# Patient Record
Sex: Male | Born: 1960
Health system: Southern US, Community
[De-identification: ages and names within clinical notes are randomized; demographics above are authoritative.]

## PROBLEM LIST (undated history)

## (undated) DIAGNOSIS — E119 Type 2 diabetes mellitus without complications: Secondary | ICD-10-CM

## (undated) DIAGNOSIS — N186 End stage renal disease: Secondary | ICD-10-CM

## (undated) DIAGNOSIS — I482 Chronic atrial fibrillation, unspecified: Secondary | ICD-10-CM

## (undated) DIAGNOSIS — N189 Chronic kidney disease, unspecified: Secondary | ICD-10-CM

## (undated) DIAGNOSIS — I509 Heart failure, unspecified: Secondary | ICD-10-CM

## (undated) DIAGNOSIS — E785 Hyperlipidemia, unspecified: Secondary | ICD-10-CM

## (undated) DIAGNOSIS — Z992 Dependence on renal dialysis: Secondary | ICD-10-CM

## (undated) DIAGNOSIS — I10 Essential (primary) hypertension: Secondary | ICD-10-CM

## (undated) DIAGNOSIS — T884XXA Failed or difficult intubation, initial encounter: Secondary | ICD-10-CM

## (undated) DIAGNOSIS — L03116 Cellulitis of left lower limb: Secondary | ICD-10-CM

## (undated) DIAGNOSIS — E118 Type 2 diabetes mellitus with unspecified complications: Secondary | ICD-10-CM

## (undated) DIAGNOSIS — D631 Anemia in chronic kidney disease: Secondary | ICD-10-CM

## (undated) HISTORY — DX: Essential (primary) hypertension: I10

## (undated) HISTORY — DX: Type 2 diabetes mellitus without complications: E11.9

## (undated) HISTORY — PX: WOUND DEBRIDEMENT: SHX247

## (undated) HISTORY — DX: Hyperlipidemia, unspecified: E78.5

## (undated) HISTORY — DX: Chronic kidney disease, unspecified: N18.9

## (undated) HISTORY — DX: Heart failure, unspecified: I50.9

---

## 2014-01-09 IMAGING — CR L-SPINE 2-3 VWS
1 series · 3 of 3 positions shown · non-contrast
Comparison: No prior study available for comparison.

HISTORY: Back pain. Disability evaluation.
TECHNIQUE: Lumbar spine, 3 views

[Series 1: view not recorded · 0.17mm/px · 3 of 3 slices shown]
[im 1/3]
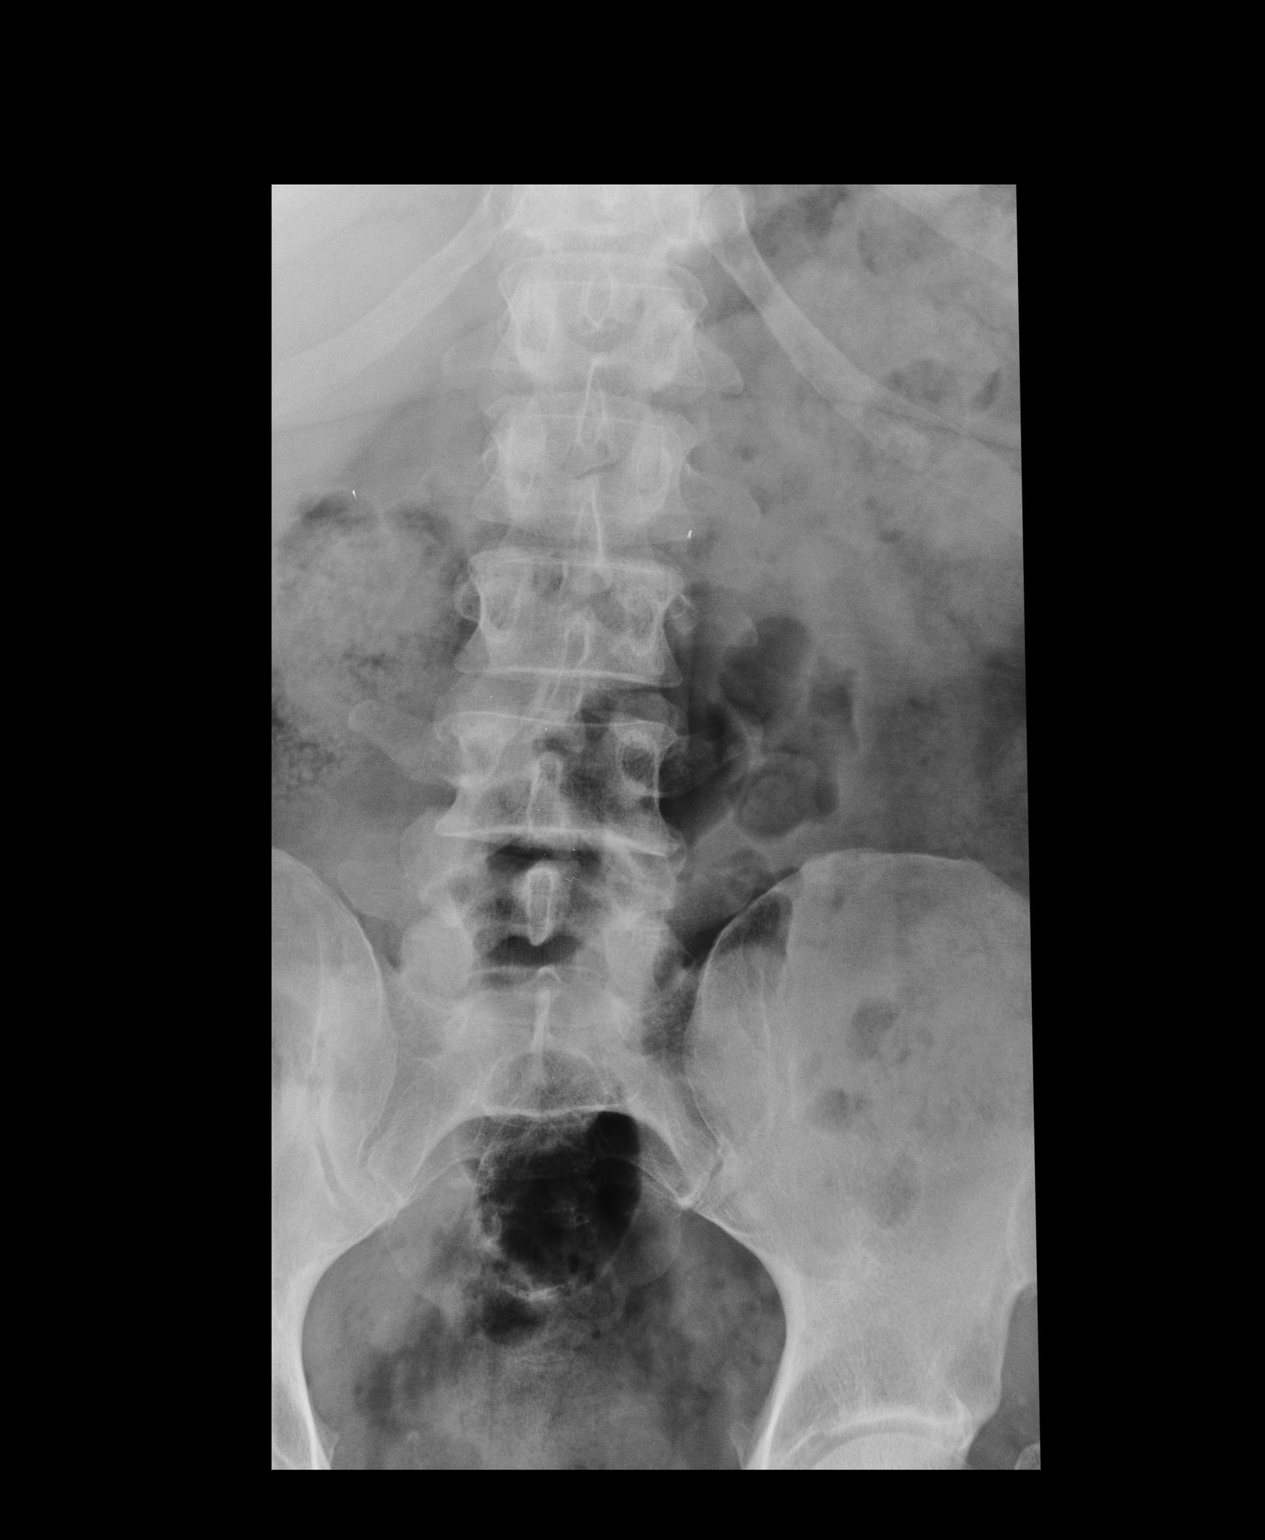
[im 2/3]
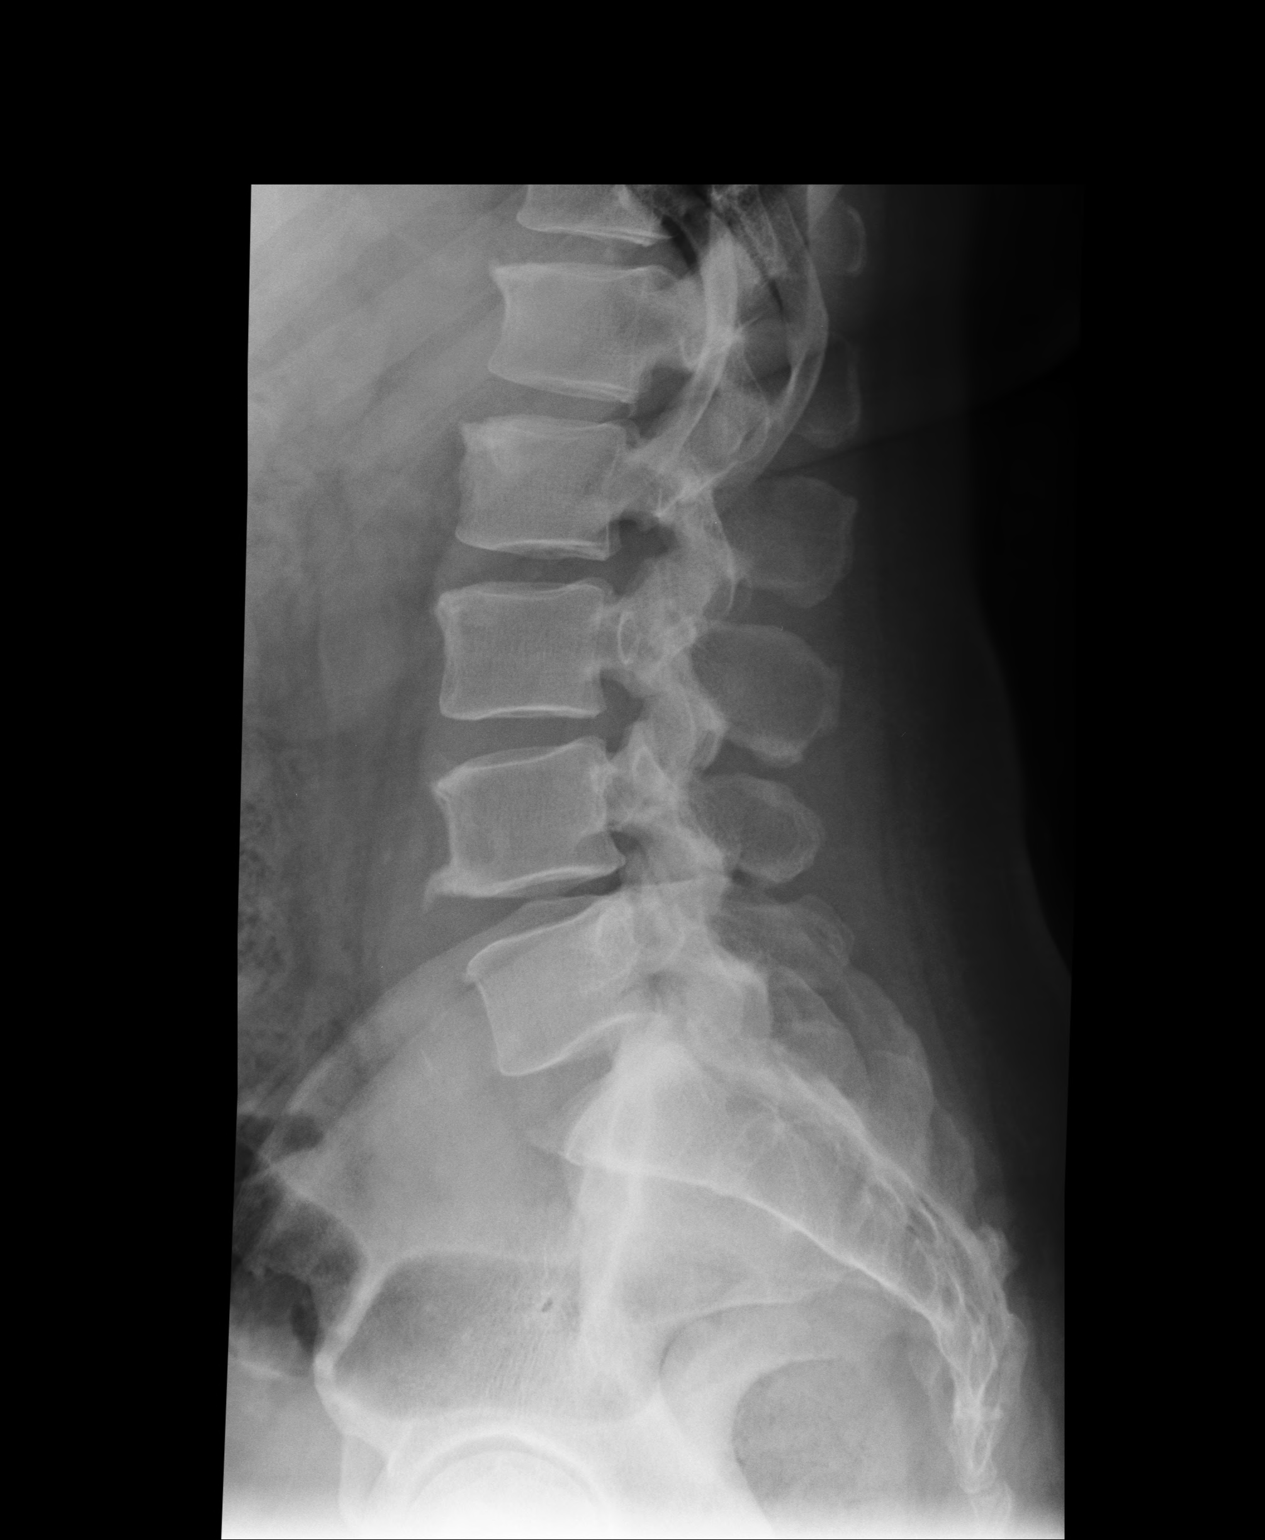
[im 3/3]
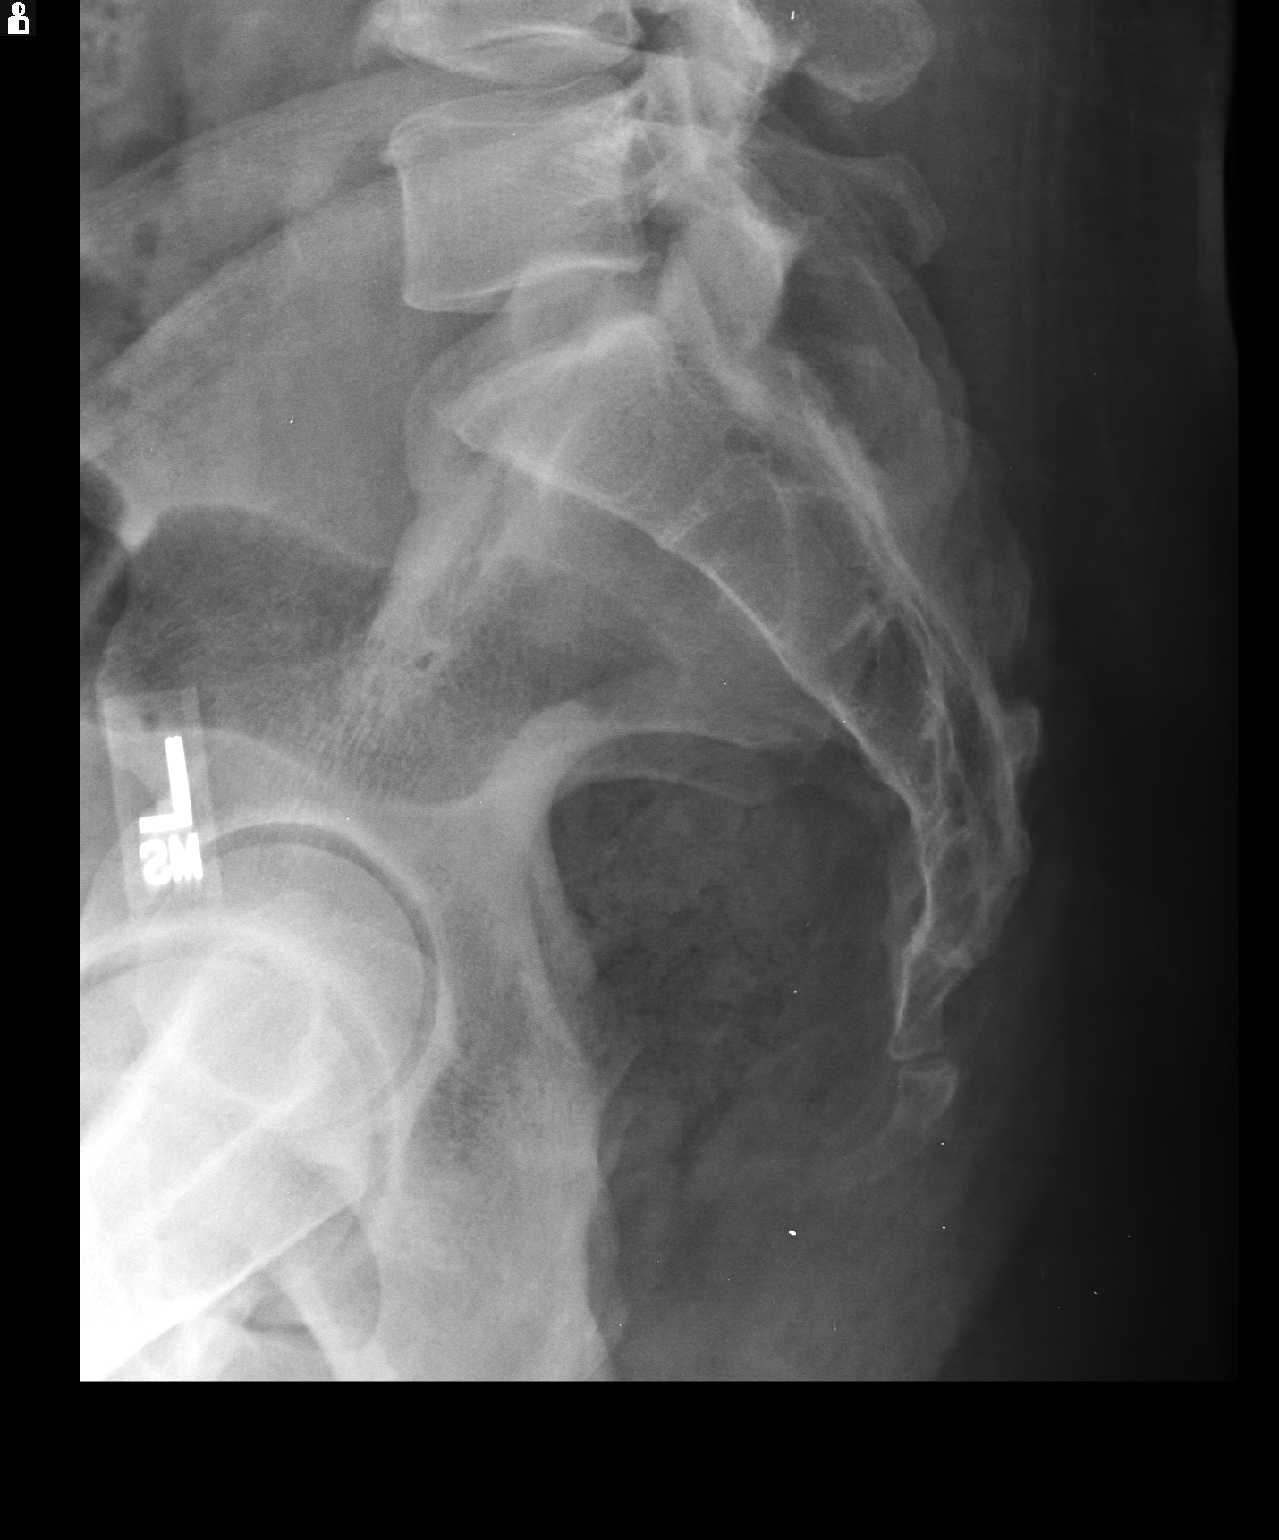

[3 of 3 positions shown; findings below may reference images not displayed]

FINDINGS: [<There are five lumbar segments.  There is no fracture, spondylosis or spondylolisthesis.  The disc spaces notable for anterior endplate spurring but maintenance of disc space at T12-L1, L1-2 and L4-5 consistent spondylosis deformans. Remaining exam appears within normal limits.
IMPRESSION: 1. Spondylosis deformans of the lower thoracic, upper lumbar and lower lumbar spine as described.

2. No convincing fractures or evidence for significant arthritic change otherwise.

## 2019-07-25 DIAGNOSIS — D631 Anemia in chronic kidney disease: Secondary | ICD-10-CM | POA: Insufficient documentation

## 2019-07-25 DIAGNOSIS — I739 Peripheral vascular disease, unspecified: Secondary | ICD-10-CM | POA: Insufficient documentation

## 2019-07-25 DIAGNOSIS — N189 Chronic kidney disease, unspecified: Secondary | ICD-10-CM | POA: Insufficient documentation

## 2019-07-25 DIAGNOSIS — N2581 Secondary hyperparathyroidism of renal origin: Secondary | ICD-10-CM | POA: Insufficient documentation

## 2019-07-25 DIAGNOSIS — N186 End stage renal disease: Secondary | ICD-10-CM | POA: Insufficient documentation

## 2019-09-01 DIAGNOSIS — I132 Hypertensive heart and chronic kidney disease with heart failure and with stage 5 chronic kidney disease, or end stage renal disease: Secondary | ICD-10-CM | POA: Insufficient documentation

## 2019-09-01 DIAGNOSIS — E669 Obesity, unspecified: Secondary | ICD-10-CM | POA: Insufficient documentation

## 2019-09-01 DIAGNOSIS — N185 Chronic kidney disease, stage 5: Secondary | ICD-10-CM | POA: Insufficient documentation

## 2019-09-01 DIAGNOSIS — I2510 Atherosclerotic heart disease of native coronary artery without angina pectoris: Secondary | ICD-10-CM | POA: Insufficient documentation

## 2019-09-14 DIAGNOSIS — Z23 Encounter for immunization: Secondary | ICD-10-CM | POA: Diagnosis not present

## 2019-09-14 DIAGNOSIS — E8770 Fluid overload, unspecified: Secondary | ICD-10-CM | POA: Diagnosis not present

## 2019-09-14 DIAGNOSIS — D689 Coagulation defect, unspecified: Secondary | ICD-10-CM | POA: Diagnosis not present

## 2019-09-14 DIAGNOSIS — N186 End stage renal disease: Secondary | ICD-10-CM | POA: Diagnosis not present

## 2019-09-14 DIAGNOSIS — Z992 Dependence on renal dialysis: Secondary | ICD-10-CM | POA: Diagnosis not present

## 2019-09-14 DIAGNOSIS — N2581 Secondary hyperparathyroidism of renal origin: Secondary | ICD-10-CM | POA: Diagnosis not present

## 2019-09-16 DIAGNOSIS — E8770 Fluid overload, unspecified: Secondary | ICD-10-CM | POA: Diagnosis not present

## 2019-09-16 DIAGNOSIS — Z992 Dependence on renal dialysis: Secondary | ICD-10-CM | POA: Diagnosis not present

## 2019-09-16 DIAGNOSIS — Z23 Encounter for immunization: Secondary | ICD-10-CM | POA: Diagnosis not present

## 2019-09-16 DIAGNOSIS — N186 End stage renal disease: Secondary | ICD-10-CM | POA: Diagnosis not present

## 2019-09-16 DIAGNOSIS — D689 Coagulation defect, unspecified: Secondary | ICD-10-CM | POA: Diagnosis not present

## 2019-09-16 DIAGNOSIS — N2581 Secondary hyperparathyroidism of renal origin: Secondary | ICD-10-CM | POA: Diagnosis not present

## 2019-09-19 DIAGNOSIS — Z23 Encounter for immunization: Secondary | ICD-10-CM | POA: Diagnosis not present

## 2019-09-19 DIAGNOSIS — D689 Coagulation defect, unspecified: Secondary | ICD-10-CM | POA: Diagnosis not present

## 2019-09-19 DIAGNOSIS — Z992 Dependence on renal dialysis: Secondary | ICD-10-CM | POA: Diagnosis not present

## 2019-09-19 DIAGNOSIS — N2581 Secondary hyperparathyroidism of renal origin: Secondary | ICD-10-CM | POA: Diagnosis not present

## 2019-09-19 DIAGNOSIS — N186 End stage renal disease: Secondary | ICD-10-CM | POA: Diagnosis not present

## 2019-09-19 DIAGNOSIS — E8770 Fluid overload, unspecified: Secondary | ICD-10-CM | POA: Diagnosis not present

## 2019-09-20 DIAGNOSIS — N2581 Secondary hyperparathyroidism of renal origin: Secondary | ICD-10-CM | POA: Diagnosis not present

## 2019-09-20 DIAGNOSIS — N186 End stage renal disease: Secondary | ICD-10-CM | POA: Diagnosis not present

## 2019-09-20 DIAGNOSIS — E8770 Fluid overload, unspecified: Secondary | ICD-10-CM | POA: Diagnosis not present

## 2019-09-20 DIAGNOSIS — Z992 Dependence on renal dialysis: Secondary | ICD-10-CM | POA: Diagnosis not present

## 2019-09-20 DIAGNOSIS — E8779 Other fluid overload: Secondary | ICD-10-CM | POA: Diagnosis not present

## 2019-09-23 DIAGNOSIS — D689 Coagulation defect, unspecified: Secondary | ICD-10-CM | POA: Diagnosis not present

## 2019-09-23 DIAGNOSIS — E8770 Fluid overload, unspecified: Secondary | ICD-10-CM | POA: Diagnosis not present

## 2019-09-23 DIAGNOSIS — N2581 Secondary hyperparathyroidism of renal origin: Secondary | ICD-10-CM | POA: Diagnosis not present

## 2019-09-23 DIAGNOSIS — Z23 Encounter for immunization: Secondary | ICD-10-CM | POA: Diagnosis not present

## 2019-09-23 DIAGNOSIS — Z992 Dependence on renal dialysis: Secondary | ICD-10-CM | POA: Diagnosis not present

## 2019-09-23 DIAGNOSIS — N186 End stage renal disease: Secondary | ICD-10-CM | POA: Diagnosis not present

## 2019-09-25 DIAGNOSIS — Z992 Dependence on renal dialysis: Secondary | ICD-10-CM | POA: Diagnosis not present

## 2019-09-25 DIAGNOSIS — N186 End stage renal disease: Secondary | ICD-10-CM | POA: Diagnosis not present

## 2019-09-25 DIAGNOSIS — E8770 Fluid overload, unspecified: Secondary | ICD-10-CM | POA: Diagnosis not present

## 2019-09-25 DIAGNOSIS — E8779 Other fluid overload: Secondary | ICD-10-CM | POA: Diagnosis not present

## 2019-09-25 DIAGNOSIS — N2581 Secondary hyperparathyroidism of renal origin: Secondary | ICD-10-CM | POA: Diagnosis not present

## 2019-09-26 DIAGNOSIS — D689 Coagulation defect, unspecified: Secondary | ICD-10-CM | POA: Diagnosis not present

## 2019-09-26 DIAGNOSIS — Z992 Dependence on renal dialysis: Secondary | ICD-10-CM | POA: Diagnosis not present

## 2019-09-26 DIAGNOSIS — Z23 Encounter for immunization: Secondary | ICD-10-CM | POA: Diagnosis not present

## 2019-09-26 DIAGNOSIS — N2581 Secondary hyperparathyroidism of renal origin: Secondary | ICD-10-CM | POA: Diagnosis not present

## 2019-09-26 DIAGNOSIS — E8770 Fluid overload, unspecified: Secondary | ICD-10-CM | POA: Diagnosis not present

## 2019-09-26 DIAGNOSIS — N186 End stage renal disease: Secondary | ICD-10-CM | POA: Diagnosis not present

## 2019-09-28 DIAGNOSIS — D689 Coagulation defect, unspecified: Secondary | ICD-10-CM | POA: Diagnosis not present

## 2019-09-28 DIAGNOSIS — E8770 Fluid overload, unspecified: Secondary | ICD-10-CM | POA: Diagnosis not present

## 2019-09-28 DIAGNOSIS — Z23 Encounter for immunization: Secondary | ICD-10-CM | POA: Diagnosis not present

## 2019-09-28 DIAGNOSIS — N186 End stage renal disease: Secondary | ICD-10-CM | POA: Diagnosis not present

## 2019-09-28 DIAGNOSIS — Z992 Dependence on renal dialysis: Secondary | ICD-10-CM | POA: Diagnosis not present

## 2019-09-28 DIAGNOSIS — N2581 Secondary hyperparathyroidism of renal origin: Secondary | ICD-10-CM | POA: Diagnosis not present

## 2019-09-29 DIAGNOSIS — E8779 Other fluid overload: Secondary | ICD-10-CM | POA: Diagnosis not present

## 2019-09-29 DIAGNOSIS — N2581 Secondary hyperparathyroidism of renal origin: Secondary | ICD-10-CM | POA: Diagnosis not present

## 2019-09-29 DIAGNOSIS — E8770 Fluid overload, unspecified: Secondary | ICD-10-CM | POA: Diagnosis not present

## 2019-09-29 DIAGNOSIS — Z992 Dependence on renal dialysis: Secondary | ICD-10-CM | POA: Diagnosis not present

## 2019-09-29 DIAGNOSIS — N186 End stage renal disease: Secondary | ICD-10-CM | POA: Diagnosis not present

## 2019-09-30 DIAGNOSIS — D689 Coagulation defect, unspecified: Secondary | ICD-10-CM | POA: Diagnosis not present

## 2019-09-30 DIAGNOSIS — Z992 Dependence on renal dialysis: Secondary | ICD-10-CM | POA: Diagnosis not present

## 2019-09-30 DIAGNOSIS — N186 End stage renal disease: Secondary | ICD-10-CM | POA: Diagnosis not present

## 2019-09-30 DIAGNOSIS — N2581 Secondary hyperparathyroidism of renal origin: Secondary | ICD-10-CM | POA: Diagnosis not present

## 2019-09-30 DIAGNOSIS — Z23 Encounter for immunization: Secondary | ICD-10-CM | POA: Diagnosis not present

## 2019-09-30 DIAGNOSIS — E8770 Fluid overload, unspecified: Secondary | ICD-10-CM | POA: Diagnosis not present

## 2019-10-02 DIAGNOSIS — D689 Coagulation defect, unspecified: Secondary | ICD-10-CM | POA: Diagnosis not present

## 2019-10-02 DIAGNOSIS — Z992 Dependence on renal dialysis: Secondary | ICD-10-CM | POA: Diagnosis not present

## 2019-10-02 DIAGNOSIS — N186 End stage renal disease: Secondary | ICD-10-CM | POA: Diagnosis not present

## 2019-10-02 DIAGNOSIS — N2581 Secondary hyperparathyroidism of renal origin: Secondary | ICD-10-CM | POA: Diagnosis not present

## 2019-10-02 DIAGNOSIS — Z23 Encounter for immunization: Secondary | ICD-10-CM | POA: Diagnosis not present

## 2019-10-02 DIAGNOSIS — E8770 Fluid overload, unspecified: Secondary | ICD-10-CM | POA: Diagnosis not present

## 2019-10-03 DIAGNOSIS — D689 Coagulation defect, unspecified: Secondary | ICD-10-CM | POA: Diagnosis not present

## 2019-10-03 DIAGNOSIS — N186 End stage renal disease: Secondary | ICD-10-CM | POA: Diagnosis not present

## 2019-10-03 DIAGNOSIS — E8770 Fluid overload, unspecified: Secondary | ICD-10-CM | POA: Diagnosis not present

## 2019-10-03 DIAGNOSIS — Z23 Encounter for immunization: Secondary | ICD-10-CM | POA: Diagnosis not present

## 2019-10-03 DIAGNOSIS — Z992 Dependence on renal dialysis: Secondary | ICD-10-CM | POA: Diagnosis not present

## 2019-10-03 DIAGNOSIS — N2581 Secondary hyperparathyroidism of renal origin: Secondary | ICD-10-CM | POA: Diagnosis not present

## 2019-10-04 ENCOUNTER — Other Ambulatory Visit: Payer: Self-pay

## 2019-10-04 ENCOUNTER — Encounter: Payer: Self-pay | Admitting: Nurse Practitioner

## 2019-10-04 ENCOUNTER — Ambulatory Visit (INDEPENDENT_AMBULATORY_CARE_PROVIDER_SITE_OTHER): Payer: Medicare Other | Admitting: Nurse Practitioner

## 2019-10-04 ENCOUNTER — Telehealth: Payer: Self-pay | Admitting: Nurse Practitioner

## 2019-10-04 VITALS — BP 112/76 | HR 102 | Temp 98.9°F | Ht 70.5 in | Wt 281.6 lb

## 2019-10-04 DIAGNOSIS — F5101 Primary insomnia: Secondary | ICD-10-CM

## 2019-10-04 DIAGNOSIS — G894 Chronic pain syndrome: Secondary | ICD-10-CM | POA: Insufficient documentation

## 2019-10-04 DIAGNOSIS — G4700 Insomnia, unspecified: Secondary | ICD-10-CM | POA: Insufficient documentation

## 2019-10-04 DIAGNOSIS — E1122 Type 2 diabetes mellitus with diabetic chronic kidney disease: Secondary | ICD-10-CM | POA: Insufficient documentation

## 2019-10-04 DIAGNOSIS — Z992 Dependence on renal dialysis: Secondary | ICD-10-CM

## 2019-10-04 DIAGNOSIS — I120 Hypertensive chronic kidney disease with stage 5 chronic kidney disease or end stage renal disease: Secondary | ICD-10-CM | POA: Diagnosis not present

## 2019-10-04 DIAGNOSIS — I110 Hypertensive heart disease with heart failure: Secondary | ICD-10-CM | POA: Diagnosis not present

## 2019-10-04 DIAGNOSIS — N186 End stage renal disease: Secondary | ICD-10-CM

## 2019-10-04 DIAGNOSIS — E662 Morbid (severe) obesity with alveolar hypoventilation: Secondary | ICD-10-CM

## 2019-10-04 DIAGNOSIS — Z7689 Persons encountering health services in other specified circumstances: Secondary | ICD-10-CM | POA: Diagnosis not present

## 2019-10-04 DIAGNOSIS — D631 Anemia in chronic kidney disease: Secondary | ICD-10-CM

## 2019-10-04 DIAGNOSIS — N2581 Secondary hyperparathyroidism of renal origin: Secondary | ICD-10-CM

## 2019-10-04 NOTE — Chronic Care Management (AMB) (Signed)
  Chronic Care Management   Outreach Note  10/04/2019 Name: Khalil Szczepanik MRN: [id number] DOB: Mar 03, 1961  Tildon Silveria is a 59 y.o. year old male who is a primary care patient of Cannady, Barbaraann Faster, NP. I reached out to Windle Guard by phone today in response to a referral sent by Mr. Fredrick Argote's PCP, [name] NP     An unsuccessful telephone outreach was attempted today. The patient was referred to the case management team for assistance with care management and care coordination.   Follow Up Plan: A HIPPA compliant phone message was left for the patient providing contact information and requesting a return call.  The care management team will reach out to the patient again over the next 7 days.  If patient returns call to provider office, please advise to call [facility]  at [place], [place], [place], [place] [address] Direct Dial: [phone] [email or web address]: [email or web address]

## 2019-10-04 NOTE — Assessment & Plan Note (Addendum)
Chronic, ongoing.  No past cardiology visits per patient.  At this time will continue current medication regimen and adjust as needed.  BP at goal today.  Recommend he monitor BP three days a week at home and document + focus on DASH diet.  Cardiology referral placed.  Return to office in 4 weeks and will obtain updated labs -- recent labs from dialysis center reviewed -- he brought to visit.  Recommend: - Reminded to call for an overnight weight gain of >2 pounds or a weekly weight weight of >5 pounds - not adding salt to his food and has been reading food labels. Reviewed the importance of keeping daily sodium intake to 2000mg  daily

## 2019-10-04 NOTE — Assessment & Plan Note (Addendum)
Chronic, ongoing with recent A1C with dialysis in June 2021 -- 6.4%.  At goal.  Continue current medication regimen and adjust as needed.  Recommend he monitor BS at least a couple times a day and document for provider + focus on diabetic diet.  CCM referral.  Return to office in 4 weeks and will obtain updated labs -- recent labs from dialysis center reviewed -- he brought to visit.

## 2019-10-04 NOTE — Assessment & Plan Note (Signed)
Chronic, ongoing, followed by nephrology and dialysis teams.  Continue current medication regimen as prescribed by them.  Attempt to obtain recent notes from [place] [facility].  Return to office in 4 weeks and will obtain updated labs -- recent labs from dialysis center reviewed -- he brought to visit.

## 2019-10-04 NOTE — Patient Instructions (Signed)

## 2019-10-04 NOTE — Assessment & Plan Note (Signed)
BMI 39.83 with ESRD and T2DM.  Recommended eating smaller high protein, low fat meals more frequently and exercising 30 mins a day 5 times a week with a goal of 10-15lb weight loss in the next 3 months. Patient voiced their understanding and motivation to adhere to these recommendations.

## 2019-10-04 NOTE — Assessment & Plan Note (Addendum)
Chronic, ongoing, followed by nephrology and dialysis teams.  Unsure about diet compliance as ate [name] today and when asked he reported that is not high sodium.  Continue current medication regimen as prescribed by them.  Attempt to obtain recent notes from [facility].  Return to office in 4 weeks and will obtain updated labs -- recent labs from dialysis center reviewed -- he brought to visit.

## 2019-10-04 NOTE — Assessment & Plan Note (Signed)
Chronic, ongoing with ESRD.  Continue collaboration with dialysis team and nephrology, attempt to obtain recent notes from [facility]. 

## 2019-10-04 NOTE — Assessment & Plan Note (Signed)
Chronic, ongoing, followed by nephrology and dialysis teams.  Continue current medication regimen as prescribed by them.  Attempt to obtain recent notes from [facility].  Return to office in 4 weeks and will obtain updated labs -- recent labs from dialysis center reviewed -- he brought to visit.

## 2019-10-04 NOTE — Progress Notes (Signed)
New Patient Office Visit  Subjective:  [name], male    DOB: 08-04-1960  Age: 59 y.o. MRN: [id number]  CC:  Chief Complaint  Patient presents with  . Establish Care    pt wants to discuss having a prescription for either Cialis or Viagra    HPI Adam Howell presents for new patient visit to establish care.  Introduced to Designer, jewellery role and practice setting.  All questions answered.  Discussed provider/patient relationship and expectations.  Moved here from [place], no current records available. Was followed by PCP there, Dr. Dr. Virgina Howell at [facility].  HYPERTENSION / HYPERLIPIDEMIA/HF Taking Losartan 100 MG daily, Eliquis, Amiodarone, Carvedilol 3.125 MG BID, Procardia 30 MG daily, Lasix 80 MG BID, Renvela, Imdur, and Bumex.  He would like Viagra or Cialis for ED, reporting difficulty attaining an erection.  Discussed at length with him risks of these medications with his underlying health issues, he agrees with cardiology referral as reports never having seen a heart provider. Satisfied with current treatment? yes Duration of hypertension: chronic BP monitoring frequency: not checking BP range:  BP medication side effects: no Duration of hyperlipidemia: chronic Cholesterol medication side effects: no Cholesterol supplements: none Medication compliance: good compliance Aspirin: no Recent stressors: no Recurrent headaches: no Visual changes: no Palpitations: no Dyspnea: no Chest pain: no Lower extremity edema: no Dizzy/lightheaded: no   DIABETES Diagnosed many years ago.  Continues Levemir 3 units daily.  Has labs at dialysis and recent A1C on his records that he brings with him is 6.4% in June 2021.  Range since August 2020 -- 5.6 to 6.8%. Hypoglycemic episodes:yes Polydipsia/polyuria: no Visual disturbance: no Chest pain: no Paresthesias: no Glucose Monitoring: yes  Accucheck frequency: once a day  Fasting glucose: <130  Post  prandial:  Evening:  Before meals: Taking Insulin?: yes  Long acting insulin: Levemir 3 units  Short acting insulin: Blood Pressure Monitoring: not checking Retinal Examination: Not up to Date Foot Exam: Not up to Date Pneumovax: Up to Date was given at dialysis Influenza: Up to Date Aspirin: no   CHRONIC KIDNEY DISEASE End stage renal disease and obtains dialysis.  Goes to dialysis Tuesday, Thursday, and Saturday.  He reports taking Norco, has been on long term, for post dialysis discomfort.   He requests refills today, discussed office policy with him.  Reviewed PDMP last fill Ambien 08/27/19 for 30 tablets and Norco 07/11/19 for 60 tablets by Adam Howell in [place].  For lunch today he ate [name]. CKD status: stable Medications renally dose: yes Previous renal evaluation: yes Pneumovax:  Up to Date Influenza Vaccine:  Up to Date   INSOMNIA Taking Ambien 10 MG nightly.  Last fill on PDMP review 08/27/19.   Duration: chronic Satisfied with sleep quality: yes Difficulty falling asleep: yes Difficulty staying asleep: no Waking a few hours after sleep onset: no Early morning awakenings: no Daytime hypersomnolence: no Wakes feeling refreshed: yes Good sleep hygiene: yes Apnea: no Snoring: no Depressed/anxious mood: no Recent stress: no Restless legs/nocturnal leg cramps: no Chronic pain/arthritis: no History of sleep study: no Treatments attempted: [place]     Past Medical History:  Diagnosis Date  . Chronic kidney disease   . Congestive heart failure ([facility])   . Diabetes mellitus without complication ([facility])   . Hyperlipidemia   . Hypertension     History reviewed. No pertinent surgical history.  Family History  Problem Relation Age of Onset  . Heart disease Mother   .  Heart disease Father   . Hypertension Daughter     Social History   Socioeconomic History  . Marital status: Single    Spouse name: Not on file  . Number of children: Not on file  .  Years of education: Not on file  . Highest education level: Not on file  Occupational History  . Not on file  Tobacco Use  . Smoking status: Never Smoker  . Smokeless tobacco: Never Used  Vaping Use  . Vaping Use: Never used  Substance and Sexual Activity  . Alcohol use: Not Currently  . Drug use: Never  . Sexual activity: Not Currently  Other Topics Concern  . Not on file  Social History Narrative  . Not on file   Social Determinants of Health   Financial Resource Strain: Low Risk   . Difficulty of Paying Living Expenses: Not hard at all  Food Insecurity: No Food Insecurity  . Worried About Charity fundraiser in the Last Year: Never true  . Ran Out of Food in the Last Year: Never true  Transportation Needs: No Transportation Needs  . Lack of Transportation (Medical): No  . Lack of Transportation (Non-Medical): No  Physical Activity: Sufficiently Active  . Days of Exercise per Week: 7 days  . Minutes of Exercise per Session: 30 min  Stress: No Stress Concern Present  . Feeling of Stress : Not at all  Social Connections: Unknown  . Frequency of Communication with Friends and Family: Three times a week  . Frequency of Social Gatherings with Friends and Family: Three times a week  . Attends Religious Services: Never  . Active Member of Clubs or Organizations: No  . Attends Archivist Meetings: Never  . Marital Status: Patient refused  Intimate Partner Violence:   . Fear of Current or Ex-Partner:   . Emotionally Abused:   Marland Kitchen Physically Abused:   . Sexually Abused:     ROS Review of Systems  Constitutional: Negative for activity change, diaphoresis, fatigue and fever.  Respiratory: Negative for cough, chest tightness, shortness of breath and wheezing.   Cardiovascular: Negative for chest pain, palpitations and leg swelling.  Gastrointestinal: Negative.   Endocrine: Negative for polydipsia, polyphagia and polyuria.  Neurological: Negative.     Psychiatric/Behavioral: Negative.     Objective:   Today's Vitals: BP 112/76   Pulse (!) 102   Temp 98.9 F (37.2 C) (Oral)   Ht 5' 10.5" (1.791 m)   Wt 281 lb 9.6 oz (127.7 kg)   SpO2 97%   BMI 39.83 kg/m   Physical Exam Vitals and nursing note reviewed.  Constitutional:      General: He is awake. He is not in acute distress.    Appearance: He is well-developed and well-groomed. He is morbidly obese. He is not ill-appearing.  HENT:     Head: Normocephalic and atraumatic.     Right Ear: Hearing normal. No drainage.     Left Ear: Hearing normal. No drainage.  Eyes:     General: Lids are normal.        Right eye: No discharge.        Left eye: No discharge.     Conjunctiva/sclera: Conjunctivae normal.     Pupils: Pupils are equal, round, and reactive to light.  Neck:     Thyroid: No thyromegaly.     Vascular: No carotid bruit.     Trachea: Trachea normal.  Cardiovascular:     Rate and Rhythm: Normal  rate and regular rhythm.     Heart sounds: Normal heart sounds, S1 normal and S2 normal. No murmur heard.  No gallop.      Arteriovenous access: left arteriovenous access is present. Pulmonary:     Effort: Pulmonary effort is normal. No accessory muscle usage or respiratory distress.     Breath sounds: Normal breath sounds.  Abdominal:     General: Bowel sounds are normal.     Palpations: Abdomen is soft. There is no hepatomegaly or splenomegaly.  Musculoskeletal:        General: Normal range of motion.     Cervical back: Normal range of motion and neck supple.     Right lower leg: No edema.     Left lower leg: No edema.  Skin:    General: Skin is warm and dry.     Capillary Refill: Capillary refill takes less than 2 seconds.     Findings: No rash.  Neurological:     Mental Status: [name] and oriented to person, place, and time.     Deep Tendon Reflexes: Reflexes are normal and symmetric.  Psychiatric:        Attention and Perception: Attention normal.         Mood and Affect: Mood normal.        Speech: Speech normal.        Behavior: Behavior normal. Behavior is cooperative.        Thought Content: Thought content normal.        Judgment: Judgment normal.     Assessment & Plan:   Problem List Items Addressed This Visit      Cardiovascular and Mediastinum   Hypertensive heart disease with heart failure (HCC)    Chronic, ongoing.  No past cardiology visits per patient.  At this time will continue current medication regimen and adjust as needed.  BP at goal today.  Recommend he monitor BP three days a week at home and document + focus on DASH diet.  Cardiology referral placed.  Return to office in 4 weeks and will obtain updated labs -- recent labs from dialysis center reviewed -- he brought to visit.  Recommend: - Reminded to call for an overnight weight gain of >2 pounds or a weekly weight weight of >5 pounds - not adding salt to his food and has been reading food labels. Reviewed the importance of keeping daily sodium intake to 2000mg  daily        Relevant Medications   amiodarone (PACERONE) 200 MG tablet   ELIQUIS 2.5 MG TABS tablet   bumetanide (BUMEX) 1 MG tablet   carvedilol (COREG) 3.125 MG tablet   furosemide (LASIX) 80 MG tablet   isosorbide mononitrate (IMDUR) 30 MG 24 hr tablet   losartan (COZAAR) 100 MG tablet   NIFEdipine (PROCARDIA-XL/NIFEDICAL-XL) 30 MG 24 hr tablet   rosuvastatin (CRESTOR) 40 MG tablet   Other Relevant Orders   Ambulatory referral to Cardiology   Referral to Chronic Care Management Services     Respiratory   Morbid (severe) obesity with alveolar hypoventilation (HCC)    BMI 39.83 with ESRD and T2DM.  Recommended eating smaller high protein, low fat meals more frequently and exercising 30 mins a day 5 times a week with a goal of 10-15lb weight loss in the next 3 months. Patient voiced their understanding and motivation to adhere to these recommendations.       Relevant Medications   LEVEMIR  FLEXTOUCH 100 UNIT/ML FlexPen  Endocrine   Secondary hyperparathyroidism of renal origin ([place])    Chronic, ongoing with ESRD.  Continue collaboration with dialysis team and nephrology, attempt to obtain recent notes from [facility].      Relevant Orders   Referral to Chronic Care Management Services   Type 2 diabetes mellitus with ESRD (end-stage renal disease) ([facility])    Chronic, ongoing with recent A1C with dialysis in June 2021 -- 6.4%.  At goal.  Continue current medication regimen and adjust as needed.  Recommend he monitor BS at least a couple times a day and document for provider + focus on diabetic diet.  CCM referral.  Return to office in 4 weeks and will obtain updated labs -- recent labs from dialysis center reviewed -- he brought to visit.      Relevant Medications   LEVEMIR FLEXTOUCH 100 UNIT/ML FlexPen   losartan (COZAAR) 100 MG tablet   rosuvastatin (CRESTOR) 40 MG tablet   Other Relevant Orders   Ambulatory referral to Ophthalmology   Referral to Chronic Care Management Services     Genitourinary   End stage renal disease ([place])    Chronic, ongoing, followed by nephrology and dialysis teams.  Unsure about diet compliance as ate [name] today and when asked he reported that is not high sodium.  Continue current medication regimen as prescribed by them.  Attempt to obtain recent notes from [facility].  Return to office in 4 weeks and will obtain updated labs -- recent labs from dialysis center reviewed -- he brought to visit.      Relevant Orders   Referral to Chronic Care Management Services   Hypertensive kidney disease with ESRD on dialysis [facility])    Chronic, ongoing, followed by nephrology and dialysis teams.  Continue current medication regimen as prescribed by them.  Attempt to obtain recent notes from [facility].  Return to office in 4 weeks and will obtain updated labs -- recent labs from dialysis center reviewed -- he brought to visit.      Relevant Orders    Referral to Chronic Care Management Services     Other   Anemia in chronic kidney disease    Chronic, ongoing, followed by nephrology and dialysis teams.  Continue current medication regimen as prescribed by them.  Attempt to obtain recent notes from [facility].  Return to office in 4 weeks and will obtain updated labs -- recent labs from dialysis center reviewed -- he brought to visit.      Chronic pain syndrome    Ongoing, reports taking Norco only after dialysis due to discomfort.  Last refill on Norco on PDMP review was 07/11/19 by previous PCP for 60 tablets.  Discussed office policy for no controlled substances on initial visit.  Requested he sign release form for previous records to review, as not available on Epic.  Plan to obtain UDS next visit and controlled substance contract, will consider pain management referral if frequent use of Norco.  Return in 4 weeks.      Relevant Medications   gabapentin (NEURONTIN) 300 MG capsule   HYDROcodone-acetaminophen (NORCO) 10-325 MG tablet   Insomnia    Chronic, ongoing.  Last Ambien fill 08/27/19 by previous PCP.  Educated him on practice policy, no controlled substances on initial patient visit.  Educated on risks of long term Ambien use.  Will plan on controlled substance agreement and UDS next visit, if no other controlled substances noted on UDS (other than Norco which is expected) then will send in refills  and continue ongoing education on risks.  Return in 4 weeks.       Other Visit Diagnoses    Encounter to establish care    -  Primary      Outpatient Encounter Medications as of 10/04/2019  Medication Sig  . amiodarone (PACERONE) 200 MG tablet Take 200 mg by mouth 2 (two) times daily.  . bumetanide (BUMEX) 1 MG tablet Take 1 mg by mouth 2 (two) times daily.  . carvedilol (COREG) 3.125 MG tablet Take 3.125 mg by mouth 2 (two) times daily.  Marland Kitchen ELIQUIS 2.5 MG TABS tablet Take 2.5 mg by mouth in the morning and at bedtime.  . furosemide  (LASIX) 80 MG tablet Take 80 mg by mouth 2 (two) times daily.  Marland Kitchen gabapentin (NEURONTIN) 300 MG capsule Take 300 mg by mouth daily.  Marland Kitchen HYDROcodone-acetaminophen (NORCO) 10-325 MG tablet Take 1 tablet by mouth 2 (two) times daily as needed. Only takes after Dialysis  . isosorbide mononitrate (IMDUR) 30 MG 24 hr tablet Take 30 mg by mouth daily.  Marland Kitchen LEVEMIR FLEXTOUCH 100 UNIT/ML FlexPen Inject 3 Units into the skin daily.  Marland Kitchen losartan (COZAAR) 100 MG tablet Take 100 mg by mouth daily.  Marland Kitchen NIFEdipine (PROCARDIA-XL/NIFEDICAL-XL) 30 MG 24 hr tablet Take 30 mg by mouth daily.  . rosuvastatin (CRESTOR) 40 MG tablet Take 40 mg by mouth daily.  . sevelamer carbonate (RENVELA) 800 MG tablet Take 1,600 mg by mouth 3 (three) times daily.  Marland Kitchen zolpidem (AMBIEN) 10 MG tablet Take 10 mg by mouth at bedtime as needed.   No facility-administered encounter medications on file as of 10/04/2019.    Follow-up: Return in about 4 weeks (around 11/01/2019) for T2DM, HTN/HLD, Chronic Pain, Sleep.   Venita Lick, NP

## 2019-10-04 NOTE — Assessment & Plan Note (Signed)
Ongoing, reports taking Norco only after dialysis due to discomfort.  Last refill on Norco on PDMP review was 07/11/19 by previous PCP for 60 tablets.  Discussed office policy for no controlled substances on initial visit.  Requested he sign release form for previous records to review, as not available on Epic.  Plan to obtain UDS next visit and controlled substance contract, will consider pain management referral if frequent use of Norco.  Return in 4 weeks.

## 2019-10-04 NOTE — Assessment & Plan Note (Signed)
Chronic, ongoing.  Last Ambien fill 08/27/19 by previous PCP.  Educated him on practice policy, no controlled substances on initial patient visit.  Educated on risks of long term Ambien use.  Will plan on controlled substance agreement and UDS next visit, if no other controlled substances noted on UDS (other than Norco which is expected) then will send in refills and continue ongoing education on risks.  Return in 4 weeks.

## 2019-10-05 DIAGNOSIS — Z23 Encounter for immunization: Secondary | ICD-10-CM | POA: Diagnosis not present

## 2019-10-05 DIAGNOSIS — Z992 Dependence on renal dialysis: Secondary | ICD-10-CM | POA: Diagnosis not present

## 2019-10-05 DIAGNOSIS — D689 Coagulation defect, unspecified: Secondary | ICD-10-CM | POA: Diagnosis not present

## 2019-10-05 DIAGNOSIS — N2581 Secondary hyperparathyroidism of renal origin: Secondary | ICD-10-CM | POA: Diagnosis not present

## 2019-10-05 DIAGNOSIS — N186 End stage renal disease: Secondary | ICD-10-CM | POA: Diagnosis not present

## 2019-10-05 DIAGNOSIS — E8770 Fluid overload, unspecified: Secondary | ICD-10-CM | POA: Diagnosis not present

## 2019-10-05 NOTE — Chronic Care Management (AMB) (Signed)
  Chronic Care Management   Outreach Note  10/05/2019 Name: Adam Howell MRN: [id number] DOB: Apr 16, 1960  Adam Howell is a 59 y.o. year old male who is a primary care patient of Cannady, Barbaraann Faster, NP. I reached out to Windle Guard by phone today in response to a referral sent by Mr. Fredrick Sayegh's PCP, [name] NP     A second unsuccessful telephone outreach was attempted today. The patient was referred to the case management team for assistance with care management and care coordination.   Follow Up Plan: A HIPPA compliant phone message was left for the patient providing contact information and requesting a return call.  The care management team will reach out to the patient again over the next 7 days.  If patient returns call to provider office, please advise to call [facility] at [place], [place], [place], [place] [address] Direct Dial: [phone] [email or web address] Website: [email or web address]

## 2019-10-07 DIAGNOSIS — N186 End stage renal disease: Secondary | ICD-10-CM | POA: Diagnosis not present

## 2019-10-07 DIAGNOSIS — Z23 Encounter for immunization: Secondary | ICD-10-CM | POA: Diagnosis not present

## 2019-10-07 DIAGNOSIS — Z992 Dependence on renal dialysis: Secondary | ICD-10-CM | POA: Diagnosis not present

## 2019-10-07 DIAGNOSIS — E8770 Fluid overload, unspecified: Secondary | ICD-10-CM | POA: Diagnosis not present

## 2019-10-07 DIAGNOSIS — N2581 Secondary hyperparathyroidism of renal origin: Secondary | ICD-10-CM | POA: Diagnosis not present

## 2019-10-07 DIAGNOSIS — D689 Coagulation defect, unspecified: Secondary | ICD-10-CM | POA: Diagnosis not present

## 2019-10-09 DIAGNOSIS — D689 Coagulation defect, unspecified: Secondary | ICD-10-CM | POA: Diagnosis not present

## 2019-10-09 DIAGNOSIS — Z23 Encounter for immunization: Secondary | ICD-10-CM | POA: Diagnosis not present

## 2019-10-09 DIAGNOSIS — N186 End stage renal disease: Secondary | ICD-10-CM | POA: Diagnosis not present

## 2019-10-09 DIAGNOSIS — E8779 Other fluid overload: Secondary | ICD-10-CM | POA: Diagnosis not present

## 2019-10-09 DIAGNOSIS — Z992 Dependence on renal dialysis: Secondary | ICD-10-CM | POA: Diagnosis not present

## 2019-10-09 DIAGNOSIS — N2581 Secondary hyperparathyroidism of renal origin: Secondary | ICD-10-CM | POA: Diagnosis not present

## 2019-10-09 DIAGNOSIS — E8770 Fluid overload, unspecified: Secondary | ICD-10-CM | POA: Diagnosis not present

## 2019-10-10 DIAGNOSIS — E8770 Fluid overload, unspecified: Secondary | ICD-10-CM | POA: Diagnosis not present

## 2019-10-10 DIAGNOSIS — D689 Coagulation defect, unspecified: Secondary | ICD-10-CM | POA: Diagnosis not present

## 2019-10-10 DIAGNOSIS — N2581 Secondary hyperparathyroidism of renal origin: Secondary | ICD-10-CM | POA: Diagnosis not present

## 2019-10-10 DIAGNOSIS — N186 End stage renal disease: Secondary | ICD-10-CM | POA: Diagnosis not present

## 2019-10-10 DIAGNOSIS — Z23 Encounter for immunization: Secondary | ICD-10-CM | POA: Diagnosis not present

## 2019-10-10 DIAGNOSIS — Z992 Dependence on renal dialysis: Secondary | ICD-10-CM | POA: Diagnosis not present

## 2019-10-11 NOTE — Chronic Care Management (AMB) (Signed)
  Chronic Care Management   Note  10/11/2019 Name: Adam Howell MRN: [id number] DOB: 02-Oct-1960  Adam Howell is a 59 y.o. year old male who is a primary care patient of Cannady, Barbaraann Faster, NP. I reached out to Windle Guard by phone today in response to a referral sent by Adam Howell.     Adam Howell was given information about Chronic Care Management services today including:  1. CCM service includes personalized support from designated clinical staff supervised by his physician, including individualized Howell of care and coordination with other care providers 2. 24/7 contact phone numbers for assistance for urgent and routine care needs. 3. Service will only be billed when office clinical staff spend 20 minutes or more in a month to coordinate care. 4. Only one practitioner may furnish and bill the service in a calendar month. 5. The patient may stop CCM services at any time (effective at the end of the month) by phone call to the office staff. 6. The patient will be responsible for cost sharing (co-pay) of up to 20% of the service fee (after annual deductible is met).  Patient agreed to services and verbal consent obtained.   Follow up Howell: Telephone appointment with care management team member scheduled for: Pharm D 10/23/2019  RN CM 11/14/2019  Noreene Lar[address], [place], [place], [place] [address] Direct Dial: [phone] [email or web address] Website: [email or web address]

## 2019-10-12 DIAGNOSIS — N2581 Secondary hyperparathyroidism of renal origin: Secondary | ICD-10-CM | POA: Diagnosis not present

## 2019-10-12 DIAGNOSIS — E8770 Fluid overload, unspecified: Secondary | ICD-10-CM | POA: Diagnosis not present

## 2019-10-12 DIAGNOSIS — D689 Coagulation defect, unspecified: Secondary | ICD-10-CM | POA: Diagnosis not present

## 2019-10-12 DIAGNOSIS — Z23 Encounter for immunization: Secondary | ICD-10-CM | POA: Diagnosis not present

## 2019-10-12 DIAGNOSIS — N186 End stage renal disease: Secondary | ICD-10-CM | POA: Diagnosis not present

## 2019-10-12 DIAGNOSIS — Z992 Dependence on renal dialysis: Secondary | ICD-10-CM | POA: Diagnosis not present

## 2019-10-14 DIAGNOSIS — Z992 Dependence on renal dialysis: Secondary | ICD-10-CM | POA: Diagnosis not present

## 2019-10-14 DIAGNOSIS — Z23 Encounter for immunization: Secondary | ICD-10-CM | POA: Diagnosis not present

## 2019-10-14 DIAGNOSIS — I120 Hypertensive chronic kidney disease with stage 5 chronic kidney disease or end stage renal disease: Secondary | ICD-10-CM | POA: Diagnosis not present

## 2019-10-14 DIAGNOSIS — E8770 Fluid overload, unspecified: Secondary | ICD-10-CM | POA: Diagnosis not present

## 2019-10-14 DIAGNOSIS — N2581 Secondary hyperparathyroidism of renal origin: Secondary | ICD-10-CM | POA: Diagnosis not present

## 2019-10-14 DIAGNOSIS — N186 End stage renal disease: Secondary | ICD-10-CM | POA: Diagnosis not present

## 2019-10-14 DIAGNOSIS — D689 Coagulation defect, unspecified: Secondary | ICD-10-CM | POA: Diagnosis not present

## 2019-10-17 DIAGNOSIS — Z23 Encounter for immunization: Secondary | ICD-10-CM | POA: Diagnosis not present

## 2019-10-17 DIAGNOSIS — Z992 Dependence on renal dialysis: Secondary | ICD-10-CM | POA: Diagnosis not present

## 2019-10-17 DIAGNOSIS — D689 Coagulation defect, unspecified: Secondary | ICD-10-CM | POA: Diagnosis not present

## 2019-10-17 DIAGNOSIS — E8770 Fluid overload, unspecified: Secondary | ICD-10-CM | POA: Diagnosis not present

## 2019-10-17 DIAGNOSIS — N2581 Secondary hyperparathyroidism of renal origin: Secondary | ICD-10-CM | POA: Diagnosis not present

## 2019-10-17 DIAGNOSIS — N186 End stage renal disease: Secondary | ICD-10-CM | POA: Diagnosis not present

## 2019-10-18 DIAGNOSIS — N186 End stage renal disease: Secondary | ICD-10-CM | POA: Diagnosis not present

## 2019-10-18 DIAGNOSIS — Z992 Dependence on renal dialysis: Secondary | ICD-10-CM | POA: Diagnosis not present

## 2019-10-18 DIAGNOSIS — Z23 Encounter for immunization: Secondary | ICD-10-CM | POA: Diagnosis not present

## 2019-10-18 DIAGNOSIS — E8770 Fluid overload, unspecified: Secondary | ICD-10-CM | POA: Diagnosis not present

## 2019-10-18 DIAGNOSIS — D689 Coagulation defect, unspecified: Secondary | ICD-10-CM | POA: Diagnosis not present

## 2019-10-18 DIAGNOSIS — N2581 Secondary hyperparathyroidism of renal origin: Secondary | ICD-10-CM | POA: Diagnosis not present

## 2019-10-19 DIAGNOSIS — N186 End stage renal disease: Secondary | ICD-10-CM | POA: Diagnosis not present

## 2019-10-19 DIAGNOSIS — E8770 Fluid overload, unspecified: Secondary | ICD-10-CM | POA: Diagnosis not present

## 2019-10-19 DIAGNOSIS — Z23 Encounter for immunization: Secondary | ICD-10-CM | POA: Diagnosis not present

## 2019-10-19 DIAGNOSIS — D689 Coagulation defect, unspecified: Secondary | ICD-10-CM | POA: Diagnosis not present

## 2019-10-19 DIAGNOSIS — Z992 Dependence on renal dialysis: Secondary | ICD-10-CM | POA: Diagnosis not present

## 2019-10-19 DIAGNOSIS — N2581 Secondary hyperparathyroidism of renal origin: Secondary | ICD-10-CM | POA: Diagnosis not present

## 2019-10-21 DIAGNOSIS — Z23 Encounter for immunization: Secondary | ICD-10-CM | POA: Diagnosis not present

## 2019-10-21 DIAGNOSIS — E8770 Fluid overload, unspecified: Secondary | ICD-10-CM | POA: Diagnosis not present

## 2019-10-21 DIAGNOSIS — D689 Coagulation defect, unspecified: Secondary | ICD-10-CM | POA: Diagnosis not present

## 2019-10-21 DIAGNOSIS — Z992 Dependence on renal dialysis: Secondary | ICD-10-CM | POA: Diagnosis not present

## 2019-10-21 DIAGNOSIS — N2581 Secondary hyperparathyroidism of renal origin: Secondary | ICD-10-CM | POA: Diagnosis not present

## 2019-10-21 DIAGNOSIS — N186 End stage renal disease: Secondary | ICD-10-CM | POA: Diagnosis not present

## 2019-10-23 ENCOUNTER — Telehealth: Payer: Medicare Other

## 2019-10-23 NOTE — Chronic Care Management (AMB) (Deleted)
Chronic Care Management Pharmacy  Name: Adam Howell  MRN: [id number] DOB: 08/22/1960   Chief Complaint/ HPI  Adam Howell,  59 y.o. , male presents for their Initial CCM visit with the clinical pharmacist via telephone due to COVID-19 Pandemic.  PCP : Venita Lick, NP Patient Care Team: Venita Lick, NP as PCP - General (Nurse Practitioner) Vladimir Faster, [facility] (Pharmacist) Vanita Ingles, RN as Registered Nurse (General Practice)  Their chronic conditions include: Hypertension, Diabetes, Coronary Artery Disease, Chronic Kidney Disease and Insomnia   Office Visits: 10/04/19- [name], NP - initial visit establish care, referrals to cards & ophthamology. Relocated from [place].  Consult Visit: None available  Allergies  Allergen Reactions  . Tramadol Rash    Medications: Outpatient Encounter Medications as of 10/23/2019  Medication Sig  . amiodarone (PACERONE) 200 MG tablet Take 200 mg by mouth 2 (two) times daily.  . bumetanide (BUMEX) 1 MG tablet Take 1 mg by mouth 2 (two) times daily.  . carvedilol (COREG) 3.125 MG tablet Take 3.125 mg by mouth 2 (two) times daily.  Marland Kitchen ELIQUIS 2.5 MG TABS tablet Take 2.5 mg by mouth in the morning and at bedtime.  . furosemide (LASIX) 80 MG tablet Take 80 mg by mouth 2 (two) times daily.  Marland Kitchen gabapentin (NEURONTIN) 300 MG capsule Take 300 mg by mouth daily.  Marland Kitchen HYDROcodone-acetaminophen (NORCO) 10-325 MG tablet Take 1 tablet by mouth 2 (two) times daily as needed. Only takes after Dialysis  . isosorbide mononitrate (IMDUR) 30 MG 24 hr tablet Take 30 mg by mouth daily.  Marland Kitchen LEVEMIR FLEXTOUCH 100 UNIT/ML FlexPen Inject 3 Units into the skin daily.  Marland Kitchen losartan (COZAAR) 100 MG tablet Take 100 mg by mouth daily.  Marland Kitchen NIFEdipine (PROCARDIA-XL/NIFEDICAL-XL) 30 MG 24 hr tablet Take 30 mg by mouth daily.  . rosuvastatin (CRESTOR) 40 MG tablet Take 40 mg by mouth daily.  . sevelamer carbonate (RENVELA) 800 MG tablet Take 1,600 mg by  mouth 3 (three) times daily.  Marland Kitchen zolpidem (AMBIEN) 10 MG tablet Take 10 mg by mouth at bedtime as needed.   No facility-administered encounter medications on file as of 10/23/2019.         Goals Addressed   None    Diabetes   A1c goal {A1c goals:23924}  Recent Relevant Labs: Recent A1c 6/21 6.4%  Last diabetic Eye exam: No results found for: HMDIABEYEEXA  Last diabetic Foot exam: No results found for: HMDIABFOOTEX   Checking BG: {CHL HP Blood Glucose Monitoring Frequency:[phone]}  Recent FBG Readings: *** Recent pre-meal BG readings: *** Recent 2hr PP BG readings:  *** Recent HS BG readings: ***  Patient has failed these meds in past: *** Patient is currently {CHL Controlled/Uncontrolled:8476958319} on the following medications: . Lememir 3 units daily  We discussed: {CHL HP Upstream Pharmacy [phone]}  Plan  Continue {CHL HP Upstream Pharmacy Plans:[phone]}  Hypertension with ESRD on dialysis   No flowsheet data found.   BP goal is:  {CHL HP UPSTREAM Pharmacist BP [phone]}  Office blood pressures are  BP Readings from Last 3 Encounters:  10/04/19 112/76   Patient checks BP at home Not checking Patient home BP readings are ranging: ***  Patient has failed these meds in the past: *** Patient is currently {CHL Controlled/Uncontrolled:8476958319} on the following medications:  . Losartan 100mg  qd . Nifedipine er 30 mg qd . Sevelamer 800 mg tid   We discussed {CHL HP Upstream Pharmacy [phone]}  Plan  Continue {CHL  HP Upstream Pharmacy Plans:[phone]}      Heart Failure   Type: {type of heart failure:30421350}  Last ejection fraction: *** NYHA Class: {CHL HP Upstream Pharm NYHA Class:[phone]} AHA HF Stage: {CHL HP Upstream Pharm AHA HF Stage:[phone]}  Patient has failed these meds in past: *** Patient is currently {CHL Controlled/[phone]} on the following medications:   Eliquis  2.5 mg bid  Amiodarone 200 mg qd  Carvedilol 3.125mg  bid  Furosemide 80 mg bid  Bumetaide 1 mg bid?  Isosorbide Mononitrate ER 30 mg qd  We discussed {CHL HP Upstream Pharmacy [phone]}  Plan  Continue {CHL HP Upstream Pharmacy Plans:[phone]}  Atherosclerotic Heart disease, ? Hyperlipidemia   LDL goal < ***  Lipid Panel  No results found for: CHOL, TRIG, HDL, LDLCALC, LDLDIRECT  No flowsheet data found.   The ASCVD Risk score Mikey Bussing DC Jr., et al., 2013) failed to calculate for the following reasons:   Cannot find a previous HDL lab   Cannot find a previous total cholesterol lab   Patient has failed these meds in past: *** Patient is currently {CHL Controlled/[phone]} on the following medications:  . Rosuvastatin 40 mg qd  We discussed:  {CHL HP Upstream Pharmacy [phone]}  Plan  Continue {CHL HP Upstream Pharmacy Plans:[phone]}  Chronic Pain   Patient has failed these meds in past: *** Patient is currently {CHL Controlled/[phone]} on the following medications:  . Hydrocodone/apap 10/325mg  bid . Gabapentin 300 mg bid?  We discussed:  ***  Plan  Continue {CHL HP Upstream Pharmacy Plans:[phone]}  Insomnia   Patient has failed these meds in past: *** Patient is currently {CHL Controlled/Uncontrolled:([phone]} on the following medications:  Marland Kitchen Zolpidem 10 mg qhs  We discussed:  ***  Plan  Continue {CHL HP Upstream Pharmacy GOTLX:[id number]}   Medication Management   Pt uses *** pharmacy for all medications Uses pill box? {Yes or If no, why not?:20788} Pt endorses ***% compliance  We discussed: ***  Plan  {US Pharmacy CBUL:84536}    Follow up: *** month phone visit  ***

## 2019-10-24 ENCOUNTER — Ambulatory Visit: Payer: Medicare Other | Admitting: Pharmacist

## 2019-10-24 DIAGNOSIS — Z992 Dependence on renal dialysis: Secondary | ICD-10-CM | POA: Diagnosis not present

## 2019-10-24 DIAGNOSIS — E8770 Fluid overload, unspecified: Secondary | ICD-10-CM | POA: Diagnosis not present

## 2019-10-24 DIAGNOSIS — N2581 Secondary hyperparathyroidism of renal origin: Secondary | ICD-10-CM | POA: Diagnosis not present

## 2019-10-24 DIAGNOSIS — N186 End stage renal disease: Secondary | ICD-10-CM

## 2019-10-24 DIAGNOSIS — I110 Hypertensive heart disease with heart failure: Secondary | ICD-10-CM

## 2019-10-24 DIAGNOSIS — D689 Coagulation defect, unspecified: Secondary | ICD-10-CM | POA: Diagnosis not present

## 2019-10-24 DIAGNOSIS — Z23 Encounter for immunization: Secondary | ICD-10-CM | POA: Diagnosis not present

## 2019-10-24 NOTE — Patient Instructions (Addendum)
Visit Information  It was a pleasure speaking with you today! Thank you for letting me be a part of your care team. Please call with any questions or concerns. Please obtain Blood Pressure cuff and blood glucose monitors and begin checking at home. We have sent prescriptions to [place].   Check your blood sugars twice daily - fasting, and 2 hours after supper. Record these readings so that Pam and I can review with you at our future calls. Bring these readings with you to appointments. For an A1c of <7%, we want to see fasting sugars <130 and 2 hour after meal sugars <180.   We recommend arm cuffs over wrist cuffs, wrist cuffs can be less accurate. Check your blood pressure ~3-4 times weekly. Make sure you are sitting down for at least 5 minutes before, resting calmly, with your feet flat on the floor. Write down these readings to review at future appointments.   Goals Addressed            This Visit's Progress   . PharmD CCM "My Dad doesn't take care of himself" Pt's daughter       CARE PLAN ENTRY (see longitudinal plan of care for additional care plan information)  Current Barriers:  . Chronic Disease Management support, education, and care coordination needs related to Hypertension, Diabetes, Heart Failure, Coronary Artery Disease, and chronic pain  and ESRD on dialysis.   Hypertension, Heart failure BP Readings from Last 3 Encounters:  10/04/19 112/76   . Pharmacist Clinical Goal(s): o Over the next 30 days, patient will work with PharmD and providers to achieve BP goal <140/90 . Current regimen:  o Amiodarone 200mg  bid o Eliquis 2.5mg  qhs o Bumetanide 1mg  bid o Carvedilol 3.125mg  bid o Furosemide 80 mg bid o Isosorbide Mononitrate 30 mg  o Losartan 100 mg qd o Nifedipine XL 30 mg qd . Interventions: o I originally spoke with Mr. Azzarello on 10/23/19 but he was unable to provide any information regarding his medications. [name] was not at home so we  rescheduled.  . Comprehensive medication review performed, medication list updated in electronic medical record . Inter-disciplinary care team collaboration (see longitudinal plan of care)  . Spoke with patient's daughter, [name]. She confirmed that Mr. Cravens can not read. She expressed frustration regarding Mr.Rail's lifestyle choices. She states he drinks up to 2-3 large regular Gatorades daily , regular soda and beer and mixed drinks when he sees it in the home. She reports getting calls from dialysis that "his levels are up and he's going to end up in the hospital". She states she gives him medicines but does not use a pill box. He is in charge of his own Levemir but "hates needles and doesn't take it".  She expresses concern for his health but also respects his autonomy as an adult and she has her own children to care for.Provided empathetic listening and answered all her questions. She requests "a diet coach" for patient. RNCM is scheduled to see patient later this month.I She states he needs a blood pressure monitor and a blood glucose monitor as he doesn't have either. Will coordinate with provider to send prescriptions for both meters to [facility]. . Patient self care activities - Over the next 60 days, patient will: o Obtain BP monitor Check BP 3-4 times weekly, document, and provide at future appointments o Weigh daily and call office is >2 lb change in a day or >5 lb change in a week o  Ensure daily salt intake < 2000 mg/day o  Check your blood pressure ~3-4 times weekly. Make sure you are sitting down for at least 5 minutes before, resting calmly, with your feet flat on the floor. Write down these readings to review in the future. o Reduce intake of sugary drinks and maintain fluid restrictions per dialysis recommendations. o Follow DASH eating plan o Follow up with cardiology   Hyperlipidemia Lab values not available in chart . Pharmacist Clinical Goal(s): o Over the next  60 days, patient will work with PharmD and providers to achieve LDL goal < 70 . Current regimen:  o Rosuvastatin 40 mg daily . Interventions: . Comprehensive medication review performed, medication list updated in electronic medical record . Inter-disciplinary care team collaboration (see longitudinal plan of care) . Check your blood sugars twice daily - fasting, and 2 hours after supper. Record these readings so that Pam and I can review with you at our future calls. Bring these readings with you to appointments. For an A1c of <7%, we want to see fasting sugars <130 and 2 hour after meal sugars <180.  Marland Kitchen Patient self care activities - Over the next  days, patient will: o Attend follow up appointments o Focus on diet and lifestyle measures  Diabetes Last A1c 6.4% in June  . Pharmacist Clinical Goal(s): o Over the next 60 days, patient will work with PharmD and providers to maintain A1c goal <7% . Current regimen:  o Levemir 20 units daily . Interventions: . Comprehensive medication review performed, medication list updated in electronic medical record . Inter-disciplinary care team collaboration (see longitudinal plan of care) . Patient self care activities - Over the next days, patient will: o  Check your blood sugars twice daily - fasting, and 2 hours after supper. Record these readings so that Pam RN case manager and I can review with you at our future calls. Bring these readings with you to appointments. For an A1c of <7%, we want to see fasting sugars <130 and 2 hour after meal sugars <180. document, and provide at future appointments o Contact provider with any episodes of hypoglycemia    Medication management . Pharmacist Clinical Goal(s): o Over the next 60 days, patient will work with PharmD and providers to achieve optimal medication adherence . Current pharmacy: [facility]  Initial goal documentation        Mr. Kuehl was given information about Chronic Care  Management services today including:  1. CCM service includes personalized support from designated clinical staff supervised by his physician, including individualized plan of care and coordination with other care providers 2. 24/7 contact phone numbers for assistance for urgent and routine care needs. 3. Standard insurance, coinsurance, copays and deductibles apply for chronic care management only during months in which we provide at least 20 minutes of these services. Most insurances cover these services at 100%, however patients may be responsible for any copay, coinsurance and/or deductible if applicable. This service may help you avoid the need for more expensive face-to-face services. 4. Only one practitioner may furnish and bill the service in a calendar month. 5. The patient may stop CCM services at any time (effective at the end of the month) by phone call to the office staff.  Patient agreed to services and verbal consent obtained.   The patient verbalized understanding of instructions provided today and agreed to receive a mailed copy of patient instruction and/or educational materials. Telephone follow up appointment with pharmacy team member scheduled for:  12/18/19 10AM  Junita Push. Kenton Kingfisher PharmD, BCPS Clinical Pharmacist [phone]  Managing Your Hypertension Hypertension is commonly called high blood pressure. This is when the force of your blood pressing against the walls of your arteries is too strong. Arteries are blood vessels that carry blood from your heart throughout your body. Hypertension forces the heart to work harder to pump blood, and may cause the arteries to become narrow or stiff. Having untreated or uncontrolled hypertension can cause heart attack, stroke, kidney disease, and other problems. What are blood pressure readings? A blood pressure reading consists of a higher number over a lower number. Ideally, your blood pressure should be below 120/80. The first ("top")  number is called the systolic pressure. It is a measure of the pressure in your arteries as your heart beats. The second ("bottom") number is called the diastolic pressure. It is a measure of the pressure in your arteries as the heart relaxes. What does my blood pressure reading mean? Blood pressure is classified into four stages. Based on your blood pressure reading, your health care provider may use the following stages to determine what type of treatment you need, if any. Systolic pressure and diastolic pressure are measured in a unit called mm Hg. Normal  Systolic pressure: below 299.  Diastolic pressure: below 80. Elevated  Systolic pressure: 242-683.  Diastolic pressure: below 80. Hypertension stage 1  Systolic pressure: 419-622.  Diastolic pressure: 29-79. Hypertension stage 2  Systolic pressure: 892 or above.  Diastolic pressure: 90 or above. What health risks are associated with hypertension? Managing your hypertension is an important responsibility. Uncontrolled hypertension can lead to:  A heart attack.  A stroke.  A weakened blood vessel (aneurysm).  Heart failure.  Kidney damage.  Eye damage.  Metabolic syndrome.  Memory and concentration problems. What changes can I make to manage my hypertension? Hypertension can be managed by making lifestyle changes and possibly by taking medicines. Your health care provider will help you make a plan to bring your blood pressure within a normal range. Eating and drinking   Eat a diet that is high in fiber and potassium, and low in salt (sodium), added sugar, and fat. An example eating plan is called the DASH (Dietary Approaches to Stop Hypertension) diet. To eat this way: ? Eat plenty of fresh fruits and vegetables. Try to fill half of your plate at each meal with fruits and vegetables. ? Eat whole grains, such as whole wheat pasta, Koerner rice, or whole grain bread. Fill about one quarter of your plate with whole  grains. ? Eat low-fat diary products. ? Avoid fatty cuts of meat, processed or cured meats, and poultry with skin. Fill about one quarter of your plate with lean proteins such as fish, chicken without skin, beans, eggs, and tofu. ? Avoid premade and processed foods. These tend to be higher in sodium, added sugar, and fat.  Reduce your daily sodium intake. Most people with hypertension should eat less than 1,500 mg of sodium a day.  Limit alcohol intake to no more than 1 drink a day for nonpregnant women and 2 drinks a day for men. One drink equals 12 oz of beer, 5 oz of wine, or 1 oz of hard liquor. Lifestyle  Work with your health care provider to maintain a healthy body weight, or to lose weight. Ask what an ideal weight is for you.  Get at least 30 minutes of exercise that causes your heart to beat faster (aerobic exercise) most days  of the week. Activities may include walking, swimming, or biking.  Include exercise to strengthen your muscles (resistance exercise), such as weight lifting, as part of your weekly exercise routine. Try to do these types of exercises for 30 minutes at least 3 days a week.  Do not use any products that contain nicotine or tobacco, such as cigarettes and e-cigarettes. If you need help quitting, ask your health care provider.  Control any long-term (chronic) conditions you have, such as high cholesterol or diabetes. Monitoring  Monitor your blood pressure at home as told by your health care provider. Your personal target blood pressure may vary depending on your medical conditions, your age, and other factors.  Have your blood pressure checked regularly, as often as told by your health care provider. Working with your health care provider  Review all the medicines you take with your health care provider because there may be side effects or interactions.  Talk with your health care provider about your diet, exercise habits, and other lifestyle factors that  may be contributing to hypertension.  Visit your health care provider regularly. Your health care provider can help you create and adjust your plan for managing hypertension. Will I need medicine to control my blood pressure? Your health care provider may prescribe medicine if lifestyle changes are not enough to get your blood pressure under control, and if:  Your systolic blood pressure is 130 or higher.  Your diastolic blood pressure is 80 or higher. Take medicines only as told by your health care provider. Follow the directions carefully. Blood pressure medicines must be taken as prescribed. The medicine does not work as well when you skip doses. Skipping doses also puts you at risk for problems. Contact a health care provider if:  You think you are having a reaction to medicines you have taken.  You have repeated (recurrent) headaches.  You feel dizzy.  You have swelling in your ankles.  You have trouble with your vision. Get help right away if:  You develop a severe headache or confusion.  You have unusual weakness or numbness, or you feel faint.  You have severe pain in your chest or abdomen.  You vomit repeatedly.  You have trouble breathing. Summary  Hypertension is when the force of blood pumping through your arteries is too strong. If this condition is not controlled, it may put you at risk for serious complications.  Your personal target blood pressure may vary depending on your medical conditions, your age, and other factors. For most people, a normal blood pressure is less than 120/80.  Hypertension is managed by lifestyle changes, medicines, or both. Lifestyle changes include weight loss, eating a healthy, low-sodium diet, exercising more, and limiting alcohol. This information is not intended to replace advice given to you by your health care provider. Make sure you discuss any questions you have with your health care provider. Document Revised: 06/24/2018  Document Reviewed: 01/29/2016 Elsevier Patient Education  [place].

## 2019-10-24 NOTE — Chronic Care Management (AMB) (Signed)
Chronic Care Management Pharmacy  Name: Adam Howell  MRN: [id number] DOB: May 19, 1960   Chief Complaint/ HPI  Adam Howell,  59 y.o. , male presents for their Initial CCM visit with the clinical pharmacist via telephone due to COVID-19 Pandemic.Spoke to patient's daughter, [name].  PCP : Venita Lick, NP Patient Care Team: Venita Lick, NP as PCP - General (Nurse Practitioner) Vladimir Faster, [facility] (Pharmacist) Vanita Ingles, RN as Registered Nurse (General Practice)  Their chronic conditions include: Hypertension, Hyperlipidemia, Diabetes, Atrial Fibrillation, Heart Failure, Chronic Kidney Disease and Chronic pain syndrome   Office Visits: 10/04/19-Jolene Ned Card, NP- initial visit establish care. Follow up scheduled 11/01/19  Consult Visit: Unavailable  Allergies  Allergen Reactions  . Tramadol Rash    Medications: Outpatient Encounter Medications as of 10/24/2019  Medication Sig  . amiodarone (PACERONE) 200 MG tablet Take 200 mg by mouth 2 (two) times daily.  . bumetanide (BUMEX) 1 MG tablet Take 1 mg by mouth 2 (two) times daily.  . carvedilol (COREG) 3.125 MG tablet Take 3.125 mg by mouth 2 (two) times daily.  Marland Kitchen ELIQUIS 2.5 MG TABS tablet Take 2.5 mg by mouth. Sig: Daily as presciribed.  . furosemide (LASIX) 80 MG tablet Take 80 mg by mouth 2 (two) times daily.  Marland Kitchen gabapentin (NEURONTIN) 300 MG capsule Take 300 mg by mouth 2 (two) times daily.   Marland Kitchen HYDROcodone-acetaminophen (NORCO) 10-325 MG tablet Take 1 tablet by mouth 2 (two) times daily as needed. Only takes after Dialysis (Patient not taking: Reported on 10/24/2019)  . isosorbide mononitrate (IMDUR) 30 MG 24 hr tablet Take 30 mg by mouth daily.  Marland Kitchen LEVEMIR FLEXTOUCH 100 UNIT/ML FlexPen Inject 20 Units into the skin daily. Patient is prescribed 20 units daily. Does not take with regularity per Daughter  . losartan (COZAAR) 100 MG tablet Take 100 mg by mouth daily.  Marland Kitchen NIFEdipine  (PROCARDIA-XL/NIFEDICAL-XL) 30 MG 24 hr tablet Take 30 mg by mouth daily.  . rosuvastatin (CRESTOR) 40 MG tablet Take 40 mg by mouth daily.  . sevelamer carbonate (RENVELA) 800 MG tablet Take 1,600 mg by mouth 3 (three) times daily.  Marland Kitchen zolpidem (AMBIEN) 10 MG tablet Take 10 mg by mouth at bedtime as needed. (Patient not taking: Reported on 10/24/2019)   No facility-administered encounter medications on file as of 10/24/2019.     Current Diagnosis/Assessment:    Goals Addressed            This Visit's Progress   . PharmD CCM "My Dad doesn't take care of himself" Pt's daughter       CARE PLAN ENTRY (see longitudinal plan of care for additional care plan information)  Current Barriers:  . Chronic Disease Management support, education, and care coordination needs related to Hypertension, Diabetes, Heart Failure, Coronary Artery Disease, and chronic pain  and ESRD on dialysis.   Hypertension, Heart failure BP Readings from Last 3 Encounters:  10/04/19 112/76   . Pharmacist Clinical Goal(s): o Over the next 30 days, patient will work with PharmD and providers to achieve BP goal <140/90 . Current regimen:  o Amiodarone 200mg  bid o Eliquis 2.5mg  qhs o Bumetanide 1mg  bid o Carvedilol 3.125mg  bid o Furosemide 80 mg bid o Isosorbide Mononitrate 30 mg  o Losartan 100 mg qd o Nifedipine XL 30 mg qd . Interventions: o I originally spoke with Mr. Scibilia on 10/23/19 but he was unable to provide any information regarding his medications. [name] was not at home so  we rescheduled.  . Comprehensive medication review performed, medication list updated in electronic medical record . Inter-disciplinary care team collaboration (see longitudinal plan of care)  . Spoke with patient's daughter, [name]. She confirmed that Mr. Knee can not read. She expressed frustration regarding Mr.Buddenhagen's lifestyle choices. She states he drinks up to 2-3 large regular Gatorades daily , regular soda and beer and  mixed drinks when he sees it in the home. She reports getting calls from dialysis that "his levels are up and he's going to end up in the hospital". She states she gives him medicines but does not use a pill box. He is in charge of his own Levemir but "hates needles and doesn't take it".  She expresses concern for his health but also respects his autonomy as an adult and she has her own children to care for.Provided empathetic listening and answered all her questions. She requests "a diet coach" for patient. RNCM is scheduled to see patient later this month.I She states he needs a blood pressure monitor and a blood glucose monitor as he doesn't have either. Will coordinate with provider to send prescriptions for both meters to [facility] . Patient self care activities - Over the next 60 days, patient will: o Obtain BP monitor Check BP 3-4 times weekly, document, and provide at future appointments o Weigh daily and call office is >2 lb change in a day or >5 lb change in a week o Ensure daily salt intake < 2000 mg/day o  Check your blood pressure ~3-4 times weekly. Make sure you are sitting down for at least 5 minutes before, resting calmly, with your feet flat on the floor. Write down these readings to review in the future. o Reduce intake of sugary drinks and maintain fluid restrictions per dialysis recommendations. o Follow DASH eating plan o Follow up with cardiology   Hyperlipidemia Lab values not available in chart . Pharmacist Clinical Goal(s): o Over the next 60 days, patient will work with PharmD and providers to achieve LDL goal < 70 . Current regimen:  o Rosuvastatin 40 mg daily . Interventions: . Comprehensive medication review performed, medication list updated in electronic medical record . Inter-disciplinary care team collaboration (see longitudinal plan of care) . Check your blood sugars twice daily - fasting, and 2 hours after supper. Record these readings so that Pam and  I can review with you at our future calls. Bring these readings with you to appointments. For an A1c of <7%, we want to see fasting sugars <130 and 2 hour after meal sugars <180.  Marland Kitchen Patient self care activities - Over the next  days, patient will: o Attend follow up appointments o Focus on diet and lifestyle measures  Diabetes Last A1c 6.4% in June  . Pharmacist Clinical Goal(s): o Over the next 60 days, patient will work with PharmD and providers to maintain A1c goal <7% . Current regimen:  o Levemir 20 units daily . Interventions: . Comprehensive medication review performed, medication list updated in electronic medical record . Inter-disciplinary care team collaboration (see longitudinal plan of care) . Patient self care activities - Over the next days, patient will: o  Check your blood sugars twice daily - fasting, and 2 hours after supper. Record these readings so that Pam RN case manager and I can review with you at our future calls. Bring these readings with you to appointments. For an A1c of <7%, we want to see fasting sugars <130 and 2 hour after meal sugars <  180. document, and provide at future appointments o Contact provider with any episodes of hypoglycemia    Medication management . Pharmacist Clinical Goal(s): o Over the next 60 days, patient will work with PharmD and providers to achieve optimal medication adherence . Current pharmacy: [facility]  Initial goal documentation        Diabetes   A1c goal <7%  Recent Relevant Labs: No results found for: HGBA1C, GFR, MICROALBUR  Last diabetic Eye exam: No results found for: HMDIABEYEEXA  Last diabetic Foot exam: No results found for: HMDIABFOOTEX   Checking BG: Never   Patient has failed these meds in past: Unknown Patient is currently controlled on the following medications: . Levemir 20 units daily --per daughter does not take regularly  We discussed: diet and exercise extensively, how to recognize  and treat signs of hypoglycemia and weighing daily; if you gain more than 3 pounds in one day or 5 pounds in one week call your doctor Per Adele Barthel, patient hates needles and does not take consistently. Counseled that insulin is likely the safest option for patient given ESRD. Could consider DPP 4.Recommend dietary modifications and checking fasting and post-prandial glucose consistenly to help determine true dosing needs.  Plan  Continue current medications for now Discuss with patient at next visit. Consider DPP-4 is if patient unwilling to do daily injections.  Hypertension/Heart Failure/ ESRD/ Arrythmia?   BP goal is:  <140/90  Office blood pressures are  BP Readings from Last 3 Encounters:  10/04/19 112/76   Patient checks BP at home does not check--Needs BP Howell Patient home BP readings are ranging: N/A  Patient has failed these meds in the past: unavailable in chart Patient is currently controlled on the following medications:  o Amiodarone 200mg  bid o Eliquis 2.5mg  qhs o Bumetanide 1mg  bid o Carvedilol 3.125mg  bid o Furosemide 80 mg bid o Isosorbide Mononitrate 30 mg  o Losartan 100 mg qd o Nifedipine XL 30 mg qd  No mention of arrythmia in chart but patient taking Amiodarone and Eliquis.  We discussed daughter helps patient manage medications. All meds were prescribed in [place]. Patient has been living with daughter approximately 4 months. Patient can not read per daughter. Difficult to determine if multiple agents needed to achieve control or if related to previous non-adherence. We discussed signs and symptoms of low BP and importance of checking at home given multiple medications.   Plan  Continue current medications . Await records from [place]. Will collaborate with provider to have prescriptions sent to [facility] for blood pressure monitor.    Hyperlipidemia   LDL goal < 70  Lipid Panel  No labs available in chart  Patient has failed these meds in past:  Unknoww Patient is currently on the following medications:  . Rosuvastatin 40 mg daily  We discussed:  diet and exercise extensively. Will collaborate with RN CM for help with ongoing dietary counseling  Plan  Continue current medications. Check follow-up lipid panel if records not available.   Chronic Pain   Patient has failed these meds in past: Unknown Patient is currently uncontrolled on the following medications:  . Gabapentin 300mg  bid . Hydrocodone 10/325 mg--has not had since arrival to [facility] per daughter  We discussed:  Daughter [name] very concerned that patient has not had Hydrocodone since moving to [place]. States she got him in with provider as soon as scheduling allowed. She voiced concern that pain impacts patient's daily quality of life.  Plan  Follow up with provider  at 11/01/19 appointment.    Medication Management   Pt uses [facility] pharmacy for all medications Uses pill box? No - Patient will get one through Medicaid. Pt endorses unknown% compliance  We discussed: Use of pill box to help daughter keep track of meds. Options for delivery and packaging in future after regimen established.  Plan  Obtain pill box.    Follow up: 2 month phone visit  Junita Push. Kenton Kingfisher PharmD, [facility] [phone]

## 2019-10-25 ENCOUNTER — Ambulatory Visit: Payer: Medicare Other

## 2019-10-26 ENCOUNTER — Encounter: Payer: Self-pay | Admitting: Nurse Practitioner

## 2019-10-26 DIAGNOSIS — Z992 Dependence on renal dialysis: Secondary | ICD-10-CM | POA: Diagnosis not present

## 2019-10-26 DIAGNOSIS — I5020 Unspecified systolic (congestive) heart failure: Secondary | ICD-10-CM | POA: Insufficient documentation

## 2019-10-26 DIAGNOSIS — I509 Heart failure, unspecified: Secondary | ICD-10-CM | POA: Insufficient documentation

## 2019-10-26 DIAGNOSIS — D689 Coagulation defect, unspecified: Secondary | ICD-10-CM | POA: Diagnosis not present

## 2019-10-26 DIAGNOSIS — E8770 Fluid overload, unspecified: Secondary | ICD-10-CM | POA: Diagnosis not present

## 2019-10-26 DIAGNOSIS — N186 End stage renal disease: Secondary | ICD-10-CM | POA: Diagnosis not present

## 2019-10-26 DIAGNOSIS — Z23 Encounter for immunization: Secondary | ICD-10-CM | POA: Diagnosis not present

## 2019-10-26 DIAGNOSIS — N2581 Secondary hyperparathyroidism of renal origin: Secondary | ICD-10-CM | POA: Diagnosis not present

## 2019-10-28 DIAGNOSIS — Z23 Encounter for immunization: Secondary | ICD-10-CM | POA: Diagnosis not present

## 2019-10-28 DIAGNOSIS — N186 End stage renal disease: Secondary | ICD-10-CM | POA: Diagnosis not present

## 2019-10-28 DIAGNOSIS — Z992 Dependence on renal dialysis: Secondary | ICD-10-CM | POA: Diagnosis not present

## 2019-10-28 DIAGNOSIS — D689 Coagulation defect, unspecified: Secondary | ICD-10-CM | POA: Diagnosis not present

## 2019-10-28 DIAGNOSIS — N2581 Secondary hyperparathyroidism of renal origin: Secondary | ICD-10-CM | POA: Diagnosis not present

## 2019-10-28 DIAGNOSIS — E8770 Fluid overload, unspecified: Secondary | ICD-10-CM | POA: Diagnosis not present

## 2019-11-01 ENCOUNTER — Ambulatory Visit: Payer: Medicare Other | Admitting: Nurse Practitioner

## 2019-11-02 ENCOUNTER — Telehealth: Payer: Self-pay | Admitting: Nurse Practitioner

## 2019-11-02 DIAGNOSIS — D689 Coagulation defect, unspecified: Secondary | ICD-10-CM | POA: Diagnosis not present

## 2019-11-02 DIAGNOSIS — N2581 Secondary hyperparathyroidism of renal origin: Secondary | ICD-10-CM | POA: Diagnosis not present

## 2019-11-02 DIAGNOSIS — Z23 Encounter for immunization: Secondary | ICD-10-CM | POA: Diagnosis not present

## 2019-11-02 DIAGNOSIS — N186 End stage renal disease: Secondary | ICD-10-CM | POA: Diagnosis not present

## 2019-11-02 DIAGNOSIS — E8770 Fluid overload, unspecified: Secondary | ICD-10-CM | POA: Diagnosis not present

## 2019-11-02 DIAGNOSIS — Z992 Dependence on renal dialysis: Secondary | ICD-10-CM | POA: Diagnosis not present

## 2019-11-02 NOTE — Telephone Encounter (Signed)
Copied from [place] [id number]. Topic: Medicare AWV >> Nov 02, 2019 12:22 PM Cher Nakai R wrote: Reason for CRM:  Left message for patient to call back and schedule Medicare Annual Wellness Visit (AWV) to be done virtually.  No hx of AWV eligible as of 09/14/2015  Please schedule at anytime with CFP-Nurse Health Advisor.      74 Minutes appointment   Any questions, please call me at [phone]

## 2019-11-04 DIAGNOSIS — N2581 Secondary hyperparathyroidism of renal origin: Secondary | ICD-10-CM | POA: Diagnosis not present

## 2019-11-04 DIAGNOSIS — D689 Coagulation defect, unspecified: Secondary | ICD-10-CM | POA: Diagnosis not present

## 2019-11-04 DIAGNOSIS — Z992 Dependence on renal dialysis: Secondary | ICD-10-CM | POA: Diagnosis not present

## 2019-11-04 DIAGNOSIS — Z23 Encounter for immunization: Secondary | ICD-10-CM | POA: Diagnosis not present

## 2019-11-04 DIAGNOSIS — E8770 Fluid overload, unspecified: Secondary | ICD-10-CM | POA: Diagnosis not present

## 2019-11-04 DIAGNOSIS — N186 End stage renal disease: Secondary | ICD-10-CM | POA: Diagnosis not present

## 2019-11-06 ENCOUNTER — Ambulatory Visit: Payer: Medicare Other | Admitting: Nurse Practitioner

## 2019-11-07 ENCOUNTER — Encounter: Payer: Self-pay | Admitting: Nurse Practitioner

## 2019-11-07 ENCOUNTER — Telehealth (INDEPENDENT_AMBULATORY_CARE_PROVIDER_SITE_OTHER): Payer: Medicare Other | Admitting: Nurse Practitioner

## 2019-11-07 ENCOUNTER — Telehealth: Payer: Self-pay | Admitting: Nurse Practitioner

## 2019-11-07 DIAGNOSIS — Z23 Encounter for immunization: Secondary | ICD-10-CM | POA: Diagnosis not present

## 2019-11-07 DIAGNOSIS — R05 Cough: Secondary | ICD-10-CM | POA: Diagnosis not present

## 2019-11-07 DIAGNOSIS — N2581 Secondary hyperparathyroidism of renal origin: Secondary | ICD-10-CM | POA: Diagnosis not present

## 2019-11-07 DIAGNOSIS — D689 Coagulation defect, unspecified: Secondary | ICD-10-CM | POA: Diagnosis not present

## 2019-11-07 DIAGNOSIS — R051 Acute cough: Secondary | ICD-10-CM | POA: Insufficient documentation

## 2019-11-07 DIAGNOSIS — N186 End stage renal disease: Secondary | ICD-10-CM | POA: Diagnosis not present

## 2019-11-07 DIAGNOSIS — Z992 Dependence on renal dialysis: Secondary | ICD-10-CM | POA: Diagnosis not present

## 2019-11-07 DIAGNOSIS — R059 Cough, unspecified: Secondary | ICD-10-CM

## 2019-11-07 DIAGNOSIS — E8770 Fluid overload, unspecified: Secondary | ICD-10-CM | POA: Diagnosis not present

## 2019-11-07 NOTE — Patient Instructions (Signed)

## 2019-11-07 NOTE — Telephone Encounter (Signed)
-----   Message from Venita Lick, NP sent at 11/07/2019  1:58 PM EDT ----- Please ensure he has diabetes visit schedule for next 2-3 weeks, he missed 2 recent appointments.

## 2019-11-07 NOTE — Progress Notes (Signed)
There were no vitals taken for this visit.   Subjective:    [name], male    DOB: Feb 21, 1961, 59 y.o.   MRN: [id number]  HPI: Adam Howell is a 59 y.o. male  Chief Complaint  Patient presents with  . Cough    dry cough x about 1-2 weeks    . This visit was completed via telephone due to the restrictions of the COVID-19 pandemic. All issues as above were discussed and addressed but no physical exam was performed. If it was felt that the patient should be evaluated in the office, they were directed there. The patient verbally consented to this visit. Patient was unable to complete an audio/visual visit due to telephone. Due to the catastrophic nature of the COVID-19 pandemic, this visit was done through audio contact only. . Location of the patient: home . Location of the provider: work . Those involved with this call:  . Provider: [name], DNP . CMA: Yvonna Alanis, CMA . Front Desk/Registration: Don Perking  . Time spent on call: 20 minutes with patient face to face via video conference. More than 50% of this time was spent in counseling and coordination of care. 15 minutes total spent in review of patient's record and preparation of their chart.  . I verified patient identity using two factors (patient name and date of birth). Patient consents verbally to being seen via telemedicine visit today.   COUGH Is at dialysis currently, reports having dry cough for one week.  No other symptoms.  He reports he did tell dialysis team he had a cough, but "I have had my Covid vaccines".  Denies any loss of taste or smell.  Has been using Halls which offer benefit.  Just moved here from [place].  Reports cough is not productive and no swelling, CP, or SOB.  Denies reflux symptoms. Fever: no Cough: yes Shortness of breath: no Wheezing: no Chest pain: no Chest tightness: no Chest congestion: no Nasal congestion: no Runny nose: no Post nasal drip:  no Sneezing: no Sore throat: no Swollen glands: no Sinus pressure: no Headache: no Face pain: no Toothache: no Ear pain: none Ear pressure: none Eyes red/itching:no Eye drainage/crusting: no  Vomiting: no Rash: no Fatigue: no Sick contacts: no Strep contacts: no  Context: stable Recurrent sinusitis: no Relief with OTC cold/cough medications: no  Treatments attempted: Halls     Relevant past medical, surgical, family and social history reviewed and updated as indicated. Interim medical history since our last visit reviewed. Allergies and medications reviewed and updated.  Review of Systems  Constitutional: Negative for activity change, diaphoresis, fatigue and fever.  HENT: Negative.   Respiratory: Positive for cough. Negative for chest tightness, shortness of breath and wheezing.   Cardiovascular: Negative for chest pain, palpitations and leg swelling.  Gastrointestinal: Negative.   Neurological: Negative.   Psychiatric/Behavioral: Negative.     Per HPI unless specifically indicated above     Objective:    There were no vitals taken for this visit.  Wt Readings from Last 3 Encounters:  10/04/19 281 lb 9.6 oz (127.7 kg)    Physical Exam   Unable to assess due to telephone only visit  No results found for this or any previous visit.    Assessment & Plan:   Problem List Items Addressed This Visit      Other   Cough    Present x one week with no other symptoms per his report.  Have recommended he obtain Covid testing due to current pandemic and risk factors, he reports understanding.  At this time recommend OTC medications -- may take Claritin every 48 hours (he is a dialysis patient) as needed.  May use OTC Mucinex as needed, recommend minimal use with his dialysis -- only if needed.  Continue use of Halls.  If worsening or ongoing symptoms then return to office or immediately go to ER.  Highly recommended he alert dialysis team of his cough and may need to self  quarantine until he obtains Covid testing results.         I discussed the assessment and treatment plan with the patient. The patient was provided an opportunity to ask questions and all were answered. The patient agreed with the plan and demonstrated an understanding of the instructions.   The patient was advised to call back or seek an in-person evaluation if the symptoms worsen or if the condition fails to improve as anticipated.   I provided 21+ minutes of time during this encounter.  Follow up plan: Return if symptoms worsen or fail to improve.

## 2019-11-07 NOTE — Assessment & Plan Note (Signed)
Present x one week with no other symptoms per his report.  Have recommended he obtain Covid testing due to current pandemic and risk factors, he reports understanding.  At this time recommend OTC medications -- may take Claritin every 48 hours (he is a dialysis patient) as needed.  May use OTC Mucinex as needed, recommend minimal use with his dialysis -- only if needed.  Continue use of Halls.  If worsening or ongoing symptoms then return to office or immediately go to ER.  Highly recommended he alert dialysis team of his cough and may need to self quarantine until he obtains Covid testing results.

## 2019-11-09 DIAGNOSIS — N186 End stage renal disease: Secondary | ICD-10-CM | POA: Diagnosis not present

## 2019-11-09 DIAGNOSIS — Z992 Dependence on renal dialysis: Secondary | ICD-10-CM | POA: Diagnosis not present

## 2019-11-09 DIAGNOSIS — D689 Coagulation defect, unspecified: Secondary | ICD-10-CM | POA: Diagnosis not present

## 2019-11-09 DIAGNOSIS — E8770 Fluid overload, unspecified: Secondary | ICD-10-CM | POA: Diagnosis not present

## 2019-11-09 DIAGNOSIS — Z23 Encounter for immunization: Secondary | ICD-10-CM | POA: Diagnosis not present

## 2019-11-09 DIAGNOSIS — N2581 Secondary hyperparathyroidism of renal origin: Secondary | ICD-10-CM | POA: Diagnosis not present

## 2019-11-10 ENCOUNTER — Ambulatory Visit (INDEPENDENT_AMBULATORY_CARE_PROVIDER_SITE_OTHER): Payer: Medicare Other | Admitting: Internal Medicine

## 2019-11-10 ENCOUNTER — Encounter: Payer: Self-pay | Admitting: Internal Medicine

## 2019-11-10 ENCOUNTER — Other Ambulatory Visit: Payer: Self-pay

## 2019-11-10 VITALS — BP 120/72 | HR 91 | Ht 75.0 in | Wt 286.0 lb

## 2019-11-10 DIAGNOSIS — I10 Essential (primary) hypertension: Secondary | ICD-10-CM | POA: Diagnosis not present

## 2019-11-10 DIAGNOSIS — E1122 Type 2 diabetes mellitus with diabetic chronic kidney disease: Secondary | ICD-10-CM

## 2019-11-10 DIAGNOSIS — E785 Hyperlipidemia, unspecified: Secondary | ICD-10-CM

## 2019-11-10 DIAGNOSIS — I509 Heart failure, unspecified: Secondary | ICD-10-CM | POA: Diagnosis not present

## 2019-11-10 DIAGNOSIS — I4891 Unspecified atrial fibrillation: Secondary | ICD-10-CM

## 2019-11-10 DIAGNOSIS — N186 End stage renal disease: Secondary | ICD-10-CM

## 2019-11-10 DIAGNOSIS — N529 Male erectile dysfunction, unspecified: Secondary | ICD-10-CM

## 2019-11-10 NOTE — Progress Notes (Signed)
New Outpatient Visit Date: 11/10/2019  Referring Provider: Venita Lick, NP [place],  [place] [id number]  Chief Complaint: Heart failure  HPI:  Mr. Spieler is a 59 y.o. male who is being seen today for the evaluation of hypertensive heart disease and erectile dysfunction at the request of Ms. Cannady. He has a history of hypertension, hyperlipidemia, CHF (details uncertain), Kirat Mezquita-stage renal disease, and diabetes mellitus.  He moved from [place] earlier this year and to establish with Ms. Cannady in July.  He complained of erectile dysfunction at that time and requested a PDE5 inhibitor.  However, given his medical history, further cardiology evaluation was recommended before initiating PDE5 therapy.  Mr. Wiseman does not know specifics about his cardiac history other than he was diagnosed with "congestive heart failure" and an irregular heartbeat in the past.  He does not believe that he ever underwent stress testing or cardiac catheterization.  He does not recall being told that he has atrial fibrillation, though he does not have any other explanation as to why he is on chronic anticoagulation.  He reports feeling well, denying chest pain, shortness of breath, palpitations, lightheadedness, and edema.  He is compliant with his dialysis and current medications.  He reports that his diabetes and blood pressure have been well controlled.  --------------------------------------------------------------------------------------------------  Cardiovascular History & Procedures: Cardiovascular Problems:  Heart failure (details uncertain)  Atrial fibrillation  Risk Factors:  Hypertension, hyperlipidemia, diabetes mellitus, male gender, age greater than 62, and obesity  Cath/PCI:  None available  CV Surgery:  None  EP Procedures and Devices:  None available  Non-Invasive Evaluation(s):  None  available  --------------------------------------------------------------------------------------------------  Past Medical History:  Diagnosis Date   Chronic kidney disease    Congestive heart failure ([place])    Diabetes mellitus without complication ([place])    Hyperlipidemia    Hypertension     History reviewed. No pertinent surgical history.  Current Meds  Medication Sig   amiodarone (PACERONE) 200 MG tablet Take 200 mg by mouth 2 (two) times daily.   bumetanide (BUMEX) 1 MG tablet Take 1 mg by mouth 2 (two) times daily.   carvedilol (COREG) 3.125 MG tablet Take 3.125 mg by mouth 2 (two) times daily.   ELIQUIS 2.5 MG TABS tablet Take 2.5 mg by mouth 2 (two) times daily. Sig: Daily as presciribed.   furosemide (LASIX) 80 MG tablet Take 80 mg by mouth 2 (two) times daily.   gabapentin (NEURONTIN) 300 MG capsule Take 300 mg by mouth 2 (two) times daily.    HYDROcodone-acetaminophen (NORCO) 10-325 MG tablet Take 1 tablet by mouth 2 (two) times daily as needed. Only takes after Dialysis   isosorbide mononitrate (IMDUR) 30 MG 24 hr tablet Take 30 mg by mouth daily.   LEVEMIR FLEXTOUCH 100 UNIT/ML FlexPen Inject 20 Units into the skin daily. Patient is prescribed 20 units daily. Does not take with regularity per Daughter   losartan (COZAAR) 100 MG tablet Take 100 mg by mouth daily.   NIFEdipine (PROCARDIA-XL/NIFEDICAL-XL) 30 MG 24 hr tablet Take 30 mg by mouth daily.   rosuvastatin (CRESTOR) 40 MG tablet Take 40 mg by mouth daily.   sevelamer carbonate (RENVELA) 800 MG tablet Take 1,600 mg by mouth 3 (three) times daily.   zolpidem (AMBIEN) 10 MG tablet Take 10 mg by mouth at bedtime as needed.     Allergies: Tramadol  Social History   Tobacco Use   Smoking status: Never Smoker   Smokeless tobacco: Never  Used  Vaping Use   Vaping Use: Never used  Substance Use Topics   Alcohol use: Not Currently   Drug use: Never    Family History  Problem Relation Age  of Onset   Heart failure Mother    Cirrhosis Father    Hypertension Daughter     Review of Systems: A 12-system review of systems was performed and was negative except as noted in the HPI.  --------------------------------------------------------------------------------------------------  Physical Exam: BP 120/72 (BP Location: Left Arm, Patient Position: Sitting, Cuff Size: Normal)    Pulse 91    Ht 6\' 3"  (1.905 m)    Wt 286 lb (129.7 kg)    BMI 35.75 kg/m   General: Obese man, seated comfortably in the exam room. HEENT: No conjunctival pallor or scleral icterus. Facemask in place. Neck: Supple without lymphadenopathy, thyromegaly, JVD, or HJR. No carotid bruit. Lungs: Normal work of breathing. Clear to auscultation bilaterally without wheezes or crackles. Heart: Irregularly irregular rhythm without murmurs, rubs, or gallops.  Unable to assess PMI due to body habitus. Abd: Bowel sounds present. Soft, NT/ND without hepatosplenomegaly Ext: No lower extremity edema.  Left forearm fistula present with thrill noted. Skin: Warm and dry without rash. Neuro: CNIII-XII intact. Strength and fine-touch sensation intact in upper and lower extremities bilaterally. Psych: Normal mood and affect.  EKG: Atrial fibrillation with borderline LVH, IVCD, and lateral T wave inversions.  No prior tracing available for comparison.  --------------------------------------------------------------------------------------------------  ASSESSMENT AND PLAN: Congestive heart failure: Details are uncertain as to whether this represents HFpEF or HFrEF.  We will request records from Mr. Don prior PCP in [place].  He does not recall who his cardiologist was, that he was only seen once by a heart provider.  This seems unusual as he is currently on amiodarone and apixaban.  Mr. Rybacki appears euvolemic on exam today.  I will defer making any medication changes until we have obtained additional records regarding his  prior cardiac history.  I suspect at a minimum he will need an echocardiogram.  I will continue an evidence-based heart failure regimen of carvedilol and losartan for the time being.  I am uncertain if isosorbide mononitrate was previously prescribed for antianginal therapy or part of a heart failure regimen, though it seems unusual that he is not on hydralazine in this setting.  He is also reportedly on bumetanide and furosemide, which seems redundant.  Guidance from his nephrologist would also be helpful in regard to volume management through hemodialysis and multiple loop diuretics.  Atrial fibrillation: Duration of atrial fibrillation is uncertain.  Given that he has been prescribed amiodarone and apixaban, I suspect this is not a new diagnosis.  As above, we will request outside records.  If it appears that he has been in persistent atrial fibrillation, we will need to consider cardioversion to restore sinus rhythm or discontinue amiodarone altogether (unless it is also being used for suppression of ventricular arrhythmia in the setting of a cardiomyopathy).  Current apixaban dosing is also subtherapeutic based on age, weight, and renal function.  However, before I alter this, I will review his outside records and labs in case he has had issues with bleeding at higher doses.  If this were to be the case, I think it would be reasonable to consider transitioning to warfarin to ensure adequate anticoagulation.  Hypertension: Blood pressure well controlled today.  I will defer medication change at this time.  Diabetes mellitus: No labs available, though Mr. Mixson reports that his  blood sugars have been well controlled.  Continue management per Ms. Cannady.  Hyperlipidemia: Request outside records, including most recent labs.  Continue rosuvastatin 40 mg daily for now.  Erectile dysfunction: At this time, I cannot recommend for or against initiation of a PDE5 inhibitor.  Outside records will need to  be reviewed to better understand Mr. Meuth cardiac history.  He is asymptomatic at this time without evidence of angina or decompensated heart failure.  If PDE5 inhibitor were pursued in the future, isosorbide mononitrate would need to be discontinued.  Obesity: BMI greater than 35 with multiple comorbidities (hypertension, hyperlipidemia, heart failure, and diabetes mellitus).  Weight loss encouraged through diet and exercise.  Follow-up: Return to clinic in 1 month.  Nelva Bush, MD 11/11/2019 4:52 PM

## 2019-11-10 NOTE — Patient Instructions (Signed)
Medication Instructions:  Your physician recommends that you continue on your current medications as directed. Please refer to the Current Medication list given to you today.  *If you need a refill on your cardiac medications before your next appointment, please call your pharmacy*   Lab Work: none If you have labs (blood work) drawn today and your tests are completely normal, you will receive your results only by: Marland Kitchen MyChart Message (if you have MyChart) OR . A paper copy in the mail If you have any lab test that is abnormal or we need to change your treatment, we will call you to review the results.   Testing/Procedures: Your physician has requested that you have an echocardiogram. Echocardiography is a painless test that uses sound waves to create images of your heart. It provides your doctor with information about the size and shape of your heart and how well your heart's chambers and valves are working. This procedure takes approximately one hour. There are no restrictions for this procedure. You may get an IV, if needed, to receive an ultrasound enhancing agent through to better visualize your heart.   Follow-Up: At [facility], you and your health needs are our priority.  As part of our continuing mission to provide you with exceptional heart care, we have created designated Provider Care Teams.  These Care Teams include your primary Cardiologist (physician) and Advanced Practice Providers (APPs -  Physician Assistants and Nurse Practitioners) who all work together to provide you with the care you need, when you need it.  We recommend signing up for the patient portal called "MyChart".  Sign up information is provided on this After Visit Summary.  MyChart is used to connect with patients for Virtual Visits (Telemedicine).  Patients are able to view lab/test results, encounter notes, upcoming appointments, etc.  Non-urgent messages can be sent to your provider as well.   To learn more  about what you can do with MyChart, go to [email or web address].    Your next appointment:   1 month(s)  The format for your next appointment:   In Person  Provider:    You may see DR Harrell Gave END or one of the following Advanced Practice Providers on your designated Care Team:    Murray Hodgkins, NP  Christell Faith, PA-C  Marrianne Mood, PA-C    Other Instructions Please sign release to obtain records from your PCP - Dr Virgina Norfolk in [place], [place].  Fax [phone]

## 2019-11-10 NOTE — Telephone Encounter (Signed)
Lvm X2 to make this apt.

## 2019-11-11 ENCOUNTER — Encounter: Payer: Self-pay | Admitting: Internal Medicine

## 2019-11-11 DIAGNOSIS — N2581 Secondary hyperparathyroidism of renal origin: Secondary | ICD-10-CM | POA: Diagnosis not present

## 2019-11-11 DIAGNOSIS — E785 Hyperlipidemia, unspecified: Secondary | ICD-10-CM | POA: Insufficient documentation

## 2019-11-11 DIAGNOSIS — N529 Male erectile dysfunction, unspecified: Secondary | ICD-10-CM | POA: Insufficient documentation

## 2019-11-11 DIAGNOSIS — I10 Essential (primary) hypertension: Secondary | ICD-10-CM | POA: Insufficient documentation

## 2019-11-11 DIAGNOSIS — Z23 Encounter for immunization: Secondary | ICD-10-CM | POA: Diagnosis not present

## 2019-11-11 DIAGNOSIS — Z992 Dependence on renal dialysis: Secondary | ICD-10-CM | POA: Diagnosis not present

## 2019-11-11 DIAGNOSIS — I4891 Unspecified atrial fibrillation: Secondary | ICD-10-CM | POA: Insufficient documentation

## 2019-11-11 DIAGNOSIS — I482 Chronic atrial fibrillation, unspecified: Secondary | ICD-10-CM | POA: Insufficient documentation

## 2019-11-11 DIAGNOSIS — E1169 Type 2 diabetes mellitus with other specified complication: Secondary | ICD-10-CM | POA: Insufficient documentation

## 2019-11-11 DIAGNOSIS — D689 Coagulation defect, unspecified: Secondary | ICD-10-CM | POA: Diagnosis not present

## 2019-11-11 DIAGNOSIS — E8770 Fluid overload, unspecified: Secondary | ICD-10-CM | POA: Diagnosis not present

## 2019-11-11 DIAGNOSIS — N186 End stage renal disease: Secondary | ICD-10-CM | POA: Diagnosis not present

## 2019-11-13 DIAGNOSIS — N2581 Secondary hyperparathyroidism of renal origin: Secondary | ICD-10-CM | POA: Diagnosis not present

## 2019-11-13 DIAGNOSIS — N186 End stage renal disease: Secondary | ICD-10-CM | POA: Diagnosis not present

## 2019-11-13 DIAGNOSIS — E8770 Fluid overload, unspecified: Secondary | ICD-10-CM | POA: Diagnosis not present

## 2019-11-13 DIAGNOSIS — Z992 Dependence on renal dialysis: Secondary | ICD-10-CM | POA: Diagnosis not present

## 2019-11-13 DIAGNOSIS — D689 Coagulation defect, unspecified: Secondary | ICD-10-CM | POA: Diagnosis not present

## 2019-11-13 DIAGNOSIS — Z23 Encounter for immunization: Secondary | ICD-10-CM | POA: Diagnosis not present

## 2019-11-13 NOTE — Telephone Encounter (Signed)
Lvm to make this apt. 

## 2019-11-14 ENCOUNTER — Ambulatory Visit (INDEPENDENT_AMBULATORY_CARE_PROVIDER_SITE_OTHER): Payer: Medicare Other | Admitting: General Practice

## 2019-11-14 ENCOUNTER — Encounter: Payer: Self-pay | Admitting: Nurse Practitioner

## 2019-11-14 ENCOUNTER — Telehealth: Payer: Medicare Other | Admitting: General Practice

## 2019-11-14 ENCOUNTER — Other Ambulatory Visit: Payer: Self-pay | Admitting: Nurse Practitioner

## 2019-11-14 DIAGNOSIS — I110 Hypertensive heart disease with heart failure: Secondary | ICD-10-CM

## 2019-11-14 DIAGNOSIS — N2581 Secondary hyperparathyroidism of renal origin: Secondary | ICD-10-CM | POA: Diagnosis not present

## 2019-11-14 DIAGNOSIS — D689 Coagulation defect, unspecified: Secondary | ICD-10-CM | POA: Diagnosis not present

## 2019-11-14 DIAGNOSIS — E8770 Fluid overload, unspecified: Secondary | ICD-10-CM | POA: Diagnosis not present

## 2019-11-14 DIAGNOSIS — E1122 Type 2 diabetes mellitus with diabetic chronic kidney disease: Secondary | ICD-10-CM

## 2019-11-14 DIAGNOSIS — Z992 Dependence on renal dialysis: Secondary | ICD-10-CM | POA: Diagnosis not present

## 2019-11-14 DIAGNOSIS — Z23 Encounter for immunization: Secondary | ICD-10-CM | POA: Diagnosis not present

## 2019-11-14 DIAGNOSIS — E785 Hyperlipidemia, unspecified: Secondary | ICD-10-CM | POA: Diagnosis not present

## 2019-11-14 DIAGNOSIS — N186 End stage renal disease: Secondary | ICD-10-CM | POA: Diagnosis not present

## 2019-11-14 DIAGNOSIS — I509 Heart failure, unspecified: Secondary | ICD-10-CM

## 2019-11-14 DIAGNOSIS — I4891 Unspecified atrial fibrillation: Secondary | ICD-10-CM

## 2019-11-14 DIAGNOSIS — I120 Hypertensive chronic kidney disease with stage 5 chronic kidney disease or end stage renal disease: Secondary | ICD-10-CM | POA: Diagnosis not present

## 2019-11-14 MED ORDER — BLOOD PRESSURE MONITOR AUTOMAT DEVI: 1 refills | Status: DC

## 2019-11-14 MED ORDER — ONETOUCH ULTRASOFT LANCETS MISC: 12 refills | Status: DC

## 2019-11-14 MED ORDER — ONETOUCH VERIO W/DEVICE KIT: PACK | 1 refills | Status: DC

## 2019-11-14 MED ORDER — ONETOUCH VERIO VI STRP: ORAL_STRIP | 12 refills | Status: DC

## 2019-11-14 NOTE — Patient Instructions (Signed)
Visit Information  Goals Addressed              This Visit's Progress   .  RNCM: Pt- "I had my blood sugar checked this am at dialysis" (pt-stated)        CARE PLAN ENTRY (see longtitudinal plan of care for additional care plan information)  Objective:  . No results found for: HGBA1C . No results found for: CREATININE . No results found for: EGFR  Current Barriers:  Marland Kitchen Knowledge Deficits related to basic Diabetes pathophysiology and self care/management . Knowledge Deficits related to medications used for management of diabetes . Does not have glucometer to monitor blood sugar- only has blood sugars checked at dialysis- says he does not have a meter . Literacy barriers . Knowledge Deficits related to self administration of insulin . Limited Social Support  Case Manager Clinical Goal(s):  Over the next 120 days, patient will demonstrate improved adherence to prescribed treatment plan for diabetes self care/management as evidenced by:  . daily monitoring and recording of CBG  . adherence to ADA/ carb modified diet . exercise 7 days/week . adherence to prescribed medication regimen  Interventions:  . Provided education to patient about basic DM disease process . Reviewed medications with patient and discussed importance of medication adherence . Discussed plans with patient for ongoing care management follow up and provided patient with direct contact information for care management team . Provided patient with written educational materials related to hypo and hyperglycemia and importance of correct treatment . Advised patient, providing education and rationale, to check cbg daily and record, calling pcp for findings outside established parameters.   . Referral made to pharmacy team for assistance with support and education on chronic conditions . Referral made to social work team for assistance with help with noncompliance and social needs . Review of patient status, including  review of consultants reports, relevant laboratory and other test results, and medications completed. . Collaboration with the pcp and pharmacist to get a glucose meter script sent to [place] so the patient can check blood sugars at home. Today at dialysis it was 116.   Patient Self Care Activities:  . UNABLE to independently manage diabetes  . Self administers oral medications as prescribed . Self administers insulin as prescribed . Checks blood sugars as prescribed and utilize hyper and hypoglycemia protocol as needed  Initial goal documentation     .  RNCM: Pt-"I get my blood pressure checked at dialysis" (pt-stated)        CARE PLAN ENTRY (see longtitudinal plan of care for additional care plan information)  Current Barriers:  . Chronic Disease Management support, education, and care coordination needs related to Atrial Fibrillation, CHF, CAD, HTN, HLD, and ESRD  Clinical Goal(s) related to Atrial Fibrillation, CHF, CAD, HTN, HLD, and ESRD:  Over the next 120 days, patient will:  . Work with the care management team to address educational, disease management, and care coordination needs  . Begin or continue self health monitoring activities as directed today Measure and record blood pressure 2/3 times per week, Measure and record weight daily, and adhere to a heart healthy/ADA diet . Call provider office for new or worsened signs and symptoms Blood pressure findings outside established parameters, Weight outside established parameters, Oxygen saturation lower than established parameter, Chest pain, Shortness of breath, and New or worsened symptom related to ESRD and other chronic conditions.  . Call care management team with questions or concerns . Verbalize basic understanding of  patient centered plan of care established today  Interventions related to Atrial Fibrillation, CHF, CAD, HTN, HLD, and ESRD:  . Evaluation of current treatment plans and patient's adherence to plan  as established by provider.  The patient states he has dialysis 4 days a week. Could not give the [facility] specific schedule. States he gets his blood pressure checked there. Says he does not have a way to check it at home. The patient denies headache or issues with following plan of care.  . Assessed patient understanding of disease states. The patient states that he understands his conditions but when asking questions about his conditions he does not know the answers to why he takes a certain medication or what readings he is having for his blood pressure at dialysis. Does live with his daughter and she helps him with medications.  . Assessed patient's education and care coordination needs.  The patient verbalized that he does not have a blood pressure cuff or a glucose meter. Will follow up with the pcp and pharmacist concerning a script for this. Likely he will have to get a blood pressure cuff through the over the counter product book.  . Provided disease specific education to patient.  Education on heart healthy/ADA diet. The patient states he does not eat salt and he watches his sugar intake. Eats baked foods.  Adam Howell with appropriate clinical care team members regarding patient needs.  The patient is currently working with the pharmacist and has an upcoming appointment with the LCSW.   Patient Self Care Activities related to Atrial Fibrillation, CHF, CAD, HTN, HLD, and ESRD:  . Patient is unable to independently self-manage chronic health conditions  Initial goal documentation        Adam Howell was given information about Chronic Care Management services today including:  1. CCM service includes personalized support from designated clinical staff supervised by his physician, including individualized plan of care and coordination with other care providers 2. 24/7 contact phone numbers for assistance for urgent and routine care needs. 3. Service will only be billed when office clinical  staff spend 20 minutes or more in a month to coordinate care. 4. Only one practitioner may furnish and bill the service in a calendar month. 5. The patient may stop CCM services at any time (effective at the end of the month) by phone call to the office staff. 6. The patient will be responsible for cost sharing (co-pay) of up to 20% of the service fee (after annual deductible is met).  Patient agreed to services and verbal consent obtained.   Patient verbalizes understanding of instructions provided today.   Telephone follow up appointment with care management team member scheduled for: 12-29-2019 at [place] am  Noreene Larsson RN, MSN, [facility] Mobile: [phone]

## 2019-11-14 NOTE — Telephone Encounter (Signed)
Lvm to make this apt. Sent letter.

## 2019-11-14 NOTE — Chronic Care Management (AMB) (Signed)
Chronic Care Management   Initial Visit Note  11/14/2019 Name: Adam Howell MRN: [id number] DOB: [birth year]  Referred by: Venita Lick, NP Reason for referral : Chronic Care Management (RNCM Initial outreach for chronic disease management and care coordination needs.)   Adam Howell is a 59 y.o. year old male who is a primary care patient of Cannady, Barbaraann Faster, NP. The CCM team was consulted for assistance with chronic disease management and care coordination needs related to Atrial Fibrillation, CHF, CAD, HTN, HLD, DMII and ESRD  Review of patient status, including review of consultants reports, relevant laboratory and other test results, and collaboration with appropriate care team members and the patient's provider was performed as part of comprehensive patient evaluation and provision of chronic care management services.    SDOH (Social Determinants of Health) assessments performed: Yes See Care Plan activities for detailed interventions related to SDOH     Medications: Outpatient Encounter Medications as of 11/14/2019  Medication Sig   amiodarone (PACERONE) 200 MG tablet Take 200 mg by mouth 2 (two) times daily.   bumetanide (BUMEX) 1 MG tablet Take 1 mg by mouth 2 (two) times daily.   carvedilol (COREG) 3.125 MG tablet Take 3.125 mg by mouth 2 (two) times daily.   ELIQUIS 2.5 MG TABS tablet Take 2.5 mg by mouth 2 (two) times daily. Sig: Daily as presciribed.   furosemide (LASIX) 80 MG tablet Take 80 mg by mouth 2 (two) times daily.   gabapentin (NEURONTIN) 300 MG capsule Take 300 mg by mouth 2 (two) times daily.    HYDROcodone-acetaminophen (NORCO) 10-325 MG tablet Take 1 tablet by mouth 2 (two) times daily as needed. Only takes after Dialysis   isosorbide mononitrate (IMDUR) 30 MG 24 hr tablet Take 30 mg by mouth daily.   LEVEMIR FLEXTOUCH 100 UNIT/ML FlexPen Inject 20 Units into the skin daily. Patient is prescribed 20 units daily. Does not take with  regularity per Daughter   losartan (COZAAR) 100 MG tablet Take 100 mg by mouth daily.   NIFEdipine (PROCARDIA-XL/NIFEDICAL-XL) 30 MG 24 hr tablet Take 30 mg by mouth daily.   rosuvastatin (CRESTOR) 40 MG tablet Take 40 mg by mouth daily.   sevelamer carbonate (RENVELA) 800 MG tablet Take 1,600 mg by mouth 3 (three) times daily.   zolpidem (AMBIEN) 10 MG tablet Take 10 mg by mouth at bedtime as needed.    No facility-administered encounter medications on file as of 11/14/2019.     Objective:  BP Readings from Last 3 Encounters:  11/13/19 (!) 154/90  11/10/19 120/72  10/04/19 112/76    Goals Addressed              This Visit's Progress     RNCM: Pt- "I had my blood sugar checked this am at dialysis" (pt-stated)        CARE PLAN ENTRY (see longtitudinal plan of care for additional care plan information)  Objective:   No results found for: HGBA1C  No results found for: CREATININE  No results found for: EGFR  Current Barriers:   Knowledge Deficits related to basic Diabetes pathophysiology and self care/management  Knowledge Deficits related to medications used for management of diabetes  Does not have glucometer to monitor blood sugar- only has blood sugars checked at dialysis- says he does not have a meter  Literacy barriers  Knowledge Deficits related to self administration of insulin  Limited Social Support  Case Manager Clinical Goal(s):  Over the next 120 days, patient  will demonstrate improved adherence to prescribed treatment plan for diabetes self care/management as evidenced by:   daily monitoring and recording of CBG   adherence to ADA/ carb modified diet  exercise 7 days/week  adherence to prescribed medication regimen  Interventions:   Provided education to patient about basic DM disease process  Reviewed medications with patient and discussed importance of medication adherence  Discussed plans with patient for ongoing care management  follow up and provided patient with direct contact information for care management team  Provided patient with written educational materials related to hypo and hyperglycemia and importance of correct treatment  Advised patient, providing education and rationale, to check cbg daily and record, calling pcp for findings outside established parameters.    Referral made to pharmacy team for assistance with support and education on chronic conditions  Referral made to social work team for assistance with help with noncompliance and social needs  Review of patient status, including review of consultants reports, relevant laboratory and other test results, and medications completed.  Collaboration with the pcp and pharmacist to get a glucose meter script sent to [facility] so the patient can check blood sugars at home. Today at dialysis it was 116.   Patient Self Care Activities:   UNABLE to independently manage diabetes   Self administers oral medications as prescribed  Self administers insulin as prescribed  Checks blood sugars as prescribed and utilize hyper and hypoglycemia protocol as needed  Initial goal documentation       RNCM: Pt-"I get my blood pressure checked at dialysis" (pt-stated)        CARE PLAN ENTRY (see longtitudinal plan of care for additional care plan information)  Current Barriers:   Chronic Disease Management support, education, and care coordination needs related to Atrial Fibrillation, CHF, CAD, HTN, HLD, and ESRD  Clinical Goal(s) related to Atrial Fibrillation, CHF, CAD, HTN, HLD, and ESRD:  Over the next 120 days, patient will:   Work with the care management team to address educational, disease management, and care coordination needs   Begin or continue self health monitoring activities as directed today Measure and record blood pressure 2/3 times per week, Measure and record weight daily, and adhere to a heart healthy/ADA diet  Call provider  office for new or worsened signs and symptoms Blood pressure findings outside established parameters, Weight outside established parameters, Oxygen saturation lower than established parameter, Chest pain, Shortness of breath, and New or worsened symptom related to ESRD and other chronic conditions.   Call care management team with questions or concerns  Verbalize basic understanding of patient centered plan of care established today  Interventions related to Atrial Fibrillation, CHF, CAD, HTN, HLD, and ESRD:   Evaluation of current treatment plans and patient's adherence to plan as established by provider.  The patient states he has dialysis 4 days a week. Could not give the [facility] specific schedule. States he gets his blood pressure checked there. Says he does not have a way to check it at home. The patient denies headache or issues with following plan of care.   Assessed patient understanding of disease states. The patient states that he understands his conditions but when asking questions about his conditions he does not know the answers to why he takes a certain medication or what readings he is having for his blood pressure at dialysis. Does live with his daughter and she helps him with medications.   Assessed patient's education and care coordination needs.  The patient  verbalized that he does not have a blood pressure cuff or a glucose meter. Will follow up with the pcp and pharmacist concerning a script for this. Likely he will have to get a blood pressure cuff through the over the counter product book.   Provided disease specific education to patient.  Education on heart healthy/ADA diet. The patient states he does not eat salt and he watches his sugar intake. Eats baked foods.   Collaborated with appropriate clinical care team members regarding patient needs.  The patient is currently working with the pharmacist and has an upcoming appointment with the LCSW.   Patient Self Care Activities  related to Atrial Fibrillation, CHF, CAD, HTN, HLD, and ESRD:   Patient is unable to independently self-manage chronic health conditions  Initial goal documentation         Adam Howell was given information about Chronic Care Management services today including:  1. CCM service includes personalized support from designated clinical staff supervised by his physician, including individualized plan of care and coordination with other care providers 2. 24/7 contact phone numbers for assistance for urgent and routine care needs. 3. Service will only be billed when office clinical staff spend 20 minutes or more in a month to coordinate care. 4. Only one practitioner may furnish and bill the service in a calendar month. 5. The patient may stop CCM services at any time (effective at the end of the month) by phone call to the office staff. 6. The patient will be responsible for cost sharing (co-pay) of up to 20% of the service fee (after annual deductible is met).  Patient agreed to services and verbal consent obtained.   Plan:   Telephone follow up appointment with care management team member scheduled for: 12-29-2019 at [place] am  Noreene Larsson RN, MSN, [facility] Mobile: [phone]

## 2019-11-16 DIAGNOSIS — N186 End stage renal disease: Secondary | ICD-10-CM | POA: Diagnosis not present

## 2019-11-16 DIAGNOSIS — N2581 Secondary hyperparathyroidism of renal origin: Secondary | ICD-10-CM | POA: Diagnosis not present

## 2019-11-16 DIAGNOSIS — Z992 Dependence on renal dialysis: Secondary | ICD-10-CM | POA: Diagnosis not present

## 2019-11-16 DIAGNOSIS — D689 Coagulation defect, unspecified: Secondary | ICD-10-CM | POA: Diagnosis not present

## 2019-11-16 DIAGNOSIS — E8770 Fluid overload, unspecified: Secondary | ICD-10-CM | POA: Diagnosis not present

## 2019-11-16 DIAGNOSIS — Z23 Encounter for immunization: Secondary | ICD-10-CM | POA: Diagnosis not present

## 2019-11-18 DIAGNOSIS — D689 Coagulation defect, unspecified: Secondary | ICD-10-CM | POA: Diagnosis not present

## 2019-11-18 DIAGNOSIS — E8770 Fluid overload, unspecified: Secondary | ICD-10-CM | POA: Diagnosis not present

## 2019-11-18 DIAGNOSIS — N186 End stage renal disease: Secondary | ICD-10-CM | POA: Diagnosis not present

## 2019-11-18 DIAGNOSIS — N2581 Secondary hyperparathyroidism of renal origin: Secondary | ICD-10-CM | POA: Diagnosis not present

## 2019-11-18 DIAGNOSIS — Z992 Dependence on renal dialysis: Secondary | ICD-10-CM | POA: Diagnosis not present

## 2019-11-18 DIAGNOSIS — Z23 Encounter for immunization: Secondary | ICD-10-CM | POA: Diagnosis not present

## 2019-11-20 DIAGNOSIS — Z23 Encounter for immunization: Secondary | ICD-10-CM | POA: Diagnosis not present

## 2019-11-20 DIAGNOSIS — E8770 Fluid overload, unspecified: Secondary | ICD-10-CM | POA: Diagnosis not present

## 2019-11-20 DIAGNOSIS — N2581 Secondary hyperparathyroidism of renal origin: Secondary | ICD-10-CM | POA: Diagnosis not present

## 2019-11-20 DIAGNOSIS — D689 Coagulation defect, unspecified: Secondary | ICD-10-CM | POA: Diagnosis not present

## 2019-11-20 DIAGNOSIS — Z992 Dependence on renal dialysis: Secondary | ICD-10-CM | POA: Diagnosis not present

## 2019-11-20 DIAGNOSIS — N186 End stage renal disease: Secondary | ICD-10-CM | POA: Diagnosis not present

## 2019-11-21 DIAGNOSIS — N186 End stage renal disease: Secondary | ICD-10-CM | POA: Diagnosis not present

## 2019-11-21 DIAGNOSIS — E8770 Fluid overload, unspecified: Secondary | ICD-10-CM | POA: Diagnosis not present

## 2019-11-21 DIAGNOSIS — Z23 Encounter for immunization: Secondary | ICD-10-CM | POA: Diagnosis not present

## 2019-11-21 DIAGNOSIS — D689 Coagulation defect, unspecified: Secondary | ICD-10-CM | POA: Diagnosis not present

## 2019-11-21 DIAGNOSIS — N2581 Secondary hyperparathyroidism of renal origin: Secondary | ICD-10-CM | POA: Diagnosis not present

## 2019-11-21 DIAGNOSIS — Z992 Dependence on renal dialysis: Secondary | ICD-10-CM | POA: Diagnosis not present

## 2019-11-23 DIAGNOSIS — Z992 Dependence on renal dialysis: Secondary | ICD-10-CM | POA: Diagnosis not present

## 2019-11-23 DIAGNOSIS — D689 Coagulation defect, unspecified: Secondary | ICD-10-CM | POA: Diagnosis not present

## 2019-11-23 DIAGNOSIS — N2581 Secondary hyperparathyroidism of renal origin: Secondary | ICD-10-CM | POA: Diagnosis not present

## 2019-11-23 DIAGNOSIS — N186 End stage renal disease: Secondary | ICD-10-CM | POA: Diagnosis not present

## 2019-11-23 DIAGNOSIS — Z23 Encounter for immunization: Secondary | ICD-10-CM | POA: Diagnosis not present

## 2019-11-23 DIAGNOSIS — E8770 Fluid overload, unspecified: Secondary | ICD-10-CM | POA: Diagnosis not present

## 2019-11-25 DIAGNOSIS — N186 End stage renal disease: Secondary | ICD-10-CM | POA: Diagnosis not present

## 2019-11-25 DIAGNOSIS — N2581 Secondary hyperparathyroidism of renal origin: Secondary | ICD-10-CM | POA: Diagnosis not present

## 2019-11-25 DIAGNOSIS — Z992 Dependence on renal dialysis: Secondary | ICD-10-CM | POA: Diagnosis not present

## 2019-11-25 DIAGNOSIS — D689 Coagulation defect, unspecified: Secondary | ICD-10-CM | POA: Diagnosis not present

## 2019-11-25 DIAGNOSIS — E8770 Fluid overload, unspecified: Secondary | ICD-10-CM | POA: Diagnosis not present

## 2019-11-25 DIAGNOSIS — Z23 Encounter for immunization: Secondary | ICD-10-CM | POA: Diagnosis not present

## 2019-11-27 DIAGNOSIS — E8770 Fluid overload, unspecified: Secondary | ICD-10-CM | POA: Diagnosis not present

## 2019-11-27 DIAGNOSIS — N2581 Secondary hyperparathyroidism of renal origin: Secondary | ICD-10-CM | POA: Diagnosis not present

## 2019-11-27 DIAGNOSIS — Z992 Dependence on renal dialysis: Secondary | ICD-10-CM | POA: Diagnosis not present

## 2019-11-27 DIAGNOSIS — D689 Coagulation defect, unspecified: Secondary | ICD-10-CM | POA: Diagnosis not present

## 2019-11-27 DIAGNOSIS — N186 End stage renal disease: Secondary | ICD-10-CM | POA: Diagnosis not present

## 2019-11-27 DIAGNOSIS — Z23 Encounter for immunization: Secondary | ICD-10-CM | POA: Diagnosis not present

## 2019-11-28 ENCOUNTER — Telehealth: Payer: Self-pay | Admitting: *Deleted

## 2019-11-28 DIAGNOSIS — N2581 Secondary hyperparathyroidism of renal origin: Secondary | ICD-10-CM | POA: Diagnosis not present

## 2019-11-28 DIAGNOSIS — I509 Heart failure, unspecified: Secondary | ICD-10-CM

## 2019-11-28 DIAGNOSIS — Z992 Dependence on renal dialysis: Secondary | ICD-10-CM | POA: Diagnosis not present

## 2019-11-28 DIAGNOSIS — E8770 Fluid overload, unspecified: Secondary | ICD-10-CM | POA: Diagnosis not present

## 2019-11-28 DIAGNOSIS — D689 Coagulation defect, unspecified: Secondary | ICD-10-CM | POA: Diagnosis not present

## 2019-11-28 DIAGNOSIS — N186 End stage renal disease: Secondary | ICD-10-CM | POA: Diagnosis not present

## 2019-11-28 DIAGNOSIS — I4891 Unspecified atrial fibrillation: Secondary | ICD-10-CM

## 2019-11-28 DIAGNOSIS — Z23 Encounter for immunization: Secondary | ICD-10-CM | POA: Diagnosis not present

## 2019-11-28 NOTE — Telephone Encounter (Signed)
Patient notified and agreeable to plan of care. He is already scheduled for an echo in the days prior to follow up appointment.

## 2019-11-28 NOTE — Telephone Encounter (Signed)
-----   Message from Nelva Bush, MD sent at 11/28/2019  6:43 AM EDT ----- Regarding: Outside records and testing Hi Anderson Malta,  Could you let Mr. Rue know that I have reviewed his records from his PCP in [place]?  Unfortunately, details regarding his cardiac history remain somewhat unclear.  If possible, it would be helpful for Mr. Conlee to have an echo done before he is seen for follow-up later this month.  I would greatly appreciate it, if you could help arrange for this.  Let me know if any questions or concerns come up.  Thanks.  Gerald Stabs

## 2019-11-30 DIAGNOSIS — Z23 Encounter for immunization: Secondary | ICD-10-CM | POA: Diagnosis not present

## 2019-11-30 DIAGNOSIS — N2581 Secondary hyperparathyroidism of renal origin: Secondary | ICD-10-CM | POA: Diagnosis not present

## 2019-11-30 DIAGNOSIS — Z992 Dependence on renal dialysis: Secondary | ICD-10-CM | POA: Diagnosis not present

## 2019-11-30 DIAGNOSIS — D689 Coagulation defect, unspecified: Secondary | ICD-10-CM | POA: Diagnosis not present

## 2019-11-30 DIAGNOSIS — N186 End stage renal disease: Secondary | ICD-10-CM | POA: Diagnosis not present

## 2019-11-30 DIAGNOSIS — E8770 Fluid overload, unspecified: Secondary | ICD-10-CM | POA: Diagnosis not present

## 2019-12-01 ENCOUNTER — Ambulatory Visit: Payer: Medicare Other | Admitting: Licensed Clinical Social Worker

## 2019-12-01 NOTE — Chronic Care Management (AMB) (Signed)
  Care Management   Follow Up Note   12/01/2019 Name: Adam Howell MRN: [id number] DOB: 06/28/1960  Referred by: Venita Lick, NP Reason for referral : [name] is a 59 y.o. year old male who is a primary care patient of Cannady, Barbaraann Faster, NP. The care management team was consulted for assistance with care management and care coordination needs.    Review of patient status, including review of consultants reports, relevant laboratory and other test results, and collaboration with appropriate care team members and the patient's provider was performed as part of comprehensive patient evaluation and provision of chronic care management services.    LCSW completed initial CCM Social Work on 12/01/19 and spoke to patient successfully. Patient answered and reports that he is not currently interesting in receiving my services at this time as he feels he is managing his mental and physical health care appropriately. He denied needing community resource education at this time. He denied needing stress management education as well. He is agreeable to contact CCM LCSW in the case that he changes his mind. LCSW will update CCM team and PCP.    SDOH (Social Determinants of Health) assessments performed: Yes See Care Plan activities for detailed interventions related to [facility])    The patient will call CCM LCSW as advised if he wishes to gain [place].   Eula Fried, BSW, MSW, [facility] Care Management [email or web address] Phone: [phone]

## 2019-12-04 DIAGNOSIS — E8770 Fluid overload, unspecified: Secondary | ICD-10-CM | POA: Diagnosis not present

## 2019-12-04 DIAGNOSIS — N186 End stage renal disease: Secondary | ICD-10-CM | POA: Diagnosis not present

## 2019-12-04 DIAGNOSIS — Z992 Dependence on renal dialysis: Secondary | ICD-10-CM | POA: Diagnosis not present

## 2019-12-04 DIAGNOSIS — N2581 Secondary hyperparathyroidism of renal origin: Secondary | ICD-10-CM | POA: Diagnosis not present

## 2019-12-04 NOTE — Progress Notes (Deleted)
Cardiology Office Note    Date:  12/04/2019   ID:  Adam Howell, DOB April 21, 1960, MRN [id number]  PCP:  Adam Lick, NP  Cardiologist:  Adam Bush, MD  Electrophysiologist:  None   Chief Complaint: Follow up  History of Present Illness:   Adam Howell is a 59 y.o. male with history of ***  He recently moved from [place] to [place] earlier in 2021. He was seen as a new patient by Dr. Saunders Howell on 11/10/2019 for evaluation of hypertensive heart disease and ED. At his initial visit with cardiology, ***  Records from his PCP in [place] were sent, though did not provide specifics regarding his cardiac history. In this setting, he underwent an echo on 12/06/2019 that showed ***.   ***   Labs independently reviewed: ***  Past Medical History:  Diagnosis Date  . Chronic kidney disease   . Congestive heart failure ([place])   . Diabetes mellitus without complication ([place])   . Hyperlipidemia   . Hypertension     No past surgical history on file.  Current Medications: No outpatient medications have been marked as taking for the 12/11/19 encounter (Appointment) with Adam Mu, PA-C.    Allergies:   Tramadol   Social History   Socioeconomic History  . Marital status: Single    Spouse name: Not on file  . Number of children: Not on file  . Years of education: Not on file  . Highest education level: Not on file  Occupational History  . Not on file  Tobacco Use  . Smoking status: Never Smoker  . Smokeless tobacco: Never Used  Vaping Use  . Vaping Use: Never used  Substance and Sexual Activity  . Alcohol use: Not Currently  . Drug use: Never  . Sexual activity: Not Currently  Other Topics Concern  . Not on file  Social History Narrative  . Not on file   Social Determinants of Health   Financial Resource Strain: Low Risk   . Difficulty of Paying Living Expenses: Not hard at all  Food Insecurity: No Food Insecurity  . Worried About Sales executive in the Last Year: Never true  . Ran Out of Food in the Last Year: Never true  Transportation Needs: No Transportation Needs  . Lack of Transportation (Medical): No  . Lack of Transportation (Non-Medical): No  Physical Activity: Sufficiently Active  . Days of Exercise per Week: 7 days  . Minutes of Exercise per Session: 30 min  Stress: No Stress Concern Present  . Feeling of Stress : Not at all  Social Connections: Unknown  . Frequency of Communication with Friends and Family: More than three times a week  . Frequency of Social Gatherings with Friends and Family: More than three times a week  . Attends Religious Services: Never  . Active Member of Clubs or Organizations: No  . Attends Archivist Meetings: Never  . Marital Status: Patient refused     Family History:  The patient's family history includes Cirrhosis in his father; Heart failure in his mother; Hypertension in his daughter.  ROS:   ROS   EKGs/Labs/Other Studies Reviewed:    Studies reviewed were summarized above. The additional studies were reviewed today:  2D echo 12/06/2019: ***  EKG:  EKG is ordered today.  The EKG ordered today demonstrates ***  Recent Labs: No results found for requested labs within last 8760 hours.  Recent Lipid Panel No results found for:  CHOL, TRIG, HDL, CHOLHDL, VLDL, LDLCALC, LDLDIRECT  PHYSICAL EXAM:    VS:  There were no vitals taken for this visit.  BMI: There is no height or weight on file to calculate BMI.  Physical Exam  Wt Readings from Last 3 Encounters:  11/13/19 279 lb 5.2 oz (126.7 kg)  11/10/19 286 lb (129.7 kg)  10/04/19 281 lb 9.6 oz (127.7 kg)     ASSESSMENT & PLAN:   1. ***  Disposition: F/u with Dr. Saunders Howell or an APP in ***.   Medication Adjustments/Labs and Tests Ordered: Current medicines are reviewed at length with the patient today.  Concerns regarding medicines are outlined above. Medication changes, Labs and Tests ordered today are  summarized above and listed in the Patient Instructions accessible in Encounters.   Signed, Adam Faith, PA-C 12/04/2019 2:38 PM     [address] [place], [place] [address] [phone]

## 2019-12-05 DIAGNOSIS — E8770 Fluid overload, unspecified: Secondary | ICD-10-CM | POA: Diagnosis not present

## 2019-12-05 DIAGNOSIS — N2581 Secondary hyperparathyroidism of renal origin: Secondary | ICD-10-CM | POA: Diagnosis not present

## 2019-12-05 DIAGNOSIS — D689 Coagulation defect, unspecified: Secondary | ICD-10-CM | POA: Diagnosis not present

## 2019-12-05 DIAGNOSIS — Z23 Encounter for immunization: Secondary | ICD-10-CM | POA: Diagnosis not present

## 2019-12-05 DIAGNOSIS — N186 End stage renal disease: Secondary | ICD-10-CM | POA: Diagnosis not present

## 2019-12-05 DIAGNOSIS — Z992 Dependence on renal dialysis: Secondary | ICD-10-CM | POA: Diagnosis not present

## 2019-12-06 ENCOUNTER — Other Ambulatory Visit: Payer: Medicare Other

## 2019-12-07 DIAGNOSIS — Z23 Encounter for immunization: Secondary | ICD-10-CM | POA: Diagnosis not present

## 2019-12-07 DIAGNOSIS — N186 End stage renal disease: Secondary | ICD-10-CM | POA: Diagnosis not present

## 2019-12-07 DIAGNOSIS — Z992 Dependence on renal dialysis: Secondary | ICD-10-CM | POA: Diagnosis not present

## 2019-12-07 DIAGNOSIS — N2581 Secondary hyperparathyroidism of renal origin: Secondary | ICD-10-CM | POA: Diagnosis not present

## 2019-12-07 DIAGNOSIS — E8770 Fluid overload, unspecified: Secondary | ICD-10-CM | POA: Diagnosis not present

## 2019-12-07 DIAGNOSIS — D689 Coagulation defect, unspecified: Secondary | ICD-10-CM | POA: Diagnosis not present

## 2019-12-08 DIAGNOSIS — I871 Compression of vein: Secondary | ICD-10-CM | POA: Diagnosis not present

## 2019-12-09 DIAGNOSIS — D689 Coagulation defect, unspecified: Secondary | ICD-10-CM | POA: Diagnosis not present

## 2019-12-09 DIAGNOSIS — E8770 Fluid overload, unspecified: Secondary | ICD-10-CM | POA: Diagnosis not present

## 2019-12-09 DIAGNOSIS — N2581 Secondary hyperparathyroidism of renal origin: Secondary | ICD-10-CM | POA: Diagnosis not present

## 2019-12-09 DIAGNOSIS — Z23 Encounter for immunization: Secondary | ICD-10-CM | POA: Diagnosis not present

## 2019-12-09 DIAGNOSIS — N186 End stage renal disease: Secondary | ICD-10-CM | POA: Diagnosis not present

## 2019-12-09 DIAGNOSIS — Z992 Dependence on renal dialysis: Secondary | ICD-10-CM | POA: Diagnosis not present

## 2019-12-11 ENCOUNTER — Ambulatory Visit: Payer: Medicare Other | Admitting: Physician Assistant

## 2019-12-12 DIAGNOSIS — Z23 Encounter for immunization: Secondary | ICD-10-CM | POA: Diagnosis not present

## 2019-12-12 DIAGNOSIS — Z992 Dependence on renal dialysis: Secondary | ICD-10-CM | POA: Diagnosis not present

## 2019-12-12 DIAGNOSIS — N186 End stage renal disease: Secondary | ICD-10-CM | POA: Diagnosis not present

## 2019-12-12 DIAGNOSIS — E8770 Fluid overload, unspecified: Secondary | ICD-10-CM | POA: Diagnosis not present

## 2019-12-12 DIAGNOSIS — N2581 Secondary hyperparathyroidism of renal origin: Secondary | ICD-10-CM | POA: Diagnosis not present

## 2019-12-12 DIAGNOSIS — D689 Coagulation defect, unspecified: Secondary | ICD-10-CM | POA: Diagnosis not present

## 2019-12-13 DIAGNOSIS — E8770 Fluid overload, unspecified: Secondary | ICD-10-CM | POA: Diagnosis not present

## 2019-12-13 DIAGNOSIS — Z992 Dependence on renal dialysis: Secondary | ICD-10-CM | POA: Diagnosis not present

## 2019-12-13 DIAGNOSIS — Z23 Encounter for immunization: Secondary | ICD-10-CM | POA: Diagnosis not present

## 2019-12-13 DIAGNOSIS — N2581 Secondary hyperparathyroidism of renal origin: Secondary | ICD-10-CM | POA: Diagnosis not present

## 2019-12-13 DIAGNOSIS — D689 Coagulation defect, unspecified: Secondary | ICD-10-CM | POA: Diagnosis not present

## 2019-12-13 DIAGNOSIS — N186 End stage renal disease: Secondary | ICD-10-CM | POA: Diagnosis not present

## 2019-12-14 DIAGNOSIS — N186 End stage renal disease: Secondary | ICD-10-CM | POA: Diagnosis not present

## 2019-12-14 DIAGNOSIS — I120 Hypertensive chronic kidney disease with stage 5 chronic kidney disease or end stage renal disease: Secondary | ICD-10-CM | POA: Diagnosis not present

## 2019-12-14 DIAGNOSIS — Z992 Dependence on renal dialysis: Secondary | ICD-10-CM | POA: Diagnosis not present

## 2019-12-14 DIAGNOSIS — Z23 Encounter for immunization: Secondary | ICD-10-CM | POA: Diagnosis not present

## 2019-12-14 DIAGNOSIS — D689 Coagulation defect, unspecified: Secondary | ICD-10-CM | POA: Diagnosis not present

## 2019-12-14 DIAGNOSIS — N2581 Secondary hyperparathyroidism of renal origin: Secondary | ICD-10-CM | POA: Diagnosis not present

## 2019-12-14 DIAGNOSIS — E8770 Fluid overload, unspecified: Secondary | ICD-10-CM | POA: Diagnosis not present

## 2019-12-18 ENCOUNTER — Ambulatory Visit: Payer: Medicare Other

## 2019-12-18 ENCOUNTER — Telehealth: Payer: Self-pay | Admitting: Pharmacist

## 2019-12-18 NOTE — Progress Notes (Signed)
  Chronic Care Management   Outreach Note  12/18/2019 Name: Adam Howell MRN: [id number] DOB: 1961-02-09  Referred by: Venita Lick, NP Reason for referral : No chief complaint on file.   An unsuccessful telephone outreach was attempted today. The patient was referred to the pharmacist for assistance with care management and care coordination.  Left HIPAA compliant message for him to return my call at his convenience.  Will ask careguide to reschedule for continued medication management concerns.    Junita Push. Kenton Kingfisher PharmD, [facility] [phone]

## 2019-12-23 NOTE — Progress Notes (Signed)
BP (!) 153/84 (BP Location: Right Arm, Howell Size: Large)   Pulse 98   Temp 98.5 F (36.9 C) (Oral)   Wt 296 lb 3.2 oz (134.4 kg)   SpO2 100%   BMI 37.02 kg/m    Subjective:    [name], male    DOB: 30-Jan-1961, 59 y.o.   MRN: [id number]  HPI: Adam Howell is a 60 y.o. male presenting for leg pain.  Chief Complaint  Patient presents with  . Leg Pain    L calf pain, states the pain has been there for a while, just gradually getting worse. Currently rating pain at a 10. No known injuries per patient.  . Insomnia    pt states he would like to start back on Ambien, states he was on it in the past but has not had it since living in [place] Duration: months Pain: yes Severity: 10/10  Quality:  Sharp, shooting Location:  lower legs Bilateral:  Yes at times; only in 1 leg at a time Onset: gradual Frequency: every day Time of  day:   at random Sudden unintentional leg jerking:   no Paresthesias:   yes Decreased sensation:  no Weakness:   no Insomnia:   yes Fatigue:   no Alleviating factors:  Aggravating factors: Status: worse Treatments attempted: ibuprofen, aspirin, Tylenol  INSOMNIA Duration: months Satisfied with sleep quality: no Difficulty falling asleep: yes Difficulty staying asleep: yes Waking a few hours after sleep onset: yes Early morning awakenings: yes Daytime hypersomnolence: no Wakes feeling refreshed: yes Good sleep hygiene: yes Apnea: no Snoring: no Depressed/anxious mood: no Recent stress: no Restless legs/nocturnal leg cramps: yes Chronic pain/arthritis: yes History of sleep study: yes; reports he does have sleep apnea Treatments attempted:  Nyquil, melatonin, Ambien     Allergies  Allergen Reactions  . Tramadol Rash   Outpatient Encounter Medications as of 12/25/2019  Medication Sig  . amiodarone (PACERONE) 200 MG tablet Take 200 mg by mouth 2 (two) times daily.  . Blood Glucose Monitoring Suppl  (ONETOUCH VERIO) w/Device KIT Use to check blood sugar 4 times a day.  . Blood Pressure Monitoring (BLOOD PRESSURE MONITOR AUTOMAT) DEVI Use to check blood pressure twice daily.  . bumetanide (BUMEX) 1 MG tablet Take 1 mg by mouth 2 (two) times daily.  . carvedilol (COREG) 3.125 MG tablet Take 3.125 mg by mouth 2 (two) times daily.  Marland Kitchen ELIQUIS 2.5 MG TABS tablet Take 2.5 mg by mouth 2 (two) times daily. Sig: Daily as presciribed.  . furosemide (LASIX) 80 MG tablet Take 80 mg by mouth 2 (two) times daily.  Marland Kitchen gabapentin (NEURONTIN) 300 MG capsule Take 300 mg by mouth 2 (two) times daily.   Marland Kitchen glucose blood (ONETOUCH VERIO) test strip Use to check blood sugar 4 times a day.  . isosorbide mononitrate (IMDUR) 30 MG 24 hr tablet Take 30 mg by mouth daily.  . Lancets (ONETOUCH ULTRASOFT) lancets Use to check blood sugar 4 times a day.  Marland Kitchen LEVEMIR FLEXTOUCH 100 UNIT/ML FlexPen Inject 20 Units into the skin daily. Patient is prescribed 20 units daily. Does not take with regularity per Daughter  . losartan (COZAAR) 100 MG tablet Take 100 mg by mouth daily.  Marland Kitchen NIFEdipine (PROCARDIA-XL/NIFEDICAL-XL) 30 MG 24 hr tablet Take 30 mg by mouth daily.  . rosuvastatin (CRESTOR) 40 MG tablet Take 40 mg by mouth daily.  . sevelamer carbonate (RENVELA) 800 MG tablet Take 1,600 mg by  mouth 3 (three) times daily.  . diclofenac Sodium (VOLTAREN) 1 % GEL Apply 2 g topically 4 (four) times daily as needed (leg pain).  Marland Kitchen HYDROcodone-acetaminophen (NORCO) 10-325 MG tablet Take 1 tablet by mouth 2 (two) times daily as needed. Only takes after Dialysis (Patient not taking: Reported on 12/25/2019)  . traZODone (DESYREL) 50 MG tablet Take 1 tablet (50 mg total) by mouth at bedtime as needed for sleep.  Marland Kitchen zolpidem (AMBIEN) 10 MG tablet Take 10 mg by mouth at bedtime as needed.  (Patient not taking: Reported on 12/25/2019)   No facility-administered encounter medications on file as of 12/25/2019.   Patient Active Problem List    Diagnosis Date Noted  . Pain in both lower extremities 12/25/2019  . Atrial fibrillation ([place]) 11/11/2019  . Essential hypertension 11/11/2019  . Hyperlipidemia 11/11/2019  . Erectile dysfunction 11/11/2019  . Cough 11/07/2019  . Heart failure ([place]) 10/26/2019  . Type 2 diabetes mellitus with ESRD (end-stage renal disease) ([place]) 10/04/2019  . Chronic pain syndrome 10/04/2019  . Insomnia 10/04/2019  . Atherosclerotic heart disease of native coronary artery without angina pectoris 09/01/2019  . Hypertensive heart disease with heart failure ([place]) 09/01/2019  . Morbid obesity ([place]) 09/01/2019  . Anemia in chronic kidney disease 07/25/2019  . End stage renal disease ([place]) 07/25/2019  . Hypertensive kidney disease with ESRD on dialysis ([place]) 07/25/2019  . Secondary hyperparathyroidism of renal origin ([place]) 07/25/2019   Past Medical History:  Diagnosis Date  . Chronic kidney disease   . Congestive heart failure ([place])   . Diabetes mellitus without complication ([place])   . Hyperlipidemia   . Hypertension    Relevant past medical, surgical, family and social history reviewed and updated as indicated. Interim medical history since our last visit reviewed.  Review of Systems  Constitutional: Negative.  Negative for activity change, appetite change, chills, fatigue and fever.  HENT: Negative.   Respiratory: Negative for cough, chest tightness, shortness of breath and wheezing.   Cardiovascular: Negative.  Negative for chest pain, palpitations and leg swelling.  Gastrointestinal: Negative.   Musculoskeletal: Positive for myalgias. Negative for arthralgias, back pain, gait problem, joint swelling and neck stiffness.  Skin: Negative.  Negative for color change, rash and wound.  Neurological: Negative.  Negative for dizziness, light-headedness and headaches.  Psychiatric/Behavioral: Positive for sleep disturbance. Negative for behavioral problems and decreased concentration. The patient is not  nervous/anxious and is not hyperactive.    Per HPI unless specifically indicated above     Objective:    BP (!) 153/84 (BP Location: Right Arm, [place] Size: Large)   Pulse 98   Temp 98.5 F (36.9 C) (Oral)   Wt 296 lb 3.2 oz (134.4 kg)   SpO2 100%   BMI 37.02 kg/m   Wt Readings from Last 3 Encounters:  12/25/19 296 lb 3.2 oz (134.4 kg)  11/13/19 279 lb 5.2 oz (126.7 kg)  11/10/19 286 lb (129.7 kg)    Physical Exam Vitals and nursing note reviewed.  Constitutional:      Appearance: Normal appearance. He is obese.  HENT:     Head: Normocephalic and atraumatic.     Right Ear: External ear normal.     Left Ear: External ear normal.  Eyes:     General: No scleral icterus.    Extraocular Movements: Extraocular movements intact.  Cardiovascular:     Rate and Rhythm: Normal rate and regular rhythm.     Heart sounds: Normal heart sounds. No murmur heard.  Pulmonary:     Effort: Pulmonary effort is normal. No respiratory distress.     Breath sounds: Normal breath sounds. No wheezing or rhonchi.  Musculoskeletal:        General: Tenderness (posterior upper foot/achilles area bilaterally) present. Normal range of motion.     Right lower leg: No edema.     Left lower leg: No edema.     Comments: arteriovenous fistula to left arm +bruit /+thrill  Skin:    General: Skin is warm and dry.     Capillary Refill: Capillary refill takes less than 2 seconds.     Coloration: Skin is not jaundiced or pale.  Neurological:     General: No focal deficit present.     Mental Status: [name] and oriented to person, place, and time.     Motor: No weakness.     Gait: Gait normal.  Psychiatric:        Mood and Affect: Mood normal.        Behavior: Behavior normal.        Thought Content: Thought content normal.        Judgment: Judgment normal.    No results found for this or any previous visit.    Assessment & Plan:   Problem List Items Addressed This Visit      Endocrine   Type  2 diabetes mellitus with ESRD (end-stage renal disease) (HCC)    Chronic, ongoing.  Will place referral to podiatry today for ongoing foot care.  Follow-up in 4 weeks for chronic conditions.      Relevant Orders   Ambulatory referral to Podiatry     Other   Insomnia    Chronic, ongoing.  Previously on Ambien 10 mg nightly with reportedly good response.  Has not tried alternate medications such as trazodone.  Will start trial of trazodone and follow-up in 4 weeks.  May also want to repeat sleep study as patient reports has been diagnosed with sleep apnea in the past.      Pain in both lower extremities - Primary    Acute, ongoing.  Unclear etiology.  Will obtain bilateral ultrasounds to rule out DVT.  High suspicion for intermittent claudication and/or peripheral artery disease.  If ultrasound negative, will refer to vascular.  Encouraged patient to keep legs elevated as much as possible, and to use compression socks.  Can start Voltaren gel to see if this helps with pain.      Relevant Orders   US Venous Img Lower Bilateral       Follow up plan: Return in about 4 weeks (around 01/22/2020) for chronic disease f/u; CKD, DM, HTN.

## 2019-12-25 ENCOUNTER — Other Ambulatory Visit: Payer: Self-pay

## 2019-12-25 ENCOUNTER — Encounter: Payer: Self-pay | Admitting: Nurse Practitioner

## 2019-12-25 ENCOUNTER — Ambulatory Visit (INDEPENDENT_AMBULATORY_CARE_PROVIDER_SITE_OTHER): Payer: Medicare HMO | Admitting: Nurse Practitioner

## 2019-12-25 VITALS — BP 153/84 | HR 98 | Temp 98.5°F | Wt 296.2 lb

## 2019-12-25 DIAGNOSIS — M79605 Pain in left leg: Secondary | ICD-10-CM | POA: Diagnosis not present

## 2019-12-25 DIAGNOSIS — G4700 Insomnia, unspecified: Secondary | ICD-10-CM

## 2019-12-25 DIAGNOSIS — E1122 Type 2 diabetes mellitus with diabetic chronic kidney disease: Secondary | ICD-10-CM

## 2019-12-25 DIAGNOSIS — M79604 Pain in right leg: Secondary | ICD-10-CM | POA: Insufficient documentation

## 2019-12-25 DIAGNOSIS — N186 End stage renal disease: Secondary | ICD-10-CM

## 2019-12-25 MED ORDER — DICLOFENAC SODIUM 1 % EX GEL: 2.0000 g | Freq: Four times a day (QID) | CUTANEOUS | 1 refills | Status: DC | PRN

## 2019-12-25 MED ORDER — TRAZODONE HCL 50 MG PO TABS: 50.0000 mg | ORAL_TABLET | Freq: Every evening | ORAL | 0 refills | Status: DC | PRN

## 2019-12-25 NOTE — Assessment & Plan Note (Addendum)
Acute, ongoing.  Unclear etiology.  Will obtain bilateral ultrasounds to rule out DVT.  High suspicion for intermittent claudication and/or peripheral artery disease.  If ultrasound negative, will refer to vascular.  Encouraged patient to keep legs elevated as much as possible, and to use compression socks.  Can start Voltaren gel to see if this helps with pain.

## 2019-12-25 NOTE — Assessment & Plan Note (Signed)
Chronic, ongoing.  Will place referral to podiatry today for ongoing foot care.  Follow-up in 4 weeks for chronic conditions.

## 2019-12-25 NOTE — Assessment & Plan Note (Signed)
Chronic, ongoing.  Previously on Ambien 10 mg nightly with reportedly good response.  Has not tried alternate medications such as trazodone.  Will start trial of trazodone and follow-up in 4 weeks.  May also want to repeat sleep study as patient reports has been diagnosed with sleep apnea in the past.

## 2019-12-25 NOTE — Patient Instructions (Signed)

## 2019-12-27 ENCOUNTER — Other Ambulatory Visit: Payer: Medicare Other

## 2019-12-29 ENCOUNTER — Other Ambulatory Visit: Payer: Self-pay

## 2019-12-29 ENCOUNTER — Telehealth: Payer: Medicare Other | Admitting: General Practice

## 2019-12-29 ENCOUNTER — Ambulatory Visit (INDEPENDENT_AMBULATORY_CARE_PROVIDER_SITE_OTHER): Payer: Medicare HMO | Admitting: General Practice

## 2019-12-29 ENCOUNTER — Telehealth: Payer: Self-pay | Admitting: Podiatry

## 2019-12-29 ENCOUNTER — Ambulatory Visit (INDEPENDENT_AMBULATORY_CARE_PROVIDER_SITE_OTHER): Payer: Medicare HMO | Admitting: Podiatry

## 2019-12-29 ENCOUNTER — Encounter: Payer: Self-pay | Admitting: Podiatry

## 2019-12-29 DIAGNOSIS — R252 Cramp and spasm: Secondary | ICD-10-CM | POA: Diagnosis not present

## 2019-12-29 DIAGNOSIS — I110 Hypertensive heart disease with heart failure: Secondary | ICD-10-CM

## 2019-12-29 DIAGNOSIS — N186 End stage renal disease: Secondary | ICD-10-CM | POA: Diagnosis not present

## 2019-12-29 DIAGNOSIS — E785 Hyperlipidemia, unspecified: Secondary | ICD-10-CM | POA: Diagnosis not present

## 2019-12-29 DIAGNOSIS — M79604 Pain in right leg: Secondary | ICD-10-CM

## 2019-12-29 DIAGNOSIS — M79674 Pain in right toe(s): Secondary | ICD-10-CM

## 2019-12-29 DIAGNOSIS — E1122 Type 2 diabetes mellitus with diabetic chronic kidney disease: Secondary | ICD-10-CM

## 2019-12-29 DIAGNOSIS — M79675 Pain in left toe(s): Secondary | ICD-10-CM

## 2019-12-29 DIAGNOSIS — I509 Heart failure, unspecified: Secondary | ICD-10-CM

## 2019-12-29 DIAGNOSIS — G4700 Insomnia, unspecified: Secondary | ICD-10-CM

## 2019-12-29 DIAGNOSIS — B351 Tinea unguium: Secondary | ICD-10-CM

## 2019-12-29 DIAGNOSIS — E0843 Diabetes mellitus due to underlying condition with diabetic autonomic (poly)neuropathy: Secondary | ICD-10-CM | POA: Diagnosis not present

## 2019-12-29 MED ORDER — CYCLOBENZAPRINE HCL 10 MG PO TABS: 10.0000 mg | ORAL_TABLET | Freq: Three times a day (TID) | ORAL | 0 refills | Status: DC | PRN

## 2019-12-29 NOTE — Progress Notes (Signed)
   SUBJECTIVE Patient with a history of diabetes mellitus presents to office today complaining of elongated, thickened nails that cause pain while ambulating in shoes.  He is unable to trim his own nails.   Patient also states that he has some pain to the posterior aspect of the Achilles tendon area.  Pain is been present for the last few months now.  Bilateral pain.  Patient denies injury to the area.  Acute onset.  He has not done anything for treatment.  Patient is here for further evaluation and treatment.   Past Medical History:  Diagnosis Date  . Chronic kidney disease   . Congestive heart failure ([place])   . Diabetes mellitus without complication ([place])   . Hyperlipidemia   . Hypertension     OBJECTIVE General Patient is awake, alert, and oriented x 3 and in no acute distress. Derm Skin is dry and supple bilateral. Negative open lesions or macerations. Remaining integument unremarkable. Nails are tender, long, thickened and dystrophic with subungual debris, consistent with onychomycosis, 1-5 bilateral. No signs of infection noted. Vasc  DP and PT pedal pulses palpable bilaterally. Temperature gradient within normal limits.  Neuro Epicritic and protective threshold sensation diminished bilaterally.  Musculoskeletal Exam No symptomatic pedal deformities noted bilateral. Muscular strength within normal limits.  There is some pain on palpation along the Achilles tendon bilateral lower extremities.  ASSESSMENT 1. Diabetes Mellitus w/ peripheral neuropathy 2. Onychomycosis of nail due to dermatophyte bilateral 3. Pain in foot bilateral 4.  Achilles tendinitis  PLAN OF CARE 1. Patient evaluated today. 2. Instructed to maintain good pedal hygiene and foot care. Stressed importance of controlling blood sugar.  3. Mechanical debridement of nails 1-5 bilaterally performed using a nail nipper. Filed with dremel without incident.  4.  Continue gabapentin as per prescribing physician  5.   Prescription for Flexeril 10 mg 3 times daily as needed leg pain and cramping to see if it alleviates the Achilles tendon strain  6.  Return to clinic in 3 mos.     Edrick Kins, DPM [facility]  Dr. Edrick Kins, [place]                                        [place], [place] [address]                Office [phone]  Fax [phone]

## 2019-12-29 NOTE — Telephone Encounter (Signed)
Patient called inquiring about prescription being sent over to pharmacy

## 2019-12-29 NOTE — Chronic Care Management (AMB) (Addendum)
Chronic Care Management   Follow Up Note   12/29/2019 Name: Jovon Winterhalter MRN: [id number] DOB: 03/17/1960  Referred by: Venita Lick, NP Reason for referral : Chronic Care Management (RNCM Follow up call for Chronic Disease Management and Care Coordination Needs)   Skyeler Smola is a 59 y.o. year old male who is a primary care patient of Cannady, Barbaraann Faster, NP. The CCM team was consulted for assistance with chronic disease management and care coordination needs.    Review of patient status, including review of consultants reports, relevant laboratory and other test results, and collaboration with appropriate care team members and the patient's provider was performed as part of comprehensive patient evaluation and provision of chronic care management services.    SDOH (Social Determinants of Health) assessments performed: Yes See Care Plan activities for detailed interventions related to [facility])     Outpatient Encounter Medications as of 12/29/2019  Medication Sig  . amiodarone (PACERONE) 200 MG tablet Take 200 mg by mouth 2 (two) times daily.  . Blood Glucose Monitoring Suppl (ONETOUCH VERIO) w/Device KIT Use to check blood sugar 4 times a day.  . Blood Pressure Monitoring (BLOOD PRESSURE MONITOR AUTOMAT) DEVI Use to check blood pressure twice daily.  . bumetanide (BUMEX) 1 MG tablet Take 1 mg by mouth 2 (two) times daily.  . carvedilol (COREG) 3.125 MG tablet Take 3.125 mg by mouth 2 (two) times daily.  . diclofenac Sodium (VOLTAREN) 1 % GEL Apply 2 g topically 4 (four) times daily as needed (leg pain).  Marland Kitchen ELIQUIS 2.5 MG TABS tablet Take 2.5 mg by mouth 2 (two) times daily. Sig: Daily as presciribed.  . furosemide (LASIX) 80 MG tablet Take 80 mg by mouth 2 (two) times daily.  Marland Kitchen gabapentin (NEURONTIN) 300 MG capsule Take 300 mg by mouth 2 (two) times daily.   Marland Kitchen glucose blood (ONETOUCH VERIO) test strip Use to check blood sugar 4 times a day.  Marland Kitchen HYDROcodone-acetaminophen  (NORCO) 10-325 MG tablet Take 1 tablet by mouth 2 (two) times daily as needed. Only takes after Dialysis (Patient not taking: Reported on 12/25/2019)  . isosorbide mononitrate (IMDUR) 30 MG 24 hr tablet Take 30 mg by mouth daily.  . Lancets (ONETOUCH ULTRASOFT) lancets Use to check blood sugar 4 times a day.  Marland Kitchen LEVEMIR FLEXTOUCH 100 UNIT/ML FlexPen Inject 20 Units into the skin daily. Patient is prescribed 20 units daily. Does not take with regularity per Daughter  . losartan (COZAAR) 100 MG tablet Take 100 mg by mouth daily.  Marland Kitchen NIFEdipine (PROCARDIA-XL/NIFEDICAL-XL) 30 MG 24 hr tablet Take 30 mg by mouth daily.  . rosuvastatin (CRESTOR) 40 MG tablet Take 40 mg by mouth daily.  . sevelamer carbonate (RENVELA) 800 MG tablet Take 1,600 mg by mouth 3 (three) times daily.  . traZODone (DESYREL) 50 MG tablet Take 1 tablet (50 mg total) by mouth at bedtime as needed for sleep.  Marland Kitchen zolpidem (AMBIEN) 10 MG tablet Take 10 mg by mouth at bedtime as needed.  (Patient not taking: Reported on 12/25/2019)   No facility-administered encounter medications on file as of 12/29/2019.     Objective:  BP Readings from Last 3 Encounters:  12/25/19 (!) 153/84  11/13/19 (!) 154/90  11/10/19 120/72    Goals Addressed              This Visit's Progress   .  RNCM: Pt- "I had my blood sugar checked this am at dialysis" (pt-stated)  CARE PLAN ENTRY (see longtitudinal plan of care for additional care plan information)  Objective:  . No results found for: HGBA1C . No results found for: CREATININE . No results found for: EGFR  Current Barriers:  Marland Kitchen Knowledge Deficits related to basic Diabetes pathophysiology and self care/management . Knowledge Deficits related to medications used for management of diabetes . Does not have glucometer to monitor blood sugar- only has blood sugars checked at dialysis- says he does not have a meter . Literacy barriers . Knowledge Deficits related to self administration of  insulin . Limited Social Support  Case Manager Clinical Goal(s):  Over the next 120 days, patient will demonstrate improved adherence to prescribed treatment plan for diabetes self care/management as evidenced by:  . daily monitoring and recording of CBG  . adherence to ADA/ carb modified diet . exercise 7 days/week . adherence to prescribed medication regimen  Interventions:  . Provided education to patient about basic DM disease process . Reviewed medications with patient and discussed importance of medication adherence.  12-29-2019: The patient states that he is compliant with his medications and his daughter helps with medication management.  . Discussed plans with patient for ongoing care management follow up and provided patient with direct contact information for care management team . Provided patient with written educational materials related to hypo and hyperglycemia and importance of correct treatment . Advised patient, providing education and rationale, to check cbg daily and record, calling pcp for findings outside established parameters. 12-29-2019: The patient had not taken his blood sugar this am but states it is good. Could not provide the RNCM with numbers. Ask for patient to take and write numbers down for the [facility] to have on outreach calls.  . Referral made to pharmacy team for assistance with support and education on chronic conditions.  12-29-2019:  Ongoing support and education.  . Referral made to social work team for assistance with help with noncompliance and social needs.  12-29-2019: the patient declined LCSW assistance.  . Review of patient status, including review of consultants reports, relevant laboratory and other test results, and medications completed. . Collaboration with the pcp and pharmacist to get a glucose meter script sent to [facility] so the patient can check blood sugars at home.  12-29-2019: The patient states that he has the means to check blood  sugars at home. Could not provided numbers for the [facility] but states it is "good"  Patient Self Care Activities:  . UNABLE to independently manage diabetes  . Self administers oral medications as prescribed . Self administers insulin as prescribed . Checks blood sugars as prescribed and utilize hyper and hypoglycemia protocol as needed  Please see past updates related to this goal by clicking on the "Past Updates" button in the selected goal      .  RNCM: Pt-"I get my blood pressure checked at dialysis" (pt-stated)        CARE PLAN ENTRY (see longtitudinal plan of care for additional care plan information)  Current Barriers:  . Chronic Disease Management support, education, and care coordination needs related to Atrial Fibrillation, CHF, CAD, HTN, HLD, and ESRD  Clinical Goal(s) related to Atrial Fibrillation, CHF, CAD, HTN, HLD, and ESRD:  Over the next 120 days, patient will:  . Work with the care management team to address educational, disease management, and care coordination needs  . Begin or continue self health monitoring activities as directed today Measure and record blood pressure 2/3 times per week, Measure and  record weight daily, and adhere to a heart healthy/ADA diet . Call provider office for new or worsened signs and symptoms Blood pressure findings outside established parameters, Weight outside established parameters, Oxygen saturation lower than established parameter, Chest pain, Shortness of breath, and New or worsened symptom related to ESRD and other chronic conditions.  . Call care management team with questions or concerns . Verbalize basic understanding of patient centered plan of care established today  Interventions related to Atrial Fibrillation, CHF, CAD, HTN, HLD, and ESRD:  . Evaluation of current treatment plans and patient's adherence to plan as established by provider.  The patient states he has dialysis 4 days a week. Could not give the [facility] specific  schedule. States he gets his blood pressure checked there. Says he does not have a way to check it at home. The patient denies headache or issues with following plan of care. 12-29-2019: The patient is still complaining of leg pain and discomfort. States the gel is not helping. The patient is going to the podiatrist today to see what the podiatrist thinks of pain and to get recommendations. Education on writing down questions to ask the provider at the visit so his concerns could be addressed. The patient has seen improvements in his sleep patterns since he has started taking the Trazadone. Eduction and support given.  . Assessed patient understanding of disease states. The patient states that he understands his conditions but when asking questions about his conditions he does not know the answers to why he takes a certain medication or what readings he is having for his blood pressure at dialysis. Does live with his daughter and she helps him with medications. 12-29-2019: The patient states that he had the COVID vaccine in [place] and will get the record for the [facility] and report on next outreach. States he is doing well except for the pain in the calves of his legs.  Denies any concerns with other chronic conditions.  . Assessed patient's education and care coordination needs.  The patient verbalized that he does not have a blood pressure cuff or a glucose meter. Will follow up with the pcp and pharmacist concerning a script for this. Likely he will have to get a blood pressure cuff through the over the counter product book.  . Provided disease specific education to patient.  Education on heart healthy/ADA diet. The patient states he does not eat salt and he watches his sugar intake. Eats baked foods. 12-29-2019: The patient states he is compliant with heart healthy/ADA diet. Will continue to monitor.  Nash Dimmer with appropriate clinical care team members regarding patient needs.  The patient is currently  working with the pharmacist and has an upcoming appointment with the LCSW. 12-29-2019- declined services of LCSW. Continues to work with [employer]. Will collaborate as needed with the CCM pharmacist.  . Evaluation of upcoming appointments:  Sees podiatrist on 12-29-2019. Will follow up with the pcp on 01-22-2020.    Patient Self Care Activities related to Atrial Fibrillation, CHF, CAD, HTN, HLD, and ESRD:  . Patient is unable to independently self-manage chronic health conditions  Please see past updates related to this goal by clicking on the "Past Updates" button in the selected goal          Plan:   Telephone follow up appointment with care management team member scheduled for:  02-16-2020 at 1200 noon.    Noreene Larsson RN, MSN, [facility] Mobile: [phone]   

## 2019-12-29 NOTE — Patient Instructions (Signed)
Visit Information  Goals Addressed              This Visit's Progress   .  RNCM: Pt- "I had my blood sugar checked this am at dialysis" (pt-stated)        CARE PLAN ENTRY (see longtitudinal plan of care for additional care plan information)  Objective:  . No results found for: HGBA1C . No results found for: CREATININE . No results found for: EGFR  Current Barriers:  Marland Kitchen Knowledge Deficits related to basic Diabetes pathophysiology and self care/management . Knowledge Deficits related to medications used for management of diabetes . Does not have glucometer to monitor blood sugar- only has blood sugars checked at dialysis- says he does not have a meter . Literacy barriers . Knowledge Deficits related to self administration of insulin . Limited Social Support  Case Manager Clinical Goal(s):  Over the next 120 days, patient will demonstrate improved adherence to prescribed treatment plan for diabetes self care/management as evidenced by:  . daily monitoring and recording of CBG  . adherence to ADA/ carb modified diet . exercise 7 days/week . adherence to prescribed medication regimen  Interventions:  . Provided education to patient about basic DM disease process . Reviewed medications with patient and discussed importance of medication adherence.  12-29-2019: The patient states that he is compliant with his medications and his daughter helps with medication management.  . Discussed plans with patient for ongoing care management follow up and provided patient with direct contact information for care management team . Provided patient with written educational materials related to hypo and hyperglycemia and importance of correct treatment . Advised patient, providing education and rationale, to check cbg daily and record, calling pcp for findings outside established parameters. 12-29-2019: The patient had not taken his blood sugar this am but states it is good. Could not provide the RNCM  with numbers. Ask for patient to take and write numbers down for the [facility] to have on outreach calls.  . Referral made to pharmacy team for assistance with support and education on chronic conditions.  12-29-2019:  Ongoing support and education.  . Referral made to social work team for assistance with help with noncompliance and social needs.  12-29-2019: the patient declined LCSW assistance.  . Review of patient status, including review of consultants reports, relevant laboratory and other test results, and medications completed. . Collaboration with the pcp and pharmacist to get a glucose meter script sent to [facility] so the patient can check blood sugars at home.  12-29-2019: The patient states that he has the means to check blood sugars at home. Could not provided numbers for the [facility] but states it is "good"  Patient Self Care Activities:  . UNABLE to independently manage diabetes  . Self administers oral medications as prescribed . Self administers insulin as prescribed . Checks blood sugars as prescribed and utilize hyper and hypoglycemia protocol as needed  Please see past updates related to this goal by clicking on the "Past Updates" button in the selected goal      .  RNCM: Pt-"I get my blood pressure checked at dialysis" (pt-stated)        CARE PLAN ENTRY (see longtitudinal plan of care for additional care plan information)  Current Barriers:  . Chronic Disease Management support, education, and care coordination needs related to Atrial Fibrillation, CHF, CAD, HTN, HLD, and ESRD  Clinical Goal(s) related to Atrial Fibrillation, CHF, CAD, HTN, HLD, and ESRD:  Over the next  120 days, patient will:  . Work with the care management team to address educational, disease management, and care coordination needs  . Begin or continue self health monitoring activities as directed today Measure and record blood pressure 2/3 times per week, Measure and record weight daily, and adhere to a  heart healthy/ADA diet . Call provider office for new or worsened signs and symptoms Blood pressure findings outside established parameters, Weight outside established parameters, Oxygen saturation lower than established parameter, Chest pain, Shortness of breath, and New or worsened symptom related to ESRD and other chronic conditions.  . Call care management team with questions or concerns . Verbalize basic understanding of patient centered plan of care established today  Interventions related to Atrial Fibrillation, CHF, CAD, HTN, HLD, and ESRD:  . Evaluation of current treatment plans and patient's adherence to plan as established by provider.  The patient states he has dialysis 4 days a week. Could not give the [facility] specific schedule. States he gets his blood pressure checked there. Says he does not have a way to check it at home. The patient denies headache or issues with following plan of care. 12-29-2019: The patient is still complaining of leg pain and discomfort. States the gel is not helping. The patient is going to the podiatrist today to see what the podiatrist thinks of pain and to get recommendations. Education on writing down questions to ask the provider at the visit so his concerns could be addressed. The patient has seen improvements in his sleep patterns since he has started taking the Trazadone. Eduction and support given.  . Assessed patient understanding of disease states. The patient states that he understands his conditions but when asking questions about his conditions he does not know the answers to why he takes a certain medication or what readings he is having for his blood pressure at dialysis. Does live with his daughter and she helps him with medications. 12-29-2019: The patient states that he had the COVID vaccine in [place] and will get the record for the [facility] and report on next outreach. States he is doing well except for the pain in the calves of his legs.  Denies any  concerns with other chronic conditions.  . Assessed patient's education and care coordination needs.  The patient verbalized that he does not have a blood pressure cuff or a glucose meter. Will follow up with the pcp and pharmacist concerning a script for this. Likely he will have to get a blood pressure cuff through the over the counter product book.  . Provided disease specific education to patient.  Education on heart healthy/ADA diet. The patient states he does not eat salt and he watches his sugar intake. Eats baked foods. 12-29-2019: The patient states he is compliant with heart healthy/ADA diet. Will continue to monitor.  Nash Dimmer with appropriate clinical care team members regarding patient needs.  The patient is currently working with the pharmacist and has an upcoming appointment with the LCSW. 12-29-2019- declined services of LCSW. Continues to work with [employer]. Will collaborate as needed with the CCM pharmacist.  . Evaluation of upcoming appointments:  Sees podiatrist on 12-29-2019. Will follow up with the pcp on 01-22-2020.    Patient Self Care Activities related to Atrial Fibrillation, CHF, CAD, HTN, HLD, and ESRD:  . Patient is unable to independently self-manage chronic health conditions  Please see past updates related to this goal by clicking on the "Past Updates" button in the selected goal  Patient verbalizes understanding of instructions provided today.   Telephone follow up appointment with care management team member scheduled for: 02-16-2020 at 1200 noon  [place], MSN, [facility] Mobile: [phone]

## 2020-01-01 ENCOUNTER — Telehealth: Payer: Self-pay | Admitting: Podiatry

## 2020-01-01 ENCOUNTER — Other Ambulatory Visit: Payer: Self-pay | Admitting: Podiatry

## 2020-01-01 MED ORDER — CYCLOBENZAPRINE HCL 10 MG PO TABS: 10.0000 mg | ORAL_TABLET | Freq: Three times a day (TID) | ORAL | 0 refills | Status: DC | PRN

## 2020-01-01 NOTE — Telephone Encounter (Signed)
Patient said his pharmacy never received his mediation on Friday. Can you please resend?

## 2020-01-01 NOTE — Telephone Encounter (Signed)
Just resent it. - Dr. Amalia Hailey

## 2020-01-05 ENCOUNTER — Telehealth: Payer: Self-pay

## 2020-01-05 NOTE — Telephone Encounter (Signed)
Pt was last seen 10/11 by Janett Billow  Copied from [place] [id number]. Topic: General - Other >> Jan 05, 2020 12:26 PM Yvette Rack wrote: Reason for CRM: Pt stated the cream that he was given for leg pain is not working. Pt requests call back.

## 2020-01-05 NOTE — Telephone Encounter (Signed)
Routing to provider to advise. Diclofenac is not helping patient's leg pain.

## 2020-01-06 NOTE — Progress Notes (Deleted)
Cardiology Office Note    Date:  01/06/2020   ID:  Adam Howell, DOB 09-06-1960, MRN [id number]  PCP:  Venita Lick, NP  Cardiologist:  Nelva Bush, MD  Electrophysiologist:  None   Chief Complaint: Follow up  History of Present Illness:   Adam Howell is a 59 y.o. male with history of HFrEF with details being uncertain, Afib of uncertain chronicity, ESRD on HD, DM, HTN, and HLD who presents for follow up of ***.  He recently moved from [place] to [place] earlier in 2021. He was seen as a new patient by Dr. Saunders Revel on 11/10/2019 for evaluation of hypertensive heart disease and ED. At his initial visit with cardiology, he did not know specifics of his cardiac history, other than being diagnosed with "congestive heart failure" and having an irregular heart beat. He did not recall undergoing prior stress testing or cardiac cath. He did not recall being told he was diagnosed with Afib, though did not have any other explanation for being on anticoagulation. He did not remember who his cardiologist was and reported seeing that person only once, though was noted to be on Eliquis and amiodarone. Of note, Eliquis dosing was noted to be subtherapeutic, though again details of potential reasons for this were not known. He was noted to be in Afib. Medication changes were deferred given lack of available records. He was advised to follow up in 1 month. Records from his PCP in [place] were sent, though did not provide specifics regarding his cardiac history. Echo appointment has been cancelled twice. He underwent echo on 01/10/2020 that showed an EF of 35-40%, global hypokinesis, severe concentric LVH, low normal RVSF with normal ventricular cavity size, mildly elevated PASP estimated at 38 mmHg, moderately dilated left atrium, mildly dilated right atrium, and no significant valvular abnormalities.   ***   Labs independently reviewed: None available for review  Past Medical History:    Diagnosis Date  . Chronic kidney disease   . Congestive heart failure ([place])   . Diabetes mellitus without complication ([place])   . Hyperlipidemia   . Hypertension     No past surgical history on file.  Current Medications: No outpatient medications have been marked as taking for the 01/15/20 encounter (Appointment) with Rise Mu, PA-C.    Allergies:   Tramadol   Social History   Socioeconomic History  . Marital status: Single    Spouse name: Not on file  . Number of children: Not on file  . Years of education: Not on file  . Highest education level: Not on file  Occupational History  . Not on file  Tobacco Use  . Smoking status: Never Smoker  . Smokeless tobacco: Never Used  Vaping Use  . Vaping Use: Never used  Substance and Sexual Activity  . Alcohol use: Not Currently  . Drug use: Never  . Sexual activity: Not Currently  Other Topics Concern  . Not on file  Social History Narrative  . Not on file   Social Determinants of Health   Financial Resource Strain: Low Risk   . Difficulty of Paying Living Expenses: Not hard at all  Food Insecurity: No Food Insecurity  . Worried About Charity fundraiser in the Last Year: Never true  . Ran Out of Food in the Last Year: Never true  Transportation Needs: No Transportation Needs  . Lack of Transportation (Medical): No  . Lack of Transportation (Non-Medical): No  Physical Activity: Sufficiently  Active  . Days of Exercise per Week: 7 days  . Minutes of Exercise per Session: 30 min  Stress: No Stress Concern Present  . Feeling of Stress : Not at all  Social Connections: Unknown  . Frequency of Communication with Friends and Family: More than three times a week  . Frequency of Social Gatherings with Friends and Family: More than three times a week  . Attends Religious Services: Never  . Active Member of Clubs or Organizations: No  . Attends Archivist Meetings: Never  . Marital Status: Patient refused      Family History:  The patient's family history includes Cirrhosis in his father; Heart failure in his mother; Hypertension in his daughter.  ROS:   ROS   EKGs/Labs/Other Studies Reviewed:    Studies reviewed were summarized above. The additional studies were reviewed today:  2D echo 01/10/2020: ***  EKG:  EKG is ordered today.  The EKG ordered today demonstrates ***  Recent Labs: No results found for requested labs within last 8760 hours.  Recent Lipid Panel No results found for: CHOL, TRIG, HDL, CHOLHDL, VLDL, LDLCALC, LDLDIRECT  PHYSICAL EXAM:    VS:  There were no vitals taken for this visit.  BMI: There is no height or weight on file to calculate BMI.  Physical Exam  Wt Readings from Last 3 Encounters:  12/25/19 296 lb 3.2 oz (134.4 kg)  11/13/19 279 lb 5.2 oz (126.7 kg)  11/10/19 286 lb (129.7 kg)     ASSESSMENT & PLAN:   1. ***  Disposition: F/u with Dr. Saunders Revel in ***.   Medication Adjustments/Labs and Tests Ordered: Current medicines are reviewed at length with the patient today.  Concerns regarding medicines are outlined above. Medication changes, Labs and Tests ordered today are summarized above and listed in the Patient Instructions accessible in Encounters.   Signed, Christell Faith, PA-C 01/06/2020 9:30 AM     Nathalie [address] [place] [address] [place], [place] [address] [phone]

## 2020-01-08 NOTE — Telephone Encounter (Signed)
Called and spoke with patient. He states that he has been doing elevation and the compression socks but has not heard about the ultrasounds.   Adam Howell, can you look into this please? Bilateral ultrasounds ordered on 12/25/19.

## 2020-01-08 NOTE — Telephone Encounter (Signed)
At last OV, I ordered bilateral lower extremity ultrasound - this has not been done yet.  Do we know why?  Has he been trying elevation or compression socks?  I can refer to vascular if he would like now but would prefer for him to have the ultrasound done first.

## 2020-01-09 ENCOUNTER — Telehealth: Payer: Self-pay | Admitting: Pharmacist

## 2020-01-09 NOTE — Chronic Care Management (AMB) (Signed)
Chronic Care Management Pharmacy Assistant   Name: Adam Howell  MRN: [id number] DOB: 07-Oct-1960  Reason for Encounter: Follow up with patient due to no show on 12-18-19.   Patient Questions:  1.  Have you seen any other providers since your last visit? Yes, 12/25/2019 with Adam Chapel, NP, 12/29/2019 with Adam Katayama, MD (podiatry)  2.  Any changes in your medicines or health? Yes, see note below.     PCP : Adam Lick, NP  Allergies:   Allergies  Allergen Reactions  . Tramadol Rash    Medications: Outpatient Encounter Medications as of 01/09/2020  Medication Sig  . amiodarone (PACERONE) 200 MG tablet Take 200 mg by mouth 2 (two) times daily.  . Blood Glucose Monitoring Suppl (ONETOUCH VERIO) w/Device KIT Use to check blood sugar 4 times a day.  . Blood Pressure Monitoring (BLOOD PRESSURE MONITOR AUTOMAT) DEVI Use to check blood pressure twice daily.  . bumetanide (BUMEX) 1 MG tablet Take 1 mg by mouth 2 (two) times daily.  . carvedilol (COREG) 3.125 MG tablet Take 3.125 mg by mouth 2 (two) times daily.  . cyclobenzaprine (FLEXERIL) 10 MG tablet Take 1 tablet (10 mg total) by mouth 3 (three) times daily as needed for muscle spasms.  . diclofenac Sodium (VOLTAREN) 1 % GEL Apply 2 g topically 4 (four) times daily as needed (leg pain).  Marland Kitchen ELIQUIS 2.5 MG TABS tablet Take 2.5 mg by mouth 2 (two) times daily. Sig: Daily as presciribed.  . furosemide (LASIX) 80 MG tablet Take 80 mg by mouth 2 (two) times daily.  Marland Kitchen gabapentin (NEURONTIN) 300 MG capsule Take 300 mg by mouth 2 (two) times daily.   Marland Kitchen glucose blood (ONETOUCH VERIO) test strip Use to check blood sugar 4 times a day.  Marland Kitchen HYDROcodone-acetaminophen (NORCO) 10-325 MG tablet Take 1 tablet by mouth 2 (two) times daily as needed. Only takes after Dialysis (Patient not taking: Reported on 12/25/2019)  . isosorbide mononitrate (IMDUR) 30 MG 24 hr tablet Take 30 mg by mouth daily.  . Lancets (ONETOUCH ULTRASOFT)  lancets Use to check blood sugar 4 times a day.  Marland Kitchen LEVEMIR FLEXTOUCH 100 UNIT/ML FlexPen Inject 20 Units into the skin daily. Patient is prescribed 20 units daily. Does not take with regularity per Daughter  . losartan (COZAAR) 100 MG tablet Take 100 mg by mouth daily.  Marland Kitchen NIFEdipine (PROCARDIA-XL/NIFEDICAL-XL) 30 MG 24 hr tablet Take 30 mg by mouth daily.  . rosuvastatin (CRESTOR) 40 MG tablet Take 40 mg by mouth daily.  . sevelamer carbonate (RENVELA) 800 MG tablet Take 1,600 mg by mouth 3 (three) times daily.  . traZODone (DESYREL) 50 MG tablet Take 1 tablet (50 mg total) by mouth at bedtime as needed for sleep.   No facility-administered encounter medications on file as of 01/09/2020.    Current Diagnosis: Patient Active Problem List   Diagnosis Date Noted  . Pain in both lower extremities 12/25/2019  . Atrial fibrillation ([place]) 11/11/2019  . Essential hypertension 11/11/2019  . Hyperlipidemia 11/11/2019  . Erectile dysfunction 11/11/2019  . Cough 11/07/2019  . Heart failure ([place]) 10/26/2019  . Type 2 diabetes mellitus with ESRD (end-stage renal disease) ([place]) 10/04/2019  . Chronic pain syndrome 10/04/2019  . Insomnia 10/04/2019  . Atherosclerotic heart disease of native coronary artery without angina pectoris 09/01/2019  . Hypertensive heart disease with heart failure ([place]) 09/01/2019  . Morbid obesity ([place]) 09/01/2019  . Anemia in chronic kidney disease 07/25/2019  . End  stage renal disease ([place]) 07/25/2019  . Hypertensive kidney disease with ESRD on dialysis ([place]) 07/25/2019  . Secondary hyperparathyroidism of renal origin ([place]) 07/25/2019    Goals Addressed   None    The patient was seen by Adam Chapel, NP on 12-25-2019 related to pain in both lower extremities and insomnia. Per Epic chart review, the patient's blood pressure was noted at 153/84. NP ordered a bilateral ultrasound to rule out DVT. Also instructed the patient to elevate legs and use compression socks.  Also started Voltaren Gel as needed for pain. Related to insomnia patient was prescribed Trazodone 50 mg as needed.   On 12-29-2019 Patient was seen by Adam Katayama, MD (podiatry). Where mechanical debridement was performed on 1-5 bilateral toenails. Ordered Flexeril 10 mg three times daily as needed for leg pain and cramping.   Spoke with the patient in regards to checking blood pressures and blood sugars. The patient explained he was currently in dialysis and "they" check his blood pressure while he is receiving dialysis. The patient could not provide a blood pressure reading during our phone call. Expressed he does not check his blood pressure at home due to not having a machine. I explained I could look into ordering him a blood pressure machine, but patient expressed "no, not at this time".  Also explained he has not been checking his blood sugars at this time. Reported he has a cardiologist appointment tomorrow (01/10/20). Per Epic chart review patient has a scheduled ultrasound appointment on 01-16-20 at 1:00pm. Attempted to reschedule patient with Adam Howell, [facility] since he missed his scheduled appointment on 12-18-2019. The patient explained he would call the office after dialysis to reschedule his appointment.   Adam Ned, LPN Clinical Pharmacist Assistant  [phone]    Follow-Up:  Provide follow-up call following month (November 2021) to discuss blood pressure and blood sugars.

## 2020-01-10 ENCOUNTER — Other Ambulatory Visit: Payer: Self-pay

## 2020-01-10 ENCOUNTER — Ambulatory Visit (INDEPENDENT_AMBULATORY_CARE_PROVIDER_SITE_OTHER): Payer: Medicaid Other

## 2020-01-10 DIAGNOSIS — I509 Heart failure, unspecified: Secondary | ICD-10-CM

## 2020-01-10 DIAGNOSIS — I4891 Unspecified atrial fibrillation: Secondary | ICD-10-CM | POA: Diagnosis not present

## 2020-01-10 LAB — ECHOCARDIOGRAM COMPLETE
AR max vel: 2.27 cm2
AV Area VTI: 1.94 cm2
AV Area mean vel: 2.05 cm2
AV Mean grad: 3 mmHg
AV Peak grad: 5.3 mmHg
Ao pk vel: 1.15 m/s
S' Lateral: 4.3 cm

## 2020-01-10 MED ORDER — PERFLUTREN LIPID MICROSPHERE
1.0000 mL | INTRAVENOUS | Status: AC | PRN
  Administered 2020-01-10: 2 mL via INTRAVENOUS

## 2020-01-11 ENCOUNTER — Telehealth: Payer: Self-pay | Admitting: *Deleted

## 2020-01-11 NOTE — Telephone Encounter (Signed)
-----   Message from Nelva Bush, MD sent at 01/11/2020  6:29 AM EDT ----- Please let Adam Howell know that his echocardiogram shows that his heart pumping function is moderately reduced with severe left ventricular wall thickening.  I suggest that he continue his current medications and follow-up as planned next week to reassess his symptoms and discuss further medication changes and ischemia testing.

## 2020-01-11 NOTE — Telephone Encounter (Signed)
No answer. Left message to call back.   

## 2020-01-12 NOTE — Telephone Encounter (Signed)
The patient has been notified of the result and verbalized understanding.  All questions (if any) were answered. Eliberto Ivory Sharmain Lastra, RN 01/12/2020 11:08 AM

## 2020-01-15 ENCOUNTER — Ambulatory Visit: Payer: Medicaid Other | Admitting: Physician Assistant

## 2020-01-16 ENCOUNTER — Encounter: Payer: Self-pay | Admitting: Physician Assistant

## 2020-01-16 ENCOUNTER — Ambulatory Visit: Admission: RE | Admit: 2020-01-16 | Payer: Medicare HMO | Source: Ambulatory Visit

## 2020-01-19 ENCOUNTER — Encounter: Payer: Self-pay | Admitting: Nurse Practitioner

## 2020-01-19 DIAGNOSIS — D6869 Other thrombophilia: Secondary | ICD-10-CM | POA: Insufficient documentation

## 2020-01-22 ENCOUNTER — Other Ambulatory Visit: Payer: Self-pay

## 2020-01-22 ENCOUNTER — Ambulatory Visit (INDEPENDENT_AMBULATORY_CARE_PROVIDER_SITE_OTHER): Payer: Medicare HMO | Admitting: Nurse Practitioner

## 2020-01-22 ENCOUNTER — Encounter: Payer: Self-pay | Admitting: Nurse Practitioner

## 2020-01-22 VITALS — BP 150/78 | HR 88 | Temp 98.5°F | Ht 74.0 in | Wt 292.0 lb

## 2020-01-22 DIAGNOSIS — I4891 Unspecified atrial fibrillation: Secondary | ICD-10-CM

## 2020-01-22 DIAGNOSIS — N185 Chronic kidney disease, stage 5: Secondary | ICD-10-CM

## 2020-01-22 DIAGNOSIS — E1122 Type 2 diabetes mellitus with diabetic chronic kidney disease: Secondary | ICD-10-CM

## 2020-01-22 DIAGNOSIS — Z992 Dependence on renal dialysis: Secondary | ICD-10-CM

## 2020-01-22 DIAGNOSIS — I5020 Unspecified systolic (congestive) heart failure: Secondary | ICD-10-CM | POA: Diagnosis not present

## 2020-01-22 DIAGNOSIS — E785 Hyperlipidemia, unspecified: Secondary | ICD-10-CM

## 2020-01-22 DIAGNOSIS — N186 End stage renal disease: Secondary | ICD-10-CM

## 2020-01-22 DIAGNOSIS — I132 Hypertensive heart and chronic kidney disease with heart failure and with stage 5 chronic kidney disease, or end stage renal disease: Secondary | ICD-10-CM | POA: Diagnosis not present

## 2020-01-22 DIAGNOSIS — F5101 Primary insomnia: Secondary | ICD-10-CM

## 2020-01-22 DIAGNOSIS — D6869 Other thrombophilia: Secondary | ICD-10-CM

## 2020-01-22 DIAGNOSIS — E1169 Type 2 diabetes mellitus with other specified complication: Secondary | ICD-10-CM

## 2020-01-22 DIAGNOSIS — N2581 Secondary hyperparathyroidism of renal origin: Secondary | ICD-10-CM

## 2020-01-22 DIAGNOSIS — D631 Anemia in chronic kidney disease: Secondary | ICD-10-CM

## 2020-01-22 DIAGNOSIS — L989 Disorder of the skin and subcutaneous tissue, unspecified: Secondary | ICD-10-CM | POA: Insufficient documentation

## 2020-01-22 LAB — BAYER DCA HB A1C WAIVED: HB A1C (BAYER DCA - WAIVED): 6.7 % (ref <7.0)

## 2020-01-22 MED ORDER — TRIAMCINOLONE ACETONIDE 0.1 % EX CREA: 1.0000 "application " | TOPICAL_CREAM | Freq: Two times a day (BID) | CUTANEOUS | 0 refills | Status: DC

## 2020-01-22 NOTE — Assessment & Plan Note (Signed)
Chronic, ongoing, followed by nephrology and dialysis teams.  Continue current medication regimen as prescribed by them.  Attempt to obtain recent notes from [place].  Obtain CBC today.   Return in 3 months.

## 2020-01-22 NOTE — Assessment & Plan Note (Signed)
Chronic, ongoing.  Continue current medication regimen and adjust as needed. Lipid panel today. 

## 2020-01-22 NOTE — Patient Instructions (Signed)

## 2020-01-22 NOTE — Assessment & Plan Note (Signed)
Chronic, ongoing.  Have not received previous PCP records as of yet.  Continue current medication regimen and collaboration with local cardiology.  Return to office in 3 months.

## 2020-01-22 NOTE — Assessment & Plan Note (Signed)
Chronic, ongoing with A1C 6.7% today.  At goal.  Continue current medication regimen and adjust as needed.  Recommend he monitor BS at least a couple times a day and document for provider + focus on diabetic diet.  CCM referral is in place.  Return to office in 3 months for follow-up.  He was unable to obtain urine sample today for ALB check.

## 2020-01-22 NOTE — Assessment & Plan Note (Signed)
Chronic, ongoing.  Will continue Trazodone, which is offering benefit.  Adjust regimen as needed.  Return in 3 months.

## 2020-01-22 NOTE — Assessment & Plan Note (Signed)
BMI 37.49 with T2DM, ESRD, HF.  Recommended eating smaller high protein, low fat meals more frequently and exercising 30 mins a day 5 times a week with a goal of 10-15lb weight loss in the next 3 months. Patient voiced their understanding and motivation to adhere to these recommendations.

## 2020-01-22 NOTE — Assessment & Plan Note (Signed)
To bilateral lower calves.  ?nummular dermatitis.  Will send in steroid cream script.  Referral to dermatology for further evaluation of areas, patient higher risk would benefit from visit.

## 2020-01-22 NOTE — Progress Notes (Signed)
BP (!) 150/78   Pulse 88   Temp 98.5 F (36.9 C) (Oral)   Ht 6\' 2"  (1.88 m)   Wt 292 lb (132.5 kg)   SpO2 97%   BMI 37.49 kg/m    Subjective:    [name], male    DOB: 28-Jul-1960, 59 y.o.   MRN: [id number]  HPI: Adam Howell is a 59 y.o. male  Chief Complaint  Patient presents with  . Diabetes  . Hypertension  . Chronic Kidney Disease  . area of concern    Left post calf, Right shin has been there for awhile, just started hurting about a week ago   HYPERTENSION / HYPERLIPIDEMIA/HF Taking Losartan 100 MG daily, Eliquis, Amiodarone, Carvedilol 3.125 MG BID, Procardia 30 MG daily, Lasix 80 MG BID, Renvela, Imdur, and Bumex.  Takes Crestor daily for HLD.  Takes Gabapentin 300 MG BID.  He saw Dr. Saunders Revel with cardiology on 11/10/19 -- awaiting records from his provider in [place] to further evaluate his cardiac history before medication changes.  Recent EF on 01/10/20 was 35-40%.   Satisfied with current treatment? yes Duration of hypertension: chronic BP monitoring frequency: not checking BP range:  BP medication side effects: no Duration of hyperlipidemia: chronic Cholesterol medication side effects: no Cholesterol supplements: none Medication compliance: good compliance Aspirin: no Recent stressors: no Recurrent headaches: no Visual changes: no Palpitations: no Dyspnea: no Chest pain: no Lower extremity edema: no Dizzy/lightheaded: no   DIABETES Diagnosed many years ago.  Continues Levemir 20 units daily.  Has labs at dialysis and recent A1C on his records from him is 6.4% in June 2021.  Range since August 2020 -- 5.6 to 6.8%. Hypoglycemic episodes:yes Polydipsia/polyuria: no Visual disturbance: no Chest pain: no Paresthesias: no Glucose Monitoring: yes             Accucheck frequency: once a day             Fasting glucose: <130             Post prandial:             Evening:             Before meals: Taking Insulin?: yes              Long acting insulin: Levemir 3 units             Short acting insulin: Blood Pressure Monitoring: not checking Retinal Examination: Not up to Date Foot Exam: Not up to Date Pneumovax: Up to Date was given at dialysis Influenza: Up to Date Aspirin: no   CHRONIC KIDNEY DISEASE End stage renal disease and obtains dialysis.  Goes to dialysis Tuesday, Wednesday, Thursday, and Saturday.  He reports taking Norco, has been on long term, for post dialysis discomfort.   He requests refills today, discussed office policy with him.  Reviewed PDMP last fill Ambien 08/27/19 for 30 tablets and Norco 07/11/19 for 60 tablets by [name] in [place]. CKD status: stable Medications renally dose: yes Previous renal evaluation: yes Pneumovax:  Up to Date Influenza Vaccine:  Up to Date   INSOMNIA Taking Trazodone as needed for sleep. Duration: chronic Satisfied with sleep quality: yes Difficulty falling asleep: yes Difficulty staying asleep: no Waking a few hours after sleep onset: no Early morning awakenings: no Daytime hypersomnolence: no Wakes feeling refreshed: yes Good sleep hygiene: yes Apnea: no Snoring: no Depressed/anxious mood: no Recent stress: no Restless legs/nocturnal leg cramps:  no Chronic pain/arthritis: no History of sleep study: no Treatments attempted: ambien   SKIN LESION Reports they have been present for a few weeks to bilateral lower legs and they cause discomfort at times. Duration: weeks Location:  Right and left calves Painful: irritation Itching: sometimes Onset: sudden Context: not changing Associated signs and symptoms: none History of skin cancer: no History of precancerous skin lesions: no Family history of skin cancer: no  Relevant past medical, surgical, family and social history reviewed and updated as indicated. Interim medical history since our last visit reviewed. Allergies and medications reviewed and updated.  Review of Systems    Constitutional: Negative for activity change, diaphoresis, fatigue and fever.  Respiratory: Negative for cough, chest tightness, shortness of breath and wheezing.   Cardiovascular: Negative for chest pain, palpitations and leg swelling.  Gastrointestinal: Negative.   Endocrine: Negative for polydipsia, polyphagia and polyuria.  Neurological: Negative.   Psychiatric/Behavioral: Negative.     Per HPI unless specifically indicated above     Objective:    BP (!) 150/78   Pulse 88   Temp 98.5 F (36.9 C) (Oral)   Ht 6\' 2"  (1.88 m)   Wt 292 lb (132.5 kg)   SpO2 97%   BMI 37.49 kg/m   Wt Readings from Last 3 Encounters:  01/22/20 292 lb (132.5 kg)  12/25/19 296 lb 3.2 oz (134.4 kg)  11/13/19 279 lb 5.2 oz (126.7 kg)    Physical Exam Vitals and nursing note reviewed.  Constitutional:      General: He is awake. He is not in acute distress.    Appearance: He is well-developed and well-groomed. He is morbidly obese. He is not ill-appearing.  HENT:     Head: Normocephalic and atraumatic.     Right Ear: Hearing normal. No drainage.     Left Ear: Hearing normal. No drainage.  Eyes:     General: Lids are normal.        Right eye: No discharge.        Left eye: No discharge.     Conjunctiva/sclera: Conjunctivae normal.     Pupils: Pupils are equal, round, and reactive to light.  Neck:     Thyroid: No thyromegaly.     Vascular: No carotid bruit.     Trachea: Trachea normal.  Cardiovascular:     Rate and Rhythm: Normal rate and regular rhythm.     Heart sounds: Normal heart sounds, S1 normal and S2 normal. No murmur heard.  No gallop.      Arteriovenous access: left arteriovenous access is present. Pulmonary:     Effort: Pulmonary effort is normal. No accessory muscle usage or respiratory distress.     Breath sounds: Normal breath sounds.  Abdominal:     General: Bowel sounds are normal.     Palpations: Abdomen is soft. There is no hepatomegaly or splenomegaly.   Musculoskeletal:        General: Normal range of motion.     Cervical back: Normal range of motion and neck supple.     Right lower leg: No edema.     Left lower leg: No edema.  Skin:    General: Skin is warm and dry.     Capillary Refill: Capillary refill takes less than 2 seconds.     Findings: No rash.     Comments: Two round, raised skin lesions noted, x 1 to posterior left calf and x 1 to lateral right calf.  Both approx 1 to 1.5  cm with dried, scaly raised area around exterior and flat interior.  Exterior deep [place]uz and interior paler.  Bilateral lower extremities with xerosis.  Neurological:     Mental Status: [name] and oriented to person, place, and time.     Deep Tendon Reflexes: Reflexes are normal and symmetric.  Psychiatric:        Attention and Perception: Attention normal.        Mood and Affect: Mood normal.        Speech: Speech normal.        Behavior: Behavior normal. Behavior is cooperative.        Thought Content: Thought content normal.        Judgment: Judgment normal.     Results for orders placed or performed in visit on 01/10/20  ECHOCARDIOGRAM COMPLETE  Result Value Ref Range   AR max vel 2.27 cm2   AV Peak grad 5.3 mmHg   Ao pk vel 1.15 m/s   S' Lateral 4.30 cm   AV Area VTI 1.94 cm2   AV Mean grad 3.0 mmHg   AV Area mean vel 2.05 cm2      Assessment & Plan:   Problem List Items Addressed This Visit      Cardiovascular and Mediastinum   Hypertensive heart and kidney disease with heart failure and chronic kidney disease stage V (HCC)    Chronic, ongoing, followed by nephrology and dialysis teams.  BP initial elevated, but reports lows with dialysis -- will monitor closely.  Continue current medication regimen as prescribed by them.  Attempt to obtain recent notes from [place].  Labs today CMP and CBC.  Recommend he monitor BP at home a few days a week and document + focus on DASH diet.  Return to office in 3 months.       Relevant Orders    TSH   Heart failure with reduced ejection fraction (HCC)    Chronic, stable, euvolemic today.  Continue current medication regimen and adjust as needed.  Continue collaboration with cardiology and attempt to obtain previous PCP records. Recommend: - Reminded to call for an overnight weight gain of >2 pounds or a weekly weight weight of >5 pounds - not adding salt to his food and has been reading food labels. Reviewed the importance of keeping daily sodium intake to 2000mg  daily       Atrial fibrillation (HCC)    Chronic, ongoing.  Have not received previous PCP records as of yet.  Continue current medication regimen and collaboration with local cardiology.  Return to office in 3 months.        Endocrine   Secondary hyperparathyroidism of renal origin ([place])    Chronic, ongoing with ESRD.  Continue collaboration with dialysis team and nephrology, attempt to obtain recent notes from [place].      Type 2 diabetes mellitus with ESRD (end-stage renal disease) ([place]) - Primary    Chronic, ongoing with A1C 6.7% today.  At goal.  Continue current medication regimen and adjust as needed.  Recommend he monitor BS at least a couple times a day and document for provider + focus on diabetic diet.  CCM referral is in place.  Return to office in 3 months for follow-up.  He was unable to obtain urine sample today for ALB check.      Relevant Orders   Bayer DCA Hb A1c Waived   Comprehensive metabolic panel   CBC with Differential/Platelet   Hyperlipidemia associated with type 2  diabetes mellitus (HCC)    Chronic, ongoing.  Continue current medication regimen and adjust as needed.  Lipid panel today.         Relevant Orders   Bayer DCA Hb A1c Waived   Lipid Panel w/o Chol/HDL Ratio     Musculoskeletal and Integument   Skin lesions    To bilateral lower calves.  ?nummular dermatitis.  Will send in steroid cream script.  Referral to dermatology for further evaluation of areas, patient higher risk  would benefit from visit.      Relevant Orders   Ambulatory referral to Dermatology     Genitourinary   End stage renal disease ([place])    Chronic, ongoing, followed by nephrology and dialysis teams.  Unsure about diet compliance as tends to eat fast food on occasion.  Continue current medication regimen as prescribed by specialist.  Attempt to obtain recent notes from [place].  Return to office in 3 months for follow-up.  Obtain CBC, CMP, and TSH today, as baseline for current records.        Hematopoietic and Hemostatic   Other thrombophilia ([place])    With atrial fibrillation and on Eliquis.  CBC today.  Continue to collaborate with cardiology and continue current medication regimen.        Other   Anemia in chronic kidney disease    Chronic, ongoing, followed by nephrology and dialysis teams.  Continue current medication regimen as prescribed by them.  Attempt to obtain recent notes from [place].  Obtain CBC today.   Return in 3 months.      Morbid obesity (HCC)    BMI 37.49 with T2DM, ESRD, HF.  Recommended eating smaller high protein, low fat meals more frequently and exercising 30 mins a day 5 times a week with a goal of 10-15lb weight loss in the next 3 months. Patient voiced their understanding and motivation to adhere to these recommendations.       Insomnia    Chronic, ongoing.  Will continue Trazodone, which is offering benefit.  Adjust regimen as needed.  Return in 3 months.          Follow up plan: Return in about 3 months (around 04/23/2020) for T2DM, HTN/HLD, CKD5, INSOMNIA, CHRONIC PAIN.

## 2020-01-22 NOTE — Assessment & Plan Note (Signed)
Chronic, ongoing, followed by nephrology and dialysis teams.  Unsure about diet compliance as tends to eat fast food on occasion.  Continue current medication regimen as prescribed by specialist.  Attempt to obtain recent notes from [place].  Return to office in 3 months for follow-up.  Obtain CBC, CMP, and TSH today, as baseline for current records.

## 2020-01-22 NOTE — Assessment & Plan Note (Signed)
Chronic, ongoing with ESRD.  Continue collaboration with dialysis team and nephrology, attempt to obtain recent notes from [facility]. 

## 2020-01-22 NOTE — Assessment & Plan Note (Signed)
Chronic, ongoing, followed by nephrology and dialysis teams.  BP initial elevated, but reports lows with dialysis -- will monitor closely.  Continue current medication regimen as prescribed by them.  Attempt to obtain recent notes from [place].  Labs today CMP and CBC.  Recommend he monitor BP at home a few days a week and document + focus on DASH diet.  Return to office in 3 months.

## 2020-01-22 NOTE — Assessment & Plan Note (Signed)
With atrial fibrillation and on Eliquis.  CBC today.  Continue to collaborate with cardiology and continue current medication regimen.

## 2020-01-22 NOTE — Assessment & Plan Note (Addendum)
Chronic, stable, euvolemic today.  Continue current medication regimen and adjust as needed.  Continue collaboration with cardiology and attempt to obtain previous PCP records. Recommend: - Reminded to call for an overnight weight gain of >2 pounds or a weekly weight weight of >5 pounds - not adding salt to his food and has been reading food labels. Reviewed the importance of keeping daily sodium intake to 2000mg  daily

## 2020-01-23 LAB — COMPREHENSIVE METABOLIC PANEL

## 2020-01-23 LAB — LIPID PANEL W/O CHOL/HDL RATIO

## 2020-01-25 LAB — CBC WITH DIFFERENTIAL/PLATELET
Basophils Absolute: 0.1 10*3/uL (ref 0.0–0.2)
Basos: 1 %
EOS (ABSOLUTE): 0.3 10*3/uL (ref 0.0–0.4)
Eos: 5 %
Hematocrit: 35.4 % — ABNORMAL LOW (ref 37.5–51.0)
Hemoglobin: 11.2 g/dL — ABNORMAL LOW (ref 13.0–17.7)
Immature Grans (Abs): 0 10*3/uL (ref 0.0–0.1)
Immature Granulocytes: 1 %
Lymphocytes Absolute: 1.5 10*3/uL (ref 0.7–3.1)
Lymphs: 24 %
MCH: 26.9 pg (ref 26.6–33.0)
MCHC: 31.6 g/dL (ref 31.5–35.7)
MCV: 85 fL (ref 79–97)
Monocytes Absolute: 0.7 10*3/uL (ref 0.1–0.9)
Monocytes: 11 %
Neutrophils Absolute: 3.7 10*3/uL (ref 1.4–7.0)
Neutrophils: 58 %
Platelets: 250 10*3/uL (ref 150–450)
RBC: 4.16 x10E6/uL (ref 4.14–5.80)
RDW: 16.8 % — ABNORMAL HIGH (ref 11.6–15.4)
WBC: 6.2 10*3/uL (ref 3.4–10.8)

## 2020-01-25 LAB — TSH

## 2020-01-25 LAB — COMPREHENSIVE METABOLIC PANEL

## 2020-01-25 LAB — LIPID PANEL W/O CHOL/HDL RATIO

## 2020-01-25 NOTE — Progress Notes (Signed)
Please let Gian know labs have returned and only CBC was read as remainder of labs did not have enough specimen to run, so we will recheck next visit.  CBC does show some mild anemia, most likely from his kidney disease, and we will recheck this next visit. Have a great day. Keep being awesome!!  Thank you for allowing me to participate in your care. Kindest regards, Henrine Screws'

## 2020-02-02 ENCOUNTER — Ambulatory Visit (INDEPENDENT_AMBULATORY_CARE_PROVIDER_SITE_OTHER): Payer: Medicare HMO | Admitting: Nurse Practitioner

## 2020-02-02 ENCOUNTER — Other Ambulatory Visit: Payer: Self-pay

## 2020-02-02 ENCOUNTER — Encounter: Payer: Self-pay | Admitting: Nurse Practitioner

## 2020-02-02 VITALS — BP 169/93 | HR 99 | Temp 98.5°F | Wt 293.2 lb

## 2020-02-02 DIAGNOSIS — L97919 Non-pressure chronic ulcer of unspecified part of right lower leg with unspecified severity: Secondary | ICD-10-CM | POA: Insufficient documentation

## 2020-02-02 DIAGNOSIS — L97909 Non-pressure chronic ulcer of unspecified part of unspecified lower leg with unspecified severity: Secondary | ICD-10-CM | POA: Diagnosis not present

## 2020-02-02 DIAGNOSIS — L97911 Non-pressure chronic ulcer of unspecified part of right lower leg limited to breakdown of skin: Secondary | ICD-10-CM | POA: Insufficient documentation

## 2020-02-02 MED ORDER — LIDOCAINE 5 % EX CREA: 1.0000 "application " | TOPICAL_CREAM | Freq: Every day | CUTANEOUS | 0 refills | Status: DC

## 2020-02-02 MED ORDER — HYDROCODONE-ACETAMINOPHEN 10-325 MG PO TABS: 1.0000 | ORAL_TABLET | Freq: Four times a day (QID) | ORAL | 0 refills | Status: DC | PRN

## 2020-02-02 NOTE — Progress Notes (Signed)
BP (!) 169/93    Pulse 99    Temp 98.5 F (36.9 C)    Wt 293 lb 3.2 oz (133 kg)    SpO2 96%    BMI 37.64 kg/m    Subjective:    [name], male    DOB: 29-Jul-1960, 59 y.o.   MRN: [id number]  HPI: Adam Howell is a 59 y.o. male  Chief Complaint  Patient presents with   skin concern    pt states he has two areas on his legs, one on back of left leg, one on right left leg that are very painful for a few weeks    SKIN LESION Reports they have been present for a few weeks to bilateral lower legs and they cause discomfort at times.  One on back of left leg and one right leg, they are painful.  Placed dermatology referral last visit and he reports no one has called.  Was ordered Triamcinolone cream last visit with no benefit.  Areas have changed since last visit per patient report Duration: weeks Location:  Right and left calves Painful: irritation Itching: sometimes Onset: sudden Context: not changing Associated signs and symptoms: none History of skin cancer: no History of precancerous skin lesions: no Family history of skin cancer: no  Relevant past medical, surgical, family and social history reviewed and updated as indicated. Interim medical history since our last visit reviewed. Allergies and medications reviewed and updated.  Review of Systems  Constitutional: Negative for activity change, diaphoresis, fatigue and fever.  Respiratory: Negative for cough, chest tightness, shortness of breath and wheezing.   Cardiovascular: Negative for chest pain, palpitations and leg swelling.  Gastrointestinal: Negative.   Endocrine: Negative for polydipsia, polyphagia and polyuria.  Skin: Positive for wound.  Neurological: Negative.   Psychiatric/Behavioral: Negative.     Per HPI unless specifically indicated above     Objective:    BP (!) 169/93    Pulse 99    Temp 98.5 F (36.9 C)    Wt 293 lb 3.2 oz (133 kg)    SpO2 96%    BMI 37.64 kg/m   Wt  Readings from Last 3 Encounters:  02/02/20 293 lb 3.2 oz (133 kg)  01/22/20 292 lb (132.5 kg)  12/25/19 296 lb 3.2 oz (134.4 kg)    Physical Exam Vitals and nursing note reviewed.  Constitutional:      General: He is awake. He is not in acute distress.    Appearance: He is well-developed and well-groomed. He is morbidly obese. He is not ill-appearing.  HENT:     Head: Normocephalic and atraumatic.     Right Ear: Hearing normal. No drainage.     Left Ear: Hearing normal. No drainage.  Eyes:     General: Lids are normal.        Right eye: No discharge.        Left eye: No discharge.     Conjunctiva/sclera: Conjunctivae normal.     Pupils: Pupils are equal, round, and reactive to light.  Neck:     Thyroid: No thyromegaly.     Vascular: No carotid bruit.     Trachea: Trachea normal.  Cardiovascular:     Rate and Rhythm: Normal rate and regular rhythm.     Pulses:          Dorsalis pedis pulses are 1+ on the right side and 1+ on the left side.       Posterior tibial  pulses are 1+ on the right side and 1+ on the left side.     Heart sounds: Normal heart sounds, S1 normal and S2 normal. No murmur heard.  No gallop.      Arteriovenous access: left arteriovenous access is present. Pulmonary:     Effort: Pulmonary effort is normal. No accessory muscle usage or respiratory distress.     Breath sounds: Normal breath sounds.  Abdominal:     General: Bowel sounds are normal.     Palpations: Abdomen is soft. There is no hepatomegaly or splenomegaly.  Musculoskeletal:        General: Normal range of motion.     Cervical back: Normal range of motion and neck supple.     Right lower leg: No edema.     Left lower leg: No edema.  Feet:     Right foot:     Protective Sensation: 7 sites tested. 10 sites sensed.     Skin integrity: Dry skin present.     Toenail Condition: Right toenails are abnormally thick.     Left foot:     Protective Sensation: 6 sites tested. 10 sites sensed.     Skin  integrity: Dry skin present.     Toenail Condition: Left toenails are abnormally thick.  Skin:    General: Skin is warm and dry.     Capillary Refill: Capillary refill takes less than 2 seconds.     Findings: No rash.     Comments: Two round, raised skin lesions noted, x 1 to posterior left calf and x 1 to lateral right calf.  Both 1/2 cm with dried, raised crusting area to interior.  Exterior deep Tarr, tenderness to touch.  Bilateral lower extremities with xerosis and pulses diminished.  Neurological:     Mental Status: [name] and oriented to person, place, and time.     Deep Tendon Reflexes: Reflexes are normal and symmetric.  Psychiatric:        Attention and Perception: Attention normal.        Mood and Affect: Mood normal.        Speech: Speech normal.        Behavior: Behavior normal. Behavior is cooperative.        Thought Content: Thought content normal.        Judgment: Judgment normal.     Results for orders placed or performed in visit on 01/22/20  Bayer DCA Hb A1c Waived  Result Value Ref Range   HB A1C (BAYER DCA - WAIVED) 6.7 <7.0 %  Lipid Panel w/o Chol/HDL Ratio  Result Value Ref Range   Cholesterol, Total CANCELED mg/dL   Triglycerides CANCELED    HDL CANCELED    VLDL Cholesterol Cal CANCELED mg/dL  TSH  Result Value Ref Range   TSH CANCELED uIU/mL  Comprehensive metabolic panel  Result Value Ref Range   Glucose CANCELED mg/dL   BUN CANCELED    Creatinine, Ser CANCELED    Sodium CANCELED    Potassium CANCELED    Chloride CANCELED    CO2 CANCELED    Calcium CANCELED    Total Protein CANCELED    Albumin CANCELED    Bilirubin Total CANCELED    Alkaline Phosphatase CANCELED    AST CANCELED    ALT CANCELED   CBC with Differential/Platelet  Result Value Ref Range   WBC 6.2 3.4 - 10.8 x10E3/uL   RBC 4.16 4.14 - 5.80 x10E6/uL   Hemoglobin 11.2 (L) 13.0 -  17.7 g/dL   Hematocrit 35.4 (L) 37.5 - 51.0 %   MCV 85 79 - 97 fL   MCH 26.9 26.6 - 33.0 pg    MCHC 31.6 31 - 35 g/dL   RDW 16.8 (H) 11.6 - 15.4 %   Platelets 250 150 - 450 x10E3/uL   Neutrophils 58 Not Estab. %   Lymphs 24 Not Estab. %   Monocytes 11 Not Estab. %   Eos 5 Not Estab. %   Basos 1 Not Estab. %   Neutrophils Absolute 3.7 1.40 - 7.00 x10E3/uL   Lymphocytes Absolute 1.5 0 - 3 x10E3/uL   Monocytes Absolute 0.7 0 - 0 x10E3/uL   EOS (ABSOLUTE) 0.3 0.0 - 0.4 x10E3/uL   Basophils Absolute 0.1 0 - 0 x10E3/uL   Immature Granulocytes 1 Not Estab. %   Immature Grans (Abs) 0.0 0.0 - 0.1 x10E3/uL      Assessment & Plan:   Problem List Items Addressed This Visit      Cardiovascular and Mediastinum   Vasculitic ulcer of lower extremity ([place]) - Primary    To bilateral lower calves, suspect ulceration due to poor circulation with his diabetes.  Urgent referral placed to wound care and vascular.  Will send in short course of Norco for pain as needed, has taken in past without issue, and Lidocaine cream.  At this time advised him not to touch areas, as crusting is present.  May require some debridement to further assess.  Return in 2 weeks for follow-up, sooner if worsening.      Relevant Orders   Ambulatory referral to [facility]   Ambulatory referral to Vascular Surgery       Follow up plan: Return in about 2 weeks (around 02/16/2020) for Ulcer check legs.

## 2020-02-02 NOTE — Assessment & Plan Note (Signed)
To bilateral lower calves, suspect ulceration due to poor circulation with his diabetes.  Urgent referral placed to wound care and vascular.  Will send in short course of Norco for pain as needed, has taken in past without issue, and Lidocaine cream.  At this time advised him not to touch areas, as crusting is present.  May require some debridement to further assess.  Return in 2 weeks for follow-up, sooner if worsening.

## 2020-02-02 NOTE — Patient Instructions (Signed)
Venous Ulcer A venous ulcer is a shallow sore on your lower leg. Venous ulcer is the most common type of lower leg ulcer. You may have venous ulcers on one leg or on both legs. This condition most often develops around your ankles. This type of ulcer may last for a long time (chronic ulcer) or it may return often (recurrent ulcer). What are the causes? This condition is caused by poor blood flow in your legs. The poor flow causes blood to pool in your legs. This can break the skin, causing an ulcer. What increases the risk? You are more likely to develop this condition if:  You are 52 years of age or older.  You are male.  You are overweight.  You are not active.  You have had a leg ulcer in the past.  You have varicose veins.  You have clots in your lower leg veins (deep vein thrombosis).  You have inflammation of your leg veins (phlebitis).  You have recently been pregnant.  You smoke. What are the signs or symptoms? The main symptom of this condition is an open sore near your ankle. Other symptoms may include:  Swelling.  Thick skin.  Fluid coming from the ulcer.  Bleeding.  Itching.  Pain and swelling. This gets worse when you stand up and feels better when you raise your leg.  Blotchy skin.  Dark skin. How is this treated? This condition may be treated by:  Keeping your leg raised (elevated).  Wearing a type of bandage or stocking to keep pressure (compression) on the veins of your leg.  Taking medicines, including antibiotic medicines.  Cleaning your ulcer and removing any dead tissue from the wound.  Using bandages and wraps that have medicines in them to cover your ulcer.  Closing the wound using a piece of skin taken from another area of your body (graft). Follow these instructions at home: Medicines  Take or apply over-the-counter and prescription medicines only as told by your doctor.  If you were prescribed an antibiotic medicine, take it  as told by your doctor. Do not stop using the antibiotic even if you start to feel better.  Ask your doctor if you should take aspirin before long trips. Wound care  Follow instructions from your doctor about how to take care of your wound. Make sure you: ? Wash your hands with soap and water before and after you change your bandage (dressing). If you cannot use soap and water, use hand sanitizer. ? Change your bandage as told by your doctor. ? If you had a skin graft, leave stitches (sutures) in place. These may need to stay in place for 2 weeks or longer. ? Ask when you should remove your bandage. If your bandage is dry and sticks to your leg when you try to remove it, moisten or wet the bandage with saline solution or water to make it easier to remove.  Once your bandage is off, check your wound each day for signs of infection. Have a caregiver do this for you if you are not able to do it yourself. Check for: ? More redness, swelling, or pain. ? More fluid or blood. ? Warmth. ? Pus or a bad smell. Activity  Do not sit for a long time without moving. Get up to take short walks every 1-2 hours. This is important. Ask for help if you feel weak or unsteady.  Ask your doctor what level of activity is safe for you.  Rest with  your legs raised during the day. If you can, keep your legs above the level of your heart for 30 minutes, 3-4 times a day, or as told by your doctor.  Do not sit with your legs crossed. General instructions   Wear elastic stockings, compression stockings, or support hose as told by your doctor.  Raise the foot of your bed as told by your doctor.  Do not use any products that contain nicotine or tobacco, such as cigarettes, e-cigarettes, and chewing tobacco. If you need help quitting, ask your doctor.  Keep all follow-up visits as told by your doctor. This is important. Contact a doctor if:  Your ulcer is getting larger or is not healing.  Your pain gets  worse. Get help right away if:  You have more redness, swelling, or pain around your ulcer.  You have more fluid or blood coming from your ulcer.  Your ulcer feels warm to the touch.  You have pus or a bad smell coming from your ulcer.  You have a fever. Summary  A venous ulcer is a shallow sore on your lower leg.  Follow instructions from your doctor about how to take care of your wound.  Check your wound each day for signs of infection.  Take over-the-counter and prescription medicines only as told by your doctor.  Keep all follow-up visits as told by your doctor. This is important. This information is not intended to replace advice given to you by your health care provider. Make sure you discuss any questions you have with your health care provider. Document Revised: 10/28/2017 Document Reviewed: 10/28/2017 Elsevier Patient Education  [place].

## 2020-02-12 ENCOUNTER — Telehealth: Payer: Self-pay | Admitting: Nurse Practitioner

## 2020-02-12 NOTE — Telephone Encounter (Signed)
Patient is calling to request a referral for transportation from [employer] for his 03/06/20 to the pain management clinic [place]. Please advise with patient [phone]

## 2020-02-14 ENCOUNTER — Telehealth: Payer: Self-pay

## 2020-02-14 NOTE — Telephone Encounter (Signed)
Pam do you know who I would put this referral through to for ride?  Would it be the [employer] [facility]?

## 2020-02-14 NOTE — Telephone Encounter (Signed)
Routing to provider  

## 2020-02-14 NOTE — Telephone Encounter (Signed)
Copied from [place] [id number]. Topic: Referral - Request for Referral >> Feb 14, 2020  9:07 AM Scherrie Gerlach wrote: Pt states he needs referral for his ride to wound clinic on 04/09/2020. Ride place phone no: [phone]

## 2020-02-16 ENCOUNTER — Ambulatory Visit: Payer: Self-pay | Admitting: General Practice

## 2020-02-16 ENCOUNTER — Telehealth: Payer: Medicare Other | Admitting: General Practice

## 2020-02-16 DIAGNOSIS — N186 End stage renal disease: Secondary | ICD-10-CM

## 2020-02-16 DIAGNOSIS — E1169 Type 2 diabetes mellitus with other specified complication: Secondary | ICD-10-CM

## 2020-02-16 DIAGNOSIS — I5020 Unspecified systolic (congestive) heart failure: Secondary | ICD-10-CM

## 2020-02-16 DIAGNOSIS — G894 Chronic pain syndrome: Secondary | ICD-10-CM

## 2020-02-16 DIAGNOSIS — I4891 Unspecified atrial fibrillation: Secondary | ICD-10-CM

## 2020-02-16 DIAGNOSIS — M79605 Pain in left leg: Secondary | ICD-10-CM

## 2020-02-16 NOTE — Patient Instructions (Signed)
Visit Information  Goals Addressed              This Visit's Progress   .  RNCM: Pt- "I had my blood sugar checked this am at dialysis" (pt-stated)        CARE PLAN ENTRY (see longtitudinal plan of care for additional care plan information)  Objective:  . No results found for: HGBA1C . No results found for: CREATININE . No results found for: EGFR  Current Barriers:  Marland Kitchen Knowledge Deficits related to basic Diabetes pathophysiology and self care/management . Knowledge Deficits related to medications used for management of diabetes . Does not have glucometer to monitor blood sugar- only has blood sugars checked at dialysis- says he does not have a meter . Literacy barriers . Knowledge Deficits related to self administration of insulin . Limited Social Support  Case Manager Clinical Goal(s):  Over the next 120 days, patient will demonstrate improved adherence to prescribed treatment plan for diabetes self care/management as evidenced by:  . daily monitoring and recording of CBG  . adherence to ADA/ carb modified diet . exercise 7 days/week . adherence to prescribed medication regimen  Interventions:  . Provided education to patient about basic DM disease process . Reviewed medications with patient and discussed importance of medication adherence.  02-16-2020: The patient states that he is compliant with his medications and his daughter helps with medication management.  . Discussed plans with patient for ongoing care management follow up and provided patient with direct contact information for care management team . Provided patient with written educational materials related to hypo and hyperglycemia and importance of correct treatment.  02-16-2020: The patient states that they check it at dialysis and it is good. Could not give any other readings. Discussed highs and lows. The patient denies any issues.  . Advised patient, providing education and rationale, to check cbg daily and  record, calling pcp for findings outside established parameters. 12-29-2019: The patient had not taken his blood sugar this am but states it is good. Could not provide the RNCM with numbers. Ask for patient to take and write numbers down for the [facility] to have on outreach calls.  . Referral made to pharmacy team for assistance with support and education on chronic conditions.  02-16-2020:  Ongoing support and education.  . Referral made to social work team for assistance with help with noncompliance and social needs.  02-16-2020: the patient declined LCSW assistance.  . Review of patient status, including review of consultants reports, relevant laboratory and other test results, and medications completed. . Collaboration with the pcp and pharmacist to get a glucose meter script sent to [place] so the patient can check blood sugars at home.  02-16-2020: The patient states that he has the means to check blood sugars at home. Could not provided numbers for the [facility] but states it is "good"  Patient Self Care Activities:  . UNABLE to independently manage diabetes  . Self administers oral medications as prescribed . Self administers insulin as prescribed . Checks blood sugars as prescribed and utilize hyper and hypoglycemia protocol as needed  Please see past updates related to this goal by clicking on the "Past Updates" button in the selected goal      .  RNCM: Pt-"I get my blood pressure checked at dialysis" (pt-stated)        CARE PLAN ENTRY (see longtitudinal plan of care for additional care plan information)  Current Barriers:  . Chronic Disease Management support, education,  and care coordination needs related to Atrial Fibrillation, CHF, CAD, HTN, HLD, and ESRD  Clinical Goal(s) related to Atrial Fibrillation, CHF, CAD, HTN, HLD, and ESRD:  Over the next 120 days, patient will:  . Work with the care management team to address educational, disease management, and care coordination needs   . Begin or continue self health monitoring activities as directed today Measure and record blood pressure 2/3 times per week, Measure and record weight daily, and adhere to a heart healthy/ADA diet . Call provider office for new or worsened signs and symptoms Blood pressure findings outside established parameters, Weight outside established parameters, Oxygen saturation lower than established parameter, Chest pain, Shortness of breath, and New or worsened symptom related to ESRD and other chronic conditions.  . Call care management team with questions or concerns . Verbalize basic understanding of patient centered plan of care established today  Interventions related to Atrial Fibrillation, CHF, CAD, HTN, HLD, and ESRD:  . Evaluation of current treatment plans and patient's adherence to plan as established by provider.  The patient states he has dialysis 4 days a week. Could not give the [facility] specific schedule. States he gets his blood pressure checked there. Says he does not have a way to check it at home. The patient denies headache or issues with following plan of care. 02-16-2020: The patient is still complaining of leg pain and discomfort. States the gel is not helping. Education on writing down questions to ask the provider at the visit so his concerns could be addressed. The patient has seen improvements in his sleep patterns since he has started taking the Trazadone. Eduction and support given. The patient sees pcp on 02-26-2020 and pain specialist after Christmas. The patient states he is out of pain medications. Encouraged the patient to discuss with providers options.  . Assessed patient understanding of disease states. The patient states that he understands his conditions but when asking questions about his conditions he does not know the answers to why he takes a certain medication or what readings he is having for his blood pressure at dialysis. Does live with his daughter and she helps him  with medications. 02-16-2020: The patient states that he had the COVID vaccine in [place] and will get the record for the [facility] and report on next outreach. (Still unable to supply at this outreach- reminder given) States he is doing well except for the pain in the calves of his legs.  Denies any concerns with other chronic conditions.  . Assessed patient's education and care coordination needs.  The patient verbalized that he does not have a blood pressure cuff or a glucose meter. Will follow up with the pcp and pharmacist concerning a script for this. Likely he will have to get a blood pressure cuff through the over the counter product book.  . Provided disease specific education to patient.  Education on heart healthy/ADA diet. The patient states he does not eat salt and he watches his sugar intake. Eats baked foods. 02-16-2020: The patient states he is compliant with heart healthy/ADA diet. Will continue to monitor.  Nash Dimmer with appropriate clinical care team members regarding patient needs.  The patient is currently working with the pharmacist and has an upcoming appointment with the LCSW. 02-16-2020- declined services of LCSW. Continues to work with [employer]. Will collaborate as needed with the CCM pharmacist.  . Evaluation of upcoming appointments:  Will follow up with the pcp on 02-26-2020.  Has appointment with the specialist for  pain after Christmas.   Patient Self Care Activities related to Atrial Fibrillation, CHF, CAD, HTN, HLD, and ESRD:  . Patient is unable to independently self-manage chronic health conditions  Please see past updates related to this goal by clicking on the "Past Updates" button in the selected goal         The patient verbalized understanding of instructions, educational materials, and care plan provided today and declined offer to receive copy of patient instructions, educational materials, and care plan.   Telephone follow up appointment with care management team  member scheduled for: 04-12-2020 at 11:45 am  Noreene Larsson RN, MSN, [facility] Mobile: [phone]

## 2020-02-16 NOTE — Chronic Care Management (AMB) (Signed)
Chronic Care Management   Follow Up Note   02/16/2020 Name: Adam Howell MRN: [id number] DOB: [birth year]  Referred by: Venita Lick, NP Reason for referral : Chronic Care Management (RNCM Follow Up: For Chronic Disease and Care Coordination Needs )   Adam Howell is a 59 y.o. year old male who is a primary care patient of Cannady, Barbaraann Faster, NP. The CCM team was consulted for assistance with chronic disease management and care coordination needs.    Review of patient status, including review of consultants reports, relevant laboratory and other test results, and collaboration with appropriate care team members and the patient's provider was performed as part of comprehensive patient evaluation and provision of chronic care management services.    SDOH (Social Determinants of Health) assessments performed: Yes See Care Plan activities for detailed interventions related to [facility])     Outpatient Encounter Medications as of 02/16/2020  Medication Sig  . amiodarone (PACERONE) 200 MG tablet Take 200 mg by mouth 2 (two) times daily.  . Blood Glucose Monitoring Suppl (ONETOUCH VERIO) w/Device KIT Use to check blood sugar 4 times a day.  . Blood Pressure Monitoring (BLOOD PRESSURE MONITOR AUTOMAT) DEVI Use to check blood pressure twice daily.  . bumetanide (BUMEX) 1 MG tablet Take 1 mg by mouth 2 (two) times daily.  . carvedilol (COREG) 3.125 MG tablet Take 3.125 mg by mouth 2 (two) times daily.  . cyclobenzaprine (FLEXERIL) 10 MG tablet Take 1 tablet (10 mg total) by mouth 3 (three) times daily as needed for muscle spasms.  . diclofenac Sodium (VOLTAREN) 1 % GEL Apply 2 g topically 4 (four) times daily as needed (leg pain).  Marland Kitchen ELIQUIS 2.5 MG TABS tablet Take 2.5 mg by mouth 2 (two) times daily. Sig: Daily as presciribed.  . furosemide (LASIX) 80 MG tablet Take 80 mg by mouth 2 (two) times daily.  Marland Kitchen gabapentin (NEURONTIN) 300 MG capsule Take 300 mg by mouth 2 (two) times daily.     Marland Kitchen glucose blood (ONETOUCH VERIO) test strip Use to check blood sugar 4 times a day.  . isosorbide mononitrate (IMDUR) 30 MG 24 hr tablet Take 30 mg by mouth daily.  . Lancets (ONETOUCH ULTRASOFT) lancets Use to check blood sugar 4 times a day.  Marland Kitchen LEVEMIR FLEXTOUCH 100 UNIT/ML FlexPen Inject 20 Units into the skin daily. Patient is prescribed 20 units daily. Does not take with regularity per Daughter  . Lidocaine 5 % CREA Apply 1 application topically daily.  Marland Kitchen losartan (COZAAR) 100 MG tablet Take 100 mg by mouth daily.  Marland Kitchen NIFEdipine (PROCARDIA-XL/NIFEDICAL-XL) 30 MG 24 hr tablet Take 30 mg by mouth daily.  . rosuvastatin (CRESTOR) 40 MG tablet Take 40 mg by mouth daily.  . sevelamer carbonate (RENVELA) 800 MG tablet Take 1,600 mg by mouth 3 (three) times daily.  . traZODone (DESYREL) 50 MG tablet Take 1 tablet (50 mg total) by mouth at bedtime as needed for sleep.  Marland Kitchen triamcinolone cream (KENALOG) 0.1 % Apply 1 application topically 2 (two) times daily.   No facility-administered encounter medications on file as of 02/16/2020.     Objective:  BP Readings from Last 3 Encounters:  02/02/20 (!) 169/93  01/22/20 (!) 150/78  12/25/19 (!) 153/84    Goals Addressed              This Visit's Progress   .  RNCM: Pt- "I had my blood sugar checked this am at dialysis" (pt-stated)  CARE PLAN ENTRY (see longtitudinal plan of care for additional care plan information)  Objective:  . No results found for: HGBA1C . No results found for: CREATININE . No results found for: EGFR  Current Barriers:  Marland Kitchen Knowledge Deficits related to basic Diabetes pathophysiology and self care/management . Knowledge Deficits related to medications used for management of diabetes . Does not have glucometer to monitor blood sugar- only has blood sugars checked at dialysis- says he does not have a meter . Literacy barriers . Knowledge Deficits related to self administration of insulin . Limited Social  Support  Case Manager Clinical Goal(s):  Over the next 120 days, patient will demonstrate improved adherence to prescribed treatment plan for diabetes self care/management as evidenced by:  . daily monitoring and recording of CBG  . adherence to ADA/ carb modified diet . exercise 7 days/week . adherence to prescribed medication regimen  Interventions:  . Provided education to patient about basic DM disease process . Reviewed medications with patient and discussed importance of medication adherence.  02-16-2020: The patient states that he is compliant with his medications and his daughter helps with medication management.  . Discussed plans with patient for ongoing care management follow up and provided patient with direct contact information for care management team . Provided patient with written educational materials related to hypo and hyperglycemia and importance of correct treatment.  02-16-2020: The patient states that they check it at dialysis and it is good. Could not give any other readings. Discussed highs and lows. The patient denies any issues.  . Advised patient, providing education and rationale, to check cbg daily and record, calling pcp for findings outside established parameters. 12-29-2019: The patient had not taken his blood sugar this am but states it is good. Could not provide the RNCM with numbers. Ask for patient to take and write numbers down for the [facility] to have on outreach calls.  . Referral made to pharmacy team for assistance with support and education on chronic conditions.  02-16-2020:  Ongoing support and education.  . Referral made to social work team for assistance with help with noncompliance and social needs.  02-16-2020: the patient declined LCSW assistance.  . Review of patient status, including review of consultants reports, relevant laboratory and other test results, and medications completed. . Collaboration with the pcp and pharmacist to get a glucose meter  script sent to [place] so the patient can check blood sugars at home.  02-16-2020: The patient states that he has the means to check blood sugars at home. Could not provided numbers for the [facility] but states it is "good"  Patient Self Care Activities:  . UNABLE to independently manage diabetes  . Self administers oral medications as prescribed . Self administers insulin as prescribed . Checks blood sugars as prescribed and utilize hyper and hypoglycemia protocol as needed  Please see past updates related to this goal by clicking on the "Past Updates" button in the selected goal      .  RNCM: Pt-"I get my blood pressure checked at dialysis" (pt-stated)        CARE PLAN ENTRY (see longtitudinal plan of care for additional care plan information)  Current Barriers:  . Chronic Disease Management support, education, and care coordination needs related to Atrial Fibrillation, CHF, CAD, HTN, HLD, and ESRD  Clinical Goal(s) related to Atrial Fibrillation, CHF, CAD, HTN, HLD, and ESRD:  Over the next 120 days, patient will:  . Work with the care management team to  address educational, disease management, and care coordination needs  . Begin or continue self health monitoring activities as directed today Measure and record blood pressure 2/3 times per week, Measure and record weight daily, and adhere to a heart healthy/ADA diet . Call provider office for new or worsened signs and symptoms Blood pressure findings outside established parameters, Weight outside established parameters, Oxygen saturation lower than established parameter, Chest pain, Shortness of breath, and New or worsened symptom related to ESRD and other chronic conditions.  . Call care management team with questions or concerns . Verbalize basic understanding of patient centered plan of care established today  Interventions related to Atrial Fibrillation, CHF, CAD, HTN, HLD, and ESRD:  . Evaluation of current treatment plans and  patient's adherence to plan as established by provider.  The patient states he has dialysis 4 days a week. Could not give the [facility] specific schedule. States he gets his blood pressure checked there. Says he does not have a way to check it at home. The patient denies headache or issues with following plan of care. 02-16-2020: The patient is still complaining of leg pain and discomfort. States the gel is not helping. Education on writing down questions to ask the provider at the visit so his concerns could be addressed. The patient has seen improvements in his sleep patterns since he has started taking the Trazadone. Eduction and support given. The patient sees pcp on 02-26-2020 and pain specialist after Christmas. The patient states he is out of pain medications. Encouraged the patient to discuss with providers options.  . Assessed patient understanding of disease states. The patient states that he understands his conditions but when asking questions about his conditions he does not know the answers to why he takes a certain medication or what readings he is having for his blood pressure at dialysis. Does live with his daughter and she helps him with medications. 02-16-2020: The patient states that he had the COVID vaccine in [place] and will get the record for the [facility] and report on next outreach. (Still unable to supply at this outreach- reminder given) States he is doing well except for the pain in the calves of his legs.  Denies any concerns with other chronic conditions.  . Assessed patient's education and care coordination needs.  The patient verbalized that he does not have a blood pressure cuff or a glucose meter. Will follow up with the pcp and pharmacist concerning a script for this. Likely he will have to get a blood pressure cuff through the over the counter product book.  . Provided disease specific education to patient.  Education on heart healthy/ADA diet. The patient states he does not eat salt and  he watches his sugar intake. Eats baked foods. 02-16-2020: The patient states he is compliant with heart healthy/ADA diet. Will continue to monitor.  Nash Dimmer with appropriate clinical care team members regarding patient needs.  The patient is currently working with the pharmacist and has an upcoming appointment with the LCSW. 02-16-2020- declined services of LCSW. Continues to work with [employer]. Will collaborate as needed with the CCM pharmacist.  . Evaluation of upcoming appointments:  Will follow up with the pcp on 02-26-2020.  Has appointment with the specialist for pain after Christmas.   Patient Self Care Activities related to Atrial Fibrillation, CHF, CAD, HTN, HLD, and ESRD:  . Patient is unable to independently self-manage chronic health conditions  Please see past updates related to this goal by clicking on the "Past  Updates" button in the selected goal         There are no care plans to display for this patient.   Plan:   Telephone follow up appointment with care management team member scheduled for: 04-12-2020 at 11:45 am   Noreene Larsson RN, MSN, [facility] Mobile: [phone]

## 2020-02-26 ENCOUNTER — Other Ambulatory Visit: Payer: Self-pay

## 2020-02-26 ENCOUNTER — Encounter: Payer: Self-pay | Admitting: Nurse Practitioner

## 2020-02-26 ENCOUNTER — Ambulatory Visit (INDEPENDENT_AMBULATORY_CARE_PROVIDER_SITE_OTHER): Payer: Medicare HMO | Admitting: Nurse Practitioner

## 2020-02-26 DIAGNOSIS — L97909 Non-pressure chronic ulcer of unspecified part of unspecified lower leg with unspecified severity: Secondary | ICD-10-CM

## 2020-02-26 MED ORDER — HYDROCODONE-ACETAMINOPHEN 5-325 MG PO TABS: 1.0000 | ORAL_TABLET | Freq: Four times a day (QID) | ORAL | 0 refills | Status: AC | PRN

## 2020-02-26 NOTE — Assessment & Plan Note (Signed)
BMI 42.80 with T2DM, ESRD, HF.  Recommended eating smaller high protein, low fat meals more frequently and exercising 30 mins a day 5 times a week with a goal of 10-15lb weight loss in the next 3 months. Patient voiced their understanding and motivation to adhere to these recommendations.

## 2020-02-26 NOTE — Progress Notes (Signed)
BP (!) 147/80   Pulse 87   Temp 98.5 F (36.9 C) (Oral)   Ht 5' 10.67" (1.795 m)   Wt (!) 304 lb (137.9 kg)   SpO2 98%   BMI 42.80 kg/m    Subjective:    [name], male    DOB: 1960/08/15, 59 y.o.   MRN: [id number]  HPI: Adam Howell is a 59 y.o. male  Chief Complaint  Patient presents with  . Ulcers on leg    B/L leg, have been clear up with use of Triamcinolone cream   SKIN LESION Follow-up for wounds on bilateral legs.  Reports they have been present for weeks to bilateral lower legs and they cause discomfort at times.  One on back of left leg and one right leg, they are painful.  At this time reports areas improving with Triamcinolone.  Is scheduled to see wound care at [facility] and vascular 12/22 and 1/31.    He reports taking Norco as needed in past, has been on long term, for post dialysis discomfort.He requests refills today, discussed office policy with him. Reviewed PDMP last fill Ambien 08/27/19 for 30 tablets and Norco 07/11/19 for 60 tablets by Dr. Rhea Bleacher [place].  No recent refills here in [place].  Discussed with him will send in short supply Norco, he needs to use sparingly, but if continued need will place referral to pain management for chronic pain needs. Duration: weeks Location:  Right and left calves Painful: irritation Itching: sometimes Onset: sudden Context: improving at this time Associated signs and symptoms: none History of skin cancer: no History of precancerous skin lesions: no Family history of skin cancer: no  Relevant past medical, surgical, family and social history reviewed and updated as indicated. Interim medical history since our last visit reviewed. Allergies and medications reviewed and updated.  Review of Systems  Constitutional: Negative for activity change, diaphoresis, fatigue and fever.  Respiratory: Negative for cough, chest tightness, shortness of breath and wheezing.   Cardiovascular: Negative  for chest pain, palpitations and leg swelling.  Gastrointestinal: Negative.   Endocrine: Negative for polydipsia, polyphagia and polyuria.  Skin: Positive for wound.  Neurological: Negative.   Psychiatric/Behavioral: Negative.     Per HPI unless specifically indicated above     Objective:    BP (!) 147/80   Pulse 87   Temp 98.5 F (36.9 C) (Oral)   Ht 5' 10.67" (1.795 m)   Wt (!) 304 lb (137.9 kg)   SpO2 98%   BMI 42.80 kg/m   Wt Readings from Last 3 Encounters:  02/26/20 (!) 304 lb (137.9 kg)  02/02/20 293 lb 3.2 oz (133 kg)  01/22/20 292 lb (132.5 kg)    Physical Exam Vitals and nursing note reviewed.  Constitutional:      General: He is awake. He is not in acute distress.    Appearance: He is well-developed and well-groomed. He is morbidly obese. He is not ill-appearing.  HENT:     Head: Normocephalic and atraumatic.     Right Ear: Hearing normal. No drainage.     Left Ear: Hearing normal. No drainage.  Eyes:     General: Lids are normal.        Right eye: No discharge.        Left eye: No discharge.     Conjunctiva/sclera: Conjunctivae normal.     Pupils: Pupils are equal, round, and reactive to light.  Neck:     Thyroid: No  thyromegaly.     Vascular: No carotid bruit.     Trachea: Trachea normal.  Cardiovascular:     Rate and Rhythm: Normal rate and regular rhythm.     Pulses:          Dorsalis pedis pulses are 1+ on the right side and 1+ on the left side.       Posterior tibial pulses are 1+ on the right side and 1+ on the left side.     Heart sounds: Normal heart sounds, S1 normal and S2 normal. No murmur heard. No gallop.      Arteriovenous access: left arteriovenous access is present. Pulmonary:     Effort: Pulmonary effort is normal. No accessory muscle usage or respiratory distress.     Breath sounds: Normal breath sounds.  Abdominal:     General: Bowel sounds are normal.     Palpations: Abdomen is soft. There is no hepatomegaly or splenomegaly.   Musculoskeletal:        General: Normal range of motion.     Cervical back: Normal range of motion and neck supple.     Right lower leg: No edema.     Left lower leg: No edema.  Feet:     Right foot:     Protective Sensation: 7 sites tested. 10 sites sensed.     Skin integrity: Dry skin present.     Toenail Condition: Right toenails are abnormally thick.     Left foot:     Protective Sensation: 6 sites tested. 10 sites sensed.     Skin integrity: Dry skin present.     Toenail Condition: Left toenails are abnormally thick.  Skin:    General: Skin is warm and dry.     Capillary Refill: Capillary refill takes less than 2 seconds.     Findings: No rash.     Comments: Two round, raised skin lesions noted, x 1 to posterior left calf and x 1 to lateral right calf.  Both 1 cm with dried, raised crusting area to interior and scaling around exterior at this time.  Bilateral lower extremities with xerosis and pulses diminished.  Areas have improved some on exam today.  Neurological:     Mental Status: [name] and oriented to person, place, and time.     Deep Tendon Reflexes: Reflexes are normal and symmetric.  Psychiatric:        Attention and Perception: Attention normal.        Mood and Affect: Mood normal.        Speech: Speech normal.        Behavior: Behavior normal. Behavior is cooperative.        Thought Content: Thought content normal.        Judgment: Judgment normal.     Results for orders placed or performed in visit on 01/22/20  Bayer DCA Hb A1c Waived  Result Value Ref Range   HB A1C (BAYER DCA - WAIVED) 6.7 <7.0 %  Lipid Panel w/o Chol/HDL Ratio  Result Value Ref Range   Cholesterol, Total CANCELED mg/dL   Triglycerides CANCELED    HDL CANCELED    VLDL Cholesterol Cal CANCELED mg/dL  TSH  Result Value Ref Range   TSH CANCELED uIU/mL  Comprehensive metabolic panel  Result Value Ref Range   Glucose CANCELED mg/dL   BUN CANCELED    Creatinine, Ser CANCELED     Sodium CANCELED    Potassium CANCELED    Chloride CANCELED  CO2 CANCELED    Calcium CANCELED    Total Protein CANCELED    Albumin CANCELED    Bilirubin Total CANCELED    Alkaline Phosphatase CANCELED    AST CANCELED    ALT CANCELED   CBC with Differential/Platelet  Result Value Ref Range   WBC 6.2 3.4 - 10.8 x10E3/uL   RBC 4.16 4.14 - 5.80 x10E6/uL   Hemoglobin 11.2 (L) 13.0 - 17.7 g/dL   Hematocrit 35.4 (L) 37.5 - 51.0 %   MCV 85 79 - 97 fL   MCH 26.9 26.6 - 33.0 pg   MCHC 31.6 31.5 - 35.7 g/dL   RDW 16.8 (H) 11.6 - 15.4 %   Platelets 250 150 - 450 x10E3/uL   Neutrophils 58 Not Estab. %   Lymphs 24 Not Estab. %   Monocytes 11 Not Estab. %   Eos 5 Not Estab. %   Basos 1 Not Estab. %   Neutrophils Absolute 3.7 1.4 - 7.0 x10E3/uL   Lymphocytes Absolute 1.5 0.7 - 3.1 x10E3/uL   Monocytes Absolute 0.7 0.1 - 0.9 x10E3/uL   EOS (ABSOLUTE) 0.3 0.0 - 0.4 x10E3/uL   Basophils Absolute 0.1 0.0 - 0.2 x10E3/uL   Immature Granulocytes 1 Not Estab. %   Immature Grans (Abs) 0.0 0.0 - 0.1 x10E3/uL      Assessment & Plan:   Problem List Items Addressed This Visit      Cardiovascular and Mediastinum   Vasculitic ulcer of lower extremity (HCC)    To bilateral lower calves, suspect ulceration due to poor circulation with his diabetes.  Some improvement this visit.  Will send in short course of Norco for pain as needed, has taken in past without issue, and Lidocaine cream continue.  At this time advised him not to touch areas, as crusting is present.  May require some debridement to further assess.  Recommend he attend upcoming [facility] wound care visits to further assess + vascular with [facility].  Return in 6 weeks, sooner if worsening.        Other   Morbid obesity ([place])    BMI 42.80 with T2DM, ESRD, HF.  Recommended eating smaller high protein, low fat meals more frequently and exercising 30 mins a day 5 times a week with a goal of 10-15lb weight loss in the next 3 months. Patient voiced their  understanding and motivation to adhere to these recommendations.           Follow up plan: Return in about 6 weeks (around 04/08/2020) for Wound check.

## 2020-02-26 NOTE — Patient Instructions (Signed)
Dr. Haynes Kerns at [facility] on 03/06/2020 -- [phone] (Work)    Adam Howell, Adult Taking care of your wound properly can help to prevent pain, infection, and scarring. It can also help your wound to heal more quickly. How to care for your wound Wound care      Follow instructions from your health care provider about how to take care of your wound. Make sure you: ? Wash your hands with soap and water before you change the bandage (dressing). If soap and water are not available, use hand sanitizer. ? Change your dressing as told by your health care provider. ? Leave stitches (sutures), skin glue, or adhesive strips in place. These skin closures may need to stay in place for 2 weeks or longer. If adhesive strip edges start to loosen and curl up, you may trim the loose edges. Do not remove adhesive strips completely unless your health care provider tells you to do that.  Check your wound area every day for signs of infection. Check for: ? Redness, swelling, or pain. ? Fluid or blood. ? Warmth. ? Pus or a bad smell.  Ask your health care provider if you should clean the wound with mild soap and water. Doing this may include: ? Using a clean towel to pat the wound dry after cleaning it. Do not rub or scrub the wound. ? Applying a cream or ointment. Do this only as told by your health care provider. ? Covering the incision with a clean dressing.  Ask your health care provider when you can leave the wound uncovered.  Keep the dressing dry until your health care provider says it can be removed. Do not take baths, swim, use a hot tub, or do anything that would put the wound underwater until your health care provider approves. Ask your health care provider if you can take showers. You may only be allowed to take sponge baths. Medicines   If you were prescribed an antibiotic medicine, cream, or ointment, take or use the antibiotic as told by your health care provider. Do not stop taking  or using the antibiotic even if your condition improves.  Take over-the-counter and prescription medicines only as told by your health care provider. If you were prescribed pain medicine, take it 30 or more minutes before you do any wound care or as told by your health care provider. General instructions  Return to your normal activities as told by your health care provider. Ask your health care provider what activities are safe.  Do not scratch or pick at the wound.  Do not use any products that contain nicotine or tobacco, such as cigarettes and e-cigarettes. These may delay wound healing. If you need help quitting, ask your health care provider.  Keep all follow-up visits as told by your health care provider. This is important.  Eat a diet that includes protein, vitamin A, vitamin C, and other nutrient-rich foods to help the wound heal. ? Foods rich in protein include meat, dairy, beans, nuts, and other sources. ? Foods rich in vitamin A include carrots and dark green, leafy vegetables. ? Foods rich in vitamin C include citrus, tomatoes, and other fruits and vegetables. ? Nutrient-rich foods have protein, carbohydrates, fat, vitamins, or minerals. Eat a variety of healthy foods including vegetables, fruits, and whole grains. Contact a health care provider if:  You received a tetanus shot and you have swelling, severe pain, redness, or bleeding at the injection site.  Your pain is not  controlled with medicine.  You have redness, swelling, or pain around the wound.  You have fluid or blood coming from the wound.  Your wound feels warm to the touch.  You have pus or a bad smell coming from the wound.  You have a fever or chills.  You are nauseous or you vomit.  You are dizzy. Get help right away if:  You have a red streak going away from your wound.  The edges of the wound open up and separate.  Your wound is bleeding, and the bleeding does not stop with gentle  pressure.  You have a rash.  You faint.  You have trouble breathing. Summary  Always wash your hands with soap and water before changing your bandage (dressing).  To help with healing, eat foods that are rich in protein, vitamin A, vitamin C, and other nutrients.  Check your wound every day for signs of infection. Contact your health care provider if you suspect that your wound is infected. This information is not intended to replace advice given to you by your health care provider. Make sure you discuss any questions you have with your health care provider. Document Revised: 06/20/2018 Document Reviewed: 09/17/2015 Elsevier Patient Education  [place].

## 2020-02-26 NOTE — Assessment & Plan Note (Signed)
To bilateral lower calves, suspect ulceration due to poor circulation with his diabetes.  Some improvement this visit.  Will send in short course of Norco for pain as needed, has taken in past without issue, and Lidocaine cream continue.  At this time advised him not to touch areas, as crusting is present.  May require some debridement to further assess.  Recommend he attend upcoming [facility] wound care visits to further assess + vascular with [facility].  Return in 6 weeks, sooner if worsening.

## 2020-03-18 DIAGNOSIS — N186 End stage renal disease: Secondary | ICD-10-CM | POA: Diagnosis not present

## 2020-03-18 DIAGNOSIS — D689 Coagulation defect, unspecified: Secondary | ICD-10-CM | POA: Diagnosis not present

## 2020-03-18 DIAGNOSIS — N2581 Secondary hyperparathyroidism of renal origin: Secondary | ICD-10-CM | POA: Diagnosis not present

## 2020-03-18 DIAGNOSIS — E8770 Fluid overload, unspecified: Secondary | ICD-10-CM | POA: Diagnosis not present

## 2020-03-18 DIAGNOSIS — R52 Pain, unspecified: Secondary | ICD-10-CM | POA: Diagnosis not present

## 2020-03-18 DIAGNOSIS — D509 Iron deficiency anemia, unspecified: Secondary | ICD-10-CM | POA: Diagnosis not present

## 2020-03-18 DIAGNOSIS — Z992 Dependence on renal dialysis: Secondary | ICD-10-CM | POA: Diagnosis not present

## 2020-03-19 DIAGNOSIS — D509 Iron deficiency anemia, unspecified: Secondary | ICD-10-CM | POA: Diagnosis not present

## 2020-03-19 DIAGNOSIS — Z992 Dependence on renal dialysis: Secondary | ICD-10-CM | POA: Diagnosis not present

## 2020-03-19 DIAGNOSIS — N186 End stage renal disease: Secondary | ICD-10-CM | POA: Diagnosis not present

## 2020-03-19 DIAGNOSIS — R52 Pain, unspecified: Secondary | ICD-10-CM | POA: Diagnosis not present

## 2020-03-19 DIAGNOSIS — D689 Coagulation defect, unspecified: Secondary | ICD-10-CM | POA: Diagnosis not present

## 2020-03-19 DIAGNOSIS — E8770 Fluid overload, unspecified: Secondary | ICD-10-CM | POA: Diagnosis not present

## 2020-03-19 DIAGNOSIS — N2581 Secondary hyperparathyroidism of renal origin: Secondary | ICD-10-CM | POA: Diagnosis not present

## 2020-03-20 DIAGNOSIS — R52 Pain, unspecified: Secondary | ICD-10-CM | POA: Diagnosis not present

## 2020-03-20 DIAGNOSIS — N2581 Secondary hyperparathyroidism of renal origin: Secondary | ICD-10-CM | POA: Diagnosis not present

## 2020-03-20 DIAGNOSIS — E8770 Fluid overload, unspecified: Secondary | ICD-10-CM | POA: Diagnosis not present

## 2020-03-20 DIAGNOSIS — D509 Iron deficiency anemia, unspecified: Secondary | ICD-10-CM | POA: Diagnosis not present

## 2020-03-20 DIAGNOSIS — N186 End stage renal disease: Secondary | ICD-10-CM | POA: Diagnosis not present

## 2020-03-20 DIAGNOSIS — Z992 Dependence on renal dialysis: Secondary | ICD-10-CM | POA: Diagnosis not present

## 2020-03-20 DIAGNOSIS — D689 Coagulation defect, unspecified: Secondary | ICD-10-CM | POA: Diagnosis not present

## 2020-03-21 DIAGNOSIS — D509 Iron deficiency anemia, unspecified: Secondary | ICD-10-CM | POA: Diagnosis not present

## 2020-03-21 DIAGNOSIS — Z992 Dependence on renal dialysis: Secondary | ICD-10-CM | POA: Diagnosis not present

## 2020-03-21 DIAGNOSIS — R52 Pain, unspecified: Secondary | ICD-10-CM | POA: Diagnosis not present

## 2020-03-21 DIAGNOSIS — N2581 Secondary hyperparathyroidism of renal origin: Secondary | ICD-10-CM | POA: Diagnosis not present

## 2020-03-21 DIAGNOSIS — D689 Coagulation defect, unspecified: Secondary | ICD-10-CM | POA: Diagnosis not present

## 2020-03-21 DIAGNOSIS — E8770 Fluid overload, unspecified: Secondary | ICD-10-CM | POA: Diagnosis not present

## 2020-03-21 DIAGNOSIS — N186 End stage renal disease: Secondary | ICD-10-CM | POA: Diagnosis not present

## 2020-03-23 DIAGNOSIS — D689 Coagulation defect, unspecified: Secondary | ICD-10-CM | POA: Diagnosis not present

## 2020-03-23 DIAGNOSIS — D509 Iron deficiency anemia, unspecified: Secondary | ICD-10-CM | POA: Diagnosis not present

## 2020-03-23 DIAGNOSIS — R52 Pain, unspecified: Secondary | ICD-10-CM | POA: Diagnosis not present

## 2020-03-23 DIAGNOSIS — E8770 Fluid overload, unspecified: Secondary | ICD-10-CM | POA: Diagnosis not present

## 2020-03-23 DIAGNOSIS — Z992 Dependence on renal dialysis: Secondary | ICD-10-CM | POA: Diagnosis not present

## 2020-03-23 DIAGNOSIS — N186 End stage renal disease: Secondary | ICD-10-CM | POA: Diagnosis not present

## 2020-03-23 DIAGNOSIS — N2581 Secondary hyperparathyroidism of renal origin: Secondary | ICD-10-CM | POA: Diagnosis not present

## 2020-03-26 DIAGNOSIS — N2581 Secondary hyperparathyroidism of renal origin: Secondary | ICD-10-CM | POA: Diagnosis not present

## 2020-03-26 DIAGNOSIS — D509 Iron deficiency anemia, unspecified: Secondary | ICD-10-CM | POA: Diagnosis not present

## 2020-03-26 DIAGNOSIS — N186 End stage renal disease: Secondary | ICD-10-CM | POA: Diagnosis not present

## 2020-03-26 DIAGNOSIS — D689 Coagulation defect, unspecified: Secondary | ICD-10-CM | POA: Diagnosis not present

## 2020-03-26 DIAGNOSIS — E8770 Fluid overload, unspecified: Secondary | ICD-10-CM | POA: Diagnosis not present

## 2020-03-26 DIAGNOSIS — Z992 Dependence on renal dialysis: Secondary | ICD-10-CM | POA: Diagnosis not present

## 2020-03-26 DIAGNOSIS — R52 Pain, unspecified: Secondary | ICD-10-CM | POA: Diagnosis not present

## 2020-03-27 DIAGNOSIS — Z992 Dependence on renal dialysis: Secondary | ICD-10-CM | POA: Diagnosis not present

## 2020-03-27 DIAGNOSIS — N186 End stage renal disease: Secondary | ICD-10-CM | POA: Diagnosis not present

## 2020-03-27 DIAGNOSIS — N2581 Secondary hyperparathyroidism of renal origin: Secondary | ICD-10-CM | POA: Diagnosis not present

## 2020-03-27 DIAGNOSIS — E8770 Fluid overload, unspecified: Secondary | ICD-10-CM | POA: Diagnosis not present

## 2020-03-28 DIAGNOSIS — D689 Coagulation defect, unspecified: Secondary | ICD-10-CM | POA: Diagnosis not present

## 2020-03-28 DIAGNOSIS — E8770 Fluid overload, unspecified: Secondary | ICD-10-CM | POA: Diagnosis not present

## 2020-03-28 DIAGNOSIS — N186 End stage renal disease: Secondary | ICD-10-CM | POA: Diagnosis not present

## 2020-03-28 DIAGNOSIS — R52 Pain, unspecified: Secondary | ICD-10-CM | POA: Diagnosis not present

## 2020-03-28 DIAGNOSIS — N2581 Secondary hyperparathyroidism of renal origin: Secondary | ICD-10-CM | POA: Diagnosis not present

## 2020-03-28 DIAGNOSIS — D509 Iron deficiency anemia, unspecified: Secondary | ICD-10-CM | POA: Diagnosis not present

## 2020-03-28 DIAGNOSIS — Z992 Dependence on renal dialysis: Secondary | ICD-10-CM | POA: Diagnosis not present

## 2020-03-30 DIAGNOSIS — D509 Iron deficiency anemia, unspecified: Secondary | ICD-10-CM | POA: Diagnosis not present

## 2020-03-30 DIAGNOSIS — N2581 Secondary hyperparathyroidism of renal origin: Secondary | ICD-10-CM | POA: Diagnosis not present

## 2020-03-30 DIAGNOSIS — Z992 Dependence on renal dialysis: Secondary | ICD-10-CM | POA: Diagnosis not present

## 2020-03-30 DIAGNOSIS — D689 Coagulation defect, unspecified: Secondary | ICD-10-CM | POA: Diagnosis not present

## 2020-03-30 DIAGNOSIS — E8770 Fluid overload, unspecified: Secondary | ICD-10-CM | POA: Diagnosis not present

## 2020-03-30 DIAGNOSIS — N186 End stage renal disease: Secondary | ICD-10-CM | POA: Diagnosis not present

## 2020-03-30 DIAGNOSIS — R52 Pain, unspecified: Secondary | ICD-10-CM | POA: Diagnosis not present

## 2020-04-01 ENCOUNTER — Ambulatory Visit: Payer: Medicare Other | Admitting: Podiatry

## 2020-04-02 DIAGNOSIS — D509 Iron deficiency anemia, unspecified: Secondary | ICD-10-CM | POA: Diagnosis not present

## 2020-04-02 DIAGNOSIS — N186 End stage renal disease: Secondary | ICD-10-CM | POA: Diagnosis not present

## 2020-04-02 DIAGNOSIS — Z992 Dependence on renal dialysis: Secondary | ICD-10-CM | POA: Diagnosis not present

## 2020-04-02 DIAGNOSIS — R52 Pain, unspecified: Secondary | ICD-10-CM | POA: Diagnosis not present

## 2020-04-02 DIAGNOSIS — D689 Coagulation defect, unspecified: Secondary | ICD-10-CM | POA: Diagnosis not present

## 2020-04-02 DIAGNOSIS — N2581 Secondary hyperparathyroidism of renal origin: Secondary | ICD-10-CM | POA: Diagnosis not present

## 2020-04-02 DIAGNOSIS — E8770 Fluid overload, unspecified: Secondary | ICD-10-CM | POA: Diagnosis not present

## 2020-04-03 DIAGNOSIS — Z992 Dependence on renal dialysis: Secondary | ICD-10-CM | POA: Diagnosis not present

## 2020-04-03 DIAGNOSIS — N186 End stage renal disease: Secondary | ICD-10-CM | POA: Diagnosis not present

## 2020-04-03 DIAGNOSIS — E8770 Fluid overload, unspecified: Secondary | ICD-10-CM | POA: Diagnosis not present

## 2020-04-03 DIAGNOSIS — N2581 Secondary hyperparathyroidism of renal origin: Secondary | ICD-10-CM | POA: Diagnosis not present

## 2020-04-04 DIAGNOSIS — D509 Iron deficiency anemia, unspecified: Secondary | ICD-10-CM | POA: Diagnosis not present

## 2020-04-04 DIAGNOSIS — R52 Pain, unspecified: Secondary | ICD-10-CM | POA: Diagnosis not present

## 2020-04-04 DIAGNOSIS — N2581 Secondary hyperparathyroidism of renal origin: Secondary | ICD-10-CM | POA: Diagnosis not present

## 2020-04-04 DIAGNOSIS — Z992 Dependence on renal dialysis: Secondary | ICD-10-CM | POA: Diagnosis not present

## 2020-04-04 DIAGNOSIS — N186 End stage renal disease: Secondary | ICD-10-CM | POA: Diagnosis not present

## 2020-04-04 DIAGNOSIS — E8770 Fluid overload, unspecified: Secondary | ICD-10-CM | POA: Diagnosis not present

## 2020-04-04 DIAGNOSIS — D689 Coagulation defect, unspecified: Secondary | ICD-10-CM | POA: Diagnosis not present

## 2020-04-06 DIAGNOSIS — Z992 Dependence on renal dialysis: Secondary | ICD-10-CM | POA: Diagnosis not present

## 2020-04-06 DIAGNOSIS — R52 Pain, unspecified: Secondary | ICD-10-CM | POA: Diagnosis not present

## 2020-04-06 DIAGNOSIS — E8770 Fluid overload, unspecified: Secondary | ICD-10-CM | POA: Diagnosis not present

## 2020-04-06 DIAGNOSIS — N2581 Secondary hyperparathyroidism of renal origin: Secondary | ICD-10-CM | POA: Diagnosis not present

## 2020-04-06 DIAGNOSIS — D689 Coagulation defect, unspecified: Secondary | ICD-10-CM | POA: Diagnosis not present

## 2020-04-06 DIAGNOSIS — D509 Iron deficiency anemia, unspecified: Secondary | ICD-10-CM | POA: Diagnosis not present

## 2020-04-06 DIAGNOSIS — N186 End stage renal disease: Secondary | ICD-10-CM | POA: Diagnosis not present

## 2020-04-08 ENCOUNTER — Ambulatory Visit (INDEPENDENT_AMBULATORY_CARE_PROVIDER_SITE_OTHER): Payer: Medicare Other | Admitting: Nurse Practitioner

## 2020-04-08 ENCOUNTER — Other Ambulatory Visit: Payer: Self-pay

## 2020-04-08 ENCOUNTER — Ambulatory Visit: Payer: Medicare Other | Admitting: Podiatry

## 2020-04-08 ENCOUNTER — Encounter: Payer: Self-pay | Admitting: Nurse Practitioner

## 2020-04-08 VITALS — BP 138/86 | HR 92 | Temp 98.6°F | Ht 70.79 in | Wt 296.8 lb

## 2020-04-08 DIAGNOSIS — L97909 Non-pressure chronic ulcer of unspecified part of unspecified lower leg with unspecified severity: Secondary | ICD-10-CM | POA: Diagnosis not present

## 2020-04-08 MED ORDER — HYDROCODONE-ACETAMINOPHEN 5-325 MG PO TABS: 1.0000 | ORAL_TABLET | Freq: Four times a day (QID) | ORAL | 0 refills | Status: AC | PRN

## 2020-04-08 NOTE — Assessment & Plan Note (Signed)
To bilateral lower calves, suspect ulceration due to poor circulation with his diabetes.  Some improvement this time and remaining stable.  Will send in short course of Norco for pain as needed, has taken in past without issue, and Lidocaine cream continue.  At this time advised him not to touch areas, as crusting is present.  May require some debridement to further assess.  Recommend he attend upcoming [facility] wound care visits to further assess + will check on vascular referral with [facility], as has not heard from them.  Return in 2 months, sooner if worsening.

## 2020-04-08 NOTE — Progress Notes (Signed)
BP 138/86   Pulse 92   Temp 98.6 F (37 C) (Oral)   Ht 5' 10.79" (1.798 m)   Wt 296 lb 12.8 oz (134.6 kg)   SpO2 99%   BMI 41.64 kg/m    Subjective:    [name], male    DOB: 09/24/1960, 60 y.o.   MRN: [id number]  HPI: Adam Howell is a 60 y.o. male  Chief Complaint  Patient presents with  . Vasculitic Ulcer Lower Extremities    B/L Patient states that Ulcers are doing much better    SKIN LESION Follow-up for wounds on bilateral legs.  Reports they have been present for weeks to bilateral lower legs and they cause discomfort at times.  One on back of left leg and one right leg, they are painful at times.  At this time reports areas remain stable with Triamcinolone.  Is scheduled to see wound care at [facility] 04/15/20, had vascular referral but has not heard from them.    He reports taking Norco as needed in past, has been on long term in past for leg pain-- discussed office policy with him for long term use.  Reviewed PDMP last fill Ambien 08/27/19 for 30 tablets and Norco 07/11/19 for 60 tablets by Dr. Rhea Bleacher [place].  No recent refills here in [place].  Discussed with him will send in short supply Norco, he needs to use sparingly, but if continued need will place referral to pain management for chronic pain needs. Duration: weeks Location:  Right and left calves Painful: irritation Itching: sometimes Onset: sudden Context: improving at this time Associated signs and symptoms: none History of skin cancer: no History of precancerous skin lesions: no Family history of skin cancer: no  Relevant past medical, surgical, family and social history reviewed and updated as indicated. Interim medical history since our last visit reviewed. Allergies and medications reviewed and updated.  Review of Systems  Constitutional: Negative for activity change, diaphoresis, fatigue and fever.  Respiratory: Negative for cough, chest tightness, shortness of breath  and wheezing.   Cardiovascular: Negative for chest pain, palpitations and leg swelling.  Gastrointestinal: Negative.   Endocrine: Negative for polydipsia, polyphagia and polyuria.  Skin: Positive for wound.  Neurological: Negative.   Psychiatric/Behavioral: Negative.    Per HPI unless specifically indicated above     Objective:    BP 138/86   Pulse 92   Temp 98.6 F (37 C) (Oral)   Ht 5' 10.79" (1.798 m)   Wt 296 lb 12.8 oz (134.6 kg)   SpO2 99%   BMI 41.64 kg/m   Wt Readings from Last 3 Encounters:  04/08/20 296 lb 12.8 oz (134.6 kg)  02/26/20 (!) 304 lb (137.9 kg)  02/02/20 293 lb 3.2 oz (133 kg)    Physical Exam Vitals and nursing note reviewed.  Constitutional:      General: He is awake. He is not in acute distress.    Appearance: He is well-developed and well-groomed. He is morbidly obese. He is not ill-appearing.  HENT:     Head: Normocephalic and atraumatic.     Right Ear: Hearing normal. No drainage.     Left Ear: Hearing normal. No drainage.  Eyes:     General: Lids are normal.        Right eye: No discharge.        Left eye: No discharge.     Conjunctiva/sclera: Conjunctivae normal.     Pupils: Pupils are equal, round,  and reactive to light.  Neck:     Thyroid: No thyromegaly.     Vascular: No carotid bruit.     Trachea: Trachea normal.  Cardiovascular:     Rate and Rhythm: Normal rate and regular rhythm.     Pulses:          Dorsalis pedis pulses are 1+ on the right side and 1+ on the left side.       Posterior tibial pulses are 1+ on the right side and 1+ on the left side.     Heart sounds: Normal heart sounds, S1 normal and S2 normal. No murmur heard. No gallop.      Arteriovenous access: left arteriovenous access is present. Pulmonary:     Effort: Pulmonary effort is normal. No accessory muscle usage or respiratory distress.     Breath sounds: Normal breath sounds.  Abdominal:     General: Bowel sounds are normal.     Palpations: Abdomen is  soft. There is no hepatomegaly or splenomegaly.  Musculoskeletal:        General: Normal range of motion.     Cervical back: Normal range of motion and neck supple.     Right lower leg: No edema.     Left lower leg: No edema.  Feet:     Right foot:     Protective Sensation: 7 sites tested. 10 sites sensed.     Skin integrity: Dry skin present.     Toenail Condition: Right toenails are abnormally thick.     Left foot:     Protective Sensation: 6 sites tested. 10 sites sensed.     Skin integrity: Dry skin present.     Toenail Condition: Left toenails are abnormally thick.  Skin:    General: Skin is warm and dry.     Capillary Refill: Capillary refill takes less than 2 seconds.     Findings: No rash.     Comments: Two round, raised skin lesions noted, x 1 to posterior left calf and x 1 to lateral right calf.  Both 1 cm with dried, raised crusting area to interior and scaling around exterior at this time.  Bilateral lower extremities with xerosis and pulses diminished.  Areas remain stable on exam today.  Neurological:     Mental Status: [name] and oriented to person, place, and time.     Deep Tendon Reflexes: Reflexes are normal and symmetric.  Psychiatric:        Attention and Perception: Attention normal.        Mood and Affect: Mood normal.        Speech: Speech normal.        Behavior: Behavior normal. Behavior is cooperative.        Thought Content: Thought content normal.        Judgment: Judgment normal.     Results for orders placed or performed in visit on 01/22/20  Bayer DCA Hb A1c Waived  Result Value Ref Range   HB A1C (BAYER DCA - WAIVED) 6.7 <7.0 %  Lipid Panel w/o Chol/HDL Ratio  Result Value Ref Range   Cholesterol, Total CANCELED mg/dL   Triglycerides CANCELED    HDL CANCELED    VLDL Cholesterol Cal CANCELED mg/dL  TSH  Result Value Ref Range   TSH CANCELED uIU/mL  Comprehensive metabolic panel  Result Value Ref Range   Glucose CANCELED mg/dL   BUN  CANCELED    Creatinine, Ser CANCELED    Sodium CANCELED  Potassium CANCELED    Chloride CANCELED    CO2 CANCELED    Calcium CANCELED    Total Protein CANCELED    Albumin CANCELED    Bilirubin Total CANCELED    Alkaline Phosphatase CANCELED    AST CANCELED    ALT CANCELED   CBC with Differential/Platelet  Result Value Ref Range   WBC 6.2 3.4 - 10.8 x10E3/uL   RBC 4.16 4.14 - 5.80 x10E6/uL   Hemoglobin 11.2 (L) 13.0 - 17.7 g/dL   Hematocrit 35.4 (L) 37.5 - 51.0 %   MCV 85 79 - 97 fL   MCH 26.9 26.6 - 33.0 pg   MCHC 31.6 31.5 - 35.7 g/dL   RDW 16.8 (H) 11.6 - 15.4 %   Platelets 250 150 - 450 x10E3/uL   Neutrophils 58 Not Estab. %   Lymphs 24 Not Estab. %   Monocytes 11 Not Estab. %   Eos 5 Not Estab. %   Basos 1 Not Estab. %   Neutrophils Absolute 3.7 1.4 - 7.0 x10E3/uL   Lymphocytes Absolute 1.5 0.7 - 3.1 x10E3/uL   Monocytes Absolute 0.7 0.1 - 0.9 x10E3/uL   EOS (ABSOLUTE) 0.3 0.0 - 0.4 x10E3/uL   Basophils Absolute 0.1 0.0 - 0.2 x10E3/uL   Immature Granulocytes 1 Not Estab. %   Immature Grans (Abs) 0.0 0.0 - 0.1 x10E3/uL      Assessment & Plan:   Problem List Items Addressed This Visit      Cardiovascular and Mediastinum   Vasculitic ulcer of lower extremity ([place]) - Primary    To bilateral lower calves, suspect ulceration due to poor circulation with his diabetes.  Some improvement this time and remaining stable.  Will send in short course of Norco for pain as needed, has taken in past without issue, and Lidocaine cream continue.  At this time advised him not to touch areas, as crusting is present.  May require some debridement to further assess.  Recommend he attend upcoming [facility] wound care visits to further assess + will check on vascular referral with [facility], as has not heard from them.  Return in 2 months, sooner if worsening.          Follow up plan: Return in about 2 months (around 06/06/2020) for T2DM, HTN/HLD, PVD.

## 2020-04-08 NOTE — Patient Instructions (Signed)
Diabetes Mellitus and Nutrition, Adult When you have diabetes, or diabetes mellitus, it is very important to have healthy eating habits because your blood sugar (glucose) levels are greatly affected by what you eat and drink. Eating healthy foods in the right amounts, at about the same times every day, can help you:  Control your blood glucose.  Lower your risk of heart disease.  Improve your blood pressure.  Reach or maintain a healthy weight. What can affect my meal plan? Every person with diabetes is different, and each person has different needs for a meal plan. Your health care provider may recommend that you work with a dietitian to make a meal plan that is best for you. Your meal plan may vary depending on factors such as:  The calories you need.  The medicines you take.  Your weight.  Your blood glucose, blood pressure, and cholesterol levels.  Your activity level.  Other health conditions you have, such as heart or kidney disease. How do carbohydrates affect me? Carbohydrates, also called carbs, affect your blood glucose level more than any other type of food. Eating carbs naturally raises the amount of glucose in your blood. Carb counting is a method for keeping track of how many carbs you eat. Counting carbs is important to keep your blood glucose at a healthy level, especially if you use insulin or take certain oral diabetes medicines. It is important to know how many carbs you can safely have in each meal. This is different for every person. Your dietitian can help you calculate how many carbs you should have at each meal and for each snack. How does alcohol affect me? Alcohol can cause a sudden decrease in blood glucose (hypoglycemia), especially if you use insulin or take certain oral diabetes medicines. Hypoglycemia can be a life-threatening condition. Symptoms of hypoglycemia, such as sleepiness, dizziness, and confusion, are similar to symptoms of having too much  alcohol.  Do not drink alcohol if: ? Your health care provider tells you not to drink. ? You are pregnant, may be pregnant, or are planning to become pregnant.  If you drink alcohol: ? Do not drink on an empty stomach. ? Limit how much you use to:  0-1 drink a day for women.  0-2 drinks a day for men. ? Be aware of how much alcohol is in your drink. In the [place] one drink equals one 12 oz bottle of beer (355 mL), one 5 oz glass of wine (148 mL), or one 1 oz glass of hard liquor (44 mL). ? Keep yourself hydrated with water, diet soda, or unsweetened iced tea.  Keep in mind that regular soda, juice, and other mixers may contain a lot of sugar and must be counted as carbs. What are tips for following this plan? Reading food labels  Start by checking the serving size on the "Nutrition Facts" label of packaged foods and drinks. The amount of calories, carbs, fats, and other nutrients listed on the label is based on one serving of the item. Many items contain more than one serving per package.  Check the total grams (g) of carbs in one serving. You can calculate the number of servings of carbs in one serving by dividing the total carbs by 15. For example, if a food has 30 g of total carbs per serving, it would be equal to 2 servings of carbs.  Check the number of grams (g) of saturated fats and trans fats in one serving. Choose foods that have   a low amount or none of these fats.  Check the number of milligrams (mg) of salt (sodium) in one serving. Most people should limit total sodium intake to less than 2,300 mg per day.  Always check the nutrition information of foods labeled as "low-fat" or "nonfat." These foods may be higher in added sugar or refined carbs and should be avoided.  Talk to your dietitian to identify your daily goals for nutrients listed on the label. Shopping  Avoid buying canned, pre-made, or processed foods. These foods tend to be high in fat, sodium, and added  sugar.  Shop around the outside edge of the grocery store. This is where you will most often find fresh fruits and vegetables, bulk grains, fresh meats, and fresh dairy. Cooking  Use low-heat cooking methods, such as baking, instead of high-heat cooking methods like deep frying.  Cook using healthy oils, such as olive, canola, or sunflower oil.  Avoid cooking with butter, cream, or high-fat meats. Meal planning  Eat meals and snacks regularly, preferably at the same times every day. Avoid going long periods of time without eating.  Eat foods that are high in fiber, such as fresh fruits, vegetables, beans, and whole grains. Talk with your dietitian about how many servings of carbs you can eat at each meal.  Eat 4-6 oz (112-168 g) of lean protein each day, such as lean meat, chicken, fish, eggs, or tofu. One ounce (oz) of lean protein is equal to: ? 1 oz (28 g) of meat, chicken, or fish. ? 1 egg. ?  cup (62 g) of tofu.  Eat some foods each day that contain healthy fats, such as avocado, nuts, seeds, and fish.   What foods should I eat? Fruits Berries. Apples. Oranges. Peaches. Apricots. Plums. Grapes. Mango. Papaya. Pomegranate. Kiwi. Cherries. Vegetables Lettuce. Spinach. Leafy greens, including kale, chard, collard greens, and mustard greens. Beets. Cauliflower. Cabbage. Broccoli. Carrots. Green beans. Tomatoes. Peppers. Onions. Cucumbers. Brussels sprouts. Grains Whole grains, such as whole-wheat or whole-grain bread, crackers, tortillas, cereal, and pasta. Unsweetened oatmeal. Quinoa. Weiler or wild rice. Meats and other proteins Seafood. Poultry without skin. Lean cuts of poultry and beef. Tofu. Nuts. Seeds. Dairy Low-fat or fat-free dairy products such as milk, yogurt, and cheese. The items listed above may not be a complete list of foods and beverages you can eat. Contact a dietitian for more information. What foods should I avoid? Fruits Fruits canned with  syrup. Vegetables Canned vegetables. Frozen vegetables with butter or cream sauce. Grains Refined white flour and flour products such as bread, pasta, snack foods, and cereals. Avoid all processed foods. Meats and other proteins Fatty cuts of meat. Poultry with skin. Breaded or fried meats. Processed meat. Avoid saturated fats. Dairy Full-fat yogurt, cheese, or milk. Beverages Sweetened drinks, such as soda or iced tea. The items listed above may not be a complete list of foods and beverages you should avoid. Contact a dietitian for more information. Questions to ask a health care provider  Do I need to meet with a diabetes educator?  Do I need to meet with a dietitian?  What number can I call if I have questions?  When are the best times to check my blood glucose? Where to find more information:  American Diabetes Association: diabetes.org  Academy of Nutrition and Dietetics: www.eatright.org  National Institute of Diabetes and Digestive and Kidney Diseases: www.niddk.nih.gov  Association of Diabetes Care and Education Specialists: www.diabeteseducator.org Summary  It is important to have healthy eating   habits because your blood sugar (glucose) levels are greatly affected by what you eat and drink.  A healthy meal plan will help you control your blood glucose and maintain a healthy lifestyle.  Your health care provider may recommend that you work with a dietitian to make a meal plan that is best for you.  Keep in mind that carbohydrates (carbs) and alcohol have immediate effects on your blood glucose levels. It is important to count carbs and to use alcohol carefully. This information is not intended to replace advice given to you by your health care provider. Make sure you discuss any questions you have with your health care provider. Document Revised: 02/07/2019 Document Reviewed: 02/07/2019 Elsevier Patient Education  2021 Elsevier Inc.  

## 2020-04-09 DIAGNOSIS — R52 Pain, unspecified: Secondary | ICD-10-CM | POA: Diagnosis not present

## 2020-04-09 DIAGNOSIS — N186 End stage renal disease: Secondary | ICD-10-CM | POA: Diagnosis not present

## 2020-04-09 DIAGNOSIS — D689 Coagulation defect, unspecified: Secondary | ICD-10-CM | POA: Diagnosis not present

## 2020-04-09 DIAGNOSIS — Z992 Dependence on renal dialysis: Secondary | ICD-10-CM | POA: Diagnosis not present

## 2020-04-09 DIAGNOSIS — D509 Iron deficiency anemia, unspecified: Secondary | ICD-10-CM | POA: Diagnosis not present

## 2020-04-09 DIAGNOSIS — N2581 Secondary hyperparathyroidism of renal origin: Secondary | ICD-10-CM | POA: Diagnosis not present

## 2020-04-09 DIAGNOSIS — E8770 Fluid overload, unspecified: Secondary | ICD-10-CM | POA: Diagnosis not present

## 2020-04-10 ENCOUNTER — Telehealth: Payer: Self-pay

## 2020-04-10 DIAGNOSIS — N2581 Secondary hyperparathyroidism of renal origin: Secondary | ICD-10-CM | POA: Diagnosis not present

## 2020-04-10 DIAGNOSIS — N186 End stage renal disease: Secondary | ICD-10-CM | POA: Diagnosis not present

## 2020-04-10 DIAGNOSIS — Z992 Dependence on renal dialysis: Secondary | ICD-10-CM | POA: Diagnosis not present

## 2020-04-10 DIAGNOSIS — E8770 Fluid overload, unspecified: Secondary | ICD-10-CM | POA: Diagnosis not present

## 2020-04-10 NOTE — Telephone Encounter (Signed)
Copied from [place] [id number]. Topic: General - Other >> Apr 10, 2020  1:05 PM Celene Kras wrote: Reason for CRM: Pt called stating that he is supposed to see a specialist on 04/15/20. He states that he is needing to have PCP call transportation so that he can receive a ride to his appt. Please advise.

## 2020-04-11 DIAGNOSIS — D509 Iron deficiency anemia, unspecified: Secondary | ICD-10-CM | POA: Diagnosis not present

## 2020-04-11 DIAGNOSIS — N186 End stage renal disease: Secondary | ICD-10-CM | POA: Diagnosis not present

## 2020-04-11 DIAGNOSIS — E8770 Fluid overload, unspecified: Secondary | ICD-10-CM | POA: Diagnosis not present

## 2020-04-11 DIAGNOSIS — Z992 Dependence on renal dialysis: Secondary | ICD-10-CM | POA: Diagnosis not present

## 2020-04-11 DIAGNOSIS — D689 Coagulation defect, unspecified: Secondary | ICD-10-CM | POA: Diagnosis not present

## 2020-04-11 DIAGNOSIS — N2581 Secondary hyperparathyroidism of renal origin: Secondary | ICD-10-CM | POA: Diagnosis not present

## 2020-04-11 DIAGNOSIS — R52 Pain, unspecified: Secondary | ICD-10-CM | POA: Diagnosis not present

## 2020-04-12 ENCOUNTER — Ambulatory Visit: Payer: Self-pay | Admitting: General Practice

## 2020-04-12 ENCOUNTER — Telehealth: Payer: Self-pay | Admitting: Nurse Practitioner

## 2020-04-12 ENCOUNTER — Telehealth: Payer: Self-pay

## 2020-04-12 ENCOUNTER — Other Ambulatory Visit: Payer: Self-pay | Admitting: Nurse Practitioner

## 2020-04-12 ENCOUNTER — Telehealth: Payer: Self-pay | Admitting: General Practice

## 2020-04-12 DIAGNOSIS — E1122 Type 2 diabetes mellitus with diabetic chronic kidney disease: Secondary | ICD-10-CM

## 2020-04-12 DIAGNOSIS — I5020 Unspecified systolic (congestive) heart failure: Secondary | ICD-10-CM

## 2020-04-12 DIAGNOSIS — G894 Chronic pain syndrome: Secondary | ICD-10-CM

## 2020-04-12 DIAGNOSIS — I110 Hypertensive heart disease with heart failure: Secondary | ICD-10-CM

## 2020-04-12 DIAGNOSIS — N186 End stage renal disease: Secondary | ICD-10-CM

## 2020-04-12 DIAGNOSIS — M79605 Pain in left leg: Secondary | ICD-10-CM

## 2020-04-12 DIAGNOSIS — L97909 Non-pressure chronic ulcer of unspecified part of unspecified lower leg with unspecified severity: Secondary | ICD-10-CM

## 2020-04-12 DIAGNOSIS — M79604 Pain in right leg: Secondary | ICD-10-CM

## 2020-04-12 NOTE — Telephone Encounter (Signed)
   Telephone encounter was:  Successful.  04/12/2020 Name: Torin Diebold MRN: [id number] DOB: 02-Oct-1960  Natanael Berretta is a 60 y.o. year old male who is a primary care patient of Cannady, Barbaraann Faster, NP . The community resource team was consulted for assistance with Transportation Needs   Care guide performed the following interventions: Received message via phone encounter regarding transportation assistance for paitent. No Community Resource Referral created for patient. (one-time approval from Stormy Fabian to assist patient with no referral). Patient had an appointment in [facility]. Mr. Eischeid had already spoken with [employer], but was unable to receive assistance because their organization needs a three day notice. Care Guide spoke with [facility] and spoke with Director of  Care Management Services to get ride approved for Monday, April 15, 2020. Mr. Tlatelpa stated that he rescheduled the ride to August 2022.   .  Follow Up Plan:  No further follow up planned at this time. The patient has been provided with needed resources.  [place], Care Management Phone: [phone] Email: [email or web address]

## 2020-04-12 NOTE — Telephone Encounter (Signed)
I sent a new referral for this, although he is scheduled 04/15/20 with [facility] wound care.  Unsure about this, should have received referral form if scheduled.  Janett Billow making you aware here.

## 2020-04-12 NOTE — Telephone Encounter (Signed)
Called pt advised of Jolene's message and gave phone number to [facility] wound care

## 2020-04-12 NOTE — Telephone Encounter (Signed)
Copied from [place] [phone]. Topic: Referral - Status >> Apr 12, 2020 11:33 AM Pawlus, Brayton Layman A wrote: Reason for CRM: PT called in regarding a wound care referral, states the specialist did not receive any referral. Please re-fax referral and contact PT to confirm status. Follow up with PT when completed.

## 2020-04-12 NOTE — Telephone Encounter (Signed)
Resent referral to [facility] wound just to make sure they have it.

## 2020-04-12 NOTE — Chronic Care Management (AMB) (Signed)
Chronic Care Management   CCM RN Visit Note  04/12/2020 Name: Adam Howell MRN: [id number] DOB: 02/04/1961  Subjective: Adam Howell is a 60 y.o. year old male who is a primary care patient of Cannady, Barbaraann Faster, NP. The care management team was consulted for assistance with disease management and care coordination needs.    Engaged with patient by telephone for follow up visit in response to provider referral for case management and/or care coordination services.   Consent to Services:  The patient was given information about Chronic Care Management services, agreed to services, and gave verbal consent prior to initiation of services.  Please see initial visit note for detailed documentation.   Patient agreed to services and verbal consent obtained.   Assessment: Review of patient past medical history, allergies, medications, health status, including review of consultants reports, laboratory and other test data, was performed as part of comprehensive evaluation and provision of chronic care management services.   SDOH (Social Determinants of Health) assessments and interventions performed:    CCM Care Plan  Allergies  Allergen Reactions   Tramadol Rash    Outpatient Encounter Medications as of 04/12/2020  Medication Sig   amiodarone (PACERONE) 200 MG tablet Take 200 mg by mouth 2 (two) times daily.   Blood Glucose Monitoring Suppl (ONETOUCH VERIO) w/Device KIT Use to check blood sugar 4 times a day.   Blood Pressure Monitoring (BLOOD PRESSURE MONITOR AUTOMAT) DEVI Use to check blood pressure twice daily.   bumetanide (BUMEX) 1 MG tablet Take 1 mg by mouth 2 (two) times daily.   carvedilol (COREG) 3.125 MG tablet Take 3.125 mg by mouth 2 (two) times daily.   cyclobenzaprine (FLEXERIL) 10 MG tablet Take 1 tablet (10 mg total) by mouth 3 (three) times daily as needed for muscle spasms.   diclofenac Sodium (VOLTAREN) 1 % GEL Apply 2 g topically 4 (four) times daily  as needed (leg pain).   ELIQUIS 2.5 MG TABS tablet Take 2.5 mg by mouth 2 (two) times daily. Sig: Daily as presciribed.   furosemide (LASIX) 80 MG tablet Take 80 mg by mouth 2 (two) times daily.   gabapentin (NEURONTIN) 300 MG capsule Take 300 mg by mouth 2 (two) times daily.    glucose blood (ONETOUCH VERIO) test strip Use to check blood sugar 4 times a day.   HYDROcodone-acetaminophen (NORCO) 5-325 MG tablet Take 1 tablet by mouth every 6 (six) hours as needed for up to 5 days for moderate pain.   isosorbide mononitrate (IMDUR) 30 MG 24 hr tablet Take 30 mg by mouth daily.   Lancets (ONETOUCH ULTRASOFT) lancets Use to check blood sugar 4 times a day.   LEVEMIR FLEXTOUCH 100 UNIT/ML FlexPen Inject 20 Units into the skin daily. Patient is prescribed 20 units daily. Does not take with regularity per Daughter   Lidocaine 5 % CREA Apply 1 application topically daily.   losartan (COZAAR) 100 MG tablet Take 100 mg by mouth daily.   Methoxy PEG-Epoetin Beta (MIRCERA IJ) Mircera   NIFEdipine (PROCARDIA-XL/NIFEDICAL-XL) 30 MG 24 hr tablet Take 30 mg by mouth daily.   rosuvastatin (CRESTOR) 40 MG tablet Take 40 mg by mouth daily.   sevelamer carbonate (RENVELA) 800 MG tablet Take 1,600 mg by mouth 3 (three) times daily.   traZODone (DESYREL) 50 MG tablet Take 1 tablet (50 mg total) by mouth at bedtime as needed for sleep.   triamcinolone cream (KENALOG) 0.1 % Apply 1 application topically 2 (two) times daily.  No facility-administered encounter medications on file as of 04/12/2020.    Patient Active Problem List   Diagnosis Date Noted   Vasculitic ulcer of lower extremity ([place]) 02/02/2020   Other thrombophilia ([place]) 01/19/2020   Pain in both lower extremities 12/25/2019   Atrial fibrillation ([place]) 11/11/2019   Hyperlipidemia associated with type 2 diabetes mellitus ([place]) 11/11/2019   Erectile dysfunction 11/11/2019   Heart failure with reduced ejection fraction ([place])  10/26/2019   Type 2 diabetes mellitus with ESRD (end-stage renal disease) ([place]) 10/04/2019   Chronic pain syndrome 10/04/2019   Insomnia 10/04/2019   Atherosclerotic heart disease of native coronary artery without angina pectoris 09/01/2019   Hypertensive heart and kidney disease with heart failure and chronic kidney disease stage V ([place]) 09/01/2019   Morbid obesity ([place]) 09/01/2019   Anemia in chronic kidney disease 07/25/2019   End stage renal disease ([place]) 07/25/2019   Secondary hyperparathyroidism of renal origin ([place]) 07/25/2019    Conditions to be addressed/monitored:CHF, HTN, DMII, ESRD and Chronic bilateral leg pain  Care Plan : RNCM: Chronic Kidney (Adult)  Updates made by Vanita Ingles since 04/12/2020 12:00 AM    Problem: RNCM: Adjustment to Chronic Kidney Disease   Priority: Medium    Long-Range Goal: RNCM: ESRD   Priority: Medium  Note:   Current Barriers:   Knowledge Deficits related to resources and support for effective management of ESRD  Chronic Disease Management support and education needs related to ESRD on HD  Lacks caregiver support.   Unable to independently manage ESRD  Does not attend all scheduled provider appointments  Lacks social connections  Does not maintain contact with provider office  Does not contact provider office for questions/concerns  Nurse Case Manager Clinical Goal(s):   Over the next 120 days, patient will verbalize understanding of plan for effective management of ESRD  Over the next 120 days, patient will work with RNCM, pcp and specialist  to address needs related to effective management of ESRD and HD  Over the next 120 days, patient will demonstrate a decrease in fluid overload  exacerbations as evidenced by euvolemic status and not complications related to dialysis  Over the next 120 days, patient will attend all scheduled medical appointments: 04-26-2020 at 10:40 am  Interventions:   1:1 collaboration with  Venita Lick, NP regarding development and update of comprehensive plan of care as evidenced by provider attestation and co-signature  Inter-disciplinary care team collaboration (see longitudinal plan of care)  Evaluation of current treatment plan related to ESRD on dialysis  and patient's adherence to plan as established by provider.  Advised patient to to call the office for changes in condition or questions  Provided education to patient re: eating healthy, going to dialysis treatments as scheduled, taking medications as ordered, and working with the CCM team to achieve health and wellness goals.   Reviewed medications with patient and discussed compliance   Patient Goals/Self-Care Activities Over the next 120 days, patient will:  - Patient will self administer medications as prescribed Patient will attend all scheduled provider appointments Patient will call pharmacy for medication refills Patient will call provider office for new concerns or questions Patient will work with BSW to address care coordination needs and will continue to work with the clinical team to address health care and disease management related needs.    Follow Up Plan: Telephone follow up appointment with care management team member scheduled for: 06-07-2020 at 0900 am       Care  Plan : RNCM: Chronic Pain (Adult)  Updates made by Vanita Ingles since 04/12/2020 12:00 AM    Problem: RNCM: Pain Management Plan (Chronic Pain)   Priority: High    Goal: RNCM: Pain Management Plan Developed   Priority: High  Note:   Current Barriers:   Knowledge Deficits related to managing acute/chronic pain  Non-adherence to scheduled provider appointments  Non-adherence to prescribed medication regimen  Difficulty obtaining medications  Chronic Disease Management support and education needs related to chronic pain  Unable to independently manage pain and discomfort   Does not attend all scheduled provider  appointments  Lacks social connections  Does not maintain contact with provider office  Does not contact provider office for questions/concerns  Nurse Case Manager Clinical Goal(s):   Over the next 120 days, patient will verbalize understanding of plan for managing pain  Over the next 120 days, patient will attend all scheduled medical appointments: 04-26-2020 at 10:40 am  Over the next 120  days patient will demonstrate use of different relaxation  skills and/or diversional activities to assist with pain reduction (distraction, imagery, relaxation, massage, acupressure, TENS, heat, and cold application  Over the next  120  days patient will report pain at a level less than 3 to 4 on a 10-10 rating scale  Over the next  120  days patient will use pharmacological and nonpharmacological pain relief strategies  Over the next 120  days patient will verbalize acceptable level of pain relief and ability to engage in desired activities  Over the next   120 days patient will engage in desired activities without an increase in pain level  Interventions:   Collaboration with [name] T, NP regarding development and update of comprehensive plan of care as evidenced by provider attestation and co-signature  Inter-disciplinary care team collaboration (see longitudinal plan of care)  - deep breathing, relaxation and mindfulness use promoted  - effectiveness of pharmacologic therapy monitored  - misuse of pain medication assessed  - motivation and barriers to change assessed and addressed  - mutually acceptable comfort goal set  - pain assessed  - pain treatment goals reviewed  Evaluation of current treatment plan related to chronic pain in bilateral legs  and patient's adherence to plan as established by provider.  Advised patient to call the office for changes in condition or worsening sx/sx of pain and discomfort   Provided education to patient re: writing down questions to  ask the specialist at his appointment coming up on 04-15-2020   Reviewed medications with patient and discussed compliance   Discussed plans with patient for ongoing care management follow up and provided patient with direct contact information for care management team  Allow patient to maintain a diary of pain ratings, timing, precipitating events, medications, treatments, and what works best to relieve pain,   Refer to support groups and self-help groups  Educate patient about the use of pharmacological interventions for pain management- antianxiety, antidepressants, NSAIDS, opioid analgesics,   Explain the importance of lifestyle modifications to effective pain management   Patient Goals/Self Care Activities:   Patient verbalizes understanding of plan to management  Self-administers medications as prescribed  Attends all scheduled provider appointments  Calls pharmacy for medication refills  Calls provider office for new concerns or questions  - mutually acceptable comfort goal set  - pain assessed  - pain management plan developed  - pain treatment goals reviewed  - patient response to treatment assessed  - sharing of pain management  plan with teachers and other caregivers encouraged  Follow Up Plan: Telephone follow up appointment with care management team member scheduled for: 06-07-2020 at 0900 am      Task: RNCM: Partner to Develop Chronic Pain Management Plan   Note:   Care Management Activities:    - mutually acceptable comfort goal set - pain assessed - pain management plan developed - pain treatment goals reviewed - patient response to treatment assessed - sharing of pain management plan with teachers and other caregivers encouraged       Care Plan : RNCM: Hypertension (Adult)  Updates made by Vanita Ingles since 04/12/2020 12:00 AM    Problem: RNCM: Hypertension (Hypertension)   Priority: Medium    Goal: RNCM: Hypertension Monitored   Priority:  Medium  Note:   Objective:   Last practice recorded BP readings:  BP Readings from Last 3 Encounters:  04/08/20 138/86  02/26/20 (!) 147/80  02/02/20 (!) 169/93     Most recent eGFR/CrCl: No results found for: EGFR  No components found for: CRCL Current Barriers:   Knowledge Deficits related to basic understanding of hypertension pathophysiology and self care management  Knowledge Deficits related to understanding of medications prescribed for management of hypertension  Non-adherence to scheduled provider appointments  Limited Social Support  Unable to independently manage HTN  Does not attend all scheduled provider appointments  Lacks social connections  Does not contact provider office for questions/concerns Case Manager Clinical Goal(s):   Over the next 120 days, patient will verbalize understanding of plan for hypertension management  Over the next 120 days, patient will attend all scheduled medical appointments: 04-26-2020 at 10:40 am  Over the next 120 days, patient will demonstrate improved adherence to prescribed treatment plan for hypertension as evidenced by taking all medications as prescribed, monitoring and recording blood pressure as directed, adhering to low sodium/DASH diet  Over the next 120 days, patient will demonstrate improved health management independence as evidenced by checking blood pressure as directed and notifying PCP if SBP>160 or DBP > 90, taking all medications as prescribe, and adhering to a low sodium diet as discussed.  Over the next 120 days, patient will verbalize basic understanding of hypertension disease process and self health management plan as evidenced by compliance with heart healthy diet, medications, and working with the CCM team to optimize health and well being  Interventions:   Collaboration with Venita Lick, NP regarding development and update of comprehensive plan of care as evidenced by provider attestation and  co-signature  Inter-disciplinary care team collaboration (see longitudinal plan of care)  Evaluation of current treatment plan related to hypertension self management and patient's adherence to plan as established by provider.  Provided education to patient re: stroke prevention, s/s of heart attack and stroke, DASH diet, complications of uncontrolled blood pressure  Reviewed medications with patient and discussed importance of compliance  Discussed plans with patient for ongoing care management follow up and provided patient with direct contact information for care management team  Advised patient, providing education and rationale, to monitor blood pressure daily and record, calling PCP for findings outside established parameters.   Reviewed scheduled/upcoming provider appointments including: 04-26-2020 Patient Goals/Self-Care Activities  Over the next 120 days, patient will:  - Self administers medications as prescribed Attends all scheduled provider appointments Calls provider office for new concerns, questions, or BP outside discussed parameters Checks BP and records as discussed Follows a low sodium diet/DASH diet - blood pressure trends reviewed - depression  screen reviewed - home or ambulatory blood pressure monitoring encouraged Follow Up Plan: Telephone follow up appointment with care management team member scheduled for: 06-07-2020 at 0900 am   Task: RNCM: Identify and Monitor Blood Pressure Elevation   Note:   Care Management Activities:    - blood pressure trends reviewed - depression screen reviewed - home or ambulatory blood pressure monitoring encouraged    Notes: Usually has blood pressures checked at dialysis. Could not give readings to [place] : RNCM: Heart Failure (Adult)  Updates made by Vanita Ingles since 04/12/2020 12:00 AM    Problem: RNCM: Symptom Exacerbation (Heart Failure)   Priority: Medium    Goal: RNCM: Symptom Exacerbation Prevented  or Minimized   Priority: Medium  Note:   Current Barriers:   Knowledge deficits related to basic heart failure pathophysiology and self care management  Unable to independently manage HF  Lacks social connections  Does not contact provider office for questions/concerns  Lack of scale in home  Financial strain  Dialysis patient- Tuesday, Thursday, Saturday Schedule Nurse Case Manager Clinical Goal(s):   Over the next 120 days, patient will weigh self daily and record  Over the next 120 days, patient will verbalize understanding of Heart Failure Action Plan and when to call doctor  Over the next 120 days, patient will take all Heart Failure mediations as prescribed Interventions:   Collaboration with Venita Lick, NP regarding development and update of comprehensive plan of care as evidenced by provider attestation and co-signature  Inter-disciplinary care team collaboration (see longitudinal plan of care)  Basic overview and discussion of pathophysiology of Heart Failure  Provided written and verbal education on low sodium diet  Reviewed Heart Failure Action Plan in depth and provided written copy  Assessed for scales in home  Discussed importance of daily weight  Reviewed role of diuretics in prevention of fluid overload  Patient Goals/Self-Care Activities  Over the next 120 days, patient will:  - Take Heart Failure Medications as prescribed - Weigh daily and record (notify MD with 3 lb weight gain over night or 5 lb in a week) - Follow CHF Action Plan - Adhere to low sodium diet - barriers to lifestyle changes reviewed and addressed - barriers to treatment reviewed and addressed - cognitive screening completed and reviewed - depression screen reviewed - health literacy screening completed or reviewed - healthy lifestyle promoted - rescue (action) plan developed - rescue (action) plan reviewed - self-awareness of signs/symptoms of worsening disease  encouraged Follow Up Plan: Telephone follow up appointment with care management team member scheduled for: 06-07-2020 at 0900 am   Task: RNCM: Identify and Minimize Risk of Heart Failure Exacerbation   Note:   Care Management Activities:    - barriers to lifestyle changes reviewed and addressed - barriers to treatment reviewed and addressed - cognitive screening completed and reviewed - depression screen reviewed - health literacy screening completed or reviewed - healthy lifestyle promoted - rescue (action) plan developed - rescue (action) plan reviewed - self-awareness of signs/symptoms of worsening disease encouraged         Plan:Telephone follow up appointment with care management team member scheduled for:  06-07-2020 at 0900 am  Noreene Larsson RN, MSN, [facility] Mobile: [phone]

## 2020-04-12 NOTE — Patient Instructions (Signed)
Visit Information  Goals Addressed              This Visit's Progress   .  RNCM: Manage Chronic Pain        Timeframe:  Short-Term Goal Priority:  High Start Date:                             Expected End Date:           07-13-2020            Follow Up Date 06-07-2020   - call for medicine refill 2 or 3 days before it runs out - develop a personal pain management plan - keep track of prescription refills - plan exercise or activity when pain is best controlled - track times pain is worst and when it is best - track what makes the pain worse and what makes it better - work slower and less intense when having pain    Why is this important?    Day-to-day life can be hard when you have chronic pain.   Pain medicine is just one piece of the treatment puzzle.   You can try these action steps to help you manage your pain.    Notes: Sees specialist on 04-15-2020 for evaluation and treatment     .  RNCM: Monitor and Manage My Blood Sugar-Diabetes Type 2        Timeframe:  Long-Range Goal Priority:  High Start Date:                             Expected End Date:                       Follow Up Date 06-07-2020   - check blood sugar at prescribed times - check blood sugar before and after exercise - check blood sugar if I feel it is too high or too low - enter blood sugar readings and medication or insulin into daily log    Why is this important?    Checking your blood sugar at home helps to keep it from getting very high or very low.   Writing the results in a diary or log helps the doctor know how to care for you.   Your blood sugar log should have the time, date and the results.   Also, write down the amount of insulin or other medicine that you take.   Other information, like what you ate, exercise done and how you were feeling, will also be helpful.     Notes: Does not check blood sugars at home but does have blood sugars checked at dialysis    .  COMPLETED: RNCM:  Pt- "I had my blood sugar checked this am at dialysis" (pt-stated)        CARE PLAN ENTRY (see longtitudinal plan of care for additional care plan information)  Objective: closing and opening in new ELS . No results found for: HGBA1C . No results found for: CREATININE . No results found for: EGFR  Current Barriers:  Marland Kitchen Knowledge Deficits related to basic Diabetes pathophysiology and self care/management . Knowledge Deficits related to medications used for management of diabetes . Does not have glucometer to monitor blood sugar- only has blood sugars checked at dialysis- says he does not have a meter . Literacy barriers . Knowledge Deficits related to self administration  of insulin . Limited Social Support  Case Manager Clinical Goal(s):  Over the next 120 days, patient will demonstrate improved adherence to prescribed treatment plan for diabetes self care/management as evidenced by:  . daily monitoring and recording of CBG  . adherence to ADA/ carb modified diet . exercise 7 days/week . adherence to prescribed medication regimen  Interventions:  . Provided education to patient about basic DM disease process . Reviewed medications with patient and discussed importance of medication adherence.  02-16-2020: The patient states that he is compliant with his medications and his daughter helps with medication management.  . Discussed plans with patient for ongoing care management follow up and provided patient with direct contact information for care management team . Provided patient with written educational materials related to hypo and hyperglycemia and importance of correct treatment.  02-16-2020: The patient states that they check it at dialysis and it is good. Could not give any other readings. Discussed highs and lows. The patient denies any issues.  . Advised patient, providing education and rationale, to check cbg daily and record, calling pcp for findings outside established  parameters. 12-29-2019: The patient had not taken his blood sugar this am but states it is good. Could not provide the RNCM with numbers. Ask for patient to take and write numbers down for the [facility] to have on outreach calls.  . Referral made to pharmacy team for assistance with support and education on chronic conditions.  02-16-2020:  Ongoing support and education.  . Referral made to social work team for assistance with help with noncompliance and social needs.  02-16-2020: the patient declined LCSW assistance.  . Review of patient status, including review of consultants reports, relevant laboratory and other test results, and medications completed. . Collaboration with the pcp and pharmacist to get a glucose meter script sent to [place] so the patient can check blood sugars at home.  02-16-2020: The patient states that he has the means to check blood sugars at home. Could not provided numbers for the [facility] but states it is "good"  Patient Self Care Activities:  . UNABLE to independently manage diabetes  . Self administers oral medications as prescribed . Self administers insulin as prescribed . Checks blood sugars as prescribed and utilize hyper and hypoglycemia protocol as needed  Please see past updates related to this goal by clicking on the "Past Updates" button in the selected goal      .  COMPLETED: RNCM: Pt-"I get my blood pressure checked at dialysis" (pt-stated)        CARE PLAN ENTRY (see longtitudinal plan of care for additional care plan information)  Current Barriers: Closing this and opening in new ELS . Chronic Disease Management support, education, and care coordination needs related to Atrial Fibrillation, CHF, CAD, HTN, HLD, and ESRD  Clinical Goal(s) related to Atrial Fibrillation, CHF, CAD, HTN, HLD, and ESRD:  Over the next 120 days, patient will:  . Work with the care management team to address educational, disease management, and care coordination needs   . Begin or continue self health monitoring activities as directed today Measure and record blood pressure 2/3 times per week, Measure and record weight daily, and adhere to a heart healthy/ADA diet . Call provider office for new or worsened signs and symptoms Blood pressure findings outside established parameters, Weight outside established parameters, Oxygen saturation lower than established parameter, Chest pain, Shortness of breath, and New or worsened symptom related to ESRD and other chronic conditions.  Marland Kitchen  Call care management team with questions or concerns . Verbalize basic understanding of patient centered plan of care established today  Interventions related to Atrial Fibrillation, CHF, CAD, HTN, HLD, and ESRD:  . Evaluation of current treatment plans and patient's adherence to plan as established by provider.  The patient states he has dialysis 4 days a week. Could not give the [facility] specific schedule. States he gets his blood pressure checked there. Says he does not have a way to check it at home. The patient denies headache or issues with following plan of care. 02-16-2020: The patient is still complaining of leg pain and discomfort. States the gel is not helping. Education on writing down questions to ask the provider at the visit so his concerns could be addressed. The patient has seen improvements in his sleep patterns since he has started taking the Trazadone. Eduction and support given. The patient sees pcp on 02-26-2020 and pain specialist after Christmas. The patient states he is out of pain medications. Encouraged the patient to discuss with providers options.  . Assessed patient understanding of disease states. The patient states that he understands his conditions but when asking questions about his conditions he does not know the answers to why he takes a certain medication or what readings he is having for his blood pressure at dialysis. Does live with his daughter and she helps him  with medications. 02-16-2020: The patient states that he had the COVID vaccine in [place] and will get the record for the [facility] and report on next outreach. (Still unable to supply at this outreach- reminder given) States he is doing well except for the pain in the calves of his legs.  Denies any concerns with other chronic conditions.  . Assessed patient's education and care coordination needs.  The patient verbalized that he does not have a blood pressure cuff or a glucose meter. Will follow up with the pcp and pharmacist concerning a script for this. Likely he will have to get a blood pressure cuff through the over the counter product book.  . Provided disease specific education to patient.  Education on heart healthy/ADA diet. The patient states he does not eat salt and he watches his sugar intake. Eats baked foods. 02-16-2020: The patient states he is compliant with heart healthy/ADA diet. Will continue to monitor.  Nash Dimmer with appropriate clinical care team members regarding patient needs.  The patient is currently working with the pharmacist and has an upcoming appointment with the LCSW. 02-16-2020- declined services of LCSW. Continues to work with [employer]. Will collaborate as needed with the CCM pharmacist.  . Evaluation of upcoming appointments:  Will follow up with the pcp on 02-26-2020.  Has appointment with the specialist for pain after Christmas.   Patient Self Care Activities related to Atrial Fibrillation, CHF, CAD, HTN, HLD, and ESRD:  . Patient is unable to independently self-manage chronic health conditions  Please see past updates related to this goal by clicking on the "Past Updates" button in the selected goal         The patient verbalized understanding of instructions, educational materials, and care plan provided today and declined offer to receive copy of patient instructions, educational materials, and care plan.   Telephone follow up appointment with care management team  member scheduled for: 06-07-2020 at 0900 am  Noreene Larsson RN, MSN, [facility] Mobile: [phone]

## 2020-04-13 DIAGNOSIS — N186 End stage renal disease: Secondary | ICD-10-CM | POA: Diagnosis not present

## 2020-04-13 DIAGNOSIS — D689 Coagulation defect, unspecified: Secondary | ICD-10-CM | POA: Diagnosis not present

## 2020-04-13 DIAGNOSIS — E8770 Fluid overload, unspecified: Secondary | ICD-10-CM | POA: Diagnosis not present

## 2020-04-13 DIAGNOSIS — N2581 Secondary hyperparathyroidism of renal origin: Secondary | ICD-10-CM | POA: Diagnosis not present

## 2020-04-13 DIAGNOSIS — R52 Pain, unspecified: Secondary | ICD-10-CM | POA: Diagnosis not present

## 2020-04-13 DIAGNOSIS — Z992 Dependence on renal dialysis: Secondary | ICD-10-CM | POA: Diagnosis not present

## 2020-04-13 DIAGNOSIS — D509 Iron deficiency anemia, unspecified: Secondary | ICD-10-CM | POA: Diagnosis not present

## 2020-04-15 DIAGNOSIS — I120 Hypertensive chronic kidney disease with stage 5 chronic kidney disease or end stage renal disease: Secondary | ICD-10-CM | POA: Diagnosis not present

## 2020-04-15 DIAGNOSIS — N186 End stage renal disease: Secondary | ICD-10-CM | POA: Diagnosis not present

## 2020-04-15 DIAGNOSIS — Z992 Dependence on renal dialysis: Secondary | ICD-10-CM | POA: Diagnosis not present

## 2020-04-16 DIAGNOSIS — R52 Pain, unspecified: Secondary | ICD-10-CM | POA: Diagnosis not present

## 2020-04-16 DIAGNOSIS — N186 End stage renal disease: Secondary | ICD-10-CM | POA: Diagnosis not present

## 2020-04-16 DIAGNOSIS — Z23 Encounter for immunization: Secondary | ICD-10-CM | POA: Diagnosis not present

## 2020-04-16 DIAGNOSIS — E8770 Fluid overload, unspecified: Secondary | ICD-10-CM | POA: Diagnosis not present

## 2020-04-16 DIAGNOSIS — D689 Coagulation defect, unspecified: Secondary | ICD-10-CM | POA: Diagnosis not present

## 2020-04-16 DIAGNOSIS — Z992 Dependence on renal dialysis: Secondary | ICD-10-CM | POA: Diagnosis not present

## 2020-04-16 DIAGNOSIS — N2581 Secondary hyperparathyroidism of renal origin: Secondary | ICD-10-CM | POA: Diagnosis not present

## 2020-04-16 DIAGNOSIS — E8779 Other fluid overload: Secondary | ICD-10-CM | POA: Diagnosis not present

## 2020-04-17 DIAGNOSIS — N186 End stage renal disease: Secondary | ICD-10-CM | POA: Diagnosis not present

## 2020-04-17 DIAGNOSIS — R52 Pain, unspecified: Secondary | ICD-10-CM | POA: Diagnosis not present

## 2020-04-17 DIAGNOSIS — N2581 Secondary hyperparathyroidism of renal origin: Secondary | ICD-10-CM | POA: Diagnosis not present

## 2020-04-17 DIAGNOSIS — E8770 Fluid overload, unspecified: Secondary | ICD-10-CM | POA: Diagnosis not present

## 2020-04-17 DIAGNOSIS — Z992 Dependence on renal dialysis: Secondary | ICD-10-CM | POA: Diagnosis not present

## 2020-04-17 DIAGNOSIS — Z23 Encounter for immunization: Secondary | ICD-10-CM | POA: Diagnosis not present

## 2020-04-17 DIAGNOSIS — E8779 Other fluid overload: Secondary | ICD-10-CM | POA: Diagnosis not present

## 2020-04-17 DIAGNOSIS — D689 Coagulation defect, unspecified: Secondary | ICD-10-CM | POA: Diagnosis not present

## 2020-04-18 DIAGNOSIS — Z23 Encounter for immunization: Secondary | ICD-10-CM | POA: Diagnosis not present

## 2020-04-18 DIAGNOSIS — R52 Pain, unspecified: Secondary | ICD-10-CM | POA: Diagnosis not present

## 2020-04-18 DIAGNOSIS — N2581 Secondary hyperparathyroidism of renal origin: Secondary | ICD-10-CM | POA: Diagnosis not present

## 2020-04-18 DIAGNOSIS — Z992 Dependence on renal dialysis: Secondary | ICD-10-CM | POA: Diagnosis not present

## 2020-04-18 DIAGNOSIS — D689 Coagulation defect, unspecified: Secondary | ICD-10-CM | POA: Diagnosis not present

## 2020-04-18 DIAGNOSIS — E8770 Fluid overload, unspecified: Secondary | ICD-10-CM | POA: Diagnosis not present

## 2020-04-18 DIAGNOSIS — E8779 Other fluid overload: Secondary | ICD-10-CM | POA: Diagnosis not present

## 2020-04-18 DIAGNOSIS — N186 End stage renal disease: Secondary | ICD-10-CM | POA: Diagnosis not present

## 2020-04-20 DIAGNOSIS — N2581 Secondary hyperparathyroidism of renal origin: Secondary | ICD-10-CM | POA: Diagnosis not present

## 2020-04-20 DIAGNOSIS — R52 Pain, unspecified: Secondary | ICD-10-CM | POA: Diagnosis not present

## 2020-04-20 DIAGNOSIS — Z23 Encounter for immunization: Secondary | ICD-10-CM | POA: Diagnosis not present

## 2020-04-20 DIAGNOSIS — E8770 Fluid overload, unspecified: Secondary | ICD-10-CM | POA: Diagnosis not present

## 2020-04-20 DIAGNOSIS — Z992 Dependence on renal dialysis: Secondary | ICD-10-CM | POA: Diagnosis not present

## 2020-04-20 DIAGNOSIS — N186 End stage renal disease: Secondary | ICD-10-CM | POA: Diagnosis not present

## 2020-04-20 DIAGNOSIS — D689 Coagulation defect, unspecified: Secondary | ICD-10-CM | POA: Diagnosis not present

## 2020-04-20 DIAGNOSIS — E8779 Other fluid overload: Secondary | ICD-10-CM | POA: Diagnosis not present

## 2020-04-23 DIAGNOSIS — Z992 Dependence on renal dialysis: Secondary | ICD-10-CM | POA: Diagnosis not present

## 2020-04-23 DIAGNOSIS — N186 End stage renal disease: Secondary | ICD-10-CM | POA: Diagnosis not present

## 2020-04-23 DIAGNOSIS — E8770 Fluid overload, unspecified: Secondary | ICD-10-CM | POA: Diagnosis not present

## 2020-04-23 DIAGNOSIS — D689 Coagulation defect, unspecified: Secondary | ICD-10-CM | POA: Diagnosis not present

## 2020-04-23 DIAGNOSIS — Z23 Encounter for immunization: Secondary | ICD-10-CM | POA: Diagnosis not present

## 2020-04-23 DIAGNOSIS — N2581 Secondary hyperparathyroidism of renal origin: Secondary | ICD-10-CM | POA: Diagnosis not present

## 2020-04-23 DIAGNOSIS — E8779 Other fluid overload: Secondary | ICD-10-CM | POA: Diagnosis not present

## 2020-04-23 DIAGNOSIS — R52 Pain, unspecified: Secondary | ICD-10-CM | POA: Diagnosis not present

## 2020-04-24 DIAGNOSIS — N2581 Secondary hyperparathyroidism of renal origin: Secondary | ICD-10-CM | POA: Diagnosis not present

## 2020-04-24 DIAGNOSIS — D689 Coagulation defect, unspecified: Secondary | ICD-10-CM | POA: Diagnosis not present

## 2020-04-24 DIAGNOSIS — N186 End stage renal disease: Secondary | ICD-10-CM | POA: Diagnosis not present

## 2020-04-24 DIAGNOSIS — Z23 Encounter for immunization: Secondary | ICD-10-CM | POA: Diagnosis not present

## 2020-04-24 DIAGNOSIS — E8779 Other fluid overload: Secondary | ICD-10-CM | POA: Diagnosis not present

## 2020-04-24 DIAGNOSIS — R52 Pain, unspecified: Secondary | ICD-10-CM | POA: Diagnosis not present

## 2020-04-24 DIAGNOSIS — E8770 Fluid overload, unspecified: Secondary | ICD-10-CM | POA: Diagnosis not present

## 2020-04-24 DIAGNOSIS — Z992 Dependence on renal dialysis: Secondary | ICD-10-CM | POA: Diagnosis not present

## 2020-04-25 DIAGNOSIS — Z23 Encounter for immunization: Secondary | ICD-10-CM | POA: Diagnosis not present

## 2020-04-25 DIAGNOSIS — N2581 Secondary hyperparathyroidism of renal origin: Secondary | ICD-10-CM | POA: Diagnosis not present

## 2020-04-25 DIAGNOSIS — R52 Pain, unspecified: Secondary | ICD-10-CM | POA: Diagnosis not present

## 2020-04-25 DIAGNOSIS — E8770 Fluid overload, unspecified: Secondary | ICD-10-CM | POA: Diagnosis not present

## 2020-04-25 DIAGNOSIS — Z992 Dependence on renal dialysis: Secondary | ICD-10-CM | POA: Diagnosis not present

## 2020-04-25 DIAGNOSIS — E8779 Other fluid overload: Secondary | ICD-10-CM | POA: Diagnosis not present

## 2020-04-25 DIAGNOSIS — N186 End stage renal disease: Secondary | ICD-10-CM | POA: Diagnosis not present

## 2020-04-25 DIAGNOSIS — D689 Coagulation defect, unspecified: Secondary | ICD-10-CM | POA: Diagnosis not present

## 2020-04-26 ENCOUNTER — Ambulatory Visit: Payer: Medicare Other | Admitting: Nurse Practitioner

## 2020-04-27 DIAGNOSIS — N2581 Secondary hyperparathyroidism of renal origin: Secondary | ICD-10-CM | POA: Diagnosis not present

## 2020-04-27 DIAGNOSIS — Z992 Dependence on renal dialysis: Secondary | ICD-10-CM | POA: Diagnosis not present

## 2020-04-27 DIAGNOSIS — N186 End stage renal disease: Secondary | ICD-10-CM | POA: Diagnosis not present

## 2020-04-27 DIAGNOSIS — Z23 Encounter for immunization: Secondary | ICD-10-CM | POA: Diagnosis not present

## 2020-04-27 DIAGNOSIS — D689 Coagulation defect, unspecified: Secondary | ICD-10-CM | POA: Diagnosis not present

## 2020-04-27 DIAGNOSIS — E8779 Other fluid overload: Secondary | ICD-10-CM | POA: Diagnosis not present

## 2020-04-27 DIAGNOSIS — R52 Pain, unspecified: Secondary | ICD-10-CM | POA: Diagnosis not present

## 2020-04-27 DIAGNOSIS — E8770 Fluid overload, unspecified: Secondary | ICD-10-CM | POA: Diagnosis not present

## 2020-04-30 DIAGNOSIS — E8770 Fluid overload, unspecified: Secondary | ICD-10-CM | POA: Diagnosis not present

## 2020-04-30 DIAGNOSIS — N186 End stage renal disease: Secondary | ICD-10-CM | POA: Diagnosis not present

## 2020-04-30 DIAGNOSIS — R52 Pain, unspecified: Secondary | ICD-10-CM | POA: Diagnosis not present

## 2020-04-30 DIAGNOSIS — Z992 Dependence on renal dialysis: Secondary | ICD-10-CM | POA: Diagnosis not present

## 2020-04-30 DIAGNOSIS — N2581 Secondary hyperparathyroidism of renal origin: Secondary | ICD-10-CM | POA: Diagnosis not present

## 2020-04-30 DIAGNOSIS — D689 Coagulation defect, unspecified: Secondary | ICD-10-CM | POA: Diagnosis not present

## 2020-04-30 DIAGNOSIS — Z23 Encounter for immunization: Secondary | ICD-10-CM | POA: Diagnosis not present

## 2020-04-30 DIAGNOSIS — E8779 Other fluid overload: Secondary | ICD-10-CM | POA: Diagnosis not present

## 2020-05-01 DIAGNOSIS — R52 Pain, unspecified: Secondary | ICD-10-CM | POA: Diagnosis not present

## 2020-05-01 DIAGNOSIS — N186 End stage renal disease: Secondary | ICD-10-CM | POA: Diagnosis not present

## 2020-05-01 DIAGNOSIS — Z992 Dependence on renal dialysis: Secondary | ICD-10-CM | POA: Diagnosis not present

## 2020-05-01 DIAGNOSIS — N2581 Secondary hyperparathyroidism of renal origin: Secondary | ICD-10-CM | POA: Diagnosis not present

## 2020-05-01 DIAGNOSIS — E8779 Other fluid overload: Secondary | ICD-10-CM | POA: Diagnosis not present

## 2020-05-01 DIAGNOSIS — D689 Coagulation defect, unspecified: Secondary | ICD-10-CM | POA: Diagnosis not present

## 2020-05-01 DIAGNOSIS — E8770 Fluid overload, unspecified: Secondary | ICD-10-CM | POA: Diagnosis not present

## 2020-05-01 DIAGNOSIS — Z23 Encounter for immunization: Secondary | ICD-10-CM | POA: Diagnosis not present

## 2020-05-02 DIAGNOSIS — R52 Pain, unspecified: Secondary | ICD-10-CM | POA: Diagnosis not present

## 2020-05-02 DIAGNOSIS — E8779 Other fluid overload: Secondary | ICD-10-CM | POA: Diagnosis not present

## 2020-05-02 DIAGNOSIS — Z23 Encounter for immunization: Secondary | ICD-10-CM | POA: Diagnosis not present

## 2020-05-02 DIAGNOSIS — N186 End stage renal disease: Secondary | ICD-10-CM | POA: Diagnosis not present

## 2020-05-02 DIAGNOSIS — E8770 Fluid overload, unspecified: Secondary | ICD-10-CM | POA: Diagnosis not present

## 2020-05-02 DIAGNOSIS — N2581 Secondary hyperparathyroidism of renal origin: Secondary | ICD-10-CM | POA: Diagnosis not present

## 2020-05-02 DIAGNOSIS — D689 Coagulation defect, unspecified: Secondary | ICD-10-CM | POA: Diagnosis not present

## 2020-05-02 DIAGNOSIS — Z992 Dependence on renal dialysis: Secondary | ICD-10-CM | POA: Diagnosis not present

## 2020-05-04 DIAGNOSIS — N2581 Secondary hyperparathyroidism of renal origin: Secondary | ICD-10-CM | POA: Diagnosis not present

## 2020-05-04 DIAGNOSIS — E8770 Fluid overload, unspecified: Secondary | ICD-10-CM | POA: Diagnosis not present

## 2020-05-04 DIAGNOSIS — Z992 Dependence on renal dialysis: Secondary | ICD-10-CM | POA: Diagnosis not present

## 2020-05-04 DIAGNOSIS — E8779 Other fluid overload: Secondary | ICD-10-CM | POA: Diagnosis not present

## 2020-05-04 DIAGNOSIS — R52 Pain, unspecified: Secondary | ICD-10-CM | POA: Diagnosis not present

## 2020-05-04 DIAGNOSIS — N186 End stage renal disease: Secondary | ICD-10-CM | POA: Diagnosis not present

## 2020-05-04 DIAGNOSIS — D689 Coagulation defect, unspecified: Secondary | ICD-10-CM | POA: Diagnosis not present

## 2020-05-04 DIAGNOSIS — Z23 Encounter for immunization: Secondary | ICD-10-CM | POA: Diagnosis not present

## 2020-05-07 DIAGNOSIS — R52 Pain, unspecified: Secondary | ICD-10-CM | POA: Diagnosis not present

## 2020-05-07 DIAGNOSIS — Z23 Encounter for immunization: Secondary | ICD-10-CM | POA: Diagnosis not present

## 2020-05-07 DIAGNOSIS — Z992 Dependence on renal dialysis: Secondary | ICD-10-CM | POA: Diagnosis not present

## 2020-05-07 DIAGNOSIS — E8779 Other fluid overload: Secondary | ICD-10-CM | POA: Diagnosis not present

## 2020-05-07 DIAGNOSIS — N186 End stage renal disease: Secondary | ICD-10-CM | POA: Diagnosis not present

## 2020-05-07 DIAGNOSIS — N2581 Secondary hyperparathyroidism of renal origin: Secondary | ICD-10-CM | POA: Diagnosis not present

## 2020-05-07 DIAGNOSIS — E8770 Fluid overload, unspecified: Secondary | ICD-10-CM | POA: Diagnosis not present

## 2020-05-07 DIAGNOSIS — D689 Coagulation defect, unspecified: Secondary | ICD-10-CM | POA: Diagnosis not present

## 2020-05-08 DIAGNOSIS — R52 Pain, unspecified: Secondary | ICD-10-CM | POA: Diagnosis not present

## 2020-05-08 DIAGNOSIS — Z992 Dependence on renal dialysis: Secondary | ICD-10-CM | POA: Diagnosis not present

## 2020-05-08 DIAGNOSIS — N186 End stage renal disease: Secondary | ICD-10-CM | POA: Diagnosis not present

## 2020-05-08 DIAGNOSIS — Z23 Encounter for immunization: Secondary | ICD-10-CM | POA: Diagnosis not present

## 2020-05-08 DIAGNOSIS — E8770 Fluid overload, unspecified: Secondary | ICD-10-CM | POA: Diagnosis not present

## 2020-05-08 DIAGNOSIS — E8779 Other fluid overload: Secondary | ICD-10-CM | POA: Diagnosis not present

## 2020-05-08 DIAGNOSIS — N2581 Secondary hyperparathyroidism of renal origin: Secondary | ICD-10-CM | POA: Diagnosis not present

## 2020-05-08 DIAGNOSIS — D689 Coagulation defect, unspecified: Secondary | ICD-10-CM | POA: Diagnosis not present

## 2020-05-09 DIAGNOSIS — R52 Pain, unspecified: Secondary | ICD-10-CM | POA: Diagnosis not present

## 2020-05-09 DIAGNOSIS — E8770 Fluid overload, unspecified: Secondary | ICD-10-CM | POA: Diagnosis not present

## 2020-05-09 DIAGNOSIS — Z23 Encounter for immunization: Secondary | ICD-10-CM | POA: Diagnosis not present

## 2020-05-09 DIAGNOSIS — N186 End stage renal disease: Secondary | ICD-10-CM | POA: Diagnosis not present

## 2020-05-09 DIAGNOSIS — E8779 Other fluid overload: Secondary | ICD-10-CM | POA: Diagnosis not present

## 2020-05-09 DIAGNOSIS — D689 Coagulation defect, unspecified: Secondary | ICD-10-CM | POA: Diagnosis not present

## 2020-05-09 DIAGNOSIS — N2581 Secondary hyperparathyroidism of renal origin: Secondary | ICD-10-CM | POA: Diagnosis not present

## 2020-05-09 DIAGNOSIS — Z992 Dependence on renal dialysis: Secondary | ICD-10-CM | POA: Diagnosis not present

## 2020-05-11 DIAGNOSIS — E8779 Other fluid overload: Secondary | ICD-10-CM | POA: Diagnosis not present

## 2020-05-11 DIAGNOSIS — Z23 Encounter for immunization: Secondary | ICD-10-CM | POA: Diagnosis not present

## 2020-05-11 DIAGNOSIS — D689 Coagulation defect, unspecified: Secondary | ICD-10-CM | POA: Diagnosis not present

## 2020-05-11 DIAGNOSIS — N186 End stage renal disease: Secondary | ICD-10-CM | POA: Diagnosis not present

## 2020-05-11 DIAGNOSIS — E8770 Fluid overload, unspecified: Secondary | ICD-10-CM | POA: Diagnosis not present

## 2020-05-11 DIAGNOSIS — Z992 Dependence on renal dialysis: Secondary | ICD-10-CM | POA: Diagnosis not present

## 2020-05-11 DIAGNOSIS — R52 Pain, unspecified: Secondary | ICD-10-CM | POA: Diagnosis not present

## 2020-05-11 DIAGNOSIS — N2581 Secondary hyperparathyroidism of renal origin: Secondary | ICD-10-CM | POA: Diagnosis not present

## 2020-05-13 DIAGNOSIS — N186 End stage renal disease: Secondary | ICD-10-CM | POA: Diagnosis not present

## 2020-05-13 DIAGNOSIS — Z992 Dependence on renal dialysis: Secondary | ICD-10-CM | POA: Diagnosis not present

## 2020-05-13 DIAGNOSIS — I120 Hypertensive chronic kidney disease with stage 5 chronic kidney disease or end stage renal disease: Secondary | ICD-10-CM | POA: Diagnosis not present

## 2020-05-14 DIAGNOSIS — Z992 Dependence on renal dialysis: Secondary | ICD-10-CM | POA: Diagnosis not present

## 2020-05-14 DIAGNOSIS — D689 Coagulation defect, unspecified: Secondary | ICD-10-CM | POA: Diagnosis not present

## 2020-05-14 DIAGNOSIS — R52 Pain, unspecified: Secondary | ICD-10-CM | POA: Diagnosis not present

## 2020-05-14 DIAGNOSIS — N2581 Secondary hyperparathyroidism of renal origin: Secondary | ICD-10-CM | POA: Diagnosis not present

## 2020-05-14 DIAGNOSIS — E8770 Fluid overload, unspecified: Secondary | ICD-10-CM | POA: Diagnosis not present

## 2020-05-14 DIAGNOSIS — N186 End stage renal disease: Secondary | ICD-10-CM | POA: Diagnosis not present

## 2020-05-15 DIAGNOSIS — N186 End stage renal disease: Secondary | ICD-10-CM | POA: Diagnosis not present

## 2020-05-15 DIAGNOSIS — R52 Pain, unspecified: Secondary | ICD-10-CM | POA: Diagnosis not present

## 2020-05-15 DIAGNOSIS — N2581 Secondary hyperparathyroidism of renal origin: Secondary | ICD-10-CM | POA: Diagnosis not present

## 2020-05-15 DIAGNOSIS — D689 Coagulation defect, unspecified: Secondary | ICD-10-CM | POA: Diagnosis not present

## 2020-05-15 DIAGNOSIS — Z992 Dependence on renal dialysis: Secondary | ICD-10-CM | POA: Diagnosis not present

## 2020-05-15 DIAGNOSIS — E8770 Fluid overload, unspecified: Secondary | ICD-10-CM | POA: Diagnosis not present

## 2020-05-16 DIAGNOSIS — E8770 Fluid overload, unspecified: Secondary | ICD-10-CM | POA: Diagnosis not present

## 2020-05-16 DIAGNOSIS — D689 Coagulation defect, unspecified: Secondary | ICD-10-CM | POA: Diagnosis not present

## 2020-05-16 DIAGNOSIS — R52 Pain, unspecified: Secondary | ICD-10-CM | POA: Diagnosis not present

## 2020-05-16 DIAGNOSIS — Z992 Dependence on renal dialysis: Secondary | ICD-10-CM | POA: Diagnosis not present

## 2020-05-16 DIAGNOSIS — N2581 Secondary hyperparathyroidism of renal origin: Secondary | ICD-10-CM | POA: Diagnosis not present

## 2020-05-16 DIAGNOSIS — N186 End stage renal disease: Secondary | ICD-10-CM | POA: Diagnosis not present

## 2020-05-18 DIAGNOSIS — E8770 Fluid overload, unspecified: Secondary | ICD-10-CM | POA: Diagnosis not present

## 2020-05-18 DIAGNOSIS — Z992 Dependence on renal dialysis: Secondary | ICD-10-CM | POA: Diagnosis not present

## 2020-05-18 DIAGNOSIS — N2581 Secondary hyperparathyroidism of renal origin: Secondary | ICD-10-CM | POA: Diagnosis not present

## 2020-05-18 DIAGNOSIS — N186 End stage renal disease: Secondary | ICD-10-CM | POA: Diagnosis not present

## 2020-05-18 DIAGNOSIS — R52 Pain, unspecified: Secondary | ICD-10-CM | POA: Diagnosis not present

## 2020-05-18 DIAGNOSIS — D689 Coagulation defect, unspecified: Secondary | ICD-10-CM | POA: Diagnosis not present

## 2020-05-21 DIAGNOSIS — R519 Headache, unspecified: Secondary | ICD-10-CM | POA: Diagnosis not present

## 2020-05-21 DIAGNOSIS — E8770 Fluid overload, unspecified: Secondary | ICD-10-CM | POA: Diagnosis not present

## 2020-05-21 DIAGNOSIS — N186 End stage renal disease: Secondary | ICD-10-CM | POA: Diagnosis not present

## 2020-05-21 DIAGNOSIS — N2581 Secondary hyperparathyroidism of renal origin: Secondary | ICD-10-CM | POA: Diagnosis not present

## 2020-05-21 DIAGNOSIS — R52 Pain, unspecified: Secondary | ICD-10-CM | POA: Diagnosis not present

## 2020-05-21 DIAGNOSIS — Z992 Dependence on renal dialysis: Secondary | ICD-10-CM | POA: Diagnosis not present

## 2020-05-21 DIAGNOSIS — D689 Coagulation defect, unspecified: Secondary | ICD-10-CM | POA: Diagnosis not present

## 2020-05-22 DIAGNOSIS — E8770 Fluid overload, unspecified: Secondary | ICD-10-CM | POA: Diagnosis not present

## 2020-05-22 DIAGNOSIS — N2581 Secondary hyperparathyroidism of renal origin: Secondary | ICD-10-CM | POA: Diagnosis not present

## 2020-05-22 DIAGNOSIS — R519 Headache, unspecified: Secondary | ICD-10-CM | POA: Diagnosis not present

## 2020-05-22 DIAGNOSIS — D689 Coagulation defect, unspecified: Secondary | ICD-10-CM | POA: Diagnosis not present

## 2020-05-22 DIAGNOSIS — Z992 Dependence on renal dialysis: Secondary | ICD-10-CM | POA: Diagnosis not present

## 2020-05-22 DIAGNOSIS — N186 End stage renal disease: Secondary | ICD-10-CM | POA: Diagnosis not present

## 2020-05-22 DIAGNOSIS — R52 Pain, unspecified: Secondary | ICD-10-CM | POA: Diagnosis not present

## 2020-05-23 DIAGNOSIS — D689 Coagulation defect, unspecified: Secondary | ICD-10-CM | POA: Diagnosis not present

## 2020-05-23 DIAGNOSIS — Z992 Dependence on renal dialysis: Secondary | ICD-10-CM | POA: Diagnosis not present

## 2020-05-23 DIAGNOSIS — R519 Headache, unspecified: Secondary | ICD-10-CM | POA: Diagnosis not present

## 2020-05-23 DIAGNOSIS — R52 Pain, unspecified: Secondary | ICD-10-CM | POA: Diagnosis not present

## 2020-05-23 DIAGNOSIS — N2581 Secondary hyperparathyroidism of renal origin: Secondary | ICD-10-CM | POA: Diagnosis not present

## 2020-05-23 DIAGNOSIS — N186 End stage renal disease: Secondary | ICD-10-CM | POA: Diagnosis not present

## 2020-05-23 DIAGNOSIS — E8770 Fluid overload, unspecified: Secondary | ICD-10-CM | POA: Diagnosis not present

## 2020-05-25 DIAGNOSIS — Z992 Dependence on renal dialysis: Secondary | ICD-10-CM | POA: Diagnosis not present

## 2020-05-25 DIAGNOSIS — D689 Coagulation defect, unspecified: Secondary | ICD-10-CM | POA: Diagnosis not present

## 2020-05-25 DIAGNOSIS — E8770 Fluid overload, unspecified: Secondary | ICD-10-CM | POA: Diagnosis not present

## 2020-05-25 DIAGNOSIS — N2581 Secondary hyperparathyroidism of renal origin: Secondary | ICD-10-CM | POA: Diagnosis not present

## 2020-05-25 DIAGNOSIS — R52 Pain, unspecified: Secondary | ICD-10-CM | POA: Diagnosis not present

## 2020-05-25 DIAGNOSIS — N186 End stage renal disease: Secondary | ICD-10-CM | POA: Diagnosis not present

## 2020-05-25 DIAGNOSIS — R519 Headache, unspecified: Secondary | ICD-10-CM | POA: Diagnosis not present

## 2020-05-28 DIAGNOSIS — D689 Coagulation defect, unspecified: Secondary | ICD-10-CM | POA: Diagnosis not present

## 2020-05-28 DIAGNOSIS — L299 Pruritus, unspecified: Secondary | ICD-10-CM | POA: Diagnosis not present

## 2020-05-28 DIAGNOSIS — N2581 Secondary hyperparathyroidism of renal origin: Secondary | ICD-10-CM | POA: Diagnosis not present

## 2020-05-28 DIAGNOSIS — Z992 Dependence on renal dialysis: Secondary | ICD-10-CM | POA: Diagnosis not present

## 2020-05-28 DIAGNOSIS — E8770 Fluid overload, unspecified: Secondary | ICD-10-CM | POA: Diagnosis not present

## 2020-05-28 DIAGNOSIS — N186 End stage renal disease: Secondary | ICD-10-CM | POA: Diagnosis not present

## 2020-05-29 DIAGNOSIS — N2581 Secondary hyperparathyroidism of renal origin: Secondary | ICD-10-CM | POA: Diagnosis not present

## 2020-05-29 DIAGNOSIS — N186 End stage renal disease: Secondary | ICD-10-CM | POA: Diagnosis not present

## 2020-05-29 DIAGNOSIS — Z992 Dependence on renal dialysis: Secondary | ICD-10-CM | POA: Diagnosis not present

## 2020-05-29 DIAGNOSIS — D689 Coagulation defect, unspecified: Secondary | ICD-10-CM | POA: Diagnosis not present

## 2020-05-29 DIAGNOSIS — E8770 Fluid overload, unspecified: Secondary | ICD-10-CM | POA: Diagnosis not present

## 2020-05-29 DIAGNOSIS — L299 Pruritus, unspecified: Secondary | ICD-10-CM | POA: Diagnosis not present

## 2020-05-30 DIAGNOSIS — L299 Pruritus, unspecified: Secondary | ICD-10-CM | POA: Diagnosis not present

## 2020-05-30 DIAGNOSIS — D689 Coagulation defect, unspecified: Secondary | ICD-10-CM | POA: Diagnosis not present

## 2020-05-30 DIAGNOSIS — Z992 Dependence on renal dialysis: Secondary | ICD-10-CM | POA: Diagnosis not present

## 2020-05-30 DIAGNOSIS — N2581 Secondary hyperparathyroidism of renal origin: Secondary | ICD-10-CM | POA: Diagnosis not present

## 2020-05-30 DIAGNOSIS — N186 End stage renal disease: Secondary | ICD-10-CM | POA: Diagnosis not present

## 2020-05-30 DIAGNOSIS — E8770 Fluid overload, unspecified: Secondary | ICD-10-CM | POA: Diagnosis not present

## 2020-06-01 DIAGNOSIS — N2581 Secondary hyperparathyroidism of renal origin: Secondary | ICD-10-CM | POA: Diagnosis not present

## 2020-06-01 DIAGNOSIS — N186 End stage renal disease: Secondary | ICD-10-CM | POA: Diagnosis not present

## 2020-06-01 DIAGNOSIS — L299 Pruritus, unspecified: Secondary | ICD-10-CM | POA: Diagnosis not present

## 2020-06-01 DIAGNOSIS — D689 Coagulation defect, unspecified: Secondary | ICD-10-CM | POA: Diagnosis not present

## 2020-06-01 DIAGNOSIS — E8770 Fluid overload, unspecified: Secondary | ICD-10-CM | POA: Diagnosis not present

## 2020-06-01 DIAGNOSIS — Z992 Dependence on renal dialysis: Secondary | ICD-10-CM | POA: Diagnosis not present

## 2020-06-04 DIAGNOSIS — E8770 Fluid overload, unspecified: Secondary | ICD-10-CM | POA: Diagnosis not present

## 2020-06-04 DIAGNOSIS — N2581 Secondary hyperparathyroidism of renal origin: Secondary | ICD-10-CM | POA: Diagnosis not present

## 2020-06-04 DIAGNOSIS — Z992 Dependence on renal dialysis: Secondary | ICD-10-CM | POA: Diagnosis not present

## 2020-06-04 DIAGNOSIS — N186 End stage renal disease: Secondary | ICD-10-CM | POA: Diagnosis not present

## 2020-06-04 DIAGNOSIS — D689 Coagulation defect, unspecified: Secondary | ICD-10-CM | POA: Diagnosis not present

## 2020-06-04 DIAGNOSIS — L299 Pruritus, unspecified: Secondary | ICD-10-CM | POA: Diagnosis not present

## 2020-06-04 DIAGNOSIS — R52 Pain, unspecified: Secondary | ICD-10-CM | POA: Diagnosis not present

## 2020-06-05 DIAGNOSIS — E8770 Fluid overload, unspecified: Secondary | ICD-10-CM | POA: Diagnosis not present

## 2020-06-05 DIAGNOSIS — D689 Coagulation defect, unspecified: Secondary | ICD-10-CM | POA: Diagnosis not present

## 2020-06-05 DIAGNOSIS — R52 Pain, unspecified: Secondary | ICD-10-CM | POA: Diagnosis not present

## 2020-06-05 DIAGNOSIS — L299 Pruritus, unspecified: Secondary | ICD-10-CM | POA: Diagnosis not present

## 2020-06-05 DIAGNOSIS — Z992 Dependence on renal dialysis: Secondary | ICD-10-CM | POA: Diagnosis not present

## 2020-06-05 DIAGNOSIS — N186 End stage renal disease: Secondary | ICD-10-CM | POA: Diagnosis not present

## 2020-06-05 DIAGNOSIS — N2581 Secondary hyperparathyroidism of renal origin: Secondary | ICD-10-CM | POA: Diagnosis not present

## 2020-06-06 DIAGNOSIS — N186 End stage renal disease: Secondary | ICD-10-CM | POA: Diagnosis not present

## 2020-06-06 DIAGNOSIS — E8770 Fluid overload, unspecified: Secondary | ICD-10-CM | POA: Diagnosis not present

## 2020-06-06 DIAGNOSIS — D689 Coagulation defect, unspecified: Secondary | ICD-10-CM | POA: Diagnosis not present

## 2020-06-06 DIAGNOSIS — N2581 Secondary hyperparathyroidism of renal origin: Secondary | ICD-10-CM | POA: Diagnosis not present

## 2020-06-06 DIAGNOSIS — Z992 Dependence on renal dialysis: Secondary | ICD-10-CM | POA: Diagnosis not present

## 2020-06-06 DIAGNOSIS — R52 Pain, unspecified: Secondary | ICD-10-CM | POA: Diagnosis not present

## 2020-06-06 DIAGNOSIS — L299 Pruritus, unspecified: Secondary | ICD-10-CM | POA: Diagnosis not present

## 2020-06-07 ENCOUNTER — Telehealth: Payer: Self-pay

## 2020-06-08 DIAGNOSIS — L299 Pruritus, unspecified: Secondary | ICD-10-CM | POA: Diagnosis not present

## 2020-06-08 DIAGNOSIS — E8770 Fluid overload, unspecified: Secondary | ICD-10-CM | POA: Diagnosis not present

## 2020-06-08 DIAGNOSIS — N2581 Secondary hyperparathyroidism of renal origin: Secondary | ICD-10-CM | POA: Diagnosis not present

## 2020-06-08 DIAGNOSIS — N186 End stage renal disease: Secondary | ICD-10-CM | POA: Diagnosis not present

## 2020-06-08 DIAGNOSIS — R52 Pain, unspecified: Secondary | ICD-10-CM | POA: Diagnosis not present

## 2020-06-08 DIAGNOSIS — D689 Coagulation defect, unspecified: Secondary | ICD-10-CM | POA: Diagnosis not present

## 2020-06-08 DIAGNOSIS — Z992 Dependence on renal dialysis: Secondary | ICD-10-CM | POA: Diagnosis not present

## 2020-06-11 DIAGNOSIS — N2581 Secondary hyperparathyroidism of renal origin: Secondary | ICD-10-CM | POA: Diagnosis not present

## 2020-06-11 DIAGNOSIS — E8779 Other fluid overload: Secondary | ICD-10-CM | POA: Diagnosis not present

## 2020-06-11 DIAGNOSIS — D689 Coagulation defect, unspecified: Secondary | ICD-10-CM | POA: Diagnosis not present

## 2020-06-11 DIAGNOSIS — L299 Pruritus, unspecified: Secondary | ICD-10-CM | POA: Diagnosis not present

## 2020-06-11 DIAGNOSIS — N186 End stage renal disease: Secondary | ICD-10-CM | POA: Diagnosis not present

## 2020-06-11 DIAGNOSIS — Z992 Dependence on renal dialysis: Secondary | ICD-10-CM | POA: Diagnosis not present

## 2020-06-11 DIAGNOSIS — R52 Pain, unspecified: Secondary | ICD-10-CM | POA: Diagnosis not present

## 2020-06-11 DIAGNOSIS — D631 Anemia in chronic kidney disease: Secondary | ICD-10-CM | POA: Diagnosis not present

## 2020-06-12 DIAGNOSIS — D631 Anemia in chronic kidney disease: Secondary | ICD-10-CM | POA: Diagnosis not present

## 2020-06-12 DIAGNOSIS — L299 Pruritus, unspecified: Secondary | ICD-10-CM | POA: Diagnosis not present

## 2020-06-12 DIAGNOSIS — E8779 Other fluid overload: Secondary | ICD-10-CM | POA: Diagnosis not present

## 2020-06-12 DIAGNOSIS — Z992 Dependence on renal dialysis: Secondary | ICD-10-CM | POA: Diagnosis not present

## 2020-06-12 DIAGNOSIS — D689 Coagulation defect, unspecified: Secondary | ICD-10-CM | POA: Diagnosis not present

## 2020-06-12 DIAGNOSIS — N186 End stage renal disease: Secondary | ICD-10-CM | POA: Diagnosis not present

## 2020-06-12 DIAGNOSIS — N2581 Secondary hyperparathyroidism of renal origin: Secondary | ICD-10-CM | POA: Diagnosis not present

## 2020-06-12 DIAGNOSIS — R52 Pain, unspecified: Secondary | ICD-10-CM | POA: Diagnosis not present

## 2020-06-13 DIAGNOSIS — R52 Pain, unspecified: Secondary | ICD-10-CM | POA: Diagnosis not present

## 2020-06-13 DIAGNOSIS — I120 Hypertensive chronic kidney disease with stage 5 chronic kidney disease or end stage renal disease: Secondary | ICD-10-CM | POA: Diagnosis not present

## 2020-06-13 DIAGNOSIS — L299 Pruritus, unspecified: Secondary | ICD-10-CM | POA: Diagnosis not present

## 2020-06-13 DIAGNOSIS — Z992 Dependence on renal dialysis: Secondary | ICD-10-CM | POA: Diagnosis not present

## 2020-06-13 DIAGNOSIS — N186 End stage renal disease: Secondary | ICD-10-CM | POA: Diagnosis not present

## 2020-06-13 DIAGNOSIS — D631 Anemia in chronic kidney disease: Secondary | ICD-10-CM | POA: Diagnosis not present

## 2020-06-13 DIAGNOSIS — N2581 Secondary hyperparathyroidism of renal origin: Secondary | ICD-10-CM | POA: Diagnosis not present

## 2020-06-13 DIAGNOSIS — E8779 Other fluid overload: Secondary | ICD-10-CM | POA: Diagnosis not present

## 2020-06-13 DIAGNOSIS — D689 Coagulation defect, unspecified: Secondary | ICD-10-CM | POA: Diagnosis not present

## 2020-06-15 DIAGNOSIS — Z992 Dependence on renal dialysis: Secondary | ICD-10-CM | POA: Diagnosis not present

## 2020-06-15 DIAGNOSIS — D689 Coagulation defect, unspecified: Secondary | ICD-10-CM | POA: Diagnosis not present

## 2020-06-15 DIAGNOSIS — N186 End stage renal disease: Secondary | ICD-10-CM | POA: Diagnosis not present

## 2020-06-15 DIAGNOSIS — N2581 Secondary hyperparathyroidism of renal origin: Secondary | ICD-10-CM | POA: Diagnosis not present

## 2020-06-18 DIAGNOSIS — E8770 Fluid overload, unspecified: Secondary | ICD-10-CM | POA: Diagnosis not present

## 2020-06-18 DIAGNOSIS — R52 Pain, unspecified: Secondary | ICD-10-CM | POA: Diagnosis not present

## 2020-06-18 DIAGNOSIS — L299 Pruritus, unspecified: Secondary | ICD-10-CM | POA: Diagnosis not present

## 2020-06-18 DIAGNOSIS — Z992 Dependence on renal dialysis: Secondary | ICD-10-CM | POA: Diagnosis not present

## 2020-06-18 DIAGNOSIS — D689 Coagulation defect, unspecified: Secondary | ICD-10-CM | POA: Diagnosis not present

## 2020-06-18 DIAGNOSIS — N2581 Secondary hyperparathyroidism of renal origin: Secondary | ICD-10-CM | POA: Diagnosis not present

## 2020-06-18 DIAGNOSIS — E8779 Other fluid overload: Secondary | ICD-10-CM | POA: Diagnosis not present

## 2020-06-18 DIAGNOSIS — N186 End stage renal disease: Secondary | ICD-10-CM | POA: Diagnosis not present

## 2020-06-19 DIAGNOSIS — Z992 Dependence on renal dialysis: Secondary | ICD-10-CM | POA: Diagnosis not present

## 2020-06-19 DIAGNOSIS — L299 Pruritus, unspecified: Secondary | ICD-10-CM | POA: Diagnosis not present

## 2020-06-19 DIAGNOSIS — E8779 Other fluid overload: Secondary | ICD-10-CM | POA: Diagnosis not present

## 2020-06-19 DIAGNOSIS — R52 Pain, unspecified: Secondary | ICD-10-CM | POA: Diagnosis not present

## 2020-06-19 DIAGNOSIS — E8770 Fluid overload, unspecified: Secondary | ICD-10-CM | POA: Diagnosis not present

## 2020-06-19 DIAGNOSIS — D689 Coagulation defect, unspecified: Secondary | ICD-10-CM | POA: Diagnosis not present

## 2020-06-19 DIAGNOSIS — N2581 Secondary hyperparathyroidism of renal origin: Secondary | ICD-10-CM | POA: Diagnosis not present

## 2020-06-19 DIAGNOSIS — N186 End stage renal disease: Secondary | ICD-10-CM | POA: Diagnosis not present

## 2020-06-20 DIAGNOSIS — L299 Pruritus, unspecified: Secondary | ICD-10-CM | POA: Diagnosis not present

## 2020-06-20 DIAGNOSIS — N186 End stage renal disease: Secondary | ICD-10-CM | POA: Diagnosis not present

## 2020-06-20 DIAGNOSIS — E8779 Other fluid overload: Secondary | ICD-10-CM | POA: Diagnosis not present

## 2020-06-20 DIAGNOSIS — Z992 Dependence on renal dialysis: Secondary | ICD-10-CM | POA: Diagnosis not present

## 2020-06-20 DIAGNOSIS — E8770 Fluid overload, unspecified: Secondary | ICD-10-CM | POA: Diagnosis not present

## 2020-06-20 DIAGNOSIS — R52 Pain, unspecified: Secondary | ICD-10-CM | POA: Diagnosis not present

## 2020-06-20 DIAGNOSIS — D689 Coagulation defect, unspecified: Secondary | ICD-10-CM | POA: Diagnosis not present

## 2020-06-20 DIAGNOSIS — N2581 Secondary hyperparathyroidism of renal origin: Secondary | ICD-10-CM | POA: Diagnosis not present

## 2020-06-22 DIAGNOSIS — N2581 Secondary hyperparathyroidism of renal origin: Secondary | ICD-10-CM | POA: Diagnosis not present

## 2020-06-22 DIAGNOSIS — R52 Pain, unspecified: Secondary | ICD-10-CM | POA: Diagnosis not present

## 2020-06-22 DIAGNOSIS — N186 End stage renal disease: Secondary | ICD-10-CM | POA: Diagnosis not present

## 2020-06-22 DIAGNOSIS — E8779 Other fluid overload: Secondary | ICD-10-CM | POA: Diagnosis not present

## 2020-06-22 DIAGNOSIS — L299 Pruritus, unspecified: Secondary | ICD-10-CM | POA: Diagnosis not present

## 2020-06-22 DIAGNOSIS — D689 Coagulation defect, unspecified: Secondary | ICD-10-CM | POA: Diagnosis not present

## 2020-06-22 DIAGNOSIS — Z992 Dependence on renal dialysis: Secondary | ICD-10-CM | POA: Diagnosis not present

## 2020-06-22 DIAGNOSIS — E8770 Fluid overload, unspecified: Secondary | ICD-10-CM | POA: Diagnosis not present

## 2020-06-25 DIAGNOSIS — Z992 Dependence on renal dialysis: Secondary | ICD-10-CM | POA: Diagnosis not present

## 2020-06-25 DIAGNOSIS — N2581 Secondary hyperparathyroidism of renal origin: Secondary | ICD-10-CM | POA: Diagnosis not present

## 2020-06-25 DIAGNOSIS — D509 Iron deficiency anemia, unspecified: Secondary | ICD-10-CM | POA: Diagnosis not present

## 2020-06-25 DIAGNOSIS — D689 Coagulation defect, unspecified: Secondary | ICD-10-CM | POA: Diagnosis not present

## 2020-06-25 DIAGNOSIS — Z23 Encounter for immunization: Secondary | ICD-10-CM | POA: Diagnosis not present

## 2020-06-25 DIAGNOSIS — N186 End stage renal disease: Secondary | ICD-10-CM | POA: Diagnosis not present

## 2020-06-26 DIAGNOSIS — N186 End stage renal disease: Secondary | ICD-10-CM | POA: Diagnosis not present

## 2020-06-26 DIAGNOSIS — Z992 Dependence on renal dialysis: Secondary | ICD-10-CM | POA: Diagnosis not present

## 2020-06-26 DIAGNOSIS — D509 Iron deficiency anemia, unspecified: Secondary | ICD-10-CM | POA: Diagnosis not present

## 2020-06-26 DIAGNOSIS — D689 Coagulation defect, unspecified: Secondary | ICD-10-CM | POA: Diagnosis not present

## 2020-06-26 DIAGNOSIS — N2581 Secondary hyperparathyroidism of renal origin: Secondary | ICD-10-CM | POA: Diagnosis not present

## 2020-06-26 DIAGNOSIS — Z23 Encounter for immunization: Secondary | ICD-10-CM | POA: Diagnosis not present

## 2020-06-27 DIAGNOSIS — N186 End stage renal disease: Secondary | ICD-10-CM | POA: Diagnosis not present

## 2020-06-27 DIAGNOSIS — Z23 Encounter for immunization: Secondary | ICD-10-CM | POA: Diagnosis not present

## 2020-06-27 DIAGNOSIS — D509 Iron deficiency anemia, unspecified: Secondary | ICD-10-CM | POA: Diagnosis not present

## 2020-06-27 DIAGNOSIS — N2581 Secondary hyperparathyroidism of renal origin: Secondary | ICD-10-CM | POA: Diagnosis not present

## 2020-06-27 DIAGNOSIS — Z992 Dependence on renal dialysis: Secondary | ICD-10-CM | POA: Diagnosis not present

## 2020-06-27 DIAGNOSIS — D689 Coagulation defect, unspecified: Secondary | ICD-10-CM | POA: Diagnosis not present

## 2020-07-01 ENCOUNTER — Telehealth: Payer: Self-pay

## 2020-07-01 NOTE — Progress Notes (Addendum)
Chronic Care Management Pharmacy Assistant   Name: Adam Howell  MRN: [id number] DOB: [birth year]  Reason for Encounter: BP/DM Disease State / CPP Reschedule Call  Recent office visits:  04/08/20- Aura Dials, NP- Vasculitic ulcer of lower extremity, short course hydrocodone- acetaminophen 5-325 mg prn x 5 days, follow up 2 months  02/25/20-Vasculitic ulcer of lower extremity, short course hydrocodone- acetaminophen 5-325 mg prn x 5 days, follow up 6 weeks for wound check  02/02/20- Vasculitic ulcer of lower extremity, started lidocaine 5% cream daily application, short course hydrocodone- acetaminophen 5-325 mg prn x 5 days, referral to wound care and vascular 01/22/20- Aura Dials, NP-chronic conditions addressed, started  triamcinolone acetonide 0.1% twice daily for skin lesions,  referral to dermatology,   Recent consult visits:  12/29/19- Gala Lewandowsky, MD (Podiatry)- Pain due to onychomycosis of toenails of both feet, started cyclobenzaprine 10 mg tid prn , follow up 3 months   Hospital visits:  None in previous 6 months  Medications: Outpatient Encounter Medications as of 07/01/2020  Medication Sig   amiodarone (PACERONE) 200 MG tablet Take 200 mg by mouth 2 (two) times daily.   Blood Glucose Monitoring Suppl (ONETOUCH VERIO) w/Device KIT Use to check blood sugar 4 times a day.   Blood Pressure Monitoring (BLOOD PRESSURE MONITOR AUTOMAT) DEVI Use to check blood pressure twice daily.   bumetanide (BUMEX) 1 MG tablet Take 1 mg by mouth 2 (two) times daily.   carvedilol (COREG) 3.125 MG tablet Take 3.125 mg by mouth 2 (two) times daily.   cyclobenzaprine (FLEXERIL) 10 MG tablet Take 1 tablet (10 mg total) by mouth 3 (three) times daily as needed for muscle spasms.   diclofenac Sodium (VOLTAREN) 1 % GEL Apply 2 g topically 4 (four) times daily as needed (leg pain).   ELIQUIS 2.5 MG TABS tablet Take 2.5 mg by mouth 2 (two) times daily. Sig: Daily as presciribed.    furosemide (LASIX) 80 MG tablet Take 80 mg by mouth 2 (two) times daily.   gabapentin (NEURONTIN) 300 MG capsule Take 300 mg by mouth 2 (two) times daily.    glucose blood (ONETOUCH VERIO) test strip Use to check blood sugar 4 times a day.   isosorbide mononitrate (IMDUR) 30 MG 24 hr tablet Take 30 mg by mouth daily.   Lancets (ONETOUCH ULTRASOFT) lancets Use to check blood sugar 4 times a day.   LEVEMIR FLEXTOUCH 100 UNIT/ML FlexPen Inject 20 Units into the skin daily. Patient is prescribed 20 units daily. Does not take with regularity per Daughter   Lidocaine 5 % CREA Apply 1 application topically daily.   losartan (COZAAR) 100 MG tablet Take 100 mg by mouth daily.   Methoxy PEG-Epoetin Beta (MIRCERA IJ) Mircera   NIFEdipine (PROCARDIA-XL/NIFEDICAL-XL) 30 MG 24 hr tablet Take 30 mg by mouth daily.   rosuvastatin (CRESTOR) 40 MG tablet Take 40 mg by mouth daily.   sevelamer carbonate (RENVELA) 800 MG tablet Take 1,600 mg by mouth 3 (three) times daily.   traZODone (DESYREL) 50 MG tablet Take 1 tablet (50 mg total) by mouth at bedtime as needed for sleep.   triamcinolone cream (KENALOG) 0.1 % Apply 1 application topically 2 (two) times daily.   No facility-administered encounter medications on file as of 07/01/2020.   Reviewed chart prior to disease state call. Spoke with patient regarding BP  Recent Office Vitals: BP Readings from Last 3 Encounters:  04/08/20 138/86  02/26/20 (!) 147/80  02/02/20 (!) 169/93   Pulse  Readings from Last 3 Encounters:  04/08/20 92  02/26/20 87  02/02/20 99    Wt Readings from Last 3 Encounters:  04/08/20 296 lb 12.8 oz (134.6 kg)  02/26/20 (!) 304 lb (137.9 kg)  02/02/20 293 lb 3.2 oz (133 kg)     Kidney Function Lab Results  Component Value Date/Time   CREATININE CANCELED 01/22/2020 09:25 AM    BMP Latest Ref Rng & Units 01/22/2020  Glucose mg/dL CANCELED  BUN - CANCELED  Creatinine - CANCELED  Sodium - CANCELED  Potassium - CANCELED   Chloride - CANCELED  CO2 - CANCELED  Calcium - CANCELED   Unsuccessful attempts to reach patient   Current antihypertensive regimen:  Amiodarone 200mg  bid Eliquis 2.5mg  qhs twice daily  Bumetanide 1mg  bid Carvedilol 3.125mg  bid Furosemide 80 mg bid Isosorbide Mononitrate 30 mg  Losartan 100 mg qd Nifedipine XL 30 mg qd  Current antihyperglycemic regimen:  Levemir 20 units daily  What recent interventions/DTPs have been made to improve glycemic control:      How often are you checking your blood sugar?   What are your blood sugars ranging?  Fasting:  Before meals:  After meals:  Bedtime:   During the week, how often does your blood glucose drop below 70?  Are you checking your feet daily/regularly?   Adherence Review: Is the patient currently on a STATIN medication? Yes Is the patient currently on ACE/ARB medication? Yes  Does the patient have >5 day gap between last estimated fill dates?  Amiodarone 200mg  bid- 90 DS last filled 09/02/19 Eliquis 2.5mg  qhs twice daily- 90 DS last filled  09/12/19 Bumetanide 1mg  bid- 30 DS last filled 06/21 Carvedilol 3.125mg  bid Furosemide 80 mg bid Isosorbide Mononitrate 30 mg  Losartan 100 mg qd Nifedipine XL 30 mg qd Levemir 20 units daily  Star Rating Drugs:  Rosuvastatin 40 mg  Losartan 100 mg   [employer] CPA, CMA  Patient has moved out of state and unenrolled from [employer].

## 2020-07-02 DIAGNOSIS — N186 End stage renal disease: Secondary | ICD-10-CM | POA: Diagnosis not present

## 2020-07-02 DIAGNOSIS — Z23 Encounter for immunization: Secondary | ICD-10-CM | POA: Diagnosis not present

## 2020-07-02 DIAGNOSIS — Z992 Dependence on renal dialysis: Secondary | ICD-10-CM | POA: Diagnosis not present

## 2020-07-02 DIAGNOSIS — D631 Anemia in chronic kidney disease: Secondary | ICD-10-CM | POA: Diagnosis not present

## 2020-07-02 DIAGNOSIS — D689 Coagulation defect, unspecified: Secondary | ICD-10-CM | POA: Diagnosis not present

## 2020-07-02 DIAGNOSIS — N2581 Secondary hyperparathyroidism of renal origin: Secondary | ICD-10-CM | POA: Diagnosis not present

## 2020-07-02 DIAGNOSIS — N250 Renal osteodystrophy: Secondary | ICD-10-CM | POA: Diagnosis not present

## 2020-07-03 ENCOUNTER — Ambulatory Visit: Payer: Medicare Other | Admitting: Dermatology

## 2020-07-04 DIAGNOSIS — N2581 Secondary hyperparathyroidism of renal origin: Secondary | ICD-10-CM | POA: Diagnosis not present

## 2020-07-04 DIAGNOSIS — D631 Anemia in chronic kidney disease: Secondary | ICD-10-CM | POA: Diagnosis not present

## 2020-07-04 DIAGNOSIS — Z23 Encounter for immunization: Secondary | ICD-10-CM | POA: Diagnosis not present

## 2020-07-04 DIAGNOSIS — D689 Coagulation defect, unspecified: Secondary | ICD-10-CM | POA: Diagnosis not present

## 2020-07-04 DIAGNOSIS — Z992 Dependence on renal dialysis: Secondary | ICD-10-CM | POA: Diagnosis not present

## 2020-07-04 DIAGNOSIS — N186 End stage renal disease: Secondary | ICD-10-CM | POA: Diagnosis not present

## 2020-07-04 DIAGNOSIS — N250 Renal osteodystrophy: Secondary | ICD-10-CM | POA: Diagnosis not present

## 2020-07-06 DIAGNOSIS — N186 End stage renal disease: Secondary | ICD-10-CM | POA: Diagnosis not present

## 2020-07-06 DIAGNOSIS — Z23 Encounter for immunization: Secondary | ICD-10-CM | POA: Diagnosis not present

## 2020-07-06 DIAGNOSIS — D631 Anemia in chronic kidney disease: Secondary | ICD-10-CM | POA: Diagnosis not present

## 2020-07-06 DIAGNOSIS — N250 Renal osteodystrophy: Secondary | ICD-10-CM | POA: Diagnosis not present

## 2020-07-06 DIAGNOSIS — N2581 Secondary hyperparathyroidism of renal origin: Secondary | ICD-10-CM | POA: Diagnosis not present

## 2020-07-06 DIAGNOSIS — D689 Coagulation defect, unspecified: Secondary | ICD-10-CM | POA: Diagnosis not present

## 2020-07-06 DIAGNOSIS — Z992 Dependence on renal dialysis: Secondary | ICD-10-CM | POA: Diagnosis not present

## 2020-07-10 ENCOUNTER — Telehealth: Payer: Self-pay | Admitting: General Practice

## 2020-07-10 ENCOUNTER — Telehealth: Payer: Self-pay

## 2020-07-10 NOTE — Telephone Encounter (Signed)
  Chronic Care Management   Outreach Note  07/10/2020 Name: Nadia Pizzoferrato MRN: [id number] DOB: 1961-03-16  Referred by: Venita Lick, NP Reason for referral : Appointment (RNCM: Follow up for Chronic Disease Management and Care Coordination Needs)   An unsuccessful telephone outreach was attempted today. The patient was referred to the case management team for assistance with care management and care coordination.   Follow Up Plan: A HIPAA compliant phone message was left for the patient providing contact information and requesting a return call.   Noreene Larsson RN, MSN, [facility] Mobile: [phone]

## 2020-07-11 DIAGNOSIS — Z23 Encounter for immunization: Secondary | ICD-10-CM | POA: Diagnosis not present

## 2020-07-11 DIAGNOSIS — N2581 Secondary hyperparathyroidism of renal origin: Secondary | ICD-10-CM | POA: Diagnosis not present

## 2020-07-11 DIAGNOSIS — N186 End stage renal disease: Secondary | ICD-10-CM | POA: Diagnosis not present

## 2020-07-11 DIAGNOSIS — D631 Anemia in chronic kidney disease: Secondary | ICD-10-CM | POA: Diagnosis not present

## 2020-07-11 DIAGNOSIS — Z992 Dependence on renal dialysis: Secondary | ICD-10-CM | POA: Diagnosis not present

## 2020-07-11 DIAGNOSIS — D689 Coagulation defect, unspecified: Secondary | ICD-10-CM | POA: Diagnosis not present

## 2020-07-12 ENCOUNTER — Telehealth: Payer: Self-pay

## 2020-07-12 NOTE — Chronic Care Management (AMB) (Signed)
  Care Management   Note  07/12/2020 Name: Adam Howell MRN: [id number] DOB: Feb 21, 1961  Adam Howell is a 60 y.o. year old male who is a primary care patient of Venita Lick, NP and is actively engaged with the care management team. I reached out to [name] by phone today to assist with re-scheduling a follow up visit with the RN Case Manager  Follow up plan: Unsuccessful telephone outreach attempt made. A HIPAA compliant phone message was left for the patient providing contact information and requesting a return call.  The care management team will reach out to the patient again over the next 7 days.  If patient returns call to provider office, please advise to call [facility]  at [place], [place], [place],  [address] Direct Dial: [phone] [email or web address] Website: [email or web address]

## 2020-07-13 DIAGNOSIS — Z23 Encounter for immunization: Secondary | ICD-10-CM | POA: Diagnosis not present

## 2020-07-13 DIAGNOSIS — D631 Anemia in chronic kidney disease: Secondary | ICD-10-CM | POA: Diagnosis not present

## 2020-07-13 DIAGNOSIS — I120 Hypertensive chronic kidney disease with stage 5 chronic kidney disease or end stage renal disease: Secondary | ICD-10-CM | POA: Diagnosis not present

## 2020-07-13 DIAGNOSIS — Z992 Dependence on renal dialysis: Secondary | ICD-10-CM | POA: Diagnosis not present

## 2020-07-13 DIAGNOSIS — N2581 Secondary hyperparathyroidism of renal origin: Secondary | ICD-10-CM | POA: Diagnosis not present

## 2020-07-13 DIAGNOSIS — D689 Coagulation defect, unspecified: Secondary | ICD-10-CM | POA: Diagnosis not present

## 2020-07-13 DIAGNOSIS — N186 End stage renal disease: Secondary | ICD-10-CM | POA: Diagnosis not present

## 2020-07-17 ENCOUNTER — Telehealth: Payer: Self-pay

## 2020-07-17 NOTE — Chronic Care Management (AMB) (Signed)
  Care Management   Note  07/17/2020 Name: Adam Howell MRN: [id number] DOB: 1960/05/15  Adam Howell is a 60 y.o. year old male who is a primary care patient of Venita Lick, NP and is actively engaged with the care management team. I reached out to [name] by phone today to assist with re-scheduling a follow up visit with the RN Case Manager  Follow up plan: Patient declines further follow up and engagement by the care management team. Patient states that he has moved out of state. Appropriate care team members and provider have been notified via electronic communication.   Noreene Larsson, [place], [place], [place] [address] Direct Dial: [phone] [email or web address] Website: [email or web address]

## 2020-07-17 NOTE — Telephone Encounter (Signed)
Patient states that he has moved to [place]

## 2021-03-26 ENCOUNTER — Ambulatory Visit: Payer: Self-pay | Admitting: *Deleted

## 2021-03-26 NOTE — Telephone Encounter (Signed)
°  Chief Complaint: Feet, ankle swelling Symptoms: States "Twice the size as normal, both feet to ankles Frequency: onset yesterday Pertinent Negatives: Patient denies redness, warmth Disposition: [] ED /[x] Urgent Care (no appt availability in office) / [] Appointment(In office/virtual)/ []  [facility]/ [] Home Care/ [] Refused Recommended Disposition /[] [facility]/ []  Follow-up with PCP Additional Notes: 10/10 pain, constant. Has been out of all meds for 1 week "Lost on bus." Heart meds, pain, sleep and insulin, has not taken in 1 week.  After hours call       Reason for Disposition  SEVERE leg swelling (e.g., swelling extends above knee, entire leg is swollen, weeping fluid)    Feet and ankles only but with 10/10 pain  Answer Assessment - Initial Assessment Questions 1. ONSET: "When did the swelling start?" (e.g., minutes, hours, days)     Yesterday 2. LOCATION: "What part of the leg is swollen?"  "Are both legs swollen or just one leg?"     Feet to ankles 3. SEVERITY: "How bad is the swelling?" (e.g., localized; mild, moderate, severe)  - Localized - small area of swelling localized to one leg  - MILD pedal edema - swelling limited to foot and ankle, pitting edema < 1/4 inch (6 mm) deep, rest and elevation eliminate most or all swelling  - MODERATE edema - swelling of lower leg to knee, pitting edema > 1/4 inch (6 mm) deep, rest and elevation only partially reduce swelling  - SEVERE edema - swelling extends above knee, facial or hand swelling present      Twice the size 4. REDNESS: "Does the swelling look red or infected?"     No 5. PAIN: "Is the swelling painful to touch?" If Yes, ask: "How painful is it?"   (Scale 1-10; mild, moderate or severe)    Yes, 10/10 6. FEVER: "Do you have a fever?" If Yes, ask: "What is it, how was it measured, and when did it start?"      no 7. CAUSE: "What do you think is causing the leg swelling?"     Fluid. Out of heart med,  insulin, pain med,sleep med 8. MEDICAL HISTORY: "Do you have a history of heart failure, kidney disease, liver failure, or cancer?"      9. RECURRENT SYMPTOM: "Have you had leg swelling before?" If Yes, ask: "When was the last time?" "What happened that time?"     No 10. OTHER SYMPTOMS: "Do you have any other symptoms?" (e.g., chest pain, difficulty breathing)       no  Protocols used: Leg Swelling and Edema-A-AH

## 2021-03-26 NOTE — Telephone Encounter (Signed)
Pt called reporting that he has swelling in his feet because he has been out of his medication. Please advise, seeking appt   [phone]   Attempted to reach pt, left VM to call back to discuss symptoms.

## 2021-03-27 ENCOUNTER — Telehealth: Payer: Self-pay | Admitting: Nurse Practitioner

## 2021-03-27 MED ORDER — ONETOUCH ULTRASOFT LANCETS MISC: 12 refills | Status: DC

## 2021-03-27 NOTE — Telephone Encounter (Signed)
Pt called to also request zolpidem (AMBIEN) 10 MG tablet  same pharmacy

## 2021-03-27 NOTE — Telephone Encounter (Signed)
Ambien request not on current medication list Last refill: 08/27/19

## 2021-03-27 NOTE — Telephone Encounter (Signed)
Medication: HYDROcodone-acetaminophen (NORCO) 5-325 MG tablet [[id number]]  ENDED,   Has the patient contacted their pharmacy? Yes advised to call the office (Agent: If no, request that the patient contact the pharmacy for the refill. If patient does not wish to contact the pharmacy document the reason why and proceed with request.) (Agent: If yes, when and what did the pharmacy advise?) [facility] [address] ([address]), [place] - [place]) [place] [address] Phone: [phone] Fax: [phone] Hours: Not open 24 hours   Preferred Pharmacy (with phone number or street name): /17/ Has the patient been seen for an appointment in the last year OR does the patient have an upcoming appointment? YES 04/01/21  Agent: Please be advised that RX refills may take up to 3 business days. We ask that you follow-up with your pharmacy.

## 2021-03-27 NOTE — Telephone Encounter (Signed)
Noted  

## 2021-03-27 NOTE — Telephone Encounter (Signed)
Medication Refill - Medication:Lancets (ONETOUCH ULTRASOFT, patient also asking for ambien that's not showing on his med list  Has the patient contacted their pharmacy? yes (Agent: If no, request that the patient contact the pharmacy for the refill. If patient does not wish to contact the pharmacy document the reason why and proceed with request.) (Agent: If yes, when and what did the pharmacy advise?)contact pcp  Preferred Pharmacy (with phone number or street name): Has the patient been seen for an appointment in the last year OR does the patient have an upcoming appointment? yes  Agent: Please be advised that RX refills may take up to 3 business days. We ask that you follow-up with your pharmacy.

## 2021-03-28 ENCOUNTER — Telehealth: Payer: Self-pay | Admitting: Nurse Practitioner

## 2021-03-28 NOTE — Telephone Encounter (Signed)
Copied from [place] [id number]. Topic: Quick Communication - Rx Refill/Question >> Mar 28, 2021  8:43 AM Tessa Lerner A wrote: Medication: HYDROcodone-acetaminophen (NORCO) 5-325 MG tablet [[id number]]   zolpidem (AMBIEN) 10 MG tablet [[id number]]   Has the patient contacted their pharmacy? Yes.  The patient has been directed to contact their PCP (Agent: If no, request that the patient contact the pharmacy for the refill. If patient does not wish to contact the pharmacy document the reason why and proceed with request.) (Agent: If yes, when and what did the pharmacy advise?)  Preferred Pharmacy (with phone number or street name): [facility] (N), [place] [address] [place] ([place]) [place] [address] Phone: [phone] Fax: [phone]   Has the patient been seen for an appointment in the last year OR does the patient have an upcoming appointment? Yes.    Agent: Please be advised that RX refills may take up to 3 business days. We ask that you follow-up with your pharmacy.

## 2021-03-28 NOTE — Telephone Encounter (Signed)
Left a message for patient to give our office a call back to discuss Jolene's recommendations.

## 2021-03-28 NOTE — Telephone Encounter (Signed)
Patient called and advised of the below message from [place], NP noted on 03/27/21 in the encounter, since this is the 2nd request for Hydrocodone. Patient verbalized understanding to discuss at upcoming appointment on 04/01/21 with Dr. Neomia Dear.    Venita Lick, NP to [employer], CMA      4:56 PM Lancets sent in, but Ambien and Norco will not be filled -- not on current medication list and has not been seen in office in one year, will need visit to discuss these and appropriate treatment regimen.  We do not perform chronic pain management in office and can further discuss this at visit.

## 2021-03-28 NOTE — Telephone Encounter (Signed)
Copied from [place] [phone]. Topic: Referral - Request for Referral >> Mar 28, 2021  8:46 AM Tessa Lerner A wrote: Has patient seen PCP for this complaint? No. *If NO, is insurance requiring patient see PCP for this issue before PCP can refer them? Referral for which specialty: Home Health  Preferred provider/office: Patient has no preference  Reason for referral: Wound Care

## 2021-03-28 NOTE — Telephone Encounter (Signed)
Attempted to contact patient with contact information listed in chart and was informed contact information listed was no longer his contact information.

## 2021-04-01 ENCOUNTER — Ambulatory Visit: Payer: Medicare Other | Admitting: Internal Medicine

## 2021-04-03 ENCOUNTER — Ambulatory Visit: Payer: Medicare Other | Admitting: Nurse Practitioner

## 2021-04-03 DIAGNOSIS — D6869 Other thrombophilia: Secondary | ICD-10-CM

## 2021-04-03 DIAGNOSIS — N400 Enlarged prostate without lower urinary tract symptoms: Secondary | ICD-10-CM

## 2021-04-03 DIAGNOSIS — I480 Paroxysmal atrial fibrillation: Secondary | ICD-10-CM

## 2021-04-03 DIAGNOSIS — N185 Chronic kidney disease, stage 5: Secondary | ICD-10-CM

## 2021-04-03 DIAGNOSIS — N186 End stage renal disease: Secondary | ICD-10-CM

## 2021-04-03 DIAGNOSIS — E1122 Type 2 diabetes mellitus with diabetic chronic kidney disease: Secondary | ICD-10-CM

## 2021-04-03 DIAGNOSIS — N2581 Secondary hyperparathyroidism of renal origin: Secondary | ICD-10-CM

## 2021-04-03 DIAGNOSIS — I5020 Unspecified systolic (congestive) heart failure: Secondary | ICD-10-CM

## 2021-04-03 DIAGNOSIS — E1169 Type 2 diabetes mellitus with other specified complication: Secondary | ICD-10-CM

## 2021-04-03 DIAGNOSIS — D631 Anemia in chronic kidney disease: Secondary | ICD-10-CM

## 2021-04-03 DIAGNOSIS — Z1159 Encounter for screening for other viral diseases: Secondary | ICD-10-CM

## 2021-04-03 DIAGNOSIS — Z114 Encounter for screening for human immunodeficiency virus [HIV]: Secondary | ICD-10-CM

## 2021-04-03 DIAGNOSIS — E538 Deficiency of other specified B group vitamins: Secondary | ICD-10-CM

## 2021-04-03 NOTE — Telephone Encounter (Signed)
Noted  

## 2021-04-10 ENCOUNTER — Ambulatory Visit (INDEPENDENT_AMBULATORY_CARE_PROVIDER_SITE_OTHER): Payer: Medicare Other | Admitting: Nurse Practitioner

## 2021-04-10 ENCOUNTER — Other Ambulatory Visit: Payer: Self-pay

## 2021-04-10 ENCOUNTER — Encounter: Payer: Self-pay | Admitting: Nurse Practitioner

## 2021-04-10 VITALS — BP 140/86 | HR 99 | Temp 98.6°F | Ht 75.0 in | Wt 298.4 lb

## 2021-04-10 DIAGNOSIS — E538 Deficiency of other specified B group vitamins: Secondary | ICD-10-CM

## 2021-04-10 DIAGNOSIS — N185 Chronic kidney disease, stage 5: Secondary | ICD-10-CM

## 2021-04-10 DIAGNOSIS — D631 Anemia in chronic kidney disease: Secondary | ICD-10-CM

## 2021-04-10 DIAGNOSIS — Z1159 Encounter for screening for other viral diseases: Secondary | ICD-10-CM

## 2021-04-10 DIAGNOSIS — Z79899 Other long term (current) drug therapy: Secondary | ICD-10-CM

## 2021-04-10 DIAGNOSIS — E1169 Type 2 diabetes mellitus with other specified complication: Secondary | ICD-10-CM | POA: Diagnosis not present

## 2021-04-10 DIAGNOSIS — R051 Acute cough: Secondary | ICD-10-CM

## 2021-04-10 DIAGNOSIS — E1122 Type 2 diabetes mellitus with diabetic chronic kidney disease: Secondary | ICD-10-CM | POA: Diagnosis not present

## 2021-04-10 DIAGNOSIS — L97909 Non-pressure chronic ulcer of unspecified part of unspecified lower leg with unspecified severity: Secondary | ICD-10-CM | POA: Diagnosis not present

## 2021-04-10 DIAGNOSIS — I132 Hypertensive heart and chronic kidney disease with heart failure and with stage 5 chronic kidney disease, or end stage renal disease: Secondary | ICD-10-CM

## 2021-04-10 DIAGNOSIS — I2510 Atherosclerotic heart disease of native coronary artery without angina pectoris: Secondary | ICD-10-CM

## 2021-04-10 DIAGNOSIS — Z992 Dependence on renal dialysis: Secondary | ICD-10-CM

## 2021-04-10 DIAGNOSIS — N2581 Secondary hyperparathyroidism of renal origin: Secondary | ICD-10-CM

## 2021-04-10 DIAGNOSIS — F5101 Primary insomnia: Secondary | ICD-10-CM

## 2021-04-10 DIAGNOSIS — I5020 Unspecified systolic (congestive) heart failure: Secondary | ICD-10-CM

## 2021-04-10 DIAGNOSIS — D6869 Other thrombophilia: Secondary | ICD-10-CM

## 2021-04-10 DIAGNOSIS — E785 Hyperlipidemia, unspecified: Secondary | ICD-10-CM

## 2021-04-10 DIAGNOSIS — Z114 Encounter for screening for human immunodeficiency virus [HIV]: Secondary | ICD-10-CM

## 2021-04-10 DIAGNOSIS — I480 Paroxysmal atrial fibrillation: Secondary | ICD-10-CM

## 2021-04-10 DIAGNOSIS — N400 Enlarged prostate without lower urinary tract symptoms: Secondary | ICD-10-CM

## 2021-04-10 DIAGNOSIS — N186 End stage renal disease: Secondary | ICD-10-CM

## 2021-04-10 DIAGNOSIS — G894 Chronic pain syndrome: Secondary | ICD-10-CM

## 2021-04-10 LAB — BAYER DCA HB A1C WAIVED: HB A1C (BAYER DCA - WAIVED): 5.9 % — ABNORMAL HIGH (ref 4.8–5.6)

## 2021-04-10 MED ORDER — FUROSEMIDE 80 MG PO TABS: 80.0000 mg | ORAL_TABLET | Freq: Two times a day (BID) | ORAL | 4 refills | Status: DC

## 2021-04-10 MED ORDER — ALBUTEROL SULFATE HFA 108 (90 BASE) MCG/ACT IN AERS: 2.0000 | INHALATION_SPRAY | Freq: Four times a day (QID) | RESPIRATORY_TRACT | 3 refills | Status: DC | PRN

## 2021-04-10 MED ORDER — MULTIVITAMINS PO CAPS: 1.0000 | ORAL_CAPSULE | Freq: Every day | ORAL | 3 refills | Status: DC

## 2021-04-10 MED ORDER — ELIQUIS 2.5 MG PO TABS
2.5000 mg | ORAL_TABLET | Freq: Two times a day (BID) | ORAL | 4 refills | Status: DC
Start: 2021-04-10 — End: 2022-04-02

## 2021-04-10 MED ORDER — ROSUVASTATIN CALCIUM 40 MG PO TABS: 40.0000 mg | ORAL_TABLET | Freq: Every day | ORAL | 4 refills | Status: DC

## 2021-04-10 MED ORDER — ONETOUCH VERIO W/DEVICE KIT: PACK | 0 refills | Status: DC

## 2021-04-10 MED ORDER — AMIODARONE HCL 200 MG PO TABS
200.0000 mg | ORAL_TABLET | Freq: Two times a day (BID) | ORAL | 4 refills | Status: DC
Start: 2021-04-10 — End: 2022-04-02

## 2021-04-10 MED ORDER — CARVEDILOL 3.125 MG PO TABS
3.1250 mg | ORAL_TABLET | Freq: Two times a day (BID) | ORAL | 4 refills | Status: DC
Start: 2021-04-10 — End: 2022-03-19

## 2021-04-10 MED ORDER — ONETOUCH ULTRASOFT LANCETS MISC
12 refills | Status: DC
Start: 2021-04-10 — End: 2022-01-13

## 2021-04-10 MED ORDER — GABAPENTIN 300 MG PO CAPS: 300.0000 mg | ORAL_CAPSULE | Freq: Two times a day (BID) | ORAL | 4 refills | Status: DC

## 2021-04-10 MED ORDER — MAGNESIUM CHLORIDE 64 MG PO TBEC: 1.0000 | DELAYED_RELEASE_TABLET | Freq: Every day | ORAL | 2 refills | Status: DC

## 2021-04-10 MED ORDER — NIFEDIPINE ER OSMOTIC RELEASE 30 MG PO TB24: 30.0000 mg | ORAL_TABLET | Freq: Every day | ORAL | 4 refills | Status: DC

## 2021-04-10 MED ORDER — ONETOUCH VERIO VI STRP: ORAL_STRIP | 12 refills | Status: DC

## 2021-04-10 MED ORDER — INSULIN PEN NEEDLE 30G X 8 MM MISC: 5 refills | Status: DC

## 2021-04-10 MED ORDER — BUMETANIDE 1 MG PO TABS
1.0000 mg | ORAL_TABLET | Freq: Two times a day (BID) | ORAL | 4 refills | Status: DC
Start: 2021-04-10 — End: 2022-10-12

## 2021-04-10 MED ORDER — HYDROCODONE-ACETAMINOPHEN 10-325 MG PO TABS: 1.0000 | ORAL_TABLET | Freq: Four times a day (QID) | ORAL | 0 refills | Status: DC | PRN

## 2021-04-10 MED ORDER — BENZONATATE 100 MG PO CAPS: 100.0000 mg | ORAL_CAPSULE | Freq: Three times a day (TID) | ORAL | 0 refills | Status: DC | PRN

## 2021-04-10 MED ORDER — ISOSORBIDE MONONITRATE ER 30 MG PO TB24: 30.0000 mg | ORAL_TABLET | Freq: Every day | ORAL | 4 refills | Status: DC

## 2021-04-10 MED ORDER — CYCLOBENZAPRINE HCL 10 MG PO TABS: 10.0000 mg | ORAL_TABLET | Freq: Three times a day (TID) | ORAL | 0 refills | Status: DC | PRN

## 2021-04-10 MED ORDER — ZOLPIDEM TARTRATE 10 MG PO TABS: 10.0000 mg | ORAL_TABLET | Freq: Every evening | ORAL | 2 refills | Status: DC | PRN

## 2021-04-10 MED ORDER — GUAIFENESIN ER 600 MG PO TB12: 600.0000 mg | ORAL_TABLET | Freq: Two times a day (BID) | ORAL | 1 refills | Status: DC | PRN

## 2021-04-10 MED ORDER — LOSARTAN POTASSIUM 100 MG PO TABS
100.0000 mg | ORAL_TABLET | Freq: Every day | ORAL | 4 refills | Status: DC
Start: 2021-04-10 — End: 2022-01-19

## 2021-04-10 MED ORDER — LEVEMIR FLEXTOUCH 100 UNIT/ML ~~LOC~~ SOPN: 20.0000 [IU] | PEN_INJECTOR | Freq: Every day | SUBCUTANEOUS | 4 refills | Status: DC

## 2021-04-10 MED ORDER — SEVELAMER CARBONATE 800 MG PO TABS: 1600.0000 mg | ORAL_TABLET | Freq: Three times a day (TID) | ORAL | 4 refills | Status: DC

## 2021-04-10 MED ORDER — TRAZODONE HCL 50 MG PO TABS: 50.0000 mg | ORAL_TABLET | Freq: Every evening | ORAL | 4 refills | Status: DC | PRN

## 2021-04-10 NOTE — Assessment & Plan Note (Signed)
Ongoing, reports taking Norco only after dialysis due to discomfort.  Discussed office policy for no controlled substances on initial visit.  Requested he sign release form for previous records to review, as not available on Epic.  Controlled substance contract today, unable to void due to dialysis, will consider pain management referral if frequent use of Norco.  Return in 4 weeks.

## 2021-04-10 NOTE — Assessment & Plan Note (Signed)
Chronic, ongoing with ESRD.  Continue collaboration with dialysis team and nephrology, attempt to obtain recent notes from [facility]. 

## 2021-04-10 NOTE — Assessment & Plan Note (Addendum)
Chronic, ongoing, followed by dialysis teams.  Continue current medication regimen as prescribed by them.  Attempt to obtain recent notes from [place].  Obtain CBC today.   Return in 3 months.

## 2021-04-10 NOTE — Assessment & Plan Note (Signed)
Chronic, stable, euvolemic today.  Continue current medication regimen and adjust as needed.  Continue collaboration with cardiology and attempt to obtain previous PCP records. Have highly recommend he schedule follow-up with cardiology, has missed due to his return to [place] for a period. Recommend: - Reminded to call for an overnight weight gain of >2 pounds or a weekly weight weight of >5 pounds - not adding salt to his food and has been reading food labels. Reviewed the importance of keeping daily sodium intake to 2000mg  daily  - Avoid NSAIDS
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
## 2021-04-10 NOTE — Assessment & Plan Note (Signed)
Signed today and reviewed with patient.  04/10/21.

## 2021-04-10 NOTE — Assessment & Plan Note (Signed)
Chronic, ongoing.  Continue current medication regimen and adjust as needed. Lipid panel today. 

## 2021-04-10 NOTE — Assessment & Plan Note (Signed)
Present x one week with no other symptoms per his report.  Will obtain CXR to ensure not heart related and no PNA.  At this time recommend OTC medications -- may take Claritin every 48 hours (he is a dialysis patient) as needed.  May use OTC Mucinex as needed, recommend minimal use with his dialysis -- only if needed.  Continue use of Halls.  Sent in Albuterol inhaler and Tessalon. If worsening will consider abx, dependent on imaging as well.

## 2021-04-10 NOTE — Assessment & Plan Note (Signed)
Chronic, ongoing with A1C 5.9% today.  At goal.  Continue current medication regimen and adjust as needed.  Recommend he monitor BS at least a couple times a day and document for provider + focus on diabetic diet.  CCM referral is in place.  Return to office in 3 months for follow-up.  He was unable to obtain urine sample today for ALB check.

## 2021-04-10 NOTE — Assessment & Plan Note (Signed)
Chronic, ongoing.  Have not received previous PCP records as of yet.  Continue current medication regimen and collaboration with local cardiology.  Return to office in 3 months.  Have highly recommend he schedule follow-up with cardiology, has missed due to his return to [place] for a period.

## 2021-04-10 NOTE — Assessment & Plan Note (Signed)
Chronic, ongoing issue with no recent CP or NTG use.  Have highly recommend he schedule follow-up with cardiology, has missed due to his return to [place] for a period.

## 2021-04-10 NOTE — Assessment & Plan Note (Signed)
Ongoing issue to left lower leg.  Reports he had debridement in [place] recently and it is improving.  It is dressed today.  Order for home health for further evaluation and monitoring.  May need wound care in future.

## 2021-04-10 NOTE — Patient Instructions (Signed)

## 2021-04-10 NOTE — Assessment & Plan Note (Signed)
Chronic, ongoing.  Will continue Ambien, reviewed PDMP and has been taking this.  Controlled substance contract signed.  Adjust regimen as needed.  Return in 3 months.

## 2021-04-10 NOTE — Assessment & Plan Note (Signed)
BMI 37.30 with T2DM, ESRD.  Recommended eating smaller high protein, low fat meals more frequently and exercising 30 mins a day 5 times a week with a goal of 10-15lb weight loss in the next 3 months. Patient voiced their understanding and motivation to adhere to these recommendations.

## 2021-04-10 NOTE — Assessment & Plan Note (Signed)
Chronic, ongoing, followed by nephrology and dialysis teams.  Unsure about diet compliance as tends to eat fast food on occasion.  Continue current medication regimen as prescribed, refills sent.  Attempt to obtain recent notes from [place].  Return to office in 3 months for follow-up.  Obtain CBC, CMP, and TSH today.

## 2021-04-10 NOTE — Assessment & Plan Note (Signed)
Chronic, ongoing, followed by dialysis team.  BP initial elevated, but reports lows with dialysis -- will monitor closely.  Continue current medication regimen as prescribed by them.  Attempt to obtain recent notes from [place].  Labs today CMP and CBC, lipid.  Recommend he monitor BP at home a few days a week and document + focus on DASH diet.  Referral to local nephrology as reports he sees no one.  Return to office in 3 months.

## 2021-04-10 NOTE — Assessment & Plan Note (Signed)
With atrial fibrillation and on Eliquis.  CBC today.  Continue to collaborate with cardiology and continue current medication regimen.

## 2021-04-11 LAB — CBC WITH DIFFERENTIAL/PLATELET
Basophils Absolute: 0.1 10*3/uL (ref 0.0–0.2)
Basos: 1 %
EOS (ABSOLUTE): 0.4 10*3/uL (ref 0.0–0.4)
Eos: 7 %
Hematocrit: 35.1 % — ABNORMAL LOW (ref 37.5–51.0)
Hemoglobin: 11.6 g/dL — ABNORMAL LOW (ref 13.0–17.7)
Immature Grans (Abs): 0 10*3/uL (ref 0.0–0.1)
Immature Granulocytes: 1 %
Lymphocytes Absolute: 0.8 10*3/uL (ref 0.7–3.1)
Lymphs: 15 %
MCH: 29 pg (ref 26.6–33.0)
MCHC: 33 g/dL (ref 31.5–35.7)
MCV: 88 fL (ref 79–97)
Monocytes Absolute: 0.5 10*3/uL (ref 0.1–0.9)
Monocytes: 9 %
Neutrophils Absolute: 3.8 10*3/uL (ref 1.4–7.0)
Neutrophils: 67 %
Platelets: 130 10*3/uL — ABNORMAL LOW (ref 150–450)
RBC: 4 x10E6/uL — ABNORMAL LOW (ref 4.14–5.80)
RDW: 15.5 % — ABNORMAL HIGH (ref 11.6–15.4)
WBC: 5.6 10*3/uL (ref 3.4–10.8)

## 2021-04-11 LAB — COMPREHENSIVE METABOLIC PANEL
ALT: 27 IU/L (ref 0–44)
AST: 36 IU/L (ref 0–40)
Albumin/Globulin Ratio: 1.2 (ref 1.2–2.2)
Albumin: 4.4 g/dL (ref 3.8–4.9)
Alkaline Phosphatase: 105 IU/L (ref 44–121)
BUN/Creatinine Ratio: 5 — ABNORMAL LOW (ref 9–20)
BUN: 42 mg/dL — ABNORMAL HIGH (ref 6–24)
Bilirubin Total: 0.8 mg/dL (ref 0.0–1.2)
CO2: 26 mmol/L (ref 20–29)
Calcium: 8.7 mg/dL (ref 8.7–10.2)
Chloride: 94 mmol/L — ABNORMAL LOW (ref 96–106)
Creatinine, Ser: 8.02 mg/dL — ABNORMAL HIGH (ref 0.76–1.27)
Globulin, Total: 3.7 g/dL (ref 1.5–4.5)
Glucose: 99 mg/dL (ref 70–99)
Potassium: 4.4 mmol/L (ref 3.5–5.2)
Sodium: 141 mmol/L (ref 134–144)
Total Protein: 8.1 g/dL (ref 6.0–8.5)
eGFR: 7 mL/min/{1.73_m2} — ABNORMAL LOW (ref >59)

## 2021-04-11 LAB — TSH: TSH: 3.38 u[IU]/mL (ref 0.450–4.500)

## 2021-04-11 LAB — LIPID PANEL W/O CHOL/HDL RATIO
Cholesterol, Total: 144 mg/dL (ref 100–199)
HDL: 68 mg/dL (ref >39)
LDL Chol Calc (NIH): 59 mg/dL (ref 0–99)
Triglycerides: 92 mg/dL (ref 0–149)
VLDL Cholesterol Cal: 17 mg/dL (ref 5–40)

## 2021-04-11 LAB — HIV ANTIBODY (ROUTINE TESTING W REFLEX): HIV Screen 4th Generation wRfx: NONREACTIVE

## 2021-04-11 LAB — HEPATITIS C ANTIBODY: Hep C Virus Ab: <0.1 s/co ratio (ref 0.0–0.9)

## 2021-04-11 LAB — VITAMIN B12: Vitamin B-12: 941 pg/mL (ref 232–1245)

## 2021-04-11 LAB — PSA: Prostate Specific Ag, Serum: 1.1 ng/mL (ref 0.0–4.0)

## 2021-04-11 NOTE — Progress Notes (Signed)
Good afternoon, please let Jasiah know his labs have returned and overall remain at baseline for him with end stage kidney disease and some anemia with this.  Please ensure to continue dialysis as scheduled and follow-up with kidney doctor.  Remainder of labs are all within normal ranges, including thyroid and prostate level.  Continue all current medications.  Any questions? Keep being awesome!!  Thank you for allowing me to participate in your care.  I appreciate you. Kindest regards, Venora Kautzman

## 2021-04-13 ENCOUNTER — Emergency Department
Admission: EM | Admit: 2021-04-13 | Discharge: 2021-04-13 | Disposition: A | Discharge status: Home / Self Care | Payer: Medicare Other | Attending: Emergency Medicine | Admitting: Emergency Medicine

## 2021-04-13 ENCOUNTER — Emergency Department: Payer: Medicare Other

## 2021-04-13 ENCOUNTER — Other Ambulatory Visit: Payer: Self-pay

## 2021-04-13 ENCOUNTER — Encounter: Payer: Self-pay | Admitting: Emergency Medicine

## 2021-04-13 DIAGNOSIS — Z79899 Other long term (current) drug therapy: Secondary | ICD-10-CM

## 2021-04-13 DIAGNOSIS — E1122 Type 2 diabetes mellitus with diabetic chronic kidney disease: Secondary | ICD-10-CM | POA: Insufficient documentation

## 2021-04-13 DIAGNOSIS — Z992 Dependence on renal dialysis: Secondary | ICD-10-CM | POA: Insufficient documentation

## 2021-04-13 DIAGNOSIS — I129 Hypertensive chronic kidney disease with stage 1 through stage 4 chronic kidney disease, or unspecified chronic kidney disease: Secondary | ICD-10-CM | POA: Insufficient documentation

## 2021-04-13 DIAGNOSIS — N189 Chronic kidney disease, unspecified: Secondary | ICD-10-CM | POA: Insufficient documentation

## 2021-04-13 DIAGNOSIS — R4182 Altered mental status, unspecified: Secondary | ICD-10-CM | POA: Diagnosis not present

## 2021-04-13 LAB — BLOOD GAS, ARTERIAL
Acid-Base Excess: 6.3 mmol/L — ABNORMAL HIGH (ref 0.0–2.0)
Bicarbonate: 33 mmol/L — ABNORMAL HIGH (ref 20.0–28.0)
O2 Saturation: 95.7 %
Patient temperature: 37
pCO2 arterial: 57 mmHg — ABNORMAL HIGH (ref 32.0–48.0)
pH, Arterial: 7.37 (ref 7.350–7.450)
pO2, Arterial: 82 mmHg — ABNORMAL LOW (ref 83.0–108.0)

## 2021-04-13 LAB — CBC WITH DIFFERENTIAL/PLATELET
Abs Immature Granulocytes: 0.04 10*3/uL (ref 0.00–0.07)
Basophils Absolute: 0.1 10*3/uL (ref 0.0–0.1)
Basophils Relative: 1 %
Eosinophils Absolute: 0.6 10*3/uL — ABNORMAL HIGH (ref 0.0–0.5)
Eosinophils Relative: 9 %
HCT: 34.2 % — ABNORMAL LOW (ref 39.0–52.0)
Hemoglobin: 10.4 g/dL — ABNORMAL LOW (ref 13.0–17.0)
Immature Granulocytes: 1 %
Lymphocytes Relative: 15 %
Lymphs Abs: 0.9 10*3/uL (ref 0.7–4.0)
MCH: 28.7 pg (ref 26.0–34.0)
MCHC: 30.4 g/dL (ref 30.0–36.0)
MCV: 94.2 fL (ref 80.0–100.0)
Monocytes Absolute: 0.6 10*3/uL (ref 0.1–1.0)
Monocytes Relative: 11 %
Neutro Abs: 3.7 10*3/uL (ref 1.7–7.7)
Neutrophils Relative %: 63 %
Platelets: 116 10*3/uL — ABNORMAL LOW (ref 150–400)
RBC: 3.63 MIL/uL — ABNORMAL LOW (ref 4.22–5.81)
RDW: 17 % — ABNORMAL HIGH (ref 11.5–15.5)
Smear Review: NORMAL
WBC: 5.9 10*3/uL (ref 4.0–10.5)
nRBC: 0.5 % — ABNORMAL HIGH (ref 0.0–0.2)

## 2021-04-13 LAB — COMPREHENSIVE METABOLIC PANEL
ALT: 23 U/L (ref 0–44)
AST: 30 U/L (ref 15–41)
Albumin: 3.5 g/dL (ref 3.5–5.0)
Alkaline Phosphatase: 84 U/L (ref 38–126)
Anion gap: 12 (ref 5–15)
BUN: 48 mg/dL — ABNORMAL HIGH (ref 6–20)
CO2: 31 mmol/L (ref 22–32)
Calcium: 8.4 mg/dL — ABNORMAL LOW (ref 8.9–10.3)
Chloride: 97 mmol/L — ABNORMAL LOW (ref 98–111)
Creatinine, Ser: 8.67 mg/dL — ABNORMAL HIGH (ref 0.61–1.24)
GFR, Estimated: 6 mL/min — ABNORMAL LOW (ref >60)
Glucose, Bld: 140 mg/dL — ABNORMAL HIGH (ref 70–99)
Potassium: 4.3 mmol/L (ref 3.5–5.1)
Sodium: 140 mmol/L (ref 135–145)
Total Bilirubin: 1.2 mg/dL (ref 0.3–1.2)
Total Protein: 8.2 g/dL — ABNORMAL HIGH (ref 6.5–8.1)

## 2021-04-13 LAB — TROPONIN I (HIGH SENSITIVITY)
Troponin I (High Sensitivity): 84 ng/L — ABNORMAL HIGH (ref <18)
Troponin I (High Sensitivity): 65 ng/L — ABNORMAL HIGH (ref <18)

## 2021-04-13 MED ORDER — NALOXONE HCL 2 MG/2ML IJ SOSY: 2.0000 mg | PREFILLED_SYRINGE | Freq: Once | INTRAMUSCULAR | Status: AC

## 2021-04-13 MED ORDER — NALOXONE HCL 2 MG/2ML IJ SOSY
PREFILLED_SYRINGE | INTRAMUSCULAR | Status: AC
  Administered 2021-04-13: 1.5 mg via INTRAVENOUS
  Filled 2021-04-13: qty 2

## 2021-04-13 NOTE — ED Triage Notes (Signed)
Pt to ED via [facility] from home for Altered Mental Status. Per EMS pt able to walk out to the truck when they got here. Family called because pt was more sleepy than normal.  Pt is dialysis pt. Pt family think that he may have taken more of his sleeping medication than he is supposed to.  Pt with snoring on arrival, pt is able to aroused with stimulation, pt has pin point pupils.

## 2021-04-13 NOTE — Discharge Instructions (Addendum)
Your confusion today seems likely related to multiple different medications.  I am concerned about his use of cyclobenzaprine, hydrocodone, trazodone, and zolpidem.  You should limit the use of these medications as much as possible and schedule follow-up with your primary care doctor for potential medication adjustments.  Please return to the ER for reevaluation for worsening confusion or any other worsening symptoms.

## 2021-04-13 NOTE — ED Provider Notes (Signed)
[facility] Provider Note    Event Date/Time   First MD Initiated Contact with Patient 04/13/21 1248     (approximate)   History   Altered Mental Status   HPI  Dayln Tugwell is a 61 y.o. male EMS called by family provides history given by family.  Patient apparently became much more sleepy today than usual.  Patient gets dialysis.  Patient has A. fib.  Patient has diabetes.  CBG was 129.  Patient has chronic kidney disease and hypertension as well.  Patient says he has sleep apnea.  He wakes up and cannot answer questions.  He denies taking any medications.  Then he gets very sleepy and goes back to sleep and begins desaturating.      Physical Exam   Triage Vital Signs: ED Triage Vitals  Enc Vitals Group     BP 04/13/21 1247 (!) 149/129     Pulse Rate 04/13/21 1245 82     Resp 04/13/21 1245 20     Temp --      Temp src --      SpO2 04/13/21 1245 99 %     Weight --      Height --      Head Circumference --      Peak Flow --      Pain Score 04/13/21 1246 0     Pain Loc --      Pain Edu? --      Excl. in [place]? --     Most recent vital signs: Vitals:   04/13/21 1330 04/13/21 1345  BP:    Pulse: 82 84  Resp:    SpO2: 98% 98%     General: Patient sleepy but arousable, alert when aroused.  Oriented when aroused. Head normocephalic atraumatic Eyes: Pupils are small and round appear to be reactive extraocular movements intact CV:  Good peripheral perfusion.  Heart regular rate and rhythm no audible murmurs Resp:  Normal effort.  Lung sounds are normal and clear Abd:  No distention.  Abdomen is soft and nontender Neuro: Once patient is fully awake he is able to follow commands well.  Cranial nerves II through XII appear to be intact although visual fields were not checked.  Cerebellar finger-nose initially was somewhat slow but is patient woke up became normal.  Motor strength is good throughout arms and legs.  Patient does not report any  numbness.   ED Results / Procedures / Treatments   Labs (all labs ordered are listed, but only abnormal results are displayed) Labs Reviewed  BLOOD GAS, ARTERIAL - Abnormal; Notable for the following components:      Result Value   pCO2 arterial 57 (*)    pO2, Arterial 82 (*)    Bicarbonate 33.0 (*)    Acid-Base Excess 6.3 (*)    All other components within normal limits  COMPREHENSIVE METABOLIC PANEL - Abnormal; Notable for the following components:   Chloride 97 (*)    Glucose, Bld 140 (*)    BUN 48 (*)    Creatinine, Ser 8.67 (*)    Calcium 8.4 (*)    Total Protein 8.2 (*)    GFR, Estimated 6 (*)    All other components within normal limits  CBC WITH DIFFERENTIAL/PLATELET - Abnormal; Notable for the following components:   RBC 3.63 (*)    Hemoglobin 10.4 (*)    HCT 34.2 (*)    RDW 17.0 (*)    Platelets 116 (*)  nRBC 0.5 (*)    Eosinophils Absolute 0.6 (*)    All other components within normal limits  TROPONIN I (HIGH SENSITIVITY) - Abnormal; Notable for the following components:   Troponin I (High Sensitivity) 84 (*)    All other components within normal limits  URINE DRUG SCREEN, QUALITATIVE ([facility] ONLY)  TROPONIN I (HIGH SENSITIVITY)     EKG  EKG read interpreted by me shows A. fib at 75 rightward axis no obvious acute ST-T changes.   RADIOLOGY  Chest x-ray read by radiology reviewed by me shows a large heart possible increased vascular markings consistent with mild CHF.  PROCEDURES:  Critical Care performed:   Procedures   MEDICATIONS ORDERED IN ED: Medications  naloxone (NARCAN) injection 2 mg (1.5 mg Intravenous Given 04/13/21 1248)     IMPRESSION / MDM / ASSESSMENT AND PLAN / ED COURSE  I reviewed the triage vital signs and the nursing notes. Patient given Narcan because he apparently was more sleepy than he was when EMS got there.  Patient EMS reports he walked out to the stretcher.  Narcan did not appear to do anything for this  gentleman.  Patient's blood gas does not support using BiPAP at this point.  The patient is on the cardiac monitor to evaluate for evidence of arrhythmia and/or significant heart rate changes.  Patient is having A. fib with a controlled rate.  I was trying to contact family but I not been able to contact the daughter at [phone].  The other phone #[phone] is the patient's phone that he has with him.  Patient has a different number for his daughter at [phone] I called this number and daughter answered she is coming in.  She confirms that he is just a lot more sleepy today than usual.  She will bring his medications with her.  ----------------------------------------- 1:59 PM on 04/13/2021 ----------------------------------------- Patient's daughter is here.  She brings his medications.  He has several bottles which are duplicates.  Were not sure if he has been taking 1 pill out of each bottle.  Daughter will manage this problem.  ----------------------------------------- 3:21 PM on 04/13/2021 ----------------------------------------- Patient remains awake alert oriented moving around.  It does seem like he took too much of some medication.  We will get his second troponin back anticipate discharge if this is stable.  I did asked the patient.  He is not homicidal or suicidal and did not intend to take any extra medication. FINAL CLINICAL IMPRESSION(S) / ED DIAGNOSES   Final diagnoses:  Altered mental status, unspecified altered mental status type     Rx / DC Orders   ED Discharge Orders     None        Note:  This document was prepared using Dragon voice recognition software and may include unintentional dictation errors.   Nena Polio, MD 04/13/21 [phone]

## 2021-04-13 NOTE — ED Notes (Signed)
Family at bedside. 

## 2021-04-13 NOTE — ED Provider Notes (Signed)
----------------------------------------- °  4:10 PM on 04/13/2021 -----------------------------------------  Blood pressure (!) 149/129, pulse 84, resp. rate (!) 27, SpO2 98 %.  Assuming care from Dr. Cinda Quest.  In short, Adam Howell is a 61 y.o. male with a chief complaint of Altered Mental Status .  Refer to the original H&P for additional details.  The current plan of care is to follow-up repeat troponin and observe for AMS suspected to be related to polypharmacy.  ----------------------------------------- 4:32 PM on 04/13/2021 ----------------------------------------- Repeat troponin is downtrending and on reassessment, patient is awake and alert with no complaints.  Family states that he is back to his baseline mental status and was requesting that he be discharged home.  They were counseled to closely monitor his medicines and follow-up with PCP, otherwise counseled to return to the ED for new worsening symptoms.  Patient and family agree with plan.    Blake Divine, MD 04/13/21 [phone]

## 2021-04-13 NOTE — Addendum Note (Signed)
Addended by: [name] T on: 04/13/2021 06:21 PM   Modules accepted: Orders

## 2021-04-13 NOTE — ED Notes (Signed)
Narcan 0.5 given- VO per Dr Cinda Quest

## 2021-04-15 ENCOUNTER — Telehealth: Payer: Self-pay

## 2021-04-15 NOTE — Telephone Encounter (Signed)
Virtual consult scheduled for 04/17/21 at 2:00 pm

## 2021-04-17 ENCOUNTER — Other Ambulatory Visit: Payer: Medicare Other | Admitting: Nurse Practitioner

## 2021-04-17 ENCOUNTER — Encounter: Payer: Self-pay | Admitting: Nurse Practitioner

## 2021-04-17 ENCOUNTER — Other Ambulatory Visit: Payer: Self-pay

## 2021-04-17 DIAGNOSIS — R0602 Shortness of breath: Secondary | ICD-10-CM

## 2021-04-17 DIAGNOSIS — N186 End stage renal disease: Secondary | ICD-10-CM

## 2021-04-17 NOTE — Progress Notes (Signed)
Therapist, nutritional Palliative Care Consult Note Telephone: [phone]  Fax: [phone]   Date of encounter: 04/17/21 4:09 PM PATIENT NAME: Adam Howell [address] [place] [address]   [phone] (home)  DOB: 07-06-1960 MRN: [id number] PRIMARY CARE PROVIDER:    Marjie Skiff, NP,  [address] [place] [address] [phone]  REFERRING PROVIDER:   Marjie Skiff, NP [address] [place],  [place] [address] [phone]  RESPONSIBLE PARTY:    Contact Information     Name Relation Home Work Mobile   speel,michelle Daughter [phone]  [phone]       Due to the COVID-19 crisis, this visit was done via telemedicine from my office and it was initiated and consent by this patient and or family.  I connected with  Adam Howell, Adam Howell daughter with Adam Howell OR PROXY on 04/17/21 by a telephone as video enabled telemedicine application and verified that I am speaking with the correct person using two identifiers.   I discussed the limitations of evaluation and management by telemedicine. The patient expressed understanding and agreed to proceed. Palliative Care was asked to follow this patient by consultation request of  Marjie Skiff, NP to address advance care planning and complex medical decision making. This is the initial visit.                            ASSESSMENT AND PLAN / RECOMMENDATIONS:  Symptom Management/Plan: 1. Advance Care Planning; Full code UNDER PAIN CONTRACT WITH [facility] 2. Goals of Care: Goals include to maximize quality of life and symptom management. Our advance care planning conversation included a discussion about:    The value and importance of advance care planning  Exploration of personal, cultural or spiritual beliefs that might influence medical decisions  Exploration of goals of care in the event of a sudden injury or illness  Identification and preparation of a  healthcare agent  Review and updating or creation of an advance directive document.  3. Palliative care encounter; Palliative care encounter; Palliative medicine team will continue to support patient, patient's family, and medical team. Visit consisted of counseling and education dealing with the complex and emotionally intense issues of symptom management and palliative care in the setting of serious and potentially life-threatening illness  4. Shortness of breath secondary to ESRD, continue compliance with treatments, weights; current weight 298 lbs  5. f/u 1 month for ongoing monitoring chronic disease progression, ongoing discussions complex medical decision making  Follow up Palliative Care Visit: Palliative care will continue to follow for complex medical decision making, advance care planning, and clarification of goals. Return 4 weeks or prn.  I spent 62 minutes providing this consultation. More than 50% of the time in this consultation was spent in counseling and care coordination. PPS: 50%  Chief Complaint: Initial consult palliative consult for complex medical decision making  HISTORY OF PRESENT ILLNESS:  Mylik Pro is a 61 y.o. year old male  with multiple medical problems including ESRD on HD days Monday, Wed, Friday, HTN, afib, CHF, vasculitis ulcer lower extremity, DM, HLD, anemia, thrombophillia, chronic pain syndrome (under pain contract at [facility] with Aura Dials NP), insomnia, morbid obesity. I called Adam Howell, Adam Howell daughter for telemedicine telephonic visit as video not available. Adam Howell with Adam Kendra both in agreement. We talked about purpose of PC visit, last time Adam Howell was independent as  he currently resides with Adam Howell. We talked about past medical history, ros, symptoms, his functional abilities, does his own adl's, feeds himself with good appetite. We talked about HD, he takes transportation to HD. We talked about medical goals,  role pc in poc, life review, family dynamics. We talked about daily routine. Adam Howell endorses Adam. Fortson has a wound and was told a home health would be coming to care for it. Discussed will touch base with Aura Dials NP. We talked about f/u pc visit, scheduled. Therapeutic listening, emotional support provided. Questions answered.   History obtained from review of EMR, discussion with with Adam Howell, Adam Howell daughter with Adam. Howell.  I reviewed available labs, medications, imaging, studies and related documents from the EMR.  Records reviewed and summarized above.   ROS 10 point system reviewed with Adam Howell and daughter, Adam Howell negative except HPI  Physical Exam: deferred CURRENT PROBLEM LIST:  Patient Active Problem List   Diagnosis Date Noted   Controlled substance agreement signed 04/10/2021   Vasculitic ulcer of lower extremity (HCC) 02/02/2020   Other thrombophilia (HCC) 01/19/2020   Atrial fibrillation (HCC) 11/11/2019   Hyperlipidemia associated with type 2 diabetes mellitus (HCC) 11/11/2019   Erectile dysfunction 11/11/2019   Acute cough 11/07/2019   Heart failure with reduced ejection fraction (HCC) 10/26/2019   Type 2 diabetes mellitus with ESRD (end-stage renal disease) (HCC) 10/04/2019   Chronic pain syndrome 10/04/2019   Insomnia 10/04/2019   Atherosclerotic heart disease of native coronary artery without angina pectoris 09/01/2019   Hypertensive heart and kidney disease with heart failure and chronic kidney disease stage V (HCC) 09/01/2019   Morbid obesity (HCC) 09/01/2019   Anemia in chronic kidney disease 07/25/2019   End stage renal disease (HCC) 07/25/2019   Secondary hyperparathyroidism of renal origin (HCC) 07/25/2019   PAST MEDICAL HISTORY:  Active Ambulatory Problems    Diagnosis Date Noted   Anemia in chronic kidney disease 07/25/2019   Atherosclerotic heart disease of native coronary artery without angina pectoris 09/01/2019   End stage renal  disease (HCC) 07/25/2019   Hypertensive heart and kidney disease with heart failure and chronic kidney disease stage V (HCC) 09/01/2019   Morbid obesity (HCC) 09/01/2019   Secondary hyperparathyroidism of renal origin (HCC) 07/25/2019   Type 2 diabetes mellitus with ESRD (end-stage renal disease) (HCC) 10/04/2019   Chronic pain syndrome 10/04/2019   Insomnia 10/04/2019   Heart failure with reduced ejection fraction (HCC) 10/26/2019   Acute cough 11/07/2019   Atrial fibrillation (HCC) 11/11/2019   Hyperlipidemia associated with type 2 diabetes mellitus (HCC) 11/11/2019   Erectile dysfunction 11/11/2019   Other thrombophilia (HCC) 01/19/2020   Vasculitic ulcer of lower extremity (HCC) 02/02/2020   Controlled substance agreement signed 04/10/2021   Resolved Ambulatory Problems    Diagnosis Date Noted   Hypertensive kidney disease with ESRD on dialysis (HCC) 07/25/2019   Peripheral vascular disease (HCC) 07/25/2019   Essential hypertension 11/11/2019   Pain in both lower extremities 12/25/2019   Skin lesions 01/22/2020   Past Medical History:  Diagnosis Date   Chronic kidney disease    Congestive heart failure (HCC)    Diabetes mellitus without complication (HCC)    Hyperlipidemia    Hypertension    SOCIAL HX:  Social History   Tobacco Use   Smoking status: Never   Smokeless tobacco: Never  Substance Use Topics   Alcohol use: Not Currently   FAMILY HX:  Family History  Problem Relation Age of Onset  Heart failure Mother    Cirrhosis Father    Hypertension Daughter      ALLERGIES:  Allergies  Allergen Reactions   Tramadol Rash     PERTINENT MEDICATIONS:  Outpatient Encounter Medications as of 04/17/2021  Medication Sig   albuterol (VENTOLIN HFA) 108 (90 Base) MCG/ACT inhaler Inhale 2 puffs into the lungs every 6 (six) hours as needed for wheezing or shortness of breath.   amiodarone (PACERONE) 200 MG tablet Take 1 tablet (200 mg total) by mouth 2 (two) times daily.    benzonatate (TESSALON PERLES) 100 MG capsule Take 1 capsule (100 mg total) by mouth 3 (three) times daily as needed for cough.   Blood Glucose Monitoring Suppl (ONETOUCH VERIO) w/Device KIT Use to check blood sugar 3 times a day and document results, bring to appointments.  Goal is <130 fasting blood sugar and <180 two hours after meals.   Blood Pressure Monitoring (BLOOD PRESSURE MONITOR AUTOMAT) DEVI Use to check blood pressure twice daily.   bumetanide (BUMEX) 1 MG tablet Take 1 tablet (1 mg total) by mouth 2 (two) times daily.   carvedilol (COREG) 3.125 MG tablet Take 1 tablet (3.125 mg total) by mouth 2 (two) times daily.   cyclobenzaprine (FLEXERIL) 10 MG tablet Take 1 tablet (10 mg total) by mouth 3 (three) times daily as needed for muscle spasms.   diclofenac Sodium (VOLTAREN) 1 % GEL Apply 2 g topically 4 (four) times daily as needed (leg pain).   ELIQUIS 2.5 MG TABS tablet Take 1 tablet (2.5 mg total) by mouth 2 (two) times daily. Sig: Daily as presciribed.   furosemide (LASIX) 80 MG tablet Take 1 tablet (80 mg total) by mouth 2 (two) times daily.   gabapentin (NEURONTIN) 300 MG capsule Take 1 capsule (300 mg total) by mouth 2 (two) times daily.   glucose blood (ONETOUCH VERIO) test strip Use to check blood sugar 4 times a day.   guaiFENesin (MUCINEX) 600 MG 12 hr tablet Take 1 tablet (600 mg total) by mouth 2 (two) times daily as needed.   HYDROcodone-acetaminophen (NORCO) 10-325 MG tablet Take 1 tablet by mouth every 6 (six) hours as needed.   Insulin Pen Needle (NOVOFINE) 30G X 8 MM MISC Use to inject insulin daily   isosorbide mononitrate (IMDUR) 30 MG 24 hr tablet Take 1 tablet (30 mg total) by mouth daily.   Lancets (ONETOUCH ULTRASOFT) lancets Use to check blood sugar 4 times a day.   LEVEMIR FLEXTOUCH 100 UNIT/ML FlexTouch Pen Inject 20 Units into the skin daily.   Lidocaine 5 % CREA Apply 1 application topically daily.   losartan (COZAAR) 100 MG tablet Take 1 tablet (100 mg  total) by mouth daily.   NIFEdipine (PROCARDIA-XL/NIFEDICAL-XL) 30 MG 24 hr tablet Take 1 tablet (30 mg total) by mouth daily.   rosuvastatin (CRESTOR) 40 MG tablet Take 1 tablet (40 mg total) by mouth daily.   sevelamer carbonate (RENVELA) 800 MG tablet Take 2 tablets (1,600 mg total) by mouth 3 (three) times daily.   traZODone (DESYREL) 50 MG tablet Take 1 tablet (50 mg total) by mouth at bedtime as needed for sleep.   triamcinolone cream (KENALOG) 0.1 % Apply 1 application topically 2 (two) times daily.   zolpidem (AMBIEN) 10 MG tablet Take 1 tablet (10 mg total) by mouth at bedtime as needed.   No facility-administered encounter medications on file as of 04/17/2021.   Thank you for the opportunity to participate in the care of Adam. Crull.  The palliative care team will continue to follow. Please call our office at [phone] if we can be of additional assistance.   This chart was dictated using voice recognition software.  Despite best efforts to proofread,  errors can occur which can change the documentation meaning.   Questions and concerns were addressed. The patient/family was encouraged to call with questions and/or concerns. My contact information was provided. Provided general support and encouragement, no other unmet needs identified   Lia Vigilante Ihor Gully, NP ,

## 2021-04-18 ENCOUNTER — Ambulatory Visit: Payer: Self-pay

## 2021-04-18 ENCOUNTER — Ambulatory Visit (INDEPENDENT_AMBULATORY_CARE_PROVIDER_SITE_OTHER): Payer: Medicare Other | Admitting: Nurse Practitioner

## 2021-04-18 ENCOUNTER — Other Ambulatory Visit: Payer: Self-pay

## 2021-04-18 ENCOUNTER — Encounter: Payer: Self-pay | Admitting: Nurse Practitioner

## 2021-04-18 VITALS — BP 144/86 | HR 80 | Temp 97.8°F | Resp 20 | Wt 298.0 lb

## 2021-04-18 DIAGNOSIS — L97909 Non-pressure chronic ulcer of unspecified part of unspecified lower leg with unspecified severity: Secondary | ICD-10-CM

## 2021-04-18 MED ORDER — DOXYCYCLINE HYCLATE 100 MG PO TABS: 100.0000 mg | ORAL_TABLET | Freq: Two times a day (BID) | ORAL | 0 refills | Status: DC

## 2021-04-18 MED ORDER — SANTYL 250 UNIT/GM EX OINT: 1.0000 "application " | TOPICAL_OINTMENT | Freq: Two times a day (BID) | CUTANEOUS | 4 refills | Status: DC

## 2021-04-18 NOTE — Patient Instructions (Signed)
Wound Care, Adult ?Taking care of your wound properly can help to prevent pain, infection, and scarring. It can also help your wound heal more quickly. Follow instructions from your health care provider about how to care for your wound. ?Supplies needed: ?Soap and water. ?Wound cleanser, saline, or germ-free (sterile) water. ?Gauze. ?If needed, a clean bandage (dressing) or other type of wound dressing material to cover or place in the wound. Follow your health care provider's instructions about what dressing supplies to use. ?Cream or topical ointment to apply to the wound, if told by your health care provider. ?How to care for your wound ?Cleaning the wound ?Ask your health care provider how to clean the wound. This may include: ?Using mild soap and water, a wound cleanser, saline, or sterile water. ?Using a clean gauze to pat the wound dry after cleaning it. Do not rub or scrub the wound. ?Dressing care ?Wash your hands with soap and water for at least 20 seconds before and after you change the dressing. If soap and water are not available, use hand sanitizer. ?Change your dressing as told by your health care provider. This may include: ?Cleaning or rinsing out (irrigating) the wound. ?Application of cream or topical ointment, if told by your health care provider. ?Placing a dressing over the wound or in the wound (packing). ?Covering the wound with an outer dressing. ?Leave stitches (sutures), staples, skin glue, or adhesive strips in place. These skin closures may need to stay in place for 2 weeks or longer. If adhesive strip edges start to loosen and curl up, you may trim the loose edges. Do not remove adhesive strips completely unless your health care provider tells you to do that. ?Ask your health care provider when you can leave the wound uncovered. ?Checking for infection ?Check your wound area every day for signs of infection. Check for: ?More redness, swelling, or pain. ?Fluid or blood. ?Warmth. ?Pus or  a bad smell. ? ?Follow these instructions at home ?Medicines ?If you were prescribed an antibiotic medicine, cream, or ointment, take or apply it as told by your health care provider. Do not stop using the antibiotic even if your condition improves. ?If you were prescribed pain medicine, take it 30 minutes before you do any wound care or as told by your health care provider. ?Take over-the-counter and prescription medicines only as told by your health care provider. ?Eating and drinking ?Eat a diet that includes protein, vitamin A, vitamin C, and other nutrient-rich foods to help the wound heal. ?Foods rich in protein include meat, fish, eggs, dairy, beans, and nuts. ?Foods rich in vitamin A include carrots and dark green, leafy vegetables. ?Foods rich in vitamin C include citrus fruits, tomatoes, broccoli, and peppers. ?Drink enough fluid to keep your urine pale yellow. ?General instructions ?Do not take baths, swim, or use a hot tub until your health care provider approves. Ask your health care provider if you may take showers. You may only be allowed to take sponge baths. ?Do not scratch or pick at the wound. Keep it covered as told by your health care provider. ?Return to your normal activities as told by your health care provider. Ask your health care provider what activities are safe for you. ?Protect your wound from the sun when you are outside for the first 6 months, or for as long as told by your health care provider. Cover up the scar area or apply sunscreen that has an SPF of at least 30. ?Do not   use any products that contain nicotine or tobacco. These products include cigarettes, chewing tobacco, and vaping devices, such as e-cigarettes. If you need help quitting, ask your health care provider. ?Keep all follow-up visits. This is important. ?Contact a health care provider if: ?You received a tetanus shot and you have swelling, severe pain, redness, or bleeding at the injection site. ?Your pain is not  controlled with medicine. ?You have any of these signs of infection: ?More redness, swelling, or pain around the wound. ?Fluid or blood coming from the wound. ?Warmth coming from the wound. ?A fever or chills. ?You are nauseous or you vomit. ?You are dizzy. ?You have a new rash or hardness around the wound. ?Get help right away if: ?You have a red streak of skin near the area around your wound. ?Pus or a bad smell coming from the wound. ?Your wound has been closed with staples, sutures, skin glue, or adhesive strips and it begins to open up and separate. ?Your wound is bleeding, and the bleeding does not stop with gentle pressure. ?These symptoms may represent a serious problem that is an emergency. Do not wait to see if the symptoms will go away. Get medical help right away. Call your local emergency services (911 in the [place] Do not drive yourself to the hospital. ?Summary ?Always wash your hands with soap and water for at least 20 seconds before and after changing your dressing. ?Change your dressing as told by your health care provider. ?To help with healing, eat foods that are rich in protein, vitamin A, vitamin C, and other nutrients. ?Check your wound every day for signs of infection. Contact your health care provider if you think that your wound is infected. ?This information is not intended to replace advice given to you by your health care provider. Make sure you discuss any questions you have with your health care provider. ?Document Revised: 07/09/2020 Document Reviewed: 07/09/2020 ?Elsevier Patient Education ? 2022 [employer] ? ?

## 2021-04-18 NOTE — Telephone Encounter (Signed)
° ° °  Chief Complaint: Wound left calf Symptoms: Has an odor and is draining yellow. "Size of my hand." Frequency: Started 2 years ago Pertinent Negatives: Patient denies fever Disposition: [] ED /[] Urgent Care (no appt availability in office) / [] Appointment(In office/virtual)/ []  [facility]/ [] Home Care/ [] Refused Recommended Disposition /[] [facility]/ [x]  Follow-up with PCP Additional Notes: Pt. Requesting to have home health nurse come to his home and dress wound. Please advise pt.   Answer Assessment - Initial Assessment Questions 1. APPEARANCE of INJURY: "What does the injury look like?"      Leg lower leg 2. SIZE: "How large is the cut?"      Size of his hand 3. BLEEDING: "Is it bleeding now?" If Yes, ask: "Is it difficult to stop?"      No 4. LOCATION: "Where is the injury located?"      Left leg 5. ONSET: "How long ago did the injury occur?"      2 years ago 6. MECHANISM: "Tell me how it happened."      N/a 7. TETANUS: "When was the last tetanus booster?"     Unsure 8. PREGNANCY: "Is there any chance you are pregnant?" "When was your last menstrual period?"     N/a  Protocols used: Skin Injury-A-AH

## 2021-04-18 NOTE — Assessment & Plan Note (Signed)
Ongoing issue to left lower leg.  Reports he had debridement in [place] recently and it is improving.  Assessed area today and obtained culture.  Will send in Santyl to apply to wound and recommend non adherent dressing over top with loose wrap.  Treat with Doxycycline 100 MG BID x 10 days at this time and change based on culture.  Urgent referral to vascular.  Attempt to get home health for further evaluation and monitoring, but no coverage.  Referral coordinator and PCP working on getting [facility] into [facility] for wound care and further assessment, discussed with patient daughter and patient.

## 2021-04-18 NOTE — Progress Notes (Addendum)
BP (!) 144/86 (BP Location: Left Arm, Patient Position: Sitting, Howell Size: Normal)    Pulse 80 Comment: apical   Temp 97.8 F (36.6 C) (Oral)    Resp 20    Wt 298 lb (135.2 kg)    SpO2 98%    BMI 37.25 kg/m    Subjective:    [name], male    DOB: 05-26-1960, 61 y.o.   MRN: [id number]  HPI: Adam Howell is a 61 y.o. male  Chief Complaint  Patient presents with   Wound Check    Patient is here for a wound check. Patient states that they had just cleaned his dressing and changed it as they informed him that his wound was smelling. Patient denies having any concerns at today's visit.    5x3 wound care and vascular  WOUND CHECK: Here today for wound check.  Wound was changed in dialysis today and started "gushing" blood + had an odor.  Wound with odor which is why they changed dressing per patient.  Ongoing issue with wounds -- was to see [facility] in November 2021 for similar issue -- he reports when he went back to [place] for a year they did operation on it and cleaned it out + had home health coming in for wound care.  Recently ordered wound care for in home, but they have not started yet.  Duration: > 1 year Location:  left lower leg Painful:  irritation Itching:  sometimes Onset: sudden Context: not changing Associated signs and symptoms: none History of skin cancer: no History of precancerous skin lesions: no Family history of skin cancer: no  Relevant past medical, surgical, family and social history reviewed and updated as indicated. Interim medical history since our last visit reviewed. Allergies and medications reviewed and updated.  Review of Systems  Constitutional:  Negative for activity change, diaphoresis, fatigue and fever.  Respiratory:  Negative for cough, chest tightness, shortness of breath and wheezing.   Cardiovascular:  Negative for chest pain, palpitations and leg swelling.  Gastrointestinal: Negative.   Endocrine: Negative for  polydipsia, polyphagia and polyuria.  Skin:  Positive for wound.  Neurological: Negative.   Psychiatric/Behavioral: Negative.     Per HPI unless specifically indicated above     Objective:    BP (!) 144/86 (BP Location: Left Arm, Patient Position: Sitting, Howell Size: Normal)    Pulse 80 Comment: apical   Temp 97.8 F (36.6 C) (Oral)    Resp 20    Wt 298 lb (135.2 kg)    SpO2 98%    BMI 37.25 kg/m   Wt Readings from Last 3 Encounters:  04/18/21 298 lb (135.2 kg)  04/10/21 298 lb 6.4 oz (135.4 kg)  04/08/20 296 lb 12.8 oz (134.6 kg)    Physical Exam Vitals and nursing note reviewed.  Constitutional:      General: He is awake. He is not in acute distress.    Appearance: He is well-developed and well-groomed. He is morbidly obese. He is not ill-appearing or toxic-appearing.  HENT:     Head: Normocephalic and atraumatic.     Right Ear: Hearing normal. No drainage.     Left Ear: Hearing normal. No drainage.  Eyes:     General: Lids are normal.        Right eye: No discharge.        Left eye: No discharge.     Conjunctiva/sclera: Conjunctivae normal.     Pupils: Pupils are equal,  round, and reactive to light.  Neck:     Thyroid: No thyromegaly.     Vascular: No carotid bruit.     Trachea: Trachea normal.  Cardiovascular:     Rate and Rhythm: Normal rate and regular rhythm.     Pulses:          Dorsalis pedis pulses are 1+ on the right side and 1+ on the left side.       Posterior tibial pulses are 1+ on the right side and 1+ on the left side.     Heart sounds: Normal heart sounds, S1 normal and S2 normal. No murmur heard.   No gallop.     Arteriovenous access: Left arteriovenous access is present.    Comments: Diminished pulses bilateral feet. Pulmonary:     Effort: Pulmonary effort is normal. No accessory muscle usage or respiratory distress.     Breath sounds: Normal breath sounds.  Abdominal:     General: Bowel sounds are normal. There is no distension.     Palpations:  Abdomen is soft.     Tenderness: There is no abdominal tenderness.  Musculoskeletal:        General: Normal range of motion.     Cervical back: Normal range of motion and neck supple.     Right lower leg: No edema.     Left lower leg: No edema.  Feet:     Right foot:     Protective Sensation: 10 sites tested.  8 sites sensed.     Skin integrity: Dry skin present.     Toenail Condition: Right toenails are abnormally thick.     Left foot:     Protective Sensation: 10 sites tested.  9 sites sensed.     Skin integrity: Dry skin present.     Toenail Condition: Left toenails are abnormally thick.  Skin:    General: Skin is warm and dry.     Capillary Refill: Capillary refill takes less than 2 seconds.     Findings: No rash.          Comments: Bilateral lower extremities with xerosis.    Neurological:     Mental Status: [name] and oriented to person, place, and time.     Deep Tendon Reflexes: Reflexes are normal and symmetric.  Psychiatric:        Attention and Perception: Attention normal.        Mood and Affect: Mood normal.        Speech: Speech normal.        Behavior: Behavior normal. Behavior is cooperative.        Thought Content: Thought content normal.        Judgment: Judgment normal.    Results for orders placed or performed during the hospital encounter of 04/13/21  Blood gas, arterial  Result Value Ref Range   FIO2 NASAL CANNULA    Delivery systems NASAL CANNULA    pH, Arterial 7.37 7.350 - 7.450   pCO2 arterial 57 (H) 32.0 - 48.0 mmHg   pO2, Arterial 82 (L) 83.0 - 108.0 mmHg   Bicarbonate 33.0 (H) 20.0 - 28.0 mmol/L   Acid-Base Excess 6.3 (H) 0.0 - 2.0 mmol/L   O2 Saturation 95.7 %   Patient temperature 37.0    Collection site RIGHT RADIAL    Sample type ARTERIAL DRAW    Allens test (pass/fail) PASS PASS  Comprehensive metabolic panel  Result Value Ref Range   Sodium 140 135 - 145  mmol/L   Potassium 4.3 3.5 - 5.1 mmol/L   Chloride 97 (L) 98 - 111  mmol/L   CO2 31 22 - 32 mmol/L   Glucose, Bld 140 (H) 70 - 99 mg/dL   BUN 48 (H) 6 - 20 mg/dL   Creatinine, Ser 8.67 (H) 0.61 - 1.24 mg/dL   Calcium 8.4 (L) 8.9 - 10.3 mg/dL   Total Protein 8.2 (H) 6.5 - 8.1 g/dL   Albumin 3.5 3.5 - 5.0 g/dL   AST 30 15 - 41 U/L   ALT 23 0 - 44 U/L   Alkaline Phosphatase 84 38 - 126 U/L   Total Bilirubin 1.2 0.3 - 1.2 mg/dL   GFR, Estimated 6 (L) >60 mL/min   Anion gap 12 5 - 15  CBC with Differential  Result Value Ref Range   WBC 5.9 4.0 - 10.5 K/uL   RBC 3.63 (L) 4.22 - 5.81 MIL/uL   Hemoglobin 10.4 (L) 13.0 - 17.0 g/dL   HCT 34.2 (L) 39.0 - 52.0 %   MCV 94.2 80.0 - 100.0 fL   MCH 28.7 26.0 - 34.0 pg   MCHC 30.4 30.0 - 36.0 g/dL   RDW 17.0 (H) 11.5 - 15.5 %   Platelets 116 (L) 150 - 400 K/uL   nRBC 0.5 (H) 0.0 - 0.2 %   Neutrophils Relative % 63 %   Neutro Abs 3.7 1.7 - 7.7 K/uL   Lymphocytes Relative 15 %   Lymphs Abs 0.9 0.7 - 4.0 K/uL   Monocytes Relative 11 %   Monocytes Absolute 0.6 0.1 - 1.0 K/uL   Eosinophils Relative 9 %   Eosinophils Absolute 0.6 (H) 0.0 - 0.5 K/uL   Basophils Relative 1 %   Basophils Absolute 0.1 0.0 - 0.1 K/uL   WBC Morphology MORPHOLOGY UNREMARKABLE    RBC Morphology MORPHOLOGY UNREMARKABLE    Smear Review Normal platelet morphology    Immature Granulocytes 1 %   Abs Immature Granulocytes 0.04 0.00 - 0.07 K/uL  Troponin I (High Sensitivity)  Result Value Ref Range   Troponin I (High Sensitivity) 84 (H) <18 ng/L  Troponin I (High Sensitivity)  Result Value Ref Range   Troponin I (High Sensitivity) 65 (H) <18 ng/L      Assessment & Plan:   Problem List Items Addressed This Visit       Cardiovascular and Mediastinum   Vasculitic ulcer of lower extremity ([place]) - Primary    Ongoing issue to left lower leg.  Reports he had debridement in [place] recently and it is improving.  Assessed area today and obtained culture.  Will send in Santyl to apply to wound and recommend non adherent dressing over top with  loose wrap.  Treat with Doxycycline 100 MG BID x 10 days at this time and change based on culture.  Urgent referral to vascular.  Attempt to get home health for further evaluation and monitoring, but no coverage.  Referral coordinator and PCP working on getting [facility] into [facility] for [facility] care and further assessment, discussed with patient daughter and patient.      Relevant Orders   Ambulatory referral to Vascular Surgery   Wound culture     Follow up plan: Return in about 1 week (around 04/25/2021) for Wound check.

## 2021-04-18 NOTE — Telephone Encounter (Signed)
Appt scheduled today @ 1 pm

## 2021-04-23 ENCOUNTER — Other Ambulatory Visit: Payer: Self-pay | Admitting: Nurse Practitioner

## 2021-04-23 LAB — WOUND CULTURE: Organism ID, Bacteria: NONE SEEN

## 2021-04-23 MED ORDER — AMOXICILLIN-POT CLAVULANATE 500-125 MG PO TABS: 1.0000 | ORAL_TABLET | Freq: Two times a day (BID) | ORAL | 0 refills | Status: AC

## 2021-04-23 NOTE — Progress Notes (Signed)
Please let Dimetri know his culture on the wound has returned.  I am going to have him stop his Doxycycline and start Augmentin which the bacteria is susceptible to.  Please start this today.  Monitor wound closely and I will see you back as scheduled.  Any questions?

## 2021-05-06 ENCOUNTER — Ambulatory Visit: Payer: Medicare Other | Admitting: Nurse Practitioner

## 2021-05-08 ENCOUNTER — Telehealth: Payer: Self-pay

## 2021-05-08 NOTE — Telephone Encounter (Signed)
Left a message for patient to give our office a call back to discuss Jolene's previous message.   OK for [facility] to give provider's note if patient calls back.

## 2021-05-08 NOTE — Telephone Encounter (Signed)
-----   Message from Venita Lick, NP sent at 05/06/2021  5:24 PM EST ----- Please call and check on patient -- he has missed appointment x 2 and has wound on leg.  Alert him I want to ensure he makes these follow-up visits as we need to make sure the leg continues to heal as he is high risk for infection.  See if home health is coming into home.  Alert him I do want him to reschedule so I can see wound.

## 2021-05-09 ENCOUNTER — Ambulatory Visit (INDEPENDENT_AMBULATORY_CARE_PROVIDER_SITE_OTHER): Payer: Medicare Other | Admitting: Nurse Practitioner

## 2021-05-09 ENCOUNTER — Encounter: Payer: Self-pay | Admitting: Nurse Practitioner

## 2021-05-09 ENCOUNTER — Telehealth: Payer: Self-pay

## 2021-05-09 ENCOUNTER — Other Ambulatory Visit: Payer: Self-pay

## 2021-05-09 VITALS — BP 143/79 | HR 90 | Temp 98.4°F | Ht 75.0 in | Wt 267.0 lb

## 2021-05-09 DIAGNOSIS — L97909 Non-pressure chronic ulcer of unspecified part of unspecified lower leg with unspecified severity: Secondary | ICD-10-CM | POA: Diagnosis not present

## 2021-05-09 MED ORDER — REGENECARE HA 2 % EX GEL: 1.0000 "application " | Freq: Every day | CUTANEOUS | 4 refills | Status: DC | PRN

## 2021-05-09 MED ORDER — SKINTEGRITY WOUND EX LIQD: 1.0000 "application " | Freq: Every day | CUTANEOUS | 3 refills | Status: DC

## 2021-05-09 MED ORDER — SANTYL 250 UNIT/GM EX OINT: 1.0000 "application " | TOPICAL_OINTMENT | Freq: Two times a day (BID) | CUTANEOUS | 4 refills | Status: DC

## 2021-05-09 NOTE — Assessment & Plan Note (Signed)
Ongoing issue to left lower leg.  Reports he had debridement in [place] and it is improving.  Assessed area today and is improving.  Will continue Santyl to apply to wound and recommend non adherent dressing over top with loose wrap.  Urgent referral to vascular placed last visit, will check on this and place wound care referral.  Attempt to get home health for further evaluation and monitoring, but no coverage.  May need assist with transportation.  Return in 2 weeks.

## 2021-05-09 NOTE — Progress Notes (Signed)
BP (!) 143/79    Pulse 90    Temp 98.4 F (36.9 C) (Oral)    Ht 6\' 3"  (1.905 m)    Wt 267 lb (121.1 kg)    SpO2 (!) 85%    BMI 33.37 kg/m    Subjective:    [name], male    DOB: 02/24/1961, 61 y.o.   MRN: [id number]  HPI: Adam Howell is a 62 y.o. male  Chief Complaint  Patient presents with   Wound Check    Patient states his wound is healing good.    Medication Refill    Patient is requesting refills on his wound care supplies and would like to discuss with provider.    WOUND CHECK: Here today for wound check, at last visit on 04/18/21 started Santyl to wound.  Ordered home health on 04/10/21 and vascular referral on 04/18/21.  Did have dialysis today and had fluid removed, is down weight on check.  He did not hear from home health or vascular.  Treated with Augmentin recent visit after culture obtained.   Ongoing issue with wounds -- was to see [facility] in November 2021 for similar issue -- he reports when he went back to [place] for a year they did operation on it and cleaned it out + had home health coming in for wound care.  Duration: > 1 year Location:  left lower leg Painful:  irritation Itching:  sometimes Onset: sudden Context: not changing Associated signs and symptoms: none History of skin cancer: no History of precancerous skin lesions: no Family history of skin cancer: no  Relevant past medical, surgical, family and social history reviewed and updated as indicated. Interim medical history since our last visit reviewed. Allergies and medications reviewed and updated.  Review of Systems  Constitutional:  Negative for activity change, diaphoresis, fatigue and fever.  Respiratory:  Negative for cough, chest tightness, shortness of breath and wheezing.   Cardiovascular:  Negative for chest pain, palpitations and leg swelling.  Gastrointestinal: Negative.   Endocrine: Negative for polydipsia, polyphagia and polyuria.  Skin:  Positive for wound.   Neurological: Negative.   Psychiatric/Behavioral: Negative.     Per HPI unless specifically indicated above     Objective:    BP (!) 143/79    Pulse 90    Temp 98.4 F (36.9 C) (Oral)    Ht 6\' 3"  (1.905 m)    Wt 267 lb (121.1 kg)    SpO2 (!) 85%    BMI 33.37 kg/m   Wt Readings from Last 3 Encounters:  05/09/21 267 lb (121.1 kg)  04/18/21 298 lb (135.2 kg)  04/10/21 298 lb 6.4 oz (135.4 kg)    Physical Exam Vitals and nursing note reviewed.  Constitutional:      General: He is awake. He is not in acute distress.    Appearance: He is well-developed and well-groomed. He is morbidly obese. He is not ill-appearing or toxic-appearing.  HENT:     Head: Normocephalic and atraumatic.     Right Ear: Hearing normal. No drainage.     Left Ear: Hearing normal. No drainage.  Eyes:     General: Lids are normal.        Right eye: No discharge.        Left eye: No discharge.     Conjunctiva/sclera: Conjunctivae normal.     Pupils: Pupils are equal, round, and reactive to light.  Neck:     Thyroid: No thyromegaly.  Vascular: No carotid bruit.     Trachea: Trachea normal.  Cardiovascular:     Rate and Rhythm: Normal rate and regular rhythm.     Pulses:          Dorsalis pedis pulses are 1+ on the right side and 1+ on the left side.       Posterior tibial pulses are 1+ on the right side and 1+ on the left side.     Heart sounds: Normal heart sounds, S1 normal and S2 normal. No murmur heard.   No gallop.     Arteriovenous access: Left arteriovenous access is present.    Comments: Diminished pulses bilateral feet. Pulmonary:     Effort: Pulmonary effort is normal. No accessory muscle usage or respiratory distress.     Breath sounds: Normal breath sounds.  Abdominal:     General: Bowel sounds are normal. There is no distension.     Palpations: Abdomen is soft.     Tenderness: There is no abdominal tenderness.  Musculoskeletal:        General: Normal range of motion.     Cervical  back: Normal range of motion and neck supple.     Right lower leg: No edema.     Left lower leg: No edema.  Feet:     Right foot:     Protective Sensation: 10 sites tested.  8 sites sensed.     Skin integrity: Dry skin present.     Toenail Condition: Right toenails are abnormally thick.     Left foot:     Protective Sensation: 10 sites tested.  9 sites sensed.     Skin integrity: Dry skin present.     Toenail Condition: Left toenails are abnormally thick.  Skin:    General: Skin is warm and dry.     Capillary Refill: Capillary refill takes less than 2 seconds.     Findings: No rash.          Comments: Bilateral lower extremities with xerosis.    Neurological:     Mental Status: [name] and oriented to person, place, and time.     Deep Tendon Reflexes: Reflexes are normal and symmetric.  Psychiatric:        Attention and Perception: Attention normal.        Mood and Affect: Mood normal.        Speech: Speech normal.        Behavior: Behavior normal. Behavior is cooperative.        Thought Content: Thought content normal.        Judgment: Judgment normal.    Results for orders placed or performed in visit on 04/18/21  Wound culture   Specimen: Wound   WO  Result Value Ref Range   Gram Stain Result Final report    Organism ID, Bacteria Comment    Organism ID, Bacteria No organisms seen    Aerobic Bacterial Culture Final report (A)    Organism ID, Bacteria Citrobacter koseri (A)    Antimicrobial Susceptibility Comment       Assessment & Plan:   Problem List Items Addressed This Visit       Cardiovascular and Mediastinum   Vasculitic ulcer of lower extremity ([place]) - Primary    Ongoing issue to left lower leg.  Reports he had debridement in [place] and it is improving.  Assessed area today and is improving.  Will continue Santyl to apply to wound and recommend non adherent dressing  over top with loose wrap.  Urgent referral to vascular placed last visit, will check on  this and place wound care referral.  Attempt to get home health for further evaluation and monitoring, but no coverage.  May need assist with transportation.  Return in 2 weeks.      Relevant Orders   AMB referral to wound care center     Follow up plan: Return in about 2 weeks (around 05/23/2021) for Wound Check.

## 2021-05-09 NOTE — Telephone Encounter (Signed)
-----   Message from Venita Lick, NP sent at 05/06/2021  5:24 PM EST ----- Please call and check on patient -- he has missed appointment x 2 and has wound on leg.  Alert him I want to ensure he makes these follow-up visits as we need to make sure the leg continues to heal as he is high risk for infection.  See if home health is coming into home.  Alert him I do want him to reschedule so I can see wound.

## 2021-05-09 NOTE — Telephone Encounter (Signed)
Patient is scheduled for an appointment today at 05/09/21 at 2:20 PM.

## 2021-05-09 NOTE — Patient Instructions (Signed)
Wound Care, Adult ?Taking care of your wound properly can help to prevent pain, infection, and scarring. It can also help your wound heal more quickly. Follow instructions from your health care provider about how to care for your wound. ?Supplies needed: ?Soap and water. ?Wound cleanser, saline, or germ-free (sterile) water. ?Gauze. ?If needed, a clean bandage (dressing) or other type of wound dressing material to cover or place in the wound. Follow your health care provider's instructions about what dressing supplies to use. ?Cream or topical ointment to apply to the wound, if told by your health care provider. ?How to care for your wound ?Cleaning the wound ?Ask your health care provider how to clean the wound. This may include: ?Using mild soap and water, a wound cleanser, saline, or sterile water. ?Using a clean gauze to pat the wound dry after cleaning it. Do not rub or scrub the wound. ?Dressing care ?Wash your hands with soap and water for at least 20 seconds before and after you change the dressing. If soap and water are not available, use hand sanitizer. ?Change your dressing as told by your health care provider. This may include: ?Cleaning or rinsing out (irrigating) the wound. ?Application of cream or topical ointment, if told by your health care provider. ?Placing a dressing over the wound or in the wound (packing). ?Covering the wound with an outer dressing. ?Leave stitches (sutures), staples, skin glue, or adhesive strips in place. These skin closures may need to stay in place for 2 weeks or longer. If adhesive strip edges start to loosen and curl up, you may trim the loose edges. Do not remove adhesive strips completely unless your health care provider tells you to do that. ?Ask your health care provider when you can leave the wound uncovered. ?Checking for infection ?Check your wound area every day for signs of infection. Check for: ?More redness, swelling, or pain. ?Fluid or blood. ?Warmth. ?Pus or  a bad smell. ? ?Follow these instructions at home ?Medicines ?If you were prescribed an antibiotic medicine, cream, or ointment, take or apply it as told by your health care provider. Do not stop using the antibiotic even if your condition improves. ?If you were prescribed pain medicine, take it 30 minutes before you do any wound care or as told by your health care provider. ?Take over-the-counter and prescription medicines only as told by your health care provider. ?Eating and drinking ?Eat a diet that includes protein, vitamin A, vitamin C, and other nutrient-rich foods to help the wound heal. ?Foods rich in protein include meat, fish, eggs, dairy, beans, and nuts. ?Foods rich in vitamin A include carrots and dark green, leafy vegetables. ?Foods rich in vitamin C include citrus fruits, tomatoes, broccoli, and peppers. ?Drink enough fluid to keep your urine pale yellow. ?General instructions ?Do not take baths, swim, or use a hot tub until your health care provider approves. Ask your health care provider if you may take showers. You may only be allowed to take sponge baths. ?Do not scratch or pick at the wound. Keep it covered as told by your health care provider. ?Return to your normal activities as told by your health care provider. Ask your health care provider what activities are safe for you. ?Protect your wound from the sun when you are outside for the first 6 months, or for as long as told by your health care provider. Cover up the scar area or apply sunscreen that has an SPF of at least 30. ?Do not   use any products that contain nicotine or tobacco. These products include cigarettes, chewing tobacco, and vaping devices, such as e-cigarettes. If you need help quitting, ask your health care provider. ?Keep all follow-up visits. This is important. ?Contact a health care provider if: ?You received a tetanus shot and you have swelling, severe pain, redness, or bleeding at the injection site. ?Your pain is not  controlled with medicine. ?You have any of these signs of infection: ?More redness, swelling, or pain around the wound. ?Fluid or blood coming from the wound. ?Warmth coming from the wound. ?A fever or chills. ?You are nauseous or you vomit. ?You are dizzy. ?You have a new rash or hardness around the wound. ?Get help right away if: ?You have a red streak of skin near the area around your wound. ?Pus or a bad smell coming from the wound. ?Your wound has been closed with staples, sutures, skin glue, or adhesive strips and it begins to open up and separate. ?Your wound is bleeding, and the bleeding does not stop with gentle pressure. ?These symptoms may represent a serious problem that is an emergency. Do not wait to see if the symptoms will go away. Get medical help right away. Call your local emergency services (911 in the [place] Do not drive yourself to the hospital. ?Summary ?Always wash your hands with soap and water for at least 20 seconds before and after changing your dressing. ?Change your dressing as told by your health care provider. ?To help with healing, eat foods that are rich in protein, vitamin A, vitamin C, and other nutrients. ?Check your wound every day for signs of infection. Contact your health care provider if you think that your wound is infected. ?This information is not intended to replace advice given to you by your health care provider. Make sure you discuss any questions you have with your health care provider. ?Document Revised: 07/09/2020 Document Reviewed: 07/09/2020 ?Elsevier Patient Education ? 2022 [employer] ? ?

## 2021-05-27 ENCOUNTER — Encounter: Payer: Medicare Other | Attending: Internal Medicine | Admitting: Internal Medicine

## 2021-05-27 ENCOUNTER — Other Ambulatory Visit: Payer: Self-pay

## 2021-05-27 DIAGNOSIS — Z992 Dependence on renal dialysis: Secondary | ICD-10-CM | POA: Diagnosis not present

## 2021-05-27 DIAGNOSIS — I5020 Unspecified systolic (congestive) heart failure: Secondary | ICD-10-CM | POA: Diagnosis not present

## 2021-05-27 DIAGNOSIS — Z09 Encounter for follow-up examination after completed treatment for conditions other than malignant neoplasm: Secondary | ICD-10-CM | POA: Diagnosis not present

## 2021-05-27 DIAGNOSIS — I5022 Chronic systolic (congestive) heart failure: Secondary | ICD-10-CM | POA: Diagnosis not present

## 2021-05-27 DIAGNOSIS — N186 End stage renal disease: Secondary | ICD-10-CM | POA: Insufficient documentation

## 2021-05-27 DIAGNOSIS — E1122 Type 2 diabetes mellitus with diabetic chronic kidney disease: Secondary | ICD-10-CM | POA: Diagnosis not present

## 2021-05-27 DIAGNOSIS — E11622 Type 2 diabetes mellitus with other skin ulcer: Secondary | ICD-10-CM | POA: Diagnosis present

## 2021-05-27 DIAGNOSIS — L97822 Non-pressure chronic ulcer of other part of left lower leg with fat layer exposed: Secondary | ICD-10-CM | POA: Insufficient documentation

## 2021-05-28 ENCOUNTER — Ambulatory Visit: Payer: Medicare Other | Admitting: Nurse Practitioner

## 2021-05-29 ENCOUNTER — Telehealth: Payer: Self-pay | Admitting: Nurse Practitioner

## 2021-05-29 DIAGNOSIS — E1122 Type 2 diabetes mellitus with diabetic chronic kidney disease: Secondary | ICD-10-CM

## 2021-05-29 DIAGNOSIS — I739 Peripheral vascular disease, unspecified: Secondary | ICD-10-CM

## 2021-05-29 DIAGNOSIS — L97909 Non-pressure chronic ulcer of unspecified part of unspecified lower leg with unspecified severity: Secondary | ICD-10-CM

## 2021-05-29 NOTE — Telephone Encounter (Signed)
Copied from [place] [id number]. Topic: Referral - Status ?>> May 29, 2021  2:56 PM Greggory Keen D wrote: ?Pt called asking about a referral to the podiatrist for clipping his toenails/ ? ?CB# [phone] ?

## 2021-05-30 NOTE — Telephone Encounter (Signed)
Reached out to patient about referral to let him know referral was sent in. Left VM.  ?

## 2021-06-10 ENCOUNTER — Ambulatory Visit (INDEPENDENT_AMBULATORY_CARE_PROVIDER_SITE_OTHER): Payer: Medicare Other | Admitting: Podiatry

## 2021-06-10 ENCOUNTER — Encounter: Payer: Self-pay | Admitting: Podiatry

## 2021-06-10 ENCOUNTER — Other Ambulatory Visit: Payer: Self-pay

## 2021-06-10 DIAGNOSIS — M79675 Pain in left toe(s): Secondary | ICD-10-CM

## 2021-06-10 DIAGNOSIS — M79674 Pain in right toe(s): Secondary | ICD-10-CM | POA: Diagnosis not present

## 2021-06-10 DIAGNOSIS — E119 Type 2 diabetes mellitus without complications: Secondary | ICD-10-CM | POA: Diagnosis not present

## 2021-06-10 DIAGNOSIS — B351 Tinea unguium: Secondary | ICD-10-CM

## 2021-06-10 DIAGNOSIS — E0843 Diabetes mellitus due to underlying condition with diabetic autonomic (poly)neuropathy: Secondary | ICD-10-CM

## 2021-06-10 NOTE — Progress Notes (Signed)
? ?  SUBJECTIVE ?Patient with a history of diabetes mellitus presents to office today complaining of elongated, thickened nails that cause pain while ambulating in shoes.  He is unable to trim his own nails.  ? ?Past Medical History:  ?Diagnosis Date  ? Chronic kidney disease   ? Congestive heart failure ([place])   ? Diabetes mellitus without complication ([place])   ? Hyperlipidemia   ? Hypertension   ? ? ?OBJECTIVE ?General Patient is awake, alert, and oriented x 3 and in no acute distress. ?Derm Skin is dry and supple bilateral. Negative open lesions or macerations. Remaining integument unremarkable. Nails are tender, long, thickened and dystrophic with subungual debris, consistent with onychomycosis, 1-5 bilateral. No signs of infection noted. ?Vasc  DP and PT pedal pulses palpable bilaterally. Temperature gradient within normal limits.  ?Neuro Epicritic and protective threshold sensation diminished bilaterally.  ?Musculoskeletal Exam No symptomatic pedal deformities noted bilateral. Muscular strength within normal limits.  There is some pain on palpation along the Achilles tendon bilateral lower extremities. ? ?ASSESSMENT ?1. Diabetes Mellitus w/ peripheral neuropathy ?2.  Pain due to onychomycosis of toenails both ? ?PLAN OF CARE ?1. Patient evaluated today.  Comprehensive diabetic foot exam performed today ?2. Instructed to maintain good pedal hygiene and foot care. Stressed importance of controlling blood sugar.  ?3. Mechanical debridement of nails 1-5 bilaterally performed using a nail nipper. Filed with dremel without incident.  ?4.  Continue gabapentin as per prescribing physician  ?5.  Return to clinic in 3 mos.  ? ? ? ?Edrick Kins, DPM ?[facility] ? ?Dr. Edrick Kins, DPM  ?  ?[facility]                                        ?[place], [place] [address]                ?Office [phone]  ?Fax [phone] ? ? ? ? ? ?

## 2021-06-11 ENCOUNTER — Encounter (HOSPITAL_BASED_OUTPATIENT_CLINIC_OR_DEPARTMENT_OTHER): Payer: Medicare Other | Admitting: Internal Medicine

## 2021-06-11 DIAGNOSIS — I5020 Unspecified systolic (congestive) heart failure: Secondary | ICD-10-CM | POA: Diagnosis not present

## 2021-06-11 DIAGNOSIS — E11622 Type 2 diabetes mellitus with other skin ulcer: Secondary | ICD-10-CM | POA: Diagnosis not present

## 2021-06-11 DIAGNOSIS — L97822 Non-pressure chronic ulcer of other part of left lower leg with fat layer exposed: Secondary | ICD-10-CM

## 2021-06-11 DIAGNOSIS — N186 End stage renal disease: Secondary | ICD-10-CM

## 2021-06-11 DIAGNOSIS — Z09 Encounter for follow-up examination after completed treatment for conditions other than malignant neoplasm: Secondary | ICD-10-CM | POA: Diagnosis not present

## 2021-06-11 NOTE — Progress Notes (Signed)
ROMAIN, ERION ([id number]) ?Visit Report for 06/11/2021 ?Arrival Information Details ?Patient Name: Adam Howell, Adam Howell. ?Date of Service: 06/11/2021 1:15 PM ?Medical Record Number: [id number] ?Patient Account Number: [id number] ?Date of Birth/Sex: [birth year] (61 y.o. M) ?Treating RN: Carlene Coria ?Primary Care Sanna Porcaro: [name] Other Clinician: ?Referring Sekou Zuckerman: [name] ?Treating Francesca Strome/Extender: Kalman Shan ?Weeks in Treatment: 2 ?Visit Information History Since Last Visit ?All ordered tests and consults were completed: No ?Patient Arrived: Ambulatory ?Added or deleted any medications: No ?Arrival Time: 13:18 ?Any new allergies or adverse reactions: No ?Accompanied By: self ?Had a fall or experienced change in No ?Transfer Assistance: None ?activities of daily living that may affect ?Patient Identification Verified: Yes ?risk of falls: ?Secondary Verification Process Completed: Yes ?Signs or symptoms of abuse/neglect since last visito No ?Patient Requires Transmission-Based No ?Hospitalized since last visit: No ?Precautions: ?Implantable device outside of the clinic excluding No ?Patient Has Alerts: Yes ?cellular tissue based products placed in the center ?Patient Alerts: Patient on Blood ?since last visit: ?Thinner ?Has Dressing in Place as Prescribed: Yes ?DIABETIC ?Pain Present Now: No ?Electronic Signature(s) ?Signed: 06/11/2021 4:09:56 PM By: Carlene Coria RN ?Entered By: Carlene Coria on 06/11/2021 13:22:10 ?THAI, BURGUENO ([id number]) ?-------------------------------------------------------------------------------- ?Clinic Level of Care Assessment Details ?Patient Name: Adam Howell, Adam Howell. ?Date of Service: 06/11/2021 1:15 PM ?Medical Record Number: [id number] ?Patient Account Number: [id number] ?Date of Birth/Sex: March 28, 1960 (61 y.o. M) ?Treating RN: Carlene Coria ?Primary Care Taliah Porche: [name] Other Clinician: ?Referring Jenina Moening: [name] ?Treating Bader Stubblefield/Extender:  Kalman Shan ?Weeks in Treatment: 2 ?Clinic Level of Care Assessment Items ?TOOL 4 Quantity Score ?X - Use when only an EandM is performed on FOLLOW-UP visit 1 0 ?ASSESSMENTS - Nursing Assessment / Reassessment ?X - Reassessment of Co-morbidities (includes updates in patient status) 1 10 ?X- 1 5 ?Reassessment of Adherence to Treatment Plan ?ASSESSMENTS - Wound and Skin Assessment / Reassessment ?X - Simple Wound Assessment / Reassessment - one wound 1 5 ?[]  - 0 ?Complex Wound Assessment / Reassessment - multiple wounds ?[]  - 0 ?Dermatologic / Skin Assessment (not related to wound area) ?ASSESSMENTS - Focused Assessment ?[]  - Circumferential Edema Measurements - multi extremities 0 ?[]  - 0 ?Nutritional Assessment / Counseling / Intervention ?[]  - 0 ?Lower Extremity Assessment (monofilament, tuning fork, pulses) ?[]  - 0 ?Peripheral Arterial Disease Assessment (using hand held doppler) ?ASSESSMENTS - Ostomy and/or Continence Assessment and Care ?[]  - Incontinence Assessment and Management 0 ?[]  - 0 ?Ostomy Care Assessment and Management (repouching, etc.) ?PROCESS - Coordination of Care ?X - Simple Patient / Family Education for ongoing care 1 15 ?[]  - 0 ?Complex (extensive) Patient / Family Education for ongoing care ?[]  - 0 ?Staff obtains Consents, Records, Test Results / Process Orders ?[]  - 0 ?Staff telephones HHA, Nursing Homes / Clarify orders / etc ?[]  - 0 ?Routine Transfer to another Facility (non-emergent condition) ?[]  - 0 ?Routine Hospital Admission (non-emergent condition) ?[]  - 0 ?New Admissions / Biomedical engineer / Ordering NPWT, Apligraf, etc. ?[]  - 0 ?Emergency Hospital Admission (emergent condition) ?X- 1 10 ?Simple Discharge Coordination ?[]  - 0 ?Complex (extensive) Discharge Coordination ?PROCESS - Special Needs ?[]  - Pediatric / Minor Patient Management 0 ?[]  - 0 ?Isolation Patient Management ?[]  - 0 ?Hearing / Language / Visual special needs ?[]  - 0 ?Assessment of Community assistance  (transportation, D/C planning, etc.) ?[]  - 0 ?Additional assistance / Altered mentation ?[]  - 0 ?Support Surface(s) Assessment (bed, cushion, seat, etc.) ?INTERVENTIONS - Wound Cleansing / Measurement ?Adam Howell, Adam Howell ([id number]) ?  X- 1 5 ?Simple Wound Cleansing - one wound ?[]  - 0 ?Complex Wound Cleansing - multiple wounds ?X- 1 5 ?Wound Imaging (photographs - any number of wounds) ?[]  - 0 ?Wound Tracing (instead of photographs) ?X- 1 5 ?Simple Wound Measurement - one wound ?[]  - 0 ?Complex Wound Measurement - multiple wounds ?INTERVENTIONS - Wound Dressings ?[]  - Small Wound Dressing one or multiple wounds 0 ?[]  - 0 ?Medium Wound Dressing one or multiple wounds ?[]  - 0 ?Large Wound Dressing one or multiple wounds ?[]  - 0 ?Application of Medications - topical ?[]  - 0 ?Application of Medications - injection ?INTERVENTIONS - Miscellaneous ?[]  - External ear exam 0 ?[]  - 0 ?Specimen Collection (cultures, biopsies, blood, body fluids, etc.) ?[]  - 0 ?Specimen(s) / Culture(s) sent or taken to Lab for analysis ?[]  - 0 ?Patient Transfer (multiple staff / Civil Service fast streamer / Similar devices) ?[]  - 0 ?Simple Staple / Suture removal (25 or less) ?[]  - 0 ?Complex Staple / Suture removal (26 or more) ?[]  - 0 ?Hypo / Hyperglycemic Management (close monitor of Blood Glucose) ?[]  - 0 ?Ankle / Brachial Index (ABI) - do not check if billed separately ?X- 1 5 ?Vital Signs ?Has the patient been seen at the hospital within the last three years: Yes ?Total Score: 65 ?Level Of Care: New/Established - Level ?2 ?Electronic Signature(s) ?Signed: 06/11/2021 4:09:56 PM By: Carlene Coria RN ?Entered By: Carlene Coria on 06/11/2021 13:45:02 ?Adam Howell, Adam Howell ([id number]) ?-------------------------------------------------------------------------------- ?Encounter Discharge Information Details ?Patient Name: Adam Howell, Adam Howell. ?Date of Service: 06/11/2021 1:15 PM ?Medical Record Number: [id number] ?Patient Account Number: [id number] ?Date of Birth/Sex:  03-12-1961 (61 y.o. M) ?Treating RN: Carlene Coria ?Primary Care Zi Newbury: [name] Other Clinician: ?Referring Ambar Raphael: [name] ?Treating Anette Barra/Extender: Kalman Shan ?Weeks in Treatment: 2 ?Encounter Discharge Information Items ?Discharge Condition: Stable ?Ambulatory Status: Ambulatory ?Discharge Destination: Home ?Transportation: Private Auto ?Accompanied By: self ?Schedule Follow-up Appointment: Yes ?Clinical Summary of Care: Patient Declined ?Electronic Signature(s) ?Signed: 06/11/2021 1:46:19 PM By: Carlene Coria RN ?Entered By: Carlene Coria on 06/11/2021 13:46:19 ?Adam Howell, Adam Howell ([id number]) ?-------------------------------------------------------------------------------- ?Lower Extremity Assessment Details ?Patient Name: Adam Howell, Adam Howell. ?Date of Service: 06/11/2021 1:15 PM ?Medical Record Number: [id number] ?Patient Account Number: [id number] ?Date of Birth/Sex: 01/13/1961 (61 y.o. M) ?Treating RN: Carlene Coria ?Primary Care Kayin Kettering: [name] Other Clinician: ?Referring Harsh Trulock: [name] ?Treating Vora Clover/Extender: Kalman Shan ?Weeks in Treatment: 2 ?Edema Assessment ?Assessed: [Left: No] [Right: No] ?Edema: [Left: N] [Right: o] ?Calf ?Left: Right: ?Point of Measurement: 40 cm From Medial Instep 39 cm ?Ankle ?Left: Right: ?Point of Measurement: 12 cm From Medial Instep 24 cm ?Knee To Floor ?Left: Right: ?From Medial Instep 50 cm ?Vascular Assessment ?Pulses: ?Dorsalis Pedis ?Palpable: [Left:Yes] ?Electronic Signature(s) ?Signed: 06/11/2021 1:28:37 PM By: Carlene Coria RN ?Entered ByCarlene Coria on 06/11/2021 13:28:37 ?Adam Howell, Adam Howell ([id number]) ?-------------------------------------------------------------------------------- ?Multi Wound Chart Details ?Patient Name: Adam Howell, Adam Howell. ?Date of Service: 06/11/2021 1:15 PM ?Medical Record Number: [id number] ?Patient Account Number: [id number] ?Date of Birth/Sex: 1960/12/17 (61 y.o. M) ?Treating RN: Carlene Coria ?Primary Care Kadija Cruzen: [name] Other Clinician: ?Referring Manali Mcelmurry: [name] ?Treating Troi Bechtold/Extender: Kalman Shan ?Weeks in Treatment: 2 ?Vital Signs ?Height(in): 75 ?Pulse(bpm

## 2021-06-11 NOTE — Progress Notes (Signed)
Adam, Howell ([id number]) ?Visit Report for 06/11/2021 ?Chief Complaint Document Details ?Patient Name: Adam Howell, Adam Howell. ?Date of Service: 06/11/2021 1:15 PM ?Medical Record Number: [id number] ?Patient Account Number: [id number] ?Date of Birth/Sex: 1960-06-09 (61 y.o. M) ?Treating RN: Carlene Coria ?Primary Care Provider: [name] Other Clinician: ?Referring Provider: [name] ?Treating Provider/Extender: Kalman Shan ?Weeks in Treatment: 2 ?Information Obtained from: Patient ?Chief Complaint ?Left lower extremity wound ?Electronic Signature(s) ?Signed: 06/11/2021 2:12:19 PM By: Kalman Shan DO ?Entered By: Kalman Shan on 06/11/2021 14:09:24 ?EGBERT, SEIDEL ([id number]) ?-------------------------------------------------------------------------------- ?HPI Details ?Patient Name: Adam, Howell. ?Date of Service: 06/11/2021 1:15 PM ?Medical Record Number: [id number] ?Patient Account Number: [id number] ?Date of Birth/Sex: Jun 01, 1960 (61 y.o. M) ?Treating RN: Carlene Coria ?Primary Care Provider: [name] Other Clinician: ?Referring Provider: [name] ?Treating Provider/Extender: Kalman Shan ?Weeks in Treatment: 2 ?History of Present Illness ?HPI Description: Admission 05/27/2021 ?Mr. Keeyon Privitera is a 61 year old male with a past medical history of end-stage renal disease on hemodialysis Monday Wednesday Friday, type ?2 diabetes, HFrEF that presents to the clinic for a 1 year history of ulcer to his left lower extremity. He states it happened spontaneously. He is ?currently he states for the past year it has been healing. He is currently using Santyl ointment to the area. He currently denies signs of infection. ?3/29; patient presents for follow-up. He has been using Santyl to the wound bed. He has no issues or complaints today. He reports improvement ?in wound healing. ?Electronic Signature(s) ?Signed: 06/11/2021 2:12:19 PM By: Kalman Shan DO ?Entered By:  Kalman Shan on 06/11/2021 14:09:43 ?ALPHUS, ZECK ([id number]) ?-------------------------------------------------------------------------------- ?Physical Exam Details ?Patient Name: Adam, Howell. ?Date of Service: 06/11/2021 1:15 PM ?Medical Record Number: [id number] ?Patient Account Number: [id number] ?Date of Birth/Sex: [birth year] (61 y.o. M) ?Treating RN: Carlene Coria ?Primary Care Provider: [name] Other Clinician: ?Referring Provider: [name] ?Treating Provider/Extender: Kalman Shan ?Weeks in Treatment: 2 ?Constitutional ?. ?Cardiovascular ?Marland Kitchen ?Psychiatric ?Marland Kitchen ?Notes ?Left lower extremity: Epithelization to the previous wound site. No surrounding signs of infection. ?Electronic Signature(s) ?Signed: 06/11/2021 2:12:19 PM By: Kalman Shan DO ?Entered By: Kalman Shan on 06/11/2021 14:10:06 ?KERIC, ZEHREN ([id number]) ?-------------------------------------------------------------------------------- ?Physician Orders Details ?Patient Name: Adam, Howell. ?Date of Service: 06/11/2021 1:15 PM ?Medical Record Number: [id number] ?Patient Account Number: [id number] ?Date of Birth/Sex: 1960-12-20 (61 y.o. M) ?Treating RN: Carlene Coria ?Primary Care Provider: [name] Other Clinician: ?Referring Provider: [name] ?Treating Provider/Extender: Kalman Shan ?Weeks in Treatment: 2 ?Verbal / Phone Orders: No ?Diagnosis Coding ?Discharge From [facility] Services ?o Discharge from [place] Treatment Complete - apply lotion daily ?Electronic Signature(s) ?Signed: 06/11/2021 2:12:19 PM By: Kalman Shan DO ?Previous Signature: 06/11/2021 1:44:38 PM Version By: Carlene Coria RN ?Entered By: Kalman Shan on 06/11/2021 14:11:45 ?ANDREW, BLASIUS ([id number]) ?-------------------------------------------------------------------------------- ?Problem List Details ?Patient Name: Adam, Howell. ?Date of Service: 06/11/2021 1:15 PM ?Medical Record Number:  [id number] ?Patient Account Number: [id number] ?Date of Birth/Sex: Apr 15, 1960 (61 y.o. M) ?Treating RN: Carlene Coria ?Primary Care Provider: [name] Other Clinician: ?Referring Provider: [name] ?Treating Provider/Extender: Kalman Shan ?Weeks in Treatment: 2 ?Active Problems ?ICD-10 ?Encounter ?Code Description Active Date MDM ?Diagnosis ?K48.185 Non-pressure chronic ulcer of other part of left lower leg with fat layer 05/27/2021 No Yes ?exposed ?E11.622 Type 2 diabetes mellitus with other skin ulcer 05/27/2021 No Yes ?N18.6 End stage renal disease 05/27/2021 No Yes ?I50.20 Unspecified systolic (congestive) heart failure 05/27/2021 No Yes ?Inactive Problems ?Resolved Problems ?Electronic Signature(s) ?Signed: 06/11/2021 2:12:19 PM By:  Kalman Shan DO ?Entered By: Kalman Shan on 06/11/2021 14:09:19 ?KAZI, REPPOND ([id number]) ?-------------------------------------------------------------------------------- ?Progress Note Details ?Patient Name: Adam, Howell. ?Date of Service: 06/11/2021 1:15 PM ?Medical Record Number: [id number] ?Patient Account Number: [id number] ?Date of Birth/Sex: 01-23-1961 (61 y.o. M) ?Treating RN: Carlene Coria ?Primary Care Provider: [name] Other Clinician: ?Referring Provider: [name] ?Treating Provider/Extender: Kalman Shan ?Weeks in Treatment: 2 ?Subjective ?Chief Complaint ?Information obtained from Patient ?Left lower extremity wound ?History of Present Illness (HPI) ?Admission 05/27/2021 ?Mr. Lawsen Howell is a 61 year old male with a past medical history of end-stage renal disease on hemodialysis Monday Wednesday Friday, type ?2 diabetes, HFrEF that presents to the clinic for a 1 year history of ulcer to his left lower extremity. He states it happened spontaneously. He is ?currently he states for the past year it has been healing. He is currently using Santyl ointment to the area. He currently denies signs of infection. ?3/29; patient  presents for follow-up. He has been using Santyl to the wound bed. He has no issues or complaints today. He reports improvement ?in wound healing. ?Objective ?Constitutional ?Vitals Time Taken: 1:22 PM, Height: 75 in, Weight: 250 lbs, BMI: 31.2, Temperature: 98.7 ??F, Pulse: 101 bpm, Respiratory Rate: 20 breaths/min, ?Blood Pressure: 128/83 mmHg. ?General Notes: Left lower extremity: Epithelization to the previous wound site. No surrounding signs of infection. ?Integumentary (Hair, Skin) ?Wound #1 status is Open. Original cause of wound was Gradually Appeared. The date acquired was: 03/16/2020. The wound has been in treatment ?2 weeks. The wound is located on the Left,Posterior Lower Leg. The wound measures 0cm length x 0cm width x 0cm depth; 0cm^2 area and ?0cm^3 volume. There is no tunneling noted. There is a none present amount of drainage noted. There is no granulation within the wound bed. ?There is no necrotic tissue within the wound bed. 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 The wound is closed. I recommended Vaseline nightly to the area for the next 1 to 2 weeks. He may follow-up ?as needed. ?Plan ?YURI, FANA ([id number]) ?Discharge From [facility] Services: ?Discharge from [place] Treatment Complete - apply lotion daily ?1. Discharge from clinic due to closed wound ?2. Follow-up as needed ?3. Vaseline nightly to the previous wound site for the next 1 to 2 weeks ?Electronic Signature(s) ?Signed: 06/11/2021 2:12:19 PM By: Kalman Shan DO ?Entered By: Kalman Shan on 06/11/2021 14:11:10 ?QAADIR, KENT ([id number]) ?-------------------------------------------------------------------------------- ?SuperBill Details ?Patient Name: NAZAR, KUAN. ?Date  of Service: 06/11/2021 ?Medical Record Number: [id number] ?Patient Account Number: [id number] ?Date of Birth/Sex: 07/13/1960 (61 y.o. M) ?Treating RN: Carlene Coria ?Primary Care Provider: [name] Other [facility]

## 2021-07-02 ENCOUNTER — Encounter: Payer: Self-pay | Admitting: Nurse Practitioner

## 2021-07-02 ENCOUNTER — Other Ambulatory Visit: Payer: Medicare Other | Admitting: Nurse Practitioner

## 2021-07-02 DIAGNOSIS — N186 End stage renal disease: Secondary | ICD-10-CM

## 2021-07-02 DIAGNOSIS — R0602 Shortness of breath: Secondary | ICD-10-CM

## 2021-07-02 DIAGNOSIS — Z515 Encounter for palliative care: Secondary | ICD-10-CM

## 2021-07-02 NOTE — Progress Notes (Addendum)
? ? ?Manufacturing engineer ?Community Palliative Care Consult Note ?Telephone: [phone]  ?Fax: [phone]  ? ? ?Date of encounter: 07/02/21 ?9:41 PM ?PATIENT NAME: Adam Howell ?[address] ?[place] [address]   ?[phone] (home)  ?DOB: 1961-02-23 ?MRN: [id number] ?PRIMARY CARE PROVIDER:    ?Venita Lick, NP,  ?[address] [place] [address] ?[phone] ?RESPONSIBLE PARTY:    ?Contact Information   ? ? Name Relation Home Work Mobile  ? speel,michelle Daughter [phone]  [phone]  ? ?  ? ?I met face to face with patient and family in home. Palliative Care was asked to follow this patient by consultation request of  Venita Lick, NP to address advance care planning and complex medical decision making. This is a follow up visit.                                  ?ASSESSMENT AND PLAN / RECOMMENDATIONS:  ?Symptom Management/Plan: ?1. Advance Care Planning; Full code ?UNDER PAIN CONTRACT WITH [facility] ?2. Goals of Care: Goals include to maximize quality of life and symptom management. Our advance care planning conversation included a discussion about:    ?The value and importance of advance care planning  ?Exploration of personal, cultural or spiritual beliefs that might influence medical decisions  ?Exploration of goals of care in the event of a sudden injury or illness  ?Identification and preparation of a healthcare agent  ?Review and updating or creation of an advance directive document. ?  ?3. Palliative care encounter; Palliative care encounter; Palliative medicine team will continue to support patient, patient's family, and medical team. Visit consisted of counseling and education dealing with the complex and emotionally intense issues of symptom management and palliative care in the setting of serious and potentially life-threatening illness ?  ?4. Shortness of breath currently stable secondary to ESRD, continue compliance with treatments, weights; We  talked about compliance with HD, nutrition, fluid, quality of life ?  ?5. f/u 2 month for ongoing monitoring chronic disease progression, ongoing discussions complex medical decision making ? ?Follow up Palliative Care Visit: Palliative care will continue to follow for complex medical decision making, advance care planning, and clarification of goals. Return 8 weeks or prn. ? ?I spent 45 minutes providing this consultation. More than 50% of the time in this consultation was spent in counseling and care coordination ?PPS: 50% ?Chief Complaint: Follow up palliative consult for complex medical decision making ? ?HISTORY OF PRESENT ILLNESS:  Adam Howell is a 61 y.o. year old male  with multiple medical problems including ESRD on HD days Monday, Wed, Friday, HTN, afib, CHF, vasculitis ulcer lower extremity, DM, HLD, anemia, thrombophillia, chronic pain syndrome (under pain contract at [facility] with [name] NP), insomnia, morbid obesity. I visited Adam Howell in his home following confirming by phone visit. We talked about purpose of PC visit. We talked about how Mr. Tomb was feeling today. Adam Howell endorses he just returned from HD. We talked about HD at length for which he has been on for about 4 years. We talked about medical goals, quality of life. We talked about symptoms, ros, including pain which he is followed by [name] NP for in pain contract, currently asymptomatic. We talked about appetite, nutrition education done. We talked about fluids. We talked about residing with his family, family dynamics. We talked about ESRD as chronic progression, discussed  expectations with h/o CHF, afib, DM. We talked about role pc in poc. Discussed pc f/u visit, Adam Howell in agreement, scheduled. Questions answered. Therapeutic listening, emotional support provided.  ? ?History obtained from review of EMR, discussion with primary team, and interview with family, facility staff/caregiver  and/or Mr. Foxworth.  ?I reviewed available labs, medications, imaging, studies and related documents from the EMR.  Records reviewed and summarized above.  ? ?ROS ?10 point system reviewed all negative except HPI ? ?Physical Exam: ?Constitutional: NAD ?General:obese, pleasant male ?EYES: anicteric sclera, lids intact, no discharge  ?ENMT: oral mucous membranes moist ?CV: S1S2, RRR ?Pulmonary: LCTA, no increased work of breathing, no cough, room air ?Abdomen: normo-active BS + 4 quadrants, soft and non tender, ?GU: deferred ?MSK: ambulatory ?Skin: warm and dry ?Neuro:  no generalized weakness,  no cognitive impairment ?Psych: non-anxious affect, A and O x 3 ?Thank you for the opportunity to participate in the care of Adam Howell.  The palliative care team will continue to follow. Please call our office at [phone] if we can be of additional assistance.  ? ?Nanna Ertle Z Chioma Mukherjee, NP  ? ?COVID-19 PATIENT SCREENING TOOL ?Asked and negative response unless otherwise noted:  ? ?Have you had symptoms of covid, tested positive or been in contact with someone with symptoms/positive test in the past 5-10 days? NO  ?

## 2021-08-12 ENCOUNTER — Telehealth: Payer: Medicare Other | Admitting: Nurse Practitioner

## 2021-08-12 ENCOUNTER — Encounter: Payer: Self-pay | Admitting: Nurse Practitioner

## 2021-08-12 DIAGNOSIS — R0602 Shortness of breath: Secondary | ICD-10-CM

## 2021-08-12 DIAGNOSIS — N186 End stage renal disease: Secondary | ICD-10-CM

## 2021-08-12 DIAGNOSIS — Z515 Encounter for palliative care: Secondary | ICD-10-CM

## 2021-08-12 NOTE — Progress Notes (Signed)
[facility] Consult Note Telephone: [phone]  Fax: [phone]    Date of encounter: 08/12/21 2:27 PM PATIENT NAME: Adam Howell [address] [place] [address]   [phone] (home)  DOB: Jun 27, 1960 MRN: [id number] PRIMARY CARE PROVIDER:    Venita Lick, NP,  [place] [address] [phone]  RESPONSIBLE PARTY:    Contact Information     Name Relation Home Work Mobile   speel,michelle Daughter [phone]  [phone]      Due to the COVID-19 crisis, this visit was done via telemedicine from my office and it was initiated and consent by this patient and or family.  I connected with  [name] OR PROXY on 08/12/21 by telephone as video not available enabled telemedicine application and verified that I am speaking with the correct person using two identifiers.   I discussed the limitations of evaluation and management by telemedicine. The patient expressed understanding and agreed to proceed.  Palliative Care was asked to follow this patient by consultation request of  Venita Lick, NP to address advance care planning and complex medical decision making. This is a follow up visit.                                  ASSESSMENT AND PLAN / RECOMMENDATIONS:  Symptom Management/Plan: 1. Advance Care Planning; Full code 2. Pain in both feet. Mr. Mcmanamon endorses "the prescription was not at the pharmacy the one you were suppose to send in for pain". Discussed with Mr. Kourtland Coopman is topical over the counter which would be out of pocket and aside from that recommendation would need to contact [facility] as he is [place]. Mr Parco verbalized understanding 3. Palliative care encounter; Palliative care encounter; Palliative medicine team will continue to support patient, patient's family, and medical team. Visit consisted of counseling and education dealing  with the complex and emotionally intense issues of symptom management and palliative care in the setting of serious and potentially life-threatening illness   4. Shortness of breath currently stable secondary to ESRD, continue compliance with treatments, weights; We talked about compliance with HD, nutrition, fluid, quality of life   5. f/u 2 month for ongoing monitoring chronic disease progression, ongoing discussions complex medical decision making  Follow up Palliative Care Visit: Palliative care will continue to follow for complex medical decision making, advance care planning, and clarification of goals. Return 8 weeks or prn.  I spent 31 minutes providing this consultation. More than 50% of the time in this consultation was spent in counseling and care coordination. PPS: 60% Chief Complaint: Follow up palliative consult for complex medical decision making HISTORY OF PRESENT ILLNESS:  Adam Howell is a 61 y.o. year old male  with multiple medical problems including ESRD on HD days Monday, Wed, Friday, HTN, afib, CHF, vasculitis ulcer lower extremity, DM, HLD, anemia, thrombophillia, chronic pain syndrome (under pain contract at [facility] with [name] NP), insomnia, morbid obesity. I called Mr. Koppelman for telemedicine telephonic as video not available f/u PC visit. We talked about purpose of PC visit. We talked about how Mr. Legan was feeling today. Mr Paulino endorses he is doing well. We talked about symptoms, ros, including pain which he is followed by [name] NP for in pain contract. Advised if he continues to have worsening  pain to contact [facility] to schedule f/u visit to re-address. We talked about appetite, nutrition education done, no increase in edema. No recent missed HD treatments, hospitalizations, falls, wounds, infections. We talked about ESRD as chronic progression, discussed expectations with h/o CHF, afib, DM. We talked about role pc in poc.  Discussed pc f/u visit, Mr. Syler in agreement, scheduled. Questions answered. Therapeutic listening, emotional support provided.   History obtained from review of EMR, discussion with Mr. Elena.  I reviewed available labs, medications, imaging, studies and related documents from the EMR.  Records reviewed and summarized above.   ROS 10 point system reviewed all negative except HPI  Physical Exam: deferred Thank you for the opportunity to participate in the care of Mr. Helvey.  The palliative care team will continue to follow. Please call our office at [phone] if we can be of additional assistance.   Rudi Bunyard Ihor Gully, NP

## 2021-08-15 ENCOUNTER — Other Ambulatory Visit: Payer: Self-pay

## 2021-08-15 ENCOUNTER — Ambulatory Visit: Payer: Self-pay

## 2021-08-15 NOTE — Telephone Encounter (Signed)
Requested medication (s) are due for refill today: yes  Requested medication (s) are on the active medication list: yes  Last refill:  04/10/21 #60/0  Future visit scheduled: yes scheduled for Monday 08/18/21  Notes to clinic:  Unable to refill per protocol, cannot delegate. Pt is out of medication and in severe pain with feet.      Requested Prescriptions  Pending Prescriptions Disp Refills   HYDROcodone-acetaminophen (NORCO) 10-325 MG tablet 60 tablet 0    Sig: Take 1 tablet by mouth every 6 (six) hours as needed.     Not Delegated - Analgesics:  Opioid Agonist Combinations Failed - 08/15/2021 12:40 PM      Failed - This refill cannot be delegated      Failed - Urine Drug Screen completed in last 360 days      Failed - Valid encounter within last 3 months    Recent Outpatient Visits           3 months ago Vasculitic ulcer of lower extremity ([place])   [place], Jolene T, NP   3 months ago Vasculitic ulcer of lower extremity ([place])   [place], Jolene T, NP   4 months ago Type 2 diabetes mellitus with ESRD (end-stage renal disease) ([place])   [place], Jolene T, NP   1 year ago Vasculitic ulcer of lower extremity ([place])   [place], Jolene T, NP   1 year ago Vasculitic ulcer of lower extremity ([place])   [place], Barbaraann Faster, NP

## 2021-08-15 NOTE — Telephone Encounter (Signed)
  Chief Complaint: foot pain Symptoms: both feet hurting, 10/10 Frequency: for a while Pertinent Negatives: Patient denies swelling Disposition: [] ED /[] Urgent Care (no appt availability in office) / [x] Appointment(In office/virtual)/ []  [facility]/ [] Home Care/ [] Refused Recommended Disposition /[]  Mobile Bus/ []  Follow-up with PCP Additional Notes: scheduled pt for appt on 08/18/21 at 1300. No appts available today so advised him I would send refill request in for hydrocodone since he is out.   Reason for Disposition  [1] SEVERE pain (e.g., excruciating, unable to do any normal activities) AND [2] not improved after 2 hours of pain medicine  Answer Assessment - Initial Assessment Questions 1. ONSET: "When did the pain start?"      For a while  2. LOCATION: "Where is the pain located?"      Both feet  3. PAIN: "How bad is the pain?"    (Scale 1-10; or mild, moderate, severe)  - MILD (1-3): doesn't interfere with normal activities.   - MODERATE (4-7): interferes with normal activities (e.g., work or school) or awakens from sleep, limping.   - SEVERE (8-10): excruciating pain, unable to do any normal activities, unable to walk.      10 6. OTHER SYMPTOMS: "Do you have any other symptoms?" (e.g., leg pain, rash, fever, numbness)     No  Protocols used: Foot Pain-A-AH

## 2021-08-18 ENCOUNTER — Ambulatory Visit: Payer: Medicare Other | Admitting: Physician Assistant

## 2021-08-21 ENCOUNTER — Encounter: Payer: Self-pay | Admitting: Physician Assistant

## 2021-08-21 ENCOUNTER — Telehealth (INDEPENDENT_AMBULATORY_CARE_PROVIDER_SITE_OTHER): Payer: Medicare Other | Admitting: Physician Assistant

## 2021-08-21 DIAGNOSIS — R52 Pain, unspecified: Secondary | ICD-10-CM | POA: Diagnosis not present

## 2021-08-21 NOTE — Progress Notes (Addendum)
Established Patient Office Visit  Name: Adam Howell   MRN: [id number]    DOB: 1960-11-04   Date:08/21/2021  Today's Provider: Talitha Givens, MHS, PA-C Introduced myself to the patient as a PA-C and provided education on APPs in clinical practice.   I connected with  Adam Howell on 08/21/21 by a video enabled telemedicine application and verified that I am speaking with the correct person using two identifiers.   I discussed the limitations of evaluation and management by telemedicine. The patient expressed understanding and agreed to proceed.  Patient location: Home Provider Location: home, [place]        Subjective  Chief Complaint  Chief Complaint  Patient presents with   Foot Pain    Pt states he has been having bilateral foot pain for a few months now. States that his feet ache all the time.     Foot Pain Pertinent negatives include no chills, diaphoresis, fever, rash or weakness.     States his feet have been hurting for several weeks Pain character: sharp pain, 10/10 Alleviating: resting and getting off feet  Aggravating: nothing Denies changes to color or appearance of feet, denies swelling, States the tops and bottoms of his feet are hurting, denies pain in ankles or shins   Patient Active Problem List   Diagnosis Date Noted   Controlled substance agreement signed 04/10/2021   Vasculitic ulcer of lower extremity ([place]) 02/02/2020   Other thrombophilia ([place]) 01/19/2020   Atrial fibrillation ([place]) 11/11/2019   Hyperlipidemia associated with type 2 diabetes mellitus ([place]) 11/11/2019   Erectile dysfunction 11/11/2019   Heart failure with reduced ejection fraction ([place]) 10/26/2019   Type 2 diabetes mellitus with ESRD (end-stage renal disease) ([place]) 10/04/2019   Chronic pain syndrome 10/04/2019   Insomnia 10/04/2019   Atherosclerotic heart disease of native coronary artery without angina pectoris 09/01/2019   Hypertensive heart and kidney disease  with heart failure and chronic kidney disease stage V ([place]) 09/01/2019   Morbid obesity ([place]) 09/01/2019   Anemia in chronic kidney disease 07/25/2019   End stage renal disease ([place]) 07/25/2019   Secondary hyperparathyroidism of renal origin ([place]) 07/25/2019    Past Surgical History:  Procedure Laterality Date   WOUND DEBRIDEMENT Left     Family History  Problem Relation Age of Onset   Heart failure Mother    Cirrhosis Father    Hypertension Daughter     Social History   Tobacco Use   Smoking status: Never   Smokeless tobacco: Never  Substance Use Topics   Alcohol use: Not Currently     Current Outpatient Medications:    albuterol (VENTOLIN HFA) 108 (90 Base) MCG/ACT inhaler, Inhale 2 puffs into the lungs every 6 (six) hours as needed for wheezing or shortness of breath., Disp: 18 g, Rfl: 3   amiodarone (PACERONE) 200 MG tablet, Take 1 tablet (200 mg total) by mouth 2 (two) times daily., Disp: 180 tablet, Rfl: 4   benzonatate (TESSALON PERLES) 100 MG capsule, Take 1 capsule (100 mg total) by mouth 3 (three) times daily as needed for cough., Disp: 60 capsule, Rfl: 0   Blood Glucose Monitoring Suppl (ONETOUCH VERIO) w/Device KIT, Use to check blood sugar 3 times a day and document results, bring to appointments.  Goal is <130 fasting blood sugar and <180 two hours after meals., Disp: 1 kit, Rfl: 0   Blood Pressure Monitoring (BLOOD PRESSURE MONITOR AUTOMAT) DEVI, Use to check blood  pressure twice daily., Disp: 1 each, Rfl: 1   bumetanide (BUMEX) 1 MG tablet, Take 1 tablet (1 mg total) by mouth 2 (two) times daily., Disp: 90 tablet, Rfl: 4   carvedilol (COREG) 3.125 MG tablet, Take 1 tablet (3.125 mg total) by mouth 2 (two) times daily., Disp: 180 tablet, Rfl: 4   collagenase (SANTYL) ointment, Apply 1 application topically 2 (two) times daily., Disp: 30 g, Rfl: 4   cyclobenzaprine (FLEXERIL) 10 MG tablet, Take 1 tablet (10 mg total) by mouth 3 (three) times daily as needed for  muscle spasms., Disp: 30 tablet, Rfl: 0   diclofenac Sodium (VOLTAREN) 1 % GEL, Apply 2 g topically 4 (four) times daily as needed (leg pain)., Disp: 50 g, Rfl: 1   digoxin (LANOXIN) 0.125 MG tablet, Take by mouth., Disp: , Rfl:    Dulaglutide (TRULICITY) 1.5 XT/0.5WP SOPN, Inject into the skin., Disp: , Rfl:    ELIQUIS 2.5 MG TABS tablet, Take 1 tablet (2.5 mg total) by mouth 2 (two) times daily. Sig: Daily as presciribed., Disp: 180 tablet, Rfl: 4   furosemide (LASIX) 80 MG tablet, Take 1 tablet (80 mg total) by mouth 2 (two) times daily., Disp: 180 tablet, Rfl: 4   gabapentin (NEURONTIN) 300 MG capsule, Take 1 capsule (300 mg total) by mouth 2 (two) times daily., Disp: 180 capsule, Rfl: 4   glucose blood (ONETOUCH VERIO) test strip, Use to check blood sugar 4 times a day., Disp: 100 each, Rfl: 12   guaiFENesin (MUCINEX) 600 MG 12 hr tablet, Take 1 tablet (600 mg total) by mouth 2 (two) times daily as needed., Disp: 60 tablet, Rfl: 1   HYDROcodone-acetaminophen (NORCO) 10-325 MG tablet, Take 1 tablet by mouth every 6 (six) hours as needed., Disp: 60 tablet, Rfl: 0   insulin aspart (NOVOLOG) 100 UNIT/ML FlexPen, Inject into the skin., Disp: , Rfl:    insulin glargine (LANTUS SOLOSTAR) 100 UNIT/ML Solostar Pen, Inject into the skin., Disp: , Rfl:    Insulin Pen Needle (NOVOFINE) 30G X 8 MM MISC, Use to inject insulin daily, Disp: 100 each, Rfl: 5   isosorbide mononitrate (IMDUR) 30 MG 24 hr tablet, Take 1 tablet (30 mg total) by mouth daily., Disp: 90 tablet, Rfl: 4   Lancets (ONETOUCH ULTRASOFT) lancets, Use to check blood sugar 4 times a day., Disp: 100 each, Rfl: 12   LEVEMIR FLEXTOUCH 100 UNIT/ML FlexTouch Pen, Inject 20 Units into the skin daily., Disp: 15 mL, Rfl: 4   lidocaine (REGENECARE HA) 2 % jelly, Apply 1 application topically daily as needed., Disp: 30 mL, Rfl: 4   Lidocaine 5 % CREA, Apply 1 application topically daily., Disp: 30 g, Rfl: 0   losartan (COZAAR) 100 MG tablet, Take 1  tablet (100 mg total) by mouth daily., Disp: 90 tablet, Rfl: 4   NIFEdipine (PROCARDIA-XL/NIFEDICAL-XL) 30 MG 24 hr tablet, Take 1 tablet (30 mg total) by mouth daily., Disp: 90 tablet, Rfl: 4   rosuvastatin (CRESTOR) 40 MG tablet, Take 1 tablet (40 mg total) by mouth daily., Disp: 90 tablet, Rfl: 4   sacubitril-valsartan (ENTRESTO) 49-51 MG, Take by mouth., Disp: , Rfl:    sertraline (ZOLOFT) 50 MG tablet, Take by mouth., Disp: , Rfl:    sevelamer carbonate (RENVELA) 800 MG tablet, Take 2 tablets (1,600 mg total) by mouth 3 (three) times daily., Disp: 360 tablet, Rfl: 4   traZODone (DESYREL) 50 MG tablet, Take 1 tablet (50 mg total) by mouth at bedtime as needed for  sleep., Disp: 90 tablet, Rfl: 4   triamcinolone cream (KENALOG) 0.1 %, Apply 1 application topically 2 (two) times daily., Disp: 30 g, Rfl: 0   Wound Cleansers (SKINTEGRITY WOUND) LIQD, Apply 1 application topically daily., Disp: 236 mL, Rfl: 3   zolpidem (AMBIEN) 10 MG tablet, Take 1 tablet (10 mg total) by mouth at bedtime as needed., Disp: 30 tablet, Rfl: 2  Allergies  Allergen Reactions   Tramadol Rash       Review of Systems  Constitutional:  Negative for chills, diaphoresis and fever.  Cardiovascular:  Negative for leg swelling.  Skin:  Negative for itching and rash.  Neurological:  Positive for tingling. Negative for weakness.      Objective  There were no vitals filed for this visit.  There is no height or weight on file to calculate BMI.  Physical Exam Neurological:     Mental Status: [name] and oriented to person, place, and time.  Psychiatric:        Speech: Speech normal.   Physical exam is limited due to nature of telephone visit. Above are the only physical exam findings gleaned from today's interaction.    No results found for this or any previous visit (from the past 2160 hour(s)).   PHQ2/9:    04/10/2021    9:07 AM 04/08/2020    3:56 PM 10/04/2019    1:18 PM  Depression screen PHQ  2/9  Decreased Interest 0 0 0  Down, Depressed, Hopeless 0 0 0  PHQ - 2 Score 0 0 0  Altered sleeping  0   Tired, decreased energy  0   Change in appetite  0   Feeling bad or failure about yourself   0   Trouble concentrating  0   Moving slowly or fidgety/restless  0   Suicidal thoughts  0   PHQ-9 Score  0       Fall Risk:    04/08/2020    3:56 PM 10/04/2019    1:18 PM  Fall Risk   Falls in the past year? 0 0  Number falls in past yr:  0  Injury with Fall?  0  Follow up  Falls evaluation completed      Functional Status Survey:      Assessment & Plan  Problem List Items Addressed This Visit   None Visit Diagnoses     Tingling pain    -  Primary Acute, new problem Patient reports bilateral foot pain and tingling of dorsum and bottom of feet Denies visual changes, swelling, or injury today Limitations of telephone/virtual visit did not allow for PE -recommend he come to office for in-person exam  Differential includes : vitamin b 12 deficiency, electrolyte abnormality, neuropathy - maybe associated with DM, PVD  Will order B12, CMP and A1c for rule out Follow up in office at earliest convenience.    Relevant Orders   B12   Comp Met (CMET)   HgB A1c        No follow-ups on file.   I, Jacoba Cherney E Marquite Attwood, PA-C, have reviewed all documentation for this visit. The documentation on 08/21/21 for the exam, diagnosis, procedures, and orders are all accurate and complete.   Talitha Givens, MHS, PA-C [facility]

## 2021-08-25 ENCOUNTER — Ambulatory Visit (INDEPENDENT_AMBULATORY_CARE_PROVIDER_SITE_OTHER): Payer: Medicare Other | Admitting: Physician Assistant

## 2021-08-25 VITALS — BP 105/73 | HR 87 | Temp 97.7°F | Ht 75.0 in | Wt 264.6 lb

## 2021-08-25 DIAGNOSIS — R52 Pain, unspecified: Secondary | ICD-10-CM | POA: Diagnosis not present

## 2021-08-25 NOTE — Assessment & Plan Note (Signed)
Acute, new problem States pain and tingling began a few weeks ago Reports it is bilateral in feet along dorsum and soles Reports some relief with rest and elevation- appears aggravated by walking or standing Considering presentation I am concerned for potential neuropathy or PVD  Will order A1c, B12, CMP, TSH to assist with rule out. Results to dictate further management Referral to Neurology to assist with potential neuropathy.  May need to refer to Vascular as well pending results of neurology visit Follow up as needed for persistent or progressing symptoms.

## 2021-08-25 NOTE — Progress Notes (Signed)
Established Patient Office Visit  Name: Adam Howell   MRN: [id number]    DOB: May 25, 1960   Date:08/25/2021  Today's Provider: Talitha Givens, MHS, PA-C Introduced myself to the patient as a PA-C and provided education on APPs in clinical practice.         Subjective  Chief Complaint  Chief Complaint  Patient presents with   Foot Pain    B/L foot pain, some days they are so bad he can't walk on them. Pain score 10/10 today and 10/10 on the days he can hardly walk on them.    HPI  Tingling in feet Per video visit last week Reports pain is 10/10 and sharp, has been ongoing for several weeks Denies changes to appearance, color or integrity of skin  Reports pain is in the dorsum and underside of feet  Today reports the same as above States it started a few weeks ago Denies previous instances of this  Aggravating: walking and standing on them Alleviating: resting and sitting, removing shoes and socks Denies recent injuries    Patient Active Problem List   Diagnosis Date Noted   Tingling pain 08/25/2021   Controlled substance agreement signed 04/10/2021   Vasculitic ulcer of lower extremity ([place]) 02/02/2020   Other thrombophilia ([place]) 01/19/2020   Atrial fibrillation ([place]) 11/11/2019   Hyperlipidemia associated with type 2 diabetes mellitus ([place]) 11/11/2019   Erectile dysfunction 11/11/2019   Heart failure with reduced ejection fraction ([place]) 10/26/2019   Type 2 diabetes mellitus with ESRD (end-stage renal disease) ([place]) 10/04/2019   Chronic pain syndrome 10/04/2019   Insomnia 10/04/2019   Atherosclerotic heart disease of native coronary artery without angina pectoris 09/01/2019   Hypertensive heart and kidney disease with heart failure and chronic kidney disease stage V ([place]) 09/01/2019   Morbid obesity ([place]) 09/01/2019   Anemia in chronic kidney disease 07/25/2019   End stage renal disease ([place]) 07/25/2019   Secondary hyperparathyroidism of renal origin  ([place]) 07/25/2019    Past Surgical History:  Procedure Laterality Date   WOUND DEBRIDEMENT Left     Family History  Problem Relation Age of Onset   Heart failure Mother    Cirrhosis Father    Hypertension Daughter     Social History   Tobacco Use   Smoking status: Never   Smokeless tobacco: Never  Substance Use Topics   Alcohol use: Not Currently     Current Outpatient Medications:    albuterol (VENTOLIN HFA) 108 (90 Base) MCG/ACT inhaler, Inhale 2 puffs into the lungs every 6 (six) hours as needed for wheezing or shortness of breath., Disp: 18 g, Rfl: 3   amiodarone (PACERONE) 200 MG tablet, Take 1 tablet (200 mg total) by mouth 2 (two) times daily., Disp: 180 tablet, Rfl: 4   Blood Glucose Monitoring Suppl (ONETOUCH VERIO) w/Device KIT, Use to check blood sugar 3 times a day and document results, bring to appointments.  Goal is <130 fasting blood sugar and <180 two hours after meals., Disp: 1 kit, Rfl: 0   Blood Pressure Monitoring (BLOOD PRESSURE MONITOR AUTOMAT) DEVI, Use to check blood pressure twice daily., Disp: 1 each, Rfl: 1   bumetanide (BUMEX) 1 MG tablet, Take 1 tablet (1 mg total) by mouth 2 (two) times daily., Disp: 90 tablet, Rfl: 4   carvedilol (COREG) 3.125 MG tablet, Take 1 tablet (3.125 mg total) by mouth 2 (two) times daily., Disp: 180 tablet, Rfl: 4   collagenase (SANTYL)  ointment, Apply 1 application topically 2 (two) times daily., Disp: 30 g, Rfl: 4   cyclobenzaprine (FLEXERIL) 10 MG tablet, Take 1 tablet (10 mg total) by mouth 3 (three) times daily as needed for muscle spasms., Disp: 30 tablet, Rfl: 0   diclofenac Sodium (VOLTAREN) 1 % GEL, Apply 2 g topically 4 (four) times daily as needed (leg pain)., Disp: 50 g, Rfl: 1   digoxin (LANOXIN) 0.125 MG tablet, Take by mouth., Disp: , Rfl:    Dulaglutide (TRULICITY) 1.5 MG/0.5ML SOPN, Inject into the skin., Disp: , Rfl:    ELIQUIS 2.5 MG TABS tablet, Take 1 tablet (2.5 mg total) by mouth 2 (two) times daily.  Sig: Daily as presciribed., Disp: 180 tablet, Rfl: 4   furosemide (LASIX) 80 MG tablet, Take 1 tablet (80 mg total) by mouth 2 (two) times daily., Disp: 180 tablet, Rfl: 4   gabapentin (NEURONTIN) 300 MG capsule, Take 1 capsule (300 mg total) by mouth 2 (two) times daily., Disp: 180 capsule, Rfl: 4   glucose blood (ONETOUCH VERIO) test strip, Use to check blood sugar 4 times a day., Disp: 100 each, Rfl: 12   guaiFENesin (MUCINEX) 600 MG 12 hr tablet, Take 1 tablet (600 mg total) by mouth 2 (two) times daily as needed., Disp: 60 tablet, Rfl: 1   HYDROcodone-acetaminophen (NORCO) 10-325 MG tablet, Take 1 tablet by mouth every 6 (six) hours as needed., Disp: 60 tablet, Rfl: 0   insulin aspart (NOVOLOG) 100 UNIT/ML FlexPen, Inject into the skin., Disp: , Rfl:    insulin glargine (LANTUS SOLOSTAR) 100 UNIT/ML Solostar Pen, Inject into the skin., Disp: , Rfl:    Insulin Pen Needle (NOVOFINE) 30G X 8 MM MISC, Use to inject insulin daily, Disp: 100 each, Rfl: 5   isosorbide mononitrate (IMDUR) 30 MG 24 hr tablet, Take 1 tablet (30 mg total) by mouth daily., Disp: 90 tablet, Rfl: 4   Lancets (ONETOUCH ULTRASOFT) lancets, Use to check blood sugar 4 times a day., Disp: 100 each, Rfl: 12   LEVEMIR FLEXTOUCH 100 UNIT/ML FlexTouch Pen, Inject 20 Units into the skin daily., Disp: 15 mL, Rfl: 4   lidocaine (REGENECARE HA) 2 % jelly, Apply 1 application topically daily as needed., Disp: 30 mL, Rfl: 4   Lidocaine 5 % CREA, Apply 1 application topically daily., Disp: 30 g, Rfl: 0   losartan (COZAAR) 100 MG tablet, Take 1 tablet (100 mg total) by mouth daily., Disp: 90 tablet, Rfl: 4   NIFEdipine (PROCARDIA-XL/NIFEDICAL-XL) 30 MG 24 hr tablet, Take 1 tablet (30 mg total) by mouth daily., Disp: 90 tablet, Rfl: 4   rosuvastatin (CRESTOR) 40 MG tablet, Take 1 tablet (40 mg total) by mouth daily., Disp: 90 tablet, Rfl: 4   sacubitril-valsartan (ENTRESTO) 49-51 MG, Take by mouth., Disp: , Rfl:    sertraline (ZOLOFT) 50 MG  tablet, Take by mouth., Disp: , Rfl:    sevelamer carbonate (RENVELA) 800 MG tablet, Take 2 tablets (1,600 mg total) by mouth 3 (three) times daily., Disp: 360 tablet, Rfl: 4   traZODone (DESYREL) 50 MG tablet, Take 1 tablet (50 mg total) by mouth at bedtime as needed for sleep., Disp: 90 tablet, Rfl: 4   triamcinolone cream (KENALOG) 0.1 %, Apply 1 application topically 2 (two) times daily., Disp: 30 g, Rfl: 0   zolpidem (AMBIEN) 10 MG tablet, Take 1 tablet (10 mg total) by mouth at bedtime as needed., Disp: 30 tablet, Rfl: 2  Allergies  Allergen Reactions   Tramadol Rash  I personally reviewed active problem list, medication list, allergies with the patient/caregiver today.   Review of Systems  Cardiovascular:  Negative for chest pain, claudication and leg swelling.  Musculoskeletal:        Bilateral foot pain   Neurological:  Positive for tingling.      Objective  Vitals:   08/25/21 1328  BP: 105/73  Pulse: 87  Temp: 97.7 F (36.5 C)  TempSrc: Oral  SpO2: 96%  Weight: 264 lb 9.6 oz (120 kg)  Height: $Remove'6\' 3"'AHWiaBm$  (1.905 m)    Body mass index is 33.07 kg/m.  Physical Exam Vitals reviewed.  Constitutional:      General: He is awake.     Appearance: Normal appearance. He is well-developed. He is obese.  HENT:     Head: Normocephalic and atraumatic.  Cardiovascular:     Pulses:          Dorsalis pedis pulses are 2+ on the right side and 1+ on the left side.       Posterior tibial pulses are 2+ on the right side and 1+ on the left side.     Comments: Some isolated scabs and lesions noted along the shins  Musculoskeletal:     Right lower leg: No edema.     Left lower leg: No edema.  Feet:     Right foot:     Skin integrity: Callus present. No ulcer.     Toenail Condition: Right toenails are abnormally thick. Fungal disease present.    Left foot:     Skin integrity: Callus present. No ulcer.     Toenail Condition: Left toenails are abnormally thick. Fungal disease  present. Skin:    General: Skin is warm.     Capillary Refill: Capillary refill takes 2 to 3 seconds.  Neurological:     Mental Status: [name].  Psychiatric:        Attention and Perception: Attention normal.        Mood and Affect: Mood and affect normal.        Speech: Speech normal.        Behavior: Behavior normal. Behavior is cooperative.      No results found for this or any previous visit (from the past 2160 hour(s)).   PHQ2/9:    04/10/2021    9:07 AM 04/08/2020    3:56 PM 10/04/2019    1:18 PM  Depression screen PHQ 2/9  Decreased Interest 0 0 0  Down, Depressed, Hopeless 0 0 0  PHQ - 2 Score 0 0 0  Altered sleeping  0   Tired, decreased energy  0   Change in appetite  0   Feeling bad or failure about yourself   0   Trouble concentrating  0   Moving slowly or fidgety/restless  0   Suicidal thoughts  0   PHQ-9 Score  0       Fall Risk:    04/08/2020    3:56 PM 10/04/2019    1:18 PM  Fall Risk   Falls in the past year? 0 0  Number falls in past yr:  0  Injury with Fall?  0  Follow up  Falls evaluation completed      Functional Status Survey:      Assessment & Plan  Problem List Items Addressed This Visit       Other   Tingling pain - Primary    Acute, new problem States pain and tingling began a  few weeks ago Reports it is bilateral in feet along dorsum and soles Reports some relief with rest and elevation- appears aggravated by walking or standing Considering presentation I am concerned for potential neuropathy or PVD  Will order A1c, B12, CMP, TSH to assist with rule out. Results to dictate further management Referral to Neurology to assist with potential neuropathy.  May need to refer to Vascular as well pending results of neurology visit Follow up as needed for persistent or progressing symptoms.        Relevant Orders   Ambulatory referral to Neurology   Comp Met (CMET)   HgB A1c   B12   TSH     No follow-ups on  file.   I, Serra Younan E Shannah Conteh, PA-C, have reviewed all documentation for this visit. The documentation on 08/25/21 for the exam, diagnosis, procedures, and orders are all accurate and complete.   Talitha Givens, MHS, PA-C [facility]

## 2021-08-26 LAB — COMPREHENSIVE METABOLIC PANEL
ALT: 16 IU/L (ref 0–44)
AST: 30 IU/L (ref 0–40)
Albumin/Globulin Ratio: 1 — ABNORMAL LOW (ref 1.2–2.2)
Albumin: 4.6 g/dL (ref 3.8–4.9)
Alkaline Phosphatase: 78 IU/L (ref 44–121)
BUN/Creatinine Ratio: 3 — ABNORMAL LOW (ref 10–24)
BUN: 22 mg/dL (ref 8–27)
Bilirubin Total: 0.5 mg/dL (ref 0.0–1.2)
CO2: 21 mmol/L (ref 20–29)
Calcium: 9.3 mg/dL (ref 8.6–10.2)
Chloride: 86 mmol/L — ABNORMAL LOW (ref 96–106)
Creatinine, Ser: 8.12 mg/dL — ABNORMAL HIGH (ref 0.76–1.27)
Globulin, Total: 4.7 g/dL — ABNORMAL HIGH (ref 1.5–4.5)
Glucose: 112 mg/dL — ABNORMAL HIGH (ref 70–99)
Potassium: 5.3 mmol/L — ABNORMAL HIGH (ref 3.5–5.2)
Sodium: 136 mmol/L (ref 134–144)
Total Protein: 9.3 g/dL — ABNORMAL HIGH (ref 6.0–8.5)
eGFR: 7 mL/min/{1.73_m2} — ABNORMAL LOW (ref >59)

## 2021-08-26 LAB — TSH: TSH: 1.79 u[IU]/mL (ref 0.450–4.500)

## 2021-08-26 LAB — HEMOGLOBIN A1C
Est. average glucose Bld gHb Est-mCnc: 137 mg/dL
Hgb A1c MFr Bld: 6.4 % — ABNORMAL HIGH (ref 4.8–5.6)

## 2021-08-26 LAB — VITAMIN B12: Vitamin B-12: 830 pg/mL (ref 232–1245)

## 2021-08-28 ENCOUNTER — Other Ambulatory Visit: Payer: Self-pay

## 2021-08-28 MED ORDER — INSULIN PEN NEEDLE 30G X 8 MM MISC: 5 refills | Status: DC

## 2021-08-28 NOTE — Telephone Encounter (Signed)
Called, spoke with pt in regards to his lab results. He verb understanding.   Pt also request medication refill for the following: Requested Prescriptions   Pending Prescriptions Disp Refills   insulin glargine (LANTUS SOLOSTAR) 100 UNIT/ML Solostar Pen 15 mL     Sig: Inject into the skin.   insulin aspart (NOVOLOG) 100 UNIT/ML FlexPen 15 mL     Sig: Inject into the skin.   HYDROcodone-acetaminophen (NORCO) 10-325 MG tablet 60 tablet 0    Sig: Take 1 tablet by mouth every 6 (six) hours as needed.   Insulin Pen Needle (NOVOFINE) 30G X 8 MM MISC 100 each 5    Sig: Use to inject insulin daily   Please sent to [place].

## 2021-08-28 NOTE — Telephone Encounter (Signed)
Requested Prescriptions  Pending Prescriptions Disp Refills  . insulin glargine (LANTUS SOLOSTAR) 100 UNIT/ML Solostar Pen 15 mL     Sig: Inject into the skin.     Endocrinology:  Diabetes - Insulins Passed - 08/28/2021  2:57 PM      Passed - HBA1C is between 0 and 7.9 and within 180 days    HB A1C (BAYER DCA - WAIVED)  Date Value Ref Range Status  04/10/2021 5.9 (H) 4.8 - 5.6 % Final    Comment:             Prediabetes: 5.7 - 6.4          Diabetes: >6.4          Glycemic control for adults with diabetes: <7.0    Hgb A1c MFr Bld  Date Value Ref Range Status  08/25/2021 6.4 (H) 4.8 - 5.6 % Final    Comment:             Prediabetes: 5.7 - 6.4          Diabetes: >6.4          Glycemic control for adults with diabetes: <7.0          Passed - Valid encounter within last 6 months    Recent Outpatient Visits          3 days ago Tingling pain   [facility] Mecum, Erin E, PA-C   1 week ago Tingling pain   [facility] Mecum, Erin E, PA-C   3 months ago Vasculitic ulcer of lower extremity ([place])   [place], Jolene T, NP   4 months ago Vasculitic ulcer of lower extremity ([place])   [place], Jolene T, NP   4 months ago Type 2 diabetes mellitus with ESRD (end-stage renal disease) ([place])   [place], Barbaraann Faster, NP      Future Appointments            In 2 months Cannady, Barbaraann Faster, NP [facility]           . insulin aspart (NOVOLOG) 100 UNIT/ML FlexPen 15 mL     Sig: Inject into the skin.     Endocrinology:  Diabetes - Insulins Passed - 08/28/2021  2:57 PM      Passed - HBA1C is between 0 and 7.9 and within 180 days    HB A1C (BAYER DCA - WAIVED)  Date Value Ref Range Status  04/10/2021 5.9 (H) 4.8 - 5.6 % Final    Comment:             Prediabetes: 5.7 - 6.4          Diabetes: >6.4          Glycemic control for adults with diabetes: <7.0    Hgb A1c MFr Bld  Date  Value Ref Range Status  08/25/2021 6.4 (H) 4.8 - 5.6 % Final    Comment:             Prediabetes: 5.7 - 6.4          Diabetes: >6.4          Glycemic control for adults with diabetes: <7.0          Passed - Valid encounter within last 6 months    Recent Outpatient Visits          3 days ago Tingling pain   [facility] Mecum, Junie Panning  E, PA-C   1 week ago Tingling pain   [facility] Mecum, Erin E, PA-C   3 months ago Vasculitic ulcer of lower extremity ([place])   [place], Jolene T, NP   4 months ago Vasculitic ulcer of lower extremity ([place])   [place], Jolene T, NP   4 months ago Type 2 diabetes mellitus with ESRD (end-stage renal disease) ([place])   [place], Barbaraann Faster, NP      Future Appointments            In 2 months Cannady, Barbaraann Faster, NP [facility]           . HYDROcodone-acetaminophen (NORCO) 10-325 MG tablet 60 tablet 0    Sig: Take 1 tablet by mouth every 6 (six) hours as needed.     Not Delegated - Analgesics:  Opioid Agonist Combinations Failed - 08/28/2021  2:57 PM      Failed - This refill cannot be delegated      Failed - Urine Drug Screen completed in last 360 days      Passed - Valid encounter within last 3 months    Recent Outpatient Visits          3 days ago Tingling pain   [place], Erin E, PA-C   1 week ago Tingling pain   [place], Erin E, PA-C   3 months ago Vasculitic ulcer of lower extremity ([place])   [place], Jolene T, NP   4 months ago Vasculitic ulcer of lower extremity ([place])   [place], Jolene T, NP   4 months ago Type 2 diabetes mellitus with ESRD (end-stage renal disease) ([place])   [place], Barbaraann Faster, NP      Future Appointments            In 2 months Cannady, Barbaraann Faster, NP [facility]            . Insulin Pen Needle (NOVOFINE) 30G X 8 MM MISC 100 each 5    Sig: Use to inject insulin daily     Endocrinology: Diabetes - Testing Supplies Passed - 08/28/2021  2:57 PM      Passed - Valid encounter within last 12 months    Recent Outpatient Visits          3 days ago Tingling pain   [facility] Mecum, Erin E, PA-C   1 week ago Tingling pain   [place], Erin E, PA-C   3 months ago Vasculitic ulcer of lower extremity ([place])   [place], Jolene T, NP   4 months ago Vasculitic ulcer of lower extremity ([place])   [place], Jolene T, NP   4 months ago Type 2 diabetes mellitus with ESRD (end-stage renal disease) ([place])   [place], Barbaraann Faster, NP      Future Appointments            In 2 months Cannady, Barbaraann Faster, NP [facility]

## 2021-08-28 NOTE — Telephone Encounter (Signed)
Requested medication (s) are due for refill today: unclear, no details on historical provider rx  Requested medication (s) are on the active medication list: yes  Last refill:  Norco 04/10/21 #60 with 0 RF  Future visit scheduled: 11/25/21  Notes to clinic:  Norco is not delegated and the other two are from historical providers, please assess.      Requested Prescriptions  Pending Prescriptions Disp Refills   insulin glargine (LANTUS SOLOSTAR) 100 UNIT/ML Solostar Pen 15 mL     Sig: Inject into the skin.     Endocrinology:  Diabetes - Insulins Passed - 08/28/2021  2:57 PM      Passed - HBA1C is between 0 and 7.9 and within 180 days    HB A1C (BAYER DCA - WAIVED)  Date Value Ref Range Status  04/10/2021 5.9 (H) 4.8 - 5.6 % Final    Comment:             Prediabetes: 5.7 - 6.4          Diabetes: >6.4          Glycemic control for adults with diabetes: <7.0    Hgb A1c MFr Bld  Date Value Ref Range Status  08/25/2021 6.4 (H) 4.8 - 5.6 % Final    Comment:             Prediabetes: 5.7 - 6.4          Diabetes: >6.4          Glycemic control for adults with diabetes: <7.0          Passed - Valid encounter within last 6 months    Recent Outpatient Visits           3 days ago Tingling pain   [facility] Mecum, Erin E, PA-C   1 week ago Tingling pain   [facility] Mecum, Erin E, PA-C   3 months ago Vasculitic ulcer of lower extremity ([place])   [place], Jolene T, NP   4 months ago Vasculitic ulcer of lower extremity ([place])   [place], Jolene T, NP   4 months ago Type 2 diabetes mellitus with ESRD (end-stage renal disease) ([place])   [place], Jolene T, NP       Future Appointments             In 2 months Cannady, Jolene T, NP [facility]             insulin aspart (NOVOLOG) 100 UNIT/ML FlexPen 15 mL     Sig: Inject into the skin.     Endocrinology:   Diabetes - Insulins Passed - 08/28/2021  2:57 PM      Passed - HBA1C is between 0 and 7.9 and within 180 days    HB A1C (BAYER DCA - WAIVED)  Date Value Ref Range Status  04/10/2021 5.9 (H) 4.8 - 5.6 % Final    Comment:             Prediabetes: 5.7 - 6.4          Diabetes: >6.4          Glycemic control for adults with diabetes: <7.0    Hgb A1c MFr Bld  Date Value Ref Range Status  08/25/2021 6.4 (H) 4.8 - 5.6 % Final    Comment:             Prediabetes: 5.7 - 6.4  Diabetes: >6.4          Glycemic control for adults with diabetes: <7.0          Passed - Valid encounter within last 6 months    Recent Outpatient Visits           3 days ago Tingling pain   [facility] Mecum, Erin E, PA-C   1 week ago Tingling pain   [place], Erin E, PA-C   3 months ago Vasculitic ulcer of lower extremity ([place])   [place], Jolene T, NP   4 months ago Vasculitic ulcer of lower extremity ([place])   [place], Jolene T, NP   4 months ago Type 2 diabetes mellitus with ESRD (end-stage renal disease) ([place])   [place], Jolene T, NP       Future Appointments             In 2 months Cannady, Barbaraann Faster, NP [facility]             HYDROcodone-acetaminophen (NORCO) 10-325 MG tablet 60 tablet 0    Sig: Take 1 tablet by mouth every 6 (six) hours as needed.     Not Delegated - Analgesics:  Opioid Agonist Combinations Failed - 08/28/2021  2:57 PM      Failed - This refill cannot be delegated      Failed - Urine Drug Screen completed in last 360 days      Passed - Valid encounter within last 3 months    Recent Outpatient Visits           3 days ago Tingling pain   [place], Erin E, PA-C   1 week ago Tingling pain   [place], Erin E, PA-C   3 months ago Vasculitic ulcer of lower extremity ([place])   [place], Jolene T, NP   4 months ago Vasculitic ulcer of lower extremity ([place])   [place], Jolene T, NP   4 months ago Type 2 diabetes mellitus with ESRD (end-stage renal disease) ([place])   [place], Barbaraann Faster, NP       Future Appointments             In 2 months Cannady, Barbaraann Faster, NP [facility]            Signed Prescriptions Disp Refills   Insulin Pen Needle (NOVOFINE) 30G X 8 MM MISC 100 each 5    Sig: Use to inject insulin daily     Endocrinology: Diabetes - Testing Supplies Passed - 08/28/2021  2:57 PM      Passed - Valid encounter within last 12 months    Recent Outpatient Visits           3 days ago Tingling pain   [place], Erin E, PA-C   1 week ago Tingling pain   [place], Erin E, PA-C   3 months ago Vasculitic ulcer of lower extremity ([place])   [place], Jolene T, NP   4 months ago Vasculitic ulcer of lower extremity ([place])   [place], Jolene T, NP   4 months ago Type 2 diabetes mellitus with ESRD (end-stage renal disease) ([place])   [place], Jolene T, NP       Future Appointments  In 2 months Cannady, Barbaraann Faster, NP [facility]

## 2021-08-29 MED ORDER — INSULIN ASPART 100 UNIT/ML FLEXPEN: 3.0000 [IU] | PEN_INJECTOR | Freq: Three times a day (TID) | SUBCUTANEOUS | 2 refills | Status: DC

## 2021-08-29 MED ORDER — LANTUS SOLOSTAR 100 UNIT/ML ~~LOC~~ SOPN
20.0000 [IU] | PEN_INJECTOR | Freq: Every day | SUBCUTANEOUS | 2 refills | Status: DC
Start: 2021-08-29 — End: 2022-01-07

## 2021-08-29 MED ORDER — HYDROCODONE-ACETAMINOPHEN 10-325 MG PO TABS
1.0000 | ORAL_TABLET | Freq: Four times a day (QID) | ORAL | 0 refills | Status: DC | PRN
Start: 2021-08-29 — End: 2021-10-21

## 2021-09-07 ENCOUNTER — Other Ambulatory Visit: Payer: Self-pay | Admitting: Nurse Practitioner

## 2021-09-08 ENCOUNTER — Telehealth: Payer: Self-pay

## 2021-09-08 NOTE — Telephone Encounter (Signed)
Pharmacy notified of Adam Howell's response.

## 2021-10-14 ENCOUNTER — Ambulatory Visit (INDEPENDENT_AMBULATORY_CARE_PROVIDER_SITE_OTHER): Payer: Medicare Other | Admitting: Unknown Physician Specialty

## 2021-10-14 ENCOUNTER — Ambulatory Visit: Payer: Medicare Other | Admitting: Nurse Practitioner

## 2021-10-14 ENCOUNTER — Telehealth: Payer: Self-pay

## 2021-10-14 ENCOUNTER — Encounter: Payer: Self-pay | Admitting: Unknown Physician Specialty

## 2021-10-14 VITALS — BP 114/71 | HR 91 | Temp 98.3°F | Wt 260.4 lb

## 2021-10-14 DIAGNOSIS — L239 Allergic contact dermatitis, unspecified cause: Secondary | ICD-10-CM

## 2021-10-14 MED ORDER — BETAMETHASONE DIPROPIONATE AUG 0.05 % EX CREA: TOPICAL_CREAM | Freq: Two times a day (BID) | CUTANEOUS | 0 refills | Status: DC

## 2021-10-14 MED ORDER — PREDNISONE 20 MG PO TABS: 40.0000 mg | ORAL_TABLET | Freq: Every day | ORAL | 0 refills | Status: DC

## 2021-10-14 NOTE — Telephone Encounter (Signed)
Contacted prescribing provider and clarified RX. 6 tablets were supposed to be sent in and not 3.   Verbal given to pharmacist to change prescription.

## 2021-10-14 NOTE — Progress Notes (Signed)
BP 114/71   Pulse 91   Temp 98.3 F (36.8 C) (Oral)   Wt 260 lb 6.4 oz (118.1 kg)   SpO2 93%   BMI 32.55 kg/m    Subjective:    [name], male    DOB: 1960-11-22, 61 y.o.   MRN: [id number]  HPI: Adam Howell is a 61 y.o. male  Chief Complaint  Patient presents with   Bumps    Pt states he has bumps on his arms and legs, states the ones on his leg itch. States he first noticed these bumps a few weeks ago. States he has not tried to use anything OTC for the bumps.    Pt with lesions on right lower leg, some on left lower leg, and some on right hand.  States they itch badly, mostly at night.  Developed but haven't gotten better or worse, and haven't spread.  No fever, not contact with plants, does have a dog, no new soaps.  He is not doing anything to treat them at this time.    Relevant past medical, surgical, family and social history reviewed and updated as indicated. Interim medical history since our last visit reviewed. Allergies and medications reviewed and updated.  Review of Systems  Per HPI unless specifically indicated above     Objective:    BP 114/71   Pulse 91   Temp 98.3 F (36.8 C) (Oral)   Wt 260 lb 6.4 oz (118.1 kg)   SpO2 93%   BMI 32.55 kg/m   Wt Readings from Last 3 Encounters:  10/14/21 260 lb 6.4 oz (118.1 kg)  08/25/21 264 lb 9.6 oz (120 kg)  05/09/21 267 lb (121.1 kg)    Physical Exam Constitutional:      General: He is not in acute distress.    Appearance: Normal appearance. He is well-developed.  HENT:     Head: Normocephalic and atraumatic.  Eyes:     General: Lids are normal. No scleral icterus.       Right eye: No discharge.        Left eye: No discharge.     Conjunctiva/sclera: Conjunctivae normal.  Cardiovascular:     Rate and Rhythm: Normal rate.  Pulmonary:     Effort: Pulmonary effort is normal.  Abdominal:     Palpations: There is no hepatomegaly or splenomegaly.  Musculoskeletal:         General: Normal range of motion.  Skin:    Coloration: Skin is not pale.     Findings: No rash.     Comments: Multiple pox type lesions on right lower leg, a few on left leg and right hand  Neurological:     Mental Status: [name] and oriented to person, place, and time.  Psychiatric:        Behavior: Behavior normal.        Thought Content: Thought content normal.        Judgment: Judgment normal.     Results for orders placed or performed in visit on 08/25/21  Comp Met (CMET)  Result Value Ref Range   Glucose 112 (H) 70 - 99 mg/dL   BUN 22 8 - 27 mg/dL   Creatinine, Ser 8.12 (H) 0.76 - 1.27 mg/dL   eGFR 7 (L) >59 mL/min/1.73   BUN/Creatinine Ratio 3 (L) 10 - 24   Sodium 136 134 - 144 mmol/L   Potassium 5.3 (H) 3.5 - 5.2 mmol/L   Chloride 86 (  L) 96 - 106 mmol/L   CO2 21 20 - 29 mmol/L   Calcium 9.3 8.6 - 10.2 mg/dL   Total Protein 9.3 (H) 6.0 - 8.5 g/dL   Albumin 4.6 3.8 - 4.9 g/dL   Globulin, Total 4.7 (H) 1.5 - 4.5 g/dL   Albumin/Globulin Ratio 1.0 (L) 1.2 - 2.2   Bilirubin Total 0.5 0.0 - 1.2 mg/dL   Alkaline Phosphatase 78 44 - 121 IU/L   AST 30 0 - 40 IU/L   ALT 16 0 - 44 IU/L  HgB A1c  Result Value Ref Range   Hgb A1c MFr Bld 6.4 (H) 4.8 - 5.6 %   Est. average glucose Bld gHb Est-mCnc 137 mg/dL  B12  Result Value Ref Range   Vitamin B-12 830 232 - 1,245 pg/mL  TSH  Result Value Ref Range   TSH 1.790 0.450 - 4.500 uIU/mL      Assessment & Plan:   Problem List Items Addressed This Visit   None Visit Diagnoses     Allergic contact dermatitis, unspecified trigger    -  Primary   Pt with non-specific pox type rash.  I suspect secondary contact from dog.  Will rx prednisone 40 mg daily for 3 days and diprolene        Follow up plan: Return if symptoms worsen or fail to improve.

## 2021-10-14 NOTE — Telephone Encounter (Signed)
Wells Guiles, [facility] at [facility] calling to get clarification on Prednisone 20 mg sent today to take 40 mg daily #3 pills. I called the office and spoke to [place], [facility] who asked me to conference the pharmacist over to be on hold while she find out, since Malachy Mood is not in the office. Wells Guiles transferred over.

## 2021-10-21 ENCOUNTER — Other Ambulatory Visit: Payer: Self-pay | Admitting: Nurse Practitioner

## 2021-10-21 NOTE — Telephone Encounter (Signed)
Medication Refill - Medication: zolpidem (AMBIEN) 10 MG tablet, HYDROcodone-acetaminophen (NORCO) 10-325 MG tablet  Has the patient contacted their pharmacy? Yes.     Preferred Pharmacy (with phone number or street name):  [facility] [place] - [address] Phone:  [phone]  Fax:  [phone]     Has the patient been seen for an appointment in the last year OR does the patient have an upcoming appointment? Yes.

## 2021-10-21 NOTE — Telephone Encounter (Signed)
Requested medications are due for refill today.  yes  Requested medications are on the active medications list.  yes  Last refill. Zolpidem 04/10/2021 #30 2 refills, Norco 08/29/2021 #60 0 refills  Future visit scheduled.   yes  Notes to clinic.  Refills not delegated.    Requested Prescriptions  Pending Prescriptions Disp Refills   zolpidem (AMBIEN) 10 MG tablet 30 tablet 2    Sig: Take 1 tablet (10 mg total) by mouth at bedtime as needed.     Not Delegated - Psychiatry:  Anxiolytics/Hypnotics Failed - 10/21/2021  1:08 PM      Failed - This refill cannot be delegated      Failed - Urine Drug Screen completed in last 360 days      Passed - Valid encounter within last 6 months    Recent Outpatient Visits           1 week ago Allergic contact dermatitis, unspecified trigger   [facility] Kathrine Haddock, NP   1 month ago Tingling pain   [facility] Mecum, Erin E, PA-C   2 months ago Tingling pain   [facility] Mecum, Erin E, PA-C   5 months ago Vasculitic ulcer of lower extremity ([place])   [place], Jolene T, NP   6 months ago Vasculitic ulcer of lower extremity ([place])   [place], Jolene T, NP       Future Appointments             In 1 month Cannady, [place] T, NP [employer], [facility]             HYDROcodone-acetaminophen (NORCO) 10-325 MG tablet 60 tablet 0    Sig: Take 1 tablet by mouth every 6 (six) hours as needed.     Not Delegated - Analgesics:  Opioid Agonist Combinations Failed - 10/21/2021  1:08 PM      Failed - This refill cannot be delegated      Failed - Urine Drug Screen completed in last 360 days      Passed - Valid encounter within last 3 months    Recent Outpatient Visits           1 week ago Allergic contact dermatitis, unspecified trigger   [place], NP   1 month ago Tingling pain   [facility] Mecum, Dani Gobble,  PA-C   2 months ago Tingling pain   [facility] Mecum, Erin E, PA-C   5 months ago Vasculitic ulcer of lower extremity ([place])   [place], Jolene T, NP   6 months ago Vasculitic ulcer of lower extremity ([place])   [place], Barbaraann Faster, NP       Future Appointments             In 1 month Cannady, Barbaraann Faster, NP [employer], [facility]

## 2021-10-22 MED ORDER — ZOLPIDEM TARTRATE 10 MG PO TABS: 10.0000 mg | ORAL_TABLET | Freq: Every evening | ORAL | 2 refills | Status: DC | PRN

## 2021-10-22 MED ORDER — HYDROCODONE-ACETAMINOPHEN 10-325 MG PO TABS
1.0000 | ORAL_TABLET | Freq: Four times a day (QID) | ORAL | 0 refills | Status: DC | PRN
Start: 2021-10-22 — End: 2021-12-25

## 2021-11-03 ENCOUNTER — Telehealth: Payer: Self-pay | Admitting: Nurse Practitioner

## 2021-11-03 ENCOUNTER — Telehealth: Payer: Self-pay

## 2021-11-03 NOTE — Telephone Encounter (Signed)
Annetta Maw (patient's daughter) called to follow up on some forms that were faxed to the office in regards to home healthcare for the patient.Please contact [name] when forms have been received. Best contact [phone]. She is on patient's DPR.

## 2021-11-03 NOTE — Telephone Encounter (Unsigned)
Copied from [place]. Topic: General - Other >> Nov 03, 2021 10:14 AM Cyndi Bender wrote: Reason for CRM: Annette with [facility] stated she will be faxing over a Medicaid form and she would like to ask that it is completed and faxed back asap.

## 2021-11-03 NOTE — Telephone Encounter (Signed)
Noted  

## 2021-11-04 ENCOUNTER — Other Ambulatory Visit: Payer: Medicare Other | Admitting: Nurse Practitioner

## 2021-11-05 ENCOUNTER — Telehealth: Payer: Self-pay

## 2021-11-05 NOTE — Telephone Encounter (Unsigned)
Copied from [place] [phone]. Topic: General - Other >> Nov 04, 2021  2:49 PM Everette C wrote: Reason for CRM: Anne Ng with [employer] has called to share that they're submitting a new 3051 for the patient via fax   Anne Ng would like to be contacted to confirm that the newly submitted paperwork is viewable and able to be completed  Please contact Anne Ng to confirm receipt

## 2021-11-06 NOTE — Telephone Encounter (Signed)
Annette with [facility] has called to provide the following information  Current Residence:  [place], [place] [address]   Alternate Contact   Gary Love [phone]  Please contact further if needed

## 2021-11-06 NOTE — Telephone Encounter (Signed)
Spoke with Anne Ng with [facility] to inform her that the requested [id number] need patient's current place of residence and needed an alternative contact information in order for the form to be completed. Anne Ng says she would reach out to patient's daughter to gather the requested information and let our office know.   OK for [facility] to gather the requested information if Anne Ng calls back.

## 2021-11-06 NOTE — Telephone Encounter (Signed)
Added requested information that was provided by Anne Ng from [employer] was added to the patient's paperwork. Paperwork was faxed back to [employer] at [phone].

## 2021-11-12 ENCOUNTER — Ambulatory Visit: Payer: Medicare Other | Admitting: Nurse Practitioner

## 2021-11-12 DIAGNOSIS — R0602 Shortness of breath: Secondary | ICD-10-CM

## 2021-11-12 DIAGNOSIS — Z515 Encounter for palliative care: Secondary | ICD-10-CM

## 2021-11-12 DIAGNOSIS — N186 End stage renal disease: Secondary | ICD-10-CM

## 2021-11-13 ENCOUNTER — Encounter: Payer: Self-pay | Admitting: Nurse Practitioner

## 2021-11-13 NOTE — Progress Notes (Signed)
[facility] Consult Note Telephone: [phone]  Fax: [phone]    Date of encounter: 11/13/21 9:15 AM PATIENT NAME: Adam Howell [place] [address]   [phone] (home)  DOB: [birth year] MRN: [id number] PRIMARY CARE PROVIDER:    Venita Lick, NP,  [place] [address] [phone]  RESPONSIBLE PARTY:    Contact Information     Name Relation Home Work Mobile   speel,michelle Daughter [phone]  [phone]        Due to the COVID-19 crisis, this visit was done via telemedicine from my office and it was initiated and consent by this patient and or family.   I connected with  Adam Howell OR PROXY on 08/12/21 by telephone as video not available enabled telemedicine application and verified that I am speaking with the correct person using two identifiers.   I discussed the limitations of evaluation and management by telemedicine. The patient expressed understanding and agreed to proceed.  Palliative Care was asked to follow this patient by consultation request of  Adam Lick, NP to address advance care planning and complex medical decision making. This is a follow up visit.                                  ASSESSMENT AND PLAN / RECOMMENDATIONS:  Symptom Management/Plan: 1. Advance Care Planning; Full code 2. Pain in both feet. Improved, currently denies pain; current regimen reviewed; continue with Adam Howell as he is UNDER PAIN CONTRACT WITH [facility]. Adam Howell verbalized understanding 3. Shortness of breath currently stable secondary to ESRD, continue compliance with treatments, weights; We talked about compliance with HD, nutrition, fluid, quality of life 4. Palliative care encounter; Palliative care encounter; Palliative medicine team will continue to support patient, patient's family, and medical team. Visit consisted of counseling and education dealing  with the complex and emotionally intense issues of symptom management and palliative care in the setting of serious and potentially life-threatening illness. F/u 2 month for ongoing monitoring chronic disease progression, ongoing discussions complex medical decision making   Follow up Palliative Care Visit: Palliative care will continue to follow for complex medical decision making, advance care planning, and clarification of goals. Return 8 weeks or prn.   I spent 32 minutes providing this consultation. More than 50% of the time in this consultation was spent in counseling and care coordination. PPS: 60% Chief Complaint: Follow up palliative consult for complex medical decision making HISTORY OF PRESENT ILLNESS:  Adam Howell is a 61 y.o. year old male  with multiple medical problems including ESRD on HD days Monday, Wed, Friday, HTN, afib, CHF, vasculitis ulcer lower extremity, DM, HLD, anemia, thrombophillia, chronic pain syndrome (under pain contract at [facility] with Adam Guarneri NP), insomnia, morbid obesity. I called Adam. Howell for telemedicine telephonic as video not available f/u PC visit. We talked about purpose of PC visit. We talked about how Adam Howell was feeling today. Adam Howell endorses he is doing well. We talked about Adam Howell pain specifically in both feet which has improved with current regimen. We talked about medications; ros, shortness of breath. We talked about HD which he currently continues to go, no missed treatments; Adam. Howell endorses overall he is doing well. We talked about no recent falls, hospitalizations, infections. He is independent with adl's, feeds himself with good  appetite, last weight 10/14/2021 was 260 lbs when he was seen at [facility] for allergic dermatitis received predisone. We talked about new renal mass which was discovered which he will need to go to further diagnostic testing, questions answered. We talked about ESRD as chronic  progression, discussed expectations with h/o CHF, afib, DM. We talked about role pc in poc. Discussed pc f/u visit, Adam Howell in agreement, scheduled. Questions answered. Therapeutic listening, emotional support provided.    History obtained from review of EMR, discussion with Adam Howell.  I reviewed available labs, medications, imaging, studies and related documents from the EMR.  Records reviewed and summarized above.    ROS 10 point system reviewed all negative except HPI   Physical Exam: deferred Thank you for the opportunity to participate in the care of Adam Howell.  The palliative care team will continue to follow. Please call our office at [phone] if we can be of additional assistance.   Adam [facility] Ihor Gully, NP

## 2021-11-23 NOTE — Patient Instructions (Signed)

## 2021-11-25 ENCOUNTER — Encounter: Payer: Self-pay | Admitting: Nurse Practitioner

## 2021-11-25 ENCOUNTER — Ambulatory Visit (INDEPENDENT_AMBULATORY_CARE_PROVIDER_SITE_OTHER): Payer: Medicare Other

## 2021-11-25 ENCOUNTER — Ambulatory Visit (INDEPENDENT_AMBULATORY_CARE_PROVIDER_SITE_OTHER): Payer: Medicare Other | Admitting: Nurse Practitioner

## 2021-11-25 VITALS — BP 128/79 | HR 80 | Temp 98.0°F | Ht 75.0 in | Wt 262.1 lb

## 2021-11-25 DIAGNOSIS — I5020 Unspecified systolic (congestive) heart failure: Secondary | ICD-10-CM

## 2021-11-25 DIAGNOSIS — E6609 Other obesity due to excess calories: Secondary | ICD-10-CM

## 2021-11-25 DIAGNOSIS — I132 Hypertensive heart and chronic kidney disease with heart failure and with stage 5 chronic kidney disease, or end stage renal disease: Secondary | ICD-10-CM | POA: Diagnosis not present

## 2021-11-25 DIAGNOSIS — D631 Anemia in chronic kidney disease: Secondary | ICD-10-CM

## 2021-11-25 DIAGNOSIS — Z0000 Encounter for general adult medical examination without abnormal findings: Secondary | ICD-10-CM

## 2021-11-25 DIAGNOSIS — F5101 Primary insomnia: Secondary | ICD-10-CM

## 2021-11-25 DIAGNOSIS — E1169 Type 2 diabetes mellitus with other specified complication: Secondary | ICD-10-CM

## 2021-11-25 DIAGNOSIS — N185 Chronic kidney disease, stage 5: Secondary | ICD-10-CM

## 2021-11-25 DIAGNOSIS — E1122 Type 2 diabetes mellitus with diabetic chronic kidney disease: Secondary | ICD-10-CM | POA: Diagnosis not present

## 2021-11-25 DIAGNOSIS — G894 Chronic pain syndrome: Secondary | ICD-10-CM

## 2021-11-25 DIAGNOSIS — N186 End stage renal disease: Secondary | ICD-10-CM

## 2021-11-25 DIAGNOSIS — Z6832 Body mass index (BMI) 32.0-32.9, adult: Secondary | ICD-10-CM

## 2021-11-25 DIAGNOSIS — D6869 Other thrombophilia: Secondary | ICD-10-CM

## 2021-11-25 DIAGNOSIS — N2581 Secondary hyperparathyroidism of renal origin: Secondary | ICD-10-CM

## 2021-11-25 DIAGNOSIS — L97911 Non-pressure chronic ulcer of unspecified part of right lower leg limited to breakdown of skin: Secondary | ICD-10-CM

## 2021-11-25 DIAGNOSIS — I2510 Atherosclerotic heart disease of native coronary artery without angina pectoris: Secondary | ICD-10-CM

## 2021-11-25 DIAGNOSIS — Z992 Dependence on renal dialysis: Secondary | ICD-10-CM

## 2021-11-25 DIAGNOSIS — I4891 Unspecified atrial fibrillation: Secondary | ICD-10-CM

## 2021-11-25 DIAGNOSIS — E785 Hyperlipidemia, unspecified: Secondary | ICD-10-CM

## 2021-11-25 LAB — BAYER DCA HB A1C WAIVED: HB A1C (BAYER DCA - WAIVED): 5.6 % (ref 4.8–5.6)

## 2021-11-25 MED ORDER — SANTYL 250 UNIT/GM EX OINT: 1.0000 | TOPICAL_OINTMENT | Freq: Every day | CUTANEOUS | 0 refills | Status: DC

## 2021-11-25 NOTE — Assessment & Plan Note (Signed)
Chronic, ongoing, followed by dialysis team.  BP at goal in office today.  Continue current medication regimen as prescribed by them.  Attempt to obtain recent notes from [facility].  Labs today: lipid panel.  Recommend he monitor BP at home a few days a week and document + focus on DASH diet.  Recent nephrology notes reviewed.  Return to office in 3 months.

## 2021-11-25 NOTE — Assessment & Plan Note (Signed)
With atrial fibrillation and on Eliquis.  Continue to collaborate with cardiology and continue current medication regimen. 

## 2021-11-25 NOTE — Assessment & Plan Note (Signed)
Chronic, ongoing issue with no recent CP or NTG use.  Have highly recommend he schedule follow-up with cardiology, has not returned to visit since August 2021.

## 2021-11-25 NOTE — Assessment & Plan Note (Addendum)
Chronic, ongoing, followed by nephrology and dialysis teams.  Unsure about diet compliance as tends to eat fast food on occasion.  Continue current medication regimen as prescribed, refills sent.  Attempt to obtain recent notes from [facility]. Continue palliative visits.  Return to office in 3 months for follow-up.

## 2021-11-25 NOTE — Progress Notes (Signed)
BP 128/79   Pulse 80   Temp 98 F (36.7 C) (Oral)   Ht 6\' 3"  (1.905 m)   Wt 262 lb 1.6 oz (118.9 kg)   SpO2 97%   BMI 32.76 kg/m    Subjective:    [name], male    DOB: [birth year], 61 y.o.   MRN: [id number]  HPI: Adam Howell is a 61 y.o. male  Chief Complaint  Patient presents with   Chronic Kidney Disease   Hyperlipidemia   Hypertension   Diabetes   Atrial Fibrillation   Leg Pain    Patient says he has a sore on his R shin that is causing some pain and discomfort. Patient says the sore has been there for about two months. Patient would like to discuss with provider at today's visit.    DIABETES Continues Levemir 20 units daily.  Has labs at dialysis and recent A1c with PCP was 6.4% in June.  Range since August 2020 -- 5.6 to 6.8%.  Continues on Trulicity, Lantus, and Novolog. Palliative NP does visits, last 11/12/21.  Hypoglycemic episodes:yes Polydipsia/polyuria: no Visual disturbance: no Chest pain: no Paresthesias: no Glucose Monitoring: yes             Accucheck frequency: daily             Fasting glucose: <130             Post prandial:             Evening:             Before meals: Taking Insulin?: yes             Long acting insulin: Lantus 20 units             Short acting insulin: Novolog 3 units before meals Blood Pressure Monitoring: not checking Retinal Examination: Not up to Date Foot Exam: Up To Date Pneumovax: Up to Date was given at dialysis Influenza: Up to Date Aspirin: no   HYPERTENSION / HYPERLIPIDEMIA/HF Taking Losartan 100 MG daily, Eliquis, Amiodarone, Carvedilol 3.125 MG BID, Procardia 30 MG daily, Lasix 80 MG BID, Renvela, Imdur, and Bumex.  ? Entresto, he is not sure.  Takes Crestor daily for HLD.  Takes Gabapentin 300 MG BID.   He saw Dr. Saunders Revel with cardiology on 11/10/19 -- has not returned since this time, missed follow-up.  Recent EF on 01/10/20 was 35-40%.   Satisfied with current treatment?  yes Duration of hypertension: chronic BP monitoring frequency: not checking BP range:  BP medication side effects: no Duration of hyperlipidemia: chronic Cholesterol medication side effects: no Cholesterol supplements: none Medication compliance: good compliance Aspirin: no Recent stressors: no Recurrent headaches: no Visual changes: no Palpitations: no Dyspnea: no Chest pain: no Lower extremity edema: at baseline Dizzy/lightheaded: no   CHRONIC KIDNEY DISEASE End stage renal disease and obtains dialysis.  Goes to dialysis Monday, Wednesday, Friday.  Saw nephrology last 10/30/21 and currently they are monitoring as concern for CA on kidney.    He reports taking Norco, has been on long term, for post dialysis discomfort.   Reviewed PDMP last fill Ambien 11/19/21 for 30 tablets and Norco 10-325 11/19/21. CKD status: stable Medications renally dose: yes Previous renal evaluation: yes Pneumovax:  Up to Date Influenza Vaccine:  Up to Date    INSOMNIA Taking Trazodone as needed for sleep + Ambien -- alternates these. Duration: chronic Satisfied with sleep quality: yes Difficulty falling asleep: yes  Difficulty staying asleep: no Waking a few hours after sleep onset: no Early morning awakenings: no Daytime hypersomnolence: no Wakes feeling refreshed: yes Good sleep hygiene: yes Apnea: no Snoring: no Depressed/anxious mood: no Recent stress: no Restless legs/nocturnal leg cramps: no Chronic pain/arthritis: no History of sleep study: no Treatments attempted: ambien   SKIN WOUND Reports there is a wound present to right shin at this time, that has been there for 2 months -- is having discomfort to area.  Has history of wounds ongoing, last to left lower leg in February 2023.  Ongoing issue with wounds -- was to see [facility] in November 2021 for similar issue -- he then went back to [place] for a year they did operation on it and cleaned it out + had home health coming in for wound care.   Duration: 2 months Location:  right shin Painful:  irritation Itching: none Onset: sudden Context: not changing Associated signs and symptoms: none History of skin cancer: no History of precancerous skin lesions: no Family history of skin cancer: no  Relevant past medical, surgical, family and social history reviewed and updated as indicated. Interim medical history since our last visit reviewed. Allergies and medications reviewed and updated.  Review of Systems  Constitutional:  Negative for activity change, diaphoresis, fatigue and fever.  Respiratory:  Negative for cough, chest tightness, shortness of breath and wheezing.   Cardiovascular:  Negative for chest pain, palpitations and leg swelling.  Gastrointestinal: Negative.   Endocrine: Negative for polydipsia, polyphagia and polyuria.  Skin:  Positive for wound.  Neurological: Negative.   Psychiatric/Behavioral: Negative.      Per HPI unless specifically indicated above     Objective:    BP 128/79   Pulse 80   Temp 98 F (36.7 C) (Oral)   Ht 6\' 3"  (1.905 m)   Wt 262 lb 1.6 oz (118.9 kg)   SpO2 97%   BMI 32.76 kg/m   Wt Readings from Last 3 Encounters:  11/25/21 262 lb 1.6 oz (118.9 kg)  11/25/21 262 lb 1.6 oz (118.9 kg)  10/14/21 260 lb 6.4 oz (118.1 kg)    Physical Exam Vitals and nursing note reviewed.  Constitutional:      General: He is awake. He is not in acute distress.    Appearance: He is well-developed and well-groomed. He is morbidly obese. He is not ill-appearing or toxic-appearing.  HENT:     Head: Normocephalic and atraumatic.     Right Ear: Hearing normal. No drainage.     Left Ear: Hearing normal. No drainage.  Eyes:     General: Lids are normal.        Right eye: No discharge.        Left eye: No discharge.     Conjunctiva/sclera: Conjunctivae normal.     Pupils: Pupils are equal, round, and reactive to light.  Neck:     Thyroid: No thyromegaly.     Vascular: No carotid bruit.      Trachea: Trachea normal.  Cardiovascular:     Rate and Rhythm: Normal rate and regular rhythm.     Heart sounds: Normal heart sounds, S1 normal and S2 normal. No murmur heard.    No gallop.     Arteriovenous access: Left arteriovenous access is present. Pulmonary:     Effort: Pulmonary effort is normal. No accessory muscle usage or respiratory distress.     Breath sounds: Normal breath sounds.  Abdominal:     General: Bowel sounds are  normal. There is no distension.     Palpations: Abdomen is soft.     Tenderness: There is no abdominal tenderness.  Musculoskeletal:        General: Normal range of motion.     Cervical back: Normal range of motion and neck supple.     Right lower leg: No edema.     Left lower leg: No edema.  Skin:    General: Skin is warm and dry.     Capillary Refill: Capillary refill takes less than 2 seconds.     Findings: No rash.          Comments: Bilateral lower extremities with xerosis.  Dressing in place to left lower leg -- patient reports wound to leg is healing and smaller approx 5 cm.  Neurological:     Mental Status: [name] and oriented to person, place, and time.     Deep Tendon Reflexes: Reflexes are normal and symmetric.  Psychiatric:        Attention and Perception: Attention normal.        Mood and Affect: Mood normal.        Speech: Speech normal.        Behavior: Behavior normal. Behavior is cooperative.        Thought Content: Thought content normal.        Judgment: Judgment normal.    Diabetic Foot Exam - Simple   No data filed      Results for orders placed or performed in visit on 11/25/21  Bayer DCA Hb A1c Waived  Result Value Ref Range   HB A1C (BAYER DCA - WAIVED) 5.6 4.8 - 5.6 %      Assessment & Plan:   Problem List Items Addressed This Visit       Cardiovascular and Mediastinum   Atherosclerotic heart disease of native coronary artery without angina pectoris    Chronic, ongoing issue with no recent CP or NTG  use.  Have highly recommend he schedule follow-up with cardiology, has not returned to visit since August 2021.      Atrial fibrillation (HCC)    Chronic, ongoing.  Continue current medication regimen and collaboration with local cardiology office.  Return to office in 3 months.  Have highly recommend he schedule follow-up with cardiology, has not returned since August 2021.      Relevant Orders   Lipid Panel w/o Chol/HDL Ratio   Heart failure with reduced ejection fraction (HCC)    Chronic, stable, euvolemic today.  Continue current medication regimen and adjust as needed.  Continue collaboration with cardiology. Have highly recommend he schedule follow-up with cardiology, last visit August 2021. - Reminded to call for an overnight weight gain of >2 pounds or a weekly weight weight of >5 pounds - not adding salt to his food and has been reading food labels. Reviewed the importance of keeping daily sodium intake to 2000mg  daily  - Avoid NSAIDS      Relevant Orders   Lipid Panel w/o Chol/HDL Ratio   Hypertensive heart and kidney disease with heart failure and chronic kidney disease stage V (HCC)    Chronic, ongoing, followed by dialysis team.  BP at goal in office today.  Continue current medication regimen as prescribed by them.  Attempt to obtain recent notes from [facility].  Labs today: lipid panel.  Recommend he monitor BP at home a few days a week and document + focus on DASH diet.  Recent nephrology notes reviewed.  Return to office in 3 months.       Relevant Orders   Bayer DCA Hb A1c Waived (Completed)     Endocrine   Hyperlipidemia associated with type 2 diabetes mellitus (HCC)    Chronic, ongoing.  Continue current medication regimen and adjust as needed.  Lipid panel today.         Relevant Orders   Bayer DCA Hb A1c Waived (Completed)   Lipid Panel w/o Chol/HDL Ratio   Secondary hyperparathyroidism of renal origin ([place])    Chronic, ongoing with ESRD.  Continue  collaboration with dialysis team and nephrology, attempt to obtain recent notes from [place].      Type 2 diabetes mellitus with ESRD (end-stage renal disease) ([place]) - Primary    Chronic, ongoing with A1c 5.6% today.  At goal.  Continue current medication regimen and adjust as needed.  Recommend he monitor BS at least a couple times a day and document for provider + focus on diabetic diet.  CCM referral is in place.  Return to office in 3 months for follow-up.  He was unable to obtain urine sample today for ALB check.      Relevant Orders   Bayer DCA Hb A1c Waived (Completed)   Ambulatory referral to Vascular Surgery     Musculoskeletal and Integument   Skin ulcer of right lower leg, limited to breakdown of skin ([place])    New wound to right shin, concern for eschar to outer aspect -- ?wound depth present under area.  Will place referral urgent to wound care, discussed at length with patient -- important to schedule.  Santyl ordered, which worked well on past wounds.  Referral to vascular, placed in past but did not attend, new one placed today and discussed at length importance of attending to check blood flow in legs.      Relevant Orders   Ambulatory referral to [facility]   Ambulatory referral to Vascular Surgery     Genitourinary   Anemia in chronic kidney disease    Chronic, ongoing, followed by dialysis teams.  Continue current medication regimen as prescribed by them.  Attempt to obtain recent notes from [facility].  Return in 3 months.      End stage renal disease (HCC)    Chronic, ongoing, followed by nephrology and dialysis teams.  Unsure about diet compliance as tends to eat fast food on occasion.  Continue current medication regimen as prescribed, refills sent.  Attempt to obtain recent notes from [facility]. Continue palliative visits.  Return to office in 3 months for follow-up.        Relevant Orders   Ambulatory referral to Vascular Surgery     Hematopoietic and  Hemostatic   Other thrombophilia ([place])    With atrial fibrillation and on Eliquis.  Continue to collaborate with cardiology and continue current medication regimen.        Other   Chronic pain syndrome    Ongoing, reports taking Norco only after dialysis due to discomfort.  Controlled substance contract up to date, unable to void due to dialysis, will consider pain management referral if frequent use of Norco.  Refill as needed and will have more frequent visits if needs increased refills.      Insomnia    Chronic, ongoing.  Will continue Ambien and Trazodone, reviewed PDMP and has been taking this.  Controlled substance contract up to date.  Adjust regimen as needed.  Return in 3 months.      Obesity  BMI 32.76 with T2DM, ESRD.  Recommended eating smaller high protein, low fat meals more frequently and exercising 30 mins a day 5 times a week with a goal of 10-15lb weight loss in the next 3 months. Patient voiced their understanding and motivation to adhere to these recommendations.         Follow up plan: Return in about 3 weeks (around 12/16/2021) for Ulcer right shin .

## 2021-11-25 NOTE — Assessment & Plan Note (Signed)
Chronic, ongoing.  Continue current medication regimen and collaboration with local cardiology office.  Return to office in 3 months.  Have highly recommend he schedule follow-up with cardiology, has not returned since August 2021.

## 2021-11-25 NOTE — Assessment & Plan Note (Signed)
Chronic, ongoing, followed by dialysis teams.  Continue current medication regimen as prescribed by them.  Attempt to obtain recent notes from [facility].  Return in 3 months. 

## 2021-11-25 NOTE — Progress Notes (Signed)
Yes  Subjective:   Adam Howell is a 61 y.o. male who presents for Medicare Annual/Subsequent preventive examination.  Review of Systems    Defer to PCP.        Objective:    Today's Vitals   11/25/21 1407 11/25/21 1409 11/25/21 1422  BP: 128/79    Pulse: 80    Temp: 98 F (36.7 C)    TempSrc: Oral    SpO2: 97%    Weight: 262 lb 1.6 oz (118.9 kg)    Height: _0  (1.905 m)    PainSc: 10-Worst pain ever 10-Worst pain ever 10-Worst pain ever   Body mass index is 32.76 kg/m.     04/13/2021   12:47 PM  Advanced Directives  Does Patient Have a Medical Advance Directive? Unable to assess, patient is non-responsive or altered mental status    Current Medications (verified) Outpatient Encounter Medications as of 11/25/2021  Medication Sig   amiodarone (PACERONE) 200 MG tablet Take 1 tablet (200 mg total) by mouth 2 (two) times daily.   augmented betamethasone dipropionate (DIPROLENE AF) 0.05 % cream Apply topically 2 (two) times daily.   Blood Glucose Monitoring Suppl (ONETOUCH VERIO) w/Device KIT Use to check blood sugar 3 times a day and document results, bring to appointments.  Goal is <130 fasting blood sugar and <180 two hours after meals.   Blood Pressure Monitoring (BLOOD PRESSURE MONITOR AUTOMAT) DEVI Use to check blood pressure twice daily.   bumetanide (BUMEX) 1 MG tablet Take 1 tablet (1 mg total) by mouth 2 (two) times daily.   carvedilol (COREG) 3.125 MG tablet Take 1 tablet (3.125 mg total) by mouth 2 (two) times daily.   cyclobenzaprine (FLEXERIL) 10 MG tablet Take 1 tablet (10 mg total) by mouth 3 (three) times daily as needed for muscle spasms.   diclofenac Sodium (VOLTAREN) 1 % GEL Apply 2 g topically 4 (four) times daily as needed (leg pain).   ELIQUIS 2.5 MG TABS tablet Take 1 tablet (2.5 mg total) by mouth 2 (two) times daily. Sig: Daily as presciribed.   furosemide (LASIX) 80 MG tablet Take 1 tablet (80 mg total) by mouth 2 (two) times daily.    gabapentin (NEURONTIN) 300 MG capsule Take 1 capsule (300 mg total) by mouth 2 (two) times daily.   glucose blood (ONETOUCH VERIO) test strip Use to check blood sugar 4 times a day.   HYDROcodone-acetaminophen (NORCO) 10-325 MG tablet Take 1 tablet by mouth every 6 (six) hours as needed.   insulin aspart (NOVOLOG) 100 UNIT/ML FlexPen Inject 3 Units into the skin 3 (three) times daily with meals. As needed only if blood sugar greater then 130 prior to meal.   insulin glargine (LANTUS SOLOSTAR) 100 UNIT/ML Solostar Pen Inject 20 Units into the skin daily.   Insulin Pen Needle (NOVOFINE) 30G X 8 MM MISC Use to inject insulin daily   isosorbide mononitrate (IMDUR) 30 MG 24 hr tablet Take 1 tablet (30 mg total) by mouth daily.   Lancets (ONETOUCH ULTRASOFT) lancets Use to check blood sugar 4 times a day.   losartan (COZAAR) 100 MG tablet Take 1 tablet (100 mg total) by mouth daily.   NIFEdipine (PROCARDIA-XL/NIFEDICAL-XL) 30 MG 24 hr tablet Take 1 tablet (30 mg total) by mouth daily.   rosuvastatin (CRESTOR) 40 MG tablet Take 1 tablet (40 mg total) by mouth daily.   sertraline (ZOLOFT) 50 MG tablet Take 50 mg by mouth daily.   sevelamer carbonate (RENVELA) 800 MG tablet  Take 2 tablets (1,600 mg total) by mouth 3 (three) times daily.   traZODone (DESYREL) 50 MG tablet Take 1 tablet (50 mg total) by mouth at bedtime as needed for sleep.   triamcinolone cream (KENALOG) 0.1 % Apply 1 application topically 2 (two) times daily.   zolpidem (AMBIEN) 10 MG tablet Take 1 tablet (10 mg total) by mouth at bedtime as needed.   [DISCONTINUED] sacubitril-valsartan (ENTRESTO) 49-51 MG Take by mouth.   No facility-administered encounter medications on file as of 11/25/2021.    Allergies (verified) Tramadol   History: Past Medical History:  Diagnosis Date   Chronic kidney disease    Congestive heart failure (HCC)    Diabetes mellitus without complication ([place])    Hyperlipidemia    Hypertension    Past  Surgical History:  Procedure Laterality Date   WOUND DEBRIDEMENT Left    Family History  Problem Relation Age of Onset   Heart failure Mother    Cirrhosis Father    Hypertension Daughter    Social History   Socioeconomic History   Marital status: Single    Spouse name: Not on file   Number of children: Not on file   Years of education: Not on file   Highest education level: Not on file  Occupational History   Not on file  Tobacco Use   Smoking status: Never   Smokeless tobacco: Never  Vaping Use   Vaping Use: Never used  Substance and Sexual Activity   Alcohol use: Not Currently   Drug use: Never   Sexual activity: Not Currently  Other Topics Concern   Not on file  Social History Narrative   Not on file   Social Determinants of Health   Financial Resource Strain: Low Risk  (11/25/2021)   Overall Financial Resource Strain (CARDIA)    Difficulty of Paying Living Expenses: Not hard at all  Food Insecurity: No Food Insecurity (11/25/2021)   Hunger Vital Sign    Worried About Running Out of Food in the Last Year: Never true    Ran Out of Food in the Last Year: Never true  Transportation Needs: No Transportation Needs (11/25/2021)   PRAPARE - Hydrologist (Medical): No    Lack of Transportation (Non-Medical): No  Physical Activity: Insufficiently Active (11/25/2021)   Exercise Vital Sign    Days of Exercise per Week: 2 days    Minutes of Exercise per Session: 60 min  Stress: No Stress Concern Present (11/25/2021)   [place]    Feeling of Stress : Not at all  Social Connections: Moderately Isolated (11/25/2021)   Social Connection and Isolation Panel [NHANES]    Frequency of Communication with Friends and Family: More than three times a week    Frequency of Social Gatherings with Friends and Family: Three times a week    Attends Religious Services: More than 4 times per year     Active Member of Clubs or Organizations: No    Attends Archivist Meetings: Never    Marital Status: Never married    Tobacco Counseling Counseling given: Not Answered   Clinical Intake:  Pre-visit preparation completed: Yes  Pain : 0-10 Pain Score: 10-Worst pain ever Pain Type: Acute pain     BMI - recorded: 32.76 Nutritional Status: BMI > 30  Obese Nutritional Risks: None Diabetes: Yes CBG done?: Yes CBG resulted in Enter/ Edit results?: No Did pt. bring in CBG  monitor from home?: No  How often do you need to have someone help you when you read instructions, pamphlets, or other written materials from your doctor or pharmacy?: 5 - Always What is the last grade level you completed in school?: 8th grade  Diabetic- Yes  Interpreter Needed?: No      Activities of Daily Living     No data to display          Patient Care Team: Venita Lick, NP as PCP - General (Nurse Practitioner) End, Harrell Gave, MD as PCP - Cardiology (Cardiology)  Indicate any recent Medical Services you may have received from other than [facility] providers in the past year (date may be approximate).     Assessment:   This is a routine wellness examination for Arran.  Hearing/Vision screen No results found.  Dietary issues and exercise activities discussed:     Goals Addressed   None   Depression Screen    11/25/2021    2:34 PM 04/10/2021    9:07 AM 04/08/2020    3:56 PM 10/04/2019    1:18 PM  PHQ 2/9 Scores  PHQ - 2 Score 0 0 0 0  PHQ- 9 Score 0  0     Fall Risk    11/25/2021    2:34 PM 04/08/2020    3:56 PM 10/04/2019    1:18 PM  [place] in the past year? 0 0 0  Number falls in past yr: 0  0  Injury with Fall? 0  0  Risk for fall due to : No Fall Risks    Follow up Falls evaluation completed  Falls evaluation completed    [place]:  Any stairs in or around the home? No If so, are there any without  handrails? No  Home free of loose throw rugs in walkways, pet beds, electrical cords, etc? Yes  Adequate lighting in your home to reduce risk of falls? Yes   ASSISTIVE DEVICES UTILIZED TO PREVENT FALLS:  Life alert? No  Use of a cane, walker or w/c? No  Grab bars in the bathroom? No  Shower chair or bench in shower? No  Elevated toilet seat or a handicapped toilet? No   TIMED UP AND GO:  Was the test performed? No .   Cognitive Function:        11/25/2021    2:26 PM  6CIT Screen  What Year? 0 points  What month? 0 points  What time? 0 points  Count back from 20 4 points  Months in reverse 4 points  Repeat phrase 6 points  Total Score 14 points    Immunizations Immunization History  Administered Date(s) Administered   Hepatitis B, adult 04/27/2017, 05/25/2017, 06/22/2017, 11/23/2017, 04/26/2018   Hepb-cpg 08/17/2019, 09/26/2019, 10/19/2019, 11/21/2019   Influenza,inj,Quad PF,6+ Mos 12/03/2018, 12/19/2019, 07/04/2020, 12/24/2020   Janssen (J&J) SARS-COV-2 Vaccination 05/26/2019   Pfizer Covid-19 Vaccine Bivalent Booster 59yr & up 01/30/2021   Pneumococcal Conjugate-13 07/11/2020   Pneumococcal Polysaccharide-23 08/20/2020   Td 09/28/2019    TDAP status: Up to date  Flu Vaccine status: Due, Education has been provided regarding the importance of this vaccine. Advised may receive this vaccine at local pharmacy or Health Dept. Aware to provide a copy of the vaccination record if obtained from local pharmacy or Health Dept. Verbalized acceptance and understanding.  Pneumococcal vaccine status: Up to date  Covid-19 vaccine status: Information provided on how to obtain vaccines.  Qualifies for Shingles Vaccine? Yes   Zostavax completed No   Shingrix Completed?: No.    Education has been provided regarding the importance of this vaccine. Patient has been advised to call insurance company to determine out of pocket expense if they have not yet received this vaccine.  Advised may also receive vaccine at local pharmacy or Health Dept. Verbalized acceptance and understanding.  Screening Tests Health Maintenance  Topic Date Due   OPHTHALMOLOGY EXAM  Never done   INFLUENZA VACCINE  10/14/2021   COVID-19 Vaccine (3 - Janssen risk series) 12/11/2021 (Originally 03/27/2021)   Zoster Vaccines- Shingrix (1 of 2) 02/24/2022 (Originally 07/25/1980)   COLONOSCOPY (Pts 45-23yr Insurance coverage will need to be confirmed)  04/10/2022 (Originally 07/26/2006)   HEMOGLOBIN A1C  05/26/2022   FOOT EXAM  06/11/2022   TETANUS/TDAP  09/27/2029   Hepatitis C Screening  Completed   HIV Screening  Completed   HPV VACCINES  Aged Out    Health Maintenance  Health Maintenance Due  Topic Date Due   OPHTHALMOLOGY EXAM  Never done   INFLUENZA VACCINE  10/14/2021    Colon Cancer Screening: patient refused  Lung Cancer Screening: (Low Dose CT Chest recommended if Age 60-80years, 30 pack-year currently smoking OR have quit w/in 15years.) does not qualify.   Lung Cancer Screening Referral: N/A  Additional Screening:  Hepatitis C Screening: does qualify; Completed 04/10/21  Vision Screening: Recommended annual ophthalmology exams for early detection of glaucoma and other disorders of the eye. Is the patient up to date with their annual eye exam?  No  Who is the provider or what is the name of the office in which the patient attends annual eye exams? N/A If pt is not established with a provider, would they like to be referred to a provider to establish care? No .   Dental Screening: Recommended annual dental exams for proper oral hygiene  Community Resource Referral / Chronic Care Management: CRR required this visit?  No   CCM required this visit?  No      Plan:     I have personally reviewed and noted the following in the patient's chart:   Medical and social history Use of alcohol, tobacco or illicit drugs  Current medications and supplements including opioid  prescriptions. Patient is not currently taking opioid prescriptions. Functional ability and status Nutritional status Physical activity Advanced directives List of other physicians Hospitalizations, surgeries, and ER visits in previous 12 months Vitals Screenings to include cognitive, depression, and falls Referrals and appointments  In addition, I have reviewed and discussed with patient certain preventive protocols, quality metrics, and best practice recommendations. A written personalized care plan for preventive services as well as general preventive health recommendations were provided to patient.     BGeorgina Peer [place]regon  11/25/2021   Nurse Notes: Face to Face 30 minutes    Mr. BKurka, Thank you for taking time to come for your Medicare Wellness Visit. I appreciate your ongoing commitment to your health goals. Please review the following plan we discussed and let me know if I can assist you in the future.   These are the goals we discussed:  Goals      PharmD CCM "My Dad doesn't take care of himself" Pt's daughter     CARE PLAN ENTRY (see longitudinal plan of care for additional care plan information)  Current Barriers:  Chronic Disease Management support, education, and care coordination needs related to Hypertension, Diabetes, Heart  Failure, Coronary Artery Disease, and chronic pain  and ESRD on dialysis.   Hypertension, Heart failure BP Readings from Last 3 Encounters:  10/04/19 112/76  Pharmacist Clinical Goal(s): Over the next 30 days, patient will work with PharmD and providers to achieve BP goal <140/90 Current regimen:  Amiodarone 2771m bid Eliquis 2.582mqhs Bumetanide 71m57mid Carvedilol 3.125m87md Furosemide 80 mg bid Isosorbide Mononitrate 30 mg  Losartan 100 mg qd Nifedipine XL 30 mg qd Interventions: I originally spoke with Mr. BrowGin8/9/21 but he was unable to provide any information regarding his medications. MichSharyn Lull not at home so we  rescheduled.  Comprehensive medication review performed, medication list updated in electronic medical record Inter-disciplinary care team collaboration (see longitudinal plan of care)  Spoke with patient's daughter, MichSharyn Lulle confirmed that Mr. BrowLottman not read. She expressed frustration regarding Mr.Kervin's lifestyle choices. She states he drinks up to 2-3 large regular Gatorades daily , regular soda and beer and mixed drinks when he sees it in the home. She reports getting calls from dialysis that "his levels are up and he's going to end up in the hospital". She states she gives him medicines but does not use a pill box. He is in charge of his own Levemir but "hates needles and doesn't take it".  She expresses concern for his health but also respects his autonomy as an adult and she has her own children to care for.Provided empathetic listening and answered all her questions. She requests "a diet coach" for patient. RNCM is scheduled to see patient later this month.I She states he needs a blood pressure monitor and a blood glucose monitor as he doesn't have either. Will coordinate with provider [facility] self care activities - Over the next 60 days, patient will: Obtain BP monitor Check BP 3-4 times weekly, document, and provide at future appointments Weigh daily and call office is >2 lb change in a day or >5 lb change in a week Ensure daily salt intake < 2000 mg/day  Check your blood pressure ~3-4 times weekly. Make sure you are sitting down for at least 5 minutes before, resting calmly, with your feet flat on the floor. Write down these readings to review in the future. Reduce intake of sugary drinks and maintain fluid restrictions per dialysis recommendations. Follow DASH eating plan Follow up with cardiology   Hyperlipidemia Lab values not available in chart Pharmacist Clinical Goal(s): Over the next 60 days, patient will work  with PharmD and providers to achieve LDL goal < 70 Current regimen:  Rosuvastatin 40 mg daily Interventions: Comprehensive medication review performed, medication list updated in electronic medical record Inter-disciplinary care team collaboration (see longitudinal plan of care) Check your blood sugars twice daily - fasting, and 2 hours after supper. Record these readings so that Pam and I can review with you at our future calls. Bring these readings with you to appointments. For an A1c of <7%, we want to see fasting sugars <130 and 2 hour after meal sugars <180.  Patient self care activities - Over the next  days, patient will: Attend follow up appointments Focus on diet and lifestyle measures  Diabetes Last A1c 6.4% in June  Pharmacist Clinical Goal(s): Over the next 60 days, patient will work with PharmD and providers to maintain A1c goal <7% Current regimen:  Levemir 20 units daily Interventions: Comprehensive medication review performed, medication list updated in electronic medical record Inter-disciplinary care team  collaboration (see longitudinal plan of care) Patient self care activities - Over the next days, patient will:  Check your blood sugars twice daily - fasting, and 2 hours after supper. Record these readings so that Pam RN case manager and I can review with you at our future calls. Bring these readings with you to appointments. For an A1c of <7%, we want to see fasting sugars <130 and 2 hour after meal sugars <180. document, and provide at future appointments Contact provider with any episodes of hypoglycemia    Medication management Pharmacist Clinical Goal(s): Over the next 60 days, patient will work with PharmD and providers to achieve optimal medication adherence Current pharmacy: [facility]  Initial goal documentation      RNCM: Manage Chronic Pain     Timeframe:  Short-Term Goal Priority:  High Start Date:                             Expected End  Date:           07-13-2020            Follow Up Date 06-07-2020   - call for medicine refill 2 or 3 days before it runs out - develop a personal pain management plan - keep track of prescription refills - plan exercise or activity when pain is best controlled - track times pain is worst and when it is best - track what makes the pain worse and what makes it better - work slower and less intense when having pain    Why is this important?   Day-to-day life can be hard when you have chronic pain.  Pain medicine is just one piece of the treatment puzzle.  You can try these action steps to help you manage your pain.    Notes: Sees specialist on 04-15-2020 for evaluation and treatment      RNCM: Monitor and Manage My Blood Sugar-Diabetes Type 2     Timeframe:  Long-Range Goal Priority:  High Start Date:                             Expected End Date:                       Follow Up Date 06-07-2020   - check blood sugar at prescribed times - check blood sugar before and after exercise - check blood sugar if I feel it is too high or too low - enter blood sugar readings and medication or insulin into daily log    Why is this important?   Checking your blood sugar at home helps to keep it from getting very high or very low.  Writing the results in a diary or log helps the doctor know how to care for you.  Your blood sugar log should have the time, date and the results.  Also, write down the amount of insulin or other medicine that you take.  Other information, like what you ate, exercise done and how you were feeling, will also be helpful.     Notes: Does not check blood sugars at home but does have blood sugars checked at dialysis        This is a list of the screening recommended for you and due dates:  Health Maintenance  Topic Date Due   Eye exam for diabetics  Never done  Flu Shot  10/14/2021   COVID-19 Vaccine (3 - Janssen risk series) 12/11/2021*   Zoster (Shingles) Vaccine  (1 of 2) 02/24/2022*   Colon Cancer Screening  04/10/2022*   Hemoglobin A1C  05/26/2022   Complete foot exam   06/11/2022   Tetanus Vaccine  09/27/2029   Hepatitis C Screening: USPSTF Recommendation to screen - Ages 18-79 yo.  Completed   HIV Screening  Completed   HPV Vaccine  Aged Out  *Topic was postponed. The date shown is not the original due date.

## 2021-11-25 NOTE — Assessment & Plan Note (Signed)
Ongoing, reports taking Norco only after dialysis due to discomfort.  Controlled substance contract up to date, unable to void due to dialysis, will consider pain management referral if frequent use of Norco.  Refill as needed and will have more frequent visits if needs increased refills.

## 2021-11-25 NOTE — Patient Instructions (Signed)
Health Maintenance, Male Adopting a healthy lifestyle and getting preventive care are important in promoting health and wellness. Ask your health care provider about: The right schedule for you to have regular tests and exams. Things you can do on your own to prevent diseases and keep yourself healthy. What should I know about diet, weight, and exercise? Eat a healthy diet  Eat a diet that includes plenty of vegetables, fruits, low-fat dairy products, and lean protein. Do not eat a lot of foods that are high in solid fats, added sugars, or sodium. Maintain a healthy weight Body mass index (BMI) is a measurement that can be used to identify possible weight problems. It estimates body fat based on height and weight. Your health care provider can help determine your BMI and help you achieve or maintain a healthy weight. Get regular exercise Get regular exercise. This is one of the most important things you can do for your health. Most adults should: Exercise for at least 150 minutes each week. The exercise should increase your heart rate and make you sweat (moderate-intensity exercise). Do strengthening exercises at least twice a week. This is in addition to the moderate-intensity exercise. Spend less time sitting. Even light physical activity can be beneficial. Watch cholesterol and blood lipids Have your blood tested for lipids and cholesterol at 60 years of age, then have this test every 5 years. You may need to have your cholesterol levels checked more often if: Your lipid or cholesterol levels are high. You are older than 60 years of age. You are at high risk for heart disease. What should I know about cancer screening? Many types of cancers can be detected early and may often be prevented. Depending on your health history and family history, you may need to have cancer screening at various ages. This may include screening for: Colorectal cancer. Prostate cancer. Skin cancer. Lung  cancer. What should I know about heart disease, diabetes, and high blood pressure? Blood pressure and heart disease High blood pressure causes heart disease and increases the risk of stroke. This is more likely to develop in people who have high blood pressure readings or are overweight. Talk with your health care provider about your target blood pressure readings. Have your blood pressure checked: Every 3-5 years if you are 18-39 years of age. Every year if you are 40 years old or older. If you are between the ages of 65 and 75 and are a current or former smoker, ask your health care provider if you should have a one-time screening for abdominal aortic aneurysm (AAA). Diabetes Have regular diabetes screenings. This checks your fasting blood sugar level. Have the screening done: Once every three years after age 45 if you are at a normal weight and have a low risk for diabetes. More often and at a younger age if you are overweight or have a high risk for diabetes. What should I know about preventing infection? Hepatitis B If you have a higher risk for hepatitis B, you should be screened for this virus. Talk with your health care provider to find out if you are at risk for hepatitis B infection. Hepatitis C Blood testing is recommended for: Everyone born from 1945 through 1965. Anyone with known risk factors for hepatitis C. Sexually transmitted infections (STIs) You should be screened each year for STIs, including gonorrhea and chlamydia, if: You are sexually active and are younger than 60 years of age. You are older than 60 years of age and your   health care provider tells you that you are at risk for this type of infection. Your sexual activity has changed since you were last screened, and you are at increased risk for chlamydia or gonorrhea. Ask your health care provider if you are at risk. Ask your health care provider about whether you are at high risk for HIV. Your health care provider  may recommend a prescription medicine to help prevent HIV infection. If you choose to take medicine to prevent HIV, you should first get tested for HIV. You should then be tested every 3 months for as long as you are taking the medicine. Follow these instructions at home: Alcohol use Do not drink alcohol if your health care provider tells you not to drink. If you drink alcohol: Limit how much you have to 0-2 drinks a day. Know how much alcohol is in your drink. In the [place] one drink equals one 12 oz bottle of beer (355 mL), one 5 oz glass of wine (148 mL), or one 1 oz glass of hard liquor (44 mL). Lifestyle Do not use any products that contain nicotine or tobacco. These products include cigarettes, chewing tobacco, and vaping devices, such as e-cigarettes. If you need help quitting, ask your health care provider. Do not use street drugs. Do not share needles. Ask your health care provider for help if you need support or information about quitting drugs. General instructions Schedule regular health, dental, and eye exams. Stay current with your vaccines. Tell your health care provider if: You often feel depressed. You have ever been abused or do not feel safe at home. Summary Adopting a healthy lifestyle and getting preventive care are important in promoting health and wellness. Follow your health care provider's instructions about healthy diet, exercising, and getting tested or screened for diseases. Follow your health care provider's instructions on monitoring your cholesterol and blood pressure. This information is not intended to replace advice given to you by your health care provider. Make sure you discuss any questions you have with your health care provider. Document Revised: 07/22/2020 Document Reviewed: 07/22/2020 Elsevier Patient Education  2023 Elsevier Inc.  

## 2021-11-25 NOTE — Assessment & Plan Note (Signed)
Chronic, stable, euvolemic today.  Continue current medication regimen and adjust as needed.  Continue collaboration with cardiology. Have highly recommend he schedule follow-up with cardiology, last visit August 2021. - Reminded to call for an overnight weight gain of >2 pounds or a weekly weight weight of >5 pounds - not adding salt to his food and has been reading food labels. Reviewed the importance of keeping daily sodium intake to 2000mg  daily  - Avoid NSAIDS

## 2021-11-25 NOTE — Assessment & Plan Note (Signed)
Chronic, ongoing.  Continue current medication regimen and adjust as needed. Lipid panel today. 

## 2021-11-25 NOTE — Assessment & Plan Note (Signed)
Chronic, ongoing with ESRD.  Continue collaboration with dialysis team and nephrology, attempt to obtain recent notes from [facility]. 

## 2021-11-25 NOTE — Assessment & Plan Note (Signed)
Chronic, ongoing.  Will continue Ambien and Trazodone, reviewed PDMP and has been taking this.  Controlled substance contract up to date.  Adjust regimen as needed.  Return in 3 months.

## 2021-11-25 NOTE — Assessment & Plan Note (Signed)
New wound to right shin, concern for eschar to outer aspect -- ?wound depth present under area.  Will place referral urgent to wound care, discussed at length with patient -- important to schedule.  Santyl ordered, which worked well on past wounds.  Referral to vascular, placed in past but did not attend, new one placed today and discussed at length importance of attending to check blood flow in legs.

## 2021-11-25 NOTE — Assessment & Plan Note (Signed)
Chronic, ongoing with A1c 5.6% today.  At goal.  Continue current medication regimen and adjust as needed.  Recommend he monitor BS at least a couple times a day and document for provider + focus on diabetic diet.  CCM referral is in place.  Return to office in 3 months for follow-up.  He was unable to obtain urine sample today for ALB check.

## 2021-11-25 NOTE — Assessment & Plan Note (Signed)
BMI 32.76 with T2DM, ESRD.  Recommended eating smaller high protein, low fat meals more frequently and exercising 30 mins a day 5 times a week with a goal of 10-15lb weight loss in the next 3 months. Patient voiced their understanding and motivation to adhere to these recommendations.

## 2021-11-26 ENCOUNTER — Other Ambulatory Visit: Payer: Self-pay | Admitting: Nurse Practitioner

## 2021-11-26 ENCOUNTER — Telehealth: Payer: Self-pay | Admitting: Nurse Practitioner

## 2021-11-26 LAB — LIPID PANEL W/O CHOL/HDL RATIO
Cholesterol, Total: 186 mg/dL (ref 100–199)
HDL: 50 mg/dL (ref >39)
LDL Chol Calc (NIH): 114 mg/dL — ABNORMAL HIGH (ref 0–99)
Triglycerides: 121 mg/dL (ref 0–149)
VLDL Cholesterol Cal: 22 mg/dL (ref 5–40)

## 2021-11-26 MED ORDER — SANTYL 250 UNIT/GM EX OINT: 1.0000 | TOPICAL_OINTMENT | Freq: Every day | CUTANEOUS | 0 refills | Status: DC

## 2021-11-26 NOTE — Progress Notes (Signed)
Please let Kaveh know his labs have returned and LDL, bad cholesterol is a little elevated this check. Please ensure you are taking your Rosuvastatin daily for cholesterol lowering.  Thank you!!  If any questions let me know:)  Have a wonderful day!!

## 2021-11-26 NOTE — Telephone Encounter (Signed)
Copied from [place] [id number]. Topic: General - Other >> Nov 25, 2021  4:45 PM Everette C wrote: Reason for CRM: Loma Sousa with the patient's pharmacy has called regarding their recent prescription for collagenase (SANTYL) 250 UNIT/GM ointment [[id number]]   The prescription needs to be for a 30g tube of the medication and the size of the wound must be included   The manufacturer is also requesting to know if the prescription is for a chronic wound or a burn   Please contact Wells Guiles with the patient's pharmacy when possible

## 2021-11-27 MED ORDER — SANTYL 250 UNIT/GM EX OINT: 1.0000 | TOPICAL_OINTMENT | Freq: Every day | CUTANEOUS | 0 refills | Status: DC

## 2021-11-27 NOTE — Addendum Note (Signed)
Addended by: [name] T on: 11/27/2021 07:19 PM   Modules accepted: Orders

## 2021-11-27 NOTE — Telephone Encounter (Signed)
Requested medication (s) are due for refill today: {no  Requested medication (s) are on the active medication list: yes  Last refill:  11/26/21  Future visit scheduled: yes  Notes to clinic:  Unable to refill per protocol, last refill by provider 11/26/21. Pharmacy request: Please clarify the directions  for this prescription. is width 3cm and length 1.5?  also is this a 30 day supply.  Routing for approval.     Requested Prescriptions  Pending Prescriptions Disp Refills   SANTYL 250 UNIT/GM ointment [Pharmacy Med Name: SANTYL 250/GM       OIN] 30 g 0    Sig: APPLY TOPICALLY DAILY FOR ULCERATION TO LOWER RIGHT SHIN     Dermatology:  Other Passed - 11/26/2021  7:22 PM      Passed - Valid encounter within last 12 months    Recent Outpatient Visits           2 days ago Type 2 diabetes mellitus with ESRD (end-stage renal disease) ([place])   [place] Cannady, Jolene T, NP   1 month ago Allergic contact dermatitis, unspecified trigger   [place] Kathrine Haddock, NP   3 months ago Tingling pain   [facility] Mecum, Erin E, PA-C   3 months ago Tingling pain   [facility] Mecum, Erin E, PA-C   6 months ago Vasculitic ulcer of lower extremity ([place])   [place], Barbaraann Faster, NP       Future Appointments             In 2 weeks Cannady, Barbaraann Faster, NP [facility]

## 2021-12-03 ENCOUNTER — Telehealth: Payer: Self-pay | Admitting: Nurse Practitioner

## 2021-12-03 NOTE — Telephone Encounter (Signed)
Noted  

## 2021-12-03 NOTE — Telephone Encounter (Signed)
Copied from [place] [id number]. Topic: General - Inquiry >> Dec 03, 2021  3:45 PM Erskine Squibb wrote: Reason for CRM: Almyra Free the patients Case Manager with [facility] called in to let the provider know her information for any future care coordinator needs. Please assist further

## 2021-12-16 ENCOUNTER — Ambulatory Visit: Payer: Medicare Other | Admitting: Podiatry

## 2021-12-16 ENCOUNTER — Ambulatory Visit: Payer: Medicare Other | Admitting: Nurse Practitioner

## 2021-12-24 ENCOUNTER — Ambulatory Visit (INDEPENDENT_AMBULATORY_CARE_PROVIDER_SITE_OTHER): Payer: Medicare Other | Admitting: Nurse Practitioner

## 2021-12-24 ENCOUNTER — Ambulatory Visit: Payer: Self-pay

## 2021-12-24 ENCOUNTER — Encounter: Payer: Self-pay | Admitting: Nurse Practitioner

## 2021-12-24 VITALS — BP 100/72 | HR 87 | Temp 97.8°F | Wt 252.8 lb

## 2021-12-24 DIAGNOSIS — L97911 Non-pressure chronic ulcer of unspecified part of right lower leg limited to breakdown of skin: Secondary | ICD-10-CM

## 2021-12-24 MED ORDER — SANTYL 250 UNIT/GM EX OINT: 1.0000 | TOPICAL_OINTMENT | Freq: Every day | CUTANEOUS | 0 refills | Status: DC

## 2021-12-24 NOTE — Telephone Encounter (Signed)
  Chief Complaint: severe tight calf and ankle pain Symptoms: pt stated a "hole" developed and is scabbed over now.Pt stated that the surrounding area around scab is red with bloody drainage, mild swelling at ankle Frequency: 1 week ago Pertinent Negatives: Patient denies fever, Disposition: [] ED /[] Urgent Care (no appt availability in office) / [x] Appointment(In office/virtual)/ []  [place] [facility]/ [] Home Care/ [] Refused Recommended Disposition /[] [place] [facility]/ []  Follow-up with PCP Additional Notes: pt given 1320 appt- pt in Hemodialysis until noon given a  1320 appt Reason for Disposition  [1] SEVERE pain (e.g., excruciating, unable to do any normal activities) AND [2] not improved after 2 hours of pain medicine    Pt in dialysis until noon  Answer Assessment - Initial Assessment Questions 1. ONSET: "When did the pain start?"      1 week 2. LOCATION: "Where is the pain located?"     Calf and ankle right leg 3. PAIN: "How bad is the pain?"    (Scale 1-10; or mild, moderate, severe)   -  MILD (1-3): doesn't interfere with normal activities    -  MODERATE (4-7): interferes with normal activities (e.g., work or school) or awakens from sleep, limping    -  SEVERE (8-10): excruciating pain, unable to do any normal activities, unable to walk     severe 4. WORK OR EXERCISE: "Has there been any recent work or exercise that involved this part of the body?"      no 5. CAUSE: "What do you think is causing the leg pain?"     Hole in with scab - came up on its own- red around hole- drainage bloody drainage. Mild swelling- dark like a scab on hole 6. OTHER SYMPTOMS: "Do you have any other symptoms?" (e.g., chest pain, back pain, breathing difficulty, swelling, rash, fever, numbness, weakness)     no 7. PREGNANCY: "Is there any chance you are pregnant?" "When was your last menstrual period?"     na  Protocols used: Leg Pain-A-AH

## 2021-12-24 NOTE — Assessment & Plan Note (Signed)
New venous ulcer noted to anterior RLE. Previous wound/ulcer observed on 9/12 to posterior LLE healed.  Pt strongly encouraged again to make and keep appts to both wound care and vascular.  Care coordinator asked to assist with scheduling for patient due to transportation concerns and respect to dialysis schedule (MWF). Sanyl ointment reordered d/t positive effect on wounds in the past. Pt encouraged to notify and return to clinic if wound continues to worsen, he is febrile, notes purulent drainage, or increases swelling around the wound.

## 2021-12-24 NOTE — Progress Notes (Signed)
BP 100/72   Pulse 87   Temp 97.8 F (36.6 C) (Oral)   Wt 114.7 kg   SpO2 99%   BMI 31.60 kg/m    Subjective:    [name], male    DOB: 26-Sep-1960, 61 y.o.   MRN: [id number]  HPI: Adam Howell is a 61 y.o. male  [place].  ASSESSMENT AND PLAN OF CARE REVIEWED WITH STUDENT, AGREE WITH ABOVE FINDINGS AND PLAN.   Chief Complaint  Patient presents with   Leg Pain    Denies recent fall/injury. Reports open wound x 1 week. Patient would like refill on zoloft and ambien. No OTC meds for wound    LEG PAIN Duration: 1 weeks Pain: yes Severity: 10/10  Quality:  sharp, aching, and throbbing Location:  Right Lower Leg Bilateral:  no Onset: sudden Frequency: constant Time of  day:  All-day pain, worse at night time  Sudden unintentional leg jerking:   no Paresthesias:   yes intermittently Decreased sensation:  yes Weakness:   yes Insomnia:   yes Fatigue:   no Alleviating factors: None;  Aggravating factors: Lying or keeping leg in dependent position Status: worse Treatments attempted: tried the voltaren gel, no relief  Pt recently seen ~ 1 month ago with Jolene relating to LLE wound, where referrals for both wound care and vascular were discussed and encouraged. Today, pt states he has not seen either discipline d/t transportation issues. Wound to posterior LLE healed.   2.5 cm x 3.5 cm   Relevant past medical, surgical, family and social history reviewed and updated as indicated. Interim medical history since our last visit reviewed. Allergies and medications reviewed and updated.  Review of Systems  Constitutional:  Positive for activity change and fatigue. Negative for chills, diaphoresis and fever.  Respiratory:  Negative for cough and shortness of breath.   Cardiovascular:  Positive for leg swelling. Negative for chest pain.  Musculoskeletal:  Positive for gait problem.  Skin:  Positive for color change and wound.   Neurological:  Positive for numbness. Negative for dizziness.  Psychiatric/Behavioral:  Positive for sleep disturbance. Negative for behavioral problems. The patient is not nervous/anxious.     Per HPI unless specifically indicated above     Objective:    BP 100/72   Pulse 87   Temp 97.8 F (36.6 C) (Oral)   Wt 114.7 kg   SpO2 99%   BMI 31.60 kg/m   Wt Readings from Last 3 Encounters:  12/24/21 114.7 kg  11/25/21 118.9 kg  11/25/21 118.9 kg    Physical Exam Cardiovascular:     Rate and Rhythm: Normal rate.     Pulses: Normal pulses.  Pulmonary:     Effort: Pulmonary effort is normal. No respiratory distress.     Breath sounds: Normal breath sounds.  Musculoskeletal:        General: Tenderness present.     Right lower leg: Edema (Non-pitting) present.  Skin:    General: Skin is warm and dry.     Capillary Refill: Capillary refill takes 2 to 3 seconds.     Findings: Wound present.          Comments: Dry, flaky skin.   Neurological:     Mental Status: [name] and oriented to person, place, and time.  Psychiatric:        Attention and Perception: Attention and perception normal.        Mood and Affect: Affect  is flat.        Behavior: Behavior normal. Behavior is cooperative.        Cognition and Memory: Cognition and memory normal.        Judgment: Judgment normal.     Results for orders placed or performed in visit on 11/25/21  Bayer DCA Hb A1c Waived  Result Value Ref Range   HB A1C (BAYER DCA - WAIVED) 5.6 4.8 - 5.6 %  Lipid Panel w/o Chol/HDL Ratio  Result Value Ref Range   Cholesterol, Total 186 100 - 199 mg/dL   Triglycerides 121 0 - 149 mg/dL   HDL 50 >39 mg/dL   VLDL Cholesterol Cal 22 5 - 40 mg/dL   LDL Chol Calc (NIH) 114 (H) 0 - 99 mg/dL      Assessment & Plan:   Problem List Items Addressed This Visit       Musculoskeletal and Integument   Skin ulcer of right lower leg, limited to breakdown of skin (HCC) - Primary    New venous  ulcer noted to anterior RLE. Previous wound/ulcer observed on 9/12 to posterior LLE healed.  Pt strongly encouraged again to make and keep appts to both wound care and vascular.  Care coordinator asked to assist with scheduling for patient due to transportation concerns and respect to dialysis schedule (MWF). Sanyl ointment reordered d/t positive effect on wounds in the past. Pt encouraged to notify and return to clinic if wound continues to worsen, he is febrile, notes purulent drainage, or increases swelling around the wound.         Follow up plan: Return in about 2 weeks (around 01/07/2022) for wound check.

## 2021-12-25 ENCOUNTER — Other Ambulatory Visit: Payer: Self-pay | Admitting: Nurse Practitioner

## 2021-12-25 NOTE — Telephone Encounter (Signed)
Pt following up on Rx for HYDROcodone-acetaminophen (NORCO) 10-325 MG tablet He states his leg is hurting real bad and he needs this medication.

## 2021-12-25 NOTE — Telephone Encounter (Signed)
Requested medication (s) are due for refill today: yes  Requested medication (s) are on the active medication list: yes  Last refill:  10/22/21 #60 with 0 RF  Future visit scheduled: 12/30/21, seen yesterday 12/24/21  Notes to clinic:  This medication can not be delegated, please assess. (Pt has called several times to check on this.)       Requested Prescriptions  Pending Prescriptions Disp Refills   HYDROcodone-acetaminophen (NORCO) 10-325 MG tablet 60 tablet 0    Sig: Take 1 tablet by mouth every 6 (six) hours as needed.     Not Delegated - Analgesics:  Opioid Agonist Combinations Failed - 12/25/2021  3:52 PM      Failed - This refill cannot be delegated      Failed - Urine Drug Screen completed in last 360 days      Passed - Valid encounter within last 3 months    Recent Outpatient Visits           Yesterday Skin ulcer of right lower leg, limited to breakdown of skin [facility])   [facility] Jon Billings, NP   1 month ago Type 2 diabetes mellitus with ESRD (end-stage renal disease) ([place])   [place] Cannady, Jolene T, NP   2 months ago Allergic contact dermatitis, unspecified trigger   [place] Kathrine Haddock, NP   4 months ago Tingling pain   [facility] Mecum, Erin E, PA-C   4 months ago Tingling pain   [place], Dani Gobble, PA-C       Future Appointments             In 5 days Cannady, Barbaraann Faster, NP [facility]

## 2021-12-25 NOTE — Telephone Encounter (Signed)
Pt called back to see if RX was sent to the pharmacy / pt was advised it has not been sent yet/ please advise if RX can be refilled today

## 2021-12-25 NOTE — Telephone Encounter (Signed)
Medication Refill - Medication: HYDROcodone-acetaminophen (NORCO) 10-325 MG tablet / pt was seen yesterday   Has the patient contacted their pharmacy? No. (Agent: If no, request that the patient contact the pharmacy for the refill. If patient does not wish to contact the pharmacy document the reason why and proceed with request.) (Agent: If yes, when and what did the pharmacy advise?)  Preferred Pharmacy (with phone number or street name): [facility] [address]), [place] [address]  [place], [place] ([place]) [place] [address]  Phone:  [phone]  Fax:  [phone]  DEA #:  -- Has the patient been seen for an appointment in the last year OR does the patient have an upcoming appointment? Yes.    Agent: Please be advised that RX refills may take up to 3 business days. We ask that you follow-up with your pharmacy.

## 2021-12-26 MED ORDER — HYDROCODONE-ACETAMINOPHEN 10-325 MG PO TABS: 1.0000 | ORAL_TABLET | Freq: Four times a day (QID) | ORAL | 0 refills | Status: DC | PRN

## 2021-12-26 NOTE — Telephone Encounter (Signed)
Requested medication (s) are due for refill today: Yes  Requested medication (s) are on the active medication list: yes    Last refill: 10/22/21  #60  0 refills  Future visit scheduled yes 12/30/21  Notes to clinic:Not delegated, please review. Thank you.  Requested Prescriptions  Pending Prescriptions Disp Refills   HYDROcodone-acetaminophen (NORCO) 10-325 MG tablet 60 tablet 0    Sig: Take 1 tablet by mouth every 6 (six) hours as needed.     Not Delegated - Analgesics:  Opioid Agonist Combinations Failed - 12/26/2021  7:16 AM      Failed - This refill cannot be delegated      Failed - Urine Drug Screen completed in last 360 days      Passed - Valid encounter within last 3 months    Recent Outpatient Visits           2 days ago Skin ulcer of right lower leg, limited to breakdown of skin ([place])   [facility] Jon Billings, NP   1 month ago Type 2 diabetes mellitus with ESRD (end-stage renal disease) ([place])   [place] Cannady, Jolene T, NP   2 months ago Allergic contact dermatitis, unspecified trigger   [place] Kathrine Haddock, NP   4 months ago Tingling pain   [facility] Mecum, Erin E, PA-C   4 months ago Tingling pain   [place], Dani Gobble, PA-C       Future Appointments             In 4 days Cannady, Barbaraann Faster, NP [facility]

## 2021-12-28 NOTE — Patient Instructions (Signed)
Venous Ulcer A venous ulcer is a shallow sore on your lower leg. Venous ulcer is the most common type of lower leg ulcer. You may have venous ulcers on one leg or on both legs. This condition most often develops around your ankles. This type of ulcer may last for a long time (chronic ulcer) or it may return often (recurrent ulcer). What are the causes? This condition is caused by poor blood flow in your legs. The poor flow causes blood to pool in your legs. This can break the skin, causing an ulcer. What increases the risk? You are more likely to develop this condition if: You are 65 years of age or older. You are male. You are overweight. You are not active. You have had a leg ulcer in the past. You have varicose veins. You have clots in your lower leg veins (deep vein thrombosis). You have inflammation of your leg veins (phlebitis). You have recently been pregnant. You smoke. What are the signs or symptoms? The main symptom of this condition is an open sore near your ankle. Other symptoms may include: Swelling. Thick skin. Fluid coming from the ulcer. Bleeding. Itching. Pain and swelling. This gets worse when you stand up and feels better when you raise your leg. Blotchy skin. Dark skin. How is this treated? This condition may be treated by: Keeping your leg raised (elevated). Wearing a type of bandage or stocking to keep pressure (compression) on the veins of your leg. Taking medicines, including antibiotic medicines. Cleaning your ulcer and removing any dead tissue from the wound. Using bandages and wraps that have medicines in them to cover your ulcer. Closing the wound using a piece of skin taken from another area of your body (graft). Follow these instructions at home: Medicines Take or apply over-the-counter and prescription medicines only as told by your doctor. If you were prescribed an antibiotic medicine, take it as told by your doctor. Do not stop using the  antibiotic even if you start to feel better. Ask your doctor if you should take aspirin before long trips. Wound care Follow instructions from your doctor about how to take care of your wound. Make sure you: Wash your hands with soap and water before and after you change your bandage (dressing). If you cannot use soap and water, use hand sanitizer. Change your bandage as told by your doctor. If you had a skin graft, leave stitches (sutures) in place. These may need to stay in place for 2 weeks or longer. Ask when you should remove your bandage. If your bandage is dry and sticks to your leg when you try to remove it, moisten or wet the bandage with saline solution or water to make it easier to remove. Once your bandage is off, check your wound each day for signs of infection. Have a caregiver do this for you if you are not able to do it yourself. Check for: More redness, swelling, or pain. More fluid or blood. Warmth. Pus or a bad smell. Activity Do not sit for a long time without moving. Get up to take short walks every 1-2 hours. This is important. Ask for help if you feel weak or unsteady. Ask your doctor what level of activity is safe for you. Rest with your legs raised during the day. If you can, keep your legs above the level of your heart for 30 minutes, 3-4 times a day, or as told by your doctor. Do not sit with your legs crossed. General instructions    Wear elastic stockings, compression stockings, or support hose as told by your doctor. Raise the foot of your bed as told by your doctor. Do not use any products that contain nicotine or tobacco, such as cigarettes, e-cigarettes, and chewing tobacco. If you need help quitting, ask your doctor. Keep all follow-up visits as told by your doctor. This is important. Contact a doctor if: Your ulcer is getting larger or is not healing. Your pain gets worse. Get help right away if: You have more redness, swelling, or pain around your  ulcer. You have more fluid or blood coming from your ulcer. Your ulcer feels warm to the touch. You have pus or a bad smell coming from your ulcer. You have a fever. Summary A venous ulcer is a shallow sore on your lower leg. Follow instructions from your doctor about how to take care of your wound. Check your wound each day for signs of infection. Take over-the-counter and prescription medicines only as told by your doctor. Keep all follow-up visits as told by your doctor. This is important. This information is not intended to replace advice given to you by your health care provider. Make sure you discuss any questions you have with your health care provider. Document Revised: 12/26/2020 Document Reviewed: 12/26/2020 Elsevier Patient Education  2023 [employer]  

## 2021-12-30 ENCOUNTER — Encounter: Payer: Self-pay | Admitting: Nurse Practitioner

## 2021-12-30 ENCOUNTER — Ambulatory Visit (INDEPENDENT_AMBULATORY_CARE_PROVIDER_SITE_OTHER): Payer: Medicare Other | Admitting: Nurse Practitioner

## 2021-12-30 VITALS — BP 114/74 | HR 86 | Temp 97.8°F | Ht 75.0 in | Wt 256.0 lb

## 2021-12-30 DIAGNOSIS — L97911 Non-pressure chronic ulcer of unspecified part of right lower leg limited to breakdown of skin: Secondary | ICD-10-CM | POA: Diagnosis not present

## 2021-12-30 MED ORDER — DOXYCYCLINE HYCLATE 100 MG PO TABS: 100.0000 mg | ORAL_TABLET | Freq: Two times a day (BID) | ORAL | 0 refills | Status: DC

## 2021-12-30 NOTE — Assessment & Plan Note (Signed)
New onset 12/24/21 == deeper on exam today.  Have scheduled with vascular 01/13/22 and provided visit time in office today -- discussed with him need to attend due to concerns for loss of limb if worsening.  Sees wound care in November.  Place referral to home health until then to start ASAP -- recommendations will be obtained from them.  Start Doxycycline 100 MG BID for 10 days.  Return in one week, sooner if worsening symptoms.

## 2021-12-30 NOTE — Progress Notes (Signed)
BP 114/74   Pulse 86   Temp 97.8 F (36.6 C) (Oral)   Ht 6\' 3"  (1.905 m)   Wt 256 lb (116.1 kg)   SpO2 99%   BMI 32.00 kg/m    Subjective:    [name], male    DOB: Mar 17, 1960, 61 y.o.   MRN: [id number]  HPI: Adam Howell is a 61 y.o. male  Chief Complaint  Patient presents with   Skin Ulcer    Patient says the wound on his shin is hurting him like crazy. Patient says the wound doesn't look as if it is healing, but more-so looks as if it is opening. Patient says he has been using the prescription cream that he was prescribed. Patient says the medication isn't helping. Patient says he will take his medication and it is still hurting nothing helping.    Referral   SKIN WOUND Reports there is a wound present to right shin at this time, new onset 12/24/21. Previous wound to same shin healed.  Has history of wounds ongoing, last to left lower leg in February 2023.  Scheduled to see wound care 01/23/22.  Ongoing issue with wounds -- was to see [facility] in November 2021 for similar issue -- he then went back to [place] for a year they did operation on it and cleaned it out + had home health coming in for wound care.  Duration: 12/24/21 Location:  right shin Painful:  irritation Itching: none Onset: sudden Context: not changing Associated signs and symptoms: none History of skin cancer: no History of precancerous skin lesions: no Family history of skin cancer: no  Relevant past medical, surgical, family and social history reviewed and updated as indicated. Interim medical history since our last visit reviewed. Allergies and medications reviewed and updated.  Review of Systems  Constitutional:  Negative for activity change, diaphoresis, fatigue and fever.  Respiratory:  Negative for cough, chest tightness, shortness of breath and wheezing.   Cardiovascular:  Negative for chest pain, palpitations and leg swelling.  Gastrointestinal: Negative.   Endocrine:  Negative for polydipsia, polyphagia and polyuria.  Skin:  Positive for wound.  Neurological: Negative.   Psychiatric/Behavioral: Negative.      Per HPI unless specifically indicated above     Objective:    BP 114/74   Pulse 86   Temp 97.8 F (36.6 C) (Oral)   Ht 6\' 3"  (1.905 m)   Wt 256 lb (116.1 kg)   SpO2 99%   BMI 32.00 kg/m   Wt Readings from Last 3 Encounters:  12/30/21 256 lb (116.1 kg)  12/24/21 252 lb 12.8 oz (114.7 kg)  11/25/21 262 lb 1.6 oz (118.9 kg)    Physical Exam Vitals and nursing note reviewed.  Constitutional:      General: He is awake. He is not in acute distress.    Appearance: He is well-developed and well-groomed. He is morbidly obese. He is not ill-appearing or toxic-appearing.  HENT:     Head: Normocephalic and atraumatic.     Right Ear: Hearing normal. No drainage.     Left Ear: Hearing normal. No drainage.  Eyes:     General: Lids are normal.        Right eye: No discharge.        Left eye: No discharge.     Conjunctiva/sclera: Conjunctivae normal.     Pupils: Pupils are equal, round, and reactive to light.  Neck:     Thyroid: No thyromegaly.  Vascular: No carotid bruit.     Trachea: Trachea normal.  Cardiovascular:     Rate and Rhythm: Normal rate and regular rhythm.     Pulses:          Dorsalis pedis pulses are 1+ on the right side and 1+ on the left side.       Posterior tibial pulses are 1+ on the right side and 1+ on the left side.     Heart sounds: Normal heart sounds, S1 normal and S2 normal. No murmur heard.    No gallop.     Arteriovenous access: Left arteriovenous access is present.    Comments: Decreased hair pattern bilateral legs. Pulmonary:     Effort: Pulmonary effort is normal. No accessory muscle usage or respiratory distress.     Breath sounds: Normal breath sounds.  Abdominal:     General: Bowel sounds are normal. There is no distension.     Palpations: Abdomen is soft.     Tenderness: There is no abdominal  tenderness.  Musculoskeletal:        General: Normal range of motion.     Cervical back: Normal range of motion and neck supple.     Right lower leg: No edema.     Left lower leg: No edema.  Skin:    General: Skin is warm and dry.     Capillary Refill: Capillary refill takes less than 2 seconds.     Findings: No rash.          Comments: Bilateral lower extremities with xerosis.    Neurological:     Mental Status: [name] and oriented to person, place, and time.     Deep Tendon Reflexes: Reflexes are normal and symmetric.  Psychiatric:        Attention and Perception: Attention normal.        Mood and Affect: Mood normal.        Speech: Speech normal.        Behavior: Behavior normal. Behavior is cooperative.        Thought Content: Thought content normal.        Judgment: Judgment normal.    Results for orders placed or performed in visit on 11/25/21  Bayer DCA Hb A1c Waived  Result Value Ref Range   HB A1C (BAYER DCA - WAIVED) 5.6 4.8 - 5.6 %  Lipid Panel w/o Chol/HDL Ratio  Result Value Ref Range   Cholesterol, Total 186 100 - 199 mg/dL   Triglycerides 121 0 - 149 mg/dL   HDL 50 >39 mg/dL   VLDL Cholesterol Cal 22 5 - 40 mg/dL   LDL Chol Calc (NIH) 114 (H) 0 - 99 mg/dL      Assessment & Plan:   Problem List Items Addressed This Visit       Musculoskeletal and Integument   Skin ulcer of right lower leg, limited to breakdown of skin ([place]) - Primary    New onset 12/24/21 == deeper on exam today.  Have scheduled with vascular 01/13/22 and provided visit time in office today -- discussed with him need to attend due to concerns for loss of limb if worsening.  Sees wound care in November.  Place referral to home health until then to start ASAP -- recommendations will be obtained from them.  Start Doxycycline 100 MG BID for 10 days.  Return in one week, sooner if worsening symptoms.      Relevant Orders   Ambulatory referral to Home  Health     Follow up plan: Return  in about 1 week (around 01/06/2022) for Wound check.

## 2021-12-31 ENCOUNTER — Ambulatory Visit: Payer: Self-pay

## 2021-12-31 MED ORDER — TIZANIDINE HCL 2 MG PO CAPS: 2.0000 mg | ORAL_CAPSULE | Freq: Every day | ORAL | 0 refills | Status: DC | PRN

## 2021-12-31 NOTE — Telephone Encounter (Signed)
  Chief Complaint: leg pain Symptoms: RLE pain 10/10 r/t skin ulcer  Frequency: 1-2 weeks Pertinent Negatives: NA Disposition: [] ED /[] Urgent Care (no appt availability in office) / [] Appointment(In office/virtual)/ []  [facility]/ [] Home Care/ [] Refused Recommended Disposition /[] [facility]/ [x]  Follow-up with PCP Additional Notes: pt has had OV on 12/24/21 and yesterday. Pt placed on abx for skin ulcer but states pain is not getting better and taking the Norco for pain and not really giving any relief. Advised pt I would send message back and see if there was something else that can be prescribed for pain. Pt verbalized understanding.   Summary: leg pain   Pt states he is having leg pain and requesting a Rx for pain   Please assist further      Reason for Disposition  [1] SEVERE pain (e.g., excruciating, unable to do any normal activities) AND [2] not improved after 2 hours of pain medicine  Answer Assessment - Initial Assessment Questions 1. ONSET: "When did the pain start?"      1-2 weeks  2. LOCATION: "Where is the pain located?"      RLE has ulcer  3. PAIN: "How bad is the pain?"    (Scale 1-10; or mild, moderate, severe)   -  MILD (1-3): doesn't interfere with normal activities    -  MODERATE (4-7): interferes with normal activities (e.g., work or school) or awakens from sleep, limping    -  SEVERE (8-10): excruciating pain, unable to do any normal activities, unable to walk     10 6. OTHER SYMPTOMS: "Do you have any other symptoms?" (e.g., chest pain, back pain, breathing difficulty, swelling, rash, fever, numbness, weakness)  Protocols used: Leg Pain-A-AH

## 2021-12-31 NOTE — Addendum Note (Signed)
Addended by: [name] T on: 12/31/2021 03:28 PM   Modules accepted: Orders

## 2021-12-31 NOTE — Telephone Encounter (Signed)
Left message for patient to give our office a call back to make sure he is aware of Jolene's recommendations.   OK for PEC to give note if patient calls back.

## 2022-01-01 ENCOUNTER — Telehealth: Payer: Self-pay | Admitting: Nurse Practitioner

## 2022-01-01 MED ORDER — TIZANIDINE HCL 2 MG PO TABS: 2.0000 mg | ORAL_TABLET | Freq: Every day | ORAL | 0 refills | Status: DC | PRN

## 2022-01-01 NOTE — Telephone Encounter (Signed)
Left message for patient to inform him of Jolene's recommendations. Advised patient to give our office a call back if he has any questions or concerns.

## 2022-01-01 NOTE — Telephone Encounter (Signed)
Patient callled in to get medicine for muscle relaxer. He doesn't know the name of it and He states the [facility] told him they only have it in pill form and not capsule. Please call back to discuss.

## 2022-01-02 ENCOUNTER — Encounter: Payer: Self-pay | Admitting: Emergency Medicine

## 2022-01-02 ENCOUNTER — Emergency Department: Payer: Medicare Other

## 2022-01-02 ENCOUNTER — Other Ambulatory Visit: Payer: Self-pay

## 2022-01-02 ENCOUNTER — Inpatient Hospital Stay
Admission: EM | Admit: 2022-01-02 | Discharge: 2022-01-07 | DRG: 592 | Disposition: A | Discharge status: Home Health | Payer: Medicare Other | Attending: Internal Medicine | Admitting: Internal Medicine

## 2022-01-02 DIAGNOSIS — L97909 Non-pressure chronic ulcer of unspecified part of unspecified lower leg with unspecified severity: Secondary | ICD-10-CM | POA: Diagnosis not present

## 2022-01-02 DIAGNOSIS — N2581 Secondary hyperparathyroidism of renal origin: Secondary | ICD-10-CM | POA: Diagnosis present

## 2022-01-02 DIAGNOSIS — G4700 Insomnia, unspecified: Secondary | ICD-10-CM | POA: Diagnosis present

## 2022-01-02 DIAGNOSIS — Z6831 Body mass index (BMI) 31.0-31.9, adult: Secondary | ICD-10-CM

## 2022-01-02 DIAGNOSIS — L0889 Other specified local infections of the skin and subcutaneous tissue: Secondary | ICD-10-CM | POA: Diagnosis present

## 2022-01-02 DIAGNOSIS — N186 End stage renal disease: Secondary | ICD-10-CM | POA: Diagnosis present

## 2022-01-02 DIAGNOSIS — L97911 Non-pressure chronic ulcer of unspecified part of right lower leg limited to breakdown of skin: Secondary | ICD-10-CM | POA: Diagnosis present

## 2022-01-02 DIAGNOSIS — Z794 Long term (current) use of insulin: Secondary | ICD-10-CM | POA: Diagnosis not present

## 2022-01-02 DIAGNOSIS — Z992 Dependence on renal dialysis: Secondary | ICD-10-CM

## 2022-01-02 DIAGNOSIS — I5022 Chronic systolic (congestive) heart failure: Secondary | ICD-10-CM | POA: Diagnosis present

## 2022-01-02 DIAGNOSIS — E1169 Type 2 diabetes mellitus with other specified complication: Secondary | ICD-10-CM | POA: Diagnosis present

## 2022-01-02 DIAGNOSIS — E11622 Type 2 diabetes mellitus with other skin ulcer: Secondary | ICD-10-CM | POA: Diagnosis not present

## 2022-01-02 DIAGNOSIS — L97919 Non-pressure chronic ulcer of unspecified part of right lower leg with unspecified severity: Secondary | ICD-10-CM

## 2022-01-02 DIAGNOSIS — E1122 Type 2 diabetes mellitus with diabetic chronic kidney disease: Secondary | ICD-10-CM | POA: Diagnosis present

## 2022-01-02 DIAGNOSIS — G894 Chronic pain syndrome: Secondary | ICD-10-CM | POA: Diagnosis not present

## 2022-01-02 DIAGNOSIS — I132 Hypertensive heart and chronic kidney disease with heart failure and with stage 5 chronic kidney disease, or end stage renal disease: Secondary | ICD-10-CM | POA: Diagnosis present

## 2022-01-02 DIAGNOSIS — D631 Anemia in chronic kidney disease: Secondary | ICD-10-CM | POA: Diagnosis present

## 2022-01-02 DIAGNOSIS — I959 Hypotension, unspecified: Secondary | ICD-10-CM | POA: Diagnosis not present

## 2022-01-02 DIAGNOSIS — Z7901 Long term (current) use of anticoagulants: Secondary | ICD-10-CM

## 2022-01-02 DIAGNOSIS — Z8249 Family history of ischemic heart disease and other diseases of the circulatory system: Secondary | ICD-10-CM | POA: Diagnosis not present

## 2022-01-02 DIAGNOSIS — E669 Obesity, unspecified: Secondary | ICD-10-CM | POA: Diagnosis present

## 2022-01-02 DIAGNOSIS — E785 Hyperlipidemia, unspecified: Secondary | ICD-10-CM | POA: Diagnosis present

## 2022-01-02 DIAGNOSIS — I4821 Permanent atrial fibrillation: Secondary | ICD-10-CM | POA: Diagnosis present

## 2022-01-02 DIAGNOSIS — Z79899 Other long term (current) drug therapy: Secondary | ICD-10-CM

## 2022-01-02 DIAGNOSIS — E1140 Type 2 diabetes mellitus with diabetic neuropathy, unspecified: Secondary | ICD-10-CM | POA: Diagnosis present

## 2022-01-02 DIAGNOSIS — I482 Chronic atrial fibrillation, unspecified: Secondary | ICD-10-CM | POA: Diagnosis present

## 2022-01-02 DIAGNOSIS — I4891 Unspecified atrial fibrillation: Secondary | ICD-10-CM | POA: Diagnosis present

## 2022-01-02 LAB — BASIC METABOLIC PANEL
Anion gap: 22 — ABNORMAL HIGH (ref 5–15)
BUN: 58 mg/dL — ABNORMAL HIGH (ref 6–20)
CO2: 24 mmol/L (ref 22–32)
Calcium: 8.2 mg/dL — ABNORMAL LOW (ref 8.9–10.3)
Chloride: 89 mmol/L — ABNORMAL LOW (ref 98–111)
Creatinine, Ser: 13.92 mg/dL — ABNORMAL HIGH (ref 0.61–1.24)
GFR, Estimated: 4 mL/min — ABNORMAL LOW (ref >60)
Glucose, Bld: 76 mg/dL (ref 70–99)
Potassium: 4.7 mmol/L (ref 3.5–5.1)
Sodium: 135 mmol/L (ref 135–145)

## 2022-01-02 LAB — CBC WITH DIFFERENTIAL/PLATELET
Abs Immature Granulocytes: 0.04 10*3/uL (ref 0.00–0.07)
Basophils Absolute: 0.1 10*3/uL (ref 0.0–0.1)
Basophils Relative: 2 %
Eosinophils Absolute: 0.3 10*3/uL (ref 0.0–0.5)
Eosinophils Relative: 4 %
HCT: 49.7 % (ref 39.0–52.0)
Hemoglobin: 16.3 g/dL (ref 13.0–17.0)
Immature Granulocytes: 1 %
Lymphocytes Relative: 14 %
Lymphs Abs: 0.9 10*3/uL (ref 0.7–4.0)
MCH: 28.7 pg (ref 26.0–34.0)
MCHC: 32.8 g/dL (ref 30.0–36.0)
MCV: 87.7 fL (ref 80.0–100.0)
Monocytes Absolute: 0.5 10*3/uL (ref 0.1–1.0)
Monocytes Relative: 9 %
Neutro Abs: 4.3 10*3/uL (ref 1.7–7.7)
Neutrophils Relative %: 70 %
Platelets: 203 10*3/uL (ref 150–400)
RBC: 5.67 MIL/uL (ref 4.22–5.81)
RDW: 15.9 % — ABNORMAL HIGH (ref 11.5–15.5)
WBC: 6.1 10*3/uL (ref 4.0–10.5)
nRBC: 0 % (ref 0.0–0.2)

## 2022-01-02 LAB — GLUCOSE, CAPILLARY: Glucose-Capillary: 118 mg/dL — ABNORMAL HIGH (ref 70–99)

## 2022-01-02 LAB — LACTIC ACID, PLASMA
Lactic Acid, Venous: 2 mmol/L (ref 0.5–1.9)
Lactic Acid, Venous: 1.3 mmol/L (ref 0.5–1.9)

## 2022-01-02 LAB — HEPATITIS B SURFACE ANTIGEN: Hepatitis B Surface Ag: NONREACTIVE

## 2022-01-02 MED ORDER — ONDANSETRON HCL 4 MG/2ML IJ SOLN: 4.0000 mg | Freq: Four times a day (QID) | INTRAMUSCULAR | Status: AC | PRN

## 2022-01-02 MED ORDER — SODIUM CHLORIDE 0.9 % IV SOLN
2.0000 g | INTRAVENOUS | Status: DC
  Administered 2022-01-03 – 2022-01-04 (×2): 2 g via INTRAVENOUS
  Filled 2022-01-02 (×3): qty 20

## 2022-01-02 MED ORDER — ZOLPIDEM TARTRATE 5 MG PO TABS: 10.0000 mg | ORAL_TABLET | Freq: Every evening | ORAL | Status: DC | PRN

## 2022-01-02 MED ORDER — PENTAFLUOROPROP-TETRAFLUOROETH EX AERO
INHALATION_SPRAY | CUTANEOUS | Status: AC
  Administered 2022-01-02: 1 via TOPICAL
  Filled 2022-01-02: qty 30

## 2022-01-02 MED ORDER — SERTRALINE HCL 50 MG PO TABS
50.0000 mg | ORAL_TABLET | Freq: Every day | ORAL | Status: DC
  Administered 2022-01-02 – 2022-01-07 (×6): 50 mg via ORAL
  Filled 2022-01-02 (×6): qty 1

## 2022-01-02 MED ORDER — CHLORHEXIDINE GLUCONATE CLOTH 2 % EX PADS
6.0000 | MEDICATED_PAD | Freq: Every day | CUTANEOUS | Status: DC
  Administered 2022-01-03 – 2022-01-07 (×2): 6 via TOPICAL

## 2022-01-02 MED ORDER — ANTICOAGULANT SODIUM CITRATE 4% (200MG/5ML) IV SOLN: 5.0000 mL | Status: DC | PRN

## 2022-01-02 MED ORDER — AMIODARONE HCL 200 MG PO TABS
200.0000 mg | ORAL_TABLET | Freq: Two times a day (BID) | ORAL | Status: DC
  Administered 2022-01-02 – 2022-01-07 (×9): 200 mg via ORAL
  Filled 2022-01-02 (×9): qty 1

## 2022-01-02 MED ORDER — TIZANIDINE HCL 2 MG PO TABS
2.0000 mg | ORAL_TABLET | Freq: Every day | ORAL | Status: DC | PRN
  Administered 2022-01-04 – 2022-01-06 (×2): 2 mg via ORAL
  Filled 2022-01-02 (×3): qty 1

## 2022-01-02 MED ORDER — TRAZODONE HCL 50 MG PO TABS
50.0000 mg | ORAL_TABLET | Freq: Every evening | ORAL | Status: DC | PRN
  Administered 2022-01-02: 50 mg via ORAL
  Filled 2022-01-02: qty 1

## 2022-01-02 MED ORDER — DEXTROSE 50 % IV SOLN: 25.0000 mL | INTRAVENOUS | Status: DC | PRN

## 2022-01-02 MED ORDER — VANCOMYCIN HCL IN DEXTROSE 1-5 GM/200ML-% IV SOLN: 1000.0000 mg | INTRAVENOUS | Status: DC

## 2022-01-02 MED ORDER — ALTEPLASE 2 MG IJ SOLR: 2.0000 mg | Freq: Once | INTRAMUSCULAR | Status: DC | PRN

## 2022-01-02 MED ORDER — ACETAMINOPHEN 325 MG PO TABS
650.0000 mg | ORAL_TABLET | Freq: Four times a day (QID) | ORAL | Status: AC | PRN
  Administered 2022-01-04: 650 mg via ORAL
  Filled 2022-01-02: qty 2

## 2022-01-02 MED ORDER — INSULIN ASPART 100 UNIT/ML IJ SOLN: 0.0000 [IU] | Freq: Three times a day (TID) | INTRAMUSCULAR | Status: DC

## 2022-01-02 MED ORDER — NIFEDIPINE ER OSMOTIC RELEASE 30 MG PO TB24
30.0000 mg | ORAL_TABLET | Freq: Every day | ORAL | Status: DC
  Administered 2022-01-03 – 2022-01-06 (×4): 30 mg via ORAL
  Filled 2022-01-02 (×4): qty 1

## 2022-01-02 MED ORDER — BUMETANIDE 1 MG PO TABS
1.0000 mg | ORAL_TABLET | Freq: Two times a day (BID) | ORAL | Status: DC
  Administered 2022-01-02 – 2022-01-06 (×7): 1 mg via ORAL
  Filled 2022-01-02 (×9): qty 1

## 2022-01-02 MED ORDER — FUROSEMIDE 40 MG PO TABS
80.0000 mg | ORAL_TABLET | Freq: Two times a day (BID) | ORAL | Status: DC
  Administered 2022-01-02 – 2022-01-04 (×4): 80 mg via ORAL
  Filled 2022-01-02 (×4): qty 2

## 2022-01-02 MED ORDER — SODIUM CHLORIDE 0.9 % IV SOLN
2.0000 g | Freq: Once | INTRAVENOUS | Status: AC
  Administered 2022-01-02: 2 g via INTRAVENOUS
  Filled 2022-01-02: qty 20

## 2022-01-02 MED ORDER — HEPARIN SODIUM (PORCINE) 5000 UNIT/ML IJ SOLN: 5000.0000 [IU] | Freq: Three times a day (TID) | INTRAMUSCULAR | Status: DC

## 2022-01-02 MED ORDER — GABAPENTIN 300 MG PO CAPS
300.0000 mg | ORAL_CAPSULE | Freq: Two times a day (BID) | ORAL | Status: DC
  Administered 2022-01-02 – 2022-01-07 (×10): 300 mg via ORAL
  Filled 2022-01-02 (×10): qty 1

## 2022-01-02 MED ORDER — SEVELAMER CARBONATE 800 MG PO TABS
1600.0000 mg | ORAL_TABLET | Freq: Three times a day (TID) | ORAL | Status: DC
  Administered 2022-01-03 – 2022-01-07 (×13): 1600 mg via ORAL
  Filled 2022-01-02 (×13): qty 2

## 2022-01-02 MED ORDER — ACETAMINOPHEN 650 MG RE SUPP: 650.0000 mg | Freq: Four times a day (QID) | RECTAL | Status: AC | PRN

## 2022-01-02 MED ORDER — ISOSORBIDE MONONITRATE ER 30 MG PO TB24
30.0000 mg | ORAL_TABLET | Freq: Every day | ORAL | Status: DC
  Administered 2022-01-02 – 2022-01-05 (×4): 30 mg via ORAL
  Filled 2022-01-02 (×4): qty 1

## 2022-01-02 MED ORDER — LIDOCAINE-PRILOCAINE 2.5-2.5 % EX CREA: 1.0000 | TOPICAL_CREAM | CUTANEOUS | Status: DC | PRN

## 2022-01-02 MED ORDER — MORPHINE SULFATE (PF) 4 MG/ML IV SOLN
4.0000 mg | INTRAVENOUS | Status: DC | PRN
  Administered 2022-01-03: 4 mg via INTRAVENOUS
  Filled 2022-01-02: qty 1

## 2022-01-02 MED ORDER — ONDANSETRON HCL 4 MG PO TABS: 4.0000 mg | ORAL_TABLET | Freq: Four times a day (QID) | ORAL | Status: AC | PRN

## 2022-01-02 MED ORDER — SENNOSIDES-DOCUSATE SODIUM 8.6-50 MG PO TABS: 1.0000 | ORAL_TABLET | Freq: Every evening | ORAL | Status: DC | PRN

## 2022-01-02 MED ORDER — CARVEDILOL 6.25 MG PO TABS
3.1250 mg | ORAL_TABLET | Freq: Two times a day (BID) | ORAL | Status: DC
  Administered 2022-01-03 – 2022-01-07 (×7): 3.125 mg via ORAL
  Filled 2022-01-02 (×8): qty 1

## 2022-01-02 MED ORDER — APIXABAN 2.5 MG PO TABS
2.5000 mg | ORAL_TABLET | Freq: Two times a day (BID) | ORAL | Status: DC
  Administered 2022-01-02 – 2022-01-07 (×9): 2.5 mg via ORAL
  Filled 2022-01-02 (×10): qty 1

## 2022-01-02 MED ORDER — LIDOCAINE HCL (PF) 1 % IJ SOLN: 5.0000 mL | INTRAMUSCULAR | Status: DC | PRN

## 2022-01-02 MED ORDER — INSULIN ASPART 100 UNIT/ML IJ SOLN: 0.0000 [IU] | Freq: Every day | INTRAMUSCULAR | Status: DC

## 2022-01-02 MED ORDER — VANCOMYCIN HCL 750 MG/150ML IV SOLN
750.0000 mg | Freq: Once | INTRAVENOUS | Status: AC
  Administered 2022-01-02: 750 mg via INTRAVENOUS
  Filled 2022-01-02 (×2): qty 150

## 2022-01-02 MED ORDER — PENTAFLUOROPROP-TETRAFLUOROETH EX AERO
1.0000 | INHALATION_SPRAY | CUTANEOUS | Status: DC | PRN
  Filled 2022-01-02: qty 30

## 2022-01-02 MED ORDER — VANCOMYCIN HCL 2000 MG/400ML IV SOLN
2000.0000 mg | Freq: Once | INTRAVENOUS | Status: AC
  Administered 2022-01-02: 2000 mg via INTRAVENOUS
  Filled 2022-01-02: qty 400

## 2022-01-02 MED ORDER — ROSUVASTATIN CALCIUM 10 MG PO TABS
40.0000 mg | ORAL_TABLET | Freq: Every day | ORAL | Status: DC
  Administered 2022-01-02 – 2022-01-07 (×6): 40 mg via ORAL
  Filled 2022-01-02 (×2): qty 4
  Filled 2022-01-02: qty 2
  Filled 2022-01-02 (×4): qty 4

## 2022-01-02 MED ORDER — LOSARTAN POTASSIUM 50 MG PO TABS
100.0000 mg | ORAL_TABLET | Freq: Every day | ORAL | Status: DC
  Administered 2022-01-03 – 2022-01-06 (×4): 100 mg via ORAL
  Filled 2022-01-02 (×4): qty 2

## 2022-01-02 MED ORDER — HEPARIN SODIUM (PORCINE) 1000 UNIT/ML DIALYSIS: 1000.0000 [IU] | INTRAMUSCULAR | Status: DC | PRN

## 2022-01-02 MED ORDER — HYDROCODONE-ACETAMINOPHEN 10-325 MG PO TABS
1.0000 | ORAL_TABLET | Freq: Four times a day (QID) | ORAL | Status: DC | PRN
  Administered 2022-01-02 – 2022-01-03 (×2): 1 via ORAL
  Filled 2022-01-02 (×2): qty 1

## 2022-01-02 MED ORDER — HYDROMORPHONE HCL 1 MG/ML IJ SOLN
0.5000 mg | Freq: Once | INTRAMUSCULAR | Status: AC
  Administered 2022-01-02: 0.5 mg via INTRAVENOUS
  Filled 2022-01-02: qty 0.5

## 2022-01-02 NOTE — Progress Notes (Signed)
Pharmacy- Brief Note (Vancomycin)  Plan: Given that the patient will receive HD today (01/02/2022), expect that ~30% of Vanc loading dose will be removed during HD. Based on PK calculations, will give supplemental dose of Vancomycin 750 mg IV x1 tonight to give adequate coverage through the weekend.   Loading dose Vancomycin 2000 mg IV x1 Vd ~0.85 Cmax: 20.4 Post HD level (estimated): 14.7 Post HD Vancomycin dose 750 mg x1 Post-dose level (estimated): 22.3  Thank you for allowing pharmacy to be a part of this patient's care.  Gretel Acre, PharmD PGY1 Pharmacy Resident 01/02/2022 5:01 PM

## 2022-01-02 NOTE — ED Triage Notes (Signed)
Pt to ED via POV for wound check. Pt states that he has had a wound on his right shin for about a week. Pt diabetic.  Pt states that he wound has a foul smell and there is redish drainage coming from the wound. Pt denies fevers are chills. Pt is a dialysis pt. Pt is currently in NAD.

## 2022-01-02 NOTE — Assessment & Plan Note (Signed)
-   Trazodone 50 mg nightly as needed for sleep, zolpidem 10 mg nightly for sleep

## 2022-01-02 NOTE — Progress Notes (Signed)
Pre hd rn assessment 

## 2022-01-02 NOTE — Progress Notes (Signed)
Pt completed 3.25 hour HD treatment w/ no complications. Alert, vss, report to ED RN. Start: 1600 End: 1928 1541ml fluid removed 73.7L BVP No HD meds ordered 114.4kg post standing weight

## 2022-01-02 NOTE — Progress Notes (Signed)
Post hd rn assessment 

## 2022-01-02 NOTE — Assessment & Plan Note (Signed)
-   Carvedilol 3.125 mg p.o. twice daily, furosemide 80 mg p.o. twice daily, isosorbide mononitrate 30 mg daily, losartan 100 mg daily, nifedipine 30 mg daily resumed

## 2022-01-02 NOTE — Consult Note (Signed)
Pharmacy Antibiotic Note  Adam Howell is a 61 y.o. male admitted on 01/02/2022 with  wound infection .  Pharmacy has been consulted for vancomycin dosing.  Assessment: 61 yo M with PMH ESRD-HD (MWF), DM, HTN, CHF presents with right leg ulcer on shin, patient unable to recall any trauma that would have caused it. Patient has been taking doxycycline 100 mg BID for it for 2 days but pain became too great and he came to hospital. Wound described as purulent with a foul odor. Leg CT identifies fairly extensive arterial calcifications in soft tissues. Ceftriaxone 2 g x 1 and Vancomycin 1000 mg x 1 given in the ED.  Plan: Initiate vancomycin 1000 mg IV with every HD session (MWF) Goal trough 15-25 mcg/mL Follow up culture results to assess for opportunities to narrow therapy  Continue ceftriaxone 2 g IV q24H as part of the above plan.  Height: 6\' 3"  (190.5 cm) Weight: 120.2 kg (265 lb) IBW/kg (Calculated) : 84.5  Temp (24hrs), Avg:98 F (36.7 C), Min:97.5 F (36.4 C), Max:98.2 F (36.8 C)  Recent Labs  Lab 01/02/22 1015 01/02/22 1138 01/02/22 1338  WBC 6.1  --   --   CREATININE 13.92*  --   --   LATICACIDVEN  --  2.0* 1.3    Estimated Creatinine Clearance: 7.9 mL/min (A) (by C-G formula based on SCr of 13.92 mg/dL (H)).    Allergies  Allergen Reactions   Tramadol Rash    Antimicrobials this admission: Ceftriaxone 10/20 >>  Vancomycin 10/20 >>   Dose adjustments this admission: N/A  Microbiology results: 10/20 BCx: collected  Thank you for allowing pharmacy to be a part of this patient's care.  Dara Hoyer, PharmD PGY-1 Pharmacy Resident 01/02/2022 4:11 PM

## 2022-01-02 NOTE — H&P (Signed)
History and Physical   Indalecio Lee Howell MRN:[id number] DOB: 08/17/1960 DOA: 01/02/2022  PCP: Cannady, Jolene T, NP  Outpatient Specialists: Dr. Shah, [facility] Patient coming from: Home  I have personally briefly reviewed patient's old medical records in [facility] EMR.  Chief Concern: Right lower extremity wound  HPI: Mr. Byrant Coatney is a 61-year-old male with hypertension, neuropathy, atrial fibrillation on Eliquis and amiodarone, insulin-dependent diabetes mellitus, insomnia, who presents to the emergency department for chief concerns of worsening right lower extremity wound.  The wound has been ongoing for approximately 1 week.  He was seen Adam outpatient PCP twice in the last week and was prescribed antibiotic which he has been compliant with.  The wound has worsened and cause severe pain causing him to miss hemodialysis session on day of admission.  Initial vitals in the emergency department showed temperature of 97.5, respiration rate of 16, heart rate of 81, blood pressure 133/82, SPO2 of 100% on room air.  Serum sodium is 135, potassium 4.7, chloride 89, bicarb 24, BUN of 58, serum creatinine of 13.92, EGFR 4, nonfasting blood glucose 76, WBC 6.1, hemoglobin 16.3, platelets of 203.  ED treatment: Dilaudid 0.5 mg IV one-time dose, ceftriaxone 2 g IV, vancomycin IV.  At bedside he is able to tell me his name, age, current location, current calendar year.  He does not appear to be in acute distress.  He reports the pain in his right lower leg is 10 out of 10.  He reports the wound started about a week ago and he is compliant with the antibiotic prescribed to him Adam his PCP.  He missed dialysis session because the pain was excruciating.  Social history: Lives at home with his daughter.  He denies EtOH, recreational drug use, tobacco.  ROS: Constitutional: no weight change, no fever ENT/Mouth: no sore throat, no rhinorrhea Eyes: no eye pain, no vision  changes Cardiovascular: no chest pain, no dyspnea,  no edema, no palpitations Respiratory: no cough, no sputum, no wheezing Gastrointestinal: no nausea, no vomiting, no diarrhea, no constipation Genitourinary: no urinary incontinence, no dysuria, no hematuria Musculoskeletal: no arthralgias, no myalgias Skin: + skin lesions, no pruritus, Neuro: no weakness, no loss of consciousness, no syncope Psych: no anxiety, no depression, no decrease appetite Heme/Lymph: no bruising, no bleeding  ED Course: Discussed with emergency medicine provider, patient requiring hospitalization for chief concerns of wound failing outpatient therapy.  Assessment/Plan  Principal Problem:   Diabetic leg ulcer (HCC) Active Problems:   End stage renal disease (HCC)   Hypertensive heart and kidney disease with heart failure and chronic kidney disease stage V (HCC)   Obesity   Secondary hyperparathyroidism of renal origin (HCC)   Type 2 diabetes mellitus with ESRD (end-stage renal disease) (HCC)   Chronic pain syndrome   Insomnia   Atrial fibrillation (HCC)   Hyperlipidemia associated with type 2 diabetes mellitus (HCC)   Skin ulcer of right lower leg, limited to breakdown of skin (HCC)   Assessment and Plan:  * Diabetic leg ulcer (HCC) - Ceftriaxone 2 g daily, vancomycin per pharmacy - Blood cultures x2 are in process - ABI was read as normal therefore vascular was not consulted Adam myself - Pain control: Norco 10-325 mg every 6 hours as needed for moderate pain, morphine 4 mg IV every 4 hours as needed for severe pain, 4 doses ordered - AM team to reassess patient at bedside to determine continued opioid pain medication requirements  Hyperlipidemia associated with type   2 diabetes mellitus (HCC) - Rosuvastatin 40 mg daily resumed  Insomnia - Trazodone 50 mg nightly as needed for sleep, zolpidem 10 mg nightly for sleep  Type 2 diabetes mellitus with ESRD (end-stage renal disease) (HCC) - Insulin SSI  with agents coverage, end-stage renal disease dosing  Hypertensive heart and kidney disease with heart failure and chronic kidney disease stage V (HCC) - Carvedilol 3.125 mg p.o. twice daily, furosemide 80 mg p.o. twice daily, isosorbide mononitrate 30 mg daily, losartan 100 mg daily, nifedipine 30 mg daily resumed  End stage renal disease (HCC) -Patient got dialysis on day of admission  Chart reviewed.   DVT prophylaxis: Eliquis Code Status: Full code Diet: Renal/carb modified Family Communication: No Disposition Plan: Pending clinical course Consults called: Nephrology Admission status: Telemetry medical,  Past Medical History:  Diagnosis Date   Chronic kidney disease    Congestive heart failure (HCC)    Diabetes mellitus without complication (HCC)    Hyperlipidemia    Hypertension    Past Surgical History:  Procedure Laterality Date   WOUND DEBRIDEMENT Left    Social History:  reports that he has never smoked. He has never used smokeless tobacco. He reports that he does not currently use alcohol. He reports that he does not use drugs.  Allergies  Allergen Reactions   Tramadol Rash   Family History  Problem Relation Age of Onset   Heart failure Mother    Cirrhosis Father    Hypertension Daughter    Family history: Family history reviewed and not pertinent  Prior to Admission medications   Medication Sig Start Date End Date Taking? Authorizing Provider  amiodarone (PACERONE) 200 MG tablet Take 1 tablet (200 mg total) Adam mouth 2 (two) times daily. 04/10/21  Yes Cannady, Jolene T, NP  bumetanide (BUMEX) 1 MG tablet Take 1 tablet (1 mg total) Adam mouth 2 (two) times daily. 04/10/21  Yes Cannady, Jolene T, NP  carvedilol (COREG) 3.125 MG tablet Take 1 tablet (3.125 mg total) Adam mouth 2 (two) times daily. 04/10/21  Yes Cannady, Jolene T, NP  collagenase (SANTYL) 250 UNIT/GM ointment Apply 1 Application topically daily. For ulceration to lower right shin. 12/24/21 01/23/22 Yes  Holdsworth, Karen, NP  ELIQUIS 2.5 MG TABS tablet Take 1 tablet (2.5 mg total) Adam mouth 2 (two) times daily. Sig: Daily as presciribed. 04/10/21  Yes Cannady, Jolene T, NP  furosemide (LASIX) 80 MG tablet Take 1 tablet (80 mg total) Adam mouth 2 (two) times daily. 04/10/21  Yes Cannady, Jolene T, NP  gabapentin (NEURONTIN) 300 MG capsule Take 1 capsule (300 mg total) Adam mouth 2 (two) times daily. 04/10/21  Yes Cannady, Jolene T, NP  HYDROcodone-acetaminophen (NORCO) 10-325 MG tablet Take 1 tablet Adam mouth every 6 (six) hours as needed. 12/26/21  Yes Cannady, Jolene T, NP  insulin aspart (NOVOLOG) 100 UNIT/ML FlexPen Inject 3 Units into the skin 3 (three) times daily with meals. As needed only if blood sugar greater then 130 prior to meal. 08/29/21  Yes Cannady, Jolene T, NP  isosorbide mononitrate (IMDUR) 30 MG 24 hr tablet Take 1 tablet (30 mg total) Adam mouth daily. 04/10/21  Yes Cannady, Jolene T, NP  losartan (COZAAR) 100 MG tablet Take 1 tablet (100 mg total) Adam mouth daily. 04/10/21  Yes Cannady, Jolene T, NP  NIFEdipine (PROCARDIA-XL/NIFEDICAL-XL) 30 MG 24 hr tablet Take 1 tablet (30 mg total) Adam mouth daily. 04/10/21  Yes Cannady, Jolene T, NP  rosuvastatin (CRESTOR) 40 MG tablet Take 1   tablet (40 mg total) Adam mouth daily. 04/10/21  Yes Cannady, Jolene T, NP  sertraline (ZOLOFT) 50 MG tablet Take 50 mg Adam mouth daily. 10/23/20  Yes [provider]  sevelamer carbonate (RENVELA) 800 MG tablet Take 2 tablets (1,600 mg total) Adam mouth 3 (three) times daily. 04/10/21  Yes Cannady, Jolene T, NP  tiZANidine (ZANAFLEX) 2 MG tablet Take 1 tablet (2 mg total) Adam mouth daily as needed for muscle spasms. 01/01/22  Yes Cannady, Jolene T, NP  traZODone (DESYREL) 50 MG tablet Take 1 tablet (50 mg total) Adam mouth at bedtime as needed for sleep. 04/10/21  Yes Cannady, Jolene T, NP  zolpidem (AMBIEN) 10 MG tablet Take 1 tablet (10 mg total) Adam mouth at bedtime as needed. 10/22/21  Yes Cannady, Jolene T, NP  Blood  Glucose Monitoring Suppl (ONETOUCH VERIO) w/Device KIT Use to check blood sugar 3 times a day and document results, bring to appointments.  Goal is <130 fasting blood sugar and <180 two hours after meals. 04/10/21   Cannady, Jolene T, NP  Blood Pressure Monitoring (BLOOD PRESSURE MONITOR AUTOMAT) DEVI Use to check blood pressure twice daily. 11/14/19   Cannady, Jolene T, NP  doxycycline (VIBRA-TABS) 100 MG tablet Take 1 tablet (100 mg total) Adam mouth 2 (two) times daily for 10 days. 12/30/21 01/09/22  Cannady, Jolene T, NP  glucose blood (ONETOUCH VERIO) test strip Use to check blood sugar 4 times a day. 04/10/21   Cannady, Jolene T, NP  insulin glargine (LANTUS SOLOSTAR) 100 UNIT/ML Solostar Pen Inject 20 Units into the skin daily. Patient not taking: Reported on 01/02/2022 08/29/21   Cannady, Jolene T, NP  Insulin Pen Needle (NOVOFINE) 30G X 8 MM MISC Use to inject insulin daily 08/28/21   Cannady, Jolene T, NP  Lancets (ONETOUCH ULTRASOFT) lancets Use to check blood sugar 4 times a day. 04/10/21   Cannady, Jolene T, NP   Physical Exam: Vitals:   01/02/22 1900 01/02/22 1915 01/02/22 1928 01/02/22 1930  BP: (!) 152/85 135/75 138/72 134/81  Pulse: 77 94 93 94  Resp: 15 18 17 18  Temp:   98 F (36.7 C)   TempSrc:   Oral   SpO2: 100% 99% 99% 96%  Weight:      Height:       Constitutional: appears older than chronological age, frail, chronically ill, NAD, calm, comfortable Eyes: PERRL, lids and conjunctivae normal ENMT: Mucous membranes are moist. Posterior pharynx clear of any exudate or lesions. Age-appropriate dentition. Hearing appropriate Neck: normal, supple, no masses, no thyromegaly Respiratory: clear to auscultation bilaterally, no wheezing, no crackles. Normal respiratory effort. No accessory muscle use.  Cardiovascular: Regular rate and rhythm, no murmurs / rubs / gallops. No extremity edema. 2+ pedal pulses. No carotid bruits.  Abdomen: no tenderness, no masses palpated, no  hepatosplenomegaly. Bowel sounds positive.  Musculoskeletal: no clubbing / cyanosis. No joint deformity upper and lower extremities. Good ROM, no contractures, no atrophy. Normal muscle tone.  Skin: + ulcers. No induration Neurologic: Sensation intact. Strength 5/5 in all 4.  Psychiatric: Normal judgment and insight. Alert and oriented x 3. Normal mood.   EKG: Not indicated on admission  x-ray on Admission: I personally reviewed and I agree with radiologist reading as below.  US ARTERIAL ABI (SCREENING LOWER EXTREMITY)  Result Date: 01/02/2022 CLINICAL DATA:  Limb ischemia, diabetic foot ulcer EXAM: NONINVASIVE PHYSIOLOGIC VASCULAR STUDY OF BILATERAL LOWER EXTREMITIES TECHNIQUE: Evaluation of both lower extremities were performed at rest, including calculation   of ankle-brachial indices with single level Doppler, pressure and pulse volume recording. COMPARISON:  None Available. FINDINGS: Right ABI:  1.02 Left ABI:  0.93 Right Lower Extremity:  Normal arterial waveforms at the ankle. Left Lower Extremity:  Normal arterial waveforms at the ankle. 1.0-1.4 Normal IMPRESSION: Normal resting ABIs Electronically Signed   Adam: M.  Shick M.D.   On: 01/02/2022 15:08   DG Tibia/Fibula Right  Result Date: 01/02/2022 CLINICAL DATA:  Nonhealing ulcer EXAM: RIGHT TIBIA AND FIBULA - 2 VIEW COMPARISON:  None Available. FINDINGS: No fracture or dislocation is seen. There are no focal lytic lesions. There is no effusion in the right knee. Fairly extensive arterial calcifications are seen in soft tissues. There is edema in subcutaneous plane. Plantar spur is seen in calcaneus. IMPRESSION: No fracture or dislocation is seen. There are no focal lytic lesions. Electronically Signed   Adam: Palani  Rathinasamy M.D.   On: 01/02/2022 13:00    Labs on Admission: I have personally reviewed following labs  CBC: Recent Labs  Lab 01/02/22 1015  WBC 6.1  NEUTROABS 4.3  HGB 16.3  HCT 49.7  MCV 87.7  PLT 203   Basic  Metabolic Panel: Recent Labs  Lab 01/02/22 1015  NA 135  K 4.7  CL 89*  CO2 24  GLUCOSE 76  BUN 58*  CREATININE 13.92*  CALCIUM 8.2*   GFR: Estimated Creatinine Clearance: 7.7 mL/min (A) (Adam C-G formula based on SCr of 13.92 mg/dL (H)).  Dr. [facility]  If 7PM-7AM, please contact overnight-coverage provider If 7AM-7PM, please contact day coverage provider [email or web address]  01/02/2022, 7:47 PM   

## 2022-01-02 NOTE — Hospital Course (Signed)
[name] is a 61 year old male with hypertension, neuropathy, atrial fibrillation on Eliquis and amiodarone, insulin-dependent diabetes mellitus, insomnia, who presents to the emergency department for chief concerns of worsening right lower extremity wound.  The wound has been ongoing for approximately 1 week.  He was seen by outpatient PCP twice in the last week and was prescribed antibiotic which he has been compliant with.  The wound has worsened and cause severe pain causing him to miss hemodialysis session on day of admission.  Initial vitals in the emergency department showed temperature of 97.5, respiration rate of 16, heart rate of 81, blood pressure 133/82, SPO2 of 100% on room air.  Serum sodium is 135, potassium 4.7, chloride 89, bicarb 24, BUN of 58, serum creatinine of 13.92, EGFR 4, nonfasting blood glucose 76, WBC 6.1, hemoglobin 16.3, platelets of 203.  ED treatment: Dilaudid 0.5 mg IV one-time dose, ceftriaxone 2 g IV, vancomycin IV.

## 2022-01-02 NOTE — Assessment & Plan Note (Signed)
-   Rosuvastatin 40 mg daily resumed 

## 2022-01-02 NOTE — Consult Note (Signed)
Adam Howell MRN: [id number] DOB/AGE: [birth year] 61 y.o. Primary Care Physician:Cannady, Barbaraann Faster, NP Admit date: 01/02/2022 Chief Complaint:  Chief Complaint  Patient presents with   Wound Check   HPI:  Patient is a 61 year old African-American male with a past medical history of ESRD, diabetes mellitus, hypertension, CHF who came to the ER with chief complaint of leg pain/need to get his wound checked out.   History of present illness date back to a week ago when patient noticed wound on his right leg.  Patient did go to his primary care he was started on antibiotics but patient pain was getting progressively worse so he decided to come to the ER. Patient is on Monday Wednesday Friday schedule for his dialysis but patient was not able to go to his regular chronic dialysis unit treatment because of his unbearable pain. Nephrology was consulted for comanagement of dialysis patient Patient was seen In the ER.  Patient offers no Complaint of shortness of breath No complaint of fever No complaint of chills No complaint of change in speech or change in vision No no complaint of any trauma   Past Medical History:  Diagnosis Date   Chronic kidney disease    Congestive heart failure (HCC)    Diabetes mellitus without complication ([place])    Hyperlipidemia    Hypertension         Family History  Problem Relation Age of Onset   Heart failure Mother    Cirrhosis Father    Hypertension Daughter     Social History:  reports that he has never smoked. He has never used smokeless tobacco. He reports that he does not currently use alcohol. He reports that he does not use drugs.   Allergies:  Allergies  Allergen Reactions   Tramadol Rash    (Not in a hospital admission)      PZW:CHENI from the symptoms mentioned above,there are no other symptoms referable to all systems reviewed.   amiodarone  200 mg Oral BID   [START ON 01/03/2022] Chlorhexidine Gluconate Cloth  6 each  Topical Q0600   heparin  5,000 Units Subcutaneous Q8H   pentafluoroprop-tetrafluoroeth             DPO:EUMPN from the symptoms mentioned above,there are no other symptoms referable to all systems reviewed.  Physical Exam: Vital signs in last 24 hours: Temp:  [97.5 F (36.4 C)-98.2 F (36.8 C)] 98.2 F (36.8 C) (10/20 1541) Pulse Rate:  [79-94] 94 (10/20 1615) Resp:  [15-18] 18 (10/20 1615) BP: (105-146)/(65-90) 105/65 (10/20 1615) SpO2:  [98 %-100 %] 98 % (10/20 1615) Weight:  [115.5 kg-120.2 kg] 115.5 kg (10/20 1603) Weight change:     Intake/Output from previous day: No intake/output data recorded. Total I/O In: 100 [IV Piggyback:100] Out: -    Physical Exam: General- pt is awake,alert, oriented to time place and person  Resp- No acute REsp distress, CTA B/L NO Rhonchi  CVS- S1S2 regular ij rate and rhythm  GIT- BS+, soft, NT, ND  EXT- No LE Edema,  No Cyanosis          3 cm wound on the right tibia in the ventral area, necrotic  CNS- CN 2-12 grossly intact. Moving all 4 extremities  Psych- normal mood and affect  Access- AVF   Lab Results: CBC Recent Labs    01/02/22 1015  WBC 6.1  HGB 16.3  HCT 49.7  PLT 203    BMET Recent Labs    01/02/22 1015  NA 135  K 4.7  CL 89*  CO2 24  GLUCOSE 76  BUN 58*  CREATININE 13.92*  CALCIUM 8.2*    MICRO No results found for this or any previous visit (from the past 240 hour(s)).    Lab Results  Component Value Date   CALCIUM 8.2 (L) 01/02/2022     Impression:     1)Renal     End-stage renal disease Patient is on hemodialysis Patient is on Monday Wednesday Friday schedule Patient undergoes dialysis at [facility] [place] Patient is under the care of [facility]-Dr. Smith Mince We will dialyze patient today   2)HTN   Blood pressure is stable      3)Anemia of chronic disease       Latest Ref Rng & Units 01/02/2022   10:15 AM 04/13/2021   12:50 PM 04/10/2021    8:23 AM  CBC  WBC 4.0 -  10.5 K/uL 6.1  5.9  5.6   Hemoglobin 13.0 - 17.0 g/dL 16.3  10.4  11.6   Hematocrit 39.0 - 52.0 % 49.7  34.2  35.1   Platelets 150 - 400 K/uL 203  116  130          HGb at goal (9--11)     4) Secondary hyperparathyroidism -CKD Mineral-Bone Disorder      Patient has history of secondary hyperparathyroidism As an outpatient patient had intact PTH of 312 on October 2 Patient is on p.o. calcitriol 1.25 mcg and Sensipar of 60 mg during treatment       5)Right leg wound Primary team is following     6) Electrolytes        Latest Ref Rng & Units 01/02/2022   10:15 AM 08/25/2021    1:58 PM 04/13/2021   12:50 PM  BMP  Glucose 70 - 99 mg/dL 76  112  140   BUN 6 - 20 mg/dL 58  22  48   Creatinine 0.61 - 1.24 mg/dL 13.92  8.12  8.67   BUN/Creat Ratio 10 - 24   3     Sodium 135 - 145 mmol/L 135  136  140   Potassium 3.5 - 5.1 mmol/L 4.7  5.3  4.3   Chloride 98 - 111 mmol/L 89  86  97   CO2 22 - 32 mmol/L 24  21  31    Calcium 8.9 - 10.3 mg/dL 8.2  9.3  8.4       Sodium Normonatremic     Potassium Normokalemic       7)Acid base   Co2 at goal     8)Diabetes mellitus type 2 Patient is being followed by primary team   Plan:   We will dialyze patient today       Addendum Patient was seen again on dialysis. Patient tolerating treatment well      Averill Pons s Theador Hawthorne 01/02/2022, 4:21 PM

## 2022-01-02 NOTE — Assessment & Plan Note (Addendum)
--  BG have been within inpatient goal. --d/c BG checks and SSI

## 2022-01-02 NOTE — ED Provider Notes (Signed)
[facility] Provider Note    Event Date/Time   First MD Initiated Contact with Patient 01/02/22 1107     (approximate)   History   Wound Check   HPI  Kairen Hallinan is a 61 y.o. male  with pmh ckd, DM, HTN, CHF who presents with leg ulcer.  Patient noticed a wound on his right shin about 1 week ago denies any preceding trauma.  He has seen his PCP twice was started on antibiotics just 2 days ago he is taken about a day.  Was post to go to dialysis today but the pain was too severe he went to dialysis but was not able to get a treatment.  Denies any fevers or chills.  Did have history of similar wound on the left calf that resolved on its own.  Denies any pain in his legs prior to the ulcer.  Patient's been referred to vascular as an outpatient but does not yet have an appointment.   Past Medical History:  Diagnosis Date   Chronic kidney disease    Congestive heart failure ([place])    Diabetes mellitus without complication ([place])    Hyperlipidemia    Hypertension     Patient Active Problem List   Diagnosis Date Noted   Controlled substance agreement signed 04/10/2021   Skin ulcer of right lower leg, limited to breakdown of skin ([place]) 02/02/2020   Other thrombophilia ([place]) 01/19/2020   Atrial fibrillation ([place]) 11/11/2019   Hyperlipidemia associated with type 2 diabetes mellitus ([place]) 11/11/2019   Erectile dysfunction 11/11/2019   Heart failure with reduced ejection fraction ([place]) 10/26/2019   Type 2 diabetes mellitus with ESRD (end-stage renal disease) ([place]) 10/04/2019   Chronic pain syndrome 10/04/2019   Insomnia 10/04/2019   Atherosclerotic heart disease of native coronary artery without angina pectoris 09/01/2019   Hypertensive heart and kidney disease with heart failure and chronic kidney disease stage V ([place]) 09/01/2019   Obesity 09/01/2019   Anemia in chronic kidney disease 07/25/2019   End stage renal disease ([place]) 07/25/2019   Secondary  hyperparathyroidism of renal origin ([place]) 07/25/2019     Physical Exam  Triage Vital Signs: ED Triage Vitals  Enc Vitals Group     BP 01/02/22 1009 133/82     Pulse Rate 01/02/22 1009 81     Resp 01/02/22 1009 16     Temp 01/02/22 1009 (!) 97.5 F (36.4 C)     Temp Source 01/02/22 1009 Oral     SpO2 01/02/22 1011 100 %     Weight 01/02/22 1009 265 lb (120.2 kg)     Height 01/02/22 1009 6\' 3"  (1.905 m)     Head Circumference --      Peak Flow --      Pain Score 01/02/22 1009 10     Pain Loc --      Pain Edu? --      Excl. in [place]? --     Most recent vital signs: Vitals:   01/02/22 1009 01/02/22 1011  BP: 133/82   Pulse: 81   Resp: 16   Temp: (!) 97.5 F (36.4 C)   SpO2:  100%     General: Awake, no distress.  CV:  Good peripheral perfusion.  Resp:  Normal effort.  Abd:  No distention.  Neuro:             Awake, Alert, Oriented x 3  Other:  Deep ulceration on the right shin with purulent  drainage foul odor mild tenderness to palpation no crepitus compartments soft No palpable DP or PT pulses bilaterally, there is a biphasic dopplerable PT signal on the right and left no dopplerable DP signal bilaterally   ED Results / Procedures / Treatments  Labs (all labs ordered are listed, but only abnormal results are displayed) Labs Reviewed  CBC WITH DIFFERENTIAL/PLATELET - Abnormal; Notable for the following components:      Result Value   RDW 15.9 (*)    All other components within normal limits  BASIC METABOLIC PANEL - Abnormal; Notable for the following components:   Chloride 89 (*)    BUN 58 (*)    Creatinine, Ser 13.92 (*)    Calcium 8.2 (*)    GFR, Estimated 4 (*)    Anion gap 22 (*)    All other components within normal limits  CULTURE, BLOOD (ROUTINE X 2)  CULTURE, BLOOD (ROUTINE X 2)  LACTIC ACID, PLASMA  LACTIC ACID, PLASMA     EKG     RADIOLOGY    PROCEDURES:  Critical Care performed: No  Procedures   MEDICATIONS ORDERED IN  ED: Medications  vancomycin (VANCOREADY) IVPB 2000 mg/400 mL (has no administration in time range)  cefTRIAXone (ROCEPHIN) 2 g in sodium chloride 0.9 % 100 mL IVPB (2 g Intravenous New Bag/Given 01/02/22 1253)     IMPRESSION / MDM / ASSESSMENT AND PLAN / ED COURSE  I reviewed the triage vital signs and the nursing notes.                              Patient's presentation is most consistent with acute presentation with potential threat to life or bodily function.  Differential diagnosis includes, but is not limited to, venous stasis ulcer, chronic limb ischemia, osteomyelitis, cellulitis, abscess  Patient is a 51-year-old male history of diabetes end-stage renal disease presenting with a nonhealing ulceration on the right shin has been present for about 1 week.  Recently started on antibiotics by his PCP has been referred to vascular.  Could not get dialysis today because of increasing pain.  Exam he does have a rather deep appearing purulent ulceration on the right shin that has foul odor.  Signs of chronic limb ischemia including cool extremities bilaterally with minimal hair.  He does have biphasic dopplerable signals in the PT bilaterally but no palpable DP signal and no palpable pulses.  Given the progression of the wound over the last week despite outpatient care I think he will likely need admission.  Was also not able to get dialysis today.  Will defer CTA at this time given his end-stage renal disease will likely benefit from vascular consult.  We will get an x-ray of the tib-fib to assess for signs of osteomyelitis but may need MRI.  Will cover with ceftriaxone vancomycin.  Patient does have an anion gap could be related to his uremia we will check lactate.       FINAL CLINICAL IMPRESSION(S) / ED DIAGNOSES   Final diagnoses:  Ulcer of right lower extremity, unspecified ulcer stage ([place])     Rx / DC Orders   ED Discharge Orders     None        Note:  This document was  prepared using Dragon voice recognition software and may include unintentional dictation errors.   Rada Hay, MD 01/02/22 [phone]

## 2022-01-02 NOTE — Assessment & Plan Note (Addendum)
-  Patient got dialysis on day of admission

## 2022-01-02 NOTE — Assessment & Plan Note (Signed)
--  Full thickness wound with black eschar over the top, painful.  This could be from infection, trauma or calciphylaxis. --started on vanc/ceftriaxone on admission Plan: --wound care RN consult --cont ceftriaxone for now --oral pain meds

## 2022-01-03 DIAGNOSIS — L97911 Non-pressure chronic ulcer of unspecified part of right lower leg limited to breakdown of skin: Secondary | ICD-10-CM

## 2022-01-03 LAB — BASIC METABOLIC PANEL
Anion gap: 16 — ABNORMAL HIGH (ref 5–15)
BUN: 37 mg/dL — ABNORMAL HIGH (ref 6–20)
CO2: 26 mmol/L (ref 22–32)
Calcium: 7.9 mg/dL — ABNORMAL LOW (ref 8.9–10.3)
Chloride: 93 mmol/L — ABNORMAL LOW (ref 98–111)
Creatinine, Ser: 10.2 mg/dL — ABNORMAL HIGH (ref 0.61–1.24)
GFR, Estimated: 5 mL/min — ABNORMAL LOW (ref >60)
Glucose, Bld: 84 mg/dL (ref 70–99)
Potassium: 3.7 mmol/L (ref 3.5–5.1)
Sodium: 135 mmol/L (ref 135–145)

## 2022-01-03 LAB — CBC
HCT: 46 % (ref 39.0–52.0)
Hemoglobin: 15.2 g/dL (ref 13.0–17.0)
MCH: 28.7 pg (ref 26.0–34.0)
MCHC: 33 g/dL (ref 30.0–36.0)
MCV: 87 fL (ref 80.0–100.0)
Platelets: 191 10*3/uL (ref 150–400)
RBC: 5.29 MIL/uL (ref 4.22–5.81)
RDW: 15.4 % (ref 11.5–15.5)
WBC: 6.3 10*3/uL (ref 4.0–10.5)
nRBC: 0 % (ref 0.0–0.2)

## 2022-01-03 LAB — GLUCOSE, CAPILLARY
Glucose-Capillary: 105 mg/dL — ABNORMAL HIGH (ref 70–99)
Glucose-Capillary: 128 mg/dL — ABNORMAL HIGH (ref 70–99)
Glucose-Capillary: 120 mg/dL — ABNORMAL HIGH (ref 70–99)
Glucose-Capillary: 124 mg/dL — ABNORMAL HIGH (ref 70–99)

## 2022-01-03 MED ORDER — HYDROCODONE-ACETAMINOPHEN 10-325 MG PO TABS
1.0000 | ORAL_TABLET | ORAL | Status: DC | PRN
  Administered 2022-01-03 – 2022-01-04 (×2): 2 via ORAL
  Administered 2022-01-04: 1 via ORAL
  Administered 2022-01-04 – 2022-01-05 (×3): 2 via ORAL
  Administered 2022-01-05 – 2022-01-06 (×3): 1 via ORAL
  Filled 2022-01-03 (×2): qty 2
  Filled 2022-01-03 (×2): qty 1
  Filled 2022-01-03 (×3): qty 2
  Filled 2022-01-03: qty 1
  Filled 2022-01-03: qty 2
  Filled 2022-01-03: qty 1

## 2022-01-03 MED ORDER — SILVER SULFADIAZINE 1 % EX CREA
TOPICAL_CREAM | Freq: Every day | CUTANEOUS | Status: DC
  Filled 2022-01-03: qty 85

## 2022-01-03 NOTE — Assessment & Plan Note (Signed)
BMI 31.52

## 2022-01-03 NOTE — Assessment & Plan Note (Signed)
--  cont amiodarone, coreg --cont eliquis

## 2022-01-03 NOTE — Progress Notes (Signed)
[facility]  PROGRESS NOTE   Subjective:   Patient seen at bedside.  Comfortable.  Objective:  Vital signs: Blood pressure 123/68, pulse 87, temperature 97.8 F (36.6 C), temperature source Oral, resp. rate 18, height 6\' 3"  (1.905 m), weight 114.4 kg, SpO2 97 %.  Intake/Output Summary (Last 24 hours) at 01/03/2022 1643 Last data filed at 01/03/2022 0300 Gross per 24 hour  Intake 553.5 ml  Output 1500 ml  Net -946.5 ml   Filed Weights   01/02/22 1009 01/02/22 1603 01/02/22 1951  Weight: 120.2 kg 115.5 kg 114.4 kg     Physical Exam: General:  No acute distress  Head:  Normocephalic, atraumatic. Moist oral mucosal membranes  Eyes:  Anicteric  Neck:  Supple  Lungs:   Clear to auscultation, normal effort  Heart:  S1S2 no rubs  Abdomen:   Soft, nontender, bowel sounds present  Extremities:  peripheral edema.  Neurologic:  Awake, alert, following commands  Skin:  No lesions  Access:     Basic Metabolic Panel: Recent Labs  Lab 01/02/22 1015 01/03/22 0503  NA 135 135  K 4.7 3.7  CL 89* 93*  CO2 24 26  GLUCOSE 76 84  BUN 58* 37*  CREATININE 13.92* 10.20*  CALCIUM 8.2* 7.9*    CBC: Recent Labs  Lab 01/02/22 1015 01/03/22 0503  WBC 6.1 6.3  NEUTROABS 4.3  --   HGB 16.3 15.2  HCT 49.7 46.0  MCV 87.7 87.0  PLT 203 191     Urinalysis: No results for input(s): "COLORURINE", "LABSPEC", "PHURINE", "GLUCOSEU", "HGBUR", "BILIRUBINUR", "KETONESUR", "PROTEINUR", "UROBILINOGEN", "NITRITE", "LEUKOCYTESUR" in the last 72 hours.  Invalid input(s): "APPERANCEUR"    Imaging: US ARTERIAL ABI (SCREENING LOWER EXTREMITY)  Result Date: 01/02/2022 CLINICAL DATA:  Limb ischemia, diabetic foot ulcer EXAM: NONINVASIVE PHYSIOLOGIC VASCULAR STUDY OF BILATERAL LOWER EXTREMITIES TECHNIQUE: Evaluation of both lower extremities were performed at rest, including calculation of ankle-brachial indices with single level Doppler, pressure and pulse volume recording.  COMPARISON:  None Available. FINDINGS: Right ABI:  1.02 Left ABI:  0.93 Right Lower Extremity:  Normal arterial waveforms at the ankle. Left Lower Extremity:  Normal arterial waveforms at the ankle. 1.0-1.4 Normal IMPRESSION: Normal resting ABIs Electronically Signed   By: Jerilynn Mages.  Shick M.D.   On: 01/02/2022 15:08   DG Tibia/Fibula Right  Result Date: 01/02/2022 CLINICAL DATA:  Nonhealing ulcer EXAM: RIGHT TIBIA AND FIBULA - 2 VIEW COMPARISON:  None Available. FINDINGS: No fracture or dislocation is seen. There are no focal lytic lesions. There is no effusion in the right knee. Fairly extensive arterial calcifications are seen in soft tissues. There is edema in subcutaneous plane. Plantar spur is seen in calcaneus. IMPRESSION: No fracture or dislocation is seen. There are no focal lytic lesions. Electronically Signed   By: Elmer Picker M.D.   On: 01/02/2022 13:00     Medications:    anticoagulant sodium citrate     cefTRIAXone (ROCEPHIN)  IV 2 g (01/03/22 0844)   [START ON 01/05/2022] vancomycin      amiodarone  200 mg Oral BID   apixaban  2.5 mg Oral BID   bumetanide  1 mg Oral BID   carvedilol  3.125 mg Oral BID   Chlorhexidine Gluconate Cloth  6 each Topical Q0600   furosemide  80 mg Oral BID   gabapentin  300 mg Oral BID   insulin aspart  0-5 Units Subcutaneous QHS   insulin aspart  0-6 Units Subcutaneous TID WC  isosorbide mononitrate  30 mg Oral Daily   losartan  100 mg Oral Daily   NIFEdipine  30 mg Oral Daily   rosuvastatin  40 mg Oral Daily   sertraline  50 mg Oral Daily   sevelamer carbonate  1,600 mg Oral TID WC   silver sulfADIAZINE   Topical Daily    Assessment/ Plan:     Principal Problem:   Diabetic leg ulcer (HCC) Active Problems:   End stage renal disease (HCC)   Hypertensive heart and kidney disease with heart failure and chronic kidney disease stage V (HCC)   Obesity   Secondary hyperparathyroidism of renal origin ([place])   Type 2 diabetes mellitus with  ESRD (end-stage renal disease) (HCC)   Chronic pain syndrome   Insomnia   Atrial fibrillation (HCC)   Hyperlipidemia associated with type 2 diabetes mellitus (HCC)   Skin ulcer of right lower leg, limited to breakdown of skin ([place])  61 year old male with hypertension, neuropathy, atrial fibrillation on Eliquis and amiodarone, insulin-dependent diabetes mellitus, insomnia, who presents to the emergency department for chief concerns of worsening right lower extremity wound.  #1: ESRD: Patient had a stable dialysis treatment on Friday.  We will continue to maintain on Monday Wednesday Friday schedule.  #2: Second hyperparathyroidism: We will check PTH, calcium and phosphorus levels.  We will continue the sevelamer at the present doses.  #3: Hypertension: Continue current antihypertensive medications with nifedipine, losartan, isosorbide and carvedilol.  We will continue to monitor closely.   LOS: [place], MD [facility] 10/21/20234:43 PM

## 2022-01-03 NOTE — Assessment & Plan Note (Signed)
--  Full thickness wound with black eschar over the top, painful.  This could be from infection, trauma or calciphylaxis. --started on vanc/ceftriaxone on admission Plan: --wound care RN consult --cont ceftriaxone for now --oral pain meds

## 2022-01-03 NOTE — Consult Note (Signed)
WOC Nurse Consult Note: Reason for Consult:Right LE pretibial full thickness wound Wound type: infectious vs trauma vs calciphylaxis Pressure Injury POA: N/A Measurement:3.4cm x 2.8 with depth unable to be determined due to the presence of nonviable tissue Wound bed:See above Drainage (amount, consistency, odor) small light yellow Periwound: intact Dressing procedure/placement/frequency: I have discussed the differential diagnoses of this wound with Dr Billie Ruddy and we have together determined a preliminary POC using silver sulfadiazine cream applied once daily and topped with saline moistened gauze and secured with an ABD pad for comfort and Kerlix roll gauze/paper tape. Pressure injury prevention interventions are provided for Nursing and include but are not limited to turning and repositioning to minimize time in the supine position, a pressure redistribution chair cushion for when he is OOB to the chair, placement of a sacral foam prophylactic dressing and floatation of heels.  Recommend consultation with Nephrology/ID/Dermatology as desired for a definitive diagnosis and POC. If you agree, please order/arrange.  [place] nursing team will not follow, but will remain available to this patient, the nursing and medical teams.  Please re-consult if needed.  Thank you for inviting [place] to participate in this patient's Plan of Care.  Maudie Flakes, MSN, RN, CNS, [place], Serita Grammes, [employer], [employer] phone:  [phone]

## 2022-01-03 NOTE — Progress Notes (Signed)
  PROGRESS NOTE    Adam Howell  FMB:[id number] DOB: 11-19-1960 DOA: 01/02/2022 PCP: Venita Lick, NP  215A/215A-AA  LOS: 1 day   Brief hospital course:   Assessment & Plan: Adam Howell is a 61 year old male with hypertension, atrial fibrillation on Eliquis, diabetes mellitus, ESRD on HD, who presented to the emergency department for chief concerns of worsening right lower extremity wound.   * Skin ulcer of right lower leg, limited to breakdown of skin ([place]) --Full thickness wound with black eschar over the top, painful.  This could be from infection, trauma or calciphylaxis. --started on vanc/ceftriaxone on admission Plan: --wound care RN consult --cont ceftriaxone for now --oral pain meds  Hyperlipidemia associated with type 2 diabetes mellitus (HCC) - Rosuvastatin 40 mg daily resumed  Atrial fibrillation (HCC) --cont amiodarone, coreg --cont eliquis  Insomnia - Trazodone 50 mg nightly as needed for sleep, zolpidem 10 mg nightly for sleep  Type 2 diabetes mellitus with ESRD (end-stage renal disease) (HCC) --BG have been within inpatient goal. --d/c BG checks and SSI  Obesity BMI 31.52  Hypertensive heart and kidney disease with heart failure and chronic kidney disease stage V (HCC) - Carvedilol 3.125 mg p.o. twice daily, furosemide 80 mg p.o. twice daily, isosorbide mononitrate 30 mg daily, losartan 100 mg daily, nifedipine 30 mg daily resumed  End stage renal disease ([place]) --iHD per nephro   DVT prophylaxis: [id number] Code Status: Full code  Family Communication:  Level of care: Telemetry Medical Dispo:   The patient is from: home Anticipated d/c is to: home Anticipated d/c date is: 1-2 days Patient currently is not medically ready to d/c due to: IV abx   Subjective and Interval History:  Pt's only complaint was pain in his RLE wound.     Objective: Vitals:   01/02/22 1951 01/02/22 2232 01/03/22 0500 01/03/22 0751  BP:  (!) 159/83 (!)  144/82 123/68  Pulse:  78 98 87  Resp:  20 20 18   Temp:  98.8 F (37.1 C) 97.8 F (36.6 C) 97.8 F (36.6 C)  TempSrc:   Oral Oral  SpO2:  100% 98% 97%  Weight: 114.4 kg     Height:        Intake/Output Summary (Last 24 hours) at 01/03/2022 1958 Last data filed at 01/03/2022 1922 Gross per 24 hour  Intake 793.5 ml  Output --  Net 793.5 ml   Filed Weights   01/02/22 1009 01/02/22 1603 01/02/22 1951  Weight: 120.2 kg 115.5 kg 114.4 kg    Examination:   Constitutional: NAD, AAOx3 HEENT: conjunctivae and lids normal, EOMI CV: No cyanosis.   RESP: normal respiratory effort, on RA SKIN: warm, dry.  Full thickness wound with black eschar over the top, tender to palpation around the edge of the wound Neuro: II - XII grossly intact.   Psych: Normal mood and affect.  Appropriate judgement and reason      Data Reviewed: I have personally reviewed labs and imaging studies  Time spent: 50 minutes  Enzo Bi, MD [facility] If 7PM-7AM, please contact night-coverage 01/03/2022, 7:58 PM

## 2022-01-04 DIAGNOSIS — L97911 Non-pressure chronic ulcer of unspecified part of right lower leg limited to breakdown of skin: Secondary | ICD-10-CM | POA: Diagnosis not present

## 2022-01-04 LAB — BASIC METABOLIC PANEL
Anion gap: 16 — ABNORMAL HIGH (ref 5–15)
BUN: 48 mg/dL — ABNORMAL HIGH (ref 6–20)
CO2: 25 mmol/L (ref 22–32)
Calcium: 7.9 mg/dL — ABNORMAL LOW (ref 8.9–10.3)
Chloride: 93 mmol/L — ABNORMAL LOW (ref 98–111)
Creatinine, Ser: 12.31 mg/dL — ABNORMAL HIGH (ref 0.61–1.24)
GFR, Estimated: 4 mL/min — ABNORMAL LOW (ref >60)
Glucose, Bld: 93 mg/dL (ref 70–99)
Potassium: 4.1 mmol/L (ref 3.5–5.1)
Sodium: 134 mmol/L — ABNORMAL LOW (ref 135–145)

## 2022-01-04 LAB — GLUCOSE, CAPILLARY
Glucose-Capillary: 102 mg/dL — ABNORMAL HIGH (ref 70–99)
Glucose-Capillary: 111 mg/dL — ABNORMAL HIGH (ref 70–99)
Glucose-Capillary: 111 mg/dL — ABNORMAL HIGH (ref 70–99)

## 2022-01-04 LAB — CBC
HCT: 43.9 % (ref 39.0–52.0)
Hemoglobin: 14.6 g/dL (ref 13.0–17.0)
MCH: 29.1 pg (ref 26.0–34.0)
MCHC: 33.3 g/dL (ref 30.0–36.0)
MCV: 87.5 fL (ref 80.0–100.0)
Platelets: 197 10*3/uL (ref 150–400)
RBC: 5.02 MIL/uL (ref 4.22–5.81)
RDW: 15.5 % (ref 11.5–15.5)
WBC: 6.5 10*3/uL (ref 4.0–10.5)
nRBC: 0 % (ref 0.0–0.2)

## 2022-01-04 LAB — HEPATITIS B SURFACE ANTIBODY, QUANTITATIVE: Hep B S AB Quant (Post): 326.5 m[IU]/mL (ref >9.9)

## 2022-01-04 LAB — MAGNESIUM: Magnesium: 2.3 mg/dL (ref 1.7–2.4)

## 2022-01-04 MED ORDER — INSULIN ASPART 100 UNIT/ML IJ SOLN
0.0000 [IU] | Freq: Three times a day (TID) | INTRAMUSCULAR | Status: DC
  Administered 2022-01-05 – 2022-01-06 (×2): 1 [IU] via SUBCUTANEOUS
  Filled 2022-01-04 (×2): qty 1

## 2022-01-04 MED ORDER — INSULIN ASPART 100 UNIT/ML IJ SOLN: 0.0000 [IU] | Freq: Every day | INTRAMUSCULAR | Status: DC

## 2022-01-04 NOTE — Progress Notes (Signed)
[facility]  PROGRESS NOTE   Subjective:   Feels much better.  Objective:  Vital signs: Blood pressure (!) 109/59, pulse 77, temperature 99.2 F (37.3 C), temperature source Oral, resp. rate 18, height 6\' 3"  (1.905 m), weight 114.4 kg, SpO2 98 %.  Intake/Output Summary (Last 24 hours) at 01/04/2022 1114 Last data filed at 01/04/2022 0600 Gross per 24 hour  Intake 600 ml  Output --  Net 600 ml   Filed Weights   01/02/22 1009 01/02/22 1603 01/02/22 1951  Weight: 120.2 kg 115.5 kg 114.4 kg     Physical Exam: General:  No acute distress  Head:  Normocephalic, atraumatic. Moist oral mucosal membranes  Eyes:  Anicteric  Neck:  Supple  Lungs:   Clear to auscultation, normal effort  Heart:  S1S2 no rubs  Abdomen:   Soft, nontender, bowel sounds present  Extremities:  peripheral edema.  Neurologic:  Awake, alert, following commands  Skin:  No lesions  Access:     Basic Metabolic Panel: Recent Labs  Lab 01/02/22 1015 01/03/22 0503 01/04/22 0447  NA 135 135 134*  K 4.7 3.7 4.1  CL 89* 93* 93*  CO2 24 26 25   GLUCOSE 76 84 93  BUN 58* 37* 48*  CREATININE 13.92* 10.20* 12.31*  CALCIUM 8.2* 7.9* 7.9*  MG  --   --  2.3    CBC: Recent Labs  Lab 01/02/22 1015 01/03/22 0503 01/04/22 0447  WBC 6.1 6.3 6.5  NEUTROABS 4.3  --   --   HGB 16.3 15.2 14.6  HCT 49.7 46.0 43.9  MCV 87.7 87.0 87.5  PLT 203 191 197     Urinalysis: No results for input(s): "COLORURINE", "LABSPEC", "PHURINE", "GLUCOSEU", "HGBUR", "BILIRUBINUR", "KETONESUR", "PROTEINUR", "UROBILINOGEN", "NITRITE", "LEUKOCYTESUR" in the last 72 hours.  Invalid input(s): "APPERANCEUR"    Imaging: US ARTERIAL ABI (SCREENING LOWER EXTREMITY)  Result Date: 01/02/2022 CLINICAL DATA:  Limb ischemia, diabetic foot ulcer EXAM: NONINVASIVE PHYSIOLOGIC VASCULAR STUDY OF BILATERAL LOWER EXTREMITIES TECHNIQUE: Evaluation of both lower extremities were performed at rest, including calculation of  ankle-brachial indices with single level Doppler, pressure and pulse volume recording. COMPARISON:  None Available. FINDINGS: Right ABI:  1.02 Left ABI:  0.93 Right Lower Extremity:  Normal arterial waveforms at the ankle. Left Lower Extremity:  Normal arterial waveforms at the ankle. 1.0-1.4 Normal IMPRESSION: Normal resting ABIs Electronically Signed   By: Jerilynn Mages.  Shick M.D.   On: 01/02/2022 15:08   DG Tibia/Fibula Right  Result Date: 01/02/2022 CLINICAL DATA:  Nonhealing ulcer EXAM: RIGHT TIBIA AND FIBULA - 2 VIEW COMPARISON:  None Available. FINDINGS: No fracture or dislocation is seen. There are no focal lytic lesions. There is no effusion in the right knee. Fairly extensive arterial calcifications are seen in soft tissues. There is edema in subcutaneous plane. Plantar spur is seen in calcaneus. IMPRESSION: No fracture or dislocation is seen. There are no focal lytic lesions. Electronically Signed   By: Elmer Picker M.D.   On: 01/02/2022 13:00     Medications:    anticoagulant sodium citrate     cefTRIAXone (ROCEPHIN)  IV 2 g (01/04/22 0930)    amiodarone  200 mg Oral BID   apixaban  2.5 mg Oral BID   bumetanide  1 mg Oral BID   carvedilol  3.125 mg Oral BID   Chlorhexidine Gluconate Cloth  6 each Topical Q0600   furosemide  80 mg Oral BID   gabapentin  300 mg Oral BID   isosorbide  mononitrate  30 mg Oral Daily   losartan  100 mg Oral Daily   NIFEdipine  30 mg Oral Daily   rosuvastatin  40 mg Oral Daily   sertraline  50 mg Oral Daily   sevelamer carbonate  1,600 mg Oral TID WC   silver sulfADIAZINE   Topical Daily    Assessment/ Plan:     Principal Problem:   Skin ulcer of right lower leg, limited to breakdown of skin ([place]) Active Problems:   End stage renal disease (HCC)   Hypertensive heart and kidney disease with heart failure and chronic kidney disease stage V (HCC)   Obesity   Secondary hyperparathyroidism of renal origin ([place])   Type 2 diabetes mellitus with ESRD  (end-stage renal disease) (HCC)   Chronic pain syndrome   Insomnia   Atrial fibrillation (HCC)   Hyperlipidemia associated with type 2 diabetes mellitus ([place])  61 year old male with hypertension, neuropathy, atrial fibrillation on Eliquis and amiodarone, insulin-dependent diabetes mellitus, insomnia, who presents to the emergency department for chief concerns of worsening right lower extremity wound.   #1: ESRD: Patient had a stable dialysis treatment on Friday.  We will continue to maintain on Monday Wednesday Friday schedule.  Dialysis ordered for tomorrow.   #2: Second hyperparathyroidism: We will check PTH, calcium and phosphorus levels.  We will continue the sevelamer at the present doses.   #3: Hypertension: Continue current antihypertensive medications with nifedipine, losartan, isosorbide and carvedilol.  #4: Congestive heart failure: Patient has been on 2 loop diuretics furosemide and Bumex.  Would like to discontinue the furosemide and continue the Bumex.  Advised the patient on importance of fluid restriction.   We will continue to monitor closely.   LOS: 2 Lyla Son, MD [facility] 10/22/202311:14 AM

## 2022-01-04 NOTE — TOC CM/SW Note (Signed)
  Transition of Care [facility]) Screening Note   Patient Details  Name: Byrl Latin Date of Birth: May 26, 1960   Transition of [facility]) CM/SW Contact:    Rebekah [place], [place] Phone Number: 01/04/2022, 10:28 AM    Transition of Care Department [facility]) has reviewed patient and no TOC needs have been identified at this time. We will continue to monitor patient advancement through interdisciplinary progression rounds. If new patient transition needs arise, please place a TOC consult.   Christa See, LCSW Transitions of Care

## 2022-01-04 NOTE — Progress Notes (Signed)
[facility] NAME: Adam Howell    MR#:  [id number]  DATE OF BIRTH:  Oct 08, 1960  SUBJECTIVE:   Patient complains of pain of his right leg. No fever   VITALS:  Blood pressure (!) 109/59, pulse 77, temperature 99.2 F (37.3 C), temperature source Oral, resp. rate 18, height 6\' 3"  (1.905 m), weight 114.4 kg, SpO2 98 %.  PHYSICAL EXAMINATION:   GENERAL:  61 y.o.-year-old patient lying in the bed with no acute distress.  LUNGS: Normal breath sounds bilaterally, no wheezing CARDIOVASCULAR: S1, S2 normal. No murmurs,   ABDOMEN: Soft, nontender, nondistended. Bowel sounds present.  EXTREMITIES:Right distal tibial shin  NEUROLOGIC: nonfocal  patient is alert and awake SKIN: No obvious rash, lesion, or ulcer.   LABORATORY PANEL:  CBC Recent Labs  Lab 01/04/22 0447  WBC 6.5  HGB 14.6  HCT 43.9  PLT 197    Chemistries  Recent Labs  Lab 01/04/22 0447  NA 134*  K 4.1  CL 93*  CO2 25  GLUCOSE 93  BUN 48*  CREATININE 12.31*  CALCIUM 7.9*  MG 2.3   Cardiac Enzymes No results for input(s): "TROPONINI" in the last 168 hours. RADIOLOGY:  US ARTERIAL ABI (SCREENING LOWER EXTREMITY)  Result Date: 01/02/2022 CLINICAL DATA:  Limb ischemia, diabetic foot ulcer EXAM: NONINVASIVE PHYSIOLOGIC VASCULAR STUDY OF BILATERAL LOWER EXTREMITIES TECHNIQUE: Evaluation of both lower extremities were performed at rest, including calculation of ankle-brachial indices with single level Doppler, pressure and pulse volume recording. COMPARISON:  None Available. FINDINGS: Right ABI:  1.02 Left ABI:  0.93 Right Lower Extremity:  Normal arterial waveforms at the ankle. Left Lower Extremity:  Normal arterial waveforms at the ankle. 1.0-1.4 Normal IMPRESSION: Normal resting ABIs Electronically Signed   By: Jerilynn Mages.  Shick M.D.   On: 01/02/2022 15:08   DG Tibia/Fibula Right  Result Date: 01/02/2022 CLINICAL DATA:  Nonhealing ulcer EXAM: RIGHT TIBIA AND  FIBULA - 2 VIEW COMPARISON:  None Available. FINDINGS: No fracture or dislocation is seen. There are no focal lytic lesions. There is no effusion in the right knee. Fairly extensive arterial calcifications are seen in soft tissues. There is edema in subcutaneous plane. Plantar spur is seen in calcaneus. IMPRESSION: No fracture or dislocation is seen. There are no focal lytic lesions. Electronically Signed   By: Elmer Picker M.D.   On: 01/02/2022 13:00    Assessment and Plan  Adam Howell is a 61 year old male with hypertension, atrial fibrillation on Eliquis, diabetes mellitus, ESRD on HD, who presented to the emergency department for chief concerns of worsening right lower extremity wound.   Skin ulcer of right lower leg, limited to breakdown of skin (HCC) --Full thickness wound with black eschar over the top, painful.  This could be from infection, trauma or calciphylaxis. --started on vanc/ceftriaxone on admission --wound care RN consult --cont ceftriaxone for now --oral pain meds --XR tib/fibula No fracture or dislocation is seen. There are no focal lytic lesions. -- Seen by wound consult RN. Continue aggressive dressing changes.   Hyperlipidemia associated with type 2 diabetes mellitus (HCC) - Rosuvastatin    Atrial fibrillation (HCC) --cont amiodarone, coreg --cont eliquis   Insomnia - prn trazodone   Type 2 diabetes mellitus with ESRD (end-stage renal disease) ([place]) --BG have been within inpatient goal. --d/c BG checks and SSI   Obesity BMI 31.52   Hypertensive heart and kidney disease with heart failure and chronic kidney disease stage V ([place]) -  Carvedilol 3.125 mg p.o. twice daily, furosemide 80 mg p.o. twice daily, isosorbide mononitrate 30 mg daily, losartan 100 mg daily, nifedipine 30 mg daily resumed   End stage renal disease ([place]) --iHD per nephro     DVT prophylaxis: [id number] Code Status: Full code  Family Communication:  Level of care: Telemetry  Medical Dispo:   The patient is from: home Anticipated d/c is to: home Anticipated d/c date is: 1-2 days Patient currently is not medically ready to d/c due to: IV abx         TOTAL TIME TAKING CARE OF THIS PATIENT: 35 minutes.  >50% time spent on counselling and coordination of care  Note: This dictation was prepared with Dragon dictation along with smaller phrase technology. Any transcriptional errors that result from this process are unintentional.  Fritzi Mandes M.D    [facility]   CC: Primary care physician; Venita Lick, NP

## 2022-01-05 DIAGNOSIS — L97911 Non-pressure chronic ulcer of unspecified part of right lower leg limited to breakdown of skin: Secondary | ICD-10-CM | POA: Diagnosis not present

## 2022-01-05 LAB — CBC
HCT: 42.5 % (ref 39.0–52.0)
Hemoglobin: 14 g/dL (ref 13.0–17.0)
MCH: 28.6 pg (ref 26.0–34.0)
MCHC: 32.9 g/dL (ref 30.0–36.0)
MCV: 86.9 fL (ref 80.0–100.0)
Platelets: 188 10*3/uL (ref 150–400)
RBC: 4.89 MIL/uL (ref 4.22–5.81)
RDW: 15.7 % — ABNORMAL HIGH (ref 11.5–15.5)
WBC: 6.5 10*3/uL (ref 4.0–10.5)
nRBC: 0 % (ref 0.0–0.2)

## 2022-01-05 LAB — GLUCOSE, CAPILLARY
Glucose-Capillary: 105 mg/dL — ABNORMAL HIGH (ref 70–99)
Glucose-Capillary: 121 mg/dL — ABNORMAL HIGH (ref 70–99)
Glucose-Capillary: 102 mg/dL — ABNORMAL HIGH (ref 70–99)

## 2022-01-05 LAB — RENAL FUNCTION PANEL
Albumin: 2.9 g/dL — ABNORMAL LOW (ref 3.5–5.0)
Anion gap: 13 (ref 5–15)
BUN: 42 mg/dL — ABNORMAL HIGH (ref 6–20)
CO2: 27 mmol/L (ref 22–32)
Calcium: 7.7 mg/dL — ABNORMAL LOW (ref 8.9–10.3)
Chloride: 92 mmol/L — ABNORMAL LOW (ref 98–111)
Creatinine, Ser: 10.77 mg/dL — ABNORMAL HIGH (ref 0.61–1.24)
GFR, Estimated: 5 mL/min — ABNORMAL LOW (ref >60)
Glucose, Bld: 101 mg/dL — ABNORMAL HIGH (ref 70–99)
Phosphorus: 7 mg/dL — ABNORMAL HIGH (ref 2.5–4.6)
Potassium: 4 mmol/L (ref 3.5–5.1)
Sodium: 132 mmol/L — ABNORMAL LOW (ref 135–145)

## 2022-01-05 MED ORDER — PENTAFLUOROPROP-TETRAFLUOROETH EX AERO: 1.0000 | INHALATION_SPRAY | CUTANEOUS | Status: DC | PRN

## 2022-01-05 MED ORDER — SILVER SULFADIAZINE 1 % EX CREA: TOPICAL_CREAM | Freq: Every day | CUTANEOUS | 0 refills | Status: DC

## 2022-01-05 MED ORDER — ALTEPLASE 2 MG IJ SOLR: 2.0000 mg | Freq: Once | INTRAMUSCULAR | Status: DC | PRN

## 2022-01-05 MED ORDER — LIDOCAINE HCL (PF) 1 % IJ SOLN: 5.0000 mL | INTRAMUSCULAR | Status: DC | PRN

## 2022-01-05 MED ORDER — LIDOCAINE-PRILOCAINE 2.5-2.5 % EX CREA: 1.0000 | TOPICAL_CREAM | CUTANEOUS | Status: DC | PRN

## 2022-01-05 MED ORDER — CEPHALEXIN 250 MG PO CAPS: 250.0000 mg | ORAL_CAPSULE | Freq: Two times a day (BID) | ORAL | 0 refills | Status: AC

## 2022-01-05 MED ORDER — CHLORHEXIDINE GLUCONATE CLOTH 2 % EX PADS
6.0000 | MEDICATED_PAD | Freq: Every day | CUTANEOUS | Status: DC
  Administered 2022-01-06: 6 via TOPICAL

## 2022-01-05 MED ORDER — HYDROCODONE-ACETAMINOPHEN 10-325 MG PO TABS: 1.0000 | ORAL_TABLET | Freq: Three times a day (TID) | ORAL | 0 refills | Status: DC | PRN

## 2022-01-05 MED ORDER — HEPARIN SODIUM (PORCINE) 1000 UNIT/ML DIALYSIS: 1000.0000 [IU] | INTRAMUSCULAR | Status: DC | PRN

## 2022-01-05 MED ORDER — CEPHALEXIN 250 MG PO CAPS
250.0000 mg | ORAL_CAPSULE | Freq: Two times a day (BID) | ORAL | Status: DC
  Administered 2022-01-05 – 2022-01-07 (×4): 250 mg via ORAL
  Filled 2022-01-05 (×7): qty 1

## 2022-01-05 MED ORDER — ANTICOAGULANT SODIUM CITRATE 4% (200MG/5ML) IV SOLN: 5.0000 mL | Status: DC | PRN

## 2022-01-05 NOTE — Progress Notes (Signed)
[facility]  PROGRESS NOTE   Subjective:   Seen during dialysis.  Tolerating fair.  Blood pressure low normal. UF goal reduced. Reports that he continues to have pain in his legs   HEMODIALYSIS FLOWSHEET:  Blood Flow Rate (mL/min): 350 mL/min Arterial Pressure (mmHg): -200 mmHg Venous Pressure (mmHg): 210 mmHg TMP (mmHg): 0 mmHg Ultrafiltration Rate (mL/min): 1319 mL/min Dialysate Flow Rate (mL/min): 300 ml/min   Objective:  Vital signs: Blood pressure 111/70, pulse 77, temperature 98.6 F (37 C), temperature source Oral, resp. rate 20, height 6\' 3"  (1.905 m), weight 122 kg, SpO2 98 %.  Intake/Output Summary (Last 24 hours) at 01/05/2022 1352 Last data filed at 01/04/2022 1905 Gross per 24 hour  Intake 240 ml  Output --  Net 240 ml    Filed Weights   01/02/22 1603 01/02/22 1951 01/05/22 0916  Weight: 115.5 kg 114.4 kg 122 kg     Physical Exam: General:  No acute distress  Head:  Normocephalic, atraumatic. Moist oral mucosal membranes  Eyes:  Anicteric  Neck:  Supple  Lungs:   Clear to auscultation, normal effort  Heart:  S1S2 no rubs  Abdomen:   Soft, nontender, bowel sounds present  Extremities:  peripheral edema.  Neurologic:  Awake, alert, following commands  Skin: Ulcer on the right leg  Access:     Basic Metabolic Panel: Recent Labs  Lab 01/02/22 1015 01/03/22 0503 01/04/22 0447 01/05/22 1430  NA 135 135 134* 132*  K 4.7 3.7 4.1 4.0  CL 89* 93* 93* 92*  CO2 24 26 25 27   GLUCOSE 76 84 93 101*  BUN 58* 37* 48* 42*  CREATININE 13.92* 10.20* 12.31* 10.77*  CALCIUM 8.2* 7.9* 7.9* 7.7*  MG  --   --  2.3  --   PHOS  --   --   --  7.0*     CBC: Recent Labs  Lab 01/02/22 1015 01/03/22 0503 01/04/22 0447 01/05/22 0756  WBC 6.1 6.3 6.5 6.5  NEUTROABS 4.3  --   --   --   HGB 16.3 15.2 14.6 14.0  HCT 49.7 46.0 43.9 42.5  MCV 87.7 87.0 87.5 86.9  PLT 203 191 197 188      Urinalysis: No results for input(s): "COLORURINE",  "LABSPEC", "PHURINE", "GLUCOSEU", "HGBUR", "BILIRUBINUR", "KETONESUR", "PROTEINUR", "UROBILINOGEN", "NITRITE", "LEUKOCYTESUR" in the last 72 hours.  Invalid input(s): "APPERANCEUR"    Imaging: No results found.   Medications:    anticoagulant sodium citrate      amiodarone  200 mg Oral BID   apixaban  2.5 mg Oral BID   bumetanide  1 mg Oral BID   carvedilol  3.125 mg Oral BID   cephALEXin  250 mg Oral Q12H   Chlorhexidine Gluconate Cloth  6 each Topical Q0600   Chlorhexidine Gluconate Cloth  6 each Topical Q0600   gabapentin  300 mg Oral BID   insulin aspart  0-5 Units Subcutaneous QHS   insulin aspart  0-9 Units Subcutaneous TID WC   isosorbide mononitrate  30 mg Oral Daily   losartan  100 mg Oral Daily   NIFEdipine  30 mg Oral Daily   rosuvastatin  40 mg Oral Daily   sertraline  50 mg Oral Daily   sevelamer carbonate  1,600 mg Oral TID WC   silver sulfADIAZINE   Topical Daily    Assessment/ Plan:     Principal Problem:   Skin ulcer of right lower leg, limited to breakdown of skin ([place]) Active  Problems:   End stage renal disease (HCC)   Hypertensive heart and kidney disease with heart failure and chronic kidney disease stage V (HCC)   Obesity   Secondary hyperparathyroidism of renal origin ([place])   Type 2 diabetes mellitus with ESRD (end-stage renal disease) (HCC)   Chronic pain syndrome   Insomnia   Atrial fibrillation (HCC)   Hyperlipidemia associated with type 2 diabetes mellitus ([place])  61 year old male with hypertension, neuropathy, atrial fibrillation on Eliquis and amiodarone, insulin-dependent diabetes mellitus, insomnia, who presents to the emergency department for chief concerns of worsening right lower extremity wound.   #1: ESRD: We will continue to maintain on Monday Wednesday Friday schedule.  Expected to be discharged this afternoon.  Patient will continue his hemodialysis as outpatient.   #2: Second hyperparathyroidism: Monitor calcium and phosphorus  during this admission.   #3: Hypertension: Continue current antihypertensive medications with nifedipine, losartan, isosorbide and carvedilol.  #4: Congestive heart failure:  2D echo 01/10/2020-LVEF 35 to 40%, global hypokinesis, severe concentric LVH Continue Bumex.   LOS: [place], MD [facility] 10/23/20231:52 PM

## 2022-01-05 NOTE — Progress Notes (Signed)
PT did 3.5 hrs of tx, tolerated well with no signs of distress  UF = 1000 ml  Report given to floor RN    01/05/22 1430  Vitals  Temp 98.2 F (36.8 C)  Temp Source Oral  BP 114/68  MAP (mmHg) 81  BP Location Right Arm  BP Method Automatic  Patient Position (if appropriate) Lying  Pulse Rate 81  Pulse Rate Source Monitor  ECG Heart Rate 82  Resp (!) 23  Oxygen Therapy  SpO2 99 %  O2 Device Nasal Cannula  O2 Flow Rate (L/min) 3 L/min  Patient Activity (if Appropriate) In bed  Pulse Oximetry Type Continuous  During Treatment Monitoring  Blood Flow Rate (mL/min) 200 mL/min  HD Safety Checks Performed Yes  Intra-Hemodialysis Comments Tx completed  Dialysis Fluid Bolus Normal Saline  Post Treatment  Dialyzer Clearance Heavily streaked  Duration of HD Treatment -hour(s) 3.5 hour(s)  Hemodialysis Intake (mL) 600 mL  Liters Processed 75.6  Fluid Removed 1000 mL  Tolerated HD Treatment Yes  AVG/AVF Arterial Site Held (minutes) 10 minutes  AVG/AVF Venous Site Held (minutes) 10 minutes  Note  Observations pt alert and oriented  Fistula / Graft Left Forearm Arteriovenous fistula  No placement date or time found.   Placed prior to admission: Yes  Orientation: Left  Access Location: Forearm  Access Type: Arteriovenous fistula  Site Condition No complications  Fistula / Graft Assessment Bruit;Thrill;Present  Status Deaccessed  Needle Size 15  Drainage Description None

## 2022-01-05 NOTE — Progress Notes (Signed)
Mobility Specialist - Progress Note   01/05/22 1600  Mobility  Activity Ambulated with assistance in hallway  Level of Assistance Standby assist, set-up cues, supervision of patient - no hands on  Assistive Device Front wheel walker  Distance Ambulated (ft) 120 ft  $Mobility charge 1 Mobility     Pre-mobility: 82 HR, 95% SpO2 Post-mobility: 86 HR, 95% SpO2   Pt lying in bed upon arrival, utilizing RA. Pt voiced pain in RLE 10/10 at rest. Able to complete bed mobility with minA and extra time. ModA STS from elevated bed height with extensive time needed---vc for weight-bearing onto UE and LLE to offset pain. Once upright, pt able to ambulate 120' with minG. WBAT on RLE. Denied SOB and dizziness. Pt lateral scoots towards HOB before returning supine with supervision. Pillow support for off-loading heels. Pt left in bed with needs in reach. RN notified.    Kathee Delton Mobility Specialist 01/05/22, 4:29 PM

## 2022-01-05 NOTE — Care Management Important Message (Signed)
Important Message  Patient Details  Name: Adam Howell MRN: [id number] Date of Birth: February 24, 1961   Medicare Important Message Given:  Yes     Dannette Barbara 01/05/2022, 3:42 PM

## 2022-01-05 NOTE — Plan of Care (Signed)
Pt alert and oriented x 4. Pt BP was low at hs. SBP in 90's. Morton Amy NP contacted due to amiodarone, bumex and coreg due. Pt BP later improved and NP requested to give amiodarone and bumex. Pt received 1 dose of Norco this shift. Due for hemo this am but currently not on schedule. Others vitals stable.  Problem: Education: Goal: Ability to describe self-care measures that may prevent or decrease complications (Diabetes Survival Skills Education) will improve Outcome: Progressing Goal: Individualized Educational Video(s) Outcome: Progressing   Problem: Coping: Goal: Ability to adjust to condition or change in health will improve Outcome: Progressing   Problem: Fluid Volume: Goal: Ability to maintain a balanced intake and output will improve Outcome: Progressing   Problem: Health Behavior/Discharge Planning: Goal: Ability to identify and utilize available resources and services will improve Outcome: Progressing Goal: Ability to manage health-related needs will improve Outcome: Progressing   Problem: Metabolic: Goal: Ability to maintain appropriate glucose levels will improve Outcome: Progressing   Problem: Nutritional: Goal: Maintenance of adequate nutrition will improve Outcome: Progressing Goal: Progress toward achieving an optimal weight will improve Outcome: Progressing   Problem: Skin Integrity: Goal: Risk for impaired skin integrity will decrease Outcome: Progressing   Problem: Tissue Perfusion: Goal: Adequacy of tissue perfusion will improve Outcome: Progressing   Problem: Education: Goal: Knowledge of General Education information will improve Description: Including pain rating scale, medication(s)/side effects and non-pharmacologic comfort measures Outcome: Progressing   Problem: Health Behavior/Discharge Planning: Goal: Ability to manage health-related needs will improve Outcome: Progressing   Problem: Clinical Measurements: Goal: Ability to maintain clinical  measurements within normal limits will improve Outcome: Progressing Goal: Will remain free from infection Outcome: Progressing Goal: Diagnostic test results will improve Outcome: Progressing Goal: Respiratory complications will improve Outcome: Progressing Goal: Cardiovascular complication will be avoided Outcome: Progressing   Problem: Activity: Goal: Risk for activity intolerance will decrease Outcome: Progressing   Problem: Nutrition: Goal: Adequate nutrition will be maintained Outcome: Progressing   Problem: Coping: Goal: Level of anxiety will decrease Outcome: Progressing   Problem: Elimination: Goal: Will not experience complications related to bowel motility Outcome: Progressing Goal: Will not experience complications related to urinary retention Outcome: Progressing   Problem: Pain Managment: Goal: General experience of comfort will improve Outcome: Progressing   Problem: Safety: Goal: Ability to remain free from injury will improve Outcome: Progressing   Problem: Skin Integrity: Goal: Risk for impaired skin integrity will decrease Outcome: Progressing

## 2022-01-05 NOTE — Progress Notes (Signed)
[facility] [place] NAME: Adam Howell    MR#:  [id number]  DATE OF BIRTH:  Sep 26, 1960  SUBJECTIVE:   Patient complains of pain of his right leg. No fever   VITALS:  Blood pressure 114/68, pulse 81, temperature 98.2 F (36.8 C), temperature source Oral, resp. rate (!) 23, height 6\' 3"  (1.905 m), weight 121 kg, SpO2 99 %.  PHYSICAL EXAMINATION:   GENERAL:  61 y.o.-year-old patient lying in the bed with no acute distress.  LUNGS: Normal breath sounds bilaterally, no wheezing CARDIOVASCULAR: S1, S2 normal. No murmurs,   ABDOMEN: Soft, nontender, nondistended. Bowel sounds present.  EXTREMITIES:Right distal tibial shin  NEUROLOGIC: nonfocal  patient is alert and awake SKIN: No obvious rash, lesion, or ulcer.   LABORATORY PANEL:  CBC Recent Labs  Lab 01/05/22 0756  WBC 6.5  HGB 14.0  HCT 42.5  PLT 188     Chemistries  Recent Labs  Lab 01/04/22 0447 01/05/22 1430  NA 134* 132*  K 4.1 4.0  CL 93* 92*  CO2 25 27  GLUCOSE 93 101*  BUN 48* 42*  CREATININE 12.31* 10.77*  CALCIUM 7.9* 7.7*  MG 2.3  --     Cardiac Enzymes No results for input(s): "TROPONINI" in the last 168 hours. RADIOLOGY:  No results found.  Assessment and Plan  Adam Howell is a 61 year old male with hypertension, atrial fibrillation on Eliquis, diabetes mellitus, ESRD on HD, who presented to the emergency department for chief concerns of worsening right lower extremity wound.   Skin ulcer of right lower leg, limited to breakdown of skin (HCC) --Full thickness wound with black eschar over the top, painful.  suspected Pyoderma --started on vanc/ceftriaxone on admission --wound care RN consult --cont ceftriaxone for now--change to po abxs  --oral pain meds --XR tib/fibula No fracture or dislocation is seen. There are no focal lytic lesions. -- Seen by wound consult RN. Continue aggressive dressing changes. --pt will need to f/u with  PCP--may need derm referral.  --Dr Trinda Pascal aware of it   Hyperlipidemia associated with type 2 diabetes mellitus ([place]) - Rosuvastatin    Atrial fibrillation ([place]) --cont amiodarone, coreg --cont eliquis   Insomnia - prn trazodone   Type 2 diabetes mellitus with ESRD (end-stage renal disease) ([place]) --BG have been within inpatient goal. --d/c BG checks and SSI   Obesity BMI 31.52   Hypertensive heart and kidney disease with heart failure and chronic kidney disease stage V (HCC) - Carvedilol 3.125 mg p.o. twice daily, furosemide 80 mg p.o. twice daily, isosorbide mononitrate 30 mg daily, losartan 100 mg daily, nifedipine 30 mg daily resumed   End stage renal disease ([place]) --iHD per nephro  Pt ambulated 120 ft with MT with walker. Reports does not want to go home today.  Will have PT see tomorrow and d/c home with HHPT     DVT prophylaxis: [id number] Code Status: Full code  Family Communication: dter michelle on the phone Level of care: Telemetry Medical Dispo:   The patient is from: home Anticipated d/c is to: home Anticipated d/c date is:10/24 Patient currently is not medically ready to d/c due to: pt not wanting to go home!         TOTAL TIME TAKING CARE OF THIS PATIENT: 35 minutes.  >50% time spent on counselling and coordination of care  Note: This dictation was prepared with Dragon dictation along with smaller phrase technology. Any transcriptional errors that result from  this process are unintentional.  Fritzi Mandes M.D    [facility]   CC: Primary care physician; Venita Lick, NP

## 2022-01-06 ENCOUNTER — Ambulatory Visit: Payer: Medicare Other | Admitting: Nurse Practitioner

## 2022-01-06 DIAGNOSIS — L97911 Non-pressure chronic ulcer of unspecified part of right lower leg limited to breakdown of skin: Secondary | ICD-10-CM | POA: Diagnosis not present

## 2022-01-06 LAB — GLUCOSE, CAPILLARY
Glucose-Capillary: 117 mg/dL — ABNORMAL HIGH (ref 70–99)
Glucose-Capillary: 103 mg/dL — ABNORMAL HIGH (ref 70–99)
Glucose-Capillary: 133 mg/dL — ABNORMAL HIGH (ref 70–99)
Glucose-Capillary: 112 mg/dL — ABNORMAL HIGH (ref 70–99)

## 2022-01-06 MED ORDER — TRAMADOL HCL 50 MG PO TABS: 50.0000 mg | ORAL_TABLET | Freq: Four times a day (QID) | ORAL | Status: DC | PRN

## 2022-01-06 MED ORDER — HYDROCODONE-ACETAMINOPHEN 10-325 MG PO TABS
1.0000 | ORAL_TABLET | Freq: Four times a day (QID) | ORAL | Status: DC | PRN
  Administered 2022-01-06 – 2022-01-07 (×3): 1 via ORAL
  Filled 2022-01-06 (×3): qty 1

## 2022-01-06 MED ORDER — SODIUM CHLORIDE 0.9 % IV BOLUS
500.0000 mL | Freq: Once | INTRAVENOUS | Status: AC
  Administered 2022-01-06: 500 mL via INTRAVENOUS

## 2022-01-06 MED ORDER — PREGABALIN 50 MG PO CAPS
50.0000 mg | ORAL_CAPSULE | Freq: Every day | ORAL | Status: DC
  Administered 2022-01-06 – 2022-01-07 (×2): 50 mg via ORAL
  Filled 2022-01-06 (×2): qty 1

## 2022-01-06 NOTE — Progress Notes (Signed)
PT Cancellation Note  Patient Details Name: Adam Howell MRN: [id number] DOB: 08-06-1960   Cancelled Treatment:    Reason Eval/Treat Not Completed: Fatigue/lethargy limiting ability to participate. Orders received and chart reviewed. Upon entry to room pt lethargic. Unable to awaken to light, tactile stimulation to arm, gentle sternal rub. Pt awakens intermittently briefly to answer yes/no questions but quickly returns to sleeping and snoring. RN and MD updated. Notable soft BP this morning. Medical Team aware PT to re-attempt later today as medically appropriate.    Salem Caster. Fairly IV, PT, DPT Physical Therapist- [facility]  01/06/2022, 11:18 AM

## 2022-01-06 NOTE — Progress Notes (Signed)
Patient's blood pressure is 92/46. Consulted MD about which medications to give/hold. Verbal orders received to hold coreg and imdur. Verified that other medications are to be given.

## 2022-01-06 NOTE — Progress Notes (Signed)
[facility] [place] NAME: Adam Howell    MR#:  [id number]  DATE OF BIRTH:  Mar 06, 1961  SUBJECTIVE:   Pt has UF of 1000 cc yda--bp low this am. Received IVF 500 cc bolus. Bp in leg 108/56 More sleepy, ate some BF, did respond to most question well with eyes closed   VITALS:  Blood pressure (!) 108/56, pulse 70, temperature 98.9 F (37.2 C), resp. rate 14, height 6\' 3"  (1.905 m), weight 121 kg, SpO2 91 %.  PHYSICAL EXAMINATION:   GENERAL:  61 y.o.-year-old patient lying in the bed with no acute distress.  LUNGS: Normal breath sounds bilaterally, no wheezing CARDIOVASCULAR: S1, S2 normal. No murmurs,   ABDOMEN: Soft, nontender, nondistended. Bowel sounds present.  EXTREMITIES:Right distal tibial shin  NEUROLOGIC: nonfocal  patient is alert and awake SKIN: No obvious rash, lesion, or ulcer.   LABORATORY PANEL:  CBC Recent Labs  Lab 01/05/22 0756  WBC 6.5  HGB 14.0  HCT 42.5  PLT 188     Chemistries  Recent Labs  Lab 01/04/22 0447 01/05/22 1430  NA 134* 132*  K 4.1 4.0  CL 93* 92*  CO2 25 27  GLUCOSE 93 101*  BUN 48* 42*  CREATININE 12.31* 10.77*  CALCIUM 7.9* 7.7*  MG 2.3  --     Cardiac Enzymes No results for input(s): "TROPONINI" in the last 168 hours. RADIOLOGY:  No results found.  Assessment and Plan  Adam Howell is a 61 year old male with hypertension, atrial fibrillation on Eliquis, diabetes mellitus, ESRD on HD, who presented to the emergency department for chief concerns of worsening right lower extremity wound.   Skin ulcer of right lower leg, limited to breakdown of skin (HCC) --Full thickness wound with black eschar over the top, painful.  suspected Pyoderma --started on vanc/ceftriaxone on admission --wound care RN consult --cont ceftriaxone for now--change to po abxs  --oral pain meds --XR tib/fibula No fracture or dislocation is seen. There are no focal lytic lesions. -- Seen by  wound consult RN. Continue aggressive dressing changes. --pt will need to f/u with PCP--may need derm referral.  --Dr Trinda Pascal aware of it   Hyperlipidemia associated with type 2 diabetes mellitus ([place]) - Rosuvastatin    Atrial fibrillation ([place]) --cont amiodarone, coreg --cont eliquis   Insomnia - prn trazodone   Type 2 diabetes mellitus with ESRD (end-stage renal disease) ([place]) --BG have been within inpatient goal. --d/c BG checks and SSI   Obesity BMI 31.52   Hypertensive heart and kidney disease with heart failure and chronic kidney disease stage V (HCC) Hypotension today --hold bp meds for now --got IV ns 500 cc bolus   End stage renal disease ([place]) --iHD per nephro  Pt ambulated 120 ft with MT with walker.  Will have PT see today once more awake       DVT prophylaxis: [id number] Code Status: Full code  Family Communication: dter michelle on the phone 10/23 Level of care: Telemetry Medical Dispo:   The patient is from: home Anticipated d/c is to: home Anticipated d/c date is likley tomorrow Patient currently is not medically ready to d/c due to:low BP and pending full PT eval         TOTAL TIME TAKING CARE OF THIS PATIENT: 35 minutes.  >50% time spent on counselling and coordination of care  Note: This dictation was prepared with Dragon dictation along with smaller phrase technology. Any transcriptional errors that result  from this process are unintentional.  Fritzi Mandes M.D    [facility]   CC: Primary care physician; Venita Lick, NP

## 2022-01-06 NOTE — Progress Notes (Signed)
[facility]  PROGRESS NOTE   Subjective:   Patient seen sitting up in bed, alert and oriented Reports pain in bilateral lower extremities, worse on right. Tolerated dialysis treatment well yesterday. Tolerating meals, remains on room air  Objective:  Vital signs: Blood pressure (!) 92/46, pulse 70, temperature 98.9 F (37.2 C), resp. rate 14, height 6\' 3"  (1.905 m), weight 121 kg, SpO2 91 %.  Intake/Output Summary (Last 24 hours) at 01/06/2022 1111 Last data filed at 01/05/2022 1917 Gross per 24 hour  Intake 240 ml  Output 1000 ml  Net -760 ml    Filed Weights   01/02/22 1951 01/05/22 0916 01/05/22 1503  Weight: 114.4 kg 122 kg 121 kg     Physical Exam: General:  No acute distress  Head:  Normocephalic, atraumatic. Moist oral mucosal membranes  Eyes:  Anicteric  Lungs:   Clear to auscultation, normal effort  Heart:  S1S2 no rubs  Abdomen:   Soft, nontender, bowel sounds present  Extremities:  peripheral edema.  Neurologic:  Awake, alert, following commands  Skin: Ulcer on the right leg  Access: Left aVF    Basic Metabolic Panel: Recent Labs  Lab 01/02/22 1015 01/03/22 0503 01/04/22 0447 01/05/22 1430  NA 135 135 134* 132*  K 4.7 3.7 4.1 4.0  CL 89* 93* 93* 92*  CO2 24 26 25 27   GLUCOSE 76 84 93 101*  BUN 58* 37* 48* 42*  CREATININE 13.92* 10.20* 12.31* 10.77*  CALCIUM 8.2* 7.9* 7.9* 7.7*  MG  --   --  2.3  --   PHOS  --   --   --  7.0*     CBC: Recent Labs  Lab 01/02/22 1015 01/03/22 0503 01/04/22 0447 01/05/22 0756  WBC 6.1 6.3 6.5 6.5  NEUTROABS 4.3  --   --   --   HGB 16.3 15.2 14.6 14.0  HCT 49.7 46.0 43.9 42.5  MCV 87.7 87.0 87.5 86.9  PLT 203 191 197 188      Urinalysis: No results for input(s): "COLORURINE", "LABSPEC", "PHURINE", "GLUCOSEU", "HGBUR", "BILIRUBINUR", "KETONESUR", "PROTEINUR", "UROBILINOGEN", "NITRITE", "LEUKOCYTESUR" in the last 72 hours.  Invalid input(s): "APPERANCEUR"    Imaging: No results  found.   Medications:    anticoagulant sodium citrate      amiodarone  200 mg Oral BID   apixaban  2.5 mg Oral BID   carvedilol  3.125 mg Oral BID   cephALEXin  250 mg Oral Q12H   Chlorhexidine Gluconate Cloth  6 each Topical Q0600   Chlorhexidine Gluconate Cloth  6 each Topical Q0600   gabapentin  300 mg Oral BID   insulin aspart  0-5 Units Subcutaneous QHS   insulin aspart  0-9 Units Subcutaneous TID WC   rosuvastatin  40 mg Oral Daily   sertraline  50 mg Oral Daily   sevelamer carbonate  1,600 mg Oral TID WC   silver sulfADIAZINE   Topical Daily    Assessment/ Plan:     Principal Problem:   Skin ulcer of right lower leg, limited to breakdown of skin ([place]) Active Problems:   End stage renal disease ([place])   Hypertensive heart and kidney disease with heart failure and chronic kidney disease stage V ([place])   Obesity   Secondary hyperparathyroidism of renal origin ([place])   Type 2 diabetes mellitus with ESRD (end-stage renal disease) (H[facility])   Chronic pain syndrome   Insomnia   Atrial fibrillation ([place])   Hyperlipidemia associated with type 2 diabetes  mellitus ([place])  61 year old male with hypertension, neuropathy, atrial fibrillation on Eliquis and amiodarone, insulin-dependent diabetes mellitus, insomnia, who presents to the emergency department for chief concerns of worsening right lower extremity wound.   #1: ESRD: We will continue to maintain on Monday Wednesday Friday schedule.  Completed dialysis treatment yesterday, UF 1 L achieved.  Scheduled to discharge today.   #2: Second hyperparathyroidism: We will continue to monitor bone minerals.   #3: Hypertension with chronic kidney disease: Was receiving nifedipine, losartan, isosorbide and carvedilol.  Blood pressure soft this morning, 92/46.  Blood pressure medicines adjusted, only receiving carvedilol and amiodarone.  #4: Congestive heart failure:  2D echo 01/10/2020-LVEF 35 to 40%, global hypokinesis, severe concentric  LVH Continue Bumex.   LOS: [facility] 10/24/202311:11 AM

## 2022-01-06 NOTE — Evaluation (Signed)
Occupational Therapy Evaluation Patient Details Name: Adam Howell MRN: [id number] DOB: Jul 19, 1960 Today's Date: 01/06/2022   History of Present Illness [name] is a 61 year old male with hypertension, neuropathy, atrial fibrillation on Eliquis and amiodarone, insulin-dependent diabetes mellitus, insomnia, who presents to the emergency department for chief concerns of worsening right lower extremity wound.   Clinical Impression   Patient seen for OT evaluation. Pt presenting with decreased independence in self care, balance, functional mobility/transfers, and endurance. At baseline, pt is independent with ADLs/IADLs and lives with his daughter who is available 24/7. Pt currently functioning at Min-Mod A to stand from recliner using RW, set up-supervision for seated grooming tasks, and Max A for LB dressing. Pt's activity tolerance limited by 10/10 pain in RLE this date, RN aware. Pt will benefit from acute OT to increase overall independence in the areas of ADLs and functional mobility in order to safely discharge home. Pt could benefit from [facility] following D/C to decrease falls risk, improve balance, and maximize independence in self-care within own home environment.      Recommendations for follow up therapy are one component of a multi-disciplinary discharge planning process, led by the attending physician.  Recommendations may be updated based on patient status, additional functional criteria and insurance authorization.   Follow Up Recommendations  Home health OT    Assistance Recommended at Discharge Frequent or constant Supervision/Assistance  Patient can return home with the following A little help with walking and/or transfers;Assistance with cooking/housework;Assist for transportation;Help with stairs or ramp for entrance;A lot of help with bathing/dressing/bathroom    Functional Status Assessment  Patient has had a recent decline in their functional status and  demonstrates the ability to make significant improvements in function in a reasonable and predictable amount of time.  Equipment Recommendations  BSC/3in1    Recommendations for Other Services       Precautions / Restrictions Precautions Precautions: Fall Restrictions Weight Bearing Restrictions: No      Mobility Bed Mobility               General bed mobility comments: NT, received/left in recliner    Transfers Overall transfer level: Needs assistance Equipment used: Rolling walker (2 wheels) Transfers: Sit to/from Stand Sit to Stand: Min assist, Mod assist           General transfer comment: VC for hand placement      Balance Overall balance assessment: Needs assistance Sitting-balance support: Feet supported, No upper extremity supported Sitting balance-Leahy Scale: Fair   Postural control: Posterior lean Standing balance support: Bilateral upper extremity supported, During functional activity, Reliant on assistive device for balance Standing balance-Leahy Scale: Poor Standing balance comment: pt with posterior LOB upon standing requiring assistance to correct                           ADL either performed or assessed with clinical judgement   ADL Overall ADL's : Needs assistance/impaired     Grooming: Set up;Wash/dry face;Sitting               Lower Body Dressing: Maximal assistance;Sitting/lateral leans   Toilet Transfer: Rolling walker (2 wheels);Minimal assistance;Moderate assistance Toilet Transfer Details (indicate cue type and reason): simulated with STS from [place] and Hygiene: Maximal assistance;Sit to/from stand       Functional mobility during ADLs: Min guard;Rolling walker (2 wheels) (to take one step forward/backward at recliner) General ADL Comments: pt deferred completing functional  mobility into bathroom 2/2 RLE pain     Vision Patient Visual Report: No change from baseline        Perception     Praxis      Pertinent Vitals/Pain Pain Assessment Pain Assessment: 0-10 Pain Score: 10-Worst pain ever Pain Location: Anterior R shin at wound Pain Descriptors / Indicators: Grimacing, Discomfort Pain Intervention(s): Limited activity within patient's tolerance, Monitored during session, Repositioned     Hand Dominance     Extremity/Trunk Assessment Upper Extremity Assessment Upper Extremity Assessment: Overall WFL for tasks assessed   Lower Extremity Assessment Lower Extremity Assessment: Generalized weakness       Communication     Cognition Arousal/Alertness: Awake/alert Behavior During Therapy: Flat affect Overall Cognitive Status: Difficult to assess                                 General Comments: Oriented to self, grossly to time, and "hospital" (could not state name of hospital). Pt not very clear with subjective info. Follows simple commands with increased time.     General Comments       Exercises Other Exercises Other Exercises: OT provided education re: role of OT, OT POC, post acute recs, sitting up for all meals, EOB/OOB mobility with assistance, home/fall safety.     Shoulder Instructions      Home Living Family/patient expects to be discharged to:: Private residence Living Arrangements: Children Available Help at Discharge: Family;Available 24 hours/day Type of Home: Apartment Home Access: Level entry     Home Layout: One level     Bathroom Shower/Tub: Teacher, early years/pre: Handicapped height     Home Equipment: None   Additional Comments: Pt reports he lives with daughter who does not work      Prior Functioning/Environment Prior Level of Function : Independent/Modified Independent;Driving               ADLs Comments: Pt endorses being independent for ADLs/IADLs. Shared responsibility for household management tasks with daughter. Not working.        OT Problem List:  Decreased strength;Decreased knowledge of use of DME or AE;Decreased activity tolerance;Impaired balance (sitting and/or standing);Pain;Decreased range of motion      OT Treatment/Interventions: Self-care/ADL training;Patient/family education;Therapeutic exercise;Balance training;Energy conservation;Therapeutic activities;DME and/or AE instruction    OT Goals(Current goals can be found in the care plan section) Acute Rehab OT Goals Patient Stated Goal: go home, reduce pain OT Goal Formulation: With patient Time For Goal Achievement: 01/20/22 Potential to Achieve Goals: Good   OT Frequency: Min 2X/week    Co-evaluation              AM-PAC OT "6 Clicks" Daily Activity     Outcome Measure Help from another person eating meals?: None Help from another person taking care of personal grooming?: A Little Help from another person toileting, which includes using toliet, bedpan, or urinal?: A Lot Help from another person bathing (including washing, rinsing, drying)?: A Lot Help from another person to put on and taking off regular upper body clothing?: None Help from another person to put on and taking off regular lower body clothing?: A Lot 6 Click Score: 17   End of Session Equipment Utilized During Treatment: Gait belt;Rolling walker (2 wheels) Nurse Communication: Mobility status  Activity Tolerance: Patient limited by pain Patient left: in chair;with call bell/phone within reach  OT Visit Diagnosis: Unsteadiness on feet (R26.81);Muscle weakness (  generalized) (M62.81);Pain Pain - Right/Left: Right Pain - part of body: Leg                Time: 0762-2633 OT Time Calculation (min): 12 min Charges:  OT General Charges $OT Visit: 1 Visit OT Evaluation $OT Eval Moderate Complexity: [place] MS, OTR/L ascom [phone]  01/06/22, 6:38 PM

## 2022-01-06 NOTE — Evaluation (Signed)
Physical Therapy Evaluation Patient Details Name: Adam Howell MRN: [id number] DOB: 1961/02/13 Today's Date: 01/06/2022  History of Present Illness  [name] is a 61 year old male with hypertension, neuropathy, atrial fibrillation on Eliquis and amiodarone, insulin-dependent diabetes mellitus, insomnia, who presents to the emergency department for chief concerns of worsening right lower extremity wound.   Clinical Impression  Pt admitted with above diagnosis. Pt received upright in recliner agreeable to PT services. Wife on speaker phone during subjective reports. Reports married but she lives in [place]. Pt states at baseline he lives with daughter who is present 24/7. Variable responses noted during subjective reports on home lay out, DME, etc requiring simplified, broken down repetition of questions for pt to respond appropriately.Typically indep at baseline for ADL's/IADL's.   To date, Pt required modA to stand from recliner. Unable to attain full standing reporting LBP and stiffness. Pt returned to sitting and palced RW in front of pt. PT able to then stand modA to RW and with UE's attains upright posture. Pt ambulates 10' in room requesting seated rest due to back pain and RLE pain. After 2 minute rest, pt standing with very minA ambulating ~140' total with chair follow. Overall slowed gait cadence appreciated but good stability with RW. Does rely heavily on UE support on RW but unclear due to pain in RLE, muscle weakness, or LBP. Pt requesting seat and transported back to room. Encouraged pt on functional mobility with nursing staff to improve mobility, independence, and strength in prep for d/c home. Pt left in recliner with all needs in reach. Understanding to contact RN if pt requesting assistance. Pt currently with functional limitations due to the deficits listed below (see PT Problem List). Pt will benefit from skilled PT to increase their independence and safety with mobility  to allow discharge to the venue listed below.     Recommendations for follow up therapy are one component of a multi-disciplinary discharge planning process, led by the attending physician.  Recommendations may be updated based on patient status, additional functional criteria and insurance authorization.  Follow Up Recommendations Home health PT      Assistance Recommended at Discharge Frequent or constant Supervision/Assistance  Patient can return home with the following  A little help with walking and/or transfers;Assistance with cooking/housework;Assist for transportation;Help with stairs or ramp for entrance    Equipment Recommendations Rolling walker (2 wheels);BSC/3in1  Recommendations for Other Services       Functional Status Assessment Patient has had a recent decline in their functional status and demonstrates the ability to make significant improvements in function in a reasonable and predictable amount of time.     Precautions / Restrictions Precautions Precautions: Fall Restrictions Weight Bearing Restrictions: No      Mobility  Bed Mobility                 Patient Response: Cooperative  Transfers Overall transfer level: Needs assistance Equipment used: Rolling walker (2 wheels) Transfers: Sit to/from Stand Sit to Stand: Mod assist, Min assist           General transfer comment: initially modA for first STS. Then able to perfrom second STS with very minimal assist.    Ambulation/Gait Ambulation/Gait assistance: Min guard Gait Distance (Feet): 145 Feet Assistive device: Rolling walker (2 wheels) Gait Pattern/deviations: Step-through pattern, Decreased step length - right, Decreased step length - left, Decreased stance time - right       General Gait Details: Pt slow but consistent cadence. Mild  antalgic gait noted on RLE. Pt requiring chair follow due to fatigue. x2 seated rest breaks during bout.  Stairs            Wheelchair  Mobility    Modified Rankin (Stroke Patients Only)       Balance Overall balance assessment: Needs assistance Sitting-balance support: Feet supported, No upper extremity supported Sitting balance-Leahy Scale: Fair     Standing balance support: Bilateral upper extremity supported, During functional activity, Reliant on assistive device for balance Standing balance-Leahy Scale: Fair                               Pertinent Vitals/Pain Pain Assessment Pain Assessment: 0-10 Pain Score: 10-Worst pain ever Pain Location: Anterior R shin at wound Pain Descriptors / Indicators: Grimacing Pain Intervention(s): Limited activity within patient's tolerance, Monitored during session, Repositioned    Home Living Family/patient expects to be discharged to:: Private residence Living Arrangements: Children Available Help at Discharge: Family;Available 24 hours/day Type of Home: Apartment Home Access: Level entry       Home Layout: One level Home Equipment: None      Prior Function Prior Level of Function : Independent/Modified Independent                     Hand Dominance        Extremity/Trunk Assessment   Upper Extremity Assessment Upper Extremity Assessment: Overall WFL for tasks assessed    Lower Extremity Assessment Lower Extremity Assessment: Generalized weakness       Communication      Cognition Arousal/Alertness: Awake/alert Behavior During Therapy: Flat affect Overall Cognitive Status: Difficult to assess                                 General Comments: Pt not very clear with subjective info. Follows simple commands with increased time.        General Comments      Exercises Other Exercises Other Exercises: Role of PT in acute setting, d/c recs, safe use of DME   Assessment/Plan    PT Assessment Patient needs continued PT services  PT Problem List Decreased strength;Decreased mobility;Decreased activity  tolerance;Pain;Decreased skin integrity       PT Treatment Interventions DME instruction;Therapeutic exercise;Gait training;Balance training;Neuromuscular re-education;Therapeutic activities;Functional mobility training;Patient/family education    PT Goals (Current goals can be found in the Care Plan section)  Acute Rehab PT Goals Patient Stated Goal: improve RLE pain, return home PT Goal Formulation: With patient Time For Goal Achievement: 01/20/22 Potential to Achieve Goals: Good    Frequency Min 2X/week     Co-evaluation               AM-PAC PT "6 Clicks" Mobility  Outcome Measure Help needed turning from your back to your side while in a flat bed without using bedrails?: A Little Help needed moving from lying on your back to sitting on the side of a flat bed without using bedrails?: A Little Help needed moving to and from a bed to a chair (including a wheelchair)?: A Little Help needed standing up from a chair using your arms (e.g., wheelchair or bedside chair)?: A Lot Help needed to walk in hospital room?: A Little Help needed climbing 3-5 steps with a railing? : A Lot 6 Click Score: 16    End of Session Equipment Utilized During Treatment:  Gait belt Activity Tolerance: Patient tolerated treatment well Patient left: in chair;with call bell/phone within reach Nurse Communication: Mobility status PT Visit Diagnosis: Other abnormalities of gait and mobility (R26.89);Muscle weakness (generalized) (M62.81)    Time: 3462-1947 PT Time Calculation (min) (ACUTE ONLY): 16 min   Charges:   PT Evaluation $PT Eval Moderate Complexity: Ewing M. Fairly IV, PT, DPT Physical Therapist- [facility]  01/06/2022, 3:06 PM

## 2022-01-06 NOTE — TOC Initial Note (Signed)
Transition of Care [facility]) - Initial/Assessment Note    Patient Details  Name: Adam Howell MRN: [id number] Date of Birth: [birth year]  [facility] CM/SW Contact:    Beverly Sessions, RN Phone Number: 01/06/2022, 2:50 PM  Clinical Narrative:                    Admitted YYT:KPTW ulcer of right lower leg, limited to breakdown of skin  Admitted from: home with daughter  SFK:[id number]  PT eval pending.  If home health recommended patient in agreement and states he does not have a preference of home health agency.   Patient would like RW at discharge  Patient states his daughter will transport at discharge Uses [facility] to get to and from HD      Patient Goals and CMS Choice        Expected Discharge Plan and Services                                                Prior Living Arrangements/Services                       Activities of Daily Living Home Assistive Devices/Equipment: None ADL Screening (condition at time of admission) Patient's cognitive ability adequate to safely complete daily activities?: Yes Is the patient deaf or have difficulty hearing?: No Does the patient have difficulty seeing, even when wearing glasses/contacts?: No Does the patient have difficulty concentrating, remembering, or making decisions?: No Patient able to express need for assistance with ADLs?: Yes Does the patient have difficulty dressing or bathing?: No Independently performs ADLs?: Yes (appropriate for developmental age) Does the patient have difficulty walking or climbing stairs?: No Weakness of Legs: None Weakness of Arms/Hands: None  Permission Sought/Granted                  Emotional Assessment              Admission diagnosis:  Diabetic leg ulcer ([place]) [G01.749, L97.909] Ulcer of right lower extremity, unspecified ulcer stage ([place]) [L97.919] Patient Active Problem List   Diagnosis Date Noted   Controlled  substance agreement signed 04/10/2021   Skin ulcer of right lower leg, limited to breakdown of skin ([place]) 02/02/2020   Other thrombophilia ([place]) 01/19/2020   Atrial fibrillation ([place]) 11/11/2019   Hyperlipidemia associated with type 2 diabetes mellitus ([place]) 11/11/2019   Erectile dysfunction 11/11/2019   Heart failure with reduced ejection fraction ([place]) 10/26/2019   Type 2 diabetes mellitus with ESRD (end-stage renal disease) ([place]) 10/04/2019   Chronic pain syndrome 10/04/2019   Insomnia 10/04/2019   Atherosclerotic heart disease of native coronary artery without angina pectoris 09/01/2019   Hypertensive heart and kidney disease with heart failure and chronic kidney disease stage V ([place]) 09/01/2019   Obesity 09/01/2019   Anemia in chronic kidney disease 07/25/2019   End stage renal disease ([place]) 07/25/2019   Secondary hyperparathyroidism of renal origin ([place]) 07/25/2019   PCP:  Venita Lick, NP Pharmacy:   [facility] [address] [place] [address] [place]) [place] [address] Phone: [phone] Fax: [phone]     Social Determinants of Health (SDOH) Interventions    Readmission Risk Interventions     No data to display

## 2022-01-07 DIAGNOSIS — N186 End stage renal disease: Secondary | ICD-10-CM

## 2022-01-07 DIAGNOSIS — G894 Chronic pain syndrome: Secondary | ICD-10-CM

## 2022-01-07 DIAGNOSIS — L97919 Non-pressure chronic ulcer of unspecified part of right lower leg with unspecified severity: Secondary | ICD-10-CM

## 2022-01-07 LAB — CULTURE, BLOOD (ROUTINE X 2)
Culture: NO GROWTH
Culture: NO GROWTH
Special Requests: ADEQUATE
Special Requests: ADEQUATE

## 2022-01-07 LAB — GLUCOSE, CAPILLARY
Glucose-Capillary: 78 mg/dL (ref 70–99)
Glucose-Capillary: 112 mg/dL — ABNORMAL HIGH (ref 70–99)

## 2022-01-07 MED ORDER — BUMETANIDE 1 MG PO TABS
1.0000 mg | ORAL_TABLET | Freq: Two times a day (BID) | ORAL | Status: DC
  Administered 2022-01-07: 1 mg via ORAL
  Filled 2022-01-07: qty 1

## 2022-01-07 NOTE — Progress Notes (Signed)
Pt completed 3.5 hour HD treatment w/ no complications. Alert, no c/o, vss, report to primary RN.  Start: 0836 End: 1213 3069ml fluid removed 64L BVP No HD meds ordered 122.5kg post HD bed weight

## 2022-01-07 NOTE — Progress Notes (Signed)
Post hd rn assessment 

## 2022-01-07 NOTE — TOC Transition Note (Signed)
Transition of Care [facility]) - CM/SW Discharge Note   Patient Details  Name: Adam Howell MRN: [id number] Date of Birth: 1960/05/27  Transition [facility]) CM/SW Contact:  Beverly Sessions, RN Phone Number: 01/07/2022, 2:10 PM   Clinical Narrative:      Patient to discharge today.  Referral made and accepted by Corene Cornea with [facility] with adapt notified of RW to be delivered prior to discharge  Daughter to transport at discharge        Patient Goals and CMS Choice        Discharge Placement                       Discharge Plan and Services                                     Social Determinants of Health (SDOH) Interventions     Readmission Risk Interventions     No data to display

## 2022-01-07 NOTE — Progress Notes (Signed)
[facility]  PROGRESS NOTE   Subjective:   Patient seen and evaluated during dialysis   HEMODIALYSIS FLOWSHEET:  Blood Flow Rate (mL/min): 400 mL/min Arterial Pressure (mmHg): -250 mmHg Venous Pressure (mmHg): 260 mmHg TMP (mmHg): -5 mmHg Ultrafiltration Rate (mL/min): 1052 mL/min Dialysate Flow Rate (mL/min): 300 ml/min Dialysis Fluid Bolus: Normal Saline  No complaints at this time   Objective:  Vital signs: Blood pressure 119/69, pulse 65, temperature 98.3 F (36.8 C), temperature source Oral, resp. rate 18, height 6\' 3"  (1.905 m), weight 121 kg, SpO2 95 %. No intake or output data in the 24 hours ending 01/07/22 1134  Filed Weights   01/02/22 1951 01/05/22 0916 01/05/22 1503  Weight: 114.4 kg 122 kg 121 kg     Physical Exam: General:  No acute distress  Head:  Normocephalic, atraumatic. Moist oral mucosal membranes  Eyes:  Anicteric  Lungs:   Clear to auscultation, normal effort  Heart:  S1S2 no rubs  Abdomen:   Soft, nontender, bowel sounds present  Extremities:  Trace peripheral edema.  Neurologic:  Awake, alert, following commands  Skin: Ulcer on the right leg  Access: Left aVF    Basic Metabolic Panel: Recent Labs  Lab 01/02/22 1015 01/03/22 0503 01/04/22 0447 01/05/22 1430  NA 135 135 134* 132*  K 4.7 3.7 4.1 4.0  CL 89* 93* 93* 92*  CO2 24 26 25 27   GLUCOSE 76 84 93 101*  BUN 58* 37* 48* 42*  CREATININE 13.92* 10.20* 12.31* 10.77*  CALCIUM 8.2* 7.9* 7.9* 7.7*  MG  --   --  2.3  --   PHOS  --   --   --  7.0*     CBC: Recent Labs  Lab 01/02/22 1015 01/03/22 0503 01/04/22 0447 01/05/22 0756  WBC 6.1 6.3 6.5 6.5  NEUTROABS 4.3  --   --   --   HGB 16.3 15.2 14.6 14.0  HCT 49.7 46.0 43.9 42.5  MCV 87.7 87.0 87.5 86.9  PLT 203 191 197 188      Urinalysis: No results for input(s): "COLORURINE", "LABSPEC", "PHURINE", "GLUCOSEU", "HGBUR", "BILIRUBINUR", "KETONESUR", "PROTEINUR", "UROBILINOGEN", "NITRITE", "LEUKOCYTESUR"  in the last 72 hours.  Invalid input(s): "APPERANCEUR"    Imaging: No results found.   Medications:    anticoagulant sodium citrate      amiodarone  200 mg Oral BID   apixaban  2.5 mg Oral BID   carvedilol  3.125 mg Oral BID   cephALEXin  250 mg Oral Q12H   Chlorhexidine Gluconate Cloth  6 each Topical Q0600   Chlorhexidine Gluconate Cloth  6 each Topical Q0600   gabapentin  300 mg Oral BID   insulin aspart  0-5 Units Subcutaneous QHS   insulin aspart  0-9 Units Subcutaneous TID WC   pregabalin  50 mg Oral Daily   rosuvastatin  40 mg Oral Daily   sertraline  50 mg Oral Daily   sevelamer carbonate  1,600 mg Oral TID WC   silver sulfADIAZINE   Topical Daily    Assessment/ Plan:     Principal Problem:   Skin ulcer of right lower leg, limited to breakdown of skin (HCC) Active Problems:   End stage renal disease ([facility])   Hypertensive heart and kidney disease with heart failure and chronic kidney disease stage V ([place])   Obesity   Secondary hyperparathyroidism of renal origin ([place])   Type 2 diabetes mellitus with ESRD (end-stage renal disease) (HCC)   Chronic pain syndrome  Insomnia   Atrial fibrillation (HCC)   Hyperlipidemia associated with type 2 diabetes mellitus ([place])  61 year old male with hypertension, neuropathy, atrial fibrillation on Eliquis and amiodarone, insulin-dependent diabetes mellitus, insomnia, who presents to the emergency department for chief concerns of worsening right lower extremity wound.   #1: ESRD: We will continue to maintain on Monday Wednesday Friday schedule.  Receiving dialysis today, UF goal 1.5-2L as tolerated.    #2: Second hyperparathyroidism: Calcium remains decreased. Will correct slowly in dialysis.   #3: Hypertension with chronic kidney disease: Was receiving nifedipine, losartan, isosorbide and carvedilol.  Blood pressure soft this morning, 92/46.  Blood pressure medicines adjusted, only receiving carvedilol and amiodarone.  Blood  pressure 117/73 during dialysis  #4: Congestive heart failure:  2D echo 01/10/2020-LVEF 35 to 40%, global hypokinesis, severe concentric LVH Continue Bumex.   LOS: [facility] 10/25/202311:34 AM

## 2022-01-07 NOTE — Progress Notes (Signed)
Adam Howell to be D/C'd Home with home health per MD order.  Discussed with the patient and all questions fully answered.  IV catheter discontinued intact. Site without signs and symptoms of complications. Dressing and pressure applied.  An After Visit Summary was printed and given to the patient. Patient prescriptions sent to pharmacy. Rolling walker delivered to patient room.  D/c education completed with patient/family including follow up instructions, medication list, d/c activities limitations if indicated, with other d/c instructions as indicated by MD - patient able to verbalize understanding, all questions fully answered.   Patient instructed to return to ED, call 911, or call MD for any changes in condition.   Patient escorted via [place], and D/C home via private auto.  [name] 01/07/2022 3:03 PM

## 2022-01-07 NOTE — Progress Notes (Signed)
Pre hd rn assessment 

## 2022-01-08 ENCOUNTER — Ambulatory Visit: Payer: Medicare Other | Admitting: Nurse Practitioner

## 2022-01-08 ENCOUNTER — Telehealth: Payer: Self-pay

## 2022-01-08 NOTE — Telephone Encounter (Signed)
Transition Care Management Unsuccessful Follow-up Telephone Call  Date of discharge and from where:  01/07/22, [facility]  Attempts:  1st Attempt  Reason for unsuccessful TCM follow-up call:  Left voice message

## 2022-01-09 NOTE — Telephone Encounter (Signed)
Transition Care Management Follow-up Telephone Call Date of discharge and from where: 01/07/22, [facility] How have you been since you were released from the hospital? Not good. My leg is still hurting.  Any questions or concerns? No  Items Reviewed: Did the pt receive and understand the discharge instructions provided? Yes  Medications obtained and verified? Yes  Other? No  Any new allergies since your discharge? No  Dietary orders reviewed? Yes Do you have support at home? Yes   Home Care and Equipment/Supplies: Were home health services ordered? no If so, what is the name of the agency? N/A  Has the agency set up a time to come to the patient's home? not applicable Were any new equipment or medical supplies ordered?  No What is the name of the medical supply agency? N/A Were you able to get the supplies/equipment? no Do you have any questions related to the use of the equipment or supplies? No  Functional Questionnaire: (I = Independent and D = Dependent) ADLs: I  Bathing/Dressing- I  Meal Prep- I  Eating- I  Maintaining continence- I  Transferring/Ambulation- I  Managing Meds- I  Follow up appointments reviewed:  PCP Hospital f/u appt confirmed? Yes  Scheduled to see [name], DNP on 01/14/22 @ 2:40 pm. [facility] f/u appt confirmed? Yes  Scheduled to see Vascular on 01/13/22 @ 1:00 pm. Are transportation arrangements needed? No  If their condition worsens, is the pt aware to call PCP or go to the Emergency Dept.? Yes Was the patient provided with contact information for the PCP's office or ED? Yes Was to pt encouraged to call back with questions or concerns? Yes

## 2022-01-09 NOTE — Discharge Summary (Signed)
Physician Discharge Summary   Patient: Adam Howell MRN: [id number] DOB: 10-Oct-1960  Admit date:     01/02/2022  Discharge date: 01/07/2022  Discharge Physician: Max Sane   PCP: Venita Lick, NP   Recommendations at discharge:   Follow-up with outpatient providers as requested  Discharge Diagnoses: Principal Problem:   Ulcer of right lower extremity ([place]) Active Problems:   End stage renal disease ([place])   Hypertensive heart and kidney disease with heart failure and chronic kidney disease stage V ([place])   Obesity   Secondary hyperparathyroidism of renal origin ([place])   Type 2 diabetes mellitus with ESRD (end-stage renal disease) ([place])   Chronic pain syndrome   Insomnia   Atrial fibrillation ([place])   Hyperlipidemia associated with type 2 diabetes mellitus [facility])  Hospital Course: [name] is a 61 year old male with hypertension, neuropathy, atrial fibrillation on Eliquis and amiodarone, insulin-dependent diabetes mellitus, insomnia, who presents to the emergency department for chief concerns of worsening right lower extremity wound.  The wound has been ongoing for approximately 1 week.  He was seen by outpatient PCP twice in the last week and was prescribed antibiotic which he has been compliant with.  The wound has worsened and cause severe pain causing him to miss hemodialysis session on day of admission.  Initial vitals in the emergency department showed temperature of 97.5, respiration rate of 16, heart rate of 81, blood pressure 133/82, SPO2 of 100% on room air.  Serum sodium is 135, potassium 4.7, chloride 89, bicarb 24, BUN of 58, serum creatinine of 13.92, EGFR 4, nonfasting blood glucose 76, WBC 6.1, hemoglobin 16.3, platelets of 203.  ED treatment: Dilaudid 0.5 mg IV one-time dose, ceftriaxone 2 g IV, vancomycin IV.  Assessment and Plan: * Ulcer of right lower extremity ([place]) --Full thickness wound with black eschar over the top, painful.  This  could be from infection, trauma or calciphylaxis. --treated with Abx while in the Hospital. He's requested to f/up with Dermatology at [facility] as an outpt  Hyperlipidemia associated with type 2 diabetes mellitus ([place]) Atrial fibrillation ([place]) --cont amiodarone, coreg --cont eliquis  Insomnia Type 2 diabetes mellitus with ESRD (end-stage renal disease) ([place]) Obesity BMI 31.52  Hypertensive heart and kidney disease with heart failure and chronic kidney disease stage V ([place]) End stage renal disease ([place]) on HD        Consultants: Nephrology Procedures performed: Dialysis while in the hospital Disposition: Home Diet recommendation:  Discharge Diet Orders (From admission, onward)     Start     Ordered   01/07/22 0000  Diet - low sodium heart healthy        01/07/22 1359           Carb modified diet DISCHARGE MEDICATION: Allergies as of 01/07/2022       Reactions   Tramadol Rash        Medication List     STOP taking these medications    doxycycline 100 MG tablet Commonly known as: VIBRA-TABS   furosemide 80 MG tablet Commonly known as: LASIX   Lantus SoloStar 100 UNIT/ML Solostar Pen Generic drug: insulin glargine   Santyl 250 UNIT/GM ointment Generic drug: collagenase       TAKE these medications    amiodarone 200 MG tablet Commonly known as: PACERONE Take 1 tablet (200 mg total) by mouth 2 (two) times daily.   Blood Pressure Monitor Automat Devi Use to check blood pressure twice daily.   bumetanide 1 MG tablet  Commonly known as: BUMEX Take 1 tablet (1 mg total) by mouth 2 (two) times daily.   carvedilol 3.125 MG tablet Commonly known as: COREG Take 1 tablet (3.125 mg total) by mouth 2 (two) times daily.   cephALEXin 250 MG capsule Commonly known as: KEFLEX Take 1 capsule (250 mg total) by mouth every 12 (twelve) hours for 7 days.   Eliquis 2.5 MG Tabs tablet Generic drug: apixaban Take 1 tablet (2.5 mg total) by mouth 2 (two) times  daily. Sig: Daily as presciribed.   gabapentin 300 MG capsule Commonly known as: NEURONTIN Take 1 capsule (300 mg total) by mouth 2 (two) times daily.   HYDROcodone-acetaminophen 10-325 MG tablet Commonly known as: NORCO Take 1 tablet by mouth every 8 (eight) hours as needed. What changed: when to take this   insulin aspart 100 UNIT/ML FlexPen Commonly known as: NOVOLOG Inject 3 Units into the skin 3 (three) times daily with meals. As needed only if blood sugar greater then 130 prior to meal.   Insulin Pen Needle 30G X 8 MM Misc Commonly known as: NOVOFINE Use to inject insulin daily   isosorbide mononitrate 30 MG 24 hr tablet Commonly known as: IMDUR Take 1 tablet (30 mg total) by mouth daily.   losartan 100 MG tablet Commonly known as: COZAAR Take 1 tablet (100 mg total) by mouth daily.   NIFEdipine 30 MG 24 hr tablet Commonly known as: PROCARDIA-XL/NIFEDICAL-XL Take 1 tablet (30 mg total) by mouth daily.   onetouch ultrasoft lancets Use to check blood sugar 4 times a day.   OneTouch Verio test strip Generic drug: glucose blood Use to check blood sugar 4 times a day.   OneTouch Verio w/Device Kit Use to check blood sugar 3 times a day and document results, bring to appointments.  Goal is <130 fasting blood sugar and <180 two hours after meals.   rosuvastatin 40 MG tablet Commonly known as: CRESTOR Take 1 tablet (40 mg total) by mouth daily.   sertraline 50 MG tablet Commonly known as: ZOLOFT Take 50 mg by mouth daily.   sevelamer carbonate 800 MG tablet Commonly known as: RENVELA Take 2 tablets (1,600 mg total) by mouth 3 (three) times daily.   silver sulfADIAZINE 1 % cream Commonly known as: SILVADENE Apply topically daily.   tiZANidine 2 MG tablet Commonly known as: ZANAFLEX Take 1 tablet (2 mg total) by mouth daily as needed for muscle spasms.   traZODone 50 MG tablet Commonly known as: DESYREL Take 1 tablet (50 mg total) by mouth at bedtime as  needed for sleep.   zolpidem 10 MG tablet Commonly known as: AMBIEN Take 1 tablet (10 mg total) by mouth at bedtime as needed.               Discharge Care Instructions  (From admission, onward)           Start     Ordered   01/07/22 0000  Discharge wound care:       Comments: As above   01/07/22 1359            Follow-up Information     Venita Lick, NP. Schedule an appointment as soon as possible for a visit in 2 day(s).   Specialty: Nurse Practitioner Why: [facility] Discharge F/UP Contact information: [place] [address] [phone]         Marie. Schedule an appointment as soon as possible for  a visit in 1 week(s).   Specialty: Wound Care Why: [facility] Discharge F/UP Contact information: [address] [phone]               Discharge Exam: Danley Danker Weights   01/05/22 3094 01/05/22 1503 01/07/22 1225  Weight: 122 kg 121 kg 122.5 kg   GENERAL:  61 y.o.-year-old patient lying in the bed with no acute distress.  LUNGS: Normal breath sounds bilaterally, no wheezing CARDIOVASCULAR: S1, S2 normal. No murmurs,   ABDOMEN: Soft, nontender, nondistended. Bowel sounds present.  EXTREMITIES:Right distal tibial shin   NEUROLOGIC: nonfocal  patient is alert and awake SKIN: No obvious rash, lesion, or ulcer.   Condition at discharge: fair  The results of significant diagnostics from this hospitalization (including imaging, microbiology, ancillary and laboratory) are listed below for reference.   Imaging Studies: US ARTERIAL ABI (SCREENING LOWER EXTREMITY)  Result Date: 01/02/2022 CLINICAL DATA:  Limb ischemia, diabetic foot ulcer EXAM: NONINVASIVE PHYSIOLOGIC VASCULAR STUDY OF BILATERAL LOWER EXTREMITIES TECHNIQUE: Evaluation of both lower extremities were performed at rest, including calculation of ankle-brachial indices with single level Doppler,  pressure and pulse volume recording. COMPARISON:  None Available. FINDINGS: Right ABI:  1.02 Left ABI:  0.93 Right Lower Extremity:  Normal arterial waveforms at the ankle. Left Lower Extremity:  Normal arterial waveforms at the ankle. 1.0-1.4 Normal IMPRESSION: Normal resting ABIs Electronically Signed   By: Jerilynn Mages.  Shick M.D.   On: 01/02/2022 15:08   DG Tibia/Fibula Right  Result Date: 01/02/2022 CLINICAL DATA:  Nonhealing ulcer EXAM: RIGHT TIBIA AND FIBULA - 2 VIEW COMPARISON:  None Available. FINDINGS: No fracture or dislocation is seen. There are no focal lytic lesions. There is no effusion in the right knee. Fairly extensive arterial calcifications are seen in soft tissues. There is edema in subcutaneous plane. Plantar spur is seen in calcaneus. IMPRESSION: No fracture or dislocation is seen. There are no focal lytic lesions. Electronically Signed   By: Elmer Picker M.D.   On: 01/02/2022 13:00    Microbiology: Results for orders placed or performed during the hospital encounter of 01/02/22  Blood culture (routine x 2)     Status: None   Collection Time: 01/02/22 11:39 AM   Specimen: BLOOD RIGHT ARM  Result Value Ref Range Status   Specimen Description BLOOD RIGHT ARM  Final   Special Requests   Final    BOTTLES DRAWN AEROBIC AND ANAEROBIC Blood Culture adequate volume   Culture   Final    NO GROWTH 5 DAYS Performed at [facility], [address] [place], [place] [address]    Report Status 01/07/2022 FINAL  Final  Blood culture (routine x 2)     Status: None   Collection Time: 01/02/22  1:09 PM   Specimen: BLOOD RIGHT HAND  Result Value Ref Range Status   Specimen Description BLOOD RIGHT HAND  Final   Special Requests   Final    BOTTLES DRAWN AEROBIC AND ANAEROBIC Blood Culture adequate volume   Culture   Final    NO GROWTH 5 DAYS Performed at [facility], [address] [place], [place] [address]    Report Status 01/07/2022 FINAL  Final  Aerobic  Culture w Gram Stain (superficial specimen)     Status: None   Collection Time: 01/02/22  5:52 PM   Specimen: Leg  Result Value Ref Range Status   Specimen Description   Final    LEG Performed at [facility], [address], [place], [place] [address]    Special Requests   Final    NONE Performed at [facility], [address] [place], [place] [address]    Gram Stain   Final    NO WBC SEEN FEW GRAM NEGATIVE RODS FEW GRAM POSITIVE COCCI IN PAIRS Performed at [facility], [place] [address] [place], [place] [address]    Culture   Final    ABUNDANT PROTEUS MIRABILIS FEW KLEBSIELLA OXYTOCA    Report Status 01/08/2022 FINAL  Final   Organism ID, Bacteria PROTEUS MIRABILIS  Final   Organism ID, Bacteria KLEBSIELLA OXYTOCA  Final      Susceptibility   Proteus mirabilis - MIC*    AMPICILLIN <=2 SENSITIVE Sensitive     CEFAZOLIN 8 SENSITIVE Sensitive     CEFEPIME <=0.12 SENSITIVE Sensitive     CEFTAZIDIME <=1 SENSITIVE Sensitive     CEFTRIAXONE <=0.25 SENSITIVE Sensitive     CIPROFLOXACIN <=0.25 SENSITIVE Sensitive     GENTAMICIN <=1 SENSITIVE Sensitive     IMIPENEM 8 INTERMEDIATE Intermediate     TRIMETH/SULFA <=20 SENSITIVE Sensitive     AMPICILLIN/SULBACTAM <=2 SENSITIVE Sensitive     PIP/TAZO <=4 SENSITIVE Sensitive     * ABUNDANT PROTEUS MIRABILIS    Labs: CBC: Recent Labs  Lab 01/03/22 0503 01/04/22 0447 01/05/22 0756  WBC 6.3 6.5 6.5  HGB 15.2 14.6 14.0  HCT 46.0 43.9 42.5  MCV 87.0 87.5 86.9  PLT 191 197 007   Basic Metabolic Panel: Recent Labs  Lab 01/03/22 0503 01/04/22 0447 01/05/22 1430  NA 135 134* 132*  K 3.7 4.1 4.0  CL 93* 93* 92*  CO2 _0 GLUCOSE 84 93 101*  BUN 37* 48* 42*  CREATININE 10.20* 12.31* 10.77*  CALCIUM 7.9* 7.9* 7.7*  MG  --  2.3  --   PHOS  --   --  7.0*   Liver Function Tests: Recent Labs  Lab 01/05/22 1430  ALBUMIN 2.9*   CBG: Recent Labs  Lab 01/06/22 1132 01/06/22 1712  01/06/22 2231 01/07/22 1258 01/07/22 1705  GLUCAP 103* 133* 112* 78 112*    Discharge time spent: greater than 30 minutes.  Signed: Max Sane, MD [facility] 01/09/2022

## 2022-01-12 ENCOUNTER — Other Ambulatory Visit (INDEPENDENT_AMBULATORY_CARE_PROVIDER_SITE_OTHER): Payer: Self-pay | Admitting: Nurse Practitioner

## 2022-01-12 ENCOUNTER — Other Ambulatory Visit: Payer: Self-pay

## 2022-01-12 DIAGNOSIS — Z794 Long term (current) use of insulin: Secondary | ICD-10-CM

## 2022-01-12 DIAGNOSIS — L97911 Non-pressure chronic ulcer of unspecified part of right lower leg limited to breakdown of skin: Secondary | ICD-10-CM

## 2022-01-12 DIAGNOSIS — I4891 Unspecified atrial fibrillation: Secondary | ICD-10-CM | POA: Diagnosis present

## 2022-01-12 DIAGNOSIS — Z7901 Long term (current) use of anticoagulants: Secondary | ICD-10-CM

## 2022-01-12 DIAGNOSIS — Z79899 Other long term (current) drug therapy: Secondary | ICD-10-CM

## 2022-01-12 DIAGNOSIS — N186 End stage renal disease: Secondary | ICD-10-CM | POA: Diagnosis present

## 2022-01-12 DIAGNOSIS — E11622 Type 2 diabetes mellitus with other skin ulcer: Principal | ICD-10-CM | POA: Diagnosis present

## 2022-01-12 DIAGNOSIS — Z8249 Family history of ischemic heart disease and other diseases of the circulatory system: Secondary | ICD-10-CM

## 2022-01-12 DIAGNOSIS — I120 Hypertensive chronic kidney disease with stage 5 chronic kidney disease or end stage renal disease: Secondary | ICD-10-CM | POA: Diagnosis present

## 2022-01-12 DIAGNOSIS — Z992 Dependence on renal dialysis: Secondary | ICD-10-CM

## 2022-01-12 DIAGNOSIS — L97919 Non-pressure chronic ulcer of unspecified part of right lower leg with unspecified severity: Secondary | ICD-10-CM | POA: Diagnosis present

## 2022-01-12 DIAGNOSIS — T148XXA Other injury of unspecified body region, initial encounter: Secondary | ICD-10-CM | POA: Diagnosis not present

## 2022-01-12 DIAGNOSIS — G894 Chronic pain syndrome: Secondary | ICD-10-CM | POA: Diagnosis present

## 2022-01-12 DIAGNOSIS — Z888 Allergy status to other drugs, medicaments and biological substances status: Secondary | ICD-10-CM

## 2022-01-12 DIAGNOSIS — E785 Hyperlipidemia, unspecified: Secondary | ICD-10-CM | POA: Diagnosis present

## 2022-01-12 DIAGNOSIS — E1122 Type 2 diabetes mellitus with diabetic chronic kidney disease: Secondary | ICD-10-CM | POA: Diagnosis present

## 2022-01-12 DIAGNOSIS — G4700 Insomnia, unspecified: Secondary | ICD-10-CM | POA: Diagnosis present

## 2022-01-12 DIAGNOSIS — L03115 Cellulitis of right lower limb: Secondary | ICD-10-CM | POA: Diagnosis present

## 2022-01-12 DIAGNOSIS — N2581 Secondary hyperparathyroidism of renal origin: Secondary | ICD-10-CM | POA: Diagnosis present

## 2022-01-12 LAB — CBC WITH DIFFERENTIAL/PLATELET
Abs Immature Granulocytes: 0.02 10*3/uL (ref 0.00–0.07)
Basophils Absolute: 0.1 10*3/uL (ref 0.0–0.1)
Basophils Relative: 1 %
Eosinophils Absolute: 0.3 10*3/uL (ref 0.0–0.5)
Eosinophils Relative: 5 %
HCT: 47.4 % (ref 39.0–52.0)
Hemoglobin: 15.8 g/dL (ref 13.0–17.0)
Immature Granulocytes: 0 %
Lymphocytes Relative: 17 %
Lymphs Abs: 1 10*3/uL (ref 0.7–4.0)
MCH: 28.6 pg (ref 26.0–34.0)
MCHC: 33.3 g/dL (ref 30.0–36.0)
MCV: 85.9 fL (ref 80.0–100.0)
Monocytes Absolute: 0.6 10*3/uL (ref 0.1–1.0)
Monocytes Relative: 10 %
Neutro Abs: 3.8 10*3/uL (ref 1.7–7.7)
Neutrophils Relative %: 67 %
Platelets: 235 10*3/uL (ref 150–400)
RBC: 5.52 MIL/uL (ref 4.22–5.81)
RDW: 15.1 % (ref 11.5–15.5)
WBC: 5.7 10*3/uL (ref 4.0–10.5)
nRBC: 0 % (ref 0.0–0.2)

## 2022-01-12 LAB — COMPREHENSIVE METABOLIC PANEL
ALT: 14 U/L (ref 0–44)
AST: 20 U/L (ref 15–41)
Albumin: 3.7 g/dL (ref 3.5–5.0)
Alkaline Phosphatase: 72 U/L (ref 38–126)
Anion gap: 15 (ref 5–15)
BUN: 46 mg/dL — ABNORMAL HIGH (ref 6–20)
CO2: 28 mmol/L (ref 22–32)
Calcium: 9 mg/dL (ref 8.9–10.3)
Chloride: 91 mmol/L — ABNORMAL LOW (ref 98–111)
Creatinine, Ser: 10.5 mg/dL — ABNORMAL HIGH (ref 0.61–1.24)
GFR, Estimated: 5 mL/min — ABNORMAL LOW (ref >60)
Glucose, Bld: 80 mg/dL (ref 70–99)
Potassium: 5.1 mmol/L (ref 3.5–5.1)
Sodium: 134 mmol/L — ABNORMAL LOW (ref 135–145)
Total Bilirubin: 0.9 mg/dL (ref 0.3–1.2)
Total Protein: 8.9 g/dL — ABNORMAL HIGH (ref 6.5–8.1)

## 2022-01-12 LAB — LACTIC ACID, PLASMA: Lactic Acid, Venous: 0.8 mmol/L (ref 0.5–1.9)

## 2022-01-12 NOTE — ED Triage Notes (Addendum)
Pt BIB EMS for leg pain from his diabetic wound. Per pt, he has had wound for about 2 weeks and the pain has gotten worse. Per pt, the wound has drainage and has an odor.   162/98 80 100% CBG 96

## 2022-01-12 NOTE — Patient Instructions (Incomplete)
Pressure Injury  A pressure injury, also called a pressure ulcer or bedsore, is an injury to skin and the tissue under the skin that is caused by pressure. It often affects people who must spend a long time in a bed or chair because of a medical condition. Pressure injuries often occur: Over bony parts of the body, such as the tailbone, shoulders, elbows, hips, heels, spine, ankles, and back of the head. Under medical devices that touch the body. These include stockings, equipment to help with breathing, tubes, and splints. Inside the mouth or nose from dentures or tubes. Pressure injuries start as red areas on the skin and can lead to pain and an open wound. What are the causes? This condition is caused by frequent or constant pressure to an area of the body. Less blood flow to the skin can make the tissue die and break down over time, causing a wound. What increases the risk? You are more likely to develop this condition if: You are in the hospital or an extended care facility. You are bedridden or in a wheelchair. You have an injury or disease that keeps you from moving well and feeling pain or pressure. You have a condition that: Makes you sleepy or less alert. Causes poor blood flow. You need to wear a medical device. You have poor control of your bladder or bowel movements (incontinence). You are not getting enough fluid or nutrients (malnutrition). Your health care provider may recommend certain types of mattresses, mattress covers, pillows, cushions, or boots to help prevent a pressure injury. These may include products filled with air, foam, gel, or sand. What are the signs or symptoms? Symptoms of this condition depend on how severe your injury is. Symptoms may include: Red or dark areas of the skin. Pain or a change in skin texture. Your skin may feel warmer, cooler, softer, or firmer. Blisters. An open wound. How is this diagnosed? This condition is diagnosed based on a  medical history and physical exam. You may also have tests, such as: Blood tests. Imaging tests. Blood flow tests. Your injury will be staged based on how severe it is. Staging is based on: How deep the tissue injury is. This includes whether muscle, bone, tendon, or dead tissue is exposed. The cause of the injury. How is this treated? This condition may be treated by: Reducing pressure on your skin. You may need to: Change your position often. Avoid positions that caused the wound or that may make the wound worse. Use certain mattresses, overlays, chair cushions, or protective boots. Move medical devices from an area of pressure, or place padding between the skin and the device. Use foams, creams, or powders to protect your skin from sweat, urine, and stool and reduce rubbing (friction) on the skin. Keeping your skin clean and dry. This may include using a skin cleanser or barrier as told by your health care provider. Cleaning your injury and getting rid of any dead tissue from the wound (debridement). Placing a protective medicine, such as a cream, or bandage (dressing) over your injury. Using medicines for pain or to prevent or treat infection. Surgery may be needed if other treatments are not working or if your injury is very deep. Follow these instructions at home: Medicines Take over-the-counter and prescription medicines only as told by your health care provider. If you were prescribed antibiotics, take or apply them as told by your health care provider. Do not stop using the antibiotic even if you start to   feel better. Eating and drinking Drink enough fluid to keep your urine pale yellow. Eat a healthy diet with lots of protein, as told by your health care provider. Do not use drugs or drink alcohol. Wound care Follow instructions from your health care provider about how to take care of your wound. Make sure you: Wash your hands with soap and water before and after you change  your dressing or apply medicine to your skin. If soap and water are not available, use hand sanitizer. Change your dressing as told by your health care provider. Check your wound every day for signs of infection. Have a caregiver do this for you if you are not able. Check for: Redness, swelling, or more pain. More fluid or blood. Warmth. Pus or a bad smell. Skin care Keep your skin clean and dry. Gently pat your skin dry. Do not rub or massage your skin. Check your skin every day for any changes in color or any new blisters or sores (ulcers). Reducing pressure Do not lie or sit in one position for a long time. Move or change position every 1-2 hours, or as told by your health care provider. Use pillows or cushions to reduce pressure. Ask your health care provider what cushions or pads you should use. General instructions Do not use any products that contain nicotine or tobacco. These products include cigarettes, chewing tobacco, and vaping devices, such as e-cigarettes. If you need help quitting, ask your health care provider. Try to be active every day. Ask your health care provider what exercises or activities are safe for you. Keep all follow-up visits. Your health care provider will check if your injury is healing. Contact a health care provider if: You have a fever or chills. You have pain that does not get better with medicine. Your skin changes color. You have new blisters or sores. You have signs of infection. Your wound does not get better after 1-2 weeks of treatment. This information is not intended to replace advice given to you by your health care provider. Make sure you discuss any questions you have with your health care provider. Document Revised: 08/26/2021 Document Reviewed: 08/01/2021 Elsevier Patient Education  2023 Elsevier Inc.  

## 2022-01-13 ENCOUNTER — Encounter (INDEPENDENT_AMBULATORY_CARE_PROVIDER_SITE_OTHER): Payer: Medicare Other | Admitting: Nurse Practitioner

## 2022-01-13 ENCOUNTER — Encounter (INDEPENDENT_AMBULATORY_CARE_PROVIDER_SITE_OTHER): Payer: Medicare Other

## 2022-01-13 ENCOUNTER — Telehealth: Payer: Self-pay | Admitting: Nurse Practitioner

## 2022-01-13 ENCOUNTER — Encounter: Payer: Self-pay | Admitting: Internal Medicine

## 2022-01-13 ENCOUNTER — Inpatient Hospital Stay: Payer: Medicare Other

## 2022-01-13 ENCOUNTER — Inpatient Hospital Stay
Admission: EM | Admit: 2022-01-13 | Discharge: 2022-01-19 | DRG: 623 | Disposition: A | Discharge status: Home Health | Payer: Medicare Other | Attending: Osteopathic Medicine | Admitting: Osteopathic Medicine

## 2022-01-13 DIAGNOSIS — L089 Local infection of the skin and subcutaneous tissue, unspecified: Secondary | ICD-10-CM | POA: Insufficient documentation

## 2022-01-13 DIAGNOSIS — E11622 Type 2 diabetes mellitus with other skin ulcer: Secondary | ICD-10-CM | POA: Diagnosis present

## 2022-01-13 DIAGNOSIS — I5020 Unspecified systolic (congestive) heart failure: Secondary | ICD-10-CM | POA: Diagnosis present

## 2022-01-13 DIAGNOSIS — T148XXA Other injury of unspecified body region, initial encounter: Secondary | ICD-10-CM | POA: Diagnosis present

## 2022-01-13 DIAGNOSIS — G894 Chronic pain syndrome: Secondary | ICD-10-CM | POA: Diagnosis present

## 2022-01-13 DIAGNOSIS — Z8249 Family history of ischemic heart disease and other diseases of the circulatory system: Secondary | ICD-10-CM | POA: Diagnosis not present

## 2022-01-13 DIAGNOSIS — I120 Hypertensive chronic kidney disease with stage 5 chronic kidney disease or end stage renal disease: Secondary | ICD-10-CM | POA: Diagnosis present

## 2022-01-13 DIAGNOSIS — N186 End stage renal disease: Secondary | ICD-10-CM | POA: Diagnosis present

## 2022-01-13 DIAGNOSIS — Z888 Allergy status to other drugs, medicaments and biological substances status: Secondary | ICD-10-CM | POA: Diagnosis not present

## 2022-01-13 DIAGNOSIS — E1122 Type 2 diabetes mellitus with diabetic chronic kidney disease: Secondary | ICD-10-CM | POA: Diagnosis present

## 2022-01-13 DIAGNOSIS — I4891 Unspecified atrial fibrillation: Secondary | ICD-10-CM | POA: Diagnosis present

## 2022-01-13 DIAGNOSIS — I96 Gangrene, not elsewhere classified: Secondary | ICD-10-CM | POA: Diagnosis not present

## 2022-01-13 DIAGNOSIS — I4811 Longstanding persistent atrial fibrillation: Secondary | ICD-10-CM

## 2022-01-13 DIAGNOSIS — G4700 Insomnia, unspecified: Secondary | ICD-10-CM | POA: Diagnosis present

## 2022-01-13 DIAGNOSIS — N2581 Secondary hyperparathyroidism of renal origin: Secondary | ICD-10-CM | POA: Diagnosis present

## 2022-01-13 DIAGNOSIS — L97919 Non-pressure chronic ulcer of unspecified part of right lower leg with unspecified severity: Secondary | ICD-10-CM | POA: Diagnosis present

## 2022-01-13 DIAGNOSIS — Z794 Long term (current) use of insulin: Secondary | ICD-10-CM | POA: Diagnosis not present

## 2022-01-13 DIAGNOSIS — Z7901 Long term (current) use of anticoagulants: Secondary | ICD-10-CM | POA: Diagnosis not present

## 2022-01-13 DIAGNOSIS — L97912 Non-pressure chronic ulcer of unspecified part of right lower leg with fat layer exposed: Secondary | ICD-10-CM | POA: Diagnosis not present

## 2022-01-13 DIAGNOSIS — I10 Essential (primary) hypertension: Secondary | ICD-10-CM | POA: Diagnosis not present

## 2022-01-13 DIAGNOSIS — L03115 Cellulitis of right lower limb: Secondary | ICD-10-CM | POA: Diagnosis present

## 2022-01-13 DIAGNOSIS — Z79899 Other long term (current) drug therapy: Secondary | ICD-10-CM | POA: Diagnosis not present

## 2022-01-13 DIAGNOSIS — L97818 Non-pressure chronic ulcer of other part of right lower leg with other specified severity: Secondary | ICD-10-CM | POA: Diagnosis not present

## 2022-01-13 DIAGNOSIS — E785 Hyperlipidemia, unspecified: Secondary | ICD-10-CM | POA: Diagnosis present

## 2022-01-13 DIAGNOSIS — I482 Chronic atrial fibrillation, unspecified: Secondary | ICD-10-CM | POA: Diagnosis present

## 2022-01-13 DIAGNOSIS — Z992 Dependence on renal dialysis: Secondary | ICD-10-CM | POA: Diagnosis not present

## 2022-01-13 LAB — GLUCOSE, CAPILLARY
Glucose-Capillary: 92 mg/dL (ref 70–99)
Glucose-Capillary: 121 mg/dL — ABNORMAL HIGH (ref 70–99)

## 2022-01-13 LAB — CBG MONITORING, ED: Glucose-Capillary: 114 mg/dL — ABNORMAL HIGH (ref 70–99)

## 2022-01-13 MED ORDER — HYDROCODONE-ACETAMINOPHEN 10-325 MG PO TABS
1.0000 | ORAL_TABLET | Freq: Three times a day (TID) | ORAL | Status: DC | PRN
  Administered 2022-01-13 – 2022-01-14 (×3): 1 via ORAL
  Filled 2022-01-13 (×3): qty 1

## 2022-01-13 MED ORDER — NIFEDIPINE ER OSMOTIC RELEASE 30 MG PO TB24
30.0000 mg | ORAL_TABLET | Freq: Every day | ORAL | Status: DC
  Administered 2022-01-13 – 2022-01-18 (×4): 30 mg via ORAL
  Filled 2022-01-13 (×7): qty 1

## 2022-01-13 MED ORDER — SODIUM CHLORIDE 0.9 % IV SOLN
2.0000 g | INTRAVENOUS | Status: DC
  Administered 2022-01-14 – 2022-01-16 (×3): 2 g via INTRAVENOUS
  Filled 2022-01-13 (×2): qty 20
  Filled 2022-01-13: qty 2

## 2022-01-13 MED ORDER — SERTRALINE HCL 50 MG PO TABS
50.0000 mg | ORAL_TABLET | Freq: Every day | ORAL | Status: DC
  Administered 2022-01-13 – 2022-01-18 (×5): 50 mg via ORAL
  Filled 2022-01-13 (×5): qty 1

## 2022-01-13 MED ORDER — ROSUVASTATIN CALCIUM 10 MG PO TABS
40.0000 mg | ORAL_TABLET | Freq: Every day | ORAL | Status: DC
  Administered 2022-01-13 – 2022-01-18 (×5): 40 mg via ORAL
  Filled 2022-01-13: qty 4
  Filled 2022-01-13: qty 2
  Filled 2022-01-13 (×3): qty 4

## 2022-01-13 MED ORDER — VANCOMYCIN HCL 2000 MG/400ML IV SOLN
2000.0000 mg | Freq: Once | INTRAVENOUS | Status: AC
  Administered 2022-01-13: 2000 mg via INTRAVENOUS
  Filled 2022-01-13: qty 400

## 2022-01-13 MED ORDER — PENTAFLUOROPROP-TETRAFLUOROETH EX AERO: 1.0000 | INHALATION_SPRAY | CUTANEOUS | Status: DC | PRN

## 2022-01-13 MED ORDER — BUMETANIDE 1 MG PO TABS
1.0000 mg | ORAL_TABLET | Freq: Two times a day (BID) | ORAL | Status: DC
  Administered 2022-01-13 – 2022-01-18 (×10): 1 mg via ORAL
  Filled 2022-01-13 (×13): qty 1

## 2022-01-13 MED ORDER — SEVELAMER CARBONATE 800 MG PO TABS
1600.0000 mg | ORAL_TABLET | Freq: Three times a day (TID) | ORAL | Status: DC
  Administered 2022-01-13 – 2022-01-18 (×13): 1600 mg via ORAL
  Filled 2022-01-13 (×14): qty 2

## 2022-01-13 MED ORDER — ISOSORBIDE MONONITRATE ER 30 MG PO TB24
30.0000 mg | ORAL_TABLET | Freq: Every day | ORAL | Status: DC
  Administered 2022-01-13 – 2022-01-18 (×4): 30 mg via ORAL
  Filled 2022-01-13 (×5): qty 1

## 2022-01-13 MED ORDER — AMIODARONE HCL 200 MG PO TABS
200.0000 mg | ORAL_TABLET | Freq: Two times a day (BID) | ORAL | Status: DC
  Administered 2022-01-13 – 2022-01-18 (×10): 200 mg via ORAL
  Filled 2022-01-13 (×11): qty 1

## 2022-01-13 MED ORDER — TIZANIDINE HCL 4 MG PO TABS
2.0000 mg | ORAL_TABLET | Freq: Every day | ORAL | Status: DC | PRN
  Administered 2022-01-17 – 2022-01-18 (×2): 2 mg via ORAL
  Filled 2022-01-13 (×2): qty 1

## 2022-01-13 MED ORDER — LIDOCAINE-PRILOCAINE 2.5-2.5 % EX CREA: 1.0000 | TOPICAL_CREAM | CUTANEOUS | Status: DC | PRN

## 2022-01-13 MED ORDER — HYDROCODONE-ACETAMINOPHEN 5-325 MG PO TABS
1.0000 | ORAL_TABLET | Freq: Once | ORAL | Status: AC
  Administered 2022-01-13: 1 via ORAL
  Filled 2022-01-13: qty 1

## 2022-01-13 MED ORDER — ANTICOAGULANT SODIUM CITRATE 4% (200MG/5ML) IV SOLN: 5.0000 mL | Status: DC | PRN

## 2022-01-13 MED ORDER — HEPARIN SODIUM (PORCINE) 1000 UNIT/ML DIALYSIS: 1000.0000 [IU] | INTRAMUSCULAR | Status: DC | PRN

## 2022-01-13 MED ORDER — ONDANSETRON HCL 4 MG/2ML IJ SOLN
4.0000 mg | Freq: Once | INTRAMUSCULAR | Status: AC
  Administered 2022-01-13: 4 mg via INTRAVENOUS
  Filled 2022-01-13: qty 2

## 2022-01-13 MED ORDER — SILVER SULFADIAZINE 1 % EX CREA
1.0000 | TOPICAL_CREAM | Freq: Every day | CUTANEOUS | Status: DC
  Administered 2022-01-13 – 2022-01-15 (×2): 1 via TOPICAL
  Filled 2022-01-13: qty 85

## 2022-01-13 MED ORDER — CHLORHEXIDINE GLUCONATE CLOTH 2 % EX PADS
6.0000 | MEDICATED_PAD | Freq: Every day | CUTANEOUS | Status: DC
  Administered 2022-01-14 – 2022-01-16 (×2): 6 via TOPICAL

## 2022-01-13 MED ORDER — GABAPENTIN 300 MG PO CAPS
300.0000 mg | ORAL_CAPSULE | Freq: Two times a day (BID) | ORAL | Status: DC
  Administered 2022-01-13 – 2022-01-18 (×10): 300 mg via ORAL
  Filled 2022-01-13 (×10): qty 1

## 2022-01-13 MED ORDER — ONDANSETRON HCL 4 MG/2ML IJ SOLN: 4.0000 mg | Freq: Four times a day (QID) | INTRAMUSCULAR | Status: DC | PRN

## 2022-01-13 MED ORDER — ALTEPLASE 2 MG IJ SOLR: 2.0000 mg | Freq: Once | INTRAMUSCULAR | Status: DC | PRN

## 2022-01-13 MED ORDER — LIDOCAINE HCL (PF) 1 % IJ SOLN: 5.0000 mL | INTRAMUSCULAR | Status: DC | PRN

## 2022-01-13 MED ORDER — ZOLPIDEM TARTRATE 5 MG PO TABS: 10.0000 mg | ORAL_TABLET | Freq: Every evening | ORAL | Status: DC | PRN

## 2022-01-13 MED ORDER — ONDANSETRON HCL 4 MG PO TABS: 4.0000 mg | ORAL_TABLET | Freq: Four times a day (QID) | ORAL | Status: DC | PRN

## 2022-01-13 MED ORDER — VANCOMYCIN HCL IN DEXTROSE 1-5 GM/200ML-% IV SOLN
1000.0000 mg | INTRAVENOUS | Status: DC
  Administered 2022-01-14: 1000 mg via INTRAVENOUS
  Filled 2022-01-13 (×3): qty 200

## 2022-01-13 MED ORDER — APIXABAN 2.5 MG PO TABS
2.5000 mg | ORAL_TABLET | Freq: Two times a day (BID) | ORAL | Status: DC
  Administered 2022-01-13 – 2022-01-15 (×6): 2.5 mg via ORAL
  Filled 2022-01-13 (×7): qty 1

## 2022-01-13 MED ORDER — CARVEDILOL 3.125 MG PO TABS
3.1250 mg | ORAL_TABLET | Freq: Two times a day (BID) | ORAL | Status: DC
  Administered 2022-01-13 – 2022-01-18 (×7): 3.125 mg via ORAL
  Filled 2022-01-13 (×11): qty 1

## 2022-01-13 MED ORDER — LOSARTAN POTASSIUM 50 MG PO TABS
100.0000 mg | ORAL_TABLET | Freq: Every day | ORAL | Status: DC
  Administered 2022-01-13 – 2022-01-18 (×4): 100 mg via ORAL
  Filled 2022-01-13 (×5): qty 2

## 2022-01-13 MED ORDER — TRAZODONE HCL 50 MG PO TABS
50.0000 mg | ORAL_TABLET | Freq: Every evening | ORAL | Status: DC | PRN
  Administered 2022-01-15 – 2022-01-19 (×2): 50 mg via ORAL
  Filled 2022-01-13 (×3): qty 1

## 2022-01-13 MED ORDER — FENTANYL CITRATE PF 50 MCG/ML IJ SOSY
50.0000 ug | PREFILLED_SYRINGE | Freq: Once | INTRAMUSCULAR | Status: AC
  Administered 2022-01-13: 50 ug via INTRAVENOUS
  Filled 2022-01-13: qty 1

## 2022-01-13 MED ORDER — SODIUM CHLORIDE 0.9 % IV SOLN
2.0000 g | Freq: Once | INTRAVENOUS | Status: AC
  Administered 2022-01-13: 2 g via INTRAVENOUS
  Filled 2022-01-13: qty 20

## 2022-01-13 NOTE — Telephone Encounter (Signed)
Copied from [place] [id number]. Topic: Quick Communication - Home Health Verbal Orders >> Jan 13, 2022  9:42 AM Everette C wrote: Caller/Agency: Jonelle Sidle / [facility]  Callback Number: [phone] option 2  Requesting OT/PT/Skilled Nursing/Social Work/Speech Therapy: Skilled Nursing  Frequency: 2w8 1w1

## 2022-01-13 NOTE — ED Notes (Signed)
Patient requesting pain meds - text paged MD. Awaiting orders.

## 2022-01-13 NOTE — Telephone Encounter (Signed)
Spoke with Tiffany and provided verbal OK for orders. Tiffany was made aware that patient is possibly hospitalized and tiffany said she will informed their hospital staff and verbalized understanding and has no further questions.

## 2022-01-13 NOTE — Consult Note (Signed)
[place] Nurse Consult Note: Reason for Consult:Right LE pretibial full thickness wound. Last seen by this writer on 01/03/22. See note from that Encounter. Wound type: Infectious vs trauma vs autoimmune (calciphylaxis) Pressure Injury POA: N/A Measurement:Measured last on 01/03/22: 3.4cm x 2.8cm with depth unable to be determined due to the presence of nonviable tissue in wound bed. Wound bed:As noted above and as documented in photo taken in ED by EDP and uploaded to the medical record. Drainage (amount, consistency, odor)  Periwound:Small, yellow Dressing procedure/placement/frequency: I will continue the POC implemented at time of last visit, specifically, to cleanse the wound with NS, and apply silver sulfadiazine cream (Silvadene) in a 1/8 inch layer. This is to be topped with saline moistened gauze, then dry gauze and covered with an ABD pad for comfort/cushion. The dressing is to be secured with a few turns of Kerlix roll gauze/paper tape. Heels ar to be floated and placement of a sacral prophylactic foam is recommended. I provided the patient with a pressure redistribution chair cushion on his past admission, so will not reorder another today.  Recommend consultation with Nephrology/ID/Dermatology as desired for a definitive diagnosis and comprehensive POC. If you agree, please order/arrange.  [facility] nursing team will not follow, but will remain available to this patient, the nursing and medical teams.  Please re-consult if needed.  Thank you for inviting [place] to participate in this patient's Plan of Care.  Maudie Flakes, MSN, RN, CNS, [place], Serita Grammes, [employer], [employer] phone:  [phone]

## 2022-01-13 NOTE — Progress Notes (Signed)
[facility]  PROGRESS NOTE   Subjective:   Adam Howell is a 61 year old male with past medical conditions including diabetes, hyperlipidemia, hypertension, CHF, and end-stage renal disease on hemodialysis.  Patient presents to the emergency department with leg pain and will be admitted for Wound infection [T14.8XXA, L08.9]  Patient is known to our practice from previous admissions and receives outpatient dialysis treatments at [facility] on a MWF schedule, supervised by [facility] physicians.  Last dialysis treatment completed yesterday prior to ED arrival.  Patient was recently admitted for a right lower extremity wound and received IV antibiotics at that time.  Patient reports progressive pain and swelling in right lower extremity since discharge.  States he completed all prescribed antibiotics and feels wound has not improved.  Denies known fever or chills.  Denies chest pain or shortness of breath.  Labs on ED arrival include sodium 134, potassium 5.1, BUN 46, creatinine 10.5 with GFR 5.  Culture obtained from wound, pending.  Right lower extremity negative for DVT.  We have been consulted to manage dialysis needs during this admission.   Objective:  Vital signs: Blood pressure 112/66, pulse 79, temperature 98.5 F (36.9 C), temperature source Oral, resp. rate 20, weight 117.9 kg, SpO2 95 %. No intake or output data in the 24 hours ending 01/13/22 1434  Filed Weights   01/12/22 2144  Weight: 117.9 kg     Physical Exam: General:  No acute distress  Head:  Normocephalic, atraumatic. Moist oral mucosal membranes  Eyes:  Anicteric  Lungs:   Clear to auscultation, normal effort  Heart:  S1S2 no rubs  Abdomen:   Soft, nontender, bowel sounds present  Extremities: 1-2+ peripheral edema.  Neurologic:  Awake, alert, following commands  Skin: Ulcer on the right leg  Access: Left aVF    Basic Metabolic Panel: Recent Labs  Lab 01/12/22 2148  NA 134*  K 5.1   CL 91*  CO2 28  GLUCOSE 80  BUN 46*  CREATININE 10.50*  CALCIUM 9.0     CBC: Recent Labs  Lab 01/12/22 2148  WBC 5.7  NEUTROABS 3.8  HGB 15.8  HCT 47.4  MCV 85.9  PLT 235      Urinalysis: No results for input(s): "COLORURINE", "LABSPEC", "PHURINE", "GLUCOSEU", "HGBUR", "BILIRUBINUR", "KETONESUR", "PROTEINUR", "UROBILINOGEN", "NITRITE", "LEUKOCYTESUR" in the last 72 hours.  Invalid input(s): "APPERANCEUR"    Imaging: US Venous Img Lower Unilateral Right  Result Date: 01/13/2022 CLINICAL DATA:  RIGHT lower extremity pain and swelling, RIGHT leg wound, question deep venous thrombosis EXAM: RIGHT LOWER EXTREMITY VENOUS DOPPLER ULTRASOUND TECHNIQUE: Gray-scale sonography with compression, as well as color and duplex ultrasound, were performed to evaluate the deep venous system(s) from the level of the common femoral vein through the popliteal and proximal calf veins. COMPARISON:  None Available. FINDINGS: VENOUS Normal compressibility of the common femoral, superficial femoral, and popliteal veins, as well as the visualized calf veins. Visualized portions of profunda femoral vein and great saphenous vein unremarkable. No filling defects to suggest DVT on grayscale or color Doppler imaging. Doppler waveforms show normal direction of venous flow, normal respiratory plasticity and response to augmentation. Limited views of the contralateral common femoral vein are unremarkable. OTHER None. Limitations: none IMPRESSION: No evidence of deep venous thrombosis in the RIGHT lower extremity. Electronically Signed   By: Lavonia Dana M.D.   On: 01/13/2022 08:32     Medications:      amiodarone  200 mg Oral BID   apixaban  2.5 mg Oral BID   bumetanide  1 mg Oral BID   carvedilol  3.125 mg Oral BID   gabapentin  300 mg Oral BID   isosorbide mononitrate  30 mg Oral Daily   losartan  100 mg Oral Daily   NIFEdipine  30 mg Oral Daily   rosuvastatin  40 mg Oral Daily   sertraline  50 mg  Oral Daily   sevelamer carbonate  1,600 mg Oral TID WC   silver sulfADIAZINE  1 Application Topical Daily    Assessment/ Plan:     Principal Problem:   Ulcer of right lower extremity (HCC) Active Problems:   Type 2 diabetes mellitus with ESRD (end-stage renal disease) (HCC)   Heart failure with reduced ejection fraction (HCC)   Atrial fibrillation ([place])  61 year old male with hypertension, neuropathy, atrial fibrillation on Eliquis and amiodarone, insulin-dependent diabetes mellitus, insomnia, who presents to the emergency department for chief concerns of worsening right lower extremity wound.  [facility]/MWF/left aVF   #1: ESRD on hemodialysis.  Will maintain outpatient schedule if possible.  Last treatment received yesterday.  Next treatment scheduled for Wednesday.   #2: Second hyperparathyroidism: Calcium within acceptable range.  Patient prescribed sevelamer with meals outpatient.   #3: Hypertension with chronic kidney disease: Was receiving Bumex, nifedipine, losartan, isosorbide and carvedilol.  All currently prescribed.  Blood pressure 101/64.  #4: Congestive heart failure:  2D echo 01/10/2020-LVEF 35 to 40%, global hypokinesis, severe concentric LVH Continue Bumex.   LOS: 0 [facility] 10/31/20232:34 PM

## 2022-01-13 NOTE — H&P (Signed)
History and Physical    Patient: Adam Howell DHR:[id number] DOB: [birth year] DOA: 01/13/2022 DOS: the patient was seen and examined on 01/13/2022 PCP: Venita Lick, NP  Patient coming from: Home  Chief Complaint:  Chief Complaint  Patient presents with   Leg Pain   HPI: Adam Howell is a 61 y.o. male with medical history significant for diabetes mellitus with complications of end-stage renal disease on hemodialysis (M/W/F), history of A-fib on chronic anticoagulation therapy, chronic pain syndrome, secondary hyperparathyroidism who was recently admitted and discharged from the hospital on 01/07/22 for a nonhealing right lower extremity wound.  Wound culture from his right lower extremity yielded Proteus mirabilis and Klebsiella oxytoca and patient was discharged on Keflex. He returns to the emergency room today for increasing pain, swelling and increased purulent discharge from his right leg ulcer since his discharge despite taking his antibiotics as prescribed. He denies having any fever or chills He denies having any chest pain, no shortness of breath, no nausea, no vomiting, no abdominal pain, no dizziness, no lightheadedness, no headache, no blurred vision, no focal deficit. He will be admitted to the hospital for evaluation of worsening, nonhealing right lower extremity ulcer.    Review of Systems: As mentioned in the history of present illness. All other systems reviewed and are negative. Past Medical History:  Diagnosis Date   Chronic kidney disease    Congestive heart failure ([place])    Diabetes mellitus without complication ([place])    Hyperlipidemia    Hypertension    Past Surgical History:  Procedure Laterality Date   WOUND DEBRIDEMENT Left    Social History:  reports that he has never smoked. He has never used smokeless tobacco. He reports that he does not currently use alcohol. He reports that he does not use drugs.  Allergies  Allergen Reactions    Tramadol Rash    Family History  Problem Relation Age of Onset   Heart failure Mother    Cirrhosis Father    Hypertension Daughter     Prior to Admission medications   Medication Sig Start Date End Date Taking? Authorizing Provider  amiodarone (PACERONE) 200 MG tablet Take 1 tablet (200 mg total) by mouth 2 (two) times daily. 04/10/21   Cannady, Henrine Screws T, NP  Blood Glucose Monitoring Suppl (ONETOUCH VERIO) w/Device KIT Use to check blood sugar 3 times a day and document results, bring to appointments.  Goal is <130 fasting blood sugar and <180 two hours after meals. 04/10/21   Cannady, Henrine Screws T, NP  Blood Pressure Monitoring (BLOOD PRESSURE MONITOR AUTOMAT) DEVI Use to check blood pressure twice daily. 11/14/19   Cannady, Henrine Screws T, NP  bumetanide (BUMEX) 1 MG tablet Take 1 tablet (1 mg total) by mouth 2 (two) times daily. 04/10/21   Cannady, Henrine Screws T, NP  carvedilol (COREG) 3.125 MG tablet Take 1 tablet (3.125 mg total) by mouth 2 (two) times daily. 04/10/21   Cannady, Jolene T, NP  ELIQUIS 2.5 MG TABS tablet Take 1 tablet (2.5 mg total) by mouth 2 (two) times daily. Sig: Daily as presciribed. 04/10/21   Cannady, Henrine Screws T, NP  gabapentin (NEURONTIN) 300 MG capsule Take 1 capsule (300 mg total) by mouth 2 (two) times daily. 04/10/21   Cannady, Henrine Screws T, NP  glucose blood (ONETOUCH VERIO) test strip Use to check blood sugar 4 times a day. 04/10/21   Cannady, Henrine Screws T, NP  HYDROcodone-acetaminophen (NORCO) 10-325 MG tablet Take 1 tablet by mouth every 8 (eight) hours  as needed. 01/05/22   Fritzi Mandes, MD  insulin aspart (NOVOLOG) 100 UNIT/ML FlexPen Inject 3 Units into the skin 3 (three) times daily with meals. As needed only if blood sugar greater then 130 prior to meal. 08/29/21   Cannady, Jolene T, NP  Insulin Pen Needle (NOVOFINE) 30G X 8 MM MISC Use to inject insulin daily 08/28/21   Cannady, Henrine Screws T, NP  isosorbide mononitrate (IMDUR) 30 MG 24 hr tablet Take 1 tablet (30 mg total) by mouth daily.  04/10/21   Cannady, Henrine Screws T, NP  Lancets (ONETOUCH ULTRASOFT) lancets Use to check blood sugar 4 times a day. 04/10/21   Cannady, Henrine Screws T, NP  losartan (COZAAR) 100 MG tablet Take 1 tablet (100 mg total) by mouth daily. 04/10/21   Cannady, Henrine Screws T, NP  NIFEdipine (PROCARDIA-XL/NIFEDICAL-XL) 30 MG 24 hr tablet Take 1 tablet (30 mg total) by mouth daily. 04/10/21   Cannady, Henrine Screws T, NP  rosuvastatin (CRESTOR) 40 MG tablet Take 1 tablet (40 mg total) by mouth daily. 04/10/21   Cannady, Henrine Screws T, NP  sertraline (ZOLOFT) 50 MG tablet Take 50 mg by mouth daily. 10/23/20   [provider]  sevelamer carbonate (RENVELA) 800 MG tablet Take 2 tablets (1,600 mg total) by mouth 3 (three) times daily. 04/10/21   Cannady, Henrine Screws T, NP  silver sulfADIAZINE (SILVADENE) 1 % cream Apply topically daily. 01/05/22   Fritzi Mandes, MD  tiZANidine (ZANAFLEX) 2 MG tablet Take 1 tablet (2 mg total) by mouth daily as needed for muscle spasms. 01/01/22   Cannady, Henrine Screws T, NP  traZODone (DESYREL) 50 MG tablet Take 1 tablet (50 mg total) by mouth at bedtime as needed for sleep. 04/10/21   Cannady, Henrine Screws T, NP  zolpidem (AMBIEN) 10 MG tablet Take 1 tablet (10 mg total) by mouth at bedtime as needed. 10/22/21   Venita Lick, NP    Physical Exam: Vitals:   01/12/22 2143 01/12/22 2144 01/13/22 0345 01/13/22 0753  BP: (!) 141/76  126/71 (!) 151/84  Pulse: 87  98 67  Resp: _0 Temp: 98.6 F (37 C)  97.7 F (36.5 C) 98.5 F (36.9 C)  TempSrc: Oral  Oral Oral  SpO2: 95%  95% 94%  Weight:  117.9 kg     Physical Exam Vitals and nursing note reviewed.  Constitutional:      Comments: Chronically ill-appearing  HENT:     Head: Normocephalic and atraumatic.     Nose: Nose normal.     Mouth/Throat:     Mouth: Mucous membranes are moist.  Eyes:     Comments: Pale conjunctiva  Cardiovascular:     Rate and Rhythm: Normal rate and regular rhythm.  Pulmonary:     Effort: Pulmonary effort is normal.      Breath sounds: Normal breath sounds.  Abdominal:     General: Abdomen is flat. Bowel sounds are normal.     Palpations: Abdomen is soft.  Musculoskeletal:     Cervical back: Normal range of motion and neck supple.     Comments: Right lower extremity wound over the anterior tibia with areas of surrounding erythema and purulent drainage  Skin:    General: Skin is warm and dry.  Neurological:     General: No focal deficit present.     Mental Status: [name] and oriented to person, place, and time.  Psychiatric:        Mood and Affect: Mood normal.  Behavior: Behavior normal.     Data Reviewed: Relevant notes from primary care and specialist visits, past discharge summaries as available in EHR, including Care Everywhere. Prior diagnostic testing as pertinent to current admission diagnoses Updated medications and problem lists for reconciliation ED course, including vitals, labs, imaging, treatment and response to treatment Triage notes, nursing and pharmacy notes and ED provider's notes Notable results as noted in HPI Labs reviewed.  Lactic acid 0.8, sodium 134, potassium 5.1, chloride 91, bicarb 28, glucose 80, BUN 46, creatinine 10.50, calcium 9.0, total protein 8.9, albumin 3.7, AST 20, ALT 14, alkaline phosphatase 72, total bilirubin 0.9, white count 5.7, hemoglobin 15.8, hematocrit 47.4, platelet count 235 Right lower extremity ultrasound shows no evidence of deep venous thrombosis in the RIGHT lower extremity.  There are no new results to review at this time.  Assessment and Plan: * Ulcer of right lower extremity ([place]) Patient has a full-thickness wound over the right anterior tibia with black eschar initially thought to be related to calciphylaxis. Wound appears to be infected and has increased surrounding erythema with purulent drainage Prior wound culture yielded Klebsiella oxytoca and Proteus  mirabilis and patient was discharged on Keflex Place patient on IV  Rocephin and IV vancomycin adjusted to renal function We will post wound care consult  Atrial fibrillation (HCC) Continue amiodarone and carvedilol for rate control Continue Eliquis as primary prophylaxis for an acute stroke  Heart failure with reduced ejection fraction (HCC) Stable and not acutely exacerbated Last known LVEF from 2021 was 35 to 40% Continue losartan, Bumex and carvedilol Continue renal replacement therapy to optimize volume status  Type 2 diabetes mellitus with ESRD (end-stage renal disease) ([place]) Patient has type 2 diabetes mellitus with complications of end-stage renal disease Dialysis days are Monday/Wednesday/Friday Maintain consistent carbohydrate diet Patient's  last hemoglobin A1c was 5.6 We will check blood sugars AC meals with no coverage at this time to reduce risk for hypoglycemia Nephrology consult      Advance Care Planning:   Code Status: Full Code   Consults: Wound care, nephrology  Family Communication: Greater than 50% of time was spent discussing plan of care with patient at the bedside.  All questions and concerns have been addressed.  He verbalizes understanding and agrees with the plan  Severity of Illness: The appropriate patient status for this patient is INPATIENT. Inpatient status is judged to be reasonable and necessary in order to provide the required intensity of service to ensure the patient's safety. The patient's presenting symptoms, physical exam findings, and initial radiographic and laboratory data in the context of their chronic comorbidities is felt to place them at high risk for further clinical deterioration. Furthermore, it is not anticipated that the patient will be medically stable for discharge from the hospital within 2 midnights of admission.   * I certify that at the point of admission it is my clinical judgment that the patient will require inpatient hospital care spanning beyond 2 midnights from the point of admission  due to high intensity of service, high risk for further deterioration and high frequency of surveillance required.*  Author: Collier Bullock, MD 01/13/2022 9:31 AM  For on call review [email or web address].

## 2022-01-13 NOTE — Assessment & Plan Note (Signed)
Stable and not acutely exacerbated Last known LVEF from 2021 was 35 to 40% Continue losartan, Bumex and carvedilol Continue renal replacement therapy to optimize volume status

## 2022-01-13 NOTE — ED Notes (Signed)
US at bedside

## 2022-01-13 NOTE — Assessment & Plan Note (Signed)
Patient has a full-thickness wound over the right anterior tibia with black eschar initially thought to be related to calciphylaxis. Wound appears to be infected and has increased surrounding erythema with purulent drainage Prior wound culture yielded Klebsiella oxytoca and Proteus  mirabilis and patient was discharged on Keflex Place patient on IV Rocephin and IV vancomycin adjusted to renal function We will post wound care consult

## 2022-01-13 NOTE — ED Notes (Signed)
Text paged Dr. Francine Graven - pt with hypotension 90s/70s. Patient denies lightheadedness and is fully oriented to baseline and previous assessments.   Pt drowsy and while dozing off, pt slightly hypoxic, placed on 2L [place] with pulse ox levels maintained > 94%  No new orders at this time.

## 2022-01-13 NOTE — Assessment & Plan Note (Signed)
Continue amiodarone and carvedilol for rate control Continue Eliquis as primary prophylaxis for an acute stroke

## 2022-01-13 NOTE — Consult Note (Signed)
PHARMACY -  BRIEF ANTIBIOTIC NOTE   Pharmacy has received consult(s) for vancomycin from an ED provider.  The patient's profile has been reviewed for ht/wt/allergies/indication/available labs.    One time order(s) placed for vancomycin 2 g  Further antibiotics/pharmacy consults should be ordered by admitting physician if indicated.                       Thank you, Darnelle Bos, PharmD 01/13/2022  6:40 AM

## 2022-01-13 NOTE — ED Provider Notes (Signed)
[facility] Provider Note    Event Date/Time   First MD Initiated Contact with Patient 01/13/22 [phone]     (approximate)   History   Leg Pain   HPI  Keyonte Cookston is a 61 y.o. male with history of end-stage renal disease on hemodialysis Monday, Wednesday and Friday, hypertension, diabetes hyperlipidemia, CHF who presents to the emergency department with right leg ulcer.  States that has been there for several weeks but pain, swelling and drainage worsened over the past day.  No fevers.  States his blood sugar has been well controlled and was last checked and was in the 90s.  Denies any known injury.  Has been on antibiotics for several days and feels like symptoms are worsening.  States he is scheduled to see wound care as an outpatient.   History provided by patient.    Past Medical History:  Diagnosis Date   Chronic kidney disease    Congestive heart failure ([place])    Diabetes mellitus without complication ([place])    Hyperlipidemia    Hypertension     Past Surgical History:  Procedure Laterality Date   WOUND DEBRIDEMENT Left     MEDICATIONS:  Prior to Admission medications   Medication Sig Start Date End Date Taking? Authorizing Provider  amiodarone (PACERONE) 200 MG tablet Take 1 tablet (200 mg total) by mouth 2 (two) times daily. 04/10/21   Cannady, Henrine Screws T, NP  Blood Glucose Monitoring Suppl (ONETOUCH VERIO) w/Device KIT Use to check blood sugar 3 times a day and document results, bring to appointments.  Goal is <130 fasting blood sugar and <180 two hours after meals. 04/10/21   Cannady, Henrine Screws T, NP  Blood Pressure Monitoring (BLOOD PRESSURE MONITOR AUTOMAT) DEVI Use to check blood pressure twice daily. 11/14/19   Cannady, Henrine Screws T, NP  bumetanide (BUMEX) 1 MG tablet Take 1 tablet (1 mg total) by mouth 2 (two) times daily. 04/10/21   Cannady, Henrine Screws T, NP  carvedilol (COREG) 3.125 MG tablet Take 1 tablet (3.125 mg total) by mouth 2 (two) times  daily. 04/10/21   Cannady, Jolene T, NP  ELIQUIS 2.5 MG TABS tablet Take 1 tablet (2.5 mg total) by mouth 2 (two) times daily. Sig: Daily as presciribed. 04/10/21   Cannady, Henrine Screws T, NP  gabapentin (NEURONTIN) 300 MG capsule Take 1 capsule (300 mg total) by mouth 2 (two) times daily. 04/10/21   Cannady, Henrine Screws T, NP  glucose blood (ONETOUCH VERIO) test strip Use to check blood sugar 4 times a day. 04/10/21   Cannady, Henrine Screws T, NP  HYDROcodone-acetaminophen (NORCO) 10-325 MG tablet Take 1 tablet by mouth every 8 (eight) hours as needed. 01/05/22   Fritzi Mandes, MD  insulin aspart (NOVOLOG) 100 UNIT/ML FlexPen Inject 3 Units into the skin 3 (three) times daily with meals. As needed only if blood sugar greater then 130 prior to meal. 08/29/21   Cannady, Jolene T, NP  Insulin Pen Needle (NOVOFINE) 30G X 8 MM MISC Use to inject insulin daily 08/28/21   Cannady, Henrine Screws T, NP  isosorbide mononitrate (IMDUR) 30 MG 24 hr tablet Take 1 tablet (30 mg total) by mouth daily. 04/10/21   Cannady, Henrine Screws T, NP  Lancets (ONETOUCH ULTRASOFT) lancets Use to check blood sugar 4 times a day. 04/10/21   Cannady, Henrine Screws T, NP  losartan (COZAAR) 100 MG tablet Take 1 tablet (100 mg total) by mouth daily. 04/10/21   Cannady, Henrine Screws T, NP  NIFEdipine (PROCARDIA-XL/NIFEDICAL-XL) 30 MG 24  hr tablet Take 1 tablet (30 mg total) by mouth daily. 04/10/21   Cannady, Henrine Screws T, NP  rosuvastatin (CRESTOR) 40 MG tablet Take 1 tablet (40 mg total) by mouth daily. 04/10/21   Cannady, Henrine Screws T, NP  sertraline (ZOLOFT) 50 MG tablet Take 50 mg by mouth daily. 10/23/20   [provider]  sevelamer carbonate (RENVELA) 800 MG tablet Take 2 tablets (1,600 mg total) by mouth 3 (three) times daily. 04/10/21   Cannady, Henrine Screws T, NP  silver sulfADIAZINE (SILVADENE) 1 % cream Apply topically daily. 01/05/22   Fritzi Mandes, MD  tiZANidine (ZANAFLEX) 2 MG tablet Take 1 tablet (2 mg total) by mouth daily as needed for muscle spasms. 01/01/22   Cannady, Henrine Screws  T, NP  traZODone (DESYREL) 50 MG tablet Take 1 tablet (50 mg total) by mouth at bedtime as needed for sleep. 04/10/21   Cannady, Henrine Screws T, NP  zolpidem (AMBIEN) 10 MG tablet Take 1 tablet (10 mg total) by mouth at bedtime as needed. 10/22/21   Venita Lick, NP    Physical Exam   Triage Vital Signs: ED Triage Vitals  Enc Vitals Group     BP 01/12/22 2143 (!) 141/76     Pulse Rate 01/12/22 2143 87     Resp 01/12/22 2143 20     Temp 01/12/22 2143 98.6 F (37 C)     Temp Source 01/12/22 2143 Oral     SpO2 01/12/22 2143 95 %     Weight 01/12/22 2144 260 lb (117.9 kg)     Height --      Head Circumference --      Peak Flow --      Pain Score 01/12/22 2144 10     Pain Loc --      Pain Edu? --      Excl. in [place]? --     Most recent vital signs: Vitals:   01/12/22 2143 01/13/22 0345  BP: (!) 141/76 126/71  Pulse: 87 98  Resp: 20 18  Temp: 98.6 F (37 C) 97.7 F (36.5 C)  SpO2: 95% 95%    CONSTITUTIONAL: Alert and oriented and responds appropriately to questions.  Obese, chronically ill-appearing HEAD: Normocephalic, atraumatic EYES: Conjunctivae clear, pupils appear equal, sclera nonicteric ENT: normal nose; moist mucous membranes NECK: Supple, normal ROM CARD: RRR; S1 and S2 appreciated; no murmurs, no clicks, no rubs, no gallops RESP: Normal chest excursion without splinting or tachypnea; breath sounds clear and equal bilaterally; no wheezes, no rhonchi, no rales, no hypoxia or respiratory distress, speaking full sentences ABD/GI: Normal bowel sounds; non-distended; soft, non-tender, no rebound, no guarding, no peritoneal signs BACK: The back appears normal EXT: Patient has a large ulcerated lesion to the distal shin of the right lower extremity with purulent foul-smelling drainage coming from the center.  There is surrounding warmth and induration around this area but no significant redness noted.  His extremity is warm well perfused.  No calf tenderness or calf swelling.   Compartments are soft.  He has difficulty walking on this leg due to pain. SKIN: Normal color for age and race; warm; no rash on exposed skin NEURO: Moves all extremities equally, normal speech PSYCH: The patient's mood and manner are appropriate.     RIGHT leg    Patient gave verbal permission to utilize photo for medical documentation only. The image was not stored on any personal device.    ED Results / Procedures / Treatments   LABS: (all labs ordered  are listed, but only abnormal results are displayed) Labs Reviewed  COMPREHENSIVE METABOLIC PANEL - Abnormal; Notable for the following components:      Result Value   Sodium 134 (*)    Chloride 91 (*)    BUN 46 (*)    Creatinine, Ser 10.50 (*)    Total Protein 8.9 (*)    GFR, Estimated 5 (*)    All other components within normal limits  AEROBIC/ANAEROBIC CULTURE W GRAM STAIN (SURGICAL/DEEP WOUND)  CBC WITH DIFFERENTIAL/PLATELET  LACTIC ACID, PLASMA     EKG:  RADIOLOGY: My personal review and interpretation of imaging:    I have personally reviewed all radiology reports.   No results found.   PROCEDURES:  Critical Care performed: No     Procedures    IMPRESSION / MDM / ASSESSMENT AND PLAN / ED COURSE  I reviewed the triage vital signs and the nursing notes.    Patient here with increasing size, swelling and pain to a right lower extremity ulcer.  Recently admitted to the hospital for the same on 01/02/2022 to 01/07/2022 and cultures grew Klebsiella and Proteus mirabilis.  He was sent home on Keflex.  The patient is on the cardiac monitor to evaluate for evidence of arrhythmia and/or significant heart rate changes.   DIFFERENTIAL DIAGNOSIS (includes but not limited to):   Cellulitis, infected ulcerated diabetic lesion, doubt sepsis   Patient's presentation is most consistent with acute presentation with potential threat to life or bodily function.   PLAN: Work-up initiated from triage.  Labs  show no leukocytosis.  Chronic kidney disease with no electrolyte derangement.  Lactic is normal.  Ulcer seems superficial and I have low suspicion for osteomyelitis based on exam.  He states he has been on Keflex since discharge on the 25th and feels like this area is getting worse.  It appears he is supposed to see dermatology as an outpatient.  We will give broad IV antibiotic coverage and pain medication.  Patient feels like he needs readmission to the hospital.  Will discuss with hospitalist service.   MEDICATIONS GIVEN IN ED: Medications  cefTRIAXone (ROCEPHIN) 2 g in sodium chloride 0.9 % 100 mL IVPB (has no administration in time range)  vancomycin (VANCOREADY) IVPB 2000 mg/400 mL (has no administration in time range)  fentaNYL (SUBLIMAZE) injection 50 mcg (50 mcg Intravenous Given 01/13/22 0655)  ondansetron (ZOFRAN) injection 4 mg (4 mg Intravenous Given 01/13/22 0655)     ED COURSE: When compared to pictures in the chart from 01/04/2022,  wound does look larger and has purulent drainage and surrounding induration and swelling.   CONSULTS:  Consulted and discussed patient's case with hospitalist, Dr. Francine Graven.  I have recommended admission and consulting physician agrees and will place admission orders.  Patient (and family if present) agree with this plan.   I reviewed all nursing notes, vitals, pertinent previous records.  All labs, EKGs, imaging ordered have been independently reviewed and interpreted by myself.    OUTSIDE RECORDS REVIEWED: Reviewed patient's last admission on 01/02/2022 to 01/07/2022.  Patient had normal ABIs at that time and a negative right tibia/fibula x-ray.       FINAL CLINICAL IMPRESSION(S) / ED DIAGNOSES   Final diagnoses:  Diabetic ulcer of right lower leg ([place])     Rx / DC Orders   ED Discharge Orders     None        Note:  This document was prepared using Dragon voice recognition software and may include  unintentional dictation  errors.   Marilou Barnfield, Delice Bison, DO 01/13/22 [phone]

## 2022-01-13 NOTE — Progress Notes (Signed)
Pharmacy Antibiotic Note  Rossi Burdo is a 61 y.o. male w/ PMH of DM, ESRD on HD, atrial fibrillation, chronic pain syndrome, secondary hyperparathyroidism admitted on 01/13/2022 with cellulitis.  Pharmacy has been consulted for vancomycin dosing. He received 2000 mg IV vancomycin in the ED  Plan: start vancomycin 1000 mg IV w/ each HD session --goal vancomycin level 15 - 25 mcg/mL (level drawn prior to 3rd HD session) --follow WCx for potential narrowing of antibiotics   Height: 6\' 3"  (190.5 cm) Weight: 117.9 kg (260 lb) IBW/kg (Calculated) : 84.5  Temp (24hrs), Avg:98.3 F (36.8 C), Min:97.7 F (36.5 C), Max:98.6 F (37 C)  Recent Labs  Lab 01/12/22 2148  WBC 5.7  CREATININE 10.50*  LATICACIDVEN 0.8    Estimated Creatinine Clearance: 10.4 mL/min (A) (by C-G formula based on SCr of 10.5 mg/dL (H)).    Allergies  Allergen Reactions   Tramadol Rash    Antimicrobials this admission: 10/31 vancomycin >>  10/31 ceftriaxone >>   Microbiology results: 10/31 WCx: pending   Thank you for allowing pharmacy to be a part of this patient's care.  Dallie Piles 01/13/2022 8:02 PM

## 2022-01-13 NOTE — Assessment & Plan Note (Signed)
Patient has type 2 diabetes mellitus with complications of end-stage renal disease Dialysis days are Monday/Wednesday/Friday Maintain consistent carbohydrate diet Patient's  last hemoglobin A1c was 5.6 We will check blood sugars AC meals with no coverage at this time to reduce risk for hypoglycemia Nephrology consult

## 2022-01-13 NOTE — Plan of Care (Signed)

## 2022-01-14 ENCOUNTER — Inpatient Hospital Stay: Payer: Medicare Other | Admitting: Nurse Practitioner

## 2022-01-14 DIAGNOSIS — E1122 Type 2 diabetes mellitus with diabetic chronic kidney disease: Secondary | ICD-10-CM

## 2022-01-14 DIAGNOSIS — I5020 Unspecified systolic (congestive) heart failure: Secondary | ICD-10-CM | POA: Diagnosis not present

## 2022-01-14 DIAGNOSIS — I4811 Longstanding persistent atrial fibrillation: Secondary | ICD-10-CM | POA: Diagnosis not present

## 2022-01-14 DIAGNOSIS — N186 End stage renal disease: Secondary | ICD-10-CM

## 2022-01-14 DIAGNOSIS — L97912 Non-pressure chronic ulcer of unspecified part of right lower leg with fat layer exposed: Secondary | ICD-10-CM | POA: Diagnosis not present

## 2022-01-14 LAB — GLUCOSE, CAPILLARY
Glucose-Capillary: 90 mg/dL (ref 70–99)
Glucose-Capillary: 100 mg/dL — ABNORMAL HIGH (ref 70–99)
Glucose-Capillary: 121 mg/dL — ABNORMAL HIGH (ref 70–99)

## 2022-01-14 LAB — CBC WITH DIFFERENTIAL/PLATELET
Abs Immature Granulocytes: 0.03 10*3/uL (ref 0.00–0.07)
Basophils Absolute: 0.1 10*3/uL (ref 0.0–0.1)
Basophils Relative: 1 %
Eosinophils Absolute: 0.4 10*3/uL (ref 0.0–0.5)
Eosinophils Relative: 6 %
HCT: 43.5 % (ref 39.0–52.0)
Hemoglobin: 14.4 g/dL (ref 13.0–17.0)
Immature Granulocytes: 1 %
Lymphocytes Relative: 14 %
Lymphs Abs: 0.9 10*3/uL (ref 0.7–4.0)
MCH: 28.5 pg (ref 26.0–34.0)
MCHC: 33.1 g/dL (ref 30.0–36.0)
MCV: 86 fL (ref 80.0–100.0)
Monocytes Absolute: 0.6 10*3/uL (ref 0.1–1.0)
Monocytes Relative: 10 %
Neutro Abs: 4.6 10*3/uL (ref 1.7–7.7)
Neutrophils Relative %: 68 %
Platelets: 238 10*3/uL (ref 150–400)
RBC: 5.06 MIL/uL (ref 4.22–5.81)
RDW: 14.9 % (ref 11.5–15.5)
WBC: 6.7 10*3/uL (ref 4.0–10.5)
nRBC: 0 % (ref 0.0–0.2)

## 2022-01-14 LAB — RENAL FUNCTION PANEL
Albumin: 3.1 g/dL — ABNORMAL LOW (ref 3.5–5.0)
Anion gap: 16 — ABNORMAL HIGH (ref 5–15)
BUN: 75 mg/dL — ABNORMAL HIGH (ref 6–20)
CO2: 26 mmol/L (ref 22–32)
Calcium: 8.4 mg/dL — ABNORMAL LOW (ref 8.9–10.3)
Chloride: 88 mmol/L — ABNORMAL LOW (ref 98–111)
Creatinine, Ser: 13.07 mg/dL — ABNORMAL HIGH (ref 0.61–1.24)
GFR, Estimated: 4 mL/min — ABNORMAL LOW (ref >60)
Glucose, Bld: 123 mg/dL — ABNORMAL HIGH (ref 70–99)
Phosphorus: 8.7 mg/dL — ABNORMAL HIGH (ref 2.5–4.6)
Potassium: 5.1 mmol/L (ref 3.5–5.1)
Sodium: 130 mmol/L — ABNORMAL LOW (ref 135–145)

## 2022-01-14 LAB — BASIC METABOLIC PANEL
Anion gap: 16 — ABNORMAL HIGH (ref 5–15)
BUN: 74 mg/dL — ABNORMAL HIGH (ref 6–20)
CO2: 25 mmol/L (ref 22–32)
Calcium: 8.4 mg/dL — ABNORMAL LOW (ref 8.9–10.3)
Chloride: 89 mmol/L — ABNORMAL LOW (ref 98–111)
Creatinine, Ser: 12.96 mg/dL — ABNORMAL HIGH (ref 0.61–1.24)
GFR, Estimated: 4 mL/min — ABNORMAL LOW (ref >60)
Glucose, Bld: 119 mg/dL — ABNORMAL HIGH (ref 70–99)
Potassium: 5 mmol/L (ref 3.5–5.1)
Sodium: 130 mmol/L — ABNORMAL LOW (ref 135–145)

## 2022-01-14 LAB — MRSA NEXT GEN BY PCR, NASAL: MRSA by PCR Next Gen: NOT DETECTED

## 2022-01-14 LAB — HEPATITIS B SURFACE ANTIGEN: Hepatitis B Surface Ag: NONREACTIVE

## 2022-01-14 MED ORDER — HYDROCODONE-ACETAMINOPHEN 10-325 MG PO TABS
1.0000 | ORAL_TABLET | ORAL | Status: DC | PRN
  Administered 2022-01-14 – 2022-01-19 (×19): 1 via ORAL
  Filled 2022-01-14 (×20): qty 1

## 2022-01-14 MED ORDER — PENTAFLUOROPROP-TETRAFLUOROETH EX AERO
INHALATION_SPRAY | CUTANEOUS | Status: AC
  Filled 2022-01-14: qty 30

## 2022-01-14 NOTE — Plan of Care (Signed)

## 2022-01-14 NOTE — Progress Notes (Signed)
PROGRESS NOTE    Adam Howell   ZOX:[id number] DOB: 10-23-1960  DOA: 01/13/2022 Date of Service: 01/14/22 PCP: Venita Lick, NP     Brief Narrative / Hospital Course:  Adam Howell is a 61 y.o. male with medical history significant for diabetes mellitus with complications of end-stage renal disease on hemodialysis (M/W/F), history of A-fib on chronic anticoagulation therapy, chronic pain syndrome, secondary hyperparathyroidism  He was recently admitted and discharged from the hospital on 01/07/22 for a nonhealing right lower extremity wound.  Wound culture from his right lower extremity yielded Proteus mirabilis and Klebsiella oxytoca and patient was discharged on Keflex. He returns to the emergency room for increasing pain, swelling and increased purulent discharge from his right leg ulcer since his discharge despite taking his antibiotics as prescribed. Admitted to the hospital for evaluation of worsening, nonhealing right lower extremity ulcer. Cultures repeated, started on rocephin and vancomycin    Consultants:  Nephrology  Procedures: none      ASSESSMENT & PLAN:   Principal Problem:   Ulcer of right lower extremity ([place]) Active Problems:   Type 2 diabetes mellitus with ESRD (end-stage renal disease) ([place])   Heart failure with reduced ejection fraction (HCC)   Atrial fibrillation (HCC)   Ulcer of right lower extremity (HCC) Cellulitis  Wound infection  Possible calciphylaxis  Patient has a full-thickness wound over the right anterior tibia with black eschar initially thought to be related to calciphylaxis. Wound appears to be infected and has increased surrounding erythema with purulent drainage Prior wound culture yielded Klebsiella oxytoca and Proteus mirabilis and patient was discharged on Keflex Place patient on IV Rocephin and IV vancomycin adjusted to renal function wound care consult Cultures pending  Consider ID consult if not improving    Type 2 diabetes mellitus with ESRD (end-stage renal disease) ([place]) Patient has type 2 diabetes mellitus with complications of end-stage renal disease Patient's  last hemoglobin A1c was 5.6 Maintain consistent carbohydrate diet We will check blood sugars AC meals with no coverage at this time to reduce risk for hypoglycemia Nephrology consult Dialysis Monday/Wednesday/Friday  Heart failure with reduced ejection fraction (HCC) Stable and not acutely exacerbated Last known LVEF from 2021 was 35 to 40% Continue losartan, Bumex and carvedilol Continue renal replacement therapy to optimize volume status  Atrial fibrillation (HCC) Continue amiodarone and carvedilol for rate control Continue Eliquis as primary prophylaxis for an acute stroke    DVT prophylaxis: Eliquis Pertinent IV fluids/nutrition: none Central lines / invasive devices: none  Code Status: FULL CODE Family Communication: none at this time   Disposition: inpatient  TOC needs: none at this time, pending clinical improvement  Barriers to discharge / significant pending items: IV abx pending cultures, anticipate will be in hospital another 1-2 nights pending clinical course              Subjective:  Patient reports pain in RLE but otherwise no concerns, he is tired. Examined in dialysis suite.        Objective:  Vitals:   01/14/22 1430 01/14/22 1500 01/14/22 1527 01/14/22 1544  BP: 107/81 111/68 119/71 (!) 107/59  Pulse: 74 74 74 71  Resp: 18 13 (!) 21 12  Temp:   (!) 97.5 F (36.4 C) (!) 97.5 F (36.4 C)  TempSrc:   Oral Oral  SpO2: 97% 99% 100% 99%  Weight:      Height:        Intake/Output Summary (Last 24 hours) at 01/14/2022 1624  Last data filed at 01/14/2022 1527 Gross per 24 hour  Intake 480 ml  Output 0 ml  Net 480 ml   Filed Weights   01/12/22 2144 01/13/22 1842 01/14/22 1105  Weight: 117.9 kg 117.9 kg 114.3 kg    Examination:  Constitutional:  VS as above General  Appearance: alert, well-developed, well-nourished, NAD Respiratory: Normal respiratory effort No rales Cardiovascular: S1/S2 normal No murmur No lower extremity edema Gastrointestinal: No tenderness Musculoskeletal:  No clubbing/cyanosis of digits Symmetrical movement in all extremities Neurological: No cranial nerve deficit on limited exam Alert Psychiatric: Normal judgment/insight Normal mood and affect        Scheduled Medications:   amiodarone  200 mg Oral BID   apixaban  2.5 mg Oral BID   bumetanide  1 mg Oral BID   carvedilol  3.125 mg Oral BID   Chlorhexidine Gluconate Cloth  6 each Topical Q0600   gabapentin  300 mg Oral BID   isosorbide mononitrate  30 mg Oral Daily   losartan  100 mg Oral Daily   NIFEdipine  30 mg Oral Daily   pentafluoroprop-tetrafluoroeth       rosuvastatin  40 mg Oral Daily   sertraline  50 mg Oral Daily   sevelamer carbonate  1,600 mg Oral TID WC   silver sulfADIAZINE  1 Application Topical Daily    Continuous Infusions:  anticoagulant sodium citrate     cefTRIAXone (ROCEPHIN)  IV 2 g (01/14/22 0643)   vancomycin 1,000 mg (01/14/22 1416)    PRN Medications:  alteplase, anticoagulant sodium citrate, heparin, HYDROcodone-acetaminophen, lidocaine (PF), lidocaine-prilocaine, ondansetron **OR** ondansetron (ZOFRAN) IV, pentafluoroprop-tetrafluoroeth, pentafluoroprop-tetrafluoroeth, tiZANidine, traZODone, zolpidem  Antimicrobials:  Anti-infectives (From admission, onward)    Start     Dose/Rate Route Frequency Ordered Stop   01/14/22 1200  vancomycin (VANCOCIN) IVPB 1000 mg/200 mL premix        1,000 mg 200 mL/hr over 60 Minutes Intravenous Every M-W-F (Hemodialysis) 01/13/22 2011     01/14/22 0600  cefTRIAXone (ROCEPHIN) 2 g in sodium chloride 0.9 % 100 mL IVPB        2 g 200 mL/hr over 30 Minutes Intravenous Every 24 hours 01/13/22 2002     01/13/22 0700  vancomycin (VANCOREADY) IVPB 2000 mg/400 mL        2,000 mg 200 mL/hr  over 120 Minutes Intravenous  Once 01/13/22 0651 01/13/22 1128   01/13/22 0645  cefTRIAXone (ROCEPHIN) 2 g in sodium chloride 0.9 % 100 mL IVPB        2 g 200 mL/hr over 30 Minutes Intravenous  Once 01/13/22 0630 01/13/22 4268       Data Reviewed: I have personally reviewed following labs and imaging studies  CBC: Recent Labs  Lab 01/12/22 2148 01/14/22 0902  WBC 5.7 6.7  NEUTROABS 3.8 4.6  HGB 15.8 14.4  HCT 47.4 43.5  MCV 85.9 86.0  PLT 235 341   Basic Metabolic Panel: Recent Labs  Lab 01/12/22 2148 01/14/22 0902  NA 134* 130*  130*  K 5.1 5.1  5.0  CL 91* 88*  89*  CO2 28 26  25   GLUCOSE 80 123*  119*  BUN 46* 75*  74*  CREATININE 10.50* 13.07*  12.96*  CALCIUM 9.0 8.4*  8.4*  PHOS  --  8.7*   GFR: Estimated Creatinine Clearance: 8.2 mL/min (A) (by C-G formula based on SCr of 13.07 mg/dL (H)). Liver Function Tests: Recent Labs  Lab 01/12/22 2148 01/14/22 0902  AST 20  --  ALT 14  --   ALKPHOS 72  --   BILITOT 0.9  --   PROT 8.9*  --   ALBUMIN 3.7 3.1*   No results for input(s): "LIPASE", "AMYLASE" in the last 168 hours. No results for input(s): "AMMONIA" in the last 168 hours. Coagulation Profile: No results for input(s): "INR", "PROTIME" in the last 168 hours. Cardiac Enzymes: No results for input(s): "CKTOTAL", "CKMB", "CKMBINDEX", "TROPONINI" in the last 168 hours. BNP (last 3 results) No results for input(s): "PROBNP" in the last 8760 hours. HbA1C: No results for input(s): "HGBA1C" in the last 72 hours. CBG: Recent Labs  Lab 01/07/22 1705 01/13/22 1157 01/13/22 1713 01/13/22 2105 01/14/22 0821  GLUCAP 112* 114* 92 121* 90   Lipid Profile: No results for input(s): "CHOL", "HDL", "LDLCALC", "TRIG", "CHOLHDL", "LDLDIRECT" in the last 72 hours. Thyroid Function Tests: No results for input(s): "TSH", "T4TOTAL", "FREET4", "T3FREE", "THYROIDAB" in the last 72 hours. Anemia Panel: No results for input(s): "VITAMINB12", "FOLATE",  "FERRITIN", "TIBC", "IRON", "RETICCTPCT" in the last 72 hours. Urine analysis: No results found for: "COLORURINE", "APPEARANCEUR", "LABSPEC", "PHURINE", "GLUCOSEU", "HGBUR", "BILIRUBINUR", "KETONESUR", "PROTEINUR", "UROBILINOGEN", "NITRITE", "LEUKOCYTESUR" Sepsis Labs: @LABRCNTIP (procalcitonin:4,lacticidven:4)  Recent Results (from the past 240 hour(s))  Aerobic/Anaerobic Culture w Gram Stain (surgical/deep wound)     Status: None (Preliminary result)   Collection Time: 01/13/22  6:51 AM   Specimen: Abscess  Result Value Ref Range Status   Specimen Description   Final    ABSCESS Performed at [facility], [address], [place], [place] [address]    Special Requests   Final    NONE Performed at [facility], [address] [place], [place] [address]    Gram Stain   Final    NO WBC SEEN FEW [place]    Culture   Final    CULTURE REINCUBATED FOR BETTER GROWTH Performed at [facility], [place] [address], [place], [place] [address]    Report Status PENDING  Incomplete  MRSA Next Gen by PCR, Nasal     Status: None   Collection Time: 01/14/22  8:47 AM   Specimen: Nasal Mucosa; Nasal Swab  Result Value Ref Range Status   MRSA by PCR Next Gen NOT DETECTED NOT DETECTED Final    Comment: (NOTE) The GeneXpert MRSA Assay (FDA approved for NASAL specimens only), is one component of a comprehensive MRSA colonization surveillance program. It is not intended to diagnose MRSA infection nor to guide or monitor treatment for MRSA infections. Test performance is not FDA approved in patients less than 73 years old. Performed at [facility], [address], [place], [place] [address]          Radiology Studies: US Venous Img Lower Unilateral Right  Result Date: 01/13/2022 CLINICAL DATA:  RIGHT lower extremity pain and swelling, RIGHT leg wound, question deep venous thrombosis EXAM:  RIGHT LOWER EXTREMITY VENOUS DOPPLER ULTRASOUND TECHNIQUE: Gray-scale sonography with compression, as well as color and duplex ultrasound, were performed to evaluate the deep venous system(s) from the level of the common femoral vein through the popliteal and proximal calf veins. COMPARISON:  None Available. FINDINGS: VENOUS Normal compressibility of the common femoral, superficial femoral, and popliteal veins, as well as the visualized calf veins. Visualized portions of profunda femoral vein and great saphenous vein unremarkable. No filling defects to suggest DVT on grayscale or color Doppler imaging. Doppler waveforms show normal direction of venous  flow, normal respiratory plasticity and response to augmentation. Limited views of the contralateral common femoral vein are unremarkable. OTHER None. Limitations: none IMPRESSION: No evidence of deep venous thrombosis in the RIGHT lower extremity. Electronically Signed   By: Lavonia Dana M.D.   On: 01/13/2022 08:32            LOS: 1 day        Emeterio Reeve, DO [facility] 01/14/2022, 4:24 PM   Staff may message me via secure chat in [place]  but this may not receive immediate response,  please page for urgent matters!  If 7PM-7AM, please contact night-coverage [email or web address]  Dictation software was used to generate the above note. Typos may occur and escape review, as with typed/written notes. Please contact Dr Sheppard Coil directly for clarity if needed.

## 2022-01-14 NOTE — Plan of Care (Signed)
  Problem: Education: Goal: Knowledge of General Education information will improve Description Including pain rating scale, medication(s)/side effects and non-pharmacologic comfort measures Outcome: Progressing   Problem: Health Behavior/Discharge Planning: Goal: Ability to manage health-related needs will improve Outcome: Progressing   

## 2022-01-14 NOTE — Progress Notes (Signed)
[facility]  PROGRESS NOTE   Subjective:   Adam Howell is a 61 year old male with past medical conditions including diabetes, hyperlipidemia, hypertension, CHF, and end-stage renal disease on hemodialysis.  Patient presents to the emergency department with leg pain and will be admitted for Wound infection [T14.8XXA, L08.9] Diabetic ulcer of right lower leg ([place]) [B71.696, V89.381]  Patient is known to our practice from previous admissions and receives outpatient dialysis treatments at [facility] on a MWF schedule, supervised by [facility] physicians.    Patient seen resting quietly in bed, Dialysis scheduled for later this morning Continues to complain of pain on right leg   Objective:  Vital signs: Blood pressure (!) 91/58, pulse 79, temperature 98.2 F (36.8 C), temperature source Oral, resp. rate 15, height 6\' 3"  (1.905 m), weight 114.3 kg, SpO2 99 %.  Intake/Output Summary (Last 24 hours) at 01/14/2022 1304 Last data filed at 01/14/2022 0900 Gross per 24 hour  Intake 480 ml  Output 0 ml  Net 480 ml    Filed Weights   01/12/22 2144 01/13/22 1842 01/14/22 1105  Weight: 117.9 kg 117.9 kg 114.3 kg     Physical Exam: General:  No acute distress  Head:  Normocephalic, atraumatic. Moist oral mucosal membranes  Eyes:  Anicteric  Lungs:   Clear to auscultation, normal effort  Heart:  S1S2 no rubs  Abdomen:   Soft, nontender, bowel sounds present  Extremities: 1-2+ peripheral edema.  Neurologic:  Awake, alert, following commands  Skin: Ulcer on the right leg  Access: Left aVF    Basic Metabolic Panel: Recent Labs  Lab 01/12/22 2148 01/14/22 0902  NA 134* 130*  130*  K 5.1 5.1  5.0  CL 91* 88*  89*  CO2 28 26  25   GLUCOSE 80 123*  119*  BUN 46* 75*  74*  CREATININE 10.50* 13.07*  12.96*  CALCIUM 9.0 8.4*  8.4*  PHOS  --  8.7*     CBC: Recent Labs  Lab 01/12/22 2148 01/14/22 0902  WBC 5.7 6.7  NEUTROABS 3.8 4.6  HGB 15.8  14.4  HCT 47.4 43.5  MCV 85.9 86.0  PLT 235 238      Urinalysis: No results for input(s): "COLORURINE", "LABSPEC", "PHURINE", "GLUCOSEU", "HGBUR", "BILIRUBINUR", "KETONESUR", "PROTEINUR", "UROBILINOGEN", "NITRITE", "LEUKOCYTESUR" in the last 72 hours.  Invalid input(s): "APPERANCEUR"    Imaging: US Venous Img Lower Unilateral Right  Result Date: 01/13/2022 CLINICAL DATA:  RIGHT lower extremity pain and swelling, RIGHT leg wound, question deep venous thrombosis EXAM: RIGHT LOWER EXTREMITY VENOUS DOPPLER ULTRASOUND TECHNIQUE: Gray-scale sonography with compression, as well as color and duplex ultrasound, were performed to evaluate the deep venous system(s) from the level of the common femoral vein through the popliteal and proximal calf veins. COMPARISON:  None Available. FINDINGS: VENOUS Normal compressibility of the common femoral, superficial femoral, and popliteal veins, as well as the visualized calf veins. Visualized portions of profunda femoral vein and great saphenous vein unremarkable. No filling defects to suggest DVT on grayscale or color Doppler imaging. Doppler waveforms show normal direction of venous flow, normal respiratory plasticity and response to augmentation. Limited views of the contralateral common femoral vein are unremarkable. OTHER None. Limitations: none IMPRESSION: No evidence of deep venous thrombosis in the RIGHT lower extremity. Electronically Signed   By: Lavonia Dana M.D.   On: 01/13/2022 08:32     Medications:    anticoagulant sodium citrate     cefTRIAXone (ROCEPHIN)  IV 2 g (01/14/22 0175)  vancomycin       amiodarone  200 mg Oral BID   apixaban  2.5 mg Oral BID   bumetanide  1 mg Oral BID   carvedilol  3.125 mg Oral BID   Chlorhexidine Gluconate Cloth  6 each Topical Q0600   gabapentin  300 mg Oral BID   isosorbide mononitrate  30 mg Oral Daily   losartan  100 mg Oral Daily   NIFEdipine  30 mg Oral Daily   pentafluoroprop-tetrafluoroeth        rosuvastatin  40 mg Oral Daily   sertraline  50 mg Oral Daily   sevelamer carbonate  1,600 mg Oral TID WC   silver sulfADIAZINE  1 Application Topical Daily    Assessment/ Plan:     Principal Problem:   Ulcer of right lower extremity (HCC) Active Problems:   Type 2 diabetes mellitus with ESRD (end-stage renal disease) ([place])   Heart failure with reduced ejection fraction (HCC)   Atrial fibrillation ([place])  61 year old male with hypertension, neuropathy, atrial fibrillation on Eliquis and amiodarone, insulin-dependent diabetes mellitus, insomnia, who presents to the emergency department for chief concerns of worsening right lower extremity wound.  [facility]/MWF/left aVF   #1: ESRD on hemodialysis.  Will maintain outpatient schedule if possible.  Scheduled to receive dialysis later today, UF goal 0.  Next treatment scheduled for Friday.   #2: Second hyperparathyroidism: Calcium within desired target however phosphorus elevated, 8.7.  Continue sevelamer with meals.   #3: Hypertension with chronic kidney disease: Receiving Bumex, nifedipine, losartan, isosorbide and carvedilol.  All currently prescribed.  Blood pressure 94/61  #4: Congestive heart failure:  2D echo 01/10/2020-LVEF 35 to 40%, global hypokinesis, severe concentric LVH Receiving Bumex 1 mg twice daily   LOS: 1 [facility] 11/1/20231:04 PM

## 2022-01-14 NOTE — Hospital Course (Addendum)
Adam Howell is a 61 y.o. male with medical history significant for diabetes mellitus with complications of end-stage renal disease on hemodialysis (M/W/F), history of A-fib on chronic anticoagulation therapy, chronic pain syndrome, secondary hyperparathyroidism  He was recently admitted and discharged from the hospital on 01/07/22 for a nonhealing right lower extremity wound.  Wound culture from his right lower extremity yielded Proteus mirabilis and Klebsiella oxytoca and patient was discharged on Keflex. 10/31 He returns to the emergency room  for increasing pain, swelling and increased purulent drainage from his right leg ulcer since his hospitalization/discharge despite taking his antibiotics as prescribed. Admitted to the hospital for evaluation of worsening, nonhealing right lower extremity ulcer. Cultures repeated, started on rocephin and vancomycin  11/01-11/02, cultures pending/reincubated, remains on abx. MRSA screen neg. Consult surgery for possible debridement 11/03: Debridement RLE wound/ulcer in OR w/ Dr Delana Meyer. Initial cultures back - narrowed Abx to Unasyn   11/04: pend PT/OT eval (today is Saturday) expect will need HH/SNF  11/05: transition to po abx, TOC to arrange [facility] and can d/c once this is confirmed --> wound vac will be delivered to pt's home tomorrow, should be ok to discharge after dialysis tm    Consultants:  Nephrology  Procedures: 01/16/22: Debridement RLE wound/ulcer in OR w/ Dr Delana Meyer       ASSESSMENT & PLAN:   Principal Problem:   Ulcer of right lower extremity ([place]) Active Problems:   Type 2 diabetes mellitus with ESRD (end-stage renal disease) ([place])   Heart failure with reduced ejection fraction (HCC)   Atrial fibrillation (HCC)   Ulcer of right lower extremity ([place]) Cellulitis  Wound infection  Possible calciphylaxis  Patient has a full-thickness wound over the right anterior tibia with black eschar initially thought to be related to  calciphylaxis. Wound appears to be infected and has increased surrounding erythema with purulent drainage Place patient on IV Rocephin and IV vancomycin adjusted to renal function and based on last cultures --> adjusted to Unasyn 01/16/22 --> cultures back --> augmentin po Wound examined 01/15/22 - still foul smelling --> Consult surgery for possible debridement  Debridement RLE wound/ulcer in OR w/ Dr Delana Meyer 01/16/22  Wound vac in place Pathology pending  Cultures (+) TOC to arrange HH/SNF   Type 2 diabetes mellitus with ESRD (end-stage renal disease) ([place]) Patient has type 2 diabetes mellitus with complications of end-stage renal disease Patient's  last hemoglobin A1c was 5.6 Maintain consistent carbohydrate diet We will check blood sugars AC meals with no coverage at this time to reduce risk for hypoglycemia Nephrology consulted Dialysis Monday/Wednesday/Friday  Heart failure with reduced ejection fraction (HCC) Stable and not acutely exacerbated Last known LVEF from 2021 was 35 to 40% Continue losartan, Bumex and carvedilol Continue renal replacement therapy to optimize volume status  Atrial fibrillation (HCC) Continue amiodarone and carvedilol for rate control Continue Eliquis     DVT prophylaxis: Eliquis Pertinent IV fluids/nutrition: none Central lines / invasive devices: none. He has wound vac on RLE   Code Status: FULL CODE Family Communication: none at this time - pt declined call to anyone today    Disposition: inpatient  TOC needs: HH Barriers to discharge / significant pending items: confirm HH in place --> can get wound vac tomorrow (which is Monday) anticipate d/c tomorrow after dialysis here

## 2022-01-14 NOTE — Plan of Care (Signed)

## 2022-01-14 NOTE — TOC Progression Note (Signed)
Transition of Care [facility]) - Progression Note    Patient Details  Name: Adam Howell MRN: [id number] Date of Birth: 07/28/1960  Transition of Care [facility]) CM/SW [place], RN Phone Number: 01/14/2022, 10:00 AM  Clinical Narrative:     The patient was discharged from the hospital on 10/25, he was accepted by [facility] for [facility] services He was provided a rolling walker at the last admission by [facility] TOC to follow for additional needs and DC planning  Expected Discharge Plan: [place] Barriers to Discharge: Continued Medical Work up  Expected Discharge Plan and Services Expected Discharge Plan: [place]                                               Social Determinants of Health (SDOH) Interventions    Readmission Risk Interventions     No data to display

## 2022-01-14 NOTE — Progress Notes (Signed)
   01/14/22 1527  Vitals  Temp (!) 97.5 F (36.4 C)  Temp Source Oral  BP 119/71  MAP (mmHg) 84  BP Location Right Arm  BP Method Automatic  Patient Position (if appropriate) Lying  Pulse Rate 74  Pulse Rate Source Monitor  ECG Heart Rate 69  Resp (!) 21  Oxygen Therapy  SpO2 100 %  O2 Device Nasal Cannula  O2 Flow Rate (L/min) 2 L/min  Patient Activity (if Appropriate) In bed  Pulse Oximetry Type Continuous  During Treatment Monitoring  Blood Flow Rate (mL/min) 200 mL/min  HD Safety Checks Performed Yes  Intra-Hemodialysis Comments Tx completed  Post Treatment  Dialyzer Clearance Lightly streaked  Duration of HD Treatment -hour(s) 3.5 hour(s)  Hemodialysis Intake (mL) 0 mL  Liters Processed 74  Fluid Removed 0 mL  Tolerated HD Treatment Yes  Post-Hemodialysis Comments tx completed. no complications.  AVG/AVF Arterial Site Held (minutes) 10 minutes  AVG/AVF Venous Site Held (minutes) 10 minutes  Fistula / Graft Left Forearm Arteriovenous fistula  No placement date or time found.   Placed prior to admission: Yes  Orientation: Left  Access Location: Forearm  Access Type: Arteriovenous fistula  Site Condition No complications  Fistula / Graft Assessment Present;Thrill;Bruit  Status Deaccessed  Needle Size 15  Drainage Description None

## 2022-01-15 ENCOUNTER — Ambulatory Visit: Payer: Medicare Other | Admitting: Physician Assistant

## 2022-01-15 DIAGNOSIS — E1122 Type 2 diabetes mellitus with diabetic chronic kidney disease: Secondary | ICD-10-CM | POA: Diagnosis not present

## 2022-01-15 DIAGNOSIS — I5020 Unspecified systolic (congestive) heart failure: Secondary | ICD-10-CM | POA: Diagnosis not present

## 2022-01-15 DIAGNOSIS — L97919 Non-pressure chronic ulcer of unspecified part of right lower leg with unspecified severity: Secondary | ICD-10-CM | POA: Diagnosis not present

## 2022-01-15 DIAGNOSIS — E11622 Type 2 diabetes mellitus with other skin ulcer: Secondary | ICD-10-CM | POA: Diagnosis not present

## 2022-01-15 DIAGNOSIS — I4811 Longstanding persistent atrial fibrillation: Secondary | ICD-10-CM | POA: Diagnosis not present

## 2022-01-15 DIAGNOSIS — L97912 Non-pressure chronic ulcer of unspecified part of right lower leg with fat layer exposed: Secondary | ICD-10-CM | POA: Diagnosis not present

## 2022-01-15 LAB — RENAL FUNCTION PANEL
Albumin: 2.8 g/dL — ABNORMAL LOW (ref 3.5–5.0)
Anion gap: 13 (ref 5–15)
BUN: 51 mg/dL — ABNORMAL HIGH (ref 6–20)
CO2: 27 mmol/L (ref 22–32)
Calcium: 8 mg/dL — ABNORMAL LOW (ref 8.9–10.3)
Chloride: 91 mmol/L — ABNORMAL LOW (ref 98–111)
Creatinine, Ser: 9.8 mg/dL — ABNORMAL HIGH (ref 0.61–1.24)
GFR, Estimated: 6 mL/min — ABNORMAL LOW (ref >60)
Glucose, Bld: 103 mg/dL — ABNORMAL HIGH (ref 70–99)
Phosphorus: 6.4 mg/dL — ABNORMAL HIGH (ref 2.5–4.6)
Potassium: 4.7 mmol/L (ref 3.5–5.1)
Sodium: 131 mmol/L — ABNORMAL LOW (ref 135–145)

## 2022-01-15 LAB — GLUCOSE, CAPILLARY
Glucose-Capillary: 88 mg/dL (ref 70–99)
Glucose-Capillary: 88 mg/dL (ref 70–99)

## 2022-01-15 LAB — HEPATITIS B SURFACE ANTIBODY, QUANTITATIVE: Hep B S AB Quant (Post): 364.3 m[IU]/mL (ref >9.9)

## 2022-01-15 NOTE — Progress Notes (Signed)
PROGRESS NOTE    Adam Howell   IRJ:[id number] DOB: 08-02-1960  DOA: 01/13/2022 Date of Service: 01/15/22 PCP: Venita Lick, NP     Brief Narrative / Hospital Course:  Adam Howell is a 61 y.o. male with medical history significant for diabetes mellitus with complications of end-stage renal disease on hemodialysis (M/W/F), history of A-fib on chronic anticoagulation therapy, chronic pain syndrome, secondary hyperparathyroidism  He was recently admitted and discharged from the hospital on 01/07/22 for a nonhealing right lower extremity wound.  Wound culture from his right lower extremity yielded Proteus mirabilis and Klebsiella oxytoca and patient was discharged on Keflex. 10/31 He returns to the emergency room  for increasing pain, swelling and increased purulent drainage from his right leg ulcer since his hospitalization/discharge despite taking his antibiotics as prescribed. Admitted to the hospital for evaluation of worsening, nonhealing right lower extremity ulcer. Cultures repeated, started on rocephin and vancomycin  11/01-11/02, cultures pending/reincubated, remains on abx. MRSA screen neg. Consult surgery for possible debridement   Consultants:  Nephrology  Procedures: none      ASSESSMENT & PLAN:   Principal Problem:   Ulcer of right lower extremity ([place]) Active Problems:   Type 2 diabetes mellitus with ESRD (end-stage renal disease) ([place])   Heart failure with reduced ejection fraction (HCC)   Atrial fibrillation (HCC)   Ulcer of right lower extremity (HCC) Cellulitis  Wound infection  Possible calciphylaxis  Patient has a full-thickness wound over the right anterior tibia with black eschar initially thought to be related to calciphylaxis. Wound appears to be infected and has increased surrounding erythema with purulent drainage Prior wound culture yielded Klebsiella oxytoca and Proteus mirabilis and patient was discharged on Keflex Place  patient on IV Rocephin and IV vancomycin adjusted to renal function wound care consult Cultures pending  Wound examined today 01/15/22 - still foul smelling  Consult surgery for possible debridement   Type 2 diabetes mellitus with ESRD (end-stage renal disease) ([place]) Patient has type 2 diabetes mellitus with complications of end-stage renal disease Patient's  last hemoglobin A1c was 5.6 Maintain consistent carbohydrate diet We will check blood sugars AC meals with no coverage at this time to reduce risk for hypoglycemia Nephrology consult Dialysis Monday/Wednesday/Friday  Heart failure with reduced ejection fraction (HCC) Stable and not acutely exacerbated Last known LVEF from 2021 was 35 to 40% Continue losartan, Bumex and carvedilol Continue renal replacement therapy to optimize volume status  Atrial fibrillation (HCC) Continue amiodarone and carvedilol for rate control Continue Eliquis     DVT prophylaxis: Eliquis Pertinent IV fluids/nutrition: none Central lines / invasive devices: none  Code Status: FULL CODE Family Communication: none at this time   Disposition: inpatient  TOC needs: none at this time, pending clinical improvement  Barriers to discharge / significant pending items: IV abx pending cultures, anticipate will be in hospital another 1-2 nights pending clinical course, may need surgical debridement              Subjective:  Patient reports pain in RLE but otherwise no concerns, pain a bit better from yesterday.        Objective:  Vitals:   01/14/22 1627 01/14/22 2153 01/15/22 0529 01/15/22 0738  BP: 116/62 109/63 105/63 114/66  Pulse: 75 62 71 64  Resp: 18 17 17 17   Temp: (!) 97.4 F (36.3 C) 97.6 F (36.4 C) 97.8 F (36.6 C) 98.5 F (36.9 C)  TempSrc:      SpO2: 99% 96% 97%  100%  Weight:      Height:        Intake/Output Summary (Last 24 hours) at 01/15/2022 1411 Last data filed at 01/14/2022 2154 Gross per 24 hour  Intake  60 ml  Output 0 ml  Net 60 ml   Filed Weights   01/12/22 2144 01/13/22 1842 01/14/22 1105  Weight: 117.9 kg 117.9 kg 114.3 kg    Examination:  Constitutional:  VS as above General Appearance: alert, well-developed, well-nourished, NAD Respiratory: Normal respiratory effort No rales Cardiovascular: S1/S2 normal No murmur No lower extremity edema Gastrointestinal: No tenderness Musculoskeletal/Skin:  No clubbing/cyanosis of digits Symmetrical movement in all extremities Neurological: No cranial nerve deficit on limited exam Alert Psychiatric: Normal judgment/insight Normal mood and affect        Scheduled Medications:   amiodarone  200 mg Oral BID   apixaban  2.5 mg Oral BID   bumetanide  1 mg Oral BID   carvedilol  3.125 mg Oral BID   Chlorhexidine Gluconate Cloth  6 each Topical Q0600   gabapentin  300 mg Oral BID   isosorbide mononitrate  30 mg Oral Daily   losartan  100 mg Oral Daily   NIFEdipine  30 mg Oral Daily   rosuvastatin  40 mg Oral Daily   sertraline  50 mg Oral Daily   sevelamer carbonate  1,600 mg Oral TID WC   silver sulfADIAZINE  1 Application Topical Daily    Continuous Infusions:  anticoagulant sodium citrate     cefTRIAXone (ROCEPHIN)  IV 2 g (01/15/22 0537)   vancomycin Stopped (01/14/22 1650)    PRN Medications:  alteplase, anticoagulant sodium citrate, heparin, HYDROcodone-acetaminophen, lidocaine (PF), lidocaine-prilocaine, ondansetron **OR** ondansetron (ZOFRAN) IV, pentafluoroprop-tetrafluoroeth, tiZANidine, traZODone  Antimicrobials:  Anti-infectives (From admission, onward)    Start     Dose/Rate Route Frequency Ordered Stop   01/14/22 1200  vancomycin (VANCOCIN) IVPB 1000 mg/200 mL premix        1,000 mg 200 mL/hr over 60 Minutes Intravenous Every M-W-F (Hemodialysis) 01/13/22 2011     01/14/22 0600  cefTRIAXone (ROCEPHIN) 2 g in sodium chloride 0.9 % 100 mL IVPB        2 g 200 mL/hr over 30 Minutes Intravenous Every  24 hours 01/13/22 2002     01/13/22 0700  vancomycin (VANCOREADY) IVPB 2000 mg/400 mL        2,000 mg 200 mL/hr over 120 Minutes Intravenous  Once 01/13/22 0651 01/13/22 1128   01/13/22 0645  cefTRIAXone (ROCEPHIN) 2 g in sodium chloride 0.9 % 100 mL IVPB        2 g 200 mL/hr over 30 Minutes Intravenous  Once 01/13/22 0630 01/13/22 7616       Data Reviewed: I have personally reviewed following labs and imaging studies  CBC: Recent Labs  Lab 01/12/22 2148 01/14/22 0902  WBC 5.7 6.7  NEUTROABS 3.8 4.6  HGB 15.8 14.4  HCT 47.4 43.5  MCV 85.9 86.0  PLT 235 073   Basic Metabolic Panel: Recent Labs  Lab 01/12/22 2148 01/14/22 0902 01/15/22 0922  NA 134* 130*  130* 131*  K 5.1 5.1  5.0 4.7  CL 91* 88*  89* 91*  CO2 28 26  25 27   GLUCOSE 80 123*  119* 103*  BUN 46* 75*  74* 51*  CREATININE 10.50* 13.07*  12.96* 9.80*  CALCIUM 9.0 8.4*  8.4* 8.0*  PHOS  --  8.7* 6.4*   GFR: Estimated Creatinine Clearance: 10.9 mL/min (A) (by  C-G formula based on SCr of 9.8 mg/dL (H)). Liver Function Tests: Recent Labs  Lab 01/12/22 2148 01/14/22 0902 01/15/22 0922  AST 20  --   --   ALT 14  --   --   ALKPHOS 72  --   --   BILITOT 0.9  --   --   PROT 8.9*  --   --   ALBUMIN 3.7 3.1* 2.8*   No results for input(s): "LIPASE", "AMYLASE" in the last 168 hours. No results for input(s): "AMMONIA" in the last 168 hours. Coagulation Profile: No results for input(s): "INR", "PROTIME" in the last 168 hours. Cardiac Enzymes: No results for input(s): "CKTOTAL", "CKMB", "CKMBINDEX", "TROPONINI" in the last 168 hours. BNP (last 3 results) No results for input(s): "PROBNP" in the last 8760 hours. HbA1C: No results for input(s): "HGBA1C" in the last 72 hours. CBG: Recent Labs  Lab 01/14/22 0821 01/14/22 1629 01/14/22 2209 01/15/22 0742 01/15/22 1146  GLUCAP 90 100* 121* 88 88   Lipid Profile: No results for input(s): "CHOL", "HDL", "LDLCALC", "TRIG", "CHOLHDL", "LDLDIRECT"  in the last 72 hours. Thyroid Function Tests: No results for input(s): "TSH", "T4TOTAL", "FREET4", "T3FREE", "THYROIDAB" in the last 72 hours. Anemia Panel: No results for input(s): "VITAMINB12", "FOLATE", "FERRITIN", "TIBC", "IRON", "RETICCTPCT" in the last 72 hours. Urine analysis: No results found for: "COLORURINE", "APPEARANCEUR", "LABSPEC", "PHURINE", "GLUCOSEU", "HGBUR", "BILIRUBINUR", "KETONESUR", "PROTEINUR", "UROBILINOGEN", "NITRITE", "LEUKOCYTESUR" Sepsis Labs: @LABRCNTIP (procalcitonin:4,lacticidven:4)  Recent Results (from the past 240 hour(s))  Aerobic/Anaerobic Culture w Gram Stain (surgical/deep wound)     Status: None (Preliminary result)   Collection Time: 01/13/22  6:51 AM   Specimen: Abscess  Result Value Ref Range Status   Specimen Description   Final    ABSCESS Performed at [facility], [address] [place], [place] [address]    Special Requests   Final    NONE Performed at [facility], [address] [place], [place] [address]    Gram Stain   Final    NO WBC SEEN FEW GRAM NEGATIVE RODS FEW GRAM POSITIVE COCCI IN CHAINS RARE GRAM POSITIVE RODS    Culture   Final    CULTURE REINCUBATED FOR BETTER GROWTH Performed at [facility], [place] [address] [place], [place] [address]    Report Status PENDING  Incomplete  MRSA Next Gen by PCR, Nasal     Status: None   Collection Time: 01/14/22  8:47 AM   Specimen: Nasal Mucosa; Nasal Swab  Result Value Ref Range Status   MRSA by PCR Next Gen NOT DETECTED NOT DETECTED Final    Comment: (NOTE) The GeneXpert MRSA Assay (FDA approved for NASAL specimens only), is one component of a comprehensive MRSA colonization surveillance program. It is not intended to diagnose MRSA infection nor to guide or monitor treatment for MRSA infections. Test performance is not FDA approved in patients less than 71 years old. Performed at [facility], [address] [place], [place] [address]           Radiology Studies: US Venous Img Lower Unilateral Right  Result Date: 01/13/2022 CLINICAL DATA:  RIGHT lower extremity pain and swelling, RIGHT leg wound, question deep venous thrombosis EXAM: RIGHT LOWER EXTREMITY VENOUS DOPPLER ULTRASOUND TECHNIQUE: Gray-scale sonography with compression, as well as color and duplex ultrasound, were performed to evaluate the deep venous system(s) from the level of the common femoral vein through the popliteal and proximal calf veins. COMPARISON:  None Available. FINDINGS: VENOUS Normal compressibility of  the common femoral, superficial femoral, and popliteal veins, as well as the visualized calf veins. Visualized portions of profunda femoral vein and great saphenous vein unremarkable. No filling defects to suggest DVT on grayscale or color Doppler imaging. Doppler waveforms show normal direction of venous flow, normal respiratory plasticity and response to augmentation. Limited views of the contralateral common femoral vein are unremarkable. OTHER None. Limitations: none IMPRESSION: No evidence of deep venous thrombosis in the RIGHT lower extremity. Electronically Signed   By: Lavonia Dana M.D.   On: 01/13/2022 08:32            LOS: 2 days        Emeterio Reeve, DO [facility] 01/15/2022, 2:11 PM   Staff may message me via secure chat in [place]  but this may not receive immediate response,  please page for urgent matters!  If 7PM-7AM, please contact night-coverage [email or web address]  Dictation software was used to generate the above note. Typos may occur and escape review, as with typed/written notes. Please contact Dr Sheppard Coil directly for clarity if needed.

## 2022-01-15 NOTE — Progress Notes (Signed)
[facility]  PROGRESS NOTE   Subjective:   Adam Howell is a 61 year old male with past medical conditions including diabetes, hyperlipidemia, hypertension, CHF, and end-stage renal disease on hemodialysis.  Patient presents to the emergency department with leg pain and will be admitted for Wound infection [T14.8XXA, L08.9] Diabetic ulcer of right lower leg ([place]) [K53.976, B34.193]  Patient is known to our practice from previous admissions and receives outpatient dialysis treatments at [facility] on a MWF schedule, supervised by [facility] physicians.    Patient seen resting quietly in bed, alert and oriented No family at bedside Dialysis received yesterday, tolerated well Continues to complain of pain from right leg   Objective:  Vital signs: Blood pressure 114/66, pulse 64, temperature 98.5 F (36.9 C), resp. rate 17, height 6\' 3"  (1.905 m), weight 114.3 kg, SpO2 100 %.  Intake/Output Summary (Last 24 hours) at 01/15/2022 1236 Last data filed at 01/14/2022 2154 Gross per 24 hour  Intake 60 ml  Output 0 ml  Net 60 ml    Filed Weights   01/12/22 2144 01/13/22 1842 01/14/22 1105  Weight: 117.9 kg 117.9 kg 114.3 kg     Physical Exam: General:  No acute distress  Head:  Normocephalic, atraumatic. Moist oral mucosal membranes  Eyes:  Anicteric  Lungs:   Clear to auscultation, normal effort  Heart:  S1S2 no rubs  Abdomen:   Soft, nontender, bowel sounds present  Extremities: 1-2+ peripheral edema.  Neurologic:  Awake, alert, following commands  Skin: Ulcer on the right leg  Access: Left aVF    Basic Metabolic Panel: Recent Labs  Lab 01/12/22 2148 01/14/22 0902 01/15/22 0922  NA 134* 130*  130* 131*  K 5.1 5.1  5.0 4.7  CL 91* 88*  89* 91*  CO2 28 26  25 27   GLUCOSE 80 123*  119* 103*  BUN 46* 75*  74* 51*  CREATININE 10.50* 13.07*  12.96* 9.80*  CALCIUM 9.0 8.4*  8.4* 8.0*  PHOS  --  8.7* 6.4*     CBC: Recent Labs  Lab  01/12/22 2148 01/14/22 0902  WBC 5.7 6.7  NEUTROABS 3.8 4.6  HGB 15.8 14.4  HCT 47.4 43.5  MCV 85.9 86.0  PLT 235 238      Urinalysis: No results for input(s): "COLORURINE", "LABSPEC", "PHURINE", "GLUCOSEU", "HGBUR", "BILIRUBINUR", "KETONESUR", "PROTEINUR", "UROBILINOGEN", "NITRITE", "LEUKOCYTESUR" in the last 72 hours.  Invalid input(s): "APPERANCEUR"    Imaging: No results found.   Medications:    anticoagulant sodium citrate     cefTRIAXone (ROCEPHIN)  IV 2 g (01/15/22 0537)   vancomycin Stopped (01/14/22 1650)     amiodarone  200 mg Oral BID   apixaban  2.5 mg Oral BID   bumetanide  1 mg Oral BID   carvedilol  3.125 mg Oral BID   Chlorhexidine Gluconate Cloth  6 each Topical Q0600   gabapentin  300 mg Oral BID   isosorbide mononitrate  30 mg Oral Daily   losartan  100 mg Oral Daily   NIFEdipine  30 mg Oral Daily   rosuvastatin  40 mg Oral Daily   sertraline  50 mg Oral Daily   sevelamer carbonate  1,600 mg Oral TID WC   silver sulfADIAZINE  1 Application Topical Daily    Assessment/ Plan:     Principal Problem:   Ulcer of right lower extremity (HCC) Active Problems:   Type 2 diabetes mellitus with ESRD (end-stage renal disease) ([place])   Heart failure with  reduced ejection fraction [facility])   Atrial fibrillation ([place])  61 year old male with hypertension, neuropathy, atrial fibrillation on Eliquis and amiodarone, insulin-dependent diabetes mellitus, insomnia, who presents to the emergency department for chief concerns of worsening right lower extremity wound.  [facility] /MWF/left aVF   #1: ESRD on hemodialysis.  Will maintain outpatient schedule if possible.  Patient received dialysis yesterday, UF 0.  Next dialysis treatment scheduled for Friday.   #2: Second hyperparathyroidism: Phosphorus remains elevated but slowly improving.  Receiving sevelamer with meals.   #3: Hypertension with chronic kidney disease: Receiving Bumex, nifedipine,  losartan, isosorbide and carvedilol.  All currently prescribed.  Blood pressure 114/66, stable for this patient  #4: Congestive heart failure:  2D echo 01/10/2020-LVEF 35 to 40%, global hypokinesis, severe concentric LVH Receiving Bumex 1 mg twice daily   LOS: 2 [facility] 11/2/202312:36 PM

## 2022-01-15 NOTE — Plan of Care (Signed)

## 2022-01-15 NOTE — Consult Note (Signed)
[facility] Consult Note  MRN : [id number]  Adam Howell is a 61 y.o. ([birth year]) male who presents with chief complaint of  Chief Complaint  Patient presents with   Leg Pain  .   Consulting Physician:Natalie Sheppard Coil, MD Reason for consult: Diabetic ulceration History of Present Illness: Adam Howell is a 61 year old male with a previous medical history significant for diabetes mellitus, end-stage renal disease, who was admitted on 01/13/2022 for a nonhealing right lower extremity wound.  He was previously hospitalized on 01/07/2022 for the same issue.  At this admission he has been having worsening pain and purulence from the right lower extremity ulceration despite taking the prescribed antibiotics.  The patient had ABIs performed at his previous hospitalization which showed no significant arterial disease.  No evidence of DVT noted. Current Facility-Administered Medications  Medication Dose Route Frequency Provider Last Rate Last Admin   alteplase (CATHFLO ACTIVASE) injection 2 mg  2 mg Intracatheter Once PRN Colon Flattery, NP       amiodarone (PACERONE) tablet 200 mg  200 mg Oral BID Agbata, Tochukwu, MD   200 mg at 01/15/22 2057   anticoagulant sodium citrate solution 5 mL  5 mL Intracatheter PRN Colon Flattery, NP       apixaban (ELIQUIS) tablet 2.5 mg  2.5 mg Oral BID Agbata, Tochukwu, MD   2.5 mg at 01/15/22 2100   bumetanide (BUMEX) tablet 1 mg  1 mg Oral BID Agbata, Tochukwu, MD   1 mg at 01/15/22 0815   carvedilol (COREG) tablet 3.125 mg  3.125 mg Oral BID Agbata, Tochukwu, MD   3.125 mg at 01/15/22 2057   cefTRIAXone (ROCEPHIN) 2 g in sodium chloride 0.9 % 100 mL IVPB  2 g Intravenous Q24H Agbata, Tochukwu, MD 200 mL/hr at 01/15/22 0537 2 g at 01/15/22 0537   Chlorhexidine Gluconate Cloth 2 % PADS 6 each  6 each Topical Q0600 Colon Flattery, NP   6 each at 01/14/22 0705   gabapentin (NEURONTIN) capsule 300 mg  300 mg Oral  BID Agbata, Tochukwu, MD   300 mg at 01/15/22 2057   heparin injection 1,000 Units  1,000 Units Intracatheter PRN Colon Flattery, NP       HYDROcodone-acetaminophen (NORCO) 10-325 MG per tablet 1 tablet  1 tablet Oral Q4H PRN Emeterio Reeve, DO   1 tablet at 01/15/22 2058   isosorbide mononitrate (IMDUR) 24 hr tablet 30 mg  30 mg Oral Daily Agbata, Tochukwu, MD   30 mg at 01/15/22 0814   lidocaine (PF) (XYLOCAINE) 1 % injection 5 mL  5 mL Intradermal PRN Colon Flattery, NP       lidocaine-prilocaine (EMLA) cream 1 Application  1 Application Topical PRN Colon Flattery, NP       losartan (COZAAR) tablet 100 mg  100 mg Oral Daily Agbata, Tochukwu, MD   100 mg at 01/15/22 0814   NIFEdipine (PROCARDIA-XL/NIFEDICAL-XL) 24 hr tablet 30 mg  30 mg Oral Daily Agbata, Tochukwu, MD   30 mg at 01/15/22 0815   ondansetron (ZOFRAN) tablet 4 mg  4 mg Oral Q6H PRN Agbata, Tochukwu, MD       Or   ondansetron (ZOFRAN) injection 4 mg  4 mg Intravenous Q6H PRN Agbata, Tochukwu, MD       pentafluoroprop-tetrafluoroeth (GEBAUERS) aerosol 1 Application  1 Application Topical PRN Breeze, Benancio Deeds, NP       rosuvastatin (CRESTOR) tablet 40 mg  40 mg Oral Daily Agbata, Tochukwu, MD   40  mg at 01/15/22 0814   sertraline (ZOLOFT) tablet 50 mg  50 mg Oral Daily Agbata, Tochukwu, MD   50 mg at 01/15/22 4332   sevelamer carbonate (RENVELA) tablet 1,600 mg  1,600 mg Oral TID WC Agbata, Tochukwu, MD   1,600 mg at 01/15/22 1734   silver sulfADIAZINE (SILVADENE) 1 % cream 1 Application  1 Application Topical Daily Agbata, Tochukwu, MD   1 Application at 95/18/84 0813   tiZANidine (ZANAFLEX) tablet 2 mg  2 mg Oral Daily PRN Agbata, Tochukwu, MD       traZODone (DESYREL) tablet 50 mg  50 mg Oral QHS PRN Agbata, Tochukwu, MD   50 mg at 01/15/22 2058   vancomycin (VANCOCIN) IVPB 1000 mg/200 mL premix  1,000 mg Intravenous Q M,W,F-HD Dallie Piles, [place] at 01/14/22 1650    Past Medical History:  Diagnosis Date    Chronic kidney disease    Congestive heart failure ([place])    Diabetes mellitus without complication ([place])    Hyperlipidemia    Hypertension     Past Surgical History:  Procedure Laterality Date   WOUND DEBRIDEMENT Left     Social History Social History   Tobacco Use   Smoking status: Never   Smokeless tobacco: Never  Vaping Use   Vaping Use: Never used  Substance Use Topics   Alcohol use: Not Currently   Drug use: Never    Family History Family History  Problem Relation Age of Onset   Heart failure Mother    Cirrhosis Father    Hypertension Daughter     Allergies  Allergen Reactions   Tramadol Rash     REVIEW OF SYSTEMS (Negative unless checked)  Constitutional: [] Weight loss  [] Fever  [] Chills Cardiac: [] Chest pain   [] Chest pressure   [] Palpitations   [] Shortness of breath when laying flat   [] Shortness of breath at rest   [] Shortness of breath with exertion. Vascular:  [] Pain in legs with walking   [] Pain in legs at rest   [] Pain in legs when laying flat   [] Claudication   [] Pain in feet when walking  [] Pain in feet at rest  [] Pain in feet when laying flat   [] History of DVT   [] Phlebitis   [] Swelling in legs   [] Varicose veins   [] Non-healing ulcers Pulmonary:   [] Uses home oxygen   [] Productive cough   [] Hemoptysis   [] Wheeze  [] COPD   [] Asthma Neurologic:  [] Dizziness  [] Blackouts   [] Seizures   [] History of stroke   [] History of TIA  [] Aphasia   [] Temporary blindness   [] Dysphagia   [] Weakness or numbness in arms   [] Weakness or numbness in legs Musculoskeletal:  [] Arthritis   [] Joint swelling   [] Joint pain   [] Low back pain Hematologic:  [] Easy bruising  [] Easy bleeding   [] Hypercoagulable state   [] Anemic  [] Hepatitis Gastrointestinal:  [] Blood in stool   [] Vomiting blood  [] Gastroesophageal reflux/heartburn   [] Difficulty swallowing. Genitourinary:  [] Chronic kidney disease   [] Difficult urination  [] Frequent urination  [] Burning with urination   [] Blood  in urine Skin:  [] Rashes   [] Ulcers   [x] Wounds Psychological:  [] History of anxiety   []  History of major depression.  Physical Examination  Vitals:   01/14/22 1627 01/14/22 2153 01/15/22 0529 01/15/22 0738  BP: 116/62 109/63 105/63 114/66  Pulse: 75 62 71 64  Resp: 18 17 17 17   Temp: (!) 97.4 F (36.3 C) 97.6 F (36.4 C) 97.8 F (36.6 C)  98.5 F (36.9 C)  TempSrc:      SpO2: 99% 96% 97% 100%  Weight:      Height:       Body mass index is 31.5 kg/m. Gen:  WD/WN, NAD Head: [place]/AT, No temporalis wasting. Prominent temp pulse not noted. Ear/Nose/Throat: Hearing grossly intact, nares w/o erythema or drainage, oropharynx w/o Erythema/Exudate Eyes: Sclera non-icteric, conjunctiva clear Neck: Trachea midline.  No JVD.  Pulmonary:  Good air movement, respirations not labored, equal bilaterally.  Cardiac: RRR, normal S1, S2. Vascular: Bilateral feet warm  Gastrointestinal: soft, non-tender/non-distended. No guarding/reflex.  Musculoskeletal: M/S 5/5 throughout.  Extremities without ischemic changes.  No deformity or atrophy. No edema. Neurologic: Sensation grossly intact in extremities.  Symmetrical.  Speech is fluent. Motor exam as listed above. Psychiatric: Judgment intact, Mood & affect appropriate for pt's clinical situation. Dermatologic: Large purulent foul-smelling ulceration on right shin area Lymph : No Cervical, Axillary, or Inguinal lymphadenopathy.    CBC Lab Results  Component Value Date   WBC 6.7 01/14/2022   HGB 14.4 01/14/2022   HCT 43.5 01/14/2022   MCV 86.0 01/14/2022   PLT 238 01/14/2022    BMET    Component Value Date/Time   NA 131 (L) 01/15/2022 0922   NA 136 08/25/2021 1358   K 4.7 01/15/2022 0922   CL 91 (L) 01/15/2022 0922   CO2 27 01/15/2022 0922   GLUCOSE 103 (H) 01/15/2022 0922   BUN 51 (H) 01/15/2022 0922   BUN 22 08/25/2021 1358   CREATININE 9.80 (H) 01/15/2022 0922   CALCIUM 8.0 (L) 01/15/2022 0922   GFRNONAA 6 (L) 01/15/2022 0922    Estimated Creatinine Clearance: 10.9 mL/min (A) (by C-G formula based on SCr of 9.8 mg/dL (H)).  COAG No results found for: "INR", "PROTIME"  Radiology US Venous Img Lower Unilateral Right  Result Date: 01/13/2022 CLINICAL DATA:  RIGHT lower extremity pain and swelling, RIGHT leg wound, question deep venous thrombosis EXAM: RIGHT LOWER EXTREMITY VENOUS DOPPLER ULTRASOUND TECHNIQUE: Gray-scale sonography with compression, as well as color and duplex ultrasound, were performed to evaluate the deep venous system(s) from the level of the common femoral vein through the popliteal and proximal calf veins. COMPARISON:  None Available. FINDINGS: VENOUS Normal compressibility of the common femoral, superficial femoral, and popliteal veins, as well as the visualized calf veins. Visualized portions of profunda femoral vein and great saphenous vein unremarkable. No filling defects to suggest DVT on grayscale or color Doppler imaging. Doppler waveforms show normal direction of venous flow, normal respiratory plasticity and response to augmentation. Limited views of the contralateral common femoral vein are unremarkable. OTHER None. Limitations: none IMPRESSION: No evidence of deep venous thrombosis in the RIGHT lower extremity. Electronically Signed   By: Lavonia Dana M.D.   On: 01/13/2022 08:32   US ARTERIAL ABI (SCREENING LOWER EXTREMITY)  Result Date: 01/02/2022 CLINICAL DATA:  Limb ischemia, diabetic foot ulcer EXAM: NONINVASIVE PHYSIOLOGIC VASCULAR STUDY OF BILATERAL LOWER EXTREMITIES TECHNIQUE: Evaluation of both lower extremities were performed at rest, including calculation of ankle-brachial indices with single level Doppler, pressure and pulse volume recording. COMPARISON:  None Available. FINDINGS: Right ABI:  1.02 Left ABI:  0.93 Right Lower Extremity:  Normal arterial waveforms at the ankle. Left Lower Extremity:  Normal arterial waveforms at the ankle. 1.0-1.4 Normal IMPRESSION: Normal resting ABIs  Electronically Signed   By: Jerilynn Mages.  Shick M.D.   On: 01/02/2022 15:08   DG Tibia/Fibula Right  Result Date: 01/02/2022 CLINICAL DATA:  Nonhealing ulcer  EXAM: RIGHT TIBIA AND FIBULA - 2 VIEW COMPARISON:  None Available. FINDINGS: No fracture or dislocation is seen. There are no focal lytic lesions. There is no effusion in the right knee. Fairly extensive arterial calcifications are seen in soft tissues. There is edema in subcutaneous plane. Plantar spur is seen in calcaneus. IMPRESSION: No fracture or dislocation is seen. There are no focal lytic lesions. Electronically Signed   By: Elmer Picker M.D.   On: 01/02/2022 13:00      Assessment/Plan 1.  Diabetic ulcer   The wound currently has slough in the wound bed and would benefit from debridement to help with wound healing.  Based upon the size and location I do not believe this is a venous ulceration.  Based on this we recommend continued follow-up care with the wound center on an outpatient basis to ensure the patient has the best resources for wound healing.  We will continue to follow while inpatient.  We will plan for debridement on 01/16/2022   Plan of care discussed with Dr.Schnier and he is in agreement with plan noted above.   Family Communication:  Total Time:75 minutes I spent 75 minutes in this encounter including personally reviewing extensive medical records, personally reviewing imaging studies and compared to prior scans, counseling the patient, placing orders, coordinating care and performing appropriate documentation  Thank you for allowing [place] to participate in the care of this patient.   Kris Hartmann, NP [facility] [phone] (Office Phone) [phone] (Office Fax) [phone] (Pager)  01/15/2022 10:31 PM  Staff may message me via secure chat in [place]  but this may not receive immediate response,  please page for urgent matters!  Dictation software was used to generate the above  note. Typos may occur and escape review, as with typed/written notes. Any error is purely unintentional.  Please contact me directly for clarity if needed.

## 2022-01-15 NOTE — Discharge Planning (Addendum)
ESTABLISHED HEMODIALYSIS PATIENT:   Outpatient facility:  [facility]  [address], [place] [address] [phone]   Treatment days: Monday, Wednesday and Friday   Chair time: 07:00am  Confirmed schedule with Neoma Laming RN, patient rides with [employer]. Met with patient, no dialysis concerns stated.   Elvera Bicker Dialysis Coordinator [phone]

## 2022-01-16 ENCOUNTER — Telehealth: Payer: Self-pay | Admitting: Nurse Practitioner

## 2022-01-16 ENCOUNTER — Other Ambulatory Visit: Payer: Self-pay

## 2022-01-16 ENCOUNTER — Inpatient Hospital Stay: Payer: Medicare Other | Admitting: Anesthesiology

## 2022-01-16 ENCOUNTER — Encounter
Admission: EM | Disposition: A | Discharge status: Home Health | Payer: Self-pay | Source: Home / Self Care | Attending: Osteopathic Medicine

## 2022-01-16 DIAGNOSIS — I96 Gangrene, not elsewhere classified: Secondary | ICD-10-CM

## 2022-01-16 DIAGNOSIS — L97912 Non-pressure chronic ulcer of unspecified part of right lower leg with fat layer exposed: Secondary | ICD-10-CM | POA: Diagnosis not present

## 2022-01-16 DIAGNOSIS — E1122 Type 2 diabetes mellitus with diabetic chronic kidney disease: Secondary | ICD-10-CM | POA: Diagnosis not present

## 2022-01-16 DIAGNOSIS — L97818 Non-pressure chronic ulcer of other part of right lower leg with other specified severity: Secondary | ICD-10-CM

## 2022-01-16 DIAGNOSIS — I5020 Unspecified systolic (congestive) heart failure: Secondary | ICD-10-CM | POA: Diagnosis not present

## 2022-01-16 DIAGNOSIS — I10 Essential (primary) hypertension: Secondary | ICD-10-CM

## 2022-01-16 DIAGNOSIS — I4811 Longstanding persistent atrial fibrillation: Secondary | ICD-10-CM | POA: Diagnosis not present

## 2022-01-16 DIAGNOSIS — L089 Local infection of the skin and subcutaneous tissue, unspecified: Secondary | ICD-10-CM

## 2022-01-16 DIAGNOSIS — I4891 Unspecified atrial fibrillation: Secondary | ICD-10-CM

## 2022-01-16 HISTORY — PX: WOUND DEBRIDEMENT: SHX247

## 2022-01-16 LAB — GLUCOSE, CAPILLARY
Glucose-Capillary: 92 mg/dL (ref 70–99)
Glucose-Capillary: 96 mg/dL (ref 70–99)
Glucose-Capillary: 129 mg/dL — ABNORMAL HIGH (ref 70–99)

## 2022-01-16 LAB — TYPE AND SCREEN
ABO/RH(D): A POS
Antibody Screen: NEGATIVE

## 2022-01-16 SURGERY — DEBRIDEMENT, WOUND
Anesthesia: General | Laterality: Right

## 2022-01-16 MED ORDER — FENTANYL CITRATE (PF) 100 MCG/2ML IJ SOLN: 25.0000 ug | INTRAMUSCULAR | Status: DC | PRN

## 2022-01-16 MED ORDER — ACETAMINOPHEN 10 MG/ML IV SOLN: 1000.0000 mg | Freq: Once | INTRAVENOUS | Status: DC | PRN

## 2022-01-16 MED ORDER — CHLORHEXIDINE GLUCONATE CLOTH 2 % EX PADS: 6.0000 | MEDICATED_PAD | Freq: Once | CUTANEOUS | Status: AC

## 2022-01-16 MED ORDER — APIXABAN 2.5 MG PO TABS
2.5000 mg | ORAL_TABLET | Freq: Two times a day (BID) | ORAL | Status: DC
  Administered 2022-01-16 – 2022-01-18 (×5): 2.5 mg via ORAL
  Filled 2022-01-16 (×5): qty 1

## 2022-01-16 MED ORDER — SODIUM CHLORIDE 0.9 % IV SOLN: INTRAVENOUS | Status: DC | PRN

## 2022-01-16 MED ORDER — SODIUM CHLORIDE 0.9 % IR SOLN
Status: DC | PRN
  Administered 2022-01-16: 500 mL

## 2022-01-16 MED ORDER — HYDROMORPHONE HCL 1 MG/ML IJ SOLN
1.0000 mg | Freq: Once | INTRAMUSCULAR | Status: AC | PRN
  Administered 2022-01-16: 1 mg via INTRAVENOUS
  Filled 2022-01-16: qty 1

## 2022-01-16 MED ORDER — ONDANSETRON HCL 4 MG/2ML IJ SOLN
INTRAMUSCULAR | Status: DC | PRN
  Administered 2022-01-16: 4 mg via INTRAVENOUS

## 2022-01-16 MED ORDER — SODIUM CHLORIDE 0.9 % IV SOLN
3.0000 g | Freq: Two times a day (BID) | INTRAVENOUS | Status: DC
  Administered 2022-01-16 – 2022-01-18 (×4): 3 g via INTRAVENOUS
  Filled 2022-01-16 (×3): qty 8
  Filled 2022-01-16: qty 3
  Filled 2022-01-16 (×2): qty 8

## 2022-01-16 MED ORDER — OXYCODONE HCL 5 MG PO TABS: 5.0000 mg | ORAL_TABLET | Freq: Once | ORAL | Status: DC | PRN

## 2022-01-16 MED ORDER — LIDOCAINE HCL (CARDIAC) PF 100 MG/5ML IV SOSY
PREFILLED_SYRINGE | INTRAVENOUS | Status: DC | PRN
  Administered 2022-01-16: 80 mg via INTRAVENOUS

## 2022-01-16 MED ORDER — EPHEDRINE SULFATE (PRESSORS) 50 MG/ML IJ SOLN
INTRAMUSCULAR | Status: DC | PRN
  Administered 2022-01-16 (×2): 10 mg via INTRAVENOUS
  Administered 2022-01-16: 5 mg via INTRAVENOUS

## 2022-01-16 MED ORDER — PHENYLEPHRINE HCL-NACL 20-0.9 MG/250ML-% IV SOLN
INTRAVENOUS | Status: DC | PRN
  Administered 2022-01-16: 50 ug/min via INTRAVENOUS

## 2022-01-16 MED ORDER — LIDOCAINE-EPINEPHRINE (PF) 1 %-1:200000 IJ SOLN
INTRAMUSCULAR | Status: DC | PRN
  Administered 2022-01-16: 30 mL via INTRAMUSCULAR

## 2022-01-16 MED ORDER — LIDOCAINE-EPINEPHRINE (PF) 1 %-1:200000 IJ SOLN
INTRAMUSCULAR | Status: AC
  Filled 2022-01-16: qty 30

## 2022-01-16 MED ORDER — FENTANYL CITRATE (PF) 100 MCG/2ML IJ SOLN
INTRAMUSCULAR | Status: DC | PRN
  Administered 2022-01-16: 25 ug via INTRAVENOUS

## 2022-01-16 MED ORDER — EPHEDRINE 5 MG/ML INJ
INTRAVENOUS | Status: AC
  Filled 2022-01-16: qty 5

## 2022-01-16 MED ORDER — CEFAZOLIN SODIUM-DEXTROSE 2-4 GM/100ML-% IV SOLN
2.0000 g | INTRAVENOUS | Status: AC
  Administered 2022-01-16: 2 g via INTRAVENOUS

## 2022-01-16 MED ORDER — CEFAZOLIN SODIUM-DEXTROSE 2-4 GM/100ML-% IV SOLN
INTRAVENOUS | Status: AC
  Filled 2022-01-16: qty 100

## 2022-01-16 MED ORDER — ONDANSETRON HCL 4 MG/2ML IJ SOLN: 4.0000 mg | Freq: Once | INTRAMUSCULAR | Status: DC | PRN

## 2022-01-16 MED ORDER — CHLORHEXIDINE GLUCONATE CLOTH 2 % EX PADS
6.0000 | MEDICATED_PAD | Freq: Once | CUTANEOUS | Status: AC
  Administered 2022-01-16: 6 via TOPICAL

## 2022-01-16 MED ORDER — LACTATED RINGERS IV SOLN: INTRAVENOUS | Status: DC

## 2022-01-16 MED ORDER — PROPOFOL 10 MG/ML IV BOLUS
INTRAVENOUS | Status: DC | PRN
  Administered 2022-01-16: 140 mg via INTRAVENOUS
  Administered 2022-01-16: 20 mg via INTRAVENOUS

## 2022-01-16 MED ORDER — FENTANYL CITRATE (PF) 100 MCG/2ML IJ SOLN
INTRAMUSCULAR | Status: AC
  Filled 2022-01-16: qty 2

## 2022-01-16 MED ORDER — PHENYLEPHRINE HCL-NACL 20-0.9 MG/250ML-% IV SOLN
INTRAVENOUS | Status: AC
  Filled 2022-01-16: qty 250

## 2022-01-16 MED ORDER — PHENYLEPHRINE 80 MCG/ML (10ML) SYRINGE FOR IV PUSH (FOR BLOOD PRESSURE SUPPORT)
PREFILLED_SYRINGE | INTRAVENOUS | Status: AC
  Filled 2022-01-16: qty 10

## 2022-01-16 MED ORDER — INSULIN ASPART 100 UNIT/ML IJ SOLN: 0.0000 [IU] | Freq: Three times a day (TID) | INTRAMUSCULAR | Status: DC

## 2022-01-16 MED ORDER — OXYCODONE HCL 5 MG/5ML PO SOLN: 5.0000 mg | Freq: Once | ORAL | Status: DC | PRN

## 2022-01-16 MED ORDER — ONDANSETRON HCL 4 MG/2ML IJ SOLN: 4.0000 mg | Freq: Four times a day (QID) | INTRAMUSCULAR | Status: DC | PRN

## 2022-01-16 MED ORDER — PHENYLEPHRINE HCL (PRESSORS) 10 MG/ML IV SOLN
INTRAVENOUS | Status: DC | PRN
  Administered 2022-01-16: 160 ug via INTRAVENOUS
  Administered 2022-01-16 (×2): 80 ug via INTRAVENOUS
  Administered 2022-01-16: 160 ug via INTRAVENOUS

## 2022-01-16 SURGICAL SUPPLY — 34 items
BNDG COHESIVE 6X5 TAN ST LF (GAUZE/BANDAGES/DRESSINGS) ×1 IMPLANT
CANISTER WOUND CARE 500ML ATS (WOUND CARE) ×1 IMPLANT
CHLORAPREP W/TINT 26 (MISCELLANEOUS) ×1 IMPLANT
DRAPE EXTREMITY 106X87X128.5 (DRAPES) IMPLANT
DRAPE INCISE IOBAN 66X45 STRL (DRAPES) ×1 IMPLANT
DRSG EMULSION OIL 3X3 NADH (GAUZE/BANDAGES/DRESSINGS) ×1 IMPLANT
DRSG VAC ATS LRG SENSATRAC (GAUZE/BANDAGES/DRESSINGS) ×1 IMPLANT
DRSG VAC ATS MED SENSATRAC (GAUZE/BANDAGES/DRESSINGS) ×1 IMPLANT
ELECT REM PT RETURN 9FT ADLT (ELECTROSURGICAL) ×1
ELECTRODE REM PT RTRN 9FT ADLT (ELECTROSURGICAL) ×1 IMPLANT
GAUZE SPONGE 4X4 12PLY STRL (GAUZE/BANDAGES/DRESSINGS) IMPLANT
GAUZE STRETCH 2X75IN STRL (MISCELLANEOUS) IMPLANT
GLOVE BIO SURGEON STRL SZ7 (GLOVE) ×1 IMPLANT
GLOVE SURG SYN 7.0 (GLOVE) ×1 IMPLANT
GLOVE SURG SYN 7.0 PF PI (GLOVE) ×1 IMPLANT
GLOVE SURG SYN 8.0 (GLOVE) ×1 IMPLANT
GLOVE SURG SYN 8.0 PF PI (GLOVE) ×1 IMPLANT
GOWN STRL REUS W/ TWL LRG LVL3 (GOWN DISPOSABLE) ×3 IMPLANT
GOWN STRL REUS W/ TWL XL LVL3 (GOWN DISPOSABLE) ×1 IMPLANT
GOWN STRL REUS W/TWL LRG LVL3 (GOWN DISPOSABLE) ×2
GOWN STRL REUS W/TWL XL LVL3 (GOWN DISPOSABLE) ×1
HANDPIECE VERSAJET DEBRIDEMENT (MISCELLANEOUS) IMPLANT
IV SODIUM CHL 0.9% 500ML (IV SOLUTION) IMPLANT
KIT TURNOVER KIT A (KITS) ×1 IMPLANT
LABEL OR SOLS (LABEL) ×1 IMPLANT
MANIFOLD NEPTUNE II (INSTRUMENTS) ×1 IMPLANT
NS IRRIG 500ML POUR BTL (IV SOLUTION) ×1 IMPLANT
PACK EXTREMITY ARMC (MISCELLANEOUS) ×1 IMPLANT
PAD PREP 24X41 OB/GYN DISP (PERSONAL CARE ITEMS) ×1 IMPLANT
SOL PREP PVP 2OZ (MISCELLANEOUS) ×1
SOLUTION PREP PVP 2OZ (MISCELLANEOUS) ×1 IMPLANT
STOCKINETTE IMPERV 14X48 (MISCELLANEOUS) ×1 IMPLANT
TRAP FLUID SMOKE EVACUATOR (MISCELLANEOUS) ×1 IMPLANT
WATER STERILE IRR 500ML POUR (IV SOLUTION) ×1 IMPLANT

## 2022-01-16 NOTE — Progress Notes (Signed)
Post hd rn assessment 

## 2022-01-16 NOTE — Telephone Encounter (Signed)
Called Cindy from [facility] to provide verbal OK for patient and to notify their company that patient is currently admitted in the hospital. Unable to leave a message for Jenny Reichmann due to voicemail full.   OK for [facility] to give note if [place] calls back.

## 2022-01-16 NOTE — Care Management Important Message (Signed)
Important Message  Patient Details  Name: Darcel Frane MRN: [id number] Date of Birth: [birth year]   Medicare Important Message Given:  Yes     Juliann Pulse A Diamonique Ruedas 01/16/2022, 1:43 PM

## 2022-01-16 NOTE — Interval H&P Note (Signed)
History and Physical Interval Note:  01/16/2022 10:06 AM  Adam Howell  has presented today for surgery, with the diagnosis of Non healing wound.  The various methods of treatment have been discussed with the patient and family. After consideration of risks, benefits and other options for treatment, the patient has consented to  Procedure(s): DEBRIDEMENT WOUND (Right) as a surgical intervention.  The patient's history has been reviewed, patient examined, no change in status, stable for surgery.  I have reviewed the patient's chart and labs.  Questions were answered to the patient's satisfaction.     Hortencia Pilar

## 2022-01-16 NOTE — Consult Note (Signed)
[facility]                                                Consultation   MRN : [id number]  Adam Howell is a 61 y.o. (April 04, 1960) male who presents with chief complaint of check circulation.  History of Present Illness:   I am asked to evaluate the patient by Dr. Sheppard Coil.  Patient is a 61 year old gentleman admitted to [facility] 3 days ago with a chief complaint of right leg pain.  Patient has a past medical history significant for diabetes mellitus with complications of end-stage renal disease on hemodialysis (M/W/F), history of A-fib on chronic anticoagulation therapy, chronic pain syndrome, secondary hyperparathyroidism who was recently admitted and discharged from the hospital on 01/07/22 for a nonhealing right lower extremity wound.  Wound culture from his right lower extremity yielded Proteus mirabilis and Klebsiella oxytoca and patient was discharged on Keflex.  He returns to the emergency room approximately 6 days after being discharged secondary to increasing right leg pain and swelling with increased purulent discharge despite taking his antibiotics as prescribed.  The patient notes the ulcer has been present for multiple weeks and has not been improving.  It is very painful and has had some drainage.  No specific history of trauma noted by the patient.  The patient denies fever or chills.  the patient does have diabetes which has been difficult to control.  The patient denies rest pain or dangling of an extremity off the side of the bed during the night for relief. No prior interventions or surgeries.  No history of back problems or DJD of the lumbar sacral spine.   The patient denies amaurosis fugax or recent TIA symptoms. There are no recent neurological changes noted. The patient denies history of DVT, PE or superficial thrombophlebitis. The patient denies recent episodes of angina or shortness of breath.   ABIs  performed 01/02/2022 right equals 1.02 and left equals 0.93.  There are triphasic waveforms at the ankles with normal toe brachial indices.  Venous duplex right lower extremity obtained 01/13/2022 is normal   Current Meds  Medication Sig   HYDROcodone-acetaminophen (NORCO) 10-325 MG tablet Take 1 tablet by mouth every 8 (eight) hours as needed.    Past Medical History:  Diagnosis Date   Chronic kidney disease    Congestive heart failure ([place])    Diabetes mellitus without complication ([place])    Hyperlipidemia    Hypertension     Past Surgical History:  Procedure Laterality Date   WOUND DEBRIDEMENT Left     Social History Social History   Tobacco Use   Smoking status: Never   Smokeless tobacco: Never  Vaping Use   Vaping Use: Never used  Substance Use Topics   Alcohol use: Not Currently   Drug use: Never    Family History Family History  Problem Relation Age of Onset   Heart failure Mother    Cirrhosis Father    Hypertension Daughter     Allergies  Allergen Reactions   Tramadol Rash     REVIEW OF SYSTEMS (Negative unless checked)  Constitutional: [] Weight loss  [] Fever  [] Chills Cardiac: [] Chest pain   [] Chest pressure   [] Palpitations   [] Shortness of breath when laying flat   [] Shortness of breath with exertion.  Vascular:  [x] Pain in legs with walking   [] Pain in legs at rest  [] History of DVT   [] Phlebitis   [] Swelling in legs   [] Varicose veins   [] Non-healing ulcers Pulmonary:   [] Uses home oxygen   [] Productive cough   [] Hemoptysis   [] Wheeze  [] COPD   [] Asthma Neurologic:  [] Dizziness   [] Seizures   [] History of stroke   [] History of TIA  [] Aphasia   [] Vissual changes   [] Weakness or numbness in arm   [] Weakness or numbness in leg Musculoskeletal:   [] Joint swelling   [] Joint pain   [] Low back pain Hematologic:  [] Easy bruising  [] Easy bleeding   [] Hypercoagulable state   [] Anemic Gastrointestinal:  [] Diarrhea   [] Vomiting  [] Gastroesophageal  reflux/heartburn   [] Difficulty swallowing. Genitourinary:  [] Chronic kidney disease   [] Difficult urination  [] Frequent urination   [] Blood in urine Skin:  [] Rashes   [] Ulcers  Psychological:  [] History of anxiety   []  History of major depression.  Physical Examination  Vitals:   01/15/22 0529 01/15/22 0738 01/16/22 0100 01/16/22 0845  BP: 105/63 114/66 123/62 127/77  Pulse: 71 64 76   Resp: 17 17 17 20   Temp: 97.8 F (36.6 C) 98.5 F (36.9 C) 98.3 F (36.8 C) 98.3 F (36.8 C)  TempSrc:   Oral Oral  SpO2: 97% 100% 100% 92%  Weight:      Height:       Body mass index is 31.5 kg/m. Gen: WD/WN, NAD Head: /AT, No temporalis wasting.  Ear/Nose/Throat: Hearing grossly intact, nares w/o erythema or drainage Eyes: PER, EOMI, sclera nonicteric.  Neck: Supple, no masses.  No bruit or JVD.  Pulmonary:  Good air movement, no audible wheezing, no use of accessory muscles.  Cardiac: RRR, normal S1, S2, no Murmurs. Vascular: Large ulcer right anterior shin just above the ankle which appears infected Vessel Right Left  Radial Palpable Palpable  PT Palpable Palpable  DP Not Palpable Not Palpable  Gastrointestinal: soft, non-distended. No guarding/no peritoneal signs.  Musculoskeletal: M/S 5/5 throughout.  No visible deformity.  Neurologic: CN 2-12 intact. Pain and light touch intact in extremities.  Symmetrical.  Speech is fluent. Motor exam as listed above. Psychiatric: Judgment intact, Mood & affect appropriate for pt's clinical situation. Dermatologic: No rashes or ulcers noted.  No changes consistent with cellulitis.   CBC Lab Results  Component Value Date   WBC 6.7 01/14/2022   HGB 14.4 01/14/2022   HCT 43.5 01/14/2022   MCV 86.0 01/14/2022   PLT 238 01/14/2022    BMET    Component Value Date/Time   NA 131 (L) 01/15/2022 0922   NA 136 08/25/2021 1358   K 4.7 01/15/2022 0922   CL 91 (L) 01/15/2022 0922   CO2 27 01/15/2022 0922   GLUCOSE 103 (H) 01/15/2022 0922    BUN 51 (H) 01/15/2022 0922   BUN 22 08/25/2021 1358   CREATININE 9.80 (H) 01/15/2022 0922   CALCIUM 8.0 (L) 01/15/2022 0922   GFRNONAA 6 (L) 01/15/2022 0922   Estimated Creatinine Clearance: 10.9 mL/min (A) (by C-G formula based on SCr of 9.8 mg/dL (H)).  COAG No results found for: "INR", "PROTIME"  Radiology US Venous Img Lower Unilateral Right  Result Date: 01/13/2022 CLINICAL DATA:  RIGHT lower extremity pain and swelling, RIGHT leg wound, question deep venous thrombosis EXAM: RIGHT LOWER EXTREMITY VENOUS DOPPLER ULTRASOUND TECHNIQUE: Gray-scale sonography with compression, as well as color and duplex ultrasound, were performed to evaluate the deep venous system(s) from  the level of the common femoral vein through the popliteal and proximal calf veins. COMPARISON:  None Available. FINDINGS: VENOUS Normal compressibility of the common femoral, superficial femoral, and popliteal veins, as well as the visualized calf veins. Visualized portions of profunda femoral vein and great saphenous vein unremarkable. No filling defects to suggest DVT on grayscale or color Doppler imaging. Doppler waveforms show normal direction of venous flow, normal respiratory plasticity and response to augmentation. Limited views of the contralateral common femoral vein are unremarkable. OTHER None. Limitations: none IMPRESSION: No evidence of deep venous thrombosis in the RIGHT lower extremity. Electronically Signed   By: Lavonia Dana M.D.   On: 01/13/2022 08:32   US ARTERIAL ABI (SCREENING LOWER EXTREMITY)  Result Date: 01/02/2022 CLINICAL DATA:  Limb ischemia, diabetic foot ulcer EXAM: NONINVASIVE PHYSIOLOGIC VASCULAR STUDY OF BILATERAL LOWER EXTREMITIES TECHNIQUE: Evaluation of both lower extremities were performed at rest, including calculation of ankle-brachial indices with single level Doppler, pressure and pulse volume recording. COMPARISON:  None Available. FINDINGS: Right ABI:  1.02 Left ABI:  0.93 Right Lower  Extremity:  Normal arterial waveforms at the ankle. Left Lower Extremity:  Normal arterial waveforms at the ankle. 1.0-1.4 Normal IMPRESSION: Normal resting ABIs Electronically Signed   By: Jerilynn Mages.  Shick M.D.   On: 01/02/2022 15:08   DG Tibia/Fibula Right  Result Date: 01/02/2022 CLINICAL DATA:  Nonhealing ulcer EXAM: RIGHT TIBIA AND FIBULA - 2 VIEW COMPARISON:  None Available. FINDINGS: No fracture or dislocation is seen. There are no focal lytic lesions. There is no effusion in the right knee. Fairly extensive arterial calcifications are seen in soft tissues. There is edema in subcutaneous plane. Plantar spur is seen in calcaneus. IMPRESSION: No fracture or dislocation is seen. There are no focal lytic lesions. Electronically Signed   By: Elmer Picker M.D.   On: 01/02/2022 13:00     Assessment/Plan Infected right lower extremity ulceration: Recommend  Patient should undergo debridement in the operating room.  Repeat wound cultures can be obtained at that time.  Also tissue specimen was sent for pathology.  Risks and benefits of been reviewed all questions been answered patient agrees to proceed.  Postdebridement either a VAC will be placed or compression wrap.  2.  Atrial fibrillation: Continue antiarrhythmia medications as already ordered, these medications have been reviewed and there are no changes at this time.  Continue anticoagulation patient did take 2.5 of Eliquis last night but has not taken any this morning.  This should be acceptable for debridement today.  We will proceed.  3.  Diabetes mellitus: Continue hypoglycemic medications as already ordered, these medications have been reviewed and there are no changes at this time.  Hgb A1C to be monitored as already arranged by primary service    4.  Hypertension: Continue antihypertensive medications as already ordered, these medications have been reviewed and there are no changes at this time.   Hortencia Pilar,  MD  01/16/2022 9:56 AM

## 2022-01-16 NOTE — Progress Notes (Signed)
Pre hd rn assessment 

## 2022-01-16 NOTE — Progress Notes (Signed)
[facility]  PROGRESS NOTE   Subjective:   Adam Howell is a 61 year old male with past medical conditions including diabetes, hyperlipidemia, hypertension, CHF, and end-stage renal disease on hemodialysis.  Patient presents to the emergency department with leg pain and will be admitted for Wound infection [T14.8XXA, L08.9] Diabetic ulcer of right lower leg ([place]) [V56.433, I95.188]  Patient is known to our practice from previous admissions and receives outpatient dialysis treatments at [facility] on a MWF schedule, supervised by [facility] physicians.    Patient seen and evaluated during dialysis   HEMODIALYSIS FLOWSHEET:  Blood Flow Rate (mL/min): 350 mL/min Arterial Pressure (mmHg): -160 mmHg Venous Pressure (mmHg): 220 mmHg TMP (mmHg): -2 mmHg Ultrafiltration Rate (mL/min): 686 mL/min Dialysate Flow Rate (mL/min): 300 ml/min  Remains drowsy from surgical procedure Denies pain or discomfort   Objective:  Vital signs: Blood pressure 105/67, pulse 64, temperature 97.8 F (36.6 C), temperature source Oral, resp. rate 11, height 6\' 3"  (1.905 m), weight 114.3 kg, SpO2 98 %.  Intake/Output Summary (Last 24 hours) at 01/16/2022 1434 Last data filed at 01/16/2022 1153 Gross per 24 hour  Intake 1618.92 ml  Output 5 ml  Net 1613.92 ml    Filed Weights   01/12/22 2144 01/13/22 1842 01/14/22 1105  Weight: 117.9 kg 117.9 kg 114.3 kg     Physical Exam: General:  No acute distress  Head:  Normocephalic, atraumatic. Moist oral mucosal membranes  Eyes:  Anicteric  Lungs:   Clear to auscultation, normal effort  Heart:  S1S2 no rubs, irregular  Abdomen:   Soft, nontender, bowel sounds present  Extremities: 2+ peripheral edema.  Neurologic:  Awake, alert, following commands  Skin: Ulcer on the right leg, NPWV  Access: Left aVF    Basic Metabolic Panel: Recent Labs  Lab 01/12/22 2148 01/14/22 0902 01/15/22 0922  NA 134* 130*  130* 131*  K 5.1 5.1   5.0 4.7  CL 91* 88*  89* 91*  CO2 28 26  25 27   GLUCOSE 80 123*  119* 103*  BUN 46* 75*  74* 51*  CREATININE 10.50* 13.07*  12.96* 9.80*  CALCIUM 9.0 8.4*  8.4* 8.0*  PHOS  --  8.7* 6.4*     CBC: Recent Labs  Lab 01/12/22 2148 01/14/22 0902  WBC 5.7 6.7  NEUTROABS 3.8 4.6  HGB 15.8 14.4  HCT 47.4 43.5  MCV 85.9 86.0  PLT 235 238      Urinalysis: No results for input(s): "COLORURINE", "LABSPEC", "PHURINE", "GLUCOSEU", "HGBUR", "BILIRUBINUR", "KETONESUR", "PROTEINUR", "UROBILINOGEN", "NITRITE", "LEUKOCYTESUR" in the last 72 hours.  Invalid input(s): "APPERANCEUR"    Imaging: No results found.   Medications:    ampicillin-sulbactam (UNASYN) IV     anticoagulant sodium citrate       amiodarone  200 mg Oral BID   apixaban  2.5 mg Oral BID   bumetanide  1 mg Oral BID   carvedilol  3.125 mg Oral BID   Chlorhexidine Gluconate Cloth  6 each Topical Q0600   gabapentin  300 mg Oral BID   isosorbide mononitrate  30 mg Oral Daily   losartan  100 mg Oral Daily   NIFEdipine  30 mg Oral Daily   rosuvastatin  40 mg Oral Daily   sertraline  50 mg Oral Daily   sevelamer carbonate  1,600 mg Oral TID WC   silver sulfADIAZINE  1 Application Topical Daily    Assessment/ Plan:     Principal Problem:   Ulcer of right  lower extremity (HCC) Active Problems:   Type 2 diabetes mellitus with ESRD (end-stage renal disease) ([place])   Heart failure with reduced ejection fraction (HCC)   Atrial fibrillation (HCC)   Diabetic ulcer of right lower leg ([place])  61 year old male with hypertension, neuropathy, atrial fibrillation on Eliquis and amiodarone, insulin-dependent diabetes mellitus, insomnia, who presents to the emergency department for chief concerns of worsening right lower extremity wound.  [facility]/MWF/left aVF   #1: ESRD on hemodialysis.  Will maintain outpatient schedule if possible.  Receiving scheduled dialysis, UF goal 1 to 1.5 L as tolerated.  Next  treatment scheduled for Monday.   #2: Second hyperparathyroidism:  Receiving sevelamer with meals.  We will check labs in a.m.   #3: Hypertension with chronic kidney disease: Receiving Bumex, nifedipine, losartan, isosorbide and carvedilol.  All currently prescribed.  Blood pressure 110/61 during dialysis.  #4: Congestive heart failure:  2D echo 01/10/2020-LVEF 35 to 40%, global hypokinesis, severe concentric LVH Receiving Bumex 1 mg twice daily   LOS: 3 [facility] 11/3/20232:34 PM

## 2022-01-16 NOTE — Anesthesia Procedure Notes (Signed)
Procedure Name: LMA Insertion Date/Time: 01/16/2022 10:48 AM  Performed by: Hedda Slade, CRNAPre-anesthesia Checklist: Patient identified, Patient being monitored, Timeout performed, Emergency Drugs available and Suction available Patient Re-evaluated:Patient Re-evaluated prior to induction Oxygen Delivery Method: Circle system utilized Preoxygenation: Pre-oxygenation with 100% oxygen Induction Type: IV induction Ventilation: Mask ventilation without difficulty LMA: LMA inserted LMA Size: 5.0 Tube type: Oral Number of attempts: 1 Placement Confirmation: positive ETCO2 and breath sounds checked- equal and bilateral Tube secured with: Tape Dental Injury: Teeth and Oropharynx as per pre-operative assessment  Comments: Multiple loose teeth prior to LMA placement. Teeth remain intact as per preop assessment

## 2022-01-16 NOTE — H&P (View-Only) (Signed)
vein and vascular                                                Consultation   MRN : [id number]  Adam Howell is a 61 y.o. (1960/08/02) male who presents with chief complaint of check circulation.  History of Present Illness:   I am asked to evaluate the patient by Dr. Sheppard Coil.  Patient is a 61 year old gentleman admitted to [facility] 3 days ago with a chief complaint of right leg pain.  Patient has a past medical history significant for diabetes mellitus with complications of end-stage renal disease on hemodialysis (M/W/F), history of A-fib on chronic anticoagulation therapy, chronic pain syndrome, secondary hyperparathyroidism who was recently admitted and discharged from the hospital on 01/07/22 for a nonhealing right lower extremity wound.  Wound culture from his right lower extremity yielded Proteus mirabilis and Klebsiella oxytoca and patient was discharged on Keflex.  He returns to the emergency room approximately 6 days after being discharged secondary to increasing right leg pain and swelling with increased purulent discharge despite taking his antibiotics as prescribed.  The patient notes the ulcer has been present for multiple weeks and has not been improving.  It is very painful and has had some drainage.  No specific history of trauma noted by the patient.  The patient denies fever or chills.  the patient does have diabetes which has been difficult to control.  The patient denies rest pain or dangling of an extremity off the side of the bed during the night for relief. No prior interventions or surgeries.  No history of back problems or DJD of the lumbar sacral spine.   The patient denies amaurosis fugax or recent TIA symptoms. There are no recent neurological changes noted. The patient denies history of DVT, PE or superficial thrombophlebitis. The patient denies recent episodes of angina or shortness of breath.   ABIs  performed 01/02/2022 right equals 1.02 and left equals 0.93.  There are triphasic waveforms at the ankles with normal toe brachial indices.  Venous duplex right lower extremity obtained 01/13/2022 is normal   Current Meds  Medication Sig   HYDROcodone-acetaminophen (NORCO) 10-325 MG tablet Take 1 tablet by mouth every 8 (eight) hours as needed.    Past Medical History:  Diagnosis Date   Chronic kidney disease    Congestive heart failure ([place])    Diabetes mellitus without complication ([place])    Hyperlipidemia    Hypertension     Past Surgical History:  Procedure Laterality Date   WOUND DEBRIDEMENT Left     Social History Social History   Tobacco Use   Smoking status: Never   Smokeless tobacco: Never  Vaping Use   Vaping Use: Never used  Substance Use Topics   Alcohol use: Not Currently   Drug use: Never    Family History Family History  Problem Relation Age of Onset   Heart failure Mother    Cirrhosis Father    Hypertension Daughter     Allergies  Allergen Reactions   Tramadol Rash     REVIEW OF SYSTEMS (Negative unless checked)  Constitutional: [] Weight loss  [] Fever  [] Chills Cardiac: [] Chest pain   [] Chest pressure   [] Palpitations   [] Shortness of breath when laying flat   [] Shortness of breath with exertion.  Vascular:  [x] Pain in legs with walking   [] Pain in legs at rest  [] History of DVT   [] Phlebitis   [] Swelling in legs   [] Varicose veins   [] Non-healing ulcers Pulmonary:   [] Uses home oxygen   [] Productive cough   [] Hemoptysis   [] Wheeze  [] COPD   [] Asthma Neurologic:  [] Dizziness   [] Seizures   [] History of stroke   [] History of TIA  [] Aphasia   [] Vissual changes   [] Weakness or numbness in arm   [] Weakness or numbness in leg Musculoskeletal:   [] Joint swelling   [] Joint pain   [] Low back pain Hematologic:  [] Easy bruising  [] Easy bleeding   [] Hypercoagulable state   [] Anemic Gastrointestinal:  [] Diarrhea   [] Vomiting  [] Gastroesophageal  reflux/heartburn   [] Difficulty swallowing. Genitourinary:  [] Chronic kidney disease   [] Difficult urination  [] Frequent urination   [] Blood in urine Skin:  [] Rashes   [] Ulcers  Psychological:  [] History of anxiety   []  History of major depression.  Physical Examination  Vitals:   01/15/22 0529 01/15/22 0738 01/16/22 0100 01/16/22 0845  BP: 105/63 114/66 123/62 127/77  Pulse: 71 64 76   Resp: 17 17 17 20   Temp: 97.8 F (36.6 C) 98.5 F (36.9 C) 98.3 F (36.8 C) 98.3 F (36.8 C)  TempSrc:   Oral Oral  SpO2: 97% 100% 100% 92%  Weight:      Height:       Body mass index is 31.5 kg/m. Gen: WD/WN, NAD Head: [place]/AT, No temporalis wasting.  Ear/Nose/Throat: Hearing grossly intact, nares w/o erythema or drainage Eyes: PER, EOMI, sclera nonicteric.  Neck: Supple, no masses.  No bruit or JVD.  Pulmonary:  Good air movement, no audible wheezing, no use of accessory muscles.  Cardiac: RRR, normal S1, S2, no Murmurs. Vascular: Large ulcer right anterior shin just above the ankle which appears infected Vessel Right Left  Radial Palpable Palpable  PT Palpable Palpable  DP Not Palpable Not Palpable  Gastrointestinal: soft, non-distended. No guarding/no peritoneal signs.  Musculoskeletal: M/S 5/5 throughout.  No visible deformity.  Neurologic: CN 2-12 intact. Pain and light touch intact in extremities.  Symmetrical.  Speech is fluent. Motor exam as listed above. Psychiatric: Judgment intact, Mood & affect appropriate for pt's clinical situation. Dermatologic: No rashes or ulcers noted.  No changes consistent with cellulitis.   CBC Lab Results  Component Value Date   WBC 6.7 01/14/2022   HGB 14.4 01/14/2022   HCT 43.5 01/14/2022   MCV 86.0 01/14/2022   PLT 238 01/14/2022    BMET    Component Value Date/Time   NA 131 (L) 01/15/2022 0922   NA 136 08/25/2021 1358   K 4.7 01/15/2022 0922   CL 91 (L) 01/15/2022 0922   CO2 27 01/15/2022 0922   GLUCOSE 103 (H) 01/15/2022 0922    BUN 51 (H) 01/15/2022 0922   BUN 22 08/25/2021 1358   CREATININE 9.80 (H) 01/15/2022 0922   CALCIUM 8.0 (L) 01/15/2022 0922   GFRNONAA 6 (L) 01/15/2022 0922   Estimated Creatinine Clearance: 10.9 mL/min (A) (by C-G formula based on SCr of 9.8 mg/dL (H)).  COAG No results found for: "INR", "PROTIME"  Radiology US Venous Img Lower Unilateral Right  Result Date: 01/13/2022 CLINICAL DATA:  RIGHT lower extremity pain and swelling, RIGHT leg wound, question deep venous thrombosis EXAM: RIGHT LOWER EXTREMITY VENOUS DOPPLER ULTRASOUND TECHNIQUE: Gray-scale sonography with compression, as well as color and duplex ultrasound, were performed to evaluate the deep venous system(s) from  the level of the common femoral vein through the popliteal and proximal calf veins. COMPARISON:  None Available. FINDINGS: VENOUS Normal compressibility of the common femoral, superficial femoral, and popliteal veins, as well as the visualized calf veins. Visualized portions of profunda femoral vein and great saphenous vein unremarkable. No filling defects to suggest DVT on grayscale or color Doppler imaging. Doppler waveforms show normal direction of venous flow, normal respiratory plasticity and response to augmentation. Limited views of the contralateral common femoral vein are unremarkable. OTHER None. Limitations: none IMPRESSION: No evidence of deep venous thrombosis in the RIGHT lower extremity. Electronically Signed   By: Lavonia Dana M.D.   On: 01/13/2022 08:32   US ARTERIAL ABI (SCREENING LOWER EXTREMITY)  Result Date: 01/02/2022 CLINICAL DATA:  Limb ischemia, diabetic foot ulcer EXAM: NONINVASIVE PHYSIOLOGIC VASCULAR STUDY OF BILATERAL LOWER EXTREMITIES TECHNIQUE: Evaluation of both lower extremities were performed at rest, including calculation of ankle-brachial indices with single level Doppler, pressure and pulse volume recording. COMPARISON:  None Available. FINDINGS: Right ABI:  1.02 Left ABI:  0.93 Right Lower  Extremity:  Normal arterial waveforms at the ankle. Left Lower Extremity:  Normal arterial waveforms at the ankle. 1.0-1.4 Normal IMPRESSION: Normal resting ABIs Electronically Signed   By: Jerilynn Mages.  Shick M.D.   On: 01/02/2022 15:08   DG Tibia/Fibula Right  Result Date: 01/02/2022 CLINICAL DATA:  Nonhealing ulcer EXAM: RIGHT TIBIA AND FIBULA - 2 VIEW COMPARISON:  None Available. FINDINGS: No fracture or dislocation is seen. There are no focal lytic lesions. There is no effusion in the right knee. Fairly extensive arterial calcifications are seen in soft tissues. There is edema in subcutaneous plane. Plantar spur is seen in calcaneus. IMPRESSION: No fracture or dislocation is seen. There are no focal lytic lesions. Electronically Signed   By: Elmer Picker M.D.   On: 01/02/2022 13:00     Assessment/Plan Infected right lower extremity ulceration: Recommend  Patient should undergo debridement in the operating room.  Repeat wound cultures can be obtained at that time.  Also tissue specimen was sent for pathology.  Risks and benefits of been reviewed all questions been answered patient agrees to proceed.  Postdebridement either a VAC will be placed or compression wrap.  2.  Atrial fibrillation: Continue antiarrhythmia medications as already ordered, these medications have been reviewed and there are no changes at this time.  Continue anticoagulation patient did take 2.5 of Eliquis last night but has not taken any this morning.  This should be acceptable for debridement today.  We will proceed.  3.  Diabetes mellitus: Continue hypoglycemic medications as already ordered, these medications have been reviewed and there are no changes at this time.  Hgb A1C to be monitored as already arranged by primary service    4.  Hypertension: Continue antihypertensive medications as already ordered, these medications have been reviewed and there are no changes at this time.   Hortencia Pilar,  MD  01/16/2022 9:56 AM

## 2022-01-16 NOTE — Telephone Encounter (Signed)
Home Health Verbal Orders - Caller/Agency: [facility]  Callback Number: [phone]  Requesting OT/PT/Skilled Nursing/Social Work/Speech Therapy: PT  Frequency: 1w9

## 2022-01-16 NOTE — Op Note (Signed)
    OPERATIVE NOTE   PROCEDURE: Excisional debridement right shin wound. Application of a wound VAC  PRE-OPERATIVE DIAGNOSIS: Necrotic right shin wound  POST-OPERATIVE DIAGNOSIS: Same  SURGEON: Hortencia Pilar  ASSISTANT(S): Brien pace, NP  ANESTHESIA: MAC  ESTIMATED BLOOD LOSS: 10 cc  FINDING(S): Dry gangrenous tissue covering the wound no frank pus was encountered    SPECIMEN(S):  deep tissue for microbiology.  Also deep tissue sent for pathologic evaluation  INDICATIONS:   Adam Howell is a 61 y.o. male who presents with a necrotic wound of the right shin.  Despite antibiotic therapy and prior cultures he has been worsening and he has subsequently been readmitted after discharge.  He is undergoing excisional debridement for treatment of his wound which will also allow for deep wound culture and pathologic evaluation..  DESCRIPTION: After full informed written consent was obtained from the patient, the patient was brought back to the operating room and placed supine upon the operating table.  Prior to induction, the patient received IV antibiotics.   After obtaining adequate anesthesia, the patient was then prepped and draped in the standard fashion for a debridement of the right ankle/shin wound.  1% lidocaine with epinephrine is then infiltrated into the soft tissues along the edge of the ulcer as well as into the base of the ulcer.  Once lidocaine had been infiltrated circumferentially a 15 blade scalpel was used to excise the majority of the dried eschar and gangrenous tissue.  Next the Versajet on a setting of 5 was used to debride the next layer of devitalized tissue.  At this point in an area that still appeared to require further debridement I excised a portion of tissue with a clean 15 blade and passed it off for microbiology aerobic and anaerobic culture.  I then took a sample from the edge of the wound in several locations to to be evaluated for histology and  permanent section.  I returned to using the Versajet until the entire wound bed and skin edges demonstrated punctate bleeding and viable appearing tissue.  Hemostasis was then obtained with Bovie cautery.  The wound was then measured it is 10 cm in long axis by 9 cm in width for a total of 90 cm  A medium size VAC sponge was trimmed to the appropriate size and then a airtight VAC dressing was applied.  Excellent seal was noted and the VAC was set to 125 mmHg continuous.   The patient tolerated this procedure well.   COMPLICATIONS: None  CONDITION: [name]  Office: [phone]   01/16/2022, 11:41 AM

## 2022-01-16 NOTE — Progress Notes (Signed)
PROGRESS NOTE    Adam Howell   VQM:[id number] DOB: 1960-11-09  DOA: 01/13/2022 Date of Service: 01/16/22 PCP: Adam Lick, NP     Brief Narrative / Hospital Course:  Adam Howell is a 61 y.o. male with medical history significant for diabetes mellitus with complications of end-stage renal disease on hemodialysis (M/W/F), history of A-fib on chronic anticoagulation therapy, chronic pain syndrome, secondary hyperparathyroidism  He was recently admitted and discharged from the hospital on 01/07/22 for a nonhealing right lower extremity wound.  Wound culture from his right lower extremity yielded Proteus mirabilis and Klebsiella oxytoca and patient was discharged on Keflex. 10/31 He returns to the emergency room  for increasing pain, swelling and increased purulent drainage from his right leg ulcer since his hospitalization/discharge despite taking his antibiotics as prescribed. Admitted to the hospital for evaluation of worsening, nonhealing right lower extremity ulcer. Cultures repeated, started on rocephin and vancomycin  11/01-11/02, cultures pending/reincubated, remains on abx. MRSA screen neg. Consult surgery for possible debridement 11/03: Debridement RLE wound/ulcer in OR w/ Dr Delana Meyer. Initial cultures back - narrowed Abx to Unasyn     Consultants:  Nephrology  Procedures: 01/16/22: Debridement RLE wound/ulcer in OR w/ Dr Delana Meyer       ASSESSMENT & PLAN:   Principal Problem:   Ulcer of right lower extremity ([place]) Active Problems:   Type 2 diabetes mellitus with ESRD (end-stage renal disease) ([place])   Heart failure with reduced ejection fraction (HCC)   Atrial fibrillation (HCC)   Ulcer of right lower extremity (HCC) Cellulitis  Wound infection  Possible calciphylaxis  Patient has a full-thickness wound over the right anterior tibia with black eschar initially thought to be related to calciphylaxis. Wound appears to be infected and has increased  surrounding erythema with purulent drainage Place patient on IV Rocephin and IV vancomycin adjusted to renal function and based on last cultures --> adjusted to Unasyn today 01/16/22  Wound examined 01/15/22 - still foul smelling --> Consult surgery for possible debridement  Debridement RLE wound/ulcer in OR w/ Dr Delana Meyer 01/16/22 today   Type 2 diabetes mellitus with ESRD (end-stage renal disease) ([place]) Patient has type 2 diabetes mellitus with complications of end-stage renal disease Patient's  last hemoglobin A1c was 5.6 Maintain consistent carbohydrate diet We will check blood sugars AC meals with no coverage at this time to reduce risk for hypoglycemia Nephrology consulted Dialysis Monday/Wednesday/Friday  Heart failure with reduced ejection fraction ([place]) Stable and not acutely exacerbated Last known LVEF from 2021 was 35 to 40% Continue losartan, Bumex and carvedilol Continue renal replacement therapy to optimize volume status  Atrial fibrillation (HCC) Continue amiodarone and carvedilol for rate control Continue Eliquis     DVT prophylaxis: Eliquis Pertinent IV fluids/nutrition: none Central lines / invasive devices: none  Code Status: FULL CODE Family Communication: none at this time   Disposition: inpatient  TOC needs: none at this time, pending clinical improvement  Barriers to discharge / significant pending items: IV abx, anticipate will be in hospital another 1-2 nights pending clinical course             Subjective:  Patient resting comfortably, no concerns from RN, examined in dialysis suite, responsive but not conversational        Objective:  Vitals:   01/16/22 1420 01/16/22 1450 01/16/22 1520 01/16/22 1550  BP: 105/67 107/68 108/63 109/63  Pulse: 64 69 74 70  Resp: 11 13 13 12   Temp:      TempSrc:  SpO2: 98% 99% 99% 98%  Weight:      Height:        Intake/Output Summary (Last 24 hours) at 01/16/2022 1616 Last data filed at  01/16/2022 1153 Gross per 24 hour  Intake 1618.92 ml  Output 5 ml  Net 1613.92 ml   Filed Weights   01/12/22 2144 01/13/22 1842 01/14/22 1105  Weight: 117.9 kg 117.9 kg 114.3 kg    Examination:  Constitutional:  VS as above General Appearance: alert, well-developed, well-nourished, NAD Respiratory: Normal respiratory effort No rales Cardiovascular: S1/S2 normal No murmur No lower extremity edema Gastrointestinal: No tenderness Musculoskeletal/Skin:  No clubbing/cyanosis of digits Symmetrical movement in all extremities Wound vac in place over RLE wound  Neurological: No cranial nerve deficit on limited exam Alert Psychiatric: Normal judgment/insight Normal mood and affect        Scheduled Medications:   amiodarone  200 mg Oral BID   apixaban  2.5 mg Oral BID   bumetanide  1 mg Oral BID   carvedilol  3.125 mg Oral BID   Chlorhexidine Gluconate Cloth  6 each Topical Q0600   gabapentin  300 mg Oral BID   isosorbide mononitrate  30 mg Oral Daily   losartan  100 mg Oral Daily   NIFEdipine  30 mg Oral Daily   rosuvastatin  40 mg Oral Daily   sertraline  50 mg Oral Daily   sevelamer carbonate  1,600 mg Oral TID WC   silver sulfADIAZINE  1 Application Topical Daily    Continuous Infusions:  ampicillin-sulbactam (UNASYN) IV     anticoagulant sodium citrate      PRN Medications:  alteplase, anticoagulant sodium citrate, heparin, HYDROcodone-acetaminophen, HYDROmorphone (DILAUDID) injection, lidocaine (PF), lidocaine-prilocaine, ondansetron **OR** ondansetron (ZOFRAN) IV, pentafluoroprop-tetrafluoroeth, tiZANidine, traZODone  Antimicrobials:  Anti-infectives (From admission, onward)    Start     Dose/Rate Route Frequency Ordered Stop   01/16/22 1800  Ampicillin-Sulbactam (UNASYN) 3 g in sodium chloride 0.9 % 100 mL IVPB        3 g 200 mL/hr over 30 Minutes Intravenous Every 12 hours 01/16/22 1431     01/16/22 1030  ceFAZolin (ANCEF) 2-4 GM/100ML-% IVPB        Note to Pharmacy: Jordan Hawks H: cabinet override      01/16/22 1030 01/16/22 1051   01/16/22 0600  ceFAZolin (ANCEF) IVPB 2g/100 mL premix        2 g 200 mL/hr over 30 Minutes Intravenous On call to O.R. 01/16/22 0215 01/16/22 1105   01/14/22 1200  vancomycin (VANCOCIN) IVPB 1000 mg/200 mL premix  Status:  Discontinued        1,000 mg 200 mL/hr over 60 Minutes Intravenous Every M-W-F (Hemodialysis) 01/13/22 2011 01/16/22 1431   01/14/22 0600  cefTRIAXone (ROCEPHIN) 2 g in sodium chloride 0.9 % 100 mL IVPB  Status:  Discontinued        2 g 200 mL/hr over 30 Minutes Intravenous Every 24 hours 01/13/22 2002 01/16/22 1431   01/13/22 0700  vancomycin (VANCOREADY) IVPB 2000 mg/400 mL        2,000 mg 200 mL/hr over 120 Minutes Intravenous  Once 01/13/22 0651 01/13/22 1128   01/13/22 0645  cefTRIAXone (ROCEPHIN) 2 g in sodium chloride 0.9 % 100 mL IVPB        2 g 200 mL/hr over 30 Minutes Intravenous  Once 01/13/22 0630 01/13/22 8828       Data Reviewed: I have personally reviewed following labs and imaging studies  CBC:  Recent Labs  Lab 01/12/22 2148 01/14/22 0902  WBC 5.7 6.7  NEUTROABS 3.8 4.6  HGB 15.8 14.4  HCT 47.4 43.5  MCV 85.9 86.0  PLT 235 979   Basic Metabolic Panel: Recent Labs  Lab 01/12/22 2148 01/14/22 0902 01/15/22 0922  NA 134* 130*  130* 131*  K 5.1 5.1  5.0 4.7  CL 91* 88*  89* 91*  CO2 28 26  25 27   GLUCOSE 80 123*  119* 103*  BUN 46* 75*  74* 51*  CREATININE 10.50* 13.07*  12.96* 9.80*  CALCIUM 9.0 8.4*  8.4* 8.0*  PHOS  --  8.7* 6.4*   GFR: Estimated Creatinine Clearance: 10.9 mL/min (A) (by C-G formula based on SCr of 9.8 mg/dL (H)). Liver Function Tests: Recent Labs  Lab 01/12/22 2148 01/14/22 0902 01/15/22 0922  AST 20  --   --   ALT 14  --   --   ALKPHOS 72  --   --   BILITOT 0.9  --   --   PROT 8.9*  --   --   ALBUMIN 3.7 3.1* 2.8*   No results for input(s): "LIPASE", "AMYLASE" in the last 168 hours. No results  for input(s): "AMMONIA" in the last 168 hours. Coagulation Profile: No results for input(s): "INR", "PROTIME" in the last 168 hours. Cardiac Enzymes: No results for input(s): "CKTOTAL", "CKMB", "CKMBINDEX", "TROPONINI" in the last 168 hours. BNP (last 3 results) No results for input(s): "PROBNP" in the last 8760 hours. HbA1C: No results for input(s): "HGBA1C" in the last 72 hours. CBG: Recent Labs  Lab 01/14/22 2209 01/15/22 0742 01/15/22 1146 01/16/22 0836 01/16/22 1154  GLUCAP 121* 88 88 92 96   Lipid Profile: No results for input(s): "CHOL", "HDL", "LDLCALC", "TRIG", "CHOLHDL", "LDLDIRECT" in the last 72 hours. Thyroid Function Tests: No results for input(s): "TSH", "T4TOTAL", "FREET4", "T3FREE", "THYROIDAB" in the last 72 hours. Anemia Panel: No results for input(s): "VITAMINB12", "FOLATE", "FERRITIN", "TIBC", "IRON", "RETICCTPCT" in the last 72 hours. Urine analysis: No results found for: "COLORURINE", "APPEARANCEUR", "LABSPEC", "PHURINE", "GLUCOSEU", "HGBUR", "BILIRUBINUR", "KETONESUR", "PROTEINUR", "UROBILINOGEN", "NITRITE", "LEUKOCYTESUR" Sepsis Labs: @LABRCNTIP (procalcitonin:4,lacticidven:4)  Recent Results (from the past 240 hour(s))  Aerobic/Anaerobic Culture w Gram Stain (surgical/deep wound)     Status: None (Preliminary result)   Collection Time: 01/13/22  6:51 AM   Specimen: Abscess  Result Value Ref Range Status   Specimen Description   Final    ABSCESS Performed at [facility], [address], [place], [place] [address]    Special Requests   Final    NONE Performed at [facility], [address] [place], [place] [address]    Gram Stain   Final    NO WBC SEEN FEW GRAM NEGATIVE RODS FEW GRAM POSITIVE COCCI IN CHAINS RARE GRAM POSITIVE RODS    Culture   Final    MODERATE PROTEUS MIRABILIS MODERATE KLEBSIELLA OXYTOCA CULTURE REINCUBATED FOR BETTER GROWTH MODERATE BACTEROIDES THETAIOTAOMICRON BETA LACTAMASE POSITIVE Performed  at [facility], [place] [address], [place], [place] [address]    Report Status PENDING  Incomplete   Organism ID, Bacteria PROTEUS MIRABILIS  Final   Organism ID, Bacteria KLEBSIELLA OXYTOCA  Final      Susceptibility   Klebsiella oxytoca - MIC*    AMPICILLIN RESISTANT Resistant     CEFAZOLIN <=4 SENSITIVE Sensitive     CEFEPIME <=0.12 SENSITIVE Sensitive     CEFTAZIDIME <=1 SENSITIVE Sensitive     CEFTRIAXONE <=0.25 SENSITIVE Sensitive  CIPROFLOXACIN <=0.25 SENSITIVE Sensitive     GENTAMICIN <=1 SENSITIVE Sensitive     IMIPENEM <=0.25 SENSITIVE Sensitive     TRIMETH/SULFA <=20 SENSITIVE Sensitive     AMPICILLIN/SULBACTAM 4 SENSITIVE Sensitive     PIP/TAZO <=4 SENSITIVE Sensitive     * MODERATE KLEBSIELLA OXYTOCA   Proteus mirabilis - MIC*    AMPICILLIN <=2 SENSITIVE Sensitive     CEFAZOLIN 8 SENSITIVE Sensitive     CEFEPIME <=0.12 SENSITIVE Sensitive     CEFTAZIDIME <=1 SENSITIVE Sensitive     CEFTRIAXONE <=0.25 SENSITIVE Sensitive     CIPROFLOXACIN <=0.25 SENSITIVE Sensitive     GENTAMICIN <=1 SENSITIVE Sensitive     IMIPENEM 2 SENSITIVE Sensitive     TRIMETH/SULFA <=20 SENSITIVE Sensitive     AMPICILLIN/SULBACTAM <=2 SENSITIVE Sensitive     PIP/TAZO <=4 SENSITIVE Sensitive     * MODERATE PROTEUS MIRABILIS  MRSA Next Gen by PCR, Nasal     Status: None   Collection Time: 01/14/22  8:47 AM   Specimen: Nasal Mucosa; Nasal Swab  Result Value Ref Range Status   MRSA by PCR Next Gen NOT DETECTED NOT DETECTED Final    Comment: (NOTE) The GeneXpert MRSA Assay (FDA approved for NASAL specimens only), is one component of a comprehensive MRSA colonization surveillance program. It is not intended to diagnose MRSA infection nor to guide or monitor treatment for MRSA infections. Test performance is not FDA approved in patients less than 63 years old. Performed at [facility], [address] [place],  [address]          Radiology Studies: US Venous  Img Lower Unilateral Right  Result Date: 01/13/2022 CLINICAL DATA:  RIGHT lower extremity pain and swelling, RIGHT leg wound, question deep venous thrombosis EXAM: RIGHT LOWER EXTREMITY VENOUS DOPPLER ULTRASOUND TECHNIQUE: Gray-scale sonography with compression, as well as color and duplex ultrasound, were performed to evaluate the deep venous system(s) from the level of the common femoral vein through the popliteal and proximal calf veins. COMPARISON:  None Available. FINDINGS: VENOUS Normal compressibility of the common femoral, superficial femoral, and popliteal veins, as well as the visualized calf veins. Visualized portions of profunda femoral vein and great saphenous vein unremarkable. No filling defects to suggest DVT on grayscale or color Doppler imaging. Doppler waveforms show normal direction of venous flow, normal respiratory plasticity and response to augmentation. Limited views of the contralateral common femoral vein are unremarkable. OTHER None. Limitations: none IMPRESSION: No evidence of deep venous thrombosis in the RIGHT lower extremity. Electronically Signed   By: Lavonia Dana M.D.   On: 01/13/2022 08:32            LOS: 3 days        Emeterio Reeve, DO [facility] 01/16/2022, 4:16 PM   Staff may message me via secure chat in [place]  but this may not receive immediate response,  please page for urgent matters!  If 7PM-7AM, please contact night-coverage [email or web address]  Dictation software was used to generate the above note. Typos may occur and escape review, as with typed/written notes. Please contact Dr Sheppard Coil directly for clarity if needed.

## 2022-01-16 NOTE — Anesthesia Preprocedure Evaluation (Addendum)
Anesthesia Evaluation  Patient identified by MRN, date of birth, ID band Patient awake    Reviewed: Allergy & Precautions, NPO status , Patient's Chart, lab work & pertinent test results  History of Anesthesia Complications Negative for: history of anesthetic complications  Airway Mallampati: IV   Neck ROM: Full    Dental  (+) Missing   Pulmonary neg pulmonary ROS   Pulmonary exam normal breath sounds clear to auscultation       Cardiovascular hypertension, +CHF (EF 35-40%)  Normal cardiovascular exam+ dysrhythmias (a fib on Eliquis)  Rhythm:Regular Rate:Normal  ECG 04/13/21:  Atrial fibrillation Nonspecific intraventricular conduction delay Probable lateral infarct, age indeterminate   Neuro/Psych Chronic pain    GI/Hepatic negative GI ROS,,,  Endo/Other  diabetes, Type 2, Insulin Dependent  Obesity   Renal/GU ESRF and DialysisRenal disease (last HD 01/14/22)     Musculoskeletal   Abdominal   Peds  Hematology  (+) Blood dyscrasia (polycythemia)   Anesthesia Other Findings   Reproductive/Obstetrics                             Anesthesia Physical Anesthesia Plan  ASA: 3  Anesthesia Plan: General   Post-op Pain Management:    Induction: Intravenous  PONV Risk Score and Plan: 2 and Ondansetron, Dexamethasone and Treatment may vary due to age or medical condition  Airway Management Planned: LMA  Additional Equipment:   Intra-op Plan:   Post-operative Plan: Extubation in OR  Informed Consent: I have reviewed the patients History and Physical, chart, labs and discussed the procedure including the risks, benefits and alternatives for the proposed anesthesia with the patient or authorized representative who has indicated his/her understanding and acceptance.     Dental advisory given  Plan Discussed with: CRNA  Anesthesia Plan Comments: (Patient consented for risks of  anesthesia including but not limited to:  - adverse reactions to medications - damage to eyes, teeth, lips or other oral mucosa - nerve damage due to positioning  - sore throat or hoarseness - damage to heart, brain, nerves, lungs, other parts of body or loss of life  Informed patient about role of CRNA in peri- and intra-operative care.  Patient voiced understanding.)        Anesthesia Quick Evaluation

## 2022-01-16 NOTE — Progress Notes (Signed)
Pt 3.5 hour HD treatment complete w/ no complications. Pt alert, vss, report to primary RN. Start: 1420 End: 1801 159ml fluid removed 68.5L BVP Norco 10/325 given in HD 113.8kg post hd bed weight

## 2022-01-16 NOTE — Progress Notes (Signed)
Pt HD started at 1420. Pt arrived with 02 level of 85%. Placed on 2L . 98% currently. BFR decreased to 350 from 400 d/t repeated blood pump error alarms on machine. HD treatment currently going w/ no complications. Pt responds to voice, resting, vss, rn at bedside, access visible and safety maintained. Will continue to monitor.

## 2022-01-16 NOTE — Transfer of Care (Signed)
Immediate Anesthesia Transfer of Care Note  Patient: Adam Howell  Procedure(s) Performed: DEBRIDEMENT WOUND (Right)  Patient Location: PACU  Anesthesia Type:General  Level of Consciousness: awake, alert , and oriented  Airway & Oxygen Therapy: Patient Spontanous Breathing  Post-op Assessment: Report given to RN and Post -op Vital signs reviewed and stable  Post vital signs: Reviewed and stable  Last Vitals:  Vitals Value Taken Time  BP 96/56 01/16/22 1149  Temp 36.2 C 01/16/22 1149  Pulse 65 01/16/22 1152  Resp 22 01/16/22 1152  SpO2 97 % 01/16/22 1152  Vitals shown include unvalidated device data.  Last Pain:  Vitals:   01/16/22 1149  TempSrc:   PainSc: Asleep      Patients Stated Pain Goal: 0 (12/75/17 0017)  Complications: No notable events documented.

## 2022-01-17 DIAGNOSIS — E1122 Type 2 diabetes mellitus with diabetic chronic kidney disease: Secondary | ICD-10-CM | POA: Diagnosis not present

## 2022-01-17 DIAGNOSIS — I5020 Unspecified systolic (congestive) heart failure: Secondary | ICD-10-CM | POA: Diagnosis not present

## 2022-01-17 DIAGNOSIS — L97912 Non-pressure chronic ulcer of unspecified part of right lower leg with fat layer exposed: Secondary | ICD-10-CM | POA: Diagnosis not present

## 2022-01-17 DIAGNOSIS — I4811 Longstanding persistent atrial fibrillation: Secondary | ICD-10-CM | POA: Diagnosis not present

## 2022-01-17 LAB — GLUCOSE, CAPILLARY
Glucose-Capillary: 112 mg/dL — ABNORMAL HIGH (ref 70–99)
Glucose-Capillary: 75 mg/dL (ref 70–99)
Glucose-Capillary: 88 mg/dL (ref 70–99)
Glucose-Capillary: 88 mg/dL (ref 70–99)
Glucose-Capillary: 106 mg/dL — ABNORMAL HIGH (ref 70–99)

## 2022-01-17 LAB — BASIC METABOLIC PANEL
Anion gap: 9 (ref 5–15)
BUN: 41 mg/dL — ABNORMAL HIGH (ref 6–20)
CO2: 29 mmol/L (ref 22–32)
Calcium: 8.1 mg/dL — ABNORMAL LOW (ref 8.9–10.3)
Chloride: 94 mmol/L — ABNORMAL LOW (ref 98–111)
Creatinine, Ser: 9.1 mg/dL — ABNORMAL HIGH (ref 0.61–1.24)
GFR, Estimated: 6 mL/min — ABNORMAL LOW (ref >60)
Glucose, Bld: 107 mg/dL — ABNORMAL HIGH (ref 70–99)
Potassium: 5 mmol/L (ref 3.5–5.1)
Sodium: 132 mmol/L — ABNORMAL LOW (ref 135–145)

## 2022-01-17 LAB — CBC
HCT: 41.1 % (ref 39.0–52.0)
Hemoglobin: 13.2 g/dL (ref 13.0–17.0)
MCH: 28.1 pg (ref 26.0–34.0)
MCHC: 32.1 g/dL (ref 30.0–36.0)
MCV: 87.6 fL (ref 80.0–100.0)
Platelets: 210 10*3/uL (ref 150–400)
RBC: 4.69 MIL/uL (ref 4.22–5.81)
RDW: 15.2 % (ref 11.5–15.5)
WBC: 5.7 10*3/uL (ref 4.0–10.5)
nRBC: 0 % (ref 0.0–0.2)

## 2022-01-17 NOTE — Progress Notes (Signed)
BG 75. Hypoglycemia protocol followed. Recheck 88. Lunch is arriving.

## 2022-01-17 NOTE — Progress Notes (Signed)
PROGRESS NOTE    Adam Howell   POE:[id number] DOB: Nov 29, 1960  DOA: 01/13/2022 Date of Service: 01/17/22 PCP: Venita Lick, NP     Brief Narrative / Hospital Course:  Adam Howell is a 61 y.o. male with medical history significant for diabetes mellitus with complications of end-stage renal disease on hemodialysis (M/W/F), history of A-fib on chronic anticoagulation therapy, chronic pain syndrome, secondary hyperparathyroidism  He was recently admitted and discharged from the hospital on 01/07/22 for a nonhealing right lower extremity wound.  Wound culture from his right lower extremity yielded Proteus mirabilis and Klebsiella oxytoca and patient was discharged on Keflex. 10/31 He returns to the emergency room  for increasing pain, swelling and increased purulent drainage from his right leg ulcer since his hospitalization/discharge despite taking his antibiotics as prescribed. Admitted to the hospital for evaluation of worsening, nonhealing right lower extremity ulcer. Cultures repeated, started on rocephin and vancomycin  11/01-11/02, cultures pending/reincubated, remains on abx. MRSA screen neg. Consult surgery for possible debridement 11/03: Debridement RLE wound/ulcer in OR w/ Dr Delana Meyer. Initial cultures back - narrowed Abx to Unasyn   11/04: pend PT/OT eval (today is Saturday) expect will need HH/SNF    Consultants:  Nephrology  Procedures: 01/16/22: Debridement RLE wound/ulcer in OR w/ Dr Delana Meyer       ASSESSMENT & PLAN:   Principal Problem:   Ulcer of right lower extremity ([place]) Active Problems:   Type 2 diabetes mellitus with ESRD (end-stage renal disease) ([place])   Heart failure with reduced ejection fraction (HCC)   Atrial fibrillation (HCC)   Ulcer of right lower extremity ([place]) Cellulitis  Wound infection  Possible calciphylaxis  Patient has a full-thickness wound over the right anterior tibia with black eschar initially thought to be  related to calciphylaxis. Wound appears to be infected and has increased surrounding erythema with purulent drainage Place patient on IV Rocephin and IV vancomycin adjusted to renal function and based on last cultures --> adjusted to Unasyn 01/16/22  Wound examined 01/15/22 - still foul smelling --> Consult surgery for possible debridement  Debridement RLE wound/ulcer in OR w/ Dr Delana Meyer 01/16/22  Wound vac in place Pathology pending  Anticipate HH/SNF, PT/OT pending   Type 2 diabetes mellitus with ESRD (end-stage renal disease) ([place]) Patient has type 2 diabetes mellitus with complications of end-stage renal disease Patient's  last hemoglobin A1c was 5.6 Maintain consistent carbohydrate diet We will check blood sugars AC meals with no coverage at this time to reduce risk for hypoglycemia Nephrology consulted Dialysis Monday/Wednesday/Friday  Heart failure with reduced ejection fraction (HCC) Stable and not acutely exacerbated Last known LVEF from 2021 was 35 to 40% Continue losartan, Bumex and carvedilol Continue renal replacement therapy to optimize volume status  Atrial fibrillation (HCC) Continue amiodarone and carvedilol for rate control Continue Eliquis     DVT prophylaxis: Eliquis Pertinent IV fluids/nutrition: none Central lines / invasive devices: none. He has wound vac on RLE   Code Status: FULL CODE Family Communication: none at this time - pt declined call to anyone today    Disposition: inpatient  TOC needs: none at this time, pending PT/OT eval  Barriers to discharge / significant pending items: IV abx, PT/OT to eval s/p surgery and may need SNF/HH              Subjective:  Patient reports pain is controlled, no concerns. Pain in leg is much better than when he first came to the hospital.  Objective:  Vitals:   01/16/22 2202 01/16/22 2325 01/17/22 0757 01/17/22 0758  BP: (!) 120/56 111/64 93/60   Pulse: 78 73 76 73  Resp:  18 18    Temp:  98.2 F (36.8 C) 98.5 F (36.9 C)   TempSrc:      SpO2:  92% 92% 92%  Weight:      Height:        Intake/Output Summary (Last 24 hours) at 01/17/2022 1329 Last data filed at 01/17/2022 1029 Gross per 24 hour  Intake 240 ml  Output 1500 ml  Net -1260 ml   Filed Weights   01/13/22 1842 01/14/22 1105 01/16/22 1814  Weight: 117.9 kg 114.3 kg 113.8 kg    Examination:  Constitutional:  VS as above General Appearance: alert, well-developed, well-nourished, NAD Respiratory: Normal respiratory effort No rales Cardiovascular: S1/S2 normal No murmur No lower extremity edema Gastrointestinal: No tenderness Musculoskeletal/Skin:  No clubbing/cyanosis of digits Symmetrical movement in all extremities Wound vac in place over RLE wound  Neurological: No cranial nerve deficit on limited exam Alert Psychiatric: Normal judgment/insight Normal mood and affect        Scheduled Medications:   amiodarone  200 mg Oral BID   apixaban  2.5 mg Oral BID   bumetanide  1 mg Oral BID   carvedilol  3.125 mg Oral BID   gabapentin  300 mg Oral BID   insulin aspart  0-15 Units Subcutaneous TID WC   isosorbide mononitrate  30 mg Oral Daily   losartan  100 mg Oral Daily   NIFEdipine  30 mg Oral Daily   rosuvastatin  40 mg Oral Daily   sertraline  50 mg Oral Daily   sevelamer carbonate  1,600 mg Oral TID WC   silver sulfADIAZINE  1 Application Topical Daily    Continuous Infusions:  ampicillin-sulbactam (UNASYN) IV 3 g (01/17/22 0657)   anticoagulant sodium citrate      PRN Medications:  alteplase, anticoagulant sodium citrate, heparin, HYDROcodone-acetaminophen, lidocaine (PF), lidocaine-prilocaine, ondansetron **OR** ondansetron (ZOFRAN) IV, pentafluoroprop-tetrafluoroeth, tiZANidine, traZODone  Antimicrobials:  Anti-infectives (From admission, onward)    Start     Dose/Rate Route Frequency Ordered Stop   01/16/22 1800  Ampicillin-Sulbactam (UNASYN) 3 g in sodium  chloride 0.9 % 100 mL IVPB        3 g 200 mL/hr over 30 Minutes Intravenous Every 12 hours 01/16/22 1431     01/16/22 1030  ceFAZolin (ANCEF) 2-4 GM/100ML-% IVPB       Note to Pharmacy: Jordan Hawks H: cabinet override      01/16/22 1030 01/16/22 1051   01/16/22 0600  ceFAZolin (ANCEF) IVPB 2g/100 mL premix        2 g 200 mL/hr over 30 Minutes Intravenous On call to O.R. 01/16/22 0215 01/16/22 1105   01/14/22 1200  vancomycin (VANCOCIN) IVPB 1000 mg/200 mL premix  Status:  Discontinued        1,000 mg 200 mL/hr over 60 Minutes Intravenous Every M-W-F (Hemodialysis) 01/13/22 2011 01/16/22 1431   01/14/22 0600  cefTRIAXone (ROCEPHIN) 2 g in sodium chloride 0.9 % 100 mL IVPB  Status:  Discontinued        2 g 200 mL/hr over 30 Minutes Intravenous Every 24 hours 01/13/22 2002 01/16/22 1431   01/13/22 0700  vancomycin (VANCOREADY) IVPB 2000 mg/400 mL        2,000 mg 200 mL/hr over 120 Minutes Intravenous  Once 01/13/22 0651 01/13/22 1128   01/13/22 0645  cefTRIAXone (ROCEPHIN)  2 g in sodium chloride 0.9 % 100 mL IVPB        2 g 200 mL/hr over 30 Minutes Intravenous  Once 01/13/22 0630 01/13/22 8850       Data Reviewed: I have personally reviewed following labs and imaging studies  CBC: Recent Labs  Lab 01/12/22 2148 01/14/22 0902 01/17/22 0543  WBC 5.7 6.7 5.7  NEUTROABS 3.8 4.6  --   HGB 15.8 14.4 13.2  HCT 47.4 43.5 41.1  MCV 85.9 86.0 87.6  PLT 235 238 277   Basic Metabolic Panel: Recent Labs  Lab 01/12/22 2148 01/14/22 0902 01/15/22 0922 01/17/22 0543  NA 134* 130*  130* 131* 132*  K 5.1 5.1  5.0 4.7 5.0  CL 91* 88*  89* 91* 94*  CO2 28 26  25 27 29   GLUCOSE 80 123*  119* 103* 107*  BUN 46* 75*  74* 51* 41*  CREATININE 10.50* 13.07*  12.96* 9.80* 9.10*  CALCIUM 9.0 8.4*  8.4* 8.0* 8.1*  PHOS  --  8.7* 6.4*  --    GFR: Estimated Creatinine Clearance: 11.7 mL/min (A) (by C-G formula based on SCr of 9.1 mg/dL (H)). Liver Function Tests: Recent Labs   Lab 01/12/22 2148 01/14/22 0902 01/15/22 0922  AST 20  --   --   ALT 14  --   --   ALKPHOS 72  --   --   BILITOT 0.9  --   --   PROT 8.9*  --   --   ALBUMIN 3.7 3.1* 2.8*   No results for input(s): "LIPASE", "AMYLASE" in the last 168 hours. No results for input(s): "AMMONIA" in the last 168 hours. Coagulation Profile: No results for input(s): "INR", "PROTIME" in the last 168 hours. Cardiac Enzymes: No results for input(s): "CKTOTAL", "CKMB", "CKMBINDEX", "TROPONINI" in the last 168 hours. BNP (last 3 results) No results for input(s): "PROBNP" in the last 8760 hours. HbA1C: No results for input(s): "HGBA1C" in the last 72 hours. CBG: Recent Labs  Lab 01/16/22 1154 01/16/22 2124 01/17/22 0804 01/17/22 1145 01/17/22 1212  GLUCAP 96 129* 112* 75 88   Lipid Profile: No results for input(s): "CHOL", "HDL", "LDLCALC", "TRIG", "CHOLHDL", "LDLDIRECT" in the last 72 hours. Thyroid Function Tests: No results for input(s): "TSH", "T4TOTAL", "FREET4", "T3FREE", "THYROIDAB" in the last 72 hours. Anemia Panel: No results for input(s): "VITAMINB12", "FOLATE", "FERRITIN", "TIBC", "IRON", "RETICCTPCT" in the last 72 hours. Urine analysis: No results found for: "COLORURINE", "APPEARANCEUR", "LABSPEC", "PHURINE", "GLUCOSEU", "HGBUR", "BILIRUBINUR", "KETONESUR", "PROTEINUR", "UROBILINOGEN", "NITRITE", "LEUKOCYTESUR" Sepsis Labs: @LABRCNTIP (procalcitonin:4,lacticidven:4)  Recent Results (from the past 240 hour(s))  Aerobic/Anaerobic Culture w Gram Stain (surgical/deep wound)     Status: None (Preliminary result)   Collection Time: 01/13/22  6:51 AM   Specimen: Abscess  Result Value Ref Range Status   Specimen Description   Final    ABSCESS Performed at [facility], [address] [place], [place] [address]    Special Requests   Final    NONE Performed at [facility], [address] [place], [place] [address]    Gram Stain   Final    NO WBC SEEN FEW GRAM  NEGATIVE RODS FEW GRAM POSITIVE COCCI IN CHAINS RARE GRAM POSITIVE RODS    Culture   Final    MODERATE PROTEUS MIRABILIS MODERATE KLEBSIELLA OXYTOCA MODERATE ENTEROCOCCUS CASSELIFLAVUS MODERATE BACTEROIDES THETAIOTAOMICRON BETA LACTAMASE POSITIVE SUSCEPTIBILITIES TO FOLLOW Performed at [facility], [place] [address] [place], [place] [address]  Report Status PENDING  Incomplete   Organism ID, Bacteria PROTEUS MIRABILIS  Final   Organism ID, Bacteria KLEBSIELLA OXYTOCA  Final      Susceptibility   Klebsiella oxytoca - MIC*    AMPICILLIN RESISTANT Resistant     CEFAZOLIN <=4 SENSITIVE Sensitive     CEFEPIME <=0.12 SENSITIVE Sensitive     CEFTAZIDIME <=1 SENSITIVE Sensitive     CEFTRIAXONE <=0.25 SENSITIVE Sensitive     CIPROFLOXACIN <=0.25 SENSITIVE Sensitive     GENTAMICIN <=1 SENSITIVE Sensitive     IMIPENEM <=0.25 SENSITIVE Sensitive     TRIMETH/SULFA <=20 SENSITIVE Sensitive     AMPICILLIN/SULBACTAM 4 SENSITIVE Sensitive     PIP/TAZO <=4 SENSITIVE Sensitive     * MODERATE KLEBSIELLA OXYTOCA   Proteus mirabilis - MIC*    AMPICILLIN <=2 SENSITIVE Sensitive     CEFAZOLIN 8 SENSITIVE Sensitive     CEFEPIME <=0.12 SENSITIVE Sensitive     CEFTAZIDIME <=1 SENSITIVE Sensitive     CEFTRIAXONE <=0.25 SENSITIVE Sensitive     CIPROFLOXACIN <=0.25 SENSITIVE Sensitive     GENTAMICIN <=1 SENSITIVE Sensitive     IMIPENEM 2 SENSITIVE Sensitive     TRIMETH/SULFA <=20 SENSITIVE Sensitive     AMPICILLIN/SULBACTAM <=2 SENSITIVE Sensitive     PIP/TAZO <=4 SENSITIVE Sensitive     * MODERATE PROTEUS MIRABILIS  MRSA Next Gen by PCR, Nasal     Status: None   Collection Time: 01/14/22  8:47 AM   Specimen: Nasal Mucosa; Nasal Swab  Result Value Ref Range Status   MRSA by PCR Next Gen NOT DETECTED NOT DETECTED Final    Comment: (NOTE) The GeneXpert MRSA Assay (FDA approved for NASAL specimens only), is one component of a comprehensive MRSA colonization surveillance program. It is not  intended to diagnose MRSA infection nor to guide or monitor treatment for MRSA infections. Test performance is not FDA approved in patients less than 70 years old. Performed at [facility], [address] [place], [place] [address]   Aerobic/Anaerobic Culture w Gram Stain (surgical/deep wound)     Status: None (Preliminary result)   Collection Time: 01/16/22 11:12 AM   Specimen: Wound; Tissue  Result Value Ref Range Status   Specimen Description   Final    WOUND Performed at [facility], [address] [place], [place] [address]    Special Requests   Final    NONE Performed at [facility], [address] [place], [place] [address]    Gram Stain   Final    NO WBC SEEN FEW GRAM NEGATIVE RODS FEW GRAM POSITIVE COCCI IN CLUSTERS    Culture   Final    CULTURE REINCUBATED FOR BETTER GROWTH Performed at [facility], [place] [address], [place], [place] [address]    Report Status PENDING  Incomplete         Radiology Studies: US Venous Img Lower Unilateral Right  Result Date: 01/13/2022 CLINICAL DATA:  RIGHT lower extremity pain and swelling, RIGHT leg wound, question deep venous thrombosis EXAM: RIGHT LOWER EXTREMITY VENOUS DOPPLER ULTRASOUND TECHNIQUE: Gray-scale sonography with compression, as well as color and duplex ultrasound, were performed to evaluate the deep venous system(s) from the level of the common femoral vein through the popliteal and proximal calf veins. COMPARISON:  None Available. FINDINGS: VENOUS Normal compressibility of the common femoral, superficial femoral, and popliteal veins, as well as the visualized calf veins. Visualized portions of profunda femoral vein and great saphenous vein unremarkable. No  filling defects to suggest DVT on grayscale or color Doppler imaging. Doppler waveforms show normal direction of venous flow, normal respiratory plasticity and response to augmentation. Limited views of the contralateral  common femoral vein are unremarkable. OTHER None. Limitations: none IMPRESSION: No evidence of deep venous thrombosis in the RIGHT lower extremity. Electronically Signed   By: Lavonia Dana M.D.   On: 01/13/2022 08:32            LOS: 4 days        Emeterio Reeve, DO [facility] 01/17/2022, 1:29 PM   Staff may message me via secure chat in [place]  but this may not receive immediate response,  please page for urgent matters!  If 7PM-7AM, please contact night-coverage [email or web address]  Dictation software was used to generate the above note. Typos may occur and escape review, as with typed/written notes. Please contact Dr Sheppard Coil directly for clarity if needed.

## 2022-01-17 NOTE — Progress Notes (Signed)
[facility]  PROGRESS NOTE   Subjective:   Out of bed to chair feels much better.  Objective:  Vital signs: Blood pressure 93/60, pulse 73, temperature 98.5 F (36.9 C), resp. rate 18, height 6\' 3"  (1.905 m), weight 113.8 kg, SpO2 92 %.  Intake/Output Summary (Last 24 hours) at 01/17/2022 1333 Last data filed at 01/17/2022 1029 Gross per 24 hour  Intake 240 ml  Output 1500 ml  Net -1260 ml   Filed Weights   01/13/22 1842 01/14/22 1105 01/16/22 1814  Weight: 117.9 kg 114.3 kg 113.8 kg     Physical Exam: General:  No acute distress  Head:  Normocephalic, atraumatic. Moist oral mucosal membranes  Eyes:  Anicteric  Neck:  Supple  Lungs:   Clear to auscultation, normal effort  Heart:  S1S2 no rubs  Abdomen:   Soft, nontender, bowel sounds present  Extremities:  peripheral edema.  Neurologic:  Awake, alert, following commands  Skin:  No lesions  Access:     Basic Metabolic Panel: Recent Labs  Lab 01/12/22 2148 01/14/22 0902 01/15/22 0922 01/17/22 0543  NA 134* 130*  130* 131* 132*  K 5.1 5.1  5.0 4.7 5.0  CL 91* 88*  89* 91* 94*  CO2 28 26  25 27 29   GLUCOSE 80 123*  119* 103* 107*  BUN 46* 75*  74* 51* 41*  CREATININE 10.50* 13.07*  12.96* 9.80* 9.10*  CALCIUM 9.0 8.4*  8.4* 8.0* 8.1*  PHOS  --  8.7* 6.4*  --     CBC: Recent Labs  Lab 01/12/22 2148 01/14/22 0902 01/17/22 0543  WBC 5.7 6.7 5.7  NEUTROABS 3.8 4.6  --   HGB 15.8 14.4 13.2  HCT 47.4 43.5 41.1  MCV 85.9 86.0 87.6  PLT 235 238 210     Urinalysis: No results for input(s): "COLORURINE", "LABSPEC", "PHURINE", "GLUCOSEU", "HGBUR", "BILIRUBINUR", "KETONESUR", "PROTEINUR", "UROBILINOGEN", "NITRITE", "LEUKOCYTESUR" in the last 72 hours.  Invalid input(s): "APPERANCEUR"    Imaging: No results found.   Medications:    ampicillin-sulbactam (UNASYN) IV 3 g (01/17/22 0657)   anticoagulant sodium citrate      amiodarone  200 mg Oral BID   apixaban  2.5 mg Oral BID    bumetanide  1 mg Oral BID   carvedilol  3.125 mg Oral BID   gabapentin  300 mg Oral BID   insulin aspart  0-15 Units Subcutaneous TID WC   isosorbide mononitrate  30 mg Oral Daily   losartan  100 mg Oral Daily   NIFEdipine  30 mg Oral Daily   rosuvastatin  40 mg Oral Daily   sertraline  50 mg Oral Daily   sevelamer carbonate  1,600 mg Oral TID WC   silver sulfADIAZINE  1 Application Topical Daily    Assessment/ Plan:     Principal Problem:   Ulcer of right lower extremity (HCC) Active Problems:   Type 2 diabetes mellitus with ESRD (end-stage renal disease) ([place])   Heart failure with reduced ejection fraction (HCC)   Atrial fibrillation (HCC)   Diabetic ulcer of right lower leg ([place])  61 year old male with past medical conditions including diabetes, hyperlipidemia, hypertension, CHF, and end-stage renal disease on hemodialysis.  Patient presents to the emergency department with leg pain and will be admitted for Wound infection [T14.8XXA, L08.9] Diabetic ulcer of right lower leg ([place]) [B09.628, Z66.294]  #1: End-stage renal disease: Patient has been on a Monday Wednesday Friday schedule.  He had stable dialysis yesterday.  Next dialysis is scheduled for Monday.  #2: Second hyperparathyroidism: We will continue the sevelamer.  #3: Hypertension/congestive heart failure: We will continue the fluid tonight, carvedilol, losartan and nifedipine.  I advised him on importance of 2 g salt restricted diet with 1 L fluid restriction daily.  #4: Diabetes: Continue insulin as ordered.  #5: Nonhealing leg ulcers: S/p wound debridement.  On Unasyn at this time.  We will continue to monitor closely.   LOS: [place], MD [facility] 11/4/20231:33 PM

## 2022-01-17 NOTE — Plan of Care (Signed)

## 2022-01-18 DIAGNOSIS — I5020 Unspecified systolic (congestive) heart failure: Secondary | ICD-10-CM | POA: Diagnosis not present

## 2022-01-18 DIAGNOSIS — L97912 Non-pressure chronic ulcer of unspecified part of right lower leg with fat layer exposed: Secondary | ICD-10-CM | POA: Diagnosis not present

## 2022-01-18 DIAGNOSIS — E11622 Type 2 diabetes mellitus with other skin ulcer: Secondary | ICD-10-CM | POA: Diagnosis not present

## 2022-01-18 DIAGNOSIS — I4811 Longstanding persistent atrial fibrillation: Secondary | ICD-10-CM | POA: Diagnosis not present

## 2022-01-18 DIAGNOSIS — L97919 Non-pressure chronic ulcer of unspecified part of right lower leg with unspecified severity: Secondary | ICD-10-CM

## 2022-01-18 LAB — GLUCOSE, CAPILLARY
Glucose-Capillary: 90 mg/dL (ref 70–99)
Glucose-Capillary: 145 mg/dL — ABNORMAL HIGH (ref 70–99)
Glucose-Capillary: 110 mg/dL — ABNORMAL HIGH (ref 70–99)

## 2022-01-18 MED ORDER — AMOXICILLIN-POT CLAVULANATE 500-125 MG PO TABS
1.0000 | ORAL_TABLET | Freq: Every day | ORAL | Status: DC
  Administered 2022-01-18: 1 via ORAL
  Filled 2022-01-18 (×2): qty 1

## 2022-01-18 MED ORDER — LOSARTAN POTASSIUM 25 MG PO TABS: 25.0000 mg | ORAL_TABLET | Freq: Every day | ORAL | Status: DC

## 2022-01-18 MED ORDER — SODIUM CHLORIDE 0.9 % IV SOLN: INTRAVENOUS | Status: AC

## 2022-01-18 NOTE — Plan of Care (Signed)

## 2022-01-18 NOTE — Progress Notes (Signed)
PROGRESS NOTE    Adam Howell   MPN:[id number] DOB: [birth year]  DOA: 01/13/2022 Date of Service: 01/18/22 PCP: Venita Lick, NP     Brief Narrative / Hospital Course:  Adam Howell is a 61 y.o. male with medical history significant for diabetes mellitus with complications of end-stage renal disease on hemodialysis (M/W/F), history of A-fib on chronic anticoagulation therapy, chronic pain syndrome, secondary hyperparathyroidism  He was recently admitted and discharged from the hospital on 01/07/22 for a nonhealing right lower extremity wound.  Wound culture from his right lower extremity yielded Proteus mirabilis and Klebsiella oxytoca and patient was discharged on Keflex. 10/31 He returns to the emergency room  for increasing pain, swelling and increased purulent drainage from his right leg ulcer since his hospitalization/discharge despite taking his antibiotics as prescribed. Admitted to the hospital for evaluation of worsening, nonhealing right lower extremity ulcer. Cultures repeated, started on rocephin and vancomycin  11/01-11/02, cultures pending/reincubated, remains on abx. MRSA screen neg. Consult surgery for possible debridement 11/03: Debridement RLE wound/ulcer in OR w/ Dr Delana Meyer. Initial cultures back - narrowed Abx to Unasyn   11/04: pend PT/OT eval (today is Saturday) expect will need HH/SNF  11/05: transition to po abx, TOC to arrange [facility] and can d/c once this is confirmed --> wound vac will be delivered to pt's home tomorrow, should be ok to discharge after dialysis tm    Consultants:  Nephrology  Procedures: 01/16/22: Debridement RLE wound/ulcer in OR w/ Dr Delana Meyer       ASSESSMENT & PLAN:   Principal Problem:   Ulcer of right lower extremity ([place]) Active Problems:   Type 2 diabetes mellitus with ESRD (end-stage renal disease) ([place])   Heart failure with reduced ejection fraction (HCC)   Atrial fibrillation (HCC)   Ulcer of right lower  extremity ([place]) Cellulitis  Wound infection  Possible calciphylaxis  Patient has a full-thickness wound over the right anterior tibia with black eschar initially thought to be related to calciphylaxis. Wound appears to be infected and has increased surrounding erythema with purulent drainage Place patient on IV Rocephin and IV vancomycin adjusted to renal function and based on last cultures --> adjusted to Unasyn 01/16/22 --> cultures back --> augmentin po Wound examined 01/15/22 - still foul smelling --> Consult surgery for possible debridement  Debridement RLE wound/ulcer in OR w/ Dr Delana Meyer 01/16/22  Wound vac in place Pathology pending  Cultures (+) TOC to arrange HH/SNF   Type 2 diabetes mellitus with ESRD (end-stage renal disease) ([place]) Patient has type 2 diabetes mellitus with complications of end-stage renal disease Patient's  last hemoglobin A1c was 5.6 Maintain consistent carbohydrate diet We will check blood sugars AC meals with no coverage at this time to reduce risk for hypoglycemia Nephrology consulted Dialysis Monday/Wednesday/Friday  Heart failure with reduced ejection fraction (HCC) Stable and not acutely exacerbated Last known LVEF from 2021 was 35 to 40% Continue losartan, Bumex and carvedilol Continue renal replacement therapy to optimize volume status  Atrial fibrillation (HCC) Continue amiodarone and carvedilol for rate control Continue Eliquis     DVT prophylaxis: Eliquis Pertinent IV fluids/nutrition: none Central lines / invasive devices: none. He has wound vac on RLE   Code Status: FULL CODE Family Communication: none at this time - pt declined call to anyone today    Disposition: inpatient  TOC needs: HH Barriers to discharge / significant pending items: confirm HH in place --> can get wound vac tomorrow (which is Monday) anticipate d/c tomorrow  after dialysis here              Subjective:  Patient reports pain is controlled, no  concerns.       Objective:  Vitals:   01/17/22 0758 01/17/22 1658 01/17/22 2243 01/18/22 0739  BP:  103/60 132/74 124/74  Pulse: 73 70 75 67  Resp:  18  16  Temp:  98.9 F (37.2 C)  98.5 F (36.9 C)  TempSrc:      SpO2: 92% 94%  100%  Weight:      Height:        Intake/Output Summary (Last 24 hours) at 01/18/2022 1408 Last data filed at 01/18/2022 9735 Gross per 24 hour  Intake 300 ml  Output 50 ml  Net 250 ml    Filed Weights   01/13/22 1842 01/14/22 1105 01/16/22 1814  Weight: 117.9 kg 114.3 kg 113.8 kg    Examination:  Constitutional:  VS as above General Appearance: alert, well-developed, well-nourished, NAD Respiratory: Normal respiratory effort No rales Cardiovascular: S1/S2 normal No murmur No lower extremity edema Gastrointestinal: No tenderness Musculoskeletal/Skin:  No clubbing/cyanosis of digits Symmetrical movement in all extremities Wound vac in place over RLE wound  Neurological: No cranial nerve deficit on limited exam Alert Psychiatric: Normal judgment/insight Normal mood and affect        Scheduled Medications:   amiodarone  200 mg Oral BID   amoxicillin-clavulanate  1 tablet Oral q1800   apixaban  2.5 mg Oral BID   bumetanide  1 mg Oral BID   carvedilol  3.125 mg Oral BID   gabapentin  300 mg Oral BID   insulin aspart  0-15 Units Subcutaneous TID WC   isosorbide mononitrate  30 mg Oral Daily   losartan  100 mg Oral Daily   NIFEdipine  30 mg Oral Daily   rosuvastatin  40 mg Oral Daily   sertraline  50 mg Oral Daily   sevelamer carbonate  1,600 mg Oral TID WC   silver sulfADIAZINE  1 Application Topical Daily    Continuous Infusions:  anticoagulant sodium citrate      PRN Medications:  alteplase, anticoagulant sodium citrate, heparin, HYDROcodone-acetaminophen, lidocaine (PF), lidocaine-prilocaine, ondansetron **OR** ondansetron (ZOFRAN) IV, pentafluoroprop-tetrafluoroeth, tiZANidine, traZODone  Antimicrobials:   Anti-infectives (From admission, onward)    Start     Dose/Rate Route Frequency Ordered Stop   01/18/22 1800  amoxicillin-clavulanate (AUGMENTIN) 500-125 MG per tablet 1 tablet        1 tablet Oral Daily-1800 01/18/22 1316     01/16/22 1800  Ampicillin-Sulbactam (UNASYN) 3 g in sodium chloride 0.9 % 100 mL IVPB  Status:  Discontinued        3 g 200 mL/hr over 30 Minutes Intravenous Every 12 hours 01/16/22 1431 01/18/22 1316   01/16/22 1030  ceFAZolin (ANCEF) 2-4 GM/100ML-% IVPB       Note to Pharmacy: Jordan Hawks H: cabinet override      01/16/22 1030 01/16/22 1051   01/16/22 0600  ceFAZolin (ANCEF) IVPB 2g/100 mL premix        2 g 200 mL/hr over 30 Minutes Intravenous On call to O.R. 01/16/22 0215 01/16/22 1105   01/14/22 1200  vancomycin (VANCOCIN) IVPB 1000 mg/200 mL premix  Status:  Discontinued        1,000 mg 200 mL/hr over 60 Minutes Intravenous Every M-W-F (Hemodialysis) 01/13/22 2011 01/16/22 1431   01/14/22 0600  cefTRIAXone (ROCEPHIN) 2 g in sodium chloride 0.9 % 100 mL IVPB  Status:  Discontinued        2 g 200 mL/hr over 30 Minutes Intravenous Every 24 hours 01/13/22 2002 01/16/22 1431   01/13/22 0700  vancomycin (VANCOREADY) IVPB 2000 mg/400 mL        2,000 mg 200 mL/hr over 120 Minutes Intravenous  Once 01/13/22 0651 01/13/22 1128   01/13/22 0645  cefTRIAXone (ROCEPHIN) 2 g in sodium chloride 0.9 % 100 mL IVPB        2 g 200 mL/hr over 30 Minutes Intravenous  Once 01/13/22 0630 01/13/22 4627       Data Reviewed: I have personally reviewed following labs and imaging studies  CBC: Recent Labs  Lab 01/12/22 2148 01/14/22 0902 01/17/22 0543  WBC 5.7 6.7 5.7  NEUTROABS 3.8 4.6  --   HGB 15.8 14.4 13.2  HCT 47.4 43.5 41.1  MCV 85.9 86.0 87.6  PLT 235 238 035    Basic Metabolic Panel: Recent Labs  Lab 01/12/22 2148 01/14/22 0902 01/15/22 0922 01/17/22 0543  NA 134* 130*  130* 131* 132*  K 5.1 5.1  5.0 4.7 5.0  CL 91* 88*  89* 91* 94*  CO2 28  26  25 27 29   GLUCOSE 80 123*  119* 103* 107*  BUN 46* 75*  74* 51* 41*  CREATININE 10.50* 13.07*  12.96* 9.80* 9.10*  CALCIUM 9.0 8.4*  8.4* 8.0* 8.1*  PHOS  --  8.7* 6.4*  --     GFR: Estimated Creatinine Clearance: 11.7 mL/min (A) (by C-G formula based on SCr of 9.1 mg/dL (H)). Liver Function Tests: Recent Labs  Lab 01/12/22 2148 01/14/22 0902 01/15/22 0922  AST 20  --   --   ALT 14  --   --   ALKPHOS 72  --   --   BILITOT 0.9  --   --   PROT 8.9*  --   --   ALBUMIN 3.7 3.1* 2.8*    No results for input(s): "LIPASE", "AMYLASE" in the last 168 hours. No results for input(s): "AMMONIA" in the last 168 hours. Coagulation Profile: No results for input(s): "INR", "PROTIME" in the last 168 hours. Cardiac Enzymes: No results for input(s): "CKTOTAL", "CKMB", "CKMBINDEX", "TROPONINI" in the last 168 hours. BNP (last 3 results) No results for input(s): "PROBNP" in the last 8760 hours. HbA1C: No results for input(s): "HGBA1C" in the last 72 hours. CBG: Recent Labs  Lab 01/17/22 1212 01/17/22 1656 01/17/22 2121 01/18/22 0740 01/18/22 1119  GLUCAP 88 88 106* 90 145*    Lipid Profile: No results for input(s): "CHOL", "HDL", "LDLCALC", "TRIG", "CHOLHDL", "LDLDIRECT" in the last 72 hours. Thyroid Function Tests: No results for input(s): "TSH", "T4TOTAL", "FREET4", "T3FREE", "THYROIDAB" in the last 72 hours. Anemia Panel: No results for input(s): "VITAMINB12", "FOLATE", "FERRITIN", "TIBC", "IRON", "RETICCTPCT" in the last 72 hours. Urine analysis: No results found for: "COLORURINE", "APPEARANCEUR", "LABSPEC", "PHURINE", "GLUCOSEU", "HGBUR", "BILIRUBINUR", "KETONESUR", "PROTEINUR", "UROBILINOGEN", "NITRITE", "LEUKOCYTESUR" Sepsis Labs: @LABRCNTIP (procalcitonin:4,lacticidven:4)  Recent Results (from the past 240 hour(s))  Aerobic/Anaerobic Culture w Gram Stain (surgical/deep wound)     Status: None (Preliminary result)   Collection Time: 01/13/22  6:51 AM   Specimen:  Abscess  Result Value Ref Range Status   Specimen Description   Final    ABSCESS Performed at [facility], [address], [place], [place] [address]    Special Requests   Final    NONE Performed at [facility], [address] [place], [place] [address]    Gram Stain  Final    NO WBC SEEN FEW GRAM NEGATIVE RODS FEW GRAM POSITIVE COCCI IN CHAINS RARE GRAM POSITIVE RODS    Culture   Final    MODERATE PROTEUS MIRABILIS MODERATE KLEBSIELLA OXYTOCA MODERATE ENTEROCOCCUS CASSELIFLAVUS MODERATE BACTEROIDES THETAIOTAOMICRON BETA LACTAMASE POSITIVE SUSCEPTIBILITIES TO FOLLOW Performed at [facility], [place] [address], [place], [place] [address]    Report Status PENDING  Incomplete   Organism ID, Bacteria PROTEUS MIRABILIS  Final   Organism ID, Bacteria KLEBSIELLA OXYTOCA  Final      Susceptibility   Klebsiella oxytoca - MIC*    AMPICILLIN RESISTANT Resistant     CEFAZOLIN <=4 SENSITIVE Sensitive     CEFEPIME <=0.12 SENSITIVE Sensitive     CEFTAZIDIME <=1 SENSITIVE Sensitive     CEFTRIAXONE <=0.25 SENSITIVE Sensitive     CIPROFLOXACIN <=0.25 SENSITIVE Sensitive     GENTAMICIN <=1 SENSITIVE Sensitive     IMIPENEM <=0.25 SENSITIVE Sensitive     TRIMETH/SULFA <=20 SENSITIVE Sensitive     AMPICILLIN/SULBACTAM 4 SENSITIVE Sensitive     PIP/TAZO <=4 SENSITIVE Sensitive     * MODERATE KLEBSIELLA OXYTOCA   Proteus mirabilis - MIC*    AMPICILLIN <=2 SENSITIVE Sensitive     CEFAZOLIN 8 SENSITIVE Sensitive     CEFEPIME <=0.12 SENSITIVE Sensitive     CEFTAZIDIME <=1 SENSITIVE Sensitive     CEFTRIAXONE <=0.25 SENSITIVE Sensitive     CIPROFLOXACIN <=0.25 SENSITIVE Sensitive     GENTAMICIN <=1 SENSITIVE Sensitive     IMIPENEM 2 SENSITIVE Sensitive     TRIMETH/SULFA <=20 SENSITIVE Sensitive     AMPICILLIN/SULBACTAM <=2 SENSITIVE Sensitive     PIP/TAZO <=4 SENSITIVE Sensitive     * MODERATE PROTEUS MIRABILIS  MRSA Next Gen by PCR, Nasal     Status: None    Collection Time: 01/14/22  8:47 AM   Specimen: Nasal Mucosa; Nasal Swab  Result Value Ref Range Status   MRSA by PCR Next Gen NOT DETECTED NOT DETECTED Final    Comment: (NOTE) The GeneXpert MRSA Assay (FDA approved for NASAL specimens only), is one component of a comprehensive MRSA colonization surveillance program. It is not intended to diagnose MRSA infection nor to guide or monitor treatment for MRSA infections. Test performance is not FDA approved in patients less than 34 years old. Performed at [facility], [address] [place], [place] [address]   Aerobic/Anaerobic Culture w Gram Stain (surgical/deep wound)     Status: None (Preliminary result)   Collection Time: 01/16/22 11:12 AM   Specimen: Wound; Tissue  Result Value Ref Range Status   Specimen Description   Final    WOUND Performed at [facility], [address], [place], [place] [address]    Special Requests   Final    NONE Performed at [facility], [address] [place], [place] [address]    Gram Stain   Final    NO WBC SEEN FEW GRAM NEGATIVE RODS FEW GRAM POSITIVE COCCI IN CLUSTERS Performed at [facility], [place] [address], [place], [place] [address]    Culture   Final    MODERATE PROTEUS MIRABILIS FEW KLEBSIELLA OXYTOCA SUSCEPTIBILITIES PERFORMED ON PREVIOUS CULTURE WITHIN THE LAST 5 DAYS. NO ANAEROBES ISOLATED; CULTURE IN PROGRESS FOR 5 DAYS    Report Status PENDING  Incomplete         Radiology Studies: US Venous Img Lower Unilateral Right  Result Date: 01/13/2022 CLINICAL DATA:  RIGHT lower extremity pain and swelling,  RIGHT leg wound, question deep venous thrombosis EXAM: RIGHT LOWER EXTREMITY VENOUS DOPPLER ULTRASOUND TECHNIQUE: Gray-scale sonography with compression, as well as color and duplex ultrasound, were performed to evaluate the deep venous system(s) from the level of the common femoral vein through the popliteal and proximal calf veins.  COMPARISON:  None Available. FINDINGS: VENOUS Normal compressibility of the common femoral, superficial femoral, and popliteal veins, as well as the visualized calf veins. Visualized portions of profunda femoral vein and great saphenous vein unremarkable. No filling defects to suggest DVT on grayscale or color Doppler imaging. Doppler waveforms show normal direction of venous flow, normal respiratory plasticity and response to augmentation. Limited views of the contralateral common femoral vein are unremarkable. OTHER None. Limitations: none IMPRESSION: No evidence of deep venous thrombosis in the RIGHT lower extremity. Electronically Signed   By: Lavonia Dana M.D.   On: 01/13/2022 08:32            LOS: 5 days        Emeterio Reeve, DO [facility] 01/18/2022, 2:08 PM   Staff may message me via secure chat in [place]  but this may not receive immediate response,  please page for urgent matters!  If 7PM-7AM, please contact night-coverage [email or web address]  Dictation software was used to generate the above note. Typos may occur and escape review, as with typed/written notes. Please contact Dr Sheppard Coil directly for clarity if needed.

## 2022-01-18 NOTE — Progress Notes (Signed)
Patient is not able to walk the distance required to go the bathroom, or he/she is unable to safely negotiate stairs required to access the bathroom.  A 3in1 BSC will alleviate this problem  

## 2022-01-18 NOTE — Evaluation (Signed)
Occupational Therapy Evaluation Patient Details Name: Adam Howell MRN: [id number] DOB: 18-Jun-1960 Today's Date: 01/18/2022   History of Present Illness Pt is a 61 year old male s/p  Debridement RLE wound/ulcer 01/16/22, recently discharged 01/07/22 for a nonhealing right lower extremity wound, returned to ED on 10/31, admitted for further evaluation of wound. PMH significant for diabetes mellitus with complications of end-stage renal disease on hemodialysis (M/W/F), history of A-fib on chronic anticoagulation therapy, chronic pain syndrome, secondary hyperparathyroidism   Clinical Impression   Chart reviewed, pt greeted in chair agreeable to OT evaluation. Pt is alert and orineted x4, increased time required for processing, intermittent vcs for technique/safety throughout evaluation with good carry over. PTA pt has required increased assist for mobility recently due to RLE wound/pain however prior to that was indep ADL/IADL. Pt reports he lives with his adult daughter who can assist as needed. Pt presents with deficits in endurance, activity tolerance, balance all affecting safe and optimal ADL completion. Per chart review, pt has made progress in mobility/ADL efforts since previous admission however continues to perform below PLOF. Recommend discharge home with Dewy Rose. OT will continue to follow acutely.      Recommendations for follow up therapy are one component of a multi-disciplinary discharge planning process, led by the attending physician.  Recommendations may be updated based on patient status, additional functional criteria and insurance authorization.   Follow Up Recommendations  Home health OT    Assistance Recommended at Discharge Frequent or constant Supervision/Assistance  Patient can return home with the following A little help with walking and/or transfers;Assistance with cooking/housework;Assist for transportation;Help with stairs or ramp for entrance;A lot of help with  bathing/dressing/bathroom    Functional Status Assessment  Patient has had a recent decline in their functional status and demonstrates the ability to make significant improvements in function in a reasonable and predictable amount of time.  Equipment Recommendations  BSC/3in1    Recommendations for Other Services       Precautions / Restrictions Precautions Precautions: Fall Precaution Comments: wound vac R shin Restrictions Weight Bearing Restrictions: No      Mobility Bed Mobility               General bed mobility comments: NT in recliner pre/post session    Transfers Overall transfer level: Needs assistance Equipment used: Rolling walker (2 wheels) Transfers: Sit to/from Stand Sit to Stand: Min guard                  Balance Overall balance assessment: Needs assistance Sitting-balance support: Feet supported, No upper extremity supported Sitting balance-Leahy Scale: Good     Standing balance support: Bilateral upper extremity supported, During functional activity, Reliant on assistive device for balance Standing balance-Leahy Scale: Fair                             ADL either performed or assessed with clinical judgement   ADL Overall ADL's : Needs assistance/impaired     Grooming: Wash/dry hands;Standing;Supervision/safety               Lower Body Dressing: Maximal assistance   Toilet Transfer: Minimal assistance;Rolling walker (2 wheels);Regular Toilet   Toileting- Clothing Manipulation and Hygiene: Supervision/safety;Sitting/lateral lean       Functional mobility during ADLs: Min guard;Rolling walker (2 wheels) (household distances with intermittent vcs for technique/safety)       Vision Patient Visual Report: No change from baseline  Perception     Praxis      Pertinent Vitals/Pain Pain Assessment Pain Assessment: 0-10 Pain Score: 10-Worst pain ever Pain Location: RLE- especially with mobility Pain  Descriptors / Indicators: Aching, Grimacing, Sharp Pain Intervention(s): Limited activity within patient's tolerance, Monitored during session, Other (comment) (RN notified)     Hand Dominance Right   Extremity/Trunk Assessment Upper Extremity Assessment Upper Extremity Assessment: Overall WFL for tasks assessed   Lower Extremity Assessment Lower Extremity Assessment: Generalized weakness   Cervical / Trunk Assessment Cervical / Trunk Assessment: Normal   Communication Communication Communication: No difficulties   Cognition Arousal/Alertness: Awake/alert Behavior During Therapy: WFL for tasks assessed/performed Overall Cognitive Status: Within Functional Limits for tasks assessed Area of Impairment: Problem solving                             Problem Solving: Slow processing, Requires verbal cues, Requires tactile cues       General Comments  wound vac intact pre/post session    Exercises Other Exercises Other Exercises: edu re: role of OT, role of rehab, discharge recommendations, home safety, falls prevention, DME use for safe ADL completion   Shoulder Instructions      Home Living Family/patient expects to be discharged to:: Private residence Living Arrangements: Children (adult daughter) Available Help at Discharge: Family;Available 24 hours/day Type of Home: Apartment Home Access: Level entry     Home Layout: One level     Bathroom Shower/Tub: Teacher, early years/pre: Handicapped height     Home Equipment: None   Additional Comments: Pt reports he lives with daughter who does not work      Prior Functioning/Environment Prior Level of Function : Independent/Modified Independent;Driving               ADLs Comments: Pt endorses being independent for ADLs/IADLs. Shared responsibility for household management tasks with daughter. Not working.        OT Problem List: Decreased strength;Decreased knowledge of use of DME or  AE;Decreased activity tolerance;Impaired balance (sitting and/or standing);Pain;Decreased range of motion      OT Treatment/Interventions: Self-care/ADL training;Patient/family education;Therapeutic exercise;Balance training;Energy conservation;Therapeutic activities;DME and/or AE instruction    OT Goals(Current goals can be found in the care plan section) Acute Rehab OT Goals Patient Stated Goal: improve functional status OT Goal Formulation: With patient Time For Goal Achievement: 02/01/22 Potential to Achieve Goals: Good ADL Goals Pt Will Perform Grooming: with modified independence Pt Will Transfer to Toilet: with modified independence;ambulating Pt Will Perform Toileting - Clothing Manipulation and hygiene: with modified independence;sit to/from stand Pt Will Perform Tub/Shower Transfer: with supervision  OT Frequency: Min 2X/week    Co-evaluation              AM-PAC OT "6 Clicks" Daily Activity     Outcome Measure Help from another person eating meals?: None Help from another person taking care of personal grooming?: None Help from another person toileting, which includes using toliet, bedpan, or urinal?: None Help from another person bathing (including washing, rinsing, drying)?: A Little Help from another person to put on and taking off regular upper body clothing?: A Little Help from another person to put on and taking off regular lower body clothing?: A Lot 6 Click Score: 20   End of Session Equipment Utilized During Treatment: Rolling walker (2 wheels) Nurse Communication: Mobility status;Patient requests pain meds  Activity Tolerance: Patient tolerated treatment well Patient left: in chair;with  call bell/phone within reach;with chair alarm set  OT Visit Diagnosis: Unsteadiness on feet (R26.81);Muscle weakness (generalized) (M62.81);Pain                Time: 1740-9927 OT Time Calculation (min): 16 min Charges:  OT General Charges $OT Visit: 1 Visit OT  Evaluation $OT Eval Low Complexity: 1 Low  Shanon Payor, OTD OTR/L  01/18/22, 11:07 AM

## 2022-01-18 NOTE — Evaluation (Signed)
Physical Therapy Evaluation Patient Details Name: Heidi Lemay MRN: [id number] DOB: 08-19-1960 Today's Date: 01/18/2022  History of Present Illness  Deshaun Weisinger is a 61 y.o. male with medical history significant for diabetes mellitus with complications of end-stage renal disease on hemodialysis (M/W/F), history of A-fib on chronic anticoagulation therapy, chronic pain syndrome, secondary hyperparathyroidism. He was recently admitted and discharged from the hospital on 01/07/22 for a nonhealing right lower extremity wound.  Wound culture from his right lower extremity yielded Proteus mirabilis and Klebsiella oxytoca and patient was discharged on Keflex. 10/31 He returns to the emergency room  for increasing pain, swelling and increased purulent drainage from his right leg ulcer since his hospitalization/discharge despite taking his antibiotics as prescribed. Admitted to the hospital for evaluation of worsening, nonhealing right lower extremity ulcer. Cultures repeated, started on rocephin and vancomycin. 11/01-11/02, cultures pending/reincubated, remains on abx. MRSA screen neg. Consult surgery for possible debridement. 11/03: Debridement RLE wound/ulcer in OR w/ Dr Delana Meyer. Initial cultures back - narrowed Abx to Unasyn. 11/04: pend PT/OT eval (today is Saturday) expect will need HH/SNF.   Clinical Impression  Patient received in semi-fowler position in bed upon arrival. He was alert and oriented and able to provide a detailed history. He lives alone in a level entry one story apartment with his daughter who does not work and can be Engineer, mining. Prior to hospitalization he was driving and was I with all aspects of care and mobility and denies falls in the last 6 months. He has no AD. Upon PT evaluation, patient was mod I for bed mobility, needed CGA for sit <> stand from low bed to armed chair where he needed a lot of extra time and effort to go sit to stand, likely due to pain and weakness  in B LE. He ambulated over 300 feet with CGA-SBA with RW with complaint of increased pain at R lower leg. Patient required cuing for safe hand/AD placement during mobility. He would benefit from HHPT and a RW to improve safety and functional mobility at home. He would benefit from a 3in1 BSC to use as a shower seat to improve safety while bathing. Patient would benefit from skilled physical therapy to address impairments and functional limitations (see PT Problem List below) to work towards stated goals and return to PLOF or maximal functional independence.     Recommendations for follow up therapy are one component of a multi-disciplinary discharge planning process, led by the attending physician.  Recommendations may be updated based on patient status, additional functional criteria and insurance authorization.  Follow Up Recommendations Home health PT      Assistance Recommended at Discharge Intermittent Supervision/Assistance  Patient can return home with the following  A little help with walking and/or transfers;Assistance with cooking/housework;Assist for transportation;Help with stairs or ramp for entrance;A little help with bathing/dressing/bathroom    Equipment Recommendations Rolling walker (2 wheels);BSC/3in1  Recommendations for Other Services       Functional Status Assessment Patient has had a recent decline in their functional status and demonstrates the ability to make significant improvements in function in a reasonable and predictable amount of time.     Precautions / Restrictions Precautions Precautions: Fall Restrictions Weight Bearing Restrictions: No      Mobility  Bed Mobility Overal bed mobility: Modified Independent             General bed mobility comments: supine to sit with inceased time/effort holding onto rail, head of bed slightly elevated.  Transfers Overall transfer level: Needs assistance Equipment used: Rolling walker (2  wheels) Transfers: Sit to/from Stand Sit to Stand: Min guard           General transfer comment: VC for hand placement, required increased time and multiple attempts. Groaning when using L UE on knee to go from sit to stand at normal height bed.    Ambulation/Gait Ambulation/Gait assistance: Min guard, Supervision Gait Distance (Feet): 380 Feet Assistive device: Rolling walker (2 wheels) Gait Pattern/deviations: Step-through pattern, Decreased step length - left, Decreased step length - right Gait velocity: slow     General Gait Details: Patient ambulated with RW and CGA-SBA with slow gait with bilateral heel strike.  Stairs            Wheelchair Mobility    Modified Rankin (Stroke Patients Only)       Balance Overall balance assessment: Needs assistance Sitting-balance support: Feet supported, No upper extremity supported Sitting balance-Leahy Scale: Good Sitting balance - Comments: steady sitting edge of bed   Standing balance support: Bilateral upper extremity supported, During functional activity, Reliant on assistive device for balance Standing balance-Leahy Scale: Fair Standing balance comment: patient dependent on RW for ambulation but can take hands off without LOB when standing still                             Pertinent Vitals/Pain Pain Assessment Pain Assessment: 0-10 Pain Score: 8  Pain Location: R anterior shin at wound. Reports intermittant shooting pain 10+/10 at R knee that has been waking him up at night. Pain Descriptors / Indicators: Grimacing, Discomfort Pain Intervention(s): Limited activity within patient's tolerance, Monitored during session, Repositioned, RN gave pain meds during session    [place] expects to be discharged to:: Private residence Living Arrangements: Children Available Help at Discharge: Family;Available 24 hours/day Type of Home: Apartment Home Access: Level entry       Home Layout:  One level Home Equipment: None Additional Comments: Pt reports he lives with daughter who does not work    Prior Function Prior Level of Function : Independent/Modified Independent;Driving               ADLs Comments: Pt endorses being independent for ADLs/IADLs. Shared responsibility for household management tasks with daughter. Not working.     Hand Dominance   Dominant Hand: Right    Extremity/Trunk Assessment   Upper Extremity Assessment Upper Extremity Assessment: Overall WFL for tasks assessed    Lower Extremity Assessment Lower Extremity Assessment: Generalized weakness    Cervical / Trunk Assessment Cervical / Trunk Assessment: Normal  Communication   Communication: No difficulties  Cognition Arousal/Alertness: Awake/alert Behavior During Therapy: WFL for tasks assessed/performed Overall Cognitive Status: Within Functional Limits for tasks assessed                                          General Comments General comments (skin integrity, edema, etc.): wound vac on R anterior lower leg wound    Exercises Other Exercises Other Exercises: educated on role of PT in acute care setting, discharge reccomendations, importance of mobility.   Assessment/Plan    PT Assessment Patient needs continued PT services  PT Problem List Decreased strength;Decreased mobility;Decreased activity tolerance;Pain;Decreased skin integrity;Decreased balance       PT Treatment Interventions DME instruction;Therapeutic exercise;Gait training;Balance training;Neuromuscular re-education;Therapeutic  activities;Functional mobility training;Patient/family education    PT Goals (Current goals can be found in the Care Plan section)  Acute Rehab PT Goals Patient Stated Goal: return home, get right lower leg to heal PT Goal Formulation: With patient Time For Goal Achievement: 02/01/22 Potential to Achieve Goals: Good    Frequency Min 2X/week     Co-evaluation                AM-PAC PT "6 Clicks" Mobility  Outcome Measure Help needed turning from your back to your side while in a flat bed without using bedrails?: A Little Help needed moving from lying on your back to sitting on the side of a flat bed without using bedrails?: A Little Help needed moving to and from a bed to a chair (including a wheelchair)?: A Little Help needed standing up from a chair using your arms (e.g., wheelchair or bedside chair)?: A Little Help needed to walk in hospital room?: A Little Help needed climbing 3-5 steps with a railing? : A Little 6 Click Score: 18    End of Session Equipment Utilized During Treatment: Gait belt Activity Tolerance: Patient tolerated treatment well Patient left: in chair;with call bell/phone within reach;with chair alarm set Nurse Communication: Mobility status PT Visit Diagnosis: Other abnormalities of gait and mobility (R26.89);Muscle weakness (generalized) (M62.81)    Time: 7616-0737 PT Time Calculation (min) (ACUTE ONLY): 35 min   Charges:   PT Evaluation $PT Eval Low Complexity: 1 Low PT Treatments $Therapeutic Activity: 8-22 mins        Everlean Alstrom. Graylon Good, PT, DPT 01/18/22, 9:31 AM  [facility] [address] [place], [place] [address] P: [phone] I F: [phone]

## 2022-01-18 NOTE — TOC Initial Note (Addendum)
Transition of Care [facility]) - Initial/Assessment Note    Patient Details  Name: Adam Howell MRN: [id number] Date of Birth: 1960/11/18  Transition of Care [facility]) CM/SW Contact:    Magnus Ivan, LCSW Phone Number: 01/18/2022, 10:54 AM  Clinical Narrative:                 Spoke with patient regarding DC planning. Patient was recently DC and set up with [facility] and a RW.  Patient confirmed he is active with [facility], notified Corene Cornea of admission.  Patient states he never got the RW. PT is now also recommending a 3in1 which patient is agreeable to being ordered. CSW called Jasmine with [employer] who stated an account was not started for patient and she will get the RW and 3in1 ordered and delivered to bedside.  Patient states he will return home with his daughter when DC and his daughter will provide transportation.  PCP is [employer] and Pharmacy is [facility].   11:05- Jasmine with [employer] called and stated she did find patient's account and he did get a RW on 10/25 which was delivered to bedside and signed for by patient.  Jasmine stated patient has the option to private pay for a RW which is about $45.  Spoke to patient again who states he signed for the RW but did not take it home. CSW notified Jasmine with [employer]. Asked RN if they know about RW being left on the floor.   11:30- RW located on 2C by [facility]. Retrieved from [facility] and delivered to patient's bedside by this CSW. Notified [employer], [employer] will still deliver 3in1 to room.    1:38- Notified by MD patient will need a wound vac for home and HHRN. Called and notified Corene Cornea with [facility] who stated they will need patient to DC home tomorrow for [facility] scheduling. Notified MD.  Arman Filter with [employer] and placed order for wound vac, informed her of plan for DC tomorrow morning.   Expected Discharge Plan: [place] Barriers to Discharge: Continued Medical Work up   Patient Goals and CMS  Choice Patient states their goals for this hospitalization and ongoing recovery are:: home with home health CMS Medicare.gov Compare Post Acute Care list provided to:: Patient Choice offered to / list presented to : Patient  Expected Discharge Plan and Services Expected Discharge Plan: [place]       Living arrangements for the past 2 months: Single Family Home                 DME Arranged: 3-N-1, Walker rolling DME Agency: [employer] Date DME Agency Contacted: 01/18/22   Representative spoke with at DME Agency: [place]: PT [place]: [place] ([place]) Date [place]: 01/18/22   Representative spoke with at [place]: Corene Cornea  Prior Living Arrangements/Services Living arrangements for the past 2 months: [place] Lives with:: Adult Children Patient language and need for interpreter reviewed:: Yes Do you feel safe going back to the place where you live?: Yes      Need for Family Participation in Patient Care: Yes (Comment) Care giver support system in place?: Yes (comment)   Criminal Activity/Legal Involvement Pertinent to Current Situation/Hospitalization: No - Comment as needed  Activities of Daily Living Home Assistive Devices/Equipment: [name] (specify type) ADL Screening (condition at time of admission) Patient's cognitive ability adequate to safely complete daily activities?: Yes Is the patient deaf  or have difficulty hearing?: No Does the patient have difficulty seeing, even when wearing glasses/contacts?: No Does the patient have difficulty concentrating, remembering, or making decisions?: No Patient able to express need for assistance with ADLs?: Yes Does the patient have difficulty dressing or bathing?: No Independently performs ADLs?: Yes (appropriate for developmental age) Does the patient have difficulty walking or climbing stairs?: No Weakness of Legs: None Weakness of Arms/Hands:  None  Permission Sought/Granted Permission sought to share information with : Facility Sport and exercise psychologist, Family Supports Permission granted to share information with : Yes, Verbal Permission Granted     Permission granted to share info w AGENCY: [place], DME agencies        Emotional Assessment       Orientation: : Oriented to Self, Oriented to Place, Oriented to  Time, Oriented to Situation Alcohol / Substance Use: Not Applicable Psych Involvement: No (comment)  Admission diagnosis:  Wound infection [T14.8XXA, L08.9] Diabetic ulcer of right lower leg ([place]) [U31.497, L97.919] Patient Active Problem List   Diagnosis Date Noted   Diabetic ulcer of right lower leg ([place]) 01/15/2022   Wound infection 01/13/2022   Controlled substance agreement signed 04/10/2021   Ulcer of right lower extremity ([place]) 02/02/2020   Other thrombophilia ([place]) 01/19/2020   Atrial fibrillation ([place]) 11/11/2019   Hyperlipidemia associated with type 2 diabetes mellitus ([place]) 11/11/2019   Erectile dysfunction 11/11/2019   Heart failure with reduced ejection fraction ([place]) 10/26/2019   Type 2 diabetes mellitus with ESRD (end-stage renal disease) ([place]) 10/04/2019   Chronic pain syndrome 10/04/2019   Insomnia 10/04/2019   Atherosclerotic heart disease of native coronary artery without angina pectoris 09/01/2019   Hypertensive heart and kidney disease with heart failure and chronic kidney disease stage V ([place]) 09/01/2019   Obesity 09/01/2019   Anemia in chronic kidney disease 07/25/2019   End stage renal disease ([place]) 07/25/2019   Secondary hyperparathyroidism of renal origin ([place]) 07/25/2019   PCP:  Venita Lick, NP Pharmacy:   [facility] [address]), [place] [address] [place]) [place] [address] Phone: [phone] Fax: [phone]     Social Determinants of Health (SDOH) Interventions    Readmission Risk Interventions     No data  to display

## 2022-01-18 NOTE — Progress Notes (Signed)
[facility]  PROGRESS NOTE   Subjective:   Patient is awake and alert.  Objective:  Vital signs: Blood pressure 124/74, pulse 67, temperature 98.5 F (36.9 C), resp. rate 16, height 6\' 3"  (1.905 m), weight 113.8 kg, SpO2 100 %.  Intake/Output Summary (Last 24 hours) at 01/18/2022 1326 Last data filed at 01/18/2022 0509 Gross per 24 hour  Intake 300 ml  Output 50 ml  Net 250 ml   Filed Weights   01/13/22 1842 01/14/22 1105 01/16/22 1814  Weight: 117.9 kg 114.3 kg 113.8 kg     Physical Exam: General:  No acute distress  Head:  Normocephalic, atraumatic. Moist oral mucosal membranes  Eyes:  Anicteric  Neck:  Supple  Lungs:   Clear to auscultation, normal effort  Heart:  S1S2 no rubs  Abdomen:   Soft, nontender, bowel sounds present  Extremities:  peripheral edema.  Neurologic:  Awake, alert, following commands  Skin:  No lesions  Access:     Basic Metabolic Panel: Recent Labs  Lab 01/12/22 2148 01/14/22 0902 01/15/22 0922 01/17/22 0543  NA 134* 130*  130* 131* 132*  K 5.1 5.1  5.0 4.7 5.0  CL 91* 88*  89* 91* 94*  CO2 28 26  25 27 29   GLUCOSE 80 123*  119* 103* 107*  BUN 46* 75*  74* 51* 41*  CREATININE 10.50* 13.07*  12.96* 9.80* 9.10*  CALCIUM 9.0 8.4*  8.4* 8.0* 8.1*  PHOS  --  8.7* 6.4*  --     CBC: Recent Labs  Lab 01/12/22 2148 01/14/22 0902 01/17/22 0543  WBC 5.7 6.7 5.7  NEUTROABS 3.8 4.6  --   HGB 15.8 14.4 13.2  HCT 47.4 43.5 41.1  MCV 85.9 86.0 87.6  PLT 235 238 210     Urinalysis: No results for input(s): "COLORURINE", "LABSPEC", "PHURINE", "GLUCOSEU", "HGBUR", "BILIRUBINUR", "KETONESUR", "PROTEINUR", "UROBILINOGEN", "NITRITE", "LEUKOCYTESUR" in the last 72 hours.  Invalid input(s): "APPERANCEUR"    Imaging: No results found.   Medications:    anticoagulant sodium citrate      amiodarone  200 mg Oral BID   amoxicillin-clavulanate  1 tablet Oral q1800   apixaban  2.5 mg Oral BID   bumetanide  1 mg  Oral BID   carvedilol  3.125 mg Oral BID   gabapentin  300 mg Oral BID   insulin aspart  0-15 Units Subcutaneous TID WC   isosorbide mononitrate  30 mg Oral Daily   losartan  100 mg Oral Daily   NIFEdipine  30 mg Oral Daily   rosuvastatin  40 mg Oral Daily   sertraline  50 mg Oral Daily   sevelamer carbonate  1,600 mg Oral TID WC   silver sulfADIAZINE  1 Application Topical Daily    Assessment/ Plan:     Principal Problem:   Ulcer of right lower extremity (HCC) Active Problems:   Type 2 diabetes mellitus with ESRD (end-stage renal disease) (HCC)   Heart failure with reduced ejection fraction (HCC)   Atrial fibrillation (HCC)   Diabetic ulcer of right lower leg ([place])  61 year old male with past medical conditions including diabetes, hyperlipidemia, hypertension, CHF, and end-stage renal disease on hemodialysis.  Patient presents to the emergency department with leg pain and will be admitted for Wound infection [T14.8XXA, L08.9] Diabetic ulcer of right lower leg ([place]) [D66.440, H47.425]   #1: End-stage renal disease: Patient has been on a Monday Wednesday Friday schedule.  He had stable dialysis yesterday.  Next dialysis is  scheduled for Monday.   #2: Second hyperparathyroidism: We will continue the sevelamer.   #3: Hypertension/congestive heart failure: We will continue the fluid tonight, carvedilol, losartan and nifedipine.  I advised him on importance of 2 g salt restricted diet with 1 L fluid restriction daily.   #4: Diabetes: Continue insulin as ordered.   #5: Nonhealing leg ulcers: S/p wound debridement.  On Unasyn at this time.   We will continue to monitor closely.   LOS: [place] 11/5/20231:26 PM

## 2022-01-19 ENCOUNTER — Encounter: Payer: Self-pay | Admitting: Vascular Surgery

## 2022-01-19 LAB — RENAL FUNCTION PANEL
Albumin: 2.4 g/dL — ABNORMAL LOW (ref 3.5–5.0)
Anion gap: 11 (ref 5–15)
BUN: 61 mg/dL — ABNORMAL HIGH (ref 6–20)
CO2: 26 mmol/L (ref 22–32)
Calcium: 7.5 mg/dL — ABNORMAL LOW (ref 8.9–10.3)
Chloride: 95 mmol/L — ABNORMAL LOW (ref 98–111)
Creatinine, Ser: 12.05 mg/dL — ABNORMAL HIGH (ref 0.61–1.24)
GFR, Estimated: 4 mL/min — ABNORMAL LOW (ref >60)
Glucose, Bld: 125 mg/dL — ABNORMAL HIGH (ref 70–99)
Phosphorus: 5.7 mg/dL — ABNORMAL HIGH (ref 2.5–4.6)
Potassium: 4.8 mmol/L (ref 3.5–5.1)
Sodium: 132 mmol/L — ABNORMAL LOW (ref 135–145)

## 2022-01-19 LAB — CBC
HCT: 34.6 % — ABNORMAL LOW (ref 39.0–52.0)
Hemoglobin: 11.4 g/dL — ABNORMAL LOW (ref 13.0–17.0)
MCH: 28.4 pg (ref 26.0–34.0)
MCHC: 32.9 g/dL (ref 30.0–36.0)
MCV: 86.3 fL (ref 80.0–100.0)
Platelets: 208 10*3/uL (ref 150–400)
RBC: 4.01 MIL/uL — ABNORMAL LOW (ref 4.22–5.81)
RDW: 15.1 % (ref 11.5–15.5)
WBC: 5 10*3/uL (ref 4.0–10.5)
nRBC: 0 % (ref 0.0–0.2)

## 2022-01-19 LAB — AEROBIC/ANAEROBIC CULTURE W GRAM STAIN (SURGICAL/DEEP WOUND): Gram Stain: NONE SEEN

## 2022-01-19 LAB — GLUCOSE, CAPILLARY
Glucose-Capillary: 103 mg/dL — ABNORMAL HIGH (ref 70–99)
Glucose-Capillary: 87 mg/dL (ref 70–99)

## 2022-01-19 LAB — SURGICAL PATHOLOGY

## 2022-01-19 MED ORDER — AMOXICILLIN-POT CLAVULANATE 500-125 MG PO TABS: 1.0000 | ORAL_TABLET | Freq: Every day | ORAL | 0 refills | Status: AC

## 2022-01-19 MED ORDER — ACETAMINOPHEN 325 MG PO TABS
ORAL_TABLET | ORAL | Status: AC
  Filled 2022-01-19: qty 1

## 2022-01-19 MED ORDER — PENTAFLUOROPROP-TETRAFLUOROETH EX AERO
INHALATION_SPRAY | CUTANEOUS | Status: AC
  Filled 2022-01-19: qty 30

## 2022-01-19 MED ORDER — ACETAMINOPHEN 325 MG PO TABS
325.0000 mg | ORAL_TABLET | Freq: Once | ORAL | Status: AC
  Administered 2022-01-19: 325 mg via ORAL

## 2022-01-19 MED ORDER — LOSARTAN POTASSIUM 25 MG PO TABS: 25.0000 mg | ORAL_TABLET | Freq: Every day | ORAL | 0 refills | Status: DC

## 2022-01-19 MED ORDER — HYDROCODONE-ACETAMINOPHEN 10-325 MG PO TABS: 0.5000 | ORAL_TABLET | Freq: Four times a day (QID) | ORAL | 0 refills | Status: DC | PRN

## 2022-01-19 NOTE — Progress Notes (Addendum)
[facility]  PROGRESS NOTE   Subjective:   Patient seen and evaluated during dialysis   HEMODIALYSIS FLOWSHEET:  Blood Flow Rate (mL/min): 400 mL/min Arterial Pressure (mmHg): -200 mmHg Venous Pressure (mmHg): 250 mmHg TMP (mmHg): -5 mmHg Ultrafiltration Rate (mL/min): 686 mL/min Dialysate Flow Rate (mL/min): 300 ml/min Dialysis Fluid Bolus: Normal Saline Bolus Amount (mL): 300 mL  Tolerating treatment well Continues to complain of pain at right leg  Objective:  Vital signs: Blood pressure 108/66, pulse 70, temperature 98.4 F (36.9 C), temperature source Oral, resp. rate (!) 25, height 6\' 3"  (1.905 m), weight 113.8 kg, SpO2 99 %.  Intake/Output Summary (Last 24 hours) at 01/19/2022 1053 Last data filed at 01/19/2022 0557 Gross per 24 hour  Intake 0 ml  Output 50 ml  Net -50 ml    Filed Weights   01/13/22 1842 01/14/22 1105 01/16/22 1814  Weight: 117.9 kg 114.3 kg 113.8 kg     Physical Exam: General:  No acute distress  Head:  Normocephalic, atraumatic. Moist oral mucosal membranes  Eyes:  Anicteric  Lungs:   Clear to auscultation, normal effort  Heart:  S1S2 no rubs  Abdomen:   Soft, nontender, bowel sounds present  Extremities: 1+ peripheral edema.  Neurologic:  Awake, alert, following commands  Skin: Right leg ulcer, NPWV  Access: Left aVF    Basic Metabolic Panel: Recent Labs  Lab 01/12/22 2148 01/14/22 0902 01/15/22 0922 01/17/22 0543  NA 134* 130*  130* 131* 132*  K 5.1 5.1  5.0 4.7 5.0  CL 91* 88*  89* 91* 94*  CO2 28 26  25 27 29   GLUCOSE 80 123*  119* 103* 107*  BUN 46* 75*  74* 51* 41*  CREATININE 10.50* 13.07*  12.96* 9.80* 9.10*  CALCIUM 9.0 8.4*  8.4* 8.0* 8.1*  PHOS  --  8.7* 6.4*  --      CBC: Recent Labs  Lab 01/12/22 2148 01/14/22 0902 01/17/22 0543  WBC 5.7 6.7 5.7  NEUTROABS 3.8 4.6  --   HGB 15.8 14.4 13.2  HCT 47.4 43.5 41.1  MCV 85.9 86.0 87.6  PLT 235 238 210      Urinalysis: No results  for input(s): "COLORURINE", "LABSPEC", "PHURINE", "GLUCOSEU", "HGBUR", "BILIRUBINUR", "KETONESUR", "PROTEINUR", "UROBILINOGEN", "NITRITE", "LEUKOCYTESUR" in the last 72 hours.  Invalid input(s): "APPERANCEUR"    Imaging: No results found.   Medications:    anticoagulant sodium citrate      acetaminophen       amiodarone  200 mg Oral BID   amoxicillin-clavulanate  1 tablet Oral q1800   apixaban  2.5 mg Oral BID   bumetanide  1 mg Oral BID   carvedilol  3.125 mg Oral BID   gabapentin  300 mg Oral BID   insulin aspart  0-15 Units Subcutaneous TID WC   isosorbide mononitrate  30 mg Oral Daily   losartan  25 mg Oral Daily   NIFEdipine  30 mg Oral Daily   pentafluoroprop-tetrafluoroeth       rosuvastatin  40 mg Oral Daily   sertraline  50 mg Oral Daily   sevelamer carbonate  1,600 mg Oral TID WC    Assessment/ Plan:     Principal Problem:   Ulcer of right lower extremity (HCC) Active Problems:   Type 2 diabetes mellitus with ESRD (end-stage renal disease) (HCC)   Heart failure with reduced ejection fraction (HCC)   Atrial fibrillation (HCC)   Diabetic ulcer of right lower leg ([place])  61 year old  male with past medical conditions including diabetes, hyperlipidemia, hypertension, CHF, and end-stage renal disease on hemodialysis.  Patient presents to the emergency department with leg pain and will be admitted for Wound infection [T14.8XXA, L08.9] Diabetic ulcer of right lower leg ([place]) [D92.426, L97.919]  [facility]/MWF/left aVF    #1: End-stage renal disease on hemodialysis.  Patient receiving scheduled dialysis treatment today, UF goal 1-1.5 L as tolerated.  Next treatment scheduled for Wednesday.   #2: Second hyperparathyroidism: Awaiting morning labs   #3: Hypertension/congestive heart failure: We will continue the fluid tonight, carvedilol, losartan and nifedipine.  Blood pressure stable 66.  #4: Diabetes melitis type II with chronic kidney disease: Glucose  well controlled.  Primary team to continue sliding scale insulin.   #5: Nonhealing leg ulcers: S/p wound debridement.  Transition to oral antibiotics, Augmentin daily with wound VAC in place.  Pain management      LOS: [facility] 11/6/202310:53 AM

## 2022-01-19 NOTE — Progress Notes (Signed)
PT did 3.5 hrs of HD, tolerated well with no signs of distress  UF = 1500 ml  Access had no issues, thrill and bruit present   01/19/22 1333  Vitals  Temp 97.8 F (36.6 C)  Temp Source Oral  BP 124/71  MAP (mmHg) 85  BP Location Right Arm  BP Method Automatic  Patient Position (if appropriate) Lying  Pulse Rate 67  Pulse Rate Source Monitor  ECG Heart Rate 61  Resp 13  Oxygen Therapy  SpO2 99 %  O2 Device Room Air  Patient Activity (if Appropriate) In bed  Pulse Oximetry Type Continuous  Post Treatment  Dialyzer Clearance Lightly streaked  Duration of HD Treatment -hour(s) 3.5 hour(s)  Hemodialysis Intake (mL) 0 mL  Liters Processed 84  Fluid Removed (mL) 1500 mL  Tolerated HD Treatment Yes  Post-Hemodialysis Comments tx complete, tolerated well  AVG/AVF Arterial Site Held (minutes) 10 minutes  AVG/AVF Venous Site Held (minutes) 10 minutes  Fistula / Graft Left Forearm Arteriovenous fistula  No placement date or time found.   Placed prior to admission: Yes  Orientation: Left  Access Location: Forearm  Access Type: Arteriovenous fistula  Site Condition No complications  Fistula / Graft Assessment Present;Thrill;Bruit  Status Deaccessed  Drainage Description None

## 2022-01-19 NOTE — Care Management Important Message (Signed)
Important Message  Patient Details  Name: Adam Howell MRN: [id number] Date of Birth: [birth year]   Medicare Important Message Given:  Yes     Juliann Pulse A [name] 01/19/2022, 12:43 PM

## 2022-01-19 NOTE — Progress Notes (Signed)
Patient discharging home, daughter to be coming to get him. IV removed, discharge instructions given to patient, verbalized understanding.

## 2022-01-19 NOTE — Progress Notes (Signed)
Patient back from dialysis. I spoke with daughter on the phone and told her the plan to d/c home. Daughter states she wants his pain controlled and for him to be able to walk around, or she will have to bring him back to the hospital again. She said he has his other pain pills there but they didn't help. I told her I gave him a pain pill, we will make sure he can get up and walk some, and if so I will call her back to come transport him home. She was agreeable. Dr Sheppard Coil aware.

## 2022-01-19 NOTE — Care Management CC44 (Deleted)
Condition Code 44 Documentation Completed  Patient Details  Name: Adam Howell MRN: [id number] Date of Birth: 09/09/1960   Condition Code 44 given:  Yes Patient signature on Condition Code 44 notice:  Yes Documentation of 2 MD's agreement:  Yes Code 44 added to claim:  Yes    Conception Oms, RN 01/19/2022, 4:14 PM

## 2022-01-19 NOTE — Progress Notes (Signed)
OT Cancellation Note  Patient Details Name: Adam Howell MRN: [id number] DOB: [birth year]   Cancelled Treatment:    Reason Eval/Treat Not Completed: Patient at procedure or test/ unavailable. Pt out of the room for dialysis. Will re-attempt OT tx at later time as pt is available.   Ardeth Perfect., MPH, MS, OTR/L ascom [phone] 01/19/22, 10:28 AM

## 2022-01-19 NOTE — Discharge Summary (Signed)
Physician Discharge Summary   Patient: Adam Howell MRN: [id number]  DOB: Jul 13, 1960   Admit:     Date of Admission: 01/13/2022 Admitted from: home   Discharge: Date of discharge: 01/19/22 Disposition: Home health Condition at discharge: good  CODE STATUS: FULL CODE      Discharge Physician: Emeterio Reeve, DO [facility]     PCP: Venita Lick, NP  Recommendations for Outpatient Follow-up:  Follow up with PCP Venita Lick, NP in 1-2 weeks Follow w/ wound care center for wound check in 1 weeks  Please follow up on the following pending results: none PCP AND OTHER OUTPATIENT PROVIDERS: SEE BELOW FOR SPECIFIC DISCHARGE INSTRUCTIONS PRINTED FOR PATIENT IN ADDITION TO GENERIC AVS PATIENT INFO    Discharge Instructions     Call MD for:  difficulty breathing, headache or visual disturbances   Complete by: As directed    Call MD for:  persistant dizziness or light-headedness   Complete by: As directed    Call MD for:  redness, tenderness, or signs of infection (pain, swelling, redness, odor or green/yellow discharge around incision site)   Complete by: As directed    Call MD for:  severe uncontrolled pain   Complete by: As directed    Call MD for:  temperature >100.4   Complete by: As directed    Diet - low sodium heart healthy   Complete by: As directed    Discharge wound care:   Complete by: As directed    PER WOUND VAC HOME RN PROTOCOL / SURGERY INSTRUCTIONS   Increase activity slowly   Complete by: As directed          Discharge Diagnoses: Principal Problem:   Ulcer of right lower extremity ([place]) Active Problems:   Type 2 diabetes mellitus with ESRD (end-stage renal disease) ([place])   Heart failure with reduced ejection fraction ([place])   Atrial fibrillation ([place])   Diabetic ulcer of right lower leg [facility])       Hospital Course: Waleed Dettman is a 61 y.o. male with medical history significant for diabetes mellitus with  complications of end-stage renal disease on hemodialysis (M/W/F), history of A-fib on chronic anticoagulation therapy, chronic pain syndrome, secondary hyperparathyroidism  He was recently admitted and discharged from the hospital on 01/07/22 for a nonhealing right lower extremity wound.  Wound culture from his right lower extremity yielded Proteus mirabilis and Klebsiella oxytoca and patient was discharged on Keflex. 10/31 He returns to the emergency room  for increasing pain, swelling and increased purulent drainage from his right leg ulcer since his hospitalization/discharge despite taking his antibiotics as prescribed. Admitted to the hospital for evaluation of worsening, nonhealing right lower extremity ulcer. Cultures repeated, started on rocephin and vancomycin  11/01-11/02, cultures pending/reincubated, remains on abx. MRSA screen neg. Consult surgery for possible debridement 11/03: Debridement RLE wound/ulcer in OR w/ Dr Delana Meyer. Initial cultures back - narrowed Abx to Unasyn   11/04: pend PT/OT eval (today is Saturday) expect will need HH/SNF  11/05: transition to po abx, TOC to arrange [facility] and can d/c once this is confirmed --> wound vac will be delivered to pt tomorrow, should be ok to discharge after dialysis tm  11/06: remains stable for discharge following dialysis and confirmation of home health equipment / staff    Consultants:  Nephrology  Procedures: 01/16/22: Debridement RLE wound/ulcer in OR w/ Dr Delana Meyer       ASSESSMENT & PLAN:   Principal Problem:  Ulcer of right lower extremity (HCC) Active Problems:   Type 2 diabetes mellitus with ESRD (end-stage renal disease) (HCC)   Heart failure with reduced ejection fraction (HCC)   Atrial fibrillation (HCC)   Ulcer of right lower extremity (HCC) Cellulitis  Wound infection  Possible calciphylaxis  Patient has a full-thickness wound over the right anterior tibia with black eschar initially thought to be related to  calciphylaxis. Wound appears to be infected and has increased surrounding erythema with purulent drainage Debridement RLE wound/ulcer in OR w/ Dr Delana Meyer 01/16/22  Wound vac in place Cultures (+) --> abx adjusted  TOC to arrange HH  Type 2 diabetes mellitus with ESRD (end-stage renal disease) ([place]) Patient has type 2 diabetes mellitus with complications of end-stage renal disease Patient's  last hemoglobin A1c was 5.6 Maintain consistent carbohydrate diet Nephrology consulted Dialysis Monday/Wednesday/Friday  Heart failure with reduced ejection fraction (HCC) Stable and not acutely exacerbated Last known LVEF from 2021 was 35 to 40% Continue losartan, Bumex and beta blocker  Reduced losartan d/t soft BP  Continue renal replacement therapy to optimize volume status  Atrial fibrillation (HCC) Continue amiodarone and carvedilol for rate control Continue Eliquis            Discharge Instructions  Allergies as of 01/19/2022       Reactions   Tramadol Rash        Medication List     STOP taking these medications    cephALEXin 250 MG capsule Commonly known as: KEFLEX   silver sulfADIAZINE 1 % cream Commonly known as: SILVADENE   zolpidem 10 MG tablet Commonly known as: AMBIEN       TAKE these medications    amiodarone 200 MG tablet Commonly known as: PACERONE Take 1 tablet (200 mg total) by mouth 2 (two) times daily.   amoxicillin-clavulanate 500-125 MG tablet Commonly known as: AUGMENTIN Take 1 tablet by mouth daily at 6 PM for 7 days. On dialysis days, give AFTER dialysis   bumetanide 1 MG tablet Commonly known as: BUMEX Take 1 tablet (1 mg total) by mouth 2 (two) times daily.   carvedilol 3.125 MG tablet Commonly known as: COREG Take 1 tablet (3.125 mg total) by mouth 2 (two) times daily.   Eliquis 2.5 MG Tabs tablet Generic drug: apixaban Take 1 tablet (2.5 mg total) by mouth 2 (two) times daily. Sig: Daily as presciribed.   gabapentin 300  MG capsule Commonly known as: NEURONTIN Take 1 capsule (300 mg total) by mouth 2 (two) times daily.   HYDROcodone-acetaminophen 10-325 MG tablet Commonly known as: NORCO Take 1 tablet by mouth every 8 (eight) hours as needed.   insulin aspart 100 UNIT/ML FlexPen Commonly known as: NOVOLOG Inject 3 Units into the skin 3 (three) times daily with meals. As needed only if blood sugar greater then 130 prior to meal.   isosorbide mononitrate 30 MG 24 hr tablet Commonly known as: IMDUR Take 1 tablet (30 mg total) by mouth daily.   losartan 25 MG tablet Commonly known as: COZAAR Take 1 tablet (25 mg total) by mouth daily. Start taking on: January 20, 2022 What changed:  medication strength how much to take   NIFEdipine 30 MG 24 hr tablet Commonly known as: PROCARDIA-XL/NIFEDICAL-XL Take 1 tablet (30 mg total) by mouth daily.   rosuvastatin 40 MG tablet Commonly known as: CRESTOR Take 1 tablet (40 mg total) by mouth daily.   sertraline 50 MG tablet Commonly known as: ZOLOFT Take 50 mg by mouth  daily.   sevelamer carbonate 800 MG tablet Commonly known as: RENVELA Take 2 tablets (1,600 mg total) by mouth 3 (three) times daily.   tiZANidine 2 MG tablet Commonly known as: ZANAFLEX Take 1 tablet (2 mg total) by mouth daily as needed for muscle spasms.   traZODone 50 MG tablet Commonly known as: DESYREL Take 1 tablet (50 mg total) by mouth at bedtime as needed for sleep.               Durable Medical Equipment  (From admission, onward)           Start     Ordered   01/18/22 0939  For home use only DME Bedside commode  Once       Question Answer Comment  Patient needs a bedside commode to treat with the following condition Muscle weakness (generalized)   Patient needs a bedside commode to treat with the following condition Difficulty in walking, not elsewhere classified      01/18/22 0940   01/18/22 0939  For home use only DME 3 n 1  Once       Comments: Patient  is at risk of falling when trying to shower standing up. He is unable to safely navigate bathing while standing the the shower. A 3in1 BSC will alleviate this problem.   01/18/22 0940   01/18/22 0938  For home use only DME Walker rolling  Once       Question Answer Comment  Walker: With [place]   Patient needs a walker to treat with the following condition Muscle weakness (generalized)   Patient needs a walker to treat with the following condition Difficulty in walking, not elsewhere classified      01/18/22 0940              Discharge Care Instructions  (From admission, onward)           Start     Ordered   01/19/22 0000  Discharge wound care:       Comments: PER WOUND VAC HOME RN PROTOCOL / SURGERY INSTRUCTIONS   01/19/22 1351              Allergies  Allergen Reactions   Tramadol Rash     Subjective: pt feeling well today, pain is controlled no other concerns    Discharge Exam: BP 124/71 (BP Location: Right Arm)   Pulse 67   Temp 97.8 F (36.6 C) (Oral)   Resp 13   Ht 6\' 3"  (1.905 m)   Wt 113.8 kg   SpO2 99%   BMI 31.36 kg/m  General: Pt is alert, awake, not in acute distress Cardiovascular: RRR, S1/S2 +, no rubs, no gallops Respiratory: CTA bilaterally, no wheezing, no rhonchi Abdominal: Soft, NT, ND, bowel sounds + Extremities: no edema, no cyanosis. RLE is reduced swelling from previous, wound vac is in place      The results of significant diagnostics from this hospitalization (including imaging, microbiology, ancillary and laboratory) are listed below for reference.     Microbiology: Recent Results (from the past 240 hour(s))  Aerobic/Anaerobic Culture w Gram Stain (surgical/deep wound)     Status: None   Collection Time: 01/13/22  6:51 AM   Specimen: Abscess  Result Value Ref Range Status   Specimen Description   Final    ABSCESS Performed at [facility], [address], [place], [place] [address]    Special  Requests   Final    NONE  Performed at [facility], [address] [place], [place] [address]    Gram Stain   Final    NO WBC SEEN FEW GRAM NEGATIVE RODS FEW GRAM POSITIVE COCCI IN CHAINS RARE GRAM POSITIVE RODS    Culture   Final    MODERATE PROTEUS MIRABILIS MODERATE KLEBSIELLA OXYTOCA MODERATE ENTEROCOCCUS CASSELIFLAVUS MODERATE BACTEROIDES THETAIOTAOMICRON BETA LACTAMASE POSITIVE Performed at [facility], [place] [address] [place], [place] [address]    Report Status 01/19/2022 FINAL  Final   Organism ID, Bacteria PROTEUS MIRABILIS  Final   Organism ID, Bacteria KLEBSIELLA OXYTOCA  Final   Organism ID, Bacteria ENTEROCOCCUS CASSELIFLAVUS  Final      Susceptibility   Enterococcus casseliflavus - MIC*    AMPICILLIN <=2 SENSITIVE Sensitive     VANCOMYCIN RESISTANT Resistant     GENTAMICIN SYNERGY SENSITIVE Sensitive     LINEZOLID 1 SENSITIVE Sensitive     * MODERATE ENTEROCOCCUS CASSELIFLAVUS   Klebsiella oxytoca - MIC*    AMPICILLIN RESISTANT Resistant     CEFAZOLIN <=4 SENSITIVE Sensitive     CEFEPIME <=0.12 SENSITIVE Sensitive     CEFTAZIDIME <=1 SENSITIVE Sensitive     CEFTRIAXONE <=0.25 SENSITIVE Sensitive     CIPROFLOXACIN <=0.25 SENSITIVE Sensitive     GENTAMICIN <=1 SENSITIVE Sensitive     IMIPENEM <=0.25 SENSITIVE Sensitive     TRIMETH/SULFA <=20 SENSITIVE Sensitive     AMPICILLIN/SULBACTAM 4 SENSITIVE Sensitive     PIP/TAZO <=4 SENSITIVE Sensitive     * MODERATE KLEBSIELLA OXYTOCA   Proteus mirabilis - MIC*    AMPICILLIN <=2 SENSITIVE Sensitive     CEFAZOLIN 8 SENSITIVE Sensitive     CEFEPIME <=0.12 SENSITIVE Sensitive     CEFTAZIDIME <=1 SENSITIVE Sensitive     CEFTRIAXONE <=0.25 SENSITIVE Sensitive     CIPROFLOXACIN <=0.25 SENSITIVE Sensitive     GENTAMICIN <=1 SENSITIVE Sensitive     IMIPENEM 2 SENSITIVE Sensitive     TRIMETH/SULFA <=20 SENSITIVE Sensitive     AMPICILLIN/SULBACTAM <=2 SENSITIVE Sensitive     PIP/TAZO <=4 SENSITIVE  Sensitive     * MODERATE PROTEUS MIRABILIS  MRSA Next Gen by PCR, Nasal     Status: None   Collection Time: 01/14/22  8:47 AM   Specimen: Nasal Mucosa; Nasal Swab  Result Value Ref Range Status   MRSA by PCR Next Gen NOT DETECTED NOT DETECTED Final    Comment: (NOTE) The GeneXpert MRSA Assay (FDA approved for NASAL specimens only), is one component of a comprehensive MRSA colonization surveillance program. It is not intended to diagnose MRSA infection nor to guide or monitor treatment for MRSA infections. Test performance is not FDA approved in patients less than 42 years old. Performed at [facility], [address] [place], [place] [address]   Aerobic/Anaerobic Culture w Gram Stain (surgical/deep wound)     Status: None (Preliminary result)   Collection Time: 01/16/22 11:12 AM   Specimen: Wound; Tissue  Result Value Ref Range Status   Specimen Description   Final    WOUND Performed at [facility], [address] [place], [place] [address]    Special Requests   Final    NONE Performed at [facility], [address] [place], [place] [address]    Gram Stain   Final    NO WBC SEEN FEW GRAM NEGATIVE RODS FEW GRAM POSITIVE COCCI IN CLUSTERS Performed at [facility], [place] [address] [place], [place] [address]  Culture   Final    MODERATE PROTEUS MIRABILIS FEW KLEBSIELLA OXYTOCA SUSCEPTIBILITIES PERFORMED ON PREVIOUS CULTURE WITHIN THE LAST 5 DAYS. NO ANAEROBES ISOLATED; CULTURE IN PROGRESS FOR 5 DAYS    Report Status PENDING  Incomplete     Labs: BNP (last 3 results) No results for input(s): "BNP" in the last 8760 hours. Basic Metabolic Panel: Recent Labs  Lab 01/12/22 2148 01/14/22 0902 01/15/22 0922 01/17/22 0543  NA 134* 130*  130* 131* 132*  K 5.1 5.1  5.0 4.7 5.0  CL 91* 88*  89* 91* 94*  CO2 28 26  25 27 29   GLUCOSE 80 123*  119* 103* 107*  BUN 46* 75*  74* 51* 41*  CREATININE 10.50* 13.07*  12.96* 9.80*  9.10*  CALCIUM 9.0 8.4*  8.4* 8.0* 8.1*  PHOS  --  8.7* 6.4*  --    Liver Function Tests: Recent Labs  Lab 01/12/22 2148 01/14/22 0902 01/15/22 0922  AST 20  --   --   ALT 14  --   --   ALKPHOS 72  --   --   BILITOT 0.9  --   --   PROT 8.9*  --   --   ALBUMIN 3.7 3.1* 2.8*   No results for input(s): "LIPASE", "AMYLASE" in the last 168 hours. No results for input(s): "AMMONIA" in the last 168 hours. CBC: Recent Labs  Lab 01/12/22 2148 01/14/22 0902 01/17/22 0543  WBC 5.7 6.7 5.7  NEUTROABS 3.8 4.6  --   HGB 15.8 14.4 13.2  HCT 47.4 43.5 41.1  MCV 85.9 86.0 87.6  PLT 235 238 210   Cardiac Enzymes: No results for input(s): "CKTOTAL", "CKMB", "CKMBINDEX", "TROPONINI" in the last 168 hours. BNP: Invalid input(s): "POCBNP" CBG: Recent Labs  Lab 01/17/22 2121 01/18/22 0740 01/18/22 1119 01/18/22 2104 01/19/22 0724  GLUCAP 106* 90 145* 110* 103*   D-Dimer No results for input(s): "DDIMER" in the last 72 hours. Hgb A1c No results for input(s): "HGBA1C" in the last 72 hours. Lipid Profile No results for input(s): "CHOL", "HDL", "LDLCALC", "TRIG", "CHOLHDL", "LDLDIRECT" in the last 72 hours. Thyroid function studies No results for input(s): "TSH", "T4TOTAL", "T3FREE", "THYROIDAB" in the last 72 hours.  Invalid input(s): "FREET3" Anemia work up No results for input(s): "VITAMINB12", "FOLATE", "FERRITIN", "TIBC", "IRON", "RETICCTPCT" in the last 72 hours. Urinalysis No results found for: "COLORURINE", "APPEARANCEUR", "LABSPEC", "PHURINE", "GLUCOSEU", "HGBUR", "BILIRUBINUR", "KETONESUR", "PROTEINUR", "UROBILINOGEN", "NITRITE", "LEUKOCYTESUR" Sepsis Labs Recent Labs  Lab 01/12/22 2148 01/14/22 0902 01/17/22 0543  WBC 5.7 6.7 5.7   Microbiology Recent Results (from the past 240 hour(s))  Aerobic/Anaerobic Culture w Gram Stain (surgical/deep wound)     Status: None   Collection Time: 01/13/22  6:51 AM   Specimen: Abscess  Result Value Ref Range Status    Specimen Description   Final    ABSCESS Performed at [facility], [address] [place], [place] [address]    Special Requests   Final    NONE Performed at [place] [place], [place] [address]    Gram Stain   Final    NO WBC SEEN FEW GRAM NEGATIVE RODS FEW GRAM POSITIVE COCCI IN CHAINS RARE GRAM POSITIVE RODS    Culture   Final    MODERATE PROTEUS MIRABILIS MODERATE KLEBSIELLA OXYTOCA MODERATE ENTEROCOCCUS CASSELIFLAVUS MODERATE BACTEROIDES THETAIOTAOMICRON BETA LACTAMASE POSITIVE Performed at [facility], [place] [address], [place], [place] [address]    Report Status 01/19/2022 FINAL  Final   Organism ID, Bacteria PROTEUS MIRABILIS  Final   Organism ID, Bacteria KLEBSIELLA OXYTOCA  Final   Organism ID, Bacteria ENTEROCOCCUS CASSELIFLAVUS  Final      Susceptibility   Enterococcus casseliflavus - MIC*    AMPICILLIN <=2 SENSITIVE Sensitive     VANCOMYCIN RESISTANT Resistant     GENTAMICIN SYNERGY SENSITIVE Sensitive     LINEZOLID 1 SENSITIVE Sensitive     * MODERATE ENTEROCOCCUS CASSELIFLAVUS   Klebsiella oxytoca - MIC*    AMPICILLIN RESISTANT Resistant     CEFAZOLIN <=4 SENSITIVE Sensitive     CEFEPIME <=0.12 SENSITIVE Sensitive     CEFTAZIDIME <=1 SENSITIVE Sensitive     CEFTRIAXONE <=0.25 SENSITIVE Sensitive     CIPROFLOXACIN <=0.25 SENSITIVE Sensitive     GENTAMICIN <=1 SENSITIVE Sensitive     IMIPENEM <=0.25 SENSITIVE Sensitive     TRIMETH/SULFA <=20 SENSITIVE Sensitive     AMPICILLIN/SULBACTAM 4 SENSITIVE Sensitive     PIP/TAZO <=4 SENSITIVE Sensitive     * MODERATE KLEBSIELLA OXYTOCA   Proteus mirabilis - MIC*    AMPICILLIN <=2 SENSITIVE Sensitive     CEFAZOLIN 8 SENSITIVE Sensitive     CEFEPIME <=0.12 SENSITIVE Sensitive     CEFTAZIDIME <=1 SENSITIVE Sensitive     CEFTRIAXONE <=0.25 SENSITIVE Sensitive     CIPROFLOXACIN <=0.25 SENSITIVE Sensitive     GENTAMICIN <=1 SENSITIVE Sensitive     IMIPENEM 2 SENSITIVE  Sensitive     TRIMETH/SULFA <=20 SENSITIVE Sensitive     AMPICILLIN/SULBACTAM <=2 SENSITIVE Sensitive     PIP/TAZO <=4 SENSITIVE Sensitive     * MODERATE PROTEUS MIRABILIS  MRSA Next Gen by PCR, Nasal     Status: None   Collection Time: 01/14/22  8:47 AM   Specimen: Nasal Mucosa; Nasal Swab  Result Value Ref Range Status   MRSA by PCR Next Gen NOT DETECTED NOT DETECTED Final    Comment: (NOTE) The GeneXpert MRSA Assay (FDA approved for NASAL specimens only), is one component of a comprehensive MRSA colonization surveillance program. It is not intended to diagnose MRSA infection nor to guide or monitor treatment for MRSA infections. Test performance is not FDA approved in patients less than 31 years old. Performed at [facility], [address] [place], [place] [address]   Aerobic/Anaerobic Culture w Gram Stain (surgical/deep wound)     Status: None (Preliminary result)   Collection Time: 01/16/22 11:12 AM   Specimen: Wound; Tissue  Result Value Ref Range Status   Specimen Description   Final    WOUND Performed at [facility], [address] [place], [place] [address]    Special Requests   Final    NONE Performed at [facility], [address] [place], [place] [address]    Gram Stain   Final    NO WBC SEEN FEW GRAM NEGATIVE RODS FEW GRAM POSITIVE COCCI IN CLUSTERS Performed at [facility], [place] [address], [place], [place] [address]    Culture   Final    MODERATE PROTEUS MIRABILIS FEW KLEBSIELLA OXYTOCA SUSCEPTIBILITIES PERFORMED ON PREVIOUS CULTURE WITHIN THE LAST 5 DAYS. NO ANAEROBES ISOLATED; CULTURE IN PROGRESS FOR 5 DAYS    Report Status PENDING  Incomplete   Imaging US Venous Img Lower Unilateral Right  Result Date: 01/13/2022 CLINICAL DATA:  RIGHT lower extremity pain and swelling, RIGHT leg wound, question deep venous thrombosis EXAM: RIGHT LOWER EXTREMITY VENOUS DOPPLER ULTRASOUND TECHNIQUE: Gray-scale sonography with  compression, as well as  color and duplex ultrasound, were performed to evaluate the deep venous system(s) from the level of the common femoral vein through the popliteal and proximal calf veins. COMPARISON:  None Available. FINDINGS: VENOUS Normal compressibility of the common femoral, superficial femoral, and popliteal veins, as well as the visualized calf veins. Visualized portions of profunda femoral vein and great saphenous vein unremarkable. No filling defects to suggest DVT on grayscale or color Doppler imaging. Doppler waveforms show normal direction of venous flow, normal respiratory plasticity and response to augmentation. Limited views of the contralateral common femoral vein are unremarkable. OTHER None. Limitations: none IMPRESSION: No evidence of deep venous thrombosis in the RIGHT lower extremity. Electronically Signed   By: Lavonia Dana M.D.   On: 01/13/2022 08:32      Time coordinating discharge: over 30 minutes  SIGNED:  Emeterio Reeve DO [facility]

## 2022-01-19 NOTE — Plan of Care (Signed)

## 2022-01-19 NOTE — TOC Progression Note (Signed)
Transition of Care [facility]) - Progression Note    Patient Details  Name: Adam Howell MRN: [id number] Date of Birth: 18-Jan-1961  Transition of Care [facility]) CM/SW [place], RN Phone Number: 01/19/2022, 9:49 AM  Clinical Narrative:   Reached out to [place] at 67 M to gt the wound vac with measurement of the wound 10/9 CM, She will send the escript to Dr Delana Meyer for signing and arrange for delivery, I explained we hope to DC today, He is set up with [facility] for [facility]    Expected Discharge Plan: [place] Barriers to Discharge: Continued Medical Work up  Expected Discharge Plan and Services Expected Discharge Plan: [place] arrangements for the past 2 months: Single Family Home                 DME Arranged: 3-N-1, Walker rolling DME Agency: [employer] Date DME Agency Contacted: 01/18/22   Representative spoke with at DME Agency: [place]: PT [place]: [place] ([place]) Date [place]: 01/18/22   Representative spoke with at [place]: [place] ([place]) Interventions    Readmission Risk Interventions     No data to display

## 2022-01-20 ENCOUNTER — Telehealth: Payer: Self-pay

## 2022-01-20 LAB — AEROBIC/ANAEROBIC CULTURE W GRAM STAIN (SURGICAL/DEEP WOUND): Gram Stain: NONE SEEN

## 2022-01-20 NOTE — Telephone Encounter (Signed)
Patient has been discharged from the hospital on 01/19/22. Patient requested orders have been signed and faxed back over to [facility] today upon provider's return back in office.

## 2022-01-20 NOTE — Anesthesia Postprocedure Evaluation (Signed)
Anesthesia Post Note  Patient: Adam Howell  Procedure(s) Performed: DEBRIDEMENT WOUND (Right)  Patient location during evaluation: PACU Anesthesia Type: General Level of consciousness: awake and alert Pain management: pain level controlled Vital Signs Assessment: post-procedure vital signs reviewed and stable Respiratory status: spontaneous breathing, nonlabored ventilation, respiratory function stable and patient connected to nasal cannula oxygen Cardiovascular status: blood pressure returned to baseline and stable Postop Assessment: no apparent nausea or vomiting Anesthetic complications: no   No notable events documented.   Last Vitals:  Vitals:   01/19/22 1416 01/19/22 1548  BP:  117/87  Pulse:  69  Resp:  18  Temp:  36.7 C  SpO2: 94% 100%    Last Pain:  Vitals:   01/19/22 1501  TempSrc:   PainSc: 9                  Martha Clan

## 2022-01-20 NOTE — Telephone Encounter (Signed)
Transition Care Management Follow-up Telephone Call Date of discharge and from where: 01/19/22, [facility] How have you been since you were released from the hospital? Patient states he is feeling a little bit better. Any questions or concerns? No  Items Reviewed: Did the pt receive and understand the discharge instructions provided? Yes  Medications obtained and verified? Yes  Other? No  Any new allergies since your discharge? No  Dietary orders reviewed? No Do you have support at home? Yes   Home Care and Equipment/Supplies: Were home health services ordered? yes If so, what is the name of the agency? [facility]  Has the agency set up a time to come to the patient's home? yes Were any new equipment or medical supplies ordered?  Yes:   What is the name of the medical supply agency? [facility] Were you able to get the supplies/equipment? yes Do you have any questions related to the use of the equipment or supplies? No  Functional Questionnaire: (I = Independent and D = Dependent) ADLs: I  Bathing/Dressing- I  Meal Prep- I  Eating- I  Maintaining continence- I  Transferring/Ambulation- I  Managing Meds- I  Follow up appointments reviewed:  PCP Hospital f/u appt confirmed? Yes  Scheduled to see Talitha Givens, PA on 01/22/22 at 1:40 pm. [facility] f/u appt confirmed? No . Are transportation arrangements needed? No  If their condition worsens, is the pt aware to call PCP or go to the Emergency Dept.? Yes Was the patient provided with contact information for the PCP's office or ED? Yes Was to pt encouraged to call back with questions or concerns? Yes

## 2022-01-22 ENCOUNTER — Telehealth (INDEPENDENT_AMBULATORY_CARE_PROVIDER_SITE_OTHER): Payer: Self-pay

## 2022-01-22 ENCOUNTER — Ambulatory Visit: Payer: Medicare Other | Admitting: Physician Assistant

## 2022-01-22 NOTE — Telephone Encounter (Signed)
Cheryl with [facility] LVM stating she needed orders for wound vac and his next appt date. I spoke with Eulogio Ditch NP and she states the wound vac should be changed 3 times a week and that he should be following up with wound care.  I let Charyl know this and she will call us back if she needs anything else.

## 2022-01-23 ENCOUNTER — Ambulatory Visit: Payer: Medicare Other | Admitting: Physician Assistant

## 2022-01-23 ENCOUNTER — Encounter: Payer: Self-pay | Admitting: Physician Assistant

## 2022-01-23 ENCOUNTER — Ambulatory Visit (INDEPENDENT_AMBULATORY_CARE_PROVIDER_SITE_OTHER): Payer: Medicare Other | Admitting: Physician Assistant

## 2022-01-23 VITALS — BP 80/48 | HR 86 | Temp 98.5°F | Wt 259.0 lb

## 2022-01-23 DIAGNOSIS — L97919 Non-pressure chronic ulcer of unspecified part of right lower leg with unspecified severity: Secondary | ICD-10-CM | POA: Diagnosis not present

## 2022-01-23 NOTE — Progress Notes (Unsigned)
Established Patient Office Visit  Name: Adam Howell   MRN: [id number]    DOB: 1960-08-17   Date:01/26/2022  Today's Provider: Talitha Givens, MHS, PA-C Introduced myself to the patient as a PA-C and provided education on APPs in clinical practice.         Subjective  Chief Complaint  Chief Complaint  Patient presents with   Hospitalization Follow-up    Patient is here for hospital follow up on diabetic ulcer on right shin. Patient states it is very painful.     HPI   Reports he is feeling okay but is having a lot of pain in his leg States his wound vac was full this AM and he had to change it out  Reports pain is located over wound vac area He states he was waiting for Wound Care to call him today about his apt- they closed at noon today so he was not seen by them today  Reports he had dialysis this AM and he thinks they pulled about 3 kg off  BP is a bit low today but he denies feeling lightheaded, dizzy or SOB  He reports he is taking his Abx as directed and has not had any issues with this so far.    Transition of Care Hospital Follow up.   Hospital/Facility: [facility]  D/C Physician: Emeterio Reeve, DO  D/C Date: 01/19/22  Records Requested:  Records Received:  Records Reviewed: DC summary, imaging and lab results   Diagnoses on Discharge: Ulcer of right lower extremity, T2DM with ESRD, heart failure with reduced EF, A. Fib, diabetic ulcer of right lower leg.   Date of interactive Contact within 48 hours of discharge: 01/20/22 Contact was through: phone  Date of 7 day or 14 day face-to-face visit:    within 7 days  Outpatient Encounter Medications as of 01/23/2022  Medication Sig   amiodarone (PACERONE) 200 MG tablet Take 1 tablet (200 mg total) by mouth 2 (two) times daily.   amoxicillin-clavulanate (AUGMENTIN) 500-125 MG tablet Take 1 tablet by mouth daily at 6 PM for 7 days. On dialysis days, give AFTER dialysis   bumetanide (BUMEX) 1 MG tablet  Take 1 tablet (1 mg total) by mouth 2 (two) times daily.   carvedilol (COREG) 3.125 MG tablet Take 1 tablet (3.125 mg total) by mouth 2 (two) times daily.   ELIQUIS 2.5 MG TABS tablet Take 1 tablet (2.5 mg total) by mouth 2 (two) times daily. Sig: Daily as presciribed.   gabapentin (NEURONTIN) 300 MG capsule Take 1 capsule (300 mg total) by mouth 2 (two) times daily.   HYDROcodone-acetaminophen (NORCO) 10-325 MG tablet Take 0.5-1 tablets by mouth every 6 (six) hours as needed for severe pain.   insulin aspart (NOVOLOG) 100 UNIT/ML FlexPen Inject 3 Units into the skin 3 (three) times daily with meals. As needed only if blood sugar greater then 130 prior to meal.   isosorbide mononitrate (IMDUR) 30 MG 24 hr tablet Take 1 tablet (30 mg total) by mouth daily.   losartan (COZAAR) 25 MG tablet Take 1 tablet (25 mg total) by mouth daily.   NIFEdipine (PROCARDIA-XL/NIFEDICAL-XL) 30 MG 24 hr tablet Take 1 tablet (30 mg total) by mouth daily.   rosuvastatin (CRESTOR) 40 MG tablet Take 1 tablet (40 mg total) by mouth daily.   sertraline (ZOLOFT) 50 MG tablet Take 50 mg by mouth daily.   sevelamer carbonate (RENVELA) 800 MG tablet Take 2 tablets (1,600  mg total) by mouth 3 (three) times daily.   tiZANidine (ZANAFLEX) 2 MG tablet Take 1 tablet (2 mg total) by mouth daily as needed for muscle spasms.   [DISCONTINUED] traZODone (DESYREL) 50 MG tablet Take 1 tablet (50 mg total) by mouth at bedtime as needed for sleep. (Patient not taking: Reported on 01/23/2022)   No facility-administered encounter medications on file as of 01/23/2022.    Diagnostic Tests Reviewed/Disposition: Microbiology culture results, CMP, CBC,   Consults: Nephrology   Discharge Instructions: patient was provided with specific follow up instructions to call MD or PCP? For SOB, headache, hallucinations or vision changes, dizziness, signs of infection in wound, severe pain, fever >100.4  Disease/illness Education:   Home  Health/Community Services Discussions/Referrals: Home health referral in place with [place] or re-establishment of referral orders for community resources:  Discussion with other health care providers: NA  Assessment and Support of treatment regimen adherence: He is taking abx as directed and maintaining wound vac per discharge instructions   Appointments Coordinated with: Wound care,   Education for self-management, independent living, and ADLs: pt was instructed to follow heart healthy diet, resume physical activity slowly as tolerated and keep follow up specialty apts.             Patient Active Problem List   Diagnosis Date Noted   Diabetic ulcer of right lower leg ([place]) 01/15/2022   Wound infection 01/13/2022   Controlled substance agreement signed 04/10/2021   Ulcer of right lower extremity ([place]) 02/02/2020   Other thrombophilia ([place]) 01/19/2020   Atrial fibrillation ([place]) 11/11/2019   Hyperlipidemia associated with type 2 diabetes mellitus ([place]) 11/11/2019   Erectile dysfunction 11/11/2019   Heart failure with reduced ejection fraction ([place]) 10/26/2019   Type 2 diabetes mellitus with ESRD (end-stage renal disease) ([place]) 10/04/2019   Chronic pain syndrome 10/04/2019   Insomnia 10/04/2019   Atherosclerotic heart disease of native coronary artery without angina pectoris 09/01/2019   Hypertensive heart and kidney disease with heart failure and chronic kidney disease stage V ([place]) 09/01/2019   Obesity 09/01/2019   Anemia in chronic kidney disease 07/25/2019   End stage renal disease ([place]) 07/25/2019   Secondary hyperparathyroidism of renal origin ([place]) 07/25/2019    Past Surgical History:  Procedure Laterality Date   WOUND DEBRIDEMENT Left    WOUND DEBRIDEMENT Right 01/16/2022   Procedure: DEBRIDEMENT WOUND;  Surgeon: Katha Cabal, MD;  Location: [facility] ORS;  Service: Vascular;  Laterality: Right;  Wound vac placement    Family History   Problem Relation Age of Onset   Heart failure Mother    Cirrhosis Father    Hypertension Daughter     Social History   Tobacco Use   Smoking status: Never   Smokeless tobacco: Never  Substance Use Topics   Alcohol use: Not Currently     Current Outpatient Medications:    amiodarone (PACERONE) 200 MG tablet, Take 1 tablet (200 mg total) by mouth 2 (two) times daily., Disp: 180 tablet, Rfl: 4   amoxicillin-clavulanate (AUGMENTIN) 500-125 MG tablet, Take 1 tablet by mouth daily at 6 PM for 7 days. On dialysis days, give AFTER dialysis, Disp: 7 tablet, Rfl: 0   bumetanide (BUMEX) 1 MG tablet, Take 1 tablet (1 mg total) by mouth 2 (two) times daily., Disp: 90 tablet, Rfl: 4   carvedilol (COREG) 3.125 MG tablet, Take 1 tablet (3.125 mg total) by mouth 2 (two) times daily., Disp: 180 tablet, Rfl: 4  ELIQUIS 2.5 MG TABS tablet, Take 1 tablet (2.5 mg total) by mouth 2 (two) times daily. Sig: Daily as presciribed., Disp: 180 tablet, Rfl: 4   gabapentin (NEURONTIN) 300 MG capsule, Take 1 capsule (300 mg total) by mouth 2 (two) times daily., Disp: 180 capsule, Rfl: 4   HYDROcodone-acetaminophen (NORCO) 10-325 MG tablet, Take 0.5-1 tablets by mouth every 6 (six) hours as needed for severe pain., Disp: 20 tablet, Rfl: 0   insulin aspart (NOVOLOG) 100 UNIT/ML FlexPen, Inject 3 Units into the skin 3 (three) times daily with meals. As needed only if blood sugar greater then 130 prior to meal., Disp: 15 mL, Rfl: 2   isosorbide mononitrate (IMDUR) 30 MG 24 hr tablet, Take 1 tablet (30 mg total) by mouth daily., Disp: 90 tablet, Rfl: 4   losartan (COZAAR) 25 MG tablet, Take 1 tablet (25 mg total) by mouth daily., Disp: 30 tablet, Rfl: 0   NIFEdipine (PROCARDIA-XL/NIFEDICAL-XL) 30 MG 24 hr tablet, Take 1 tablet (30 mg total) by mouth daily., Disp: 90 tablet, Rfl: 4   rosuvastatin (CRESTOR) 40 MG tablet, Take 1 tablet (40 mg total) by mouth daily., Disp: 90 tablet, Rfl: 4   sertraline (ZOLOFT) 50 MG  tablet, Take 50 mg by mouth daily., Disp: , Rfl:    sevelamer carbonate (RENVELA) 800 MG tablet, Take 2 tablets (1,600 mg total) by mouth 3 (three) times daily., Disp: 360 tablet, Rfl: 4   tiZANidine (ZANAFLEX) 2 MG tablet, Take 1 tablet (2 mg total) by mouth daily as needed for muscle spasms., Disp: 30 tablet, Rfl: 0  Allergies  Allergen Reactions   Tramadol Rash    I personally reviewed active problem list, medication list, allergies, notes from last encounter, notes from last 2 encounters, lab results, imaging with the patient/caregiver today.   Review of Systems  Constitutional:  Negative for chills, fever and malaise/fatigue.  Eyes:  Negative for blurred vision and double vision.  Respiratory:  Negative for shortness of breath and wheezing.   Cardiovascular:  Negative for chest pain, palpitations and leg swelling.  Neurological:  Negative for dizziness, weakness and headaches.      Objective  Vitals:   01/23/22 1334 01/23/22 1348  BP: (!) 77/47 (!) 80/48  Pulse: 86   Temp: 98.5 F (36.9 C)   SpO2: 98%   Weight: 259 lb (117.5 kg)     Body mass index is 32.37 kg/m.  Physical Exam Vitals reviewed.  Constitutional:      General: He is awake.     Appearance: Normal appearance. He is well-developed.  HENT:     Head: Normocephalic and atraumatic.  Cardiovascular:     Rate and Rhythm: Normal rate and regular rhythm.     Heart sounds: Normal heart sounds.     Arteriovenous access: Left arteriovenous access is present. Pulmonary:     Effort: Pulmonary effort is normal.     Breath sounds: Normal breath sounds. No decreased air movement. No decreased breath sounds, wheezing, rhonchi or rales.  Musculoskeletal:     Right lower leg: No edema.     Left lower leg: No edema.  Skin:    Findings: Wound present.       Neurological:     Mental Status: [name].  Psychiatric:        Behavior: Behavior is cooperative.      Recent Results (from the past 2160 hour(s))   Bayer DCA Hb A1c Waived     Status: None   Collection  Time: 11/25/21  1:55 PM  Result Value Ref Range   HB A1C (BAYER DCA - WAIVED) 5.6 4.8 - 5.6 %    Comment:          Prediabetes: 5.7 - 6.4          Diabetes: >6.4          Glycemic control for adults with diabetes: <7.0   Lipid Panel w/o Chol/HDL Ratio     Status: Abnormal   Collection Time: 11/25/21  1:57 PM  Result Value Ref Range   Cholesterol, Total 186 100 - 199 mg/dL   Triglycerides 121 0 - 149 mg/dL   HDL 50 >39 mg/dL   VLDL Cholesterol Cal 22 5 - 40 mg/dL   LDL Chol Calc (NIH) 114 (H) 0 - 99 mg/dL  CBC with Differential     Status: Abnormal   Collection Time: 01/02/22 10:15 AM  Result Value Ref Range   WBC 6.1 4.0 - 10.5 K/uL   RBC 5.67 4.22 - 5.81 MIL/uL   Hemoglobin 16.3 13.0 - 17.0 g/dL   HCT 49.7 39.0 - 52.0 %   MCV 87.7 80.0 - 100.0 fL   MCH 28.7 26.0 - 34.0 pg   MCHC 32.8 30.0 - 36.0 g/dL   RDW 15.9 (H) 11.5 - 15.5 %   Platelets 203 150 - 400 K/uL   nRBC 0.0 0.0 - 0.2 %   Neutrophils Relative % 70 %   Neutro Abs 4.3 1.7 - 7.7 K/uL   Lymphocytes Relative 14 %   Lymphs Abs 0.9 0.7 - 4.0 K/uL   Monocytes Relative 9 %   Monocytes Absolute 0.5 0.1 - 1.0 K/uL   Eosinophils Relative 4 %   Eosinophils Absolute 0.3 0.0 - 0.5 K/uL   Basophils Relative 2 %   Basophils Absolute 0.1 0.0 - 0.1 K/uL   Immature Granulocytes 1 %   Abs Immature Granulocytes 0.04 0.00 - 0.07 K/uL    Comment: Performed at [facility], [address] [place], [place] [address]  Basic metabolic panel     Status: Abnormal   Collection Time: 01/02/22 10:15 AM  Result Value Ref Range   Sodium 135 135 - 145 mmol/L    Comment: ELECTROLYTES REPEATED TO VERIFY DAS   Potassium 4.7 3.5 - 5.1 mmol/L   Chloride 89 (L) 98 - 111 mmol/L   CO2 24 22 - 32 mmol/L   Glucose, Bld 76 70 - 99 mg/dL    Comment: Glucose reference range applies only to samples taken after fasting for at least 8 hours.   BUN 58 (H) 6 - 20 mg/dL   Creatinine, Ser  13.92 (H) 0.61 - 1.24 mg/dL   Calcium 8.2 (L) 8.9 - 10.3 mg/dL   GFR, Estimated 4 (L) >60 mL/min    Comment: (NOTE) Calculated using the CKD-EPI Creatinine Equation (2021)    Anion gap 22 (H) 5 - 15    Comment: Performed at [facility], [address] [place], [place] [address]  Lactic acid, plasma     Status: Abnormal   Collection Time: 01/02/22 11:38 AM  Result Value Ref Range   Lactic Acid, Venous 2.0 (HH) 0.5 - 1.9 mmol/L    Comment: CRITICAL RESULT CALLED TO, READ BACK BY AND VERIFIED WITH BRANDY RN @1310  01/02/22 MJU Performed at [facility], [address] [place], [place] [address]   Blood culture (routine x 2)     Status: None   Collection Time: 01/02/22 11:39 AM  Specimen: BLOOD RIGHT ARM  Result Value Ref Range   Specimen Description BLOOD RIGHT ARM    Special Requests      BOTTLES DRAWN AEROBIC AND ANAEROBIC Blood Culture adequate volume   Culture      NO GROWTH 5 DAYS Performed at [facility], [address] [place], [place] [address]    Report Status 01/07/2022 FINAL   Blood culture (routine x 2)     Status: None   Collection Time: 01/02/22  1:09 PM   Specimen: BLOOD RIGHT HAND  Result Value Ref Range   Specimen Description BLOOD RIGHT HAND    Special Requests      BOTTLES DRAWN AEROBIC AND ANAEROBIC Blood Culture adequate volume   Culture      NO GROWTH 5 DAYS Performed at [facility], [address], [place], [place] [address]    Report Status 01/07/2022 FINAL   Lactic acid, plasma     Status: None   Collection Time: 01/02/22  1:38 PM  Result Value Ref Range   Lactic Acid, Venous 1.3 0.5 - 1.9 mmol/L    Comment: Performed at [facility], [address] [place], [place] [address]  Hepatitis B surface antigen     Status: None   Collection Time: 01/02/22  3:55 PM  Result Value Ref Range   Hepatitis B Surface Ag NON REACTIVE NON REACTIVE    Comment: Performed at [facility], [place]  [address], [place], [place] [address]  Hepatitis B surface antibody,quantitative     Status: None   Collection Time: 01/02/22  3:55 PM  Result Value Ref Range   Hep B S AB Quant (Post) 326.5 Immunity>9.9 mIU/mL    Comment: (NOTE)  Status of Immunity                     Anti-HBs Level  ------------------                     -------------- Inconsistent with Immunity                   0.0 - 9.9 Consistent with Immunity                          >9.9 Performed At: [facility] [address] [place], [place] [id number] Rush Farmer MD OQ:[id number]   Aerobic Culture w Gram Stain (superficial specimen)     Status: None   Collection Time: 01/02/22  5:52 PM   Specimen: Leg  Result Value Ref Range   Specimen Description      LEG Performed at [facility], [address], [place], [place] [address]    Special Requests      NONE Performed at [facility], [place], [place] [address]    Gram Stain      NO WBC SEEN FEW GRAM NEGATIVE RODS FEW GRAM POSITIVE COCCI IN PAIRS Performed at [facility], [place] [address], [place],  [address]    Culture      ABUNDANT PROTEUS MIRABILIS FEW KLEBSIELLA OXYTOCA    Report Status 01/08/2022 FINAL    Organism ID, Bacteria PROTEUS MIRABILIS    Organism ID, Bacteria KLEBSIELLA OXYTOCA       Susceptibility   Proteus mirabilis - MIC*    AMPICILLIN <=2 SENSITIVE Sensitive     CEFAZOLIN 8 SENSITIVE Sensitive     CEFEPIME <=0.12 SENSITIVE  Sensitive     CEFTAZIDIME <=1 SENSITIVE Sensitive     CEFTRIAXONE <=0.25 SENSITIVE Sensitive     CIPROFLOXACIN <=0.25 SENSITIVE Sensitive     GENTAMICIN <=1 SENSITIVE Sensitive     IMIPENEM 8 INTERMEDIATE Intermediate     TRIMETH/SULFA <=20 SENSITIVE Sensitive     AMPICILLIN/SULBACTAM <=2 SENSITIVE Sensitive     PIP/TAZO <=4 SENSITIVE Sensitive     * ABUNDANT PROTEUS MIRABILIS  Glucose, capillary     Status: Abnormal   Collection Time: 01/02/22 10:29 PM  Result  Value Ref Range   Glucose-Capillary 118 (H) 70 - 99 mg/dL    Comment: Glucose reference range applies only to samples taken after fasting for at least 8 hours.  Basic metabolic panel     Status: Abnormal   Collection Time: 01/03/22  5:03 AM  Result Value Ref Range   Sodium 135 135 - 145 mmol/L   Potassium 3.7 3.5 - 5.1 mmol/L   Chloride 93 (L) 98 - 111 mmol/L   CO2 26 22 - 32 mmol/L   Glucose, Bld 84 70 - 99 mg/dL    Comment: Glucose reference range applies only to samples taken after fasting for at least 8 hours.   BUN 37 (H) 6 - 20 mg/dL   Creatinine, Ser 10.20 (H) 0.61 - 1.24 mg/dL   Calcium 7.9 (L) 8.9 - 10.3 mg/dL   GFR, Estimated 5 (L) >60 mL/min    Comment: (NOTE) Calculated using the CKD-EPI Creatinine Equation (2021)    Anion gap 16 (H) 5 - 15    Comment: Performed at [facility], [address] [place], [place] [address]  CBC     Status: None   Collection Time: 01/03/22  5:03 AM  Result Value Ref Range   WBC 6.3 4.0 - 10.5 K/uL   RBC 5.29 4.22 - 5.81 MIL/uL   Hemoglobin 15.2 13.0 - 17.0 g/dL   HCT 46.0 39.0 - 52.0 %   MCV 87.0 80.0 - 100.0 fL   MCH 28.7 26.0 - 34.0 pg   MCHC 33.0 30.0 - 36.0 g/dL   RDW 15.4 11.5 - 15.5 %   Platelets 191 150 - 400 K/uL   nRBC 0.0 0.0 - 0.2 %    Comment: Performed at [facility], [address] [place], [place] [address]  Glucose, capillary     Status: Abnormal   Collection Time: 01/03/22  7:55 AM  Result Value Ref Range   Glucose-Capillary 105 (H) 70 - 99 mg/dL    Comment: Glucose reference range applies only to samples taken after fasting for at least 8 hours.  Glucose, capillary     Status: Abnormal   Collection Time: 01/03/22 11:54 AM  Result Value Ref Range   Glucose-Capillary 128 (H) 70 - 99 mg/dL    Comment: Glucose reference range applies only to samples taken after fasting for at least 8 hours.  Glucose, capillary     Status: Abnormal   Collection Time: 01/03/22  4:57 PM  Result Value Ref Range    Glucose-Capillary 120 (H) 70 - 99 mg/dL    Comment: Glucose reference range applies only to samples taken after fasting for at least 8 hours.  Glucose, capillary     Status: Abnormal   Collection Time: 01/03/22  9:22 PM  Result Value Ref Range   Glucose-Capillary 124 (H) 70 - 99 mg/dL    Comment: Glucose reference range applies only to samples taken after fasting for at least 8 hours.  Basic  metabolic panel     Status: Abnormal   Collection Time: 01/04/22  4:47 AM  Result Value Ref Range   Sodium 134 (L) 135 - 145 mmol/L   Potassium 4.1 3.5 - 5.1 mmol/L   Chloride 93 (L) 98 - 111 mmol/L   CO2 25 22 - 32 mmol/L   Glucose, Bld 93 70 - 99 mg/dL    Comment: Glucose reference range applies only to samples taken after fasting for at least 8 hours.   BUN 48 (H) 6 - 20 mg/dL   Creatinine, Ser 12.31 (H) 0.61 - 1.24 mg/dL   Calcium 7.9 (L) 8.9 - 10.3 mg/dL   GFR, Estimated 4 (L) >60 mL/min    Comment: (NOTE) Calculated using the CKD-EPI Creatinine Equation (2021)    Anion gap 16 (H) 5 - 15    Comment: Performed at [facility], [address] [place], [place] [address]  CBC     Status: None   Collection Time: 01/04/22  4:47 AM  Result Value Ref Range   WBC 6.5 4.0 - 10.5 K/uL   RBC 5.02 4.22 - 5.81 MIL/uL   Hemoglobin 14.6 13.0 - 17.0 g/dL   HCT 43.9 39.0 - 52.0 %   MCV 87.5 80.0 - 100.0 fL   MCH 29.1 26.0 - 34.0 pg   MCHC 33.3 30.0 - 36.0 g/dL   RDW 15.5 11.5 - 15.5 %   Platelets 197 150 - 400 K/uL   nRBC 0.0 0.0 - 0.2 %    Comment: Performed at [facility], [address], [place], [place] [address]  Magnesium     Status: None   Collection Time: 01/04/22  4:47 AM  Result Value Ref Range   Magnesium 2.3 1.7 - 2.4 mg/dL    Comment: Performed at [facility], [address] [place], [place] [address]  Glucose, capillary     Status: Abnormal   Collection Time: 01/04/22 11:45 AM  Result Value Ref Range   Glucose-Capillary 102 (H) 70 - 99 mg/dL     Comment: Glucose reference range applies only to samples taken after fasting for at least 8 hours.  Glucose, capillary     Status: Abnormal   Collection Time: 01/04/22  4:24 PM  Result Value Ref Range   Glucose-Capillary 111 (H) 70 - 99 mg/dL    Comment: Glucose reference range applies only to samples taken after fasting for at least 8 hours.  Glucose, capillary     Status: Abnormal   Collection Time: 01/04/22  9:41 PM  Result Value Ref Range   Glucose-Capillary 111 (H) 70 - 99 mg/dL    Comment: Glucose reference range applies only to samples taken after fasting for at least 8 hours.  Glucose, capillary     Status: Abnormal   Collection Time: 01/05/22  7:45 AM  Result Value Ref Range   Glucose-Capillary 105 (H) 70 - 99 mg/dL    Comment: Glucose reference range applies only to samples taken after fasting for at least 8 hours.  CBC     Status: Abnormal   Collection Time: 01/05/22  7:56 AM  Result Value Ref Range   WBC 6.5 4.0 - 10.5 K/uL   RBC 4.89 4.22 - 5.81 MIL/uL   Hemoglobin 14.0 13.0 - 17.0 g/dL   HCT 42.5 39.0 - 52.0 %   MCV 86.9 80.0 - 100.0 fL   MCH 28.6 26.0 - 34.0 pg   MCHC 32.9 30.0 - 36.0 g/dL   RDW 15.7 (  H) 11.5 - 15.5 %   Platelets 188 150 - 400 K/uL   nRBC 0.0 0.0 - 0.2 %    Comment: Performed at [facility], [address] [place], [place] [address]  Renal function panel     Status: Abnormal   Collection Time: 01/05/22  2:30 PM  Result Value Ref Range   Sodium 132 (L) 135 - 145 mmol/L   Potassium 4.0 3.5 - 5.1 mmol/L   Chloride 92 (L) 98 - 111 mmol/L   CO2 27 22 - 32 mmol/L   Glucose, Bld 101 (H) 70 - 99 mg/dL    Comment: Glucose reference range applies only to samples taken after fasting for at least 8 hours.   BUN 42 (H) 6 - 20 mg/dL   Creatinine, Ser 10.77 (H) 0.61 - 1.24 mg/dL   Calcium 7.7 (L) 8.9 - 10.3 mg/dL   Phosphorus 7.0 (H) 2.5 - 4.6 mg/dL   Albumin 2.9 (L) 3.5 - 5.0 g/dL   GFR, Estimated 5 (L) >60 mL/min    Comment:  (NOTE) Calculated using the CKD-EPI Creatinine Equation (2021)    Anion gap 13 5 - 15    Comment: Performed at [facility], [address] [place],  [address]  Glucose, capillary     Status: Abnormal   Collection Time: 01/05/22  4:54 PM  Result Value Ref Range   Glucose-Capillary 121 (H) 70 - 99 mg/dL    Comment: Glucose reference range applies only to samples taken after fasting for at least 8 hours.  Glucose, capillary     Status: Abnormal   Collection Time: 01/05/22  9:49 PM  Result Value Ref Range   Glucose-Capillary 102 (H) 70 - 99 mg/dL    Comment: Glucose reference range applies only to samples taken after fasting for at least 8 hours.  Glucose, capillary     Status: Abnormal   Collection Time: 01/06/22  7:45 AM  Result Value Ref Range   Glucose-Capillary 117 (H) 70 - 99 mg/dL    Comment: Glucose reference range applies only to samples taken after fasting for at least 8 hours.   Comment 1 Notify RN    Comment 2 Document in Chart   Glucose, capillary     Status: Abnormal   Collection Time: 01/06/22 11:32 AM  Result Value Ref Range   Glucose-Capillary 103 (H) 70 - 99 mg/dL    Comment: Glucose reference range applies only to samples taken after fasting for at least 8 hours.   Comment 1 Notify RN    Comment 2 Document in Chart   Glucose, capillary     Status: Abnormal   Collection Time: 01/06/22  5:12 PM  Result Value Ref Range   Glucose-Capillary 133 (H) 70 - 99 mg/dL    Comment: Glucose reference range applies only to samples taken after fasting for at least 8 hours.  Glucose, capillary     Status: Abnormal   Collection Time: 01/06/22 10:31 PM  Result Value Ref Range   Glucose-Capillary 112 (H) 70 - 99 mg/dL    Comment: Glucose reference range applies only to samples taken after fasting for at least 8 hours.  Glucose, capillary     Status: None   Collection Time: 01/07/22 12:58 PM  Result Value Ref Range   Glucose-Capillary 78 70 - 99 mg/dL     Comment: Glucose reference range applies only to samples taken after fasting for at least 8 hours.  Glucose, capillary     Status:  Abnormal   Collection Time: 01/07/22  5:05 PM  Result Value Ref Range   Glucose-Capillary 112 (H) 70 - 99 mg/dL    Comment: Glucose reference range applies only to samples taken after fasting for at least 8 hours.  CBC with Differential     Status: None   Collection Time: 01/12/22  9:48 PM  Result Value Ref Range   WBC 5.7 4.0 - 10.5 K/uL   RBC 5.52 4.22 - 5.81 MIL/uL   Hemoglobin 15.8 13.0 - 17.0 g/dL   HCT 47.4 39.0 - 52.0 %   MCV 85.9 80.0 - 100.0 fL   MCH 28.6 26.0 - 34.0 pg   MCHC 33.3 30.0 - 36.0 g/dL   RDW 15.1 11.5 - 15.5 %   Platelets 235 150 - 400 K/uL   nRBC 0.0 0.0 - 0.2 %   Neutrophils Relative % 67 %   Neutro Abs 3.8 1.7 - 7.7 K/uL   Lymphocytes Relative 17 %   Lymphs Abs 1.0 0.7 - 4.0 K/uL   Monocytes Relative 10 %   Monocytes Absolute 0.6 0.1 - 1.0 K/uL   Eosinophils Relative 5 %   Eosinophils Absolute 0.3 0.0 - 0.5 K/uL   Basophils Relative 1 %   Basophils Absolute 0.1 0.0 - 0.1 K/uL   Immature Granulocytes 0 %   Abs Immature Granulocytes 0.02 0.00 - 0.07 K/uL    Comment: Performed at [facility], [address] [place], [place] [address]  Comprehensive metabolic panel     Status: Abnormal   Collection Time: 01/12/22  9:48 PM  Result Value Ref Range   Sodium 134 (L) 135 - 145 mmol/L   Potassium 5.1 3.5 - 5.1 mmol/L   Chloride 91 (L) 98 - 111 mmol/L   CO2 28 22 - 32 mmol/L   Glucose, Bld 80 70 - 99 mg/dL    Comment: Glucose reference range applies only to samples taken after fasting for at least 8 hours.   BUN 46 (H) 6 - 20 mg/dL   Creatinine, Ser 10.50 (H) 0.61 - 1.24 mg/dL   Calcium 9.0 8.9 - 10.3 mg/dL   Total Protein 8.9 (H) 6.5 - 8.1 g/dL   Albumin 3.7 3.5 - 5.0 g/dL   AST 20 15 - 41 U/L   ALT 14 0 - 44 U/L   Alkaline Phosphatase 72 38 - 126 U/L   Total Bilirubin 0.9 0.3 - 1.2 mg/dL   GFR, Estimated 5 (L)  >60 mL/min    Comment: (NOTE) Calculated using the CKD-EPI Creatinine Equation (2021)    Anion gap 15 5 - 15    Comment: Performed at [facility], [address] [place], [place] [address]  Lactic acid, plasma     Status: None   Collection Time: 01/12/22  9:48 PM  Result Value Ref Range   Lactic Acid, Venous 0.8 0.5 - 1.9 mmol/L    Comment: Performed at [facility], [address], [place], [place] [address]  Aerobic/Anaerobic Culture w Gram Stain (surgical/deep wound)     Status: None   Collection Time: 01/13/22  6:51 AM   Specimen: Abscess  Result Value Ref Range   Specimen Description      ABSCESS Performed at [facility], [address] [place], [place] [address]    Special Requests      NONE Performed at [facility], [address], [place], [place] [address]    Gram Stain      NO WBC  SEEN FEW GRAM NEGATIVE RODS FEW GRAM POSITIVE COCCI IN CHAINS RARE GRAM POSITIVE RODS    Culture      MODERATE PROTEUS MIRABILIS MODERATE KLEBSIELLA OXYTOCA MODERATE ENTEROCOCCUS CASSELIFLAVUS MODERATE BACTEROIDES THETAIOTAOMICRON BETA LACTAMASE POSITIVE Performed at [facility], [place] [address], [place], [place] [address]    Report Status 01/19/2022 FINAL    Organism ID, Bacteria PROTEUS MIRABILIS    Organism ID, Bacteria KLEBSIELLA OXYTOCA    Organism ID, Bacteria ENTEROCOCCUS CASSELIFLAVUS       Susceptibility   Enterococcus casseliflavus - MIC*    AMPICILLIN <=2 SENSITIVE Sensitive     VANCOMYCIN RESISTANT Resistant     GENTAMICIN SYNERGY SENSITIVE Sensitive     LINEZOLID 1 SENSITIVE Sensitive     * MODERATE ENTEROCOCCUS CASSELIFLAVUS   Klebsiella oxytoca - MIC*    AMPICILLIN RESISTANT Resistant     CEFAZOLIN <=4 SENSITIVE Sensitive     CEFEPIME <=0.12 SENSITIVE Sensitive     CEFTAZIDIME <=1 SENSITIVE Sensitive     CEFTRIAXONE <=0.25 SENSITIVE Sensitive     CIPROFLOXACIN <=0.25 SENSITIVE Sensitive     GENTAMICIN <=1  SENSITIVE Sensitive     IMIPENEM <=0.25 SENSITIVE Sensitive     TRIMETH/SULFA <=20 SENSITIVE Sensitive     AMPICILLIN/SULBACTAM 4 SENSITIVE Sensitive     PIP/TAZO <=4 SENSITIVE Sensitive     * MODERATE KLEBSIELLA OXYTOCA   Proteus mirabilis - MIC*    AMPICILLIN <=2 SENSITIVE Sensitive     CEFAZOLIN 8 SENSITIVE Sensitive     CEFEPIME <=0.12 SENSITIVE Sensitive     CEFTAZIDIME <=1 SENSITIVE Sensitive     CEFTRIAXONE <=0.25 SENSITIVE Sensitive     CIPROFLOXACIN <=0.25 SENSITIVE Sensitive     GENTAMICIN <=1 SENSITIVE Sensitive     IMIPENEM 2 SENSITIVE Sensitive     TRIMETH/SULFA <=20 SENSITIVE Sensitive     AMPICILLIN/SULBACTAM <=2 SENSITIVE Sensitive     PIP/TAZO <=4 SENSITIVE Sensitive     * MODERATE PROTEUS MIRABILIS  CBG monitoring, ED     Status: Abnormal   Collection Time: 01/13/22 11:57 AM  Result Value Ref Range   Glucose-Capillary 114 (H) 70 - 99 mg/dL    Comment: Glucose reference range applies only to samples taken after fasting for at least 8 hours.  Glucose, capillary     Status: None   Collection Time: 01/13/22  5:13 PM  Result Value Ref Range   Glucose-Capillary 92 70 - 99 mg/dL    Comment: Glucose reference range applies only to samples taken after fasting for at least 8 hours.  Glucose, capillary     Status: Abnormal   Collection Time: 01/13/22  9:05 PM  Result Value Ref Range   Glucose-Capillary 121 (H) 70 - 99 mg/dL    Comment: Glucose reference range applies only to samples taken after fasting for at least 8 hours.  Glucose, capillary     Status: None   Collection Time: 01/14/22  8:21 AM  Result Value Ref Range   Glucose-Capillary 90 70 - 99 mg/dL    Comment: Glucose reference range applies only to samples taken after fasting for at least 8 hours.  MRSA Next Gen by PCR, Nasal     Status: None   Collection Time: 01/14/22  8:47 AM   Specimen: Nasal Mucosa; Nasal Swab  Result Value Ref Range   MRSA by PCR Next Gen NOT DETECTED NOT DETECTED    Comment:  (NOTE) The GeneXpert MRSA Assay (FDA approved for NASAL specimens only), is one component of a comprehensive  MRSA colonization surveillance program. It is not intended to diagnose MRSA infection nor to guide or monitor treatment for MRSA infections. Test performance is not FDA approved in patients less than 56 years old. Performed at [facility], [address] [place], [place] [address]   Hepatitis B surface antigen     Status: None   Collection Time: 01/14/22  9:02 AM  Result Value Ref Range   Hepatitis B Surface Ag NON REACTIVE NON REACTIVE    Comment: Performed at [facility] [address], [place], [place] [address]  Hepatitis B surface antibody,quantitative     Status: None   Collection Time: 01/14/22  9:02 AM  Result Value Ref Range   Hep B S AB Quant (Post) 364.3 Immunity>9.9 mIU/mL    Comment: (NOTE)  Status of Immunity                     Anti-HBs Level  ------------------                     -------------- Inconsistent with Immunity                   0.0 - 9.9 Consistent with Immunity                          >9.9 Performed At: [facility] [place], [place] [id number] Rush Farmer MD KW:[id number]   CBC with Differential/Platelet     Status: None   Collection Time: 01/14/22  9:02 AM  Result Value Ref Range   WBC 6.7 4.0 - 10.5 K/uL   RBC 5.06 4.22 - 5.81 MIL/uL   Hemoglobin 14.4 13.0 - 17.0 g/dL   HCT 43.5 39.0 - 52.0 %   MCV 86.0 80.0 - 100.0 fL   MCH 28.5 26.0 - 34.0 pg   MCHC 33.1 30.0 - 36.0 g/dL   RDW 14.9 11.5 - 15.5 %   Platelets 238 150 - 400 K/uL   nRBC 0.0 0.0 - 0.2 %   Neutrophils Relative % 68 %   Neutro Abs 4.6 1.7 - 7.7 K/uL   Lymphocytes Relative 14 %   Lymphs Abs 0.9 0.7 - 4.0 K/uL   Monocytes Relative 10 %   Monocytes Absolute 0.6 0.1 - 1.0 K/uL   Eosinophils Relative 6 %   Eosinophils Absolute 0.4 0.0 - 0.5 K/uL   Basophils Relative 1 %   Basophils Absolute 0.1 0.0 - 0.1 K/uL   Immature  Granulocytes 1 %   Abs Immature Granulocytes 0.03 0.00 - 0.07 K/uL    Comment: Performed at [facility], [address] [place], [place] [address]  Renal function panel     Status: Abnormal   Collection Time: 01/14/22  9:02 AM  Result Value Ref Range   Sodium 130 (L) 135 - 145 mmol/L   Potassium 5.1 3.5 - 5.1 mmol/L   Chloride 88 (L) 98 - 111 mmol/L   CO2 26 22 - 32 mmol/L   Glucose, Bld 123 (H) 70 - 99 mg/dL    Comment: Glucose reference range applies only to samples taken after fasting for at least 8 hours.   BUN 75 (H) 6 - 20 mg/dL   Creatinine, Ser 13.07 (H) 0.61 - 1.24 mg/dL   Calcium 8.4 (L) 8.9 - 10.3 mg/dL   Phosphorus 8.7 (H) 2.5 - 4.6 mg/dL   Albumin 3.1 (L) 3.5 - 5.0 g/dL  GFR, Estimated 4 (L) >60 mL/min    Comment: (NOTE) Calculated using the CKD-EPI Creatinine Equation (2021)    Anion gap 16 (H) 5 - 15    Comment: Performed at [facility], [address] [place], [place] [address]  Basic metabolic panel     Status: Abnormal   Collection Time: 01/14/22  9:02 AM  Result Value Ref Range   Sodium 130 (L) 135 - 145 mmol/L   Potassium 5.0 3.5 - 5.1 mmol/L   Chloride 89 (L) 98 - 111 mmol/L   CO2 25 22 - 32 mmol/L   Glucose, Bld 119 (H) 70 - 99 mg/dL    Comment: Glucose reference range applies only to samples taken after fasting for at least 8 hours.   BUN 74 (H) 6 - 20 mg/dL   Creatinine, Ser 12.96 (H) 0.61 - 1.24 mg/dL   Calcium 8.4 (L) 8.9 - 10.3 mg/dL   GFR, Estimated 4 (L) >60 mL/min    Comment: (NOTE) Calculated using the CKD-EPI Creatinine Equation (2021)    Anion gap 16 (H) 5 - 15    Comment: Performed at [facility], [address] [place], [place] [address]  Glucose, capillary     Status: Abnormal   Collection Time: 01/14/22  4:29 PM  Result Value Ref Range   Glucose-Capillary 100 (H) 70 - 99 mg/dL    Comment: Glucose reference range applies only to samples taken after fasting for at least 8 hours.  Glucose, capillary      Status: Abnormal   Collection Time: 01/14/22 10:09 PM  Result Value Ref Range   Glucose-Capillary 121 (H) 70 - 99 mg/dL    Comment: Glucose reference range applies only to samples taken after fasting for at least 8 hours.   Comment 1 Notify RN   Glucose, capillary     Status: None   Collection Time: 01/15/22  7:42 AM  Result Value Ref Range   Glucose-Capillary 88 70 - 99 mg/dL    Comment: Glucose reference range applies only to samples taken after fasting for at least 8 hours.  Renal function panel     Status: Abnormal   Collection Time: 01/15/22  9:22 AM  Result Value Ref Range   Sodium 131 (L) 135 - 145 mmol/L   Potassium 4.7 3.5 - 5.1 mmol/L   Chloride 91 (L) 98 - 111 mmol/L   CO2 27 22 - 32 mmol/L   Glucose, Bld 103 (H) 70 - 99 mg/dL    Comment: Glucose reference range applies only to samples taken after fasting for at least 8 hours.   BUN 51 (H) 6 - 20 mg/dL   Creatinine, Ser 9.80 (H) 0.61 - 1.24 mg/dL   Calcium 8.0 (L) 8.9 - 10.3 mg/dL   Phosphorus 6.4 (H) 2.5 - 4.6 mg/dL   Albumin 2.8 (L) 3.5 - 5.0 g/dL   GFR, Estimated 6 (L) >60 mL/min    Comment: (NOTE) Calculated using the CKD-EPI Creatinine Equation (2021)    Anion gap 13 5 - 15    Comment: Performed at [facility], [address] [place], [place] [address]  Glucose, capillary     Status: None   Collection Time: 01/15/22 11:46 AM  Result Value Ref Range   Glucose-Capillary 88 70 - 99 mg/dL    Comment: Glucose reference range applies only to samples taken after fasting for at least 8 hours.  Type and screen     Status: None   Collection Time: 01/16/22  2:31 AM  Result Value Ref Range   ABO/RH(D) A POS    Antibody Screen NEG    Sample Expiration      01/19/2022,2359 Performed at [facility], [address] [place], [place] [address]   Glucose, capillary     Status: None   Collection Time: 01/16/22  8:36 AM  Result Value Ref Range   Glucose-Capillary 92 70 - 99 mg/dL    Comment:  Glucose reference range applies only to samples taken after fasting for at least 8 hours.   Comment 1 Notify RN    Comment 2 Document in Chart   Aerobic/Anaerobic Culture w Gram Stain (surgical/deep wound)     Status: None   Collection Time: 01/16/22 11:12 AM   Specimen: Wound; Tissue  Result Value Ref Range   Specimen Description      WOUND Performed at [facility], [address] [place], [place] [address]    Special Requests      NONE Performed at [facility], [place], [place] [address]    Gram Stain      NO WBC SEEN FEW GRAM NEGATIVE RODS FEW GRAM POSITIVE COCCI IN CLUSTERS    Culture      MODERATE PROTEUS MIRABILIS FEW KLEBSIELLA OXYTOCA SUSCEPTIBILITIES PERFORMED ON PREVIOUS CULTURE WITHIN THE LAST 5 DAYS. ABUNDANT BACTEROIDES OVATUS BETA LACTAMASE POSITIVE Performed at [facility], [place] [address] [place], [place] [address]    Report Status 01/20/2022 FINAL   Surgical pathology     Status: None   Collection Time: 01/16/22 11:20 AM  Result Value Ref Range   SURGICAL PATHOLOGY      SURGICAL PATHOLOGY CASE: [id number] PATIENT: Sophronia Simas Surgical Pathology Report     Specimen Submitted: A. Wound tissue, right leg  Clinical History: Non healing wound      DIAGNOSIS: Leg, right, "wound"; debridement: - Skin and subcutaneous tissue with ulceration and necrosis, marked.   GROSS DESCRIPTION: A. Labeled: Tissue from right leg wound Received: Fresh Collection time: 11:20 AM on 01/16/2022 Placed into formalin time: 12:34 PM on 01/16/2022 Tissue fragment(s): 2 Size: Ranges from 1.5-2.0 cm Description: Received are pink to gray, dull soft tissue fragments, 1 of which is surfaced by a 1.9 x 0.5 cm fragment of pink, smooth skin and 1 of which is surfaced by a 1.3 x 1.1 cm ellipse of pink to black skin Representative sections are submitted in 1 cassette.  CM 01/16/2022  Final Diagnosis performed by Tomasa Blase, MD.   Electronically signed 01/19/2022 11:44:12AM The electronic signature indicates that the named Attending Pathologist h as evaluated the specimen Technical component performed at [facility], [address] [place], [place] [address] Lab: [phone] Dir: Rush Farmer, MD, MMM  Professional component performed at [facility], [facility], [place], [place],  [address] Lab: [id number] Dir: Kathi Simpers, MD   Glucose, capillary     Status: None   Collection Time: 01/16/22 11:54 AM  Result Value Ref Range   Glucose-Capillary 96 70 - 99 mg/dL    Comment: Glucose reference range applies only to samples taken after fasting for at least 8 hours.  Glucose, capillary     Status: Abnormal   Collection Time: 01/16/22  9:24 PM  Result Value Ref Range   Glucose-Capillary 129 (H) 70 - 99 mg/dL    Comment: Glucose reference range applies only to samples taken after fasting for at least 8 hours.  CBC  Status: None   Collection Time: 01/17/22  5:43 AM  Result Value Ref Range   WBC 5.7 4.0 - 10.5 K/uL   RBC 4.69 4.22 - 5.81 MIL/uL   Hemoglobin 13.2 13.0 - 17.0 g/dL   HCT 41.1 39.0 - 52.0 %   MCV 87.6 80.0 - 100.0 fL   MCH 28.1 26.0 - 34.0 pg   MCHC 32.1 30.0 - 36.0 g/dL   RDW 15.2 11.5 - 15.5 %   Platelets 210 150 - 400 K/uL   nRBC 0.0 0.0 - 0.2 %    Comment: Performed at [facility], [address], [place], [place] [address]  Basic metabolic panel     Status: Abnormal   Collection Time: 01/17/22  5:43 AM  Result Value Ref Range   Sodium 132 (L) 135 - 145 mmol/L   Potassium 5.0 3.5 - 5.1 mmol/L   Chloride 94 (L) 98 - 111 mmol/L   CO2 29 22 - 32 mmol/L   Glucose, Bld 107 (H) 70 - 99 mg/dL    Comment: Glucose reference range applies only to samples taken after fasting for at least 8 hours.   BUN 41 (H) 6 - 20 mg/dL   Creatinine, Ser 9.10 (H) 0.61 - 1.24 mg/dL   Calcium 8.1 (L) 8.9 - 10.3 mg/dL   GFR, Estimated 6 (L) >60 mL/min     Comment: (NOTE) Calculated using the CKD-EPI Creatinine Equation (2021)    Anion gap 9 5 - 15    Comment: Performed at [facility], [address] [place], [place] [address]  Glucose, capillary     Status: Abnormal   Collection Time: 01/17/22  8:04 AM  Result Value Ref Range   Glucose-Capillary 112 (H) 70 - 99 mg/dL    Comment: Glucose reference range applies only to samples taken after fasting for at least 8 hours.  Glucose, capillary     Status: None   Collection Time: 01/17/22 11:45 AM  Result Value Ref Range   Glucose-Capillary 75 70 - 99 mg/dL    Comment: Glucose reference range applies only to samples taken after fasting for at least 8 hours.  Glucose, capillary     Status: None   Collection Time: 01/17/22 12:12 PM  Result Value Ref Range   Glucose-Capillary 88 70 - 99 mg/dL    Comment: Glucose reference range applies only to samples taken after fasting for at least 8 hours.  Glucose, capillary     Status: None   Collection Time: 01/17/22  4:56 PM  Result Value Ref Range   Glucose-Capillary 88 70 - 99 mg/dL    Comment: Glucose reference range applies only to samples taken after fasting for at least 8 hours.  Glucose, capillary     Status: Abnormal   Collection Time: 01/17/22  9:21 PM  Result Value Ref Range   Glucose-Capillary 106 (H) 70 - 99 mg/dL    Comment: Glucose reference range applies only to samples taken after fasting for at least 8 hours.   Comment 1 Notify RN   Glucose, capillary     Status: None   Collection Time: 01/18/22  7:40 AM  Result Value Ref Range   Glucose-Capillary 90 70 - 99 mg/dL    Comment: Glucose reference range applies only to samples taken after fasting for at least 8 hours.  Glucose, capillary     Status: Abnormal   Collection Time: 01/18/22 11:19 AM  Result Value Ref Range   Glucose-Capillary 145 (H) 70 -  99 mg/dL    Comment: Glucose reference range applies only to samples taken after fasting for at least 8 hours.  Glucose,  capillary     Status: Abnormal   Collection Time: 01/18/22  9:04 PM  Result Value Ref Range   Glucose-Capillary 110 (H) 70 - 99 mg/dL    Comment: Glucose reference range applies only to samples taken after fasting for at least 8 hours.  Glucose, capillary     Status: Abnormal   Collection Time: 01/19/22  7:24 AM  Result Value Ref Range   Glucose-Capillary 103 (H) 70 - 99 mg/dL    Comment: Glucose reference range applies only to samples taken after fasting for at least 8 hours.  Renal function panel     Status: Abnormal   Collection Time: 01/19/22  9:52 AM  Result Value Ref Range   Sodium 132 (L) 135 - 145 mmol/L   Potassium 4.8 3.5 - 5.1 mmol/L   Chloride 95 (L) 98 - 111 mmol/L   CO2 26 22 - 32 mmol/L   Glucose, Bld 125 (H) 70 - 99 mg/dL    Comment: Glucose reference range applies only to samples taken after fasting for at least 8 hours.   BUN 61 (H) 6 - 20 mg/dL   Creatinine, Ser 12.05 (H) 0.61 - 1.24 mg/dL   Calcium 7.5 (L) 8.9 - 10.3 mg/dL   Phosphorus 5.7 (H) 2.5 - 4.6 mg/dL   Albumin 2.4 (L) 3.5 - 5.0 g/dL   GFR, Estimated 4 (L) >60 mL/min    Comment: (NOTE) Calculated using the CKD-EPI Creatinine Equation (2021)    Anion gap 11 5 - 15    Comment: Performed at [facility], [address] [place], [place] [address]  CBC     Status: Abnormal   Collection Time: 01/19/22  9:52 AM  Result Value Ref Range   WBC 5.0 4.0 - 10.5 K/uL   RBC 4.01 (L) 4.22 - 5.81 MIL/uL   Hemoglobin 11.4 (L) 13.0 - 17.0 g/dL   HCT 34.6 (L) 39.0 - 52.0 %   MCV 86.3 80.0 - 100.0 fL   MCH 28.4 26.0 - 34.0 pg   MCHC 32.9 30.0 - 36.0 g/dL   RDW 15.1 11.5 - 15.5 %   Platelets 208 150 - 400 K/uL   nRBC 0.0 0.0 - 0.2 %    Comment: Performed at [facility], [address] [place], [place] [address]  Glucose, capillary     Status: None   Collection Time: 01/19/22  2:08 PM  Result Value Ref Range   Glucose-Capillary 87 70 - 99 mg/dL    Comment: Glucose reference range applies  only to samples taken after fasting for at least 8 hours.     PHQ2/9:    11/25/2021    2:34 PM 04/10/2021    9:07 AM 04/08/2020    3:56 PM 10/04/2019    1:18 PM  Depression screen PHQ 2/9  Decreased Interest 0 0 0 0  Down, Depressed, Hopeless 0 0 0 0  PHQ - 2 Score 0 0 0 0  Altered sleeping 0  0   Tired, decreased energy 0  0   Change in appetite 0  0   Feeling bad or failure about yourself  0  0   Trouble concentrating 0  0   Moving slowly or fidgety/restless 0  0   Suicidal thoughts 0  0   PHQ-9 Score 0  0   Difficult doing work/chores Not  difficult at all         Fall Risk:    12/24/2021    1:21 PM 11/25/2021    2:34 PM 04/08/2020    3:56 PM 10/04/2019    1:18 PM  [place] in the past year? 0 0 0 0  Number falls in past yr: 0 0  0  Injury with Fall? 0 0  0  Risk for fall due to : No Fall Risks No Fall Risks    Follow up Falls evaluation completed Falls evaluation completed  Falls evaluation completed      Functional Status Survey:      Assessment & Plan  Problem List Items Addressed This Visit       Other   Ulcer of right lower extremity ([place]) - Primary    Unsure of chronicity Patient was recently released from hospitalization for management of this  He has wound vac placed over wound with suction/ vacuum intact  He reports he is taking his Augmentin as directed  Reviewed going to wound care and provided address/ phone number for him to call to help coordinate apt Recommend he keep upcoming apts with Wound care and recommend follow up in about 2-3 weeks with PCP for monitoring         Return in about 3 weeks (around 02/13/2022) for DM ulcer follow up .   I, Porchia Sinkler E Teren Franckowiak, PA-C, have reviewed all documentation for this visit. The documentation on 01/26/22 for the exam, diagnosis, procedures, and orders are all accurate and complete.   Talitha Givens, MHS, PA-C [facility]

## 2022-01-23 NOTE — Patient Instructions (Addendum)
I would like you to call the [facility] on Monday to schedule an apt as they need to check your wound vac and make sure your ulcer is healing appropriately  [facility]  [place], [place], [place]  Please follow the instructions that were provided on your hospital discharge sheet

## 2022-01-26 ENCOUNTER — Telehealth: Payer: Self-pay | Admitting: Nurse Practitioner

## 2022-01-26 NOTE — Telephone Encounter (Signed)
Adam Howell w/ wound care center requesting a cb  Please assist further

## 2022-01-26 NOTE — Telephone Encounter (Signed)
Called patient and left a voicemail with Sheila's number so that he can schedule his appointment with wound care

## 2022-01-26 NOTE — Telephone Encounter (Signed)
Called and LVM asking for Jenny Reichmann to please return my call.    Please route call to the office if Mark calls back.

## 2022-01-26 NOTE — Telephone Encounter (Signed)
Caller states she received a message from christy on 11-10 regarding pt being seen today 11-13  Caller states there is no availability for today but does have an opening on 11-17  Caller states they have called pt but was unable to reach him  Please assist further

## 2022-01-26 NOTE — Assessment & Plan Note (Signed)
Unsure of chronicity Patient was recently released from hospitalization for management of this  He has wound vac placed over wound with suction/ vacuum intact  He reports he is taking his Augmentin as directed  Reviewed going to wound care and provided address/ phone number for him to call to help coordinate apt Recommend he keep upcoming apts with Wound care and recommend follow up in about 2-3 weeks with PCP for monitoring

## 2022-01-26 NOTE — Telephone Encounter (Signed)
Copied from [place] [phone]. Topic: Quick Communication - Home Health Verbal Orders >> Jan 26, 2022  8:43 AM Leilani Able wrote: Caller/Agency: [place] Number: [phone] Requesting PT/  Frequency: 1 time a wk for 7 weeks

## 2022-01-27 ENCOUNTER — Telehealth: Payer: Self-pay | Admitting: Nurse Practitioner

## 2022-01-27 MED ORDER — HYDROCODONE-ACETAMINOPHEN 10-325 MG PO TABS: 1.0000 | ORAL_TABLET | Freq: Four times a day (QID) | ORAL | 0 refills | Status: DC | PRN

## 2022-01-27 NOTE — Telephone Encounter (Signed)
Pls fu with pt re med, called back and says disconnected and still did not know about his med, fu at [phone]

## 2022-01-27 NOTE — Telephone Encounter (Signed)
Pt calling asking if he can get a refill on the Norco 10 mg or can he get something stronger.  He said he is taking two of them and its still hurting  CB#  [phone]

## 2022-01-27 NOTE — Telephone Encounter (Signed)
Called and LVM giving verbal orders per Jolene.  ?

## 2022-01-28 ENCOUNTER — Other Ambulatory Visit: Payer: Self-pay | Admitting: Nurse Practitioner

## 2022-01-28 LAB — AEROBIC CULTURE W GRAM STAIN (SUPERFICIAL SPECIMEN): Gram Stain: NONE SEEN

## 2022-01-28 NOTE — Telephone Encounter (Signed)
Patient checking status of refill request  Pt states he is normally prescribed 60 tablets  Advised pt of provider's message, if he is having increased pain other pain control options can be discussed at his 11-21 ov  Pt verbalized understanding

## 2022-01-28 NOTE — Telephone Encounter (Signed)
FYI

## 2022-01-28 NOTE — Telephone Encounter (Signed)
Requested medication (s) are due for refill today: yes  Requested medication (s) are on the active medication list: yes  Last refill:  01/01/22  Future visit scheduled: yes  Notes to clinic:  Unable to refill per protocol, cannot delegate.      Requested Prescriptions  Pending Prescriptions Disp Refills   tiZANidine (ZANAFLEX) 2 MG tablet [Pharmacy Med Name: tiZANidine HCl 2 MG Oral Tablet] 30 tablet 0    Sig: TAKE 1 TABLET BY MOUTH ONCE DAILY AS NEEDED FOR MUSCLE SPASM     Not Delegated - Cardiovascular:  Alpha-2 Agonists - tizanidine Failed - 01/28/2022 12:43 PM      Failed - This refill cannot be delegated      Passed - Valid encounter within last 6 months    Recent Outpatient Visits           5 days ago Ulcer of right lower extremity, unspecified ulcer stage ([place])   [facility] Mecum, Erin E, PA-C   4 weeks ago Skin ulcer of right lower leg, limited to breakdown of skin ([place])   [place], [place] T, NP   1 month ago Skin ulcer of right lower leg, limited to breakdown of skin ([place])   [facility] Jon Billings, NP   2 months ago Type 2 diabetes mellitus with ESRD (end-stage renal disease) ([place])   [place] Cannady, Jolene T, NP   3 months ago Allergic contact dermatitis, unspecified trigger   [place] Kathrine Haddock, NP       Future Appointments             In 6 days Cannady, Barbaraann Faster, NP [facility], [facility]

## 2022-01-29 NOTE — Telephone Encounter (Signed)
Called and left message with Freda Munro about patient

## 2022-02-01 NOTE — Patient Instructions (Signed)
Pressure Injury  A pressure injury, also called a pressure ulcer or bedsore, is an injury to skin and the tissue under the skin that is caused by pressure. It often affects people who must spend a long time in a bed or chair because of a medical condition. Pressure injuries often occur: Over bony parts of the body, such as the tailbone, shoulders, elbows, hips, heels, spine, ankles, and back of the head. Under medical devices that touch the body. These include stockings, equipment to help with breathing, tubes, and splints. Inside the mouth or nose from dentures or tubes. Pressure injuries start as red areas on the skin and can lead to pain and an open wound. What are the causes? This condition is caused by frequent or constant pressure to an area of the body. Less blood flow to the skin can make the tissue die and break down over time, causing a wound. What increases the risk? You are more likely to develop this condition if: You are in the hospital or an extended care facility. You are bedridden or in a wheelchair. You have an injury or disease that keeps you from moving well and feeling pain or pressure. You have a condition that: Makes you sleepy or less alert. Causes poor blood flow. You need to wear a medical device. You have poor control of your bladder or bowel movements (incontinence). You are not getting enough fluid or nutrients (malnutrition). Your health care provider may recommend certain types of mattresses, mattress covers, pillows, cushions, or boots to help prevent a pressure injury. These may include products filled with air, foam, gel, or sand. What are the signs or symptoms? Symptoms of this condition depend on how severe your injury is. Symptoms may include: Red or dark areas of the skin. Pain or a change in skin texture. Your skin may feel warmer, cooler, softer, or firmer. Blisters. An open wound. How is this diagnosed? This condition is diagnosed based on a  medical history and physical exam. You may also have tests, such as: Blood tests. Imaging tests. Blood flow tests. Your injury will be staged based on how severe it is. Staging is based on: How deep the tissue injury is. This includes whether muscle, bone, tendon, or dead tissue is exposed. The cause of the injury. How is this treated? This condition may be treated by: Reducing pressure on your skin. You may need to: Change your position often. Avoid positions that caused the wound or that may make the wound worse. Use certain mattresses, overlays, chair cushions, or protective boots. Move medical devices from an area of pressure, or place padding between the skin and the device. Use foams, creams, or powders to protect your skin from sweat, urine, and stool and reduce rubbing (friction) on the skin. Keeping your skin clean and dry. This may include using a skin cleanser or barrier as told by your health care provider. Cleaning your injury and getting rid of any dead tissue from the wound (debridement). Placing a protective medicine, such as a cream, or bandage (dressing) over your injury. Using medicines for pain or to prevent or treat infection. Surgery may be needed if other treatments are not working or if your injury is very deep. Follow these instructions at home: Medicines Take over-the-counter and prescription medicines only as told by your health care provider. If you were prescribed antibiotics, take or apply them as told by your health care provider. Do not stop using the antibiotic even if you start to   feel better. Eating and drinking Drink enough fluid to keep your urine pale yellow. Eat a healthy diet with lots of protein, as told by your health care provider. Do not use drugs or drink alcohol. Wound care Follow instructions from your health care provider about how to take care of your wound. Make sure you: Wash your hands with soap and water before and after you change  your dressing or apply medicine to your skin. If soap and water are not available, use hand sanitizer. Change your dressing as told by your health care provider. Check your wound every day for signs of infection. Have a caregiver do this for you if you are not able. Check for: Redness, swelling, or more pain. More fluid or blood. Warmth. Pus or a bad smell. Skin care Keep your skin clean and dry. Gently pat your skin dry. Do not rub or massage your skin. Check your skin every day for any changes in color or any new blisters or sores (ulcers). Reducing pressure Do not lie or sit in one position for a long time. Move or change position every 1-2 hours, or as told by your health care provider. Use pillows or cushions to reduce pressure. Ask your health care provider what cushions or pads you should use. General instructions Do not use any products that contain nicotine or tobacco. These products include cigarettes, chewing tobacco, and vaping devices, such as e-cigarettes. If you need help quitting, ask your health care provider. Try to be active every day. Ask your health care provider what exercises or activities are safe for you. Keep all follow-up visits. Your health care provider will check if your injury is healing. Contact a health care provider if: You have a fever or chills. You have pain that does not get better with medicine. Your skin changes color. You have new blisters or sores. You have signs of infection. Your wound does not get better after 1-2 weeks of treatment. This information is not intended to replace advice given to you by your health care provider. Make sure you discuss any questions you have with your health care provider. Document Revised: 08/26/2021 Document Reviewed: 08/01/2021 Elsevier Patient Education  2023 Elsevier Inc.  

## 2022-02-02 ENCOUNTER — Emergency Department
Admission: EM | Admit: 2022-02-02 | Discharge: 2022-02-03 | Disposition: A | Discharge status: Home / Self Care | Payer: Medicare Other | Attending: Emergency Medicine | Admitting: Emergency Medicine

## 2022-02-02 ENCOUNTER — Ambulatory Visit: Payer: Self-pay

## 2022-02-02 ENCOUNTER — Encounter: Payer: Self-pay | Admitting: Emergency Medicine

## 2022-02-02 ENCOUNTER — Other Ambulatory Visit: Payer: Self-pay

## 2022-02-02 DIAGNOSIS — G8929 Other chronic pain: Secondary | ICD-10-CM | POA: Diagnosis not present

## 2022-02-02 DIAGNOSIS — I129 Hypertensive chronic kidney disease with stage 1 through stage 4 chronic kidney disease, or unspecified chronic kidney disease: Secondary | ICD-10-CM | POA: Diagnosis not present

## 2022-02-02 DIAGNOSIS — E1122 Type 2 diabetes mellitus with diabetic chronic kidney disease: Secondary | ICD-10-CM | POA: Insufficient documentation

## 2022-02-02 DIAGNOSIS — Z760 Encounter for issue of repeat prescription: Secondary | ICD-10-CM | POA: Diagnosis not present

## 2022-02-02 DIAGNOSIS — M79661 Pain in right lower leg: Secondary | ICD-10-CM | POA: Insufficient documentation

## 2022-02-02 DIAGNOSIS — N189 Chronic kidney disease, unspecified: Secondary | ICD-10-CM | POA: Insufficient documentation

## 2022-02-02 LAB — CBG MONITORING, ED: Glucose-Capillary: 96 mg/dL (ref 70–99)

## 2022-02-02 MED ORDER — HYDROCODONE-ACETAMINOPHEN 10-325 MG PO TABS: 1.0000 | ORAL_TABLET | Freq: Four times a day (QID) | ORAL | 0 refills | Status: DC | PRN

## 2022-02-02 MED ORDER — HYDROCODONE-ACETAMINOPHEN 5-325 MG PO TABS: 1.0000 | ORAL_TABLET | ORAL | 0 refills | Status: DC | PRN

## 2022-02-02 MED ORDER — HYDROCODONE-ACETAMINOPHEN 5-325 MG PO TABS
2.0000 | ORAL_TABLET | Freq: Once | ORAL | Status: AC
  Administered 2022-02-03: 2 via ORAL
  Filled 2022-02-02: qty 2

## 2022-02-02 NOTE — Telephone Encounter (Signed)
Patient has appointment with wound care tomorrow, and with PCP

## 2022-02-02 NOTE — ED Provider Notes (Signed)
[facility] Provider Note    Event Date/Time   First MD Initiated Contact with Patient 02/02/22 2352     (approximate)  History   Chief Complaint: Leg Pain and Medication Refill  HPI  Tajae Rybicki is a 61 y.o. male with a past medical history of CKD, diabetes, hypertension, hyperlipidemia, chronic ulceration to his right lower extremity with a wound VAC presents to the emergency department for worsening pain.  According to the patient he had a debridement a little over 1 month ago to the right lower extremity chronic ulceration.  He states since that time his pain has increased.  Patient is prescribed Norco, states he ran out today and could not sleep tonight due to the pain so he came to the emergency department.  Patient has an appointment tomorrow with his physician.  Patient denies any fever or other acute complaint besides pain and being out of pain medication.  Physical Exam   Triage Vital Signs: ED Triage Vitals  Enc Vitals Group     BP 02/02/22 2301 (!) 138/97     Pulse Rate 02/02/22 2301 92     Resp 02/02/22 2301 18     Temp 02/02/22 2301 98.8 F (37.1 C)     Temp Source 02/02/22 2301 Oral     SpO2 02/02/22 2301 98 %     Weight 02/02/22 2258 256 lb (116.1 kg)     Height 02/02/22 2258 6\' 3"  (1.905 m)     Head Circumference --      Peak Flow --      Pain Score 02/02/22 2258 8     Pain Loc --      Pain Edu? --      Excl. in [place]? --     Most recent vital signs: Vitals:   02/02/22 2301  BP: (!) 138/97  Pulse: 92  Resp: 18  Temp: 98.8 F (37.1 C)  SpO2: 98%    General: Awake, no distress.  CV:  Good peripheral perfusion.  Regular rate and rhythm  Resp:  Normal effort.  Equal breath sounds bilaterally.  Abd:  No distention.  Soft, nontender.  No rebound or guarding. Other:  Wound vacuum currently applied to right lower extremity wound.  Appears to have good seal.   ED Results / Procedures / Treatments   MEDICATIONS ORDERED IN  ED: Medications - No data to display   IMPRESSION / MDM / [place] / ED COURSE  I reviewed the triage vital signs and the nursing notes.  Patient's presentation is most consistent with exacerbation of chronic illness.  Patient presents emergency department for continued pain to the right lower extremity now out of pain medication.  Patient is on chronic pain medication per PDMP review.  Patient appears to have run out of this medication appropriately (last prescribed 20 tablets of pain medication 5 days ago).  Discussed with the patient that we are unable to treat his chronic pain going forward from the emergency department however I would be willing to provide him a one-time short pain medication prescription until he can follow-up with his doctor to discuss ongoing pain management.  I discussed with the patient that if he has a pain contract he needs to call his physician prior to filling this prescription.  Patient agreeable to plan.  We will dose pain medication in the emergency department discharged with a short prescription.  Patient has no acute complaints such as fever, etc.  CBG in  the emergency department is normal.  Wound VAC appears to have good seal.  FINAL CLINICAL IMPRESSION(S) / ED DIAGNOSES   exacerbation of chronic pain Medication refill   Note:  This document was prepared using Dragon voice recognition software and may include unintentional dictation errors.   Harvest Dark, MD 02/03/22 0003

## 2022-02-02 NOTE — Telephone Encounter (Signed)
Patient has appt with Jolene tomorrow at 1:40 ,can discuss at that time

## 2022-02-02 NOTE — Telephone Encounter (Signed)
Requested medications are due for refill today.  unsure  Requested medications are on the active medications list.  historical  Last refill. 01/27/2022  Future visit scheduled.   yes  Notes to clinic.  Refill not delegated - pt call for refill.     Requested Prescriptions  Pending Prescriptions Disp Refills   HYDROcodone-acetaminophen (NORCO) 10-325 MG tablet 30 tablet     Sig: Take 1 tablet by mouth every 6 (six) hours as needed.     Not Delegated - Analgesics:  Opioid Agonist Combinations Failed - 02/02/2022  2:15 PM      Failed - This refill cannot be delegated      Failed - Urine Drug Screen completed in last 360 days      Passed - Valid encounter within last 3 months    Recent Outpatient Visits           1 week ago Ulcer of right lower extremity, unspecified ulcer stage ([place])   [place] Mecum, Erin E, PA-C   1 month ago Skin ulcer of right lower leg, limited to breakdown of skin ([place])   [place], [place] T, NP   1 month ago Skin ulcer of right lower leg, limited to breakdown of skin ([place])   [facility] Jon Billings, NP   2 months ago Type 2 diabetes mellitus with ESRD (end-stage renal disease) ([place])   [place], Jolene T, NP   3 months ago Allergic contact dermatitis, unspecified trigger   [place] Kathrine Haddock, NP       Future Appointments             Tomorrow Cannady, Barbaraann Faster, NP [facility], [facility]            Signed Prescriptions Disp Refills   HYDROcodone-acetaminophen (NORCO) 10-325 MG tablet      Sig: Take 1 tablet by mouth every 6 (six) hours as needed.     There is no refill protocol information for this order

## 2022-02-02 NOTE — Telephone Encounter (Signed)
  Chief Complaint: Pain Symptoms: Pain from Ulcer Frequency: ongoing Pertinent Negatives: Patient denies  Disposition: [] ED /[] Urgent Care (no appt availability in office) / [] Appointment(In office/virtual)/ []  [place] [facility]/ [] Home Care/ [] Refused Recommended Disposition /[] [place] [facility]/ [x]  Follow-up with PCP Additional Notes: Pt states that his foot is hurting very badly. He wants pain medication - Norco called in. PT states that he has appt at wound care tomorrow and he needs pain medication for that visit. Share with pt that refills can take 24 hours and that he may want to go to ED for pain medication.     Reason for Disposition  [1] Prescription refill request for ESSENTIAL medicine (i.e., likelihood of harm to patient if not taken) AND [2] triager unable to refill per department policy  Answer Assessment - Initial Assessment Questions 1. DRUG NAME: "What medicine do you need to have refilled?"     Norco 2. REFILLS REMAINING: "How many refills are remaining?" (Note: The label on the medicine or pill bottle will show how many refills are remaining. If there are no refills remaining, then a renewal may be needed.)     none 3. EXPIRATION DATE: "What is the expiration date?" (Note: The label states when the prescription will expire, and thus can no longer be refilled.)      4. PRESCRIBING HCP: "Who prescribed it?" Reason: If prescribed by specialist, call should be referred to that group.      5. SYMPTOMS: "Do you have any symptoms?"     Pain 6. PREGNANCY: "Is there any chance that you are pregnant?" "When was your last menstrual period?"  Protocols used: Medication Refill and Renewal Call-A-AH

## 2022-02-02 NOTE — Discharge Instructions (Signed)
As we discussed please follow-up with your pain management physician prior to filling the prescription to ensure that they are aware and okay with this.  Also as we discussed we cannot manage her chronic pain through the emergency department and we would encourage you to follow-up with your doctor for worsening pain.  Please return to the emergency department for any acute exacerbation of pain, fever or any other symptom concerning to yourself.

## 2022-02-02 NOTE — ED Triage Notes (Signed)
Pt presents via EMS with complaints of leg pain - pt has a diabetic wound, wound vac in place and is out of his medications. He states he ran out on Saturday and has to take two at a time to help with pain. Pt requesting med refill; per EMS the bottle stated no refills. Denies fevers, SOB, N/VD, falls.    CBG -96

## 2022-02-03 ENCOUNTER — Ambulatory Visit (INDEPENDENT_AMBULATORY_CARE_PROVIDER_SITE_OTHER): Payer: Medicare Other | Admitting: Nurse Practitioner

## 2022-02-03 ENCOUNTER — Encounter: Payer: Self-pay | Admitting: Nurse Practitioner

## 2022-02-03 ENCOUNTER — Ambulatory Visit: Payer: Medicare Other | Admitting: Nurse Practitioner

## 2022-02-03 ENCOUNTER — Encounter: Payer: Medicare Other | Attending: Physician Assistant | Admitting: Physician Assistant

## 2022-02-03 VITALS — BP 128/86 | HR 85 | Temp 97.7°F | Ht 75.0 in | Wt 258.0 lb

## 2022-02-03 DIAGNOSIS — L97919 Non-pressure chronic ulcer of unspecified part of right lower leg with unspecified severity: Secondary | ICD-10-CM | POA: Diagnosis not present

## 2022-02-03 DIAGNOSIS — E1122 Type 2 diabetes mellitus with diabetic chronic kidney disease: Secondary | ICD-10-CM | POA: Insufficient documentation

## 2022-02-03 DIAGNOSIS — L97812 Non-pressure chronic ulcer of other part of right lower leg with fat layer exposed: Secondary | ICD-10-CM | POA: Diagnosis not present

## 2022-02-03 DIAGNOSIS — E11622 Type 2 diabetes mellitus with other skin ulcer: Secondary | ICD-10-CM | POA: Insufficient documentation

## 2022-02-03 DIAGNOSIS — Z992 Dependence on renal dialysis: Secondary | ICD-10-CM | POA: Diagnosis not present

## 2022-02-03 DIAGNOSIS — E11621 Type 2 diabetes mellitus with foot ulcer: Secondary | ICD-10-CM | POA: Diagnosis not present

## 2022-02-03 DIAGNOSIS — E1140 Type 2 diabetes mellitus with diabetic neuropathy, unspecified: Secondary | ICD-10-CM | POA: Insufficient documentation

## 2022-02-03 DIAGNOSIS — I132 Hypertensive heart and chronic kidney disease with heart failure and with stage 5 chronic kidney disease, or end stage renal disease: Secondary | ICD-10-CM | POA: Insufficient documentation

## 2022-02-03 DIAGNOSIS — Z7901 Long term (current) use of anticoagulants: Secondary | ICD-10-CM | POA: Diagnosis not present

## 2022-02-03 DIAGNOSIS — I5042 Chronic combined systolic (congestive) and diastolic (congestive) heart failure: Secondary | ICD-10-CM | POA: Insufficient documentation

## 2022-02-03 DIAGNOSIS — G894 Chronic pain syndrome: Secondary | ICD-10-CM

## 2022-02-03 DIAGNOSIS — N186 End stage renal disease: Secondary | ICD-10-CM | POA: Insufficient documentation

## 2022-02-03 DIAGNOSIS — I480 Paroxysmal atrial fibrillation: Secondary | ICD-10-CM | POA: Diagnosis not present

## 2022-02-03 DIAGNOSIS — M79661 Pain in right lower leg: Secondary | ICD-10-CM | POA: Diagnosis not present

## 2022-02-03 MED ORDER — HYDROCODONE-ACETAMINOPHEN 10-325 MG PO TABS: 1.0000 | ORAL_TABLET | ORAL | 0 refills | Status: AC | PRN

## 2022-02-03 NOTE — Progress Notes (Signed)
BP 128/86 (BP Location: Right Arm, Patient Position: Sitting, Howell Size: Large)   Pulse 85   Temp 97.7 F (36.5 C) (Oral)   Ht 6\' 3"  (1.905 m)   Wt 258 lb (117 kg)   SpO2 93%   BMI 32.25 kg/m    Subjective:    [name], male    DOB: 1960/11/07, 61 y.o.   MRN: [id number]  HPI: Adam Howell is a 61 y.o. male  Chief Complaint  Patient presents with   Extremity Pain    Right leg pain from wound. Seen wound care today, they took the wound vac off and debrided the wound    SKIN WOUND Ongoing wound to right shin, saw wound care and they took off wound vac as they reported it was doing more damage then good, they have wrapped and dressed leg.  He reports the wound vac was causing pain, feels better with it off.  Has history of wounds ongoing, last to left lower leg in February 2023 -- they did debridement for this one.  Did have full vascular work-up on admission recently to hospital and this was reassuring with good findings.  Currently he reports wound is to the bone, but no infection to bone.  Will be seeing wound care weekly.  Currently taking Norco for pain related to wound.  Had refill sent on 01/28/22 PDMP.  Continues to need and is aware not for long term use, if long term use needed will obtain UDS and pain contract.     Was to see [facility] in November 2021 for similar issue. Duration: 12/24/21 Location:  right shin Painful: yes, improved with wound vac off Itching: none Onset: sudden Context: not changing Associated signs and symptoms: none History of skin cancer: no History of precancerous skin lesions: no Family history of skin cancer: no   Relevant past medical, surgical, family and social history reviewed and updated as indicated. Interim medical history since our last visit reviewed. Allergies and medications reviewed and updated.  Review of Systems  Constitutional:  Negative for activity change, diaphoresis, fatigue and fever.   Respiratory:  Negative for cough, chest tightness, shortness of breath and wheezing.   Cardiovascular:  Negative for chest pain, palpitations and leg swelling.  Gastrointestinal: Negative.   Endocrine: Negative for polydipsia, polyphagia and polyuria.  Skin:  Positive for wound.  Neurological: Negative.   Psychiatric/Behavioral: Negative.      Per HPI unless specifically indicated above     Objective:    BP 128/86 (BP Location: Right Arm, Patient Position: Sitting, [place] Size: Large)   Pulse 85   Temp 97.7 F (36.5 C) (Oral)   Ht 6\' 3"  (1.905 m)   Wt 258 lb (117 kg)   SpO2 93%   BMI 32.25 kg/m   Wt Readings from Last 3 Encounters:  02/03/22 258 lb (117 kg)  02/02/22 256 lb (116.1 kg)  01/23/22 259 lb (117.5 kg)    Physical Exam Vitals and nursing note reviewed.  Constitutional:      General: He is awake. He is not in acute distress.    Appearance: He is well-developed and well-groomed. He is obese. He is not ill-appearing or toxic-appearing.  HENT:     Head: Normocephalic and atraumatic.     Right Ear: Hearing normal. No drainage.     Left Ear: Hearing normal. No drainage.  Eyes:     General: Lids are normal.        Right eye:  No discharge.        Left eye: No discharge.     Conjunctiva/sclera: Conjunctivae normal.     Pupils: Pupils are equal, round, and reactive to light.  Neck:     Thyroid: No thyromegaly.     Vascular: No carotid bruit.     Trachea: Trachea normal.  Cardiovascular:     Rate and Rhythm: Normal rate and regular rhythm.     Pulses:          Dorsalis pedis pulses are 1+ on the right side and 1+ on the left side.       Posterior tibial pulses are 1+ on the right side and 1+ on the left side.     Heart sounds: Normal heart sounds, S1 normal and S2 normal. No murmur heard.    No gallop.     Arteriovenous access: Left arteriovenous access is present.    Comments: Decreased hair pattern bilateral legs. Pulmonary:     Effort: Pulmonary effort is  normal. No accessory muscle usage or respiratory distress.     Breath sounds: Normal breath sounds.  Abdominal:     General: Bowel sounds are normal. There is no distension.     Palpations: Abdomen is soft.     Tenderness: There is no abdominal tenderness.  Musculoskeletal:        General: Normal range of motion.     Cervical back: Normal range of motion and neck supple.     Right lower leg: No edema.     Left lower leg: No edema.  Skin:    General: Skin is warm and dry.     Capillary Refill: Capillary refill takes less than 2 seconds.     Findings: No rash.     Comments: Wound right shin with dressing in place.  Bilateral lower extremities with xerosis.    Neurological:     Mental Status: [name] and oriented to person, place, and time.     Deep Tendon Reflexes: Reflexes are normal and symmetric.  Psychiatric:        Attention and Perception: Attention normal.        Mood and Affect: Mood normal.        Speech: Speech normal.        Behavior: Behavior normal. Behavior is cooperative.        Thought Content: Thought content normal.        Judgment: Judgment normal.    Results for orders placed or performed during the hospital encounter of 02/02/22  POC CBG, ED  Result Value Ref Range   Glucose-Capillary 96 70 - 99 mg/dL      Assessment & Plan:   Problem List Items Addressed This Visit       Endocrine   Diabetic ulcer of right lower leg ([place]) - Primary    Chronic, followed by wound management at this time.  Continue this collaboration and review notes.  Appreciate their input on this more complex case.      Relevant Medications   HYDROcodone-acetaminophen (NORCO) 10-325 MG tablet   Other Relevant Orders   Basic metabolic panel   CBC with Differential/Platelet     Other   Chronic pain syndrome    Ongoing, reports taking Norco only after dialysis due to discomfort + also taking for wound at this time.  Controlled substance contract up to date, unable to void due  to dialysis, will consider pain management referral if frequent use of Norco.  Refill as  needed and will have more frequent visits if needs increased refills.  Will increase dosing to every 4 hours for short period due to ulcer and pain.      Relevant Medications   HYDROcodone-acetaminophen (NORCO) 10-325 MG tablet     Follow up plan: Return in about 6 weeks (around 03/17/2022) for T2DM, HTN/HLD, ESRD, WOUND, INSOMNIA.

## 2022-02-03 NOTE — Assessment & Plan Note (Signed)
Chronic, followed by wound management at this time.  Continue this collaboration and review notes.  Appreciate their input on this more complex case.

## 2022-02-03 NOTE — Assessment & Plan Note (Addendum)
Ongoing, reports taking Norco only after dialysis due to discomfort + also taking for wound at this time.  Controlled substance contract up to date, unable to void due to dialysis, will consider pain management referral if frequent use of Norco.  Refill as needed and will have more frequent visits if needs increased refills.  Will increase dosing to every 4 hours for short period due to ulcer and pain.

## 2022-02-04 LAB — CBC WITH DIFFERENTIAL/PLATELET
Basophils Absolute: 0.1 10*3/uL (ref 0.0–0.2)
Basos: 2 %
EOS (ABSOLUTE): 0.4 10*3/uL (ref 0.0–0.4)
Eos: 10 %
Hematocrit: 44 % (ref 37.5–51.0)
Hemoglobin: 14.4 g/dL (ref 13.0–17.7)
Immature Grans (Abs): 0 10*3/uL (ref 0.0–0.1)
Immature Granulocytes: 0 %
Lymphocytes Absolute: 0.8 10*3/uL (ref 0.7–3.1)
Lymphs: 18 %
MCH: 28.4 pg (ref 26.6–33.0)
MCHC: 32.7 g/dL (ref 31.5–35.7)
MCV: 87 fL (ref 79–97)
Monocytes Absolute: 0.4 10*3/uL (ref 0.1–0.9)
Monocytes: 8 %
Neutrophils Absolute: 2.8 10*3/uL (ref 1.4–7.0)
Neutrophils: 62 %
Platelets: 200 10*3/uL (ref 150–450)
RBC: 5.07 x10E6/uL (ref 4.14–5.80)
RDW: 14.8 % (ref 11.6–15.4)
WBC: 4.4 10*3/uL (ref 3.4–10.8)

## 2022-02-04 LAB — BASIC METABOLIC PANEL
BUN/Creatinine Ratio: 4 — ABNORMAL LOW (ref 10–24)
BUN: 35 mg/dL — ABNORMAL HIGH (ref 8–27)
CO2: 23 mmol/L (ref 20–29)
Calcium: 8.1 mg/dL — ABNORMAL LOW (ref 8.6–10.2)
Chloride: 89 mmol/L — ABNORMAL LOW (ref 96–106)
Creatinine, Ser: 9.51 mg/dL — ABNORMAL HIGH (ref 0.76–1.27)
Glucose: 80 mg/dL (ref 70–99)
Potassium: 5 mmol/L (ref 3.5–5.2)
Sodium: 136 mmol/L (ref 134–144)
eGFR: 6 mL/min/{1.73_m2} — ABNORMAL LOW (ref >59)

## 2022-02-04 NOTE — Progress Notes (Signed)
Good afternoon, please let Adam Howell know his labs have returned.  Kidney function does show some improvement from previous labs which is good news, although still chronic kidney disease.  CBC shows no infection or anemia -- good news.  Continue follow-up with wound care as scheduled to ensure ongoing healing of wound.  Any questions? Keep being awesome!!  Thank you for allowing me to participate in your care.  I appreciate you. Kindest regards, Dorothy Landgrebe

## 2022-02-05 NOTE — Progress Notes (Signed)
GERADO, NABERS ([id number]) [id number] Page 1 of 10 Visit Report for 02/03/2022 Allergy List Details Patient Name: Date of Service: Adam Howell, Adam Howell 02/03/2022 11:15 A M Medical Record Number: [id number] Patient Account Number: [id number] Date of Birth/Sex: Treating RN: 1960-05-23 (61 y.o. [name]: [name] Other Clinician: Referring [name]: Treating [name]/Extender: [name]: 0 Allergies Active Allergies tramadol Allergy Notes Electronic Signature(s) Signed: 02/04/2022 5:38:08 PM By: Gretta Cool, BSN, RN, CWS, Kim RN, BSN Entered By: Gretta Cool, BSN, RN, CWS, Kim on 02/03/2022 11:21:57 -------------------------------------------------------------------------------- Arrival Information Details Patient Name: Date of Service: Adam Howell 02/03/2022 11:15 A M Medical Record Number: [id number] Patient Account Number: [id number] Date of Birth/Sex: Treating RN: 1960-10-21 (61 y.o. [name]: [name] Other Clinician: Referring [name]: Treating [name]/Extender: [name]: 0 Visit Information Patient Arrived: Ambulatory Arrival Time: 11:01 Accompanied By: self Transfer Assistance: None Patient Identification Verified: Yes Secondary Verification Process Completed: Yes Patient Has Alerts: Yes Patient Alerts: NAME ALERT History Since Last Visit Has Dressing in Place as Prescribed: Yes Pain Present Now: No Electronic Signature(s) Signed: 02/04/2022 5:38:08 PM By: Gretta Cool, BSN, RN, CWS, Kim RN, BSN Entered By: Gretta Cool, BSN, RN, CWS, Kim on 02/03/2022 11:09:02 LOPEZ, DENTINGER ([id number]) [id number] Page 2 of 10 -------------------------------------------------------------------------------- Clinic Level of Care Assessment Details Patient Name: Date of Service: Adam Howell, Adam Howell 02/03/2022 11:15 A M Medical Record Number: [id number] Patient Account Number: [id number] Date of Birth/Sex: Treating RN: Oct 03, 1960 (61 y.o. [name]: [name] Other Clinician: Referring [name]: Treating [name]: [name]: 0 Clinic Level of Care Assessment Items TOOL 1 Quantity Score []  - 0 Use when EandM and Procedure is performed on INITIAL visit ASSESSMENTS - Nursing Assessment / Reassessment X- 1 20 General Physical Exam (combine w/ comprehensive assessment (listed just below) when performed on new pt. evals) X- 1 25 Comprehensive Assessment (HX, ROS, Risk Assessments, Wounds Hx, etc.) ASSESSMENTS - Wound and Skin Assessment / Reassessment []  - 0 Dermatologic / Skin Assessment (not related to wound area) ASSESSMENTS - Ostomy and/or Continence Assessment and Care []  - 0 Incontinence Assessment and Management []  - 0 Ostomy Care Assessment and Management (repouching, etc.) PROCESS - Coordination of Care X - Simple Patient / Family Education for ongoing care 1 15 []  - 0 Complex (extensive) Patient / Family Education for ongoing care X- 1 10 Staff obtains Programmer, systems, Records, T Results / Process Orders est []  - 0 Staff telephones HHA, Nursing Homes / Clarify orders / etc []  - 0 Routine Transfer to another Facility (non-emergent condition) []  - 0 Routine Hospital Admission (non-emergent condition) X- 1 15 New Admissions / Biomedical engineer / Ordering NPWT Apligraf, etc. , []  - 0 Emergency Hospital Admission (emergent condition) PROCESS - Special Needs []  - 0 Pediatric / Minor Patient Management []  - 0 Isolation Patient Management []  - 0 Hearing / Language / Visual special needs []  - 0 Assessment of Community assistance (transportation, D/C planning, etc.) []  - 0 Additional assistance / Altered mentation []  - 0 Support Surface(s) Assessment (bed, cushion, seat,  etc.) INTERVENTIONS - Miscellaneous []  - 0 External ear exam []  - 0 Patient Transfer (multiple staff / Civil Service fast streamer / Similar devices) []  - 0 Simple Staple / Suture removal (25 or less) []  - 0 Complex Staple / Suture removal (26 or more) []  - 0 Hypo/Hyperglycemic Management (do not check  if billed separately) TYMERE, DEPUY ([id number]) [id number] Page 3 of 10 []  - 0 Ankle / Brachial Index (ABI) - do not check if billed separately Has the patient been seen at the hospital within the last three years: Yes Total Score: 85 Level Of Care: New/Established - Level 3 Electronic Signature(s) Signed: 02/04/2022 5:38:08 PM By: Gretta Cool, BSN, RN, CWS, Kim RN, BSN Entered By: Gretta Cool, BSN, RN, CWS, Kim on 02/03/2022 13:44:04 -------------------------------------------------------------------------------- Compression Therapy Details Patient Name: Date of Service: Adam Howell 02/03/2022 11:15 A M Medical Record Number: [id number] Patient Account Number: [id number] Date of Birth/Sex: Treating RN: 29-May-1960 (61 y.o. [name] Primary Care Shyane Fossum: [name] Other Clinician: Referring Fed Ceci: Treating Irish Breisch/Extender: [name]: 0 Compression Therapy Performed for Wound Assessment: Wound #2 Right,Midline Lower Leg Performed By: Clinician [name], RN Compression Type: Three Layer Post Procedure Diagnosis Same as Pre-procedure Notes Patient has tolerated wraps in the past. Electronic Signature(s) Signed: 02/03/2022 1:43:38 PM By: Gretta Cool, BSN, RN, CWS, Kim RN, BSN Entered By: Gretta Cool, BSN, RN, CWS, Kim on 02/03/2022 13:43:38 -------------------------------------------------------------------------------- Encounter Discharge Information Details Patient Name: Date of Service: Adam Howell 02/03/2022 11:15 A M Medical Record Number: [id number] Patient Account Number: [id number] Date of Birth/Sex:  Treating RN: 11/25/1960 (61 y.o. [name] Primary Care Kambria Grima: [name] Other Clinician: Referring Gearl Kimbrough: Treating Karl Erway/Extender: [name]: 0 Encounter Discharge Information Items Discharge Condition: Stable Ambulatory Status: Ambulatory Discharge Destination: Home Transportation: Private Auto Schedule Follow-up Appointment: ACIE, CUSTIS ([id number]) [id number] Page 4 of 10 Clinical Summary of Care: Electronic Signature(s) Signed: 02/03/2022 1:45:34 PM By: Gretta Cool, BSN, RN, CWS, Kim RN, BSN Entered By: Gretta Cool, BSN, RN, CWS, Kim on 02/03/2022 13:45:34 -------------------------------------------------------------------------------- Lower Extremity Assessment Details Patient Name: Date of Service: Adam Howell, Adam Howell 02/03/2022 11:15 A M Medical Record Number: [id number] Patient Account Number: [id number] Date of Birth/Sex: Treating RN: 1960-07-15 (61 y.o. [name] Primary Care Angy Swearengin: [name] Other Clinician: Referring Jaeshaun Riva: Treating Cade Dashner/Extender: [name]: 0 Edema Assessment Assessed: [Left: No] [Right: No] [Left: Edema] [Right: :] Calf Left: Right: Point of Measurement: 37 cm From Medial Instep 40 cm Ankle Left: Right: Point of Measurement: 13 cm From Medial Instep 29 cm Vascular Assessment Pulses: Dorsalis Pedis Palpable: [Right:Yes] Doppler Audible: [Right:Yes] Posterior Tibial Palpable: [Right:Yes Yes] Electronic Signature(s) Signed: 02/04/2022 5:38:08 PM By: Gretta Cool, BSN, RN, CWS, Kim RN, BSN Entered By: Gretta Cool, BSN, RN, CWS, Kim on 02/03/2022 11:21:52 -------------------------------------------------------------------------------- Multi Wound Chart Details Patient Name: Date of Service: Adam Howell 02/03/2022 11:15 A Collier Flowers ([id number]) [id number] Page 5 of  10 Medical Record Number: [id number] Patient Account Number: [id number] Date of Birth/Sex: Treating RN: 1960/03/20 (61 y.o. [name] Primary Care Avry Roedl: [name] Other Clinician: Referring Ervin Rothbauer: Treating Jahmad Petrich/Extender: [name]: 0 Vital Signs Height(in): Pulse(bpm): 101 Weight(lbs): Blood Pressure(mmHg): 149/93 Body Mass Index(BMI): Temperature(F): 97.7 Respiratory Rate(breaths/min): 16 [2:Photos:] [N/A:N/A] Right, Midline Lower Leg N/A N/A Wound Location: Gradually Appeared N/A N/A Wounding Event: Diabetic Wound/Ulcer of the Lower N/A N/A Primary Etiology: Extremity Congestive Heart Failure, N/A N/A Comorbid History: Hypertension, Type II Diabetes, End Stage Renal Disease, Neuropathy 11/03/2021 N/A N/A Date Acquired: 0 N/A N/A Weeks of Treatment: Open N/A N/A Wound Status: No N/A N/A Wound Recurrence: 10x8.5x0.2 N/A N/A Measurements L x W x D (cm) 66.759 N/A N/A A (cm) : rea 13.352 N/A  N/A Volume (cm) : Grade 1 N/A N/A Classification: Medium N/A N/A Exudate A mount: Sanguinous N/A N/A Exudate Type: red N/A N/A Exudate Color: Flat and Intact N/A N/A Wound Margin: Small (1-33%) N/A N/A Granulation A mount: Large (67-100%) N/A N/A Necrotic A mount: Fat Layer (Subcutaneous Tissue): Yes N/A N/A Exposed Structures: Fascia: No Tendon: No Muscle: No Joint: No Bone: No Small (1-33%) N/A N/A Epithelialization: Treatment Notes Electronic Signature(s) Signed: 02/04/2022 5:38:08 PM By: Gretta Cool, BSN, RN, CWS, Kim RN, BSN Entered By: Gretta Cool, BSN, RN, CWS, Kim on 02/03/2022 11:50:34 -------------------------------------------------------------------------------- Multi-Disciplinary Care Plan Details Patient Name: Date of Service: Adam Howell. 02/03/2022 11:15 A M Medical Record Number: [id number] Patient Account Number: [id number] Adam Howell, Adam Howell ([id number])  [id number] Page 6 of 10 Date of Birth/Sex: Treating RN: May 02, 1960 (61 y.o. [name] Primary Care Rudolph Daoust: [name] Other Clinician: Referring Bernetta Sutley: Treating Ayodeji Keimig/Extender: [name]: 0 Active Inactive Necrotic Tissue Nursing Diagnoses: Impaired tissue integrity related to necrotic/devitalized tissue Knowledge deficit related to management of necrotic/devitalized tissue Goals: Necrotic/devitalized tissue will be minimized in the wound bed Date Initiated: 02/03/2022 Target Resolution Date: 02/03/2022 Goal Status: Active Patient/caregiver will verbalize understanding of reason and process for debridement of necrotic tissue Date Initiated: 02/03/2022 Target Resolution Date: 02/03/2022 Goal Status: Active Interventions: Assess patient pain level pre-, during and post procedure and prior to discharge Provide education on necrotic tissue and debridement process Treatment Activities: Apply topical anesthetic as ordered : 02/03/2022 Notes: Orientation to the Wound Care Program Nursing Diagnoses: Knowledge deficit related to the wound healing center program Goals: Patient/caregiver will verbalize understanding of the [place] Date Initiated: 02/03/2022 Target Resolution Date: 02/03/2022 Goal Status: Active Interventions: Provide education on orientation to the wound center Notes: Soft Tissue Infection Nursing Diagnoses: Impaired tissue integrity Knowledge deficit related to disease process and management Knowledge deficit related to home infection control: handwashing, handling of soiled dressings, supply storage Potential for infection: soft tissue Goals: Patient will remain free of wound infection Date Initiated: 02/03/2022 Target Resolution Date: 02/03/2022 Goal Status: Active Patient/caregiver will verbalize understanding of or measures to prevent infection and  contamination in the home setting Date Initiated: 02/03/2022 Target Resolution Date: 02/03/2022 Goal Status: Active Signs and symptoms of infection will be recognized early to allow for prompt treatment Date Initiated: 02/03/2022 Target Resolution Date: 02/03/2022 Goal Status: Active Interventions: Assess signs and symptoms of infection every visit Provide education on infection Notes: Venous Leg Ulcer Nursing Diagnoses: Actual venous Insuffiency (use after diagnosis is confirmed) Knowledge deficit related to disease process and management Adam Howell, Adam Howell ([id number]) [id number] Page 7 of 10 Potential for venous Insuffiency (use before diagnosis confirmed) Goals: Patient will maintain optimal edema control Date Initiated: 02/03/2022 Target Resolution Date: 02/03/2022 Goal Status: Active Interventions: Assess peripheral edema status every visit. Compression as ordered Notes: Wound/Skin Impairment Nursing Diagnoses: Impaired tissue integrity Knowledge deficit related to smoking impact on wound healing Knowledge deficit related to ulceration/compromised skin integrity Goals: Patient/caregiver will verbalize understanding of skin care regimen Date Initiated: 02/03/2022 Target Resolution Date: 02/03/2022 Goal Status: Active Ulcer/skin breakdown will have a volume reduction of 30% by week 4 Date Initiated: 02/03/2022 Target Resolution Date: 02/24/2022 Goal Status: Active Interventions: Assess ulceration(s) every visit Treatment Activities: Referred to DME Shereece Wellborn for dressing supplies : 02/03/2022 Skin care regimen initiated : 02/03/2022 Topical wound management initiated : 02/03/2022 Notes: Electronic Signature(s) Signed: 02/04/2022 5:38:08 PM By: Gretta Cool, BSN, RN, CWS, Kim RN, BSN Entered By: Gretta Cool,  BSN, RN, CWS, Kim on 02/03/2022 11:50:23 -------------------------------------------------------------------------------- Pain Assessment  Details Patient Name: Date of Service: Adam Howell, Adam Howell 02/03/2022 11:15 A M Medical Record Number: [id number] Patient Account Number: [id number] Date of Birth/Sex: Treating RN: 1960/07/08 (61 y.o. [name] Primary Care Yousuf Ager: [name] Other Clinician: Referring Mimie Goering: Treating Zubayr Bednarczyk/Extender: [name]: 0 Active Problems Location of Pain Severity and Description of Pain Patient Has Paino Yes Site Locations Pain Location: Adam Howell, Adam Howell ([id number]) [id number] Page 8 of 10 Pain Location: Pain in Ulcers With Dressing Change: Yes Rate the pain. Current Pain Level: 10 Pain Management and Medication Current Pain Management: Electronic Signature(s) Signed: 02/04/2022 5:38:08 PM By: Gretta Cool, BSN, RN, CWS, Kim RN, BSN Entered By: Gretta Cool, BSN, RN, CWS, Kim on 02/03/2022 11:09:22 -------------------------------------------------------------------------------- Patient/Caregiver Education Details Patient Name: Date of Service: Adam Howell 11/21/2023andnbsp11:15 A M Medical Record Number: [id number] Patient Account Number: [id number] Date of Birth/Gender: Treating RN: September 20, 1960 (61 y.o. [name] Primary Care Physician: [name] Other Clinician: Referring Physician: Treating Physician/Extender: [name]: 0 Education Assessment Education Provided To: Patient Education Topics Provided Venous: Handouts: Controlling Swelling with Multilayered Compression Wraps Methods: Demonstration Responses: State content correctly [place]: o Handouts: Welcome T [facility] o Methods: Demonstration, Explain/Verbal Responses: State content correctly Wound Debridement: Electronic Signature(s) Signed: 02/04/2022 5:38:08 PM By: Gretta Cool, BSN, RN, CWS, Kim RN, BSN Entered By: Gretta Cool, BSN, RN, CWS, Kim on 02/03/2022  13:45:02 Adam Howell, Adam Howell ([id number]) [id number] Page 9 of 10 -------------------------------------------------------------------------------- Wound Assessment Details Patient Name: Date of Service: Adam Howell, Adam Howell 02/03/2022 11:15 A M Medical Record Number: [id number] Patient Account Number: [id number] Date of Birth/Sex: Treating RN: 12-20-1960 (61 y.o. [name] Primary Care Amsi Grimley: [name] Other Clinician: Referring Dacoda Spallone: Treating Antwanette Wesche/Extender: [name]: 0 Wound Status Wound Number: 2 Primary Diabetic Wound/Ulcer of the Lower Extremity Etiology: Wound Location: Right, Midline Lower Leg Wound Open Wounding Event: Gradually Appeared Status: Date Acquired: 11/03/2021 Comorbid Congestive Heart Failure, Hypertension, Type II Diabetes, End Weeks Of Treatment: 0 History: Stage Renal Disease, Neuropathy Clustered Wound: No Photos Wound Measurements Length: (cm) 10 Width: (cm) 8.5 Depth: (cm) 0.2 Area: (cm) 66.759 Volume: (cm) 13.352 % Reduction in Area: % Reduction in Volume: Epithelialization: Small (1-33%) Tunneling: No Undermining: No Wound Description Classification: Grade 1 Wound Margin: Flat and Intact Exudate Amount: Medium Exudate Type: Sanguinous Exudate Color: red Foul Odor After Cleansing: No Slough/Fibrino Yes Wound Bed Granulation Amount: Small (1-33%) Exposed Structure Necrotic Amount: Large (67-100%) Fascia Exposed: No Fat Layer (Subcutaneous Tissue) Exposed: Yes Tendon Exposed: No Muscle Exposed: No Joint Exposed: No Bone Exposed: No Treatment Notes Wound #2 (Lower Leg) Wound Laterality: Right, Midline Cleanser Peri-Wound Care AandD Adam Howell, Adam Howell ([id number]) [id number] Page 10 of 10 Discharge Instruction: Apply AandD Ointment as directed Topical Primary Dressing Silvercel 4 1/4x 4 1/4 (in/in) Discharge  Instruction: Apply Silvercel 4 1/4x 4 1/4 (in/in) as instructed Secondary Dressing Zetuvit Plus 4x8 (in/in) Secured With Compression Wrap 3-LAYER WRAP - Profore Lite LF 3 Multilayer Compression Bandaging System Discharge Instruction: Apply 3 multi-layer wrap as prescribed. Compression Stockings Environmental education officer) Signed: 02/04/2022 5:38:08 PM By: Gretta Cool, BSN, RN, CWS, Kim RN, BSN Entered By: Gretta Cool, BSN, RN, CWS, Kim on 02/03/2022 11:26:05 -------------------------------------------------------------------------------- Vitals Details Patient Name: Date of Service: Adam Howell 02/03/2022 11:15 A M Medical Record Number: [id number] Patient Account Number:  [id number] Date of Birth/Sex: Treating RN: [birth year] (61 y.o. [name] Primary Care Kharis Lapenna: [name] Other Clinician: Referring Amellia Panik: Treating Kadeem Hyle/Extender: [name]: 0 Vital Signs Time Taken: 11:09 Temperature (F): 97.7 Pulse (bpm): 101 Respiratory Rate (breaths/min): 16 Blood Pressure (mmHg): 149/93 Reference Range: 80 - 120 mg / dl Electronic Signature(s) Signed: 02/04/2022 5:38:08 PM By: Gretta Cool, BSN, RN, CWS, Kim RN, BSN Entered By: Gretta Cool, BSN, RN, CWS, Kim on 02/03/2022 11:09:48

## 2022-02-05 NOTE — Progress Notes (Signed)
LAUREANO, HETZER ([id number]) [phone] [email or web address] Page 1 of 5 Visit Report for 02/03/2022 Abuse Risk Screen Details Patient Name: Date of Service: Adam Howell, Adam Howell 02/03/2022 11:15 A M Medical Record Number: [id number] Patient Account Number: [id number] Date of Birth/Sex: Treating RN: 01/05/1961 (61 y.o. [name] Primary Care Collan Schoenfeld: [name] Other Clinician: Referring Elbie Statzer: Treating Essance Gatti/Extender: [name]: 0 Abuse Risk Screen Items Answer ABUSE RISK SCREEN: Has anyone close to you tried to hurt or harm you recentlyo No Do you feel uncomfortable with anyone in your familyo No Has anyone forced you do things that you didnt want to doo No Electronic Signature(s) Signed: 02/04/2022 5:38:08 PM By: Gretta Cool, BSN, RN, CWS, Kim RN, BSN Entered By: Gretta Cool, BSN, RN, CWS, Kim on 02/03/2022 11:23:44 -------------------------------------------------------------------------------- Activities of Daily Living Details Patient Name: Date of Service: Adam Howell, Adam Howell 02/03/2022 11:15 A M Medical Record Number: [id number] Patient Account Number: [id number] Date of Birth/Sex: Treating RN: 09/20/1960 (61 y.o. [name] Primary Care Houda Brau: [name] Other Clinician: Referring Verbena Boeding: Treating Walaa Carel/Extender: [name]: 0 Activities of Daily Living Items Answer Activities of Daily Living (Please select one for each item) Drive Automobile Completely Able T Medications ake Completely Able Use T elephone Completely Able Care for Appearance Completely Able Use T oilet Completely Able Bath / Shower Completely Able Dress Self Completely Able Feed Self Completely Able Walk Completely Able Get In / Out Bed Completely Able Housework Completely GHAZI, RUMPF ([id number]) [phone] [email or web address] Page 2 of 5 Prepare Meals  Completely Able Handle Money Completely Able Shop for Self Completely Able Electronic Signature(s) Signed: 02/04/2022 5:38:08 PM By: Gretta Cool, BSN, RN, CWS, Kim RN, BSN Entered By: Gretta Cool, BSN, RN, CWS, Kim on 02/03/2022 11:23:57 -------------------------------------------------------------------------------- Education Screening Details Patient Name: Date of Service: Adam Howell, Adam Howell 02/03/2022 11:15 A M Medical Record Number: [id number] Patient Account Number: [id number] Date of Birth/Sex: Treating RN: 1960/08/06 (61 y.o. [name] Primary Care Keela Rubert: [name] Other Clinician: Referring Laneya Gasaway: Treating Nicie Milan/Extender: [name]: 0 Primary Learner Assessed: Patient Learning Preferences/Education Level/Primary Language Learning Preference: Demonstration Highest Education Level: High School Preferred Language: English Cognitive Barrier Language Barrier: No Translator Needed: No Emotional Barrier: No Cultural/Religious Beliefs Affecting Medical Care: No Knowledge/Comprehension Knowledge Level: High Comprehension Level: High Ability to understand written instructions: High Ability to understand verbal instructions: High Motivation Anxiety Level: Calm Cooperation: Cooperative Education Importance: Acknowledges Need Interest in Health Problems: Asks Questions Perception: Coherent Willingness to Engage in Self-Management High Activities: Readiness to Engage in Self-Management High Activities: Electronic Signature(s) Signed: 02/04/2022 5:38:08 PM By: Gretta Cool, BSN, RN, CWS, Kim RN, BSN Entered By: Gretta Cool, BSN, RN, CWS, Kim on 02/03/2022 11:24:23 Adam Howell, Adam Howell ([id number]) [phone] [id number] Page 3 of 5 -------------------------------------------------------------------------------- Fall Risk Assessment Details Patient Name: Date of Service: Adam Howell, Adam Howell 02/03/2022 11:15 A M Medical  Record Number: [id number] Patient Account Number: [id number] Date of Birth/Sex: Treating RN: 06-02-1960 (61 y.o. [name] Primary Care Roopa Graver: [name] Other Clinician: Referring Beni Turrell: Treating Bisma Klett/Extender: [name]: 0 Fall Risk Assessment Items Have you had 2 or more falls in the last 12 monthso 0 No Have you had any fall that resulted in injury in the last 12 monthso 0 No FALLS RISK SCREEN History of falling - immediate or within 3 months 0 No Secondary diagnosis (Do you have 2 or more medical diagnoseso) 0  No Ambulatory aid None/bed rest/wheelchair/nurse 0 Yes Crutches/cane/walker 0 No Furniture 0 No Intravenous therapy Access/Saline/Heparin Lock 0 No Gait/Transferring Normal/ bed rest/ wheelchair 0 Yes Weak (short steps with or without shuffle, stooped but able to lift head while walking, may seek 0 No support from furniture) Impaired (short steps with shuffle, may have difficulty arising from chair, head down, impaired 0 No balance) Mental Status Oriented to own ability 0 Yes Electronic Signature(s) Signed: 02/04/2022 5:38:08 PM By: Gretta Cool, BSN, RN, CWS, Kim RN, BSN Entered By: Gretta Cool, BSN, RN, CWS, Kim on 02/03/2022 11:24:36 -------------------------------------------------------------------------------- Foot Assessment Details Patient Name: Date of Service: Adam Howell. 02/03/2022 11:15 A M Medical Record Number: [id number] Patient Account Number: [id number] Date of Birth/Sex: Treating RN: 04/27/1960 (61 y.o. [name] Primary Care Lizbet Cirrincione: [name] Other Clinician: Referring Brigetta Beckstrom: Treating Karim Aiello/Extender: [name]: 0 Foot Assessment Items Site Locations Bennett ([id number]) [phone] [id number] Page 4 of 5 + = Sensation present, - = Sensation absent, C = Callus, U = Ulcer R = Redness, W = Warmth, M =  Maceration, PU = Pre-ulcerative lesion F = Fissure, S = Swelling, D = Dryness Assessment Right: Left: Other Deformity: No No Prior Foot Ulcer: No No Prior Amputation: No No Charcot Joint: No No Ambulatory Status: Ambulatory Without Help Gait: Steady Electronic Signature(s) Signed: 02/04/2022 5:38:08 PM By: Gretta Cool, BSN, RN, CWS, Kim RN, BSN Entered By: Gretta Cool, BSN, RN, CWS, Kim on 02/03/2022 11:24:58 -------------------------------------------------------------------------------- Nutrition Risk Screening Details Patient Name: Date of Service: Adam Howell 02/03/2022 11:15 A M Medical Record Number: [id number] Patient Account Number: [id number] Date of Birth/Sex: Treating RN: [birth year] (61 y.o. [name] Primary Care Yadir Zentner: [name] Other Clinician: Referring Jakira Mcfadden: Treating Halil Rentz/Extender: [name]: 0 Height (in): Weight (lbs): Body Mass Index (BMI): Nutrition Risk Screening Items Score Screening NUTRITION RISK SCREEN: I have an illness or condition that made me change the kind and/or amount of food I eat 0 No I eat fewer than two meals per day 0 No I eat few fruits and vegetables, or milk products 0 No I have three or more drinks of beer, liquor or wine almost every day 0 No I have tooth or mouth problems that make it hard for me to eat 0 No I don't always have enough money to buy the food I need 0 No Adam Howell, Adam Howell ([id number]) [phone] [id number] Page 5 of 5 I eat alone most of the time 0 No I take three or more different prescribed or over-the-counter drugs a day 0 No Without wanting to, I have lost or gained 10 pounds in the last six months 0 No I am not always physically able to shop, cook and/or feed myself 0 No Nutrition Protocols Good Risk Protocol 0 No interventions needed Moderate Risk Protocol High Risk Proctocol Risk Level: Good Risk Score: 0 Electronic  Signature(s) Signed: 02/04/2022 5:38:08 PM By: Gretta Cool, BSN, RN, CWS, Kim RN, BSN Entered By: Gretta Cool, BSN, RN, CWS, Kim on 02/03/2022 11:24:49

## 2022-02-05 NOTE — Progress Notes (Signed)
FRED, FRANZEN ([id number]) [id number] Page 1 of 8 Visit Report for 11/Howell/2023 Chief Complaint Document Details Patient Name: Date of Service: Adam Howell, Adam Howell 11/Howell/2023 11:15 A M Medical Record Number: [id number] Patient Account Number: [id number] Date of Birth/Sex: Treating RN: [birth year] (61 y.o. Adam Howell Primary Care Provider: Marnee Howell Other Clinician: Referring Provider: Treating Provider/Extender: Adam Howell Weeks in Treatment: 0 Information Obtained from: Patient Chief Complaint Right LE Ulcer Electronic Signature(s) Signed: 11/Howell/2023 11:40:51 AM By: Adam Keeler PA-C Entered By: Adam Howell on 11/Howell/2023 11:40:50 -------------------------------------------------------------------------------- HPI Details Patient Name: Date of Service: Adam Howell. 11/Howell/2023 11:15 A M Medical Record Number: [id number] Patient Account Number: [id number] Date of Birth/Sex: Treating RN: [birth year]-Adam Howell (61 y.o. Adam Howell Primary Care Provider: Marnee Howell Other Clinician: Referring Provider: Treating Provider/Extender: Adam Howell Weeks in Treatment: 0 History of Present Illness HPI Description: Admission 05/27/2021 Adam Howell is a 61 year old male with a past medical history of end-stage renal disease on hemodialysis Monday Wednesday Friday, type 2 diabetes, HFrEF that presents to the clinic for a 1 year history of ulcer to his left lower extremity. He states it happened spontaneously. He is currently he states for the past year it has been healing. He is currently using Santyl ointment to the area. He currently denies signs of infection. 3/29; patient presents for follow-up. He has been using Santyl to the wound bed. He has no issues or complaints today. He reports improvement in wound healing. Readmission: 11-Howell-2023 upon evaluation today patient presents for initial inspection here  in the clinic concerning issues that he has been having with a wound on the anterior portion of his right lower leg. He tells me that he had an injury or something that popped up. He is not really sure what it could have started out as a blister then turned dark and then started to get bigger. Subsequently he did end up going to the hospital January 16, 2022 where this was debrided and a wound VAC was placed. Following this time he tells me that he has been dealing with quite a bit of pain he tells me the wound VAC suctioning actually seems to be causing a lot of discomfort as well. Fortunately he does not seem to have any signs of obvious infection which is great news but nonetheless he does seem to be not healing as well as we would like to see. Adam Howell, Adam Howell ([id number]) [id number] Page 2 of 8 Patient does have a history of diabetes mellitus type 2, end-stage renal disease for which she is on renal dialysis, hypertension, congestive heart failure, atrial fibrillation and he is on Eliquis for this. Electronic Signature(s) Signed: 11/Howell/2023 5:13:23 PM By: Adam Keeler PA-C Entered By: Adam Howell on 11/Howell/2023 17:13:23 -------------------------------------------------------------------------------- Physical Exam Details Patient Name: Date of Service: Adam Howell, Adam Howell 11/Howell/2023 11:15 A M Medical Record Number: [id number] Patient Account Number: [id number] Date of Birth/Sex: Treating RN: [birth year] (61 y.o. Adam Howell Primary Care Provider: Marnee Howell Other Clinician: Referring Provider: Treating Provider/Extender: Adam Howell Weeks in Treatment: 0 Constitutional Well-nourished and well-hydrated in no acute distress. Respiratory normal breathing without difficulty. Psychiatric this patient is able to make decisions and demonstrates good insight into disease process. Alert and Oriented x 3. pleasant and  cooperative. Notes Upon inspection patient's wound bed actually showed signs of good granulation epithelization at this point. Fortunately there does not appear to be any signs of infection  locally or systemically which is great news and overall I am extremely pleased with where we stand today. Electronic Signature(s) Signed: 11/Howell/2023 5:13:43 PM By: Adam Keeler PA-C Entered By: Adam Howell on 11/Howell/2023 17:13:42 -------------------------------------------------------------------------------- Physician Orders Details Patient Name: Date of Service: Adam Howell 11/Howell/2023 11:15 A M Medical Record Number: [id number] Patient Account Number: [id number] Date of Birth/Sex: Treating RN: Adam Howell, Adam Howell (61 y.o. Adam Howell Primary Care Provider: Marnee Howell Other Clinician: Referring Provider: Treating Provider/Extender: Adam Howell Weeks in Treatment: 0 Verbal / Phone Orders: No Diagnosis Coding ICD-10 Coding Adam Howell, Adam Howell ([id number]) [id number] Page 3 of 8 Code Description E11.622 Type 2 diabetes mellitus with other skin ulcer I87.331 Chronic venous hypertension (idiopathic) with ulcer and inflammation of right lower extremity L97.812 Non-pressure chronic ulcer of other part of right lower leg with fat layer exposed N18.6 End stage renal disease I10 Essential (primary) hypertension I50.42 Chronic combined systolic (congestive) and diastolic (congestive) heart failure Z99.2 Dependence on renal dialysis I48.0 Paroxysmal atrial fibrillation Z79.01 Long term (current) use of anticoagulants Follow-up Appointments Return Appointment in 1 week. Nurse Visit as needed Bathing/ Shower/ Hygiene May shower with wound dressing protected with water repellent cover or cast protector. Additional Orders / Instructions Follow Nutritious Diet and Increase Protein Intake Activity as tolerated Wound Treatment Wound #2 - Lower Leg Wound  Laterality: Right, Midline Peri-Wound Care: AandD Ointment Discharge Instructions: Apply AandD Ointment as directed Prim Dressing: Silvercel 4 1/4x 4 1/4 (in/in) ary Discharge Instructions: Apply Silvercel 4 1/4x 4 1/4 (in/in) as instructed Secondary Dressing: Zetuvit Plus 4x8 (in/in) Compression Wrap: 3-LAYER WRAP - Profore Lite LF 3 Multilayer Compression Bandaging System Discharge Instructions: Apply 3 multi-layer wrap as prescribed. Electronic Signature(s) Signed: 11/Howell/2023 1:42:47 PM By: Gretta Cool, BSN, RN, CWS, Kim RN, BSN Signed: 11/Howell/2023 5:41:33 PM By: Adam Keeler PA-C Entered By: Gretta Cool BSN, RN, CWS, Kim on 11/Howell/2023 13:42:47 -------------------------------------------------------------------------------- Problem List Details Patient Name: Date of Service: Adam Howell 11/Howell/2023 11:15 A M Medical Record Number: [id number] Patient Account Number: [id number] Date of Birth/Sex: Treating RN: 10[birth year]-Adam Howell (61 y.o. Adam Howell Primary Care Provider: Marnee Howell Other Clinician: Referring Provider: Treating Provider/Extender: Adam Howell Weeks in Treatment: 0 Active Problems ICD-10 Encounter Code Description Active Date MDM Diagnosis E11.622 Type 2 diabetes mellitus with other skin ulcer 11/Howell/2023 No Yes Adam Howell, Adam Howell ([id number]) [id number] Page 4 of 8 I87.331 Chronic venous hypertension (idiopathic) with ulcer and inflammation of right 11/Howell/2023 No Yes lower extremity L97.812 Non-pressure chronic ulcer of other part of right lower leg with fat layer 11/Howell/2023 No Yes exposed N18.6 End stage renal disease 11/Howell/2023 No Yes I10 Essential (primary) hypertension 11/Howell/2023 No Yes I50.42 Chronic combined systolic (congestive) and diastolic (congestive) heart failure 11/Howell/2023 No Yes Z99.2 Dependence on renal dialysis 11/Howell/2023 No Yes I48.0 Paroxysmal atrial fibrillation 11/Howell/2023 No Yes Z79.01 Long term  (current) use of anticoagulants 11/Howell/2023 No Yes Inactive Problems Resolved Problems Electronic Signature(s) Signed: 11/Howell/2023 11:40:34 AM By: Adam Keeler PA-C Entered By: Adam Howell on 11/Howell/2023 11:40:33 -------------------------------------------------------------------------------- Progress Note Details Patient Name: Date of Service: Adam Howell. 11/Howell/2023 11:15 A M Medical Record Number: [id number] Patient Account Number: [id number] Date of Birth/Sex: Treating RN: Adam Howell[birth year] (61 y.o. Adam Howell Primary Care Provider: Marnee Howell Other Clinician: Referring Provider: Treating Provider/Extender: Adam Howell Weeks in Treatment: 0 Subjective Chief Complaint Information obtained from Patient Right LE Ulcer History of Present Illness (HPI) Admission 05/27/2021 Adam Howell  is a 61 year old male with a past medical history of end-stage renal disease on hemodialysis Monday Wednesday Friday, type 2 diabetes, JETHRO, RADKE ([id number]) [id number] Page 5 of 8 HFrEF that presents to the clinic for a 1 year history of ulcer to his left lower extremity. He states it happened spontaneously. He is currently he states for the past year it has been healing. He is currently using Santyl ointment to the area. He currently denies signs of infection. 3/29; patient presents for follow-up. He has been using Santyl to the wound bed. He has no issues or complaints today. He reports improvement in wound healing. Readmission: 11-Howell-2023 upon evaluation today patient presents for initial inspection here in the clinic concerning issues that he has been having with a wound on the anterior portion of his right lower leg. He tells me that he had an injury or something that popped up. He is not really sure what it could have started out as a blister then turned dark and then started to get bigger. Subsequently he did end up going to  the hospital January 16, 2022 where this was debrided and a wound VAC was placed. Following this time he tells me that he has been dealing with quite a bit of pain he tells me the wound VAC suctioning actually seems to be causing a lot of discomfort as well. Fortunately he does not seem to have any signs of obvious infection which is great news but nonetheless he does seem to be not healing as well as we would like to see. Patient does have a history of diabetes mellitus type 2, end-stage renal disease for which she is on renal dialysis, hypertension, congestive heart failure, atrial fibrillation and he is on Eliquis for this. Patient History Allergies tramadol Social History Never smoker, Marital Status - Single, Alcohol Use - Never, Drug Use - No History, Caffeine Use - Never. Medical History Cardiovascular Patient has history of Congestive Heart Failure, Hypertension Endocrine Patient has history of Type II Diabetes Genitourinary Patient has history of End Stage Renal Disease - Dialysis M, W, F Neurologic Patient has history of Neuropathy - feet Review of Systems (ROS) Integumentary (Skin) Complains or has symptoms of Wounds - right lower leg, Bleeding or bruising tendency. Objective Constitutional Well-nourished and well-hydrated in no acute distress. Vitals Time Taken: 11:09 AM, Temperature: 97.7 F, Pulse: 101 bpm, Respiratory Rate: 16 breaths/min, Blood Pressure: 149/93 mmHg. Respiratory normal breathing without difficulty. Psychiatric this patient is able to make decisions and demonstrates good insight into disease process. Alert and Oriented x 3. pleasant and cooperative. General Notes: Upon inspection patient's wound bed actually showed signs of good granulation epithelization at this point. Fortunately there does not appear to be any signs of infection locally or systemically which is great news and overall I am extremely pleased with where we stand today. Integumentary  (Hair, Skin) Wound #2 status is Open. Original cause of wound was Gradually Appeared. The date acquired was: 8/Howell/2023. The wound is located on the Right,Midline Lower Leg. The wound measures 10cm length x 8.5cm width x 0.2cm depth; 66.759cm^2 area and 13.352cm^3 volume. There is Fat Layer (Subcutaneous Tissue) exposed. There is no tunneling or undermining noted. There is a medium amount of sanguinous drainage noted. The wound margin is flat and intact. There is small (1-33%) granulation within the wound bed. There is a large (67-100%) amount of necrotic tissue within the wound bed. Assessment Active Problems ICD-10 Type 2 diabetes mellitus with other skin ulcer Chronic  venous hypertension (idiopathic) with ulcer and inflammation of right lower extremity Non-pressure chronic ulcer of other part of right lower leg with fat layer exposed End stage renal disease Essential (primary) hypertension Chronic combined systolic (congestive) and diastolic (congestive) heart failure Dependence on renal dialysis Paroxysmal atrial fibrillation Long term (current) use of anticoagulants Adam Howell, Adam Howell ([id number]) [id number] Page 6 of 8 Procedures Wound #2 Pre-procedure diagnosis of Wound #2 is a Diabetic Wound/Ulcer of the Lower Extremity located on the Right,Midline Lower Leg . There was a Three Layer Compression Therapy Procedure by [name], RN. Post procedure Diagnosis Wound #2: Same as Pre-Procedure Notes: Patient has tolerated wraps in the past.. Plan Follow-up Appointments: Return Appointment in 1 week. Nurse Visit as needed Bathing/ Shower/ Hygiene: May shower with wound dressing protected with water repellent cover or cast protector. Additional Orders / Instructions: Follow Nutritious Diet and Increase Protein Intake Activity as tolerated WOUND #2: - Lower Leg Wound Laterality: Right, Midline Peri-Wound Care: AandD Ointment Discharge Instructions: Apply  AandD Ointment as directed Prim Dressing: Silvercel 4 1/4x 4 1/4 (in/in) ary Discharge Instructions: Apply Silvercel 4 1/4x 4 1/4 (in/in) as instructed Secondary Dressing: Zetuvit Plus 4x8 (in/in) Com pression Wrap: 3-LAYER WRAP - Profore Lite LF 3 Multilayer Compression Bandaging System Discharge Instructions: Apply 3 multi-layer wrap as prescribed. 1. Based on what I see I do feel like that the patient is having a lot of discomfort I did not perform any debridement today I want to use something to try to soften up a little bit of the necrotic debris here and he is in agreement with that plan. Will get a use AandD ointment around the edges of the wound silver cell to the surface of the wound and then a Zetuvit to cover followed by 3 layer compression wrap. 2. I am also going to recommend that he should elevate his leg is much as possible and to discontinue the wound VAC as I feel like this is actually more problematic it seems to be eating away and causing excoriation around the edges of the wound which is not good as that can make it bigger not smaller. Obviously that is the wrong direction they are wanting to take here and he actually was very pleased to get rid of the wound VAC to be on this. He tells me it has been hurting quite significantly. 3. We will monitor for any signs of infection or worsening if anything changes he will let me know otherwise I will see him in 1 week for reevaluation. We will see patient back for reevaluation in 1 week here in the clinic. If anything worsens or changes patient will contact our office for additional recommendations. Electronic Signature(s) Signed: 11/Howell/2023 5:15:04 PM By: Adam Keeler PA-C Entered By: Adam Howell on 11/Howell/2023 17:15:04 -------------------------------------------------------------------------------- ROS/PFSH Details Patient Name: Date of Service: Adam Howell. 11/Howell/2023 11:15 A M Medical Record Number:  [id number] Patient Account Number: [id number] Date of Birth/Sex: Treating RN: Adam Howell[birth year] (How[age] y.o. Adam Howell Primary Care Provider: Marnee Howell Other Clinician: Referring Provider: Treating Provider/Extender: Adam Howell Weeks in Treatment: [address] Adam Howell, Adam Howell ([id number]) [id number] Page 7 of 8 Integumentary (Skin) Complaints and Symptoms: Positive for: Wounds - right lower leg; Bleeding or bruising tendency Cardiovascular Medical History: Positive for: Congestive Heart Failure; Hypertension Endocrine Medical History: Positive for: Type II Diabetes Time with diabetes: 20 years Treated with: Insulin Genitourinary Medical History: Positive for: End Stage Renal Disease - Dialysis M, W, F  Musculoskeletal Neurologic Medical History: Positive for: Neuropathy - feet Immunizations Pneumococcal Vaccine: Received Pneumococcal Vaccination: Yes Received Pneumococcal Vaccination On or After 60th Birthday: No Implantable Devices None Family and Social History Never smoker; Marital Status - Single; Alcohol Use: Never; Drug Use: No History; Caffeine Use: Never Electronic Signature(s) Signed: 11/Howell/2023 5:41:33 PM By: Adam Keeler PA-C Signed: 02/04/2022 5:38:08 PM By: Gretta Cool, BSN, RN, CWS, Kim RN, BSN Entered By: Gretta Cool, BSN, RN, CWS, Kim on 11/Howell/2023 11:23:38 -------------------------------------------------------------------------------- SuperBill Details Patient Name: Date of Service: Adam Howell 11/Howell/2023 Medical Record Number: [id number] Patient Account Number: [id number] Date of Birth/Sex: Treating RN: [birth year] (61 y.o. Adam Howell Primary Care Provider: Marnee Howell Other Clinician: Referring Provider: Treating Provider/Extender: Adam Howell Weeks in Treatment: 0 Diagnosis Coding ICD-10 Codes Code Description E11.622 Type 2 diabetes mellitus with other skin ulcer Adam Howell, HARROWER  ([id number]) [id number] Page 8 of 8 I87.331 Chronic venous hypertension (idiopathic) with ulcer and inflammation of right lower extremity L97.812 Non-pressure chronic ulcer of other part of right lower leg with fat layer exposed N18.6 End stage renal disease I10 Essential (primary) hypertension I50.42 Chronic combined systolic (congestive) and diastolic (congestive) heart failure Z99.2 Dependence on renal dialysis I48.0 Paroxysmal atrial fibrillation Z79.01 Long term (current) use of anticoagulants Facility Procedures : CPT4 Code: 06301601 Description: 99213 - WOUND CARE VISIT-LEV 3 EST PT Modifier: Quantity: 1 : CPT4 Code: 09323557 Description: (Facility Use Only) 32202RK - APPLY MULTLAY COMPRS LWR RT LEG Modifier: Quantity: 1 Physician Procedures : CPT4 Code Description Modifier [id number] WC PHYS LEVEL 3 NEW PT ICD-10 Diagnosis Description E11.622 Type 2 diabetes mellitus with other skin ulcer I87.331 Chronic venous hypertension (idiopathic) with ulcer and inflammation of right lower extremity  L97.812 Non-pressure chronic ulcer of other part of right lower leg with fat layer exposed N18.6 End stage renal disease Quantity: 1 Electronic Signature(s) Signed: 11/Howell/2023 5:15:29 PM By: Adam Keeler PA-C Previous Signature: 11/Howell/2023 1:44:28 PM Version By: Gretta Cool, BSN, RN, CWS, Kim RN, BSN Entered By: Adam Howell on 11/Howell/2023 17:15:29

## 2022-02-10 ENCOUNTER — Telehealth: Payer: Medicare Other | Admitting: Nurse Practitioner

## 2022-02-10 ENCOUNTER — Ambulatory Visit: Payer: Medicare Other | Admitting: Physician Assistant

## 2022-02-16 ENCOUNTER — Ambulatory Visit: Payer: Medicare Other | Admitting: Nurse Practitioner

## 2022-02-17 ENCOUNTER — Encounter: Payer: Medicare Other | Attending: Physician Assistant | Admitting: Physician Assistant

## 2022-02-17 DIAGNOSIS — I5042 Chronic combined systolic (congestive) and diastolic (congestive) heart failure: Secondary | ICD-10-CM | POA: Diagnosis not present

## 2022-02-17 DIAGNOSIS — I132 Hypertensive heart and chronic kidney disease with heart failure and with stage 5 chronic kidney disease, or end stage renal disease: Secondary | ICD-10-CM | POA: Diagnosis not present

## 2022-02-17 DIAGNOSIS — E11621 Type 2 diabetes mellitus with foot ulcer: Secondary | ICD-10-CM | POA: Diagnosis present

## 2022-02-17 DIAGNOSIS — E1122 Type 2 diabetes mellitus with diabetic chronic kidney disease: Secondary | ICD-10-CM | POA: Diagnosis not present

## 2022-02-17 DIAGNOSIS — N186 End stage renal disease: Secondary | ICD-10-CM | POA: Insufficient documentation

## 2022-02-17 DIAGNOSIS — I480 Paroxysmal atrial fibrillation: Secondary | ICD-10-CM | POA: Diagnosis not present

## 2022-02-17 DIAGNOSIS — Z992 Dependence on renal dialysis: Secondary | ICD-10-CM | POA: Diagnosis not present

## 2022-02-17 DIAGNOSIS — L97812 Non-pressure chronic ulcer of other part of right lower leg with fat layer exposed: Secondary | ICD-10-CM | POA: Insufficient documentation

## 2022-02-17 DIAGNOSIS — Z7901 Long term (current) use of anticoagulants: Secondary | ICD-10-CM | POA: Diagnosis not present

## 2022-02-17 DIAGNOSIS — I87331 Chronic venous hypertension (idiopathic) with ulcer and inflammation of right lower extremity: Secondary | ICD-10-CM | POA: Diagnosis not present

## 2022-02-17 DIAGNOSIS — E11622 Type 2 diabetes mellitus with other skin ulcer: Secondary | ICD-10-CM | POA: Insufficient documentation

## 2022-02-17 NOTE — Progress Notes (Addendum)
TYREESE, THAIN ([id number]) [id number] Page 1 of 8 Visit Report for 02/17/2022 Chief Complaint Document Details Patient Name: Date of Service: Adam, Howell 02/17/2022 12:45 PM Medical Record Number: [id number] Patient Account Number: [id number] Date of Birth/Sex: Treating RN: 02/05/1961 (61 y.o. Adam Howell Primary Care Provider: [name] Other Clinician: Massie Kluver Referring Provider: Treating Provider/Extender: [name]: 2 Information Obtained from: Patient Chief Complaint Right LE Ulcer Electronic Signature(s) Signed: 02/17/2022 1:00:58 PM By: Worthy Keeler PA-C Entered By: Worthy Keeler on 02/17/2022 13:00:58 -------------------------------------------------------------------------------- Debridement Details Patient Name: Date of Service: Adam Howell 02/17/2022 12:45 PM Medical Record Number: [id number] Patient Account Number: [id number] Date of Birth/Sex: Treating RN: 02-06-1961 (61 y.o. Adam Howell Primary Care Provider: [name] Other Clinician: Massie Kluver Referring Provider: Treating Provider/Extender: [name]: 2 Debridement Performed for Assessment: Wound #2 Right,Midline Lower Leg Performed By: Physician Adam Sams., PA-C Debridement Type: Debridement Severity of Tissue Pre Debridement: Muscle involvement without necrosis Level of Consciousness (Pre-procedure): Awake and Alert Pre-procedure Verification/Time Out Yes - 13:18 Taken: Start Time: 13:18 T Area Debrided (L x W): otal 5 (cm) x 5 (cm) = 25 (cm) Tissue and other material debrided: Viable, Non-Viable, Eschar, Slough, Subcutaneous, Skin: Dermis , Skin: Epidermis, Slough Level: Skin/Subcutaneous Tissue Debridement Description: Excisional Instrument: Curette Bleeding: Minimum Hemostasis Achieved: Pressure Response to Treatment: Procedure was tolerated  well Level of Consciousness (Post- Awake and Alert procedure): Adam Howell, Adam Howell ([id number]) [id number] Page 2 of 8 Post Debridement Measurements of Total Wound Length: (cm) 14 Width: (cm) 11.8 Depth: (cm) 0.3 Volume: (cm) 38.924 Character of Wound/Ulcer Post Debridement: Stable Severity of Tissue Post Debridement: Muscle involvement without necrosis Post Procedure Diagnosis Same as Pre-procedure Electronic Signature(s) Signed: 02/17/2022 4:28:12 PM By: Worthy Keeler PA-C Signed: 02/17/2022 5:15:20 PM By: Gretta Cool, BSN, RN, CWS, Kim RN, BSN Signed: 02/20/2022 10:21:17 AM By: Massie Kluver Entered By: Massie Kluver on 02/17/2022 13:30:01 -------------------------------------------------------------------------------- HPI Details Patient Name: Date of Service: Adam Howell 02/17/2022 12:45 PM Medical Record Number: [id number] Patient Account Number: [id number] Date of Birth/Sex: Treating RN: 02-20-1961 (61 y.o. Adam Howell Primary Care Provider: [name] Other Clinician: Massie Kluver Referring Provider: Treating Provider/Extender: [name]: 2 History of Present Illness HPI Description: Admission 05/27/2021 [name] is a 61 year old male with a past medical history of end-stage renal disease on hemodialysis Monday Wednesday Friday, type 2 diabetes, HFrEF that presents to the clinic for a 1 year history of ulcer to his left lower extremity. He states it happened spontaneously. He is currently he states for the past year it has been healing. He is currently using Santyl ointment to the area. He currently denies signs of infection. 3/29; patient presents for follow-up. He has been using Santyl to the wound bed. He has no issues or complaints today. He reports improvement in wound healing. Readmission: 02-03-2022 upon evaluation today patient presents for initial inspection here in the clinic  concerning issues that he has been having with a wound on the anterior portion of his right lower leg. He tells me that he had an injury or something that popped up. He is not really sure what it could have started out as a blister then turned dark and then started to get bigger. Subsequently he did end up going to the hospital January 16, 2022 where this was debrided and a wound VAC was placed. Following  this time he tells me that he has been dealing with quite a bit of pain he tells me the wound VAC suctioning actually seems to be causing a lot of discomfort as well. Fortunately he does not seem to have any signs of obvious infection which is great news but nonetheless he does seem to be not healing as well as we would like to see. Patient does have a history of diabetes mellitus type 2, end-stage renal disease for which she is on renal dialysis, hypertension, congestive heart failure, atrial fibrillation and he is on Eliquis for this. 02-17-2022 upon evaluation today patient appears to be doing well currently in regard to some parts of his wound although he still has areas of necrosis around the edges. 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 I think that we need to try to do what we can do to clean up the edges of the wound there central parts are showing signs of good improvement but around the edge he still has a lot of necrotic tissue noted unfortunately. Electronic Signature(s) Signed: 02/17/2022 1:36:07 PM By: Worthy Keeler PA-C Entered By: Worthy Keeler on 02/17/2022 13:36:06 Adam Howell, Adam Howell ([id number]) [id number] Page 3 of 8 -------------------------------------------------------------------------------- Physical Exam Details Patient Name: Date of Service: Adam Howell, Adam Howell 02/17/2022 12:45 PM Medical Record Number: [id number] Patient Account Number: [id number] Date of Birth/Sex: Treating RN: 08-May-1960 (61  y.o. Adam Howell Primary Care Provider: [name] Other Clinician: Massie Kluver Referring Provider: Treating Provider/Extender: [name]: 2 Constitutional Well-nourished and well-hydrated in no acute distress. Respiratory normal breathing without difficulty. Psychiatric this patient is able to make decisions and demonstrates good insight into disease process. Alert and Oriented x 3. pleasant and cooperative. Notes Upon inspection patient's wound bed actually showed signs of necrotic tissue at this time and again I did perform debridement lightly as I could try to trim this away. He tolerated that today without complication and postdebridement things appear to be doing better although I think we need to try something a little different to see if we cannot get this movement on the right direction. Right now I think a Dakin's moistened gauze dressing daily would be the right thing to do. Electronic Signature(s) Signed: 02/17/2022 1:36:51 PM By: Worthy Keeler PA-C Entered By: Worthy Keeler on 02/17/2022 13:36:51 -------------------------------------------------------------------------------- Physician Orders Details Patient Name: Date of Service: Adam Howell, Adam Howell 02/17/2022 12:45 PM Medical Record Number: [id number] Patient Account Number: [id number] Date of Birth/Sex: Treating RN: [birth year] (61 y.o. Adam Howell, Adam Howell Primary Care Provider: [name] Other Clinician: Massie Kluver Referring Provider: Treating Provider/Extender: [name]: 2 Verbal / Phone Orders: No Diagnosis Coding ICD-10 Coding Code Description E11.622 Type 2 diabetes mellitus with other skin ulcer I87.331 Chronic venous hypertension (idiopathic) with ulcer and inflammation of right lower extremity L97.812 Non-pressure chronic ulcer of other part of right lower leg with fat layer exposed N18.6 End stage renal  disease I10 Essential (primary) hypertension I50.42 Chronic combined systolic (congestive) and diastolic (congestive) heart failure DEYLAN, [place] ([id number]) [id number] Page 4 of 8 Z99.2 Dependence on renal dialysis I48.0 Paroxysmal atrial fibrillation Z79.01 Long term (current) use of anticoagulants Follow-up Appointments Return Appointment in 1 week. Nurse Visit as needed [place], [place] [phone] Fax: [phone] Bathing/ Shower/ Hygiene May shower with wound dressing protected with water repellent cover or cast  protector. Anesthetic (Use 'Patient Medications' Section for Anesthetic Order Entry) Lidocaine applied to wound bed Additional Orders / Instructions Follow Nutritious Diet and Increase Protein Intake Activity as tolerated Wound Treatment Wound #2 - Lower Leg Wound Laterality: Right, Midline Peri-Wound Care: AandD Ointment 1 x Per Day/30 Days Discharge Instructions: Apply AandD Ointment to periwound Prim Dressing: Gauze 1 x Per Day/30 Days ary Discharge Instructions: moisten with Dakins Solution and apply directly to wound bed Secondary Dressing: ABD Pad 5x9 (in/in) 1 x Per Day/30 Days Discharge Instructions: Cover with ABD pad Secondary Dressing: Kerlix 4.5 x 4.1 (in/yd) 1 x Per Day/30 Days Discharge Instructions: Apply Kerlix 4.5 x 4.1 (in/yd) as instructed Secured With: ACE WRAP - 35M ACE Elastic Bandage With VELCRO Brand Closure, 4 (in) 1 x Per Day/30 Days Electronic Signature(s) Signed: 02/19/2022 4:35:46 PM By: Worthy Keeler PA-C Signed: 02/20/2022 10:21:17 AM By: Massie Kluver Previous Signature: 02/17/2022 4:28:12 PM Version By: Worthy Keeler PA-C Entered By: Massie Kluver on 02/17/2022 16:57:50 -------------------------------------------------------------------------------- Problem List Details Patient Name: Date of Service: Adam Howell 02/17/2022 12:45 PM Medical Record  Number: [id number] Patient Account Number: [id number] Date of Birth/Sex: Treating RN: 1960-08-28 (61 y.o. Adam Howell Primary Care Provider: [name] Other Clinician: Massie Kluver Referring Provider: Treating Provider/Extender: [name]: 2 Active Problems ICD-10 Encounter Code Description Active Date MDM Diagnosis E11.622 Type 2 diabetes mellitus with other skin ulcer 02/03/2022 No Yes Adam Howell, Adam Howell ([id number]) [id number] Page 5 of 8 I87.331 Chronic venous hypertension (idiopathic) with ulcer and inflammation of right 02/03/2022 No Yes lower extremity L97.812 Non-pressure chronic ulcer of other part of right lower leg with fat layer 02/03/2022 No Yes exposed N18.6 End stage renal disease 02/03/2022 No Yes I10 Essential (primary) hypertension 02/03/2022 No Yes I50.42 Chronic combined systolic (congestive) and diastolic (congestive) heart failure 02/03/2022 No Yes Z99.2 Dependence on renal dialysis 02/03/2022 No Yes I48.0 Paroxysmal atrial fibrillation 02/03/2022 No Yes Z79.01 Long term (current) use of anticoagulants 02/03/2022 No Yes Inactive Problems Resolved Problems Electronic Signature(s) Signed: 02/17/2022 1:00:54 PM By: Worthy Keeler PA-C Entered By: Worthy Keeler on 02/17/2022 13:00:54 -------------------------------------------------------------------------------- Progress Note Details Patient Name: Date of Service: Adam Howell 02/17/2022 12:45 PM Medical Record Number: [id number] Patient Account Number: [id number] Date of Birth/Sex: Treating RN: [birth year] (61 y.o. Adam Howell Primary Care Provider: [name] Other Clinician: Massie Kluver Referring Provider: Treating Provider/Extender: [name]: 2 Subjective Chief Complaint Information obtained from Patient Right LE Ulcer History of Present Illness (HPI) Adam Howell, Adam Howell  ([id number]) [id number] Page 6 of 8 Admission 05/27/2021 [name] is a 61 year old male with a past medical history of end-stage renal disease on hemodialysis Monday Wednesday Friday, type 2 diabetes, HFrEF that presents to the clinic for a 1 year history of ulcer to his left lower extremity. He states it happened spontaneously. He is currently he states for the past year it has been healing. He is currently using Santyl ointment to the area. He currently denies signs of infection. 3/29; patient presents for follow-up. He has been using Santyl to the wound bed. He has no issues or complaints today. He reports improvement in wound healing. Readmission: 02-03-2022 upon evaluation today patient presents for initial inspection here in the clinic concerning issues that he has been having with a wound on the anterior portion of his right lower leg. He tells me that he had an injury or something that popped  up. He is not really sure what it could have started out as a blister then turned dark and then started to get bigger. Subsequently he did end up going to the hospital January 16, 2022 where this was debrided and a wound VAC was placed. Following this time he tells me that he has been dealing with quite a bit of pain he tells me the wound VAC suctioning actually seems to be causing a lot of discomfort as well. Fortunately he does not seem to have any signs of obvious infection which is great news but nonetheless he does seem to be not healing as well as we would like to see. Patient does have a history of diabetes mellitus type 2, end-stage renal disease for which she is on renal dialysis, hypertension, congestive heart failure, atrial fibrillation and he is on Eliquis for this. 02-17-2022 upon evaluation today patient appears to be doing well currently in regard to some parts of his wound although he still has areas of necrosis around the edges. This actually does  appear to be a calciphylaxis type issue based on what I am seeing. I think that we need to try to do what we can do to clean up the edges of the wound there central parts are showing signs of good improvement but around the edge he still has a lot of necrotic tissue noted unfortunately. Objective Constitutional Well-nourished and well-hydrated in no acute distress. Vitals Time Taken: 12:56 PM, Temperature: 98.2 F, Pulse: 89 bpm, Respiratory Rate: 18 breaths/min, Blood Pressure: 179/92 mmHg. Respiratory normal breathing without difficulty. Psychiatric this patient is able to make decisions and demonstrates good insight into disease process. Alert and Oriented x 3. pleasant and cooperative. General Notes: Upon inspection patient's wound bed actually showed signs of necrotic tissue at this time and again I did perform debridement lightly as I could try to trim this away. He tolerated that today without complication and postdebridement things appear to be doing better although I think we need to try something a little different to see if we cannot get this movement on the right direction. Right now I think a Dakin's moistened gauze dressing daily would be the right thing to do. Integumentary (Hair, Skin) Wound #2 status is Open. Original cause of wound was Gradually Appeared. The date acquired was: 11/03/2021. The wound has been in treatment 2 weeks. The wound is located on the Right,Midline Lower Leg. The wound measures 13cm length x 9.7cm width x 0.3cm depth; 99.039cm^2 area and 29.712cm^3 volume. There is Fat Layer (Subcutaneous Tissue) exposed. There is no tunneling or undermining noted. There is a medium amount of sanguinous drainage noted. The wound margin is flat and intact. There is small (1-33%) granulation within the wound bed. There is a large (67-100%) amount of necrotic tissue within the wound bed. Assessment Active Problems ICD-10 Type 2 diabetes mellitus with other skin  ulcer Chronic venous hypertension (idiopathic) with ulcer and inflammation of right lower extremity Non-pressure chronic ulcer of other part of right lower leg with fat layer exposed End stage renal disease Essential (primary) hypertension Chronic combined systolic (congestive) and diastolic (congestive) heart failure Dependence on renal dialysis Paroxysmal atrial fibrillation Long term (current) use of anticoagulants Procedures Wound #2 Pre-procedure diagnosis of Wound #2 is a Diabetic Wound/Ulcer of the Lower Extremity located on the Right,Midline Lower Leg .Severity of Tissue Pre Debridement is: Muscle involvement without necrosis. There was a Excisional Skin/Subcutaneous Tissue Debridement with a total area of 25 sq cm performed by  Jeri Cos E., PA-C. With the following instrument(s): Curette to remove Viable and Non-Viable tissue/material. Material removed includes JULES, BATY ([id number]) [id number] Page 7 of 8 Subcutaneous Tissue, Slough, Skin: Dermis, and Skin: Epidermis. A time out was conducted at 13:18, prior to the start of the procedure. A Minimum amount of bleeding was controlled with Pressure. The procedure was tolerated well. Post Debridement Measurements: 14cm length x 11.8cm width x 0.3cm depth; 38.924cm^3 volume. Character of Wound/Ulcer Post Debridement is stable. Severity of Tissue Post Debridement is: Muscle involvement without necrosis. Post procedure Diagnosis Wound #2: Same as Pre-Procedure Plan Follow-up Appointments: Return Appointment in 1 week. Nurse Visit as needed Bathing/ Shower/ Hygiene: May shower with wound dressing protected with water repellent cover or cast protector. Anesthetic (Use 'Patient Medications' Section for Anesthetic Order Entry): Lidocaine applied to wound bed Additional Orders / Instructions: Follow Nutritious Diet and Increase Protein Intake Activity as tolerated WOUND #2: - Lower Leg Wound  Laterality: Right, Midline Peri-Wound Care: AandD Ointment 1 x Per Day/30 Days Discharge Instructions: Apply AandD Ointment to periwound Prim Dressing: Gauze 1 x Per Day/30 Days ary Discharge Instructions: moisten with Dakins Solution and apply directly to wound bed Secondary Dressing: ABD Pad 5x9 (in/in) 1 x Per Day/30 Days Discharge Instructions: Cover with ABD pad Secondary Dressing: Kerlix 4.5 x 4.1 (in/yd) 1 x Per Day/30 Days Discharge Instructions: Apply Kerlix 4.5 x 4.1 (in/yd) as instructed Secured With: ACE WRAP - 15M ACE Elastic Bandage With VELCRO Brand Closure, 4 (in) 1 x Per Day/30 Days 1. I would recommend that we switch to Dakin's moistened gauze as the dressing to try to clean up the edges of the wound this has done very well with patients in the past that I had with similar issues with calciphylaxis to what we are seeing currently. 2. I am also can recommend the patient should continue to monitor for any signs of infection or worsening. Obviously if anything changes he knows he can contact the office and let me know. With that being said I am hopeful that we will be able to see things improve with the use of the Dakin's. 3. I will consider the possibility of sodium thiosulfate and discussing this with his dialysis center depending on how things progress but I want to see how this does over the next week. We will see patient back for reevaluation in 1 week here in the clinic. If anything worsens or changes patient will contact our office for additional recommendations. Electronic Signature(s) Signed: 02/17/2022 1:37:24 PM By: Worthy Keeler PA-C Entered By: Worthy Keeler on 02/17/2022 13:37:23 -------------------------------------------------------------------------------- SuperBill Details Patient Name: Date of Service: Adam Howell 02/17/2022 Medical Record Number: [id number] Patient Account Number: [id number] Date of Birth/Sex: Treating RN: [birth year] (61 y.o. Adam Howell Primary Care Provider: [name] Other Clinician: Massie Kluver Referring Provider: Treating Provider/Extender: [name]: 2 Diagnosis Coding ICD-10 Codes Code Description [id number] Type 2 diabetes mellitus with other skin ulcer I87.331 Chronic venous hypertension (idiopathic) with ulcer and inflammation of right lower extremity Adam Howell, OHMS ([id number]) [id number] Page 8 of 8 L97.812 Non-pressure chronic ulcer of other part of right lower leg with fat layer exposed N18.6 End stage renal disease I10 Essential (primary) hypertension I50.42 Chronic combined systolic (congestive) and diastolic (congestive) heart failure Z99.2 Dependence on renal dialysis I48.0 Paroxysmal atrial fibrillation Z79.01 Long term (current) use of anticoagulants Facility Procedures : CPT4 Code: [id number] Description: 11042 - DEB SUBQ TISSUE  20 SQ CM/< ICD-10 Diagnosis Description W29.937 Non-pressure chronic ulcer of other part of right lower leg with fat layer exp Modifier: osed Quantity: 1 : CPT4 Code: [id number] Description: 81017 - DEB SUBQ TISS EA ADDL 20CM ICD-10 Diagnosis Description P10.258 Non-pressure chronic ulcer of other part of right lower leg with fat layer exp Modifier: osed Quantity: 1 Physician Procedures : CPT4 Code Description Modifier [id number] 11042 - WC PHYS SUBQ TISS 20 SQ CM ICD-10 Diagnosis Description M35.361 Non-pressure chronic 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

## 2022-02-18 ENCOUNTER — Telehealth: Payer: Self-pay | Admitting: Nurse Practitioner

## 2022-02-18 ENCOUNTER — Ambulatory Visit: Payer: Self-pay | Admitting: *Deleted

## 2022-02-18 NOTE — Telephone Encounter (Signed)
Pt Per agent:  "Ptcalled reporting that his pain medication is not strong enough. He is having to double his dosage for relief. He does not know the name of the medication he takes."   [facility] [address]), [place] [address]  Phone: [phone] Fax: [phone]      Chief Complaint:Pain, Medication Question  Symptoms: Increased pain S/P debridement of yesterday. Frequency:  Pertinent Negatives: Patient denies  Disposition: [] ED /[] Urgent Care (no appt availability in office) / [] Appointment(In office/virtual)/ []  [facility]/ [] Home Care/ [] Refused Recommended Disposition /[] [facility]/ [x]  Follow-up with PCP Additional Notes: Pt is evasive historian. States picked up [place] yesterday "But was only the 5mg , I need the 10mg . HYDROcodone-acetaminophen (NORCO) 5-325 MG tablet . Unsure who prescribed keeps saying [facility]. Determined Dr. Kerman Passey prescribed the 5 mg. Pt requesting to go back to the 10-325mg . ADvised to call prescriber, who also did the debridement ,states "Practice gives me the 10mg ."  Please advise. Reason for Disposition  Medicine patch causing local rash or itching    NOt prescribed by PCP,   NO PATCH  Answer Assessment - Initial Assessment Questions 1. NAME of MEDICINE: "What medicine(s) are you calling about?"    "Norco" 2. QUESTION: "What is your question?" (e.g., double dose of medicine, side effect)     Need to 10mg , not the 5mg  3. PRESCRIBER: "Who prescribed the medicine?" Reason: if prescribed by specialist, call should be referred to that group.     Dr. Kerman Passey 4. SYMPTOMS: "Do you have any symptoms?" If Yes, ask: "What symptoms are you having?"  "How bad are the symptoms (e.g., mild, moderate, severe)     Increased pain  Protocols used: Medication Question Call-A-AH

## 2022-02-18 NOTE — Telephone Encounter (Signed)
Verbal orders were given 

## 2022-02-18 NOTE — Telephone Encounter (Signed)
Pt called reporting that his pain medication is not strong enough. He is having to double his dosage for relief. He does not know the name of the medication he takes.   [facility] [address]), [place] [address]  [place] [address]  Phone: [phone] Fax: [phone]

## 2022-02-18 NOTE — Telephone Encounter (Signed)
Copied from [place] [phone]. Topic: Quick Communication - Home Health Verbal Orders >> Feb 18, 2022 11:48 AM Everette C wrote: Caller/Agency: [facility] / [facility]  Callback Number: [phone] Requesting OT/PT/Skilled Nursing/Social Work/Speech Therapy: skilled nursing, wound care right leg  Frequency: 2w9

## 2022-02-19 ENCOUNTER — Ambulatory Visit (INDEPENDENT_AMBULATORY_CARE_PROVIDER_SITE_OTHER): Payer: Medicare Other | Admitting: Nurse Practitioner

## 2022-02-19 ENCOUNTER — Encounter: Payer: Self-pay | Admitting: Nurse Practitioner

## 2022-02-19 VITALS — BP 147/80 | HR 80 | Temp 98.1°F | Ht 75.0 in | Wt 255.0 lb

## 2022-02-19 DIAGNOSIS — L97919 Non-pressure chronic ulcer of unspecified part of right lower leg with unspecified severity: Secondary | ICD-10-CM | POA: Diagnosis not present

## 2022-02-19 DIAGNOSIS — G894 Chronic pain syndrome: Secondary | ICD-10-CM | POA: Diagnosis not present

## 2022-02-19 DIAGNOSIS — E11622 Type 2 diabetes mellitus with other skin ulcer: Secondary | ICD-10-CM | POA: Diagnosis not present

## 2022-02-19 MED ORDER — HYDROCODONE-ACETAMINOPHEN 10-325 MG PO TABS: 1.0000 | ORAL_TABLET | Freq: Three times a day (TID) | ORAL | 0 refills | Status: DC | PRN

## 2022-02-19 NOTE — Assessment & Plan Note (Signed)
Chronic, followed by wound management at this time.  Continue this collaboration and review notes.  Appreciate their input on this more complex case.  Will refill pain medication during this time for 10-325 Norco due to pain with dressing changes and wound.  He is not to take more then ordered and bring pill bottle to next visit.

## 2022-02-19 NOTE — Patient Instructions (Signed)

## 2022-02-19 NOTE — Progress Notes (Signed)
BP (!) 147/80   Pulse 80   Temp 98.1 F (36.7 C) (Oral)   Ht _0  (1.905 m)   Wt 255 lb (115.7 kg)   SpO2 99%   BMI 31.87 kg/m    Subjective:    [name], male    DOB: [birth year], 61 y.o.   MRN: [id number]  HPI: Adam Howell is a 61 y.o. male  Chief Complaint  Patient presents with   Leg Pain    Right lower leg pain. Patient states taht he was given 31m Norco by you and is taking 2 to help with pain. Was given 5 mg Norco on 12/5 and he is taking 3   SKIN WOUND Ongoing wound to right shin, saw wound care last on 02/17/22 and debridement performed.  Had full vascular work-up on admission recently to hospital and this was reassuring.  Currently taking Norco for pain related to wound -- currently taking 5-325 dosing, but feels this is too little has having to take 3 at a time -- 3 times a day.  Had refill on 02/17/22 PDMP.  Continues to need and is aware not for long term use, if long term use needed will obtain UDS and pain contract.   Duration: 12/24/21 Location:  right shin Painful: ongoing Itching: none Onset: sudden Context: not changing Associated signs and symptoms: none History of skin cancer: no History of precancerous skin lesions: no Family history of skin cancer: no   Relevant past medical, surgical, family and social history reviewed and updated as indicated. Interim medical history since our last visit reviewed. Allergies and medications reviewed and updated.  Review of Systems  Constitutional:  Negative for activity change, diaphoresis, fatigue and fever.  Respiratory:  Negative for cough, chest tightness, shortness of breath and wheezing.   Cardiovascular:  Negative for chest pain, palpitations and leg swelling.  Gastrointestinal: Negative.   Endocrine: Negative for polydipsia, polyphagia and polyuria.  Skin:  Positive for wound.  Neurological: Negative.   Psychiatric/Behavioral: Negative.      Per HPI unless specifically  indicated above     Objective:    BP (!) 147/80   Pulse 80   Temp 98.1 F (36.7 C) (Oral)   Ht _1  (1.905 m)   Wt 255 lb (115.7 kg)   SpO2 99%   BMI 31.87 kg/m   Wt Readings from Last 3 Encounters:  02/19/22 255 lb (115.7 kg)  02/03/22 258 lb (117 kg)  02/02/22 256 lb (116.1 kg)    Physical Exam Vitals and nursing note reviewed.  Constitutional:      General: He is awake. He is not in acute distress.    Appearance: He is well-developed and well-groomed. He is obese. He is not ill-appearing or toxic-appearing.  HENT:     Head: Normocephalic and atraumatic.     Right Ear: Hearing normal. No drainage.     Left Ear: Hearing normal. No drainage.  Eyes:     General: Lids are normal.        Right eye: No discharge.        Left eye: No discharge.     Conjunctiva/sclera: Conjunctivae normal.     Pupils: Pupils are equal, round, and reactive to light.  Neck:     Thyroid: No thyromegaly.     Vascular: No carotid bruit.     Trachea: Trachea normal.  Cardiovascular:     Rate and Rhythm: Normal rate and regular rhythm.  Pulses:          Dorsalis pedis pulses are 1+ on the right side and 1+ on the left side.       Posterior tibial pulses are 1+ on the right side and 1+ on the left side.     Heart sounds: Normal heart sounds, S1 normal and S2 normal. No murmur heard.    No gallop.     Arteriovenous access: Left arteriovenous access is present.    Comments: Decreased hair pattern bilateral legs. Pulmonary:     Effort: Pulmonary effort is normal. No accessory muscle usage or respiratory distress.     Breath sounds: Normal breath sounds.  Abdominal:     General: Bowel sounds are normal. There is no distension.     Palpations: Abdomen is soft.     Tenderness: There is no abdominal tenderness.  Musculoskeletal:        General: Normal range of motion.     Cervical back: Normal range of motion and neck supple.     Right lower leg: No edema.     Left lower leg: No edema.   Skin:    General: Skin is warm and dry.     Capillary Refill: Capillary refill takes less than 2 seconds.     Findings: No rash.     Comments: Wound right shin with dressing in place.  Bilateral lower extremities with xerosis.    Neurological:     Mental Status: [name] and oriented to person, place, and time.     Deep Tendon Reflexes: Reflexes are normal and symmetric.  Psychiatric:        Attention and Perception: Attention normal.        Mood and Affect: Mood normal.        Speech: Speech normal.        Behavior: Behavior normal. Behavior is cooperative.        Thought Content: Thought content normal.        Judgment: Judgment normal.    Results for orders placed or performed in visit on 35/00/93  Basic metabolic panel  Result Value Ref Range   Glucose 80 70 - 99 mg/dL   BUN 35 (H) 8 - 27 mg/dL   Creatinine, Ser 9.51 (H) 0.76 - 1.27 mg/dL   eGFR 6 (L) >59 mL/min/1.73   BUN/Creatinine Ratio 4 (L) 10 - 24   Sodium 136 134 - 144 mmol/L   Potassium 5.0 3.5 - 5.2 mmol/L   Chloride 89 (L) 96 - 106 mmol/L   CO2 23 20 - 29 mmol/L   Calcium 8.1 (L) 8.6 - 10.2 mg/dL  CBC with Differential/Platelet  Result Value Ref Range   WBC 4.4 3.4 - 10.8 x10E3/uL   RBC 5.07 4.14 - 5.80 x10E6/uL   Hemoglobin 14.4 13.0 - 17.7 g/dL   Hematocrit 44.0 37.5 - 51.0 %   MCV 87 79 - 97 fL   MCH 28.4 26.6 - 33.0 pg   MCHC 32.7 31.5 - 35.7 g/dL   RDW 14.8 11.6 - 15.4 %   Platelets 200 150 - 450 x10E3/uL   Neutrophils 62 Not Estab. %   Lymphs 18 Not Estab. %   Monocytes 8 Not Estab. %   Eos 10 Not Estab. %   Basos 2 Not Estab. %   Neutrophils Absolute 2.8 1.4 - 7.0 x10E3/uL   Lymphocytes Absolute 0.8 0.7 - 3.1 x10E3/uL   Monocytes Absolute 0.4 0.1 - 0.9 x10E3/uL   EOS (ABSOLUTE) 0.4  0.0 - 0.4 x10E3/uL   Basophils Absolute 0.1 0.0 - 0.2 x10E3/uL   Immature Granulocytes 0 Not Estab. %   Immature Grans (Abs) 0.0 0.0 - 0.1 x10E3/uL      Assessment & Plan:   Problem List Items Addressed  This Visit       Endocrine   Diabetic ulcer of right lower leg (HCC)    Chronic, followed by wound management at this time.  Continue this collaboration and review notes.  Appreciate their input on this more complex case.  Will refill pain medication during this time for 10-325 Norco due to pain with dressing changes and wound.  He is not to take more then ordered and bring pill bottle to next visit.      Relevant Medications   HYDROcodone-acetaminophen (NORCO) 10-325 MG tablet     Other   Chronic pain syndrome - Primary    Ongoing, reports taking Norco only after dialysis due to discomfort + taking for wound at this time.  Controlled substance contract up to date, unable to void due to dialysis, will consider pain management referral if frequent use of Norco.  Will refill pain medication during this time for 10-325 Norco due to pain with dressing changes and wound.  He is not to take more then ordered and bring pill bottle to next visit.      Relevant Medications   HYDROcodone-acetaminophen (NORCO) 10-325 MG tablet     Follow up plan: Return for as scheduled on January 2nd.

## 2022-02-19 NOTE — Assessment & Plan Note (Signed)
Ongoing, reports taking Norco only after dialysis due to discomfort + taking for wound at this time.  Controlled substance contract up to date, unable to void due to dialysis, will consider pain management referral if frequent use of Norco.  Will refill pain medication during this time for 10-325 Norco due to pain with dressing changes and wound.  He is not to take more then ordered and bring pill bottle to next visit.

## 2022-02-20 NOTE — Progress Notes (Addendum)
Adam Howell ([id number]) [id number] Page 1 of 7 Visit Report for 02/17/2022 Arrival Information Details Patient Name: Date of Service: Adam Howell, Adam Howell 02/17/2022 12:45 PM Medical Record Number: [id number] Patient Account Number: [id number] Date of Birth/Sex: Treating RN: 1960/11/14 (61 y.o. Adam Howell Primary Care Barnett Elzey: [name] Other Clinician: Massie Kluver Referring Elloise Roark: Treating Ayanni Tun/Extender: Adam Howell: 2 Visit Information History Since Last Visit All ordered tests and consults were completed: No Patient Arrived: Adam Howell Added or deleted any medications: No Arrival Time: 12:51 Any new allergies or adverse reactions: No Transfer Assistance: None Had a fall or experienced change in No Patient Identification Verified: Yes activities of daily living that may affect Secondary Verification Process Completed: Yes risk of falls: Patient Has Alerts: Yes Signs or symptoms of abuse/neglect since last visito No Patient Alerts: NAME ALERT Hospitalized since last visit: No Implantable device outside of the clinic excluding No cellular tissue based products placed in the center since last visit: Has Dressing in Place as Prescribed: Yes Has Compression in Place as Prescribed: Yes Pain Present Now: Yes Electronic Signature(s) Signed: 02/20/2022 10:21:17 AM By: Massie Kluver Entered By: Massie Kluver on 02/17/2022 12:54:50 -------------------------------------------------------------------------------- Clinic Level of Care Assessment Details Patient Name: Date of Service: Adam Howell, Adam Howell 02/17/2022 12:45 PM Medical Record Number: [id number] Patient Account Number: [id number] Date of Birth/Sex: Treating RN: January 25, 1961 (61 y.o. Adam Howell Primary Care Karington Zarazua: [name] Other Clinician: Massie Kluver Referring Brandey Vandalen: Treating Caron Ode/Extender: Adam Howell: 2 Clinic Level of Care Assessment Items TOOL 1 Quantity Score []  - 0 Use when EandM and Procedure is performed on INITIAL visit ASSESSMENTS - Nursing Assessment / Reassessment []  - 0 General Physical Exam (combine w/ comprehensive assessment (listed just below) when performed on new pt. [address]GABRIAL, Howell ([id number]) [id number] Page 2 of 7 []  - 0 Comprehensive Assessment (HX, ROS, Risk Assessments, Wounds Hx, etc.) ASSESSMENTS - Wound and Skin Assessment / Reassessment []  - 0 Dermatologic / Skin Assessment (not related to wound area) ASSESSMENTS - Ostomy and/or Continence Assessment and Care []  - 0 Incontinence Assessment and Management []  - 0 Ostomy Care Assessment and Management (repouching, etc.) PROCESS - Coordination of Care []  - 0 Simple Patient / Family Education for ongoing care []  - 0 Complex (extensive) Patient / Family Education for ongoing care []  - 0 Staff obtains Programmer, systems, Records, T Results / Process Orders est []  - 0 Staff telephones HHA, Nursing Homes / Clarify orders / etc []  - 0 Routine Transfer to another Facility (non-emergent condition) []  - 0 Routine Hospital Admission (non-emergent condition) []  - 0 New Admissions / Biomedical engineer / Ordering NPWT Apligraf, etc. , []  - 0 Emergency Hospital Admission (emergent condition) PROCESS - Special Needs []  - 0 Pediatric / Minor Patient Management []  - 0 Isolation Patient Management []  - 0 Hearing / Language / Visual special needs []  - 0 Assessment of Community assistance (transportation, D/C planning, etc.) []  - 0 Additional assistance / Altered mentation []  - 0 Support Surface(s) Assessment (bed, cushion, seat, etc.) INTERVENTIONS - Miscellaneous []  - 0 External ear exam []  - 0 Patient Transfer (multiple staff / Civil Service fast streamer / Similar devices) []  - 0 Simple Staple / Suture removal (25 or less) []  - 0 Complex Staple / Suture  removal (26 or more) []  - 0 Hypo/Hyperglycemic Management (do not check if billed separately) []  - 0 Ankle / Brachial Index (ABI) - do not check if  billed separately Has the patient been seen at the hospital within the last three years: Yes Total Score: 0 Level Of Care: ____ Electronic Signature(s) Signed: 02/20/2022 10:21:17 AM By: Massie Kluver Entered By: Massie Kluver on 02/17/2022 13:35:27 -------------------------------------------------------------------------------- Lower Extremity Assessment Details Patient Name: Date of Service: Adam Howell, Adam Howell 02/17/2022 12:45 PM Medical Record Number: [id number] Patient Account Number: [id number] Date of Birth/Sex: Treating RN: 01-Oct-1960 (61 y.o. Adam Howell Primary Care Denis Carreon: [name] Other Clinician: [name], [name] ([id number]) [id number] Page 3 of 7 Referring Aubreyana Saltz: Treating Mayling Aber/Extender: Adam Howell: 2 Edema Assessment Assessed: [Left: No] [Right: Yes] [Left: Edema] [Right: :] Calf Left: Right: Point of Measurement: 37 cm From Medial Instep 35.8 cm Ankle Left: Right: Point of Measurement: 13 cm From Medial Instep 27 cm Vascular Assessment Pulses: Dorsalis Pedis Palpable: [Right:Yes] Electronic Signature(s) Signed: 02/17/2022 5:15:20 PM By: Gretta Cool, BSN, RN, CWS, Kim RN, BSN Signed: 02/20/2022 10:21:17 AM By: Massie Kluver Entered By: Massie Kluver on 02/17/2022 13:11:28 -------------------------------------------------------------------------------- Multi Wound Chart Details Patient Name: Date of Service: Adam Howell 02/17/2022 12:45 PM Medical Record Number: [id number] Patient Account Number: [id number] Date of Birth/Sex: Treating RN: 1961/01/12 (61 y.o. Adam Howell Primary Care Dodi Leu: [name] Other Clinician: Massie Kluver Referring Miley Blanchett: Treating Harrison Zetina/Extender: Adam Howell: 2 Vital Signs Height(in): Pulse(bpm): 89 Weight(lbs): Blood Pressure(mmHg): 179/92 Body Mass Index(BMI): Temperature(F): 98.2 Respiratory Rate(breaths/min): 18 [2:Photos:] [N/A:N/A] Right, Midline Lower Leg N/A N/A Wound Location: Gradually Appeared N/A N/A Wounding Event: Diabetic Wound/Ulcer of the Lower N/A N/A Primary Etiology: Extremity Congestive Heart Failure, N/A N/A Comorbid HistoryKAIMANA, LURZ ([id number]) [id number] Page 4 of 7 Hypertension, Type II Diabetes, End Stage Renal Disease, Neuropathy 11/03/2021 N/A N/A Date Acquired: 2 N/A N/A Weeks of Howell: Open N/A N/A Wound Status: No N/A N/A Wound Recurrence: 13x9.7x0.3 N/A N/A Measurements L x W x D (cm) 99.039 N/A N/A A (cm) : rea 29.712 N/A N/A Volume (cm) : -48.40% N/A N/A % Reduction in A rea: -122.50% N/A N/A % Reduction in Volume: Grade 1 N/A N/A Classification: Medium N/A N/A Exudate A mount: Sanguinous N/A N/A Exudate Type: red N/A N/A Exudate Color: Flat and Intact N/A N/A Wound Margin: Small (1-33%) N/A N/A Granulation A mount: Large (67-100%) N/A N/A Necrotic A mount: Fat Layer (Subcutaneous Tissue): Yes N/A N/A Exposed Structures: Fascia: No Tendon: No Muscle: No Joint: No Bone: No Small (1-33%) N/A N/A Epithelialization: Howell Notes Electronic Signature(s) Signed: 02/20/2022 10:21:17 AM By: Massie Kluver Entered By: Massie Kluver on 02/17/2022 13:11:39 -------------------------------------------------------------------------------- Multi-Disciplinary Care Plan Details Patient Name: Date of Service: Adam Howell 02/17/2022 12:45 PM Medical Record Number: [id number] Patient Account Number: [id number] Date of Birth/Sex: Treating RN: Oct 24, 1960 (61 y.o. Adam Howell Primary Care Editha Bridgeforth: [name] Other Clinician: Massie Kluver Referring Shyheim Tanney: Treating Alazne Quant/Extender: Adam Howell: 2 Active Inactive Electronic Signature(s) Signed: 03/23/2022 12:46:25 PM By: Gretta Cool, BSN, RN, CWS, Kim RN, BSN Entered By: Gretta Cool, BSN, RN, CWS, Kim on 03/23/2022 12:46:25 Adam Howell, Adam Howell ([id number]) [id number] Page 5 of 7 -------------------------------------------------------------------------------- Pain Assessment Details Patient Name: Date of Service: Adam Howell, Adam Howell 02/17/2022 12:45 PM Medical Record Number: [id number] Patient Account Number: [id number] Date of Birth/Sex: Treating RN: [birth year] (61 y.o. Adam Howell Primary Care Heike Pounds: [name] Other Clinician: Massie Kluver Referring Shamari Adam Howell: Treating Venus Ruhe/Extender: Adam Howell: 2 Active Problems Location of Pain  Severity and Description of Pain Patient Has Paino Yes Site Locations Pain Location: Pain in Ulcers Duration of the Pain. Constant / Intermittento Constant Rate the pain. Current Pain Level: 7 Worst Pain Level: 10 Least Pain Level: 3 Tolerable Pain Level: 3 Character of Pain Describe the Pain: Throbbing Pain Management and Medication Current Pain Management: Medication: Yes Cold Application: No Rest: No Massage: No Activity: No T.E.N.S.: No Heat Application: No Leg drop or elevation: No Is the Current Pain Management Adequate: Inadequate How does your wound impact your activities of daily livingo Sleep: No Bathing: No Appetite: No Relationship With Others: No Bladder Continence: No Emotions: No Bowel Continence: No Work: No Toileting: No Drive: No Dressing: No Hobbies: No Engineer, maintenance) Signed: 02/17/2022 5:15:20 PM By: Gretta Cool, BSN, RN, CWS, Kim RN, BSN Signed: 02/20/2022 10:21:17 AM By: Massie Kluver Entered By: Massie Kluver on 02/17/2022 12:58:39 -------------------------------------------------------------------------------- Patient/Caregiver Education  Details Patient Name: Date of Service: Adam Howell 12/5/2023andnbsp12:45 PM Medical Record Number: [id number] Patient Account Number: [id number] Adam Howell, Adam Howell ([id number]) [id number] Page 6 of 7 Date of Birth/Gender: Treating RN: October 18, 1960 (61 y.o. Adam Howell Primary Care Physician: [name] Other Clinician: Massie Kluver Referring Physician: Treating Physician/Extender: Adam Howell: 2 Education Assessment Education Provided To: Patient Education Topics Provided Wound/Skin Impairment: Handouts: Other: continue wound care as directed Methods: Explain/Verbal Responses: State content correctly Electronic Signature(s) Signed: 02/20/2022 10:21:17 AM By: Massie Kluver Entered By: Massie Kluver on 02/17/2022 14:31:23 -------------------------------------------------------------------------------- Wound Assessment Details Patient Name: Date of Service: Adam Howell 02/17/2022 12:45 PM Medical Record Number: [id number] Patient Account Number: [id number] Date of Birth/Sex: Treating RN: [birth year] (61 y.o. Isac Sarna, Maudie Mercury Primary Care Cleto Claggett: [name] Other Clinician: Massie Kluver Referring Cristofher Livecchi: Treating Cataleyah Colborn/Extender: Adam Howell: 2 Wound Status Wound Number: 2 Primary Diabetic Wound/Ulcer of the Lower Extremity Etiology: Wound Location: Right, Midline Lower Leg Wound Open Wounding Event: Gradually Appeared Status: Date Acquired: 11/03/2021 Comorbid Congestive Heart Failure, Hypertension, Type II Diabetes, End Weeks Of Howell: 2 History: Stage Renal Disease, Neuropathy Clustered Wound: No Photos Wound Measurements Length: (cm) 13 Width: (cm) 9.7 Depth: (cm) 0.3 Area: (cm) 99.039 Volume: (cm) 29.712 Adam Howell, Adam Howell ([id number]) Wound Description Classification: Grade 1 Wound Margin: Flat and Intact Exudate Amount:  Medium Exudate Type: Sanguinous Exudate Color: red Foul Odor After Cleansing: No Slough/Fibrino Yes % Reduction in Area: -48.4% % Reduction in Volume: -122.5% Epithelialization: Small (1-33%) Tunneling: No Undermining: No [id number] Page 7 of 7 Wound Bed Granulation Amount: Small (1-33%) Exposed Structure Necrotic Amount: Large (67-100%) Fascia Exposed: No Fat Layer (Subcutaneous Tissue) Exposed: Yes Tendon Exposed: No Muscle Exposed: No Joint Exposed: No Bone Exposed: No Electronic Signature(s) Signed: 02/17/2022 5:15:20 PM By: Gretta Cool, BSN, RN, CWS, Kim RN, BSN Signed: 02/20/2022 10:21:17 AM By: Massie Kluver Entered By: Massie Kluver on 02/17/2022 13:10:01 -------------------------------------------------------------------------------- Ferdinand Details Patient Name: Date of Service: Adam Howell 02/17/2022 12:45 PM Medical Record Number: [id number] Patient Account Number: [id number] Date of Birth/Sex: Treating RN: 06-27-1960 (61 y.o. Adam Howell Primary Care Yigit Norkus: [name] Other Clinician: Massie Kluver Referring Halei Hanover: Treating Juliya Magill/Extender: Adam Howell: 2 Vital Signs Time Taken: 12:56 Temperature (F): 98.2 Pulse (bpm): 89 Respiratory Rate (breaths/min): 18 Blood Pressure (mmHg): 179/92 Reference Range: 80 - 120 mg / dl Electronic Signature(s) Signed: 02/20/2022 10:21:17 AM By: Massie Kluver Entered By: Massie Kluver on 02/17/2022 12:58:28

## 2022-02-26 ENCOUNTER — Telehealth: Payer: Self-pay

## 2022-02-26 NOTE — Telephone Encounter (Signed)
Paperwork faxed back to [facility]  

## 2022-03-03 ENCOUNTER — Ambulatory Visit: Payer: Medicare Other | Admitting: Physician Assistant

## 2022-03-05 ENCOUNTER — Emergency Department: Payer: Medicare Other

## 2022-03-05 ENCOUNTER — Inpatient Hospital Stay
Admission: EM | Admit: 2022-03-05 | Discharge: 2022-03-19 | DRG: 463 | Disposition: A | Discharge status: Rehabilitation Facility | Payer: Medicare Other | Attending: Obstetrics and Gynecology | Admitting: Obstetrics and Gynecology

## 2022-03-05 ENCOUNTER — Encounter: Payer: Self-pay | Admitting: Emergency Medicine

## 2022-03-05 ENCOUNTER — Other Ambulatory Visit: Payer: Self-pay

## 2022-03-05 DIAGNOSIS — E669 Obesity, unspecified: Secondary | ICD-10-CM | POA: Diagnosis present

## 2022-03-05 DIAGNOSIS — G4733 Obstructive sleep apnea (adult) (pediatric): Secondary | ICD-10-CM | POA: Diagnosis present

## 2022-03-05 DIAGNOSIS — I482 Chronic atrial fibrillation, unspecified: Secondary | ICD-10-CM | POA: Diagnosis present

## 2022-03-05 DIAGNOSIS — Z794 Long term (current) use of insulin: Secondary | ICD-10-CM | POA: Diagnosis not present

## 2022-03-05 DIAGNOSIS — E1140 Type 2 diabetes mellitus with diabetic neuropathy, unspecified: Secondary | ICD-10-CM | POA: Diagnosis present

## 2022-03-05 DIAGNOSIS — E1169 Type 2 diabetes mellitus with other specified complication: Secondary | ICD-10-CM | POA: Diagnosis present

## 2022-03-05 DIAGNOSIS — S78111A Complete traumatic amputation at level between right hip and knee, initial encounter: Secondary | ICD-10-CM | POA: Diagnosis not present

## 2022-03-05 DIAGNOSIS — E876 Hypokalemia: Secondary | ICD-10-CM | POA: Diagnosis present

## 2022-03-05 DIAGNOSIS — Z7901 Long term (current) use of anticoagulants: Secondary | ICD-10-CM

## 2022-03-05 DIAGNOSIS — M726 Necrotizing fasciitis: Secondary | ICD-10-CM | POA: Diagnosis present

## 2022-03-05 DIAGNOSIS — S81801A Unspecified open wound, right lower leg, initial encounter: Secondary | ICD-10-CM | POA: Diagnosis present

## 2022-03-05 DIAGNOSIS — L97818 Non-pressure chronic ulcer of other part of right lower leg with other specified severity: Secondary | ICD-10-CM | POA: Diagnosis present

## 2022-03-05 DIAGNOSIS — D631 Anemia in chronic kidney disease: Secondary | ICD-10-CM | POA: Diagnosis not present

## 2022-03-05 DIAGNOSIS — L97513 Non-pressure chronic ulcer of other part of right foot with necrosis of muscle: Secondary | ICD-10-CM | POA: Diagnosis present

## 2022-03-05 DIAGNOSIS — N186 End stage renal disease: Secondary | ICD-10-CM

## 2022-03-05 DIAGNOSIS — E785 Hyperlipidemia, unspecified: Secondary | ICD-10-CM | POA: Diagnosis present

## 2022-03-05 DIAGNOSIS — I9581 Postprocedural hypotension: Secondary | ICD-10-CM | POA: Diagnosis not present

## 2022-03-05 DIAGNOSIS — E1122 Type 2 diabetes mellitus with diabetic chronic kidney disease: Secondary | ICD-10-CM | POA: Diagnosis present

## 2022-03-05 DIAGNOSIS — I132 Hypertensive heart and chronic kidney disease with heart failure and with stage 5 chronic kidney disease, or end stage renal disease: Secondary | ICD-10-CM | POA: Diagnosis present

## 2022-03-05 DIAGNOSIS — B9689 Other specified bacterial agents as the cause of diseases classified elsewhere: Secondary | ICD-10-CM | POA: Diagnosis present

## 2022-03-05 DIAGNOSIS — R0902 Hypoxemia: Secondary | ICD-10-CM | POA: Diagnosis not present

## 2022-03-05 DIAGNOSIS — I739 Peripheral vascular disease, unspecified: Secondary | ICD-10-CM | POA: Diagnosis not present

## 2022-03-05 DIAGNOSIS — I5022 Chronic systolic (congestive) heart failure: Secondary | ICD-10-CM | POA: Diagnosis present

## 2022-03-05 DIAGNOSIS — E871 Hypo-osmolality and hyponatremia: Secondary | ICD-10-CM | POA: Diagnosis present

## 2022-03-05 DIAGNOSIS — Z9989 Dependence on other enabling machines and devices: Secondary | ICD-10-CM

## 2022-03-05 DIAGNOSIS — E11649 Type 2 diabetes mellitus with hypoglycemia without coma: Secondary | ICD-10-CM | POA: Diagnosis not present

## 2022-03-05 DIAGNOSIS — I5042 Chronic combined systolic (congestive) and diastolic (congestive) heart failure: Secondary | ICD-10-CM | POA: Diagnosis present

## 2022-03-05 DIAGNOSIS — T148XXA Other injury of unspecified body region, initial encounter: Secondary | ICD-10-CM | POA: Diagnosis not present

## 2022-03-05 DIAGNOSIS — R569 Unspecified convulsions: Secondary | ICD-10-CM | POA: Diagnosis not present

## 2022-03-05 DIAGNOSIS — E1165 Type 2 diabetes mellitus with hyperglycemia: Secondary | ICD-10-CM | POA: Diagnosis present

## 2022-03-05 DIAGNOSIS — R34 Anuria and oliguria: Secondary | ICD-10-CM | POA: Diagnosis present

## 2022-03-05 DIAGNOSIS — G9341 Metabolic encephalopathy: Secondary | ICD-10-CM | POA: Diagnosis not present

## 2022-03-05 DIAGNOSIS — D649 Anemia, unspecified: Secondary | ICD-10-CM | POA: Diagnosis not present

## 2022-03-05 DIAGNOSIS — G253 Myoclonus: Secondary | ICD-10-CM | POA: Diagnosis not present

## 2022-03-05 DIAGNOSIS — Z992 Dependence on renal dialysis: Secondary | ICD-10-CM | POA: Diagnosis not present

## 2022-03-05 DIAGNOSIS — L089 Local infection of the skin and subcutaneous tissue, unspecified: Secondary | ICD-10-CM | POA: Diagnosis present

## 2022-03-05 DIAGNOSIS — G928 Other toxic encephalopathy: Secondary | ICD-10-CM | POA: Diagnosis present

## 2022-03-05 DIAGNOSIS — N2581 Secondary hyperparathyroidism of renal origin: Secondary | ICD-10-CM | POA: Diagnosis not present

## 2022-03-05 DIAGNOSIS — Z79899 Other long term (current) drug therapy: Secondary | ICD-10-CM

## 2022-03-05 DIAGNOSIS — M60061 Infective myositis, right lower leg: Secondary | ICD-10-CM | POA: Diagnosis present

## 2022-03-05 DIAGNOSIS — Z885 Allergy status to narcotic agent status: Secondary | ICD-10-CM

## 2022-03-05 DIAGNOSIS — Z6831 Body mass index (BMI) 31.0-31.9, adult: Secondary | ICD-10-CM

## 2022-03-05 DIAGNOSIS — B964 Proteus (mirabilis) (morganii) as the cause of diseases classified elsewhere: Secondary | ICD-10-CM | POA: Diagnosis present

## 2022-03-05 DIAGNOSIS — I959 Hypotension, unspecified: Secondary | ICD-10-CM | POA: Insufficient documentation

## 2022-03-05 DIAGNOSIS — R197 Diarrhea, unspecified: Secondary | ICD-10-CM | POA: Diagnosis not present

## 2022-03-05 DIAGNOSIS — E1151 Type 2 diabetes mellitus with diabetic peripheral angiopathy without gangrene: Secondary | ICD-10-CM | POA: Diagnosis present

## 2022-03-05 DIAGNOSIS — I4891 Unspecified atrial fibrillation: Secondary | ICD-10-CM | POA: Diagnosis present

## 2022-03-05 DIAGNOSIS — R401 Stupor: Secondary | ICD-10-CM | POA: Diagnosis not present

## 2022-03-05 DIAGNOSIS — R4182 Altered mental status, unspecified: Secondary | ICD-10-CM | POA: Diagnosis not present

## 2022-03-05 DIAGNOSIS — R269 Unspecified abnormalities of gait and mobility: Secondary | ICD-10-CM | POA: Diagnosis present

## 2022-03-05 DIAGNOSIS — Z8249 Family history of ischemic heart disease and other diseases of the circulatory system: Secondary | ICD-10-CM

## 2022-03-05 HISTORY — DX: Failed or difficult intubation, initial encounter: T88.4XXA

## 2022-03-05 LAB — CBC WITH DIFFERENTIAL/PLATELET
Abs Immature Granulocytes: 0.33 10*3/uL — ABNORMAL HIGH (ref 0.00–0.07)
Basophils Absolute: 0.1 10*3/uL (ref 0.0–0.1)
Basophils Relative: 1 %
Eosinophils Absolute: 0.2 10*3/uL (ref 0.0–0.5)
Eosinophils Relative: 3 %
HCT: 40.5 % (ref 39.0–52.0)
Hemoglobin: 12.9 g/dL — ABNORMAL LOW (ref 13.0–17.0)
Immature Granulocytes: 4 %
Lymphocytes Relative: 9 %
Lymphs Abs: 0.8 10*3/uL (ref 0.7–4.0)
MCH: 27.9 pg (ref 26.0–34.0)
MCHC: 31.9 g/dL (ref 30.0–36.0)
MCV: 87.5 fL (ref 80.0–100.0)
Monocytes Absolute: 0.7 10*3/uL (ref 0.1–1.0)
Monocytes Relative: 7 %
Neutro Abs: 7 10*3/uL (ref 1.7–7.7)
Neutrophils Relative %: 76 %
Platelets: 350 10*3/uL (ref 150–400)
RBC: 4.63 MIL/uL (ref 4.22–5.81)
RDW: 17.2 % — ABNORMAL HIGH (ref 11.5–15.5)
WBC: 9.1 10*3/uL (ref 4.0–10.5)
nRBC: 0 % (ref 0.0–0.2)

## 2022-03-05 LAB — COMPREHENSIVE METABOLIC PANEL
ALT: 10 U/L (ref 0–44)
AST: 16 U/L (ref 15–41)
Albumin: 3 g/dL — ABNORMAL LOW (ref 3.5–5.0)
Alkaline Phosphatase: 85 U/L (ref 38–126)
Anion gap: 14 (ref 5–15)
BUN: 26 mg/dL — ABNORMAL HIGH (ref 6–20)
CO2: 30 mmol/L (ref 22–32)
Calcium: 8.8 mg/dL — ABNORMAL LOW (ref 8.9–10.3)
Chloride: 89 mmol/L — ABNORMAL LOW (ref 98–111)
Creatinine, Ser: 7.14 mg/dL — ABNORMAL HIGH (ref 0.61–1.24)
GFR, Estimated: 8 mL/min — ABNORMAL LOW (ref >60)
Glucose, Bld: 102 mg/dL — ABNORMAL HIGH (ref 70–99)
Potassium: 3.2 mmol/L — ABNORMAL LOW (ref 3.5–5.1)
Sodium: 133 mmol/L — ABNORMAL LOW (ref 135–145)
Total Bilirubin: 1.6 mg/dL — ABNORMAL HIGH (ref 0.3–1.2)
Total Protein: 8.5 g/dL — ABNORMAL HIGH (ref 6.5–8.1)

## 2022-03-05 LAB — LACTIC ACID, PLASMA
Lactic Acid, Venous: 1.3 mmol/L (ref 0.5–1.9)
Lactic Acid, Venous: 1.3 mmol/L (ref 0.5–1.9)

## 2022-03-05 LAB — PROTIME-INR
INR: 1.2 (ref 0.8–1.2)
Prothrombin Time: 15.1 seconds (ref 11.4–15.2)

## 2022-03-05 MED ORDER — VANCOMYCIN HCL 1500 MG/300ML IV SOLN
1500.0000 mg | Freq: Once | INTRAVENOUS | Status: DC
  Filled 2022-03-05: qty 300

## 2022-03-05 MED ORDER — VANCOMYCIN HCL 1500 MG/300ML IV SOLN: 1500.0000 mg | Freq: Once | INTRAVENOUS | Status: DC

## 2022-03-05 MED ORDER — MORPHINE SULFATE (PF) 4 MG/ML IV SOLN
4.0000 mg | Freq: Once | INTRAVENOUS | Status: AC
  Administered 2022-03-05: 4 mg via INTRAVENOUS
  Filled 2022-03-05: qty 1

## 2022-03-05 MED ORDER — VANCOMYCIN HCL 1500 MG/300ML IV SOLN
1500.0000 mg | Freq: Once | INTRAVENOUS | Status: AC
  Administered 2022-03-05: 1500 mg via INTRAVENOUS
  Filled 2022-03-05: qty 300

## 2022-03-05 MED ORDER — OXYCODONE-ACETAMINOPHEN 5-325 MG PO TABS: 1.0000 | ORAL_TABLET | Freq: Once | ORAL | Status: DC

## 2022-03-05 MED ORDER — VANCOMYCIN HCL IN DEXTROSE 1-5 GM/200ML-% IV SOLN
1000.0000 mg | Freq: Once | INTRAVENOUS | Status: DC
  Filled 2022-03-05: qty 200

## 2022-03-05 MED ORDER — PIPERACILLIN-TAZOBACTAM 3.375 G IVPB 30 MIN
3.3750 g | Freq: Once | INTRAVENOUS | Status: AC
  Administered 2022-03-05: 3.375 g via INTRAVENOUS
  Filled 2022-03-05: qty 50

## 2022-03-05 MED ORDER — ONDANSETRON HCL 4 MG/2ML IJ SOLN
4.0000 mg | Freq: Once | INTRAMUSCULAR | Status: AC
  Administered 2022-03-05: 4 mg via INTRAVENOUS
  Filled 2022-03-05: qty 2

## 2022-03-05 NOTE — ED Triage Notes (Signed)
Patient states his home health nurse was changing his dressing today and states there was exposed bone. Patient states nurse had changed his dressing on Tuesday and it was not like this. Patient is dialysis pt with last treatment yesterday.

## 2022-03-05 NOTE — ED Triage Notes (Signed)
Ems report: pt ems from home for possible osteomyelitis. Pt with hx RLE wound that is being treated by home health wound care. Home health worker was concerned that the bone was exposed. VS WNL.

## 2022-03-05 NOTE — ED Provider Notes (Signed)
-----------------------------------------   11:24 PM on 03/05/2022 -----------------------------------------  Awaiting MRI read.  Plan is for admission.  I have signed out the patient to the oncoming ED physician Dr. Tamala Julian.    Arta Silence, MD 03/05/22 [id number]

## 2022-03-05 NOTE — ED Provider Notes (Signed)
Patient signed out to me pending MRI by Dr. Cherylann Banas in the setting of an obvious large anterior lower extremity wound.  This MRI with evidence of myositis but no fluid collection such as abscess.  He has received broad-spectrum antibiotics.  Will consult medicine for admission.   Vladimir Crofts, MD 03/05/22 [phone]

## 2022-03-05 NOTE — ED Provider Notes (Signed)
[facility] Emergency Department Provider Note     Event Date/Time   First MD Initiated Contact with Patient 03/05/22 1712     (approximate)   History   Wound Check   HPI  Adam Howell is a 61 y.o. male with a history of CKD on dialysis MWF, anemia of chronic disease, type 2 diabetes, CHF, A-fib, and a chronic RLE ulcer, presents to the ED for evaluation.  Patient reports that his home health nurse who comes to the house twice weekly for wound care, was concerned because of the significant progression of the patient's wound since last week.  She last changed her dressing today, and noted since Tuesday, the wound appears to show exposed bone.  Patient denies any fevers, chills, sweats.  Would endorse that the wound has been weeping between dressing changes.  Physical Exam   Triage Vital Signs: ED Triage Vitals  Enc Vitals Group     BP 03/05/22 1311 115/76     Pulse Rate 03/05/22 1311 96     Resp 03/05/22 1311 18     Temp 03/05/22 1311 98.2 F (36.8 C)     Temp Source 03/05/22 1311 Oral     SpO2 03/05/22 1311 99 %     Weight 03/05/22 1312 255 lb (115.7 kg)     Height 03/05/22 1312 6\' 3"  (1.905 m)     Head Circumference --      Peak Flow --      Pain Score 03/05/22 1311 10     Pain Loc --      Pain Edu? --      Excl. in [place]? --     Most recent vital signs: Vitals:   03/05/22 1311 03/05/22 1719  BP: 115/76 118/80  Pulse: 96 88  Resp: 18 18  Temp: 98.2 F (36.8 C) 98 F (36.7 C)  SpO2: 99% 99%    General Awake, no distress. NAD CV:  Good peripheral perfusion. RLE with edema and hyperpigmentation consistent with vascular disease. Large anterior RLE wound with exposed muscle, tendon, and granulation noted. Some eschar noted with dressing removal.  RESP:  Normal effort.  ABD:  No distention.     ED Results / Procedures / Treatments   Labs (all labs ordered are listed, but only abnormal results are displayed) Labs Reviewed   COMPREHENSIVE METABOLIC PANEL - Abnormal; Notable for the following components:      Result Value   Sodium 133 (*)    Potassium 3.2 (*)    Chloride 89 (*)    Glucose, Bld 102 (*)    BUN 26 (*)    Creatinine, Ser 7.14 (*)    Calcium 8.8 (*)    Total Protein 8.5 (*)    Albumin 3.0 (*)    Total Bilirubin 1.6 (*)    GFR, Estimated 8 (*)    All other components within normal limits  CBC WITH DIFFERENTIAL/PLATELET - Abnormal; Notable for the following components:   Hemoglobin 12.9 (*)    RDW 17.2 (*)    Abs Immature Granulocytes 0.33 (*)    All other components within normal limits  CULTURE, BLOOD (ROUTINE X 2)  CULTURE, BLOOD (ROUTINE X 2)  LACTIC ACID, PLASMA  LACTIC ACID, PLASMA  PROTIME-INR     EKG   RADIOLOGY  ED Provider Interpretation:  No results found.   PROCEDURES:  Critical Care performed: No  Procedures   MEDICATIONS ORDERED IN ED: Medications  morphine (PF) 4 MG/ML  injection 4 mg (has no administration in time range)  ondansetron (ZOFRAN) injection 4 mg (has no administration in time range)  piperacillin-tazobactam (ZOSYN) IVPB 3.375 g (has no administration in time range)  vancomycin (VANCOCIN) IVPB 1000 mg/200 mL premix (has no administration in time range)    Followed by  vancomycin (VANCOREADY) IVPB 1500 mg/300 mL (has no administration in time range)     IMPRESSION / MDM / ASSESSMENT AND PLAN / ED COURSE  I reviewed the triage vital signs and the nursing notes.                              Differential diagnosis includes, but is not limited to, cellulitis, necrotizing fasciitis, osteomyelitis, pyoderma gangrenosum pyomyositis, gas gangrene  Patient's presentation is most consistent with acute presentation with potential threat to life or bodily function.  Patient's diagnosis is consistent with necrotizing fasciitis, based on clinical presentation. Patient will be mated to the hospitalist service for IV antibiotics and possible surgical  intervention. Patient care is transferred to my attending, Dr. Cherylann Banas while awaiting MRI results and admission.   Patient is is understanding of the diagnosis, the potential for life and limb-threatening progression.  He is agreeable to the plan for admission at this time.  Questions have been encouraged.  FINAL CLINICAL IMPRESSION(S) / ED DIAGNOSES   Final diagnoses:  Necrotizing fasciitis of lower leg ([place])     Rx / DC Orders   ED Discharge Orders     None        Note:  This document was prepared using Dragon voice recognition software and may include unintentional dictation errors.    Melvenia Needles, PA-C 03/05/22 1856    Arta Silence, MD 03/05/22 [phone]

## 2022-03-05 NOTE — ED Notes (Signed)
Pt to MRI

## 2022-03-05 NOTE — ED Provider Triage Note (Signed)
Emergency Medicine Provider Triage Evaluation Note  Krishay Faro, a 61 y.o. male  was evaluated in triage.  Pt complains of RLE chronic wound.  Patient presents to the ED at the advice of his home health nurse, for concern for progressive chronic anterior shin wound.  The patient normally dialyzes on M WF, and the nurse comes to the for twice weekly wound care.  She advised ED evaluation for concern for possible exposed bone bed in the wound.  Patient denies any fevers, chills, sweats.  Does endorse some increased drainage from the wound.  Review of Systems  Positive: RLE progressive wound Negative: FCS  Physical Exam  BP 115/76   Pulse 96   Temp 98.2 F (36.8 C) (Oral)   Resp 18   Ht 6\' 3"  (1.905 m)   Wt 115.7 kg   SpO2 99%   BMI 31.87 kg/m  Gen:   Awake, no distress  NAD Resp:  Normal effort CTA MSK:   Moves extremities without difficulty Dressing in place to RLE Other:    Medical Decision Making  Medically screening exam initiated at 1:23 PM.  Appropriate orders placed.  Adham Johnson was informed that the remainder of the evaluation will be completed by another provider, this initial triage assessment does not replace that evaluation, and the importance of remaining in the ED until their evaluation is complete.  Patient to the ED for evaluation of worsening of a chronic RLE wound.   Melvenia Needles, PA-C 03/05/22 1325

## 2022-03-05 NOTE — ED Notes (Signed)
IV Nurse in with patient.

## 2022-03-05 NOTE — ED Notes (Signed)
This RN attempted to start IV, was unsuccessful x2. Vaughan Basta RN attempted also, was unsuccessful. Will try to get [place] IV.

## 2022-03-05 NOTE — ED Notes (Signed)
Patient to MRI.

## 2022-03-06 ENCOUNTER — Encounter: Payer: Self-pay | Admitting: Internal Medicine

## 2022-03-06 DIAGNOSIS — L089 Local infection of the skin and subcutaneous tissue, unspecified: Secondary | ICD-10-CM | POA: Diagnosis not present

## 2022-03-06 DIAGNOSIS — N186 End stage renal disease: Secondary | ICD-10-CM

## 2022-03-06 DIAGNOSIS — Z992 Dependence on renal dialysis: Secondary | ICD-10-CM

## 2022-03-06 DIAGNOSIS — T148XXA Other injury of unspecified body region, initial encounter: Secondary | ICD-10-CM

## 2022-03-06 DIAGNOSIS — S81801A Unspecified open wound, right lower leg, initial encounter: Secondary | ICD-10-CM

## 2022-03-06 DIAGNOSIS — I5022 Chronic systolic (congestive) heart failure: Secondary | ICD-10-CM | POA: Diagnosis present

## 2022-03-06 LAB — HEPATITIS B SURFACE ANTIGEN: Hepatitis B Surface Ag: NONREACTIVE

## 2022-03-06 LAB — CBG MONITORING, ED
Glucose-Capillary: 76 mg/dL (ref 70–99)
Glucose-Capillary: 112 mg/dL — ABNORMAL HIGH (ref 70–99)
Glucose-Capillary: 123 mg/dL — ABNORMAL HIGH (ref 70–99)
Glucose-Capillary: 93 mg/dL (ref 70–99)

## 2022-03-06 LAB — C-REACTIVE PROTEIN: CRP: 25.4 mg/dL — ABNORMAL HIGH (ref <1.0)

## 2022-03-06 LAB — HEMOGLOBIN A1C
Hgb A1c MFr Bld: 6.1 % — ABNORMAL HIGH (ref 4.8–5.6)
Mean Plasma Glucose: 128 mg/dL

## 2022-03-06 LAB — SEDIMENTATION RATE: Sed Rate: 68 mm/hr — ABNORMAL HIGH (ref 0–20)

## 2022-03-06 LAB — PREALBUMIN: Prealbumin: 8 mg/dL — ABNORMAL LOW (ref 18–38)

## 2022-03-06 MED ORDER — ALTEPLASE 2 MG IJ SOLR: 2.0000 mg | Freq: Once | INTRAMUSCULAR | Status: DC | PRN

## 2022-03-06 MED ORDER — HYDROMORPHONE HCL 1 MG/ML IJ SOLN
1.0000 mg | INTRAMUSCULAR | Status: DC | PRN
  Administered 2022-03-06 – 2022-03-07 (×3): 1 mg via INTRAVENOUS
  Filled 2022-03-06 (×3): qty 1

## 2022-03-06 MED ORDER — DIPHENHYDRAMINE HCL 50 MG/ML IJ SOLN: 50.0000 mg | Freq: Once | INTRAMUSCULAR | Status: DC | PRN

## 2022-03-06 MED ORDER — FAMOTIDINE 20 MG PO TABS: 40.0000 mg | ORAL_TABLET | Freq: Once | ORAL | Status: DC | PRN

## 2022-03-06 MED ORDER — ROSUVASTATIN CALCIUM 10 MG PO TABS
40.0000 mg | ORAL_TABLET | Freq: Every day | ORAL | Status: DC
  Administered 2022-03-06 – 2022-03-16 (×7): 40 mg via ORAL
  Filled 2022-03-06 (×5): qty 4
  Filled 2022-03-06: qty 2
  Filled 2022-03-06: qty 4

## 2022-03-06 MED ORDER — ISOSORBIDE MONONITRATE ER 30 MG PO TB24
30.0000 mg | ORAL_TABLET | Freq: Every day | ORAL | Status: DC
  Administered 2022-03-06 – 2022-03-09 (×3): 30 mg via ORAL
  Filled 2022-03-06 (×3): qty 1

## 2022-03-06 MED ORDER — HYDROMORPHONE HCL 1 MG/ML IJ SOLN: 1.0000 mg | Freq: Once | INTRAMUSCULAR | Status: DC | PRN

## 2022-03-06 MED ORDER — TIZANIDINE HCL 2 MG PO TABS: 2.0000 mg | ORAL_TABLET | Freq: Every day | ORAL | Status: DC | PRN

## 2022-03-06 MED ORDER — ACETAMINOPHEN 325 MG RE SUPP: 650.0000 mg | Freq: Four times a day (QID) | RECTAL | Status: DC | PRN

## 2022-03-06 MED ORDER — MIDAZOLAM HCL 2 MG/ML PO SYRP
8.0000 mg | ORAL_SOLUTION | Freq: Once | ORAL | Status: DC | PRN
  Filled 2022-03-06: qty 5

## 2022-03-06 MED ORDER — SERTRALINE HCL 50 MG PO TABS
50.0000 mg | ORAL_TABLET | Freq: Every day | ORAL | Status: DC
  Administered 2022-03-06 – 2022-03-09 (×4): 50 mg via ORAL
  Filled 2022-03-06 (×4): qty 1

## 2022-03-06 MED ORDER — INSULIN ASPART 100 UNIT/ML IJ SOLN
0.0000 [IU] | Freq: Three times a day (TID) | INTRAMUSCULAR | Status: DC
  Filled 2022-03-06 (×2): qty 1

## 2022-03-06 MED ORDER — AMIODARONE HCL 200 MG PO TABS
200.0000 mg | ORAL_TABLET | Freq: Two times a day (BID) | ORAL | Status: DC
  Administered 2022-03-06 – 2022-03-19 (×18): 200 mg via ORAL
  Filled 2022-03-06 (×20): qty 1

## 2022-03-06 MED ORDER — MORPHINE SULFATE (PF) 2 MG/ML IV SOLN
2.0000 mg | INTRAVENOUS | Status: DC | PRN
  Administered 2022-03-06 (×2): 2 mg via INTRAVENOUS
  Filled 2022-03-06 (×2): qty 1

## 2022-03-06 MED ORDER — ACETAMINOPHEN 325 MG PO TABS
650.0000 mg | ORAL_TABLET | Freq: Four times a day (QID) | ORAL | Status: DC | PRN
  Administered 2022-03-06 – 2022-03-18 (×2): 650 mg via ORAL

## 2022-03-06 MED ORDER — LOSARTAN POTASSIUM 25 MG PO TABS
25.0000 mg | ORAL_TABLET | Freq: Every day | ORAL | Status: DC
  Administered 2022-03-06 – 2022-03-09 (×3): 25 mg via ORAL
  Filled 2022-03-06 (×3): qty 1

## 2022-03-06 MED ORDER — ACETAMINOPHEN 325 MG PO TABS
ORAL_TABLET | ORAL | Status: AC
  Filled 2022-03-06: qty 2

## 2022-03-06 MED ORDER — SEVELAMER CARBONATE 800 MG PO TABS
1600.0000 mg | ORAL_TABLET | Freq: Three times a day (TID) | ORAL | Status: DC
  Administered 2022-03-06 – 2022-03-09 (×7): 1600 mg via ORAL
  Filled 2022-03-06 (×10): qty 2

## 2022-03-06 MED ORDER — HYDROCODONE-ACETAMINOPHEN 10-325 MG PO TABS
1.0000 | ORAL_TABLET | Freq: Three times a day (TID) | ORAL | Status: DC | PRN
  Administered 2022-03-06: 1 via ORAL
  Filled 2022-03-06 (×2): qty 1

## 2022-03-06 MED ORDER — APIXABAN 2.5 MG PO TABS
2.5000 mg | ORAL_TABLET | Freq: Two times a day (BID) | ORAL | Status: DC
  Administered 2022-03-06 – 2022-03-07 (×3): 2.5 mg via ORAL
  Filled 2022-03-06 (×5): qty 1

## 2022-03-06 MED ORDER — PIPERACILLIN-TAZOBACTAM IN DEX 2-0.25 GM/50ML IV SOLN
2.2500 g | Freq: Three times a day (TID) | INTRAVENOUS | Status: DC
  Filled 2022-03-06 (×2): qty 50

## 2022-03-06 MED ORDER — HEPARIN SODIUM (PORCINE) 1000 UNIT/ML DIALYSIS: 1000.0000 [IU] | INTRAMUSCULAR | Status: DC | PRN

## 2022-03-06 MED ORDER — ANTICOAGULANT SODIUM CITRATE 4% (200MG/5ML) IV SOLN: 5.0000 mL | Status: DC | PRN

## 2022-03-06 MED ORDER — ONDANSETRON HCL 4 MG PO TABS: 4.0000 mg | ORAL_TABLET | Freq: Four times a day (QID) | ORAL | Status: DC | PRN

## 2022-03-06 MED ORDER — PENTAFLUOROPROP-TETRAFLUOROETH EX AERO
1.0000 | INHALATION_SPRAY | CUTANEOUS | Status: DC | PRN
  Filled 2022-03-06 (×2): qty 30

## 2022-03-06 MED ORDER — CHLORHEXIDINE GLUCONATE CLOTH 2 % EX PADS
6.0000 | MEDICATED_PAD | Freq: Every day | CUTANEOUS | Status: DC
  Administered 2022-03-07 – 2022-03-19 (×11): 6 via TOPICAL
  Filled 2022-03-06: qty 6

## 2022-03-06 MED ORDER — PENTAFLUOROPROP-TETRAFLUOROETH EX AERO
INHALATION_SPRAY | CUTANEOUS | Status: AC
  Administered 2022-03-06: 1 via TOPICAL
  Filled 2022-03-06: qty 30

## 2022-03-06 MED ORDER — MORPHINE SULFATE ER 15 MG PO TBCR
30.0000 mg | EXTENDED_RELEASE_TABLET | Freq: Two times a day (BID) | ORAL | Status: DC
  Administered 2022-03-06 – 2022-03-09 (×5): 30 mg via ORAL
  Filled 2022-03-06 (×6): qty 2

## 2022-03-06 MED ORDER — ONDANSETRON HCL 4 MG/2ML IJ SOLN: 4.0000 mg | Freq: Four times a day (QID) | INTRAMUSCULAR | Status: DC | PRN

## 2022-03-06 MED ORDER — LIDOCAINE HCL (PF) 1 % IJ SOLN: 5.0000 mL | INTRAMUSCULAR | Status: DC | PRN

## 2022-03-06 MED ORDER — VANCOMYCIN HCL IN DEXTROSE 1-5 GM/200ML-% IV SOLN
1000.0000 mg | Freq: Once | INTRAVENOUS | Status: AC
  Administered 2022-03-06: 1000 mg via INTRAVENOUS
  Filled 2022-03-06: qty 200

## 2022-03-06 MED ORDER — PIPERACILLIN-TAZOBACTAM IN DEX 2-0.25 GM/50ML IV SOLN
2.2500 g | Freq: Three times a day (TID) | INTRAVENOUS | Status: DC
  Administered 2022-03-06 – 2022-03-12 (×20): 2.25 g via INTRAVENOUS
  Filled 2022-03-06 (×22): qty 50

## 2022-03-06 MED ORDER — LIDOCAINE-PRILOCAINE 2.5-2.5 % EX CREA: 1.0000 | TOPICAL_CREAM | CUTANEOUS | Status: DC | PRN

## 2022-03-06 MED ORDER — VANCOMYCIN HCL IN DEXTROSE 1-5 GM/200ML-% IV SOLN
1000.0000 mg | INTRAVENOUS | Status: DC
  Administered 2022-03-06 – 2022-03-08 (×2): 1000 mg via INTRAVENOUS
  Filled 2022-03-06 (×3): qty 200

## 2022-03-06 MED ORDER — GABAPENTIN 300 MG PO CAPS
300.0000 mg | ORAL_CAPSULE | Freq: Two times a day (BID) | ORAL | Status: DC
  Administered 2022-03-06 – 2022-03-09 (×6): 300 mg via ORAL
  Filled 2022-03-06 (×7): qty 1

## 2022-03-06 MED ORDER — CHLORHEXIDINE GLUCONATE CLOTH 2 % EX PADS: 6.0000 | MEDICATED_PAD | Freq: Once | CUTANEOUS | Status: DC

## 2022-03-06 MED ORDER — CARVEDILOL 6.25 MG PO TABS
3.1250 mg | ORAL_TABLET | Freq: Two times a day (BID) | ORAL | Status: DC
  Administered 2022-03-06 – 2022-03-09 (×3): 3.125 mg via ORAL
  Filled 2022-03-06 (×6): qty 1

## 2022-03-06 MED ORDER — METHYLPREDNISOLONE SODIUM SUCC 125 MG IJ SOLR: 125.0000 mg | Freq: Once | INTRAMUSCULAR | Status: DC | PRN

## 2022-03-06 MED ORDER — INSULIN ASPART 100 UNIT/ML IJ SOLN: 0.0000 [IU] | Freq: Every day | INTRAMUSCULAR | Status: DC

## 2022-03-06 MED ORDER — BUMETANIDE 1 MG PO TABS
1.0000 mg | ORAL_TABLET | Freq: Two times a day (BID) | ORAL | Status: DC
  Administered 2022-03-06 – 2022-03-19 (×16): 1 mg via ORAL
  Filled 2022-03-06 (×27): qty 1

## 2022-03-06 NOTE — Hospital Course (Addendum)
Adam Howell is a 61 y.o. male with medical history significant for diabetes mellitus with complications of end-stage renal disease on hemodialysis (M/W/F), history of A-fib on Eliquis and amiodarone, HFpEF (EF 35 to 40% 2021), secondary hyperparathyroidism with chronic nonhealing right lower extremity wound for which he was hospitalized for debridement and IV antibiotics from 10/31 to 11/6.  He was sent to the ED by his home health nurse due to concerns for increased drainage and foul odor from the wound in spite of ongoing dressings.  Patient also reports increased pain of the lower leg.   MRI right tib-fib suggesting myositis without osteomyelitis. Patient was started on Zosyn and vancomycin.  Patient was taken to the OR for debridement on 12/23.  Wound culture only grew out gram-negative rods, antibiotics switched to Zosyn only.  Patient had a prolonged episode of altered mental status postop, currently improved.  Had another episode of decreased responsiveness on 12/26.  Seen by neurology, most likely metabolic encephalopathy.  MRI of the brain reported as new stroke, neurology has reviewed it, does not believe this is stroke. 12/27.  Patient has dense encephalopathy, after discussion with neurology, ID, determined this is mainly metabolic in origin. 12/29.  Patient had right-sided above-knee amputation today.  Already waking up postop.

## 2022-03-06 NOTE — Assessment & Plan Note (Signed)
Sliding scale insulin coverage 

## 2022-03-06 NOTE — ED Notes (Signed)
RN called lab for assistance with blood collection after 2 failed attempts. Lab states will come to collect shortly

## 2022-03-06 NOTE — Assessment & Plan Note (Signed)
Continue Eliquis and amiodarone 

## 2022-03-06 NOTE — H&P (Signed)
History and Physical    Patient: Adam Howell KZS:[id number] DOB: 12/18/1960 DOA: 03/05/2022 DOS: the patient was seen and examined on 03/06/2022 PCP: Venita Lick, NP  Patient coming from: Home  Chief Complaint:  Chief Complaint  Patient presents with   Wound Check    HPI: Layken Doenges is a 61 y.o. male with medical history significant for diabetes mellitus with complications of end-stage renal disease on hemodialysis (M/W/F), history of A-fib on Eliquis and amiodarone, HFpEF (EF 35 to 40% 2021), secondary hyperparathyroidism with chronic nonhealing right lower extremity wound for which he was hospitalized for debridement and IV antibiotics from 10/31 to 11/6 who was sent to the ED by his home health nurse due to concerns for increased drainage and foul odor from the wound in spite of ongoing dressings.  Patient also reports increased pain of the lower leg.  He denies fever and chills.  Wound culture from back in October 11 Proteus mirabilis and Klebsiella oxytoca treated initially with Rocephin and vancomycin and transition to Keflex at discharge. ED course and data review: Vitals within normal limits though he was briefly tachycardic to 115 in the ED.  CBC unremarkable with normal lactic acid.  Mild hypokalemia on CMP. EKG, personally viewed and interpreted shows A-fib at 79. MRI right tib-fib suggesting myositis without osteomyelitis as follows: IMPRESSION: 1. Deep skin wound about the medial aspect of the tibia without evidence of fluid collection or abscess. 2. Increased signal of the muscles of the anterior compartment with edema along the tendons suggesting myositis. No evidence of fluid collection or abscess. 3. Marrow signal is within normal limits without evidence of osteomyelitis.  Patient was started on Zosyn and vancomycin and hospitalist consulted for admission.   Review of Systems: As mentioned in the history of present illness. All other systems  reviewed and are negative.  Past Medical History:  Diagnosis Date   Chronic kidney disease    Congestive heart failure ([place])    Diabetes mellitus without complication ([place])    Hyperlipidemia    Hypertension    Past Surgical History:  Procedure Laterality Date   WOUND DEBRIDEMENT Left    WOUND DEBRIDEMENT Right 01/16/2022   Procedure: DEBRIDEMENT WOUND;  Surgeon: Katha Cabal, MD;  Location: [facility] ORS;  Service: Vascular;  Laterality: Right;  Wound vac placement   Social History:  reports that he has never smoked. He has never used smokeless tobacco. He reports that he does not currently use alcohol. He reports that he does not use drugs.  Allergies  Allergen Reactions   Tramadol Rash    Family History  Problem Relation Age of Onset   Heart failure Mother    Cirrhosis Father    Hypertension Daughter     Prior to Admission medications   Medication Sig Start Date End Date Taking? Authorizing Provider  amiodarone (PACERONE) 200 MG tablet Take 1 tablet (200 mg total) by mouth 2 (two) times daily. 04/10/21   Cannady, Henrine Screws T, NP  bumetanide (BUMEX) 1 MG tablet Take 1 tablet (1 mg total) by mouth 2 (two) times daily. 04/10/21   Cannady, Henrine Screws T, NP  carvedilol (COREG) 3.125 MG tablet Take 1 tablet (3.125 mg total) by mouth 2 (two) times daily. 04/10/21   Cannady, Jolene T, NP  ELIQUIS 2.5 MG TABS tablet Take 1 tablet (2.5 mg total) by mouth 2 (two) times daily. Sig: Daily as presciribed. 04/10/21   Cannady, Henrine Screws T, NP  gabapentin (NEURONTIN) 300 MG capsule Take 1  capsule (300 mg total) by mouth 2 (two) times daily. 04/10/21   Cannady, Henrine Screws T, NP  HYDROcodone-acetaminophen (NORCO) 10-325 MG tablet Take 1 tablet by mouth every 8 (eight) hours as needed. Do not take more than is ordered and bring pill bottle to next provider visit. 02/19/22 03/21/22  Cannady, Henrine Screws T, NP  insulin aspart (NOVOLOG) 100 UNIT/ML FlexPen Inject 3 Units into the skin 3 (three) times daily with meals. As  needed only if blood sugar greater then 130 prior to meal. 08/29/21   Cannady, Jolene T, NP  isosorbide mononitrate (IMDUR) 30 MG 24 hr tablet Take 1 tablet (30 mg total) by mouth daily. 04/10/21   Cannady, Henrine Screws T, NP  losartan (COZAAR) 25 MG tablet Take 1 tablet (25 mg total) by mouth daily. 01/20/22   Emeterio Reeve, DO  NIFEdipine (PROCARDIA-XL/NIFEDICAL-XL) 30 MG 24 hr tablet Take 1 tablet (30 mg total) by mouth daily. 04/10/21   Cannady, Henrine Screws T, NP  rosuvastatin (CRESTOR) 40 MG tablet Take 1 tablet (40 mg total) by mouth daily. 04/10/21   Cannady, Henrine Screws T, NP  sertraline (ZOLOFT) 50 MG tablet Take 50 mg by mouth daily. 10/23/20   [provider]  sevelamer carbonate (RENVELA) 800 MG tablet Take 2 tablets (1,600 mg total) by mouth 3 (three) times daily. 04/10/21   [name] T, NP  tiZANidine (ZANAFLEX) 2 MG tablet TAKE 1 TABLET BY MOUTH ONCE DAILY AS NEEDED FOR MUSCLE SPASM 01/28/22   [name] T, NP    Physical Exam: Vitals:   03/05/22 1719 03/05/22 2024 03/05/22 2200 03/05/22 2347  BP: 118/80 (!) 141/75 92/66 102/70  Pulse: 88 87 (!) 115 (!) 103  Resp: 18 16  18   Temp: 98 F (36.7 C) 98.6 F (37 C)  98.4 F (36.9 C)  TempSrc: Oral Oral  Oral  SpO2: 99% 97%  98%  Weight:      Height:       Physical Exam Vitals and nursing note reviewed.  Constitutional:      General: He is not in acute distress. HENT:     Head: Normocephalic and atraumatic.  Cardiovascular:     Rate and Rhythm: Normal rate and regular rhythm.     Heart sounds: Normal heart sounds.  Pulmonary:     Effort: Pulmonary effort is normal.     Breath sounds: Normal breath sounds.  Abdominal:     Palpations: Abdomen is soft.     Tenderness: There is no abdominal tenderness.  Musculoskeletal:     Comments: Right lower leg in bandages over shin  Neurological:     Mental Status: Mental status is at baseline.     Labs on Admission: I have personally reviewed following labs and imaging  studies  CBC: Recent Labs  Lab 03/05/22 1323  WBC 9.1  NEUTROABS 7.0  HGB 12.9*  HCT 40.5  MCV 87.5  PLT 119   Basic Metabolic Panel: Recent Labs  Lab 03/05/22 1323  NA 133*  K 3.2*  CL 89*  CO2 30  GLUCOSE 102*  BUN 26*  CREATININE 7.14*  CALCIUM 8.8*   GFR: Estimated Creatinine Clearance: 15.1 mL/min (A) (by C-G formula based on SCr of 7.14 mg/dL (H)). Liver Function Tests: Recent Labs  Lab 03/05/22 1323  AST 16  ALT 10  ALKPHOS 85  BILITOT 1.6*  PROT 8.5*  ALBUMIN 3.0*   No results for input(s): "LIPASE", "AMYLASE" in the last 168 hours. No results for input(s): "AMMONIA" in the last 168  hours. Coagulation Profile: Recent Labs  Lab 03/05/22 1943  INR 1.2   Cardiac Enzymes: No results for input(s): "CKTOTAL", "CKMB", "CKMBINDEX", "TROPONINI" in the last 168 hours. BNP (last 3 results) No results for input(s): "PROBNP" in the last 8760 hours. HbA1C: No results for input(s): "HGBA1C" in the last 72 hours. CBG: No results for input(s): "GLUCAP" in the last 168 hours. Lipid Profile: No results for input(s): "CHOL", "HDL", "LDLCALC", "TRIG", "CHOLHDL", "LDLDIRECT" in the last 72 hours. Thyroid Function Tests: No results for input(s): "TSH", "T4TOTAL", "FREET4", "T3FREE", "THYROIDAB" in the last 72 hours. Anemia Panel: No results for input(s): "VITAMINB12", "FOLATE", "FERRITIN", "TIBC", "IRON", "RETICCTPCT" in the last 72 hours. Urine analysis: No results found for: "COLORURINE", "APPEARANCEUR", "LABSPEC", "PHURINE", "GLUCOSEU", "HGBUR", "BILIRUBINUR", "KETONESUR", "PROTEINUR", "UROBILINOGEN", "NITRITE", "LEUKOCYTESUR"  Radiological Exams on Admission: MR TIBIA FIBULA RIGHT WO CONTRAST  Result Date: 03/05/2022 CLINICAL DATA:  Chronic right lower extremity ulcer. History of CKD on dialysis. Anemia of chronic disease and type 2 diabetes. CHF. EXAM: MRI OF LOWER RIGHT EXTREMITY WITHOUT CONTRAST TECHNIQUE: Multiplanar, multisequence MR imaging of the right  tibia/fibula area of concern was performed. No intravenous contrast was administered. COMPARISON:  Radiograph dated January 02, 2022 FINDINGS: Bones/Joint/Cartilage Marrow signal is within normal limits. No evidence of osteomyelitis. No cortical erosion or periosteal reaction. No evidence of fracture or dislocation. Ligaments Interosseous ligament is intact. Muscles and Tendons Increased signal of the muscles of the anterior compartment with edema along the tendons suggesting myositis. No fluid collection or abscess. Soft tissues There is a deep skin wound about the medial aspect of the tibia without evidence of fluid collection or abscess. Generalized subcutaneous soft tissue edema about the lower extremity. No drainable fluid collection or abscess. IMPRESSION: 1. Deep skin wound about the medial aspect of the tibia without evidence of fluid collection or abscess. 2. Increased signal of the muscles of the anterior compartment with edema along the tendons suggesting myositis. No evidence of fluid collection or abscess. 3. Marrow signal is within normal limits without evidence of osteomyelitis. Electronically Signed   By: Keane Police D.O.   On: 03/05/2022 23:32     Data Reviewed: Relevant notes from primary care and specialist visits, past discharge summaries as available in EHR, including Care Everywhere. Prior diagnostic testing as pertinent to current admission diagnoses Updated medications and problem lists for reconciliation ED course, including vitals, labs, imaging, treatment and response to treatment Triage notes, nursing and pharmacy notes and ED provider's notes Notable results as noted in HPI   Assessment and Plan: Wound infection of chronic right lower extremity ulcer History of inpatient debridement October 2023 with cultures yielding Proteus Mirabella's and Klebsiella oxytoca MRI without evidence of fluid collection or abscess or osteomyelitis Continue Zosyn and vancomycin started in  the ED Wound care consult Surgery versus vascular consult in the a.m. for debridement.  Was last debrided by Dr. Franchot Gallo vascular on 24/2   Chronic systolic CHF (congestive heart failure) (HCC) Clinically euvolemic Continue Bumex, carvedilol, Imdur  Hyperlipidemia associated with type 2 diabetes mellitus (HCC) Continue rosuvastatin  Atrial fibrillation (HCC) Continue Eliquis and amiodarone  Type 2 diabetes mellitus with ESRD (end-stage renal disease) (HCC) Sliding scale insulin coverage  ESRD on dialysis [facility]) Nephrology consult for continuation of dialysis MWF  Anemia in chronic kidney disease Hemoglobin at baseline        DVT prophylaxis: Eliquis  Consults: Dr. Murlean Iba, nephrology, Dr. Franchot Gallo, vascular  Advance Care Planning:   Code Status: Prior   Family  Communication: none  Disposition Plan: Back to previous home environment  Severity of Illness: The appropriate patient status for this patient is INPATIENT. Inpatient status is judged to be reasonable and necessary in order to provide the required intensity of service to ensure the patient's safety. The patient's presenting symptoms, physical exam findings, and initial radiographic and laboratory data in the context of their chronic comorbidities is felt to place them at high risk for further clinical deterioration. Furthermore, it is not anticipated that the patient will be medically stable for discharge from the hospital within 2 midnights of admission.   * I certify that at the point of admission it is my clinical judgment that the patient will require inpatient hospital care spanning beyond 2 midnights from the point of admission due to high intensity of service, high risk for further deterioration and high frequency of surveillance required.*  Author: Athena Masse, MD 03/06/2022 12:36 AM  For on call review [email or web address].

## 2022-03-06 NOTE — Assessment & Plan Note (Signed)
Nephrology consult for continuation of dialysis MWF

## 2022-03-06 NOTE — ED Notes (Signed)
Patient provided with sandwich per request. Patient resting in bed free from sign of distress. Breathing unlabored speaking in full sentences with symmetric chest rise and fall. Bed low and locked with side rails raised x2. Call bell in reach and monitor in place.

## 2022-03-06 NOTE — ED Notes (Signed)
Transported to dialysis.

## 2022-03-06 NOTE — Assessment & Plan Note (Deleted)
Patient has a chronic right lower extremity deep wound s/p debridement October 2023 with cultures yielding Proteus Mirabella's and Klebsiella oxytoca MRI without evidence of fluid collection or abscess or osteomyelitis -Continue Zosyn and vancomycin started in the ED -Wound care placed their recommendations. -Dr. Evelina Bucy from vascular surgery was also reconsulted and will see the patient later today. -Continue with pain management

## 2022-03-06 NOTE — Consult Note (Signed)
[facility] Vascular Consult Note  MRN : [id number]  Adam Howell is a 61 y.o. (1960/04/04) male who presents with chief complaint of  Chief Complaint  Patient presents with   Wound Check  .   Consulting Physician: Reason for consult: History of Present Illness: Adam Howell is a 61 y.o. male with a history of CKD on dialysis MWF, anemia of chronic disease, type 2 diabetes, CHF, A-fib, and a chronic RLE ulcer, presents to the ED for evaluation.  According to the patient he had a debridement a little over 1 month ago to the right lower extremity chronic ulceration. Patient reports that his home health nurse who comes to the house twice weekly for wound care, was concerned because of the significant progression of the patient's wound since last week.  She last changed her dressing today, and noted since Tuesday, the wound appears to show exposed bone.  Patient denies any fevers, chills, sweats.  Would endorse that the wound has been weeping between dressing changes.     Current Facility-Administered Medications  Medication Dose Route Frequency Provider Last Rate Last Admin   acetaminophen (TYLENOL) tablet 650 mg  650 mg Oral Q6H PRN Athena Masse, MD   650 mg at 03/06/22 1604   Or   acetaminophen (TYLENOL) suppository 650 mg  650 mg Rectal Q6H PRN Athena Masse, MD       alteplase (CATHFLO ACTIVASE) injection 2 mg  2 mg Intracatheter Once PRN Colon Flattery, NP       amiodarone (PACERONE) tablet 200 mg  200 mg Oral BID Judd Gaudier V, MD   200 mg at 03/06/22 2683   anticoagulant sodium citrate solution 5 mL  5 mL Intracatheter PRN Colon Flattery, NP       apixaban (ELIQUIS) tablet 2.5 mg  2.5 mg Oral BID Judd Gaudier V, MD   2.5 mg at 03/06/22 0925   bumetanide (BUMEX) tablet 1 mg  1 mg Oral BID Athena Masse, MD   1 mg at 03/06/22 0925   carvedilol (COREG) tablet 3.125 mg  3.125 mg Oral BID WC Judd Gaudier V, MD   3.125 mg at 03/06/22  0747   [START ON 03/07/2022] Chlorhexidine Gluconate Cloth 2 % PADS 6 each  6 each Topical Q0600 Colon Flattery, NP       gabapentin (NEURONTIN) capsule 300 mg  300 mg Oral BID Judd Gaudier V, MD   300 mg at 03/06/22 0925   heparin injection 1,000 Units  1,000 Units Intracatheter PRN Colon Flattery, NP       HYDROcodone-acetaminophen (NORCO) 10-325 MG per tablet 1 tablet  1 tablet Oral Q8H PRN Athena Masse, MD   1 tablet at 03/06/22 0319   HYDROmorphone (DILAUDID) injection 1 mg  1 mg Intravenous Q4H PRN Lorella Nimrod, MD       insulin aspart (novoLOG) injection 0-5 Units  0-5 Units Subcutaneous QHS Judd Gaudier V, MD       insulin aspart (novoLOG) injection 0-6 Units  0-6 Units Subcutaneous TID WC Athena Masse, MD       isosorbide mononitrate (IMDUR) 24 hr tablet 30 mg  30 mg Oral Daily Judd Gaudier V, MD   30 mg at 03/06/22 0925   lidocaine (PF) (XYLOCAINE) 1 % injection 5 mL  5 mL Intradermal PRN Colon Flattery, NP       lidocaine-prilocaine (EMLA) cream 1 Application  1 Application Topical PRN Colon Flattery, NP  losartan (COZAAR) tablet 25 mg  25 mg Oral Daily Judd Gaudier V, MD   25 mg at 03/06/22 0925   morphine (MS CONTIN) 12 hr tablet 30 mg  30 mg Oral Q12H Lorella Nimrod, MD   30 mg at 03/06/22 0953   ondansetron (ZOFRAN) tablet 4 mg  4 mg Oral Q6H PRN Athena Masse, MD       Or   ondansetron [facility]) injection 4 mg  4 mg Intravenous Q6H PRN Athena Masse, MD       pentafluoroprop-tetrafluoroeth (GEBAUERS) aerosol 1 Application  1 Application Topical PRN Colon Flattery, NP   1 Application at 54/27/06 1547   piperacillin-tazobactam (ZOSYN) IVPB 2.25 g  2.25 g Intravenous Q8H Athena Masse, MD   Stopped at 03/06/22 1233   rosuvastatin (CRESTOR) tablet 40 mg  40 mg Oral Daily Athena Masse, MD   40 mg at 03/06/22 2376   sertraline (ZOLOFT) tablet 50 mg  50 mg Oral Daily Athena Masse, MD   50 mg at 03/06/22 2831   sevelamer carbonate (RENVELA) tablet  1,600 mg  1,600 mg Oral TID with meals Judd Gaudier V, MD   1,600 mg at 03/06/22 1220   tiZANidine (ZANAFLEX) tablet 2 mg  2 mg Oral Daily PRN Athena Masse, MD       vancomycin (VANCOCIN) IVPB 1000 mg/200 mL premix  1,000 mg Intravenous Q M,W,F-HD Athena Masse, MD   Stopped at 03/06/22 1350   Current Outpatient Medications  Medication Sig Dispense Refill   HYDROcodone-acetaminophen (NORCO) 10-325 MG tablet Take 1 tablet by mouth every 8 (eight) hours as needed. Do not take more than is ordered and bring pill bottle to next provider visit. 90 tablet 0   insulin aspart (NOVOLOG) 100 UNIT/ML FlexPen Inject 3 Units into the skin 3 (three) times daily with meals. As needed only if blood sugar greater then 130 prior to meal. 15 mL 2   losartan (COZAAR) 25 MG tablet Take 1 tablet (25 mg total) by mouth daily. 30 tablet 0   rosuvastatin (CRESTOR) 40 MG tablet Take 1 tablet (40 mg total) by mouth daily. 90 tablet 4   sevelamer carbonate (RENVELA) 800 MG tablet Take 2 tablets (1,600 mg total) by mouth 3 (three) times daily. 360 tablet 4   tiZANidine (ZANAFLEX) 2 MG tablet TAKE 1 TABLET BY MOUTH ONCE DAILY AS NEEDED FOR MUSCLE SPASM 30 tablet 4   amiodarone (PACERONE) 200 MG tablet Take 1 tablet (200 mg total) by mouth 2 (two) times daily. (Patient not taking: Reported on 03/06/2022) 180 tablet 4   bumetanide (BUMEX) 1 MG tablet Take 1 tablet (1 mg total) by mouth 2 (two) times daily. (Patient not taking: Reported on 03/06/2022) 90 tablet 4   carvedilol (COREG) 3.125 MG tablet Take 1 tablet (3.125 mg total) by mouth 2 (two) times daily. (Patient not taking: Reported on 03/06/2022) 180 tablet 4   ELIQUIS 2.5 MG TABS tablet Take 1 tablet (2.5 mg total) by mouth 2 (two) times daily. Sig: Daily as presciribed. (Patient not taking: Reported on 03/06/2022) 180 tablet 4   gabapentin (NEURONTIN) 300 MG capsule Take 1 capsule (300 mg total) by mouth 2 (two) times daily. (Patient not taking: Reported on  03/06/2022) 180 capsule 4   isosorbide mononitrate (IMDUR) 30 MG 24 hr tablet Take 1 tablet (30 mg total) by mouth daily. (Patient not taking: Reported on 03/06/2022) 90 tablet 4   NIFEdipine (PROCARDIA-XL/NIFEDICAL-XL) 30 MG 24 hr tablet  Take 1 tablet (30 mg total) by mouth daily. (Patient not taking: Reported on 03/06/2022) 90 tablet 4   sertraline (ZOLOFT) 50 MG tablet Take 50 mg by mouth daily. (Patient not taking: Reported on 03/06/2022)      Past Medical History:  Diagnosis Date   Chronic kidney disease    Congestive heart failure ([place])    Diabetes mellitus without complication ([place])    Hyperlipidemia    Hypertension     Past Surgical History:  Procedure Laterality Date   WOUND DEBRIDEMENT Left    WOUND DEBRIDEMENT Right 01/16/2022   Procedure: DEBRIDEMENT WOUND;  Surgeon: Katha Cabal, MD;  Location: [facility] ORS;  Service: Vascular;  Laterality: Right;  Wound vac placement    Social History Social History   Tobacco Use   Smoking status: Never   Smokeless tobacco: Never  Vaping Use   Vaping Use: Never used  Substance Use Topics   Alcohol use: Not Currently   Drug use: Never    Family History Family History  Problem Relation Age of Onset   Heart failure Mother    Cirrhosis Father    Hypertension Daughter     Allergies  Allergen Reactions   Tramadol Rash     REVIEW OF SYSTEMS (Negative unless checked)  Constitutional: [] Weight loss  [] Fever  [] Chills Cardiac: [] Chest pain   [] Chest pressure   [] Palpitations   [] Shortness of breath when laying flat   [] Shortness of breath at rest   [] Shortness of breath with exertion. Vascular:  [] Pain in legs with walking   [] Pain in legs at rest   [] Pain in legs when laying flat   [] Claudication   [] Pain in feet when walking  [x] Pain in feet at rest  [] Pain in feet when laying flat   [] History of DVT   [] Phlebitis   [] Swelling in legs   [] Varicose veins   [] Non-healing ulcers Pulmonary:   [] Uses home oxygen   [] Productive  cough   [] Hemoptysis   [] Wheeze  [] COPD   [] Asthma Neurologic:  [] Dizziness  [] Blackouts   [] Seizures   [] History of stroke   [] History of TIA  [] Aphasia   [] Temporary blindness   [] Dysphagia   [] Weakness or numbness in arms   [] Weakness or numbness in legs Musculoskeletal:  [] Arthritis   [] Joint swelling   [] Joint pain   [] Low back pain Hematologic:  [] Easy bruising  [] Easy bleeding   [] Hypercoagulable state   [] Anemic  [] Hepatitis Gastrointestinal:  [] Blood in stool   [] Vomiting blood  [] Gastroesophageal reflux/heartburn   [] Difficulty swallowing. Genitourinary:  [x] Chronic kidney disease   [] Difficult urination  [] Frequent urination  [] Burning with urination   [] Blood in urine Skin:  [] Rashes   [x] Ulcers   [x] Wounds Psychological:  [] History of anxiety   []  History of major depression.  Physical Examination  Vitals:   03/06/22 1830 03/06/22 1900 03/06/22 1928 03/06/22 2018  BP: 108/80 113/79 122/71 107/60  Pulse: 89 85 84 91  Resp:  17 12 16   Temp:   98.1 F (36.7 C)   TempSrc:   Oral   SpO2: 97% 97% 95% 98%  Weight:      Height:       Body mass index is 31.87 kg/m. Gen:  WD/WN, NAD Head: [place]/AT, No temporalis wasting. Prominent temp pulse not noted. Ear/Nose/Throat: Hearing grossly intact, nares w/o erythema or drainage, oropharynx w/o Erythema/Exudate Eyes: Sclera non-icteric, conjunctiva clear Neck: Trachea midline.  No JVD.  Pulmonary:  Good air movement, respirations not labored, equal bilaterally.  Cardiac: RRR, normal S1, S2. Vascular: Open wound to right lower extremity. Odorous and draining pus. Open visible tendon. No pulses in the right lower extremity are palpable. Foot is cold to the touch.  Vessel Right Left  Radial Palpable Palpable  Ulnar Palpable Palpable  Brachial Palpable Palpable  Carotid Palpable, without bruit Palpable, without bruit  Aorta Not palpable   Femoral N/A Palpable  Popliteal N/A Palpable  PT N/A N/A  DP N/A N/A   Gastrointestinal: soft,  non-tender/non-distended. No guarding/reflex.  Musculoskeletal: M/S 5/5 throughout.  Extremities without ischemic changes.  No deformity or atrophy. No edema. Neurologic: Sensation grossly intact in extremities.  Symmetrical.  Speech is fluent. Motor exam as listed above. Psychiatric: Judgment intact, Mood & affect appropriate for pt's clinical situation. Dermatologic: No rashes or ulcers noted.  No cellulitis or open wounds. Lymph : No Cervical, Axillary, or Inguinal lymphadenopathy.    CBC Lab Results  Component Value Date   WBC 9.1 03/05/2022   HGB 12.9 (L) 03/05/2022   HCT 40.5 03/05/2022   MCV 87.5 03/05/2022   PLT 350 03/05/2022    BMET    Component Value Date/Time   NA 133 (L) 03/05/2022 1323   NA 136 02/03/2022 1441   K 3.2 (L) 03/05/2022 1323   CL 89 (L) 03/05/2022 1323   CO2 30 03/05/2022 1323   GLUCOSE 102 (H) 03/05/2022 1323   BUN 26 (H) 03/05/2022 1323   BUN 35 (H) 02/03/2022 1441   CREATININE 7.14 (H) 03/05/2022 1323   CALCIUM 8.8 (L) 03/05/2022 1323   GFRNONAA 8 (L) 03/05/2022 1323   Estimated Creatinine Clearance: 15.1 mL/min (A) (by C-G formula based on SCr of 7.14 mg/dL (H)).  COAG Lab Results  Component Value Date   INR 1.2 03/05/2022    Radiology MR TIBIA FIBULA RIGHT WO CONTRAST  Result Date: 03/05/2022 CLINICAL DATA:  Chronic right lower extremity ulcer. History of CKD on dialysis. Anemia of chronic disease and type 2 diabetes. CHF. EXAM: MRI OF LOWER RIGHT EXTREMITY WITHOUT CONTRAST TECHNIQUE: Multiplanar, multisequence MR imaging of the right tibia/fibula area of concern was performed. No intravenous contrast was administered. COMPARISON:  Radiograph dated January 02, 2022 FINDINGS: Bones/Joint/Cartilage Marrow signal is within normal limits. No evidence of osteomyelitis. No cortical erosion or periosteal reaction. No evidence of fracture or dislocation. Ligaments Interosseous ligament is intact. Muscles and Tendons Increased signal of the muscles  of the anterior compartment with edema along the tendons suggesting myositis. No fluid collection or abscess. Soft tissues There is a deep skin wound about the medial aspect of the tibia without evidence of fluid collection or abscess. Generalized subcutaneous soft tissue edema about the lower extremity. No drainable fluid collection or abscess. IMPRESSION: 1. Deep skin wound about the medial aspect of the tibia without evidence of fluid collection or abscess. 2. Increased signal of the muscles of the anterior compartment with edema along the tendons suggesting myositis. No evidence of fluid collection or abscess. 3. Marrow signal is within normal limits without evidence of osteomyelitis. Electronically Signed   By: Keane Police D.O.   On: 03/05/2022 23:32      Assessment/Plan 1. Lower extremity Wound:      Patient scheduled to go to the operating room tomorrow 03/07/2022 for wound debridement of Right lower extremity. Dis cussed in detail with the patient at some point in time he may need an above the knee amputation. He verbalized his understanding. He does not wish to proceed with amputation today. I discussed  the procedure, benefits, complications and the risks of debridement tomorrow as well as amputation at a future date.   Plan of care discussed with Dr Ella Jubilee MD and he is in agreement with plan noted above.   Family Communication:  Total Time:75 I spent 75 minutes in this encounter including personally reviewing extensive medical records, personally reviewing imaging studies and compared to prior scans, counseling the patient, placing orders, coordinating care and performing appropriate documentation  Thank you for allowing [place] to participate in the care of this patient.   Drema Pry, NP [facility] [phone] (Office Phone) [phone] (Office Fax) [phone] (Pager)  03/06/2022 8:36 PM  Staff may message me via secure chat in [place]  but this may  not receive immediate response,  please page for urgent matters!  Dictation software was used to generate the above note. Typos may occur and escape review, as with typed/written notes. Any error is purely unintentional.  Please contact me directly for clarity if needed.

## 2022-03-06 NOTE — Consult Note (Signed)
[facility] Nurse Consult Note: Reason for Consult:full thickness wound to right pretibial area. Patient is followed by the outpatient wound care center. Last seen by provider in that setting on 02/03/22. I will continue the POC from that Provider.Photos uploaded to EMR yesterday by EDP. Wound type:full thickness Pressure Injury POA: N/A Measurement:Per [place] on 02/03/22: 10cm x 8.5cm x 0.2cm Wound bed:Red with exposed tendon visible. Drainage (amount, consistency, odor) moderate serous  Periwound: edematous Dressing procedure/placement/frequency: Cleanse with NS, pat dry. Apply silver hydrofiber (Aquacel, Ag+ Advantage, Kellie Simmering [phone]), top with dry gauze, ABD pad and secure with Kerlix roll gauze applied form just below toes to just below knee. Top with 6-inch ACE bandage applied in a similar manner. Change daily. May moisten Aquacel Ag+ Advantage with NS if adherent. Place foot into [employer]. A sacral foam is to be placed for PI prevention.  Patient should return to the outpatient [facility] for continued follow up post discharge.   [facility] nursing team will not follow, but will remain available to this patient, the nursing and medical teams.  Please re-consult if needed.  Thank you for inviting [place] to participate in this patient's Plan of Care.  Maudie Flakes, MSN, RN, CNS, [place], Serita Grammes, [employer], [employer] phone:  [phone]

## 2022-03-06 NOTE — IPAL (Signed)
  Interdisciplinary Goals of Care Family Meeting   Date carried out: 03/06/2022  Location of the meeting: Bedside  Member's involved: Physician  Durable Power of Attorney or acting medical decision maker: patient    Discussion: We discussed goals of care for [employer] .    Code status:   Code Status: Full Code   Disposition: Continue current acute care  Time spent for the meeting: [place], MD  03/06/2022, 12:58 AM

## 2022-03-06 NOTE — Assessment & Plan Note (Signed)
Continue rosuvastatin.  

## 2022-03-06 NOTE — ED Notes (Signed)
Pt reported 10/10 pain and attempted to give pt norco PRN order and pt refused reporting "that doesn't help, I need the IV stuff they were giving."

## 2022-03-06 NOTE — ED Notes (Signed)
Report given to Dialysis.

## 2022-03-06 NOTE — Progress Notes (Signed)
[facility], [place] 03/06/22  Subjective:   LOS: 1  Patient known to our practice from previous admissions.  Patient admitted for worsening right lower extremity ulcer.  Currently getting IV broad-spectrum antibiotics.  Patient reports that he was told that he will need a amputation. Patient to be dialyzed today.  Objective:  Vital signs in last 24 hours:  Temp:  [97.2 F (36.2 C)-98.6 F (37 C)] 97.2 F (36.2 C) (12/22 1532) Pulse Rate:  [78-115] 81 (12/22 1730) Resp:  [16-20] 18 (12/22 1600) BP: (79-141)/(46-86) 95/64 (12/22 1730) SpO2:  [94 %-99 %] 96 % (12/22 1730)  Weight change:  Filed Weights   03/05/22 1312  Weight: 115.7 kg    Intake/Output:    Intake/Output Summary (Last 24 hours) at 03/06/2022 1754 Last data filed at 03/06/2022 1350 Gross per 24 hour  Intake 500 ml  Output --  Net 500 ml     Physical Exam: General: Chronically ill-appearing, laying in the bed  HEENT Moist oral mucous membranes  Pulm/lungs Normal breathing effort on room air  CVS/Heart No rub  Abdomen:  Soft, nontender, nondistended  Extremities: Chronic lower extremity wound, trace edema  Neurologic: Alert, oriented  Skin: No acute rashes  Access: AV fistula       Basic Metabolic Panel:  Recent Labs  Lab 03/05/22 1323  NA 133*  K 3.2*  CL 89*  CO2 30  GLUCOSE 102*  BUN 26*  CREATININE 7.14*  CALCIUM 8.8*     CBC: Recent Labs  Lab 03/05/22 1323  WBC 9.1  NEUTROABS 7.0  HGB 12.9*  HCT 40.5  MCV 87.5  PLT 350      Lab Results  Component Value Date   HEPBSAG NON REACTIVE 01/14/2022      Microbiology:  Recent Results (from the past 240 hour(s))  Culture, blood (routine x 2)     Status: None (Preliminary result)   Collection Time: 03/05/22  7:43 PM   Specimen: BLOOD  Result Value Ref Range Status   Specimen Description BLOOD BLOOD RIGHT ARM  Final   Special Requests   Final    BOTTLES DRAWN AEROBIC AND ANAEROBIC Blood  Culture results may not be optimal due to an inadequate volume of blood received in culture bottles   Culture   Final    NO GROWTH < 12 HOURS Performed at [facility], [address] [place], [place] [address]    Report Status PENDING  Incomplete    Coagulation Studies: Recent Labs    03/05/22 1943  LABPROT 15.1  INR 1.2    Urinalysis: No results for input(s): "COLORURINE", "LABSPEC", "PHURINE", "GLUCOSEU", "HGBUR", "BILIRUBINUR", "KETONESUR", "PROTEINUR", "UROBILINOGEN", "NITRITE", "LEUKOCYTESUR" in the last 72 hours.  Invalid input(s): "APPERANCEUR"    Imaging: MR TIBIA FIBULA RIGHT WO CONTRAST  Result Date: 03/05/2022 CLINICAL DATA:  Chronic right lower extremity ulcer. History of CKD on dialysis. Anemia of chronic disease and type 2 diabetes. CHF. EXAM: MRI OF LOWER RIGHT EXTREMITY WITHOUT CONTRAST TECHNIQUE: Multiplanar, multisequence MR imaging of the right tibia/fibula area of concern was performed. No intravenous contrast was administered. COMPARISON:  Radiograph dated January 02, 2022 FINDINGS: Bones/Joint/Cartilage Marrow signal is within normal limits. No evidence of osteomyelitis. No cortical erosion or periosteal reaction. No evidence of fracture or dislocation. Ligaments Interosseous ligament is intact. Muscles and Tendons Increased signal of the muscles of the anterior compartment with edema along the tendons suggesting myositis. No fluid collection or abscess. Soft tissues There is a deep  skin wound about the medial aspect of the tibia without evidence of fluid collection or abscess. Generalized subcutaneous soft tissue edema about the lower extremity. No drainable fluid collection or abscess. IMPRESSION: 1. Deep skin wound about the medial aspect of the tibia without evidence of fluid collection or abscess. 2. Increased signal of the muscles of the anterior compartment with edema along the tendons suggesting myositis. No evidence of fluid collection or abscess.  3. Marrow signal is within normal limits without evidence of osteomyelitis. Electronically Signed   By: Keane Police D.O.   On: 03/05/2022 23:32     Medications:    anticoagulant sodium citrate     piperacillin-tazobactam (ZOSYN)  IV Stopped (03/06/22 1233)   vancomycin Stopped (03/06/22 1350)    amiodarone  200 mg Oral BID   apixaban  2.5 mg Oral BID   bumetanide  1 mg Oral BID   carvedilol  3.125 mg Oral BID WC   [START ON 03/07/2022] Chlorhexidine Gluconate Cloth  6 each Topical Q0600   gabapentin  300 mg Oral BID   insulin aspart  0-5 Units Subcutaneous QHS   insulin aspart  0-6 Units Subcutaneous TID WC   isosorbide mononitrate  30 mg Oral Daily   losartan  25 mg Oral Daily   morphine  30 mg Oral Q12H   rosuvastatin  40 mg Oral Daily   sertraline  50 mg Oral Daily   sevelamer carbonate  1,600 mg Oral TID with meals   acetaminophen **OR** acetaminophen, alteplase, anticoagulant sodium citrate, heparin, HYDROcodone-acetaminophen, HYDROmorphone (DILAUDID) injection, lidocaine (PF), lidocaine-prilocaine, ondansetron **OR** ondansetron (ZOFRAN) IV, pentafluoroprop-tetrafluoroeth, tiZANidine  Assessment/ Plan:  61 y.o. male with diabetes with complications, ESRD, atrial fibrillation requiring Eliquis, amiodarone, congestive heart failure, secondary hyperparathyroidism, chronic nonhealing right lower extremity wound was admitted on 03/05/2022 for  Principal Problem:   Wound infection of chronic right lower extremity ulcer Active Problems:   Anemia in chronic kidney disease   ESRD on dialysis ([place])   Type 2 diabetes mellitus with ESRD (end-stage renal disease) ([place])   Atrial fibrillation ([place])   Hyperlipidemia associated with type 2 diabetes mellitus ([place])   Chronic systolic CHF (congestive heart failure) ([place])  Unspecified open wound, right lower leg, initial encounter [S81.801A] Necrotizing fasciitis of lower leg ([place]) [M72.6]  #. ESRD [facility]/MWF/left aVF    Plan for hemodialysis today, then on Sunday due to holiday schedule  #. Anemia of CKD  Lab Results  Component Value Date   HGB 12.9 (L) 03/05/2022   Low dose EPO with HD for hemoglobin less than 10  #. Secondary hyperparathyroidism of renal origin N 25.81   No results found for: "PTH" Lab Results  Component Value Date   PHOS 5.7 (H) 01/19/2022   Monitor calcium and phos level during this admission   #.  Nonhealing right lower extremity ulcer with wound infection -Broad-spectrum antibiotics -Surgical evaluation   LOS: [place] 12/22/20235:54 PM  [place], [place]

## 2022-03-06 NOTE — Assessment & Plan Note (Addendum)
History of inpatient debridement October 2023 with cultures yielding Proteus Mirabella's and Klebsiella oxytoca MRI without evidence of fluid collection or abscess or osteomyelitis -Continue Zosyn and vancomycin started in the ED -Wound care placed recommendations in chart -Dr. Evelina Bucy from vascular surgery was consulted-will see the patient later today -Continue with pain management

## 2022-03-06 NOTE — Progress Notes (Signed)
Progress Note   Patient: Adam Howell EAV:[id number] DOB: [birth year] DOA: 03/05/2022     1 DOS: the patient was seen and examined on 03/06/2022   Brief hospital course: Taken from H&P.   Adam Howell is a 61 y.o. male with medical history significant for diabetes mellitus with complications of end-stage renal disease on hemodialysis (M/W/F), history of A-fib on Eliquis and amiodarone, HFpEF (EF 35 to 40% 2021), secondary hyperparathyroidism with chronic nonhealing right lower extremity wound for which he was hospitalized for debridement and IV antibiotics from 10/31 to 11/6 who was sent to the ED by his home health nurse due to concerns for increased drainage and foul odor from the wound in spite of ongoing dressings.  Patient also reports increased pain of the lower leg.  He denies fever and chills.  Wound culture from back in October 11 Proteus mirabilis and Klebsiella oxytoca treated initially with Rocephin and vancomycin and transition to Keflex at discharge. ED course and data review: Vitals within normal limits though he was briefly tachycardic to 115 in the ED.  CBC unremarkable with normal lactic acid.  Mild hypokalemia on CMP. EKG, personally viewed and interpreted shows A-fib at 79. MRI right tib-fib suggesting myositis without osteomyelitis as follows: IMPRESSION: 1. Deep skin wound about the medial aspect of the tibia without evidence of fluid collection or abscess. 2. Increased signal of the muscles of the anterior compartment with edema along the tendons suggesting myositis. No evidence of fluid collection or abscess. 3. Marrow signal is within normal limits without evidence of osteomyelitis.   Patient was started on Zosyn and vancomycin. Prior wound debridement was done by vascular surgeon  Dr. Franchot Gallo on 11/3 , who was consulted.  12/22: Vitals remained stable.  Elevated inflammatory markers with CRP of 25.4, ESR 68 and low P albumin at 8.  Dr. Evelina Bucy from  vascular surgery was also consulted and will see the patient later today, there is some concern of being noncompliant with his follow-up appointments with him.  Nephrology was also consulted for continuation of dialysis.     Assessment and Plan: * Wound infection of chronic right lower extremity ulcer History of inpatient debridement October 2023 with cultures yielding Proteus Mirabella's and Klebsiella oxytoca MRI without evidence of fluid collection or abscess or osteomyelitis -Continue Zosyn and vancomycin started in the ED -Wound care placed recommendations in chart -Dr. Evelina Bucy from vascular surgery was consulted-will see the patient later today -Continue with pain management  Chronic systolic CHF (congestive heart failure) (HCC) Clinically euvolemic Continue Bumex, carvedilol, Imdur  Type 2 diabetes mellitus with ESRD (end-stage renal disease) ([place]) Sliding scale insulin coverage  ESRD on dialysis [facility]) Nephrology consult for continuation of dialysis MWF  Atrial fibrillation (HCC) Continue Eliquis and amiodarone  Anemia in chronic kidney disease Hemoglobin at baseline -Continue to monitor.  Hyperlipidemia associated with type 2 diabetes mellitus ([place]) Continue rosuvastatin   Subjective: Patient was seen and examined in the hallway.  Complaining of 10 out of 10 pain in the right lower extremity.  Physical Exam: Vitals:   03/05/22 2347 03/06/22 0430 03/06/22 0749 03/06/22 1222  BP: 102/70 (!) 104/49 117/86 (!) 109/59  Pulse: (!) 103 100 88 86  Resp: _0 Temp: 98.4 F (36.9 C) 98.3 F (36.8 C) 98.3 F (36.8 C) 98.2 F (36.8 C)  TempSrc: Oral Oral Oral Oral  SpO2: 98% 96% 95% 95%  Weight:      Height:  General.  Well-developed gentleman, in no acute distress. Pulmonary.  Lungs clear bilaterally, normal respiratory effort. CV.  Regular rate and rhythm, no JVD, rub or murmur. Abdomen.  Soft, nontender, nondistended, BS positive. CNS.  Alert and  oriented .  No focal neurologic deficit. Extremities.  Right lower extremity with bandage and some open wound showing close to ankle, with tendon exposure. Psychiatry.  Judgment and insight appears normal.   Data Reviewed: Prior data reviewed  Family Communication: Discussed with daughter on phone. She was upset that why he is still in the hallway, and wanted him to be transferred immediately.  Explained that transferring might not be possible which she can sign him off if she really wants that option otherwise he will be taken care of here.  Reassured her that vascular surgery is going to see her and he will be transferring to the room as soon as 1 became available.  She wants daily updates.  Disposition: Status is: Inpatient Remains inpatient appropriate because: Severity of illness  Planned Discharge Destination: Home with Home Health  DVT prophylaxis.  Eliquis Time spent:  minutes  This record has been created using Dragon voice recognition software. Errors have been sought and corrected,but may not always be located. Such creation errors do not reflect on the standard of care.   Author: Sumayya Amin, MD 03/06/2022 1:06 PM  For on call review [email or web address].  

## 2022-03-06 NOTE — Progress Notes (Signed)
Pharmacy Antibiotic Note  Adam Howell is a 61 y.o. male admitted on 03/05/2022 with  wound infection .  Pharmacy has been consulted for Vanc, Zosyn dosing.  Pt is ESRD, on HD every MWF.   Plan: Zosyn 2.25 gm IV Q8H ordered to start on 12/22 @ 0400  Vancomycin 1500 mg X 1 given in ED on 12/21 @ 0031.  Will order additional Vanc 1 gm to make total loading dose of 2500 mg.  Vancomycin 1 gm IV Q MWF - HD ordered to start on 12/22.   Height: 6\' 3"  (190.5 cm) Weight: 115.7 kg (255 lb) IBW/kg (Calculated) : 84.5  Temp (24hrs), Avg:98.3 F (36.8 C), Min:98 F (36.7 C), Max:98.6 F (37 C)  Recent Labs  Lab 03/05/22 1323 03/05/22 1943  WBC 9.1  --   CREATININE 7.14*  --   LATICACIDVEN 1.3 1.3    Estimated Creatinine Clearance: 15.1 mL/min (A) (by C-G formula based on SCr of 7.14 mg/dL (H)).    Allergies  Allergen Reactions   Tramadol Rash    Antimicrobials this admission:   >>    >>   Dose adjustments this admission:   Microbiology results:  BCx:   UCx:    Sputum:    MRSA PCR:   Thank you for allowing pharmacy to be a part of this patient's care.  Gradie Ohm D 03/06/2022 1:19 AM

## 2022-03-06 NOTE — ED Notes (Signed)
This RN to take over care for pt, 2nd BC has not been collected by primary RN, 2 rounds of ABX have already been infused, will consult Dr. Damita Dunnings to verify if 2nd [facility] is still needed.

## 2022-03-06 NOTE — Assessment & Plan Note (Addendum)
Hemoglobin at baseline.  Continue to monitor. 

## 2022-03-06 NOTE — Progress Notes (Signed)
Patient tolerated 3.5 hours of HD.  UF:1L    03/06/22 1928  Vitals  Temp 98.1 F (36.7 C)  Temp Source Oral  BP 122/71  MAP (mmHg) 84  BP Location Right Wrist  BP Method Automatic  Patient Position (if appropriate) Lying  Pulse Rate 84  Pulse Rate Source Monitor  ECG Heart Rate 83  Resp 12  Oxygen Therapy  SpO2 95 %  O2 Device Room Air  Patient Activity (if Appropriate) In bed  Pulse Oximetry Type Continuous  During Treatment Monitoring  HD Safety Checks Performed Yes  Intra-Hemodialysis Comments Tolerated well;Tx completed  Post Treatment  Dialyzer Clearance Lightly streaked  Duration of HD Treatment -hour(s) 3.5 hour(s)  Liters Processed 84  Fluid Removed (mL) 1000 mL  Tolerated HD Treatment Yes  AVG/AVF Arterial Site Held (minutes) 10 minutes  AVG/AVF Venous Site Held (minutes) 10 minutes  Fistula / Graft Left Forearm Arteriovenous fistula  No placement date or time found.   Placed prior to admission: Yes  Orientation: Left  Access Location: Forearm  Access Type: Arteriovenous fistula  Site Condition No complications  Fistula / Graft Assessment Present;Thrill;Bruit  Status Deaccessed  Drainage Description None

## 2022-03-06 NOTE — Assessment & Plan Note (Signed)
Clinically euvolemic Continue Bumex, carvedilol, Imdur

## 2022-03-06 NOTE — Plan of Care (Signed)

## 2022-03-07 ENCOUNTER — Other Ambulatory Visit: Payer: Self-pay

## 2022-03-07 ENCOUNTER — Inpatient Hospital Stay: Payer: Medicare Other | Admitting: Anesthesiology

## 2022-03-07 ENCOUNTER — Encounter
Admission: EM | Disposition: A | Discharge status: Rehabilitation Facility | Payer: Self-pay | Source: Home / Self Care | Attending: Internal Medicine

## 2022-03-07 DIAGNOSIS — I5022 Chronic systolic (congestive) heart failure: Secondary | ICD-10-CM

## 2022-03-07 DIAGNOSIS — E876 Hypokalemia: Secondary | ICD-10-CM | POA: Insufficient documentation

## 2022-03-07 DIAGNOSIS — Z992 Dependence on renal dialysis: Secondary | ICD-10-CM

## 2022-03-07 DIAGNOSIS — E871 Hypo-osmolality and hyponatremia: Secondary | ICD-10-CM | POA: Insufficient documentation

## 2022-03-07 DIAGNOSIS — I482 Chronic atrial fibrillation, unspecified: Secondary | ICD-10-CM

## 2022-03-07 DIAGNOSIS — T148XXA Other injury of unspecified body region, initial encounter: Secondary | ICD-10-CM | POA: Diagnosis not present

## 2022-03-07 DIAGNOSIS — N186 End stage renal disease: Secondary | ICD-10-CM | POA: Diagnosis not present

## 2022-03-07 HISTORY — PX: WOUND DEBRIDEMENT: SHX247

## 2022-03-07 HISTORY — PX: APPLICATION OF WOUND VAC: SHX5189

## 2022-03-07 LAB — GLUCOSE, CAPILLARY
Glucose-Capillary: 100 mg/dL — ABNORMAL HIGH (ref 70–99)
Glucose-Capillary: 93 mg/dL (ref 70–99)
Glucose-Capillary: 97 mg/dL (ref 70–99)
Glucose-Capillary: 114 mg/dL — ABNORMAL HIGH (ref 70–99)
Glucose-Capillary: 134 mg/dL — ABNORMAL HIGH (ref 70–99)

## 2022-03-07 LAB — CBC
HCT: 35.3 % — ABNORMAL LOW (ref 39.0–52.0)
Hemoglobin: 11.4 g/dL — ABNORMAL LOW (ref 13.0–17.0)
MCH: 28.2 pg (ref 26.0–34.0)
MCHC: 32.3 g/dL (ref 30.0–36.0)
MCV: 87.4 fL (ref 80.0–100.0)
Platelets: 344 10*3/uL (ref 150–400)
RBC: 4.04 MIL/uL — ABNORMAL LOW (ref 4.22–5.81)
RDW: 16.9 % — ABNORMAL HIGH (ref 11.5–15.5)
WBC: 11.5 10*3/uL — ABNORMAL HIGH (ref 4.0–10.5)
nRBC: 0.2 % (ref 0.0–0.2)

## 2022-03-07 LAB — BLOOD GAS, VENOUS
Acid-Base Excess: 6.5 mmol/L — ABNORMAL HIGH (ref 0.0–2.0)
Bicarbonate: 35 mmol/L — ABNORMAL HIGH (ref 20.0–28.0)
O2 Saturation: 54.1 %
Patient temperature: 37
pCO2, Ven: 68 mmHg — ABNORMAL HIGH (ref 44–60)
pH, Ven: 7.32 (ref 7.25–7.43)
pO2, Ven: 38 mmHg (ref 32–45)

## 2022-03-07 LAB — BASIC METABOLIC PANEL
Anion gap: 10 (ref 5–15)
BUN: 24 mg/dL — ABNORMAL HIGH (ref 6–20)
CO2: 30 mmol/L (ref 22–32)
Calcium: 8.2 mg/dL — ABNORMAL LOW (ref 8.9–10.3)
Chloride: 94 mmol/L — ABNORMAL LOW (ref 98–111)
Creatinine, Ser: 6.6 mg/dL — ABNORMAL HIGH (ref 0.61–1.24)
GFR, Estimated: 9 mL/min — ABNORMAL LOW (ref >60)
Glucose, Bld: 108 mg/dL — ABNORMAL HIGH (ref 70–99)
Potassium: 4.4 mmol/L (ref 3.5–5.1)
Sodium: 134 mmol/L — ABNORMAL LOW (ref 135–145)

## 2022-03-07 LAB — SURGICAL PCR SCREEN
MRSA, PCR: NEGATIVE
Staphylococcus aureus: NEGATIVE

## 2022-03-07 LAB — HEPATITIS B SURFACE ANTIBODY, QUANTITATIVE: Hep B S AB Quant (Post): 538.4 m[IU]/mL (ref >9.9)

## 2022-03-07 SURGERY — DEBRIDEMENT, WOUND
Anesthesia: General | Site: Leg Lower | Laterality: Right

## 2022-03-07 MED ORDER — OXYCODONE HCL 5 MG PO TABS
5.0000 mg | ORAL_TABLET | Freq: Once | ORAL | Status: AC | PRN
  Administered 2022-03-07: 5 mg via ORAL

## 2022-03-07 MED ORDER — LIDOCAINE HCL (CARDIAC) PF 100 MG/5ML IV SOSY
PREFILLED_SYRINGE | INTRAVENOUS | Status: DC | PRN
  Administered 2022-03-07: 100 mg via INTRAVENOUS

## 2022-03-07 MED ORDER — FENTANYL CITRATE (PF) 100 MCG/2ML IJ SOLN
INTRAMUSCULAR | Status: DC | PRN
  Administered 2022-03-07 (×4): 25 ug via INTRAVENOUS

## 2022-03-07 MED ORDER — SODIUM CHLORIDE 0.9 % IV BOLUS
250.0000 mL | Freq: Once | INTRAVENOUS | Status: AC
  Administered 2022-03-07: 250 mL via INTRAVENOUS

## 2022-03-07 MED ORDER — 0.9 % SODIUM CHLORIDE (POUR BTL) OPTIME
TOPICAL | Status: DC | PRN
  Administered 2022-03-07: 1000 mL

## 2022-03-07 MED ORDER — FENTANYL CITRATE (PF) 100 MCG/2ML IJ SOLN
INTRAMUSCULAR | Status: AC
  Filled 2022-03-07: qty 2

## 2022-03-07 MED ORDER — JUVEN PO PACK
1.0000 | PACK | Freq: Two times a day (BID) | ORAL | Status: DC
  Administered 2022-03-09: 1 via ORAL

## 2022-03-07 MED ORDER — OXYCODONE HCL 5 MG/5ML PO SOLN: 5.0000 mg | Freq: Once | ORAL | Status: AC | PRN

## 2022-03-07 MED ORDER — SODIUM CHLORIDE 0.9 % IV SOLN: INTRAVENOUS | Status: DC | PRN

## 2022-03-07 MED ORDER — VASOPRESSIN 20 UNIT/ML IV SOLN
INTRAVENOUS | Status: DC | PRN
  Administered 2022-03-07 (×5): 1 [IU] via INTRAVENOUS

## 2022-03-07 MED ORDER — RENA-VITE PO TABS
1.0000 | ORAL_TABLET | Freq: Every day | ORAL | Status: DC
  Administered 2022-03-07 – 2022-03-18 (×8): 1 via ORAL
  Filled 2022-03-07 (×10): qty 1

## 2022-03-07 MED ORDER — FENTANYL CITRATE (PF) 100 MCG/2ML IJ SOLN
25.0000 ug | INTRAMUSCULAR | Status: DC | PRN
  Administered 2022-03-07 (×2): 50 ug via INTRAVENOUS

## 2022-03-07 MED ORDER — PROSOURCE PLUS PO LIQD
30.0000 mL | Freq: Two times a day (BID) | ORAL | Status: DC
  Administered 2022-03-08 – 2022-03-09 (×2): 30 mL via ORAL

## 2022-03-07 MED ORDER — HYDROMORPHONE HCL 1 MG/ML IJ SOLN: 0.5000 mg | INTRAMUSCULAR | Status: DC | PRN

## 2022-03-07 MED ORDER — KETOROLAC TROMETHAMINE 30 MG/ML IJ SOLN
INTRAMUSCULAR | Status: DC | PRN
  Administered 2022-03-07: 30 mg via INTRAVENOUS

## 2022-03-07 MED ORDER — PROPOFOL 10 MG/ML IV BOLUS
INTRAVENOUS | Status: DC | PRN
  Administered 2022-03-07: 160 mg via INTRAVENOUS

## 2022-03-07 MED ORDER — BUPIVACAINE-MELOXICAM ER 200-6 MG/7ML IJ SOLN
INTRAMUSCULAR | Status: AC
  Filled 2022-03-07: qty 1

## 2022-03-07 MED ORDER — PROPOFOL 10 MG/ML IV BOLUS
INTRAVENOUS | Status: AC
  Filled 2022-03-07: qty 40

## 2022-03-07 MED ORDER — PHENYLEPHRINE HCL (PRESSORS) 10 MG/ML IV SOLN
INTRAVENOUS | Status: DC | PRN
  Administered 2022-03-07: 160 ug via INTRAVENOUS
  Administered 2022-03-07 (×2): 80 ug via INTRAVENOUS
  Administered 2022-03-07: 160 ug via INTRAVENOUS

## 2022-03-07 MED ORDER — ONDANSETRON HCL 4 MG/2ML IJ SOLN
INTRAMUSCULAR | Status: DC | PRN
  Administered 2022-03-07: 4 mg via INTRAVENOUS

## 2022-03-07 MED ORDER — FENTANYL CITRATE (PF) 100 MCG/2ML IJ SOLN
INTRAMUSCULAR | Status: AC
  Administered 2022-03-07: 50 ug via INTRAVENOUS
  Filled 2022-03-07: qty 2

## 2022-03-07 MED ORDER — ALBUMIN HUMAN 25 % IV SOLN
25.0000 g | Freq: Once | INTRAVENOUS | Status: AC
  Administered 2022-03-07: 25 g via INTRAVENOUS
  Filled 2022-03-07: qty 100

## 2022-03-07 MED ORDER — OXYCODONE HCL 5 MG PO TABS
ORAL_TABLET | ORAL | Status: AC
  Filled 2022-03-07: qty 1

## 2022-03-07 SURGICAL SUPPLY — 38 items
APL PRP STRL LF DISP 70% ISPRP (MISCELLANEOUS) ×2
BNDG CMPR 5X6 CHSV STRCH STRL (GAUZE/BANDAGES/DRESSINGS) ×2
BNDG CMPR 75X21 PLY HI ABS (MISCELLANEOUS)
BNDG COHESIVE 6X5 TAN ST LF (GAUZE/BANDAGES/DRESSINGS) ×2 IMPLANT
BNDG ELASTIC 4X5.8 VLCR STR LF (GAUZE/BANDAGES/DRESSINGS) IMPLANT
BNDG GAUZE DERMACEA FLUFF 4 (GAUZE/BANDAGES/DRESSINGS) IMPLANT
BNDG GZE DERMACEA 4 6PLY (GAUZE/BANDAGES/DRESSINGS) ×2
CANISTER WOUND CARE 500ML ATS (WOUND CARE) ×2 IMPLANT
CHLORAPREP W/TINT 26 (MISCELLANEOUS) ×2 IMPLANT
DRAPE EXTREMITY 106X87X128.5 (DRAPES) IMPLANT
DRAPE INCISE IOBAN 66X45 STRL (DRAPES) ×2 IMPLANT
DRSG EMULSION OIL 3X3 NADH (GAUZE/BANDAGES/DRESSINGS) ×2 IMPLANT
DRSG VAC ATS LRG SENSATRAC (GAUZE/BANDAGES/DRESSINGS) ×2 IMPLANT
DRSG VAC ATS MED SENSATRAC (GAUZE/BANDAGES/DRESSINGS) ×2 IMPLANT
DRSG VAC GRANUFOAM MED (GAUZE/BANDAGES/DRESSINGS) IMPLANT
DRSG VERSA FOAM LRG 10X15 (GAUZE/BANDAGES/DRESSINGS) IMPLANT
ELECT REM PT RETURN 9FT ADLT (ELECTROSURGICAL) ×2
ELECTRODE REM PT RTRN 9FT ADLT (ELECTROSURGICAL) ×2 IMPLANT
GAUZE SPONGE 4X4 12PLY STRL (GAUZE/BANDAGES/DRESSINGS) IMPLANT
GAUZE STRETCH 2X75IN STRL (MISCELLANEOUS) IMPLANT
GLOVE BIO SURGEON STRL SZ7 (GLOVE) ×2 IMPLANT
GLOVE SURG SYN 8.0 (GLOVE) ×2 IMPLANT
GLOVE SURG SYN 8.0 PF PI (GLOVE) ×2 IMPLANT
GOWN STRL REUS W/ TWL LRG LVL3 (GOWN DISPOSABLE) ×4 IMPLANT
GOWN STRL REUS W/ TWL XL LVL3 (GOWN DISPOSABLE) ×2 IMPLANT
GOWN STRL REUS W/TWL LRG LVL3 (GOWN DISPOSABLE) ×4
GOWN STRL REUS W/TWL XL LVL3 (GOWN DISPOSABLE) ×2
KIT TURNOVER KIT A (KITS) ×2 IMPLANT
LABEL OR SOLS (LABEL) ×2 IMPLANT
MANIFOLD NEPTUNE II (INSTRUMENTS) ×2 IMPLANT
NS IRRIG 500ML POUR BTL (IV SOLUTION) ×2 IMPLANT
PACK EXTREMITY ARMC (MISCELLANEOUS) ×2 IMPLANT
PAD PREP 24X41 OB/GYN DISP (PERSONAL CARE ITEMS) ×2 IMPLANT
SOL PREP PVP 2OZ (MISCELLANEOUS) ×2
SOLUTION PREP PVP 2OZ (MISCELLANEOUS) ×2 IMPLANT
STOCKINETTE IMPERV 14X48 (MISCELLANEOUS) ×2 IMPLANT
TRAP FLUID SMOKE EVACUATOR (MISCELLANEOUS) ×2 IMPLANT
WATER STERILE IRR 500ML POUR (IV SOLUTION) ×2 IMPLANT

## 2022-03-07 NOTE — Anesthesia Postprocedure Evaluation (Signed)
Anesthesia Post Note  Patient: Adam Howell  Procedure(s) Performed: DEBRIDEMENT WOUND RIGHT LOWER EXTREMITY (Right) APPLICATION OF WOUND VAC (Right: Leg Lower)  Patient location during evaluation: PACU Anesthesia Type: General Level of consciousness: awake and alert Pain management: pain level controlled Vital Signs Assessment: post-procedure vital signs reviewed and stable Respiratory status: spontaneous breathing, nonlabored ventilation, respiratory function stable and patient connected to nasal cannula oxygen Cardiovascular status: blood pressure returned to baseline and stable Postop Assessment: no apparent nausea or vomiting Anesthetic complications: no   No notable events documented.   Last Vitals:  Vitals:   03/07/22 1030 03/07/22 1045  BP: 114/86 (!) 117/101  Pulse: 72 86  Resp: 19 (!) 27  Temp:  36.7 C  SpO2: 100% 96%    Last Pain:  Vitals:   03/07/22 1045  TempSrc:   PainSc: 10-Worst pain ever                 Ilene Qua

## 2022-03-07 NOTE — Transfer of Care (Signed)
Immediate Anesthesia Transfer of Care Note  Patient: Adam Howell  Procedure(s) Performed: DEBRIDEMENT WOUND RIGHT LOWER EXTREMITY (Right) APPLICATION OF WOUND VAC (Right: Leg Lower)  Patient Location: PACU  Anesthesia Type:General  Level of Consciousness: awake and alert   Airway & Oxygen Therapy: Patient Spontanous Breathing and Patient connected to face mask oxygen  Post-op Assessment: Report given to RN and Post -op Vital signs reviewed and stable  Post vital signs: Reviewed and stable  Last Vitals:  Vitals Value Taken Time  BP 100/68 03/07/22 1024  Temp 36.3 C 03/07/22 1024  Pulse 72 03/07/22 1028  Resp 16 03/07/22 1029  SpO2 100 % 03/07/22 1028  Vitals shown include unvalidated device data.  Last Pain:  Vitals:   03/07/22 1024  TempSrc:   PainSc: Asleep      Patients Stated Pain Goal: 3 (21/58/72 7618)  Complications: No notable events documented.

## 2022-03-07 NOTE — H&P (Signed)
[facility] History & Physical Update  The patient was interviewed and re-examined.  The patient's previous History and Physical has been reviewed and is unchanged.  There is no change in the plan of care. We plan to proceed with the scheduled procedure. Right lower extremity debridement, possible wound VAC.  Evaristo Bury, MD  03/07/2022, 9:07 AM

## 2022-03-07 NOTE — Anesthesia Procedure Notes (Signed)
Procedure Name: LMA Insertion Date/Time: 03/07/2022 9:38 AM  Performed by: Chanetta Marshall, CRNAPre-anesthesia Checklist: Patient identified, Emergency Drugs available, Suction available and Patient being monitored Patient Re-evaluated:Patient Re-evaluated prior to induction Oxygen Delivery Method: Circle system utilized Preoxygenation: Pre-oxygenation with 100% oxygen Induction Type: IV induction Ventilation: Mask ventilation without difficulty LMA: LMA inserted LMA Size: 4.0 Tube type: Oral Number of attempts: 1 Placement Confirmation: positive ETCO2, breath sounds checked- equal and bilateral and CO2 detector Tube secured with: Tape Dental Injury: Teeth and Oropharynx as per pre-operative assessment

## 2022-03-07 NOTE — Consult Note (Signed)
WOC aware of orders to see patient Tuesday for NPWT dressing change.   [place], [place], [place]

## 2022-03-07 NOTE — Progress Notes (Signed)
       CROSS COVER NOTE  NAME: Layla Gramm MRN: [id number] DOB : 1960-05-14   HPI/Events of Note   Nurse reported patient hypotensive with MAP of 60 and patient lethargic   Assessment and  Interventions   Assessment: Patient lying a 15 degrees, HOD elevated to 45.  Pupils 1 reactive . Lethargic but awakened with tactile stimulation and was able to tell me year month name and day of the weak  Likely patient oversedated post from procedure sedation/ narcotics in setting of ESRD,  stat VBG ordered Plan: Hypotension treated with 25 gm albumin and 250  VBG with normal pH, slightly elevated pCO2 at 68 - place on BIPAP likely for short time/overnight while sedation wears off Per [facility] patient can stay on 2C if BIPAP only overnight, therefore willl not transfer to progressive Pain med hydromorphone dosing decreased from 1mg  every 4 hours to 0.5 mg      Kathlene Cote NP [facility]

## 2022-03-07 NOTE — Anesthesia Preprocedure Evaluation (Addendum)
Anesthesia Evaluation  Patient identified by MRN, date of birth, ID band Patient awake    Reviewed: Allergy & Precautions, NPO status , Patient's Chart, lab work & pertinent test results  History of Anesthesia Complications Negative for: history of anesthetic complications  Airway Mallampati: III  TM Distance: >3 FB Neck ROM: full    Dental  (+) Poor Dentition   Pulmonary neg pulmonary ROS   Pulmonary exam normal        Cardiovascular hypertension, On Medications + CAD and +CHF  + dysrhythmias (on Eliquis) Atrial Fibrillation   ECHO (2021) IMPRESSIONS     1. Left ventricular ejection fraction, by estimation, is 35 to 40%. The  left ventricle has moderately decreased function. The left ventricle  demonstrates global hypokinesis. There is severe concentric left  ventricular hypertrophy. Left ventricular  diastolic function could not be evaluated.   2. Right ventricular systolic function is low normal. The right  ventricular size is normal. There is mildly elevated pulmonary artery  systolic pressure.   3. Left atrial size was moderately dilated.   4. Right atrial size was mildly dilated.   5. The mitral valve is normal in structure. No evidence of mitral valve  regurgitation.   6. The aortic valve is tricuspid. Aortic valve regurgitation is not  visualized.   7. The inferior vena cava is normal in size with greater than 50%  respiratory variability, suggesting right atrial pressure of 3 mmHg.     Neuro/Psych negative neurological ROS  negative psych ROS   GI/Hepatic negative GI ROS, Neg liver ROS,,,  Endo/Other  diabetes, Poorly Controlled, Insulin Dependent    Renal/GU CRF and DialysisRenal disease     Musculoskeletal   Abdominal   Peds  Hematology  (+) Blood dyscrasia, anemia   Anesthesia Other Findings Past Medical History: No date: Chronic kidney disease No date: Congestive heart failure (HCC) No  date: Diabetes mellitus without complication (HCC) No date: Hyperlipidemia No date: Hypertension  Past Surgical History: No date: WOUND DEBRIDEMENT; Left 01/16/2022: WOUND DEBRIDEMENT; Right     Comment:  Procedure: DEBRIDEMENT WOUND;  Surgeon: Katha Cabal, MD;  Location: [facility] ORS;  Service: Vascular;                Laterality: Right;  Wound vac placement  BMI    Body Mass Index: 31.87 kg/m      Reproductive/Obstetrics negative OB ROS                             Anesthesia Physical Anesthesia Plan  ASA: 4  Anesthesia Plan: General LMA   Post-op Pain Management: Ofirmev IV (intra-op)*, Toradol IV (intra-op)* and Dilaudid IV   Induction: Intravenous  PONV Risk Score and Plan: Dexamethasone, Ondansetron, Midazolam and Treatment may vary due to age or medical condition  Airway Management Planned: LMA  Additional Equipment:   Intra-op Plan:   Post-operative Plan: Extubation in OR  Informed Consent: I have reviewed the patients History and Physical, chart, labs and discussed the procedure including the risks, benefits and alternatives for the proposed anesthesia with the patient or authorized representative who has indicated his/her understanding and acceptance.     Dental Advisory Given  Plan Discussed with: Anesthesiologist, CRNA and Surgeon  Anesthesia Plan Comments: (Patient consented for risks of anesthesia including but not limited to:  - adverse reactions to medications - damage to  eyes, teeth, lips or other oral mucosa - nerve damage due to positioning  - sore throat or hoarseness - Damage to heart, brain, nerves, lungs, other parts of body or loss of life  Patient voiced understanding.)        Anesthesia Quick Evaluation

## 2022-03-07 NOTE — Progress Notes (Addendum)
  Progress Note   Patient: Adam Howell SAY:[id number] DOB: 1961/01/12 DOA: 03/05/2022     2 DOS: the patient was seen and examined on 03/07/2022   Brief hospital course:  Adam Howell is a 61 y.o. male with medical history significant for diabetes mellitus with complications of end-stage renal disease on hemodialysis (M/W/F), history of A-fib on Eliquis and amiodarone, HFpEF (EF 35 to 40% 2021), secondary hyperparathyroidism with chronic nonhealing right lower extremity wound for which he was hospitalized for debridement and IV antibiotics from 10/31 to 11/6.  He was sent to the ED by his home health nurse due to concerns for increased drainage and foul odor from the wound in spite of ongoing dressings.  Patient also reports increased pain of the lower leg.  Wound culture from back in October 11 Proteus mirabilis and Klebsiella oxytoca treated initially with Rocephin and vancomycin and transition to Keflex at discharge.  MRI right tib-fib suggesting myositis without osteomyelitis. Patient was started on Zosyn and vancomycin.  Patient was taken to the OR for debridement on 12/23.  Culture sent out.      Assessment and Plan: * Wound infection of chronic right lower extremity ulcer Patient had a debridement performed today.  Culture sent out.  Will continue current antibiotics with Zosyn and vancomycin until culture results available.  Chronic systolic CHF (congestive heart failure) (HCC) Condition stable without volume overload.  Type 2 diabetes mellitus with ESRD (end-stage renal disease) ([place]) Sliding scale insulin coverage  ESRD on dialysis [facility]) Patient does not have volume overload, followed by nephrology for scheduled dialysis.  Chronic Atrial fibrillation (HCC) Continue Eliquis and amiodarone  Anemia in chronic kidney disease Stable.  Hyperlipidemia associated with type 2 diabetes mellitus ([place]) Continue rosuvastatin  Obesity with BMI 31.87     Subjective:   Patient is very sleepy just came back from surgery.  Physical Exam: Vitals:   03/07/22 1045 03/07/22 1100 03/07/22 1106 03/07/22 1119  BP: (!) 117/101 98/88 120/73 114/64  Pulse: 86 85 85 79  Resp: (!) 27 (!) 23 20 20   Temp: 98 F (36.7 C)  97.8 F (36.6 C)   TempSrc:      SpO2: 96% 97% 100% 100%  Weight:      Height:       General exam: Appears calm and comfortable  Respiratory system: Clear to auscultation. Respiratory effort normal. Cardiovascular system: Irregular. No JVD, murmurs, rubs, gallops or clicks. No pedal edema. Gastrointestinal system: Abdomen is nondistended, soft and nontender. No organomegaly or masses felt. Normal bowel sounds heard. Central nervous system: Drowsy and oriented. No focal neurological deficits. Extremities: Symmetric 5 x 5 power. Skin: No rashes, lesions or ulcers   Data Reviewed:  Lab results reviewed.  Family Communication: None  Disposition: Status is: Inpatient Remains inpatient appropriate because: Severity of disease, IV antibiotics.  Planned Discharge Destination: Home with Home Health    Time spent: 35 minutes  Author: Sharen Hones, MD 03/07/2022 2:20 PM  For on call review [email or web address].

## 2022-03-07 NOTE — Progress Notes (Addendum)
Initial Nutrition Assessment  DOCUMENTATION CODES:   Obesity unspecified  INTERVENTION:  - Add Prosource BID.   - Add Juven BID.   - Add Renal MVI  NUTRITION DIAGNOSIS:   Increased nutrient needs related to wound healing as evidenced by estimated needs.  GOAL:   Patient will meet greater than or equal to 90% of their needs  MONITOR:   PO intake, Supplement acceptance  REASON FOR ASSESSMENT:   Consult Wound healing  ASSESSMENT:   61 y.o. male admits related to wound check. PMH includes: CKD, CHF, DM, HLD, HTN. Pt is currently receiving medical management related to wound infection of right lower extremity ulcer.  Meds reviewed:  sliding scale insulin, cozaar. Labs reviewed: Na low.   Pt ate 100% of his lunch today. Wt stable per record. RD will add Prosource BID and Juven BID for added protein and supplementation for wound healing. RD will continue to monitor.   NUTRITION - FOCUSED PHYSICAL EXAM:  Unable to assess, attempt at f/u.   Diet Order:   Diet Order             Diet heart healthy/carb modified Room service appropriate? Yes; Fluid consistency: Thin  Diet effective now                   EDUCATION NEEDS:   Not appropriate for education at this time  Skin:  Skin Assessment: Skin Integrity Issues: Skin Integrity Issues:: Diabetic Ulcer Diabetic Ulcer: right leg  Last BM:  03/05/22  Height:   Ht Readings from Last 1 Encounters:  03/05/22 6\' 3"  (1.905 m)    Weight:   Wt Readings from Last 1 Encounters:  03/05/22 115.7 kg    Ideal Body Weight:     BMI:  Body mass index is 31.87 kg/m.  Estimated Nutritional Needs:   Kcal:  2315-2890 kcals  Protein:  115-145 gm  Fluid:  >/= 2.3 L  Thalia Bloodgood, RD, LDN, CNSC.

## 2022-03-07 NOTE — Op Note (Signed)
    OPERATIVE NOTE   PROCEDURE: Irrigation and debridement of RIGHT lower extremity Placement of wound VAC  PRE-OPERATIVE DIAGNOSIS: Nonviable tissue and infection of  Right lower extremity; tendon exposure  POST-OPERATIVE DIAGNOSIS: Same as above  SURGEON: Jamesetta So, MD  ASSISTANT(S): None  ANESTHESIA: LMA  ESTIMATED BLOOD LOSS: 0 cc  FINDING(S): Purulent necrotic tissue of right lower leg, with dried tendon exposure  SPECIMEN(S):  Right lower extremity culture  INDICATIONS:   Adam Howell is a 61 y.o. male who presents with full thickness chronic wound to right pretibial area .  DESCRIPTION: After obtaining full informed written consent, the patient was brought back to the operating room and placed supine upon the operating table.  The patient received IV antibiotics prior to induction.  After obtaining adequate anesthesia, the patient was prepped and draped in the standard fashion for: right lower extremity wound.  I performed an excisional debridement  using a curette and Metzenbaum scissors to the right lower extremity wound to remove all clearly non-viable tissue.  The tissue was taken back to bleeding tissue that appeared viable. The tendon was dry and was not debrided. The debridement was performed with a curette and Metzenbaum scissors and encompassed an area of approximately 15cm x 10 cm x 0.2cm  .  The wound was irrigated copiously with warm saline.  A wound culture was sent. I used Zynrelef at the base of the wound for local anesthesia. A white sponge was used to cover visible tendon and a silver sponge was used over the remainder of the wound. Good seal and suction were obtained. The patient was then awakened from anesthesia and taken to the recovery room in stable condition having tolerated the procedure well.  COMPLICATIONS: none  CONDITION: stable  [name]  03/07/2022, 10:24 AM   This note was created with Dragon Medical transcription  system. Any errors in dictation are purely unintentional.

## 2022-03-08 ENCOUNTER — Encounter: Payer: Self-pay | Admitting: Vascular Surgery

## 2022-03-08 DIAGNOSIS — L089 Local infection of the skin and subcutaneous tissue, unspecified: Secondary | ICD-10-CM | POA: Diagnosis not present

## 2022-03-08 DIAGNOSIS — T148XXA Other injury of unspecified body region, initial encounter: Secondary | ICD-10-CM | POA: Diagnosis not present

## 2022-03-08 DIAGNOSIS — I959 Hypotension, unspecified: Secondary | ICD-10-CM | POA: Insufficient documentation

## 2022-03-08 DIAGNOSIS — I5022 Chronic systolic (congestive) heart failure: Secondary | ICD-10-CM | POA: Diagnosis not present

## 2022-03-08 DIAGNOSIS — G9341 Metabolic encephalopathy: Secondary | ICD-10-CM | POA: Insufficient documentation

## 2022-03-08 DIAGNOSIS — N186 End stage renal disease: Secondary | ICD-10-CM | POA: Diagnosis not present

## 2022-03-08 LAB — RENAL FUNCTION PANEL
Albumin: 2.6 g/dL — ABNORMAL LOW (ref 3.5–5.0)
Anion gap: 14 (ref 5–15)
BUN: 35 mg/dL — ABNORMAL HIGH (ref 6–20)
CO2: 26 mmol/L (ref 22–32)
Calcium: 8.4 mg/dL — ABNORMAL LOW (ref 8.9–10.3)
Chloride: 95 mmol/L — ABNORMAL LOW (ref 98–111)
Creatinine, Ser: 8.74 mg/dL — ABNORMAL HIGH (ref 0.61–1.24)
GFR, Estimated: 6 mL/min — ABNORMAL LOW (ref >60)
Glucose, Bld: 103 mg/dL — ABNORMAL HIGH (ref 70–99)
Phosphorus: 6.1 mg/dL — ABNORMAL HIGH (ref 2.5–4.6)
Potassium: 4.1 mmol/L (ref 3.5–5.1)
Sodium: 135 mmol/L (ref 135–145)

## 2022-03-08 LAB — CBC
HCT: 33 % — ABNORMAL LOW (ref 39.0–52.0)
Hemoglobin: 10.1 g/dL — ABNORMAL LOW (ref 13.0–17.0)
MCH: 27.2 pg (ref 26.0–34.0)
MCHC: 30.6 g/dL (ref 30.0–36.0)
MCV: 88.9 fL (ref 80.0–100.0)
Platelets: 331 10*3/uL (ref 150–400)
RBC: 3.71 MIL/uL — ABNORMAL LOW (ref 4.22–5.81)
RDW: 17.2 % — ABNORMAL HIGH (ref 11.5–15.5)
WBC: 11.8 10*3/uL — ABNORMAL HIGH (ref 4.0–10.5)
nRBC: 0 % (ref 0.0–0.2)

## 2022-03-08 LAB — GLUCOSE, CAPILLARY
Glucose-Capillary: 83 mg/dL (ref 70–99)
Glucose-Capillary: 91 mg/dL (ref 70–99)
Glucose-Capillary: 96 mg/dL (ref 70–99)
Glucose-Capillary: 100 mg/dL — ABNORMAL HIGH (ref 70–99)
Glucose-Capillary: 91 mg/dL (ref 70–99)

## 2022-03-08 MED ORDER — APIXABAN 5 MG PO TABS: 5.0000 mg | ORAL_TABLET | Freq: Two times a day (BID) | ORAL | Status: DC

## 2022-03-08 MED ORDER — ALBUMIN HUMAN 25 % IV SOLN
25.0000 g | Freq: Once | INTRAVENOUS | Status: AC
  Administered 2022-03-08: 25 g via INTRAVENOUS
  Filled 2022-03-08: qty 100

## 2022-03-08 MED ORDER — PENTAFLUOROPROP-TETRAFLUOROETH EX AERO
INHALATION_SPRAY | CUTANEOUS | Status: AC
  Filled 2022-03-08: qty 30

## 2022-03-08 MED ORDER — APIXABAN 2.5 MG PO TABS
2.5000 mg | ORAL_TABLET | Freq: Two times a day (BID) | ORAL | Status: DC
  Administered 2022-03-08 – 2022-03-19 (×15): 2.5 mg via ORAL
  Filled 2022-03-08 (×16): qty 1

## 2022-03-08 NOTE — Progress Notes (Signed)
Second NS bolus of 267ml given. 447ml/500ml total. Will give last 156ml bolus in 15 minutes as ordered. Pt stable, RN and CCHT at bedside.

## 2022-03-08 NOTE — Progress Notes (Signed)
  Progress Note   Patient: Adam Howell VEH:[id number] DOB: 09/19/1960 DOA: 03/05/2022     3 DOS: the patient was seen and examined on 03/08/2022   Brief hospital course:  Nellie Chevalier is a 61 y.o. male with medical history significant for diabetes mellitus with complications of end-stage renal disease on hemodialysis (M/W/F), history of A-fib on Eliquis and amiodarone, HFpEF (EF 35 to 40% 2021), secondary hyperparathyroidism with chronic nonhealing right lower extremity wound for which he was hospitalized for debridement and IV antibiotics from 10/31 to 11/6.  He was sent to the ED by his home health nurse due to concerns for increased drainage and foul odor from the wound in spite of ongoing dressings.  Patient also reports increased pain of the lower leg.  Wound culture from back in October 11 Proteus mirabilis and Klebsiella oxytoca treated initially with Rocephin and vancomycin and transition to Keflex at discharge.  MRI right tib-fib suggesting myositis without osteomyelitis. Patient was started on Zosyn and vancomycin.  Patient was taken to the OR for debridement on 12/23.  Culture sent out.      Assessment and Plan: Wound infection of chronic right lower extremity ulcer Blood culture negative. Patient had a debridement performed 12/23.  Gram stain shows gram negative rods, culture pending.  Continue current antibiotic with Zosyn and vancomycin until cultures available  Acute metabolic encephalopathy. Hypotension. Patient had a prolonged altered mental status post op yesterday.  Blood pressure was low overnight, received a fluid bolus.  Mental status finally improved today.  This is probably due to end-stage renal disease, with a slow metabolism of anesthesia medicine.  Condition has resolved.   Chronic systolic CHF (congestive heart failure) (HCC) Condition stable without volume overload.   Type 2 diabetes mellitus with ESRD (end-stage renal disease) ([place]) Sliding  scale insulin coverage   ESRD on dialysis [facility]) Patient does not have volume overload, followed by nephrology for scheduled dialysis.   Chronic Atrial fibrillation (HCC) Continue Eliquis and amiodarone   Anemia in chronic kidney disease Stable.   Hyperlipidemia associated with type 2 diabetes mellitus ([place]) Continue rosuvastatin   Obesity with BMI 31.87      Subjective:  Patient no longer has any confusion today, denies any short of breath.  Physical Exam: Vitals:   03/08/22 1204 03/08/22 1207 03/08/22 1214 03/08/22 1247  BP: (!) 75/45 (!) 72/55 (!) 71/49 (!) 77/53  Pulse: 82 78  78  Resp:  13 17 16   Temp: 98 F (36.7 C)     TempSrc: Axillary     SpO2: 100% 100%  100%  Weight:   114 kg   Height:       General exam: Appears calm and comfortable  Respiratory system: Clear to auscultation. Respiratory effort normal. Cardiovascular system: S1 & S2 heard, RRR. No JVD, murmurs, rubs, gallops or clicks. No pedal edema. Gastrointestinal system: Abdomen is nondistended, soft and nontender. No organomegaly or masses felt. Normal bowel sounds heard. Central nervous system: Alert and oriented x3. No focal neurological deficits. Extremities: Symmetric 5 x 5 power. Skin: No rashes, lesions or ulcers Psychiatry: Judgement and insight appear normal. Mood & affect appropriate.   Data Reviewed:  Lab results reviewed.  Family Communication: None  Disposition: Status is: Inpatient Remains inpatient appropriate because: Severity of disease, IV antibiotics.  Planned Discharge Destination: Home with Home Health    Time spent: 35 minutes  Author: Sharen Hones, MD 03/08/2022 2:26 PM  For on call review [email or web address].

## 2022-03-08 NOTE — Progress Notes (Signed)
Candiss Norse, MD performed visual assessment of patient before bringing to [facility] for HD treatment. [place] for patient to come to [facility]. HD orders modified.

## 2022-03-08 NOTE — Progress Notes (Signed)
Pt HD ended after 28 minutes d/t machine (tmp) alarms. After rinseback patient sbp remains <80. Candiss Norse, MD notified. Pt HD treatment not to be restarted today. Pt alert, has no c/o. Report to primary RN. [id number] End: 1143 No fluid removed 758ml NS given 10.9L BVP 114kg bed post hd weight No meds given

## 2022-03-08 NOTE — Progress Notes (Signed)
Pre hd bed weight not accurate d/t soft brace on patient right leg. Pt 02 sat 87% on RA. Placed on [facility]. Currently, 100%. Candiss Norse, MD present at bedside. Safety maintained, continue to monitor.

## 2022-03-08 NOTE — Progress Notes (Signed)
Pt arrival to [facility] with sbp <90 and flat affect. Primary RN Candiss Norse, MD notified. No fluid removal for HD and NS boluses ordered. Pt alert, oriented, otherwise stable. RN at bedside. Safety maintained, will continue to monitor.

## 2022-03-08 NOTE — Progress Notes (Signed)
Transport delay for patient to [facility].

## 2022-03-08 NOTE — Progress Notes (Signed)
[facility], [place] 03/08/22  Subjective:   LOS: 3  Patient known to our practice from previous admissions.  Patient admitted for worsening right lower extremity ulcer.  Currently getting IV broad-spectrum antibiotics.   Patient underwent irrigation and debridement of right lower extremity and placement of wound VAC. Very somnolent this morning but then became arousable and was able to follow commands. Hemodialysis today via hypotensive therefore given IV fluid boluses.   HEMODIALYSIS FLOWSHEET:  Blood Flow Rate (mL/min): 350 mL/min Arterial Pressure (mmHg): -160 mmHg Venous Pressure (mmHg): 40 mmHg TMP (mmHg): -6 mmHg Ultrafiltration Rate (mL/min): 0 mL/min Dialysate Flow Rate (mL/min): 300 ml/min Dialysis Fluid Bolus: Normal Saline Bolus Amount (mL): 200 mL    Objective:  Vital signs in last 24 hours:  Temp:  [97.6 F (36.4 C)-98.5 F (36.9 C)] 98 F (36.7 C) (12/24 1204) Pulse Rate:  [72-91] 78 (12/24 1207) Resp:  [13-20] 17 (12/24 1214) BP: (71-130)/(45-112) 71/49 (12/24 1214) SpO2:  [91 %-100 %] 100 % (12/24 1207) FiO2 (%):  [21 %] 21 % (12/23 2350) Weight:  [245 kg-114 kg] 114 kg (12/24 1214)  Weight change:  Filed Weights   03/05/22 1312 03/08/22 1104 03/08/22 1214  Weight: 115.7 kg 113 kg 114 kg    Intake/Output:    Intake/Output Summary (Last 24 hours) at 03/08/2022 1238 Last data filed at 03/08/2022 1143 Gross per 24 hour  Intake 640.06 ml  Output 0 ml  Net 640.06 ml      Physical Exam: General: Chronically ill-appearing, laying in the bed  HEENT Moist oral mucous membranes  Pulm/lungs Normal breathing effort  CVS/Heart No rub  Abdomen:  Soft, nontender, nondistended  Extremities: Chronic lower extremity wound, trace edema  Neurologic: Alert, able to follow simple commands.  Skin: No acute rashes  Access: AV fistula       Basic Metabolic Panel:  Recent Labs  Lab 03/05/22 1323 03/07/22 0736 03/08/22 0915   NA 133* 134* 135  K 3.2* 4.4 4.1  CL 89* 94* 95*  CO2 30 30 26   GLUCOSE 102* 108* 103*  BUN 26* 24* 35*  CREATININE 7.14* 6.60* 8.74*  CALCIUM 8.8* 8.2* 8.4*  PHOS  --   --  6.1*      CBC: Recent Labs  Lab 03/05/22 1323 03/07/22 0736 03/08/22 0947  WBC 9.1 11.5* 11.8*  NEUTROABS 7.0  --   --   HGB 12.9* 11.4* 10.1*  HCT 40.5 35.3* 33.0*  MCV 87.5 87.4 88.9  PLT 350 344 331       Lab Results  Component Value Date   HEPBSAG NON REACTIVE 03/06/2022      Microbiology:  Recent Results (from the past 240 hour(s))  Culture, blood (routine x 2)     Status: None (Preliminary result)   Collection Time: 03/05/22  7:43 PM   Specimen: BLOOD  Result Value Ref Range Status   Specimen Description BLOOD BLOOD RIGHT ARM  Final   Special Requests   Final    BOTTLES DRAWN AEROBIC AND ANAEROBIC Blood Culture results may not be optimal due to an inadequate volume of blood received in culture bottles   Culture   Final    NO GROWTH 3 DAYS Performed at [facility], [address] [place], [place] [address]    Report Status PENDING  Incomplete  Surgical PCR screen     Status: None   Collection Time: 03/06/22 11:31 PM   Specimen: Nasal Mucosa; Nasal Swab  Result Value  Ref Range Status   MRSA, PCR NEGATIVE NEGATIVE Final   Staphylococcus aureus NEGATIVE NEGATIVE Final    Comment: (NOTE) The Xpert SA Assay (FDA approved for NASAL specimens in patients 28 years of age and older), is one component of a comprehensive surveillance program. It is not intended to diagnose infection nor to guide or monitor treatment. Performed at [facility], [address] [place], [place] [address]   Aerobic/Anaerobic Culture w Gram Stain (surgical/deep wound)     Status: None (Preliminary result)   Collection Time: 03/07/22  9:50 AM   Specimen: Wound  Result Value Ref Range Status   Specimen Description   Final    WOUND Performed at [facility], [address], [place], [place] [address]    Special Requests   Final    right lower extremity wound Performed at [facility], [place], [place] [address]    Gram Stain   Final    FEW GRAM NEGATIVE RODS RARE WBC PRESENT, PREDOMINANTLY PMN    Culture   Final    FEW GRAM NEGATIVE RODS SUSCEPTIBILITIES TO FOLLOW Performed at [facility], [place] [address], [place], [place] [address]    Report Status PENDING  Incomplete    Coagulation Studies: Recent Labs    03/05/22 1943  LABPROT 15.1  INR 1.2     Urinalysis: No results for input(s): "COLORURINE", "LABSPEC", "PHURINE", "GLUCOSEU", "HGBUR", "BILIRUBINUR", "KETONESUR", "PROTEINUR", "UROBILINOGEN", "NITRITE", "LEUKOCYTESUR" in the last 72 hours.  Invalid input(s): "APPERANCEUR"    Imaging: No results found.   Medications:    albumin human     anticoagulant sodium citrate     piperacillin-tazobactam (ZOSYN)  IV Stopped (03/08/22 0458)   vancomycin Stopped (03/06/22 1350)    (feeding supplement) PROSource Plus  30 mL Oral BID BM   amiodarone  200 mg Oral BID   apixaban  2.5 mg Oral BID   bumetanide  1 mg Oral BID   carvedilol  3.125 mg Oral BID WC   Chlorhexidine Gluconate Cloth  6 each Topical Q0600   gabapentin  300 mg Oral BID   insulin aspart  0-5 Units Subcutaneous QHS   insulin aspart  0-6 Units Subcutaneous TID WC   isosorbide mononitrate  30 mg Oral Daily   losartan  25 mg Oral Daily   morphine  30 mg Oral Q12H   multivitamin  1 tablet Oral QHS   nutrition supplement (JUVEN)  1 packet Oral BID BM   rosuvastatin  40 mg Oral Daily   sertraline  50 mg Oral Daily   sevelamer carbonate  1,600 mg Oral TID with meals   acetaminophen **OR** acetaminophen, alteplase, anticoagulant sodium citrate, heparin, HYDROcodone-acetaminophen, HYDROmorphone (DILAUDID) injection, HYDROmorphone (DILAUDID) injection, lidocaine (PF), lidocaine-prilocaine, ondansetron **OR** ondansetron (ZOFRAN) IV,  pentafluoroprop-tetrafluoroeth, tiZANidine  Assessment/ Plan:  61 y.o. male with diabetes with complications, ESRD, atrial fibrillation requiring Eliquis, amiodarone, congestive heart failure, secondary hyperparathyroidism, chronic nonhealing right lower extremity wound was admitted on 03/05/2022 for  Principal Problem:   Wound infection of chronic right lower extremity ulcer Active Problems:   Anemia in chronic kidney disease   ESRD on dialysis ([place])   Obesity (BMI 30-39.9)   Type 2 diabetes mellitus with ESRD (end-stage renal disease) (HCC)   Chronic atrial fibrillation with RVR (HCC)   Hyperlipidemia associated with type 2 diabetes mellitus (HCC)   Chronic systolic CHF (congestive heart failure) (HCC)   Hypokalemia   Hyponatremia  Unspecified open wound, right  lower leg, initial encounter [S81.801A] Necrotizing fasciitis of lower leg ([place]) [M72.6]  #. ESRD [facility]/MWF/left aVF   Plan for hemodialysis today due to holiday schedule, then next treatment on Wednesday  #. Anemia of CKD  Lab Results  Component Value Date   HGB 10.1 (L) 03/08/2022   Low dose EPO with HD for hemoglobin less than 10  #. Secondary hyperparathyroidism of renal origin N 25.81   No results found for: "PTH" Lab Results  Component Value Date   PHOS 6.1 (H) 03/08/2022   Monitor calcium and phos level during this admission   #.  Nonhealing right lower extremity ulcer with wound infection -Broad-spectrum antibiotics -Irrigation and debridement, placement of wound VAC on 03/07/2022.   LOS: [place] 12/24/202312:38 PM  [facility], [place]

## 2022-03-08 NOTE — Progress Notes (Signed)
Pre hd rn assessment 

## 2022-03-08 NOTE — TOC Initial Note (Addendum)
Transition of Care [facility]) - Initial/Assessment Note    Patient Details  Name: Adam Howell MRN: [id number] Date of Birth: 1960-08-18  [facility] CM/SW Contact:    Magnus Ivan, LCSW Phone Number: 03/08/2022, 10:20 AM  Clinical Narrative:                 Attempted call into patient's room for high risk assessment. No answer. Per chart review, patient has HD today.  Patient known to [facility] for recent admission.  Patient lives at home with his daughter who provides transportation. PCP is [facility]. [place] is Paediatric nurse on [employer].  Patient was recently set up with [facility], spoke with Corene Cornea who stated patient's cert period has expired. HH will need to be set up again if needed prior to DC.  Patient has a 3in1, [place], and was recently set up with a wound vac through [facility].  TOC will follow for needs.   11:08- Per Corene Cornea with [facility], they may be able to accept patient back depending on when he DC.    Expected Discharge Plan: [place] Barriers to Discharge: Continued Medical Work up   Patient Goals and CMS Choice            Expected Discharge Plan and Services                                              Prior Living Arrangements/Services   Lives with:: Adult Children Patient language and need for interpreter reviewed:: Yes        Need for Family Participation in Patient Care: Yes (Comment) Care giver support system in place?: Yes (comment) Current home services: DME Criminal Activity/Legal Involvement Pertinent to Current Situation/Hospitalization: No - Comment as needed  Activities of Daily Living Home Assistive Devices/Equipment: Walker (specify type) ADL Screening (condition at time of admission) Patient's cognitive ability adequate to safely complete daily activities?: Yes Is the patient deaf or have difficulty hearing?: No Does the patient have difficulty seeing, even when wearing  glasses/contacts?: No Does the patient have difficulty concentrating, remembering, or making decisions?: No Patient able to express need for assistance with ADLs?: Yes Does the patient have difficulty dressing or bathing?: No Independently performs ADLs?: Yes (appropriate for developmental age) Does the patient have difficulty walking or climbing stairs?: No Weakness of Legs: None Weakness of Arms/Hands: None  Permission Sought/Granted                  Emotional Assessment              Admission diagnosis:  Unspecified open wound, right lower leg, initial encounter [S81.801A] Necrotizing fasciitis of lower leg ([place]) [M72.6] Patient Active Problem List   Diagnosis Date Noted   Hypokalemia 03/07/2022   Hyponatremia 12/45/8099   Chronic systolic CHF (congestive heart failure) ([place]) 03/06/2022   Wound infection of chronic right lower extremity ulcer 01/13/2022   Controlled substance agreement signed 04/10/2021   Ulcer of right lower extremity ([place]) 02/02/2020   Other thrombophilia ([place]) 01/19/2020   Chronic atrial fibrillation with RVR ([place]) 11/11/2019   Hyperlipidemia associated with type 2 diabetes mellitus ([place]) 11/11/2019   Erectile dysfunction 11/11/2019   Heart failure with reduced ejection fraction ([place]) 10/26/2019   Type 2 diabetes mellitus with ESRD (end-stage renal disease) ([place]) 10/04/2019   Chronic pain syndrome 10/04/2019  Insomnia 10/04/2019   Atherosclerotic heart disease of native coronary artery without angina pectoris 09/01/2019   Hypertensive heart and kidney disease with heart failure and chronic kidney disease stage V ([place]) 09/01/2019   Obesity (BMI 30-39.9) 09/01/2019   Anemia in chronic kidney disease 07/25/2019   ESRD on dialysis [facility]) 07/25/2019   Secondary hyperparathyroidism of renal origin ([place]) 07/25/2019   PCP:  Venita Lick, NP Pharmacy:   [facility] [address] ([address]), [place]) [place] [address] Phone: [phone] Fax: [phone]     Social Determinants of Health ([place]) Social History: [place]: Food Insecurity Present (03/06/2022)  Housing: Low Risk  (03/06/2022)  Transportation Needs: No Transportation Needs (03/06/2022)  Utilities: Not At Risk (03/06/2022)  Alcohol Screen: Low Risk  (11/25/2021)  Depression (PHQ2-9): Low Risk  (11/25/2021)  Financial Resource Strain: Low Risk  (11/25/2021)  Physical Activity: Insufficiently Active (11/25/2021)  Social Connections: Moderately Isolated (11/25/2021)  Stress: No Stress Concern Present (11/25/2021)  Tobacco Use: Low Risk  (03/08/2022)   SDOH Interventions:     Readmission Risk Interventions     No data to display

## 2022-03-08 NOTE — Progress Notes (Signed)
Post hd rn assessment 

## 2022-03-08 NOTE — Progress Notes (Signed)
Pt multiple machine alarms including tmp. Machine forced a rinse back. Pt sbp<80. Candiss Norse, MD notified. Pt responds to voice, safety  maintained.

## 2022-03-09 DIAGNOSIS — L089 Local infection of the skin and subcutaneous tissue, unspecified: Secondary | ICD-10-CM | POA: Diagnosis not present

## 2022-03-09 DIAGNOSIS — T148XXA Other injury of unspecified body region, initial encounter: Secondary | ICD-10-CM | POA: Diagnosis not present

## 2022-03-09 DIAGNOSIS — N186 End stage renal disease: Secondary | ICD-10-CM | POA: Diagnosis not present

## 2022-03-09 DIAGNOSIS — I5022 Chronic systolic (congestive) heart failure: Secondary | ICD-10-CM | POA: Diagnosis not present

## 2022-03-09 LAB — GLUCOSE, CAPILLARY
Glucose-Capillary: 76 mg/dL (ref 70–99)
Glucose-Capillary: 85 mg/dL (ref 70–99)
Glucose-Capillary: 83 mg/dL (ref 70–99)
Glucose-Capillary: 79 mg/dL (ref 70–99)

## 2022-03-09 NOTE — Progress Notes (Signed)
Patient taken off of cpap and placed on 2l [place] for the day

## 2022-03-09 NOTE — Progress Notes (Signed)
Adam Howell, [place] 03/09/22  Subjective:   LOS: 4  Patient known to our practice from previous admissions.  Patient admitted for worsening right lower extremity ulcer.  Currently getting IV broad-spectrum antibiotics.   Patient underwent irrigation and debridement of right lower extremity and placement of wound VAC.  Very somnolent this morning but was able to eat breakfast. Wound VAC in place.   Objective:  Vital signs in last 24 hours:  Temp:  [98 F (36.7 C)-98.9 F (37.2 C)] 98.7 F (37.1 C) (12/25 0807) Pulse Rate:  [78-88] 81 (12/25 0807) Resp:  [13-18] 16 (12/25 0807) BP: (71-112)/(45-91) 103/60 (12/25 0807) SpO2:  [94 %-100 %] 100 % (12/25 0807) FiO2 (%):  [21 %] 21 % (12/25 0013) Weight:  [166 kg] 114 kg (12/24 1214)  Weight change:  Filed Weights   03/05/22 1312 03/08/22 1104 03/08/22 1214  Weight: 115.7 kg 113 kg 114 kg    Intake/Output:    Intake/Output Summary (Last 24 hours) at 03/09/2022 1158 Last data filed at 03/09/2022 0549 Gross per 24 hour  Intake 349.94 ml  Output --  Net 349.94 ml      Physical Exam: General: Chronically ill-appearing, laying in the bed  HEENT Moist oral mucous membranes  Pulm/lungs Normal breathing effort  CVS/Heart No rub  Abdomen:  Soft, nontender, nondistended  Extremities: Chronic right lower extremity wound with wound VAC, trace edema  Neurologic: Somnolent this morning  Skin: No acute rashes  Access: AV fistula       Basic Metabolic Panel:  Recent Labs  Lab 03/05/22 1323 03/07/22 0736 03/08/22 0915  NA 133* 134* 135  K 3.2* 4.4 4.1  CL 89* 94* 95*  CO2 30 30 26   GLUCOSE 102* 108* 103*  BUN 26* 24* 35*  CREATININE 7.14* 6.60* 8.74*  CALCIUM 8.8* 8.2* 8.4*  PHOS  --   --  6.1*      CBC: Recent Labs  Lab 03/05/22 1323 03/07/22 0736 03/08/22 0947  WBC 9.1 11.5* 11.8*  NEUTROABS 7.0  --   --   HGB 12.9* 11.4* 10.1*  HCT 40.5 35.3* 33.0*  MCV 87.5 87.4 88.9   PLT 350 344 331       Lab Results  Component Value Date   HEPBSAG NON REACTIVE 03/06/2022      Microbiology:  Recent Results (from the past 240 hour(s))  Culture, blood (routine x 2)     Status: None (Preliminary result)   Collection Time: 03/05/22  7:43 PM   Specimen: BLOOD  Result Value Ref Range Status   Specimen Description BLOOD BLOOD RIGHT ARM  Final   Special Requests   Final    BOTTLES DRAWN AEROBIC AND ANAEROBIC Blood Culture results may not be optimal due to an inadequate volume of blood received in culture bottles   Culture   Final    NO GROWTH 4 DAYS Performed at [facility], [address], [place], [place] [address]    Report Status PENDING  Incomplete  Surgical PCR screen     Status: None   Collection Time: 03/06/22 11:31 PM   Specimen: Nasal Mucosa; Nasal Swab  Result Value Ref Range Status   MRSA, PCR NEGATIVE NEGATIVE Final   Staphylococcus aureus NEGATIVE NEGATIVE Final    Comment: (NOTE) The Xpert SA Assay (FDA approved for NASAL specimens in patients 66 years of age and older), is one component of a comprehensive surveillance program. It is not intended to diagnose infection nor to  guide or monitor treatment. Performed at [facility], [address], [place], [place] [address]   Aerobic/Anaerobic Culture w Gram Stain (surgical/deep wound)     Status: None (Preliminary result)   Collection Time: 03/07/22  9:50 AM   Specimen: Wound  Result Value Ref Range Status   Specimen Description   Final    WOUND Performed at [facility], [address] [place], [place] [address]    Special Requests   Final    right lower extremity wound Performed at [facility], [place], [place] [address]    Gram Stain   Final    FEW GRAM NEGATIVE RODS RARE WBC PRESENT, PREDOMINANTLY PMN    Culture   Final    FEW GRAM NEGATIVE RODS CULTURE REINCUBATED FOR BETTER GROWTH Performed at [facility], [place] [address], [place], [place] [address]    Report Status PENDING  Incomplete    Coagulation Studies: No results for input(s): "LABPROT", "INR" in the last 72 hours.   Urinalysis: No results for input(s): "COLORURINE", "LABSPEC", "PHURINE", "GLUCOSEU", "HGBUR", "BILIRUBINUR", "KETONESUR", "PROTEINUR", "UROBILINOGEN", "NITRITE", "LEUKOCYTESUR" in the last 72 hours.  Invalid input(s): "APPERANCEUR"    Imaging: No results found.   Medications:    anticoagulant sodium citrate     piperacillin-tazobactam (ZOSYN)  IV 2.25 g (03/09/22 0443)    (feeding supplement) PROSource Plus  30 mL Oral BID BM   amiodarone  200 mg Oral BID   apixaban  2.5 mg Oral BID   bumetanide  1 mg Oral BID   carvedilol  3.125 mg Oral BID WC   Chlorhexidine Gluconate Cloth  6 each Topical Q0600   gabapentin  300 mg Oral BID   insulin aspart  0-5 Units Subcutaneous QHS   insulin aspart  0-6 Units Subcutaneous TID WC   isosorbide mononitrate  30 mg Oral Daily   losartan  25 mg Oral Daily   morphine  30 mg Oral Q12H   multivitamin  1 tablet Oral QHS   nutrition supplement (JUVEN)  1 packet Oral BID BM   rosuvastatin  40 mg Oral Daily   sertraline  50 mg Oral Daily   sevelamer carbonate  1,600 mg Oral TID with meals   acetaminophen **OR** acetaminophen, alteplase, anticoagulant sodium citrate, heparin, HYDROcodone-acetaminophen, HYDROmorphone (DILAUDID) injection, HYDROmorphone (DILAUDID) injection, lidocaine (PF), lidocaine-prilocaine, ondansetron **OR** ondansetron (ZOFRAN) IV, pentafluoroprop-tetrafluoroeth, tiZANidine  Assessment/ Plan:  61 y.o. male with diabetes with complications, ESRD, atrial fibrillation requiring Eliquis, amiodarone, congestive heart failure, secondary hyperparathyroidism, chronic nonhealing right lower extremity wound was admitted on 03/05/2022 for  Principal Problem:   Wound infection of chronic right lower extremity ulcer Active Problems:   Anemia in chronic kidney  disease   ESRD on dialysis ([place])   Obesity (BMI 30-39.9)   Type 2 diabetes mellitus with ESRD (end-stage renal disease) (HCC)   Chronic atrial fibrillation with RVR (HCC)   Hyperlipidemia associated with type 2 diabetes mellitus (HCC)   Chronic systolic CHF (congestive heart failure) (HCC)   Hypokalemia   Hyponatremia   Acute metabolic encephalopathy   Hypotension  Unspecified open wound, right lower leg, initial encounter [S81.801A] Necrotizing fasciitis of lower leg ([place]) [M72.6]  #. ESRD [facility]/MWF/left aVF   Plan for next treatment on Wednesday  #. Anemia of CKD  Lab Results  Component Value Date   HGB 10.1 (L) 03/08/2022   Low dose EPO with HD for hemoglobin less than 10  #. Secondary hyperparathyroidism  of renal origin N 25.81   No results found for: "PTH" Lab Results  Component Value Date   PHOS 6.1 (H) 03/08/2022   Monitor calcium and phos level during this admission   #.  Nonhealing right lower extremity ulcer with wound infection -Broad-spectrum antibiotics -Irrigation and debridement, placement of wound VAC on 03/07/2022.   LOS: [place] 12/25/202311:58 AM  [facility] [place], [place]

## 2022-03-09 NOTE — Progress Notes (Signed)
  Progress Note   Patient: Adam Howell QQV:[id number] DOB: 1961-01-17 DOA: 03/05/2022     4 DOS: the patient was seen and examined on 03/09/2022   Brief hospital course:  Rogerio Boutelle is a 61 y.o. male with medical history significant for diabetes mellitus with complications of end-stage renal disease on hemodialysis (M/W/F), history of A-fib on Eliquis and amiodarone, HFpEF (EF 35 to 40% 2021), secondary hyperparathyroidism with chronic nonhealing right lower extremity wound for which he was hospitalized for debridement and IV antibiotics from 10/31 to 11/6.  He was sent to the ED by his home health nurse due to concerns for increased drainage and foul odor from the wound in spite of ongoing dressings.  Patient also reports increased pain of the lower leg.  Wound culture from back in October 11 Proteus mirabilis and Klebsiella oxytoca treated initially with Rocephin and vancomycin and transition to Keflex at discharge.  MRI right tib-fib suggesting myositis without osteomyelitis. Patient was started on Zosyn and vancomycin.  Patient was taken to the OR for debridement on 12/23.  Culture sent out.      Assessment and Plan:  Wound infection of chronic right lower extremity ulcer Blood culture negative. Patient had a debridement performed 12/23.  Gram stain shows gram negative rods, culture only has gram-negative rods.  Discontinue vancomycin, continue Zosyn for now.  Acute metabolic encephalopathy. Hypotension. Patient had a prolonged altered mental status post op due to end-stage renal disease.  Blood pressure was low overnight, received fluid bolus.  Mental status finally improved.     Chronic systolic CHF (congestive heart failure) (HCC) Condition stable without volume overload.   Type 2 diabetes mellitus with ESRD (end-stage renal disease) ([place]) Sliding scale insulin coverage   ESRD on dialysis [facility]) Patient does not have volume overload, followed by nephrology for  scheduled dialysis.   Chronic Atrial fibrillation (HCC) Continue Eliquis and amiodarone   Anemia in chronic kidney disease Stable.   Hyperlipidemia associated with type 2 diabetes mellitus ([place]) Continue rosuvastatin   Obesity with BMI 31.87     Subjective:  Patient doing better today, he was on 2 L oxygen, but no documented hypoxemia.  Physical Exam: Vitals:   03/08/22 1938 03/09/22 0013 03/09/22 0446 03/09/22 0807  BP: (!) 112/91  106/65 103/60  Pulse: 80  88 81  Resp: 18  16 16   Temp: 98.1 F (36.7 C)  98.9 F (37.2 C) 98.7 F (37.1 C)  TempSrc: Oral  Oral   SpO2: 100% 98% 94% 100%  Weight:      Height:       General exam: Appears calm and comfortable  Respiratory system: Clear to auscultation. Respiratory effort normal. Cardiovascular system: S1 & S2 heard, RRR. No JVD, murmurs, rubs, gallops or clicks. No pedal edema. Gastrointestinal system: Abdomen is nondistended, soft and nontender. No organomegaly or masses felt. Normal bowel sounds heard. Central nervous system: Alert and oriented x3. No focal neurological deficits. Extremities: Symmetric 5 x 5 power. Skin: No rashes, lesions or ulcers Psychiatry: Judgement and insight appear normal. Mood & affect appropriate.   Data Reviewed:  Lab results reviewed.  Family Communication: daughter updated  Disposition: Status is: Inpatient Remains inpatient appropriate because: severity of disease, iv treatment  Planned Discharge Destination: Home with Home Health    Time spent: 35 minutes  Author: Sharen Hones, MD 03/09/2022 11:22 AM  For on call review [email or web address].

## 2022-03-10 ENCOUNTER — Ambulatory Visit: Payer: Medicare Other | Admitting: Internal Medicine

## 2022-03-10 ENCOUNTER — Inpatient Hospital Stay: Payer: Medicare Other

## 2022-03-10 ENCOUNTER — Encounter: Payer: Self-pay | Admitting: Internal Medicine

## 2022-03-10 DIAGNOSIS — N186 End stage renal disease: Secondary | ICD-10-CM | POA: Diagnosis not present

## 2022-03-10 DIAGNOSIS — I482 Chronic atrial fibrillation, unspecified: Secondary | ICD-10-CM

## 2022-03-10 DIAGNOSIS — I5022 Chronic systolic (congestive) heart failure: Secondary | ICD-10-CM | POA: Diagnosis not present

## 2022-03-10 DIAGNOSIS — R569 Unspecified convulsions: Secondary | ICD-10-CM

## 2022-03-10 DIAGNOSIS — L089 Local infection of the skin and subcutaneous tissue, unspecified: Secondary | ICD-10-CM | POA: Diagnosis not present

## 2022-03-10 DIAGNOSIS — R4182 Altered mental status, unspecified: Secondary | ICD-10-CM | POA: Diagnosis not present

## 2022-03-10 DIAGNOSIS — G9341 Metabolic encephalopathy: Secondary | ICD-10-CM

## 2022-03-10 DIAGNOSIS — T148XXA Other injury of unspecified body region, initial encounter: Secondary | ICD-10-CM | POA: Diagnosis not present

## 2022-03-10 LAB — HEPATITIS B E ANTIBODY: Hep B E Ab: NEGATIVE

## 2022-03-10 LAB — BASIC METABOLIC PANEL
Anion gap: 15 (ref 5–15)
BUN: 53 mg/dL — ABNORMAL HIGH (ref 6–20)
CO2: 25 mmol/L (ref 22–32)
Calcium: 8.6 mg/dL — ABNORMAL LOW (ref 8.9–10.3)
Chloride: 93 mmol/L — ABNORMAL LOW (ref 98–111)
Creatinine, Ser: 12.03 mg/dL — ABNORMAL HIGH (ref 0.61–1.24)
GFR, Estimated: 4 mL/min — ABNORMAL LOW (ref >60)
Glucose, Bld: 78 mg/dL (ref 70–99)
Potassium: 5.2 mmol/L — ABNORMAL HIGH (ref 3.5–5.1)
Sodium: 133 mmol/L — ABNORMAL LOW (ref 135–145)

## 2022-03-10 LAB — GLUCOSE, CAPILLARY
Glucose-Capillary: 84 mg/dL (ref 70–99)
Glucose-Capillary: 84 mg/dL (ref 70–99)
Glucose-Capillary: 91 mg/dL (ref 70–99)
Glucose-Capillary: 75 mg/dL (ref 70–99)

## 2022-03-10 LAB — CBC
HCT: 31.5 % — ABNORMAL LOW (ref 39.0–52.0)
Hemoglobin: 10 g/dL — ABNORMAL LOW (ref 13.0–17.0)
MCH: 28.1 pg (ref 26.0–34.0)
MCHC: 31.7 g/dL (ref 30.0–36.0)
MCV: 88.5 fL (ref 80.0–100.0)
Platelets: 353 10*3/uL (ref 150–400)
RBC: 3.56 MIL/uL — ABNORMAL LOW (ref 4.22–5.81)
RDW: 17.1 % — ABNORMAL HIGH (ref 11.5–15.5)
WBC: 7.9 10*3/uL (ref 4.0–10.5)
nRBC: 0 % (ref 0.0–0.2)

## 2022-03-10 LAB — CULTURE, BLOOD (ROUTINE X 2): Culture: NO GROWTH

## 2022-03-10 MED ORDER — OXYCODONE-ACETAMINOPHEN 5-325 MG PO TABS
1.0000 | ORAL_TABLET | ORAL | Status: DC | PRN
  Administered 2022-03-13 – 2022-03-15 (×6): 1 via ORAL
  Filled 2022-03-10 (×6): qty 1

## 2022-03-10 MED ORDER — SODIUM CHLORIDE 0.9 % IV BOLUS: 250.0000 mL | Freq: Once | INTRAVENOUS | Status: DC

## 2022-03-10 MED ORDER — LORAZEPAM 2 MG/ML IJ SOLN
2.0000 mg | INTRAMUSCULAR | Status: AC
  Administered 2022-03-10: 2 mg via INTRAVENOUS
  Filled 2022-03-10: qty 1

## 2022-03-10 MED ORDER — MORPHINE SULFATE ER 15 MG PO TBCR: 15.0000 mg | EXTENDED_RELEASE_TABLET | Freq: Two times a day (BID) | ORAL | Status: DC

## 2022-03-10 NOTE — Discharge Planning (Addendum)
ESTABLISHED HEMODIALYSIS PATIENT:    Outpatient facility:  [facility]  [place], [place] [address] [phone]   Treatment days: Monday, Wednesday and Friday   Chair time: 07:25am   Confirmed schedule with Neoma Laming RN, patient rides with [employer].

## 2022-03-10 NOTE — Progress Notes (Signed)
[facility], [place] 03/10/22  Subjective:   LOS: 5  Patient known to our practice from previous admissions.  Patient admitted for worsening right lower extremity ulcer.  Currently getting IV broad-spectrum antibiotics.   Patient underwent irrigation and debridement of right lower extremity and placement of wound VAC.  Patient seen laying in bed Alert, disoriented, restless Unable to follow commands   Objective:  Vital signs in last 24 hours:  Temp:  [98.2 F (36.8 C)-99.3 F (37.4 C)] 98.4 F (36.9 C) (12/26 1241) Pulse Rate:  [66-89] 79 (12/26 1241) Resp:  [18-20] 18 (12/26 1241) BP: (85-109)/(56-72) 92/64 (12/26 1241) SpO2:  [90 %-100 %] 100 % (12/26 1241) FiO2 (%):  [21 %] 21 % (12/26 0025)  Weight change:  Filed Weights   03/05/22 1312 03/08/22 1104 03/08/22 1214  Weight: 115.7 kg 113 kg 114 kg    Intake/Output:    Intake/Output Summary (Last 24 hours) at 03/10/2022 1532 Last data filed at 03/10/2022 1212 Gross per 24 hour  Intake 213.31 ml  Output --  Net 213.31 ml      Physical Exam: General: Chronically ill-appearing, laying in the bed  HEENT Moist oral mucous membranes  Pulm/lungs Normal breathing effort  CVS/Heart No rub  Abdomen:  Soft, nontender, nondistended  Extremities: Chronic right lower extremity wound with wound VAC, trace edema  Neurologic: Alert, restless  Skin: No acute rashes  Access: AV fistula       Basic Metabolic Panel:  Recent Labs  Lab 03/05/22 1323 03/07/22 0736 03/08/22 0915  NA 133* 134* 135  K 3.2* 4.4 4.1  CL 89* 94* 95*  CO2 30 30 26   GLUCOSE 102* 108* 103*  BUN 26* 24* 35*  CREATININE 7.14* 6.60* 8.74*  CALCIUM 8.8* 8.2* 8.4*  PHOS  --   --  6.1*      CBC: Recent Labs  Lab 03/05/22 1323 03/07/22 0736 03/08/22 0947  WBC 9.1 11.5* 11.8*  NEUTROABS 7.0  --   --   HGB 12.9* 11.4* 10.1*  HCT 40.5 35.3* 33.0*  MCV 87.5 87.4 88.9  PLT 350 344 331       Lab Results   Component Value Date   HEPBSAG NON REACTIVE 03/06/2022      Microbiology:  Recent Results (from the past 240 hour(s))  Culture, blood (routine x 2)     Status: None   Collection Time: 03/05/22  7:43 PM   Specimen: BLOOD  Result Value Ref Range Status   Specimen Description BLOOD BLOOD RIGHT ARM  Final   Special Requests   Final    BOTTLES DRAWN AEROBIC AND ANAEROBIC Blood Culture results may not be optimal due to an inadequate volume of blood received in culture bottles   Culture   Final    NO GROWTH 5 DAYS Performed at [facility], [address] [place], [place] [address]    Report Status 03/10/2022 FINAL  Final  Surgical PCR screen     Status: None   Collection Time: 03/06/22 11:31 PM   Specimen: Nasal Mucosa; Nasal Swab  Result Value Ref Range Status   MRSA, PCR NEGATIVE NEGATIVE Final   Staphylococcus aureus NEGATIVE NEGATIVE Final    Comment: (NOTE) The Xpert SA Assay (FDA approved for NASAL specimens in patients 16 years of age and older), is one component of a comprehensive surveillance program. It is not intended to diagnose infection nor to guide or monitor treatment. Performed at [facility], [address] [place], [place] [address]   Aerobic/Anaerobic Culture w Gram Stain (surgical/deep wound)     Status: None (Preliminary result)   Collection Time: 03/07/22  9:50 AM   Specimen: Wound  Result Value Ref Range Status   Specimen Description   Final    WOUND Performed at [facility], [address] [place], [place] [address]    Special Requests   Final    right lower extremity wound Performed at [facility], [place], [place] [address]    Gram Stain   Final    FEW GRAM NEGATIVE RODS RARE WBC PRESENT, PREDOMINANTLY PMN    Culture   Final    FEW CITROBACTER KOSERI FEW ENTEROCOCCUS FAECIUM FEW PROTEUS MIRABILIS CULTURE REINCUBATED FOR BETTER GROWTH NO ANAEROBES ISOLATED Performed at [facility], [place] [address], [place], [place] [address]    Report Status PENDING  Incomplete    Coagulation Studies: No results for input(s): "LABPROT", "INR" in the last 72 hours.   Urinalysis: No results for input(s): "COLORURINE", "LABSPEC", "PHURINE", "GLUCOSEU", "HGBUR", "BILIRUBINUR", "KETONESUR", "PROTEINUR", "UROBILINOGEN", "NITRITE", "LEUKOCYTESUR" in the last 72 hours.  Invalid input(s): "APPERANCEUR"    Imaging: MR BRAIN WO CONTRAST  Result Date: 03/10/2022 CLINICAL DATA:  Neuro deficit with acute stroke suspected EXAM: MRI HEAD WITHOUT CONTRAST TECHNIQUE: Multiplanar, multiecho pulse sequences of the brain and surrounding structures were obtained without intravenous contrast. COMPARISON:  Head CT from earlier today FINDINGS: Brain: Punctate recent infarct in the right corona radiata and anterior frontal lobe, see markings on diffusion images. Small remote right cerebellar infarct. Small remote cortical infarcts scattered along the left frontal and parietal cortex. Rounded area of intense hemosiderin deposition in the right cerebellum measuring 16 mm, primarily low-density on head CT and likely old hemorrhagic lacunar infarct rather than cavernoma. Chronic lacunar infarct at the left external capsule. Generalized cerebral volume loss. No hemorrhage, hydrocephalus, or collection. Vascular: Major flow voids are preserved Skull and upper cervical spine: No focal marrow lesion. Low marrow signal in the covered cervical spine, homogeneous and usually physiologic. Sinuses/Orbits: No acute finding IMPRESSION: 1. Two small acute or subacute infarcts in the right cerebral white matter. 2. Prior lacunar and left frontoparietal cortex infarcts. 3. Remote hemorrhagic insult in the right cerebellum Electronically Signed   By: Jorje Guild M.D.   On: 03/10/2022 15:16   EEG adult  Result Date: 03/10/2022 Lora Havens, MD     03/10/2022 12:25 PM Patient Name: Adam Howell MRN:  [id number] Epilepsy Attending: Lora Havens Referring Physician/Provider: Kerney Elbe, MD Date: 03/10/2022 Duration: 25.57 mins Patient history: 61yo M with second episode of awake unresponsiveness. Subtle BUE intermittent jerking versus asterixis seen on exam.  EEG to evaluate for seizure. Level of alertness: lethargic AEDs during EEG study: None Technical aspects: This EEG study was done with scalp electrodes positioned according to the 10-20 International system of electrode placement. Electrical activity was reviewed with band pass filter of 1-70Hz , sensitivity of 7 uV/mm, display speed of 27mm/sec with a 60Hz  notched filter applied as appropriate. EEG data were recorded continuously and digitally stored.  Video monitoring was available and reviewed as appropriate. Description: EEG showed continuous generalized 3 to 5 Hz theta-delta slowing. Hyperventilation and photic stimulation were not performed.   ABNORMALITY - Continuous slow, generalized IMPRESSION: This study is suggestive of severe diffuse encephalopathy, nonspecific etiology. No seizures or epileptiform discharges were seen throughout the recording. [place]   CT HEAD CODE STROKE WO  CONTRAST`  Result Date: 03/10/2022 CLINICAL DATA:  Code stroke. Neuro deficit, acute, stroke suspected. EXAM: CT HEAD WITHOUT CONTRAST TECHNIQUE: Contiguous axial images were obtained from the base of the skull through the vertex without intravenous contrast. RADIATION DOSE REDUCTION: This exam was performed according to the departmental dose-optimization program which includes automated exposure control, adjustment of the mA and/or kV according to patient size and/or use of iterative reconstruction technique. COMPARISON:  No pertinent prior exams available for comparison. FINDINGS: Brain: Mild generalized cerebral atrophy. Small age-indeterminate cortically-based infarcts within the posterior left frontal lobe and left parietal lobe (series 3, images 26  and 25). Small chronic cortically-based infarct within the left occipital lobe. Age-indeterminate lacunar infarct within the left corona radiata (series 3, image 19). 10 mm age-indeterminate infarct within the right cerebellar hemisphere. There is no acute intracranial hemorrhage. No extra-axial fluid collection. No evidence of an intracranial mass. No midline shift. Vascular: No hyperdense vessel. Atherosclerotic calcifications. Skull: No fracture or aggressive osseous lesion. Sinuses/Orbits: No mass or acute finding within the imaged orbits. Trace mucosal thickening scattered within the bilateral ethmoid air cells. ASPECTS ([place] Stroke Program Early CT Score) - Ganglionic level infarction (caudate, lentiform nuclei, internal capsule, insula, M1-M3 cortex): 7 - Supraganglionic infarction (M4-M6 cortex): 1 Total score (0-10 with 10 being normal): 9 (when discounting an age-indeterminate cortically based infarct within the posterior left frontal lobe). These results were communicated to Dr. Cheral Marker at 8:49 amon 12/26/2023by text page via the [facility] messaging system. IMPRESSION: 1. Age-indeterminate infarcts within the cortical/subcortical left frontal and left parietal lobes, left corona radiata and right cerebellar hemisphere as described. ASPECTS is 8. 2. Small chronic cortically-based infarct within the left occipital lobe. 3. Mild cerebral atrophy. Electronically Signed   By: Kellie Simmering D.O.   On: 03/10/2022 08:50     Medications:    anticoagulant sodium citrate     piperacillin-tazobactam (ZOSYN)  IV 2.25 g (03/10/22 1508)    (feeding supplement) PROSource Plus  30 mL Oral BID BM   amiodarone  200 mg Oral BID   apixaban  2.5 mg Oral BID   bumetanide  1 mg Oral BID   Chlorhexidine Gluconate Cloth  6 each Topical Q0600   insulin aspart  0-5 Units Subcutaneous QHS   insulin aspart  0-6 Units Subcutaneous TID WC   morphine  15 mg Oral Q12H   multivitamin  1 tablet Oral QHS   nutrition supplement  (JUVEN)  1 packet Oral BID BM   rosuvastatin  40 mg Oral Daily   sertraline  50 mg Oral Daily   acetaminophen **OR** acetaminophen, alteplase, anticoagulant sodium citrate, heparin, lidocaine (PF), lidocaine-prilocaine, ondansetron **OR** ondansetron (ZOFRAN) IV, oxyCODONE-acetaminophen, pentafluoroprop-tetrafluoroeth, tiZANidine  Assessment/ Plan:  61 y.o. male with diabetes with complications, ESRD, atrial fibrillation requiring Eliquis, amiodarone, congestive heart failure, secondary hyperparathyroidism, chronic nonhealing right lower extremity wound was admitted on 03/05/2022 for  Principal Problem:   Wound infection of chronic right lower extremity ulcer Active Problems:   Anemia in chronic kidney disease   ESRD on dialysis ([place])   Obesity (BMI 30-39.9)   Type 2 diabetes mellitus with ESRD (end-stage renal disease) (HCC)   Chronic atrial fibrillation with RVR (HCC)   Hyperlipidemia associated with type 2 diabetes mellitus (HCC)   Chronic systolic CHF (congestive heart failure) (HCC)   Hypokalemia   Hyponatremia   Acute metabolic encephalopathy   Hypotension  Unspecified open wound, right lower leg, initial encounter [S81.801A] Necrotizing fasciitis of lower leg ([place]) [M72.6]  #.  ESRD [facility]/MWF/left aVF   Will offer dialysis today, no UF, to see if mentation improves. Next treatment scheduled for Wednesday to maintain outpatient schedule.   #. Anemia of CKD  Lab Results  Component Value Date   HGB 10.1 (L) 03/08/2022  Hemoglobin acceptable.  Low dose EPO with HD for hemoglobin less than 10  #. Secondary hyperparathyroidism of renal origin N 25.81   No results found for: "PTH" Lab Results  Component Value Date   PHOS 6.1 (H) 03/08/2022   Calcium within desired range, phosphorus elevated. Appetite currently poor. Will hold binders for now.    #.  Nonhealing right lower extremity ulcer with wound infection -Broad-spectrum antibiotics -Irrigation  and debridement, placement of wound VAC on 03/07/2022.   LOS: [place] 12/26/20233:32 PM  [place], [place]

## 2022-03-10 NOTE — Progress Notes (Signed)
Pt present in HD unit. Upon assessment, HD cancelled for pt today per Sherlyn Hay, NP.

## 2022-03-10 NOTE — Plan of Care (Signed)
  Problem: Nutrition: Goal: Adequate nutrition will be maintained Outcome: Progressing   Problem: Pain Managment: Goal: General experience of comfort will improve Outcome: Progressing   Problem: Safety: Goal: Ability to remain free from injury will improve Outcome: Progressing   

## 2022-03-10 NOTE — Significant Event (Signed)
Rapid Response Event Note   Reason for Call :  Inpatient code stroke  Initial Focused Assessment:  Rapid response RN arrived in CT with patient already on the table finishing his noncontrast head CT. Patient dayshift RN, Caesar Bookman, and another 2C RN, Chelsea, accompanied patient to CT. Dr. Cheral Marker arrived shortly after this nurse to CT. He performed a neuro exam on the CT table and based on those findings canceled the CT head with contrast. See neurology note for full neuro assessment, but exam noted to have patient only oriented to self, did not answer any other questions. Unable to follow all commands, did stick out his tongue, but extremity movement not consistent to command, particularly for lower extremities (right foot is in pressure reducing boot with wound vac). Sensation appeared intact. Patient with intermittent twitching of upper extremities. And at times during assessment would stare off into space and neither follow commands nor track staff.  Last known well, alert and oriented x4 and clear speech, was 15:00 on 03/09/2022 per documentation in nursing flowsheets.  Per [facility] staff patient has not had full dialysis treatment in several days. He has been having periods of decreased responsiveness and lack of ability to follow commands at night off and on for the past several nights.   Interventions:  Administered 2mg  of IV ativan stat per orders from Dr. Cheral Marker after arriving back on 2C. Vital signs upon arrival to CT were BP 105/68 MAP 80. HR 77-83. Respirations 14 and unlabored. Oxygen saturations 100% on 3L nasal cannula. Post ativan patient was drowsy but could focus on staff, oriented to self, just shrugged when asked location questions, and stated he was fine with other questions. Obtained 12 lead EKG due to uncertainty about rhythm on portable rapid response monitor. 12 lead showed afib, which patient does have a history of and has been taking Eliquis for.   Plan of Care:  Patient to remain  on [place] for now, doctors reviewing chart and will make changes/additions to orders as needed. Aleah RN instructed to recall rapid response nurse if needed.  Event Summary:   MD Notified: Dr. Roosevelt Locks paged out code stroke, Dr. Cheral Marker neurologist Call Time: 08:19 Arrival Time: 08:21 to room 215 where informed patient already in CT, 08:23 when arrived in CT End Time: 08:56  Julian Medina, Jaynie Bream, RN

## 2022-03-10 NOTE — Progress Notes (Signed)
Eeg done 

## 2022-03-10 NOTE — Progress Notes (Signed)
Progress Note   Patient: Adam Howell ZMO:[id number] DOB: November 02, 1960 DOA: 03/05/2022     5 DOS: the patient was seen and examined on 03/10/2022   Brief hospital course:  Donold Marotto is a 61 y.o. male with medical history significant for diabetes mellitus with complications of end-stage renal disease on hemodialysis (M/W/F), history of A-fib on Eliquis and amiodarone, HFpEF (EF 35 to 40% 2021), secondary hyperparathyroidism with chronic nonhealing right lower extremity wound for which he was hospitalized for debridement and IV antibiotics from 10/31 to 11/6.  He was sent to the ED by his home health nurse due to concerns for increased drainage and foul odor from the wound in spite of ongoing dressings.  Patient also reports increased pain of the lower leg.   MRI right tib-fib suggesting myositis without osteomyelitis. Patient was started on Zosyn and vancomycin.  Patient was taken to the OR for debridement on 12/23.  Culture sent out.  Patient had a prolonged episode of altered mental status postop, currently improved.  Had another episode of decreased responsiveness on 12/26.  Seen by neurology, most likely metabolic encephalopathy.      Assessment and Plan:  Acute metabolic encephalopathy. Hypotension. Patient had a prolonged altered mental status post op due to end-stage renal disease.  Blood pressure was low overnight, received fluid bolus.  Mental status finally improved. Patient had another episode of mental status change this morning, he was confused, minimally responsive.  Code stroke was called, seen by Dr. Cheral Marker, does not appear to have stroke, I reviewed the CT images together with exam, he appears to have some old stroke, no new stroke at this time.  A brain MRI is ordered to confirm. Patient condition more consistent with encephalopathy, ordered stat EEG to rule out seizure.  Also received Ativan. I also reduced patient pain medicine dose. Patient currently on  broad-spectrum antibiotics for wound infection, does not appear to have any new infection.   Wound infection of chronic right lower extremity ulcer Blood culture negative. Patient had a debridement performed 12/23.  Gram stain shows gram negative rods, culture only has gram-negative rods.  Discontinue vancomycin, continue Zosyn for now. Wound culture still pending, no change in treatment plan  Chronic systolic CHF (congestive heart failure) (HCC) Condition stable without volume overload.   Type 2 diabetes mellitus with ESRD (end-stage renal disease) ([place]) Sliding scale insulin coverage   ESRD on dialysis [facility]) Patient does not have volume overload, followed by nephrology for scheduled dialysis.   Chronic Atrial fibrillation (HCC) Continue Eliquis and amiodarone   Anemia in chronic kidney disease Stable.   Hyperlipidemia associated with type 2 diabetes mellitus ([place]) Continue rosuvastatin   Obesity with BMI 31.87        Subjective:  Patient had an episode of unresponsiveness today, seen by neurology. Not short of breath or cough.  Physical Exam: Vitals:   03/10/22 0553 03/10/22 0734 03/10/22 0901 03/10/22 0957  BP: 109/70 (!) 93/58 105/68 109/72  Pulse: 66 83 87 78  Resp: 18 18 20 20   Temp: 98.2 F (36.8 C) 98.2 F (36.8 C)    TempSrc: Oral     SpO2: 93% 93% 100% 100%  Weight:      Height:       General exam: Appears calm and comfortable  Respiratory system: Clear to auscultation. Respiratory effort normal. Cardiovascular system: S1 & S2 heard, RRR. No JVD, murmurs, rubs, gallops or clicks. No pedal edema. Gastrointestinal system: Abdomen is nondistended, soft and nontender.  No organomegaly or masses felt. Normal bowel sounds heard. Central nervous system: Alert and oriented x2. No focal neurological deficits. Extremities: Symmetric 5 x 5 power. Skin: No rashes, lesions or ulcers Psychiatry: Flat affect   Data Reviewed:  CT scan results reviewed, lab  results reviewed.  Family Communication:   Disposition: Status is: Inpatient Remains inpatient appropriate because: Severity of disease, IV treatment, altered mental status.  Planned Discharge Destination: Home with Home Health    Time spent: 50 minutes  Author: Sharen Hones, MD 03/10/2022 11:48 AM  For on call review [email or web address].

## 2022-03-10 NOTE — Progress Notes (Signed)
PT Cancellation Note  Patient Details Name: Adam Howell MRN: [id number] DOB: Feb 22, 1961   Cancelled Treatment:    Reason Eval/Treat Not Completed: Patient not medically ready.  Chart reviewed and attempted to see pt.  Pt did have code stroke called this AM, but attempt was made.  Pt nonverbal for much of the session and only able to state his name and partial DOB.  Nursing advised and suggested to hold till tomorrow.  Will re-attempt at later date and time as medically appropriate.   Gwenlyn Saran, PT, DPT Physical Therapist- [facility]  03/10/22, 11:31 AM

## 2022-03-10 NOTE — Consult Note (Addendum)
[facility] Nurse Consult Note: Reason for Consult: Consult requested to change Vac dressing to right leg. Wound type: Full thickness post-op wound to right anterior calf; white exposed tendon separated from the rest of the wound, which is beefy red and has small amt bloody drainage to the wound edges. 18X10X1cm.  Removed 3 pieces white foam from above and below tendon, and one piece black foam.  Replaced with the same amount; 3 white, 1 black to cont suction at 162mm. When the suction was applied, the black foam moved underneath the drape and part of the inner calf wound is not covered by the black sponge, but is protected by the drape and has a good seal. Pt was very obtunded since a Code Stroke was called earlier and he was not medicated for pain. He tolerated without apparent discomfort. Applied ace wrap over the right leg. Small amt pink drainage in the cannister. [facility] team will plan to change dressing again on Thurs. Thank-you,  Julien Girt MSN, [place], [place], [place], [place]

## 2022-03-10 NOTE — Consult Note (Signed)
Code stroke cart alerted at [phone]. Pt getting NCCT at this time. Neuro already spoke with MD per RRT RN report. Dr Cheral Marker in [place] for assessment at [place]. No CTA needed per Dr Cheral Marker and dismissed off camera at [phone].

## 2022-03-10 NOTE — Procedures (Signed)
Patient Name: Adam Howell  MRN: [id number]  Epilepsy Attending: Lora Havens  Referring Physician/Provider: Kerney Elbe, MD  Date: 03/10/2022 Duration: 25.57 mins  Patient history: 61yo M with second episode of awake unresponsiveness. Subtle BUE intermittent jerking versus asterixis seen on exam.  EEG to evaluate for seizure.  Level of alertness: lethargic   AEDs during EEG study: None  Technical aspects: This EEG study was done with scalp electrodes positioned according to the 10-20 International system of electrode placement. Electrical activity was reviewed with band pass filter of 1-70Hz , sensitivity of 7 uV/mm, display speed of 86mm/sec with a 60Hz  notched filter applied as appropriate. EEG data were recorded continuously and digitally stored.  Video monitoring was available and reviewed as appropriate.  Description: EEG showed continuous generalized 3 to 5 Hz theta-delta slowing. Hyperventilation and photic stimulation were not performed.     ABNORMALITY - Continuous slow, generalized  IMPRESSION: This study is suggestive of severe diffuse encephalopathy, nonspecific etiology. No seizures or epileptiform discharges were seen throughout the recording.  [name] [name]

## 2022-03-10 NOTE — Consult Note (Addendum)
NEURO HOSPITALIST CONSULT NOTE   Requestig physician: Dr. Roosevelt Locks  Reason for Consult: Awake unresponsive state    History obtained from:  Hospitalist, RN and Chart     HPI:                                                                                                                                          Adam Howell is an 61 y.o. male with ESRD on HD, CHF, DM, HLD, anemia of chronic disease, atrial fibrillation, chronic RLE ulcer and HTN, who was admitted 12/22 for RLE osteomyelitis, now s/p wound debridement (currently on wound-vac and ABX). Post-operatively on 12/23 he experienced AMS described as an awake but poorly responsive state, which then spontaneously resolved. This morning, after waking up, he was again noted to be poorly responsive. There was question of flaccidity of one of his arms. Code Stroke was called.   LKN was 1500 yesterday, when he was alert and oriented x 4 with clear speech. By 2200 he had declined, only being oriented x 2 at that time. No focal weakness noted in nursing assessments.    Past Medical History:  Diagnosis Date   Chronic kidney disease    Congestive heart failure ([place])    Diabetes mellitus without complication ([place])    Hyperlipidemia    Hypertension     Past Surgical History:  Procedure Laterality Date   APPLICATION OF WOUND VAC Right 03/07/2022   Procedure: APPLICATION OF WOUND VAC;  Surgeon: Evaristo Bury, MD;  Location: [facility] ORS;  Service: Vascular;  Laterality: Right;  [place] [id number]   WOUND DEBRIDEMENT Left    WOUND DEBRIDEMENT Right 01/16/2022   Procedure: DEBRIDEMENT WOUND;  Surgeon: Katha Cabal, MD;  Location: [facility] ORS;  Service: Vascular;  Laterality: Right;  Wound vac placement   WOUND DEBRIDEMENT Right 03/07/2022   Procedure: DEBRIDEMENT WOUND RIGHT LOWER EXTREMITY;  Surgeon: Evaristo Bury, MD;  Location: [facility] ORS;  Service: Vascular;  Laterality: Right;    Family History  Problem Relation Age of  Onset   Heart failure Mother    Cirrhosis Father    Hypertension Daughter               Social History:  reports that he has never smoked. He has never used smokeless tobacco. He reports that he does not currently use alcohol. He reports that he does not use drugs.  Allergies  Allergen Reactions   Tramadol Rash    MEDICATIONS:  Medications Prior to Admission  Medication Sig Dispense Refill Last Dose   HYDROcodone-acetaminophen (NORCO) 10-325 MG tablet Take 1 tablet by mouth every 8 (eight) hours as needed. Do not take more than is ordered and bring pill bottle to next provider visit. 90 tablet 0 prn at prn   insulin aspart (NOVOLOG) 100 UNIT/ML FlexPen Inject 3 Units into the skin 3 (three) times daily with meals. As needed only if blood sugar greater then 130 prior to meal. 15 mL 2 prn at prn   losartan (COZAAR) 25 MG tablet Take 1 tablet (25 mg total) by mouth daily. 30 tablet 0 Past Week at unknown   rosuvastatin (CRESTOR) 40 MG tablet Take 1 tablet (40 mg total) by mouth daily. 90 tablet 4 Past Week at unknown   sevelamer carbonate (RENVELA) 800 MG tablet Take 2 tablets (1,600 mg total) by mouth 3 (three) times daily. 360 tablet 4 Past Week at unknown   tiZANidine (ZANAFLEX) 2 MG tablet TAKE 1 TABLET BY MOUTH ONCE DAILY AS NEEDED FOR MUSCLE SPASM 30 tablet 4 prn at prn   amiodarone (PACERONE) 200 MG tablet Take 1 tablet (200 mg total) by mouth 2 (two) times daily. (Patient not taking: Reported on 03/06/2022) 180 tablet 4 Not Taking   bumetanide (BUMEX) 1 MG tablet Take 1 tablet (1 mg total) by mouth 2 (two) times daily. (Patient not taking: Reported on 03/06/2022) 90 tablet 4 Not Taking   carvedilol (COREG) 3.125 MG tablet Take 1 tablet (3.125 mg total) by mouth 2 (two) times daily. (Patient not taking: Reported on 03/06/2022) 180 tablet 4 Not Taking   ELIQUIS 2.5 MG TABS  tablet Take 1 tablet (2.5 mg total) by mouth 2 (two) times daily. Sig: Daily as presciribed. (Patient not taking: Reported on 03/06/2022) 180 tablet 4 Not Taking   gabapentin (NEURONTIN) 300 MG capsule Take 1 capsule (300 mg total) by mouth 2 (two) times daily. (Patient not taking: Reported on 03/06/2022) 180 capsule 4 Not Taking   isosorbide mononitrate (IMDUR) 30 MG 24 hr tablet Take 1 tablet (30 mg total) by mouth daily. (Patient not taking: Reported on 03/06/2022) 90 tablet 4 Not Taking   NIFEdipine (PROCARDIA-XL/NIFEDICAL-XL) 30 MG 24 hr tablet Take 1 tablet (30 mg total) by mouth daily. (Patient not taking: Reported on 03/06/2022) 90 tablet 4 Not Taking   sertraline (ZOLOFT) 50 MG tablet Take 50 mg by mouth daily. (Patient not taking: Reported on 03/06/2022)   Not Taking   Scheduled:  (feeding supplement) PROSource Plus  30 mL Oral BID BM   amiodarone  200 mg Oral BID   apixaban  2.5 mg Oral BID   bumetanide  1 mg Oral BID   Chlorhexidine Gluconate Cloth  6 each Topical Q0600   gabapentin  300 mg Oral BID   insulin aspart  0-5 Units Subcutaneous QHS   insulin aspart  0-6 Units Subcutaneous TID WC   morphine  30 mg Oral Q12H   multivitamin  1 tablet Oral QHS   nutrition supplement (JUVEN)  1 packet Oral BID BM   rosuvastatin  40 mg Oral Daily   sertraline  50 mg Oral Daily   sevelamer carbonate  1,600 mg Oral TID with meals   Continuous:  anticoagulant sodium citrate     piperacillin-tazobactam (ZOSYN)  IV 2.25 g (03/10/22 0402)   sodium chloride       ROS:  Unable to obtain due to AMS.    Blood pressure (!) 93/58, pulse 83, temperature 98.2 F (36.8 C), resp. rate 18, height 6\' 3"  (1.905 m), weight 114 kg, SpO2 93 %.   General Examination:                                                                                                        Physical Exam  HEENT-  [place]/AT    Lungs- No tachypnea or increased WOB Extremities- Wound vac and dressing to distal RLE  Neurological Examination Mental Status: Initially with eyes half-open and lethargic. With noxious stimuli and continuous vocal stimuli, the patient's eyes open fully and he is able to follow approximately 25% of all simple commands with increased latencies of motor responses. He was able to state his name with dysarthric speech when asked, but cannot answer any other orientation questions. Face is fixed in a slight grimacing expression. Does not make eye contact but did visually track a moving object with difficulty when asked.  Cranial Nerves: II: Blinks to threat in temporal visual fields of both eyes, slightly less briskly on the left. Does not participate in formal confrontation testing. PERRL.   III,IV, VI: Will track to left and right without nystagmus. Slightly more hesitancy when tracking to the left.  V: Blinks to eyelid stimulation bilaterally, slightly less briskly on the left.  VII: Grimace is symmetric VIII: Hearing intact to voice IX,X: Gag reflex deferred XI: Head is midline XII: Protrudes tongue to command about 5 mm without deviation.  Motor: Does not follow motor commands. When BUE are passively elevated and released, they remain in position for > 10 seconds and then gradually drift downwards, without asymmetry. There is increased tone in BUE without asymmetry.  RLE: Wiggles right toe to tickle stimulus. Does not elevate RLE to command or with pinch. LLE: Weak flexion at knee/hip and ADF with noxious plantar stimulation. Does not elevate to command. Does not remain elevated after passively lifted and released by examiner.  Sensory: Grimaces to noxious x 4 with increased latencies of responses.  Deep Tendon Reflexes: 2+ bilateral brachioradialis. 1+ bilateral patellae. Toes equivocal.  Cerebellar: Does not follow commands for testing.  Gait: Unable to  assess   Lab Results: Basic Metabolic Panel: Recent Labs  Lab 03/05/22 1323 03/07/22 0736 03/08/22 0915  NA 133* 134* 135  K 3.2* 4.4 4.1  CL 89* 94* 95*  CO2 30 30 26   GLUCOSE 102* 108* 103*  BUN 26* 24* 35*  CREATININE 7.14* 6.60* 8.74*  CALCIUM 8.8* 8.2* 8.4*  PHOS  --   --  6.1*    CBC: Recent Labs  Lab 03/05/22 1323 03/07/22 0736 03/08/22 0947  WBC 9.1 11.5* 11.8*  NEUTROABS 7.0  --   --   HGB 12.9* 11.4* 10.1*  HCT 40.5 35.3* 33.0*  MCV 87.5 87.4 88.9  PLT 350 344 331    Cardiac Enzymes: No results for input(s): "CKTOTAL", "CKMB", "CKMBINDEX", "TROPONINI" in the last 168 hours.  Lipid Panel: No results for input(s): "CHOL", "TRIG", "HDL", "CHOLHDL", "VLDL", "  [place]" in the last 168 hours.  Imaging: No results found.  Assessment: 61 y.o. male with ESRD on HD, CHF, DM, HLD, anemia of chronic disease, atrial fibrillation, chronic RLE ulcer and HTN, who was admitted 12/22 for RLE osteomyelitis, now s/p wound debridement (currently on wound-vac and ABX). Post-operatively on 12/23 he experienced AMS described as an awake but poorly responsive state, which then spontaneously resolved. This morning, after waking up, he was again noted to be poorly responsive. There was question of flaccidity of one of his arms. LKN was 1500 yesterday, when he was alert and oriented x 4 with clear speech. By 2200 he had declined, only being oriented x 2 at that time. No focal weakness noted in nursing assessments.     - Neurological exam is most consistent with an acute encephalopathy. DDx for etiology includes medication-related, metabolic and infectious. Twitching movement of upper extremities are intermittently seen, appearing most consistent with asterixis or seizure activity. Asterixis would militate more in favor of a metabolic encephalopathy. The waxy rigidity of his limbs on exam may also be due to a catatonic state, which would localize as diffuse severe hypofunction of the anterior  frontal lobes.  - DDx:  - Factors predisposing to an encephalopathy include his known infection as well as ESRD. Morphine is not felt to be likely to be a significant contributor.  - Neurontin could result in erratic asterixis-like limb movements in the setting of poor clearance due to ESRD; however, Neurontin at the current dose would not be expected to produce the pronounced encephalopathy seen on exam.  - Hypotensive at the time of assessment. Severe hypotension can present with AMS.    Recommendations: - Toxic/metabolic work up [employer] Neurontin - STAT EEG has been ordered - MRI brain WITHOUT contrast - Normalize BP to at least 120/80 - Continue tizanidine, as abrupt withdrawal from this muscle relaxer can cause delirium, anxiety, tremor and hypertension  Addendum: - MRI completed. Official read classifies two faint punctate DWI hyperintensities as acute/subacute infarcts. On personal review of the images by Neurology, the punctate signal changes do not fit official criteria for cytotoxic edema due to stroke and are felt most likely to be incidental. Also seen are the following chronic findings: Prior lacunar and left frontoparietal cortex infarcts; remote hemorrhagic insult in the right cerebellum. - EEG:  Continuous generalized slowing. This study is suggestive of severe diffuse encephalopathy, nonspecific to etiology. No seizures or epileptiform discharges were seen throughout the recording.   Electronically signed: Dr. Kerney Elbe 03/10/2022, 8:17 AM

## 2022-03-10 NOTE — Progress Notes (Signed)
Around 8am RN walked into pt room and pt was hard to arouse even with a sternal rub. Pt was just mumbling words. Twitching motions with his arms and systolic BP in 34K. A&O x0. Dr. Roosevelt Locks was in hallway and was called in to bedside.    9:00am pt still lethargic and only responds to pain. A&Ox1 and still not at baseline. Pt not able to follow any directions. Holding all medications which includes eliquis. Dr. Roosevelt Locks notified and aware.

## 2022-03-11 DIAGNOSIS — T148XXA Other injury of unspecified body region, initial encounter: Secondary | ICD-10-CM | POA: Diagnosis not present

## 2022-03-11 DIAGNOSIS — G9341 Metabolic encephalopathy: Secondary | ICD-10-CM | POA: Diagnosis not present

## 2022-03-11 DIAGNOSIS — R401 Stupor: Secondary | ICD-10-CM

## 2022-03-11 DIAGNOSIS — L089 Local infection of the skin and subcutaneous tissue, unspecified: Secondary | ICD-10-CM | POA: Diagnosis not present

## 2022-03-11 DIAGNOSIS — I739 Peripheral vascular disease, unspecified: Secondary | ICD-10-CM | POA: Diagnosis not present

## 2022-03-11 DIAGNOSIS — I482 Chronic atrial fibrillation, unspecified: Secondary | ICD-10-CM

## 2022-03-11 DIAGNOSIS — E669 Obesity, unspecified: Secondary | ICD-10-CM

## 2022-03-11 DIAGNOSIS — N186 End stage renal disease: Secondary | ICD-10-CM | POA: Diagnosis not present

## 2022-03-11 DIAGNOSIS — R4182 Altered mental status, unspecified: Secondary | ICD-10-CM | POA: Diagnosis not present

## 2022-03-11 DIAGNOSIS — Z794 Long term (current) use of insulin: Secondary | ICD-10-CM

## 2022-03-11 DIAGNOSIS — D631 Anemia in chronic kidney disease: Secondary | ICD-10-CM

## 2022-03-11 LAB — GLUCOSE, CAPILLARY
Glucose-Capillary: 107 mg/dL — ABNORMAL HIGH (ref 70–99)
Glucose-Capillary: 61 mg/dL — ABNORMAL LOW (ref 70–99)
Glucose-Capillary: 64 mg/dL — ABNORMAL LOW (ref 70–99)
Glucose-Capillary: 89 mg/dL (ref 70–99)

## 2022-03-11 LAB — BLOOD GAS, ARTERIAL
Acid-Base Excess: 1.9 mmol/L (ref 0.0–2.0)
Bicarbonate: 31 mmol/L — ABNORMAL HIGH (ref 20.0–28.0)
O2 Content: 3 L/min
O2 Saturation: 100 %
Patient temperature: 37
pCO2 arterial: 69 mmHg (ref 32–48)
pH, Arterial: 7.26 — ABNORMAL LOW (ref 7.35–7.45)
pO2, Arterial: 156 mmHg — ABNORMAL HIGH (ref 83–108)

## 2022-03-11 LAB — AMMONIA: Ammonia: <10 umol/L (ref 9–35)

## 2022-03-11 MED ORDER — DEXTROSE 10 % IV SOLN: INTRAVENOUS | Status: DC

## 2022-03-11 MED ORDER — VANCOMYCIN HCL IN DEXTROSE 1-5 GM/200ML-% IV SOLN
1000.0000 mg | Freq: Once | INTRAVENOUS | Status: DC
  Filled 2022-03-11: qty 200

## 2022-03-11 MED ORDER — SODIUM CHLORIDE 0.9 % IV SOLN
2.0000 g | Freq: Two times a day (BID) | INTRAVENOUS | Status: DC
  Filled 2022-03-11 (×3): qty 2000

## 2022-03-11 MED ORDER — DEXTROSE 50 % IV SOLN
12.5000 g | INTRAVENOUS | Status: AC
  Administered 2022-03-11: 12.5 g via INTRAVENOUS

## 2022-03-11 MED ORDER — VANCOMYCIN HCL 1500 MG/300ML IV SOLN
1500.0000 mg | Freq: Once | INTRAVENOUS | Status: DC
  Filled 2022-03-11: qty 300

## 2022-03-11 MED ORDER — DEXTROSE 50 % IV SOLN
INTRAVENOUS | Status: AC
  Filled 2022-03-11: qty 50

## 2022-03-11 MED ORDER — NALOXONE HCL 0.4 MG/ML IJ SOLN
0.4000 mg | Freq: Once | INTRAMUSCULAR | Status: AC
  Administered 2022-03-11: 0.4 mg via INTRAVENOUS
  Filled 2022-03-11: qty 1

## 2022-03-11 MED ORDER — LEVETIRACETAM IN NACL 1000 MG/100ML IV SOLN
1000.0000 mg | Freq: Once | INTRAVENOUS | Status: DC
  Filled 2022-03-11: qty 100

## 2022-03-11 MED ORDER — SODIUM CHLORIDE 0.9 % IV SOLN
2.0000 g | Freq: Two times a day (BID) | INTRAVENOUS | Status: DC
  Filled 2022-03-11 (×3): qty 20

## 2022-03-11 MED ORDER — DEXTROSE 50 % IV SOLN: 12.5000 g | INTRAVENOUS | Status: DC | PRN

## 2022-03-11 MED ORDER — SODIUM CHLORIDE 0.9 % IV SOLN
2000.0000 mg | Freq: Once | INTRAVENOUS | Status: DC
  Filled 2022-03-11: qty 20

## 2022-03-11 MED ORDER — DEXTROSE 5 % IV SOLN
5.0000 mg/kg | INTRAVENOUS | Status: DC
  Filled 2022-03-11 (×2): qty 9.6

## 2022-03-11 MED ORDER — VANCOMYCIN HCL IN DEXTROSE 1-5 GM/200ML-% IV SOLN
1000.0000 mg | INTRAVENOUS | Status: DC
  Filled 2022-03-11: qty 200

## 2022-03-11 MED ORDER — LEVETIRACETAM IN NACL 1000 MG/100ML IV SOLN
1000.0000 mg | Freq: Once | INTRAVENOUS | Status: AC
  Administered 2022-03-11: 1000 mg via INTRAVENOUS
  Filled 2022-03-11: qty 100

## 2022-03-11 MED ORDER — SODIUM CHLORIDE 0.9 % IV SOLN
750.0000 mg | Freq: Two times a day (BID) | INTRAVENOUS | Status: DC
  Administered 2022-03-11: 750 mg via INTRAVENOUS
  Filled 2022-03-11 (×3): qty 7.5

## 2022-03-11 NOTE — Progress Notes (Signed)
SLP Cancellation Note  Patient Details Name: Desten Manor MRN: [id number] DOB: 24-Jun-1960   Cancelled treatment:       Reason Eval/Treat Not Completed: Patient at procedure or test/unavailable (pt is off the floow at HD) Received order; briefly reviewed chart. Pt is at HD off the floor currently. ST services will f/u tomorrow w/ BSE. Recommend general aspiration precautions and Pills in Puree w/ NSG if indicated. NSG updated.      Orinda Kenner, MS, CCC-SLP Speech Language Pathologist Rehab Services; [employer] [phone] (ascom) [place] 03/11/2022, 12:14 PM

## 2022-03-11 NOTE — Progress Notes (Signed)
Eeg done 

## 2022-03-11 NOTE — Progress Notes (Signed)
Progress Note   Patient: Adam Howell EXH:[id number] DOB: Aug 06, 1960 DOA: 03/05/2022     6 DOS: the patient was seen and examined on 03/11/2022   Brief hospital course:  Adam Howell is a 61 y.o. male with medical history significant for diabetes mellitus with complications of end-stage renal disease on hemodialysis (M/W/F), history of A-fib on Eliquis and amiodarone, HFpEF (EF 35 to 40% 2021), secondary hyperparathyroidism with chronic nonhealing right lower extremity wound for which he was hospitalized for debridement and IV antibiotics from 10/31 to 11/6.  He was sent to the ED by his home health nurse due to concerns for increased drainage and foul odor from the wound in spite of ongoing dressings.  Patient also reports increased pain of the lower leg.   MRI right tib-fib suggesting myositis without osteomyelitis. Patient was started on Zosyn and vancomycin.  Patient was taken to the OR for debridement on 12/23.  Wound culture only grew out gram-negative rods, antibiotics switched to Zosyn only.  Patient had a prolonged episode of altered mental status postop, currently improved.  Had another episode of decreased responsiveness on 12/26.  Seen by neurology, most likely metabolic encephalopathy.  MRI of the brain reported as new stroke, neurology has reviewed it, does not believe this is stroke. 12/27.  Patient has dense encephalopathy, started on acyclovir for possible viral encephalitis.  ID consult.       Assessment and Plan: Acute metabolic encephalopathy. Hypotension. Patient had a prolonged altered mental status post op due to end-stage renal disease.  Blood pressure was low overnight, received fluid bolus.  Mental status finally improved. Patient had another episode of mental status change am of 12/26, he was confused, minimally responsive.  Code stroke was called, seen by Dr. Cheral Marker, does not appear to have stroke, I reviewed the CT images together with exam, he appears  to have some old stroke, no new stroke at this time.  MRI was also performed, reportedly a new stroke, but reviewed with Dr. Cheral Marker, does not appear to be stroke.  EEG is consistent with metabolic encephalopathy.  Patient mental status does not appear to be improving today, discussed with Dr. Cheral Marker. Possibility of meningitis with possible seizure.  Repeat EEG again, also started on Keppra and IV acyclovir.  Antibiotics will be changed to vancomycin and Rocephin.  ID consult obtained.  Patient also has some hypoglycemia from poor p.o. intake, I will discontinue sliding scale insulin, added with D10 infusion given additional to D50 as needed.  Patient did not have hypoglycemia yesterday.  Probably not the source of her mental status changes. Will also ask Dr. Lucky Cowboy to look at the patient wound, patient has a wound VAC in place. Patient will be dialyzed again today for nephrology.    Wound infection of chronic right lower extremity ulcer Blood culture negative. Patient had a debridement performed 12/23.  Gram stain shows gram negative rods, culture only has gram-negative rods.  Discontinue vancomycin, continue Zosyn for now. Culture still not finalized, on Zosyn.   Chronic systolic CHF (congestive heart failure) (HCC) Condition stable without volume overload.   Type 2 diabetes mellitus with ESRD (end-stage renal disease) (HCC) Hypoglycemia. Discontinued sliding scale insulin, started D10 drip.   ESRD on dialysis [facility]) Patient does not have volume overload, followed by nephrology for scheduled dialysis.   Chronic Atrial fibrillation (HCC) Continue Eliquis and amiodarone   Anemia in chronic kidney disease Stable.   Hyperlipidemia associated with type 2 diabetes mellitus (HCC) Continue rosuvastatin  Obesity with BMI 31.87      Subjective:  Patient is still very confused, open eyes, but not talking.  Some myoclonic jerking movement. Still on 2 L oxygen, does not have any  respiratory distress.  Physical Exam: Vitals:   03/10/22 2046 03/10/22 2355 03/11/22 0418 03/11/22 0805  BP: 127/68  (!) 107/46 (!) 106/57  Pulse: 71  83 86  Resp: 20  20 18   Temp: 98.2 F (36.8 C)  98.6 F (37 C) 97.6 F (36.4 C)  TempSrc: Oral  Oral   SpO2: 100% 95% 100% 100%  Weight:      Height:       General exam: Ill-appearing, no acute distress. Respiratory system: Clear to auscultation. Respiratory effort normal. Cardiovascular system: S1 & S2 heard, RRR. No JVD, murmurs, rubs, gallops or clicks. No pedal edema. Gastrointestinal system: Abdomen is nondistended, soft and nontender. No organomegaly or masses felt. Normal bowel sounds heard. Central nervous system: Opening eyes, confused.  Some myoclonic jerking movement. Extremities: Symmetric 5 x 5 power. Skin: No rashes, lesions or ulcers    Data Reviewed:  Reviewed MRI results, all lab results.  Family Communication: Daughter updated over the phone.  Disposition: Status is: Inpatient Remains inpatient appropriate because: Severity of disease, altered mental status, multiple IV treatment.  Planned Discharge Destination:  TBD    Time spent: 50 minutes  Author: Sharen Hones, MD 03/11/2022 10:24 AM  For on call review [email or web address].

## 2022-03-11 NOTE — Progress Notes (Signed)
PT Cancellation Note  Patient Details Name: Adam Howell MRN: [id number] DOB: [birth year]   Cancelled Treatment:    Reason Eval/Treat Not Completed: Patient not medically ready. Orders received and chart reviewed. Pt awake but unable to communicate verbally. Per RN at bedside pt remains in same state as described yesterday. MD at bedside stating pt to get HD today. Pt is total A at torso to reposition LE's and torso sitting upright in bed. PT and RN in agreement to hold until after HD due to current state. PT to re-attempt this afternoon after HD if pt is medically appropriate and available.    Salem Caster. Fairly IV, PT, DPT Physical Therapist- [facility]  03/11/2022, 8:53 AM

## 2022-03-11 NOTE — Progress Notes (Signed)
Patient still asleep, left on bipap for now

## 2022-03-11 NOTE — Progress Notes (Signed)
Seen patient again in dialysis, oxygen saturation 100% on 2 L.  Reviewed ABG earlier, patient receiving too much oxygen, turned off oxygen, sats now 96%.  Discussed with staff, also discussed with RT earlier, will keep oxygen saturation between 88 to 90%.

## 2022-03-11 NOTE — Consult Note (Signed)
NAME: Bardia Wangerin  DOB: 11/04/1960  MRN: [id number]  Date/Time: 03/11/2022 10:41 AM  REQUESTING PROVIDER: Dr.Zhang Subjective:  REASON FOR CONSULT: encephalitis VS encephalopathy ?No history from patient- chart reviewed thoroughly Levent Kornegay is a 61 y.o. with a history of ESRD, DM, AFIB, HFrEF, chronic lrt lower extremity wound for which he has had couple of hospitalizations recently, PAD, on Norco came to the ED on 12/21 as the wound showed exposed bone. His wound nurse at home was concerned that the bone was exposed and sent him to the ED  03/05/22  BP 102/70  Temp 98.4 F (36.9 C)  Pulse Rate 103 !  Resp 18  SpO2 98 %    Latest Reference Range & Units 03/05/22  WBC 4.0 - 10.5 K/uL 9.1  Hemoglobin 13.0 - 17.0 g/dL 12.9 (L)  HCT 39.0 - 52.0 % 40.5  Platelets 150 - 400 K/uL 350  Creatinine 0.61 - 1.24 mg/dL 7.14 (H)   MRI of the extremity showed deep skin wound with no evidence of osteo BC neg Wound culture sent and patient started on zosyn He was taken to OR on 12/23 for debridement - had prolonged altered mental status post surgery I am asked to see the patient as his mental status is getting worse Pt last had 3 hours of dialysis on 03/06/22. He got Morphine 30mg  BID on 03/06/22 On 12/23 he had surgery- that day he got 150MCG of fentanyl-, 2mg  of dilaudid, 30mg  of morphine and gabapentin 600mg  On 03/08/22 He received 30mg  of morphine Qd. On 12/24 they tried to do dilaysis for 28 min and stopped because of hypotension  on 12/25 he got 30mg  of morphine On 12/26 he also received 2 mg of ativan He has been having worsening mental status for the past 24 hrs His last dialysis was 5 days ago No fever  Past Medical History:  Diagnosis Date   Chronic kidney disease    Congestive heart failure ([place])    Diabetes mellitus without complication ([place])    Hyperlipidemia    Hypertension     Past Surgical History:  Procedure Laterality Date   APPLICATION OF WOUND VAC  Right 03/07/2022   Procedure: APPLICATION OF WOUND VAC;  Surgeon: Evaristo Bury, MD;  Location: [facility] ORS;  Service: Vascular;  Laterality: Right;  GAAC [id number]   WOUND DEBRIDEMENT Left    WOUND DEBRIDEMENT Right 01/16/2022   Procedure: DEBRIDEMENT WOUND;  Surgeon: Katha Cabal, MD;  Location: [facility] ORS;  Service: Vascular;  Laterality: Right;  Wound vac placement   WOUND DEBRIDEMENT Right 03/07/2022   Procedure: DEBRIDEMENT WOUND RIGHT LOWER EXTREMITY;  Surgeon: Evaristo Bury, MD;  Location: [facility] ORS;  Service: Vascular;  Laterality: Right;    Social History   Socioeconomic History   Marital status: Single    Spouse name: Not on file   Number of children: Not on file   Years of education: Not on file   Highest education level: Not on file  Occupational History   Not on file  Tobacco Use   Smoking status: Never   Smokeless tobacco: Never  Vaping Use   Vaping Use: Never used  Substance and Sexual Activity   Alcohol use: Not Currently   Drug use: Never   Sexual activity: Not Currently  Other Topics Concern   Not on file  Social History Narrative   Not on file   Social Determinants of Health   Financial Resource Strain: Low Risk  (11/25/2021)  Overall Financial Resource Strain (CARDIA)    Difficulty of Paying Living Expenses: Not hard at all  Food Insecurity: Food Insecurity Present (03/06/2022)   Hunger Vital Sign    Worried About Running Out of Food in the Last Year: Never true    Ran Out of Food in the Last Year: Sometimes true  Transportation Needs: No Transportation Needs (03/06/2022)   PRAPARE - Hydrologist (Medical): No    Lack of Transportation (Non-Medical): No  Physical Activity: Insufficiently Active (11/25/2021)   Exercise Vital Sign    Days of Exercise per Week: 2 days    Minutes of Exercise per Session: 60 min  Stress: No Stress Concern Present (11/25/2021)   [place]    Feeling of Stress : Not at all  Social Connections: Moderately Isolated (11/25/2021)   Social Connection and Isolation Panel [NHANES]    Frequency of Communication with Friends and Family: More than three times a week    Frequency of Social Gatherings with Friends and Family: Three times a week    Attends Religious Services: More than 4 times per year    Active Member of Clubs or Organizations: No    Attends Archivist Meetings: Never    Marital Status: Never married  Intimate Partner Violence: Not At Risk (03/06/2022)   Humiliation, Afraid, Rape, and Kick questionnaire    Fear of Current or Ex-Partner: No    Emotionally Abused: No    Physically Abused: No    Sexually Abused: No    Family History  Problem Relation Age of Onset   Heart failure Mother    Cirrhosis Father    Hypertension Daughter    Allergies  Allergen Reactions   Tramadol Rash   I? Current Facility-Administered Medications  Medication Dose Route Frequency Provider Last Rate Last Admin   (feeding supplement) PROSource Plus liquid 30 mL  30 mL Oral BID BM Sharen Hones, MD   30 mL at 03/09/22 1158   acetaminophen (TYLENOL) tablet 650 mg  650 mg Oral Q6H PRN Athena Masse, MD   650 mg at 03/06/22 1604   Or   acetaminophen (TYLENOL) suppository 650 mg  650 mg Rectal Q6H PRN Athena Masse, MD       alteplase (CATHFLO ACTIVASE) injection 2 mg  2 mg Intracatheter Once PRN Colon Flattery, NP       amiodarone (PACERONE) tablet 200 mg  200 mg Oral BID Judd Gaudier V, MD   200 mg at 03/09/22 0901   anticoagulant sodium citrate solution 5 mL  5 mL Intracatheter PRN Colon Flattery, NP       apixaban (ELIQUIS) tablet 2.5 mg  2.5 mg Oral BID Dorothe Pea, RPH   2.5 mg at 03/09/22 0900   bumetanide (BUMEX) tablet 1 mg  1 mg Oral BID Athena Masse, MD   1 mg at 03/09/22 0901   Chlorhexidine Gluconate Cloth 2 % PADS 6 each  6 each Topical Q0600 Colon Flattery, NP   6 each at 03/11/22  0421   dextrose 10 % infusion   Intravenous Continuous Sharen Hones, MD       dextrose 50 % solution 12.5 g  12.5 g Intravenous PRN Sharen Hones, MD       heparin injection 1,000 Units  1,000 Units Intracatheter PRN Colon Flattery, NP       levETIRAcetam (KEPPRA) 750 mg in sodium chloride 0.9 % 100  mL IVPB  750 mg Intravenous Q12H Kerney Elbe, MD       levETIRAcetam (KEPPRA) IVPB 1000 mg/100 mL premix  1,000 mg Intravenous Once Alison Murray, RPH       Followed by   levETIRAcetam (KEPPRA) IVPB 1000 mg/100 mL premix  1,000 mg Intravenous Once Alison Murray, RPH       lidocaine (PF) (XYLOCAINE) 1 % injection 5 mL  5 mL Intradermal PRN Breeze, Benancio Deeds, NP       lidocaine-prilocaine (EMLA) cream 1 Application  1 Application Topical PRN Colon Flattery, NP       morphine (MS CONTIN) 12 hr tablet 15 mg  15 mg Oral Q12H Sharen Hones, MD       multivitamin (RENA-VIT) tablet 1 tablet  1 tablet Oral QHS Sharen Hones, MD   1 tablet at 03/08/22 2151   nutrition supplement (JUVEN) (JUVEN) powder packet 1 packet  1 packet Oral BID BM Sharen Hones, MD   1 packet at 03/09/22 1157   ondansetron (ZOFRAN) tablet 4 mg  4 mg Oral Q6H PRN Athena Masse, MD       Or   ondansetron [facility]) injection 4 mg  4 mg Intravenous Q6H PRN Athena Masse, MD       oxyCODONE-acetaminophen (PERCOCET/ROXICET) 5-325 MG per tablet 1 tablet  1 tablet Oral Q4H PRN Sharen Hones, MD       pentafluoroprop-tetrafluoroeth Landry Dyke) aerosol 1 Application  1 Application Topical PRN Colon Flattery, NP   1 Application at 05/39/76 1547   piperacillin-tazobactam (ZOSYN) IVPB 2.25 g  2.25 g Intravenous Q8H Judd Gaudier V, MD 100 mL/hr at 03/11/22 0420 2.25 g at 03/11/22 0420   rosuvastatin (CRESTOR) tablet 40 mg  40 mg Oral Daily Judd Gaudier V, MD   40 mg at 03/09/22 0900   sertraline (ZOLOFT) tablet 50 mg  50 mg Oral Daily Athena Masse, MD   50 mg at 03/09/22 0901   tiZANidine (ZANAFLEX) tablet 2 mg  2 mg Oral Daily  PRN Athena Masse, MD         Abtx:  Anti-infectives (From admission, onward)    Start     Dose/Rate Route Frequency Ordered Stop   03/06/22 1200  vancomycin (VANCOCIN) IVPB 1000 mg/200 mL premix  Status:  Discontinued        1,000 mg 200 mL/hr over 60 Minutes Intravenous Every M-W-F (Hemodialysis) 03/06/22 0117 03/09/22 1121   03/06/22 0400  piperacillin-tazobactam (ZOSYN) IVPB 2.25 g        2.25 g 100 mL/hr over 30 Minutes Intravenous Every 8 hours 03/06/22 0153     03/06/22 0130  piperacillin-tazobactam (ZOSYN) IVPB 2.25 g  Status:  Discontinued        2.25 g 100 mL/hr over 30 Minutes Intravenous Every 8 hours 03/06/22 0118 03/06/22 0153   03/06/22 0115  vancomycin (VANCOCIN) IVPB 1000 mg/200 mL premix        1,000 mg 200 mL/hr over 60 Minutes Intravenous  Once 03/06/22 0114 03/06/22 0316   03/05/22 2100  vancomycin (VANCOREADY) IVPB 1500 mg/300 mL  Status:  Discontinued       See Hyperspace for full Linked Orders Report.   1,500 mg 150 mL/hr over 120 Minutes Intravenous  Once 03/05/22 1828 03/05/22 1921   03/05/22 1930  vancomycin (VANCOREADY) IVPB 1500 mg/300 mL        1,500 mg 150 mL/hr over 120 Minutes Intravenous  Once 03/05/22 1921 03/05/22 2231   03/05/22 1830  vancomycin (VANCOREADY) IVPB 1500 mg/300 mL  Status:  Discontinued        1,500 mg 150 mL/hr over 120 Minutes Intravenous  Once 03/05/22 1825 03/05/22 1828   03/05/22 1830  piperacillin-tazobactam (ZOSYN) IVPB 3.375 g        3.375 g 100 mL/hr over 30 Minutes Intravenous  Once 03/05/22 1825 03/05/22 2027   03/05/22 1830  vancomycin (VANCOCIN) IVPB 1000 mg/200 mL premix  Status:  Discontinued       See Hyperspace for full Linked Orders Report.   1,000 mg 200 mL/hr over 60 Minutes Intravenous  Once 03/05/22 1828 03/05/22 1921       REVIEW OF SYSTEMS:  Un available due to his mental status Objective:  VITALS:  BP (!) 95/53 (BP Location: Right Arm)   Pulse 90   Temp 97.6 F (36.4 C)   Resp 16   Ht 6\' 3"   (1.905 m)   Wt 114 kg   SpO2 99%   BMI 31.41 kg/m   PHYSICAL EXAM:  General:lethargic somnolent, myoclonus, and twiching Opens eyes to calling his name,but non verbal- withdraws to painful stimulus Head: Normocephalic, without obvious abnormality, atraumatic. Eyes: Conjunctivae clear, anicteric sclerae. Pupils are equal ENT Nares normal. No drainage or sinus tenderness. Cannot examine Neck:, symmetrical, no adenopathy, thyroid: non tender no carotid bruit and no JVD. Back: did not examine Lungs: b/la ir entry Heart: s1s2 Abdomen: Soft, non-tender,not distended. Bowel sounds normal. No masses Extremities: rt leg surgical dressing not removed Wound vac       Skin: No rashes or lesions. Or bruising Lymph: Cervical, supraclavicular normal. Neurologic: as above Pertinent Labs Lab Results CBC    Component Value Date/Time   WBC 7.9 03/10/2022 1706   RBC 3.56 (L) 03/10/2022 1706   HGB 10.0 (L) 03/10/2022 1706   HGB 14.4 02/03/2022 1441   HCT 31.5 (L) 03/10/2022 1706   HCT 44.0 02/03/2022 1441   PLT 353 03/10/2022 1706   PLT 200 02/03/2022 1441   MCV 88.5 03/10/2022 1706   MCV 87 02/03/2022 1441   MCH 28.1 03/10/2022 1706   MCHC 31.7 03/10/2022 1706   RDW 17.1 (H) 03/10/2022 1706   RDW 14.8 02/03/2022 1441   LYMPHSABS 0.8 03/05/2022 1323   LYMPHSABS 0.8 02/03/2022 1441   MONOABS 0.7 03/05/2022 1323   EOSABS 0.2 03/05/2022 1323   EOSABS 0.4 02/03/2022 1441   BASOSABS 0.1 03/05/2022 1323   BASOSABS 0.1 02/03/2022 1441       Latest Ref Rng & Units 03/10/2022    5:06 PM 03/08/2022    9:15 AM 03/07/2022    7:36 AM  CMP  Glucose 70 - 99 mg/dL 78  103  108   BUN 6 - 20 mg/dL 53  35  24   Creatinine 0.61 - 1.24 mg/dL 12.03  8.74  6.60   Sodium 135 - 145 mmol/L 133  135  134   Potassium 3.5 - 5.1 mmol/L 5.2  4.1  4.4   Chloride 98 - 111 mmol/L 93  95  94   CO2 22 - 32 mmol/L 25  26  30    Calcium 8.9 - 10.3 mg/dL 8.6  8.4  8.2       Microbiology: Recent  Results (from the past 240 hour(s))  Culture, blood (routine x 2)     Status: None   Collection Time: 03/05/22  7:43 PM   Specimen: BLOOD  Result Value Ref Range Status   Specimen Description BLOOD BLOOD RIGHT ARM  Final   Special Requests   Final    BOTTLES DRAWN AEROBIC AND ANAEROBIC Blood Culture results may not be optimal due to an inadequate volume of blood received in culture bottles   Culture   Final    NO GROWTH 5 DAYS Performed at [facility], [address] [place], [place] [address]    Report Status 03/10/2022 FINAL  Final  Surgical PCR screen     Status: None   Collection Time: 03/06/22 11:31 PM   Specimen: Nasal Mucosa; Nasal Swab  Result Value Ref Range Status   MRSA, PCR NEGATIVE NEGATIVE Final   Staphylococcus aureus NEGATIVE NEGATIVE Final    Comment: (NOTE) The Xpert SA Assay (FDA approved for NASAL specimens in patients 63 years of age and older), is one component of a comprehensive surveillance program. It is not intended to diagnose infection nor to guide or monitor treatment. Performed at [facility] [address] [place], [place] [address]   Aerobic/Anaerobic Culture w Gram Stain (surgical/deep wound)     Status: None (Preliminary result)   Collection Time: 03/07/22  9:50 AM   Specimen: Wound  Result Value Ref Range Status   Specimen Description   Final    WOUND Performed at [facility], [address] [place], [place] [address]    Special Requests   Final    right lower extremity wound Performed at [facility], [address] [place], [place] [address]    Gram Stain   Final    FEW GRAM NEGATIVE RODS RARE WBC PRESENT, PREDOMINANTLY PMN    Culture   Final    FEW CITROBACTER KOSERI FEW ENTEROCOCCUS FAECIUM FEW PROTEUS MIRABILIS SUSCEPTIBILITIES TO FOLLOW NO ANAEROBES ISOLATED Performed at [facility], [place] [address], [place], [place] [address]    Report Status PENDING  Incomplete    Organism ID, Bacteria PROTEUS MIRABILIS  Final      Susceptibility   Proteus mirabilis - MIC*    AMPICILLIN <=2 SENSITIVE Sensitive     CEFAZOLIN 8 SENSITIVE Sensitive     CEFEPIME 0.25 SENSITIVE Sensitive     CEFTAZIDIME <=1 SENSITIVE Sensitive     CEFTRIAXONE 0.5 SENSITIVE Sensitive     CIPROFLOXACIN <=0.25 SENSITIVE Sensitive     GENTAMICIN <=1 SENSITIVE Sensitive     IMIPENEM 2 SENSITIVE Sensitive     TRIMETH/SULFA <=20 SENSITIVE Sensitive     AMPICILLIN/SULBACTAM <=2 SENSITIVE Sensitive     PIP/TAZO <=4 SENSITIVE Sensitive     * FEW PROTEUS MIRABILIS    IMAGING RESULTS: MRI no evidence of osteo I have personally reviewed the films ? Impression/Recommendation Encephalopathy - combination of uremic encephalopathy + opioids+ co2 narcosis compounded by not getting dialysis in 5 days Last got full dialysis 5 days ago on 03/06/22 and since then has had too much opioids that have accumulated in his system, and also received ativan, gabapentin . Also has Co2 accumulation- Co2 narcosis with underlying sleep apena No evidence clinically or by history to suggest meningitis or encephalitis- For the first 48 hrs since admission he was awake and alert and it will be unlikely he developed meningitis/encephalitis in th hospital. so will not start Acyclovir( which can cause seizures in dialysis patients) vanco, ampicillin or ceftriaxone Pt needs proper full  dialysis and may  have to repeat it tomorrow as well. ? Narcan MRI brain showed 2  small acute or subacute infarcts in the right cerebral white matter. 2. Prior lacunar and left frontoparietal  cortex infarcts. 3. Remote hemorrhagic insult in the right cerebellum EEG no evidence of seizures Neuro following  Sleep apnea - co2 retention    Rt leg wound- chronic- the wound itself looks okay , beefy red with tendon exposed but surrounding discoloration  Arteritic ulcer with likely poor circulation. Wound culture gram neg rods and on  zosyn Also in October had proteus and klebsiella and was treated with Iv followed by PO antibiotic Has a wound vac and hence wound could not be examined but MRI did not show any nec fasc. No fever or leucocytosis Vascular recommending rt AKA  HFrEF Afib- on amiodarone  Anemia  Discussed the management with requesting provider and care team  Note:  This document was prepared using Dragon voice recognition software and may include unintentional dictation errors.

## 2022-03-11 NOTE — Progress Notes (Addendum)
Pharmacy Antibiotic Note  Adam Howell is a 61 y.o. male admitted on 03/05/2022 with  wound infection .  Pharmacy has been consulted for Zosyn dosing.  Pt is ESRD-HD (MWF).  Assessment: 61 yo M with PMH ESRD-HD (MWF), Afib (on Eliquis), HFrEF (LVEF 35%), DM, chronic RLE wound presents with increased drainage, foul odor from wound, and pain. MRI c/f myositis without osteomyelitis. Pt taken to OR for debridement on 12/23; OR cultures growing Citrobacter koseri, E. faecium, P. mirabilis, susceptibilities pending. Pt is afebrile, VSS on CPAP and 2 L [place], without leukocytosis.  Plan: Day # 6 of antibiotics Continue Zosyn 2.25 g IV q8H Follow up culture results to assess for antibiotic optimization  Height: 6\' 3"  (190.5 cm) Weight: 114 kg (251 lb 5.2 oz) IBW/kg (Calculated) : 84.5  Temp (24hrs), Avg:98.1 F (36.7 C), Min:97.6 F (36.4 C), Max:98.6 F (37 C)  Recent Labs  Lab 03/05/22 1323 03/05/22 1943 03/07/22 0736 03/08/22 0915 03/08/22 0947 03/10/22 1706  WBC 9.1  --  11.5*  --  11.8* 7.9  CREATININE 7.14*  --  6.60* 8.74*  --  12.03*  LATICACIDVEN 1.3 1.3  --   --   --   --      Estimated Creatinine Clearance: 8.9 mL/min (A) (by C-G formula based on SCr of 12.03 mg/dL (H)).    Allergies  Allergen Reactions   Tramadol Rash    Antimicrobials this admission: Zosyn 12/21 >>  Vancomycin 12/21 >> 12/24  Dose adjustments this admission: N/A  Microbiology results: 12/21 BCx: NGF 12/22 MRSA PCR: Negative 12/23 Wound cx: Few Citrobacter koseri, E. faecium, P. mirabilis (susc pending)  Thank you for allowing pharmacy to be a part of this patient's care.  Will M. Ouida Sills, PharmD PGY-1 Pharmacy Resident 03/11/2022 8:47 AM

## 2022-03-11 NOTE — Progress Notes (Signed)
Hemodialysis Note  Received patient in bed to unit. Alert and oriented. Informed consent signed and in chart.   Treatment initiated:1045 Treatment completed:1429  Patient tolerated treatment well. Transported back to the room alert, without acute distress. Report given to patient's RN.  Access used:LFA AVF Access issues: None   Total UF removed:84.01 ml Medications given:None  Post HD VS: At baseline  Post HD weight:111.6 kg  Forrest Moron, RN [facility]

## 2022-03-11 NOTE — Progress Notes (Signed)
OT Cancellation Note  Patient Details Name: Adam Howell MRN: [id number] DOB: [birth year]   Cancelled Treatment:    Reason Eval/Treat Not Completed: Patient at procedure or test/ unavailable. OT order received and chart reviewed. Pt currently off the floor for dialysis. OT to re-attempt OT evaluation when pt is next available.   Darleen Crocker, MS, OTR/L , CBIS ascom [phone]  03/11/22, 12:36 PM

## 2022-03-11 NOTE — Progress Notes (Addendum)
Subjective: Appears worsened today. Not following any commands.   Objective: Current vital signs: BP (!) 106/57 (BP Location: Right Arm)   Pulse 86   Temp 97.6 F (36.4 C)   Resp 18   Ht 6\' 3"  (1.905 m)   Wt 114 kg   SpO2 100%   BMI 31.41 kg/m  Vital signs in last 24 hours: Temp:  [97.6 F (36.4 C)-98.6 F (37 C)] 97.6 F (36.4 C) (12/27 0805) Pulse Rate:  [71-86] 86 (12/27 0805) Resp:  [15-20] 18 (12/27 0805) BP: (88-135)/(39-117) 106/57 (12/27 0805) SpO2:  [95 %-100 %] 100 % (12/27 0805) FiO2 (%):  [21 %] 21 % (12/27 0753)  Intake/Output from previous day: 12/26 0701 - 12/27 0700 In: 63.3 [IV Piggyback:63.3] Out: 0  Intake/Output this shift: No intake/output data recorded. Nutritional status:  Diet Order             Diet heart healthy/carb modified Room service appropriate? Yes; Fluid consistency: Thin  Diet effective now                   Physical Exam  HEENT-  [place]/AT. Skin to forehead is warm. Mildly increased tone noted with passive neck flexion, but normal tone in rotation.  Lungs- No tachypnea or increased WOB Extremities- Wound vac and dressing to distal RLE   Neurological Examination Mental Status: Eyes open minimally to sternal rub. He is unable to follow any commands, which is worsened from yesterday when he followed 25% of all simple commands with increased latencies of motor responses. He murmurs unintelligibly, which is worsened from yesterday, when he was able to state his name with dysarthric speech. Face is symmetric. Intermittent myoclonic-like twitching of facial muscles bilaterally is noted, including the forehead. Does not make any eye contact and does not fixate upon or track on other visual stimuli.  Cranial Nerves: II: No blink to threat. PERRL.   III,IV, VI: Eyes are conjugate. Does not track.   V: Reacts to eyelid stimulation bilaterally.  VII: Face is symmetric VIII: Unable to assess IX,X: Gag reflex deferred XI: Head is  midline XII: Does not follow commands for testing.  Motor: Does not follow motor commands. When BUE are passively elevated and released, they fall back to the bed with minimal resistance against gravity (worsened from yesterday).  Minimal movement of BLE to stimulation of feet.   Sensory: No significant change.   Deep Tendon Reflexes: 2+ bilateral brachioradialis. 1+ bilateral patellae. Toes equivocal.  Cerebellar: Does not follow commands for testing.  Gait: Unable to assess  Lab Results: Results for orders placed or performed during the hospital encounter of 03/05/22 (from the past 48 hour(s))  Glucose, capillary     Status: None   Collection Time: 03/09/22 11:19 AM  Result Value Ref Range   Glucose-Capillary 85 70 - 99 mg/dL    Comment: Glucose reference range applies only to samples taken after fasting for at least 8 hours.  Glucose, capillary     Status: None   Collection Time: 03/09/22  4:40 PM  Result Value Ref Range   Glucose-Capillary 83 70 - 99 mg/dL    Comment: Glucose reference range applies only to samples taken after fasting for at least 8 hours.  Glucose, capillary     Status: None   Collection Time: 03/09/22  9:57 PM  Result Value Ref Range   Glucose-Capillary 79 70 - 99 mg/dL    Comment: Glucose reference range applies only to samples taken after fasting for  at least 8 hours.  Glucose, capillary     Status: None   Collection Time: 03/10/22  7:38 AM  Result Value Ref Range   Glucose-Capillary 84 70 - 99 mg/dL    Comment: Glucose reference range applies only to samples taken after fasting for at least 8 hours.   Comment 1 Notify RN    Comment 2 Document in Chart   Glucose, capillary     Status: None   Collection Time: 03/10/22 12:44 PM  Result Value Ref Range   Glucose-Capillary 84 70 - 99 mg/dL    Comment: Glucose reference range applies only to samples taken after fasting for at least 8 hours.   Comment 1 Notify RN    Comment 2 Document in Chart   Glucose,  capillary     Status: None   Collection Time: 03/10/22  5:05 PM  Result Value Ref Range   Glucose-Capillary 91 70 - 99 mg/dL    Comment: Glucose reference range applies only to samples taken after fasting for at least 8 hours.   Comment 1 Notify RN    Comment 2 Document in Chart   CBC     Status: Abnormal   Collection Time: 03/10/22  5:06 PM  Result Value Ref Range   WBC 7.9 4.0 - 10.5 K/uL   RBC 3.56 (L) 4.22 - 5.81 MIL/uL   Hemoglobin 10.0 (L) 13.0 - 17.0 g/dL   HCT 31.5 (L) 39.0 - 52.0 %   MCV 88.5 80.0 - 100.0 fL   MCH 28.1 26.0 - 34.0 pg   MCHC 31.7 30.0 - 36.0 g/dL   RDW 17.1 (H) 11.5 - 15.5 %   Platelets 353 150 - 400 K/uL   nRBC 0.0 0.0 - 0.2 %    Comment: Performed at [facility], [address], [place], [place] [address]  Basic metabolic panel     Status: Abnormal   Collection Time: 03/10/22  5:06 PM  Result Value Ref Range   Sodium 133 (L) 135 - 145 mmol/L   Potassium 5.2 (H) 3.5 - 5.1 mmol/L   Chloride 93 (L) 98 - 111 mmol/L   CO2 25 22 - 32 mmol/L   Glucose, Bld 78 70 - 99 mg/dL    Comment: Glucose reference range applies only to samples taken after fasting for at least 8 hours.   BUN 53 (H) 6 - 20 mg/dL   Creatinine, Ser 12.03 (H) 0.61 - 1.24 mg/dL   Calcium 8.6 (L) 8.9 - 10.3 mg/dL   GFR, Estimated 4 (L) >60 mL/min    Comment: (NOTE) Calculated using the CKD-EPI Creatinine Equation (2021)    Anion gap 15 5 - 15    Comment: Performed at [facility], [address] [place], [place] [address]  Glucose, capillary     Status: None   Collection Time: 03/10/22  8:55 PM  Result Value Ref Range   Glucose-Capillary 75 70 - 99 mg/dL    Comment: Glucose reference range applies only to samples taken after fasting for at least 8 hours.  Glucose, capillary     Status: Abnormal   Collection Time: 03/11/22 12:01 AM  Result Value Ref Range   Glucose-Capillary 61 (L) 70 - 99 mg/dL    Comment: Glucose reference range applies only to samples taken  after fasting for at least 8 hours.  Glucose, capillary     Status: Abnormal   Collection Time: 03/11/22 12:25 AM  Result Value Ref Range   Glucose-Capillary 107 (  H) 70 - 99 mg/dL    Comment: Glucose reference range applies only to samples taken after fasting for at least 8 hours.  Glucose, capillary     Status: Abnormal   Collection Time: 03/11/22  8:06 AM  Result Value Ref Range   Glucose-Capillary 64 (L) 70 - 99 mg/dL    Comment: Glucose reference range applies only to samples taken after fasting for at least 8 hours.    Recent Results (from the past 240 hour(s))  Culture, blood (routine x 2)     Status: None   Collection Time: 03/05/22  7:43 PM   Specimen: BLOOD  Result Value Ref Range Status   Specimen Description BLOOD BLOOD RIGHT ARM  Final   Special Requests   Final    BOTTLES DRAWN AEROBIC AND ANAEROBIC Blood Culture results may not be optimal due to an inadequate volume of blood received in culture bottles   Culture   Final    NO GROWTH 5 DAYS Performed at [facility], [address], [place], [place] [address]    Report Status 03/10/2022 FINAL  Final  Surgical PCR screen     Status: None   Collection Time: 03/06/22 11:31 PM   Specimen: Nasal Mucosa; Nasal Swab  Result Value Ref Range Status   MRSA, PCR NEGATIVE NEGATIVE Final   Staphylococcus aureus NEGATIVE NEGATIVE Final    Comment: (NOTE) The Xpert SA Assay (FDA approved for NASAL specimens in patients 56 years of age and older), is one component of a comprehensive surveillance program. It is not intended to diagnose infection nor to guide or monitor treatment. Performed at [facility], [address] [place], [place] [address]   Aerobic/Anaerobic Culture w Gram Stain (surgical/deep wound)     Status: None (Preliminary result)   Collection Time: 03/07/22  9:50 AM   Specimen: Wound  Result Value Ref Range Status   Specimen Description   Final    WOUND Performed at [facility], [address] [place], [place] [address]    Special Requests   Final    right lower extremity wound Performed at [facility], [address] [place], [place] [address]    Gram Stain   Final    FEW GRAM NEGATIVE RODS RARE WBC PRESENT, PREDOMINANTLY PMN    Culture   Final    FEW CITROBACTER KOSERI FEW ENTEROCOCCUS FAECIUM FEW PROTEUS MIRABILIS CULTURE REINCUBATED FOR BETTER GROWTH NO ANAEROBES ISOLATED Performed at [facility], [place] [address], [place], [place] [address]    Report Status PENDING  Incomplete    Lipid Panel No results for input(s): "CHOL", "TRIG", "HDL", "CHOLHDL", "VLDL", "LDLCALC" in the last 72 hours.  Studies/Results: MR BRAIN WO CONTRAST  Result Date: 03/10/2022 CLINICAL DATA:  Neuro deficit with acute stroke suspected EXAM: MRI HEAD WITHOUT CONTRAST TECHNIQUE: Multiplanar, multiecho pulse sequences of the brain and surrounding structures were obtained without intravenous contrast. COMPARISON:  Head CT from earlier today FINDINGS: Brain: Punctate recent infarct in the right corona radiata and anterior frontal lobe, see markings on diffusion images. Small remote right cerebellar infarct. Small remote cortical infarcts scattered along the left frontal and parietal cortex. Rounded area of intense hemosiderin deposition in the right cerebellum measuring 16 mm, primarily low-density on head CT and likely old hemorrhagic lacunar infarct rather than cavernoma. Chronic lacunar infarct at the left external capsule. Generalized cerebral volume loss. No hemorrhage, hydrocephalus, or collection. Vascular: Major flow voids are preserved Skull and upper  cervical spine: No focal marrow lesion. Low marrow signal in the covered cervical spine, homogeneous and usually physiologic. Sinuses/Orbits: No acute finding IMPRESSION: 1. Two small acute or subacute infarcts in the right cerebral white matter. 2. Prior lacunar and left frontoparietal cortex infarcts. 3.  Remote hemorrhagic insult in the right cerebellum Electronically Signed   By: Jorje Guild M.D.   On: 03/10/2022 15:16   EEG adult  Result Date: 03/10/2022 Lora Havens, MD     03/10/2022 12:25 PM Patient Name: Adam Howell MRN: [id number] Epilepsy Attending: Lora Havens Referring Physician/Provider: Kerney Elbe, MD Date: 03/10/2022 Duration: 25.57 mins Patient history: 61yo M with second episode of awake unresponsiveness. Subtle BUE intermittent jerking versus asterixis seen on exam.  EEG to evaluate for seizure. Level of alertness: lethargic AEDs during EEG study: None Technical aspects: This EEG study was done with scalp electrodes positioned according to the 10-20 International system of electrode placement. Electrical activity was reviewed with band pass filter of 1-70Hz , sensitivity of 7 uV/mm, display speed of 51mm/sec with a 60Hz  notched filter applied as appropriate. EEG data were recorded continuously and digitally stored.  Video monitoring was available and reviewed as appropriate. Description: EEG showed continuous generalized 3 to 5 Hz theta-delta slowing. Hyperventilation and photic stimulation were not performed.   ABNORMALITY - Continuous slow, generalized IMPRESSION: This study is suggestive of severe diffuse encephalopathy, nonspecific etiology. No seizures or epileptiform discharges were seen throughout the recording. Priyanka [name]   CT HEAD CODE STROKE WO CONTRAST`  Result Date: 03/10/2022 CLINICAL DATA:  Code stroke. Neuro deficit, acute, stroke suspected. EXAM: CT HEAD WITHOUT CONTRAST TECHNIQUE: Contiguous axial images were obtained from the base of the skull through the vertex without intravenous contrast. RADIATION DOSE REDUCTION: This exam was performed according to the departmental dose-optimization program which includes automated exposure control, adjustment of the mA and/or kV according to patient size and/or use of iterative reconstruction technique.  COMPARISON:  No pertinent prior exams available for comparison. FINDINGS: Brain: Mild generalized cerebral atrophy. Small age-indeterminate cortically-based infarcts within the posterior left frontal lobe and left parietal lobe (series 3, images 26 and 25). Small chronic cortically-based infarct within the left occipital lobe. Age-indeterminate lacunar infarct within the left corona radiata (series 3, image 19). 10 mm age-indeterminate infarct within the right cerebellar hemisphere. There is no acute intracranial hemorrhage. No extra-axial fluid collection. No evidence of an intracranial mass. No midline shift. Vascular: No hyperdense vessel. Atherosclerotic calcifications. Skull: No fracture or aggressive osseous lesion. Sinuses/Orbits: No mass or acute finding within the imaged orbits. Trace mucosal thickening scattered within the bilateral ethmoid air cells. ASPECTS ([place] Stroke Program Early CT Score) - Ganglionic level infarction (caudate, lentiform nuclei, internal capsule, insula, M1-M3 cortex): 7 - Supraganglionic infarction (M4-M6 cortex): 1 Total score (0-10 with 10 being normal): 9 (when discounting an age-indeterminate cortically based infarct within the posterior left frontal lobe). These results were communicated to Dr. Cheral Marker at 8:49 amon 12/26/2023by text page via the [facility] messaging system. IMPRESSION: 1. Age-indeterminate infarcts within the cortical/subcortical left frontal and left parietal lobes, left corona radiata and right cerebellar hemisphere as described. ASPECTS is 8. 2. Small chronic cortically-based infarct within the left occipital lobe. 3. Mild cerebral atrophy. Electronically Signed   By: Kellie Simmering D.O.   On: 03/10/2022 08:50    Medications: Scheduled:  (feeding supplement) PROSource Plus  30 mL Oral BID BM   amiodarone  200 mg Oral BID   apixaban  2.5 mg Oral  BID   bumetanide  1 mg Oral BID   Chlorhexidine Gluconate Cloth  6 each Topical Q0600   morphine  15 mg Oral  Q12H   multivitamin  1 tablet Oral QHS   nutrition supplement (JUVEN)  1 packet Oral BID BM   rosuvastatin  40 mg Oral Daily   sertraline  50 mg Oral Daily   Continuous:  anticoagulant sodium citrate     dextrose     piperacillin-tazobactam (ZOSYN)  IV 2.25 g (03/11/22 0420)    Assessment: 61 y.o. male with ESRD on HD, CHF, DM, HLD, anemia of chronic disease, atrial fibrillation on Eliquis, chronic RLE ulcer and HTN, who was admitted 12/22 for RLE osteomyelitis, now s/p wound debridement (currently on wound-vac and ABX). Post-operatively on 12/23 he experienced AMS described as an awake but poorly responsive state, which then spontaneously resolved. This morning, after waking up, he was again noted to be poorly responsive. There was question of flaccidity of one of his arms. LKN was 1500 yesterday, when he was alert and oriented x 4 with clear speech. By 2200 he had declined, only being oriented x 2 at that time. No focal weakness noted in nursing assessments.     - Neurological exam today is worsened from yesterday, with findings most consistent with an acute encephalopathy.  - DDx for etiology includes medication-related, metabolic and infectious. Factors predisposing to an encephalopathy include his known infection as well as ESRD.  - Given worsening exam as well as continued intermittent low-amplitude myoclonic-like twitching of his upper extremities and facial muscles, meningitis/encephalitis is now on the DDx. Consider possible dissemination of his recently debrided wound infection/myositis as a potential source.  - Severe/worsening metabolic encephalopathy in the setting of ESRD is also relatively high on the DDx.  - The myoclonic-like twitching is felt more likely to be due to a severe encephalopathy than underlying seizures as EEG yesterday was negative, but will repeat EEG today - Severe recurrent hypoglycemia is also possible. He is being placed on IV D10.  - Although he was hypotensive  at the time of initial assessment yesterday, and severe hypotension can present with AMS, BP today although soft is not felt to be low enough to account for his presentation.    - Neurontin could result in erratic asterixis-like limb movements in the setting of poor clearance due to ESRD; however, Neurontin at the dose he was on at presentation would not be expected to produce the pronounced encephalopathy seen on exam.  - MRI brain: Official read classifies two faint punctate DWI hyperintensities as acute/subacute infarcts. On personal review of the images by Neurology, the punctate signal changes do not fit official criteria for cytotoxic edema due to stroke and are felt most likely to be incidental. Also seen are the following chronic findings: Prior lacunar and left frontoparietal cortex infarcts; remote hemorrhagic insult in the right cerebellum. - EEG (Tuesday):  Continuous generalized slowing. This study is suggestive of severe diffuse encephalopathy, nonspecific to etiology. No seizures or epileptiform discharges were seen throughout the recording.      Recommendations: - Currently on Zosyn for his wound infection/myositis post-debridement - Continue to hold Neurontin - Consulting Pharmacy for assistance with meningitis-dose ABX in this dialysis patient - LP would be useful if possible to obtain today, however, would need to hold Eliquis for 72 hours prior to LP and bridge with heparin given his impaired renal function. Waiting 72 hours while on ABX would significantly lower the sensitivity of the LP  and at that time point, risks of the procedure would therefore most likely outweigh potential benefits. Safest option given the above is to empirically treat with a full course of meningitis-dose ABX.  - ID consult  - Repeat EEG has been ordered - Loading with Keppra 2000 mg IV followed by 750 mg IV BID  - Continue to normalize BP to at least 120/80 - Continue tizanidine, as abrupt withdrawal  from this muscle relaxer can cause delirium, anxiety, tremor and hypertension - NG tube    Addendum: Case further discussed with ID. Per Dr. Delaine Lame, the patient most likely has uremic encephalopathy compounded by opioids, gabapentin and CO2 narcosis, given that his last complete dialysis session was 5 days ago on 12/22. Received 160 mcg of fentanyl, 1 mg Dilaudid and 30mg  morphine after surgery on 12/23. Subsequent attempt at dialysis 12/24 was aborted due to hypotension. He continued to get morphine 30 mg qd on 12/24 and 12/25.No dialysis done on 12/25 or 12/26. Consensus is that his AMS is not due to meningitis or encephalitis. Meningitis-dose antibiotic regimen has been discontinued.   Addendum: Repeat EEG: Continuous slow, generalized. This study is suggestive of moderate to severe diffuse encephalopathy, nonspecific etiology. No seizures or epileptiform discharges were seen throughout the recording.  50 minutes spent in the neurological evaluation and management of this medically complex patient. Greater than 50% of time was spent in coordination of care.    LOS: 6 days   @Electronically  signed: Dr. Kerney Elbe 03/11/2022  9:07 AM

## 2022-03-11 NOTE — Procedures (Signed)
Patient Name: Adam Howell  MRN: [id number]  Epilepsy Attending: Lora Havens  Referring Physician/Provider: Kerney Elbe, MD  Date: 03/10/2022 Duration: 30.50 mins  Patient history: 61 year old male with altered mental status.  EEG to evaluate for seizure.  Level of alertness: lethargic   AEDs during EEG study: LEV  Technical aspects: This EEG study was done with scalp electrodes positioned according to the 10-20 International system of electrode placement. Electrical activity was reviewed with band pass filter of 1-70Hz , sensitivity of 7 uV/mm, display speed of 58mm/sec with a 60Hz  notched filter applied as appropriate. EEG data were recorded continuously and digitally stored.  Video monitoring was available and reviewed as appropriate.  Description: EEG showed continuous generalized 3 to 6 Hz theta-delta slowing. Hyperventilation and photic stimulation were not performed.     ABNORMALITY - Continuous slow, generalized  IMPRESSION: This study is suggestive of moderate to severe diffuse encephalopathy, nonspecific etiology. No seizures or epileptiform discharges were seen throughout the recording.   Frederika Hukill [name]

## 2022-03-11 NOTE — Progress Notes (Signed)
[facility], [place] 03/11/22  Subjective:   LOS: 6  Patient known to our practice from previous admissions.  Patient admitted for worsening right lower extremity ulcer.  Currently getting IV broad-spectrum antibiotics.   Patient underwent irrigation and debridement of right lower extremity and placement of wound VAC.  Patient seen resting in bed, eyes closed Unresponsive to name No lower extremity edema Wound VAC in place Scheduled for dialysis today  Objective:  Vital signs in last 24 hours:  Temp:  [97.6 F (36.4 C)-99 F (37.2 C)] 99 F (37.2 C) (12/27 1035) Pulse Rate:  [71-90] 81 (12/27 1330) Resp:  [11-20] 13 (12/27 1330) BP: (88-135)/(39-117) 103/57 (12/27 1330) SpO2:  [95 %-100 %] 99 % (12/27 1330) FiO2 (%):  [21 %] 21 % (12/27 0753)  Weight change:  Filed Weights   03/05/22 1312 03/08/22 1104 03/08/22 1214  Weight: 115.7 kg 113 kg 114 kg    Intake/Output:    Intake/Output Summary (Last 24 hours) at 03/11/2022 1355 Last data filed at 03/11/2022 0141 Gross per 24 hour  Intake 0 ml  Output 0 ml  Net 0 ml      Physical Exam: General: Chronically ill-appearing, laying in the bed  HEENT Moist oral mucous membranes  Pulm/lungs Normal breathing effort  CVS/Heart No rub  Abdomen:  Soft, nontender, nondistended  Extremities: Chronic right lower extremity wound with wound VAC, trace edema  Neurologic: Somnolent  Skin: No acute rashes  Access: AV fistula       Basic Metabolic Panel:  Recent Labs  Lab 03/05/22 1323 03/07/22 0736 03/08/22 0915 03/10/22 1706  NA 133* 134* 135 133*  K 3.2* 4.4 4.1 5.2*  CL 89* 94* 95* 93*  CO2 30 30 26 25   GLUCOSE 102* 108* 103* 78  BUN 26* 24* 35* 53*  CREATININE 7.14* 6.60* 8.74* 12.03*  CALCIUM 8.8* 8.2* 8.4* 8.6*  PHOS  --   --  6.1*  --       CBC: Recent Labs  Lab 03/05/22 1323 03/07/22 0736 03/08/22 0947 03/10/22 1706  WBC 9.1 11.5* 11.8* 7.9  NEUTROABS 7.0  --   --    --   HGB 12.9* 11.4* 10.1* 10.0*  HCT 40.5 35.3* 33.0* 31.5*  MCV 87.5 87.4 88.9 88.5  PLT 350 344 331 353       Lab Results  Component Value Date   HEPBSAG NON REACTIVE 03/06/2022      Microbiology:  Recent Results (from the past 240 hour(s))  Culture, blood (routine x 2)     Status: None   Collection Time: 03/05/22  7:43 PM   Specimen: BLOOD  Result Value Ref Range Status   Specimen Description BLOOD BLOOD RIGHT ARM  Final   Special Requests   Final    BOTTLES DRAWN AEROBIC AND ANAEROBIC Blood Culture results may not be optimal due to an inadequate volume of blood received in culture bottles   Culture   Final    NO GROWTH 5 DAYS Performed at [facility], [address], [place], [place] [address]    Report Status 03/10/2022 FINAL  Final  Surgical PCR screen     Status: None   Collection Time: 03/06/22 11:31 PM   Specimen: Nasal Mucosa; Nasal Swab  Result Value Ref Range Status   MRSA, PCR NEGATIVE NEGATIVE Final   Staphylococcus aureus NEGATIVE NEGATIVE Final    Comment: (NOTE) The Xpert SA Assay (FDA approved for NASAL specimens in patients 22 years of  age and older), is one component of a comprehensive surveillance program. It is not intended to diagnose infection nor to guide or monitor treatment. Performed at [facility], [address] [place], [place] [address]   Aerobic/Anaerobic Culture w Gram Stain (surgical/deep wound)     Status: None (Preliminary result)   Collection Time: 03/07/22  9:50 AM   Specimen: Wound  Result Value Ref Range Status   Specimen Description   Final    WOUND Performed at [facility], [address], [place], [place] [address]    Special Requests   Final    right lower extremity wound Performed at [facility], [address] [place], [place] [address]    Gram Stain   Final    FEW GRAM NEGATIVE RODS RARE WBC PRESENT, PREDOMINANTLY PMN    Culture   Final    FEW CITROBACTER  KOSERI FEW ENTEROCOCCUS FAECIUM FEW PROTEUS MIRABILIS SUSCEPTIBILITIES TO FOLLOW NO ANAEROBES ISOLATED Performed at [facility], [place] [address] [place], [place] [address]    Report Status PENDING  Incomplete   Organism ID, Bacteria PROTEUS MIRABILIS  Final      Susceptibility   Proteus mirabilis - MIC*    AMPICILLIN <=2 SENSITIVE Sensitive     CEFAZOLIN 8 SENSITIVE Sensitive     CEFEPIME 0.25 SENSITIVE Sensitive     CEFTAZIDIME <=1 SENSITIVE Sensitive     CEFTRIAXONE 0.5 SENSITIVE Sensitive     CIPROFLOXACIN <=0.25 SENSITIVE Sensitive     GENTAMICIN <=1 SENSITIVE Sensitive     IMIPENEM 2 SENSITIVE Sensitive     TRIMETH/SULFA <=20 SENSITIVE Sensitive     AMPICILLIN/SULBACTAM <=2 SENSITIVE Sensitive     PIP/TAZO <=4 SENSITIVE Sensitive     * FEW PROTEUS MIRABILIS    Coagulation Studies: No results for input(s): "LABPROT", "INR" in the last 72 hours.   Urinalysis: No results for input(s): "COLORURINE", "LABSPEC", "PHURINE", "GLUCOSEU", "HGBUR", "BILIRUBINUR", "KETONESUR", "PROTEINUR", "UROBILINOGEN", "NITRITE", "LEUKOCYTESUR" in the last 72 hours.  Invalid input(s): "APPERANCEUR"    Imaging: MR BRAIN WO CONTRAST  Result Date: 03/10/2022 CLINICAL DATA:  Neuro deficit with acute stroke suspected EXAM: MRI HEAD WITHOUT CONTRAST TECHNIQUE: Multiplanar, multiecho pulse sequences of the brain and surrounding structures were obtained without intravenous contrast. COMPARISON:  Head CT from earlier today FINDINGS: Brain: Punctate recent infarct in the right corona radiata and anterior frontal lobe, see markings on diffusion images. Small remote right cerebellar infarct. Small remote cortical infarcts scattered along the left frontal and parietal cortex. Rounded area of intense hemosiderin deposition in the right cerebellum measuring 16 mm, primarily low-density on head CT and likely old hemorrhagic lacunar infarct rather than cavernoma. Chronic lacunar infarct at the left external  capsule. Generalized cerebral volume loss. No hemorrhage, hydrocephalus, or collection. Vascular: Major flow voids are preserved Skull and upper cervical spine: No focal marrow lesion. Low marrow signal in the covered cervical spine, homogeneous and usually physiologic. Sinuses/Orbits: No acute finding IMPRESSION: 1. Two small acute or subacute infarcts in the right cerebral white matter. 2. Prior lacunar and left frontoparietal cortex infarcts. 3. Remote hemorrhagic insult in the right cerebellum Electronically Signed   By: Jorje Guild M.D.   On: 03/10/2022 15:16   EEG adult  Result Date: 03/10/2022 Lora Havens, MD     03/10/2022 12:25 PM Patient Name: Cardin Nitschke MRN: [id number] Epilepsy Attending: Lora Havens Referring Physician/Provider: Kerney Elbe, MD Date: 03/10/2022 Duration: 25.57 mins Patient history: 61yo M with second episode  of awake unresponsiveness. Subtle BUE intermittent jerking versus asterixis seen on exam.  EEG to evaluate for seizure. Level of alertness: lethargic AEDs during EEG study: None Technical aspects: This EEG study was done with scalp electrodes positioned according to the 10-20 International system of electrode placement. Electrical activity was reviewed with band pass filter of 1-70Hz , sensitivity of 7 uV/mm, display speed of 77mm/sec with a 60Hz  notched filter applied as appropriate. EEG data were recorded continuously and digitally stored.  Video monitoring was available and reviewed as appropriate. Description: EEG showed continuous generalized 3 to 5 Hz theta-delta slowing. Hyperventilation and photic stimulation were not performed.   ABNORMALITY - Continuous slow, generalized IMPRESSION: This study is suggestive of severe diffuse encephalopathy, nonspecific etiology. No seizures or epileptiform discharges were seen throughout the recording. Priyanka [name]   CT HEAD CODE STROKE WO CONTRAST`  Result Date: 03/10/2022 CLINICAL DATA:  Code stroke.  Neuro deficit, acute, stroke suspected. EXAM: CT HEAD WITHOUT CONTRAST TECHNIQUE: Contiguous axial images were obtained from the base of the skull through the vertex without intravenous contrast. RADIATION DOSE REDUCTION: This exam was performed according to the departmental dose-optimization program which includes automated exposure control, adjustment of the mA and/or kV according to patient size and/or use of iterative reconstruction technique. COMPARISON:  No pertinent prior exams available for comparison. FINDINGS: Brain: Mild generalized cerebral atrophy. Small age-indeterminate cortically-based infarcts within the posterior left frontal lobe and left parietal lobe (series 3, images 26 and 25). Small chronic cortically-based infarct within the left occipital lobe. Age-indeterminate lacunar infarct within the left corona radiata (series 3, image 19). 10 mm age-indeterminate infarct within the right cerebellar hemisphere. There is no acute intracranial hemorrhage. No extra-axial fluid collection. No evidence of an intracranial mass. No midline shift. Vascular: No hyperdense vessel. Atherosclerotic calcifications. Skull: No fracture or aggressive osseous lesion. Sinuses/Orbits: No mass or acute finding within the imaged orbits. Trace mucosal thickening scattered within the bilateral ethmoid air cells. ASPECTS (Wolfe Stroke Program Early CT Score) - Ganglionic level infarction (caudate, lentiform nuclei, internal capsule, insula, M1-M3 cortex): 7 - Supraganglionic infarction (M4-M6 cortex): 1 Total score (0-10 with 10 being normal): 9 (when discounting an age-indeterminate cortically based infarct within the posterior left frontal lobe). These results were communicated to Dr. Cheral Marker at 8:49 amon 12/26/2023by text page via the [facility] messaging system. IMPRESSION: 1. Age-indeterminate infarcts within the cortical/subcortical left frontal and left parietal lobes, left corona radiata and right cerebellar hemisphere  as described. ASPECTS is 8. 2. Small chronic cortically-based infarct within the left occipital lobe. 3. Mild cerebral atrophy. Electronically Signed   By: Kellie Simmering D.O.   On: 03/10/2022 08:50     Medications:    anticoagulant sodium citrate     dextrose     levETIRAcetam     levETIRAcetam     Followed by   levETIRAcetam     piperacillin-tazobactam (ZOSYN)  IV 2.25 g (03/11/22 0420)    (feeding supplement) PROSource Plus  30 mL Oral BID BM   amiodarone  200 mg Oral BID   apixaban  2.5 mg Oral BID   bumetanide  1 mg Oral BID   Chlorhexidine Gluconate Cloth  6 each Topical Q0600   multivitamin  1 tablet Oral QHS   nutrition supplement (JUVEN)  1 packet Oral BID BM   rosuvastatin  40 mg Oral Daily   sertraline  50 mg Oral Daily   acetaminophen **OR** acetaminophen, alteplase, anticoagulant sodium citrate, dextrose, heparin, lidocaine (PF), lidocaine-prilocaine, ondansetron **OR**  ondansetron (ZOFRAN) IV, oxyCODONE-acetaminophen, pentafluoroprop-tetrafluoroeth, tiZANidine  Assessment/ Plan:  61 y.o. male with diabetes with complications, ESRD, atrial fibrillation requiring Eliquis, amiodarone, congestive heart failure, secondary hyperparathyroidism, chronic nonhealing right lower extremity wound was admitted on 03/05/2022 for  Principal Problem:   Wound infection of chronic right lower extremity ulcer Active Problems:   Anemia in chronic kidney disease   ESRD on dialysis ([place])   Obesity (BMI 30-39.9)   Type 2 diabetes mellitus with ESRD (end-stage renal disease) (HCC)   Chronic atrial fibrillation with RVR (HCC)   Hyperlipidemia associated with type 2 diabetes mellitus (HCC)   Chronic systolic CHF (congestive heart failure) (HCC)   Hypokalemia   Hyponatremia   Acute metabolic encephalopathy   Hypotension   Uncontrolled type 2 diabetes mellitus with hypoglycemia, with long-term current use of insulin ([place])  Unspecified open wound, right lower leg, initial encounter  [S81.801A] Necrotizing fasciitis of lower leg ([place]) [M72.6]  #. ESRD [facility]/MWF/left aVF   Attempted dialysis yesterday however aborted due to hypotension.  Patient receiving scheduled dialysis today, UF 0.  Next treatment scheduled for Friday.  #. Anemia of CKD  Lab Results  Component Value Date   HGB 10.0 (L) 03/10/2022  Hemoglobin at goal Low dose EPO with HD for hemoglobin less than 10  #. Secondary hyperparathyroidism of renal origin N 25.81   No results found for: "PTH" Lab Results  Component Value Date   PHOS 6.1 (H) 03/08/2022   Will monitor bone minerals during this admission.   #.  Nonhealing right lower extremity ulcer with wound infection -Broad-spectrum antibiotics -Irrigation and debridement, placement of wound VAC on 03/07/2022.  # Acute metabolic encephalopathy.  MRI head suggestive of subacute infarct however neurology has disputed this finding.  EEG performed, suggestive of metabolic encephalopathy.  Patient has received IV pain medications and gabapentin this past weekend without receiving a complete dialysis treatment. May require Narcan. Will dialyze today and may consider additional treatment tomorrow.    LOS: [place] 12/27/20231:55 PM  [place], [place]

## 2022-03-12 ENCOUNTER — Encounter: Payer: Self-pay | Admitting: Internal Medicine

## 2022-03-12 DIAGNOSIS — G9341 Metabolic encephalopathy: Secondary | ICD-10-CM | POA: Diagnosis not present

## 2022-03-12 DIAGNOSIS — E11649 Type 2 diabetes mellitus with hypoglycemia without coma: Secondary | ICD-10-CM | POA: Diagnosis not present

## 2022-03-12 DIAGNOSIS — N186 End stage renal disease: Secondary | ICD-10-CM | POA: Diagnosis not present

## 2022-03-12 DIAGNOSIS — Z794 Long term (current) use of insulin: Secondary | ICD-10-CM

## 2022-03-12 DIAGNOSIS — L089 Local infection of the skin and subcutaneous tissue, unspecified: Secondary | ICD-10-CM | POA: Diagnosis not present

## 2022-03-12 DIAGNOSIS — T148XXA Other injury of unspecified body region, initial encounter: Secondary | ICD-10-CM | POA: Diagnosis not present

## 2022-03-12 LAB — GLUCOSE, CAPILLARY
Glucose-Capillary: 88 mg/dL (ref 70–99)
Glucose-Capillary: 79 mg/dL (ref 70–99)
Glucose-Capillary: 94 mg/dL (ref 70–99)
Glucose-Capillary: 91 mg/dL (ref 70–99)

## 2022-03-12 LAB — AEROBIC/ANAEROBIC CULTURE W GRAM STAIN (SURGICAL/DEEP WOUND)

## 2022-03-12 LAB — CBC
HCT: 32.2 % — ABNORMAL LOW (ref 39.0–52.0)
Hemoglobin: 10.3 g/dL — ABNORMAL LOW (ref 13.0–17.0)
MCH: 28.2 pg (ref 26.0–34.0)
MCHC: 32 g/dL (ref 30.0–36.0)
MCV: 88.2 fL (ref 80.0–100.0)
Platelets: 341 10*3/uL (ref 150–400)
RBC: 3.65 MIL/uL — ABNORMAL LOW (ref 4.22–5.81)
RDW: 16.6 % — ABNORMAL HIGH (ref 11.5–15.5)
WBC: 7.9 10*3/uL (ref 4.0–10.5)
nRBC: 0 % (ref 0.0–0.2)

## 2022-03-12 LAB — COMPREHENSIVE METABOLIC PANEL
ALT: 12 U/L (ref 0–44)
AST: 30 U/L (ref 15–41)
Albumin: 2.5 g/dL — ABNORMAL LOW (ref 3.5–5.0)
Alkaline Phosphatase: 71 U/L (ref 38–126)
Anion gap: 13 (ref 5–15)
BUN: 37 mg/dL — ABNORMAL HIGH (ref 6–20)
CO2: 27 mmol/L (ref 22–32)
Calcium: 8.4 mg/dL — ABNORMAL LOW (ref 8.9–10.3)
Chloride: 96 mmol/L — ABNORMAL LOW (ref 98–111)
Creatinine, Ser: 8.59 mg/dL — ABNORMAL HIGH (ref 0.61–1.24)
GFR, Estimated: 7 mL/min — ABNORMAL LOW (ref >60)
Glucose, Bld: 88 mg/dL (ref 70–99)
Potassium: 4.6 mmol/L (ref 3.5–5.1)
Sodium: 136 mmol/L (ref 135–145)
Total Bilirubin: 1.3 mg/dL — ABNORMAL HIGH (ref 0.3–1.2)
Total Protein: 7.1 g/dL (ref 6.5–8.1)

## 2022-03-12 LAB — TYPE AND SCREEN
ABO/RH(D): A POS
Antibody Screen: NEGATIVE

## 2022-03-12 MED ORDER — SODIUM CHLORIDE 0.9 % IV SOLN
500.0000 mg | INTRAVENOUS | Status: DC
  Administered 2022-03-12 – 2022-03-14 (×3): 500 mg via INTRAVENOUS
  Filled 2022-03-12 (×3): qty 10

## 2022-03-12 MED ORDER — HYDROMORPHONE HCL 1 MG/ML IJ SOLN
0.5000 mg | INTRAMUSCULAR | Status: DC | PRN
  Administered 2022-03-12 – 2022-03-15 (×11): 0.5 mg via INTRAVENOUS
  Filled 2022-03-12 (×11): qty 0.5

## 2022-03-12 MED ORDER — SODIUM CHLORIDE 0.9 % IV SOLN: 375.0000 mg | INTRAVENOUS | Status: DC

## 2022-03-12 MED ORDER — LINEZOLID 600 MG/300ML IV SOLN
600.0000 mg | Freq: Two times a day (BID) | INTRAVENOUS | Status: DC
  Administered 2022-03-12 – 2022-03-14 (×4): 600 mg via INTRAVENOUS
  Filled 2022-03-12 (×6): qty 300

## 2022-03-12 MED ORDER — SODIUM CHLORIDE 0.9 % IV SOLN: 750.0000 mg | INTRAVENOUS | Status: DC

## 2022-03-12 NOTE — TOC Progression Note (Signed)
Transition of Care [facility]) - Progression Note    Patient Details  Name: Adam Howell MRN: [id number] Date of Birth: 28-Apr-1960  [facility] CM/SW Contact  Beverly Sessions, RN Phone Number: 03/12/2022, 5:16 PM  Clinical Narrative:    Per MD current recommendation if for amputation however patient is declining.  Daughter to discuss with patient and determine if patient will be agreeable    Expected Discharge Plan: [place] Barriers to Discharge: Continued Medical Work up  Expected Discharge Plan and Services                                               Social Determinants of Health (SDOH) Interventions [place]: Food Insecurity Present (03/06/2022)  Housing: Low Risk  (03/06/2022)  Transportation Needs: No Transportation Needs (03/06/2022)  Utilities: Not At Risk (03/06/2022)  Alcohol Screen: Low Risk  (11/25/2021)  Depression (PHQ2-9): Low Risk  (11/25/2021)  Financial Resource Strain: Low Risk  (11/25/2021)  Physical Activity: Insufficiently Active (11/25/2021)  Social Connections: Moderately Isolated (11/25/2021)  Stress: No Stress Concern Present (11/25/2021)  Tobacco Use: Low Risk  (03/10/2022)    Readmission Risk Interventions     No data to display

## 2022-03-12 NOTE — Progress Notes (Signed)
[facility]  Daily Progress Note   Subjective  - POD # 5 S/P Irrigation and debridement of RIGHT lower extremity Placement of wound VAC  Adam Howell is a 61 y.o. male with a history of CKD on dialysis MWF, anemia of chronic disease, type 2 diabetes, CHF, A-fib, and a chronic RLE ulcer, presents to the ED for evaluation.  According to the patient he had a debridement a little over 1 month ago to the right lower extremity chronic ulceration. Patient reports that his home health nurse who comes to the house twice weekly for wound care, was concerned because of the significant progression of the patient's wound since last week.  She last changed her dressing today, and noted since Tuesday, the wound appears to show exposed bone.  Patient denies any fevers, chills, sweats.  Would endorse that the wound has been weeping between dressing changes.    Adam Howell is a 61 y.o. male with a history of CKD on dialysis MWF, anemia of chronic disease, type 2 diabetes, CHF, A-fib, and a chronic RLE ulcer, presents to the ED for evaluation.  According to the patient he had a debridement a little over 1 month ago to the right lower extremity chronic ulceration. Patient reports that his home health nurse who comes to the house twice weekly for wound care, was concerned because of the significant progression of the patient's wound since last week.  She last changed her dressing at her visit, and noted since Tuesday, the wound appears to show exposed bone.  Patient denies any fevers, chills, sweats.  Would endorse that the wound has been weeping between dressing changes.    On 03/07/22 the patient was taken to the operating room for a wound debridement and wound vac placement. Today the wound vac was removed and wet to dry dressings were placed at the bedside. There did not appear to be any changes in the wound post operatively. Dr Roosevelt Locks was at the bedside immediately after removing the  wound vac. Pictures were taken and loaded into the patients media file. Dr Roosevelt Locks stated the leg needs to be amputated or the patient will become critically ill with the possiblity of loss of life. Vascular surgery at this time agrees with this plan. Patient is on the operating schedule for tomorrow morning (03/13/2022) for an above the knee amputation.   At this time the patient is not competent to understand the severity of his wound infection or that the only way to preserve life is to remove his infected leg. Dr Roosevelt Locks asked the patient  to tell us where he was and the patient was unable to answer. I then called the patients daughter [name]) who has health care power of attorney. I was given verbal consent to amputate the patients right lower extremity above his knee as a life saving measure. The patient daughter said she will be here at [place] tomorrow morning to physically sign the consent for surgery.    Objective Vitals:   03/12/22 1030 03/12/22 1104 03/12/22 1109 03/12/22 1116  BP: 111/65 (!) 109/55  (!) 106/57  Pulse: 81 81    Resp: (!) 8     Temp:  98.7 F (37.1 C)    TempSrc:  Axillary    SpO2: 95% 94%  95%  Weight:   111.9 kg   Height:        Intake/Output Summary (Last 24 hours) at 03/12/2022 1516 Last data filed at 03/12/2022 1104 Gross  per 24 hour  Intake 518.18 ml  Output 150 ml  Net 368.18 ml    PULM  CTAB CV  RRR VASC  Open right lower extremity wound with tendon and tibia bone exposure. No palpable pulses to the right lower extremity.   Laboratory CBC    Component Value Date/Time   WBC 7.9 03/12/2022 0540   HGB 10.3 (L) 03/12/2022 0540   HGB 14.4 02/03/2022 1441   HCT 32.2 (L) 03/12/2022 0540   HCT 44.0 02/03/2022 1441   PLT 341 03/12/2022 0540   PLT 200 02/03/2022 1441    BMET    Component Value Date/Time   NA 136 03/12/2022 0540   NA 136 02/03/2022 1441   K 4.6 03/12/2022 0540   CL 96 (L) 03/12/2022 0540   CO2 27 03/12/2022 0540   GLUCOSE 88  03/12/2022 0540   BUN 37 (H) 03/12/2022 0540   BUN 35 (H) 02/03/2022 1441   CREATININE 8.59 (H) 03/12/2022 0540   CALCIUM 8.4 (L) 03/12/2022 0540   GFRNONAA 7 (L) 03/12/2022 0540    Assessment/Planning: POD #5 s/p Irrigation and debridement of RIGHT lower extremity With Placement of wound VAC  Patient is noted to have nonviable tissue and infection of the right lower extremity with tendon exposure and tibial bone exposure.  She has been placed on the operating schedule for tomorrow morning for an above-the-knee amputation of the right lower extremity. I discussed in detail with the patient's daughter [name] who is healthcare power of attorney, the procedure, benefits, risks, and complications.  Daughter was made aware that without the operation the patient's septic infection would take his life.  He verbalized her understanding over the phone.  At this time I did not have witness to consent for the surgery but daughter [name] states she will be here at 6 AM on 03/13/2022 to sign consent for surgery.  I answered all of her questions.  Verbalizes her understanding of the critical situation.  Drema Pry  03/12/2022, 3:16 PM

## 2022-03-12 NOTE — Evaluation (Addendum)
Clinical/Bedside Swallow Evaluation Patient Details  Name: Adam Howell MRN: [id number] Date of Birth: 1960-08-17  Today's Date: 03/12/2022 Time: SLP Start Time (ACUTE ONLY): 38 SLP Stop Time (ACUTE ONLY): 1310 SLP Time Calculation (min) (ACUTE ONLY): 10 min  Past Medical History:  Past Medical History:  Diagnosis Date   Chronic kidney disease    Congestive heart failure ([place])    Diabetes mellitus without complication ([place])    Hyperlipidemia    Hypertension    Past Surgical History:  Past Surgical History:  Procedure Laterality Date   APPLICATION OF WOUND VAC Right 03/07/2022   Procedure: APPLICATION OF WOUND VAC;  Surgeon: Evaristo Bury, MD;  Location: [facility] ORS;  Service: Vascular;  Laterality: Right;  [place] [id number]   WOUND DEBRIDEMENT Left    WOUND DEBRIDEMENT Right 01/16/2022   Procedure: DEBRIDEMENT WOUND;  Surgeon: Katha Cabal, MD;  Location: [facility] ORS;  Service: Vascular;  Laterality: Right;  Wound vac placement   WOUND DEBRIDEMENT Right 03/07/2022   Procedure: DEBRIDEMENT WOUND RIGHT LOWER EXTREMITY;  Surgeon: Evaristo Bury, MD;  Location: [facility] ORS;  Service: Vascular;  Laterality: Right;   HPI:  Per H&P "Adam Howell is a 61 y.o. male with medical history significant for diabetes mellitus with complications of end-stage renal disease on hemodialysis (M/W/F), history of A-fib on Eliquis and amiodarone, HFpEF (EF 35 to 40% 2021), secondary hyperparathyroidism with chronic nonhealing right lower extremity wound for which he was hospitalized for debridement and IV antibiotics from 10/31 to 11/6 who was sent to the ED by his home health nurse due to concerns for increased drainage and foul odor from the wound in spite of ongoing dressings.  Patient also reports increased pain of the lower leg.  He denies fever and chills.  Wound culture from back in October 11 Proteus mirabilis and Klebsiella oxytoca treated initially with Rocephin and vancomycin and transition  to Keflex at discharge.  ED course and data review: Vitals within normal limits though he was briefly tachycardic to 115 in the ED.  CBC unremarkable with normal lactic acid.  Mild hypokalemia on CMP.  EKG, personally viewed and interpreted shows A-fib at 79.  MRI right tib-fib suggesting myositis without osteomyelitis as follows:  IMPRESSION:  1. Deep skin wound about the medial aspect of the tibia without  evidence of fluid collection or abscess.  2. Increased signal of the muscles of the anterior compartment with  edema along the tendons suggesting myositis. No evidence of fluid  collection or abscess.  3. Marrow signal is within normal limits without evidence of  osteomyelitis."    Assessment / Plan / Recommendation  Clinical Impression  Pt seen for clinical swallowing evaluation. Pt alert. Notably confused. Verbally perseverative. Unable to be redirected. Agitated at times. Cleared with RN. RN reports holding POs this date due to pt's overall mental status.   Per chart review, low grade temp yesterday. WBC WNL. No recent chest imaging.   Pt unable to participate in oral motor examination given inability to follow commands.   Pt would not allow SLP to complete oral care despite education.   Pt presented with ice chips (via tsp), thin liquids (variety of liquids; via tsp, cup, straw), and pureed (via tsp). Across textures, despite encouragement and redirection, pt demonstrated refusal-type behaviors - pulling head away, pursing lips, saying "no." Oropharyngeal swallow function could not be assessed clinically; however, pt is at increased risk for aspiration/aspiration PNA given AMS, clinical presentation, and multiple medical comorbidities. Given  pt's overall functional status, pt may benefit from Palliative Care Consult.  At present, a safe oral diet cannot be recommended. Recommend NPO with consideration for short term alternate means of nutrition/hydration/medication.   SLP to f/u per POC for  clinical swallowing re-evaluation pending improvements in mental status.   SLP Visit Diagnosis: Dysphagia, unspecified (R13.10)    Aspiration Risk  Risk for inadequate nutrition/hydration;Severe aspiration risk    Diet Recommendation NPO;Alternative means - temporary   Medication Administration: Via alternative means    Other  Recommendations Recommended Consults:  (Palliative Care Consult; RD consult) Oral Care Recommendations: Oral care QID;Staff/trained caregiver to provide oral care    Recommendations for follow up therapy are one component of a multi-disciplinary discharge planning process, led by the attending physician.  Recommendations may be updated based on patient status, additional functional criteria and insurance authorization.  Follow up Recommendations  (TBD)         Functional Status Assessment Patient has had a recent decline in their functional status and demonstrates the ability to make significant improvements in function in a reasonable and predictable amount of time.  Frequency and Duration min 2x/week  2 weeks       Prognosis Prognosis for Safe Diet Advancement: Guarded Barriers to Reach Goals: Cognitive deficits;Severity of deficits;Behavior      Swallow Study   General Date of Onset: 03/05/22 (admission date) HPI: Per H&P "Adam Howell is a 61 y.o. male with medical history significant for diabetes mellitus with complications of end-stage renal disease on hemodialysis (M/W/F), history of A-fib on Eliquis and amiodarone, HFpEF (EF 35 to 40% 2021), secondary hyperparathyroidism with chronic nonhealing right lower extremity wound for which he was hospitalized for debridement and IV antibiotics from 10/31 to 11/6 who was sent to the ED by his home health nurse due to concerns for increased drainage and foul odor from the wound in spite of ongoing dressings.  Patient also reports increased pain of the lower leg.  He denies fever and chills.  Wound culture  from back in October 11 Proteus mirabilis and Klebsiella oxytoca treated initially with Rocephin and vancomycin and transition to Keflex at discharge.  ED course and data review: Vitals within normal limits though he was briefly tachycardic to 115 in the ED.  CBC unremarkable with normal lactic acid.  Mild hypokalemia on CMP.  EKG, personally viewed and interpreted shows A-fib at 80.  MRI right tib-fib suggesting myositis without osteomyelitis as follows:  IMPRESSION:  1. Deep skin wound about the medial aspect of the tibia without  evidence of fluid collection or abscess.  2. Increased signal of the muscles of the anterior compartment with  edema along the tendons suggesting myositis. No evidence of fluid  collection or abscess.  3. Marrow signal is within normal limits without evidence of  osteomyelitis." Type of Study: Bedside Swallow Evaluation Previous Swallow Assessment: unknown Diet Prior to this Study: Regular;Thin liquids Temperature Spikes Noted: Yes Respiratory Status: Room air History of Recent Intubation: No Behavior/Cognition: Alert;Confused;Agitated;Uncooperative;Distractible;Doesn't follow directions Oral Cavity Assessment: Within Functional Limits Oral Care Completed by SLP:  (pt demonstrated refusal-type behaviors - pulling head away, pursing lips, saying "no") Oral Cavity - Dentition: Poor condition;Missing dentition Self-Feeding Abilities: Refused PO Patient Positioning: Upright in bed Baseline Vocal Quality: Normal Volitional Cough: Cognitively unable to elicit Volitional Swallow: Unable to elicit    Oral/Motor/Sensory Function Overall Oral Motor/Sensory Function:  (unable to participate in oral motor examination given inability to follow commands)   Ice Chips  Ice chips: Impaired Presentation: Spoon Oral Phase Impairments:  (pt demonstrated refusal-type behaviors - pulling head away, pursing lips, saying "no") Oral Phase Functional Implications:  (UTA) Pharyngeal Phase  Impairments:  (UTA)   Thin Liquid Thin Liquid: Impaired Presentation: Spoon;Cup;Straw Oral Phase Impairments:  (pt demonstrated refusal-type behaviors - pulling head away, pursing lips, saying "no") Oral Phase Functional Implications:  (UTA) Pharyngeal  Phase Impairments:  (UTA)    Nectar Thick Nectar Thick Liquid: Not tested   Honey Thick Honey Thick Liquid: Not tested   Puree Puree: Impaired Presentation: Spoon (pt demonstrated refusal-type behaviors - pulling head away, pursing lips, saying "no") Oral Phase Impairments:  (pt demonstrated refusal-type behaviors - pulling head away, pursing lips, saying "no") Oral Phase Functional Implications:  (UTA) Pharyngeal Phase Impairments:  (UTA)   Solid     Solid: Not tested     Cherrie Gauze, M.S., [facility] [phone] (ASCOM)  Clearnce Sorrel Robertha Staples 03/12/2022,1:57 PM

## 2022-03-12 NOTE — Progress Notes (Signed)
Pt completed and tolerated tx well  pt ran for 3hrs with no fluid removal.  BVP 72.0l. report given to floor nurse.  No distress noted and no c/o by pt.  Pt on 2l Sharon upon derparture

## 2022-03-12 NOTE — Anesthesia Preprocedure Evaluation (Addendum)
Anesthesia Evaluation  Patient identified by MRN, date of birth, ID band Patient awake    Reviewed: Allergy & Precautions, NPO status , Patient's Chart, lab work & pertinent test results  History of Anesthesia Complications Negative for: history of anesthetic complications  Airway Mallampati: IV   Neck ROM: Full    Dental  (+) Poor Dentition   Pulmonary neg pulmonary ROS   Pulmonary exam normal breath sounds clear to auscultation       Cardiovascular hypertension, + CAD and +CHF (EF 35-40%)  Normal cardiovascular exam+ dysrhythmias (a fib on Eliquis)  Rhythm:Regular Rate:Normal  ECG 03/10/22:  Atrial fibrillation Non-specific intra-ventricular conduction block Lateral infarct , age undetermined T wave abnormality, consider inferior ischemia Abnormal ECG When compared with ECG of 13-Apr-2021 12:54, No significant change was found   Neuro/Psych negative neurological ROS     GI/Hepatic negative GI ROS,,,  Endo/Other  diabetes, Type 2, Insulin Dependent  Obesity   Renal/GU ESRF and DialysisRenal disease     Musculoskeletal   Abdominal   Peds  Hematology  (+) Blood dyscrasia, anemia   Anesthesia Other Findings   Reproductive/Obstetrics                             Anesthesia Physical Anesthesia Plan  ASA: 3  Anesthesia Plan: General   Post-op Pain Management:    Induction: Intravenous  PONV Risk Score and Plan: 2 and Ondansetron, Dexamethasone and Treatment may vary due to age or medical condition  Airway Management Planned: LMA  Additional Equipment:   Intra-op Plan:   Post-operative Plan: Extubation in OR  Informed Consent: I have reviewed the patients History and Physical, chart, labs and discussed the procedure including the risks, benefits and alternatives for the proposed anesthesia with the patient or authorized representative who has indicated his/her understanding  and acceptance.     Dental advisory given  Plan Discussed with: CRNA  Anesthesia Plan Comments: (Patient has been confused while inpatient.  Daughter Annetta Maw consented via phone for risks of anesthesia including but not limited to:  - adverse reactions to medications - damage to eyes, teeth, lips or other oral mucosa - nerve damage due to positioning  - sore throat or hoarseness - damage to heart, brain, nerves, lungs, other parts of body or loss of life  Informed patient's daughter about role of CRNA in peri- and intra-operative care; she voiced understanding.)        Anesthesia Quick Evaluation

## 2022-03-12 NOTE — H&P (View-Only) (Signed)
[facility]  Daily Progress Note   Subjective  - POD # 5 S/P Irrigation and debridement of RIGHT lower extremity Placement of wound VAC  Adam Howell is a 61 y.o. male with a history of CKD on dialysis MWF, anemia of chronic disease, type 2 diabetes, CHF, A-fib, and a chronic RLE ulcer, presents to the ED for evaluation.  According to the patient he had a debridement a little over 1 month ago to the right lower extremity chronic ulceration. Patient reports that his home health nurse who comes to the house twice weekly for wound care, was concerned because of the significant progression of the patient's wound since last week.  She last changed her dressing today, and noted since Tuesday, the wound appears to show exposed bone.  Patient denies any fevers, chills, sweats.  Would endorse that the wound has been weeping between dressing changes.    Adam Howell is a 61 y.o. male with a history of CKD on dialysis MWF, anemia of chronic disease, type 2 diabetes, CHF, A-fib, and a chronic RLE ulcer, presents to the ED for evaluation.  According to the patient he had a debridement a little over 1 month ago to the right lower extremity chronic ulceration. Patient reports that his home health nurse who comes to the house twice weekly for wound care, was concerned because of the significant progression of the patient's wound since last week.  She last changed her dressing at her visit, and noted since Tuesday, the wound appears to show exposed bone.  Patient denies any fevers, chills, sweats.  Would endorse that the wound has been weeping between dressing changes.    On 03/07/22 the patient was taken to the operating room for a wound debridement and wound vac placement. Today the wound vac was removed and wet to dry dressings were placed at the bedside. There did not appear to be any changes in the wound post operatively. Dr Roosevelt Locks was at the bedside immediately after removing the  wound vac. Pictures were taken and loaded into the patients media file. Dr Roosevelt Locks stated the leg needs to be amputated or the patient will become critically ill with the possiblity of loss of life. Vascular surgery at this time agrees with this plan. Patient is on the operating schedule for tomorrow morning (03/13/2022) for an above the knee amputation.   At this time the patient is not competent to understand the severity of his wound infection or that the only way to preserve life is to remove his infected leg. Dr Roosevelt Locks asked the patient  to tell us where he was and the patient was unable to answer. I then called the patients daughter [name]) who has health care power of attorney. I was given verbal consent to amputate the patients right lower extremity above his knee as a life saving measure. The patient daughter said she will be here at [place] tomorrow morning to physically sign the consent for surgery.    Objective Vitals:   03/12/22 1030 03/12/22 1104 03/12/22 1109 03/12/22 1116  BP: 111/65 (!) 109/55  (!) 106/57  Pulse: 81 81    Resp: (!) 8     Temp:  98.7 F (37.1 C)    TempSrc:  Axillary    SpO2: 95% 94%  95%  Weight:   111.9 kg   Height:        Intake/Output Summary (Last 24 hours) at 03/12/2022 1516 Last data filed at 03/12/2022 1104 Gross  per 24 hour  Intake 518.18 ml  Output 150 ml  Net 368.18 ml    PULM  CTAB CV  RRR VASC  Open right lower extremity wound with tendon and tibia bone exposure. No palpable pulses to the right lower extremity.   Laboratory CBC    Component Value Date/Time   WBC 7.9 03/12/2022 0540   HGB 10.3 (L) 03/12/2022 0540   HGB 14.4 02/03/2022 1441   HCT 32.2 (L) 03/12/2022 0540   HCT 44.0 02/03/2022 1441   PLT 341 03/12/2022 0540   PLT 200 02/03/2022 1441    BMET    Component Value Date/Time   NA 136 03/12/2022 0540   NA 136 02/03/2022 1441   K 4.6 03/12/2022 0540   CL 96 (L) 03/12/2022 0540   CO2 27 03/12/2022 0540   GLUCOSE 88  03/12/2022 0540   BUN 37 (H) 03/12/2022 0540   BUN 35 (H) 02/03/2022 1441   CREATININE 8.59 (H) 03/12/2022 0540   CALCIUM 8.4 (L) 03/12/2022 0540   GFRNONAA 7 (L) 03/12/2022 0540    Assessment/Planning: POD #5 s/p Irrigation and debridement of RIGHT lower extremity With Placement of wound VAC  Patient is noted to have nonviable tissue and infection of the right lower extremity with tendon exposure and tibial bone exposure.  She has been placed on the operating schedule for tomorrow morning for an above-the-knee amputation of the right lower extremity. I discussed in detail with the patient's daughter [name] who is healthcare power of attorney, the procedure, benefits, risks, and complications.  Daughter was made aware that without the operation the patient's septic infection would take his life.  He verbalized her understanding over the phone.  At this time I did not have witness to consent for the surgery but daughter [name] states she will be here at 6 AM on 03/13/2022 to sign consent for surgery.  I answered all of her questions.  Verbalizes her understanding of the critical situation.  Drema Pry  03/12/2022, 3:16 PM

## 2022-03-12 NOTE — Progress Notes (Addendum)
Nutrition Follow-up  DOCUMENTATION CODES:   Obesity unspecified  INTERVENTION:   If diet initiated:  Nepro Shake po TID, each supplement provides 425 kcal and 19 grams protein  Rena-vit po daily   Vitamin C 567m po BID  Zinc 2214mpo daily x 30 days  Pt at high refeed risk; recommend monitor potassium, magnesium and phosphorus labs daily until stable  Will check vitamins A, D, E, C, zinc and copper labs if plan is for full aggressive care.   Daily weights   If NGT placed, recommend:  Osmolite 1.5_0 /hr- Initiate at 2518mr and increase by 45m86m q 8 hours until goal rate is reached.  ProSource TF 20- Give 60ml70mly via tube, each supplement provides 80kcal and 20g of protein.   Free water flushes 30ml 46mours to maintain tube patency   Regimen provides 2700kcal/day, 133g/day protein and 1551ml/d71mf free water.   NUTRITION DIAGNOSIS:   Increased nutrient needs related to wound healing as evidenced by estimated needs.  GOAL:   Patient will meet greater than or equal to 90% of their needs -not met   MONITOR:   PO intake, Supplement acceptance, Labs, Weight trends, Skin, I & O's  ASSESSMENT:   61 y.o.74ale with h/o CHF, Afib, DM, HLD, HTN and ESRD on HD who is admitted with wound infection of right lower extremity s/p I & D with VAC placement 12/23. Pt developed AMS and confusion in hospital and was found to have encephalopathy and infarcts on MRI.  Visited pt's room today. Pt with confusion at time of RD visit and is unable to provide any nutrition related history. Pt noted to have good appetite and oral intake at the beginning of his admission. Pt has had poor oral intake since developing AMS on 12/26. Pt was evaluated by SLP today and made NPO. Vascular recommending AKA; pt will need to make decision about amputation. Recommend NGT placement and nutrition support if patient is unable to be initiated on a diet within the next 24 hrs. Pt is at high refeed risk.  Will plan to check vitamin labs to ensure levels are sufficient for wound healing if plan is for surgery and full aggressive care. Per chart, pt is down 8lbs(3%) since admission. No BM noted since 12/23.   Medications reviewed and include: rena-vit, linezolid, meropenem   Labs reviewed: K 4.6 wnl, BUN 37(H), creat 8.59(H) Hgb 10.3(L), Hct 32.2(L) Cbgs- 94, 79, 88 x 24 hrs  AIC 6.1(H)- 12/21  NUTRITION - FOCUSED PHYSICAL EXAM:  Flowsheet Row Most Recent Value  Orbital Region No depletion  Upper Arm Region Mild depletion  Thoracic and Lumbar Region No depletion  Buccal Region No depletion  Temple Region No depletion  Clavicle Bone Region Moderate depletion  Clavicle and Acromion Bone Region Moderate depletion  Scapular Bone Region No depletion  Dorsal Hand No depletion  Patellar Region Moderate depletion  Anterior Thigh Region Moderate depletion  Posterior Calf Region Moderate depletion  Edema (RD Assessment) None  Hair Reviewed  Eyes Reviewed  Mouth Reviewed  Skin Reviewed  Nails Reviewed   Diet Order:   Diet Order             Diet NPO time specified  Diet effective now                  EDUCATION NEEDS:   Not appropriate for education at this time  Skin:  Skin Assessment: Reviewed RN Assessment (Full thickness post-op wound to right anterior calf. 18X10X1cm.)  Skin Integrity Issues:: Diabetic Ulcer Diabetic Ulcer: right leg  Last BM:  03/05/22  Height:   Ht Readings from Last 1 Encounters:  03/05/22 _0  (1.905 m)    Weight:   Wt Readings from Last 1 Encounters:  03/12/22 111.9 kg   BMI:  Body mass index is 30.83 kg/m.  Estimated Nutritional Needs:   Kcal:  2700-3000kcal/day  Protein:  >135g/day  Fluid:  2.0L/day  Koleen Distance MS, RD, LDN Please refer to [facility] for RD and/or RD on-call/weekend/after hours pager

## 2022-03-12 NOTE — Consult Note (Signed)
Pharmacy Antibiotic Note  Adam Howell is a 61 y.o. male admitted on 03/05/2022 with  wound infection .  Pharmacy has been consulted for meropenem dosing. Patient has ESRD on HD (M/W/F schedule).  ID is following.  Plan: Start Meropenem 500 mg IV every 24 hours.  Height: 6\' 3"  (190.5 cm) Weight: 111.9 kg (246 lb 11.1 oz) IBW/kg (Calculated) : 84.5  Temp (24hrs), Avg:98.5 F (36.9 C), Min:98.1 F (36.7 C), Max:99.1 F (37.3 C)  Recent Labs  Lab 03/05/22 1943 03/07/22 0736 03/08/22 0915 03/08/22 0947 03/10/22 1706 03/12/22 0540  WBC  --  11.5*  --  11.8* 7.9 7.9  CREATININE  --  6.60* 8.74*  --  12.03* 8.59*  LATICACIDVEN 1.3  --   --   --   --   --     Estimated Creatinine Clearance: 12.4 mL/min (A) (by C-G formula based on SCr of 8.59 mg/dL (H)).    Allergies  Allergen Reactions   Tramadol Rash    Antimicrobials this admission: Vancomycin 12/21 >> 12/28 Zosyn 12/21 >> 12/28 Meropenem 12/28>> Linezolid 12/28>>  Dose adjustments this admission: N/A  Microbiology results: 12/21 BCx: No growth x 5 days 12/23 WCx: Proteus mirabilis (pan sensitive), Citrobacter koseri (I = Pip/tazo), E. Faecium (R = amp and vanco)  12/22 MRSA PCR: negative  Thank you for allowing pharmacy to be a part of this patient's care.  Lorin Picket 03/12/2022 3:37 PM

## 2022-03-12 NOTE — Progress Notes (Signed)
OT Cancellation Note  Patient Details Name: Adam Howell MRN: [id number] DOB: January 22, 1961   Cancelled Treatment:    Reason Eval/Treat Not Completed: Patient not medically ready. Chart reviewed. Pt appears lethargic, not following commands, states name only, appears in pain with all ROM attempts. Noted plan for BKA next date, will sign off and await new orders as pt medically appropriate.   Dessie Coma, M.S. OTR/L  03/12/22, 1:58 PM  ascom [phone]

## 2022-03-12 NOTE — Progress Notes (Signed)
Progress Note   Patient: Adam Howell GMW:[id number] DOB: 09-11-1960 DOA: 03/05/2022     7 DOS: the patient was seen and examined on 03/12/2022   Brief hospital course:  Adam Howell is a 61 y.o. male with medical history significant for diabetes mellitus with complications of end-stage renal disease on hemodialysis (M/W/F), history of A-fib on Eliquis and amiodarone, HFpEF (EF 35 to 40% 2021), secondary hyperparathyroidism with chronic nonhealing right lower extremity wound for which he was hospitalized for debridement and IV antibiotics from 10/31 to 11/6.  He was sent to the ED by his home health nurse due to concerns for increased drainage and foul odor from the wound in spite of ongoing dressings.  Patient also reports increased pain of the lower leg.   MRI right tib-fib suggesting myositis without osteomyelitis. Patient was started on Zosyn and vancomycin.  Patient was taken to the OR for debridement on 12/23.  Wound culture only grew out gram-negative rods, antibiotics switched to Zosyn only.  Patient had a prolonged episode of altered mental status postop, currently improved.  Had another episode of decreased responsiveness on 12/26.  Seen by neurology, most likely metabolic encephalopathy.  MRI of the brain reported as new stroke, neurology has reviewed it, does not believe this is stroke. 12/27.  Patient has dense encephalopathy, after discussion with neurology, ID, determined this is mainly metabolic in origin.     Assessment and Plan: Acute metabolic encephalopathy. Hypotension. Patient had a prolonged altered mental status post op due to end-stage renal disease.  Blood pressure was low overnight, received fluid bolus.  Mental status finally improved. Patient had another episode of mental status change am of 12/26, he was confused, minimally responsive.  Code stroke was called, seen by Dr. Cheral Marker, does not appear to have stroke, I reviewed the CT images together with  exam, he appears to have some old stroke, no new stroke at this time.  MRI was also performed, reportedly a new stroke, but reviewed with Dr. Cheral Marker, does not appear to be stroke.  EEG is consistent with metabolic encephalopathy. Had extensive discussion among ID, neurology and myself, condition mainly from metabolic encephalopathy.  Possibly medication related, partially uremia.  Patient was given a dose of Narcan, did not seem to improve mental status significantly.  He did not receive any additional narcotics.  Patient will be dialyzed again today, hopefully his mental status attending improved when uremia is better. He had ABG yesterday, showed CO2 retention, but oxygen was 156.  Took off oxygen, maintaining oxygen saturation at around 90%.     Wound infection of chronic right lower extremity ulcer Blood culture negative. Patient had a debridement performed 12/23.  Gram stain shows gram negative rods, culture only has gram-negative rods.  Discontinue vancomycin, continue Zosyn for now. Wound culture still not finalized, appears to be largely microbial, but all gram-negative. Patient appears to have nonsalvageable right lower extremity with a severe wound.  Scheduled for below-knee amputation tomorrow.   Chronic systolic CHF (congestive heart failure) (HCC) Condition stable without volume overload.   Type 2 diabetes mellitus with ESRD (end-stage renal disease) (HCC) Hypoglycemia. Continue D10 drip, glucose more stable.   ESRD on dialysis [facility] Patient does not have volume overload, followed by nephrology for scheduled dialysis.   Chronic Atrial fibrillation (HCC) Continue Eliquis and amiodarone   Anemia in chronic kidney disease Stable.   Hyperlipidemia associated with type 2 diabetes mellitus ([place]) Continue rosuvastatin   Obesity with BMI 31.87  Subjective:  Patient still confused, but opening eyes.  He knows that he is in the hospital.  No shortness of  breath.  Physical Exam: Vitals:   03/12/22 1030 03/12/22 1104 03/12/22 1109 03/12/22 1116  BP: 111/65 (!) 109/55  (!) 106/57  Pulse: 81 81    Resp: (!) 8     Temp:  98.7 F (37.1 C)    TempSrc:  Axillary    SpO2: 95% 94%  95%  Weight:   111.9 kg   Height:       General exam: Appears calm and comfortable  Respiratory system: Clear to auscultation. Respiratory effort normal. Cardiovascular system: S1 & S2 heard, RRR. No JVD, murmurs, rubs, gallops or clicks. No pedal edema. Gastrointestinal system: Abdomen is nondistended, soft and nontender. No organomegaly or masses felt. Normal bowel sounds heard. Central nervous system: Drowsy and oriented x2. No focal neurological deficits. Extremities: Symmetric 5 x 5 power. Skin: No rashes, lesions or ulcers    Data Reviewed:  Results reviewed.  Family Communication: daughter updated   Disposition: Status is: Inpatient Remains inpatient appropriate because: severity of disease, iv abx, altered mental status  Planned Discharge Destination:  tbd    Time spent: 50 minutes  Author: Sharen Hones, MD 03/12/2022 11:20 AM  For on call review [email or web address].

## 2022-03-12 NOTE — Plan of Care (Addendum)
Patient is alert and responding with eye opening and making sounds but not verbally responding.Noted he is having some possible seizure activity(twitching) on his head and face for approx 4-64mins and stopped.He got Keppra IV , Vitals stable T 98.86F BP 122/64 HR 88 MAP 82.Provider informed,no new orders. Problem: Education: Goal: Knowledge of General Education information will improve Description: Including pain rating scale, medication(s)/side effects and non-pharmacologic comfort measures Outcome: Progressing   Problem: Health Behavior/Discharge Planning: Goal: Ability to manage health-related needs will improve Outcome: Progressing   Problem: Clinical Measurements: Goal: Ability to maintain clinical measurements within normal limits will improve Outcome: Progressing Goal: Will remain free from infection Outcome: Progressing Goal: Diagnostic test results will improve Outcome: Progressing Goal: Respiratory complications will improve Outcome: Progressing Goal: Cardiovascular complication will be avoided Outcome: Progressing   Problem: Activity: Goal: Risk for activity intolerance will decrease Outcome: Progressing   Problem: Nutrition: Goal: Adequate nutrition will be maintained Outcome: Progressing   Problem: Coping: Goal: Level of anxiety will decrease Outcome: Progressing   Problem: Elimination: Goal: Will not experience complications related to bowel motility Outcome: Progressing Goal: Will not experience complications related to urinary retention Outcome: Progressing   Problem: Pain Managment: Goal: General experience of comfort will improve Outcome: Progressing   Problem: Safety: Goal: Ability to remain free from injury will improve Outcome: Progressing   Problem: Skin Integrity: Goal: Risk for impaired skin integrity will decrease Outcome: Progressing

## 2022-03-12 NOTE — Progress Notes (Signed)
Patient is now awake and oriented x4. DR. Ravishankar and I have discussed with him, he strongly opposed the amputation. When we posed the question: if you have to die without amputation vs live with amputation, what is your choice. His answer was I would rather die. For now, cannot have surgery.  I have talked with her daughter, she is coming to the hospital to talk to her father.  If she is successful, patient will sign consent.  If not, cannot have surgery.  Patient most likely will need hospice versus comfort care.

## 2022-03-12 NOTE — Progress Notes (Signed)
Date of Admission:  03/05/2022      ID: Adam Howell is a 61 y.o. male Principal Problem:   Wound infection of chronic right lower extremity ulcer Active Problems:   Anemia in chronic kidney disease   ESRD on dialysis ([place])   Obesity (BMI 30-39.9)   Type 2 diabetes mellitus with ESRD (end-stage renal disease) ([place])   Chronic atrial fibrillation with RVR ([place])   Hyperlipidemia associated with type 2 diabetes mellitus ([place])   Chronic systolic CHF (congestive heart failure) (HCC)   Hypokalemia   Hyponatremia   Acute metabolic encephalopathy   Hypotension   Uncontrolled type 2 diabetes mellitus with hypoglycemia, with long-term current use of insulin (HCC)    Subjective: Pt had dialysis again today In the morning during dilaysis he was very somnolent But around 3pm awake and alert Oriented X 4 Says he does not want amputation  Medications:   (feeding supplement) PROSource Plus  30 mL Oral BID BM   amiodarone  200 mg Oral BID   apixaban  2.5 mg Oral BID   bumetanide  1 mg Oral BID   Chlorhexidine Gluconate Cloth  6 each Topical Q0600   multivitamin  1 tablet Oral QHS   nutrition supplement (JUVEN)  1 packet Oral BID BM   rosuvastatin  40 mg Oral Daily    Objective: Vital signs in last 24 hours: Temp:  [98.1 F (36.7 C)-99.1 F (37.3 C)] 98.7 F (37.1 C) (12/28 0756) Pulse Rate:  [68-90] 69 (12/28 0830) Resp:  [11-20] 15 (12/28 0830) BP: (95-124)/(45-72) 107/65 (12/28 0830) SpO2:  [88 %-100 %] 93 % (12/28 0830) FiO2 (%):  [21 %] 21 % (12/27 2329) Weight:  [111.6 kg-118.2 kg] 118.2 kg (12/28 0752)   PHYSICAL EXAM:  General: Awake, alert, BIPAP, knows he is hospital, knows the month, able to say the name of his wife and daughter BIPAP Lungs: b/l air entry Heart: irregular. Abdomen: Soft, non-tender,not distended. Bowel sounds normal. No masses Extremities: rt leg Picture reviewed- the wound vac was removed today Tendon exposed Surrounding discoloration  of skin  Edema of the leg Skin: No rashes or lesions. Or bruising Lymph: Cervical, supraclavicular normal. Neurologic: Grossly non-focal  Lab Results Recent Labs    03/10/22 1706 03/12/22 0540  WBC 7.9 7.9  HGB 10.0* 10.3*  HCT 31.5* 32.2*  NA 133* 136  K 5.2* 4.6  CL 93* 96*  CO2 25 27  BUN 53* 37*  CREATININE 12.03* 8.59*   Liver Panel Recent Labs    03/12/22 0540  PROT 7.1  ALBUMIN 2.5*  AST 30  ALT 12  ALKPHOS 71  BILITOT 1.3*   Sedimentation Rate No results for input(s): "ESRSEDRATE" in the last 72 hours. C-Reactive Protein No results for input(s): "CRP" in the last 72 hours.  Microbiology: Wound culture- polymicrobial with proteus, citrobacter and VRE Studies/Results: EEG adult  Result Date: 03/11/2022 Lora Havens, MD     03/11/2022  9:36 PM Patient Name: Adam Howell MRN: [id number] Epilepsy Attending: Lora Havens Referring Physician/Provider: Kerney Elbe, MD Date: 03/10/2022 Duration: 30.50 mins Patient history: 61 year old male with altered mental status.  EEG to evaluate for seizure. Level of alertness: lethargic AEDs during EEG study: LEV Technical aspects: This EEG study was done with scalp electrodes positioned according to the 10-20 International system of electrode placement. Electrical activity was reviewed with band pass filter of 1-70Hz , sensitivity of 7 uV/mm, display speed of 20mm/sec with a 60Hz  notched filter applied as  appropriate. EEG data were recorded continuously and digitally stored.  Video monitoring was available and reviewed as appropriate. Description: EEG showed continuous generalized 3 to 6 Hz theta-delta slowing. Hyperventilation and photic stimulation were not performed.   ABNORMALITY - Continuous slow, generalized IMPRESSION: This study is suggestive of moderate to severe diffuse encephalopathy, nonspecific etiology. No seizures or epileptiform discharges were seen throughout the recording. Lora Havens   MR  BRAIN WO CONTRAST  Result Date: 03/10/2022 CLINICAL DATA:  Neuro deficit with acute stroke suspected EXAM: MRI HEAD WITHOUT CONTRAST TECHNIQUE: Multiplanar, multiecho pulse sequences of the brain and surrounding structures were obtained without intravenous contrast. COMPARISON:  Head CT from earlier today FINDINGS: Brain: Punctate recent infarct in the right corona radiata and anterior frontal lobe, see markings on diffusion images. Small remote right cerebellar infarct. Small remote cortical infarcts scattered along the left frontal and parietal cortex. Rounded area of intense hemosiderin deposition in the right cerebellum measuring 16 mm, primarily low-density on head CT and likely old hemorrhagic lacunar infarct rather than cavernoma. Chronic lacunar infarct at the left external capsule. Generalized cerebral volume loss. No hemorrhage, hydrocephalus, or collection. Vascular: Major flow voids are preserved Skull and upper cervical spine: No focal marrow lesion. Low marrow signal in the covered cervical spine, homogeneous and usually physiologic. Sinuses/Orbits: No acute finding IMPRESSION: 1. Two small acute or subacute infarcts in the right cerebral white matter. 2. Prior lacunar and left frontoparietal cortex infarcts. 3. Remote hemorrhagic insult in the right cerebellum Electronically Signed   By: Jorje Guild M.D.   On: 03/10/2022 15:16   EEG adult  Result Date: 03/10/2022 Lora Havens, MD     03/10/2022 12:25 PM Patient Name: Adam Howell MRN: [id number] Epilepsy Attending: Lora Havens Referring Physician/Provider: Kerney Elbe, MD Date: 03/10/2022 Duration: 25.57 mins Patient history: 61yo M with second episode of awake unresponsiveness. Subtle BUE intermittent jerking versus asterixis seen on exam.  EEG to evaluate for seizure. Level of alertness: lethargic AEDs during EEG study: None Technical aspects: This EEG study was done with scalp electrodes positioned according to the  10-20 International system of electrode placement. Electrical activity was reviewed with band pass filter of 1-70Hz , sensitivity of 7 uV/mm, display speed of 32mm/sec with a 60Hz  notched filter applied as appropriate. EEG data were recorded continuously and digitally stored.  Video monitoring was available and reviewed as appropriate. Description: EEG showed continuous generalized 3 to 5 Hz theta-delta slowing. Hyperventilation and photic stimulation were not performed.   ABNORMALITY - Continuous slow, generalized IMPRESSION: This study is suggestive of severe diffuse encephalopathy, nonspecific etiology. No seizures or epileptiform discharges were seen throughout the recording. Priyanka [name]     Assessment/Plan:  Altered mental status due to Acute metabolic encephalopathy- combination of Uremic encephalopathy, not getting dialysis for 5 days, opioid accumulation in the system along with other CNS depressant drugs ( ativan, gabapentin), Co2 retention due to underlying OSA Pt is much better today after 2 days of back to back dialysis Also BIPAP has helped today No seizures  Non healing  ulcer of the rt shin- with tendon and bone exposed Multiple organisms cultured- could be colonizing the wound proteus VRE and Citrobacter- will change zosyn to meropenem and willa dd linezolid for VRE This is not going to heal with just antibiotics- infact antibiotics may not even penetrate the wound well-  now present for atleast a year - simple debridement has been done twice. Does he have vascular insufficiency? Does he  need angio? Looking at the clinical picture and imaging there is no clear evidence of nec fasc. But still this is a deep non healing wound  and vascular recommending AKA as this limb is not salvageable. Pt does not want to have amputation- HE says he is even prepared to die but with leg intact   ESRD  Afib on amiodarone I saw the patient with Dr.Zhang today Recommend palliative consult

## 2022-03-12 NOTE — Consult Note (Signed)
[place] Nurse wound follow up Patient has returned to room from dialysis.   In room to change NPWT (VAC) dressing.  A[place], bedside RN indicates that vascular team coming by later to remove VAC dressing to right lateral leg and transition to NS Moist gauze.  Supplies returned to supply room.  No care performed.  Will not follow at this time.  Please re-consult if needed.  Estrellita Ludwig MSN, RN, FNP-BC CWON Wound, Ostomy, Continence Nurse [facility] [phone] Pager [phone]

## 2022-03-12 NOTE — Progress Notes (Signed)
[facility], [place] 03/12/22  Subjective:   LOS: 7  Patient known to our practice from previous admissions.  Patient admitted for worsening right lower extremity ulcer.  Currently getting IV broad-spectrum antibiotics.   Patient underwent irrigation and debridement of right lower extremity and placement of wound VAC.  Patient seen and evaluated during dialysis   HEMODIALYSIS FLOWSHEET:  Blood Flow Rate (mL/min): 400 mL/min Arterial Pressure (mmHg): -170 mmHg Venous Pressure (mmHg): 230 mmHg TMP (mmHg): -9 mmHg Ultrafiltration Rate (mL/min): 142 mL/min Dialysate Flow Rate (mL/min): 300 ml/min Dialysis Fluid Bolus: Normal Saline Bolus Amount (mL): 200 mL  Arousable to sternal rub, following intermittent simple commands Remains on oxygen, 1 L  Objective:  Vital signs in last 24 hours:  Temp:  [98.1 F (36.7 C)-99.1 F (37.3 C)] 98.7 F (37.1 C) (12/28 1104) Pulse Rate:  [68-90] 81 (12/28 1104) Resp:  [8-20] 8 (12/28 1030) BP: (96-126)/(45-69) 106/57 (12/28 1116) SpO2:  [88 %-99 %] 95 % (12/28 1116) FiO2 (%):  [21 %] 21 % (12/27 2329) Weight:  [111.6 kg-111.9 kg] 111.9 kg (12/28 1109)  Weight change:  Filed Weights   03/11/22 1422 03/12/22 0752 03/12/22 1109  Weight: 111.6 kg 111.9 kg 111.9 kg    Intake/Output:    Intake/Output Summary (Last 24 hours) at 03/12/2022 1208 Last data filed at 03/12/2022 1104 Gross per 24 hour  Intake 518.18 ml  Output 150 ml  Net 368.18 ml      Physical Exam: General: Chronically ill-appearing, laying in the bed  HEENT Moist oral mucous membranes  Pulm/lungs Normal breathing effort  CVS/Heart No rub  Abdomen:  Soft, nontender, nondistended  Extremities: Chronic right lower extremity wound with wound VAC, trace edema  Neurologic: Response to painful stimuli  Skin: No acute rashes  Access: AV fistula       Basic Metabolic Panel:  Recent Labs  Lab 03/05/22 1323 03/07/22 0736 03/08/22 0915  03/10/22 1706 03/12/22 0540  NA 133* 134* 135 133* 136  K 3.2* 4.4 4.1 5.2* 4.6  CL 89* 94* 95* 93* 96*  CO2 30 30 26 25 27   GLUCOSE 102* 108* 103* 78 88  BUN 26* 24* 35* 53* 37*  CREATININE 7.14* 6.60* 8.74* 12.03* 8.59*  CALCIUM 8.8* 8.2* 8.4* 8.6* 8.4*  PHOS  --   --  6.1*  --   --       CBC: Recent Labs  Lab 03/05/22 1323 03/07/22 0736 03/08/22 0947 03/10/22 1706 03/12/22 0540  WBC 9.1 11.5* 11.8* 7.9 7.9  NEUTROABS 7.0  --   --   --   --   HGB 12.9* 11.4* 10.1* 10.0* 10.3*  HCT 40.5 35.3* 33.0* 31.5* 32.2*  MCV 87.5 87.4 88.9 88.5 88.2  PLT 350 344 331 353 341       Lab Results  Component Value Date   HEPBSAG NON REACTIVE 03/06/2022      Microbiology:  Recent Results (from the past 240 hour(s))  Culture, blood (routine x 2)     Status: None   Collection Time: 03/05/22  7:43 PM   Specimen: BLOOD  Result Value Ref Range Status   Specimen Description BLOOD BLOOD RIGHT ARM  Final   Special Requests   Final    BOTTLES DRAWN AEROBIC AND ANAEROBIC Blood Culture results may not be optimal due to an inadequate volume of blood received in culture bottles   Culture   Final    NO GROWTH 5 DAYS Performed at [facility],  [place], [place] [address]    Report Status 03/10/2022 FINAL  Final  Surgical PCR screen     Status: None   Collection Time: 03/06/22 11:31 PM   Specimen: Nasal Mucosa; Nasal Swab  Result Value Ref Range Status   MRSA, PCR NEGATIVE NEGATIVE Final   Staphylococcus aureus NEGATIVE NEGATIVE Final    Comment: (NOTE) The Xpert SA Assay (FDA approved for NASAL specimens in patients 69 years of age and older), is one component of a comprehensive surveillance program. It is not intended to diagnose infection nor to guide or monitor treatment. Performed at [facility], [address], [place], [place] [address]   Aerobic/Anaerobic Culture w Gram Stain (surgical/deep wound)     Status: None   Collection Time:  03/07/22  9:50 AM   Specimen: Wound  Result Value Ref Range Status   Specimen Description   Final    WOUND Performed at [facility], [address], [place], [place] [address]    Special Requests   Final    right lower extremity wound Performed at [facility], [address] [place], [place] [address]    Gram Stain   Final    FEW GRAM NEGATIVE RODS RARE WBC PRESENT, PREDOMINANTLY PMN    Culture   Final    FEW CITROBACTER KOSERI FEW ENTEROCOCCUS FAECIUM FEW PROTEUS MIRABILIS VANCOMYCIN RESISTANT ENTEROCOCCUS ISOLATED NO ANAEROBES ISOLATED Performed at [facility], [place] [address] [place], [place] [address]    Report Status 03/12/2022 FINAL  Final   Organism ID, Bacteria PROTEUS MIRABILIS  Final   Organism ID, Bacteria CITROBACTER KOSERI  Final   Organism ID, Bacteria ENTEROCOCCUS FAECIUM  Final      Susceptibility   Citrobacter koseri - MIC*    CEFAZOLIN 16 SENSITIVE Sensitive     CEFEPIME 0.5 SENSITIVE Sensitive     CEFTAZIDIME 2 SENSITIVE Sensitive     CEFTRIAXONE 0.5 SENSITIVE Sensitive     CIPROFLOXACIN <=0.25 SENSITIVE Sensitive     GENTAMICIN <=1 SENSITIVE Sensitive     IMIPENEM <=0.25 SENSITIVE Sensitive     TRIMETH/SULFA <=20 SENSITIVE Sensitive     PIP/TAZO 64 INTERMEDIATE Intermediate     * FEW CITROBACTER KOSERI   Enterococcus faecium - MIC*    AMPICILLIN >=32 RESISTANT Resistant     VANCOMYCIN >=32 RESISTANT Resistant     GENTAMICIN SYNERGY SENSITIVE Sensitive     LINEZOLID 2 SENSITIVE Sensitive     * FEW ENTEROCOCCUS FAECIUM   Proteus mirabilis - MIC*    AMPICILLIN <=2 SENSITIVE Sensitive     CEFAZOLIN 8 SENSITIVE Sensitive     CEFEPIME 0.25 SENSITIVE Sensitive     CEFTAZIDIME <=1 SENSITIVE Sensitive     CEFTRIAXONE 0.5 SENSITIVE Sensitive     CIPROFLOXACIN <=0.25 SENSITIVE Sensitive     GENTAMICIN <=1 SENSITIVE Sensitive     IMIPENEM 2 SENSITIVE Sensitive     TRIMETH/SULFA <=20 SENSITIVE Sensitive      AMPICILLIN/SULBACTAM <=2 SENSITIVE Sensitive     PIP/TAZO <=4 SENSITIVE Sensitive     * FEW PROTEUS MIRABILIS    Coagulation Studies: No results for input(s): "LABPROT", "INR" in the last 72 hours.   Urinalysis: No results for input(s): "COLORURINE", "LABSPEC", "PHURINE", "GLUCOSEU", "HGBUR", "BILIRUBINUR", "KETONESUR", "PROTEINUR", "UROBILINOGEN", "NITRITE", "LEUKOCYTESUR" in the last 72 hours.  Invalid input(s): "APPERANCEUR"    Imaging: EEG adult  Result Date: 03/11/2022 Lora Havens, MD     03/11/2022  9:36 PM Patient Name: Albertina Parr  Raybon Conard MRN: [id number] Epilepsy Attending: Lora Havens Referring Physician/Provider: Kerney Elbe, MD Date: 03/10/2022 Duration: 30.50 mins Patient history: 61 year old male with altered mental status.  EEG to evaluate for seizure. Level of alertness: lethargic AEDs during EEG study: LEV Technical aspects: This EEG study was done with scalp electrodes positioned according to the 10-20 International system of electrode placement. Electrical activity was reviewed with band pass filter of 1-70Hz , sensitivity of 7 uV/mm, display speed of 76mm/sec with a 60Hz  notched filter applied as appropriate. EEG data were recorded continuously and digitally stored.  Video monitoring was available and reviewed as appropriate. Description: EEG showed continuous generalized 3 to 6 Hz theta-delta slowing. Hyperventilation and photic stimulation were not performed.   ABNORMALITY - Continuous slow, generalized IMPRESSION: This study is suggestive of moderate to severe diffuse encephalopathy, nonspecific etiology. No seizures or epileptiform discharges were seen throughout the recording. Lora Havens   MR BRAIN WO CONTRAST  Result Date: 03/10/2022 CLINICAL DATA:  Neuro deficit with acute stroke suspected EXAM: MRI HEAD WITHOUT CONTRAST TECHNIQUE: Multiplanar, multiecho pulse sequences of the brain and surrounding structures were obtained without intravenous  contrast. COMPARISON:  Head CT from earlier today FINDINGS: Brain: Punctate recent infarct in the right corona radiata and anterior frontal lobe, see markings on diffusion images. Small remote right cerebellar infarct. Small remote cortical infarcts scattered along the left frontal and parietal cortex. Rounded area of intense hemosiderin deposition in the right cerebellum measuring 16 mm, primarily low-density on head CT and likely old hemorrhagic lacunar infarct rather than cavernoma. Chronic lacunar infarct at the left external capsule. Generalized cerebral volume loss. No hemorrhage, hydrocephalus, or collection. Vascular: Major flow voids are preserved Skull and upper cervical spine: No focal marrow lesion. Low marrow signal in the covered cervical spine, homogeneous and usually physiologic. Sinuses/Orbits: No acute finding IMPRESSION: 1. Two small acute or subacute infarcts in the right cerebral white matter. 2. Prior lacunar and left frontoparietal cortex infarcts. 3. Remote hemorrhagic insult in the right cerebellum Electronically Signed   By: Jorje Guild M.D.   On: 03/10/2022 15:16   EEG adult  Result Date: 03/10/2022 Lora Havens, MD     03/10/2022 12:25 PM Patient Name: Joby Richart MRN: [id number] Epilepsy Attending: Lora Havens Referring Physician/Provider: Kerney Elbe, MD Date: 03/10/2022 Duration: 25.57 mins Patient history: 61yo M with second episode of awake unresponsiveness. Subtle BUE intermittent jerking versus asterixis seen on exam.  EEG to evaluate for seizure. Level of alertness: lethargic AEDs during EEG study: None Technical aspects: This EEG study was done with scalp electrodes positioned according to the 10-20 International system of electrode placement. Electrical activity was reviewed with band pass filter of 1-70Hz , sensitivity of 7 uV/mm, display speed of 59mm/sec with a 60Hz  notched filter applied as appropriate. EEG data were recorded continuously and  digitally stored.  Video monitoring was available and reviewed as appropriate. Description: EEG showed continuous generalized 3 to 5 Hz theta-delta slowing. Hyperventilation and photic stimulation were not performed.   ABNORMALITY - Continuous slow, generalized IMPRESSION: This study is suggestive of severe diffuse encephalopathy, nonspecific etiology. No seizures or epileptiform discharges were seen throughout the recording. Priyanka [name]     Medications:    anticoagulant sodium citrate     dextrose 20 mL/hr at 03/12/22 0641   levETIRAcetam 750 mg (03/11/22 2244)   levETIRAcetam     piperacillin-tazobactam (ZOSYN)  IV 100 mL/hr at 03/12/22 0641    (feeding supplement) PROSource Plus  30  mL Oral BID BM   amiodarone  200 mg Oral BID   apixaban  2.5 mg Oral BID   bumetanide  1 mg Oral BID   Chlorhexidine Gluconate Cloth  6 each Topical Q0600   multivitamin  1 tablet Oral QHS   nutrition supplement (JUVEN)  1 packet Oral BID BM   rosuvastatin  40 mg Oral Daily   acetaminophen **OR** acetaminophen, alteplase, anticoagulant sodium citrate, dextrose, heparin, lidocaine (PF), lidocaine-prilocaine, ondansetron **OR** ondansetron (ZOFRAN) IV, oxyCODONE-acetaminophen, pentafluoroprop-tetrafluoroeth  Assessment/ Plan:  61 y.o. male with diabetes with complications, ESRD, atrial fibrillation requiring Eliquis, amiodarone, congestive heart failure, secondary hyperparathyroidism, chronic nonhealing right lower extremity wound was admitted on 03/05/2022 for  Principal Problem:   Wound infection of chronic right lower extremity ulcer Active Problems:   Anemia in chronic kidney disease   ESRD on dialysis (HCC)   Obesity (BMI 30-39.9)   Type 2 diabetes mellitus with ESRD (end-stage renal disease) (HCC)   Chronic atrial fibrillation with RVR (HCC)   Hyperlipidemia associated with type 2 diabetes mellitus (HCC)   Chronic systolic CHF (congestive heart failure) (HCC)   Hypokalemia   Hyponatremia    Acute metabolic encephalopathy   Hypotension   Uncontrolled type 2 diabetes mellitus with hypoglycemia, with long-term current use of insulin ([place])  Unspecified open wound, right lower leg, initial encounter [S81.801A] Necrotizing fasciitis of lower leg ([place]) [M72.6]  #. ESRD [facility]/MWF/left aVF   Received dialysis yesterday, UF 0.  Received additional treatment today due to continued encephalopathy.  Next treatment scheduled for Friday.  #. Anemia of CKD  Lab Results  Component Value Date   HGB 10.3 (L) 03/12/2022  Hemoglobin remains within desired range. No need for ESA's at this time.  #. Secondary hyperparathyroidism of renal origin N 25.81   No results found for: "PTH" Lab Results  Component Value Date   PHOS 6.1 (H) 03/08/2022   Calcium remains acceptable, 8.4.  Will continue to monitor bone minerals.   #.  Nonhealing right lower extremity ulcer with wound infection -Broad-spectrum antibiotics, Zosyn -Irrigation and debridement, placement of wound VAC on 03/07/2022.  # Acute metabolic encephalopathy.  MRI head suggestive of subacute infarct however neurology has disputed this finding.  EEG performed, suggestive of metabolic encephalopathy.  Patient has received IV pain medications and gabapentin this past weekend without receiving a complete dialysis treatment. Received Narcan yesterday evening, unknown results. Will dialyze today to offer additional cleaning.    LOS: [place] 12/28/202312:08 PM  [facility] [place], [place]

## 2022-03-12 NOTE — Plan of Care (Signed)
Patient significantly improved after dialysis sessions yesterday and today. Now awake and alert. Seizure now essentially off the DDx given that his response to dialysis is most consistent with a toxic/metabolic process having been the etiology for his severe encephalopathy.   Discontinuing Keppra.   Neurohospitalist service will follow PRN. Please call if there are additional questions.   Electronically signed: Dr. Kerney Elbe

## 2022-03-12 NOTE — Progress Notes (Addendum)
PT Cancellation Note  Patient Details Name: Hardie Veltre MRN: [id number] DOB: 1960-12-20   Cancelled Treatment:    Reason Eval/Treat Not Completed: Other (comment). Pt currently off unit for HD. Has been unavailable for 3 attempts for PT eval. Per chart was not verbally responsive over night with possible seizure. Will continue to follow and attempt one last time for evaluation.  1541- addendum- per POC, pt scheduled for R AKA next date. Due to pending surgery, will complete current order. Please re-order once medically stable.   Sharion Grieves 03/12/2022, 10:32 AM Greggory Stallion, PT, DPT, GCS [phone]

## 2022-03-13 ENCOUNTER — Encounter
Admission: EM | Disposition: A | Discharge status: Rehabilitation Facility | Payer: Medicare Other | Source: Home / Self Care | Attending: Internal Medicine

## 2022-03-13 ENCOUNTER — Inpatient Hospital Stay: Payer: Medicare Other | Admitting: Anesthesiology

## 2022-03-13 ENCOUNTER — Other Ambulatory Visit: Payer: Self-pay

## 2022-03-13 DIAGNOSIS — G9341 Metabolic encephalopathy: Secondary | ICD-10-CM | POA: Diagnosis not present

## 2022-03-13 DIAGNOSIS — I5022 Chronic systolic (congestive) heart failure: Secondary | ICD-10-CM | POA: Diagnosis not present

## 2022-03-13 DIAGNOSIS — L089 Local infection of the skin and subcutaneous tissue, unspecified: Secondary | ICD-10-CM | POA: Diagnosis not present

## 2022-03-13 DIAGNOSIS — N186 End stage renal disease: Secondary | ICD-10-CM | POA: Diagnosis not present

## 2022-03-13 DIAGNOSIS — T148XXA Other injury of unspecified body region, initial encounter: Secondary | ICD-10-CM | POA: Diagnosis not present

## 2022-03-13 HISTORY — PX: AMPUTATION: SHX166

## 2022-03-13 LAB — GLUCOSE, CAPILLARY
Glucose-Capillary: 80 mg/dL (ref 70–99)
Glucose-Capillary: 85 mg/dL (ref 70–99)
Glucose-Capillary: 108 mg/dL — ABNORMAL HIGH (ref 70–99)
Glucose-Capillary: 114 mg/dL — ABNORMAL HIGH (ref 70–99)

## 2022-03-13 SURGERY — AMPUTATION, ABOVE KNEE
Anesthesia: General | Site: Knee | Laterality: Right

## 2022-03-13 MED ORDER — ACETAMINOPHEN 10 MG/ML IV SOLN
INTRAVENOUS | Status: AC
  Filled 2022-03-13: qty 100

## 2022-03-13 MED ORDER — PROPOFOL 10 MG/ML IV BOLUS
INTRAVENOUS | Status: AC
  Filled 2022-03-13: qty 20

## 2022-03-13 MED ORDER — CHLORHEXIDINE GLUCONATE 4 % EX LIQD: 60.0000 mL | Freq: Once | CUTANEOUS | Status: DC

## 2022-03-13 MED ORDER — ACETAMINOPHEN 10 MG/ML IV SOLN: 1000.0000 mg | Freq: Once | INTRAVENOUS | Status: DC | PRN

## 2022-03-13 MED ORDER — PROPOFOL 10 MG/ML IV BOLUS
INTRAVENOUS | Status: DC | PRN
  Administered 2022-03-13: 150 mg via INTRAVENOUS

## 2022-03-13 MED ORDER — LACTATED RINGERS IV SOLN: INTRAVENOUS | Status: DC

## 2022-03-13 MED ORDER — FENTANYL CITRATE (PF) 100 MCG/2ML IJ SOLN
25.0000 ug | INTRAMUSCULAR | Status: DC | PRN
  Administered 2022-03-13: 50 ug via INTRAVENOUS
  Administered 2022-03-13 (×3): 25 ug via INTRAVENOUS

## 2022-03-13 MED ORDER — SODIUM CHLORIDE 0.9 % IV SOLN: INTRAVENOUS | Status: DC

## 2022-03-13 MED ORDER — MIDAZOLAM HCL 2 MG/2ML IJ SOLN: 2.0000 mg | Freq: Once | INTRAMUSCULAR | Status: AC

## 2022-03-13 MED ORDER — FENTANYL CITRATE (PF) 100 MCG/2ML IJ SOLN
INTRAMUSCULAR | Status: DC | PRN
  Administered 2022-03-13 (×4): 25 ug via INTRAVENOUS

## 2022-03-13 MED ORDER — CEFAZOLIN SODIUM-DEXTROSE 2-4 GM/100ML-% IV SOLN: 2.0000 g | INTRAVENOUS | Status: DC

## 2022-03-13 MED ORDER — DEXAMETHASONE SODIUM PHOSPHATE 10 MG/ML IJ SOLN
INTRAMUSCULAR | Status: DC | PRN
  Administered 2022-03-13: 10 mg via INTRAVENOUS

## 2022-03-13 MED ORDER — FENTANYL CITRATE (PF) 100 MCG/2ML IJ SOLN
INTRAMUSCULAR | Status: AC
  Filled 2022-03-13: qty 2

## 2022-03-13 MED ORDER — HYDROMORPHONE HCL 1 MG/ML IJ SOLN: 1.0000 mg | Freq: Once | INTRAMUSCULAR | Status: AC | PRN

## 2022-03-13 MED ORDER — LIDOCAINE HCL (CARDIAC) PF 100 MG/5ML IV SOSY
PREFILLED_SYRINGE | INTRAVENOUS | Status: DC | PRN
  Administered 2022-03-13: 80 mg via INTRAVENOUS

## 2022-03-13 MED ORDER — DEXMEDETOMIDINE HCL IN NACL 80 MCG/20ML IV SOLN
INTRAVENOUS | Status: DC | PRN
  Administered 2022-03-13 (×2): 4 ug via BUCCAL

## 2022-03-13 MED ORDER — ACETAMINOPHEN 10 MG/ML IV SOLN
INTRAVENOUS | Status: DC | PRN
  Administered 2022-03-13: 1000 mg via INTRAVENOUS

## 2022-03-13 MED ORDER — METHYLPREDNISOLONE SODIUM SUCC 125 MG IJ SOLR: 125.0000 mg | Freq: Once | INTRAMUSCULAR | Status: DC | PRN

## 2022-03-13 MED ORDER — MIDAZOLAM HCL 2 MG/ML PO SYRP: 8.0000 mg | ORAL_SOLUTION | Freq: Once | ORAL | Status: DC | PRN

## 2022-03-13 MED ORDER — OXYCODONE HCL 5 MG/5ML PO SOLN: 5.0000 mg | Freq: Once | ORAL | Status: DC | PRN

## 2022-03-13 MED ORDER — 0.9 % SODIUM CHLORIDE (POUR BTL) OPTIME
TOPICAL | Status: DC | PRN
  Administered 2022-03-13: 500 mL

## 2022-03-13 MED ORDER — DIPHENHYDRAMINE HCL 50 MG/ML IJ SOLN: 50.0000 mg | Freq: Once | INTRAMUSCULAR | Status: DC | PRN

## 2022-03-13 MED ORDER — FAMOTIDINE 20 MG PO TABS: 40.0000 mg | ORAL_TABLET | Freq: Once | ORAL | Status: DC | PRN

## 2022-03-13 MED ORDER — FENTANYL CITRATE PF 50 MCG/ML IJ SOSY: 12.5000 ug | PREFILLED_SYRINGE | Freq: Once | INTRAMUSCULAR | Status: DC | PRN

## 2022-03-13 MED ORDER — MIDAZOLAM HCL 2 MG/2ML IJ SOLN
INTRAMUSCULAR | Status: AC
  Administered 2022-03-13: 2 mg via INTRAVENOUS
  Filled 2022-03-13: qty 2

## 2022-03-13 MED ORDER — MIDAZOLAM HCL 2 MG/2ML IJ SOLN
INTRAMUSCULAR | Status: AC
  Filled 2022-03-13: qty 2

## 2022-03-13 MED ORDER — HYDROMORPHONE HCL 1 MG/ML IJ SOLN
INTRAMUSCULAR | Status: AC
  Administered 2022-03-13: 1 mg via INTRAVENOUS
  Filled 2022-03-13: qty 1

## 2022-03-13 MED ORDER — OXYCODONE HCL 5 MG PO TABS: 5.0000 mg | ORAL_TABLET | Freq: Once | ORAL | Status: DC | PRN

## 2022-03-13 MED ORDER — ONDANSETRON HCL 4 MG/2ML IJ SOLN
INTRAMUSCULAR | Status: DC | PRN
  Administered 2022-03-13: 4 mg via INTRAVENOUS

## 2022-03-13 MED ORDER — MIDAZOLAM HCL 2 MG/2ML IJ SOLN
INTRAMUSCULAR | Status: DC | PRN
  Administered 2022-03-13: 2 mg via INTRAVENOUS

## 2022-03-13 MED ORDER — ONDANSETRON HCL 4 MG/2ML IJ SOLN: 4.0000 mg | Freq: Four times a day (QID) | INTRAMUSCULAR | Status: DC | PRN

## 2022-03-13 MED ORDER — PHENYLEPHRINE HCL (PRESSORS) 10 MG/ML IV SOLN
INTRAVENOUS | Status: DC | PRN
  Administered 2022-03-13 (×2): 80 ug via INTRAVENOUS
  Administered 2022-03-13: 160 ug via INTRAVENOUS
  Administered 2022-03-13 (×4): 80 ug via INTRAVENOUS

## 2022-03-13 MED ORDER — FENTANYL CITRATE (PF) 100 MCG/2ML IJ SOLN
INTRAMUSCULAR | Status: AC
  Administered 2022-03-13: 25 ug via INTRAVENOUS
  Filled 2022-03-13: qty 2

## 2022-03-13 MED ORDER — ONDANSETRON HCL 4 MG/2ML IJ SOLN: 4.0000 mg | Freq: Once | INTRAMUSCULAR | Status: DC | PRN

## 2022-03-13 SURGICAL SUPPLY — 35 items
BLADE SAGITTAL WIDE XTHICK NO (BLADE) ×1 IMPLANT
BNDG CMPR 5X4 CHSV STRCH STRL (GAUZE/BANDAGES/DRESSINGS) ×1
BNDG COHESIVE 4X5 TAN STRL LF (GAUZE/BANDAGES/DRESSINGS) ×1 IMPLANT
BNDG ELASTIC 6X5.8 VLCR STR LF (GAUZE/BANDAGES/DRESSINGS) IMPLANT
BNDG GAUZE DERMACEA FLUFF 4 (GAUZE/BANDAGES/DRESSINGS) IMPLANT
BNDG GZE DERMACEA 4 6PLY (GAUZE/BANDAGES/DRESSINGS) ×2
DRAPE INCISE IOBAN 66X45 STRL (DRAPES) ×1 IMPLANT
DRAPE INCISE IOBAN 66X60 STRL (DRAPES) ×1 IMPLANT
DRSG XEROFORM 1X8 (GAUZE/BANDAGES/DRESSINGS) IMPLANT
ELECT CAUTERY BLADE 6.4 (BLADE) ×1 IMPLANT
ELECT REM PT RETURN 9FT ADLT (ELECTROSURGICAL) ×1
ELECTRODE REM PT RTRN 9FT ADLT (ELECTROSURGICAL) ×1 IMPLANT
GLOVE BIO SURGEON STRL SZ7 (GLOVE) ×2 IMPLANT
GOWN STRL REUS W/ TWL LRG LVL3 (GOWN DISPOSABLE) ×2 IMPLANT
GOWN STRL REUS W/ TWL XL LVL3 (GOWN DISPOSABLE) ×1 IMPLANT
GOWN STRL REUS W/TWL LRG LVL3 (GOWN DISPOSABLE) ×2
GOWN STRL REUS W/TWL XL LVL3 (GOWN DISPOSABLE) ×1
HANDLE YANKAUER SUCT BULB TIP (MISCELLANEOUS) ×1 IMPLANT
KIT TURNOVER KIT A (KITS) ×1 IMPLANT
LABEL OR SOLS (LABEL) ×1 IMPLANT
MANIFOLD NEPTUNE II (INSTRUMENTS) ×1 IMPLANT
NS IRRIG 1000ML POUR BTL (IV SOLUTION) ×1 IMPLANT
PACK EXTREMITY ARMC (MISCELLANEOUS) ×1 IMPLANT
PAD ABD DERMACEA PRESS 5X9 (GAUZE/BANDAGES/DRESSINGS) IMPLANT
PAD PREP 24X41 OB/GYN DISP (PERSONAL CARE ITEMS) ×1 IMPLANT
SPONGE T-LAP 18X18 ~~LOC~~+RFID (SPONGE) ×2 IMPLANT
STAPLER SKIN PROX 35W (STAPLE) ×1 IMPLANT
STOCKINETTE M/LG 89821 (MISCELLANEOUS) ×1 IMPLANT
SUT SILK 2 0 (SUTURE) ×1
SUT SILK 2 0 SH (SUTURE) ×2 IMPLANT
SUT SILK 2-0 18XBRD TIE 12 (SUTURE) ×1 IMPLANT
SUT VIC AB 0 CT1 36 (SUTURE) ×2 IMPLANT
SUT VIC AB 2-0 CT1 (SUTURE) ×2 IMPLANT
TRAP FLUID SMOKE EVACUATOR (MISCELLANEOUS) ×1 IMPLANT
WATER STERILE IRR 500ML POUR (IV SOLUTION) ×1 IMPLANT

## 2022-03-13 NOTE — Progress Notes (Signed)
SLP Cancellation Note  Patient Details Name: Adam Howell MRN: [id number] DOB: Jan 21, 1961   Cancelled treatment:       Reason Eval/Treat Not Completed: Patient at procedure or test/unavailable  Pt is out of room at HD.   Secure chat sent to attending (Dr Roosevelt Locks) and I spoke with pt's nurse Jerene Pitch). Pt attending's chat, "I believe it is safe to start a diet. His mental status has improved. No confusion after surgery. You may ask RN TO do a bedside swallow and start a diet if ok."  RN is aware of MD instruction.   ST will sigh off at this time.    Theressa Piedra B. Rutherford Nail, M.S., CCC-SLP, Mining engineer Certified Brain Injury [place]  [place] Office [phone] Ascom [phone] Fax [phone]

## 2022-03-13 NOTE — Plan of Care (Signed)

## 2022-03-13 NOTE — Anesthesia Procedure Notes (Signed)
Procedure Name: LMA Insertion Date/Time: 03/13/2022 7:56 AM  Performed by: Biagio Borg, CRNAPre-anesthesia Checklist: Patient identified, Emergency Drugs available, Suction available and Patient being monitored Patient Re-evaluated:Patient Re-evaluated prior to induction Oxygen Delivery Method: Circle system utilized Preoxygenation: Pre-oxygenation with 100% oxygen Induction Type: IV induction Ventilation: Mask ventilation without difficulty LMA: LMA inserted LMA Size: 4.0 Tube type: Oral Number of attempts: 1 Placement Confirmation: positive ETCO2 and breath sounds checked- equal and bilateral Tube secured with: Tape Dental Injury: Teeth and Oropharynx as per pre-operative assessment

## 2022-03-13 NOTE — TOC Progression Note (Signed)
Transition of Care [facility]) - Progression Note    Patient Details  Name: Adam Howell MRN: [id number] Date of Birth: July 10, 1960  Transition of Care [facility]) CM/SW Contact  Beverly Sessions, RN Phone Number: 03/13/2022, 2:47 PM  Clinical Narrative:     POD 0 AKA. Will need PT eval when medically appropriate   Expected Discharge Plan: [place] Barriers to Discharge: Continued Medical Work up  Expected Discharge Plan and Services                                               Social Determinants of Health (SDOH) Interventions [place]: Food Insecurity Present (03/06/2022)  Housing: Low Risk  (03/06/2022)  Transportation Needs: No Transportation Needs (03/06/2022)  Utilities: Not At Risk (03/06/2022)  Alcohol Screen: Low Risk  (11/25/2021)  Depression (PHQ2-9): Low Risk  (11/25/2021)  Financial Resource Strain: Low Risk  (11/25/2021)  Physical Activity: Insufficiently Active (11/25/2021)  Social Connections: Moderately Isolated (11/25/2021)  Stress: No Stress Concern Present (11/25/2021)  Tobacco Use: Low Risk  (03/12/2022)    Readmission Risk Interventions     No data to display

## 2022-03-13 NOTE — Op Note (Signed)
  [facility]   OPERATIVE NOTE   PROCEDURE:  Right above-the-knee amputation  PRE-OPERATIVE DIAGNOSIS: Right leg infected wound and exposed tendons creating non-salvageable leg  POST-OPERATIVE DIAGNOSIS: same as above  SURGEON:  Leotis Pain, MD  ASSISTANT(S): Annalee Genta, NP  ANESTHESIA: general  ESTIMATED BLOOD LOSS: 200 cc  FINDING(S): none  SPECIMEN(S):  Right above-the-knee amputation  INDICATIONS:   Adam Howell is a 61 y.o. male who presents with right leg wound with infection and exposed tendons that is non-salvageable.  The patient is scheduled for a right above-the-knee amputation.  I discussed in depth with the patient the risks, benefits, and alternatives to this procedure.  The patient is aware that the risk of this operation included but are not limited to:  bleeding, infection, myocardial infarction, stroke, death, failure to heal amputation wound, and possible need for more proximal amputation.  The patient is aware of the risks and agrees proceed forward with the procedure. An assistant was present during the procedure to help facilitate the exposure and expedite the procedure.   DESCRIPTION: After full informed written consent was obtained from the patient, the patient was taken to the operating room, and placed supine upon the operating table.  Prior to induction, the patient received IV antibiotics.  The patient was then prepped and draped in the standard fashion for a right above-the-knee amputation. The assistant provided retraction and mobilization to help facilitate exposure and expedite the procedure throughout the entire procedure.  This included following suture, using retractors, and optimizing lighting.  After obtaining adequate anesthesia, the patient was prepped and draped in the standard fashion for a above-the-knee amputation.  I marked out the anterior and posterior flaps for a fish-mouth type of amputation.  I made the  incisions for these flaps, and then dissected through the subcutaneous tissue, fascia, and muscles circumferentially.  I elevated  the periosteal tissue 4-5 cm more proximal than the anterior skin flap.  I then transected the femur with a power saw at this level.  Then I smoothed out the rough edges of the bone.  At this point, the specimen was passed off the field as the above-the-knee amputation.  At this point, I clamped all visibly bleeding arteries and veins using a combination of suture ligation with silk suture and electrocautery.   Bleeding continued to be controlled with electrocautery and suture ligature.  The stump was washed off with sterile normal saline and no further active bleeding was noted.  I reapproximated the anterior and posterior fascia  with interrupted stitches of 0 Vicryl.  This was completed along the entire length of anterior and posterior fascia until there were no more loose space in the fascial line. The subcutaneous tissue was then approximated with 2-0 vicryl sutures. The skin was then  reapproximated with staples.  The stump was washed off and dried.  The incision was dressed with Xeroform and ABD pads, and  then fluffs were applied.  Kerlix was wrapped around the leg and then gently an ACE wrap was applied.  A large Ioban was then placed over the ACE wrap to secure the dressing. The patient was then awakened and take to the recovery room in stable condition.   COMPLICATIONS: none  CONDITION: stable  Leotis Pain  03/13/2022, 9:05 AM   This note was created with Dragon Medical transcription system. Any errors in dictation are purely unintentional.

## 2022-03-13 NOTE — Progress Notes (Signed)
   Date of Admission:  03/05/2022      ID: Adam Howell is a 61 y.o. male Principal Problem:   Wound infection of chronic right lower extremity ulcer Active Problems:   Anemia in chronic kidney disease   ESRD on dialysis ([place])   Obesity (BMI 30-39.9)   Type 2 diabetes mellitus with ESRD (end-stage renal disease) ([place])   Chronic atrial fibrillation with RVR (HCC)   Hyperlipidemia associated with type 2 diabetes mellitus (HCC)   Chronic systolic CHF (congestive heart failure) (HCC)   Hypokalemia   Hyponatremia   Acute metabolic encephalopathy   Hypotension   Uncontrolled type 2 diabetes mellitus with hypoglycemia, with long-term current use of insulin (HCC)    Subjective: Pt in dialysis He had RT aka today after he consented to it   Medications:   amiodarone  200 mg Oral BID   apixaban  2.5 mg Oral BID   bumetanide  1 mg Oral BID   Chlorhexidine Gluconate Cloth  6 each Topical Q0600   fentaNYL       multivitamin  1 tablet Oral QHS   rosuvastatin  40 mg Oral Daily    Objective: Vital signs in last 24 hours: Temp:  [97.1 F (36.2 C)-99.2 F (37.3 C)] 98.6 F (37 C) (12/29 1330) Pulse Rate:  [76-102] 94 (12/29 1500) Resp:  [12-26] 19 (12/29 1500) BP: (111-302)/(72-115) 169/93 (12/29 1500) SpO2:  [95 %-100 %] 100 % (12/29 1500)   PHYSICAL EXAM:  General: Awake, alert, some confusion Lungs: b/l air entry Heart: irregular. Abdomen: Soft, non-tender,not distended. Bowel sounds normal. No masses Extremities: rt leg Aka- surgical site covered with dressing Lymph: Cervical, supraclavicular normal. Neurologic: Grossly non-focal  Lab Results Recent Labs    03/10/22 1706 03/12/22 0540  WBC 7.9 7.9  HGB 10.0* 10.3*  HCT 31.5* 32.2*  NA 133* 136  K 5.2* 4.6  CL 93* 96*  CO2 25 27  BUN 53* 37*  CREATININE 12.03* 8.59*   Liver Panel Recent Labs    03/12/22 0540  PROT 7.1  ALBUMIN 2.5*  AST 30  ALT 12  ALKPHOS 22  BILITOT 1.3*   Microbiology: Wound culture- polymicrobial with proteus, citrobacter and VRE    Assessment/Plan:  Altered mental status due to Acute metabolic encephalopathy- much improved-combination of [place] encephalopathy, not getting dialysis for 5 days, opioid accumulation in the system along with other CNS depressant drugs ( ativan, gabapentin), Co2 retention due to underlying OSA Pt is much better today and able to give consent for Rt AKA - 3 rd day of continuous  dialysis  No seizures  Non healing infected  wound  of the rt shin - with tendon and bone exposed Multiple organisms cultured-  cultured in the wound proteus VRE and Citrobacter-on meropenem and  linezolid Underwent AKA today because of uncontrolled infection and no chance of healing. Can stop meropenem and linezolid in 48 hrs  OSA-need to continue BIPAP  ESRD  Afib on amiodarone  ID will follow him peripherally this weekend- call if needed

## 2022-03-13 NOTE — Progress Notes (Signed)
SLP Cancellation Note  Patient Details Name: Adam Howell MRN: [id number] DOB: 15-Jul-1960   Cancelled treatment:       Reason Eval/Treat Not Completed: Patient at procedure or test/unavailable  Pt currently in OR for below the knee amputation.   Will follow up as appropriate.   Rishit Burkhalter B. Rutherford Nail, M.S., CCC-SLP, [place] Pathologist Certified Brain Injury [place]  [facility] [phone] Ascom [phone] Fax [phone]  Stormy Fabian 03/13/2022, 9:31 AM

## 2022-03-13 NOTE — Progress Notes (Signed)
[facility], [place] 03/13/22  Subjective:   LOS: 8  Patient known to our practice from previous admissions.  Patient admitted for worsening right lower extremity ulcer.  Currently getting IV broad-spectrum antibiotics.   Patient underwent irrigation and debridement of right lower extremity and placement of wound VAC.  Patient seen and evaluated during dialysis   HEMODIALYSIS FLOWSHEET:  Blood Flow Rate (mL/min): 400 mL/min Arterial Pressure (mmHg): -160 mmHg Venous Pressure (mmHg): 280 mmHg TMP (mmHg): -8 mmHg Ultrafiltration Rate (mL/min): 86 mL/min Dialysate Flow Rate (mL/min): 300 ml/min Dialysis Fluid Bolus: Normal Saline Bolus Amount (mL): 200 mL  Somnolent but arouseable.  Complains of right leg pain, given pain medication prior to HD  Objective:  Vital signs in last 24 hours:  Temp:  [97.1 F (36.2 C)-99.2 F (37.3 C)] 98.6 F (37 C) (12/29 1330) Pulse Rate:  [76-102] 98 (12/29 1400) Resp:  [12-26] 12 (12/29 1400) BP: (111-209)/(63-115) 209/96 (12/29 1400) SpO2:  [95 %-100 %] 99 % (12/29 1400)  Weight change: 0.3 kg Filed Weights   03/11/22 1422 03/12/22 0752 03/12/22 1109  Weight: 111.6 kg 111.9 kg 111.9 kg    Intake/Output:    Intake/Output Summary (Last 24 hours) at 03/13/2022 1409 Last data filed at 03/13/2022 4481 Gross per 24 hour  Intake 523.37 ml  Output 200 ml  Net 323.37 ml      Physical Exam: General: Chronically ill-appearing, laying in the bed  HEENT Moist oral mucous membranes  Pulm/lungs Normal breathing effort  CVS/Heart No rub  Abdomen:  Soft, nontender, nondistended  Extremities: Rt AKA, no edema  Neurologic: Somnolent but arouseable.   Skin: No acute rashes  Access: AV fistula       Basic Metabolic Panel:  Recent Labs  Lab 03/07/22 0736 03/08/22 0915 03/10/22 1706 03/12/22 0540  NA 134* 135 133* 136  K 4.4 4.1 5.2* 4.6  CL 94* 95* 93* 96*  CO2 30 26 25 27   GLUCOSE 108* 103* 78 88   BUN 24* 35* 53* 37*  CREATININE 6.60* 8.74* 12.03* 8.59*  CALCIUM 8.2* 8.4* 8.6* 8.4*  PHOS  --  6.1*  --   --       CBC: Recent Labs  Lab 03/07/22 0736 03/08/22 0947 03/10/22 1706 03/12/22 0540  WBC 11.5* 11.8* 7.9 7.9  HGB 11.4* 10.1* 10.0* 10.3*  HCT 35.3* 33.0* 31.5* 32.2*  MCV 87.4 88.9 88.5 88.2  PLT 344 331 353 341       Lab Results  Component Value Date   HEPBSAG NON REACTIVE 03/06/2022      Microbiology:  Recent Results (from the past 240 hour(s))  Culture, blood (routine x 2)     Status: None   Collection Time: 03/05/22  7:43 PM   Specimen: BLOOD  Result Value Ref Range Status   Specimen Description BLOOD BLOOD RIGHT ARM  Final   Special Requests   Final    BOTTLES DRAWN AEROBIC AND ANAEROBIC Blood Culture results may not be optimal due to an inadequate volume of blood received in culture bottles   Culture   Final    NO GROWTH 5 DAYS Performed at [facility], [address], [place], [place] [address]    Report Status 03/10/2022 FINAL  Final  Surgical PCR screen     Status: None   Collection Time: 03/06/22 11:31 PM   Specimen: Nasal Mucosa; Nasal Swab  Result Value Ref Range Status   MRSA, PCR NEGATIVE NEGATIVE Final   Staphylococcus  aureus NEGATIVE NEGATIVE Final    Comment: (NOTE) The Xpert SA Assay (FDA approved for NASAL specimens in patients 73 years of age and older), is one component of a comprehensive surveillance program. It is not intended to diagnose infection nor to guide or monitor treatment. Performed at [facility], [address] [place], [place] [address]   Aerobic/Anaerobic Culture w Gram Stain (surgical/deep wound)     Status: None   Collection Time: 03/07/22  9:50 AM   Specimen: Wound  Result Value Ref Range Status   Specimen Description   Final    WOUND Performed at [facility], [address], [place], [place] [address]    Special Requests   Final    right lower extremity  wound Performed at [facility], [address] [place], [place] [address]    Gram Stain   Final    FEW GRAM NEGATIVE RODS RARE WBC PRESENT, PREDOMINANTLY PMN    Culture   Final    FEW CITROBACTER KOSERI FEW ENTEROCOCCUS FAECIUM FEW PROTEUS MIRABILIS VANCOMYCIN RESISTANT ENTEROCOCCUS ISOLATED NO ANAEROBES ISOLATED Performed at [facility], [place] [address] [place], [place] [address]    Report Status 03/12/2022 FINAL  Final   Organism ID, Bacteria PROTEUS MIRABILIS  Final   Organism ID, Bacteria CITROBACTER KOSERI  Final   Organism ID, Bacteria ENTEROCOCCUS FAECIUM  Final      Susceptibility   Citrobacter koseri - MIC*    CEFAZOLIN 16 SENSITIVE Sensitive     CEFEPIME 0.5 SENSITIVE Sensitive     CEFTAZIDIME 2 SENSITIVE Sensitive     CEFTRIAXONE 0.5 SENSITIVE Sensitive     CIPROFLOXACIN <=0.25 SENSITIVE Sensitive     GENTAMICIN <=1 SENSITIVE Sensitive     IMIPENEM <=0.25 SENSITIVE Sensitive     TRIMETH/SULFA <=20 SENSITIVE Sensitive     PIP/TAZO 64 INTERMEDIATE Intermediate     * FEW CITROBACTER KOSERI   Enterococcus faecium - MIC*    AMPICILLIN >=32 RESISTANT Resistant     VANCOMYCIN >=32 RESISTANT Resistant     GENTAMICIN SYNERGY SENSITIVE Sensitive     LINEZOLID 2 SENSITIVE Sensitive     * FEW ENTEROCOCCUS FAECIUM   Proteus mirabilis - MIC*    AMPICILLIN <=2 SENSITIVE Sensitive     CEFAZOLIN 8 SENSITIVE Sensitive     CEFEPIME 0.25 SENSITIVE Sensitive     CEFTAZIDIME <=1 SENSITIVE Sensitive     CEFTRIAXONE 0.5 SENSITIVE Sensitive     CIPROFLOXACIN <=0.25 SENSITIVE Sensitive     GENTAMICIN <=1 SENSITIVE Sensitive     IMIPENEM 2 SENSITIVE Sensitive     TRIMETH/SULFA <=20 SENSITIVE Sensitive     AMPICILLIN/SULBACTAM <=2 SENSITIVE Sensitive     PIP/TAZO <=4 SENSITIVE Sensitive     * FEW PROTEUS MIRABILIS    Coagulation Studies: No results for input(s): "LABPROT", "INR" in the last 72 hours.   Urinalysis: No results for input(s): "COLORURINE",  "LABSPEC", "PHURINE", "GLUCOSEU", "HGBUR", "BILIRUBINUR", "KETONESUR", "PROTEINUR", "UROBILINOGEN", "NITRITE", "LEUKOCYTESUR" in the last 72 hours.  Invalid input(s): "APPERANCEUR"    Imaging: No results found.   Medications:    anticoagulant sodium citrate     dextrose 20 mL/hr at 03/12/22 0641   levETIRAcetam     linezolid (ZYVOX) IV 600 mg (03/12/22 2209)   meropenem (MERREM) IV 500 mg (03/12/22 1830)    amiodarone  200 mg Oral BID   apixaban  2.5 mg Oral BID   bumetanide  1 mg Oral BID   Chlorhexidine Gluconate Cloth  6  each Topical Q0600   fentaNYL       multivitamin  1 tablet Oral QHS   rosuvastatin  40 mg Oral Daily   acetaminophen **OR** acetaminophen, alteplase, anticoagulant sodium citrate, dextrose, fentaNYL, fentaNYL (SUBLIMAZE) injection, heparin, HYDROmorphone (DILAUDID) injection, lidocaine (PF), lidocaine-prilocaine, ondansetron **OR** ondansetron (ZOFRAN) IV, ondansetron (ZOFRAN) IV, oxyCODONE-acetaminophen, pentafluoroprop-tetrafluoroeth  Assessment/ Plan:  61 y.o. male with diabetes with complications, ESRD, atrial fibrillation requiring Eliquis, amiodarone, congestive heart failure, secondary hyperparathyroidism, chronic nonhealing right lower extremity wound was admitted on 03/05/2022 for  Principal Problem:   Wound infection of chronic right lower extremity ulcer Active Problems:   Anemia in chronic kidney disease   ESRD on dialysis (HCC)   Obesity (BMI 30-39.9)   Type 2 diabetes mellitus with ESRD (end-stage renal disease) (HCC)   Chronic atrial fibrillation with RVR (HCC)   Hyperlipidemia associated with type 2 diabetes mellitus (HCC)   Chronic systolic CHF (congestive heart failure) (HCC)   Hypokalemia   Hyponatremia   Acute metabolic encephalopathy   Hypotension   Uncontrolled type 2 diabetes mellitus with hypoglycemia, with long-term current use of insulin ([place])  Unspecified open wound, right lower leg, initial encounter [S81.801A] Necrotizing  fasciitis of lower leg ([place]) [M72.6]  #. ESRD [facility]/MWF/left aVF   Receiving dialysis today. UF increased to 1-1.5L due to hypertension.  Next treatment scheduled for Sunday, due to holiday schedule.  #. Anemia of CKD  Lab Results  Component Value Date   HGB 10.3 (L) 03/12/2022   No need for ESA's at this time.  #. Secondary hyperparathyroidism of renal origin N 25.81   No results found for: "PTH" Lab Results  Component Value Date   PHOS 6.1 (H) 03/08/2022   Will continue to monitor bone minerals during this admission.    #.  Nonhealing right lower extremity ulcer with wound infection -Broad-spectrum antibiotics, Zosyn -Irrigation and debridement with wound vac placement with undesirable results. Rt AKA completed on 03/13/22. Ensure renal dosing of narcotic and sedative medications.   # Acute metabolic encephalopathy.  MRI head suggestive of subacute infarct however neurology has disputed this finding.  EEG performed, suggestive of metabolic encephalopathy.  Arousable today. Would monitor narcotic regimen with recent procedure.    LOS: [place] 12/29/20232:10 [place], [place]

## 2022-03-13 NOTE — Interval H&P Note (Signed)
History and Physical Interval Note:  03/13/2022 7:31 AM  Adam Howell  has presented today for surgery, with the diagnosis of PVD.  The various methods of treatment have been discussed with the patient and family. After consideration of risks, benefits and other options for treatment, the patient has consented to  Procedure(s): AMPUTATION ABOVE KNEE (Right) as a surgical intervention.  The patient's history has been reviewed, patient examined, no change in status, stable for surgery.  I have reviewed the patient's chart and labs.  Questions were answered to the patient's satisfaction.     Leotis Pain   There has been lots of discussion with the patient who was originally resistant to surgery.  Family has discussed with patient and he is now agreeable to proceed with right AKA.  Understands that the leg is not salvageable and the alternative to AKA would be hospice.  Wants AKA at this point and is alert and oriented this morning.  Will proceed with right AKA.

## 2022-03-13 NOTE — OR Nursing (Signed)
Preop report from [facility], explained that blank consent had been signed by the pt's daughter as pt is confused "not mentally there". Explained consent wouldn't be valid and would need an order from Dr. Lucky Cowboy to obtain order for surgical consent. Floor called again, explained again that consent wouldn't be valid if not filled out. SDS Doroteo Bradford RN notified regarding consent concerns. Provided floor with MD contact pager and telephone number

## 2022-03-13 NOTE — Progress Notes (Signed)
Progress Note   Patient: Adam Howell WFU:[id number] DOB: Nov 01, 1960 DOA: 03/05/2022     8 DOS: the patient was seen and examined on 03/13/2022   Brief hospital course:  Adam Howell is a 61 y.o. male with medical history significant for diabetes mellitus with complications of end-stage renal disease on hemodialysis (M/W/F), history of A-fib on Eliquis and amiodarone, HFpEF (EF 35 to 40% 2021), secondary hyperparathyroidism with chronic nonhealing right lower extremity wound for which he was hospitalized for debridement and IV antibiotics from 10/31 to 11/6.  He was sent to the ED by his home health nurse due to concerns for increased drainage and foul odor from the wound in spite of ongoing dressings.  Patient also reports increased pain of the lower leg.   MRI right tib-fib suggesting myositis without osteomyelitis. Patient was started on Zosyn and vancomycin.  Patient was taken to the OR for debridement on 12/23.  Wound culture only grew out gram-negative rods, antibiotics switched to Zosyn only.  Patient had a prolonged episode of altered mental status postop, currently improved.  Had another episode of decreased responsiveness on 12/26.  Seen by neurology, most likely metabolic encephalopathy.  MRI of the brain reported as new stroke, neurology has reviewed it, does not believe this is stroke. 12/27.  Patient has dense encephalopathy, after discussion with neurology, ID, determined this is mainly metabolic in origin. 12/29.  Patient had right-sided above-knee amputation today.  Already waking up postop.      Assessment and Plan:  Acute metabolic encephalopathy. Hypotension. Patient had a prolonged altered mental status post op due to end-stage renal disease.  Blood pressure was low overnight, received fluid bolus.  Mental status finally improved. Patient had another episode of mental status change am of 12/26, he was confused, minimally responsive.  Code stroke was called,  seen by Dr. Cheral Marker, does not appear to have stroke, I reviewed the CT images together with exam, he appears to have some old stroke, no new stroke at this time.  MRI was also performed, reportedly a new stroke, but reviewed with Dr. Cheral Marker, does not appear to be stroke.  EEG is consistent with metabolic encephalopathy. Patient condition much improved today, postoperatively, patient already woke up.  No confusion.     Wound infection of chronic right lower extremity ulcer Blood culture negative. Patient had a debridement performed 12/23.  Gram stain shows gram negative rods, culture only has gram-negative rods.  Discontinue vancomycin, continue Zosyn for now. Wound culture still not finalized, appears to be largely microbial, but all gram-negative. Patient appears to have nonsalvageable right lower extremity with a severe wound.   Patient finally agreeable for amputation, he had above-knee amputation earlier this morning. Will continue antibiotics for additional 2 days.   Chronic systolic CHF (congestive heart failure) (HCC) Condition stable without volume overload.   Type 2 diabetes mellitus with ESRD (end-stage renal disease) (HCC) Hypoglycemia. Continue D10 drip, glucose more stable.   ESRD on dialysis [facility]) Patient does not have volume overload, followed by nephrology for scheduled dialysis.   Chronic Atrial fibrillation (HCC) Continue Eliquis and amiodarone   Anemia in chronic kidney disease Stable.   Hyperlipidemia associated with type 2 diabetes mellitus ([place]) Continue rosuvastatin   Obesity with BMI 31.87     Subjective:  Patient had a surgery this morning, going down for dialysis.  Mental status improved, no additional confusion.  Physical Exam: Vitals:   03/13/22 1330 03/13/22 1347 03/13/22 1400 03/13/22 1430  BP: (!) 165/115 Marland Kitchen)  194/95 (!) 209/96 (!) 302/97  Pulse: 100 93 98 98  Resp: (!) 23 18 12    Temp: 98.6 F (37 C)     TempSrc: Oral     SpO2: 100% 100%  99% 100%  Weight:      Height:       General exam: Appears calm and comfortable  Respiratory system: Clear to auscultation. Respiratory effort normal. Cardiovascular system: S1 & S2 heard, RRR. No JVD, murmurs, rubs, gallops or clicks. No pedal edema. Gastrointestinal system: Abdomen is nondistended, soft and nontender. No organomegaly or masses felt. Normal bowel sounds heard. Central nervous system: Alert and oriented. No focal neurological deficits. Extremities: Right BKA. Skin: No rashes, lesions or ulcers Psychiatry: Judgement and insight appear normal. Mood & affect appropriate.   Data Reviewed:  Lab results reviewed.  Family Communication: None  Disposition: Status is: Inpatient Remains inpatient appropriate because: Severity of disease, IV treatment.  Planned Discharge Destination:  TBD    Time spent: 35 minutes  Author: Sharen Hones, MD 03/13/2022 2:52 PM  For on call review [email or web address].

## 2022-03-13 NOTE — Progress Notes (Signed)
Patient tolerated 3.5  HD well with 1.5 uf without complication  48/62/82 4175  Vitals  Temp 98 F (36.7 C)  Temp Source Oral  BP (!) 179/92  MAP (mmHg) 112  BP Location Right Arm  BP Method Automatic  Patient Position (if appropriate) Lying  Pulse Rate (!) 105  Pulse Rate Source Monitor  ECG Heart Rate (!) 103  Resp (!) 23  Oxygen Therapy  SpO2 100 %  O2 Device Room Air  Patient Activity (if Appropriate) In bed  Pulse Oximetry Type Continuous  Oximetry Probe Site Changed No  Post Treatment  Dialyzer Clearance Clear  Duration of HD Treatment -hour(s) 3.5 hour(s)  Liters Processed 73.7  Fluid Removed (mL) 1500 mL  Tolerated HD Treatment Yes  Post-Hemodialysis Comments tolerated well  AVG/AVF Arterial Site Held (minutes) 8 minutes  AVG/AVF Venous Site Held (minutes) 8 minutes

## 2022-03-13 NOTE — Transfer of Care (Signed)
Immediate Anesthesia Transfer of Care Note  Patient: Adam Howell  Procedure(s) Performed: AMPUTATION ABOVE KNEE (Right: Knee)  Patient Location: PACU  Anesthesia Type:General  Level of Consciousness: drowsy  Airway & Oxygen Therapy: Patient Spontanous Breathing and Patient connected to face mask oxygen  Post-op Assessment: Report given to RN and Post -op Vital signs reviewed and stable  Post vital signs: Reviewed and stable  Last Vitals:  Vitals Value Taken Time  BP 149/72 03/13/22 0919  Temp    Pulse 79 03/13/22 0921  Resp 22 03/13/22 0921  SpO2 100 % 03/13/22 0921  Vitals shown include unvalidated device data.  Last Pain:  Vitals:   03/13/22 0707  TempSrc: Oral  PainSc: 0-No pain      Patients Stated Pain Goal: 0 (25/36/64 4034)  Complications: No notable events documented.

## 2022-03-13 NOTE — Anesthesia Postprocedure Evaluation (Signed)
Anesthesia Post Note  Patient: Adam Howell  Procedure(s) Performed: AMPUTATION ABOVE KNEE (Right: Knee)  Patient location during evaluation: PACU Anesthesia Type: General Level of consciousness: awake and alert, oriented and patient cooperative Pain management: pain level controlled Vital Signs Assessment: post-procedure vital signs reviewed and stable Respiratory status: spontaneous breathing, nonlabored ventilation and respiratory function stable Cardiovascular status: blood pressure returned to baseline and stable Postop Assessment: adequate PO intake Anesthetic complications: no   No notable events documented.   Last Vitals:  Vitals:   03/13/22 0930 03/13/22 1000  BP: (!) 147/86 (!) 156/85  Pulse:  91  Resp: (!) 26 (!) 25  Temp:    SpO2:  100%    Last Pain:  Vitals:   03/13/22 1000  TempSrc:   PainSc: [place]

## 2022-03-14 ENCOUNTER — Encounter: Payer: Self-pay | Admitting: Vascular Surgery

## 2022-03-14 DIAGNOSIS — G9341 Metabolic encephalopathy: Secondary | ICD-10-CM | POA: Diagnosis not present

## 2022-03-14 DIAGNOSIS — I5022 Chronic systolic (congestive) heart failure: Secondary | ICD-10-CM | POA: Diagnosis not present

## 2022-03-14 DIAGNOSIS — T148XXA Other injury of unspecified body region, initial encounter: Secondary | ICD-10-CM | POA: Diagnosis not present

## 2022-03-14 DIAGNOSIS — N186 End stage renal disease: Secondary | ICD-10-CM | POA: Diagnosis not present

## 2022-03-14 LAB — RENAL FUNCTION PANEL
Albumin: 2.3 g/dL — ABNORMAL LOW (ref 3.5–5.0)
Anion gap: 11 (ref 5–15)
BUN: 25 mg/dL — ABNORMAL HIGH (ref 6–20)
CO2: 27 mmol/L (ref 22–32)
Calcium: 8.3 mg/dL — ABNORMAL LOW (ref 8.9–10.3)
Chloride: 96 mmol/L — ABNORMAL LOW (ref 98–111)
Creatinine, Ser: 5.68 mg/dL — ABNORMAL HIGH (ref 0.61–1.24)
GFR, Estimated: 11 mL/min — ABNORMAL LOW (ref >60)
Glucose, Bld: 143 mg/dL — ABNORMAL HIGH (ref 70–99)
Phosphorus: 4.8 mg/dL — ABNORMAL HIGH (ref 2.5–4.6)
Potassium: 3.6 mmol/L (ref 3.5–5.1)
Sodium: 134 mmol/L — ABNORMAL LOW (ref 135–145)

## 2022-03-14 LAB — GLUCOSE, CAPILLARY
Glucose-Capillary: 157 mg/dL — ABNORMAL HIGH (ref 70–99)
Glucose-Capillary: 140 mg/dL — ABNORMAL HIGH (ref 70–99)
Glucose-Capillary: 155 mg/dL — ABNORMAL HIGH (ref 70–99)
Glucose-Capillary: 130 mg/dL — ABNORMAL HIGH (ref 70–99)

## 2022-03-14 LAB — CBC
HCT: 28.7 % — ABNORMAL LOW (ref 39.0–52.0)
Hemoglobin: 9.6 g/dL — ABNORMAL LOW (ref 13.0–17.0)
MCH: 28 pg (ref 26.0–34.0)
MCHC: 33.4 g/dL (ref 30.0–36.0)
MCV: 83.7 fL (ref 80.0–100.0)
Platelets: 354 10*3/uL (ref 150–400)
RBC: 3.43 MIL/uL — ABNORMAL LOW (ref 4.22–5.81)
RDW: 16.2 % — ABNORMAL HIGH (ref 11.5–15.5)
WBC: 13.3 10*3/uL — ABNORMAL HIGH (ref 4.0–10.5)
nRBC: 0 % (ref 0.0–0.2)

## 2022-03-14 MED ORDER — PENTAFLUOROPROP-TETRAFLUOROETH EX AERO
INHALATION_SPRAY | CUTANEOUS | Status: AC
  Filled 2022-03-14: qty 30

## 2022-03-14 MED ORDER — ENSURE ENLIVE PO LIQD
237.0000 mL | Freq: Two times a day (BID) | ORAL | Status: DC
  Administered 2022-03-14 – 2022-03-16 (×3): 237 mL via ORAL

## 2022-03-14 NOTE — Evaluation (Signed)
Occupational Therapy Evaluation Patient Details Name: Adam Howell MRN: [id number] DOB: 30-Dec-1960 Today's Date: 03/14/2022   History of Present Illness Pt is a 61 year old male presenting ED by his home health nurse due to concerns for increased drainage and foul odor from the wound in spite of ongoing dressings; hospital course complicated by dense encephalopathy; s/p R AKA 12/29; PMH significant for diabetes mellitus with complications of end-stage renal disease on hemodialysis (M/W/F), history of A-fib on Eliquis and amiodarone, HFpEF (EF 35 to 40% 2021), secondary hyperparathyroidism   Clinical Impression   Chart reviewed, RN cleared pt for participation in OT evaluation. Pt is greeted in bed, oriented to self, place, situation; not oriented to date; increased time for processing, one step direction following. PTA pt was generally MOD I for ADL per report, some difficulties amb due to wound, daughter lives with pt and can assist per pt report. Pt presents with deficits in strength, endurance,activity tolerance, balance, cognition affecting safe and optimal ADL completion. Bed mobility completed with MIN A, STS with MOD A with RW with bed raised, pt able to stand for approx 45 seconds before requiring seated rest break. Vascular MD in room therefore pt returned to supine. Pt performs boost in bed with MOD I with use of bed features. SET UP required for grooming tasks, MAX A for LB dressing tasks. Brief education provided re: residual limb care. Recommend discharge to intensive therapy to facilitate return to PLOF, optimal ADL indep. OT will continue to follow acutely.      Recommendations for follow up therapy are one component of a multi-disciplinary discharge planning process, led by the attending physician.  Recommendations may be updated based on patient status, additional functional criteria and insurance authorization.   Follow Up Recommendations  Acute inpatient rehab (3hours/day)      Assistance Recommended at Discharge Intermittent Supervision/Assistance  Patient can return home with the following A lot of help with walking and/or transfers;A lot of help with bathing/dressing/bathroom    Functional Status Assessment  Patient has had a recent decline in their functional status and demonstrates the ability to make significant improvements in function in a reasonable and predictable amount of time.  Equipment Recommendations  Other (comment) (per next venue of care)    Recommendations for Other Services       Precautions / Restrictions Precautions Precautions: Fall Precaution Comments: s/p R BKA Restrictions Weight Bearing Restrictions: Yes      Mobility Bed Mobility Overal bed mobility: Needs Assistance Bed Mobility: Supine to Sit, Sit to Supine     Supine to sit: Min assist, HOB elevated Sit to supine: HOB elevated, Mod assist   General bed mobility comments: management of LLE    Transfers Overall transfer level: Needs assistance Equipment used: Rolling walker (2 wheels) Transfers: Sit to/from Stand Sit to Stand: Mod assist, From elevated surface           General transfer comment: multiple attempts, step by step vcs for technique      Balance Overall balance assessment: Needs assistance Sitting-balance support: Single extremity supported Sitting balance-Leahy Scale: Good Sitting balance - Comments: for seated ADLs   Standing balance support: Bilateral upper extremity supported, During functional activity, Reliant on assistive device for balance Standing balance-Leahy Scale: Fair Standing balance comment: static standing with RW for approx 45 seconds                           ADL either  performed or assessed with clinical judgement   ADL Overall ADL's : Needs assistance/impaired     Grooming: Wash/dry hands;Sitting;Set up           Upper Body Dressing : Moderate assistance;Sitting   Lower Body Dressing: Maximal  assistance       Toileting- Clothing Manipulation and Hygiene: Maximal assistance               Vision Patient Visual Report: No change from baseline       Perception     Praxis      Pertinent Vitals/Pain Pain Assessment Pain Assessment: 0-10 Pain Score: 10-Worst pain ever Pain Descriptors / Indicators: Aching (ace wrap feels "tight") Pain Intervention(s): Limited activity within patient's tolerance, Monitored during session, Premedicated before session     Hand Dominance Right   Extremity/Trunk Assessment Upper Extremity Assessment Upper Extremity Assessment:  (generally 4/5 throughout)   Lower Extremity Assessment Lower Extremity Assessment: RLE deficits/detail RLE Deficits / Details: new R AKA       Communication Communication Communication: No difficulties   Cognition Arousal/Alertness: Awake/alert Behavior During Therapy: WFL for tasks assessed/performed Overall Cognitive Status: No family/caregiver present to determine baseline cognitive functioning Area of Impairment: Orientation, Following commands, Safety/judgement, Awareness, Problem solving                 Orientation Level: Disoriented to, Time     Following Commands: Follows one step commands with increased time Safety/Judgement: Decreased awareness of deficits Awareness: Emergent Problem Solving: Slow processing, Requires verbal cues, Requires tactile cues       General Comments  pt reports dizziness with upright positioning, improves with PLB, no changes with STS; vitals monitored, appear stable throughout    Exercises Other Exercises Other Exercises: edu re: role of OT, role of rehab, discharge recommendations, home safety, falls prevention   Shoulder Instructions      Home Living Family/patient expects to be discharged to:: Private residence Living Arrangements: Other relatives (daughter) Available Help at Discharge: Family;Available 24 hours/day Type of Home:  Apartment Home Access: Level entry     Home Layout: One level     Bathroom Shower/Tub: Teacher, early years/pre: Handicapped height     Home Equipment: Conservation officer, nature (2 wheels)          Prior Functioning/Environment Prior Level of Function : Independent/Modified Independent               ADLs Comments: generally MOD I-I in ADL, some assist for IADLs        OT Problem List: Decreased strength;Decreased activity tolerance;Decreased safety awareness;Decreased knowledge of precautions;Decreased knowledge of use of DME or AE      OT Treatment/Interventions: Self-care/ADL training;DME and/or AE instruction;Therapeutic activities;Balance training;Therapeutic exercise;Energy conservation;Patient/family education    OT Goals(Current goals can be found in the care plan section) Acute Rehab OT Goals Patient Stated Goal: go to rehab, return to PLOF OT Goal Formulation: With patient Time For Goal Achievement: 03/28/22 Potential to Achieve Goals: Good ADL Goals Pt Will Perform Grooming: with modified independence Pt Will Perform Lower Body Dressing: with modified independence Pt Will Transfer to Toilet: with modified independence Pt Will Perform Toileting - Clothing Manipulation and hygiene: with modified independence Pt/caregiver will Perform Home Exercise Program: Increased strength;Right Upper extremity;Left upper extremity;With written HEP provided  OT Frequency: Min 3X/week    Co-evaluation              AM-PAC OT "6 Clicks" Daily Activity     Outcome  Measure Help from another person eating meals?: None Help from another person taking care of personal grooming?: A Little Help from another person toileting, which includes using toliet, bedpan, or urinal?: A Lot Help from another person bathing (including washing, rinsing, drying)?: A Lot Help from another person to put on and taking off regular upper body clothing?: A Little Help from another person to  put on and taking off regular lower body clothing?: A Lot 6 Click Score: 16   End of Session Equipment Utilized During Treatment: Rolling walker (2 wheels) Nurse Communication: Mobility status  Activity Tolerance: Patient tolerated treatment well Patient left: in bed;with call bell/phone within reach;with bed alarm set (vascular MD present, RN present)  OT Visit Diagnosis: Unsteadiness on feet (R26.81);Muscle weakness (generalized) (M62.81)                Time: 0076-2263 OT Time Calculation (min): 21 min Charges:  OT General Charges $OT Visit: 1 Visit OT Evaluation $OT Eval Moderate Complexity: 1 Mod  Shanon Payor, OTD OTR/L  03/14/22, 3:32 PM

## 2022-03-14 NOTE — Progress Notes (Signed)
[facility], [place] 03/14/22  Subjective:   LOS: 9  Patient known to our practice from previous admissions.  Patient admitted for worsening right lower extremity ulcer.  Currently getting IV broad-spectrum antibiotics.   Patient underwent irrigation and debridement of right lower extremity and placement of wound VAC.  Patient seen and evaluated during dialysis   HEMODIALYSIS FLOWSHEET:  Blood Flow Rate (mL/min): 300 mL/min Arterial Pressure (mmHg): -70 mmHg Venous Pressure (mmHg): 200 mmHg TMP (mmHg): 8 mmHg Ultrafiltration Rate (mL/min): 686 mL/min Dialysate Flow Rate (mL/min): 300 ml/min Dialysis Fluid Bolus: Normal Saline Bolus Amount (mL): 200 mL  Tolerated treatment well yesterday. Due to recent surgery and anesthesia, treatment today is appropriate.   Objective:  Vital signs in last 24 hours:  Temp:  [98 F (36.7 C)-99.2 F (37.3 C)] 98.5 F (36.9 C) (12/30 0830) Pulse Rate:  [92-107] 100 (12/30 1030) Resp:  [11-26] 20 (12/30 1030) BP: (139-302)/(75-115) 172/91 (12/30 1030) SpO2:  [94 %-100 %] 99 % (12/30 1030) Weight:  [103.8 kg-112.6 kg] 103.8 kg (12/30 0830)  Weight change: -7.6 kg Filed Weights   03/13/22 1738 03/14/22 0500 03/14/22 0830  Weight: 104.3 kg 112.6 kg 103.8 kg    Intake/Output:    Intake/Output Summary (Last 24 hours) at 03/14/2022 1047 Last data filed at 03/14/2022 0546 Gross per 24 hour  Intake 441.27 ml  Output 1500 ml  Net -1058.73 ml      Physical Exam: General: Chronically ill-appearing, laying in the bed  HEENT Moist oral mucous membranes  Pulm/lungs Normal breathing effort  CVS/Heart No rub  Abdomen:  Soft, nontender, nondistended  Extremities: Rt AKA, no edema  Neurologic: Somnolent but arouseable.   Skin: No acute rashes  Access: AV fistula       Basic Metabolic Panel:  Recent Labs  Lab 03/08/22 0915 03/10/22 1706 03/12/22 0540 03/14/22 0849  NA 135 133* 136 134*  K 4.1 5.2*  4.6 3.6  CL 95* 93* 96* 96*  CO2 26 25 27 27   GLUCOSE 103* 78 88 143*  BUN 35* 53* 37* 25*  CREATININE 8.74* 12.03* 8.59* 5.68*  CALCIUM 8.4* 8.6* 8.4* 8.3*  PHOS 6.1*  --   --  4.8*      CBC: Recent Labs  Lab 03/08/22 0947 03/10/22 1706 03/12/22 0540 03/14/22 0849  WBC 11.8* 7.9 7.9 13.3*  HGB 10.1* 10.0* 10.3* 9.6*  HCT 33.0* 31.5* 32.2* 28.7*  MCV 88.9 88.5 88.2 83.7  PLT 331 353 341 354       Lab Results  Component Value Date   HEPBSAG NON REACTIVE 03/06/2022      Microbiology:  Recent Results (from the past 240 hour(s))  Culture, blood (routine x 2)     Status: None   Collection Time: 03/05/22  7:43 PM   Specimen: BLOOD  Result Value Ref Range Status   Specimen Description BLOOD BLOOD RIGHT ARM  Final   Special Requests   Final    BOTTLES DRAWN AEROBIC AND ANAEROBIC Blood Culture results may not be optimal due to an inadequate volume of blood received in culture bottles   Culture   Final    NO GROWTH 5 DAYS Performed at [facility], [address] [place], [place] [address]    Report Status 03/10/2022 FINAL  Final  Surgical PCR screen     Status: None   Collection Time: 03/06/22 11:31 PM   Specimen: Nasal Mucosa; Nasal Swab  Result Value Ref Range Status   MRSA,  PCR NEGATIVE NEGATIVE Final   Staphylococcus aureus NEGATIVE NEGATIVE Final    Comment: (NOTE) The Xpert SA Assay (FDA approved for NASAL specimens in patients 24 years of age and older), is one component of a comprehensive surveillance program. It is not intended to diagnose infection nor to guide or monitor treatment. Performed at [facility], [address] [place], [place] [address]   Aerobic/Anaerobic Culture w Gram Stain (surgical/deep wound)     Status: None   Collection Time: 03/07/22  9:50 AM   Specimen: Wound  Result Value Ref Range Status   Specimen Description   Final    WOUND Performed at [facility], [address] [place], [place]  [address]    Special Requests   Final    right lower extremity wound Performed at [facility], [address] [place], [place] [address]    Gram Stain   Final    FEW GRAM NEGATIVE RODS RARE WBC PRESENT, PREDOMINANTLY PMN    Culture   Final    FEW CITROBACTER KOSERI FEW ENTEROCOCCUS FAECIUM FEW PROTEUS MIRABILIS VANCOMYCIN RESISTANT ENTEROCOCCUS ISOLATED NO ANAEROBES ISOLATED Performed at [facility], [place] [address] [place], [place] [address]    Report Status 03/12/2022 FINAL  Final   Organism ID, Bacteria PROTEUS MIRABILIS  Final   Organism ID, Bacteria CITROBACTER KOSERI  Final   Organism ID, Bacteria ENTEROCOCCUS FAECIUM  Final      Susceptibility   Citrobacter koseri - MIC*    CEFAZOLIN 16 SENSITIVE Sensitive     CEFEPIME 0.5 SENSITIVE Sensitive     CEFTAZIDIME 2 SENSITIVE Sensitive     CEFTRIAXONE 0.5 SENSITIVE Sensitive     CIPROFLOXACIN <=0.25 SENSITIVE Sensitive     GENTAMICIN <=1 SENSITIVE Sensitive     IMIPENEM <=0.25 SENSITIVE Sensitive     TRIMETH/SULFA <=20 SENSITIVE Sensitive     PIP/TAZO 64 INTERMEDIATE Intermediate     * FEW CITROBACTER KOSERI   Enterococcus faecium - MIC*    AMPICILLIN >=32 RESISTANT Resistant     VANCOMYCIN >=32 RESISTANT Resistant     GENTAMICIN SYNERGY SENSITIVE Sensitive     LINEZOLID 2 SENSITIVE Sensitive     * FEW ENTEROCOCCUS FAECIUM   Proteus mirabilis - MIC*    AMPICILLIN <=2 SENSITIVE Sensitive     CEFAZOLIN 8 SENSITIVE Sensitive     CEFEPIME 0.25 SENSITIVE Sensitive     CEFTAZIDIME <=1 SENSITIVE Sensitive     CEFTRIAXONE 0.5 SENSITIVE Sensitive     CIPROFLOXACIN <=0.25 SENSITIVE Sensitive     GENTAMICIN <=1 SENSITIVE Sensitive     IMIPENEM 2 SENSITIVE Sensitive     TRIMETH/SULFA <=20 SENSITIVE Sensitive     AMPICILLIN/SULBACTAM <=2 SENSITIVE Sensitive     PIP/TAZO <=4 SENSITIVE Sensitive     * FEW PROTEUS MIRABILIS    Coagulation Studies: No results for input(s): "LABPROT", "INR" in the last 72  hours.   Urinalysis: No results for input(s): "COLORURINE", "LABSPEC", "PHURINE", "GLUCOSEU", "HGBUR", "BILIRUBINUR", "KETONESUR", "PROTEINUR", "UROBILINOGEN", "NITRITE", "LEUKOCYTESUR" in the last 72 hours.  Invalid input(s): "APPERANCEUR"    Imaging: No results found.   Medications:    anticoagulant sodium citrate     dextrose 20 mL/hr at 03/14/22 1014   levETIRAcetam     linezolid (ZYVOX) IV Stopped (03/13/22 2247)   meropenem (MERREM) IV 500 mg (03/13/22 1846)    amiodarone  200 mg Oral BID   apixaban  2.5 mg Oral BID   bumetanide  1 mg Oral BID  Chlorhexidine Gluconate Cloth  6 each Topical Q0600   multivitamin  1 tablet Oral QHS   rosuvastatin  40 mg Oral Daily   acetaminophen **OR** acetaminophen, alteplase, anticoagulant sodium citrate, dextrose, fentaNYL (SUBLIMAZE) injection, heparin, HYDROmorphone (DILAUDID) injection, lidocaine (PF), lidocaine-prilocaine, ondansetron **OR** ondansetron (ZOFRAN) IV, ondansetron (ZOFRAN) IV, oxyCODONE-acetaminophen, pentafluoroprop-tetrafluoroeth  Assessment/ Plan:  61 y.o. male with diabetes with complications, ESRD, atrial fibrillation requiring Eliquis, amiodarone, congestive heart failure, secondary hyperparathyroidism, chronic nonhealing right lower extremity wound was admitted on 03/05/2022 for  Principal Problem:   Wound infection of chronic right lower extremity ulcer Active Problems:   Anemia in chronic kidney disease   ESRD on dialysis (HCC)   Obesity (BMI 30-39.9)   Type 2 diabetes mellitus with ESRD (end-stage renal disease) (HCC)   Chronic atrial fibrillation with RVR (HCC)   Hyperlipidemia associated with type 2 diabetes mellitus (HCC)   Chronic systolic CHF (congestive heart failure) (HCC)   Hypokalemia   Hyponatremia   Acute metabolic encephalopathy   Hypotension   Uncontrolled type 2 diabetes mellitus with hypoglycemia, with long-term current use of insulin ([place])  Unspecified open wound, right lower leg,  initial encounter [S81.801A] Necrotizing fasciitis of lower leg ([place]) [M72.6]  #. ESRD [facility]/MWF/left aVF   Dialysis received yesterday, UF 1.5L achieved. Due to anesthesia from surgical procedure yesterday and somnolence earlier during the week. Will dialyze today for additional clearance of sedative medications.   #. Anemia of CKD  Lab Results  Component Value Date   HGB 9.6 (L) 03/14/2022   Hemoglobin acceptable for this patient.   #. Secondary hyperparathyroidism of renal origin N 25.81   No results found for: "PTH" Lab Results  Component Value Date   PHOS 4.8 (H) 03/14/2022    Calcium and phosphorus within desired range. Will continue to monitor.   #.  Nonhealing right lower extremity ulcer with wound infection -Broad-spectrum antibiotics, Zosyn -Irrigation and debridement with wound vac placement with undesirable results. Rt AKA completed on 03/13/22. Vascular continues to follow  # Acute metabolic encephalopathy.  MRI head suggestive of subacute infarct however neurology has disputed this finding.  EEG performed, suggestive of metabolic encephalopathy.  Mentation improving.    LOS: [place] 12/30/202310:47 [place], [place]

## 2022-03-14 NOTE — Progress Notes (Signed)
Pre hd rn assessment 

## 2022-03-14 NOTE — Progress Notes (Signed)
Patient did 3.5 hours of treatment, tolerated well.  Patient is complaining of pain on post operative site. 10/10 post HD. Hand off given to floor RN and notified of patient's pain level.   UF: 1.5L    03/14/22 1246  Neurological  Level of Consciousness Alert  Orientation Level Oriented X4  Respiratory  Respiratory Pattern Regular;Unlabored  Chest Assessment Chest expansion symmetrical  Bilateral Breath Sounds Diminished  Cardiac  Pulse Irregular  Heart Sounds S1, S2  Jugular Venous Distention (JVD) No  ECG Monitor Yes  Cardiac Rhythm Atrial fibrillation  Antiarrhythmic device No  Vascular  R Radial Pulse +2  L Radial Pulse +2  R Dorsalis Pedis Pulse  (right AKA)  L Dorsalis Pedis Pulse +1  Edema Generalized  Generalized Edema  (non pitting)  Integumentary  Integumentary (WDL) X  Skin Color Appropriate for ethnicity  Skin Condition Dry;Flaky  Skin Integrity Intact (podt Right AKA)  Musculoskeletal  Musculoskeletal (WDL) X  Generalized Weakness Yes  Gastrointestinal  Bowel Sounds Assessment Active  Last BM Date  03/14/22  GU Assessment  Genitourinary (WDL) X  Genitourinary Symptoms Oliguria  Psychosocial  Psychosocial (WDL) WDL  Patient Behaviors Appropriate for situation  Needs Expressed Denies  Emotional support given Given to patient

## 2022-03-14 NOTE — Progress Notes (Addendum)
Progress Note   Patient: Adam Howell IEP:[id number] DOB: 01/21/1961 DOA: 03/05/2022     9 DOS: the patient was seen and examined on 03/14/2022   Brief hospital course:  Adam Howell is a 61 y.o. male with medical history significant for diabetes mellitus with complications of end-stage renal disease on hemodialysis (M/W/F), history of A-fib on Eliquis and amiodarone, HFpEF (EF 35 to 40% 2021), secondary hyperparathyroidism with chronic nonhealing right lower extremity wound for which he was hospitalized for debridement and IV antibiotics from 10/31 to 11/6.  He was sent to the ED by his home health nurse due to concerns for increased drainage and foul odor from the wound in spite of ongoing dressings.  Patient also reports increased pain of the lower leg.   MRI right tib-fib suggesting myositis without osteomyelitis. Patient was started on Zosyn and vancomycin.  Patient was taken to the OR for debridement on 12/23.  Wound culture only grew out gram-negative rods, antibiotics switched to Zosyn only.  Patient had a prolonged episode of altered mental status postop, currently improved.  Had another episode of decreased responsiveness on 12/26.  Seen by neurology, most likely metabolic encephalopathy.  MRI of the brain reported as new stroke, neurology has reviewed it, does not believe this is stroke. 12/27.  Patient has dense encephalopathy, after discussion with neurology, ID, determined this is mainly metabolic in origin. 12/29.  Patient had right-sided above-knee amputation today.  Already waking up postop.      Assessment and Plan:  Acute metabolic encephalopathy. Hypotension. Patient had a prolonged altered mental status post op due to end-stage renal disease.  Blood pressure was low overnight, received fluid bolus.  Mental status finally improved. Patient had another episode of mental status change am of 12/26, he was confused, minimally responsive.  Code stroke was called,  seen by Dr. Cheral Marker, does not appear to have stroke, I reviewed the CT images together with exam, he appears to have some old stroke, no new stroke at this time.  MRI was also performed, reportedly a new stroke, but reviewed with Dr. Cheral Marker, does not appear to be stroke.  EEG is consistent with metabolic encephalopathy. Condition improved     Wound infection of chronic right lower extremity ulcer Blood culture negative. Patient had a debridement performed 12/23.  Gram stain shows gram negative rods, culture only has gram-negative rods.  Discontinue vancomycin, continue Zosyn for now. Wound culture still not finalized, appears to be largely microbial, but all gram-negative. Patient appears to have nonsalvageable right lower extremity with a severe wound.   Patient finally agreeable for amputation, he had above-knee amputation.  The wound culture finalized with Proteus mirabilis, Citrobacter.  And Enterococcus faecium. Antibiotics will be continued tomorrow. I have spoke with RN to perform a bedside swallow eval, then we will start renal diet in addition to protein supplements.   Start PT/OT.   Chronic systolic CHF (congestive heart failure) (HCC) Condition stable without volume overload.   Type 2 diabetes mellitus with ESRD (end-stage renal disease) (HCC) Hypoglycemia. Continue D10 drip, glucose more stable.   ESRD on dialysis [facility]) Patient does not have volume overload, followed by nephrology for scheduled dialysis.   Chronic Atrial fibrillation (HCC) Continue Eliquis and amiodarone   Anemia in chronic kidney disease Stable.   Hyperlipidemia associated with type 2 diabetes mellitus ([place]) Continue rosuvastatin   Obesity with BMI 31.87     Subjective:  Patient doing well today, no confusion.  Feels hungry.  Physical Exam:  Vitals:   03/14/22 0951 03/14/22 1000 03/14/22 1030 03/14/22 1100  BP:  (!) 169/86 (!) 172/91 (!) 181/84  Pulse: (!) 106 (!) 103 100 97  Resp: 13 15 20  18   Temp:      TempSrc:      SpO2:  94% 99% 99%  Weight:      Height:       General exam: Appears calm and comfortable  Respiratory system: Clear to auscultation. Respiratory effort normal. Cardiovascular system: irregular. No JVD, murmurs, rubs, gallops or clicks. No pedal edema. Gastrointestinal system: Abdomen is nondistended, soft and nontender. No organomegaly or masses felt. Normal bowel sounds heard. Central nervous system: Alert and oriented. No focal neurological deficits. Extremities: Right AKA. Skin: No rashes, lesions or ulcers Psychiatry: Judgement and insight appear normal. Mood & affect appropriate.   Data Reviewed:  Lab results reviewed.  Family Communication: None  Disposition: Status is: Inpatient Remains inpatient appropriate because: Severity of disease, IV treatment.  Planned Discharge Destination:  TBD    Time spent: 35 minutes  Author: Sharen Hones, MD 03/14/2022 11:33 AM  For on call review [email or web address].

## 2022-03-14 NOTE — Plan of Care (Signed)

## 2022-03-14 NOTE — Progress Notes (Signed)
BFR decreased to 350 due to patient alarms. Pt raising access arm above head to stretch. Reminded patient to keep access arm immobile during HD. Pt verbalized understanding. Resting, vss.

## 2022-03-14 NOTE — Progress Notes (Signed)
RN called about pt stating his Bipap was not blowing correctly. Pt told me that the mask was not on his face right but was awake for today and did not want to put it back on.

## 2022-03-14 NOTE — Progress Notes (Signed)
Pt acid bath changed to 4k+ per Holley Raring, MD.

## 2022-03-14 NOTE — Progress Notes (Signed)
Subjective  - POD #1, s/p right AKA  Complaining of pain at amputation site, feels that the dressing is too tight   Physical Exam:  Dressing was removed.  There was a fair amount of bloody drainage.  The incision edges are intact.  Curlex and Ace wrap were reapplied       Assessment/Plan:  POD #1  Dressing changed today as the patient felt it was too tight.  The wound appears healthy.  It was rewrapped.  Adam Howell 03/14/2022 4:08 PM --  Vitals:   03/14/22 1246 03/14/22 1535  BP: (!) 168/71 (!) 163/78  Pulse: 93 (!) 109  Resp: 17 18  Temp:  98.2 F (36.8 C)  SpO2:      Intake/Output Summary (Last 24 hours) at 03/14/2022 1608 Last data filed at 03/14/2022 1241 Gross per 24 hour  Intake 441.27 ml  Output 3000 ml  Net -2558.73 ml     Laboratory CBC    Component Value Date/Time   WBC 13.3 (H) 03/14/2022 0849   HGB 9.6 (L) 03/14/2022 0849   HGB 14.4 02/03/2022 1441   HCT 28.7 (L) 03/14/2022 0849   HCT 44.0 02/03/2022 1441   PLT 354 03/14/2022 0849   PLT 200 02/03/2022 1441    BMET    Component Value Date/Time   NA 134 (L) 03/14/2022 0849   NA 136 02/03/2022 1441   K 3.6 03/14/2022 0849   CL 96 (L) 03/14/2022 0849   CO2 27 03/14/2022 0849   GLUCOSE 143 (H) 03/14/2022 0849   BUN 25 (H) 03/14/2022 0849   BUN 35 (H) 02/03/2022 1441   CREATININE 5.68 (H) 03/14/2022 0849   CALCIUM 8.3 (L) 03/14/2022 0849   GFRNONAA 11 (L) 03/14/2022 0849    COAG Lab Results  Component Value Date   INR 1.2 03/05/2022   No results found for: "PTT"  Antibiotics Anti-infectives (From admission, onward)    Start     Dose/Rate Route Frequency Ordered Stop   03/13/22 1200  vancomycin (VANCOCIN) IVPB 1000 mg/200 mL premix  Status:  Discontinued        1,000 mg 200 mL/hr over 60 Minutes Intravenous Every M-W-F (Hemodialysis) 03/11/22 1042 03/11/22 1105   03/13/22 0733  ceFAZolin (ANCEF) IVPB 2g/100 mL premix  Status:  Discontinued        2 g 200 mL/hr over  30 Minutes Intravenous 30 min pre-op 03/13/22 0734 03/13/22 1151   03/12/22 2200  linezolid (ZYVOX) IVPB 600 mg        600 mg 300 mL/hr over 60 Minutes Intravenous Every 12 hours 03/12/22 1530     03/12/22 1800  meropenem (MERREM) 500 mg in sodium chloride 0.9 % 100 mL IVPB        500 mg 200 mL/hr over 30 Minutes Intravenous Every 24 hours 03/12/22 1532     03/11/22 1400  acyclovir (ZOVIRAX) 480 mg in dextrose 5 % 250 mL IVPB  Status:  Discontinued        5 mg/kg  96.3 kg (Adjusted) 259.6 mL/hr over 60 Minutes Intravenous Every 24 hours 03/11/22 1042 03/11/22 1057   03/11/22 1400  vancomycin (VANCOCIN) IVPB 1000 mg/200 mL premix  Status:  Discontinued       See Hyperspace for full Linked Orders Report.   1,000 mg 200 mL/hr over 60 Minutes Intravenous  Once 03/11/22 1042 03/11/22 1100   03/11/22 1200  vancomycin (VANCOREADY) IVPB 1500 mg/300 mL  Status:  Discontinued  See Hyperspace for full Linked Orders Report.   1,500 mg 150 mL/hr over 120 Minutes Intravenous  Once 03/11/22 1042 03/11/22 1100   03/11/22 1130  ampicillin (OMNIPEN) 2 g in sodium chloride 0.9 % 100 mL IVPB  Status:  Discontinued        2 g 300 mL/hr over 20 Minutes Intravenous Every 12 hours 03/11/22 1042 03/11/22 1057   03/11/22 1130  cefTRIAXone (ROCEPHIN) 2 g in sodium chloride 0.9 % 100 mL IVPB  Status:  Discontinued        2 g 200 mL/hr over 30 Minutes Intravenous Every 12 hours 03/11/22 1042 03/11/22 1057   03/06/22 1200  vancomycin (VANCOCIN) IVPB 1000 mg/200 mL premix  Status:  Discontinued        1,000 mg 200 mL/hr over 60 Minutes Intravenous Every M-W-F (Hemodialysis) 03/06/22 0117 03/09/22 1121   03/06/22 0400  piperacillin-tazobactam (ZOSYN) IVPB 2.25 g  Status:  Discontinued        2.25 g 100 mL/hr over 30 Minutes Intravenous Every 8 hours 03/06/22 0153 03/12/22 1530   03/06/22 0130  piperacillin-tazobactam (ZOSYN) IVPB 2.25 g  Status:  Discontinued        2.25 g 100 mL/hr over 30 Minutes Intravenous  Every 8 hours 03/06/22 0118 03/06/22 0153   03/06/22 0115  vancomycin (VANCOCIN) IVPB 1000 mg/200 mL premix        1,000 mg 200 mL/hr over 60 Minutes Intravenous  Once 03/06/22 0114 03/06/22 0316   03/05/22 2100  vancomycin (VANCOREADY) IVPB 1500 mg/300 mL  Status:  Discontinued       See Hyperspace for full Linked Orders Report.   1,500 mg 150 mL/hr over 120 Minutes Intravenous  Once 03/05/22 1828 03/05/22 1921   03/05/22 1930  vancomycin (VANCOREADY) IVPB 1500 mg/300 mL        1,500 mg 150 mL/hr over 120 Minutes Intravenous  Once 03/05/22 1921 03/05/22 2231   03/05/22 1830  vancomycin (VANCOREADY) IVPB 1500 mg/300 mL  Status:  Discontinued        1,500 mg 150 mL/hr over 120 Minutes Intravenous  Once 03/05/22 1825 03/05/22 1828   03/05/22 1830  piperacillin-tazobactam (ZOSYN) IVPB 3.375 g        3.375 g 100 mL/hr over 30 Minutes Intravenous  Once 03/05/22 1825 03/05/22 2027   03/05/22 1830  vancomycin (VANCOCIN) IVPB 1000 mg/200 mL premix  Status:  Discontinued       See Hyperspace for full Linked Orders Report.   1,000 mg 200 mL/hr over 60 Minutes Intravenous  Once 03/05/22 1828 03/05/22 1921        V. Leia Alf, M.D., [facility] [place] Office: [phone] Pager:  [phone]

## 2022-03-14 NOTE — Progress Notes (Signed)
Bag of D10 empty. Notified pharmacy for another bag. Continuous infusion.

## 2022-03-15 DIAGNOSIS — L089 Local infection of the skin and subcutaneous tissue, unspecified: Secondary | ICD-10-CM | POA: Diagnosis not present

## 2022-03-15 DIAGNOSIS — T148XXA Other injury of unspecified body region, initial encounter: Secondary | ICD-10-CM | POA: Diagnosis not present

## 2022-03-15 DIAGNOSIS — G9341 Metabolic encephalopathy: Secondary | ICD-10-CM | POA: Diagnosis not present

## 2022-03-15 DIAGNOSIS — I5022 Chronic systolic (congestive) heart failure: Secondary | ICD-10-CM | POA: Diagnosis not present

## 2022-03-15 DIAGNOSIS — Z89611 Acquired absence of right leg above knee: Secondary | ICD-10-CM | POA: Insufficient documentation

## 2022-03-15 LAB — MAGNESIUM: Magnesium: 1.9 mg/dL (ref 1.7–2.4)

## 2022-03-15 LAB — PHOSPHORUS: Phosphorus: 3.9 mg/dL (ref 2.5–4.6)

## 2022-03-15 LAB — CBC
HCT: 30.6 % — ABNORMAL LOW (ref 39.0–52.0)
Hemoglobin: 10 g/dL — ABNORMAL LOW (ref 13.0–17.0)
MCH: 27.9 pg (ref 26.0–34.0)
MCHC: 32.7 g/dL (ref 30.0–36.0)
MCV: 85.5 fL (ref 80.0–100.0)
Platelets: 336 10*3/uL (ref 150–400)
RBC: 3.58 MIL/uL — ABNORMAL LOW (ref 4.22–5.81)
RDW: 15.9 % — ABNORMAL HIGH (ref 11.5–15.5)
WBC: 9.7 10*3/uL (ref 4.0–10.5)
nRBC: 0 % (ref 0.0–0.2)

## 2022-03-15 LAB — BASIC METABOLIC PANEL
Anion gap: 10 (ref 5–15)
BUN: 21 mg/dL — ABNORMAL HIGH (ref 6–20)
CO2: 31 mmol/L (ref 22–32)
Calcium: 8.3 mg/dL — ABNORMAL LOW (ref 8.9–10.3)
Chloride: 93 mmol/L — ABNORMAL LOW (ref 98–111)
Creatinine, Ser: 4.88 mg/dL — ABNORMAL HIGH (ref 0.61–1.24)
GFR, Estimated: 13 mL/min — ABNORMAL LOW (ref >60)
Glucose, Bld: 113 mg/dL — ABNORMAL HIGH (ref 70–99)
Potassium: 3.4 mmol/L — ABNORMAL LOW (ref 3.5–5.1)
Sodium: 134 mmol/L — ABNORMAL LOW (ref 135–145)

## 2022-03-15 LAB — GLUCOSE, CAPILLARY
Glucose-Capillary: 115 mg/dL — ABNORMAL HIGH (ref 70–99)
Glucose-Capillary: 106 mg/dL — ABNORMAL HIGH (ref 70–99)
Glucose-Capillary: 140 mg/dL — ABNORMAL HIGH (ref 70–99)

## 2022-03-15 MED ORDER — OXYCODONE-ACETAMINOPHEN 5-325 MG PO TABS
1.0000 | ORAL_TABLET | ORAL | Status: DC | PRN
  Administered 2022-03-15: 2 via ORAL
  Administered 2022-03-15: 1 via ORAL
  Administered 2022-03-16 – 2022-03-17 (×5): 2 via ORAL
  Administered 2022-03-17: 1 via ORAL
  Administered 2022-03-17 – 2022-03-18 (×7): 2 via ORAL
  Administered 2022-03-19: 1 via ORAL
  Administered 2022-03-19 (×2): 2 via ORAL
  Filled 2022-03-15 (×13): qty 2
  Filled 2022-03-15: qty 1
  Filled 2022-03-15 (×4): qty 2

## 2022-03-15 NOTE — Evaluation (Signed)
Physical Therapy Evaluation Patient Details Name: Adam Howell MRN: [id number] DOB: 02/13/1961 Today's Date: 03/15/2022  History of Present Illness  Pt is a 61 year old male presenting ED due to concerns for increased drainage and foul odor from his wound in spite of ongoing dressings; hospital course complicated by dense encephalopathy, code stroke called 12/26 with MRI impression including "Two small acute or subacute infarcts in the right cerebral white matter"; Pt s/p R AKA 12/29. PMH significant for DM with complications of ESRD on hemodialysis (M/W/F), history of A-fib on Eliquis and amiodarone, HFpEF (EF 35 to 40% 2021), and secondary hyperparathyroidism.   Clinical Impression  Pt was pleasant and motivated to participate during the session and put forth good effort throughout. Pt required +2 physical assistance with bed mobility tasks along with cues for proper sequencing.  Once in sitting pt's RLE ace bandage slid off with min active bleeding noted at the incision site, nursing notified.  Pt able to do some minimal lateral scooting towards the [facility] with min assist. Attempts at standing deferred secondary to uncovered bleeding incision.  Pt will benefit from PT services in an IR setting upon discharge to safely address deficits listed in patient problem list for decreased caregiver assistance and eventual return to PLOF.         Recommendations for follow up therapy are one component of a multi-disciplinary discharge planning process, led by the attending physician.  Recommendations may be updated based on patient status, additional functional criteria and insurance authorization.  Follow Up Recommendations Acute inpatient rehab (3hours/day)      Assistance Recommended at Discharge Frequent or constant Supervision/Assistance  Patient can return home with the following  A lot of help with walking and/or transfers;A lot of help with bathing/dressing/bathroom;Assistance with  cooking/housework;Direct supervision/assist for medications management;Direct supervision/assist for financial management;Assist for transportation    Equipment Recommendations Other (comment) (TBD at next venue of care)  Recommendations for Other Services       Functional Status Assessment Patient has had a recent decline in their functional status and demonstrates the ability to make significant improvements in function in a reasonable and predictable amount of time.     Precautions / Restrictions Precautions Precautions: Fall Precaution Comments: s/p R AKA Restrictions Weight Bearing Restrictions: Yes RLE Weight Bearing: Non weight bearing      Mobility  Bed Mobility Overal bed mobility: Needs Assistance Bed Mobility: Supine to Sit, Sit to Supine     Supine to sit: Mod assist, +2 for physical assistance Sit to supine: Mod assist, +2 for physical assistance   General bed mobility comments: +2 Mod A for BLE and trunk control with cues for sequencing for use of rails    Transfers Overall transfer level: Needs assistance Equipment used: None Transfers: Bed to chair/wheelchair/BSC            Lateral/Scoot Transfers: Min assist General transfer comment: Pt able to laterally scoot around 6 inches with min A; standing deferred secondary to dressing coming off of RLE with signs of min bleeding, nursing notified    Ambulation/Gait                  Stairs            Wheelchair Mobility    Modified Rankin (Stroke Patients Only)       Balance Overall balance assessment: Needs assistance Sitting-balance support: Bilateral upper extremity supported Sitting balance-Leahy Scale: Good  Pertinent Vitals/Pain Pain Assessment Pain Assessment: 0-10 Pain Score: 10-Worst pain ever Pain Location: RLE Pain Descriptors / Indicators: Sore Pain Intervention(s): Repositioned, Premedicated before session,  Monitored during session, Patient requesting pain meds-RN notified    Home Living Family/patient expects to be discharged to:: Private residence Living Arrangements: Other relatives (daughter) Available Help at Discharge: Family;Available 24 hours/day Type of Home: Apartment Home Access: Level entry       Home Layout: One level Home Equipment: Conservation officer, nature (2 wheels) Additional Comments: Pt reports he lives with daughter who does not work    Prior Administrator, Civil Service Comments: Mod Ind amb with a RW community distances, no AD for household ambulation, no fall history ADLs Comments: generally MOD I-I in ADL, some assist for IADLs     Hand Dominance   Dominant Hand: Right    Extremity/Trunk Assessment   Upper Extremity Assessment Upper Extremity Assessment: Defer to OT evaluation    Lower Extremity Assessment Lower Extremity Assessment: Generalized weakness;RLE deficits/detail RLE Deficits / Details: new R AKA       Communication   Communication: No difficulties  Cognition Arousal/Alertness: Awake/alert Behavior During Therapy: WFL for tasks assessed/performed Overall Cognitive Status: No family/caregiver present to determine baseline cognitive functioning                                          General Comments      Exercises Total Joint Exercises Ankle Circles/Pumps: AROM, Strengthening, Left, 5 reps, 10 reps Quad Sets: Strengthening, Left, 5 reps, 10 reps Gluteal Sets: Strengthening, Both, 5 reps, 10 reps Long Arc Quad: AROM, Strengthening, Left, 10 reps, 5 reps Knee Flexion: AROM, Strengthening, Left, 5 reps, 10 reps Other Exercises Other Exercises: Pt education on L sidelying R hip ext AROM to prevent hip flex contracture   Assessment/Plan    PT Assessment Patient needs continued PT services  PT Problem List Decreased strength;Decreased activity tolerance;Decreased balance;Decreased mobility;Decreased knowledge of use  of DME;Pain       PT Treatment Interventions DME instruction;Gait training;Functional mobility training;Therapeutic activities;Therapeutic exercise;Balance training;Patient/family education    PT Goals (Current goals can be found in the Care Plan section)  Acute Rehab PT Goals Patient Stated Goal: To walk again PT Goal Formulation: With patient Time For Goal Achievement: 03/28/22 Potential to Achieve Goals: Good    Frequency 7X/week     Co-evaluation               AM-PAC PT "6 Clicks" Mobility  Outcome Measure Help needed turning from your back to your side while in a flat bed without using bedrails?: A Lot Help needed moving from lying on your back to sitting on the side of a flat bed without using bedrails?: A Lot Help needed moving to and from a bed to a chair (including a wheelchair)?: A Lot Help needed standing up from a chair using your arms (e.g., wheelchair or bedside chair)?: A Lot Help needed to walk in hospital room?: Total Help needed climbing 3-5 steps with a railing? : Total 6 Click Score: 10    End of Session   Activity Tolerance: Other (comment) (Limited by min bleeding from RLE incision site) Patient left: in bed;with call bell/phone within reach;with bed alarm set Nurse Communication: Mobility status;Other (comment) (RLE bleeding and ace bandage coming off) PT Visit  Diagnosis: Muscle weakness (generalized) (M62.81);Difficulty in walking, not elsewhere classified (R26.2);Pain Pain - Right/Left: Right Pain - part of body: Leg    Time: 4461-9012 PT Time Calculation (min) (ACUTE ONLY): 30 min   Charges:   PT Evaluation $PT Eval Moderate Complexity: 1 Mod PT Treatments $Therapeutic Exercise: 8-22 mins       D. Royetta Asal PT, DPT 03/15/22, 3:40 PM

## 2022-03-15 NOTE — Progress Notes (Signed)
[facility], [place] 03/15/22  Subjective:   LOS: 10  Patient known to our practice from previous admissions.  Patient admitted for worsening right lower extremity ulcer.  Currently getting IV broad-spectrum antibiotics.   Patient underwent irrigation and debridement of right lower extremity and placement of wound VAC.  Patient seen and evaluated at bedside Currently alert and oriented Pain well-managed with prescribed medications Dialysis received yesterday, tolerated well  Objective:  Vital signs in last 24 hours:  Temp:  [98.1 F (36.7 C)-98.8 F (37.1 C)] 98.1 F (36.7 C) (12/31 0732) Pulse Rate:  [90-109] 97 (12/31 0732) Resp:  [17-25] 17 (12/31 0732) BP: (142-173)/(71-90) 142/77 (12/31 0732) SpO2:  [97 %-100 %] 100 % (12/31 0732) Weight:  [103 kg-108.6 kg] 108.6 kg (12/31 0500)  Weight change: -0.5 kg Filed Weights   03/14/22 0830 03/14/22 1241 03/15/22 0500  Weight: 103.8 kg 103 kg 108.6 kg    Intake/Output:    Intake/Output Summary (Last 24 hours) at 03/15/2022 1210 Last data filed at 03/14/2022 1241 Gross per 24 hour  Intake --  Output 1500 ml  Net -1500 ml      Physical Exam: General: NAD, laying in the bed  HEENT Moist oral mucous membranes  Pulm/lungs Normal breathing effort  CVS/Heart No rub  Abdomen:  Soft, nontender, nondistended  Extremities: Rt AKA, no edema  Neurologic: Alert and oriented  Skin: No acute rashes  Access: AV fistula       Basic Metabolic Panel:  Recent Labs  Lab 03/10/22 1706 03/12/22 0540 03/14/22 0849 03/15/22 0518  NA 133* 136 134* 134*  K 5.2* 4.6 3.6 3.4*  CL 93* 96* 96* 93*  CO2 25 27 27 31   GLUCOSE 78 88 143* 113*  BUN 53* 37* 25* 21*  CREATININE 12.03* 8.59* 5.68* 4.88*  CALCIUM 8.6* 8.4* 8.3* 8.3*  MG  --   --   --  1.9  PHOS  --   --  4.8* 3.9      CBC: Recent Labs  Lab 03/10/22 1706 03/12/22 0540 03/14/22 0849 03/15/22 0518  WBC 7.9 7.9 13.3* 9.7  HGB 10.0*  10.3* 9.6* 10.0*  HCT 31.5* 32.2* 28.7* 30.6*  MCV 88.5 88.2 83.7 85.5  PLT 353 341 354 336       Lab Results  Component Value Date   HEPBSAG NON REACTIVE 03/06/2022      Microbiology:  Recent Results (from the past 240 hour(s))  Culture, blood (routine x 2)     Status: None   Collection Time: 03/05/22  7:43 PM   Specimen: BLOOD  Result Value Ref Range Status   Specimen Description BLOOD BLOOD RIGHT ARM  Final   Special Requests   Final    BOTTLES DRAWN AEROBIC AND ANAEROBIC Blood Culture results may not be optimal due to an inadequate volume of blood received in culture bottles   Culture   Final    NO GROWTH 5 DAYS Performed at [facility], [address] [place], [place] [address]    Report Status 03/10/2022 FINAL  Final  Surgical PCR screen     Status: None   Collection Time: 03/06/22 11:31 PM   Specimen: Nasal Mucosa; Nasal Swab  Result Value Ref Range Status   MRSA, PCR NEGATIVE NEGATIVE Final   Staphylococcus aureus NEGATIVE NEGATIVE Final    Comment: (NOTE) The Xpert SA Assay (FDA approved for NASAL specimens in patients 25 years of age and older), is one component of a comprehensive  surveillance program. It is not intended to diagnose infection nor to guide or monitor treatment. Performed at [facility], [address] [place], [place] [address]   Aerobic/Anaerobic Culture w Gram Stain (surgical/deep wound)     Status: None   Collection Time: 03/07/22  9:50 AM   Specimen: Wound  Result Value Ref Range Status   Specimen Description   Final    WOUND Performed at [facility], [address], [place], [place] [address]    Special Requests   Final    right lower extremity wound Performed at [facility], [address] [place], [place] [address]    Gram Stain   Final    FEW GRAM NEGATIVE RODS RARE WBC PRESENT, PREDOMINANTLY PMN    Culture   Final    FEW CITROBACTER KOSERI FEW ENTEROCOCCUS FAECIUM FEW  PROTEUS MIRABILIS VANCOMYCIN RESISTANT ENTEROCOCCUS ISOLATED NO ANAEROBES ISOLATED Performed at [facility], [place] [address] [place], [place] [address]    Report Status 03/12/2022 FINAL  Final   Organism ID, Bacteria PROTEUS MIRABILIS  Final   Organism ID, Bacteria CITROBACTER KOSERI  Final   Organism ID, Bacteria ENTEROCOCCUS FAECIUM  Final      Susceptibility   Citrobacter koseri - MIC*    CEFAZOLIN 16 SENSITIVE Sensitive     CEFEPIME 0.5 SENSITIVE Sensitive     CEFTAZIDIME 2 SENSITIVE Sensitive     CEFTRIAXONE 0.5 SENSITIVE Sensitive     CIPROFLOXACIN <=0.25 SENSITIVE Sensitive     GENTAMICIN <=1 SENSITIVE Sensitive     IMIPENEM <=0.25 SENSITIVE Sensitive     TRIMETH/SULFA <=20 SENSITIVE Sensitive     PIP/TAZO 64 INTERMEDIATE Intermediate     * FEW CITROBACTER KOSERI   Enterococcus faecium - MIC*    AMPICILLIN >=32 RESISTANT Resistant     VANCOMYCIN >=32 RESISTANT Resistant     GENTAMICIN SYNERGY SENSITIVE Sensitive     LINEZOLID 2 SENSITIVE Sensitive     * FEW ENTEROCOCCUS FAECIUM   Proteus mirabilis - MIC*    AMPICILLIN <=2 SENSITIVE Sensitive     CEFAZOLIN 8 SENSITIVE Sensitive     CEFEPIME 0.25 SENSITIVE Sensitive     CEFTAZIDIME <=1 SENSITIVE Sensitive     CEFTRIAXONE 0.5 SENSITIVE Sensitive     CIPROFLOXACIN <=0.25 SENSITIVE Sensitive     GENTAMICIN <=1 SENSITIVE Sensitive     IMIPENEM 2 SENSITIVE Sensitive     TRIMETH/SULFA <=20 SENSITIVE Sensitive     AMPICILLIN/SULBACTAM <=2 SENSITIVE Sensitive     PIP/TAZO <=4 SENSITIVE Sensitive     * FEW PROTEUS MIRABILIS    Coagulation Studies: No results for input(s): "LABPROT", "INR" in the last 72 hours.   Urinalysis: No results for input(s): "COLORURINE", "LABSPEC", "PHURINE", "GLUCOSEU", "HGBUR", "BILIRUBINUR", "KETONESUR", "PROTEINUR", "UROBILINOGEN", "NITRITE", "LEUKOCYTESUR" in the last 72 hours.  Invalid input(s): "APPERANCEUR"    Imaging: No results found.   Medications:    anticoagulant  sodium citrate     levETIRAcetam      amiodarone  200 mg Oral BID   apixaban  2.5 mg Oral BID   bumetanide  1 mg Oral BID   Chlorhexidine Gluconate Cloth  6 each Topical Q0600   feeding supplement  237 mL Oral BID BM   multivitamin  1 tablet Oral QHS   rosuvastatin  40 mg Oral Daily   acetaminophen **OR** acetaminophen, alteplase, anticoagulant sodium citrate, dextrose, heparin, lidocaine (PF), lidocaine-prilocaine, ondansetron **OR** ondansetron (ZOFRAN) IV, ondansetron (ZOFRAN) IV, oxyCODONE-acetaminophen, pentafluoroprop-tetrafluoroeth  Assessment/ Plan:  61  y.o. male with diabetes with complications, ESRD, atrial fibrillation requiring Eliquis, amiodarone, congestive heart failure, secondary hyperparathyroidism, chronic nonhealing right lower extremity wound was admitted on 03/05/2022 for  Principal Problem:   Wound infection of chronic right lower extremity ulcer Active Problems:   Anemia in chronic kidney disease   ESRD on dialysis ([place])   Obesity (BMI 30-39.9)   Type 2 diabetes mellitus with ESRD (end-stage renal disease) (HCC)   Chronic atrial fibrillation with RVR (HCC)   Hyperlipidemia associated with type 2 diabetes mellitus (HCC)   Chronic systolic CHF (congestive heart failure) (HCC)   Hypokalemia   Hyponatremia   Acute metabolic encephalopathy   Hypotension   Uncontrolled type 2 diabetes mellitus with hypoglycemia, with long-term current use of insulin ([place])  Unspecified open wound, right lower leg, initial encounter [S81.801A] Necrotizing fasciitis of lower leg ([place]) [M72.6]  #. ESRD [facility]/MWF/left aVF   Patient received dialysis yesterday, UF goal 1.5 L achieved.  Next treatment scheduled for Tuesday.  Will continue to monitor discharge planning as it will rehab placement.  #. Anemia of CKD  Lab Results  Component Value Date   HGB 10.0 (L) 03/15/2022   Hemoglobin above target.  No need for ESA's at this time.  #. Secondary  hyperparathyroidism of renal origin N 25.81   No results found for: "PTH" Lab Results  Component Value Date   PHOS 3.9 03/15/2022    Will continue to monitor bone minerals during this admission.  Calcium and phosphorus currently acceptable.  #.  Nonhealing right lower extremity ulcer with wound infection -Completed antibiotic therapy. -Irrigation and debridement with wound vac placement with undesirable results. Rt AKA completed on 03/13/22. Vascular continues to follow  # Acute metabolic encephalopathy.  MRI head suggestive of subacute infarct however neurology has disputed this finding.  EEG performed, suggestive of metabolic encephalopathy.  Mentation has returned to baseline.   LOS: [place] 12/31/202312:10 [place], [place]

## 2022-03-15 NOTE — Progress Notes (Signed)
Progress Note   Patient: Adam Howell TGG:[id number] DOB: 10-26-1960 DOA: 03/05/2022     10 DOS: the patient was seen and examined on 03/15/2022   Brief hospital course:  Mykell Rawl is a 61 y.o. male with medical history significant for diabetes mellitus with complications of end-stage renal disease on hemodialysis (M/W/F), history of A-fib on Eliquis and amiodarone, HFpEF (EF 35 to 40% 2021), secondary hyperparathyroidism with chronic nonhealing right lower extremity wound for which he was hospitalized for debridement and IV antibiotics from 10/31 to 11/6.  He was sent to the ED by his home health nurse due to concerns for increased drainage and foul odor from the wound in spite of ongoing dressings.  Patient also reports increased pain of the lower leg.   MRI right tib-fib suggesting myositis without osteomyelitis. Patient was started on Zosyn and vancomycin.  Patient was taken to the OR for debridement on 12/23.  Wound culture only grew out gram-negative rods, antibiotics switched to Zosyn only.  Patient had a prolonged episode of altered mental status postop, currently improved.  Had another episode of decreased responsiveness on 12/26.  Seen by neurology, most likely metabolic encephalopathy.  MRI of the brain reported as new stroke, neurology has reviewed it, does not believe this is stroke. 12/27.  Patient has dense encephalopathy, after discussion with neurology, ID, determined this is mainly metabolic in origin. 12/29.  Patient had right-sided above-knee amputation today.  Already waking up postop.  12/31.  Condition much improved, antibiotic completed.  Pending CIR transfer.      Assessment and Plan: Acute metabolic encephalopathy. Hypotension. Patient had a prolonged altered mental status post op due to end-stage renal disease.  Blood pressure was low overnight, received fluid bolus.  Mental status finally improved. Patient had another episode of mental status change  am of 12/26, he was confused, minimally responsive.  Code stroke was called, seen by Dr. Cheral Marker, does not appear to have stroke, I reviewed the CT images together with exam, he appears to have some old stroke, no new stroke at this time.  MRI was also performed, reportedly a new stroke, but reviewed with Dr. Cheral Marker, does not appear to be stroke.  EEG is consistent with metabolic encephalopathy. Mental status has normalized.     Wound infection of chronic right lower extremity ulcer Blood culture negative. Patient had a debridement performed 12/23.  Gram stain shows gram negative rods, culture only has gram-negative rods.  Discontinue vancomycin, continue Zosyn for now. Wound culture still not finalized, appears to be largely microbial, but all gram-negative. Patient appears to have nonsalvageable right lower extremity with a severe wound.   Patient finally agreeable for amputation, he had above-knee amputation.   The wound culture finalized with Proteus mirabilis, Citrobacter, and Enterococcus faecium. Antibiotics completed today.   Chronic systolic CHF (congestive heart failure) (HCC) Condition stable without volume overload.   Type 2 diabetes mellitus with ESRD (end-stage renal disease) (HCC) Hypoglycemia. Continue D10 drip, glucose more stable.   ESRD on dialysis [facility]) Patient does not have volume overload, followed by nephrology for scheduled dialysis.   Chronic Atrial fibrillation (HCC) Continue Eliquis and amiodarone   Anemia in chronic kidney disease Stable.   Hyperlipidemia associated with type 2 diabetes mellitus ([place]) Continue rosuvastatin   Obesity with BMI 31.87        Subjective: Patient has no complaint today, good appetite.  No shortness of breath.  No confusion.  Physical Exam: Vitals:   03/14/22 1928 03/15/22 0445  03/15/22 0500 03/15/22 0732  BP: (!) 156/88 (!) 157/86  (!) 142/77  Pulse: (!) 101 100  97  Resp: 17 17  17   Temp: 98.8 F (37.1 C) 98.7  F (37.1 C)  98.1 F (36.7 C)  TempSrc: Oral Oral  Oral  SpO2: 99% 100%  100%  Weight:   108.6 kg   Height:       General exam: Appears calm and comfortable  Respiratory system: Clear to auscultation. Respiratory effort normal. Cardiovascular system: S1 & S2 heard, RRR. No JVD, murmurs, rubs, gallops or clicks. No pedal edema. Gastrointestinal system: Abdomen is nondistended, soft and nontender. No organomegaly or masses felt. Normal bowel sounds heard. Central nervous system: Alert and oriented. No focal neurological deficits. Extremities: Right AKA. Skin: No rashes, lesions or ulcers Psychiatry: Judgement and insight appear normal. Mood & affect appropriate.   Data Reviewed:  There are no new results to review at this time.  Family Communication: None  Disposition: Status is: Inpatient Remains inpatient appropriate because: Unsafe discharge pending CIR transfer.  Planned Discharge Destination:  CIR    Time spent: 35 minutes  Author: Sharen Hones, MD 03/15/2022 12:22 PM  For on call review [email or web address].

## 2022-03-16 DIAGNOSIS — N186 End stage renal disease: Secondary | ICD-10-CM | POA: Diagnosis not present

## 2022-03-16 DIAGNOSIS — T148XXA Other injury of unspecified body region, initial encounter: Secondary | ICD-10-CM | POA: Diagnosis not present

## 2022-03-16 DIAGNOSIS — L089 Local infection of the skin and subcutaneous tissue, unspecified: Secondary | ICD-10-CM | POA: Diagnosis not present

## 2022-03-16 DIAGNOSIS — G9341 Metabolic encephalopathy: Secondary | ICD-10-CM | POA: Diagnosis not present

## 2022-03-16 DIAGNOSIS — I5022 Chronic systolic (congestive) heart failure: Secondary | ICD-10-CM

## 2022-03-16 LAB — GLUCOSE, CAPILLARY
Glucose-Capillary: 111 mg/dL — ABNORMAL HIGH (ref 70–99)
Glucose-Capillary: 154 mg/dL — ABNORMAL HIGH (ref 70–99)
Glucose-Capillary: 130 mg/dL — ABNORMAL HIGH (ref 70–99)
Glucose-Capillary: 137 mg/dL — ABNORMAL HIGH (ref 70–99)

## 2022-03-16 MED ORDER — ROSUVASTATIN CALCIUM 10 MG PO TABS
10.0000 mg | ORAL_TABLET | Freq: Every day | ORAL | Status: DC
  Administered 2022-03-17 – 2022-03-19 (×3): 10 mg via ORAL
  Filled 2022-03-16 (×3): qty 1

## 2022-03-16 NOTE — Progress Notes (Signed)
Progress Note   Patient: Adam Howell ATF:[id number] DOB: 10/13/1960 DOA: 03/05/2022     11 DOS: the patient was seen and examined on 03/16/2022   Brief hospital course:  Adam Howell is a 61 y.o. male with medical history significant for diabetes mellitus with complications of end-stage renal disease on hemodialysis (M/W/F), history of A-fib on Eliquis and amiodarone, HFpEF (EF 35 to 40% 2021), secondary hyperparathyroidism with chronic nonhealing right lower extremity wound for which he was hospitalized for debridement and IV antibiotics from 10/31 to 11/6.  He was sent to the ED by his home health nurse due to concerns for increased drainage and foul odor from the wound in spite of ongoing dressings.  Patient also reports increased pain of the lower leg.   MRI right tib-fib suggesting myositis without osteomyelitis. Patient was started on Zosyn and vancomycin.  Patient was taken to the OR for debridement on 12/23.  Wound culture only grew out gram-negative rods, antibiotics switched to Zosyn only.  Patient had a prolonged episode of altered mental status postop, currently improved.  Had another episode of decreased responsiveness on 12/26.  Seen by neurology, most likely metabolic encephalopathy.  MRI of the brain reported as new stroke, neurology has reviewed it, does not believe this is stroke. 12/27.  Patient has dense encephalopathy, after discussion with neurology, ID, determined this is mainly metabolic in origin. 12/29.  Patient had right-sided above-knee amputation today.  Already waking up postop.  12/31.  Condition much improved, antibiotic completed.  Pending CIR transfer.      Assessment and Plan: Acute metabolic encephalopathy. Hypotension. Patient had a prolonged altered mental status post op due to end-stage renal disease.  Blood pressure was low overnight, received fluid bolus.  Mental status finally improved. Patient had another episode of mental status change am  of 12/26, he was confused, minimally responsive.  Code stroke was called, seen by Dr. Cheral Marker, does not appear to have stroke, I reviewed the CT images together with exam, he appears to have some old stroke, no new stroke at this time.  MRI was also performed, reportedly a new stroke, but reviewed with Dr. Cheral Marker, does not appear to be stroke.  EEG is consistent with metabolic encephalopathy. Mental status has normalized. Patient also starting to have an good appetite, eating well, will monitor phosphorus level to make sure patient does not develop significant hypophosphatemia.  Also this is less likely as patient has a history of renal disease.   Wound infection of chronic right lower extremity ulcer due to polymicrobial infection Blood culture negative. Patient had a debridement performed 12/23.  Gram stain shows gram negative rods, culture only has gram-negative rods.  Discontinue vancomycin, continue Zosyn for now. Wound culture still not finalized, appears to be largely microbial, but all gram-negative. Patient appears to have nonsalvageable right lower extremity with a severe wound.   Patient finally agreeable for amputation, he had above-knee amputation.   The wound culture finalized with Proteus mirabilis, Citrobacter, and Enterococcus faecium. Antibiotics completed.   Chronic systolic CHF (congestive heart failure) (HCC) Condition stable without volume overload.   Type 2 diabetes mellitus with ESRD (end-stage renal disease) (HCC) Hypoglycemia. Continue D10 drip, glucose more stable.   ESRD on dialysis [facility]) Patient does not have volume overload, followed by nephrology for scheduled dialysis.   Chronic Atrial fibrillation (HCC) Continue Eliquis and amiodarone   Anemia in chronic kidney disease Stable.   Hyperlipidemia associated with type 2 diabetes mellitus (HCC) Continue rosuvastatin  Subjective:  Patient doing well, no confusion, good appetite.  No short of  breath or hypoxia.  Physical Exam: Vitals:   03/15/22 2028 03/16/22 0300 03/16/22 0500 03/16/22 0700  BP: 116/66 (!) 123/55  127/65  Pulse: 91 90  84  Resp: 20 17  18   Temp: 97.8 F (36.6 C) 98 F (36.7 C)  98.3 F (36.8 C)  TempSrc: Oral Oral  Oral  SpO2: 100% 99%  100%  Weight:   108.1 kg   Height:       General exam: Appears calm and comfortable  Respiratory system: Clear to auscultation. Respiratory effort normal. Cardiovascular system: S1 & S2 heard, RRR. No JVD, murmurs, rubs, gallops or clicks. No pedal edema. Gastrointestinal system: Abdomen is nondistended, soft and nontender. No organomegaly or masses felt. Normal bowel sounds heard. Central nervous system: Alert and oriented. No focal neurological deficits. Extremities: Right AKA. Skin: No rashes, lesions or ulcers Psychiatry: Judgement and insight appear normal. Mood & affect appropriate.   Data Reviewed:  There are no new results to review at this time.  Family Communication: None  Disposition: Status is: Inpatient Remains inpatient appropriate because: Unsafe discharge, pending CIR transfer.  Planned Discharge Destination: Rehab    Time spent: 35 minutes  Author: Sharen Hones, MD 03/16/2022 1:10 PM  For on call review [email or web address].

## 2022-03-16 NOTE — TOC Progression Note (Signed)
Transition of Care (TOC) - Progression Note    Patient Details  Name: Adam Howell MRN: [id number] Date of Birth: 05/30/1960  Transition of Care (TOC) CM/SW Contact  Sarah C Boswell, LCSW Phone Number: 03/16/2022, 1:36 PM  Clinical Narrative:  Met with patient. Discussed CIR vs SNF. He is agreeable to both.   Expected Discharge Plan: Home w Home Health Services Barriers to Discharge: Continued Medical Work up  Expected Discharge Plan and Services                                               Social Determinants of Health (SDOH) Interventions SDOH Screenings   Food Insecurity: Food Insecurity Present (03/06/2022)  Housing: Low Risk  (03/06/2022)  Transportation Needs: No Transportation Needs (03/06/2022)  Utilities: Not At Risk (03/06/2022)  Alcohol Screen: Low Risk  (11/25/2021)  Depression (PHQ2-9): Low Risk  (11/25/2021)  Financial Resource Strain: Low Risk  (11/25/2021)  Physical Activity: Insufficiently Active (11/25/2021)  Social Connections: Moderately Isolated (11/25/2021)  Stress: No Stress Concern Present (11/25/2021)  Tobacco Use: Low Risk  (03/14/2022)    Readmission Risk Interventions     No data to display          

## 2022-03-16 NOTE — Progress Notes (Signed)
   Date of Admission:  03/05/2022      ID: Adam Howell is a 62 y.o. male Active Problems:   Anemia in chronic kidney disease   ESRD on dialysis ([place])   Obesity (BMI 30-39.9)   Type 2 diabetes mellitus with ESRD (end-stage renal disease) ([place])   Chronic atrial fibrillation with RVR (HCC)   Hyperlipidemia associated with type 2 diabetes mellitus (HCC)   Chronic systolic CHF (congestive heart failure) (HCC)   Hypokalemia   Hyponatremia   Acute metabolic encephalopathy   Hypotension   Uncontrolled type 2 diabetes mellitus with hypoglycemia, with long-term current use of insulin (HCC)    Subjective: Pt in dialysis He had RT aka today after he consented to it   Medications:   amiodarone  200 mg Oral BID   apixaban  2.5 mg Oral BID   bumetanide  1 mg Oral BID   Chlorhexidine Gluconate Cloth  6 each Topical Q0600   feeding supplement  237 mL Oral BID BM   multivitamin  1 tablet Oral QHS   [START ON 03/17/2022] rosuvastatin  10 mg Oral Daily    Objective: Vital signs in last 24 hours: Temp:  [97.8 F (36.6 C)-98.3 F (36.8 C)] 98.3 F (36.8 C) (01/01 0700) Pulse Rate:  [84-91] 84 (01/01 0700) Resp:  [17-20] 18 (01/01 0700) BP: (116-127)/(55-66) 127/65 (01/01 0700) SpO2:  [99 %-100 %] 100 % (01/01 0700) Weight:  [108.1 kg] 108.1 kg (01/01 0500)   PHYSICAL EXAM:  General: Awake, alert, some confusion Lungs: b/l air entry Heart: irregular. Abdomen: Soft, non-tender,not distended. Bowel sounds normal. No masses Extremities: rt leg Aka- surgical site covered with dressing Lymph: Cervical, supraclavicular normal. Neurologic: Grossly non-focal  Lab Results Recent Labs    03/14/22 0849 03/15/22 0518  WBC 13.3* 9.7  HGB 9.6* 10.0*  HCT 28.7* 30.6*  NA 134* 134*  K 3.6 3.4*  CL 96* 93*  CO2 27 31  BUN 25* 21*  CREATININE 5.68* 4.88*   Liver Panel Recent Labs    03/14/22 0849  ALBUMIN 2.3*  Microbiology: Wound culture- polymicrobial with proteus,  citrobacter and VRE    Assessment/Plan:  Altered mental status due to Acute metabolic encephalopathy- much improved-combination of [place] encephalopathy, not getting dialysis for 5 days, opioid accumulation in the system along with other CNS depressant drugs ( ativan, gabapentin), Co2 retention due to underlying OSA Pt is much better today and able to give consent for Rt AKA - 3 rd day of continuous  dialysis  No seizures  Non healing infected  wound  of the rt shin - with tendon and bone exposed Multiple organisms cultured-  cultured in the wound proteus VRE and Citrobacter-on meropenem and  linezolid Underwent AKA today because of uncontrolled infection and no chance of healing. Can stop meropenem and linezolid in 48 hrs  OSA-need to continue BIPAP  ESRD  Afib on amiodarone  ID will follow him peripherally this weekend- call if needed

## 2022-03-16 NOTE — Progress Notes (Signed)
Physical Therapy Treatment Patient Details Name: Adam Howell MRN: [id number] DOB: [birth year] Today's Date: 03/16/2022   History of Present Illness Pt is a 62 year old male presenting ED due to concerns for increased drainage and foul odor from his wound in spite of ongoing dressings; hospital course complicated by dense encephalopathy, code stroke called 12/26 with MRI impression including "Two small acute or subacute infarcts in the right cerebral white matter"; Pt s/p R AKA 12/29. PMH significant for DM with complications of ESRD on hemodialysis (M/W/F), history of A-fib on Eliquis and amiodarone, HFpEF (EF 35 to 40% 2021), and secondary hyperparathyroidism.    PT Comments    Pt was long sitting in bed upon arriving. [name]. Author questions if he fully understands scope of current situation. He is motivated and cooperative. Tolerated exiting L side of bed via log roll technique + extensive physical assist. Sat EOB for ~ 2 minutes prior to standing 2 x EOB (elevated) with extensive assistance to safely stand EOB prior to advancing to "hopping." [name] 2 x but fatigue quickly resulting in need to pivot on ft versus lifting foot. Chief Strategy Officer issued HEP handout and educated pt on importance of doing to promote strengthening while decreasing likelihood of tightness/contractures. He states understanding. [name]. Acute PT will continue to follow and progress per current POC. Discussed with RN staff and encouraged +2 assistance for additional safety with transfers and OOB activity.     Recommendations for follow up therapy are one component of a multi-disciplinary discharge planning process, led by the attending physician.  Recommendations may be updated based on patient status, additional functional criteria and insurance authorization.  Follow Up Recommendations  Acute inpatient rehab (3hours/day) (great  CIR candidiate)     Assistance Recommended at Discharge Frequent or constant Supervision/Assistance  Patient can return home with the following A lot of help with walking and/or transfers;A lot of help with bathing/dressing/bathroom;Assistance with cooking/housework;Direct supervision/assist for medications management;Direct supervision/assist for financial management;Assist for transportation   Equipment Recommendations  Other (comment) (defer to next level of care)       Precautions / Restrictions Precautions Precautions: Fall Precaution Comments: s/p R AKA Restrictions Weight Bearing Restrictions: Yes RLE Weight Bearing: Non weight bearing     Mobility  Bed Mobility Overal bed mobility: Needs Assistance Bed Mobility: Supine to Sit  Supine to sit: Mod assist    General bed mobility comments: pt rolled L to short sit with mod assist of one. increased time with vcs for step by step sequencing    Transfers Overall transfer level: Needs assistance Equipment used: Rolling walker (2 wheels) Transfers: Sit to/from Stand Sit to Stand: Mod assist, From elevated surface    General transfer comment: Mod assist to stand from elevated bed height. Vcs for technique and sequencing. author protected L knee to prevent buckling however none observed. [name]    Ambulation/Gait Ambulation/Gait assistance: Mod assist Gait Distance (Feet): 3 Feet Assistive device: Rolling walker (2 wheels) Gait Pattern/deviations:  (" hop to") Gait velocity: decreased     General Gait Details: [name] but fatigued quickly resulting in need to pivot the rest of the way to recliner.   Balance Overall balance assessment: Needs assistance Sitting-balance support: Bilateral upper extremity supported Sitting balance-Leahy Scale: Good     Standing balance support: Bilateral  upper extremity supported, During functional activity, Reliant on assistive device  for balance Standing balance-Leahy Scale: Fair Standing balance comment: limited standing endurance however did well with BUE support       Cognition Arousal/Alertness: Awake/alert Behavior During Therapy: Flat affect Overall Cognitive Status: Within Functional Limits for tasks assessed    Orientation Level: Disoriented to, Time     Following Commands: Follows one step commands consistently, Follows one step commands with increased time Safety/Judgement: Decreased awareness of deficits   Problem Solving: Slow processing, Decreased initiation, Requires verbal cues, Requires tactile cues General Comments: [name] or speak voluntarorly. Was cooperative but Pryor Curia questions if he fully understand current scope of situation.               Pertinent Vitals/Pain Pain Assessment Pain Assessment: No/denies pain Pain Score: 0-No pain     PT Goals (current goals can now be found in the care plan section) Acute Rehab PT Goals Patient Stated Goal: none stated Progress towards PT goals: Progressing toward goals    Frequency    7X/week      PT Plan Current plan remains appropriate       AM-PAC PT "6 Clicks" Mobility   Outcome Measure  Help needed turning from your back to your side while in a flat bed without using bedrails?: A Little Help needed moving from lying on your back to sitting on the side of a flat bed without using bedrails?: A Lot Help needed moving to and from a bed to a chair (including a wheelchair)?: A Lot Help needed standing up from a chair using your arms (e.g., wheelchair or bedside chair)?: A Lot Help needed to walk in hospital room?: Total Help needed climbing 3-5 steps with a railing? : Total 6 Click Score: 11    End of Session   Activity Tolerance: Patient tolerated treatment well;Patient limited by fatigue Patient left: in chair;with call bell/phone within reach;with chair alarm  set Nurse Communication: Mobility status PT Visit Diagnosis: Muscle weakness (generalized) (M62.81);Difficulty in walking, not elsewhere classified (R26.2);Pain Pain - Right/Left: Right Pain - part of body: Leg     Time: 1212-1233 PT Time Calculation (min) (ACUTE ONLY): 21 min  Charges:  $Therapeutic Activity: 8-22 mins                     Julaine Fusi PTA 03/16/22, 12:59 PM

## 2022-03-16 NOTE — Progress Notes (Signed)
Subjective  - POD #3  Says his stomach does not hurt as bad.   Physical Exam:  Incision healing appropriately.       Assessment/Plan:  POD #3  Routine postop amputation care with physical therapy and Occupational Therapy.  Will need to consider inpatient rehab  Adam Howell 03/16/2022 6:18 PM --  Vitals:   03/16/22 0700 03/16/22 1615  BP: 127/65 (!) 111/59  Pulse: 84 88  Resp: 18 18  Temp: 98.3 F (36.8 C) 98.1 F (36.7 C)  SpO2: 100% 97%   No intake or output data in the 24 hours ending 03/16/22 1818   Laboratory CBC    Component Value Date/Time   WBC 9.7 03/15/2022 0518   HGB 10.0 (L) 03/15/2022 0518   HGB 14.4 02/03/2022 1441   HCT 30.6 (L) 03/15/2022 0518   HCT 44.0 02/03/2022 1441   PLT 336 03/15/2022 0518   PLT 200 02/03/2022 1441    BMET    Component Value Date/Time   NA 134 (L) 03/15/2022 0518   NA 136 02/03/2022 1441   K 3.4 (L) 03/15/2022 0518   CL 93 (L) 03/15/2022 0518   CO2 31 03/15/2022 0518   GLUCOSE 113 (H) 03/15/2022 0518   BUN 21 (H) 03/15/2022 0518   BUN 35 (H) 02/03/2022 1441   CREATININE 4.88 (H) 03/15/2022 0518   CALCIUM 8.3 (L) 03/15/2022 0518   GFRNONAA 13 (L) 03/15/2022 0518    COAG Lab Results  Component Value Date   INR 1.2 03/05/2022   No results found for: "PTT"  Antibiotics Anti-infectives (From admission, onward)    Start     Dose/Rate Route Frequency Ordered Stop   03/13/22 1200  vancomycin (VANCOCIN) IVPB 1000 mg/200 mL premix  Status:  Discontinued        1,000 mg 200 mL/hr over 60 Minutes Intravenous Every M-W-F (Hemodialysis) 03/11/22 1042 03/11/22 1105   03/13/22 0733  ceFAZolin (ANCEF) IVPB 2g/100 mL premix  Status:  Discontinued        2 g 200 mL/hr over 30 Minutes Intravenous 30 min pre-op 03/13/22 0734 03/13/22 1151   03/12/22 2200  linezolid (ZYVOX) IVPB 600 mg  Status:  Discontinued        600 mg 300 mL/hr over 60 Minutes Intravenous Every 12 hours 03/12/22 1530 03/15/22 0943    03/12/22 1800  meropenem (MERREM) 500 mg in sodium chloride 0.9 % 100 mL IVPB  Status:  Discontinued        500 mg 200 mL/hr over 30 Minutes Intravenous Every 24 hours 03/12/22 1532 03/15/22 0943   03/11/22 1400  acyclovir (ZOVIRAX) 480 mg in dextrose 5 % 250 mL IVPB  Status:  Discontinued        5 mg/kg  96.3 kg (Adjusted) 259.6 mL/hr over 60 Minutes Intravenous Every 24 hours 03/11/22 1042 03/11/22 1057   03/11/22 1400  vancomycin (VANCOCIN) IVPB 1000 mg/200 mL premix  Status:  Discontinued       See Hyperspace for full Linked Orders Report.   1,000 mg 200 mL/hr over 60 Minutes Intravenous  Once 03/11/22 1042 03/11/22 1100   03/11/22 1200  vancomycin (VANCOREADY) IVPB 1500 mg/300 mL  Status:  Discontinued       See Hyperspace for full Linked Orders Report.   1,500 mg 150 mL/hr over 120 Minutes Intravenous  Once 03/11/22 1042 03/11/22 1100   03/11/22 1130  ampicillin (OMNIPEN) 2 g in sodium chloride 0.9 % 100 mL IVPB  Status:  Discontinued  2 g 300 mL/hr over 20 Minutes Intravenous Every 12 hours 03/11/22 1042 03/11/22 1057   03/11/22 1130  cefTRIAXone (ROCEPHIN) 2 g in sodium chloride 0.9 % 100 mL IVPB  Status:  Discontinued        2 g 200 mL/hr over 30 Minutes Intravenous Every 12 hours 03/11/22 1042 03/11/22 1057   03/06/22 1200  vancomycin (VANCOCIN) IVPB 1000 mg/200 mL premix  Status:  Discontinued        1,000 mg 200 mL/hr over 60 Minutes Intravenous Every M-W-F (Hemodialysis) 03/06/22 0117 03/09/22 1121   03/06/22 0400  piperacillin-tazobactam (ZOSYN) IVPB 2.25 g  Status:  Discontinued        2.25 g 100 mL/hr over 30 Minutes Intravenous Every 8 hours 03/06/22 0153 03/12/22 1530   03/06/22 0130  piperacillin-tazobactam (ZOSYN) IVPB 2.25 g  Status:  Discontinued        2.25 g 100 mL/hr over 30 Minutes Intravenous Every 8 hours 03/06/22 0118 03/06/22 0153   03/06/22 0115  vancomycin (VANCOCIN) IVPB 1000 mg/200 mL premix        1,000 mg 200 mL/hr over 60 Minutes Intravenous   Once 03/06/22 0114 03/06/22 0316   03/05/22 2100  vancomycin (VANCOREADY) IVPB 1500 mg/300 mL  Status:  Discontinued       See Hyperspace for full Linked Orders Report.   1,500 mg 150 mL/hr over 120 Minutes Intravenous  Once 03/05/22 1828 03/05/22 1921   03/05/22 1930  vancomycin (VANCOREADY) IVPB 1500 mg/300 mL        1,500 mg 150 mL/hr over 120 Minutes Intravenous  Once 03/05/22 1921 03/05/22 2231   03/05/22 1830  vancomycin (VANCOREADY) IVPB 1500 mg/300 mL  Status:  Discontinued        1,500 mg 150 mL/hr over 120 Minutes Intravenous  Once 03/05/22 1825 03/05/22 1828   03/05/22 1830  piperacillin-tazobactam (ZOSYN) IVPB 3.375 g        3.375 g 100 mL/hr over 30 Minutes Intravenous  Once 03/05/22 1825 03/05/22 2027   03/05/22 1830  vancomycin (VANCOCIN) IVPB 1000 mg/200 mL premix  Status:  Discontinued       See Hyperspace for full Linked Orders Report.   1,000 mg 200 mL/hr over 60 Minutes Intravenous  Once 03/05/22 1828 03/05/22 1921        V. Leia Alf, M.D., [facility] [place] Office: [phone] Pager:  [phone]

## 2022-03-16 NOTE — Progress Notes (Signed)
[facility], [place] 03/16/22  Subjective:   LOS: 11  Patient known to our practice from previous admissions.  Patient admitted for worsening right lower extremity ulcer.  Currently getting IV broad-spectrum antibiotics.   Patient underwent irrigation and debridement of right lower extremity and placement of wound VAC.  Patient seen resting quietly, breakfast tray at bedside Alert and oriented Patient has no complaints to offer  Objective:  Vital signs in last 24 hours:  Temp:  [97.8 F (36.6 C)-98.3 F (36.8 C)] 98.3 F (36.8 C) (01/01 0700) Pulse Rate:  [84-91] 84 (01/01 0700) Resp:  [17-20] 18 (01/01 0700) BP: (116-127)/(55-66) 127/65 (01/01 0700) SpO2:  [99 %-100 %] 100 % (01/01 0700) Weight:  [108.1 kg] 108.1 kg (01/01 0500)  Weight change: 4.3 kg Filed Weights   03/14/22 1241 03/15/22 0500 03/16/22 0500  Weight: 103 kg 108.6 kg 108.1 kg    Intake/Output:   No intake or output data in the 24 hours ending 03/16/22 1011    Physical Exam: General: NAD, laying in the bed  HEENT Moist oral mucous membranes  Pulm/lungs Normal breathing effort  CVS/Heart No rub  Abdomen:  Soft, nontender, nondistended  Extremities: Rt AKA, no edema  Neurologic: Alert and oriented  Skin: No acute rashes  Access: AV fistula       Basic Metabolic Panel:  Recent Labs  Lab 03/10/22 1706 03/12/22 0540 03/14/22 0849 03/15/22 0518  NA 133* 136 134* 134*  K 5.2* 4.6 3.6 3.4*  CL 93* 96* 96* 93*  CO2 25 27 27 31   GLUCOSE 78 88 143* 113*  BUN 53* 37* 25* 21*  CREATININE 12.03* 8.59* 5.68* 4.88*  CALCIUM 8.6* 8.4* 8.3* 8.3*  MG  --   --   --  1.9  PHOS  --   --  4.8* 3.9      CBC: Recent Labs  Lab 03/10/22 1706 03/12/22 0540 03/14/22 0849 03/15/22 0518  WBC 7.9 7.9 13.3* 9.7  HGB 10.0* 10.3* 9.6* 10.0*  HCT 31.5* 32.2* 28.7* 30.6*  MCV 88.5 88.2 83.7 85.5  PLT 353 341 354 336       Lab Results  Component Value Date   HEPBSAG NON  REACTIVE 03/06/2022      Microbiology:  Recent Results (from the past 240 hour(s))  Surgical PCR screen     Status: None   Collection Time: 03/06/22 11:31 PM   Specimen: Nasal Mucosa; Nasal Swab  Result Value Ref Range Status   MRSA, PCR NEGATIVE NEGATIVE Final   Staphylococcus aureus NEGATIVE NEGATIVE Final    Comment: (NOTE) The Xpert SA Assay (FDA approved for NASAL specimens in patients 29 years of age and older), is one component of a comprehensive surveillance program. It is not intended to diagnose infection nor to guide or monitor treatment. Performed at [facility], [address] [place], [place] [address]   Aerobic/Anaerobic Culture w Gram Stain (surgical/deep wound)     Status: None   Collection Time: 03/07/22  9:50 AM   Specimen: Wound  Result Value Ref Range Status   Specimen Description   Final    WOUND Performed at [facility], [address], [place], [place] [address]    Special Requests   Final    right lower extremity wound Performed at [facility], [place],  [address]    Gram Stain   Final    FEW GRAM NEGATIVE RODS RARE WBC PRESENT, PREDOMINANTLY PMN  Culture   Final    FEW CITROBACTER KOSERI FEW ENTEROCOCCUS FAECIUM FEW PROTEUS MIRABILIS VANCOMYCIN RESISTANT ENTEROCOCCUS ISOLATED NO ANAEROBES ISOLATED Performed at [facility], [address] [place], [place] [address]    Report Status 03/12/2022 FINAL  Final   Organism ID, Bacteria PROTEUS MIRABILIS  Final   Organism ID, Bacteria CITROBACTER KOSERI  Final   Organism ID, Bacteria ENTEROCOCCUS FAECIUM  Final      Susceptibility   Citrobacter koseri - MIC*    CEFAZOLIN 16 SENSITIVE Sensitive     CEFEPIME 0.5 SENSITIVE Sensitive     CEFTAZIDIME 2 SENSITIVE Sensitive     CEFTRIAXONE 0.5 SENSITIVE Sensitive     CIPROFLOXACIN <=0.25 SENSITIVE Sensitive     GENTAMICIN <=1 SENSITIVE Sensitive     IMIPENEM <=0.25 SENSITIVE  Sensitive     TRIMETH/SULFA <=20 SENSITIVE Sensitive     PIP/TAZO 64 INTERMEDIATE Intermediate     * FEW CITROBACTER KOSERI   Enterococcus faecium - MIC*    AMPICILLIN >=32 RESISTANT Resistant     VANCOMYCIN >=32 RESISTANT Resistant     GENTAMICIN SYNERGY SENSITIVE Sensitive     LINEZOLID 2 SENSITIVE Sensitive     * FEW ENTEROCOCCUS FAECIUM   Proteus mirabilis - MIC*    AMPICILLIN <=2 SENSITIVE Sensitive     CEFAZOLIN 8 SENSITIVE Sensitive     CEFEPIME 0.25 SENSITIVE Sensitive     CEFTAZIDIME <=1 SENSITIVE Sensitive     CEFTRIAXONE 0.5 SENSITIVE Sensitive     CIPROFLOXACIN <=0.25 SENSITIVE Sensitive     GENTAMICIN <=1 SENSITIVE Sensitive     IMIPENEM 2 SENSITIVE Sensitive     TRIMETH/SULFA <=20 SENSITIVE Sensitive     AMPICILLIN/SULBACTAM <=2 SENSITIVE Sensitive     PIP/TAZO <=4 SENSITIVE Sensitive     * FEW PROTEUS MIRABILIS    Coagulation Studies: No results for input(s): "LABPROT", "INR" in the last 72 hours.   Urinalysis: No results for input(s): "COLORURINE", "LABSPEC", "PHURINE", "GLUCOSEU", "HGBUR", "BILIRUBINUR", "KETONESUR", "PROTEINUR", "UROBILINOGEN", "NITRITE", "LEUKOCYTESUR" in the last 72 hours.  Invalid input(s): "APPERANCEUR"    Imaging: No results found.   Medications:    anticoagulant sodium citrate     levETIRAcetam      amiodarone  200 mg Oral BID   apixaban  2.5 mg Oral BID   bumetanide  1 mg Oral BID   Chlorhexidine Gluconate Cloth  6 each Topical Q0600   feeding supplement  237 mL Oral BID BM   multivitamin  1 tablet Oral QHS   rosuvastatin  40 mg Oral Daily   acetaminophen **OR** acetaminophen, alteplase, anticoagulant sodium citrate, dextrose, heparin, lidocaine (PF), lidocaine-prilocaine, ondansetron **OR** ondansetron (ZOFRAN) IV, ondansetron (ZOFRAN) IV, oxyCODONE-acetaminophen, pentafluoroprop-tetrafluoroeth  Assessment/ Plan:  62 y.o. male with diabetes with complications, ESRD, atrial fibrillation requiring Eliquis, amiodarone,  congestive heart failure, secondary hyperparathyroidism, chronic nonhealing right lower extremity wound was admitted on 03/05/2022 for  Active Problems:   Anemia in chronic kidney disease   ESRD on dialysis ([place])   Obesity (BMI 30-39.9)   Type 2 diabetes mellitus with ESRD (end-stage renal disease) (HCC)   Chronic atrial fibrillation with RVR (HCC)   Hyperlipidemia associated with type 2 diabetes mellitus (HCC)   Chronic systolic CHF (congestive heart failure) (HCC)   Hypokalemia   Hyponatremia   Acute metabolic encephalopathy   Hypotension   Uncontrolled type 2 diabetes mellitus with hypoglycemia, with long-term current use of insulin ([place])  Unspecified open wound, right lower leg, initial encounter [S81.801A] Necrotizing fasciitis of lower leg (  [place]) [M72.6]  #. ESRD [facility]/MWF/left aVF   Next treatment scheduled for Tuesday.  Will monitor discharge plan, which will improved rehab placement.  #. Anemia of CKD  Lab Results  Component Value Date   HGB 10.0 (L) 03/15/2022   Hemoglobin remains acceptable.  No need for ESA's at this time.  #. Secondary hyperparathyroidism of renal origin N 25.81   No results found for: "PTH" Lab Results  Component Value Date   PHOS 3.9 03/15/2022    Bone minerals acceptable at this time.  #.  Nonhealing right lower extremity ulcer with wound infection -Completed antibiotic therapy. -Irrigation and debridement with wound vac placement with undesirable results. Rt AKA completed on 03/13/22. Vascular continues to follow  # Acute metabolic encephalopathy.  MRI head suggestive of subacute infarct however neurology has disputed this finding.  EEG performed, suggestive of metabolic encephalopathy.  Resolved   LOS: [address] 1/1/202410:11 [place], [place]

## 2022-03-16 NOTE — Progress Notes (Signed)
Occupational Therapy Treatment Patient Details Name: Adam Howell MRN: [id number] DOB: 03/16/1961 Today's Date: 03/16/2022   History of present illness Pt is a 62 year old male presenting ED due to concerns for increased drainage and foul odor from his wound in spite of ongoing dressings; hospital course complicated by dense encephalopathy, code stroke called 12/26 with MRI impression including "Two small acute or subacute infarcts in the right cerebral white matter"; Pt s/p R AKA 12/29. PMH significant for DM with complications of ESRD on hemodialysis (M/W/F), history of A-fib on Eliquis and amiodarone, HFpEF (EF 35 to 40% 2021), and secondary hyperparathyroidism.   OT comments  Chart reviewed, pt greeted in chair agreeable to OT tx session. Tx session targeted improving functional activity tolerance to facilitate improved ADL task completion. Pt performed 8 STS, 5 initially with RW with MAX A from chair with pt able to clear buttocks to approx 1/2 full upright positioning with step by step verbal/tactile cues. Improved with pt able to clear buttocks to approx 3/4 full upright positioning with RW with new socks, step by step verbal/tactile cues. Grooming tasks completed with SET UP, UB dressing with MIN A, LB dressing with MOD A. Improvements noted in dressing tasks, tolerance for activity. Lunch tray present, pt requests to eat lunch at this time. OT will continue to follow acutely, discharge recommendation remains appropriate.    Recommendations for follow up therapy are one component of a multi-disciplinary discharge planning process, led by the attending physician.  Recommendations may be updated based on patient status, additional functional criteria and insurance authorization.    Follow Up Recommendations  Acute inpatient rehab (3hours/day)     Assistance Recommended at Discharge Intermittent Supervision/Assistance  Patient can return home with the following  A lot of help with  walking and/or transfers;A lot of help with bathing/dressing/bathroom;Direct supervision/assist for financial management;Assistance with cooking/housework;Direct supervision/assist for medications management   Equipment Recommendations  Other (comment) (per next venue of care)    Recommendations for Other Services      Precautions / Restrictions Precautions Precautions: Fall Precaution Comments: s/p R AKA Restrictions Weight Bearing Restrictions: Yes RLE Weight Bearing: Non weight bearing       Mobility Bed Mobility               General bed mobility comments: NT in recliner pre/post session    Transfers Overall transfer level: Needs assistance Equipment used: Rolling walker (2 wheels) Transfers: Sit to/from Stand Sit to Stand: Max assist (from chair)                 Balance Overall balance assessment: Needs assistance Sitting-balance support: Bilateral upper extremity supported Sitting balance-Leahy Scale: Good     Standing balance support: Bilateral upper extremity supported, During functional activity, Reliant on assistive device for balance Standing balance-Leahy Scale: Fair                             ADL either performed or assessed with clinical judgement   ADL Overall ADL's : Needs assistance/impaired     Grooming: Wash/dry hands;Wash/dry face;Sitting;Set up           Upper Body Dressing : With caregiver independent assisting Upper Body Dressing Details (indicate cue type and reason): gown seated in chair Lower Body Dressing: Moderate assistance Lower Body Dressing Details (indicate cue type and reason): pt doff sock with MIN A, donn with MOD A  General ADL Comments: STS 5x with MAX A to approx 1/2 full upright standing with RW; changed socked, additional 3 attempts with pt able to stand up to 3/4 full upright standing with RW    Extremity/Trunk Assessment              Vision       Perception      Praxis      Cognition Arousal/Alertness: Awake/alert Behavior During Therapy: Flat affect Overall Cognitive Status: No family/caregiver present to determine baseline cognitive functioning Area of Impairment: Orientation, Following commands, Safety/judgement, Awareness, Problem solving                 Orientation Level: Disoriented to, Time     Following Commands: Follows one step commands consistently, Follows one step commands with increased time Safety/Judgement: Decreased awareness of deficits   Problem Solving: Slow processing, Decreased initiation, Requires verbal cues, Requires tactile cues          Exercises      Shoulder Instructions       General Comments      Pertinent Vitals/ Pain       Pain Assessment Pain Assessment: No/denies pain  Home Living                                          Prior Functioning/Environment              Frequency  Min 3X/week        Progress Toward Goals  OT Goals(current goals can now be found in the care plan section)  Progress towards OT goals: Progressing toward goals     Plan Discharge plan remains appropriate    Co-evaluation                 AM-PAC OT "6 Clicks" Daily Activity     Outcome Measure   Help from another person eating meals?: None Help from another person taking care of personal grooming?: None Help from another person toileting, which includes using toliet, bedpan, or urinal?: A Lot Help from another person bathing (including washing, rinsing, drying)?: A Lot Help from another person to put on and taking off regular upper body clothing?: A Little Help from another person to put on and taking off regular lower body clothing?: A Lot 6 Click Score: 17    End of Session Equipment Utilized During Treatment: Rolling walker (2 wheels)  OT Visit Diagnosis: Unsteadiness on feet (R26.81);Muscle weakness (generalized) (M62.81)   Activity Tolerance Patient tolerated  treatment well   Patient Left in chair;with call bell/phone within reach;with chair alarm set   Nurse Communication          Time: 6269-4854 OT Time Calculation (min): 14 min  Charges: OT General Charges $OT Visit: 1 Visit OT Treatments $Therapeutic Activity: 8-22 mins  Shanon Payor, OTD OTR/L  03/16/22, 1:49 PM

## 2022-03-16 NOTE — NC FL2 (Signed)
[place] LEVEL OF CARE FORM     IDENTIFICATION  Patient Name: Adam Howell Birthdate: Feb 14, 1961 Sex: male Admission Date (Current Location): 03/05/2022  [facility] and [place] Number:  Engineering geologist and Address:  [facility], [address], [place], [place] [address]      Provider Number: [id number]  Attending Physician Name and Address:  Sharen Hones, MD  Relative Name and Phone Number:       Current Level of Care: Hospital Recommended Level of Care: [place] Prior Approval Number:    Date Approved/Denied:   PASRR Number: Pending  Discharge Plan: SNF    Current Diagnoses: Patient Active Problem List   Diagnosis Date Noted   S/P AKA (above knee amputation), right ([place]) 03/15/2022   Uncontrolled type 2 diabetes mellitus with hypoglycemia, with long-term current use of insulin ([place]) 46/80/3212   Acute metabolic encephalopathy 24/82/5003   Hypotension 03/08/2022   Hypokalemia 03/07/2022   Hyponatremia 70/48/8891   Chronic systolic CHF (congestive heart failure) ([place]) 03/06/2022   Controlled substance agreement signed 04/10/2021   Ulcer of right lower extremity ([place]) 02/02/2020   Other thrombophilia ([place]) 01/19/2020   Chronic atrial fibrillation with RVR ([place]) 11/11/2019   Hyperlipidemia associated with type 2 diabetes mellitus ([place]) 11/11/2019   Erectile dysfunction 11/11/2019   Heart failure with reduced ejection fraction ([place]) 10/26/2019   Type 2 diabetes mellitus with ESRD (end-stage renal disease) ([place]) 10/04/2019   Chronic pain syndrome 10/04/2019   Insomnia 10/04/2019   Atherosclerotic heart disease of native coronary artery without angina pectoris 09/01/2019   Hypertensive heart and kidney disease with heart failure and chronic kidney disease stage V ([place]) 09/01/2019   Obesity (BMI 30-39.9) 09/01/2019   Anemia in chronic kidney disease 07/25/2019   ESRD on dialysis ([place]) 07/25/2019    Secondary hyperparathyroidism of renal origin ([place]) 07/25/2019    Orientation RESPIRATION BLADDER Height & Weight     Self, Time, Situation, Place  Normal Continent Weight: 238 lb 5.1 oz (108.1 kg) Height:  6\' 3"  (190.5 cm)  BEHAVIORAL SYMPTOMS/MOOD NEUROLOGICAL BOWEL NUTRITION STATUS   (None)  (None) Continent Diet  AMBULATORY STATUS COMMUNICATION OF NEEDS Skin   Limited Assist Verbally Surgical wounds, Other (Comment) (Incision on right leg: compression wrap, gauze, ABD.)                       Personal Care Assistance Level of Assistance  Bathing, Feeding, Dressing Bathing Assistance: Maximum assistance Feeding assistance: Limited assistance Dressing Assistance: Maximum assistance     Functional Limitations Info  Sight, Hearing, Speech Sight Info: Adequate Hearing Info: Adequate Speech Info: Adequate    SPECIAL CARE FACTORS FREQUENCY  PT (By licensed PT), OT (By licensed OT)     PT Frequency: 5 x week OT Frequency: 5 x week            Contractures Contractures Info: Not present    Additional Factors Info  Code Status, Allergies Code Status Info: Full code Allergies Info: Tramadol           Current Medications (03/16/2022):  This is the current hospital active medication list Current Facility-Administered Medications  Medication Dose Route Frequency Provider Last Rate Last Admin   acetaminophen (TYLENOL) tablet 650 mg  650 mg Oral Q6H PRN Algernon Huxley, MD   650 mg at 03/06/22 1604   Or   acetaminophen (TYLENOL) suppository 650 mg  650 mg Rectal Q6H PRN Algernon Huxley, MD  alteplase (CATHFLO ACTIVASE) injection 2 mg  2 mg Intracatheter Once PRN Colon Flattery, NP       amiodarone (PACERONE) tablet 200 mg  200 mg Oral BID Algernon Huxley, MD   200 mg at 03/16/22 0930   anticoagulant sodium citrate solution 5 mL  5 mL Intracatheter PRN Algernon Huxley, MD       apixaban Arne Cleveland) tablet 2.5 mg  2.5 mg Oral BID Algernon Huxley, MD   2.5 mg at 03/16/22 0930    bumetanide (BUMEX) tablet 1 mg  1 mg Oral BID Algernon Huxley, MD   1 mg at 03/16/22 0930   Chlorhexidine Gluconate Cloth 2 % PADS 6 each  6 each Topical Q0600 Algernon Huxley, MD   6 each at 03/15/22 0911   dextrose 50 % solution 12.5 g  12.5 g Intravenous PRN Algernon Huxley, MD       feeding supplement (ENSURE ENLIVE / ENSURE PLUS) liquid 237 mL  237 mL Oral BID BM Sharen Hones, MD   237 mL at 03/15/22 1458   heparin injection 1,000 Units  1,000 Units Intracatheter PRN Breeze, Benancio Deeds, NP       lidocaine (PF) (XYLOCAINE) 1 % injection 5 mL  5 mL Intradermal PRN Breeze, Benancio Deeds, NP       lidocaine-prilocaine (EMLA) cream 1 Application  1 Application Topical PRN Colon Flattery, NP       multivitamin (RENA-VIT) tablet 1 tablet  1 tablet Oral QHS Algernon Huxley, MD   1 tablet at 03/15/22 2202   ondansetron (ZOFRAN) tablet 4 mg  4 mg Oral Q6H PRN Algernon Huxley, MD       Or   ondansetron (ZOFRAN) injection 4 mg  4 mg Intravenous Q6H PRN Algernon Huxley, MD       oxyCODONE-acetaminophen (PERCOCET/ROXICET) 5-325 MG per tablet 1-2 tablet  1-2 tablet Oral Q4H PRN Sharen Hones, MD   2 tablet at 03/16/22 5625   pentafluoroprop-tetrafluoroeth (GEBAUERS) aerosol 1 Application  1 Application Topical PRN Colon Flattery, NP   1 Application at 63/89/37 1547   [START ON 03/17/2022] rosuvastatin (CRESTOR) tablet 10 mg  10 mg Oral Daily Sharen Hones, MD         Discharge Medications: Please see discharge summary for a list of discharge medications.  Relevant Imaging Results:  Relevant Lab Results:   Additional Information SS#: [id number]. [place] MWF 7:00 am.  Candie Chroman, LCSW

## 2022-03-16 NOTE — TOC Progression Note (Signed)
Transition of Care [facility]) - Progression Note    Patient Details  Name: Adam Howell MRN: [id number] Date of Birth: May 11, 1960  Transition of Care [facility]) CM/SW [place], LCSW Phone Number: 03/16/2022, 11:37 AM  Clinical Narrative:   Went by room to discuss CIR vs SNF. Patient asleep and did not wake up to [place] calling his name. Will try again later.  Expected Discharge Plan: [place] Barriers to Discharge: Continued Medical Work up  Expected Discharge Plan and Services                                               Social Determinants of Health (SDOH) Interventions [place]: Food Insecurity Present (03/06/2022)  Housing: Low Risk  (03/06/2022)  Transportation Needs: No Transportation Needs (03/06/2022)  Utilities: Not At Risk (03/06/2022)  Alcohol Screen: Low Risk  (11/25/2021)  Depression (PHQ2-9): Low Risk  (11/25/2021)  Financial Resource Strain: Low Risk  (11/25/2021)  Physical Activity: Insufficiently Active (11/25/2021)  Social Connections: Moderately Isolated (11/25/2021)  Stress: No Stress Concern Present (11/25/2021)  Tobacco Use: Low Risk  (03/14/2022)    Readmission Risk Interventions     No data to display

## 2022-03-17 ENCOUNTER — Ambulatory Visit: Payer: Medicare HMO | Admitting: Nurse Practitioner

## 2022-03-17 DIAGNOSIS — N186 End stage renal disease: Secondary | ICD-10-CM | POA: Diagnosis not present

## 2022-03-17 DIAGNOSIS — L089 Local infection of the skin and subcutaneous tissue, unspecified: Secondary | ICD-10-CM | POA: Diagnosis not present

## 2022-03-17 DIAGNOSIS — T148XXA Other injury of unspecified body region, initial encounter: Secondary | ICD-10-CM | POA: Diagnosis not present

## 2022-03-17 DIAGNOSIS — G9341 Metabolic encephalopathy: Secondary | ICD-10-CM | POA: Diagnosis not present

## 2022-03-17 LAB — GLUCOSE, CAPILLARY
Glucose-Capillary: 138 mg/dL — ABNORMAL HIGH (ref 70–99)
Glucose-Capillary: 142 mg/dL — ABNORMAL HIGH (ref 70–99)

## 2022-03-17 LAB — PHOSPHORUS: Phosphorus: 5 mg/dL — ABNORMAL HIGH (ref 2.5–4.6)

## 2022-03-17 LAB — BASIC METABOLIC PANEL
Anion gap: 11 (ref 5–15)
BUN: 48 mg/dL — ABNORMAL HIGH (ref 6–20)
CO2: 29 mmol/L (ref 22–32)
Calcium: 8.1 mg/dL — ABNORMAL LOW (ref 8.9–10.3)
Chloride: 93 mmol/L — ABNORMAL LOW (ref 98–111)
Creatinine, Ser: 8.72 mg/dL — ABNORMAL HIGH (ref 0.61–1.24)
GFR, Estimated: 6 mL/min — ABNORMAL LOW (ref >60)
Glucose, Bld: 113 mg/dL — ABNORMAL HIGH (ref 70–99)
Potassium: 3.6 mmol/L (ref 3.5–5.1)
Sodium: 133 mmol/L — ABNORMAL LOW (ref 135–145)

## 2022-03-17 LAB — CBC
HCT: 20.8 % — ABNORMAL LOW (ref 39.0–52.0)
Hemoglobin: 6.6 g/dL — ABNORMAL LOW (ref 13.0–17.0)
MCH: 27.7 pg (ref 26.0–34.0)
MCHC: 31.7 g/dL (ref 30.0–36.0)
MCV: 87.4 fL (ref 80.0–100.0)
Platelets: 226 10*3/uL (ref 150–400)
RBC: 2.38 MIL/uL — ABNORMAL LOW (ref 4.22–5.81)
RDW: 16.3 % — ABNORMAL HIGH (ref 11.5–15.5)
WBC: 6.3 10*3/uL (ref 4.0–10.5)
nRBC: 0 % (ref 0.0–0.2)

## 2022-03-17 LAB — HEMOGLOBIN AND HEMATOCRIT, BLOOD
HCT: 27.7 % — ABNORMAL LOW (ref 39.0–52.0)
Hemoglobin: 8.8 g/dL — ABNORMAL LOW (ref 13.0–17.0)

## 2022-03-17 LAB — MAGNESIUM: Magnesium: 2.3 mg/dL (ref 1.7–2.4)

## 2022-03-17 LAB — SURGICAL PATHOLOGY

## 2022-03-17 MED ORDER — ACETAMINOPHEN 325 MG PO TABS
ORAL_TABLET | ORAL | Status: AC
  Administered 2022-03-17: 650 mg via ORAL
  Filled 2022-03-17: qty 2

## 2022-03-17 MED ORDER — PENTAFLUOROPROP-TETRAFLUOROETH EX AERO
INHALATION_SPRAY | CUTANEOUS | Status: AC
  Administered 2022-03-17: 1 via TOPICAL
  Filled 2022-03-17: qty 30

## 2022-03-17 MED ORDER — EPOETIN ALFA 10000 UNIT/ML IJ SOLN: 10000.0000 [IU] | Freq: Once | INTRAMUSCULAR | Status: DC

## 2022-03-17 MED ORDER — LOPERAMIDE HCL 2 MG PO CAPS
2.0000 mg | ORAL_CAPSULE | ORAL | Status: DC | PRN
  Administered 2022-03-17: 2 mg via ORAL
  Filled 2022-03-17: qty 1

## 2022-03-17 NOTE — Progress Notes (Signed)
   Patient had 2 episodes of watery bowel movement during HD. Hemodialysis NP mand floor nurse were notified.   Initial Hgb: 6.6. NP ordered another hgb determination but the result was high according to lab and they requested another sample.   Hgb: 8.8. NP ordered Epogen to be started tomorrow.

## 2022-03-17 NOTE — Progress Notes (Signed)
Progress Note   Patient: Adam Howell BEM:[id number] DOB: October 17, 1960 DOA: 03/05/2022     12 DOS: the patient was seen and examined on 03/17/2022   Brief hospital course:  Adam Howell is a 62 y.o. male with medical history significant for diabetes mellitus with complications of end-stage renal disease on hemodialysis (M/W/F), history of A-fib on Eliquis and amiodarone, HFpEF (EF 35 to 40% 2021), secondary hyperparathyroidism with chronic nonhealing right lower extremity wound for which he was hospitalized for debridement and IV antibiotics from 10/31 to 11/6.  He was sent to the ED by his home health nurse due to concerns for increased drainage and foul odor from the wound in spite of ongoing dressings.  Patient also reports increased pain of the lower leg.   MRI right tib-fib suggesting myositis without osteomyelitis. Patient was started on Zosyn and vancomycin.  Patient was taken to the OR for debridement on 12/23.  Wound culture only grew out gram-negative rods, antibiotics switched to Zosyn only.  Patient had a prolonged episode of altered mental status postop, currently improved.  Had another episode of decreased responsiveness on 12/26.  Seen by neurology, most likely metabolic encephalopathy.  MRI of the brain reported as new stroke, neurology has reviewed it, does not believe this is stroke. 12/27.  Patient has dense encephalopathy, after discussion with neurology, ID, determined this is mainly metabolic in origin. 12/29.  Patient had right-sided above-knee amputation today.  Already waking up postop.  12/31.  Condition much improved, antibiotic completed.  Pending CIR transfer.      Assessment and Plan: Acute metabolic encephalopathy. Hypotension. Patient had a prolonged altered mental status post op due to end-stage renal disease.  Blood pressure was low overnight, received fluid bolus.  Mental status finally improved. Patient had another episode of mental status change am  of 12/26, he was confused, minimally responsive.  Code stroke was called, seen by Dr. Cheral Marker, does not appear to have stroke, I reviewed the CT images together with exam, he appears to have some old stroke, no new stroke at this time.  MRI was also performed, reportedly a new stroke, but reviewed with Dr. Cheral Marker, does not appear to be stroke.  EEG is consistent with metabolic encephalopathy. Mental status has normalized. Condition resolved after AKA.   Wound infection of chronic right lower extremity ulcer due to polymicrobial infection Blood culture negative. Patient had a debridement performed 12/23.  Gram stain shows gram negative rods, culture only has gram-negative rods.  Discontinue vancomycin, continue Zosyn for now. Wound culture still not finalized, appears to be largely microbial, but all gram-negative. Patient appears to have nonsalvageable right lower extremity with a severe wound.   Patient finally agreeable for amputation, he had above-knee amputation.   The wound culture finalized with Proteus mirabilis, Citrobacter, and Enterococcus faecium. Antibiotics completed.   Chronic systolic CHF (congestive heart failure) (HCC) Condition stable without volume overload.   Type 2 diabetes mellitus with ESRD (end-stage renal disease) (HCC) Hypoglycemia. Hypoglycemia resolved after better appetite.   ESRD on dialysis [facility]) Patient does not have volume overload, followed by nephrology for scheduled dialysis.   Chronic Atrial fibrillation (HCC) Continue Eliquis and amiodarone   Anemia in chronic kidney disease Stable.   Hyperlipidemia associated with type 2 diabetes mellitus ([place]) Continue rosuvastatin      Subjective:  Patient doing well today, good appetite without shortness of breath.  No confusion.  Physical Exam: Vitals:   03/17/22 1200 03/17/22 1230 03/17/22 1300 03/17/22 1307  BP: 133/78 (!) 160/78 (!) 145/76 134/75  Pulse: 87 84 80 80  Resp: 15 14 20 12   Temp:     98.6 F (37 C)  TempSrc:    Oral  SpO2: 97% 100% 100% 100%  Weight:      Height:       General exam: Appears calm and comfortable  Respiratory system: Clear to auscultation. Respiratory effort normal. Cardiovascular system: S1 & S2 heard, RRR. No JVD, murmurs, rubs, gallops or clicks. No pedal edema. Gastrointestinal system: Abdomen is nondistended, soft and nontender. No organomegaly or masses felt. Normal bowel sounds heard. Central nervous system: Alert and oriented. No focal neurological deficits. Extremities: Right AKA. Skin: No rashes, lesions or ulcers Psychiatry: Judgement and insight appear normal. Mood & affect appropriate. ' Data Reviewed:   Lab results reviewed.  Hemoglobin 6.8 was a lab error, repeat was 8.8. Family Communication:  Disposition: Status is: Inpatient Remains inpatient appropriate because: Unsafe discharge, pending CIR.  Planned Discharge Destination: Rehab    Time spent: 35 minutes  Author: Sharen Hones, MD 03/17/2022 1:33 PM  For on call review [email or web address].

## 2022-03-17 NOTE — Progress Notes (Signed)
Patient did 3.5 hours of HD, tolerated well.  UF: 1.5 L    03/17/22 1307  Vitals  Temp 98.6 F (37 C)  Temp Source Oral  BP 134/75  MAP (mmHg) 91  BP Location Right Arm  BP Method Automatic  Patient Position (if appropriate) Lying  Pulse Rate 80  Pulse Rate Source Monitor  ECG Heart Rate 93  Resp 12  Oxygen Therapy  SpO2 100 %  O2 Device Room Air  Patient Activity (if Appropriate) In bed  Pulse Oximetry Type Continuous  Post Treatment  Dialyzer Clearance Lightly streaked  Duration of HD Treatment -hour(s) 3.5 hour(s)  Hemodialysis Intake (mL) 0 mL  Liters Processed 83.1  Fluid Removed (mL) 1500 mL  Tolerated HD Treatment Yes  Fistula / Graft Left Forearm Arteriovenous fistula  No placement date or time found.   Placed prior to admission: Yes  Orientation: Left  Access Location: Forearm  Access Type: Arteriovenous fistula  Site Condition No complications  Status Deaccessed  Drainage Description None

## 2022-03-17 NOTE — Progress Notes (Signed)
[facility], [place] 03/17/22  Subjective:   LOS: 12  Patient known to our practice from previous admissions.  Patient admitted for worsening right lower extremity ulcer.  Currently getting IV broad-spectrum antibiotics.   Patient underwent irrigation and debridement of right lower extremity and placement of wound VAC.  Patient seen and evaluated during dialysis   HEMODIALYSIS FLOWSHEET:  Blood Flow Rate (mL/min): 400 mL/min Arterial Pressure (mmHg): -140 mmHg Venous Pressure (mmHg): 230 mmHg TMP (mmHg): 2 mmHg Ultrafiltration Rate (mL/min): 686 mL/min Dialysate Flow Rate (mL/min): 300 ml/min Dialysis Fluid Bolus: Normal Saline Bolus Amount (mL): 200 mL  Tolerating treatment well Alert and oriented  Objective:  Vital signs in last 24 hours:  Temp:  [97.7 F (36.5 C)-98.8 F (37.1 C)] 97.7 F (36.5 C) (01/02 0916) Pulse Rate:  [74-88] 84 (01/02 1100) Resp:  [12-21] 14 (01/02 1100) BP: (98-163)/(59-86) 163/86 (01/02 1100) SpO2:  [97 %-99 %] 97 % (01/02 1100) Weight:  [108.8 kg] 108.8 kg (01/02 0558)  Weight change: 0.7 kg Filed Weights   03/15/22 0500 03/16/22 0500 03/17/22 0558  Weight: 108.6 kg 108.1 kg 108.8 kg    Intake/Output:    Intake/Output Summary (Last 24 hours) at 03/17/2022 1120 Last data filed at 03/17/2022 1100 Gross per 24 hour  Intake --  Output 1 ml  Net -1 ml      Physical Exam: General: NAD, laying in the bed  HEENT Moist oral mucous membranes  Pulm/lungs Normal breathing effort  CVS/Heart No rub  Abdomen:  Soft, nontender, nondistended  Extremities: Rt AKA, no edema  Neurologic: Alert and oriented  Skin: No acute rashes  Access: AV fistula       Basic Metabolic Panel:  Recent Labs  Lab 03/10/22 1706 03/12/22 0540 03/14/22 0849 03/15/22 0518 03/17/22 0542  NA 133* 136 134* 134* 133*  K 5.2* 4.6 3.6 3.4* 3.6  CL 93* 96* 96* 93* 93*  CO2 25 27 27 31 29   GLUCOSE 78 88 143* 113* 113*  BUN 53* 37*  25* 21* 48*  CREATININE 12.03* 8.59* 5.68* 4.88* 8.72*  CALCIUM 8.6* 8.4* 8.3* 8.3* 8.1*  MG  --   --   --  1.9 2.3  PHOS  --   --  4.8* 3.9 5.0*      CBC: Recent Labs  Lab 03/10/22 1706 03/12/22 0540 03/14/22 0849 03/15/22 0518 03/17/22 0929  WBC 7.9 7.9 13.3* 9.7 6.3  HGB 10.0* 10.3* 9.6* 10.0* 6.6*  HCT 31.5* 32.2* 28.7* 30.6* 20.8*  MCV 88.5 88.2 83.7 85.5 87.4  PLT 353 341 354 336 226       Lab Results  Component Value Date   HEPBSAG NON REACTIVE 03/06/2022      Microbiology:  No results found for this or any previous visit (from the past 240 hour(s)).   Coagulation Studies: No results for input(s): "LABPROT", "INR" in the last 72 hours.   Urinalysis: No results for input(s): "COLORURINE", "LABSPEC", "PHURINE", "GLUCOSEU", "HGBUR", "BILIRUBINUR", "KETONESUR", "PROTEINUR", "UROBILINOGEN", "NITRITE", "LEUKOCYTESUR" in the last 72 hours.  Invalid input(s): "APPERANCEUR"    Imaging: No results found.   Medications:    anticoagulant sodium citrate      amiodarone  200 mg Oral BID   apixaban  2.5 mg Oral BID   bumetanide  1 mg Oral BID   Chlorhexidine Gluconate Cloth  6 each Topical Q0600   feeding supplement  237 mL Oral BID BM   multivitamin  1 tablet Oral QHS  rosuvastatin  10 mg Oral Daily   acetaminophen **OR** acetaminophen, alteplase, anticoagulant sodium citrate, dextrose, heparin, lidocaine (PF), lidocaine-prilocaine, ondansetron **OR** ondansetron (ZOFRAN) IV, oxyCODONE-acetaminophen, pentafluoroprop-tetrafluoroeth  Assessment/ Plan:  62 y.o. male with diabetes with complications, ESRD, atrial fibrillation requiring Eliquis, amiodarone, congestive heart failure, secondary hyperparathyroidism, chronic nonhealing right lower extremity wound was admitted on 03/05/2022 for  Active Problems:   Anemia in chronic kidney disease   ESRD on dialysis ([place])   Obesity (BMI 30-39.9)   Type 2 diabetes mellitus with ESRD (end-stage renal disease)  (HCC)   Chronic atrial fibrillation with RVR (HCC)   Hyperlipidemia associated with type 2 diabetes mellitus (HCC)   Chronic systolic CHF (congestive heart failure) (HCC)   Hypokalemia   Hyponatremia   Acute metabolic encephalopathy   Hypotension   Uncontrolled type 2 diabetes mellitus with hypoglycemia, with long-term current use of insulin ([place])  Unspecified open wound, right lower leg, initial encounter [S81.801A] Necrotizing fasciitis of lower leg ([place]) [M72.6]  #. ESRD [facility]/MWF/left aVF   Patient receiving dialysis treatment today, last treatment received on Saturday.  Patient will receive additional treatment tomorrow to maintain outpatient schedule.  Monitoring discharge planning to include rehab placement.  #. Anemia of CKD  Lab Results  Component Value Date   HGB 6.6 (L) 03/17/2022   Hemoglobin decreased to 6.6.  Primary team notified.  Will order EPO with dialysis treatments.  #. Secondary hyperparathyroidism of renal origin N 25.81   No results found for: "PTH" Lab Results  Component Value Date   PHOS 5.0 (H) 03/17/2022   Calcium and phosphorus within desired range.  Will continue to monitor.  #.  Nonhealing right lower extremity ulcer with wound infection -Completed antibiotic therapy. -Irrigation and debridement with wound vac placement with undesirable results. Rt AKA completed on 03/13/22. Vascular continues to follow.  Pain well-managed.  Awaiting placement for short-term rehab.  # Acute metabolic encephalopathy.  MRI head suggestive of subacute infarct however neurology has disputed this finding.  EEG performed, suggestive of metabolic encephalopathy.  Resolved   LOS: [place] 1/2/202411:20 [place], [place]

## 2022-03-17 NOTE — TOC Progression Note (Signed)
Transition of Care [facility]) - Progression Note    Patient Details  Name: Adam Howell MRN: [id number] Date of Birth: 12/08/1960  Transition of Care [facility]) CM/SW Contact  Beverly Sessions, RN Phone Number: 03/17/2022, 2:17 PM  Clinical Narrative:     Secure chat messages sent to [facility] Admissions coordinator at [facility], she is to discuss recommendation with patient today   Expected Discharge Plan: [place] Barriers to Discharge: Continued Medical Work up  Expected Discharge Plan and Services                                               Social Determinants of Health (SDOH) Interventions [place]: Food Insecurity Present (03/06/2022)  Housing: Low Risk  (03/06/2022)  Transportation Needs: No Transportation Needs (03/06/2022)  Utilities: Not At Risk (03/06/2022)  Alcohol Screen: Low Risk  (11/25/2021)  Depression (PHQ2-9): Low Risk  (11/25/2021)  Financial Resource Strain: Low Risk  (11/25/2021)  Physical Activity: Insufficiently Active (11/25/2021)  Social Connections: Moderately Isolated (11/25/2021)  Stress: No Stress Concern Present (11/25/2021)  Tobacco Use: Low Risk  (03/14/2022)    Readmission Risk Interventions     No data to display

## 2022-03-17 NOTE — PMR Pre-admission (Signed)
PMR Admission Coordinator Pre-Admission Assessment  Patient: Adam Howell is an 62 y.o., male MRN: [id number] DOB: 1960-12-16 Height: _0  (190.5 cm) Weight: 108.8 kg  Insurance Information HMO: yes    PPO:      PCP:      IPA:      80/20:      OTHER:  PRIMARY: [employer]      Policy#: [id number]      Subscriber: Pt  CM Name: Luster Landsberg       Phone#: [phone]     Fax#: [phone] Pre-Cert#: [id number]      Employer: Pt. Approved for 7 days on 03/18/21 with updates due on 7th day. Pt. With 5 days from 1/3 to admit .  Benefits:  Phone #:      Name:  Irene Shipper Date: 03/16/2022- still active   Deductible: $240 ($0 met) OOP Max: $8,850 ($0 met)   CIR: $2,080 copay/admission SNF: 100% coverage   Outpatient:  80% coverage; 20% co-insurance   Home Health:  100% coverage   DME: 80% coverage; 20% co-insurance   Providers: in network   SECONDARY:  Medicaid of [place]      Policy#: [id number]      Phone#:   Financial Counselor:       Phone#:   The "Data Collection Information Summary" for patients in Inpatient Rehabilitation Facilities with attached "Privacy Act [place] Records" was provided and verbally reviewed with: Pt  Emergency Contact Information Contact Information     Name Relation Home Work Mobile   speel,michelle Daughter   [phone]       Current Medical History  Patient Admitting Diagnosis:encephalopathy,  R AKA  History of Present Illness: Adam Howell is a 62 y.o. male with medical history significant for diabetes mellitus with complications of end-stage renal disease on hemodialysis (M/W/F), history of A-fib on Eliquis and amiodarone, HFpEF (EF 35 to 40% 2021), secondary hyperparathyroidism with chronic nonhealing right lower extremity wound for which he was hospitalized for debridement and IV antibiotics from 10/31 to 11/6.  He was sent to the [facility] ED 212/22/23 by his home health nurse due to concerns  for increased drainage and foul odor from the wound in spite of ongoing dressings.  MRI right tib-fib suggesting myositis without osteomyelitis. Patient was started on Zosyn and vancomycin.  Patient was taken to the OR for debridement on 03/07/22.  Wound culture only grew out gram-negative rods, antibiotics switched to Zosyn only.  Patient had a prolonged episode of altered mental status postop, currently improved.  Had another episode of decreased responsiveness on 12/26.  Seen by neurology, most likely metabolic encephalopathy.  MRI of the brain reported as new stroke, neurology has reviewed it, does not believe this is stroke.  Patient had right-sided above-knee amputation 03/13/22. Pt. Seen by PT/OT who recommend CIR to assist return to PLOF.Marland Kitchen   Patient's medical record from [facility] has been reviewed by the rehabilitation admission coordinator and physician.  Past Medical History  Past Medical History:  Diagnosis Date   Chronic kidney disease    Congestive heart failure ([place])    Diabetes mellitus without complication ([place])    Difficult intubation    Hyperlipidemia    Hypertension     Has the patient had major surgery during 100 days prior to admission? Yes  Family History   family history includes Cirrhosis in his father; Heart failure in his mother; Hypertension in his daughter.  Current Medications  Current Facility-Administered Medications:    acetaminophen (TYLENOL) tablet 650 mg, 650 mg, Oral, Q6H PRN, 650 mg at 03/17/22 1054 **OR** acetaminophen (TYLENOL) suppository 650 mg, 650 mg, Rectal, Q6H PRN, Dew, Erskine Squibb, MD   alteplase (CATHFLO ACTIVASE) injection 2 mg, 2 mg, Intracatheter, Once PRN, Colon Flattery, NP   amiodarone (PACERONE) tablet 200 mg, 200 mg, Oral, BID, Lucky Cowboy, Erskine Squibb, MD, 200 mg at 03/17/22 6503   anticoagulant sodium citrate solution 5 mL, 5 mL, Intracatheter, PRN, Lucky Cowboy, Erskine Squibb, MD   apixaban Arne Cleveland) tablet 2.5 mg, 2.5 mg, Oral, BID,  Dew, Erskine Squibb, MD, 2.5 mg at 03/17/22 0818   bumetanide (BUMEX) tablet 1 mg, 1 mg, Oral, BID, Dew, Erskine Squibb, MD, 1 mg at 03/17/22 1350   Chlorhexidine Gluconate Cloth 2 % PADS 6 each, 6 each, Topical, Q0600, Algernon Huxley, MD, 6 each at 03/17/22 0526   dextrose 50 % solution 12.5 g, 12.5 g, Intravenous, PRN, Lucky Cowboy, Erskine Squibb, MD   epoetin alfa (EPOGEN) injection 10,000 Units, 10,000 Units, Intravenous, Once, Breeze, Shantelle, NP   heparin injection 1,000 Units, 1,000 Units, Intracatheter, PRN, Breeze, Shantelle, NP   lidocaine (PF) (XYLOCAINE) 1 % injection 5 mL, 5 mL, Intradermal, PRN, Breeze, Shantelle, NP   lidocaine-prilocaine (EMLA) cream 1 Application, 1 Application, Topical, PRN, Breeze, Shantelle, NP   loperamide (IMODIUM) capsule 2 mg, 2 mg, Oral, PRN, Sharen Hones, MD, 2 mg at 03/17/22 1514   multivitamin (RENA-VIT) tablet 1 tablet, 1 tablet, Oral, QHS, Dew, Erskine Squibb, MD, 1 tablet at 03/16/22 2045   ondansetron (ZOFRAN) tablet 4 mg, 4 mg, Oral, Q6H PRN **OR** ondansetron (ZOFRAN) injection 4 mg, 4 mg, Intravenous, Q6H PRN, Dew, Erskine Squibb, MD   oxyCODONE-acetaminophen (PERCOCET/ROXICET) 5-325 MG per tablet 1-2 tablet, 1-2 tablet, Oral, Q4H PRN, Sharen Hones, MD, 2 tablet at 03/17/22 1351   pentafluoroprop-tetrafluoroeth (GEBAUERS) aerosol 1 Application, 1 Application, Topical, PRN, Colon Flattery, NP, 1 Application at 54/65/68 0916   rosuvastatin (CRESTOR) tablet 10 mg, 10 mg, Oral, Daily, Sharen Hones, MD, 10 mg at 03/17/22 1275  Patients Current Diet:  Diet Order             Diet renal with fluid restriction Fluid restriction: 1200 mL Fluid; Room service appropriate? Yes; Fluid consistency: Thin  Diet effective now                   Precautions / Restrictions Precautions Precautions: Fall Precaution Comments: s/p R AKA Restrictions Weight Bearing Restrictions: Yes RLE Weight Bearing: Non weight bearing   Has the patient had 2 or more falls or a fall with injury in the past  year? No  Prior Activity Level Community (5-7x/wk): Pt active in the community PTA  Prior Functional Level Self Care: Did the patient need help bathing, dressing, using the toilet or eating? Independent  Indoor Mobility: Did the patient need assistance with walking from room to room (with or without device)? Independent  Stairs: Did the patient need assistance with internal or external stairs (with or without device)? Independent  Functional Cognition: Did the patient need help planning regular tasks such as shopping or remembering to take medications? Independent  Patient Information Are you of Hispanic, Latino/a,or Spanish origin?: A. No, not of Hispanic, Latino/a, or Spanish origin What is your race?: B. Black or African American Do you need or want an interpreter to communicate with a doctor or health care staff?: 0. No  Patient's Response To:  Health Literacy and Transportation Is the patient  able to respond to health literacy and transportation needs?: Yes Health Literacy - How often do you need to have someone help you when you read instructions, pamphlets, or other written material from your doctor or pharmacy?: Sometimes In the past 12 months, has lack of transportation kept you from medical appointments or from getting medications?: No In the past 12 months, has lack of transportation kept you from meetings, work, or from getting things needed for daily living?: No  Home Assistive Devices / [place] Devices/Equipment: Environmental consultant (specify type) Home Equipment: Rolling Walker (2 wheels)  Prior Device Use: Indicate devices/aids used by the patient prior to current illness, exacerbation or injury? Walker  Current Functional Level Cognition  Overall Cognitive Status: No family/caregiver present to determine baseline cognitive functioning Orientation Level: Oriented X4 Following Commands: Follows one step commands consistently, Follows one step commands with  increased time Safety/Judgement: Decreased awareness of deficits General Comments: [name] or speak voluntarorly. Was cooperative but Pryor Curia questions if he fully understand current scope of situation.    Extremity Assessment (includes Sensation/Coordination)  Upper Extremity Assessment: Defer to OT evaluation  Lower Extremity Assessment: Generalized weakness, RLE deficits/detail RLE Deficits / Details: new R AKA    ADLs  Overall ADL's : Needs assistance/impaired Grooming: Wash/dry hands, Wash/dry face, Sitting, Set up Upper Body Dressing : With caregiver independent assisting Upper Body Dressing Details (indicate cue type and reason): gown seated in chair Lower Body Dressing: Moderate assistance Lower Body Dressing Details (indicate cue type and reason): pt doff sock with MIN A, donn with MOD A Toileting- Clothing Manipulation and Hygiene: Maximal assistance General ADL Comments: STS 5x with MAX A to approx 1/2 full upright standing with RW; changed socked, additional 3 attempts with pt able to stand up to 3/4 full upright standing with RW    Mobility  Overal bed mobility: Needs Assistance Bed Mobility: Supine to Sit Supine to sit: Mod assist Sit to supine: Mod assist, +2 for physical assistance General bed mobility comments: NT in recliner pre/post session    Transfers  Overall transfer level: Needs assistance Equipment used: Rolling walker (2 wheels) Transfers: Sit to/from Stand Sit to Stand: Max assist (from chair) Bed to/from chair/wheelchair/BSC transfer type:: Lateral/scoot transfer  Lateral/Scoot Transfers: Min assist General transfer comment: Mod assist to stand from elevated bed height. Vcs for technique and sequencing. author protected L knee to prevent buckling however none observed. [name]    Ambulation / Gait / Stairs / Wheelchair Mobility  Ambulation/Gait Ambulation/Gait  assistance: Mod assist Gait Distance (Feet): 3 Feet Assistive device: Rolling walker (2 wheels) Gait Pattern/deviations:  (" hop to") General Gait Details: [name] but fatigued quickly resulting in need to pivot the rest of the way to recliner. Gait velocity: decreased    Posture / Balance Dynamic Sitting Balance Sitting balance - Comments: for seated ADLs Balance Overall balance assessment: Needs assistance Sitting-balance support: Bilateral upper extremity supported Sitting balance-Leahy Scale: Good Sitting balance - Comments: for seated ADLs Standing balance support: Bilateral upper extremity supported, During functional activity, Reliant on assistive device for balance Standing balance-Leahy Scale: Fair Standing balance comment: limited standing endurance however did well with BUE support    Special needs/care consideration Skin surgical incision  and Special service needs ***   Previous Home Environment (from acute therapy documentation) Living Arrangements: Other relatives (daughter) Available Help at Discharge:  Family, Available 24 hours/day Type of Home: Apartment Home Layout: One level Home Access: Level entry Bathroom Shower/Tub: Chiropodist: Handicapped height Home Care Services: No Additional Comments: Pt reports he lives with daughter who does not work  Nurse, learning disability for Discharge Living Setting: Patient's home Type of Home at Discharge: Apartment Discharge Home Layout: One level Discharge Home Access: Level entry Entrance Stairs-Rails: None Discharge Bathroom Shower/Tub: Tub/shower unit Discharge Bathroom Toilet: Standard Discharge Bathroom Accessibility: Yes How Accessible: Accessible via walker, Accessible via wheelchair Does the patient have any problems obtaining your medications?: No  Social/Family/Support Systems Patient Roles: Other (Comment) Contact Information:  [phone] Anticipated Caregiver: [name] (daughter) Ability/Limitations of Caregiver: Min A Caregiver Availability: 24/7  Goals Patient/Family Goal for Rehab: PT/OT Min A Expected length of stay: 18-21 days Pt/Family Agrees to Admission and willing to participate: Yes Program Orientation Provided & Reviewed with Pt/Caregiver Including Roles  & Responsibilities: Yes  Decrease burden of Care through IP rehab admission: none   Possible need for SNF placement upon discharge: Not anticipated  Patient Condition: I have reviewed medical records from [facility] , spoken with CM, and patient. I discussed via phone for inpatient rehabilitation assessment.  Patient will benefit from ongoing PT and OT, can actively participate in 3 hours of therapy a day 5 days of the week, and can make measurable gains during the admission.  Patient will also benefit from the coordinated team approach during an Inpatient Acute Rehabilitation admission.  The patient will receive intensive therapy as well as Rehabilitation physician, nursing, social worker, and care management interventions.  Due to safety, skin/wound care, disease management, medication administration, pain management, and patient education the patient requires 24 hour a day rehabilitation nursing.  The patient is currently *** with mobility and basic ADLs.  Discharge setting and therapy post discharge at home with home health is anticipated.  Patient has agreed to participate in the Acute Inpatient Rehabilitation Program and will admit {Time; today/tomorrow:10263}.  Preadmission Screen Completed By:  Genella Mech, 03/17/2022 3:17 PM ______________________________________________________________________   Discussed status with Dr. Marland Kitchen on *** at *** and received approval for admission today.  Admission Coordinator:  Genella Mech, CCC-SLP, time Marland KitchenSudie Grumbling ***   Assessment/Plan: Diagnosis: Does the need for close, 24 hr/day  Medical supervision in concert with the patient's rehab needs make it unreasonable for this patient to be served in a less intensive setting? {yes_no_potentially:3041433} Co-Morbidities requiring supervision/potential complications: *** Due to {due UJ:8119147}, does the patient require 24 hr/day rehab nursing? {yes_no_potentially:3041433} Does the patient require coordinated care of a physician, rehab nurse, PT, OT, and SLP to address physical and functional deficits in the context of the above medical diagnosis(es)? {yes_no_potentially:3041433} Addressing deficits in the following areas: {deficits:3041436} Can the patient actively participate in an intensive therapy program of at least 3 hrs of therapy 5 days a week? {yes_no_potentially:3041433} The potential for patient to make measurable gains while on inpatient rehab is {potential:3041437} Anticipated functional outcomes upon discharge from inpatient rehab: {functional outcomes:304600100} PT, {functional outcomes:304600100} OT, {functional outcomes:304600100} SLP Estimated rehab length of stay to reach the above functional goals is: *** Anticipated discharge destination: {anticipated dc setting:21604} 10. Overall Rehab/Functional Prognosis: {potential:3041437}   MD Signature: ***

## 2022-03-17 NOTE — Progress Notes (Signed)
  Progress Note    03/17/2022 3:14 PM 4 Days Post-Op  Subjective: [name]. Carrier is a 63 year old African-American male who is now status post 4 days from right above-the-knee amputation.  He is currently sitting on the bedside commode this afternoon.  He states the milk products he has been taking have caused diarrhea.  He asked that I discontinue them for him today.  He denies any pain or phantom pains to his right leg.  He states he feels much much better overall.  He is recovering as expected and wishes to go to a rehab facility as soon as possible.   Vitals:   03/17/22 1300 03/17/22 1307  BP: (!) 145/76 134/75  Pulse: 80 80  Resp: 20 12  Temp:  98.6 F (37 C)  SpO2: 100% 100%   Physical Exam: Cardiac:  RRR Lungs:  Clear Throughout all Fields Incisions:  C/D/I Extremities:  Right AKA and palpable pulses in left lower extremity Abdomen:  Soft, non tender and non distended with hyper bowel sounds.  Neurologic: AAOX3 Person, place and time.   CBC    Component Value Date/Time   WBC 6.3 03/17/2022 0929   RBC 2.38 (L) 03/17/2022 0929   HGB 8.8 (L) 03/17/2022 1206   HGB 14.4 02/03/2022 1441   HCT 27.7 (L) 03/17/2022 1206   HCT 44.0 02/03/2022 1441   PLT 226 03/17/2022 0929   PLT 200 02/03/2022 1441   MCV 87.4 03/17/2022 0929   MCV 87 02/03/2022 1441   MCH 27.7 03/17/2022 0929   MCHC 31.7 03/17/2022 0929   RDW 16.3 (H) 03/17/2022 0929   RDW 14.8 02/03/2022 1441   LYMPHSABS 0.8 03/05/2022 1323   LYMPHSABS 0.8 02/03/2022 1441   MONOABS 0.7 03/05/2022 1323   EOSABS 0.2 03/05/2022 1323   EOSABS 0.4 02/03/2022 1441   BASOSABS 0.1 03/05/2022 1323   BASOSABS 0.1 02/03/2022 1441    BMET    Component Value Date/Time   NA 133 (L) 03/17/2022 0542   NA 136 02/03/2022 1441   K 3.6 03/17/2022 0542   CL 93 (L) 03/17/2022 0542   CO2 29 03/17/2022 0542   GLUCOSE 113 (H) 03/17/2022 0542   BUN 48 (H) 03/17/2022 0542   BUN 35 (H) 02/03/2022 1441   CREATININE 8.72 (H)  03/17/2022 0542   CALCIUM 8.1 (L) 03/17/2022 0542   GFRNONAA 6 (L) 03/17/2022 0542    INR    Component Value Date/Time   INR 1.2 03/05/2022 1943     Intake/Output Summary (Last 24 hours) at 03/17/2022 1514 Last data filed at 03/17/2022 1307 Gross per 24 hour  Intake --  Output 1501 ml  Net -1501 ml     Assessment/Plan:  63 y.o. male is s/p Right Above the Knee Amputation  4 Days Post-Op   AKA: Routine postop amputation care with physical therapy and Occupational Therapy. Rehab facilities are being contacted today for placement and acceptance.   Diarrhea: Patient states he is unable to tolerate milk products they give him large amounts of gas and diarrhea. He states he has had multiple bouts of diarrhea today. I discontinued the boost drinks he had ordered he feels is the cause of his diarrhea.    Drema Pry [facility] 03/17/2022 3:14 PM

## 2022-03-17 NOTE — Progress Notes (Signed)
PT Cancellation Note  Patient Details Name: Lissandro Dilorenzo MRN: [id number] DOB: 1960/11/05   Cancelled Treatment:    Reason Eval/Treat Not Completed: Other (comment). Session attempted, pt currently out of room for HD. Will re-attempt, time permitting.   Loralei Radcliffe 03/17/2022, 9:24 AM Greggory Stallion, PT, DPT, GCS [phone]

## 2022-03-17 NOTE — Progress Notes (Signed)
Inpatient Rehab Admissions Coordinator:    I spoke with pt. Over the phone to discuss potential CIR admit. States he is interested, daughter can provide 24/7 support. Daughter's phone is disconnected so I'm not able to confirm this with her. I will open a case with insurance and pursue for admit.   Clemens Catholic, [place], [place] Admissions Coordinator  [phone] ([place]) [phone] (office)

## 2022-03-18 DIAGNOSIS — T148XXA Other injury of unspecified body region, initial encounter: Secondary | ICD-10-CM | POA: Diagnosis not present

## 2022-03-18 DIAGNOSIS — L089 Local infection of the skin and subcutaneous tissue, unspecified: Secondary | ICD-10-CM | POA: Diagnosis not present

## 2022-03-18 LAB — RENAL FUNCTION PANEL
Albumin: 2.3 g/dL — ABNORMAL LOW (ref 3.5–5.0)
Anion gap: 11 (ref 5–15)
BUN: 37 mg/dL — ABNORMAL HIGH (ref 6–20)
CO2: 28 mmol/L (ref 22–32)
Calcium: 8 mg/dL — ABNORMAL LOW (ref 8.9–10.3)
Chloride: 91 mmol/L — ABNORMAL LOW (ref 98–111)
Creatinine, Ser: 7.12 mg/dL — ABNORMAL HIGH (ref 0.61–1.24)
GFR, Estimated: 8 mL/min — ABNORMAL LOW
Glucose, Bld: 133 mg/dL — ABNORMAL HIGH (ref 70–99)
Phosphorus: 4 mg/dL (ref 2.5–4.6)
Potassium: 3.7 mmol/L (ref 3.5–5.1)
Sodium: 130 mmol/L — ABNORMAL LOW (ref 135–145)

## 2022-03-18 LAB — GLUCOSE, CAPILLARY
Glucose-Capillary: 98 mg/dL (ref 70–99)
Glucose-Capillary: 101 mg/dL — ABNORMAL HIGH (ref 70–99)
Glucose-Capillary: 98 mg/dL (ref 70–99)

## 2022-03-18 LAB — CBC
HCT: 25.1 % — ABNORMAL LOW (ref 39.0–52.0)
Hemoglobin: 8.1 g/dL — ABNORMAL LOW (ref 13.0–17.0)
MCH: 28.1 pg (ref 26.0–34.0)
MCHC: 32.3 g/dL (ref 30.0–36.0)
MCV: 87.2 fL (ref 80.0–100.0)
Platelets: 293 10*3/uL (ref 150–400)
RBC: 2.88 MIL/uL — ABNORMAL LOW (ref 4.22–5.81)
RDW: 16.8 % — ABNORMAL HIGH (ref 11.5–15.5)
WBC: 7.9 10*3/uL (ref 4.0–10.5)
nRBC: 0 % (ref 0.0–0.2)

## 2022-03-18 MED ORDER — ACETAMINOPHEN 325 MG PO TABS
ORAL_TABLET | ORAL | Status: AC
  Filled 2022-03-18: qty 1

## 2022-03-18 MED ORDER — MELATONIN 5 MG PO TABS
5.0000 mg | ORAL_TABLET | Freq: Every evening | ORAL | Status: DC | PRN
  Administered 2022-03-18: 5 mg via ORAL
  Filled 2022-03-18: qty 1

## 2022-03-18 MED ORDER — EPOETIN ALFA 10000 UNIT/ML IJ SOLN: 4000.0000 [IU] | INTRAMUSCULAR | Status: DC

## 2022-03-18 MED ORDER — MELATONIN 5 MG PO TABS
5.0000 mg | ORAL_TABLET | Freq: Once | ORAL | Status: AC
  Administered 2022-03-18: 5 mg via ORAL
  Filled 2022-03-18: qty 1

## 2022-03-18 MED ORDER — ACETAMINOPHEN 325 MG PO TABS
ORAL_TABLET | ORAL | Status: AC
  Filled 2022-03-18: qty 2

## 2022-03-18 NOTE — Progress Notes (Signed)
[facility], [place] 03/18/22  Subjective:   LOS: 13  Patient known to our practice from previous admissions.  Patient admitted for worsening right lower extremity ulcer.  Currently getting IV broad-spectrum antibiotics.   Patient underwent irrigation and debridement of right lower extremity and placement of wound VAC.  Patient seen resting in bed, currently awaiting breakfast Experiencing diarrhea yesterday, denies diarrhea today Remains on room air Pain well-controlled with current pain medications.   Objective:  Vital signs in last 24 hours:  Temp:  [97.8 F (36.6 C)-98.6 F (37 C)] 98.2 F (36.8 C) (01/03 0730) Pulse Rate:  [76-87] 79 (01/03 0730) Resp:  [12-20] 18 (01/03 0730) BP: (112-160)/(58-82) 140/73 (01/03 0730) SpO2:  [96 %-100 %] 99 % (01/03 0730) Weight:  [106 kg] 106 kg (01/03 0500)  Weight change: -2.8 kg Filed Weights   03/16/22 0500 03/17/22 0558 03/18/22 0500  Weight: 108.1 kg 108.8 kg 106 kg    Intake/Output:    Intake/Output Summary (Last 24 hours) at 03/18/2022 1131 Last data filed at 03/17/2022 1307 Gross per 24 hour  Intake --  Output 1500 ml  Net -1500 ml      Physical Exam: General: NAD, laying in the bed  HEENT Moist oral mucous membranes  Pulm/lungs Normal breathing effort  CVS/Heart No rub  Abdomen:  Soft, nontender, nondistended  Extremities: Rt AKA, no edema  Neurologic: Alert and oriented  Skin: No acute rashes  Access: AV fistula       Basic Metabolic Panel:  Recent Labs  Lab 03/12/22 0540 03/14/22 0849 03/15/22 0518 03/17/22 0542  NA 136 134* 134* 133*  K 4.6 3.6 3.4* 3.6  CL 96* 96* 93* 93*  CO2 27 27 31 29   GLUCOSE 88 143* 113* 113*  BUN 37* 25* 21* 48*  CREATININE 8.59* 5.68* 4.88* 8.72*  CALCIUM 8.4* 8.3* 8.3* 8.1*  MG  --   --  1.9 2.3  PHOS  --  4.8* 3.9 5.0*      CBC: Recent Labs  Lab 03/12/22 0540 03/14/22 0849 03/15/22 0518 03/17/22 0929 03/17/22 1206  WBC 7.9  13.3* 9.7 6.3  --   HGB 10.3* 9.6* 10.0* 6.6* 8.8*  HCT 32.2* 28.7* 30.6* 20.8* 27.7*  MCV 88.2 83.7 85.5 87.4  --   PLT 341 354 336 226  --        Lab Results  Component Value Date   HEPBSAG NON REACTIVE 03/06/2022      Microbiology:  No results found for this or any previous visit (from the past 240 hour(s)).   Coagulation Studies: No results for input(s): "LABPROT", "INR" in the last 72 hours.   Urinalysis: No results for input(s): "COLORURINE", "LABSPEC", "PHURINE", "GLUCOSEU", "HGBUR", "BILIRUBINUR", "KETONESUR", "PROTEINUR", "UROBILINOGEN", "NITRITE", "LEUKOCYTESUR" in the last 72 hours.  Invalid input(s): "APPERANCEUR"    Imaging: No results found.   Medications:    anticoagulant sodium citrate      amiodarone  200 mg Oral BID   apixaban  2.5 mg Oral BID   bumetanide  1 mg Oral BID   Chlorhexidine Gluconate Cloth  6 each Topical Q0600   epoetin (EPOGEN/PROCRIT) injection  10,000 Units Intravenous Once   multivitamin  1 tablet Oral QHS   rosuvastatin  10 mg Oral Daily   acetaminophen **OR** acetaminophen, alteplase, anticoagulant sodium citrate, dextrose, heparin, lidocaine (PF), lidocaine-prilocaine, loperamide, ondansetron **OR** ondansetron (ZOFRAN) IV, oxyCODONE-acetaminophen, pentafluoroprop-tetrafluoroeth  Assessment/ Plan:  62 y.o. male with diabetes with complications, ESRD, atrial fibrillation requiring Eliquis,  amiodarone, congestive heart failure, secondary hyperparathyroidism, chronic nonhealing right lower extremity wound was admitted on 03/05/2022 for  Active Problems:   Anemia in chronic kidney disease   ESRD on dialysis ([place])   Obesity (BMI 30-39.9)   Type 2 diabetes mellitus with ESRD (end-stage renal disease) (H[facility])   Chronic atrial fibrillation with RVR (H[facility])   Hyperlipidemia associated with type 2 diabetes mellitus ([facility])   Chronic systolic CHF (congestive heart failure) ([facility])   Hypokalemia   Hyponatremia   Acute metabolic  encephalopathy   Hypotension   Uncontrolled type 2 diabetes mellitus with hypoglycemia, with long-term current use of insulin ([place])  Unspecified open wound, right lower leg, initial encounter [S81.801A] Necrotizing fasciitis of lower leg ([place]) [M72.6]  #. ESRD [facility]/MWF/left aVF   Patient scheduled to receive dialysis later today to maintain outpatient schedule.  Next treatment scheduled for Friday.  Monitoring discharge planning to include rehab placement.  #. Anemia of CKD  Lab Results  Component Value Date   HGB 8.8 (L) 03/17/2022   Due to significant decrease, hemoglobin rechecked yesterday found to be 8.8.  Will order patient low-dose levo with dialysis treatments.  #. Secondary hyperparathyroidism of renal origin N 25.81   No results found for: "PTH" Lab Results  Component Value Date   PHOS 5.0 (H) 03/17/2022   Calcium and phosphorus within acceptable range.  #.  Nonhealing right lower extremity ulcer with wound infection -Completed antibiotic therapy. -Irrigation and debridement with wound vac placement with undesirable results. Rt AKA completed on 03/13/22. Vascular continues to follow.  Pain well-managed.  Seeking rehab placement  # Acute metabolic encephalopathy.  MRI head suggestive of subacute infarct however neurology has disputed this finding.  EEG performed, suggestive of metabolic encephalopathy.  Resolved   LOS: [place] 1/3/202411:31 [place], [place]

## 2022-03-18 NOTE — Progress Notes (Signed)
  Progress Note    03/18/2022 11:02 AM 5 Days Post-Op  Subjective:  [name] is a 62 year old African-American male who is now status post 5 days from right above-the-knee amputation. He is currently sitting resting in bed this morning. He states after stopping the milk products yesterday his diarrhea has resolved. He denies any pain or phantom pains to his right leg. He states he feels much much better overall. He is recovering as expected and wishes to go to a rehab facility as soon as possible.    Vitals:   03/18/22 0431 03/18/22 0730  BP: (!) 151/82 (!) 140/73  Pulse: 76 79  Resp: 19 18  Temp: 97.9 F (36.6 C) 98.2 F (36.8 C)  SpO2: 100% 99%   Physical Exam: Cardiac:  RRR Lungs: Clear throughout all fields Incisions: Incisions are clean dry and intact Extremities: Right AKA and palpable pulses in the left lower extremity Abdomen: Soft, nontender and nondistended with normal bowel sounds Neurologic: AAO X3 to person place and time  CBC    Component Value Date/Time   WBC 6.3 03/17/2022 0929   RBC 2.38 (L) 03/17/2022 0929   HGB 8.8 (L) 03/17/2022 1206   HGB 14.4 02/03/2022 1441   HCT 27.7 (L) 03/17/2022 1206   HCT 44.0 02/03/2022 1441   PLT 226 03/17/2022 0929   PLT 200 02/03/2022 1441   MCV 87.4 03/17/2022 0929   MCV 87 02/03/2022 1441   MCH 27.7 03/17/2022 0929   MCHC 31.7 03/17/2022 0929   RDW 16.3 (H) 03/17/2022 0929   RDW 14.8 02/03/2022 1441   LYMPHSABS 0.8 03/05/2022 1323   LYMPHSABS 0.8 02/03/2022 1441   MONOABS 0.7 03/05/2022 1323   EOSABS 0.2 03/05/2022 1323   EOSABS 0.4 02/03/2022 1441   BASOSABS 0.1 03/05/2022 1323   BASOSABS 0.1 02/03/2022 1441    BMET    Component Value Date/Time   NA 133 (L) 03/17/2022 0542   NA 136 02/03/2022 1441   K 3.6 03/17/2022 0542   CL 93 (L) 03/17/2022 0542   CO2 29 03/17/2022 0542   GLUCOSE 113 (H) 03/17/2022 0542   BUN 48 (H) 03/17/2022 0542   BUN 35 (H) 02/03/2022 1441   CREATININE 8.72 (H)  03/17/2022 0542   CALCIUM 8.1 (L) 03/17/2022 0542   GFRNONAA 6 (L) 03/17/2022 0542    INR    Component Value Date/Time   INR 1.2 03/05/2022 1943     Intake/Output Summary (Last 24 hours) at 03/18/2022 1102 Last data filed at 03/17/2022 1307 Gross per 24 hour  Intake --  Output 1500 ml  Net -1500 ml     Assessment/Plan:  62 y.o. male is s/p right above-the-knee amputation 5 Days Post-Op   AKA: Routine postop amputation care with physical therapy and Occupational Therapy.  Rehab facilities are being contacted for placement and acceptance.  Diarrhea: Diarrhea has resolved.   Drema P[facility] 03/18/2022 11:02 AM

## 2022-03-18 NOTE — Progress Notes (Signed)
Inpatient Rehab Admissions Coordinator:   I do not yet have insurance auth for CIR admit. I will follow for potential admit pending insurance auth.   Clemens Catholic, [place], [place] Admissions Coordinator  [phone] ([place]) [phone] (office)

## 2022-03-18 NOTE — Progress Notes (Signed)
       CROSS COVER NOTE  NAME: Adam Howell MRN: [id number] DOB : Nov 19, 1960 ATTENDING PHYSICIAN: Sharen Hones, MD    Date of Service   03/18/2022   HPI/Events of Note   Medication request received for sleep aid.  Interventions   Assessment/Plan: Melatonin      To reach the provider On-Call:   7AM- 7PM see care teams to locate the attending and reach out to them via [email or web address]. 7PM-7AM contact night-coverage If you still have difficulty reaching the appropriate provider, please page the [facility] (Director on Call) for [facility] on amion for assistance  This document was prepared using Set designer software and may include unintentional dictation errors.  Neomia Glass DNP, MBA, FNP-BC Nurse Practitioner [facility] Pager [phone]

## 2022-03-18 NOTE — Plan of Care (Signed)

## 2022-03-18 NOTE — Discharge Instructions (Signed)
Food Resources  Agency Name: [facility] Address: [address], [place], [place] [address] Phone: [phone] Website: www.[email or web address]  Service(s) Offered: Housing services, self-sufficiency, congregate meal  program, weatherization program, heating appliance  repair/replacement program, emergency food assistance,  housing counseling, home ownership program, wheels -towork program. Meals free for 60 and older at various  locations from 9am-1pm, Monday-Friday:  [employer], [address] [place], [phone] [place]   [facility], [address] [place] [phone]   [facility], [address]   [place] [phone] [facility], [address]   [place], [phone]  Agency Name: [facility] Address: [address], [place], [place] [address] Phone: [phone] Website: www.alamancemow.org Service(s) Offered: Home delivered hot, frozen, and emergency  meals. Grocery assistance program which matches  volunteers one-on-one with seniors unable to grocery shop  for themselves. Must be 60 years and older; less than 20  hours of in-home aide service, limited or no driving ability;  live alone or with someone with a disability; live in  [place].  Agency Name: [facility] [employer] Address: [address] [place], [place] [address] Phone: [phone] Service(s) Offered: Food is served to shut-ins, homeless, elderly, and low  income people in the community every Saturday (11:30  am-12:30 pm) and Sunday (12:30 pm-1:30pm). Volunteers  also offer help and encouragement in seeking employment,  and spiritual guidance. July 09, 2016 8  Agency Name: [facility] [employer] [facility] Address: [address] [place], [place], [place] [address] Phone: [phone] Service(s) Offered: Child support services; child welfare services; food stamps;  Medicaid;  work first family assistance; and aid with fuel,  rent, food and medicine.  Agency Name: [facility] Address: [address] [place], [place] Phone: [phone] Website: www.dreamalign.com Services Offered: Monday 10:00am-12:00, 8:00pm-9:00pm, and Friday  10:00am-12:00. Agency Name: [facility] Address: [address], [place], [place] [address] Phone: [phone] Website: www.alliedchurches.org Service(s) Offered: Serves weekday meals, open from 11:30 am- 1:00 pm., and  6:30-7:30pm, Monday-Wednesday-Friday distributes food  3:30-6pm, Monday-Wednesday-Friday.  Agency Name: [facility] Address: [address], [place], [place] Phone: [phone] Website: [email or web address] Services Offered: Distributes food the 4th Saturday of the month, starting at  8:00 am Agency Name: [facility] Address: [address], [place], [place] [address] Phone: [phone] Website: [email or web address]://hbc.[email or web address].[email or web address] Service(s) Offered: [employer], weekly food pantry. Open Wednesdays from  10:00am-noon.  Agency Name: [facility] Address: [address], [place], [place] Phone: [phone] Services Offered: Distributes food 9am-1pm, Monday-Thursday. Call for details.   Agency Name: [facility] Address: [address] [place], [place] [address] Phone: [phone] Website: [email or web address] Service(s) Offered: Emergency Food Pantry. Call for assistance. Agency Name: [facility] Address: [address], [place], [place] [address] Phone: [phone] Service Offered: Emergency Food Pantry. Call for appointment.  Agency Name: [facility] Address: [address] [place], [place] [address] Phone: [phone] Website: [email or web address] Services Offered: Emergency Food Pantry. Call for details Agency Name: [facility] Address: [address] [place], [place] Phone: [phone] Website: [email or web address] Service(s) Offered: Emergency Food Pantry. Call for details.  Agency Name: [facility] Address: [address], [place],  [address] Phone: [phone] or [phone] Website: [email or web address] Service(s) Offered: Distribute food 9am-11:30 am, Tuesday-Friday, and 1- 3:30pm, Monday-Friday. Food pantry Monday-Friday  1pm-3pm, fresh items, Mon.-Wed.-Fri.  Agency Name: [facility] Address: [address] [place], [place] [address] Phone: [phone] Website: www.safealamance.org Services Offered: Distribute food Tues and Sats from 9:00am-noon. Closed  1st Saturday of each month. Call for details    

## 2022-03-18 NOTE — Progress Notes (Signed)
PT Cancellation Note  Patient Details Name: Adam Howell MRN: [id number] DOB: [birth year]   Cancelled Treatment:     PT attempted to see pt 2 x in AM (RN care and pt eating breakfast) and now pt is off floor in HD. Will return in the morning and continue to progress pt to PLOF. CIR still most appropriate DC destination.    Willette Pa 03/18/2022, 3:55 PM

## 2022-03-18 NOTE — Progress Notes (Signed)
Progress Note   Patient: Adam Howell WUX:[id number] DOB: 01-Dec-1960 DOA: 03/05/2022     13 DOS: the patient was seen and examined on 03/18/2022   Brief hospital course:  Adam Howell is a 62 y.o. male with medical history significant for diabetes mellitus with complications of end-stage renal disease on hemodialysis (M/W/F), history of A-fib on Eliquis and amiodarone, HFpEF (EF 35 to 40% 2021), secondary hyperparathyroidism with chronic nonhealing right lower extremity wound for which he was hospitalized for debridement and IV antibiotics from 10/31 to 11/6.  He was sent to the ED by his home health nurse due to concerns for increased drainage and foul odor from the wound in spite of ongoing dressings.  Patient also reports increased pain of the lower leg.   MRI right tib-fib suggesting myositis without osteomyelitis. Patient was started on Zosyn and vancomycin.  Patient was taken to the OR for debridement on 12/23.  Wound culture only grew out gram-negative rods, antibiotics switched to Zosyn only.   Patient had a prolonged episode of altered mental status postop, currently improved.  Had another episode of decreased responsiveness on 12/26.  Seen by neurology, most likely metabolic encephalopathy.  MRI of the brain reported as new stroke, neurology has reviewed it, does not believe this is stroke. 12/27.  Patient has dense encephalopathy, after discussion with neurology, ID, determined this is mainly metabolic in origin. 12/29.  Patient had right-sided above-knee amputation today.  Already waking up postop.  12/31.  Condition much improved, antibiotic completed.  Pending CIR transfer.        Assessment and Plan: Acute metabolic encephalopathy. Hypotension. Patient had a prolonged altered mental status post op due to end-stage renal disease.  Blood pressure was low overnight, received fluid bolus.  Mental status finally improved. Patient had another episode of mental status change  am of 12/26, he was confused, minimally responsive.  Code stroke was called, seen by Dr. Cheral Marker, does not appear to have stroke, I reviewed the CT images together with exam, he appears to have some old stroke, no new stroke at this time.  MRI was also performed, reportedly a new stroke, but reviewed with Dr. Cheral Marker, does not appear to be stroke.  EEG is consistent with metabolic encephalopathy. Mental status has normalized. Condition resolved after AKA.   Wound infection of chronic right lower extremity ulcer due to polymicrobial infection Blood culture negative. Patient had a debridement performed 12/23.  Gram stain shows gram negative rods, culture only has gram-negative rods.  Discontinue vancomycin, continue Zosyn for now. Wound culture still not finalized, appears to be largely microbial, but all gram-negative. Patient appears to have nonsalvageable right lower extremity with a severe wound.   Patient finally agreeable for amputation, he had above-knee amputation. The wound culture finalized with Proteus mirabilis, Citrobacter, and Enterococcus faecium. Antibiotics completed.   Chronic systolic CHF (congestive heart failure) (HCC) Condition stable without volume overload.   Type 2 diabetes mellitus with ESRD (end-stage renal disease) (HCC) Hypoglycemia. Hypoglycemia resolved after better appetite.   ESRD on dialysis [facility]) Patient does not have volume overload, followed by nephrology for scheduled dialysis, schedule is m/w/f   Chronic Atrial fibrillation ([place]) Continue Eliquis and amiodarone   Anemia in chronic kidney disease Stable.   Hyperlipidemia associated with type 2 diabetes mellitus ([place]) Continue rosuvastatin      Subjective:  Patient doing well today, good appetite without shortness of breath.  No confusion. No diarrhea, tolerating diet  Physical Exam: Vitals:   03/17/22  2033 03/18/22 0431 03/18/22 0500 03/18/22 0730  BP: 119/62 (!) 151/82  (!) 140/73  Pulse:  86 76  79  Resp: 19 19  18   Temp: 97.8 F (36.6 C) 97.9 F (36.6 C)  98.2 F (36.8 C)  TempSrc: Oral Oral    SpO2: 100% 100%  99%  Weight:   106 kg   Height:       General exam: Appears calm and comfortable  Respiratory system: Clear to auscultation. Respiratory effort normal. Cardiovascular system: S1 & S2 heard, RRR. No JVD, murmurs, rubs, gallops or clicks. No pedal edema. Gastrointestinal system: Abdomen is nondistended, soft and nontender. No organomegaly or masses felt. Normal bowel sounds heard. Central nervous system: Alert and oriented. No focal neurological deficits. Extremities: Right AKA, dressed Skin: No rashes, lesions or ulcers Psychiatry: Judgement and insight appear normal. Mood & affect appropriate. '    Family Communication:  Disposition: Status is: Inpatient Remains inpatient appropriate because: Unsafe discharge, pending CIR.  Planned Discharge Destination: Rehab    Time spent: 25 minutes  Author: Desma Maxim, MD 03/18/2022 2:46 PM  For on call review [email or web address].

## 2022-03-18 NOTE — Progress Notes (Signed)
Pt did 3 hrs of HD, tolerated well with no issues.  UF = 1000 ml    03/18/22 1900  Vitals  Temp 97.8 F (36.6 C)  Temp Source Oral  BP (!) 137/57  MAP (mmHg) 81  BP Location Right Arm  BP Method Automatic  Patient Position (if appropriate) Lying  Pulse Rate 70  Pulse Rate Source Monitor  ECG Heart Rate 74  Resp 20  Oxygen Therapy  SpO2 (!) 75 %  O2 Device Room Air  Patient Activity (if Appropriate) In bed  Pulse Oximetry Type Continuous  Post Treatment  Dialyzer Clearance Lightly streaked  Duration of HD Treatment -hour(s) 3 hour(s)  Hemodialysis Intake (mL) 0 mL  Liters Processed 72  Fluid Removed (mL) 1000 mL  Tolerated HD Treatment Yes  Post-Hemodialysis Comments no issues  AVG/AVF Arterial Site Held (minutes) 8 minutes  AVG/AVF Venous Site Held (minutes) 8 minutes  Fistula / Graft Left Forearm Arteriovenous fistula  No placement date or time found.   Placed prior to admission: Yes  Orientation: Left  Access Location: Forearm  Access Type: Arteriovenous fistula  Site Condition No complications  Fistula / Graft Assessment Present;Bruit;Thrill  Status Patent  Needle Size 15  Drainage Description None

## 2022-03-18 NOTE — TOC Progression Note (Signed)
Transition of Care [facility]) - Progression Note    Patient Details  Name: Adam Howell MRN: [id number] Date of Birth: Aug 12, 1960  Transition of [facility]) CM/SW Contact  Beverly Sessions, RN Phone Number: 03/18/2022, 10:35 AM  Clinical Narrative:    Clemens Catholic, MS, CCC-SLP Rehab Admissions Coordinator at [facility] working on insurance [place] for CIR   Expected Discharge Plan: [place] Barriers to Discharge: Continued Medical Work up  Expected Discharge Plan and Services                                               Social Determinants of Health (SDOH) Interventions [place]: Food Insecurity Present (03/06/2022)  Housing: Low Risk  (03/06/2022)  Transportation Needs: No Transportation Needs (03/06/2022)  Utilities: Not At Risk (03/06/2022)  Alcohol Screen: Low Risk  (11/25/2021)  Depression (PHQ2-9): Low Risk  (11/25/2021)  Financial Resource Strain: Low Risk  (11/25/2021)  Physical Activity: Insufficiently Active (11/25/2021)  Social Connections: Moderately Isolated (11/25/2021)  Stress: No Stress Concern Present (11/25/2021)  Tobacco Use: Low Risk  (03/14/2022)    Readmission Risk Interventions     No data to display

## 2022-03-18 NOTE — Progress Notes (Signed)
OT Cancellation Note  Patient Details Name: Adam Howell MRN: [id number] DOB: 1960/12/17   Cancelled Treatment:    Reason Eval/Treat Not Completed: Other (comment) (attempted to see pt 2x on this date, first eating lunch, then pt leaving for dialysis when therapist attempted at 15:25. OT will reattempt as able.Shanon Payor, OTD OTR/L  03/18/22, 3:28 PM

## 2022-03-19 ENCOUNTER — Other Ambulatory Visit: Payer: Self-pay

## 2022-03-19 ENCOUNTER — Inpatient Hospital Stay (HOSPITAL_COMMUNITY)
Admission: RE | Admit: 2022-03-19 | Discharge: 2022-04-04 | DRG: 559 | Disposition: A | Discharge status: Home / Self Care | Payer: Medicare HMO | Source: Other Acute Inpatient Hospital | Attending: Physical Medicine & Rehabilitation | Admitting: Physical Medicine & Rehabilitation

## 2022-03-19 ENCOUNTER — Encounter (HOSPITAL_COMMUNITY): Payer: Self-pay | Admitting: Physical Medicine & Rehabilitation

## 2022-03-19 DIAGNOSIS — I5020 Unspecified systolic (congestive) heart failure: Secondary | ICD-10-CM | POA: Diagnosis present

## 2022-03-19 DIAGNOSIS — N186 End stage renal disease: Secondary | ICD-10-CM

## 2022-03-19 DIAGNOSIS — I5022 Chronic systolic (congestive) heart failure: Secondary | ICD-10-CM | POA: Diagnosis present

## 2022-03-19 DIAGNOSIS — L89892 Pressure ulcer of other site, stage 2: Secondary | ICD-10-CM | POA: Diagnosis present

## 2022-03-19 DIAGNOSIS — Z992 Dependence on renal dialysis: Secondary | ICD-10-CM

## 2022-03-19 DIAGNOSIS — E1140 Type 2 diabetes mellitus with diabetic neuropathy, unspecified: Secondary | ICD-10-CM | POA: Diagnosis present

## 2022-03-19 DIAGNOSIS — I739 Peripheral vascular disease, unspecified: Secondary | ICD-10-CM | POA: Diagnosis not present

## 2022-03-19 DIAGNOSIS — L8990 Pressure ulcer of unspecified site, unspecified stage: Secondary | ICD-10-CM | POA: Insufficient documentation

## 2022-03-19 DIAGNOSIS — G4733 Obstructive sleep apnea (adult) (pediatric): Secondary | ICD-10-CM | POA: Diagnosis present

## 2022-03-19 DIAGNOSIS — Z885 Allergy status to narcotic agent status: Secondary | ICD-10-CM | POA: Diagnosis not present

## 2022-03-19 DIAGNOSIS — D631 Anemia in chronic kidney disease: Secondary | ICD-10-CM | POA: Diagnosis present

## 2022-03-19 DIAGNOSIS — G546 Phantom limb syndrome with pain: Secondary | ICD-10-CM | POA: Diagnosis present

## 2022-03-19 DIAGNOSIS — E1122 Type 2 diabetes mellitus with diabetic chronic kidney disease: Secondary | ICD-10-CM | POA: Diagnosis present

## 2022-03-19 DIAGNOSIS — S78111A Complete traumatic amputation at level between right hip and knee, initial encounter: Principal | ICD-10-CM

## 2022-03-19 DIAGNOSIS — Z8379 Family history of other diseases of the digestive system: Secondary | ICD-10-CM | POA: Diagnosis not present

## 2022-03-19 DIAGNOSIS — M898X9 Other specified disorders of bone, unspecified site: Secondary | ICD-10-CM | POA: Diagnosis present

## 2022-03-19 DIAGNOSIS — Z8249 Family history of ischemic heart disease and other diseases of the circulatory system: Secondary | ICD-10-CM | POA: Diagnosis not present

## 2022-03-19 DIAGNOSIS — Z4781 Encounter for orthopedic aftercare following surgical amputation: Principal | ICD-10-CM

## 2022-03-19 DIAGNOSIS — Z79899 Other long term (current) drug therapy: Secondary | ICD-10-CM | POA: Diagnosis not present

## 2022-03-19 DIAGNOSIS — D649 Anemia, unspecified: Secondary | ICD-10-CM | POA: Diagnosis not present

## 2022-03-19 DIAGNOSIS — T148XXA Other injury of unspecified body region, initial encounter: Secondary | ICD-10-CM | POA: Diagnosis not present

## 2022-03-19 DIAGNOSIS — R34 Anuria and oliguria: Secondary | ICD-10-CM | POA: Diagnosis not present

## 2022-03-19 DIAGNOSIS — R6889 Other general symptoms and signs: Secondary | ICD-10-CM | POA: Diagnosis not present

## 2022-03-19 DIAGNOSIS — E669 Obesity, unspecified: Secondary | ICD-10-CM | POA: Diagnosis not present

## 2022-03-19 DIAGNOSIS — Z7982 Long term (current) use of aspirin: Secondary | ICD-10-CM | POA: Diagnosis not present

## 2022-03-19 DIAGNOSIS — L089 Local infection of the skin and subcutaneous tissue, unspecified: Secondary | ICD-10-CM | POA: Diagnosis not present

## 2022-03-19 DIAGNOSIS — I482 Chronic atrial fibrillation, unspecified: Secondary | ICD-10-CM | POA: Diagnosis not present

## 2022-03-19 DIAGNOSIS — Z89611 Acquired absence of right leg above knee: Secondary | ICD-10-CM | POA: Diagnosis not present

## 2022-03-19 DIAGNOSIS — Z7901 Long term (current) use of anticoagulants: Secondary | ICD-10-CM | POA: Diagnosis not present

## 2022-03-19 DIAGNOSIS — E785 Hyperlipidemia, unspecified: Secondary | ICD-10-CM | POA: Diagnosis present

## 2022-03-19 DIAGNOSIS — N2581 Secondary hyperparathyroidism of renal origin: Secondary | ICD-10-CM | POA: Diagnosis present

## 2022-03-19 DIAGNOSIS — I132 Hypertensive heart and chronic kidney disease with heart failure and with stage 5 chronic kidney disease, or end stage renal disease: Secondary | ICD-10-CM | POA: Diagnosis present

## 2022-03-19 DIAGNOSIS — E1169 Type 2 diabetes mellitus with other specified complication: Secondary | ICD-10-CM | POA: Diagnosis not present

## 2022-03-19 LAB — GLUCOSE, CAPILLARY
Glucose-Capillary: 129 mg/dL — ABNORMAL HIGH (ref 70–99)
Glucose-Capillary: 138 mg/dL — ABNORMAL HIGH (ref 70–99)
Glucose-Capillary: 110 mg/dL — ABNORMAL HIGH (ref 70–99)

## 2022-03-19 LAB — HEPATITIS B SURFACE ANTIGEN: Hepatitis B Surface Ag: NONREACTIVE

## 2022-03-19 LAB — CBC
HCT: 24.4 % — ABNORMAL LOW (ref 39.0–52.0)
HCT: 26.8 % — ABNORMAL LOW (ref 39.0–52.0)
Hemoglobin: 7.7 g/dL — ABNORMAL LOW (ref 13.0–17.0)
Hemoglobin: 8.4 g/dL — ABNORMAL LOW (ref 13.0–17.0)
MCH: 27.8 pg (ref 26.0–34.0)
MCH: 27.8 pg (ref 26.0–34.0)
MCHC: 31.6 g/dL (ref 30.0–36.0)
MCHC: 31.3 g/dL (ref 30.0–36.0)
MCV: 88.1 fL (ref 80.0–100.0)
MCV: 88.7 fL (ref 80.0–100.0)
Platelets: 257 10*3/uL (ref 150–400)
Platelets: 265 10*3/uL (ref 150–400)
RBC: 2.77 MIL/uL — ABNORMAL LOW (ref 4.22–5.81)
RBC: 3.02 MIL/uL — ABNORMAL LOW (ref 4.22–5.81)
RDW: 16.6 % — ABNORMAL HIGH (ref 11.5–15.5)
RDW: 16.9 % — ABNORMAL HIGH (ref 11.5–15.5)
WBC: 7.4 10*3/uL (ref 4.0–10.5)
WBC: 7.1 10*3/uL (ref 4.0–10.5)
nRBC: 0 % (ref 0.0–0.2)
nRBC: 0 % (ref 0.0–0.2)

## 2022-03-19 LAB — BASIC METABOLIC PANEL
Anion gap: 9 (ref 5–15)
BUN: 27 mg/dL — ABNORMAL HIGH (ref 6–20)
CO2: 30 mmol/L (ref 22–32)
Calcium: 8 mg/dL — ABNORMAL LOW (ref 8.9–10.3)
Chloride: 95 mmol/L — ABNORMAL LOW (ref 98–111)
Creatinine, Ser: 5.15 mg/dL — ABNORMAL HIGH (ref 0.61–1.24)
GFR, Estimated: 12 mL/min — ABNORMAL LOW (ref >60)
Glucose, Bld: 96 mg/dL (ref 70–99)
Potassium: 3.7 mmol/L (ref 3.5–5.1)
Sodium: 134 mmol/L — ABNORMAL LOW (ref 135–145)

## 2022-03-19 MED ORDER — HYDROCERIN EX CREA
TOPICAL_CREAM | Freq: Two times a day (BID) | CUTANEOUS | Status: DC
  Filled 2022-03-19 (×3): qty 113

## 2022-03-19 MED ORDER — MELATONIN 5 MG PO TABS
5.0000 mg | ORAL_TABLET | Freq: Every evening | ORAL | Status: DC | PRN
  Administered 2022-03-19 – 2022-03-30 (×3): 5 mg via ORAL
  Filled 2022-03-19 (×3): qty 1

## 2022-03-19 MED ORDER — DARBEPOETIN ALFA 100 MCG/0.5ML IJ SOSY
100.0000 ug | PREFILLED_SYRINGE | INTRAMUSCULAR | Status: DC
  Administered 2022-03-20 – 2022-04-03 (×3): 100 ug via SUBCUTANEOUS
  Filled 2022-03-19 (×4): qty 0.5
  Filled 2022-03-19: qty 1.2
  Filled 2022-03-19: qty 0.5

## 2022-03-19 MED ORDER — PROCHLORPERAZINE EDISYLATE 10 MG/2ML IJ SOLN
5.0000 mg | Freq: Four times a day (QID) | INTRAMUSCULAR | Status: DC | PRN
  Filled 2022-03-19: qty 2

## 2022-03-19 MED ORDER — DEXTROSE 50 % IV SOLN: 12.5000 g | INTRAVENOUS | Status: DC | PRN

## 2022-03-19 MED ORDER — OXYCODONE-ACETAMINOPHEN 5-325 MG PO TABS
1.0000 | ORAL_TABLET | ORAL | Status: DC | PRN
  Administered 2022-03-19 – 2022-03-22 (×11): 2 via ORAL
  Administered 2022-03-23: 1 via ORAL
  Administered 2022-03-23 – 2022-03-27 (×15): 2 via ORAL
  Administered 2022-03-27: 1 via ORAL
  Administered 2022-03-27 – 2022-03-28 (×4): 2 via ORAL
  Administered 2022-03-28: 1 via ORAL
  Administered 2022-03-29 – 2022-04-04 (×15): 2 via ORAL
  Filled 2022-03-19 (×48): qty 2

## 2022-03-19 MED ORDER — PENTAFLUOROPROP-TETRAFLUOROETH EX AERO: 1.0000 | INHALATION_SPRAY | CUTANEOUS | Status: DC | PRN

## 2022-03-19 MED ORDER — INSULIN ASPART 100 UNIT/ML IJ SOLN: 0.0000 [IU] | Freq: Three times a day (TID) | INTRAMUSCULAR | Status: DC

## 2022-03-19 MED ORDER — ROSUVASTATIN CALCIUM 5 MG PO TABS
10.0000 mg | ORAL_TABLET | Freq: Every day | ORAL | Status: DC
  Administered 2022-03-20 – 2022-04-04 (×16): 10 mg via ORAL
  Filled 2022-03-19 (×18): qty 2

## 2022-03-19 MED ORDER — MORPHINE SULFATE (PF) 2 MG/ML IV SOLN: 2.0000 mg | INTRAVENOUS | Status: DC | PRN

## 2022-03-19 MED ORDER — RENA-VITE PO TABS
1.0000 | ORAL_TABLET | Freq: Every day | ORAL | Status: DC
  Administered 2022-03-19 – 2022-04-03 (×16): 1 via ORAL
  Filled 2022-03-19 (×17): qty 1

## 2022-03-19 MED ORDER — DIPHENHYDRAMINE HCL 12.5 MG/5ML PO ELIX
12.5000 mg | ORAL_SOLUTION | Freq: Four times a day (QID) | ORAL | Status: DC | PRN
  Administered 2022-03-22 – 2022-04-03 (×6): 25 mg via ORAL
  Filled 2022-03-19 (×6): qty 10

## 2022-03-19 MED ORDER — HEPARIN SODIUM (PORCINE) 1000 UNIT/ML DIALYSIS
1000.0000 [IU] | INTRAMUSCULAR | Status: DC | PRN
  Filled 2022-03-19 (×2): qty 1

## 2022-03-19 MED ORDER — PROCHLORPERAZINE 25 MG RE SUPP: 12.5000 mg | Freq: Four times a day (QID) | RECTAL | Status: DC | PRN

## 2022-03-19 MED ORDER — APIXABAN 2.5 MG PO TABS
2.5000 mg | ORAL_TABLET | Freq: Two times a day (BID) | ORAL | Status: DC
  Administered 2022-03-19 – 2022-04-04 (×32): 2.5 mg via ORAL
  Filled 2022-03-19 (×33): qty 1

## 2022-03-19 MED ORDER — AMIODARONE HCL 200 MG PO TABS
200.0000 mg | ORAL_TABLET | Freq: Two times a day (BID) | ORAL | Status: DC
  Administered 2022-03-19 – 2022-04-04 (×32): 200 mg via ORAL
  Filled 2022-03-19 (×33): qty 1

## 2022-03-19 MED ORDER — BISACODYL 10 MG RE SUPP
10.0000 mg | Freq: Every day | RECTAL | Status: DC | PRN
  Filled 2022-03-19: qty 1

## 2022-03-19 MED ORDER — SIMETHICONE 80 MG PO CHEW: 80.0000 mg | CHEWABLE_TABLET | Freq: Four times a day (QID) | ORAL | Status: DC | PRN

## 2022-03-19 MED ORDER — DARBEPOETIN ALFA 40 MCG/0.4ML IJ SOSY: 40.0000 ug | PREFILLED_SYRINGE | INTRAMUSCULAR | Status: DC

## 2022-03-19 MED ORDER — EPOETIN ALFA 4000 UNIT/ML IJ SOLN: 4000.0000 [IU] | INTRAMUSCULAR | Status: DC

## 2022-03-19 MED ORDER — CHLORHEXIDINE GLUCONATE CLOTH 2 % EX PADS: 6.0000 | MEDICATED_PAD | Freq: Every day | CUTANEOUS | Status: DC

## 2022-03-19 MED ORDER — OXYCODONE-ACETAMINOPHEN 5-325 MG PO TABS: 1.0000 | ORAL_TABLET | ORAL | 0 refills | Status: DC | PRN

## 2022-03-19 MED ORDER — PROCHLORPERAZINE MALEATE 5 MG PO TABS
5.0000 mg | ORAL_TABLET | Freq: Four times a day (QID) | ORAL | Status: DC | PRN
  Administered 2022-04-01 – 2022-04-03 (×2): 10 mg via ORAL
  Filled 2022-03-19 (×2): qty 2

## 2022-03-19 MED ORDER — POLYETHYLENE GLYCOL 3350 17 G PO PACK
17.0000 g | PACK | Freq: Every day | ORAL | Status: DC | PRN
  Administered 2022-03-24: 17 g via ORAL
  Filled 2022-03-19: qty 1

## 2022-03-19 MED ORDER — MILK AND MOLASSES ENEMA: 1.0000 | Freq: Every day | RECTAL | Status: DC | PRN

## 2022-03-19 MED ORDER — TRAZODONE HCL 50 MG PO TABS: 25.0000 mg | ORAL_TABLET | Freq: Every evening | ORAL | Status: DC | PRN

## 2022-03-19 MED ORDER — ACETAMINOPHEN 325 MG PO TABS
325.0000 mg | ORAL_TABLET | ORAL | Status: DC | PRN
  Administered 2022-03-25 – 2022-03-31 (×5): 650 mg via ORAL
  Filled 2022-03-19 (×6): qty 2

## 2022-03-19 MED ORDER — GUAIFENESIN-DM 100-10 MG/5ML PO SYRP: 5.0000 mL | ORAL_SOLUTION | Freq: Four times a day (QID) | ORAL | Status: DC | PRN

## 2022-03-19 MED ORDER — ROSUVASTATIN CALCIUM 10 MG PO TABS: 10.0000 mg | ORAL_TABLET | Freq: Every day | ORAL | Status: DC

## 2022-03-19 MED ORDER — ALUMINUM HYDROXIDE GEL 320 MG/5ML PO SUSP: 10.0000 mL | Freq: Four times a day (QID) | ORAL | Status: DC | PRN

## 2022-03-19 MED ORDER — BUMETANIDE 1 MG PO TABS
1.0000 mg | ORAL_TABLET | Freq: Two times a day (BID) | ORAL | Status: DC
  Administered 2022-03-19 – 2022-04-04 (×32): 1 mg via ORAL
  Filled 2022-03-19 (×33): qty 1

## 2022-03-19 MED ORDER — LIDOCAINE-PRILOCAINE 2.5-2.5 % EX CREA: 1.0000 | TOPICAL_CREAM | CUTANEOUS | Status: DC | PRN

## 2022-03-19 NOTE — Progress Notes (Signed)
  Progress Note    03/19/2022 11:31 AM 6 Days Post-Op  Subjective:  [name] is a 62 year old African-American male who is now status post 5 days from right above-the-knee amputation. He is currently sitting resting in bed this morning. He states after stopping the milk products earlier this week  his diarrhea has resolved. He denies any pain or phantom pains to his right leg. He states he feels much much better overall. He is recovering as expected and states he was told he is being discharged to rehab today.   Vitals:   03/18/22 1900 03/19/22 0755  BP: (!) 137/57 (!) 120/52  Pulse: 70 79  Resp: 20 20  Temp: 97.8 F (36.6 C) 98.2 F (36.8 C)  SpO2: (!) 75% (!) 88%   Physical Exam: Cardiac:  RRR Lungs:  Clear throughout all fields  Incisions:  Incision of stump Clean Dry and intact with staples.  Extremities:  Right Above the Knee Amputation with palpable pulses in left lower extremity Abdomen:  Soft, nontender and nondistended with normal bowel sounds  Neurologic: AAO X3 to person place and time   CBC    Component Value Date/Time   WBC 7.1 03/19/2022 1013   RBC 3.02 (L) 03/19/2022 1013   HGB 8.4 (L) 03/19/2022 1013   HGB 14.4 02/03/2022 1441   HCT 26.8 (L) 03/19/2022 1013   HCT 44.0 02/03/2022 1441   PLT 265 03/19/2022 1013   PLT 200 02/03/2022 1441   MCV 88.7 03/19/2022 1013   MCV 87 02/03/2022 1441   MCH 27.8 03/19/2022 1013   MCHC 31.3 03/19/2022 1013   RDW 16.9 (H) 03/19/2022 1013   RDW 14.8 02/03/2022 1441   LYMPHSABS 0.8 03/05/2022 1323   LYMPHSABS 0.8 02/03/2022 1441   MONOABS 0.7 03/05/2022 1323   EOSABS 0.2 03/05/2022 1323   EOSABS 0.4 02/03/2022 1441   BASOSABS 0.1 03/05/2022 1323   BASOSABS 0.1 02/03/2022 1441    BMET    Component Value Date/Time   NA 134 (L) 03/19/2022 0429   NA 136 02/03/2022 1441   K 3.7 03/19/2022 0429   CL 95 (L) 03/19/2022 0429   CO2 30 03/19/2022 0429   GLUCOSE 96 03/19/2022 0429   BUN 27 (H) 03/19/2022  0429   BUN 35 (H) 02/03/2022 1441   CREATININE 5.15 (H) 03/19/2022 0429   CALCIUM 8.0 (L) 03/19/2022 0429   GFRNONAA 12 (L) 03/19/2022 0429    INR    Component Value Date/Time   INR 1.2 03/05/2022 1943     Intake/Output Summary (Last 24 hours) at 03/19/2022 1131 Last data filed at 03/18/2022 1900 Gross per 24 hour  Intake --  Output 1000 ml  Net -1000 ml     Assessment/Plan:  62 y.o. male is s/p right above-the-knee amputation   6 Days Post-Op   AKA: Routine postop amputation care with physical therapy and Occupational Therapy.  Rehab facilities are being contacted for placement and acceptance.   Diarrhea: Diarrhea has resolved.  Discharge: Patient to be discharged to rehab facility today.    Drema Pry [facility] 03/19/2022 11:31 AM

## 2022-03-19 NOTE — Progress Notes (Signed)
PMR Admission Coordinator Pre-Admission Assessment   Patient: Adam Howell is an 62 y.o., male MRN: [id number] DOB: April 03, 1960 Height: 6' 3" (190.5 cm) Weight: 106.7 kg   Insurance Information HMO: yes    PPO:      PCP:      IPA:      80/20:      OTHER:  PRIMARY: [employer]      Policy#: [id number]      Subscriber: Pt  CM Name: Luster Landsberg       Phone#: [phone]     Fax#: [phone] Pre-Cert#: [id number]      Employer: Pt. Approved for 7 days on 03/18/21 with updates due on 7th day. Pt. With 5 days from 1/3 to admit .  Benefits:  Phone #:      Name:  Irene Shipper Date: 03/16/2022- still active   Deductible: $240 ($0 met) OOP Max: $8,850 ($0 met)   CIR: $2,080 copay/admission SNF: 100% coverage   Outpatient:  80% coverage; 20% co-insurance   Home Health:  100% coverage   DME: 80% coverage; 20% co-insurance   Providers: in network   SECONDARY:  Medicaid [place]      Policy#: [id number]      Phone#:    Financial Counselor:       Phone#:    The "Data Collection Information Summary" for patients in Inpatient Rehabilitation Facilities with attached "Privacy Act [place] Records" was provided and verbally reviewed with: Pt   Emergency Contact Information Contact Information       Name Relation Home Work Mobile    speel,michelle Daughter     [phone]           Current Medical History  Patient Admitting Diagnosis: R AKA, encephalopathy   History of Present Illness: Adam Howell is a 62 y.o. male with medical history significant for diabetes mellitus with complications of end-stage renal disease on hemodialysis (M/W/F), history of A-fib on Eliquis and amiodarone, HFpEF (EF 35 to 40% 2021), secondary hyperparathyroidism with chronic nonhealing right lower extremity wound for which he was hospitalized for debridement and IV antibiotics from 10/31 to 11/6.  He was sent to the [facility] ED 02/2122 by his home health nurse due  to concerns for increased drainage and foul odor from the wound in spite of ongoing dressings.  MRI right tib-fib suggesting myositis without osteomyelitis. Patient was started on Zosyn and vancomycin.  Patient was taken to the OR for debridement on 03/07/22.  Wound culture only grew out gram-negative rods, antibiotics switched to Zosyn only.  Patient had a prolonged episode of altered mental status postop, currently improved.  Had another episode of decreased responsiveness on 12/26.  Seen by neurology, most likely metabolic encephalopathy.  MRI of the brain reported as new stroke, neurology has reviewed it, does not believe this is stroke.  Patient had right-sided above-knee amputation 03/13/22. Pt. Seen by PT/OT who recommend CIR to assist return to PLOF.Marland Kitchen    Patient's medical record from [facility] has been reviewed by the rehabilitation admission coordinator and physician.   Past Medical History      Past Medical History:  Diagnosis Date   Chronic kidney disease     Congestive heart failure ([place])     Diabetes mellitus without complication ([place])     Difficult intubation     Hyperlipidemia     Hypertension        Has the patient had major surgery during 100  days prior to admission? Yes   Family History   family history includes Cirrhosis in his father; Heart failure in his mother; Hypertension in his daughter.   Current Medications   Current Facility-Administered Medications:    acetaminophen (TYLENOL) tablet 650 mg, 650 mg, Oral, Q6H PRN, 650 mg at 03/18/22 1644 **OR** acetaminophen (TYLENOL) suppository 650 mg, 650 mg, Rectal, Q6H PRN, Dew, Erskine Squibb, MD   alteplase (CATHFLO ACTIVASE) injection 2 mg, 2 mg, Intracatheter, Once PRN, Lucky Cowboy, Erskine Squibb, MD   amiodarone (PACERONE) tablet 200 mg, 200 mg, Oral, BID, Lucky Cowboy, Erskine Squibb, MD, 200 mg at 03/19/22 1610   anticoagulant sodium citrate solution 5 mL, 5 mL, Intracatheter, PRN, Lucky Cowboy, Erskine Squibb, MD   apixaban Arne [place]) tablet  2.5 mg, 2.5 mg, Oral, BID, Dew, Erskine Squibb, MD, 2.5 mg at 03/19/22 0921   bumetanide (BUMEX) tablet 1 mg, 1 mg, Oral, BID, Dew, Erskine Squibb, MD, 1 mg at 03/19/22 9604   Chlorhexidine Gluconate Cloth 2 % PADS 6 each, 6 each, Topical, Q0600, Algernon Huxley, MD, 6 each at 03/19/22 0609   dextrose 50 % solution 12.5 g, 12.5 g, Intravenous, PRN, Algernon Huxley, MD   [START ON 03/20/2022] epoetin alfa (EPOGEN) injection 4,000 Units, 4,000 Units, Intravenous, Q M,W,F-HD, Breeze, Shantelle, NP   heparin injection 1,000 Units, 1,000 Units, Intracatheter, PRN, Dew, Erskine Squibb, MD   lidocaine (PF) (XYLOCAINE) 1 % injection 5 mL, 5 mL, Intradermal, PRN, Lucky Cowboy, Erskine Squibb, MD   lidocaine-prilocaine (EMLA) cream 1 Application, 1 Application, Topical, PRN, Lucky Cowboy, Erskine Squibb, MD   loperamide (IMODIUM) capsule 2 mg, 2 mg, Oral, PRN, Sharen Hones, MD, 2 mg at 03/17/22 1514   melatonin tablet 5 mg, 5 mg, Oral, QHS PRN, Sharion Settler, NP, 5 mg at 03/18/22 2146   morphine (PF) 2 MG/ML injection 2 mg, 2 mg, Intravenous, Q3H PRN, Algernon Huxley, MD   multivitamin (RENA-VIT) tablet 1 tablet, 1 tablet, Oral, QHS, Dew, Erskine Squibb, MD, 1 tablet at 03/18/22 2146   ondansetron (ZOFRAN) tablet 4 mg, 4 mg, Oral, Q6H PRN **OR** ondansetron (ZOFRAN) injection 4 mg, 4 mg, Intravenous, Q6H PRN, Dew, Erskine Squibb, MD   oxyCODONE-acetaminophen (PERCOCET/ROXICET) 5-325 MG per tablet 1-2 tablet, 1-2 tablet, Oral, Q4H PRN, Sharen Hones, MD, 1 tablet at 03/19/22 0609   pentafluoroprop-tetrafluoroeth (GEBAUERS) aerosol 1 Application, 1 Application, Topical, PRN, Algernon Huxley, MD, 1 Application at 54/09/81 0916   rosuvastatin (CRESTOR) tablet 10 mg, 10 mg, Oral, Daily, Sharen Hones, MD, 10 mg at 03/19/22 1914   Patients Current Diet:  Diet Order                  Diet renal with fluid restriction Fluid restriction: 1200 mL Fluid; Room service appropriate? Yes; Fluid consistency: Thin  Diet effective now                         Precautions /  Restrictions Precautions Precautions: Fall Precaution Comments: s/p R AKA Restrictions Weight Bearing Restrictions: Yes RLE Weight Bearing: Non weight bearing    Has the patient had 2 or more falls or a fall with injury in the past year? No   Prior Activity Level Community (5-7x/wk): Pt active in the community PTA   Prior Functional Level Self Care: Did the patient need help bathing, dressing, using the toilet or eating? Independent   Indoor Mobility: Did the patient need assistance with walking from room to room (with or without device)? Independent  Stairs: Did the patient need assistance with internal or external stairs (with or without device)? Independent   Functional Cognition: Did the patient need help planning regular tasks such as shopping or remembering to take medications? Independent   Patient Information Are you of Hispanic, Latino/a,or Spanish origin?: A. No, not of Hispanic, Latino/a, or Spanish origin What is your race?: B. Black or African American Do you need or want an interpreter to communicate with a doctor or health care staff?: 0. No   Patient's Response To:  Health Literacy and Transportation Is the patient able to respond to health literacy and transportation needs?: Yes Health Literacy - How often do you need to have someone help you when you read instructions, pamphlets, or other written material from your doctor or pharmacy?: Sometimes In the past 12 months, has lack of transportation kept you from medical appointments or from getting medications?: No In the past 12 months, has lack of transportation kept you from meetings, work, or from getting things needed for daily living?: No   Home Assistive Devices / [place] Devices/Equipment: Environmental consultant (specify type) Home Equipment: Rolling Walker (2 wheels)   Prior Device Use: Indicate devices/aids used by the patient prior to current illness, exacerbation or injury? Walker   Current  Functional Level Cognition   Overall Cognitive Status: No family/caregiver present to determine baseline cognitive functioning Orientation Level: Oriented X4 Following Commands: Follows one step commands consistently, Follows one step commands with increased time Safety/Judgement: Decreased awareness of deficits General Comments: [name] or speak voluntarorly. Was cooperative but Pryor Curia questions if he fully understand current scope of situation.    Extremity Assessment (includes Sensation/Coordination)   Upper Extremity Assessment: Defer to OT evaluation  Lower Extremity Assessment: Generalized weakness, RLE deficits/detail RLE Deficits / Details: new R AKA     ADLs   Overall ADL's : Needs assistance/impaired Grooming: Wash/dry hands, Wash/dry face, Sitting, Set up Upper Body Dressing : With caregiver independent assisting Upper Body Dressing Details (indicate cue type and reason): gown seated in chair Lower Body Dressing: Moderate assistance Lower Body Dressing Details (indicate cue type and reason): pt doff sock with MIN A, donn with MOD A Toileting- Clothing Manipulation and Hygiene: Maximal assistance General ADL Comments: STS 5x with MAX A to approx 1/2 full upright standing with RW; changed socked, additional 3 attempts with pt able to stand up to 3/4 full upright standing with RW     Mobility   Overal bed mobility: Needs Assistance Bed Mobility: Supine to Sit Supine to sit: Mod assist Sit to supine: Mod assist, +2 for physical assistance General bed mobility comments: NT in recliner pre/post session     Transfers   Overall transfer level: Needs assistance Equipment used: Rolling walker (2 wheels) Transfers: Sit to/from Stand Sit to Stand: Max assist (from chair) Bed to/from chair/wheelchair/BSC transfer type:: Lateral/scoot transfer  Lateral/Scoot Transfers: Min assist General transfer comment: Mod  assist to stand from elevated bed height. Vcs for technique and sequencing. author protected L knee to prevent buckling however none observed. [name]     Ambulation / Gait / Stairs / Wheelchair Mobility   Ambulation/Gait Ambulation/Gait assistance: Mod assist Gait Distance (Feet): 3 Feet Assistive device: Rolling walker (2 wheels) Gait Pattern/deviations:  (" hop to") General Gait Details: [name] but fatigued quickly resulting in need to pivot the rest of the  way to recliner. Gait velocity: decreased     Posture / Balance Dynamic Sitting Balance Sitting balance - Comments: for seated ADLs Balance Overall balance assessment: Needs assistance Sitting-balance support: Bilateral upper extremity supported Sitting balance-Leahy Scale: Good Sitting balance - Comments: for seated ADLs Standing balance support: Bilateral upper extremity supported, During functional activity, Reliant on assistive device for balance Standing balance-Leahy Scale: Fair Standing balance comment: limited standing endurance however did well with BUE support     Special needs/care consideration Skin surgical incision  and Special service needs HD on M-W-F    Previous Home Environment (from acute therapy documentation) Living Arrangements: Other relatives (daughter) Available Help at Discharge: Family, Available 24 hours/day Type of Home: Apartment Home Layout: One level Home Access: Level entry Bathroom Shower/Tub: Chiropodist: Handicapped height Home Care Services: No Additional Comments: Pt reports he lives with daughter who does not work   Nurse, learning disability for Discharge Living Setting: Patient's home Type of Home at Discharge: Apartment Discharge Home Layout: One level Discharge Home Access: Level entry Entrance Stairs-Rails: None Discharge Bathroom Shower/Tub: [place] unit Discharge Bathroom Toilet:  Standard Discharge Bathroom Accessibility: Yes How Accessible: Accessible via walker, Accessible via wheelchair Does the patient have any problems obtaining your medications?: No   Social/Family/Support Systems Patient Roles: Other (Comment) Contact Information: [phone] Anticipated Caregiver: [name] (daughter) Ability/Limitations of Caregiver: Min A Caregiver Availability: 24/7   Goals Patient/Family Goal for Rehab: PT/OT Min A Expected length of stay: 18-21 days Pt/Family Agrees to Admission and willing to participate: Yes Program Orientation Provided & Reviewed with Pt/Caregiver Including Roles  & Responsibilities: Yes   Decrease burden of Care through IP rehab admission: none    Possible need for SNF placement upon discharge: Not anticipated   Patient Condition: I have reviewed medical records from [facility] , spoken with CM, and patient. I discussed via phone for inpatient rehabilitation assessment.  Patient will benefit from ongoing PT and OT, can actively participate in 3 hours of therapy a day 5 days of the week, and can make measurable gains during the admission.  Patient will also benefit from the coordinated team approach during an Inpatient Acute Rehabilitation admission.  The patient will receive intensive therapy as well as Rehabilitation physician, nursing, social worker, and care management interventions.  Due to safety, skin/wound care, disease management, medication administration, pain management, and patient education the patient requires 24 hour a day rehabilitation nursing.  The patient is currently mod to max assist with mobility and basic ADLs.  Discharge setting and therapy post discharge at home with home health is anticipated.  Patient has agreed to participate in the Acute Inpatient Rehabilitation Program and will admit today.   Preadmission Screen Completed By:  Retta Diones, 03/19/2022 10:36  AM ______________________________________________________________________   Discussed status with Dr. Naaman Plummer on 03/19/22 at 0945 and received approval for admission today.   Admission Coordinator:  Retta Diones, RN, time 1035/Date 03/19/22    Assessment/Plan: Diagnosis: Does the need for close, 24 hr/day Medical supervision in concert with the patient's rehab needs make it unreasonable for this patient to be served in a less intensive setting? Yes Co-Morbidities requiring supervision/potential complications: DM, ESRD HD M/W/F, sCHF EF 35-40%; R AKA- encephalopathy- improving Due to bladder management, bowel management, safety, skin/wound care, disease management, medication administration, pain management, and patient education, does the patient require 24 hr/day rehab nursing? Yes Does the patient require coordinated care of a physician, rehab nurse, PT, OT,  and SLP to address physical and functional deficits in the context of the above medical diagnosis(es)? Yes Addressing deficits in the following areas: balance, endurance, locomotion, strength, transferring, bowel/bladder control, bathing, dressing, feeding, grooming, toileting, and cognition Can the patient actively participate in an intensive therapy program of at least 3 hrs of therapy 5 days a week? Yes The potential for patient to make measurable gains while on inpatient rehab is good Anticipated functional outcomes upon discharge from inpatient rehab: min assist PT, min assist OT,  not sure if will need  SLP- at w/c level Estimated rehab length of stay to reach the above functional goals is: 18-21 days Anticipated discharge destination: Home 10. Overall Rehab/Functional Prognosis: good     MD Signature:           Revision History

## 2022-03-19 NOTE — H&P (Signed)
Physical Medicine and Rehabilitation Admission H&P        Chief Complaint  Patient presents with   Functional deficits due to AKA      HPI:  Adam Howell is a 62 year old R handed   male with history of HTN, T2DM with neuropathy, ESRD- on HD MWF, RLE wound with progressive decline and ulcer to bone with copious drainage- myositis, not osteomyelitis- non healing wound. He was admitted to [facility] on 03/06/22 and started on IV for due to concerns of osteo. He underwent I and D with attempts at limb salvage. Post procedure, he had issues with hypotension as well as hypoxemia.  He had transient AMS on 12/23 and recurrent episode on 12/26 with flaccid UE and was poorly responsive. Code stroke initiated and MRI brain done showing 2 small infarcts acute or subacute in right cerebral white matter.  Dr. Cheral Marker felt that twitching of UE most consistent with asterixis with exam consistent with acute encephalopathy. Severe hypotension could also present with AMS. Neurontin held, EEG done showing severe diffuse encephalopathy without seizures. Recs to keep SBP at least 120/80 and worsening of  MS with myoclonic twitching felt to be due to Uremic encephalopathy with CO2 narcosis due to multiple opioids rather than meningitis/encephalitis.       ID consulted for input due on encephalitis v/s encephalopathy and did not feel that clinical picture s/o meningitis but likely CO2 narcosis w/underlying sleep apnea compounded by lack of dialysis for 5 days, infected wound as well as gabapentin/ativan. Vascular recommended AKA and as mentation improved, patient declined amputation with comfort care recommended on 12/28.  After family discussion, patient was agreeable and underwent L-AKA on 12/29 by Dr. Lucky Cowboy. Post op mentation improving without focal deficits and HD ongoing.  Meropenum and Linezolid d/c 48  hrs after surgery per ID input.  PT/OT has been working with patient who continues to be limited by impaired  mobility and ADLs.      Pt reports LBM yesterday- had been having some diarrhea, but has resolved- got smeds to make it stop per pt.  Still oliguric, not anuric- forms >100cc/day- 1x/day.  No N/V Pain "OK"- right now 8/10- but has been ~ 9/10- where leg missing more than residual limb pain- feels like leg still there.    On VRE contact precautions.    Review of Systems  Constitutional:  Positive for malaise/fatigue. Negative for chills and weight loss.  HENT: Negative.    Eyes:  Negative for double vision, photophobia and pain.  Respiratory:  Negative for cough, sputum production and shortness of breath.   Cardiovascular:  Negative for chest pain and palpitations.  Gastrointestinal:  Negative for abdominal pain, constipation, nausea and vomiting.  Genitourinary:  Negative for dysuria and frequency.       Oliguric- forms urine ~ 1x/day- <100cc  Musculoskeletal:  Positive for joint pain. Negative for back pain, falls and neck pain.  Skin: Negative.   Neurological:  Positive for sensory change and weakness. Negative for dizziness, speech change and focal weakness.  Endo/Heme/Allergies: Negative.   Psychiatric/Behavioral:  Negative for depression, substance abuse and suicidal ideas. The patient is not nervous/anxious.   All other systems reviewed and are negative.           Past Medical History:  Diagnosis Date   Chronic kidney disease     Congestive heart failure ([place])     Diabetes mellitus without complication ([place])     Difficult intubation  Hyperlipidemia     Hypertension             Past Surgical History:  Procedure Laterality Date   AMPUTATION Right 03/13/2022    Procedure: AMPUTATION ABOVE KNEE;  Surgeon: Algernon Huxley, MD;  Location: [facility] ORS;  Service: General;  Laterality: Right;   APPLICATION OF WOUND VAC Right 03/07/2022    Procedure: APPLICATION OF WOUND VAC;  Surgeon: Evaristo Bury, MD;  Location: [facility] ORS;  Service: Vascular;  Laterality: Right;  [place] [id number]    WOUND DEBRIDEMENT Left     WOUND DEBRIDEMENT Right 01/16/2022    Procedure: DEBRIDEMENT WOUND;  Surgeon: Katha Cabal, MD;  Location: [facility] ORS;  Service: Vascular;  Laterality: Right;  Wound vac placement   WOUND DEBRIDEMENT Right 03/07/2022    Procedure: DEBRIDEMENT WOUND RIGHT LOWER EXTREMITY;  Surgeon: Evaristo Bury, MD;  Location: [facility] ORS;  Service: Vascular;  Laterality: Right;           Family History  Problem Relation Age of Onset   Heart failure Mother     Cirrhosis Father     Hypertension Daughter        Social History:  reports that he has never smoked. He has never used smokeless tobacco. He reports that he does not currently use alcohol. He reports that he does not use drugs.         Allergies  Allergen Reactions   Tramadol Rash            Medications Prior to Admission  Medication Sig Dispense Refill   HYDROcodone-acetaminophen (NORCO) 10-325 MG tablet Take 1 tablet by mouth every 8 (eight) hours as needed. Do not take more than is ordered and bring pill bottle to next provider visit. 90 tablet 0   insulin aspart (NOVOLOG) 100 UNIT/ML FlexPen Inject 3 Units into the skin 3 (three) times daily with meals. As needed only if blood sugar greater then 130 prior to meal. 15 mL 2   losartan (COZAAR) 25 MG tablet Take 1 tablet (25 mg total) by mouth daily. 30 tablet 0   rosuvastatin (CRESTOR) 40 MG tablet Take 1 tablet (40 mg total) by mouth daily. 90 tablet 4   sevelamer carbonate (RENVELA) 800 MG tablet Take 2 tablets (1,600 mg total) by mouth 3 (three) times daily. 360 tablet 4   tiZANidine (ZANAFLEX) 2 MG tablet TAKE 1 TABLET BY MOUTH ONCE DAILY AS NEEDED FOR MUSCLE SPASM 30 tablet 4   amiodarone (PACERONE) 200 MG tablet Take 1 tablet (200 mg total) by mouth 2 (two) times daily. (Patient not taking: Reported on 03/06/2022) 180 tablet 4   bumetanide (BUMEX) 1 MG tablet Take 1 tablet (1 mg total) by mouth 2 (two) times daily. (Patient not taking: Reported on  03/06/2022) 90 tablet 4   carvedilol (COREG) 3.125 MG tablet Take 1 tablet (3.125 mg total) by mouth 2 (two) times daily. (Patient not taking: Reported on 03/06/2022) 180 tablet 4   ELIQUIS 2.5 MG TABS tablet Take 1 tablet (2.5 mg total) by mouth 2 (two) times daily. Sig: Daily as presciribed. (Patient not taking: Reported on 03/06/2022) 180 tablet 4   gabapentin (NEURONTIN) 300 MG capsule Take 1 capsule (300 mg total) by mouth 2 (two) times daily. (Patient not taking: Reported on 03/06/2022) 180 capsule 4   isosorbide mononitrate (IMDUR) 30 MG 24 hr tablet Take 1 tablet (30 mg total) by mouth daily. (Patient not taking: Reported on 03/06/2022) 90 tablet 4   NIFEdipine (PROCARDIA-XL/NIFEDICAL-XL)  30 MG 24 hr tablet Take 1 tablet (30 mg total) by mouth daily. (Patient not taking: Reported on 03/06/2022) 90 tablet 4   sertraline (ZOLOFT) 50 MG tablet Take 50 mg by mouth daily. (Patient not taking: Reported on 03/06/2022)              Home: Home Living Family/patient expects to be discharged to:: Private residence Living Arrangements: Other relatives (daughter) Available Help at Discharge: Family, Available 24 hours/day Type of Home: Apartment Home Access: Level entry Home Layout: One level Bathroom Shower/Tub: Chiropodist: Handicapped height Home Equipment: Conservation officer, nature (2 wheels) Additional Comments: Pt reports he lives with daughter who does not work   Functional History: Prior Function Prior Level of Function : Independent/Modified Independent Mobility Comments: Mod Ind amb with a RW community distances, no AD for household ambulation, no fall history ADLs Comments: generally MOD I-I in ADL, some assist for IADLs   Functional Status:  Mobility: Bed Mobility Overal bed mobility: Needs Assistance Bed Mobility: Supine to Sit Supine to sit: Mod assist Sit to supine: Mod assist, +2 for physical assistance General bed mobility comments: NT in recliner pre/post  session Transfers Overall transfer level: Needs assistance Equipment used: Rolling walker (2 wheels) Transfers: Sit to/from Stand Sit to Stand: Max assist (from chair) Bed to/from chair/wheelchair/BSC transfer type:: Lateral/scoot transfer  Lateral/Scoot Transfers: Min assist General transfer comment: Mod assist to stand from elevated bed height. Vcs for technique and sequencing. author protected L knee to prevent buckling however none observed. [name] Ambulation/Gait Ambulation/Gait assistance: Mod assist Gait Distance (Feet): 3 Feet Assistive device: Rolling walker (2 wheels) Gait Pattern/deviations:  (" hop to") General Gait Details: [name] but fatigued quickly resulting in need to pivot the rest of the way to recliner. Gait velocity: decreased   ADL: ADL Overall ADL's : Needs assistance/impaired Grooming: Wash/dry hands, Wash/dry face, Sitting, Set up Upper Body Dressing : With caregiver independent assisting Upper Body Dressing Details (indicate cue type and reason): gown seated in chair Lower Body Dressing: Moderate assistance Lower Body Dressing Details (indicate cue type and reason): pt doff sock with MIN A, donn with MOD A Toileting- Clothing Manipulation and Hygiene: Maximal assistance General ADL Comments: STS 5x with MAX A to approx 1/2 full upright standing with RW; changed socked, additional 3 attempts with pt able to stand up to 3/4 full upright standing with RW   Cognition: Cognition Overall Cognitive Status: No family/caregiver present to determine baseline cognitive functioning Orientation Level: Oriented X4 Cognition Arousal/Alertness: Awake/alert Behavior During Therapy: Flat affect Overall Cognitive Status: No family/caregiver present to determine baseline cognitive functioning Area of Impairment: Orientation, Following commands, Safety/judgement, Awareness, Problem solving Orientation Level: Disoriented  to, Time Following Commands: Follows one step commands consistently, Follows one step commands with increased time Safety/Judgement: Decreased awareness of deficits Awareness: Emergent Problem Solving: Slow processing, Decreased initiation, Requires verbal cues, Requires tactile cues General Comments: [name] or speak voluntarorly. Was cooperative but Pryor Curia questions if he fully understand current scope of situation.     Blood pressure (!) 120/52, pulse 79, temperature 98.2 F (36.8 C), temperature source Oral, resp. rate 20, height 6\' 3"  (1.905 m), weight 106.7 kg, SpO2 (!) 88 %. Physical Exam Vitals and nursing note reviewed. Exam conducted with a chaperone present.  Constitutional:      Comments: Awake, alert, appropriate, laying supine  in bed; nursing at bedside; appears younger than stated age, on phone intermittently with wife in [place], NAD  HENT:     Head: Normocephalic and atraumatic.     Comments: No facial droop    Right Ear: External ear normal.     Left Ear: External ear normal.     Nose: Nose normal. No congestion.     Mouth/Throat:     Mouth: Mucous membranes are moist.     Pharynx: Oropharynx is clear. No oropharyngeal exudate.  Eyes:     General:        Right eye: No discharge.        Left eye: No discharge.     Extraocular Movements: Extraocular movements intact.  Cardiovascular:     Rate and Rhythm: Normal rate. Rhythm irregular.     Heart sounds: Normal heart sounds. No murmur heard.    No gallop.  Pulmonary:     Effort: Pulmonary effort is normal. No respiratory distress.     Breath sounds: Normal breath sounds. No wheezing, rhonchi or rales.  Abdominal:     General: There is no distension.     Palpations: Abdomen is soft.     Tenderness: There is no abdominal tenderness.     Comments: Slightly hypoactive  Musculoskeletal:        General: Normal range of motion.     Cervical back: Neck  supple. No tenderness.     Comments: UE strength  5/5 B/L RLE HF 3+/5, otherwise has R AKA LLE- 5/5 in HF, KE, DF and PF R AKA  Skin:    General: Skin is warm and dry.     Comments: Incision looks well- no drainage- dog ears intact; staples intact, no warmth of redness Fistula L forearm (+) has thrill Extremely dry skin on LLE/foot-scaly  Neurological:     Mental Status: He is oriented to person, place, and time.     Comments: Intact to light touch per pt in B/L LE's   Psychiatric:        Mood and Affect: Mood normal.        Behavior: Behavior normal.    Took picture, but doesn't show up in chart, of note!-    Lab Results Last 48 Hours        Results for orders placed or performed during the hospital encounter of 03/05/22 (from the past 48 hour(s))  Hemoglobin and hematocrit, blood     Status: Abnormal    Collection Time: 03/17/22 12:06 PM  Result Value Ref Range    Hemoglobin 8.8 (L) 13.0 - 17.0 g/dL      Comment: REPEATED TO VERIFY RESULTS VERIFIED VIA RECOLLECT      HCT 27.7 (L) 39.0 - 52.0 %      Comment: Performed at [facility], [address] [place], [place] [address]  Glucose, capillary     Status: Abnormal    Collection Time: 03/17/22  4:35 PM  Result Value Ref Range    Glucose-Capillary 138 (H) 70 - 99 mg/dL      Comment: Glucose reference range applies only to samples taken after fasting for at least 8 hours.  Glucose, capillary     Status: Abnormal    Collection Time: 03/17/22  9:46 PM  Result Value Ref Range    Glucose-Capillary 142 (H) 70 - 99 mg/dL      Comment: Glucose reference range applies only to samples taken after fasting for at least 8 hours.  Glucose, capillary  Status: None    Collection Time: 03/18/22  7:32 AM  Result Value Ref Range    Glucose-Capillary 98 70 - 99 mg/dL      Comment: Glucose reference range applies only to samples taken after fasting for at least 8 hours.    Comment 1 Notify RN      Comment 2 Document in Chart     Glucose, capillary     Status: Abnormal    Collection Time: 03/18/22 11:28 AM  Result Value Ref Range    Glucose-Capillary 101 (H) 70 - 99 mg/dL      Comment: Glucose reference range applies only to samples taken after fasting for at least 8 hours.    Comment 1 Notify RN      Comment 2 Document in Chart    CBC     Status: Abnormal    Collection Time: 03/18/22  3:52 PM  Result Value Ref Range    WBC 7.9 4.0 - 10.5 K/uL    RBC 2.88 (L) 4.22 - 5.81 MIL/uL    Hemoglobin 8.1 (L) 13.0 - 17.0 g/dL    HCT 25.1 (L) 39.0 - 52.0 %    MCV 87.2 80.0 - 100.0 fL    MCH 28.1 26.0 - 34.0 pg    MCHC 32.3 30.0 - 36.0 g/dL    RDW 16.8 (H) 11.5 - 15.5 %    Platelets 293 150 - 400 K/uL    nRBC 0.0 0.0 - 0.2 %      Comment: Performed at [facility], [address] [place], [place] [address]  Renal function panel     Status: Abnormal    Collection Time: 03/18/22  3:55 PM  Result Value Ref Range    Sodium 130 (L) 135 - 145 mmol/L    Potassium 3.7 3.5 - 5.1 mmol/L    Chloride 91 (L) 98 - 111 mmol/L    CO2 28 22 - 32 mmol/L    Glucose, Bld 133 (H) 70 - 99 mg/dL      Comment: Glucose reference range applies only to samples taken after fasting for at least 8 hours.    BUN 37 (H) 6 - 20 mg/dL    Creatinine, Ser 7.12 (H) 0.61 - 1.24 mg/dL    Calcium 8.0 (L) 8.9 - 10.3 mg/dL    Phosphorus 4.0 2.5 - 4.6 mg/dL    Albumin 2.3 (L) 3.5 - 5.0 g/dL    GFR, Estimated 8 (L) >60 mL/min      Comment: (NOTE) Calculated using the CKD-EPI Creatinine Equation (2021)      Anion gap 11 5 - 15      Comment: Performed at [facility], [address] [place], [place] [address]  Glucose, capillary     Status: None    Collection Time: 03/18/22 10:16 PM  Result Value Ref Range    Glucose-Capillary 98 70 - 99 mg/dL      Comment: Glucose reference range applies only to samples taken after fasting for at least 8 hours.  CBC     Status: Abnormal    Collection Time: 03/19/22  4:29 AM  Result Value Ref  Range    WBC 7.4 4.0 - 10.5 K/uL    RBC 2.77 (L) 4.22 - 5.81 MIL/uL    Hemoglobin 7.7 (L) 13.0 - 17.0 g/dL    HCT 24.4 (L) 39.0 - 52.0 %    MCV 88.1 80.0 - 100.0 fL    MCH 27.8 26.0 - 34.0 pg  MCHC 31.6 30.0 - 36.0 g/dL    RDW 16.6 (H) 11.5 - 15.5 %    Platelets 257 150 - 400 K/uL    nRBC 0.0 0.0 - 0.2 %      Comment: Performed at [facility], [address] [place], [place] [address]  Basic metabolic panel     Status: Abnormal    Collection Time: 03/19/22  4:29 AM  Result Value Ref Range    Sodium 134 (L) 135 - 145 mmol/L    Potassium 3.7 3.5 - 5.1 mmol/L    Chloride 95 (L) 98 - 111 mmol/L    CO2 30 22 - 32 mmol/L    Glucose, Bld 96 70 - 99 mg/dL      Comment: Glucose reference range applies only to samples taken after fasting for at least 8 hours.    BUN 27 (H) 6 - 20 mg/dL    Creatinine, Ser 5.15 (H) 0.61 - 1.24 mg/dL    Calcium 8.0 (L) 8.9 - 10.3 mg/dL    GFR, Estimated 12 (L) >60 mL/min      Comment: (NOTE) Calculated using the CKD-EPI Creatinine Equation (2021)      Anion gap 9 5 - 15      Comment: Performed at [facility], [address] [place], [place] [address]  Glucose, capillary     Status: Abnormal    Collection Time: 03/19/22  7:57 AM  Result Value Ref Range    Glucose-Capillary 129 (H) 70 - 99 mg/dL      Comment: Glucose reference range applies only to samples taken after fasting for at least 8 hours.    Comment 1 Notify RN      Comment 2 Document in Chart    CBC     Status: Abnormal    Collection Time: 03/19/22 10:13 AM  Result Value Ref Range    WBC 7.1 4.0 - 10.5 K/uL    RBC 3.02 (L) 4.22 - 5.81 MIL/uL    Hemoglobin 8.4 (L) 13.0 - 17.0 g/dL    HCT 26.8 (L) 39.0 - 52.0 %    MCV 88.7 80.0 - 100.0 fL    MCH 27.8 26.0 - 34.0 pg    MCHC 31.3 30.0 - 36.0 g/dL    RDW 16.9 (H) 11.5 - 15.5 %    Platelets 265 150 - 400 K/uL    nRBC 0.0 0.0 - 0.2 %      Comment: Performed at [facility], [address], [place], [place]  [address]      Imaging Results (Last 48 hours)  No results found.         Blood pressure (!) 120/52, pulse 79, temperature 98.2 F (36.8 C), temperature source Oral, resp. rate 20, height 6\' 3"  (1.905 m), weight 106.7 kg, SpO2 (!) 88 %.   Medical Problem List and Plan: 1. Functional deficits secondary to R AKA in setting of non healing wound on RLE             -patient may  shower if covers R AKA incision             -ELOS/Goals: 18-21 days min A to supervision at w/c level- pt advised will likely not be walking when leaves, esp due to ESRD and reduced EF 2.  Antithrombotics: -DVT/anticoagulation:  Pharmaceutical: Eliquis             -antiplatelet therapy: N/A 3. Pain Management:  Fentanyl 25 mcg patch w/oxycodone prn.               --  monitor for sedative SE.  4. Mood/Behavior/Sleep: LCSW to follow for evaluation and support.             --melatonin prn for insomnia.              -antipsychotic agents: N/A 5. Neuropsych/cognition: This patient is capable of making decisions on his own behalf. 6. Skin/Wound Care: Monitor wound for healing. R AKA- ordered shrinker- con't staples- healing well- ordered ACE wrap for now- Eucerin ordered for dry/scaly L foot 7. Fluids/Electrolytes/Nutrition: Strict I/O w/1200 cc FR/Day 8. T2DM: Hgb A1C-6.1 and diet controlled             --monitor BS ac/hs and use SSI for elevated BS. - ordered extra sensnitve SSI 9. ESRD: HD MWF at the end of the day to help with tolerance of activity             --daily weights w/FR and renal diet 10. OSA: continue BIPAP 11. Acute on chronic anemia of chronic disease: Continue to monitor H/H.              --H/H improved to 8.4 12. Chronic systolic CHF: Daily wt S/5681 cc FR/day. Continue Bumex and Crestor.  --Fluid status managed with HD 13. Metabolic encephalopathy: Encourage CPAP use             --limit neuro sedating medications.  14. CAF: Monitor HR TID--continue amiodarone.  15. Diarrhea: Related to milk products  and has resolved. 16. VRE contact precautions       I have personally performed a face to face diagnostic evaluation of this patient and formulated the key components of the plan.  Additionally, I have personally reviewed laboratory data, imaging studies, as well as relevant notes and concur with the physician assistant's documentation above.   The patient's status has not changed from the original H&P.  Any changes in documentation from the acute care chart have been noted above.               Bary Leriche, PA-C 03/19/2022

## 2022-03-19 NOTE — Care Management Important Message (Signed)
Important Message  Patient Details  Name: Adam Howell MRN: [id number] Date of Birth: May 22, 1960   Medicare Important Message Given:  Yes  Patient is in an isolation room so I reviewed his Important Message from Medicare with him by phone [phone]) and he is agreement with his discharge. I wished him a speedy recovery and thanked for his time.   Juliann Pulse A Nazair Fortenberry 03/19/2022, 11:39 AM

## 2022-03-19 NOTE — Progress Notes (Signed)
Met with patient. Oriented to rehab. Educated on team conference every Tuesday. Will discuss d/c date, barriers, and goals. Does take insulin at home but reports that doesn't check blood sugar. Asked if has glucometer and he said "it's somewhere". Said that when in dialysis they check for him. Reports that also checks his weight at home sometimes. Informed that added information sheet for CHF to record daily weights and can monitor with green/yellow/red zone for issue. Discussed renal diet but he reports that has been on dialysis for some years now. Patient also has two skin tears to right and left buttocks. Pictures taken. Just asked that they split them up. All needs met, call bell in reach.

## 2022-03-19 NOTE — Plan of Care (Signed)
  Problem: RH BLADDER ELIMINATION Goal: RH STG MANAGE BLADDER WITH ASSISTANCE Description: STG Manage Bladder With min Assistance Outcome: Not Applicable; oliguric   Problem: RH SKIN INTEGRITY Goal: RH STG SKIN FREE OF INFECTION/BREAKDOWN Description: Skin will be free of any infection/breakdown with min assist Outcome: Progressing; right aka clean, dry ,intact   Problem: RH PAIN MANAGEMENT Goal: RH STG PAIN MANAGED AT OR BELOW PT'S PAIN GOAL Description: Pain will be managed at 4 out of 10 on pain scale with PRN medications min assist Outcome: Progressing; prn meds

## 2022-03-19 NOTE — Progress Notes (Signed)
Patient refused the use of BIPAP for the evening. Patient stated that he does not wear one at home.

## 2022-03-19 NOTE — Progress Notes (Addendum)
[facility] IP rehab admissions - We have approval for acute inpatient rehab admission for today.  If MD feels patient is medically ready, then we can admit to CIR today.  Will await update from MD.  Call me for questions.  [phone]  We have attending MD clearance for CIR today.  Will admit to [facility].  Please have nurse call report to [phone].

## 2022-03-19 NOTE — Progress Notes (Signed)
Orthopedic Tech Progress Note Patient Details:  Adam Howell [birth year] [id number]   Order for 2 AK Shrinkers with education for the R side called into [place] clinic.  [name], male   DOB: 09-05-1960, 62 y.o.   MRN: [id number]  Carin Primrose 03/19/2022, 7:43 PM

## 2022-03-19 NOTE — Consult Note (Addendum)
[facility] Renal Consultation Note    Indication for Consultation:  Management of ESRD/hemodialysis; anemia, hypertension/volume and secondary hyperparathyroidism   HPI: Adam Howell is a 62 y.o. male with ESRD on HD MWF, DM, Afib, CHF. He was admitted to [facility] with a non healing RLE wound. He underwent R AKA on 03/13/22. He was discharged from [facility] today to start inpatient rehab at [facility]. He has been receiving dialysis without issue via his AVF. Last dialysis was 03/18/22.   Seen and examined at bedside. He has no concerns today. Denies fevers, chills, chest pain, dyspnea, nausea/vomiting. Has been on dialysis "For a long time". Can't remember the name of his usual kidney doctor.   Past Medical History:  Diagnosis Date   Chronic kidney disease    Congestive heart failure ([place])    Diabetes mellitus without complication ([place])    Difficult intubation    Hyperlipidemia    Hypertension    Past Surgical History:  Procedure Laterality Date   AMPUTATION Right 03/13/2022   Procedure: AMPUTATION ABOVE KNEE;  Surgeon: Algernon Huxley, MD;  Location: [facility] ORS;  Service: General;  Laterality: Right;   APPLICATION OF WOUND VAC Right 03/07/2022   Procedure: APPLICATION OF WOUND VAC;  Surgeon: Evaristo Bury, MD;  Location: [facility] ORS;  Service: Vascular;  Laterality: Right;  [place] [id number]   WOUND DEBRIDEMENT Left    WOUND DEBRIDEMENT Right 01/16/2022   Procedure: DEBRIDEMENT WOUND;  Surgeon: Katha Cabal, MD;  Location: [facility] ORS;  Service: Vascular;  Laterality: Right;  Wound vac placement   WOUND DEBRIDEMENT Right 03/07/2022   Procedure: DEBRIDEMENT WOUND RIGHT LOWER EXTREMITY;  Surgeon: Evaristo Bury, MD;  Location: [facility] ORS;  Service: Vascular;  Laterality: Right;   Family History  Problem Relation Age of Onset   Heart failure Mother    Cirrhosis Father    Hypertension Daughter    Social History:  reports that he has never smoked. He has never  used smokeless tobacco. He reports that he does not currently use alcohol. He reports that he does not use drugs. Allergies  Allergen Reactions   Tramadol Rash   Prior to Admission medications   Medication Sig Start Date End Date Taking? Authorizing Provider  amiodarone (PACERONE) 200 MG tablet Take 1 tablet (200 mg total) by mouth 2 (two) times daily. Patient not taking: Reported on 03/06/2022 04/10/21   [name] T, NP  bumetanide (BUMEX) 1 MG tablet Take 1 tablet (1 mg total) by mouth 2 (two) times daily. Patient not taking: Reported on 03/06/2022 04/10/21   [name] T, NP  ELIQUIS 2.5 MG TABS tablet Take 1 tablet (2.5 mg total) by mouth 2 (two) times daily. Sig: Daily as presciribed. Patient not taking: Reported on 03/06/2022 04/10/21   [name] T, NP  oxyCODONE-acetaminophen (PERCOCET/ROXICET) 5-325 MG tablet Take 1-2 tablets by mouth every 4 (four) hours as needed for moderate pain. 03/19/22   Wouk, Ailene Rud, MD  rosuvastatin (CRESTOR) 10 MG tablet Take 1 tablet (10 mg total) by mouth daily. 03/20/22   Wouk, Ailene Rud, MD  sevelamer carbonate (RENVELA) 800 MG tablet Take 2 tablets (1,600 mg total) by mouth 3 (three) times daily. 04/10/21   [name] T, NP   Current Facility-Administered Medications  Medication Dose Route Frequency Provider Last Rate Last Admin   acetaminophen (TYLENOL) tablet 325-650 mg  325-650 mg Oral Q4H PRN Love, Pamela S, PA-C       aluminum hydroxide (AMPHOJEL/ALTERNAGEL)  suspension 10 mL  10 mL Oral Q6H PRN Love, Pamela S, PA-C       amiodarone (PACERONE) tablet 200 mg  200 mg Oral BID Love, Pamela S, PA-C       apixaban (ELIQUIS) tablet 2.5 mg  2.5 mg Oral BID Love, Pamela S, PA-C       bisacodyl (DULCOLAX) suppository 10 mg  10 mg Rectal Daily PRN Love, Pamela S, PA-C       bumetanide (BUMEX) tablet 1 mg  1 mg Oral BID Love, Pamela S, PA-C       [START ON 03/20/2022] Chlorhexidine Gluconate Cloth 2 % PADS 6 each  6 each Topical Q0600  Bary Leriche, PA-C       [START ON 03/20/2022] Darbepoetin Alfa (ARANESP) injection 40 mcg  40 mcg Subcutaneous Q7 days Meredith Staggers, MD       dextrose 50 % solution 12.5 g  12.5 g Intravenous PRN Love, Pamela S, PA-C       diphenhydrAMINE (BENADRYL) 12.5 MG/5ML elixir 12.5-25 mg  12.5-25 mg Oral Q6H PRN Love, Pamela S, PA-C       guaiFENesin-dextromethorphan (ROBITUSSIN DM) 100-10 MG/5ML syrup 5-10 mL  5-10 mL Oral Q6H PRN Love, Pamela S, PA-C       heparin injection 1,000 Units  1,000 Units Intracatheter PRN Love, Pamela S, PA-C       hydrocerin (EUCERIN) cream   Topical BID Lovorn, Jinny Blossom, MD       insulin aspart (novoLOG) injection 0-6 Units  0-6 Units Subcutaneous TID WC Lovorn, Megan, MD       lidocaine-prilocaine (EMLA) cream 1 Application  1 Application Topical PRN Love, Pamela S, PA-C       melatonin tablet 5 mg  5 mg Oral QHS PRN Love, Pamela S, PA-C       milk and molasses enema  1 enema Rectal Daily PRN Love, Pamela S, PA-C       multivitamin (RENA-VIT) tablet 1 tablet  1 tablet Oral QHS Love, Pamela S, PA-C       oxyCODONE-acetaminophen (PERCOCET/ROXICET) 5-325 MG per tablet 1-2 tablet  1-2 tablet Oral Q4H PRN Bary Leriche, PA-C   2 tablet at 03/19/22 1522   pentafluoroprop-tetrafluoroeth (GEBAUERS) aerosol 1 Application  1 Application Topical PRN Love, Ivan Anchors, PA-C       polyethylene glycol (MIRALAX / GLYCOLAX) packet 17 g  17 g Oral Daily PRN Love, Pamela S, PA-C       prochlorperazine (COMPAZINE) tablet 5-10 mg  5-10 mg Oral Q6H PRN Love, Pamela S, PA-C       Or   prochlorperazine (COMPAZINE) injection 5-10 mg  5-10 mg Intramuscular Q6H PRN Love, Pamela S, PA-C       Or   prochlorperazine (COMPAZINE) suppository 12.5 mg  12.5 mg Rectal Q6H PRN Love, Pamela S, PA-C       [START ON 03/20/2022] rosuvastatin (CRESTOR) tablet 10 mg  10 mg Oral Daily Love, Pamela S, PA-C       simethicone (MYLICON) chewable tablet 80 mg  80 mg Oral QID PRN Love, Pamela S, PA-C         ROS: As  per HPI otherwise negative.  Physical Exam: Vitals:   03/19/22 1419 03/19/22 1419  BP: 130/69 130/69  Pulse: 76 76  Resp: 16 16  Temp: 98 F (36.7 C) 98 F (36.7 C)  TempSrc: Oral Oral  SpO2:  99%  Weight: 101.7 kg   Height: 6\' 3"  (1.905  m)      General: Appears comfortable, in no distress  Head: NCAT sclera not icteric MMM Neck: Supple. No JVD appreciated  Lungs: Clear bilaterally without wheezes, rales, or rhonchi. Normal WOB  Heart: RRR, no murmur, rub, or gallop  Abdomen: soft non-tender, bowel sounds normal, no masses  Lower extremities: trace LE edema.  R AKA  Neuro: A & O X 3. Moves all extremities spontaneously. Psych:  Responds to questions appropriately with a normal affect. Dialysis Access: LUE AVF +bruit   Labs: Basic Metabolic Panel: Recent Labs  Lab 03/15/22 0518 03/17/22 0542 03/18/22 1555 03/19/22 0429  NA 134* 133* 130* 134*  K 3.4* 3.6 3.7 3.7  CL 93* 93* 91* 95*  CO2 31 29 28 30   GLUCOSE 113* 113* 133* 96  BUN 21* 48* 37* 27*  CREATININE 4.88* 8.72* 7.12* 5.15*  CALCIUM 8.3* 8.1* 8.0* 8.0*  PHOS 3.9 5.0* 4.0  --    Liver Function Tests: Recent Labs  Lab 03/14/22 0849 03/18/22 1555  ALBUMIN 2.3* 2.3*   No results for input(s): "LIPASE", "AMYLASE" in the last 168 hours. No results for input(s): "AMMONIA" in the last 168 hours. CBC: Recent Labs  Lab 03/15/22 0518 03/17/22 0929 03/17/22 1206 03/18/22 1552 03/19/22 0429 03/19/22 1013  WBC 9.7 6.3  --  7.9 7.4 7.1  HGB 10.0* 6.6*   < > 8.1* 7.7* 8.4*  HCT 30.6* 20.8*   < > 25.1* 24.4* 26.8*  MCV 85.5 87.4  --  87.2 88.1 88.7  PLT 336 226  --  293 257 265   < > = values in this interval not displayed.   Cardiac Enzymes: No results for input(s): "CKTOTAL", "CKMB", "CKMBINDEX", "TROPONINI" in the last 168 hours. CBG: Recent Labs  Lab 03/17/22 2146 03/18/22 0732 03/18/22 1128 03/18/22 2216 03/19/22 0757  GLUCAP 142* 98 101* 98 129*   Iron Studies: No results for input(s):  "IRON", "TIBC", "TRANSFERRIN", "FERRITIN" in the last 72 hours. Studies/Results: No results found.  Dialysis Orders:  [place]. Followed by [facility]. Dr. Smith Mince. Will get orders in the am.   Assessment/Plan:  S/p R AKA 03/13/22 - Now in [facility] inpatient rehab.  ESRD -  HD MWF. Continue on schedule. Next HD 1/5  Hypertension/volume  - BP controlled. Volume stable. UF as able   Anemia  - Hb 7-8s s/p amputation. Continue ESA. Will check Fe studies.   Metabolic bone disease - Ca/Phos acceptable. No binders currently   Nutrition - Renal diet with fluid restriction   Lynnda Child PA-C [facility] 03/19/2022, 4:54 PM     Seen and examined independently.  Agree with note and exam as documented above by physician extender and as noted here.  Reviewed prior hospital course.  Patient with HTN, MD, ESRD, chronic systolic CHF presented to an outside hospital with myositis and non-healing wound.  Ultimately underwent amputation right AKA on 03/13/22.  Follows with [facility] group at [facility].  Transferred to [facility] for rehab. Spoke with the patient and his wife joined via speakerphone.   General adult male in bed in no acute distress HEENT normocephalic atraumatic extraocular movements intact sclera anicteric Neck supple trachea midline Lungs clear to auscultation bilaterally normal work of breathing at rest  Heart S1S2 no rub Abdomen soft nontender nondistended Extremities no edema  Psych normal mood and affect Neuro - alert and conversant provides history and follows commands Access LUE AVF bruit and thrill; area of skin discoloration - patient states now sticking  away from this   Right AKA - per rehab team   ESRD - on HD per MWF schedule. Getting outpatient orders   HTN - acceptable   Anemia CKD - getting ESA - continue.  Will increase dose to 100 mcg every Friday.   Metabolic bone disease - not on binders - phos acceptable. Getting outpatient orders    Claudia Desanctis, MD 03/19/2022 7:38 PM

## 2022-03-19 NOTE — Progress Notes (Signed)
INPATIENT REHABILITATION ADMISSION NOTE   Arrival Method: carelink      Mental Orientation:alert   Assessment:done   Skin:done with Deidre Ala RN   IV'S:none   Pain:8/10   Tubes and Drains:none   Safety Measures:done   Vital Signs:done   Height and Weight:done   Rehab Orientation:done   Family:none    Notes:Calogero Geisen Tour manager BC,BSN, WTA

## 2022-03-19 NOTE — Discharge Summary (Signed)
Avid Guillette NLZ:[id number] DOB: Aug 01, 1960 DOA: 03/05/2022  PCP: Venita Lick, NP  Admit date: 03/05/2022 Discharge date: 03/19/2022  Time spent: 35 minutes  Recommendations for Outpatient Follow-up:  F/u vascular surgery 3 weeks for staple removal and evaluation  Needs ace bandage reapplied to right AKA stump every day  Cardiology f/u    Discharge Diagnoses:  Active Problems:   Chronic systolic CHF (congestive heart failure) (HCC)   Type 2 diabetes mellitus with ESRD (end-stage renal disease) (HCC)   ESRD on dialysis (HCC)   Chronic atrial fibrillation with RVR (HCC)   Anemia in chronic kidney disease   Hyperlipidemia associated with type 2 diabetes mellitus (HCC)   Obesity (BMI 30-39.9)   Hypokalemia   Hyponatremia   Acute metabolic encephalopathy   Hypotension   Uncontrolled type 2 diabetes mellitus with hypoglycemia, with long-term current use of insulin ([place])   Discharge Condition: improved  Diet recommendation: low sodium heart healthy  Filed Weights   03/17/22 0558 03/18/22 0500 03/19/22 0500  Weight: 108.8 kg 106 kg 106.7 kg    History of present illness:  From admission h and p  Adam Howell is a 62 y.o. male with medical history significant for diabetes mellitus with complications of end-stage renal disease on hemodialysis (M/W/F), history of A-fib on Eliquis and amiodarone, HFpEF (EF 35 to 40% 2021), secondary hyperparathyroidism with chronic nonhealing right lower extremity wound for which he was hospitalized for debridement and IV antibiotics from 10/31 to 11/6 who was sent to the ED by his home health nurse due to concerns for increased drainage and foul odor from the wound in spite of ongoing dressings.  Patient also reports increased pain of the lower leg.  He denies fever and chills.  Wound culture from back in October 11 Proteus mirabilis and Klebsiella oxytoca treated initially with Rocephin and vancomycin and transition to Keflex at  discharge.   Hospital Course:  Patient presented with infection of right lower extremity. MRI showed myositis. I and D performed but infection progressed, patient taken for right above-the-knee amputation on 12/29 by Dr. Lucky Cowboy. Will need f/u with vascular surgery in 3 weeks for staple removal. Patient had acute toxic metabolic encephalopathy secondary to infection that has resolved. Patient was maintained on mwf hemodialysis. For the wound infection he was treated with vancomycin, then zosyn, and completed a course. Patient discharged to inpatient rehab. Other chronic conditions stable. Of note patient with hfref, stable here, had stopped all cardiology meds months prior to admission, will need outpt cardiology f/u.  Procedures: See above   Consultations: Vascular surgery, nephrology  Discharge Exam: Vitals:   03/18/22 1900 03/19/22 0755  BP: (!) 137/57 (!) 120/52  Pulse: 70 79  Resp: 20 20  Temp: 97.8 F (36.6 C) 98.2 F (36.8 C)  SpO2: (!) 75% (!) 88%    General exam: Appears calm and comfortable  Respiratory system: Clear to auscultation. Respiratory effort normal. Cardiovascular system: S1 & S2 heard, RRR. No JVD, murmurs, rubs, gallops or clicks. No pedal edema. Gastrointestinal system: Abdomen is nondistended, soft and nontender. No organomegaly or masses felt. Normal bowel sounds heard. Central nervous system: Alert and oriented. No focal neurological deficits. Extremities: Right AKA, dressed Skin: No rashes, lesions or ulcers Psychiatry: Judgement and insight appear normal. Mood & affect appropriate. '  Discharge Instructions   Discharge Instructions     Diet - low sodium heart healthy   Complete by: As directed    Discharge wound care:   Complete  by: As directed    Right stump: dress and wrap in ACE bandage daily   Increase activity slowly   Complete by: As directed       Allergies as of 03/19/2022       Reactions   Tramadol Rash        Medication List      STOP taking these medications    carvedilol 3.125 MG tablet Commonly known as: COREG   gabapentin 300 MG capsule Commonly known as: NEURONTIN   HYDROcodone-acetaminophen 10-325 MG tablet Commonly known as: NORCO   insulin aspart 100 UNIT/ML FlexPen Commonly known as: NOVOLOG   isosorbide mononitrate 30 MG 24 hr tablet Commonly known as: IMDUR   losartan 25 MG tablet Commonly known as: COZAAR   NIFEdipine 30 MG 24 hr tablet Commonly known as: PROCARDIA-XL/NIFEDICAL-XL   sertraline 50 MG tablet Commonly known as: ZOLOFT   tiZANidine 2 MG tablet Commonly known as: ZANAFLEX       TAKE these medications    amiodarone 200 MG tablet Commonly known as: PACERONE Take 1 tablet (200 mg total) by mouth 2 (two) times daily.   bumetanide 1 MG tablet Commonly known as: BUMEX Take 1 tablet (1 mg total) by mouth 2 (two) times daily.   Eliquis 2.5 MG Tabs tablet Generic drug: apixaban Take 1 tablet (2.5 mg total) by mouth 2 (two) times daily. Sig: Daily as presciribed.   oxyCODONE-acetaminophen 5-325 MG tablet Commonly known as: PERCOCET/ROXICET Take 1-2 tablets by mouth every 4 (four) hours as needed for moderate pain.   rosuvastatin 10 MG tablet Commonly known as: CRESTOR Take 1 tablet (10 mg total) by mouth daily. Start taking on: March 20, 2022 What changed:  medication strength how much to take   sevelamer carbonate 800 MG tablet Commonly known as: RENVELA Take 2 tablets (1,600 mg total) by mouth 3 (three) times daily.               Discharge Care Instructions  (From admission, onward)           Start     Ordered   03/19/22 0000  Discharge wound care:       Comments: Right stump: dress and wrap in ACE bandage daily   03/19/22 1109           Allergies  Allergen Reactions   Tramadol Rash      The results of significant diagnostics from this hospitalization (including imaging, microbiology, ancillary and laboratory) are listed below  for reference.    Significant Diagnostic Studies: EEG adult  Result Date: 04/07/2022 Lora Havens, MD     04/07/2022  9:36 PM Patient Name: Adam Howell MRN: [id number] Epilepsy Attending: Lora Havens Referring Physician/Provider: Kerney Elbe, MD Date: 03/10/2022 Duration: 30.50 mins Patient history: 62 year old male with altered mental status.  EEG to evaluate for seizure. Level of alertness: lethargic AEDs during EEG study: LEV Technical aspects: This EEG study was done with scalp electrodes positioned according to the 10-20 International system of electrode placement. Electrical activity was reviewed with band pass filter of 1-70Hz , sensitivity of 7 uV/mm, display speed of 55mm/sec with a 60Hz  notched filter applied as appropriate. EEG data were recorded continuously and digitally stored.  Video monitoring was available and reviewed as appropriate. Description: EEG showed continuous generalized 3 to 6 Hz theta-delta slowing. Hyperventilation and photic stimulation were not performed.   ABNORMALITY - Continuous slow, generalized IMPRESSION: This study is suggestive of moderate to severe diffuse encephalopathy, nonspecific  etiology. No seizures or epileptiform discharges were seen throughout the recording. Lora Havens   MR BRAIN WO CONTRAST  Result Date: 03/10/2022 CLINICAL DATA:  Neuro deficit with acute stroke suspected EXAM: MRI HEAD WITHOUT CONTRAST TECHNIQUE: Multiplanar, multiecho pulse sequences of the brain and surrounding structures were obtained without intravenous contrast. COMPARISON:  Head CT from earlier today FINDINGS: Brain: Punctate recent infarct in the right corona radiata and anterior frontal lobe, see markings on diffusion images. Small remote right cerebellar infarct. Small remote cortical infarcts scattered along the left frontal and parietal cortex. Rounded area of intense hemosiderin deposition in the right cerebellum measuring 16 mm, primarily low-density  on head CT and likely old hemorrhagic lacunar infarct rather than cavernoma. Chronic lacunar infarct at the left external capsule. Generalized cerebral volume loss. No hemorrhage, hydrocephalus, or collection. Vascular: Major flow voids are preserved Skull and upper cervical spine: No focal marrow lesion. Low marrow signal in the covered cervical spine, homogeneous and usually physiologic. Sinuses/Orbits: No acute finding IMPRESSION: 1. Two small acute or subacute infarcts in the right cerebral white matter. 2. Prior lacunar and left frontoparietal cortex infarcts. 3. Remote hemorrhagic insult in the right cerebellum Electronically Signed   By: Jorje Guild M.D.   On: 03/10/2022 15:16   EEG adult  Result Date: 03/10/2022 Lora Havens, MD     03/10/2022 12:25 PM Patient Name: Adam Howell MRN: [id number] Epilepsy Attending: Lora Havens Referring Physician/Provider: Kerney Elbe, MD Date: 03/10/2022 Duration: 25.57 mins Patient history: 62yo M with second episode of awake unresponsiveness. Subtle BUE intermittent jerking versus asterixis seen on exam.  EEG to evaluate for seizure. Level of alertness: lethargic AEDs during EEG study: None Technical aspects: This EEG study was done with scalp electrodes positioned according to the 10-20 International system of electrode placement. Electrical activity was reviewed with band pass filter of 1-70Hz , sensitivity of 7 uV/mm, display speed of 36mm/sec with a 60Hz  notched filter applied as appropriate. EEG data were recorded continuously and digitally stored.  Video monitoring was available and reviewed as appropriate. Description: EEG showed continuous generalized 3 to 5 Hz theta-delta slowing. Hyperventilation and photic stimulation were not performed.   ABNORMALITY - Continuous slow, generalized IMPRESSION: This study is suggestive of severe diffuse encephalopathy, nonspecific etiology. No seizures or epileptiform discharges were seen throughout the  recording. Priyanka [name]   CT HEAD CODE STROKE WO CONTRAST`  Result Date: 03/10/2022 CLINICAL DATA:  Code stroke. Neuro deficit, acute, stroke suspected. EXAM: CT HEAD WITHOUT CONTRAST TECHNIQUE: Contiguous axial images were obtained from the base of the skull through the vertex without intravenous contrast. RADIATION DOSE REDUCTION: This exam was performed according to the departmental dose-optimization program which includes automated exposure control, adjustment of the mA and/or kV according to patient size and/or use of iterative reconstruction technique. COMPARISON:  No pertinent prior exams available for comparison. FINDINGS: Brain: Mild generalized cerebral atrophy. Small age-indeterminate cortically-based infarcts within the posterior left frontal lobe and left parietal lobe (series 3, images 26 and 25). Small chronic cortically-based infarct within the left occipital lobe. Age-indeterminate lacunar infarct within the left corona radiata (series 3, image 19). 10 mm age-indeterminate infarct within the right cerebellar hemisphere. There is no acute intracranial hemorrhage. No extra-axial fluid collection. No evidence of an intracranial mass. No midline shift. Vascular: No hyperdense vessel. Atherosclerotic calcifications. Skull: No fracture or aggressive osseous lesion. Sinuses/Orbits: No mass or acute finding within the imaged orbits. Trace mucosal thickening scattered within the bilateral ethmoid air  cells. ASPECTS ([place] Stroke Program Early CT Score) - Ganglionic level infarction (caudate, lentiform nuclei, internal capsule, insula, M1-M3 cortex): 7 - Supraganglionic infarction (M4-M6 cortex): 1 Total score (0-10 with 10 being normal): 9 (when discounting an age-indeterminate cortically based infarct within the posterior left frontal lobe). These results were communicated to Dr. Cheral Marker at 8:49 amon 12/26/2023by text page via the [facility] messaging system. IMPRESSION: 1. Age-indeterminate infarcts  within the cortical/subcortical left frontal and left parietal lobes, left corona radiata and right cerebellar hemisphere as described. ASPECTS is 8. 2. Small chronic cortically-based infarct within the left occipital lobe. 3. Mild cerebral atrophy. Electronically Signed   By: Kellie Simmering D.O.   On: 03/10/2022 08:50   MR TIBIA FIBULA RIGHT WO CONTRAST  Result Date: 03/05/2022 CLINICAL DATA:  Chronic right lower extremity ulcer. History of CKD on dialysis. Anemia of chronic disease and type 2 diabetes. CHF. EXAM: MRI OF LOWER RIGHT EXTREMITY WITHOUT CONTRAST TECHNIQUE: Multiplanar, multisequence MR imaging of the right tibia/fibula area of concern was performed. No intravenous contrast was administered. COMPARISON:  Radiograph dated January 02, 2022 FINDINGS: Bones/Joint/Cartilage Marrow signal is within normal limits. No evidence of osteomyelitis. No cortical erosion or periosteal reaction. No evidence of fracture or dislocation. Ligaments Interosseous ligament is intact. Muscles and Tendons Increased signal of the muscles of the anterior compartment with edema along the tendons suggesting myositis. No fluid collection or abscess. Soft tissues There is a deep skin wound about the medial aspect of the tibia without evidence of fluid collection or abscess. Generalized subcutaneous soft tissue edema about the lower extremity. No drainable fluid collection or abscess. IMPRESSION: 1. Deep skin wound about the medial aspect of the tibia without evidence of fluid collection or abscess. 2. Increased signal of the muscles of the anterior compartment with edema along the tendons suggesting myositis. No evidence of fluid collection or abscess. 3. Marrow signal is within normal limits without evidence of osteomyelitis. Electronically Signed   By: Keane Police D.O.   On: 03/05/2022 23:32    Microbiology: No results found for this or any previous visit (from the past 240 hour(s)).   Labs: Basic Metabolic  Panel: Recent Labs  Lab 03/14/22 0849 03/15/22 0518 03/17/22 0542 03/18/22 1555 03/19/22 0429  NA 134* 134* 133* 130* 134*  K 3.6 3.4* 3.6 3.7 3.7  CL 96* 93* 93* 91* 95*  CO2 27 31 29 28 30   GLUCOSE 143* 113* 113* 133* 96  BUN 25* 21* 48* 37* 27*  CREATININE 5.68* 4.88* 8.72* 7.12* 5.15*  CALCIUM 8.3* 8.3* 8.1* 8.0* 8.0*  MG  --  1.9 2.3  --   --   PHOS 4.8* 3.9 5.0* 4.0  --    Liver Function Tests: Recent Labs  Lab 03/14/22 0849 03/18/22 1555  ALBUMIN 2.3* 2.3*   No results for input(s): "LIPASE", "AMYLASE" in the last 168 hours. No results for input(s): "AMMONIA" in the last 168 hours. CBC: Recent Labs  Lab 03/15/22 0518 03/17/22 0929 03/17/22 1206 03/18/22 1552 03/19/22 0429 03/19/22 1013  WBC 9.7 6.3  --  7.9 7.4 7.1  HGB 10.0* 6.6* 8.8* 8.1* 7.7* 8.4*  HCT 30.6* 20.8* 27.7* 25.1* 24.4* 26.8*  MCV 85.5 87.4  --  87.2 88.1 88.7  PLT 336 226  --  293 257 265   Cardiac Enzymes: No results for input(s): "CKTOTAL", "CKMB", "CKMBINDEX", "TROPONINI" in the last 168 hours. BNP: BNP (last 3 results) No results for input(s): "BNP" in the last 8760 hours.  ProBNP (  last 3 results) No results for input(s): "PROBNP" in the last 8760 hours.  CBG: Recent Labs  Lab 03/17/22 2146 03/18/22 0732 03/18/22 1128 03/18/22 2216 03/19/22 0757  GLUCAP 142* 98 101* 98 129*       Signed:  Desma Maxim MD.  [facility] 03/19/2022, 11:09 AM

## 2022-03-19 NOTE — Progress Notes (Signed)
[facility], [place] 03/19/22  Subjective:   LOS: 14  Patient known to our practice from previous admissions.  Patient admitted for worsening right lower extremity ulcer.  Currently getting IV broad-spectrum antibiotics.   Patient underwent irrigation and debridement of right lower extremity and placement of wound VAC.  Patient seen resting in bed, completed breakfast tray at bedside No further dialysis reported Appetite appropriate Pain well-managed   Objective:  Vital signs in last 24 hours:  Temp:  [97.8 F (36.6 C)-99.1 F (37.3 C)] 98.6 F (37 C) (01/04 1202) Pulse Rate:  [61-167] 75 (01/04 1202) Resp:  [10-20] 18 (01/04 1202) BP: (98-139)/(48-120) 139/63 (01/04 1202) SpO2:  [75 %-100 %] 98 % (01/04 1202) Weight:  [106.7 kg] 106.7 kg (01/04 0500)  Weight change: 0.7 kg Filed Weights   03/17/22 0558 03/18/22 0500 03/19/22 0500  Weight: 108.8 kg 106 kg 106.7 kg    Intake/Output:    Intake/Output Summary (Last 24 hours) at 03/19/2022 1205 Last data filed at 03/18/2022 1900 Gross per 24 hour  Intake --  Output 1000 ml  Net -1000 ml      Physical Exam: General: NAD, laying in the bed  HEENT Moist oral mucous membranes  Pulm/lungs Normal breathing effort  CVS/Heart No rub  Abdomen:  Soft, nontender, nondistended  Extremities: Rt AKA, no edema  Neurologic: Alert and oriented  Skin: No acute rashes  Access: AV fistula       Basic Metabolic Panel:  Recent Labs  Lab 03/14/22 0849 03/15/22 0518 03/17/22 0542 03/18/22 1555 03/19/22 0429  NA 134* 134* 133* 130* 134*  K 3.6 3.4* 3.6 3.7 3.7  CL 96* 93* 93* 91* 95*  CO2 27 31 29 28 30   GLUCOSE 143* 113* 113* 133* 96  BUN 25* 21* 48* 37* 27*  CREATININE 5.68* 4.88* 8.72* 7.12* 5.15*  CALCIUM 8.3* 8.3* 8.1* 8.0* 8.0*  MG  --  1.9 2.3  --   --   PHOS 4.8* 3.9 5.0* 4.0  --       CBC: Recent Labs  Lab 03/15/22 0518 03/17/22 0929 03/17/22 1206 03/18/22 1552  03/19/22 0429 03/19/22 1013  WBC 9.7 6.3  --  7.9 7.4 7.1  HGB 10.0* 6.6* 8.8* 8.1* 7.7* 8.4*  HCT 30.6* 20.8* 27.7* 25.1* 24.4* 26.8*  MCV 85.5 87.4  --  87.2 88.1 88.7  PLT 336 226  --  293 257 265       Lab Results  Component Value Date   HEPBSAG NON REACTIVE 03/06/2022      Microbiology:  No results found for this or any previous visit (from the past 240 hour(s)).   Coagulation Studies: No results for input(s): "LABPROT", "INR" in the last 72 hours.   Urinalysis: No results for input(s): "COLORURINE", "LABSPEC", "PHURINE", "GLUCOSEU", "HGBUR", "BILIRUBINUR", "KETONESUR", "PROTEINUR", "UROBILINOGEN", "NITRITE", "LEUKOCYTESUR" in the last 72 hours.  Invalid input(s): "APPERANCEUR"    Imaging: No results found.   Medications:    anticoagulant sodium citrate      amiodarone  200 mg Oral BID   apixaban  2.5 mg Oral BID   bumetanide  1 mg Oral BID   Chlorhexidine Gluconate Cloth  6 each Topical Q0600   [START ON 03/20/2022] epoetin (EPOGEN/PROCRIT) injection  4,000 Units Intravenous Q M,W,F-HD   multivitamin  1 tablet Oral QHS   rosuvastatin  10 mg Oral Daily   acetaminophen **OR** acetaminophen, alteplase, anticoagulant sodium citrate, dextrose, heparin, lidocaine (PF), lidocaine-prilocaine, loperamide, melatonin, morphine injection, ondansetron **  OR** ondansetron (ZOFRAN) IV, oxyCODONE-acetaminophen, pentafluoroprop-tetrafluoroeth  Assessment/ Plan:  62 y.o. male with diabetes with complications, ESRD, atrial fibrillation requiring Eliquis, amiodarone, congestive heart failure, secondary hyperparathyroidism, chronic nonhealing right lower extremity wound was admitted on 03/05/2022 for  Active Problems:   Anemia in chronic kidney disease   ESRD on dialysis ([place])   Obesity (BMI 30-39.9)   Type 2 diabetes mellitus with ESRD (end-stage renal disease) (HCC)   Chronic atrial fibrillation with RVR (HCC)   Hyperlipidemia associated with type 2 diabetes mellitus  (HCC)   Chronic systolic CHF (congestive heart failure) (HCC)   Hypokalemia   Hyponatremia   Acute metabolic encephalopathy   Hypotension   Uncontrolled type 2 diabetes mellitus with hypoglycemia, with long-term current use of insulin ([place])  Unspecified open wound, right lower leg, initial encounter [S81.801A] Necrotizing fasciitis of lower leg ([place]) [M72.6]  #. ESRD [facility]/MWF/left aVF   Dialysis received yesterday, UF 1 L achieved.  Next treatment scheduled for Friday.  Patient has been accepted to CIR in [place], dialysis will continue inpatient at [facility].  #. Anemia of CKD  Lab Results  Component Value Date   HGB 8.4 (L) 03/19/2022   Hemoglobin 8.4.  Low-dose EPO with dialysis treatments.  #. Secondary hyperparathyroidism of renal origin N 25.81   No results found for: "PTH" Lab Results  Component Value Date   PHOS 4.0 03/18/2022   Calcium and phosphorus within acceptable range.  #.  Nonhealing right lower extremity ulcer with wound infection -Completed antibiotic therapy. -Irrigation and debridement with wound vac placement with undesirable results. Rt AKA completed on 03/13/22. Vascular continues to follow.  Pain controlled  # Acute metabolic encephalopathy.  MRI head suggestive of subacute infarct however neurology has disputed this finding.  EEG performed, suggestive of metabolic encephalopathy.  Resolved   LOS: [place] 1/4/202412:05 [place], [place]

## 2022-03-19 NOTE — H&P (Signed)
Physical Medicine and Rehabilitation Admission H&P    Chief Complaint  Patient presents with   Functional deficits due to AKA    HPI:  Adam Howell is a 62 year old R handed   male with history of HTN, T2DM with neuropathy, ESRD- on HD MWF, RLE wound with progressive decline and ulcer to bone with copious drainage- myositis, not osteomyelitis- non healing wound. He was admitted to [facility] on 03/06/22 and started on IV for due to concerns of osteo. He underwent I and D with attempts at limb salvage. Post procedure, he had issues with hypotension as well as hypoxemia.  He had transient AMS on 12/23 and recurrent episode on 12/26 with flaccid UE and was poorly responsive. Code stroke initiated and MRI brain done showing 2 small infarcts acute or subacute in right cerebral white matter.  Dr. Cheral Marker felt that twitching of UE most consistent with asterixis with exam consistent with acute encephalopathy. Severe hypotension could also present with AMS. Neurontin held, EEG done showing severe diffuse encephalopathy without seizures. Recs to keep SBP at least 120/80 and worsening of  MS with myoclonic twitching felt to be due to Uremic encephalopathy with CO2 narcosis due to multiple opioids rather than meningitis/encephalitis.      ID consulted for input due on encephalitis v/s encephalopathy and did not feel that clinical picture s/o meningitis but likely CO2 narcosis w/underlying sleep apnea compounded by lack of dialysis for 5 days, infected wound as well as gabapentin/ativan. Vascular recommended AKA and as mentation improved, patient declined amputation with comfort care recommended on 12/28.  After family discussion, patient was agreeable and underwent L-AKA on 12/29 by Dr. Lucky Cowboy. Post op mentation improving without focal deficits and HD ongoing.  Meropenum and Linezolid d/c 48  hrs after surgery per ID input.  PT/OT has been working with patient who continues to be limited by impaired mobility and  ADLs.    Pt reports LBM yesterday- had been having some diarrhea, but has resolved- got smeds to make it stop per pt.  Still oliguric, not anuric- forms >100cc/day- 1x/day.  No N/V Pain "OK"- right now 8/10- but has been ~ 9/10- where leg missing more than residual limb pain- feels like leg still there.   On VRE contact precautions.   Review of Systems  Constitutional:  Positive for malaise/fatigue. Negative for chills and weight loss.  HENT: Negative.    Eyes:  Negative for double vision, photophobia and pain.  Respiratory:  Negative for cough, sputum production and shortness of breath.   Cardiovascular:  Negative for chest pain and palpitations.  Gastrointestinal:  Negative for abdominal pain, constipation, nausea and vomiting.  Genitourinary:  Negative for dysuria and frequency.       Oliguric- forms urine ~ 1x/day- <100cc  Musculoskeletal:  Positive for joint pain. Negative for back pain, falls and neck pain.  Skin: Negative.   Neurological:  Positive for sensory change and weakness. Negative for dizziness, speech change and focal weakness.  Endo/Heme/Allergies: Negative.   Psychiatric/Behavioral:  Negative for depression, substance abuse and suicidal ideas. The patient is not nervous/anxious.   All other systems reviewed and are negative.    Past Medical History:  Diagnosis Date   Chronic kidney disease    Congestive heart failure ([place])    Diabetes mellitus without complication ([place])    Difficult intubation    Hyperlipidemia    Hypertension     Past Surgical History:  Procedure Laterality Date   AMPUTATION Right  03/13/2022   Procedure: AMPUTATION ABOVE KNEE;  Surgeon: Algernon Huxley, MD;  Location: [facility] ORS;  Service: General;  Laterality: Right;   APPLICATION OF WOUND VAC Right 03/07/2022   Procedure: APPLICATION OF WOUND VAC;  Surgeon: Evaristo Bury, MD;  Location: [facility] ORS;  Service: Vascular;  Laterality: Right;  [place] [id number]   WOUND DEBRIDEMENT Left    WOUND  DEBRIDEMENT Right 01/16/2022   Procedure: DEBRIDEMENT WOUND;  Surgeon: Katha Cabal, MD;  Location: [facility] ORS;  Service: Vascular;  Laterality: Right;  Wound vac placement   WOUND DEBRIDEMENT Right 03/07/2022   Procedure: DEBRIDEMENT WOUND RIGHT LOWER EXTREMITY;  Surgeon: Evaristo Bury, MD;  Location: [facility] ORS;  Service: Vascular;  Laterality: Right;    Family History  Problem Relation Age of Onset   Heart failure Mother    Cirrhosis Father    Hypertension Daughter     Social History:  reports that he has never smoked. He has never used smokeless tobacco. He reports that he does not currently use alcohol. He reports that he does not use drugs.   Allergies  Allergen Reactions   Tramadol Rash    Medications Prior to Admission  Medication Sig Dispense Refill   HYDROcodone-acetaminophen (NORCO) 10-325 MG tablet Take 1 tablet by mouth every 8 (eight) hours as needed. Do not take more than is ordered and bring pill bottle to next provider visit. 90 tablet 0   insulin aspart (NOVOLOG) 100 UNIT/ML FlexPen Inject 3 Units into the skin 3 (three) times daily with meals. As needed only if blood sugar greater then 130 prior to meal. 15 mL 2   losartan (COZAAR) 25 MG tablet Take 1 tablet (25 mg total) by mouth daily. 30 tablet 0   rosuvastatin (CRESTOR) 40 MG tablet Take 1 tablet (40 mg total) by mouth daily. 90 tablet 4   sevelamer carbonate (RENVELA) 800 MG tablet Take 2 tablets (1,600 mg total) by mouth 3 (three) times daily. 360 tablet 4   tiZANidine (ZANAFLEX) 2 MG tablet TAKE 1 TABLET BY MOUTH ONCE DAILY AS NEEDED FOR MUSCLE SPASM 30 tablet 4   amiodarone (PACERONE) 200 MG tablet Take 1 tablet (200 mg total) by mouth 2 (two) times daily. (Patient not taking: Reported on 03/06/2022) 180 tablet 4   bumetanide (BUMEX) 1 MG tablet Take 1 tablet (1 mg total) by mouth 2 (two) times daily. (Patient not taking: Reported on 03/06/2022) 90 tablet 4   carvedilol (COREG) 3.125 MG tablet Take 1  tablet (3.125 mg total) by mouth 2 (two) times daily. (Patient not taking: Reported on 03/06/2022) 180 tablet 4   ELIQUIS 2.5 MG TABS tablet Take 1 tablet (2.5 mg total) by mouth 2 (two) times daily. Sig: Daily as presciribed. (Patient not taking: Reported on 03/06/2022) 180 tablet 4   gabapentin (NEURONTIN) 300 MG capsule Take 1 capsule (300 mg total) by mouth 2 (two) times daily. (Patient not taking: Reported on 03/06/2022) 180 capsule 4   isosorbide mononitrate (IMDUR) 30 MG 24 hr tablet Take 1 tablet (30 mg total) by mouth daily. (Patient not taking: Reported on 03/06/2022) 90 tablet 4   NIFEdipine (PROCARDIA-XL/NIFEDICAL-XL) 30 MG 24 hr tablet Take 1 tablet (30 mg total) by mouth daily. (Patient not taking: Reported on 03/06/2022) 90 tablet 4   sertraline (ZOLOFT) 50 MG tablet Take 50 mg by mouth daily. (Patient not taking: Reported on 03/06/2022)        Home: Home Living Family/patient expects to be discharged to:: Private residence  Living Arrangements: Other relatives (daughter) Available Help at Discharge: Family, Available 24 hours/day Type of Home: Apartment Home Access: Level entry Home Layout: One level Bathroom Shower/Tub: Chiropodist: Handicapped height Home Equipment: Conservation officer, nature (2 wheels) Additional Comments: Pt reports he lives with daughter who does not work   Functional History: Prior Function Prior Level of Function : Independent/Modified Independent Mobility Comments: Mod Ind amb with a RW community distances, no AD for household ambulation, no fall history ADLs Comments: generally MOD I-I in ADL, some assist for IADLs  Functional Status:  Mobility: Bed Mobility Overal bed mobility: Needs Assistance Bed Mobility: Supine to Sit Supine to sit: Mod assist Sit to supine: Mod assist, +2 for physical assistance General bed mobility comments: NT in recliner pre/post session Transfers Overall transfer level: Needs assistance Equipment used:  Rolling walker (2 wheels) Transfers: Sit to/from Stand Sit to Stand: Max assist (from chair) Bed to/from chair/wheelchair/BSC transfer type:: Lateral/scoot transfer  Lateral/Scoot Transfers: Min assist General transfer comment: Mod assist to stand from elevated bed height. Vcs for technique and sequencing. author protected L knee to prevent buckling however none observed. [name] Ambulation/Gait Ambulation/Gait assistance: Mod assist Gait Distance (Feet): 3 Feet Assistive device: Rolling walker (2 wheels) Gait Pattern/deviations:  (" hop to") General Gait Details: [name] but fatigued quickly resulting in need to pivot the rest of the way to recliner. Gait velocity: decreased    ADL: ADL Overall ADL's : Needs assistance/impaired Grooming: Wash/dry hands, Wash/dry face, Sitting, Set up Upper Body Dressing : With caregiver independent assisting Upper Body Dressing Details (indicate cue type and reason): gown seated in chair Lower Body Dressing: Moderate assistance Lower Body Dressing Details (indicate cue type and reason): pt doff sock with MIN A, donn with MOD A Toileting- Clothing Manipulation and Hygiene: Maximal assistance General ADL Comments: STS 5x with MAX A to approx 1/2 full upright standing with RW; changed socked, additional 3 attempts with pt able to stand up to 3/4 full upright standing with RW  Cognition: Cognition Overall Cognitive Status: No family/caregiver present to determine baseline cognitive functioning Orientation Level: Oriented X4 Cognition Arousal/Alertness: Awake/alert Behavior During Therapy: Flat affect Overall Cognitive Status: No family/caregiver present to determine baseline cognitive functioning Area of Impairment: Orientation, Following commands, Safety/judgement, Awareness, Problem solving Orientation Level: Disoriented to, Time Following Commands: Follows one step commands consistently, Follows  one step commands with increased time Safety/Judgement: Decreased awareness of deficits Awareness: Emergent Problem Solving: Slow processing, Decreased initiation, Requires verbal cues, Requires tactile cues General Comments: [name] or speak voluntarorly. Was cooperative but Pryor Curia questions if he fully understand current scope of situation.   Blood pressure (!) 120/52, pulse 79, temperature 98.2 F (36.8 C), temperature source Oral, resp. rate 20, height 6\' 3"  (1.905 m), weight 106.7 kg, SpO2 (!) 88 %. Physical Exam Vitals and nursing note reviewed. Exam conducted with a chaperone present.  Constitutional:      Comments: Awake, alert, appropriate, laying supine in bed; nursing at bedside; appears younger than stated age, on phone intermittently with wife in [place], NAD  HENT:     Head: Normocephalic and atraumatic.     Comments: No facial droop    Right Ear: External ear normal.     Left Ear: External ear normal.     Nose: Nose normal. No congestion.     Mouth/Throat:  Mouth: Mucous membranes are moist.     Pharynx: Oropharynx is clear. No oropharyngeal exudate.  Eyes:     General:        Right eye: No discharge.        Left eye: No discharge.     Extraocular Movements: Extraocular movements intact.  Cardiovascular:     Rate and Rhythm: Normal rate. Rhythm irregular.     Heart sounds: Normal heart sounds. No murmur heard.    No gallop.  Pulmonary:     Effort: Pulmonary effort is normal. No respiratory distress.     Breath sounds: Normal breath sounds. No wheezing, rhonchi or rales.  Abdominal:     General: There is no distension.     Palpations: Abdomen is soft.     Tenderness: There is no abdominal tenderness.     Comments: Slightly hypoactive  Musculoskeletal:        General: Normal range of motion.     Cervical back: Neck supple. No tenderness.     Comments: UE strength  5/5 B/L RLE HF 3+/5,  otherwise has R AKA LLE- 5/5 in HF, KE, DF and PF R AKA  Skin:    General: Skin is warm and dry.     Comments: Incision looks well- no drainage- dog ears intact; staples intact, no warmth of redness Fistula L forearm (+) has thrill Extremely dry skin on LLE/foot-scaly  Neurological:     Mental Status: He is oriented to person, place, and time.     Comments: Intact to light touch per pt in B/L LE's   Psychiatric:        Mood and Affect: Mood normal.        Behavior: Behavior normal.   Took picture, but doesn't show up in chart, of note!-   Results for orders placed or performed during the hospital encounter of 03/05/22 (from the past 48 hour(s))  Hemoglobin and hematocrit, blood     Status: Abnormal   Collection Time: 03/17/22 12:06 PM  Result Value Ref Range   Hemoglobin 8.8 (L) 13.0 - 17.0 g/dL    Comment: REPEATED TO VERIFY RESULTS VERIFIED VIA RECOLLECT    HCT 27.7 (L) 39.0 - 52.0 %    Comment: Performed at [facility], [address] [place], [place] [address]  Glucose, capillary     Status: Abnormal   Collection Time: 03/17/22  4:35 PM  Result Value Ref Range   Glucose-Capillary 138 (H) 70 - 99 mg/dL    Comment: Glucose reference range applies only to samples taken after fasting for at least 8 hours.  Glucose, capillary     Status: Abnormal   Collection Time: 03/17/22  9:46 PM  Result Value Ref Range   Glucose-Capillary 142 (H) 70 - 99 mg/dL    Comment: Glucose reference range applies only to samples taken after fasting for at least 8 hours.  Glucose, capillary     Status: None   Collection Time: 03/18/22  7:32 AM  Result Value Ref Range   Glucose-Capillary 98 70 - 99 mg/dL    Comment: Glucose reference range applies only to samples taken after fasting for at least 8 hours.   Comment 1 Notify RN    Comment 2 Document in Chart   Glucose, capillary     Status: Abnormal   Collection Time: 03/18/22 11:28 AM  Result Value Ref Range   Glucose-Capillary 101  (H) 70 - 99 mg/dL    Comment: Glucose reference range applies only  to samples taken after fasting for at least 8 hours.   Comment 1 Notify RN    Comment 2 Document in Chart   CBC     Status: Abnormal   Collection Time: 03/18/22  3:52 PM  Result Value Ref Range   WBC 7.9 4.0 - 10.5 K/uL   RBC 2.88 (L) 4.22 - 5.81 MIL/uL   Hemoglobin 8.1 (L) 13.0 - 17.0 g/dL   HCT 25.1 (L) 39.0 - 52.0 %   MCV 87.2 80.0 - 100.0 fL   MCH 28.1 26.0 - 34.0 pg   MCHC 32.3 30.0 - 36.0 g/dL   RDW 16.8 (H) 11.5 - 15.5 %   Platelets 293 150 - 400 K/uL   nRBC 0.0 0.0 - 0.2 %    Comment: Performed at [facility], [address] [place], [place] [address]  Renal function panel     Status: Abnormal   Collection Time: 03/18/22  3:55 PM  Result Value Ref Range   Sodium 130 (L) 135 - 145 mmol/L   Potassium 3.7 3.5 - 5.1 mmol/L   Chloride 91 (L) 98 - 111 mmol/L   CO2 28 22 - 32 mmol/L   Glucose, Bld 133 (H) 70 - 99 mg/dL    Comment: Glucose reference range applies only to samples taken after fasting for at least 8 hours.   BUN 37 (H) 6 - 20 mg/dL   Creatinine, Ser 7.12 (H) 0.61 - 1.24 mg/dL   Calcium 8.0 (L) 8.9 - 10.3 mg/dL   Phosphorus 4.0 2.5 - 4.6 mg/dL   Albumin 2.3 (L) 3.5 - 5.0 g/dL   GFR, Estimated 8 (L) >60 mL/min    Comment: (NOTE) Calculated using the CKD-EPI Creatinine Equation (2021)    Anion gap 11 5 - 15    Comment: Performed at [facility], [address] [place], [place] [address]  Glucose, capillary     Status: None   Collection Time: 03/18/22 10:16 PM  Result Value Ref Range   Glucose-Capillary 98 70 - 99 mg/dL    Comment: Glucose reference range applies only to samples taken after fasting for at least 8 hours.  CBC     Status: Abnormal   Collection Time: 03/19/22  4:29 AM  Result Value Ref Range   WBC 7.4 4.0 - 10.5 K/uL   RBC 2.77 (L) 4.22 - 5.81 MIL/uL   Hemoglobin 7.7 (L) 13.0 - 17.0 g/dL   HCT 24.4 (L) 39.0 - 52.0 %   MCV 88.1 80.0 - 100.0 fL   MCH 27.8  26.0 - 34.0 pg   MCHC 31.6 30.0 - 36.0 g/dL   RDW 16.6 (H) 11.5 - 15.5 %   Platelets 257 150 - 400 K/uL   nRBC 0.0 0.0 - 0.2 %    Comment: Performed at [facility], [address] [place], [place] [address]  Basic metabolic panel     Status: Abnormal   Collection Time: 03/19/22  4:29 AM  Result Value Ref Range   Sodium 134 (L) 135 - 145 mmol/L   Potassium 3.7 3.5 - 5.1 mmol/L   Chloride 95 (L) 98 - 111 mmol/L   CO2 30 22 - 32 mmol/L   Glucose, Bld 96 70 - 99 mg/dL    Comment: Glucose reference range applies only to samples taken after fasting for at least 8 hours.   BUN 27 (H) 6 - 20 mg/dL   Creatinine, Ser 5.15 (H) 0.61 - 1.24 mg/dL   Calcium 8.0 (  L) 8.9 - 10.3 mg/dL   GFR, Estimated 12 (L) >60 mL/min    Comment: (NOTE) Calculated using the CKD-EPI Creatinine Equation (2021)    Anion gap 9 5 - 15    Comment: Performed at [facility], [address] [place], [place] [address]  Glucose, capillary     Status: Abnormal   Collection Time: 03/19/22  7:57 AM  Result Value Ref Range   Glucose-Capillary 129 (H) 70 - 99 mg/dL    Comment: Glucose reference range applies only to samples taken after fasting for at least 8 hours.   Comment 1 Notify RN    Comment 2 Document in Chart   CBC     Status: Abnormal   Collection Time: 03/19/22 10:13 AM  Result Value Ref Range   WBC 7.1 4.0 - 10.5 K/uL   RBC 3.02 (L) 4.22 - 5.81 MIL/uL   Hemoglobin 8.4 (L) 13.0 - 17.0 g/dL   HCT 26.8 (L) 39.0 - 52.0 %   MCV 88.7 80.0 - 100.0 fL   MCH 27.8 26.0 - 34.0 pg   MCHC 31.3 30.0 - 36.0 g/dL   RDW 16.9 (H) 11.5 - 15.5 %   Platelets 265 150 - 400 K/uL   nRBC 0.0 0.0 - 0.2 %    Comment: Performed at [facility], [address], [place], [place] [address]   No results found.    Blood pressure (!) 120/52, pulse 79, temperature 98.2 F (36.8 C), temperature source Oral, resp. rate 20, height 6\' 3"  (1.905 m), weight 106.7 kg, SpO2 (!) 88 %.  Medical Problem List and  Plan: 1. Functional deficits secondary to R AKA in setting of non healing wound on RLE  -patient may  shower if covers R AKA incision  -ELOS/Goals: 18-21 days min A to supervision at w/c level- pt advised will likely not be walking when leaves, esp due to ESRD and reduced EF 2.  Antithrombotics: -DVT/anticoagulation:  Pharmaceutical: Eliquis  -antiplatelet therapy: N/A 3. Pain Management:  Fentanyl 25 mcg patch w/oxycodone prn.    --monitor for sedative SE.  4. Mood/Behavior/Sleep: LCSW to follow for evaluation and support.  --melatonin prn for insomnia.   -antipsychotic agents: N/A 5. Neuropsych/cognition: This patient is capable of making decisions on his own behalf. 6. Skin/Wound Care: Monitor wound for healing. R AKA- ordered shrinker- con't staples- healing well- ordered ACE wrap for now- Eucerin ordered for dry/scaly L foot 7. Fluids/Electrolytes/Nutrition: Strict I/O w/1200 cc FR/Day 8. T2DM: Hgb A1C-6.1 and diet controlled  --monitor BS ac/hs and use SSI for elevated BS. - ordered extra sensnitve SSI 9. ESRD: HD MWF at the end of the day to help with tolerance of activity  --daily weights w/FR and renal diet 10. OSA: continue BIPAP 11. Acute on chronic anemia of chronic disease: Continue to monitor H/H.   --H/H improved to 8.4 12. Chronic systolic CHF: Daily wt Y/7062 cc FR/day. Continue Bumex and Crestor.  --Fluid status managed with HD 13. Metabolic encephalopathy: Encourage CPAP use  --limit neuro sedating medications.  14. CAF: Monitor HR TID--continue amiodarone.  15. Diarrhea: Related to milk products and has resolved. 16. VRE contact precautions    I have personally performed a face to face diagnostic evaluation of this patient and formulated the key components of the plan.  Additionally, I have personally reviewed laboratory data, imaging studies, as well as relevant notes and concur with the physician assistant's documentation above.   The patient's status has not  changed from  the original H&P.  Any changes in documentation from the acute care chart have been noted above.         Bary Leriche, PA-C 03/19/2022

## 2022-03-19 NOTE — Progress Notes (Signed)
Called CIR and gave report to [facility] LPN. Who is awaiting his arrival. Both IV's removed and dressings applied. New dressing applied to amputee limb. All patient personal belongings packed and with patient. [facility] has arrived.

## 2022-03-19 NOTE — TOC Transition Note (Signed)
Transition of Care [facility]) - CM/SW Discharge Note   Patient Details  Name: Adam Howell MRN: [id number] Date of Birth: 18-Oct-1960  [facility] CM/SW Contact:  Beverly Sessions, RN Phone Number: 03/19/2022, 11:31 AM   Clinical Narrative:     Jodell Cipro  has arrange admit for CIR today [facility] forms printed to unit for bedside RN  Elvera Bicker dialysis liaison notified of discharge     Barriers to Discharge: Continued Medical Work up   Patient Goals and CMS Choice      Discharge Placement                         Discharge Plan and Services Additional resources added to the After Visit Summary for                                       Social Determinants of Health (SDOH) Interventions SDOH Screenings   Food Insecurity: Food Insecurity Present (03/06/2022)  Housing: Low Risk  (03/06/2022)  Transportation Needs: No Transportation Needs (03/06/2022)  Utilities: Not At Risk (03/06/2022)  Alcohol Screen: Low Risk  (11/25/2021)  Depression (PHQ2-9): Low Risk  (11/25/2021)  Financial Resource Strain: Low Risk  (11/25/2021)  Physical Activity: Insufficiently Active (11/25/2021)  Social Connections: Moderately Isolated (11/25/2021)  Stress: No Stress Concern Present (11/25/2021)  Tobacco Use: Low Risk  (03/14/2022)     Readmission Risk Interventions     No data to display

## 2022-03-19 NOTE — Progress Notes (Signed)
Inpatient Rehabilitation Care Coordinator Assessment and Plan Patient Details  Name: Parthiv Mucci MRN: [id number] Date of Birth: 1960/05/04  Today's Date: 03/19/2022  Hospital Problems: Principal Problem:   Above knee amputation of right lower extremity [facility])  Past Medical History:  Past Medical History:  Diagnosis Date   Chronic kidney disease    Congestive heart failure ([place])    Diabetes mellitus without complication ([place])    Difficult intubation    Hyperlipidemia    Hypertension    Past Surgical History:  Past Surgical History:  Procedure Laterality Date   AMPUTATION Right 03/13/2022   Procedure: AMPUTATION ABOVE KNEE;  Surgeon: Algernon Huxley, MD;  Location: [facility] ORS;  Service: General;  Laterality: Right;   APPLICATION OF WOUND VAC Right 03/07/2022   Procedure: APPLICATION OF WOUND VAC;  Surgeon: Evaristo Bury, MD;  Location: [facility] ORS;  Service: Vascular;  Laterality: Right;  GAAC [id number]   WOUND DEBRIDEMENT Left    WOUND DEBRIDEMENT Right 01/16/2022   Procedure: DEBRIDEMENT WOUND;  Surgeon: Katha Cabal, MD;  Location: [facility] ORS;  Service: Vascular;  Laterality: Right;  Wound vac placement   WOUND DEBRIDEMENT Right 03/07/2022   Procedure: DEBRIDEMENT WOUND RIGHT LOWER EXTREMITY;  Surgeon: Evaristo Bury, MD;  Location: [facility] ORS;  Service: Vascular;  Laterality: Right;   Social History:  reports that he has never smoked. He has never used smokeless tobacco. He reports that he does not currently use alcohol. He reports that he does not use drugs.  Family / Support Systems Marital Status: Married How Long?: 30 years Patient Roles: Spouse, Parent Spouse/Significant Other: Shahzain Kiester (wife; lives in [place]) Children: 4 children- Oley Balm (son), Jeneen Rinks (son), [name] (dtr- lives with dtr), and Runner, broadcasting/film/video (dtr). Other Supports: None reported Anticipated Caregiver: dtr [name] Ability/Limitations of Caregiver: Dtr reports pt will d/c to home with her, and she will  continue to provide assistance. Caregiver Availability: 24/7 Family Dynamics: Pt lives with his dtr and her family.  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 Health Literacy - How often do you need to have someone help you when you read instructions, pamphlets, or other written material from your doctor or pharmacy?: Never Writes: Yes Employment Status: Retired Public relations account executive Issues: Denies Guardian/Conservator: N/A   Abuse/Neglect Abuse/Neglect Assessment Can Be Completed: Yes Physical Abuse: Denies Verbal Abuse: Denies Sexual Abuse: Denies Exploitation of patient/patient's resources: Denies Self-Neglect: Denies  Patient response to: Social Isolation - How often do you feel lonely or isolated from those around you?: Never  Emotional Status Pt's affect, behavior and adjustment status: Pt in good spirits at time of visit Recent Psychosocial Issues: Denies Psychiatric History: Denies Substance Abuse History: Pt admits he is a former smoker and qquit 5+yrs ago. Denies any rec drug use or etoh use.  Patient / Family Perceptions, Expectations & Goals Pt/Family understanding of illness & functional limitations: Pt and family have a general understanding of pt care needs Premorbid pt/family roles/activities: Independent Anticipated changes in roles/activities/participation: Assistance with ADLs/IADLs Pt/family expectations/goals: pt goal is to walk. Admitting physician explained pt will d/c at w/c level and discussed once he has prosthesis to focus on walking.  Community Resources Express Scripts: None Premorbid Home Care/DME Agencies: None Transportation available at discharge: Yes- dtr [name] Is the patient able to respond to transportation needs?: Yes In the past 12 months, has lack of transportation kept you from medical appointments or from  getting  medications?: No In the past 12 months, has lack of transportation kept you from meetings, work, or from getting things needed for daily living?: No Resource referrals recommended: Neuropsychology  Discharge Planning Living Arrangements: Children Support Systems: Children, Spouse/significant other Type of Residence: Private residence Insurance Resources: Multimedia programmer (specify) ([employer] Medicare) Financial Resources: SSD Financial Screen Referred: No Living Expenses: Lives with family Money Management: Family Does the patient have any problems obtaining your medications?: No Home Management: Pt helps manage home care needs as well. Patient/Family Preliminary Plans: TBD Care Coordinator Barriers to Discharge: Decreased caregiver support, Lack of/limited family support, Insurance for SNF coverage, Hemodialysis Care Coordinator Anticipated Follow Up Needs: HH/OP  Clinical Impression SW met with pt dtr before she left. SW introduced self, explained role, and discussed discharge process. Confirms the plan remains for her father to return home with her. Pt is not a English as a second language teacher. [place] (wife/#[phone]). No DME. SW will confirm there are no barriers to discharge.   ESRD- MWF- [employer] .  Ceniyah Thorp A Kanetra Ho 03/19/2022, 2:10 PM

## 2022-03-20 DIAGNOSIS — E1169 Type 2 diabetes mellitus with other specified complication: Secondary | ICD-10-CM

## 2022-03-20 DIAGNOSIS — N186 End stage renal disease: Secondary | ICD-10-CM

## 2022-03-20 DIAGNOSIS — I739 Peripheral vascular disease, unspecified: Secondary | ICD-10-CM

## 2022-03-20 DIAGNOSIS — S78111A Complete traumatic amputation at level between right hip and knee, initial encounter: Secondary | ICD-10-CM | POA: Diagnosis not present

## 2022-03-20 DIAGNOSIS — Z992 Dependence on renal dialysis: Secondary | ICD-10-CM

## 2022-03-20 DIAGNOSIS — D649 Anemia, unspecified: Secondary | ICD-10-CM

## 2022-03-20 DIAGNOSIS — E669 Obesity, unspecified: Secondary | ICD-10-CM

## 2022-03-20 LAB — CBC
HCT: 23.6 % — ABNORMAL LOW (ref 39.0–52.0)
Hemoglobin: 7.8 g/dL — ABNORMAL LOW (ref 13.0–17.0)
MCH: 28.9 pg (ref 26.0–34.0)
MCHC: 33.1 g/dL (ref 30.0–36.0)
MCV: 87.4 fL (ref 80.0–100.0)
Platelets: 268 10*3/uL (ref 150–400)
RBC: 2.7 MIL/uL — ABNORMAL LOW (ref 4.22–5.81)
RDW: 17.1 % — ABNORMAL HIGH (ref 11.5–15.5)
WBC: 9.6 10*3/uL (ref 4.0–10.5)
nRBC: 0 % (ref 0.0–0.2)

## 2022-03-20 LAB — IRON AND TIBC
Iron: 34 ug/dL — ABNORMAL LOW (ref 45–182)
Saturation Ratios: 18 % (ref 17.9–39.5)
TIBC: 189 ug/dL — ABNORMAL LOW (ref 250–450)
UIBC: 155 ug/dL

## 2022-03-20 LAB — GLUCOSE, CAPILLARY
Glucose-Capillary: 92 mg/dL (ref 70–99)
Glucose-Capillary: 82 mg/dL (ref 70–99)
Glucose-Capillary: 89 mg/dL (ref 70–99)
Glucose-Capillary: 140 mg/dL — ABNORMAL HIGH (ref 70–99)

## 2022-03-20 LAB — RENAL FUNCTION PANEL
Albumin: 2.3 g/dL — ABNORMAL LOW (ref 3.5–5.0)
Anion gap: 11 (ref 5–15)
BUN: 37 mg/dL — ABNORMAL HIGH (ref 6–20)
CO2: 27 mmol/L (ref 22–32)
Calcium: 8.2 mg/dL — ABNORMAL LOW (ref 8.9–10.3)
Chloride: 94 mmol/L — ABNORMAL LOW (ref 98–111)
Creatinine, Ser: 7.85 mg/dL — ABNORMAL HIGH (ref 0.61–1.24)
GFR, Estimated: 7 mL/min — ABNORMAL LOW (ref >60)
Glucose, Bld: 139 mg/dL — ABNORMAL HIGH (ref 70–99)
Phosphorus: 4 mg/dL (ref 2.5–4.6)
Potassium: 4.1 mmol/L (ref 3.5–5.1)
Sodium: 132 mmol/L — ABNORMAL LOW (ref 135–145)

## 2022-03-20 LAB — HEPATITIS B SURFACE ANTIBODY, QUANTITATIVE: Hep B S AB Quant (Post): 387.4 m[IU]/mL (ref >9.9)

## 2022-03-20 LAB — FERRITIN: Ferritin: 198 ng/mL (ref 24–336)

## 2022-03-20 MED ORDER — HEPARIN SODIUM (PORCINE) 1000 UNIT/ML DIALYSIS: 20.0000 [IU]/kg | INTRAMUSCULAR | Status: DC | PRN

## 2022-03-20 MED ORDER — CINACALCET HCL 30 MG PO TABS
30.0000 mg | ORAL_TABLET | Freq: Every day | ORAL | Status: DC
  Administered 2022-03-21 – 2022-04-04 (×15): 30 mg via ORAL
  Filled 2022-03-20 (×15): qty 1

## 2022-03-20 MED ORDER — CALCITRIOL 0.5 MCG PO CAPS
1.5000 ug | ORAL_CAPSULE | ORAL | Status: DC
  Administered 2022-03-20 – 2022-04-03 (×5): 1.5 ug via ORAL
  Filled 2022-03-20 (×2): qty 6
  Filled 2022-03-20: qty 3
  Filled 2022-03-20: qty 6
  Filled 2022-03-20: qty 3
  Filled 2022-03-20: qty 6
  Filled 2022-03-20: qty 3

## 2022-03-20 MED ORDER — ANTICOAGULANT SODIUM CITRATE 4% (200MG/5ML) IV SOLN: 5.0000 mL | Status: DC | PRN

## 2022-03-20 MED ORDER — LIDOCAINE HCL (PF) 1 % IJ SOLN: 5.0000 mL | INTRAMUSCULAR | Status: DC | PRN

## 2022-03-20 MED ORDER — ALTEPLASE 2 MG IJ SOLR: 2.0000 mg | Freq: Once | INTRAMUSCULAR | Status: DC | PRN

## 2022-03-20 MED ORDER — ACETAMINOPHEN 325 MG PO TABS: 325.0000 mg | ORAL_TABLET | ORAL | Status: DC | PRN

## 2022-03-20 MED ORDER — SEVELAMER CARBONATE 800 MG PO TABS
1600.0000 mg | ORAL_TABLET | Freq: Three times a day (TID) | ORAL | Status: DC
  Administered 2022-03-20 – 2022-04-04 (×39): 1600 mg via ORAL
  Filled 2022-03-20 (×40): qty 2

## 2022-03-20 NOTE — Discharge Instructions (Addendum)
Inpatient Rehab Discharge Instructions  Adam Howell Discharge date and time:  04/03/2022  Activities/Precautions/ Functional Status: Activity: no lifting, driving, or strenuous exercise  till cleared by MD Diet: renal diet Wound Care: Keep amputation site clean and dry.  Buttock wound: Cleanse with soap and water, pat dry, apply layer of manuka honey and cover with dry dressing. Change daily.    Functional status:  ___ No restrictions     ___ Walk up steps independently __X_ 24/7 supervision/assistance   ___ Walk up steps with assistance ___ Intermittent supervision/assistance  ___ Bathe/dress independently ___ Walk with walker     _X__ Bathe/dress with assistance ___ Walk Independently    ___ Shower independently ___ Walk with assistance    ___ Shower with assistance _X__ No alcohol     ___ Return to work/school ________   Special Instructions: Need to increase protein intake --at least 2 supplements between meals. Also recommend taking Vitamin C, Zinc and multivitamin daily to help promote wound healing.    COMMUNITY REFERRALS UPON DISCHARGE:    All therapies are deferred until you get your prosthesis. Be sure to follow-up with your surgeon or the outpatient rehab clinic to discuss scheduling outpatient therapies once you have your prosthesis.   Medical Equipment/Items Ordered:hospital bed, wheelchair, drop arm bedside commode, and tub transfer bench                                                 Agency/Supplier:[facility] [phone]  GENERAL COMMUNITY RESOURCES FOR PATIENT/FAMILY: 1) Through your [facility] insurance you have transportation benefits. If transportation services are needed, be sure to contact [place] [phone] within 3-5 business days to scheduled transportation for medical appointments.   2) Remember you also have transportation assistance through [facility]. Please be sure to use this service if needed for transportation needs  as well.    My questions have been answered and I understand these instructions. I will adhere to these goals and the provided educational materials after my discharge from the hospital.  Patient/Caregiver Signature _______________________________ Date __________  Clinician Signature _______________________________________ Date __________  Please bring this form and your medication list with you to all your follow-up doctor's appointments.

## 2022-03-20 NOTE — Progress Notes (Signed)
Inpatient Rehabilitation  Patient information reviewed and entered into eRehab system by Liam Cammarata Antinio Sanderfer, OTR/L, Rehab Quality Coordinator.   Information including medical coding, functional ability and quality indicators will be reviewed and updated through discharge.   

## 2022-03-20 NOTE — Evaluation (Addendum)
Physical Therapy Assessment and Plan  Patient Details  Name: Adam Howell MRN: [id number] Date of Birth: 25-Jun-1960  PT Diagnosis: Abnormal posture, Difficulty walking, and Muscle weakness Rehab Potential: Good ELOS: 12-14 days.   Today's Date: 03/20/2022 PT Individual Time: 1045-1200 PT Individual Time Calculation (min): 75 min    Hospital Problem: Principal Problem:   Above knee amputation of right lower extremity ([place]) Active Problems:   ESRD on dialysis ([place])   Heart failure with reduced ejection fraction ([place])   Past Medical History:  Past Medical History:  Diagnosis Date   Chronic kidney disease    Congestive heart failure ([place])    Diabetes mellitus without complication ([place])    Difficult intubation    Hyperlipidemia    Hypertension    Past Surgical History:  Past Surgical History:  Procedure Laterality Date   AMPUTATION Right 03/13/2022   Procedure: AMPUTATION ABOVE KNEE;  Surgeon: Algernon Huxley, MD;  Location: [facility] ORS;  Service: General;  Laterality: Right;   APPLICATION OF WOUND VAC Right 03/07/2022   Procedure: APPLICATION OF WOUND VAC;  Surgeon: Evaristo Bury, MD;  Location: [facility] ORS;  Service: Vascular;  Laterality: Right;  GAAC [id number]   WOUND DEBRIDEMENT Left    WOUND DEBRIDEMENT Right 01/16/2022   Procedure: DEBRIDEMENT WOUND;  Surgeon: Katha Cabal, MD;  Location: [facility] ORS;  Service: Vascular;  Laterality: Right;  Wound vac placement   WOUND DEBRIDEMENT Right 03/07/2022   Procedure: DEBRIDEMENT WOUND RIGHT LOWER EXTREMITY;  Surgeon: Evaristo Bury, MD;  Location: [facility] ORS;  Service: Vascular;  Laterality: Right;    Assessment & Plan Clinical Impression: Adam Howell is a 62 year old R handed   male with history of HTN, T2DM with neuropathy, ESRD- on HD MWF, RLE wound with progressive decline and ulcer to bone with copious drainage- myositis, not osteomyelitis- non healing wound. He was admitted to [facility] on 03/06/22 and started on IV for due  to concerns of osteo. He underwent I and D with attempts at limb salvage. Post procedure, he had issues with hypotension as well as hypoxemia.  He had transient AMS on 12/23 and recurrent episode on 12/26 with flaccid UE and was poorly responsive. Code stroke initiated and MRI brain done showing 2 small infarcts acute or subacute in right cerebral white matter.  Dr. Cheral Marker felt that twitching of UE most consistent with asterixis with exam consistent with acute encephalopathy. Severe hypotension could also present with AMS. Neurontin held, EEG done showing severe diffuse encephalopathy without seizures. Recs to keep SBP at least 120/80 and worsening of  MS with myoclonic twitching felt to be due to Uremic encephalopathy with CO2 narcosis due to multiple opioids rather than meningitis/encephalitis.       ID consulted for input due on encephalitis v/s encephalopathy and did not feel that clinical picture s/o meningitis but likely CO2 narcosis w/underlying sleep apnea compounded by lack of dialysis for 5 days, infected wound as well as gabapentin/ativan. Vascular recommended AKA and as mentation improved, patient declined amputation with comfort care recommended on 12/28.  After family discussion, patient was agreeable and underwent L-AKA on 12/29 by Dr. Lucky Cowboy. Post op mentation improving without focal deficits and HD ongoing.  Meropenum and Linezolid d/c 48  hrs after surgery per ID input.  PT/OT has been working with patient who continues to be limited by impaired mobility and ADLs.  Patient currently requires max with mobility secondary to muscle weakness.  Prior to hospitalization, patient was modified  independent  with mobility and lived with Daughter in a [place] home.  Home access is  Level entry (pt describes 2-3" stoop.).  Patient will benefit from skilled PT intervention to maximize safe functional mobility, minimize fall risk, and decrease caregiver burden for planned discharge home with 24 hour  supervision.  Anticipate patient will benefit from follow up [facility] at discharge.  PT - End of Session Activity Tolerance: Tolerates 10 - 20 min activity with multiple rests Endurance Deficit: Yes PT Assessment Rehab Potential (ACUTE/IP ONLY): Good PT Barriers to Discharge: Home environment access/layout;Hemodialysis PT Patient demonstrates impairments in the following area(s): Balance;Safety;Endurance;Pain PT Transfers Functional Problem(s): Bed Mobility;Bed to Chair;Car;Furniture PT Locomotion Functional Problem(s): Ambulation;Wheelchair Mobility PT Plan PT Intensity: Minimum of 1-2 x/day ,45 to 90 minutes PT Frequency: 5 out of 7 days PT Duration Estimated Length of Stay: 12-14 days. PT Treatment/Interventions: Ambulation/gait training;Community reintegration;Stair training;UE/LE Strength taining/ROM;Wheelchair propulsion/positioning;Discharge planning;Therapeutic Activities;Functional mobility training;Patient/family education;Therapeutic Exercise PT Transfers Anticipated Outcome(s): supervision PT Locomotion Anticipated Outcome(s): w/c 150+ Mod I, amb w/ RW x 20' and CGA. PT Recommendation Follow Up Recommendations: Home health PT Patient destination: Home Equipment Recommended: To be determined;Wheelchair cushion (measurements);Wheelchair (measurements)   PT Evaluation Precautions/Restrictions Precautions Precautions: Fall Precaution Comments: s/p R AKA Restrictions Weight Bearing Restrictions: Yes RLE Weight Bearing: Non weight bearing General Chart Reviewed: Yes Family/Caregiver Present: No Vital Signs  Pain Pain Assessment Pain Scale: 0-10 (Simultaneous filing. User may not have seen previous data.) Pain Score: 8  (Simultaneous filing. User may not have seen previous data.) Pain Type: Surgical pain (Simultaneous filing. User may not have seen previous data.) Pain Location: Leg (Simultaneous filing. User may not have seen previous data.) Pain Orientation: Right  (Simultaneous filing. User may not have seen previous data.) Pain Descriptors / Indicators: Aching (Simultaneous filing. User may not have seen previous data.) Pain Frequency: Constant Pain Onset: On-going (Simultaneous filing. User may not have seen previous data.) Pain Intervention(s): Medication (See eMAR) Pain Interference Pain Interference Pain Effect on Sleep: 3. Frequently Pain Interference with Therapy Activities: 3. Frequently;2. Occasionally Pain Interference with Day-to-Day Activities: 3. Frequently;2. Occasionally Home Living/Prior Functioning Home Living Available Help at Discharge: Family;Available 24 hours/day Type of Home: Apartment Home Access: Level entry (pt describes 2-3" stoop.) Home Layout: One level  Lives With: Daughter Prior Function Level of Independence: Independent with transfers;Requires assistive device for independence (pt furniture crawls in home.)  Able to Take Stairs?: Yes Driving: Yes Vocation: On disability Vision/Perception     Cognition Overall Cognitive Status: No family/caregiver present to determine baseline cognitive functioning Arousal/Alertness: Awake/alert Orientation Level: Oriented X4 Safety/Judgment: Appears intact Sensation Sensation Light Touch: Appears Intact Coordination Gross Motor Movements are Fluid and Coordinated: Yes Fine Motor Movements are Fluid and Coordinated: Yes Heel Shin Test: N/A Motor  Motor Motor: Within Functional Limits   Trunk/Postural Assessment  Cervical Assessment Cervical Assessment:  (forward head) Thoracic Assessment Thoracic Assessment:  (rounded shoulders) Postural Control Righting Reactions: decreased.  Balance Balance Balance Assessed: Yes Static Standing Balance Static Standing - Balance Support: Bilateral upper extremity supported Static Standing - Level of Assistance: 4: Min assist Extremity Assessment      RLE Assessment General Strength Comments: R hip grossly 3+/5 LLE  Assessment LLE Assessment: Within Functional Limits General Strength Comments: grossly at least 4+/5  Care Tool Care Tool Bed Mobility Roll left and right activity   Roll left and right assist level: Contact Guard/Touching assist    Sit to lying activity   Sit to lying assist level:  Contact Guard/Touching assist    Lying to sitting on side of bed activity   Lying to sitting on side of bed assist level: the ability to move from lying on the back to sitting on the side of the bed with no back support.: Contact Guard/Touching assist     Care Tool Transfers Sit to stand transfer   Sit to stand assist level: Moderate Assistance - Patient 50 - 74%    Chair/bed transfer   Chair/bed transfer assist level: Maximal Assistance - Patient 25 - 49%     Physiological scientist transfer assist level: Moderate Assistance - Patient 50 - 74%      Care Tool Locomotion Ambulation   Assist level: Moderate Assistance - Patient 50 - 74% Assistive device: Walker-rolling Max distance: 8  Walk 10 feet activity Walk 10 feet activity did not occur: Safety/medical concerns       Walk 50 feet with 2 turns activity Walk 50 feet with 2 turns activity did not occur: Safety/medical concerns      Walk 150 feet activity Walk 150 feet activity did not occur: Safety/medical concerns      Walk 10 feet on uneven surfaces activity Walk 10 feet on uneven surfaces activity did not occur: Safety/medical concerns      Stairs Stair activity did not occur: Safety/medical concerns        Walk up/down 1 step activity Walk up/down 1 step or curb (drop down) activity did not occur: Safety/medical concerns      Walk up/down 4 steps activity Walk up/down 4 steps activity did not occur: Safety/medical concerns      Walk up/down 12 steps activity Walk up/down 12 steps activity did not occur: Safety/medical concerns      Pick up small objects from floor Pick up small object from the floor (from  standing position) activity did not occur: Safety/medical concerns      Wheelchair Is the patient using a wheelchair?: Yes Type of Wheelchair: Manual   Wheelchair assist level: Minimal Assistance - Patient > 75% Max wheelchair distance: 130  Wheel 50 feet with 2 turns activity   Assist Level: Minimal Assistance - Patient > 75%  Wheel 150 feet activity   Assist Level: Minimal Assistance - Patient > 75%    Refer to Care Plan for Long Term Goals  SHORT TERM GOAL WEEK 1 PT Short Term Goal 1 (Week 1): Pt will transfer squat pivot w/ mod to min A consistently PT Short Term Goal 2 (Week 1): Pt will transfer sit to stand w/ min A PT Short Term Goal 3 (Week 1): Pt will amb x 10' w/ min A and RW. PT Short Term Goal 4 (Week 1): Pt will negotiate w/c x 100' w/ supervision  Recommendations for other services: None   Skilled Therapeutic Intervention Evaluation completed (see details above and below) with education on PT POC and goals and individual treatment initiated with focus on  strengthening, endurance, transfers, pre-prosthetic training, w/c mobility, progress gait.  Pt presents sitting in w/c and agreeable to therapy.  Pt states eventually moving back to [facility], but aware of need to remain for follow-up and prosthesis.  Pt wheeled x 130' down hallway w/ supervision mostly but does require min A and cueing w/ list to left.  Educated on brake application, removal/placement of leg rest and arm rest and for improved propulsion.  Pt required mod A for sit to stand transfers  from w/c w/ cueing for sequencing, especially forward lean.  Mod A to simulated care at 23" height (for safety w/ 1st try) w/ RW.  Pt performed squat pivot w/ mod A w/c <> Nu-step.  Pt performed x 10' at Level 5., stating RPE only of 6  Pt returned to room and states increased pain to R limb and fatigue.  Pt performed squat pivot w/ max A and verbal cues for sequencing of R hand to side rail on 3rd attempt.  Pt transfers sit  <> supine w/ CGA.  Bed alarm on and all needs in reach.  Pt called nursing for pain meds, stating 9/10.   Mobility Bed Mobility Bed Mobility: Supine to Sit;Sit to Supine Supine to Sit: Contact Guard/Touching assist Sit to Supine: Contact Guard/Touching assist Transfers Transfers: Sit to Stand;Stand to Sit;Stand Pivot Transfers;Squat Pivot Transfers Sit to Stand: Moderate Assistance - Patient 50-74% Stand to Sit: Moderate Assistance - Patient 50-74% Stand Pivot Transfers: Moderate Assistance - Patient 50 - 74% Stand Pivot Transfer Details: Verbal cues for precautions/safety;Verbal cues for safe use of DME/AE;Verbal cues for sequencing Squat Pivot Transfers: Maximal Assistance - Patient 25-49% (increased fatigue at end of session, pt had performed squat pivot transfer w/ mod A w/c <> Nu-step earlier in session.) Locomotion  Gait Ambulation: Yes Gait Assistance: Moderate Assistance - Patient 50-74% Gait Distance (Feet): 8 Feet Assistive device: Rolling walker Gait Assistance Details: Verbal cues for technique;Verbal cues for precautions/safety Gait Assistance Details: verbal cues for posture. Gait Gait velocity: decreased Stairs / Additional Locomotion Stairs: No Architect: Yes Wheelchair Assistance: Minimal assistance - Patient >75% Environmental health practitioner: Both upper extremities Wheelchair Parts Management: Needs assistance Distance: 130   Discharge Criteria: Patient will be discharged from PT if patient refuses treatment 3 consecutive times without medical reason, if treatment goals not met, if there is a change in medical status, if patient makes no progress towards goals or if patient is discharged from hospital.  The above assessment, treatment plan, treatment alternatives and goals were discussed and mutually agreed upon: by patient  Ladoris Gene 03/20/2022, 12:36 PM

## 2022-03-20 NOTE — Progress Notes (Signed)
[facility] Progress Note  Name: Adam Howell MRN: [id number] DOB: 12-15-1960  Chief Complaint:  Transferred to CIR after right AKA (which took place 03/13/22)  Subjective:  First HD treatment here since transfer to CIR.  Seen and examined on dialysis.  Blood pressure 101/62 and HR 74 on HD. Procedure supervised.  Tolerating goal.  LUE AVF in use.    Review of systems:  Denies shortness of breath or chest pain  Denies n/v    Intake/Output Summary (Last 24 hours) at 03/20/2022 1354 Last data filed at 03/20/2022 0700 Gross per 24 hour  Intake 447 ml  Output --  Net 447 ml    Vitals:  Vitals:   03/20/22 0316 03/20/22 0500 03/20/22 0734 03/20/22 1323  BP: (!) 142/75  134/76 (!) 112/56  Pulse: 72  80 81  Resp: 18   20  Temp: 98.8 F (37.1 C)   98.5 F (36.9 C)  TempSrc: Oral   Oral  SpO2: 100%  100% 99%  Weight:  101.9 kg    Height:         Physical Exam:  General adult male in bed in no acute distress HEENT normocephalic atraumatic extraocular movements intact sclera anicteric Neck supple trachea midline Lungs clear to auscultation bilaterally normal work of breathing at rest  Heart S1S2 no rub Abdomen soft nontender nondistended Extremities no edema  Psych normal mood and affect Neuro - alert and conversant provides history and follows commands Access LUE AVF bruit and thrill; area of skin discoloration - patient states sticking away from this  Medications reviewed   Labs:     Latest Ref Rng & Units 03/19/2022    4:29 AM 03/18/2022    3:55 PM 03/17/2022    5:42 AM  BMP  Glucose 70 - 99 mg/dL 96  133  113   BUN 6 - 20 mg/dL 27  37  48   Creatinine 0.61 - 1.24 mg/dL 5.15  7.12  8.72   Sodium 135 - 145 mmol/L 134  130  133   Potassium 3.5 - 5.1 mmol/L 3.7  3.7  3.6   Chloride 98 - 111 mmol/L 95  91  93   CO2 22 - 32 mmol/L 30  28  29    Calcium 8.9 - 10.3 mg/dL 8.0  8.0  8.1    [place]. Followed by [facility]. Dr. Smith Mince.   Assessment/Plan:    Right AKA - per rehab team    ESRD - on HD per MWF schedule.  Getting outpatient HD orders.  See separate plan of care note to be entered today.  Appreciate PA.  Note that he will have a lower EDW s/p right AKA   HTN - acceptable    Anemia CKD - Increased aranesp dose to 100 mcg every Friday.    Metabolic bone disease - not on binders - phos acceptable. Getting outpatient orders as above  Disposition - in CIR for rehab    Claudia Desanctis, MD 03/20/2022 2:09 PM

## 2022-03-20 NOTE — Progress Notes (Signed)
Received patient in bed to unit.  Alert and oriented.  Informed consent signed and in chart.   Treatment initiated: 1330 Treatment completed: 1713  Patient tolerated well.  Transported back to the room  Alert, without acute distress.  Hand-off given to patient's nurse.   Access used: AVF   Access issues: none  Total UF removed: 2L Medication(s) given: none Post HD VS: 121/66,73,99%,18,98.1 Post HD weight: 103.5kg   [facility]

## 2022-03-20 NOTE — Progress Notes (Signed)
Occupational Therapy Assessment and Plan  Patient Details  Name: Adam Howell MRN: [id number] Date of Birth: 1960-05-26  OT Diagnosis: abnormal posture, acute pain, cognitive deficits, and muscle weakness (generalized) Rehab Potential: Rehab Potential (ACUTE ONLY): Good ELOS: 12-16   Today's Date: 03/20/2022 OT Individual Time: 6754-4920 OT Individual Time Calculation (min): 75 min     Hospital Problem: Principal Problem:   Above knee amputation of right lower extremity ([place]) Active Problems:   ESRD on dialysis ([place])   Heart failure with reduced ejection fraction ([place])   Past Medical History:  Past Medical History:  Diagnosis Date   Chronic kidney disease    Congestive heart failure ([place])    Diabetes mellitus without complication ([place])    Difficult intubation    Hyperlipidemia    Hypertension    Past Surgical History:  Past Surgical History:  Procedure Laterality Date   AMPUTATION Right 03/13/2022   Procedure: AMPUTATION ABOVE KNEE;  Surgeon: Algernon Huxley, MD;  Location: [facility] ORS;  Service: General;  Laterality: Right;   APPLICATION OF WOUND VAC Right 03/07/2022   Procedure: APPLICATION OF WOUND VAC;  Surgeon: Evaristo Bury, MD;  Location: [facility] ORS;  Service: Vascular;  Laterality: Right;  GAAC [id number]   WOUND DEBRIDEMENT Left    WOUND DEBRIDEMENT Right 01/16/2022   Procedure: DEBRIDEMENT WOUND;  Surgeon: Katha Cabal, MD;  Location: [facility] ORS;  Service: Vascular;  Laterality: Right;  Wound vac placement   WOUND DEBRIDEMENT Right 03/07/2022   Procedure: DEBRIDEMENT WOUND RIGHT LOWER EXTREMITY;  Surgeon: Evaristo Bury, MD;  Location: [facility] ORS;  Service: Vascular;  Laterality: Right;    Assessment & Plan Clinical Impression:  Pt is a 62 year old male presenting ED due to concerns for increased drainage and foul odor from his wound in spite of ongoing dressings; hospital course complicated by dense encephalopathy, code stroke called 12/26 with MRI impression  including "Two small acute or subacute infarcts in the right cerebral white matter"; Pt s/p R AKA 12/29. PMH significant for DM with complications of ESRD on hemodialysis (M/W/F), history of A-fib on Eliquis and amiodarone, HFpEF (EF 35 to 40% 2021), and secondary hyperparathyroidism   Patient currently requires mod with basic self-care skills secondary to muscle weakness, decreased cardiorespiratoy endurance, decreased safety awareness and decreased memory, and decreased sitting balance, decreased standing balance, decreased postural control, and decreased balance strategies.  Prior to hospitalization, patient could complete BADL/IADL with supervision.  Patient will benefit from skilled intervention to decrease level of assist with basic self-care skills and increase independence with basic self-care skills prior to discharge home with care partner.  Anticipate patient will require 24 hour supervision and follow up home health.  OT - End of Session Activity Tolerance: Tolerates 30+ min activity with multiple rests Endurance Deficit: Yes OT Assessment Rehab Potential (ACUTE ONLY): Good OT Barriers to Discharge: Hemodialysis OT Patient demonstrates impairments in the following area(s): Balance;Cognition;Endurance;Pain;Skin Integrity OT Basic ADL's Functional Problem(s): Grooming;Bathing;Dressing;Toileting OT Advanced ADL's Functional Problem(s): Simple Meal Preparation OT Transfers Functional Problem(s): Toilet;Tub/Shower OT Additional Impairment(s): Fuctional Use of Upper Extremity OT Plan OT Intensity: Minimum of 1-2 x/day, 45 to 90 minutes OT Frequency: 5 out of 7 days OT Duration/Estimated Length of Stay: 12-16 OT Treatment/Interventions: Balance/vestibular training;Discharge planning;Pain management;Self Care/advanced ADL retraining;Therapeutic Activities;UE/LE Coordination activities;Visual/perceptual remediation/compensation;Therapeutic Exercise;Skin care/wound managment;Patient/family  education;Functional mobility training;Disease mangement/prevention;Cognitive remediation/compensation;DME/adaptive equipment instruction;Community reintegration;Neuromuscular re-education;Psychosocial support;UE/LE Strength taining/ROM;Splinting/orthotics;Wheelchair propulsion/positioning OT Self Feeding Anticipated Outcome(s): S OT Basic Self-Care Anticipated Outcome(s): S OT Toileting Anticipated  Outcome(s): S OT Bathroom Transfers Anticipated Outcome(s): S OT Recommendation Recommendations for Other Services: Therapeutic Recreation consult Therapeutic Recreation Interventions: Stress management;Pet therapy Patient destination: Home Follow Up Recommendations: Home health OT Equipment Recommended: 3 in 1 bedside comode;Tub/shower bench;Other (comment) Equipment Details: [facility]   OT Evaluation Precautions/Restrictions  Precautions Precautions: Fall Precaution Comments: s/p R AKA Restrictions Weight Bearing Restrictions: Yes RLE Weight Bearing: Non weight bearing General Chart Reviewed: Yes Family/Caregiver Present: No Vital Signs Therapy Vitals Pulse Rate: 80 BP: 134/76 Patient Position (if appropriate): Lying Oxygen Therapy SpO2: 100 % O2 Device: Room Air Pain Pain Assessment Pain Scale: 0-10 Pain Score: 9  Pain Type: Surgical pain Pain Location: Leg Pain Orientation: Right Pain Descriptors / Indicators: Aching Pain Frequency: Constant Pain Onset: On-going Pain Intervention(s): Medication (See eMAR) Home Living/Prior Functioning Home Living Family/patient expects to be discharged to:: Private residence Living Arrangements: Children Available Help at Discharge: Family, Available 24 hours/day Type of Home: Apartment Home Access: Level entry Home Layout: One level Bathroom Shower/Tub: Tub/shower unit, Architectural technologist: Standard Additional Comments: Pt reports he lives with daughter who does not work IADL History Armed forces logistics/support/administrative officer: Yes Meal Prep  Responsibility: Building surveyor Responsibility: No Cleaning Responsibility: No Financial controller Responsibility: Primary Shopping Responsibility: Primary Current License: Yes Mode of Transportation: Car Prior Function Level of Independence: Independent with basic ADLs, Independent with homemaking with ambulation  Able to Take Stairs?: Yes Driving: Yes Vocation Requirements: fishing Vision Baseline Vision/History: 0 No visual deficits Ability to See in Adequate Light: 0 Adequate Patient Visual Report: No change from baseline Vision Assessment?: No apparent visual deficits Perception  Perception: Within Functional Limits Praxis Praxis: Intact Cognition Cognition Arousal/Alertness: Awake/alert Orientation Level: Person;Situation;Place Person: Oriented Place: Oriented Situation: Oriented Brief Interview for Mental Status (BIMS) Repetition of Three Words (First Attempt): 3 Temporal Orientation: Year: Missed by more than 5 years Temporal Orientation: Month: Accurate within 5 days Temporal Orientation: Day: Incorrect Recall: "Sock": Yes, after cueing ("something to wear") Recall: "Blue": Yes, no cue required Recall: "Bed": Yes, no cue required BIMS Summary Score: 10 Sensation Sensation Light Touch: Appears Intact Coordination Gross Motor Movements are Fluid and Coordinated: Yes Fine Motor Movements are Fluid and Coordinated: Yes Motor  Motor Motor: Abnormal postural alignment and control Motor - Skilled Clinical Observations: pain  guarding  Trunk/Postural Assessment  Cervical Assessment Cervical Assessment:  (head forwrad) Thoracic Assessment Thoracic Assessment:  (rounded shoulders) Lumbar Assessment Lumbar Assessment:  (post pelvic preference) Postural Control Postural Control: Deficits on evaluation Trunk Control: decreased- weakness and pain guarding Righting Reactions: insufficient standing  Balance   Extremity/Trunk Assessment RUE Assessment RUE  Assessment: Within Functional Limits LUE Assessment LUE Assessment: Within Functional Limits  Care Tool Care Tool Self Care Eating   Eating Assist Level: Set up assist    Oral Care    Oral Care Assist Level: Supervision/Verbal cueing    Bathing   Body parts bathed by patient: Right arm;Left arm;Chest;Abdomen;Right upper leg;Left upper leg;Left lower leg;Face;Front perineal area Body parts bathed by helper: Buttocks Body parts n/a: Right lower leg Assist Level: Minimal Assistance - Patient > 75%    Upper Body Dressing(including orthotics)   What is the patient wearing?: Pull over shirt   Assist Level: Set up assist    Lower Body Dressing (excluding footwear)   What is the patient wearing?: Pants Assist for lower body dressing: Moderate Assistance - Patient 50 - 74%    Putting on/Taking off footwear   What is the patient wearing?: Non-skid slipper socks Assist  for footwear: Total Assistance - Patient < 25%       Care Tool Toileting Toileting activity   Assist for toileting: Maximal Assistance - Patient 25 - 49%     Care Tool Bed Mobility Roll left and right activity        Sit to lying activity        Lying to sitting on side of bed activity         Care Tool Transfers Sit to stand transfer        Chair/bed transfer   Chair/bed transfer assist level: Moderate Assistance - Patient 50 - 74%     Toilet transfer   Assist Level: Moderate Assistance - Patient 50 - 74%     Care Tool Cognition  Expression of Ideas and Wants    Understanding Verbal and Non-Verbal Content     Memory/Recall Ability     Refer to Care Plan for Long Term Goals  SHORT TERM GOAL WEEK 1    Recommendations for other services: Therapeutic Recreation  Pet therapy and Stress management   Skilled Therapeutic Intervention ADL ADL Eating: Set up Where Assessed-Eating: Bed level Grooming: Supervision/safety Where Assessed-Grooming: Sitting at sink Upper Body Bathing: Setup Where  Assessed-Upper Body Bathing: Edge of bed Lower Body Bathing: Moderate assistance Where Assessed-Lower Body Bathing: Edge of bed Upper Body Dressing: Supervision/safety Where Assessed-Upper Body Dressing: Edge of bed Lower Body Dressing: Moderate assistance Where Assessed-Lower Body Dressing: Edge of bed Toileting: Maximal assistance Where Assessed-Toileting: Bedside Commode Toilet Transfer: Moderate assistance Toilet Transfer Method: Squat pivot Toilet Transfer Equipment: Drop arm bedside commode Tub/Shower Transfer: Unable to assess Mobility    MOD A squat pivot MOD-MAX A sit to stand in stedy   1:1. Pt educated on OT role/purpose, CIR, ELOS, and limb loss recovery. Pt finishing breakfast upon entering room. OT gathers w/c, DAC, and TTB for in bathroom. Pt agreeable to BADL at EOB. Overall pt slow moving & needs increased time to complete tasks. Pt completes BADL at EOB as stated above with edu re lateral leans/adaptive strategies. Pt unable to reach L foot to wash/footwear. Pt completes squat pivot transfer to/from EOB with MOD A overall. Exited session with pt seated in w/c, exit alarm on and call light in reach    Discharge Criteria: Patient will be discharged from OT if patient refuses treatment 3 consecutive times without medical reason, if treatment goals not met, if there is a change in medical status, if patient makes no progress towards goals or if patient is discharged from hospital.  The above assessment, treatment plan, treatment alternatives and goals were discussed and mutually agreed upon: by patient  [name] 03/20/2022, 8:18 AM

## 2022-03-20 NOTE — Plan of Care (Signed)
Outpatient Dialysis Orders  [place] - Followed by [facility] Dr. Smith Mince MWF 4.5h EDW 113.6kg 450/800 2K.2.5Ca  AVF 14 g x2 Mircera  100 q 4 weeks Sensipar 60 TIW Calcitriol 1.5 TIW  -Have resumed bone mineral meds  -ESA dosing increase  -Now well below outpatient dry weight  Lynnda Child PA-C [facility] 03/20/2022,2:13 PM

## 2022-03-20 NOTE — Progress Notes (Signed)
PROGRESS NOTE   Subjective/Complaints: Pain fairly controlled. ACE wrap easily comes loose from leg. Pain control ok  ROS: Patient denies fever, rash, sore throat, blurred vision, dizziness, nausea, vomiting, diarrhea, cough, shortness of breath or chest pain,   headache, or mood change.    Objective:   No results found. Recent Labs    03/19/22 0429 03/19/22 1013  WBC 7.4 7.1  HGB 7.7* 8.4*  HCT 24.4* 26.8*  PLT 257 265   Recent Labs    03/18/22 1555 03/19/22 0429  NA 130* 134*  K 3.7 3.7  CL 91* 95*  CO2 28 30  GLUCOSE 133* 96  BUN 37* 27*  CREATININE 7.12* 5.15*  CALCIUM 8.0* 8.0*    Intake/Output Summary (Last 24 hours) at 03/20/2022 0909 Last data filed at 03/20/2022 6256 Gross per 24 hour  Intake 120 ml  Output --  Net 120 ml        Physical Exam: Vital Signs Blood pressure 134/76, pulse 80, temperature 98.8 F (37.1 C), temperature source Oral, resp. rate 18, height 6\' 3"  (1.905 m), weight 101.9 kg, SpO2 100 %.  General: Alert and oriented x 3, No apparent distress HEENT: Head is normocephalic, atraumatic, PERRLA, EOMI, sclera anicteric, oral mucosa pink and moist, dentition intact, ext ear canals clear,  Neck: Supple without JVD or lymphadenopathy Heart: Reg rate and rhythm. No murmurs rubs or gallops Chest: CTA bilaterally without wheezes, rales, or rhonchi; no distress Abdomen: Soft, non-tender, non-distended, bowel sounds positive. Extremities: No clubbing, cyanosis. Pulses are 2+ Psych: Pt's affect is appropriate. Pt is cooperative Skin: AVF LUE, right aka wound intact with min drainage Neuro:  Alert and oriented x 3. Normal insight and awareness. Intact Memory. Normal language and speech. Cranial nerve exam unremarkable  Musculoskeletal: right residual limb remains swollen and tender    Assessment/Plan: 1. Functional deficits which require 3+ hours per day of interdisciplinary therapy in a  comprehensive inpatient rehab setting. Physiatrist is providing close team supervision and 24 hour management of active medical problems listed below. Physiatrist and rehab team continue to assess barriers to discharge/monitor patient progress toward functional and medical goals  Care Tool:  Bathing    Body parts bathed by patient: Right arm, Left arm, Chest, Abdomen, Right upper leg, Left upper leg, Left lower leg, Face, Front perineal area   Body parts bathed by helper: Buttocks Body parts n/a: Right lower leg   Bathing assist Assist Level: Minimal Assistance - Patient > 75%     Upper Body Dressing/Undressing Upper body dressing   What is the patient wearing?: Pull over shirt    Upper body assist Assist Level: Set up assist    Lower Body Dressing/Undressing Lower body dressing      What is the patient wearing?: Pants     Lower body assist Assist for lower body dressing: Moderate Assistance - Patient 50 - 74%     Toileting Toileting    Toileting assist Assist for toileting: Maximal Assistance - Patient 25 - 49%     Transfers Chair/bed transfer  Transfers assist     Chair/bed transfer assist level: Moderate Assistance - Patient 50 - 74%  Locomotion Ambulation   Ambulation assist              Walk 10 feet activity   Assist           Walk 50 feet activity   Assist           Walk 150 feet activity   Assist           Walk 10 feet on uneven surface  activity   Assist           Wheelchair     Assist               Wheelchair 50 feet with 2 turns activity    Assist            Wheelchair 150 feet activity     Assist          Blood pressure 134/76, pulse 80, temperature 98.8 F (37.1 C), temperature source Oral, resp. rate 18, height 6\' 3"  (1.905 m), weight 101.9 kg, SpO2 100 %.  Medical Problem List and Plan: 1. Functional deficits secondary to R AKA in setting of non healing wound on  RLE             -patient may  shower if covers R AKA incision             -ELOS/Goals: 18-21 days min A to supervision at w/c level  -Patient is beginning CIR therapies today including PT and OT  2.  Antithrombotics: -DVT/anticoagulation:  Pharmaceutical: Eliquis             -antiplatelet therapy: N/A 3. Pain Management:  Fentanyl 25 mcg patch w/oxycodone prn.               --monitor for sedative SE.  4. Mood/Behavior/Sleep: LCSW to follow for evaluation and support.             --melatonin prn for insomnia.              -antipsychotic agents: N/A 5. Neuropsych/cognition: This patient is capable of making decisions on his own behalf. 6. Skin/Wound Care: Monitor wound for healing. R AKA- ordered shrinker- con't staples- healing well- ordered ACE wrap for now- Eucerin ordered for dry/scaly L foot 7. Fluids/Electrolytes/Nutrition: Strict I/O w/1200 cc FR/Day  -labs with HD 8. T2DM: Hgb A1C-6.1 and diet controlled             --monitor BS ac/hs and use SSI for elevated BS. - extra sensitve SSI  CBG (last 3)  Recent Labs    03/19/22 1704 03/19/22 2102 03/20/22 0603  GLUCAP 138* 110* 92    9. ESRD: HD MWF at the end of the day to help with tolerance of activity             --daily weights w/FR and renal diet   Filed Weights   03/19/22 1419 03/20/22 0500  Weight: 101.7 kg 101.9 kg    10. OSA: continue BIPAP---pt refused. Says he doesn't use at home 11. Acute on anemia of chronic disease: Continue to monitor H/H.              --H/H improved to 8.4 12. Chronic systolic CHF: Daily wt J/0932 cc FR/day. Continue Bumex and Crestor.  --Fluid status managed with HD 13. Metabolic encephalopathy: Encourage CPAP use             --limit neuro sedating medications.  14. CAF: Monitor HR TID--continue amiodarone.  15. Diarrhea: Related to milk products  and has resolved. 16. VRE contact precautions    LOS: 1 days A FACE TO FACE EVALUATION WAS PERFORMED  Meredith Staggers 03/20/2022, 9:09 AM    ,

## 2022-03-20 NOTE — IPOC Note (Signed)
Overall Plan of Care [facility]) Patient Details Name: Adam Howell MRN: [id number] DOB: Oct 30, 1960  Admitting Diagnosis: PAD, Above knee amputation of right lower extremity [facility])  Hospital Problems: Principal Problem:   Above knee amputation of right lower extremity ([place]) Active Problems:   ESRD on dialysis ([place])   Heart failure with reduced ejection fraction ([place])     Functional Problem List: Nursing Behavior, Bladder, Bowel, Pain, Perception, Safety, Endurance, Sensory, Medication Management, Skin Integrity, Motor  PT Balance, Safety, Endurance, Pain  OT Balance, Cognition, Endurance, Pain, Skin Integrity  SLP    TR         Basic ADL's: OT Grooming, Bathing, Dressing, Toileting     Advanced  ADL's: OT Simple Meal Preparation     Transfers: PT Bed Mobility, Bed to Chair, Car, Manufacturing systems engineer, Metallurgist: PT Ambulation, Emergency planning/management officer     Additional Impairments: OT Fuctional Use of Upper Extremity  SLP        TR      Anticipated Outcomes Item Anticipated Outcome  Self Feeding S  Swallowing      Basic self-care  S  Toileting  S   Bathroom Transfers S  Bowel/Bladder  continent B/B  Transfers  supervision  Locomotion  w/c 150+ Mod I, amb w/ RW x 20' and CGA.  Communication     Cognition     Pain  less than 4  Safety/Judgment  remain fall free while in rehab   Therapy Plan: PT Intensity: Minimum of 1-2 x/day ,45 to 90 minutes PT Frequency: 5 out of 7 days PT Duration Estimated Length of Stay: 12-14 days. OT Intensity: Minimum of 1-2 x/day, 45 to 90 minutes OT Frequency: 5 out of 7 days OT Duration/Estimated Length of Stay: 12-16     Team Interventions: Nursing Interventions Patient/Family Education, Disease Management/Prevention, Skin Care/Wound Management, Discharge Planning, Bladder Management, Pain Management, Cognitive Remediation/Compensation, Psychosocial Support, Bowel Management, Medication Management  PT  interventions Ambulation/gait training, Community reintegration, Stair training, UE/LE Strength taining/ROM, Wheelchair propulsion/positioning, Discharge planning, Therapeutic Activities, Functional mobility training, Patient/family education, Therapeutic Exercise  OT Interventions Balance/vestibular training, Discharge planning, Pain management, Self Care/advanced ADL retraining, Therapeutic Activities, UE/LE Coordination activities, Visual/perceptual remediation/compensation, Therapeutic Exercise, Skin care/wound managment, Patient/family education, Functional mobility training, Disease mangement/prevention, Cognitive remediation/compensation, DME/adaptive equipment instruction, Community reintegration, Neuromuscular re-education, Psychosocial support, UE/LE Strength taining/ROM, Splinting/orthotics, Wheelchair propulsion/positioning  SLP Interventions    TR Interventions    SW/CM Interventions Discharge Planning, Psychosocial Support, Patient/Family Education   Barriers to Discharge MD  Medical stability  Nursing Decreased caregiver support, Incontinence, Wound Care, Hemodialysis, Weight bearing restrictions, Behavior Home with assist from daughter to 1 level home with level entry  PT Home environment access/layout, Hemodialysis    OT Hemodialysis    SLP      SW Decreased caregiver support, Lack of/limited family support, Insurance underwriter for SNF coverage, Hemodialysis     Team Discharge Planning: Destination: PT-Home ,OT- Home , SLP-  Projected Follow-up: PT-Home health PT, OT-  Home health OT, SLP-  Projected Equipment Needs: PT-To be determined, Wheelchair cushion (measurements), Wheelchair (measurements), OT- 3 in 1 bedside comode, Tub/shower bench, Other (comment), SLP-  Equipment Details: PT- , OT-DAC Patient/family involved in discharge planning: PT- Patient,  OT-Patient, SLP-   MD ELOS: 10-12 days Medical Rehab Prognosis:  Excellent Assessment: The patient has been admitted for CIR  therapies with the diagnosis of right AKA related to PAD. The team will be addressing functional mobility, strength,  stamina, balance, safety, adaptive techniques and equipment, self-care, bowel and bladder mgt, patient and caregiver education, pre-prosthetic ed, coping skills, pain mgt. Goals have been set at supervision for  self-care at w/c level and mod I for w/c mobility, supervision for transfers. Anticipated discharge destination is home with family.        See Team Conference Notes for weekly updates to the plan of care

## 2022-03-20 NOTE — Progress Notes (Signed)
Occupational Therapy Session Note  Patient Details  Name: Adam Howell MRN: [id number] Date of Birth: 1961-01-11  Today's Date: 03/20/2022 OT Individual Time: 0930-1030 OT Individual Time Calculation (min): 60 min    Short Term Goals: Week 1:  OT Short Term Goal 1 (Week 1): Pt will complete STS with MOD A in prep for clothing managment OT Short Term Goal 2 (Week 1): Pt will complete 2/3 steps of toileting OT Short Term Goal 3 (Week 1): Pt will transfer to toilet with MIN A and LRAD OT Short Term Goal 4 (Week 1): Pt will initiate shower level bathing training with no more than min A  Skilled Therapeutic Interventions/Progress Updates:  Pt received in bed with "no pain" the "medicine helped a lot." MOD A squat pivot transfer in 2 parts. Pt demo weakness needing more A this time for transfer to lift up onto mat.  [employer] rep present for shrinker. OT assisted with donning the shrinker onto R residual limb with pain during donning but felt better once on. Pt requesting rest break after donning shrinker. Provided 7 min rest while OT gathers limb loss education resources on mirror therapy, desensitization, and skin ckecks. Returned to room and pt retunred to chair with MOD A via sqaut pivot, then MIN A to mat. Pt needs increased time for power up and muscle recruitment. STS in stedy 3x from EOM and w/c. MOD A from EOM elevated. MAX A from w/c with cuing fo weight shift ofrward and scooting to EOM for better body mechanics. Exited session with pt seated in w/c, exit alarm on and call light in reach  Therapy Documentation Precautions:  Precautions Precautions: Fall Precaution Comments: s/p R AKA Restrictions Weight Bearing Restrictions: Yes RLE Weight Bearing: Non weight bearing General: General Chart Reviewed: Yes Family/Caregiver Present: No Vital Signs: Therapy Vitals Pulse Rate: 80 BP: 134/76 Patient Position (if appropriate): Lying Oxygen Therapy SpO2: 100 % O2 Device: Room  Air   Therapy/Group: Individual Therapy  [name] 03/20/2022, 9:53 AM

## 2022-03-20 NOTE — Progress Notes (Signed)
Inpatient Rehabilitation Yes Medication Review by a Pharmacist  A complete drug regimen review was completed for this patient to identify any potential clinically significant medication issues.  High Risk Drug Classes Is patient taking? Indication by Medication  Antipsychotic Yes Compazine-prn nausea  Anticoagulant Yes Apixaban- CAF  Antibiotic No   Opioid Yes Oxycodone-APAP: pain control  Antiplatelet No   Hypoglycemics/insulin Yes Insulin aspart SSI  Vasoactive Medication Yes Amiodarone, bumex-CAF, CHF  Chemotherapy No   Other Yes Aranesp- anemia of ESRD Crestor-HLD     Type of Medication Issue Identified Description of Issue Recommendation(s)  Drug Interaction(s) (clinically significant)     Duplicate Therapy     Allergy     No Medication Administration End Date     Incorrect Dose     Additional Drug Therapy Needed     Significant med changes from prior encounter (inform family/care partners about these prior to discharge). Coreg, losartan reported taking PTA Sevelamer Discontinued by MD on [facility] acute care admission.  Sevelamer- Restart PTA meds when and if necessary during CIR admission or at time of discharge, if warranted. Nephrology following.   Other       Clinically significant medication issues were identified that warrant physician communication and completion of prescribed/recommended actions by midnight of the next day:  No  Name of provider notified for urgent issues identified:   Provider Method of Notification:     Pharmacist comments:   Time spent performing this drug regimen review (minutes):  20   Thank you for allowing pharmacy to be part of this patients care team.  Nicole Cella, RPh Clinical Pharmacist  03/20/2022 8:02 AM

## 2022-03-21 DIAGNOSIS — S78111A Complete traumatic amputation at level between right hip and knee, initial encounter: Secondary | ICD-10-CM | POA: Diagnosis not present

## 2022-03-21 LAB — GLUCOSE, CAPILLARY
Glucose-Capillary: 104 mg/dL — ABNORMAL HIGH (ref 70–99)
Glucose-Capillary: 95 mg/dL (ref 70–99)
Glucose-Capillary: 115 mg/dL — ABNORMAL HIGH (ref 70–99)
Glucose-Capillary: 111 mg/dL — ABNORMAL HIGH (ref 70–99)

## 2022-03-21 MED ORDER — SODIUM CHLORIDE 0.9 % IV SOLN
250.0000 mg | INTRAVENOUS | Status: DC
  Administered 2022-03-23 – 2022-03-25 (×2): 250 mg via INTRAVENOUS
  Filled 2022-03-21 (×5): qty 20

## 2022-03-21 NOTE — Progress Notes (Signed)
Occupational Therapy Session Note  Patient Details  Name: Adam Howell MRN: [id number] Date of Birth: 08-26-1960  Today's Date: 03/21/2022 OT Individual Time: 9449-6759 OT Individual Time Calculation (min): 72 min   Today's Date: 03/21/2022 OT Individual Time: 1638-4665 OT Individual Time Calculation (min): 55 min  Short Term Goals: Week 1:  OT Short Term Goal 1 (Week 1): Pt will complete STS with MOD A in prep for clothing managment OT Short Term Goal 2 (Week 1): Pt will complete 2/3 steps of toileting OT Short Term Goal 3 (Week 1): Pt will transfer to toilet with MIN A and LRAD OT Short Term Goal 4 (Week 1): Pt will initiate shower level bathing training with no more than min A  Skilled Therapeutic Interventions/Progress Updates:    Session 1: Pt received in bed with 9 out of 10 pain in R residual limb. Alerted RN for pain medicaiton, Rest and repositiong provided for pain relief. Pt slow to get out of bed finsihing morning coffee. Reporting burning pain. Educated on types of pain and different interventions able to help with different types of pain: musuloskeletal, nerve, phantom etc.  ADL: Pt completes ADL at overall MOD A sit to stand at sink Level. Pt declined shower level this date Skilled interventions include: MOD A for squat piot transfer with question cuing to improve recall of wc parts management, cuing for body mechanics for sit to stand at the sink with MOD A and slow muscle recruitment needed to make transitional movements happen, MOD cuing for LB dressing technique at sit to stand level, and increased time for pt to assist OT with management of RLE shrinker. Pt grooms at sink seated with set up. Readjusted shrinker with small amount of drainage. Alerted RN to inspect incision. Pt left at end of session in bed with exit alarm on, call light in reach and all needs met   Session 2: Pt received in bed with 8 out of 10 pain in R residual limb. Rest/repositioning provided for  pain relief Squat pivot transfers with MIN A and increased time for pt to imitate movement and power up.   Therapeutic exercise W/c propulsion to/from tx destinations with A for 50% of trip for BUE warm up/cool down   Pt completes seated core and UB therex in prep for transfers/BADLs to improve BUE strength  2x8 sit ups from foam wedge with A to keep bottom half in place. Pt needing CGA for last 3 reps of each exercise 2x8 lateral crunches to 4 inch wedge  Ball toss to rebounder 3x30 (15 chest pass, 15 overhead pass) seated with supervision with no LE or UB support to challenge trunk control. 1 seated rest break on last round d/t poor activity tolerance  3x5 w/c push ups  seated on elevated mat decending each set with S-MIN A to clear buttocks 1x5 fro lowest mat height--increased A last set d/t weak in deeper ROM.   Pt left at end of session in w/c with exit alarm on, call light in reach and all needs met   Therapy Documentation Precautions:  Precautions Precautions: Fall Precaution Comments: s/p R AKA Restrictions Weight Bearing Restrictions: Yes RLE Weight Bearing: Non weight bearing  Therapy/Group: Individual Therapy  [name] 03/21/2022, 6:46 AM

## 2022-03-21 NOTE — Progress Notes (Signed)
PROGRESS NOTE   Subjective/Complaints: Pain well controlled today, occasional phantom pain but not bad and not bothersome currently; had dialysis yesterday and did well, LBM yesterday and formed. Denies other complaints today.   ROS: Patient denies fever, rash, sore throat, blurred vision, dizziness, abd pain, nausea, vomiting, diarrhea, constipation, cough, shortness of breath or chest pain, headache, or mood change.    Objective:   No results found. Recent Labs    03/19/22 1013 03/20/22 1331  WBC 7.1 9.6  HGB 8.4* 7.8*  HCT 26.8* 23.6*  PLT 265 268    Recent Labs    03/19/22 0429 03/20/22 1331  NA 134* 132*  K 3.7 4.1  CL 95* 94*  CO2 30 27  GLUCOSE 96 139*  BUN 27* 37*  CREATININE 5.15* 7.85*  CALCIUM 8.0* 8.2*     Intake/Output Summary (Last 24 hours) at 03/21/2022 0739 Last data filed at 03/20/2022 1713 Gross per 24 hour  Intake --  Output 2000 ml  Net -2000 ml         Physical Exam: Vital Signs Blood pressure 134/67, pulse 73, temperature 98.1 F (36.7 C), temperature source Oral, resp. rate 16, height 6\' 3"  (1.905 m), weight 101.9 kg, SpO2 100 %.  General: Alert and oriented x 3, No apparent distress HEENT: Head is normocephalic, atraumatic, PERRLA, EOMI, sclera anicteric, oral mucosa pink and moist, dentition intact, ext ear canals clear,  Neck: Supple without JVD or lymphadenopathy Heart: Reg rate and rhythm. No murmurs rubs or gallops Chest: CTA bilaterally without wheezes, rales, or rhonchi; no distress Abdomen: Soft, non-tender, non-distended, bowel sounds positive. Extremities: No clubbing, cyanosis. Pulses are 2+ Psych: Pt's affect is appropriate. Pt is cooperative Skin: AVF LUE, right AKA with shrinker on, no drainage through dressings/shrinker Neuro:  Alert and oriented x 3. Normal insight and awareness. Intact Memory. Normal language and speech. Cranial nerve exam unremarkable   Musculoskeletal: right residual limb remains swollen and tender    Assessment/Plan: 1. Functional deficits which require 3+ hours per day of interdisciplinary therapy in a comprehensive inpatient rehab setting. Physiatrist is providing close team supervision and 24 hour management of active medical problems listed below. Physiatrist and rehab team continue to assess barriers to discharge/monitor patient progress toward functional and medical goals  Care Tool:  Bathing    Body parts bathed by patient: Right arm, Left arm, Chest, Abdomen, Right upper leg, Left upper leg, Left lower leg, Face, Front perineal area   Body parts bathed by helper: Buttocks Body parts n/a: Right lower leg   Bathing assist Assist Level: Minimal Assistance - Patient > 75%     Upper Body Dressing/Undressing Upper body dressing   What is the patient wearing?: Pull over shirt    Upper body assist Assist Level: Set up assist    Lower Body Dressing/Undressing Lower body dressing      What is the patient wearing?: Pants     Lower body assist Assist for lower body dressing: Moderate Assistance - Patient 50 - 74%     Toileting Toileting    Toileting assist Assist for toileting: Maximal Assistance - Patient 25 - 49%     Transfers Chair/bed transfer  Transfers assist     Chair/bed transfer assist level: Maximal Assistance - Patient 25 - 49%     Locomotion Ambulation   Ambulation assist      Assist level: Moderate Assistance - Patient 50 - 74% Assistive device: Walker-rolling Max distance: 8   Walk 10 feet activity   Assist  Walk 10 feet activity did not occur: Safety/medical concerns        Walk 50 feet activity   Assist Walk 50 feet with 2 turns activity did not occur: Safety/medical concerns         Walk 150 feet activity   Assist Walk 150 feet activity did not occur: Safety/medical concerns         Walk 10 feet on uneven surface  activity   Assist Walk 10  feet on uneven surfaces activity did not occur: Safety/medical concerns         Wheelchair     Assist Is the patient using a wheelchair?: Yes Type of Wheelchair: Manual    Wheelchair assist level: Minimal Assistance - Patient > 75% Max wheelchair distance: 130    Wheelchair 50 feet with 2 turns activity    Assist        Assist Level: Minimal Assistance - Patient > 75%   Wheelchair 150 feet activity     Assist      Assist Level: Minimal Assistance - Patient > 75%   Blood pressure 134/67, pulse 73, temperature 98.1 F (36.7 C), temperature source Oral, resp. rate 16, height 6\' 3"  (1.905 m), weight 101.9 kg, SpO2 100 %.  Medical Problem List and Plan: 1. Functional deficits secondary to R AKA in setting of non healing wound on RLE             -patient may  shower if covers R AKA incision             -ELOS/Goals: 18-21 days min A to supervision at w/c level  -Continue CIR therapies including PT and OT  2.  Antithrombotics: -DVT/anticoagulation:  Pharmaceutical: Eliquis 2.5mg  BID             -antiplatelet therapy: N/A 3. Pain Management:  Tylenol PRN, Oxycodone PRN.               -monitor for sedative SE. 4. Mood/Behavior/Sleep: LCSW to follow for evaluation and support.             -melatonin 5mg  prn for insomnia.              -antipsychotic agents: N/A 5. Neuropsych/cognition: This patient is capable of making decisions on his own behalf. 6. Skin/Wound Care: Monitor wound for healing. R AKA- ordered shrinker- con't staples- healing well- ordered ACE wrap for now- Eucerin ordered for dry/scaly L foot 7. Fluids/Electrolytes/Nutrition: Strict I/O w/1200 cc FR/Day  -labs with HD  -03/21/22 labs yesterday fairly stable, continue to monitor 8. T2DM: Hgb A1C-6.1 and diet controlled             -monitor BS ac/hs and use SSI for elevated BS. - extra sensitve SSI  -03/21/22 CBGs well controlled, continue monitoring   CBG (last 3)  Recent Labs    03/20/22 1850  03/20/22 2106 03/21/22 0621  GLUCAP 89 140* 104*     9. ESRD: HD MWF at the end of the day to help with tolerance of activity             -daily weights w/FR and renal diet  -03/21/22 weight stable, dialysis  03/20/22 with 2L removed   Filed Weights   03/19/22 1419 03/20/22 0500  Weight: 101.7 kg 101.9 kg    10. OSA: continue BIPAP---pt refused. Says he doesn't use at home 11. Acute on anemia of chronic disease: Continue to monitor H/H.              -H/H improved to 8.4  -03/21/22 Hgb 7.8 yesterday, monitor with dialysis labs 12. Chronic systolic CHF: Daily wt M/5465 cc FR/day. Continue Bumex 1mg  BID and Crestor 10mg  QD.  -Fluid status managed with HD 13. Metabolic encephalopathy: Encourage CPAP use             -limit neuro sedating medications.  14. CAF: Monitor HR TID--continue Amiodarone 200mg  BID.  15. Diarrhea: Related to milk products and has resolved. 16. VRE contact precautions    LOS: 2 days A FACE TO [place] 03/21/2022, 7:39 AM   ,

## 2022-03-21 NOTE — Progress Notes (Signed)
Physical Therapy Session Note  Patient Details  Name: Okechukwu Regnier MRN: [id number] Date of Birth: Nov 05, 1960  Today's Date: 03/21/2022 PT Individual Time: 0865-7846 PT Individual Time Calculation (min): 58 min   Short Term Goals: Week 1:  PT Short Term Goal 1 (Week 1): Pt will transfer squat pivot w/ mod to min A consistently PT Short Term Goal 2 (Week 1): Pt will transfer sit to stand w/ min A PT Short Term Goal 3 (Week 1): Pt will amb x 10' w/ min A and RW. PT Short Term Goal 4 (Week 1): Pt will negotiate w/c x 100' w/ supervision  Skilled Therapeutic Interventions/Progress Updates:    Chart reviewed and pt agreeable to therapy. Pt received semi-reclined in bed with no c/o pain. Session focused on functional transfer practice and strength training to promote independence. Pt initiated session with transfer to EOB with CGA. Pt then talked through safe set up on [facility] for transfer and completed slide transfer to [facility] with MinA. In [facility], pt attempted 2 sit to stands with MaxA + bedrails but was unable to come to full stand. Pt then transferred to therapy gym for time conservation. In gym, pt completed 8 mins in [place] with increasing workload. Pt then completed 20ft WC mobility with LLE pulling. Pt returned to room and talked through safety of return to bed transfer and completed slide transfer with CGA.At end of session, pt was left in bed with alarm engaged, nurse call bell and all needs in reach.     Therapy Documentation Precautions:  Precautions Precautions: Fall Precaution Comments: s/p R AKA Restrictions Weight Bearing Restrictions: Yes RLE Weight Bearing: Non weight bearing    Therapy/Group: Individual Therapy  Marquette Old 03/21/2022, 12:41 PM

## 2022-03-21 NOTE — Progress Notes (Addendum)
[facility] Progress Note  Name: Adam Howell MRN: [id number] DOB: 02/04/1961  Chief Complaint:  Transferred to CIR after right AKA (which took place 03/13/22)  Subjective:  Completed dialysis yesterday. Net UF 2L. No concerns this am.   Review of systems:  Denies shortness of breath or chest pain  Denies n/v    Intake/Output Summary (Last 24 hours) at 03/21/2022 0837 Last data filed at 03/21/2022 0811 Gross per 24 hour  Intake 480 ml  Output 2000 ml  Net -1520 ml     Vitals:  Vitals:   03/20/22 1700 03/20/22 1713 03/20/22 2025 03/21/22 0320  BP: (!) 122/53 121/66 109/79 134/67  Pulse:  80 90 73  Resp:   18 16  Temp:   99.5 F (37.5 C) 98.1 F (36.7 C)  TempSrc:   Oral Oral  SpO2:   100% 100%  Weight:      Height:         Physical Exam:  General adult male in bed in no acute distress Lungs clear to auscultation bilaterally normal work of breathing at rest  Heart S1S2 no rub Abdomen soft nontender nondistended Extremities no edema, Rt AKA Access LUE AVF bruit and thrill; area of skin discoloration - patient states sticking away from this  Medications reviewed   Labs:     Latest Ref Rng & Units 03/20/2022    1:31 PM 03/19/2022    4:29 AM 03/18/2022    3:55 PM  BMP  Glucose 70 - 99 mg/dL 139  96  133   BUN 6 - 20 mg/dL 37  27  37   Creatinine 0.61 - 1.24 mg/dL 7.85  5.15  7.12   Sodium 135 - 145 mmol/L 132  134  130   Potassium 3.5 - 5.1 mmol/L 4.1  3.7  3.7   Chloride 98 - 111 mmol/L 94  95  91   CO2 22 - 32 mmol/L 27  30  28    Calcium 8.9 - 10.3 mg/dL 8.2  8.0  8.0    [place]. Followed by [facility]. Dr. Smith Mince.  MWF 4.5h EDW 113.6kg 450/800 2K.2.5Ca  AVF 14 g x2 Mircera  100 q 4 weeks Sensipar 60 TIW Calcitriol 1.5 TIW  Assessment/Plan:   Right AKA - per rehab team    ESRD - on HD per MWF schedule. Next HD 1/8.  Note that he will have a lower EDW s/p right AKA.   HTN - acceptable    Anemia CKD - Hb trend down. Increased  aranesp dose to 100 mcg every Friday. Tsat 18% Ferritin 198 - will give IV Fe    Metabolic bone disease - phos acceptable. Continue home meds   Disposition - in CIR for rehab    Lynnda Child, PA-C 03/21/2022 8:37 AM  I have seen and examined this patient and agree with the plan of care.  Not stable enough to weigh yet on floor scale. We can try at dialyze with and without wheelchair with hoyer lift in near future. - No absolute indication for RRT and the patient appears to be  comfortable. Next HD on Monday   Dwana Melena, MD 03/21/2022, 10:18 AM

## 2022-03-22 DIAGNOSIS — S78111A Complete traumatic amputation at level between right hip and knee, initial encounter: Secondary | ICD-10-CM | POA: Diagnosis not present

## 2022-03-22 LAB — GLUCOSE, CAPILLARY
Glucose-Capillary: 96 mg/dL (ref 70–99)
Glucose-Capillary: 107 mg/dL — ABNORMAL HIGH (ref 70–99)
Glucose-Capillary: 98 mg/dL (ref 70–99)
Glucose-Capillary: 108 mg/dL — ABNORMAL HIGH (ref 70–99)

## 2022-03-22 MED ORDER — SENNA 8.6 MG PO TABS
2.0000 | ORAL_TABLET | Freq: Every day | ORAL | Status: DC
  Administered 2022-03-22 – 2022-04-03 (×13): 17.2 mg via ORAL
  Filled 2022-03-22 (×13): qty 2

## 2022-03-22 NOTE — Progress Notes (Addendum)
[facility] Progress Note  Name: Adam Howell MRN: [id number] DOB: 01-Aug-1960   Chief Complaint:  Transferred to CIR after right AKA (which took place 03/13/22)  [place]. Followed by [facility]. Dr. Smith Mince.  MWF 4.5h EDW 113.6kg 450/800 2K.2.5Ca  AVF 14 g x2 Mircera  100 q 4 weeks Sensipar 60 TIW Calcitriol 1.5 TIW  Assessment/Plan:   Right AKA - per rehab team    ESRD - on HD per MWF schedule. Next HD 1/8.  Note that he will have a lower EDW s/p right AKA.   - Will try to get a  hoyer to assist with obtaining a true weight.  - Not stable enough to weigh yet on floor scale.  - No absolute indication for RRT and the patient appears to be  comfortable. Next HD on 1/8.  HTN - acceptable    Anemia CKD - Hb trend down. Increased aranesp dose to 100 mcg every Friday. Tsat 18% Ferritin 198 - will give IV Fe (written for but I don't see any given on dialysis). Wrote order to please give tomorrow.   Metabolic bone disease - phos acceptable. Continue home meds   Disposition - in CIR for rehab    Subjective:  No complaints this am.   Review of systems:  Denies shortness of breath or chest pain  Denies n/v    Intake/Output Summary (Last 24 hours) at 03/22/2022 0821 Last data filed at 03/21/2022 2200 Gross per 24 hour  Intake 714 ml  Output --  Net 714 ml    Vitals:  Vitals:   03/21/22 0320 03/21/22 1336 03/21/22 2001 03/22/22 0433  BP: 134/67 (!) 104/49 110/64 139/66  Pulse: 73 73 71 76  Resp: 16 17 20 16   Temp: 98.1 F (36.7 C) 99.5 F (37.5 C) 99.6 F (37.6 C) 98.5 F (36.9 C)  TempSrc: Oral Oral Oral Oral  SpO2: 100% 100% 99% 100%  Weight:    104.8 kg  Height:         Physical Exam:  General adult male in bed in no acute distress Lungs clear to auscultation bilaterally normal work of breathing at rest  Heart S1S2 no rub Abdomen soft nontender nondistended Extremities no edema, Rt AKA Access LUE AVF bruit and thrill; area of skin  discoloration - patient states sticking away from this  Medications reviewed   Labs:     Latest Ref Rng & Units 03/20/2022    1:31 PM 03/19/2022    4:29 AM 03/18/2022    3:55 PM  BMP  Glucose 70 - 99 mg/dL 139  96  133   BUN 6 - 20 mg/dL 37  27  37   Creatinine 0.61 - 1.24 mg/dL 7.85  5.15  7.12   Sodium 135 - 145 mmol/L 132  134  130   Potassium 3.5 - 5.1 mmol/L 4.1  3.7  3.7   Chloride 98 - 111 mmol/L 94  95  91   CO2 22 - 32 mmol/L 27  30  28    Calcium 8.9 - 10.3 mg/dL 8.2  8.0  8.0       Arihana Ambrocio, Hunt Oris, MD 03/22/2022, 8:21 AM

## 2022-03-22 NOTE — Progress Notes (Signed)
PROGRESS NOTE   Subjective/Complaints: Pt feeling pretty good today, no complaints, up in bed eating breakfast. Having good BMs and slept pretty well. Denies any concerns at this time.   ROS: Patient denies fever, rash, sore throat, blurred vision, dizziness, abd pain, nausea, vomiting, diarrhea, constipation, cough, shortness of breath or chest pain, headache, or mood change. Denies feeling fluid overloaded.    Objective:   No results found. Recent Labs    03/19/22 1013 03/20/22 1331  WBC 7.1 9.6  HGB 8.4* 7.8*  HCT 26.8* 23.6*  PLT 265 268    Recent Labs    03/20/22 1331  NA 132*  K 4.1  CL 94*  CO2 27  GLUCOSE 139*  BUN 37*  CREATININE 7.85*  CALCIUM 8.2*     Intake/Output Summary (Last 24 hours) at 03/22/2022 0723 Last data filed at 03/21/2022 2200 Gross per 24 hour  Intake 1194 ml  Output --  Net 1194 ml         Physical Exam: Vital Signs Blood pressure 139/66, pulse 76, temperature 98.5 F (36.9 C), temperature source Oral, resp. rate 16, height 6\' 3"  (1.905 m), weight 104.8 kg, SpO2 100 %.  General: Alert and oriented x 3, No apparent distress HEENT: Head is normocephalic, atraumatic, PERRLA, EOMI, sclera anicteric, oral mucosa pink and moist, dentition intact, ext ear canals clear,  Neck: Supple without JVD or lymphadenopathy Heart: Reg rate and rhythm. No murmurs rubs or gallops Chest: CTA bilaterally without wheezes, rales, or rhonchi; no distress Abdomen: Soft, non-tender, non-distended, bowel sounds positive. Extremities: No clubbing, cyanosis. Pulses are 2+, no pitting edema in LLE Psych: Pt's affect is appropriate. Pt is cooperative Skin: AVF LUE, right AKA with shrinker on, no drainage through dressings/shrinker Neuro:  Alert and oriented x 3. Normal insight and awareness. Intact Memory. Normal language and speech. Cranial nerve exam unremarkable  Musculoskeletal: right residual limb  remains swollen and tender    Assessment/Plan: 1. Functional deficits which require 3+ hours per day of interdisciplinary therapy in a comprehensive inpatient rehab setting. Physiatrist is providing close team supervision and 24 hour management of active medical problems listed below. Physiatrist and rehab team continue to assess barriers to discharge/monitor patient progress toward functional and medical goals  Care Tool:  Bathing    Body parts bathed by patient: Right arm, Left arm, Chest, Abdomen, Right upper leg, Left upper leg, Left lower leg, Face, Front perineal area   Body parts bathed by helper: Buttocks Body parts n/a: Right lower leg   Bathing assist Assist Level: Minimal Assistance - Patient > 75%     Upper Body Dressing/Undressing Upper body dressing   What is the patient wearing?: Pull over shirt    Upper body assist Assist Level: Set up assist    Lower Body Dressing/Undressing Lower body dressing      What is the patient wearing?: Pants     Lower body assist Assist for lower body dressing: Moderate Assistance - Patient 50 - 74%     Toileting Toileting    Toileting assist Assist for toileting: Maximal Assistance - Patient 25 - 49%     Transfers Chair/bed transfer  Transfers assist  Chair/bed transfer assist level: Maximal Assistance - Patient 25 - 49%     Locomotion Ambulation   Ambulation assist      Assist level: Moderate Assistance - Patient 50 - 74% Assistive device: Walker-rolling Max distance: 8   Walk 10 feet activity   Assist  Walk 10 feet activity did not occur: Safety/medical concerns        Walk 50 feet activity   Assist Walk 50 feet with 2 turns activity did not occur: Safety/medical concerns         Walk 150 feet activity   Assist Walk 150 feet activity did not occur: Safety/medical concerns         Walk 10 feet on uneven surface  activity   Assist Walk 10 feet on uneven surfaces activity did  not occur: Safety/medical concerns         Wheelchair     Assist Is the patient using a wheelchair?: Yes Type of Wheelchair: Manual    Wheelchair assist level: Minimal Assistance - Patient > 75% Max wheelchair distance: 130    Wheelchair 50 feet with 2 turns activity    Assist        Assist Level: Minimal Assistance - Patient > 75%   Wheelchair 150 feet activity     Assist      Assist Level: Minimal Assistance - Patient > 75%   Blood pressure 139/66, pulse 76, temperature 98.5 F (36.9 C), temperature source Oral, resp. rate 16, height 6\' 3"  (1.905 m), weight 104.8 kg, SpO2 100 %.  Medical Problem List and Plan: 1. Functional deficits secondary to R AKA in setting of non healing wound on RLE             -patient may  shower if covers R AKA incision             -ELOS/Goals: 18-21 days min A to supervision at w/c level  -Continue CIR therapies including PT and OT  2.  Antithrombotics: -DVT/anticoagulation:  Pharmaceutical: Eliquis 2.5mg  BID             -antiplatelet therapy: N/A 3. Pain Management:  Tylenol PRN, Oxycodone PRN.               -monitor for sedative SE. 4. Mood/Behavior/Sleep: LCSW to follow for evaluation and support.             -melatonin 5mg  prn for insomnia.              -antipsychotic agents: N/A 5. Neuropsych/cognition: This patient is capable of making decisions on his own behalf. 6. Skin/Wound Care: Monitor wound for healing. R AKA- ordered shrinker- con't staples- healing well- ordered ACE wrap for now- Eucerin ordered for dry/scaly L foot 7. Fluids/Electrolytes/Nutrition: Strict I/O w/1200 cc FR/Day  -labs with HD  -03/21/22 labs yesterday fairly stable, continue to monitor 8. T2DM: Hgb A1C-6.1 and diet controlled             -monitor BS ac/hs and use SSI for elevated BS. - extra sensitve SSI  -03/22/22 CBGs well controlled, continue monitoring   CBG (last 3)  Recent Labs    03/21/22 1651 03/21/22 2116 03/22/22 0621  GLUCAP  115* 111* 96     9. ESRD: HD MWF at the end of the day to help with tolerance of activity             -daily weights w/FR and renal diet -03/22/22 weight up, but doesn't appear fluid overloaded, suspect scale difference,  continue to monitor   [facility] Weights   03/19/22 1419 03/20/22 0500 03/22/22 0433  Weight: 101.7 kg 101.9 kg 104.8 kg    10. OSA: continue BIPAP---pt refused. Says he doesn't use at home 11. Acute on anemia of chronic disease: Continue to monitor H/H.              -H/H improved to 8.4  -03/21/22 Hgb 7.8 yesterday, monitor with dialysis labs 12. Chronic systolic CHF: Daily wt X/3244 cc FR/day. Continue Bumex 1mg  BID and Crestor 10mg  QD.  -Fluid status managed with HD, no evidence of fluid overload 03/22/22 13. Metabolic encephalopathy: Encourage CPAP use             -limit neuro sedating medications.  14. CAF: Monitor HR TID--continue Amiodarone 200mg  BID.  15. Diarrhea: Related to milk products and has resolved. 16. VRE contact precautions -03/22/22 spoke with Colette Ribas with infection prevention, reports he can come off of contact precautions given wound removal/AKA.     LOS: 3 days A FACE TO [place] 03/22/2022, 7:23 AM   ,

## 2022-03-22 NOTE — Progress Notes (Signed)
Pt refuses bipap 

## 2022-03-23 DIAGNOSIS — I739 Peripheral vascular disease, unspecified: Secondary | ICD-10-CM | POA: Diagnosis not present

## 2022-03-23 DIAGNOSIS — N186 End stage renal disease: Secondary | ICD-10-CM | POA: Diagnosis not present

## 2022-03-23 DIAGNOSIS — S78111A Complete traumatic amputation at level between right hip and knee, initial encounter: Secondary | ICD-10-CM | POA: Diagnosis not present

## 2022-03-23 DIAGNOSIS — E1169 Type 2 diabetes mellitus with other specified complication: Secondary | ICD-10-CM | POA: Diagnosis not present

## 2022-03-23 LAB — RENAL FUNCTION PANEL
Albumin: 2.3 g/dL — ABNORMAL LOW (ref 3.5–5.0)
Anion gap: 13 (ref 5–15)
BUN: 42 mg/dL — ABNORMAL HIGH (ref 6–20)
CO2: 27 mmol/L (ref 22–32)
Calcium: 8.1 mg/dL — ABNORMAL LOW (ref 8.9–10.3)
Chloride: 93 mmol/L — ABNORMAL LOW (ref 98–111)
Creatinine, Ser: 10.02 mg/dL — ABNORMAL HIGH (ref 0.61–1.24)
GFR, Estimated: 5 mL/min — ABNORMAL LOW (ref >60)
Glucose, Bld: 97 mg/dL (ref 70–99)
Phosphorus: 4.1 mg/dL (ref 2.5–4.6)
Potassium: 4.7 mmol/L (ref 3.5–5.1)
Sodium: 133 mmol/L — ABNORMAL LOW (ref 135–145)

## 2022-03-23 LAB — GLUCOSE, CAPILLARY
Glucose-Capillary: 86 mg/dL (ref 70–99)
Glucose-Capillary: 94 mg/dL (ref 70–99)
Glucose-Capillary: 107 mg/dL — ABNORMAL HIGH (ref 70–99)
Glucose-Capillary: 109 mg/dL — ABNORMAL HIGH (ref 70–99)

## 2022-03-23 LAB — CBC
HCT: 22.2 % — ABNORMAL LOW (ref 39.0–52.0)
Hemoglobin: 7.3 g/dL — ABNORMAL LOW (ref 13.0–17.0)
MCH: 29 pg (ref 26.0–34.0)
MCHC: 32.9 g/dL (ref 30.0–36.0)
MCV: 88.1 fL (ref 80.0–100.0)
Platelets: 295 10*3/uL (ref 150–400)
RBC: 2.52 MIL/uL — ABNORMAL LOW (ref 4.22–5.81)
RDW: 17.8 % — ABNORMAL HIGH (ref 11.5–15.5)
WBC: 9 10*3/uL (ref 4.0–10.5)
nRBC: 0 % (ref 0.0–0.2)

## 2022-03-23 IMAGING — CR CHEST 2 VWS PA LAT
1 series · 3 of 3 positions shown · non-contrast
Comparison: None

HISTORY/INDICATIONS: Chest pain
TECHNIQUE: Chest 3 views.

[Series 1: pa · 0.17mm/px · 3 of 3 slices shown]
[im 1/3]
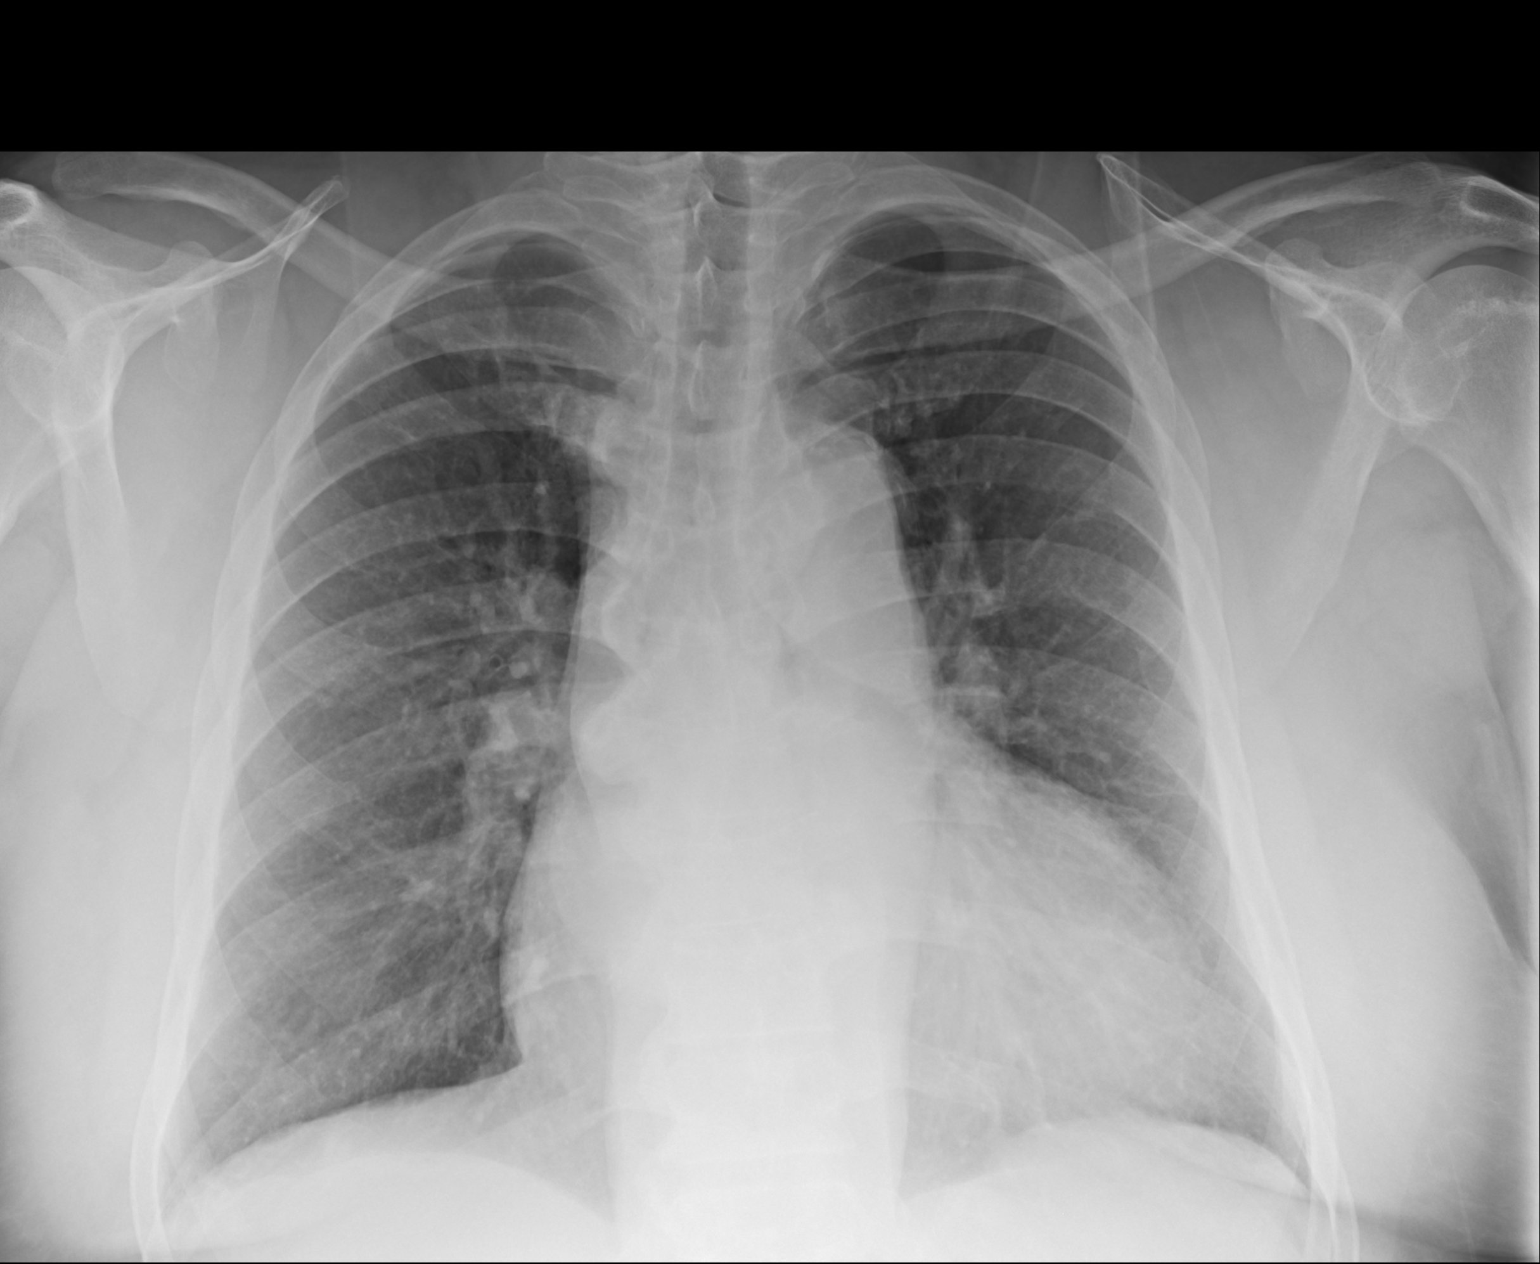
[im 2/3]
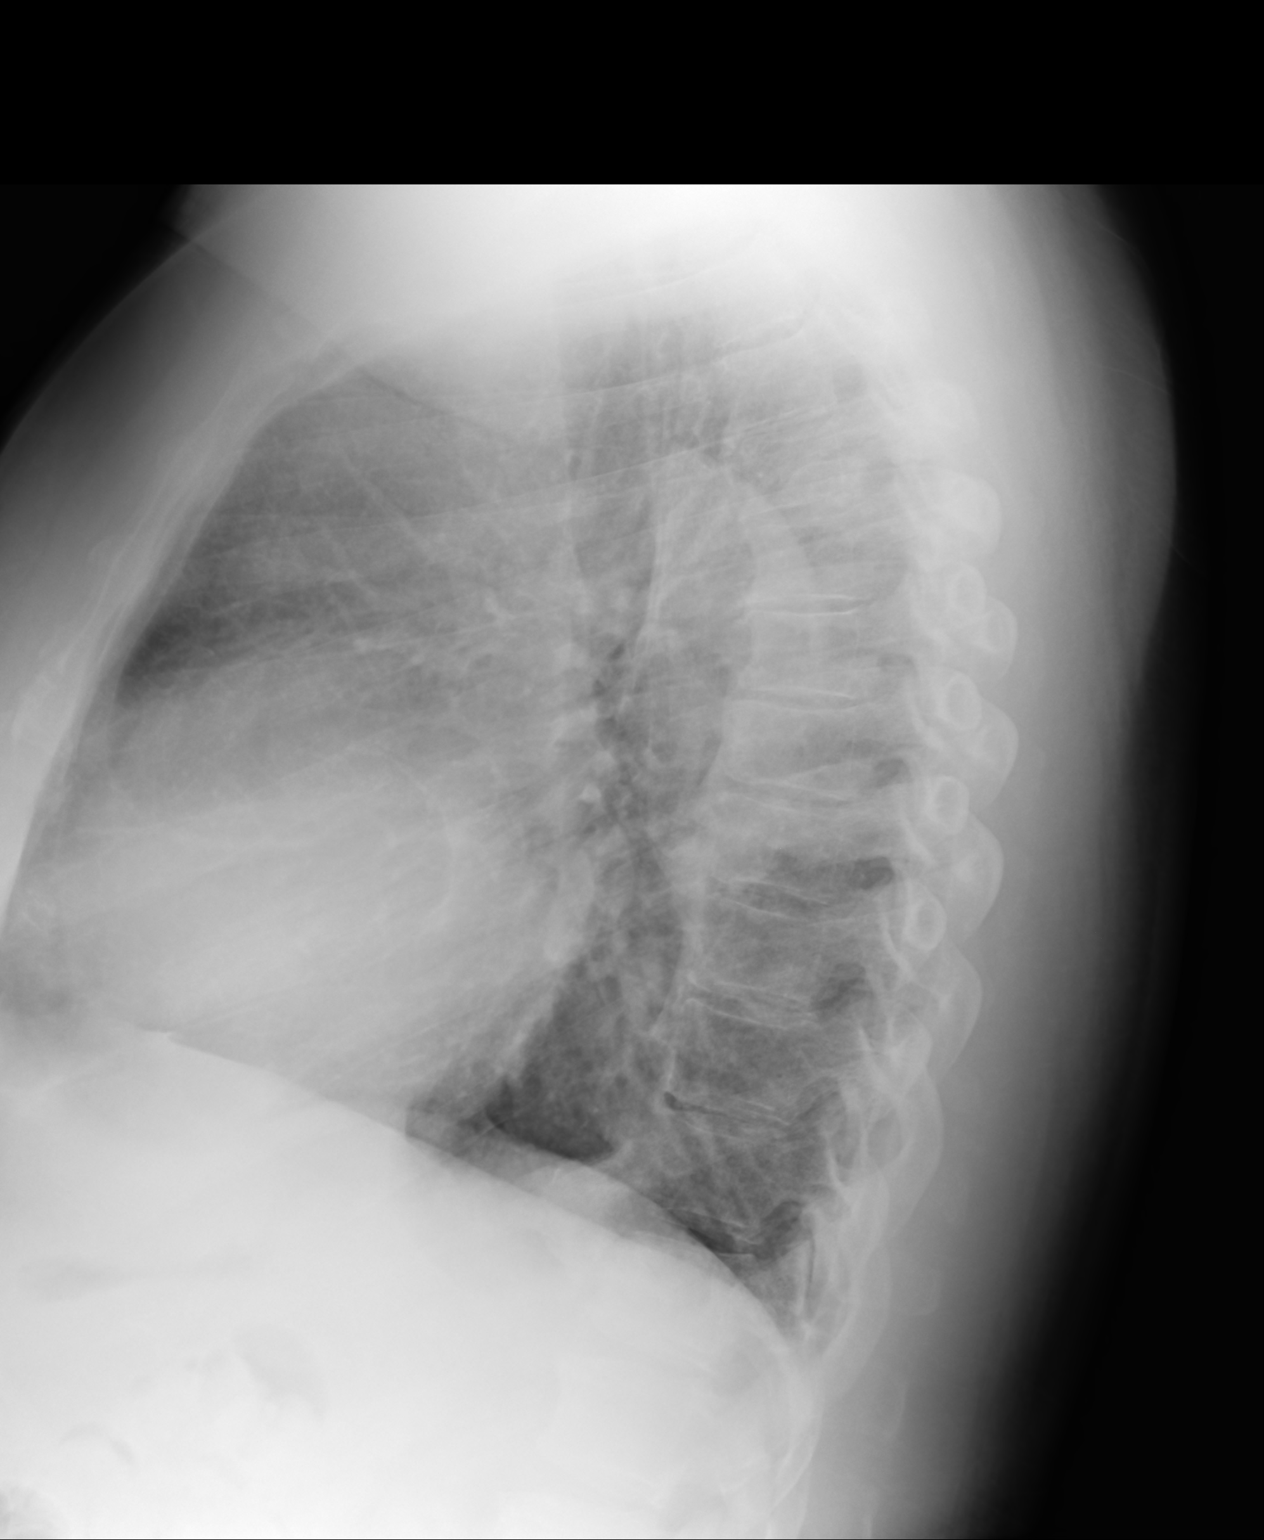
[im 3/3]
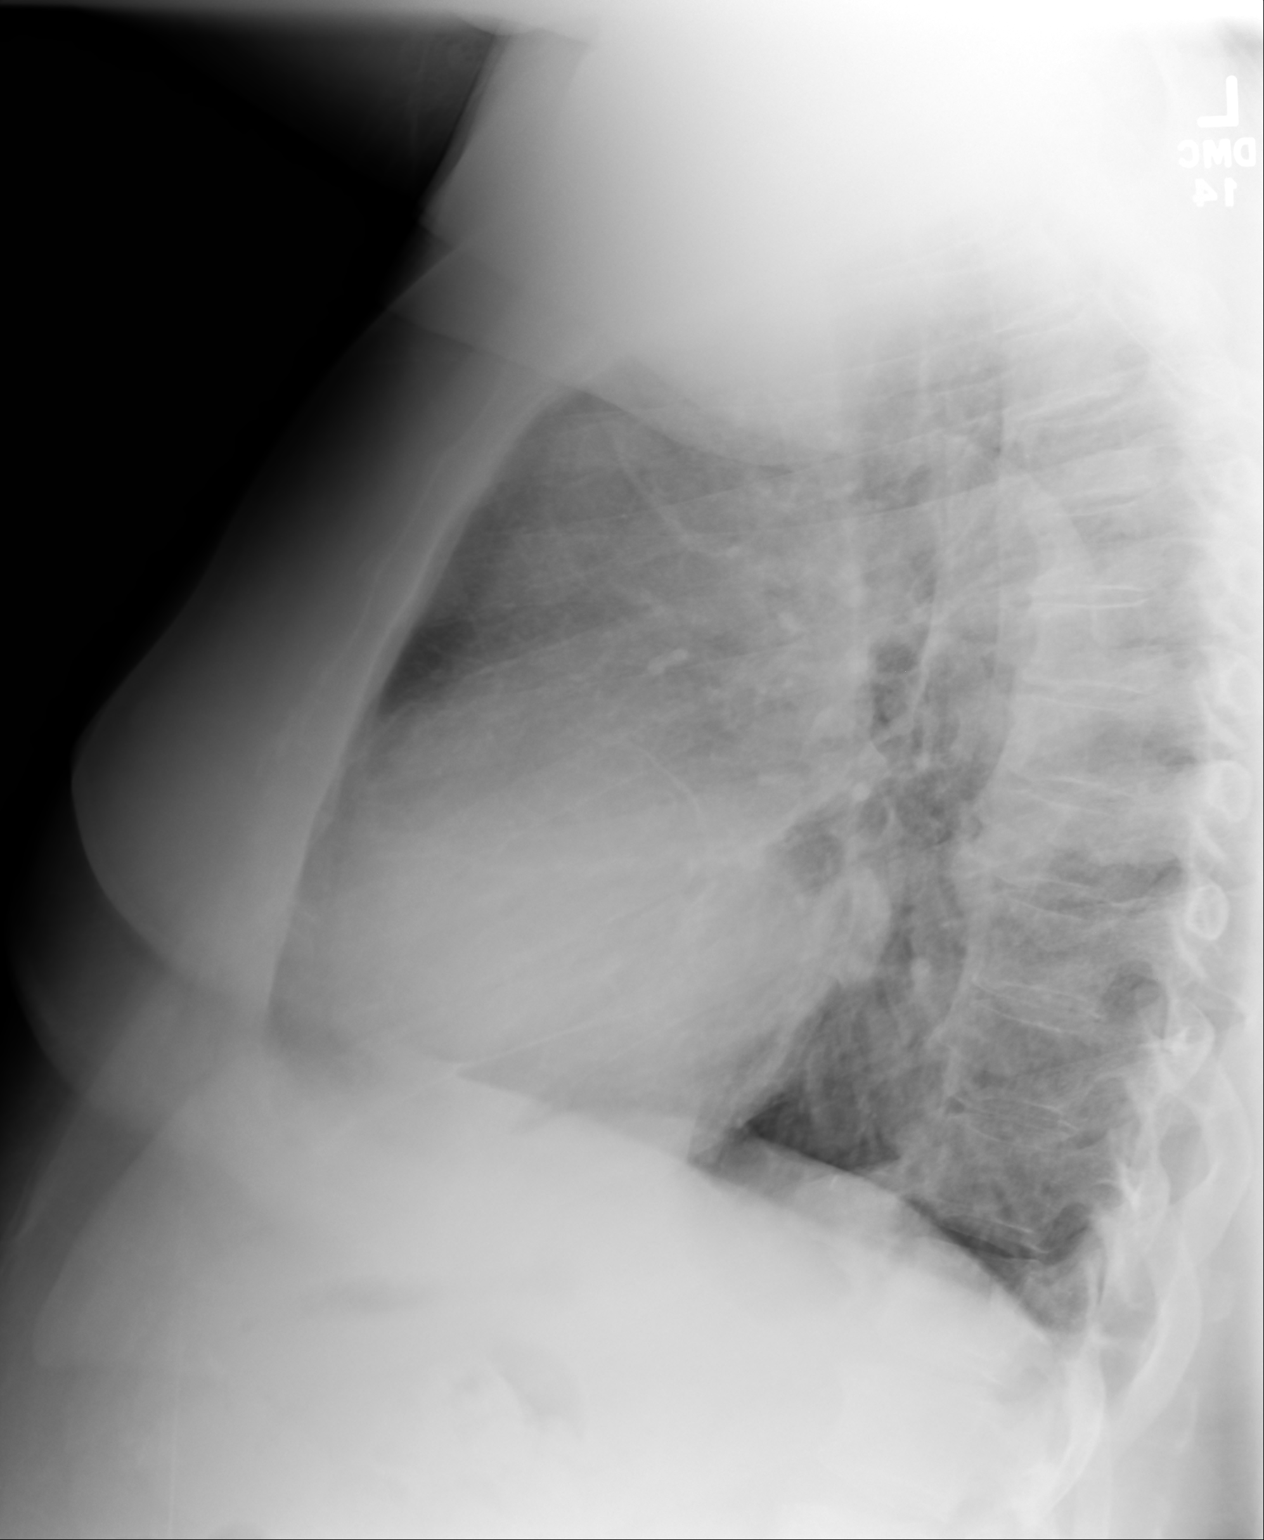

[3 of 3 positions shown; findings below may reference images not displayed]

FINDINGS: 3 views of the chest demonstrate cardiomegaly and mild vascular cephalization. No focal infiltrates. No pleural fluid. No pneumothorax. Osseous structures unremarkable.
IMPRESSION: Cardiomegaly and vascular cephalization. No pleural fluid. No focal infiltrate. Correlate for CHF/fluid overload.

IMPORTANT FINDINGS!

## 2022-03-23 NOTE — Progress Notes (Signed)
[facility] Progress Note  Subjective: seen in gym on CIR  Vitals:   03/22/22 1922 03/23/22 0140 03/23/22 0340 03/23/22 1416  BP: (!) 146/76  136/87 (!) 110/51  Pulse: 72  78 73  Resp: 16  16 15   Temp: 99.3 F (37.4 C)  98.9 F (37.2 C) 98.1 F (36.7 C)  TempSrc: Oral  Oral Oral  SpO2: 98% 96% 95% 96%  Weight:   104.5 kg   Height:        Exam: General adult male in bed in no acute distress Lungs clear to auscultation bilaterally normal WOB Heart S1S2 no rub Abdomen soft nontender nondistended Extremities no edema, Rt AKA Access LUE AVF bruit and thrill; area of skin discoloration, per pt we are sticking away from this    OP HD: MWF [facility]  4.5h  113.6kg   450/800   2/2.5 bath   AVF 14ga x2 - mircera 100 q4 - sensipar 60 tiw - calcitriol 1.5 mcg po tiw   Assessment/ Plan: Debility - on CIR SP R AKA  ESRD - on HD MWF. Plan HD today.  HTN - stable Vol - have lowered edw w/ AKA and vol removal, down 9kg.  Anemia esrd - Hb 7-8 range, getting esa here w/ darbe 100 ug w Friday. Tsat was 18% and IV Fe is ordered.  MBD ckd - CCa in range, phos is up a bit. Cont daily sensipar, tiw po vdra and renvela 3 ac tid as bidner.      Rob Magdaline Zollars 03/23/2022, 2:34 PM   Recent Labs  Lab 03/18/22 1555 03/19/22 0429 03/19/22 1013 03/20/22 1331  HGB  --  7.7* 8.4* 7.8*  ALBUMIN 2.3*  --   --  2.3*  CALCIUM 8.0* 8.0*  --  8.2*  PHOS 4.0  --   --  4.0  CREATININE 7.12* 5.15*  --  7.85*  K 3.7 3.7  --  4.1   Recent Labs  Lab 03/20/22 0650  IRON 34*  TIBC 189*  FERRITIN 198   Inpatient medications:  amiodarone  200 mg Oral BID   apixaban  2.5 mg Oral BID   bumetanide  1 mg Oral BID   calcitRIOL  1.5 mcg Oral Q M,W,F-1800   cinacalcet  30 mg Oral Q breakfast   Darbepoetin Alfa  100 mcg Subcutaneous Q7 days   hydrocerin   Topical BID   insulin aspart  0-6 Units Subcutaneous TID WC   multivitamin  1 tablet Oral QHS   rosuvastatin   10 mg Oral Daily   senna  2 tablet Oral QHS   sevelamer carbonate  1,600 mg Oral TID WC    ferric gluconate (FERRLECIT) IVPB     acetaminophen, bisacodyl, dextrose, diphenhydrAMINE, guaiFENesin-dextromethorphan, heparin, lidocaine-prilocaine, melatonin, milk and molasses, oxyCODONE-acetaminophen, pentafluoroprop-tetrafluoroeth, polyethylene glycol, prochlorperazine **OR** prochlorperazine **OR** prochlorperazine, simethicone

## 2022-03-23 NOTE — Progress Notes (Signed)
Pt on room air and tolerating with no distress noted. Pt refuses BIPAP QHS at this time.Will continue to monitor

## 2022-03-23 NOTE — Care Management (Signed)
[facility] Individual Statement of Services  Patient Name:  Adam Howell  Date:  03/23/2022  Welcome to the [facility].  Our goal is to provide you with an individualized program based on your diagnosis and situation, designed to meet your specific needs.  With this comprehensive rehabilitation program, you will be expected to participate in at least 3 hours of rehabilitation therapies Monday-Friday, with modified therapy programming on the weekends.  Your rehabilitation program will include the following services:  Physical Therapy (PT), Occupational Therapy (OT), 24 hour per day rehabilitation nursing, Therapeutic Recreaction (TR), Psychology, Neuropsychology, Care Coordinator, Rehabilitation Medicine, [place], and Other  Weekly team conferences will be held on Tuesdays to discuss your progress.  Your Inpatient Rehabilitation Care Coordinator will talk with you frequently to get your input and to update you on team discussions.  Team conferences with you and your family in attendance may also be held.  Expected length of stay: 12-16 days    Overall anticipated outcome: Supervision  Depending on your progress and recovery, your program may change. Your Inpatient Rehabilitation Care Coordinator will coordinate services and will keep you informed of any changes. Your Inpatient Rehabilitation Care Coordinator's name and contact numbers are listed  below.  The following services may also be recommended but are not provided by the [facility] will be made to provide these services after discharge if needed.  Arrangements include referral to agencies that provide these services.  Your insurance has been verified to be:  [employer]  Your primary doctor is:   Warden/ranger  Pertinent information will be shared with your doctor and your insurance company.  Inpatient Rehabilitation Care Coordinator:  Cathleen Corti [phone] or [phone]  Information discussed with and copy given to patient by: Rana Snare, 03/23/2022, 10:39 AM

## 2022-03-23 NOTE — Progress Notes (Signed)
Pt receives out-pt HD at [facility] on MWF. Will assist as needed.   Derika Eckles Renal Navigator [phone] 

## 2022-03-23 NOTE — Progress Notes (Signed)
Occupational Therapy Session Note  Patient Details  Name: Adam Howell MRN: [id number] Date of Birth: March 20, 1960  Today's Date: 03/23/2022 OT Individual Time: 6734-1937 OT Individual Time Calculation (min): 58 min    Short Term Goals: Week 1:  OT Short Term Goal 1 (Week 1): Pt will complete STS with MOD A in prep for clothing managment OT Short Term Goal 2 (Week 1): Pt will complete 2/3 steps of toileting OT Short Term Goal 3 (Week 1): Pt will transfer to toilet with MIN A and LRAD OT Short Term Goal 4 (Week 1): Pt will initiate shower level bathing training with no more than min A  Skilled Therapeutic Interventions/Progress Updates: Patient seen for ADL training as listed below. Received resting in bed. Needed Mod assist to get to EOB and transfer to w/c, squat pivot. Patient sat for sponge bath at the sink. Required assist with LLE/foot. Educated patient on skin care and the importance of skin checks for the L foot. Patient required mod assist to stand and wash back of upper legs and buttocks. Patient sat for grooming tasks, Independent/set up. LE dressing- patient needed assist to stand and total assist to manage shorts while in standing. Following dressing and bathing tasks patient educated on skin care post amputation and the importance of RLE skin checks throughout the healing process and once he is fitted for a prosthetic. Patient educated on scar management and desensitization techniques in preparation for a prosthetic but will need follow up. Continue with skilled OT POC.     Therapy Documentation Precautions:  Precautions Precautions: Fall Precaution Comments: s/p R AKA Restrictions Weight Bearing Restrictions: Yes RLE Weight Bearing: Non weight bearing General:   Vital Signs:   Pain: Pain Assessment Pain Scale: 0-10 Pain Score: 6  Pain Type: Acute pain;Surgical pain Pain Location: Leg Pain Orientation: Right Pain Intervention(s): Medication (See  eMAR) ADL: ADL Eating: Set up Where Assessed-Eating: Bed level Grooming: Supervision/safety Where Assessed-Grooming: Sitting at sink Upper Body Bathing: Setup Where Assessed-Upper Body Bathing: sink Lower Body Bathing: Moderate assistance Where Assessed-Lower Body Bathing: sink Upper Body Dressing: set-up Where Assessed-Upper Body Dressing: w/c Lower Body Dressing: Max assist Where Assessed-Lower Body Dressing: w/c Balance- Requrired BUE support to maintain standing balance for bathing and dressing tasks.    Therapy/Group: Individual Therapy  Hermina Barters 03/23/2022, 12:18 PM

## 2022-03-23 NOTE — Progress Notes (Signed)
Physical Therapy Session Note  Patient Details  Name: Adam Howell MRN: [id number] Date of Birth: Aug 01, 1960  Today's Date: 03/23/2022 PT Individual Time: 9528-4132 and 1110-1210 PT Individual Time Calculation (min): 72 min and 60 min  Short Term Goals: Week 1:  PT Short Term Goal 1 (Week 1): Pt will transfer squat pivot w/ mod to min A consistently PT Short Term Goal 2 (Week 1): Pt will transfer sit to stand w/ min A PT Short Term Goal 3 (Week 1): Pt will amb x 10' w/ min A and RW. PT Short Term Goal 4 (Week 1): Pt will negotiate w/c x 100' w/ supervision  Skilled Therapeutic Interventions/Progress Updates:     Session 1: Patient in bed upon PT arrival. Patient alert and agreeable to PT session. Patient reported 9/10 R residual limb pain during session, RN made aware and provided pain medication during session. PT provided repositioning, rest breaks, and distraction as pain interventions throughout session. Reduced to 7/10 throughout session.   Patient with shrinker donned with banding with second layer, noted visible soiling to shrinker. Doffed soiled shrinker, reviewed residual limb and well limb care and washed and applied lotion to residual limb above incision site, well limb done by RN this morning. Educated on shrinker purpose, wear schedule, hygiene, and donning/doffing technique. PT donned new shrinker demonstrating technique with increased time effort due to lower extremity edema and body habitus. Took breaks to patient's tolerance due to elevated pain level. Washed soiled shrinker sock during rest breaks to hang dry for later use. MD rounded on patient rest break, alerted to tight fit of shrinker sock, encouraged continue wear of this size to promote reduced edema at this time.   Discussed POC, home set-up, and PT goals during session.  Therapeutic Activity: Bed Mobility: Patient performed supine to/from L side-lying and partial prone to progress to full prone for hip  extension with min A with use of bed rails in a flat bed, tolerated well. Provided verbal cues for sequencing and shifting of positioning hospital bed.  Therapeutic Exercise: Patient performed the following exercises with verbal and tactile cues for proper technique. -isometric R hip extension in supine 2x10 with 5 sec hold -R hip abd/add in supine 2x10 -R hip flexion in supine 2x10 -B glute sets with 5 sec hold -side-lying hip abduction 2x10  Patient in bed at end of session with breaks locked, bed alarm set, and all needs within reach.   Session 2: Patient in w/c in the room upon PT arrival. Patient alert and agreeable to PT session. Patient reported 8-9/10 R residual limb pain during session, RN made aware. PT provided repositioning, rest breaks, and distraction as pain interventions throughout session.   Therapeutic Activity: Transfers: Patient performed lateral scoot transfers w/c<>mat table x2 and w/c>bed with min A-CGA. Provided cues for hand placement and head-hips relationship for proper technique and decreased assist with transfers. Patient performed sit to/from stand x3 in the // bars with mod A progressing to CGA with repetition. Provided demonstration and verbal cues for hand placement, scooting forward, foot placement, forward and L weight shift, and controlled descent.  Gait Training:  Patient ambulated 3 feet and 5 feet forward and backward in the // bars with CGA. Ambulated with hop-to gait pattern on L with decreased foot clearance and forefoot contact on initial contact. Provided demonstration and verbal cues for increased use of upper extremities with shoulder depression and elbow extension to lift his foot for increased foot clearance and promotion of  heel contact at initial contact to improved step length.  Wheelchair Mobility:  Patient propelled wheelchair >50 feet x2 with supervision. Provided verbal cues for propulsion and turning technique. Noted w/c with increased  drag and veering L, changed out w/c to 18"x18" with foam cushion for improved leverage with upper extremities and improved propulsion in alternative w/c. Improved speed and efficiency with second w/c.   Patient requested his sheets be changed and he return to bed for dialysis. Discussed safety and fall risk with AKA and safe habits and behavior modifications to reduce fall risk while linens were changed.   Patient sitting EOB handed off to NT for finger stick at end of session.    Therapy Documentation Precautions:  Precautions Precautions: Fall Precaution Comments: s/p R AKA Restrictions Weight Bearing Restrictions: Yes RLE Weight Bearing: Non weight bearing    Therapy/Group: Individual Therapy  Adam Howell PT, DPT, NCS, CBIS  03/23/2022, 9:59 AM

## 2022-03-23 NOTE — Progress Notes (Signed)
PROGRESS NOTE   Subjective/Complaints: No new issues. PT a little concerned that shrinker might be too tight to pull/keep up thigh. Pain controlled. No complaints today  ROS: Patient denies fever, rash, sore throat, blurred vision, dizziness, nausea, vomiting, diarrhea, cough, shortness of breath or chest pain,   headache, or mood change.    Objective:   No results found. Recent Labs    03/19/22 1013 03/20/22 1331  WBC 7.1 9.6  HGB 8.4* 7.8*  HCT 26.8* 23.6*  PLT 265 268    Recent Labs    03/20/22 1331  NA 132*  K 4.1  CL 94*  CO2 27  GLUCOSE 139*  BUN 37*  CREATININE 7.85*  CALCIUM 8.2*     Intake/Output Summary (Last 24 hours) at 03/22/2022 0723 Last data filed at 03/21/2022 2200 Gross per 24 hour  Intake 1194 ml  Output --  Net 1194 ml         Physical Exam: Vital Signs Blood pressure 139/66, pulse 76, temperature 98.5 F (36.9 C), temperature source Oral, resp. rate 16, height 6\' 3"  (1.905 m), weight 104.8 kg, SpO2 100 %.  Constitutional: No distress . Vital signs reviewed. HEENT: NCAT, EOMI, oral membranes moist Neck: supple Cardiovascular: RRR without murmur. No JVD    Respiratory/Chest: CTA Bilaterally without wheezes or rales. Normal effort    GI/Abdomen: BS +, non-tender, non-distended Ext: no clubbing, cyanosis, or edema Psych: pleasant and cooperative  Skin: AVF LUE, right AKA with shrinker on, minimal to no drainage evident Neuro:  Alert and oriented x 3. Normal insight and awareness. Intact Memory. Normal language and speech. Cranial nerve exam unremarkable  Musculoskeletal: right residual limb with swelling, remains tender to touch    Assessment/Plan: 1. Functional deficits which require 3+ hours per day of interdisciplinary therapy in a comprehensive inpatient rehab setting. Physiatrist is providing close team supervision and 24 hour management of active medical problems listed  below. Physiatrist and rehab team continue to assess barriers to discharge/monitor patient progress toward functional and medical goals  Care Tool:  Bathing    Body parts bathed by patient: Right arm, Left arm, Chest, Abdomen, Right upper leg, Left upper leg, Left lower leg, Face, Front perineal area   Body parts bathed by helper: Buttocks Body parts n/a: Right lower leg   Bathing assist Assist Level: Minimal Assistance - Patient > 75%     Upper Body Dressing/Undressing Upper body dressing   What is the patient wearing?: Pull over shirt    Upper body assist Assist Level: Set up assist    Lower Body Dressing/Undressing Lower body dressing      What is the patient wearing?: Pants     Lower body assist Assist for lower body dressing: Moderate Assistance - Patient 50 - 74%     Toileting Toileting    Toileting assist Assist for toileting: Maximal Assistance - Patient 25 - 49%     Transfers Chair/bed transfer  Transfers assist     Chair/bed transfer assist level: Maximal Assistance - Patient 25 - 49%     Locomotion Ambulation   Ambulation assist      Assist level: Moderate Assistance - Patient 50 -  74% Assistive device: Walker-rolling Max distance: 8   Walk 10 feet activity   Assist  Walk 10 feet activity did not occur: Safety/medical concerns        Walk 50 feet activity   Assist Walk 50 feet with 2 turns activity did not occur: Safety/medical concerns         Walk 150 feet activity   Assist Walk 150 feet activity did not occur: Safety/medical concerns         Walk 10 feet on uneven surface  activity   Assist Walk 10 feet on uneven surfaces activity did not occur: Safety/medical concerns         Wheelchair     Assist Is the patient using a wheelchair?: Yes Type of Wheelchair: Manual    Wheelchair assist level: Minimal Assistance - Patient > 75% Max wheelchair distance: 130    Wheelchair 50 feet with 2 turns  activity    Assist        Assist Level: Minimal Assistance - Patient > 75%   Wheelchair 150 feet activity     Assist      Assist Level: Minimal Assistance - Patient > 75%   Blood pressure 139/66, pulse 76, temperature 98.5 F (36.9 C), temperature source Oral, resp. rate 16, height 6\' 3"  (1.905 m), weight 104.8 kg, SpO2 100 %.  Medical Problem List and Plan: 1. Functional deficits secondary to R AKA in setting of non healing wound on RLE             -patient may  shower if covers R AKA incision             -ELOS/Goals: 18-21 days min A to supervision at w/c level  -Continue CIR therapies including PT, OT   2.  Antithrombotics: -DVT/anticoagulation:  Pharmaceutical: Eliquis 2.5mg  BID             -antiplatelet therapy: N/A 3. Pain Management:  Tylenol PRN, Oxycodone PRN.               -monitor for sedative SE. 4. Mood/Behavior/Sleep: LCSW to follow for evaluation and support.             -melatonin 5mg  prn for insomnia.              -antipsychotic agents: N/A 5. Neuropsych/cognition: This patient is capable of making decisions on his own behalf. 6. Skin/Wound Care: Monitor wound for healing. R AKA- ordered shrinker- con't staples- healing well- ordered ACE wrap for now- Eucerin ordered for dry/scaly L foot 7. Fluids/Electrolytes/Nutrition: Strict I/O w/1200 cc FR/Day  -labs with HD 8. T2DM: Hgb A1C-6.1 and diet controlled             -monitor BS ac/hs and use SSI for elevated BS. - extra sensitve SSI  -03/23/22 CBGs well controlled, continue monitoring   CBG (last 3)  Recent Labs    03/21/22 1651 03/21/22 2116 03/22/22 0621  GLUCAP 115* 111* 96     9. ESRD: HD MWF at the end of the day to help with tolerance of activity             -daily weights w/FR and renal diet -1/8 weight up yesterday. Due HD today   Filed Weights   03/19/22 1419 03/20/22 0500 03/22/22 0433  Weight: 101.7 kg 101.9 kg 104.8 kg    10. OSA: continue BIPAP---pt refused. Says he doesn't  use at home 11. Acute on anemia of chronic disease: Continue to monitor H/H.              -  H/H improved to 8.4  -03/21/22 Hgb 7.8 yesterday, monitor with dialysis labs 12. Chronic systolic CHF: Daily wt O/1308 cc FR/day. Continue Bumex 1mg  BID and Crestor 10mg  QD.  -Fluid status managed with HD, no evidence of fluid overload 03/22/22 13. Metabolic encephalopathy: Encourage CPAP use             -limit neuro sedating medications.  14. CAF: Monitor HR TID--continue Amiodarone 200mg  BID.  15. Diarrhea: Related to milk products and has resolved. 16. VRE contact precautions - off of contact precautions given wound removal/AKA.     LOS: 3 days A FACE TO [place] 03/22/2022, 7:23 AM   ,

## 2022-03-23 NOTE — Procedures (Signed)
HD Note:  Some information was entered later than the data was gathered due to patient care needs. The stated time with the data is accurate.  Received patient in bed to unit.  Alert and oriented.  Informed consent signed and in chart.   Patient tolerated well.  Transported back to the room  Alert, without acute distress.  Hand-off given to patient's nurse.   Access used: Left arm AVF Access issues: None  Total UF removed: 2500 ml   [facility]

## 2022-03-23 NOTE — Progress Notes (Signed)
[name], male   DOB: [birth year], 61 y.o.   MRN: [id number]  [id number] left message for pt dtr [name] informing on ELOS. SW shared will follow-up with updates after team conference.   Loralee Pacas, MSW, [place] Office: [phone] Cell: [phone] Fax: [phone]

## 2022-03-24 DIAGNOSIS — I739 Peripheral vascular disease, unspecified: Secondary | ICD-10-CM | POA: Diagnosis not present

## 2022-03-24 DIAGNOSIS — S78111A Complete traumatic amputation at level between right hip and knee, initial encounter: Secondary | ICD-10-CM | POA: Diagnosis not present

## 2022-03-24 DIAGNOSIS — E1169 Type 2 diabetes mellitus with other specified complication: Secondary | ICD-10-CM | POA: Diagnosis not present

## 2022-03-24 DIAGNOSIS — N186 End stage renal disease: Secondary | ICD-10-CM | POA: Diagnosis not present

## 2022-03-24 LAB — GLUCOSE, CAPILLARY
Glucose-Capillary: 73 mg/dL (ref 70–99)
Glucose-Capillary: 93 mg/dL (ref 70–99)
Glucose-Capillary: 94 mg/dL (ref 70–99)
Glucose-Capillary: 122 mg/dL — ABNORMAL HIGH (ref 70–99)

## 2022-03-24 MED ORDER — CHLORHEXIDINE GLUCONATE CLOTH 2 % EX PADS: 6.0000 | MEDICATED_PAD | Freq: Every day | CUTANEOUS | Status: DC

## 2022-03-24 NOTE — Progress Notes (Signed)
Occupational Therapy Session Note  Patient Details  Name: Adam Howell MRN: [id number] Date of Birth: 05-10-1960  Today's Date: 03/24/2022 OT Individual Time: 9470-9628 OT Individual Time Calculation (min): 43 min   Today's Date: 03/24/2022 OT Individual Time: 1300-1355 OT Individual Time Calculation (min): 55 min   Short Term Goals: Week 1:  OT Short Term Goal 1 (Week 1): Pt will complete STS with MOD A in prep for clothing managment OT Short Term Goal 2 (Week 1): Pt will complete 2/3 steps of toileting OT Short Term Goal 3 (Week 1): Pt will transfer to toilet with MIN A and LRAD OT Short Term Goal 4 (Week 1): Pt will initiate shower level bathing training with no more than min A  Skilled Therapeutic Interventions/Progress Updates:     Pt received in bed with 8 out of 10 pain in R residual limb. Rest and repositiong provided for pain relief  Therapeutic activity Focus of sesison on w/c parts management, functional transfers, transitional movements and 1UE support while walking. Cuing for body mechanics during sit to stand and hand placement during transfers. Pt does best during sit to stand with RUE up on surface and LUE pushing from w/c arm rest. Pt able ot offweight  1 UE in stance to pull 15 magnets off of board.   Pt left at end of session in w/c with exit alarm on, call light in reach and all needs met  Session 2:  Pt received in w/c with unrated residual limb pain. Pt already received medication. Rest and repositiong provided for pain relief  Therapeutic exercise Pt completes w/c propulsion to/from all tx destinations for BUE strengthening  Pt completes 3x1 min beach ball volley in unsupported sitting position with 5 # dowel rod for dynamic balance, postural control, BUE strengthening and endurance required for BADLs and functional transfers.    Pt completes 2x10-15 dowel rod therex for BUE shoulder strengthening required for BADLs/functional transfers as follows with  demo cuing and 7 # dowel rod  Shoulder flex/ext, shoulder press, horizonal ab/adduct Circles in B directions Elbow flex/ext, chest press Wrist flex/ext    Therapeutic activity 5x sit to stand with RW from elevated mat with RW. Pt initially resistant to 1UE down on mat. Pt trails BUE pushing up from walker with increased wobbling on walker with OT educating on poor mechnaics and increased stress on shoulder using this method. Pt agreealbe ot using handle to mimic arm rest of chair.   Pt left at end of session in w/c with exit alarm on, call light in reach and all needs met   Therapy Documentation Precautions:  Precautions Precautions: Fall Precaution Comments: s/p R AKA Restrictions Weight Bearing Restrictions: Yes RLE Weight Bearing: Non weight bearing  Therapy/Group: Individual Therapy  [name] 03/24/2022, 6:52 AM

## 2022-03-24 NOTE — Patient Care Conference (Signed)
Inpatient RehabilitationTeam Conference and Plan of Care Update Date: 03/24/2022   Time: 10:18 AM    Patient Name: Adam Howell      Medical Record Number: [id number]  Date of Birth: Nov 14, 1960 Sex: Male         Room/Bed: 4M11C/4M11C-01 Payor Info: Payor: [employer] MEDICARE / Plan: [place] HMO / Product Type: *No Product type* /    Admit Date/Time:  03/19/2022  1:08 PM  Primary Diagnosis:  Above knee amputation of right lower extremity [facility])  Hospital Problems: Principal Problem:   Above knee amputation of right lower extremity ([place]) Active Problems:   ESRD on dialysis ([place])   Heart failure with reduced ejection fraction [facility])    Expected Discharge Date: Expected Discharge Date: 04/04/22  Team Members Present: Physician leading conference: Dr. Alger Simons Social Worker Present: Loralee Pacas, [place] Nurse Present: Tacy Learn, RN PT Present: Apolinar Junes, PT OT Present: Mariane Masters, OT PPS Coordinator present : Ileana Ladd, PT     Current Status/Progress Goal Weekly Team Focus  Bowel/Bladder   Oliguria, Intermittent incontinence of bowel.   Regain full continence   Assist with toileting as needed    Swallow/Nutrition/ Hydration               ADL's   min-mod A for BADLs/sit to stand, MIN A squat pivot transfers, MOD A   supervision   balance, endurance, functional transfers, sit to stand, FOF, strengthening    Mobility   min A overall, sit to stand and gait in // bars up to 5 feet limited by upper extremity strength and endurance, w/c supervision >50 ft   Supervision-CGA with mobility up to 30 ft ambulation, mod I w/c mobility  Activity tolerance, balance, functional mobility, strength/ROM, amputee education, gait training, w/c mobility, patient/caregiver education    Communication                Safety/Cognition/ Behavioral Observations               Pain   C/O pain to RLE. PRN medication available and being  requested Q4 hours while awake per pt request.   Pain <3/10   Assess Qshift and prn    Skin   Rt AKA, skin tear to buttocks, remainder of skin intact.   Promote healing and prevention of infection  Assess Qshift and prn      Discharge Planning:  D/c to home with her daughter   Team Discussion: Right AKA. Oliguric. Incontinence of stool. 9/10 pain to op site controlled with PRN medications. Incision to AKA closed with staples, no drainage noted. Shrinker in place. Skin tear to R/L buttocks healing. HD M/W/F. Renal diet with 1200 FR. Daily weight. Blood sugars controlled. Min A for ADLs. Min A overall for PT. Does better in the afternoon. Will try to adjust therapy schedule. Slow and fatigues quickly.  Patient on target to meet rehab goals: yes, w/c supervision greater than 50 ft  *See Care Plan and progress notes for long and short-term goals.   Revisions to Treatment Plan:  Monitor labs, monitor weight.   Teaching Needs: Medications, safety, gait/transfer training, skin/wound care, etc.   Current Barriers to Discharge: Decreased caregiver support, Home enviroment access/layout, Incontinence, Wound care, Weight, Hemodialysis, and Weight bearing restrictions  Possible Resolutions to Barriers: Family education, nursing education, order recommended DME     Medical Summary Current Status: right aka d/t non-healing wound. incision cdi with shrinker. esrd on HD. pain controlled as is DM  Barriers to Discharge: Medical stability   Possible Resolutions to [employer] Focus: daily assessment of labs and pt data, transfusion in HD?, pain mgt   Continued Need for Acute Rehabilitation Level of Care: The patient requires daily medical management by a physician with specialized training in physical medicine and rehabilitation for the following reasons: Direction of a multidisciplinary physical rehabilitation program to maximize functional independence : Yes Medical management of patient  stability for increased activity during participation in an intensive rehabilitation regime.: Yes Analysis of laboratory values and/or radiology reports with any subsequent need for medication adjustment and/or medical intervention. : Yes   I attest that I was present, lead the team conference, and concur with the assessment and plan of the team.   Ernest Pine 03/24/2022, 1:52 PM

## 2022-03-24 NOTE — Progress Notes (Addendum)
Physical Therapy Session Note  Patient Details  Name: Adam Howell MRN: [id number] Date of Birth: 05/20/1960  Today's Date: 03/24/2022 PT Individual Time: 0830-0900 PT Individual Time Calculation (min): 30 min   Short Term Goals: Week 1:  PT Short Term Goal 1 (Week 1): Pt will transfer squat pivot w/ mod to min A consistently PT Short Term Goal 2 (Week 1): Pt will transfer sit to stand w/ min A PT Short Term Goal 3 (Week 1): Pt will amb x 10' w/ min A and RW. PT Short Term Goal 4 (Week 1): Pt will negotiate w/c x 100' w/ supervision  Skilled Therapeutic Interventions/Progress Updates:     Session 1: Patient in bed starting to eat breakfast at 0800 upon PT arrival. Patient requested time to eat prior to starting therapy session. Patient completed breakfast upon PT return 30 min later. Patient alert and agreeable to PT session. Patient reported 7/10 R residual limb pain during session, RN made aware and provided pain medication during session. PT provided repositioning, rest breaks, and distraction as pain interventions throughout session.   Patient with shrinker sock donned and maintained good positioning throughout the night. Patient reports pulling it up as instructed to maintain proper positioning. MD arrived during session. Assisted with doffing/donning shrinker for MD assessment of patient's residual limb. Patient still not able to tolerate doubling over the shrinker at this time, will continue trials as edema reduces.   Therapeutic Exercise: Patient performed the following exercises with verbal and tactile cues for proper technique. -supine R hip flexor stretch with residual limb off the bed 2x1 min with gentle manual over pressure  -R SLR 2x12 -R hip abd/add 2x10 -L bridging with R SLR 2x5  Patient in bed at end of session, declined getting OOB until OT session this morning due to fatigue, with breaks locked, bed alarm set, and all needs within reach.   Session 2: Patient  in w/c in the room upon PT arrival. Patient alert and agreeable to PT session. Patient reported 2-5/10 R residual limb pain during session, RN made aware. PT provided repositioning, rest breaks, and distraction as pain interventions throughout session.   Therapeutic Activity: Bed Mobility: Patient performed sit to supine with supervision. Provided verbal cues for reduced use of hospital bed features to simulate home set-up. Transfers: Patient performed lateral scoot w/c>bed with CGA-min A to the R. Patient able to teach-back >50% of w/c set-up and transfer technique, required mod cues for w/c positioning, hand placement, and head-hips relationship.  Educated on positioning in bed, encouraged lying flat at least 10 min 2x per day to extend his R hip to reduce flexor contracture. Reviewed self massage for tactile feedback on spasm management.   Patient in bed lying flat at end of session with breaks locked, bed alarm set, and all needs within reach.   Therapy Documentation Precautions:  Precautions Precautions: Fall Precaution Comments: s/p R AKA Restrictions Weight Bearing Restrictions: Yes RLE Weight Bearing: Non weight bearing General: PT Amount of Missed Time (min): 30 Minutes PT Missed Treatment Reason: Other (Comment) (patient eating breakfast)    Therapy/Group: Individual Therapy  Nichelle Renwick L Marceline Napierala PT, DPT, NCS, CBIS  03/24/2022, 6:31 PM

## 2022-03-24 NOTE — Progress Notes (Signed)
PROGRESS NOTE   Subjective/Complaints: Continues to do well. PT about to start with him when I entered this am. Pain controlled. Shrinker seems to be fitting better  ROS: Patient denies fever, rash, sore throat, blurred vision, dizziness, nausea, vomiting, diarrhea, cough, shortness of breath or chest pain,   headache, or mood change.    Objective:   No results found. Recent Labs    03/23/22 1530  WBC 9.0  HGB 7.3*  HCT 22.2*  PLT 295   Recent Labs    03/23/22 1530  NA 133*  K 4.7  CL 93*  CO2 27  GLUCOSE 97  BUN 42*  CREATININE 10.02*  CALCIUM 8.1*    Intake/Output Summary (Last 24 hours) at 03/24/2022 0950 Last data filed at 03/23/2022 2222 Gross per 24 hour  Intake 160 ml  Output 2500 ml  Net -2340 ml        Physical Exam: Vital Signs Blood pressure 118/68, pulse 75, temperature 98.6 F (37 C), temperature source Oral, resp. rate 17, height 6\' 3"  (1.905 m), weight 102.8 kg, SpO2 95 %.  Constitutional: No distress . Vital signs reviewed. HEENT: NCAT, EOMI, oral membranes moist Neck: supple Cardiovascular: RRR without murmur. No JVD    Respiratory/Chest: CTA Bilaterally without wheezes or rales. Normal effort    GI/Abdomen: BS +, non-tender, non-distended Ext: no clubbing, cyanosis, or edema Psych: pleasant and cooperative  Skin: AVF LUE, right AKA incision CDI with staples.  Neuro:  Alert and oriented x 3. Normal insight and awareness. Intact Memory. Normal language and speech. Cranial nerve exam unremarkable  Musculoskeletal: right residual limb with swelling but improving. Shrinker fitting appropriately, remains tender to touch    Assessment/Plan: 1. Functional deficits which require 3+ hours per day of interdisciplinary therapy in a comprehensive inpatient rehab setting. Physiatrist is providing close team supervision and 24 hour management of active medical problems listed below. Physiatrist and  rehab team continue to assess barriers to discharge/monitor patient progress toward functional and medical goals  Care Tool:  Bathing    Body parts bathed by patient: Right arm, Left arm, Chest, Abdomen, Front perineal area, Left upper leg, Face   Body parts bathed by helper: Left lower leg Body parts n/a: Right lower leg   Bathing assist Assist Level: Minimal Assistance - Patient > 75%     Upper Body Dressing/Undressing Upper body dressing   What is the patient wearing?: Pull over shirt    Upper body assist Assist Level: Set up assist    Lower Body Dressing/Undressing Lower body dressing      What is the patient wearing?: Pants     Lower body assist Assist for lower body dressing: Moderate Assistance - Patient 50 - 74%     Toileting Toileting    Toileting assist Assist for toileting: Maximal Assistance - Patient 25 - 49%     Transfers Chair/bed transfer  Transfers assist     Chair/bed transfer assist level: Moderate Assistance - Patient 50 - 74%     Locomotion Ambulation   Ambulation assist      Assist level: Moderate Assistance - Patient 50 - 74% Assistive device: Walker-rolling Max distance: 8  Walk 10 feet activity   Assist  Walk 10 feet activity did not occur: Safety/medical concerns        Walk 50 feet activity   Assist Walk 50 feet with 2 turns activity did not occur: Safety/medical concerns         Walk 150 feet activity   Assist Walk 150 feet activity did not occur: Safety/medical concerns         Walk 10 feet on uneven surface  activity   Assist Walk 10 feet on uneven surfaces activity did not occur: Safety/medical concerns         Wheelchair     Assist Is the patient using a wheelchair?: Yes Type of Wheelchair: Manual    Wheelchair assist level: Minimal Assistance - Patient > 75% Max wheelchair distance: 130    Wheelchair 50 feet with 2 turns activity    Assist        Assist Level: Minimal  Assistance - Patient > 75%   Wheelchair 150 feet activity     Assist      Assist Level: Minimal Assistance - Patient > 75%   Blood pressure 118/68, pulse 75, temperature 98.6 F (37 C), temperature source Oral, resp. rate 17, height 6\' 3"  (1.905 m), weight 102.8 kg, SpO2 95 %.  Medical Problem List and Plan: 1. Functional deficits secondary to R AKA in setting of non healing wound on RLE             -patient may  shower if covers R AKA incision             -ELOS/Goals: 18-21 days min A to supervision at w/c level  -Continue CIR therapies including PT and OT. Interdisciplinary team conference today to discuss goals, barriers to discharge, and dc planning.   2.  Antithrombotics: -DVT/anticoagulation:  Pharmaceutical: Eliquis 2.5mg  BID             -antiplatelet therapy: N/A 3. Pain Management:  Tylenol PRN, Oxycodone PRN.               -monitor for sedative SE. 4. Mood/Behavior/Sleep: LCSW to follow for evaluation and support.             -melatonin 5mg  prn for insomnia.              -antipsychotic agents: N/A 5. Neuropsych/cognition: This patient is capable of making decisions on his own behalf. 6. Skin/Wound Care: Monitor wound for healing. R AKA- ordered shrinker- con't staples- healing well- ordered ACE wrap for now- Eucerin ordered for dry/scaly L foot 7. Fluids/Electrolytes/Nutrition: Strict I/O w/1200 cc FR/Day  -labs with HD 8. T2DM: Hgb A1C-6.1 and diet controlled             -monitor BS ac/hs and use SSI for elevated BS. - extra sensitve SSI  -03/23/22 CBGs well controlled, continue monitoring   CBG (last 3)  Recent Labs    03/23/22 2000 03/23/22 2109 03/24/22 0640  GLUCAP 107* 109* 73    9. ESRD: HD MWF at the end of the day to help with tolerance of activity             -daily weights w/FR and renal diet -1/9 weight down post HD   Filed Weights   03/23/22 0340 03/23/22 1520 03/24/22 0419  Weight: 104.5 kg 105 kg 102.8 kg    10. OSA: continue BIPAP---pt  refused. Says he doesn't use at home 11. Acute on anemia of chronic disease:              -  1/9 hgb 7.3 yesterday  -aranesp and Fe infusions per nephrology 12. Chronic systolic CHF: Daily wt R/0475 cc FR/day. Continue Bumex 1mg  BID and Crestor 10mg  QD.  -Fluid status managed with HD as above 13. Metabolic encephalopathy: Encourage CPAP use             -limit neuro sedating medications.  14. CAF: Monitor HR TID--continue Amiodarone 200mg  BID.  15. Diarrhea: Related to milk products and has resolved. 16. VRE contact precautions - off of contact precautions given wound removal/AKA.     LOS: 5 days A FACE TO FACE EVALUATION WAS PERFORMED  Meredith Staggers 03/24/2022, 9:50 AM   ,

## 2022-03-24 NOTE — Progress Notes (Signed)
Advised by CSW of pt's d/c date of 1/20. Contacted [facility] to make clinic aware of pt's d/c date and pt should resume on 1/22.  Melven Sartorius Renal Navigator [phone]

## 2022-03-24 NOTE — Progress Notes (Signed)
[name], male   DOB: June 26, 1960, 62 y.o.   MRN: [id number]  SW met with pt in room to provide updates from team conference and d/c date 1/20. He was aware that SW will follow-up with his dtr.   SW informed Olivia Mackie Mounce/Dialysis SW on pt d/c date.   1533-SW spoke with pt dtr [name] to inform on above. She asked about ALF/short term rehab. SW explained the difference of facilities/. She stated that pt will need to consider placement as she currently resides in housing and moving in [address] housing and he is not on her lease. SW discussed if she can speak with her father about placement as he has not mentioned this to SW. She states she has been calling pt and he has not called her back. SW encouraged her to come visit him when able too given today's current weather to speak with him. SW share dif he is not in agreement, SW unable to pursue placement. SW explained SNF placement process and insurance process.  SW also asked her if has considered speaking with housing to see if they would allow a temporary situation for her father. She will follow-up with [place], MSW, [place] Office: [phone] Cell: [phone] Fax: [phone]

## 2022-03-25 DIAGNOSIS — E1169 Type 2 diabetes mellitus with other specified complication: Secondary | ICD-10-CM | POA: Diagnosis not present

## 2022-03-25 DIAGNOSIS — N186 End stage renal disease: Secondary | ICD-10-CM | POA: Diagnosis not present

## 2022-03-25 DIAGNOSIS — S78111A Complete traumatic amputation at level between right hip and knee, initial encounter: Secondary | ICD-10-CM | POA: Diagnosis not present

## 2022-03-25 DIAGNOSIS — I739 Peripheral vascular disease, unspecified: Secondary | ICD-10-CM | POA: Diagnosis not present

## 2022-03-25 LAB — RENAL FUNCTION PANEL
Albumin: 2.3 g/dL — ABNORMAL LOW (ref 3.5–5.0)
Anion gap: 10 (ref 5–15)
BUN: 17 mg/dL (ref 6–20)
CO2: 29 mmol/L (ref 22–32)
Calcium: 7.9 mg/dL — ABNORMAL LOW (ref 8.9–10.3)
Chloride: 95 mmol/L — ABNORMAL LOW (ref 98–111)
Creatinine, Ser: 5.12 mg/dL — ABNORMAL HIGH (ref 0.61–1.24)
GFR, Estimated: 12 mL/min — ABNORMAL LOW (ref >60)
Glucose, Bld: 89 mg/dL (ref 70–99)
Phosphorus: 2.4 mg/dL — ABNORMAL LOW (ref 2.5–4.6)
Potassium: 3.6 mmol/L (ref 3.5–5.1)
Sodium: 134 mmol/L — ABNORMAL LOW (ref 135–145)

## 2022-03-25 LAB — CBC
HCT: 23 % — ABNORMAL LOW (ref 39.0–52.0)
Hemoglobin: 7.4 g/dL — ABNORMAL LOW (ref 13.0–17.0)
MCH: 28.7 pg (ref 26.0–34.0)
MCHC: 32.2 g/dL (ref 30.0–36.0)
MCV: 89.1 fL (ref 80.0–100.0)
Platelets: 300 10*3/uL (ref 150–400)
RBC: 2.58 MIL/uL — ABNORMAL LOW (ref 4.22–5.81)
RDW: 18.2 % — ABNORMAL HIGH (ref 11.5–15.5)
WBC: 6.5 10*3/uL (ref 4.0–10.5)
nRBC: 0 % (ref 0.0–0.2)

## 2022-03-25 LAB — GLUCOSE, CAPILLARY
Glucose-Capillary: 85 mg/dL (ref 70–99)
Glucose-Capillary: 66 mg/dL — ABNORMAL LOW (ref 70–99)
Glucose-Capillary: 89 mg/dL (ref 70–99)
Glucose-Capillary: 112 mg/dL — ABNORMAL HIGH (ref 70–99)

## 2022-03-25 MED ORDER — SORBITOL 70 % SOLN
30.0000 mL | Freq: Every day | Status: DC | PRN
  Administered 2022-03-25: 30 mL via ORAL
  Filled 2022-03-25: qty 30

## 2022-03-25 MED ORDER — HEPARIN SODIUM (PORCINE) 1000 UNIT/ML DIALYSIS
1500.0000 [IU] | INTRAMUSCULAR | Status: DC | PRN
  Administered 2022-03-25: 1500 [IU] via INTRAVENOUS_CENTRAL
  Filled 2022-03-25: qty 2

## 2022-03-25 NOTE — Progress Notes (Signed)
Physical Therapy Session Note  Patient Details  Name: Adam Howell MRN: [id number] Date of Birth: 15-Jun-1960  Today's Date: 03/25/2022 PT Individual Time: 3428-7681 [phone] PT Individual Time Calculation (min): 10 min  and 20 min   Short Term Goals: Week 1:  PT Short Term Goal 1 (Week 1): Pt will transfer squat pivot w/ mod to min A consistently PT Short Term Goal 2 (Week 1): Pt will transfer sit to stand w/ min A PT Short Term Goal 3 (Week 1): Pt will amb x 10' w/ min A and RW. PT Short Term Goal 4 (Week 1): Pt will negotiate w/c x 100' w/ supervision  Skilled Therapeutic Interventions/Progress Updates:   Pt received supine in bed. Attempted bed mobility but pt reporting sever pain in the RLE 9/10 limiting willingness to participate. RN notified and provided pain medicaiton. Pt reports willingness to participate once pain meds become effective. PT returned in 30 min and pt agreeable to bed level therapy.   Supine therex:   LAQ x 10 BLE, hip abduction x 12 BLE, LLE ankle PF x 15 with manual resistance, quad set x 12 LLE, glute set x 10 RLE. Heel slide x 12 LLE. Cues for full ROM and hold at end range  Pt  left supine in bed with call bell in reach and all needs met.        Therapy Documentation Precautions:  Precautions Precautions: Fall Precaution Comments: s/p R AKA Restrictions Weight Bearing Restrictions: Yes RLE Weight Bearing: Non weight bearing General: PT Amount of Missed Time (min): 30 Minutes PT Missed Treatment Reason: Pain    Pain: Pain Assessment Pain Scale: 0-10 Pain Score: 9  Pain Type: Surgical pain Pain Location: Leg Pain Orientation: Right Pain Descriptors / Indicators: Aching Pain Onset: On-going Pain Intervention(s): RN made aware    Therapy/Group: Individual Therapy  Lorie Phenix 03/25/2022, 12:56 PM

## 2022-03-25 NOTE — Progress Notes (Signed)
PROGRESS NOTE   Subjective/Complaints: Says pain is controlled. Denies any dizziness or light-headedness  ROS: Patient denies fever, rash, sore throat, blurred vision, dizziness, nausea, vomiting, diarrhea, cough, shortness of breath or chest pain, joint or back/neck pain, headache, or mood change.    Objective:   No results found. Recent Labs    03/23/22 1530  WBC 9.0  HGB 7.3*  HCT 22.2*  PLT 295   Recent Labs    03/23/22 1530  NA 133*  K 4.7  CL 93*  CO2 27  GLUCOSE 97  BUN 42*  CREATININE 10.02*  CALCIUM 8.1*    Intake/Output Summary (Last 24 hours) at 03/25/2022 0832 Last data filed at 03/25/2022 0755 Gross per 24 hour  Intake 717 ml  Output --  Net 717 ml        Physical Exam: Vital Signs Blood pressure 103/60, pulse 70, temperature 97.8 F (36.6 C), temperature source Oral, resp. rate 17, height 6\' 3"  (1.905 m), weight 102.3 kg, SpO2 98 %.  Constitutional: No distress . Vital signs reviewed. HEENT: NCAT, EOMI, oral membranes moist Neck: supple Cardiovascular: RRR without murmur. No JVD    Respiratory/Chest: CTA Bilaterally without wheezes or rales. Normal effort    GI/Abdomen: BS +, non-tender, non-distended Ext: no clubbing, cyanosis, or edema Psych: pleasant and cooperative  Skin: AVF LUE, right AKA incision CDI with staples.  Neuro:  Alert and oriented x 3. Normal insight and awareness. Intact Memory. Normal language and speech. Cranial nerve exam unremarkable  Musculoskeletal: right residual limb with swelling but improving. Shrinker fitting appropriately, remains tender to touch    Assessment/Plan: 1. Functional deficits which require 3+ hours per day of interdisciplinary therapy in a comprehensive inpatient rehab setting. Physiatrist is providing close team supervision and 24 hour management of active medical problems listed below. Physiatrist and rehab team continue to assess barriers  to discharge/monitor patient progress toward functional and medical goals  Care Tool:  Bathing    Body parts bathed by patient: Right arm, Left arm, Chest, Abdomen, Front perineal area, Left upper leg, Face   Body parts bathed by helper: Left lower leg Body parts n/a: Right lower leg   Bathing assist Assist Level: Minimal Assistance - Patient > 75%     Upper Body Dressing/Undressing Upper body dressing   What is the patient wearing?: Pull over shirt    Upper body assist Assist Level: Set up assist    Lower Body Dressing/Undressing Lower body dressing      What is the patient wearing?: Pants     Lower body assist Assist for lower body dressing: Moderate Assistance - Patient 50 - 74%     Toileting Toileting    Toileting assist Assist for toileting: Maximal Assistance - Patient 25 - 49%     Transfers Chair/bed transfer  Transfers assist     Chair/bed transfer assist level: Moderate Assistance - Patient 50 - 74%     Locomotion Ambulation   Ambulation assist      Assist level: Moderate Assistance - Patient 50 - 74% Assistive device: Walker-rolling Max distance: 8   Walk 10 feet activity   Assist  Walk 10 feet activity did  not occur: Safety/medical concerns        Walk 50 feet activity   Assist Walk 50 feet with 2 turns activity did not occur: Safety/medical concerns         Walk 150 feet activity   Assist Walk 150 feet activity did not occur: Safety/medical concerns         Walk 10 feet on uneven surface  activity   Assist Walk 10 feet on uneven surfaces activity did not occur: Safety/medical concerns         Wheelchair     Assist Is the patient using a wheelchair?: Yes Type of Wheelchair: Manual    Wheelchair assist level: Minimal Assistance - Patient > 75% Max wheelchair distance: 130    Wheelchair 50 feet with 2 turns activity    Assist        Assist Level: Minimal Assistance - Patient > 75%    Wheelchair 150 feet activity     Assist      Assist Level: Minimal Assistance - Patient > 75%   Blood pressure 103/60, pulse 70, temperature 97.8 F (36.6 C), temperature source Oral, resp. rate 17, height 6\' 3"  (1.905 m), weight 102.3 kg, SpO2 98 %.  Medical Problem List and Plan: 1. Functional deficits secondary to R AKA in setting of non healing wound on RLE             -patient may  shower if covers R AKA incision             -ELOS/Goals: 18-21 days min A to supervision at w/c level  -Continue CIR therapies including PT, OT   2.  Antithrombotics: -DVT/anticoagulation:  Pharmaceutical: Eliquis 2.5mg  BID             -antiplatelet therapy: N/A 3. Pain Management:  Tylenol PRN, Oxycodone PRN.               -monitor for sedative SE. 4. Mood/Behavior/Sleep: LCSW to follow for evaluation and support.             -melatonin 5mg  prn for insomnia.              -antipsychotic agents: N/A 5. Neuropsych/cognition: This patient is capable of making decisions on his own behalf. 6. Skin/Wound Care: Monitor wound for healing. R AKA- ordered shrinker- con't staples- healing well- ordered ACE wrap for now- Eucerin ordered for dry/scaly L foot 7. Fluids/Electrolytes/Nutrition: Strict I/O w/1200 cc FR/Day  -labs with HD 8. T2DM: Hgb A1C-6.1 and diet controlled             -monitor BS ac/hs and use SSI for elevated BS. - extra sensitve SSI  -03/25/22 CBGs well controlled, change to qd only   CBG (last 3)  Recent Labs    03/24/22 1637 03/24/22 2054 03/25/22 0615  GLUCAP 94 122* 85    9. ESRD: HD MWF at the end of the day to help with tolerance of activity             -daily weights w/FR and renal diet -1/10 weight stable today   Filed Weights   03/23/22 1520 03/24/22 0419 03/25/22 0416  Weight: 105 kg 102.8 kg 102.3 kg    10. OSA: continue BIPAP---pt refused. Says he doesn't use at home 11. Acute on anemia of chronic disease:              -1/9 hgb 7.3    -aranesp and Fe  infusions per nephrology  -asymptomatic 12. Chronic systolic  CHF: Daily wt w/1200 cc FR/day. Continue Bumex 1mg  BID and Crestor 10mg  QD.  -Fluid status managed with HD as above 13. Metabolic encephalopathy: Encourage CPAP use             -limit neuro sedating medications.  14. CAF: Monitor HR TID--continue Amiodarone 200mg  BID.  15. Diarrhea: Related to milk products and has resolved. 16. VRE contact precautions - off of contact precautions given wound removal/AKA.     LOS: 6 days A FACE TO FACE EVALUATION WAS PERFORMED  Meredith Staggers 03/25/2022, 8:32 AM   ,

## 2022-03-25 NOTE — Progress Notes (Signed)
[facility] Progress Note  Subjective: seen in HD unit  Vitals:   03/24/22 0419 03/24/22 1450 03/24/22 1927 03/25/22 0416  BP:  (!) 102/54 (!) 116/59 103/60  Pulse:  73 70 70  Resp:  17 17 17   Temp:  98 F (36.7 C) 98.7 F (37.1 C) 97.8 F (36.6 C)  TempSrc:  Oral Oral Oral  SpO2:  96% 98% 98%  Weight: 102.8 kg   102.3 kg  Height:        Exam: General adult male in bed in no acute distress Lungs clear to auscultation bilat Heart S1S2 no rub Abdomen soft nontender Extremities no edema, Rt AKA Access LUE AVF +bruit; area of skin discoloration, per pt we are sticking away from this    OP HD: MWF [facility]  4.5h  113.6kg   450/800   2/2.5 bath   AVF 14ga x2 - mircera 100 q4 - sensipar 60 tiw - calcitriol 1.5 mcg po tiw   Assessment/ Plan: Debility - on CIR SP R AKA  ESRD - on HD MWF. Plan HD today.  HTN - stable Vol - have lowered edw w/ AKA and vol removal, down 10-11kg.  Anemia esrd - Hb 7-8 range, getting esa here w/ darbe 100 ug w Friday. Tsat was 18% and IV Fe is ordered.  MBD ckd - CCa in range, phos is up a bit. Cont daily sensipar, tiw po vdra and renvela 3 ac tid as bidner.      Rob Doctor, hospital 03/25/2022, 1:51 PM   Recent Labs  Lab 03/20/22 1331 03/23/22 1530  HGB 7.8* 7.3*  ALBUMIN 2.3* 2.3*  CALCIUM 8.2* 8.1*  PHOS 4.0 4.1  CREATININE 7.85* 10.02*  K 4.1 4.7    Recent Labs  Lab 03/20/22 0650  IRON 34*  TIBC 189*  FERRITIN 198    Inpatient medications:  amiodarone  200 mg Oral BID   apixaban  2.5 mg Oral BID   bumetanide  1 mg Oral BID   calcitRIOL  1.5 mcg Oral Q M,W,F-1800   Chlorhexidine Gluconate Cloth  6 each Topical Q0600   cinacalcet  30 mg Oral Q breakfast   Darbepoetin Alfa  100 mcg Subcutaneous Q7 days   hydrocerin   Topical BID   multivitamin  1 tablet Oral QHS   rosuvastatin  10 mg Oral Daily   senna  2 tablet Oral QHS   sevelamer carbonate  1,600 mg Oral TID WC    ferric gluconate  (FERRLECIT) IVPB 250 mg (03/23/22 1611)   acetaminophen, bisacodyl, dextrose, diphenhydrAMINE, guaiFENesin-dextromethorphan, heparin, [START ON 03/26/2022] heparin, lidocaine-prilocaine, melatonin, milk and molasses, oxyCODONE-acetaminophen, pentafluoroprop-tetrafluoroeth, polyethylene glycol, prochlorperazine **OR** prochlorperazine **OR** prochlorperazine, simethicone, sorbitol

## 2022-03-25 NOTE — Progress Notes (Addendum)
   03/25/22 1740  Vitals  Temp 98.5 F (36.9 C)  Temp Source Oral  BP (!) 118/54  MAP (mmHg) 71  BP Location Right Arm  BP Method Automatic  Patient Position (if appropriate) Lying  Pulse Rate 69  Pulse Rate Source Monitor  ECG Heart Rate 69  Resp 19  Oxygen Therapy  SpO2 99 %  O2 Device Room Air  Pulse Oximetry Type Continuous   Received patient in bed to unit.  Alert and oriented.  Informed consent signed and in chart.   Lab lost the pre tx RFP sample that we sent  Treatment initiated: 1344 Treatment completed: 0786  Patient tolerated well.  Transported back to the room  Alert, without acute distress.  Hand-off given to patient's nurse.   Access used: AVF Access issues: NA  Total UF removed: 1030 Medication(s) given:Heparin 1500 unjits bolus,  Ferric Gluconate 250mg  IVPB  Post HD VS: see above Post HD weight: 102.9kg   [facility]

## 2022-03-25 NOTE — Progress Notes (Signed)
Pt refused CPAP

## 2022-03-25 NOTE — Significant Event (Signed)
Hypoglycemic Event  CBG: 66  Treatment: 8 oz juice/soda  Symptoms: None  Follow-up CBG: [id number] CBG Result:89  Possible Reasons for Event: Unknown    Adam Howell

## 2022-03-25 NOTE — Progress Notes (Signed)
Physical Therapy Session Note  Patient Details  Name: Adam Howell MRN: [id number] Date of Birth: December 21, 1960  Today's Date: 03/25/2022 PT Individual Time: 1100-1208 PT Individual Time Calculation (min): 68 min   Short Term Goals: Week 1:  PT Short Term Goal 1 (Week 1): Pt will transfer squat pivot w/ mod to min A consistently PT Short Term Goal 2 (Week 1): Pt will transfer sit to stand w/ min A PT Short Term Goal 3 (Week 1): Pt will amb x 10' w/ min A and RW. PT Short Term Goal 4 (Week 1): Pt will negotiate w/c x 100' w/ supervision  Skilled Therapeutic Interventions/Progress Updates: Pt presented in  bed sleeping but easily aroused and agreeable to therapy. Pt c/o pain 8/10, premedicated with rest breaks provided as needed during session. Pt performed bed mobility with CGA and increased time with use of bed features. Performed lateral scoot transfer to w/c with CGA and increased time. Pt propelled >159ft with supervision to ortho gym and performed lateral scoot transfer in same manner to high/low mat. Pt was able to lift arm rest and remove leg rest with supervision. Pt performed Sit to stand x 5 from slightly elevated mat (to allow hips at 50/50) for LE strengthening and standing balance. Pt also participated in standing tolerance activity reaching and placing squigz off/on mirror incorporating standing tolerance, and moderate reaching challenges fro dynamic balance. Due to increased pain in residual limb participated in sitting balance/UE activity doing ball taps 2 x 25 with 5lb weighted dowel. Performed lateral scoot transfer to w/c with CGA. Pt transported to main therapy gym and performed Sit to stand in parallel bars with CGA. In standing pt performed therx with residula limb hip abd/add x20 then hip extension x20 with PTA providing tactile cues for improved erect posture and greater efficacy of glute activation. Pt then participated in ambulation in parallel bars 6 ft forward then ~73ft  backwards before requiring seated rest. Pt noted to have limited triceps activation when extending elbows to provide adequate L foot clearance despite bars being raised. After extended seated rest PTA elevated bars more and pt was able to hop forward x 38ft with mildly improved clearance. Pt transported back to room at end of session and was able to set up w/c with supervision for preparation for transfer to bed. Performed lateral scoot with verbal cues for improved head hips relationship with pt able to demosntrate fair hip clearance on this attempt. Pt then performed lateral scoots to [facility] then transferred to supine with use of bed rail. Pt repositioned to comfort and left with bed alarm on, call bell within reach and needs met.      Therapy Documentation Precautions:  Precautions Precautions: Fall Precaution Comments: s/p R AKA Restrictions Weight Bearing Restrictions: Yes RLE Weight Bearing: Non weight bearing General:   Vital Signs:   Pain: Pain Assessment Pain Scale: 0-10 Pain Score: 9  Pain Type: Surgical pain Pain Location: Leg Pain Orientation: Right Pain Descriptors / Indicators: Aching Pain Onset: On-going Pain Intervention(s): RN made aware Mobility:   Locomotion :    Trunk/Postural Assessment :    Balance:   Exercises:   Other Treatments:      Therapy/Group: Individual Therapy  Adam Howell 03/25/2022, 12:26 PM

## 2022-03-25 NOTE — Progress Notes (Signed)
Occupational Therapy Session Note  Patient Details  Name: Adam Howell MRN: [id number] Date of Birth: Oct 19, 1960  Today's Date: 03/25/2022 OT Individual Time: 4944-9675 OT Individual Time Calculation (min): 58 min    Short Term Goals: Week 1:  OT Short Term Goal 1 (Week 1): Pt will complete STS with MOD A in prep for clothing managment OT Short Term Goal 2 (Week 1): Pt will complete 2/3 steps of toileting OT Short Term Goal 3 (Week 1): Pt will transfer to toilet with MIN A and LRAD OT Short Term Goal 4 (Week 1): Pt will initiate shower level bathing training with no more than min A  Skilled Therapeutic Interventions/Progress Updates:     Pt received in bed with 8 out of 10 pain in R residual limb. RN alerted to need for meds and rest/repositioning provided for pain relief  ADL: Used stedy for effiiency of shower transfer after OT self feeds sitting unsupported at EOB without LE support with set up while OT gathers occlusives to cover R residual limb. Pt showers seated using lateral leans to wash buttocks. LHSS used for washing back and L foot. Replaced protective pad on buttocks. Dressing UB with set up and LB with supervision using lateral leans to advance pants pasthips at EOB. Pt unable to don shrinker by himself but did assist OT with donning. Pt declines pulling second layer over. Discussed completing after next pain medication administration.   Pt left at end of session in bed with exit alarm on, call light in reach and all needs met   Therapy Documentation Precautions:  Precautions Precautions: Fall Precaution Comments: s/p R AKA Restrictions Weight Bearing Restrictions: Yes RLE Weight Bearing: Non weight bearing General:    Therapy/Group: Individual Therapy  [name] 03/25/2022, 6:50 AM

## 2022-03-26 DIAGNOSIS — E1169 Type 2 diabetes mellitus with other specified complication: Secondary | ICD-10-CM | POA: Diagnosis not present

## 2022-03-26 DIAGNOSIS — S78111A Complete traumatic amputation at level between right hip and knee, initial encounter: Secondary | ICD-10-CM | POA: Diagnosis not present

## 2022-03-26 DIAGNOSIS — I739 Peripheral vascular disease, unspecified: Secondary | ICD-10-CM | POA: Diagnosis not present

## 2022-03-26 DIAGNOSIS — N186 End stage renal disease: Secondary | ICD-10-CM | POA: Diagnosis not present

## 2022-03-26 LAB — GLUCOSE, CAPILLARY
Glucose-Capillary: 86 mg/dL (ref 70–99)
Glucose-Capillary: 91 mg/dL (ref 70–99)
Glucose-Capillary: 97 mg/dL (ref 70–99)
Glucose-Capillary: 123 mg/dL — ABNORMAL HIGH (ref 70–99)

## 2022-03-26 MED ORDER — CHLORHEXIDINE GLUCONATE CLOTH 2 % EX PADS
6.0000 | MEDICATED_PAD | Freq: Every day | CUTANEOUS | Status: DC
  Administered 2022-03-27 – 2022-03-28 (×2): 6 via TOPICAL

## 2022-03-26 NOTE — Progress Notes (Signed)
Physical Therapy Weekly Progress Note  Patient Details  Name: Adam Howell MRN: [id number] Date of Birth: 1960/05/20  Beginning of progress report period: March 20, 2022 End of progress report period: March 26, 2022  Today's Date: 03/26/2022 PT Individual Time: 1030-1100 PT Individual Time Calculation (min): 30 min   Patient has met 3 of 4 short term goals.  Patient with steady progress this week, limited by pain and generalized deconditioning. Patient currently performs bed mobility with supervision, lateral scoot transfers with CGA, sit to stand with min A in // bars and gait up to 6 feet in the // bars (limited by fatigue and reduced foot clearance). Patient's family will participated in scheduled hands on training closer to d/c.   Patient continues to demonstrate the following deficits muscle weakness and muscle joint tightness, decreased cardiorespiratoy endurance, and decreased sitting balance, decreased standing balance, decreased postural control, and decreased balance strategies and therefore will continue to benefit from skilled PT intervention to increase functional independence with mobility.  Patient progressing toward long term goals..  Continue plan of care.  PT Short Term Goals Week 1:  PT Short Term Goal 1 (Week 1): Pt will transfer squat pivot w/ mod to min A consistently PT Short Term Goal 1 - Progress (Week 1): Met PT Short Term Goal 2 (Week 1): Pt will transfer sit to stand w/ min A PT Short Term Goal 2 - Progress (Week 1): Met PT Short Term Goal 3 (Week 1): Pt will amb x 10' w/ min A and RW. PT Short Term Goal 3 - Progress (Week 1): Progressing toward goal PT Short Term Goal 4 (Week 1): Pt will negotiate w/c x 100' w/ supervision PT Short Term Goal 4 - Progress (Week 1): Met Week 2:  PT Short Term Goal 1 (Week 2): STG=LTG due to ELOS.  Skilled Therapeutic Interventions/Progress Updates:     Patient in bed upon PT arrival. Patient alert and agreeable to PT  session. Patient reported 9/10 R residual limb pain during session, RN made aware, reports patient was pre-medicated prior to session. PT provided repositioning, rest breaks, and distraction as pain interventions throughout session. Patient reports his pain has remained elevated between an 8-910 and interrupts his sleep at night, RN made aware.   Patient requested bed level exercises due to elevated pain levels following earlier therapy sessions.   Therapeutic Exercise: Patient performed the following exercises with verbal and tactile cues for proper technique. -isometric R hip extension in supine 2x10 with 5 sec hold -R hip abd/add in supine 2x10 -R hip flexion in supine 2x10 -B glute sets with 5 sec hold -side-lying hip abduction 2x10 -bridging on L 2x5  Patient in bed at end of session with breaks locked, bed alarm set, and all needs within reach.   Therapy Documentation Precautions:  Precautions Precautions: Fall Precaution Comments: s/p R AKA Restrictions Weight Bearing Restrictions: Yes RLE Weight Bearing: Non weight bearing   Therapy/Group: Individual Therapy  Mima Cranmore L Lynasia Meloche PT, DPT, NCS, CBIS  03/26/2022, 12:49 PM

## 2022-03-26 NOTE — Progress Notes (Signed)
[name], male   DOB: 1961/01/26, 62 y.o.   MRN: [id number]  SW left message for [place] [phone]) to discuss signing up pt for transportation services. SW waiting on follow-up.   Loralee Pacas, MSW, [place] Office: [phone] Cell: [phone] Fax: [phone]

## 2022-03-26 NOTE — Progress Notes (Addendum)
Physical Therapy Session Note  Patient Details  Name: Adam Howell MRN: [id number] Date of Birth: 19-Feb-1961  Today's Date: 03/26/2022 PT Individual Time: 1027-253 and 6644-0347 PT Individual Time Calculation (min): 57 min and 73 min  Short Term Goals: Week 1:  PT Short Term Goal 1 (Week 1): Pt will transfer squat pivot w/ mod to min A consistently PT Short Term Goal 2 (Week 1): Pt will transfer sit to stand w/ min A PT Short Term Goal 3 (Week 1): Pt will amb x 10' w/ min A and RW. PT Short Term Goal 4 (Week 1): Pt will negotiate w/c x 100' w/ supervision  Skilled Therapeutic Interventions/Progress Updates:     1st Session: Pt received seated on edge of bed with RN present. Reports 8/10 pain in R residual limb. PT provides rest breaks as needed to manage pain. PT notes some sanguinous drainage on bed linens. RN notified. Pt performs squat pivot from bed to WC on L side with minA to facilitate anterior weight shift, and cues for positioning and sequencing. Pt self propels WC x200' to gym with bilateral upper extremities, with cues for increased shoulder extension to improve power on rim pushes. Pt then performs standing activities in parallel bars for mobility training and strengthening. Sit to stand multiple times with cues for hand placement and body mechanics. In standing, pt performs 3x20 consecutive heel raises with cues for optimal mechanics, with seated rest breaks in between bouts. In standing, pt then performs alternating arm raises to challenge balance with single upper extremity support. Pt completes x30 total with close supervision and no noted LOBs. Pt self propels WC back to room for endurance and mobility training. Left seated in WC with all needs within reach.  2nd Session: Pt received seated in WC asleep. Easily awakens to verbal stimuli and agrees to therapy. Reports pain in R residual limb. Number not provided. PT provides rest breaks and repositioning to manage pain. WC  transport to gym for time management. Pt performs squat pivot transfer from WC to Nustep on L with minA and cues for hand placement, body mechanics, and sequencing. Pt completes Nustep for endurance training. Pt completes x12:00 at workload of 5 with average steps per minute >45. PT provides cues for foot and hand placement and completing full available ROM.   PT asks pt if he is willing to attempt ambulation at this time and pt declines, saying he is too tired today. Pt is agreeable to performing supine therex. MinA for squat pivot to mat table and sit to supine with cues for positioning. Pt completes following: 1x15 glute sets, hip ab/adduction, SLRs, L lower extremity heel slides, and 3x10 single leg bridges with L lower extremity. Supine to sit with minA and cues for sequencing and body mechanics. Squat pivot back to [facility] with cues for hand placement and anterior weight shift. Pt left seated with all needs within reach.   Therapy Documentation Precautions:  Precautions Precautions: Fall Precaution Comments: s/p R AKA Restrictions Weight Bearing Restrictions: Yes RLE Weight Bearing: Non weight bearing   Therapy/Group: Individual Therapy  [name] Coons, PT, DPT 03/26/2022, 5:27 PM

## 2022-03-26 NOTE — Progress Notes (Signed)
PROGRESS NOTE   Subjective/Complaints: Up at eob. No new issues. Pain controlled  ROS: Patient denies fever, rash, sore throat, blurred vision, dizziness, nausea, vomiting, diarrhea, cough, shortness of breath or chest pain, joint or back/neck pain, headache, or mood change.     Objective:   No results found. Recent Labs    03/23/22 1530 03/25/22 1416  WBC 9.0 6.5  HGB 7.3* 7.4*  HCT 22.2* 23.0*  PLT 295 300   Recent Labs    03/23/22 1530 03/25/22 1932  NA 133* 134*  K 4.7 3.6  CL 93* 95*  CO2 27 29  GLUCOSE 97 89  BUN 42* 17  CREATININE 10.02* 5.12*  CALCIUM 8.1* 7.9*    Intake/Output Summary (Last 24 hours) at 03/26/2022 1002 Last data filed at 03/26/2022 0852 Gross per 24 hour  Intake 697 ml  Output 1030 ml  Net -333 ml        Physical Exam: Vital Signs Blood pressure 119/63, pulse 67, temperature 98.7 F (37.1 C), temperature source Oral, resp. rate 18, height 6\' 3"  (1.905 m), weight 105 kg, SpO2 98 %.  Constitutional: No distress . Vital signs reviewed. HEENT: NCAT, EOMI, oral membranes moist Neck: supple Cardiovascular: RRR without murmur. No JVD    Respiratory/Chest: CTA Bilaterally without wheezes or rales. Normal effort    GI/Abdomen: BS +, non-tender, non-distended Ext: no clubbing, cyanosis,   Psych: pleasant and cooperative  Skin: AVF LUE, right AKA incision CDI with staples.  Neuro:  Alert and oriented x 3. Normal insight and awareness. Intact Memory. Normal language and speech. Cranial nerve exam unremarkable  Musculoskeletal: right residual limb with decreased swelling. Shrinker fitting appropriately, remains tender to touch    Assessment/Plan: 1. Functional deficits which require 3+ hours per day of interdisciplinary therapy in a comprehensive inpatient rehab setting. Physiatrist is providing close team supervision and 24 hour management of active medical problems listed  below. Physiatrist and rehab team continue to assess barriers to discharge/monitor patient progress toward functional and medical goals  Care Tool:  Bathing    Body parts bathed by patient: Right arm, Left arm, Chest, Abdomen, Front perineal area, Left upper leg, Face   Body parts bathed by helper: Left lower leg Body parts n/a: Right lower leg   Bathing assist Assist Level: Minimal Assistance - Patient > 75%     Upper Body Dressing/Undressing Upper body dressing   What is the patient wearing?: Pull over shirt    Upper body assist Assist Level: Set up assist    Lower Body Dressing/Undressing Lower body dressing      What is the patient wearing?: Pants     Lower body assist Assist for lower body dressing: Moderate Assistance - Patient 50 - 74%     Toileting Toileting    Toileting assist Assist for toileting: Maximal Assistance - Patient 25 - 49%     Transfers Chair/bed transfer  Transfers assist     Chair/bed transfer assist level: Moderate Assistance - Patient 50 - 74%     Locomotion Ambulation   Ambulation assist      Assist level: Moderate Assistance - Patient 50 - 74% Assistive device: Walker-rolling Max distance:  8   Walk 10 feet activity   Assist  Walk 10 feet activity did not occur: Safety/medical concerns        Walk 50 feet activity   Assist Walk 50 feet with 2 turns activity did not occur: Safety/medical concerns         Walk 150 feet activity   Assist Walk 150 feet activity did not occur: Safety/medical concerns         Walk 10 feet on uneven surface  activity   Assist Walk 10 feet on uneven surfaces activity did not occur: Safety/medical concerns         Wheelchair     Assist Is the patient using a wheelchair?: Yes Type of Wheelchair: Manual    Wheelchair assist level: Minimal Assistance - Patient > 75% Max wheelchair distance: 130    Wheelchair 50 feet with 2 turns activity    Assist         Assist Level: Minimal Assistance - Patient > 75%   Wheelchair 150 feet activity     Assist      Assist Level: Minimal Assistance - Patient > 75%   Blood pressure 119/63, pulse 67, temperature 98.7 F (37.1 C), temperature source Oral, resp. rate 18, height 6\' 3"  (1.905 m), weight 105 kg, SpO2 98 %.  Medical Problem List and Plan: 1. Functional deficits secondary to R AKA in setting of non healing wound on RLE             -patient may  shower if covers R AKA incision             -ELOS/Goals: 18-21 days min A to supervision at w/c level  -Continue CIR therapies including PT, OT    2.  Antithrombotics: -DVT/anticoagulation:  Pharmaceutical: Eliquis 2.5mg  BID             -antiplatelet therapy: N/A 3. Pain Management:  Tylenol PRN, Oxycodone PRN.               -monitor for sedative SE. 4. Mood/Behavior/Sleep: LCSW to follow for evaluation and support.             -melatonin 5mg  prn for insomnia.              -antipsychotic agents: N/A 5. Neuropsych/cognition: This patient is capable of making decisions on his own behalf. 6. Skin/Wound Care: Monitor wound for healing. R AKA- ordered shrinker- con't staples- healing well- ordered ACE wrap for now- Eucerin ordered for dry/scaly L foot 7. Fluids/Electrolytes/Nutrition: Strict I/O w/1200 cc FR/Day  -labs with HD 8. T2DM: Hgb A1C-6.1 and diet controlled             -monitor BS ac/hs and use SSI for elevated BS. - extra sensitve SSI  -03/26/22 CBGs well controlled, changed to qd only   CBG (last 3)  Recent Labs    03/25/22 1246 03/25/22 2103 03/26/22 0609  GLUCAP 89 112* 86    9. ESRD: HD MWF at the end of the day to help with tolerance of activity             -daily weights w/FR and renal diet    Filed Weights   03/25/22 1331 03/25/22 1740 03/26/22 0500  Weight: 103.2 kg 102.9 kg 105 kg    10. OSA: continue BIPAP---pt refused. Says he doesn't use at home 11. Acute on anemia of chronic disease:              -1/9 hgb 7.3     -  aranesp and Fe infusions per nephrology  -asymptomatic 12. Chronic systolic CHF: Daily wt S/2876 cc FR/day. Continue Bumex 1mg  BID and Crestor 10mg  QD.  -Fluid status/weights managed with HD as above 13. Metabolic encephalopathy: Encourage CPAP use             -limit neuro sedating medications.  14. CAF: Monitor HR TID--continue Amiodarone 200mg  BID.  15. Diarrhea: Related to milk products and has resolved. 16. VRE contact precautions - off of contact precautions given wound removal/AKA.     LOS: 7 days A FACE TO FACE EVALUATION WAS PERFORMED  Meredith Staggers 03/26/2022, 10:02 AM   ,

## 2022-03-26 NOTE — Consult Note (Signed)
[facility] Nurse Consult Note: Reason for Consult: MASD buttocks  Wound type: MASD buttocks  Measurement: L buttock 4 cms x 3 cms, R buttock 4 cms x 3 cms; gluteal crease 4 cms x 1 cm  Wound bed: red, moist  Drainage (amount, consistency, odor) scant serosanguineous  Periwound: intact  Dressing procedure/placement/frequency: Cleanse with soap and water, pat dry, place Xeroform gauze Kellie Simmering [phone]) to open areas.  Cover with foam dressing.    Discussed POC with patient and bedside nurse.  WOC will not follow at this time.  Re-consult if needed.   Thank you,   Skylee Baird MSN, RN-BC, [employer]

## 2022-03-26 NOTE — Progress Notes (Signed)
Occupational Therapy Session Note  Patient Details  Name: Adam Howell MRN: [id number] Date of Birth: 12/27/1960  Today's Date: 03/26/2022 OT Individual Time: 1300-1345 OT Individual Time Calculation (min): 45 min    Short Term Goals: Week 1:  OT Short Term Goal 1 (Week 1): Pt will complete STS with MOD A in prep for clothing managment OT Short Term Goal 2 (Week 1): Pt will complete 2/3 steps of toileting OT Short Term Goal 3 (Week 1): Pt will transfer to toilet with MIN A and LRAD OT Short Term Goal 4 (Week 1): Pt will initiate shower level bathing training with no more than min A  Skilled Therapeutic Interventions/Progress Updates:     Pt received in bed with 8 out of 10 pain in R residual limb.  ADL: Pt completes TTB transfer via squat pivot with CGA. Edu re TTB sequence, curtain management, and sticker strips for safety in tub and outside of shower.   Pt completes kitchen search into various height cabinets/appliaces in prep for IADL retraining from w/c level with mod cuing for safety/positioning throughout environment. Pt educated to improve reach/safety and transportation of items with education on energy conservation techniques throughout activity   Therapeutic activity Pt completes bean bag toss in standing position with RW for balance and CGA-MOD A for 2 LOB when reaching with RUE. Activity performed to improve dynamic balance and functional reach in min-mod ranges outside BOS in prep for BADLs/IADLs.   Pt left at end of session in bed with exit alarm on, call light in reach and all needs met   Therapy Documentation Precautions:  Precautions Precautions: Fall Precaution Comments: s/p R AKA Restrictions Weight Bearing Restrictions: Yes RLE Weight Bearing: Non weight bearing General:    Therapy/Group: Individual Therapy  [name] 03/26/2022, 6:53 AM

## 2022-03-27 LAB — GLUCOSE, CAPILLARY
Glucose-Capillary: 76 mg/dL (ref 70–99)
Glucose-Capillary: 105 mg/dL — ABNORMAL HIGH (ref 70–99)
Glucose-Capillary: 84 mg/dL (ref 70–99)
Glucose-Capillary: 109 mg/dL — ABNORMAL HIGH (ref 70–99)

## 2022-03-27 MED ORDER — MEDIHONEY WOUND/BURN DRESSING EX PSTE
1.0000 | PASTE | Freq: Every day | CUTANEOUS | Status: DC
  Administered 2022-03-27 – 2022-04-02 (×6): 1 via TOPICAL
  Filled 2022-03-27: qty 44

## 2022-03-27 MED ORDER — HEPARIN SODIUM (PORCINE) 1000 UNIT/ML IJ SOLN
INTRAMUSCULAR | Status: AC
  Administered 2022-03-27: 1000 [IU]
  Filled 2022-03-27: qty 5

## 2022-03-27 NOTE — Plan of Care (Signed)
Wound Plan   Braden Score: 16  Sensory: 3  Moisture: 4  Activity: 2  Mobility: 3  Nutrition: 3  Friction: 1   Wounds present:   Pressure Injury 03/19/22 Right Ischial tuberosity Stage II  (POA) Pressure Injury 03/19/22 Left Ischial tuberosity Stage II  (POA) MASD     03/26/22   Interventions:   Air Mattress Eucerin Cream to Left foot dry skin   BID Medihoney to pressure injuries  Daily Foam Dressing to cover pressure injury Multivitamin     Daily Roho cushion WOC consult  Attendees/ Contributors:  Tacy Learn, RN Drucilla Schmidt, RN Estill Bamberg, RN Jeanie Cooks, RN Apolinar Junes, PT Eunice Blase, OT

## 2022-03-27 NOTE — Progress Notes (Signed)
PROGRESS NOTE   Subjective/Complaints: Pt without complaints. Denies pain. Nurse discovered changes to sacral wounds. Pt denies any irritation or pain in area. He does have intact sensation there.  ROS: Patient denies fever, rash, sore throat, blurred vision, dizziness, nausea, vomiting, diarrhea, cough, shortness of breath or chest pain,   headache, or mood change.    Objective:   No results found. Recent Labs    03/25/22 1416  WBC 6.5  HGB 7.4*  HCT 23.0*  PLT 300   Recent Labs    03/25/22 1932  NA 134*  K 3.6  CL 95*  CO2 29  GLUCOSE 89  BUN 17  CREATININE 5.12*  CALCIUM 7.9*    Intake/Output Summary (Last 24 hours) at 03/27/2022 1231 Last data filed at 03/27/2022 0723 Gross per 24 hour  Intake 580.36 ml  Output --  Net 580.36 ml        Physical Exam: Vital Signs Blood pressure 131/68, pulse 66, temperature 98.4 F (36.9 C), temperature source Oral, resp. rate 16, height 6\' 3"  (1.905 m), weight 105.7 kg, SpO2 98 %.  Constitutional: No distress . Vital signs reviewed. HEENT: NCAT, EOMI, oral membranes moist Neck: supple Cardiovascular: RRR without murmur. No JVD    Respiratory/Chest: CTA Bilaterally without wheezes or rales. Normal effort    GI/Abdomen: BS +, non-tender, non-distended Ext: no clubbing, cyanosis,   Psych: pleasant and cooperative  Skin: AVF LUE, right AKA incision CDI with staples.   Sacral wounds below: 03/19/22   03/27/22       Neuro:  Alert and oriented x 3. Normal insight and awareness. Intact Memory. Normal language and speech. Cranial nerve exam unremarkable  Musculoskeletal: right residual limb with decreasing swelling. Shrinker fitting appropriately, much less tender with touch   Assessment/Plan: 1. Functional deficits which require 3+ hours per day of interdisciplinary therapy in a comprehensive inpatient rehab setting. Physiatrist is providing close team  supervision and 24 hour management of active medical problems listed below. Physiatrist and rehab team continue to assess barriers to discharge/monitor patient progress toward functional and medical goals  Care Tool:  Bathing    Body parts bathed by patient: Right arm, Left arm, Chest, Abdomen, Front perineal area, Left upper leg, Face   Body parts bathed by helper: Left lower leg Body parts n/a: Right lower leg   Bathing assist Assist Level: Minimal Assistance - Patient > 75%     Upper Body Dressing/Undressing Upper body dressing   What is the patient wearing?: Pull over shirt    Upper body assist Assist Level: Set up assist    Lower Body Dressing/Undressing Lower body dressing      What is the patient wearing?: Pants     Lower body assist Assist for lower body dressing: Moderate Assistance - Patient 50 - 74%     Toileting Toileting    Toileting assist Assist for toileting: Maximal Assistance - Patient 25 - 49%     Transfers Chair/bed transfer  Transfers assist     Chair/bed transfer assist level: Moderate Assistance - Patient 50 - 74%     Locomotion Ambulation   Ambulation assist      Assist level: Moderate  Assistance - Patient 50 - 74% Assistive device: Walker-rolling Max distance: 8   Walk 10 feet activity   Assist  Walk 10 feet activity did not occur: Safety/medical concerns        Walk 50 feet activity   Assist Walk 50 feet with 2 turns activity did not occur: Safety/medical concerns         Walk 150 feet activity   Assist Walk 150 feet activity did not occur: Safety/medical concerns         Walk 10 feet on uneven surface  activity   Assist Walk 10 feet on uneven surfaces activity did not occur: Safety/medical concerns         Wheelchair     Assist Is the patient using a wheelchair?: Yes Type of Wheelchair: Manual    Wheelchair assist level: Supervision/Verbal cueing Max wheelchair distance: 150     Wheelchair 50 feet with 2 turns activity    Assist        Assist Level: Supervision/Verbal cueing   Wheelchair 150 feet activity     Assist      Assist Level: Supervision/Verbal cueing   Blood pressure 131/68, pulse 66, temperature 98.4 F (36.9 C), temperature source Oral, resp. rate 16, height 6\' 3"  (1.905 m), weight 105.7 kg, SpO2 98 %.  Medical Problem List and Plan: 1. Functional deficits secondary to R AKA in setting of non healing wound on RLE             -patient may  shower if covers R AKA incision             -ELOS/Goals: 18-21 days min A to supervision at w/c level  -Continue CIR therapies including PT, OT   2.  Antithrombotics: -DVT/anticoagulation:  Pharmaceutical: Eliquis 2.5mg  BID             -antiplatelet therapy: N/A 3. Pain Management:  Tylenol PRN, Oxycodone PRN.               -monitor for sedative SE. 4. Mood/Behavior/Sleep: LCSW to follow for evaluation and support.             -melatonin 5mg  prn for insomnia.              -antipsychotic agents: N/A 5. Neuropsych/cognition: This patient is capable of making decisions on his own behalf. 6. Skin/Wound Care: Right AKA healing nicely  -sacral wounds--appears to be evolution of areas initially thought to be skin tears. In hindsight, you can see non-viable superficial layer on initial photos which has since come off.    -medihoney small fibronecrotic areas, foam dressing daily   -pressure relief, nutrition 7. Fluids/Electrolytes/Nutrition: Strict I/O w/1200 cc FR/Day  -labs with HD 8. T2DM: Hgb A1C-6.1 and diet controlled             -monitor BS ac/hs and use SSI for elevated BS. - extra sensitve SSI  -03/27/22 CBGs well controlled-- qd only   CBG (last 3)  Recent Labs    03/26/22 2144 03/27/22 0610 03/27/22 1157  GLUCAP 123* 76 105*    9. ESRD: HD MWF at the end of the day to help with tolerance of activity             -daily weights w/FR and renal diet    Filed Weights   03/25/22 1740  03/26/22 0500 03/27/22 0354  Weight: 102.9 kg 105 kg 105.7 kg    10. OSA: continue BIPAP---pt refused. Says he doesn't use at home 11.  Acute on anemia of chronic disease:              -1/9 hgb 7.3    -aranesp and Fe infusions per nephrology  -asymptomatic 12. Chronic systolic CHF: Daily wt K/8138 cc FR/day. Continue Bumex 1mg  BID and Crestor 10mg  QD.  -Fluid status/weights managed with HD as above 13. Metabolic encephalopathy: Encourage CPAP use             -limit neuro sedating medications.  14. CAF: Monitor HR TID--continue Amiodarone 200mg  BID.  15. Diarrhea: Related to milk products and has resolved. 16. VRE contact precautions - off of contact precautions given wound removal/AKA.     LOS: 8 days A FACE TO FACE EVALUATION WAS PERFORMED  Meredith Staggers 03/27/2022, 12:31 PM   ,

## 2022-03-27 NOTE — Progress Notes (Signed)
Received patient in bed to unit.  Alert and oriented.  Informed consent signed and in chart.   Treatment initiated: 1412 Treatment completed: 1733  Patient tolerated well.  Transported back to the room  Alert, without acute distress.  Hand-off given to patient's nurse.   Access used: AVF Access issues: none  Total UF removed: 1.0L Medication(s) given: none Post HD VS: 130/70,61,100%,25,97.9 Post HD weight: 103.9kg   [facility]

## 2022-03-27 NOTE — Procedures (Signed)
   I was present at this dialysis session, have reviewed the session itself and made  appropriate changes Kelly Splinter MD [place] pager [phone]   03/27/2022, 4:50 PM

## 2022-03-27 NOTE — Progress Notes (Signed)
Physical Therapy Session Note  Patient Details  Name: Adam Howell MRN: [id number] Date of Birth: [birth year]  Today's Date: 03/27/2022 PT Individual Time: 1047-1200 PT Individual Time Calculation (min): 73 min   Short Term Goals: Week 1:  PT Short Term Goal 1 (Week 1): Pt will transfer squat pivot w/ mod to min A consistently PT Short Term Goal 1 - Progress (Week 1): Met PT Short Term Goal 2 (Week 1): Pt will transfer sit to stand w/ min A PT Short Term Goal 2 - Progress (Week 1): Met PT Short Term Goal 3 (Week 1): Pt will amb x 10' w/ min A and RW. PT Short Term Goal 3 - Progress (Week 1): Progressing toward goal PT Short Term Goal 4 (Week 1): Pt will negotiate w/c x 100' w/ supervision PT Short Term Goal 4 - Progress (Week 1): Met  Skilled Therapeutic Interventions/Progress Updates: Pt presents propped up on R elbow in bed and agreeable to therapy.  Pt w/ flat affect and stating pain of 8/10.  Pt completes sup. Sit transfer w/ supervision although does require CGA from flat mat table later in session.  Pt performed squat pivot transfer from slightly elevated bed > w/c w/ supervision only, although cues for L LE placement and advancement.  Pt negotiated w/c at least 150' w/ supervision and occasional cues for increased propulsion arm.  Pt performed multiple squat pivot transfers w/c <>Nu-step and mat table and mod A.  Pt performed Nu-step x 12' at level 5 w/o c/o.  Pt performed sit to stand in // bars w/ mod A.  Pt performed 3 x 10 abd, hip flexion, and extension in 3 separate standing trials.  Pt transferred to mat table and lying supine for stretch of R hip flexors.  Pt wheeled toward room w/ supervision x 75' before fatigues.  Pt required mod A for squat pivot transfer to bed .  Pt remains sitting at EOB w/ all needs in reach, bed alarm on.     Therapy Documentation Precautions:  Precautions Precautions: Fall Precaution Comments: s/p R AKA Restrictions Weight Bearing  Restrictions: Yes RLE Weight Bearing: Non weight bearing General:   Vital Signs:   Pain:8/10 Pain Assessment Pain Score: 3       Therapy/Group: Individual Therapy  Ladoris Gene 03/27/2022, 11:59 AM

## 2022-03-27 NOTE — Progress Notes (Signed)
Occupational Therapy Weekly Progress Note  Patient Details  Name: Adam Howell MRN: [id number] Date of Birth: January 29, 1961  Beginning of progress report period: March 20, 2022 End of progress report period: March 27, 2022  Today's Date: 03/27/2022 OT Individual Time: 4034-7425 OT Individual Time Calculation (min): 75 min   Today's Date: 03/27/2022 OT Individual Time: 9563-8756 OT Individual Time Calculation (min): 60 min   Patient has met 4 of 4 short term goals.  Pt has made excellent progress towards OT goals improving to overall MIN A for BADL at EOB/shower level. Pt continues to demo decreased strength, decreased activity tolerance and decreased balance impacting functional perforamnce and safety at home. Pt currently with barrier to DC as pt daughter has not secured housing yet and unsure of DC proposal.   Patient continues to demonstrate the following deficits: muscle weakness, decreased cardiorespiratoy endurance, and decreased sitting balance, decreased standing balance, decreased postural control, and decreased balance strategies and therefore will continue to benefit from skilled OT intervention to enhance overall performance with BADL, iADL, and Reduce care partner burden.  Patient progressing toward long term goals..  Continue plan of care.  OT Short Term Goals Week 1:  OT Short Term Goal 1 (Week 1): Pt will complete STS with MOD A in prep for clothing managment OT Short Term Goal 1 - Progress (Week 1): Met OT Short Term Goal 2 (Week 1): Pt will complete 2/3 steps of toileting OT Short Term Goal 2 - Progress (Week 1): Met OT Short Term Goal 3 (Week 1): Pt will transfer to toilet with MIN A and LRAD OT Short Term Goal 3 - Progress (Week 1): Met OT Short Term Goal 4 (Week 1): Pt will initiate shower level bathing training with no more than min A OT Short Term Goal 4 - Progress (Week 1): Met Week 2:  OT Short Term Goal 1 (Week 2): STG=LTG d/t ELOS  Skilled Therapeutic  Interventions/Progress Updates:    Session 1: Pt received in bed with 8 out of 10 pain in R residual limb. RN alerted to provide medication and shower provided for pain relief  ADL: Pt completes ADL at overall min-CGA Level. Skilled interventions include:  Covering incision with oclusives to keep dry in shower Eduation on doffing/donning techniques for shrinker Increased time and question cuing for w/c parts management during CGA-MIN squat pivot transfers Set up of LHSS to wash feet seated Pt ale to recall lateral leans to wash buttocks (replaced protetive pad for skin tear after shower) Dressing EOB for lateral leans/donning pants  Pt needs cuing for pacing d/t slow nature Discussed with RN skin breakdown on buttocks as well as drainaige from residual limb.  Pt left at end of session in bed with exit alarm on, call light in reach and all needs met   Session 2:  Pt received in bed with 8 out of 10 pain in R residual limb. Rest and repositiong provided for pain relief  ADL: Pt completes simulated toileting with CGA for stand pivot transfers with RW and min cuing for hand placement. Pt able to manage pants in standing with CGA using 1UE on walker and LUE cor the clothing management. Pt will practice 1x moe with therapy  before changing safety plan  Laundry in standing for moving clothing from wahsing machine to dryer with CGA and pt placing forearm on dryer using RUE to tranfer to top of dryer  Therapeutic exercise Pt completes medicine ball circuit with 2Kg medball:  PNF diagonals  in B directions Overhead press Overhead tricep extension  2x20 on kinetron at 40 cm/sec withLLE only for glute nad LE strengthening.   Pt left at end of session in bed with exit alarm on, call light in reach and all needs met   Therapy Documentation Precautions:  Precautions Precautions: Fall Precaution Comments: s/p R AKA Restrictions Weight Bearing Restrictions: Yes RLE Weight Bearing: Non  weight bearing General:    Therapy/Group: Individual Therapy  [name] 03/27/2022, 6:53 AM

## 2022-03-27 NOTE — Progress Notes (Signed)
Patient returned from dialysis at [facility]. Patient up in [facility] eating dinner. BS and VS obtained and charted.

## 2022-03-28 LAB — GLUCOSE, CAPILLARY
Glucose-Capillary: 81 mg/dL (ref 70–99)
Glucose-Capillary: 87 mg/dL (ref 70–99)
Glucose-Capillary: 113 mg/dL — ABNORMAL HIGH (ref 70–99)

## 2022-03-28 NOTE — Progress Notes (Signed)
[facility] Progress Note  Subjective: seen in room, doing well, no c/o. For dc in about 7 days  Vitals:   03/28/22 0505 03/28/22 1257 03/28/22 1334 03/28/22 1947  BP: (!) 152/87 (!) 117/58 (!) 111/54 135/65  Pulse: 73 68 64 (!) 58  Resp: 16 18 19 18   Temp: 99 F (37.2 C) 99 F (37.2 C) 98.7 F (37.1 C) 98.2 F (36.8 C)  TempSrc: Oral Oral Oral Oral  SpO2: 100% 96% 96% 100%  Weight: 104.7 kg     Height:        Exam: General adult male in bed in no acute distress Lungs clear to auscultation bilat Heart S1S2 no rub Abdomen soft nontender Extremities no edema, Rt AKA Access LUE AVF +bruit; area of skin discoloration, per pt we are sticking away from this    OP HD: MWF [facility]  4.5h  113.6kg   450/800   2/2.5 bath   AVF 14ga x2  Hep 7500+ 3051midrun - mircera 100 q4 - sensipar 60 tiw - calcitriol 1.5 mcg po tiw   Assessment/ Plan: Debility - on CIR SP R AKA  ESRD - on HD MWF. Next HD Monday.  HTN - stable, normal to slightly high Vol - have lowered edw w/ AKA and vol removal is 8kg under now. Euvolemic, low UF goal w/ next HD.  Anemia esrd - Hb 7-8 range, getting esa here w/ darbe 100 ug w Friday. Tsat was 18% and IV Fe is ordered.  MBD ckd - CCa in range, phos is up a bit. Cont daily sensipar, tiw po vdra and renvela 3 ac tid as bidner.      Rob Presly Steinruck 03/28/2022, 8:08 PM   Recent Labs  Lab 03/23/22 1530 03/25/22 1416 03/25/22 1932  HGB 7.3* 7.4*  --   ALBUMIN 2.3*  --  2.3*  CALCIUM 8.1*  --  7.9*  PHOS 4.1  --  2.4*  CREATININE 10.02*  --  5.12*  K 4.7  --  3.6    No results for input(s): "IRON", "TIBC", "FERRITIN" in the last 168 hours.  Inpatient medications:  amiodarone  200 mg Oral BID   apixaban  2.5 mg Oral BID   bumetanide  1 mg Oral BID   calcitRIOL  1.5 mcg Oral Q M,W,F-1800   cinacalcet  30 mg Oral Q breakfast   Darbepoetin Alfa  100 mcg Subcutaneous Q7 days   hydrocerin   Topical BID    leptospermum manuka honey  1 Application Topical Daily   multivitamin  1 tablet Oral QHS   rosuvastatin  10 mg Oral Daily   senna  2 tablet Oral QHS   sevelamer carbonate  1,600 mg Oral TID WC    ferric gluconate (FERRLECIT) IVPB 50 mL/hr at 03/26/22 1150   acetaminophen, bisacodyl, dextrose, diphenhydrAMINE, guaiFENesin-dextromethorphan, heparin, lidocaine-prilocaine, melatonin, milk and molasses, oxyCODONE-acetaminophen, pentafluoroprop-tetrafluoroeth, polyethylene glycol, prochlorperazine **OR** prochlorperazine **OR** prochlorperazine, simethicone, sorbitol

## 2022-03-28 NOTE — Progress Notes (Signed)
PROGRESS NOTE   Subjective/Complaints: Pt without complaints this morning, states pain is ok today. Not having much phantom pain. Slept ok. Had dialysis yesterday, did ok with that. LBM yesterday.   ROS: Patient denies fever, blurred vision, dizziness, abd pain, nausea, vomiting, diarrhea, cough, shortness of breath or chest pain, headache, or mood change.    Objective:   No results found. Recent Labs    03/25/22 1416  WBC 6.5  HGB 7.4*  HCT 23.0*  PLT 300    Recent Labs    03/25/22 1932  NA 134*  K 3.6  CL 95*  CO2 29  GLUCOSE 89  BUN 17  CREATININE 5.12*  CALCIUM 7.9*     Intake/Output Summary (Last 24 hours) at 03/28/2022 6803 Last data filed at 03/27/2022 1733 Gross per 24 hour  Intake 397 ml  Output 1000 ml  Net -603 ml      Pressure Injury 03/19/22 Ischial tuberosity Right Stage 2 -  Partial thickness loss of dermis presenting as a shallow open injury with a red, pink wound bed without slough. (Active)  03/19/22 1400  Location: Ischial tuberosity  Location Orientation: Right  Staging: Stage 2 -  Partial thickness loss of dermis presenting as a shallow open injury with a red, pink wound bed without slough.  Wound Description (Comments):   Present on Admission: Yes     Pressure Injury 03/19/22 Ischial tuberosity Left Stage 2 -  Partial thickness loss of dermis presenting as a shallow open injury with a red, pink wound bed without slough. (Active)  03/19/22 1400  Location: Ischial tuberosity  Location Orientation: Left  Staging: Stage 2 -  Partial thickness loss of dermis presenting as a shallow open injury with a red, pink wound bed without slough.  Wound Description (Comments):   Present on Admission: Yes    Physical Exam: Vital Signs Blood pressure (!) 152/87, pulse 73, temperature 99 F (37.2 C), temperature source Oral, resp. rate 16, height 6\' 3"  (1.905 m), weight 104.7 kg, SpO2 100  %.  Constitutional: No distress . Vital signs reviewed. HEENT: NCAT, EOMI, oral membranes moist Neck: supple Cardiovascular: RRR without murmur. No JVD    Respiratory/Chest: CTA Bilaterally without wheezes or rales. Normal effort    GI/Abdomen: BS +, non-tender, non-distended Ext: no clubbing, cyanosis,   Psych: pleasant and cooperative  Skin: AVF LUE, right AKA incision CDI with staples-- covered this morning with pant leg, no drainage noted.   Sacral wounds below: 03/19/22   03/27/22       Neuro:  Alert and oriented x 3. Normal insight and awareness. Intact Memory. Normal language and speech. Cranial nerve exam unremarkable  Musculoskeletal: right residual limb with decreasing swelling. Shrinker fitting appropriately, much less tender with touch   Assessment/Plan: 1. Functional deficits which require 3+ hours per day of interdisciplinary therapy in a comprehensive inpatient rehab setting. Physiatrist is providing close team supervision and 24 hour management of active medical problems listed below. Physiatrist and rehab team continue to assess barriers to discharge/monitor patient progress toward functional and medical goals  Care Tool:  Bathing    Body parts bathed by patient: Right arm, Left arm, Chest, Abdomen,  Front perineal area, Left upper leg, Face   Body parts bathed by helper: Left lower leg Body parts n/a: Right lower leg   Bathing assist Assist Level: Minimal Assistance - Patient > 75%     Upper Body Dressing/Undressing Upper body dressing   What is the patient wearing?: Pull over shirt    Upper body assist Assist Level: Set up assist    Lower Body Dressing/Undressing Lower body dressing      What is the patient wearing?: Pants     Lower body assist Assist for lower body dressing: Moderate Assistance - Patient 50 - 74%     Toileting Toileting    Toileting assist Assist for toileting: Maximal Assistance - Patient 25 - 49%      Transfers Chair/bed transfer  Transfers assist     Chair/bed transfer assist level: Moderate Assistance - Patient 50 - 74%     Locomotion Ambulation   Ambulation assist      Assist level: Moderate Assistance - Patient 50 - 74% Assistive device: Walker-rolling Max distance: 8   Walk 10 feet activity   Assist  Walk 10 feet activity did not occur: Safety/medical concerns        Walk 50 feet activity   Assist Walk 50 feet with 2 turns activity did not occur: Safety/medical concerns         Walk 150 feet activity   Assist Walk 150 feet activity did not occur: Safety/medical concerns         Walk 10 feet on uneven surface  activity   Assist Walk 10 feet on uneven surfaces activity did not occur: Safety/medical concerns         Wheelchair     Assist Is the patient using a wheelchair?: Yes Type of Wheelchair: Manual    Wheelchair assist level: Supervision/Verbal cueing Max wheelchair distance: 150    Wheelchair 50 feet with 2 turns activity    Assist        Assist Level: Supervision/Verbal cueing   Wheelchair 150 feet activity     Assist      Assist Level: Supervision/Verbal cueing   Blood pressure (!) 152/87, pulse 73, temperature 99 F (37.2 C), temperature source Oral, resp. rate 16, height 6\' 3"  (1.905 m), weight 104.7 kg, SpO2 100 %.  Medical Problem List and Plan: 1. Functional deficits secondary to R AKA in setting of non healing wound on RLE             -patient may  shower if covers R AKA incision             -ELOS/Goals: 18-21 days min A to supervision at w/c level  -Continue CIR therapies including PT, OT   2.  Antithrombotics: -DVT/anticoagulation:  Pharmaceutical: Eliquis 2.5mg  BID             -antiplatelet therapy: N/A 3. Pain Management:  Tylenol PRN, Oxycodone PRN.               -monitor for sedative SE. 4. Mood/Behavior/Sleep: LCSW to follow for evaluation and support.             -melatonin 5mg  prn  for insomnia.              -antipsychotic agents: N/A 5. Neuropsych/cognition: This patient is capable of making decisions on his own behalf. 6. Skin/Wound Care: Right AKA healing nicely -03/27/22 sacral wounds--appears to be evolution of areas initially thought to be skin tears. In hindsight, you can see non-viable  superficial layer on initial photos which has since come off.    -medihoney small fibronecrotic areas, foam dressing daily   -pressure relief, nutrition 7. Fluids/Electrolytes/Nutrition: Strict I/O w/1200 cc FR/Day  -labs with HD 8. T2DM: Hgb A1C-6.1 and diet controlled             -monitor BS ac/hs and use SSI for elevated BS. - extra sensitve SSI  -03/27/22 CBGs well controlled-- qd only  -03/28/22 CBGs well controlled, continue to monitor   CBG (last 3)  Recent Labs    03/27/22 1849 03/27/22 2117 03/28/22 0617  GLUCAP 84 109* 81     9. ESRD: HD MWF at the end of the day to help with tolerance of activity             -daily weights w/FR and renal diet  -03/28/22 stable, monitor Filed Weights   03/27/22 0354 03/27/22 1412 03/28/22 0505  Weight: 105.7 kg 104.7 kg 104.7 kg     10. OSA: continue BIPAP---pt refused. Says he doesn't use at home 11. Acute on anemia of chronic disease:              -03/28/22 hgb 7.4, stable  -aranesp and Fe infusions per nephrology  -asymptomatic, monitor 12. Chronic systolic CHF: Daily wt Z/3086 cc FR/day. Continue Bumex 1mg  BID and Crestor 10mg  QD.  -Fluid status/weights managed with HD as above 13. Metabolic encephalopathy: Encourage CPAP use             -limit neuro sedating medications.  14. CAF: Monitor HR TID--continue Amiodarone 200mg  BID.   -03/28/22 HR well controlled, continue to monitor 15. Diarrhea: Related to milk products and has resolved. 16. VRE contact precautions - off of contact precautions given wound removal/AKA.     LOS: 9 days A FACE TO [place] 03/28/2022, 7:05 AM    ,

## 2022-03-28 NOTE — Progress Notes (Signed)
Occupational Therapy Session Note  Patient Details  Name: Adam Howell MRN: [id number] Date of Birth: 09-16-1960  Today's Date: 03/28/2022 OT Individual Time: 1696-7893 OT Individual Time Calculation (min): 54 min    Short Term Goals: Week 1:  OT Short Term Goal 1 (Week 1): Pt will complete STS with MOD A in prep for clothing managment OT Short Term Goal 1 - Progress (Week 1): Met OT Short Term Goal 2 (Week 1): Pt will complete 2/3 steps of toileting OT Short Term Goal 2 - Progress (Week 1): Met OT Short Term Goal 3 (Week 1): Pt will transfer to toilet with MIN A and LRAD OT Short Term Goal 3 - Progress (Week 1): Met OT Short Term Goal 4 (Week 1): Pt will initiate shower level bathing training with no more than min A OT Short Term Goal 4 - Progress (Week 1): Met  Skilled Therapeutic Interventions/Progress Updates:     Pt received in bed with 7 out of 10 pain in R residual limb. Rest and repositiong provided for pain relief.    Therapeutic exercise Supine: 2x10 hip flex/ext Sidelying: 2x10 hip ab/adduciton Prone: 1x5 min stretch   2x10 hip extension  2x5 dolphin-scap press  Unable to do dolphin.baby cobra   Therapeutic activity Functional transfer trainign via SPT with RW with CGA and cuing for hand placement/reach back EOB<>w/c<>EOM  Pt left at end of session in bed with exit alarm on, call light in reach and all needs met   Therapy Documentation Precautions:  Precautions Precautions: Fall Precaution Comments: s/p R AKA Restrictions Weight Bearing Restrictions: Yes RLE Weight Bearing: Non weight bearing General:   Vital Signs:  Pain: Pain Assessment Pain Score: 3  ADL: ADL Eating: Set up Where Assessed-Eating: Bed level Grooming: Supervision/safety Where Assessed-Grooming: Sitting at sink Upper Body Bathing: Setup Where Assessed-Upper Body Bathing: Edge of bed Lower Body Bathing: Moderate assistance Where Assessed-Lower Body Bathing: Edge of  bed Upper Body Dressing: Supervision/safety Where Assessed-Upper Body Dressing: Edge of bed Lower Body Dressing: Moderate assistance Where Assessed-Lower Body Dressing: Edge of bed Toileting: Maximal assistance Where Assessed-Toileting: Bedside Commode Toilet Transfer: Moderate assistance Toilet Transfer Method: Squat pivot Toilet Transfer Equipment: Drop arm bedside commode Tub/Shower Transfer: Unable to assess Vision   Perception    Praxis   Balance   Exercises:   Other Treatments:     Therapy/Group: Individual Therapy  [name] 03/28/2022, 10:53 AM

## 2022-03-29 LAB — GLUCOSE, CAPILLARY: Glucose-Capillary: 94 mg/dL (ref 70–99)

## 2022-03-29 MED ORDER — CHLORHEXIDINE GLUCONATE CLOTH 2 % EX PADS
6.0000 | MEDICATED_PAD | Freq: Every day | CUTANEOUS | Status: DC
  Administered 2022-03-31 – 2022-04-04 (×2): 6 via TOPICAL

## 2022-03-29 NOTE — Progress Notes (Signed)
[facility] Progress Note   Subjective:    Seen and examined patient at bedside. No acute complaints. Next HD 03/30/22.  Objective Vitals:   03/28/22 1334 03/28/22 1947 03/29/22 0344 03/29/22 0500  BP: (!) 111/54 135/65 120/65   Pulse: 64 (!) 58 62   Resp: 19 18 18    Temp: 98.7 F (37.1 C) 98.2 F (36.8 C) 98.1 F (36.7 C)   TempSrc: Oral Oral Oral   SpO2: 96% 100% 99%   Weight:    104.7 kg  Height:       Physical Exam General adult male in bed in no acute distress Lungs clear to auscultation bilat Heart S1S2 no rub Abdomen soft nontender Extremities no edema, Rt AKA Access LUE AVF +bruit; area of skin discoloration, per pt we are sticking away from this  [facility] Weights   03/27/22 1412 03/28/22 0505 03/29/22 0500  Weight: 104.7 kg 104.7 kg 104.7 kg    Intake/Output Summary (Last 24 hours) at 03/29/2022 1203 Last data filed at 03/28/2022 1842 Gross per 24 hour  Intake 478 ml  Output --  Net 478 ml    Additional Objective Labs: Basic Metabolic Panel: Recent Labs  Lab 03/23/22 1530 03/25/22 1932  NA 133* 134*  K 4.7 3.6  CL 93* 95*  CO2 27 29  GLUCOSE 97 89  BUN 42* 17  CREATININE 10.02* 5.12*  CALCIUM 8.1* 7.9*  PHOS 4.1 2.4*   Liver Function Tests: Recent Labs  Lab 03/23/22 1530 03/25/22 1932  ALBUMIN 2.3* 2.3*   No results for input(s): "LIPASE", "AMYLASE" in the last 168 hours. CBC: Recent Labs  Lab 03/23/22 1530 03/25/22 1416  WBC 9.0 6.5  HGB 7.3* 7.4*  HCT 22.2* 23.0*  MCV 88.1 89.1  PLT 295 300   Blood Culture    Component Value Date/Time   SDES  03/07/2022 0950    WOUND Performed at [facility], [address] [place]el[place]hat [place], [place] [address]    [facility]  03/07/2022 0950    right lower extremity wound Performed at [facility], [address] [place], [place] [address]    CULT  03/07/2022 0950    FEW CITROBACTER KOSERI FEW ENTEROCOCCUS FAECIUM FEW PROTEUS MIRABILIS VANCOMYCIN RESISTANT  ENTEROCOCCUS ISOLATED NO ANAEROBES ISOLATED Performed at [facility], [place] [address] [place], [place] [address]    REPTSTATUS 03/12/2022 FINAL 03/07/2022 0950    Cardiac Enzymes: No results for input(s): "CKTOTAL", "CKMB", "CKMBINDEX", "TROPONINI" in the last 168 hours. CBG: Recent Labs  Lab 03/27/22 2117 03/28/22 0617 03/28/22 1141 03/28/22 2112 03/29/22 0457  GLUCAP 109* 81 87 113* 94   Iron Studies: No results for input(s): "IRON", "TIBC", "TRANSFERRIN", "FERRITIN" in the last 72 hours. Lab Results  Component Value Date   INR 1.2 03/05/2022   Studies/Results: No results found.  Medications:  ferric gluconate (FERRLECIT) IVPB 50 mL/hr at 03/26/22 1150    amiodarone  200 mg Oral BID   apixaban  2.5 mg Oral BID   bumetanide  1 mg Oral BID   calcitRIOL  1.5 mcg Oral Q M,W,F-1800   cinacalcet  30 mg Oral Q breakfast   Darbepoetin Alfa  100 mcg Subcutaneous Q7 days   hydrocerin   Topical BID   leptospermum manuka honey  1 Application Topical Daily   multivitamin  1 tablet Oral QHS   rosuvastatin  10 mg Oral Daily   senna  2 tablet Oral QHS   sevelamer carbonate  1,600 mg Oral TID  WC    Dialysis Orders: MWF [facility]  4.5h  113.6kg   450/800   2/2.5 bath   AVF 14ga x2  Hep 7500+ 3072midrun - mircera 100 q4 - sensipar 60 tiw - calcitriol 1.5 mcg po tiw  Assessment/Plan: Debility - on CIR SP R AKA  ESRD - on HD MWF. Next HD 03/30/22.  HTN - stable, normal to slightly high Vol - have lowered edw w/ AKA and vol removal is 8kg under now. Euvolemic, low UF goal w/ next HD. Lower EDW at discharge. Anemia esrd - Hb 7-8 range, getting esa here w/ darbe 100 ug w Friday. Tsat was 18% and IV Fe is ordered.  MBD ckd - CCa in range, phos is up a bit. Cont daily sensipar, tiw po vdra and renvela 3 ac tid as bidner.   Tobie Poet, NP [facility] 03/29/2022,12:03 PM  LOS: 10 days

## 2022-03-29 NOTE — Progress Notes (Signed)
PROGRESS NOTE   Subjective/Complaints: Pt without complaints this morning, Had a BM yesterday and feeling ok today. Has a day off today. No other complaints or concerns. Slept well.   ROS: Patient denies fever, blurred vision, dizziness, abd pain, nausea, vomiting, diarrhea, cough, shortness of breath or chest pain, headache, or mood change.    Objective:   No results found. No results for input(s): "WBC", "HGB", "HCT", "PLT" in the last 72 hours.  No results for input(s): "NA", "K", "CL", "CO2", "GLUCOSE", "BUN", "CREATININE", "CALCIUM" in the last 72 hours.   Intake/Output Summary (Last 24 hours) at 03/29/2022 0654 Last data filed at 03/28/2022 1842 Gross per 24 hour  Intake 708 ml  Output --  Net 708 ml      Pressure Injury 03/19/22 Ischial tuberosity Right Stage 2 -  Partial thickness loss of dermis presenting as a shallow open injury with a red, pink wound bed without slough. (Active)  03/19/22 1400  Location: Ischial tuberosity  Location Orientation: Right  Staging: Stage 2 -  Partial thickness loss of dermis presenting as a shallow open injury with a red, pink wound bed without slough.  Wound Description (Comments):   Present on Admission: Yes     Pressure Injury 03/19/22 Ischial tuberosity Left Stage 2 -  Partial thickness loss of dermis presenting as a shallow open injury with a red, pink wound bed without slough. (Active)  03/19/22 1400  Location: Ischial tuberosity  Location Orientation: Left  Staging: Stage 2 -  Partial thickness loss of dermis presenting as a shallow open injury with a red, pink wound bed without slough.  Wound Description (Comments):   Present on Admission: Yes    Physical Exam: Vital Signs Blood pressure 120/65, pulse 62, temperature 98.1 F (36.7 C), temperature source Oral, resp. rate 18, height 6\' 3"  (1.905 m), weight 104.7 kg, SpO2 99 %.  Constitutional: No distress . Vital signs  reviewed. HEENT: NCAT, EOMI, oral membranes moist Neck: supple Cardiovascular: RRR without murmur. No JVD    Respiratory/Chest: CTA Bilaterally without wheezes or rales. Normal effort    GI/Abdomen: BS +, non-tender, non-distended Ext: no clubbing, cyanosis, edema Psych: pleasant and cooperative  Skin: AVF LUE, right AKA incision CDI with staples-- covered with pant leg, no drainage noted.   Sacral wounds below: 03/19/22   03/27/22       Neuro:  Alert and oriented x 3. Normal insight and awareness. Intact Memory. Normal language and speech. Cranial nerve exam unremarkable  Musculoskeletal: right residual limb with decreasing swelling. Shrinker fitting appropriately, much less tender with touch   Assessment/Plan: 1. Functional deficits which require 3+ hours per day of interdisciplinary therapy in a comprehensive inpatient rehab setting. Physiatrist is providing close team supervision and 24 hour management of active medical problems listed below. Physiatrist and rehab team continue to assess barriers to discharge/monitor patient progress toward functional and medical goals  Care Tool:  Bathing    Body parts bathed by patient: Right arm, Left arm, Chest, Abdomen, Front perineal area, Left upper leg, Face   Body parts bathed by helper: Left lower leg Body parts n/a: Right lower leg   Bathing assist Assist Level: Minimal  Assistance - Patient > 75%     Upper Body Dressing/Undressing Upper body dressing   What is the patient wearing?: Pull over shirt    Upper body assist Assist Level: Set up assist    Lower Body Dressing/Undressing Lower body dressing      What is the patient wearing?: Pants     Lower body assist Assist for lower body dressing: Moderate Assistance - Patient 50 - 74%     Toileting Toileting    Toileting assist Assist for toileting: Maximal Assistance - Patient 25 - 49%     Transfers Chair/bed transfer  Transfers assist     Chair/bed  transfer assist level: Moderate Assistance - Patient 50 - 74%     Locomotion Ambulation   Ambulation assist      Assist level: Moderate Assistance - Patient 50 - 74% Assistive device: Walker-rolling Max distance: 8   Walk 10 feet activity   Assist  Walk 10 feet activity did not occur: Safety/medical concerns        Walk 50 feet activity   Assist Walk 50 feet with 2 turns activity did not occur: Safety/medical concerns         Walk 150 feet activity   Assist Walk 150 feet activity did not occur: Safety/medical concerns         Walk 10 feet on uneven surface  activity   Assist Walk 10 feet on uneven surfaces activity did not occur: Safety/medical concerns         Wheelchair     Assist Is the patient using a wheelchair?: Yes Type of Wheelchair: Manual    Wheelchair assist level: Supervision/Verbal cueing Max wheelchair distance: 150    Wheelchair 50 feet with 2 turns activity    Assist        Assist Level: Supervision/Verbal cueing   Wheelchair 150 feet activity     Assist      Assist Level: Supervision/Verbal cueing   Blood pressure 120/65, pulse 62, temperature 98.1 F (36.7 C), temperature source Oral, resp. rate 18, height 6\' 3"  (1.905 m), weight 104.7 kg, SpO2 99 %.  Medical Problem List and Plan: 1. Functional deficits secondary to R AKA in setting of non healing wound on RLE             -patient may  shower if covers R AKA incision             -ELOS/Goals: 18-21 days min A to supervision at w/c level  -Continue CIR therapies including PT, OT   2.  Antithrombotics: -DVT/anticoagulation:  Pharmaceutical: Eliquis 2.5mg  BID             -antiplatelet therapy: N/A 3. Pain Management:  Tylenol PRN, Oxycodone PRN.               -monitor for sedative SE. 4. Mood/Behavior/Sleep: LCSW to follow for evaluation and support.             -melatonin 5mg  prn for insomnia.              -antipsychotic agents: N/A 5.  Neuropsych/cognition: This patient is capable of making decisions on his own behalf. 6. Skin/Wound Care: Right AKA healing nicely -03/27/22 sacral wounds--appears to be evolution of areas initially thought to be skin tears. In hindsight, you can see non-viable superficial layer on initial photos which has since come off.    -medihoney small fibronecrotic areas, foam dressing daily   -pressure relief, nutrition 7. Fluids/Electrolytes/Nutrition: Strict I/O w/1200 cc  FR/Day  -labs with HD 8. T2DM: Hgb A1C-6.1 and diet controlled             -monitor BS ac/hs and use SSI for elevated BS. - extra sensitve SSI  -03/27/22 CBGs well controlled-- qd only  -03/29/22 CBGs well controlled, continue to monitor   CBG (last 3)  Recent Labs    03/28/22 1141 03/28/22 2112 03/29/22 0457  GLUCAP 87 113* 94     9. ESRD: HD MWF at the end of the day to help with tolerance of activity             -daily weights w/FR and renal diet  -03/29/22 stable, monitor Filed Weights   03/27/22 1412 03/28/22 0505 03/29/22 0500  Weight: 104.7 kg 104.7 kg 104.7 kg     10. OSA: continue BIPAP---pt refused. Says he doesn't use at home 11. Acute on anemia of chronic disease:              -03/28/22 hgb 7.4, stable  -aranesp and Fe infusions per nephrology  -asymptomatic, monitor 12. Chronic systolic CHF: Daily wt W/1027 cc FR/day. Continue Bumex 1mg  BID and Crestor 10mg  QD.  -Fluid status/weights managed with HD as above 13. Metabolic encephalopathy: Encourage CPAP use             -limit neuro sedating medications.  14. CAF: Monitor HR TID--continue Amiodarone 200mg  BID.   -03/29/22 HR well controlled, continue to monitor 15. Diarrhea: Related to milk products and has resolved. 16. VRE contact precautions - off of contact precautions given wound removal/AKA.     LOS: 10 days A FACE TO [place] 03/29/2022, 6:54 AM   ,

## 2022-03-29 NOTE — Plan of Care (Signed)
  Problem: RH Ambulation Goal: LTG Patient will ambulate in home environment (PT) Description: LTG: Patient will ambulate in home environment, # of feet with assistance (PT). Outcome: Not Progressing Flowsheets (Taken 03/29/2022 0556) LTG: Pt will ambulate in home environ  assist needed:: (D/c goal due to lack of progress secondary to decreased activity tolerance and generalized deconditioning.) --   Problem: RH Balance Goal: LTG Patient will maintain dynamic standing balance (PT) Description: LTG:  Patient will maintain dynamic standing balance with assistance during mobility activities (PT) Flowsheets (Taken 03/29/2022 0556) LTG: Pt will maintain dynamic standing balance during mobility activities with:: (Downgraded goal due to slow progress secondary to decreased activity tolerance and generalized deconditioning.) Minimal Assistance - Patient > 75% Note: Downgraded goal due to slow progress secondary to decreased activity tolerance and generalized deconditioning.    Problem: Sit to Stand Goal: LTG:  Patient will perform sit to stand with assistance level (PT) Description: LTG:  Patient will perform sit to stand with assistance level (PT) Flowsheets (Taken 03/29/2022 0556) LTG: PT will perform sit to stand in preparation for functional mobility with assistance level: (Downgraded goal due to slow progress secondary to decreased activity tolerance and generalized deconditioning.) Minimal Assistance - Patient > 75% Note: Downgraded goal due to slow progress secondary to decreased activity tolerance and generalized deconditioning.    Problem: RH Ambulation Goal: LTG Patient will ambulate in controlled environment (PT) Description: LTG: Patient will ambulate in a controlled environment, # of feet with assistance (PT). Flowsheets (Taken 03/29/2022 0556) LTG: Pt will ambulate in controlled environ  assist needed:: (Downgraded goal due to slow progress secondary to decreased activity tolerance and  generalized deconditioning.) Minimal Assistance - Patient > 75% LTG: Ambulation distance in controlled environment: 15 ft using LRAD   Problem: RH Stairs Goal: LTG Patient will ambulate up and down stairs w/assist (PT) Description: LTG: Patient will ambulate up and down # of stairs with assistance (PT) Flowsheets (Taken 03/29/2022 0556) LTG: Pt will ambulate up/down stairs assist needed:: (Downgraded goal due to slow progress secondary to decreased activity tolerance and generalized deconditioning.) Dependent - Patient equals 0% LTG: Pt will  ambulate up and down number of stairs: Patient's family will demonstrate bumping patient up small single step in the w/c simulating home entry. Note: Downgraded goal due to slow progress secondary to decreased activity tolerance and generalized deconditioning.

## 2022-03-30 LAB — CBC
HCT: 24.3 % — ABNORMAL LOW (ref 39.0–52.0)
Hemoglobin: 7.6 g/dL — ABNORMAL LOW (ref 13.0–17.0)
MCH: 28.6 pg (ref 26.0–34.0)
MCHC: 31.3 g/dL (ref 30.0–36.0)
MCV: 91.4 fL (ref 80.0–100.0)
Platelets: 184 10*3/uL (ref 150–400)
RBC: 2.66 MIL/uL — ABNORMAL LOW (ref 4.22–5.81)
RDW: 18.6 % — ABNORMAL HIGH (ref 11.5–15.5)
WBC: 4.4 10*3/uL (ref 4.0–10.5)
nRBC: 0 % (ref 0.0–0.2)

## 2022-03-30 LAB — RENAL FUNCTION PANEL
Albumin: 2.6 g/dL — ABNORMAL LOW (ref 3.5–5.0)
Anion gap: 15 (ref 5–15)
BUN: 42 mg/dL — ABNORMAL HIGH (ref 6–20)
CO2: 24 mmol/L (ref 22–32)
Calcium: 7.7 mg/dL — ABNORMAL LOW (ref 8.9–10.3)
Chloride: 93 mmol/L — ABNORMAL LOW (ref 98–111)
Creatinine, Ser: 10.67 mg/dL — ABNORMAL HIGH (ref 0.61–1.24)
GFR, Estimated: 5 mL/min — ABNORMAL LOW (ref >60)
Glucose, Bld: 88 mg/dL (ref 70–99)
Phosphorus: 3.9 mg/dL (ref 2.5–4.6)
Potassium: 4 mmol/L (ref 3.5–5.1)
Sodium: 132 mmol/L — ABNORMAL LOW (ref 135–145)

## 2022-03-30 LAB — GLUCOSE, CAPILLARY: Glucose-Capillary: 84 mg/dL (ref 70–99)

## 2022-03-30 MED ORDER — LIDOCAINE HCL (PF) 1 % IJ SOLN: 5.0000 mL | INTRAMUSCULAR | Status: DC | PRN

## 2022-03-30 MED ORDER — HEPARIN SODIUM (PORCINE) 1000 UNIT/ML IJ SOLN
3000.0000 [IU] | Freq: Once | INTRAMUSCULAR | Status: AC
  Administered 2022-03-30: 3000 [IU] via INTRAVENOUS
  Filled 2022-03-30: qty 3

## 2022-03-30 MED ORDER — HEPARIN SODIUM (PORCINE) 1000 UNIT/ML IJ SOLN: 3000.0000 [IU] | Freq: Once | INTRAMUSCULAR | Status: AC

## 2022-03-30 MED ORDER — HEPARIN SODIUM (PORCINE) 1000 UNIT/ML IJ SOLN
INTRAMUSCULAR | Status: AC
  Administered 2022-03-30: 3000 [IU] via INTRAVENOUS
  Filled 2022-03-30: qty 3

## 2022-03-30 MED ORDER — SODIUM CHLORIDE 0.9 % IV SOLN
250.0000 mg | INTRAVENOUS | Status: AC
  Administered 2022-03-30 – 2022-04-01 (×2): 250 mg via INTRAVENOUS
  Filled 2022-03-30 (×2): qty 20

## 2022-03-30 NOTE — Procedures (Signed)
HD Note:  Some information was entered later than the data was gathered due to patient care needs. The stated time with the data is accurate.  Received patient in bed to unit.  Alert and oriented.  Informed consent signed and in chart.   Patient tolerated well.  Transported back to the room  Alert, without acute distress.  Hand-off given to patient's nurse.   Access used: Left arm AVF Access issues: None  Total UF removed: 1000 ml   [facility]

## 2022-03-30 NOTE — Progress Notes (Signed)
[name], male   DOB: 1960/10/08, 62 y.o.   MRN: [id number]  1051-SW spoke with pt dtr [name] to discuss if she has made contact with pt . She states she has tried but has been unsuccessful and she also spoke to housing and they declined for him to stay. She is unable to come into the hospital today due to family issues. SW shared will need to discuss with patient. Plans for conference  call at 11:30am today.   *Pt still in therapy.   1132-SW spoke with pt dtr [name] to inform on above. She  reported that she spoke to someone, and pt can discharge back to her home. States pt will need a hospital bed as no bed available. She requests transportation home. SW informed will explore primary insurance for d/c options as Medicaid not an option for hospital discharge. SW discussed transportation to/from dialysis. States he is already set up with Medicaid transportation and she will call to resume for him.  *SW spoke to pt dtr to share medical team in agreement with hospital bed; and w/c will be ordered through [facility]. SW confirms current address for hospital bed delivery.   SW placed DME orders with [employer] via [facility].   SW called pt insurance-[employer] [phone]) and closed due to [facility] holiday.   Loralee Pacas, MSW, [place] Office: [phone] Cell: [phone] Fax: [phone]

## 2022-03-30 NOTE — Progress Notes (Signed)
Physical Therapy Session Note  Patient Details  Name: Adam Howell MRN: [id number] Date of Birth: 1960/05/02  Today's Date: 03/30/2022 PT Individual Time: 7322-0254 PT Individual Time Calculation (min): 70 min   Short Term Goals: Week 2:  PT Short Term Goal 1 (Week 2): STG=LTG due to ELOS.  Skilled Therapeutic Interventions/Progress Updates:     Pt received seated in [facility] and agrees to therapy. No complaint of pain. Pt self propels WC x150' to gym with bilateral upper extremities and cues to increase shoulder extension for more powerful pushes. Pt performs standing activities in parallel bars to work on [employer]. Pt does not require physical assistance to stand, but does benefit from cueing on hand placement. Pt alternates single upper extremity support to challenge balance. Pt attempts standing without upper extremity support but quickly has to grab bars for stability. Seated rest break. Pt then stands and pushes large exercise ball back and forth to PT. Performed to provide single upper extremity activity and challenge anticipatory postural adjustments and weight shifting. Pt completes 2x10 with each arm. Following rest break, pt performs same activity alternating upper extremity with each push, completing x40 prior to rest break. Pt asked if L lower extremity "feels tired" after activity and pt says "a little bit."  Pt asked if he is willing to try ambulating this AM. Pt declines saying he does not feel like he is ready. PT suggests attempting tin parallel bars and pt is agreeable. Pt able to hop forward x5' and backward x5' with cues for body mechanics and sequencing.   PT demonstrates performing full range heel raise with use of 4" step to allow for maximal dorsiflexion and soft tissue stretch of gastroc-soleus complex. Pt able to hop up onto step with minA and cues for sequencing, then complete x5-6 heel raises prior to requiring rest break. For 2nd bout, pt  stands with foot already positioned on step, requiring minA/modA to stand to facilitate anterior weight shift and powering up. Pt completes x10 but noted to have minimal active movement for last several reps.   Pt self propels WC x125' to ortho gym with bilateral upper extremities. Performed for mobility and endurance training. Pt then completes x10:00 on upper extremity ergometer for endurance training. Pt completes x5:00 forward at resistance of 5.0, then x5:00 backward at same resistance.   WC transport back to room. Left seated with all needs within reach.  Therapy Documentation Precautions:  Precautions Precautions: Fall Precaution Comments: s/p R AKA Restrictions Weight Bearing Restrictions: Yes RLE Weight Bearing: Non weight bearing   Therapy/Group: Individual Therapy  [name] Coons, PT, DPT 03/30/2022, 4:50 PM

## 2022-03-30 NOTE — Progress Notes (Signed)
[facility] Progress Note   Subjective:    Seen and examined patient at bedside. Just worked with OT.  Sitting up in chair, NAD  Objective Vitals:   03/29/22 1330 03/29/22 1924 03/30/22 0243 03/30/22 0444  BP: 135/72 136/74 128/73   Pulse: 69 70 67   Resp: 16 18 18    Temp: 98 F (36.7 C) 98.2 F (36.8 C) 98.6 F (37 C)   TempSrc: Oral Oral Oral   SpO2: 99% 99% 97%   Weight:    104 kg  Height:       Physical Exam General adult male in bed in no acute distress Lungs clear Heart S1S2 no rub Abdomen soft nontender Extremities no edema, Rt AKA Access LUE AVF + T/B, arterial site has thin/ pink skin, per pt improving--> may need OP attention re: revision  Filed Weights   03/28/22 0505 03/29/22 0500 03/30/22 0444  Weight: 104.7 kg 104.7 kg 104 kg    Intake/Output Summary (Last 24 hours) at 03/30/2022 1037 Last data filed at 03/29/2022 2000 Gross per 24 hour  Intake 480 ml  Output --  Net 480 ml    Additional Objective Labs: Basic Metabolic Panel: Recent Labs  Lab 03/23/22 1530 03/25/22 1932  NA 133* 134*  K 4.7 3.6  CL 93* 95*  CO2 27 29  GLUCOSE 97 89  BUN 42* 17  CREATININE 10.02* 5.12*  CALCIUM 8.1* 7.9*  PHOS 4.1 2.4*   Liver Function Tests: Recent Labs  Lab 03/23/22 1530 03/25/22 1932  ALBUMIN 2.3* 2.3*   No results for input(s): "LIPASE", "AMYLASE" in the last 168 hours. CBC: Recent Labs  Lab 03/23/22 1530 03/25/22 1416  WBC 9.0 6.5  HGB 7.3* 7.4*  HCT 22.2* 23.0*  MCV 88.1 89.1  PLT 295 300   Blood Culture    Component Value Date/Time   SDES  03/07/2022 0950    WOUND Performed at [facility], [address] [place], [place] [address]    [facility]  03/07/2022 0950    right lower extremity wound Performed at [facility], [address] [place], [place] [address]    CULT  03/07/2022 0950    FEW CITROBACTER KOSERI FEW ENTEROCOCCUS FAECIUM FEW PROTEUS MIRABILIS VANCOMYCIN RESISTANT  ENTEROCOCCUS ISOLATED NO ANAEROBES ISOLATED Performed at [facility], [place] [address], [place],  [address]    REPTSTATUS 03/12/2022 FINAL 03/07/2022 0950    Cardiac Enzymes: No results for input(s): "CKTOTAL", "CKMB", "CKMBINDEX", "TROPONINI" in the last 168 hours. CBG: Recent Labs  Lab 03/28/22 0617 03/28/22 1141 03/28/22 2112 03/29/22 0457 03/30/22 0456  GLUCAP 81 87 113* 94 84   Iron Studies: No results for input(s): "IRON", "TIBC", "TRANSFERRIN", "FERRITIN" in the last 72 hours. Lab Results  Component Value Date   INR 1.2 03/05/2022   Studies/Results: No results found.  Medications:  ferric gluconate (FERRLECIT) IVPB 50 mL/hr at 03/26/22 1150    amiodarone  200 mg Oral BID   apixaban  2.5 mg Oral BID   bumetanide  1 mg Oral BID   calcitRIOL  1.5 mcg Oral Q M,W,F-1800   Chlorhexidine Gluconate Cloth  6 each Topical Q0600   cinacalcet  30 mg Oral Q breakfast   Darbepoetin Alfa  100 mcg Subcutaneous Q7 days   hydrocerin   Topical BID   leptospermum manuka honey  1 Application Topical Daily   multivitamin  1 tablet Oral QHS   rosuvastatin  10 mg Oral Daily   senna  2 tablet Oral QHS   sevelamer carbonate  1,600 mg Oral TID WC    Dialysis Orders: MWF [facility]  4.5h  113.6kg   450/800   2/2.5 bath   AVF 14ga x2  Hep 7500+ 3044midrun - mircera 100 q4 - sensipar 60 tiw - calcitriol 1.5 mcg po tiw  Assessment/Plan: Debility - on CIR SP R AKA  ESRD - on HD MWF. Next HD 03/30/22.  HTN - stable, normal to slightly high Vol - have lowered edw w/ AKA and vol removal is 8kg under now. Euvolemic, low UF goal w/ next HD. Lower EDW at discharge. Anemia esrd - Hb 7-8 range, getting esa here w/ darbe 100 ug w Friday. Tsat was 18% and IV Fe is ordered.  MBD ckd - CCa in range, phos is up a bit. Cont daily sensipar, tiw po vdra and renvela 3 ac tid as bidner.   Madelon Lips MD [place] [facility] [phone] 03/30/2022,10:37  AM  LOS: 11 days

## 2022-03-30 NOTE — Progress Notes (Signed)
PROGRESS NOTE   Subjective/Complaints: No new issues. Up early with OT today  ROS: Patient denies fever, rash, sore throat, blurred vision, dizziness, nausea, vomiting, diarrhea, cough, shortness of breath or chest pain, joint or back/neck pain, headache, or mood change.    Objective:   No results found. No results for input(s): "WBC", "HGB", "HCT", "PLT" in the last 72 hours.  No results for input(s): "NA", "K", "CL", "CO2", "GLUCOSE", "BUN", "CREATININE", "CALCIUM" in the last 72 hours.   Intake/Output Summary (Last 24 hours) at 03/30/2022 0924 Last data filed at 03/29/2022 2000 Gross per 24 hour  Intake 480 ml  Output --  Net 480 ml     Pressure Injury 03/19/22 Ischial tuberosity Right Stage 2 -  Partial thickness loss of dermis presenting as a shallow open injury with a red, pink wound bed without slough. (Active)  03/19/22 1400  Location: Ischial tuberosity  Location Orientation: Right  Staging: Stage 2 -  Partial thickness loss of dermis presenting as a shallow open injury with a red, pink wound bed without slough.  Wound Description (Comments):   Present on Admission: Yes     Pressure Injury 03/19/22 Ischial tuberosity Left Stage 2 -  Partial thickness loss of dermis presenting as a shallow open injury with a red, pink wound bed without slough. (Active)  03/19/22 1400  Location: Ischial tuberosity  Location Orientation: Left  Staging: Stage 2 -  Partial thickness loss of dermis presenting as a shallow open injury with a red, pink wound bed without slough.  Wound Description (Comments):   Present on Admission: Yes    Physical Exam: Vital Signs Blood pressure 128/73, pulse 67, temperature 98.6 F (37 C), temperature source Oral, resp. rate 18, height 6\' 3"  (1.905 m), weight 104 kg, SpO2 97 %.  Constitutional: No distress . Vital signs reviewed. HEENT: NCAT, EOMI, oral membranes moist Neck:  supple Cardiovascular: RRR without murmur. No JVD    Respiratory/Chest: CTA Bilaterally without wheezes or rales. Normal effort    GI/Abdomen: BS +, non-tender, non-distended Ext: no clubbing, cyanosis, or edema Psych: pleasant and cooperative  Skin: AVF LUE, right AKA incision CDI with staples-- covered with pant leg, no drainage noted.   Sacral wounds per below: 03/19/22   03/27/22       Neuro:  Alert and oriented x 3. Normal insight and awareness. Intact Memory. Normal language and speech. Cranial nerve exam unremarkable  Musculoskeletal: right residual limb with improving swelling. Shrinker fitting appropriately, much less tender with touch   Assessment/Plan: 1. Functional deficits which require 3+ hours per day of interdisciplinary therapy in a comprehensive inpatient rehab setting. Physiatrist is providing close team supervision and 24 hour management of active medical problems listed below. Physiatrist and rehab team continue to assess barriers to discharge/monitor patient progress toward functional and medical goals  Care Tool:  Bathing    Body parts bathed by patient: Right arm, Left arm, Chest, Abdomen, Front perineal area, Left upper leg, Face   Body parts bathed by helper: Left lower leg Body parts n/a: Right lower leg   Bathing assist Assist Level: Minimal Assistance - Patient > 75%     Upper Body Dressing/Undressing  Upper body dressing   What is the patient wearing?: Pull over shirt    Upper body assist Assist Level: Set up assist    Lower Body Dressing/Undressing Lower body dressing      What is the patient wearing?: Pants     Lower body assist Assist for lower body dressing: Moderate Assistance - Patient 50 - 74%     Toileting Toileting    Toileting assist Assist for toileting: Maximal Assistance - Patient 25 - 49%     Transfers Chair/bed transfer  Transfers assist     Chair/bed transfer assist level: Moderate Assistance - Patient 50 -  74%     Locomotion Ambulation   Ambulation assist      Assist level: Moderate Assistance - Patient 50 - 74% Assistive device: Walker-rolling Max distance: 8   Walk 10 feet activity   Assist  Walk 10 feet activity did not occur: Safety/medical concerns        Walk 50 feet activity   Assist Walk 50 feet with 2 turns activity did not occur: Safety/medical concerns         Walk 150 feet activity   Assist Walk 150 feet activity did not occur: Safety/medical concerns         Walk 10 feet on uneven surface  activity   Assist Walk 10 feet on uneven surfaces activity did not occur: Safety/medical concerns         Wheelchair     Assist Is the patient using a wheelchair?: Yes Type of Wheelchair: Manual    Wheelchair assist level: Supervision/Verbal cueing Max wheelchair distance: 150    Wheelchair 50 feet with 2 turns activity    Assist        Assist Level: Supervision/Verbal cueing   Wheelchair 150 feet activity     Assist      Assist Level: Supervision/Verbal cueing   Blood pressure 128/73, pulse 67, temperature 98.6 F (37 C), temperature source Oral, resp. rate 18, height 6\' 3"  (1.905 m), weight 104 kg, SpO2 97 %.  Medical Problem List and Plan: 1. Functional deficits secondary to R AKA in setting of non healing wound on RLE             -patient may  shower if covers R AKA incision             -ELOS/Goals: 18-21 days min A to supervision at w/c level  -Continue CIR therapies including PT, OT, and SLP   2.  Antithrombotics: -DVT/anticoagulation:  Pharmaceutical: Eliquis 2.5mg  BID             -antiplatelet therapy: N/A 3. Pain Management:  Tylenol PRN, Oxycodone PRN.               -monitor for sedative SE. 4. Mood/Behavior/Sleep: LCSW to follow for evaluation and support.             -melatonin 5mg  prn for insomnia.              -antipsychotic agents: N/A 5. Neuropsych/cognition: This patient is capable of making decisions  on his own behalf. 6. Skin/Wound Care: Right AKA healing nicely -03/27/22 sacral wounds--appears to be evolution of areas initially thought to be skin tears. In hindsight, you can see non-viable superficial layer on initial photos which has since come off.   -03/30/22 -medihoney to small fibronecrotic areas, foam dressing daily   -continue pressure relief, nutrition 7. Fluids/Electrolytes/Nutrition: Strict I/O w/1200 cc FR/Day  -labs with HD 8. T2DM: Hgb  A1C-6.1 and diet controlled             -monitor BS ac/hs and use SSI for elevated BS. - extra sensitve SSI   -03/30/22 CBGs well controlled, continue to monitor   CBG (last 3)  Recent Labs    03/28/22 2112 03/29/22 0457 03/30/22 0456  GLUCAP 113* 94 84    9. ESRD: HD MWF at the end of the day to help with tolerance of activity             -daily weights w/FR and renal diet  -03/30/22 stable, monitor Filed Weights   03/28/22 0505 03/29/22 0500 03/30/22 0444  Weight: 104.7 kg 104.7 kg 104 kg     10. OSA: continue BIPAP---pt refused. Says he doesn't use at home 11. Acute on anemia of chronic disease:              -03/28/22 hgb 7.4, stable  -aranesp and Fe infusions per nephrology  -asymptomatic, monitor 12. Chronic systolic CHF: Daily wt M/6381 cc FR/day. Continue Bumex 1mg  BID and Crestor 10mg  QD.  -Fluid status/weights managed with HD as above 13. Metabolic encephalopathy: Encourage CPAP use             -limit neuro sedating medications.  14. CAF: Monitor HR TID--continue Amiodarone 200mg  BID.   -03/30/22 HR well controlled, continue to monitor 15. Diarrhea: Related to milk products and has resolved. 16. VRE contact precautions - off of contact precautions given wound removal/AKA.     LOS: 11 days A FACE TO FACE EVALUATION WAS PERFORMED  Meredith Staggers 03/30/2022, 9:24 AM   ,

## 2022-03-30 NOTE — Progress Notes (Signed)
Occupational Therapy Session Note  Patient Details  Name: Adam Howell MRN: [id number] Date of Birth: October 30, 1960  Today's Date: 03/30/2022 OT Individual Time: 0100-7121 OT Individual Time Calculation (min): 115 min    Short Term Goals: Week 2:  OT Short Term Goal 1 (Week 2): STG=LTG d/t ELOS  Skilled Therapeutic Interventions/Progress Updates:    Session 1 Pt greeted semi-reclined in bed and agreeable to OT treatment session. Pt wanted to wash off at the sink. Pt completed bed mobility with min A. He then doffed clothing seated EOB using lateral leans.Pt then completed stand-pivot w/ RW and CGA. Bathing completed at the sink sit<>stand with CGA for balance when washing buttocks. Pt with bleeding from wound on buttocks and nursing came in and assisted with dressing change in standing. Pt tolerated standing for 2 more minutes while nursing changed bandage. Pt then transferred  back to bed with CGA stand-pivot w/ RW to don R AKA shrinker. Nurse assisted with 4 hands approach to get shrinker on the right way. LB dressing at bed level with education provided for tecnque. Rolling and bridging to pull up pants. Pt then pivoted to wc in similar fashion as above. Pt propelled wc to therapy gym with increaesed time and supervision.  Pt completed stand-pivot with RW and CGA. UB there-ex seated on therapy mat using 5 lb dowel rod and 3 lb weighted ball. 3 sets of 10 chest press, bicep curl, seated row, straight arm raise, overhead press, and oblique twist. Pt then transferred back to wc in similar fashion. Continued treatment session with focus on community reintegration from wc level. OT educated on community access from wc and getting in and out of elevators. Pt able to back into elevator with supervision. Pt propelled wc to gift shop and was able to maneuver around gift shop safely. OT then educated on wc positioning for transfer in and out of a booth and practiced transfers at booth in the [facility]. He  needed min A from lower and softer surface to power up for transfer. Pt propelled wc back  with increased time and intermittent rest breaks. Pt returned to room and left seated in wc with alarm belt on, call bell in reach, and needs met.   Therapy Documentation Precautions:  Precautions Precautions: Fall Precaution Comments: s/p R AKA Restrictions Weight Bearing Restrictions: Yes RLE Weight Bearing: Non weight bearing Pain: Pain Assessment Pain Scale: 0-10 Pain Score: 9  repositioned  Therapy/Group: Individual Therapy  Valma Cava 03/30/2022, 8:06 AM

## 2022-03-31 DIAGNOSIS — L8990 Pressure ulcer of unspecified site, unspecified stage: Secondary | ICD-10-CM | POA: Insufficient documentation

## 2022-03-31 LAB — GLUCOSE, CAPILLARY: Glucose-Capillary: 78 mg/dL (ref 70–99)

## 2022-03-31 MED ORDER — MELATONIN 5 MG PO TABS
10.0000 mg | ORAL_TABLET | Freq: Every evening | ORAL | Status: DC | PRN
  Administered 2022-04-03: 10 mg via ORAL
  Filled 2022-03-31: qty 2

## 2022-03-31 NOTE — Progress Notes (Signed)
[name], male   DOB: 21-Mar-1960, 62 y.o.   MRN: [id number]  SW ordered TTB and DABSC with [employer] via parachute.   843am-SW called pt insurance [employer] [phone]) to discuss if pt offered transportation benefits. SW confirms pt eligible for transportation benefits. Will need to call [place] #[phone] to schedule.   SW met with pt in room to provide updates from team conference, and confirm d/c date remains 1/20. SW shared on DME ordered, and items that will be delivered to the home. SW informed on scheduling transportation through his insurance on date of discharge since dtr is not able to pick him up.   Loralee Pacas, MSW, [place] Office: [phone] Cell: [phone] Fax: [phone]

## 2022-03-31 NOTE — Progress Notes (Signed)
Physical Therapy Session Note  Patient Details  Name: Adam Howell MRN: [id number] Date of Birth: 06-13-1960  Today's Date: 03/31/2022 PT Individual Time: 1829-9371 PT Individual Time Calculation (min): 28 min  and Today's Date: 03/31/2022 PT Missed Time: 72 Minutes Missed Time Reason: Pain;Patient unwilling to participate  Short Term Goals: Week 2:  PT Short Term Goal 1 (Week 2): STG=LTG due to ELOS.  Skilled Therapeutic Interventions/Progress Updates:     Pt received seated in [facility] and agrees to therapy. No complaint of pain. Pt self propels WC x150' to gym with bilateral upper extremities to work on endurance training as well as mobility training. Lateral squat pivot transfer to mat with minA to facilitate optimal mechanics and anterior trunk lean, with cues for sequencing. In short sitting on edge of mat, pt completes 3x10 LAQs with 7lb ankle weight for resistance. PT provides cues to completes full ROM and use slow eccentric control of knee flexion for increase strengthening. Following, pt completes 3x6 seated triceps press ups with handled platforms. Cues provided for trunk position as well as scapular depression and downward rotation for improved mechanics. Squat pivot back to WC with minA. WC transport back to room. Left seated with all needs within reach.  2nd Session: Pt received supine in bed and says he cannot participate due to whole body feeling "stiff". PT encourages participation but pt continues to refuse. PT will follow up as able. Pt misses 45 minutes of skilled PT.   Therapy Documentation Precautions:  Precautions Precautions: Fall Precaution Comments: s/p R AKA Restrictions Weight Bearing Restrictions: Yes RLE Weight Bearing: Non weight bearing   Therapy/Group: Individual Therapy  [name] Coons, PT, DPT 03/31/2022, 5:05 PM

## 2022-03-31 NOTE — Progress Notes (Signed)
[facility] Progress Note   Subjective:    Seen and examined patient at bedside. Sleepy -didn't sleep well last night.  Completed dialysis yesterday net UF 1L   Objective Vitals:   03/30/22 1633 03/30/22 1729 03/30/22 1959 03/31/22 0252  BP:  (!) 140/79 133/64 131/79  Pulse:  71 67 67  Resp:  16 18 18   Temp:  98.1 F (36.7 C) 98.4 F (36.9 C) 97.8 F (36.6 C)  TempSrc:   Oral Oral  SpO2:  99% 100% 100%  Weight: 108.9 kg     Height:       Physical Exam General adult male in bed in no acute distress Lungs clear Heart S1S2 no rub Abdomen soft nontender Extremities no edema, Rt AKA Access LUE AVF + T/B, arterial site has thin/ pink skin, per pt improving--> may need OP attention re: revision  Filed Weights   03/30/22 0444 03/30/22 1218 03/30/22 1633  Weight: 104 kg 109.3 kg 108.9 kg    Intake/Output Summary (Last 24 hours) at 03/31/2022 1116 Last data filed at 03/31/2022 0724 Gross per 24 hour  Intake 296.29 ml  Output 1000 ml  Net -703.71 ml     Additional Objective Labs: Basic Metabolic Panel: Recent Labs  Lab 03/25/22 1932 03/30/22 1228  NA 134* 132*  K 3.6 4.0  CL 95* 93*  CO2 29 24  GLUCOSE 89 88  BUN 17 42*  CREATININE 5.12* 10.67*  CALCIUM 7.9* 7.7*  PHOS 2.4* 3.9    Liver Function Tests: Recent Labs  Lab 03/25/22 1932 03/30/22 1228  ALBUMIN 2.3* 2.6*    No results for input(s): "LIPASE", "AMYLASE" in the last 168 hours. CBC: Recent Labs  Lab 03/25/22 1416 03/30/22 1228  WBC 6.5 4.4  HGB 7.4* 7.6*  HCT 23.0* 24.3*  MCV 89.1 91.4  PLT 300 184    Blood Culture    Component Value Date/Time   SDES  03/07/2022 0950    WOUND Performed at [facility], [address] [place]elaine Bhat [place], [place] [address]    [facility]  03/07/2022 0950    right lower extremity wound Performed at [facility], [address] [place], [place] [address]    CULT  03/07/2022 0950    FEW CITROBACTER KOSERI FEW ENTEROCOCCUS  FAECIUM FEW PROTEUS MIRABILIS VANCOMYCIN RESISTANT ENTEROCOCCUS ISOLATED NO ANAEROBES ISOLATED Performed at [facility], [place] [address] [place], [place] [address]    REPTSTATUS 03/12/2022 FINAL 03/07/2022 0950    Cardiac Enzymes: No results for input(s): "CKTOTAL", "CKMB", "CKMBINDEX", "TROPONINI" in the last 168 hours. CBG: Recent Labs  Lab 03/28/22 0617 03/28/22 1141 03/28/22 2112 03/29/22 0457 03/30/22 0456  GLUCAP 81 87 113* 94 84    Iron Studies: No results for input(s): "IRON", "TIBC", "TRANSFERRIN", "FERRITIN" in the last 72 hours. Lab Results  Component Value Date   INR 1.2 03/05/2022   Studies/Results: No results found.  Medications:  ferric gluconate (FERRLECIT) IVPB 250 mg (03/30/22 1518)    amiodarone  200 mg Oral BID   apixaban  2.5 mg Oral BID   bumetanide  1 mg Oral BID   calcitRIOL  1.5 mcg Oral Q M,W,F-1800   Chlorhexidine Gluconate Cloth  6 each Topical Q0600   cinacalcet  30 mg Oral Q breakfast   Darbepoetin Alfa  100 mcg Subcutaneous Q7 days   hydrocerin   Topical BID   leptospermum manuka honey  1 Application Topical Daily   multivitamin  1 tablet Oral QHS  rosuvastatin  10 mg Oral Daily   senna  2 tablet Oral QHS   sevelamer carbonate  1,600 mg Oral TID WC    Dialysis Orders: MWF [facility]  4.5h  113.6kg   450/800   2/2.5 bath   AVF 14ga x2  Hep 7500+ 3033midrun - mircera 100 q4 - sensipar 60 tiw - calcitriol 1.5 mcg po tiw  Assessment/Plan: Debility - on CIR SP R AKA  ESRD - on HD MWF. Next HD 04/01/22.  HTN - stable, normal to slightly high Vol - have lowered edw w/ AKA and vol removal is 8kg under now. Euvolemic, low UF goal w/ next HD. Lower EDW at discharge. Anemia esrd - Hb 7-8 range, getting esa here w/ darbe 100 ug w Friday. Tsat was 18% and IV Fe is ordered.  MBD ckd - CCa in range, Phos at goal Cont daily sensipar, tiw po vdra and renvela 3 ac tid as bidner.   Lynnda Child PA-C [facility] 03/31/2022,11:16 AM

## 2022-03-31 NOTE — Progress Notes (Signed)
PROGRESS NOTE   Subjective/Complaints: Didn't sleep all that well. Just was restless. Pain not an issue  ROS: Patient denies fever, rash, sore throat, blurred vision, dizziness, nausea, vomiting, diarrhea, cough, shortness of breath or chest pain,   headache, or mood change.    Objective:   No results found. Recent Labs    03/30/22 1228  WBC 4.4  HGB 7.6*  HCT 24.3*  PLT 184    Recent Labs    03/30/22 1228  NA 132*  K 4.0  CL 93*  CO2 24  GLUCOSE 88  BUN 42*  CREATININE 10.67*  CALCIUM 7.7*     Intake/Output Summary (Last 24 hours) at 03/31/2022 2080 Last data filed at 03/31/2022 2233 Gross per 24 hour  Intake 296.29 ml  Output 1000 ml  Net -703.71 ml     Pressure Injury 03/19/22 Ischial tuberosity Right Stage 2 -  Partial thickness loss of dermis presenting as a shallow open injury with a red, pink wound bed without slough. (Active)  03/19/22 1400  Location: Ischial tuberosity  Location Orientation: Right  Staging: Stage 2 -  Partial thickness loss of dermis presenting as a shallow open injury with a red, pink wound bed without slough.  Wound Description (Comments):   Present on Admission: Yes     Pressure Injury 03/19/22 Ischial tuberosity Left Stage 2 -  Partial thickness loss of dermis presenting as a shallow open injury with a red, pink wound bed without slough. (Active)  03/19/22 1400  Location: Ischial tuberosity  Location Orientation: Left  Staging: Stage 2 -  Partial thickness loss of dermis presenting as a shallow open injury with a red, pink wound bed without slough.  Wound Description (Comments):   Present on Admission: Yes    Physical Exam: Vital Signs Blood pressure 131/79, pulse 67, temperature 97.8 F (36.6 C), temperature source Oral, resp. rate 18, height 6\' 3"  (1.905 m), weight 108.9 kg, SpO2 100 %.  Constitutional: No distress . Vital signs reviewed. HEENT: NCAT, EOMI, oral  membranes moist Neck: supple Cardiovascular: RRR without murmur. No JVD    Respiratory/Chest: CTA Bilaterally without wheezes or rales. Normal effort    GI/Abdomen: BS +, non-tender, non-distended Ext: no clubbing, cyanosis, or edema Psych: pleasant and cooperative  Skin: AVF LUE, right AKA incision CDI with staples-- covered with shrinker, no drainage noted thru fabric.   Sacral wounds per below: 03/19/22   03/27/22       Neuro:  Alert and oriented x 3. Normal insight and awareness. Intact Memory. Normal language and speech. Cranial nerve exam unremarkable  Musculoskeletal: right residual limb with less edema, good shape. Shrinker fitting appropriately, much less tender with touch   Assessment/Plan: 1. Functional deficits which require 3+ hours per day of interdisciplinary therapy in a comprehensive inpatient rehab setting. Physiatrist is providing close team supervision and 24 hour management of active medical problems listed below. Physiatrist and rehab team continue to assess barriers to discharge/monitor patient progress toward functional and medical goals  Care Tool:  Bathing    Body parts bathed by patient: Right arm, Left arm, Chest, Abdomen, Front perineal area, Left upper leg, Face   Body parts bathed  by helper: Left lower leg Body parts n/a: Right lower leg   Bathing assist Assist Level: Minimal Assistance - Patient > 75%     Upper Body Dressing/Undressing Upper body dressing   What is the patient wearing?: Pull over shirt    Upper body assist Assist Level: Set up assist    Lower Body Dressing/Undressing Lower body dressing      What is the patient wearing?: Pants     Lower body assist Assist for lower body dressing: Moderate Assistance - Patient 50 - 74%     Toileting Toileting    Toileting assist Assist for toileting: Maximal Assistance - Patient 25 - 49%     Transfers Chair/bed transfer  Transfers assist     Chair/bed transfer assist  level: Moderate Assistance - Patient 50 - 74%     Locomotion Ambulation   Ambulation assist      Assist level: Moderate Assistance - Patient 50 - 74% Assistive device: Walker-rolling Max distance: 8   Walk 10 feet activity   Assist  Walk 10 feet activity did not occur: Safety/medical concerns        Walk 50 feet activity   Assist Walk 50 feet with 2 turns activity did not occur: Safety/medical concerns         Walk 150 feet activity   Assist Walk 150 feet activity did not occur: Safety/medical concerns         Walk 10 feet on uneven surface  activity   Assist Walk 10 feet on uneven surfaces activity did not occur: Safety/medical concerns         Wheelchair     Assist Is the patient using a wheelchair?: Yes Type of Wheelchair: Manual    Wheelchair assist level: Supervision/Verbal cueing Max wheelchair distance: 150    Wheelchair 50 feet with 2 turns activity    Assist        Assist Level: Supervision/Verbal cueing   Wheelchair 150 feet activity     Assist      Assist Level: Supervision/Verbal cueing   Blood pressure 131/79, pulse 67, temperature 97.8 F (36.6 C), temperature source Oral, resp. rate 18, height 6\' 3"  (1.905 m), weight 108.9 kg, SpO2 100 %.  Medical Problem List and Plan: 1. Functional deficits secondary to R AKA in setting of non healing wound on RLE             -patient may  shower if covers R AKA incision             -ELOS/Goals: 18-21 days min A to supervision at w/c level  -Continue CIR therapies including PT and OT. Interdisciplinary team conference today to discuss goals, barriers to discharge, and dc planning.    2.  Antithrombotics: -DVT/anticoagulation:  Pharmaceutical: Eliquis 2.5mg  BID             -antiplatelet therapy: N/A 3. Pain Management:  Tylenol PRN, Oxycodone PRN.               -monitor for sedative SE. 4. Mood/Behavior/Sleep: LCSW to follow for evaluation and support.              -melatonin 5mg  prn for insomnia. Will increase to 10mg              -antipsychotic agents: N/A 5. Neuropsych/cognition: This patient is capable of making decisions on his own behalf. 6. Skin/Wound Care: Right AKA healing nicely -03/27/22 sacral wounds--appears to be evolution of areas initially thought to be skin tears. In  hindsight, you can see non-viable superficial layer on initial photos which has since come off.   -1/15-16 -medihoney to small fibronecrotic areas, foam dressing daily   -continue pressure relief, nutrition 7. Fluids/Electrolytes/Nutrition: Strict I/O w/1200 cc FR/Day  -labs with HD 8. T2DM: Hgb A1C-6.1 and diet controlled             -monitor BS ac/hs and use SSI for elevated BS. - extra sensitve SSI   -03/30/22 CBGs well controlled, continue to monitor   CBG (last 3)  Recent Labs    03/28/22 2112 03/29/22 0457 03/30/22 0456  GLUCAP 113* 94 84    9. ESRD: HD MWF at the end of the day to help with tolerance of activity             -daily weights w/FR and renal diet  -03/31/22 stable, monitor Filed Weights   03/30/22 0444 03/30/22 1218 03/30/22 1633  Weight: 104 kg 109.3 kg 108.9 kg     10. OSA: continue BIPAP---pt refused. Says he doesn't use at home 11. Acute on anemia of chronic disease:              -03/28/22 hgb 7.4, stable  -aranesp and Fe infusions per nephrology  -asymptomatic, monitor 12. Chronic systolic CHF: Daily wt B/5670 cc FR/day. Continue Bumex 1mg  BID and Crestor 10mg  QD.  -Fluid status/weights managed with HD as above 13. Metabolic encephalopathy: Encourage CPAP use             -limit neuro sedating medications.  14. CAF: Monitor HR TID--continue Amiodarone 200mg  BID.   -03/31/22 HR well controlled, continue to monitor 15. Diarrhea: Related to milk products and has resolved. 16. VRE contact precautions - off of contact precautions given wound removal/AKA.     LOS: 12 days A FACE TO [place] 03/31/2022, 9:22 AM   ,

## 2022-03-31 NOTE — Progress Notes (Signed)
Occupational Therapy Session Note  Patient Details  Name: Adam Howell MRN: [id number] Date of Birth: October 01, 1960  Today's Date: 03/31/2022 OT Individual Time: 5284-1324 OT Individual Time Calculation (min): 53 min    Short Term Goals: Week 1:  OT Short Term Goal 1 (Week 1): Pt will complete STS with MOD A in prep for clothing managment OT Short Term Goal 1 - Progress (Week 1): Met OT Short Term Goal 2 (Week 1): Pt will complete 2/3 steps of toileting OT Short Term Goal 2 - Progress (Week 1): Met OT Short Term Goal 3 (Week 1): Pt will transfer to toilet with MIN A and LRAD OT Short Term Goal 3 - Progress (Week 1): Met OT Short Term Goal 4 (Week 1): Pt will initiate shower level bathing training with no more than min A OT Short Term Goal 4 - Progress (Week 1): Met  Skilled Therapeutic Interventions/Progress Updates:    Pt received in w/c with 7 out of 10 pain in R residual limb. Rest and repositiong provided for pain relief. Pt reporting bad night with no sleep "not even wanting to do anything" but eventually agreeable to short exercise and OT providing written instructions for daughter.    Therapeutic exercise 1x4 min forward and 1x  4 min backwards with 1 min rest in between for BUE strength/endurance required for BADLs with Pt fav music on. Pt cued to keep RPM >25 to push endurance  W/c mobility to/from room with supervision and increased time   Therapeutic activity OT spent the remainder of time writing up a handout for pt daughter as it is unclear if she will be able to come in and practice family education despite the recomendation General mobility- they should always use their walker (RW) They may benefit from a walker tray to transport items from room to room if walking with a walker is recommended by physical therapy. The walker should be kept within reach so they can pull it close to get up and keep with them to back up to any surface they want to sit on. When getting  up, they should push up from the surface they are getting up from and reach back when sitting to a new surface, no plopping.  To get up have the Right hand on the walker and the Left hand pushing up from whatever he is getting up from (commode, bed, w/c) You are their "shadow." Especially in the beginning. You, as the helper should be in reach of the patient when mobilizing if not have a hand on the gait belt. You should be either beside or behind them so if they lose their balance you can assist by helping correct at the hips. This is likely closer than you are used to being- be in their personal space.  If you are attempting to get up/transfer and it is not going well, reset. Have them sit back down. Make sure they are close to the edge of the seat, L foot is underneath them, and they are leaning forward to stand up. Always make sure the wheelchair leg rests are out of the way to keep you & them from tripping on them Bathing- they should sit to bathe on a tub transfer bench use walker to get to the edge of the tub bench, back up to the edge, reach back prior to sitting down. Turn to swing legs into tub and scoot across. Reverse to exit the tub. Make sure both legs are out of the tub  prior to standing to exit the shower Until the surgeon says otherwise, you need to cover the R leg to keep dry. You can use a garbage bag and surgical tape to keep the incision clean and dry. This is easiest to put on from laying down Dressing- all should be done from a SEATED level, the edge of the bed is easiest so he can lean or roll in both directions to pull pants past his hips.  The shrinker sock should we worn at all times except for showering. This helps shape the leg in preparation for a prosthesis It is easiest to put on with 2 people stretching it side to side and up and down. Eventually Adam Howell should be able ot put it on, on his own. Fasten the shrinker around his waist using the Velcro strap, place the ring on  the end of the gray material sliding up to the end of his leg, and fold the excess back over the R leg for extra compression Toileting- seated toileting is more appropriate currently. The commode should go over the toilet to raise it higher or be set up in a room if he cannot get the wheelchair into the bathroom. The commode arm rest can either drop down and out of the way for him to scoot across to the seat, or it can remain up for him to stand and use his walker to turn over to the commode. He does well to stand and pull his pants down/up with his Left hand and  Right hand on walker If he is tired then he can stay seated and lean side to side to pull pants up and down while sitting.  Energy conservation principles- Prioritize what needs to be done and what can be moved to another day Plan out their days, weeks, months to spread out taxing (physical or cognitively tiring) activities to not put too much at one time Pace activities- rest before feeling tired and have designated places to rest if they feel tired and need to take a brake Position for success: sit when able to conserve 25% more energy than standing  Pt left at end of session in bed with exit alarm on, call light in reach and all needs met   Therapy Documentation Precautions:  Precautions Precautions: Fall Precaution Comments: s/p R AKA Restrictions Weight Bearing Restrictions: Yes RLE Weight Bearing: Non weight bearing General:   Therapy/Group: Individual Therapy  [name] 03/31/2022, 6:56 AM

## 2022-03-31 NOTE — Discharge Summary (Signed)
Physician Discharge Summary  [name] MRN: [id number] DOB/AGE: Sep 29, 1960 62 y.o.  Admit date: 03/19/2022 Discharge date: 04/04/2022  Discharge Diagnoses:  Principal Problem:   Above knee amputation of right lower extremity ([place]) Active Problems:   Anemia in chronic kidney disease   ESRD on dialysis [facility])   Hypertensive heart and kidney disease with heart failure and chronic kidney disease stage V (HCC)   Type 2 diabetes mellitus with ESRD (end-stage renal disease) ([place])   Heart failure with reduced ejection fraction (HCC)   Chronic atrial fibrillation with RVR ([place])   Pressure injury of skin   Discharged Condition: stable  Significant Diagnostic Studies: N/A   Labs:  Basic Metabolic Panel:    Latest Ref Rng & Units 04/01/2022    2:07 PM 03/30/2022   12:28 PM 03/25/2022    7:32 PM  BMP  Glucose 70 - 99 mg/dL 98  88  89   BUN 6 - 20 mg/dL 38  42  17   Creatinine 0.61 - 1.24 mg/dL 9.42  10.67  5.12   Sodium 135 - 145 mmol/L 130  132  134   Potassium 3.5 - 5.1 mmol/L 4.4  4.0  3.6   Chloride 98 - 111 mmol/L 92  93  95   CO2 22 - 32 mmol/L 25  24  29    Calcium 8.9 - 10.3 mg/dL 7.5  7.7  7.9      CBC:    Latest Ref Rng & Units 04/01/2022    2:07 PM 03/30/2022   12:28 PM 03/25/2022    2:16 PM  CBC  WBC 4.0 - 10.5 K/uL 4.6  4.4  6.5   Hemoglobin 13.0 - 17.0 g/dL 8.2  7.6  7.4   Hematocrit 39.0 - 52.0 % 25.0  24.3  23.0   Platelets 150 - 400 K/uL 156  184  300      CBG: Recent Labs  Lab 03/30/22 0456 03/31/22 0602 04/01/22 0559 04/02/22 0624 04/03/22 0459  GLUCAP 84 78 71 85 85    Brief HPI:   Adam Howell is a 62 y.o. male with history of HTN, T2DM with neuropathy, ESRD, RLE wound with progressive decline and ulcer to the bone with copious drainage and myositis who was admitted to [facility] on 03/06/2022 and started on IV antibiotics due to concerns of osteo.  He he underwent I&D with attempts at limb salvage.  Hospital course was  significant for transient AMS with episodes of flaccid LUE and decrease in responsiveness.  He was found to have 2 small infarcts acute or subacute in right cerebral white matter.  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  Gabapentin was DC'd and EEG done was negative for seizures.  He was also felt to have CO2 narcosis due to underlying sleep apnea with lack of dialysis x 5 days, infection as well as polypharmacy.  Vascular service recommended AKA and patient initially declined this however was agreeable to undergo said procedure on 12/29 by Dr. Lucky Cowboy.  Postop PT and OT have been working with patient who continues to be limited by deficits in mobility as well as ADLs.  CIR was recommended due to functional decline.Marland Kitchen   Hospital Course: Adam Howell was admitted to rehab 03/19/2022 for inpatient therapies to consist of PT and OT at least three hours five days a week. Past admission physiatrist,  therapy team and rehab RN have worked together to provide customized collaborative inpatient rehab.  His blood pressures were monitored on TID basis and have been reasonably controlled.  Nephrology has been following for management of hemodialysis MWF at the end of the day.  BiPAP was ordered however patient has refused this as he does not utilize this at home.  Heart rate has been controlled on amiodarone twice daily.  His blood sugars were monitored with ac/hs CBG checks and SSI was use prn for tighter BS control.  Blood sugars are controlled on diet alone.    Follow up CBC showed acute on chronic anemia that has been managed with ESA 100 mcg weekly as weill as IV iron supplement. Pain has been controlled with prn use of oxycodone and he was advised to wean down to home hydrocodone after discharge.  He was found to have sacral wounds that have a wall that is sloughing off of the top layer.  This  has been treated with Medihoney covered with dry dressing and daily wound care as well as pressure-relief measures.   His AKA incision is healing well without any signs or symptoms of infection.  He has been making gains during his rehab stay however he started showing decreased participation in therapy few days prior to discharge.  He currently requires supervision at wheelchair level and has been educated on home exercise plan.     Rehab course: During patient's stay in rehab weekly team conferences were held to monitor patient's progress, set goals and discuss barriers to discharge. At admission, patient required mod assist with ADL tasks and max assist with mobility. He  has had improvement in activity tolerance, balance, postural control as well as ability to compensate for deficits.  He is able to complete ADL tasks with supervision.  He requires contact-guard assist with cues for stand pivot transfers and min assist to ambulate 10 feet with standard walker. Family was unable to attend education and written resources were provided for them to refer to after discharge.   Wound care: Sacral MASD: Apply to fibro-necrotic areas of sacro-coccygeal wounds. Cover with foam dressing.  Change daily      Disposition: Home  Diet: Renal diet  Special Instructions: Continue pressure-relief measures when in chair.     Increase protein intake. Take Vitamin C and ZInc daily to promote wound healing 3.  Resume home dose hydrocodone after oxycodone has been used up.     Discharge Instructions     Ambulatory referral to Physical Medicine Rehab   Complete by: As directed    Hospital follow up      Allergies as of 04/03/2022       Reactions   Tramadol Rash        Medication List     STOP taking these medications    oxyCODONE-acetaminophen 5-325 MG tablet Commonly known as: PERCOCET/ROXICET Replaced by: oxyCODONE-acetaminophen 10-325 MG tablet       TAKE these medications     acetaminophen 325 MG tablet Commonly known as: TYLENOL Take 1-2 tablets (325-650 mg total) by mouth every 4 (four) hours as needed for mild pain.   amiodarone 200 MG tablet Commonly known as: PACERONE Take 1 tablet (200 mg total) by mouth 2 (two) times daily.   bumetanide 1 MG tablet Commonly known as: BUMEX Take 1 tablet (1 mg total) by mouth 2 (two) times daily.   calcitRIOL 0.5 MCG capsule Commonly known as: ROCALTROL Take 3 capsules (1.5 mcg total) by mouth  every Monday, Wednesday, and Friday at 6 PM.   cinacalcet 30 MG tablet Commonly known as: SENSIPAR Take 1 tablet (30 mg total) by mouth daily with breakfast.   Eliquis 2.5 MG Tabs tablet Generic drug: apixaban Take 1 tablet (2.5 mg total) by mouth 2 (two) times daily. What changed: additional instructions   hydrocerin Crea Apply 1 Application topically 2 (two) times daily. To foot   leptospermum manuka honey Pste paste Apply 1 Application topically daily. To areas on buttocks and cover with dry dressing. Change daily.   melatonin 5 MG Tabs Take 2 tablets (10 mg total) by mouth at bedtime as needed.   multivitamin Tabs tablet Take 1 tablet by mouth at bedtime.   oxyCODONE-acetaminophen 10-325 MG tablet--Rx# 21 pills Commonly known as: Percocet Take 1 tablet by mouth every 8 (eight) hours as needed for pain. Replaces: oxyCODONE-acetaminophen 5-325 MG tablet Notes to patient: Limit to three pills per day as needed--note that this is 10  mg pill. When this runs out you can resume your hydrocodone.   rosuvastatin 10 MG tablet Commonly known as: CRESTOR Take 1 tablet (10 mg total) by mouth daily.   senna 8.6 MG Tabs tablet Commonly known as: SENOKOT Take 2 tablets (17.2 mg total) by mouth at bedtime.   sevelamer carbonate 800 MG tablet Commonly known as: RENVELA Take 2 tablets (1,600 mg total) by mouth 3 (three) times daily.        Follow-up Information     Venita Lick, NP Follow up.    Specialty: Nurse Practitioner Why: Call in 1-2 days for post hospital follow up Contact information: [place] [address] [phone]         Algernon Huxley, MD Follow up.   Specialties: Vascular Surgery, Radiology, Interventional Cardiology Contact information: [place] [address] [phone]         Meredith Staggers, MD Follow up.   Specialty: Physical Medicine and Rehabilitation Why: office will call you with follow up appointment Contact information: [address] [place] [address] [phone]                 Signed: Bary Leriche 04/03/2022, 6:02 PM

## 2022-03-31 NOTE — Patient Care Conference (Signed)
Inpatient RehabilitationTeam Conference and Plan of Care Update Date: 03/31/2022   Time: 10:15 AM    Patient Name: Adam Howell      Medical Record Number: [id number]  Date of Birth: 05-Sep-1960 Sex: Male         Room/Bed: 4M11C/4M11C-01 Payor Info: Payor: [employer] MEDICARE / Plan: [place] HMO / Product Type: *No Product type* /    Admit Date/Time:  03/19/2022  1:08 PM  Primary Diagnosis:  Above knee amputation of right lower extremity [facility])  Hospital Problems: Principal Problem:   Above knee amputation of right lower extremity (HCC) Active Problems:   Anemia in chronic kidney disease   ESRD on dialysis ([place])   Hypertensive heart and kidney disease with heart failure and chronic kidney disease stage V ([place])   Type 2 diabetes mellitus with ESRD (end-stage renal disease) ([place])   Heart failure with reduced ejection fraction ([place])   Chronic atrial fibrillation with RVR ([place])   Pressure injury of skin    Expected Discharge Date: Expected Discharge Date: 04/04/22  Team Members Present: Physician leading conference: Dr. Alger Simons Social Worker Present: Loralee Pacas, [place] Nurse Present: Tacy Learn, RN PT Present: Tereasa Coop, PT OT Present: Mariane Masters, OT PPS Coordinator present : Gunnar Fusi, SLP     Current Status/Progress Goal Weekly Team Focus  Bowel/Bladder   oliguria-HD 3 times/week, Continent of BM. taking 2 senna daily   Continent of bowels.   Assist with toileting PRN, adjust bowel meds as needed.    Swallow/Nutrition/ Hydration               ADL's   CGA for BADL at sit to stand level, min-CGA squat pivot transfers, CGA for SPT with RW   supervision   endurance, UB strengthening, sit to stand, BADL strategies, lateral leans    Mobility   CGA to minA overall, gait in // bars x5' with CGA, supervision WC x150'   Supervision to [facility] for short distance ambulation, mod(I) WC mobility  global strengthening, endurance, increased  standing tolerance, gait training    Communication                Safety/Cognition/ Behavioral Observations               Pain   mostly complains of pain to right AKA, taking 2 PRN percocet Q 4hours. Rating pain between a 4 & 9.   Pain < 3 on pain scale   Assess qshift and medicate prior to therapy.    Skin   Right AKA with staples & stump shrinker, MASD with foam dressing to sacrum   Healing of AKA & MASD without infection. no breakdown to skin  Assess Qshift & PRN      Discharge Planning:  pt will d/c to home with dtr. DME ordered: hospital bed and w/c with [place]. Will confirm if therapies can be deferred until prosthesis. Pt dtr states pt is already set up with transportation for diaylsis and she will call to resume. Family education is pending daughter's follow-up about date/time. Pt dtr states pt will need transportation home. SW will explore primary insurance for discharge transportation options. SW will confirm there are no barriers to discharge.   Team Discussion: Right AKA. Dialysis M/W/F. Renal diet with 1200 FR. Daily weight. Continent B/B.  Pain to incision site controlled with PRN medication. Incision with staples, scant amount of SS drainage. Wears shrinker. MASD to buttocks. Stage II to right/left buttocks that was POA  Improvement with dressing changes.  Air mattress. Daily CBG okay.  Insomnia. Patient apprehensive about gait.  Therapy will continue to encourage strengthening  and endurance as will need if gets prosthesis.  Patient on target to meet rehab goals: yes, CGA bathing/dressing. CGA in paral bars.MinA-CGA squat pivot transfers. CGA for stand pivot with RW.  *See Care Plan and progress notes for long and short-term goals.   Revisions to Treatment Plan:  Monitor labs, monitor daily weight, monitor skin  Teaching Needs: Medications, skin/wound care, transfer training, safety, etc  Current Barriers to Discharge: Decreased caregiver support, Home  enviroment access/layout, Wound care, Hemodialysis, Weight bearing restrictions, and Behavior  Possible Resolutions to Barriers: Family education, nursing education, skin/wound care, order recommended DME     Medical Summary Current Status: pain controlled. sacral wound with evolution (not worsened). on hd for esrd. CBG's controlled  Barriers to Discharge: Medical stability   Possible Resolutions to [employer] Focus: daily assessment of labs, wounds, pt data   Continued Need for Acute Rehabilitation Level of Care: The patient requires daily medical management by a physician with specialized training in physical medicine and rehabilitation for the following reasons: Direction of a multidisciplinary physical rehabilitation program to maximize functional independence : Yes Medical management of patient stability for increased activity during participation in an intensive rehabilitation regime.: Yes Analysis of laboratory values and/or radiology reports with any subsequent need for medication adjustment and/or medical intervention. : Yes   I attest that I was present, lead the team conference, and concur with the assessment and plan of the team.   Ernest Pine 03/31/2022, 1:30 PM

## 2022-03-31 NOTE — Progress Notes (Signed)
Occupational Therapy Session Note  Patient Details  Name: Adam Howell MRN: [id number] Date of Birth: 10-27-1960  Today's Date: 03/31/2022 OT Individual Time: 0802-0900 OT Individual Time Calculation (min): 58 min    Short Term Goals:  Week 2:  OT Short Term Goal 1 (Week 2): STG=LTG d/t ELOS  Skilled Therapeutic Interventions/Progress Updates:   Pt seen for am self care session with report of not sleeping well this am thus getting up OOB and in w/c as well as sink side ADL's with NT earlier. No pain reported. Sock aide and reacher training to doff L sock. Completed skin inspection and skin protection with lotion application using cloth on reacher to L toes and foot. Trained to don L sock and slip on Croc with sock aide and reacher with min a fading to close s. OT managed w/c transport to and from apt for energy conservation but then pt self propelled in kitchen with S. Training for brief stand tolerance in kitchen at counter with CGA and then w/c level use of reacher training to obtain light food items from pantry closet shelf and open/close cabinets and drawers with close S. Back in room, OT trained and issued Red (medium) tband for B UE scap, sh, elbow 10 reps each plane with brief rest in between. Left pt w/c level with chair alarm set, nurse call button in reach and all needs on tray table.   Therapy Documentation Precautions:  Precautions Precautions: Fall Precaution Comments: s/p R AKA Restrictions Weight Bearing Restrictions: Yes RLE Weight Bearing: Non weight bearing    Therapy/Group: Individual Therapy  Barnabas Lister 03/31/2022, 7:46 AM

## 2022-04-01 DIAGNOSIS — S78111A Complete traumatic amputation at level between right hip and knee, initial encounter: Secondary | ICD-10-CM | POA: Diagnosis not present

## 2022-04-01 LAB — RENAL FUNCTION PANEL
Albumin: 2.6 g/dL — ABNORMAL LOW (ref 3.5–5.0)
Anion gap: 13 (ref 5–15)
BUN: 38 mg/dL — ABNORMAL HIGH (ref 6–20)
CO2: 25 mmol/L (ref 22–32)
Calcium: 7.5 mg/dL — ABNORMAL LOW (ref 8.9–10.3)
Chloride: 92 mmol/L — ABNORMAL LOW (ref 98–111)
Creatinine, Ser: 9.42 mg/dL — ABNORMAL HIGH (ref 0.61–1.24)
GFR, Estimated: 6 mL/min — ABNORMAL LOW (ref >60)
Glucose, Bld: 98 mg/dL (ref 70–99)
Phosphorus: 3.7 mg/dL (ref 2.5–4.6)
Potassium: 4.4 mmol/L (ref 3.5–5.1)
Sodium: 130 mmol/L — ABNORMAL LOW (ref 135–145)

## 2022-04-01 LAB — CBC
HCT: 25 % — ABNORMAL LOW (ref 39.0–52.0)
Hemoglobin: 8.2 g/dL — ABNORMAL LOW (ref 13.0–17.0)
MCH: 28.9 pg (ref 26.0–34.0)
MCHC: 32.8 g/dL (ref 30.0–36.0)
MCV: 88 fL (ref 80.0–100.0)
Platelets: 156 10*3/uL (ref 150–400)
RBC: 2.84 MIL/uL — ABNORMAL LOW (ref 4.22–5.81)
RDW: 18.5 % — ABNORMAL HIGH (ref 11.5–15.5)
WBC: 4.6 10*3/uL (ref 4.0–10.5)
nRBC: 0 % (ref 0.0–0.2)

## 2022-04-01 LAB — GLUCOSE, CAPILLARY: Glucose-Capillary: 71 mg/dL (ref 70–99)

## 2022-04-01 NOTE — Progress Notes (Signed)
PROGRESS NOTE   Subjective/Complaints: Woke up feeling nauseas this morning. Denied taking a medicine or eating anything which may have triggered.  ROS: Patient denies fever, rash, sore throat, blurred vision, dizziness,  vomiting, diarrhea, cough, shortness of breath or chest pain, joint or back/neck pain, headache, or mood change.    Objective:   No results found. Recent Labs    03/30/22 1228  WBC 4.4  HGB 7.6*  HCT 24.3*  PLT 184    Recent Labs    03/30/22 1228  NA 132*  K 4.0  CL 93*  CO2 24  GLUCOSE 88  BUN 42*  CREATININE 10.67*  CALCIUM 7.7*     Intake/Output Summary (Last 24 hours) at 04/01/2022 0835 Last data filed at 03/31/2022 1307 Gross per 24 hour  Intake 0 ml  Output --  Net 0 ml     Pressure Injury 03/19/22 Ischial tuberosity Right Stage 2 -  Partial thickness loss of dermis presenting as a shallow open injury with a red, pink wound bed without slough. (Active)  03/19/22 1400  Location: Ischial tuberosity  Location Orientation: Right  Staging: Stage 2 -  Partial thickness loss of dermis presenting as a shallow open injury with a red, pink wound bed without slough.  Wound Description (Comments):   Present on Admission: Yes     Pressure Injury 03/19/22 Ischial tuberosity Left Stage 2 -  Partial thickness loss of dermis presenting as a shallow open injury with a red, pink wound bed without slough. (Active)  03/19/22 1400  Location: Ischial tuberosity  Location Orientation: Left  Staging: Stage 2 -  Partial thickness loss of dermis presenting as a shallow open injury with a red, pink wound bed without slough.  Wound Description (Comments):   Present on Admission: Yes    Physical Exam: Vital Signs Blood pressure (!) 150/87, pulse 70, temperature 98 F (36.7 C), temperature source Oral, resp. rate 16, height 6\' 3"  (1.905 m), weight 108.9 kg, SpO2 96 %.  Constitutional: No distress . Vital  signs reviewed. HEENT: NCAT, EOMI, oral membranes moist Neck: supple Cardiovascular: RRR without murmur. No JVD    Respiratory/Chest: CTA Bilaterally without wheezes or rales. Normal effort    GI/Abdomen: BS +, non-tender, non-distended Ext: no clubbing, cyanosis, or edema Psych: generally pleasant and cooperative  Skin: AVF LUE, right AKA incision CDI with staples-- covered with shrinker, no drainage noted thru fabric.   Sacral wounds per below: 03/19/22   03/27/22  I examined these wounds today. There is reduction in fibronecrotic tissue by about 40-50%.      Neuro:  Alert and oriented x 3. Normal insight and awareness. Intact Memory. Normal language and speech. Cranial nerve exam unremarkable  Musculoskeletal: right residual limb with decreased swelling. Shrinker fitting appropriately, much less tender with touch   Assessment/Plan: 1. Functional deficits which require 3+ hours per day of interdisciplinary therapy in a comprehensive inpatient rehab setting. Physiatrist is providing close team supervision and 24 hour management of active medical problems listed below. Physiatrist and rehab team continue to assess barriers to discharge/monitor patient progress toward functional and medical goals  Care Tool:  Bathing    Body parts bathed  by patient: Right arm, Left arm, Chest, Abdomen, Front perineal area, Left upper leg, Face   Body parts bathed by helper: Left lower leg Body parts n/a: Right lower leg   Bathing assist Assist Level: Minimal Assistance - Patient > 75%     Upper Body Dressing/Undressing Upper body dressing   What is the patient wearing?: Pull over shirt    Upper body assist Assist Level: Set up assist    Lower Body Dressing/Undressing Lower body dressing      What is the patient wearing?: Pants     Lower body assist Assist for lower body dressing: Minimal Assistance - Patient > 75%     Toileting Toileting    Toileting assist Assist for  toileting: Maximal Assistance - Patient 25 - 49%     Transfers Chair/bed transfer  Transfers assist     Chair/bed transfer assist level: Moderate Assistance - Patient 50 - 74%     Locomotion Ambulation   Ambulation assist      Assist level: Moderate Assistance - Patient 50 - 74% Assistive device: Walker-rolling Max distance: 8   Walk 10 feet activity   Assist  Walk 10 feet activity did not occur: Safety/medical concerns        Walk 50 feet activity   Assist Walk 50 feet with 2 turns activity did not occur: Safety/medical concerns         Walk 150 feet activity   Assist Walk 150 feet activity did not occur: Safety/medical concerns         Walk 10 feet on uneven surface  activity   Assist Walk 10 feet on uneven surfaces activity did not occur: Safety/medical concerns         Wheelchair     Assist Is the patient using a wheelchair?: Yes Type of Wheelchair: Manual    Wheelchair assist level: Supervision/Verbal cueing Max wheelchair distance: 150    Wheelchair 50 feet with 2 turns activity    Assist        Assist Level: Supervision/Verbal cueing   Wheelchair 150 feet activity     Assist      Assist Level: Supervision/Verbal cueing   Blood pressure (!) 150/87, pulse 70, temperature 98 F (36.7 C), temperature source Oral, resp. rate 16, height 6\' 3"  (1.905 m), weight 108.9 kg, SpO2 96 %.  Medical Problem List and Plan: 1. Functional deficits secondary to R AKA in setting of non healing wound on RLE             -patient may  shower if covers R AKA incision             -ELOS/Goals: 18-21 days min A to supervision at w/c level  -Continue CIR therapies including PT, OT   2.  Antithrombotics: -DVT/anticoagulation:  Pharmaceutical: Eliquis 2.5mg  BID             -antiplatelet therapy: N/A 3. Pain Management:  Tylenol PRN, Oxycodone PRN.               -monitor for sedative SE. 4. Mood/Behavior/Sleep: LCSW to follow for  evaluation and support.             -melatonin 5mg  prn for insomnia. Will increase to 10mg              -antipsychotic agents: N/A 5. Neuropsych/cognition: This patient is capable of making decisions on his own behalf. 6. Skin/Wound Care: Right AKA healing nicely -03/27/22 sacral wounds--appears to be evolution of areas initially thought  to be skin tears. In hindsight, you can see non-viable superficial layer on initial photos which has since come off.   -1/17 sacral wounds with some improvement   -continue medihoney   -continue pressure relief, nutrition 7. Fluids/Electrolytes/Nutrition: Strict I/O w/1200 cc FR/Day  -labs with HD 8. T2DM: Hgb A1C-6.1 and diet controlled             -monitor BS ac/hs and use SSI for elevated BS. - extra sensitve SSI   -03/30/22 CBGs well controlled, continue to monitor   CBG (last 3)  Recent Labs    03/30/22 0456 03/31/22 0602 04/01/22 0559  GLUCAP 84 78 71    9. ESRD: HD MWF at the end of the day to help with tolerance of activity             -daily weights w/FR and renal diet  -04/01/22 stable, monitor Filed Weights   03/30/22 0444 03/30/22 1218 03/30/22 1633  Weight: 104 kg 109.3 kg 108.9 kg     10. OSA: continue BIPAP---pt refused. Says he doesn't use at home 11. Acute on anemia of chronic disease:              -03/28/22 hgb 7.4, stable  -aranesp and Fe infusions per nephrology  -asymptomatic, monitor 12. Chronic systolic CHF: Daily wt M/6004 cc FR/day. Continue Bumex 1mg  BID and Crestor 10mg  QD.  -Fluid status/weights managed with HD as above 13. Metabolic encephalopathy: improved, flat personality             -limit neuro sedating medications.  14. CAF: Monitor HR TID--continue Amiodarone 200mg  BID.   -03/31/22 HR well controlled, continue to monitor 15. Diarrhea: Related to milk products and has resolved. 16. VRE contact precautions - off of contact precautions given wound removal/AKA.     LOS: 13 days A FACE TO FACE EVALUATION WAS  PERFORMED  Meredith Staggers 04/01/2022, 8:35 AM   ,

## 2022-04-01 NOTE — Progress Notes (Signed)
Occupational Therapy Session Note  Patient Details  Name: Adam Howell MRN: [id number] Date of Birth: 1960/11/09  Today's Date: 04/01/2022 OT Individual Time: 0720-0730 OT Individual Time Calculation (min): 10 min  and Today's Date: 04/01/2022 OT Missed Time: 65 Minutes Missed Time Reason: Patient ill (comment) (n/v)  Today's Date: 04/01/2022 OT Individual Time: 5997-7414 OT Individual Time Calculation (min): 55 min   Short Term Goals: Week 2:  OT Short Term Goal 1 (Week 2): STG=LTG d/t ELOS  Skilled Therapeutic Interventions/Progress Updates:    Session 1: Pt received in bed with unrated pain. Pt reporting nausea. Discussed with RN who will bring medication while pt eating breakfast. Pt refuses to get into w/c or EOB for eating breakfast therefore misses 20 min skilled OT d/t meal at beginning of session. Rest, shower and repositioning provided for pain relief.  Upon return pt leaning over EOB vomiting into trashcan. Pt stating he was not able ot eat breakfast and only vomiting up saliva/stomach acid. Pt provided with sprite, repositioned in bed with MIN A and vitals assessed. See flowsheets. Pt reporting feeling too bad to complete tx. Pt missed 65 min skilled OT d/t being ill. Will attempt to make up later.   Pt left at end of session in bed with exit alarm on, call light in reach and all needs met  Session 2:  Pt received in w/c with 8 out of 10 pain in R residual limb. Rest and repositiong  provided for pain relief  Therapeutic exercise W/c propulsion to/from dayroom for BUE endurance/strengthening.   Pt completes 2x10-15 with 7# tidal tank therex for BUE shoulder strengthening required for BADLs/functional transfers as follows with demo cuing and assistance with recall of exercise order despite encouragement to remember himself. Therex completed in unsupported sitting with no foot on the floor to activate core musculature/challenge sitting balance.   Shoulder flex/ext,  chest press, curl to press, wood chops   Therapeutic activity Pt completes card sort/match on vertical board at sit to stand level with RW AD to maintain balance. Activity completed to improve standing tolerance, balance, address vision and cogntion all needed for BADL performance. Pt required CGA and min cuing to complete tasks with 2 mistakes. Pt needed 1 seated rest breaks throughout activity.    Pt left at end of session in bed with exit alarm on, call light in reach and all needs met   Attempted to make up missed time this afternoon, however pt in dialysis off unit.    Therapy Documentation Precautions:  Precautions Precautions: Fall Precaution Comments: s/p R AKA Restrictions Weight Bearing Restrictions: Yes RLE Weight Bearing: Non weight bearing General:   Therapy/Group: Individual Therapy  [name] 04/01/2022, 6:47 AM

## 2022-04-01 NOTE — Progress Notes (Signed)
Patient ID: [name], male   DOB: 1960-04-12, 62 y.o.   MRN: [id number]  SW received updates from [facility] reporting pt unable to get a BSC due to receiving one a few months ago. SW asked if this item can be provided since there is a change in his medical condition. Reports will give item and bill to insurance.   SW called pt dtr to provide updates from team team conference, inform on DME ordered, 24/7 care recommended, and SW will submit PCS referral . She confirms that all DME has been delivered except commode. SW informed on above about DABSC and item states delivered. SW informed there is a probability they could get a bill. She is aware that all [facility] therapies have been deferred until he gets prosthesis and pt is being sent home with HEP. SW informed will submit PCS referral. She states he already has PCS with [facility] in [place]. She is aware SW will follow-up with them. She is aware therapist will call her to give updates. She is aware SW will arrange transportation home through insurance on date of discharge per insurance and there is a 3hr p/u window.   1229-SW left message with [facility] [phone]) to inquire if pt is eligible for potentially more hours. SW waiting on follow-up.   Loralee Pacas, MSW, [place] Office: [phone] Cell: [phone] Fax: [phone]

## 2022-04-01 NOTE — Progress Notes (Signed)
Pt received in bed no c/os no distress stable for HD via LUA AVF UFG 2L

## 2022-04-01 NOTE — Progress Notes (Signed)
   04/01/22 1823  Vitals  Temp 98.3 F (36.8 C)  Pulse Rate 69  Resp (!) 22  BP (!) 159/89  SpO2 92 %  O2 Device Room Air  Weight 106 kg  Type of Weight Post-Dialysis  Post Treatment  Dialyzer Clearance Lightly streaked  Duration of HD Treatment -hour(s) 4 hour(s)  Hemodialysis Intake (mL) 0 mL  Liters Processed 96  Fluid Removed (mL) 2000 mL  Tolerated HD Treatment Yes  Post-Hemodialysis Comments Pt ran 4 hours, Uf goal met, Pt fistula ran with no complications  AVG/AVF Arterial Site Held (minutes) 5 minutes  AVG/AVF Venous Site Held (minutes) 5 minutes   Received patient in bed to unit.  Alert and oriented.  Informed consent signed and in chart.   Treatment initiated:  Treatment completed:   Patient tolerated well.  Transported back to the room  Alert, without acute distress.  Hand-off given to patient's nurse.   Access used: LAVF Access issues: none  Total UF removed: 2L Medication(s) given: ferrilicit 250mg     [facility]

## 2022-04-01 NOTE — Progress Notes (Signed)
[facility] Progress Note   Subjective:    Seen in room. Doing ok with therapy. No new concerns. For dialysis today   Objective Vitals:   03/31/22 0252 03/31/22 1430 03/31/22 1800 04/01/22 0335  BP: 131/79 (!) 144/80 (!) 153/83 (!) 150/87  Pulse: 67 64 66 70  Resp: 18 17 15 16   Temp: 97.8 F (36.6 C) (!) 97.3 F (36.3 C) 98.3 F (36.8 C) 98 F (36.7 C)  TempSrc: Oral   Oral  SpO2: 100% 100% 97% 96%  Weight:      Height:       Physical Exam General adult male in bed in no acute distress Lungs clear Heart S1S2 no rub Abdomen soft nontender Extremities no edema, Rt AKA Access LUE AVF + T/B, arterial site has thin/ pink skin, per pt improving--> may need OP attention re: revision  Filed Weights   03/30/22 0444 03/30/22 1218 03/30/22 1633  Weight: 104 kg 109.3 kg 108.9 kg    Intake/Output Summary (Last 24 hours) at 04/01/2022 1303 Last data filed at 03/31/2022 1307 Gross per 24 hour  Intake 0 ml  Output --  Net 0 ml     Additional Objective Labs: Basic Metabolic Panel: Recent Labs  Lab 03/25/22 1932 03/30/22 1228  NA 134* 132*  K 3.6 4.0  CL 95* 93*  CO2 29 24  GLUCOSE 89 88  BUN 17 42*  CREATININE 5.12* 10.67*  CALCIUM 7.9* 7.7*  PHOS 2.4* 3.9    Liver Function Tests: Recent Labs  Lab 03/25/22 1932 03/30/22 1228  ALBUMIN 2.3* 2.6*    No results for input(s): "LIPASE", "AMYLASE" in the last 168 hours. CBC: Recent Labs  Lab 03/25/22 1416 03/30/22 1228  WBC 6.5 4.4  HGB 7.4* 7.6*  HCT 23.0* 24.3*  MCV 89.1 91.4  PLT 300 184    Blood Culture    Component Value Date/Time   SDES  03/07/2022 0950    WOUND Performed at [facility], [address] [place], [place] [address]    [facility]  03/07/2022 0950    right lower extremity wound Performed at [facility], [address] [place], [place] [address]    CULT  03/07/2022 0950    FEW CITROBACTER KOSERI FEW ENTEROCOCCUS FAECIUM FEW PROTEUS  MIRABILIS VANCOMYCIN RESISTANT ENTEROCOCCUS ISOLATED NO ANAEROBES ISOLATED Performed at [facility], [place] [address], [place], [place] [address]    REPTSTATUS 03/12/2022 FINAL 03/07/2022 0950    Cardiac Enzymes: No results for input(s): "CKTOTAL", "CKMB", "CKMBINDEX", "TROPONINI" in the last 168 hours. CBG: Recent Labs  Lab 03/28/22 2112 03/29/22 0457 03/30/22 0456 03/31/22 0602 04/01/22 0559  GLUCAP 113* 94 84 78 71    Iron Studies: No results for input(s): "IRON", "TIBC", "TRANSFERRIN", "FERRITIN" in the last 72 hours. Lab Results  Component Value Date   INR 1.2 03/05/2022   Studies/Results: No results found.  Medications:  ferric gluconate (FERRLECIT) IVPB 250 mg (03/30/22 1518)    amiodarone  200 mg Oral BID   apixaban  2.5 mg Oral BID   bumetanide  1 mg Oral BID   calcitRIOL  1.5 mcg Oral Q M,W,F-1800   Chlorhexidine Gluconate Cloth  6 each Topical Q0600   cinacalcet  30 mg Oral Q breakfast   Darbepoetin Alfa  100 mcg Subcutaneous Q7 days   hydrocerin   Topical BID   leptospermum manuka honey  1 Application Topical Daily   multivitamin  1 tablet Oral QHS   rosuvastatin  10 mg Oral Daily   senna  2 tablet Oral QHS   sevelamer carbonate  1,600 mg Oral TID WC    Dialysis Orders: MWF [facility]  4.5h  113.6kg   450/800   2/2.5 bath   AVF 14ga x2  Hep 7500+ 3074midrun - mircera 100 q4 - sensipar 60 tiw - calcitriol 1.5 mcg po tiw  Assessment/Plan: Debility - on CIR SP R AKA  ESRD - on HD MWF. Next HD 04/01/22.  HTN - stable, normal to slightly high Vol - have lowered edw w/ AKA and vol removal is 8kg under now. Euvolemic, low UF goal w/ next HD. Lower EDW at discharge. Anemia esrd - Hb 7-8 range, getting esa here w/ darbe 100 ug w Friday. Tsat was 18% and IV Fe is ordered.  MBD ckd - CCa in range, Phos at goal Cont daily sensipar, tiw po vdra and renvela 3 ac tid as bidner.   Lynnda Child PA-C [facility] 04/01/2022,1:03 PM

## 2022-04-01 NOTE — Plan of Care (Signed)
Goals downgraded d/t progress with standing balance. SMS OT Problem: RH Toileting Goal: LTG Patient will perform toileting task (3/3 steps) with assistance level (OT) Description: LTG: Patient will perform toileting task (3/3 steps) with assistance level (OT)  Flowsheets (Taken 04/01/2022 1201) LTG: Pt will perform toileting task (3/3 steps) with assistance level: Contact Guard/Touching assist   Problem: RH Toilet Transfers Goal: LTG Patient will perform toilet transfers w/assist (OT) Description: LTG: Patient will perform toilet transfers with assist, with/without cues using equipment (OT) Flowsheets (Taken 04/01/2022 1201) LTG: Pt will perform toilet transfers with assistance level of: Contact Guard/Touching assist

## 2022-04-01 NOTE — Progress Notes (Signed)
Physical Therapy Session Note  Patient Details  Name: Adam Howell MRN: [id number] Date of Birth: [birth year]  Today's Date: 04/01/2022 PT Individual Time: 6767-2094 PT Individual Time Calculation (min): 53 min   Short Term Goals: Week 2:  PT Short Term Goal 1 (Week 2): STG=LTG due to ELOS.  Skilled Therapeutic Interventions/Progress Updates:     Pt received semi reclined in bed and agrees to therapy. No complaint of pain. Per OT, pt had been very nauseous and vomiting earlier this AM. Pt reports nausea has improved with medication. Supine to sit with bed features. Pt perform squat pivot to WC on the L with minA and cues for anterior trunk lean and positioning. Pt self propels WC x 115' with bilateral upper extremities to work on strengthening and mobility training. Pt performs squat pivot to Nustep with minA and cues for body mechanics and sequencing. Pt completes Nustep for endurance training. Pt completes x12:00 at workload of 6 with average steps per minute~45. PT provides cues for hand and foot placement and completing full available ROM. Pt performs squat pivot to WC with minA and cues for body mechanics and hand placement. Pt self propels WC x300' back to room. Left seated with all needs within reach.  Therapy Documentation Precautions:  Precautions Precautions: Fall Precaution Comments: s/p R AKA Restrictions Weight Bearing Restrictions: Yes RLE Weight Bearing: Non weight bearing    Therapy/Group: Individual Therapy  [name] Coons, PT, DPT 04/01/2022, 4:48 PM

## 2022-04-02 LAB — GLUCOSE, CAPILLARY: Glucose-Capillary: 85 mg/dL (ref 70–99)

## 2022-04-02 NOTE — Progress Notes (Signed)
PROGRESS NOTE   Subjective/Complaints: Nausea resolved on its own yesterday. Feels well this morning. Eating an omelet which he likes!  ROS: Patient denies fever, rash, sore throat, blurred vision, dizziness, nausea, vomiting, diarrhea, cough, shortness of breath or chest pain, joint or back/neck pain, headache, or mood change.    Objective:   No results found. Recent Labs    03/30/22 1228 04/01/22 1407  WBC 4.4 4.6  HGB 7.6* 8.2*  HCT 24.3* 25.0*  PLT 184 156    Recent Labs    03/30/22 1228 04/01/22 1407  NA 132* 130*  K 4.0 4.4  CL 93* 92*  CO2 24 25  GLUCOSE 88 98  BUN 42* 38*  CREATININE 10.67* 9.42*  CALCIUM 7.7* 7.5*     Intake/Output Summary (Last 24 hours) at 04/02/2022 0848 Last data filed at 04/01/2022 1823 Gross per 24 hour  Intake 240 ml  Output 2250 ml  Net -2010 ml     Pressure Injury 03/19/22 Ischial tuberosity Right Stage 2 -  Partial thickness loss of dermis presenting as a shallow open injury with a red, pink wound bed without slough. (Active)  03/19/22 1400  Location: Ischial tuberosity  Location Orientation: Right  Staging: Stage 2 -  Partial thickness loss of dermis presenting as a shallow open injury with a red, pink wound bed without slough.  Wound Description (Comments):   Present on Admission: Yes     Pressure Injury 03/19/22 Ischial tuberosity Left Stage 2 -  Partial thickness loss of dermis presenting as a shallow open injury with a red, pink wound bed without slough. (Active)  03/19/22 1400  Location: Ischial tuberosity  Location Orientation: Left  Staging: Stage 2 -  Partial thickness loss of dermis presenting as a shallow open injury with a red, pink wound bed without slough.  Wound Description (Comments):   Present on Admission: Yes    Physical Exam: Vital Signs Blood pressure (!) 153/79, pulse 71, temperature 99.3 F (37.4 C), temperature source Oral, resp. rate 16,  height 6\' 3"  (1.905 m), weight 106 kg, SpO2 99 %.  Constitutional: No distress . Vital signs reviewed. HEENT: NCAT, EOMI, oral membranes moist Neck: supple Cardiovascular: RRR without murmur. No JVD    Respiratory/Chest: CTA Bilaterally without wheezes or rales. Normal effort    GI/Abdomen: BS +, non-tender, non-distended Ext: no clubbing, cyanosis, or edema Psych: generally pleasant and cooperative  Skin: AVF LUE, right AKA incision CDI with staples-   Sacral wounds per below: 03/19/22   03/27/22  04/02/22 There is reduction in fibronecrotic tissue by about 40-50%.      Neuro:  Alert and oriented x 3. Normal insight and awareness. Intact Memory. Normal language and speech. Cranial nerve exam unremarkable. Moves all 4's.  Musculoskeletal: right residual limb with decreased swelling. Shrinker fitting appropriately, much less tender with touch   Assessment/Plan: 1. Functional deficits which require 3+ hours per day of interdisciplinary therapy in a comprehensive inpatient rehab setting. Physiatrist is providing close team supervision and 24 hour management of active medical problems listed below. Physiatrist and rehab team continue to assess barriers to discharge/monitor patient progress toward functional and medical goals  Care Tool:  Bathing    Body parts bathed by patient: Right arm, Left arm, Chest, Abdomen, Front perineal area, Left upper leg, Face   Body parts bathed by helper: Left lower leg Body parts n/a: Right lower leg   Bathing assist Assist Level: Minimal Assistance - Patient > 75%     Upper Body Dressing/Undressing Upper body dressing   What is the patient wearing?: Pull over shirt    Upper body assist Assist Level: Set up assist    Lower Body Dressing/Undressing Lower body dressing      What is the patient wearing?: Pants     Lower body assist Assist for lower body dressing: Minimal Assistance - Patient > 75%     Toileting Toileting    Toileting  assist Assist for toileting: Maximal Assistance - Patient 25 - 49%     Transfers Chair/bed transfer  Transfers assist     Chair/bed transfer assist level: Moderate Assistance - Patient 50 - 74%     Locomotion Ambulation   Ambulation assist      Assist level: Moderate Assistance - Patient 50 - 74% Assistive device: Walker-rolling Max distance: 8   Walk 10 feet activity   Assist  Walk 10 feet activity did not occur: Safety/medical concerns        Walk 50 feet activity   Assist Walk 50 feet with 2 turns activity did not occur: Safety/medical concerns         Walk 150 feet activity   Assist Walk 150 feet activity did not occur: Safety/medical concerns         Walk 10 feet on uneven surface  activity   Assist Walk 10 feet on uneven surfaces activity did not occur: Safety/medical concerns         Wheelchair     Assist Is the patient using a wheelchair?: Yes Type of Wheelchair: Manual    Wheelchair assist level: Supervision/Verbal cueing Max wheelchair distance: 150    Wheelchair 50 feet with 2 turns activity    Assist        Assist Level: Supervision/Verbal cueing   Wheelchair 150 feet activity     Assist      Assist Level: Supervision/Verbal cueing   Blood pressure (!) 153/79, pulse 71, temperature 99.3 F (37.4 C), temperature source Oral, resp. rate 16, height 6\' 3"  (1.905 m), weight 106 kg, SpO2 99 %.  Medical Problem List and Plan: 1. Functional deficits secondary to R AKA in setting of non healing wound on RLE             -patient may  shower if covers R AKA incision             -ELOS/Goals: 04/03/22, min A to supervision at w/c level  -Continue CIR therapies including PT, OT, and SLP   2.  Antithrombotics: -DVT/anticoagulation:  Pharmaceutical: Eliquis 2.5mg  BID             -antiplatelet therapy: N/A 3. Pain Management:  Tylenol PRN, Oxycodone PRN.               -monitor for sedative SE. 4.  Mood/Behavior/Sleep: LCSW to follow for evaluation and support.             -melatonin 5mg  prn for insomnia. Will increase to 10mg              -antipsychotic agents: N/A 5. Neuropsych/cognition: This patient is capable of making decisions on his own behalf. 6. Skin/Wound Care: Right AKA healing nicely -03/27/22 sacral  wounds--appears to be evolution of areas initially thought to be skin tears. In hindsight, you can see non-viable superficial layer on initial photos which has since come off.   -1/17-18 sacral wounds with some improvement   -continue medihoney   -continue pressure relief, nutrition 7. Fluids/Electrolytes/Nutrition: Strict I/O w/1200 cc FR/Day  -labs with HD 8. T2DM: Hgb A1C-6.1 and diet controlled             -monitor BS ac/hs and use SSI for elevated BS. - extra sensitve SSI   -03/30/22 CBGs well controlled, continue to monitor   CBG (last 3)  Recent Labs    03/31/22 0602 04/01/22 0559 04/02/22 0624  GLUCAP 78 71 85    9. ESRD: HD MWF at the end of the day to help with tolerance of activity             -daily weights w/FR and renal diet  -04/02/22 stable, monitor Filed Weights   03/30/22 1633 04/01/22 1408 04/01/22 1823  Weight: 108.9 kg 108 kg 106 kg     10. OSA: continue BIPAP---pt refused. Says he doesn't use at home 11. Acute on anemia of chronic disease:              -03/28/22 hgb 7.4, stable  -aranesp and Fe infusions per nephrology  -asymptomatic, monitor 12. Chronic systolic CHF: Daily wt J/1791 cc FR/day. Continue Bumex 1mg  BID and Crestor 10mg  QD.  -Fluid status/weights managed with HD as above 13. Metabolic encephalopathy: improved, flat personality             -limit neuro sedating medications.  14. CAF: Monitor HR TID--continue Amiodarone 200mg  BID.   -04/02/22 HR well controlled, continue to monitor 15. Diarrhea: Related to milk products and has resolved. 16. VRE contact precautions - off of contact precautions given wound removal/AKA.      LOS: 14 days A FACE TO FACE EVALUATION WAS PERFORMED  Meredith Staggers 04/02/2022, 8:48 AM   ,

## 2022-04-02 NOTE — Progress Notes (Signed)
Physical Therapy Session Note  Patient Details  Name: Adam Howell MRN: [id number] Date of Birth: 1960/07/18  Today's Date: 04/02/2022 PT Individual Time: 1116-1200 PT Individual Time Calculation (min): 44 min   Short Term Goals: Week 1:  PT Short Term Goal 1 (Week 1): Pt will transfer squat pivot w/ mod to min A consistently PT Short Term Goal 1 - Progress (Week 1): Met PT Short Term Goal 2 (Week 1): Pt will transfer sit to stand w/ min A PT Short Term Goal 2 - Progress (Week 1): Met PT Short Term Goal 3 (Week 1): Pt will amb x 10' w/ min A and RW. PT Short Term Goal 3 - Progress (Week 1): Progressing toward goal PT Short Term Goal 4 (Week 1): Pt will negotiate w/c x 100' w/ supervision PT Short Term Goal 4 - Progress (Week 1): Met  Skilled Therapeutic Interventions/Progress Updates: Pt presents supine in bed and reluctantly agreeable to therapy, stating 9/10 pain to R residual limb.  Pt transfers R sidelying w/ CGA, but reaching for assist 2/2 pain.  Pt performed squat pivot transfer w/ supervision.  Pt wheeled to small gym w/ supervision.  Pt transferred w/c > mat table w/ supervision after  removing arm rest.  Pt performed seated throwing/catching/bouncing ball w/ and w/o LE support, overhead, chest pass and underhand.  Pt performed w/ 2# weights to B wrists.  Pt performed overhead press w/ 4# weight 3 x 10 and chest press w/ resisted lat row w/o LE support.  Pt returned to w/c and wheeled to room w/ supervision.  Pt performed squat pivot transfer w/c> bed.  Pt remained in supine w/ all needs in reach.     Therapy Documentation Precautions:  Precautions Precautions: Fall Precaution Comments: s/p R AKA Restrictions Weight Bearing Restrictions: Yes RLE Weight Bearing: Non weight bearing General:   Vital Signs:  Pain:9/10   Mobility: Bed Mobility Supine to Sit: Independent with assistive device Sit to Supine: Independent with assistive device Transfers Transfers: Sit  to Stand;Stand to Sit;Stand Pivot Transfers;Squat Pivot Transfers Sit to Stand: Supervision/Verbal cueing Stand to Sit: Supervision/Verbal cueing Stand Pivot Transfers: Contact Guard/Touching assist Stand Pivot Transfer Details: Verbal cues for safe use of DME/AE;Manual facilitation for weight shifting;Verbal cues for precautions/safety Squat Pivot Transfers: Supervision/Verbal cueing Locomotion :    Trunk/Postural Assessment : Cervical Assessment Cervical Assessment: Within Functional Limits Thoracic Assessment Thoracic Assessment: Within Functional Limits Lumbar Assessment Lumbar Assessment: Within Functional Limits Postural Control Postural Control: Deficits on evaluation Trunk Control: decreased, improved since eval  Balance: Dynamic Sitting Balance Sitting balance - Comments: for seated ADLs, MOD I Static Standing Balance Static Standing - Level of Assistance: 5: Stand by assistance Exercises:   Other Treatments:      Therapy/Group: Individual Therapy  Adam Howell 04/02/2022, 12:48 PM

## 2022-04-02 NOTE — Progress Notes (Signed)
[facility] Progress Note   Subjective:    Seen in room. Had dialysis yesterday - no issues. Reports to discharge Saturday   Objective Vitals:   04/01/22 1800 04/01/22 1823 04/01/22 1913 04/02/22 0422  BP: (!) 166/84 (!) 159/89 (!) 168/83 (!) 153/79  Pulse: 69 69 67 71  Resp: 16 (!) 22 15 16   Temp:  98.3 F (36.8 C) 97.9 F (36.6 C) 99.3 F (37.4 C)  TempSrc:  Oral  Oral  SpO2: 97% 92% 97% 99%  Weight:  106 kg    Height:       Physical Exam General adult male in bed in no acute distress Lungs clear Heart S1S2 no rub Abdomen soft nontender Extremities no edema, Rt AKA Access LUE AVF + T/B, arterial site has thin/ pink skin, per pt improving--> may need OP attention re: revision  Filed Weights   03/30/22 1633 04/01/22 1408 04/01/22 1823  Weight: 108.9 kg 108 kg 106 kg    Intake/Output Summary (Last 24 hours) at 04/02/2022 1215 Last data filed at 04/02/2022 8676 Gross per 24 hour  Intake 476 ml  Output 2250 ml  Net -1774 ml     Additional Objective Labs: Basic Metabolic Panel: Recent Labs  Lab 03/30/22 1228 04/01/22 1407  NA 132* 130*  K 4.0 4.4  CL 93* 92*  CO2 24 25  GLUCOSE 88 98  BUN 42* 38*  CREATININE 10.67* 9.42*  CALCIUM 7.7* 7.5*  PHOS 3.9 3.7    Liver Function Tests: Recent Labs  Lab 03/30/22 1228 04/01/22 1407  ALBUMIN 2.6* 2.6*    No results for input(s): "LIPASE", "AMYLASE" in the last 168 hours. CBC: Recent Labs  Lab 03/30/22 1228 04/01/22 1407  WBC 4.4 4.6  HGB 7.6* 8.2*  HCT 24.3* 25.0*  MCV 91.4 88.0  PLT 184 156    Blood Culture    Component Value Date/Time   SDES  03/07/2022 0950    WOUND Performed at [facility], [address] Madelaine Bhat [place], [place] [address]    [facility]  03/07/2022 0950    right lower extremity wound Performed at [facility], [address] [place], [place] [address]    CULT  03/07/2022 0950    FEW CITROBACTER KOSERI FEW ENTEROCOCCUS FAECIUM FEW  PROTEUS MIRABILIS VANCOMYCIN RESISTANT ENTEROCOCCUS ISOLATED NO ANAEROBES ISOLATED Performed at [facility], [place] [address], [place], [place] [address]    REPTSTATUS 03/12/2022 FINAL 03/07/2022 0950    Cardiac Enzymes: No results for input(s): "CKTOTAL", "CKMB", "CKMBINDEX", "TROPONINI" in the last 168 hours. CBG: Recent Labs  Lab 03/29/22 0457 03/30/22 0456 03/31/22 0602 04/01/22 0559 04/02/22 0624  GLUCAP 94 84 78 71 85    Iron Studies: No results for input(s): "IRON", "TIBC", "TRANSFERRIN", "FERRITIN" in the last 72 hours. Lab Results  Component Value Date   INR 1.2 03/05/2022   Studies/Results: No results found.  Medications:    amiodarone  200 mg Oral BID   apixaban  2.5 mg Oral BID   bumetanide  1 mg Oral BID   calcitRIOL  1.5 mcg Oral Q M,W,F-1800   Chlorhexidine Gluconate Cloth  6 each Topical Q0600   cinacalcet  30 mg Oral Q breakfast   Darbepoetin Alfa  100 mcg Subcutaneous Q7 days   hydrocerin   Topical BID   leptospermum manuka honey  1 Application Topical Daily   multivitamin  1 tablet Oral QHS   rosuvastatin  10 mg Oral Daily   senna  2  tablet Oral QHS   sevelamer carbonate  1,600 mg Oral TID WC    Dialysis Orders: MWF [facility] Mince  4.5h  113.6kg   450/800   2/2.5 bath   AVF 14ga x2  Hep 7500+ 306midrun - mircera 100 q4 - sensipar 60 tiw - calcitriol 1.5 mcg po tiw  Assessment/Plan: Debility - on CIR SP R AKA  ESRD - on HD MWF. Next HD 1/19 HTN - stable, normal to slightly high Vol - have lowered edw w/ AKA and vol removal is 8kg under now. Euvolemic, low UF goal w/ next HD. Lower EDW at discharge. Anemia esrd - Hb 7-8 range, getting esa here w/ darbe 100 ug w Friday. Tsat was 18% and IV Fe is ordered.  MBD ckd - CCa in range, Phos at goal Cont daily sensipar, tiw po vdra and renvela 3 ac tid as bidner.   Lynnda Child PA-C [facility] 04/02/2022,12:15 PM

## 2022-04-02 NOTE — Progress Notes (Signed)
Contacted [facility] to remind staff that pt will d/c on Saturday and resume care on Monday. Clinic aware and agreeable.   Melven Sartorius Renal Navigator [phone]

## 2022-04-02 NOTE — Progress Notes (Signed)
Occupational Therapy Session Note  Patient Details  Name: Adam Howell MRN: [id number] Date of Birth: [birth year]  Today's Date: 04/02/2022 OT Individual Time: 0900-1000 OT Individual Time Calculation (min): 60 min   Today's Date: 04/02/2022 OT Individual Time: 1300-1340 OT Individual Time Calculation (min): 40 min   Short Term Goals: Week 2:  OT Short Term Goal 1 (Week 2): STG=LTG d/t ELOS  Skilled Therapeutic Interventions/Progress Updates:    Session 1: Pt received in bed with 8 out of 10 pain in R residual. Rest and repositioning provided for pain relief  ADL: Pt completes ADL at overall supervision Level. Skilled interventions include: increased time and cuing to allow pt to attempt to problem solve transfers but then providing safe suggestions for EOB<>w/c<>TTB in shower, bathing completed with set up at a seated level, only A for dressing with donning shrinker and cuing for use of sock aide technique. Increased time for lateral leans to advance pants pasthips. Instruciton on plastic bag taping method and cleaning shrinker in sink with non scented antibacterial soap. Encouragement needed to add second layer to shrinker to shape leg, but pt eventually agreeable.   Pt left at end of session in bed with exit alarm on, call light in reach and all needs met  Session 2: Pt received in bed with 8 out of 10 pain in R residual limb. repositioning provided for pain relief  Therapeutic Activity Provided pt with written exercise program as follows: reviewed with pt providing level 1 theraband. Pt homework to complete tonight and prepare any questions for this clinician in prep for DC home.  Access Code: [email or web address] URL: [email or web address] Date: 04/02/2022 Prepared by: Mariane Masters  Exercises - Standing Shoulder Horizontal Abduction with Resistance  - 1 x daily - 7 x weekly - 3 sets - 10 reps - 5 hold - Shoulder External Rotation and Scapular Retraction with  Resistance  - 1 x daily - 7 x weekly - 3 sets - 10 reps - Dynamic Hug with Resistance  - 1 x daily - 7 x weekly - 3 sets - 10 reps - 5 hold - Shoulder PNF D2 with Resistance  - 1 x daily - 7 x weekly - 3 sets - 10 reps - 5 hold - Seated Shoulder Flexion with Self-Anchored Resistance  - 1 x daily - 7 x weekly - 3 sets - 10 reps - Seated Shoulder Row with Anchored Resistance  - 1 x daily - 7 x weekly - 3 sets - 10 reps - Seated Shoulder Row with Resistance Anchored at Feet  - 1 x daily - 7 x weekly - 3 sets - 10 reps  Patient Education - Ice - [employer] time on phone with daughter Adam Howell. She is unable to come in for family education. Discussed how this would be the best mothod to get used to him moving/understanding his care needs but she has to work. Discussed DME, adaptive equipment, BADL strategies as well as mobility considerations. Oriented her to resource notebook with information on BADL/mobility, skin inspection, mirror therapy and desensitizaton techniques. Adam Howell has no further questions and ended call.   Pt left at end of session in bed with exit alarm on, call light in reach and all needs met    Therapy Documentation Precautions:  Precautions Precautions: Fall Precaution Comments: s/p R AKA Restrictions Weight Bearing Restrictions: Yes RLE Weight Bearing: Non weight bearing General:    Therapy/Group: Individual Therapy  Adam Howell 04/02/2022, 7:00 AM

## 2022-04-02 NOTE — Progress Notes (Addendum)
[name], male   DOB: 19-Jul-1960, 62 y.o.   MRN: [id number]  SW called [place] transportation confirming SW can schedule transportation for Saturday. SW can call and schedule tomorrow.   SW reminded pt dtr pt will d/c to home on Saturday and transportation will be scheduled tomorrow. SW also reminded her to set up transportation for dialysis on Monday.   SW received call from Ms. McDowell with [facility] confirming pt receives [facility] services and he gets 18.5 hrs per week. States pt is at max amount. States she spoke with his dtr about applying for CAP/DA. SW shared can submit application.   Loralee Pacas, MSW, [place] Office: [phone] Cell: [phone] Fax: [phone]

## 2022-04-02 NOTE — Progress Notes (Signed)
Physical Therapy Session Note  Patient Details  Name: Adam Howell MRN: [id number] Date of Birth: 1961-02-28  Today's Date: 04/02/2022 PT Individual Time:  -      Short Term Goals: Week 2:  PT Short Term Goal 1 (Week 2): STG=LTG due to ELOS.  Skilled Therapeutic Interventions/Progress Updates:     Pt received supine in bed with lights off. Pt shakes head "no" to therapy, saying he does not want to do anything but lay in bed. Pt does not give clear reason on why he does not want to participate in therapy, but when PT asks if he is in pain, pt says "yes". PT will follow up as able.   Therapy Documentation Precautions:  Precautions Precautions: Fall Precaution Comments: s/p R AKA Restrictions Weight Bearing Restrictions: Yes RLE Weight Bearing: Non weight bearing    Therapy/Group: Individual Therapy  [name] Coons, PT, DPT 04/02/2022, 4:13 PM

## 2022-04-03 ENCOUNTER — Other Ambulatory Visit (HOSPITAL_COMMUNITY): Payer: Self-pay

## 2022-04-03 LAB — GLUCOSE, CAPILLARY: Glucose-Capillary: 85 mg/dL (ref 70–99)

## 2022-04-03 MED ORDER — ROSUVASTATIN CALCIUM 10 MG PO TABS
10.0000 mg | ORAL_TABLET | Freq: Every day | ORAL | 0 refills | Status: DC
  Filled 2022-04-03: qty 30, 30d supply, fill #0

## 2022-04-03 MED ORDER — LIDOCAINE HCL (PF) 1 % IJ SOLN: 5.0000 mL | INTRAMUSCULAR | Status: DC | PRN

## 2022-04-03 MED ORDER — ELIQUIS 2.5 MG PO TABS: 2.5000 mg | ORAL_TABLET | Freq: Two times a day (BID) | ORAL | 0 refills | Status: DC

## 2022-04-03 MED ORDER — MELATONIN 5 MG PO TABS
10.0000 mg | ORAL_TABLET | Freq: Every evening | ORAL | 0 refills | Status: DC | PRN
  Filled 2022-04-03: qty 60, 30d supply, fill #0

## 2022-04-03 MED ORDER — MEDIHONEY WOUND/BURN DRESSING EX PSTE
1.0000 | PASTE | Freq: Every day | CUTANEOUS | 0 refills | Status: DC
  Filled 2022-04-03: qty 44, 44d supply, fill #0

## 2022-04-03 MED ORDER — HYDROCERIN EX CREA
1.0000 | TOPICAL_CREAM | Freq: Two times a day (BID) | CUTANEOUS | 0 refills | Status: DC
  Filled 2022-04-03: qty 454, 57d supply, fill #0

## 2022-04-03 MED ORDER — ELIQUIS 2.5 MG PO TABS
2.5000 mg | ORAL_TABLET | Freq: Two times a day (BID) | ORAL | 0 refills | Status: DC
  Filled 2022-04-03: qty 60, 30d supply, fill #0

## 2022-04-03 MED ORDER — HEPARIN SODIUM (PORCINE) 1000 UNIT/ML DIALYSIS
7000.0000 [IU] | INTRAMUSCULAR | Status: DC | PRN
  Administered 2022-04-03: 7000 [IU] via INTRAVENOUS_CENTRAL

## 2022-04-03 MED ORDER — SENNA 8.6 MG PO TABS
2.0000 | ORAL_TABLET | Freq: Every day | ORAL | 0 refills | Status: DC
  Filled 2022-04-03: qty 60, 30d supply, fill #0

## 2022-04-03 MED ORDER — RENA-VITE PO TABS
1.0000 | ORAL_TABLET | Freq: Every day | ORAL | 0 refills | Status: DC
  Filled 2022-04-03: qty 30, 30d supply, fill #0

## 2022-04-03 MED ORDER — ALTEPLASE 2 MG IJ SOLR: 2.0000 mg | Freq: Once | INTRAMUSCULAR | Status: DC | PRN

## 2022-04-03 MED ORDER — ANTICOAGULANT SODIUM CITRATE 4% (200MG/5ML) IV SOLN: 5.0000 mL | Status: DC | PRN

## 2022-04-03 MED ORDER — SEVELAMER CARBONATE 800 MG PO TABS
1600.0000 mg | ORAL_TABLET | Freq: Three times a day (TID) | ORAL | 0 refills | Status: DC
  Filled 2022-04-03: qty 360, 60d supply, fill #0

## 2022-04-03 MED ORDER — AMIODARONE HCL 200 MG PO TABS
200.0000 mg | ORAL_TABLET | Freq: Two times a day (BID) | ORAL | 0 refills | Status: DC
  Filled 2022-04-03: qty 60, 30d supply, fill #0

## 2022-04-03 MED ORDER — CALCITRIOL 0.5 MCG PO CAPS: 1.5000 ug | ORAL_CAPSULE | ORAL | Status: DC

## 2022-04-03 MED ORDER — HEPARIN SODIUM (PORCINE) 1000 UNIT/ML IJ SOLN
INTRAMUSCULAR | Status: AC
  Filled 2022-04-03: qty 7

## 2022-04-03 MED ORDER — CINACALCET HCL 30 MG PO TABS
30.0000 mg | ORAL_TABLET | Freq: Every day | ORAL | 0 refills | Status: DC
  Filled 2022-04-03: qty 30, 30d supply, fill #0

## 2022-04-03 MED ORDER — OXYCODONE-ACETAMINOPHEN 10-325 MG PO TABS
1.0000 | ORAL_TABLET | Freq: Three times a day (TID) | ORAL | 0 refills | Status: DC | PRN
  Filled 2022-04-03: qty 21, 7d supply, fill #0

## 2022-04-03 NOTE — Progress Notes (Signed)
PROGRESS NOTE   Subjective/Complaints: Pt states that left leg is itching a bit. Pain controlled. Appetite is excellent.   ROS: Patient denies fever, rash, sore throat, blurred vision, dizziness, nausea, vomiting, diarrhea, cough, shortness of breath or chest pain,  headache, or mood change.    Objective:   No results found. Recent Labs    04/01/22 1407  WBC 4.6  HGB 8.2*  HCT 25.0*  PLT 156    Recent Labs    04/01/22 1407  NA 130*  K 4.4  CL 92*  CO2 25  GLUCOSE 98  BUN 38*  CREATININE 9.42*  CALCIUM 7.5*     Intake/Output Summary (Last 24 hours) at 04/03/2022 1023 Last data filed at 04/02/2022 1901 Gross per 24 hour  Intake 177 ml  Output --  Net 177 ml     Pressure Injury 03/19/22 Ischial tuberosity Right Stage 2 -  Partial thickness loss of dermis presenting as a shallow open injury with a red, pink wound bed without slough. (Active)  03/19/22 1400  Location: Ischial tuberosity  Location Orientation: Right  Staging: Stage 2 -  Partial thickness loss of dermis presenting as a shallow open injury with a red, pink wound bed without slough.  Wound Description (Comments):   Present on Admission: Yes     Pressure Injury 03/19/22 Ischial tuberosity Left Stage 2 -  Partial thickness loss of dermis presenting as a shallow open injury with a red, pink wound bed without slough. (Active)  03/19/22 1400  Location: Ischial tuberosity  Location Orientation: Left  Staging: Stage 2 -  Partial thickness loss of dermis presenting as a shallow open injury with a red, pink wound bed without slough.  Wound Description (Comments):   Present on Admission: Yes    Physical Exam: Vital Signs Blood pressure 134/74, pulse 71, temperature 98.8 F (37.1 C), temperature source Oral, resp. rate 16, height 6\' 3"  (1.905 m), weight 105 kg, SpO2 97 %.  Constitutional: No distress . Vital signs reviewed. HEENT: NCAT, EOMI, oral  membranes moist Neck: supple Cardiovascular: RRR without murmur. No JVD    Respiratory/Chest: CTA Bilaterally without wheezes or rales. Normal effort    GI/Abdomen: BS +, non-tender, non-distended Ext: no clubbing, cyanosis, or edema Psych: pleasant and cooperative  Skin: AVF LUE, right AKA incision CDI with staples-   Sacral wounds per below: 03/19/22   03/27/22  04/03/22 There is reduction in fibronecrotic tissue by about 40-50% compared to picture above.      Neuro:  Alert and oriented x 3. Normal insight and awareness. Intact Memory. Normal language and speech. Cranial nerve exam unremarkable. Moves all 4's.  Musculoskeletal: right residual limb well shaped with decreased swelling. Shrinker fitting appropriately, much less tender with touch   Assessment/Plan: 1. Functional deficits which require 3+ hours per day of interdisciplinary therapy in a comprehensive inpatient rehab setting. Physiatrist is providing close team supervision and 24 hour management of active medical problems listed below. Physiatrist and rehab team continue to assess barriers to discharge/monitor patient progress toward functional and medical goals  Care Tool:  Bathing    Body parts bathed by patient: Right arm, Left arm, Chest, Abdomen, Front perineal  area, Left upper leg, Face, Left lower leg, Buttocks, Right upper leg   Body parts bathed by helper: Left lower leg Body parts n/a: Right lower leg   Bathing assist Assist Level: Set up assist     Upper Body Dressing/Undressing Upper body dressing   What is the patient wearing?: Pull over shirt    Upper body assist Assist Level: Set up assist    Lower Body Dressing/Undressing Lower body dressing      What is the patient wearing?: Pants     Lower body assist Assist for lower body dressing: Supervision/Verbal cueing     Toileting Toileting    Toileting assist Assist for toileting: Contact Guard/Touching assist     Transfers Chair/bed  transfer  Transfers assist     Chair/bed transfer assist level: Supervision/Verbal cueing     Locomotion Ambulation   Ambulation assist      Assist level: Moderate Assistance - Patient 50 - 74% Assistive device: Walker-rolling Max distance: 8   Walk 10 feet activity   Assist  Walk 10 feet activity did not occur: Safety/medical concerns        Walk 50 feet activity   Assist Walk 50 feet with 2 turns activity did not occur: Safety/medical concerns         Walk 150 feet activity   Assist Walk 150 feet activity did not occur: Safety/medical concerns         Walk 10 feet on uneven surface  activity   Assist Walk 10 feet on uneven surfaces activity did not occur: Safety/medical concerns         Wheelchair     Assist Is the patient using a wheelchair?: Yes Type of Wheelchair: Manual    Wheelchair assist level: Supervision/Verbal cueing Max wheelchair distance: 150    Wheelchair 50 feet with 2 turns activity    Assist        Assist Level: Supervision/Verbal cueing   Wheelchair 150 feet activity     Assist      Assist Level: Supervision/Verbal cueing   Blood pressure 134/74, pulse 71, temperature 98.8 F (37.1 C), temperature source Oral, resp. rate 16, height 6\' 3"  (1.905 m), weight 105 kg, SpO2 97 %.  Medical Problem List and Plan: 1. Functional deficits secondary to R AKA in setting of non healing wound on RLE             -patient may  shower if covers R AKA incision             -ELOS/Goals: 04/04/22, min A to supervision at w/c level  -Continue CIR therapies including PT, OT  2.  Antithrombotics: -DVT/anticoagulation:  Pharmaceutical: Eliquis 2.5mg  BID             -antiplatelet therapy: N/A 3. Pain Management:  Tylenol PRN, Oxycodone PRN.               -monitor for sedative SE. 4. Mood/Behavior/Sleep: LCSW to follow for evaluation and support.             -melatonin  10mg              -antipsychotic agents: N/A 5.  Neuropsych/cognition: This patient is capable of making decisions on his own behalf. 6. Skin/Wound Care: Right AKA healing nicely -03/27/22 sacral wounds--appears to be evolution of areas initially thought to be skin tears. In hindsight, you can see non-viable superficial layer on initial photos which has since come off.   -1/17-19 sacral wounds with some improvement   -  continue medihoney   -continue pressure relief, nutrition 7. Fluids/Electrolytes/Nutrition: Strict I/O w/1200 cc FR/Day  -labs with HD 8. T2DM: Hgb A1C-6.1               -- extra sensitve SSI   -04/03/22 CBGs well controlled with diet, continue to monitor   CBG (last 3)  Recent Labs    04/01/22 0559 04/02/22 0624 04/03/22 0459  GLUCAP 71 85 85    9. ESRD: HD MWF at the end of the day to help with tolerance of activity             -daily weights w/FR and renal diet  -04/03/22 stable, monitor Filed Weights   04/01/22 1408 04/01/22 1823 04/03/22 0500  Weight: 108 kg 106 kg 105 kg     10. OSA: continue BIPAP---pt refused. Says he doesn't use at home 11. Acute on anemia of chronic disease:              -04/01/22 hgb 8.2, stable/increased  -aranesp and Fe infusions per nephrology  -asymptomatic, monitor 12. Chronic systolic CHF: Daily wt M/8413 cc FR/day. Continue Bumex 1mg  BID and Crestor 10mg  QD.  -Fluid status/weights managed with HD as above 13. Metabolic encephalopathy: improved, flat personality             -limit neuro sedating medications.  14. CAF: Monitor HR TID--continue Amiodarone 200mg  BID.   -04/03/22 HR well controlled, continue to monitor 15. Diarrhea: Related to milk products and has resolved. 16. VRE contact precautions - off of contact precautions given wound removal/AKA.     LOS: 15 days A FACE TO FACE EVALUATION WAS PERFORMED  Meredith Staggers 04/03/2022, 10:23 AM   ,

## 2022-04-03 NOTE — Progress Notes (Signed)
RN received Pt back to 4MW alert and oriented. Vitals checked and updated in flowsheet. Pt provided supper tray and is currently eating. No acute distress noted.

## 2022-04-03 NOTE — Progress Notes (Addendum)
[name], male   DOB: 05-Aug-1960, 62 y.o.   MRN: [id number]  SW received updates from [facility] that hospital bed was not delivered as logistics called and there was no answer.   802- SW left message for [name] informing on above and encouraged her to follow-up with [facility] to discuss delivery.  [place] spoke with [name] to see if there were any updates about hospital bed. Reports they are delivering today. SW reiterated arranging transportation to home tomorrow for 10am pick up.   1506-SW spoke with Adam Howell/ [facility] [phone] to arranged transportation pick up for tomorrow at 10am to home.   SW informed pt assigned RN and charge nurse on above with regard to transportation. SW went by pt room and pt sleeping.   SW confirms w/c delivered to room.   *SW received message from [facility] stating pre-arranged transportation was cancelled and will need to call back tomorrow. SW informed assigned Therapist, sports and Agricultural consultant. SW will call tomorrow morning to schedule.   Loralee Pacas, MSW, [place] Office: [phone] Cell: [phone] Fax: [phone]

## 2022-04-03 NOTE — Progress Notes (Signed)
Occupational Therapy Discharge Summary  Patient Details  Name: Adam Howell MRN: [id number] Date of Birth: Apr 06, 1960  Date of Discharge from [facility] service:April 03, 2022   Patient has met 8 of 8 long term goals due to improved activity tolerance, improved balance, postural control, and ability to compensate for deficits.  Patient to discharge at overall Supervision level.  Patient's care partner was not physically present for family education despite encouargement to be present to learn how to assist especially for standing activities. Pt still can occasionally have small LOB in standing and with pivot transfers and daughter aware she should have hands on the patient whenever he is getting up to stand with the walker. Written resources provided for daughter to refer to. The patient has been increasingly disengaged with treatment towards the end of his stay but overall has improved to a supervision-CGA level for w/c level/EOB BADLs  Reasons goals not met: n/a  Recommendation:  Patient will benefit from ongoing skilled OT services in home health setting to continue to advance functional skills in the area of BADL, iADL, and Reduce care partner burden.  Equipment: DAC/TTB  Reasons for discharge: refusal of 3 consecutive treatment sessions without medical reason and discharge from hospital  Patient/family agrees with progress made and goals achieved: Yes  OT Discharge Precautions/Restrictions  Precautions Precautions: Fall Precaution Comments: s/p R AKA Restrictions Weight Bearing Restrictions: Yes RLE Weight Bearing: Non weight bearing General   Vital Signs Therapy Vitals Temp: 98.5 F (36.9 C) Temp Source: Oral Pulse Rate: 72 Resp: 16 BP: (!) 152/76 Patient Position (if appropriate): Lying Oxygen Therapy SpO2: 100 % O2 Device: Room Air Pain   ADL ADL Eating: Modified independent Where Assessed-Eating: Bed level Grooming: Modified independent Where  Assessed-Grooming: Sitting at sink Upper Body Bathing: Setup Where Assessed-Upper Body Bathing: Shower Lower Body Bathing: Setup Where Assessed-Lower Body Bathing: Shower Upper Body Dressing: Setup Where Assessed-Upper Body Dressing: Edge of bed Lower Body Dressing: Supervision/safety Where Assessed-Lower Body Dressing: Edge of bed Toileting: Contact guard Where Assessed-Toileting: Bedside Commode Toilet Transfer: Therapist, music Method: Stand pivot Science writer: Radiographer, therapeutic: Metallurgist Method: Stand pivot Tub/Shower Equipment: Radio broadcast assistant Vision Baseline Vision/History: 0 No visual deficits Patient Visual Report: No change from baseline Vision Assessment?: No apparent visual deficits Perception  Perception: Within Functional Limits Praxis Praxis: Intact Cognition Cognition Overall Cognitive Status: No family/caregiver present to determine baseline cognitive functioning Arousal/Alertness: Awake/alert Orientation Level: Person;Situation;Place Person: Oriented Place: Oriented Situation: Oriented Safety/Judgment: Appears intact Brief Interview for Mental Status (BIMS) Repetition of Three Words (First Attempt): 3 Temporal Orientation: Year: Correct Temporal Orientation: Month: Accurate within 5 days Recall: "Sock": Yes, no cue required Recall: "Blue": Yes, no cue required Recall: "Bed": Yes, no cue required BIMS Summary Score: 99 Sensation Sensation Light Touch: Appears Intact Coordination Gross Motor Movements are Fluid and Coordinated: Yes Fine Motor Movements are Fluid and Coordinated: Yes Motor  Motor Motor: Within Functional Limits Mobility  Bed Mobility Supine to Sit: Independent with assistive device Sit to Supine: Independent with assistive device Transfers Sit to Stand: Supervision/Verbal cueing Stand to Sit: Supervision/Verbal cueing  Trunk/Postural Assessment  Cervical  Assessment Cervical Assessment: Within Functional Limits Thoracic Assessment Thoracic Assessment: Within Functional Limits Lumbar Assessment Lumbar Assessment: Within Functional Limits Postural Control Postural Control: Deficits on evaluation Trunk Control: decreased, improved since eval  Balance Balance Balance Assessed: Yes Static Sitting Balance Static Sitting - Level of Assistance: 7: Independent Dynamic Sitting Balance Dynamic Sitting -  Level of Assistance: 7: Independent Static Standing Balance Static Standing - Balance Support: Bilateral upper extremity supported Static Standing - Level of Assistance: 5: Stand by assistance Dynamic Standing Balance Dynamic Standing - Balance Support: Bilateral upper extremity supported;During functional activity Dynamic Standing - Level of Assistance: 5: Stand by assistance;4: Min assist Extremity/Trunk Assessment RUE Assessment RUE Assessment: Within Functional Limits LUE Assessment LUE Assessment: Within Functional Limits   Adam Howell 04/03/2022, 3:26 PM

## 2022-04-03 NOTE — Progress Notes (Incomplete)
Inpatient Rehabilitation Discharge Medication Review by a Pharmacist  A complete drug regimen review was completed for this patient to identify any potential clinically significant medication issues.  High Risk Drug Classes Is patient taking? Indication by Medication  Antipsychotic {Receiving?:26196}   Anticoagulant {Receiving?:26196} Apixaban - afib/stroke ppx  Antibiotic {Receiving?:26196}   Opioid {Receiving?:26196}   Antiplatelet {Receiving?:26196}   Hypoglycemics/insulin {Yes or No?:26198}   Vasoactive Medication {Receiving?:26196} Amiodarone - Afib Bumetaninde - CHF  Chemotherapy {Receiving Chemo?:26197}   Other {Yes or No?:26198} Calcitriol, cinacalcet - ESRD hyperparathyroid tx Darbepoetin - ESRD anemia Medihoney - wound care MVI - supplement Rosuvastatin - HLD Senokot - constipation Renvela - phos binder      Type of Medication Issue Identified Description of Issue Recommendation(s)  Drug Interaction(s) (clinically significant)     Duplicate Therapy     Allergy     No Medication Administration End Date     Incorrect Dose     Additional Drug Therapy Needed     Significant med changes from prior encounter (inform family/care partners about these prior to discharge). Carvedilol, gabapentin, Norco, Imdur, nifedipine, sertraline, tizanidine stopped at inpatient discharge *** Communicate medication changes with patient/family at discharge  Other       Clinically significant medication issues were identified that warrant physician communication and completion of prescribed/recommended actions by midnight of the next day:  {Yes or No?:26198}  Name of provider notified for urgent issues identified: ***   Provider Method of Notification: ***    Pharmacist comments: ***   Time spent performing this drug regimen review (minutes): 20   Thank you for allowing pharmacy to be a part of this patient's care.  ***

## 2022-04-03 NOTE — Progress Notes (Signed)
Physical Therapy Discharge Summary  Patient Details  Name: Adam Howell MRN: [id number] Date of Birth: [birth year]  Date of Discharge from PT service:April 03, 2022  Today's Date: 04/03/2022 PT Individual Time: 0902-0959 PT Individual Time Calculation (min): 57 min    Patient has met 6 of 11 long term goals due to improved activity tolerance, improved balance, improved postural control, and increased strength.  Patient to discharge at a wheelchair level Supervision.   Patient's care partner was unable to attend formal family education but did discuss safe mobility recommendations with therapy staff and is  able  to provide the necessary physical assistance at discharge.  Reasons goals not met: Pt unwilling to attempt ambulation with RW for most of rehab stay, so was unable to meet ambulation goals. Pt also requiring verbal cues for use of WC, but is mobilizing at level that is adequate for safe discharge.   Recommendation:  Patient will benefit from ongoing skilled PT services in home health setting to continue to advance safe functional mobility, address ongoing impairments in strength, balance, transfers, ambulation, and minimize fall risk.  Equipment: 18" x 18" Manual WC  Reasons for discharge: treatment goals met and discharge from hospital  Patient/family agrees with progress made and goals achieved: Yes  Skilled Therapeutic Interventions: Pt received seated in [facility] and agrees to therapy. Reports pain is "not bad". PT provides education on residual limb desensitization, as well as rest breaks to manage pain. Pt self propels WC x 150' with bilateral upper extremities and cues for increased shoulder extension to increase power of propulsions. Pt performs car transfer with cues for sequencing and hand placement. For initial transfer pt requires minA to facilitate stability and anterior weight shifting. PT demonstrates foot placement and sequencing for improved ease of transfer and  pt performs additional car transfer with CGA and improved safety and independence. Following extended seated rest break, pt is agreeable to attempting ambulation with RW. Pt stands to RW with CGA and cues for hand placement and sequencing. Pt ambulates x10' with RW and minA, with cues for posture, RW management, and positioning for safe transfer to mat. PT has discussion with pt regarding mobility following discharge and importance of continuing to mobilize to prevent deconditioning. Pt provided with HEP and demonstrations of exercises. Pt then stands and ambulates x10' back to RW with same cueing and assistance. Pt self propels WC back to room. Left seated with all needs within reach.  PT Discharge Precautions/Restrictions Precautions Precautions: Fall Precaution Comments: s/p R AKA Restrictions Weight Bearing Restrictions: Yes RLE Weight Bearing: Non weight bearing Pain Pain Assessment Pain Score: 3  Pain Interference Pain Interference Pain Effect on Sleep: 4. Almost constantly Pain Interference with Therapy Activities: 0. Does not apply - I have not received rehabilitationtherapy in the past 5 days Pain Interference with Day-to-Day Activities: 4. Almost constantly Vision/Perception  Vision - History Ability to See in Adequate Light: 0 Adequate Perception Perception: Within Functional Limits Praxis Praxis: Intact  Cognition Overall Cognitive Status: No family/caregiver present to determine baseline cognitive functioning Arousal/Alertness: Awake/alert Orientation Level: Oriented X4 Safety/Judgment: Appears intact Sensation Sensation Light Touch: Appears Intact Coordination Gross Motor Movements are Fluid and Coordinated: Yes Fine Motor Movements are Fluid and Coordinated: Yes Motor  Motor Motor: Within Functional Limits  Mobility Bed Mobility Bed Mobility: Supine to Sit;Sit to Supine Supine to Sit: Independent with assistive device Sit to Supine: Independent with assistive  device Transfers Transfers: Sit to Stand;Stand to Sit;Stand Pivot Transfers;Squat Pivot Transfers Sit  to Stand: Supervision/Verbal cueing Stand to Sit: Supervision/Verbal cueing Stand Pivot Transfers: Contact Guard/Touching assist Stand Pivot Transfer Details: Verbal cues for safe use of DME/AE;Manual facilitation for weight shifting;Verbal cues for precautions/safety Squat Pivot Transfers: Supervision/Verbal cueing Locomotion  Gait Ambulation: Yes Gait Assistance: Minimal Assistance - Patient > 75% Gait Distance (Feet): 10 Feet Assistive device: Standard walker Gait Assistance Details: Verbal cues for technique;Verbal cues for precautions/safety;Tactile cues for posture Gait Gait: Yes Gait Pattern: Impaired Gait Pattern: Step-to pattern Gait velocity: decreased Stairs / Additional Locomotion Stairs: No Wheelchair Mobility Wheelchair Mobility: Yes Wheelchair Assistance: Chartered loss adjuster: Both upper extremities Wheelchair Parts Management: Needs assistance Distance: 200'  Trunk/Postural Assessment  Cervical Assessment Cervical Assessment: Within Functional Limits Thoracic Assessment Thoracic Assessment: Within Functional Limits Lumbar Assessment Lumbar Assessment: Within Functional Limits Postural Control Postural Control: Deficits on evaluation Trunk Control: decreased, improved since eval  Balance Balance Balance Assessed: Yes Static Sitting Balance Static Sitting - Level of Assistance: 7: Independent Dynamic Sitting Balance Dynamic Sitting - Level of Assistance: 7: Independent Static Standing Balance Static Standing - Balance Support: Bilateral upper extremity supported Static Standing - Level of Assistance: 5: Stand by assistance Dynamic Standing Balance Dynamic Standing - Balance Support: Bilateral upper extremity supported;During functional activity Dynamic Standing - Level of Assistance: 5: Stand by assistance Extremity  Assessment  RLE Assessment RLE Assessment: Exceptions to [facility] General Strength Comments: R hip grossly 3+/5 LLE Assessment LLE Assessment: Within Functional Limits General Strength Comments: grossly at least 4+/5   [name] Coons, PT, DPT 04/03/2022, 4:55 PM

## 2022-04-03 NOTE — TOC Transition Note (Signed)
Discharge medications (8) are being stored in the main pharmacy on the ground floor until patient is ready for discharge.   

## 2022-04-03 NOTE — Progress Notes (Signed)
Occupational Therapy Session Note  Patient Details  Name: Adam Howell MRN: [id number] Date of Birth: 09/13/1960  Today's Date: 04/03/2022 OT Individual Time: 1100-1155 OT Individual Time Calculation (min): 55 min    Short Term Goals: Week 2:  OT Short Term Goal 1 (Week 2): STG=LTG d/t ELOS  Skilled Therapeutic Interventions/Progress Updates: Educated regarding functional transfers and safety with squat pivot transfer. Patient with good understanding and states he feels ready to discharge home. Patient able to perform transfer with only supervision and a therapist's foot to prevent the w/c from scooting when returning to the wheel chair. Discussed floor surfaces and the importance of stopping the transfer if the chair scoot. Patient with good understanding. Continued treatment session with education on skin checks and skin care before and after being fit for a prosthetic. Worked on core strength and stability to improve transfer safety and prepare patient for prosthetic mobility. Patient able to return demo core and trunk stability exercises and reports feeling comfortable with current HEP. Continue with skilled OT services in the home upon discharge to better assess patient's ability to perform shower transfer once cleared by surgeon.      Therapy Documentation Precautions:  Precautions Precautions: Fall Precaution Comments: s/p R AKA Restrictions Weight Bearing Restrictions: Yes RLE Weight Bearing: Non weight bearing    Pain: Pain Assessment Pain Score: 3  ADL: ADL Eating: Modified independent Where Assessed-Eating: Bed level Grooming: Modified independent Where Assessed-Grooming: Sitting at sink Upper Body Bathing: Setup Where Assessed-Upper Body Bathing: Shower Lower Body Bathing: Setup Where Assessed-Lower Body Bathing: Shower Upper Body Dressing: Setup Where Assessed-Upper Body Dressing: Edge of bed Lower Body Dressing: Supervision/safety Where Assessed-Lower  Body Dressing: Edge of bed Toileting: Contact guard Where Assessed-Toileting: Bedside Commode Toilet Transfer: Therapist, music Method: Stand pivot Science writer: Bedside commode Tub/Shower Transfer: Metallurgist Method: Stand pivot Tub/Shower Equipment: Radio broadcast assistant Vision Baseline Vision/History: 0 No visual deficits Patient Visual Report: No change from baseline Perception  Perception: Within Functional Limits Praxis Praxis: Intact Balance Balance Balance Assessed: Yes Static Sitting Balance Static Sitting - Level of Assistance: 7: Independent Dynamic Sitting Balance Dynamic Sitting - Level of Assistance: 7: Independent Static Standing Balance Static Standing - Balance Support: Bilateral upper extremity supported Static Standing - Level of Assistance: 5: Stand by assistance Dynamic Standing Balance Dynamic Standing - Balance Support: Bilateral upper extremity supported;During functional activity Dynamic Standing - Level of Assistance: 5: Stand by assistance Exercises: Core and trunk stability exercises performed. 2 x 10 tolerated well at hi-low mat.    Therapy/Group: Individual Therapy  Hermina Barters 04/03/2022, 12:04 PM

## 2022-04-03 NOTE — Progress Notes (Signed)
Occupational Therapy Session Note  Patient Details  Name: Adam Howell MRN: [id number] Date of Birth: 05-21-1960  Today's Date: 04/03/2022 OT Individual Time: 8182-9937 OT Individual Time Calculation (min): 55 min    Short Term Goals: Week 2:  OT Short Term Goal 1 (Week 2): STG=LTG d/t ELOS  Skilled Therapeutic Interventions/Progress Updates:    Session 1: Pt missed 20 min skilled OT d/t eating breakfast as pt was asleep upon first entry and declined getting into chair or EOB for eating. Pt was assisted to scoot up to the [facility] and tray table set up. Upon return Pt received in bed with 8 out of 10 pain in R residual limb. Rest, repositioning and morning meds provided for pain relief. Pt declines BADL d/t having showered yesterday.  ADL: Tub transfer bench transfer via stand pivot with RW and CGA and cuing for hand placement to stand from TTB with RW as pt often attempting to stand with BUE on walker tipping walker back towards pt.   Therapeutic activity Education on energy conservation, mirror therapy, skin inspection, fall recovery/education and pain types. Pt actually engaged in education asking the occasional question and making eye contact. Pt verbalized understanding. Provided 2 types of mirror to desmonstrate techniques for skin inspection as well as mirror therapy.  Pt left at end of session in bed with exit alarm on, call light in reach and all needs met   Therapy Documentation Precautions:  Precautions Precautions: Fall Precaution Comments: s/p R AKA Restrictions Weight Bearing Restrictions: Yes RLE Weight Bearing: Non weight bearing General:   Therapy/Group: Individual Therapy  [name] 04/03/2022, 6:52 AM

## 2022-04-03 NOTE — Progress Notes (Signed)
[facility] Progress Note   Subjective:   Patient seen and examined in room.  No specific complaints.  Plans to discharge home tomorrow.  Admits to nausea in the AM but typically improves throughout the day.  Denies CP, SOB, abdominal pain and v/d.   Objective Vitals:   04/02/22 1922 04/03/22 0359 04/03/22 0500 04/03/22 1400  BP: 134/74 134/74  (!) 152/76  Pulse: 63 71  72  Resp: 16 16  16   Temp: 98.6 F (37 C) 98.8 F (37.1 C)  98.5 F (36.9 C)  TempSrc: Oral Oral  Oral  SpO2: 100% 97%  100%  Weight:   105 kg   Height:       Physical Exam General:well appearing male in NAD Heart:RRR, no mrg Lungs:CTAB, nml WOB on RA Abdomen:soft, NTND Extremities:no LE edema, R AKA Dialysis Access: LU AVF +b/t, arterial site has thinner pink skin  Filed Weights   04/01/22 1408 04/01/22 1823 04/03/22 0500  Weight: 108 kg 106 kg 105 kg    Intake/Output Summary (Last 24 hours) at 04/03/2022 1424 Last data filed at 04/03/2022 1325 Gross per 24 hour  Intake 475 ml  Output --  Net 475 ml    Additional Objective Labs: Basic Metabolic Panel: Recent Labs  Lab 03/30/22 1228 04/01/22 1407  NA 132* 130*  K 4.0 4.4  CL 93* 92*  CO2 24 25  GLUCOSE 88 98  BUN 42* 38*  CREATININE 10.67* 9.42*  CALCIUM 7.7* 7.5*  PHOS 3.9 3.7   Liver Function Tests: Recent Labs  Lab 03/30/22 1228 04/01/22 1407  ALBUMIN 2.6* 2.6*   CBC: Recent Labs  Lab 03/30/22 1228 04/01/22 1407  WBC 4.4 4.6  HGB 7.6* 8.2*  HCT 24.3* 25.0*  MCV 91.4 88.0  PLT 184 156   CBG: Recent Labs  Lab 03/30/22 0456 03/31/22 0602 04/01/22 0559 04/02/22 0624 04/03/22 0459  GLUCAP 84 78 71 85 85   Medications:   amiodarone  200 mg Oral BID   apixaban  2.5 mg Oral BID   bumetanide  1 mg Oral BID   calcitRIOL  1.5 mcg Oral Q M,W,F-1800   Chlorhexidine Gluconate Cloth  6 each Topical Q0600   cinacalcet  30 mg Oral Q breakfast   Darbepoetin Alfa  100 mcg Subcutaneous Q7 days   hydrocerin    Topical BID   leptospermum manuka honey  1 Application Topical Daily   multivitamin  1 tablet Oral QHS   rosuvastatin  10 mg Oral Daily   senna  2 tablet Oral QHS   sevelamer carbonate  1,600 mg Oral TID WC    Dialysis Orders: MWF [facility] [place] [facility]  4.5h  113.6kg   450/800   2/2.5 bath   AVF 14ga x2  Hep 7500+ 3073midrun - mircera 100 q4 - sensipar 60 tiw - calcitriol 1.5 mcg po tiw   Assessment/Plan: Debility - on CIR SP R AKA  ESRD - on HD MWF. Next HD 1/19 HTN - stable, normal to slightly high.   Vol - have lowered edw w/ AKA and vol removal is 8kg under now. Euvolemic, low UF goal w/ next HD. Lower EDW at discharge. Anemia esrd - Hb 7-8 range, getting esa here w/ darbe 100 mcg q Friday. Tsat was 18% and IV Fe is ordered.  MBD ckd - CCa in range, Phos at goal Cont daily sensipar, tiw po vdra and renvela 3 ac tid as bidner. Nutrition - Renal diet w/fluid restrictions 9. Dispo -  to d/c tomorrow.  Plans to resume regular scheduled OP HD next week.   Jen Mow, PA-C [facility] 04/03/2022,2:24 PM  LOS: 15 days

## 2022-04-04 LAB — CBC
HCT: 27.5 % — ABNORMAL LOW (ref 39.0–52.0)
Hemoglobin: 9 g/dL — ABNORMAL LOW (ref 13.0–17.0)
MCH: 29.1 pg (ref 26.0–34.0)
MCHC: 32.7 g/dL (ref 30.0–36.0)
MCV: 89 fL (ref 80.0–100.0)
Platelets: 182 10*3/uL (ref 150–400)
RBC: 3.09 MIL/uL — ABNORMAL LOW (ref 4.22–5.81)
RDW: 18.3 % — ABNORMAL HIGH (ref 11.5–15.5)
WBC: 4.5 10*3/uL (ref 4.0–10.5)
nRBC: 0 % (ref 0.0–0.2)

## 2022-04-04 LAB — RENAL FUNCTION PANEL
Albumin: 2.6 g/dL — ABNORMAL LOW (ref 3.5–5.0)
Anion gap: 10 (ref 5–15)
BUN: 17 mg/dL (ref 6–20)
CO2: 28 mmol/L (ref 22–32)
Calcium: 8 mg/dL — ABNORMAL LOW (ref 8.9–10.3)
Chloride: 94 mmol/L — ABNORMAL LOW (ref 98–111)
Creatinine, Ser: 5.65 mg/dL — ABNORMAL HIGH (ref 0.61–1.24)
GFR, Estimated: 11 mL/min — ABNORMAL LOW (ref >60)
Glucose, Bld: 133 mg/dL — ABNORMAL HIGH (ref 70–99)
Phosphorus: 2.2 mg/dL — ABNORMAL LOW (ref 2.5–4.6)
Potassium: 3.6 mmol/L (ref 3.5–5.1)
Sodium: 132 mmol/L — ABNORMAL LOW (ref 135–145)

## 2022-04-04 NOTE — Progress Notes (Signed)
Patient discharged by ambulance with discharge paperwork and TOC medicines. No questions or concerns at this time

## 2022-04-04 NOTE — Progress Notes (Signed)
[facility] Progress Note   Subjective:   Patient seen and examined at bedside.  Plan to d/c home today.  Denies CP, SOB, abdominal pain, stump pain and n/v/d.    Objective Vitals:   04/03/22 2041 04/03/22 2118 04/04/22 0425 04/04/22 0504  BP: (!) (P) 163/86 (!) 158/89 (!) 153/91   Pulse: 69 69 64   Resp: 20 18 18    Temp: 98.6 F (37 C) 98.2 F (36.8 C) 97.8 F (36.6 C)   TempSrc: Oral  Oral   SpO2: 100% 99% 100%   Weight:    105 kg  Height:       Physical Exam General:well appearing male in NAD Heart:RRR, no mrg Lungs:CTAB, nml WOB on RA Abdomen:soft, NTND Extremities:no LE edema, R BKA Dialysis Access: LU AVF+b/t   Filed Weights   04/03/22 0500 04/03/22 1543 04/04/22 0504  Weight: 105 kg 104 kg 105 kg    Intake/Output Summary (Last 24 hours) at 04/04/2022 1136 Last data filed at 04/03/2022 2230 Gross per 24 hour  Intake 420 ml  Output 2500 ml  Net -2080 ml    Additional Objective Labs: Basic Metabolic Panel: Recent Labs  Lab 03/30/22 1228 04/01/22 1407 04/03/22 2341  NA 132* 130* 132*  K 4.0 4.4 3.6  CL 93* 92* 94*  CO2 24 25 28   GLUCOSE 88 98 133*  BUN 42* 38* 17  CREATININE 10.67* 9.42* 5.65*  CALCIUM 7.7* 7.5* 8.0*  PHOS 3.9 3.7 2.2*   Liver Function Tests: Recent Labs  Lab 03/30/22 1228 04/01/22 1407 04/03/22 2341  ALBUMIN 2.6* 2.6* 2.6*   CBC: Recent Labs  Lab 03/30/22 1228 04/01/22 1407 04/03/22 2341  WBC 4.4 4.6 4.5  HGB 7.6* 8.2* 9.0*  HCT 24.3* 25.0* 27.5*  MCV 91.4 88.0 89.0  PLT 184 156 182   Blood Culture    Component Value Date/Time   SDES  03/07/2022 0950    WOUND Performed at [facility], [address] [place], [place] [address]    [facility]  03/07/2022 0950    right lower extremity wound Performed at [facility], [address] [place], [place] [address]    CULT  03/07/2022 0950    FEW CITROBACTER KOSERI FEW ENTEROCOCCUS FAECIUM FEW PROTEUS MIRABILIS VANCOMYCIN  RESISTANT ENTEROCOCCUS ISOLATED NO ANAEROBES ISOLATED Performed at [facility], [place] [address], [place], [place] [address]    REPTSTATUS 03/12/2022 FINAL 03/07/2022 0950    CBG: Recent Labs  Lab 03/30/22 0456 03/31/22 0602 04/01/22 0559 04/02/22 0624 04/03/22 0459  GLUCAP 84 78 71 85 85   Medications:   amiodarone  200 mg Oral BID   apixaban  2.5 mg Oral BID   bumetanide  1 mg Oral BID   calcitRIOL  1.5 mcg Oral Q M,W,F-1800   Chlorhexidine Gluconate Cloth  6 each Topical Q0600   cinacalcet  30 mg Oral Q breakfast   Darbepoetin Alfa  100 mcg Subcutaneous Q7 days   hydrocerin   Topical BID   leptospermum manuka honey  1 Application Topical Daily   multivitamin  1 tablet Oral QHS   rosuvastatin  10 mg Oral Daily   senna  2 tablet Oral QHS   sevelamer carbonate  1,600 mg Oral TID WC    Dialysis Orders: MWF [facility]  4.5h  113.6kg   450/800   2/2.5 bath   AVF 14ga x2  Hep 7500+ 3053midrun - mircera 100 q4 - sensipar 60 tiw - calcitriol  1.5 mcg po tiw   Assessment/Plan: Debility - on CIR SP R AKA  ESRD - on HD MWF. Next HD 1/22.  Thinning skin over LU AVF, would benefit from outpatient vascular evaluation.  Does not appear to have impending rupture.  HTN - stable, normal to slightly high.   Vol - have lowered edw w/ AKA and vol removal is 8kg under now. Euvolemic, low UF goal w/ next HD. Lower EDW at discharge. Anemia esrd - Hb ^9, getting esa here w/ darbe 100 mcg q Friday. Tsat was 18% and IV Fe is ordered.  MBD ckd - CCa in range, Phos at goal Cont daily sensipar, tiw po vdra and renvela 3 ac tid as bidner. Nutrition - Renal diet w/fluid restrictions 9. Dispo - d/c planned for today.  Will resume regular scheduled OP HD next week.  Jen Mow, PA-C [facility] 04/04/2022,11:36 AM  LOS: 16 days

## 2022-04-04 NOTE — Progress Notes (Signed)
PROGRESS NOTE   Subjective/Complaints: Pt doing well overnight and ready to go home today. Has discharge packet and understands everything that was gone over yesterday. Pain controlled, feeling well, having good BMs, and sleeping well.   ROS: Patient denies fever, CP, SOB, abd pain, nausea, vomiting, diarrhea, constipation, or other complaints.    Objective:   No results found. Recent Labs    04/01/22 1407 04/03/22 2341  WBC 4.6 4.5  HGB 8.2* 9.0*  HCT 25.0* 27.5*  PLT 156 182     Recent Labs    04/01/22 1407 04/03/22 2341  NA 130* 132*  K 4.4 3.6  CL 92* 94*  CO2 25 28  GLUCOSE 98 133*  BUN 38* 17  CREATININE 9.42* 5.65*  CALCIUM 7.5* 8.0*      Intake/Output Summary (Last 24 hours) at 04/04/2022 0701 Last data filed at 04/03/2022 2230 Gross per 24 hour  Intake 538 ml  Output 2500 ml  Net -1962 ml      Pressure Injury 03/19/22 Ischial tuberosity Right Stage 2 -  Partial thickness loss of dermis presenting as a shallow open injury with a red, pink wound bed without slough. (Active)  03/19/22 1400  Location: Ischial tuberosity  Location Orientation: Right  Staging: Stage 2 -  Partial thickness loss of dermis presenting as a shallow open injury with a red, pink wound bed without slough.  Wound Description (Comments):   Present on Admission: Yes     Pressure Injury 03/19/22 Ischial tuberosity Left Stage 2 -  Partial thickness loss of dermis presenting as a shallow open injury with a red, pink wound bed without slough. (Active)  03/19/22 1400  Location: Ischial tuberosity  Location Orientation: Left  Staging: Stage 2 -  Partial thickness loss of dermis presenting as a shallow open injury with a red, pink wound bed without slough.  Wound Description (Comments):   Present on Admission: Yes    Physical Exam: Vital Signs Blood pressure (!) 153/91, pulse 64, temperature 97.8 F (36.6 C), temperature  source Oral, resp. rate 18, height 6\' 3"  (1.905 m), weight 105 kg, SpO2 100 %.  Constitutional: No distress . Vital signs reviewed. HEENT: NCAT, EOMI, oral membranes moist Neck: supple Cardiovascular: RRR without murmur. No JVD    Respiratory/Chest: CTA Bilaterally without wheezes or rales. Normal effort    GI/Abdomen: BS +, non-tender, non-distended Ext: no clubbing, cyanosis, or edema Psych: pleasant and cooperative  Skin: AVF LUE, right AKA incision CDI with staples- covered with pant leg, no drainage through clothing.   Sacral wounds per below: 03/19/22   03/27/22  04/03/22 There is reduction in fibronecrotic tissue by about 40-50% compared to picture above.      Neuro:  Alert and oriented x 3. Normal insight and awareness. Intact Memory. Normal language and speech. Cranial nerve exam unremarkable. Moves all 4's.  Musculoskeletal: right residual limb well shaped with decreased swelling. Shrinker fitting appropriately, much less tender with touch   Assessment/Plan: 1. Functional deficits which require 3+ hours per day of interdisciplinary therapy in a comprehensive inpatient rehab setting. Physiatrist is providing close team supervision and 24 hour management of active medical problems listed below. Physiatrist and  rehab team continue to assess barriers to discharge/monitor patient progress toward functional and medical goals  Care Tool:  Bathing    Body parts bathed by patient: Right arm, Left arm, Chest, Abdomen, Front perineal area, Left upper leg, Face, Left lower leg, Buttocks, Right upper leg   Body parts bathed by helper: Left lower leg Body parts n/a: Right lower leg   Bathing assist Assist Level: Set up assist     Upper Body Dressing/Undressing Upper body dressing   What is the patient wearing?: Pull over shirt    Upper body assist Assist Level: Set up assist    Lower Body Dressing/Undressing Lower body dressing      What is the patient wearing?:  Pants     Lower body assist Assist for lower body dressing: Supervision/Verbal cueing     Toileting Toileting    Toileting assist Assist for toileting: Contact Guard/Touching assist     Transfers Chair/bed transfer  Transfers assist     Chair/bed transfer assist level: Supervision/Verbal cueing     Locomotion Ambulation   Ambulation assist      Assist level: Minimal Assistance - Patient > 75% Assistive device: Walker-rolling Max distance: 10'   Walk 10 feet activity   Assist  Walk 10 feet activity did not occur: Safety/medical concerns  Assist level: Minimal Assistance - Patient > 75% Assistive device: Walker-rolling   Walk 50 feet activity   Assist Walk 50 feet with 2 turns activity did not occur: Safety/medical concerns         Walk 150 feet activity   Assist Walk 150 feet activity did not occur: Safety/medical concerns         Walk 10 feet on uneven surface  activity   Assist Walk 10 feet on uneven surfaces activity did not occur: Safety/medical concerns         Wheelchair     Assist Is the patient using a wheelchair?: Yes Type of Wheelchair: Manual    Wheelchair assist level: Supervision/Verbal cueing Max wheelchair distance: 150'    Wheelchair 50 feet with 2 turns activity    Assist        Assist Level: Supervision/Verbal cueing   Wheelchair 150 feet activity     Assist      Assist Level: Supervision/Verbal cueing   Blood pressure (!) 153/91, pulse 64, temperature 97.8 F (36.6 C), temperature source Oral, resp. rate 18, height 6\' 3"  (1.905 m), weight 105 kg, SpO2 100 %.  Medical Problem List and Plan: 1. Functional deficits secondary to R AKA in setting of non healing wound on RLE             -patient may  shower if covers R AKA incision             -ELOS/Goals: 04/04/22, min A to supervision at w/c level  -Continue CIR therapies including PT, OT   -D/C today 04/04/22 2.   Antithrombotics: -DVT/anticoagulation:  Pharmaceutical: Eliquis 2.5mg  BID             -antiplatelet therapy: N/A 3. Pain Management:  Tylenol PRN, Oxycodone PRN.               -monitor for sedative SE. 4. Mood/Behavior/Sleep: LCSW to follow for evaluation and support.             -melatonin  10mg              -antipsychotic agents: N/A 5. Neuropsych/cognition: This patient is capable of making decisions on his  own behalf. 6. Skin/Wound Care: Right AKA healing nicely -03/27/22 sacral wounds--appears to be evolution of areas initially thought to be skin tears. In hindsight, you can see non-viable superficial layer on initial photos which has since come off.   -1/17-19 sacral wounds with some improvement   -continue medihoney   -continue pressure relief, nutrition 7. Fluids/Electrolytes/Nutrition: Strict I/O w/1200 cc FR/Day  -labs with HD 8. T2DM: Hgb A1C-6.1               -- extra sensitve SSI   -04/04/22 CBGs well controlled with diet, continue to monitor at home   CBG (last 3)  Recent Labs    04/02/22 0624 04/03/22 0459  GLUCAP 85 85     9. ESRD: HD MWF at the end of the day to help with tolerance of activity             -daily weights w/FR and renal diet  -04/04/22 stable, monitor at home [facility] Weights   04/03/22 0500 04/03/22 1543 04/04/22 0504  Weight: 105 kg 104 kg 105 kg     10. OSA: continue BIPAP---pt refused. Says he doesn't use at home 11. Acute on anemia of chronic disease:              -04/03/22 hgb 9.0, stable/increased  -aranesp and Fe infusions per nephrology  -asymptomatic, monitor outpatient 12. Chronic systolic CHF: Daily wt E/5277 cc FR/day. Continue Bumex 1mg  BID and Crestor 10mg  QD.  -Fluid status/weights managed with HD as above 13. Metabolic encephalopathy: improved, flat personality             -limit neuro sedating medications.  14. CAF: Monitor HR TID--continue Amiodarone 200mg  BID.   -04/04/22 HR well controlled, continue to monitor at home 15.  Diarrhea: Related to milk products and has resolved. 16. VRE contact precautions - off of contact precautions given wound removal/AKA.     LOS: 16 days A FACE TO [place] 04/04/2022, 7:01 AM   ,

## 2022-04-05 NOTE — Progress Notes (Signed)
Nursing care plan and education completed for discharge to home via transport

## 2022-04-06 ENCOUNTER — Telehealth: Payer: Self-pay

## 2022-04-06 DIAGNOSIS — Z992 Dependence on renal dialysis: Secondary | ICD-10-CM | POA: Diagnosis not present

## 2022-04-06 DIAGNOSIS — N2581 Secondary hyperparathyroidism of renal origin: Secondary | ICD-10-CM | POA: Diagnosis not present

## 2022-04-06 DIAGNOSIS — N186 End stage renal disease: Secondary | ICD-10-CM | POA: Diagnosis not present

## 2022-04-06 NOTE — Progress Notes (Signed)
Inpatient Rehabilitation Care Coordinator Discharge Note   Patient Details  Name: Adam Howell MRN: [id number] Date of Birth: [birth year]   Discharge location: D/c to home with dtr  Length of Stay: 15 days  Discharge activity level: Supervision  Home/community participation: Limited  Patient response IX:VEZBMZ Literacy - How often do you need to have someone help you when you read instructions, pamphlets, or other written material from your doctor or pharmacy?: Never  Patient response TA:EWYBRK Isolation - How often do you feel lonely or isolated from those around you?: Never  Services provided included: MD, RD, PT, OT, SLP, RN, TR, Pharmacy, Neuropsych, SW, CM  Financial Services:  Charity fundraiser Utilized: [place] [employer]  Choices offered to/list presented to: N/A- discussed therapies deferred  Follow-up services arranged:  Home Health, DME, Patient/Family has no preference for HH/DME agencies [place]: Therapies deferred until getting prosthesis    DME : [place] for hospital bed, wheelchair, drop arm bedside commode, and tub transfer bench    Patient response to transportation need: Is the patient able to respond to transportation needs?: Yes In the past 12 months, has lack of transportation kept you from medical appointments or from getting medications?: No In the past 12 months, has lack of transportation kept you from meetings, work, or from getting things needed for daily living?: No   Comments (or additional information):  Patient/Family verbalized understanding of follow-up arrangements:  Yes  Individual responsible for coordination of the follow-up plan: contact pt [phone] or [name] [phone]  Confirmed correct DME delivered: Adam Howell 04/06/2022    Adam Howell

## 2022-04-06 NOTE — Progress Notes (Signed)
Inpatient Rehabilitation Discharge Medication Review by a Pharmacist   A complete drug regimen review was completed for this patient to identify any potential clinically significant medication issues.   High Risk Drug Classes Is patient taking? Indication by Medication  Antipsychotic No    Anticoagulant Yes Apixaban - afib/stroke ppx  Antibiotic No    Opioid Yes  Percocet - pain  Antiplatelet No    Hypoglycemics/insulin No    Vasoactive Medication Yes Amiodarone - Afib Bumetaninde - CHF  Chemotherapy No    Other Yes Calcitriol, cinacalcet - ESRD hyperparathyroid tx Darbepoetin - ESRD anemia Medihoney - wound care MVI - supplement Rosuvastatin - HLD Senokot - constipation Renvela - phos binder  Tylenol - pain Melatonin - sleep        Type of Medication Issue Identified Description of Issue Recommendation(s)  Drug Interaction(s) (clinically significant)        Duplicate Therapy        Allergy        No Medication Administration End Date        Incorrect Dose        Additional Drug Therapy Needed        Significant med changes from prior encounter (inform family/care partners about these prior to discharge). Carvedilol, gabapentin, Norco, Imdur, nifedipine, sertraline, tizanidine stopped at inpatient discharge Consider resuming on discharge if warranted  Other            Clinically significant medication issues were identified that warrant physician communication and completion of prescribed/recommended actions by midnight of the next day:  No   Name of provider notified for urgent issues identified:    Provider Method of Notification:      Pharmacist comments:      Time spent performing this drug regimen review (minutes): [place], PharmD, BCPS Clinical Pharmacist Clinical phone for 04/06/2022 is [phone] 04/06/2022 11:58 AM

## 2022-04-06 NOTE — Progress Notes (Signed)
Late Entry Note:  Last renal note faxed to clinic this morning for continuation of care. D/C summary not available at this time to fax to clinic.   Melven Sartorius Renal Navigator [phone]

## 2022-04-06 NOTE — Telephone Encounter (Signed)
Transition Care Management Unsuccessful Follow-up Telephone Call  Date of discharge and from where:  [facility] 04/04/2022  Attempts:  1st Attempt  Reason for unsuccessful TCM follow-up call:  No answer/busy Juanda Crumble, [place] Direct Dial [phone]

## 2022-04-07 NOTE — Telephone Encounter (Signed)
Transition Care Management Unsuccessful Follow-up Telephone Call  Date of discharge and from where:  [facility] 04/04/2022  Attempts:  2nd Attempt  Reason for unsuccessful TCM follow-up call:  Left voice message Juanda Crumble, [place] Direct Dial [phone]

## 2022-04-08 ENCOUNTER — Inpatient Hospital Stay: Payer: Medicare HMO | Admitting: Nurse Practitioner

## 2022-04-08 DIAGNOSIS — Z992 Dependence on renal dialysis: Secondary | ICD-10-CM | POA: Diagnosis not present

## 2022-04-08 DIAGNOSIS — N186 End stage renal disease: Secondary | ICD-10-CM | POA: Diagnosis not present

## 2022-04-08 DIAGNOSIS — N2581 Secondary hyperparathyroidism of renal origin: Secondary | ICD-10-CM | POA: Diagnosis not present

## 2022-04-09 NOTE — Telephone Encounter (Signed)
Transition Care Management Unsuccessful Follow-up Telephone Call  Date of discharge and from where:  [facility] 04/04/2022  Attempts:  3rd Attempt  Reason for unsuccessful TCM follow-up call:  No answer/busy Juanda Crumble, [place] Direct Dial [phone]

## 2022-04-10 DIAGNOSIS — N186 End stage renal disease: Secondary | ICD-10-CM | POA: Diagnosis not present

## 2022-04-10 DIAGNOSIS — N2581 Secondary hyperparathyroidism of renal origin: Secondary | ICD-10-CM | POA: Diagnosis not present

## 2022-04-10 DIAGNOSIS — Z992 Dependence on renal dialysis: Secondary | ICD-10-CM | POA: Diagnosis not present

## 2022-04-13 ENCOUNTER — Ambulatory Visit (INDEPENDENT_AMBULATORY_CARE_PROVIDER_SITE_OTHER): Payer: Medicare HMO | Admitting: Family Medicine

## 2022-04-13 ENCOUNTER — Encounter: Payer: Self-pay | Admitting: Family Medicine

## 2022-04-13 VITALS — BP 166/103 | HR 80 | Temp 98.1°F

## 2022-04-13 DIAGNOSIS — E1122 Type 2 diabetes mellitus with diabetic chronic kidney disease: Secondary | ICD-10-CM

## 2022-04-13 DIAGNOSIS — L89159 Pressure ulcer of sacral region, unspecified stage: Secondary | ICD-10-CM

## 2022-04-13 DIAGNOSIS — D6869 Other thrombophilia: Secondary | ICD-10-CM

## 2022-04-13 DIAGNOSIS — Z89611 Acquired absence of right leg above knee: Secondary | ICD-10-CM

## 2022-04-13 DIAGNOSIS — G894 Chronic pain syndrome: Secondary | ICD-10-CM | POA: Diagnosis not present

## 2022-04-13 DIAGNOSIS — Z992 Dependence on renal dialysis: Secondary | ICD-10-CM

## 2022-04-13 DIAGNOSIS — I132 Hypertensive heart and chronic kidney disease with heart failure and with stage 5 chronic kidney disease, or end stage renal disease: Secondary | ICD-10-CM

## 2022-04-13 DIAGNOSIS — N185 Chronic kidney disease, stage 5: Secondary | ICD-10-CM | POA: Diagnosis not present

## 2022-04-13 DIAGNOSIS — I482 Chronic atrial fibrillation, unspecified: Secondary | ICD-10-CM

## 2022-04-13 DIAGNOSIS — M726 Necrotizing fasciitis: Secondary | ICD-10-CM | POA: Diagnosis not present

## 2022-04-13 DIAGNOSIS — I5020 Unspecified systolic (congestive) heart failure: Secondary | ICD-10-CM

## 2022-04-13 DIAGNOSIS — N2581 Secondary hyperparathyroidism of renal origin: Secondary | ICD-10-CM | POA: Diagnosis not present

## 2022-04-13 DIAGNOSIS — N186 End stage renal disease: Secondary | ICD-10-CM

## 2022-04-13 DIAGNOSIS — D631 Anemia in chronic kidney disease: Secondary | ICD-10-CM

## 2022-04-13 LAB — BAYER DCA HB A1C WAIVED: HB A1C (BAYER DCA - WAIVED): 4.7 % — ABNORMAL LOW (ref 4.8–5.6)

## 2022-04-13 MED ORDER — HYDROCODONE-ACETAMINOPHEN 10-325 MG PO TABS: 1.0000 | ORAL_TABLET | Freq: Three times a day (TID) | ORAL | 0 refills | Status: DC | PRN

## 2022-04-13 MED ORDER — ROSUVASTATIN CALCIUM 10 MG PO TABS: 10.0000 mg | ORAL_TABLET | Freq: Every day | ORAL | 1 refills | Status: DC

## 2022-04-13 MED ORDER — ELIQUIS 2.5 MG PO TABS: 2.5000 mg | ORAL_TABLET | Freq: Two times a day (BID) | ORAL | 0 refills | Status: DC

## 2022-04-13 NOTE — Assessment & Plan Note (Signed)
Will get him refill on his medicine for 1 month. Follow up with PCP. Referral to pain management placed today.

## 2022-04-13 NOTE — Assessment & Plan Note (Signed)
Continue to follow with nephrology. Continue dialysis. Cal with any concerns.

## 2022-04-13 NOTE — Assessment & Plan Note (Signed)
Continue to follow with nephrology. Continue to monitor. Call with any concerns.

## 2022-04-13 NOTE — Assessment & Plan Note (Addendum)
To continue eliquis and amiodarone. Has not seen cardiology in a long time- will get him back in to see them. Referral generated today.

## 2022-04-13 NOTE — Progress Notes (Signed)
BP (!) 166/103   Pulse 80   Temp 98.1 F (36.7 C) (Oral)    Subjective:    [name], male    DOB: [birth year], 61 y.o.   MRN: [id number]  HPI: Adam Howell is a 62 y.o. male  Chief Complaint  Patient presents with   Hosptial Follow up   Transition of [facility] Follow up.   Hospital/Facility: [facility] then [facility] D/C Physician: Reesa Chew, PA-C D/C Date:  04/04/22  Records Requested:  04/13/22 Records Received: 04/13/22 Records Reviewed: 04/13/22  Diagnoses on Discharge:   Above knee amputation of right lower extremity ([place])   Anemia in chronic kidney disease   ESRD on dialysis [facility])   Hypertensive heart and kidney disease with heart failure and chronic kidney disease stage V ([place])   Type 2 diabetes mellitus with ESRD (end-stage renal disease) ([place])   Heart failure with reduced ejection fraction ([facility])   Chronic atrial fibrillation with RVR ([place])   Pressure injury of skin  Date of interactive Contact within 48 hours of discharge: 04/06/22 Contact was through: phone  Date of 7 day or 14 day face-to-face visit:  04/13/22  within 14 days  Outpatient Encounter Medications as of 04/13/2022  Medication Sig Note   acetaminophen (TYLENOL) 325 MG tablet Take 1-2 tablets (325-650 mg total) by mouth every 4 (four) hours as needed for mild pain.    amiodarone (PACERONE) 200 MG tablet Take 1 tablet (200 mg total) by mouth 2 (two) times daily.    bumetanide (BUMEX) 1 MG tablet Take 1 tablet (1 mg total) by mouth 2 (two) times daily. 03/06/2022: Last fill 04-12-21 x 90 days supply.   calcitRIOL (ROCALTROL) 0.5 MCG capsule Take 3 capsules (1.5 mcg total) by mouth every Monday, Wednesday, and Friday at 6 PM.    cinacalcet (SENSIPAR) 30 MG tablet Take 1 tablet (30 mg total) by mouth daily with breakfast.    hydrocerin (EUCERIN) CREA Apply 1 Application topically 2 (two) times daily. To foot    HYDROcodone-acetaminophen (NORCO) 10-325 MG tablet Take 1  tablet by mouth every 8 (eight) hours as needed.    leptospermum manuka honey (MEDIHONEY) PSTE paste Apply 1 Application topically daily. To areas on buttocks and cover with dry dressing. Change daily.    melatonin 5 MG TABS Take 2 tablets (10 mg total) by mouth at bedtime as needed.    multivitamin (RENA-VIT) TABS tablet Take 1 tablet by mouth at bedtime.    senna (SENOKOT) 8.6 MG TABS tablet Take 2 tablets (17.2 mg total) by mouth at bedtime.    sevelamer carbonate (RENVELA) 800 MG tablet Take 2 tablets (1,600 mg total) by mouth 3 (three) times daily.    [DISCONTINUED] ELIQUIS 2.5 MG TABS tablet Take 1 tablet (2.5 mg total) by mouth 2 (two) times daily.    [DISCONTINUED] oxyCODONE-acetaminophen (PERCOCET) 10-325 MG tablet Take 1 tablet by mouth every 8 (eight) hours as needed for pain.    [DISCONTINUED] rosuvastatin (CRESTOR) 10 MG tablet Take 1 tablet (10 mg total) by mouth daily.    ELIQUIS 2.5 MG TABS tablet Take 1 tablet (2.5 mg total) by mouth 2 (two) times daily.    rosuvastatin (CRESTOR) 10 MG tablet Take 1 tablet (10 mg total) by mouth daily.    No facility-administered encounter medications on file as of 04/13/2022.  Per Hospitalist: "Brief HPI:   Adam Howell is a 62 y.o. male with history of HTN, T2DM with neuropathy, ESRD,  RLE wound with progressive decline and ulcer to the bone with copious drainage and myositis who was admitted to [facility] on 03/06/2022 and started on IV antibiotics due to concerns of osteo.  He he underwent I&D with attempts at limb salvage.  Hospital course was significant for transient AMS with episodes of flaccid LUE and decrease in responsiveness.  He was found to have 2 small infarcts acute or subacute in right cerebral white matter.  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  Gabapentin was DC'd and EEG done was negative for seizures.  He was  also felt to have CO2 narcosis due to underlying sleep apnea with lack of dialysis x 5 days, infection as well as polypharmacy.  Vascular service recommended AKA and patient initially declined this however was agreeable to undergo said procedure on 12/29 by Dr. Lucky Cowboy.  Postop PT and OT have been working with patient who continues to be limited by deficits in mobility as well as ADLs.  CIR was recommended due to functional decline.Marland Kitchen   Hospital Course: Adam Howell was admitted to rehab 03/19/2022 for inpatient therapies to consist of PT and OT at least three hours five days a week. Past admission physiatrist, therapy team and rehab RN have worked together to provide customized collaborative inpatient rehab.  His blood pressures were monitored on TID basis and have been reasonably controlled.  Nephrology has been following for management of hemodialysis MWF at the end of the day.  BiPAP was ordered however patient has refused this as he does not utilize this at home.  Heart rate has been controlled on amiodarone twice daily.  His blood sugars were monitored with ac/hs CBG checks and SSI was use prn for tighter BS control.  Blood sugars are controlled on diet alone.     Follow up CBC showed acute on chronic anemia that has been managed with ESA 100 mcg weekly as weill as IV iron supplement. Pain has been controlled with prn use of oxycodone and he was advised to wean down to home hydrocodone after discharge.  He was found to have sacral wounds that have a wall that is sloughing off of the top layer.  This has been treated with Medihoney covered with dry dressing and daily wound care as well as pressure-relief measures.   His AKA incision is healing well without any signs or symptoms of infection.  He has been making gains during his rehab stay however he started showing decreased participation in therapy few days prior to discharge.  He currently requires supervision at wheelchair level and has been educated on  home exercise plan.      Rehab course: During patient's stay in rehab weekly team conferences were held to monitor patient's progress, set goals and discuss barriers to discharge. At admission, patient required mod assist with ADL tasks and max assist with mobility. He  has had improvement in activity tolerance, balance, postural control as well as ability to compensate for deficits.  He is able to complete ADL tasks with supervision.  He requires contact-guard assist with cues for stand pivot transfers and min assist to ambulate 10 feet with standard walker. Family was unable to attend education and written resources were provided for them to refer to after discharge.   Wound care: Sacral MASD: Apply to fibro-necrotic areas of sacro-coccygeal wounds. Cover with foam dressing.  Change daily"  Diagnostic Tests  Reviewed: CLINICAL DATA:  Chronic right lower extremity ulcer. History of CKD on dialysis. Anemia of chronic disease and type 2 diabetes. CHF.   EXAM: MRI OF LOWER RIGHT EXTREMITY WITHOUT CONTRAST   TECHNIQUE: Multiplanar, multisequence MR imaging of the right tibia/fibula area of concern was performed. No intravenous contrast was administered.   COMPARISON:  Radiograph dated January 02, 2022   FINDINGS: Bones/Joint/Cartilage   Marrow signal is within normal limits. No evidence of osteomyelitis. No cortical erosion or periosteal reaction. No evidence of fracture or dislocation.   Ligaments   Interosseous ligament is intact.   Muscles and Tendons   Increased signal of the muscles of the anterior compartment with edema along the tendons suggesting myositis. No fluid collection or abscess.   Soft tissues   There is a deep skin wound about the medial aspect of the tibia without evidence of fluid collection or abscess. Generalized subcutaneous soft tissue edema about the lower extremity. No drainable fluid collection or abscess.   IMPRESSION: 1. Deep skin wound about the  medial aspect of the tibia without evidence of fluid collection or abscess. 2. Increased signal of the muscles of the anterior compartment with edema along the tendons suggesting myositis. No evidence of fluid collection or abscess. 3. Marrow signal is within normal limits without evidence of osteomyelitis.  CLINICAL DATA:  Code stroke. Neuro deficit, acute, stroke suspected.   EXAM: CT HEAD WITHOUT CONTRAST   TECHNIQUE: Contiguous axial images were obtained from the base of the skull through the vertex without intravenous contrast.   RADIATION DOSE REDUCTION: This exam was performed according to the departmental dose-optimization program which includes automated exposure control, adjustment of the mA and/or kV according to patient size and/or use of iterative reconstruction technique.   COMPARISON:  No pertinent prior exams available for comparison.   FINDINGS: Brain:   Mild generalized cerebral atrophy.   Small age-indeterminate cortically-based infarcts within the posterior left frontal lobe and left parietal lobe (series 3, images 26 and 25).   Small chronic cortically-based infarct within the left occipital lobe.   Age-indeterminate lacunar infarct within the left corona radiata (series 3, image 19).   10 mm age-indeterminate infarct within the right cerebellar hemisphere.   There is no acute intracranial hemorrhage.   No extra-axial fluid collection.   No evidence of an intracranial mass.   No midline shift.   Vascular: No hyperdense vessel. Atherosclerotic calcifications.   Skull: No fracture or aggressive osseous lesion.   Sinuses/Orbits: No mass or acute finding within the imaged orbits. Trace mucosal thickening scattered within the bilateral ethmoid air cells.   ASPECTS ([place] Stroke Program Early CT Score)   - Ganglionic level infarction (caudate, lentiform nuclei, internal capsule, insula, M1-M3 cortex): 7   - Supraganglionic infarction  (M4-M6 cortex): 1   Total score (0-10 with 10 being normal): 9 (when discounting an age-indeterminate cortically based infarct within the posterior left frontal lobe).   These results were communicated to Dr. Cheral Marker at 8:49 amon 12/26/2023by text page via the [facility] messaging system.   IMPRESSION: 1. Age-indeterminate infarcts within the cortical/subcortical left frontal and left parietal lobes, left corona radiata and right cerebellar hemisphere as described. ASPECTS is 8. 2. Small chronic cortically-based infarct within the left occipital lobe. 3. Mild cerebral atrophy.  CLINICAL DATA:  Neuro deficit with acute stroke suspected   EXAM: MRI HEAD WITHOUT CONTRAST   TECHNIQUE: Multiplanar, multiecho pulse sequences of the brain and surrounding structures were obtained without intravenous contrast.   COMPARISON:  Head CT from earlier today   FINDINGS: Brain: Punctate recent infarct in the right corona radiata and anterior frontal lobe, see markings on diffusion images. Small remote right cerebellar infarct. Small remote cortical infarcts scattered along the left frontal and parietal cortex. Rounded area of intense hemosiderin deposition in the right cerebellum measuring 16 mm, primarily low-density on head CT and likely old hemorrhagic lacunar infarct rather than cavernoma. Chronic lacunar infarct at the left external capsule. Generalized cerebral volume loss. No hemorrhage, hydrocephalus, or collection.   Vascular: Major flow voids are preserved   Skull and upper cervical spine: No focal marrow lesion. Low marrow signal in the covered cervical spine, homogeneous and usually physiologic.   Sinuses/Orbits: No acute finding   IMPRESSION: 1. Two small acute or subacute infarcts in the right cerebral white matter. 2. Prior lacunar and left frontoparietal cortex infarcts. 3. Remote hemorrhagic insult in the right cerebellum  Disposition: Home  Consults: PM&R, vascular  surgery, nephrology  Discharge Instructions:  Continue pressure-relief measures when in chair.     Increase protein intake. Take Vitamin C and ZInc daily to promote wound healing 3.  Resume home dose hydrocodone after oxycodone has been used up.    Disease/illness Education: Discussed today  Home Health/Community Services Discussions/Referrals:  N/A  Establishment or re-establishment of referral orders for community resources:  N/A  Discussion with other health care providers: N/A  Assessment and Support of treatment regimen adherence: Good  Appointments Coordinated with: Patient  Education for self-management, independent living, and ADLs: Discussed today  Since getting out of the hospital. Adam Howell has been feeling pretty good. Having some pain at night. No draining. He has been having the dressing changed on his leg and feels like he is ambulating well. He is waiting for his prosthesis. He has been putting cream on his tailbone and feels like the wound is almost healed. He denies any chills or sweats. He has had no fevers. He notes that dialysis has been going very well. No other concerns or complaints at this time.   Relevant past medical, surgical, family and social history reviewed and updated as indicated. Interim medical history since our last visit reviewed. Allergies and medications reviewed and updated.  Review of Systems  Constitutional: Negative.   HENT: Negative.    Respiratory: Negative.    Cardiovascular: Negative.   Gastrointestinal: Negative.   Musculoskeletal:  Positive for gait problem. Negative for arthralgias, back pain, joint swelling, myalgias, neck pain and neck stiffness.  Skin: Negative.   Psychiatric/Behavioral: Negative.      Per HPI unless specifically indicated above     Objective:    BP (!) 166/103   Pulse 80   Temp 98.1 F (36.7 C) (Oral)   Wt Readings from Last 3 Encounters:  04/04/22 231 lb 7.7 oz (105 kg)  03/19/22 235 lb 3.7 oz  (106.7 kg)  02/19/22 255 lb (115.7 kg)    Physical Exam Vitals and nursing note reviewed.  Constitutional:      General: He is not in acute distress.    Appearance: Normal appearance. He is normal weight. He is not ill-appearing, toxic-appearing or diaphoretic.  HENT:     Head: Normocephalic and atraumatic.     Right Ear: External ear normal.     Left Ear: External ear normal.     Nose: Nose normal.     Mouth/Throat:     Mouth: Mucous membranes are moist.     Pharynx: Oropharynx is clear.  Eyes:  General: No scleral icterus.       Right eye: No discharge.        Left eye: No discharge.     Extraocular Movements: Extraocular movements intact.     Conjunctiva/sclera: Conjunctivae normal.     Pupils: Pupils are equal, round, and reactive to light.  Cardiovascular:     Rate and Rhythm: Normal rate and regular rhythm.     Pulses: Normal pulses.     Heart sounds: Normal heart sounds. No murmur heard.    No friction rub. No gallop.  Pulmonary:     Effort: Pulmonary effort is normal. No respiratory distress.     Breath sounds: Normal breath sounds. No stridor. No wheezing, rhonchi or rales.  Chest:     Chest wall: No tenderness.  Musculoskeletal:        General: Normal range of motion.     Cervical back: Normal range of motion and neck supple.     Comments: R Above the knee amputation, wheelchair bound  Skin:    General: Skin is warm and dry.     Capillary Refill: Capillary refill takes less than 2 seconds.     Coloration: Skin is not jaundiced or pale.     Findings: No bruising, erythema, lesion or rash.  Neurological:     General: No focal deficit present.     Mental Status: [name] and oriented to person, place, and time. Mental status is at baseline.  Psychiatric:        Mood and Affect: Mood normal.        Behavior: Behavior normal.        Thought Content: Thought content normal.        Judgment: Judgment normal.     Results for orders placed or performed  during the hospital encounter of 03/19/22  Hepatitis B surface antigen  Result Value Ref Range   Hepatitis B Surface Ag NON REACTIVE NON REACTIVE  Hepatitis B surface antibody,quantitative  Result Value Ref Range   Hep B S AB Quant (Post) 387.4 Immunity>9.9 mIU/mL  Glucose, capillary  Result Value Ref Range   Glucose-Capillary 138 (H) 70 - 99 mg/dL  Ferritin  Result Value Ref Range   Ferritin 198 24 - 336 ng/mL  Iron and TIBC  Result Value Ref Range   Iron 34 (L) 45 - 182 ug/dL   TIBC 189 (L) 250 - 450 ug/dL   Saturation Ratios 18 17.9 - 39.5 %   UIBC 155 ug/dL  Glucose, capillary  Result Value Ref Range   Glucose-Capillary 110 (H) 70 - 99 mg/dL  Glucose, capillary  Result Value Ref Range   Glucose-Capillary 92 70 - 99 mg/dL  Glucose, capillary  Result Value Ref Range   Glucose-Capillary 82 70 - 99 mg/dL  CBC  Result Value Ref Range   WBC 9.6 4.0 - 10.5 K/uL   RBC 2.70 (L) 4.22 - 5.81 MIL/uL   Hemoglobin 7.8 (L) 13.0 - 17.0 g/dL   HCT 23.6 (L) 39.0 - 52.0 %   MCV 87.4 80.0 - 100.0 fL   MCH 28.9 26.0 - 34.0 pg   MCHC 33.1 30.0 - 36.0 g/dL   RDW 17.1 (H) 11.5 - 15.5 %   Platelets 268 150 - 400 K/uL   nRBC 0.0 0.0 - 0.2 %  Renal function panel  Result Value Ref Range   Sodium 132 (L) 135 - 145 mmol/L   Potassium 4.1 3.5 - 5.1 mmol/L   Chloride 94 (L)  98 - 111 mmol/L   CO2 27 22 - 32 mmol/L   Glucose, Bld 139 (H) 70 - 99 mg/dL   BUN 37 (H) 6 - 20 mg/dL   Creatinine, Ser 7.85 (H) 0.61 - 1.24 mg/dL   Calcium 8.2 (L) 8.9 - 10.3 mg/dL   Phosphorus 4.0 2.5 - 4.6 mg/dL   Albumin 2.3 (L) 3.5 - 5.0 g/dL   GFR, Estimated 7 (L) >60 mL/min   Anion gap 11 5 - 15  Glucose, capillary  Result Value Ref Range   Glucose-Capillary 89 70 - 99 mg/dL  Glucose, capillary  Result Value Ref Range   Glucose-Capillary 140 (H) 70 - 99 mg/dL  Glucose, capillary  Result Value Ref Range   Glucose-Capillary 104 (H) 70 - 99 mg/dL  Glucose, capillary  Result Value Ref Range    Glucose-Capillary 95 70 - 99 mg/dL  Glucose, capillary  Result Value Ref Range   Glucose-Capillary 115 (H) 70 - 99 mg/dL  Glucose, capillary  Result Value Ref Range   Glucose-Capillary 111 (H) 70 - 99 mg/dL  Glucose, capillary  Result Value Ref Range   Glucose-Capillary 96 70 - 99 mg/dL  Glucose, capillary  Result Value Ref Range   Glucose-Capillary 107 (H) 70 - 99 mg/dL  Glucose, capillary  Result Value Ref Range   Glucose-Capillary 98 70 - 99 mg/dL  Glucose, capillary  Result Value Ref Range   Glucose-Capillary 108 (H) 70 - 99 mg/dL  Glucose, capillary  Result Value Ref Range   Glucose-Capillary 86 70 - 99 mg/dL  CBC  Result Value Ref Range   WBC 9.0 4.0 - 10.5 K/uL   RBC 2.52 (L) 4.22 - 5.81 MIL/uL   Hemoglobin 7.3 (L) 13.0 - 17.0 g/dL   HCT 22.2 (L) 39.0 - 52.0 %   MCV 88.1 80.0 - 100.0 fL   MCH 29.0 26.0 - 34.0 pg   MCHC 32.9 30.0 - 36.0 g/dL   RDW 17.8 (H) 11.5 - 15.5 %   Platelets 295 150 - 400 K/uL   nRBC 0.0 0.0 - 0.2 %  Renal function panel  Result Value Ref Range   Sodium 133 (L) 135 - 145 mmol/L   Potassium 4.7 3.5 - 5.1 mmol/L   Chloride 93 (L) 98 - 111 mmol/L   CO2 27 22 - 32 mmol/L   Glucose, Bld 97 70 - 99 mg/dL   BUN 42 (H) 6 - 20 mg/dL   Creatinine, Ser 10.02 (H) 0.61 - 1.24 mg/dL   Calcium 8.1 (L) 8.9 - 10.3 mg/dL   Phosphorus 4.1 2.5 - 4.6 mg/dL   Albumin 2.3 (L) 3.5 - 5.0 g/dL   GFR, Estimated 5 (L) >60 mL/min   Anion gap 13 5 - 15  Glucose, capillary  Result Value Ref Range   Glucose-Capillary 94 70 - 99 mg/dL  Glucose, capillary  Result Value Ref Range   Glucose-Capillary 107 (H) 70 - 99 mg/dL  Glucose, capillary  Result Value Ref Range   Glucose-Capillary 109 (H) 70 - 99 mg/dL   Comment 1 Notify RN   Glucose, capillary  Result Value Ref Range   Glucose-Capillary 73 70 - 99 mg/dL  Glucose, capillary  Result Value Ref Range   Glucose-Capillary 93 70 - 99 mg/dL  Glucose, capillary  Result Value Ref Range   Glucose-Capillary 94 70  - 99 mg/dL  Glucose, capillary  Result Value Ref Range   Glucose-Capillary 122 (H) 70 - 99 mg/dL  Glucose, capillary  Result Value Ref  Range   Glucose-Capillary 85 70 - 99 mg/dL  Glucose, capillary  Result Value Ref Range   Glucose-Capillary 66 (L) 70 - 99 mg/dL  Glucose, capillary  Result Value Ref Range   Glucose-Capillary 89 70 - 99 mg/dL  CBC  Result Value Ref Range   WBC 6.5 4.0 - 10.5 K/uL   RBC 2.58 (L) 4.22 - 5.81 MIL/uL   Hemoglobin 7.4 (L) 13.0 - 17.0 g/dL   HCT 23.0 (L) 39.0 - 52.0 %   MCV 89.1 80.0 - 100.0 fL   MCH 28.7 26.0 - 34.0 pg   MCHC 32.2 30.0 - 36.0 g/dL   RDW 18.2 (H) 11.5 - 15.5 %   Platelets 300 150 - 400 K/uL   nRBC 0.0 0.0 - 0.2 %  Renal function panel  Result Value Ref Range   Sodium 134 (L) 135 - 145 mmol/L   Potassium 3.6 3.5 - 5.1 mmol/L   Chloride 95 (L) 98 - 111 mmol/L   CO2 29 22 - 32 mmol/L   Glucose, Bld 89 70 - 99 mg/dL   BUN 17 6 - 20 mg/dL   Creatinine, Ser 5.12 (H) 0.61 - 1.24 mg/dL   Calcium 7.9 (L) 8.9 - 10.3 mg/dL   Phosphorus 2.4 (L) 2.5 - 4.6 mg/dL   Albumin 2.3 (L) 3.5 - 5.0 g/dL   GFR, Estimated 12 (L) >60 mL/min   Anion gap 10 5 - 15  Glucose, capillary  Result Value Ref Range   Glucose-Capillary 112 (H) 70 - 99 mg/dL   Comment 1 Notify RN    Comment 2 Document in Chart   Glucose, capillary  Result Value Ref Range   Glucose-Capillary 86 70 - 99 mg/dL  Glucose, capillary  Result Value Ref Range   Glucose-Capillary 91 70 - 99 mg/dL  Glucose, capillary  Result Value Ref Range   Glucose-Capillary 97 70 - 99 mg/dL  Glucose, capillary  Result Value Ref Range   Glucose-Capillary 123 (H) 70 - 99 mg/dL  Glucose, capillary  Result Value Ref Range   Glucose-Capillary 76 70 - 99 mg/dL  Glucose, capillary  Result Value Ref Range   Glucose-Capillary 105 (H) 70 - 99 mg/dL  Glucose, capillary  Result Value Ref Range   Glucose-Capillary 84 70 - 99 mg/dL  Glucose, capillary  Result Value Ref Range   Glucose-Capillary 109  (H) 70 - 99 mg/dL  Glucose, capillary  Result Value Ref Range   Glucose-Capillary 81 70 - 99 mg/dL  Glucose, capillary  Result Value Ref Range   Glucose-Capillary 87 70 - 99 mg/dL  Glucose, capillary  Result Value Ref Range   Glucose-Capillary 113 (H) 70 - 99 mg/dL   Comment 1 Notify RN   Glucose, capillary  Result Value Ref Range   Glucose-Capillary 94 70 - 99 mg/dL  Glucose, capillary  Result Value Ref Range   Glucose-Capillary 84 70 - 99 mg/dL   Comment 1 Notify RN   Renal function panel  Result Value Ref Range   Sodium 132 (L) 135 - 145 mmol/L   Potassium 4.0 3.5 - 5.1 mmol/L   Chloride 93 (L) 98 - 111 mmol/L   CO2 24 22 - 32 mmol/L   Glucose, Bld 88 70 - 99 mg/dL   BUN 42 (H) 6 - 20 mg/dL   Creatinine, Ser 10.67 (H) 0.61 - 1.24 mg/dL   Calcium 7.7 (L) 8.9 - 10.3 mg/dL   Phosphorus 3.9 2.5 - 4.6 mg/dL   Albumin 2.6 (L) 3.5 -  5.0 g/dL   GFR, Estimated 5 (L) >60 mL/min   Anion gap 15 5 - 15  CBC  Result Value Ref Range   WBC 4.4 4.0 - 10.5 K/uL   RBC 2.66 (L) 4.22 - 5.81 MIL/uL   Hemoglobin 7.6 (L) 13.0 - 17.0 g/dL   HCT 24.3 (L) 39.0 - 52.0 %   MCV 91.4 80.0 - 100.0 fL   MCH 28.6 26.0 - 34.0 pg   MCHC 31.3 30.0 - 36.0 g/dL   RDW 18.6 (H) 11.5 - 15.5 %   Platelets 184 150 - 400 K/uL   nRBC 0.0 0.0 - 0.2 %  Glucose, capillary  Result Value Ref Range   Glucose-Capillary 78 70 - 99 mg/dL  Glucose, capillary  Result Value Ref Range   Glucose-Capillary 71 70 - 99 mg/dL  CBC  Result Value Ref Range   WBC 4.6 4.0 - 10.5 K/uL   RBC 2.84 (L) 4.22 - 5.81 MIL/uL   Hemoglobin 8.2 (L) 13.0 - 17.0 g/dL   HCT 25.0 (L) 39.0 - 52.0 %   MCV 88.0 80.0 - 100.0 fL   MCH 28.9 26.0 - 34.0 pg   MCHC 32.8 30.0 - 36.0 g/dL   RDW 18.5 (H) 11.5 - 15.5 %   Platelets 156 150 - 400 K/uL   nRBC 0.0 0.0 - 0.2 %  Renal function panel  Result Value Ref Range   Sodium 130 (L) 135 - 145 mmol/L   Potassium 4.4 3.5 - 5.1 mmol/L   Chloride 92 (L) 98 - 111 mmol/L   CO2 25 22 - 32 mmol/L    Glucose, Bld 98 70 - 99 mg/dL   BUN 38 (H) 6 - 20 mg/dL   Creatinine, Ser 9.42 (H) 0.61 - 1.24 mg/dL   Calcium 7.5 (L) 8.9 - 10.3 mg/dL   Phosphorus 3.7 2.5 - 4.6 mg/dL   Albumin 2.6 (L) 3.5 - 5.0 g/dL   GFR, Estimated 6 (L) >60 mL/min   Anion gap 13 5 - 15  Glucose, capillary  Result Value Ref Range   Glucose-Capillary 85 70 - 99 mg/dL  Glucose, capillary  Result Value Ref Range   Glucose-Capillary 85 70 - 99 mg/dL  Renal function panel  Result Value Ref Range   Sodium 132 (L) 135 - 145 mmol/L   Potassium 3.6 3.5 - 5.1 mmol/L   Chloride 94 (L) 98 - 111 mmol/L   CO2 28 22 - 32 mmol/L   Glucose, Bld 133 (H) 70 - 99 mg/dL   BUN 17 6 - 20 mg/dL   Creatinine, Ser 5.65 (H) 0.61 - 1.24 mg/dL   Calcium 8.0 (L) 8.9 - 10.3 mg/dL   Phosphorus 2.2 (L) 2.5 - 4.6 mg/dL   Albumin 2.6 (L) 3.5 - 5.0 g/dL   GFR, Estimated 11 (L) >60 mL/min   Anion gap 10 5 - 15  CBC  Result Value Ref Range   WBC 4.5 4.0 - 10.5 K/uL   RBC 3.09 (L) 4.22 - 5.81 MIL/uL   Hemoglobin 9.0 (L) 13.0 - 17.0 g/dL   HCT 27.5 (L) 39.0 - 52.0 %   MCV 89.0 80.0 - 100.0 fL   MCH 29.1 26.0 - 34.0 pg   MCHC 32.7 30.0 - 36.0 g/dL   RDW 18.3 (H) 11.5 - 15.5 %   Platelets 182 150 - 400 K/uL   nRBC 0.0 0.0 - 0.2 %      Assessment & Plan:   Problem List Items Addressed This Visit  Cardiovascular and Mediastinum   Hypertensive heart and kidney disease with heart failure and chronic kidney disease stage V (HCC)    BP running a little high. Had been running low. Will treat his pain and get him back into see his PCP in 1 month. Call with any concerns.       Relevant Medications   ELIQUIS 2.5 MG TABS tablet   rosuvastatin (CRESTOR) 10 MG tablet   Other Relevant Orders   Ambulatory referral to Cardiology   Heart failure with reduced ejection fraction ([place])    Euvolemic today. Will get him back into see cardiology. Continue to monitor. Call with any concerns.       Relevant Medications   ELIQUIS 2.5 MG TABS  tablet   rosuvastatin (CRESTOR) 10 MG tablet   Other Relevant Orders   Ambulatory referral to Cardiology   Chronic atrial fibrillation with RVR ([place])    To continue eliquis and amiodarone. Has not seen cardiology in a long time- will get him back in to see them. Referral generated today.      Relevant Medications   ELIQUIS 2.5 MG TABS tablet   rosuvastatin (CRESTOR) 10 MG tablet   Other Relevant Orders   Ambulatory referral to Cardiology     Endocrine   Secondary hyperparathyroidism of renal origin [facility])    Continue to follow with nephrology. Continue to monitor. Call with any concerns.       Type 2 diabetes mellitus with ESRD (end-stage renal disease) (HCC)    Last A1c was under good control at 5.6- insulin has been discontinued by the hospital. A1c today 4.7- will keep him off his insulin. Continue to monitor.       Relevant Medications   rosuvastatin (CRESTOR) 10 MG tablet   Other Relevant Orders   Bayer DCA Hb A1c Waived     Genitourinary   Anemia in chronic kidney disease   ESRD on dialysis [facility])    Continue to follow with nephrology. Continue dialysis. Cal with any concerns.         Hematopoietic and Hemostatic   Other thrombophilia ([place])    Continue eliquis. Call with any concerns.         Other   Chronic pain syndrome    Will get him refill on his medicine for 1 month. Follow up with PCP. Referral to pain management placed today.      Relevant Medications   HYDROcodone-acetaminophen (NORCO) 10-325 MG tablet   Other Relevant Orders   Ambulatory referral to Pain Clinic   S/P AKA (above knee amputation), right ([place]) - Primary    Doing well. Ambulating well. No concerns.       Pressure injury of skin    Continue dressing changes. Call with any concerns.       Necrotizing fasciitis of lower leg (HCC)    Resolved with amputation.         Follow up plan: Return in about 4 weeks (around 05/11/2022) for with PCP.  >30 minutes spent with patient  today

## 2022-04-13 NOTE — Patient Instructions (Signed)
  STOP taking these medications     carvedilol 3.125 MG tablet Commonly known as: COREG    gabapentin 300 MG capsule Commonly known as: NEURONTIN    HYDROcodone-acetaminophen 10-325 MG tablet Commonly known as: NORCO    insulin aspart 100 UNIT/ML FlexPen Commonly known as: NOVOLOG    isosorbide mononitrate 30 MG 24 hr tablet Commonly known as: IMDUR    losartan 25 MG tablet Commonly known as: COZAAR    NIFEdipine 30 MG 24 hr tablet Commonly known as: PROCARDIA-XL/NIFEDICAL-XL    sertraline 50 MG tablet Commonly known as: ZOLOFT    tiZANidine 2 MG tablet Commonly known as: ZANAFLEX

## 2022-04-13 NOTE — Assessment & Plan Note (Addendum)
Last A1c was under good control at 5.6- insulin has been discontinued by the hospital. A1c today 4.7- will keep him off his insulin. Continue to monitor.

## 2022-04-13 NOTE — Assessment & Plan Note (Signed)
Continue eliquis. Call with any concerns.

## 2022-04-13 NOTE — Assessment & Plan Note (Signed)
Doing well. Ambulating well. No concerns.

## 2022-04-13 NOTE — Assessment & Plan Note (Signed)
Continue dressing changes. Call with any concerns.

## 2022-04-13 NOTE — Assessment & Plan Note (Signed)
Resolved with amputation.

## 2022-04-13 NOTE — Assessment & Plan Note (Signed)
BP running a little high. Had been running low. Will treat his pain and get him back into see his PCP in 1 month. Call with any concerns.

## 2022-04-13 NOTE — Assessment & Plan Note (Signed)
Euvolemic today. Will get him back into see cardiology. Continue to monitor. Call with any concerns.

## 2022-04-15 ENCOUNTER — Encounter: Payer: Medicare HMO | Attending: Physical Medicine & Rehabilitation | Admitting: Physical Medicine & Rehabilitation

## 2022-04-15 DIAGNOSIS — E441 Mild protein-calorie malnutrition: Secondary | ICD-10-CM | POA: Insufficient documentation

## 2022-04-15 DIAGNOSIS — N2581 Secondary hyperparathyroidism of renal origin: Secondary | ICD-10-CM | POA: Diagnosis not present

## 2022-04-15 DIAGNOSIS — I120 Hypertensive chronic kidney disease with stage 5 chronic kidney disease or end stage renal disease: Secondary | ICD-10-CM | POA: Diagnosis not present

## 2022-04-15 DIAGNOSIS — Z992 Dependence on renal dialysis: Secondary | ICD-10-CM | POA: Diagnosis not present

## 2022-04-15 DIAGNOSIS — N186 End stage renal disease: Secondary | ICD-10-CM | POA: Diagnosis not present

## 2022-04-17 ENCOUNTER — Emergency Department
Admission: EM | Admit: 2022-04-17 | Discharge: 2022-04-17 | Disposition: A | Discharge status: Home / Self Care | Payer: Medicare HMO | Attending: Emergency Medicine | Admitting: Emergency Medicine

## 2022-04-17 ENCOUNTER — Other Ambulatory Visit: Payer: Self-pay

## 2022-04-17 DIAGNOSIS — R319 Hematuria, unspecified: Secondary | ICD-10-CM | POA: Diagnosis not present

## 2022-04-17 DIAGNOSIS — R6889 Other general symptoms and signs: Secondary | ICD-10-CM | POA: Diagnosis not present

## 2022-04-17 DIAGNOSIS — I509 Heart failure, unspecified: Secondary | ICD-10-CM | POA: Insufficient documentation

## 2022-04-17 DIAGNOSIS — Z992 Dependence on renal dialysis: Secondary | ICD-10-CM | POA: Diagnosis not present

## 2022-04-17 DIAGNOSIS — Z7901 Long term (current) use of anticoagulants: Secondary | ICD-10-CM | POA: Insufficient documentation

## 2022-04-17 DIAGNOSIS — N186 End stage renal disease: Secondary | ICD-10-CM | POA: Diagnosis not present

## 2022-04-17 DIAGNOSIS — E1122 Type 2 diabetes mellitus with diabetic chronic kidney disease: Secondary | ICD-10-CM | POA: Diagnosis not present

## 2022-04-17 DIAGNOSIS — I132 Hypertensive heart and chronic kidney disease with heart failure and with stage 5 chronic kidney disease, or end stage renal disease: Secondary | ICD-10-CM | POA: Insufficient documentation

## 2022-04-17 LAB — CBC WITH DIFFERENTIAL/PLATELET
Abs Immature Granulocytes: 0.02 10*3/uL (ref 0.00–0.07)
Basophils Absolute: 0.1 10*3/uL (ref 0.0–0.1)
Basophils Relative: 2 %
Eosinophils Absolute: 0.5 10*3/uL (ref 0.0–0.5)
Eosinophils Relative: 9 %
HCT: 32.1 % — ABNORMAL LOW (ref 39.0–52.0)
Hemoglobin: 9.9 g/dL — ABNORMAL LOW (ref 13.0–17.0)
Immature Granulocytes: 0 %
Lymphocytes Relative: 18 %
Lymphs Abs: 1 10*3/uL (ref 0.7–4.0)
MCH: 29.1 pg (ref 26.0–34.0)
MCHC: 30.8 g/dL (ref 30.0–36.0)
MCV: 94.4 fL (ref 80.0–100.0)
Monocytes Absolute: 0.5 10*3/uL (ref 0.1–1.0)
Monocytes Relative: 9 %
Neutro Abs: 3.4 10*3/uL (ref 1.7–7.7)
Neutrophils Relative %: 62 %
Platelets: 192 10*3/uL (ref 150–400)
RBC: 3.4 MIL/uL — ABNORMAL LOW (ref 4.22–5.81)
RDW: 18 % — ABNORMAL HIGH (ref 11.5–15.5)
WBC: 5.6 10*3/uL (ref 4.0–10.5)
nRBC: 0 % (ref 0.0–0.2)

## 2022-04-17 LAB — BASIC METABOLIC PANEL
Anion gap: 12 (ref 5–15)
BUN: 33 mg/dL — ABNORMAL HIGH (ref 6–20)
CO2: 28 mmol/L (ref 22–32)
Calcium: 7.5 mg/dL — ABNORMAL LOW (ref 8.9–10.3)
Chloride: 98 mmol/L (ref 98–111)
Creatinine, Ser: 7.18 mg/dL — ABNORMAL HIGH (ref 0.61–1.24)
GFR, Estimated: 8 mL/min — ABNORMAL LOW (ref >60)
Glucose, Bld: 92 mg/dL (ref 70–99)
Potassium: 3.5 mmol/L (ref 3.5–5.1)
Sodium: 138 mmol/L (ref 135–145)

## 2022-04-17 MED ORDER — CEFTRIAXONE SODIUM 1 G IJ SOLR
1.0000 g | Freq: Once | INTRAMUSCULAR | Status: AC
  Administered 2022-04-17: 1 g via INTRAMUSCULAR
  Filled 2022-04-17 (×2): qty 10

## 2022-04-17 MED ORDER — CEPHALEXIN 500 MG PO CAPS: ORAL_CAPSULE | ORAL | 0 refills | Status: DC

## 2022-04-17 MED ORDER — SODIUM CHLORIDE 0.9 % IV SOLN: 1.0000 g | Freq: Once | INTRAVENOUS | Status: DC

## 2022-04-17 NOTE — ED Notes (Signed)
Pt gave urine specimen. The amount was not enough according to lab. Urine was pink/blood tinged in color and cloudy. Provider aware. Pt given 2nd cup of water to try to produce more urine.

## 2022-04-17 NOTE — ED Notes (Signed)
This tech just took pt off bedpan, cleaned up, and repositioned in bed. No other needs expressed by the pt at this time.

## 2022-04-17 NOTE — ED Triage Notes (Signed)
Pt states he noticed a "little bit" of blood in his urine this am. Pt denies pain. Pt denies taking a blood thinner. Pt is A&Ox4.

## 2022-04-17 NOTE — ED Notes (Signed)
Pt refuses catheter for urine sample. Pt states he would like to start antibiotics instead. Md notified.

## 2022-04-17 NOTE — Discharge Instructions (Addendum)
Take the antibiotic Keflex 500 mg after dialysis.  Take for 2 weeks, for total of 6 doses.  This antibiotic would help clear the urine infection, which we are starting in case you have a urine infection with your painful urination and blood in the urine.  We were unable to obtain a urine sample to test for infection so are treating as if you have a urine infection due to your symptoms.  If you continue to see blood in your urine after this treatment, please call Dr. Hollice Espy of urology to schedule a follow-up for continued blood in the urine to check for other causes.  More fiber like fruits and vegetables to help soften your stools to help with constipation.  You had some blood that you noticed 2 days ago your bowel movement but there is none today, if you start to notice more bleeding from the rectum call your doctor right away  Thank you for choosing [place] for your health care today!  Please see your primary doctor this week for a follow up appointment.   Sometimes, in the early stages of certain disease courses it is difficult to detect in the emergency department evaluation -- so, it is important that you continue to monitor your symptoms and call your doctor right away or return to the emergency department if you develop any new or worsening symptoms.  Please go to the following website to schedule new (and existing) patient appointments:   [email or web address]  If you do not have a primary doctor try calling the following clinics to establish care:  If you have insurance:  [facility] [phone] [place] [address]   [facility]  [phone] [place] [place] [address]   If you do not have insurance:  [facility]  [phone] [address], [place] [address]   The following is another list of primary care offices in the area who are accepting new patients at this  time.  Please reach out to one of them directly and let them know you would like to schedule an appointment to follow up on an Emergency Department visit, and/or to establish a new primary care provider (PCP).  There are likely other primary care clinics in the are who are accepting new patients, but this is an excellent place to start:  [place] physician: Dr Lavon Paganini [address] [place], [place] [address] [phone]  [facility] Lead Physician: Dr Steele Sizer [address], [place], [place] [address] [phone]  [place] Physician: Dr Park Liter [address] [place], [place] [address] [phone]  [facility] Lead Physician: Dr Dewaine Oats [place], [place], [place] [address] [phone]  [place] [facility] [place] Physician: Dr Halina Maidens [address] [place], [place] [address] [phone]   It was my pleasure to care for you today.   Hoover Brunette Jacelyn Grip, MD

## 2022-04-17 NOTE — ED Provider Notes (Signed)
[facility] Provider Note    Event Date/Time   First MD Initiated Contact with Patient 04/17/22 [phone]     (approximate)   History   Hematuria   HPI  Adam Howell is a 62 y.o. male   Past medical history of multiple comorbidities including atrial fibrillation on Eliquis, end-stage renal disease on dialysis Monday Wednesday Friday, diabetes, leg amputation, CHF, hypertension hyperlipidemia presents to the emergency department with hematuria.  Makes a little bit of urine each morning and found some blood mixed in with his urine this morning.  No systemic symptoms like fevers chills nausea or vomiting.  No pain in the abdomen.  Otherwise has been in his regular state of health and his last dialysis session was Wednesday, was due this morning for his next dialysis session but delayed it due to his visit in the emergency department.  He has been compliant with all medications.  Will urinate morning, minimal output.  Not sexually active, no penile drainage.  No testicular pain.  External Medical Documents Reviewed: Discharge summary from April 03, 2022 hospitalization at that time for osteomyelitis, stroke, above-knee amputation discharged to rehab and subsequently discharged home.      Physical Exam   Triage Vital Signs: ED Triage Vitals  Enc Vitals Group     BP 04/17/22 0716 (!) 141/63     Pulse Rate 04/17/22 0714 69     Resp 04/17/22 0714 16     Temp 04/17/22 0714 98.4 F (36.9 C)     Temp Source 04/17/22 0714 Oral     SpO2 04/17/22 0714 100 %     Weight --      Height --      Head Circumference --      Peak Flow --      Pain Score 04/17/22 0713 0     Pain Loc --      Pain Edu? --      Excl. in [place]? --     Most recent vital signs: Vitals:   04/17/22 0714 04/17/22 0716  BP:  (!) 141/63  Pulse: 69   Resp: 16   Temp: 98.4 F (36.9 C)   SpO2: 100%     General: Awake, no distress.  CV:  Good peripheral perfusion.  Resp:  Normal  effort.  Abd:  No distention.  Other:  Awake alert oriented normal vital signs no fever, penis appears normal with no active drainage, testicles appear normal no tenderness, abdomen soft and nontender, overall nontoxic appearance.   ED Results / Procedures / Treatments   Labs (all labs ordered are listed, but only abnormal results are displayed) Labs Reviewed  BASIC METABOLIC PANEL - Abnormal; Notable for the following components:      Result Value   BUN 33 (*)    Creatinine, Ser 7.18 (*)    Calcium 7.5 (*)    GFR, Estimated 8 (*)    All other components within normal limits  CBC WITH DIFFERENTIAL/PLATELET - Abnormal; Notable for the following components:   RBC 3.40 (*)    Hemoglobin 9.9 (*)    HCT 32.1 (*)    RDW 18.0 (*)    All other components within normal limits     I ordered and reviewed the above labs they are notable for creatinine of 7.18 and a potassium 3.5    PROCEDURES:  Critical Care performed: No  Procedures   MEDICATIONS ORDERED IN ED: Medications  cefTRIAXone (ROCEPHIN) injection 1 g (has  no administration in time range)     IMPRESSION / MDM / ASSESSMENT AND PLAN / ED COURSE  I reviewed the triage vital signs and the nursing notes.                                Patient's presentation is most consistent with acute presentation with potential threat to life or bodily function.  Differential diagnosis includes, but is not limited to, urinary tract infection, hematuria, malignancy, considered but less likely sepsis, significant blood loss, sti   The patient is on the cardiac monitor to evaluate for evidence of arrhythmia and/or significant heart rate changes.  MDM: This is a patient on dialysis with minimal hematuria but no other symptoms no pain who appears comfortable with normal vital signs due for dialysis later today but fortunately his labs do not appear that he needs emergent dialysis.  Will check basic labs for kidney function, and attempt  to get a urinalysis to check for urinary tract infection.  Patient unable to provide adequate urine sample and declines catheterization to obtain sample.  I will empirically treat him due to discomfort with urination and hematuria with a course of antibiotics, and if hematuria progresses I gave him contact information for urologist for follow-up for workup of further causes of hematuria.  He is stable for discharge.  Agrees with plan.  I called his legal guardian daughter about the plan and she relates an episode of rectal bleeding.  I asked the patient about this episode he states he was constipated and pushing on the commode 2 days ago and there was some blood mixed in with his bowel movements.  Had a normal bowel movement this morning without any bleeding.  He had another bowel movement in the emergency department and there was no signs of blood or melena.  I advised him to follow-up with his primary doctor if he sees more episodes of rectal bleeding for further workup and increase fiber in his diet.  No signs of significant blood loss today with normal H&H as well as vital signs.   I considered hospitalization for admission or observation but given asymptomatic and well-appearing today with normal workup as above, outpatient monitoring and follow-up is most appropriate at this time and patient and daughter are both in agreement.        FINAL CLINICAL IMPRESSION(S) / ED DIAGNOSES   Final diagnoses:  Hematuria, unspecified type     Rx / DC Orders   ED Discharge Orders          Ordered    cephALEXin (KEFLEX) 500 MG capsule        04/17/22 0955             Note:  This document was prepared using Dragon voice recognition software and may include unintentional dictation errors.    Lucillie Garfinkel, MD 04/17/22 1046

## 2022-04-20 DIAGNOSIS — N186 End stage renal disease: Secondary | ICD-10-CM | POA: Diagnosis not present

## 2022-04-20 DIAGNOSIS — Z992 Dependence on renal dialysis: Secondary | ICD-10-CM | POA: Diagnosis not present

## 2022-04-20 DIAGNOSIS — N2581 Secondary hyperparathyroidism of renal origin: Secondary | ICD-10-CM | POA: Diagnosis not present

## 2022-04-22 DIAGNOSIS — N2581 Secondary hyperparathyroidism of renal origin: Secondary | ICD-10-CM | POA: Diagnosis not present

## 2022-04-22 DIAGNOSIS — N186 End stage renal disease: Secondary | ICD-10-CM | POA: Diagnosis not present

## 2022-04-22 DIAGNOSIS — Z992 Dependence on renal dialysis: Secondary | ICD-10-CM | POA: Diagnosis not present

## 2022-04-24 DIAGNOSIS — N2581 Secondary hyperparathyroidism of renal origin: Secondary | ICD-10-CM | POA: Diagnosis not present

## 2022-04-24 DIAGNOSIS — N186 End stage renal disease: Secondary | ICD-10-CM | POA: Diagnosis not present

## 2022-04-24 DIAGNOSIS — Z992 Dependence on renal dialysis: Secondary | ICD-10-CM | POA: Diagnosis not present

## 2022-04-27 ENCOUNTER — Telehealth: Payer: Self-pay | Admitting: Nurse Practitioner

## 2022-04-27 DIAGNOSIS — N2581 Secondary hyperparathyroidism of renal origin: Secondary | ICD-10-CM | POA: Diagnosis not present

## 2022-04-27 DIAGNOSIS — Z992 Dependence on renal dialysis: Secondary | ICD-10-CM | POA: Diagnosis not present

## 2022-04-27 DIAGNOSIS — N186 End stage renal disease: Secondary | ICD-10-CM | POA: Diagnosis not present

## 2022-04-27 NOTE — Telephone Encounter (Signed)
Copied from [place]. Topic: General - Other >> Apr 27, 2022  2:59 PM Chapman Fitch wrote: Reason for CRM: Pt called transportation and pt needs a referral for them to take the pt to [facility] med pain clinic since pt has never been there before / please advise

## 2022-04-27 NOTE — Telephone Encounter (Signed)
Pt is calling back to report that he does need a referral to the transportation team. [phone]

## 2022-04-28 NOTE — Telephone Encounter (Signed)
Noted  

## 2022-04-29 DIAGNOSIS — N2581 Secondary hyperparathyroidism of renal origin: Secondary | ICD-10-CM | POA: Diagnosis not present

## 2022-04-29 DIAGNOSIS — N186 End stage renal disease: Secondary | ICD-10-CM | POA: Diagnosis not present

## 2022-04-29 DIAGNOSIS — Z992 Dependence on renal dialysis: Secondary | ICD-10-CM | POA: Diagnosis not present

## 2022-05-01 DIAGNOSIS — N186 End stage renal disease: Secondary | ICD-10-CM | POA: Diagnosis not present

## 2022-05-01 DIAGNOSIS — Z992 Dependence on renal dialysis: Secondary | ICD-10-CM | POA: Diagnosis not present

## 2022-05-01 DIAGNOSIS — N2581 Secondary hyperparathyroidism of renal origin: Secondary | ICD-10-CM | POA: Diagnosis not present

## 2022-05-04 DIAGNOSIS — Z992 Dependence on renal dialysis: Secondary | ICD-10-CM | POA: Diagnosis not present

## 2022-05-04 DIAGNOSIS — N2581 Secondary hyperparathyroidism of renal origin: Secondary | ICD-10-CM | POA: Diagnosis not present

## 2022-05-04 DIAGNOSIS — N186 End stage renal disease: Secondary | ICD-10-CM | POA: Diagnosis not present

## 2022-05-06 ENCOUNTER — Telehealth: Payer: Self-pay | Admitting: Nurse Practitioner

## 2022-05-06 DIAGNOSIS — N186 End stage renal disease: Secondary | ICD-10-CM | POA: Diagnosis not present

## 2022-05-06 DIAGNOSIS — N2581 Secondary hyperparathyroidism of renal origin: Secondary | ICD-10-CM | POA: Diagnosis not present

## 2022-05-06 DIAGNOSIS — Z992 Dependence on renal dialysis: Secondary | ICD-10-CM | POA: Diagnosis not present

## 2022-05-08 DIAGNOSIS — N186 End stage renal disease: Secondary | ICD-10-CM | POA: Diagnosis not present

## 2022-05-08 DIAGNOSIS — N2581 Secondary hyperparathyroidism of renal origin: Secondary | ICD-10-CM | POA: Diagnosis not present

## 2022-05-08 DIAGNOSIS — Z992 Dependence on renal dialysis: Secondary | ICD-10-CM | POA: Diagnosis not present

## 2022-05-09 NOTE — Patient Instructions (Signed)
Diabetes Mellitus Basics  Diabetes mellitus, or diabetes, is a long-term (chronic) disease. It occurs when the body does not properly use sugar (glucose) that is released from food after you eat. Diabetes mellitus may be caused by one or both of these problems: Your pancreas does not make enough of a hormone called insulin. Your body does not react in a normal way to the insulin that it makes. Insulin lets glucose enter cells in your body. This gives you energy. If you have diabetes, glucose cannot get into cells. This causes high blood glucose (hyperglycemia). How to treat and manage diabetes You may need to take insulin or other diabetes medicines daily to keep your glucose in balance. If you are prescribed insulin, you will learn how to give yourself insulin by injection. You may need to adjust the amount of insulin you take based on the foods that you eat. You will need to check your blood glucose levels using a glucose monitor as told by your health care provider. The readings can help determine if you have low or high blood glucose. Generally, you should have these blood glucose levels: Before meals (preprandial): 80-130 mg/dL (4.4-7.2 mmol/L). After meals (postprandial): below 180 mg/dL (10 mmol/L). Hemoglobin A1c (HbA1c) level: less than 7%. Your health care provider will set treatment goals for you. Keep all follow-up visits. This is important. Follow these instructions at home: Diabetes medicines Take your diabetes medicines every day as told by your health care provider. List your diabetes medicines here: Name of medicine: ______________________________ Amount (dose): _______________ Time (a.m./p.m.): _______________ Notes: ___________________________________ Name of medicine: ______________________________ Amount (dose): _______________ Time (a.m./p.m.): _______________ Notes: ___________________________________ Name of medicine: ______________________________ Amount (dose):  _______________ Time (a.m./p.m.): _______________ Notes: ___________________________________ Insulin If you use insulin, list the types of insulin you use here: Insulin type: ______________________________ Amount (dose): _______________ Time (a.m./p.m.): _______________Notes: ___________________________________ Insulin type: ______________________________ Amount (dose): _______________ Time (a.m./p.m.): _______________ Notes: ___________________________________ Insulin type: ______________________________ Amount (dose): _______________ Time (a.m./p.m.): _______________ Notes: ___________________________________ Insulin type: ______________________________ Amount (dose): _______________ Time (a.m./p.m.): _______________ Notes: ___________________________________ Insulin type: ______________________________ Amount (dose): _______________ Time (a.m./p.m.): _______________ Notes: ___________________________________ Managing blood glucose  Check your blood glucose levels using a glucose monitor as told by your health care provider. Write down the times that you check your glucose levels here: Time: _______________ Notes: ___________________________________ Time: _______________ Notes: ___________________________________ Time: _______________ Notes: ___________________________________ Time: _______________ Notes: ___________________________________ Time: _______________ Notes: ___________________________________ Time: _______________ Notes: ___________________________________  Low blood glucose Low blood glucose (hypoglycemia) is when glucose is at or below 70 mg/dL (3.9 mmol/L). Symptoms may include: Feeling: Hungry. Sweaty and clammy. Irritable or easily upset. Dizzy. Sleepy. Having: A fast heartbeat. A headache. A change in your vision. Numbness around the mouth, lips, or tongue. Having trouble with: Moving (coordination). Sleeping. Treating low blood glucose To treat low blood  glucose, eat or drink something containing sugar right away. If you can think clearly and swallow safely, follow the 15:15 rule: Take 15 grams of a fast-acting carb (carbohydrate), as told by your health care provider. Some fast-acting carbs are: Glucose tablets: take 3-4 tablets. Hard candy: eat 3-5 pieces. Fruit juice: drink 4 oz (120 mL). Regular (not diet) soda: drink 4-6 oz (120-180 mL). Honey or sugar: eat 1 Tbsp (15 mL). Check your blood glucose levels 15 minutes after you take the carb. If your glucose is still at or below 70 mg/dL (3.9 mmol/L), take 15 grams of a carb again. If your glucose does not go above 70 mg/dL (3.9 mmol/L) after   3 tries, get help right away. After your glucose goes back to normal, eat a meal or a snack within 1 hour. Treating very low blood glucose If your glucose is at or below 54 mg/dL (3 mmol/L), you have very low blood glucose (severe hypoglycemia). This is an emergency. Do not wait to see if the symptoms will go away. Get medical help right away. Call your local emergency services (911 in the [place] Do not drive yourself to the hospital. Questions to ask your health care provider Should I talk with a diabetes educator? What equipment will I need to care for myself at home? What diabetes medicines do I need? When should I take them? How often do I need to check my blood glucose levels? What number can I call if I have questions? When is my follow-up visit? Where can I find a support group for people with diabetes? Where to find more information American Diabetes Association: www.diabetes.org Association of Diabetes Care and Education Specialists: www.diabeteseducator.org Contact a health care provider if: Your blood glucose is at or above 240 mg/dL (13.3 mmol/L) for 2 days in a row. You have been sick or have had a fever for 2 days or more, and you are not getting better. You have any of these problems for more than 6 hours: You cannot eat or  drink. You feel nauseous. You vomit. You have diarrhea. Get help right away if: Your blood glucose is lower than 54 mg/dL (3 mmol/L). You get confused. You have trouble thinking clearly. You have trouble breathing. These symptoms may represent a serious problem that is an emergency. Do not wait to see if the symptoms will go away. Get medical help right away. Call your local emergency services (911 in the [place] Do not drive yourself to the hospital. Summary Diabetes mellitus is a chronic disease that occurs when the body does not properly use sugar (glucose) that is released from food after you eat. Take insulin and diabetes medicines as told. Check your blood glucose every day, as often as told. Keep all follow-up visits. This is important. This information is not intended to replace advice given to you by your health care provider. Make sure you discuss any questions you have with your health care provider. Document Revised: 07/04/2019 Document Reviewed: 07/04/2019 Elsevier Patient Education  2023 Elsevier Inc.  

## 2022-05-11 DIAGNOSIS — N2581 Secondary hyperparathyroidism of renal origin: Secondary | ICD-10-CM | POA: Diagnosis not present

## 2022-05-11 DIAGNOSIS — Z992 Dependence on renal dialysis: Secondary | ICD-10-CM | POA: Diagnosis not present

## 2022-05-11 DIAGNOSIS — N186 End stage renal disease: Secondary | ICD-10-CM | POA: Diagnosis not present

## 2022-05-12 ENCOUNTER — Ambulatory Visit (INDEPENDENT_AMBULATORY_CARE_PROVIDER_SITE_OTHER): Payer: Medicare HMO | Admitting: Nurse Practitioner

## 2022-05-12 ENCOUNTER — Encounter: Payer: Self-pay | Admitting: Nurse Practitioner

## 2022-05-12 VITALS — BP 132/71 | HR 76 | Temp 97.7°F | Ht 75.0 in | Wt 232.0 lb

## 2022-05-12 DIAGNOSIS — E441 Mild protein-calorie malnutrition: Secondary | ICD-10-CM

## 2022-05-12 DIAGNOSIS — E785 Hyperlipidemia, unspecified: Secondary | ICD-10-CM

## 2022-05-12 DIAGNOSIS — N400 Enlarged prostate without lower urinary tract symptoms: Secondary | ICD-10-CM | POA: Diagnosis not present

## 2022-05-12 DIAGNOSIS — N2581 Secondary hyperparathyroidism of renal origin: Secondary | ICD-10-CM | POA: Diagnosis not present

## 2022-05-12 DIAGNOSIS — G894 Chronic pain syndrome: Secondary | ICD-10-CM

## 2022-05-12 DIAGNOSIS — E1169 Type 2 diabetes mellitus with other specified complication: Secondary | ICD-10-CM | POA: Diagnosis not present

## 2022-05-12 DIAGNOSIS — D631 Anemia in chronic kidney disease: Secondary | ICD-10-CM

## 2022-05-12 DIAGNOSIS — Z992 Dependence on renal dialysis: Secondary | ICD-10-CM | POA: Diagnosis not present

## 2022-05-12 DIAGNOSIS — Z89611 Acquired absence of right leg above knee: Secondary | ICD-10-CM

## 2022-05-12 DIAGNOSIS — I132 Hypertensive heart and chronic kidney disease with heart failure and with stage 5 chronic kidney disease, or end stage renal disease: Secondary | ICD-10-CM

## 2022-05-12 DIAGNOSIS — N185 Chronic kidney disease, stage 5: Secondary | ICD-10-CM | POA: Diagnosis not present

## 2022-05-12 DIAGNOSIS — I2510 Atherosclerotic heart disease of native coronary artery without angina pectoris: Secondary | ICD-10-CM

## 2022-05-12 DIAGNOSIS — I482 Chronic atrial fibrillation, unspecified: Secondary | ICD-10-CM | POA: Diagnosis not present

## 2022-05-12 DIAGNOSIS — E1122 Type 2 diabetes mellitus with diabetic chronic kidney disease: Secondary | ICD-10-CM

## 2022-05-12 DIAGNOSIS — I5020 Unspecified systolic (congestive) heart failure: Secondary | ICD-10-CM

## 2022-05-12 DIAGNOSIS — N186 End stage renal disease: Secondary | ICD-10-CM | POA: Diagnosis not present

## 2022-05-12 DIAGNOSIS — F5101 Primary insomnia: Secondary | ICD-10-CM

## 2022-05-12 DIAGNOSIS — E669 Obesity, unspecified: Secondary | ICD-10-CM

## 2022-05-12 DIAGNOSIS — D6869 Other thrombophilia: Secondary | ICD-10-CM | POA: Diagnosis not present

## 2022-05-12 MED ORDER — TRAZODONE HCL 50 MG PO TABS: 25.0000 mg | ORAL_TABLET | Freq: Every evening | ORAL | 6 refills | Status: DC | PRN

## 2022-05-12 NOTE — Assessment & Plan Note (Signed)
Chronic, ongoing.  Continue current medication regimen and collaboration with local cardiology office.  Return to office in 3 months.  Have highly recommend he schedule follow-up with cardiology, has not returned since August 2021 -- new referral placed to return.

## 2022-05-12 NOTE — Assessment & Plan Note (Signed)
Chronic, ongoing. Continue current medication regimen and adjust as needed.  Lipid panel today. 

## 2022-05-12 NOTE — Progress Notes (Signed)
BP 132/71   Pulse 76   Temp 97.7 F (36.5 C) (Oral)   Ht '6\' 3"'$  (1.905 m)   Wt 232 lb (105.2 kg)   SpO2 97%   BMI 29.00 kg/m    Subjective:    [name], male    DOB: 1960/11/14, 62 y.o.   MRN: [id number]  HPI: Adam Howell is a 62 y.o. male  Chief Complaint  Patient presents with   Diabetes   S/P Right AKA    Patient still has staples in incision    DIABETES Continues Levemir 20 units daily.  A1c in January 4.7%.  Range since August 2020 -- 5.6 to 6.8%.  Currently not taking any diabetes medicine, previously took Trulicity and insulin.  Had above knee amputation to right leg on 03/13/22 -- staples removed by CMA today.  Did do physical therapy time, none since leaving hospital.  Follows up with surgeon in March 11th, he is interested in prosthetic leg.   Hypoglycemic episodes:yes Polydipsia/polyuria: no Visual disturbance: no Chest pain: no Paresthesias: no Glucose Monitoring: yes             Accucheck frequency: not recently             Fasting glucose:              Post prandial:             Evening:             Before meals: Taking Insulin?: yes             Long acting insulin:              Short acting insulin:  Blood Pressure Monitoring: not checking Retinal Examination: Not up to Date Foot Exam: Up To Date Pneumovax: Up to Date  Influenza: Up to Date Aspirin: no    HYPERTENSION / HYPERLIPIDEMIA/HF Taking Eliquis, Amiodarone, Renvela, and Bumex. Takes Crestor daily for HLD.     He saw Dr. Saunders Revel with cardiology on 11/10/19 -- has not returned since this time, missed follow-up.  Recent EF on 01/10/20 was 35-40%.   Satisfied with current treatment? yes Duration of hypertension: chronic BP monitoring frequency: not checking BP range:  BP medication side effects: no Duration of hyperlipidemia: chronic Cholesterol medication side effects: no Cholesterol supplements: none Medication compliance: good compliance Aspirin: no Recent  stressors: no Recurrent headaches: no Visual changes: no Palpitations: no Dyspnea: no Chest pain: no Lower extremity edema: at baseline Dizzy/lightheaded: no    CHRONIC KIDNEY DISEASE End stage renal disease and obtains dialysis.  Goes to dialysis Monday, Wednesday, Friday.  Sees nephrology in clinic.  He reports taking Norco, has been on long term, for post dialysis discomfort.   Reviewed PDMP last fill Ambien 11/19/21 for 30 tablets and Norco 10-325 04/13/22. CKD status: stable Medications renally dose: yes Previous renal evaluation: yes Pneumovax:  Up to Date Influenza Vaccine:  Up to Date    INSOMNIA Used to take Trazodone and Ambien.  These were stopped in hospital.  Currently has trouble falling asleep. Duration: chronic Satisfied with sleep quality: yes Difficulty falling asleep: yes Difficulty staying asleep: no Waking a few hours after sleep onset: no Early morning awakenings: no Daytime hypersomnolence: no Wakes feeling refreshed: yes Good sleep hygiene: yes Apnea: no Snoring: no Depressed/anxious mood: no Recent stress: no Restless legs/nocturnal leg cramps: no Chronic pain/arthritis: no History of sleep study: no Treatments attempted: [place]   Relevant past medical,  surgical, family and social history reviewed and updated as indicated. Interim medical history since our last visit reviewed. Allergies and medications reviewed and updated.  Review of Systems  Constitutional:  Negative for activity change, diaphoresis, fatigue and fever.  Respiratory:  Negative for cough, chest tightness, shortness of breath and wheezing.   Cardiovascular:  Negative for chest pain, palpitations and leg swelling.  Gastrointestinal: Negative.   Endocrine: Negative for polydipsia, polyphagia and polyuria.  Neurological: Negative.   Psychiatric/Behavioral: Negative.      Per HPI unless specifically indicated above     Objective:    BP 132/71   Pulse 76   Temp 97.7 F (36.5  C) (Oral)   Ht '6\' 3"'$  (1.905 m)   Wt 232 lb (105.2 kg)   SpO2 97%   BMI 29.00 kg/m   Wt Readings from Last 3 Encounters:  05/12/22 232 lb (105.2 kg)  04/04/22 231 lb 7.7 oz (105 kg)  03/19/22 235 lb 3.7 oz (106.7 kg)    Physical Exam Vitals and nursing note reviewed.  Constitutional:      General: He is awake. He is not in acute distress.    Appearance: He is well-developed and well-groomed. He is obese. He is not ill-appearing or toxic-appearing.  HENT:     Head: Normocephalic and atraumatic.     Right Ear: Hearing normal. No drainage.     Left Ear: Hearing normal. No drainage.  Eyes:     General: Lids are normal.        Right eye: No discharge.        Left eye: No discharge.     Conjunctiva/sclera: Conjunctivae normal.     Pupils: Pupils are equal, round, and reactive to light.  Neck:     Thyroid: No thyromegaly.     Vascular: No carotid bruit.     Trachea: Trachea normal.  Cardiovascular:     Rate and Rhythm: Normal rate and regular rhythm.     Pulses:          Dorsalis pedis pulses are 1+ on the right side and 1+ on the left side.       Posterior tibial pulses are 1+ on the right side and 1+ on the left side.     Heart sounds: Normal heart sounds, S1 normal and S2 normal. No murmur heard.    No gallop.     Arteriovenous access: Left arteriovenous access is present.    Comments: Decreased hair pattern bilateral legs. Pulmonary:     Effort: Pulmonary effort is normal. No accessory muscle usage or respiratory distress.     Breath sounds: Normal breath sounds.  Abdominal:     General: Bowel sounds are normal. There is no distension.     Palpations: Abdomen is soft.     Tenderness: There is no abdominal tenderness.  Musculoskeletal:        General: Normal range of motion.     Cervical back: Normal range of motion and neck supple.     Left lower leg: No edema.     Right Lower Extremity: Right leg is amputated above knee.  Skin:    General: Skin is warm and dry.      Capillary Refill: Capillary refill takes less than 2 seconds.     Findings: No rash.  Neurological:     Mental Status: [name] and oriented to person, place, and time.     Deep Tendon Reflexes: Reflexes are normal and symmetric.  Psychiatric:  Attention and Perception: Attention normal.        Mood and Affect: Mood normal.        Speech: Speech normal.        Behavior: Behavior normal. Behavior is cooperative.        Thought Content: Thought content normal.        Judgment: Judgment normal.     Results for orders placed or performed during the hospital encounter of [id number]  Basic metabolic panel  Result Value Ref Range   Sodium 138 135 - 145 mmol/L   Potassium 3.5 3.5 - 5.1 mmol/L   Chloride 98 98 - 111 mmol/L   CO2 28 22 - 32 mmol/L   Glucose, Bld 92 70 - 99 mg/dL   BUN 33 (H) 6 - 20 mg/dL   Creatinine, Ser 7.18 (H) 0.61 - 1.24 mg/dL   Calcium 7.5 (L) 8.9 - 10.3 mg/dL   GFR, Estimated 8 (L) >60 mL/min   Anion gap 12 5 - 15  CBC with Differential  Result Value Ref Range   WBC 5.6 4.0 - 10.5 K/uL   RBC 3.40 (L) 4.22 - 5.81 MIL/uL   Hemoglobin 9.9 (L) 13.0 - 17.0 g/dL   HCT 32.1 (L) 39.0 - 52.0 %   MCV 94.4 80.0 - 100.0 fL   MCH 29.1 26.0 - 34.0 pg   MCHC 30.8 30.0 - 36.0 g/dL   RDW 18.0 (H) 11.5 - 15.5 %   Platelets 192 150 - 400 K/uL   nRBC 0.0 0.0 - 0.2 %   Neutrophils Relative % 62 %   Neutro Abs 3.4 1.7 - 7.7 K/uL   Lymphocytes Relative 18 %   Lymphs Abs 1.0 0.7 - 4.0 K/uL   Monocytes Relative 9 %   Monocytes Absolute 0.5 0.1 - 1.0 K/uL   Eosinophils Relative 9 %   Eosinophils Absolute 0.5 0.0 - 0.5 K/uL   Basophils Relative 2 %   Basophils Absolute 0.1 0.0 - 0.1 K/uL   Immature Granulocytes 0 %   Abs Immature Granulocytes 0.02 0.00 - 0.07 K/uL      Assessment & Plan:   Problem List Items Addressed This Visit       Cardiovascular and Mediastinum   Atherosclerotic heart disease of native coronary artery without angina pectoris    Chronic,  ongoing issue with no recent CP or NTG use.  Have highly recommend he schedule follow-up with cardiology, has not returned to visit since August 2021 -- new referral placed to return.      Chronic atrial fibrillation with RVR (HCC)    Chronic, ongoing.  Continue current medication regimen and collaboration with local cardiology office.  Return to office in 3 months.  Have highly recommend he schedule follow-up with cardiology, has not returned since August 2021 -- new referral placed to return.      Heart failure with reduced ejection fraction (HCC)    Chronic, stable, euvolemic today.  Continue current medication regimen and adjust as needed.  Continue collaboration with cardiology. Have highly recommend he schedule follow-up with cardiology, last visit August 2021 -- will place new referral. - Reminded to call for an overnight weight gain of >2 pounds or a weekly weight weight of >5 pounds - not adding salt to his food and has been reading food labels. Reviewed the importance of keeping daily sodium intake to '2000mg'$  daily  - Avoid NSAIDS      Relevant Orders   Ambulatory referral to Cardiology   Hypertensive  heart and kidney disease with heart failure and chronic kidney disease stage V (HCC)    Chronic, ongoing, followed by dialysis team.  BP at goal in office today.  Continue current medication regimen as prescribed by them.  Attempt to obtain recent notes from [place].  Labs today: CBC, CMP and Lipid.  Recommend he monitor BP at home a few days a week and document + focus on DASH diet.  Recent nephrology notes reviewed.  Return to office in 3 months.       Relevant Orders   TSH   Ambulatory referral to Cardiology     Endocrine   Hyperlipidemia associated with type 2 diabetes mellitus (HCC)    Chronic, ongoing.  Continue current medication regimen and adjust as needed.  Lipid panel today.         Relevant Orders   Lipid Panel w/o Chol/HDL Ratio   Secondary hyperparathyroidism of  renal origin ([place])    Chronic, ongoing with ESRD.  Continue collaboration with dialysis team and nephrology, attempt to obtain recent notes from [place].      Type 2 diabetes mellitus with ESRD (end-stage renal disease) ([place]) - Primary    Chronic, ongoing with A1c 4.7 in January.  At goal.  Continue off medications at this time.  Recommend he monitor BS at least a couple times a day and document for provider + focus on diabetic diet.  Return to office in 3 months for follow-up.  He was unable to obtain urine sample today for ALB check.        Genitourinary   Anemia in chronic kidney disease    Chronic, ongoing, followed by dialysis teams.  Continue current medication regimen as prescribed by them.  Attempt to obtain recent notes from [place].  Return in 3 months.      ESRD on dialysis [facility])    Chronic, ongoing, followed by nephrology and dialysis teams.  Unsure about diet compliance as tends to eat fast food on occasion.  Continue current medication regimen as prescribed, refills sent.  Attempt to obtain recent notes from [place].  Return to office in 3 months for follow-up.        Relevant Orders   Comprehensive metabolic panel     Hematopoietic and Hemostatic   Other thrombophilia ([place])    With atrial fibrillation and on Eliquis.  Continue to collaborate with cardiology and continue current medication regimen.      Relevant Orders   CBC with Differential/Platelet     Other   Insomnia    Chronic, ongoing.  Will restart Trazodone at low dose.  Try to avoid return to Ambien unless needed, has controlled subs agreement on chart.  Adjust regimen as needed.  Return in 3 months.      Obesity (BMI 30-39.9)    BMI 29.00, some loss since surgery, monitor.  Recommended eating smaller high protein, low fat meals more frequently and exercising 30 mins a day 5 times a week with a goal of 10-15lb weight loss in the next 3 months. Patient voiced their understanding and motivation to adhere to  these recommendations.       S/P AKA (above knee amputation), right ([place])    Performed 03/13/22.  Scheduled to follow-up with vascular upcoming and he will discuss with them prosthetic.       Other Visit Diagnoses     Benign prostatic hyperplasia without lower urinary tract symptoms       Check PSA on labs today.   Relevant  Orders   PSA        Follow up plan: Return in about 3 months (around 08/10/2022) for T2DM, HTN/HLD, INSOMNIA.

## 2022-05-12 NOTE — Assessment & Plan Note (Signed)
Chronic, ongoing.  Will restart Trazodone at low dose.  Try to avoid return to Ambien unless needed, has controlled subs agreement on chart.  Adjust regimen as needed.  Return in 3 months.

## 2022-05-12 NOTE — Assessment & Plan Note (Signed)
Chronic, ongoing, followed by nephrology and dialysis teams.  Unsure about diet compliance as tends to eat fast food on occasion.  Continue current medication regimen as prescribed, refills sent.  Attempt to obtain recent notes from [place].  Return to office in 3 months for follow-up.

## 2022-05-12 NOTE — Assessment & Plan Note (Signed)
Chronic, ongoing with A1c 4.7 in January.  At goal.  Continue off medications at this time.  Recommend he monitor BS at least a couple times a day and document for provider + focus on diabetic diet.  Return to office in 3 months for follow-up.  He was unable to obtain urine sample today for ALB check.

## 2022-05-12 NOTE — Assessment & Plan Note (Signed)
Chronic, ongoing issue with no recent CP or NTG use.  Have highly recommend he schedule follow-up with cardiology, has not returned to visit since August 2021 -- new referral placed to return.

## 2022-05-12 NOTE — Assessment & Plan Note (Signed)
Chronic, ongoing, followed by dialysis team.  BP at goal in office today.  Continue current medication regimen as prescribed by them.  Attempt to obtain recent notes from [place].  Labs today: CBC, CMP and Lipid.  Recommend he monitor BP at home a few days a week and document + focus on DASH diet.  Recent nephrology notes reviewed.  Return to office in 3 months.

## 2022-05-12 NOTE — Assessment & Plan Note (Signed)
Chronic, ongoing, followed by dialysis teams.  Continue current medication regimen as prescribed by them.  Attempt to obtain recent notes from [place].  Return in 3 months.

## 2022-05-12 NOTE — Assessment & Plan Note (Signed)
With atrial fibrillation and on Eliquis.  Continue to collaborate with cardiology and continue current medication regimen.

## 2022-05-12 NOTE — Assessment & Plan Note (Signed)
Chronic, stable, euvolemic today.  Continue current medication regimen and adjust as needed.  Continue collaboration with cardiology. Have highly recommend he schedule follow-up with cardiology, last visit August 2021 -- will place new referral. - Reminded to call for an overnight weight gain of >2 pounds or a weekly weight weight of >5 pounds - not adding salt to his food and has been reading food labels. Reviewed the importance of keeping daily sodium intake to '2000mg'$  daily  - Avoid NSAIDS

## 2022-05-12 NOTE — Assessment & Plan Note (Signed)
Chronic, ongoing with ESRD.  Continue collaboration with dialysis team and nephrology, attempt to obtain recent notes from [place].

## 2022-05-12 NOTE — Assessment & Plan Note (Signed)
BMI 29.00, some loss since surgery, monitor.  Recommended eating smaller high protein, low fat meals more frequently and exercising 30 mins a day 5 times a week with a goal of 10-15lb weight loss in the next 3 months. Patient voiced their understanding and motivation to adhere to these recommendations.

## 2022-05-12 NOTE — Assessment & Plan Note (Signed)
Performed 03/13/22.  Scheduled to follow-up with vascular upcoming and he will discuss with them prosthetic.

## 2022-05-13 DIAGNOSIS — N186 End stage renal disease: Secondary | ICD-10-CM | POA: Diagnosis not present

## 2022-05-13 DIAGNOSIS — N2581 Secondary hyperparathyroidism of renal origin: Secondary | ICD-10-CM | POA: Diagnosis not present

## 2022-05-13 DIAGNOSIS — Z992 Dependence on renal dialysis: Secondary | ICD-10-CM | POA: Diagnosis not present

## 2022-05-13 LAB — COMPREHENSIVE METABOLIC PANEL
ALT: 23 IU/L (ref 0–44)
AST: 30 IU/L (ref 0–40)
Albumin/Globulin Ratio: 1.2 (ref 1.2–2.2)
Albumin: 4.3 g/dL (ref 3.8–4.9)
Alkaline Phosphatase: 94 IU/L (ref 44–121)
BUN/Creatinine Ratio: 6 — ABNORMAL LOW (ref 10–24)
BUN: 53 mg/dL — ABNORMAL HIGH (ref 8–27)
Bilirubin Total: 0.5 mg/dL (ref 0.0–1.2)
CO2: 24 mmol/L (ref 20–29)
Calcium: 8 mg/dL — ABNORMAL LOW (ref 8.6–10.2)
Chloride: 92 mmol/L — ABNORMAL LOW (ref 96–106)
Creatinine, Ser: 9.54 mg/dL — ABNORMAL HIGH (ref 0.76–1.27)
Globulin, Total: 3.5 g/dL (ref 1.5–4.5)
Glucose: 106 mg/dL — ABNORMAL HIGH (ref 70–99)
Potassium: 5.6 mmol/L — ABNORMAL HIGH (ref 3.5–5.2)
Sodium: 138 mmol/L (ref 134–144)
Total Protein: 7.8 g/dL (ref 6.0–8.5)
eGFR: 6 mL/min/{1.73_m2} — ABNORMAL LOW (ref >59)

## 2022-05-13 LAB — LIPID PANEL W/O CHOL/HDL RATIO
Cholesterol, Total: 174 mg/dL (ref 100–199)
HDL: 72 mg/dL (ref >39)
LDL Chol Calc (NIH): 87 mg/dL (ref 0–99)
Triglycerides: 82 mg/dL (ref 0–149)
VLDL Cholesterol Cal: 15 mg/dL (ref 5–40)

## 2022-05-13 LAB — CBC WITH DIFFERENTIAL/PLATELET
Basophils Absolute: 0.1 10*3/uL (ref 0.0–0.2)
Basos: 2 %
EOS (ABSOLUTE): 0.8 10*3/uL — ABNORMAL HIGH (ref 0.0–0.4)
Eos: 14 %
Hematocrit: 37.1 % — ABNORMAL LOW (ref 37.5–51.0)
Hemoglobin: 12 g/dL — ABNORMAL LOW (ref 13.0–17.7)
Immature Grans (Abs): 0 10*3/uL (ref 0.0–0.1)
Immature Granulocytes: 0 %
Lymphocytes Absolute: 1.3 10*3/uL (ref 0.7–3.1)
Lymphs: 25 %
MCH: 28.7 pg (ref 26.6–33.0)
MCHC: 32.3 g/dL (ref 31.5–35.7)
MCV: 89 fL (ref 79–97)
Monocytes Absolute: 0.4 10*3/uL (ref 0.1–0.9)
Monocytes: 7 %
Neutrophils Absolute: 2.7 10*3/uL (ref 1.4–7.0)
Neutrophils: 52 %
Platelets: 175 10*3/uL (ref 150–450)
RBC: 4.18 x10E6/uL (ref 4.14–5.80)
RDW: 15.3 % (ref 11.6–15.4)
WBC: 5.3 10*3/uL (ref 3.4–10.8)

## 2022-05-13 LAB — TSH: TSH: 4.09 u[IU]/mL (ref 0.450–4.500)

## 2022-05-13 LAB — PSA: Prostate Specific Ag, Serum: 0.6 ng/mL (ref 0.0–4.0)

## 2022-05-13 NOTE — Progress Notes (Signed)
Good morning, please let Orie know his labs have returned: - CBC shows ongoing mild anemia with his kidney disease which they will continue to monitor closely at dialysis.   - Kidney function remains at baseline for him and potassium mildly elevated, which they will monitor at dialysis and adjust levels as needed. Calcium low, continue your Calcitriol. - Remainder of his labs are all stable, including prostate screening lab.  No changes needed.  Any questions?  Please ensure that you schedule with cardiology when they call. Keep being awesome!!  Thank you for allowing me to participate in your care.  I appreciate you. Kindest regards, Renny Remer

## 2022-05-14 DIAGNOSIS — I120 Hypertensive chronic kidney disease with stage 5 chronic kidney disease or end stage renal disease: Secondary | ICD-10-CM | POA: Diagnosis not present

## 2022-05-14 DIAGNOSIS — N186 End stage renal disease: Secondary | ICD-10-CM | POA: Diagnosis not present

## 2022-05-14 DIAGNOSIS — Z992 Dependence on renal dialysis: Secondary | ICD-10-CM | POA: Diagnosis not present

## 2022-05-15 ENCOUNTER — Telehealth: Payer: Self-pay | Admitting: *Deleted

## 2022-05-15 DIAGNOSIS — Z992 Dependence on renal dialysis: Secondary | ICD-10-CM | POA: Diagnosis not present

## 2022-05-15 DIAGNOSIS — N2581 Secondary hyperparathyroidism of renal origin: Secondary | ICD-10-CM | POA: Diagnosis not present

## 2022-05-15 DIAGNOSIS — N186 End stage renal disease: Secondary | ICD-10-CM | POA: Diagnosis not present

## 2022-05-15 NOTE — Telephone Encounter (Signed)
Patient returned call and notified :  Good morning, please let Michio know his labs have returned: - CBC shows ongoing mild anemia with his kidney disease which they will continue to monitor closely at dialysis.   - Kidney function remains at baseline for him and potassium mildly elevated, which they will monitor at dialysis and adjust levels as needed. Calcium low, continue your Calcitriol. - Remainder of his labs are all stable, including prostate screening lab.  No changes needed.  Any questions?  Please ensure that you schedule with cardiology when they call. Keep being awesome!!  Thank you for allowing me to participate in your care.  I appreciate you. Kindest regards, Jolene

## 2022-05-18 DIAGNOSIS — N186 End stage renal disease: Secondary | ICD-10-CM | POA: Diagnosis not present

## 2022-05-18 DIAGNOSIS — N2581 Secondary hyperparathyroidism of renal origin: Secondary | ICD-10-CM | POA: Diagnosis not present

## 2022-05-18 DIAGNOSIS — Z992 Dependence on renal dialysis: Secondary | ICD-10-CM | POA: Diagnosis not present

## 2022-05-20 DIAGNOSIS — Z992 Dependence on renal dialysis: Secondary | ICD-10-CM | POA: Diagnosis not present

## 2022-05-20 DIAGNOSIS — N186 End stage renal disease: Secondary | ICD-10-CM | POA: Diagnosis not present

## 2022-05-20 DIAGNOSIS — N2581 Secondary hyperparathyroidism of renal origin: Secondary | ICD-10-CM | POA: Diagnosis not present

## 2022-05-21 ENCOUNTER — Other Ambulatory Visit (INDEPENDENT_AMBULATORY_CARE_PROVIDER_SITE_OTHER): Payer: Self-pay | Admitting: Nurse Practitioner

## 2022-05-21 ENCOUNTER — Other Ambulatory Visit: Payer: Self-pay | Admitting: Nurse Practitioner

## 2022-05-21 DIAGNOSIS — N186 End stage renal disease: Secondary | ICD-10-CM

## 2022-05-21 DIAGNOSIS — T829XXS Unspecified complication of cardiac and vascular prosthetic device, implant and graft, sequela: Secondary | ICD-10-CM

## 2022-05-21 MED ORDER — HYDROCODONE-ACETAMINOPHEN 10-325 MG PO TABS: 1.0000 | ORAL_TABLET | Freq: Three times a day (TID) | ORAL | 0 refills | Status: AC | PRN

## 2022-05-21 NOTE — Telephone Encounter (Signed)
Requested medication (s) are due for refill today: expired medication  Requested medication (s) are on the active medication list: no   Last refill:  na  Future visit scheduled: yes in 2 months   Notes to clinic:  expired medication. 05/13/22. Do you want to order Rx? Not delegated per protocol     Requested Prescriptions  Pending Prescriptions Disp Refills   HYDROcodone-acetaminophen (NORCO) 10-325 MG tablet 90 tablet 0    Sig: Take 1 tablet by mouth every 8 (eight) hours as needed.     Not Delegated - Analgesics:  Opioid Agonist Combinations Failed - 05/21/2022  3:28 PM      Failed - This refill cannot be delegated      Failed - Urine Drug Screen completed in last 360 days      Passed - Valid encounter within last 3 months    Recent Outpatient Visits           1 week ago Type 2 diabetes mellitus with ESRD (end-stage renal disease) ([place])   [place], [place] T, NP   1 month ago S/P AKA (above knee amputation), right ([place])   [place], Megan P, DO   3 months ago Chronic pain syndrome   [place], Geronimo T, NP   3 months ago Diabetic ulcer of right lower leg ([place])   [place], [place] T, NP   3 months ago Ulcer of right lower extremity, unspecified ulcer stage ([place])   [place], PA-C       Future Appointments             In 1 month [place], Harrell Gave, MD [place] [facility] [place]   In 2 months [place], Barbaraann Faster, NP [place], [place]

## 2022-05-21 NOTE — Telephone Encounter (Signed)
Medication Refill - Medication: HYDROcodone-acetaminophen (NORCO) 10-325 MG tablet   Has the patient contacted their pharmacy? yes (Agent: If no, request that the patient contact the pharmacy for the refill. If patient does not wish to contact the pharmacy document the reason why and proceed with request.) (Agent: If yes, when and what did the pharmacy advise?)contact pcp  Preferred Pharmacy (with phone number or street name):  [facility] [place] - [address] Phone: [phone]  Fax: [phone]     Has the patient been seen for an appointment in the last year OR does the patient have an upcoming appointment? yes  Agent: Please be advised that RX refills may take up to 3 business days. We ask that you follow-up with your pharmacy.

## 2022-05-22 ENCOUNTER — Telehealth (INDEPENDENT_AMBULATORY_CARE_PROVIDER_SITE_OTHER): Payer: Self-pay

## 2022-05-22 DIAGNOSIS — Z992 Dependence on renal dialysis: Secondary | ICD-10-CM | POA: Diagnosis not present

## 2022-05-22 DIAGNOSIS — N2581 Secondary hyperparathyroidism of renal origin: Secondary | ICD-10-CM | POA: Diagnosis not present

## 2022-05-22 DIAGNOSIS — N186 End stage renal disease: Secondary | ICD-10-CM | POA: Diagnosis not present

## 2022-05-22 NOTE — Telephone Encounter (Signed)
LVM for pt TCB to office in regards to his VM left last night. I did advise on VM that he has an appt with [place] on Monday 3.11.24 at 1:00 for [place] and f/u with provider after.

## 2022-05-23 NOTE — Progress Notes (Deleted)
MRN : [id number]  Adam Howell is a 62 y.o. (1960-09-27) male who presents with chief complaint of check access.  History of Present Illness:  The patient returns to the office for follow up regarding a problem with their dialysis access.   The patient notes a significant increase in bleeding time after decannulation.  The patient has also been informed that there is increased recirculation.    The patient denies hand pain or other symptoms consistent with steal phenomena.  No significant arm swelling.  The patient denies redness or swelling at the access site. The patient denies fever or chills at home or while on dialysis.  No recent shortening of the patient's walking distance or new symptoms consistent with claudication.  No history of rest pain symptoms. No new ulcers or wounds of the lower extremities have occurred.  The patient denies amaurosis fugax or recent TIA symptoms. There are no recent neurological changes noted. There is no history of DVT, PE or superficial thrombophlebitis. No recent episodes of angina or shortness of breath documented.   Duplex ultrasound of the AV access shows a patent access.  The previously noted stenosis is significantly increased compared to last study.  Flow volume today is *** cc/min (previous flow volume was *** cc/min)   No outpatient medications have been marked as taking for the 05/25/22 encounter (Appointment) with Delana Meyer, Dolores Lory, MD.    Past Medical History:  Diagnosis Date   Chronic kidney disease    Congestive heart failure ([place])    Diabetes mellitus without complication ([place])    Difficult intubation    Hyperlipidemia    Hypertension     Past Surgical History:  Procedure Laterality Date   AMPUTATION Right 03/13/2022   Procedure: AMPUTATION ABOVE KNEE;  Surgeon: Algernon Huxley, MD;  Location: [facility];  Service: General;  Laterality: Right;   APPLICATION OF WOUND VAC Right 03/07/2022    Procedure: APPLICATION OF WOUND VAC;  Surgeon: Evaristo Bury, MD;  Location: [facility];  Service: Vascular;  Laterality: Right;  GAAC [id number]   WOUND DEBRIDEMENT Left    WOUND DEBRIDEMENT Right 01/16/2022   Procedure: DEBRIDEMENT WOUND;  Surgeon: Katha Cabal, MD;  Location: [facility];  Service: Vascular;  Laterality: Right;  Wound vac placement   WOUND DEBRIDEMENT Right 03/07/2022   Procedure: DEBRIDEMENT WOUND RIGHT LOWER EXTREMITY;  Surgeon: Evaristo Bury, MD;  Location: [facility];  Service: Vascular;  Laterality: Right;    Social History Social History   Tobacco Use   Smoking status: Never   Smokeless tobacco: Never  Vaping Use   Vaping Use: Never used  Substance Use Topics   Alcohol use: Not Currently   Drug use: Never    Family History Family History  Problem Relation Age of Onset   Heart failure Mother    Cirrhosis Father    Hypertension Daughter     Allergies  Allergen Reactions   Tramadol Rash     REVIEW OF SYSTEMS (Negative unless checked)  Constitutional: '[]'$ Weight loss  '[]'$ Fever  '[]'$ Chills Cardiac: '[]'$ Chest pain   '[]'$ Chest pressure   '[]'$ Palpitations   '[]'$ Shortness of breath when laying flat   '[]'$ Shortness of breath with exertion. Vascular:  '[]'$ Pain in legs with walking   '[]'$ Pain in legs at rest  '[]'$ History of DVT   '[]'$ Phlebitis   '[]'$ Swelling in legs   '[]'$ Varicose  veins   '[]'$ Non-healing ulcers Pulmonary:   '[]'$ Uses home oxygen   '[]'$ Productive cough   '[]'$ Hemoptysis   '[]'$ Wheeze  '[]'$ COPD   '[]'$ Asthma Neurologic:  '[]'$ Dizziness   '[]'$ Seizures   '[]'$ History of stroke   '[]'$ History of TIA  '[]'$ Aphasia   '[]'$ Vissual changes   '[]'$ Weakness or numbness in arm   '[]'$ Weakness or numbness in leg Musculoskeletal:   '[]'$ Joint swelling   '[]'$ Joint pain   '[]'$ Low back pain Hematologic:  '[]'$ Easy bruising  '[]'$ Easy bleeding   '[]'$ Hypercoagulable state   '[]'$ Anemic Gastrointestinal:  '[]'$ Diarrhea   '[]'$ Vomiting  '[]'$ Gastroesophageal reflux/heartburn   '[]'$ Difficulty swallowing. Genitourinary:  '[x]'$ Chronic kidney disease   '[]'$ Difficult  urination  '[]'$ Frequent urination   '[]'$ Blood in urine Skin:  '[]'$ Rashes   '[]'$ Ulcers  Psychological:  '[]'$ History of anxiety   '[]'$  History of major depression.  Physical Examination  There were no vitals filed for this visit. There is no height or weight on file to calculate BMI. Gen: WD/WN, NAD Head: [place]/AT, No temporalis wasting.  Ear/Nose/Throat: Hearing grossly intact, nares w/o erythema or drainage Eyes: PER, EOMI, sclera nonicteric.  Neck: Supple, no gross masses or lesions.  No JVD.  Pulmonary:  Good air movement, no audible wheezing, no use of accessory muscles.  Cardiac: RRR, precordium non-hyperdynamic. Vascular:   *** Vessel Right Left  Radial Palpable Palpable  Brachial Palpable Palpable  Gastrointestinal: soft, non-distended. No guarding/no peritoneal signs.  Musculoskeletal: M/S 5/5 throughout.  No deformity.  Neurologic: CN 2-12 intact. Pain and light touch intact in extremities.  Symmetrical.  Speech is fluent. Motor exam as listed above. Psychiatric: Judgment intact, Mood & affect appropriate for pt's clinical situation. Dermatologic: No rashes or ulcers noted.  No changes consistent with cellulitis.   CBC Lab Results  Component Value Date   WBC 5.3 05/12/2022   HGB 12.0 (L) 05/12/2022   HCT 37.1 (L) 05/12/2022   MCV 89 05/12/2022   PLT 175 05/12/2022    BMET    Component Value Date/Time   NA 138 05/12/2022 1405   K 5.6 (H) 05/12/2022 1405   CL 92 (L) 05/12/2022 1405   CO2 24 05/12/2022 1405   GLUCOSE 106 (H) 05/12/2022 1405   GLUCOSE 92 04/17/2022 0727   BUN 53 (H) 05/12/2022 1405   CREATININE 9.54 (H) 05/12/2022 1405   CALCIUM 8.0 (L) 05/12/2022 1405   GFRNONAA 8 (L) 04/17/2022 0727   Estimated Creatinine Clearance: 10.8 mL/min (A) (by C-G formula based on SCr of 9.54 mg/dL (H)).  COAG Lab Results  Component Value Date   INR 1.2 03/05/2022    Radiology No results found.   Assessment/Plan There are no diagnoses linked to this  encounter.   Hortencia Pilar, MD  05/23/2022 2:53 PM

## 2022-05-25 ENCOUNTER — Encounter (INDEPENDENT_AMBULATORY_CARE_PROVIDER_SITE_OTHER): Payer: Medicare Other

## 2022-05-25 ENCOUNTER — Encounter (INDEPENDENT_AMBULATORY_CARE_PROVIDER_SITE_OTHER): Payer: Medicare Other | Admitting: Vascular Surgery

## 2022-05-25 DIAGNOSIS — Z992 Dependence on renal dialysis: Secondary | ICD-10-CM | POA: Diagnosis not present

## 2022-05-25 DIAGNOSIS — N186 End stage renal disease: Secondary | ICD-10-CM

## 2022-05-25 DIAGNOSIS — N2581 Secondary hyperparathyroidism of renal origin: Secondary | ICD-10-CM | POA: Diagnosis not present

## 2022-05-25 DIAGNOSIS — I2510 Atherosclerotic heart disease of native coronary artery without angina pectoris: Secondary | ICD-10-CM

## 2022-05-25 DIAGNOSIS — E1169 Type 2 diabetes mellitus with other specified complication: Secondary | ICD-10-CM

## 2022-05-25 DIAGNOSIS — E1122 Type 2 diabetes mellitus with diabetic chronic kidney disease: Secondary | ICD-10-CM

## 2022-05-27 DIAGNOSIS — N2581 Secondary hyperparathyroidism of renal origin: Secondary | ICD-10-CM | POA: Diagnosis not present

## 2022-05-27 DIAGNOSIS — N186 End stage renal disease: Secondary | ICD-10-CM | POA: Diagnosis not present

## 2022-05-27 DIAGNOSIS — Z992 Dependence on renal dialysis: Secondary | ICD-10-CM | POA: Diagnosis not present

## 2022-05-29 DIAGNOSIS — N186 End stage renal disease: Secondary | ICD-10-CM | POA: Diagnosis not present

## 2022-05-29 DIAGNOSIS — Z992 Dependence on renal dialysis: Secondary | ICD-10-CM | POA: Diagnosis not present

## 2022-05-29 DIAGNOSIS — N2581 Secondary hyperparathyroidism of renal origin: Secondary | ICD-10-CM | POA: Diagnosis not present

## 2022-06-01 DIAGNOSIS — N186 End stage renal disease: Secondary | ICD-10-CM | POA: Diagnosis not present

## 2022-06-01 DIAGNOSIS — N2581 Secondary hyperparathyroidism of renal origin: Secondary | ICD-10-CM | POA: Diagnosis not present

## 2022-06-01 DIAGNOSIS — Z992 Dependence on renal dialysis: Secondary | ICD-10-CM | POA: Diagnosis not present

## 2022-06-02 ENCOUNTER — Telehealth (INDEPENDENT_AMBULATORY_CARE_PROVIDER_SITE_OTHER): Payer: Self-pay

## 2022-06-02 ENCOUNTER — Telehealth: Payer: Self-pay | Admitting: Nurse Practitioner

## 2022-06-02 NOTE — Telephone Encounter (Signed)
Returned patients call and informed him that we have no record of who he was to see or when his appt was for his prosthetic. Instructed patient to call his surgeon for that information. Patient verbalized understanding.

## 2022-06-02 NOTE — Telephone Encounter (Signed)
Copied from [place] [id number]. Topic: General - Other >> Jun 02, 2022  1:42 PM Dominique A wrote: Reason for CRM: Pt is calling to see when his appt is for him to get fitted for his prosthetic leg. Pt does not remember the doctors name nor the appointment day and time. Please assist.

## 2022-06-02 NOTE — Telephone Encounter (Signed)
Patient keeps calling about a prosthetic leg, and he has an appt in our office for a HDA and be seen. This information was given to the patient.

## 2022-06-03 DIAGNOSIS — N186 End stage renal disease: Secondary | ICD-10-CM | POA: Diagnosis not present

## 2022-06-03 DIAGNOSIS — Z992 Dependence on renal dialysis: Secondary | ICD-10-CM | POA: Diagnosis not present

## 2022-06-03 DIAGNOSIS — N2581 Secondary hyperparathyroidism of renal origin: Secondary | ICD-10-CM | POA: Diagnosis not present

## 2022-06-04 ENCOUNTER — Telehealth: Payer: Self-pay

## 2022-06-04 NOTE — Telephone Encounter (Signed)
Copied from [place] [id number]. Topic: General - Inquiry >> Jun 04, 2022  2:28 PM Teressa P wrote: Pt called saying the trazodone he is taking is not helping and wants to know id he can take the Ambien.  [place]  CB@  [phone]

## 2022-06-05 DIAGNOSIS — N186 End stage renal disease: Secondary | ICD-10-CM | POA: Diagnosis not present

## 2022-06-05 DIAGNOSIS — N2581 Secondary hyperparathyroidism of renal origin: Secondary | ICD-10-CM | POA: Diagnosis not present

## 2022-06-05 DIAGNOSIS — Z992 Dependence on renal dialysis: Secondary | ICD-10-CM | POA: Diagnosis not present

## 2022-06-05 NOTE — Telephone Encounter (Signed)
Patient made aware of Provider's recommendations and verbalized understanding.   

## 2022-06-08 DIAGNOSIS — N2581 Secondary hyperparathyroidism of renal origin: Secondary | ICD-10-CM | POA: Diagnosis not present

## 2022-06-08 DIAGNOSIS — N186 End stage renal disease: Secondary | ICD-10-CM | POA: Diagnosis not present

## 2022-06-08 DIAGNOSIS — Z992 Dependence on renal dialysis: Secondary | ICD-10-CM | POA: Diagnosis not present

## 2022-06-10 DIAGNOSIS — Z992 Dependence on renal dialysis: Secondary | ICD-10-CM | POA: Diagnosis not present

## 2022-06-10 DIAGNOSIS — N186 End stage renal disease: Secondary | ICD-10-CM | POA: Diagnosis not present

## 2022-06-10 DIAGNOSIS — N2581 Secondary hyperparathyroidism of renal origin: Secondary | ICD-10-CM | POA: Diagnosis not present

## 2022-06-12 DIAGNOSIS — N186 End stage renal disease: Secondary | ICD-10-CM | POA: Diagnosis not present

## 2022-06-12 DIAGNOSIS — N2581 Secondary hyperparathyroidism of renal origin: Secondary | ICD-10-CM | POA: Diagnosis not present

## 2022-06-12 DIAGNOSIS — Z992 Dependence on renal dialysis: Secondary | ICD-10-CM | POA: Diagnosis not present

## 2022-06-14 DIAGNOSIS — I120 Hypertensive chronic kidney disease with stage 5 chronic kidney disease or end stage renal disease: Secondary | ICD-10-CM | POA: Diagnosis not present

## 2022-06-14 DIAGNOSIS — Z992 Dependence on renal dialysis: Secondary | ICD-10-CM | POA: Diagnosis not present

## 2022-06-14 DIAGNOSIS — N186 End stage renal disease: Secondary | ICD-10-CM | POA: Diagnosis not present

## 2022-06-15 DIAGNOSIS — N2581 Secondary hyperparathyroidism of renal origin: Secondary | ICD-10-CM | POA: Diagnosis not present

## 2022-06-15 DIAGNOSIS — Z992 Dependence on renal dialysis: Secondary | ICD-10-CM | POA: Diagnosis not present

## 2022-06-15 DIAGNOSIS — N186 End stage renal disease: Secondary | ICD-10-CM | POA: Diagnosis not present

## 2022-06-17 DIAGNOSIS — N186 End stage renal disease: Secondary | ICD-10-CM | POA: Diagnosis not present

## 2022-06-17 DIAGNOSIS — Z992 Dependence on renal dialysis: Secondary | ICD-10-CM | POA: Diagnosis not present

## 2022-06-17 DIAGNOSIS — N2581 Secondary hyperparathyroidism of renal origin: Secondary | ICD-10-CM | POA: Diagnosis not present

## 2022-06-19 DIAGNOSIS — N2581 Secondary hyperparathyroidism of renal origin: Secondary | ICD-10-CM | POA: Diagnosis not present

## 2022-06-19 DIAGNOSIS — Z992 Dependence on renal dialysis: Secondary | ICD-10-CM | POA: Diagnosis not present

## 2022-06-19 DIAGNOSIS — N186 End stage renal disease: Secondary | ICD-10-CM | POA: Diagnosis not present

## 2022-06-22 ENCOUNTER — Telehealth: Payer: Self-pay | Admitting: Nurse Practitioner

## 2022-06-22 DIAGNOSIS — N186 End stage renal disease: Secondary | ICD-10-CM | POA: Diagnosis not present

## 2022-06-22 DIAGNOSIS — N2581 Secondary hyperparathyroidism of renal origin: Secondary | ICD-10-CM | POA: Diagnosis not present

## 2022-06-22 DIAGNOSIS — Z992 Dependence on renal dialysis: Secondary | ICD-10-CM | POA: Diagnosis not present

## 2022-06-22 MED ORDER — HYDROCODONE-ACETAMINOPHEN 10-325 MG PO TABS: 1.0000 | ORAL_TABLET | Freq: Three times a day (TID) | ORAL | 0 refills | Status: DC | PRN

## 2022-06-22 NOTE — Telephone Encounter (Signed)
Medication Refill - Medication: HYDROcodone-acetaminophen (NORCO) 10-325 MG per tablet 1 tablet   [[id number]]   Has the patient contacted their pharmacy? No.   Preferred Pharmacy (with phone number or street name):  [facility] [address] [place]), [place] - [address] Phone: [phone]  Fax: [phone]     Has the patient been seen for an appointment in the last year OR does the patient have an upcoming appointment? Yes.    The patient called in stating he last received this medication last month and states he stays in pain due to losing his leg. The prescription on file states it has expired. Please assist patient further

## 2022-06-23 NOTE — Telephone Encounter (Signed)
Patient made aware of Provider's recommendations and verbalized understanding.   

## 2022-06-24 DIAGNOSIS — N2581 Secondary hyperparathyroidism of renal origin: Secondary | ICD-10-CM | POA: Diagnosis not present

## 2022-06-24 DIAGNOSIS — Z992 Dependence on renal dialysis: Secondary | ICD-10-CM | POA: Diagnosis not present

## 2022-06-24 DIAGNOSIS — N186 End stage renal disease: Secondary | ICD-10-CM | POA: Diagnosis not present

## 2022-06-25 ENCOUNTER — Encounter (INDEPENDENT_AMBULATORY_CARE_PROVIDER_SITE_OTHER): Payer: Medicare Other

## 2022-06-25 ENCOUNTER — Ambulatory Visit (INDEPENDENT_AMBULATORY_CARE_PROVIDER_SITE_OTHER): Payer: Medicare Other | Admitting: Nurse Practitioner

## 2022-06-26 DIAGNOSIS — N2581 Secondary hyperparathyroidism of renal origin: Secondary | ICD-10-CM | POA: Diagnosis not present

## 2022-06-26 DIAGNOSIS — N186 End stage renal disease: Secondary | ICD-10-CM | POA: Diagnosis not present

## 2022-06-26 DIAGNOSIS — Z992 Dependence on renal dialysis: Secondary | ICD-10-CM | POA: Diagnosis not present

## 2022-06-29 DIAGNOSIS — N186 End stage renal disease: Secondary | ICD-10-CM | POA: Diagnosis not present

## 2022-06-29 DIAGNOSIS — N2581 Secondary hyperparathyroidism of renal origin: Secondary | ICD-10-CM | POA: Diagnosis not present

## 2022-06-29 DIAGNOSIS — Z992 Dependence on renal dialysis: Secondary | ICD-10-CM | POA: Diagnosis not present

## 2022-07-01 ENCOUNTER — Ambulatory Visit: Payer: Medicare HMO | Admitting: Internal Medicine

## 2022-07-01 DIAGNOSIS — Z992 Dependence on renal dialysis: Secondary | ICD-10-CM | POA: Diagnosis not present

## 2022-07-01 DIAGNOSIS — N2581 Secondary hyperparathyroidism of renal origin: Secondary | ICD-10-CM | POA: Diagnosis not present

## 2022-07-01 DIAGNOSIS — N186 End stage renal disease: Secondary | ICD-10-CM | POA: Diagnosis not present

## 2022-07-03 DIAGNOSIS — N2581 Secondary hyperparathyroidism of renal origin: Secondary | ICD-10-CM | POA: Diagnosis not present

## 2022-07-03 DIAGNOSIS — Z992 Dependence on renal dialysis: Secondary | ICD-10-CM | POA: Diagnosis not present

## 2022-07-03 DIAGNOSIS — N186 End stage renal disease: Secondary | ICD-10-CM | POA: Diagnosis not present

## 2022-07-06 DIAGNOSIS — N186 End stage renal disease: Secondary | ICD-10-CM | POA: Diagnosis not present

## 2022-07-06 DIAGNOSIS — Z992 Dependence on renal dialysis: Secondary | ICD-10-CM | POA: Diagnosis not present

## 2022-07-06 DIAGNOSIS — N2581 Secondary hyperparathyroidism of renal origin: Secondary | ICD-10-CM | POA: Diagnosis not present

## 2022-07-08 DIAGNOSIS — N186 End stage renal disease: Secondary | ICD-10-CM | POA: Diagnosis not present

## 2022-07-08 DIAGNOSIS — Z992 Dependence on renal dialysis: Secondary | ICD-10-CM | POA: Diagnosis not present

## 2022-07-08 DIAGNOSIS — N2581 Secondary hyperparathyroidism of renal origin: Secondary | ICD-10-CM | POA: Diagnosis not present

## 2022-07-10 DIAGNOSIS — N186 End stage renal disease: Secondary | ICD-10-CM | POA: Diagnosis not present

## 2022-07-10 DIAGNOSIS — Z992 Dependence on renal dialysis: Secondary | ICD-10-CM | POA: Diagnosis not present

## 2022-07-10 DIAGNOSIS — N2581 Secondary hyperparathyroidism of renal origin: Secondary | ICD-10-CM | POA: Diagnosis not present

## 2022-07-13 DIAGNOSIS — N2581 Secondary hyperparathyroidism of renal origin: Secondary | ICD-10-CM | POA: Diagnosis not present

## 2022-07-13 DIAGNOSIS — N186 End stage renal disease: Secondary | ICD-10-CM | POA: Diagnosis not present

## 2022-07-13 DIAGNOSIS — Z992 Dependence on renal dialysis: Secondary | ICD-10-CM | POA: Diagnosis not present

## 2022-07-14 DIAGNOSIS — Z992 Dependence on renal dialysis: Secondary | ICD-10-CM | POA: Diagnosis not present

## 2022-07-14 DIAGNOSIS — I120 Hypertensive chronic kidney disease with stage 5 chronic kidney disease or end stage renal disease: Secondary | ICD-10-CM | POA: Diagnosis not present

## 2022-07-14 DIAGNOSIS — N186 End stage renal disease: Secondary | ICD-10-CM | POA: Diagnosis not present

## 2022-07-15 DIAGNOSIS — N186 End stage renal disease: Secondary | ICD-10-CM | POA: Diagnosis not present

## 2022-07-15 DIAGNOSIS — Z992 Dependence on renal dialysis: Secondary | ICD-10-CM | POA: Diagnosis not present

## 2022-07-15 DIAGNOSIS — N2581 Secondary hyperparathyroidism of renal origin: Secondary | ICD-10-CM | POA: Diagnosis not present

## 2022-07-17 DIAGNOSIS — N186 End stage renal disease: Secondary | ICD-10-CM | POA: Diagnosis not present

## 2022-07-17 DIAGNOSIS — Z992 Dependence on renal dialysis: Secondary | ICD-10-CM | POA: Diagnosis not present

## 2022-07-17 DIAGNOSIS — N2581 Secondary hyperparathyroidism of renal origin: Secondary | ICD-10-CM | POA: Diagnosis not present

## 2022-07-20 DIAGNOSIS — N2581 Secondary hyperparathyroidism of renal origin: Secondary | ICD-10-CM | POA: Diagnosis not present

## 2022-07-20 DIAGNOSIS — Z992 Dependence on renal dialysis: Secondary | ICD-10-CM | POA: Diagnosis not present

## 2022-07-20 DIAGNOSIS — N186 End stage renal disease: Secondary | ICD-10-CM | POA: Diagnosis not present

## 2022-07-21 ENCOUNTER — Encounter (INDEPENDENT_AMBULATORY_CARE_PROVIDER_SITE_OTHER): Payer: Self-pay | Admitting: Nurse Practitioner

## 2022-07-21 ENCOUNTER — Ambulatory Visit (INDEPENDENT_AMBULATORY_CARE_PROVIDER_SITE_OTHER): Payer: Medicare HMO | Admitting: Nurse Practitioner

## 2022-07-21 ENCOUNTER — Ambulatory Visit (INDEPENDENT_AMBULATORY_CARE_PROVIDER_SITE_OTHER): Payer: Medicare HMO

## 2022-07-21 VITALS — BP 135/88 | HR 82 | Resp 16 | Ht 75.0 in

## 2022-07-21 DIAGNOSIS — N186 End stage renal disease: Secondary | ICD-10-CM

## 2022-07-21 DIAGNOSIS — E1122 Type 2 diabetes mellitus with diabetic chronic kidney disease: Secondary | ICD-10-CM

## 2022-07-21 DIAGNOSIS — T829XXS Unspecified complication of cardiac and vascular prosthetic device, implant and graft, sequela: Secondary | ICD-10-CM | POA: Diagnosis not present

## 2022-07-21 DIAGNOSIS — Z89611 Acquired absence of right leg above knee: Secondary | ICD-10-CM | POA: Diagnosis not present

## 2022-07-21 DIAGNOSIS — E1169 Type 2 diabetes mellitus with other specified complication: Secondary | ICD-10-CM

## 2022-07-21 DIAGNOSIS — E785 Hyperlipidemia, unspecified: Secondary | ICD-10-CM

## 2022-07-21 NOTE — Progress Notes (Signed)
Subjective:    [name], male    DOB: [birth year], 61 y.o.   MRN: [id number] Chief Complaint  Patient presents with   Establish Care    Adam Howell is a 62 year old male who presents today for follow-up evaluation of a right above-knee amputation done on 03/13/2022.  The wound is largely healed just a small scabbed area.  He is currently very motivated to move forward with a prosthetic.  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  He notes that he has had it for several years.  He notes that he has had this area of scabbing on his fistula for several months.  He notes that there is no significant bleeding in there.  Today he has a flow volume of 922.  The aneurysmal segment in the distal portion measures 0.8 cm x 1.02, the mid measures 1.4 x 1.98 cm at the proximal measures 1.60 x 2.05 cm    Review of Systems  Musculoskeletal:  Positive for gait problem.  Skin:  Positive for wound.  All other systems reviewed and are negative.      Objective:   Physical Exam Vitals reviewed.  HENT:     Head: Normocephalic.  Cardiovascular:     Rate and Rhythm: Normal rate.     Pulses:          Radial pulses are 2+ on the left side.     Arteriovenous access: Left arteriovenous access is present.    Comments: Some evidence of skin threatening and aneurysmal fistula Pulmonary:     Effort: Pulmonary effort is normal.  Musculoskeletal:     Right Lower Extremity: Right leg is amputated above knee.  Skin:    General: Skin is warm and dry.  Neurological:     Mental Status: [name] and oriented to person, place, and time.  Psychiatric:        Mood and Affect: Mood normal.        Behavior: Behavior normal.        Thought Content: Thought content normal.        Judgment: Judgment normal.     BP 135/88 (BP Location: Right Arm)   Pulse 82   Resp 16   Ht 6\' 3"   (1.905 m)   BMI 29.00 kg/m   Past Medical History:  Diagnosis Date   Chronic kidney disease    Congestive heart failure (HCC)    Diabetes mellitus without complication (HCC)    Difficult intubation    Hyperlipidemia    Hypertension     Social History   Socioeconomic History   Marital status: Married    Spouse name: Mays Busscher   Number of children: 4   Years of education: Not on file   Highest education level: Not on file  Occupational History   Not on file  Tobacco Use   Smoking status: Never   Smokeless tobacco: Never  Vaping Use   Vaping Use: Never used  Substance and Sexual Activity   Alcohol use: Not Currently   Drug use: Never   Sexual activity: Not Currently  Other Topics Concern   Not on file  Social History Narrative   Not on file   Social Determinants of Health   Financial Resource Strain: Low Risk  (11/25/2021)   Overall Financial Resource Strain (CARDIA)    Difficulty of Paying Living Expenses: Not  hard at all  Food Insecurity: Food Insecurity Present (03/06/2022)   Hunger Vital Sign    Worried About Running Out of Food in the Last Year: Never true    Ran Out of Food in the Last Year: Sometimes true  Transportation Needs: No Transportation Needs (03/06/2022)   PRAPARE - Administrator, Civil Service (Medical): No    Lack of Transportation (Non-Medical): No  Physical Activity: Insufficiently Active (11/25/2021)   Exercise Vital Sign    Days of Exercise per Week: 2 days    Minutes of Exercise per Session: 60 min  Stress: No Stress Concern Present (11/25/2021)   [employer] of Occupational Health - Occupational Stress Questionnaire    Feeling of Stress : Not at all  Social Connections: Moderately Isolated (11/25/2021)   Social Connection and Isolation Panel [NHANES]    Frequency of Communication with Friends and Family: More than three times a week    Frequency of Social Gatherings with Friends and Family: Three times a week     Attends Religious Services: More than 4 times per year    Active Member of Clubs or Organizations: No    Attends Banker Meetings: Never    Marital Status: Never married  Intimate Partner Violence: Not At Risk (03/06/2022)   Humiliation, Afraid, Rape, and Kick questionnaire    Fear of Current or Ex-Partner: No    Emotionally Abused: No    Physically Abused: No    Sexually Abused: No    Past Surgical History:  Procedure Laterality Date   AMPUTATION Right 03/13/2022   Procedure: AMPUTATION ABOVE KNEE;  Surgeon: Annice Needy, MD;  Location: [facility];  Service: General;  Laterality: Right;   APPLICATION OF WOUND VAC Right 03/07/2022   Procedure: APPLICATION OF WOUND VAC;  Surgeon: Bertram Denver, MD;  Location: [facility];  Service: Vascular;  Laterality: Right;  GAAC [id number]   WOUND DEBRIDEMENT Left    WOUND DEBRIDEMENT Right 01/16/2022   Procedure: DEBRIDEMENT WOUND;  Surgeon: Renford Dills, MD;  Location: [facility];  Service: Vascular;  Laterality: Right;  Wound vac placement   WOUND DEBRIDEMENT Right 03/07/2022   Procedure: DEBRIDEMENT WOUND RIGHT LOWER EXTREMITY;  Surgeon: Bertram Denver, MD;  Location: [facility];  Service: Vascular;  Laterality: Right;    Family History  Problem Relation Age of Onset   Heart failure Mother    Cirrhosis Father    Hypertension Daughter     Allergies  Allergen Reactions   Tramadol Rash       Latest Ref Rng & Units 05/12/2022    2:05 PM 04/17/2022    7:27 AM 04/03/2022   11:41 PM  CBC  WBC 3.4 - 10.8 x10E3/uL 5.3  5.6  4.5   Hemoglobin 13.0 - 17.7 g/dL 60.4  9.9  9.0   Hematocrit 37.5 - 51.0 % 37.1  32.1  27.5   Platelets 150 - 450 x10E3/uL 175  192  182       CMP     Component Value Date/Time   NA 138 05/12/2022 1405   K 5.6 (H) 05/12/2022 1405   CL 92 (L) 05/12/2022 1405   CO2 24 05/12/2022 1405   GLUCOSE 106 (H) 05/12/2022 1405   GLUCOSE 92 04/17/2022 0727   BUN 53 (H) 05/12/2022 1405   CREATININE 9.54 (H)  05/12/2022 1405   CALCIUM 8.0 (L) 05/12/2022 1405   PROT 7.8 05/12/2022 1405   ALBUMIN 4.3 05/12/2022 1405   AST  30 05/12/2022 1405   ALT 23 05/12/2022 1405   ALKPHOS 94 05/12/2022 1405   BILITOT 0.5 05/12/2022 1405   GFRNONAA 8 (L) 04/17/2022 0727     No results found.     Assessment & Plan:   1. ESRD (end stage renal disease) (HCC) The patient does have some early evidence of skin threatening.  He has also had some scabbing has been there for over a month which is somewhat concerning this could become an ulceration.  Will have the patient undergo a fistulogram in order to try to evaluate for possible revision of the access versus planning for new access.  2. Hyperlipidemia associated with type 2 diabetes mellitus (HCC) Continue statin as ordered and reviewed, no changes at this time  3. Type 2 diabetes mellitus with ESRD (end-stage renal disease) (HCC) Continue hypoglycemic medications as already ordered, these medications have been reviewed and there are no changes at this time.  Hgb A1C to be monitored as already arranged by primary service  4. S/P AKA (above knee amputation), right [facility]) Mr. Tiedt was seen for further evaluation for right above knee prosthesis.He Is a 62 year old male who had a right transfemoral amputation on 03/13/2022.  At the time he is well healed and ready for fitting of new above knee prosthesis.  He is a highly motivated individual and should do well once fitted with prosthesis.  He has no problem returning to a K2 ambulator, which will allow him to walk inside and outside of his home and overload level barriers.   Current Outpatient Medications on File Prior to Visit  Medication Sig Dispense Refill   amiodarone (PACERONE) 200 MG tablet Take 1 tablet (200 mg total) by mouth 2 (two) times daily. 60 tablet 0   bumetanide (BUMEX) 1 MG tablet Take 1 tablet (1 mg total) by mouth 2 (two) times daily. 90 tablet 4   calcitRIOL (ROCALTROL) 0.5 MCG capsule Take  3 capsules (1.5 mcg total) by mouth every Monday, Wednesday, and Friday at 6 PM.     cinacalcet (SENSIPAR) 30 MG tablet Take 1 tablet (30 mg total) by mouth daily with breakfast. 30 tablet 0   ELIQUIS 2.5 MG TABS tablet Take 1 tablet (2.5 mg total) by mouth 2 (two) times daily. 180 tablet 0   hydrocerin (EUCERIN) CREA Apply 1 Application topically 2 (two) times daily. To foot 454 g 0   HYDROcodone-acetaminophen (NORCO) 10-325 MG tablet Take 1 tablet by mouth every 8 (eight) hours as needed. 90 tablet 0   melatonin 5 MG TABS Take 2 tablets (10 mg total) by mouth at bedtime as needed. 60 tablet 0   multivitamin (RENA-VIT) TABS tablet Take 1 tablet by mouth at bedtime. 30 tablet 0   rosuvastatin (CRESTOR) 10 MG tablet Take 1 tablet (10 mg total) by mouth daily. 90 tablet 1   senna (SENOKOT) 8.6 MG TABS tablet Take 2 tablets (17.2 mg total) by mouth at bedtime. 60 tablet 0   No current facility-administered medications on file prior to visit.    There are no Patient Instructions on file for this visit. No follow-ups on file.   Georgiana Spinner, NP

## 2022-07-22 ENCOUNTER — Other Ambulatory Visit: Payer: Self-pay | Admitting: Nurse Practitioner

## 2022-07-22 ENCOUNTER — Telehealth: Payer: Self-pay | Admitting: Nurse Practitioner

## 2022-07-22 DIAGNOSIS — N186 End stage renal disease: Secondary | ICD-10-CM | POA: Diagnosis not present

## 2022-07-22 DIAGNOSIS — N2581 Secondary hyperparathyroidism of renal origin: Secondary | ICD-10-CM | POA: Diagnosis not present

## 2022-07-22 DIAGNOSIS — Z992 Dependence on renal dialysis: Secondary | ICD-10-CM | POA: Diagnosis not present

## 2022-07-22 MED ORDER — HYDROCODONE-ACETAMINOPHEN 10-325 MG PO TABS: 1.0000 | ORAL_TABLET | Freq: Three times a day (TID) | ORAL | 0 refills | Status: AC | PRN

## 2022-07-22 NOTE — Telephone Encounter (Unsigned)
Medication Refill - Medication: HYDROcodone-acetaminophen (NORCO) 10-325 MG per tablet 1 tablet   Has the patient contacted their pharmacy? No. (Agent: If no, request that the patient contact the pharmacy for the refill. If patient does not wish to contact the pharmacy document the reason why and proceed with request.) (Agent: If yes, when and what did the pharmacy advise?)  Preferred Pharmacy (with phone number or street name): [id number] - [place] ([place]), [place] - [address]  Has the patient been seen for an appointment in the last year OR does the patient have an upcoming appointment? {yes [id number]}  Agent: Please be advised that RX refills may take up to 3 business days. We ask that you follow-up with your pharmacy.

## 2022-07-22 NOTE — Telephone Encounter (Signed)
Requested medication (s) are due for refill today: yes  Requested medication (s) are on the active medication list: yes  Last refill:  06/22/22  Future visit scheduled: yes  Notes to clinic:  Unable to refill per protocol, cannot delegate.      Requested Prescriptions  Pending Prescriptions Disp Refills   HYDROcodone-acetaminophen (NORCO) 10-325 MG tablet 90 tablet 0    Sig: Take 1 tablet by mouth every 8 (eight) hours as needed.     Not Delegated - Analgesics:  Opioid Agonist Combinations Failed - 07/22/2022  1:40 PM      Failed - This refill cannot be delegated      Failed - Urine Drug Screen completed in last 360 days      Passed - Valid encounter within last 3 months    Recent Outpatient Visits           2 months ago Type 2 diabetes mellitus with ESRD (end-stage renal disease) (HCC)   [place] [facility] [place], [place] T, NP   3 months ago S/P AKA (above knee amputation), right (HCC)   [place] [facility] [place], Megan P, DO   5 months ago Chronic pain syndrome   [place] [facility] [place], [place] T, NP   5 months ago Diabetic ulcer of right lower leg (HCC)   [place] [facility] [place], Corrie Dandy T, NP   6 months ago Ulcer of right lower extremity, unspecified ulcer stage (HCC)   [place] [facility] [place], Oswaldo [place], PA-C       Future Appointments             In 2 weeks Cannady, Dorie Rank, NP [place] [facility]   In 1 month End, Cristal Deer, MD [facility]

## 2022-07-22 NOTE — Telephone Encounter (Signed)
Medication Refill - Medication: HYDROcodone-acetaminophen (NORCO) 10-325 MG tablet   Has the patient contacted their pharmacy? No.   Preferred Pharmacy (with phone number or street name):  [facility] [address] [place]), [place] - [address] Phone: [phone]  Fax: [phone]     Has the patient been seen for an appointment in the last year OR does the patient have an upcoming appointment? Yes.    Agent: Please be advised that RX refills may take up to 3 business days. We ask that you follow-up with your pharmacy.

## 2022-07-23 NOTE — Telephone Encounter (Signed)
Refill of this medication has been sent in

## 2022-07-24 DIAGNOSIS — N2581 Secondary hyperparathyroidism of renal origin: Secondary | ICD-10-CM | POA: Diagnosis not present

## 2022-07-24 DIAGNOSIS — N186 End stage renal disease: Secondary | ICD-10-CM | POA: Diagnosis not present

## 2022-07-24 DIAGNOSIS — Z992 Dependence on renal dialysis: Secondary | ICD-10-CM | POA: Diagnosis not present

## 2022-07-27 DIAGNOSIS — N2581 Secondary hyperparathyroidism of renal origin: Secondary | ICD-10-CM | POA: Diagnosis not present

## 2022-07-27 DIAGNOSIS — Z992 Dependence on renal dialysis: Secondary | ICD-10-CM | POA: Diagnosis not present

## 2022-07-27 DIAGNOSIS — N186 End stage renal disease: Secondary | ICD-10-CM | POA: Diagnosis not present

## 2022-07-29 DIAGNOSIS — N2581 Secondary hyperparathyroidism of renal origin: Secondary | ICD-10-CM | POA: Diagnosis not present

## 2022-07-29 DIAGNOSIS — Z992 Dependence on renal dialysis: Secondary | ICD-10-CM | POA: Diagnosis not present

## 2022-07-29 DIAGNOSIS — N186 End stage renal disease: Secondary | ICD-10-CM | POA: Diagnosis not present

## 2022-07-31 DIAGNOSIS — Z992 Dependence on renal dialysis: Secondary | ICD-10-CM | POA: Diagnosis not present

## 2022-07-31 DIAGNOSIS — N2581 Secondary hyperparathyroidism of renal origin: Secondary | ICD-10-CM | POA: Diagnosis not present

## 2022-07-31 DIAGNOSIS — N186 End stage renal disease: Secondary | ICD-10-CM | POA: Diagnosis not present

## 2022-08-03 DIAGNOSIS — Z992 Dependence on renal dialysis: Secondary | ICD-10-CM | POA: Diagnosis not present

## 2022-08-03 DIAGNOSIS — N2581 Secondary hyperparathyroidism of renal origin: Secondary | ICD-10-CM | POA: Diagnosis not present

## 2022-08-03 DIAGNOSIS — N186 End stage renal disease: Secondary | ICD-10-CM | POA: Diagnosis not present

## 2022-08-05 DIAGNOSIS — Z992 Dependence on renal dialysis: Secondary | ICD-10-CM | POA: Diagnosis not present

## 2022-08-05 DIAGNOSIS — N2581 Secondary hyperparathyroidism of renal origin: Secondary | ICD-10-CM | POA: Diagnosis not present

## 2022-08-05 DIAGNOSIS — N186 End stage renal disease: Secondary | ICD-10-CM | POA: Diagnosis not present

## 2022-08-06 ENCOUNTER — Telehealth (INDEPENDENT_AMBULATORY_CARE_PROVIDER_SITE_OTHER): Payer: Self-pay

## 2022-08-06 NOTE — Telephone Encounter (Signed)
I attempted to contact the patient to schedule a left arm fistulagram with Dr. Dew. A message was left for a return call. 

## 2022-08-07 DIAGNOSIS — Z992 Dependence on renal dialysis: Secondary | ICD-10-CM | POA: Diagnosis not present

## 2022-08-07 DIAGNOSIS — N2581 Secondary hyperparathyroidism of renal origin: Secondary | ICD-10-CM | POA: Diagnosis not present

## 2022-08-07 DIAGNOSIS — N186 End stage renal disease: Secondary | ICD-10-CM | POA: Diagnosis not present

## 2022-08-09 NOTE — Patient Instructions (Signed)
Diabetes Mellitus Basics  Diabetes mellitus, or diabetes, is a long-term (chronic) disease. It occurs when the body does not properly use sugar (glucose) that is released from food after you eat. Diabetes mellitus may be caused by one or both of these problems: Your pancreas does not make enough of a hormone called insulin. Your body does not react in a normal way to the insulin that it makes. Insulin lets glucose enter cells in your body. This gives you energy. If you have diabetes, glucose cannot get into cells. This causes high blood glucose (hyperglycemia). How to treat and manage diabetes You may need to take insulin or other diabetes medicines daily to keep your glucose in balance. If you are prescribed insulin, you will learn how to give yourself insulin by injection. You may need to adjust the amount of insulin you take based on the foods that you eat. You will need to check your blood glucose levels using a glucose monitor as told by your health care provider. The readings can help determine if you have low or high blood glucose. Generally, you should have these blood glucose levels: Before meals (preprandial): 80-130 mg/dL (4.4-7.2 mmol/L). After meals (postprandial): below 180 mg/dL (10 mmol/L). Hemoglobin A1c (HbA1c) level: less than 7%. Your health care provider will set treatment goals for you. Keep all follow-up visits. This is important. Follow these instructions at home: Diabetes medicines Take your diabetes medicines every day as told by your health care provider. List your diabetes medicines here: Name of medicine: ______________________________ Amount (dose): _______________ Time (a.m./p.m.): _______________ Notes: ___________________________________ Name of medicine: ______________________________ Amount (dose): _______________ Time (a.m./p.m.): _______________ Notes: ___________________________________ Name of medicine: ______________________________ Amount (dose):  _______________ Time (a.m./p.m.): _______________ Notes: ___________________________________ Insulin If you use insulin, list the types of insulin you use here: Insulin type: ______________________________ Amount (dose): _______________ Time (a.m./p.m.): _______________Notes: ___________________________________ Insulin type: ______________________________ Amount (dose): _______________ Time (a.m./p.m.): _______________ Notes: ___________________________________ Insulin type: ______________________________ Amount (dose): _______________ Time (a.m./p.m.): _______________ Notes: ___________________________________ Insulin type: ______________________________ Amount (dose): _______________ Time (a.m./p.m.): _______________ Notes: ___________________________________ Insulin type: ______________________________ Amount (dose): _______________ Time (a.m./p.m.): _______________ Notes: ___________________________________ Managing blood glucose  Check your blood glucose levels using a glucose monitor as told by your health care provider. Write down the times that you check your glucose levels here: Time: _______________ Notes: ___________________________________ Time: _______________ Notes: ___________________________________ Time: _______________ Notes: ___________________________________ Time: _______________ Notes: ___________________________________ Time: _______________ Notes: ___________________________________ Time: _______________ Notes: ___________________________________  Low blood glucose Low blood glucose (hypoglycemia) is when glucose is at or below 70 mg/dL (3.9 mmol/L). Symptoms may include: Feeling: Hungry. Sweaty and clammy. Irritable or easily upset. Dizzy. Sleepy. Having: A fast heartbeat. A headache. A change in your vision. Numbness around the mouth, lips, or tongue. Having trouble with: Moving (coordination). Sleeping. Treating low blood glucose To treat low blood  glucose, eat or drink something containing sugar right away. If you can think clearly and swallow safely, follow the 15:15 rule: Take 15 grams of a fast-acting carb (carbohydrate), as told by your health care provider. Some fast-acting carbs are: Glucose tablets: take 3-4 tablets. Hard candy: eat 3-5 pieces. Fruit juice: drink 4 oz (120 mL). Regular (not diet) soda: drink 4-6 oz (120-180 mL). Honey or sugar: eat 1 Tbsp (15 mL). Check your blood glucose levels 15 minutes after you take the carb. If your glucose is still at or below 70 mg/dL (3.9 mmol/L), take 15 grams of a carb again. If your glucose does not go above 70 mg/dL (3.9 mmol/L) after   3 tries, get help right away. After your glucose goes back to normal, eat a meal or a snack within 1 hour. Treating very low blood glucose If your glucose is at or below 54 mg/dL (3 mmol/L), you have very low blood glucose (severe hypoglycemia). This is an emergency. Do not wait to see if the symptoms will go away. Get medical help right away. Call your local emergency services (911 in the [place] Do not drive yourself to the hospital. Questions to ask your health care provider Should I talk with a diabetes educator? What equipment will I need to care for myself at home? What diabetes medicines do I need? When should I take them? How often do I need to check my blood glucose levels? What number can I call if I have questions? When is my follow-up visit? Where can I find a support group for people with diabetes? Where to find more information American Diabetes Association: www.diabetes.org Association of Diabetes Care and Education Specialists: www.diabeteseducator.org Contact a health care provider if: Your blood glucose is at or above 240 mg/dL (13.3 mmol/L) for 2 days in a row. You have been sick or have had a fever for 2 days or more, and you are not getting better. You have any of these problems for more than 6 hours: You cannot eat or  drink. You feel nauseous. You vomit. You have diarrhea. Get help right away if: Your blood glucose is lower than 54 mg/dL (3 mmol/L). You get confused. You have trouble thinking clearly. You have trouble breathing. These symptoms may represent a serious problem that is an emergency. Do not wait to see if the symptoms will go away. Get medical help right away. Call your local emergency services (911 in the [place] Do not drive yourself to the hospital. Summary Diabetes mellitus is a chronic disease that occurs when the body does not properly use sugar (glucose) that is released from food after you eat. Take insulin and diabetes medicines as told. Check your blood glucose every day, as often as told. Keep all follow-up visits. This is important. This information is not intended to replace advice given to you by your health care provider. Make sure you discuss any questions you have with your health care provider. Document Revised: 07/04/2019 Document Reviewed: 07/04/2019 Elsevier Patient Education  2024 [employer]  

## 2022-08-11 ENCOUNTER — Ambulatory Visit (INDEPENDENT_AMBULATORY_CARE_PROVIDER_SITE_OTHER): Payer: Medicare HMO | Admitting: Nurse Practitioner

## 2022-08-11 ENCOUNTER — Encounter: Payer: Self-pay | Admitting: Nurse Practitioner

## 2022-08-11 VITALS — BP 138/88 | HR 65 | Temp 98.2°F | Ht 75.0 in

## 2022-08-11 DIAGNOSIS — N2581 Secondary hyperparathyroidism of renal origin: Secondary | ICD-10-CM

## 2022-08-11 DIAGNOSIS — E1122 Type 2 diabetes mellitus with diabetic chronic kidney disease: Secondary | ICD-10-CM

## 2022-08-11 DIAGNOSIS — N185 Chronic kidney disease, stage 5: Secondary | ICD-10-CM

## 2022-08-11 DIAGNOSIS — I132 Hypertensive heart and chronic kidney disease with heart failure and with stage 5 chronic kidney disease, or end stage renal disease: Secondary | ICD-10-CM

## 2022-08-11 DIAGNOSIS — D6869 Other thrombophilia: Secondary | ICD-10-CM

## 2022-08-11 DIAGNOSIS — Z992 Dependence on renal dialysis: Secondary | ICD-10-CM

## 2022-08-11 DIAGNOSIS — N186 End stage renal disease: Secondary | ICD-10-CM | POA: Diagnosis not present

## 2022-08-11 DIAGNOSIS — F5101 Primary insomnia: Secondary | ICD-10-CM

## 2022-08-11 DIAGNOSIS — E1169 Type 2 diabetes mellitus with other specified complication: Secondary | ICD-10-CM | POA: Diagnosis not present

## 2022-08-11 DIAGNOSIS — I2510 Atherosclerotic heart disease of native coronary artery without angina pectoris: Secondary | ICD-10-CM

## 2022-08-11 DIAGNOSIS — G894 Chronic pain syndrome: Secondary | ICD-10-CM

## 2022-08-11 DIAGNOSIS — D631 Anemia in chronic kidney disease: Secondary | ICD-10-CM

## 2022-08-11 DIAGNOSIS — Z79899 Other long term (current) drug therapy: Secondary | ICD-10-CM

## 2022-08-11 DIAGNOSIS — I482 Chronic atrial fibrillation, unspecified: Secondary | ICD-10-CM | POA: Diagnosis not present

## 2022-08-11 DIAGNOSIS — I5020 Unspecified systolic (congestive) heart failure: Secondary | ICD-10-CM

## 2022-08-11 DIAGNOSIS — E785 Hyperlipidemia, unspecified: Secondary | ICD-10-CM | POA: Diagnosis not present

## 2022-08-11 DIAGNOSIS — Z89611 Acquired absence of right leg above knee: Secondary | ICD-10-CM | POA: Diagnosis not present

## 2022-08-11 DIAGNOSIS — E669 Obesity, unspecified: Secondary | ICD-10-CM

## 2022-08-11 LAB — BAYER DCA HB A1C WAIVED: HB A1C (BAYER DCA - WAIVED): 5.9 % — ABNORMAL HIGH (ref 4.8–5.6)

## 2022-08-11 MED ORDER — TRAZODONE HCL 50 MG PO TABS: 50.0000 mg | ORAL_TABLET | Freq: Every day | ORAL | 4 refills | Status: DC

## 2022-08-11 NOTE — Assessment & Plan Note (Signed)
Chronic, stable, euvolemic today.  Continue current medication regimen and adjust as needed.  Continue collaboration with cardiology. Have highly recommend he schedule follow-up with cardiology, last visit August 2021 -- new referral placed last visit, will check on this. - Reminded to call for an overnight weight gain of >2 pounds or a weekly weight weight of >5 pounds - not adding salt to his food and has been reading food labels. Reviewed the importance of keeping daily sodium intake to 2000mg  daily  - Avoid NSAIDS

## 2022-08-11 NOTE — Assessment & Plan Note (Signed)
Chronic, ongoing with A1c 5.9% today, remaining stable.  Unable to obtain urine for ALB check.  At goal.  Continue off medications at this time.  Recommend he monitor BS at least a couple times a day and document for provider + focus on diabetic diet.  Return to office in 3 months for follow-up.   - Needs to schedule eye exam, recommend he do so. - Foot exam today - Vaccination, refuses some. - Statin on board.  ESRD, no ACE or ARB at this time.

## 2022-08-11 NOTE — Assessment & Plan Note (Signed)
Chronic, ongoing issue with no recent CP or NTG use.  Have highly recommend he schedule follow-up with cardiology, has not returned to visit since August 2021 -- new referral placed to return last visit, will check on this.

## 2022-08-11 NOTE — Assessment & Plan Note (Signed)
BMI 29.00, some loss since surgery, monitor.  Recommended eating smaller high protein, low fat meals more frequently and exercising 30 mins a day 5 times a week with a goal of 10-15lb weight loss in the next 3 months. Patient voiced their understanding and motivation to adhere to these recommendations.  

## 2022-08-11 NOTE — Assessment & Plan Note (Signed)
Chronic, ongoing, followed by dialysis teams.  Continue current medication regimen as prescribed by them.  Attempt to obtain recent notes from [facility].  Return in 3 months. 

## 2022-08-11 NOTE — Assessment & Plan Note (Signed)
Chronic, ongoing.  Continue current medication regimen and adjust as needed. Lipid panel today. 

## 2022-08-11 NOTE — Assessment & Plan Note (Signed)
Chronic, ongoing with ESRD.  Continue collaboration with dialysis team and nephrology, attempt to obtain recent notes from [facility]. 

## 2022-08-11 NOTE — Assessment & Plan Note (Signed)
Ongoing, reports taking Norco only after dialysis and due to recent AKA right side.  Controlled substance contract up to date, unable to void due to dialysis, will consider pain management referral if frequent use of Norco.  Obtain UDS via blood check today, since ESRD and can not void.  Will refill pain medication during this time for 10-325 Norco due to pain with dressing changes and wound.  He is not to take more then ordered and bring pill bottle to next visit.

## 2022-08-11 NOTE — Assessment & Plan Note (Signed)
Chronic, ongoing, followed by dialysis team.  BP at goal for ESRD in office today.  Continue current medication regimen as prescribed by them.  Attempt to obtain recent notes from [facility].  Labs today: CBC, CMP and Lipid.  Recommend he monitor BP at home a few days a week and document + focus on DASH diet.  Recent nephrology notes reviewed.  Return to office in 3 months.

## 2022-08-11 NOTE — Progress Notes (Addendum)
BP 138/88 (BP Location: Right Arm, Patient Position: Sitting, Cuff Size: Normal)   Pulse 65   Temp 98.2 F (36.8 C)   Ht 6\' 3"  (1.905 m)   BMI 29.00 kg/m    Subjective:    [name], male    DOB: Dec 01, 1960, 62 y.o.   MRN: [id number]  HPI: Adam Howell is a 62 y.o. male  Chief Complaint  Patient presents with   Diabetes   Hypertension   Hyperlipidemia   Insomnia   DIABETES A1c in January 4.7%.  Range since August 2020 -- 4.7 to 6.8%.  Currently not taking any diabetes medicine, previously took Trulicity and insulin.    Had above knee amputation to right leg on 03/13/22, overall healing well and awaiting prosthetic. Saw vascular last on 07/21/2022 who is assisting with this.  Reports left foot pain -- not burning, more pain related.  Has been unable to ambulate well since the amputation.  Patient can transfer independently.  He would benefit from a manual w/c and walker assistance, as he enjoys being active to his highest level and this would increase his current independence.  He is dependent on manual w/c and walker to assist with ADLs.  He has control of upper body and is able to maneuver a manual w/c/walker + cognitively understands how to safely use these devices.  Without a manual w/c/walker his MRADLs would be significantly affected, as he would be unable to transfer to areas in her home to cook and bath + would not be able to attend provider appointments to assess his overall health.   He is also in need of hospital bed as is currently sleeping on floor at niece's house. Hypoglycemic episodes:yes Polydipsia/polyuria: no Visual disturbance: no Chest pain: no Paresthesias: no Glucose Monitoring: yes             Accucheck frequency: daily             Fasting glucose: <130 on average             Post prandial:             Evening:             Before meals: Taking Insulin?: yes             Long acting insulin:              Short acting insulin:   Blood Pressure Monitoring: not checking Retinal Examination: Not up to Date Foot Exam: Up To Date Pneumovax: Up to Date  Influenza: Up to Date Aspirin: no    HYPERTENSION / HYPERLIPIDEMIA/HF Taking Eliquis, Amiodarone, Renvela, and Bumex. Takes Crestor daily for HLD.     He saw Dr. Okey Dupre with cardiology on 11/10/19 -- has not returned since this time, missed follow-up -- placed new referral for him on 05/12/22 and reports not hearing from them.  Recent EF on 01/10/20 was 35-40%.   Satisfied with current treatment? yes Duration of hypertension: chronic BP monitoring frequency: not checking BP range:  BP medication side effects: no Duration of hyperlipidemia: chronic Cholesterol medication side effects: no Cholesterol supplements: none Medication compliance: good compliance Aspirin: no Recent stressors: no Recurrent headaches: no Visual changes: no Palpitations: no Dyspnea: no Chest pain: no Lower extremity edema: at baseline, improved recently Dizzy/lightheaded: no    CHRONIC KIDNEY DISEASE End stage renal disease and obtains dialysis.  Goes to dialysis Monday, Wednesday, Friday.  Sees nephrology in clinic, 06/12/22 last.  He reports taking Norco, has been on long term, for post dialysis discomfort and leg pain.   Reviewed PDMP last fill Ambien 07/22/22 for 90 tablets and Norco 10-325. CKD status: stable Medications renally dose: yes Previous renal evaluation: yes Pneumovax:  Up to Date Influenza Vaccine:  Up to Date    INSOMNIA Used to take Trazodone and Ambien.  Reports he is having difficulty falling asleep.   Duration: chronic Satisfied with sleep quality: yes Difficulty falling asleep: yes Difficulty staying asleep: no Waking a few hours after sleep onset: no Early morning awakenings: no Daytime hypersomnolence: no Wakes feeling refreshed: yes Good sleep hygiene: yes Apnea: no Snoring: no Depressed/anxious mood: no Recent stress: no Restless legs/nocturnal leg  cramps: no Chronic pain/arthritis: no History of sleep study: yes, they reported he needed machine but could not afford Treatments attempted: Ambien   Relevant past medical, surgical, family and social history reviewed and updated as indicated. Interim medical history since our last visit reviewed. Allergies and medications reviewed and updated.  Review of Systems  Constitutional:  Negative for activity change, diaphoresis, fatigue and fever.  Respiratory:  Negative for cough, chest tightness, shortness of breath and wheezing.   Cardiovascular:  Negative for chest pain, palpitations and leg swelling.  Gastrointestinal: Negative.   Endocrine: Negative for polydipsia, polyphagia and polyuria.  Neurological: Negative.   Psychiatric/Behavioral: Negative.      Per HPI unless specifically indicated above     Objective:    BP 138/88 (BP Location: Right Arm, Patient Position: Sitting, Cuff Size: Normal)   Pulse 65   Temp 98.2 F (36.8 C)   Ht 6\' 3"  (1.905 m)   BMI 29.00 kg/m   Wt Readings from Last 3 Encounters:  05/12/22 232 lb (105.2 kg)  04/04/22 231 lb 7.7 oz (105 kg)  03/19/22 235 lb 3.7 oz (106.7 kg)    Physical Exam Vitals and nursing note reviewed.  Constitutional:      General: He is awake. He is not in acute distress.    Appearance: He is well-developed and well-groomed. He is obese. He is not ill-appearing or toxic-appearing.  HENT:     Head: Normocephalic and atraumatic.     Right Ear: Hearing normal. No drainage.     Left Ear: Hearing normal. No drainage.  Eyes:     General: Lids are normal.        Right eye: No discharge.        Left eye: No discharge.     Conjunctiva/sclera: Conjunctivae normal.     Pupils: Pupils are equal, round, and reactive to light.  Neck:     Thyroid: No thyromegaly.     Vascular: No carotid bruit.     Trachea: Trachea normal.  Cardiovascular:     Rate and Rhythm: Normal rate and regular rhythm.     Pulses:          Dorsalis pedis  pulses are 1+ on the left side.       Posterior tibial pulses are 1+ on the left side.     Heart sounds: Normal heart sounds, S1 normal and S2 normal. No murmur heard.    No gallop.     Arteriovenous access: Left arteriovenous access is present.    Comments: Decreased hair pattern bilateral legs. Pulmonary:     Effort: Pulmonary effort is normal. No accessory muscle usage or respiratory distress.     Breath sounds: Normal breath sounds.  Abdominal:     General: Bowel sounds are  normal. There is no distension.     Palpations: Abdomen is soft.     Tenderness: There is no abdominal tenderness.  Musculoskeletal:        General: Normal range of motion.     Cervical back: Normal range of motion and neck supple.     Left lower leg: No edema.     Right Lower Extremity: Right leg is amputated above knee.  Skin:    General: Skin is warm and dry.     Capillary Refill: Capillary refill takes less than 2 seconds.     Findings: No rash.  Neurological:     Mental Status: [name] and oriented to person, place, and time.     Deep Tendon Reflexes: Reflexes are normal and symmetric.  Psychiatric:        Attention and Perception: Attention normal.        Mood and Affect: Mood normal.        Speech: Speech normal.        Behavior: Behavior normal. Behavior is cooperative.        Thought Content: Thought content normal.        Judgment: Judgment normal.     Results for orders placed or performed in visit on 05/12/22  CBC with Differential/Platelet  Result Value Ref Range   WBC 5.3 3.4 - 10.8 x10E3/uL   RBC 4.18 4.14 - 5.80 x10E6/uL   Hemoglobin 12.0 (L) 13.0 - 17.7 g/dL   Hematocrit 65.7 (L) 84.6 - 51.0 %   MCV 89 79 - 97 fL   MCH 28.7 26.6 - 33.0 pg   MCHC 32.3 31.5 - 35.7 g/dL   RDW 96.2 95.2 - 84.1 %   Platelets 175 150 - 450 x10E3/uL   Neutrophils 52 Not Estab. %   Lymphs 25 Not Estab. %   Monocytes 7 Not Estab. %   Eos 14 Not Estab. %   Basos 2 Not Estab. %   Neutrophils  Absolute 2.7 1.4 - 7.0 x10E3/uL   Lymphocytes Absolute 1.3 0.7 - 3.1 x10E3/uL   Monocytes Absolute 0.4 0.1 - 0.9 x10E3/uL   EOS (ABSOLUTE) 0.8 (H) 0.0 - 0.4 x10E3/uL   Basophils Absolute 0.1 0.0 - 0.2 x10E3/uL   Immature Granulocytes 0 Not Estab. %   Immature Grans (Abs) 0.0 0.0 - 0.1 x10E3/uL  Lipid Panel w/o Chol/HDL Ratio  Result Value Ref Range   Cholesterol, Total 174 100 - 199 mg/dL   Triglycerides 82 0 - 149 mg/dL   HDL 72 >32 mg/dL   VLDL Cholesterol Cal 15 5 - 40 mg/dL   LDL Chol Calc (NIH) 87 0 - 99 mg/dL  TSH  Result Value Ref Range   TSH 4.090 0.450 - 4.500 uIU/mL  PSA  Result Value Ref Range   Prostate Specific Ag, Serum 0.6 0.0 - 4.0 ng/mL  Comprehensive metabolic panel  Result Value Ref Range   Glucose 106 (H) 70 - 99 mg/dL   BUN 53 (H) 8 - 27 mg/dL   Creatinine, Ser 4.40 (H) 0.76 - 1.27 mg/dL   eGFR 6 (L) >10 UV/OZD/6.64   BUN/Creatinine Ratio 6 (L) 10 - 24   Sodium 138 134 - 144 mmol/L   Potassium 5.6 (H) 3.5 - 5.2 mmol/L   Chloride 92 (L) 96 - 106 mmol/L   CO2 24 20 - 29 mmol/L   Calcium 8.0 (L) 8.6 - 10.2 mg/dL   Total Protein 7.8 6.0 - 8.5 g/dL   Albumin 4.3 3.8 -  4.9 g/dL   Globulin, Total 3.5 1.5 - 4.5 g/dL   Albumin/Globulin Ratio 1.2 1.2 - 2.2   Bilirubin Total 0.5 0.0 - 1.2 mg/dL   Alkaline Phosphatase 94 44 - 121 IU/L   AST 30 0 - 40 IU/L   ALT 23 0 - 44 IU/L      Assessment & Plan:   Problem List Items Addressed This Visit       Cardiovascular and Mediastinum   Atherosclerotic heart disease of native coronary artery without angina pectoris    Chronic, ongoing issue with no recent CP or NTG use.  Have highly recommend he schedule follow-up with cardiology, has not returned to visit since August 2021 -- new referral placed to return last visit, will check on this.      Chronic atrial fibrillation with RVR (HCC)    Chronic, ongoing.  Rate controlled.  Continue current medication regimen and collaboration with local cardiology office.   Return to office in 3 months.  Have highly recommend he schedule follow-up with cardiology, has not returned since August 2021 -- new referral placed to return last visit, will check on this.      Relevant Orders   Comprehensive metabolic panel   Heart failure with reduced ejection fraction (HCC)    Chronic, stable, euvolemic today.  Continue current medication regimen and adjust as needed.  Continue collaboration with cardiology. Have highly recommend he schedule follow-up with cardiology, last visit August 2021 -- new referral placed last visit, will check on this. - Reminded to call for an overnight weight gain of >2 pounds or a weekly weight weight of >5 pounds - not adding salt to his food and has been reading food labels. Reviewed the importance of keeping daily sodium intake to 2000mg  daily  - Avoid NSAIDS      Relevant Orders   Comprehensive metabolic panel   Hypertensive heart and kidney disease with heart failure and chronic kidney disease stage V (HCC)    Chronic, ongoing, followed by dialysis team.  BP at goal for ESRD in office today.  Continue current medication regimen as prescribed by them.  Attempt to obtain recent notes from [facility].  Labs today: CBC, CMP and Lipid.  Recommend he monitor BP at home a few days a week and document + focus on DASH diet.  Recent nephrology notes reviewed.  Return to office in 3 months.       Relevant Orders   Comprehensive metabolic panel   CBC with Differential/Platelet     Endocrine   Hyperlipidemia associated with type 2 diabetes mellitus (HCC)    Chronic, ongoing.  Continue current medication regimen and adjust as needed.  Lipid panel today.         Relevant Orders   Bayer DCA Hb A1c Waived   Comprehensive metabolic panel   Lipid Panel w/o Chol/HDL Ratio   Secondary hyperparathyroidism of renal origin (HCC)    Chronic, ongoing with ESRD.  Continue collaboration with dialysis team and nephrology, attempt to obtain recent notes from  [facility].      Relevant Orders   Comprehensive metabolic panel   Type 2 diabetes mellitus with ESRD (end-stage renal disease) (HCC) - Primary    Chronic, ongoing with A1c 5.9% today, remaining stable.  Unable to obtain urine for ALB check.  At goal.  Continue off medications at this time.  Recommend he monitor BS at least a couple times a day and document for provider + focus on diabetic diet.  Return  to office in 3 months for follow-up.   - Needs to schedule eye exam, recommend he do so. - Foot exam today - Vaccination, refuses some. - Statin on board.  ESRD, no ACE or ARB at this time.      Relevant Orders   Bayer DCA Hb A1c Waived     Genitourinary   Anemia in chronic kidney disease    Chronic, ongoing, followed by dialysis teams.  Continue current medication regimen as prescribed by them.  Attempt to obtain recent notes from [facility].  Return in 3 months.      Relevant Orders   CBC with Differential/Platelet   ESRD on dialysis (HCC)    Chronic, ongoing, followed by nephrology and dialysis teams.  Unsure about diet compliance as tends to eat fast food on occasion.  Continue current medication regimen as prescribed, refills sent.  Attempt to obtain recent notes from [facility].  Return to office in 3 months for follow-up.        Relevant Orders   Comprehensive metabolic panel     Hematopoietic and Hemostatic   Other thrombophilia (HCC)    With atrial fibrillation and on Eliquis.  Continue to collaborate with cardiology and continue current medication regimen.        Other   Chronic pain syndrome    Ongoing, reports taking Norco only after dialysis and due to recent AKA right side.  Controlled substance contract up to date, unable to void due to dialysis, will consider pain management referral if frequent use of Norco.  Obtain UDS via blood check today, since ESRD and can not void.  Will refill pain medication during this time for 10-325 Norco due to pain with dressing changes  and wound.  He is not to take more then ordered and bring pill bottle to next visit.      Relevant Medications   traZODone (DESYREL) 50 MG tablet   Insomnia    Chronic, ongoing.  Will restart Trazodone at low dose.  Try to avoid return to Ambien unless needed, has controlled subs agreement on chart.  Adjust regimen as needed.  Return in 3 months.      Obesity (BMI 30-39.9)    BMI 29.00, some loss since surgery, monitor.  Recommended eating smaller high protein, low fat meals more frequently and exercising 30 mins a day 5 times a week with a goal of 10-15lb weight loss in the next 3 months. Patient voiced their understanding and motivation to adhere to these recommendations.       S/P AKA (above knee amputation), right (HCC)    Performed 03/13/22.  Continue collaboration with vascular and monitor skin status closely.      Other Visit Diagnoses     High risk medication use       Unable to obtain UDS due to ESRD, will obtain via blood work for annual check due to Opioid and Ambien use.   Relevant Orders   Drug Screen 11 w/Conf, Ser        Follow up plan: Return in about 3 months (around 11/11/2022) for T2DM, ESRD, HTN/HLD, CHRONIC PAIN, MOOD.

## 2022-08-11 NOTE — Assessment & Plan Note (Signed)
With atrial fibrillation and on Eliquis.  Continue to collaborate with cardiology and continue current medication regimen. 

## 2022-08-11 NOTE — Assessment & Plan Note (Signed)
Chronic, ongoing.  Will restart Trazodone at low dose.  Try to avoid return to Ambien unless needed, has controlled subs agreement on chart.  Adjust regimen as needed.  Return in 3 months. 

## 2022-08-11 NOTE — Assessment & Plan Note (Addendum)
Chronic, ongoing.  Rate controlled.  Continue current medication regimen and collaboration with local cardiology office.  Return to office in 3 months.  Have highly recommend he schedule follow-up with cardiology, has not returned since August 2021 -- new referral placed to return last visit, will check on this.

## 2022-08-11 NOTE — Assessment & Plan Note (Signed)
Chronic, ongoing, followed by nephrology and dialysis teams.  Unsure about diet compliance as tends to eat fast food on occasion.  Continue current medication regimen as prescribed, refills sent.  Attempt to obtain recent notes from [facility].  Return to office in 3 months for follow-up.   

## 2022-08-11 NOTE — Assessment & Plan Note (Signed)
Performed 03/13/22.  Continue collaboration with vascular and monitor skin status closely.

## 2022-08-12 ENCOUNTER — Other Ambulatory Visit: Payer: Self-pay | Admitting: Nurse Practitioner

## 2022-08-12 DIAGNOSIS — Z992 Dependence on renal dialysis: Secondary | ICD-10-CM | POA: Diagnosis not present

## 2022-08-12 DIAGNOSIS — N2581 Secondary hyperparathyroidism of renal origin: Secondary | ICD-10-CM | POA: Diagnosis not present

## 2022-08-12 DIAGNOSIS — N186 End stage renal disease: Secondary | ICD-10-CM | POA: Diagnosis not present

## 2022-08-12 MED ORDER — ROSUVASTATIN CALCIUM 40 MG PO TABS: 40.0000 mg | ORAL_TABLET | Freq: Every day | ORAL | 4 refills | Status: DC

## 2022-08-12 NOTE — Progress Notes (Signed)
Good afternoon, please let Deyvion know labs have returned: - Kidney function showing some decline, but this could be related to no dialysis treatment yesterday.  Continue to monitor with nephrology and at dialysis center. - CBC shows stable levels with no anemia.  Platelet count a little low which we will monitor closely, these help clot the blood. - Cholesterol labs remain above goal.  I am going to increase your Rosuvastatin to 40 MG dosing and stop 10 MG dosing.  This will allow tighter control and help prevention of stroke.  Any questions?  We will recheck levels next visit. Keep being amazing!!  Thank you for allowing me to participate in your care.  I appreciate you. Kindest regards, Nathaneil Feagans

## 2022-08-12 NOTE — H&P (View-Only) (Signed)
Good afternoon, please let Bert know labs have returned: - Kidney function showing some decline, but this could be related to no dialysis treatment yesterday.  Continue to monitor with nephrology and at dialysis center. - CBC shows stable levels with no anemia.  Platelet count a little low which we will monitor closely, these help clot the blood. - Cholesterol labs remain above goal.  I am going to increase your Rosuvastatin to 40 MG dosing and stop 10 MG dosing.  This will allow tighter control and help prevention of stroke.  Any questions?  We will recheck levels next visit. Keep being amazing!!  Thank you for allowing me to participate in your care.  I appreciate you. Kindest regards, Lelania Bia 

## 2022-08-14 DIAGNOSIS — I120 Hypertensive chronic kidney disease with stage 5 chronic kidney disease or end stage renal disease: Secondary | ICD-10-CM | POA: Diagnosis not present

## 2022-08-14 DIAGNOSIS — Z992 Dependence on renal dialysis: Secondary | ICD-10-CM | POA: Diagnosis not present

## 2022-08-14 DIAGNOSIS — N186 End stage renal disease: Secondary | ICD-10-CM | POA: Diagnosis not present

## 2022-08-14 DIAGNOSIS — N2581 Secondary hyperparathyroidism of renal origin: Secondary | ICD-10-CM | POA: Diagnosis not present

## 2022-08-17 DIAGNOSIS — N2581 Secondary hyperparathyroidism of renal origin: Secondary | ICD-10-CM | POA: Diagnosis not present

## 2022-08-17 DIAGNOSIS — N186 End stage renal disease: Secondary | ICD-10-CM | POA: Diagnosis not present

## 2022-08-17 DIAGNOSIS — Z992 Dependence on renal dialysis: Secondary | ICD-10-CM | POA: Diagnosis not present

## 2022-08-18 LAB — OPIATES,MS,WB/SP RFX
6-Acetylmorphine: NEGATIVE
Codeine: NEGATIVE ng/mL
Dihydrocodeine: NEGATIVE ng/mL
Hydrocodone: NEGATIVE ng/mL
Hydromorphone: NEGATIVE ng/mL
Morphine: NEGATIVE ng/mL
Opiate Confirmation: NEGATIVE

## 2022-08-18 LAB — CBC WITH DIFFERENTIAL/PLATELET
Basophils Absolute: 0.1 10*3/uL (ref 0.0–0.2)
Basos: 1 %
EOS (ABSOLUTE): 0.3 10*3/uL (ref 0.0–0.4)
Eos: 6 %
Hematocrit: 46.2 % (ref 37.5–51.0)
Hemoglobin: 15.1 g/dL (ref 13.0–17.7)
Immature Grans (Abs): 0 10*3/uL (ref 0.0–0.1)
Immature Granulocytes: 0 %
Lymphocytes Absolute: 1 10*3/uL (ref 0.7–3.1)
Lymphs: 17 %
MCH: 28.9 pg (ref 26.6–33.0)
MCHC: 32.7 g/dL (ref 31.5–35.7)
MCV: 88 fL (ref 79–97)
Monocytes Absolute: 0.3 10*3/uL (ref 0.1–0.9)
Monocytes: 5 %
Neutrophils Absolute: 3.9 10*3/uL (ref 1.4–7.0)
Neutrophils: 71 %
Platelets: 131 10*3/uL — ABNORMAL LOW (ref 150–450)
RBC: 5.23 x10E6/uL (ref 4.14–5.80)
RDW: 15.4 % (ref 11.6–15.4)
WBC: 5.5 10*3/uL (ref 3.4–10.8)

## 2022-08-18 LAB — COMPREHENSIVE METABOLIC PANEL
ALT: 13 IU/L (ref 0–44)
AST: 19 IU/L (ref 0–40)
Albumin/Globulin Ratio: 1.2 (ref 1.2–2.2)
Albumin: 4.3 g/dL (ref 3.9–4.9)
Alkaline Phosphatase: 56 IU/L (ref 44–121)
BUN/Creatinine Ratio: 7 — ABNORMAL LOW (ref 10–24)
BUN: 96 mg/dL (ref 8–27)
Bilirubin Total: 0.8 mg/dL (ref 0.0–1.2)
CO2: 17 mmol/L — ABNORMAL LOW (ref 20–29)
Calcium: 9.2 mg/dL (ref 8.6–10.2)
Chloride: 89 mmol/L — ABNORMAL LOW (ref 96–106)
Creatinine, Ser: 13.58 mg/dL — ABNORMAL HIGH (ref 0.76–1.27)
Globulin, Total: 3.5 g/dL (ref 1.5–4.5)
Glucose: 77 mg/dL (ref 70–99)
Potassium: 6.2 mmol/L — ABNORMAL HIGH (ref 3.5–5.2)
Sodium: 135 mmol/L (ref 134–144)
Total Protein: 7.8 g/dL (ref 6.0–8.5)
eGFR: 4 mL/min/{1.73_m2} — ABNORMAL LOW (ref >59)

## 2022-08-18 LAB — DRUG SCREEN 11 W/CONF, SE
Amphetamines, IA: NEGATIVE ng/mL
Barbiturates, IA: NEGATIVE ug/mL
Benzodiazepines, IA: NEGATIVE ng/mL
Cocaine & Metabolite, IA: NEGATIVE ng/mL
Ethyl Alcohol, Enz: NEGATIVE gm/dL
Methadone, IA: NEGATIVE ng/mL
Opiates, IA: NEGATIVE ng/mL
Oxycodones, IA: NEGATIVE ng/mL
Phencyclidine, IA: NEGATIVE ng/mL
Propoxyphene, IA: NEGATIVE ng/mL
THC(Marijuana) Metabolite, IA: NEGATIVE ng/mL

## 2022-08-18 LAB — LIPID PANEL W/O CHOL/HDL RATIO
Cholesterol, Total: 156 mg/dL (ref 100–199)
HDL: 61 mg/dL (ref >39)
LDL Chol Calc (NIH): 83 mg/dL (ref 0–99)
Triglycerides: 57 mg/dL (ref 0–149)
VLDL Cholesterol Cal: 12 mg/dL (ref 5–40)

## 2022-08-18 LAB — OXYCODONES,MS,WB/SP RFX
Oxycocone: NEGATIVE ng/mL
Oxycodones Confirmation: NEGATIVE
Oxymorphone: NEGATIVE ng/mL

## 2022-08-19 ENCOUNTER — Telehealth (INDEPENDENT_AMBULATORY_CARE_PROVIDER_SITE_OTHER): Payer: Self-pay

## 2022-08-19 ENCOUNTER — Telehealth (INDEPENDENT_AMBULATORY_CARE_PROVIDER_SITE_OTHER): Payer: Self-pay | Admitting: Nurse Practitioner

## 2022-08-19 DIAGNOSIS — N186 End stage renal disease: Secondary | ICD-10-CM | POA: Diagnosis not present

## 2022-08-19 DIAGNOSIS — Z992 Dependence on renal dialysis: Secondary | ICD-10-CM | POA: Diagnosis not present

## 2022-08-19 DIAGNOSIS — N2581 Secondary hyperparathyroidism of renal origin: Secondary | ICD-10-CM | POA: Diagnosis not present

## 2022-08-19 NOTE — Telephone Encounter (Signed)
Spoke with the patient's daughter and the patient is scheduled with Dr. Wyn Quaker for a left arm fistulagram on 08/27/22, with a 12:00 pm arrival time to the [facility] Pre-procedure instructions were discussed and will be mailed. I did inquire about the Eliquis on the patient's chart and was told he no longer takes this medication.

## 2022-08-19 NOTE — Telephone Encounter (Signed)
Patient called asking for a Rx his prosthetic .    Please call and advise

## 2022-08-21 DIAGNOSIS — N186 End stage renal disease: Secondary | ICD-10-CM | POA: Diagnosis not present

## 2022-08-21 DIAGNOSIS — N2581 Secondary hyperparathyroidism of renal origin: Secondary | ICD-10-CM | POA: Diagnosis not present

## 2022-08-21 DIAGNOSIS — Z992 Dependence on renal dialysis: Secondary | ICD-10-CM | POA: Diagnosis not present

## 2022-08-21 NOTE — Telephone Encounter (Signed)
On your desk

## 2022-08-24 ENCOUNTER — Telehealth: Payer: Self-pay

## 2022-08-24 ENCOUNTER — Telehealth: Payer: Self-pay | Admitting: Nurse Practitioner

## 2022-08-24 DIAGNOSIS — N2581 Secondary hyperparathyroidism of renal origin: Secondary | ICD-10-CM | POA: Diagnosis not present

## 2022-08-24 DIAGNOSIS — N186 End stage renal disease: Secondary | ICD-10-CM | POA: Diagnosis not present

## 2022-08-24 DIAGNOSIS — Z992 Dependence on renal dialysis: Secondary | ICD-10-CM | POA: Diagnosis not present

## 2022-08-24 MED ORDER — HYDROCODONE-ACETAMINOPHEN 5-325 MG PO TABS: 1.0000 | ORAL_TABLET | Freq: Three times a day (TID) | ORAL | 0 refills | Status: DC | PRN

## 2022-08-24 NOTE — Telephone Encounter (Signed)
Patients daughter came into the office and states that the patient need a refill on his Hydrocodone 10-325 patient was last seen by provider on 07/26/22 Please advise.

## 2022-08-24 NOTE — Telephone Encounter (Signed)
Rx has been faxed.

## 2022-08-24 NOTE — Addendum Note (Signed)
Addended by: Aura Dials T on: 08/24/2022 05:21 PM   Modules accepted: Orders

## 2022-08-24 NOTE — Telephone Encounter (Signed)
Patient is asking for refill of his Hydrocodone. Please advise not on active med list

## 2022-08-24 NOTE — Telephone Encounter (Signed)
Has been routed to provider

## 2022-08-24 NOTE — Telephone Encounter (Signed)
Pt's daughter [name] is calling in requesting the status of HYDROcodone-acetaminophen (NORCO) 10-325 MG tablet being sent to the pharmacy. [name] says Jolene usually has to send a new prescription for refills, and says pt is currently completely out of medication. Please advise.

## 2022-08-25 NOTE — Telephone Encounter (Signed)
Patient's daughter was made aware that medication was sent in last night for him

## 2022-08-26 ENCOUNTER — Telehealth: Payer: Self-pay | Admitting: *Deleted

## 2022-08-26 DIAGNOSIS — N2581 Secondary hyperparathyroidism of renal origin: Secondary | ICD-10-CM | POA: Diagnosis not present

## 2022-08-26 DIAGNOSIS — N186 End stage renal disease: Secondary | ICD-10-CM | POA: Diagnosis not present

## 2022-08-26 DIAGNOSIS — Z992 Dependence on renal dialysis: Secondary | ICD-10-CM | POA: Diagnosis not present

## 2022-08-26 NOTE — Telephone Encounter (Signed)
Patient called for lab results and notified:  - Kidney function showing some decline, but this could be related to no dialysis treatment yesterday.  Continue to monitor with nephrology and at dialysis center. - CBC shows stable levels with no anemia.  Platelet count a little low which we will monitor closely, these help clot the blood. - Cholesterol labs remain above goal.  I am going to increase your Rosuvastatin to 40 MG dosing and stop 10 MG dosing.  This will allow tighter control and help prevention of stroke.  Any questions?  We will recheck levels next visit.  Patient does have a visit scheduled.

## 2022-08-27 ENCOUNTER — Encounter: Payer: Self-pay | Admitting: Vascular Surgery

## 2022-08-27 ENCOUNTER — Telehealth: Payer: Self-pay | Admitting: Nurse Practitioner

## 2022-08-27 ENCOUNTER — Ambulatory Visit: Payer: Self-pay

## 2022-08-27 ENCOUNTER — Encounter
Admission: RE | Disposition: A | Discharge status: Home / Self Care | Payer: Self-pay | Source: Home / Self Care | Attending: Vascular Surgery

## 2022-08-27 ENCOUNTER — Other Ambulatory Visit: Payer: Self-pay

## 2022-08-27 ENCOUNTER — Ambulatory Visit
Admission: RE | Admit: 2022-08-27 | Discharge: 2022-08-27 | Disposition: A | Discharge status: Home / Self Care | Payer: Medicare HMO | Attending: Vascular Surgery | Admitting: Vascular Surgery

## 2022-08-27 DIAGNOSIS — T82858A Stenosis of vascular prosthetic devices, implants and grafts, initial encounter: Secondary | ICD-10-CM | POA: Insufficient documentation

## 2022-08-27 DIAGNOSIS — Z992 Dependence on renal dialysis: Secondary | ICD-10-CM | POA: Diagnosis not present

## 2022-08-27 DIAGNOSIS — Y841 Kidney dialysis as the cause of abnormal reaction of the patient, or of later complication, without mention of misadventure at the time of the procedure: Secondary | ICD-10-CM | POA: Diagnosis not present

## 2022-08-27 DIAGNOSIS — Z79899 Other long term (current) drug therapy: Secondary | ICD-10-CM | POA: Insufficient documentation

## 2022-08-27 DIAGNOSIS — T82898A Other specified complication of vascular prosthetic devices, implants and grafts, initial encounter: Secondary | ICD-10-CM | POA: Diagnosis not present

## 2022-08-27 DIAGNOSIS — N186 End stage renal disease: Secondary | ICD-10-CM | POA: Diagnosis not present

## 2022-08-27 HISTORY — PX: A/V FISTULAGRAM: CATH118298

## 2022-08-27 LAB — POTASSIUM (ARMC VASCULAR LAB ONLY): Potassium (ARMC vascular lab): 5.2 mmol/L — ABNORMAL HIGH (ref 3.5–5.1)

## 2022-08-27 LAB — GLUCOSE, CAPILLARY
Glucose-Capillary: 98 mg/dL (ref 70–99)
Glucose-Capillary: 69 mg/dL — ABNORMAL LOW (ref 70–99)
Glucose-Capillary: 76 mg/dL (ref 70–99)

## 2022-08-27 SURGERY — A/V FISTULAGRAM
Anesthesia: Moderate Sedation | Laterality: Left

## 2022-08-27 MED ORDER — SODIUM CHLORIDE 0.9 % IV SOLN: INTRAVENOUS | Status: DC

## 2022-08-27 MED ORDER — FENTANYL CITRATE PF 50 MCG/ML IJ SOSY
PREFILLED_SYRINGE | INTRAMUSCULAR | Status: AC
  Filled 2022-08-27: qty 1

## 2022-08-27 MED ORDER — CEFAZOLIN SODIUM-DEXTROSE 1-4 GM/50ML-% IV SOLN
1.0000 g | INTRAVENOUS | Status: AC
  Administered 2022-08-27: 1 g via INTRAVENOUS

## 2022-08-27 MED ORDER — FENTANYL CITRATE (PF) 100 MCG/2ML IJ SOLN
INTRAMUSCULAR | Status: DC | PRN
  Administered 2022-08-27 (×2): 50 ug via INTRAVENOUS

## 2022-08-27 MED ORDER — IODIXANOL 320 MG/ML IV SOLN
INTRAVENOUS | Status: DC | PRN
  Administered 2022-08-27: 35 mL via INTRAVENOUS

## 2022-08-27 MED ORDER — MIDAZOLAM HCL 2 MG/2ML IJ SOLN
INTRAMUSCULAR | Status: AC
  Filled 2022-08-27: qty 2

## 2022-08-27 MED ORDER — MIDAZOLAM HCL 2 MG/ML PO SYRP: 8.0000 mg | ORAL_SOLUTION | Freq: Once | ORAL | Status: DC | PRN

## 2022-08-27 MED ORDER — CEFAZOLIN SODIUM-DEXTROSE 1-4 GM/50ML-% IV SOLN
INTRAVENOUS | Status: AC
  Filled 2022-08-27: qty 50

## 2022-08-27 MED ORDER — HYDROMORPHONE HCL 1 MG/ML IJ SOLN: 1.0000 mg | Freq: Once | INTRAMUSCULAR | Status: DC | PRN

## 2022-08-27 MED ORDER — MIDAZOLAM HCL 2 MG/2ML IJ SOLN
INTRAMUSCULAR | Status: DC | PRN
  Administered 2022-08-27: 2 mg via INTRAVENOUS
  Administered 2022-08-27: 1 mg via INTRAVENOUS

## 2022-08-27 MED ORDER — HEPARIN SODIUM (PORCINE) 1000 UNIT/ML IJ SOLN
INTRAMUSCULAR | Status: AC
  Filled 2022-08-27: qty 10

## 2022-08-27 MED ORDER — METHYLPREDNISOLONE SODIUM SUCC 125 MG IJ SOLR: 125.0000 mg | Freq: Once | INTRAMUSCULAR | Status: DC | PRN

## 2022-08-27 MED ORDER — ONDANSETRON HCL 4 MG/2ML IJ SOLN: 4.0000 mg | Freq: Four times a day (QID) | INTRAMUSCULAR | Status: DC | PRN

## 2022-08-27 MED ORDER — HEPARIN (PORCINE) IN NACL 1000-0.9 UT/500ML-% IV SOLN
INTRAVENOUS | Status: DC | PRN
  Administered 2022-08-27: 1000 mL

## 2022-08-27 MED ORDER — FAMOTIDINE 20 MG PO TABS: 40.0000 mg | ORAL_TABLET | Freq: Once | ORAL | Status: DC | PRN

## 2022-08-27 MED ORDER — DIPHENHYDRAMINE HCL 50 MG/ML IJ SOLN: 50.0000 mg | Freq: Once | INTRAMUSCULAR | Status: DC | PRN

## 2022-08-27 MED ORDER — HEPARIN SODIUM (PORCINE) 1000 UNIT/ML IJ SOLN
INTRAMUSCULAR | Status: DC | PRN
  Administered 2022-08-27: 4000 [IU] via INTRAVENOUS

## 2022-08-27 SURGICAL SUPPLY — 13 items
ADH SKN CLS APL DERMABOND .7 (GAUZE/BANDAGES/DRESSINGS) ×1
BALLN LUTONIX AV 8X60X75 (BALLOONS) ×2
BALLN LUTONIX AV 9X60X75 (BALLOONS) ×1
BALLOON LUTONIX AV 8X60X75 (BALLOONS) IMPLANT
BALLOON LUTONIX AV 9X60X75 (BALLOONS) IMPLANT
DERMABOND ADVANCED .7 DNX12 (GAUZE/BANDAGES/DRESSINGS) IMPLANT
GLIDEWIRE ADV .035X180CM (WIRE) IMPLANT
KIT ENCORE 26 ADVANTAGE (KITS) IMPLANT
KIT MICROPUNCTURE NIT STIFF (SHEATH) IMPLANT
PACK ANGIOGRAPHY (CUSTOM PROCEDURE TRAY) ×1 IMPLANT
SHEATH BRITE TIP 6FRX5.5 (SHEATH) IMPLANT
SUT MNCRL AB 4-0 PS2 18 (SUTURE) IMPLANT
SUT PROLENE 0 CT 1 30 (SUTURE) IMPLANT

## 2022-08-27 NOTE — Telephone Encounter (Signed)
Copied from CRM [id number]. Topic: General - Other >> Aug 27, 2022 12:31 PM Dondra Prader A wrote: Reason for CRM: Pt daughter [name] is wanting to see if the pt can get his colonoscopy done at his next appt on 11/10/22 with his PCP. Please advise.

## 2022-08-27 NOTE — Progress Notes (Signed)
Dr. Wyn Quaker at bedside, speaking to pt. Re: procedural results. Pt. Verbalized understanding of conversation.

## 2022-08-27 NOTE — Telephone Encounter (Signed)
Pt daughter Adam Howell is calling to see why pt medication was changed: HYDROcodone-acetaminophen (NORCO) 5-325 MG tablet. Pt states that this dosage does not do anything for him and he is in pain. Pt daughter Adam Howell states that she does not like to see him take more than one pill but is having to take more due to the pain and his amputation. Pt is at an appt right now but his daughter would like a call back from the nurse.   Chief Complaint: Pt.'d daughter, Adam Howell, concerned because pt.'s pain medication dosage has been decreased and is not helping his pain. Pt. Currently having a surgical procedure today. Symptoms: Pain Frequency: This week Pertinent Negatives: Patient denies  Disposition: [] ED /[] Urgent Care (no appt availability in office) / [] Appointment(In office/virtual)/ []  [facility]/ [] Home Care/ [] Refused Recommended Disposition /[] [facility]/ [x]  Follow-up with PCP Additional Notes: Please advise daughter. Asking for his original dose of Norco 10 mg.  Reason for Disposition  [1] Caller has URGENT medicine question about med that PCP or specialist prescribed AND [2] triager unable to answer question  Answer Assessment - Initial Assessment Questions 1. NAME of MEDICINE: "What medicine(s) are you calling about?"     Norco 2. QUESTION: "What is your question?" (e.g., double dose of medicine, side effect)     Dosage  3. PRESCRIBER: "Who prescribed the medicine?" Reason: if prescribed by specialist, call should be referred to that group.     [place] 4. SYMPTOMS: "Do you have any symptoms?" If Yes, ask: "What symptoms are you having?"  "How bad are the symptoms (e.g., mild, moderate, severe)     The decreased dose is not helping his pain 5. PREGNANCY:  "Is there any chance that you are pregnant?" "When was your last menstrual period?"     N/a  Protocols used: Medication Question Call-A-AH

## 2022-08-27 NOTE — Interval H&P Note (Signed)
History and Physical Interval Note:  08/27/2022 11:52 AM  Adam Howell  has presented today for surgery, with the diagnosis of L arm fistula   End Stage Renal.  The various methods of treatment have been discussed with the patient and family. After consideration of risks, benefits and other options for treatment, the patient has consented to  Procedure(s): A/V Fistulagram (Left) as a surgical intervention.  The patient's history has been reviewed, patient examined, no change in status, stable for surgery.  I have reviewed the patient's chart and labs.  Questions were answered to the patient's satisfaction.   We were informed his fistula was not functioning well.  We were asked to perform a fistulagram.  Plan for this today.  Festus Barren

## 2022-08-27 NOTE — Op Note (Signed)
[facility]    OPERATIVE NOTE   PROCEDURE: 1.   Left radiocephalic arteriovenous fistula cannulation under ultrasound guidance 2.   Left arm fistulagram including central venogram 3.   Percutaneous transluminal angioplasty of the forearm cephalic vein with 8 mm diameter Lutonix drug-coated angioplasty balloon in 2 locations 4.   Percutaneous transluminal angioplasty of the mid to distal upper arm basilic vein in 2 locations with 9 mm diameter 8 mm diameter Lutonix drug-coated angioplasty balloons  PRE-OPERATIVE DIAGNOSIS: 1. ESRD 2. Poorly functional left radiocephalic AVF  POST-OPERATIVE DIAGNOSIS: same as above   SURGEON: Festus Barren, MD  ANESTHESIA: local with MCS  ESTIMATED BLOOD LOSS: 5 cc  FINDING(S): Left radiocephalic AV fistula with 2 stents in the forearm cephalic vein.  There is an aneurysmal dilatation of the 2 access sites with stenosis of about 70% between the stent and the aneurysmal access site as well as a stenosis just proximal to the stent in the proximal forearm cephalic vein in the 80% range.  The upper arm drainage was entirely through the basilic vein and there were 2 areas of stenosis in the mid to distal and distal upper arm basilic vein of greater than 78%  SPECIMEN(S):  None  CONTRAST: 35 cc  FLUORO TIME: 3.1 minutes  MODERATE CONSCIOUS SEDATION TIME: Approximately 30 minutes with 3 mg of Versed and 100 mcg of Fentanyl   INDICATIONS: Adam Howell is a 62 y.o. male who presents with malfunctioning left radiocephalic arteriovenous fistula.  The patient is scheduled for left arm fistulagram.  The patient is aware the risks include but are not limited to: bleeding, infection, thrombosis of the cannulated access, and possible anaphylactic reaction to the contrast.  The patient is aware of the risks of the procedure and elects to proceed forward.  DESCRIPTION: After full informed written consent was obtained, the patient was  brought back to the angiography suite and placed supine upon the angiography table.  The patient was connected to monitoring equipment. Moderate conscious sedation was administered with a face to face encounter with the patient throughout the procedure with my supervision of the RN administering medicines and monitoring the patient's vital signs and mental status throughout from the start of the procedure until the patient was taken to the recovery room. The left arm was prepped and draped in the standard fashion for a percutaneous access intervention.  Under ultrasound guidance, the left radiocephalic arteriovenous fistula was cannulated with a micropuncture needle under direct ultrasound guidance where it was patent and a permanent image was performed.  The microwire was advanced into the fistula and the needle was exchanged for the a microsheath.  I then upsized to a 6 Fr Sheath and imaging was performed.  Hand injections were completed to image the access including the central venous system. This demonstrated Left radiocephalic AV fistula with 2 stents in the forearm cephalic vein.  There is an aneurysmal dilatation of the 2 access sites with stenosis of about 70% between the stent and the aneurysmal access site as well as a stenosis just proximal to the stent in the proximal forearm cephalic vein in the 80% range.  The upper arm drainage was entirely through the basilic vein and there were 2 areas of stenosis in the mid to distal and distal upper arm basilic vein of greater than 29%.  Based on the images, this patient will need intervention to these areas of stenosis to salvage the fistula. I then gave the patient 4000  units of intravenous heparin.  I then crossed the stenoses with an Advantage wire.  Based on the imaging, a 8 mm x 6 cm Lutonix drug-coated angioplasty balloon was selected.  The balloon was centered around the most distal forearm cephalic vein stenosis and inflated to 10 ATM for 1 minute(s).   It was then advanced to the more proximal forearm cephalic vein stenosis and again inflated to 10 atm for 1 minute on completion imaging, a less than 25% residual stenosis was present in both locations.  I then turned my attention to the upper arm basilic vein stenosis.  The 1 more distal near the elbow was treated with a 9 mm diameter by 6 cm length Lutonix drug-coated angioplasty balloon inflated to 6 atm for 1 minute.  The 1 in the mid to distal upper arm basilic vein was treated with an 8 mm diameter by 6 cm length Lutonix drug-coated angioplasty balloon inflated to 10 atm for 1 minute.  Completion imaging following this showed less than 20% residual stenosis in both areas after angioplasty.     Based on the completion imaging, no further intervention is necessary.  The wire and balloon were removed from the sheath.  A 4-0 Monocryl purse-string suture was sewn around the sheath.  The sheath was removed while tying down the suture.  A sterile bandage was applied to the puncture site.  COMPLICATIONS: None  CONDITION: Stable   Festus Barren  08/27/2022 2:29 PM   This note was created with Dragon Medical transcription system. Any errors in dictation are purely unintentional.

## 2022-08-28 ENCOUNTER — Ambulatory Visit: Payer: Self-pay | Admitting: *Deleted

## 2022-08-28 ENCOUNTER — Encounter: Payer: Self-pay | Admitting: Vascular Surgery

## 2022-08-28 DIAGNOSIS — N2581 Secondary hyperparathyroidism of renal origin: Secondary | ICD-10-CM | POA: Diagnosis not present

## 2022-08-28 DIAGNOSIS — Z992 Dependence on renal dialysis: Secondary | ICD-10-CM | POA: Diagnosis not present

## 2022-08-28 DIAGNOSIS — N186 End stage renal disease: Secondary | ICD-10-CM | POA: Diagnosis not present

## 2022-08-28 NOTE — Telephone Encounter (Signed)
Please see other message concerning this matter

## 2022-08-28 NOTE — Telephone Encounter (Signed)
Reason for Disposition  [1] Caller has URGENT medicine question about med that PCP or specialist prescribed AND [2] triager unable to answer question  Answer Assessment - Initial Assessment Questions 1. NAME of MEDICINE: "What medicine(s) are you calling about?"     Daughter wants to know why the dosage change on the hydrocodone.  Jolene usually gives him 10 mg but this rx was for 5 mg.   No one has called me back.  I called yesterday. 2. QUESTION: "What is your question?" (e.g., double dose of medicine, side effect)     Hydrocodone dose changed to 5 mg.  Wanting to know why because he usually gets 10 mg.   He had a procedure done yesterday and he is in pain.  3. PRESCRIBER: "Who prescribed the medicine?" Reason: if prescribed by specialist, call should be referred to that group.     Aura Dials, NP 4. SYMPTOMS: "Do you have any symptoms?" If Yes, ask: "What symptoms are you having?"  "How bad are the symptoms (e.g., mild, moderate, severe)     Pt in pain 5. PREGNANCY:  "Is there any chance that you are pregnant?" "When was your last menstrual period?"     N/A  Protocols used: Medication Question Call-A-AH

## 2022-08-28 NOTE — Telephone Encounter (Signed)
Patient made aware of Provider's recommendations and verbalized understanding.   

## 2022-08-28 NOTE — Telephone Encounter (Signed)
  Chief Complaint: Rx for Hydrocodone is for 5 mg and he usually gets 10 mg.   Why the change?   Can 10 mg be called in? Symptoms: Per daughter he is in pain from a procedure he had done yesterday. Frequency: N/A Pertinent Negatives: Patient denies N/A Disposition: [] ED /[] Urgent Care (no appt availability in office) / [] Appointment(In office/virtual)/ []  [facility]/ [] Home Care/ [] Refused Recommended Disposition /[] [facility]/ [x]  Follow-up with PCP Additional Notes: Message sent to Aura Dials, NP with daughter's request for 10 mg Hydrocodone.

## 2022-08-31 DIAGNOSIS — N186 End stage renal disease: Secondary | ICD-10-CM | POA: Diagnosis not present

## 2022-08-31 DIAGNOSIS — Z992 Dependence on renal dialysis: Secondary | ICD-10-CM | POA: Diagnosis not present

## 2022-08-31 DIAGNOSIS — N2581 Secondary hyperparathyroidism of renal origin: Secondary | ICD-10-CM | POA: Diagnosis not present

## 2022-09-02 DIAGNOSIS — N186 End stage renal disease: Secondary | ICD-10-CM | POA: Diagnosis not present

## 2022-09-02 DIAGNOSIS — N2581 Secondary hyperparathyroidism of renal origin: Secondary | ICD-10-CM | POA: Diagnosis not present

## 2022-09-02 DIAGNOSIS — Z992 Dependence on renal dialysis: Secondary | ICD-10-CM | POA: Diagnosis not present

## 2022-09-02 NOTE — H&P (Signed)
[facility] Admission History & Physical  MRN : [id number]  Adam Howell is a 62 y.o. (06/01/1960) male who presents with chief complaint of No chief complaint on file. Marland Kitchen  History of Present Illness: I am asked to evaluate the patient by the dialysis center. The patient was sent here because they were unable to achieve adequate dialysis this morning. Furthermore the Center states there is very poor thrill and bruit. The patient states there there have been increasing problems with the access, such as "pulling clots" during dialysis and prolonged bleeding after decannulation. The patient estimates these problems have been going on for several weeks. The patient is unaware of any other change.   Patient denies pain or tenderness overlying the access.  There is no pain with dialysis.  The patient denies hand pain or finger pain consistent with steal syndrome.    There have been past interventions or declots of this access.  The patient is not chronically hypotensive on dialysis.  No current facility-administered medications for this encounter.   Current Outpatient Medications  Medication Sig Dispense Refill   amiodarone (PACERONE) 200 MG tablet Take 1 tablet (200 mg total) by mouth 2 (two) times daily. 60 tablet 0   calcitRIOL (ROCALTROL) 0.5 MCG capsule Take 3 capsules (1.5 mcg total) by mouth every Monday, Wednesday, and Friday at 6 PM.     cinacalcet (SENSIPAR) 30 MG tablet Take 1 tablet (30 mg total) by mouth daily with breakfast. 30 tablet 0   ELIQUIS 2.5 MG TABS tablet Take 1 tablet (2.5 mg total) by mouth 2 (two) times daily. 180 tablet 0   HYDROcodone-acetaminophen (NORCO) 5-325 MG tablet Take 1 tablet by mouth every 8 (eight) hours as needed for moderate pain. 90 tablet 0   melatonin 5 MG TABS Take 2 tablets (10 mg total) by mouth at bedtime as needed. 60 tablet 0   multivitamin (RENA-VIT) TABS tablet Take 1 tablet by mouth at bedtime. 30 tablet 0    rosuvastatin (CRESTOR) 40 MG tablet Take 1 tablet (40 mg total) by mouth daily. 90 tablet 4   senna (SENOKOT) 8.6 MG TABS tablet Take 2 tablets (17.2 mg total) by mouth at bedtime. 60 tablet 0   traZODone (DESYREL) 50 MG tablet Take 1 tablet (50 mg total) by mouth at bedtime. 90 tablet 4   bumetanide (BUMEX) 1 MG tablet Take 1 tablet (1 mg total) by mouth 2 (two) times daily. (Patient not taking: Reported on 08/27/2022) 90 tablet 4   hydrocerin (EUCERIN) CREA Apply 1 Application topically 2 (two) times daily. To foot (Patient not taking: Reported on 08/27/2022) 454 g 0    Past Medical History:  Diagnosis Date   Chronic kidney disease    Congestive heart failure (HCC)    Diabetes mellitus without complication (HCC)    Difficult intubation    Hyperlipidemia    Hypertension     Past Surgical History:  Procedure Laterality Date   A/V FISTULAGRAM Left 08/27/2022   Procedure: A/V Fistulagram;  Surgeon: Annice Needy, MD;  Location: [facility] INVASIVE CV LAB;  Service: Cardiovascular;  Laterality: Left;   AMPUTATION Right 03/13/2022   Procedure: AMPUTATION ABOVE KNEE;  Surgeon: Annice Needy, MD;  Location: [facility] ORS;  Service: General;  Laterality: Right;   APPLICATION OF WOUND VAC Right 03/07/2022   Procedure: APPLICATION OF WOUND VAC;  Surgeon: Bertram Denver, MD;  Location: [facility] ORS;  Service: Vascular;  Laterality: Right;  GAAC [id number]   WOUND  DEBRIDEMENT Left    WOUND DEBRIDEMENT Right 01/16/2022   Procedure: DEBRIDEMENT WOUND;  Surgeon: Renford Dills, MD;  Location: [facility] ORS;  Service: Vascular;  Laterality: Right;  Wound vac placement   WOUND DEBRIDEMENT Right 03/07/2022   Procedure: DEBRIDEMENT WOUND RIGHT LOWER EXTREMITY;  Surgeon: Bertram Denver, MD;  Location: [facility] ORS;  Service: Vascular;  Laterality: Right;     Social History   Tobacco Use   Smoking status: Never   Smokeless tobacco: Never  Vaping Use   Vaping Use: Never used  Substance Use Topics   Alcohol use: Not Currently    Drug use: Never    Family History  Problem Relation Age of Onset   Heart failure Mother    Cirrhosis Father    Hypertension Daughter     No family history of bleeding or clotting disorders, autoimmune disease or porphyria  Allergies  Allergen Reactions   Tramadol Rash     REVIEW OF SYSTEMS (Negative unless checked)  Constitutional: [] Weight loss  [] Fever  [] Chills Cardiac: [] Chest pain   [] Chest pressure   [] Palpitations   [] Shortness of breath when laying flat   [] Shortness of breath at rest   [x] Shortness of breath with exertion. Vascular:  [] Pain in legs with walking   [] Pain in legs at rest   [] Pain in legs when laying flat   [] Claudication   [] Pain in feet when walking  [] Pain in feet at rest  [] Pain in feet when laying flat   [] History of DVT   [] Phlebitis   [] Swelling in legs   [] Varicose veins   [] Non-healing ulcers Pulmonary:   [] Uses home oxygen   [] Productive cough   [] Hemoptysis   [] Wheeze  [] COPD   [] Asthma Neurologic:  [] Dizziness  [] Blackouts   [] Seizures   [] History of stroke   [] History of TIA  [] Aphasia   [] Temporary blindness   [] Dysphagia   [] Weakness or numbness in arms   [] Weakness or numbness in legs Musculoskeletal:  [x] Arthritis   [] Joint swelling   [] Joint pain   [] Low back pain Hematologic:  [] Easy bruising  [] Easy bleeding   [] Hypercoagulable state   [x] Anemic  [] Hepatitis Gastrointestinal:  [] Blood in stool   [] Vomiting blood  [] Gastroesophageal reflux/heartburn   [] Difficulty swallowing. Genitourinary:  [x] Chronic kidney disease   [] Difficult urination  [] Frequent urination  [] Burning with urination   [] Blood in urine Skin:  [] Rashes   [] Ulcers   [] Wounds Psychological:  [] History of anxiety   []  History of major depression.  Physical Examination  Vitals:   08/27/22 1500 08/27/22 1515 08/27/22 1530 08/27/22 1545  BP: 139/87  (!) 137/99   Pulse: 66 (!) 113 77 75  Resp: 19 (!) 23 13   Temp:      TempSrc:      SpO2: 98% (!) 82% 95% 96%  Weight:       Height:       Body mass index is 31.25 kg/m. Gen: WD/WN, NAD Head: [place]/AT, No temporalis wasting.  Ear/Nose/Throat: Hearing grossly intact, nares w/o erythema or drainage, oropharynx w/o Erythema/Exudate,  Eyes: Conjunctiva clear, sclera non-icteric Neck: Trachea midline.  No JVD.  Pulmonary:  Good air movement, respirations not labored, no use of accessory muscles.  Cardiac: RRR, normal S1, S2. Vascular: Left forearm cephalic vein somewhat pulsatile Vessel Right Left  Radial Palpable Palpable   Musculoskeletal: M/S 5/5 throughout.  Extremities without ischemic changes.  No deformity or atrophy.  Neurologic: Sensation grossly intact in extremities.  Symmetrical.  Speech is fluent.  Motor exam as listed above. Psychiatric: Judgment intact, Mood & affect appropriate for pt's clinical situation. Dermatologic: No rashes or ulcers noted.  No cellulitis or open wounds.    CBC Lab Results  Component Value Date   WBC 5.5 08/11/2022   HGB 15.1 08/11/2022   HCT 46.2 08/11/2022   MCV 88 08/11/2022   PLT 131 (L) 08/11/2022    BMET    Component Value Date/Time   NA 135 08/11/2022 1336   K 6.2 (H) 08/11/2022 1336   CL 89 (L) 08/11/2022 1336   CO2 17 (L) 08/11/2022 1336   GLUCOSE 77 08/11/2022 1336   GLUCOSE 92 04/17/2022 0727   BUN 96 (HH) 08/11/2022 1336   CREATININE 13.58 (H) 08/11/2022 1336   CALCIUM 9.2 08/11/2022 1336   GFRNONAA 8 (L) 04/17/2022 0727   CrCl cannot be calculated (Patient's most recent lab result is older than the maximum 21 days allowed.).  COAG Lab Results  Component Value Date   INR 1.2 03/05/2022    Radiology PERIPHERAL VASCULAR CATHETERIZATION  Result Date: 08/27/2022 See surgical note for result.   Assessment/Plan 1.  Complication dialysis device with dysfunction of AV access:  Patient's dialysis access is malfunctioning. The patient will undergo angiography and correction of any problems using interventional techniques with the hope of  restoring function to the access.  The risks and benefits were described to the patient.  All questions were answered.  The patient agrees to proceed with angiography and intervention. Potassium will be drawn to ensure that it is an appropriate level prior to performing intervention. 2.  End-stage renal disease requiring hemodialysis:  Patient will continue dialysis therapy without further interruption if a successful intervention is not achieved then a tunneled catheter will be placed. Dialysis has already been arranged. 3.  Hypertension:  Patient will continue medical management; nephrology is following no changes in oral medications. 4. Diabetes mellitus:  Glucose will be monitored and oral medications been held this morning once the patient has undergone the patient's procedure po intake will be reinitiated and again Accu-Cheks will be used to assess the blood glucose level and treat as needed. The patient will be restarted on the patient's usual hypoglycemic regime     Festus Barren, MD  09/02/2022 12:59 PM

## 2022-09-04 DIAGNOSIS — Z992 Dependence on renal dialysis: Secondary | ICD-10-CM | POA: Diagnosis not present

## 2022-09-04 DIAGNOSIS — N186 End stage renal disease: Secondary | ICD-10-CM | POA: Diagnosis not present

## 2022-09-04 DIAGNOSIS — N2581 Secondary hyperparathyroidism of renal origin: Secondary | ICD-10-CM | POA: Diagnosis not present

## 2022-09-07 DIAGNOSIS — N186 End stage renal disease: Secondary | ICD-10-CM | POA: Diagnosis not present

## 2022-09-07 DIAGNOSIS — N2581 Secondary hyperparathyroidism of renal origin: Secondary | ICD-10-CM | POA: Diagnosis not present

## 2022-09-07 DIAGNOSIS — Z992 Dependence on renal dialysis: Secondary | ICD-10-CM | POA: Diagnosis not present

## 2022-09-09 DIAGNOSIS — N186 End stage renal disease: Secondary | ICD-10-CM | POA: Diagnosis not present

## 2022-09-09 DIAGNOSIS — Z992 Dependence on renal dialysis: Secondary | ICD-10-CM | POA: Diagnosis not present

## 2022-09-09 DIAGNOSIS — N2581 Secondary hyperparathyroidism of renal origin: Secondary | ICD-10-CM | POA: Diagnosis not present

## 2022-09-10 ENCOUNTER — Telehealth: Payer: Self-pay | Admitting: Nurse Practitioner

## 2022-09-10 ENCOUNTER — Encounter: Payer: Self-pay | Admitting: Internal Medicine

## 2022-09-10 ENCOUNTER — Ambulatory Visit: Payer: Medicare HMO | Attending: Internal Medicine | Admitting: Internal Medicine

## 2022-09-10 DIAGNOSIS — N186 End stage renal disease: Secondary | ICD-10-CM

## 2022-09-10 NOTE — Progress Notes (Signed)
No show

## 2022-09-10 NOTE — Telephone Encounter (Signed)
Copied from CRM [id number]. Topic: General - Other >> Sep 10, 2022 12:37 PM Carrielelia G wrote: Ms. Alinda Money runs an Geophysicist/field seismologist living.  Mr Broce is moving in this saturday and needs a hospital bed, toilet and a rolator/Wheelchair  and a urine cup. (His right leg has been amputated) Ms. Alinda Money does not want her address given to patients daughter. Please call and advise. Thank you >> Sep 10, 2022  1:16 PM Dondra Prader E wrote: Requesting a call back to find the manufacturer of the last hospital bed that the patient used.  Best contact: [phone] Langley Gauss)

## 2022-09-11 DIAGNOSIS — Z992 Dependence on renal dialysis: Secondary | ICD-10-CM | POA: Diagnosis not present

## 2022-09-11 DIAGNOSIS — N186 End stage renal disease: Secondary | ICD-10-CM | POA: Diagnosis not present

## 2022-09-11 DIAGNOSIS — N2581 Secondary hyperparathyroidism of renal origin: Secondary | ICD-10-CM | POA: Diagnosis not present

## 2022-09-11 NOTE — Telephone Encounter (Signed)
The niece called back in once again to provide the correct address for all the supplies to be sent to. That address is  [address] [place], [place] [address]

## 2022-09-11 NOTE — Telephone Encounter (Signed)
Pt is calling in checking on the status of his bed and other items. Pt is moving this Saturday and per pt's niece Angelique Blonder, pt has been sleeping on an air mattress which isn't good for his leg that was amputated. Please advise.

## 2022-09-11 NOTE — Telephone Encounter (Signed)
Do orders need to be sent in for the patient?

## 2022-09-13 DIAGNOSIS — N186 End stage renal disease: Secondary | ICD-10-CM | POA: Diagnosis not present

## 2022-09-13 DIAGNOSIS — Z992 Dependence on renal dialysis: Secondary | ICD-10-CM | POA: Diagnosis not present

## 2022-09-13 DIAGNOSIS — I120 Hypertensive chronic kidney disease with stage 5 chronic kidney disease or end stage renal disease: Secondary | ICD-10-CM | POA: Diagnosis not present

## 2022-09-14 DIAGNOSIS — N2581 Secondary hyperparathyroidism of renal origin: Secondary | ICD-10-CM | POA: Diagnosis not present

## 2022-09-14 DIAGNOSIS — N186 End stage renal disease: Secondary | ICD-10-CM | POA: Diagnosis not present

## 2022-09-14 DIAGNOSIS — Z992 Dependence on renal dialysis: Secondary | ICD-10-CM | POA: Diagnosis not present

## 2022-09-14 MED ORDER — NITRILE EXAM GLOVES LARGE MISC: 12 refills | Status: DC

## 2022-09-14 NOTE — Addendum Note (Signed)
Addended by: Aura Dials T on: 09/14/2022 05:06 PM   Modules accepted: Orders

## 2022-09-14 NOTE — Telephone Encounter (Signed)
Called and spoke to patient's niece. I advised her that the orders for a plastic urinal, rollator walker, hospital bed, large gloves, and wheelchair have been ordered by Jolene. Patient's niece states that the patient does not need a wheelchair, but does need a bed side commode. Patient's niece states that she does not know where she would like orders sent. I advised her that I would send orders to a company called [employer] once all orders were completed and chart was updated. Patient's niece also asked if all of the patient's medications could be sent in to [facility] on [address] so the patient's daughter will not try to pick up the patient's medications. Patient also asked to have the daughter removed from his chart as he does not want her to have any of his information. I advised the patient that he would have to come in and complete a new designated party release to remove his daughter and add his niece. Patient's nice also wanted to schedule a follow up appointment with Jolene, which we did.   Admin staff- can we please update the patient's address to [address], [place], [place] [address]  Corrie Dandy- can we do an order for the bedside commode and send prescriptions to [facility] as requested? I have updated the pharmacy in the chart.   Once updates to the patient's information and orders have been written, I will fax them in for the patient.

## 2022-09-14 NOTE — Addendum Note (Signed)
Addended by: Aura Dials T on: 09/14/2022 01:22 PM   Modules accepted: Orders

## 2022-09-14 NOTE — Telephone Encounter (Signed)
Patient's address has been updated.

## 2022-09-14 NOTE — Telephone Encounter (Signed)
patients niece is calling to check on the status of orders. Patient is sleeping on the floor with a low air mattress which is not good for the leg that was amputated. Also requesting Large gloves to be used with bathing the patient. Please advise [phone]

## 2022-09-15 ENCOUNTER — Other Ambulatory Visit: Payer: Self-pay | Admitting: Nurse Practitioner

## 2022-09-15 NOTE — Telephone Encounter (Signed)
Medication Refill - Medication: HYDROcodone-acetaminophen (NORCO) 5-325 MG tablet Patient has 6 left*  Patient has been taking 2 of these pills twice daily & his daughter, Adam Howell, states that he is going out of town tomorrow for a month & a half and needs a refill on these.  Has the patient contacted their pharmacy? Yes. Patient was to contact PCP  Preferred Pharmacy (with phone number or street name): [id number] - [place] ([place]), [place] - [address] [[id number]]   Has the patient been seen for an appointment in the last year OR does the patient have an upcoming appointment? Yes.    Patients daughter Adam Howell #; [id number]

## 2022-09-15 NOTE — Telephone Encounter (Signed)
All orders, demographics, and last office visit note has been faxed in to [facility] in [place]. They are an [employer].

## 2022-09-15 NOTE — Addendum Note (Signed)
Addended by: Aura Dials T on: 09/15/2022 12:10 PM   Modules accepted: Orders

## 2022-09-16 ENCOUNTER — Telehealth: Payer: Self-pay | Admitting: Nurse Practitioner

## 2022-09-16 ENCOUNTER — Encounter: Payer: Self-pay | Admitting: *Deleted

## 2022-09-16 DIAGNOSIS — N186 End stage renal disease: Secondary | ICD-10-CM | POA: Diagnosis not present

## 2022-09-16 DIAGNOSIS — N2581 Secondary hyperparathyroidism of renal origin: Secondary | ICD-10-CM | POA: Diagnosis not present

## 2022-09-16 DIAGNOSIS — Z992 Dependence on renal dialysis: Secondary | ICD-10-CM | POA: Diagnosis not present

## 2022-09-16 NOTE — Telephone Encounter (Signed)
Adam Howell with [facility] is calling in because she says they received a prescription for Nitros Disposable Exam Gloves and says they don't carry that type of glove and the prescription would need to be sent elsewhere.

## 2022-09-16 NOTE — Telephone Encounter (Signed)
Patient daughter called back and NT reviewed message from J. Cannady, NP from 09/16/22. Patient's sister [name] on [place] reports patient is used to taking hydrocodone - acetominophen 10- 325 mg every 8 hours and now ordered 5- 325 mg and has been taking 2 tablets prior to HD and then 2 tablets after HD for pain. Patient has not been to pain clinic at this time and is to go after coming back from vacation. Patient's sister requesting refill early due to going on vacation later today. Please advise can call #[phone].

## 2022-09-16 NOTE — Telephone Encounter (Signed)
Requested medication (s) are due for refill today - 1 week  Requested medication (s) are on the active medication list -yes  Future visit scheduled -yes  Last refill: 08/24/22 #90  Notes to clinic: non delegated Rx  Requested Prescriptions  Pending Prescriptions Disp Refills   HYDROcodone-acetaminophen (NORCO) 5-325 MG tablet 90 tablet 0    Sig: Take 1 tablet by mouth every 8 (eight) hours as needed for moderate pain.     Not Delegated - Analgesics:  Opioid Agonist Combinations Failed - 09/15/2022  2:10 PM      Failed - This refill cannot be delegated      Failed - Urine Drug Screen completed in last 360 days      Passed - Valid encounter within last 3 months    Recent Outpatient Visits           1 month ago Type 2 diabetes mellitus with ESRD (end-stage renal disease) (HCC)   [place] [facility] [place], Jolene T, NP   4 months ago Type 2 diabetes mellitus with ESRD (end-stage renal disease) (HCC)   [place] [facility] [place], [place] T, NP   5 months ago S/P AKA (above knee amputation), right (HCC)   [place] [facility] [place], Megan P, DO   6 months ago Chronic pain syndrome   [place] [facility] [place], [place] T, NP   7 months ago Diabetic ulcer of right lower leg (HCC)   [place] [facility] [place], Dorie Rank, NP       Future Appointments             In 2 weeks Cannady, Dorie Rank, NP [place] [facility]   In 1 month [place], [place] T, NP [place] [facility]   In 2 months End, Cristal Deer, MD [facility]               Requested Prescriptions  Pending Prescriptions Disp Refills   HYDROcodone-acetaminophen (NORCO) 5-325 MG tablet 90 tablet 0    Sig: Take 1 tablet by mouth every 8 (eight) hours as needed for moderate pain.     Not Delegated - Analgesics:  Opioid Agonist Combinations Failed - 09/15/2022  2:10 PM       Failed - This refill cannot be delegated      Failed - Urine Drug Screen completed in last 360 days      Passed - Valid encounter within last 3 months    Recent Outpatient Visits           1 month ago Type 2 diabetes mellitus with ESRD (end-stage renal disease) (HCC)   [place] [facility] [place], Jolene T, NP   4 months ago Type 2 diabetes mellitus with ESRD (end-stage renal disease) (HCC)   [place] [facility] [place], Jolene T, NP   5 months ago S/P AKA (above knee amputation), right (HCC)   [place] [facility] [place], Megan P, DO   6 months ago Chronic pain syndrome   [place] [facility] [place], [place] T, NP   7 months ago Diabetic ulcer of right lower leg (HCC)   [place] [facility] [place], Dorie Rank, NP       Future Appointments             In 2 weeks Cannady, Dorie Rank, NP [place] [facility]   In  1 month Cannady, Dorie Rank, NP [place] [facility]   In 2 months End, Cristal Deer, MD [facility]

## 2022-09-16 NOTE — Telephone Encounter (Signed)
This encounter was created in error - please disregard.

## 2022-09-18 DIAGNOSIS — N186 End stage renal disease: Secondary | ICD-10-CM | POA: Diagnosis not present

## 2022-09-18 DIAGNOSIS — N2581 Secondary hyperparathyroidism of renal origin: Secondary | ICD-10-CM | POA: Diagnosis not present

## 2022-09-18 DIAGNOSIS — Z992 Dependence on renal dialysis: Secondary | ICD-10-CM | POA: Diagnosis not present

## 2022-09-21 DIAGNOSIS — Z992 Dependence on renal dialysis: Secondary | ICD-10-CM | POA: Diagnosis not present

## 2022-09-21 DIAGNOSIS — N186 End stage renal disease: Secondary | ICD-10-CM | POA: Diagnosis not present

## 2022-09-21 DIAGNOSIS — N2581 Secondary hyperparathyroidism of renal origin: Secondary | ICD-10-CM | POA: Diagnosis not present

## 2022-09-21 NOTE — Telephone Encounter (Signed)
Pt niece Langley Gauss is calling to f/u on orders advised per notes below. Orders have been faxed on 07/02 to [facility] in [place].

## 2022-09-21 NOTE — Telephone Encounter (Signed)
Unable to return call, caller did not leave ext. In which to talk to them.

## 2022-09-22 ENCOUNTER — Telehealth: Payer: Self-pay | Admitting: Nurse Practitioner

## 2022-09-22 NOTE — Telephone Encounter (Signed)
Patient daughter Scharlene Gloss came into office requesting a refill for hydrocodone to be sent to [facility] on [address]. Informed her I would send a message back to provider Aura Dials, NP and to allow 48-72 hours for provider to respond. Daughter wants a call when completed at [phone]

## 2022-09-23 DIAGNOSIS — Z992 Dependence on renal dialysis: Secondary | ICD-10-CM | POA: Diagnosis not present

## 2022-09-23 DIAGNOSIS — N2581 Secondary hyperparathyroidism of renal origin: Secondary | ICD-10-CM | POA: Diagnosis not present

## 2022-09-23 DIAGNOSIS — N186 End stage renal disease: Secondary | ICD-10-CM | POA: Diagnosis not present

## 2022-09-23 NOTE — Telephone Encounter (Signed)
Called to to inform patient that he has an appointment scheduled for a medication refill. Daughter [name] states that she requested medication refill before he left the state and was not sure what he was going to do because he was not here. Confirmed how to contact patient to get a personal request for refill per provider number listed is not a working number.

## 2022-09-25 DIAGNOSIS — N186 End stage renal disease: Secondary | ICD-10-CM | POA: Diagnosis not present

## 2022-09-25 DIAGNOSIS — Z992 Dependence on renal dialysis: Secondary | ICD-10-CM | POA: Diagnosis not present

## 2022-09-25 DIAGNOSIS — N2581 Secondary hyperparathyroidism of renal origin: Secondary | ICD-10-CM | POA: Diagnosis not present

## 2022-09-28 DIAGNOSIS — N186 End stage renal disease: Secondary | ICD-10-CM | POA: Diagnosis not present

## 2022-09-28 DIAGNOSIS — Z992 Dependence on renal dialysis: Secondary | ICD-10-CM | POA: Diagnosis not present

## 2022-09-28 DIAGNOSIS — N2581 Secondary hyperparathyroidism of renal origin: Secondary | ICD-10-CM | POA: Diagnosis not present

## 2022-09-30 ENCOUNTER — Ambulatory Visit: Payer: Medicare HMO | Admitting: Nurse Practitioner

## 2022-09-30 DIAGNOSIS — N186 End stage renal disease: Secondary | ICD-10-CM | POA: Diagnosis not present

## 2022-09-30 DIAGNOSIS — Z992 Dependence on renal dialysis: Secondary | ICD-10-CM | POA: Diagnosis not present

## 2022-09-30 DIAGNOSIS — N2581 Secondary hyperparathyroidism of renal origin: Secondary | ICD-10-CM | POA: Diagnosis not present

## 2022-10-02 DIAGNOSIS — N186 End stage renal disease: Secondary | ICD-10-CM | POA: Diagnosis not present

## 2022-10-02 DIAGNOSIS — Z992 Dependence on renal dialysis: Secondary | ICD-10-CM | POA: Diagnosis not present

## 2022-10-02 DIAGNOSIS — N2581 Secondary hyperparathyroidism of renal origin: Secondary | ICD-10-CM | POA: Diagnosis not present

## 2022-10-05 DIAGNOSIS — N2581 Secondary hyperparathyroidism of renal origin: Secondary | ICD-10-CM | POA: Diagnosis not present

## 2022-10-05 DIAGNOSIS — Z992 Dependence on renal dialysis: Secondary | ICD-10-CM | POA: Diagnosis not present

## 2022-10-05 DIAGNOSIS — N186 End stage renal disease: Secondary | ICD-10-CM | POA: Diagnosis not present

## 2022-10-07 DIAGNOSIS — N2581 Secondary hyperparathyroidism of renal origin: Secondary | ICD-10-CM | POA: Diagnosis not present

## 2022-10-07 DIAGNOSIS — Z992 Dependence on renal dialysis: Secondary | ICD-10-CM | POA: Diagnosis not present

## 2022-10-07 DIAGNOSIS — N186 End stage renal disease: Secondary | ICD-10-CM | POA: Diagnosis not present

## 2022-10-09 DIAGNOSIS — Z992 Dependence on renal dialysis: Secondary | ICD-10-CM | POA: Diagnosis not present

## 2022-10-09 DIAGNOSIS — N186 End stage renal disease: Secondary | ICD-10-CM | POA: Diagnosis not present

## 2022-10-09 DIAGNOSIS — N2581 Secondary hyperparathyroidism of renal origin: Secondary | ICD-10-CM | POA: Diagnosis not present

## 2022-10-11 NOTE — Patient Instructions (Signed)
Heart Failure Eating Plan Heart failure, also called congestive heart failure, occurs when your heart does not pump blood well enough to meet your body's needs for oxygen-rich blood. Heart failure is a long-term (chronic) condition. Living with heart failure can be challenging. Following your health care provider's instructions about a healthy lifestyle and working with a dietitian to choose the right foods may help to improve your symptoms. An eating plan for someone with heart failure will include changes that limit the intake of salt (sodium) and unhealthy fat. What are tips for following this plan? Reading food labels Check food labels for the amount of sodium per serving. Choose foods that have less than 140 mg (milligrams) of sodium in each serving. Check food labels for the number of calories per serving. This is important if you need to limit your daily calorie intake to lose weight. Check food labels for the serving size. If you eat more than one serving, you will be eating more sodium and calories than what is listed on the label. Look for foods that are labeled as "sodium-free," "very low sodium," or "low sodium." Foods labeled as "reduced sodium" or "lightly salted" may still have more sodium than what is recommended for you. Cooking Avoid adding salt when cooking. Ask your health care provider or dietitian before using salt substitutes. Season food with salt-free seasonings, spices, or herbs. Check the label of seasoning mixes to make sure they do not contain salt. Cook with heart-healthy oils, such as olive, canola, soybean, or sunflower oil. Do not fry foods. Cook foods using low-fat methods, such as baking, boiling, grilling, and broiling. Limit unhealthy fats when cooking by: Removing the skin from poultry, such as chicken. Removing all visible fats from meats. Skimming the fat off from stews, soups, and gravies before serving them. Meal planning  Limit your intake  of: Processed, canned, or prepackaged foods. Foods that are high in trans fat, such as fried foods. Sweets, desserts, sugary drinks, and other foods with added sugar. Full-fat dairy products, such as whole milk. Eat a balanced diet. This may include: 4-5 servings of fruit each day and 4-5 servings of vegetables each day. At each meal, try to fill one-half of your plate with fruits and vegetables. Up to 6-8 servings of whole grains each day. Up to 2 servings of lean meat, poultry, or fish each day. One serving of meat is equal to 3 oz (85 g). This is about the same size as a deck of cards. 2 servings of low-fat dairy each day. Heart-healthy fats. Healthy fats called omega-3 fatty acids are found in foods such as flaxseed and cold-water fish like sardines, salmon, and mackerel. Aim to eat 25-35 g (grams) of fiber a day. Foods that are high in fiber include apples, broccoli, carrots, beans, peas, and whole grains. Do not add salt or condiments that contain salt (such as soy sauce) to foods before eating. When eating at a restaurant, ask that your food be prepared with less salt or no salt, if possible. Try to eat 2 or more vegetarian meals each week. Eat more home-cooked food and eat less restaurant, buffet, and fast food. General information Do not eat more than 2,300 mg of sodium a day. The amount of sodium that is recommended for you may be lower, depending on your condition. Maintain a healthy body weight as directed. Ask your health care provider what a healthy weight is for you. Check your weight every day. Work with your health care provider   and dietitian to make a plan that is right for you to lose weight or maintain your current weight. Limit how much fluid you drink. Ask your health care provider or dietitian how much fluid you can have each day. Limit or avoid alcohol as told by your health care provider or dietitian. Recommended foods Fruits All fresh, frozen, and canned fruits.  Dried fruits, such as raisins, prunes, and cranberries. Vegetables All fresh vegetables. Vegetables that are frozen without sauce or added salt. Low-sodium or sodium-free canned vegetables. Grains Bread with less than 80 mg of sodium per slice. Whole-wheat pasta, quinoa, and brown rice. Oats and oatmeal. Barley. Millet. Grits and cream of wheat. Whole-grain and whole-wheat cold cereal. Meats and other protein foods Lean cuts of meat. Skinless chicken and turkey. Fish with high omega-3 fatty acids, such as salmon, sardines, and other cold-water fishes. Eggs. Dried beans, peas, and edamame. Unsalted nuts and nut butters. Dairy Low-fat or nonfat (skim) milk and dried milk. Rice milk, soy milk, and almond milk. Low-fat or nonfat yogurt. Small amounts of reduced-sodium block cheese. Low-sodium cottage cheese. Fats and oils Olive, canola, soybean, flaxseed, avocado, or sunflower oil. Sweets and desserts Applesauce. Granola bars. Sugar-free pudding and gelatin. Frozen fruit bars. Seasoning and other foods Fresh and dried herbs. Lemon or lime juice. Vinegar. Low-sodium ketchup. Salt-free marinades, salad dressings, sauces, and seasonings. The items listed above may not be a complete list of foods and beverages you can eat. Contact a dietitian for more information. Foods to avoid Fruits Fruits that are dried with sodium-containing preservatives. Vegetables Canned vegetables. Frozen vegetables with sauce or seasonings. Creamed vegetables. French fries. Onion rings. Pickled vegetables and sauerkraut. Grains Bread with more than 80 mg of sodium per slice. Hot or cold cereal with more than 140 mg sodium per serving. Salted pretzels and crackers. Prepackaged breadcrumbs. Bagels, croissants, and biscuits. Meats and other protein foods Ribs and chicken wings. Bacon, ham, pepperoni, bologna, salami, and packaged luncheon meats. Hot dogs, bratwurst, and sausage. Canned meat. Smoked meat and fish. Salted nuts  and seeds. Dairy Whole milk, half-and-half, and cream. Buttermilk. Processed cheese, cheese spreads, and cheese curds. Regular cottage cheese. Feta cheese. Shredded cheese. String cheese. Fats and oils Butter, lard, shortening, ghee, and bacon fat. Canned and packaged gravies. Seasoning and other foods Onion salt, garlic salt, table salt, and sea salt. Marinades. Regular salad dressings. Relishes, pickles, and olives. Meat flavorings and tenderizers, and bouillon cubes. Horseradish, ketchup, and mustard. Worcestershire sauce. Teriyaki sauce, soy sauce (including reduced sodium). Hot sauce and Tabasco sauce. Steak sauce, fish sauce, oyster sauce, and cocktail sauce. Taco seasonings. Barbecue sauce. Tartar sauce. The items listed above may not be a complete list of foods and beverages you should avoid. Contact a dietitian for more information. Summary A heart failure eating plan includes changes that limit your intake of sodium and unhealthy fat, and it may help you lose weight or maintain a healthy weight. Your health care provider may also recommend limiting how much fluid you drink. Most people with heart failure should eat no more than 2,300 mg of salt (sodium) a day. The amount of sodium that is recommended for you may be lower, depending on your condition. Contact your health care provider or dietitian before making any major changes to your diet. This information is not intended to replace advice given to you by your health care provider. Make sure you discuss any questions you have with your health care provider. Document Revised: 06/10/2021 Document Reviewed: 10/16/2019   Elsevier Patient Education  2024 Elsevier Inc.  

## 2022-10-12 ENCOUNTER — Encounter: Payer: Self-pay | Admitting: Nurse Practitioner

## 2022-10-12 ENCOUNTER — Ambulatory Visit (INDEPENDENT_AMBULATORY_CARE_PROVIDER_SITE_OTHER): Payer: Medicare HMO | Admitting: Nurse Practitioner

## 2022-10-12 VITALS — BP 108/69 | HR 80 | Temp 98.3°F

## 2022-10-12 DIAGNOSIS — I482 Chronic atrial fibrillation, unspecified: Secondary | ICD-10-CM | POA: Diagnosis not present

## 2022-10-12 DIAGNOSIS — G894 Chronic pain syndrome: Secondary | ICD-10-CM | POA: Diagnosis not present

## 2022-10-12 DIAGNOSIS — D631 Anemia in chronic kidney disease: Secondary | ICD-10-CM

## 2022-10-12 DIAGNOSIS — N2581 Secondary hyperparathyroidism of renal origin: Secondary | ICD-10-CM | POA: Diagnosis not present

## 2022-10-12 DIAGNOSIS — Z89611 Acquired absence of right leg above knee: Secondary | ICD-10-CM | POA: Diagnosis not present

## 2022-10-12 DIAGNOSIS — E1169 Type 2 diabetes mellitus with other specified complication: Secondary | ICD-10-CM | POA: Diagnosis not present

## 2022-10-12 DIAGNOSIS — I132 Hypertensive heart and chronic kidney disease with heart failure and with stage 5 chronic kidney disease, or end stage renal disease: Secondary | ICD-10-CM

## 2022-10-12 DIAGNOSIS — E669 Obesity, unspecified: Secondary | ICD-10-CM

## 2022-10-12 DIAGNOSIS — F5101 Primary insomnia: Secondary | ICD-10-CM | POA: Diagnosis not present

## 2022-10-12 DIAGNOSIS — Z992 Dependence on renal dialysis: Secondary | ICD-10-CM

## 2022-10-12 DIAGNOSIS — I5020 Unspecified systolic (congestive) heart failure: Secondary | ICD-10-CM

## 2022-10-12 DIAGNOSIS — E1122 Type 2 diabetes mellitus with diabetic chronic kidney disease: Secondary | ICD-10-CM

## 2022-10-12 DIAGNOSIS — E785 Hyperlipidemia, unspecified: Secondary | ICD-10-CM

## 2022-10-12 DIAGNOSIS — N186 End stage renal disease: Secondary | ICD-10-CM

## 2022-10-12 LAB — BAYER DCA HB A1C WAIVED: HB A1C (BAYER DCA - WAIVED): 5.9 % — ABNORMAL HIGH (ref 4.8–5.6)

## 2022-10-12 MED ORDER — PREGABALIN 25 MG PO CAPS: 25.0000 mg | ORAL_CAPSULE | Freq: Every day | ORAL | 2 refills | Status: DC

## 2022-10-12 MED ORDER — ELIQUIS 2.5 MG PO TABS: 2.5000 mg | ORAL_TABLET | Freq: Two times a day (BID) | ORAL | 4 refills | Status: DC

## 2022-10-12 MED ORDER — ROSUVASTATIN CALCIUM 40 MG PO TABS: 40.0000 mg | ORAL_TABLET | Freq: Every day | ORAL | 4 refills | Status: DC

## 2022-10-12 MED ORDER — AMIODARONE HCL 200 MG PO TABS: 200.0000 mg | ORAL_TABLET | Freq: Two times a day (BID) | ORAL | 4 refills | Status: DC

## 2022-10-12 MED ORDER — TRAZODONE HCL 50 MG PO TABS: 50.0000 mg | ORAL_TABLET | Freq: Every day | ORAL | 4 refills | Status: DC

## 2022-10-12 MED ORDER — HYDROCODONE-ACETAMINOPHEN 5-325 MG PO TABS: 1.0000 | ORAL_TABLET | Freq: Two times a day (BID) | ORAL | 0 refills | Status: DC | PRN

## 2022-10-12 NOTE — Assessment & Plan Note (Signed)
Performed 03/13/22.  Continue collaboration with vascular and monitor skin status closely.

## 2022-10-12 NOTE — Progress Notes (Signed)
BP 108/69   Pulse 80   Temp 98.3 F (36.8 C) (Oral)   SpO2 99%    Subjective:    [name], male    DOB: 09/21/1960, 62 y.o.   MRN: [id number]  HPI: Adam Howell is a 62 y.o. male  Chief Complaint  Patient presents with   Foot Pain    Left foot pain for past week or so   FL2 phone #[phone], Joni Reining  DIABETES A1c 5.9% May.  Range since August 2020 -- 4.7 to 6.8%.  Not taking any diabetes medicine, previously took Trulicity and insulin.    Had above knee amputation to right leg on 03/13/22, overall healing well and awaiting prosthetic. Saw vascular last 07/21/2022 who is assisting with this.  Reports left foot pain -- burning discomfort or aching -- chronic, all the time.  Had foot exam at dialysis today with no wounds.  Has been unable to ambulate well since the amputation.  We ordered wheel chair and hospital bed last visit.  He did not receive yet.  He is getting prosthetic soon and will do PT with this.  Norco last fill 08/24/22 for 30 day supply.  He is agreeable to slow dose reduction on this -- to return to only before dialysis as was previously.  He is now living with his niece, which he reports as better environment.  Prior was living with daughter and she was taking his pain medication he reports.  Has been unable to ambulate well since the amputation.  Patient can transfer independently.  He would benefit from a manual w/c and walker assistance, as he enjoys being active to his highest level and this would increase his current independence.  He is dependent on manual w/c and walker to assist with ADLs.  He has control of upper body and is able to maneuver a manual w/c/walker + cognitively understands how to safely use these devices.  Without a manual w/c/walker his MRADLs would be significantly affected, as he would be unable to transfer to areas in her home to cook and bath + would not be able to attend provider appointments to assess his overall  health.   He is also in need of hospital bed as is currently sleeping on floor at niece's house.  Hypoglycemic episodes:yes Polydipsia/polyuria: no Visual disturbance: no Chest pain: no Paresthesias: no Glucose Monitoring: yes             Accucheck frequency: not checking             Fasting glucose:              Post prandial:             Evening:             Before meals: Taking Insulin?: yes             Long acting insulin:              Short acting insulin:  Blood Pressure Monitoring: not checking Retinal Examination: Not up to Date Foot Exam: Up To Date Pneumovax: Up to Date  Influenza: Up to Date Aspirin: no    HYPERTENSION / HYPERLIPIDEMIA/HF Taking Eliquis, Amiodarone, Renvela. Takes Crestor daily for HLD.     Saw Dr. Okey Dupre with cardiology on 11/10/19 -- missed follow-up recently  -- Recent EF on 01/10/20 was 35-40%.   Satisfied with current treatment? yes Duration of hypertension: chronic BP monitoring frequency: not checking BP range:  BP medication side effects: no Duration of hyperlipidemia: chronic Cholesterol medication side effects: no Cholesterol supplements: none Medication compliance: good compliance Aspirin: no Recent stressors: no Recurrent headaches: no Visual changes: no Palpitations: no Dyspnea: no Chest pain: no Lower extremity edema: no Dizzy/lightheaded: no    CHRONIC KIDNEY DISEASE End stage renal disease and obtains dialysis.  Goes to dialysis Monday, Wednesday, Friday.  Sees nephrology in clinic, saw last Friday in clinic. CKD status: stable Medications renally dose: yes Previous renal evaluation: yes Pneumovax:  Up to Date Influenza Vaccine:  Up to Date    INSOMNIA Used to take Trazodone.   Duration: chronic Satisfied with sleep quality: yes Difficulty falling asleep: yes Difficulty staying asleep: no Waking a few hours after sleep onset: no Early morning awakenings: no Daytime hypersomnolence: no Wakes feeling refreshed:  yes Good sleep hygiene: yes Apnea: no Snoring: no Depressed/anxious mood: no Recent stress: no Restless legs/nocturnal leg cramps: no Chronic pain/arthritis: no History of sleep study: yes, they reported he needed machine but could not afford Treatments attempted: Ambien   Relevant past medical, surgical, family and social history reviewed and updated as indicated. Interim medical history since our last visit reviewed. Allergies and medications reviewed and updated.  Review of Systems  Constitutional:  Negative for activity change, diaphoresis, fatigue and fever.  Respiratory:  Negative for cough, chest tightness, shortness of breath and wheezing.   Cardiovascular:  Negative for chest pain, palpitations and leg swelling.  Gastrointestinal: Negative.   Endocrine: Negative for polydipsia, polyphagia and polyuria.  Neurological: Negative.   Psychiatric/Behavioral: Negative.      Per HPI unless specifically indicated above     Objective:    BP 108/69   Pulse 80   Temp 98.3 F (36.8 C) (Oral)   SpO2 99%   Wt Readings from Last 3 Encounters:  08/27/22 250 lb (113.4 kg)  05/12/22 232 lb (105.2 kg)  04/04/22 231 lb 7.7 oz (105 kg)    Physical Exam Vitals and nursing note reviewed.  Constitutional:      General: He is awake. He is not in acute distress.    Appearance: He is well-developed and well-groomed. He is obese. He is not ill-appearing or toxic-appearing.  HENT:     Head: Normocephalic and atraumatic.     Right Ear: Hearing normal. No drainage.     Left Ear: Hearing normal. No drainage.  Eyes:     General: Lids are normal.        Right eye: No discharge.        Left eye: No discharge.     Conjunctiva/sclera: Conjunctivae normal.     Pupils: Pupils are equal, round, and reactive to light.  Neck:     Thyroid: No thyromegaly.     Vascular: No carotid bruit.     Trachea: Trachea normal.  Cardiovascular:     Rate and Rhythm: Normal rate and regular rhythm.      Pulses:          Dorsalis pedis pulses are 1+ on the left side.       Posterior tibial pulses are 1+ on the left side.     Heart sounds: Normal heart sounds, S1 normal and S2 normal. No murmur heard.    No gallop.     Arteriovenous access: Left arteriovenous access is present.    Comments: Decreased hair pattern left leg. Pulmonary:     Effort: Pulmonary effort is normal. No accessory muscle usage or respiratory distress.  Breath sounds: Normal breath sounds.  Abdominal:     General: Bowel sounds are normal. There is no distension.     Palpations: Abdomen is soft.     Tenderness: There is no abdominal tenderness.  Musculoskeletal:        General: Normal range of motion.     Cervical back: Normal range of motion and neck supple.     Left lower leg: No edema.     Right Lower Extremity: Right leg is amputated above knee.  Skin:    General: Skin is warm and dry.     Capillary Refill: Capillary refill takes less than 2 seconds.     Findings: No rash.  Neurological:     Mental Status: [name] and oriented to person, place, and time.     Deep Tendon Reflexes: Reflexes are normal and symmetric.  Psychiatric:        Attention and Perception: Attention normal.        Mood and Affect: Mood normal.        Speech: Speech normal.        Behavior: Behavior normal. Behavior is cooperative.        Thought Content: Thought content normal.        Judgment: Judgment normal.     Results for orders placed or performed in visit on 10/12/22  Bayer DCA Hb A1c Waived  Result Value Ref Range   HB A1C (BAYER DCA - WAIVED) 5.9 (H) 4.8 - 5.6 %      Assessment & Plan:   Problem List Items Addressed This Visit       Cardiovascular and Mediastinum   Chronic atrial fibrillation with RVR (HCC)    Chronic, ongoing.  Rate controlled.  Continue current medication regimen.  Continue collaboration with cardiology, recommend he reschedule with them. Last visit August 2021 and missed recent visit.   Discussed at length. Return to office in 3 months.        Relevant Medications   amiodarone (PACERONE) 200 MG tablet   ELIQUIS 2.5 MG TABS tablet   rosuvastatin (CRESTOR) 40 MG tablet   Heart failure with reduced ejection fraction (HCC)    Chronic, stable, euvolemic today.  Continue current medication regimen and adjust as needed.  Continue collaboration with cardiology, recommend he reschedule with them. Last visit August 2021 and missed recent visit.  Discussed at length. - Reminded to call for an overnight weight gain of >2 pounds or a weekly weight weight of >5 pounds - not adding salt to his food and has been reading food labels. Reviewed the importance of keeping daily sodium intake to 2000mg  daily  - Avoid NSAIDS      Relevant Medications   amiodarone (PACERONE) 200 MG tablet   ELIQUIS 2.5 MG TABS tablet   rosuvastatin (CRESTOR) 40 MG tablet   Hypertensive heart and kidney disease with heart failure and chronic kidney disease stage V (HCC)    Chronic, ongoing, followed by dialysis team.  BP well below goal for ESRD in office today, monitor closely.  Continue current medication regimen as prescribed by nephrology.  Attempt to obtain recent notes from [facility].  Labs today: A1c today.  Recommend he monitor BP at home a few days a week and document + focus on DASH diet.  Recent nephrology notes reviewed.  Return to office in 3 months.       Relevant Medications   amiodarone (PACERONE) 200 MG tablet   ELIQUIS 2.5 MG TABS tablet  rosuvastatin (CRESTOR) 40 MG tablet     Endocrine   Hyperlipidemia associated with type 2 diabetes mellitus (HCC)    Chronic, ongoing.  Continue current medication regimen and adjust as needed.  Lipid panel today.         Relevant Medications   amiodarone (PACERONE) 200 MG tablet   ELIQUIS 2.5 MG TABS tablet   rosuvastatin (CRESTOR) 40 MG tablet   Other Relevant Orders   Bayer DCA Hb A1c Waived (Completed)   Secondary hyperparathyroidism of renal  origin (HCC)    Chronic, ongoing with ESRD.  Continue collaboration with dialysis team and nephrology, attempt to obtain recent notes from [facility].      Type 2 diabetes mellitus with ESRD (end-stage renal disease) (HCC) - Primary    Chronic, ongoing with A1c 5.9% today, remaining stable.  Unable to obtain urine for ALB check.  At goal.  Continue off medications at this time.  Recommend he monitor BS at least a couple times a day and document for provider + focus on diabetic diet.  Return to office in 3 months for follow-up.   - Needs to schedule eye exam, recommend he do so. - Foot exam today - Vaccination, refuses some. - Statin on board.  ESRD, no ACE or ARB at this time.      Relevant Medications   rosuvastatin (CRESTOR) 40 MG tablet   Other Relevant Orders   Bayer DCA Hb A1c Waived (Completed)     Genitourinary   Anemia in chronic kidney disease    Chronic, ongoing, followed by dialysis teams.  Continue current medication regimen as prescribed by them.  Attempt to obtain recent notes from [facility].  Return in 3 months.      ESRD on dialysis [facility])    Chronic, ongoing, followed by nephrology and dialysis teams.  Unsure about diet compliance as tends to eat fast food on occasion.  Continue current medication regimen as prescribed, refills sent.  Attempt to obtain recent notes from [facility].  Return to office in 3 months for follow-up.          Other   Chronic pain syndrome    Ongoing, took increased Norco due to recent AKA right side.  Controlled substance contract up to date, he is now living with niece -- prior was living with daughter who was taking his pain medication.  Will refill pain medication during this time for 5-325 Norco due to pain with dressing changes and wound.  We are slowly reducing dose.  Reduce to every 12 hours, goal to return to as needed before dialysis only.  Start Lyrica 25 MG, renal dosed based on CrCl, daily for neuropathy type discomfort -- educated him on  this and he is agreeable to trial and to reduction on Norco.  UDS up to date.      Relevant Medications   traZODone (DESYREL) 50 MG tablet   HYDROcodone-acetaminophen (NORCO) 5-325 MG tablet   pregabalin (LYRICA) 25 MG capsule   Insomnia    Chronic, ongoing.  Continue Trazodone.  Try to avoid return to Ambien unless needed, has controlled subs agreement on chart.  Adjust regimen as needed.  Return in 3 months.      S/P AKA (above knee amputation), right (HCC)    Performed 03/13/22.  Continue collaboration with vascular and monitor skin status closely.        Follow up plan: Return in about 3 months (around 01/12/2023) for T2DM, HTN/HLD, ERSD, PAIN.

## 2022-10-12 NOTE — Assessment & Plan Note (Signed)
Chronic, ongoing, followed by dialysis teams.  Continue current medication regimen as prescribed by them.  Attempt to obtain recent notes from [facility].  Return in 3 months. 

## 2022-10-12 NOTE — Assessment & Plan Note (Signed)
Chronic, ongoing.  Continue current medication regimen and adjust as needed. Lipid panel today. 

## 2022-10-12 NOTE — Assessment & Plan Note (Addendum)
Chronic, ongoing.  Rate controlled.  Continue current medication regimen.  Continue collaboration with cardiology, recommend he reschedule with them. Last visit August 2021 and missed recent visit.  Discussed at length. Return to office in 3 months.

## 2022-10-12 NOTE — Assessment & Plan Note (Signed)
Chronic, ongoing with A1c 5.9% today, remaining stable.  Unable to obtain urine for ALB check.  At goal.  Continue off medications at this time.  Recommend he monitor BS at least a couple times a day and document for provider + focus on diabetic diet.  Return to office in 3 months for follow-up.   - Needs to schedule eye exam, recommend he do so. - Foot exam today - Vaccination, refuses some. - Statin on board.  ESRD, no ACE or ARB at this time.

## 2022-10-12 NOTE — Assessment & Plan Note (Signed)
Chronic, ongoing with ESRD.  Continue collaboration with dialysis team and nephrology, attempt to obtain recent notes from [facility]. 

## 2022-10-12 NOTE — Assessment & Plan Note (Signed)
Chronic, ongoing, followed by dialysis team.  BP well below goal for ESRD in office today, monitor closely.  Continue current medication regimen as prescribed by nephrology.  Attempt to obtain recent notes from [facility].  Labs today: A1c today.  Recommend he monitor BP at home a few days a week and document + focus on DASH diet.  Recent nephrology notes reviewed.  Return to office in 3 months.

## 2022-10-12 NOTE — Assessment & Plan Note (Signed)
Chronic, ongoing.  Continue Trazodone.  Try to avoid return to Ambien unless needed, has controlled subs agreement on chart.  Adjust regimen as needed.  Return in 3 months.

## 2022-10-12 NOTE — Assessment & Plan Note (Signed)
Chronic, stable, euvolemic today.  Continue current medication regimen and adjust as needed.  Continue collaboration with cardiology, recommend he reschedule with them. Last visit August 2021 and missed recent visit.  Discussed at length. - Reminded to call for an overnight weight gain of >2 pounds or a weekly weight weight of >5 pounds - not adding salt to his food and has been reading food labels. Reviewed the importance of keeping daily sodium intake to 2000mg  daily  - Avoid NSAIDS

## 2022-10-12 NOTE — Assessment & Plan Note (Signed)
Chronic, ongoing, followed by nephrology and dialysis teams.  Unsure about diet compliance as tends to eat fast food on occasion.  Continue current medication regimen as prescribed, refills sent.  Attempt to obtain recent notes from [facility].  Return to office in 3 months for follow-up.   

## 2022-10-12 NOTE — Assessment & Plan Note (Addendum)
Ongoing, took increased Norco due to recent AKA right side.  Controlled substance contract up to date, he is now living with niece -- prior was living with daughter who was taking his pain medication.  Will refill pain medication during this time for 5-325 Norco due to pain with dressing changes and wound.  We are slowly reducing dose.  Reduce to every 12 hours, goal to return to as needed before dialysis only.  Start Lyrica 25 MG, renal dosed based on CrCl, daily for neuropathy type discomfort -- educated him on this and he is agreeable to trial and to reduction on Norco.  UDS up to date.

## 2022-10-13 ENCOUNTER — Telehealth: Payer: Self-pay

## 2022-10-13 NOTE — Telephone Encounter (Signed)
-----   Message from Marjie Skiff sent at 10/12/2022  2:53 PM EDT ----- She needs different company for wheelchair and commode + bed orders.  Niece reports the company sent to can no transport it to home.

## 2022-10-13 NOTE — Telephone Encounter (Signed)
Attempted to call patient about paperwork he left yesterday at appt, no answer, left HIPAA compliant vm.

## 2022-10-14 DIAGNOSIS — N186 End stage renal disease: Secondary | ICD-10-CM | POA: Diagnosis not present

## 2022-10-14 DIAGNOSIS — N2581 Secondary hyperparathyroidism of renal origin: Secondary | ICD-10-CM | POA: Diagnosis not present

## 2022-10-14 DIAGNOSIS — I120 Hypertensive chronic kidney disease with stage 5 chronic kidney disease or end stage renal disease: Secondary | ICD-10-CM | POA: Diagnosis not present

## 2022-10-14 DIAGNOSIS — Z992 Dependence on renal dialysis: Secondary | ICD-10-CM | POA: Diagnosis not present

## 2022-10-15 ENCOUNTER — Encounter (INDEPENDENT_AMBULATORY_CARE_PROVIDER_SITE_OTHER): Payer: Medicare HMO

## 2022-10-15 ENCOUNTER — Other Ambulatory Visit (INDEPENDENT_AMBULATORY_CARE_PROVIDER_SITE_OTHER): Payer: Self-pay | Admitting: Vascular Surgery

## 2022-10-15 ENCOUNTER — Ambulatory Visit (INDEPENDENT_AMBULATORY_CARE_PROVIDER_SITE_OTHER): Payer: Medicare HMO | Admitting: Nurse Practitioner

## 2022-10-15 DIAGNOSIS — N186 End stage renal disease: Secondary | ICD-10-CM

## 2022-10-16 DIAGNOSIS — N2581 Secondary hyperparathyroidism of renal origin: Secondary | ICD-10-CM | POA: Diagnosis not present

## 2022-10-16 DIAGNOSIS — Z992 Dependence on renal dialysis: Secondary | ICD-10-CM | POA: Diagnosis not present

## 2022-10-16 DIAGNOSIS — N186 End stage renal disease: Secondary | ICD-10-CM | POA: Diagnosis not present

## 2022-10-19 DIAGNOSIS — N2581 Secondary hyperparathyroidism of renal origin: Secondary | ICD-10-CM | POA: Diagnosis not present

## 2022-10-19 DIAGNOSIS — Z992 Dependence on renal dialysis: Secondary | ICD-10-CM | POA: Diagnosis not present

## 2022-10-19 DIAGNOSIS — N186 End stage renal disease: Secondary | ICD-10-CM | POA: Diagnosis not present

## 2022-10-20 ENCOUNTER — Telehealth: Payer: Self-pay | Admitting: Nurse Practitioner

## 2022-10-20 DIAGNOSIS — N186 End stage renal disease: Secondary | ICD-10-CM

## 2022-10-20 NOTE — Telephone Encounter (Unsigned)
Copied from CRM [id number]. Topic: General - Other >> Oct 20, 2022  1:08 PM Everette C wrote: Reason for CRM: The patient's niece Langley Gauss has called to follow up on a previous rx order request for a hospital bed for a patient   Please contact further at [phone]

## 2022-10-21 NOTE — Telephone Encounter (Unsigned)
Copied from CRM [id number]. Topic: General - Other >> Oct 21, 2022 10:40 AM Phill Myron wrote: Angelique Blonder caregiver of Mr. Vanrossum would like a call back. She is calling because Mr Dimitroff is in need of a hospital bed, toilet and a rolatorand a urine cup.Marland KitchenMarland KitchenPlease advise

## 2022-10-22 ENCOUNTER — Other Ambulatory Visit: Payer: Self-pay | Admitting: Nurse Practitioner

## 2022-10-22 DIAGNOSIS — E1122 Type 2 diabetes mellitus with diabetic chronic kidney disease: Secondary | ICD-10-CM

## 2022-10-22 DIAGNOSIS — Z89611 Acquired absence of right leg above knee: Secondary | ICD-10-CM

## 2022-10-22 NOTE — Telephone Encounter (Signed)
Angelique Blonder called to provide [place] with information on where to send an order for the bed and supplies. [facility]: Phone: [phone]. She stated it's not the same place he got his last bed from and that he just wants a regular mattress to lay down; he does not want a gel mattress. He needs a commode and a walker to get out of the bathroom, gloves size large. Asked if possible for him to get a chair as well.  Requested an update on his FL2 form he brought at his last appointment. Can be mailed to [address] [place], [place] [address]  Please advise.

## 2022-10-22 NOTE — Telephone Encounter (Signed)
Adam Howell is calling back stating that the pt is needing a whole new bed and mattress (regular mattress not a gel mattress)  at his new location of [address] [place], [place] [address] due to the old bed having  bed bugs. Adam Howell is also needing the other supplies for the pt: walker, gloves (size large), chair, and commode.   Adam Howell is wanting the supplies to come from a different place and not [facility].  Adam Howell does not want the pt daughter to know where he is living at.   Adam Howell is also requesting an update on the [facility] form for the pt.    Please call Adam Howell at: [phone]

## 2022-10-22 NOTE — Telephone Encounter (Signed)
Angelique Blonder is calling back wanting to know the original company where the pt received his first bed at. Angelique Blonder states that she is frustrated due to her been trying to get a regular mattress for the pt. Angelique Blonder states pt is still needing a walker, gloves(size large), chair, commode. Please advise.

## 2022-10-22 NOTE — Telephone Encounter (Signed)
Orders were sent to [facility] at the beginning of July for these supplies that are being requested.   Reached out to [facility], [employer], and spoke with [place]. Per Britta Mccreedy, the orders that we sent in July were voided because according to their system, the patient currently has these supplies. Britta Mccreedy went back in the patient's record and the patient was delivered a bed to [address] in [place]. Bed was then moved to [address] in [place] and the bed is still their according to their records. Britta Mccreedy states that the bed was not picked up nor moved by them due to a report of bed bugs at the first location. Patient was living with his daughter at the time, at the [address] and [employer] locations. Britta Mccreedy states that they will not pick up the bed until is has been treated to rid the bed bugs.   Called and spoke with the patient's niece. She states that she is aware of all of the above and does not want the current bed that was provided by [facility]. Patient is currently under the niece's care and patient states that he does not want his daughter to know where he is living. Niece states that they are wanting a bed and supplies from a totally different company than [employer].   Routing to provider to see about getting social work involved with this issue and supply needs for the patient.

## 2022-10-23 ENCOUNTER — Telehealth: Payer: Self-pay | Admitting: *Deleted

## 2022-10-23 DIAGNOSIS — N186 End stage renal disease: Secondary | ICD-10-CM | POA: Diagnosis not present

## 2022-10-23 DIAGNOSIS — Z992 Dependence on renal dialysis: Secondary | ICD-10-CM | POA: Diagnosis not present

## 2022-10-23 DIAGNOSIS — N2581 Secondary hyperparathyroidism of renal origin: Secondary | ICD-10-CM | POA: Diagnosis not present

## 2022-10-23 NOTE — Progress Notes (Signed)
  Care Coordination   Note   10/23/2022 Name: Adam Howell MRN: [id number] DOB: 03/27/1960  Adam Howell is a 62 y.o. year old male who sees Aura Dials T, NP for primary care. I reached out to Isac Sarna by phone today to offer care coordination services.  [name] was given information about Care Coordination services today including:   The Care Coordination services include support from the care team which includes your Nurse Coordinator, Clinical Social Worker, or Pharmacist.  The Care Coordination team is here to help remove barriers to the health concerns and goals most important to you. Care Coordination services are voluntary, and the patient may decline or stop services at any time by request to their care team member.   Care Coordination Consent Status: Patient agreed to services and verbal consent obtained.   Follow up plan:  Telephone appointment with care coordination team member scheduled for:  10/26/2022  Encounter Outcome:  Pt. Scheduled from referral   Burman Nieves, [facility] Care Coordination Care Guide Direct Dial: [phone]

## 2022-10-23 NOTE — Telephone Encounter (Addendum)
Angelique Blonder, patient's niece has called back in regards to stating that they have everything situated with patient's bed now but they still need a prescription for walker, gloves, chair & commode. Per patients niece, stated Alphonzo Lemmings, from [employer], said for PCP to go ahead and send prescriptions over.   Also requesting update on FL2 form. Please update patient's niece and document status of forms.    Patients niece callback #: : [phone]

## 2022-10-26 ENCOUNTER — Ambulatory Visit: Payer: Self-pay

## 2022-10-26 DIAGNOSIS — Z992 Dependence on renal dialysis: Secondary | ICD-10-CM | POA: Diagnosis not present

## 2022-10-26 DIAGNOSIS — N186 End stage renal disease: Secondary | ICD-10-CM | POA: Diagnosis not present

## 2022-10-26 DIAGNOSIS — N2581 Secondary hyperparathyroidism of renal origin: Secondary | ICD-10-CM | POA: Diagnosis not present

## 2022-10-26 NOTE — Patient Instructions (Signed)
Visit Information  Thank you for taking time to visit with me today. Please don't hesitate to contact me if I can be of assistance to you.   Following are the goals we discussed today:   Goals Addressed             This Visit's Progress    Care Coordination Activities       Care Coordination Interventions: CAP Services-Patients niece Angelique Blonder wants additional assistance in the home to care for patient.  Patient receives Medicaid and has an aide but he needs extra help.  SW conference called CAP to initiate a referral.  Information is provided and an application will be mailed to patient to complete and provide a portion of the paperwork to the doctor to complete.  Patient is informed of the 15 days deadline to return the paperwork.  Patient will select a Case Management provider from the list provided with the paperwork and will contact them for extra support or [place] [facility] [phone] for any additional questions.         Our next appointment is by telephone on 11/13/22 at 10am  Please call the care guide team at [phone] if you need to cancel or reschedule your appointment.   If you are experiencing a Mental Health or Behavioral Health Crisis or need someone to talk to, please call 911  Patient verbalizes understanding of instructions and care plan provided today and agrees to view in MyChart. Active MyChart status and patient understanding of how to access instructions and care plan via MyChart confirmed with patient.     Telephone follow up appointment with care management team member scheduled for:11/13/22 at 10amm.  Lysle Morales, BSW Social Worker [facility] Care Management  [phone]

## 2022-10-26 NOTE — Patient Outreach (Signed)
  Care Coordination   Initial Visit Note   10/26/2022 Name: Robb Bobek MRN: [id number] DOB: 1960/08/09  Damarea Bruce is a 62 y.o. year old male who sees Aura Dials T, NP for primary care. I spoke with  [facility] niece Langley Gauss by phone today.  What matters to the patients health and wellness today?  Patient needs additional personal care support.  Patient is on dialysis, has diabetes and is an amputee.  Patient's niece wants to apply for CAP-DA program.    Goals Addressed             This Visit's Progress    Care Coordination Activities       Care Coordination Interventions: CAP Services-Patients niece Angelique Blonder wants additional assistance in the home to care for patient.  Patient receives Medicaid and has an aide but he needs extra help.  SW conference called CAP to initiate a referral.  Information is provided and an application will be mailed to patient to complete and provide a portion of the paperwork to the doctor to complete.  Patient is informed of the 15 days deadline to return the paperwork.  Patient will select a Case Management provider from the list provided with the paperwork and will contact them for extra support or [place] [facility] [phone] for any additional questions.         SDOH assessments and interventions completed:  Yes  SDOH Interventions Today    Flowsheet Row Most Recent Value  SDOH Interventions   Food Insecurity Interventions Intervention Not Indicated  Housing Interventions Other (Comment)  [Relocated with family and is now stable]  Transportation Interventions Other (Comment)  [Medicaid transportation]  Utilities Interventions Intervention Not Indicated        Care Coordination Interventions:  Yes, provided  Interventions Today    Flowsheet Row Most Recent Value  Chronic Disease   Chronic disease during today's visit Other, Chronic Kidney Disease/End Stage Renal Disease (ESRD), Diabetes  [Recent amputee]   General Interventions   General Interventions Discussed/Reviewed General Interventions Discussed, Communication with  Communication with --  [[place] [facility]-CAP DA Program]        Follow up plan: Follow up call scheduled for 11/13/22 at 10am    Encounter Outcome:  Pt. Visit Completed

## 2022-10-28 DIAGNOSIS — Z992 Dependence on renal dialysis: Secondary | ICD-10-CM | POA: Diagnosis not present

## 2022-10-28 DIAGNOSIS — N2581 Secondary hyperparathyroidism of renal origin: Secondary | ICD-10-CM | POA: Diagnosis not present

## 2022-10-28 DIAGNOSIS — N186 End stage renal disease: Secondary | ICD-10-CM | POA: Diagnosis not present

## 2022-10-28 NOTE — Addendum Note (Signed)
Addended by: Aura Dials T on: 10/28/2022 04:02 PM   Modules accepted: Orders

## 2022-10-28 NOTE — Telephone Encounter (Signed)
Routing to provider. Can we do orders for the requested supplies? Will work on [employer] for the patient.

## 2022-10-30 ENCOUNTER — Telehealth: Payer: Self-pay

## 2022-10-30 DIAGNOSIS — Z992 Dependence on renal dialysis: Secondary | ICD-10-CM | POA: Diagnosis not present

## 2022-10-30 DIAGNOSIS — N186 End stage renal disease: Secondary | ICD-10-CM | POA: Diagnosis not present

## 2022-10-30 DIAGNOSIS — N2581 Secondary hyperparathyroidism of renal origin: Secondary | ICD-10-CM | POA: Diagnosis not present

## 2022-10-30 NOTE — Telephone Encounter (Signed)
Called and spoke to [place]. She states that she needs large gloves and the chair being requested is a lift chair.   Denise requests that the [facility] form be mailed to her.

## 2022-10-30 NOTE — Addendum Note (Signed)
Addended by: Aura Dials T on: 10/30/2022 02:51 PM   Modules accepted: Orders

## 2022-10-30 NOTE — Telephone Encounter (Signed)
Error

## 2022-10-30 NOTE — Telephone Encounter (Signed)
All orders faxed to [facility]. Called to [place] and let her know that all orders have been faxed.

## 2022-10-30 NOTE — Telephone Encounter (Signed)
Copy of FL2 placed in scan bin, original mailed to the patient.

## 2022-11-02 DIAGNOSIS — N186 End stage renal disease: Secondary | ICD-10-CM | POA: Diagnosis not present

## 2022-11-02 DIAGNOSIS — Z992 Dependence on renal dialysis: Secondary | ICD-10-CM | POA: Diagnosis not present

## 2022-11-02 DIAGNOSIS — N2581 Secondary hyperparathyroidism of renal origin: Secondary | ICD-10-CM | POA: Diagnosis not present

## 2022-11-04 DIAGNOSIS — N2581 Secondary hyperparathyroidism of renal origin: Secondary | ICD-10-CM | POA: Diagnosis not present

## 2022-11-04 DIAGNOSIS — Z992 Dependence on renal dialysis: Secondary | ICD-10-CM | POA: Diagnosis not present

## 2022-11-04 DIAGNOSIS — N186 End stage renal disease: Secondary | ICD-10-CM | POA: Diagnosis not present

## 2022-11-06 DIAGNOSIS — N2581 Secondary hyperparathyroidism of renal origin: Secondary | ICD-10-CM | POA: Diagnosis not present

## 2022-11-06 DIAGNOSIS — N186 End stage renal disease: Secondary | ICD-10-CM | POA: Diagnosis not present

## 2022-11-06 DIAGNOSIS — Z992 Dependence on renal dialysis: Secondary | ICD-10-CM | POA: Diagnosis not present

## 2022-11-09 DIAGNOSIS — N2581 Secondary hyperparathyroidism of renal origin: Secondary | ICD-10-CM | POA: Diagnosis not present

## 2022-11-09 DIAGNOSIS — N186 End stage renal disease: Secondary | ICD-10-CM | POA: Diagnosis not present

## 2022-11-09 DIAGNOSIS — Z992 Dependence on renal dialysis: Secondary | ICD-10-CM | POA: Diagnosis not present

## 2022-11-10 ENCOUNTER — Ambulatory Visit: Payer: Medicare HMO | Admitting: Nurse Practitioner

## 2022-11-10 DIAGNOSIS — I482 Chronic atrial fibrillation, unspecified: Secondary | ICD-10-CM

## 2022-11-10 DIAGNOSIS — E1122 Type 2 diabetes mellitus with diabetic chronic kidney disease: Secondary | ICD-10-CM

## 2022-11-10 DIAGNOSIS — N185 Chronic kidney disease, stage 5: Secondary | ICD-10-CM

## 2022-11-10 DIAGNOSIS — E785 Hyperlipidemia, unspecified: Secondary | ICD-10-CM

## 2022-11-10 DIAGNOSIS — D631 Anemia in chronic kidney disease: Secondary | ICD-10-CM

## 2022-11-10 DIAGNOSIS — G894 Chronic pain syndrome: Secondary | ICD-10-CM

## 2022-11-10 DIAGNOSIS — N2581 Secondary hyperparathyroidism of renal origin: Secondary | ICD-10-CM

## 2022-11-10 DIAGNOSIS — I5020 Unspecified systolic (congestive) heart failure: Secondary | ICD-10-CM

## 2022-11-10 DIAGNOSIS — Z89611 Acquired absence of right leg above knee: Secondary | ICD-10-CM

## 2022-11-10 DIAGNOSIS — E669 Obesity, unspecified: Secondary | ICD-10-CM

## 2022-11-10 DIAGNOSIS — D6869 Other thrombophilia: Secondary | ICD-10-CM

## 2022-11-10 DIAGNOSIS — N186 End stage renal disease: Secondary | ICD-10-CM

## 2022-11-11 DIAGNOSIS — N2581 Secondary hyperparathyroidism of renal origin: Secondary | ICD-10-CM | POA: Diagnosis not present

## 2022-11-11 DIAGNOSIS — Z992 Dependence on renal dialysis: Secondary | ICD-10-CM | POA: Diagnosis not present

## 2022-11-11 DIAGNOSIS — N186 End stage renal disease: Secondary | ICD-10-CM | POA: Diagnosis not present

## 2022-11-12 ENCOUNTER — Telehealth: Payer: Self-pay | Admitting: Nurse Practitioner

## 2022-11-12 DIAGNOSIS — Z89611 Acquired absence of right leg above knee: Secondary | ICD-10-CM | POA: Diagnosis not present

## 2022-11-12 NOTE — Telephone Encounter (Signed)
Referral Request - Has patient seen PCP for this complaint? Yes.   *If NO, is insurance requiring patient see PCP for this issue before PCP can refer them? Referral for which specialty: PT Preferred provider/office: ANY Reason for referral: pt got his leg today, and needs to start PT.

## 2022-11-13 ENCOUNTER — Ambulatory Visit: Payer: Self-pay

## 2022-11-13 DIAGNOSIS — N186 End stage renal disease: Secondary | ICD-10-CM | POA: Diagnosis not present

## 2022-11-13 DIAGNOSIS — Z992 Dependence on renal dialysis: Secondary | ICD-10-CM | POA: Diagnosis not present

## 2022-11-13 DIAGNOSIS — N2581 Secondary hyperparathyroidism of renal origin: Secondary | ICD-10-CM | POA: Diagnosis not present

## 2022-11-13 NOTE — Patient Instructions (Signed)
Visit Information  Thank you for taking time to visit with me today. Please don't hesitate to contact me if I can be of assistance to you.   Following are the goals we discussed today:  Patients niece will call back with housing information.   If you are experiencing a Mental Health or Behavioral Health Crisis or need someone to talk to, please call 911  Patient verbalizes understanding of instructions and care plan provided today and agrees to view in MyChart. Active MyChart status and patient understanding of how to access instructions and care plan via MyChart confirmed with patient.     No further follow up required: SW will schedule after speaking to niece.  Lysle Morales, BSW Social Worker [phone]

## 2022-11-14 DIAGNOSIS — Z992 Dependence on renal dialysis: Secondary | ICD-10-CM | POA: Diagnosis not present

## 2022-11-14 DIAGNOSIS — N186 End stage renal disease: Secondary | ICD-10-CM | POA: Diagnosis not present

## 2022-11-14 DIAGNOSIS — I120 Hypertensive chronic kidney disease with stage 5 chronic kidney disease or end stage renal disease: Secondary | ICD-10-CM | POA: Diagnosis not present

## 2022-11-16 DIAGNOSIS — N186 End stage renal disease: Secondary | ICD-10-CM | POA: Diagnosis not present

## 2022-11-16 DIAGNOSIS — Z992 Dependence on renal dialysis: Secondary | ICD-10-CM | POA: Diagnosis not present

## 2022-11-16 DIAGNOSIS — N2581 Secondary hyperparathyroidism of renal origin: Secondary | ICD-10-CM | POA: Diagnosis not present

## 2022-11-17 ENCOUNTER — Telehealth: Payer: Self-pay | Admitting: Nurse Practitioner

## 2022-11-17 DIAGNOSIS — Z89611 Acquired absence of right leg above knee: Secondary | ICD-10-CM

## 2022-11-17 NOTE — Telephone Encounter (Signed)
Pt called asking for hydrocodone 5 mg refill.  I did not see that in his medications.  He is also asking for it to be 10 mg because he is having to take 2 at time.  [facility]  He does not know his new telephone number.

## 2022-11-18 ENCOUNTER — Ambulatory Visit: Payer: Medicare HMO

## 2022-11-18 DIAGNOSIS — Z992 Dependence on renal dialysis: Secondary | ICD-10-CM | POA: Diagnosis not present

## 2022-11-18 DIAGNOSIS — N2581 Secondary hyperparathyroidism of renal origin: Secondary | ICD-10-CM | POA: Diagnosis not present

## 2022-11-18 DIAGNOSIS — N186 End stage renal disease: Secondary | ICD-10-CM | POA: Diagnosis not present

## 2022-11-18 MED ORDER — HYDROCODONE-ACETAMINOPHEN 5-325 MG PO TABS: 1.0000 | ORAL_TABLET | Freq: Two times a day (BID) | ORAL | 0 refills | Status: DC | PRN

## 2022-11-18 NOTE — Telephone Encounter (Signed)
Pt returned call reporting that he wants to increase his dose so he will need to be referred to pain management.

## 2022-11-18 NOTE — Addendum Note (Signed)
Addended by: Aura Dials T on: 11/18/2022 07:02 PM   Modules accepted: Orders

## 2022-11-18 NOTE — Telephone Encounter (Signed)
Copied from CRM [id number]. Topic: General - Other >> Nov 18, 2022  1:29 PM Franchot Heidelberg wrote: Reason for CRM: Pt wants to know if he can have in home ortho care instead of travelling because he lacks transportation.

## 2022-11-18 NOTE — Telephone Encounter (Signed)
Routing to provider to advise.  

## 2022-11-18 NOTE — Addendum Note (Signed)
Addended by: Aura Dials T on: 11/18/2022 07:00 PM   Modules accepted: Orders

## 2022-11-19 NOTE — Telephone Encounter (Signed)
Tried calling to notify patient that referral has been placed and medication has been sent in. No answer and VM box was full so VM could not be left.

## 2022-11-20 DIAGNOSIS — N2581 Secondary hyperparathyroidism of renal origin: Secondary | ICD-10-CM | POA: Diagnosis not present

## 2022-11-20 DIAGNOSIS — N186 End stage renal disease: Secondary | ICD-10-CM | POA: Diagnosis not present

## 2022-11-20 DIAGNOSIS — Z992 Dependence on renal dialysis: Secondary | ICD-10-CM | POA: Diagnosis not present

## 2022-11-22 DIAGNOSIS — E7849 Other hyperlipidemia: Secondary | ICD-10-CM | POA: Diagnosis not present

## 2022-11-22 DIAGNOSIS — E1122 Type 2 diabetes mellitus with diabetic chronic kidney disease: Secondary | ICD-10-CM | POA: Diagnosis not present

## 2022-11-22 DIAGNOSIS — E1169 Type 2 diabetes mellitus with other specified complication: Secondary | ICD-10-CM | POA: Diagnosis not present

## 2022-11-22 DIAGNOSIS — I482 Chronic atrial fibrillation, unspecified: Secondary | ICD-10-CM | POA: Diagnosis not present

## 2022-11-22 DIAGNOSIS — I5022 Chronic systolic (congestive) heart failure: Secondary | ICD-10-CM | POA: Diagnosis not present

## 2022-11-22 DIAGNOSIS — N186 End stage renal disease: Secondary | ICD-10-CM | POA: Diagnosis not present

## 2022-11-22 DIAGNOSIS — D631 Anemia in chronic kidney disease: Secondary | ICD-10-CM | POA: Diagnosis not present

## 2022-11-22 DIAGNOSIS — I132 Hypertensive heart and chronic kidney disease with heart failure and with stage 5 chronic kidney disease, or end stage renal disease: Secondary | ICD-10-CM | POA: Diagnosis not present

## 2022-11-22 DIAGNOSIS — G894 Chronic pain syndrome: Secondary | ICD-10-CM | POA: Diagnosis not present

## 2022-11-23 DIAGNOSIS — Z992 Dependence on renal dialysis: Secondary | ICD-10-CM | POA: Diagnosis not present

## 2022-11-23 DIAGNOSIS — N186 End stage renal disease: Secondary | ICD-10-CM | POA: Diagnosis not present

## 2022-11-23 DIAGNOSIS — N2581 Secondary hyperparathyroidism of renal origin: Secondary | ICD-10-CM | POA: Diagnosis not present

## 2022-11-24 ENCOUNTER — Ambulatory Visit (INDEPENDENT_AMBULATORY_CARE_PROVIDER_SITE_OTHER): Payer: Medicare HMO | Admitting: Podiatry

## 2022-11-24 DIAGNOSIS — M79675 Pain in left toe(s): Secondary | ICD-10-CM | POA: Diagnosis not present

## 2022-11-24 DIAGNOSIS — B351 Tinea unguium: Secondary | ICD-10-CM | POA: Diagnosis not present

## 2022-11-24 NOTE — Progress Notes (Signed)
   No chief complaint on file.   SUBJECTIVE Patient with a history of diabetes mellitus presents to office today complaining of elongated, thickened nails that cause pain while ambulating in shoes.  Patient is unable to trim their own nails. Patient is here for further evaluation and treatment.  Past Medical History:  Diagnosis Date   Chronic kidney disease    Congestive heart failure (HCC)    Diabetes mellitus without complication (HCC)    Difficult intubation    Hyperlipidemia    Hypertension     Allergies  Allergen Reactions   Tramadol Rash     OBJECTIVE General Patient is awake, alert, and oriented x 3 and in no acute distress. Derm Skin is dry and supple bilateral. Negative open lesions or macerations. Remaining integument unremarkable. Nails are tender, long, thickened and dystrophic with subungual debris, consistent with onychomycosis. No signs of infection noted. Vasc US ARTERIAL ABI 01/02/2022 FINDINGS: Right ABI:  1.02 Left ABI:  0.93 Right Lower Extremity:  Normal arterial waveforms at the ankle. Left Lower Extremity:  Normal arterial waveforms at the ankle. 1.0-1.4 Normal IMPRESSION: Normal resting ABIs Neuro diminished via light touch Musculoskeletal Exam history of AKA RLE  ASSESSMENT 1. Diabetes Mellitus w/ peripheral neuropathy 2.  Pain due to onychomycosis of toenails left lower extremity 3.  History of AKA RLE  PLAN OF CARE -Patient evaluated today. -Mechanical debridement of nails 1-5 left performed using a nail nipper without incident or bleeding -Continue management with PCP and vascular -Return to clinic as needed   Felecia Shelling, DPM [facility]  Dr. Felecia Shelling, DPM    [address] [place], [place] [address]                Office [phone]  Fax [phone]

## 2022-11-25 ENCOUNTER — Encounter: Payer: Medicare HMO | Admitting: Physical Therapy

## 2022-11-25 DIAGNOSIS — N186 End stage renal disease: Secondary | ICD-10-CM | POA: Diagnosis not present

## 2022-11-25 DIAGNOSIS — E1169 Type 2 diabetes mellitus with other specified complication: Secondary | ICD-10-CM | POA: Diagnosis not present

## 2022-11-25 DIAGNOSIS — E1122 Type 2 diabetes mellitus with diabetic chronic kidney disease: Secondary | ICD-10-CM | POA: Diagnosis not present

## 2022-11-25 DIAGNOSIS — I132 Hypertensive heart and chronic kidney disease with heart failure and with stage 5 chronic kidney disease, or end stage renal disease: Secondary | ICD-10-CM | POA: Diagnosis not present

## 2022-11-25 DIAGNOSIS — E7849 Other hyperlipidemia: Secondary | ICD-10-CM | POA: Diagnosis not present

## 2022-11-25 DIAGNOSIS — Z992 Dependence on renal dialysis: Secondary | ICD-10-CM | POA: Diagnosis not present

## 2022-11-25 DIAGNOSIS — G894 Chronic pain syndrome: Secondary | ICD-10-CM | POA: Diagnosis not present

## 2022-11-25 DIAGNOSIS — I482 Chronic atrial fibrillation, unspecified: Secondary | ICD-10-CM | POA: Diagnosis not present

## 2022-11-25 DIAGNOSIS — D631 Anemia in chronic kidney disease: Secondary | ICD-10-CM | POA: Diagnosis not present

## 2022-11-25 DIAGNOSIS — N2581 Secondary hyperparathyroidism of renal origin: Secondary | ICD-10-CM | POA: Diagnosis not present

## 2022-11-25 DIAGNOSIS — I5022 Chronic systolic (congestive) heart failure: Secondary | ICD-10-CM | POA: Diagnosis not present

## 2022-11-27 DIAGNOSIS — N2581 Secondary hyperparathyroidism of renal origin: Secondary | ICD-10-CM | POA: Diagnosis not present

## 2022-11-27 DIAGNOSIS — Z992 Dependence on renal dialysis: Secondary | ICD-10-CM | POA: Diagnosis not present

## 2022-11-27 DIAGNOSIS — N186 End stage renal disease: Secondary | ICD-10-CM | POA: Diagnosis not present

## 2022-11-30 DIAGNOSIS — N2581 Secondary hyperparathyroidism of renal origin: Secondary | ICD-10-CM | POA: Diagnosis not present

## 2022-11-30 DIAGNOSIS — Z992 Dependence on renal dialysis: Secondary | ICD-10-CM | POA: Diagnosis not present

## 2022-11-30 DIAGNOSIS — N186 End stage renal disease: Secondary | ICD-10-CM | POA: Diagnosis not present

## 2022-12-01 ENCOUNTER — Ambulatory Visit: Payer: Medicare HMO | Attending: Nurse Practitioner

## 2022-12-01 DIAGNOSIS — E7849 Other hyperlipidemia: Secondary | ICD-10-CM | POA: Diagnosis not present

## 2022-12-01 DIAGNOSIS — N186 End stage renal disease: Secondary | ICD-10-CM | POA: Diagnosis not present

## 2022-12-01 DIAGNOSIS — I5022 Chronic systolic (congestive) heart failure: Secondary | ICD-10-CM | POA: Diagnosis not present

## 2022-12-01 DIAGNOSIS — I132 Hypertensive heart and chronic kidney disease with heart failure and with stage 5 chronic kidney disease, or end stage renal disease: Secondary | ICD-10-CM | POA: Diagnosis not present

## 2022-12-01 DIAGNOSIS — E1122 Type 2 diabetes mellitus with diabetic chronic kidney disease: Secondary | ICD-10-CM | POA: Diagnosis not present

## 2022-12-01 DIAGNOSIS — D631 Anemia in chronic kidney disease: Secondary | ICD-10-CM | POA: Diagnosis not present

## 2022-12-01 DIAGNOSIS — I482 Chronic atrial fibrillation, unspecified: Secondary | ICD-10-CM | POA: Diagnosis not present

## 2022-12-01 DIAGNOSIS — G894 Chronic pain syndrome: Secondary | ICD-10-CM | POA: Diagnosis not present

## 2022-12-01 DIAGNOSIS — E1169 Type 2 diabetes mellitus with other specified complication: Secondary | ICD-10-CM | POA: Diagnosis not present

## 2022-12-02 DIAGNOSIS — N186 End stage renal disease: Secondary | ICD-10-CM | POA: Diagnosis not present

## 2022-12-02 DIAGNOSIS — Z992 Dependence on renal dialysis: Secondary | ICD-10-CM | POA: Diagnosis not present

## 2022-12-02 DIAGNOSIS — N2581 Secondary hyperparathyroidism of renal origin: Secondary | ICD-10-CM | POA: Diagnosis not present

## 2022-12-03 ENCOUNTER — Ambulatory Visit: Payer: Medicare HMO | Admitting: Physical Therapy

## 2022-12-03 ENCOUNTER — Ambulatory Visit: Payer: Medicare HMO | Attending: Internal Medicine | Admitting: Internal Medicine

## 2022-12-04 DIAGNOSIS — Z992 Dependence on renal dialysis: Secondary | ICD-10-CM | POA: Diagnosis not present

## 2022-12-04 DIAGNOSIS — N186 End stage renal disease: Secondary | ICD-10-CM | POA: Diagnosis not present

## 2022-12-04 DIAGNOSIS — N2581 Secondary hyperparathyroidism of renal origin: Secondary | ICD-10-CM | POA: Diagnosis not present

## 2022-12-07 DIAGNOSIS — N186 End stage renal disease: Secondary | ICD-10-CM | POA: Diagnosis not present

## 2022-12-07 DIAGNOSIS — Z992 Dependence on renal dialysis: Secondary | ICD-10-CM | POA: Diagnosis not present

## 2022-12-07 DIAGNOSIS — N2581 Secondary hyperparathyroidism of renal origin: Secondary | ICD-10-CM | POA: Diagnosis not present

## 2022-12-08 ENCOUNTER — Telehealth: Payer: Self-pay | Admitting: Nurse Practitioner

## 2022-12-08 ENCOUNTER — Ambulatory Visit: Payer: Medicare HMO | Admitting: Physical Therapy

## 2022-12-08 DIAGNOSIS — I482 Chronic atrial fibrillation, unspecified: Secondary | ICD-10-CM | POA: Diagnosis not present

## 2022-12-08 DIAGNOSIS — N186 End stage renal disease: Secondary | ICD-10-CM | POA: Diagnosis not present

## 2022-12-08 DIAGNOSIS — E1122 Type 2 diabetes mellitus with diabetic chronic kidney disease: Secondary | ICD-10-CM | POA: Diagnosis not present

## 2022-12-08 DIAGNOSIS — G894 Chronic pain syndrome: Secondary | ICD-10-CM | POA: Diagnosis not present

## 2022-12-08 DIAGNOSIS — E1169 Type 2 diabetes mellitus with other specified complication: Secondary | ICD-10-CM | POA: Diagnosis not present

## 2022-12-08 DIAGNOSIS — D631 Anemia in chronic kidney disease: Secondary | ICD-10-CM | POA: Diagnosis not present

## 2022-12-08 DIAGNOSIS — E7849 Other hyperlipidemia: Secondary | ICD-10-CM | POA: Diagnosis not present

## 2022-12-08 DIAGNOSIS — I132 Hypertensive heart and chronic kidney disease with heart failure and with stage 5 chronic kidney disease, or end stage renal disease: Secondary | ICD-10-CM | POA: Diagnosis not present

## 2022-12-08 DIAGNOSIS — I5022 Chronic systolic (congestive) heart failure: Secondary | ICD-10-CM | POA: Diagnosis not present

## 2022-12-08 NOTE — Telephone Encounter (Signed)
Adam Howell with [facility] is calling in because states he is in pain and wants to know if there is something that Jolene can send in that will last him until his appointment on Thursday. Adam Howell said it was difficult to get through Phys. Therapy today due to pt being in pain.

## 2022-12-09 DIAGNOSIS — Z992 Dependence on renal dialysis: Secondary | ICD-10-CM | POA: Diagnosis not present

## 2022-12-09 DIAGNOSIS — N2581 Secondary hyperparathyroidism of renal origin: Secondary | ICD-10-CM | POA: Diagnosis not present

## 2022-12-09 DIAGNOSIS — N186 End stage renal disease: Secondary | ICD-10-CM | POA: Diagnosis not present

## 2022-12-10 ENCOUNTER — Other Ambulatory Visit: Payer: Self-pay | Admitting: Nurse Practitioner

## 2022-12-10 ENCOUNTER — Encounter: Payer: Self-pay | Admitting: Nurse Practitioner

## 2022-12-10 ENCOUNTER — Ambulatory Visit: Payer: Medicare HMO | Admitting: Nurse Practitioner

## 2022-12-10 ENCOUNTER — Ambulatory Visit: Payer: Medicare HMO

## 2022-12-10 VITALS — BP 108/64 | HR 68 | Temp 98.1°F | Resp 18 | Ht 75.0 in

## 2022-12-10 DIAGNOSIS — G894 Chronic pain syndrome: Secondary | ICD-10-CM | POA: Diagnosis not present

## 2022-12-10 DIAGNOSIS — L97921 Non-pressure chronic ulcer of unspecified part of left lower leg limited to breakdown of skin: Secondary | ICD-10-CM | POA: Insufficient documentation

## 2022-12-10 DIAGNOSIS — M79605 Pain in left leg: Secondary | ICD-10-CM | POA: Diagnosis not present

## 2022-12-10 MED ORDER — HYDROCODONE-ACETAMINOPHEN 10-325 MG PO TABS: 1.0000 | ORAL_TABLET | Freq: Four times a day (QID) | ORAL | 0 refills | Status: AC | PRN

## 2022-12-10 MED ORDER — DOXYCYCLINE HYCLATE 100 MG PO TABS: 100.0000 mg | ORAL_TABLET | Freq: Two times a day (BID) | ORAL | 0 refills | Status: AC

## 2022-12-10 MED ORDER — COLLAGENASE 250 UNIT/GM EX OINT: 1.0000 | TOPICAL_OINTMENT | Freq: Every day | CUTANEOUS | 0 refills | Status: DC

## 2022-12-10 NOTE — Progress Notes (Signed)
BP 108/64 (BP Location: Right Arm, Patient Position: Sitting, Cuff Size: Normal)   Pulse 68   Temp 98.1 F (36.7 C) (Oral)   Resp 18   Ht 6\' 3"  (1.905 m)   SpO2 97%   BMI 31.25 kg/m    Subjective:    [name], male    DOB: 1961/01/01, 62 y.o.   MRN: [id number]  HPI: Adam Howell is a 62 y.o. male  Chief Complaint  Patient presents with   Leg Pain    Patient states he has been have 10/10 L leg pain for the past few days. Describes the pain as a constant aching pain. States the pain has been keeping him up at night.    CHRONIC PAIN  Currently taking Norco 5-325 MG for chronic pain -- filled last 11/19/22.  He originally took this only on dialysis days, but since his right AKA this year as needed it daily.  Is scheduled to see pain clinic on 12/14/22.  Presents today for increased left leg pain for past few days that keeps him up at night.  Has wound presenting to left lower leg, started about 2 months ago per patient and he has been putting lotion on them.  These have not opened. Pain started when wounds presented. Resting leg it hurts more, putting pressure on it is not as bad.  History of amputation to right leg this year.   Present dose: 10 MG Morphine equivalents 10 MG Pain control status: uncontrolled Duration: months Location: left lower leg Quality: aching and throbbing Current Pain Level: 10/10 Previous Pain Level: 2/10 Breakthrough pain: no Benefit from narcotic medications: no What Activities task can be accomplished with current medication? none Interested in weaning off narcotics:no   Stool softners/OTC fiber: no  Previous pain specialty evaluation: no Non-narcotic analgesic meds: yes Narcotic contract:  is going to pain management    Relevant past medical, surgical, family and social history reviewed and updated as indicated. Interim medical history since our last visit reviewed. Allergies and medications reviewed and updated.  Review  of Systems  Per HPI unless specifically indicated above     Objective:    BP 108/64 (BP Location: Right Arm, Patient Position: Sitting, Cuff Size: Normal)   Pulse 68   Temp 98.1 F (36.7 C) (Oral)   Resp 18   Ht 6\' 3"  (1.905 m)   SpO2 97%   BMI 31.25 kg/m   Wt Readings from Last 3 Encounters:  08/27/22 250 lb (113.4 kg)  05/12/22 232 lb (105.2 kg)  04/04/22 231 lb 7.7 oz (105 kg)    Physical Exam Vitals and nursing note reviewed.  Constitutional:      General: He is awake. He is not in acute distress.    Appearance: He is well-developed and well-groomed. He is not ill-appearing or toxic-appearing.  HENT:     Head: Normocephalic.     Right Ear: Hearing and external ear normal.     Left Ear: Hearing and external ear normal.  Eyes:     General: Lids are normal.     Extraocular Movements: Extraocular movements intact.     Conjunctiva/sclera: Conjunctivae normal.  Neck:     Thyroid: No thyromegaly.     Vascular: No carotid bruit.  Cardiovascular:     Rate and Rhythm: Normal rate and regular rhythm.     Pulses:          Dorsalis pedis pulses are 1+ on the left side.  Posterior tibial pulses are 1+ on the left side.     Heart sounds: Normal heart sounds. No murmur heard.    No gallop.     Comments: Diminished pulses to left foot noted.  Very dry skin around ankle and to foot left side. Pulmonary:     Effort: No accessory muscle usage or respiratory distress.     Breath sounds: Normal breath sounds.  Abdominal:     General: Bowel sounds are normal. There is no distension.     Palpations: Abdomen is soft.     Tenderness: There is no abdominal tenderness.  Musculoskeletal:     Cervical back: Full passive range of motion without pain.     Right lower leg: No edema.     Left lower leg: No edema.     Left foot: Normal range of motion.     Right Lower Extremity: Right leg is amputated above knee.  Feet:     Left foot:     Protective Sensation: 10 sites tested.  6  sites sensed.     Skin integrity: Ulcer (to lower left leg) and dry skin present.     Toenail Condition: Left toenails are abnormally thick.  Lymphadenopathy:     Cervical: No cervical adenopathy.  Skin:    General: Skin is warm.     Capillary Refill: Capillary refill takes less than 2 seconds.     Findings: Wound present.       Neurological:     Mental Status: [name] and oriented to person, place, and time.     Deep Tendon Reflexes: Reflexes are normal and symmetric.     Reflex Scores:      Brachioradialis reflexes are 2+ on the right side and 2+ on the left side.      Patellar reflexes are 2+ on the right side and 2+ on the left side. Psychiatric:        Attention and Perception: Attention normal.        Mood and Affect: Mood normal.        Speech: Speech normal.        Behavior: Behavior normal. Behavior is cooperative.        Thought Content: Thought content normal.     Results for orders placed or performed in visit on 10/12/22  Bayer DCA Hb A1c Waived  Result Value Ref Range   HB A1C (BAYER DCA - WAIVED) 5.9 (H) 4.8 - 5.6 %      Assessment & Plan:   Problem List Items Addressed This Visit       Musculoskeletal and Integument   Ulcer of left lower extremity, limited to breakdown of skin (HCC) - Primary    Reports these started 2 months ago.  Concern as history of ulcerations to right leg leading to AKA.  Current wounds are covered in thick crusting, concern for underneath aspect of this.  Can see pink tissue under one of the wounds, small open area.  He is also having significant pain with these and to left leg.  Will place urgent referral to vascular and wound clinic - scheduled while he was in office today and provided dates/time to him as he needs to book transportation 3 days ahead.  Sent in Doxycyline due to concern for infection and Santyl to place on wounds.      Relevant Orders   Ambulatory referral to Vascular Surgery   Ambulatory referral to Wound  Clinic   Ambulatory referral to Wound  Clinic     Other   Chronic pain syndrome    Ongoing, taking increased Norco due to recent AKA right side and current left leg pain.  Controlled substance contract up to date, he is now living with niece -- prior was living with daughter who was taking his pain medication.  Scheduled to see pain clinic on 30th.  For now will increase Norco to 10-325 MG every 6 hours -- 5 days supply provided to help get to pain clinic visit.      Relevant Medications   HYDROcodone-acetaminophen (NORCO) 10-325 MG tablet   Left leg pain    Acute starting about two months ago per patient, along with wounds.  Concern for circulation, diminished pulses noted.  Will increase his Norco to 10-325 MG every 6 hours, provided 5 days as he is scheduled to see pain clinic on 30th.  Urgent referral to vascular and scheduled for him today, provided date and time to him in office so he can schedule transportation.        Relevant Orders   Ambulatory referral to Vascular Surgery   Ambulatory referral to Wound Clinic   Ambulatory referral to Wound Clinic    Time: 25 minutes, >50% spent counseling/or care coordination   Follow up plan: Return in about 2 weeks (around 12/24/2022) for Wound check.

## 2022-12-10 NOTE — Patient Instructions (Addendum)
Ultrasound for Vascular is on 12/17/22 at 11 am ([facility]) Vascular visit is 12/25/22 arrive by 1045 ( [facility]) [facility]) 12/17/22 at 8 am  Wound Care, Adult Taking care of your wound properly can help to prevent pain, infection, and scarring. It can also help your wound heal more quickly. Follow instructions from your health care provider about how to care for your wound. Supplies needed: Soap and water. Wound cleanser, saline, or germ-free (sterile) water. Gauze. If needed, a clean bandage (dressing) or other type of wound dressing material to cover or place in the wound. Follow your health care provider's instructions about what dressing supplies to use. Cream or topical ointment to apply to the wound, if told by your health care provider. How to care for your wound Cleaning the wound Ask your health care provider how to clean the wound. This may include: Using mild soap and water, a wound cleanser, saline, or sterile water. Using a clean gauze to pat the wound dry after cleaning it. Do not rub or scrub the wound. Dressing care Wash your hands with soap and water for at least 20 seconds before and after you change the dressing. If soap and water are not available, use hand sanitizer. Change your dressing as told by your health care provider. This may include: Cleaning or rinsing out (irrigating) the wound. Application of cream or topical ointment, if told by your health care provider. Placing a dressing over the wound or in the wound (packing). Covering the wound with an outer dressing. Leave stitches (sutures), staples, skin glue, or adhesive strips in place. These skin closures may need to stay in place for 2 weeks or longer. If adhesive strip edges start to loosen and curl up, you may trim the loose edges. Do not remove adhesive strips completely unless your health care provider tells you to do that. Ask your health care provider  when you can leave the wound uncovered. Checking for infection Check your wound area every day for signs of infection. Check for: More redness, swelling, or pain. Fluid or blood. Warmth. Pus or a bad smell.  Follow these instructions at home Medicines If you were prescribed an antibiotic medicine, cream, or ointment, take or apply it as told by your health care provider. Do not stop using the antibiotic even if your condition improves. If you were prescribed pain medicine, take it 30 minutes before you do any wound care or as told by your health care provider. Take over-the-counter and prescription medicines only as told by your health care provider. Eating and drinking Eat a diet that includes protein, vitamin A, vitamin C, and other nutrient-rich foods to help the wound heal. Foods rich in protein include meat, fish, eggs, dairy, beans, and nuts. Foods rich in vitamin A include carrots and dark green, leafy vegetables. Foods rich in vitamin C include citrus fruits, tomatoes, broccoli, and peppers. Drink enough fluid to keep your urine pale yellow. General instructions Do not take baths, swim, or use a hot tub until your health care provider approves. Ask your health care provider if you may take showers. You may only be allowed to take sponge baths. Do not scratch or pick at the wound. Keep it covered as told by your health care provider. Return to your normal activities as told by your health care provider. Ask your health care provider what activities are safe for you. Protect your wound from the sun when you are outside for  the first 6 months, or for as long as told by your health care provider. Cover up the scar area or apply sunscreen that has an SPF of at least 30. Do not use any products that contain nicotine or tobacco. These products include cigarettes, chewing tobacco, and vaping devices, such as e-cigarettes. If you need help quitting, ask your health care provider. Keep all  follow-up visits. This is important. Contact a health care provider if: You received a tetanus shot and you have swelling, severe pain, redness, or bleeding at the injection site. Your pain is not controlled with medicine. You have any of these signs of infection: More redness, swelling, or pain around the wound. Fluid or blood coming from the wound. Warmth coming from the wound. A fever or chills. You are nauseous or you vomit. You are dizzy. You have a new rash or hardness around the wound. Get help right away if: You have a red streak of skin near the area around your wound. Pus or a bad smell coming from the wound. Your wound has been closed with staples, sutures, skin glue, or adhesive strips and it begins to open up and separate. Your wound is bleeding, and the bleeding does not stop with gentle pressure. These symptoms may represent a serious problem that is an emergency. Do not wait to see if the symptoms will go away. Get medical help right away. Call your local emergency services (911 in the [place] Do not drive yourself to the hospital. Summary Always wash your hands with soap and water for at least 20 seconds before and after changing your dressing. Change your dressing as told by your health care provider. To help with healing, eat foods that are rich in protein, vitamin A, vitamin C, and other nutrients. Check your wound every day for signs of infection. Contact your health care provider if you think that your wound is infected. This information is not intended to replace advice given to you by your health care provider. Make sure you discuss any questions you have with your health care provider. Document Revised: 07/09/2020 Document Reviewed: 07/09/2020 Elsevier Patient Education  2024 [employer].

## 2022-12-10 NOTE — Assessment & Plan Note (Signed)
Acute starting about two months ago per patient, along with wounds.  Concern for circulation, diminished pulses noted.  Will increase his Norco to 10-325 MG every 6 hours, provided 5 days as he is scheduled to see pain clinic on 30th.  Urgent referral to vascular and scheduled for him today, provided date and time to him in office so he can schedule transportation.

## 2022-12-10 NOTE — Assessment & Plan Note (Signed)
Reports these started 2 months ago.  Concern as history of ulcerations to right leg leading to AKA.  Current wounds are covered in thick crusting, concern for underneath aspect of this.  Can see pink tissue under one of the wounds, small open area.  He is also having significant pain with these and to left leg.  Will place urgent referral to vascular and wound clinic - scheduled while he was in office today and provided dates/time to him as he needs to book transportation 3 days ahead.  Sent in Doxycyline due to concern for infection and Santyl to place on wounds.

## 2022-12-10 NOTE — Telephone Encounter (Signed)
Medication Refill - Medication: Pt is calling to report that his medication has not been received by the pharmacy  doxycycline (VIBRA-TABS) 100 MG tablet [[id number]]   collagenase (SANTYL) 250 UNIT/GM ointment [[id number]]   HYDROcodone-acetaminophen (NORCO) 10-325 MG tablet [[id number]]   Has the patient contacted their pharmacy? Yes.   (Agent: If no, request that the patient contact the pharmacy for the refill. If patient does not wish to contact the pharmacy document the reason why and proceed with request.) (Agent: If yes, when and what did the pharmacy advise?)  Preferred Pharmacy (with phone number or street name):  [facility] [place], [place] - [address] Phone: [phone]  Fax: [phone]     Has the patient been seen for an appointment in the last year OR does the patient have an upcoming appointment? Yes.    Agent: Please be advised that RX refills may take up to 3 business days. We ask that you follow-up with your pharmacy.

## 2022-12-10 NOTE — Assessment & Plan Note (Signed)
Ongoing, taking increased Norco due to recent AKA right side and current left leg pain.  Controlled substance contract up to date, he is now living with niece -- prior was living with daughter who was taking his pain medication.  Scheduled to see pain clinic on 30th.  For now will increase Norco to 10-325 MG every 6 hours -- 5 days supply provided to help get to pain clinic visit.

## 2022-12-11 ENCOUNTER — Telehealth: Payer: Self-pay

## 2022-12-11 ENCOUNTER — Other Ambulatory Visit: Payer: Self-pay | Admitting: Nurse Practitioner

## 2022-12-11 DIAGNOSIS — N186 End stage renal disease: Secondary | ICD-10-CM | POA: Diagnosis not present

## 2022-12-11 DIAGNOSIS — Z992 Dependence on renal dialysis: Secondary | ICD-10-CM | POA: Diagnosis not present

## 2022-12-11 DIAGNOSIS — N2581 Secondary hyperparathyroidism of renal origin: Secondary | ICD-10-CM | POA: Diagnosis not present

## 2022-12-11 MED ORDER — SANTYL 250 UNIT/GM EX OINT: TOPICAL_OINTMENT | Freq: Every day | CUTANEOUS | 0 refills | Status: DC

## 2022-12-11 NOTE — Telephone Encounter (Signed)
Judeth Cornfield from [place] requesting change Santyl SIG: to the 1.5 cm wounds apply 1.2 centimeters To the 0.5 cm by 0.5 cm: apply 0.1 centimeter. # 30 grams per pharmacy

## 2022-12-11 NOTE — Telephone Encounter (Signed)
Called [facility]. The prescription that was sent in this morning with the new wound dimensions and information failed to be transmitted to the pharmacy. Can we resend the prescription please? Per [employer], insurance requires the information to be on the RX and would not take a verbal.

## 2022-12-11 NOTE — Telephone Encounter (Signed)
Requested medications are due for refill today.  no  Requested medications are on the active medications list.  yes  Last refill. 12/10/2022 90g 0 rf  Future visit scheduled.   yes  Notes to clinic.  Pharmacy comment: Insurance requires type of wound (burn or chronic?), wound width and length, and duration of therapy. Please resend with all of this info.    Requested Prescriptions  Pending Prescriptions Disp Refills   SANTYL 250 UNIT/GM ointment [Pharmacy Med Name: SANTYL 250/GM       OIN] 90 g 0    Sig: APPLY TOPICALLY DAILY     Dermatology:  Other Passed - 12/10/2022  1:30 PM      Passed - Valid encounter within last 12 months    Recent Outpatient Visits           Yesterday Ulcer of left lower extremity, limited to breakdown of skin (HCC)   [place] [facility] [place], Alden T, NP   2 months ago Type 2 diabetes mellitus with ESRD (end-stage renal disease) (HCC)   [place] [facility] [place], Jolene T, NP   4 months ago Type 2 diabetes mellitus with ESRD (end-stage renal disease) (HCC)   [place] [facility] [place], Jolene T, NP   7 months ago Type 2 diabetes mellitus with ESRD (end-stage renal disease) (HCC)   [place] [facility] [place], Jolene T, NP   8 months ago S/P AKA (above knee amputation), right (HCC)   [place] [facility] [place], Megan P, DO       Future Appointments             In 1 week Cannady, Dorie Rank, NP  [facility]   In 1 month Bradley, [place] T, NP  [facility]   In 2 months End, Cristal Deer, MD [facility]

## 2022-12-11 NOTE — Telephone Encounter (Signed)
Patient states that the cream that was prescribed for sores on his legs was not covered with insurance and would like a generic RX sent to pharmacy.Please advise

## 2022-12-11 NOTE — Telephone Encounter (Signed)
Requested medications are due for refill today.  no  Requested medications are on the active medications list.  yes  Last refill. 12/10/2022   Future visit scheduled.   yes  Notes to clinic.  Refill /refusal not delegated.    Requested Prescriptions  Pending Prescriptions Disp Refills   HYDROcodone-acetaminophen (NORCO) 10-325 MG tablet 20 tablet 0    Sig: Take 1 tablet by mouth every 6 (six) hours as needed for up to 5 days for severe pain or moderate pain.     Not Delegated - Analgesics:  Opioid Agonist Combinations Failed - 12/11/2022  8:35 AM      Failed - This refill cannot be delegated      Failed - Urine Drug Screen completed in last 360 days      Passed - Valid encounter within last 3 months    Recent Outpatient Visits           Yesterday Ulcer of left lower extremity, limited to breakdown of skin (HCC)   [place] [facility] [place], [place] T, NP   2 months ago Type 2 diabetes mellitus with ESRD (end-stage renal disease) (HCC)   [place] [facility] [place], Jolene T, NP   4 months ago Type 2 diabetes mellitus with ESRD (end-stage renal disease) (HCC)   [place] [facility] [place], Jolene T, NP   7 months ago Type 2 diabetes mellitus with ESRD (end-stage renal disease) (HCC)   [place] [facility] [place], Jolene T, NP   8 months ago S/P AKA (above knee amputation), right (HCC)   [place] [facility] [place], Megan P, DO       Future Appointments             In 1 week Cannady, Dorie Rank, NP [place] [facility]   In 1 month [place], [place] T, NP [place] [facility]   In 2 months End, Cristal Deer, MD [place] [facility] Prescriptions Disp Refills   doxycycline (VIBRA-TABS) 100 MG tablet 14 tablet 0    Sig: Take 1 tablet (100 mg total) by mouth 2 (two) times daily for 7 days.     Off-Protocol Failed -  12/11/2022  8:35 AM      Failed - Medication not assigned to a protocol, review manually.      Passed - Valid encounter within last 12 months    Recent Outpatient Visits           Yesterday Ulcer of left lower extremity, limited to breakdown of skin (HCC)   [place] [facility] [place], [place] T, NP   2 months ago Type 2 diabetes mellitus with ESRD (end-stage renal disease) (HCC)   [place] [facility] [place], [place] T, NP   4 months ago Type 2 diabetes mellitus with ESRD (end-stage renal disease) (HCC)   [place] [facility] [place], Jolene T, NP   7 months ago Type 2 diabetes mellitus with ESRD (end-stage renal disease) (HCC)   [place] [facility] [place], Corrie Dandy T, NP   8 months ago S/P AKA (above knee amputation), right (HCC)   [place] [facility] [place], Oralia Rud, DO       Future Appointments             In 1 week Marjie Skiff, NP [facility]   In 1 month Cannady, Dorie Rank, NP [place] [facility]   In 2 months End, Cristal Deer, MD [place] [facility]             collagenase [facility]) 250 UNIT/GM ointment 90 g 0    Sig: Apply 1 Application topically daily.     Dermatology:  Other Passed - 12/11/2022  8:35 AM      Passed - Valid encounter within last 12 months    Recent Outpatient Visits           Yesterday Ulcer of left lower extremity, limited to breakdown of skin (HCC)   [place] [facility] [place], [place] T, NP   2 months ago Type 2 diabetes mellitus with ESRD (end-stage renal disease) (HCC)   [place] [facility] [place], [place] T, NP   4 months ago Type 2 diabetes mellitus with ESRD (end-stage renal disease) (HCC)   [place] [facility] [place], Jolene T, NP   7 months ago Type 2 diabetes mellitus with ESRD (end-stage renal disease) (HCC)   [place] [facility] [place], Corrie Dandy T, NP   8 months ago S/P AKA (above knee amputation), right (HCC)   [place] [facility] [place], Megan P, DO       Future Appointments             In 1 week Cannady, Dorie Rank, NP [place] [facility]   In 1 month [place], Dorie Rank, NP [place] [facility]   In 2 months End, Cristal Deer, MD [facility]

## 2022-12-11 NOTE — Telephone Encounter (Signed)
Patient states medication was not received by pharmacy, requesting to resend to [facility] pharmacy.

## 2022-12-11 NOTE — Telephone Encounter (Signed)
Requested Prescriptions  Pending Prescriptions Disp Refills   HYDROcodone-acetaminophen (NORCO) 10-325 MG tablet 20 tablet 0    Sig: Take 1 tablet by mouth every 6 (six) hours as needed for up to 5 days for severe pain or moderate pain.     Not Delegated - Analgesics:  Opioid Agonist Combinations Failed - 12/11/2022  8:35 AM      Failed - This refill cannot be delegated      Failed - Urine Drug Screen completed in last 360 days      Passed - Valid encounter within last 3 months    Recent Outpatient Visits           Yesterday Ulcer of left lower extremity, limited to breakdown of skin (HCC)   [place] [facility] [place], Dunellen T, NP   2 months ago Type 2 diabetes mellitus with ESRD (end-stage renal disease) (HCC)   [place] [facility] [place], Jolene T, NP   4 months ago Type 2 diabetes mellitus with ESRD (end-stage renal disease) (HCC)   [place] [facility] [place], Jolene T, NP   7 months ago Type 2 diabetes mellitus with ESRD (end-stage renal disease) (HCC)   [place] [facility] [employer], Jolene T, NP   8 months ago S/P AKA (above knee amputation), right (HCC)   [facility] [place], Megan P, DO       Future Appointments             In 1 week Cannady, Dorie Rank, NP [place] [facility]   In 1 month Callaway, Mariposa T, NP [place] [facility]   In 2 months End, Cristal Deer, MD [facility] Prescriptions Disp Refills   doxycycline (VIBRA-TABS) 100 MG tablet 14 tablet 0    Sig: Take 1 tablet (100 mg total) by mouth 2 (two) times daily for 7 days.     Off-Protocol Failed - 12/11/2022  8:35 AM      Failed - Medication not assigned to a protocol, review manually.      Passed - Valid encounter within last 12 months    Recent Outpatient Visits           Yesterday Ulcer of left lower extremity, limited  to breakdown of skin (HCC)   [place] [facility] [place], [place] T, NP   2 months ago Type 2 diabetes mellitus with ESRD (end-stage renal disease) (HCC)   [place] [facility] [place], [place] Creek T, NP   4 months ago Type 2 diabetes mellitus with ESRD (end-stage renal disease) (HCC)   [place] [facility] [place], Jolene T, NP   7 months ago Type 2 diabetes mellitus with ESRD (end-stage renal disease) (HCC)   [place] [facility] [place], Corrie Dandy T, NP   8 months ago S/P AKA (above knee amputation), right (HCC)   [place] [facility] [place], Megan P, DO       Future Appointments             In 1 week Cannady, Dorie Rank, NP [place] [facility]   In 1 month [place], Dorie Rank, NP [place] [facility]   In 2 months End, Cristal Deer, MD [place] [facility]             collagenase (  SANTYL) 250 UNIT/GM ointment 90 g 0    Sig: Apply 1 Application topically daily.     Dermatology:  Other Passed - 12/11/2022  8:35 AM      Passed - Valid encounter within last 12 months    Recent Outpatient Visits           Yesterday Ulcer of left lower extremity, limited to breakdown of skin (HCC)   [place] [facility] [place], Dennison T, NP   2 months ago Type 2 diabetes mellitus with ESRD (end-stage renal disease) (HCC)   [place] [facility] [place], [place] T, NP   4 months ago Type 2 diabetes mellitus with ESRD (end-stage renal disease) (HCC)   [place] [facility] [place], Jolene T, NP   7 months ago Type 2 diabetes mellitus with ESRD (end-stage renal disease) (HCC)   [place] [facility] [place], Corrie Dandy T, NP   8 months ago S/P AKA (above knee amputation), right (HCC)   [place] [facility] [place], Megan P, DO       Future Appointments             In 1 week Cannady, Dorie Rank,  NP [place] [facility]   In 1 month [place], Dorie Rank, NP [place] [facility]   In 2 months End, Cristal Deer, MD [facility]

## 2022-12-11 NOTE — Telephone Encounter (Incomplete)
Requested medication (s) are due for refill today: {Yes/No}  Requested medication (s) are on the active medication list: {Yes/No}  Last refill:  ***  Future visit scheduled: {Yes/No}  Notes to clinic:  ***     Requested Prescriptions  Pending Prescriptions Disp Refills   doxycycline (VIBRA-TABS) 100 MG tablet 14 tablet 0    Sig: Take 1 tablet (100 mg total) by mouth 2 (two) times daily for 7 days.     Off-Protocol Failed - 12/11/2022  8:35 AM      Failed - Medication not assigned to a protocol, review manually.      Passed - Valid encounter within last 12 months    Recent Outpatient Visits           Yesterday Ulcer of left lower extremity, limited to breakdown of skin (HCC)   [facility] [place], Freer T, NP   2 months ago Type 2 diabetes mellitus with ESRD (end-stage renal disease) (HCC)   [place] [facility] [place], Car[place] T, NP   4 months ago Type 2 diabetes mellitus with ESRD (end-stage renal disease) (HCC)   [place] [facility] [place], Jolene T, NP   7 months ago Type 2 diabetes mellitus with ESRD (end-stage renal disease) (HCC)   [place] [facility] [place], Corrie Dandy T, NP   8 months ago S/P AKA (above knee amputation), right (HCC)   [place] [facility] [place], Megan P, DO       Future Appointments             In 1 week Cannady, Dorie Rank, NP [place] [facility]   In 1 month [place], Dorie Rank, NP [place] [facility]   In 2 months End, Cristal Deer, MD [place] [facility]             collagenase [facility]) 250 UNIT/GM ointment 90 g 0    Sig: Apply 1 Application topically daily.     Dermatology:  Other Passed - 12/11/2022  8:35 AM      Passed - Valid encounter within last 12 months    Recent Outpatient Visits           Yesterday Ulcer of left lower extremity, limited to breakdown of skin (HCC)   [place]  [facility] [place], [place] T, NP   2 months ago Type 2 diabetes mellitus with ESRD (end-stage renal disease) (HCC)   [place] [facility] [place], [place] T, NP   4 months ago Type 2 diabetes mellitus with ESRD (end-stage renal disease) (HCC)   [place] [facility] [place], Jolene T, NP   7 months ago Type 2 diabetes mellitus with ESRD (end-stage renal disease) (HCC)   [place] [facility] [place], Corrie Dandy T, NP   8 months ago S/P AKA (above knee amputation), right (HCC)   [place] [facility] [place], Oralia Rud, DO       Future Appointments             In 1 week Cannady, Dorie Rank, NP [place] [facility]   In 1 month [place], Dorie Rank, NP [place] [facility]   In 2 months End, Cristal Deer, MD  [facility] at [facility]             HYDROcodone-acetaminophen [facility]) 10-325 MG tablet 20 tablet 0    Sig: Take 1 tablet  by mouth every 6 (six) hours as needed for up to 5 days for severe pain or moderate pain.     Not Delegated - Analgesics:  Opioid Agonist Combinations Failed - 12/11/2022  8:35 AM      Failed - This refill cannot be delegated      Failed - Urine Drug Screen completed in last 360 days      Passed - Valid encounter within last 3 months    Recent Outpatient Visits           Yesterday Ulcer of left lower extremity, limited to breakdown of skin (HCC)   [place] [facility] [place], Harmon T, NP   2 months ago Type 2 diabetes mellitus with ESRD (end-stage renal disease) (HCC)   [place] [facility] [place], Jolene T, NP   4 months ago Type 2 diabetes mellitus with ESRD (end-stage renal disease) (HCC)   [place] [facility] [place], Jolene T, NP   7 months ago Type 2 diabetes mellitus with ESRD (end-stage renal disease) (HCC)   [place] [facility] [place], Jolene T, NP   8 months  ago S/P AKA (above knee amputation), right (HCC)   [place] [facility] [place], Megan P, DO       Future Appointments             In 1 week Cannady, Dorie Rank, NP [place] [facility]   In 1 month [place], Metamora T, NP [place] [facility]   In 2 months End, Cristal Deer, MD [facility]

## 2022-12-13 MED ORDER — SANTYL 250 UNIT/GM EX OINT: TOPICAL_OINTMENT | CUTANEOUS | 0 refills | Status: DC

## 2022-12-13 NOTE — Addendum Note (Signed)
Addended by: Aura Dials T on: 12/13/2022 09:14 AM   Modules accepted: Orders

## 2022-12-14 ENCOUNTER — Telehealth: Payer: Self-pay | Admitting: Nurse Practitioner

## 2022-12-14 ENCOUNTER — Ambulatory Visit: Payer: Medicare HMO

## 2022-12-14 DIAGNOSIS — Z992 Dependence on renal dialysis: Secondary | ICD-10-CM | POA: Diagnosis not present

## 2022-12-14 DIAGNOSIS — N186 End stage renal disease: Secondary | ICD-10-CM | POA: Diagnosis not present

## 2022-12-14 DIAGNOSIS — N2581 Secondary hyperparathyroidism of renal origin: Secondary | ICD-10-CM | POA: Diagnosis not present

## 2022-12-14 DIAGNOSIS — I120 Hypertensive chronic kidney disease with stage 5 chronic kidney disease or end stage renal disease: Secondary | ICD-10-CM | POA: Diagnosis not present

## 2022-12-14 NOTE — Telephone Encounter (Signed)
Copied from CRM [id number]. Topic: General - Other >> Dec 11, 2022  5:53 PM Everette C wrote: Reason for CRM: The patient has been directed by their pharmacy to contact their PCP for prior authorization of their collagenase (SANTYL) 250 UNIT/GM ointment [[id number]]  prescription   Please contact the patient further when possible

## 2022-12-14 NOTE — Telephone Encounter (Signed)
Already filled and he sees pain management this week

## 2022-12-16 ENCOUNTER — Ambulatory Visit: Payer: Medicare HMO

## 2022-12-16 DIAGNOSIS — Z992 Dependence on renal dialysis: Secondary | ICD-10-CM | POA: Diagnosis not present

## 2022-12-16 DIAGNOSIS — N2581 Secondary hyperparathyroidism of renal origin: Secondary | ICD-10-CM | POA: Diagnosis not present

## 2022-12-16 DIAGNOSIS — N186 End stage renal disease: Secondary | ICD-10-CM | POA: Diagnosis not present

## 2022-12-17 ENCOUNTER — Telehealth: Payer: Self-pay | Admitting: Nurse Practitioner

## 2022-12-17 ENCOUNTER — Ambulatory Visit: Payer: Medicare HMO | Admitting: Physician Assistant

## 2022-12-17 ENCOUNTER — Encounter (INDEPENDENT_AMBULATORY_CARE_PROVIDER_SITE_OTHER): Payer: Medicare HMO

## 2022-12-17 ENCOUNTER — Ambulatory Visit (INDEPENDENT_AMBULATORY_CARE_PROVIDER_SITE_OTHER): Payer: Medicare HMO

## 2022-12-17 ENCOUNTER — Other Ambulatory Visit: Payer: Self-pay | Admitting: Nurse Practitioner

## 2022-12-17 DIAGNOSIS — L97911 Non-pressure chronic ulcer of unspecified part of right lower leg limited to breakdown of skin: Secondary | ICD-10-CM | POA: Diagnosis not present

## 2022-12-17 NOTE — Telephone Encounter (Addendum)
Velna Hatchet with [facility] has called back and stated she reached out to the patient and patient stated he went to the wrong office, he went to [facility] instead of the [facility] Office and Velna Hatchet states they had to reschedule patient's appointment to 01/05/2023 due to it being past patient's appt time.

## 2022-12-17 NOTE — Telephone Encounter (Signed)
Medication Refill - Medication: HYDROcodone-acetaminophen (NORCO) 10-325 MG tablet   Has the patient contacted their pharmacy? No.   Preferred Pharmacy (with phone number or street name):  [facility] [address] [place]), [place] - [address] Phone: [phone]  Fax: [phone]     Has the patient been seen for an appointment in the last year OR does the patient have an upcoming appointment? Yes.    Agent: Please be advised that RX refills may take up to 3 business days. We ask that you follow-up with your pharmacy.

## 2022-12-17 NOTE — Telephone Encounter (Signed)
Copied from CRM [id number]. Topic: General - Inquiry >> Dec 17, 2022  8:36 AM Marlow Baars wrote: Reason for CRM: Velna Hatchet from [facility] called to let the office know the patient did not show up for his appt. She would like to talk with someone when they get the chance. Please assist further.

## 2022-12-17 NOTE — Telephone Encounter (Signed)
Requested medications are due for refill today.  unsure  Requested medications are on the active medications list.  no  Last refill. 02/02/2020  Future visit scheduled.   yes  Notes to clinic.  Refill not delegated.    Requested Prescriptions  Pending Prescriptions Disp Refills   HYDROcodone-acetaminophen (NORCO) 10-325 MG tablet 20 tablet 0    Sig: Take 1 tablet by mouth every 6 (six) hours as needed for up to 5 days. Only takes after Dialysis     Not Delegated - Analgesics:  Opioid Agonist Combinations Failed - 12/17/2022 10:23 AM      Failed - This refill cannot be delegated      Failed - Urine Drug Screen completed in last 360 days      Passed - Valid encounter within last 3 months    Recent Outpatient Visits           1 week ago Ulcer of left lower extremity, limited to breakdown of skin (HCC)   [place] [facility] [place], [place] T, NP   2 months ago Type 2 diabetes mellitus with ESRD (end-stage renal disease) (HCC)   [place] [facility] [place], Jolene T, NP   4 months ago Type 2 diabetes mellitus with ESRD (end-stage renal disease) (HCC)   [place] [facility] [place], Jolene T, NP   7 months ago Type 2 diabetes mellitus with ESRD (end-stage renal disease) (HCC)   [place] [facility] [place], Jolene T, NP   8 months ago S/P AKA (above knee amputation), right (HCC)   [place] [facility] [place], Megan P, DO       Future Appointments             In 1 week Cannady, Dorie Rank, NP [place] [facility]   In 3 weeks [place], Dorie Rank, NP [place] [facility]   In 2 months End, Cristal Deer, MD [facility]

## 2022-12-18 DIAGNOSIS — Z992 Dependence on renal dialysis: Secondary | ICD-10-CM | POA: Diagnosis not present

## 2022-12-18 DIAGNOSIS — N186 End stage renal disease: Secondary | ICD-10-CM | POA: Diagnosis not present

## 2022-12-18 DIAGNOSIS — N2581 Secondary hyperparathyroidism of renal origin: Secondary | ICD-10-CM | POA: Diagnosis not present

## 2022-12-18 MED ORDER — HYDROCODONE-ACETAMINOPHEN 10-325 MG PO TABS: 1.0000 | ORAL_TABLET | Freq: Four times a day (QID) | ORAL | 0 refills | Status: AC | PRN

## 2022-12-18 NOTE — Telephone Encounter (Signed)
Last OV-12/10/22 Last refill-12/10/22 Please advise

## 2022-12-18 NOTE — Telephone Encounter (Signed)
Spoke to pt stated--rescheduled and was seen pain clinic 12/17/22 at the hospital and had an  ultrasound done on the legs. Pt stated did not received any pain meds from pain clinic.

## 2022-12-20 NOTE — Patient Instructions (Incomplete)

## 2022-12-21 ENCOUNTER — Telehealth: Payer: Self-pay | Admitting: Nurse Practitioner

## 2022-12-21 ENCOUNTER — Ambulatory Visit: Payer: Self-pay

## 2022-12-21 ENCOUNTER — Other Ambulatory Visit: Payer: Self-pay | Admitting: Nurse Practitioner

## 2022-12-21 DIAGNOSIS — Z992 Dependence on renal dialysis: Secondary | ICD-10-CM | POA: Diagnosis not present

## 2022-12-21 DIAGNOSIS — N186 End stage renal disease: Secondary | ICD-10-CM | POA: Diagnosis not present

## 2022-12-21 DIAGNOSIS — N2581 Secondary hyperparathyroidism of renal origin: Secondary | ICD-10-CM | POA: Diagnosis not present

## 2022-12-21 MED ORDER — SILVER HYDROGEL EX GEL: CUTANEOUS | 1 refills | Status: DC

## 2022-12-21 NOTE — Telephone Encounter (Signed)
Copied from CRM [id number]. Topic: General - Other >> Dec 21, 2022  7:50 AM Everette C wrote: Reason for CRM: The patient has called to request an alternative/generic prescription for collagenase (SANTYL) 250 UNIT/GM ointment [[id number]]  The patient has been told that the medication will be more than $1000.00 out of pocket for them   Please contact further when possible

## 2022-12-21 NOTE — Telephone Encounter (Signed)
Patient returned call and is requesting a call back.

## 2022-12-21 NOTE — Telephone Encounter (Signed)
Called and LVM asking for patient to please return my call.  

## 2022-12-21 NOTE — Telephone Encounter (Signed)
Patient called, left VM to return the call to the office to speak to the NT.    Summary: medication request   Patient called stated [address] [facility] does not have the collagenase (SANTYL) 250 UNIT/GM ointment in stock. Please f/u with patient to let him know where he can go to pick up his script.

## 2022-12-21 NOTE — Telephone Encounter (Signed)
Patient called back again to f/u on the script for Silver Hydrogel GEL. Patient stated the [address] [facility] has the script but the medication is not in stock. Patient is requesting to send the script to another pharmacy that has the medication in stock. Please f/u with patient

## 2022-12-21 NOTE — Telephone Encounter (Signed)
Reason for Disposition  [1] Prescription not at pharmacy AND [2] was prescribed by PCP recently (Exception: [facility] has access to EMR and prescription is recorded there. Go to Home Care and confirm for pharmacy.)  Answer Assessment - Initial Assessment Questions 1. NAME of MEDICINE: "What medicine(s) are you calling about?"      One cream she ordered was too expensive and not covered.   She said she was going to order something else.  2. QUESTION: "What is your question?" (e.g., double dose of medicine, side effect)     It's not at the pharmacy.   [facility] [address] tell me they don't have an rx for another cream/ointment from Aura Dials, NP.  3. PRESCRIBER: "Who prescribed the medicine?" Reason: if prescribed by specialist, call should be referred to that group.     Aura Dials NP 4. SYMPTOMS: "Do you have any symptoms?" If Yes, ask: "What symptoms are you having?"  "How bad are the symptoms (e.g., mild, moderate, severe)     N/A 5. PREGNANCY:  "Is there any chance that you are pregnant?" "When was your last menstrual period?"     N/A  Protocols used: Medication Question Call-A-AH  Chief Complaint: Cream/ointment Jolene ordered not covered by my insurance.   She was supposed to call in something else but the [facility] [address] does not have an rx from [place].   Can she send in something in place of the expensive cream not covered by my insurance? Symptoms: N/A Frequency: N/A Pertinent Negatives: Patient denies N/A Disposition: [] ED /[] Urgent Care (no appt availability in office) / [] Appointment(In office/virtual)/ []  [facility]/ [] Home Care/ [] Refused Recommended Disposition /[] [facility]/ [x]  Follow-up with PCP Additional Notes: Message sent to [facility].   Send to [facility] [address]

## 2022-12-22 ENCOUNTER — Telehealth: Payer: Self-pay | Admitting: Nurse Practitioner

## 2022-12-22 DIAGNOSIS — E7849 Other hyperlipidemia: Secondary | ICD-10-CM | POA: Diagnosis not present

## 2022-12-22 DIAGNOSIS — G894 Chronic pain syndrome: Secondary | ICD-10-CM | POA: Diagnosis not present

## 2022-12-22 DIAGNOSIS — I482 Chronic atrial fibrillation, unspecified: Secondary | ICD-10-CM | POA: Diagnosis not present

## 2022-12-22 DIAGNOSIS — I132 Hypertensive heart and chronic kidney disease with heart failure and with stage 5 chronic kidney disease, or end stage renal disease: Secondary | ICD-10-CM | POA: Diagnosis not present

## 2022-12-22 DIAGNOSIS — I5022 Chronic systolic (congestive) heart failure: Secondary | ICD-10-CM | POA: Diagnosis not present

## 2022-12-22 DIAGNOSIS — E1122 Type 2 diabetes mellitus with diabetic chronic kidney disease: Secondary | ICD-10-CM | POA: Diagnosis not present

## 2022-12-22 DIAGNOSIS — E1169 Type 2 diabetes mellitus with other specified complication: Secondary | ICD-10-CM | POA: Diagnosis not present

## 2022-12-22 DIAGNOSIS — N186 End stage renal disease: Secondary | ICD-10-CM | POA: Diagnosis not present

## 2022-12-22 DIAGNOSIS — D631 Anemia in chronic kidney disease: Secondary | ICD-10-CM | POA: Diagnosis not present

## 2022-12-22 MED ORDER — MEDIHONEY WOUND/BURN DRESSING EX PSTE: 1.0000 | PASTE | Freq: Every day | CUTANEOUS | 3 refills | Status: DC

## 2022-12-22 NOTE — Telephone Encounter (Signed)
Contacted [facility] and [facility] [address]. Neither pharmacy has the silver hydrogel in stock. [facility] states that they have something similar called Medi Honey Wound & Burn Gel. Would this be appropriate to send in for the patient?

## 2022-12-22 NOTE — Telephone Encounter (Signed)
Soni with [facility] is calling in because pt has a wound on his lower left leg. Soni says it's not deep but he wanted to know if Jolene wanted him to come in sooner than his scheduled appointment. Soni says pt isn't willing to do PT because of the pain in that leg and wants to know if Jolene wanted to pause the PT.

## 2022-12-23 ENCOUNTER — Telehealth: Payer: Self-pay | Admitting: Nurse Practitioner

## 2022-12-23 ENCOUNTER — Ambulatory Visit: Payer: Self-pay

## 2022-12-23 DIAGNOSIS — Z992 Dependence on renal dialysis: Secondary | ICD-10-CM | POA: Diagnosis not present

## 2022-12-23 DIAGNOSIS — N186 End stage renal disease: Secondary | ICD-10-CM | POA: Diagnosis not present

## 2022-12-23 DIAGNOSIS — N2581 Secondary hyperparathyroidism of renal origin: Secondary | ICD-10-CM | POA: Diagnosis not present

## 2022-12-23 NOTE — Patient Outreach (Signed)
Care Coordination   Follow Up Visit Note   12/23/2022 Name: Olson Fackrell MRN: [id number] DOB: [birth year]  Bradin Hardin is a 62 y.o. year old male who sees Aura Dials T, NP for primary care. I spoke with  Isac Sarna by phone today.  What matters to the patients health and wellness today?  Patients niece is requesting contact information to follow up with the CAP program.    Goals Addressed             This Visit's Progress    Care Coordination Activities       Interventions Today    Flowsheet Row Most Recent Value  General Interventions   General Interventions Discussed/Reviewed General Interventions Discussed, General Interventions Reviewed, Community Resources  [place] Langley Gauss reports no update on CAP application. SW informed there is a waiting period and provided the contact number to follow up with CAP directly.Pt has 10 hr wk PT and family helps.]               SDOH assessments and interventions completed:  No     Care Coordination Interventions:  Yes, provided   Follow up plan: No further intervention required.   Encounter Outcome:  Patient Visit Completed

## 2022-12-23 NOTE — Telephone Encounter (Signed)
Adam Howell from [facility] called to see if the gel form for leptospermum manuka honey (MEDIHONEY) PSTE paste would be ok as the paste in on backorder. Please f/u with pharmacy.

## 2022-12-23 NOTE — Telephone Encounter (Signed)
Patient states he was able to pick up the medication.

## 2022-12-23 NOTE — Telephone Encounter (Signed)
Patient notified

## 2022-12-23 NOTE — Telephone Encounter (Signed)
Called and notified Soni of Adam Howell's message. Soni asked for [place] to call and let him know how long to hold off on the PT after we see the patient in the morning. I advised him that we would call back after his appointment.

## 2022-12-23 NOTE — Telephone Encounter (Signed)
Pharmacy notified of Jolene's response. Will call to notify patient.

## 2022-12-24 ENCOUNTER — Encounter: Payer: Self-pay | Admitting: Nurse Practitioner

## 2022-12-24 ENCOUNTER — Telehealth: Payer: Self-pay | Admitting: Nurse Practitioner

## 2022-12-24 ENCOUNTER — Other Ambulatory Visit: Payer: Self-pay

## 2022-12-24 ENCOUNTER — Emergency Department
Admission: EM | Admit: 2022-12-24 | Discharge: 2022-12-24 | Disposition: A | Discharge status: Home / Self Care | Payer: Medicare HMO | Attending: Emergency Medicine | Admitting: Emergency Medicine

## 2022-12-24 ENCOUNTER — Ambulatory Visit (INDEPENDENT_AMBULATORY_CARE_PROVIDER_SITE_OTHER): Payer: Medicare HMO | Admitting: Nurse Practitioner

## 2022-12-24 VITALS — BP 115/67 | HR 75 | Temp 97.6°F

## 2022-12-24 DIAGNOSIS — Z992 Dependence on renal dialysis: Secondary | ICD-10-CM | POA: Diagnosis not present

## 2022-12-24 DIAGNOSIS — L97921 Non-pressure chronic ulcer of unspecified part of left lower leg limited to breakdown of skin: Secondary | ICD-10-CM

## 2022-12-24 DIAGNOSIS — L03116 Cellulitis of left lower limb: Secondary | ICD-10-CM | POA: Diagnosis not present

## 2022-12-24 DIAGNOSIS — R0902 Hypoxemia: Secondary | ICD-10-CM | POA: Diagnosis not present

## 2022-12-24 DIAGNOSIS — R9431 Abnormal electrocardiogram [ECG] [EKG]: Secondary | ICD-10-CM | POA: Diagnosis not present

## 2022-12-24 DIAGNOSIS — L089 Local infection of the skin and subcutaneous tissue, unspecified: Secondary | ICD-10-CM | POA: Diagnosis present

## 2022-12-24 DIAGNOSIS — N186 End stage renal disease: Secondary | ICD-10-CM | POA: Diagnosis not present

## 2022-12-24 LAB — CBC WITH DIFFERENTIAL/PLATELET
Abs Immature Granulocytes: 0.01 10*3/uL (ref 0.00–0.07)
Basophils Absolute: 0.1 10*3/uL (ref 0.0–0.1)
Basophils Relative: 1 %
Eosinophils Absolute: 0.3 10*3/uL (ref 0.0–0.5)
Eosinophils Relative: 5 %
HCT: 52.4 % — ABNORMAL HIGH (ref 39.0–52.0)
Hemoglobin: 17.6 g/dL — ABNORMAL HIGH (ref 13.0–17.0)
Immature Granulocytes: 0 %
Lymphocytes Relative: 25 %
Lymphs Abs: 1.3 10*3/uL (ref 0.7–4.0)
MCH: 29.6 pg (ref 26.0–34.0)
MCHC: 33.6 g/dL (ref 30.0–36.0)
MCV: 88.2 fL (ref 80.0–100.0)
Monocytes Absolute: 0.5 10*3/uL (ref 0.1–1.0)
Monocytes Relative: 9 %
Neutro Abs: 3.1 10*3/uL (ref 1.7–7.7)
Neutrophils Relative %: 60 %
Platelets: 176 10*3/uL (ref 150–400)
RBC: 5.94 MIL/uL — ABNORMAL HIGH (ref 4.22–5.81)
RDW: 15.4 % (ref 11.5–15.5)
WBC: 5.2 10*3/uL (ref 4.0–10.5)
nRBC: 0 % (ref 0.0–0.2)

## 2022-12-24 LAB — COMPREHENSIVE METABOLIC PANEL
ALT: 11 U/L (ref 0–44)
AST: 15 U/L (ref 15–41)
Albumin: 3.7 g/dL (ref 3.5–5.0)
Alkaline Phosphatase: 49 U/L (ref 38–126)
Anion gap: 16 — ABNORMAL HIGH (ref 5–15)
BUN: 27 mg/dL — ABNORMAL HIGH (ref 8–23)
CO2: 29 mmol/L (ref 22–32)
Calcium: 8.3 mg/dL — ABNORMAL LOW (ref 8.9–10.3)
Chloride: 89 mmol/L — ABNORMAL LOW (ref 98–111)
Creatinine, Ser: 7.91 mg/dL — ABNORMAL HIGH (ref 0.61–1.24)
GFR, Estimated: 7 mL/min — ABNORMAL LOW (ref >60)
Glucose, Bld: 89 mg/dL (ref 70–99)
Potassium: 5 mmol/L (ref 3.5–5.1)
Sodium: 134 mmol/L — ABNORMAL LOW (ref 135–145)
Total Bilirubin: 0.8 mg/dL (ref 0.3–1.2)
Total Protein: 8.4 g/dL — ABNORMAL HIGH (ref 6.5–8.1)

## 2022-12-24 LAB — LACTIC ACID, PLASMA: Lactic Acid, Venous: 1.6 mmol/L (ref 0.5–1.9)

## 2022-12-24 MED ORDER — DOXYCYCLINE HYCLATE 50 MG PO CAPS: 100.0000 mg | ORAL_CAPSULE | Freq: Two times a day (BID) | ORAL | 0 refills | Status: AC

## 2022-12-24 MED ORDER — DOXYCYCLINE HYCLATE 100 MG PO TABS
100.0000 mg | ORAL_TABLET | Freq: Once | ORAL | Status: AC
  Administered 2022-12-24: 100 mg via ORAL
  Filled 2022-12-24: qty 1

## 2022-12-24 MED ORDER — HYDROCODONE-ACETAMINOPHEN 5-325 MG PO TABS
1.0000 | ORAL_TABLET | Freq: Once | ORAL | Status: AC
  Administered 2022-12-24: 1 via ORAL
  Filled 2022-12-24: qty 1

## 2022-12-24 NOTE — Telephone Encounter (Signed)
Contacted patient's niece Langley Gauss and informed her that the patient was sent to the ER but was unable to take his wheelchair. Ms. Alinda Money agreed to come obtain the patient's wheelchair after picking the patient up from the ER. Ms. Alinda Money also requested assistance with getting the patient more than 10 hours a week for in home health and a chair/stool that can be used in the shower for bathing. Ms. Alinda Money states that she lives on the third floor and that it is difficult to take care of Mr. Samson without having the help that she needs to assist with bathing, cooking, etc. Ms. Alinda Money would like to be contacted as soon as possible to discuss. She can be reached at [phone].

## 2022-12-24 NOTE — Assessment & Plan Note (Signed)
Ongoing for 2 months per patient and is worsening today with odor present.  Concern as history of ulcerations to right leg leading to AKA. Manuka Honey assisted in some debridement, but wound has become larger.  Unable to see wound clinic until the 29th and vascular until the 4th, both visits were rescheduled due to provider out of office.  Did complete Doxycycline.  At this time concerned with worsening wound and presence of odor.  Have recommended he go to ER today for further evaluation due to his past history of would and AKA right side, now similar to left.  He is agreeable to this plan and will have driver take him there.  Follow-up after ER.

## 2022-12-24 NOTE — Addendum Note (Signed)
Addended by: Aura Dials T on: 12/24/2022 04:58 PM   Modules accepted: Orders

## 2022-12-24 NOTE — ED Notes (Signed)
Pt is difficult stick. Pt stuck in triage unsuccessful. Lab called.

## 2022-12-24 NOTE — ED Triage Notes (Addendum)
Pt to ed from MD office via [facility] for a wound check. Py was at his PCP today to see them for same and they sent him here for further evaluation.  EMS Vitals 153/92 CBG 126 97.0 98% 68 HR Pt is caox4, in no acute distress. Pt has large wound on left anterior shin no bleeding noted, no swelling. Pt noticed it a couple of weeks ago. Pt is a diabetic, dialysis pt and BTK amputee of right leg.

## 2022-12-24 NOTE — ED Provider Notes (Signed)
[facility] Provider Note   Event Date/Time   First MD Initiated Contact with Patient 12/24/22 1217     (approximate) History  Wound Infection  HPI Adam Howell is a 62 y.o. male with a past medical history of end-stage renal disease on dialysis and right BKA who presents complaining of a wound to the anterior left lower extremity overlying the shin.  Patient states that this abnormality first started approximately 4 days prior to arrival and has been worsening since onset.  Patient states he has been using a topical cream that has seemed to worsen the wound including allowing it to be bigger and is now draining purulent drainage. ROS: Patient currently denies any vision changes, tinnitus, difficulty speaking, facial droop, sore throat, chest pain, shortness of breath, abdominal pain, nausea/vomiting/diarrhea, dysuria, or weakness/numbness/paresthesias in any extremity   Physical Exam  Triage Vital Signs: ED Triage Vitals [12/24/22 1052]  Encounter Vitals Group     BP (!) 140/83     Systolic BP Percentile      Diastolic BP Percentile      Pulse Rate 67     Resp 16     Temp 97.7 F (36.5 C)     Temp Source Oral     SpO2 100 %     Weight 265 lb (120.2 kg)     Height 6\' 3"  (1.905 m)     Head Circumference      Peak Flow      Pain Score 0     Pain Loc      Pain Education      Exclude from Growth Chart    Most recent vital signs: Vitals:   12/24/22 1052  BP: (!) 140/83  Pulse: 67  Resp: 16  Temp: 97.7 F (36.5 C)  SpO2: 100%   General: Awake, oriented x4. CV:  Good peripheral perfusion.  Resp:  Normal effort.  Abd:  No distention.  Other:  Middle-aged obese African-American male resting comfortably in no acute distress.  Right BKA.  There is a 4 cm x 5 cm diameter area of ulceration with exposed subcutaneous tissue and scattered areas of fluctuance including 1 actively draining portion ED Results / Procedures / Treatments  Labs (all labs  ordered are listed, but only abnormal results are displayed) Labs Reviewed  COMPREHENSIVE METABOLIC PANEL - Abnormal; Notable for the following components:      Result Value   Sodium 134 (*)    Chloride 89 (*)    BUN 27 (*)    Creatinine, Ser 7.91 (*)    Calcium 8.3 (*)    Total Protein 8.4 (*)    GFR, Estimated 7 (*)    Anion gap 16 (*)    All other components within normal limits  CBC WITH DIFFERENTIAL/PLATELET - Abnormal; Notable for the following components:   RBC 5.94 (*)    Hemoglobin 17.6 (*)    HCT 52.4 (*)    All other components within normal limits  BODY FLUID CULTURE W GRAM STAIN  LACTIC ACID, PLASMA  URINALYSIS, W/ REFLEX TO CULTURE (INFECTION SUSPECTED)  PROCEDURES: Critical Care performed: No .1-3 Lead EKG Interpretation  Performed by: Merwyn Katos, MD Authorized by: Merwyn Katos, MD     Interpretation: normal     ECG rate:  71   ECG rate assessment: normal     Rhythm: sinus rhythm     Ectopy: none     Conduction: normal    MEDICATIONS  ORDERED IN ED: Medications  HYDROcodone-acetaminophen (NORCO/VICODIN) 5-325 MG per tablet 1 tablet (has no administration in time range)  doxycycline (VIBRA-TABS) tablet 100 mg (has no administration in time range)   IMPRESSION / MDM / ASSESSMENT AND PLAN / ED COURSE  I reviewed the triage vital signs and the nursing notes.                             The patient is on the cardiac monitor to evaluate for evidence of arrhythmia and/or significant heart rate changes. Patient's presentation is most consistent with acute presentation with potential threat to life or bodily function. Presentation most consistent with simple cellulitis overlying what looks to be a developing diabetic ulceration due to poor peripheral blood flow. Given History, Exam, and Workup I have low suspicion for Necrotizing Fasciitis, Abscess, Osteomyelitis, DVT or other emergent problem as a cause for this presentation.  Rx: Doxycycline 100 mg twice  daily x5 days  Disposition: Discharge. No evidence of serious bacterial illness. Nontoxic appearing, VSS. Low risk for treatment failure based on history. Strict return precautions discussed with patient with full understanding. Advised patient to follow up promptly with primary care provider within next 48 hours and follow-up with wound care as soon as possible.   FINAL CLINICAL IMPRESSION(S) / ED DIAGNOSES   Final diagnoses:  Cellulitis of left lower extremity   Rx / DC Orders   ED Discharge Orders          Ordered    doxycycline (VIBRAMYCIN) 50 MG capsule  2 times daily        12/24/22 1311           Note:  This document was prepared using Dragon voice recognition software and may include unintentional dictation errors.   Merwyn Katos, MD 12/24/22 [phone]

## 2022-12-24 NOTE — Telephone Encounter (Signed)
Pt states he is in the ED.  They are giving him abx for his leg infection. Their plan is to send him home.  However, he wants to know if Adam Howell can call to the ED and have them keep him for a few days so he can have in patient therapy for a few days.

## 2022-12-24 NOTE — Progress Notes (Signed)
BP 115/67   Pulse 75   Temp 97.6 F (36.4 C) (Oral)   SpO2 100%    Subjective:    [name], male    DOB: 07/05/1960, 62 y.o.   MRN: [id number]  HPI: Saied Boldt is a 62 y.o. male  Chief Complaint  Patient presents with   Wound Check   WOUND LEFT LEG Had imaging with vascular on 12/17/22 and has visit on 01/18/23, his initial 12/25/22 visit had to be rescheduled. Is scheduled to see wound clinic on October 22nd -- missed initial visit with them.  Is seeing pain clinic on 01/01/23. Was to see pain clinic 12/13/32 for chronic leg pain, but did not attend appointment.  We have discussed at visits that chronic pain management is not done in this office and it is important to attend pain clinic visits.  We could not get Aquacel, not available.  Was able to get Manuka Honey, which he applied and reports this did assist in taking some of the top layer off. Duration: weeks Location: to left anterior shin History of trauma in area: no Pain: yes Quality: yes Severity: 6/10 Redness: yes Swelling: no Oozing: no Pus: no Fevers: no Nausea/vomiting: no Status: worse Treatments attempted:abx and  started manuka honey which has helped get the top layer of darker skin off  Relevant past medical, surgical, family and social history reviewed and updated as indicated. Interim medical history since our last visit reviewed. Allergies and medications reviewed and updated.  Review of Systems  Per HPI unless specifically indicated above     Objective:    BP 115/67   Pulse 75   Temp 97.6 F (36.4 C) (Oral)   SpO2 100%   Wt Readings from Last 3 Encounters:  08/27/22 250 lb (113.4 kg)  05/12/22 232 lb (105.2 kg)  04/04/22 231 lb 7.7 oz (105 kg)    Physical Exam Vitals and nursing note reviewed.  Constitutional:      General: He is awake. He is not in acute distress.    Appearance: He is well-developed and well-groomed. He is not ill-appearing or  toxic-appearing.  HENT:     Head: Normocephalic.     Right Ear: Hearing and external ear normal.     Left Ear: Hearing and external ear normal.  Eyes:     General: Lids are normal.     Extraocular Movements: Extraocular movements intact.     Conjunctiva/sclera: Conjunctivae normal.  Neck:     Thyroid: No thyromegaly.     Vascular: No carotid bruit.  Cardiovascular:     Rate and Rhythm: Normal rate and regular rhythm.     Pulses:          Dorsalis pedis pulses are 1+ on the left side.       Posterior tibial pulses are 1+ on the left side.     Heart sounds: Normal heart sounds. No murmur heard.    No gallop.     Comments: Diminished pulses to left foot noted.  Very dry skin around ankle and to foot left side. Pulmonary:     Effort: No accessory muscle usage or respiratory distress.     Breath sounds: Normal breath sounds.  Abdominal:     General: Bowel sounds are normal. There is no distension.     Palpations: Abdomen is soft.     Tenderness: There is no abdominal tenderness.  Musculoskeletal:     Cervical back: Full passive range of motion  without pain.     Right lower leg: No edema.     Left lower leg: No edema.     Left foot: Normal range of motion.     Right Lower Extremity: Right leg is amputated above knee.  Feet:     Left foot:     Protective Sensation: 10 sites tested.  6 sites sensed.     Skin integrity: Ulcer (to lower left leg) and dry skin present.     Toenail Condition: Left toenails are abnormally thick.  Lymphadenopathy:     Cervical: No cervical adenopathy.  Skin:    General: Skin is warm.     Capillary Refill: Capillary refill takes less than 2 seconds.     Findings: Wound present.     Comments: Wound pictures as below.  This is worse then previous check. Odor present to area.  Neurological:     Mental Status: [name] and oriented to person, place, and time.     Deep Tendon Reflexes: Reflexes are normal and symmetric.     Reflex Scores:       Brachioradialis reflexes are 2+ on the right side and 2+ on the left side.      Patellar reflexes are 2+ on the right side and 2+ on the left side. Psychiatric:        Attention and Perception: Attention normal.        Mood and Affect: Mood normal.        Speech: Speech normal.        Behavior: Behavior normal. Behavior is cooperative.        Thought Content: Thought content normal.        Results for orders placed or performed in visit on 10/12/22  Bayer DCA Hb A1c Waived  Result Value Ref Range   HB A1C (BAYER DCA - WAIVED) 5.9 (H) 4.8 - 5.6 %      Assessment & Plan:   Problem List Items Addressed This Visit       Musculoskeletal and Integument   Ulcer of left lower extremity, limited to breakdown of skin (HCC) - Primary    Ongoing for 2 months per patient and is worsening today with odor present.  Concern as history of ulcerations to right leg leading to AKA. Manuka Honey assisted in some debridement, but wound has become larger.  Unable to see wound clinic until the 29th and vascular until the 4th, both visits were rescheduled due to provider out of office.  Did complete Doxycycline.  At this time concerned with worsening wound and presence of odor.  Have recommended he go to ER today for further evaluation due to his past history of would and AKA right side, now similar to left.  He is agreeable to this plan and will have driver take him there.  Follow-up after ER.       Time: 30 minutes, >50% spent counseling/or care coordination   Follow up plan: Return for follow-up after ER.

## 2022-12-25 ENCOUNTER — Ambulatory Visit (INDEPENDENT_AMBULATORY_CARE_PROVIDER_SITE_OTHER): Payer: Medicare HMO | Admitting: Nurse Practitioner

## 2022-12-25 DIAGNOSIS — N2581 Secondary hyperparathyroidism of renal origin: Secondary | ICD-10-CM | POA: Diagnosis not present

## 2022-12-25 DIAGNOSIS — Z992 Dependence on renal dialysis: Secondary | ICD-10-CM | POA: Diagnosis not present

## 2022-12-25 DIAGNOSIS — N186 End stage renal disease: Secondary | ICD-10-CM | POA: Diagnosis not present

## 2022-12-27 DIAGNOSIS — Z992 Dependence on renal dialysis: Secondary | ICD-10-CM | POA: Diagnosis not present

## 2022-12-27 DIAGNOSIS — N186 End stage renal disease: Secondary | ICD-10-CM | POA: Diagnosis not present

## 2022-12-27 DIAGNOSIS — L03116 Cellulitis of left lower limb: Secondary | ICD-10-CM | POA: Diagnosis not present

## 2022-12-27 DIAGNOSIS — I120 Hypertensive chronic kidney disease with stage 5 chronic kidney disease or end stage renal disease: Secondary | ICD-10-CM | POA: Diagnosis not present

## 2022-12-27 DIAGNOSIS — G8929 Other chronic pain: Secondary | ICD-10-CM | POA: Diagnosis not present

## 2022-12-27 DIAGNOSIS — Z89611 Acquired absence of right leg above knee: Secondary | ICD-10-CM | POA: Diagnosis not present

## 2022-12-27 DIAGNOSIS — E1122 Type 2 diabetes mellitus with diabetic chronic kidney disease: Secondary | ICD-10-CM | POA: Diagnosis not present

## 2022-12-27 DIAGNOSIS — E1151 Type 2 diabetes mellitus with diabetic peripheral angiopathy without gangrene: Secondary | ICD-10-CM | POA: Diagnosis not present

## 2022-12-27 DIAGNOSIS — L97821 Non-pressure chronic ulcer of other part of left lower leg limited to breakdown of skin: Secondary | ICD-10-CM | POA: Diagnosis not present

## 2022-12-27 NOTE — Patient Instructions (Signed)

## 2022-12-28 ENCOUNTER — Telehealth: Payer: Self-pay | Admitting: *Deleted

## 2022-12-28 ENCOUNTER — Telehealth: Payer: Self-pay | Admitting: Nurse Practitioner

## 2022-12-28 NOTE — Telephone Encounter (Signed)
Called Rinaldo Cloud and gave verbal orders per provider

## 2022-12-28 NOTE — Progress Notes (Signed)
Care Coordination   Note   12/28/2022 Name: Adam Howell MRN: [id number] DOB: Jan 02, 1961  Adam Howell is a 62 y.o. year old male who sees Aura Dials T, NP for primary care. I reached out to Adam Howell by phone today to offer care coordination services.  Adam Howell was given information about Care Coordination services today including:   The Care Coordination services include support from the care team which includes your Nurse Coordinator, Clinical Social Worker, or Pharmacist.  The Care Coordination team is here to help remove barriers to the health concerns and goals most important to you. Care Coordination services are voluntary, and the patient may decline or stop services at any time by request to their care team member.   Care Coordination Consent Status: Patient agreed to services and verbal consent obtained.   Follow up plan:  Telephone appointment with care coordination team member scheduled for:  12/29/2022  Encounter Outcome:  Patient Scheduled from referral   Adam Howell, [facility] Care Coordination Care Guide Direct Dial: [phone]

## 2022-12-28 NOTE — Telephone Encounter (Unsigned)
Copied from CRM [id number]. Topic: Referral - Status >> Dec 28, 2022  3:23 PM Everette C wrote: Reason for CRM: The patient has called to follow up on their previously discussed referral for pain management   The patient shares that a referral for ongoing discomfort was discussed at their last appointment   Please contact further when possible

## 2022-12-28 NOTE — Telephone Encounter (Signed)
Home Health Verbal Orders - Caller/Agency: Rinaldo Cloud, RN, with [facility]   Callback Number: [phone] (SECURED VM)  Requesting Skilled Nursing   Frequency:  2 week 2 1 week 7 2 PRN VISITS

## 2022-12-29 ENCOUNTER — Ambulatory Visit (INDEPENDENT_AMBULATORY_CARE_PROVIDER_SITE_OTHER): Payer: Medicare HMO

## 2022-12-29 ENCOUNTER — Encounter: Payer: Self-pay | Admitting: *Deleted

## 2022-12-29 ENCOUNTER — Ambulatory Visit: Payer: Self-pay | Admitting: *Deleted

## 2022-12-29 LAB — BODY FLUID CULTURE W GRAM STAIN: Gram Stain: NONE SEEN

## 2022-12-29 NOTE — Telephone Encounter (Signed)
Left message for patient to give our office a call back to discuss Adam Howell's recommendations.   OK for PEC to give note if patient calls back.

## 2022-12-29 NOTE — Telephone Encounter (Signed)
This encounter was created in error - please disregard.

## 2022-12-29 NOTE — Patient Instructions (Addendum)
Visit Information  Thank you for taking time to visit with me today. Please don't hesitate to contact me if I can be of assistance to you.   Following are the goals we discussed today:   Goals Addressed             This Visit's Progress    referral to the [facility]  as well as short term skilled care       Activities and task to complete in order to accomplish goals.   REFERRAL PLACED BY SOCIAL WORK  I have complete the referral for the [facility] CAP  to complete referral and establish care.  Look for a packet in the mail from [place] to sign and return to your providers office Please contact [facility] to discuss options for short term rehab (if recommended)         Our next appointment is by telephone on 01/06/23 at 1pm  Please call the care guide team at [phone] if you need to cancel or reschedule your appointment.   If you are experiencing a Mental Health or Behavioral Health Crisis or need someone to talk to, please call 911   Patient verbalizes understanding of instructions and care plan provided today and agrees to view in MyChart. Active MyChart status and patient understanding of how to access instructions and care plan via MyChart confirmed with patient.     Telephone follow up appointment with care management team member scheduled for: 12/07/22   Verna Czech, LCSW   [facility], [facility] Licensed Clinical Social Worker Care Coordinator  Direct Dial: [phone]

## 2022-12-29 NOTE — Patient Outreach (Addendum)
Care Coordination   Initial Visit Note   12/29/2022 Name: Adam Howell MRN: [id number] DOB: 1960/03/29  Adam Howell is a 62 y.o. year old male who sees Aura Dials T, NP for primary care. I spoke with  Adam Howell and his niece Adam Howell by phone today.  What matters to the patients health and wellness today?  Patient and niece interested in referral to the [facility] for ongoing in home care. Patient also agreeable to short term rehab-patient's niece agrees to discuss possible recommendation with [facility].   Goals Addressed             This Visit's Progress    referral to the [facility]  as well as short term skilled care       Activities and task to complete in order to accomplish goals.   REFERRAL PLACED BY SOCIAL WORK  I have complete the referral for the [facility] CAP  to complete referral and establish care.  Look for a packet in the mail from [place] to sign and return to your providers office Please contact [facility] to discuss options for short term rehab (if recommended)         SDOH assessments and interventions completed:  Yes  SDOH Interventions Today    Flowsheet Row Most Recent Value  SDOH Interventions   Food Insecurity Interventions Intervention Not Indicated  Housing Interventions Intervention Not Indicated  Transportation Interventions Other (Comment)  [medicaid transportation]  Social Connections Interventions Intervention Not Indicated        Care Coordination Interventions:  Yes, provided  Interventions Today    Flowsheet Row Most Recent Value  Chronic Disease   Chronic disease during today's visit Hypertension (HTN), Diabetes  General Interventions   General Interventions Discussed/Reviewed General Interventions Discussed, Community Resources, Level of Care  [confirmed that patient resides with niece who assists with his ADL's in a 3rd floor  apt, current plan is to move to a 1 level in approx 3 months-pt currenlty receives 10 hours per week in personal cafre services]  Level of Care Personal Care Services, Skilled Nursing Facility  [confirmed that pt  is receives 10 hours per week of personal care services, pt and family confirm need for additional hours-CAP services discussed, referral completed -niece also interested in short term rehab-auth process discussed]  Exercise Interventions   Exercise Discussed/Reviewed Exercise Discussed  [confirmed that patient is  active with [facility] for PT]  Education Interventions   Education Provided Provided Education  Provided Verbal Education On [employer], Other  [community alterntive program referral process as well as placement process in short term rehab]       Follow up plan: Follow up call scheduled for 01/06/23    Encounter Outcome:  Patient Visit Completed

## 2022-12-30 DIAGNOSIS — Z992 Dependence on renal dialysis: Secondary | ICD-10-CM | POA: Diagnosis not present

## 2022-12-30 DIAGNOSIS — N186 End stage renal disease: Secondary | ICD-10-CM | POA: Diagnosis not present

## 2022-12-30 DIAGNOSIS — N2581 Secondary hyperparathyroidism of renal origin: Secondary | ICD-10-CM | POA: Diagnosis not present

## 2022-12-30 NOTE — Progress Notes (Signed)
ED Antimicrobial Stewardship Positive Culture Follow Up   Jaggar Benko is an 62 y.o. male who presented to [facility] on 12/24/2022 with a chief complaint of wound check. Patient was sent from their MD office to ED due to worsening of wound and increased odor. Cultures were taken of the wound and the patient was discharged on 5 days of Doxycycline.   Chief Complaint  Patient presents with   Wound Infection    Recent Results (from the past 720 hour(s))  Body fluid culture w Gram Stain     Status: None   Collection Time: 12/24/22  1:18 PM   Specimen: Wound; Body Fluid  Result Value Ref Range Status   Specimen Description   Final    WOUND Performed at [facility], [address] [place], [place] [address]    Special Requests RECEIVED SWAB  Final   Gram Stain   Final    NO WBC SEEN NO ORGANISMS SEEN Performed at [facility], [address], [place], [place] [address]    Culture   Final    MODERATE CORYNEBACTERIUM AMYCOLATUM Standardized susceptibility testing for this organism is not available. RARE ENTEROBACTER CLOACAE RARE ENTEROCOCCUS FAECALIS FEW STAPHYLOCOCCUS EPIDERMIDIS    Report Status 12/29/2022 FINAL  Final   Organism ID, Bacteria STAPHYLOCOCCUS EPIDERMIDIS  Final   Organism ID, Bacteria ENTEROBACTER CLOACAE  Final   Organism ID, Bacteria ENTEROCOCCUS FAECALIS  Final      Susceptibility   Enterobacter cloacae - MIC*    CEFEPIME <=0.12 SENSITIVE Sensitive     CEFTAZIDIME <=1 SENSITIVE Sensitive     CIPROFLOXACIN <=0.25 SENSITIVE Sensitive     GENTAMICIN <=1 SENSITIVE Sensitive     IMIPENEM <=0.25 SENSITIVE Sensitive     TRIMETH/SULFA <=20 SENSITIVE Sensitive     PIP/TAZO <=4 SENSITIVE Sensitive ug/mL    * RARE ENTEROBACTER CLOACAE   Enterococcus faecalis - MIC*    AMPICILLIN <=2 SENSITIVE Sensitive     VANCOMYCIN 1 SENSITIVE Sensitive     GENTAMICIN SYNERGY RESISTANT Resistant     * RARE ENTEROCOCCUS FAECALIS   Staphylococcus  epidermidis - MIC*    CIPROFLOXACIN <=0.5 SENSITIVE Sensitive     ERYTHROMYCIN <=0.25 SENSITIVE Sensitive     GENTAMICIN <=0.5 SENSITIVE Sensitive     OXACILLIN <=0.25 SENSITIVE Sensitive     TETRACYCLINE <=1 SENSITIVE Sensitive     VANCOMYCIN 1 SENSITIVE Sensitive     TRIMETH/SULFA <=10 SENSITIVE Sensitive     CLINDAMYCIN <=0.25 SENSITIVE Sensitive     RIFAMPIN <=0.5 SENSITIVE Sensitive     Inducible Clindamycin NEGATIVE Sensitive     * FEW STAPHYLOCOCCUS EPIDERMIDIS    [x]  Treated with Doxycycline, organism resistant to prescribed antimicrobial []  Patient discharged originally without antimicrobial agent and treatment is now indicated  New antibiotic prescription recommended:  Bactrim SS Q12H  Amoxicillin 500 mg Q12H   Provider: Aura Dials, NP  Effie Shy, PharmD Pharmacy Resident  12/30/2022 9:58 AM

## 2022-12-31 ENCOUNTER — Ambulatory Visit: Payer: Medicare HMO | Admitting: Nurse Practitioner

## 2022-12-31 ENCOUNTER — Encounter: Payer: Self-pay | Admitting: Nurse Practitioner

## 2022-12-31 ENCOUNTER — Telehealth: Payer: Self-pay | Admitting: *Deleted

## 2022-12-31 VITALS — BP 122/80 | HR 75 | Temp 97.7°F

## 2022-12-31 DIAGNOSIS — G894 Chronic pain syndrome: Secondary | ICD-10-CM | POA: Diagnosis not present

## 2022-12-31 DIAGNOSIS — L97921 Non-pressure chronic ulcer of unspecified part of left lower leg limited to breakdown of skin: Secondary | ICD-10-CM

## 2022-12-31 LAB — VAS US ABI WITH/WO TBI: Left ABI: 0.99

## 2022-12-31 MED ORDER — AMOXICILLIN 500 MG PO CAPS: 500.0000 mg | ORAL_CAPSULE | Freq: Two times a day (BID) | ORAL | 0 refills | Status: AC

## 2022-12-31 MED ORDER — HYDROCODONE-ACETAMINOPHEN 10-325 MG PO TABS
1.0000 | ORAL_TABLET | Freq: Three times a day (TID) | ORAL | 0 refills | Status: DC | PRN
Start: 2022-12-31 — End: 2023-01-14

## 2022-12-31 MED ORDER — SULFAMETHOXAZOLE-TRIMETHOPRIM 400-80 MG PO TABS: 1.0000 | ORAL_TABLET | Freq: Two times a day (BID) | ORAL | 0 refills | Status: AC

## 2022-12-31 MED ORDER — AMOXICILLIN 500 MG PO CAPS: 500.0000 mg | ORAL_CAPSULE | Freq: Two times a day (BID) | ORAL | 0 refills | Status: DC

## 2022-12-31 NOTE — Assessment & Plan Note (Signed)
Ongoing for 2 months per patient.  Slightly smaller today, but dark eschar present over wound bed  Concern as history of ulcerations to right leg leading to AKA. Continue wound care cream at home.  Will look into home health order for nursing, as he reports they have not come yet.  Is scheduled to see pain clinic, wound clinic, and vascular upcoming -- he is aware of importance of attending visits and ensuring a ride is scheduled 3 days before.  Alerted [facility] SW to check in on him and ensure he is scheduling ride.  Due to growth on wound sent in Bactrim SS and Amoxicillin as recommended by hospital [facility] (refer to HPI).

## 2022-12-31 NOTE — Patient Instructions (Signed)
Visit Information  Thank you for taking time to visit with me today. Please don't hesitate to contact me if I can be of assistance to you.   Following are the goals we discussed today:   Goals Addressed             This Visit's Progress    referral to the [facility]  as well as short term skilled care       Activities and task to complete in order to accomplish goals.   REFERRAL PLACED BY SOCIAL WORK  I have complete the referral for the [facility] CAP  to complete referral and establish care.  Look for a packet in the mail from [place] to sign and return to your providers office Please follow up with [facility] options for short term rehab (if recommended)         Our next appointment is by telephone on 01/06/23 at 1pm  Please call the care guide team at [phone] if you need to cancel or reschedule your appointment.   If you are experiencing a Mental Health or Behavioral Health Crisis or need someone to talk to, please call 911   Patient verbalizes understanding of instructions and care plan provided today and agrees to view in MyChart. Active MyChart status and patient understanding of how to access instructions and care plan via MyChart confirmed with patient.     Telephone follow up appointment with care management team member scheduled for: 01/06/23  Verna Czech, LCSW [place]  [facility], [facility] Licensed Clinical Social Worker Care Coordinator  Direct Dial: [phone]

## 2022-12-31 NOTE — Patient Outreach (Signed)
Care Coordination   Follow Up Visit Note   12/31/2022 Name: Adam Howell MRN: [id number] DOB: 21-Dec-1960  Adam Howell is a 62 y.o. year old male who sees Aura Dials T, NP for primary care. I spoke with  Adam Howell by phone today.  What matters to the patients health and wellness today?  Extended hours for in home care, transportation to medical appointment's and short term rehab placement if indicated    Goals Addressed             This Visit's Progress    referral to the [facility]  as well as short term skilled care       Activities and task to complete in order to accomplish goals.   REFERRAL PLACED BY SOCIAL WORK  I have complete the referral for the [facility]  to complete referral and establish care.  Look for a packet in the mail from [place] to sign and return to your providers office Please follow up with [facility] options for short term rehab (if recommended)         SDOH assessments and interventions completed:  No     Care Coordination Interventions:  Yes, provided  Interventions Today    Flowsheet Row Most Recent Value  Chronic Disease   Chronic disease during today's visit Hypertension (HTN), Diabetes  General Interventions   General Interventions Discussed/Reviewed Communication with, Doctor Visits, [facility], General Interventions Reviewed, Level of Care  Doctor Visits Discussed/Reviewed Doctor Visits Reviewed, PCP  [10/22 [facility] wound care, PCP 10/29, [facility]-pain clinic 10/30, Vascular 11/4]  PCP/Specialist Visits Compliance with follow-up visit  Communication with PCP/Specialists  [Received request from patient's provider to assist with transportation to medical appts]  Level of Care Personal Care Services  [Confirmed CAP referral complete, patient to expect consent forms in the mail to sign and provide to PCP to complete]  Education Interventions    Education Provided Provided Education  Provided Verbal Education On [facility]  [confirmed plan to request Medicaid transportation to medical appts-CAP referral process discussed]       Follow up plan: Follow up call scheduled for 01/06/23    Encounter Outcome:  Patient Visit Completed

## 2022-12-31 NOTE — Progress Notes (Signed)
BP 122/80   Pulse 75   Temp 97.7 F (36.5 C) (Oral)    Subjective:    [name], male    DOB: 1960/07/27, 62 y.o.   MRN: [id number]  HPI: Adam Howell is a 62 y.o. male  Chief Complaint  Patient presents with   Wound Check   SKIN INFECTION Follow-up today for wound check.  Seen last 12/24/22 and sent to ER due to concerns about worsening wound and unable to attend wound clinic until end of October and vascular beginning of November.  He was sent home from ER and continued on Doxycyline.  A wound culture was obtained and Gara Kroner [facility] reached out to PCP to alert them that Doxycycline only covers one thing of growth, he also had E faecalis and E cloacae on swab -- she recommended start Bactrim DS Q12H and Amoxicillin 500 MG Q12H -- this is renal dosing.  Is to attend wound clinic on 01/05/23 and vascular on 01/18/23.  Placed home health order on 12/24/22 and he has not had them come in home yet.  He states currently putting wound cream on it he was given.  Was to see pain clinic on 01/01/23 and 12/13/32 for chronic leg pain, but did not attend appointment - they report he has missed 4 new patient appointment. We have discussed at visits that chronic pain management is not done at PCP office and it is important to attend pain clinic visits. Placed new referral recently. Duration: months Location: to left lower leg History of trauma in area: no Pain: yes Quality: yes Severity: 8/10 Redness: no Swelling: no Oozing: no Pus: no Fevers: no Nausea/vomiting: no Status: fluctuating Treatments attempted: abx therapy   Relevant past medical, surgical, family and social history reviewed and updated as indicated. Interim medical history since our last visit reviewed. Allergies and medications reviewed and updated.  Review of Systems  Constitutional:  Negative for activity change, diaphoresis, fatigue and fever.  Respiratory:  Negative for cough, chest  tightness, shortness of breath and wheezing.   Cardiovascular:  Negative for chest pain, palpitations and leg swelling.  Skin:  Positive for wound.  Neurological: Negative.   Psychiatric/Behavioral: Negative.      Per HPI unless specifically indicated above     Objective:    BP 122/80   Pulse 75   Temp 97.7 F (36.5 C) (Oral)   Wt Readings from Last 3 Encounters:  12/24/22 265 lb (120.2 kg)  08/27/22 250 lb (113.4 kg)  05/12/22 232 lb (105.2 kg)    Physical Exam Vitals and nursing note reviewed.  Constitutional:      General: He is awake. He is not in acute distress.    Appearance: He is well-developed and well-groomed. He is obese. He is not ill-appearing or toxic-appearing.  HENT:     Head: Normocephalic.     Right Ear: Hearing and external ear normal.     Left Ear: Hearing and external ear normal.  Eyes:     General: Lids are normal.     Extraocular Movements: Extraocular movements intact.     Conjunctiva/sclera: Conjunctivae normal.  Neck:     Thyroid: No thyromegaly.     Vascular: No carotid bruit.  Cardiovascular:     Rate and Rhythm: Normal rate and regular rhythm.     Heart sounds: Normal heart sounds. No murmur heard.    No gallop.     Comments: Diminished pulses to left DP and PT.  Pulmonary:     Effort: No accessory muscle usage or respiratory distress.     Breath sounds: Normal breath sounds.  Abdominal:     General: Bowel sounds are normal. There is no distension.     Palpations: Abdomen is soft.     Tenderness: There is no abdominal tenderness.  Musculoskeletal:     Cervical back: Full passive range of motion without pain.     Left lower leg: No edema.     Right Lower Extremity: Right leg is amputated above knee.  Lymphadenopathy:     Cervical: No cervical adenopathy.  Skin:    General: Skin is warm.     Capillary Refill: Capillary refill takes less than 2 seconds.     Findings: Wound present.     Comments: Wound pictures as below.  Is  improving on check today with some reduction in size, however a dark eschar is present over area. No redness or swelling. Skin very dry.  Neurological:     Mental Status: [name] and oriented to person, place, and time.     Deep Tendon Reflexes: Reflexes are normal and symmetric.     Reflex Scores:      Brachioradialis reflexes are 2+ on the right side and 2+ on the left side.      Patellar reflexes are 2+ on the right side and 2+ on the left side. Psychiatric:        Attention and Perception: Attention normal.        Mood and Affect: Mood normal.        Speech: Speech normal.        Behavior: Behavior normal. Behavior is cooperative.        Thought Content: Thought content normal.           Results for orders placed or performed during the hospital encounter of 12/24/22  Body fluid culture w Gram Stain   Specimen: Wound; Body Fluid  Result Value Ref Range   Specimen Description      WOUND Performed at [facility], [address] [place], [place] [address]    Special Requests RECEIVED SWAB    Gram Stain      NO WBC SEEN NO ORGANISMS SEEN Performed at [facility], [address] [place], [place] [address]    Culture      MODERATE CORYNEBACTERIUM AMYCOLATUM Standardized susceptibility testing for this organism is not available. RARE ENTEROBACTER CLOACAE RARE ENTEROCOCCUS FAECALIS FEW STAPHYLOCOCCUS EPIDERMIDIS    Report Status 12/29/2022 FINAL    Organism ID, Bacteria STAPHYLOCOCCUS EPIDERMIDIS    Organism ID, Bacteria ENTEROBACTER CLOACAE    Organism ID, Bacteria ENTEROCOCCUS FAECALIS       Susceptibility   Enterobacter cloacae - MIC*    CEFEPIME <=0.12 SENSITIVE Sensitive     CEFTAZIDIME <=1 SENSITIVE Sensitive     CIPROFLOXACIN <=0.25 SENSITIVE Sensitive     GENTAMICIN <=1 SENSITIVE Sensitive     IMIPENEM <=0.25 SENSITIVE Sensitive     TRIMETH/SULFA <=20 SENSITIVE Sensitive     PIP/TAZO <=4 SENSITIVE Sensitive ug/mL    * RARE ENTEROBACTER  CLOACAE   Enterococcus faecalis - MIC*    AMPICILLIN <=2 SENSITIVE Sensitive     VANCOMYCIN 1 SENSITIVE Sensitive     GENTAMICIN SYNERGY RESISTANT Resistant     * RARE ENTEROCOCCUS FAECALIS   Staphylococcus epidermidis - MIC*    CIPROFLOXACIN <=0.5 SENSITIVE Sensitive     ERYTHROMYCIN <=0.25 SENSITIVE Sensitive     GENTAMICIN <=  0.5 SENSITIVE Sensitive     OXACILLIN <=0.25 SENSITIVE Sensitive     TETRACYCLINE <=1 SENSITIVE Sensitive     VANCOMYCIN 1 SENSITIVE Sensitive     TRIMETH/SULFA <=10 SENSITIVE Sensitive     CLINDAMYCIN <=0.25 SENSITIVE Sensitive     RIFAMPIN <=0.5 SENSITIVE Sensitive     Inducible Clindamycin NEGATIVE Sensitive     * FEW STAPHYLOCOCCUS EPIDERMIDIS  Lactic acid, plasma  Result Value Ref Range   Lactic Acid, Venous 1.6 0.5 - 1.9 mmol/L  Comprehensive metabolic panel  Result Value Ref Range   Sodium 134 (L) 135 - 145 mmol/L   Potassium 5.0 3.5 - 5.1 mmol/L   Chloride 89 (L) 98 - 111 mmol/L   CO2 29 22 - 32 mmol/L   Glucose, Bld 89 70 - 99 mg/dL   BUN 27 (H) 8 - 23 mg/dL   Creatinine, Ser 1.61 (H) 0.61 - 1.24 mg/dL   Calcium 8.3 (L) 8.9 - 10.3 mg/dL   Total Protein 8.4 (H) 6.5 - 8.1 g/dL   Albumin 3.7 3.5 - 5.0 g/dL   AST 15 15 - 41 U/L   ALT 11 0 - 44 U/L   Alkaline Phosphatase 49 38 - 126 U/L   Total Bilirubin 0.8 0.3 - 1.2 mg/dL   GFR, Estimated 7 (L) >60 mL/min   Anion gap 16 (H) 5 - 15  CBC with Differential  Result Value Ref Range   WBC 5.2 4.0 - 10.5 K/uL   RBC 5.94 (H) 4.22 - 5.81 MIL/uL   Hemoglobin 17.6 (H) 13.0 - 17.0 g/dL   HCT 09.6 (H) 04.5 - 40.9 %   MCV 88.2 80.0 - 100.0 fL   MCH 29.6 26.0 - 34.0 pg   MCHC 33.6 30.0 - 36.0 g/dL   RDW 81.1 91.4 - 78.2 %   Platelets 176 150 - 400 K/uL   nRBC 0.0 0.0 - 0.2 %   Neutrophils Relative % 60 %   Neutro Abs 3.1 1.7 - 7.7 K/uL   Lymphocytes Relative 25 %   Lymphs Abs 1.3 0.7 - 4.0 K/uL   Monocytes Relative 9 %   Monocytes Absolute 0.5 0.1 - 1.0 K/uL   Eosinophils Relative 5 %    Eosinophils Absolute 0.3 0.0 - 0.5 K/uL   Basophils Relative 1 %   Basophils Absolute 0.1 0.0 - 0.1 K/uL   Immature Granulocytes 0 %   Abs Immature Granulocytes 0.01 0.00 - 0.07 K/uL      Assessment & Plan:   Problem List Items Addressed This Visit       Musculoskeletal and Integument   Ulcer of left lower extremity, limited to breakdown of skin (HCC) - Primary    Ongoing for 2 months per patient.  Slightly smaller today, but dark eschar present over wound bed  Concern as history of ulcerations to right leg leading to AKA. Continue wound care cream at home.  Will look into home health order for nursing, as he reports they have not come yet.  Is scheduled to see pain clinic, wound clinic, and vascular upcoming -- he is aware of importance of attending visits and ensuring a ride is scheduled 3 days before.  Alerted [facility] SW to check in on him and ensure he is scheduling ride.  Due to growth on wound sent in Bactrim SS and Amoxicillin as recommended by hospital [facility] (refer to HPI).        Other   Chronic pain syndrome    Ongoing, taking increased Norco  due to recent AKA right side and current left leg pain with wound.  Controlled substance contract up to date, he is now living with niece -- prior was living with daughter who was taking his pain medication.  Scheduled to see pain clinic on 30th at [facility] in [place], he is aware he can not miss this appointment as they may not reschedule since he has missed 4 new patient visits.  We have discussed that PCP does not perform chronic pain management in office.  Will send in short supply of his Norco for now to make it to 30th pain clinic visit.      Relevant Medications   HYDROcodone-acetaminophen (NORCO) 10-325 MG tablet     Follow up plan: Return in about 1 week (around 01/07/2023) for Wound Check.

## 2022-12-31 NOTE — Assessment & Plan Note (Signed)
Ongoing, taking increased Norco due to recent AKA right side and current left leg pain with wound.  Controlled substance contract up to date, he is now living with niece -- prior was living with daughter who was taking his pain medication.  Scheduled to see pain clinic on 30th at [facility] in [place], he is aware he can not miss this appointment as they may not reschedule since he has missed 4 new patient visits.  We have discussed that PCP does not perform chronic pain management in office.  Will send in short supply of his Norco for now to make it to 30th pain clinic visit.

## 2023-01-01 DIAGNOSIS — N2581 Secondary hyperparathyroidism of renal origin: Secondary | ICD-10-CM | POA: Diagnosis not present

## 2023-01-01 DIAGNOSIS — L03116 Cellulitis of left lower limb: Secondary | ICD-10-CM | POA: Diagnosis not present

## 2023-01-01 DIAGNOSIS — Z89611 Acquired absence of right leg above knee: Secondary | ICD-10-CM | POA: Diagnosis not present

## 2023-01-01 DIAGNOSIS — I120 Hypertensive chronic kidney disease with stage 5 chronic kidney disease or end stage renal disease: Secondary | ICD-10-CM | POA: Diagnosis not present

## 2023-01-01 DIAGNOSIS — N186 End stage renal disease: Secondary | ICD-10-CM | POA: Diagnosis not present

## 2023-01-01 DIAGNOSIS — Z992 Dependence on renal dialysis: Secondary | ICD-10-CM | POA: Diagnosis not present

## 2023-01-01 DIAGNOSIS — E1151 Type 2 diabetes mellitus with diabetic peripheral angiopathy without gangrene: Secondary | ICD-10-CM | POA: Diagnosis not present

## 2023-01-01 DIAGNOSIS — L97821 Non-pressure chronic ulcer of other part of left lower leg limited to breakdown of skin: Secondary | ICD-10-CM | POA: Diagnosis not present

## 2023-01-01 DIAGNOSIS — E1122 Type 2 diabetes mellitus with diabetic chronic kidney disease: Secondary | ICD-10-CM | POA: Diagnosis not present

## 2023-01-01 DIAGNOSIS — G8929 Other chronic pain: Secondary | ICD-10-CM | POA: Diagnosis not present

## 2023-01-04 DIAGNOSIS — Z992 Dependence on renal dialysis: Secondary | ICD-10-CM | POA: Diagnosis not present

## 2023-01-04 DIAGNOSIS — N186 End stage renal disease: Secondary | ICD-10-CM | POA: Diagnosis not present

## 2023-01-04 DIAGNOSIS — N2581 Secondary hyperparathyroidism of renal origin: Secondary | ICD-10-CM | POA: Diagnosis not present

## 2023-01-05 ENCOUNTER — Ambulatory Visit: Payer: Medicare HMO | Admitting: Physician Assistant

## 2023-01-05 ENCOUNTER — Encounter: Payer: Medicare HMO | Admitting: Physical Therapy

## 2023-01-05 DIAGNOSIS — N186 End stage renal disease: Secondary | ICD-10-CM | POA: Diagnosis not present

## 2023-01-05 DIAGNOSIS — Z89611 Acquired absence of right leg above knee: Secondary | ICD-10-CM | POA: Diagnosis not present

## 2023-01-05 DIAGNOSIS — E1151 Type 2 diabetes mellitus with diabetic peripheral angiopathy without gangrene: Secondary | ICD-10-CM | POA: Diagnosis not present

## 2023-01-05 DIAGNOSIS — I120 Hypertensive chronic kidney disease with stage 5 chronic kidney disease or end stage renal disease: Secondary | ICD-10-CM | POA: Diagnosis not present

## 2023-01-05 DIAGNOSIS — G8929 Other chronic pain: Secondary | ICD-10-CM | POA: Diagnosis not present

## 2023-01-05 DIAGNOSIS — L97821 Non-pressure chronic ulcer of other part of left lower leg limited to breakdown of skin: Secondary | ICD-10-CM | POA: Diagnosis not present

## 2023-01-05 DIAGNOSIS — Z992 Dependence on renal dialysis: Secondary | ICD-10-CM | POA: Diagnosis not present

## 2023-01-05 DIAGNOSIS — E1122 Type 2 diabetes mellitus with diabetic chronic kidney disease: Secondary | ICD-10-CM | POA: Diagnosis not present

## 2023-01-05 DIAGNOSIS — L03116 Cellulitis of left lower limb: Secondary | ICD-10-CM | POA: Diagnosis not present

## 2023-01-05 NOTE — Telephone Encounter (Signed)
Called and gave verbal orders per Jolene.  

## 2023-01-05 NOTE — Telephone Encounter (Signed)
Copied from CRM [id number]. Topic: General - Other >> Jan 05, 2023  9:34 AM Phill Myron wrote: Home Health Verbal Orders - Caller/Agency:   Trey Paula  with [facility]  Callback Number:  [phone]...  Requesting:      Physical therapy  to evaluate and change the wound care to use santyl ointment... 2xs a week

## 2023-01-06 ENCOUNTER — Ambulatory Visit: Payer: Self-pay | Admitting: *Deleted

## 2023-01-06 DIAGNOSIS — N186 End stage renal disease: Secondary | ICD-10-CM | POA: Diagnosis not present

## 2023-01-06 DIAGNOSIS — Z992 Dependence on renal dialysis: Secondary | ICD-10-CM | POA: Diagnosis not present

## 2023-01-06 DIAGNOSIS — N2581 Secondary hyperparathyroidism of renal origin: Secondary | ICD-10-CM | POA: Diagnosis not present

## 2023-01-06 NOTE — Patient Outreach (Signed)
Care Coordination   Follow Up Visit Note   01/06/2023 Name: Adam Howell MRN: [id number] DOB: June 09, 1960  Adam Howell is a 62 y.o. year old male who sees Aura Dials T, NP for primary care. I spoke with  Adam Howell's niece by phone today.  What matters to the patients health and wellness today? Paper work received for CAP services, niece requesting assistance with completion. CSW will meet patient at next PCP appt to assist. Medical appointments reviewed to confirm transportation arrangements have been made.    Goals Addressed             This Visit's Progress    referral to the [facility]  as well as short term skilled care       Activities and task to complete in order to accomplish goals.   REFERRAL PLACED BY SOCIAL WORK  I have complete the referral for the [facility]  to  establish care.  Please bring packet to your next PCP appt for assistance with completion           SDOH assessments and interventions completed:  No     Care Coordination Interventions:  Yes, provided  Interventions Today    Flowsheet Row Most Recent Value  Chronic Disease   Chronic disease during today's visit Hypertension (HTN)  General Interventions   General Interventions Discussed/Reviewed General Interventions Reviewed, [facility], Level of Care  Doctor Visits Discussed/Reviewed Doctor Visits Reviewed  [10/22 [facility] wound care, PCP 10/29, [facility] 10/30, Vascular 11/4]  PCP/Specialist Visits Compliance with follow-up visit  [confirmed that medicaid transportation has been arranged to medical appointments]  Level of Care Personal Care Services, Skilled Nursing Facility  [patient's niece confimed that she has received the paperwork for CAP services, request assistance in completing papework completed-niece confirms plan to delay short term rehab at this time-pt continues to receive [facility]]       Follow up plan: Follow up call scheduled for 01/12/23    Encounter Outcome:  Patient Visit Completed

## 2023-01-06 NOTE — Patient Instructions (Signed)
Visit Information  Thank you for taking time to visit with me today. Please don't hesitate to contact me if I can be of assistance to you.   Following are the goals we discussed today:   Goals Addressed             This Visit's Progress    referral to the [facility]  as well as short term skilled care       Activities and task to complete in order to accomplish goals.   REFERRAL PLACED BY SOCIAL WORK  I have complete the referral for the [facility]  to  establish care.  Please bring packet to your next PCP appt for assistance with completion           Our next appointment is by telephone on 01/12/23 at 2pm  Please call the care guide team at [phone] if you need to cancel or reschedule your appointment.   If you are experiencing a Mental Health or Behavioral Health Crisis or need someone to talk to, please call [phone]   Patient verbalizes understanding of instructions and care plan provided today and agrees to view in MyChart. Active MyChart status and patient understanding of how to access instructions and care plan via MyChart confirmed with patient.     Telephone follow up appointment with care management team member scheduled for: 01/12/23  Verna Czech, LCSW [facility], [facility] Licensed Clinical Social Worker Care Coordinator  Direct Dial: [phone]

## 2023-01-08 DIAGNOSIS — E1151 Type 2 diabetes mellitus with diabetic peripheral angiopathy without gangrene: Secondary | ICD-10-CM | POA: Diagnosis not present

## 2023-01-08 DIAGNOSIS — Z992 Dependence on renal dialysis: Secondary | ICD-10-CM | POA: Diagnosis not present

## 2023-01-08 DIAGNOSIS — L97821 Non-pressure chronic ulcer of other part of left lower leg limited to breakdown of skin: Secondary | ICD-10-CM | POA: Diagnosis not present

## 2023-01-08 DIAGNOSIS — Z89611 Acquired absence of right leg above knee: Secondary | ICD-10-CM | POA: Diagnosis not present

## 2023-01-08 DIAGNOSIS — L03116 Cellulitis of left lower limb: Secondary | ICD-10-CM | POA: Diagnosis not present

## 2023-01-08 DIAGNOSIS — E1122 Type 2 diabetes mellitus with diabetic chronic kidney disease: Secondary | ICD-10-CM | POA: Diagnosis not present

## 2023-01-08 DIAGNOSIS — N186 End stage renal disease: Secondary | ICD-10-CM | POA: Diagnosis not present

## 2023-01-08 DIAGNOSIS — G8929 Other chronic pain: Secondary | ICD-10-CM | POA: Diagnosis not present

## 2023-01-08 DIAGNOSIS — I120 Hypertensive chronic kidney disease with stage 5 chronic kidney disease or end stage renal disease: Secondary | ICD-10-CM | POA: Diagnosis not present

## 2023-01-10 NOTE — Patient Instructions (Signed)
Diabetes Mellitus and Skin Care Diabetes, also called diabetes mellitus, can lead to skin problems. If blood sugar (glucose) is not well controlled, it can cause problems over time. These problems include: Damage to nerves. This can affect your ability to feel wounds. This means you may not notice small skin injuries that could lead to bigger problems. This can also decrease the amount that you sweat, causing dry skin. Damage to blood vessels. The lack of blood flow can cause skin to break down. It can also slow healing time, which can lead to infections. Areas of skin that become thick or discolored. Common skin conditions There are certain skin conditions that often affect people with diabetes. These include: Dry skin. Thin skin. The skin on the feet may get thinner, break more easily, and heal more slowly than normal. Skin infections from bacteria. These include: Styes. These are infections near the eyelid. Boils. These are bumps filled with pus. Infected hair follicles. Infections of the skin around the nails. Fungal skin infections. These are most common in areas where skin rubs together, such as in the armpits or under the breasts. Common skin changes Diabetes can also cause the skin to change. You may develop: Dark, velvety markings on your skin. These may appear on your face, neck, armpits, inner thighs, and groin. Red, raised, scar-like tissue that may itch, feel painful, or become a wound. Blisters on your feet, toes, hands, or fingers. Thick, wax-like areas of skin. In most cases, these occur on the hands, forehead, or toes. Brown or red, ring-shaped or half-ring-shaped patches of skin on the ears or fingers. Pea-shaped, yellow bumps that may be itchy and have a red ring around them. This may affect your arms, feet, buttocks, and the top of your hands. Round, discolored patches of tan skin that do not hurt or itch. These may look like age spots. Supplies needed: Mild soap or  gentle skin cleanser. Lotion. How to care for dry, itchy skin Frequent high glucose levels can cause skin to become itchy. Poor blood circulation and skin infections can make dry skin worse. If you have dry, itchy skin: Avoid very hot showers and baths. Use mild soap and gentle skin cleansers. Do not use soap that is perfumed, harsh, or that dries your skin. Moisturizing soaps may help. Put on moisturizing lotion as soon as you finish bathing. Do not scratch dry skin. Scratching can expose skin to infection. If you have a rash or if your skin is very itchy, contact your health care provider. Skin that is red or covered in a rash may be a sign of an allergic reaction. Very itchy skin may mean that you need help to manage your diabetes better. You may also need treatment for an infection. General tips Most skin problems can be prevented or treated easily if caught early. Talk with your health care provider if you have any concerns. General tips include: Check your skin every day for cuts, bruises, redness, blisters, or sores, especially on your feet. If you cannot see the bottom of your feet, use a mirror or ask someone for help. Tell your health care provider if you have any of these injuries and if they are healing slowly. Keep your skin clean and dry. Do not use hot water. Moisturize your skin to prevent chapping. Keep your blood glucose levels within target range. Follow these instructions at home:  Take over-the-counter and prescription medicines only as told by your health care provider. This includes all diabetes medicines   you are taking. Schedule a foot exam with your health care provider once a year. During the exam, the structure and skin of your feet will be checked for problems. Make sure that your health care provider does a visual foot exam at every visit. If you get a skin injury, such as a cut, blister, or sore, check the area every day for signs of infection. Check for: Redness,  swelling, or pain. Fluid or blood. Warmth. Pus or a bad smell. Do not use any products that contain nicotine or tobacco. These products include cigarettes, chewing tobacco, and vaping devices, such as e-cigarettes. If you need help quitting, ask your health care provider. Where to find more information American Diabetes Association: diabetes.org Association of Diabetes Care & Education Specialists: diabeteseducator.org Contact a health care provider if: You get a cut or sore, especially on your feet. You have signs of infection after a skin injury. You have itchy skin that turns red or develops a rash. You have discolored areas of skin. You have places on your skin that change. They may thicken or appear shiny. This information is not intended to replace advice given to you by your health care provider. Make sure you discuss any questions you have with your health care provider. Document Revised: 09/03/2021 Document Reviewed: 09/03/2021 Elsevier Patient Education  2024 Elsevier Inc.  

## 2023-01-11 DIAGNOSIS — N2581 Secondary hyperparathyroidism of renal origin: Secondary | ICD-10-CM | POA: Diagnosis not present

## 2023-01-11 DIAGNOSIS — Z992 Dependence on renal dialysis: Secondary | ICD-10-CM | POA: Diagnosis not present

## 2023-01-11 DIAGNOSIS — N186 End stage renal disease: Secondary | ICD-10-CM | POA: Diagnosis not present

## 2023-01-12 ENCOUNTER — Ambulatory Visit: Payer: Medicare HMO | Admitting: Nurse Practitioner

## 2023-01-12 ENCOUNTER — Encounter: Payer: Self-pay | Admitting: Nurse Practitioner

## 2023-01-12 ENCOUNTER — Encounter: Payer: Medicare HMO | Admitting: Physical Therapy

## 2023-01-12 ENCOUNTER — Ambulatory Visit: Payer: Self-pay | Admitting: *Deleted

## 2023-01-12 VITALS — BP 99/61 | HR 103 | Temp 98.5°F

## 2023-01-12 DIAGNOSIS — N185 Chronic kidney disease, stage 5: Secondary | ICD-10-CM

## 2023-01-12 DIAGNOSIS — I482 Chronic atrial fibrillation, unspecified: Secondary | ICD-10-CM | POA: Diagnosis not present

## 2023-01-12 DIAGNOSIS — E1169 Type 2 diabetes mellitus with other specified complication: Secondary | ICD-10-CM

## 2023-01-12 DIAGNOSIS — I5020 Unspecified systolic (congestive) heart failure: Secondary | ICD-10-CM

## 2023-01-12 DIAGNOSIS — N2581 Secondary hyperparathyroidism of renal origin: Secondary | ICD-10-CM

## 2023-01-12 DIAGNOSIS — G894 Chronic pain syndrome: Secondary | ICD-10-CM

## 2023-01-12 DIAGNOSIS — E1151 Type 2 diabetes mellitus with diabetic peripheral angiopathy without gangrene: Secondary | ICD-10-CM | POA: Diagnosis not present

## 2023-01-12 DIAGNOSIS — G8929 Other chronic pain: Secondary | ICD-10-CM | POA: Diagnosis not present

## 2023-01-12 DIAGNOSIS — L97921 Non-pressure chronic ulcer of unspecified part of left lower leg limited to breakdown of skin: Secondary | ICD-10-CM

## 2023-01-12 DIAGNOSIS — E785 Hyperlipidemia, unspecified: Secondary | ICD-10-CM | POA: Diagnosis not present

## 2023-01-12 DIAGNOSIS — Z89611 Acquired absence of right leg above knee: Secondary | ICD-10-CM

## 2023-01-12 DIAGNOSIS — E669 Obesity, unspecified: Secondary | ICD-10-CM

## 2023-01-12 DIAGNOSIS — I132 Hypertensive heart and chronic kidney disease with heart failure and with stage 5 chronic kidney disease, or end stage renal disease: Secondary | ICD-10-CM

## 2023-01-12 DIAGNOSIS — N186 End stage renal disease: Secondary | ICD-10-CM | POA: Diagnosis not present

## 2023-01-12 DIAGNOSIS — M79605 Pain in left leg: Secondary | ICD-10-CM

## 2023-01-12 DIAGNOSIS — D6869 Other thrombophilia: Secondary | ICD-10-CM | POA: Diagnosis not present

## 2023-01-12 DIAGNOSIS — E1122 Type 2 diabetes mellitus with diabetic chronic kidney disease: Secondary | ICD-10-CM

## 2023-01-12 DIAGNOSIS — L03116 Cellulitis of left lower limb: Secondary | ICD-10-CM | POA: Diagnosis not present

## 2023-01-12 DIAGNOSIS — Z992 Dependence on renal dialysis: Secondary | ICD-10-CM | POA: Diagnosis not present

## 2023-01-12 DIAGNOSIS — I120 Hypertensive chronic kidney disease with stage 5 chronic kidney disease or end stage renal disease: Secondary | ICD-10-CM | POA: Diagnosis not present

## 2023-01-12 DIAGNOSIS — Z794 Long term (current) use of insulin: Secondary | ICD-10-CM

## 2023-01-12 DIAGNOSIS — L97821 Non-pressure chronic ulcer of other part of left lower leg limited to breakdown of skin: Secondary | ICD-10-CM | POA: Diagnosis not present

## 2023-01-12 LAB — BAYER DCA HB A1C WAIVED: HB A1C (BAYER DCA - WAIVED): 5.9 % — ABNORMAL HIGH (ref 4.8–5.6)

## 2023-01-12 MED ORDER — TRAZODONE HCL 100 MG PO TABS: 100.0000 mg | ORAL_TABLET | Freq: Every day | ORAL | 4 refills | Status: DC

## 2023-01-12 NOTE — Assessment & Plan Note (Signed)
Chronic, stable, euvolemic today.  Continue current medication regimen and adjust as needed.  Continue collaboration with cardiology, is scheduled to see in December. Last visit August 2021 and missed recent visit.  Discussed at length. - Reminded to call for an overnight weight gain of >2 pounds or a weekly weight weight of >5 pounds - not adding salt to his food and has been reading food labels. Reviewed the importance of keeping daily sodium intake to 2000mg  daily  - Avoid NSAIDS

## 2023-01-12 NOTE — Assessment & Plan Note (Signed)
Ongoing, started just prior to wound.  Concern for circulation, diminished pulses noted.  Will continue his Norco 10-325 MG every 6 hours, but he is scheduled to see pain clinic tomorrow. Discussed with niece and him the importance of attending visit with them and not missing, as Norco not strong enough for pain at this time.  Discussed that chronic pain management is not performed at office.  Scheduled to see vascular in November.

## 2023-01-12 NOTE — Assessment & Plan Note (Signed)
Chronic, ongoing, followed by nephrology and dialysis teams.  Unsure about diet compliance as tends to eat fast food on occasion.  Continue current medication regimen as prescribed, refills sent.  Attempt to obtain recent notes from [facility].

## 2023-01-12 NOTE — Assessment & Plan Note (Signed)
Chronic, ongoing.  Continue current medication regimen and adjust as needed. Lipid panel today. 

## 2023-01-12 NOTE — Patient Instructions (Signed)
Visit Information  Thank you for taking time to visit with me today. Please don't hesitate to contact me if I can be of assistance to you.   Following are the goals we discussed today:   Goals Addressed             This Visit's Progress    referral to the [facility]  as well as short term skilled care       Activities and task to complete in order to accomplish goals.   REFERRAL PLACED BY SOCIAL WORK  I have provided the required forms for the [facility] CAP to your provider's office  to  complete to establish care.  Once reviewed, the Department of Health and Human Services will contact you to schedule a in hme assessment.          Our next appointment is by telephone on 01/26/23 at 2pm  Please call the care guide team at [phone] if you need to cancel or reschedule your appointment.   If you are experiencing a Mental Health or Behavioral Health Crisis or need someone to talk to, please call [phone]   Patient verbalizes understanding of instructions and care plan provided today and agrees to view in MyChart. Active MyChart status and patient understanding of how to access instructions and care plan via MyChart confirmed with patient.     Telephone follow up appointment with care management team member scheduled for: 01/26/23  Verna Czech, LCSW   [facility] Licensed Clinical Social Worker Care Coordinator  Direct Dial: [phone]

## 2023-01-12 NOTE — Progress Notes (Signed)
BP 99/61   Pulse (!) 103   Temp 98.5 F (36.9 C) (Oral)   SpO2 97%    Subjective:    [name], male    DOB: [birth year], 62 y.o.   MRN: [id number]  HPI: Adam Howell is a 62 y.o. male  Chief Complaint  Patient presents with   Diabetes   Hyperlipidemia   Hypertension   Gastroesophageal Reflux   Pain   His niece Angelique Blonder is with him today: Needs shower handle, shower seat with back, Rollator, walker, elevated toilet seat, and urinal male -- call Angelique Blonder, niece, [phone]   DIABETES A1c July 5.9%.  Range since August 2020 -- 4.7 to 6.8%.  Currently not taking any diabetes medicine.  Took Trulicity and insulin in past.    Had above knee amputation to right leg on 03/13/22, overall healing well and has prosthetic, but has not learned how to use. Saw vascular last on 07/21/2022 who is assisting with this and is scheduled to return 01/18/23 due to new wound on lower left leg.  Wound care is scheduled on 01/21/23 for left leg pressure wound which initially presented with 12/10/22.  Currently wound care nurse is coming twice a week. They just dressed this morning.  Is attending pain clinic tomorrow as is still having pain and Norco is not helping anymore.  Would like PT for mobility.   Hypoglycemic episodes:yes Polydipsia/polyuria: no Visual disturbance: no Chest pain: no Paresthesias: no Glucose Monitoring: yes             Accucheck frequency: daily             Fasting glucose: <130 on average             Post prandial:             Evening:             Before meals: Taking Insulin?: yes             Long acting insulin:              Short acting insulin:  Blood Pressure Monitoring: not checking Retinal Examination: Not up to Date Foot Exam: Up To Date Pneumovax: Up to Date  Influenza: Up to Date Aspirin: no    HYPERTENSION / HYPERLIPIDEMIA/HF Taking Eliquis, Amiodarone, Renvela, and Bumex. Takes Crestor daily for HLD.     Saw Dr. Okey Dupre with cardiology  on 11/10/19 -- has not returned since this time, missed follow-up -- scheduled to see cardiology in December.  Recent EF on 01/10/20 was 35-40%.   Satisfied with current treatment? yes Duration of hypertension: chronic BP monitoring frequency: not checking BP range:  BP medication side effects: no Duration of hyperlipidemia: chronic Cholesterol medication side effects: no Cholesterol supplements: none Medication compliance: good compliance Aspirin: no Recent stressors: no Recurrent headaches: no Visual changes: no Palpitations: no Dyspnea: no Chest pain: no Lower extremity edema: no Dizzy/lightheaded: no    CHRONIC KIDNEY DISEASE End stage renal disease and gets dialysis.  Goes to dialysis Monday, Wednesday, Friday.  Sees nephrology in clinic with dialysis. CKD status: stable Medications renally dose: yes Previous renal evaluation: yes Pneumovax:  Up to Date Influenza Vaccine:  Up to Date    INSOMNIA Currently taking Trazodone 50 MG at bedtime.  Not offering much benefit at this time.  Took Ambien in past. Duration: chronic Satisfied with sleep quality: yes Difficulty falling asleep: yes Difficulty staying asleep: no Waking a few hours after  sleep onset: no Early morning awakenings: no Daytime hypersomnolence: no Wakes feeling refreshed: yes Good sleep hygiene: yes Apnea: no Snoring: no Depressed/anxious mood: no Recent stress: no Restless legs/nocturnal leg cramps: no Chronic pain/arthritis: no History of sleep study: yes, no CPAP Treatments attempted: Ambien   Relevant past medical, surgical, family and social history reviewed and updated as indicated. Interim medical history since our last visit reviewed. Allergies and medications reviewed and updated.  Review of Systems  Constitutional:  Negative for activity change, diaphoresis, fatigue and fever.  Respiratory:  Negative for cough, chest tightness, shortness of breath and wheezing.   Cardiovascular:  Negative  for chest pain, palpitations and leg swelling.  Gastrointestinal: Negative.   Endocrine: Negative for polydipsia, polyphagia and polyuria.  Neurological: Negative.   Psychiatric/Behavioral: Negative.      Per HPI unless specifically indicated above     Objective:    BP 99/61   Pulse (!) 103   Temp 98.5 F (36.9 C) (Oral)   SpO2 97%   Wt Readings from Last 3 Encounters:  12/24/22 265 lb (120.2 kg)  08/27/22 250 lb (113.4 kg)  05/12/22 232 lb (105.2 kg)    Physical Exam Vitals and nursing note reviewed.  Constitutional:      General: He is awake. He is not in acute distress.    Appearance: He is well-developed and well-groomed. He is obese. He is not ill-appearing or toxic-appearing.  HENT:     Head: Normocephalic and atraumatic.     Right Ear: Hearing normal. No drainage.     Left Ear: Hearing normal. No drainage.  Eyes:     General: Lids are normal.        Right eye: No discharge.        Left eye: No discharge.     Conjunctiva/sclera: Conjunctivae normal.     Pupils: Pupils are equal, round, and reactive to light.  Neck:     Thyroid: No thyromegaly.     Vascular: No carotid bruit.     Trachea: Trachea normal.  Cardiovascular:     Rate and Rhythm: Normal rate and regular rhythm.     Pulses:          Dorsalis pedis pulses are 1+ on the left side.       Posterior tibial pulses are 1+ on the left side.     Heart sounds: Normal heart sounds, S1 normal and S2 normal. No murmur heard.    No gallop.     Arteriovenous access: Left arteriovenous access is present.    Comments: Decreased hair pattern bilateral legs. Pulmonary:     Effort: Pulmonary effort is normal. No accessory muscle usage or respiratory distress.     Breath sounds: Normal breath sounds.  Abdominal:     General: Bowel sounds are normal. There is no distension.     Palpations: Abdomen is soft.     Tenderness: There is no abdominal tenderness.  Musculoskeletal:        General: Normal range of motion.      Cervical back: Normal range of motion and neck supple.     Left lower leg: No edema.     Right Lower Extremity: Right leg is amputated above knee.  Skin:    General: Skin is warm and dry.     Capillary Refill: Capillary refill takes less than 2 seconds.     Findings: No rash.     Comments: Wound care done this morning by nurse and area wrapped.  Did not assess wound today in office.  Neurological:     Mental Status: [name] and oriented to person, place, and time.     Deep Tendon Reflexes: Reflexes are normal and symmetric.  Psychiatric:        Attention and Perception: Attention normal.        Mood and Affect: Mood normal.        Speech: Speech normal.        Behavior: Behavior normal. Behavior is cooperative.        Thought Content: Thought content normal.        Judgment: Judgment normal.     Results for orders placed or performed in visit on 01/12/23  Bayer DCA Hb A1c Waived  Result Value Ref Range   HB A1C (BAYER DCA - WAIVED) 5.9 (H) 4.8 - 5.6 %      Assessment & Plan:   Problem List Items Addressed This Visit       Cardiovascular and Mediastinum   Chronic atrial fibrillation with RVR (HCC)    Chronic, ongoing.  Rate controlled.  Continue current medication regimen.  Continue collaboration with cardiology, is scheduled in December with them. Last visit August 2021 and missed recent visit.  Discussed at length.       Heart failure with reduced ejection fraction (HCC)    Chronic, stable, euvolemic today.  Continue current medication regimen and adjust as needed.  Continue collaboration with cardiology, is scheduled to see in December. Last visit August 2021 and missed recent visit.  Discussed at length. - Reminded to call for an overnight weight gain of >2 pounds or a weekly weight weight of >5 pounds - not adding salt to his food and has been reading food labels. Reviewed the importance of keeping daily sodium intake to 2000mg  daily  - Avoid NSAIDS       Hypertensive heart and kidney disease with heart failure and chronic kidney disease stage V (HCC)    Chronic, ongoing, followed by dialysis team.  BP well below goal for ESRD in office today, monitor closely.  Continue current medication regimen as prescribed by nephrology.  Attempt to obtain recent notes from [facility].  Labs today: A1c and CBC today.  Recommend he monitor BP at home a few days a week and document + focus on DASH diet.  Recent nephrology notes reviewed.          Endocrine   Hyperlipidemia associated with type 2 diabetes mellitus (HCC)    Chronic, ongoing.  Continue current medication regimen and adjust as needed.  Lipid panel today.         Relevant Orders   Lipid Panel w/o Chol/HDL Ratio   Bayer DCA Hb A1c Waived (Completed)   Secondary hyperparathyroidism of renal origin (HCC)    Chronic, ongoing with ESRD.  Continue collaboration with dialysis team and nephrology, attempt to obtain recent notes from [facility].      Type 2 diabetes mellitus with ESRD (end-stage renal disease) (HCC) - Primary    Chronic, ongoing with A1c 5.9% today, remaining stable.  Unable to obtain urine for ALB check.  At goal.  Continue off medications at this time.  Recommend he monitor BS at least a couple times a day and document for provider + focus on diabetic diet.  Return to office in 3 months for follow-up.   - Needs to schedule eye exam, recommend he do so. - Foot exam today - Vaccination, refuses some. - Statin on board.  ESRD, no ACE or ARB at this time.      Relevant Orders   Bayer DCA Hb A1c Waived (Completed)     Musculoskeletal and Integument   Ulcer of left lower extremity, limited to breakdown of skin (HCC)    Ongoing since prior to 12/10/22.  Concern as history of ulcerations to right leg leading to AKA. Scheduled to see vascular and wound care upcoming, appreciate their input.  Currently has wound care in home and he reports area is getting smaller.        Genitourinary    ESRD on dialysis [facility])    Chronic, ongoing, followed by nephrology and dialysis teams.  Unsure about diet compliance as tends to eat fast food on occasion.  Continue current medication regimen as prescribed, refills sent.  Attempt to obtain recent notes from [facility].          Hematopoietic and Hemostatic   Other thrombophilia (HCC)    With atrial fibrillation and on Eliquis.  Continue to collaborate with cardiology and continue current medication regimen.      Relevant Orders   CBC with Differential/Platelet     Other   Left leg pain    Ongoing, started just prior to wound.  Concern for circulation, diminished pulses noted.  Will continue his Norco 10-325 MG every 6 hours, but he is scheduled to see pain clinic tomorrow. Discussed with niece and him the importance of attending visit with them and not missing, as Norco not strong enough for pain at this time.  Discussed that chronic pain management is not performed at office.  Scheduled to see vascular in November.      Obesity (BMI 30-39.9)   S/P AKA (above knee amputation), right (HCC)    Performed 03/13/22.  Continue collaboration with vascular and monitor skin status closely.  Order needs for home and send to home supply.      Relevant Orders   Ambulatory referral to Physical Therapy   For home use only DME Other see comment   For home use only DME 4 wheeled rolling walker with seat ([id number])   For home use only DME Walker     Follow up plan: Return in about 20 days (around 02/01/2023) for WOUND CHECK.

## 2023-01-12 NOTE — Assessment & Plan Note (Signed)
Chronic, ongoing with A1c 5.9% today, remaining stable.  Unable to obtain urine for ALB check.  At goal.  Continue off medications at this time.  Recommend he monitor BS at least a couple times a day and document for provider + focus on diabetic diet.  Return to office in 3 months for follow-up.   - Needs to schedule eye exam, recommend he do so. - Foot exam today - Vaccination, refuses some. - Statin on board.  ESRD, no ACE or ARB at this time.

## 2023-01-12 NOTE — Assessment & Plan Note (Signed)
Chronic, ongoing with ESRD.  Continue collaboration with dialysis team and nephrology, attempt to obtain recent notes from [facility]. 

## 2023-01-12 NOTE — Assessment & Plan Note (Signed)
Performed 03/13/22.  Continue collaboration with vascular and monitor skin status closely.  Order needs for home and send to home supply.

## 2023-01-12 NOTE — Assessment & Plan Note (Signed)
Ongoing since prior to 12/10/22.  Concern as history of ulcerations to right leg leading to AKA. Scheduled to see vascular and wound care upcoming, appreciate their input.  Currently has wound care in home and he reports area is getting smaller.

## 2023-01-12 NOTE — Assessment & Plan Note (Signed)
Chronic, ongoing.  Rate controlled.  Continue current medication regimen.  Continue collaboration with cardiology, is scheduled in December with them. Last visit August 2021 and missed recent visit.  Discussed at length.

## 2023-01-12 NOTE — Assessment & Plan Note (Addendum)
Chronic, ongoing, followed by dialysis team.  BP well below goal for ESRD in office today, monitor closely.  Continue current medication regimen as prescribed by nephrology.  Attempt to obtain recent notes from [facility].  Labs today: A1c and CBC today.  Recommend he monitor BP at home a few days a week and document + focus on DASH diet.  Recent nephrology notes reviewed.

## 2023-01-12 NOTE — Patient Outreach (Addendum)
Care Coordination   Follow Up Visit Note   01/12/2023 Name: Darby Kaup MRN: [id number] DOB: 08-20-1960  Norton Clemmens is a 62 y.o. year old male who sees Aura Dials T, NP for primary care. I engaged with Isac Sarna  and his niece in the providers office today.  What matters to the patients health and wellness today?  Additional options for in home care-referral for CAP services    Goals Addressed             This Visit's Progress    referral to the [facility]  as well as short term skilled care       Activities and task to complete in order to accomplish goals.   REFERRAL PLACED BY SOCIAL WORK  I have provided the required forms for the [facility] CAP to your provider's office  to  complete to establish care.  Once reviewed, the Department of Health and Human Services will contact you to schedule a in hme assessment.          SDOH assessments and interventions completed:  No     Care Coordination Interventions:  Yes, provided  Interventions Today    Flowsheet Row Most Recent Value  Chronic Disease   Chronic disease during today's visit Hypertension (HTN)  General Interventions   General Interventions Discussed/Reviewed General Interventions Reviewed, Level of Care, Communication with  Doctor Visits Discussed/Reviewed Doctor Visits Reviewed  [facility] 01/12/23]  Communication with PCP/Specialists  [discussed plan to refer pt for CAP services-practice provided with the needed paper work to complete and return to the Dept of Health and Human Services]  Level of Care Personal Care Services  Education Interventions   Education Provided Provided Education  Provided Verbal Education On Community Resources  [[facility] Program(CAP) eligibiltiy process discussed-MD to complete paper work and return to the Dept of Health and Health and safety inspector for review-pt signed consent]       Follow up plan: Follow up  call scheduled for 01/26/23    Encounter Outcome:  Patient Visit Completed

## 2023-01-12 NOTE — Assessment & Plan Note (Signed)
With atrial fibrillation and on Eliquis.  Continue to collaborate with cardiology and continue current medication regimen.

## 2023-01-13 ENCOUNTER — Ambulatory Visit: Payer: Self-pay

## 2023-01-13 DIAGNOSIS — N186 End stage renal disease: Secondary | ICD-10-CM | POA: Diagnosis not present

## 2023-01-13 DIAGNOSIS — Z992 Dependence on renal dialysis: Secondary | ICD-10-CM | POA: Diagnosis not present

## 2023-01-13 DIAGNOSIS — N2581 Secondary hyperparathyroidism of renal origin: Secondary | ICD-10-CM | POA: Diagnosis not present

## 2023-01-13 LAB — CBC WITH DIFFERENTIAL/PLATELET
Basophils Absolute: 0.1 x10E3/uL (ref 0.0–0.2)
Basos: 1 %
EOS (ABSOLUTE): 0.2 x10E3/uL (ref 0.0–0.4)
Eos: 3 %
Hematocrit: 51 % (ref 37.5–51.0)
Hemoglobin: 16.8 g/dL (ref 13.0–17.7)
Immature Grans (Abs): 0 x10E3/uL (ref 0.0–0.1)
Immature Granulocytes: 0 %
Lymphocytes Absolute: 1 x10E3/uL (ref 0.7–3.1)
Lymphs: 19 %
MCH: 28.8 pg (ref 26.6–33.0)
MCHC: 32.9 g/dL (ref 31.5–35.7)
MCV: 88 fL (ref 79–97)
Monocytes Absolute: 0.5 x10E3/uL (ref 0.1–0.9)
Monocytes: 10 %
Neutrophils Absolute: 3.7 x10E3/uL (ref 1.4–7.0)
Neutrophils: 67 %
Platelets: 257 x10E3/uL (ref 150–450)
RBC: 5.83 x10E6/uL — ABNORMAL HIGH (ref 4.14–5.80)
RDW: 14.8 % (ref 11.6–15.4)
WBC: 5.5 x10E3/uL (ref 3.4–10.8)

## 2023-01-13 LAB — LIPID PANEL W/O CHOL/HDL RATIO
Cholesterol, Total: 179 mg/dL (ref 100–199)
HDL: 43 mg/dL (ref >39)
LDL Chol Calc (NIH): 117 mg/dL — ABNORMAL HIGH (ref 0–99)
Triglycerides: 106 mg/dL (ref 0–149)
VLDL Cholesterol Cal: 19 mg/dL (ref 5–40)

## 2023-01-13 NOTE — Telephone Encounter (Signed)
  Chief Complaint: leg pain Symptoms: LLE pain, 10/10, bandaged up d/t ulcer Frequency: ongoing a while Pertinent Negatives: NA Disposition: [] ED /[] Urgent Care (no appt availability in office) / [] Appointment(In office/virtual)/ []  [facility]/ [] Home Care/ [] Refused Recommended Disposition /[]  Mobile Bus/ [x]  Follow-up with PCP Additional Notes: Pt is asking for refill on Hydrocodone. Had appt today with pain clinic but was given wrong address so appt is rescheduled for 01/26/23. Pt is out of Hydrocodone and has 10/10 pain. Advised I would send message back to PCP since pt had OV yesterday.   Reason for Disposition  [1] SEVERE pain (e.g., excruciating, unable to do any normal activities) AND [2] not improved after 2 hours of pain medicine  Answer Assessment - Initial Assessment Questions 1. ONSET: "When did the pain start?"      Ongoing a while  2. LOCATION: "Where is the pain located?"      L lower leg  3. PAIN: "How bad is the pain?"    (Scale 1-10; or mild, moderate, severe)   -  MILD (1-3): doesn't interfere with normal activities    -  MODERATE (4-7): interferes with normal activities (e.g., work or school) or awakens from sleep, limping    -  SEVERE (8-10): excruciating pain, unable to do any normal activities, unable to walk     10/10 5. CAUSE: "What do you think is causing the leg pain?"     Ulcer  6. OTHER SYMPTOMS: "Do you have any other symptoms?" (e.g., chest pain, back pain, breathing difficulty, swelling, rash, fever, numbness, weakness)     Bandage on LLE  Protocols used: Leg Pain-A-AH

## 2023-01-13 NOTE — Progress Notes (Signed)
Please let patient know his labs have returned: - Blood counts are normal with no anemia or infection noted - Lipid panel shows trend up in LDL, I do recommend to ensure you are taking your Rosuvastatin daily.  Any questions? We will recheck LDL next visit.   We ordered all supplies requested and will fax, if you do not hear anything from the medical supply over next week let us know.   Keep being stellar!!  Thank you for allowing me to participate in your care.  I appreciate you. Kindest regards, Lassie Demorest

## 2023-01-14 ENCOUNTER — Encounter: Payer: Medicare HMO | Admitting: Physical Therapy

## 2023-01-14 ENCOUNTER — Telehealth: Payer: Self-pay | Admitting: Nurse Practitioner

## 2023-01-14 DIAGNOSIS — I120 Hypertensive chronic kidney disease with stage 5 chronic kidney disease or end stage renal disease: Secondary | ICD-10-CM | POA: Diagnosis not present

## 2023-01-14 DIAGNOSIS — Z992 Dependence on renal dialysis: Secondary | ICD-10-CM | POA: Diagnosis not present

## 2023-01-14 DIAGNOSIS — N186 End stage renal disease: Secondary | ICD-10-CM | POA: Diagnosis not present

## 2023-01-14 MED ORDER — HYDROCODONE-ACETAMINOPHEN 10-325 MG PO TABS: 1.0000 | ORAL_TABLET | Freq: Three times a day (TID) | ORAL | 0 refills | Status: AC | PRN

## 2023-01-14 NOTE — Telephone Encounter (Signed)
Patient is calling back to see if provider will call in a pain medication for his leg. Please f/u with patient

## 2023-01-14 NOTE — Telephone Encounter (Signed)
See other phone encounter.  

## 2023-01-14 NOTE — Telephone Encounter (Signed)
Called and notified patient of providers message. Patient verbalized understanding.  

## 2023-01-14 NOTE — Addendum Note (Signed)
Addended by: Aura Dials T on: 01/14/2023 11:35 AM   Modules accepted: Orders

## 2023-01-15 DIAGNOSIS — E1122 Type 2 diabetes mellitus with diabetic chronic kidney disease: Secondary | ICD-10-CM | POA: Diagnosis not present

## 2023-01-15 DIAGNOSIS — L03116 Cellulitis of left lower limb: Secondary | ICD-10-CM | POA: Diagnosis not present

## 2023-01-15 DIAGNOSIS — N186 End stage renal disease: Secondary | ICD-10-CM | POA: Diagnosis not present

## 2023-01-15 DIAGNOSIS — Z89611 Acquired absence of right leg above knee: Secondary | ICD-10-CM | POA: Diagnosis not present

## 2023-01-15 DIAGNOSIS — G8929 Other chronic pain: Secondary | ICD-10-CM | POA: Diagnosis not present

## 2023-01-15 DIAGNOSIS — L97821 Non-pressure chronic ulcer of other part of left lower leg limited to breakdown of skin: Secondary | ICD-10-CM | POA: Diagnosis not present

## 2023-01-15 DIAGNOSIS — E1151 Type 2 diabetes mellitus with diabetic peripheral angiopathy without gangrene: Secondary | ICD-10-CM | POA: Diagnosis not present

## 2023-01-15 DIAGNOSIS — I120 Hypertensive chronic kidney disease with stage 5 chronic kidney disease or end stage renal disease: Secondary | ICD-10-CM | POA: Diagnosis not present

## 2023-01-15 DIAGNOSIS — N2581 Secondary hyperparathyroidism of renal origin: Secondary | ICD-10-CM | POA: Diagnosis not present

## 2023-01-15 DIAGNOSIS — Z992 Dependence on renal dialysis: Secondary | ICD-10-CM | POA: Diagnosis not present

## 2023-01-16 DIAGNOSIS — L97821 Non-pressure chronic ulcer of other part of left lower leg limited to breakdown of skin: Secondary | ICD-10-CM | POA: Diagnosis not present

## 2023-01-16 DIAGNOSIS — E1151 Type 2 diabetes mellitus with diabetic peripheral angiopathy without gangrene: Secondary | ICD-10-CM | POA: Diagnosis not present

## 2023-01-16 DIAGNOSIS — G8929 Other chronic pain: Secondary | ICD-10-CM | POA: Diagnosis not present

## 2023-01-16 DIAGNOSIS — L03116 Cellulitis of left lower limb: Secondary | ICD-10-CM | POA: Diagnosis not present

## 2023-01-16 DIAGNOSIS — E1122 Type 2 diabetes mellitus with diabetic chronic kidney disease: Secondary | ICD-10-CM | POA: Diagnosis not present

## 2023-01-16 DIAGNOSIS — Z89611 Acquired absence of right leg above knee: Secondary | ICD-10-CM | POA: Diagnosis not present

## 2023-01-16 DIAGNOSIS — N186 End stage renal disease: Secondary | ICD-10-CM | POA: Diagnosis not present

## 2023-01-16 DIAGNOSIS — I120 Hypertensive chronic kidney disease with stage 5 chronic kidney disease or end stage renal disease: Secondary | ICD-10-CM | POA: Diagnosis not present

## 2023-01-16 DIAGNOSIS — Z992 Dependence on renal dialysis: Secondary | ICD-10-CM | POA: Diagnosis not present

## 2023-01-18 ENCOUNTER — Encounter (INDEPENDENT_AMBULATORY_CARE_PROVIDER_SITE_OTHER): Payer: Self-pay | Admitting: Nurse Practitioner

## 2023-01-18 ENCOUNTER — Ambulatory Visit (INDEPENDENT_AMBULATORY_CARE_PROVIDER_SITE_OTHER): Payer: Medicare HMO

## 2023-01-18 ENCOUNTER — Telehealth: Payer: Self-pay | Admitting: Nurse Practitioner

## 2023-01-18 ENCOUNTER — Ambulatory Visit (INDEPENDENT_AMBULATORY_CARE_PROVIDER_SITE_OTHER): Payer: Medicare HMO | Admitting: Nurse Practitioner

## 2023-01-18 VITALS — BP 119/79 | HR 78 | Resp 18 | Ht 75.0 in | Wt 265.0 lb

## 2023-01-18 DIAGNOSIS — N186 End stage renal disease: Secondary | ICD-10-CM

## 2023-01-18 DIAGNOSIS — E785 Hyperlipidemia, unspecified: Secondary | ICD-10-CM | POA: Diagnosis not present

## 2023-01-18 DIAGNOSIS — E1169 Type 2 diabetes mellitus with other specified complication: Secondary | ICD-10-CM

## 2023-01-18 DIAGNOSIS — E1122 Type 2 diabetes mellitus with diabetic chronic kidney disease: Secondary | ICD-10-CM | POA: Diagnosis not present

## 2023-01-18 DIAGNOSIS — L97911 Non-pressure chronic ulcer of unspecified part of right lower leg limited to breakdown of skin: Secondary | ICD-10-CM | POA: Diagnosis not present

## 2023-01-18 NOTE — Telephone Encounter (Signed)
Called and provided verbal orders per Jolene.  ?

## 2023-01-18 NOTE — Telephone Encounter (Signed)
Home Health Verbal Orders - Caller/Agency: [facility]  Callback Number: [phone] Requesting OT/PT/Skilled Nursing/Social Work/Speech Therapy: PT  Frequency:   1w7 Gait training with prosthetic leg, safety, fall prevention strategy

## 2023-01-18 NOTE — Progress Notes (Unsigned)
Subjective:    [name], male    DOB: 1961-03-06, 62 y.o.   MRN: [id number] Chief Complaint  Patient presents with  . Follow-up    Ulcer of left lower extremity, limited to breakdown of skin ABI. f/u hda **pt had USs on 10/2 & 10/15**    HPI  Review of Systems     Objective:   Physical Exam  BP 119/79 (BP Location: Right Arm)   Pulse 78   Resp 18   Ht 6\' 3"  (1.905 m)   Wt 265 lb (120.2 kg)   BMI 33.12 kg/m   Past Medical History:  Diagnosis Date  . Chronic kidney disease   . Congestive heart failure (HCC)   . Diabetes mellitus without complication (HCC)   . Difficult intubation   . Hyperlipidemia   . Hypertension     Social History   Socioeconomic History  . Marital status: Divorced    Spouse name: Javien Tesch  . Number of children: 4  . Years of education: Not on file  . Highest education level: Not on file  Occupational History  . Not on file  Tobacco Use  . Smoking status: Never  . Smokeless tobacco: Never  Vaping Use  . Vaping status: Never Used  Substance and Sexual Activity  . Alcohol use: Not Currently  . Drug use: Never  . Sexual activity: Not Currently  Other Topics Concern  . Not on file  Social History Narrative   Lives with daughter [name].     Social Determinants of Health   Financial Resource Strain: Low Risk  (11/25/2021)   Overall Financial Resource Strain (CARDIA)   . Difficulty of Paying Living Expenses: Not hard at all  Food Insecurity: No Food Insecurity (12/29/2022)   Hunger Vital Sign   . Worried About Programme researcher, broadcasting/film/video in the Last Year: Never true   . Ran Out of Food in the Last Year: Never true  Transportation Needs: Unmet Transportation Needs (12/29/2022)   PRAPARE - Transportation   . Lack of Transportation (Medical): Yes   . Lack of Transportation (Non-Medical): Yes  Physical Activity: Insufficiently Active (11/25/2021)   Exercise Vital Sign   . Days of Exercise per Week: 2 days   .  Minutes of Exercise per Session: 60 min  Stress: No Stress Concern Present (11/25/2021)   [employer] of Occupational Health - Occupational Stress Questionnaire   . Feeling of Stress : Not at all  Social Connections: Moderately Isolated (12/29/2022)   Social Connection and Isolation Panel [NHANES]   . Frequency of Communication with Friends and Family: More than three times a week   . Frequency of Social Gatherings with Friends and Family: Three times a week   . Attends Religious Services: 1 to 4 times per year   . Active Member of Clubs or Organizations: No   . Attends Banker Meetings: Never   . Marital Status: Never married  Intimate Partner Violence: Not At Risk (03/06/2022)   Humiliation, Afraid, Rape, and Kick questionnaire   . Fear of Current or Ex-Partner: No   . Emotionally Abused: No   . Physically Abused: No   . Sexually Abused: No    Past Surgical History:  Procedure Laterality Date  . A/V FISTULAGRAM Left 08/27/2022   Procedure: A/V Fistulagram;  Surgeon: Annice Needy, MD;  Location: [facility];  Service: Cardiovascular;  Laterality: Left;  . AMPUTATION Right 03/13/2022   Procedure: AMPUTATION ABOVE  KNEE;  Surgeon: Annice Needy, MD;  Location: [facility] OR[facility];  Service: General;  Laterality: Right;  . APPLICATION OF WOUND VAC Right 03/07/2022   Procedure: APPLICATION OF WOUND VAC;  Surgeon: Bertram Denver, MD;  Location: [facility] OR[facility];  Service: Vascular;  Laterality: Right;  GAAC [address]  . WOUND DEBRIDEMENT Left   . WOUND DEBRIDEMENT Right 01/16/2022   Procedure: DEBRIDEMENT WOUND;  Surgeon: Renford Dills, MD;  Location: [facility] ORS;  Service: Vascular;  Laterality: Right;  Wound vac placement  . WOUND DEBRIDEMENT Right 03/07/2022   Procedure: DEBRIDEMENT WOUND RIGHT LOWER EXTREMITY;  Surgeon: Bertram Denver, MD;  Location: [facility] OR[facility];  Service: Vascular;  Laterality: Right;    Family History  Problem Relation Age of Onset  . Heart failure Mother   .  Cirrhosis Father   . Hypertension Daughter     Allergies  Allergen Reactions  . Tramadol Rash       Latest Ref Rng & Units 01/12/2023    2:14 PM 12/24/2022   11:07 AM 08/11/2022    1:36 PM  CBC  WBC 3.4 - 10.8 x10E3/uL 5.5  5.2  5.5   Hemoglobin 13.0 - 17.7 g/dL 60.4  54.0  98.1   Hematocrit 37.5 - 51.0 % 51.0  52.4  46.2   Platelets 150 - 450 x10E3/uL 257  176  131       CMP     Component Value Date/Time   NA 134 (L) 12/24/2022 1107   NA 135 08/11/2022 1336   K 5.0 12/24/2022 1107   CL 89 (L) 12/24/2022 1107   CO2 29 12/24/2022 1107   GLUCOSE 89 12/24/2022 1107   BUN 27 (H) 12/24/2022 1107   BUN 96 (HH) 08/11/2022 1336   CREATININE 7.91 (H) 12/24/2022 1107   CALCIUM 8.3 (L) 12/24/2022 1107   PROT 8.4 (H) 12/24/2022 1107   PROT 7.8 08/11/2022 1336   ALBUMIN 3.7 12/24/2022 1107   ALBUMIN 4.3 08/11/2022 1336   AST 15 12/24/2022 1107   ALT 11 12/24/2022 1107   ALKPHOS 49 12/24/2022 1107   BILITOT 0.8 12/24/2022 1107   BILITOT 0.8 08/11/2022 1336   EGFR 4 (L) 08/11/2022 1336   GFRNONAA 7 (L) 12/24/2022 1107     VAS [place] ABI WITH/WO TBI  Result Date: 12/31/2022  LOWER EXTREMITY DOPPLER STUDY Patient Name:  Adam Howell  Date of Exam:   12/17/2022 Medical Rec #: [id number]            Accession #:    [id number] Date of Birth: 1960/12/30            Patient Gender: M Patient Age:   62 years Exam Location:  [facility] Procedure:      VAS [place] ABI WITH/WO TBI Referring Phys: Levora Dredge --------------------------------------------------------------------------------  Indications: Ulceration, and peripheral artery disease.  Vascular Interventions: 03/13/2022: RT AKA. Performing Technologist: Debbe Bales RVS  Examination Guidelines: A complete evaluation includes at minimum, Doppler waveform signals and systolic blood pressure reading at the level of bilateral brachial, anterior tibial, and posterior tibial arteries, when vessel segments are accessible.  Bilateral testing is considered an integral part of a complete examination. Photoelectric Plethysmograph (PPG) waveforms and toe systolic pressure readings are included as required and additional duplex testing as needed. Limited examinations for reoccurring indications may be performed as noted.  ABI Findings: +--------+------------------+-----+--------+--------+ Right   Rt Pressure (mmHg)IndexWaveformComment  +--------+------------------+-----+--------+--------+ [id number]                                     +--------+------------------+-----+--------+--------+ +---------+------------------+-----+--------+-------+  Left     Lt Pressure (mmHg)IndexWaveformComment +---------+------------------+-----+--------+-------+ Brachial                                HDA     +---------+------------------+-----+--------+-------+ PTA      125               0.99 biphasic        +---------+------------------+-----+--------+-------+ PERO     109               0.87 biphasic        +---------+------------------+-----+--------+-------+ Great Toe84                0.67 Normal          +---------+------------------+-----+--------+-------+ +-------+-----------+-----------+------------+------------+ ABI/TBIToday's ABIToday's TBIPrevious ABIPrevious TBI +-------+-----------+-----------+------------+------------+ Right  Rt AKA                                         +-------+-----------+-----------+------------+------------+ Left   .99        .67                                 +-------+-----------+-----------+------------+------------+  Summary: Right: RT AKA. Left: Resting left ankle-brachial index is within normal range. The left toe-brachial index is normal. *See table(s) above for measurements and observations.  Electronically signed by Levora Dredge MD on 12/31/2022 at 5:52:21 PM.    Final        Assessment & Plan:   1. ESRD (end stage renal disease) (HCC) ***  2.  Skin ulcer of right lower leg, limited to breakdown of skin (HCC) ***  3. Hyperlipidemia associated with type 2 diabetes mellitus (HCC) Continue statin as ordered and reviewed, no changes at this time  4. Type 2 diabetes mellitus with ESRD (end-stage renal disease) (HCC) Continue hypoglycemic medications as already ordered, these medications have been reviewed and there are no changes at this time.  Hgb A1C to be monitored as already arranged by primary service   Current Outpatient Medications on File Prior to Visit  Medication Sig Dispense Refill  . amiodarone (PACERONE) 200 MG tablet Take 1 tablet (200 mg total) by mouth 2 (two) times daily. 180 tablet 4  . calcitRIOL (ROCALTROL) 0.5 MCG capsule Take 3 capsules (1.5 mcg total) by mouth every Monday, Wednesday, and Friday at 6 PM.    . cinacalcet (SENSIPAR) 30 MG tablet Take 1 tablet (30 mg total) by mouth daily with breakfast. 30 tablet 0  . Disposable Gloves (NITRILE EXAM GLOVES LARGE) MISC To use as needed for bathing patient. 100 each 12  . ELIQUIS 2.5 MG TABS tablet Take 1 tablet (2.5 mg total) by mouth 2 (two) times daily. 180 tablet 4  . hydrocerin (EUCERIN) CREA Apply 1 Application topically 2 (two) times daily. To foot 454 g 0  . HYDROcodone-acetaminophen (NORCO) 10-325 MG tablet Take 1 tablet by mouth every 8 (eight) hours as needed for up to 13 days. 39 tablet 0  . leptospermum manuka honey (MEDIHONEY) PSTE paste Apply 1 Application topically daily. To wound on lower left leg. 15 mL 3  . melatonin 5 MG TABS Take 2 tablets (10 mg total) by mouth at bedtime as needed. 60 tablet 0  . multivitamin (RENA-VIT) TABS tablet Take 1 tablet by mouth at  bedtime. 30 tablet 0  . pregabalin (LYRICA) 25 MG capsule Take 1 capsule (25 mg total) by mouth daily. 30 capsule 2  . rosuvastatin (CRESTOR) 40 MG tablet Take 1 tablet (40 mg total) by mouth daily. 90 tablet 4  . senna (SENOKOT) 8.6 MG TABS tablet Take 2 tablets (17.2 mg total) by mouth at  bedtime. 60 tablet 0  . sevelamer carbonate (RENVELA) 800 MG tablet Take 2,400 mg by mouth 3 (three) times daily.    . traZODone (DESYREL) 100 MG tablet Take 1 tablet (100 mg total) by mouth at bedtime. 90 tablet 4   No current facility-administered medications on file prior to visit.    There are no Patient Instructions on file for this visit. No follow-ups on file.   Georgiana Spinner, NP

## 2023-01-20 DIAGNOSIS — Z992 Dependence on renal dialysis: Secondary | ICD-10-CM | POA: Diagnosis not present

## 2023-01-20 DIAGNOSIS — N186 End stage renal disease: Secondary | ICD-10-CM | POA: Diagnosis not present

## 2023-01-20 DIAGNOSIS — N2581 Secondary hyperparathyroidism of renal origin: Secondary | ICD-10-CM | POA: Diagnosis not present

## 2023-01-21 ENCOUNTER — Ambulatory Visit: Payer: Medicare HMO | Admitting: Physician Assistant

## 2023-01-21 DIAGNOSIS — L03116 Cellulitis of left lower limb: Secondary | ICD-10-CM | POA: Diagnosis not present

## 2023-01-21 DIAGNOSIS — Z89611 Acquired absence of right leg above knee: Secondary | ICD-10-CM | POA: Diagnosis not present

## 2023-01-21 DIAGNOSIS — I120 Hypertensive chronic kidney disease with stage 5 chronic kidney disease or end stage renal disease: Secondary | ICD-10-CM | POA: Diagnosis not present

## 2023-01-21 DIAGNOSIS — E1122 Type 2 diabetes mellitus with diabetic chronic kidney disease: Secondary | ICD-10-CM | POA: Diagnosis not present

## 2023-01-21 DIAGNOSIS — N186 End stage renal disease: Secondary | ICD-10-CM | POA: Diagnosis not present

## 2023-01-21 DIAGNOSIS — E1151 Type 2 diabetes mellitus with diabetic peripheral angiopathy without gangrene: Secondary | ICD-10-CM | POA: Diagnosis not present

## 2023-01-21 DIAGNOSIS — L97821 Non-pressure chronic ulcer of other part of left lower leg limited to breakdown of skin: Secondary | ICD-10-CM | POA: Diagnosis not present

## 2023-01-21 DIAGNOSIS — G8929 Other chronic pain: Secondary | ICD-10-CM | POA: Diagnosis not present

## 2023-01-21 DIAGNOSIS — Z992 Dependence on renal dialysis: Secondary | ICD-10-CM | POA: Diagnosis not present

## 2023-01-22 ENCOUNTER — Encounter: Payer: Medicare HMO | Admitting: Physical Therapy

## 2023-01-22 DIAGNOSIS — Z992 Dependence on renal dialysis: Secondary | ICD-10-CM | POA: Diagnosis not present

## 2023-01-22 DIAGNOSIS — I120 Hypertensive chronic kidney disease with stage 5 chronic kidney disease or end stage renal disease: Secondary | ICD-10-CM | POA: Diagnosis not present

## 2023-01-22 DIAGNOSIS — N186 End stage renal disease: Secondary | ICD-10-CM | POA: Diagnosis not present

## 2023-01-22 DIAGNOSIS — N2581 Secondary hyperparathyroidism of renal origin: Secondary | ICD-10-CM | POA: Diagnosis not present

## 2023-01-22 DIAGNOSIS — L03116 Cellulitis of left lower limb: Secondary | ICD-10-CM | POA: Diagnosis not present

## 2023-01-22 DIAGNOSIS — E1122 Type 2 diabetes mellitus with diabetic chronic kidney disease: Secondary | ICD-10-CM | POA: Diagnosis not present

## 2023-01-22 DIAGNOSIS — E1151 Type 2 diabetes mellitus with diabetic peripheral angiopathy without gangrene: Secondary | ICD-10-CM | POA: Diagnosis not present

## 2023-01-22 DIAGNOSIS — G8929 Other chronic pain: Secondary | ICD-10-CM | POA: Diagnosis not present

## 2023-01-22 DIAGNOSIS — L97821 Non-pressure chronic ulcer of other part of left lower leg limited to breakdown of skin: Secondary | ICD-10-CM | POA: Diagnosis not present

## 2023-01-22 DIAGNOSIS — Z89611 Acquired absence of right leg above knee: Secondary | ICD-10-CM | POA: Diagnosis not present

## 2023-01-25 ENCOUNTER — Telehealth: Payer: Self-pay | Admitting: Nurse Practitioner

## 2023-01-25 ENCOUNTER — Telehealth: Payer: Self-pay

## 2023-01-25 DIAGNOSIS — N2581 Secondary hyperparathyroidism of renal origin: Secondary | ICD-10-CM | POA: Diagnosis not present

## 2023-01-25 DIAGNOSIS — N186 End stage renal disease: Secondary | ICD-10-CM | POA: Diagnosis not present

## 2023-01-25 DIAGNOSIS — Z992 Dependence on renal dialysis: Secondary | ICD-10-CM | POA: Diagnosis not present

## 2023-01-25 NOTE — Telephone Encounter (Signed)
Home Health Verbal Orders - Caller/Agency: Trey Paula with [facility]  Callback Number: [phone]  Service Requested:  social worker community resources

## 2023-01-25 NOTE — Telephone Encounter (Signed)
Patient's niece Angelique Blonder called to advise that she needs assistance to have the patient's seat and bath chair, picked up. She stated that she was planning to pick it up, but her vehicle is having trouble. Also, she would like the patient to have a chair with wheels for both walking and standing. Caller requests that someone call her at [phone] to advise if assistance can be provided.

## 2023-01-26 ENCOUNTER — Ambulatory Visit: Payer: Self-pay | Admitting: *Deleted

## 2023-01-26 DIAGNOSIS — Z89611 Acquired absence of right leg above knee: Secondary | ICD-10-CM | POA: Diagnosis not present

## 2023-01-26 DIAGNOSIS — I482 Chronic atrial fibrillation, unspecified: Secondary | ICD-10-CM | POA: Diagnosis not present

## 2023-01-26 DIAGNOSIS — I120 Hypertensive chronic kidney disease with stage 5 chronic kidney disease or end stage renal disease: Secondary | ICD-10-CM | POA: Diagnosis not present

## 2023-01-26 DIAGNOSIS — G894 Chronic pain syndrome: Secondary | ICD-10-CM | POA: Diagnosis not present

## 2023-01-26 DIAGNOSIS — E1151 Type 2 diabetes mellitus with diabetic peripheral angiopathy without gangrene: Secondary | ICD-10-CM | POA: Diagnosis not present

## 2023-01-26 DIAGNOSIS — Z79899 Other long term (current) drug therapy: Secondary | ICD-10-CM | POA: Diagnosis not present

## 2023-01-26 DIAGNOSIS — Z992 Dependence on renal dialysis: Secondary | ICD-10-CM | POA: Diagnosis not present

## 2023-01-26 DIAGNOSIS — N186 End stage renal disease: Secondary | ICD-10-CM | POA: Diagnosis not present

## 2023-01-26 DIAGNOSIS — L97821 Non-pressure chronic ulcer of other part of left lower leg limited to breakdown of skin: Secondary | ICD-10-CM | POA: Diagnosis not present

## 2023-01-26 DIAGNOSIS — L03116 Cellulitis of left lower limb: Secondary | ICD-10-CM | POA: Diagnosis not present

## 2023-01-26 DIAGNOSIS — G8929 Other chronic pain: Secondary | ICD-10-CM | POA: Diagnosis not present

## 2023-01-26 DIAGNOSIS — I1310 Hypertensive heart and chronic kidney disease without heart failure, with stage 1 through stage 4 chronic kidney disease, or unspecified chronic kidney disease: Secondary | ICD-10-CM | POA: Diagnosis not present

## 2023-01-26 DIAGNOSIS — E1122 Type 2 diabetes mellitus with diabetic chronic kidney disease: Secondary | ICD-10-CM | POA: Diagnosis not present

## 2023-01-26 NOTE — Patient Instructions (Signed)
Visit Information  Thank you for taking time to visit with me today. Please don't hesitate to contact me if I can be of assistance to you.   Following are the goals we discussed today:   Goals Addressed             This Visit's Progress    referral to the Community Alternatives Program       Activities and task to complete in order to accomplish goals.   REFERRAL PLACED BY SOCIAL WORK  I have confirmed that [facility] has received the referral forms for CAPS services. It is currently in review.  Once reviewed, the Department of Health and Human Services will contact you to schedule a in hme assessment.          Our next appointment is by telephone on 02/12/23 at 11am  Please call the care guide team at [phone] if you need to cancel or reschedule your appointment.   If you are experiencing a Mental Health or Behavioral Health Crisis or need someone to talk to, please call [phone]   Patient verbalizes understanding of instructions and care plan provided today and agrees to view in MyChart. Active MyChart status and patient understanding of how to access instructions and care plan via MyChart confirmed with patient.     Telephone follow up appointment with care management team member scheduled for: 02/12/23  Verna Czech, LCSW [place]  [facility] Licensed Clinical Social Worker Care Coordinator  Direct Dial: [phone]

## 2023-01-26 NOTE — Telephone Encounter (Signed)
Called and gave verbal order per Jolene.  °

## 2023-01-26 NOTE — Patient Outreach (Addendum)
  Care Coordination   Follow Up Visit Note   01/26/2023 Name: Donovon Middlemiss MRN: [id number] DOB: 06-16-1960  Eliyas Noor is a 62 y.o. year old male who sees Aura Dials T, NP for primary care. I spoke with  Hurman Horn Bally's niece by phone today.  What matters to the patients health and wellness today?  Patient's niece would like assistance with CAP services referral for extended in home care.    Goals Addressed             This Visit's Progress    referral to the Community Alternatives Program       Activities and task to complete in order to accomplish goals.   REFERRAL PLACED BY SOCIAL WORK  I have confirmed that [facility] has received the referral forms for CAPS services. It is currently in review.  Once reviewed, the Department of Health and Human Services will contact you to schedule a in hme assessment.          SDOH assessments and interventions completed:  No     Care Coordination Interventions:  Yes, provided  Interventions Today    Flowsheet Row Most Recent Value  Chronic Disease   Chronic disease during today's visit Hypertension (HTN)  General Interventions   General Interventions Discussed/Reviewed General Interventions Reviewed, Level of Care, Communication with, Durable Medical Equipment (DME)  [needs assessment completed-patient continues to receive [facility] services as well as in home aid services-pt's caregiver/ niece requesting assistance with getting additional hours for care]  Doctor Visits Discussed/Reviewed Doctor Visits Discussed, Specialist  [[facility]ney [facility], PCP 11/18, 11/25  pain mgmt, 11/26 wound care , 12/18 Vascular niece reminded to schedule transportation]  Durable Medical Equipment (DME) Other  [confirmed that  seat and bath chair have been ordered and will be delivered to patient's home next week]  PCP/Specialist Visits Compliance with follow-up visit  Communication with --  [[facility]-community alternatives  program-confirmed that they have patient's referral for CAP services-currently in review]  Level of Care Personal Care Services  [continues to receive PCS services-2 hours per day]       Follow up plan: Follow up call scheduled for 02/12/23    Encounter Outcome:  Patient Visit Completed

## 2023-01-27 ENCOUNTER — Telehealth: Payer: Self-pay | Admitting: Nurse Practitioner

## 2023-01-27 DIAGNOSIS — N2581 Secondary hyperparathyroidism of renal origin: Secondary | ICD-10-CM | POA: Diagnosis not present

## 2023-01-27 DIAGNOSIS — Z992 Dependence on renal dialysis: Secondary | ICD-10-CM | POA: Diagnosis not present

## 2023-01-27 DIAGNOSIS — N186 End stage renal disease: Secondary | ICD-10-CM | POA: Diagnosis not present

## 2023-01-27 NOTE — Telephone Encounter (Signed)
Pt is calling in because he would like for Adam Howell to give him a referral for a facility to help with his leg.

## 2023-01-28 NOTE — Telephone Encounter (Signed)
Spoke with patient to clarify previous telephone encounter. Patient says he would like to go somewhere he can stay and do his rehab for about two weeks. Please advise?

## 2023-01-29 DIAGNOSIS — L97821 Non-pressure chronic ulcer of other part of left lower leg limited to breakdown of skin: Secondary | ICD-10-CM | POA: Diagnosis not present

## 2023-01-29 DIAGNOSIS — N2581 Secondary hyperparathyroidism of renal origin: Secondary | ICD-10-CM | POA: Diagnosis not present

## 2023-01-29 DIAGNOSIS — Z89611 Acquired absence of right leg above knee: Secondary | ICD-10-CM | POA: Diagnosis not present

## 2023-01-29 DIAGNOSIS — E1151 Type 2 diabetes mellitus with diabetic peripheral angiopathy without gangrene: Secondary | ICD-10-CM | POA: Diagnosis not present

## 2023-01-29 DIAGNOSIS — L03116 Cellulitis of left lower limb: Secondary | ICD-10-CM | POA: Diagnosis not present

## 2023-01-29 DIAGNOSIS — E1122 Type 2 diabetes mellitus with diabetic chronic kidney disease: Secondary | ICD-10-CM | POA: Diagnosis not present

## 2023-01-29 DIAGNOSIS — Z992 Dependence on renal dialysis: Secondary | ICD-10-CM | POA: Diagnosis not present

## 2023-01-29 DIAGNOSIS — I120 Hypertensive chronic kidney disease with stage 5 chronic kidney disease or end stage renal disease: Secondary | ICD-10-CM | POA: Diagnosis not present

## 2023-01-29 DIAGNOSIS — G8929 Other chronic pain: Secondary | ICD-10-CM | POA: Diagnosis not present

## 2023-01-29 DIAGNOSIS — N186 End stage renal disease: Secondary | ICD-10-CM | POA: Diagnosis not present

## 2023-01-31 NOTE — Patient Instructions (Signed)

## 2023-02-01 ENCOUNTER — Ambulatory Visit: Payer: Medicare HMO | Admitting: Nurse Practitioner

## 2023-02-01 DIAGNOSIS — Z992 Dependence on renal dialysis: Secondary | ICD-10-CM | POA: Diagnosis not present

## 2023-02-01 DIAGNOSIS — N2581 Secondary hyperparathyroidism of renal origin: Secondary | ICD-10-CM | POA: Diagnosis not present

## 2023-02-01 DIAGNOSIS — N186 End stage renal disease: Secondary | ICD-10-CM | POA: Diagnosis not present

## 2023-02-02 ENCOUNTER — Ambulatory Visit (INDEPENDENT_AMBULATORY_CARE_PROVIDER_SITE_OTHER): Payer: Medicare HMO | Admitting: Nurse Practitioner

## 2023-02-02 ENCOUNTER — Encounter: Payer: Self-pay | Admitting: Nurse Practitioner

## 2023-02-02 ENCOUNTER — Ambulatory Visit: Payer: Self-pay | Admitting: *Deleted

## 2023-02-02 VITALS — BP 93/55 | HR 90 | Temp 98.1°F | Wt 265.0 lb

## 2023-02-02 DIAGNOSIS — L03116 Cellulitis of left lower limb: Secondary | ICD-10-CM | POA: Diagnosis not present

## 2023-02-02 DIAGNOSIS — E1151 Type 2 diabetes mellitus with diabetic peripheral angiopathy without gangrene: Secondary | ICD-10-CM | POA: Diagnosis not present

## 2023-02-02 DIAGNOSIS — L97921 Non-pressure chronic ulcer of unspecified part of left lower leg limited to breakdown of skin: Secondary | ICD-10-CM | POA: Diagnosis not present

## 2023-02-02 DIAGNOSIS — E1122 Type 2 diabetes mellitus with diabetic chronic kidney disease: Secondary | ICD-10-CM | POA: Diagnosis not present

## 2023-02-02 DIAGNOSIS — Z992 Dependence on renal dialysis: Secondary | ICD-10-CM | POA: Diagnosis not present

## 2023-02-02 DIAGNOSIS — I120 Hypertensive chronic kidney disease with stage 5 chronic kidney disease or end stage renal disease: Secondary | ICD-10-CM | POA: Diagnosis not present

## 2023-02-02 DIAGNOSIS — N186 End stage renal disease: Secondary | ICD-10-CM | POA: Diagnosis not present

## 2023-02-02 DIAGNOSIS — L97821 Non-pressure chronic ulcer of other part of left lower leg limited to breakdown of skin: Secondary | ICD-10-CM | POA: Diagnosis not present

## 2023-02-02 DIAGNOSIS — Z89611 Acquired absence of right leg above knee: Secondary | ICD-10-CM | POA: Diagnosis not present

## 2023-02-02 DIAGNOSIS — G8929 Other chronic pain: Secondary | ICD-10-CM | POA: Diagnosis not present

## 2023-02-02 MED ORDER — HYDROCODONE-ACETAMINOPHEN 10-325 MG PO TABS
1.0000 | ORAL_TABLET | Freq: Three times a day (TID) | ORAL | 0 refills | Status: DC | PRN
Start: 2023-02-02 — End: 2023-03-04

## 2023-02-02 NOTE — Progress Notes (Signed)
BP (!) 93/55   Pulse 90   Temp 98.1 F (36.7 C) (Oral)   Wt 265 lb (120.2 kg) Comment: patient reported  BMI 33.12 kg/m    Subjective:    [name], male    DOB: [birth year], 62 y.o.   MRN: [id number]  HPI: Adam Howell is a 61 y.o. male  Chief Complaint  Patient presents with   Wound Check   WOUND CHECK Follow-up for wound check to left lower leg which he was first seen for on 12/10/22.  Wound care coming to home, they called today to discuss concern for infection as there is odor.  Sees wound clinic next week.  Saw vascular on 01/18/23, was noted to have adequate perfusion but are discussing angiogram to improve perfusion.  To return to see vascular in 6 weeks.  Last Norco fill was from [facility] on 01/26/23 for 5 days supply, they have referred him to an alternate pain clinic as he needs stronger pain meds which they do not do.  Goes to new pain clinic on 26th.  Continues to have pain to leg and is out of pain medication at present.  Had above knee amputation to right leg on 03/13/22, overall healing well and has prosthetic, but has not learned how to use.   Duration: months Location: left lower leg History of trauma in area: no Pain: yes Quality: yes Severity: 7/10 Redness: yes Swelling: no Oozing: no Pus: no Fevers: no Nausea/vomiting: no Status: fluctuating Treatments attempted:current wound care present in home  Tetanus: UTD   Relevant past medical, surgical, family and social history reviewed and updated as indicated. Interim medical history since our last visit reviewed. Allergies and medications reviewed and updated.  Review of Systems  Constitutional:  Negative for activity change, diaphoresis, fatigue and fever.  Respiratory:  Negative for cough, chest tightness, shortness of breath and wheezing.   Cardiovascular:  Negative for chest pain, palpitations and leg swelling.  Skin:  Positive for wound.  Neurological:  Negative.   Psychiatric/Behavioral: Negative.      Per HPI unless specifically indicated above     Objective:    BP (!) 93/55   Pulse 90   Temp 98.1 F (36.7 C) (Oral)   Wt 265 lb (120.2 kg) Comment: patient reported  BMI 33.12 kg/m   Wt Readings from Last 3 Encounters:  02/02/23 265 lb (120.2 kg)  01/18/23 265 lb (120.2 kg)  12/24/22 265 lb (120.2 kg)    Physical Exam Vitals and nursing note reviewed.  Constitutional:      General: He is awake. He is not in acute distress.    Appearance: He is well-developed and well-groomed. He is obese. He is not ill-appearing or toxic-appearing.  HENT:     Head: Normocephalic and atraumatic.     Right Ear: Hearing normal. No drainage.     Left Ear: Hearing normal. No drainage.  Eyes:     General: Lids are normal.        Right eye: No discharge.        Left eye: No discharge.     Conjunctiva/sclera: Conjunctivae normal.     Pupils: Pupils are equal, round, and reactive to light.  Neck:     Thyroid: No thyromegaly.     Vascular: No carotid bruit.     Trachea: Trachea normal.  Cardiovascular:     Rate and Rhythm: Normal rate and regular rhythm.     Pulses:  Dorsalis pedis pulses are 1+ on the left side.       Posterior tibial pulses are 1+ on the left side.     Heart sounds: Normal heart sounds, S1 normal and S2 normal. No murmur heard.    No gallop.     Arteriovenous access: Left arteriovenous access is present.    Comments: Decreased hair pattern bilateral legs. Pulmonary:     Effort: Pulmonary effort is normal. No accessory muscle usage or respiratory distress.     Breath sounds: Normal breath sounds.  Abdominal:     General: Bowel sounds are normal. There is no distension.     Palpations: Abdomen is soft.     Tenderness: There is no abdominal tenderness.  Musculoskeletal:        General: Normal range of motion.     Cervical back: Normal range of motion and neck supple.     Left lower leg: No edema.     Right  Lower Extremity: Right leg is amputated above knee.  Skin:    General: Skin is warm and dry.     Capillary Refill: Capillary refill takes less than 2 seconds.     Findings: No rash.     Comments: New dressing in place from this morning by home health.  Pulled back to look at wound.  Odor is present to area and there is a thick yellow slough present over wound bed and one darker area to lower midline aspect.  No redness to wound or warmth.    Neurological:     Mental Status: [name] and oriented to person, place, and time.     Deep Tendon Reflexes: Reflexes are normal and symmetric.  Psychiatric:        Attention and Perception: Attention normal.        Mood and Affect: Mood normal.        Speech: Speech normal.        Behavior: Behavior normal. Behavior is cooperative.        Thought Content: Thought content normal.        Judgment: Judgment normal.     Results for orders placed or performed in visit on 01/12/23  CBC with Differential/Platelet  Result Value Ref Range   WBC 5.5 3.4 - 10.8 x10E3/uL   RBC 5.83 (H) 4.14 - 5.80 x10E6/uL   Hemoglobin 16.8 13.0 - 17.7 g/dL   Hematocrit 16.1 09.6 - 51.0 %   MCV 88 79 - 97 fL   MCH 28.8 26.6 - 33.0 pg   MCHC 32.9 31.5 - 35.7 g/dL   RDW 04.5 40.9 - 81.1 %   Platelets 257 150 - 450 x10E3/uL   Neutrophils 67 Not Estab. %   Lymphs 19 Not Estab. %   Monocytes 10 Not Estab. %   Eos 3 Not Estab. %   Basos 1 Not Estab. %   Neutrophils Absolute 3.7 1.4 - 7.0 x10E3/uL   Lymphocytes Absolute 1.0 0.7 - 3.1 x10E3/uL   Monocytes Absolute 0.5 0.1 - 0.9 x10E3/uL   EOS (ABSOLUTE) 0.2 0.0 - 0.4 x10E3/uL   Basophils Absolute 0.1 0.0 - 0.2 x10E3/uL   Immature Granulocytes 0 Not Estab. %   Immature Grans (Abs) 0.0 0.0 - 0.1 x10E3/uL  Lipid Panel w/o Chol/HDL Ratio  Result Value Ref Range   Cholesterol, Total 179 100 - 199 mg/dL   Triglycerides 914 0 - 149 mg/dL   HDL 43 >78 mg/dL   VLDL Cholesterol Cal 19 5 - 40  mg/dL   LDL Chol Calc (NIH) 161  (H) 0 - 99 mg/dL  Bayer DCA Hb W9U Waived  Result Value Ref Range   HB A1C (BAYER DCA - WAIVED) 5.9 (H) 4.8 - 5.6 %      Assessment & Plan:   Problem List Items Addressed This Visit       Musculoskeletal and Integument   Ulcer of left lower extremity, limited to breakdown of skin (HCC) - Primary    Ongoing since prior to 12/10/22.  Concern as history of ulcerations to right leg leading to AKA. He has seen vascular who are considering angiogram in future, recent note reviewed.  Seeing wound clinic next week and going to new pain clinic who prescribes stronger pain medication then currently taking, as continues to have pain to lower left leg.  Wound has odor today and is not improving.  Will start Doxycycline BID for 7 days, recommend if worsening or fever presents go to ER immediately as may need IV abx.  Will refill Norco for enough supply to make it to 26th when sees new pain provider.  Continue wound nurse visits at home.          Follow up plan: Return in about 2 weeks (around 02/16/2023) for WOUND CHECK.

## 2023-02-02 NOTE — Telephone Encounter (Signed)
Reason for Disposition  Wound infection suspected by triager (e.g., other signs of wound infection)  Answer Assessment - Initial Assessment Questions 1. LOCATION: "Where is the wound located?"      L leg- above ankle 2. WOUND APPEARANCE: "What does the wound look like?"      Wrapped/dressed- Friday 3. SIZE: If redness is present, ask: "What is the size of the red area?" (Inches, centimeters, or compare to size of a coin)      Weeping on dressing  7. PAIN: Do you have any pain?"  If Yes, ask: "How bad is the pain?"  (e.g., Scale 1-10; mild, moderate, or severe)    - MILD (1-3): Doesn't interfere with normal activities.     - MODERATE (4-7): Interferes with normal activities or awakens from sleep.    - SEVERE (8-10): Excruciating pain, unable to do any normal activities.       no 8. FEVER: "Do you have a fever?" If Yes, ask: "What is your temperature, how was it measured, and when did it start?"     No fever 9. OTHER SYMPTOMS: "Do you have any other symptoms?" (e.g., shaking chills, weakness, rash elsewhere on body)    Strong Odor from wound,  pain last week- but no pain complaint yesterday  Protocols used: Wound Infection Suspected-A-AH

## 2023-02-02 NOTE — Telephone Encounter (Signed)
Adam Howell nurse Chief Complaint:reporting strong odor from wound dressing Symptoms: odor, weeping from wound dressing Frequency: patient was in office yesterday Pertinent Negatives: Patient denies pain/fever Disposition: [] ED /[] Urgent Care (no appt availability in office) / [] Appointment(In office/virtual)/ []  [place] [facility]/ [] Home Care/ [] Refused Recommended Disposition /[] [place] [facility]/ [x]  Follow-up with PCP Additional Notes: Stanton Kidney tried to call  wound care yesterday- but was not sure she was given correct number- she saw where patient had appointment with PCP today- she wanted to make sure patient followed up due to concern about wound infection.

## 2023-02-02 NOTE — Telephone Encounter (Signed)
FYI to provider for today's appointment.

## 2023-02-02 NOTE — Telephone Encounter (Signed)
Noted and abx sent in.

## 2023-02-02 NOTE — Assessment & Plan Note (Addendum)
Ongoing since prior to 12/10/22.  Concern as history of ulcerations to right leg leading to AKA. He has seen vascular who are considering angiogram in future, recent note reviewed.  Seeing wound clinic next week and going to new pain clinic who prescribes stronger pain medication then currently taking, as continues to have pain to lower left leg.  Wound has odor today and is not improving.  Will start Doxycycline BID for 7 days, recommend if worsening or fever presents go to ER immediately as may need IV abx.  Will refill Norco for enough supply to make it to 26th when sees new pain provider.  Continue wound nurse visits at home.

## 2023-02-03 DIAGNOSIS — E1151 Type 2 diabetes mellitus with diabetic peripheral angiopathy without gangrene: Secondary | ICD-10-CM | POA: Diagnosis not present

## 2023-02-03 DIAGNOSIS — N2581 Secondary hyperparathyroidism of renal origin: Secondary | ICD-10-CM | POA: Diagnosis not present

## 2023-02-03 DIAGNOSIS — E1122 Type 2 diabetes mellitus with diabetic chronic kidney disease: Secondary | ICD-10-CM | POA: Diagnosis not present

## 2023-02-03 DIAGNOSIS — L03116 Cellulitis of left lower limb: Secondary | ICD-10-CM | POA: Diagnosis not present

## 2023-02-03 DIAGNOSIS — L97821 Non-pressure chronic ulcer of other part of left lower leg limited to breakdown of skin: Secondary | ICD-10-CM | POA: Diagnosis not present

## 2023-02-03 DIAGNOSIS — N186 End stage renal disease: Secondary | ICD-10-CM | POA: Diagnosis not present

## 2023-02-03 DIAGNOSIS — I120 Hypertensive chronic kidney disease with stage 5 chronic kidney disease or end stage renal disease: Secondary | ICD-10-CM | POA: Diagnosis not present

## 2023-02-03 DIAGNOSIS — Z992 Dependence on renal dialysis: Secondary | ICD-10-CM | POA: Diagnosis not present

## 2023-02-03 DIAGNOSIS — G8929 Other chronic pain: Secondary | ICD-10-CM | POA: Diagnosis not present

## 2023-02-03 DIAGNOSIS — Z89611 Acquired absence of right leg above knee: Secondary | ICD-10-CM | POA: Diagnosis not present

## 2023-02-05 DIAGNOSIS — E1122 Type 2 diabetes mellitus with diabetic chronic kidney disease: Secondary | ICD-10-CM | POA: Diagnosis not present

## 2023-02-05 DIAGNOSIS — Z992 Dependence on renal dialysis: Secondary | ICD-10-CM | POA: Diagnosis not present

## 2023-02-05 DIAGNOSIS — Z89611 Acquired absence of right leg above knee: Secondary | ICD-10-CM | POA: Diagnosis not present

## 2023-02-05 DIAGNOSIS — N186 End stage renal disease: Secondary | ICD-10-CM | POA: Diagnosis not present

## 2023-02-05 DIAGNOSIS — E1151 Type 2 diabetes mellitus with diabetic peripheral angiopathy without gangrene: Secondary | ICD-10-CM | POA: Diagnosis not present

## 2023-02-05 DIAGNOSIS — I120 Hypertensive chronic kidney disease with stage 5 chronic kidney disease or end stage renal disease: Secondary | ICD-10-CM | POA: Diagnosis not present

## 2023-02-05 DIAGNOSIS — G8929 Other chronic pain: Secondary | ICD-10-CM | POA: Diagnosis not present

## 2023-02-05 DIAGNOSIS — L03116 Cellulitis of left lower limb: Secondary | ICD-10-CM | POA: Diagnosis not present

## 2023-02-05 DIAGNOSIS — N2581 Secondary hyperparathyroidism of renal origin: Secondary | ICD-10-CM | POA: Diagnosis not present

## 2023-02-05 DIAGNOSIS — L97821 Non-pressure chronic ulcer of other part of left lower leg limited to breakdown of skin: Secondary | ICD-10-CM | POA: Diagnosis not present

## 2023-02-07 DIAGNOSIS — N2581 Secondary hyperparathyroidism of renal origin: Secondary | ICD-10-CM | POA: Diagnosis not present

## 2023-02-07 DIAGNOSIS — N186 End stage renal disease: Secondary | ICD-10-CM | POA: Diagnosis not present

## 2023-02-07 DIAGNOSIS — Z992 Dependence on renal dialysis: Secondary | ICD-10-CM | POA: Diagnosis not present

## 2023-02-08 DIAGNOSIS — Z992 Dependence on renal dialysis: Secondary | ICD-10-CM | POA: Diagnosis not present

## 2023-02-08 DIAGNOSIS — N186 End stage renal disease: Secondary | ICD-10-CM | POA: Diagnosis not present

## 2023-02-08 DIAGNOSIS — M726 Necrotizing fasciitis: Secondary | ICD-10-CM | POA: Diagnosis not present

## 2023-02-08 DIAGNOSIS — M129 Arthropathy, unspecified: Secondary | ICD-10-CM | POA: Diagnosis not present

## 2023-02-08 DIAGNOSIS — Z79899 Other long term (current) drug therapy: Secondary | ICD-10-CM | POA: Diagnosis not present

## 2023-02-08 DIAGNOSIS — E559 Vitamin D deficiency, unspecified: Secondary | ICD-10-CM | POA: Diagnosis not present

## 2023-02-08 DIAGNOSIS — G894 Chronic pain syndrome: Secondary | ICD-10-CM | POA: Diagnosis not present

## 2023-02-08 DIAGNOSIS — E1122 Type 2 diabetes mellitus with diabetic chronic kidney disease: Secondary | ICD-10-CM | POA: Diagnosis not present

## 2023-02-08 DIAGNOSIS — Z1159 Encounter for screening for other viral diseases: Secondary | ICD-10-CM | POA: Diagnosis not present

## 2023-02-08 DIAGNOSIS — Z9181 History of falling: Secondary | ICD-10-CM | POA: Diagnosis not present

## 2023-02-09 ENCOUNTER — Other Ambulatory Visit: Payer: Self-pay

## 2023-02-09 ENCOUNTER — Inpatient Hospital Stay: Payer: Medicare HMO

## 2023-02-09 ENCOUNTER — Ambulatory Visit: Payer: Medicare HMO | Admitting: Physician Assistant

## 2023-02-09 ENCOUNTER — Inpatient Hospital Stay
Admission: EM | Admit: 2023-02-09 | Discharge: 2023-03-04 | DRG: 252 | Disposition: A | Discharge status: Skilled Nursing Facility | Payer: Medicare HMO | Attending: Student | Admitting: Student

## 2023-02-09 ENCOUNTER — Emergency Department: Payer: Medicare HMO

## 2023-02-09 DIAGNOSIS — L97529 Non-pressure chronic ulcer of other part of left foot with unspecified severity: Secondary | ICD-10-CM | POA: Diagnosis present

## 2023-02-09 DIAGNOSIS — I132 Hypertensive heart and chronic kidney disease with heart failure and with stage 5 chronic kidney disease, or end stage renal disease: Secondary | ICD-10-CM | POA: Diagnosis present

## 2023-02-09 DIAGNOSIS — Z992 Dependence on renal dialysis: Secondary | ICD-10-CM

## 2023-02-09 DIAGNOSIS — Z8249 Family history of ischemic heart disease and other diseases of the circulatory system: Secondary | ICD-10-CM

## 2023-02-09 DIAGNOSIS — R41841 Cognitive communication deficit: Secondary | ICD-10-CM | POA: Diagnosis not present

## 2023-02-09 DIAGNOSIS — L89312 Pressure ulcer of right buttock, stage 2: Secondary | ICD-10-CM | POA: Diagnosis present

## 2023-02-09 DIAGNOSIS — L98492 Non-pressure chronic ulcer of skin of other sites with fat layer exposed: Secondary | ICD-10-CM | POA: Diagnosis not present

## 2023-02-09 DIAGNOSIS — M7989 Other specified soft tissue disorders: Secondary | ICD-10-CM | POA: Diagnosis not present

## 2023-02-09 DIAGNOSIS — Z7901 Long term (current) use of anticoagulants: Secondary | ICD-10-CM

## 2023-02-09 DIAGNOSIS — M79605 Pain in left leg: Secondary | ICD-10-CM | POA: Diagnosis not present

## 2023-02-09 DIAGNOSIS — Z872 Personal history of diseases of the skin and subcutaneous tissue: Secondary | ICD-10-CM | POA: Diagnosis not present

## 2023-02-09 DIAGNOSIS — R69 Illness, unspecified: Secondary | ICD-10-CM | POA: Diagnosis not present

## 2023-02-09 DIAGNOSIS — E11621 Type 2 diabetes mellitus with foot ulcer: Secondary | ICD-10-CM | POA: Diagnosis present

## 2023-02-09 DIAGNOSIS — L089 Local infection of the skin and subcutaneous tissue, unspecified: Secondary | ICD-10-CM | POA: Insufficient documentation

## 2023-02-09 DIAGNOSIS — L03116 Cellulitis of left lower limb: Secondary | ICD-10-CM | POA: Diagnosis not present

## 2023-02-09 DIAGNOSIS — Z5982 Transportation insecurity: Secondary | ICD-10-CM

## 2023-02-09 DIAGNOSIS — L89159 Pressure ulcer of sacral region, unspecified stage: Secondary | ICD-10-CM | POA: Diagnosis not present

## 2023-02-09 DIAGNOSIS — E1169 Type 2 diabetes mellitus with other specified complication: Secondary | ICD-10-CM | POA: Diagnosis not present

## 2023-02-09 DIAGNOSIS — I120 Hypertensive chronic kidney disease with stage 5 chronic kidney disease or end stage renal disease: Secondary | ICD-10-CM | POA: Diagnosis not present

## 2023-02-09 DIAGNOSIS — Z885 Allergy status to narcotic agent status: Secondary | ICD-10-CM

## 2023-02-09 DIAGNOSIS — Z743 Need for continuous supervision: Secondary | ICD-10-CM | POA: Diagnosis not present

## 2023-02-09 DIAGNOSIS — N186 End stage renal disease: Secondary | ICD-10-CM

## 2023-02-09 DIAGNOSIS — D631 Anemia in chronic kidney disease: Secondary | ICD-10-CM | POA: Diagnosis not present

## 2023-02-09 DIAGNOSIS — E611 Iron deficiency: Secondary | ICD-10-CM | POA: Diagnosis present

## 2023-02-09 DIAGNOSIS — E875 Hyperkalemia: Secondary | ICD-10-CM | POA: Diagnosis present

## 2023-02-09 DIAGNOSIS — E1122 Type 2 diabetes mellitus with diabetic chronic kidney disease: Secondary | ICD-10-CM | POA: Diagnosis not present

## 2023-02-09 DIAGNOSIS — E1151 Type 2 diabetes mellitus with diabetic peripheral angiopathy without gangrene: Principal | ICD-10-CM | POA: Diagnosis present

## 2023-02-09 DIAGNOSIS — Z89611 Acquired absence of right leg above knee: Secondary | ICD-10-CM

## 2023-02-09 DIAGNOSIS — I5032 Chronic diastolic (congestive) heart failure: Secondary | ICD-10-CM | POA: Diagnosis present

## 2023-02-09 DIAGNOSIS — N2581 Secondary hyperparathyroidism of renal origin: Secondary | ICD-10-CM | POA: Diagnosis not present

## 2023-02-09 DIAGNOSIS — E785 Hyperlipidemia, unspecified: Secondary | ICD-10-CM | POA: Diagnosis present

## 2023-02-09 DIAGNOSIS — Z79891 Long term (current) use of opiate analgesic: Secondary | ICD-10-CM

## 2023-02-09 DIAGNOSIS — L97921 Non-pressure chronic ulcer of unspecified part of left lower leg limited to breakdown of skin: Secondary | ICD-10-CM | POA: Diagnosis not present

## 2023-02-09 DIAGNOSIS — L89322 Pressure ulcer of left buttock, stage 2: Secondary | ICD-10-CM | POA: Diagnosis not present

## 2023-02-09 DIAGNOSIS — Z751 Person awaiting admission to adequate facility elsewhere: Secondary | ICD-10-CM

## 2023-02-09 DIAGNOSIS — M6281 Muscle weakness (generalized): Secondary | ICD-10-CM | POA: Diagnosis not present

## 2023-02-09 DIAGNOSIS — E669 Obesity, unspecified: Secondary | ICD-10-CM | POA: Diagnosis present

## 2023-02-09 DIAGNOSIS — I482 Chronic atrial fibrillation, unspecified: Secondary | ICD-10-CM | POA: Diagnosis not present

## 2023-02-09 DIAGNOSIS — G894 Chronic pain syndrome: Secondary | ICD-10-CM | POA: Diagnosis present

## 2023-02-09 DIAGNOSIS — R2681 Unsteadiness on feet: Secondary | ICD-10-CM | POA: Diagnosis not present

## 2023-02-09 DIAGNOSIS — E1165 Type 2 diabetes mellitus with hyperglycemia: Secondary | ICD-10-CM | POA: Diagnosis present

## 2023-02-09 DIAGNOSIS — R1310 Dysphagia, unspecified: Secondary | ICD-10-CM | POA: Diagnosis not present

## 2023-02-09 DIAGNOSIS — R488 Other symbolic dysfunctions: Secondary | ICD-10-CM | POA: Diagnosis not present

## 2023-02-09 DIAGNOSIS — E559 Vitamin D deficiency, unspecified: Secondary | ICD-10-CM | POA: Diagnosis present

## 2023-02-09 DIAGNOSIS — R197 Diarrhea, unspecified: Secondary | ICD-10-CM | POA: Diagnosis not present

## 2023-02-09 DIAGNOSIS — T40695A Adverse effect of other narcotics, initial encounter: Secondary | ICD-10-CM | POA: Diagnosis present

## 2023-02-09 DIAGNOSIS — I2510 Atherosclerotic heart disease of native coronary artery without angina pectoris: Secondary | ICD-10-CM | POA: Diagnosis present

## 2023-02-09 DIAGNOSIS — Z79899 Other long term (current) drug therapy: Secondary | ICD-10-CM

## 2023-02-09 DIAGNOSIS — Z6832 Body mass index (BMI) 32.0-32.9, adult: Secondary | ICD-10-CM

## 2023-02-09 DIAGNOSIS — T148XXA Other injury of unspecified body region, initial encounter: Secondary | ICD-10-CM | POA: Diagnosis not present

## 2023-02-09 DIAGNOSIS — S78111A Complete traumatic amputation at level between right hip and knee, initial encounter: Secondary | ICD-10-CM | POA: Diagnosis not present

## 2023-02-09 DIAGNOSIS — K5903 Drug induced constipation: Secondary | ICD-10-CM | POA: Diagnosis present

## 2023-02-09 DIAGNOSIS — N189 Chronic kidney disease, unspecified: Secondary | ICD-10-CM | POA: Diagnosis present

## 2023-02-09 DIAGNOSIS — R600 Localized edema: Secondary | ICD-10-CM | POA: Diagnosis not present

## 2023-02-09 LAB — BASIC METABOLIC PANEL
Anion gap: 13 (ref 5–15)
BUN: 47 mg/dL — ABNORMAL HIGH (ref 8–23)
CO2: 29 mmol/L (ref 22–32)
Calcium: 8.6 mg/dL — ABNORMAL LOW (ref 8.9–10.3)
Chloride: 92 mmol/L — ABNORMAL LOW (ref 98–111)
Creatinine, Ser: 10.09 mg/dL — ABNORMAL HIGH (ref 0.61–1.24)
GFR, Estimated: 5 mL/min — ABNORMAL LOW (ref >60)
Glucose, Bld: 127 mg/dL — ABNORMAL HIGH (ref 70–99)
Potassium: 3.9 mmol/L (ref 3.5–5.1)
Sodium: 134 mmol/L — ABNORMAL LOW (ref 135–145)

## 2023-02-09 LAB — HEPATIC FUNCTION PANEL
ALT: 8 U/L (ref 0–44)
AST: 13 U/L — ABNORMAL LOW (ref 15–41)
Albumin: 3.3 g/dL — ABNORMAL LOW (ref 3.5–5.0)
Alkaline Phosphatase: 67 U/L (ref 38–126)
Bilirubin, Direct: 0.1 mg/dL (ref 0.0–0.2)
Indirect Bilirubin: 0.3 mg/dL (ref 0.3–0.9)
Total Bilirubin: 0.4 mg/dL (ref <1.2)
Total Protein: 7.8 g/dL (ref 6.5–8.1)

## 2023-02-09 LAB — CBC
HCT: 44.1 % (ref 39.0–52.0)
Hemoglobin: 14.8 g/dL (ref 13.0–17.0)
MCH: 28.5 pg (ref 26.0–34.0)
MCHC: 33.6 g/dL (ref 30.0–36.0)
MCV: 84.8 fL (ref 80.0–100.0)
Platelets: 250 10*3/uL (ref 150–400)
RBC: 5.2 MIL/uL (ref 4.22–5.81)
RDW: 15.5 % (ref 11.5–15.5)
WBC: 7.1 10*3/uL (ref 4.0–10.5)
nRBC: 0 % (ref 0.0–0.2)

## 2023-02-09 LAB — LACTIC ACID, PLASMA: Lactic Acid, Venous: 1.4 mmol/L (ref 0.5–1.9)

## 2023-02-09 MED ORDER — CHLORHEXIDINE GLUCONATE CLOTH 2 % EX PADS
6.0000 | MEDICATED_PAD | Freq: Every day | CUTANEOUS | Status: DC
Start: 2023-02-10 — End: 2023-02-24
  Administered 2023-02-10 – 2023-02-24 (×15): 6 via TOPICAL

## 2023-02-09 MED ORDER — HYDRALAZINE HCL 20 MG/ML IJ SOLN: 5.0000 mg | INTRAMUSCULAR | Status: DC | PRN

## 2023-02-09 MED ORDER — VANCOMYCIN HCL IN DEXTROSE 1-5 GM/200ML-% IV SOLN
1000.0000 mg | INTRAVENOUS | Status: DC
  Administered 2023-02-10: 1000 mg via INTRAVENOUS
  Filled 2023-02-09 (×2): qty 200

## 2023-02-09 MED ORDER — AMIODARONE HCL 200 MG PO TABS
200.0000 mg | ORAL_TABLET | Freq: Two times a day (BID) | ORAL | Status: DC
  Administered 2023-02-09 – 2023-03-04 (×43): 200 mg via ORAL
  Filled 2023-02-09 (×45): qty 1

## 2023-02-09 MED ORDER — CINACALCET HCL 30 MG PO TABS
30.0000 mg | ORAL_TABLET | Freq: Every day | ORAL | Status: DC
  Administered 2023-02-10 – 2023-03-04 (×19): 30 mg via ORAL
  Filled 2023-02-09 (×24): qty 1

## 2023-02-09 MED ORDER — CALCITRIOL 0.25 MCG PO CAPS
1.5000 ug | ORAL_CAPSULE | ORAL | Status: DC
Start: 2023-02-10 — End: 2023-03-04
  Administered 2023-02-12 – 2023-03-03 (×8): 1.5 ug via ORAL
  Filled 2023-02-09 (×9): qty 6

## 2023-02-09 MED ORDER — MEDIHONEY WOUND/BURN DRESSING EX PSTE
1.0000 | PASTE | Freq: Every day | CUTANEOUS | Status: DC
  Administered 2023-02-09 – 2023-03-04 (×22): 1 via TOPICAL
  Filled 2023-02-09 (×4): qty 44

## 2023-02-09 MED ORDER — MAGNESIUM HYDROXIDE 400 MG/5ML PO SUSP: 30.0000 mL | Freq: Every day | ORAL | Status: DC | PRN

## 2023-02-09 MED ORDER — SENNA 8.6 MG PO TABS
2.0000 | ORAL_TABLET | Freq: Every day | ORAL | Status: DC
  Administered 2023-02-09 – 2023-02-15 (×5): 17.2 mg via ORAL
  Filled 2023-02-09 (×7): qty 2

## 2023-02-09 MED ORDER — APIXABAN 2.5 MG PO TABS
2.5000 mg | ORAL_TABLET | Freq: Two times a day (BID) | ORAL | Status: DC
  Administered 2023-02-09 – 2023-03-04 (×43): 2.5 mg via ORAL
  Filled 2023-02-09 (×44): qty 1

## 2023-02-09 MED ORDER — RENA-VITE PO TABS
1.0000 | ORAL_TABLET | Freq: Every day | ORAL | Status: DC
  Administered 2023-02-09 – 2023-03-03 (×23): 1 via ORAL
  Filled 2023-02-09 (×24): qty 1

## 2023-02-09 MED ORDER — VANCOMYCIN HCL IN DEXTROSE 1-5 GM/200ML-% IV SOLN
1000.0000 mg | Freq: Once | INTRAVENOUS | Status: AC
  Administered 2023-02-09: 1000 mg via INTRAVENOUS
  Filled 2023-02-09: qty 200

## 2023-02-09 MED ORDER — HYDROMORPHONE HCL 1 MG/ML IJ SOLN
0.5000 mg | Freq: Once | INTRAMUSCULAR | Status: AC
  Administered 2023-02-09: 0.5 mg via INTRAVENOUS
  Filled 2023-02-09: qty 0.5

## 2023-02-09 MED ORDER — LORAZEPAM 2 MG/ML IJ SOLN: 1.0000 mg | Freq: Once | INTRAMUSCULAR | Status: DC | PRN

## 2023-02-09 MED ORDER — ONDANSETRON HCL 4 MG PO TABS: 4.0000 mg | ORAL_TABLET | Freq: Four times a day (QID) | ORAL | Status: DC | PRN

## 2023-02-09 MED ORDER — ACETAMINOPHEN 325 MG PO TABS
650.0000 mg | ORAL_TABLET | Freq: Four times a day (QID) | ORAL | Status: DC | PRN
  Administered 2023-02-16 – 2023-03-02 (×2): 650 mg via ORAL
  Filled 2023-02-09 (×2): qty 2

## 2023-02-09 MED ORDER — PREGABALIN 25 MG PO CAPS
25.0000 mg | ORAL_CAPSULE | Freq: Every day | ORAL | Status: DC
  Administered 2023-02-09 – 2023-03-04 (×24): 25 mg via ORAL
  Filled 2023-02-09 (×24): qty 1

## 2023-02-09 MED ORDER — VANCOMYCIN VARIABLE DOSE PER UNSTABLE RENAL FUNCTION (PHARMACIST DOSING): Status: DC

## 2023-02-09 MED ORDER — VANCOMYCIN HCL 1500 MG/300ML IV SOLN
1500.0000 mg | Freq: Once | INTRAVENOUS | Status: AC
  Administered 2023-02-09: 1500 mg via INTRAVENOUS
  Filled 2023-02-09: qty 300

## 2023-02-09 MED ORDER — ONDANSETRON HCL 4 MG/2ML IJ SOLN
4.0000 mg | Freq: Four times a day (QID) | INTRAMUSCULAR | Status: DC | PRN
  Administered 2023-02-13 – 2023-02-17 (×4): 4 mg via INTRAVENOUS
  Filled 2023-02-09 (×3): qty 2

## 2023-02-09 MED ORDER — ROSUVASTATIN CALCIUM 10 MG PO TABS
40.0000 mg | ORAL_TABLET | Freq: Every day | ORAL | Status: DC
  Administered 2023-02-09 – 2023-03-04 (×24): 40 mg via ORAL
  Filled 2023-02-09: qty 4
  Filled 2023-02-09: qty 2
  Filled 2023-02-09 (×8): qty 4
  Filled 2023-02-09 (×3): qty 2
  Filled 2023-02-09: qty 4
  Filled 2023-02-09 (×2): qty 2
  Filled 2023-02-09 (×5): qty 4
  Filled 2023-02-09: qty 2
  Filled 2023-02-09 (×2): qty 4

## 2023-02-09 MED ORDER — SEVELAMER CARBONATE 800 MG PO TABS
2400.0000 mg | ORAL_TABLET | Freq: Three times a day (TID) | ORAL | Status: DC
  Administered 2023-02-09 – 2023-03-04 (×58): 2400 mg via ORAL
  Filled 2023-02-09 (×58): qty 3

## 2023-02-09 MED ORDER — VANCOMYCIN HCL IN DEXTROSE 1-5 GM/200ML-% IV SOLN
1000.0000 mg | Freq: Once | INTRAVENOUS | Status: DC
Start: 2023-02-09 — End: 2023-02-09

## 2023-02-09 MED ORDER — MORPHINE SULFATE (PF) 4 MG/ML IV SOLN
4.0000 mg | Freq: Once | INTRAVENOUS | Status: AC
  Administered 2023-02-09: 4 mg via INTRAVENOUS
  Filled 2023-02-09: qty 1

## 2023-02-09 MED ORDER — HEPARIN SODIUM (PORCINE) 5000 UNIT/ML IJ SOLN: 5000.0000 [IU] | Freq: Three times a day (TID) | INTRAMUSCULAR | Status: DC

## 2023-02-09 MED ORDER — PIPERACILLIN-TAZOBACTAM IN DEX 2-0.25 GM/50ML IV SOLN
2.2500 g | Freq: Once | INTRAVENOUS | Status: AC
  Administered 2023-02-09: 2.25 g via INTRAVENOUS
  Filled 2023-02-09: qty 50

## 2023-02-09 MED ORDER — ACETAMINOPHEN 650 MG RE SUPP: 650.0000 mg | Freq: Four times a day (QID) | RECTAL | Status: DC | PRN

## 2023-02-09 MED ORDER — HYDROCODONE-ACETAMINOPHEN 5-325 MG PO TABS
2.0000 | ORAL_TABLET | ORAL | Status: DC | PRN
  Administered 2023-02-09 – 2023-03-04 (×80): 2 via ORAL
  Filled 2023-02-09 (×85): qty 2

## 2023-02-09 MED ORDER — GADOBUTROL 1 MMOL/ML IV SOLN
10.0000 mL | Freq: Once | INTRAVENOUS | Status: AC | PRN
  Administered 2023-02-09: 10 mL via INTRAVENOUS

## 2023-02-09 MED ORDER — PIPERACILLIN-TAZOBACTAM IN DEX 2-0.25 GM/50ML IV SOLN
2.2500 g | Freq: Three times a day (TID) | INTRAVENOUS | Status: DC
  Administered 2023-02-09 – 2023-02-11 (×5): 2.25 g via INTRAVENOUS
  Filled 2023-02-09 (×7): qty 50

## 2023-02-09 MED ORDER — HYDROMORPHONE HCL 1 MG/ML IJ SOLN
0.5000 mg | INTRAMUSCULAR | Status: DC | PRN
Start: 2023-02-09 — End: 2023-02-11
  Administered 2023-02-09 – 2023-02-11 (×10): 1 mg via INTRAVENOUS
  Filled 2023-02-09 (×8): qty 1

## 2023-02-09 MED ORDER — DOCUSATE SODIUM 100 MG PO CAPS
100.0000 mg | ORAL_CAPSULE | Freq: Two times a day (BID) | ORAL | Status: DC
  Administered 2023-02-09 – 2023-02-17 (×13): 100 mg via ORAL
  Filled 2023-02-09 (×15): qty 1

## 2023-02-09 NOTE — Consult Note (Addendum)
Pharmacy Antibiotic Note  Adam Howell is a 62 y.o. male admitted on 02/09/2023 with left leg cellulitis. PMH is significant for ESRD on HD (MWF), HTN, HLD, and T2DM. Per patient, he has been dealing with a chronic wound on his left shin for a while, but the wound has recently gotten larger with significant drainage. He notes that it has become tender to touch over the last few days and started leaking pus. Pharmacy has been consulted for vancomycin and Zosyn dosing.  Today, 02/09/2023 Day 1 of Vancomycin/Zosyn  WBC 7.1 Afebrile  Scr 10.09 with estimated CrCl 10.6 mL/min  Received vancomycin 1000 mg IV x 1 and Zosyn 2.25 mg IV x 1 in ED Blood cultures sent   Plan: Give an additional vancomycin 1500 mg IV dose x 1 to complete a full loading dose of 2500 mg  Start vancomycin 1000 mg IV with each dialysis session  Per nephrology patient will be dialyzed tomorrow 11/27   Start Zosyn 2.25 gm IV Q8H  Pharmacy will continue to monitor dialysis schedule to ensure patient is being dosed appropriately   Height: 6\' 3"  (190.5 cm) Weight: 117.9 kg (260 lb) IBW/kg (Calculated) : 84.5  Temp (24hrs), Avg:98.6 F (37 C), Min:98.6 F (37 C), Max:98.6 F (37 C)  Recent Labs  Lab 02/09/23 0944  WBC 7.1  CREATININE 10.09*  LATICACIDVEN 1.4    Estimated Creatinine Clearance: 10.6 mL/min (A) (by C-G formula based on SCr of 10.09 mg/dL (H)).    Allergies  Allergen Reactions   Tramadol Rash   Antimicrobials this admission: Zosyn 11/26 >>   Vancomycin 11/26 >>   Dose adjustments this admission:  Microbiology results: 11/26 BCx: sent  Thank you for allowing pharmacy to be a part of this patient's care.  Littie Deeds, PharmD Pharmacy Resident  02/09/2023 12:24 PM

## 2023-02-09 NOTE — Progress Notes (Signed)
Consulted for dialysis as patient needs a MRI of foot. Instructed primary team to get MRI today and will dialyze patient tomorrow. Will provide formal consult tomorrow.

## 2023-02-09 NOTE — ED Notes (Signed)
About 5 inch wound to L shin

## 2023-02-09 NOTE — ED Notes (Addendum)
Called lab at this time to obtain blood culture and lactic acid. Dr. Larinda Buttery, EDP made aware of delay in abx start

## 2023-02-09 NOTE — ED Notes (Signed)
One set of blood cultures obtained. Phlebotomist was unable to get the second set of cultures.

## 2023-02-09 NOTE — ED Provider Notes (Signed)
[facility] Provider Note    Event Date/Time   First MD Initiated Contact with Patient 02/09/23 [phone]     (approximate)   History   Chief Complaint Leg Pain   HPI  Adam Howell is a 62 y.o. male with past medical history of hypertension, hyperlipidemia, diabetes, and ESRD on HD (MWF) who presents to the ED complaining of leg pain.  Patient reports that he has been dealing with a chronic wound on his left shin for a while, but the wound has recently gotten larger with significant drainage.  He states it has been more tender to touch over the past few days with leaking pus.  He denies any fevers, nausea, vomiting, chest pain, or abdominal pain.  He missed his dialysis appointment yesterday due to transportation issues, last received dialysis 4 days ago.     Physical Exam   Triage Vital Signs: ED Triage Vitals  Encounter Vitals Group     BP 02/09/23 0917 134/80     Systolic BP Percentile --      Diastolic BP Percentile --      Pulse Rate 02/09/23 0917 83     Resp 02/09/23 0917 19     Temp 02/09/23 0917 98.6 F (37 C)     Temp src --      SpO2 02/09/23 0917 100 %     Weight 02/09/23 0916 260 lb (117.9 kg)     Height 02/09/23 0916 6\' 3"  (1.905 m)     Head Circumference --      Peak Flow --      Pain Score 02/09/23 0915 10     Pain Loc --      Pain Education --      Exclude from Growth Chart --     Most recent vital signs: Vitals:   02/09/23 0917  BP: 134/80  Pulse: 83  Resp: 19  Temp: 98.6 F (37 C)  SpO2: 100%    Constitutional: Alert and oriented. Eyes: Conjunctivae are normal. Head: Atraumatic. Nose: No congestion/rhinnorhea. Mouth/Throat: Mucous membranes are moist.  Cardiovascular: Normal rate, regular rhythm. Grossly normal heart sounds.  2+ radial pulses bilaterally, 1+ DP pulse on left.  Left forearm AV fistula with palpable thrill. Respiratory: Normal respiratory effort.  No retractions. Lungs CTAB. Gastrointestinal:  Soft and nontender. No distention. Musculoskeletal: Large wound to left anterior lower leg with purulent drainage and surrounding warmth with tenderness.  Status post right AKA. Neurologic:  Normal speech and language. No gross focal neurologic deficits are appreciated.    ED Results / Procedures / Treatments   Labs (all labs ordered are listed, but only abnormal results are displayed) Labs Reviewed  BASIC METABOLIC PANEL - Abnormal; Notable for the following components:      Result Value   Sodium 134 (*)    Chloride 92 (*)    Glucose, Bld 127 (*)    BUN 47 (*)    Creatinine, Ser 10.09 (*)    Calcium 8.6 (*)    GFR, Estimated 5 (*)    All other components within normal limits  HEPATIC FUNCTION PANEL - Abnormal; Notable for the following components:   Albumin 3.3 (*)    AST 13 (*)    All other components within normal limits  CULTURE, BLOOD (ROUTINE X 2)  CULTURE, BLOOD (ROUTINE X 2)  CBC  LACTIC ACID, PLASMA   RADIOLOGY Left tib-fib x-ray reviewed and interpreted by me with no signs of osteomyelitis or fracture.  PROCEDURES:  Critical Care performed: No  Procedures   MEDICATIONS ORDERED IN ED: Medications  piperacillin-tazobactam (ZOSYN) IVPB 2.25 g (2.25 g Intravenous New Bag/Given 02/09/23 1138)  vancomycin (VANCOCIN) IVPB 1000 mg/200 mL premix (has no administration in time range)  HYDROmorphone (DILAUDID) injection 0.5 mg (has no administration in time range)  morphine (PF) 4 MG/ML injection 4 mg (4 mg Intravenous Given 02/09/23 1048)     IMPRESSION / MDM / ASSESSMENT AND PLAN / ED COURSE  I reviewed the triage vital signs and the nursing notes.                              62 y.o. male with past medical history of hypertension, hyperlipidemia, diabetes, and ESRD on HD (MWF) who presents to the ED complaining of large wound to left anterior lower leg with purulent drainage.  Patient's presentation is most consistent with acute presentation with potential  threat to life or bodily function.  Differential diagnosis includes, but is not limited to, sepsis, cellulitis, osteomyelitis, electrolyte abnormality, anemia.  Patient chronically ill but nontoxic-appearing and in no acute distress, vital signs are unremarkable.  He has a large wound to his left lower leg that appears infected and I am concerned for osteomyelitis as well.  We will further assess with x-ray, treat with IV Zosyn and vancomycin for now.  Labs are reassuring with no significant anemia, leukocytosis, or electrolyte abnormality.  Lactic acid also within normal limits and low suspicion for sepsis at this time.  We will treat pain with IV morphine and reassess.  No signs of osteomyelitis noted on x-ray, patient continues to have significant pain and we will treat with IV Dilaudid.  Case discussed with hospitalist for admission.      FINAL CLINICAL IMPRESSION(S) / ED DIAGNOSES   Final diagnoses:  Left leg cellulitis  Wound infection     Rx / DC Orders   ED Discharge Orders     None        Note:  This document was prepared using Dragon voice recognition software and may include unintentional dictation errors.   Chesley Noon, MD 02/09/23 [phone]

## 2023-02-09 NOTE — ED Notes (Signed)
Patient transported to MRI

## 2023-02-09 NOTE — H&P (Signed)
HISTORY AND PHYSICAL    Adam Howell   ZOX:[id number] DOB: 08-13-1960   Date of Service: 02/09/23 Requesting physician/APP from ED: Treatment Team:  Attending Provider: Chesley Noon, MD  PCP: Marjie Skiff, NP   HPI: Adam Howell is a 62 y.o. male w/ PMH ESRD on HD MWF, anemia chronic kidney disease, type 2 diabetes, chronic pain syndrome, chronic HFrEF, a/p R AKA, HLD, HTN, CAD.  Presents to the emergency department from home with C/L left leg pain and swelling x 3 to 4 days, foul odor to wound and leaking fluid for past few days.  Has been seeing wound care for this, recently treated outpatient by his PCP with doxycycline but wound has gotten worse.  See photographs below.  Patient denies fever, chills, nausea, vomiting    Consultants:  Nephrology   Procedures: none      ASSESSMENT & PLAN:   Infection skin ulcer anterior LLE XR no concerns Given high risk will get MRI to evaluate for osteomyelitis, discussed w/ nephrology team and was advised that MRI can be done today and dialysis tomorrow  Continue vancomycin and zosyn  Pain control  Possible surgical consult pending MRI results   ESRD on HD MWF Nephrology to follow and maintain HD   Atrial fibrillation, chronic  Continue home Eliquis, amiodarone  CAD HLD Chronic HFrEF not in acute exacerbation  Not on ASA d/t on Eliquis Continue home rosuvastatin Not on beta blocker, ACE/ARB w/ low BP  Chronic pain syndrome On pregabalin 25 mg daily in the mornings, continue here  On hydrocodone-APAP at home 10-325, continue here Add hydromorphone IV as needed, wean as able   Diet controlled DM2 Recent A1C 01/12/23 was 5.9 Monitor Glc w/ AM labs  SSI if needed   Chronic constipation likely d/t chronic opiate use  Continue home docusate bid Add senna at bedtime while on higher dose opiate pain medications  Milk of magnesia prn       DVT prophylaxis: continue home Eliquis (Afib) Pertinent  IV fluids/nutrition: no continuous IV fluids, regular diet  Central lines / invasive devices: none  Code Status: FULL CODE Family Communication: pt declines call to family at this time  Disposition: inpatient TOC needs: TBD Barriers to discharge / significant pending items: MRI, IV abx                   Review of Systems:  Review of Systems  Constitutional:  Negative for chills, fever, malaise/fatigue and weight loss.  Respiratory:  Negative for cough, shortness of breath and wheezing.   Cardiovascular:  Negative for chest pain, palpitations, orthopnea, claudication and leg swelling.  Gastrointestinal:  Negative for abdominal pain, diarrhea, heartburn, nausea and vomiting.  Skin:  Positive for rash (wound- see below).  Neurological:  Negative for dizziness, focal weakness and loss of consciousness.  Psychiatric/Behavioral:  Negative for depression.            has a past medical history of Chronic kidney disease, Congestive heart failure (HCC), Diabetes mellitus without complication (HCC), Difficult intubation, Hyperlipidemia, and Hypertension..  No current facility-administered medications on file prior to encounter.   Current Outpatient Medications on File Prior to Encounter  Medication Sig Dispense Refill   amiodarone (PACERONE) 200 MG tablet Take 1 tablet (200 mg total) by mouth 2 (two) times daily. 180 tablet 4   calcitRIOL (ROCALTROL) 0.5 MCG capsule Take 3 capsules (1.5 mcg total) by mouth every Monday, Wednesday, and Friday at 6 PM.  cinacalcet (SENSIPAR) 30 MG tablet Take 1 tablet (30 mg total) by mouth daily with breakfast. 30 tablet 0   Disposable Gloves (NITRILE EXAM GLOVES LARGE) MISC To use as needed for bathing patient. 100 each 12   ELIQUIS 2.5 MG TABS tablet Take 1 tablet (2.5 mg total) by mouth 2 (two) times daily. 180 tablet 4   hydrocerin (EUCERIN) CREA Apply 1 Application topically 2 (two) times daily. To foot 454 g 0    HYDROcodone-acetaminophen (NORCO) 10-325 MG tablet Take 1 tablet by mouth every 8 (eight) hours as needed for up to 7 days. 21 tablet 0   leptospermum manuka honey (MEDIHONEY) PSTE paste Apply 1 Application topically daily. To wound on lower left leg. 15 mL 3   melatonin 5 MG TABS Take 2 tablets (10 mg total) by mouth at bedtime as needed. 60 tablet 0   multivitamin (RENA-VIT) TABS tablet Take 1 tablet by mouth at bedtime. 30 tablet 0   pregabalin (LYRICA) 25 MG capsule Take 1 capsule (25 mg total) by mouth daily. 30 capsule 2   rosuvastatin (CRESTOR) 40 MG tablet Take 1 tablet (40 mg total) by mouth daily. 90 tablet 4   senna (SENOKOT) 8.6 MG TABS tablet Take 2 tablets (17.2 mg total) by mouth at bedtime. 60 tablet 0   sevelamer carbonate (RENVELA) 800 MG tablet Take 2,400 mg by mouth 3 (three) times daily.     traZODone (DESYREL) 100 MG tablet Take 1 tablet (100 mg total) by mouth at bedtime. 90 tablet 4     Allergies  Allergen Reactions   Tramadol Rash   family history includes Cirrhosis in his father; Heart failure in his mother; Hypertension in his daughter.  Past Surgical History:  Procedure Laterality Date   A/V FISTULAGRAM Left 08/27/2022   Procedure: A/V Fistulagram;  Surgeon: Annice Needy, MD;  Location: [facility];  Service: Cardiovascular;  Laterality: Left;   AMPUTATION Right 03/13/2022   Procedure: AMPUTATION ABOVE KNEE;  Surgeon: Annice Needy, MD;  Location: [facility] ORS;  Service: General;  Laterality: Right;   APPLICATION OF WOUND VAC Right 03/07/2022   Procedure: APPLICATION OF WOUND VAC;  Surgeon: Bertram Denver, MD;  Location: [facility] ORS;  Service: Vascular;  Laterality: Right;  GAAC [id number]   WOUND DEBRIDEMENT Left    WOUND DEBRIDEMENT Right 01/16/2022   Procedure: DEBRIDEMENT WOUND;  Surgeon: Renford Dills, MD;  Location: [facility] ORS;  Service: Vascular;  Laterality: Right;  Wound vac placement   WOUND DEBRIDEMENT Right 03/07/2022   Procedure: DEBRIDEMENT WOUND  RIGHT LOWER EXTREMITY;  Surgeon: Bertram Denver, MD;  Location: [facility] OR[facility];  Service: Vascular;  Laterality: Right;          Objective Findings:  Vitals:   02/09/23 0916 02/09/23 0917  BP:  134/80  Pulse:  83  Resp:  19  Temp:  98.6 F (37 C)  SpO2:  100%  Weight: 117.9 kg   Height: 6\' 3"  (1.905 m)    No intake or output data in the 24 hours ending 02/09/23 1203 Filed Weights   02/09/23 0916  Weight: 117.9 kg    Examination:  Physical Exam Constitutional:      General: He is not in acute distress. Cardiovascular:     Rate and Rhythm: Normal rate and regular rhythm.  Pulmonary:     Effort: Pulmonary effort is normal.     Breath sounds: Normal breath sounds.  Abdominal:     Palpations: Abdomen is soft.  Musculoskeletal:     Left lower leg: Edema (trace) present.     Comments: R AKA  Skin:    General: Skin is warm.     Findings: Lesion (see photos) present.  Neurological:     General: No focal deficit present.     Mental Status: [name] and oriented to person, place, and time.  Psychiatric:        Mood and Affect: Mood normal.        Behavior: Behavior normal.   12/31/22   Today 02/09/23         Scheduled Medications:    HYDROmorphone (DILAUDID) injection  0.5 mg Intravenous Once    Continuous Infusions:  piperacillin-tazobactam 2.25 g (02/09/23 1138)   vancomycin      PRN Medications:    Antimicrobials:  Anti-infectives (From admission, onward)    Start     Dose/Rate Route Frequency Ordered Stop   02/09/23 1045  piperacillin-tazobactam (ZOSYN) IVPB 2.25 g        2.25 g 100 mL/hr over 30 Minutes Intravenous  Once 02/09/23 1019     02/09/23 1030  vancomycin (VANCOCIN) IVPB 1000 mg/200 mL premix        1,000 mg 200 mL/hr over 60 Minutes Intravenous  Once 02/09/23 1019             Data Reviewed: I have personally reviewed following labs and imaging studies  CBC: Recent Labs  Lab 02/09/23 0944  WBC 7.1  HGB 14.8  HCT  44.1  MCV 84.8  PLT 250   Basic Metabolic Panel: Recent Labs  Lab 02/09/23 0944  NA 134*  K 3.9  CL 92*  CO2 29  GLUCOSE 127*  BUN 47*  CREATININE 10.09*  CALCIUM 8.6*   GFR: Estimated Creatinine Clearance: 10.6 mL/min (A) (by C-G formula based on SCr of 10.09 mg/dL (H)). Liver Function Tests: Recent Labs  Lab 02/09/23 0918  AST 13*  ALT 8  ALKPHOS 67  BILITOT 0.4  PROT 7.8  ALBUMIN 3.3*   No results for input(s): "LIPASE", "AMYLASE" in the last 168 hours. No results for input(s): "AMMONIA" in the last 168 hours. Coagulation Profile: No results for input(s): "INR", "PROTIME" in the last 168 hours. Cardiac Enzymes: No results for input(s): "CKTOTAL", "CKMB", "CKMBINDEX", "TROPONINI" in the last 168 hours. BNP (last 3 results) No results for input(s): "PROBNP" in the last 8760 hours. HbA1C: No results for input(s): "HGBA1C" in the last 72 hours. CBG: No results for input(s): "GLUCAP" in the last 168 hours. Lipid Profile: No results for input(s): "CHOL", "HDL", "LDLCALC", "TRIG", "CHOLHDL", "LDLDIRECT" in the last 72 hours. Thyroid Function Tests: No results for input(s): "TSH", "T4TOTAL", "FREET4", "T3FREE", "THYROIDAB" in the last 72 hours. Anemia Panel: No results for input(s): "VITAMINB12", "FOLATE", "FERRITIN", "TIBC", "IRON", "RETICCTPCT" in the last 72 hours. Most Recent Urinalysis On File:  No results found for: "COLORURINE", "APPEARANCEUR", "LABSPEC", "PHURINE", "GLUCOSEU", "HGBUR", "BILIRUBINUR", "KETONESUR", "PROTEINUR", "UROBILINOGEN", "NITRITE", "LEUKOCYTESUR" Sepsis Labs: @LABRCNTIP (procalcitonin:4,lacticidven:4)  No results found for this or any previous visit (from the past 240 hour(s)).       Radiology Studies: DG Tibia/Fibula Left  Result Date: 02/09/2023 CLINICAL DATA:  Left leg pain and swelling for several days. Diabetic wound is noted. EXAM: LEFT TIBIA AND FIBULA - 2 VIEW COMPARISON:  None Available. FINDINGS: There is no evidence of  fracture or other focal bone lesions. Large soft tissue ulceration is seen anterior to the distal tibia. No underlying lytic destruction is noted. IMPRESSION: No  definite evidence of fracture or osteomyelitis. Electronically Signed   By: Lupita Raider M.D.   On: 02/09/2023 11:31             LOS: 0 days      Sunnie Nielsen, DO [facility] 02/09/2023, 12:03 PM    Dictation software may have been used to generate the above note. Typos may occur and escape review in typed/dictated notes. Please contact Dr Lyn Hollingshead directly for clarity if needed.  Staff may message me via secure chat in Epic  but this may not receive an immediate response,  please page me for urgent matters!  If 7PM-7AM, please contact night coverage [email or web address]

## 2023-02-09 NOTE — ED Triage Notes (Signed)
Pt comes with c/o left leg pain and swelling for about 3-4 days. Pt states he has wound that has foul odor and it is leaking fluid. Pt is diabetic. Pt has bandage in place covering area.

## 2023-02-10 DIAGNOSIS — E1122 Type 2 diabetes mellitus with diabetic chronic kidney disease: Secondary | ICD-10-CM

## 2023-02-10 DIAGNOSIS — T148XXA Other injury of unspecified body region, initial encounter: Secondary | ICD-10-CM | POA: Diagnosis not present

## 2023-02-10 DIAGNOSIS — E1169 Type 2 diabetes mellitus with other specified complication: Secondary | ICD-10-CM

## 2023-02-10 DIAGNOSIS — N186 End stage renal disease: Secondary | ICD-10-CM

## 2023-02-10 DIAGNOSIS — G894 Chronic pain syndrome: Secondary | ICD-10-CM

## 2023-02-10 DIAGNOSIS — I482 Chronic atrial fibrillation, unspecified: Secondary | ICD-10-CM

## 2023-02-10 DIAGNOSIS — Z89611 Acquired absence of right leg above knee: Secondary | ICD-10-CM

## 2023-02-10 DIAGNOSIS — L97921 Non-pressure chronic ulcer of unspecified part of left lower leg limited to breakdown of skin: Secondary | ICD-10-CM | POA: Diagnosis not present

## 2023-02-10 DIAGNOSIS — L089 Local infection of the skin and subcutaneous tissue, unspecified: Secondary | ICD-10-CM | POA: Diagnosis not present

## 2023-02-10 DIAGNOSIS — E785 Hyperlipidemia, unspecified: Secondary | ICD-10-CM

## 2023-02-10 DIAGNOSIS — I132 Hypertensive heart and chronic kidney disease with heart failure and with stage 5 chronic kidney disease, or end stage renal disease: Secondary | ICD-10-CM

## 2023-02-10 DIAGNOSIS — Z992 Dependence on renal dialysis: Secondary | ICD-10-CM

## 2023-02-10 DIAGNOSIS — D631 Anemia in chronic kidney disease: Secondary | ICD-10-CM

## 2023-02-10 LAB — CBC
HCT: 38.8 % — ABNORMAL LOW (ref 39.0–52.0)
Hemoglobin: 13 g/dL (ref 13.0–17.0)
MCH: 28.9 pg (ref 26.0–34.0)
MCHC: 33.5 g/dL (ref 30.0–36.0)
MCV: 86.2 fL (ref 80.0–100.0)
Platelets: 222 10*3/uL (ref 150–400)
RBC: 4.5 MIL/uL (ref 4.22–5.81)
RDW: 15.3 % (ref 11.5–15.5)
WBC: 6.3 10*3/uL (ref 4.0–10.5)
nRBC: 0 % (ref 0.0–0.2)

## 2023-02-10 LAB — BASIC METABOLIC PANEL
Anion gap: 15 (ref 5–15)
BUN: 56 mg/dL — ABNORMAL HIGH (ref 8–23)
CO2: 27 mmol/L (ref 22–32)
Calcium: 8.3 mg/dL — ABNORMAL LOW (ref 8.9–10.3)
Chloride: 90 mmol/L — ABNORMAL LOW (ref 98–111)
Creatinine, Ser: 11.64 mg/dL — ABNORMAL HIGH (ref 0.61–1.24)
GFR, Estimated: 5 mL/min — ABNORMAL LOW (ref >60)
Glucose, Bld: 84 mg/dL (ref 70–99)
Potassium: 4.2 mmol/L (ref 3.5–5.1)
Sodium: 132 mmol/L — ABNORMAL LOW (ref 135–145)

## 2023-02-10 LAB — PHOSPHORUS: Phosphorus: 6.1 mg/dL — ABNORMAL HIGH (ref 2.5–4.6)

## 2023-02-10 MED ORDER — HEPARIN SODIUM (PORCINE) 1000 UNIT/ML DIALYSIS: 1000.0000 [IU] | INTRAMUSCULAR | Status: DC | PRN

## 2023-02-10 MED ORDER — LIDOCAINE-PRILOCAINE 2.5-2.5 % EX CREA: 1.0000 | TOPICAL_CREAM | CUTANEOUS | Status: DC | PRN

## 2023-02-10 MED ORDER — PENTAFLUOROPROP-TETRAFLUOROETH EX AERO: 1.0000 | INHALATION_SPRAY | CUTANEOUS | Status: DC | PRN

## 2023-02-10 MED ORDER — HYDROMORPHONE HCL 1 MG/ML IJ SOLN
INTRAMUSCULAR | Status: AC
  Filled 2023-02-10: qty 1

## 2023-02-10 NOTE — Discharge Planning (Signed)
ESTABLISHED HEMODIALYSIS:  Outpatient facility:  [facility]  [address] [place], [place] [address] [phone]   Schedule: MWF 6:50am arrival for 7:10am chair time  Confirmed patient is active, patient next treatment is Friday 11/29.  Dimas Chyle Dialysis Coordinator II  [facility] Cell: [phone] eFax: [phone] [email or web address]

## 2023-02-10 NOTE — Progress Notes (Signed)
Received patient in bed to unit.    Informed consent signed and in chart.    TX duration: 2.5 hrs     Transported back to floor  Hand-off given to patient's nurse.   Access used:  lt avf Access issues: venous pressures were too high and tx ended pt declined to be recanulated  Total UF removed: 1500 mls Medication(s) given: Dilauid 1mg  iv x 2       Maple Hudson, RN Dialysis Unit

## 2023-02-10 NOTE — Progress Notes (Signed)
Progress Note   Patient: Adam Howell:[id number] DOB: 12-14-1960 DOA: 02/09/2023     1 DOS: the patient was seen and examined on 02/10/2023   Brief hospital course: 62 y.o. male male w/ PMH ESRD on HD MWF, anemia chronic kidney disease, type 2 diabetes, chronic pain syndrome, chronic HFrEF, a/p R AKA, HLD, HTN, CAD.  Presents to the emergency department from home with C/L left leg pain and swelling x 3 to 4 days, foul odor to wound and leaking fluid for past few days.  Has been seeing wound care for this, recently treated outpatient by his PCP with doxycycline but wound has gotten worse.   Assessment and Plan: Ulcer left lower extremity with infection. Patient does have draining, worsening pain. MRI left lower extremity negative for osteomyelitis. Continue vancomycin and Zosyn. Continue pain control. Vascular surgery team consulted for further management.  ESRD on hemodialysis Schedule for Monday, Wednesday and Friday. Nephrology on board for dialysis needs.  Chronic atrial fibrillation: Continue amiodarone, Eliquis therapy.  Chronic diastolic CHF No exacerbation. GDMT limited due to low blood pressures.  Chronic pain syndrome: Continue home dose opiates, Lyrica therapy. IV Dilaudid as needed will be weaned slowly.  Type 2 diabetes mellitus: A1c 5.9. Continue sliding scale insulin for hyperglycemia.  Chronic constipation: Due to chronic opiate use. Will give constipation regimen.  Obesity with BMI 32.5: Diet, exercise and weight reduction advised.        Out of bed to chair. Incentive spirometry. Nursing supportive care. Fall, aspiration precautions. DVT prophylaxis   Code Status: Full Code  Subjective: Patient is seen and examined today morning. He is lying in bed. Denies any complaints. Discussed about vascular team follow up and need for wound care compliance.  Physical Exam: Vitals:   02/10/23 1508 02/10/23 1530 02/10/23 1600 02/10/23 1630   BP: 127/81 121/86 106/68 114/71  Pulse: (!) 54 73 70 71  Resp: (!) 23 19 12 20   Temp:      TempSrc:      SpO2: 92%  92%   Weight:      Height:        General - Elderly obese African American male, no apparent distress HEENT - PERRLA, EOMI, atraumatic head, non tender sinuses. Lung - Clear, diffuse rales, rhonchi, no wheezes. Heart - S1, S2 heard, no murmurs, rubs, 1+ pedal edema. Abdomen - Soft, non tender obese, bowel sounds good Neuro - Alert, awake and oriented x 3, non focal exam. Skin - Warm and dry. Left arm fistula, Left leg dressing noted.  Data Reviewed:      Latest Ref Rng & Units 02/10/2023    5:18 AM 02/09/2023    9:44 AM 01/12/2023    2:14 PM  CBC  WBC 4.0 - 10.5 K/uL 6.3  7.1  5.5   Hemoglobin 13.0 - 17.0 g/dL 44.0  10.2  72.5   Hematocrit 39.0 - 52.0 % 38.8  44.1  51.0   Platelets 150 - 400 K/uL 222  250  257       Latest Ref Rng & Units 02/10/2023    5:18 AM 02/09/2023    9:44 AM 12/24/2022   11:07 AM  BMP  Glucose 70 - 99 mg/dL 84  366  89   BUN 8 - 23 mg/dL 56  47  27   Creatinine 0.61 - 1.24 mg/dL 44.03  47.42  5.95   Sodium 135 - 145 mmol/L 132  134  134   Potassium 3.5 - 5.1 mmol/L  4.2  3.9  5.0   Chloride 98 - 111 mmol/L 90  92  89   CO2 22 - 32 mmol/L 27  29  29    Calcium 8.9 - 10.3 mg/dL 8.3  8.6  8.3    MR TIBIA FIBULA LEFT W WO CONTRAST  Result Date: 02/09/2023 CLINICAL DATA:  Soft tissue infection suspected, lower leg. Concern for osteomyelitis. Chronic wound on left shin, recently larger with significant drainage. EXAM: MRI OF LOWER LEFT EXTREMITY WITHOUT AND WITH CONTRAST TECHNIQUE: Multiplanar, multisequence MR imaging of the right tibia and fibula was performed both before and after administration of intravenous contrast. CONTRAST:  10mL GADAVIST GADOBUTROL 1 MMOL/ML IV SOLN COMPARISON:  None available FINDINGS: Bones/Joint/Cartilage The distal anterior shin wound described below contacts the anteromedial cortex of the distal third of  the tibial diaphysis (axial series 13, images 49-54). No marrow edema is seen in this region, however may be minimal periosteal thickening/irregularity of the peripheral aspect of this anterior tibia, corresponding to minimal lucency within the superficial cortex at the superior aspect of the wound on today's 02/09/2023 radiographs. Ligaments No ligament tear is seen. Muscles and Tendons There is moderate edema within the edema within the medial head of the gastrocnemius muscle there is edema within the peripheral predominantly anterior aspect of the distal tibialis anterior tendon at the musculotendinous junction adjacent to the distal anterior shin wound (axial series 13, images 39 through 58). Soft tissues There is again a deep wound within the distal anterior shin along an approximate 7 cm craniocaudal length. This contacts the anteromedial aspect of the tibial cortex (axial series 13 images 49 through 54). No rim enhancing abscess is seen. IMPRESSION: 1. Chronic deep wound within the distal anterior shin along an approximate 7 cm craniocaudal length. This contacts the anteromedial cortex of the distal third of the tibial diaphysis. No marrow edema is seen in this region, however there may be minimal periosteal thickening/irregularity of the peripheral aspect of this anterior tibia, corresponding to minimal lucency within the superficial cortex at the superior aspect of the wound on today's 02/09/2023 radiographs. This may represent periosteal reaction from age indeterminate minimal infection within the peripheral periosteum but not within the tibial marrow. 2. There is edema within the medial head of the gastrocnemius muscle and within the peripheral predominantly anterior aspect of the distal tibialis anterior tendon at the musculotendinous junction adjacent to the distal anterior shin wound. 3. No rim enhancing abscess is seen. Electronically Signed   By: Neita Garnet M.D.   On: 02/09/2023 19:40   DG  Tibia/Fibula Left  Result Date: 02/09/2023 CLINICAL DATA:  Left leg pain and swelling for several days. Diabetic wound is noted. EXAM: LEFT TIBIA AND FIBULA - 2 VIEW COMPARISON:  None Available. FINDINGS: There is no evidence of fracture or other focal bone lesions. Large soft tissue ulceration is seen anterior to the distal tibia. No underlying lytic destruction is noted. IMPRESSION: No definite evidence of fracture or osteomyelitis. Electronically Signed   By: Lupita Raider M.D.   On: 02/09/2023 11:31     Family Communication: Discussed with patient, he understand and agree. All questions answereed.  Disposition: Status is: Inpatient Remains inpatient appropriate because: wound infection, IV antibiotics. Vascular team follow up.  Planned Discharge Destination: Home with Home Health     Time spent: 38 minutes  Author: [name], MD 02/10/2023 5:32 PM Secure chat 7am to 7pm For on call review [email or web address].

## 2023-02-10 NOTE — Consult Note (Signed)
Hospital Consult    Reason for Consult:  Left Leg Pain with non healing wound Requesting Physician:  Dr Sunnie Nielsen MD  MRN #:  [id number]  History of Present Illness: This is a 62 y.o. male male w/ PMH ESRD on HD MWF, anemia chronic kidney disease, type 2 diabetes, chronic pain syndrome, chronic HFrEF, a/p R AKA, HLD, HTN, CAD.  Presents to the emergency department from home with C/L left leg pain and swelling x 3 to 4 days, foul odor to wound and leaking fluid for past few days.  Has been seeing wound care for this, recently treated outpatient by his PCP with doxycycline but wound has gotten worse.   On exam this morning the patient resting comfortably in bed.  Patient noted to have anterior tibial ulcer/wound that is open.  Positive serosanguineous drainage.  See pictures in the media file.  Patient currently is receiving antibiotics.  Patient currently endorses that he wishes to save his left leg at all cost and prefers to stay in the hospital until taking care of even if it means through the holiday.  No other complaints.  Vitals are remained stable.  Vascular surgery consulted to evaluate.  Past Medical History:  Diagnosis Date   Chronic kidney disease    Congestive heart failure (HCC)    Diabetes mellitus without complication (HCC)    Difficult intubation    Hyperlipidemia    Hypertension     Past Surgical History:  Procedure Laterality Date   A/V FISTULAGRAM Left 08/27/2022   Procedure: A/V Fistulagram;  Surgeon: Annice Needy, MD;  Location: [facility];  Service: Cardiovascular;  Laterality: Left;   AMPUTATION Right 03/13/2022   Procedure: AMPUTATION ABOVE KNEE;  Surgeon: Annice Needy, MD;  Location: [facility] OR[facility];  Service: General;  Laterality: Right;   APPLICATION OF WOUND VAC Right 03/07/2022   Procedure: APPLICATION OF WOUND VAC;  Surgeon: Bertram Denver, MD;  Location: [facility] OR[facility];  Service: Vascular;  Laterality: Right;  GAAC [id number]   WOUND DEBRIDEMENT Left     WOUND DEBRIDEMENT Right 01/16/2022   Procedure: DEBRIDEMENT WOUND;  Surgeon: Renford Dills, MD;  Location: [facility] OR[facility];  Service: Vascular;  Laterality: Right;  Wound vac placement   WOUND DEBRIDEMENT Right 03/07/2022   Procedure: DEBRIDEMENT WOUND RIGHT LOWER EXTREMITY;  Surgeon: Bertram Denver, MD;  Location: [facility] OR[facility];  Service: Vascular;  Laterality: Right;    Allergies  Allergen Reactions   Tramadol Rash    Prior to Admission medications   Medication Sig Start Date End Date Taking? Authorizing Provider  amiodarone (PACERONE) 200 MG tablet Take 1 tablet (200 mg total) by mouth 2 (two) times daily. 10/12/22   Aura Dials T, NP  calcitRIOL (ROCALTROL) 0.5 MCG capsule Take 3 capsules (1.5 mcg total) by mouth every Monday, Wednesday, and Friday at 6 PM. 04/03/22   Love, Evlyn Kanner, PA-C  cinacalcet (SENSIPAR) 30 MG tablet Take 1 tablet (30 mg total) by mouth daily with breakfast. 04/03/22   Love, Evlyn Kanner, PA-C  Disposable Gloves (NITRILE EXAM GLOVES LARGE) MISC To use as needed for bathing patient. 09/14/22   Cannady, Jolene T, NP  ELIQUIS 2.5 MG TABS tablet Take 1 tablet (2.5 mg total) by mouth 2 (two) times daily. 10/12/22   Cannady, Corrie Dandy T, NP  hydrocerin (EUCERIN) CREA Apply 1 Application topically 2 (two) times daily. To foot 04/03/22   Love, Evlyn Kanner, PA-C  leptospermum manuka honey (MEDIHONEY) PSTE paste Apply 1 Application topically daily.  To wound on lower left leg. 12/22/22   Cannady, Corrie Dandy T, NP  multivitamin (RENA-VIT) TABS tablet Take 1 tablet by mouth at bedtime. 04/03/22   Love, Evlyn Kanner, PA-C  pregabalin (LYRICA) 25 MG capsule Take 1 capsule (25 mg total) by mouth daily. 10/12/22   Cannady, Corrie Dandy T, NP  rosuvastatin (CRESTOR) 40 MG tablet Take 1 tablet (40 mg total) by mouth daily. 10/12/22   Cannady, Corrie Dandy T, NP  senna (SENOKOT) 8.6 MG TABS tablet Take 2 tablets (17.2 mg total) by mouth at bedtime. 04/03/22   Love, Evlyn Kanner, PA-C  sevelamer carbonate (RENVELA) 800 MG tablet Take  2,400 mg by mouth 3 (three) times daily. 09/21/22   [provider]  traZODone (DESYREL) 100 MG tablet Take 1 tablet (100 mg total) by mouth at bedtime. 01/12/23   Marjie Skiff, NP    Social History   Socioeconomic History   Marital status: Divorced    Spouse name: [name]   Number of children: 4   Years of education: Not on file   Highest education level: Not on file  Occupational History   Not on file  Tobacco Use   Smoking status: Never   Smokeless tobacco: Never  Vaping Use   Vaping status: Never Used  Substance and Sexual Activity   Alcohol use: Not Currently   Drug use: Never   Sexual activity: Not Currently  Other Topics Concern   Not on file  Social History Narrative   Lives with daughter [name].     Social Determinants of Health   Financial Resource Strain: Low Risk  (11/25/2021)   Overall Financial Resource Strain (CARDIA)    Difficulty of Paying Living Expenses: Not hard at all  Food Insecurity: No Food Insecurity (02/09/2023)   Hunger Vital Sign    Worried About Running Out of Food in the Last Year: Never true    Ran Out of Food in the Last Year: Never true  Transportation Needs: No Transportation Needs (02/09/2023)   PRAPARE - Administrator, Civil Service (Medical): No    Lack of Transportation (Non-Medical): No  Recent Concern: Transportation Needs - Unmet Transportation Needs (12/29/2022)   PRAPARE - Transportation    Lack of Transportation (Medical): Yes    Lack of Transportation (Non-Medical): Yes  Physical Activity: Insufficiently Active (11/25/2021)   Exercise Vital Sign    Days of Exercise per Week: 2 days    Minutes of Exercise per Session: 60 min  Stress: No Stress Concern Present (11/25/2021)   [employer] of Occupational Health - Occupational Stress Questionnaire    Feeling of Stress : Not at all  Social Connections: Moderately Isolated (12/29/2022)   Social Connection and Isolation Panel [NHANES]     Frequency of Communication with Friends and Family: More than three times a week    Frequency of Social Gatherings with Friends and Family: Three times a week    Attends Religious Services: 1 to 4 times per year    Active Member of Clubs or Organizations: No    Attends Banker Meetings: Never    Marital Status: Never married  Intimate Partner Violence: Not At Risk (02/09/2023)   Humiliation, Afraid, Rape, and Kick questionnaire    Fear of Current or Ex-Partner: No    Emotionally Abused: No    Physically Abused: No    Sexually Abused: No     Family History  Problem Relation Age of Onset   Heart failure Mother  Cirrhosis Father    Hypertension Daughter     ROS: Otherwise negative unless mentioned in HPI  Physical Examination  Vitals:   02/10/23 0407 02/10/23 0900  BP: 109/67 112/61  Pulse: 83 83  Resp: 17 16  Temp: 98.1 F (36.7 C) 98.4 F (36.9 C)  SpO2: 100% 95%   Body mass index is 32.5 kg/m.  General:  WDWN in NAD Gait: Not observed HENT: WNL, normocephalic Pulmonary: normal non-labored breathing, without Rales, rhonchi,  wheezing Cardiac: regular, without  Murmurs, rubs or gallops; without carotid bruits Abdomen: Positive bowel sounds throughout, soft, NT/ND, no masses Skin: Without rashes Vascular Exam/Pulses: Right prior AKA.  Left lower extremity warm to touch throughout.  Palpable DP pulse noted.  No edema.  Anterior tibial open ulcer/wound. Extremities: Without ischemic changes, without gangrene , without cellulitis; with open wounds;  Musculoskeletal: no muscle wasting or atrophy  Neurologic: A&O X 3;  No focal weakness or paresthesias are detected; speech is fluent/normal Psychiatric:  The pt has Normal affect. Lymph:  Unremarkable  CBC    Component Value Date/Time   WBC 6.3 02/10/2023 0518   RBC 4.50 02/10/2023 0518   HGB 13.0 02/10/2023 0518   HGB 16.8 01/12/2023 1414   HCT 38.8 (L) 02/10/2023 0518   HCT 51.0 01/12/2023 1414    PLT 222 02/10/2023 0518   PLT 257 01/12/2023 1414   MCV 86.2 02/10/2023 0518   MCV 88 01/12/2023 1414   MCH 28.9 02/10/2023 0518   MCHC 33.5 02/10/2023 0518   RDW 15.3 02/10/2023 0518   RDW 14.8 01/12/2023 1414   LYMPHSABS 1.0 01/12/2023 1414   MONOABS 0.5 12/24/2022 1107   EOSABS 0.2 01/12/2023 1414   BASOSABS 0.1 01/12/2023 1414    BMET    Component Value Date/Time   NA 132 (L) 02/10/2023 0518   NA 135 08/11/2022 1336   K 4.2 02/10/2023 0518   CL 90 (L) 02/10/2023 0518   CO2 27 02/10/2023 0518   GLUCOSE 84 02/10/2023 0518   BUN 56 (H) 02/10/2023 0518   BUN 96 (HH) 08/11/2022 1336   CREATININE 11.64 (H) 02/10/2023 0518   CALCIUM 8.3 (L) 02/10/2023 0518   GFRNONAA 5 (L) 02/10/2023 0518    COAGS: Lab Results  Component Value Date   INR 1.2 03/05/2022     Non-Invasive Vascular Imaging:   LOWER EXTREMITY DOPPLER STUDY   Patient Name:  Adam Howell  Date of Exam:   12/17/2022  Medical Rec #: [id number]            Accession #:    [id number]  Date of Birth: Oct 09, 1960            Patient Gender: M  Patient Age:   71 years  Exam Location:  [facility]  Procedure:      VAS [place] ABI WITH/WO TBI  Referring Phys: Levora Dredge    ---------------------------------------------------------------------------  -----    Indications: Ulceration, and peripheral artery disease.     Vascular Interventions: 03/13/2022: RT AKA.   Performing Technologist: Debbe Bales RVS     Examination Guidelines: A complete evaluation includes at minimum, Doppler  waveform signals and systolic blood pressure reading at the level of  bilateral  brachial, anterior tibial, and posterior tibial arteries, when vessel  segments  are accessible. Bilateral testing is considered an integral part of a  complete  examination. Photoelectric Plethysmograph (PPG) waveforms and toe systolic  pressure readings are included as required and additional duplex testing  as  needed.  Limited examinations for reoccurring indications may be performed  as  noted.     ABI Findings:  +--------+------------------+-----+--------+--------+  Right  Rt Pressure (mmHg)IndexWaveformComment   +--------+------------------+-----+--------+--------+  [id number]                                     +--------+------------------+-----+--------+--------+   +---------+------------------+-----+--------+-------+  Left    Lt Pressure (mmHg)IndexWaveformComment  +---------+------------------+-----+--------+-------+  Brachial                                HDA      +---------+------------------+-----+--------+-------+  PTA     125               0.99 biphasic         +---------+------------------+-----+--------+-------+  PERO    109               0.87 biphasic         +---------+------------------+-----+--------+-------+  Great Toe84                0.67 Normal           +---------+------------------+-----+--------+-------+   +-------+-----------+-----------+------------+------------+  ABI/TBIToday's ABIToday's TBIPrevious ABIPrevious TBI  +-------+-----------+-----------+------------+------------+  Right Rt AKA                                          +-------+-----------+-----------+------------+------------+  Left  .99        .67                                  +-------+-----------+-----------+------------+------------+         Summary:  Right:  RT AKA.    Left: Resting left ankle-brachial index is within normal range. The left  toe-brachial index is normal.   EXAM:02/09/2023 LEFT TIBIA AND FIBULA - 2 VIEW   COMPARISON:  None Available.   FINDINGS: There is no evidence of fracture or other focal bone lesions. Large soft tissue ulceration is seen anterior to the distal tibia. No underlying lytic destruction is noted.   IMPRESSION: No definite evidence of fracture or osteomyelitis.  Statin:  Yes.   Beta  Blocker:  No. Aspirin:  No. ACEI:  No. ARB:  No. CCB use:  No Other antiplatelets/anticoagulants:  Yes.   Eliquis 2.5 mg Twice Daily   ASSESSMENT/PLAN: This is a 62 y.o. male who presents to [facility] emergency department from home with complaint of left leg pain and swelling x 3 to 4 days, with foul odor to an open wound with leaking fluid.  Patient has been followed by wound care.  Patient's last hemoglobin A1c was 5.9.  Patient is currently hemodialysis patient.  Patient did see his PCP and received doxycycline but the wound has not gotten better since.  PLAN: Vascular surgery recommends left lower extremity ultrasounds with ABIs to compare to prior ABIs from 01/13/2023.  Patient's last ABI in his left lower extremity was 0.99 which is very good.  If they are the same patient will only need antibiotics and wound care at this time without intervention.  If patient's left lower extremity ultrasound with ABIs have worsened vascular surgery recommends left lower extremity angiogram with possible intervention.  Patient  is agreeable to this plan and is willing to stay in the hospital until angiogram completed if needed.  Therefore I will order the ultrasounds with ABIs and tentatively schedule the patient to be taken to the vascular lab on Monday, 02/15/2023 for left lower extremity angiogram with possible intervention.  I discussed in detail with the patient today the procedure, benefits, risk, and complications.  He verbalizes understanding.  He he would like to proceed while in hospital.  I answered all the patient's questions today.  Patient will be n.p.o. prior to procedure if needed pending results of left lower extremity ultrasound with ABIs.   -I discussed the plan in detail with Dr. Festus Barren MD and he agrees with the plan.   Marcie [facility] 02/10/2023 11:50 AM

## 2023-02-10 NOTE — Plan of Care (Signed)
Problem: Education: Goal: Knowledge of General Education information will improve Description: Including pain rating scale, medication(s)/side effects and non-pharmacologic comfort measures Outcome: Progressing   Problem: Health Behavior/Discharge Planning: Goal: Ability to manage health-related needs will improve Outcome: Progressing   Problem: Clinical Measurements: Goal: Ability to maintain clinical measurements within normal limits will improve Outcome: Progressing Goal: Will remain free from infection Outcome: Progressing Goal: Diagnostic test results will improve Outcome: Progressing Goal: Respiratory complications will improve Outcome: Progressing Goal: Cardiovascular complication will be avoided Outcome: Progressing   Problem: Nutrition: Goal: Adequate nutrition will be maintained Outcome: Progressing   Problem: Elimination: Goal: Will not experience complications related to bowel motility Outcome: Progressing Goal: Will not experience complications related to urinary retention Outcome: Progressing   Problem: Safety: Goal: Ability to remain free from injury will improve Outcome: Progressing   Problem: Skin Integrity: Goal: Risk for impaired skin integrity will decrease Outcome: Progressing

## 2023-02-10 NOTE — Progress Notes (Signed)
[facility]  ROUNDING NOTE   Subjective:   Adam Howell is a 62 y.o male with past medical history of diabetes, hypertension, CAD, Rt AKA, anemia, CHF, and ESRD on dialysis. Patient presents to ED with left leg pain and swelling. He has been admitted for Wound infection [T14.8XXA, L08.9] Left leg cellulitis [L03.116] Infected ulcer of skin (HCC) [L98.499, L08.9]  Patient known to our practice and receives outpatient dialysis treatments at [facility] on a MWF schedule, supervised by [facility] physicians. His last treatment was on Monday. He states he has been having leg pain for about a week. States he smells an odor and has developed drainage. This is a chronic wound and he follows with the wound clinic.   Tib/fib xray negative for osteomyelitis. MRI shows a chronic deep wound with edema.   We have been consulted to manage dialysis needs.    Objective:  Vital signs in last 24 hours:  Temp:  [97.4 F (36.3 C)-98.4 F (36.9 C)] 98.4 F (36.9 C) (11/27 0900) Pulse Rate:  [68-83] 83 (11/27 0900) Resp:  [16-19] 16 (11/27 0900) BP: (109-143)/(61-92) 112/61 (11/27 0900) SpO2:  [95 %-100 %] 95 % (11/27 0900)  Weight change:  Filed Weights   02/09/23 0916  Weight: 117.9 kg    Intake/Output: I/O last 3 completed shifts: In: 897.9 [P.O.:360; IV Piggyback:537.9] Out: -    Intake/Output this shift:  Total I/O In: 480 [P.O.:480] Out: -   Physical Exam: General: NAD  Head: Normocephalic, atraumatic. Moist oral mucosal membranes  Eyes: Anicteric  Lungs:  Clear to auscultation, normal effort  Heart: Regular rate and rhythm  Abdomen:  Soft, nontender  Extremities:  No peripheral edema.  Neurologic: Nonfocal, moving all four extremities  Skin: LLE wound, ace bandage  Access: Lt AVF    Basic Metabolic Panel: Recent Labs  Lab 02/09/23 0944 02/10/23 0518  NA 134* 132*  K 3.9 4.2  CL 92* 90*  CO2 29 27  GLUCOSE 127* 84  BUN 47* 56*  CREATININE 10.09*  11.64*  CALCIUM 8.6* 8.3*    Liver Function Tests: Recent Labs  Lab 02/09/23 0918  AST 13*  ALT 8  ALKPHOS 67  BILITOT 0.4  PROT 7.8  ALBUMIN 3.3*   No results for input(s): "LIPASE", "AMYLASE" in the last 168 hours. No results for input(s): "AMMONIA" in the last 168 hours.  CBC: Recent Labs  Lab 02/09/23 0944 02/10/23 0518  WBC 7.1 6.3  HGB 14.8 13.0  HCT 44.1 38.8*  MCV 84.8 86.2  PLT 250 222    Cardiac Enzymes: No results for input(s): "CKTOTAL", "CKMB", "CKMBINDEX", "TROPONINI" in the last 168 hours.  BNP: Invalid input(s): "POCBNP"  CBG: No results for input(s): "GLUCAP" in the last 168 hours.  Microbiology: Results for orders placed or performed during the hospital encounter of 02/09/23  Culture, blood (routine x 2)     Status: None (Preliminary result)   Collection Time: 02/09/23 10:30 AM   Specimen: BLOOD  Result Value Ref Range Status   Specimen Description BLOOD RIGHT [facility]  Final   Special Requests BOTTLES DRAWN AEROBIC AND ANAEROBIC  Final   Culture   Final    NO GROWTH < 24 HOURS Performed at [facility], [address] [place], [place] [address]    Report Status PENDING  Incomplete  Culture, blood (routine x 2)     Status: None (Preliminary result)   Collection Time: 02/09/23  9:46 PM   Specimen: BLOOD  Result  Value Ref Range Status   Specimen Description BLOOD RIGHT HAND  Final   Special Requests   Final    BOTTLES DRAWN AEROBIC AND ANAEROBIC Blood Culture adequate volume   Culture   Final    NO GROWTH < 12 HOURS Performed at [facility], [address], [place], [place] [address]    Report Status PENDING  Incomplete    Coagulation Studies: No results for input(s): "LABPROT", "INR" in the last 72 hours.  Urinalysis: No results for input(s): "COLORURINE", "LABSPEC", "PHURINE", "GLUCOSEU", "HGBUR", "BILIRUBINUR", "KETONESUR", "PROTEINUR", "UROBILINOGEN", "NITRITE", "LEUKOCYTESUR" in the last 72 hours.  Invalid  input(s): "APPERANCEUR"    Imaging: MR TIBIA FIBULA LEFT W WO CONTRAST  Result Date: 02/09/2023 CLINICAL DATA:  Soft tissue infection suspected, lower leg. Concern for osteomyelitis. Chronic wound on left shin, recently larger with significant drainage. EXAM: MRI OF LOWER LEFT EXTREMITY WITHOUT AND WITH CONTRAST TECHNIQUE: Multiplanar, multisequence MR imaging of the right tibia and fibula was performed both before and after administration of intravenous contrast. CONTRAST:  10mL GADAVIST GADOBUTROL 1 MMOL/ML IV SOLN COMPARISON:  None available FINDINGS: Bones/Joint/Cartilage The distal anterior shin wound described below contacts the anteromedial cortex of the distal third of the tibial diaphysis (axial series 13, images 49-54). No marrow edema is seen in this region, however may be minimal periosteal thickening/irregularity of the peripheral aspect of this anterior tibia, corresponding to minimal lucency within the superficial cortex at the superior aspect of the wound on today's 02/09/2023 radiographs. Ligaments No ligament tear is seen. Muscles and Tendons There is moderate edema within the edema within the medial head of the gastrocnemius muscle there is edema within the peripheral predominantly anterior aspect of the distal tibialis anterior tendon at the musculotendinous junction adjacent to the distal anterior shin wound (axial series 13, images 39 through 58). Soft tissues There is again a deep wound within the distal anterior shin along an approximate 7 cm craniocaudal length. This contacts the anteromedial aspect of the tibial cortex (axial series 13 images 49 through 54). No rim enhancing abscess is seen. IMPRESSION: 1. Chronic deep wound within the distal anterior shin along an approximate 7 cm craniocaudal length. This contacts the anteromedial cortex of the distal third of the tibial diaphysis. No marrow edema is seen in this region, however there may be minimal periosteal  thickening/irregularity of the peripheral aspect of this anterior tibia, corresponding to minimal lucency within the superficial cortex at the superior aspect of the wound on today's 02/09/2023 radiographs. This may represent periosteal reaction from age indeterminate minimal infection within the peripheral periosteum but not within the tibial marrow. 2. There is edema within the medial head of the gastrocnemius muscle and within the peripheral predominantly anterior aspect of the distal tibialis anterior tendon at the musculotendinous junction adjacent to the distal anterior shin wound. 3. No rim enhancing abscess is seen. Electronically Signed   By: Neita Garnet M.D.   On: 02/09/2023 19:40   DG Tibia/Fibula Left  Result Date: 02/09/2023 CLINICAL DATA:  Left leg pain and swelling for several days. Diabetic wound is noted. EXAM: LEFT TIBIA AND FIBULA - 2 VIEW COMPARISON:  None Available. FINDINGS: There is no evidence of fracture or other focal bone lesions. Large soft tissue ulceration is seen anterior to the distal tibia. No underlying lytic destruction is noted. IMPRESSION: No definite evidence of fracture or osteomyelitis. Electronically Signed   By: Lupita Raider M.D.   On: 02/09/2023 11:31     Medications:  piperacillin-tazobactam (ZOSYN)  IV 2.25 g (02/10/23 1300)   vancomycin      amiodarone  200 mg Oral BID   apixaban  2.5 mg Oral BID   calcitRIOL  1.5 mcg Oral Q M,W,F-1800   Chlorhexidine Gluconate Cloth  6 each Topical Q0600   cinacalcet  30 mg Oral Q breakfast   docusate sodium  100 mg Oral BID   leptospermum manuka honey  1 Application Topical Daily   multivitamin  1 tablet Oral QHS   pregabalin  25 mg Oral Daily   rosuvastatin  40 mg Oral Daily   senna  2 tablet Oral QHS   sevelamer carbonate  2,400 mg Oral TID WC   acetaminophen **OR** acetaminophen, heparin, hydrALAZINE, HYDROcodone-acetaminophen, HYDROmorphone (DILAUDID) injection, lidocaine-prilocaine, LORazepam,  ondansetron **OR** ondansetron (ZOFRAN) IV, pentafluoroprop-tetrafluoroeth  Assessment/ Plan:  [name] is a 62 y.o.  male with past medical history of diabetes, hypertension, CAD, Rt AKA, anemia, CHF, and ESRD on dialysis. Patient presents to ED with left leg pain and swelling. He has been admitted for Wound infection [T14.8XXA, L08.9] Left leg cellulitis [L03.116] Infected ulcer of skin (HCC) [L98.499, L08.9]   End stage renal disease on hemodialysis. Last treatment completed on Monday. Will plan for treatment today. Next treatment scheduled for Friday.   2. Anemia of chronic kidney disease Lab Results  Component Value Date   HGB 13.0 02/10/2023    Hgb within desired goal.   3. Wound infection, Left leg. Imaging negative for osteomyelitis. Receiving antibiotics, Zosyn and Vancomycin.   4. Secondary Hyperparathyroidism: with outpatient labs: PTH 558, phosphorus 7.0, calcium 8.2 on 01/20/23.   Lab Results  Component Value Date   CALCIUM 8.3 (L) 02/10/2023   PHOS 2.2 (L) 04/03/2022    Prescribed calcitriol, cinacalcet and sevelamer outpatient. Will continue to monitor bone minerals during this admission.    LOS: 1 Adam Howell 11/27/20242:36 PM

## 2023-02-11 DIAGNOSIS — L97921 Non-pressure chronic ulcer of unspecified part of left lower leg limited to breakdown of skin: Secondary | ICD-10-CM | POA: Diagnosis not present

## 2023-02-11 DIAGNOSIS — N186 End stage renal disease: Secondary | ICD-10-CM | POA: Diagnosis not present

## 2023-02-11 DIAGNOSIS — E1122 Type 2 diabetes mellitus with diabetic chronic kidney disease: Secondary | ICD-10-CM | POA: Diagnosis not present

## 2023-02-11 DIAGNOSIS — T148XXA Other injury of unspecified body region, initial encounter: Secondary | ICD-10-CM | POA: Diagnosis not present

## 2023-02-11 LAB — RENAL FUNCTION PANEL
Albumin: 2.7 g/dL — ABNORMAL LOW (ref 3.5–5.0)
Anion gap: 13 (ref 5–15)
BUN: 48 mg/dL — ABNORMAL HIGH (ref 8–23)
CO2: 26 mmol/L (ref 22–32)
Calcium: 7.9 mg/dL — ABNORMAL LOW (ref 8.9–10.3)
Chloride: 95 mmol/L — ABNORMAL LOW (ref 98–111)
Creatinine, Ser: 10.32 mg/dL — ABNORMAL HIGH (ref 0.61–1.24)
GFR, Estimated: 5 mL/min — ABNORMAL LOW (ref >60)
Glucose, Bld: 83 mg/dL (ref 70–99)
Phosphorus: 4.8 mg/dL — ABNORMAL HIGH (ref 2.5–4.6)
Potassium: 4.5 mmol/L (ref 3.5–5.1)
Sodium: 134 mmol/L — ABNORMAL LOW (ref 135–145)

## 2023-02-11 MED ORDER — SODIUM CHLORIDE 0.9 % IV SOLN
1.0000 g | INTRAVENOUS | Status: DC
  Administered 2023-02-12 – 2023-02-16 (×4): 1 g via INTRAVENOUS
  Filled 2023-02-11 (×5): qty 10

## 2023-02-11 MED ORDER — METRONIDAZOLE 500 MG PO TABS
500.0000 mg | ORAL_TABLET | Freq: Two times a day (BID) | ORAL | Status: DC
  Administered 2023-02-11 – 2023-02-17 (×13): 500 mg via ORAL
  Filled 2023-02-11 (×13): qty 1

## 2023-02-11 MED ORDER — HYDROMORPHONE HCL 1 MG/ML IJ SOLN
1.0000 mg | INTRAMUSCULAR | Status: DC | PRN
  Administered 2023-02-11 – 2023-02-14 (×10): 1 mg via INTRAVENOUS
  Filled 2023-02-11 (×9): qty 1

## 2023-02-11 MED ORDER — LINEZOLID 600 MG PO TABS
600.0000 mg | ORAL_TABLET | Freq: Two times a day (BID) | ORAL | Status: DC
  Administered 2023-02-11 – 2023-02-17 (×13): 600 mg via ORAL
  Filled 2023-02-11 (×14): qty 1

## 2023-02-11 MED ORDER — SODIUM CHLORIDE 0.9 % IV SOLN
1.0000 g | Freq: Once | INTRAVENOUS | Status: AC
  Administered 2023-02-11: 1 g via INTRAVENOUS
  Filled 2023-02-11: qty 10

## 2023-02-11 NOTE — Progress Notes (Signed)
Progress Note   Patient: Adam Howell MWU:[id number] DOB: [birth year] DOA: 02/09/2023     2 DOS: the patient was seen and examined on 02/11/2023   Brief hospital course: 62 y.o. male male w/ PMH ESRD on HD MWF, anemia chronic kidney disease, type 2 diabetes, chronic pain syndrome, chronic HFrEF, a/p R AKA, HLD, HTN, CAD.  Presents to the emergency department from home with C/L left leg pain and swelling x 3 to 4 days, foul odor to wound and leaking fluid for past few days.  Has been seeing wound care for this, recently treated outpatient by his PCP with doxycycline but wound has gotten worse.   Patient is seen by vascular team, advised ABI and if needed angiogram on Monday. He is on broad spectrum IV antibiotic therapy.  Assessment and Plan: Ulcer left lower extremity with infection. Patient does have draining, worsening pain. MRI left lower extremity negative for osteomyelitis. Vancomycin and Zosyn changed to Linezolid, Cefepime and Flagyl therapy. Continue pain control. Vascular surgery team recommended left lower extremity ultrasound with ABI study. Possible left lower extremity angio on Monday pending ABI.  ESRD on hemodialysis Scheduled for Monday, Wednesday and Friday. Nephrology on board for dialysis needs.  Chronic atrial fibrillation: Continue amiodarone, Eliquis therapy.  Chronic diastolic CHF No exacerbation. GDMT limited due to low blood pressures.  Chronic pain syndrome: Continue home dose opiates, Lyrica therapy. IV Dilaudid as needed will be weaned slowly.  Type 2 diabetes mellitus: A1c 5.9. Continue sliding scale insulin for hyperglycemia.  Chronic constipation: Due to chronic opiate use. Continue constipation regimen.  Obesity with BMI 32.5: Diet, exercise and weight reduction advised.        Out of bed to chair. Incentive spirometry. Nursing supportive care. Fall, aspiration precautions. DVT prophylaxis   Code Status: Full  Code  Subjective: Patient is seen and examined today morning. He is lying in bed. Has pain left lower extremity. Able to get out of bed. Eating fair.  Physical Exam: Vitals:   02/10/23 1824 02/10/23 2012 02/11/23 0450 02/11/23 0757  BP: 112/81 103/60 95/68 111/66  Pulse: 75 82 83 71  Resp: 18 18  17   Temp: 98.4 F (36.9 C) 99.6 F (37.6 C) 98.5 F (36.9 C) 98.1 F (36.7 C)  TempSrc:      SpO2: 98% 100% 99% 99%  Weight:      Height:        General - Elderly obese African American male, no apparent distress HEENT - PERRLA, EOMI, atraumatic head, non tender sinuses. Lung - Clear, diffuse rales, rhonchi, no wheezes. Heart - S1, S2 heard, no murmurs, rubs, 1+ pedal edema. Abdomen - Soft, non tender obese, bowel sounds good Neuro - Alert, awake and oriented x 3, non focal exam. Skin - Warm and dry. Left arm fistula, Left leg dressing noted.  Data Reviewed:      Latest Ref Rng & Units 02/10/2023    5:18 AM 02/09/2023    9:44 AM 01/12/2023    2:14 PM  CBC  WBC 4.0 - 10.5 K/uL 6.3  7.1  5.5   Hemoglobin 13.0 - 17.0 g/dL 72.5  36.6  44.0   Hematocrit 39.0 - 52.0 % 38.8  44.1  51.0   Platelets 150 - 400 K/uL 222  250  257       Latest Ref Rng & Units 02/11/2023    5:20 AM 02/10/2023    5:18 AM 02/09/2023    9:44 AM  BMP  Glucose 70 -  99 mg/dL 83  84  829   BUN 8 - 23 mg/dL 48  56  47   Creatinine 0.61 - 1.24 mg/dL 56.21  30.86  57.84   Sodium 135 - 145 mmol/L 134  132  134   Potassium 3.5 - 5.1 mmol/L 4.5  4.2  3.9   Chloride 98 - 111 mmol/L 95  90  92   CO2 22 - 32 mmol/L 26  27  29    Calcium 8.9 - 10.3 mg/dL 7.9  8.3  8.6    MR TIBIA FIBULA LEFT W WO CONTRAST  Result Date: 02/09/2023 CLINICAL DATA:  Soft tissue infection suspected, lower leg. Concern for osteomyelitis. Chronic wound on left shin, recently larger with significant drainage. EXAM: MRI OF LOWER LEFT EXTREMITY WITHOUT AND WITH CONTRAST TECHNIQUE: Multiplanar, multisequence MR imaging of the right tibia  and fibula was performed both before and after administration of intravenous contrast. CONTRAST:  10mL GADAVIST GADOBUTROL 1 MMOL/ML IV SOLN COMPARISON:  None available FINDINGS: Bones/Joint/Cartilage The distal anterior shin wound described below contacts the anteromedial cortex of the distal third of the tibial diaphysis (axial series 13, images 49-54). No marrow edema is seen in this region, however may be minimal periosteal thickening/irregularity of the peripheral aspect of this anterior tibia, corresponding to minimal lucency within the superficial cortex at the superior aspect of the wound on today's 02/09/2023 radiographs. Ligaments No ligament tear is seen. Muscles and Tendons There is moderate edema within the edema within the medial head of the gastrocnemius muscle there is edema within the peripheral predominantly anterior aspect of the distal tibialis anterior tendon at the musculotendinous junction adjacent to the distal anterior shin wound (axial series 13, images 39 through 58). Soft tissues There is again a deep wound within the distal anterior shin along an approximate 7 cm craniocaudal length. This contacts the anteromedial aspect of the tibial cortex (axial series 13 images 49 through 54). No rim enhancing abscess is seen. IMPRESSION: 1. Chronic deep wound within the distal anterior shin along an approximate 7 cm craniocaudal length. This contacts the anteromedial cortex of the distal third of the tibial diaphysis. No marrow edema is seen in this region, however there may be minimal periosteal thickening/irregularity of the peripheral aspect of this anterior tibia, corresponding to minimal lucency within the superficial cortex at the superior aspect of the wound on today's 02/09/2023 radiographs. This may represent periosteal reaction from age indeterminate minimal infection within the peripheral periosteum but not within the tibial marrow. 2. There is edema within the medial head of the  gastrocnemius muscle and within the peripheral predominantly anterior aspect of the distal tibialis anterior tendon at the musculotendinous junction adjacent to the distal anterior shin wound. 3. No rim enhancing abscess is seen. Electronically Signed   By: Neita Garnet M.D.   On: 02/09/2023 19:40     Family Communication: Discussed with patient, he understand and agree. All questions answereed.  Disposition: Status is: Inpatient Remains inpatient appropriate because: wound infection, IV antibiotics. Vascular team follow up.  Planned Discharge Destination: Home with Home Health     Time spent: 36 minutes  Author: [name], MD 02/11/2023 1:08 PM Secure chat 7am to 7pm For on call review [email or web address].

## 2023-02-11 NOTE — Plan of Care (Signed)
  Problem: Education: Goal: Knowledge of General Education information will improve Description: Including pain rating scale, medication(s)/side effects and non-pharmacologic comfort measures 02/11/2023 1709 by Danella Sensing, RN Outcome: Progressing 02/11/2023 1709 by Danella Sensing, RN Outcome: Progressing   Problem: Nutrition: Goal: Adequate nutrition will be maintained Outcome: Progressing   Problem: Coping: Goal: Level of anxiety will decrease Outcome: Progressing   Problem: Safety: Goal: Ability to remain free from injury will improve Outcome: Progressing   Problem: Skin Integrity: Goal: Risk for impaired skin integrity will decrease Outcome: Progressing

## 2023-02-11 NOTE — Consult Note (Addendum)
WOC Nurse Consult Note: Reason for Consult: LLE wound Necrotic history of  RBKA LLE with adequate ABIs previously, seen by vascular 11/27, planning for vascular intervention if possible.  Wound type: arterial ulceration that is necrotic  Pressure Injury POA:NA Measurement: see nursing flow sheet Wound bed:95% necrotic/ 5% pink along wound perimeter  Drainage (amount, consistency, odor) purulent drainage, moderate per nursing notes Periwound: intact  Dressing procedure/placement/frequency: Medihoney ordered at time admission per another provider, top with ABD pad, secure with kerlix. Change daily    Re consult if needed, will not follow at this time. Thanks  Beniah Magnan [employer], RN,CWOCN, CNS, CWON-AP [phone])

## 2023-02-12 ENCOUNTER — Ambulatory Visit: Payer: Self-pay | Admitting: *Deleted

## 2023-02-12 ENCOUNTER — Encounter: Payer: Self-pay | Admitting: *Deleted

## 2023-02-12 ENCOUNTER — Telehealth: Payer: Self-pay | Admitting: *Deleted

## 2023-02-12 ENCOUNTER — Inpatient Hospital Stay: Payer: Medicare HMO

## 2023-02-12 DIAGNOSIS — N186 End stage renal disease: Secondary | ICD-10-CM | POA: Diagnosis not present

## 2023-02-12 DIAGNOSIS — E1122 Type 2 diabetes mellitus with diabetic chronic kidney disease: Secondary | ICD-10-CM | POA: Diagnosis not present

## 2023-02-12 DIAGNOSIS — T148XXA Other injury of unspecified body region, initial encounter: Secondary | ICD-10-CM | POA: Diagnosis not present

## 2023-02-12 DIAGNOSIS — L97921 Non-pressure chronic ulcer of unspecified part of left lower leg limited to breakdown of skin: Secondary | ICD-10-CM | POA: Diagnosis not present

## 2023-02-12 LAB — CBC
HCT: 34.8 % — ABNORMAL LOW (ref 39.0–52.0)
Hemoglobin: 11.7 g/dL — ABNORMAL LOW (ref 13.0–17.0)
MCH: 29.2 pg (ref 26.0–34.0)
MCHC: 33.6 g/dL (ref 30.0–36.0)
MCV: 86.8 fL (ref 80.0–100.0)
Platelets: 237 10*3/uL (ref 150–400)
RBC: 4.01 MIL/uL — ABNORMAL LOW (ref 4.22–5.81)
RDW: 15.2 % (ref 11.5–15.5)
WBC: 6.5 10*3/uL (ref 4.0–10.5)
nRBC: 0 % (ref 0.0–0.2)

## 2023-02-12 LAB — RENAL FUNCTION PANEL
Albumin: 2.6 g/dL — ABNORMAL LOW (ref 3.5–5.0)
Anion gap: 16 — ABNORMAL HIGH (ref 5–15)
BUN: 63 mg/dL — ABNORMAL HIGH (ref 8–23)
CO2: 24 mmol/L (ref 22–32)
Calcium: 7.7 mg/dL — ABNORMAL LOW (ref 8.9–10.3)
Chloride: 91 mmol/L — ABNORMAL LOW (ref 98–111)
Creatinine, Ser: 12.36 mg/dL — ABNORMAL HIGH (ref 0.61–1.24)
GFR, Estimated: 4 mL/min — ABNORMAL LOW (ref >60)
Glucose, Bld: 102 mg/dL — ABNORMAL HIGH (ref 70–99)
Phosphorus: 5.6 mg/dL — ABNORMAL HIGH (ref 2.5–4.6)
Potassium: 4.7 mmol/L (ref 3.5–5.1)
Sodium: 131 mmol/L — ABNORMAL LOW (ref 135–145)

## 2023-02-12 MED ORDER — PENTAFLUOROPROP-TETRAFLUOROETH EX AERO
INHALATION_SPRAY | CUTANEOUS | Status: AC
  Filled 2023-02-12: qty 30

## 2023-02-12 MED ORDER — HYDROMORPHONE HCL 1 MG/ML IJ SOLN
INTRAMUSCULAR | Status: AC
  Filled 2023-02-12: qty 1

## 2023-02-12 NOTE — Progress Notes (Signed)
Progress Note   Patient: Adam Howell GUY:[id number] DOB: October 19, 1960 DOA: 02/09/2023     3 DOS: the patient was seen and examined on 02/12/2023   Brief hospital course: 62 y.o. male male w/ PMH ESRD on HD MWF, anemia chronic kidney disease, type 2 diabetes, chronic pain syndrome, chronic HFrEF, a/p R AKA, HLD, HTN, CAD.  Presents to the emergency department from home with C/L left leg pain and swelling x 3 to 4 days, foul odor to wound and leaking fluid for past few days.  Has been seeing wound care for this, recently treated outpatient by his PCP with doxycycline but wound has gotten worse.   Patient is seen by vascular team, advised ABI and if needed angiogram on Monday. He is on broad spectrum IV antibiotic therapy.  Assessment and Plan: Ulcer left lower extremity with infection. Patient does have draining, worsening pain. MRI left lower extremity negative for osteomyelitis. Continue  Linezolid, Cefepime and Flagyl therapy. Continue pain control. Vascular surgery team recommended left lower extremity ultrasound with ABI study which is ordered. Possible left lower extremity angio on Monday pending ABI.  ESRD on hemodialysis Scheduled for Monday, Wednesday and Friday. Nephrology on board for dialysis needs.  Chronic atrial fibrillation: Continue amiodarone, Eliquis therapy.  Chronic diastolic CHF No exacerbation. GDMT limited due to low blood pressures.  Chronic pain syndrome: Continue home dose opiates, Lyrica therapy. IV Dilaudid as needed will be weaned slowly.  Type 2 diabetes mellitus: A1c 5.9. Continue sliding scale insulin for hyperglycemia.  Chronic constipation: Due to chronic opiate use. Continue constipation regimen.  Obesity with BMI 32.5: Diet, exercise and weight reduction advised.  Active Pressure Injury/Wound(s)     Pressure Ulcer  Duration          Pressure Injury 03/19/22 Ischial tuberosity Left Stage 2 -  Partial thickness loss of dermis  presenting as a shallow open injury with a red, pink wound bed without slough. 329 days   Pressure Injury 03/19/22 Ischial tuberosity Right Stage 2 -  Partial thickness loss of dermis presenting as a shallow open injury with a red, pink wound bed without slough. 329 days           Out of bed to chair. Incentive spirometry. Nursing supportive care. Fall, aspiration precautions. DVT prophylaxis   Code Status: Full Code  Subjective: Patient is seen and examined today morning during HD. He was sleeping, but upon waking complains of pain left lower extremity. Asks for pain meds. Eating fair, has constipation.  Physical Exam: Vitals:   02/12/23 1130 02/12/23 1200 02/12/23 1230 02/12/23 1252  BP: 109/71 128/67 118/64 105/66  Pulse: (!) 56 60 75 60  Resp: 15 (!) 24 19 17   Temp:    97.7 F (36.5 C)  TempSrc:    Oral  SpO2: 100% 97% 96% 99%  Weight:      Height:        General - Elderly obese African American male, no apparent distress HEENT - PERRLA, EOMI, atraumatic head, non tender sinuses. Lung - Clear, diffuse rales, rhonchi, no wheezes. Heart - S1, S2 heard, no murmurs, rubs, 1+ pedal edema. Abdomen - Soft, non tender obese, bowel sounds good Neuro - sleeping, arousable, non focal exam. Skin - Warm and dry. Left arm fistula, Left leg dressing noted.  Data Reviewed:      Latest Ref Rng & Units 02/12/2023    8:23 AM 02/10/2023    5:18 AM 02/09/2023    9:44 AM  CBC  WBC 4.0 - 10.5 K/uL 6.5  6.3  7.1   Hemoglobin 13.0 - 17.0 g/dL 16.1  09.6  04.5   Hematocrit 39.0 - 52.0 % 34.8  38.8  44.1   Platelets 150 - 400 K/uL 237  222  250       Latest Ref Rng & Units 02/12/2023    8:23 AM 02/11/2023    5:20 AM 02/10/2023    5:18 AM  BMP  Glucose 70 - 99 mg/dL 409  83  84   BUN 8 - 23 mg/dL 63  48  56   Creatinine 0.61 - 1.24 mg/dL 81.19  14.78  29.56   Sodium 135 - 145 mmol/L 131  134  132   Potassium 3.5 - 5.1 mmol/L 4.7  4.5  4.2   Chloride 98 - 111 mmol/L 91  95  90    CO2 22 - 32 mmol/L 24  26  27    Calcium 8.9 - 10.3 mg/dL 7.7  7.9  8.3    No results found.   Family Communication: Discussed with patient, he understand and agree. All questions answereed.  Disposition: Status is: Inpatient Remains inpatient appropriate because: wound infection, IV antibiotics. Vascular follow up and work up.  Planned Discharge Destination: Home with Home Health     Time spent: 37 minutes  Author: [name], MD 02/12/2023 1:04 PM Secure chat 7am to 7pm For on call review [email or web address].

## 2023-02-12 NOTE — Progress Notes (Signed)
[facility]  ROUNDING NOTE   Subjective:   Adam Howell is a 62 y.o male with past medical history of diabetes, hypertension, CAD, Rt AKA, anemia, CHF, and ESRD on dialysis. Patient presents to ED with left leg pain and swelling. He has been admitted for Wound infection [T14.8XXA, L08.9] Left leg cellulitis [L03.116] Infected ulcer of skin (HCC) [L98.499, L08.9]  Patient known to our practice and receives outpatient dialysis treatments at [facility] on a MWF schedule, supervised by [facility] physicians.   Patient seen and evaluated during dialysis   HEMODIALYSIS FLOWSHEET:  Blood Flow Rate (mL/min): 0 mL/min Arterial Pressure (mmHg): -122.21 mmHg Venous Pressure (mmHg): 234.73 mmHg TMP (mmHg): 10.3 mmHg Ultrafiltration Rate (mL/min): 1110 mL/min Dialysate Flow Rate (mL/min): 300 ml/min  Tolerating treatment well Complains of mild left leg pain.    Objective:  Vital signs in last 24 hours:  Temp:  [97.6 F (36.4 C)-98.6 F (37 C)] 97.7 F (36.5 C) (11/29 1252) Pulse Rate:  [56-75] 60 (11/29 1252) Resp:  [10-24] 17 (11/29 1252) BP: (87-143)/(53-94) 105/66 (11/29 1252) SpO2:  [94 %-100 %] 99 % (11/29 1252) Weight:  [101.8 kg-102 kg] 101.8 kg (11/29 1252)  Weight change:  Filed Weights   02/09/23 0916 02/12/23 0759 02/12/23 1252  Weight: 117.9 kg 102 kg 101.8 kg    Intake/Output: I/O last 3 completed shifts: In: 550 [P.O.:200; IV Piggyback:350] Out: -    Intake/Output this shift:  Total I/O In: -  Out: 2000 [Other:2000]  Physical Exam: General: NAD  Head: Normocephalic, atraumatic. Moist oral mucosal membranes  Eyes: Anicteric  Lungs:  Clear to auscultation, normal effort  Heart: Regular rate and rhythm  Abdomen:  Soft, nontender  Extremities:  No peripheral edema.  Neurologic: Nonfocal, moving all four extremities  Skin: LLE wound, ace bandage  Access: Lt AVF    Basic Metabolic Panel: Recent Labs  Lab 02/09/23 0944 02/10/23 0518  02/11/23 0520 02/12/23 0823  NA 134* 132* 134* 131*  K 3.9 4.2 4.5 4.7  CL 92* 90* 95* 91*  CO2 29 27 26 24   GLUCOSE 127* 84 83 102*  BUN 47* 56* 48* 63*  CREATININE 10.09* 11.64* 10.32* 12.36*  CALCIUM 8.6* 8.3* 7.9* 7.7*  PHOS  --  6.1* 4.8* 5.6*    Liver Function Tests: Recent Labs  Lab 02/09/23 0918 02/11/23 0520 02/12/23 0823  AST 13*  --   --   ALT 8  --   --   ALKPHOS 67  --   --   BILITOT 0.4  --   --   PROT 7.8  --   --   ALBUMIN 3.3* 2.7* 2.6*   No results for input(s): "LIPASE", "AMYLASE" in the last 168 hours. No results for input(s): "AMMONIA" in the last 168 hours.  CBC: Recent Labs  Lab 02/09/23 0944 02/10/23 0518 02/12/23 0823  WBC 7.1 6.3 6.5  HGB 14.8 13.0 11.7*  HCT 44.1 38.8* 34.8*  MCV 84.8 86.2 86.8  PLT 250 222 237    Cardiac Enzymes: No results for input(s): "CKTOTAL", "CKMB", "CKMBINDEX", "TROPONINI" in the last 168 hours.  BNP: Invalid input(s): "POCBNP"  CBG: No results for input(s): "GLUCAP" in the last 168 hours.  Microbiology: Results for orders placed or performed during the hospital encounter of 02/09/23  Culture, blood (routine x 2)     Status: None (Preliminary result)   Collection Time: 02/09/23 10:30 AM   Specimen: BLOOD  Result Value Ref Range Status   Specimen Description  BLOOD RIGHT AC  Final   Special Requests BOTTLES DRAWN AEROBIC AND ANAEROBIC  Final   Culture   Final    NO GROWTH 3 DAYS Performed at [facility], [address] [place], [place] [address]    Report Status PENDING  Incomplete  Culture, blood (routine x 2)     Status: None (Preliminary result)   Collection Time: 02/09/23  9:46 PM   Specimen: BLOOD  Result Value Ref Range Status   Specimen Description BLOOD RIGHT HAND  Final   Special Requests   Final    BOTTLES DRAWN AEROBIC AND ANAEROBIC Blood Culture adequate volume   Culture   Final    NO GROWTH 3 DAYS Performed at [facility], [address], [place],  [place] [address]    Report Status PENDING  Incomplete    Coagulation Studies: No results for input(s): "LABPROT", "INR" in the last 72 hours.  Urinalysis: No results for input(s): "COLORURINE", "LABSPEC", "PHURINE", "GLUCOSEU", "HGBUR", "BILIRUBINUR", "KETONESUR", "PROTEINUR", "UROBILINOGEN", "NITRITE", "LEUKOCYTESUR" in the last 72 hours.  Invalid input(s): "APPERANCEUR"    Imaging: No results found.   Medications:    ceFEPime (MAXIPIME) IV      amiodarone  200 mg Oral BID   apixaban  2.5 mg Oral BID   calcitRIOL  1.5 mcg Oral Q M,W,F-1800   Chlorhexidine Gluconate Cloth  6 each Topical Q0600   cinacalcet  30 mg Oral Q breakfast   docusate sodium  100 mg Oral BID   leptospermum manuka honey  1 Application Topical Daily   linezolid  600 mg Oral Q12H   metroNIDAZOLE  500 mg Oral Q12H   multivitamin  1 tablet Oral QHS   pregabalin  25 mg Oral Daily   rosuvastatin  40 mg Oral Daily   senna  2 tablet Oral QHS   sevelamer carbonate  2,400 mg Oral TID WC   acetaminophen **OR** acetaminophen, hydrALAZINE, HYDROcodone-acetaminophen, HYDROmorphone (DILAUDID) injection, LORazepam, ondansetron **OR** ondansetron (ZOFRAN) IV  Assessment/ Plan:  [name] is a 62 y.o.  male with past medical history of diabetes, hypertension, CAD, Rt AKA, anemia, CHF, and ESRD on dialysis. Patient presents to ED with left leg pain and swelling. He has been admitted for Wound infection [T14.8XXA, L08.9] Left leg cellulitis [L03.116] Infected ulcer of skin (HCC) [L98.499, L08.9]   End stage renal disease on hemodialysis.  Dialysis received today, UF goal 2 L as tolerated.  Next treatment scheduled for Monday.  2. Anemia of chronic kidney disease Lab Results  Component Value Date   HGB 11.7 (L) 02/12/2023    Hgb at goal.  No need for ESA's at this time.  3. Wound infection, Left leg. Imaging negative for osteomyelitis. Receiving cefepime, Flagyl, linezolid.  Awaiting vascular ABI  study.  4. Secondary Hyperparathyroidism: with outpatient labs: PTH 558, phosphorus 7.0, calcium 8.2 on 01/20/23.   Lab Results  Component Value Date   CALCIUM 7.7 (L) 02/12/2023   PHOS 5.6 (H) 02/12/2023    Prescribed calcitriol, cinacalcet and sevelamer outpatient.  These medications continued during this admission.   LOS: 3 Adam Howell 11/29/20241:28 PM

## 2023-02-12 NOTE — Patient Outreach (Signed)
  Care Coordination   02/12/2023 Name: Adam Howell MRN: [id number] DOB: Oct 28, 1960   Care Coordination Outreach Attempts:  An unsuccessful telephone outreach was attempted today to offer the patient information about available care coordination services. Patient currently in the hospital, phone call made to patient's niece/caregiver. Phone rang, however no voicemail was able to be left.  Follow Up Plan:  Additional outreach attempts will be made to offer the patient care coordination information and services.   Encounter Outcome:  No Answer   Care Coordination Interventions:  No, not indicated     Bernie Ransford, LCSW   [facility], [facility] Licensed Clinical Social Worker Care Coordinator  Direct Dial: [phone]

## 2023-02-12 NOTE — Plan of Care (Signed)
  Problem: Clinical Measurements: Goal: Ability to maintain clinical measurements within normal limits will improve Outcome: Progressing Goal: Will remain free from infection Outcome: Progressing Goal: Diagnostic test results will improve Outcome: Progressing Goal: Respiratory complications will improve Outcome: Progressing Goal: Cardiovascular complication will be avoided Outcome: Progressing   Problem: Health Behavior/Discharge Planning: Goal: Ability to manage health-related needs will improve Outcome: Progressing   Problem: Education: Goal: Knowledge of General Education information will improve Description: Including pain rating scale, medication(s)/side effects and non-pharmacologic comfort measures Outcome: Progressing   Problem: Coping: Goal: Level of anxiety will decrease Outcome: Progressing   Problem: Nutrition: Goal: Adequate nutrition will be maintained Outcome: Progressing   Problem: Elimination: Goal: Will not experience complications related to bowel motility Outcome: Progressing Goal: Will not experience complications related to urinary retention Outcome: Progressing

## 2023-02-12 NOTE — Patient Outreach (Signed)
Late Entry for 02/10/23 This (covering) CSW received message from PCP regarding concerns for pt's needs-  transportation, missed dialysis and he missed wound clinic visit today due to no transportation. Also, was currently in the ER and now admitted. This CSW has shared  PCP message and concerns with the [facility] team at [facility] where pt has been admitted.  CSW also left VM message for Marchelle Folks (Dialysis center contact).  Hopefully they can address these concerns and needs while he is hospitalized and as they make d/c plans-   Will update assigned CSW to above for further follow up.   Reece Levy, MSW, LCSW [place]  [facility], [facility] Licensed Clinical Social Worker Care Coordinator  [phone]

## 2023-02-12 NOTE — Plan of Care (Signed)
Patient has complained of left leg pain, given IV dilaudid to manage pain.  Dressing changed to left leg per orders. Dressing clean, dry and intact.  Problem: Education: Goal: Knowledge of General Education information will improve Description: Including pain rating scale, medication(s)/side effects and non-pharmacologic comfort measures Outcome: Progressing   Problem: Health Behavior/Discharge Planning: Goal: Ability to manage health-related needs will improve Outcome: Progressing   Problem: Clinical Measurements: Goal: Ability to maintain clinical measurements within normal limits will improve Outcome: Progressing Goal: Will remain free from infection Outcome: Progressing Goal: Diagnostic test results will improve Outcome: Progressing Goal: Respiratory complications will improve Outcome: Progressing Goal: Cardiovascular complication will be avoided Outcome: Progressing   Problem: Activity: Goal: Risk for activity intolerance will decrease Outcome: Progressing   Problem: Nutrition: Goal: Adequate nutrition will be maintained Outcome: Progressing   Problem: Coping: Goal: Level of anxiety will decrease Outcome: Progressing   Problem: Elimination: Goal: Will not experience complications related to bowel motility Outcome: Progressing Goal: Will not experience complications related to urinary retention Outcome: Progressing   Problem: Safety: Goal: Ability to remain free from injury will improve Outcome: Progressing   Problem: Skin Integrity: Goal: Risk for impaired skin integrity will decrease Outcome: Progressing   Problem: Pain Management: Goal: General experience of comfort will improve Outcome: Progressing

## 2023-02-12 NOTE — Progress Notes (Signed)
Hemodialysis note  Received patient in bed to unit. Alert and oriented.  Informed consent signed and in chart.  Treatment initiated: 0824 Treatment completed: 1252  Patient tolerated well. Transported back to room, alert without acute distress.  Report given to patient's RN.   Access used: Left arm AVF Access issues: high venous pressure  Total UF removed: 2L Medication(s) given:  Dilaudid 1 mg IV and Hydrocodone Acetaminophen 5-325 mg tab PO  Post HD weight: 101.8 kg   Wolfgang Phoenix [facility]

## 2023-02-13 DIAGNOSIS — E1122 Type 2 diabetes mellitus with diabetic chronic kidney disease: Secondary | ICD-10-CM | POA: Diagnosis not present

## 2023-02-13 DIAGNOSIS — L97921 Non-pressure chronic ulcer of unspecified part of left lower leg limited to breakdown of skin: Secondary | ICD-10-CM | POA: Diagnosis not present

## 2023-02-13 DIAGNOSIS — Z992 Dependence on renal dialysis: Secondary | ICD-10-CM | POA: Diagnosis not present

## 2023-02-13 DIAGNOSIS — N186 End stage renal disease: Secondary | ICD-10-CM | POA: Diagnosis not present

## 2023-02-13 DIAGNOSIS — T148XXA Other injury of unspecified body region, initial encounter: Secondary | ICD-10-CM | POA: Diagnosis not present

## 2023-02-13 DIAGNOSIS — I120 Hypertensive chronic kidney disease with stage 5 chronic kidney disease or end stage renal disease: Secondary | ICD-10-CM | POA: Diagnosis not present

## 2023-02-13 MED ORDER — MELATONIN 5 MG PO TABS
2.5000 mg | ORAL_TABLET | Freq: Every day | ORAL | Status: DC
  Administered 2023-02-13 – 2023-03-03 (×19): 2.5 mg via ORAL
  Filled 2023-02-13 (×19): qty 1

## 2023-02-13 NOTE — Plan of Care (Signed)
  Problem: Education: Goal: Knowledge of General Education information will improve Description: Including pain rating scale, medication(s)/side effects and non-pharmacologic comfort measures Outcome: Progressing   Problem: Health Behavior/Discharge Planning: Goal: Ability to manage health-related needs will improve Outcome: Progressing   Problem: Clinical Measurements: Goal: Ability to maintain clinical measurements within normal limits will improve Outcome: Progressing Goal: Will remain free from infection Outcome: Progressing Goal: Diagnostic test results will improve Outcome: Progressing Goal: Respiratory complications will improve Outcome: Progressing Goal: Cardiovascular complication will be avoided Outcome: Progressing   Problem: Safety: Goal: Ability to remain free from injury will improve Outcome: Progressing   Problem: Pain Management: Goal: General experience of comfort will improve Outcome: Progressing   Problem: Skin Integrity: Goal: Risk for impaired skin integrity will decrease Outcome: Progressing   Problem: Elimination: Goal: Will not experience complications related to bowel motility Outcome: Progressing Goal: Will not experience complications related to urinary retention Outcome: Progressing

## 2023-02-13 NOTE — Patient Instructions (Incomplete)

## 2023-02-13 NOTE — Plan of Care (Signed)
  Problem: Education: Goal: Knowledge of General Education information will improve Description: Including pain rating scale, medication(s)/side effects and non-pharmacologic comfort measures Outcome: Progressing   Problem: Clinical Measurements: Goal: Ability to maintain clinical measurements within normal limits will improve Outcome: Progressing Goal: Will remain free from infection Outcome: Progressing Goal: Diagnostic test results will improve Outcome: Progressing Goal: Respiratory complications will improve Outcome: Progressing Goal: Cardiovascular complication will be avoided Outcome: Progressing   Problem: Elimination: Goal: Will not experience complications related to bowel motility Outcome: Progressing Goal: Will not experience complications related to urinary retention Outcome: Progressing   Problem: Pain Management: Goal: General experience of comfort will improve Outcome: Progressing   Problem: Safety: Goal: Ability to remain free from injury will improve Outcome: Progressing

## 2023-02-13 NOTE — Progress Notes (Signed)
Progress Note   Patient: Adam Howell XBM:[id number] DOB: 1960/10/14 DOA: 02/09/2023     4 DOS: the patient was seen and examined on 02/13/2023   Brief hospital course: 62 y.o. male male w/ PMH ESRD on HD MWF, anemia chronic kidney disease, type 2 diabetes, chronic pain syndrome, chronic HFrEF, a/p R AKA, HLD, HTN, CAD.  Presents to the emergency department from home with C/L left leg pain and swelling x 3 to 4 days, foul odor to wound and leaking fluid for past few days.  Has been seeing wound care for this, recently treated outpatient by his PCP with doxycycline but wound has gotten worse.   Patient is seen by vascular team, advised ABI and if needed angiogram on Monday. He is on broad spectrum IV antibiotic therapy.  Assessment and Plan: Ulcer left lower extremity with infection. Patient does have draining, worsening pain. MRI left lower extremity negative for osteomyelitis. Vascular surgery team recommended left lower extremity ABI study which is within normal limits.  Continue  Linezolid, Cefepime and Flagyl therapy. Continue pain control.  ESRD on hemodialysis Scheduled for Monday, Wednesday and Friday. Nephrology on board for dialysis needs.  Chronic atrial fibrillation: Continue amiodarone, Eliquis therapy.  Chronic diastolic CHF No exacerbation. GDMT limited due to low blood pressures.  Chronic pain syndrome: Continue home dose opiates, Lyrica therapy. IV Dilaudid as needed will be weaned slowly.  Type 2 diabetes mellitus: A1c 5.9. Continue sliding scale insulin for hyperglycemia.  Chronic constipation: Due to chronic opiate use. Continue constipation regimen.  Obesity with BMI 32.5: Diet, exercise and weight reduction advised.  Active Pressure Injury/Wound(s)     Pressure Ulcer  Duration          Pressure Injury 03/19/22 Ischial tuberosity Left Stage 2 -  Partial thickness loss of dermis presenting as a shallow open injury with a red, pink wound bed  without slough. 329 days   Pressure Injury 03/19/22 Ischial tuberosity Right Stage 2 -  Partial thickness loss of dermis presenting as a shallow open injury with a red, pink wound bed without slough. 329 days           Out of bed to chair. Incentive spirometry. Nursing supportive care. Fall, aspiration precautions. DVT prophylaxis   Code Status: Full Code  Subjective: Patient is seen and examined today morning. He is lying in bed, pain better. But unable to bear weight, transfer to wheel chair.  Physical Exam: Vitals:   02/12/23 1558 02/12/23 2035 02/13/23 0421 02/13/23 0826  BP: (!) 103/55 (!) 110/94 113/69 128/61  Pulse: 73 77 66 66  Resp: 18 18 18 18   Temp: 98.5 F (36.9 C) 98.8 F (37.1 C) 97.6 F (36.4 C) 98.2 F (36.8 C)  TempSrc: Oral Oral Oral   SpO2: 100% 99% 98% 99%  Weight:      Height:        General - Elderly obese African American male, no apparent distress HEENT - PERRLA, EOMI, atraumatic head, non tender sinuses. Lung - Clear, diffuse rales, rhonchi, no wheezes. Heart - S1, S2 heard, no murmurs, rubs, 1+ pedal edema. Abdomen - Soft, non tender obese, bowel sounds good Neuro - sleeping, arousable, non focal exam. Skin - Warm and dry. Left arm fistula, Left leg dressing noted.  Data Reviewed:      Latest Ref Rng & Units 02/12/2023    8:23 AM 02/10/2023    5:18 AM 02/09/2023    9:44 AM  CBC  WBC 4.0 - 10.5 K/uL  6.5  6.3  7.1   Hemoglobin 13.0 - 17.0 g/dL 14.7  82.9  56.2   Hematocrit 39.0 - 52.0 % 34.8  38.8  44.1   Platelets 150 - 400 K/uL 237  222  250       Latest Ref Rng & Units 02/12/2023    8:23 AM 02/11/2023    5:20 AM 02/10/2023    5:18 AM  BMP  Glucose 70 - 99 mg/dL 130  83  84   BUN 8 - 23 mg/dL 63  48  56   Creatinine 0.61 - 1.24 mg/dL 86.57  84.69  62.95   Sodium 135 - 145 mmol/L 131  134  132   Potassium 3.5 - 5.1 mmol/L 4.7  4.5  4.2   Chloride 98 - 111 mmol/L 91  95  90   CO2 22 - 32 mmol/L 24  26  27    Calcium 8.9 - 10.3  mg/dL 7.7  7.9  8.3    US ARTERIAL ABI (SCREENING LOWER EXTREMITY)  Result Date: 02/13/2023 CLINICAL DATA:  62 year old male with a history of delayed wound healing EXAM: NONINVASIVE PHYSIOLOGIC VASCULAR STUDY OF UNILATERAL LOWER EXTREMITIES TECHNIQUE: Evaluation of left lower extremities were performed at rest, including calculation of ankle-brachial indices with single level Doppler, pressure and pulse volume recording. COMPARISON:  None FINDINGS: Right ABI:  N/a Left ABI:  1.08 Right Lower Extremity:  N/a Left Lower Extremity: Segmental Doppler at the left ankle demonstrates multiphasic waveforms IMPRESSION: Resting ABI of the left lower extremity within normal limits. Segmental exam at the ankle demonstrates multiphasic waveform Signed, Yvone Neu. Miachel Roux, RPVI [facility] Electronically Signed   By: Gilmer Mor D.O.   On: 02/13/2023 06:10     Family Communication: Discussed with patient, he understand and agree. All questions answereed.  Disposition: Status is: Inpatient Remains inpatient appropriate because: wound infection, IV antibiotics. Vascular follow up.  Planned Discharge Destination: Home with Home Health     Time spent: 38 minutes  Author: [name], MD 02/13/2023 12:47 PM Secure chat 7am to 7pm For on call review [email or web address].

## 2023-02-14 DIAGNOSIS — E1122 Type 2 diabetes mellitus with diabetic chronic kidney disease: Secondary | ICD-10-CM | POA: Diagnosis not present

## 2023-02-14 DIAGNOSIS — L97921 Non-pressure chronic ulcer of unspecified part of left lower leg limited to breakdown of skin: Secondary | ICD-10-CM | POA: Diagnosis not present

## 2023-02-14 DIAGNOSIS — N186 End stage renal disease: Secondary | ICD-10-CM | POA: Diagnosis not present

## 2023-02-14 DIAGNOSIS — T148XXA Other injury of unspecified body region, initial encounter: Secondary | ICD-10-CM | POA: Diagnosis not present

## 2023-02-14 LAB — CULTURE, BLOOD (ROUTINE X 2)
Culture: NO GROWTH
Culture: NO GROWTH
Special Requests: ADEQUATE

## 2023-02-14 NOTE — H&P (View-Only) (Signed)
Subjective  -   No complaints   Physical Exam:  Palpable right femoral pulse, nonpalpable left pedal pulses Left leg ulcer dressing intact      Assessment/Plan:   Nonhealing left leg wound in the setting of diabetes and arterial insufficiency: Most recent noninvasive imaging shows adequate blood flow, however given his limb threatening situation I feel angiography to formally define his anatomy and intervene if indicated is necessary.  He will be n.p.o. after midnight with plans for angiography tomorrow.  I discussed with the patient the details of the procedure and he wishes to proceed.  This will be through a right femoral approach.  Wells Coree Brame 02/14/2023 2:48 PM --  Vitals:   02/14/23 0421 02/14/23 0911  BP: 125/63 136/75  Pulse: (!) 57 67  Resp: 16 20  Temp: 98.3 F (36.8 C) 98.1 F (36.7 C)  SpO2: 100% 98%   No intake or output data in the 24 hours ending 02/14/23 1448   Laboratory CBC    Component Value Date/Time   WBC 6.5 02/12/2023 0823   HGB 11.7 (L) 02/12/2023 0823   HGB 16.8 01/12/2023 1414   HCT 34.8 (L) 02/12/2023 0823   HCT 51.0 01/12/2023 1414   PLT 237 02/12/2023 0823   PLT 257 01/12/2023 1414    BMET    Component Value Date/Time   NA 131 (L) 02/12/2023 0823   NA 135 08/11/2022 1336   K 4.7 02/12/2023 0823   CL 91 (L) 02/12/2023 0823   CO2 24 02/12/2023 0823   GLUCOSE 102 (H) 02/12/2023 0823   BUN 63 (H) 02/12/2023 0823   BUN 96 (HH) 08/11/2022 1336   CREATININE 12.36 (H) 02/12/2023 0823   CALCIUM 7.7 (L) 02/12/2023 0823   GFRNONAA 4 (L) 02/12/2023 0823    COAG Lab Results  Component Value Date   INR 1.2 03/05/2022   No results found for: "PTT"  Antibiotics Anti-infectives (From admission, onward)    Start     Dose/Rate Route Frequency Ordered Stop   02/12/23 1800  ceFEPIme (MAXIPIME) 1 g in sodium chloride 0.9 % 100 mL IVPB        1 g 200 mL/hr over 30 Minutes Intravenous Every 24 hours 02/11/23 0840     02/11/23  1000  linezolid (ZYVOX) tablet 600 mg        600 mg Oral Every 12 hours 02/11/23 0840     02/11/23 1000  ceFEPIme (MAXIPIME) 1 g in sodium chloride 0.9 % 100 mL IVPB        1 g 200 mL/hr over 30 Minutes Intravenous  Once 02/11/23 0840 02/11/23 2143   02/11/23 1000  metroNIDAZOLE (FLAGYL) tablet 500 mg        500 mg Oral Every 12 hours 02/11/23 0840     02/10/23 1200  vancomycin (VANCOCIN) IVPB 1000 mg/200 mL premix  Status:  Discontinued        1,000 mg 200 mL/hr over 60 Minutes Intravenous Every M-W-F (Hemodialysis) 02/09/23 1632 02/11/23 0839   02/09/23 2000  piperacillin-tazobactam (ZOSYN) IVPB 2.25 g  Status:  Discontinued        2.25 g 100 mL/hr over 30 Minutes Intravenous Every 8 hours 02/09/23 1648 02/11/23 0839   02/09/23 1245  vancomycin (VANCOREADY) IVPB 1500 mg/300 mL        1,500 mg 150 mL/hr over 120 Minutes Intravenous  Once 02/09/23 1240 02/09/23 1635   02/09/23 1239  vancomycin variable dose per unstable renal function (pharmacist dosing)  Status:  Discontinued         Does not apply See admin instructions 02/09/23 1240 02/09/23 1632   02/09/23 1230  vancomycin (VANCOCIN) IVPB 1000 mg/200 mL premix  Status:  Discontinued        1,000 mg 200 mL/hr over 60 Minutes Intravenous  Once 02/09/23 1221 02/09/23 1240   02/09/23 1045  piperacillin-tazobactam (ZOSYN) IVPB 2.25 g        2.25 g 100 mL/hr over 30 Minutes Intravenous  Once 02/09/23 1019 02/09/23 1211   02/09/23 1030  vancomycin (VANCOCIN) IVPB 1000 mg/200 mL premix        1,000 mg 200 mL/hr over 60 Minutes Intravenous  Once 02/09/23 1019 02/09/23 1415        V. Charlena Cross, M.D., [facility] [place] Office: [phone] Pager:  [phone]

## 2023-02-14 NOTE — Progress Notes (Signed)
Subjective  -   No complaints   Physical Exam:  Palpable right femoral pulse, nonpalpable left pedal pulses Left leg ulcer dressing intact      Assessment/Plan:   Nonhealing left leg wound in the setting of diabetes and arterial insufficiency: Most recent noninvasive imaging shows adequate blood flow, however given his limb threatening situation I feel angiography to formally define his anatomy and intervene if indicated is necessary.  He will be n.p.o. after midnight with plans for angiography tomorrow.  I discussed with the patient the details of the procedure and he wishes to proceed.  This will be through a right femoral approach.  Wells Coree Brame 02/14/2023 2:48 PM --  Vitals:   02/14/23 0421 02/14/23 0911  BP: 125/63 136/75  Pulse: (!) 57 67  Resp: 16 20  Temp: 98.3 F (36.8 C) 98.1 F (36.7 C)  SpO2: 100% 98%   No intake or output data in the 24 hours ending 02/14/23 1448   Laboratory CBC    Component Value Date/Time   WBC 6.5 02/12/2023 0823   HGB 11.7 (L) 02/12/2023 0823   HGB 16.8 01/12/2023 1414   HCT 34.8 (L) 02/12/2023 0823   HCT 51.0 01/12/2023 1414   PLT 237 02/12/2023 0823   PLT 257 01/12/2023 1414    BMET    Component Value Date/Time   NA 131 (L) 02/12/2023 0823   NA 135 08/11/2022 1336   K 4.7 02/12/2023 0823   CL 91 (L) 02/12/2023 0823   CO2 24 02/12/2023 0823   GLUCOSE 102 (H) 02/12/2023 0823   BUN 63 (H) 02/12/2023 0823   BUN 96 (HH) 08/11/2022 1336   CREATININE 12.36 (H) 02/12/2023 0823   CALCIUM 7.7 (L) 02/12/2023 0823   GFRNONAA 4 (L) 02/12/2023 0823    COAG Lab Results  Component Value Date   INR 1.2 03/05/2022   No results found for: "PTT"  Antibiotics Anti-infectives (From admission, onward)    Start     Dose/Rate Route Frequency Ordered Stop   02/12/23 1800  ceFEPIme (MAXIPIME) 1 g in sodium chloride 0.9 % 100 mL IVPB        1 g 200 mL/hr over 30 Minutes Intravenous Every 24 hours 02/11/23 0840     02/11/23  1000  linezolid (ZYVOX) tablet 600 mg        600 mg Oral Every 12 hours 02/11/23 0840     02/11/23 1000  ceFEPIme (MAXIPIME) 1 g in sodium chloride 0.9 % 100 mL IVPB        1 g 200 mL/hr over 30 Minutes Intravenous  Once 02/11/23 0840 02/11/23 2143   02/11/23 1000  metroNIDAZOLE (FLAGYL) tablet 500 mg        500 mg Oral Every 12 hours 02/11/23 0840     02/10/23 1200  vancomycin (VANCOCIN) IVPB 1000 mg/200 mL premix  Status:  Discontinued        1,000 mg 200 mL/hr over 60 Minutes Intravenous Every M-W-F (Hemodialysis) 02/09/23 1632 02/11/23 0839   02/09/23 2000  piperacillin-tazobactam (ZOSYN) IVPB 2.25 g  Status:  Discontinued        2.25 g 100 mL/hr over 30 Minutes Intravenous Every 8 hours 02/09/23 1648 02/11/23 0839   02/09/23 1245  vancomycin (VANCOREADY) IVPB 1500 mg/300 mL        1,500 mg 150 mL/hr over 120 Minutes Intravenous  Once 02/09/23 1240 02/09/23 1635   02/09/23 1239  vancomycin variable dose per unstable renal function (pharmacist dosing)  Status:  Discontinued         Does not apply See admin instructions 02/09/23 1240 02/09/23 1632   02/09/23 1230  vancomycin (VANCOCIN) IVPB 1000 mg/200 mL premix  Status:  Discontinued        1,000 mg 200 mL/hr over 60 Minutes Intravenous  Once 02/09/23 1221 02/09/23 1240   02/09/23 1045  piperacillin-tazobactam (ZOSYN) IVPB 2.25 g        2.25 g 100 mL/hr over 30 Minutes Intravenous  Once 02/09/23 1019 02/09/23 1211   02/09/23 1030  vancomycin (VANCOCIN) IVPB 1000 mg/200 mL premix        1,000 mg 200 mL/hr over 60 Minutes Intravenous  Once 02/09/23 1019 02/09/23 1415        V. Charlena Cross, M.D., [facility] [place] Office: [phone] Pager:  [phone]

## 2023-02-14 NOTE — Progress Notes (Signed)
Progress Note   Patient: Adam Howell UJW:[id number] DOB: Jun 10, 1960 DOA: 02/09/2023     5 DOS: the patient was seen and examined on 02/14/2023   Brief hospital course: 62 y.o. male male w/ PMH ESRD on HD MWF, anemia chronic kidney disease, type 2 diabetes, chronic pain syndrome, chronic HFrEF, a/p R AKA, HLD, HTN, CAD.  Presents to the emergency department from home with C/L left leg pain and swelling x 3 to 4 days, foul odor to wound and leaking fluid for past few days.  Has been seeing wound care for this, recently treated outpatient by his PCP with doxycycline but wound has gotten worse.   Patient is seen by vascular team, advised ABI and if needed angiogram on Monday. He is on broad spectrum IV antibiotic therapy. ABI this admission normal.   Assessment and Plan: Ulcer left lower extremity with infection. Patient does have draining, worsening pain. MRI left lower extremity negative for osteomyelitis. Vascular surgery team recommended left lower extremity ABI study which is within normal limits. Will need to see if he need dbridement of wound. Continue  Linezolid, Cefepime and Flagyl therapy. Continue pain control.  ESRD on hemodialysis Scheduled for Monday, Wednesday and Friday. Nephrology on board for dialysis needs.  Chronic atrial fibrillation: Continue amiodarone, Eliquis therapy.  Chronic diastolic CHF No exacerbation. GDMT limited due to low blood pressures.  Chronic pain syndrome: Continue home dose opiates, Lyrica therapy. IV Dilaudid as needed will be weaned slowly.  Type 2 diabetes mellitus: A1c 5.9. Continue sliding scale insulin for hyperglycemia.  Chronic constipation: Due to chronic opiate use. Continue constipation regimen.  Obesity with BMI 32.5: Diet, exercise and weight reduction advised.  Active Pressure Injury/Wound(s)     Pressure Ulcer  Duration          Pressure Injury 03/19/22 Ischial tuberosity Left Stage 2 -  Partial thickness  loss of dermis presenting as a shallow open injury with a red, pink wound bed without slough. 329 days   Pressure Injury 03/19/22 Ischial tuberosity Right Stage 2 -  Partial thickness loss of dermis presenting as a shallow open injury with a red, pink wound bed without slough. 329 days           Out of bed to chair. Incentive spirometry. Nursing supportive care. Fall, aspiration precautions. DVT prophylaxis   Code Status: Full Code  Subjective: Patient is seen and examined today morning. He is lying in bed, continues to have pain, unable to bear weight, and transfer to wheel chair.  Physical Exam: Vitals:   02/13/23 1609 02/13/23 2003 02/14/23 0421 02/14/23 0911  BP: 116/67 118/61 125/63 136/75  Pulse: (!) 53 (!) 57 (!) 57 67  Resp: 18 18 16 20   Temp: 97.9 F (36.6 C) 98.2 F (36.8 C) 98.3 F (36.8 C) 98.1 F (36.7 C)  TempSrc:  Oral Oral   SpO2: 100% 99% 100% 98%  Weight:      Height:        General - Elderly obese African American male, no apparent distress HEENT - PERRLA, EOMI, atraumatic head, non tender sinuses. Lung - Clear, diffuse rales, rhonchi, no wheezes. Heart - S1, S2 heard, no murmurs, rubs, 1+ pedal edema. Abdomen - Soft, non tender obese, bowel sounds good Neuro - sleeping, arousable, non focal exam. Skin - Warm and dry. Right AKA, Left arm fistula, Left leg dressing noted.  Data Reviewed:      Latest Ref Rng & Units 02/12/2023    8:23 AM 02/10/2023  5:18 AM 02/09/2023    9:44 AM  CBC  WBC 4.0 - 10.5 K/uL 6.5  6.3  7.1   Hemoglobin 13.0 - 17.0 g/dL 29.5  62.1  30.8   Hematocrit 39.0 - 52.0 % 34.8  38.8  44.1   Platelets 150 - 400 K/uL 237  222  250       Latest Ref Rng & Units 02/12/2023    8:23 AM 02/11/2023    5:20 AM 02/10/2023    5:18 AM  BMP  Glucose 70 - 99 mg/dL 657  83  84   BUN 8 - 23 mg/dL 63  48  56   Creatinine 0.61 - 1.24 mg/dL 84.69  62.95  28.41   Sodium 135 - 145 mmol/L 131  134  132   Potassium 3.5 - 5.1 mmol/L 4.7   4.5  4.2   Chloride 98 - 111 mmol/L 91  95  90   CO2 22 - 32 mmol/L 24  26  27    Calcium 8.9 - 10.3 mg/dL 7.7  7.9  8.3    US ARTERIAL ABI (SCREENING LOWER EXTREMITY)  Result Date: 02/13/2023 CLINICAL DATA:  62 year old male with a history of delayed wound healing EXAM: NONINVASIVE PHYSIOLOGIC VASCULAR STUDY OF UNILATERAL LOWER EXTREMITIES TECHNIQUE: Evaluation of left lower extremities were performed at rest, including calculation of ankle-brachial indices with single level Doppler, pressure and pulse volume recording. COMPARISON:  None FINDINGS: Right ABI:  N/a Left ABI:  1.08 Right Lower Extremity:  N/a Left Lower Extremity: Segmental Doppler at the left ankle demonstrates multiphasic waveforms IMPRESSION: Resting ABI of the left lower extremity within normal limits. Segmental exam at the ankle demonstrates multiphasic waveform Signed, Yvone Neu. Miachel Roux, RPVI [facility] Radiology Electronically Signed   By: Gilmer Mor D.O.   On: 02/13/2023 06:10     Family Communication: Discussed with patient, he understand and agree. All questions answereed.  Disposition: Status is: Inpatient Remains inpatient appropriate because: wound infection, IV antibiotics. Vascular follow up.  Planned Discharge Destination: Home with Home Health     Time spent: 36 minutes  Author: [name], MD 02/14/2023 12:47 PM Secure chat 7am to 7pm For on call review [email or web address].

## 2023-02-14 NOTE — Progress Notes (Signed)
[facility]  ROUNDING NOTE   Subjective:   Adam Howell is a 62 y.o male with past medical history of diabetes, hypertension, CAD, Rt AKA, anemia, CHF, and ESRD on dialysis. Patient presents to ED with left leg pain and swelling. He has been admitted for Wound infection [T14.8XXA, L08.9] Left leg cellulitis [L03.116] Infected ulcer of skin (HCC) [L98.499, L08.9]  Patient known to our practice and receives outpatient dialysis treatments at [facility] on a MWF schedule, supervised by [facility] physicians.   Patient seen laying in bed Alert Reports pain in left leg Denies nausea or vomiting    Objective:  Vital signs in last 24 hours:  Temp:  [97.9 F (36.6 C)-98.3 F (36.8 C)] 98.1 F (36.7 C) (12/01 0911) Pulse Rate:  [53-67] 67 (12/01 0911) Resp:  [16-20] 20 (12/01 0911) BP: (116-136)/(61-75) 136/75 (12/01 0911) SpO2:  [98 %-100 %] 98 % (12/01 0911)  Weight change:  Filed Weights   02/09/23 0916 02/12/23 0759 02/12/23 1252  Weight: 117.9 kg 102 kg 101.8 kg    Intake/Output: No intake/output data recorded.   Intake/Output this shift:  No intake/output data recorded.  Physical Exam: General: NAD  Head: Normocephalic, atraumatic. Moist oral mucosal membranes  Eyes: Anicteric  Lungs:  Clear to auscultation, normal effort  Heart: Regular rate and rhythm  Abdomen:  Soft, nontender  Extremities:  No peripheral edema.  Neurologic: Nonfocal, moving all four extremities  Skin: LLE wound, ace bandage  Access: Lt AVF    Basic Metabolic Panel: Recent Labs  Lab 02/09/23 0944 02/10/23 0518 02/11/23 0520 02/12/23 0823  NA 134* 132* 134* 131*  K 3.9 4.2 4.5 4.7  CL 92* 90* 95* 91*  CO2 29 27 26 24   GLUCOSE 127* 84 83 102*  BUN 47* 56* 48* 63*  CREATININE 10.09* 11.64* 10.32* 12.36*  CALCIUM 8.6* 8.3* 7.9* 7.7*  PHOS  --  6.1* 4.8* 5.6*    Liver Function Tests: Recent Labs  Lab 02/09/23 0918 02/11/23 0520 02/12/23 0823  AST 13*  --   --    ALT 8  --   --   ALKPHOS 67  --   --   BILITOT 0.4  --   --   PROT 7.8  --   --   ALBUMIN 3.3* 2.7* 2.6*   No results for input(s): "LIPASE", "AMYLASE" in the last 168 hours. No results for input(s): "AMMONIA" in the last 168 hours.  CBC: Recent Labs  Lab 02/09/23 0944 02/10/23 0518 02/12/23 0823  WBC 7.1 6.3 6.5  HGB 14.8 13.0 11.7*  HCT 44.1 38.8* 34.8*  MCV 84.8 86.2 86.8  PLT 250 222 237    Cardiac Enzymes: No results for input(s): "CKTOTAL", "CKMB", "CKMBINDEX", "TROPONINI" in the last 168 hours.  BNP: Invalid input(s): "POCBNP"  CBG: No results for input(s): "GLUCAP" in the last 168 hours.  Microbiology: Results for orders placed or performed during the hospital encounter of 02/09/23  Culture, blood (routine x 2)     Status: None   Collection Time: 02/09/23 10:30 AM   Specimen: BLOOD  Result Value Ref Range Status   Specimen Description BLOOD RIGHT [facility]  Final   Special Requests BOTTLES DRAWN AEROBIC AND ANAEROBIC  Final   Culture   Final    NO GROWTH 5 DAYS Performed at [facility], [address] [place], [place] [address]    Report Status 02/14/2023 FINAL  Final  Culture, blood (routine x 2)     Status:  None   Collection Time: 02/09/23  9:46 PM   Specimen: BLOOD  Result Value Ref Range Status   Specimen Description BLOOD RIGHT HAND  Final   Special Requests   Final    BOTTLES DRAWN AEROBIC AND ANAEROBIC Blood Culture adequate volume   Culture   Final    NO GROWTH 5 DAYS Performed at [facility], [address], [place], [place] [address]    Report Status 02/14/2023 FINAL  Final    Coagulation Studies: No results for input(s): "LABPROT", "INR" in the last 72 hours.  Urinalysis: No results for input(s): "COLORURINE", "LABSPEC", "PHURINE", "GLUCOSEU", "HGBUR", "BILIRUBINUR", "KETONESUR", "PROTEINUR", "UROBILINOGEN", "NITRITE", "LEUKOCYTESUR" in the last 72 hours.  Invalid input(s): "APPERANCEUR"    Imaging: US ARTERIAL  ABI (SCREENING LOWER EXTREMITY)  Result Date: 02/13/2023 CLINICAL DATA:  63 year old male with a history of delayed wound healing EXAM: NONINVASIVE PHYSIOLOGIC VASCULAR STUDY OF UNILATERAL LOWER EXTREMITIES TECHNIQUE: Evaluation of left lower extremities were performed at rest, including calculation of ankle-brachial indices with single level Doppler, pressure and pulse volume recording. COMPARISON:  None FINDINGS: Right ABI:  N/a Left ABI:  1.08 Right Lower Extremity:  N/a Left Lower Extremity: Segmental Doppler at the left ankle demonstrates multiphasic waveforms IMPRESSION: Resting ABI of the left lower extremity within normal limits. Segmental exam at the ankle demonstrates multiphasic waveform Signed, Yvone Neu. Miachel Roux, RPVI [facility] Radiology Electronically Signed   By: Gilmer Mor D.O.   On: 02/13/2023 06:10     Medications:    ceFEPime (MAXIPIME) IV 1 g (02/13/23 1848)    amiodarone  200 mg Oral BID   apixaban  2.5 mg Oral BID   calcitRIOL  1.5 mcg Oral Q M,W,F-1800   Chlorhexidine Gluconate Cloth  6 each Topical Q0600   cinacalcet  30 mg Oral Q breakfast   docusate sodium  100 mg Oral BID   leptospermum manuka honey  1 Application Topical Daily   linezolid  600 mg Oral Q12H   melatonin  2.5 mg Oral QHS   metroNIDAZOLE  500 mg Oral Q12H   multivitamin  1 tablet Oral QHS   pregabalin  25 mg Oral Daily   rosuvastatin  40 mg Oral Daily   senna  2 tablet Oral QHS   sevelamer carbonate  2,400 mg Oral TID WC   acetaminophen **OR** acetaminophen, hydrALAZINE, HYDROcodone-acetaminophen, HYDROmorphone (DILAUDID) injection, LORazepam, ondansetron **OR** ondansetron (ZOFRAN) IV  Assessment/ Plan:  Adam Howell is a 62 y.o.  male with past medical history of diabetes, hypertension, CAD, Rt AKA, anemia, CHF, and ESRD on dialysis. Patient presents to ED with left leg pain and swelling. He has been admitted for Wound  infection [T14.8XXA, L08.9] Left leg cellulitis [L03.116] Infected ulcer of skin (HCC) [L98.499, L08.9]  [facility] [place]/MWF/Lt AVF  End stage renal disease on hemodialysis.  Pending scheduled procedure, may defer dialysis treatment until Tuesday.   2. Anemia of chronic kidney disease Lab Results  Component Value Date   HGB 11.7 (L) 02/12/2023    Hemoglobin within optimal range.  3. Wound infection, Left leg. Imaging negative for osteomyelitis. Receiving cefepime, Flagyl, linezolid.  Vascular ABI study completed, awaiting vascular plan for angiogram.   4. Secondary Hyperparathyroidism: with outpatient labs: PTH 558, phosphorus 7.0, calcium 8.2 on 01/20/23.   Lab Results  Component Value Date   CALCIUM 7.7 (L) 02/12/2023   PHOS 5.6 (H) 02/12/2023    Prescribed calcitriol, cinacalcet and sevelamer outpatient.  Receiving these medications along with sevelamer with meals.    LOS: 5 Adam Howell 12/1/202412:23 PM

## 2023-02-14 NOTE — Plan of Care (Signed)
  Problem: Health Behavior/Discharge Planning: Goal: Ability to manage health-related needs will improve Outcome: Progressing   Problem: Clinical Measurements: Goal: Ability to maintain clinical measurements within normal limits will improve Outcome: Progressing Goal: Will remain free from infection Outcome: Progressing Goal: Diagnostic test results will improve Outcome: Progressing Goal: Respiratory complications will improve Outcome: Progressing Goal: Cardiovascular complication will be avoided Outcome: Progressing   Problem: Clinical Measurements: Goal: Will remain free from infection Outcome: Progressing   Problem: Activity: Goal: Risk for activity intolerance will decrease Outcome: Progressing   Problem: Nutrition: Goal: Adequate nutrition will be maintained Outcome: Progressing   Problem: Coping: Goal: Level of anxiety will decrease Outcome: Progressing   Problem: Elimination: Goal: Will not experience complications related to bowel motility Outcome: Progressing Goal: Will not experience complications related to urinary retention Outcome: Progressing

## 2023-02-15 ENCOUNTER — Encounter
Admission: EM | Disposition: A | Discharge status: Skilled Nursing Facility | Payer: Self-pay | Source: Home / Self Care | Attending: Internal Medicine

## 2023-02-15 DIAGNOSIS — E1122 Type 2 diabetes mellitus with diabetic chronic kidney disease: Secondary | ICD-10-CM | POA: Diagnosis not present

## 2023-02-15 DIAGNOSIS — N185 Chronic kidney disease, stage 5: Secondary | ICD-10-CM

## 2023-02-15 DIAGNOSIS — T148XXA Other injury of unspecified body region, initial encounter: Secondary | ICD-10-CM | POA: Diagnosis not present

## 2023-02-15 DIAGNOSIS — L97921 Non-pressure chronic ulcer of unspecified part of left lower leg limited to breakdown of skin: Secondary | ICD-10-CM | POA: Diagnosis not present

## 2023-02-15 DIAGNOSIS — I482 Chronic atrial fibrillation, unspecified: Secondary | ICD-10-CM | POA: Diagnosis not present

## 2023-02-15 HISTORY — PX: LOWER EXTREMITY ANGIOGRAPHY: CATH118251

## 2023-02-15 LAB — GLUCOSE, CAPILLARY: Glucose-Capillary: 74 mg/dL (ref 70–99)

## 2023-02-15 SURGERY — ABDOMINAL AORTOGRAM W/LOWER EXTREMITY
Anesthesia: IV Sedation (MBSC Only)

## 2023-02-15 SURGERY — LOWER EXTREMITY ANGIOGRAPHY
Anesthesia: Moderate Sedation | Laterality: Left

## 2023-02-15 MED ORDER — SODIUM CHLORIDE 0.9 % IV SOLN: INTRAVENOUS | Status: DC

## 2023-02-15 MED ORDER — CEFAZOLIN SODIUM-DEXTROSE 1-4 GM/50ML-% IV SOLN
1.0000 g | INTRAVENOUS | Status: AC
  Administered 2023-02-15: 1 g via INTRAVENOUS

## 2023-02-15 MED ORDER — CEFAZOLIN SODIUM-DEXTROSE 1-4 GM/50ML-% IV SOLN
INTRAVENOUS | Status: AC
  Filled 2023-02-15: qty 50

## 2023-02-15 MED ORDER — HEPARIN SODIUM (PORCINE) 1000 UNIT/ML IJ SOLN
INTRAMUSCULAR | Status: AC
  Filled 2023-02-15: qty 10

## 2023-02-15 MED ORDER — HEPARIN (PORCINE) IN NACL 1000-0.9 UT/500ML-% IV SOLN
INTRAVENOUS | Status: DC | PRN
  Administered 2023-02-15: 1000 mL

## 2023-02-15 MED ORDER — MIDAZOLAM HCL 2 MG/ML PO SYRP
8.0000 mg | ORAL_SOLUTION | Freq: Once | ORAL | Status: DC | PRN
  Filled 2023-02-15: qty 5

## 2023-02-15 MED ORDER — LIDOCAINE-EPINEPHRINE (PF) 1 %-1:200000 IJ SOLN
INTRAMUSCULAR | Status: DC | PRN
  Administered 2023-02-15: 10 mL

## 2023-02-15 MED ORDER — FENTANYL CITRATE (PF) 100 MCG/2ML IJ SOLN
INTRAMUSCULAR | Status: AC
  Filled 2023-02-15: qty 2

## 2023-02-15 MED ORDER — FENTANYL CITRATE PF 50 MCG/ML IJ SOSY: 12.5000 ug | PREFILLED_SYRINGE | Freq: Once | INTRAMUSCULAR | Status: DC | PRN

## 2023-02-15 MED ORDER — HEPARIN SODIUM (PORCINE) 1000 UNIT/ML IJ SOLN
INTRAMUSCULAR | Status: DC | PRN
  Administered 2023-02-15: 5000 [IU] via INTRAVENOUS

## 2023-02-15 MED ORDER — MIDAZOLAM HCL 5 MG/5ML IJ SOLN
INTRAMUSCULAR | Status: AC
  Filled 2023-02-15: qty 5

## 2023-02-15 MED ORDER — DIPHENHYDRAMINE HCL 50 MG/ML IJ SOLN: 50.0000 mg | Freq: Once | INTRAMUSCULAR | Status: DC | PRN

## 2023-02-15 MED ORDER — MIDAZOLAM HCL 2 MG/2ML IJ SOLN
INTRAMUSCULAR | Status: DC | PRN
  Administered 2023-02-15: 2 mg via INTRAVENOUS

## 2023-02-15 MED ORDER — ASPIRIN 81 MG PO TBEC
81.0000 mg | DELAYED_RELEASE_TABLET | Freq: Every day | ORAL | Status: DC
  Administered 2023-02-16 – 2023-03-04 (×17): 81 mg via ORAL
  Filled 2023-02-15 (×17): qty 1

## 2023-02-15 MED ORDER — METHYLPREDNISOLONE SODIUM SUCC 125 MG IJ SOLR: 125.0000 mg | Freq: Once | INTRAMUSCULAR | Status: DC | PRN

## 2023-02-15 MED ORDER — IODIXANOL 320 MG/ML IV SOLN
INTRAVENOUS | Status: DC | PRN
  Administered 2023-02-15: 45 mL

## 2023-02-15 MED ORDER — FENTANYL CITRATE (PF) 100 MCG/2ML IJ SOLN
INTRAMUSCULAR | Status: DC | PRN
  Administered 2023-02-15: 50 ug via INTRAVENOUS

## 2023-02-15 MED ORDER — FAMOTIDINE 20 MG PO TABS: 40.0000 mg | ORAL_TABLET | Freq: Once | ORAL | Status: DC | PRN

## 2023-02-15 SURGICAL SUPPLY — 21 items
BALLN ARMADA 2X100X150 (BALLOONS) ×1
BALLN ULTRVRSE 2.5X300X150 (BALLOONS) ×1
BALLN ULTRVRSE 2X100X150 (BALLOONS) ×1
BALLN ULTRVRSE 3X100X150 (BALLOONS) ×1
BALLOON ARMADA 2X100X150 (BALLOONS) IMPLANT
BALLOON ULTRVRSE 2.5X300X150 (BALLOONS) IMPLANT
BALLOON ULTRVRSE 2X100X150 (BALLOONS) IMPLANT
BALLOON ULTRVRSE 3X100X150 (BALLOONS) IMPLANT
CATH ANGIO 5F PIGTAIL 65CM (CATHETERS) IMPLANT
CATH VERT 5X100 (CATHETERS) IMPLANT
COVER PROBE ULTRASOUND 5X96 (MISCELLANEOUS) IMPLANT
DEVICE PRESTO INFLATION (MISCELLANEOUS) IMPLANT
DEVICE STARCLOSE SE CLOSURE (Vascular Products) IMPLANT
GLIDEWIRE ADV .035X260CM (WIRE) IMPLANT
PACK ANGIOGRAPHY (CUSTOM PROCEDURE TRAY) ×1 IMPLANT
SHEATH BRITE TIP 5FRX11 (SHEATH) IMPLANT
SHEATH RAABE 6FRX70 (SHEATH) IMPLANT
SYR MEDRAD MARK 7 150ML (SYRINGE) IMPLANT
TUBING CONTRAST HIGH PRESS 72 (TUBING) IMPLANT
WIRE G V18X300CM (WIRE) IMPLANT
WIRE GUIDERIGHT .035X150 (WIRE) IMPLANT

## 2023-02-15 NOTE — Progress Notes (Signed)
[facility]  ROUNDING NOTE   Subjective:   Adam Howell is a 62 y.o male with past medical history of diabetes, hypertension, CAD, Rt AKA, anemia, CHF, and ESRD on dialysis. Patient presents to ED with left leg pain and swelling. He has been admitted for Wound infection [T14.8XXA, L08.9] Left leg cellulitis [L03.116] Infected ulcer of skin (HCC) [L98.499, L08.9]  Patient known to our practice and receives outpatient dialysis treatments at [facility] on a MWF schedule, supervised by [facility] physicians.   Patient seen laying in bed Alert NPO for vascular procedure Pain in left lower leg   Objective:  Vital signs in last 24 hours:  Temp:  [97.5 F (36.4 C)-98.4 F (36.9 C)] 97.5 F (36.4 C) (12/02 0938) Pulse Rate:  [63-79] 79 (12/02 0938) Resp:  [18-20] 19 (12/02 0938) BP: (122-159)/(70-83) 159/83 (12/02 0938) SpO2:  [99 %-100 %] 99 % (12/02 0938)  Weight change:  Filed Weights   02/09/23 0916 02/12/23 0759 02/12/23 1252  Weight: 117.9 kg 102 kg 101.8 kg    Intake/Output: No intake/output data recorded.   Intake/Output this shift:  No intake/output data recorded.  Physical Exam: General: NAD  Head: Normocephalic, atraumatic. Moist oral mucosal membranes  Eyes: Anicteric  Lungs:  Clear to auscultation, normal effort  Heart: Regular rate and rhythm  Abdomen:  Soft, nontender  Extremities:  No peripheral edema.  Neurologic: Nonfocal, moving all four extremities  Skin: LLE wound, ace bandage  Access: Lt AVF    Basic Metabolic Panel: Recent Labs  Lab 02/09/23 0944 02/10/23 0518 02/11/23 0520 02/12/23 0823  NA 134* 132* 134* 131*  K 3.9 4.2 4.5 4.7  CL 92* 90* 95* 91*  CO2 29 27 26 24   GLUCOSE 127* 84 83 102*  BUN 47* 56* 48* 63*  CREATININE 10.09* 11.64* 10.32* 12.36*  CALCIUM 8.6* 8.3* 7.9* 7.7*  PHOS  --  6.1* 4.8* 5.6*    Liver Function Tests: Recent Labs  Lab 02/09/23 0918 02/11/23 0520 02/12/23 0823  AST 13*  --   --    ALT 8  --   --   ALKPHOS 67  --   --   BILITOT 0.4  --   --   PROT 7.8  --   --   ALBUMIN 3.3* 2.7* 2.6*   No results for input(s): "LIPASE", "AMYLASE" in the last 168 hours. No results for input(s): "AMMONIA" in the last 168 hours.  CBC: Recent Labs  Lab 02/09/23 0944 02/10/23 0518 02/12/23 0823  WBC 7.1 6.3 6.5  HGB 14.8 13.0 11.7*  HCT 44.1 38.8* 34.8*  MCV 84.8 86.2 86.8  PLT 250 222 237    Cardiac Enzymes: No results for input(s): "CKTOTAL", "CKMB", "CKMBINDEX", "TROPONINI" in the last 168 hours.  BNP: Invalid input(s): "POCBNP"  CBG: Recent Labs  Lab 02/15/23 0935  GLUCAP 74    Microbiology: Results for orders placed or performed during the hospital encounter of 02/09/23  Culture, blood (routine x 2)     Status: None   Collection Time: 02/09/23 10:30 AM   Specimen: BLOOD  Result Value Ref Range Status   Specimen Description BLOOD RIGHT [facility]  Final   Special Requests BOTTLES DRAWN AEROBIC AND ANAEROBIC  Final   Culture   Final    NO GROWTH 5 DAYS Performed at [facility], [address] [place], [place] [address]    Report Status 02/14/2023 FINAL  Final  Culture, blood (routine x 2)     Status:  None   Collection Time: 02/09/23  9:46 PM   Specimen: BLOOD  Result Value Ref Range Status   Specimen Description BLOOD RIGHT HAND  Final   Special Requests   Final    BOTTLES DRAWN AEROBIC AND ANAEROBIC Blood Culture adequate volume   Culture   Final    NO GROWTH 5 DAYS Performed at [facility], [address], [place], [place] [address]    Report Status 02/14/2023 FINAL  Final    Coagulation Studies: No results for input(s): "LABPROT", "INR" in the last 72 hours.  Urinalysis: No results for input(s): "COLORURINE", "LABSPEC", "PHURINE", "GLUCOSEU", "HGBUR", "BILIRUBINUR", "KETONESUR", "PROTEINUR", "UROBILINOGEN", "NITRITE", "LEUKOCYTESUR" in the last 72 hours.  Invalid input(s): "APPERANCEUR"    Imaging: No results  found.   Medications:    ceFEPime (MAXIPIME) IV 1 g (02/14/23 1728)    amiodarone  200 mg Oral BID   apixaban  2.5 mg Oral BID   calcitRIOL  1.5 mcg Oral Q M,W,F-1800   Chlorhexidine Gluconate Cloth  6 each Topical Q0600   cinacalcet  30 mg Oral Q breakfast   docusate sodium  100 mg Oral BID   leptospermum manuka honey  1 Application Topical Daily   linezolid  600 mg Oral Q12H   melatonin  2.5 mg Oral QHS   metroNIDAZOLE  500 mg Oral Q12H   multivitamin  1 tablet Oral QHS   pregabalin  25 mg Oral Daily   rosuvastatin  40 mg Oral Daily   senna  2 tablet Oral QHS   sevelamer carbonate  2,400 mg Oral TID WC   acetaminophen **OR** acetaminophen, hydrALAZINE, HYDROcodone-acetaminophen, HYDROmorphone (DILAUDID) injection, LORazepam, ondansetron **OR** ondansetron (ZOFRAN) IV  Assessment/ Plan:  [name] is a 62 y.o.  male with past medical history of diabetes, hypertension, CAD, Rt AKA, anemia, CHF, and ESRD on dialysis. Patient presents to ED with left leg pain and swelling. He has been admitted for Wound infection [T14.8XXA, L08.9] Left leg cellulitis [L03.116] Infected ulcer of skin (HCC) [L98.499, L08.9]  [facility]/MWF/Lt AVF  End stage renal disease on hemodialysis.  Due to scheduled procedure, we will provide dialysis tomorrow.   2. Anemia of chronic kidney disease Lab Results  Component Value Date   HGB 11.7 (L) 02/12/2023    Hemoglobin at goal. No need for ESA.   3. Wound infection, Left leg. Imaging negative for osteomyelitis. Receiving cefepime, Flagyl, linezolid.  Vascular ABI study normal. Vascular surgery to proceed with angiogram later today of left lower extremity.   4. Secondary Hyperparathyroidism: with outpatient labs: PTH 558, phosphorus 7.0, calcium 8.2 on 01/20/23.   Lab Results  Component Value Date   CALCIUM 7.7 (L) 02/12/2023   PHOS 5.6 (H) 02/12/2023    Prescribed calcitriol, cinacalcet and sevelamer outpatient.  Receiving  these medications along with sevelamer with meals. Will continue to monitor bone minerals.    LOS: 6 Bo Teicher 12/2/202410:50 AM

## 2023-02-15 NOTE — Progress Notes (Signed)
Patient received from the cath lab, right groin access site clean dry and intact. Patient A&O. Pulse to LLE thready but palpable. R BKA. Room air.  [name] Kerrilyn Azbill

## 2023-02-15 NOTE — Interval H&P Note (Signed)
History and Physical Interval Note:  02/15/2023 12:06 PM  Adam Howell  has presented today for surgery, with the diagnosis of Non healing wound.  The various methods of treatment have been discussed with the patient and family. After consideration of risks, benefits and other options for treatment, the patient has consented to  Procedure(s): Lower Extremity Angiography (Left) as a surgical intervention.  The patient's history has been reviewed, patient examined, no change in status, stable for surgery.  I have reviewed the patient's chart and labs.  Questions were answered to the patient's satisfaction.     Festus Barren

## 2023-02-15 NOTE — Progress Notes (Signed)
Progress Note   Patient: Adam Howell WGN:[id number] DOB: 1960-08-24 DOA: 02/09/2023     6 DOS: the patient was seen and examined on 02/15/2023   Brief hospital course: 62 y.o. male male w/ PMH ESRD on HD MWF, anemia chronic kidney disease, type 2 diabetes, chronic pain syndrome, chronic HFrEF, a/p R AKA, HLD, HTN, CAD.  Presents to the emergency department from home with C/L left leg pain and swelling x 3 to 4 days, foul odor to wound and leaking fluid for past few days.  Has been seeing wound care for this, recently treated outpatient by his PCP with doxycycline but wound has gotten worse.   Patient is seen by vascular team, advised ABI and if needed angiogram on Monday. He is on broad spectrum IV antibiotic therapy. ABI this admission normal.   Assessment and Plan: Ulcer left lower extremity with infection. Patient does have draining, worsening pain. MRI left lower extremity negative for osteomyelitis. Cultures 12/24/22 - Staph, enterococcus, enterobacter. Continue  Linezolid, Cefepime and Flagyl therapy. Will consult ID regarding antibiotic therapy. Vascular surgery team recommended left lower extremity ABI study which is within normal limits. Patient to go to vascular lab today for angio.  ESRD on hemodialysis Scheduled for Monday, Wednesday and Friday. Nephrology on board for dialysis needs.  Chronic atrial fibrillation: Continue amiodarone, Eliquis therapy.  Chronic diastolic CHF No exacerbation. GDMT limited due to low blood pressures.  Chronic pain syndrome: Continue home dose opiates, Lyrica therapy. IV Dilaudid as needed will be weaned slowly.  Type 2 diabetes mellitus: A1c 5.9. Continue sliding scale insulin for hyperglycemia.  Chronic constipation: Due to chronic opiate use. Continue constipation regimen.  Obesity with BMI 32.5: Diet, exercise and weight reduction advised.  Active Pressure Injury/Wound(s)     Pressure Ulcer  Duration           Pressure Injury 03/19/22 Ischial tuberosity Left Stage 2 -  Partial thickness loss of dermis presenting as a shallow open injury with a red, pink wound bed without slough. 329 days   Pressure Injury 03/19/22 Ischial tuberosity Right Stage 2 -  Partial thickness loss of dermis presenting as a shallow open injury with a red, pink wound bed without slough. 329 days           Out of bed to chair. Incentive spirometry. Nursing supportive care. Fall, aspiration precautions. DVT prophylaxis   Code Status: Full Code  Subjective: Patient is seen and examined today morning. He is lying in bed, awaiting vascular procedure. Did not get out of bed due to pain. Eating fair.  Physical Exam: Vitals:   02/15/23 1400 02/15/23 1415 02/15/23 1430 02/15/23 1445  BP: 130/65 128/71 115/72 117/75  Pulse: 75 72 70 70  Resp: 18 20 18 18   Temp:      TempSrc:      SpO2: 91% 92% 93% 96%  Weight:      Height:        General - Elderly obese African American male, no apparent distress HEENT - PERRLA, EOMI, atraumatic head, non tender sinuses. Lung - Clear, diffuse rales, rhonchi, no wheezes. Heart - S1, S2 heard, no murmurs, rubs, 1+ pedal edema. Abdomen - Soft, non tender obese, bowel sounds good Neuro - sleeping, arousable, non focal exam. Skin - Warm and dry. Right AKA, Left arm fistula, Left leg dressing noted.  Data Reviewed:      Latest Ref Rng & Units 02/12/2023    8:23 AM 02/10/2023    5:18 AM 02/09/2023  9:44 AM  CBC  WBC 4.0 - 10.5 K/uL 6.5  6.3  7.1   Hemoglobin 13.0 - 17.0 g/dL 10.2  72.5  36.6   Hematocrit 39.0 - 52.0 % 34.8  38.8  44.1   Platelets 150 - 400 K/uL 237  222  250       Latest Ref Rng & Units 02/12/2023    8:23 AM 02/11/2023    5:20 AM 02/10/2023    5:18 AM  BMP  Glucose 70 - 99 mg/dL 440  83  84   BUN 8 - 23 mg/dL 63  48  56   Creatinine 0.61 - 1.24 mg/dL 34.74  25.95  63.87   Sodium 135 - 145 mmol/L 131  134  132   Potassium 3.5 - 5.1 mmol/L 4.7  4.5  4.2    Chloride 98 - 111 mmol/L 91  95  90   CO2 22 - 32 mmol/L 24  26  27    Calcium 8.9 - 10.3 mg/dL 7.7  7.9  8.3    PERIPHERAL VASCULAR CATHETERIZATION  Result Date: 02/15/2023 See surgical note for result.    Family Communication: Discussed with patient, he understand and agree. All questions answereed.  Disposition: Status is: Inpatient Remains inpatient appropriate because: wound infection, IV antibiotics. Vascular follow up.  Planned Discharge Destination: Home with Home Health     Time spent: 37 minutes  Author: [name], MD 02/15/2023 3:09 PM Secure chat 7am to 7pm For on call review [email or web address].

## 2023-02-15 NOTE — Op Note (Signed)
[facility]  Percutaneous Study/Intervention Procedural Note   Date of Surgery: 02/15/2023  Surgeon(s):Ilda Laskin    Assistants:none  Pre-operative Diagnosis: PAD with ulceration LLE  Post-operative diagnosis:  Same  Procedure(s) Performed:             1.  Ultrasound guidance for vascular access right femoral artery             2.  Catheter placement into left SFA from right femoral approach             3.  Aortogram and selective left lower extremity angiogram             4.  Percutaneous transluminal angioplasty of left posterior tibial artery with 3 mm diameter by 10 cm length angioplasty balloon             5.  Percutaneous transluminal angioplasty of left peroneal artery with 2.5 mm diameter by 30 cm length angioplasty balloon and 2 mm diameter by 10 cm length angioplasty balloon  6.  StarClose closure device right femoral artery  EBL: 5 cc  Contrast: 45 cc  Fluoro Time: 4.1 minutes  Moderate Conscious Sedation Time: approximately 31 minutes using 2 mg of Versed and 50 mcg of Fentanyl              Indications:  Patient is a 62 y.o.male with nonhealing ulcerations on the left foot in a patient who is already lost his right leg. The patient is brought in for angiography for further evaluation and potential treatment.  Due to the limb threatening nature of the situation, angiogram was performed for attempted limb salvage. The patient is aware that if the procedure fails, amputation would be expected.  The patient also understands that even with successful revascularization, amputation may still be required due to the severity of the situation.  Risks and benefits are discussed and informed consent is obtained.   Procedure:  The patient was identified and appropriate procedural time out was performed.  The patient was then placed supine on the table and prepped and draped in the usual sterile fashion. Moderate conscious sedation was administered during a face  to face encounter with the patient throughout the procedure with my supervision of the RN administering medicines and monitoring the patient's vital signs, pulse oximetry, telemetry and mental status throughout from the start of the procedure until the patient was taken to the recovery room. Ultrasound was used to evaluate the right common femoral artery.  It was patent .  A digital ultrasound image was acquired.  A Seldinger needle was used to access the right common femoral artery under direct ultrasound guidance and a permanent image was performed.  A 0.035 J wire was advanced without resistance and a 5Fr sheath was placed.  Pigtail catheter was placed into the aorta and an AP aortogram was performed. This demonstrated normal renal arteries and normal aorta and iliac segments without significant stenosis. I then crossed the aortic bifurcation and advanced to the left femoral head and then into the proximal left SFA. Selective left lower extremity angiogram was then performed. This demonstrated fairly normal common femoral artery, profunda femoris artery, and proximal left SFA.  The remainder of the SFA and popliteal artery had several areas of mild disease but nothing that appeared to be more than 30 to 40%.  There was a typical tibial trifurcation and the posterior tibial artery was the dominant runoff to the foot.  It had 2 areas of moderate stenosis  in the 60% range 1 at the origin and 1 about 6 to 7 cm beyond the origin of the posterior tibial artery.  The remainder of the vessel was widely patent.  The peroneal artery was diffusely diseased and had multiple areas of occlusion including occlusion distally.  The anterior tibial artery occluded proximally and did not reconstitute distally. It was felt that it was in the patient's best interest to proceed with intervention after these images to avoid a second procedure and a larger amount of contrast and fluoroscopy based off of the findings from the initial  angiogram. The patient was systemically heparinized and a 6 French 70 cm sheath was then placed over the Terumo Advantage wire. I then used a Kumpe catheter and the advantage wire to navigate down into the proximal posterior tibial artery and then exchanged for a V18 wire which I then parked in the foot.  I began by performing angioplasty of the posterior tibial artery with a 3 mm diameter by 10 cm length angioplasty balloon inflated to 8 atm for 1 minute.  Completion imaging showed marked improvement with less than 20% residual stenosis.  I then redirected down into the peroneal artery.  Although I could not get a wire all the way to the ankle and the peroneal artery, when I attempted to treat the entirety of the vessel with a 2.5 mm diameter by 30 cm length angioplasty balloon it would not get beyond the mid to distal peroneal artery.  It was inflated up to 8 atm for 1 minute to treat the proximal to mid peroneal artery.  A 2 mm diameter by 10 cm length angioplasty balloon was then used in the mid to distal peroneal artery and taken up to 12 atm for 1 minute.  I was able to get across the distal peroneal artery with any size balloon but the areas treated all look markedly improved.  The posterior tibial artery still remained the dominant runoff distally. I elected to terminate the procedure. The sheath was removed and StarClose closure device was deployed in the right femoral artery with excellent hemostatic result. The patient was taken to the recovery room in stable condition having tolerated the procedure well.  Findings:               Aortogram:  This demonstrated normal renal arteries and normal aorta and iliac segments without significant stenosis.             Left Lower Extremity:  This demonstrated fairly normal common femoral artery, profunda femoris artery, and proximal left SFA.  The remainder of the SFA and popliteal artery had several areas of mild disease but nothing that appeared to be more than  30 to 40%.  There was a typical tibial trifurcation and the posterior tibial artery was the dominant runoff to the foot.  It had 2 areas of moderate stenosis in the 60% range 1 at the origin and 1 about 6 to 7 cm beyond the origin of the posterior tibial artery.  The remainder of the vessel was widely patent.  The peroneal artery was diffusely diseased and had multiple areas of occlusion including occlusion distally.  The anterior tibial artery occluded proximally and did not reconstitute distally.   Disposition: Patient was taken to the recovery room in stable condition having tolerated the procedure well.  Complications: None  Adam Howell 02/15/2023 1:00 PM   This note was created with Dragon Medical transcription system. Any errors in dictation are purely unintentional.

## 2023-02-16 ENCOUNTER — Encounter: Payer: Self-pay | Admitting: *Deleted

## 2023-02-16 ENCOUNTER — Encounter: Payer: Self-pay | Admitting: Vascular Surgery

## 2023-02-16 ENCOUNTER — Telehealth: Payer: Self-pay | Admitting: Nurse Practitioner

## 2023-02-16 DIAGNOSIS — L97921 Non-pressure chronic ulcer of unspecified part of left lower leg limited to breakdown of skin: Secondary | ICD-10-CM | POA: Diagnosis not present

## 2023-02-16 DIAGNOSIS — Z89611 Acquired absence of right leg above knee: Secondary | ICD-10-CM | POA: Diagnosis not present

## 2023-02-16 DIAGNOSIS — T148XXA Other injury of unspecified body region, initial encounter: Secondary | ICD-10-CM | POA: Diagnosis not present

## 2023-02-16 DIAGNOSIS — N186 End stage renal disease: Secondary | ICD-10-CM | POA: Diagnosis not present

## 2023-02-16 DIAGNOSIS — E1122 Type 2 diabetes mellitus with diabetic chronic kidney disease: Secondary | ICD-10-CM | POA: Diagnosis not present

## 2023-02-16 LAB — CBC
HCT: 35.1 % — ABNORMAL LOW (ref 39.0–52.0)
Hemoglobin: 11.8 g/dL — ABNORMAL LOW (ref 13.0–17.0)
MCH: 28.6 pg (ref 26.0–34.0)
MCHC: 33.6 g/dL (ref 30.0–36.0)
MCV: 85.2 fL (ref 80.0–100.0)
Platelets: 253 10*3/uL (ref 150–400)
RBC: 4.12 MIL/uL — ABNORMAL LOW (ref 4.22–5.81)
RDW: 15.2 % (ref 11.5–15.5)
WBC: 7.7 10*3/uL (ref 4.0–10.5)
nRBC: 0 % (ref 0.0–0.2)

## 2023-02-16 LAB — BASIC METABOLIC PANEL
Anion gap: 14 (ref 5–15)
BUN: 43 mg/dL — ABNORMAL HIGH (ref 8–23)
CO2: 24 mmol/L (ref 22–32)
Calcium: 7.8 mg/dL — ABNORMAL LOW (ref 8.9–10.3)
Chloride: 91 mmol/L — ABNORMAL LOW (ref 98–111)
Creatinine, Ser: 8.58 mg/dL — ABNORMAL HIGH (ref 0.61–1.24)
GFR, Estimated: 6 mL/min — ABNORMAL LOW (ref >60)
Glucose, Bld: 118 mg/dL — ABNORMAL HIGH (ref 70–99)
Potassium: 4.6 mmol/L (ref 3.5–5.1)
Sodium: 129 mmol/L — ABNORMAL LOW (ref 135–145)

## 2023-02-16 LAB — RENAL FUNCTION PANEL
Albumin: 2.8 g/dL — ABNORMAL LOW (ref 3.5–5.0)
Anion gap: 14 (ref 5–15)
BUN: 82 mg/dL — ABNORMAL HIGH (ref 8–23)
CO2: 21 mmol/L — ABNORMAL LOW (ref 22–32)
Calcium: 8.3 mg/dL — ABNORMAL LOW (ref 8.9–10.3)
Chloride: 94 mmol/L — ABNORMAL LOW (ref 98–111)
Creatinine, Ser: 13.73 mg/dL — ABNORMAL HIGH (ref 0.61–1.24)
GFR, Estimated: 4 mL/min — ABNORMAL LOW (ref >60)
Glucose, Bld: 101 mg/dL — ABNORMAL HIGH (ref 70–99)
Phosphorus: 5.8 mg/dL — ABNORMAL HIGH (ref 2.5–4.6)
Potassium: 7 mmol/L (ref 3.5–5.1)
Sodium: 129 mmol/L — ABNORMAL LOW (ref 135–145)

## 2023-02-16 LAB — HEPATITIS B SURFACE ANTIGEN: Hepatitis B Surface Ag: NONREACTIVE

## 2023-02-16 LAB — HEMOGLOBIN A1C
Hgb A1c MFr Bld: 6.4 % — ABNORMAL HIGH (ref 4.8–5.6)
Mean Plasma Glucose: 136.98 mg/dL

## 2023-02-16 LAB — GLUCOSE, CAPILLARY: Glucose-Capillary: 71 mg/dL (ref 70–99)

## 2023-02-16 MED ORDER — HYDROMORPHONE HCL 1 MG/ML IJ SOLN
0.5000 mg | INTRAMUSCULAR | Status: DC | PRN
  Administered 2023-02-17 – 2023-02-23 (×17): 0.5 mg via INTRAVENOUS
  Filled 2023-02-16 (×14): qty 0.5

## 2023-02-16 NOTE — Progress Notes (Signed)
Progress Note    02/16/2023 10:46 AM 1 Day Post-Op  Subjective:  Adam Howell is a 62 yo male who is now POD #1 from Aortogram with left lower extremity angiogram. Patient had angioplasty of both left posterior tibial artery and left peroneal artery. Patient is resting comfortably in bed this morning in dialysis. He denies any pain to his left lower extremity. He endorses it feels warmer and better. He states he has wound care coming to his home and would like to continue with this because it works with his dialysis schedule. No other complaints and vitals all remain stable.    Vitals:   02/16/23 1000 02/16/23 1030  BP: (!) 142/82 130/72  Pulse: 78 78  Resp: (!) 21 19  Temp:    SpO2: 95% 96%   Physical Exam: Cardiac:  regular, without  Murmurs, rubs or gallops; without carotid bruits  Lungs:  normal non-labored breathing, without Rales, rhonchi, wheezing  Incisions:  Right Groin incision, clean dry and intact. Hematoma or seroma Extremities:  Right prior AKA. Left lower extremity warm to touch throughout. Palpable DP pulse noted. No edema. Anterior tibial open ulcer/wound.  Abdomen:  Positive bowel sounds throughout, soft, NT/ND, no masses  Neurologic: A&O X 3; No focal weakness or paresthesias are detected; speech is fluent/normal. Follows commands appropriately.   CBC    Component Value Date/Time   WBC 7.7 02/16/2023 0921   RBC 4.12 (L) 02/16/2023 0921   HGB 11.8 (L) 02/16/2023 0921   HGB 16.8 01/12/2023 1414   HCT 35.1 (L) 02/16/2023 0921   HCT 51.0 01/12/2023 1414   PLT 253 02/16/2023 0921   PLT 257 01/12/2023 1414   MCV 85.2 02/16/2023 0921   MCV 88 01/12/2023 1414   MCH 28.6 02/16/2023 0921   MCHC 33.6 02/16/2023 0921   RDW 15.2 02/16/2023 0921   RDW 14.8 01/12/2023 1414   LYMPHSABS 1.0 01/12/2023 1414   MONOABS 0.5 12/24/2022 1107   EOSABS 0.2 01/12/2023 1414   BASOSABS 0.1 01/12/2023 1414    BMET    Component Value Date/Time   NA 129 (L) 02/16/2023  0921   NA 135 08/11/2022 1336   K 7.0 (HH) 02/16/2023 0921   CL 94 (L) 02/16/2023 0921   CO2 21 (L) 02/16/2023 0921   GLUCOSE 101 (H) 02/16/2023 0921   BUN 82 (H) 02/16/2023 0921   BUN 96 (HH) 08/11/2022 1336   CREATININE 13.73 (H) 02/16/2023 0921   CALCIUM 8.3 (L) 02/16/2023 0921   GFRNONAA 4 (L) 02/16/2023 0921    INR    Component Value Date/Time   INR 1.2 03/05/2022 1943     Intake/Output Summary (Last 24 hours) at 02/16/2023 1046 Last data filed at 02/15/2023 1926 Gross per 24 hour  Intake 240 ml  Output --  Net 240 ml     Assessment/Plan:  62 y.o. male is s/p Aortogram with left lower extremity angiogram. Patient had angioplasty of both left posterior tibial artery and left peroneal artery. 1 Day Post-Op   PLAN: Okay Per Vascular Surgery for patient to discharge.  Patient to discharge on ASA 81 mg daily and Eliquis 2.5 mg twice daily and Crestor 40 mg daily. Patient does not need Dual Antiplatelet therapy as he is covered by the use of Eliquis.  Patient to discharge with home health wound care twice a week as prior to hospitalization.  Patient to continue his Tuesday, Thursday, Saturday Dialysis as prior.   DVT prophylaxis:  ASA 81 mg daily and Eliquis  2.5 mg twice daily.    [facility] 02/16/2023 10:46 AM

## 2023-02-16 NOTE — Telephone Encounter (Signed)
Copied from CRM [id number]. Topic: General - Other >> Feb 15, 2023  4:36 PM Ja-Kwan M wrote: Reason for CRM: Pt reports that he is currently in the hospital and would like for Jolene to submit the request for him to be transported to a facility upon discharge. Cb#  [phone]

## 2023-02-16 NOTE — Progress Notes (Signed)
Progress Note   Patient: Adam Howell ONG:[id number] DOB: [birth year] DOA: 02/09/2023     7 DOS: the patient was seen and examined on 02/16/2023   Brief hospital course: Adam Howell is 62 y.o. male male w/ PMH ESRD on HD MWF, anemia chronic kidney disease, type 2 diabetes, chronic pain syndrome, chronic HFrEF, a/p R AKA, HLD, HTN, CAD.  Presents to the emergency department from home with C/L left leg pain and swelling x 3 to 4 days, foul odor to wound and leaking fluid for past few days.  Has been seeing wound care for this, recently treated outpatient by his PCP with doxycycline but wound has gotten worse.   Patient is seen by vascular team, advised ABI and he had angiogram with percutaneous transluminal angioplasty of left posterior tibial artery and left peroneal artery on Monday. He is on broad spectrum IV antibiotic therapy. ID called for antibiotic regimen.  Assessment and Plan: Ulcer left lower extremity with infection. Patient does have draining, worsening pain. MRI left lower extremity negative for osteomyelitis. Cultures 12/24/22 - Staph, enterococcus, enterobacter. Continue  Linezolid, Cefepime and Flagyl therapy. Vascular surgery team performed angiogram with percutaneous transluminal angioplasty of left posterior tibial artery and left peroneal artery on Monday.  ID evaluation called for antibiotic recommendations. PT/ OT eval for discharge recommendations.  ESRD on hemodialysis His usual schedule is Monday, Wednesday and Friday. Nephrology on board for dialysis needs. He had HD today after angio yesterday.  Hyperkalemia In the setting of ESRD. K 7.0 today. He did not get his usual HD yesterday due to angiogram. S/p HD today, will repeat BMP.  Chronic atrial fibrillation: Continue amiodarone, Eliquis therapy.  Chronic diastolic CHF No exacerbation. GDMT limited due to low blood pressures.  Chronic pain syndrome: Continue home dose opiates, Lyrica  therapy. IV Dilaudid decreased to 0.5 q4, continue norco as needed.  Type 2 diabetes mellitus: A1c 5.9. sugars stable. He wishes regular diet, changed to renal.  Chronic constipation: Due to chronic opiate use. Continue constipation regimen.  Obesity with BMI 32.5: Diet, exercise and weight reduction advised.  Active Pressure Injury/Wound(s)     Pressure Ulcer  Duration          Pressure Injury 03/19/22 Ischial tuberosity Left Stage 2 -  Partial thickness loss of dermis presenting as a shallow open injury with a red, pink wound bed without slough. 329 days   Pressure Injury 03/19/22 Ischial tuberosity Right Stage 2 -  Partial thickness loss of dermis presenting as a shallow open injury with a red, pink wound bed without slough. 329 days   Continue current wound care        Out of bed to chair. Incentive spirometry. Nursing supportive care. Fall, aspiration precautions. DVT prophylaxis   Code Status: Full Code  Subjective: Patient is seen and examined today morning. He is lying in bed, feels weak. Got HD today. Asks for rehab. Advised to work with PT/ OT. Eating less. Wishes to eat regular diet and not carb consistent.   Physical Exam: Vitals:   02/16/23 1130 02/16/23 1200 02/16/23 1230 02/16/23 1237  BP: 130/72 132/82 112/77 125/77  Pulse: 73 79 82 78  Resp: (!) 21 (!) 21 (!) 21 18  Temp:    98.5 F (36.9 C)  TempSrc:    Oral  SpO2: 91% 97% 93% 96%  Weight:    106.6 kg  Height:        General - Elderly obese African American male, no apparent distress HEENT -  PERRLA, EOMI, atraumatic head, non tender sinuses. Lung - Clear, diffuse rales, rhonchi, no wheezes. Heart - S1, S2 heard, no murmurs, rubs, 1+ pedal edema. Abdomen - Soft, non tender obese, bowel sounds good Neuro - sleeping, arousable, non focal exam. Skin - Warm and dry. Right AKA, Left arm fistula, Left leg dressing noted.  Data Reviewed:      Latest Ref Rng & Units 02/16/2023    9:21 AM  02/12/2023    8:23 AM 02/10/2023    5:18 AM  CBC  WBC 4.0 - 10.5 K/uL 7.7  6.5  6.3   Hemoglobin 13.0 - 17.0 g/dL 52.8  41.3  24.4   Hematocrit 39.0 - 52.0 % 35.1  34.8  38.8   Platelets 150 - 400 K/uL 253  237  222       Latest Ref Rng & Units 02/16/2023    9:21 AM 02/12/2023    8:23 AM 02/11/2023    5:20 AM  BMP  Glucose 70 - 99 mg/dL 010  272  83   BUN 8 - 23 mg/dL 82  63  48   Creatinine 0.61 - 1.24 mg/dL 53.66  44.03  47.42   Sodium 135 - 145 mmol/L 129  131  134   Potassium 3.5 - 5.1 mmol/L 7.0  4.7  4.5   Chloride 98 - 111 mmol/L 94  91  95   CO2 22 - 32 mmol/L 21  24  26    Calcium 8.9 - 10.3 mg/dL 8.3  7.7  7.9    PERIPHERAL VASCULAR CATHETERIZATION  Result Date: 02/15/2023 See surgical note for result.    Family Communication: Discussed with patient, he understand and agree. All questions answereed.  Disposition: Status is: Inpatient Remains inpatient appropriate because: wound infection, IV antibiotics. Vascular, ID follow up.  Planned Discharge Destination: Home with Home Health and Rehab     Time spent: 36 minutes  Author: [name], MD 02/16/2023 3:11 PM Secure chat 7am to 7pm For on call review [email or web address].

## 2023-02-16 NOTE — Consult Note (Addendum)
NAME: Adam Howell  DOB: 1960/10/17  MRN: [id number]  Date/Time: 02/16/2023 3:25 PM  REQUESTING PROVIDER: dr.Sreeram Subjective:  REASON FOR CONSULT: Left leg wound ? [place] Adam Howell is a 62 y.o. male with a history of ESRD on dialysis, Anemia, DM, Chronic pain syndrome, Rt AKA, HTN CAD, presented to the ED from home with left leg pain and swelling and a wound with foul discharge. He has had a wound for many months now HE was managed by wound clinic and a culture done on 101/0 had enterobacter, enterococcus and staph epidermidis He was seen by vascular and underwent angio and angioplasty of Left PTA, peroneal artery I am asked to see him for antibiotic management He is on triple antibiotics- linezolid, cefepime and flagyl  Past Medical History:  Diagnosis Date   Chronic kidney disease    Congestive heart failure (HCC)    Diabetes mellitus without complication (HCC)    Difficult intubation    Hyperlipidemia    Hypertension     Past Surgical History:  Procedure Laterality Date   A/V FISTULAGRAM Left 08/27/2022   Procedure: A/V Fistulagram;  Surgeon: Annice Needy, MD;  Location: [facility];  Service: Cardiovascular;  Laterality: Left;   AMPUTATION Right 03/13/2022   Procedure: AMPUTATION ABOVE KNEE;  Surgeon: Annice Needy, MD;  Location: [facility];  Service: General;  Laterality: Right;   APPLICATION OF WOUND VAC Right 03/07/2022   Procedure: APPLICATION OF WOUND VAC;  Surgeon: Bertram Denver, MD;  Location: [facility] OR[facility];  Service: Vascular;  Laterality: Right;  [place]AC [id number]   LOWER EXTREMITY ANGIOGRAPHY Left 02/15/2023   Procedure: Lower Extremity Angiography;  Surgeon: Annice Needy, MD;  Location: [facility];  Service: Cardiovascular;  Laterality: Left;   WOUND DEBRIDEMENT Left    WOUND DEBRIDEMENT Right 01/16/2022   Procedure: DEBRIDEMENT WOUND;  Surgeon: Renford Dills, MD;  Location: [facility];  Service: Vascular;  Laterality: Right;  Wound vac placement    WOUND DEBRIDEMENT Right 03/07/2022   Procedure: DEBRIDEMENT WOUND RIGHT LOWER EXTREMITY;  Surgeon: Bertram Denver, MD;  Location: [facility];  Service: Vascular;  Laterality: Right;    Social History   Socioeconomic History   Marital status: Divorced    Spouse name: Netanel Haneline   Number of children: 4   Years of education: Not on file   Highest education level: Not on file  Occupational History   Not on file  Tobacco Use   Smoking status: Never   Smokeless tobacco: Never  Vaping Use   Vaping status: Never Used  Substance and Sexual Activity   Alcohol use: Not Currently   Drug use: Never   Sexual activity: Not Currently  Other Topics Concern   Not on file  Social History Narrative   Lives with daughter [name].     Social Determinants of Health   Financial Resource Strain: Low Risk  (11/25/2021)   Overall Financial Resource Strain (CARDIA)    Difficulty of Paying Living Expenses: Not hard at all  Food Insecurity: No Food Insecurity (02/09/2023)   Hunger Vital Sign    Worried About Running Out of Food in the Last Year: Never true    Ran Out of Food in the Last Year: Never true  Transportation Needs: No Transportation Needs (02/09/2023)   PRAPARE - Administrator, Civil Service (Medical): No    Lack of Transportation (Non-Medical): No  Recent Concern: Transportation Needs - Unmet Transportation Needs (12/29/2022)  PRAPARE - Administrator, Civil Service (Medical): Yes    Lack of Transportation (Non-Medical): Yes  Physical Activity: Insufficiently Active (11/25/2021)   Exercise Vital Sign    Days of Exercise per Week: 2 days    Minutes of Exercise per Session: 60 min  Stress: No Stress Concern Present (11/25/2021)   [employer] of Occupational Health - Occupational Stress Questionnaire    Feeling of Stress : Not at all  Social Connections: Moderately Isolated (12/29/2022)   Social Connection and Isolation Panel [NHANES]    Frequency of  Communication with Friends and Family: More than three times a week    Frequency of Social Gatherings with Friends and Family: Three times a week    Attends Religious Services: 1 to 4 times per year    Active Member of Clubs or Organizations: No    Attends Banker Meetings: Never    Marital Status: Never married  Intimate Partner Violence: Not At Risk (02/09/2023)   Humiliation, Afraid, Rape, and Kick questionnaire    Fear of Current or Ex-Partner: No    Emotionally Abused: No    Physically Abused: No    Sexually Abused: No    Family History  Problem Relation Age of Onset   Heart failure Mother    Cirrhosis Father    Hypertension Daughter    Allergies  Allergen Reactions   Tramadol Rash   I? Current Facility-Administered Medications  Medication Dose Route Frequency Provider Last Rate Last Admin   acetaminophen (TYLENOL) tablet 650 mg  650 mg Oral Q6H PRN Annice Needy, MD   650 mg at 02/16/23 1610   Or   acetaminophen (TYLENOL) suppository 650 mg  650 mg Rectal Q6H PRN Annice Needy, MD       amiodarone (PACERONE) tablet 200 mg  200 mg Oral BID Annice Needy, MD   200 mg at 02/16/23 9604   apixaban (ELIQUIS) tablet 2.5 mg  2.5 mg Oral BID Annice Needy, MD   2.5 mg at 02/16/23 5409   aspirin EC tablet 81 mg  81 mg Oral Daily Annice Needy, MD   81 mg at 02/16/23 8119   calcitRIOL (ROCALTROL) capsule 1.5 mcg  1.5 mcg Oral Q M,W,F-1800 Annice Needy, MD   1.5 mcg at 02/15/23 1751   ceFEPIme (MAXIPIME) 1 g in sodium chloride 0.9 % 100 mL IVPB  1 g Intravenous Q24H Annice Needy, MD 200 mL/hr at 02/14/23 1728 1 g at 02/14/23 1728   Chlorhexidine Gluconate Cloth 2 % PADS 6 each  6 each Topical Q0600 Annice Needy, MD   6 each at 02/16/23 0519   cinacalcet (SENSIPAR) tablet 30 mg  30 mg Oral Q breakfast Annice Needy, MD   30 mg at 02/16/23 0813   docusate sodium (COLACE) capsule 100 mg  100 mg Oral BID Annice Needy, MD   100 mg at 02/16/23 1478   hydrALAZINE (APRESOLINE) injection  5 mg  5 mg Intravenous Q4H PRN Annice Needy, MD       HYDROcodone-acetaminophen (NORCO/VICODIN) 5-325 MG per tablet 2 tablet  2 tablet Oral Q4H PRN Annice Needy, MD   2 tablet at 02/16/23 0943   HYDROmorphone (DILAUDID) injection 0.5 mg  0.5 mg Intravenous Q4H PRN [name], MD       leptospermum manuka honey (MEDIHONEY) paste 1 Application  1 Application Topical Daily Annice Needy, MD   1 Application at 02/13/23 2111  linezolid (ZYVOX) tablet 600 mg  600 mg Oral Q12H Annice Needy, MD   600 mg at 02/16/23 0813   LORazepam (ATIVAN) injection 1 mg  1 mg Intravenous Once PRN Annice Needy, MD       melatonin tablet 2.5 mg  2.5 mg Oral QHS Annice Needy, MD   2.5 mg at 02/15/23 2157   metroNIDAZOLE (FLAGYL) tablet 500 mg  500 mg Oral Q12H Annice Needy, MD   500 mg at 02/16/23 0813   multivitamin (RENA-VIT) tablet 1 tablet  1 tablet Oral QHS Annice Needy, MD   1 tablet at 02/15/23 2158   ondansetron (ZOFRAN) tablet 4 mg  4 mg Oral Q6H PRN Annice Needy, MD       Or   ondansetron [facility]) injection 4 mg  4 mg Intravenous Q6H PRN Annice Needy, MD   4 mg at 02/16/23 6578   pregabalin (LYRICA) capsule 25 mg  25 mg Oral Daily Annice Needy, MD   25 mg at 02/16/23 4696   rosuvastatin (CRESTOR) tablet 40 mg  40 mg Oral Daily Annice Needy, MD   40 mg at 02/16/23 2952   senna (SENOKOT) tablet 17.2 mg  2 tablet Oral QHS Annice Needy, MD   17.2 mg at 02/15/23 2157   sevelamer carbonate (RENVELA) tablet 2,400 mg  2,400 mg Oral TID WC Annice Needy, MD   2,400 mg at 02/16/23 8413     Abtx:  Anti-infectives (From admission, onward)    Start     Dose/Rate Route Frequency Ordered Stop   02/16/23 0000  ceFAZolin (ANCEF) IVPB 1 g/50 mL premix       Note to Pharmacy: Send with pt to OR   1 g 100 mL/hr over 30 Minutes Intravenous On call 02/15/23 1149 02/15/23 1255   02/12/23 1800  ceFEPIme (MAXIPIME) 1 g in sodium chloride 0.9 % 100 mL IVPB        1 g 200 mL/hr over 30 Minutes Intravenous Every 24 hours  02/11/23 0840     02/11/23 1000  linezolid (ZYVOX) tablet 600 mg        600 mg Oral Every 12 hours 02/11/23 0840     02/11/23 1000  ceFEPIme (MAXIPIME) 1 g in sodium chloride 0.9 % 100 mL IVPB        1 g 200 mL/hr over 30 Minutes Intravenous  Once 02/11/23 0840 02/11/23 2143   02/11/23 1000  metroNIDAZOLE (FLAGYL) tablet 500 mg        500 mg Oral Every 12 hours 02/11/23 0840     02/10/23 1200  vancomycin (VANCOCIN) IVPB 1000 mg/200 mL premix  Status:  Discontinued        1,000 mg 200 mL/hr over 60 Minutes Intravenous Every M-W-F (Hemodialysis) 02/09/23 1632 02/11/23 0839   02/09/23 2000  piperacillin-tazobactam (ZOSYN) IVPB 2.25 g  Status:  Discontinued        2.25 g 100 mL/hr over 30 Minutes Intravenous Every 8 hours 02/09/23 1648 02/11/23 0839   02/09/23 1245  vancomycin (VANCOREADY) IVPB 1500 mg/300 mL        1,500 mg 150 mL/hr over 120 Minutes Intravenous  Once 02/09/23 1240 02/09/23 1635   02/09/23 1239  vancomycin variable dose per unstable renal function (pharmacist dosing)  Status:  Discontinued         Does not apply See admin instructions 02/09/23 1240 02/09/23 1632   02/09/23 1230  vancomycin (VANCOCIN) IVPB 1000  mg/200 mL premix  Status:  Discontinued        1,000 mg 200 mL/hr over 60 Minutes Intravenous  Once 02/09/23 1221 02/09/23 1240   02/09/23 1045  piperacillin-tazobactam (ZOSYN) IVPB 2.25 g        2.25 g 100 mL/hr over 30 Minutes Intravenous  Once 02/09/23 1019 02/09/23 1211   02/09/23 1030  vancomycin (VANCOCIN) IVPB 1000 mg/200 mL premix        1,000 mg 200 mL/hr over 60 Minutes Intravenous  Once 02/09/23 1019 02/09/23 1415       REVIEW OF SYSTEMS:  Const: negative fever, negative chills, negative weight loss Eyes: negative diplopia or visual changes, negative eye pain ENT: negative coryza, negative sore throat Resp: negative cough, hemoptysis, dyspnea Cards: negative for chest pain, palpitations, lower extremity edema GU: negative for frequency, dysuria and  hematuria GI: Negative for abdominal pain, diarrhea, bleeding, constipation Skin: wound Heme: negative for easy bruising and gum/nose bleeding MS: weakness Neurolo:negative for headaches, dizziness, vertigo, memory problems  Psych: negative for feelings of anxiety, depression  Endocrine: no diabetes Allergy/Immunology- Tramadol Objective:  VITALS:  BP 125/77 (BP Location: Right Arm)   Pulse 78   Temp 98.5 F (36.9 C) (Oral)   Resp 18   Ht 6\' 3"  (1.905 m)   Wt 106.6 kg   SpO2 96%   BMI 29.37 kg/m   PHYSICAL EXAM:  General: Alert, cooperative, no distress, appears stated age.  Head: Normocephalic, without obvious abnormality, atraumatic. Eyes: Conjunctivae clear, anicteric sclerae. Pupils are equal ENT Nares normal. No drainage or sinus tenderness. Lips, mucosa, and tongue normal. No Thrush Neck: Supple, symmetrical, no adenopathy, thyroid: non tender no carotid bruit and no JVD. Back: No CVA tenderness. Lungs: Clear to auscultation bilaterally. No Wheezing or Rhonchi. No rales. Heart: Regular rate and rhythm, no murmur, rub or gallop. Abdomen: Soft, non-tender,not distended. Bowel sounds normal. No masses Extremities:rt AKA Left arm graft Left shin wound with eschar and scab   Skin: No rashes or lesions. Or bruising Lymph: Cervical, supraclavicular normal. Neurologic: Grossly non-focal Pertinent Labs Lab Results CBC    Component Value Date/Time   WBC 7.7 02/16/2023 0921   RBC 4.12 (L) 02/16/2023 0921   HGB 11.8 (L) 02/16/2023 0921   HGB 16.8 01/12/2023 1414   HCT 35.1 (L) 02/16/2023 0921   HCT 51.0 01/12/2023 1414   PLT 253 02/16/2023 0921   PLT 257 01/12/2023 1414   MCV 85.2 02/16/2023 0921   MCV 88 01/12/2023 1414   MCH 28.6 02/16/2023 0921   MCHC 33.6 02/16/2023 0921   RDW 15.2 02/16/2023 0921   RDW 14.8 01/12/2023 1414   LYMPHSABS 1.0 01/12/2023 1414   MONOABS 0.5 12/24/2022 1107   EOSABS 0.2 01/12/2023 1414   BASOSABS 0.1 01/12/2023 1414        Latest Ref Rng & Units 02/16/2023    9:21 AM 02/12/2023    8:23 AM 02/11/2023    5:20 AM  CMP  Glucose 70 - 99 mg/dL 324  401  83   BUN 8 - 23 mg/dL 82  63  48   Creatinine 0.61 - 1.24 mg/dL 02.72  53.66  44.03   Sodium 135 - 145 mmol/L 129  131  134   Potassium 3.5 - 5.1 mmol/L 7.0  4.7  4.5   Chloride 98 - 111 mmol/L 94  91  95   CO2 22 - 32 mmol/L 21  24  26    Calcium 8.9 - 10.3 mg/dL 8.3  7.7  7.9       Microbiology: Recent Results (from the past 240 hour(s))  Culture, blood (routine x 2)     Status: None   Collection Time: 02/09/23 10:30 AM   Specimen: BLOOD  Result Value Ref Range Status   Specimen Description BLOOD RIGHT [facility]  Final   Special Requests BOTTLES DRAWN AEROBIC AND ANAEROBIC  Final   Culture   Final    NO GROWTH 5 DAYS Performed at [facility], [address] [place], [place] [address]    Report Status 02/14/2023 FINAL  Final  Culture, blood (routine x 2)     Status: None   Collection Time: 02/09/23  9:46 PM   Specimen: BLOOD  Result Value Ref Range Status   Specimen Description BLOOD RIGHT HAND  Final   Special Requests   Final    BOTTLES DRAWN AEROBIC AND ANAEROBIC Blood Culture adequate volume   Culture   Final    NO GROWTH 5 DAYS Performed at [facility] [address] [place], [place] [address]    Report Status 02/14/2023 FINAL  Final    IMAGING RESULTS: US arterial- resting ABI N MRI leg no clear osteo I have personally reviewed the films ? Impression/Recommendation ?chronic leg shin painful wound- necrosis and eschar Need to r/o calciphylaxis in a patient with ESRD Recommend biopsy D>D vascular insufficiency Currently on triple antibiotics Repeat culture sent Antibiotics dont have a major role if calciphylaxis Can do vanco and cefepime during dialysis for 2 weeks  ESRD on dialysis Anemia Rt AKA  HTN  Chronic afib ? ? ___I have personally spent  --75 -minutes involved in face-to-face and  non-face-to-face activities for this patient on the day of the visit. Professional time spent includes the following activities: Preparing to see the patient (review of tests), Obtaining and/or reviewing separately obtained history (admission/discharge record), Performing a medically appropriate examination and/or evaluation , Ordering medications/tests/procedures, referring and communicating with other health care professionals, Documenting clinical information in the EMR, Independently interpreting results (not separately reported), Communicating results to the patient/family/caregiver, Counseling and educating the patient/family/caregiver and Care coordination (not separately reported).    ________________________________________________ Discussed with patient, requesting provider Note:  This document was prepared using Dragon voice recognition software and may include unintentional dictation errors.

## 2023-02-16 NOTE — Progress Notes (Signed)
Critical lab notification:  Dr Clide Dales was notified at 1030 about patient's potassium levels being 7.0. No new orders were received.

## 2023-02-16 NOTE — Telephone Encounter (Signed)
They would decide this upon discharge from the hospital correct?

## 2023-02-16 NOTE — TOC Initial Note (Signed)
Transition of Care [facility]) - Initial/Assessment Note    Patient Details  Name: Adam Howell MRN: [id number] Date of Birth: [birth year]  [facility] CM/SW Contact:    Chapman Fitch, RN Phone Number: 02/16/2023, 6:24 PM  Clinical Narrative:                  Met with patient at bedside Admitted GMW:NUUVO left lower extremity with infection.  Admitted from: Lives at home with Niece PCP: NP [place]  [facility]acy: [facility] - denies issues obtaining medications Current home health/prior home health/DME: [facility], [facility], prosthetic    Noted documentation on 11/29 that patient missed HD and wound care appointment due to transportation. Patient denies this and states that he has went to all of his appointments.  Patient denies issues with transportation, states that he has [facility] transport to take him to his HD and appointments  Notified by [facility] CSW that patient inquires about STR  Discussed with patient and he confirms that he feels like he needs to go to rehab at discharge.  PT OT eval pending        Patient Goals and CMS Choice            Expected Discharge Plan and Services                                              Prior Living Arrangements/Services                       Activities of Daily Living   ADL Screening (condition at time of admission) Independently performs ADLs?: No Does the patient have a NEW difficulty with bathing/dressing/toileting/self-feeding that is expected to last >3 days?: No Does the patient have a NEW difficulty with getting in/out of bed, walking, or climbing stairs that is expected to last >3 days?: No Does the patient have a NEW difficulty with communication that is expected to last >3 days?: No Is the patient deaf or have difficulty hearing?: No Does the patient have difficulty seeing, even when wearing glasses/contacts?: No Does the patient have difficulty concentrating, remembering, or making  decisions?: No  Permission Sought/Granted                  Emotional Assessment              Admission diagnosis:  Wound infection [T14.8XXA, L08.9] Left leg cellulitis [L03.116] Infected ulcer of skin (HCC) [L98.499, L08.9] Patient Active Problem List   Diagnosis Date Noted   Infected ulcer of skin (HCC) 02/09/2023   Ulcer of left lower extremity, limited to breakdown of skin (HCC) 12/10/2022   S/P AKA (above knee amputation), right (HCC) 03/15/2022   Hypotension 03/08/2022   Wound infection of chronic right lower extremity ulcer 01/13/2022   Controlled substance agreement signed 04/10/2021   Other thrombophilia (HCC) 01/19/2020   Left leg pain 12/25/2019   Atrial fibrillation, chronic (HCC) 11/11/2019   Hyperlipidemia associated with type 2 diabetes mellitus (HCC) 11/11/2019   Erectile dysfunction 11/11/2019   Heart failure with reduced ejection fraction (HCC) 10/26/2019   Type 2 diabetes mellitus with ESRD (end-stage renal disease) (HCC) 10/04/2019   Chronic pain syndrome 10/04/2019   Insomnia 10/04/2019   Atherosclerotic heart disease of native coronary artery without angina pectoris 09/01/2019   Hypertensive heart and kidney disease with heart failure  and chronic kidney disease stage V (HCC) 09/01/2019   Obesity (BMI 30-39.9) 09/01/2019   Anemia in chronic kidney disease 07/25/2019   ESRD on dialysis [facility]) 07/25/2019   Secondary hyperparathyroidism of renal origin (HCC) 07/25/2019   PCP:  Marjie Skiff, NP Pharmacy:   [facility] [address], [place] - [address] [place] [address] Phone: [phone] Fax: [phone]  [id number] - [address] ([address]), [place] - [address] [place] ([place]) [place] [address] Phone: [phone] Fax: [phone]     Social Determinants of Health (SDOH) Social History: SDOH Screenings   Food Insecurity: No Food Insecurity (02/09/2023)  Housing:  Low Risk  (02/09/2023)  Transportation Needs: No Transportation Needs (02/09/2023)  Recent Concern: Transportation Needs - Unmet Transportation Needs (12/29/2022)  Utilities: Not At Risk (02/09/2023)  Alcohol Screen: Low Risk  (11/25/2021)  Depression (PHQ2-9): Low Risk  (12/29/2022)  Financial Resource Strain: Low Risk  (11/25/2021)  Physical Activity: Insufficiently Active (11/25/2021)  Social Connections: Moderately Isolated (12/29/2022)  Stress: No Stress Concern Present (11/25/2021)  Tobacco Use: Low Risk  (02/09/2023)   SDOH Interventions:     Readmission Risk Interventions    02/16/2023    6:21 PM  Readmission Risk Prevention Plan  Transportation Screening Complete  Palliative Care Screening Not Applicable  Medication Review (RN Care Manager) Complete

## 2023-02-16 NOTE — Progress Notes (Signed)
[facility]  ROUNDING NOTE   Subjective:   Adam Howell is a 62 y.o male with past medical history of diabetes, hypertension, CAD, Rt AKA, anemia, CHF, and ESRD on dialysis. Patient presents to ED with left leg pain and swelling. He has been admitted for Wound infection [T14.8XXA, L08.9] Left leg cellulitis [L03.116] Infected ulcer of skin (HCC) [L98.499, L08.9]  Patient known to our practice and receives outpatient dialysis treatments at [facility] on a MWF schedule, supervised by [facility] physicians.   Patient seen and evaluated during dialysis   HEMODIALYSIS FLOWSHEET:  Blood Flow Rate (mL/min): 399 mL/min Arterial Pressure (mmHg): -187.46 mmHg Venous Pressure (mmHg): 236.75 mmHg TMP (mmHg): -0.61 mmHg Ultrafiltration Rate (mL/min): 598 mL/min Dialysate Flow Rate (mL/min): 299 ml/min  Tolerating treatment well Denies pain or discomfort   Objective:  Vital signs in last 24 hours:  Temp:  [98.4 F (36.9 C)-98.7 F (37.1 C)] 98.5 F (36.9 C) (12/03 1237) Pulse Rate:  [69-99] 78 (12/03 1237) Resp:  [15-21] 18 (12/03 1237) BP: (112-155)/(63-96) 125/77 (12/03 1237) SpO2:  [88 %-98 %] 96 % (12/03 1237) Weight:  [106.6 kg-107.7 kg] 106.6 kg (12/03 1237)  Weight change:  Filed Weights   02/12/23 1252 02/16/23 0921 02/16/23 1237  Weight: 101.8 kg 107.7 kg 106.6 kg    Intake/Output: I/O last 3 completed shifts: In: 240 [P.O.:240] Out: -    Intake/Output this shift:  Total I/O In: -  Out: 1000 [Other:1000]  Physical Exam: General: NAD  Head: Normocephalic, atraumatic. Moist oral mucosal membranes  Eyes: Anicteric  Lungs:  Clear to auscultation, normal effort  Heart: Regular rate and rhythm  Abdomen:  Soft, nontender  Extremities:  No peripheral edema.  Neurologic: Alert and oriented, moving all four extremities  Skin: LLE wound, ace bandage  Access: Lt AVF    Basic Metabolic Panel: Recent Labs  Lab 02/10/23 0518 02/11/23 0520  02/12/23 0823 02/16/23 0921  NA 132* 134* 131* 129*  K 4.2 4.5 4.7 7.0*  CL 90* 95* 91* 94*  CO2 27 26 24  21*  GLUCOSE 84 83 102* 101*  BUN 56* 48* 63* 82*  CREATININE 11.64* 10.32* 12.36* 13.73*  CALCIUM 8.3* 7.9* 7.7* 8.3*  PHOS 6.1* 4.8* 5.6* 5.8*    Liver Function Tests: Recent Labs  Lab 02/11/23 0520 02/12/23 0823 02/16/23 0921  ALBUMIN 2.7* 2.6* 2.8*   No results for input(s): "LIPASE", "AMYLASE" in the last 168 hours. No results for input(s): "AMMONIA" in the last 168 hours.  CBC: Recent Labs  Lab 02/10/23 0518 02/12/23 0823 02/16/23 0921  WBC 6.3 6.5 7.7  HGB 13.0 11.7* 11.8*  HCT 38.8* 34.8* 35.1*  MCV 86.2 86.8 85.2  PLT 222 237 253    Cardiac Enzymes: No results for input(s): "CKTOTAL", "CKMB", "CKMBINDEX", "TROPONINI" in the last 168 hours.  BNP: Invalid input(s): "POCBNP"  CBG: Recent Labs  Lab 02/15/23 0935 02/16/23 0751  GLUCAP 74 71    Microbiology: Results for orders placed or performed during the hospital encounter of 02/09/23  Culture, blood (routine x 2)     Status: None   Collection Time: 02/09/23 10:30 AM   Specimen: BLOOD  Result Value Ref Range Status   Specimen Description BLOOD RIGHT [facility]  Final   Special Requests BOTTLES DRAWN AEROBIC AND ANAEROBIC  Final   Culture   Final    NO GROWTH 5 DAYS Performed at [facility], [address] [place], [place] [address]    Report Status 02/14/2023 FINAL  Final  Culture, blood (routine x 2)     Status: None   Collection Time: 02/09/23  9:46 PM   Specimen: BLOOD  Result Value Ref Range Status   Specimen Description BLOOD RIGHT HAND  Final   Special Requests   Final    BOTTLES DRAWN AEROBIC AND ANAEROBIC Blood Culture adequate volume   Culture   Final    NO GROWTH 5 DAYS Performed at [facility], [address] [place], [place] [address]    Report Status 02/14/2023 FINAL  Final    Coagulation Studies: No results for input(s): "LABPROT", "INR" in the  last 72 hours.  Urinalysis: No results for input(s): "COLORURINE", "LABSPEC", "PHURINE", "GLUCOSEU", "HGBUR", "BILIRUBINUR", "KETONESUR", "PROTEINUR", "UROBILINOGEN", "NITRITE", "LEUKOCYTESUR" in the last 72 hours.  Invalid input(s): "APPERANCEUR"    Imaging: PERIPHERAL VASCULAR CATHETERIZATION  Result Date: 02/15/2023 See surgical note for result.    Medications:    ceFEPime (MAXIPIME) IV 1 g (02/14/23 1728)    amiodarone  200 mg Oral BID   apixaban  2.5 mg Oral BID   aspirin EC  81 mg Oral Daily   calcitRIOL  1.5 mcg Oral Q M,W,F-1800   Chlorhexidine Gluconate Cloth  6 each Topical Q0600   cinacalcet  30 mg Oral Q breakfast   docusate sodium  100 mg Oral BID   leptospermum manuka honey  1 Application Topical Daily   linezolid  600 mg Oral Q12H   melatonin  2.5 mg Oral QHS   metroNIDAZOLE  500 mg Oral Q12H   multivitamin  1 tablet Oral QHS   pregabalin  25 mg Oral Daily   rosuvastatin  40 mg Oral Daily   senna  2 tablet Oral QHS   sevelamer carbonate  2,400 mg Oral TID WC   acetaminophen **OR** acetaminophen, hydrALAZINE, HYDROcodone-acetaminophen, HYDROmorphone (DILAUDID) injection, LORazepam, ondansetron **OR** ondansetron (ZOFRAN) IV  Assessment/ Plan:  Adam Howell is a 62 y.o.  male with past medical history of diabetes, hypertension, CAD, Rt AKA, anemia, CHF, and ESRD on dialysis. Patient presents to ED with left leg pain and swelling. He has been admitted for Wound infection [T14.8XXA, L08.9] Left leg cellulitis [L03.116] Infected ulcer of skin (HCC) [L98.499, L08.9]  [facility]/MWF/Lt AVF  End stage renal disease on hemodialysis.  Patient receiving dialysis today due to procedure scheduled for yesterday.  Tolerating treatment well today.  Next treatment scheduled for Wednesday to maintain outpatient schedule.  Will defer discharge plan to primary team.  2. Anemia of chronic kidney disease Lab Results  Component Value Date   HGB 11.8 (L)  02/16/2023    Hemoglobin within desired target. No need for ESA.   3. Wound infection, Left leg. Imaging negative for osteomyelitis. Receiving cefepime, Flagyl, linezolid.  Vascular ABI study normal. Vascular surgery performed angioplasty with intervention on 02/15/2023.  ID consulted for antibiotic recommendations.    4. Secondary Hyperparathyroidism: with outpatient labs: PTH 558, phosphorus 7.0, calcium 8.2 on 01/20/23.   Lab Results  Component Value Date   CALCIUM 8.3 (L) 02/16/2023   PHOS 5.8 (H) 02/16/2023    Prescribed calcitriol, cinacalcet and sevelamer outpatient.  Receiving these medications along with sevelamer with meals. Will continue to monitor bone minerals.    LOS: 7 Adam Howell 12/3/20241:11 PM

## 2023-02-16 NOTE — Plan of Care (Signed)

## 2023-02-16 NOTE — Patient Outreach (Signed)
  Care Coordination   Collaboration   Visit Note   02/16/2023 Name: Adam Howell MRN: [id number] DOB: Apr 12, 1960  Adam Howell is a 62 y.o. year old male who sees Aura Dials T, NP for primary care. I  contacted the Transition of Care RN to discuss patient's request for possible rehab stay.  What matters to the patients health and wellness today?  :Placement in short term rehab.    Goals Addressed             This Visit's Progress    Care Coordination Activities       Interventions Today    Flowsheet Row Most Recent Value  General Interventions   General Interventions Discussed/Reviewed Communication with  Communication with RN  [TOC RN to request assistance with patient's request for rehab placement following  his hospital stay. TOC RN added MD to secure chat who has ordered PT/OT evaluation]               SDOH assessments and interventions completed:  No     Care Coordination Interventions:  Yes, provided   Follow up plan: Follow up call scheduled for 02/23/23    Encounter Outcome:  Patient Visit Completed

## 2023-02-16 NOTE — Telephone Encounter (Signed)
Called and LVM notifying patient of Jolene's message.

## 2023-02-16 NOTE — Progress Notes (Signed)
Hemodialysis note  Received patient in bed to unit. Alert and oriented.  Informed consent signed and in chart.  Treatment initiated: 0933 Treatment completed: 1237  Patient tolerated well. Transported back to room, alert without acute distress.  Report given to patient's RN.   Access used: Left Arm AVF Access issues: none  Total UF removed: 1L Medication(s) given:  Hydrocodone Acetaminophen 5-325 mg tab  PO, 2 tabs  Post HD weight: 106.6 kg   [facility]

## 2023-02-17 ENCOUNTER — Ambulatory Visit: Payer: Medicare HMO | Admitting: Nurse Practitioner

## 2023-02-17 DIAGNOSIS — L089 Local infection of the skin and subcutaneous tissue, unspecified: Secondary | ICD-10-CM | POA: Diagnosis not present

## 2023-02-17 DIAGNOSIS — L03116 Cellulitis of left lower limb: Secondary | ICD-10-CM | POA: Diagnosis not present

## 2023-02-17 DIAGNOSIS — Z89611 Acquired absence of right leg above knee: Secondary | ICD-10-CM | POA: Diagnosis not present

## 2023-02-17 DIAGNOSIS — T148XXA Other injury of unspecified body region, initial encounter: Secondary | ICD-10-CM | POA: Diagnosis not present

## 2023-02-17 DIAGNOSIS — L97921 Non-pressure chronic ulcer of unspecified part of left lower leg limited to breakdown of skin: Secondary | ICD-10-CM | POA: Diagnosis not present

## 2023-02-17 LAB — RENAL FUNCTION PANEL
Albumin: 2.6 g/dL — ABNORMAL LOW (ref 3.5–5.0)
Anion gap: 14 (ref 5–15)
BUN: 54 mg/dL — ABNORMAL HIGH (ref 8–23)
CO2: 25 mmol/L (ref 22–32)
Calcium: 8.2 mg/dL — ABNORMAL LOW (ref 8.9–10.3)
Chloride: 93 mmol/L — ABNORMAL LOW (ref 98–111)
Creatinine, Ser: 11.02 mg/dL — ABNORMAL HIGH (ref 0.61–1.24)
GFR, Estimated: 5 mL/min — ABNORMAL LOW (ref >60)
Glucose, Bld: 101 mg/dL — ABNORMAL HIGH (ref 70–99)
Phosphorus: 5.4 mg/dL — ABNORMAL HIGH (ref 2.5–4.6)
Potassium: 4.6 mmol/L (ref 3.5–5.1)
Sodium: 132 mmol/L — ABNORMAL LOW (ref 135–145)

## 2023-02-17 LAB — CBC
HCT: 34.9 % — ABNORMAL LOW (ref 39.0–52.0)
Hemoglobin: 11.7 g/dL — ABNORMAL LOW (ref 13.0–17.0)
MCH: 28.3 pg (ref 26.0–34.0)
MCHC: 33.5 g/dL (ref 30.0–36.0)
MCV: 84.5 fL (ref 80.0–100.0)
Platelets: 221 10*3/uL (ref 150–400)
RBC: 4.13 MIL/uL — ABNORMAL LOW (ref 4.22–5.81)
RDW: 15.2 % (ref 11.5–15.5)
WBC: 6.4 10*3/uL (ref 4.0–10.5)
nRBC: 0 % (ref 0.0–0.2)

## 2023-02-17 MED ORDER — VANCOMYCIN HCL 2000 MG/400ML IV SOLN
2000.0000 mg | INTRAVENOUS | Status: DC
  Filled 2023-02-17: qty 400

## 2023-02-17 MED ORDER — VANCOMYCIN HCL IN DEXTROSE 1-5 GM/200ML-% IV SOLN
1000.0000 mg | Freq: Once | INTRAVENOUS | Status: AC
  Administered 2023-02-17: 1000 mg via INTRAVENOUS
  Filled 2023-02-17: qty 200

## 2023-02-17 MED ORDER — SODIUM CHLORIDE 0.9 % IV SOLN
2.0000 g | INTRAVENOUS | Status: AC
  Administered 2023-02-17 – 2023-02-24 (×4): 2 g via INTRAVENOUS
  Filled 2023-02-17 (×5): qty 12.5

## 2023-02-17 MED ORDER — HYDROMORPHONE HCL 1 MG/ML IJ SOLN
INTRAMUSCULAR | Status: AC
  Filled 2023-02-17: qty 1

## 2023-02-17 MED ORDER — VANCOMYCIN HCL IN DEXTROSE 1-5 GM/200ML-% IV SOLN
1000.0000 mg | INTRAVENOUS | Status: AC
  Administered 2023-02-19 – 2023-02-24 (×3): 1000 mg via INTRAVENOUS
  Filled 2023-02-17 (×5): qty 200

## 2023-02-17 MED ORDER — ONDANSETRON HCL 4 MG/2ML IJ SOLN
INTRAMUSCULAR | Status: AC
  Filled 2023-02-17: qty 2

## 2023-02-17 MED ORDER — VANCOMYCIN HCL IN DEXTROSE 1-5 GM/200ML-% IV SOLN
1000.0000 mg | Freq: Once | INTRAVENOUS | Status: AC
  Administered 2023-02-17: 1000 mg via INTRAVENOUS
  Filled 2023-02-17 (×2): qty 200

## 2023-02-17 NOTE — Progress Notes (Signed)
Progress Note   Patient: Adam Howell GNF:[id number] DOB: 08-15-1960 DOA: 02/09/2023     8 DOS: the patient was seen and examined on 02/17/2023     Brief hospital course: Jabar Tredinnick is 62 y.o. male male w/ PMH ESRD on HD MWF, anemia chronic kidney disease, type 2 diabetes, chronic pain syndrome, chronic HFrEF, a/p R AKA, HLD, HTN, CAD.  Presents to the emergency department from home with C/L left leg pain and swelling x 3 to 4 days, foul odor to wound and leaking fluid for past few days.  Has been seeing wound care for this, recently treated outpatient by his PCP with doxycycline but wound has gotten worse.    Patient is seen by vascular team, advised ABI and he had angiogram with percutaneous transluminal angioplasty of left posterior tibial artery and left peroneal artery on Monday. He is on broad spectrum IV antibiotic therapy. ID called for antibiotic regimen.   Assessment and Plan: Ulcer left lower extremity with infection. Patient does have draining, worsening pain. MRI left lower extremity negative for osteomyelitis. Cultures 12/24/22 - Staph, enterococcus, enterobacter. Continue  Linezolid, Cefepime and Flagyl therapy. Vascular surgery team performed angiogram with percutaneous transluminal angioplasty of left posterior tibial artery and left peroneal artery on Monday.  If patient has disease of given recommendation for 1 more week of antibiotics on dialysis PT OT has recommended skilled nursing facility [facility] working on this   ESRD on hemodialysis His usual schedule is Monday, Wednesday and Friday. Nephrology on board for dialysis needs.  Continue dialysis needs  Hyperkalemia Improved after hemodialysis   Chronic atrial fibrillation: Continue amiodarone, Eliquis therapy.   Chronic diastolic CHF No exacerbation. GDMT limited due to low blood pressures.   Chronic pain syndrome: Continue home dose opiates, Lyrica therapy. IV Dilaudid decreased to 0.5 q4,  continue norco as needed.   Type 2 diabetes mellitus: A1c 5.9. sugars stable. He wishes regular diet, changed to renal.   Chronic constipation: Due to chronic opiate use. Continue constipation regimen.   Obesity with BMI 32.5: Diet, exercise and weight reduction advised.   Active Pressure Injury/Wound(s)       Pressure Ulcer  Duration             Pressure Injury 03/19/22 Ischial tuberosity Left Stage 2 -  Partial thickness loss of dermis presenting as a shallow open injury with a red, pink wound bed without slough. 329 days    Pressure Injury 03/19/22 Ischial tuberosity Right Stage 2 -  Partial thickness loss of dermis presenting as a shallow open injury with a red, pink wound bed without slough. 329 days    Continue current wound care           Out of bed to chair. Incentive spirometry. Nursing supportive care. Fall, aspiration precautions. DVT prophylaxis   Code Status: Full Code   Subjective:  Patient seen on dialysis today He tells me he he would like to go to the rehab Denies nausea vomiting abdominal pain TOC is working on test  Physical Exam: General - Elderly obese African American male, no apparent distress HEENT - PERRLA, EOMI, atraumatic head, non tender sinuses. Lung - Clear, diffuse rales, rhonchi, no wheezes. Heart - S1, S2 heard, no murmurs, rubs, 1+ pedal edema. Abdomen - Soft, non tender obese, bowel sounds good Neuro - sleeping, arousable, non focal exam. Skin - Warm and dry. Right AKA, Left arm fistula, Left leg dressing noted.    Family Communication: Discussed with patient, he understand  and agree. All questions answereed.   Disposition: Status is: Inpatient Remains inpatient appropriate because: wound infection, IV antibiotics. Vascular, ID follow up.  Planned Discharge Destination: Home with Home Health and Rehab    Time spent: 38 minutes  Data Reviewed:     Latest Ref Rng & Units 02/17/2023   10:41 AM 02/16/2023    3:54 PM 02/16/2023     9:21 AM  BMP  Glucose 70 - 99 mg/dL 329  518  841   BUN 8 - 23 mg/dL 54  43  82   Creatinine 0.61 - 1.24 mg/dL 66.06  3.01  60.10   Sodium 135 - 145 mmol/L 132  129  129   Potassium 3.5 - 5.1 mmol/L 4.6  4.6  7.0   Chloride 98 - 111 mmol/L 93  91  94   CO2 22 - 32 mmol/L 25  24  21    Calcium 8.9 - 10.3 mg/dL 8.2  7.8  8.3        Latest Ref Rng & Units 02/17/2023   10:41 AM 02/16/2023    9:21 AM 02/12/2023    8:23 AM  CBC  WBC 4.0 - 10.5 K/uL 6.4  7.7  6.5   Hemoglobin 13.0 - 17.0 g/dL 93.2  35.5  73.2   Hematocrit 39.0 - 52.0 % 34.9  35.1  34.8   Platelets 150 - 400 K/uL 221  253  237      Vitals:   02/17/23 1300 02/17/23 1330 02/17/23 1400 02/17/23 1507  BP: 100/69 102/76 110/64 127/70  Pulse: 80 79 81 83  Resp: (!) 22 20 17 16   Temp:   97.8 F (36.6 C) 97.9 F (36.6 C)  TempSrc:   Oral   SpO2: 97% 93% 95% 98%  Weight:   103 kg   Height:         Author: Loyce Dys, MD 02/17/2023 6:17 PM  For on call review [email or web address].

## 2023-02-17 NOTE — Progress Notes (Signed)
Date of Admission:  02/09/2023    ID: Adam Howell is a 62 y.o. male  Active Problems:   Anemia in chronic kidney disease   Atherosclerotic heart disease of native coronary artery without angina pectoris   ESRD on dialysis [facility])   Hypertensive heart and kidney disease with heart failure and chronic kidney disease stage V (HCC)   Type 2 diabetes mellitus with ESRD (end-stage renal disease) (HCC)   Chronic pain syndrome   Atrial fibrillation, chronic (HCC)   Hyperlipidemia associated with type 2 diabetes mellitus (HCC)   Wound infection of chronic right lower extremity ulcer   S/P AKA (above knee amputation), right (HCC)   Ulcer of left lower extremity, limited to breakdown of skin (HCC)    Subjective: No complaints  Medications:   amiodarone  200 mg Oral BID   apixaban  2.5 mg Oral BID   aspirin EC  81 mg Oral Daily   calcitRIOL  1.5 mcg Oral Q M,W,F-1800   Chlorhexidine Gluconate Cloth  6 each Topical Q0600   cinacalcet  30 mg Oral Q breakfast   docusate sodium  100 mg Oral BID   leptospermum manuka honey  1 Application Topical Daily   melatonin  2.5 mg Oral QHS   metroNIDAZOLE  500 mg Oral Q12H   multivitamin  1 tablet Oral QHS   pregabalin  25 mg Oral Daily   rosuvastatin  40 mg Oral Daily   senna  2 tablet Oral QHS   sevelamer carbonate  2,400 mg Oral TID WC    Objective: Vital signs in last 24 hours: Patient Vitals for the past 24 hrs:  BP Temp Temp src Pulse Resp SpO2 Weight  02/17/23 1200 (!) 106/59 -- -- 82 20 97 % --  02/17/23 1130 123/75 -- -- 69 12 94 % --  02/17/23 1100 126/61 -- -- 80 12 96 % --  02/17/23 1039 122/67 -- -- 80 18 95 % --  02/17/23 1031 126/76 98.7 F (37.1 C) Oral 77 19 96 % 104.5 kg  02/17/23 0848 (!) 140/84 98 F (36.7 C) Oral 82 18 96 % --  02/17/23 0414 124/69 98.6 F (37 C) Oral 77 16 93 % --  02/16/23 2106 (!) 145/78 99 F (37.2 C) Oral 79 16 95 % --  02/16/23 1539 (!) 144/73 98.8 F (37.1 C) Oral 78 -- 97 % --   02/16/23 1237 125/77 98.5 F (36.9 C) Oral 78 18 96 % 106.6 kg  02/16/23 1230 112/77 -- -- 82 (!) 21 93 % --       PHYSICAL EXAM:  General: Alert, cooperative, no distress, appears stated age.  Lungs: Clear to auscultation bilaterally. No Wheezing or Rhonchi. No rales. Heart: Regular rate and rhythm, no murmur, rub or gallop. Abdomen: Soft, non-tender,not distended. Bowel sounds normal. No masses Extremities: left leg dressing not removed  Skin: No rashes or lesions. Or bruising Lymph: Cervical, supraclavicular normal. Neurologic: Grossly non-focal  Lab Results    Latest Ref Rng & Units 02/17/2023   10:41 AM 02/16/2023    9:21 AM 02/12/2023    8:23 AM  CBC  WBC 4.0 - 10.5 K/uL 6.4  7.7  6.5   Hemoglobin 13.0 - 17.0 g/dL 16.1  09.6  04.5   Hematocrit 39.0 - 52.0 % 34.9  35.1  34.8   Platelets 150 - 400 K/uL 221  253  237        Latest Ref Rng & Units 02/17/2023  10:41 AM 02/16/2023    3:54 PM 02/16/2023    9:21 AM  CMP  Glucose 70 - 99 mg/dL 811  914  782   BUN 8 - 23 mg/dL 54  43  82   Creatinine 0.61 - 1.24 mg/dL 95.62  1.30  86.57   Sodium 135 - 145 mmol/L 132  129  129   Potassium 3.5 - 5.1 mmol/L 4.6  4.6  7.0   Chloride 98 - 111 mmol/L 93  91  94   CO2 22 - 32 mmol/L 25  24  21    Calcium 8.9 - 10.3 mg/dL 8.2  7.8  8.3       Microbiology: Wound culture sent on 02/16/23 Studies/Results: PERIPHERAL VASCULAR CATHETERIZATION  Result Date: 02/15/2023 See surgical note for result.    Assessment/Plan: chronic leg shin painful wound-with  eschar  D>D vascular insufficiency calciphylaxis Currently on triple antibiotics Repeat culture sent Can do vanco and cefepime during dialysis until 02/24/23 If no improvement then recommend biopsy to r/o calciphylaxis   ESRD on dialysis Anemia Rt AKA   HTN   Chronic afib  Discussed the management with patient and nephrologist and hospitalist ID will sign off- call if needed

## 2023-02-17 NOTE — Progress Notes (Signed)
PHARMACY CONSULT NOTE FOR:  OUTPATIENT  PARENTERAL ANTIBIOTIC THERAPY (OPAT)  Indication: leg wound Regimen: vancomycin 1gm IV MWF with HD and Cefepime 2gm IV MWF with HD End date: 02/24/2023  Labs - Weekly:  CBC/D, CMP, and vancomycin trough.  Fax weekly lab results  promptly to [phone]   IV antibiotic discharge orders are pended. To discharging provider:  please sign these orders via discharge navigator,  Select New Orders & click on the button choice - Manage This Unsigned Work.     Thank you for allowing pharmacy to be a part of this patient's care.  Juliette Alcide, PharmD, BCPS, BCIDP Work Cell: [phone] 02/17/2023 12:50 PM

## 2023-02-17 NOTE — Treatment Plan (Signed)
Diagnosis: Left leg ulcer Baseline Creatinine ESRD    Allergies  Allergen Reactions   Tramadol Rash    OPAT Orders Discharge antibiotics: Vancomycin 1 gram during dialysis M_-W-F until 02/24/23  Cefepime 2 grams during dialysis M_W_F Duration: 2 weeks total  End Date: 02/24/23   Labs weekly while on IV antibiotics: _X_ CBC with differential  X__ CMP  _X_ Vancomycin trough   Fax weekly lab results  promptly to [phone]    Call [phone] with any questions

## 2023-02-17 NOTE — Consult Note (Signed)
Pharmacy Antibiotic Note  Adam Howell is a 62 y.o. male admitted on 02/09/2023 with left leg cellulitis. PMH is significant for ESRD on HD (MWF), HTN, HLD, and T2DM. Per patient, he has been dealing with a chronic wound on his left shin for a while, but the wound has recently gotten larger with significant drainage. He notes that it has become tender to touch over the last few days and started leaking pus. Pharmacy has been consulted for vancomycin dosing.  Today, 02/17/2023 Day 9 antibiotics - vancomycin and cefepime (+ metronidazole) from linezolid and cefepime (+ metronidazole) WBC WNL Afebrile  Renal: HD MWF Blood cultures NG-final 12/3 wound culture - GPC pairs on stain  Plan: Give vancomycin 2gm IV x 1 loading dose then 1gm qHD MWF Plan to check pre-HD level with morning labs prior to 3rd or 4th dose if remains here Cefepime 2gm IV qHD MWF ID planning two weeks of vancomycin and cefepime at HD as outpatient.    Height: 6\' 3"  (190.5 cm) Weight: 106.6 kg (235 lb 0.2 oz) IBW/kg (Calculated) : 84.5  Temp (24hrs), Avg:98.6 F (37 C), Min:98 F (36.7 C), Max:99 F (37.2 C)  Recent Labs  Lab 02/11/23 0520 02/12/23 0823 02/16/23 0921 02/16/23 1554  WBC  --  6.5 7.7  --   CREATININE 10.32* 12.36* 13.73* 8.58*    Estimated Creatinine Clearance: 11.9 mL/min (A) (by C-G formula based on SCr of 8.58 mg/dL (H)).    Allergies  Allergen Reactions   Tramadol Rash   Antimicrobials this admission: Zosyn 11/26 >>  11/27 Vancomycin 11/26 >> 11/27, 12/4 >> Linezolid 11/28 >> 12/4 Cefepime 11/28 >> Metronidazole 11/28 >>  Dose adjustments this admission:  Microbiology results: See above  Thank you for allowing pharmacy to be a part of this patient's care.  Juliette Alcide, PharmD, BCPS, BCIDP Work Cell: [phone] 02/17/2023 9:20 AM

## 2023-02-17 NOTE — Progress Notes (Signed)
Hemodialysis note  Received patient in bed to unit. Alert and oriented.  Informed consent signed and in chart.  Treatment initiated:  Treatment completed: 1400  Patient tolerated well. Transported back to room, alert without acute distress.  Report given to patient's RN.   Access used: Left arm AVF Access issues: none  Total UF removed: 3L Medication(s) given:  Norco/Vicodine 2 tabs PO, Zofran 4 mg IV, Dilaudid 0.5 IV, Cefipime 2g IV Post HD VS:  Post HD weight: 103 kg   [facility]

## 2023-02-17 NOTE — Progress Notes (Addendum)
[facility]  ROUNDING NOTE   Subjective:   Adam Howell is a 62 y.o male with past medical history of diabetes, hypertension, CAD, Rt AKA, anemia, CHF, and ESRD on dialysis. Patient presents to ED with left leg pain and swelling. He has been admitted for Wound infection [T14.8XXA, L08.9] Left leg cellulitis [L03.116] Infected ulcer of skin (HCC) [L98.499, L08.9]  Patient known to our practice and receives outpatient dialysis treatments at [facility] on a MWF schedule, supervised by [facility] physicians.   Patient seen and evaluated during dialysis   HEMODIALYSIS FLOWSHEET:  Blood Flow Rate (mL/min): 400 mL/min Arterial Pressure (mmHg): -199.59 mmHg Venous Pressure (mmHg): 236.35 mmHg TMP (mmHg): 3.03 mmHg Ultrafiltration Rate (mL/min): 1267 mL/min Dialysate Flow Rate (mL/min): 300 ml/min  Tolerating dialysis today Mild lower leg pain   Objective:  Vital signs in last 24 hours:  Temp:  [98 F (36.7 C)-99 F (37.2 C)] 98.7 F (37.1 C) (12/04 1031) Pulse Rate:  [69-82] 69 (12/04 1130) Resp:  [12-21] 12 (12/04 1130) BP: (112-145)/(61-84) 123/75 (12/04 1130) SpO2:  [93 %-97 %] 94 % (12/04 1130) Weight:  [104.5 kg-106.6 kg] 104.5 kg (12/04 1031)  Weight change:  Filed Weights   02/16/23 0921 02/16/23 1237 02/17/23 1031  Weight: 107.7 kg 106.6 kg 104.5 kg    Intake/Output: I/O last 3 completed shifts: In: 240 [P.O.:240] Out: 1000 [Other:1000]   Intake/Output this shift:  No intake/output data recorded.  Physical Exam: General: NAD  Head: Normocephalic, atraumatic. Moist oral mucosal membranes  Eyes: Anicteric  Lungs:  Clear to auscultation, normal effort  Heart: Regular rate and rhythm  Abdomen:  Soft, nontender  Extremities:  No peripheral edema.  Neurologic: Alert and oriented, moving all four extremities  Skin: LLE wound, ace bandage  Access: Lt AVF    Basic Metabolic Panel: Recent Labs  Lab 02/11/23 0520 02/12/23 0823 02/16/23 0921  02/16/23 1554 02/17/23 1041  NA 134* 131* 129* 129* 132*  K 4.5 4.7 7.0* 4.6 4.6  CL 95* 91* 94* 91* 93*  CO2 26 24 21* 24 25  GLUCOSE 83 102* 101* 118* 101*  BUN 48* 63* 82* 43* 54*  CREATININE 10.32* 12.36* 13.73* 8.58* 11.02*  CALCIUM 7.9* 7.7* 8.3* 7.8* 8.2*  PHOS 4.8* 5.6* 5.8*  --  5.4*    Liver Function Tests: Recent Labs  Lab 02/11/23 0520 02/12/23 0823 02/16/23 0921 02/17/23 1041  ALBUMIN 2.7* 2.6* 2.8* 2.6*   No results for input(s): "LIPASE", "AMYLASE" in the last 168 hours. No results for input(s): "AMMONIA" in the last 168 hours.  CBC: Recent Labs  Lab 02/12/23 0823 02/16/23 0921 02/17/23 1041  WBC 6.5 7.7 6.4  HGB 11.7* 11.8* 11.7*  HCT 34.8* 35.1* 34.9*  MCV 86.8 85.2 84.5  PLT 237 253 221    Cardiac Enzymes: No results for input(s): "CKTOTAL", "CKMB", "CKMBINDEX", "TROPONINI" in the last 168 hours.  BNP: Invalid input(s): "POCBNP"  CBG: Recent Labs  Lab 02/15/23 0935 02/16/23 0751  GLUCAP 74 71    Microbiology: Results for orders placed or performed during the hospital encounter of 02/09/23  Culture, blood (routine x 2)     Status: None   Collection Time: 02/09/23 10:30 AM   Specimen: BLOOD  Result Value Ref Range Status   Specimen Description BLOOD RIGHT [facility]  Final   Special Requests BOTTLES DRAWN AEROBIC AND ANAEROBIC  Final   Culture   Final    NO GROWTH 5 DAYS Performed at [facility], [address] [place], [place] [address]    Report Status 02/14/2023 FINAL  Final  Culture, blood (routine x 2)     Status: None   Collection Time: 02/09/23  9:46 PM   Specimen: BLOOD  Result Value Ref Range Status   Specimen Description BLOOD RIGHT HAND  Final   Special Requests   Final    BOTTLES DRAWN AEROBIC AND ANAEROBIC Blood Culture adequate volume   Culture   Final    NO GROWTH 5 DAYS Performed at [facility], [address], [place], [place] [address]    Report Status 02/14/2023 FINAL  Final  Aerobic  Culture w Gram Stain (superficial specimen)     Status: None (Preliminary result)   Collection Time: 02/16/23  4:34 PM   Specimen: Leg; Wound  Result Value Ref Range Status   Specimen Description   Final    LEG Performed at [facility], [address] [place], [place] [address]    Special Requests   Final    LEFT LEG Performed at [facility], [address] [place], [place] [address]    Gram Stain NO WBC SEEN RARE GRAM POSITIVE COCCI IN PAIRS   Final   Culture   Final    NO GROWTH < 12 HOURS Performed at [facility], [address] [place], [place] [address]    Report Status PENDING  Incomplete    Coagulation Studies: No results for input(s): "LABPROT", "INR" in the last 72 hours.  Urinalysis: No results for input(s): "COLORURINE", "LABSPEC", "PHURINE", "GLUCOSEU", "HGBUR", "BILIRUBINUR", "KETONESUR", "PROTEINUR", "UROBILINOGEN", "NITRITE", "LEUKOCYTESUR" in the last 72 hours.  Invalid input(s): "APPERANCEUR"    Imaging: PERIPHERAL VASCULAR CATHETERIZATION  Result Date: 02/15/2023 See surgical note for result.    Medications:    ceFEPime (MAXIPIME) IV     vancomycin     Followed by   vancomycin      amiodarone  200 mg Oral BID   apixaban  2.5 mg Oral BID   aspirin EC  81 mg Oral Daily   calcitRIOL  1.5 mcg Oral Q M,W,F-1800   Chlorhexidine Gluconate Cloth  6 each Topical Q0600   cinacalcet  30 mg Oral Q breakfast   docusate sodium  100 mg Oral BID   leptospermum manuka honey  1 Application Topical Daily   melatonin  2.5 mg Oral QHS   metroNIDAZOLE  500 mg Oral Q12H   multivitamin  1 tablet Oral QHS   pregabalin  25 mg Oral Daily   rosuvastatin  40 mg Oral Daily   senna  2 tablet Oral QHS   sevelamer carbonate  2,400 mg Oral TID WC   acetaminophen **OR** acetaminophen, hydrALAZINE, HYDROcodone-acetaminophen, HYDROmorphone (DILAUDID) injection, LORazepam, ondansetron **OR** ondansetron (ZOFRAN) IV  Assessment/ Plan:  Mr.  Adam Howell is a 62 y.o.  male with past medical history of diabetes, hypertension, CAD, Rt AKA, anemia, CHF, and ESRD on dialysis. Patient presents to ED with left leg pain and swelling. He has been admitted for Wound infection [T14.8XXA, L08.9] Left leg cellulitis [L03.116] Infected ulcer of skin (HCC) [L98.499, L08.9]  [facility]/MWF/Lt AVF  End stage renal disease on hemodialysis.  Received treatment yesterday with UF 1L achieved. Receiving treatment today, UF goal 3L as tolerated. Next treatment scheduled for Friday.   2. Anemia of chronic kidney disease Lab Results  Component Value Date   HGB 11.7 (L) 02/17/2023    Hemoglobin at goal. No need for ESA.   3.  Wound infection, Left leg. Imaging negative for osteomyelitis. Receiving cefepime, Flagyl, linezolid.  Vascular ABI study normal. Vascular surgery performed angioplasty with intervention on 02/15/2023.  ID recommends Cefepime 1g and Vancomycin 1g with each dialysis until 02/24/23. Outpatient clinic notified.  4. Secondary Hyperparathyroidism: with outpatient labs: PTH 558, phosphorus 7.0, calcium 8.2 on 01/20/23.   Lab Results  Component Value Date   CALCIUM 8.2 (L) 02/17/2023   PHOS 5.4 (H) 02/17/2023    Prescribed calcitriol, cinacalcet and sevelamer outpatient.  Will continue to monitor bone minerals.    LOS: 8 Adam Howell 12/4/202411:49 AM

## 2023-02-17 NOTE — Progress Notes (Signed)
OT Cancellation Note  Patient Details Name: Adam Howell MRN: [id number] DOB: [birth year]   Cancelled Treatment:    Reason Eval/Treat Not Completed: Patient at procedure or test/ unavailable. Pt off the floor for dialysis, will check back at later date/time as available.  [name] [name] 02/17/2023, 10:22 AM

## 2023-02-17 NOTE — Progress Notes (Signed)
PT Cancellation Note  Patient Details Name: Adam Howell MRN: [id number] DOB: 11/21/1960   Cancelled Treatment:    Reason Eval/Treat Not Completed: Other (comment). Pt currently leaving room for HD. Will re-attempt next available time.   Keana Dueitt 02/17/2023, 10:12 AM Elizabeth Palau, PT, DPT, GCS [phone]

## 2023-02-18 DIAGNOSIS — L089 Local infection of the skin and subcutaneous tissue, unspecified: Secondary | ICD-10-CM | POA: Diagnosis not present

## 2023-02-18 DIAGNOSIS — T148XXA Other injury of unspecified body region, initial encounter: Secondary | ICD-10-CM | POA: Diagnosis not present

## 2023-02-18 DIAGNOSIS — L03116 Cellulitis of left lower limb: Secondary | ICD-10-CM | POA: Diagnosis not present

## 2023-02-18 LAB — CBC WITH DIFFERENTIAL/PLATELET
Abs Immature Granulocytes: 0.04 10*3/uL (ref 0.00–0.07)
Basophils Absolute: 0.1 10*3/uL (ref 0.0–0.1)
Basophils Relative: 1 %
Eosinophils Absolute: 0.4 10*3/uL (ref 0.0–0.5)
Eosinophils Relative: 6 %
HCT: 39 % (ref 39.0–52.0)
Hemoglobin: 12.8 g/dL — ABNORMAL LOW (ref 13.0–17.0)
Immature Granulocytes: 1 %
Lymphocytes Relative: 18 %
Lymphs Abs: 1.1 10*3/uL (ref 0.7–4.0)
MCH: 28.5 pg (ref 26.0–34.0)
MCHC: 32.8 g/dL (ref 30.0–36.0)
MCV: 86.9 fL (ref 80.0–100.0)
Monocytes Absolute: 0.6 10*3/uL (ref 0.1–1.0)
Monocytes Relative: 10 %
Neutro Abs: 4 10*3/uL (ref 1.7–7.7)
Neutrophils Relative %: 64 %
Platelets: 207 10*3/uL (ref 150–400)
RBC: 4.49 MIL/uL (ref 4.22–5.81)
RDW: 15.1 % (ref 11.5–15.5)
WBC: 6.2 10*3/uL (ref 4.0–10.5)
nRBC: 0 % (ref 0.0–0.2)

## 2023-02-18 LAB — BASIC METABOLIC PANEL
Anion gap: 13 (ref 5–15)
BUN: 38 mg/dL — ABNORMAL HIGH (ref 8–23)
CO2: 26 mmol/L (ref 22–32)
Calcium: 8.1 mg/dL — ABNORMAL LOW (ref 8.9–10.3)
Chloride: 93 mmol/L — ABNORMAL LOW (ref 98–111)
Creatinine, Ser: 8.67 mg/dL — ABNORMAL HIGH (ref 0.61–1.24)
GFR, Estimated: 6 mL/min — ABNORMAL LOW (ref >60)
Glucose, Bld: 75 mg/dL (ref 70–99)
Potassium: 4.4 mmol/L (ref 3.5–5.1)
Sodium: 132 mmol/L — ABNORMAL LOW (ref 135–145)

## 2023-02-18 NOTE — Progress Notes (Signed)
Progress Note   Patient: Adam Howell ZOX:[id number] DOB: 1960/05/06 DOA: 02/09/2023     9 DOS: the patient was seen and examined on 02/18/2023     Brief hospital course: Adam Howell is 62 y.o. male male w/ PMH ESRD on HD MWF, anemia chronic kidney disease, type 2 diabetes, chronic pain syndrome, chronic HFrEF, a/p R AKA, HLD, HTN, CAD.  Presents to the emergency department from home with C/L left leg pain and swelling x 3 to 4 days, foul odor to wound and leaking fluid for past few days.  Has been seeing wound care for this, recently treated outpatient by his PCP with doxycycline but wound has gotten worse.    Patient is seen by vascular team, advised ABI and he had angiogram with percutaneous transluminal angioplasty of left posterior tibial artery and left peroneal artery on Monday. He is on broad spectrum IV antibiotic therapy. ID called for antibiotic regimen.   Assessment and Plan: Ulcer left lower extremity with infection. Patient does have draining, worsening pain. MRI left lower extremity negative for osteomyelitis. Cultures 12/24/22 - Staph, enterococcus, enterobacter. Continue vancomycin and cefepime as recommended by ID Vascular surgery team performed angiogram with percutaneous transluminal angioplasty of left posterior tibial artery and left peroneal artery on Monday.  PT OT has recommended skilled nursing facility [facility] working on this   ESRD on hemodialysis His usual schedule is Monday, Wednesday and Friday. Nephrology on board for dialysis needs.  Continue dialysis needs   Hyperkalemia Improved after hemodialysis   Chronic atrial fibrillation: Continue amiodarone, Eliquis therapy.   Chronic diastolic CHF No exacerbation. GDMT limited due to low blood pressures.   Chronic pain syndrome: Continue home dose opiates, Lyrica therapy. Continue IV Dilaudid decreased to 0.5 q4, continue norco as needed.   Type 2 diabetes mellitus: A1c 5.9. sugars  stable. He wishes regular diet, changed to renal.   Chronic constipation: Due to chronic opiate use. Laxative discontinued as patient is having diarrhea   Obesity with BMI 32.5: Diet, exercise and weight reduction advised.   Out of bed to chair. Incentive spirometry. Nursing supportive care. Fall, aspiration precautions. DVT prophylaxis   Code Status: Full Code   Subjective:  Patient seen and examined at bedside this morning Admits to some diarrhea after receiving laxative, which has now been discontinued Denies abdominal pain or vomiting,    Physical Exam: General - Elderly obese African American male, no apparent distress HEENT - PERRLA, EOMI, atraumatic head, non tender sinuses. Lung - Clear, diffuse rales, rhonchi, no wheezes. Heart - S1, S2 heard, no murmurs, rubs, 1+ pedal edema. Abdomen - Soft, non tender obese, bowel sounds good Neuro - sleeping, arousable, non focal exam. Skin - Warm and dry. Right AKA, Left arm fistula, Left leg dressing noted.     Family Communication: Discussed with patient, he understand and agree. All questions answereed.   Disposition: Status is: Inpatient Remains inpatient appropriate because: wound infection, IV antibiotics. Vascular, ID follow up.  Planned Discharge Destination: Home with Home Health and Rehab     Time spent: 38 minutes   Data Reviewed:    Latest Ref Rng & Units 02/18/2023    5:25 AM 02/17/2023   10:41 AM 02/16/2023    3:54 PM  BMP  Glucose 70 - 99 mg/dL 75  811  914   BUN 8 - 23 mg/dL 38  54  43   Creatinine 0.61 - 1.24 mg/dL 7.82  95.62  1.30   Sodium 135 - 145 mmol/L  132  132  129   Potassium 3.5 - 5.1 mmol/L 4.4  4.6  4.6   Chloride 98 - 111 mmol/L 93  93  91   CO2 22 - 32 mmol/L 26  25  24    Calcium 8.9 - 10.3 mg/dL 8.1  8.2  7.8         Latest Ref Rng & Units 02/18/2023    5:25 AM 02/17/2023   10:41 AM 02/16/2023    9:21 AM  CBC  WBC 4.0 - 10.5 K/uL 6.2  6.4  7.7   Hemoglobin 13.0 - 17.0 g/dL 09.8   11.9  14.7   Hematocrit 39.0 - 52.0 % 39.0  34.9  35.1   Platelets 150 - 400 K/uL 207  221  253     Vitals:   02/17/23 1507 02/17/23 2322 02/18/23 0317 02/18/23 0750  BP: 127/70 120/69 139/78 138/70  Pulse: 83 74 82 74  Resp: 16 18 16 17   Temp: 97.9 F (36.6 C) 97.6 F (36.4 C) 98.2 F (36.8 C) 98.7 F (37.1 C)  TempSrc:  Oral Oral   SpO2: 98% 99% 92% 95%  Weight:      Height:       Author: Loyce Dys, MD 02/18/2023 5:52 PM  For on call review [email or web address].

## 2023-02-18 NOTE — Evaluation (Signed)
Physical Therapy Evaluation Patient Details Name: Nefi Melrose MRN: [id number] DOB: 1960/12/25 Today's Date: 02/18/2023  History of Present Illness  62 y.o. male presented to the ED from home with left leg pain and swelling and a wound with foul discharge. He has had a wound for many months now. S/p angiogram with percutaneous transluminal angioplasty of L posterior tibial artery and left peroneal artery on Monday.PMHx: ESRD on dialysis, Anemia, a-fib, CHF, chronic pain, DM, Chronic pain syndrome, Rt AKA, HTN CAD.   Clinical Impression  Co-treat with OT. Pt received in bed and agreed to PT session. Pt reported pain in left leg to be on a scale of 8/10. Pt performed bed mobility ModI, lateral scoot transfer to the [facility] on the left side CGA+2 for AD/pt safety management, STS with the use of RW (2wheels) CGA, and stand pivot transfer back to bed on the right side CGA+2 for AD management/safety purposes. Pt reported the way in which they performed all transfers today is the way they performed transfers at home/prior to admission. Pt did not report an increase in pain during or after mobility. Pt is currently not at their baseline as they are unable to amb or perform stair negotiation due to decreased strength, balance, and overall functional mobility. As a result, pt is unable to perform mobility appropriately or safely in order to get in/out of their home. Pt tolerated Tx well and will continue to benefit from skilled PT sessions to improve strength, functional mobility, and activity tolerance to maximize safety/return to PLOF following D/C.         If plan is discharge home, recommend the following: A lot of help with walking and/or transfers;Help with stairs or ramp for entrance;Assist for transportation   Can travel by private vehicle   Yes    Equipment Recommendations None recommended by PT  Recommendations for Other Services       Functional Status Assessment Patient has had a recent  decline in their functional status and demonstrates the ability to make significant improvements in function in a reasonable and predictable amount of time.     Precautions / Restrictions Precautions Precautions: Fall Restrictions Weight Bearing Restrictions: No      Mobility  Bed Mobility Overal bed mobility: Modified Independent             General bed mobility comments: Pt performed bed mobility ModI and did not report any s/sx relative to dizziness when sitting EOB    Transfers Overall transfer level: Needs assistance Equipment used: Rolling walker (2 wheels) (Wheelchair) Transfers: Bed to chair/wheelchair/BSC, Sit to/from Stand Sit to Stand: Contact guard assist Stand pivot transfers: Contact guard assist, +2 safety/equipment        Lateral/Scoot Transfers: Contact guard assist, +2 safety/equipment General transfer comment: CGAx2 for lateral scoot transfer from bed to W/C; STS from W/C with CGA x1 to RW; stand pivot back to bed from W/C on R side with CGA x2    Ambulation/Gait               General Gait Details: deferred  Stairs            Wheelchair Mobility     Tilt Bed    Modified Rankin (Stroke Patients Only)       Balance Overall balance assessment: Needs assistance Sitting-balance support: Single extremity supported, Feet supported Sitting balance-Leahy Scale: Good     Standing balance support: Bilateral upper extremity supported, Reliant on assistive device for balance, During functional activity  Standing balance-Leahy Scale: Fair                               Pertinent Vitals/Pain Pain Assessment Pain Assessment: 0-10 Pain Score: 8  Pain Location: LLE-foot Pain Descriptors / Indicators: Sore, Aching Pain Intervention(s): Monitored during session    Home Living Family/patient expects to be discharged to:: Private residence Living Arrangements: Other relatives (Niece) Available Help at Discharge:  Family;Available 24 hours/day Type of Home: Apartment Home Access: Stairs to enter Entrance Stairs-Rails: Can reach both;Left;Right Entrance Stairs-Number of Steps: 3rd level apartment with 3 sets of steps to go up/down for entry/exit   Home Layout: One level Home Equipment: Agricultural consultant (2 wheels);Rollator (4 wheels);Tub bench;Hospital bed;Wheelchair - manual Additional Comments: reports he lives with his niece and that they are in the process of moving from a 3rd level apartment to a ground level 2 story townhouse?    Prior Function Prior Level of Function : Needs assist             Mobility Comments: reports able to transfer bed<>W/C MOD I via lat scoot; was hopping on LLE up/down 3 sets of steps for dialysis; mostly in W/C in the home ADLs Comments: assist for shower and toilet transfers from niece, otherwise MOD I     Extremity/Trunk Assessment   Upper Extremity Assessment Upper Extremity Assessment: Overall WFL for tasks assessed    Lower Extremity Assessment Lower Extremity Assessment: Generalized weakness       Communication   Communication Communication: No apparent difficulties Cueing Techniques: Verbal cues  Cognition Arousal: Alert Behavior During Therapy: WFL for tasks assessed/performed Overall Cognitive Status: Within Functional Limits for tasks assessed                                 General Comments: AOx4. Pt pleasant and willing to participate in PT session.        General Comments General comments (skin integrity, edema, etc.): LLE wrapping in place    Exercises     Assessment/Plan    PT Assessment Patient needs continued PT services  PT Problem List Decreased strength;Decreased activity tolerance;Decreased mobility;Pain       PT Treatment Interventions Gait training;Functional mobility training;Therapeutic activities;Therapeutic exercise;Balance training    PT Goals (Current goals can be found in the Care Plan section)   Acute Rehab PT Goals Patient Stated Goal: No goals stated PT Goal Formulation: With patient Time For Goal Achievement: 03/04/23 Potential to Achieve Goals: Good    Frequency Min 1X/week     Co-evaluation PT/OT/SLP Co-Evaluation/Treatment: Yes Reason for Co-Treatment: For patient/therapist safety;To address functional/ADL transfers PT goals addressed during session: Mobility/safety with mobility OT goals addressed during session: ADL's and self-care       AM-PAC PT "6 Clicks" Mobility  Outcome Measure Help needed turning from your back to your side while in a flat bed without using bedrails?: None Help needed moving from lying on your back to sitting on the side of a flat bed without using bedrails?: A Little Help needed moving to and from a bed to a chair (including a wheelchair)?: A Little Help needed standing up from a chair using your arms (e.g., wheelchair or bedside chair)?: A Little Help needed to walk in hospital room?: A Lot Help needed climbing 3-5 steps with a railing? : A Lot 6 Click Score: 17    End  of Session   Activity Tolerance: Patient tolerated treatment well;Patient limited by pain Patient left: in bed;with call bell/phone within reach;with bed alarm set Nurse Communication: Mobility status PT Visit Diagnosis: Other abnormalities of gait and mobility (R26.89);Muscle weakness (generalized) (M62.81);Difficulty in walking, not elsewhere classified (R26.2);Pain Pain - Right/Left: Left Pain - part of body: Leg    Time: 1011-1030 PT Time Calculation (min) (ACUTE ONLY): 19 min   Charges:   PT Evaluation $PT Eval Low Complexity: 1 Low   PT General Charges $$ ACUTE PT VISIT: 1 Visit         Hermenegildo Clausen Sauvignon Howard SPT, LAT, ATC  Ankith Edmonston Sauvignon-Howard 02/18/2023, 2:06 PM

## 2023-02-18 NOTE — TOC Progression Note (Signed)
Transition of Care [facility]) - Progression Note    Patient Details  Name: Adam Howell MRN: [id number] Date of Birth: 12-03-1960  Transition of [facility]) CM/SW Contact  Chapman Fitch, RN Phone Number: 02/18/2023, 7:15 PM  Clinical Narrative:        Therapy recommending SNF Patient agreeable to bed search Existing pasrr Fl2 sent for signature Bed search initiated     Expected Discharge Plan and Services                                               Social Determinants of Health (SDOH) Interventions SDOH Screenings   Food Insecurity: No Food Insecurity (02/09/2023)  Housing: Low Risk  (02/09/2023)  Transportation Needs: No Transportation Needs (02/09/2023)  Recent Concern: Transportation Needs - Unmet Transportation Needs (12/29/2022)  Utilities: Not At Risk (02/09/2023)  Alcohol Screen: Low Risk  (11/25/2021)  Depression (PHQ2-9): Low Risk  (12/29/2022)  Financial Resource Strain: Low Risk  (11/25/2021)  Physical Activity: Insufficiently Active (11/25/2021)  Social Connections: Moderately Isolated (12/29/2022)  Stress: No Stress Concern Present (11/25/2021)  Tobacco Use: Low Risk  (02/09/2023)    Readmission Risk Interventions    02/16/2023    6:21 PM  Readmission Risk Prevention Plan  Transportation Screening Complete  Palliative Care Screening Not Applicable  Medication Review (RN Care Manager) Complete

## 2023-02-18 NOTE — Evaluation (Signed)
Occupational Therapy Evaluation Patient Details Name: Adam Howell MRN: [id number] DOB: March 15, 1961 Today's Date: 02/18/2023   History of Present Illness 62 y.o. male presented to the ED from home with left leg pain and swelling and a wound with foul discharge. He has had a wound for many months now. S/p angiogram with percutaneous transluminal angioplasty of L posterior tibial artery and left peroneal artery on Monday.PMHx: ESRD on dialysis, Anemia, a-fib, CHF, chronic pain, DM, Chronic pain syndrome, Rt AKA, HTN CAD.   Clinical Impression   Pt was seen for PT/OT co-evaluation this date to maximize pt/therapist safety. Prior to hospital admission, pt was living with his niece in a 3rd level apartment with 3 sets of steps to ascend/descend to enter/exit. Reports having assist from his niece for toilet and tub transfers, but otherwise MOD I for lateral scoot/SPT bed<>W/C and for dressing/bathing. Able to proper W/C throughout the home.   Pt presents to acute OT demonstrating impaired ADL performance and functional mobility 2/2 weakness, pain, balance deficits (See OT problem list for additional functional deficits). He reports 8/10 L foot pain. Does not have prosthetic in room, but reports had just began working with PT at home on walking/standing with it. Pt currently requires MOD I for bed mobility. He demo lateral scoot transfer from bed to W/C with CGAx2. STS from W/C with CGA x1 to RW with limited standing tolerance d/t pain in L foot. Pt performed stand pivot back to bed from W/C on R side with CGA x2 and less safe with this transfer. He would not be able to hop up multiple sets of steps at this time d/t his L foot pain with inability to hop d/t very limited standing tolerance from L foot pain on eval. Pt would benefit from skilled OT services to address noted impairments and functional limitations (see below for any additional details) in order to maximize safety and independence while  minimizing falls risk and caregiver burden. Do anticipate the need for follow up OT services upon acute hospital DC.        If plan is discharge home, recommend the following: A little help with walking and/or transfers;A little help with bathing/dressing/bathroom;Assistance with cooking/housework;Assist for transportation;Help with stairs or ramp for entrance    Functional Status Assessment  Patient has had a recent decline in their functional status and demonstrates the ability to make significant improvements in function in a reasonable and predictable amount of time.  Equipment Recommendations  None recommended by OT    Recommendations for Other Services       Precautions / Restrictions Precautions Precautions: Fall Restrictions Weight Bearing Restrictions: No      Mobility Bed Mobility Overal bed mobility: Modified Independent                  Transfers Overall transfer level: Needs assistance   Transfers: Bed to chair/wheelchair/BSC, Sit to/from Stand Sit to Stand: Contact guard assist Stand pivot transfers: Contact guard assist, +2 safety/equipment        Lateral/Scoot Transfers: Contact guard assist, +2 safety/equipment General transfer comment: CGAx2 for lateral scoot transfer from bed to W/C; STS from W/C with CGA x1 to RW; stand pivot back to bed from W/C on R side with CGA x2      Balance Overall balance assessment: Needs assistance Sitting-balance support: Single extremity supported, Feet supported Sitting balance-Leahy Scale: Good Sitting balance - Comments: steady balance seated EOB   Standing balance support: Bilateral upper extremity supported, Reliant on assistive  device for balance, During functional activity Standing balance-Leahy Scale: Fair Standing balance comment: CGA for STS balance at RW                           ADL either performed or assessed with clinical judgement   ADL Overall ADL's : Needs assistance/impaired                      Lower Body Dressing: Bed level;Independent Lower Body Dressing Details (indicate cue type and reason): to don sock on L foot Toilet Transfer: Contact guard assist;+2 for safety/equipment Toilet Transfer Details (indicate cue type and reason): simulated-bed<>chair                 Vision         Perception         Praxis         Pertinent Vitals/Pain Pain Assessment Pain Assessment: 0-10 Pain Score: 8  Pain Location: LLE-foot Pain Descriptors / Indicators: Sore, Aching Pain Intervention(s): Monitored during session, Limited activity within patient's tolerance     Extremity/Trunk Assessment Upper Extremity Assessment Upper Extremity Assessment: Overall WFL for tasks assessed   Lower Extremity Assessment Lower Extremity Assessment: Generalized weakness       Communication Communication Communication: No apparent difficulties Cueing Techniques: Verbal cues   Cognition Arousal: Alert Behavior During Therapy: WFL for tasks assessed/performed Overall Cognitive Status: Within Functional Limits for tasks assessed                                       General Comments  LLE wrapping in place    Exercises Other Exercises Other Exercises: Edu in role of OT in acute setting and importance of therapy to maximize pt strength/IND.   Shoulder Instructions      Home Living Family/patient expects to be discharged to:: Private residence Living Arrangements: Other relatives (niece) Available Help at Discharge: Family;Available 24 hours/day Type of Home: Apartment Home Access: Stairs to enter [employer] of Steps: 3rd level apartment with 3 sets of steps to go up/down for entry/exit Entrance Stairs-Rails: Can reach both;Left;Right Home Layout: One level     Bathroom Shower/Tub: Tub/shower unit;Curtain   Firefighter: Standard     Home Equipment: Agricultural consultant (2 wheels);Rollator (4 wheels);Tub  bench;Hospital bed;Wheelchair - manual   Additional Comments: reports he lives with his niece and that they are in the process of moving from a 3rd level apartment to a ground level 2 story townhouse?      Prior Functioning/Environment Prior Level of Function : Needs assist             Mobility Comments: reports able to transfer bed<>W/C MOD I via lat scoot; was hopping on LLE up/down 3 sets of steps for dialysis; mostly in W/C in the home ADLs Comments: assist for shower and toilet transfers from niece, otherwise MOD I        OT Problem List: Decreased strength;Pain;Impaired balance (sitting and/or standing)      OT Treatment/Interventions: Self-care/ADL training;Therapeutic exercise;Patient/family education;Balance training;Therapeutic activities    OT Goals(Current goals can be found in the care plan section) Acute Rehab OT Goals Patient Stated Goal: improve strength and pain to return to PLOF OT Goal Formulation: With patient Time For Goal Achievement: 03/04/23 Potential to Achieve Goals: Good ADL Goals Pt Will Perform Lower Body Bathing:  with supervision;sitting/lateral leans;sit to/from stand Pt Will Perform Lower Body Dressing: with supervision;sit to/from stand;sitting/lateral leans Pt Will Transfer to Toilet: squat pivot transfer;regular height toilet;grab bars Pt Will Perform Toileting - Clothing Manipulation and hygiene: with supervision;sit to/from stand;sitting/lateral leans  OT Frequency: Min 1X/week    Co-evaluation PT/OT/SLP Co-Evaluation/Treatment: Yes Reason for Co-Treatment: For patient/therapist safety;To address functional/ADL transfers PT goals addressed during session: Mobility/safety with mobility OT goals addressed during session: ADL's and self-care      AM-PAC OT "6 Clicks" Daily Activity     Outcome Measure Help from another person eating meals?: None Help from another person taking care of personal grooming?: None Help from another person  toileting, which includes using toliet, bedpan, or urinal?: A Little Help from another person bathing (including washing, rinsing, drying)?: A Little Help from another person to put on and taking off regular upper body clothing?: None Help from another person to put on and taking off regular lower body clothing?: A Little 6 Click Score: 21   End of Session Equipment Utilized During Treatment: Gait belt;Rolling walker (2 wheels);Other (comment) (manual W/C)  Activity Tolerance: Patient tolerated treatment well Patient left: in bed;with call bell/phone within reach;with bed alarm set  OT Visit Diagnosis: Other abnormalities of gait and mobility (R26.89);Unsteadiness on feet (R26.81);Muscle weakness (generalized) (M62.81)                Time: 1011-1030 OT Time Calculation (min): 19 min Charges:  OT General Charges $OT Visit: 1 Visit OT Evaluation $OT Eval Low Complexity: 1 Low Samia Kukla, OTR/L 02/18/23, 12:14 PM  Ovide Dusek E Zafir Schauer 02/18/2023, 12:10 PM

## 2023-02-18 NOTE — Progress Notes (Signed)
[facility]  ROUNDING NOTE   Subjective:   Adam Howell is a 62 y.o male with past medical history of diabetes, hypertension, CAD, Rt AKA, anemia, CHF, and ESRD on dialysis. Patient presents to ED with left leg pain and swelling. He has been admitted for Wound infection [T14.8XXA, L08.9] Left leg cellulitis [L03.116] Infected ulcer of skin (HCC) [L98.499, L08.9]  Patient known to our practice and receives outpatient dialysis treatments at [facility] on a MWF schedule, supervised by [facility] physicians.   Patient seen laying in bed, eating breakfast Complains of pain from left lower extremity Received pain medication from RN  Awaiting rehab placement.  Objective:  Vital signs in last 24 hours:  Temp:  [97.6 F (36.4 C)-98.7 F (37.1 C)] 98.7 F (37.1 C) (12/05 0750) Pulse Rate:  [74-83] 74 (12/05 0750) Resp:  [16-20] 17 (12/05 0750) BP: (102-139)/(64-78) 138/70 (12/05 0750) SpO2:  [92 %-99 %] 95 % (12/05 0750) Weight:  [103 kg] 103 kg (12/04 1400)  Weight change: -3.2 kg Filed Weights   02/16/23 1237 02/17/23 1031 02/17/23 1400  Weight: 106.6 kg 104.5 kg 103 kg    Intake/Output: I/O last 3 completed shifts: In: -  Out: 3003 [Other:3000; Stool:3]   Intake/Output this shift:  No intake/output data recorded.  Physical Exam: General: NAD  Head: Normocephalic, atraumatic. Moist oral mucosal membranes  Eyes: Anicteric  Lungs:  Clear to auscultation, normal effort  Heart: Regular rate and rhythm  Abdomen:  Soft, nontender  Extremities:  No peripheral edema.  Neurologic: Alert and oriented, moving all four extremities  Skin: LLE wound, ace bandage  Access: Lt AVF    Basic Metabolic Panel: Recent Labs  Lab 02/12/23 0823 02/16/23 0921 02/16/23 1554 02/17/23 1041 02/18/23 0525  NA 131* 129* 129* 132* 132*  K 4.7 7.0* 4.6 4.6 4.4  CL 91* 94* 91* 93* 93*  CO2 24 21* 24 25 26   GLUCOSE 102* 101* 118* 101* 75  BUN 63* 82* 43* 54* 38*   CREATININE 12.36* 13.73* 8.58* 11.02* 8.67*  CALCIUM 7.7* 8.3* 7.8* 8.2* 8.1*  PHOS 5.6* 5.8*  --  5.4*  --     Liver Function Tests: Recent Labs  Lab 02/12/23 0823 02/16/23 0921 02/17/23 1041  ALBUMIN 2.6* 2.8* 2.6*   No results for input(s): "LIPASE", "AMYLASE" in the last 168 hours. No results for input(s): "AMMONIA" in the last 168 hours.  CBC: Recent Labs  Lab 02/12/23 0823 02/16/23 0921 02/17/23 1041 02/18/23 0525  WBC 6.5 7.7 6.4 6.2  NEUTROABS  --   --   --  4.0  HGB 11.7* 11.8* 11.7* 12.8*  HCT 34.8* 35.1* 34.9* 39.0  MCV 86.8 85.2 84.5 86.9  PLT 237 253 221 207    Cardiac Enzymes: No results for input(s): "CKTOTAL", "CKMB", "CKMBINDEX", "TROPONINI" in the last 168 hours.  BNP: Invalid input(s): "POCBNP"  CBG: Recent Labs  Lab 02/15/23 0935 02/16/23 0751  GLUCAP 74 71    Microbiology: Results for orders placed or performed during the hospital encounter of 02/09/23  Culture, blood (routine x 2)     Status: None   Collection Time: 02/09/23 10:30 AM   Specimen: BLOOD  Result Value Ref Range Status   Specimen Description BLOOD RIGHT [facility]  Final   Special Requests BOTTLES DRAWN AEROBIC AND ANAEROBIC  Final   Culture   Final    NO GROWTH 5 DAYS Performed at [facility], [address], [place], [place] [address]    Report  Status 02/14/2023 FINAL  Final  Culture, blood (routine x 2)     Status: None   Collection Time: 02/09/23  9:46 PM   Specimen: BLOOD  Result Value Ref Range Status   Specimen Description BLOOD RIGHT HAND  Final   Special Requests   Final    BOTTLES DRAWN AEROBIC AND ANAEROBIC Blood Culture adequate volume   Culture   Final    NO GROWTH 5 DAYS Performed at [facility], [address], [place], [place] [address]    Report Status 02/14/2023 FINAL  Final  Aerobic Culture w Gram Stain (superficial specimen)     Status: None (Preliminary result)   Collection Time: 02/16/23  4:34 PM   Specimen: Leg; Wound   Result Value Ref Range Status   Specimen Description   Final    LEG Performed at [facility], [address] [place], [place] [address]    Special Requests   Final    LEFT LEG Performed at [facility], [address] [place], [place] [address]    Gram Stain NO WBC SEEN RARE GRAM POSITIVE COCCI IN PAIRS   Final   Culture   Final    CULTURE REINCUBATED FOR BETTER GROWTH Performed at [facility], [address], [place], [place] [address]    Report Status PENDING  Incomplete    Coagulation Studies: No results for input(s): "LABPROT", "INR" in the last 72 hours.  Urinalysis: No results for input(s): "COLORURINE", "LABSPEC", "PHURINE", "GLUCOSEU", "HGBUR", "BILIRUBINUR", "KETONESUR", "PROTEINUR", "UROBILINOGEN", "NITRITE", "LEUKOCYTESUR" in the last 72 hours.  Invalid input(s): "APPERANCEUR"    Imaging: No results found.   Medications:    ceFEPime (MAXIPIME) IV Stopped (02/17/23 1343)   [START ON 02/19/2023] vancomycin      amiodarone  200 mg Oral BID   apixaban  2.5 mg Oral BID   aspirin EC  81 mg Oral Daily   calcitRIOL  1.5 mcg Oral Q M,W,F-1800   Chlorhexidine Gluconate Cloth  6 each Topical Q0600   cinacalcet  30 mg Oral Q breakfast   docusate sodium  100 mg Oral BID   leptospermum manuka honey  1 Application Topical Daily   melatonin  2.5 mg Oral QHS   multivitamin  1 tablet Oral QHS   pregabalin  25 mg Oral Daily   rosuvastatin  40 mg Oral Daily   senna  2 tablet Oral QHS   sevelamer carbonate  2,400 mg Oral TID WC   acetaminophen **OR** acetaminophen, hydrALAZINE, HYDROcodone-acetaminophen, HYDROmorphone (DILAUDID) injection, LORazepam, ondansetron **OR** ondansetron (ZOFRAN) IV  Assessment/ Plan:  Adam Howell is a 62 y.o.  male with past medical history of diabetes, hypertension, CAD, Rt AKA, anemia, CHF, and ESRD on dialysis. Patient presents to ED with left leg pain and swelling. He has been admitted for Wound  infection [T14.8XXA, L08.9] Left leg cellulitis [L03.116] Infected ulcer of skin (HCC) [L98.499, L08.9]  [facility]/MWF/Lt AVF  End stage renal disease on hemodialysis.  Received dialysis yesterday, UF 3L achieved. Next treatment scheduled for Friday  2. Anemia of chronic kidney disease Lab Results  Component Value Date   HGB 12.8 (L) 02/18/2023    Hemoglobin at goal. No need for ESA.   3. Wound infection, Left leg. Imaging negative for osteomyelitis. We feel wound is more likely arterial insuffiencey and not due to calciphylaxis.  Receiving cefepime and vancomycin.  Vascular ABI study normal. Vascular surgery performed angioplasty with intervention on 02/15/2023.  ID recommends  Cefepime 2g and Vancomycin 1g with each dialysis until 02/24/23. Outpatient clinic notified.  4. Secondary Hyperparathyroidism: with outpatient labs: PTH 558, phosphorus 7.0, calcium 8.2 on 01/20/23.   Lab Results  Component Value Date   CALCIUM 8.1 (L) 02/18/2023   PHOS 5.4 (H) 02/17/2023    Prescribed calcitriol, cinacalcet and sevelamer outpatient. All these medications continued during this admission.     LOS: 9 Ashe Graybeal 12/5/20241:13 PM

## 2023-02-18 NOTE — TOC Progression Note (Signed)
Transition of Care [facility]) - Progression Note    Patient Details  Name: Adam Howell MRN: [id number] Date of Birth: Feb 14, 1961  Transition of Care [facility]) CM/SW Contact  Chapman Fitch, RN Phone Number: 02/18/2023, 9:47 AM  Clinical Narrative:      PT/OT evals pending. Will need IV abx until 12/11.  Antibiotics to be given at HD       Expected Discharge Plan and Services                                               Social Determinants of Health (SDOH) Interventions SDOH Screenings   Food Insecurity: No Food Insecurity (02/09/2023)  Housing: Low Risk  (02/09/2023)  Transportation Needs: No Transportation Needs (02/09/2023)  Recent Concern: Transportation Needs - Unmet Transportation Needs (12/29/2022)  Utilities: Not At Risk (02/09/2023)  Alcohol Screen: Low Risk  (11/25/2021)  Depression (PHQ2-9): Low Risk  (12/29/2022)  Financial Resource Strain: Low Risk  (11/25/2021)  Physical Activity: Insufficiently Active (11/25/2021)  Social Connections: Moderately Isolated (12/29/2022)  Stress: No Stress Concern Present (11/25/2021)  Tobacco Use: Low Risk  (02/09/2023)    Readmission Risk Interventions    02/16/2023    6:21 PM  Readmission Risk Prevention Plan  Transportation Screening Complete  Palliative Care Screening Not Applicable  Medication Review (RN Care Manager) Complete

## 2023-02-18 NOTE — Plan of Care (Signed)

## 2023-02-18 NOTE — NC FL2 (Signed)
[name] MEDICAID FL2 LEVEL OF CARE FORM     IDENTIFICATION  Patient Name: Adam Howell Birthdate: 1960/05/06 Sex: male Admission Date (Current Location): 02/09/2023  [facility] and [place] Number:  Chiropodist and Address:         Provider Number: [id number]  Attending Physician Name and Address:  Loyce Dys, MD  Relative Name and Phone Number:       Current Level of Care: Hospital Recommended Level of Care: Skilled Nursing Facility Prior Approval Number:    Date Approved/Denied:   PASRR Number: [id number]  Discharge Plan: SNF    Current Diagnoses: Patient Active Problem List   Diagnosis Date Noted   Infected ulcer of skin (HCC) 02/09/2023   Ulcer of left lower extremity, limited to breakdown of skin (HCC) 12/10/2022   S/P AKA (above knee amputation), right (HCC) 03/15/2022   Hypotension 03/08/2022   Wound infection of chronic right lower extremity ulcer 01/13/2022   Controlled substance agreement signed 04/10/2021   Other thrombophilia (HCC) 01/19/2020   Left leg pain 12/25/2019   Atrial fibrillation, chronic (HCC) 11/11/2019   Hyperlipidemia associated with type 2 diabetes mellitus (HCC) 11/11/2019   Erectile dysfunction 11/11/2019   Heart failure with reduced ejection fraction (HCC) 10/26/2019   Type 2 diabetes mellitus with ESRD (end-stage renal disease) (HCC) 10/04/2019   Chronic pain syndrome 10/04/2019   Insomnia 10/04/2019   Atherosclerotic heart disease of native coronary artery without angina pectoris 09/01/2019   Hypertensive heart and kidney disease with heart failure and chronic kidney disease stage V (HCC) 09/01/2019   Obesity (BMI 30-39.9) 09/01/2019   Anemia in chronic kidney disease 07/25/2019   ESRD on dialysis (HCC) 07/25/2019   Secondary hyperparathyroidism of renal origin (HCC) 07/25/2019    Orientation RESPIRATION BLADDER Height & Weight     Self, Time, Situation, Place  Normal  (anuric) Weight: 103 kg Height:   6\' 3"  (190.5 cm)  BEHAVIORAL SYMPTOMS/MOOD NEUROLOGICAL BOWEL NUTRITION STATUS      Continent Diet (renal 1200 fluid restriction)  AMBULATORY STATUS COMMUNICATION OF NEEDS Skin   Limited Assist Verbally Other (Comment) (Open area on LLE)                       Personal Care Assistance Level of Assistance              Functional Limitations Info             SPECIAL CARE FACTORS FREQUENCY  PT (By licensed PT), OT (By licensed OT)                    Contractures      Additional Factors Info  Code Status, Allergies Code Status Info: full Allergies Info: tramadol           Current Medications (02/18/2023):  This is the current hospital active medication list Current Facility-Administered Medications  Medication Dose Route Frequency Provider Last Rate Last Admin   acetaminophen (TYLENOL) tablet 650 mg  650 mg Oral Q6H PRN Annice Needy, MD   650 mg at 02/16/23 6578   Or   acetaminophen (TYLENOL) suppository 650 mg  650 mg Rectal Q6H PRN Annice Needy, MD       amiodarone (PACERONE) tablet 200 mg  200 mg Oral BID Annice Needy, MD   200 mg at 02/18/23 4696   apixaban (ELIQUIS) tablet 2.5 mg  2.5 mg Oral BID Annice Needy, MD   2.5  mg at 02/18/23 0837   aspirin EC tablet 81 mg  81 mg Oral Daily Annice Needy, MD   81 mg at 02/18/23 5621   calcitRIOL (ROCALTROL) capsule 1.5 mcg  1.5 mcg Oral Q M,W,F-1800 Annice Needy, MD   1.5 mcg at 02/17/23 1555   ceFEPIme (MAXIPIME) 2 g in sodium chloride 0.9 % 100 mL IVPB  2 g Intravenous Q M,W,F-HD Aleda Grana, RPH   Stopped at 02/17/23 1343   Chlorhexidine Gluconate Cloth 2 % PADS 6 each  6 each Topical Q0600 Annice Needy, MD   6 each at 02/18/23 0501   cinacalcet (SENSIPAR) tablet 30 mg  30 mg Oral Q breakfast Annice Needy, MD   30 mg at 02/18/23 3086   hydrALAZINE (APRESOLINE) injection 5 mg  5 mg Intravenous Q4H PRN Annice Needy, MD       HYDROcodone-acetaminophen (NORCO/VICODIN) 5-325 MG per tablet 2 tablet  2 tablet Oral  Q4H PRN Annice Needy, MD   2 tablet at 02/17/23 2036   HYDROmorphone (DILAUDID) injection 0.5 mg  0.5 mg Intravenous Q4H PRN [name], MD   0.5 mg at 02/18/23 1739   leptospermum manuka honey (MEDIHONEY) paste 1 Application  1 Application Topical Daily Annice Needy, MD   1 Application at 02/18/23 1243   LORazepam (ATIVAN) injection 1 mg  1 mg Intravenous Once PRN Annice Needy, MD       melatonin tablet 2.5 mg  2.5 mg Oral QHS Annice Needy, MD   2.5 mg at 02/17/23 2037   multivitamin (RENA-VIT) tablet 1 tablet  1 tablet Oral QHS Annice Needy, MD   1 tablet at 02/17/23 2037   ondansetron (ZOFRAN) tablet 4 mg  4 mg Oral Q6H PRN Annice Needy, MD       Or   ondansetron [facility]) injection 4 mg  4 mg Intravenous Q6H PRN Annice Needy, MD   4 mg at 02/17/23 1142   pregabalin (LYRICA) capsule 25 mg  25 mg Oral Daily Annice Needy, MD   25 mg at 02/18/23 0837   rosuvastatin (CRESTOR) tablet 40 mg  40 mg Oral Daily Annice Needy, MD   40 mg at 02/18/23 5784   sevelamer carbonate (RENVELA) tablet 2,400 mg  2,400 mg Oral TID WC Annice Needy, MD   2,400 mg at 02/18/23 1742   [START ON 02/19/2023] vancomycin (VANCOCIN) IVPB 1000 mg/200 mL premix  1,000 mg Intravenous Q M,W,F-HD Aleda Grana, [facility]         Discharge Medications: Please see discharge summary for a list of discharge medications.  Relevant Imaging Results:  Relevant Lab Results:   Additional Information SS#: [id number]. [facility] MWF 7:00 am.  Chapman Fitch, RN

## 2023-02-19 DIAGNOSIS — T148XXA Other injury of unspecified body region, initial encounter: Secondary | ICD-10-CM | POA: Diagnosis not present

## 2023-02-19 DIAGNOSIS — L089 Local infection of the skin and subcutaneous tissue, unspecified: Secondary | ICD-10-CM | POA: Diagnosis not present

## 2023-02-19 DIAGNOSIS — L03116 Cellulitis of left lower limb: Secondary | ICD-10-CM | POA: Diagnosis not present

## 2023-02-19 LAB — CBC WITH DIFFERENTIAL/PLATELET
Abs Immature Granulocytes: 0.03 10*3/uL (ref 0.00–0.07)
Basophils Absolute: 0.1 10*3/uL (ref 0.0–0.1)
Basophils Relative: 1 %
Eosinophils Absolute: 0.4 10*3/uL (ref 0.0–0.5)
Eosinophils Relative: 6 %
HCT: 39.3 % (ref 39.0–52.0)
Hemoglobin: 12.9 g/dL — ABNORMAL LOW (ref 13.0–17.0)
Immature Granulocytes: 0 %
Lymphocytes Relative: 18 %
Lymphs Abs: 1.2 10*3/uL (ref 0.7–4.0)
MCH: 28.3 pg (ref 26.0–34.0)
MCHC: 32.8 g/dL (ref 30.0–36.0)
MCV: 86.2 fL (ref 80.0–100.0)
Monocytes Absolute: 0.5 10*3/uL (ref 0.1–1.0)
Monocytes Relative: 8 %
Neutro Abs: 4.5 10*3/uL (ref 1.7–7.7)
Neutrophils Relative %: 67 %
Platelets: 212 10*3/uL (ref 150–400)
RBC: 4.56 MIL/uL (ref 4.22–5.81)
RDW: 15 % (ref 11.5–15.5)
WBC: 6.8 10*3/uL (ref 4.0–10.5)
nRBC: 0 % (ref 0.0–0.2)

## 2023-02-19 LAB — BASIC METABOLIC PANEL
Anion gap: 12 (ref 5–15)
BUN: 48 mg/dL — ABNORMAL HIGH (ref 8–23)
CO2: 26 mmol/L (ref 22–32)
Calcium: 8.2 mg/dL — ABNORMAL LOW (ref 8.9–10.3)
Chloride: 94 mmol/L — ABNORMAL LOW (ref 98–111)
Creatinine, Ser: 10.35 mg/dL — ABNORMAL HIGH (ref 0.61–1.24)
GFR, Estimated: 5 mL/min — ABNORMAL LOW (ref >60)
Glucose, Bld: 95 mg/dL (ref 70–99)
Potassium: 4.5 mmol/L (ref 3.5–5.1)
Sodium: 132 mmol/L — ABNORMAL LOW (ref 135–145)

## 2023-02-19 MED ORDER — HEPARIN SODIUM (PORCINE) 1000 UNIT/ML DIALYSIS: 1000.0000 [IU] | INTRAMUSCULAR | Status: DC | PRN

## 2023-02-19 MED ORDER — HYDROMORPHONE HCL 1 MG/ML IJ SOLN
INTRAMUSCULAR | Status: AC
  Filled 2023-02-19: qty 1

## 2023-02-19 MED ORDER — LIDOCAINE-PRILOCAINE 2.5-2.5 % EX CREA: 1.0000 | TOPICAL_CREAM | CUTANEOUS | Status: DC | PRN

## 2023-02-19 MED ORDER — PENTAFLUOROPROP-TETRAFLUOROETH EX AERO: 1.0000 | INHALATION_SPRAY | CUTANEOUS | Status: DC | PRN

## 2023-02-19 MED ORDER — PENTAFLUOROPROP-TETRAFLUOROETH EX AERO
INHALATION_SPRAY | CUTANEOUS | Status: AC
  Filled 2023-02-19: qty 30

## 2023-02-19 NOTE — Progress Notes (Signed)
Received patient in bed to unit.    Informed consent signed and in chart.    TX duration: 3.5 hrs     Transported back to floor  Hand-off given to patient's nurse.   Access used:  lt avf Access issues: n/a  Total UF removed: 3000 mls Medication(s) given: Dilaudid 0.5mg  IV       Maple Hudson, RN Dialysis Unit

## 2023-02-19 NOTE — Consult Note (Signed)
Pharmacy Antibiotic Note  Adam Howell is a 62 y.o. male admitted on 02/09/2023 with left leg cellulitis. PMH is significant for ESRD on HD (MWF), HTN, HLD, and T2DM. Per patient, he has been dealing with a chronic wound on his left shin for a while, but the wound has recently gotten larger with significant drainage. He notes that it has become tender to touch over the last few days and started leaking pus. Pharmacy has been consulted for vancomycin dosing.  Today, 02/19/2023 Day 10 antibiotics - vancomycin and cefepime from linezolid and cefepime (+ metronidazole) WBC WNL Afebrile  Renal: HD MWF Blood cultures NG-final 12/3 wound culture - GPC pairs on stain Received vancomycin loading dose 12/4  Plan: Continue vancomycin 1gm qHD MWF Do not anticipate need to check levels as last dose is 12/11 Cefepime 2gm IV qHD MWF OPAT placed to receive above antibiotics at HD with last dose 12/11.  Nephrology aware of this plan.   Height: 6\' 3"  (190.5 cm) Weight: 101.9 kg (224 lb 10.4 oz) IBW/kg (Calculated) : 84.5  Temp (24hrs), Avg:98.2 F (36.8 C), Min:97.8 F (36.6 C), Max:98.6 F (37 C)  Recent Labs  Lab 02/16/23 0921 02/16/23 1554 02/17/23 1041 02/18/23 0525 02/19/23 0424  WBC 7.7  --  6.4 6.2 6.8  CREATININE 13.73* 8.58* 11.02* 8.67* 10.35*    Estimated Creatinine Clearance: 9.7 mL/min (A) (by C-G formula based on SCr of 10.35 mg/dL (H)).    Allergies  Allergen Reactions   Tramadol Rash   Antimicrobials this admission: Zosyn 11/26 >>  11/27 Vancomycin 11/26 >> 11/27, 12/4 >> Linezolid 11/28 >> 12/4 Cefepime 11/28 >> Metronidazole 11/28 >>12/4  Dose adjustments this admission:  Microbiology results: See above  Thank you for allowing pharmacy to be a part of this patient's care.  Juliette Alcide, PharmD, BCPS, BCIDP Work Cell: [phone] 02/19/2023 9:41 AM

## 2023-02-19 NOTE — Progress Notes (Signed)
Progress Note   Patient: Adam Howell ZOX:[id number] DOB: 01/12/1961 DOA: 02/09/2023     10 DOS: the patient was seen and examined on 02/19/2023     Brief hospital course: Adam Howell is 62 y.o. male male w/ PMH ESRD on HD MWF, anemia chronic kidney disease, type 2 diabetes, chronic pain syndrome, chronic HFrEF, a/p R AKA, HLD, HTN, CAD.  Presents to the emergency department from home with C/L left leg pain and swelling x 3 to 4 days, foul odor to wound and leaking fluid for past few days.  Has been seeing wound care for this, recently treated outpatient by his PCP with doxycycline but wound has gotten worse.    Patient is seen by vascular team, advised ABI and he had angiogram with percutaneous transluminal angioplasty of left posterior tibial artery and left peroneal artery on Monday. He is on broad spectrum IV antibiotic therapy. ID called for antibiotic regimen.   Assessment and Plan: Ulcer left lower extremity with infection. Patient does have draining, worsening pain. MRI left lower extremity negative for osteomyelitis. Cultures 12/24/22 - Staph, enterococcus, enterobacter. Continue vancomycin and cefepime as recommended by ID Vascular surgery team performed angiogram with percutaneous transluminal angioplasty of left posterior tibial artery and left peroneal artery on Monday.  PT OT has recommended skilled nursing facility [facility] working on placement   ESRD on hemodialysis His usual schedule is Monday, Wednesday and Friday. Nephrology on board for dialysis needs.  Continue dialysis needs   Hyperkalemia Improved after hemodialysis   Chronic atrial fibrillation: Continue amiodarone, Eliquis therapy.   Chronic diastolic CHF No exacerbation. GDMT limited due to low blood pressures.   Chronic pain syndrome: Continue home dose opiates, Lyrica therapy. Continue IV Dilaudid decreased to 0.5 q4, continue norco as needed.   Type 2 diabetes mellitus: A1c 5.9. sugars  stable. He wishes regular diet, changed to renal.   Chronic constipation: Due to chronic opiate use. Laxative discontinued as patient is having diarrhea   Obesity with BMI 32.5: Diet, exercise and weight reduction advised.    DVT prophylaxis   Code Status: Full Code   Subjective:  Admits to improvement in diarrhea Complains of feeling tired essentially did not sleep well overnight Denies nausea vomiting abdominal pain  Physical Exam: General - Elderly obese African American male, no apparent distress HEENT - PERRLA, EOMI, atraumatic head, non tender sinuses. Lung - Clear, diffuse rales, rhonchi, no wheezes. Heart - S1, S2 heard, no murmurs, rubs, 1+ pedal edema. Abdomen - Soft, non tender obese, bowel sounds good Neuro - sleeping, arousable, non focal exam. Skin - Warm and dry. Right AKA, Left arm fistula, Left leg dressing noted.     Family Communication: Discussed with patient, he understand and agree. All questions answereed.   Disposition: Status is: Inpatient Remains inpatient appropriate because: wound infection, IV antibiotics. Vascular, ID follow up.  Planned Discharge Destination: Home with Home Health and Rehab     Time spent: 36 minutes   Data Reviewed:    Latest Ref Rng & Units 02/19/2023    4:24 AM 02/18/2023    5:25 AM 02/17/2023   10:41 AM  CBC  WBC 4.0 - 10.5 K/uL 6.8  6.2  6.4   Hemoglobin 13.0 - 17.0 g/dL 81.1  91.4  78.2   Hematocrit 39.0 - 52.0 % 39.3  39.0  34.9   Platelets 150 - 400 K/uL 212  207  221        Latest Ref Rng & Units 02/19/2023  4:24 AM 02/18/2023    5:25 AM 02/17/2023   10:41 AM  BMP  Glucose 70 - 99 mg/dL 95  75  295   BUN 8 - 23 mg/dL 48  38  54   Creatinine 0.61 - 1.24 mg/dL 62.13  0.86  57.84   Sodium 135 - 145 mmol/L 132  132  132   Potassium 3.5 - 5.1 mmol/L 4.5  4.4  4.6   Chloride 98 - 111 mmol/L 94  93  93   CO2 22 - 32 mmol/L 26  26  25    Calcium 8.9 - 10.3 mg/dL 8.2  8.1  8.2     Vitals:   02/19/23 1230  02/19/23 1300 02/19/23 1314 02/19/23 1439  BP: 124/85 107/63 108/84 137/81  Pulse: 85 88 85 79  Resp: 14 16 (!) 21 18  Temp:   98 F (36.7 C)   TempSrc:   Oral Oral  SpO2: 96% 96% 97% 99%  Weight:      Height:        Author: Loyce Dys, MD 02/19/2023 4:35 PM  For on call review [email or web address].

## 2023-02-19 NOTE — TOC Progression Note (Addendum)
Transition of Care [facility]) - Progression Note    Patient Details  Name: Adam Howell MRN: [id number] Date of Birth: 1960/12/07  [facility] CM/SW Contact  Chapman Fitch, RN Phone Number: 02/19/2023, 2:55 PM  Clinical Narrative:        Presented bed offers to patient Patient accepts bed at [facility]  Accepted in HUB, notified Tanya at [facility] Auth pending in navi portal Message sent to [facility] to determine if patient could admit over the weekend if auth returns    Update Per Kenney Houseman they would not be able to admit over the weekend would have to be Monday after HD.  Per Merry Proud with [facility] patient is active with RN and PT   Expected Discharge Plan and Services                                               Social Determinants of Health (SDOH) Interventions SDOH Screenings   Food Insecurity: No Food Insecurity (02/09/2023)  Housing: Low Risk  (02/09/2023)  Transportation Needs: No Transportation Needs (02/09/2023)  Recent Concern: Transportation Needs - Unmet Transportation Needs (12/29/2022)  Utilities: Not At Risk (02/09/2023)  Alcohol Screen: Low Risk  (11/25/2021)  Depression (PHQ2-9): Low Risk  (12/29/2022)  Financial Resource Strain: Low Risk  (11/25/2021)  Physical Activity: Insufficiently Active (11/25/2021)  Social Connections: Moderately Isolated (12/29/2022)  Stress: No Stress Concern Present (11/25/2021)  Tobacco Use: Low Risk  (02/09/2023)    Readmission Risk Interventions    02/16/2023    6:21 PM  Readmission Risk Prevention Plan  Transportation Screening Complete  Palliative Care Screening Not Applicable  Medication Review (RN Care Manager) Complete

## 2023-02-19 NOTE — Progress Notes (Signed)
[facility]  ROUNDING NOTE   Subjective:   Adam Howell is a 62 y.o male with past medical history of diabetes, hypertension, CAD, Rt AKA, anemia, CHF, and ESRD on dialysis. Patient presents to ED with left leg pain and swelling. He has been admitted for Wound infection [T14.8XXA, L08.9] Left leg cellulitis [L03.116] Infected ulcer of skin (HCC) [L98.499, L08.9]  Patient known to our practice and receives outpatient dialysis treatments at [facility] on a MWF schedule, supervised by [facility] physicians.   Patient seen and evaluated during dialysis   HEMODIALYSIS FLOWSHEET:  Blood Flow Rate (mL/min): 400 mL/min Arterial Pressure (mmHg): -207.66 mmHg Venous Pressure (mmHg): 245.44 mmHg TMP (mmHg): 3.63 mmHg Ultrafiltration Rate (mL/min): 1000 mL/min Dialysate Flow Rate (mL/min): 299 ml/min  Tolerating treatment well  Objective:  Vital signs in last 24 hours:  Temp:  [97.8 F (36.6 C)-98.6 F (37 C)] 98.6 F (37 C) (12/06 0914) Pulse Rate:  [56-82] 56 (12/06 1100) Resp:  [10-22] 22 (12/06 1100) BP: (112-142)/(68-78) 118/69 (12/06 1100) SpO2:  [90 %-99 %] 90 % (12/06 1100) Weight:  [101.9 kg] 101.9 kg (12/06 0914)  Weight change:  Filed Weights   02/17/23 1031 02/17/23 1400 02/19/23 0914  Weight: 104.5 kg 103 kg 101.9 kg    Intake/Output: I/O last 3 completed shifts: In: 460 [P.O.:360; IV Piggyback:100] Out: 3 [Stool:3]   Intake/Output this shift:  No intake/output data recorded.  Physical Exam: General: NAD  Head: Normocephalic, atraumatic. Moist oral mucosal membranes  Eyes: Anicteric  Lungs:  Clear to auscultation, normal effort  Heart: Regular rate and rhythm  Abdomen:  Soft, nontender  Extremities:  No peripheral edema.  Neurologic: Alert and oriented, moving all four extremities  Skin: LLE wound, ace bandage  Access: Lt AVF    Basic Metabolic Panel: Recent Labs  Lab 02/16/23 0921 02/16/23 1554 02/17/23 1041 02/18/23 0525  02/19/23 0424  NA 129* 129* 132* 132* 132*  K 7.0* 4.6 4.6 4.4 4.5  CL 94* 91* 93* 93* 94*  CO2 21* 24 25 26 26   GLUCOSE 101* 118* 101* 75 95  BUN 82* 43* 54* 38* 48*  CREATININE 13.73* 8.58* 11.02* 8.67* 10.35*  CALCIUM 8.3* 7.8* 8.2* 8.1* 8.2*  PHOS 5.8*  --  5.4*  --   --     Liver Function Tests: Recent Labs  Lab 02/16/23 0921 02/17/23 1041  ALBUMIN 2.8* 2.6*   No results for input(s): "LIPASE", "AMYLASE" in the last 168 hours. No results for input(s): "AMMONIA" in the last 168 hours.  CBC: Recent Labs  Lab 02/16/23 0921 02/17/23 1041 02/18/23 0525 02/19/23 0424  WBC 7.7 6.4 6.2 6.8  NEUTROABS  --   --  4.0 4.5  HGB 11.8* 11.7* 12.8* 12.9*  HCT 35.1* 34.9* 39.0 39.3  MCV 85.2 84.5 86.9 86.2  PLT 253 221 207 212    Cardiac Enzymes: No results for input(s): "CKTOTAL", "CKMB", "CKMBINDEX", "TROPONINI" in the last 168 hours.  BNP: Invalid input(s): "POCBNP"  CBG: Recent Labs  Lab 02/15/23 0935 02/16/23 0751  GLUCAP 74 71    Microbiology: Results for orders placed or performed during the hospital encounter of 02/09/23  Culture, blood (routine x 2)     Status: None   Collection Time: 02/09/23 10:30 AM   Specimen: BLOOD  Result Value Ref Range Status   Specimen [facility]  Final   Special Requests BOTTLES DRAWN AEROBIC AND ANAEROBIC  Final   Culture   Final  NO GROWTH 5 DAYS Performed at [facility], [address] [place], [place] [address]    Report Status 02/14/2023 FINAL  Final  Culture, blood (routine x 2)     Status: None   Collection Time: 02/09/23  9:46 PM   Specimen: BLOOD  Result Value Ref Range Status   Specimen Description BLOOD RIGHT HAND  Final   Special Requests   Final    BOTTLES DRAWN AEROBIC AND ANAEROBIC Blood Culture adequate volume   Culture   Final    NO GROWTH 5 DAYS Performed at [facility], [address] [place], [place] [address]    Report Status 02/14/2023 FINAL  Final   Aerobic Culture w Gram Stain (superficial specimen)     Status: None (Preliminary result)   Collection Time: 02/16/23  4:34 PM   Specimen: Leg; Wound  Result Value Ref Range Status   Specimen Description   Final    LEG Performed at [facility], [address] [place], [place] [address]    Special Requests   Final    LEFT LEG Performed at [facility], [address] [place], [place] [address]    Gram Stain NO WBC SEEN RARE GRAM POSITIVE COCCI IN PAIRS   Final   Culture   Final    RARE ENTEROCOCCUS FAECALIS SUSCEPTIBILITIES TO FOLLOW Performed at [facility], [address], [place], [place] [address]    Report Status PENDING  Incomplete    Coagulation Studies: No results for input(s): "LABPROT", "INR" in the last 72 hours.  Urinalysis: No results for input(s): "COLORURINE", "LABSPEC", "PHURINE", "GLUCOSEU", "HGBUR", "BILIRUBINUR", "KETONESUR", "PROTEINUR", "UROBILINOGEN", "NITRITE", "LEUKOCYTESUR" in the last 72 hours.  Invalid input(s): "APPERANCEUR"    Imaging: No results found.   Medications:    ceFEPime (MAXIPIME) IV Stopped (02/17/23 1343)   vancomycin      amiodarone  200 mg Oral BID   apixaban  2.5 mg Oral BID   aspirin EC  81 mg Oral Daily   calcitRIOL  1.5 mcg Oral Q M,W,F-1800   Chlorhexidine Gluconate Cloth  6 each Topical Q0600   cinacalcet  30 mg Oral Q breakfast   leptospermum manuka honey  1 Application Topical Daily   melatonin  2.5 mg Oral QHS   multivitamin  1 tablet Oral QHS   pregabalin  25 mg Oral Daily   rosuvastatin  40 mg Oral Daily   sevelamer carbonate  2,400 mg Oral TID WC   acetaminophen **OR** acetaminophen, heparin, hydrALAZINE, HYDROcodone-acetaminophen, HYDROmorphone (DILAUDID) injection, lidocaine-prilocaine, LORazepam, ondansetron **OR** ondansetron (ZOFRAN) IV, pentafluoroprop-tetrafluoroeth  Assessment/ Plan:  Adam Howell is a 62 y.o.  male with past medical history of diabetes,  hypertension, CAD, Rt AKA, anemia, CHF, and ESRD on dialysis. Patient presents to ED with left leg pain and swelling. He has been admitted for Wound infection [T14.8XXA, L08.9] Left leg cellulitis [L03.116] Infected ulcer of skin (HCC) [L98.499, L08.9]  [facility] [place]/MWF/Lt AVF  End stage renal disease on hemodialysis.  Since receiving scheduled dialysis treatment today, UF goal 3 L as tolerated.  Next treatment scheduled for Monday.  2. Anemia of chronic kidney disease Lab Results  Component Value Date   HGB 12.9 (L) 02/19/2023   Will continue to monitor hemoglobin and assess need for ESA's.  3. Wound infection, Left leg. Imaging negative for osteomyelitis. We feel wound is more likely arterial insuffiencey and not due to calciphylaxis.  Receiving cefepime and vancomycin.  Vascular ABI study  normal. Vascular surgery performed angioplasty with intervention on 02/15/2023.  ID recommends Cefepime 2g and Vancomycin 1g with each dialysis until 02/24/23. Outpatient clinic notified.  4. Secondary Hyperparathyroidism: with outpatient labs: PTH 558, phosphorus 7.0, calcium 8.2 on 01/20/23.   Lab Results  Component Value Date   CALCIUM 8.2 (L) 02/19/2023   PHOS 5.4 (H) 02/17/2023    Prescribed calcitriol, cinacalcet and sevelamer outpatient.  Will continue to monitor bone minerals.   LOS: 10 Isamar Wellbrock 12/6/202411:53 AM

## 2023-02-19 NOTE — Plan of Care (Signed)

## 2023-02-20 DIAGNOSIS — T148XXA Other injury of unspecified body region, initial encounter: Secondary | ICD-10-CM | POA: Diagnosis not present

## 2023-02-20 DIAGNOSIS — L03116 Cellulitis of left lower limb: Secondary | ICD-10-CM | POA: Diagnosis not present

## 2023-02-20 DIAGNOSIS — L089 Local infection of the skin and subcutaneous tissue, unspecified: Secondary | ICD-10-CM | POA: Diagnosis not present

## 2023-02-20 LAB — CBC WITH DIFFERENTIAL/PLATELET
Abs Immature Granulocytes: 0.02 10*3/uL (ref 0.00–0.07)
Basophils Absolute: 0.1 10*3/uL (ref 0.0–0.1)
Basophils Relative: 1 %
Eosinophils Absolute: 0.3 10*3/uL (ref 0.0–0.5)
Eosinophils Relative: 5 %
HCT: 40.2 % (ref 39.0–52.0)
Hemoglobin: 13.3 g/dL (ref 13.0–17.0)
Immature Granulocytes: 0 %
Lymphocytes Relative: 21 %
Lymphs Abs: 1.4 10*3/uL (ref 0.7–4.0)
MCH: 28.1 pg (ref 26.0–34.0)
MCHC: 33.1 g/dL (ref 30.0–36.0)
MCV: 84.8 fL (ref 80.0–100.0)
Monocytes Absolute: 0.6 10*3/uL (ref 0.1–1.0)
Monocytes Relative: 9 %
Neutro Abs: 4.3 10*3/uL (ref 1.7–7.7)
Neutrophils Relative %: 64 %
Platelets: 197 10*3/uL (ref 150–400)
RBC: 4.74 MIL/uL (ref 4.22–5.81)
RDW: 14.7 % (ref 11.5–15.5)
WBC: 6.7 10*3/uL (ref 4.0–10.5)
nRBC: 0 % (ref 0.0–0.2)

## 2023-02-20 LAB — BASIC METABOLIC PANEL
Anion gap: 11 (ref 5–15)
BUN: 34 mg/dL — ABNORMAL HIGH (ref 8–23)
CO2: 27 mmol/L (ref 22–32)
Calcium: 8.4 mg/dL — ABNORMAL LOW (ref 8.9–10.3)
Chloride: 95 mmol/L — ABNORMAL LOW (ref 98–111)
Creatinine, Ser: 8.24 mg/dL — ABNORMAL HIGH (ref 0.61–1.24)
GFR, Estimated: 7 mL/min — ABNORMAL LOW (ref >60)
Glucose, Bld: 95 mg/dL (ref 70–99)
Potassium: 4 mmol/L (ref 3.5–5.1)
Sodium: 133 mmol/L — ABNORMAL LOW (ref 135–145)

## 2023-02-20 LAB — AEROBIC CULTURE W GRAM STAIN (SUPERFICIAL SPECIMEN): Gram Stain: NONE SEEN

## 2023-02-20 LAB — HEPATITIS B SURFACE ANTIBODY, QUANTITATIVE: Hep B S AB Quant (Post): 217 m[IU]/mL

## 2023-02-20 NOTE — Progress Notes (Addendum)
[facility]  ROUNDING NOTE   Subjective:  Adam Howell is a 62 y.o male with past medical history of diabetes, hypertension, CAD, Rt AKA, anemia, CHF, and ESRD on dialysis. Patient presents to ED with left leg pain and swelling. He has been admitted for Wound infection [T14.8XXA, L08.9] Left leg cellulitis [L03.116] Infected ulcer of skin (HCC) [L98.499, L08.9]   Patient known to our practice and receives outpatient dialysis treatments at [facility] on a MWF schedule, supervised by [facility] physicians.   Update: Patient presented in bed resting comfortably. Denies shortness of breath, denies pain.     Objective:  Vital signs in last 24 hours:  Temp:  [98.4 F (36.9 C)-98.6 F (37 C)] 98.4 F (36.9 C) (12/07 1457) Pulse Rate:  [67-76] 72 (12/07 1457) Resp:  [16-18] 18 (12/07 1457) BP: (121-149)/(74-117) 135/86 (12/07 1457) SpO2:  [98 %-100 %] 99 % (12/07 1457)  Weight change:  Filed Weights   02/17/23 1031 02/17/23 1400 02/19/23 0914  Weight: 104.5 kg 103 kg 101.9 kg    Intake/Output: I/O last 3 completed shifts: In: 780 [P.O.:480; IV Piggyback:300] Out: 3000 [Other:3000]   Intake/Output this shift:  No intake/output data recorded.  Physical Exam: General: NAD,   Head: Normocephalic, atraumatic. Moist oral mucosal membranes  Eyes: Anicteric, PERRL  Neck: Supple, trachea midline  Lungs:  Clear to auscultation, on room air  Heart: Regular rate and rhythm  Abdomen:  Soft, nontender,   Extremities: No peripheral edema.  Neurologic: Nonfocal, moving all four extremities  Skin: LLE wound, ace bandage  Access: Left AVF    Basic Metabolic Panel: Recent Labs  Lab 02/16/23 0921 02/16/23 1554 02/17/23 1041 02/18/23 0525 02/19/23 0424 02/20/23 0457  NA 129* 129* 132* 132* 132* 133*  K 7.0* 4.6 4.6 4.4 4.5 4.0  CL 94* 91* 93* 93* 94* 95*  CO2 21* 24 25 26 26 27   GLUCOSE 101* 118* 101* 75 95 95  BUN 82* 43* 54* 38* 48* 34*  CREATININE 13.73*  8.58* 11.02* 8.67* 10.35* 8.24*  CALCIUM 8.3* 7.8* 8.2* 8.1* 8.2* 8.4*  PHOS 5.8*  --  5.4*  --   --   --     Liver Function Tests: Recent Labs  Lab 02/16/23 0921 02/17/23 1041  ALBUMIN 2.8* 2.6*   No results for input(s): "LIPASE", "AMYLASE" in the last 168 hours. No results for input(s): "AMMONIA" in the last 168 hours.  CBC: Recent Labs  Lab 02/16/23 0921 02/17/23 1041 02/18/23 0525 02/19/23 0424 02/20/23 0457  WBC 7.7 6.4 6.2 6.8 6.7  NEUTROABS  --   --  4.0 4.5 4.3  HGB 11.8* 11.7* 12.8* 12.9* 13.3  HCT 35.1* 34.9* 39.0 39.3 40.2  MCV 85.2 84.5 86.9 86.2 84.8  PLT 253 221 207 212 197    Cardiac Enzymes: No results for input(s): "CKTOTAL", "CKMB", "CKMBINDEX", "TROPONINI" in the last 168 hours.  BNP: Invalid input(s): "POCBNP"  CBG: Recent Labs  Lab 02/15/23 0935 02/16/23 0751  GLUCAP 74 71    Microbiology: Results for orders placed or performed during the hospital encounter of 02/09/23  Culture, blood (routine x 2)     Status: None   Collection Time: 02/09/23 10:30 AM   Specimen: BLOOD  Result Value Ref Range Status   Specimen Description BLOOD RIGHT [facility]  Final   Special Requests BOTTLES DRAWN AEROBIC AND ANAEROBIC  Final   Culture   Final    NO GROWTH 5 DAYS Performed at [facility], [address], [place], [place] [address]    Report Status 02/14/2023 FINAL  Final  Culture, blood (routine x 2)     Status: None   Collection Time: 02/09/23  9:46 PM   Specimen: BLOOD  Result Value Ref Range Status   Specimen Description BLOOD RIGHT HAND  Final   Special Requests   Final    BOTTLES DRAWN AEROBIC AND ANAEROBIC Blood Culture adequate volume   Culture   Final    NO GROWTH 5 DAYS Performed at [facility], [address], [place], [place] [address]    Report Status 02/14/2023 FINAL  Final  Aerobic Culture w Gram Stain (superficial specimen)     Status: None   Collection Time: 02/16/23  4:34 PM   Specimen: Leg; Wound  Result  Value Ref Range Status   Specimen Description LEG  Final   Special Requests LEFT LEG  Final   Gram Stain NO WBC SEEN RARE GRAM POSITIVE COCCI IN PAIRS   Final   Culture   Final    RARE ENTEROCOCCUS FAECALIS RARE CORYNEBACTERIUM STRIATUM Standardized susceptibility testing for this organism is not available.    Report Status 02/20/2023 FINAL  Final   Organism ID, Bacteria ENTEROCOCCUS FAECALIS  Final      Susceptibility   Enterococcus faecalis - MIC*    AMPICILLIN <=2 SENSITIVE Sensitive     VANCOMYCIN 1 SENSITIVE Sensitive     GENTAMICIN SYNERGY RESISTANT Resistant     * RARE ENTEROCOCCUS FAECALIS    Coagulation Studies: No results for input(s): "LABPROT", "INR" in the last 72 hours.  Urinalysis: No results for input(s): "COLORURINE", "LABSPEC", "PHURINE", "GLUCOSEU", "HGBUR", "BILIRUBINUR", "KETONESUR", "PROTEINUR", "UROBILINOGEN", "NITRITE", "LEUKOCYTESUR" in the last 72 hours.  Invalid input(s): "APPERANCEUR"    Imaging: No results found.   Medications:    ceFEPime (MAXIPIME) IV Stopped (02/19/23 2024)   vancomycin Stopped (02/19/23 1400)    amiodarone  200 mg Oral BID   apixaban  2.5 mg Oral BID   aspirin EC  81 mg Oral Daily   calcitRIOL  1.5 mcg Oral Q M,W,F-1800   Chlorhexidine Gluconate Cloth  6 each Topical Q0600   cinacalcet  30 mg Oral Q breakfast   leptospermum manuka honey  1 Application Topical Daily   melatonin  2.5 mg Oral QHS   multivitamin  1 tablet Oral QHS   pregabalin  25 mg Oral Daily   rosuvastatin  40 mg Oral Daily   sevelamer carbonate  2,400 mg Oral TID WC   acetaminophen **OR** acetaminophen, hydrALAZINE, HYDROcodone-acetaminophen, HYDROmorphone (DILAUDID) injection, LORazepam, ondansetron **OR** ondansetron (ZOFRAN) IV  Assessment/ Plan:  [name] is a 62 y.o.  male  with past medical history of diabetes, hypertension, CAD, Rt AKA, anemia, CHF, and ESRD on dialysis. Patient presents to ED with left leg pain and  swelling. He has been admitted for Wound infection [T14.8XXA, L08.9] Left leg cellulitis [L03.116] Infected ulcer of skin (HCC) [L98.499, L08.9]   [facility] /MWF/Lt AVF   End stage renal disease on hemodialysis. Last treatment Friday, 3 L removed.  Next treatment scheduled for Monday.   2. Anemia of chronic kidney disease  12/7 HgB 13.3 Will continue to monitor hemoglobin and assess need for ESA's.   3. Wound infection, Left leg. Imaging negative for osteomyelitis. We feel wound is more likely arterial insuffiencey and not due to calciphylaxis.  Receiving cefepime and vancomycin.  Vascular ABI study normal. Vascular surgery performed angioplasty with intervention on 02/15/2023.  ID recommends  Cefepime 2g and Vancomycin 1g with each dialysis until 02/24/23. Outpatient clinic notified.   4. Secondary Hyperparathyroidism: with outpatient labs: PTH 558, phosphorus 7.0, calcium 8.2 on 01/20/23.    Recent Labs       Lab Results  Component Value Date    CALCIUM 8.4 (L) 02/20/2023    PHOS 5.4 (H) 02/17/2023      Prescribed calcitriol, cinacalcet and sevelamer outpatient.  Will continue to monitor bone minerals.   LOS: 11 Adam Howell 12/7/20243:39 PM

## 2023-02-20 NOTE — Plan of Care (Signed)
Patient without distress. Medicated for pain as ordered with relief. Will continue to monitor.   Problem: Education: Goal: Knowledge of General Education information will improve Description: Including pain rating scale, medication(s)/side effects and non-pharmacologic comfort measures 02/20/2023 0444 by Ramon Dredge, RN Outcome: Progressing 02/20/2023 0444 by Ramon Dredge, RN Outcome: Progressing   Problem: Health Behavior/Discharge Planning: Goal: Ability to manage health-related needs will improve 02/20/2023 0444 by Ramon Dredge, RN Outcome: Progressing 02/20/2023 0444 by Ramon Dredge, RN Outcome: Progressing   Problem: Clinical Measurements: Goal: Ability to maintain clinical measurements within normal limits will improve 02/20/2023 0444 by Ramon Dredge, RN Outcome: Progressing 02/20/2023 0444 by Ramon Dredge, RN Outcome: Progressing Goal: Will remain free from infection 02/20/2023 0444 by Ramon Dredge, RN Outcome: Progressing 02/20/2023 0444 by Ramon Dredge, RN Outcome: Progressing Goal: Diagnostic test results will improve 02/20/2023 0444 by Ramon Dredge, RN Outcome: Progressing 02/20/2023 0444 by Ramon Dredge, RN Outcome: Progressing Goal: Respiratory complications will improve 02/20/2023 0444 by Ramon Dredge, RN Outcome: Progressing 02/20/2023 0444 by Ramon Dredge, RN Outcome: Progressing Goal: Cardiovascular complication will be avoided 02/20/2023 0444 by Ramon Dredge, RN Outcome: Progressing 02/20/2023 0444 by Ramon Dredge, RN Outcome: Progressing   Problem: Activity: Goal: Risk for activity intolerance will decrease 02/20/2023 0444 by Ramon Dredge, RN Outcome: Progressing 02/20/2023 0444 by Ramon Dredge, RN Outcome: Progressing   Problem: Nutrition: Goal: Adequate nutrition will be maintained 02/20/2023 0444 by Ramon Dredge,  RN Outcome: Progressing 02/20/2023 0444 by Ramon Dredge, RN Outcome: Progressing   Problem: Coping: Goal: Level of anxiety will decrease 02/20/2023 0444 by Ramon Dredge, RN Outcome: Progressing 02/20/2023 0444 by Ramon Dredge, RN Outcome: Progressing   Problem: Elimination: Goal: Will not experience complications related to bowel motility 02/20/2023 0444 by Ramon Dredge, RN Outcome: Progressing 02/20/2023 0444 by Ramon Dredge, RN Outcome: Progressing Goal: Will not experience complications related to urinary retention 02/20/2023 0444 by Ramon Dredge, RN Outcome: Progressing 02/20/2023 0444 by Ramon Dredge, RN Outcome: Progressing   Problem: Pain Management: Goal: General experience of comfort will improve 02/20/2023 0444 by Ramon Dredge, RN Outcome: Progressing 02/20/2023 0444 by Ramon Dredge, RN Outcome: Progressing   Problem: Safety: Goal: Ability to remain free from injury will improve 02/20/2023 0444 by Ramon Dredge, RN Outcome: Progressing 02/20/2023 0444 by Ramon Dredge, RN Outcome: Progressing   Problem: Skin Integrity: Goal: Risk for impaired skin integrity will decrease 02/20/2023 0444 by Ramon Dredge, RN Outcome: Progressing 02/20/2023 0444 by Ramon Dredge, RN Outcome: Progressing

## 2023-02-20 NOTE — TOC Progression Note (Signed)
Transition of Care [facility]) - Progression Note    Patient Details  Name: Adam Howell MRN: [id number] Date of Birth: 11-08-1960  [facility]) CM/SW Contact  Liliana Cline, LCSW Phone Number: 02/20/2023, 8:42 AM  Clinical Narrative:    Berkley Harvey pending in Navi portal.         Expected Discharge Plan and Services                                               Social Determinants of Health (SDOH) Interventions SDOH Screenings   Food Insecurity: No Food Insecurity (02/09/2023)  Housing: Low Risk  (02/09/2023)  Transportation Needs: No Transportation Needs (02/09/2023)  Recent Concern: Transportation Needs - Unmet Transportation Needs (12/29/2022)  Utilities: Not At Risk (02/09/2023)  Alcohol Screen: Low Risk  (11/25/2021)  Depression (PHQ2-9): Low Risk  (12/29/2022)  Financial Resource Strain: Low Risk  (11/25/2021)  Physical Activity: Insufficiently Active (11/25/2021)  Social Connections: Moderately Isolated (12/29/2022)  Stress: No Stress Concern Present (11/25/2021)  Tobacco Use: Low Risk  (02/09/2023)    Readmission Risk Interventions    02/16/2023    6:21 PM  Readmission Risk Prevention Plan  Transportation Screening Complete  Palliative Care Screening Not Applicable  Medication Review (RN Care Manager) Complete

## 2023-02-20 NOTE — Progress Notes (Signed)
Progress Note   Patient: Adam Howell [id number]:[id number] DOB: 06/09/1960 DOA: 02/09/2023     11 DOS: the patient was seen and examined on 02/20/2023   Brief hospital course: Espn Eberlein is 62 y.o. male male w/ PMH ESRD on HD MWF, anemia chronic kidney disease, type 2 diabetes, chronic pain syndrome, chronic HFrEF, a/p R AKA, HLD, HTN, CAD.  Presents to the emergency department from home with C/L left leg pain and swelling x 3 to 4 days, foul odor to wound and leaking fluid for past few days.  Has been seeing wound care for this, recently treated outpatient by his PCP with doxycycline but wound has gotten worse.    Patient is seen by vascular team, advised ABI and he had angiogram with percutaneous transluminal angioplasty of left posterior tibial artery and left peroneal artery on Monday. He is on broad spectrum IV antibiotic therapy. ID called for antibiotic regimen.   Assessment and Plan: Ulcer left lower extremity with infection. Patient does have draining, worsening pain. MRI left lower extremity negative for osteomyelitis. Cultures 12/24/22 - Staph, enterococcus, enterobacter. Continue vancomycin and cefepime as recommended by ID Vascular surgery team performed angiogram with percutaneous transluminal angioplasty of left posterior tibial artery and left peroneal artery on Monday.  PT OT has recommended skilled nursing facility [facility] working on placement   ESRD on hemodialysis His usual schedule is Monday, Wednesday and Friday. Nephrology on board for dialysis needs.  Continue dialysis needs   Hyperkalemia Improved after hemodialysis   Chronic atrial fibrillation: Continue amiodarone, Eliquis therapy.   Chronic diastolic CHF No exacerbation. GDMT limited due to low blood pressures.   Chronic pain syndrome: Continue home dose opiates, Lyrica therapy. Continue IV Dilaudid decreased to 0.5 q4, continue norco as needed.   Type 2 diabetes mellitus: A1c 5.9. sugars  stable. He wishes regular diet, changed to renal.   Chronic constipation: Due to chronic opiate use. Laxative discontinued as patient is having diarrhea   Obesity with BMI 32.5: Diet, exercise and weight reduction advised.     DVT prophylaxis   Code Status: Full Code   Subjective:  Patient  have some diarrhea today Denies abdominal pain fever chills nausea vomiting  Physical Exam: General - Elderly obese African American male, no apparent distress HEENT - PERRLA, EOMI, atraumatic head, non tender sinuses. Lung - Clear, diffuse rales, rhonchi, no wheezes. Heart - S1, S2 heard, no murmurs, rubs, 1+ pedal edema. Abdomen - Soft, non tender obese, bowel sounds good Neuro - sleeping, arousable, non focal exam. Skin - Warm and dry. Right AKA, Left arm fistula, Left leg dressing noted.     Family Communication: Discussed with patient, he understand and agree. All questions answereed.   Disposition: Status is: Inpatient Remains inpatient appropriate because: wound infection, IV antibiotics. Vascular, ID follow up.  Planned Discharge Destination: Home with Home Health and Rehab     Time spent: 35 minutes   Data Reviewed: Vitals:   02/19/23 1945 02/20/23 0337 02/20/23 0729 02/20/23 1457  BP: 121/74 138/79 (!) 149/117 135/86  Pulse: 75 67 76 72  Resp: 16 16 16 18   Temp: 98.6 F (37 C) 98.5 F (36.9 C) 98.5 F (36.9 C) 98.4 F (36.9 C)  TempSrc: Oral Axillary Oral Oral  SpO2: 100% 98% 100% 99%  Weight:      Height:          Latest Ref Rng & Units 02/20/2023    4:57 AM 02/19/2023    4:24 AM 02/18/2023  5:25 AM  BMP  Glucose 70 - 99 mg/dL 95  95  75   BUN 8 - 23 mg/dL 34  48  38   Creatinine 0.61 - 1.24 mg/dL 1.32  44.01  0.27   Sodium 135 - 145 mmol/L 133  132  132   Potassium 3.5 - 5.1 mmol/L 4.0  4.5  4.4   Chloride 98 - 111 mmol/L 95  94  93   CO2 22 - 32 mmol/L 27  26  26    Calcium 8.9 - 10.3 mg/dL 8.4  8.2  8.1        Latest Ref Rng & Units 02/20/2023     4:57 AM 02/19/2023    4:24 AM 02/18/2023    5:25 AM  CBC  WBC 4.0 - 10.5 K/uL 6.7  6.8  6.2   Hemoglobin 13.0 - 17.0 g/dL 25.3  66.4  40.3   Hematocrit 39.0 - 52.0 % 40.2  39.3  39.0   Platelets 150 - 400 K/uL 197  212  207      Author: Loyce Dys, MD 02/20/2023 4:13 PM  For on call review [email or web address].

## 2023-02-21 DIAGNOSIS — L089 Local infection of the skin and subcutaneous tissue, unspecified: Secondary | ICD-10-CM | POA: Diagnosis not present

## 2023-02-21 DIAGNOSIS — T148XXA Other injury of unspecified body region, initial encounter: Secondary | ICD-10-CM | POA: Diagnosis not present

## 2023-02-21 DIAGNOSIS — L03116 Cellulitis of left lower limb: Secondary | ICD-10-CM | POA: Diagnosis not present

## 2023-02-21 LAB — CBC WITH DIFFERENTIAL/PLATELET
Abs Immature Granulocytes: 0.03 10*3/uL (ref 0.00–0.07)
Basophils Absolute: 0.1 10*3/uL (ref 0.0–0.1)
Basophils Relative: 1 %
Eosinophils Absolute: 0.4 10*3/uL (ref 0.0–0.5)
Eosinophils Relative: 6 %
HCT: 37.9 % — ABNORMAL LOW (ref 39.0–52.0)
Hemoglobin: 12.4 g/dL — ABNORMAL LOW (ref 13.0–17.0)
Immature Granulocytes: 1 %
Lymphocytes Relative: 24 %
Lymphs Abs: 1.4 10*3/uL (ref 0.7–4.0)
MCH: 28.1 pg (ref 26.0–34.0)
MCHC: 32.7 g/dL (ref 30.0–36.0)
MCV: 85.7 fL (ref 80.0–100.0)
Monocytes Absolute: 0.6 10*3/uL (ref 0.1–1.0)
Monocytes Relative: 9 %
Neutro Abs: 3.5 10*3/uL (ref 1.7–7.7)
Neutrophils Relative %: 59 %
Platelets: 182 10*3/uL (ref 150–400)
RBC: 4.42 MIL/uL (ref 4.22–5.81)
RDW: 14.8 % (ref 11.5–15.5)
WBC: 6 10*3/uL (ref 4.0–10.5)
nRBC: 0 % (ref 0.0–0.2)

## 2023-02-21 LAB — BASIC METABOLIC PANEL
Anion gap: 13 (ref 5–15)
BUN: 48 mg/dL — ABNORMAL HIGH (ref 8–23)
CO2: 26 mmol/L (ref 22–32)
Calcium: 8.2 mg/dL — ABNORMAL LOW (ref 8.9–10.3)
Chloride: 93 mmol/L — ABNORMAL LOW (ref 98–111)
Creatinine, Ser: 10.83 mg/dL — ABNORMAL HIGH (ref 0.61–1.24)
GFR, Estimated: 5 mL/min — ABNORMAL LOW (ref >60)
Glucose, Bld: 90 mg/dL (ref 70–99)
Potassium: 4.2 mmol/L (ref 3.5–5.1)
Sodium: 132 mmol/L — ABNORMAL LOW (ref 135–145)

## 2023-02-21 MED ORDER — LOPERAMIDE HCL 2 MG PO CAPS
4.0000 mg | ORAL_CAPSULE | Freq: Once | ORAL | Status: AC
  Administered 2023-02-21: 4 mg via ORAL
  Filled 2023-02-21: qty 2

## 2023-02-21 NOTE — TOC Progression Note (Signed)
Transition of Care [facility]) - Progression Note    Patient Details  Name: Adam Howell MRN: [id number] Date of Birth: 1961-03-12  Transition [facility] Care [facility]) CM/SW Contact  Liliana Cline, LCSW Phone Number: 02/21/2023, 8:58 AM  Clinical Narrative:    Berkley Harvey still pending in Navi portal.         Expected Discharge Plan and Services                                               Social Determinants of Health (SDOH) Interventions SDOH Screenings   Food Insecurity: No Food Insecurity (02/09/2023)  Housing: Low Risk  (02/09/2023)  Transportation Needs: No Transportation Needs (02/09/2023)  Recent Concern: Transportation Needs - Unmet Transportation Needs (12/29/2022)  Utilities: Not At Risk (02/09/2023)  Alcohol Screen: Low Risk  (11/25/2021)  Depression (PHQ2-9): Low Risk  (12/29/2022)  Financial Resource Strain: Low Risk  (11/25/2021)  Physical Activity: Insufficiently Active (11/25/2021)  Social Connections: Moderately Isolated (12/29/2022)  Stress: No Stress Concern Present (11/25/2021)  Tobacco Use: Low Risk  (02/09/2023)    Readmission Risk Interventions    02/16/2023    6:21 PM  Readmission Risk Prevention Plan  Transportation Screening Complete  Palliative Care Screening Not Applicable  Medication Review (RN Care Manager) Complete

## 2023-02-21 NOTE — Plan of Care (Signed)
  Problem: Clinical Measurements: Goal: Ability to maintain clinical measurements within normal limits will improve Outcome: Progressing   Problem: Clinical Measurements: Goal: Diagnostic test results will improve Outcome: Progressing   Problem: Elimination: Goal: Will not experience complications related to bowel motility Outcome: Progressing   Problem: Pain Management: Goal: General experience of comfort will improve Outcome: Progressing

## 2023-02-21 NOTE — Plan of Care (Signed)

## 2023-02-21 NOTE — Progress Notes (Addendum)
Progress Note   Patient: Adam Howell ZOX:[id number] DOB: 1960-08-26 DOA: 02/09/2023     12 DOS: the patient was seen and examined on 02/21/2023    Brief hospital course: Amed Pretty is 62 y.o. male male w/ PMH ESRD on HD MWF, anemia chronic kidney disease, type 2 diabetes, chronic pain syndrome, chronic HFrEF, a/p R AKA, HLD, HTN, CAD.  Presents to the emergency department from home with C/L left leg pain and swelling x 3 to 4 days, foul odor to wound and leaking fluid for past few days.  Has been seeing wound care for this, recently treated outpatient by his PCP with doxycycline but wound has gotten worse.    Patient is seen by vascular team, advised ABI and he had angiogram with percutaneous transluminal angioplasty of left posterior tibial artery and left peroneal artery on Monday. He is on broad spectrum IV antibiotic therapy. ID called for antibiotic regimen.   Assessment and Plan: Ulcer left lower extremity with infection. Patient does have draining, worsening pain. MRI left lower extremity negative for osteomyelitis. Cultures 12/24/22 - Staph, enterococcus, enterobacter. Continue vancomycin and cefepime as recommended by ID Vascular surgery team performed angiogram with percutaneous transluminal angioplasty of left posterior tibial artery and left peroneal artery on Monday.  PT OT has recommended skilled nursing facility [facility] working on placement   ESRD on hemodialysis His usual schedule is Monday, Wednesday and Friday. Nephrology on board for dialysis needs.  Continue dialysis needs   Hyperkalemia Improved after hemodialysis   Chronic atrial fibrillation: Continue amiodarone, Eliquis therapy.   Chronic diastolic CHF No exacerbation. GDMT limited due to low blood pressures.   Chronic pain syndrome: Continue home dose opiates, Lyrica therapy. Continue IV Dilaudid decreased to 0.5 q4, continue norco as needed.   Type 2 diabetes mellitus: A1c 5.9.  sugars stable. He wishes regular diet, changed to renal.   Chronic constipation: Due to chronic opiate use. Laxative discontinued as patient is having diarrhea   Obesity with BMI 32.5: Diet, exercise and weight reduction advised.     DVT prophylaxis   Code Status: Full Code   Subjective:  Patient had some complaints of diarrhea today Have been given Imodium Denied abdominal pain fever less likely C. difficile   Physical Exam: General - Elderly obese African American male, no apparent distress HEENT - PERRLA, EOMI, atraumatic head, non tender sinuses. Lung - Clear, diffuse rales, rhonchi, no wheezes. Heart - S1, S2 heard, no murmurs, rubs, 1+ pedal edema. Abdomen - Soft, non tender obese, bowel sounds good Neuro - sleeping, arousable, non focal exam. Skin - Warm and dry. Right AKA, Left arm fistula, Left leg dressing noted.     Family Communication: Discussed with patient, he understand and agree. All questions answereed.   Disposition: Status is: Inpatient Remains inpatient appropriate because: wound infection, IV antibiotics. Vascular, ID follow up.  Planned Discharge Destination: Home with Home Health and Rehab     Time spent: 35 minutes   Data Reviewed:  Vitals:   02/20/23 2055 02/21/23 0442 02/21/23 0804 02/21/23 1619  BP: 127/79 130/65 126/68 133/67  Pulse: 66 64 62 63  Resp:  20 18   Temp:  97.9 F (36.6 C) 98.8 F (37.1 C) 98.7 F (37.1 C)  TempSrc:  Oral Oral Oral  SpO2:  98% 99% 98%  Weight:      Height:          Latest Ref Rng & Units 02/21/2023    4:32 AM 02/20/2023  4:57 AM 02/19/2023    4:24 AM  BMP  Glucose 70 - 99 mg/dL 90  95  95   BUN 8 - 23 mg/dL 48  34  48   Creatinine 0.61 - 1.24 mg/dL 16.10  9.60  45.40   Sodium 135 - 145 mmol/L 132  133  132   Potassium 3.5 - 5.1 mmol/L 4.2  4.0  4.5   Chloride 98 - 111 mmol/L 93  95  94   CO2 22 - 32 mmol/L 26  27  26    Calcium 8.9 - 10.3 mg/dL 8.2  8.4  8.2        Latest Ref Rng & Units  02/21/2023    4:32 AM 02/20/2023    4:57 AM 02/19/2023    4:24 AM  CBC  WBC 4.0 - 10.5 K/uL 6.0  6.7  6.8   Hemoglobin 13.0 - 17.0 g/dL 98.1  19.1  47.8   Hematocrit 39.0 - 52.0 % 37.9  40.2  39.3   Platelets 150 - 400 K/uL 182  197  212      Author: Loyce Dys, MD 02/21/2023 5:31 PM  For on call review [email or web address].

## 2023-02-21 NOTE — Progress Notes (Signed)
[facility]  ROUNDING NOTE   Subjective:   Adam Howell is a 62 y.o male with past medical history of diabetes, hypertension, CAD, Rt AKA, anemia, CHF, and ESRD on dialysis. Patient presents to ED with left leg pain and swelling. He has been admitted for Wound infection [T14.8XXA, L08.9] Left leg cellulitis [L03.116] Infected ulcer of skin (HCC) [L98.499, L08.9]   Patient known to our practice and receives outpatient dialysis treatments at [facility] on a MWF schedule, supervised by [facility] physicians.  Update: Patient sitting up in bed resting comfortably. Patient denies shortness of breath, on room air. Patient endorses mild pain with left leg but is manageable.    Objective:  Vital signs in last 24 hours:  Temp:  [97.9 F (36.6 C)-98.8 F (37.1 C)] 98.8 F (37.1 C) (12/08 0804) Pulse Rate:  [62-72] 62 (12/08 0804) Resp:  [16-20] 18 (12/08 0804) BP: (126-135)/(65-86) 126/68 (12/08 0804) SpO2:  [98 %-100 %] 99 % (12/08 0804)  Weight change:  Filed Weights   02/17/23 1031 02/17/23 1400 02/19/23 0914  Weight: 104.5 kg 103 kg 101.9 kg    Intake/Output: I/O last 3 completed shifts: In: 420 [P.O.:120; IV Piggyback:300] Out: -    Intake/Output this shift:  No intake/output data recorded.  Physical Exam: General: NAD,   Head: Normocephalic, atraumatic. Moist oral mucosal membranes  Eyes: Anicteric, PERRL  Neck: Supple, trachea midline  Lungs:  Clear to auscultation  Heart: Regular rate and rhythm  Abdomen:  Soft, nontender,   Extremities:  No peripheral edema.  Neurologic: Nonfocal, moving all four extremities  Skin: No lesions  Access: Left AVF    Basic Metabolic Panel: Recent Labs  Lab 02/16/23 0921 02/16/23 1554 02/17/23 1041 02/18/23 0525 02/19/23 0424 02/20/23 0457 02/21/23 0432  NA 129*   < > 132* 132* 132* 133* 132*  K 7.0*   < > 4.6 4.4 4.5 4.0 4.2  CL 94*   < > 93* 93* 94* 95* 93*  CO2 21*   < > 25 26 26 27 26   GLUCOSE 101*    < > 101* 75 95 95 90  BUN 82*   < > 54* 38* 48* 34* 48*  CREATININE 13.73*   < > 11.02* 8.67* 10.35* 8.24* 10.83*  CALCIUM 8.3*   < > 8.2* 8.1* 8.2* 8.4* 8.2*  PHOS 5.8*  --  5.4*  --   --   --   --    < > = values in this interval not displayed.    Liver Function Tests: Recent Labs  Lab 02/16/23 0921 02/17/23 1041  ALBUMIN 2.8* 2.6*   No results for input(s): "LIPASE", "AMYLASE" in the last 168 hours. No results for input(s): "AMMONIA" in the last 168 hours.  CBC: Recent Labs  Lab 02/17/23 1041 02/18/23 0525 02/19/23 0424 02/20/23 0457 02/21/23 0432  WBC 6.4 6.2 6.8 6.7 6.0  NEUTROABS  --  4.0 4.5 4.3 3.5  HGB 11.7* 12.8* 12.9* 13.3 12.4*  HCT 34.9* 39.0 39.3 40.2 37.9*  MCV 84.5 86.9 86.2 84.8 85.7  PLT 221 207 212 197 182    Cardiac Enzymes: No results for input(s): "CKTOTAL", "CKMB", "CKMBINDEX", "TROPONINI" in the last 168 hours.  BNP: Invalid input(s): "POCBNP"  CBG: Recent Labs  Lab 02/15/23 0935 02/16/23 0751  GLUCAP 74 71    Microbiology: Results for orders placed or performed during the hospital encounter of 02/09/23  Culture, blood (routine x 2)     Status: None   Collection  Time: 02/09/23 10:30 AM   Specimen: BLOOD  Result Value Ref Range Status   Specimen Description BLOOD RIGHT [facility]  Final   Special Requests BOTTLES DRAWN AEROBIC AND ANAEROBIC  Final   Culture   Final    NO GROWTH 5 DAYS Performed at [facility], [address] [place], [place] [address]    Report Status 02/14/2023 FINAL  Final  Culture, blood (routine x 2)     Status: None   Collection Time: 02/09/23  9:46 PM   Specimen: BLOOD  Result Value Ref Range Status   Specimen Description BLOOD RIGHT HAND  Final   Special Requests   Final    BOTTLES DRAWN AEROBIC AND ANAEROBIC Blood Culture adequate volume   Culture   Final    NO GROWTH 5 DAYS Performed at [facility], [address] [place], [place] [address]    Report Status 02/14/2023 FINAL  Final   Aerobic Culture w Gram Stain (superficial specimen)     Status: None   Collection Time: 02/16/23  4:34 PM   Specimen: Leg; Wound  Result Value Ref Range Status   Specimen Description LEG  Final   Special Requests LEFT LEG  Final   Gram Stain NO WBC SEEN RARE GRAM POSITIVE COCCI IN PAIRS   Final   Culture   Final    RARE ENTEROCOCCUS FAECALIS RARE CORYNEBACTERIUM STRIATUM Standardized susceptibility testing for this organism is not available.    Report Status 02/20/2023 FINAL  Final   Organism ID, Bacteria ENTEROCOCCUS FAECALIS  Final      Susceptibility   Enterococcus faecalis - MIC*    AMPICILLIN <=2 SENSITIVE Sensitive     VANCOMYCIN 1 SENSITIVE Sensitive     GENTAMICIN SYNERGY RESISTANT Resistant     * RARE ENTEROCOCCUS FAECALIS    Coagulation Studies: No results for input(s): "LABPROT", "INR" in the last 72 hours.  Urinalysis: No results for input(s): "COLORURINE", "LABSPEC", "PHURINE", "GLUCOSEU", "HGBUR", "BILIRUBINUR", "KETONESUR", "PROTEINUR", "UROBILINOGEN", "NITRITE", "LEUKOCYTESUR" in the last 72 hours.  Invalid input(s): "APPERANCEUR"    Imaging: No results found.   Medications:    ceFEPime (MAXIPIME) IV Stopped (02/19/23 2024)   vancomycin Stopped (02/19/23 1400)    amiodarone  200 mg Oral BID   apixaban  2.5 mg Oral BID   aspirin EC  81 mg Oral Daily   calcitRIOL  1.5 mcg Oral Q M,W,F-1800   Chlorhexidine Gluconate Cloth  6 each Topical Q0600   cinacalcet  30 mg Oral Q breakfast   leptospermum manuka honey  1 Application Topical Daily   melatonin  2.5 mg Oral QHS   multivitamin  1 tablet Oral QHS   pregabalin  25 mg Oral Daily   rosuvastatin  40 mg Oral Daily   sevelamer carbonate  2,400 mg Oral TID WC   acetaminophen **OR** acetaminophen, hydrALAZINE, HYDROcodone-acetaminophen, HYDROmorphone (DILAUDID) injection, LORazepam, ondansetron **OR** ondansetron (ZOFRAN) IV  Assessment/ Plan:  [name] is a 62 y.o.  male  with past  medical history of diabetes, hypertension, CAD, Rt AKA, anemia, CHF, and ESRD on dialysis. Patient presents to ED with left leg pain and swelling. He has been admitted for Wound infection [T14.8XXA, L08.9] Left leg cellulitis [L03.116] Infected ulcer of skin (HCC) [L98.499, L08.9]   [facility] [place]/MWF/Lt AVF   End stage renal disease on hemodialysis. Last treatment Friday, 3 L removed.  Next treatment scheduled for Monday 12/8.   2. Anemia of chronic kidney disease  12/8 HgB 12.4  Will continue to monitor hemoglobin and assess need for ESA's.   3. Wound infection, Left leg. Imaging negative for osteomyelitis. We feel wound is more likely arterial insuffiencey and not due to calciphylaxis.  Receiving cefepime and vancomycin.  Vascular ABI study normal. Vascular surgery performed angioplasty with intervention on 02/15/2023.  ID recommends Cefepime 2g and Vancomycin 1g with each dialysis until 02/24/23. Outpatient clinic notified.   4. Secondary Hyperparathyroidism: with outpatient labs: PTH 558, phosphorus 7.0, calcium 8.2 on 01/20/23.    Recent Labs           Lab Results  Component Value Date    CALCIUM 8.2 (L) 02/21/2023    PHOS 5.4 (H) 02/17/2023      Prescribed calcitriol, cinacalcet and sevelamer outpatient.  Will continue to monitor bone minerals.     LOS: 12 Adam Howell P Windell Moulding 12/8/202411:00 AM

## 2023-02-22 DIAGNOSIS — L03116 Cellulitis of left lower limb: Secondary | ICD-10-CM | POA: Diagnosis not present

## 2023-02-22 DIAGNOSIS — L089 Local infection of the skin and subcutaneous tissue, unspecified: Secondary | ICD-10-CM | POA: Diagnosis not present

## 2023-02-22 DIAGNOSIS — T148XXA Other injury of unspecified body region, initial encounter: Secondary | ICD-10-CM | POA: Diagnosis not present

## 2023-02-22 LAB — CBC WITH DIFFERENTIAL/PLATELET
Abs Immature Granulocytes: 0.04 10*3/uL (ref 0.00–0.07)
Basophils Absolute: 0.1 10*3/uL (ref 0.0–0.1)
Basophils Relative: 1 %
Eosinophils Absolute: 0.4 10*3/uL (ref 0.0–0.5)
Eosinophils Relative: 6 %
HCT: 36.4 % — ABNORMAL LOW (ref 39.0–52.0)
Hemoglobin: 12.1 g/dL — ABNORMAL LOW (ref 13.0–17.0)
Immature Granulocytes: 1 %
Lymphocytes Relative: 21 %
Lymphs Abs: 1.5 10*3/uL (ref 0.7–4.0)
MCH: 28.5 pg (ref 26.0–34.0)
MCHC: 33.2 g/dL (ref 30.0–36.0)
MCV: 85.8 fL (ref 80.0–100.0)
Monocytes Absolute: 0.7 10*3/uL (ref 0.1–1.0)
Monocytes Relative: 10 %
Neutro Abs: 4.6 10*3/uL (ref 1.7–7.7)
Neutrophils Relative %: 61 %
Platelets: 163 10*3/uL (ref 150–400)
RBC: 4.24 MIL/uL (ref 4.22–5.81)
RDW: 14.8 % (ref 11.5–15.5)
WBC: 7.4 10*3/uL (ref 4.0–10.5)
nRBC: 0 % (ref 0.0–0.2)

## 2023-02-22 LAB — BASIC METABOLIC PANEL
Anion gap: 12 (ref 5–15)
BUN: 58 mg/dL — ABNORMAL HIGH (ref 8–23)
CO2: 27 mmol/L (ref 22–32)
Calcium: 7.9 mg/dL — ABNORMAL LOW (ref 8.9–10.3)
Chloride: 94 mmol/L — ABNORMAL LOW (ref 98–111)
Creatinine, Ser: 12.03 mg/dL — ABNORMAL HIGH (ref 0.61–1.24)
GFR, Estimated: 4 mL/min — ABNORMAL LOW (ref >60)
Glucose, Bld: 97 mg/dL (ref 70–99)
Potassium: 4.5 mmol/L (ref 3.5–5.1)
Sodium: 133 mmol/L — ABNORMAL LOW (ref 135–145)

## 2023-02-22 LAB — CBC
HCT: 33.5 % — ABNORMAL LOW (ref 39.0–52.0)
Hemoglobin: 11.1 g/dL — ABNORMAL LOW (ref 13.0–17.0)
MCH: 28.2 pg (ref 26.0–34.0)
MCHC: 33.1 g/dL (ref 30.0–36.0)
MCV: 85 fL (ref 80.0–100.0)
Platelets: 143 10*3/uL — ABNORMAL LOW (ref 150–400)
RBC: 3.94 MIL/uL — ABNORMAL LOW (ref 4.22–5.81)
RDW: 14.8 % (ref 11.5–15.5)
WBC: 6 10*3/uL (ref 4.0–10.5)
nRBC: 0 % (ref 0.0–0.2)

## 2023-02-22 MED ORDER — ANTICOAGULANT SODIUM CITRATE 4% (200MG/5ML) IV SOLN
5.0000 mL | Status: DC | PRN
  Filled 2023-02-22: qty 5

## 2023-02-22 MED ORDER — LOPERAMIDE HCL 2 MG PO CAPS
4.0000 mg | ORAL_CAPSULE | Freq: Once | ORAL | Status: AC
  Administered 2023-02-22: 4 mg via ORAL
  Filled 2023-02-22: qty 2

## 2023-02-22 MED ORDER — PENTAFLUOROPROP-TETRAFLUOROETH EX AERO: 1.0000 | INHALATION_SPRAY | CUTANEOUS | Status: DC | PRN

## 2023-02-22 MED ORDER — LIDOCAINE-PRILOCAINE 2.5-2.5 % EX CREA: 1.0000 | TOPICAL_CREAM | CUTANEOUS | Status: DC | PRN

## 2023-02-22 MED ORDER — LIDOCAINE HCL (PF) 1 % IJ SOLN
5.0000 mL | INTRAMUSCULAR | Status: DC | PRN
  Filled 2023-02-22: qty 5

## 2023-02-22 MED ORDER — HEPARIN SODIUM (PORCINE) 1000 UNIT/ML DIALYSIS: 1000.0000 [IU] | INTRAMUSCULAR | Status: DC | PRN

## 2023-02-22 MED ORDER — HYDROMORPHONE HCL 1 MG/ML IJ SOLN
INTRAMUSCULAR | Status: AC
  Filled 2023-02-22: qty 1

## 2023-02-22 MED ORDER — ALTEPLASE 2 MG IJ SOLR: 2.0000 mg | Freq: Once | INTRAMUSCULAR | Status: DC | PRN

## 2023-02-22 NOTE — Progress Notes (Signed)
Progress Note   Patient: Adam Howell:[id number] DOB: Sep 02, 1960 DOA: 02/09/2023     13 DOS: the patient was seen and examined on 02/22/2023     Brief hospital course: Adam Howell is 62 y.o. male male w/ PMH ESRD on HD MWF, anemia chronic kidney disease, type 2 diabetes, chronic pain syndrome, chronic HFrEF, a/p R AKA, HLD, HTN, CAD.  Presents to the emergency department from home with C/L left leg pain and swelling x 3 to 4 days, foul odor to wound and leaking fluid for past few days.  Has been seeing wound care for this, recently treated outpatient by his PCP with doxycycline but wound has gotten worse.    Patient is seen by vascular team, advised ABI and he had angiogram with percutaneous transluminal angioplasty of left posterior tibial artery and left peroneal artery on Monday. He is on broad spectrum IV antibiotic therapy. ID called for antibiotic regimen.   Assessment and Plan: Ulcer left lower extremity with infection. Patient does have draining, worsening pain. MRI left lower extremity negative for osteomyelitis. Cultures 12/24/22 - Staph, enterococcus, enterobacter. Continue vancomycin and cefepime as recommended by ID Vascular surgery team performed angiogram with percutaneous transluminal angioplasty of left posterior tibial artery and left peroneal artery  PT OT has recommended skilled nursing facility Social services working on placement   ESRD on hemodialysis His usual schedule is Monday, Wednesday and Friday. Nephrology on board for dialysis needs.  Continue dialysis needs   Hyperkalemia Improved after hemodialysis   Chronic atrial fibrillation: Continue amiodarone, Eliquis therapy.   Chronic diastolic CHF No exacerbation. GDMT limited due to low blood pressures.   Chronic pain syndrome: Continue home dose opiates, Lyrica therapy. Continue IV Dilaudid decreased to 0.5 q4, continue norco as needed.   Type 2 diabetes mellitus: A1c 5.9. sugars  stable. He wishes regular diet, changed to renal.   Chronic constipation: Due to chronic opiate use. Laxative discontinued as patient is having diarrhea   Obesity with BMI 32.5: Diet, exercise and weight reduction advised.     DVT prophylaxis   Code Status: Full Code   Subjective:  Patient seen and examined at bedside this morning Denies nausea vomiting abdominal pain chest pain cough   Physical Exam: General - Elderly obese African American male, no apparent distress HEENT - PERRLA, EOMI, atraumatic head, non tender sinuses. Lung - Clear, diffuse rales, rhonchi, no wheezes. Heart - S1, S2 heard, no murmurs, rubs, 1+ pedal edema. Abdomen - Soft, non tender obese, bowel sounds good Neuro - sleeping, arousable, non focal exam. Skin - Warm and dry. Right AKA, Left arm fistula, Left leg dressing noted.     Family Communication: Discussed with patient, he understand and agree. All questions answereed.   Disposition: Status is: Inpatient Remains inpatient appropriate because: wound infection, IV antibiotics. Vascular, ID follow up.  Planned Discharge Destination: Home with Home Health and Rehab     Time spent: 36 minutes   Data Reviewed:      Latest Ref Rng & Units 02/22/2023    4:24 AM 02/21/2023    4:32 AM 02/20/2023    4:57 AM  BMP  Glucose 70 - 99 mg/dL 97  90  95   BUN 8 - 23 mg/dL 58  48  34   Creatinine 0.61 - 1.24 mg/dL 81.19  14.78  2.95   Sodium 135 - 145 mmol/L 133  132  133   Potassium 3.5 - 5.1 mmol/L 4.5  4.2  4.0  Chloride 98 - 111 mmol/L 94  93  95   CO2 22 - 32 mmol/L 27  26  27    Calcium 8.9 - 10.3 mg/dL 7.9  8.2  8.4        Latest Ref Rng & Units 02/22/2023    1:57 PM 02/22/2023    4:24 AM 02/21/2023    4:32 AM  CBC  WBC 4.0 - 10.5 K/uL 6.0  7.4  6.0   Hemoglobin 13.0 - 17.0 g/dL 16.1  09.6  04.5   Hematocrit 39.0 - 52.0 % 33.5  36.4  37.9   Platelets 150 - 400 K/uL 143  163  182     Vitals:   02/22/23 1600 02/22/23 1630 02/22/23 1700  02/22/23 1709  BP: 138/83 121/70 116/68 125/79  Pulse: (!) 54 62 65 64  Resp: 14 15 15 14   Temp:    98.2 F (36.8 C)  TempSrc:    Oral  SpO2: 90% 95% 97% 97%  Weight:      Height:        Author: Loyce Dys, MD 02/22/2023 5:20 PM  For on call review [email or web address].

## 2023-02-22 NOTE — TOC Progression Note (Signed)
Transition of Care [facility]) - Progression Note    Patient Details  Name: Adam Howell MRN: [id number] Date of Birth: 02/22/1961  [facility] CM/SW Contact  Chapman Fitch, RN Phone Number: 02/22/2023, 9:18 AM  Clinical Narrative:        Insurance auth still pending for [facility] with navi rep.  They confirm they have the auth and that it is pending review     Expected Discharge Plan and Services                                               Social Determinants of Health (SDOH) Interventions SDOH Screenings   Food Insecurity: No Food Insecurity (02/09/2023)  Housing: Low Risk  (02/09/2023)  Transportation Needs: No Transportation Needs (02/09/2023)  Recent Concern: Transportation Needs - Unmet Transportation Needs (12/29/2022)  Utilities: Not At Risk (02/09/2023)  Alcohol Screen: Low Risk  (11/25/2021)  Depression (PHQ2-9): Low Risk  (12/29/2022)  Financial Resource Strain: Low Risk  (11/25/2021)  Physical Activity: Insufficiently Active (11/25/2021)  Social Connections: Moderately Isolated (12/29/2022)  Stress: No Stress Concern Present (11/25/2021)  Tobacco Use: Low Risk  (02/09/2023)    Readmission Risk Interventions    02/16/2023    6:21 PM  Readmission Risk Prevention Plan  Transportation Screening Complete  Palliative Care Screening Not Applicable  Medication Review (RN Care Manager) Complete

## 2023-02-22 NOTE — Progress Notes (Signed)
Received patient in bed to unit.    Informed consent signed and in chart.    TX duration: 3 hrs     Transported back to floor  Hand-off given to patient's nurse.   Access used:  lt arm avf Access issues: n/a  Total UF removed: 0 mls Medication(s) given: Vancomycin 1000 mg iv, Dilaudid 0.5mg  iv       Maple Hudson, RN Dialysis Unit

## 2023-02-22 NOTE — TOC Progression Note (Signed)
Transition of Care [facility]) - Progression Note    Patient Details  Name: Adam Howell MRN: [id number] Date of Birth: [birth year]  Transition of Care [facility]) CM/SW Contact  Chapman Fitch, RN Phone Number: 02/22/2023, 2:18 PM  Clinical Narrative:      Insurance auth still pending      Expected Discharge Plan and Services                                               Social Determinants of Health (SDOH) Interventions SDOH Screenings   Food Insecurity: No Food Insecurity (02/09/2023)  Housing: Low Risk  (02/09/2023)  Transportation Needs: No Transportation Needs (02/09/2023)  Recent Concern: Transportation Needs - Unmet Transportation Needs (12/29/2022)  Utilities: Not At Risk (02/09/2023)  Alcohol Screen: Low Risk  (11/25/2021)  Depression (PHQ2-9): Low Risk  (12/29/2022)  Financial Resource Strain: Low Risk  (11/25/2021)  Physical Activity: Insufficiently Active (11/25/2021)  Social Connections: Moderately Isolated (12/29/2022)  Stress: No Stress Concern Present (11/25/2021)  Tobacco Use: Low Risk  (02/09/2023)    Readmission Risk Interventions    02/16/2023    6:21 PM  Readmission Risk Prevention Plan  Transportation Screening Complete  Palliative Care Screening Not Applicable  Medication Review (RN Care Manager) Complete

## 2023-02-22 NOTE — Plan of Care (Signed)

## 2023-02-22 NOTE — Progress Notes (Signed)
[facility]  ROUNDING NOTE   Subjective:   Adam Howell is a 62 y.o male with past medical history of diabetes, hypertension, CAD, Rt AKA, anemia, CHF, and ESRD on dialysis. Patient presents to ED with left leg pain and swelling. He has been admitted for Wound infection [T14.8XXA, L08.9] Left leg cellulitis [L03.116] Infected ulcer of skin (HCC) [L98.499, L08.9]  Patient known to our practice and receives outpatient dialysis treatments at [facility] on a MWF schedule, supervised by [facility] physicians.   Update: Patient complaining of diarrhea this morning Alert States he's had loose stools during the night   Objective:  Vital signs in last 24 hours:  Temp:  [98 F (36.7 C)-98.7 F (37.1 C)] 98.3 F (36.8 C) (12/09 0830) Pulse Rate:  [56-63] 56 (12/09 0830) Resp:  [18] 18 (12/09 0830) BP: (132-141)/(67-97) 139/97 (12/09 0830) SpO2:  [97 %-100 %] 99 % (12/09 0830)  Weight change:  Filed Weights   02/17/23 1031 02/17/23 1400 02/19/23 0914  Weight: 104.5 kg 103 kg 101.9 kg    Intake/Output: I/O last 3 completed shifts: In: 120 [P.O.:120] Out: -    Intake/Output this shift:  Total I/O In: 120 [P.O.:120] Out: -   Physical Exam: General: NAD  Head: Normocephalic, atraumatic. Moist oral mucosal membranes  Eyes: Anicteric  Lungs:  Clear to auscultation, normal effort  Heart: Regular rate and rhythm  Abdomen:  Soft, nontender  Extremities: No peripheral edema.  Neurologic: Alert and oriented, moving all four extremities  Skin: LLE wound, ace bandage  Access: Lt AVF    Basic Metabolic Panel: Recent Labs  Lab 02/16/23 0921 02/16/23 1554 02/17/23 1041 02/18/23 0525 02/19/23 0424 02/20/23 0457 02/21/23 0432 02/22/23 0424  NA 129*   < > 132* 132* 132* 133* 132* 133*  K 7.0*   < > 4.6 4.4 4.5 4.0 4.2 4.5  CL 94*   < > 93* 93* 94* 95* 93* 94*  CO2 21*   < > 25 26 26 27 26 27   GLUCOSE 101*   < > 101* 75 95 95 90 97  BUN 82*   < > 54* 38* 48*  34* 48* 58*  CREATININE 13.73*   < > 11.02* 8.67* 10.35* 8.24* 10.83* 12.03*  CALCIUM 8.3*   < > 8.2* 8.1* 8.2* 8.4* 8.2* 7.9*  PHOS 5.8*  --  5.4*  --   --   --   --   --    < > = values in this interval not displayed.    Liver Function Tests: Recent Labs  Lab 02/16/23 0921 02/17/23 1041  ALBUMIN 2.8* 2.6*   No results for input(s): "LIPASE", "AMYLASE" in the last 168 hours. No results for input(s): "AMMONIA" in the last 168 hours.  CBC: Recent Labs  Lab 02/18/23 0525 02/19/23 0424 02/20/23 0457 02/21/23 0432 02/22/23 0424  WBC 6.2 6.8 6.7 6.0 7.4  NEUTROABS 4.0 4.5 4.3 3.5 4.6  HGB 12.8* 12.9* 13.3 12.4* 12.1*  HCT 39.0 39.3 40.2 37.9* 36.4*  MCV 86.9 86.2 84.8 85.7 85.8  PLT 207 212 197 182 163    Cardiac Enzymes: No results for input(s): "CKTOTAL", "CKMB", "CKMBINDEX", "TROPONINI" in the last 168 hours.  BNP: Invalid input(s): "POCBNP"  CBG: Recent Labs  Lab 02/16/23 0751  GLUCAP 71    Microbiology: Results for orders placed or performed during the hospital encounter of 02/09/23  Culture, blood (routine x 2)     Status: None   Collection Time: 02/09/23 10:30 AM  Specimen: BLOOD  Result Value Ref Range Status   Specimen Description BLOOD RIGHT [facility]  Final   Special Requests BOTTLES DRAWN AEROBIC AND ANAEROBIC  Final   Culture   Final    NO GROWTH 5 DAYS Performed at [facility], [address] [place], [place] [address]    Report Status 02/14/2023 FINAL  Final  Culture, blood (routine x 2)     Status: None   Collection Time: 02/09/23  9:46 PM   Specimen: BLOOD  Result Value Ref Range Status   Specimen Description BLOOD RIGHT HAND  Final   Special Requests   Final    BOTTLES DRAWN AEROBIC AND ANAEROBIC Blood Culture adequate volume   Culture   Final    NO GROWTH 5 DAYS Performed at [facility], [address], [place], [place] [address]    Report Status 02/14/2023 FINAL  Final  Aerobic Culture w Gram Stain (superficial  specimen)     Status: None   Collection Time: 02/16/23  4:34 PM   Specimen: Leg; Wound  Result Value Ref Range Status   Specimen Description LEG  Final   Special Requests LEFT LEG  Final   Gram Stain NO WBC SEEN RARE GRAM POSITIVE COCCI IN PAIRS   Final   Culture   Final    RARE ENTEROCOCCUS FAECALIS RARE CORYNEBACTERIUM STRIATUM Standardized susceptibility testing for this organism is not available.    Report Status 02/20/2023 FINAL  Final   Organism ID, Bacteria ENTEROCOCCUS FAECALIS  Final      Susceptibility   Enterococcus faecalis - MIC*    AMPICILLIN <=2 SENSITIVE Sensitive     VANCOMYCIN 1 SENSITIVE Sensitive     GENTAMICIN SYNERGY RESISTANT Resistant     * RARE ENTEROCOCCUS FAECALIS    Coagulation Studies: No results for input(s): "LABPROT", "INR" in the last 72 hours.  Urinalysis: No results for input(s): "COLORURINE", "LABSPEC", "PHURINE", "GLUCOSEU", "HGBUR", "BILIRUBINUR", "KETONESUR", "PROTEINUR", "UROBILINOGEN", "NITRITE", "LEUKOCYTESUR" in the last 72 hours.  Invalid input(s): "APPERANCEUR"    Imaging: No results found.   Medications:    anticoagulant sodium citrate     ceFEPime (MAXIPIME) IV Stopped (02/19/23 2024)   vancomycin Stopped (02/19/23 1400)    amiodarone  200 mg Oral BID   apixaban  2.5 mg Oral BID   aspirin EC  81 mg Oral Daily   calcitRIOL  1.5 mcg Oral Q M,W,F-1800   Chlorhexidine Gluconate Cloth  6 each Topical Q0600   cinacalcet  30 mg Oral Q breakfast   leptospermum manuka honey  1 Application Topical Daily   melatonin  2.5 mg Oral QHS   multivitamin  1 tablet Oral QHS   pregabalin  25 mg Oral Daily   rosuvastatin  40 mg Oral Daily   sevelamer carbonate  2,400 mg Oral TID WC   acetaminophen **OR** acetaminophen, alteplase, anticoagulant sodium citrate, heparin, hydrALAZINE, HYDROcodone-acetaminophen, HYDROmorphone (DILAUDID) injection, lidocaine (PF), lidocaine-prilocaine, LORazepam, ondansetron **OR** ondansetron (ZOFRAN) IV,  pentafluoroprop-tetrafluoroeth  Assessment/ Plan:  Adam Howell is a 62 y.o.  male with past medical history of diabetes, hypertension, CAD, Rt AKA, anemia, CHF, and ESRD on dialysis. Patient presents to ED with left leg pain and swelling. He has been admitted for Wound infection [T14.8XXA, L08.9] Left leg cellulitis [L03.116] Infected ulcer of skin (HCC) [L98.499, L08.9]  [facility]/MWF/Lt AVF  End stage renal disease on hemodialysis.  Scheduled to receive dialysis later today. Due to diarrhea, will decrease UF. Next treatment scheduled for Wednesday  2. Anemia of chronic kidney disease Lab Results  Component Value Date   HGB 12.1 (L) 02/22/2023    Hemoglobin within desired range. Will continue to monitor for need of ESA.    3. Wound infection, Left leg. Imaging negative for osteomyelitis. We feel wound is more likely arterial insuffiencey and not due to calciphylaxis.  Receiving cefepime and vancomycin.  Vascular ABI study normal. Vascular surgery performed angioplasty with intervention on 02/15/2023.  ID recommends Cefepime 2g and Vancomycin 1g with each dialysis until 02/24/23.   4. Secondary Hyperparathyroidism: with outpatient labs: PTH 558, phosphorus 7.0, calcium 8.2 on 01/20/23.   Lab Results  Component Value Date   CALCIUM 7.9 (L) 02/22/2023   PHOS 5.4 (H) 02/17/2023    Prescribed calcitriol, cinacalcet and sevelamer outpatient. Will continue to monitor bone minerals   LOS: 13 Markie Frith 12/9/202412:16 PM

## 2023-02-22 NOTE — Consult Note (Signed)
Pharmacy Antibiotic Note  Adam Howell is a 62 y.o. male admitted on 02/09/2023 with left leg cellulitis. PMH is significant for ESRD on HD (MWF), HTN, HLD, and T2DM. Per patient, he has been dealing with a chronic wound on his left shin for a while, but the wound has recently gotten larger with significant drainage. He notes that it has become tender to touch over the last few days and started leaking pus. Pharmacy has been consulted for vancomycin dosing.  Today, 02/22/2023 Day 14 antibiotics - vancomycin and cefepime from linezolid and cefepime (+ metronidazole) WBC WNL Afebrile  Renal: HD MWF Blood cultures NG-final 12/3 wound culture - GPC pairs on stain Received vancomycin loading dose 12/4  Plan: Continue vancomycin 1gm qHD MWF to 12/11 Do not anticipate need to check levels as last dose is 12/11 Cefepime 2gm IV qHD MWF to 12/11 OPAT placed to receive above antibiotics at HD with last dose 12/11.  Nephrology aware of this plan.   Height: 6\' 3"  (190.5 cm) Weight: 101.9 kg (224 lb 10.4 oz) IBW/kg (Calculated) : 84.5  Temp (24hrs), Avg:98.4 F (36.9 C), Min:98 F (36.7 C), Max:98.7 F (37.1 C)  Recent Labs  Lab 02/18/23 0525 02/19/23 0424 02/20/23 0457 02/21/23 0432 02/22/23 0424  WBC 6.2 6.8 6.7 6.0 7.4  CREATININE 8.67* 10.35* 8.24* 10.83* 12.03*    Estimated Creatinine Clearance: 8.3 mL/min (A) (by C-G formula based on SCr of 12.03 mg/dL (H)).    Allergies  Allergen Reactions   Tramadol Rash   Antimicrobials this admission: Zosyn 11/26 >>  11/27 Vancomycin 11/26 >> 11/27, 12/4 >> Linezolid 11/28 >> 12/4 Cefepime 11/28 >> Metronidazole 11/28 >>12/4  Dose adjustments this admission:  Microbiology results: See above  Thank you for allowing pharmacy to be a part of this patient's care.  Juliette Alcide, PharmD, BCPS, BCIDP Work Cell: [phone] 02/22/2023 10:18 AM

## 2023-02-23 ENCOUNTER — Encounter: Payer: Self-pay | Admitting: *Deleted

## 2023-02-23 DIAGNOSIS — L089 Local infection of the skin and subcutaneous tissue, unspecified: Secondary | ICD-10-CM | POA: Diagnosis not present

## 2023-02-23 DIAGNOSIS — L03116 Cellulitis of left lower limb: Secondary | ICD-10-CM | POA: Diagnosis not present

## 2023-02-23 DIAGNOSIS — T148XXA Other injury of unspecified body region, initial encounter: Secondary | ICD-10-CM | POA: Diagnosis not present

## 2023-02-23 LAB — BASIC METABOLIC PANEL
Anion gap: 10 (ref 5–15)
BUN: 36 mg/dL — ABNORMAL HIGH (ref 8–23)
CO2: 27 mmol/L (ref 22–32)
Calcium: 7.8 mg/dL — ABNORMAL LOW (ref 8.9–10.3)
Chloride: 94 mmol/L — ABNORMAL LOW (ref 98–111)
Creatinine, Ser: 9.01 mg/dL — ABNORMAL HIGH (ref 0.61–1.24)
GFR, Estimated: 6 mL/min — ABNORMAL LOW (ref >60)
Glucose, Bld: 111 mg/dL — ABNORMAL HIGH (ref 70–99)
Potassium: 4.4 mmol/L (ref 3.5–5.1)
Sodium: 131 mmol/L — ABNORMAL LOW (ref 135–145)

## 2023-02-23 LAB — CBC WITH DIFFERENTIAL/PLATELET
Abs Immature Granulocytes: 0.03 10*3/uL (ref 0.00–0.07)
Basophils Absolute: 0.1 10*3/uL (ref 0.0–0.1)
Basophils Relative: 1 %
Eosinophils Absolute: 0.4 10*3/uL (ref 0.0–0.5)
Eosinophils Relative: 7 %
HCT: 33.5 % — ABNORMAL LOW (ref 39.0–52.0)
Hemoglobin: 11.1 g/dL — ABNORMAL LOW (ref 13.0–17.0)
Immature Granulocytes: 1 %
Lymphocytes Relative: 21 %
Lymphs Abs: 1.3 10*3/uL (ref 0.7–4.0)
MCH: 28.2 pg (ref 26.0–34.0)
MCHC: 33.1 g/dL (ref 30.0–36.0)
MCV: 85.2 fL (ref 80.0–100.0)
Monocytes Absolute: 0.5 10*3/uL (ref 0.1–1.0)
Monocytes Relative: 9 %
Neutro Abs: 3.9 10*3/uL (ref 1.7–7.7)
Neutrophils Relative %: 61 %
Platelets: 144 10*3/uL — ABNORMAL LOW (ref 150–400)
RBC: 3.93 MIL/uL — ABNORMAL LOW (ref 4.22–5.81)
RDW: 14.6 % (ref 11.5–15.5)
WBC: 6.3 10*3/uL (ref 4.0–10.5)
nRBC: 0 % (ref 0.0–0.2)

## 2023-02-23 MED ORDER — LOPERAMIDE HCL 2 MG PO CAPS
4.0000 mg | ORAL_CAPSULE | ORAL | Status: DC | PRN
  Administered 2023-02-23 – 2023-02-24 (×2): 4 mg via ORAL
  Filled 2023-02-23 (×2): qty 2

## 2023-02-23 NOTE — Progress Notes (Signed)
Occupational Therapy Treatment Patient Details Name: Adam Howell MRN: [id number] DOB: Apr 24, 1960 Today's Date: 02/23/2023   History of present illness 62 y.o. male presented to the ED from home with left leg pain and swelling and a wound with foul discharge. He has had a wound for many months now. S/p angiogram with percutaneous transluminal angioplasty of L posterior tibial artery and left peroneal artery on Monday.PMHx: ESRD on dialysis, Anemia, a-fib, CHF, chronic pain, DM, Chronic pain syndrome, Rt AKA, HTN CAD.   OT comments  Pt seen for OT/PT co-treatment to maximize safety and progression of functional mobility this date. Upon arrival to room pt supine in bed, agreeable to tx session. OT facilitated ADL management with education and assist as described below. See ADL section for additional details regarding occupational performance. Pt continues to be functionally limited by decreased balance, pain with mobility, and decreased activity tolerance. Pt return verbalizes understanding of education provided t/o session. Pt is progressing toward OT goals and continues to benefit from skilled OT services to maximize return to PLOF and minimize risk of future falls, injury, caregiver burden, and readmission. He is not at his functional baseline to perform ADL/IADL management. Will continue to follow POC as written. Discharge recommendation for STR remains appropriate.        If plan is discharge home, recommend the following:  A little help with walking and/or transfers;A little help with bathing/dressing/bathroom;Assistance with cooking/housework;Assist for transportation;Help with stairs or ramp for entrance   Equipment Recommendations  None recommended by OT    Recommendations for Other Services      Precautions / Restrictions Precautions Precautions: Fall Restrictions Weight Bearing Restrictions: No       Mobility Bed Mobility Overal bed mobility: Modified Independent                   Transfers Overall transfer level: Needs assistance Equipment used: Rolling walker (2 wheels) Transfers: Sit to/from Stand, Bed to chair/wheelchair/BSC Sit to Stand: Contact guard assist Stand pivot transfers: Contact guard assist, +2 safety/equipment        Lateral/Scoot Transfers: Contact guard assist General transfer comment: pt did need one reminder to lock his WC     Balance Overall balance assessment: Needs assistance Sitting-balance support: Single extremity supported, Feet supported Sitting balance-Leahy Scale: Good     Standing balance support: Bilateral upper extremity supported, Reliant on assistive device for balance, During functional activity Standing balance-Leahy Scale: Poor Standing balance comment: CGA for STS balance at RW, limited tolerance for standing activity.                           ADL either performed or assessed with clinical judgement   ADL                           Toilet Transfer: Requires drop arm;Requires wide/bariatric;Comfort height toilet;Contact guard assist Toilet Transfer Details (indicate cue type and reason): Pt able to perform 180* pivot from [facility] to room commode with increased time/effort to perform and use of lateral grab bar for support. Pt reports he is generally independent with toilet transfers from his WC Toileting- Clothing Manipulation and Hygiene: Set up;Supervision/safety;Sitting/lateral lean Toileting - Clothing Manipulation Details (indicate cue type and reason): Set up assist for peri hygine with seated lean on room commode.     Functional mobility during ADLs: Contact guard assist;Rolling walker (2 wheels);Cueing for safety;Wheelchair General ADL Comments: Pt  able to squat pivot transfer to/from [facility] for toilet transfer and bed mobility with close CGA for safety. Min cueing required x1 instance noted of failure to lock WC during a STS t/f. Attempted sts and pt is able to take a few small  steps witih RW with +2 CGA for safety.    Extremity/Trunk Assessment              Vision Patient Visual Report: No change from baseline     Perception     Praxis      Cognition Arousal: Alert Behavior During Therapy: WFL for tasks assessed/performed Overall Cognitive Status: Within Functional Limits for tasks assessed                                          Exercises Other Exercises Other Exercises: OT facilitated ADL management as described above with education on safety, falls prevention, and safe use of AE/DME for ADL management provided t/o session.    Shoulder Instructions       General Comments      Pertinent Vitals/ Pain       Pain Assessment Pain Assessment: 0-10 Pain Score: 8  Pain Location: LLE-foot Pain Descriptors / Indicators: Sore, Aching Pain Intervention(s): Limited activity within patient's tolerance, Monitored during session, Repositioned, Patient requesting pain meds-RN notified  Home Living                                          Prior Functioning/Environment              Frequency  Min 1X/week        Progress Toward Goals  OT Goals(current goals can now be found in the care plan section)  Progress towards OT goals: Progressing toward goals  Acute Rehab OT Goals Patient Stated Goal: To get stronger and go home OT Goal Formulation: With patient Time For Goal Achievement: 03/04/23 Potential to Achieve Goals: Good  Plan      Co-evaluation      Reason for Co-Treatment: For patient/therapist safety;To address functional/ADL transfers PT goals addressed during session: Mobility/safety with mobility OT goals addressed during session: ADL's and self-care      AM-PAC OT "6 Clicks" Daily Activity     Outcome Measure   Help from another person eating meals?: None Help from another person taking care of personal grooming?: A Little Help from another person toileting, which includes using  toliet, bedpan, or urinal?: A Little Help from another person bathing (including washing, rinsing, drying)?: A Little Help from another person to put on and taking off regular upper body clothing?: A Little Help from another person to put on and taking off regular lower body clothing?: A Little 6 Click Score: 19    End of Session Equipment Utilized During Treatment: Gait belt;Rolling walker (2 wheels);Other (comment)  OT Visit Diagnosis: Other abnormalities of gait and mobility (R26.89);Unsteadiness on feet (R26.81);Muscle weakness (generalized) (M62.81)   Activity Tolerance Patient tolerated treatment well   Patient Left with call bell/phone within reach;in chair (seated in WC with LLE propped)   Nurse Communication          Time: 1610-9604 OT Time Calculation (min): 28 min  Charges: OT General Charges $OT Visit: 1 Visit OT Treatments $Self Care/Home Management : 8-22 mins  Rockney Ghee, M.S., OTR/L 02/23/23, 2:38 PM

## 2023-02-23 NOTE — TOC Progression Note (Signed)
Transition of Care [facility]) - Progression Note    Patient Details  Name: Adam Howell MRN: [id number] Date of Birth: 01/06/1961  Transition of Care [facility]) CM/SW Contact  Chapman Fitch, RN Phone Number: 02/23/2023, 11:51 AM  Clinical Narrative:      Received call from insurance offering a peer to peer that would have to be completed by 4pm.  MD provided with peer to peer info      Expected Discharge Plan and Services                                               Social Determinants of Health (SDOH) Interventions SDOH Screenings   Food Insecurity: No Food Insecurity (02/09/2023)  Housing: Low Risk  (02/09/2023)  Transportation Needs: No Transportation Needs (02/09/2023)  Recent Concern: Transportation Needs - Unmet Transportation Needs (12/29/2022)  Utilities: Not At Risk (02/09/2023)  Alcohol Screen: Low Risk  (11/25/2021)  Depression (PHQ2-9): Low Risk  (12/29/2022)  Financial Resource Strain: Low Risk  (11/25/2021)  Physical Activity: Insufficiently Active (11/25/2021)  Social Connections: Moderately Isolated (12/29/2022)  Stress: No Stress Concern Present (11/25/2021)  Tobacco Use: Low Risk  (02/09/2023)    Readmission Risk Interventions    02/16/2023    6:21 PM  Readmission Risk Prevention Plan  Transportation Screening Complete  Palliative Care Screening Not Applicable  Medication Review (RN Care Manager) Complete

## 2023-02-23 NOTE — TOC Progression Note (Signed)
Transition of Care [facility]) - Progression Note    Patient Details  Name: Adam Howell MRN: [id number] Date of Birth: 1960-12-02  Transition of Care [facility]) CM/SW Contact  Chapman Fitch, RN Phone Number: 02/23/2023, 10:26 AM  Clinical Narrative:        Insurance auth for SNF pending.  Per insurance rep going to [employer] geologist for review    Expected Discharge Plan and Services                                               Social Determinants of Health (SDOH) Interventions SDOH Screenings   Food Insecurity: No Food Insecurity (02/09/2023)  Housing: Low Risk  (02/09/2023)  Transportation Needs: No Transportation Needs (02/09/2023)  Recent Concern: Transportation Needs - Unmet Transportation Needs (12/29/2022)  Utilities: Not At Risk (02/09/2023)  Alcohol Screen: Low Risk  (11/25/2021)  Depression (PHQ2-9): Low Risk  (12/29/2022)  Financial Resource Strain: Low Risk  (11/25/2021)  Physical Activity: Insufficiently Active (11/25/2021)  Social Connections: Moderately Isolated (12/29/2022)  Stress: No Stress Concern Present (11/25/2021)  Tobacco Use: Low Risk  (02/09/2023)    Readmission Risk Interventions    02/16/2023    6:21 PM  Readmission Risk Prevention Plan  Transportation Screening Complete  Palliative Care Screening Not Applicable  Medication Review (RN Care Manager) Complete

## 2023-02-23 NOTE — Progress Notes (Signed)
Physical Therapy Treatment Patient Details Name: Adam Howell MRN: [id number] DOB: Sep 13, 1960 Today's Date: 02/23/2023   History of Present Illness 62 y.o. male presented to the ED from home with left leg pain and swelling and a wound with foul discharge. He has had a wound for many months now. S/p angiogram with percutaneous transluminal angioplasty of L posterior tibial artery and left peroneal artery on Monday.PMHx: ESRD on dialysis, Anemia, a-fib, CHF, chronic pain, DM, Chronic pain syndrome, Rt AKA, HTN CAD.    PT Comments  Pt alert, agreeable to PT/OT co-treat to maximize function/safety. The pt demonstrated bed mobility modI, good sitting balance EOB and on commode (able to perform pericare modI). To lateral scoot to WC CGAx2. Able to stand pivot from [facility] to standard commode but heavy reliance on BUE support (railing in bathroom, toilet fixture pipe behind commode). From [facility] he was also able to sit <> stand CGAx2, and hop forwards/backwards  ~86ft. Noted for very minimal foot clearance and complained of L foot pain, RN notified end of session. PT/OT spent time reviewing home set up and safety concerns; pt normally scoots upstairs on his bottom and stands at the top, and hop down the stairs. At this time pt would need physical assistance to safely complete stair navigation which is different than his baseline. Pt also reported concern about currently ability to do the stairs safely. Pts bedroom/bathroom is upstairs in his new apartment. The patient would benefit from further skilled PT intervention to continue to progress towards goals and to maximize independence and safety.      If plan is discharge home, recommend the following: A lot of help with walking and/or transfers;Help with stairs or ramp for entrance;Assist for transportation   Can travel by private vehicle     Yes  Equipment Recommendations  None recommended by PT    Recommendations for Other Services       Precautions  / Restrictions Precautions Precautions: Fall Restrictions Weight Bearing Restrictions: No     Mobility  Bed Mobility Overal bed mobility: Modified Independent                  Transfers Overall transfer level: Needs assistance Equipment used: None Transfers: Sit to/from Stand, Bed to chair/wheelchair/BSC Sit to Stand: Contact guard assist Stand pivot transfers: Contact guard assist, +2 safety/equipment        Lateral/Scoot Transfers: Contact guard assist General transfer comment: pt did need one reminder to lock his WC    Ambulation/Gait   Gait Distance (Feet): 2 Feet           General Gait Details: able to very minimally clear LLE from floor to hop forwards/backwards   Stairs             Wheelchair Mobility     Tilt Bed    Modified Rankin (Stroke Patients Only)       Balance Overall balance assessment: Needs assistance Sitting-balance support: Single extremity supported, Feet supported Sitting balance-Leahy Scale: Good     Standing balance support: Bilateral upper extremity supported, Reliant on assistive device for balance, During functional activity Standing balance-Leahy Scale: Poor                              Cognition Arousal: Alert Behavior During Therapy: WFL for tasks assessed/performed Overall Cognitive Status: Within Functional Limits for tasks assessed  Exercises      General Comments        Pertinent Vitals/Pain Pain Assessment Pain Assessment: 0-10 Pain Score: 8  Pain Location: LLE-foot Pain Descriptors / Indicators: Sore, Aching Pain Intervention(s): Limited activity within patient's tolerance, Monitored during session, Repositioned, Patient requesting pain meds-RN notified    Home Living                          Prior Function            PT Goals (current goals can now be found in the care plan section) Progress towards PT  goals: Progressing toward goals    Frequency    Min 1X/week      PT Plan      Co-evaluation PT/OT/SLP Co-Evaluation/Treatment: Yes Reason for Co-Treatment: For patient/therapist safety;To address functional/ADL transfers PT goals addressed during session: Mobility/safety with mobility OT goals addressed during session: ADL's and self-care      AM-PAC PT "6 Clicks" Mobility   Outcome Measure  Help needed turning from your back to your side while in a flat bed without using bedrails?: None Help needed moving from lying on your back to sitting on the side of a flat bed without using bedrails?: A Little Help needed moving to and from a bed to a chair (including a wheelchair)?: A Little Help needed standing up from a chair using your arms (e.g., wheelchair or bedside chair)?: A Little Help needed to walk in hospital room?: A Little Help needed climbing 3-5 steps with a railing? : Total 6 Click Score: 17    End of Session   Activity Tolerance: Patient tolerated treatment well Patient left: in chair;with bed alarm set Nurse Communication: Mobility status;Patient requests pain meds PT Visit Diagnosis: Other abnormalities of gait and mobility (R26.89);Muscle weakness (generalized) (M62.81);Difficulty in walking, not elsewhere classified (R26.2);Pain Pain - Right/Left: Left Pain - part of body: Leg     Time: 1478-2956 PT Time Calculation (min) (ACUTE ONLY): 28 min  Charges:    $Therapeutic Activity: 8-22 mins PT General Charges $$ ACUTE PT VISIT: 1 Visit                     Olga Coaster PT, DPT 2:10 PM,02/23/23

## 2023-02-23 NOTE — Discharge Planning (Signed)
Continuing to follow patient for OPHD. Patient remains current at: Outpatient facility:  [facility]  [address] [place], [place] [address] [phone]   Schedule: MWF 6:50am arrival for 7:10am chair time  Dimas Chyle Dialysis Coordinator II  [facility] Cell: [phone] eFax: [phone] [email or web address]

## 2023-02-23 NOTE — Progress Notes (Signed)
[facility]  ROUNDING NOTE   Subjective:   Adam Howell is a 62 y.o male with past medical history of diabetes, hypertension, CAD, Rt AKA, anemia, CHF, and ESRD on dialysis. Patient presents to ED with left leg pain and swelling. He has been admitted for Wound infection [T14.8XXA, L08.9] Left leg cellulitis [L03.116] Infected ulcer of skin (HCC) [L98.499, L08.9]  Patient known to our practice and receives outpatient dialysis treatments at [facility] on a MWF schedule, supervised by [facility] physicians.   Update: Patient seen resting in bed Continues to complain of diarrhea Dialysis received yesterday, tolerated well   Objective:  Vital signs in last 24 hours:  Temp:  [97.9 F (36.6 C)-98.2 F (36.8 C)] 97.9 F (36.6 C) (12/10 0759) Pulse Rate:  [53-70] 67 (12/10 0759) Resp:  [13-23] 18 (12/10 0759) BP: (116-155)/(65-97) 155/97 (12/10 0759) SpO2:  [90 %-100 %] 100 % (12/10 0759) Weight:  [103.1 kg] 103.1 kg (12/09 1350)  Weight change:  Filed Weights   02/17/23 1400 02/19/23 0914 02/22/23 1350  Weight: 103 kg 101.9 kg 103.1 kg    Intake/Output: I/O last 3 completed shifts: In: 360 [P.O.:360] Out: -    Intake/Output this shift:  No intake/output data recorded.  Physical Exam: General: NAD  Head: Normocephalic, atraumatic. Moist oral mucosal membranes  Eyes: Anicteric  Lungs:  Clear to auscultation, normal effort  Heart: Regular rate and rhythm  Abdomen:  Soft, nontender  Extremities: No peripheral edema.  Neurologic: Alert and oriented, moving all four extremities  Skin: LLE wound, ace bandage  Access: Lt AVF    Basic Metabolic Panel: Recent Labs  Lab 02/17/23 1041 02/18/23 0525 02/19/23 0424 02/20/23 0457 02/21/23 0432 02/22/23 0424 02/23/23 0513  NA 132*   < > 132* 133* 132* 133* 131*  K 4.6   < > 4.5 4.0 4.2 4.5 4.4  CL 93*   < > 94* 95* 93* 94* 94*  CO2 25   < > 26 27 26 27 27   GLUCOSE 101*   < > 95 95 90 97 111*  BUN 54*    < > 48* 34* 48* 58* 36*  CREATININE 11.02*   < > 10.35* 8.24* 10.83* 12.03* 9.01*  CALCIUM 8.2*   < > 8.2* 8.4* 8.2* 7.9* 7.8*  PHOS 5.4*  --   --   --   --   --   --    < > = values in this interval not displayed.    Liver Function Tests: Recent Labs  Lab 02/17/23 1041  ALBUMIN 2.6*   No results for input(s): "LIPASE", "AMYLASE" in the last 168 hours. No results for input(s): "AMMONIA" in the last 168 hours.  CBC: Recent Labs  Lab 02/19/23 0424 02/20/23 0457 02/21/23 0432 02/22/23 0424 02/22/23 1357 02/23/23 0513  WBC 6.8 6.7 6.0 7.4 6.0 6.3  NEUTROABS 4.5 4.3 3.5 4.6  --  3.9  HGB 12.9* 13.3 12.4* 12.1* 11.1* 11.1*  HCT 39.3 40.2 37.9* 36.4* 33.5* 33.5*  MCV 86.2 84.8 85.7 85.8 85.0 85.2  PLT 212 197 182 163 143* 144*    Cardiac Enzymes: No results for input(s): "CKTOTAL", "CKMB", "CKMBINDEX", "TROPONINI" in the last 168 hours.  BNP: Invalid input(s): "POCBNP"  CBG: No results for input(s): "GLUCAP" in the last 168 hours.   Microbiology: Results for orders placed or performed during the hospital encounter of 02/09/23  Culture, blood (routine x 2)     Status: None   Collection Time: 02/09/23 10:30 AM  Specimen: BLOOD  Result Value Ref Range Status   Specimen Description BLOOD RIGHT [facility]  Final   Special Requests BOTTLES DRAWN AEROBIC AND ANAEROBIC  Final   Culture   Final    NO GROWTH 5 DAYS Performed at [facility], [address] [place], [place] [address]    Report Status 02/14/2023 FINAL  Final  Culture, blood (routine x 2)     Status: None   Collection Time: 02/09/23  9:46 PM   Specimen: BLOOD  Result Value Ref Range Status   Specimen Description BLOOD RIGHT HAND  Final   Special Requests   Final    BOTTLES DRAWN AEROBIC AND ANAEROBIC Blood Culture adequate volume   Culture   Final    NO GROWTH 5 DAYS Performed at [facility], [address], [place], [place] [address]    Report Status 02/14/2023 FINAL  Final  Aerobic  Culture w Gram Stain (superficial specimen)     Status: None   Collection Time: 02/16/23  4:34 PM   Specimen: Leg; Wound  Result Value Ref Range Status   Specimen Description LEG  Final   Special Requests LEFT LEG  Final   Gram Stain NO WBC SEEN RARE GRAM POSITIVE COCCI IN PAIRS   Final   Culture   Final    RARE ENTEROCOCCUS FAECALIS RARE CORYNEBACTERIUM STRIATUM Standardized susceptibility testing for this organism is not available.    Report Status 02/20/2023 FINAL  Final   Organism ID, Bacteria ENTEROCOCCUS FAECALIS  Final      Susceptibility   Enterococcus faecalis - MIC*    AMPICILLIN <=2 SENSITIVE Sensitive     VANCOMYCIN 1 SENSITIVE Sensitive     GENTAMICIN SYNERGY RESISTANT Resistant     * RARE ENTEROCOCCUS FAECALIS    Coagulation Studies: No results for input(s): "LABPROT", "INR" in the last 72 hours.  Urinalysis: No results for input(s): "COLORURINE", "LABSPEC", "PHURINE", "GLUCOSEU", "HGBUR", "BILIRUBINUR", "KETONESUR", "PROTEINUR", "UROBILINOGEN", "NITRITE", "LEUKOCYTESUR" in the last 72 hours.  Invalid input(s): "APPERANCEUR"    Imaging: No results found.   Medications:    ceFEPime (MAXIPIME) IV 2 g (02/22/23 1849)   vancomycin Stopped (02/22/23 1832)    amiodarone  200 mg Oral BID   apixaban  2.5 mg Oral BID   aspirin EC  81 mg Oral Daily   calcitRIOL  1.5 mcg Oral Q M,W,F-1800   Chlorhexidine Gluconate Cloth  6 each Topical Q0600   cinacalcet  30 mg Oral Q breakfast   leptospermum manuka honey  1 Application Topical Daily   melatonin  2.5 mg Oral QHS   multivitamin  1 tablet Oral QHS   pregabalin  25 mg Oral Daily   rosuvastatin  40 mg Oral Daily   sevelamer carbonate  2,400 mg Oral TID WC   acetaminophen **OR** acetaminophen, hydrALAZINE, HYDROcodone-acetaminophen, loperamide, LORazepam, ondansetron **OR** ondansetron (ZOFRAN) IV  Assessment/ Plan:  Adam Howell is a 62 y.o.  male with past medical history of diabetes, hypertension,  CAD, Rt AKA, anemia, CHF, and ESRD on dialysis. Patient presents to ED with left leg pain and swelling. He has been admitted for Wound infection [T14.8XXA, L08.9] Left leg cellulitis [L03.116] Infected ulcer of skin (HCC) [L98.499, L08.9]  [facility]/MWF/Lt AVF  End stage renal disease on hemodialysis.  Next treatment scheduled for Wednesday.  2. Anemia of chronic kidney disease Lab Results  Component Value Date   HGB 11.1 (L) 02/23/2023    Hemoglobin within desired goal.  No need  of ESA at this time.  3. Wound infection, Left leg. Imaging negative for osteomyelitis. We feel wound is more likely arterial insuffiencey and not due to calciphylaxis.  Receiving cefepime and vancomycin.  Vascular ABI study normal. Vascular surgery performed angioplasty with intervention on 02/15/2023.  Will continue cefepime 2g and Vancomycin 1g with each dialysis until 02/24/23, per ID recommendation.   4. Secondary Hyperparathyroidism: with outpatient labs: PTH 558, phosphorus 7.0, calcium 8.2 on 01/20/23.   Lab Results  Component Value Date   CALCIUM 7.8 (L) 02/23/2023   PHOS 5.4 (H) 02/17/2023    Prescribed calcitriol, cinacalcet and sevelamer outpatient.  Currently receiving these during this admission.   LOS: 14 Gage Weant 12/10/20241:10 PM

## 2023-02-23 NOTE — Progress Notes (Signed)
Progress Note   Patient: Adam Howell RKY:[id number] DOB: [birth year] DOA: 02/09/2023     14 DOS: the patient was seen and examined on 02/23/2023     Brief hospital course: Keylon Wharff is 62 y.o. male male w/ PMH ESRD on HD MWF, anemia chronic kidney disease, type 2 diabetes, chronic pain syndrome, chronic HFrEF, a/p R AKA, HLD, HTN, CAD.  Presents to the emergency department from home with C/L left leg pain and swelling x 3 to 4 days, foul odor to wound and leaking fluid for past few days.  Has been seeing wound care for this, recently treated outpatient by his PCP with doxycycline but wound has gotten worse.    Patient is seen by vascular team, advised ABI and he had angiogram with percutaneous transluminal angioplasty of left posterior tibial artery and left peroneal artery on Monday. He is on broad spectrum IV antibiotic therapy.  According to infectious disease last dose is 02/24/2023   Assessment and Plan: Ulcer left lower extremity with infection. Patient does have draining, worsening pain. MRI left lower extremity negative for osteomyelitis. Cultures 12/24/22 - Staph, enterococcus, enterobacter. Continue vancomycin and cefepime as recommended by ID last dose 02/24/2023 Vascular surgery team performed angiogram with percutaneous transluminal angioplasty of left posterior tibial artery and left peroneal artery  PT OT has recommended skilled nursing facility Social services working on placement   Acute diarrhea WBC normal no fever no abdominal pain I discussed this with infectious disease and this is unlikely C. Difficile We will continue to monitor Continue as needed Imodium  ESRD on hemodialysis His usual schedule is Monday, Wednesday and Friday. Nephrology on board for dialysis needs.  Continue dialysis needs   Hyperkalemia Improved after hemodialysis   Chronic atrial fibrillation: Continue amiodarone, Eliquis therapy.   Chronic diastolic CHF No  exacerbation. GDMT limited due to low blood pressures.   Chronic pain syndrome: Continue home dose opiates, Lyrica therapy. Discontinued IV Dilaudid  continue norco as needed.   Type 2 diabetes mellitus: A1c 5.9. sugars stable. He wishes regular diet, changed to renal.   Chronic constipation: Due to chronic opiate use. Laxative discontinued as patient is having diarrhea   Obesity with BMI 32.5: Diet, exercise and weight reduction advised.     DVT prophylaxis   Code Status: Full Code   Subjective:  Patient seen and examined at bedside this morning Denies nausea vomiting abdominal pain chest pain cough   Physical Exam: General - Elderly obese African American male, no apparent distress HEENT - PERRLA, EOMI, atraumatic head, non tender sinuses. Lung - Clear, diffuse rales, rhonchi, no wheezes. Heart - S1, S2 heard, no murmurs, rubs, 1+ pedal edema. Abdomen - Soft, non tender obese, bowel sounds good Neuro - sleeping, arousable, non focal exam. Skin - Warm and dry. Right AKA, Left arm fistula, Left leg dressing noted.     Family Communication: Discussed with patient, he understand and agree. All questions answereed.   Disposition: Status is: Inpatient Remains inpatient appropriate because: wound infection, IV antibiotics. Vascular, ID follow up.  Planned Discharge Destination: Home with Home Health and Rehab     Time spent: 35 minutes   Data Reviewed:     Latest Ref Rng & Units 02/23/2023    5:13 AM 02/22/2023    4:24 AM 02/21/2023    4:32 AM  BMP  Glucose 70 - 99 mg/dL 315  97  90   BUN 8 - 23 mg/dL 36  58  48   Creatinine 0.61 -  1.24 mg/dL 6.04  54.09  81.19   Sodium 135 - 145 mmol/L 131  133  132   Potassium 3.5 - 5.1 mmol/L 4.4  4.5  4.2   Chloride 98 - 111 mmol/L 94  94  93   CO2 22 - 32 mmol/L 27  27  26    Calcium 8.9 - 10.3 mg/dL 7.8  7.9  8.2     Vitals:   02/22/23 1709 02/22/23 2003 02/23/23 0423 02/23/23 0759  BP: 125/79 127/75 122/65 (!) 155/97   Pulse: 64 (!) 53 70 67  Resp: 14 18 18 18   Temp: 98.2 F (36.8 C) 98 F (36.7 C) 98 F (36.7 C) 97.9 F (36.6 C)  TempSrc: Oral Oral Oral   SpO2: 97% 98% 100% 100%  Weight:      Height:          Latest Ref Rng & Units 02/23/2023    5:13 AM 02/22/2023    1:57 PM 02/22/2023    4:24 AM  CBC  WBC 4.0 - 10.5 K/uL 6.3  6.0  7.4   Hemoglobin 13.0 - 17.0 g/dL 14.7  82.9  56.2   Hematocrit 39.0 - 52.0 % 33.5  33.5  36.4   Platelets 150 - 400 K/uL 144  143  163      Author: Loyce Dys, MD 02/23/2023 5:25 PM  For on call review [email or web address].

## 2023-02-23 NOTE — TOC Progression Note (Signed)
Transition of Care [facility]) - Progression Note    Patient Details  Name: Adam Howell MRN: [id number] Date of Birth: September 21, 1960  [facility]) CM/SW Contact  Chapman Fitch, RN Phone Number: 02/23/2023, 2:44 PM  Clinical Narrative:      MD called to complete peer to peer. TOC was advised to upload wound notes and updated therapy note.  Both were uploaded to [facility] portal       Expected Discharge Plan and Services                                               Social Determinants of Health (SDOH) Interventions SDOH Screenings   Food Insecurity: No Food Insecurity (02/09/2023)  Housing: Low Risk  (02/09/2023)  Transportation Needs: No Transportation Needs (02/09/2023)  Recent Concern: Transportation Needs - Unmet Transportation Needs (12/29/2022)  Utilities: Not At Risk (02/09/2023)  Alcohol Screen: Low Risk  (11/25/2021)  Depression (PHQ2-9): Low Risk  (12/29/2022)  Financial Resource Strain: Low Risk  (11/25/2021)  Physical Activity: Insufficiently Active (11/25/2021)  Social Connections: Moderately Isolated (12/29/2022)  Stress: No Stress Concern Present (11/25/2021)  Tobacco Use: Low Risk  (02/09/2023)    Readmission Risk Interventions    02/16/2023    6:21 PM  Readmission Risk Prevention Plan  Transportation Screening Complete  Palliative Care Screening Not Applicable  Medication Review (RN Care Manager) Complete

## 2023-02-23 NOTE — Care Management Important Message (Signed)
Important Message  Patient Details  Name: Shigeto Schram MRN: [id number] Date of Birth: 13-Feb-1961   Important Message Given:  Yes - Medicare IM  Patient is in an isolation room so I reviewed his Important Message from Medicare with him by phone [phone]). He stated he understood his rights and I thanked him for his time.   Olegario Messier A Tanasia Budzinski 02/23/2023, 2:11 PM

## 2023-02-24 DIAGNOSIS — N186 End stage renal disease: Secondary | ICD-10-CM | POA: Diagnosis not present

## 2023-02-24 DIAGNOSIS — T148XXA Other injury of unspecified body region, initial encounter: Secondary | ICD-10-CM | POA: Diagnosis not present

## 2023-02-24 DIAGNOSIS — E1122 Type 2 diabetes mellitus with diabetic chronic kidney disease: Secondary | ICD-10-CM | POA: Diagnosis not present

## 2023-02-24 DIAGNOSIS — G894 Chronic pain syndrome: Secondary | ICD-10-CM | POA: Diagnosis not present

## 2023-02-24 LAB — CBC WITH DIFFERENTIAL/PLATELET
Abs Immature Granulocytes: 0.02 10*3/uL (ref 0.00–0.07)
Basophils Absolute: 0.1 10*3/uL (ref 0.0–0.1)
Basophils Relative: 1 %
Eosinophils Absolute: 0.4 10*3/uL (ref 0.0–0.5)
Eosinophils Relative: 6 %
HCT: 33.7 % — ABNORMAL LOW (ref 39.0–52.0)
Hemoglobin: 11.4 g/dL — ABNORMAL LOW (ref 13.0–17.0)
Immature Granulocytes: 0 %
Lymphocytes Relative: 20 %
Lymphs Abs: 1.3 10*3/uL (ref 0.7–4.0)
MCH: 28.5 pg (ref 26.0–34.0)
MCHC: 33.8 g/dL (ref 30.0–36.0)
MCV: 84.3 fL (ref 80.0–100.0)
Monocytes Absolute: 0.7 10*3/uL (ref 0.1–1.0)
Monocytes Relative: 11 %
Neutro Abs: 4 10*3/uL (ref 1.7–7.7)
Neutrophils Relative %: 62 %
Platelets: 147 10*3/uL — ABNORMAL LOW (ref 150–400)
RBC: 4 MIL/uL — ABNORMAL LOW (ref 4.22–5.81)
RDW: 14.6 % (ref 11.5–15.5)
WBC: 6.4 10*3/uL (ref 4.0–10.5)
nRBC: 0 % (ref 0.0–0.2)

## 2023-02-24 LAB — BASIC METABOLIC PANEL
Anion gap: 11 (ref 5–15)
BUN: 46 mg/dL — ABNORMAL HIGH (ref 8–23)
CO2: 25 mmol/L (ref 22–32)
Calcium: 8.2 mg/dL — ABNORMAL LOW (ref 8.9–10.3)
Chloride: 97 mmol/L — ABNORMAL LOW (ref 98–111)
Creatinine, Ser: 10.81 mg/dL — ABNORMAL HIGH (ref 0.61–1.24)
GFR, Estimated: 5 mL/min — ABNORMAL LOW (ref >60)
Glucose, Bld: 77 mg/dL (ref 70–99)
Potassium: 4.6 mmol/L (ref 3.5–5.1)
Sodium: 133 mmol/L — ABNORMAL LOW (ref 135–145)

## 2023-02-24 MED ORDER — HEPARIN SODIUM (PORCINE) 1000 UNIT/ML DIALYSIS: 1000.0000 [IU] | INTRAMUSCULAR | Status: DC | PRN

## 2023-02-24 MED ORDER — LIDOCAINE-PRILOCAINE 2.5-2.5 % EX CREA: 1.0000 | TOPICAL_CREAM | CUTANEOUS | Status: DC | PRN

## 2023-02-24 MED ORDER — PENTAFLUOROPROP-TETRAFLUOROETH EX AERO: 1.0000 | INHALATION_SPRAY | CUTANEOUS | Status: DC | PRN

## 2023-02-24 NOTE — Plan of Care (Signed)

## 2023-02-24 NOTE — Progress Notes (Signed)
Progress Note   Patient: Adam Howell NWG:[id number] DOB: 09-25-1960 DOA: 02/09/2023     15 DOS: the patient was seen and examined on 02/24/2023     Brief hospital course: Adam Howell is 62 y.o. male male w/ PMH ESRD on HD MWF, anemia chronic kidney disease, type 2 diabetes, chronic pain syndrome, chronic HFrEF, a/p R AKA, HLD, HTN, CAD.  Presents to the emergency department from home with C/L left leg pain and swelling x 3 to 4 days, foul odor to wound and leaking fluid for past few days.  Has been seeing wound care for this, recently treated outpatient by his PCP with doxycycline but wound has gotten worse.    Patient is seen by vascular team, advised ABI and he had angiogram with percutaneous transluminal angioplasty of left posterior tibial artery and left peroneal artery on Monday. He is on broad spectrum IV antibiotic therapy.  According to infectious disease last dose is 02/24/2023   Assessment and Plan: Ulcer left lower extremity with infection. Patient does have draining, worsening pain. MRI left lower extremity negative for osteomyelitis. Cultures 12/24/22 - Staph, enterococcus, enterobacter. Continue vancomycin and cefepime as recommended by ID last dose 02/24/2023 Vascular surgery team performed angiogram with percutaneous transluminal angioplasty of left posterior tibial artery and left peroneal artery  PT OT has recommended skilled nursing facility Social services working on placement   Acute diarrhea WBC normal no fever no abdominal pain I discussed this with infectious disease and this is unlikely C. Difficile We will continue to monitor Continue as needed Imodium  ESRD on hemodialysis His usual schedule is Monday, Wednesday and Friday. Nephrology on board for dialysis needs.  Continue dialysis needs   Hyperkalemia Improved after hemodialysis   Chronic atrial fibrillation: Continue amiodarone, Eliquis therapy.   Chronic diastolic CHF No  exacerbation. GDMT limited due to low blood pressures.   Chronic pain syndrome: Continue home dose opiates, Lyrica therapy. Discontinued IV Dilaudid  continue norco as needed.   Type 2 diabetes mellitus: A1c 5.9. sugars stable. He wishes regular diet, changed to renal.   Chronic constipation: Due to chronic opiate use. Laxative discontinued as patient is having diarrhea   Obesity with BMI 32.5: Diet, exercise and weight reduction advised.     DVT prophylaxis   Code Status: Full Code   Subjective:  Patient seen in dialysis this AM.  He reports some foot pain.  Having fecal incontinence episodes.   Physical Exam: General exam: awake, alert, no acute distress HEENT: moist mucus membranes, hearing grossly normal  Respiratory system: CTAB, no wheezes, rales or rhonchi, normal respiratory effort. Cardiovascular system: normal S1/S2, RRR Gastrointestinal system: soft, NT, ND Central nervous system: A&O x3. no gross focal neurologic deficits, normal speech Extremities: R AKA, LUE fistula Skin: dry, intact, normal temperature Psychiatry: normal mood, congruent affect, judgement and insight appear normal      Family Communication: Discussed with patient   Disposition: Status is: Inpatient Remains inpatient appropriate because: wound infection, IV antibiotics. Vascular, ID follow up.    Planned Discharge Destination: Home with Home Health and Rehab     Time spent: 35 minutes   Data Reviewed:     Latest Ref Rng & Units 02/24/2023    3:32 AM 02/23/2023    5:13 AM 02/22/2023    4:24 AM  BMP  Glucose 70 - 99 mg/dL 77  578  97   BUN 8 - 23 mg/dL 46  36  58   Creatinine 0.61 - 1.24 mg/dL  10.81  9.01  12.03   Sodium 135 - 145 mmol/L 133  131  133   Potassium 3.5 - 5.1 mmol/L 4.6  4.4  4.5   Chloride 98 - 111 mmol/L 97  94  94   CO2 22 - 32 mmol/L 25  27  27    Calcium 8.9 - 10.3 mg/dL 8.2  7.8  7.9     Vitals:   02/24/23 1130 02/24/23 1200 02/24/23 1210 02/24/23 1230   BP: (!) 118/57 121/67 117/61 115/60  Pulse: 60 66 73 (!) 39  Resp: 19 13 19 15   Temp:   97.8 F (36.6 C)   TempSrc:   Oral   SpO2: 99% 95% 97%   Weight:    103.2 kg  Height:          Latest Ref Rng & Units 02/24/2023    3:32 AM 02/23/2023    5:13 AM 02/22/2023    1:57 PM  CBC  WBC 4.0 - 10.5 K/uL 6.4  6.3  6.0   Hemoglobin 13.0 - 17.0 g/dL 16.1  09.6  04.5   Hematocrit 39.0 - 52.0 % 33.7  33.5  33.5   Platelets 150 - 400 K/uL 147  144  143      Author: Pennie Banter, DO 02/24/2023 2:02 PM  For on call review [email or web address].

## 2023-02-24 NOTE — Progress Notes (Addendum)
[facility]  ROUNDING NOTE   Subjective:   Adam Howell is a 62 y.o male with past medical history of diabetes, hypertension, CAD, Rt AKA, anemia, CHF, and ESRD on dialysis. Patient presents to ED with left leg pain and swelling. He has been admitted for Wound infection [T14.8XXA, L08.9] Left leg cellulitis [L03.116] Infected ulcer of skin (HCC) [L98.499, L08.9]  Patient known to our practice and receives outpatient dialysis treatments at [facility] on a MWF schedule, supervised by [facility] physicians.   Update: Patient seen and evaluated during dialysis   HEMODIALYSIS FLOWSHEET:  Blood Flow Rate (mL/min): 399 mL/min (Simultaneous filing. User may not have seen previous data.) Arterial Pressure (mmHg): -245.24 mmHg (Simultaneous filing. User may not have seen previous data.) Venous Pressure (mmHg): 220.19 mmHg (Simultaneous filing. User may not have seen previous data.) TMP (mmHg): 4.24 mmHg (Simultaneous filing. User may not have seen previous data.) Ultrafiltration Rate (mL/min): 766 mL/min (Simultaneous filing. User may not have seen previous data.) Dialysate Flow Rate (mL/min): 299 ml/min (Simultaneous filing. User may not have seen previous data.)  Continues to have diarrhea   Objective:  Vital signs in last 24 hours:  Temp:  [98.2 F (36.8 C)-99.2 F (37.3 C)] 98.2 F (36.8 C) (12/11 0800) Pulse Rate:  [43-70] 67 (12/11 1100) Resp:  [15-22] 15 (12/11 1100) BP: (89-158)/(57-98) 115/98 (12/11 1100) SpO2:  [95 %-100 %] 95 % (12/11 1100) Weight:  [103 kg] 103 kg (12/11 0800)  Weight change:  Filed Weights   02/19/23 0914 02/22/23 1350 02/24/23 0800  Weight: 101.9 kg 103.1 kg 103 kg    Intake/Output: I/O last 3 completed shifts: In: 240 [P.O.:240] Out: -    Intake/Output this shift:  No intake/output data recorded.  Physical Exam: General: NAD  Head: Normocephalic, atraumatic. Moist oral mucosal membranes  Eyes: Anicteric  Lungs:  Clear to  auscultation, normal effort  Heart: Regular rate and rhythm  Abdomen:  Soft, nontender  Extremities: No peripheral edema.  Neurologic: Alert and oriented, moving all four extremities  Skin: LLE wound, ace bandage  Access: Lt AVF    Basic Metabolic Panel: Recent Labs  Lab 02/20/23 0457 02/21/23 0432 02/22/23 0424 02/23/23 0513 02/24/23 0332  NA 133* 132* 133* 131* 133*  K 4.0 4.2 4.5 4.4 4.6  CL 95* 93* 94* 94* 97*  CO2 27 26 27 27 25   GLUCOSE 95 90 97 111* 77  BUN 34* 48* 58* 36* 46*  CREATININE 8.24* 10.83* 12.03* 9.01* 10.81*  CALCIUM 8.4* 8.2* 7.9* 7.8* 8.2*    Liver Function Tests: No results for input(s): "AST", "ALT", "ALKPHOS", "BILITOT", "PROT", "ALBUMIN" in the last 168 hours.  No results for input(s): "LIPASE", "AMYLASE" in the last 168 hours. No results for input(s): "AMMONIA" in the last 168 hours.  CBC: Recent Labs  Lab 02/20/23 0457 02/21/23 0432 02/22/23 0424 02/22/23 1357 02/23/23 0513 02/24/23 0332  WBC 6.7 6.0 7.4 6.0 6.3 6.4  NEUTROABS 4.3 3.5 4.6  --  3.9 4.0  HGB 13.3 12.4* 12.1* 11.1* 11.1* 11.4*  HCT 40.2 37.9* 36.4* 33.5* 33.5* 33.7*  MCV 84.8 85.7 85.8 85.0 85.2 84.3  PLT 197 182 163 143* 144* 147*    Cardiac Enzymes: No results for input(s): "CKTOTAL", "CKMB", "CKMBINDEX", "TROPONINI" in the last 168 hours.  BNP: Invalid input(s): "POCBNP"  CBG: No results for input(s): "GLUCAP" in the last 168 hours.   Microbiology: Results for orders placed or performed during the hospital encounter of 02/09/23  Culture, blood (routine  x 2)     Status: None   Collection Time: 02/09/23 10:30 AM   Specimen: BLOOD  Result Value Ref Range Status   Specimen Description BLOOD RIGHT [facility]  Final   Special Requests BOTTLES DRAWN AEROBIC AND ANAEROBIC  Final   Culture   Final    NO GROWTH 5 DAYS Performed at [facility], [address] [place], [place] [address]    Report Status 02/14/2023 FINAL  Final  Culture, blood (routine x 2)      Status: None   Collection Time: 02/09/23  9:46 PM   Specimen: BLOOD  Result Value Ref Range Status   Specimen Description BLOOD RIGHT HAND  Final   Special Requests   Final    BOTTLES DRAWN AEROBIC AND ANAEROBIC Blood Culture adequate volume   Culture   Final    NO GROWTH 5 DAYS Performed at [facility], [address] [place], [place] [address]    Report Status 02/14/2023 FINAL  Final  Aerobic Culture w Gram Stain (superficial specimen)     Status: None   Collection Time: 02/16/23  4:34 PM   Specimen: Leg; Wound  Result Value Ref Range Status   Specimen Description LEG  Final   Special Requests LEFT LEG  Final   Gram Stain NO WBC SEEN RARE GRAM POSITIVE COCCI IN PAIRS   Final   Culture   Final    RARE ENTEROCOCCUS FAECALIS RARE CORYNEBACTERIUM STRIATUM Standardized susceptibility testing for this organism is not available.    Report Status 02/20/2023 FINAL  Final   Organism ID, Bacteria ENTEROCOCCUS FAECALIS  Final      Susceptibility   Enterococcus faecalis - MIC*    AMPICILLIN <=2 SENSITIVE Sensitive     VANCOMYCIN 1 SENSITIVE Sensitive     GENTAMICIN SYNERGY RESISTANT Resistant     * RARE ENTEROCOCCUS FAECALIS    Coagulation Studies: No results for input(s): "LABPROT", "INR" in the last 72 hours.  Urinalysis: No results for input(s): "COLORURINE", "LABSPEC", "PHURINE", "GLUCOSEU", "HGBUR", "BILIRUBINUR", "KETONESUR", "PROTEINUR", "UROBILINOGEN", "NITRITE", "LEUKOCYTESUR" in the last 72 hours.  Invalid input(s): "APPERANCEUR"    Imaging: No results found.   Medications:    ceFEPime (MAXIPIME) IV 2 g (02/24/23 1136)    amiodarone  200 mg Oral BID   apixaban  2.5 mg Oral BID   aspirin EC  81 mg Oral Daily   calcitRIOL  1.5 mcg Oral Q M,W,F-1800   cinacalcet  30 mg Oral Q breakfast   leptospermum manuka honey  1 Application Topical Daily   melatonin  2.5 mg Oral QHS   multivitamin  1 tablet Oral QHS   pregabalin  25 mg Oral Daily    rosuvastatin  40 mg Oral Daily   sevelamer carbonate  2,400 mg Oral TID WC   acetaminophen **OR** acetaminophen, heparin, hydrALAZINE, HYDROcodone-acetaminophen, lidocaine-prilocaine, loperamide, LORazepam, ondansetron **OR** ondansetron (ZOFRAN) IV, pentafluoroprop-tetrafluoroeth  Assessment/ Plan:  Adam Howell is a 62 y.o.  male with past medical history of diabetes, hypertension, CAD, Rt AKA, anemia, CHF, and ESRD on dialysis. Patient presents to ED with left leg pain and swelling. He has been admitted for Wound infection [T14.8XXA, L08.9] Left leg cellulitis [L03.116] Infected ulcer of skin (HCC) [L98.499, L08.9]  [facility]/MWF/Lt AVF  End stage renal disease on hemodialysis.  Receiving dialysis today, UF goal 2L as tolerated. Next treatment scheduled for Friday.   2. Anemia of chronic kidney disease Lab Results  Component Value Date   HGB  11.4 (L) 02/24/2023    Hemoglobin acceptable.  No need of ESA at this time.  3. Wound infection, Left leg. Imaging negative for osteomyelitis. We feel wound is more likely arterial insuffiencey and not due to calciphylaxis.  Receiving cefepime and vancomycin.  Vascular ABI study normal. Vascular surgery performed angioplasty with intervention on 02/15/2023.  Will complete cefepime 2g and Vancomycin 1g with dialysis today  4. Secondary Hyperparathyroidism: with outpatient labs: PTH 558, phosphorus 7.0, calcium 8.2 on 01/20/23.   Lab Results  Component Value Date   CALCIUM 8.2 (L) 02/24/2023   PHOS 5.4 (H) 02/17/2023    Prescribed calcitriol, cinacalcet and sevelamer outpatient. Continued during admission. Bone minerals acceptable.    LOS: 15 Quynh Basso 12/11/202411:39 AM

## 2023-02-24 NOTE — Progress Notes (Signed)
PHARMACY CONSULT NOTE FOR:  OUTPATIENT  PARENTERAL ANTIBIOTIC THERAPY (OPAT)  Indication: leg wound Regimen: vancomycin 1gm IV MWF with HD and Cefepime 2gm IV MWF with HD End date: 02/24/2023  ABOVE ORDERS REMOVED AS PATIENT COMPLETED ANTIBIOTIC COURSE TODAY AS ORDERED BY ID  - Also informed nephrology NP that he completed course and will not need IV antibiotics as outpatient at HD center  IV antibiotic discharge orders are pended. To discharging provider:  please sign these orders via discharge navigator,  Select New Orders & click on the button choice - Manage This Unsigned Work.     Thank you for allowing pharmacy to be a part of this patient's care.  Juliette Alcide, PharmD, BCPS, BCIDP Work Cell: [phone] 02/24/2023 1:00 PM

## 2023-02-24 NOTE — Progress Notes (Signed)
Patient stated that he called his insurance company and they stated that they didn't know he was in the hospital and they had no records of a denial

## 2023-02-24 NOTE — Progress Notes (Signed)
  Received patient in bed to unit.   Informed consent signed and in chart.    TX duration:3.5HRS     Transported back to floor  Hand-off given to patient's nurse. Pt c/o 8/10 L foot pain  Primary nurse made aware    Access used: L AVF Access issues: none   Total UF removed: 2.0L Medication(s) given: vancomycin, cefepime  Post HD weight: 101.0kg     Lynann Beaver  [facility]

## 2023-02-24 NOTE — TOC Progression Note (Signed)
Transition of Care [facility]) - Progression Note    Patient Details  Name: Adam Howell MRN: [id number] Date of Birth: 12/10/1960  [facility]) CM/SW Contact  Chapman Fitch, RN Phone Number: 02/24/2023, 3:07 PM  Clinical Narrative:          Received denial from insurance for SNF.   Patient states that he disagrees and still feels like he needs SNF for rehab at discharge.  Patient to call [phone] and request expedited appeal   Expected Discharge Plan and Services                                               Social Determinants of Health (SDOH) Interventions SDOH Screenings   Food Insecurity: No Food Insecurity (02/09/2023)  Housing: Low Risk  (02/09/2023)  Transportation Needs: No Transportation Needs (02/09/2023)  Recent Concern: Transportation Needs - Unmet Transportation Needs (12/29/2022)  Utilities: Not At Risk (02/09/2023)  Alcohol Screen: Low Risk  (11/25/2021)  Depression (PHQ2-9): Low Risk  (12/29/2022)  Financial Resource Strain: Low Risk  (11/25/2021)  Physical Activity: Insufficiently Active (11/25/2021)  Social Connections: Moderately Isolated (12/29/2022)  Stress: No Stress Concern Present (11/25/2021)  Tobacco Use: Low Risk  (02/09/2023)    Readmission Risk Interventions    02/16/2023    6:21 PM  Readmission Risk Prevention Plan  Transportation Screening Complete  Palliative Care Screening Not Applicable  Medication Review (RN Care Manager) Complete

## 2023-02-25 DIAGNOSIS — T148XXA Other injury of unspecified body region, initial encounter: Secondary | ICD-10-CM | POA: Diagnosis not present

## 2023-02-25 DIAGNOSIS — N186 End stage renal disease: Secondary | ICD-10-CM | POA: Diagnosis not present

## 2023-02-25 DIAGNOSIS — I482 Chronic atrial fibrillation, unspecified: Secondary | ICD-10-CM | POA: Diagnosis not present

## 2023-02-25 DIAGNOSIS — E1122 Type 2 diabetes mellitus with diabetic chronic kidney disease: Secondary | ICD-10-CM | POA: Diagnosis not present

## 2023-02-25 NOTE — Plan of Care (Signed)

## 2023-02-25 NOTE — Progress Notes (Signed)
PT Cancellation Note  Patient Details Name: Adam Howell MRN: [id number] DOB: 1961-01-26   Cancelled Treatment:    Reason Eval/Treat Not Completed: Other (comment): Patient refused therapy session due to pain in LLE. RN notified of pain level and request for medication. Will re-attempt at later date/time.    [name], PT, DPT 02/25/23 3:33 PM

## 2023-02-25 NOTE — TOC Progression Note (Signed)
Transition of Care [facility]) - Progression Note    Patient Details  Name: Adam Howell MRN: [id number] Date of Birth: 08/20/1960  Transition of Care [facility]) CM/SW Contact  Chapman Fitch, RN Phone Number: 02/25/2023, 9:24 AM  Clinical Narrative:     Appeal pending for insurance determination to deny SNF at [facility]        Expected Discharge Plan and Services                                               Social Determinants of Health (SDOH) Interventions SDOH Screenings   Food Insecurity: No Food Insecurity (02/09/2023)  Housing: Low Risk  (02/09/2023)  Transportation Needs: No Transportation Needs (02/09/2023)  Recent Concern: Transportation Needs - Unmet Transportation Needs (12/29/2022)  Utilities: Not At Risk (02/09/2023)  Alcohol Screen: Low Risk  (11/25/2021)  Depression (PHQ2-9): Low Risk  (12/29/2022)  Financial Resource Strain: Low Risk  (11/25/2021)  Physical Activity: Insufficiently Active (11/25/2021)  Social Connections: Moderately Isolated (12/29/2022)  Stress: No Stress Concern Present (11/25/2021)  Tobacco Use: Low Risk  (02/09/2023)    Readmission Risk Interventions    02/16/2023    6:21 PM  Readmission Risk Prevention Plan  Transportation Screening Complete  Palliative Care Screening Not Applicable  Medication Review (RN Care Manager) Complete

## 2023-02-25 NOTE — Progress Notes (Signed)
[facility]  ROUNDING NOTE   Subjective:   Adam Howell is a 62 y.o male with past medical history of diabetes, hypertension, CAD, Rt AKA, anemia, CHF, and ESRD on dialysis. Patient presents to ED with left leg pain and swelling. He has been admitted for Wound infection [T14.8XXA, L08.9] Left leg cellulitis [L03.116] Infected ulcer of skin (HCC) [L98.499, L08.9]  Patient known to our practice and receives outpatient dialysis treatments at [facility] on a MWF schedule, supervised by [facility] physicians.   Update: Patient seen laying in bed Diarrhea decreasing   Objective:  Vital signs in last 24 hours:  Temp:  [98.3 F (36.8 C)-98.9 F (37.2 C)] 98.3 F (36.8 C) (12/12 0909) Pulse Rate:  [57-68] 68 (12/12 0909) Resp:  [16-19] 16 (12/12 0909) BP: (105-135)/(55-76) 123/71 (12/12 0909) SpO2:  [97 %-100 %] 97 % (12/12 0909)  Weight change:  Filed Weights   02/22/23 1350 02/24/23 0800 02/24/23 1230  Weight: 103.1 kg 103 kg 103.2 kg    Intake/Output: I/O last 3 completed shifts: In: 480 [P.O.:480] Out: 2000 [Other:2000]   Intake/Output this shift:  No intake/output data recorded.  Physical Exam: General: NAD  Head: Normocephalic, atraumatic. Moist oral mucosal membranes  Eyes: Anicteric  Lungs:  Clear to auscultation, normal effort  Heart: Regular rate and rhythm  Abdomen:  Soft, nontender  Extremities: No peripheral edema.  Neurologic: Alert and oriented, moving all four extremities  Skin: LLE wound, ace bandage  Access: Lt AVF    Basic Metabolic Panel: Recent Labs  Lab 02/20/23 0457 02/21/23 0432 02/22/23 0424 02/23/23 0513 02/24/23 0332  NA 133* 132* 133* 131* 133*  K 4.0 4.2 4.5 4.4 4.6  CL 95* 93* 94* 94* 97*  CO2 27 26 27 27 25   GLUCOSE 95 90 97 111* 77  BUN 34* 48* 58* 36* 46*  CREATININE 8.24* 10.83* 12.03* 9.01* 10.81*  CALCIUM 8.4* 8.2* 7.9* 7.8* 8.2*    Liver Function Tests: No results for input(s): "AST", "ALT",  "ALKPHOS", "BILITOT", "PROT", "ALBUMIN" in the last 168 hours.  No results for input(s): "LIPASE", "AMYLASE" in the last 168 hours. No results for input(s): "AMMONIA" in the last 168 hours.  CBC: Recent Labs  Lab 02/20/23 0457 02/21/23 0432 02/22/23 0424 02/22/23 1357 02/23/23 0513 02/24/23 0332  WBC 6.7 6.0 7.4 6.0 6.3 6.4  NEUTROABS 4.3 3.5 4.6  --  3.9 4.0  HGB 13.3 12.4* 12.1* 11.1* 11.1* 11.4*  HCT 40.2 37.9* 36.4* 33.5* 33.5* 33.7*  MCV 84.8 85.7 85.8 85.0 85.2 84.3  PLT 197 182 163 143* 144* 147*    Cardiac Enzymes: No results for input(s): "CKTOTAL", "CKMB", "CKMBINDEX", "TROPONINI" in the last 168 hours.  BNP: Invalid input(s): "POCBNP"  CBG: No results for input(s): "GLUCAP" in the last 168 hours.   Microbiology: Results for orders placed or performed during the hospital encounter of 02/09/23  Culture, blood (routine x 2)     Status: None   Collection Time: 02/09/23 10:30 AM   Specimen: BLOOD  Result Value Ref Range Status   Specimen Description BLOOD RIGHT [facility]  Final   Special Requests BOTTLES DRAWN AEROBIC AND ANAEROBIC  Final   Culture   Final    NO GROWTH 5 DAYS Performed at [facility], [address], [place], [place] [address]    Report Status 02/14/2023 FINAL  Final  Culture, blood (routine x 2)     Status: None   Collection Time: 02/09/23  9:46 PM   Specimen:  BLOOD  Result Value Ref Range Status   Specimen Description BLOOD RIGHT HAND  Final   Special Requests   Final    BOTTLES DRAWN AEROBIC AND ANAEROBIC Blood Culture adequate volume   Culture   Final    NO GROWTH 5 DAYS Performed at [facility], [address] [place], [place] [address]    Report Status 02/14/2023 FINAL  Final  Aerobic Culture w Gram Stain (superficial specimen)     Status: None   Collection Time: 02/16/23  4:34 PM   Specimen: Leg; Wound  Result Value Ref Range Status   Specimen Description LEG  Final   Special Requests LEFT LEG  Final   Gram  Stain NO WBC SEEN RARE GRAM POSITIVE COCCI IN PAIRS   Final   Culture   Final    RARE ENTEROCOCCUS FAECALIS RARE CORYNEBACTERIUM STRIATUM Standardized susceptibility testing for this organism is not available.    Report Status 02/20/2023 FINAL  Final   Organism ID, Bacteria ENTEROCOCCUS FAECALIS  Final      Susceptibility   Enterococcus faecalis - MIC*    AMPICILLIN <=2 SENSITIVE Sensitive     VANCOMYCIN 1 SENSITIVE Sensitive     GENTAMICIN SYNERGY RESISTANT Resistant     * RARE ENTEROCOCCUS FAECALIS    Coagulation Studies: No results for input(s): "LABPROT", "INR" in the last 72 hours.  Urinalysis: No results for input(s): "COLORURINE", "LABSPEC", "PHURINE", "GLUCOSEU", "HGBUR", "BILIRUBINUR", "KETONESUR", "PROTEINUR", "UROBILINOGEN", "NITRITE", "LEUKOCYTESUR" in the last 72 hours.  Invalid input(s): "APPERANCEUR"    Imaging: No results found.   Medications:      amiodarone  200 mg Oral BID   apixaban  2.5 mg Oral BID   aspirin EC  81 mg Oral Daily   calcitRIOL  1.5 mcg Oral Q M,W,F-1800   cinacalcet  30 mg Oral Q breakfast   leptospermum manuka honey  1 Application Topical Daily   melatonin  2.5 mg Oral QHS   multivitamin  1 tablet Oral QHS   pregabalin  25 mg Oral Daily   rosuvastatin  40 mg Oral Daily   sevelamer carbonate  2,400 mg Oral TID WC   acetaminophen **OR** acetaminophen, hydrALAZINE, HYDROcodone-acetaminophen, loperamide, LORazepam, ondansetron **OR** ondansetron (ZOFRAN) IV  Assessment/ Plan:  Adam Howell is a 62 y.o.  male with past medical history of diabetes, hypertension, CAD, Rt AKA, anemia, CHF, and ESRD on dialysis. Patient presents to ED with left leg pain and swelling. He has been admitted for Wound infection [T14.8XXA, L08.9] Left leg cellulitis [L03.116] Infected ulcer of skin (HCC) [L98.499, L08.9]  [facility]/MWF/Lt AVF  End stage renal disease on hemodialysis.  2l fluid removal achieved with dialysis yesterday.   Next treatment scheduled for Friday.   2. Anemia of chronic kidney disease Lab Results  Component Value Date   HGB 11.4 (L) 02/24/2023    Hemoglobin at goal.  No need of ESA at this time.  3. Wound infection, Left leg. Imaging negative for osteomyelitis. We feel wound is more likely arterial insuffiencey and not due to calciphylaxis.  Receiving cefepime and vancomycin.  Vascular ABI study normal. Vascular surgery performed angioplasty with intervention on 02/15/2023.  Cefepime 2g and Vancomycin 1g completed  4. Secondary Hyperparathyroidism: with outpatient labs: PTH 558, phosphorus 7.0, calcium 8.2 on 01/20/23.   Lab Results  Component Value Date   CALCIUM 8.2 (L) 02/24/2023   PHOS 5.4 (H) 02/17/2023    Prescribed calcitriol, cinacalcet and sevelamer outpatient. Continued during admission.  LOS: 16 Sanjay Broadfoot 12/12/20241:50 PM

## 2023-02-25 NOTE — Progress Notes (Signed)
Progress Note   Patient: Adam Howell VHQ:[id number] DOB: [birth year] DOA: 02/09/2023     16 DOS: the patient was seen and examined on 02/25/2023     Brief hospital course: "Trevaughn Mullane is 62 y.o. male male w/ PMH ESRD on HD MWF, anemia chronic kidney disease, type 2 diabetes, chronic pain syndrome, chronic HFrEF, a/p R AKA, HLD, HTN, CAD.  Presents to the emergency department from home with C/L left leg pain and swelling x 3 to 4 days, foul odor to wound and leaking fluid for past few days.  Has been seeing wound care for this, recently treated outpatient by his PCP with doxycycline but wound has gotten worse.    Patient is seen by vascular team, advised ABI and he had angiogram with percutaneous transluminal angioplasty of left posterior tibial artery and left peroneal artery on Monday. He is on broad spectrum IV antibiotic therapy.  According to infectious disease last dose is 02/24/2023" See H&P for full HPI on admission & ED course.  Further hospital course and management as outlined below.    Assessment and Plan: Ulcer left lower extremity with infection. Patient does have draining, worsening pain. MRI left lower extremity negative for osteomyelitis. Cultures 12/24/22 - Staph, enterococcus, enterobacter. Continue vancomycin and cefepime as recommended by ID last dose 02/24/2023 Vascular surgery team performed angiogram with percutaneous transluminal angioplasty of left posterior tibial artery and left peroneal artery  PT OT has recommended skilled nursing facility Social services working on placement.  Insurance denied authorization for SNF. Pt is appealing that decision.   Acute diarrhea WBC normal no fever no abdominal pain Dr. Meriam Sprague discussed with infectious disease, felt very unlikely C. Difficile Continue as needed Imodium Monitor  ESRD on hemodialysis His usual schedule is Monday, Wednesday and Friday. Nephrology on board for dialysis needs.  Continue dialysis  needs   Hyperkalemia Improved after hemodialysis   Chronic atrial fibrillation: Continue amiodarone, Eliquis therapy.   Chronic diastolic CHF No exacerbation. GDMT limited due to low blood pressures.   Chronic pain syndrome: Continue home dose opiates, Lyrica therapy. Discontinued IV Dilaudid  continue norco as needed.   Type 2 diabetes mellitus: A1c 5.9. sugars stable. He wishes regular diet, changed to renal.   Chronic constipation: Due to chronic opiate use. Laxative discontinued due to diarrhea   Obesity with BMI 32.5: Diet, exercise and weight reduction advised.     DVT prophylaxis   Code Status: Full Code     Subjective:  Patient seen awake resting in bed this AM.  He reports foot pain improved and controlled with medication.  Denies other acute complaints.       Physical Exam: General exam: awake, alert, no acute distress Respiratory system: on room air, normal respiratory effort. Cardiovascular system: RRR Gastrointestinal system: soft, NT, ND Central nervous system: A&O x3. no gross focal neurologic deficits, normal speech Extremities: R AKA, LUE fistula Skin: dry, intact, normal temperature Psychiatry: normal mood, congruent affect, judgement and insight appear normal      Family Communication: Discussed with patient   Disposition: Status is: Inpatient Remains inpatient appropriate because: awaiting appeal of SNF denial    Planned Discharge Destination: SNF -- auth denied, appeal pending     Time spent: 35 minutes   Data Reviewed:     Latest Ref Rng & Units 02/24/2023    3:32 AM 02/23/2023    5:13 AM 02/22/2023    4:24 AM  BMP  Glucose 70 - 99 mg/dL 77  413  97   BUN 8 - 23 mg/dL 46  36  58   Creatinine 0.61 - 1.24 mg/dL 57.84  6.96  29.52   Sodium 135 - 145 mmol/L 133  131  133   Potassium 3.5 - 5.1 mmol/L 4.6  4.4  4.5   Chloride 98 - 111 mmol/L 97  94  94   CO2 22 - 32 mmol/L 25  27  27    Calcium 8.9 - 10.3 mg/dL 8.2  7.8  7.9      Vitals:   02/24/23 1654 02/24/23 1933 02/25/23 0254 02/25/23 0909  BP: 123/74 (!) 105/55 135/76 123/71  Pulse: 68 (!) 57 63 68  Resp:  19 18 16   Temp: 98.8 F (37.1 C) 98.9 F (37.2 C) 98.4 F (36.9 C) 98.3 F (36.8 C)  TempSrc: Oral Oral Oral Oral  SpO2: 100% 99% 100% 97%  Weight:      Height:          Latest Ref Rng & Units 02/24/2023    3:32 AM 02/23/2023    5:13 AM 02/22/2023    1:57 PM  CBC  WBC 4.0 - 10.5 K/uL 6.4  6.3  6.0   Hemoglobin 13.0 - 17.0 g/dL 84.1  32.4  40.1   Hematocrit 39.0 - 52.0 % 33.7  33.5  33.5   Platelets 150 - 400 K/uL 147  144  143      Author: Pennie Banter, DO 02/25/2023 11:57 AM  For on call review [email or web address].

## 2023-02-26 DIAGNOSIS — L089 Local infection of the skin and subcutaneous tissue, unspecified: Secondary | ICD-10-CM | POA: Diagnosis not present

## 2023-02-26 DIAGNOSIS — N186 End stage renal disease: Secondary | ICD-10-CM | POA: Diagnosis not present

## 2023-02-26 DIAGNOSIS — T148XXA Other injury of unspecified body region, initial encounter: Secondary | ICD-10-CM | POA: Diagnosis not present

## 2023-02-26 DIAGNOSIS — E1122 Type 2 diabetes mellitus with diabetic chronic kidney disease: Secondary | ICD-10-CM | POA: Diagnosis not present

## 2023-02-26 LAB — CBC
HCT: 32.3 % — ABNORMAL LOW (ref 39.0–52.0)
Hemoglobin: 10.7 g/dL — ABNORMAL LOW (ref 13.0–17.0)
MCH: 28.4 pg (ref 26.0–34.0)
MCHC: 33.1 g/dL (ref 30.0–36.0)
MCV: 85.7 fL (ref 80.0–100.0)
Platelets: 171 10*3/uL (ref 150–400)
RBC: 3.77 MIL/uL — ABNORMAL LOW (ref 4.22–5.81)
RDW: 15.2 % (ref 11.5–15.5)
WBC: 5.8 10*3/uL (ref 4.0–10.5)
nRBC: 0 % (ref 0.0–0.2)

## 2023-02-26 LAB — RENAL FUNCTION PANEL
Albumin: 2.7 g/dL — ABNORMAL LOW (ref 3.5–5.0)
Anion gap: 14 (ref 5–15)
BUN: 48 mg/dL — ABNORMAL HIGH (ref 8–23)
CO2: 24 mmol/L (ref 22–32)
Calcium: 8 mg/dL — ABNORMAL LOW (ref 8.9–10.3)
Chloride: 97 mmol/L — ABNORMAL LOW (ref 98–111)
Creatinine, Ser: 11 mg/dL — ABNORMAL HIGH (ref 0.61–1.24)
GFR, Estimated: 5 mL/min — ABNORMAL LOW (ref >60)
Glucose, Bld: 114 mg/dL — ABNORMAL HIGH (ref 70–99)
Phosphorus: 5.1 mg/dL — ABNORMAL HIGH (ref 2.5–4.6)
Potassium: 4.5 mmol/L (ref 3.5–5.1)
Sodium: 135 mmol/L (ref 135–145)

## 2023-02-26 MED ORDER — LIDOCAINE-PRILOCAINE 2.5-2.5 % EX CREA: 1.0000 | TOPICAL_CREAM | CUTANEOUS | Status: DC | PRN

## 2023-02-26 MED ORDER — PENTAFLUOROPROP-TETRAFLUOROETH EX AERO: 1.0000 | INHALATION_SPRAY | CUTANEOUS | Status: DC | PRN

## 2023-02-26 MED ORDER — HEPARIN SODIUM (PORCINE) 1000 UNIT/ML DIALYSIS: 1000.0000 [IU] | INTRAMUSCULAR | Status: DC | PRN

## 2023-02-26 MED ORDER — PENTAFLUOROPROP-TETRAFLUOROETH EX AERO
INHALATION_SPRAY | CUTANEOUS | Status: AC
  Filled 2023-02-26: qty 30

## 2023-02-26 NOTE — Progress Notes (Signed)
  Received patient in bed to unit.   Informed consent signed and in chart.    TX duration:3.5hrs     Transported back to floor  Hand-off given to patient's nurse. Pt c/o pain 7/10 L foot.  Primary nurse made aware    Access used: L AVF Access issues: none   Total UF removed: 2.5L Medication(s) given: Norco Post HD weight:  101.9kg     Lynann Beaver  [facility]

## 2023-02-26 NOTE — TOC Progression Note (Signed)
Transition of Care [facility]) - Progression Note    Patient Details  Name: Adam Howell MRN: [id number] Date of Birth: Oct 15, 1960  [facility]) CM/SW Contact  Margarito Liner, LCSW Phone Number: 02/26/2023, 11:36 AM  Clinical Narrative:  CSW called [employer] appeals who stated they did not have an appeal on file. CSW notified patient who confirmed he had called to start it. CSW asked him to call again and see if he got different information.   Expected Discharge Plan and Services                                               Social Determinants of Health (SDOH) Interventions SDOH Screenings   Food Insecurity: No Food Insecurity (02/09/2023)  Housing: Unknown (02/25/2023)  Transportation Needs: No Transportation Needs (02/09/2023)  Recent Concern: Transportation Needs - Unmet Transportation Needs (12/29/2022)  Utilities: Not At Risk (02/09/2023)  Alcohol Screen: Low Risk  (11/25/2021)  Depression (PHQ2-9): Low Risk  (12/29/2022)  Financial Resource Strain: Low Risk  (11/25/2021)  Physical Activity: Insufficiently Active (11/25/2021)  Social Connections: Moderately Isolated (12/29/2022)  Stress: No Stress Concern Present (11/25/2021)  Tobacco Use: Low Risk  (02/09/2023)    Readmission Risk Interventions    02/16/2023    6:21 PM  Readmission Risk Prevention Plan  Transportation Screening Complete  Palliative Care Screening Not Applicable  Medication Review (RN Care Manager) Complete

## 2023-02-26 NOTE — Progress Notes (Signed)
Progress Note   Patient: Adam Howell ZOX:[id number] DOB: 07/11/1960 DOA: 02/09/2023     17 DOS: the patient was seen and examined on 02/26/2023     Brief hospital course: "Xavior Lorah is 62 y.o. male male w/ PMH ESRD on HD MWF, anemia chronic kidney disease, type 2 diabetes, chronic pain syndrome, chronic HFrEF, a/p R AKA, HLD, HTN, CAD.  Presents to the emergency department from home with C/L left leg pain and swelling x 3 to 4 days, foul odor to wound and leaking fluid for past few days.  Has been seeing wound care for this, recently treated outpatient by his PCP with doxycycline but wound has gotten worse.    Patient is seen by vascular team, advised ABI and he had angiogram with percutaneous transluminal angioplasty of left posterior tibial artery and left peroneal artery on Monday. He is on broad spectrum IV antibiotic therapy.  According to infectious disease last dose is 02/24/2023" See H&P for full HPI on admission & ED course.  Further hospital course and management as outlined below.    Assessment and Plan: Ulcer left lower extremity with infection. Patient does have draining, worsening pain. MRI left lower extremity negative for osteomyelitis. Cultures 12/24/22 - Staph, enterococcus, enterobacter. Continue vancomycin and cefepime as recommended by ID last dose 02/24/2023 Vascular surgery team performed angiogram with percutaneous transluminal angioplasty of left posterior tibial artery and left peroneal artery  PT OT has recommended skilled nursing facility Social services working on placement.  Insurance denied authorization for SNF. Pt is appealing that decision.   Acute diarrhea - improving WBC normal no fever no abdominal pain Dr. Meriam Sprague discussed with infectious disease, felt very unlikely C. Difficile Continue as needed Imodium Monitor  ESRD on hemodialysis HD schedule MWF Nephrology following for dialysis   Hyperkalemia Improved after hemodialysis    Chronic atrial fibrillation: Continue amiodarone, Eliquis therapy.   Chronic diastolic CHF No exacerbation. GDMT limited due to low blood pressures.   Chronic pain syndrome: Continue home dose opiates, Lyrica therapy. Discontinued IV Dilaudid  continue norco as needed.   Type 2 diabetes mellitus: A1c 5.9. sugars stable. He wishes regular diet, changed to renal.   Chronic constipation: Due to chronic opiate use. Laxative discontinued due to diarrhea   Obesity with BMI 32.5: Diet, exercise and weight reduction advised.     DVT prophylaxis   Code Status: Full Code     Subjective:  Patient seen resting during dialysis.  Denies acute complaints.       Physical Exam: General exam: awake, alert, no acute distress Respiratory system: on room air, normal respiratory effort. Cardiovascular system: RRR Gastrointestinal system: soft, NT, ND Central nervous system: A&O x3. no gross focal neurologic deficits, normal speech Extremities: R AKA, LUE fistula Skin: dry, intact, normal temperature Psychiatry: normal mood, congruent affect, judgement and insight appear normal      Family Communication: Discussed with patient   Disposition: Status is: Inpatient Remains inpatient appropriate because: awaiting appeal of SNF denial    Planned Discharge Destination: SNF -- auth denied, appeal pending     Time spent: 35 minutes   Data Reviewed:     Latest Ref Rng & Units 02/24/2023    3:32 AM 02/23/2023    5:13 AM 02/22/2023    4:24 AM  BMP  Glucose 70 - 99 mg/dL 77  811  97   BUN 8 - 23 mg/dL 46  36  58   Creatinine 0.61 - 1.24 mg/dL 91.47  8.29  12.03   Sodium 135 - 145 mmol/L 133  131  133   Potassium 3.5 - 5.1 mmol/L 4.6  4.4  4.5   Chloride 98 - 111 mmol/L 97  94  94   CO2 22 - 32 mmol/L 25  27  27    Calcium 8.9 - 10.3 mg/dL 8.2  7.8  7.9     Vitals:   02/25/23 1619 02/25/23 1943 02/26/23 0314 02/26/23 0738  BP: (!) 106/59 122/60 125/66 128/64  Pulse: 64 (!)  59 (!) 52 (!) 57  Resp: 16 16 16 18   Temp: 98.4 F (36.9 C) 98.4 F (36.9 C) 98.3 F (36.8 C) 98 F (36.7 C)  TempSrc: Oral Oral Oral   SpO2: 100% 100% 100% 100%  Weight:      Height:          Latest Ref Rng & Units 02/24/2023    3:32 AM 02/23/2023    5:13 AM 02/22/2023    1:57 PM  CBC  WBC 4.0 - 10.5 K/uL 6.4  6.3  6.0   Hemoglobin 13.0 - 17.0 g/dL 62.1  30.8  65.7   Hematocrit 39.0 - 52.0 % 33.7  33.5  33.5   Platelets 150 - 400 K/uL 147  144  143      Author: Pennie Banter, DO 02/26/2023 11:27 AM  For on call review [email or web address].

## 2023-02-26 NOTE — Plan of Care (Signed)

## 2023-02-26 NOTE — Progress Notes (Signed)
Occupational Therapy Treatment Patient Details Name: Adam Howell MRN: [id number] DOB: Jul 01, 1960 Today's Date: 02/26/2023   History of present illness 62 y.o. male presented to the ED from home with left leg pain and swelling and a wound with foul discharge. He has had a wound for many months now. S/p angiogram with percutaneous transluminal angioplasty of L posterior tibial artery and left peroneal artery on Monday.PMHx: ESRD on dialysis, Anemia, a-fib, CHF, chronic pain, DM, Chronic pain syndrome, Rt AKA, HTN CAD.   OT comments  Pt received semi-reclined in bed. Appearing alert; willing to work with OT and PT on dressing, transfers, and stairs. Dressed bed level MIN A LB clothing; See flowsheet below for further details of session. See PT note for details of stairs. Left semi-reclined in bed with all needs in reach.  Patient will benefit from continued OT while in acute care.      If plan is discharge home, recommend the following:  A little help with walking and/or transfers;A little help with bathing/dressing/bathroom;Assistance with cooking/housework;Direct supervision/assist for medications management;Direct supervision/assist for financial management;Assist for transportation;Help with stairs or ramp for entrance   Equipment Recommendations  Other (comment) (drop arm BSC)    Recommendations for Other Services      Precautions / Restrictions Precautions Precautions: Fall Restrictions Weight Bearing Restrictions Per Provider Order: No       Mobility Bed Mobility Overal bed mobility: Needs Assistance Bed Mobility: Supine to Sit, Sit to Supine     Supine to sit: Min assist Sit to supine: Supervision   General bed mobility comments: with bed rails    Transfers Overall transfer level: Needs assistance Equipment used: None Transfers: Bed to chair/wheelchair/BSC Sit to Stand: Contact guard assist Stand pivot transfers: Contact guard assist, +2 safety/equipment         Lateral/Scoot Transfers: Contact guard assist General transfer comment: OT holding w/c to ensure safety; pt did lock w/c brakes.     Balance Overall balance assessment: Needs assistance Sitting-balance support: Single extremity supported, Feet supported Sitting balance-Leahy Scale: Good     Standing balance support: Bilateral upper extremity supported, Reliant on assistive device for balance, During functional activity Standing balance-Leahy Scale: Fair                             ADL either performed or assessed with clinical judgement   ADL Overall ADL's : Needs assistance/impaired                     Lower Body Dressing: Bed level;Minimal assistance Lower Body Dressing Details (indicate cue type and reason): Pt states that he typically stands to don underwear/pants and leans against a wall; no such wall available near bed, so pt able to bridge in bed for donning LB clothing today. Pt needing assist from OT for sock donning (states he has assist at home) on L foot; pt able to don shoe.   Toilet Transfer Details (indicate cue type and reason): recommend drop arm BSC at home for scoot transfer           General ADL Comments: anticipate need for seated/supine LB ADLs.    Extremity/Trunk Assessment              Vision       Perception     Praxis      Cognition Arousal: Alert Behavior During Therapy: WFL for tasks assessed/performed Overall Cognitive Status: Within Functional Limits for  tasks assessed                                 General Comments: Pt demonstrating signs of mild impairment; for example, once clarified with OT/PT that we are going to work on stairs, pt asked about putting his prosthetic on, but then clarified he doesn't use prosthetic on the stairs at home. Also, once on top step of one stairwell, informed by PT to pretend he is at the top of his stairwell and PT asked where he wanted his w/c positioned; pt  stating that he needed to get up another flight of stairs to show PT; having difficulty generalizing that the top of this stairway was the same set up as the next stairway and that we were not going further. Would recommend niece/nephew be present for [facility] visits and all stairs transfers for safety.        Exercises      Shoulder Instructions       General Comments Pt on room air. LLE wrapping in place. Pt participating in stair training with x3 for safety; good use of UE on BIL handrails in stairwell. See PT note for details of stairs.    Pertinent Vitals/ Pain       Pain Assessment Pain Assessment: No/denies pain  Home Living                                          Prior Functioning/Environment              Frequency  Min 1X/week        Progress Toward Goals  OT Goals(current goals can now be found in the care plan section)  Progress towards OT goals: Progressing toward goals  Acute Rehab OT Goals Patient Stated Goal: Get better OT Goal Formulation: With patient Time For Goal Achievement: 03/04/23 Potential to Achieve Goals: Good ADL Goals Pt Will Perform Lower Body Bathing: with supervision;sitting/lateral leans;sit to/from stand Pt Will Perform Lower Body Dressing: with supervision;sit to/from stand;sitting/lateral leans Pt Will Transfer to Toilet: squat pivot transfer;regular height toilet;grab bars Pt Will Perform Toileting - Clothing Manipulation and hygiene: with supervision;sit to/from stand;sitting/lateral leans  Plan      Co-evaluation    PT/OT/SLP Co-Evaluation/Treatment: Yes Reason for Co-Treatment: For patient/therapist safety;To address functional/ADL transfers PT goals addressed during session: Mobility/safety with mobility OT goals addressed during session: ADL's and self-care;Proper use of Adaptive equipment and DME;Strengthening/ROM      AM-PAC OT "6 Clicks" Daily Activity     Outcome Measure   Help from another person  eating meals?: None Help from another person taking care of personal grooming?: None Help from another person toileting, which includes using toliet, bedpan, or urinal?: A Little Help from another person bathing (including washing, rinsing, drying)?: A Little Help from another person to put on and taking off regular upper body clothing?: None Help from another person to put on and taking off regular lower body clothing?: A Little 6 Click Score: 21    End of Session Equipment Utilized During Treatment: Gait belt;Rolling walker (2 wheels)  OT Visit Diagnosis: Other abnormalities of gait and mobility (R26.89);Unsteadiness on feet (R26.81);Muscle weakness (generalized) (M62.81)   Activity Tolerance Patient tolerated treatment well   Patient Left in bed;with call bell/phone within reach;with bed alarm set   Nurse  Communication Mobility status        Time: 1610-9604 OT Time Calculation (min): 38 min  Charges: OT General Charges $OT Visit: 1 Visit OT Treatments $Self Care/Home Management : 8-22 mins  Linward Foster, MS, OTR/L  Alvester Morin 02/26/2023, 11:52 AM

## 2023-02-26 NOTE — Progress Notes (Signed)
Physical Therapy Treatment Patient Details Name: Adam Howell MRN: [id number] DOB: [birth year] Today's Date: 02/26/2023   History of Present Illness 62 y.o. male presented to the ED from home with left leg pain and swelling and a wound with foul discharge. He has had a wound for many months now. S/p angiogram with percutaneous transluminal angioplasty of L posterior tibial artery and left peroneal artery on Monday.PMHx: ESRD on dialysis, Anemia, a-fib, CHF, chronic pain, DM, Chronic pain syndrome, Rt AKA, HTN CAD.    PT Comments  Pt agreeable to PT/OT co-treat to maximize function and safety. Pt able to don underwear and pj pants with set up, supine in bed via bridging. minA to come up into sitting EOB at pt request, not necessarily required. CGA to stand pivot to WC, and able to propel WC and manage parts modI. Stair training performed to assess pts ability to navigate home. He was able to scoot up and down the stairs on his bottom modI with bilateral rails. To transition from floor to [facility] at top of stairs, reliance on BUE support and set up for [facility], but able to do with CGAx2. Hopping down stairs also performed, CGAx2-3 for safety. PT/pt reviewed pt goals and current function, education and encouragement provided as able. Pt vocalized really wanting to improve his walking with his prosthetic as his long term goal. The patient would benefit from further skilled PT intervention to continue to maximize function, safety, and independence.    If plan is discharge home, recommend the following: A little help with walking and/or transfers;Help with stairs or ramp for entrance;Assist for transportation;A little help with bathing/dressing/bathroom;Assistance with cooking/housework   Can travel by private vehicle     Yes  Equipment Recommendations  None recommended by PT    Recommendations for Other Services       Precautions / Restrictions Precautions Precautions: Fall Restrictions Weight  Bearing Restrictions Per Provider Order: No     Mobility  Bed Mobility               General bed mobility comments: light minA at request with OT    Transfers Overall transfer level: Needs assistance Equipment used: None Transfers: Bed to chair/wheelchair/BSC, Sit to/from Stand Sit to Stand: Contact guard assist Stand pivot transfers: Contact guard assist, +2 safety/equipment              Ambulation/Gait                   Stairs Stairs: Yes Stairs assistance: Contact guard assist, +2 safety/equipment, Min assist, +2 physical assistance Stair Management: Two rails   General stair comments: see clinical impression for details   Wheelchair Mobility Wheelchair Mobility Wheelchair mobility: Yes Wheelchair propulsion: Both upper extremities Wheelchair parts: Independent   Tilt Bed    Modified Rankin (Stroke Patients Only)       Balance Overall balance assessment: Needs assistance Sitting-balance support: Single extremity supported, Feet supported Sitting balance-Leahy Scale: Good     Standing balance support: Bilateral upper extremity supported, Reliant on assistive device for balance, During functional activity Standing balance-Leahy Scale: Fair                              Cognition Arousal: Alert Behavior During Therapy: WFL for tasks assessed/performed Overall Cognitive Status: Within Functional Limits for tasks assessed  General Comments: may have some deficits in processing and critical thinking        Exercises      General Comments        Pertinent Vitals/Pain Pain Assessment Pain Assessment: No/denies pain    Home Living                          Prior Function            PT Goals (current goals can now be found in the care plan section) Progress towards PT goals: Progressing toward goals    Frequency    Min 1X/week      PT Plan       Co-evaluation PT/OT/SLP Co-Evaluation/Treatment: Yes Reason for Co-Treatment: For patient/therapist safety;To address functional/ADL transfers PT goals addressed during session: Mobility/safety with mobility OT goals addressed during session: ADL's and self-care;Proper use of Adaptive equipment and DME;Strengthening/ROM      AM-PAC PT "6 Clicks" Mobility   Outcome Measure  Help needed turning from your back to your side while in a flat bed without using bedrails?: None Help needed moving from lying on your back to sitting on the side of a flat bed without using bedrails?: None Help needed moving to and from a bed to a chair (including a wheelchair)?: A Little Help needed standing up from a chair using your arms (e.g., wheelchair or bedside chair)?: A Little Help needed to walk in hospital room?: A Little Help needed climbing 3-5 steps with a railing? : A Little 6 Click Score: 20    End of Session Equipment Utilized During Treatment: Gait belt Activity Tolerance: Patient tolerated treatment well Patient left: in bed;with call bell/phone within reach Nurse Communication: Mobility status PT Visit Diagnosis: Other abnormalities of gait and mobility (R26.89);Muscle weakness (generalized) (M62.81);Difficulty in walking, not elsewhere classified (R26.2);Pain Pain - Right/Left: Left Pain - part of body: Leg     Time: 0917-0949 PT Time Calculation (min) (ACUTE ONLY): 32 min  Charges:    $Gait Training: 8-22 mins PT General Charges $$ ACUTE PT VISIT: 1 Visit                     Olga Coaster PT, DPT 11:36 AM,02/26/23

## 2023-02-26 NOTE — Progress Notes (Signed)
[facility]  ROUNDING NOTE   Subjective:   Adam Howell is a 62 y.o male with past medical history of diabetes, hypertension, CAD, Rt AKA, anemia, CHF, and ESRD on dialysis. Patient presents to ED with left leg pain and swelling. He has been admitted for Wound infection [T14.8XXA, L08.9] Left leg cellulitis [L03.116] Infected ulcer of skin (HCC) [L98.499, L08.9]  Patient known to our practice and receives outpatient dialysis treatments at [facility] on a MWF schedule, supervised by [facility] physicians.   Update: Patient seen laying in bed Alert and oriented Denies pain Diarrhea continues to slow   Objective:  Vital signs in last 24 hours:  Temp:  [98 F (36.7 C)-98.4 F (36.9 C)] 98.1 F (36.7 C) (12/13 1230) Pulse Rate:  [52-64] 60 (12/13 1330) Resp:  [13-24] 13 (12/13 1330) BP: (106-142)/(59-77) 142/77 (12/13 1330) SpO2:  [97 %-100 %] 99 % (12/13 1330) Weight:  [104.4 kg] 104.4 kg (12/13 1300)  Weight change:  Filed Weights   02/24/23 0800 02/24/23 1230 02/26/23 1300  Weight: 103 kg 103.2 kg 104.4 kg    Intake/Output: I/O last 3 completed shifts: In: 720 [P.O.:720] Out: -    Intake/Output this shift:  Total I/O In: 240 [P.O.:240] Out: -   Physical Exam: General: NAD  Head: Normocephalic, atraumatic. Moist oral mucosal membranes  Eyes: Anicteric  Lungs:  Clear to auscultation, normal effort  Heart: Regular rate and rhythm  Abdomen:  Soft, nontender  Extremities: No peripheral edema.  Neurologic: Alert and oriented, moving all four extremities  Skin: LLE wound, ace bandage  Access: Lt AVF    Basic Metabolic Panel: Recent Labs  Lab 02/21/23 0432 02/22/23 0424 02/23/23 0513 02/24/23 0332 02/26/23 1344  NA 132* 133* 131* 133* 135  K 4.2 4.5 4.4 4.6 4.5  CL 93* 94* 94* 97* 97*  CO2 26 27 27 25 24   GLUCOSE 90 97 111* 77 114*  BUN 48* 58* 36* 46* 48*  CREATININE 10.83* 12.03* 9.01* 10.81* 11.00*  CALCIUM 8.2* 7.9* 7.8* 8.2* 8.0*   PHOS  --   --   --   --  5.1*    Liver Function Tests: Recent Labs  Lab 02/26/23 1344  ALBUMIN 2.7*    No results for input(s): "LIPASE", "AMYLASE" in the last 168 hours. No results for input(s): "AMMONIA" in the last 168 hours.  CBC: Recent Labs  Lab 02/20/23 0457 02/21/23 0432 02/22/23 0424 02/22/23 1357 02/23/23 0513 02/24/23 0332 02/26/23 1344  WBC 6.7 6.0 7.4 6.0 6.3 6.4 5.8  NEUTROABS 4.3 3.5 4.6  --  3.9 4.0  --   HGB 13.3 12.4* 12.1* 11.1* 11.1* 11.4* 10.7*  HCT 40.2 37.9* 36.4* 33.5* 33.5* 33.7* 32.3*  MCV 84.8 85.7 85.8 85.0 85.2 84.3 85.7  PLT 197 182 163 143* 144* 147* 171    Cardiac Enzymes: No results for input(s): "CKTOTAL", "CKMB", "CKMBINDEX", "TROPONINI" in the last 168 hours.  BNP: Invalid input(s): "POCBNP"  CBG: No results for input(s): "GLUCAP" in the last 168 hours.   Microbiology: Results for orders placed or performed during the hospital encounter of 02/09/23  Culture, blood (routine x 2)     Status: None   Collection Time: 02/09/23 10:30 AM   Specimen: BLOOD  Result Value Ref Range Status   Specimen Description BLOOD RIGHT [facility]  Final   Special Requests BOTTLES DRAWN AEROBIC AND ANAEROBIC  Final   Culture   Final    NO GROWTH 5 DAYS Performed at [facility]  Lab, [address] [place], [place] [address]    Report Status 02/14/2023 FINAL  Final  Culture, blood (routine x 2)     Status: None   Collection Time: 02/09/23  9:46 PM   Specimen: BLOOD  Result Value Ref Range Status   Specimen Description BLOOD RIGHT HAND  Final   Special Requests   Final    BOTTLES DRAWN AEROBIC AND ANAEROBIC Blood Culture adequate volume   Culture   Final    NO GROWTH 5 DAYS Performed at [facility], [address], [place], [place] [address]    Report Status 02/14/2023 FINAL  Final  Aerobic Culture w Gram Stain (superficial specimen)     Status: None   Collection Time: 02/16/23  4:34 PM   Specimen: Leg; Wound  Result Value Ref  Range Status   Specimen Description LEG  Final   Special Requests LEFT LEG  Final   Gram Stain NO WBC SEEN RARE GRAM POSITIVE COCCI IN PAIRS   Final   Culture   Final    RARE ENTEROCOCCUS FAECALIS RARE CORYNEBACTERIUM STRIATUM Standardized susceptibility testing for this organism is not available.    Report Status 02/20/2023 FINAL  Final   Organism ID, Bacteria ENTEROCOCCUS FAECALIS  Final      Susceptibility   Enterococcus faecalis - MIC*    AMPICILLIN <=2 SENSITIVE Sensitive     VANCOMYCIN 1 SENSITIVE Sensitive     GENTAMICIN SYNERGY RESISTANT Resistant     * RARE ENTEROCOCCUS FAECALIS    Coagulation Studies: No results for input(s): "LABPROT", "INR" in the last 72 hours.  Urinalysis: No results for input(s): "COLORURINE", "LABSPEC", "PHURINE", "GLUCOSEU", "HGBUR", "BILIRUBINUR", "KETONESUR", "PROTEINUR", "UROBILINOGEN", "NITRITE", "LEUKOCYTESUR" in the last 72 hours.  Invalid input(s): "APPERANCEUR"    Imaging: No results found.   Medications:      amiodarone  200 mg Oral BID   apixaban  2.5 mg Oral BID   aspirin EC  81 mg Oral Daily   calcitRIOL  1.5 mcg Oral Q M,W,F-1800   cinacalcet  30 mg Oral Q breakfast   leptospermum manuka honey  1 Application Topical Daily   melatonin  2.5 mg Oral QHS   multivitamin  1 tablet Oral QHS   pregabalin  25 mg Oral Daily   rosuvastatin  40 mg Oral Daily   sevelamer carbonate  2,400 mg Oral TID WC   acetaminophen **OR** acetaminophen, heparin, heparin, heparin, hydrALAZINE, HYDROcodone-acetaminophen, lidocaine-prilocaine, lidocaine-prilocaine, lidocaine-prilocaine, loperamide, LORazepam, ondansetron **OR** ondansetron (ZOFRAN) IV, pentafluoroprop-tetrafluoroeth, pentafluoroprop-tetrafluoroeth, pentafluoroprop-tetrafluoroeth  Assessment/ Plan:  [name] is a 62 y.o.  male with past medical history of diabetes, hypertension, CAD, Rt AKA, anemia, CHF, and ESRD on dialysis. Patient presents to ED with left leg pain  and swelling. He has been admitted for Wound infection [T14.8XXA, L08.9] Left leg cellulitis [L03.116] Infected ulcer of skin (HCC) [L98.499, L08.9]  [facility]/MWF/Lt AVF  End stage renal disease on hemodialysis. Scheduled to receive dialysis later today. UF goal 2.5L as tolerated. Next treatment scheduled for Monday.   2. Anemia of chronic kidney disease Lab Results  Component Value Date   HGB 10.7 (L) 02/26/2023    Hemoglobin within desired range.   3. Wound infection, Left leg. Imaging negative for osteomyelitis. We feel wound is more likely arterial insuffiencey and not due to calciphylaxis.  Receiving cefepime and vancomycin.  Vascular ABI study normal. Vascular surgery performed angioplasty with intervention on 02/15/2023.  Cefepime 2g and Vancomycin 1g completed  4. Secondary  Hyperparathyroidism: with outpatient labs: PTH 558, phosphorus 7.0, calcium 8.2 on 01/20/23.   Lab Results  Component Value Date   CALCIUM 8.0 (L) 02/26/2023   PHOS 5.1 (H) 02/26/2023    Prescribed calcitriol, cinacalcet and sevelamer outpatient. Bone minerals acceptable.    LOS: 17 Narda Fundora 12/13/20242:40 PM

## 2023-02-27 DIAGNOSIS — I482 Chronic atrial fibrillation, unspecified: Secondary | ICD-10-CM | POA: Diagnosis not present

## 2023-02-27 DIAGNOSIS — E1122 Type 2 diabetes mellitus with diabetic chronic kidney disease: Secondary | ICD-10-CM | POA: Diagnosis not present

## 2023-02-27 DIAGNOSIS — N186 End stage renal disease: Secondary | ICD-10-CM | POA: Diagnosis not present

## 2023-02-27 DIAGNOSIS — T148XXA Other injury of unspecified body region, initial encounter: Secondary | ICD-10-CM | POA: Diagnosis not present

## 2023-02-27 NOTE — Progress Notes (Signed)
[facility]  ROUNDING NOTE   Subjective:   Adam Howell is a 62 y.o male with past medical history of diabetes, hypertension, CAD, Rt AKA, anemia, CHF, and ESRD on dialysis. Patient presents to ED with left leg pain and swelling. He has been admitted for Wound infection [T14.8XXA, L08.9] Left leg cellulitis [L03.116] Infected ulcer of skin (HCC) [L98.499, L08.9]   Patient known to our practice and receives outpatient dialysis treatments at [facility] on a MWF schedule, supervised by [facility] physicians.   Update: Patient seen in bed alert and oriented, eating breakfast. Denies shortness of breath, nausea, vomiting. Patient awaiting placement for rehab.     Objective:  Vital signs in last 24 hours:  Temp:  [98.1 F (36.7 C)-98.6 F (37 C)] 98.4 F (36.9 C) (12/14 0946) Pulse Rate:  [57-71] 66 (12/14 0946) Resp:  [13-24] 18 (12/14 0946) BP: (108-142)/(63-90) 114/65 (12/14 0946) SpO2:  [96 %-100 %] 96 % (12/14 0946) Weight:  [101.9 kg-104.4 kg] 101.9 kg (12/13 1703)  Weight change:  Filed Weights   02/24/23 1230 02/26/23 1300 02/26/23 1703  Weight: 103.2 kg 104.4 kg 101.9 kg    Intake/Output: I/O last 3 completed shifts: In: 480 [P.O.:480] Out: 2500 [Other:2500]   Intake/Output this shift:  No intake/output data recorded.  Physical Exam: General: NAD,   Head: Normocephalic, atraumatic. Moist oral mucosal membranes  Eyes: Anicteric, PERRL  Neck: Supple, trachea midline  Lungs:  Clear to auscultation  Heart: Regular rate and rhythm  Abdomen:  Soft, nontender,   Extremities:  No peripheral edema.  Neurologic: Nonfocal, moving all four extremities  Skin: LLE wound, ace bandage  Access: Lt AVF    Basic Metabolic Panel: Recent Labs  Lab 02/21/23 0432 02/22/23 0424 02/23/23 0513 02/24/23 0332 02/26/23 1344  NA 132* 133* 131* 133* 135  K 4.2 4.5 4.4 4.6 4.5  CL 93* 94* 94* 97* 97*  CO2 26 27 27 25 24   GLUCOSE 90 97 111* 77 114*  BUN 48*  58* 36* 46* 48*  CREATININE 10.83* 12.03* 9.01* 10.81* 11.00*  CALCIUM 8.2* 7.9* 7.8* 8.2* 8.0*  PHOS  --   --   --   --  5.1*    Liver Function Tests: Recent Labs  Lab 02/26/23 1344  ALBUMIN 2.7*   No results for input(s): "LIPASE", "AMYLASE" in the last 168 hours. No results for input(s): "AMMONIA" in the last 168 hours.  CBC: Recent Labs  Lab 02/21/23 0432 02/22/23 0424 02/22/23 1357 02/23/23 0513 02/24/23 0332 02/26/23 1344  WBC 6.0 7.4 6.0 6.3 6.4 5.8  NEUTROABS 3.5 4.6  --  3.9 4.0  --   HGB 12.4* 12.1* 11.1* 11.1* 11.4* 10.7*  HCT 37.9* 36.4* 33.5* 33.5* 33.7* 32.3*  MCV 85.7 85.8 85.0 85.2 84.3 85.7  PLT 182 163 143* 144* 147* 171    Cardiac Enzymes: No results for input(s): "CKTOTAL", "CKMB", "CKMBINDEX", "TROPONINI" in the last 168 hours.  BNP: Invalid input(s): "POCBNP"  CBG: No results for input(s): "GLUCAP" in the last 168 hours.  Microbiology: Results for orders placed or performed during the hospital encounter of 02/09/23  Culture, blood (routine x 2)     Status: None   Collection Time: 02/09/23 10:30 AM   Specimen: BLOOD  Result Value Ref Range Status   Specimen Description BLOOD RIGHT [facility]  Final   Special Requests BOTTLES DRAWN AEROBIC AND ANAEROBIC  Final   Culture   Final    NO GROWTH 5 DAYS Performed at [facility], [address], [place], [place] [address]    Report Status 02/14/2023 FINAL  Final  Culture, blood (routine x 2)     Status: None   Collection Time: 02/09/23  9:46 PM   Specimen: BLOOD  Result Value Ref Range Status   Specimen Description BLOOD RIGHT HAND  Final   Special Requests   Final    BOTTLES DRAWN AEROBIC AND ANAEROBIC Blood Culture adequate volume   Culture   Final    NO GROWTH 5 DAYS Performed at [facility], [address], [place], [place] [address]    Report Status 02/14/2023 FINAL  Final  Aerobic Culture w Gram Stain (superficial specimen)     Status: None   Collection Time: 02/16/23   4:34 PM   Specimen: Leg; Wound  Result Value Ref Range Status   Specimen Description LEG  Final   Special Requests LEFT LEG  Final   Gram Stain NO WBC SEEN RARE GRAM POSITIVE COCCI IN PAIRS   Final   Culture   Final    RARE ENTEROCOCCUS FAECALIS RARE CORYNEBACTERIUM STRIATUM Standardized susceptibility testing for this organism is not available.    Report Status 02/20/2023 FINAL  Final   Organism ID, Bacteria ENTEROCOCCUS FAECALIS  Final      Susceptibility   Enterococcus faecalis - MIC*    AMPICILLIN <=2 SENSITIVE Sensitive     VANCOMYCIN 1 SENSITIVE Sensitive     GENTAMICIN SYNERGY RESISTANT Resistant     * RARE ENTEROCOCCUS FAECALIS    Coagulation Studies: No results for input(s): "LABPROT", "INR" in the last 72 hours.  Urinalysis: No results for input(s): "COLORURINE", "LABSPEC", "PHURINE", "GLUCOSEU", "HGBUR", "BILIRUBINUR", "KETONESUR", "PROTEINUR", "UROBILINOGEN", "NITRITE", "LEUKOCYTESUR" in the last 72 hours.  Invalid input(s): "APPERANCEUR"    Imaging: No results found.   Medications:     amiodarone  200 mg Oral BID   apixaban  2.5 mg Oral BID   aspirin EC  81 mg Oral Daily   calcitRIOL  1.5 mcg Oral Q M,W,F-1800   cinacalcet  30 mg Oral Q breakfast   leptospermum manuka honey  1 Application Topical Daily   melatonin  2.5 mg Oral QHS   multivitamin  1 tablet Oral QHS   pregabalin  25 mg Oral Daily   rosuvastatin  40 mg Oral Daily   sevelamer carbonate  2,400 mg Oral TID WC   acetaminophen **OR** acetaminophen, hydrALAZINE, HYDROcodone-acetaminophen, loperamide, LORazepam, ondansetron **OR** ondansetron (ZOFRAN) IV  Assessment/ Plan:  [name] is a 62 y.o.  male  with past medical history of diabetes, hypertension, CAD, Rt AKA, anemia, CHF, and ESRD on dialysis. Patient presents to ED with left leg pain and swelling. He has been admitted for Wound infection [T14.8XXA, L08.9] Left leg cellulitis [L03.116] Infected ulcer of skin (HCC)  [L98.499, L08.9]   [facility]/MWF/Lt AVF   End stage renal disease on hemodialysis.Dialysis 12/13, 2.5L removed. Next treatment scheduled for Monday.    2. Anemia of chronic kidney disease Recent Labs       Lab Results  Component Value Date    HGB 10.7 (L) 02/26/2023      Hemoglobin within desired range.    3. Wound infection, Left leg. Imaging negative for osteomyelitis. We feel wound is more likely arterial insuffiencey and not due to calciphylaxis.  Receiving cefepime and vancomycin.  Vascular ABI study normal. Vascular surgery performed angioplasty with intervention on 02/15/2023.  Cefepime 2g and Vancomycin 1g completed   4. Secondary  Hyperparathyroidism: with outpatient labs: PTH 558, phosphorus 7.0, calcium 8.2 on 01/20/23.    Recent Labs       Lab Results  Component Value Date    CALCIUM 8.0 (L) 02/26/2023    PHOS 5.1 (H) 02/26/2023      Prescribed calcitriol, cinacalcet and sevelamer outpatient. Bone minerals acceptable.    LOS: 18 Tonnie Friedel P Dashia Caldeira 12/14/202412:09 PM

## 2023-02-27 NOTE — Progress Notes (Signed)
Progress Note   Patient: Adam Howell LKG:[id number] DOB: [birth year] DOA: 02/09/2023     18 DOS: the patient was seen and examined on 02/27/2023     Brief hospital course: "Adam Howell is 62 y.o. male male w/ PMH ESRD on HD MWF, anemia chronic kidney disease, type 2 diabetes, chronic pain syndrome, chronic HFrEF, a/p R AKA, HLD, HTN, CAD.  Presents to the emergency department from home with C/L left leg pain and swelling x 3 to 4 days, foul odor to wound and leaking fluid for past few days.  Has been seeing wound care for this, recently treated outpatient by his PCP with doxycycline but wound has gotten worse.    Patient is seen by vascular team, advised ABI and he had angiogram with percutaneous transluminal angioplasty of left posterior tibial artery and left peroneal artery on Monday. He is on broad spectrum IV antibiotic therapy.  According to infectious disease last dose is 02/24/2023" See H&P for full HPI on admission & ED course.  Further hospital course and management as outlined below.    Assessment and Plan: Ulcer left lower extremity with infection. Patient does have draining, worsening pain. MRI left lower extremity negative for osteomyelitis. Cultures 12/24/22 - Staph, enterococcus, enterobacter. Continue vancomycin and cefepime as recommended by ID last dose 02/24/2023 Vascular surgery team performed angiogram with percutaneous transluminal angioplasty of left posterior tibial artery and left peroneal artery  PT OT has recommended skilled nursing facility Social services working on placement.  Insurance denied authorization for SNF. Pt is appealing that decision.   Acute diarrhea - improving WBC normal no fever no abdominal pain Dr. Meriam Sprague discussed with infectious disease, felt very unlikely C. Difficile Continue as needed Imodium Monitor  ESRD on hemodialysis HD schedule MWF Nephrology following for dialysis   Hyperkalemia Improved after hemodialysis    Chronic atrial fibrillation: Continue amiodarone, Eliquis    Chronic diastolic CHF No exacerbation. GDMT limited due to low blood pressures.   Chronic pain syndrome: Continue home dose opiates, Lyrica therapy. Discontinued IV Dilaudid  continue norco as needed.   Type 2 diabetes mellitus: A1c 5.9. sugars stable. He wishes regular diet, changed to renal.   Chronic constipation: Due to chronic opiate use. Laxative discontinued due to diarrhea   Obesity with BMI 32.5: Diet, exercise and weight reduction advised.     DVT prophylaxis   Code Status: Full Code     Subjective:  Patient seen awake resting in bed this AM.  Diarrhea continues to improve.  Requests to speak to Child psychotherapist.  No other acute complaints.       Physical Exam: General exam: awake, alert, no acute distress Respiratory system: on room air, normal respiratory effort. CTAB no wheezes or rhonchi Cardiovascular system: RRR, normal S1-S2 Gastrointestinal system: soft, NT, ND Central nervous system: A&O x3. no gross focal neurologic deficits, normal speech Extremities: R AKA, LUE fistula Skin: dry, intact, normal temperature Psychiatry: normal mood, congruent affect, judgement and insight appear normal      Family Communication: Discussed with patient   Disposition: Status is: Inpatient Remains inpatient appropriate because: awaiting appeal of SNF denial    Planned Discharge Destination: SNF -- auth denied, appeal pending     Time spent: 35 minutes   Data Reviewed:     Latest Ref Rng & Units 02/26/2023    1:44 PM 02/24/2023    3:32 AM 02/23/2023    5:13 AM  BMP  Glucose 70 - 99 mg/dL 664  77  111   BUN 8 - 23 mg/dL 48  46  36   Creatinine 0.61 - 1.24 mg/dL 16.10  96.04  5.40   Sodium 135 - 145 mmol/L 135  133  131   Potassium 3.5 - 5.1 mmol/L 4.5  4.6  4.4   Chloride 98 - 111 mmol/L 97  97  94   CO2 22 - 32 mmol/L 24  25  27    Calcium 8.9 - 10.3 mg/dL 8.0  8.2  7.8     Vitals:    02/26/23 1703 02/26/23 1938 02/27/23 0321 02/27/23 0946  BP:  108/63 (!) 123/90 114/65  Pulse:  71 60 66  Resp:  16 16 18   Temp:  98.6 F (37 C) 98.2 F (36.8 C) 98.4 F (36.9 C)  TempSrc:  Oral Oral Oral  SpO2:  100% 100% 96%  Weight: 101.9 kg     Height:          Latest Ref Rng & Units 02/26/2023    1:44 PM 02/24/2023    3:32 AM 02/23/2023    5:13 AM  CBC  WBC 4.0 - 10.5 K/uL 5.8  6.4  6.3   Hemoglobin 13.0 - 17.0 g/dL 98.1  19.1  47.8   Hematocrit 39.0 - 52.0 % 32.3  33.7  33.5   Platelets 150 - 400 K/uL 171  147  144      Author: Pennie Banter, DO 02/27/2023 12:04 PM  For on call review [email or web address].

## 2023-02-27 NOTE — TOC Progression Note (Signed)
Transition of Care [facility]) - Progression Note    Patient Details  Name: Adam Howell MRN: [id number] Date of Birth: 1960/12/09  Transition of Care [facility]) CM/SW Contact  Liliana Cline, LCSW Phone Number: 02/27/2023, 10:47 AM  Clinical Narrative:    CSW spoke to patient. Patient confirms he called yesterday to start the appeal with [employer]. Patient asked CSW to call [facility] as well.  CSW called [employer] at [phone] and spoke with [employer]. She was unable to assist. CSW called [employer] back at [phone] and spoke with Representative Lashauna. She confirmed that the member called in yesterday. She states they would not be able to see any details about the appeal until there is an outcome which she states would not be over the weekend.        Expected Discharge Plan and Services                                               Social Determinants of Health (SDOH) Interventions SDOH Screenings   Food Insecurity: No Food Insecurity (02/09/2023)  Housing: Unknown (02/25/2023)  Transportation Needs: No Transportation Needs (02/09/2023)  Recent Concern: Transportation Needs - Unmet Transportation Needs (12/29/2022)  Utilities: Not At Risk (02/09/2023)  Alcohol Screen: Low Risk  (11/25/2021)  Depression (PHQ2-9): Low Risk  (12/29/2022)  Financial Resource Strain: Low Risk  (11/25/2021)  Physical Activity: Insufficiently Active (11/25/2021)  Social Connections: Moderately Isolated (12/29/2022)  Stress: No Stress Concern Present (11/25/2021)  Tobacco Use: Low Risk  (02/09/2023)    Readmission Risk Interventions    02/16/2023    6:21 PM  Readmission Risk Prevention Plan  Transportation Screening Complete  Palliative Care Screening Not Applicable  Medication Review (RN Care Manager) Complete

## 2023-02-28 DIAGNOSIS — T148XXA Other injury of unspecified body region, initial encounter: Secondary | ICD-10-CM | POA: Diagnosis not present

## 2023-02-28 DIAGNOSIS — E1122 Type 2 diabetes mellitus with diabetic chronic kidney disease: Secondary | ICD-10-CM | POA: Diagnosis not present

## 2023-02-28 DIAGNOSIS — I482 Chronic atrial fibrillation, unspecified: Secondary | ICD-10-CM | POA: Diagnosis not present

## 2023-02-28 DIAGNOSIS — N186 End stage renal disease: Secondary | ICD-10-CM | POA: Diagnosis not present

## 2023-02-28 NOTE — Progress Notes (Addendum)
Progress Note   Patient: Adam Howell WUX:[id number] DOB: 22-Aug-1960 DOA: 02/09/2023     19 DOS: the patient was seen and examined on 02/28/2023     Brief hospital course: "Adam Howell is 62 y.o. male male w/ PMH ESRD on HD MWF, anemia chronic kidney disease, type 2 diabetes, chronic pain syndrome, chronic HFrEF, a/p R AKA, HLD, HTN, CAD.  Presents to the emergency department from home with C/L left leg pain and swelling x 3 to 4 days, foul odor to wound and leaking fluid for past few days.  Has been seeing wound care for this, recently treated outpatient by his PCP with doxycycline but wound has gotten worse.    Patient is seen by vascular team, advised ABI and he had angiogram with percutaneous transluminal angioplasty of left posterior tibial artery and left peroneal artery on Monday. He is on broad spectrum IV antibiotic therapy.  According to infectious disease last dose is 02/24/2023" See H&P for full HPI on admission & ED course.  Further hospital course and management as outlined below.   Medically stable for discharge to SNF. Pt is appealing insurance denial for SNF.    Assessment and Plan: Ulcer left lower extremity with infection. Patient does have draining, worsening pain. MRI left lower extremity negative for osteomyelitis. Cultures 12/24/22 - Staph, enterococcus, enterobacter. Completed vancomycin and cefepime as recommended by ID last dose 02/24/2023 Vascular surgery team performed angiogram with percutaneous transluminal angioplasty of left posterior tibial artery and left peroneal artery  PT OT recommended SNF.   TOC following for placement.  Insurance denied authorization for SNF, appeal underway   Acute diarrhea - improving WBC normal no fever no abdominal pain Dr. Meriam Sprague discussed with infectious disease, felt very unlikely C. Difficile Continue as needed Imodium Monitor  ESRD on hemodialysis HD schedule MWF Nephrology following for dialysis    Hyperkalemia Improved after hemodialysis   Chronic atrial fibrillation: Continue amiodarone, Eliquis    Chronic diastolic CHF No exacerbation. GDMT limited due to low blood pressures.   Chronic pain syndrome: Continue home dose opiates, Lyrica therapy. Discontinued IV Dilaudid  continue norco as needed.   Type 2 diabetes mellitus: A1c 5.9. sugars stable. He wishes regular diet, changed to renal.   Chronic constipation: Due to chronic opiate use. Laxative discontinued due to diarrhea   Obesity with BMI 32.5: Diet, exercise and weight reduction advised.     DVT prophylaxis   Code Status: Full Code     Subjective:  Patient sleeping comfortably when seen this AM.  Wakes up briefly to voice, denies acute complaints or issues.       Physical Exam: General exam: sleeping comfortably, wakes to voice, no acute distress Respiratory system: on room air, normal respiratory effort Cardiovascular system: RRR, no peripheral edema Gastrointestinal system: soft, NT, ND Central nervous system: exam limited by somnolence, no gross focal neurologic deficits, normal speech Extremities: R AKA, LUE fistula Skin: dry, intact, normal temperature Psychiatry: exam limited by somnolence,      Family Communication: None present on rounds.  No new medical updates at this time.    Disposition: Status is: Inpatient Remains inpatient appropriate because: awaiting appeal of SNF denial    Planned Discharge Destination: SNF -- auth denied, appeal pending     Time spent: 35 minutes   Data Reviewed:     Latest Ref Rng & Units 02/26/2023    1:44 PM 02/24/2023    3:32 AM 02/23/2023    5:13 AM  BMP  Glucose 70 - 99 mg/dL 098  77  119   BUN 8 - 23 mg/dL 48  46  36   Creatinine 0.61 - 1.24 mg/dL 14.78  29.56  2.13   Sodium 135 - 145 mmol/L 135  133  131   Potassium 3.5 - 5.1 mmol/L 4.5  4.6  4.4   Chloride 98 - 111 mmol/L 97  97  94   CO2 22 - 32 mmol/L 24  25  27    Calcium 8.9  - 10.3 mg/dL 8.0  8.2  7.8     Vitals:   02/27/23 0946 02/27/23 1956 02/28/23 0458 02/28/23 0817  BP: 114/65 109/61 (!) 142/85 (!) 131/57  Pulse: 66 62 (!) 56 65  Resp: 18 16 16 18   Temp: 98.4 F (36.9 C) 98.6 F (37 C) 98.2 F (36.8 C) 98 F (36.7 C)  TempSrc: Oral Oral Oral   SpO2: 96% 99% 90% 100%  Weight:      Height:          Latest Ref Rng & Units 02/26/2023    1:44 PM 02/24/2023    3:32 AM 02/23/2023    5:13 AM  CBC  WBC 4.0 - 10.5 K/uL 5.8  6.4  6.3   Hemoglobin 13.0 - 17.0 g/dL 08.6  57.8  46.9   Hematocrit 39.0 - 52.0 % 32.3  33.7  33.5   Platelets 150 - 400 K/uL 171  147  144      Author: Pennie Banter, DO 02/28/2023 11:13 AM  For on call review [email or web address].

## 2023-02-28 NOTE — Plan of Care (Signed)

## 2023-02-28 NOTE — Plan of Care (Signed)

## 2023-02-28 NOTE — Progress Notes (Signed)
[facility]  ROUNDING NOTE   Subjective:   Adam Howell is a 62 y.o male with past medical history of diabetes, hypertension, CAD, Rt AKA, anemia, CHF, and ESRD on dialysis. Patient presents to ED with left leg pain and swelling. He has been admitted for Wound infection [T14.8XXA, L08.9] Left leg cellulitis [L03.116] Infected ulcer of skin (HCC) [L98.499, L08.9]   Patient known to our practice and receives outpatient dialysis treatments at [facility] on a MWF schedule, supervised by [facility] physicians.   Update: Patient presents sitting in room, no complaints. Plan for HD Monday   Objective:  Vital signs in last 24 hours:  Temp:  [98 F (36.7 C)-98.6 F (37 C)] 98 F (36.7 C) (12/15 0817) Pulse Rate:  [56-65] 65 (12/15 0817) Resp:  [16-18] 18 (12/15 0817) BP: (109-142)/(57-85) 131/57 (12/15 0817) SpO2:  [90 %-100 %] 100 % (12/15 0817)  Weight change:  Filed Weights   02/24/23 1230 02/26/23 1300 02/26/23 1703  Weight: 103.2 kg 104.4 kg 101.9 kg    Intake/Output: No intake/output data recorded.   Intake/Output this shift:  No intake/output data recorded.  Physical Exam: General: NAD,   Head: Normocephalic, atraumatic. Moist oral mucosal membranes  Eyes: Anicteric, PERRL  Neck: Supple, trachea midline  Lungs:  Clear to auscultation  Heart: Regular rate and rhythm  Abdomen:  Soft, nontender,   Extremities:  no peripheral edema.  Neurologic: Nonfocal, moving all four extremities  Skin: LLE wound, ace bandage  Access: Lt AVF    Basic Metabolic Panel: Recent Labs  Lab 02/22/23 0424 02/23/23 0513 02/24/23 0332 02/26/23 1344  NA 133* 131* 133* 135  K 4.5 4.4 4.6 4.5  CL 94* 94* 97* 97*  CO2 27 27 25 24   GLUCOSE 97 111* 77 114*  BUN 58* 36* 46* 48*  CREATININE 12.03* 9.01* 10.81* 11.00*  CALCIUM 7.9* 7.8* 8.2* 8.0*  PHOS  --   --   --  5.1*    Liver Function Tests: Recent Labs  Lab 02/26/23 1344  ALBUMIN 2.7*   No results for  input(s): "LIPASE", "AMYLASE" in the last 168 hours. No results for input(s): "AMMONIA" in the last 168 hours.  CBC: Recent Labs  Lab 02/22/23 0424 02/22/23 1357 02/23/23 0513 02/24/23 0332 02/26/23 1344  WBC 7.4 6.0 6.3 6.4 5.8  NEUTROABS 4.6  --  3.9 4.0  --   HGB 12.1* 11.1* 11.1* 11.4* 10.7*  HCT 36.4* 33.5* 33.5* 33.7* 32.3*  MCV 85.8 85.0 85.2 84.3 85.7  PLT 163 143* 144* 147* 171    Cardiac Enzymes: No results for input(s): "CKTOTAL", "CKMB", "CKMBINDEX", "TROPONINI" in the last 168 hours.  BNP: Invalid input(s): "POCBNP"  CBG: No results for input(s): "GLUCAP" in the last 168 hours.  Microbiology: Results for orders placed or performed during the hospital encounter of 02/09/23  Culture, blood (routine x 2)     Status: None   Collection Time: 02/09/23 10:30 AM   Specimen: BLOOD  Result Value Ref Range Status   Specimen Description BLOOD RIGHT [facility]  Final   Special Requests BOTTLES DRAWN AEROBIC AND ANAEROBIC  Final   Culture   Final    NO GROWTH 5 DAYS Performed at [facility], [address], [place], [place] [address]    Report Status 02/14/2023 FINAL  Final  Culture, blood (routine x 2)     Status: None   Collection Time: 02/09/23  9:46 PM   Specimen: BLOOD  Result Value Ref Range Status  Specimen Description BLOOD RIGHT HAND  Final   Special Requests   Final    BOTTLES DRAWN AEROBIC AND ANAEROBIC Blood Culture adequate volume   Culture   Final    NO GROWTH 5 DAYS Performed at [facility], [address] [place], [place] [address]    Report Status 02/14/2023 FINAL  Final  Aerobic Culture w Gram Stain (superficial specimen)     Status: None   Collection Time: 02/16/23  4:34 PM   Specimen: Leg; Wound  Result Value Ref Range Status   Specimen Description LEG  Final   Special Requests LEFT LEG  Final   Gram Stain NO WBC SEEN RARE GRAM POSITIVE COCCI IN PAIRS   Final   Culture   Final    RARE ENTEROCOCCUS FAECALIS RARE  CORYNEBACTERIUM STRIATUM Standardized susceptibility testing for this organism is not available.    Report Status 02/20/2023 FINAL  Final   Organism ID, Bacteria ENTEROCOCCUS FAECALIS  Final      Susceptibility   Enterococcus faecalis - MIC*    AMPICILLIN <=2 SENSITIVE Sensitive     VANCOMYCIN 1 SENSITIVE Sensitive     GENTAMICIN SYNERGY RESISTANT Resistant     * RARE ENTEROCOCCUS FAECALIS    Coagulation Studies: No results for input(s): "LABPROT", "INR" in the last 72 hours.  Urinalysis: No results for input(s): "COLORURINE", "LABSPEC", "PHURINE", "GLUCOSEU", "HGBUR", "BILIRUBINUR", "KETONESUR", "PROTEINUR", "UROBILINOGEN", "NITRITE", "LEUKOCYTESUR" in the last 72 hours.  Invalid input(s): "APPERANCEUR"    Imaging: No results found.   Medications:     amiodarone  200 mg Oral BID   apixaban  2.5 mg Oral BID   aspirin EC  81 mg Oral Daily   calcitRIOL  1.5 mcg Oral Q M,W,F-1800   cinacalcet  30 mg Oral Q breakfast   leptospermum manuka honey  1 Application Topical Daily   melatonin  2.5 mg Oral QHS   multivitamin  1 tablet Oral QHS   pregabalin  25 mg Oral Daily   rosuvastatin  40 mg Oral Daily   sevelamer carbonate  2,400 mg Oral TID WC   acetaminophen **OR** acetaminophen, hydrALAZINE, HYDROcodone-acetaminophen, loperamide, LORazepam, ondansetron **OR** ondansetron (ZOFRAN) IV  Assessment/ Plan:  Adam Howell is a 62 y.o.  male  with past medical history of diabetes, hypertension, CAD, Rt AKA, anemia, CHF, and ESRD on dialysis. Patient presents to ED with left leg pain and swelling. He has been admitted for Wound infection [T14.8XXA, L08.9] Left leg cellulitis [L03.116] Infected ulcer of skin (HCC) [L98.499, L08.9]   [facility]/MWF/Lt AVF   End stage renal disease on hemodialysis.Dialysis 12/13, 2.5L removed. Next treatment scheduled for Monday 12/15   2. Anemia of chronic kidney disease Recent Labs           Lab Results  Component Value  Date    HGB 10.7 (L) 02/26/2023      Hemoglobin within desired range.    3. Wound infection, Left leg. Imaging negative for osteomyelitis. We feel wound is more likely arterial insuffiencey and not due to calciphylaxis.  Receiving cefepime and vancomycin.  Vascular ABI study normal. Vascular surgery performed angioplasty with intervention on 02/15/2023.  Cefepime 2g and Vancomycin 1g completed   4. Secondary Hyperparathyroidism: with outpatient labs: PTH 558, phosphorus 7.0, calcium 8.2 on 01/20/23.    Recent Labs           Lab Results  Component Value Date    CALCIUM 8.0 (L) 02/26/2023    PHOS 5.1 (H)  02/26/2023      Prescribed calcitriol, cinacalcet and sevelamer outpatient. Bone minerals acceptable.      LOS: 19 Adam Howell 12/15/202412:44 PM

## 2023-03-01 DIAGNOSIS — L03116 Cellulitis of left lower limb: Secondary | ICD-10-CM | POA: Diagnosis not present

## 2023-03-01 LAB — CBC
HCT: 33.1 % — ABNORMAL LOW (ref 39.0–52.0)
Hemoglobin: 11.1 g/dL — ABNORMAL LOW (ref 13.0–17.0)
MCH: 29.2 pg (ref 26.0–34.0)
MCHC: 33.5 g/dL (ref 30.0–36.0)
MCV: 87.1 fL (ref 80.0–100.0)
Platelets: 201 10*3/uL (ref 150–400)
RBC: 3.8 MIL/uL — ABNORMAL LOW (ref 4.22–5.81)
RDW: 15.7 % — ABNORMAL HIGH (ref 11.5–15.5)
WBC: 6 10*3/uL (ref 4.0–10.5)
nRBC: 0 % (ref 0.0–0.2)

## 2023-03-01 LAB — RENAL FUNCTION PANEL
Albumin: 2.9 g/dL — ABNORMAL LOW (ref 3.5–5.0)
Anion gap: 14 (ref 5–15)
BUN: 55 mg/dL — ABNORMAL HIGH (ref 8–23)
CO2: 25 mmol/L (ref 22–32)
Calcium: 7.8 mg/dL — ABNORMAL LOW (ref 8.9–10.3)
Chloride: 96 mmol/L — ABNORMAL LOW (ref 98–111)
Creatinine, Ser: 11.67 mg/dL — ABNORMAL HIGH (ref 0.61–1.24)
GFR, Estimated: 4 mL/min — ABNORMAL LOW (ref >60)
Glucose, Bld: 85 mg/dL (ref 70–99)
Phosphorus: 5.5 mg/dL — ABNORMAL HIGH (ref 2.5–4.6)
Potassium: 4.9 mmol/L (ref 3.5–5.1)
Sodium: 135 mmol/L (ref 135–145)

## 2023-03-01 LAB — VITAMIN D 25 HYDROXY (VIT D DEFICIENCY, FRACTURES): Vit D, 25-Hydroxy: 29.05 ng/mL — ABNORMAL LOW (ref 30–100)

## 2023-03-01 LAB — FOLATE: Folate: 20.3 ng/mL (ref >5.9)

## 2023-03-01 MED ORDER — POLYSACCHARIDE IRON COMPLEX 150 MG PO CAPS
150.0000 mg | ORAL_CAPSULE | Freq: Every day | ORAL | Status: DC
  Administered 2023-03-01 – 2023-03-04 (×4): 150 mg via ORAL
  Filled 2023-03-01 (×4): qty 1

## 2023-03-01 MED ORDER — LIDOCAINE HCL (PF) 1 % IJ SOLN
5.0000 mL | INTRAMUSCULAR | Status: DC | PRN
  Filled 2023-03-01: qty 5

## 2023-03-01 MED ORDER — LIDOCAINE-PRILOCAINE 2.5-2.5 % EX CREA: 1.0000 | TOPICAL_CREAM | CUTANEOUS | Status: DC | PRN

## 2023-03-01 MED ORDER — ALTEPLASE 2 MG IJ SOLR: 2.0000 mg | Freq: Once | INTRAMUSCULAR | Status: DC | PRN

## 2023-03-01 MED ORDER — ANTICOAGULANT SODIUM CITRATE 4% (200MG/5ML) IV SOLN
5.0000 mL | Status: DC | PRN
  Filled 2023-03-01: qty 5

## 2023-03-01 MED ORDER — HEPARIN SODIUM (PORCINE) 1000 UNIT/ML DIALYSIS: 100.0000 [IU]/kg | INTRAMUSCULAR | Status: DC | PRN

## 2023-03-01 MED ORDER — HEPARIN SODIUM (PORCINE) 1000 UNIT/ML DIALYSIS: 1000.0000 [IU] | INTRAMUSCULAR | Status: DC | PRN

## 2023-03-01 MED ORDER — PENTAFLUOROPROP-TETRAFLUOROETH EX AERO: 1.0000 | INHALATION_SPRAY | CUTANEOUS | Status: DC | PRN

## 2023-03-01 NOTE — TOC Progression Note (Signed)
Transition of Care [facility]) - Progression Note    Patient Details  Name: Adam Howell MRN: [id number] Date of Birth: Nov 11, 1960  [facility] of [facility]) CM/SW Contact  Chapman Fitch, RN Phone Number: 03/01/2023, 10:56 AM  Clinical Narrative:      Per Cala Bradford with [employer] there is no appeal pending, and she shows nowhere in the system where an appeal was initiated.  She states she sees where the patient called in on 12/13 to "check status of auth" But that no appeal was iniatied  Secure chat and message sent to [facility] supervisor Steward Drone to see how to proceed       Expected Discharge Plan and Services                                               Social Determinants of Health (SDOH) Interventions SDOH Screenings   Food Insecurity: No Food Insecurity (02/09/2023)  Housing: Unknown (02/25/2023)  Transportation Needs: No Transportation Needs (02/09/2023)  Recent Concern: Transportation Needs - Unmet Transportation Needs (12/29/2022)  Utilities: Not At Risk (02/09/2023)  Alcohol Screen: Low Risk  (11/25/2021)  Depression (PHQ2-9): Low Risk  (12/29/2022)  Financial Resource Strain: Low Risk  (11/25/2021)  Physical Activity: Insufficiently Active (11/25/2021)  Social Connections: Moderately Isolated (12/29/2022)  Stress: No Stress Concern Present (11/25/2021)  Tobacco Use: Low Risk  (02/09/2023)    Readmission Risk Interventions    02/16/2023    6:21 PM  Readmission Risk Prevention Plan  Transportation Screening Complete  Palliative Care Screening Not Applicable  Medication Review (RN Care Manager) Complete

## 2023-03-01 NOTE — Progress Notes (Signed)
[facility]  ROUNDING NOTE   Subjective:   Rodrigo Pezzullo is a 62 y.o male with past medical history of diabetes, hypertension, CAD, Rt AKA, anemia, CHF, and ESRD on dialysis. Patient presents to ED with left leg pain and swelling. He has been admitted for Wound infection [T14.8XXA, L08.9] Left leg cellulitis [L03.116] Infected ulcer of skin (HCC) [L98.499, L08.9]  Patient known to our practice and receives outpatient dialysis treatments at [facility] on a MWF schedule, supervised by [facility] physicians.   Update: Patient seen and evaluated during dialysis   HEMODIALYSIS FLOWSHEET:  Blood Flow Rate (mL/min): 349 mL/min Arterial Pressure (mmHg): -150.9 mmHg Venous Pressure (mmHg): 221.2 mmHg TMP (mmHg): 12.12 mmHg Ultrafiltration Rate (mL/min): 1125 mL/min Dialysate Flow Rate (mL/min): 299 ml/min  Tolerating treatment well   Objective:  Vital signs in last 24 hours:  Temp:  [98.2 F (36.8 C)-99 F (37.2 C)] 98.5 F (36.9 C) (12/16 0838) Pulse Rate:  [54-69] 69 (12/16 1130) Resp:  [14-20] 20 (12/16 1130) BP: (105-142)/(56-81) 131/65 (12/16 1130) SpO2:  [96 %-100 %] 97 % (12/16 1130) Weight:  [103.4 kg] 103.4 kg (12/16 0838)  Weight change:  Filed Weights   02/26/23 1300 02/26/23 1703 03/01/23 0838  Weight: 104.4 kg 101.9 kg 103.4 kg    Intake/Output: No intake/output data recorded.   Intake/Output this shift:  No intake/output data recorded.  Physical Exam: General: NAD  Head: Normocephalic, atraumatic. Moist oral mucosal membranes  Eyes: Anicteric  Lungs:  Clear to auscultation, normal effort  Heart: Regular rate and rhythm  Abdomen:  Soft, nontender  Extremities: No peripheral edema.  Neurologic: Alert and oriented, moving all four extremities  Skin: LLE wound, ace bandage  Access: Lt AVF    Basic Metabolic Panel: Recent Labs  Lab 02/23/23 0513 02/24/23 0332 02/26/23 1344 03/01/23 0707  NA 131* 133* 135 135  K 4.4 4.6 4.5 4.9   CL 94* 97* 97* 96*  CO2 27 25 24 25   GLUCOSE 111* 77 114* 85  BUN 36* 46* 48* 55*  CREATININE 9.01* 10.81* 11.00* 11.67*  CALCIUM 7.8* 8.2* 8.0* 7.8*  PHOS  --   --  5.1* 5.5*    Liver Function Tests: Recent Labs  Lab 02/26/23 1344 03/01/23 0707  ALBUMIN 2.7* 2.9*    No results for input(s): "LIPASE", "AMYLASE" in the last 168 hours. No results for input(s): "AMMONIA" in the last 168 hours.  CBC: Recent Labs  Lab 02/22/23 1357 02/23/23 0513 02/24/23 0332 02/26/23 1344 03/01/23 0852  WBC 6.0 6.3 6.4 5.8 6.0  NEUTROABS  --  3.9 4.0  --   --   HGB 11.1* 11.1* 11.4* 10.7* 11.1*  HCT 33.5* 33.5* 33.7* 32.3* 33.1*  MCV 85.0 85.2 84.3 85.7 87.1  PLT 143* 144* 147* 171 201    Cardiac Enzymes: No results for input(s): "CKTOTAL", "CKMB", "CKMBINDEX", "TROPONINI" in the last 168 hours.  BNP: Invalid input(s): "POCBNP"  CBG: No results for input(s): "GLUCAP" in the last 168 hours.   Microbiology: Results for orders placed or performed during the hospital encounter of 02/09/23  Culture, blood (routine x 2)     Status: None   Collection Time: 02/09/23 10:30 AM   Specimen: BLOOD  Result Value Ref Range Status   Specimen Description BLOOD RIGHT [facility]  Final   Special Requests BOTTLES DRAWN AEROBIC AND ANAEROBIC  Final   Culture   Final    NO GROWTH 5 DAYS Performed at [facility], [address]  [place], [place] [address]    Report Status 02/14/2023 FINAL  Final  Culture, blood (routine x 2)     Status: None   Collection Time: 02/09/23  9:46 PM   Specimen: BLOOD  Result Value Ref Range Status   Specimen Description BLOOD RIGHT HAND  Final   Special Requests   Final    BOTTLES DRAWN AEROBIC AND ANAEROBIC Blood Culture adequate volume   Culture   Final    NO GROWTH 5 DAYS Performed at [facility], [address] [place], [place] [address]    Report Status 02/14/2023 FINAL  Final  Aerobic Culture w Gram Stain (superficial specimen)      Status: None   Collection Time: 02/16/23  4:34 PM   Specimen: Leg; Wound  Result Value Ref Range Status   Specimen Description LEG  Final   Special Requests LEFT LEG  Final   Gram Stain NO WBC SEEN RARE GRAM POSITIVE COCCI IN PAIRS   Final   Culture   Final    RARE ENTEROCOCCUS FAECALIS RARE CORYNEBACTERIUM STRIATUM Standardized susceptibility testing for this organism is not available.    Report Status 02/20/2023 FINAL  Final   Organism ID, Bacteria ENTEROCOCCUS FAECALIS  Final      Susceptibility   Enterococcus faecalis - MIC*    AMPICILLIN <=2 SENSITIVE Sensitive     VANCOMYCIN 1 SENSITIVE Sensitive     GENTAMICIN SYNERGY RESISTANT Resistant     * RARE ENTEROCOCCUS FAECALIS    Coagulation Studies: No results for input(s): "LABPROT", "INR" in the last 72 hours.  Urinalysis: No results for input(s): "COLORURINE", "LABSPEC", "PHURINE", "GLUCOSEU", "HGBUR", "BILIRUBINUR", "KETONESUR", "PROTEINUR", "UROBILINOGEN", "NITRITE", "LEUKOCYTESUR" in the last 72 hours.  Invalid input(s): "APPERANCEUR"    Imaging: No results found.   Medications:    anticoagulant sodium citrate       amiodarone  200 mg Oral BID   apixaban  2.5 mg Oral BID   aspirin EC  81 mg Oral Daily   calcitRIOL  1.5 mcg Oral Q M,W,F-1800   cinacalcet  30 mg Oral Q breakfast   iron polysaccharides  150 mg Oral Daily   leptospermum manuka honey  1 Application Topical Daily   melatonin  2.5 mg Oral QHS   multivitamin  1 tablet Oral QHS   pregabalin  25 mg Oral Daily   rosuvastatin  40 mg Oral Daily   sevelamer carbonate  2,400 mg Oral TID WC   acetaminophen **OR** acetaminophen, alteplase, anticoagulant sodium citrate, heparin, heparin, hydrALAZINE, HYDROcodone-acetaminophen, lidocaine (PF), lidocaine-prilocaine, loperamide, LORazepam, ondansetron **OR** ondansetron (ZOFRAN) IV, pentafluoroprop-tetrafluoroeth  Assessment/ Plan:  Mr. Fielder Gengler is a 62 y.o.  male with past medical history of  diabetes, hypertension, CAD, Rt AKA, anemia, CHF, and ESRD on dialysis. Patient presents to ED with left leg pain and swelling. He has been admitted for Wound infection [T14.8XXA, L08.9] Left leg cellulitis [L03.116] Infected ulcer of skin (HCC) [L98.499, L08.9]  [facility]/MWF/Lt AVF  End stage renal disease on hemodialysis. Dialysis received today, UF 2.5-3.5L as tolerated. Next treatment scheduled for Wednesday.   2. Anemia of chronic kidney disease Lab Results  Component Value Date   HGB 11.1 (L) 03/01/2023    Hemoglobin within desired range.   3. Wound infection, Left leg. Imaging negative for osteomyelitis. We feel wound is more likely arterial insuffiencey and not due to calciphylaxis.  Receiving cefepime and vancomycin.  Vascular ABI study normal. Vascular surgery performed angioplasty with intervention on 02/15/2023.  Antibiotics  completed.  4. Secondary Hyperparathyroidism: with outpatient labs: PTH 558, phosphorus 7.0, calcium 8.2 on 01/20/23.   Lab Results  Component Value Date   CALCIUM 7.8 (L) 03/01/2023   PHOS 5.5 (H) 03/01/2023    Prescribed calcitriol, cinacalcet and sevelamer outpatient. Monitoring bone minerals during this admission.    LOS: 20 Naidelyn Parrella 12/16/202411:44 AM

## 2023-03-01 NOTE — Progress Notes (Signed)
Progress Note   Patient: Adam Howell ZOX:[id number] DOB: October 01, 1960 DOA: 02/09/2023     20 DOS: the patient was seen and examined on 03/01/2023     Brief hospital course: "Adam Howell is 62 y.o. male male w/ PMH ESRD on HD MWF, anemia chronic kidney disease, type 2 diabetes, chronic pain syndrome, chronic HFrEF, a/p R AKA, HLD, HTN, CAD.  Presents to the emergency department from home with C/L left leg pain and swelling x 3 to 4 days, foul odor to wound and leaking fluid for past few days.  Has been seeing wound care for this, recently treated outpatient by his PCP with doxycycline but wound has gotten worse.    Patient is seen by vascular team, advised ABI and he had angiogram with percutaneous transluminal angioplasty of left posterior tibial artery and left peroneal artery on Monday. He is on broad spectrum IV antibiotic therapy.  According to infectious disease last dose is 02/24/2023" See H&P for full HPI on admission & ED course.  Further hospital course and management as outlined below.   Medically stable for discharge to SNF. Pt is appealing insurance denial for SNF.    Assessment and Plan: Ulcer left lower extremity with infection. Patient does have draining, worsening pain. MRI left lower extremity negative for osteomyelitis. Cultures 12/24/22 - Staph, enterococcus, enterobacter. Completed vancomycin and cefepime as recommended by ID last dose 02/24/2023 Vascular surgery team performed angiogram with percutaneous transluminal angioplasty of left posterior tibial artery and left peroneal artery  PT OT recommended SNF.   TOC following for placement.  Insurance denied authorization for SNF, appeal underway   Acute diarrhea - improving WBC normal no fever no abdominal pain Dr. Meriam Sprague discussed with infectious disease, felt very unlikely C. Difficile Continue as needed Imodium Monitor  ESRD on hemodialysis HD schedule MWF Nephrology following for dialysis    Hyperkalemia Improved after hemodialysis   Chronic atrial fibrillation: Continue amiodarone, Eliquis    Chronic diastolic CHF No exacerbation. GDMT limited due to low blood pressures.   Chronic pain syndrome: Continue home dose opiates, Lyrica therapy. Discontinued IV Dilaudid  continue norco as needed.   Type 2 diabetes mellitus: A1c 5.9. sugars stable. He wishes regular diet, changed to renal.   Chronic constipation: Due to chronic opiate use. Laxative discontinued due to diarrhea   Obesity with BMI 32.5: Diet, exercise and weight reduction advised.   Anemia of chronic disease, hemoglobin stable. Mild iron deficiency, started oral iron supplement. Follow folic acid level    DVT prophylaxis: Eliquis Code Status: Full Code     Subjective: Significant overnight events, patient was seen during hemodialysis, complaining of pain in the left lower extremity, no any other complaints.   Physical Exam: General exam: NAD, resting comfortably Respiratory system: on room air, normal respiratory effort Cardiovascular system: RRR, no peripheral edema Gastrointestinal system: soft, NT, ND Central nervous system: No focal deficits Extremities: R AKA, LUE fistula, LLE dressing intact, CDI Skin: dry, intact, normal temperature Psychiatry: No agitation or depression   Family Communication: None present on rounds.  No new medical updates at this time.    Disposition: Status is: Inpatient Remains inpatient appropriate because: awaiting appeal of SNF denial    Planned Discharge Destination: SNF -- auth denied, appeal pending     Time spent: 35 minutes   Data Reviewed:     Latest Ref Rng & Units 03/01/2023    7:07 AM 02/26/2023    1:44 PM 02/24/2023    3:32 AM  BMP  Glucose 70 - 99 mg/dL 85  409  77   BUN 8 - 23 mg/dL 55  48  46   Creatinine 0.61 - 1.24 mg/dL 81.19  14.78  29.56   Sodium 135 - 145 mmol/L 135  135  133   Potassium 3.5 - 5.1 mmol/L 4.9  4.5  4.6    Chloride 98 - 111 mmol/L 96  97  97   CO2 22 - 32 mmol/L 25  24  25    Calcium 8.9 - 10.3 mg/dL 7.8  8.0  8.2     Vitals:   03/01/23 1200 03/01/23 1230 03/01/23 1249 03/01/23 1337  BP: 125/71 112/72 103/64 119/74  Pulse: (!) 53 (!) 51 62 (!) 56  Resp: 14 11 15 16   Temp:   98.2 F (36.8 C)   TempSrc:   Oral   SpO2: 98% 97% 98% 99%  Weight:      Height:          Latest Ref Rng & Units 03/01/2023    8:52 AM 02/26/2023    1:44 PM 02/24/2023    3:32 AM  CBC  WBC 4.0 - 10.5 K/uL 6.0  5.8  6.4   Hemoglobin 13.0 - 17.0 g/dL 21.3  08.6  57.8   Hematocrit 39.0 - 52.0 % 33.1  32.3  33.7   Platelets 150 - 400 K/uL 201  171  147      Author: Gillis Santa, MD 03/01/2023 1:44 PM  For on call review [email or web address].

## 2023-03-02 ENCOUNTER — Telehealth: Payer: Self-pay | Admitting: Nurse Practitioner

## 2023-03-02 DIAGNOSIS — T148XXA Other injury of unspecified body region, initial encounter: Secondary | ICD-10-CM | POA: Diagnosis not present

## 2023-03-02 DIAGNOSIS — L089 Local infection of the skin and subcutaneous tissue, unspecified: Secondary | ICD-10-CM | POA: Diagnosis not present

## 2023-03-02 DIAGNOSIS — N186 End stage renal disease: Secondary | ICD-10-CM | POA: Diagnosis not present

## 2023-03-02 DIAGNOSIS — E1122 Type 2 diabetes mellitus with diabetic chronic kidney disease: Secondary | ICD-10-CM | POA: Diagnosis not present

## 2023-03-02 NOTE — Progress Notes (Signed)
Progress Note   Patient: Adam Howell WUJ:[id number] DOB: 18-Oct-1960 DOA: 02/09/2023     21 DOS: the patient was seen and examined on 03/02/2023     Brief hospital course: "Fremon Ohlman is 62 y.o. male male w/ PMH ESRD on HD MWF, anemia chronic kidney disease, type 2 diabetes, chronic pain syndrome, chronic HFrEF, a/p R AKA, HLD, HTN, CAD.  Presents to the emergency department from home with C/L left leg pain and swelling x 3 to 4 days, foul odor to wound and leaking fluid for past few days.  Has been seeing wound care for this, recently treated outpatient by his PCP with doxycycline but wound has gotten worse.    Patient is seen by vascular team, advised ABI and he had angiogram with percutaneous transluminal angioplasty of left posterior tibial artery and left peroneal artery on Monday. He is on broad spectrum IV antibiotic therapy.  According to infectious disease last dose is 02/24/2023" See H&P for full HPI on admission & ED course.  Further hospital course and management as outlined below.   Medically stable for discharge to SNF. Pt is appealing insurance denial for SNF.    Assessment and Plan: Ulcer left lower extremity with infection. Patient does have draining, worsening pain. MRI left lower extremity negative for osteomyelitis. Cultures 12/24/22 - Staph, enterococcus, enterobacter. Completed vancomycin and cefepime as recommended by ID last dose 02/24/2023 Vascular surgery team performed angiogram with percutaneous transluminal angioplasty of left posterior tibial artery and left peroneal artery  PT OT recommended SNF.   TOC following for placement.  Insurance denied authorization for SNF, appeal underway   Acute diarrhea - improving WBC normal no fever no abdominal pain Dr. Meriam Sprague discussed with infectious disease, felt very unlikely C. Difficile Continue as needed Imodium Monitor  ESRD on hemodialysis HD schedule MWF Nephrology following for dialysis    Hyperkalemia Improved after hemodialysis   Chronic atrial fibrillation: Continue amiodarone, Eliquis    Chronic diastolic CHF No exacerbation. GDMT limited due to low blood pressures.   Chronic pain syndrome: Continue home dose opiates, Lyrica therapy. Discontinued IV Dilaudid  continue norco as needed.   Type 2 diabetes mellitus: A1c 5.9. sugars stable. He wishes regular diet, changed to renal.   Chronic constipation: Due to chronic opiate use. Laxative discontinued due to diarrhea   Obesity with BMI 32.5: Diet, exercise and weight reduction advised.   Anemia of chronic disease, hemoglobin stable. Mild iron deficiency, started oral iron supplement. folic acid level wnl  Vitamin D insufficiency due to ESRD, continue Calcitrol 1.5 mcg every MWF    DVT prophylaxis: Eliquis Code Status: Full Code     Subjective: Significant overnight events, patient was sitting comfortably on the wheelchair, still has pain in the left lower extremity isolation, as per patient dressing has not been changed yet.  Denied any other complaints.  No chest pain or palpitation, no shortness of breath.    Physical Exam: General exam: NAD, resting comfortably Respiratory system: on room air, normal respiratory effort Cardiovascular system: RRR, no peripheral edema Gastrointestinal system: soft, NT, ND Central nervous system: No focal deficits Extremities: s/p R AKA, LUE fistula, LLE dressing intact, CDI Skin: dry, intact, normal temperature Psychiatry: No agitation or depression   Family Communication: None present on rounds.  No new medical updates at this time.    Disposition: Status is: Inpatient Remains inpatient appropriate because: awaiting appeal of SNF denial    Planned Discharge Destination: SNF -- auth denied, appeal pending  Time spent: 35 minutes   Data Reviewed:     Latest Ref Rng & Units 03/01/2023    7:07 AM 02/26/2023    1:44 PM 02/24/2023    3:32 AM   BMP  Glucose 70 - 99 mg/dL 85  161  77   BUN 8 - 23 mg/dL 55  48  46   Creatinine 0.61 - 1.24 mg/dL 09.60  45.40  98.11   Sodium 135 - 145 mmol/L 135  135  133   Potassium 3.5 - 5.1 mmol/L 4.9  4.5  4.6   Chloride 98 - 111 mmol/L 96  97  97   CO2 22 - 32 mmol/L 25  24  25    Calcium 8.9 - 10.3 mg/dL 7.8  8.0  8.2     Vitals:   03/01/23 1337 03/01/23 2031 03/02/23 0336 03/02/23 0734  BP: 119/74 124/77 128/72 137/82  Pulse: (!) 56 (!) 55 61 63  Resp: 16 18 18    Temp:  99.5 F (37.5 C) 98.5 F (36.9 C) 98.9 F (37.2 C)  TempSrc:  Oral Oral Oral  SpO2: 99% 100% 98% 97%  Weight:      Height:          Latest Ref Rng & Units 03/01/2023    8:52 AM 02/26/2023    1:44 PM 02/24/2023    3:32 AM  CBC  WBC 4.0 - 10.5 K/uL 6.0  5.8  6.4   Hemoglobin 13.0 - 17.0 g/dL 91.4  78.2  95.6   Hematocrit 39.0 - 52.0 % 33.1  32.3  33.7   Platelets 150 - 400 K/uL 201  171  147      Author: Gillis Santa, MD 03/02/2023 2:28 PM  For on call review [email or web address].

## 2023-03-02 NOTE — TOC Progression Note (Signed)
Transition of Care [facility]) - Progression Note    Patient Details  Name: Adam Howell MRN: [id number] Date of Birth: [birth year]  [facility]) CM/SW Contact  Chapman Fitch, RN Phone Number: 03/02/2023, 12:24 PM  Clinical Narrative:     Call placed to [facility] while in patient room  Spoke with rep [facility].  Call reference number [id number] Per Norberto Sorenson there is a pending appeal for SNF denial.  Cas ID [id number].  She states the determination to be made by 12/19.  Per [facility] and patient will not be notified by phone.  She states a letter will be mailed to the patient, however patient is currently in the hospital.  TOC to call back on 12/19 and check status        Expected Discharge Plan and Services                                               Social Determinants of Health (SDOH) Interventions SDOH Screenings   Food Insecurity: No Food Insecurity (02/09/2023)  Housing: Unknown (02/25/2023)  Transportation Needs: No Transportation Needs (02/09/2023)  Recent Concern: Transportation Needs - Unmet Transportation Needs (12/29/2022)  Utilities: Not At Risk (02/09/2023)  Alcohol Screen: Low Risk  (11/25/2021)  Depression (PHQ2-9): Low Risk  (12/29/2022)  Financial Resource Strain: Low Risk  (11/25/2021)  Physical Activity: Insufficiently Active (11/25/2021)  Social Connections: Moderately Isolated (12/29/2022)  Stress: No Stress Concern Present (11/25/2021)  Tobacco Use: Low Risk  (02/09/2023)    Readmission Risk Interventions    02/16/2023    6:21 PM  Readmission Risk Prevention Plan  Transportation Screening Complete  Palliative Care Screening Not Applicable  Medication Review (RN Care Manager) Complete

## 2023-03-02 NOTE — Progress Notes (Signed)
[facility]  ROUNDING NOTE   Subjective:   Adam Howell is a 62 y.o male with past medical history of diabetes, hypertension, CAD, Rt AKA, anemia, CHF, and ESRD on dialysis. Patient presents to ED with left leg pain and swelling. He has been admitted for Wound infection [T14.8XXA, L08.9] Left leg cellulitis [L03.116] Infected ulcer of skin (HCC) [L98.499, L08.9]  Patient known to our practice and receives outpatient dialysis treatments at [facility] on a MWF schedule, supervised by [facility] physicians.   Update: Patient seen resting in bed Breakfast tray at bedside Patient with no complaints today Denies pain or discomfort Encouraged to get out of bed later today  Objective:  Vital signs in last 24 hours:  Temp:  [98.5 F (36.9 C)-99.5 F (37.5 C)] 98.9 F (37.2 C) (12/17 0734) Pulse Rate:  [55-63] 63 (12/17 0734) Resp:  [18] 18 (12/17 0336) BP: (124-137)/(72-82) 137/82 (12/17 0734) SpO2:  [97 %-100 %] 97 % (12/17 0734)  Weight change:  Filed Weights   02/26/23 1300 02/26/23 1703 03/01/23 0838  Weight: 104.4 kg 101.9 kg 103.4 kg    Intake/Output: I/O last 3 completed shifts: In: -  Out: 3000 [Other:3000]   Intake/Output this shift:  No intake/output data recorded.  Physical Exam: General: NAD  Head: Normocephalic, atraumatic. Moist oral mucosal membranes  Eyes: Anicteric  Lungs:  Clear to auscultation, normal effort  Heart: Regular rate and rhythm  Abdomen:  Soft, nontender  Extremities: No peripheral edema.  Neurologic: Alert and oriented, moving all four extremities  Skin: LLE wound, ace bandage  Access: Lt AVF    Basic Metabolic Panel: Recent Labs  Lab 02/24/23 0332 02/26/23 1344 03/01/23 0707  NA 133* 135 135  K 4.6 4.5 4.9  CL 97* 97* 96*  CO2 25 24 25   GLUCOSE 77 114* 85  BUN 46* 48* 55*  CREATININE 10.81* 11.00* 11.67*  CALCIUM 8.2* 8.0* 7.8*  PHOS  --  5.1* 5.5*    Liver Function Tests: Recent Labs  Lab  02/26/23 1344 03/01/23 0707  ALBUMIN 2.7* 2.9*    No results for input(s): "LIPASE", "AMYLASE" in the last 168 hours. No results for input(s): "AMMONIA" in the last 168 hours.  CBC: Recent Labs  Lab 02/24/23 0332 02/26/23 1344 03/01/23 0852  WBC 6.4 5.8 6.0  NEUTROABS 4.0  --   --   HGB 11.4* 10.7* 11.1*  HCT 33.7* 32.3* 33.1*  MCV 84.3 85.7 87.1  PLT 147* 171 201    Cardiac Enzymes: No results for input(s): "CKTOTAL", "CKMB", "CKMBINDEX", "TROPONINI" in the last 168 hours.  BNP: Invalid input(s): "POCBNP"  CBG: No results for input(s): "GLUCAP" in the last 168 hours.   Microbiology: Results for orders placed or performed during the hospital encounter of 02/09/23  Culture, blood (routine x 2)     Status: None   Collection Time: 02/09/23 10:30 AM   Specimen: BLOOD  Result Value Ref Range Status   Specimen Description BLOOD RIGHT [facility]  Final   Special Requests BOTTLES DRAWN AEROBIC AND ANAEROBIC  Final   Culture   Final    NO GROWTH 5 DAYS Performed at [facility], [address], [place], [place] [address]    Report Status 02/14/2023 FINAL  Final  Culture, blood (routine x 2)     Status: None   Collection Time: 02/09/23  9:46 PM   Specimen: BLOOD  Result Value Ref Range Status   Specimen Description BLOOD RIGHT HAND  Final  Special Requests   Final    BOTTLES DRAWN AEROBIC AND ANAEROBIC Blood Culture adequate volume   Culture   Final    NO GROWTH 5 DAYS Performed at [facility], [address] [place], [place] [address]    Report Status 02/14/2023 FINAL  Final  Aerobic Culture w Gram Stain (superficial specimen)     Status: None   Collection Time: 02/16/23  4:34 PM   Specimen: Leg; Wound  Result Value Ref Range Status   Specimen Description LEG  Final   Special Requests LEFT LEG  Final   Gram Stain NO WBC SEEN RARE GRAM POSITIVE COCCI IN PAIRS   Final   Culture   Final    RARE ENTEROCOCCUS FAECALIS RARE CORYNEBACTERIUM  STRIATUM Standardized susceptibility testing for this organism is not available.    Report Status 02/20/2023 FINAL  Final   Organism ID, Bacteria ENTEROCOCCUS FAECALIS  Final      Susceptibility   Enterococcus faecalis - MIC*    AMPICILLIN <=2 SENSITIVE Sensitive     VANCOMYCIN 1 SENSITIVE Sensitive     GENTAMICIN SYNERGY RESISTANT Resistant     * RARE ENTEROCOCCUS FAECALIS    Coagulation Studies: No results for input(s): "LABPROT", "INR" in the last 72 hours.  Urinalysis: No results for input(s): "COLORURINE", "LABSPEC", "PHURINE", "GLUCOSEU", "HGBUR", "BILIRUBINUR", "KETONESUR", "PROTEINUR", "UROBILINOGEN", "NITRITE", "LEUKOCYTESUR" in the last 72 hours.  Invalid input(s): "APPERANCEUR"    Imaging: No results found.   Medications:       amiodarone  200 mg Oral BID   apixaban  2.5 mg Oral BID   aspirin EC  81 mg Oral Daily   calcitRIOL  1.5 mcg Oral Q M,W,F-1800   cinacalcet  30 mg Oral Q breakfast   iron polysaccharides  150 mg Oral Daily   leptospermum manuka honey  1 Application Topical Daily   melatonin  2.5 mg Oral QHS   multivitamin  1 tablet Oral QHS   pregabalin  25 mg Oral Daily   rosuvastatin  40 mg Oral Daily   sevelamer carbonate  2,400 mg Oral TID WC   acetaminophen **OR** acetaminophen, hydrALAZINE, HYDROcodone-acetaminophen, loperamide, LORazepam, ondansetron **OR** ondansetron (ZOFRAN) IV  Assessment/ Plan:  [name] is a 62 y.o.  male with past medical history of diabetes, hypertension, CAD, Rt AKA, anemia, CHF, and ESRD on dialysis. Patient presents to ED with left leg pain and swelling. He has been admitted for Wound infection [T14.8XXA, L08.9] Left leg cellulitis [L03.116] Infected ulcer of skin (HCC) [L98.499, L08.9]  [facility] /MWF/Lt AVF  End stage renal disease on hemodialysis.  Patient received dialysis yesterday, UF 3 L achieved.  Next treatment scheduled for Wednesday.   2. Anemia of chronic kidney disease Lab  Results  Component Value Date   HGB 11.1 (L) 03/01/2023    The remains at goal.  No need for ESA's at this time..   3. Wound infection, Left leg. Imaging negative for osteomyelitis. We feel wound is more likely arterial insuffiencey and not due to calciphylaxis.  Receiving cefepime and vancomycin.  Vascular ABI study normal. Vascular surgery performed angioplasty with intervention on 02/15/2023.  Antibiotics completed.  Continue supportive care with dressing changes.  4. Secondary Hyperparathyroidism: with outpatient labs: PTH 558, phosphorus 7.0, calcium 8.2 on 01/20/23.   Lab Results  Component Value Date   CALCIUM 7.8 (L) 03/01/2023   PHOS 5.5 (H) 03/01/2023    Prescribed calcitriol, cinacalcet and sevelamer outpatient.  Corrected calcium of 8.7.  LOS: 21 Kellianne Ek 12/17/20242:31 PM

## 2023-03-02 NOTE — Plan of Care (Signed)
  Problem: Education: Goal: Knowledge of General Education information will improve Description Including pain rating scale, medication(s)/side effects and non-pharmacologic comfort measures Outcome: Progressing   

## 2023-03-02 NOTE — Plan of Care (Signed)

## 2023-03-02 NOTE — Progress Notes (Signed)
Physical Therapy Treatment Patient Details Name: Adam Howell MRN: [id number] DOB: 24-Mar-1960 Today's Date: 03/02/2023   History of Present Illness 62 y.o. male presented to the ED from home with left leg pain and swelling and a wound with foul discharge. He has had a wound for many months now. S/p angiogram with percutaneous transluminal angioplasty of L posterior tibial artery and left peroneal artery on Monday.PMHx: ESRD on dialysis, Anemia, a-fib, CHF, chronic pain, DM, Chronic pain syndrome, Rt AKA, HTN CAD.    PT Comments  Pt alert, agreeable to PT. Reference 8/10 L foot/leg pain premedicated prior to session and did decreased to 7/10 at end of session. He was able to don L sock modI (handed sock, donned in supine in bed), and perform supine to sit with bed rails modI. Squat pivot to [facility] modI as well. He was able to propel WC ~118ft independently, demonstrated the ability to use all parts without assist (leg attachment, breaks, R arm support). Returned to room with needs in reach. The patient would benefit from further skilled PT intervention to continue to progress towards goals.    If plan is discharge home, recommend the following: A little help with walking and/or transfers;Help with stairs or ramp for entrance;Assist for transportation;A little help with bathing/dressing/bathroom;Assistance with cooking/housework   Can travel by private vehicle     Yes  Equipment Recommendations  None recommended by PT    Recommendations for Other Services       Precautions / Restrictions Precautions Precautions: Fall Restrictions Weight Bearing Restrictions Per Provider Order: No     Mobility  Bed Mobility Overal bed mobility: Modified Independent                  Transfers Overall transfer level: Modified independent Equipment used: None Transfers: Bed to chair/wheelchair/BSC       Squat pivot transfers: Modified independent (Device/Increase time)           Ambulation/Gait                   Psychologist, counselling propulsion: Both upper extremities Wheelchair parts: Independent Distance: 150   Tilt Bed    Modified Rankin (Stroke Patients Only)       Balance Overall balance assessment: Mild deficits observed, not formally tested Sitting-balance support: No upper extremity supported Sitting balance-Leahy Scale: Good                                      Cognition Arousal: Alert Behavior During Therapy: WFL for tasks assessed/performed Overall Cognitive Status: Within Functional Limits for tasks assessed                                          Exercises      General Comments        Pertinent Vitals/Pain Pain Assessment Pain Assessment: 0-10 Pain Score: 8  Pain Location: LLE-foot Pain Descriptors / Indicators: Sore, Aching Pain Intervention(s): Limited activity within patient's tolerance, Monitored during session, Repositioned    Home Living                          Prior Function  PT Goals (current goals can now be found in the care plan section) Progress towards PT goals: Progressing toward goals    Frequency    Min 1X/week      PT Plan      Co-evaluation              AM-PAC PT "6 Clicks" Mobility   Outcome Measure  Help needed turning from your back to your side while in a flat bed without using bedrails?: None Help needed moving from lying on your back to sitting on the side of a flat bed without using bedrails?: None Help needed moving to and from a bed to a chair (including a wheelchair)?: None Help needed standing up from a chair using your arms (e.g., wheelchair or bedside chair)?: None Help needed to walk in hospital room?: A Little Help needed climbing 3-5 steps with a railing? : A Little 6 Click Score: 22    End of Session   Activity Tolerance: Patient tolerated  treatment well Patient left: in chair;with call bell/phone within reach Nurse Communication: Mobility status PT Visit Diagnosis: Other abnormalities of gait and mobility (R26.89);Muscle weakness (generalized) (M62.81);Difficulty in walking, not elsewhere classified (R26.2);Pain Pain - Right/Left: Left Pain - part of body: Leg     Time: 1043-1100 PT Time Calculation (min) (ACUTE ONLY): 17 min  Charges:    $Therapeutic Activity: 8-22 mins PT General Charges $$ ACUTE PT VISIT: 1 Visit                     Olga Coaster PT, DPT 11:10 AM,03/02/23

## 2023-03-02 NOTE — Telephone Encounter (Signed)
Pt's niece Angelique Blonder is calling in requesting to speak with Jolene about pt getting a referral to a rehab facility due to his leg being amputated. Angelique Blonder said she had a physical therapist coming to her apartment but there isn't enough space for him to do it efficiently. Angelique Blonder is concerned and wants to get him somewhere he can have consistent help. Please follow up with Angelique Blonder with this matter.

## 2023-03-03 ENCOUNTER — Telehealth: Payer: Self-pay | Admitting: Nurse Practitioner

## 2023-03-03 ENCOUNTER — Ambulatory Visit: Payer: Medicare HMO | Admitting: Internal Medicine

## 2023-03-03 ENCOUNTER — Encounter (INDEPENDENT_AMBULATORY_CARE_PROVIDER_SITE_OTHER): Payer: Medicare HMO

## 2023-03-03 ENCOUNTER — Ambulatory Visit (INDEPENDENT_AMBULATORY_CARE_PROVIDER_SITE_OTHER): Payer: Medicare HMO | Admitting: Nurse Practitioner

## 2023-03-03 DIAGNOSIS — S78111A Complete traumatic amputation at level between right hip and knee, initial encounter: Secondary | ICD-10-CM | POA: Diagnosis not present

## 2023-03-03 LAB — RENAL FUNCTION PANEL
Albumin: 3 g/dL — ABNORMAL LOW (ref 3.5–5.0)
Anion gap: 13 (ref 5–15)
BUN: 58 mg/dL — ABNORMAL HIGH (ref 8–23)
CO2: 27 mmol/L (ref 22–32)
Calcium: 7.9 mg/dL — ABNORMAL LOW (ref 8.9–10.3)
Chloride: 94 mmol/L — ABNORMAL LOW (ref 98–111)
Creatinine, Ser: 11.37 mg/dL — ABNORMAL HIGH (ref 0.61–1.24)
GFR, Estimated: 5 mL/min — ABNORMAL LOW (ref >60)
Glucose, Bld: 106 mg/dL — ABNORMAL HIGH (ref 70–99)
Phosphorus: 5.6 mg/dL — ABNORMAL HIGH (ref 2.5–4.6)
Potassium: 4.7 mmol/L (ref 3.5–5.1)
Sodium: 134 mmol/L — ABNORMAL LOW (ref 135–145)

## 2023-03-03 LAB — CBC
HCT: 36.1 % — ABNORMAL LOW (ref 39.0–52.0)
Hemoglobin: 11.6 g/dL — ABNORMAL LOW (ref 13.0–17.0)
MCH: 28.4 pg (ref 26.0–34.0)
MCHC: 32.1 g/dL (ref 30.0–36.0)
MCV: 88.5 fL (ref 80.0–100.0)
Platelets: 212 10*3/uL (ref 150–400)
RBC: 4.08 MIL/uL — ABNORMAL LOW (ref 4.22–5.81)
RDW: 15.5 % (ref 11.5–15.5)
WBC: 5.6 10*3/uL (ref 4.0–10.5)
nRBC: 0 % (ref 0.0–0.2)

## 2023-03-03 MED ORDER — HEPARIN SODIUM (PORCINE) 1000 UNIT/ML DIALYSIS: 1000.0000 [IU] | INTRAMUSCULAR | Status: DC | PRN

## 2023-03-03 MED ORDER — PENTAFLUOROPROP-TETRAFLUOROETH EX AERO
1.0000 | INHALATION_SPRAY | CUTANEOUS | Status: DC | PRN
  Filled 2023-03-03: qty 30

## 2023-03-03 MED ORDER — PENTAFLUOROPROP-TETRAFLUOROETH EX AERO
INHALATION_SPRAY | CUTANEOUS | Status: AC
  Filled 2023-03-03: qty 30

## 2023-03-03 MED ORDER — LIDOCAINE-PRILOCAINE 2.5-2.5 % EX CREA
1.0000 | TOPICAL_CREAM | CUTANEOUS | Status: DC | PRN
  Filled 2023-03-03: qty 5

## 2023-03-03 NOTE — Care Management Important Message (Signed)
Important Message  Patient Details  Name: Adam Howell MRN: [id number] Date of Birth: 1960/03/21   Important Message Given:  Yes - Medicare IM     Calisa Luckenbaugh, Stephan Minister 03/03/2023, 2:38 PM

## 2023-03-03 NOTE — Progress Notes (Signed)
Progress Note   Patient: Adam Howell CWC:[id number] DOB: 1960-03-22 DOA: 02/09/2023     22 DOS: the patient was seen and examined on 03/03/2023     Brief hospital course: "Adam Howell is 62 y.o. male male w/ PMH ESRD on HD MWF, anemia chronic kidney disease, type 2 diabetes, chronic pain syndrome, chronic HFrEF, a/p R AKA, HLD, HTN, CAD.  Presents to the emergency department from home with C/L left leg pain and swelling x 3 to 4 days, foul odor to wound and leaking fluid for past few days.  Has been seeing wound care for this, recently treated outpatient by his PCP with doxycycline but wound has gotten worse.    Patient is seen by vascular team, advised ABI and he had angiogram with percutaneous transluminal angioplasty of left posterior tibial artery and left peroneal artery on Monday. He is on broad spectrum IV antibiotic therapy.  According to infectious disease last dose is 02/24/2023" See H&P for full HPI on admission & ED course.  Further hospital course and management as outlined below.   Medically stable for discharge to SNF. Pt is appealing insurance denial for SNF.    Assessment and Plan: Ulcer left lower extremity with infection. Patient does have draining, worsening pain. MRI left lower extremity negative for osteomyelitis. Cultures 12/24/22 - Staph, enterococcus, enterobacter. Completed vancomycin and cefepime as recommended by ID last dose 02/24/2023 Vascular surgery team performed angiogram with percutaneous transluminal angioplasty of left posterior tibial artery and left peroneal artery  PT OT recommended SNF.   TOC following for placement.  Insurance denied authorization for SNF, appeal underway   Acute diarrhea - improving WBC normal no fever no abdominal pain Dr. Meriam Sprague discussed with infectious disease, felt very unlikely C. Difficile Continue as needed Imodium Monitor  ESRD on hemodialysis HD schedule MWF Nephrology following for dialysis    Hyperkalemia Improved after hemodialysis   Chronic atrial fibrillation: Continue amiodarone, Eliquis    Chronic diastolic CHF No exacerbation. GDMT limited due to low blood pressures.   Chronic pain syndrome: Continue home dose opiates, Lyrica therapy. Discontinued IV Dilaudid  continue norco as needed.   Type 2 diabetes mellitus: A1c 5.9. sugars stable. He wishes regular diet, changed to renal.   Chronic constipation: Due to chronic opiate use. Laxative discontinued due to diarrhea   Obesity with BMI 32.5: Diet, exercise and weight reduction advised.   Anemia of chronic disease, hemoglobin stable. Mild iron deficiency, started oral iron supplement. folic acid level wnl  Vitamin D insufficiency due to ESRD, continue Calcitrol 1.5 mcg every MWF    DVT prophylaxis: Eliquis Code Status: Full Code     Subjective: Significant overnight events, patient was seen during hemodialysis, resting comfortably, denied any complaint but still has pain in the left lower extremity 7/10. Patient had shortness of breath earlier in the morning on the floor, which improved after hemodialysis.  No complaints at this time.    Physical Exam: General exam: NAD, resting comfortably Respiratory system: on room air, normal respiratory effort Cardiovascular system: RRR, no peripheral edema Gastrointestinal system: soft, NT, ND Central nervous system: No focal deficits Extremities: s/p R AKA, LUE fistula, LLE dressing intact, CDI Skin: dry, intact, normal temperature Psychiatry: No agitation or depression   Family Communication: None present on rounds.  No new medical updates at this time.    Disposition: Status is: Inpatient Remains inpatient appropriate because: awaiting appeal of SNF denial    Planned Discharge Destination: SNF -- auth denied, appeal pending  Time spent: 35 minutes   Data Reviewed:     Latest Ref Rng & Units 03/03/2023   10:33 AM 03/01/2023    7:07 AM  02/26/2023    1:44 PM  BMP  Glucose 70 - 99 mg/dL 130  85  865   BUN 8 - 23 mg/dL 58  55  48   Creatinine 0.61 - 1.24 mg/dL 78.46  96.29  52.84   Sodium 135 - 145 mmol/L 134  135  135   Potassium 3.5 - 5.1 mmol/L 4.7  4.9  4.5   Chloride 98 - 111 mmol/L 94  96  97   CO2 22 - 32 mmol/L 27  25  24    Calcium 8.9 - 10.3 mg/dL 7.9  7.8  8.0     Vitals:   03/03/23 1400 03/03/23 1415 03/03/23 1427 03/03/23 1548  BP: 98/75 117/72  121/77  Pulse: 70 64 67 68  Resp: 12 11 10    Temp:  98.7 F (37.1 C)  98.5 F (36.9 C)  TempSrc:  Oral  Oral  SpO2: 100% 97%  98%  Weight:      Height:          Latest Ref Rng & Units 03/03/2023   10:33 AM 03/01/2023    8:52 AM 02/26/2023    1:44 PM  CBC  WBC 4.0 - 10.5 K/uL 5.6  6.0  5.8   Hemoglobin 13.0 - 17.0 g/dL 13.2  44.0  10.2   Hematocrit 39.0 - 52.0 % 36.1  33.1  32.3   Platelets 150 - 400 K/uL 212  201  171      Author: Gillis Santa, MD 03/03/2023 4:01 PM  For on call review [email or web address].

## 2023-03-03 NOTE — Progress Notes (Signed)
[facility]  ROUNDING NOTE   Subjective:   Adam Howell is a 62 y.o male with past medical history of diabetes, hypertension, CAD, Rt AKA, anemia, CHF, and ESRD on dialysis. Patient presents to ED with left leg pain and swelling. He has been admitted for Wound infection [T14.8XXA, L08.9] Left leg cellulitis [L03.116] Infected ulcer of skin (HCC) [L98.499, L08.9]  Patient known to our practice and receives outpatient dialysis treatments at [facility] on a MWF schedule, supervised by [facility] physicians.   Update: Patient seen and evaluated during dialysis   HEMODIALYSIS FLOWSHEET:  Blood Flow Rate (mL/min): 399 mL/min Arterial Pressure (mmHg): -294.93 mmHg Venous Pressure (mmHg): 217.36 mmHg TMP (mmHg): 8.08 mmHg Ultrafiltration Rate (mL/min): 1114 mL/min Dialysate Flow Rate (mL/min): 300 ml/min  Tolerating treatment well Denies pain or discomfort Awaiting discharge planning  Objective:  Vital signs in last 24 hours:  Temp:  [98.2 F (36.8 C)-98.8 F (37.1 C)] 98.8 F (37.1 C) (12/18 1021) Pulse Rate:  [56-68] 68 (12/18 1130) Resp:  [11-18] 13 (12/18 1130) BP: (120-141)/(70-77) 120/74 (12/18 1130) SpO2:  [93 %-100 %] 98 % (12/18 1100) Weight:  [101.2 kg] 101.2 kg (12/18 1021)  Weight change:  Filed Weights   02/26/23 1703 03/01/23 0838 03/03/23 1021  Weight: 101.9 kg 103.4 kg 101.2 kg    Intake/Output: No intake/output data recorded.   Intake/Output this shift:  No intake/output data recorded.  Physical Exam: General: NAD  Head: Normocephalic, atraumatic. Moist oral mucosal membranes  Eyes: Anicteric  Lungs:  Clear to auscultation, normal effort  Heart: Regular rate and rhythm  Abdomen:  Soft, nontender  Extremities: No peripheral edema.  Neurologic: Alert and oriented, moving all four extremities  Skin: LLE wound, ace bandage  Access: Lt AVF    Basic Metabolic Panel: Recent Labs  Lab 02/26/23 1344 03/01/23 0707 03/03/23 1033   NA 135 135 134*  K 4.5 4.9 4.7  CL 97* 96* 94*  CO2 24 25 27   GLUCOSE 114* 85 106*  BUN 48* 55* 58*  CREATININE 11.00* 11.67* 11.37*  CALCIUM 8.0* 7.8* 7.9*  PHOS 5.1* 5.5* 5.6*    Liver Function Tests: Recent Labs  Lab 02/26/23 1344 03/01/23 0707 03/03/23 1033  ALBUMIN 2.7* 2.9* 3.0*    No results for input(s): "LIPASE", "AMYLASE" in the last 168 hours. No results for input(s): "AMMONIA" in the last 168 hours.  CBC: Recent Labs  Lab 02/26/23 1344 03/01/23 0852 03/03/23 1033  WBC 5.8 6.0 5.6  HGB 10.7* 11.1* 11.6*  HCT 32.3* 33.1* 36.1*  MCV 85.7 87.1 88.5  PLT 171 201 212    Cardiac Enzymes: No results for input(s): "CKTOTAL", "CKMB", "CKMBINDEX", "TROPONINI" in the last 168 hours.  BNP: Invalid input(s): "POCBNP"  CBG: No results for input(s): "GLUCAP" in the last 168 hours.   Microbiology: Results for orders placed or performed during the hospital encounter of 02/09/23  Culture, blood (routine x 2)     Status: None   Collection Time: 02/09/23 10:30 AM   Specimen: BLOOD  Result Value Ref Range Status   Specimen Description BLOOD RIGHT [facility]  Final   Special Requests BOTTLES DRAWN AEROBIC AND ANAEROBIC  Final   Culture   Final    NO GROWTH 5 DAYS Performed at [facility], [address], [place], [place] [address]    Report Status 02/14/2023 FINAL  Final  Culture, blood (routine x 2)     Status: None   Collection Time: 02/09/23  9:46 PM  Specimen: BLOOD  Result Value Ref Range Status   Specimen Description BLOOD RIGHT HAND  Final   Special Requests   Final    BOTTLES DRAWN AEROBIC AND ANAEROBIC Blood Culture adequate volume   Culture   Final    NO GROWTH 5 DAYS Performed at [facility], [address] [place], [place] [address]    Report Status 02/14/2023 FINAL  Final  Aerobic Culture w Gram Stain (superficial specimen)     Status: None   Collection Time: 02/16/23  4:34 PM   Specimen: Leg; Wound  Result Value Ref Range  Status   Specimen Description LEG  Final   Special Requests LEFT LEG  Final   Gram Stain NO WBC SEEN RARE GRAM POSITIVE COCCI IN PAIRS   Final   Culture   Final    RARE ENTEROCOCCUS FAECALIS RARE CORYNEBACTERIUM STRIATUM Standardized susceptibility testing for this organism is not available.    Report Status 02/20/2023 FINAL  Final   Organism ID, Bacteria ENTEROCOCCUS FAECALIS  Final      Susceptibility   Enterococcus faecalis - MIC*    AMPICILLIN <=2 SENSITIVE Sensitive     VANCOMYCIN 1 SENSITIVE Sensitive     GENTAMICIN SYNERGY RESISTANT Resistant     * RARE ENTEROCOCCUS FAECALIS    Coagulation Studies: No results for input(s): "LABPROT", "INR" in the last 72 hours.  Urinalysis: No results for input(s): "COLORURINE", "LABSPEC", "PHURINE", "GLUCOSEU", "HGBUR", "BILIRUBINUR", "KETONESUR", "PROTEINUR", "UROBILINOGEN", "NITRITE", "LEUKOCYTESUR" in the last 72 hours.  Invalid input(s): "APPERANCEUR"    Imaging: No results found.   Medications:       amiodarone  200 mg Oral BID   apixaban  2.5 mg Oral BID   aspirin EC  81 mg Oral Daily   calcitRIOL  1.5 mcg Oral Q M,W,F-1800   cinacalcet  30 mg Oral Q breakfast   iron polysaccharides  150 mg Oral Daily   leptospermum manuka honey  1 Application Topical Daily   melatonin  2.5 mg Oral QHS   multivitamin  1 tablet Oral QHS   pregabalin  25 mg Oral Daily   rosuvastatin  40 mg Oral Daily   sevelamer carbonate  2,400 mg Oral TID WC   acetaminophen **OR** acetaminophen, heparin, hydrALAZINE, HYDROcodone-acetaminophen, lidocaine-prilocaine, loperamide, LORazepam, ondansetron **OR** ondansetron (ZOFRAN) IV, pentafluoroprop-tetrafluoroeth  Assessment/ Plan:  [name] is a 62 y.o.  male with past medical history of diabetes, hypertension, CAD, Rt AKA, anemia, CHF, and ESRD on dialysis. Patient presents to ED with left leg pain and swelling. He has been admitted for Wound infection [T14.8XXA, L08.9] Left leg  cellulitis [L03.116] Infected ulcer of skin (HCC) [L98.499, L08.9]  [facility]/MWF/Lt AVF  End stage renal disease on hemodialysis.  Patient receiving dialysis today, UF 3 L as tolerated.  Next treatment scheduled for Friday.  2. Anemia of chronic kidney disease Lab Results  Component Value Date   HGB 11.6 (L) 03/03/2023    Hemoglobin remains within desired target.  No need for ESA's at this time..   3. Wound infection, Left leg. Imaging negative for osteomyelitis. We feel wound is more likely arterial insuffiencey and not due to calciphylaxis.  Receiving cefepime and vancomycin.  Vascular ABI study normal. Vascular surgery performed angioplasty with intervention on 02/15/2023.  Antibiotics completed.  Continue supportive care with dressing changes.  4. Secondary Hyperparathyroidism: with outpatient labs: PTH 558, phosphorus 7.0, calcium 8.2 on 01/20/23.   Lab Results  Component Value Date   CALCIUM 7.9 (  L) 03/03/2023   PHOS 5.6 (H) 03/03/2023    Prescribed calcitriol, cinacalcet and sevelamer outpatient.  Corrected calcium of 8.7.   LOS: 22 Julyana [place] 12/18/202411:44 AM

## 2023-03-03 NOTE — Progress Notes (Signed)
OT Cancellation Note  Patient Details Name: Adam Howell MRN: [id number] DOB: [birth year]   Cancelled Treatment:    Reason Eval/Treat Not Completed: Fatigue/lethargy limiting ability to participate. Pt reports fatigue following HD, defers session this date.   Kathie Dike, M.S. OTR/L  03/03/23, 4:05 PM  ascom [phone]

## 2023-03-03 NOTE — TOC Progression Note (Signed)
Transition of Care [facility]) - Progression Note    Patient Details  Name: Adam Howell MRN: [id number] Date of Birth: August 25, 1960  Transition of Care [facility]) CM/SW Contact  Chapman Fitch, RN Phone Number: 03/03/2023, 4:06 PM  Clinical Narrative:      Per [employer] portal Berkley Harvey has been approved Tanya with [facility] to confirm.  If they have everything needed they can admit tomorrow       Expected Discharge Plan and Services                                               Social Determinants of Health (SDOH) Interventions SDOH Screenings   Food Insecurity: No Food Insecurity (02/09/2023)  Housing: Unknown (02/25/2023)  Transportation Needs: No Transportation Needs (02/09/2023)  Recent Concern: Transportation Needs - Unmet Transportation Needs (12/29/2022)  Utilities: Not At Risk (02/09/2023)  Alcohol Screen: Low Risk  (11/25/2021)  Depression (PHQ2-9): Low Risk  (12/29/2022)  Financial Resource Strain: Low Risk  (11/25/2021)  Physical Activity: Insufficiently Active (11/25/2021)  Social Connections: Moderately Isolated (12/29/2022)  Stress: No Stress Concern Present (11/25/2021)  Tobacco Use: Low Risk  (02/09/2023)    Readmission Risk Interventions    02/16/2023    6:21 PM  Readmission Risk Prevention Plan  Transportation Screening Complete  Palliative Care Screening Not Applicable  Medication Review (RN Care Manager) Complete

## 2023-03-03 NOTE — Plan of Care (Signed)
Patient alert and oriented X 4, slept throughout entire night. Patient states he doesn't feel the urge to have a BM and this is the reason for his incontinence episodes. Patient thought he had   had gone on himself when checked he was dry and clean. Problem: Education: Goal: Knowledge of General Education information will improve Description: Including pain rating scale, medication(s)/side effects and non-pharmacologic comfort measures Outcome: Progressing   Problem: Health Behavior/Discharge Planning: Goal: Ability to manage health-related needs will improve Outcome: Progressing   Problem: Clinical Measurements: Goal: Ability to maintain clinical measurements within normal limits will improve Outcome: Progressing Goal: Will remain free from infection Outcome: Progressing Goal: Diagnostic test results will improve Outcome: Progressing Goal: Respiratory complications will improve Outcome: Progressing Goal: Cardiovascular complication will be avoided Outcome: Progressing   Problem: Activity: Goal: Risk for activity intolerance will decrease Outcome: Progressing   Problem: Nutrition: Goal: Adequate nutrition will be maintained Outcome: Progressing   Problem: Coping: Goal: Level of anxiety will decrease Outcome: Progressing   Problem: Elimination: Goal: Will not experience complications related to bowel motility Outcome: Progressing Goal: Will not experience complications related to urinary retention Outcome: Progressing   Problem: Pain Management: Goal: General experience of comfort will improve Outcome: Progressing   Problem: Safety: Goal: Ability to remain free from injury will improve Outcome: Progressing   Problem: Skin Integrity: Goal: Risk for impaired skin integrity will decrease Outcome: Progressing

## 2023-03-03 NOTE — TOC Progression Note (Signed)
Transition of Care [facility]) - Progression Note    Patient Details  Name: Adam Howell MRN: [id number] Date of Birth: 22-Feb-1961  Transition of Care [facility]) CM/SW Contact  Chapman Fitch, RN Phone Number: 03/03/2023, 9:09 AM  Clinical Narrative:     Received call from Inetta Fermo at [facility] requesting clinical for expedited appeal.   Clinical faxed to [phone]       Expected Discharge Plan and Services                                               Social Determinants of Health (SDOH) Interventions SDOH Screenings   Food Insecurity: No Food Insecurity (02/09/2023)  Housing: Unknown (02/25/2023)  Transportation Needs: No Transportation Needs (02/09/2023)  Recent Concern: Transportation Needs - Unmet Transportation Needs (12/29/2022)  Utilities: Not At Risk (02/09/2023)  Alcohol Screen: Low Risk  (11/25/2021)  Depression (PHQ2-9): Low Risk  (12/29/2022)  Financial Resource Strain: Low Risk  (11/25/2021)  Physical Activity: Insufficiently Active (11/25/2021)  Social Connections: Moderately Isolated (12/29/2022)  Stress: No Stress Concern Present (11/25/2021)  Tobacco Use: Low Risk  (02/09/2023)    Readmission Risk Interventions    02/16/2023    6:21 PM  Readmission Risk Prevention Plan  Transportation Screening Complete  Palliative Care Screening Not Applicable  Medication Review (RN Care Manager) Complete

## 2023-03-03 NOTE — Telephone Encounter (Signed)
Copied from CRM [id number]. Topic: General - Other >> Mar 03, 2023 12:58 PM Ja-Kwan M wrote: Reason for CRM: Pt niece Angelique Blonder called for an update on the request for a rehab facility to help pt with walking. Angelique Blonder state that pt is having problems with his other leg now and she would like to request a call back to discuss. Cb# [phone]

## 2023-03-03 NOTE — Progress Notes (Signed)
Received patient in bed to unit.    Informed consent signed and in chart.    TX duration: 3.5 hrs     Transported back to floor  Hand-off given to patient's nurse.   Access used:  lt arm avf Access issues: n/a  Total UF removed: 3000 mls      Maple Hudson, RN Dialysis Unit

## 2023-03-04 DIAGNOSIS — D638 Anemia in other chronic diseases classified elsewhere: Secondary | ICD-10-CM | POA: Diagnosis not present

## 2023-03-04 DIAGNOSIS — N186 End stage renal disease: Secondary | ICD-10-CM | POA: Diagnosis not present

## 2023-03-04 DIAGNOSIS — R2681 Unsteadiness on feet: Secondary | ICD-10-CM | POA: Diagnosis not present

## 2023-03-04 DIAGNOSIS — I120 Hypertensive chronic kidney disease with stage 5 chronic kidney disease or end stage renal disease: Secondary | ICD-10-CM | POA: Diagnosis not present

## 2023-03-04 DIAGNOSIS — Z992 Dependence on renal dialysis: Secondary | ICD-10-CM | POA: Diagnosis not present

## 2023-03-04 DIAGNOSIS — G894 Chronic pain syndrome: Secondary | ICD-10-CM | POA: Diagnosis not present

## 2023-03-04 DIAGNOSIS — M6281 Muscle weakness (generalized): Secondary | ICD-10-CM | POA: Diagnosis not present

## 2023-03-04 DIAGNOSIS — L97921 Non-pressure chronic ulcer of unspecified part of left lower leg limited to breakdown of skin: Secondary | ICD-10-CM | POA: Diagnosis not present

## 2023-03-04 DIAGNOSIS — R296 Repeated falls: Secondary | ICD-10-CM | POA: Diagnosis not present

## 2023-03-04 DIAGNOSIS — L03116 Cellulitis of left lower limb: Secondary | ICD-10-CM | POA: Diagnosis not present

## 2023-03-04 DIAGNOSIS — E1169 Type 2 diabetes mellitus with other specified complication: Secondary | ICD-10-CM | POA: Diagnosis not present

## 2023-03-04 DIAGNOSIS — E1121 Type 2 diabetes mellitus with diabetic nephropathy: Secondary | ICD-10-CM | POA: Diagnosis not present

## 2023-03-04 DIAGNOSIS — R41841 Cognitive communication deficit: Secondary | ICD-10-CM | POA: Diagnosis not present

## 2023-03-04 DIAGNOSIS — E785 Hyperlipidemia, unspecified: Secondary | ICD-10-CM | POA: Diagnosis not present

## 2023-03-04 DIAGNOSIS — D631 Anemia in chronic kidney disease: Secondary | ICD-10-CM | POA: Diagnosis not present

## 2023-03-04 DIAGNOSIS — K5909 Other constipation: Secondary | ICD-10-CM | POA: Diagnosis not present

## 2023-03-04 DIAGNOSIS — L089 Local infection of the skin and subcutaneous tissue, unspecified: Secondary | ICD-10-CM | POA: Diagnosis not present

## 2023-03-04 DIAGNOSIS — Z743 Need for continuous supervision: Secondary | ICD-10-CM | POA: Diagnosis not present

## 2023-03-04 DIAGNOSIS — L97929 Non-pressure chronic ulcer of unspecified part of left lower leg with unspecified severity: Secondary | ICD-10-CM | POA: Diagnosis not present

## 2023-03-04 DIAGNOSIS — T148XXA Other injury of unspecified body region, initial encounter: Secondary | ICD-10-CM | POA: Diagnosis not present

## 2023-03-04 DIAGNOSIS — E1122 Type 2 diabetes mellitus with diabetic chronic kidney disease: Secondary | ICD-10-CM | POA: Diagnosis not present

## 2023-03-04 DIAGNOSIS — R488 Other symbolic dysfunctions: Secondary | ICD-10-CM | POA: Diagnosis not present

## 2023-03-04 DIAGNOSIS — R69 Illness, unspecified: Secondary | ICD-10-CM | POA: Diagnosis not present

## 2023-03-04 DIAGNOSIS — N2581 Secondary hyperparathyroidism of renal origin: Secondary | ICD-10-CM | POA: Diagnosis not present

## 2023-03-04 DIAGNOSIS — Z89619 Acquired absence of unspecified leg above knee: Secondary | ICD-10-CM | POA: Diagnosis not present

## 2023-03-04 DIAGNOSIS — I482 Chronic atrial fibrillation, unspecified: Secondary | ICD-10-CM | POA: Diagnosis not present

## 2023-03-04 DIAGNOSIS — R1310 Dysphagia, unspecified: Secondary | ICD-10-CM | POA: Diagnosis not present

## 2023-03-04 MED ORDER — POLYSACCHARIDE IRON COMPLEX 150 MG PO CAPS: 150.0000 mg | ORAL_CAPSULE | Freq: Every day | ORAL | Status: DC

## 2023-03-04 MED ORDER — MELATONIN 5 MG PO TABS: 2.5000 mg | ORAL_TABLET | Freq: Every day | ORAL | Status: DC

## 2023-03-04 MED ORDER — PREGABALIN 25 MG PO CAPS: 25.0000 mg | ORAL_CAPSULE | Freq: Every day | ORAL | 0 refills | Status: DC

## 2023-03-04 MED ORDER — HYDROCODONE-ACETAMINOPHEN 10-325 MG PO TABS: 1.0000 | ORAL_TABLET | Freq: Three times a day (TID) | ORAL | 0 refills | Status: DC | PRN

## 2023-03-04 MED ORDER — ACETAMINOPHEN 325 MG PO TABS: 650.0000 mg | ORAL_TABLET | Freq: Four times a day (QID) | ORAL | Status: AC | PRN

## 2023-03-04 MED ORDER — ASPIRIN 81 MG PO TBEC: 81.0000 mg | DELAYED_RELEASE_TABLET | Freq: Every day | ORAL | Status: AC

## 2023-03-04 NOTE — TOC Transition Note (Signed)
Transition of Care [facility]) - Discharge Note   Patient Details  Name: Adam Howell MRN: [id number] Date of Birth: [birth year]  Transition [facility] Care [facility]) CM/SW Contact:  Chapman Fitch, RN Phone Number: 03/04/2023, 11:15 AM   Clinical Narrative:      Appeal decision has been determined. Patient approved for SNF at [facility]  [facility]  Anticipated DC date: 03/04/23  Family notified: Patient to update family Transport by: Wendie Simmer  Per MD patient ready for DC to . RN, patient, and facility notified of DC. Discharge Summary sent to facility. RN given number for report. DC packet on chart. Ambulance transport requested for patient.   TOC signing off.        Patient Goals and CMS Choice            Discharge Placement                       Discharge Plan and Services Additional resources added to the After Visit Summary for                                       Social Drivers of Health (SDOH) Interventions SDOH Screenings   Food Insecurity: No Food Insecurity (02/09/2023)  Housing: Unknown (02/25/2023)  Transportation Needs: No Transportation Needs (02/09/2023)  Recent Concern: Transportation Needs - Unmet Transportation Needs (12/29/2022)  Utilities: Not At Risk (02/09/2023)  Alcohol Screen: Low Risk  (11/25/2021)  Depression (PHQ2-9): Low Risk  (12/29/2022)  Financial Resource Strain: Low Risk  (11/25/2021)  Physical Activity: Insufficiently Active (11/25/2021)  Social Connections: Moderately Isolated (12/29/2022)  Stress: No Stress Concern Present (11/25/2021)  Tobacco Use: Low Risk  (02/09/2023)     Readmission Risk Interventions    02/16/2023    6:21 PM  Readmission Risk Prevention Plan  Transportation Screening Complete  Palliative Care Screening Not Applicable  Medication Review (RN Care Manager) Complete

## 2023-03-04 NOTE — Discharge Summary (Signed)
[facility] Discharge Summary   Patient: Adam Howell UJW:[id number]  PCP: Adam Skiff, NP  Date of admission: 02/09/2023   Date of discharge:  03/04/2023     Discharge Diagnoses:  Active Problems:   Wound infection of chronic right lower extremity ulcer   Type 2 diabetes mellitus with ESRD (end-stage renal disease) (HCC)   ESRD on dialysis (HCC)   Atrial fibrillation, chronic (HCC)   Anemia in chronic kidney disease   Hyperlipidemia associated with type 2 diabetes mellitus (HCC)   Atherosclerotic heart disease of native coronary artery without angina pectoris   Hypertensive heart and kidney disease with heart failure and chronic kidney disease stage V (HCC)   Chronic pain syndrome   S/P AKA (above knee amputation), right (HCC)   Ulcer of left lower extremity, limited to breakdown of skin (HCC)   Admitted From: Home Disposition:  SNF   Recommendations for Outpatient Follow-up:  Follow-up with PCP, patient should be seen by an MD at the SNF facility, continue to monitor BP and titrate medication accordingly.  Continue wound care. Follow-up with vascular surgery in 1 to 2 weeks Follow-up with nephrology and continue hemodialysis MWF schedule. Follow up LABS/TEST:     Contact information for after-discharge care     Destination     [facility] .   Service: Skilled Nursing Contact information: [address] [place] [address] [phone]                    Diet recommendation: Renal diet  Activity: The patient is advised to gradually reintroduce usual activities, as tolerated  Discharge Condition: stable  Code Status: Full code   History of present illness: As per the H and P dictated on admission Hospital Course:  "Adam Howell is 62 y.o. male male w/ PMH ESRD on HD MWF, anemia chronic kidney disease, type 2 diabetes, chronic pain syndrome, chronic HFrEF, a/p R AKA, HLD, HTN, CAD.  Presents to the  emergency department from home with C/L left leg pain and swelling x 3 to 4 days, foul odor to wound and leaking fluid for past few days.  Has been seeing wound care for this, recently treated outpatient by his PCP with doxycycline but wound has gotten worse.   Patient is seen by vascular team, advised ABI and he had angiogram with percutaneous transluminal angioplasty of left posterior tibial artery and left peroneal artery on Monday. He is on broad spectrum IV antibiotic therapy.  According to infectious disease last dose is 02/24/2023" See H&P for full HPI on admission & ED course.   Further hospital course and management as outlined below.  Medically stable for discharge to SNF. Pt did appeal insurance denial for SNF.  Patient's appeal got approved for SNF placement as per TOC.  Patient is clinically stable, medically optimized to discharge.  He agreed with the discharge planning.   Assessment and Plan: # Ulcer left lower extremity with infection Patient does have draining, worsening pain. MRI left lower extremity negative for osteomyelitis. Cultures 12/24/22 - Staph, enterococcus, enterobacter. Completed vancomycin and cefepime as recommended by ID last dose 02/24/2023 Vascular surgery team performed angiogram with percutaneous transluminal angioplasty of left posterior tibial artery and left peroneal artery.  Started aspirin 81 mg p.o. daily.  PT OT recommended SNF.  TOC following for placement.  Insurance denied authorization for SNF, appeal got approved today.  Patient is stable to be discharged to SNF.  He agreed with the discharge  planning.   # Acute diarrhea -resolved. WBC normal no fever no abdominal pain Dr. Meriam Sprague discussed with infectious disease, felt very unlikely C. Difficile Continue as needed Imodium and Monitor # ESRD on hemodialysis: HD schedule MWF, Nephrology following for dialysis # Hyperkalemia: Resolved after hemodialysis # Chronic atrial fibrillation: Continue  amiodarone, Eliquis  # Chronic diastolic CHF: No exacerbation. GDMT limited due to low blood pressures. # Chronic pain syndrome: Continue home dose opiates, Lyrica therapy.  # Type 2 diabetes mellitus: A1c 5.9. sugars stable. He wishes regular diet, changed to renal. # Chronic constipation: Due to chronic opiate use. Laxative discontinued due to diarrhea # Anemia of chronic disease, hemoglobin stable. # Mild iron deficiency, started oral iron supplement. folic acid level wnl # Vitamin D insufficiency due to ESRD, continue Calcitrol 1.5 mcg every MWF # Overweight Body mass index is 27.89 kg/m.  Nutrition Interventions: Calorie restricted diet and daily exercise advised to lose body weight.  Lifestyle modification discussed.   Pressure Injury 03/02/23 Coccyx (Active)  03/02/23 0600  Location: Coccyx  Location Orientation:   Staging:   Wound Description (Comments):   Present on Admission: No  Dressing Type Foam - Lift dressing to assess site every shift 03/04/23 0205     Pain control  - [facility] Controlled Substance Reporting System database could not be reviewed as website was not working.   -Norco 10/325 10 tablets prescribed as per SNF requirement.  - Patient was instructed, not to drive, operate heavy machinery, perform activities at heights, swimming or participation in water activities or provide baby sitting services while on Pain, Sleep and Anxiety Medications; until his outpatient Physician has advised to do so again.  - Also recommended to not to take more than prescribed Pain, Sleep and Anxiety Medications.  Patient was seen by physical therapy, who recommended Therapy, SNF placement, which was arranged. On the day of the discharge the patient's vitals were stable, and no other acute medical condition were reported by patient. the patient was felt safe to be discharge at [facility].  Consultants: Nephrology, vascular surgery Procedures: s/p LLE angiogram and  hemodialysis  Discharge Exam: General: Appear in no distress, no Rash; Oral Mucosa Clear, moist. Cardiovascular: S1 and S2 Present, no Murmur, Respiratory: normal respiratory effort, Bilateral Air entry present and no Crackles, no wheezes Abdomen: Bowel Sound present, Soft and no tenderness, no hernia Extremities: s/p R AKA, LLE wound, healing well, dressing CDI Neurology: CN grossly intact, no focal deficits.   Affect appropriate.  Filed Weights   02/26/23 1703 03/01/23 0838 03/03/23 1021  Weight: 101.9 kg 103.4 kg 101.2 kg   Vitals:   03/04/23 0415 03/04/23 0916  BP: 115/65 (!) 144/67  Pulse: (!) 59 (!) 58  Resp: 18 16  Temp: 98.4 F (36.9 C) 98.4 F (36.9 C)  SpO2: 100% 100%    DISCHARGE MEDICATION: Allergies as of 03/04/2023       Reactions   Tramadol Rash        Medication List     TAKE these medications    acetaminophen 325 MG tablet Commonly known as: TYLENOL Take 2 tablets (650 mg total) by mouth every 6 (six) hours as needed for mild pain (pain score 1-3), fever or headache (or Fever >/= 101).   amiodarone 200 MG tablet Commonly known as: PACERONE Take 1 tablet (200 mg total) by mouth 2 (two) times daily.   aspirin EC 81 MG tablet Take 1 tablet (81 mg total) by mouth daily. Swallow whole. Start  taking on: March 05, 2023   calcitRIOL 0.5 MCG capsule Commonly known as: ROCALTROL Take 3 capsules (1.5 mcg total) by mouth every Monday, Wednesday, and Friday at 6 PM.   cinacalcet 30 MG tablet Commonly known as: SENSIPAR Take 1 tablet (30 mg total) by mouth daily with breakfast.   Eliquis 2.5 MG Tabs tablet Generic drug: apixaban Take 1 tablet (2.5 mg total) by mouth 2 (two) times daily.   hydrocerin Crea Apply 1 Application topically 2 (two) times daily. To foot   HYDROcodone-acetaminophen 10-325 MG tablet Commonly known as: NORCO Take 1 tablet by mouth 3 (three) times daily as needed. What changed: Another medication with the same name was  removed. Continue taking this medication, and follow the directions you see here.   iron polysaccharides 150 MG capsule Commonly known as: NIFEREX Take 1 capsule (150 mg total) by mouth daily. Start taking on: March 05, 2023   leptospermum manuka honey Pste paste Apply 1 Application topically daily. To wound on lower left leg.   melatonin 5 MG Tabs Take 0.5 tablets (2.5 mg total) by mouth at bedtime.   multivitamin Tabs tablet Take 1 tablet by mouth at bedtime.   Nitrile Exam Gloves Large Misc To use as needed for bathing patient.   pregabalin 25 MG capsule Commonly known as: Lyrica Take 1 capsule (25 mg total) by mouth daily.   rosuvastatin 40 MG tablet Commonly known as: CRESTOR Take 1 tablet (40 mg total) by mouth daily.   Santyl 250 UNIT/GM ointment Generic drug: collagenase Apply 1 Application topically daily.   senna 8.6 MG Tabs tablet Commonly known as: SENOKOT Take 2 tablets (17.2 mg total) by mouth at bedtime.   sevelamer carbonate 800 MG tablet Commonly known as: RENVELA Take 2,400 mg by mouth 3 (three) times daily.   traZODone 100 MG tablet Commonly known as: DESYREL Take 1 tablet (100 mg total) by mouth at bedtime.               Discharge Care Instructions  (From admission, onward)           Start     Ordered   03/04/23 0000  Discharge wound care:       Comments: As above   03/04/23 1145           Allergies  Allergen Reactions   Tramadol Rash   Discharge Instructions     Call MD for:  difficulty breathing, headache or visual disturbances   Complete by: As directed    Call MD for:  extreme fatigue   Complete by: As directed    Call MD for:  persistant dizziness or light-headedness   Complete by: As directed    Call MD for:  persistant nausea and vomiting   Complete by: As directed    Call MD for:  severe uncontrolled pain   Complete by: As directed    Call MD for:  temperature >100.4   Complete by: As directed    Diet  - low sodium heart healthy   Complete by: As directed    Low potassium diet   Discharge instructions   Complete by: As directed    Follow-up with PCP, patient should be seen by an MD at the SNF facility, continue to monitor BP and titrate medication accordingly.  Continue wound care. Follow-up with vascular surgery in 1 to 2 weeks Follow-up with nephrology and continue hemodialysis MWF schedule.   Discharge wound care:   Complete by: As directed  As above   Increase activity slowly   Complete by: As directed        The results of significant diagnostics from this hospitalization (including imaging, microbiology, ancillary and laboratory) are listed below for reference.    Significant Diagnostic Studies: PERIPHERAL VASCULAR CATHETERIZATION Result Date: 02/15/2023 See surgical note for result.  US ARTERIAL ABI (SCREENING LOWER EXTREMITY) Result Date: 02/13/2023 CLINICAL DATA:  62 year old male with a history of delayed wound healing EXAM: NONINVASIVE PHYSIOLOGIC VASCULAR STUDY OF UNILATERAL LOWER EXTREMITIES TECHNIQUE: Evaluation of left lower extremities were performed at rest, including calculation of ankle-brachial indices with single level Doppler, pressure and pulse volume recording. COMPARISON:  None FINDINGS: Right ABI:  N/a Left ABI:  1.08 Right Lower Extremity:  N/a Left Lower Extremity: Segmental Doppler at the left ankle demonstrates multiphasic waveforms IMPRESSION: Resting ABI of the left lower extremity within normal limits. Segmental exam at the ankle demonstrates multiphasic waveform Signed, Yvone Neu. Miachel Roux, RPVI [facility] Electronically Signed   By: Gilmer Mor D.O.   On: 02/13/2023 06:10   MR TIBIA FIBULA LEFT W WO CONTRAST Result Date: 02/09/2023 CLINICAL DATA:  Soft tissue infection suspected, lower leg. Concern for osteomyelitis. Chronic wound on left shin, recently larger with significant  drainage. EXAM: MRI OF LOWER LEFT EXTREMITY WITHOUT AND WITH CONTRAST TECHNIQUE: Multiplanar, multisequence MR imaging of the right tibia and fibula was performed both before and after administration of intravenous contrast. CONTRAST:  10mL GADAVIST GADOBUTROL 1 MMOL/ML IV SOLN COMPARISON:  None available FINDINGS: Bones/Joint/Cartilage The distal anterior shin wound described below contacts the anteromedial cortex of the distal third of the tibial diaphysis (axial series 13, images 49-54). No marrow edema is seen in this region, however may be minimal periosteal thickening/irregularity of the peripheral aspect of this anterior tibia, corresponding to minimal lucency within the superficial cortex at the superior aspect of the wound on today's 02/09/2023 radiographs. Ligaments No ligament tear is seen. Muscles and Tendons There is moderate edema within the edema within the medial head of the gastrocnemius muscle there is edema within the peripheral predominantly anterior aspect of the distal tibialis anterior tendon at the musculotendinous junction adjacent to the distal anterior shin wound (axial series 13, images 39 through 58). Soft tissues There is again a deep wound within the distal anterior shin along an approximate 7 cm craniocaudal length. This contacts the anteromedial aspect of the tibial cortex (axial series 13 images 49 through 54). No rim enhancing abscess is seen. IMPRESSION: 1. Chronic deep wound within the distal anterior shin along an approximate 7 cm craniocaudal length. This contacts the anteromedial cortex of the distal third of the tibial diaphysis. No marrow edema is seen in this region, however there may be minimal periosteal thickening/irregularity of the peripheral aspect of this anterior tibia, corresponding to minimal lucency within the superficial cortex at the superior aspect of the wound on today's 02/09/2023 radiographs. This may represent periosteal reaction from age indeterminate  minimal infection within the peripheral periosteum but not within the tibial marrow. 2. There is edema within the medial head of the gastrocnemius muscle and within the peripheral predominantly anterior aspect of the distal tibialis anterior tendon at the musculotendinous junction adjacent to the distal anterior shin wound. 3. No rim enhancing abscess is seen. Electronically Signed   By: Neita Garnet M.D.   On: 02/09/2023 19:40   DG Tibia/Fibula Left Result Date: 02/09/2023 CLINICAL DATA:  Left leg pain and swelling for several  days. Diabetic wound is noted. EXAM: LEFT TIBIA AND FIBULA - 2 VIEW COMPARISON:  None Available. FINDINGS: There is no evidence of fracture or other focal bone lesions. Large soft tissue ulceration is seen anterior to the distal tibia. No underlying lytic destruction is noted. IMPRESSION: No definite evidence of fracture or osteomyelitis. Electronically Signed   By: Lupita Raider M.D.   On: 02/09/2023 11:31    Microbiology: No results found for this or any previous visit (from the past 240 hours).   Labs: CBC: Recent Labs  Lab 02/26/23 1344 03/01/23 0852 03/03/23 1033  WBC 5.8 6.0 5.6  HGB 10.7* 11.1* 11.6*  HCT 32.3* 33.1* 36.1*  MCV 85.7 87.1 88.5  PLT 171 201 212   Basic Metabolic Panel: Recent Labs  Lab 02/26/23 1344 03/01/23 0707 03/03/23 1033  NA 135 135 134*  K 4.5 4.9 4.7  CL 97* 96* 94*  CO2 24 25 27   GLUCOSE 114* 85 106*  BUN 48* 55* 58*  CREATININE 11.00* 11.67* 11.37*  CALCIUM 8.0* 7.8* 7.9*  PHOS 5.1* 5.5* 5.6*   Liver Function Tests: Recent Labs  Lab 02/26/23 1344 03/01/23 0707 03/03/23 1033  ALBUMIN 2.7* 2.9* 3.0*   No results for input(s): "LIPASE", "AMYLASE" in the last 168 hours. No results for input(s): "AMMONIA" in the last 168 hours. Cardiac Enzymes: No results for input(s): "CKTOTAL", "CKMB", "CKMBINDEX", "TROPONINI" in the last 168 hours. BNP (last 3 results) No results for input(s): "BNP" in the last 8760  hours. CBG: No results for input(s): "GLUCAP" in the last 168 hours.  Time spent: 35 minutes  Signed:  Gillis Santa  [facility] 03/04/2023 11:46 AM

## 2023-03-04 NOTE — Telephone Encounter (Signed)
Jolene do you know anything about this?

## 2023-03-04 NOTE — Progress Notes (Signed)
[facility]  ROUNDING NOTE   Subjective:   Adam Howell is a 62 y.o male with past medical history of diabetes, hypertension, CAD, Rt AKA, anemia, CHF, and ESRD on dialysis. Patient presents to ED with left leg pain and swelling. He has been admitted for Wound infection [T14.8XXA, L08.9] Left leg cellulitis [L03.116] Infected ulcer of skin (HCC) [L98.499, L08.9]  Patient known to our practice and receives outpatient dialysis treatments at [facility] on a MWF schedule, supervised by [facility] physicians.   Update:  Patient seen laying in bed Alert No complaints     Objective:  Vital signs in last 24 hours:  Temp:  [98.4 F (36.9 C)-99.1 F (37.3 C)] 98.4 F (36.9 C) (12/19 0916) Pulse Rate:  [58-72] 58 (12/19 0916) Resp:  [10-18] 16 (12/19 0916) BP: (98-144)/(65-77) 144/67 (12/19 0916) SpO2:  [97 %-100 %] 100 % (12/19 0916)  Weight change:  Filed Weights   02/26/23 1703 03/01/23 0838 03/03/23 1021  Weight: 101.9 kg 103.4 kg 101.2 kg    Intake/Output: I/O last 3 completed shifts: In: 0  Out: 3000 [Other:3000]   Intake/Output this shift:  No intake/output data recorded.  Physical Exam: General: NAD  Head: Normocephalic, atraumatic. Moist oral mucosal membranes  Eyes: Anicteric  Lungs:  Clear to auscultation, normal effort  Heart: Regular rate and rhythm  Abdomen:  Soft, nontender  Extremities: No peripheral edema.  Neurologic: Alert and oriented, moving all four extremities  Skin: LLE wound, ace bandage  Access: Lt AVF    Basic Metabolic Panel: Recent Labs  Lab 02/26/23 1344 03/01/23 0707 03/03/23 1033  NA 135 135 134*  K 4.5 4.9 4.7  CL 97* 96* 94*  CO2 24 25 27   GLUCOSE 114* 85 106*  BUN 48* 55* 58*  CREATININE 11.00* 11.67* 11.37*  CALCIUM 8.0* 7.8* 7.9*  PHOS 5.1* 5.5* 5.6*    Liver Function Tests: Recent Labs  Lab 02/26/23 1344 03/01/23 0707 03/03/23 1033  ALBUMIN 2.7* 2.9* 3.0*    No results for input(s):  "LIPASE", "AMYLASE" in the last 168 hours. No results for input(s): "AMMONIA" in the last 168 hours.  CBC: Recent Labs  Lab 02/26/23 1344 03/01/23 0852 03/03/23 1033  WBC 5.8 6.0 5.6  HGB 10.7* 11.1* 11.6*  HCT 32.3* 33.1* 36.1*  MCV 85.7 87.1 88.5  PLT 171 201 212    Cardiac Enzymes: No results for input(s): "CKTOTAL", "CKMB", "CKMBINDEX", "TROPONINI" in the last 168 hours.  BNP: Invalid input(s): "POCBNP"  CBG: No results for input(s): "GLUCAP" in the last 168 hours.   Microbiology: Results for orders placed or performed during the hospital encounter of 02/09/23  Culture, blood (routine x 2)     Status: None   Collection Time: 02/09/23 10:30 AM   Specimen: BLOOD  Result Value Ref Range Status   Specimen Description BLOOD RIGHT [facility]  Final   Special Requests BOTTLES DRAWN AEROBIC AND ANAEROBIC  Final   Culture   Final    NO GROWTH 5 DAYS Performed at [facility], [address], [place], [place] [address]    Report Status 02/14/2023 FINAL  Final  Culture, blood (routine x 2)     Status: None   Collection Time: 02/09/23  9:46 PM   Specimen: BLOOD  Result Value Ref Range Status   Specimen Description BLOOD RIGHT HAND  Final   Special Requests   Final    BOTTLES DRAWN AEROBIC AND ANAEROBIC Blood Culture adequate volume   Culture  Final    NO GROWTH 5 DAYS Performed at [facility], [address] [place], [place] [address]    Report Status 02/14/2023 FINAL  Final  Aerobic Culture w Gram Stain (superficial specimen)     Status: None   Collection Time: 02/16/23  4:34 PM   Specimen: Leg; Wound  Result Value Ref Range Status   Specimen Description LEG  Final   Special Requests LEFT LEG  Final   Gram Stain NO WBC SEEN RARE GRAM POSITIVE COCCI IN PAIRS   Final   Culture   Final    RARE ENTEROCOCCUS FAECALIS RARE CORYNEBACTERIUM STRIATUM Standardized susceptibility testing for this organism is not available.    Report Status 02/20/2023  FINAL  Final   Organism ID, Bacteria ENTEROCOCCUS FAECALIS  Final      Susceptibility   Enterococcus faecalis - MIC*    AMPICILLIN <=2 SENSITIVE Sensitive     VANCOMYCIN 1 SENSITIVE Sensitive     GENTAMICIN SYNERGY RESISTANT Resistant     * RARE ENTEROCOCCUS FAECALIS    Coagulation Studies: No results for input(s): "LABPROT", "INR" in the last 72 hours.  Urinalysis: No results for input(s): "COLORURINE", "LABSPEC", "PHURINE", "GLUCOSEU", "HGBUR", "BILIRUBINUR", "KETONESUR", "PROTEINUR", "UROBILINOGEN", "NITRITE", "LEUKOCYTESUR" in the last 72 hours.  Invalid input(s): "APPERANCEUR"    Imaging: No results found.   Medications:       amiodarone  200 mg Oral BID   apixaban  2.5 mg Oral BID   aspirin EC  81 mg Oral Daily   calcitRIOL  1.5 mcg Oral Q M,W,F-1800   cinacalcet  30 mg Oral Q breakfast   iron polysaccharides  150 mg Oral Daily   leptospermum manuka honey  1 Application Topical Daily   melatonin  2.5 mg Oral QHS   multivitamin  1 tablet Oral QHS   pregabalin  25 mg Oral Daily   rosuvastatin  40 mg Oral Daily   sevelamer carbonate  2,400 mg Oral TID WC   acetaminophen **OR** acetaminophen, hydrALAZINE, HYDROcodone-acetaminophen, loperamide, LORazepam, ondansetron **OR** ondansetron (ZOFRAN) IV  Assessment/ Plan:  [name] is a 62 y.o.  male with past medical history of diabetes, hypertension, CAD, Rt AKA, anemia, CHF, and ESRD on dialysis. Patient presents to ED with left leg pain and swelling. He has been admitted for Wound infection [T14.8XXA, L08.9] Left leg cellulitis [L03.116] Infected ulcer of skin (HCC) [L98.499, L08.9]  [facility]/MWF/Lt AVF  End stage renal disease on hemodialysis.  Received dialysis yesterday, UF 3L achieved. Next treatment scheduled for Friday.  2. Anemia of chronic kidney disease Lab Results  Component Value Date   HGB 11.6 (L) 03/03/2023    Hemoglobin within optimal range.  No need for ESA's at this  time..   3. Wound infection, Left leg. Imaging negative for osteomyelitis. We feel wound is more likely arterial insuffiencey and not due to calciphylaxis.  Receiving cefepime and vancomycin.  Vascular ABI study normal. Vascular surgery performed angioplasty with intervention on 02/15/2023.  Antibiotics completed.  Continue supportive care with dressing changes.  4. Secondary Hyperparathyroidism: with outpatient labs: PTH 558, phosphorus 7.0, calcium 8.2 on 01/20/23.   Lab Results  Component Value Date   CALCIUM 7.9 (L) 03/03/2023   PHOS 5.6 (H) 03/03/2023    Prescribed calcitriol, cinacalcet and sevelamer outpatient. Monitoring bone minerals.    LOS: 23 Adam Howell 12/19/202411:07 AM

## 2023-03-04 NOTE — Plan of Care (Signed)

## 2023-03-04 NOTE — Discharge Planning (Signed)
Aware that patient is discharging today to [facility]. Clinic is aware and ready to accept patient back tomorrow to resume normal MWF 6:50am arrival for a 7:10am chair time at [facility]. Please note that due to the up coming holidays, Dec. 22 - Jan. 4 patient schedule will be Sunday, Tuesday and Friday at the same time mentioned above. The clinic will be closed on Dec. 25 and Jan. 1.   Dimas Chyle Dialysis Coordinator II  [facility] Cell: [phone] eFax: [phone] [email or web address]

## 2023-03-05 DIAGNOSIS — L97929 Non-pressure chronic ulcer of unspecified part of left lower leg with unspecified severity: Secondary | ICD-10-CM | POA: Diagnosis not present

## 2023-03-05 DIAGNOSIS — E1121 Type 2 diabetes mellitus with diabetic nephropathy: Secondary | ICD-10-CM | POA: Diagnosis not present

## 2023-03-05 DIAGNOSIS — G894 Chronic pain syndrome: Secondary | ICD-10-CM | POA: Diagnosis not present

## 2023-03-05 DIAGNOSIS — K5909 Other constipation: Secondary | ICD-10-CM | POA: Diagnosis not present

## 2023-03-05 DIAGNOSIS — N186 End stage renal disease: Secondary | ICD-10-CM | POA: Diagnosis not present

## 2023-03-05 DIAGNOSIS — Z992 Dependence on renal dialysis: Secondary | ICD-10-CM | POA: Diagnosis not present

## 2023-03-05 DIAGNOSIS — N2581 Secondary hyperparathyroidism of renal origin: Secondary | ICD-10-CM | POA: Diagnosis not present

## 2023-03-05 DIAGNOSIS — I482 Chronic atrial fibrillation, unspecified: Secondary | ICD-10-CM | POA: Diagnosis not present

## 2023-03-05 DIAGNOSIS — D638 Anemia in other chronic diseases classified elsewhere: Secondary | ICD-10-CM | POA: Diagnosis not present

## 2023-03-05 NOTE — Consult Note (Signed)
   Adam Howell CM Inpatient Consult   03/05/2023  Adam Howell 1960-05-29 [id number]  Primary Care Provider:  Aura Dials, NP-[facility]  Patient is currently active with Care Management for chronic disease management services.  Patient has been engaged by a  Tourist information centre manager.  Our community based plan of care has focused on disease management and community resource support.   Patient will receive a post Howell call and will be evaluated for assessments and disease process education.   Plan: Will collaborate with the VBCI team concerning pt's discharged disposition to a recommended SNF for ongoing rehabilitation. The facility will continue to address pt's ongoing needs.  Of note, Care Management services does not replace or interfere with any services that are needed or arranged by inpatient Adam Howell care management team.   For additional questions or referrals please contact:  Adam Cousin, RN, Adam Howell [facility]   [facility], Population Health Office Hours MTWF  8:00 am-6:00 pm Direct Dial: [phone] mobile [phone] [Office toll free line] Office Hours are M-F 8:30 - 5 pm [email or web address]

## 2023-03-06 DIAGNOSIS — N186 End stage renal disease: Secondary | ICD-10-CM | POA: Diagnosis not present

## 2023-03-06 DIAGNOSIS — Z992 Dependence on renal dialysis: Secondary | ICD-10-CM | POA: Diagnosis not present

## 2023-03-06 DIAGNOSIS — I120 Hypertensive chronic kidney disease with stage 5 chronic kidney disease or end stage renal disease: Secondary | ICD-10-CM | POA: Diagnosis not present

## 2023-03-07 DIAGNOSIS — I120 Hypertensive chronic kidney disease with stage 5 chronic kidney disease or end stage renal disease: Secondary | ICD-10-CM | POA: Diagnosis not present

## 2023-03-07 DIAGNOSIS — N2581 Secondary hyperparathyroidism of renal origin: Secondary | ICD-10-CM | POA: Diagnosis not present

## 2023-03-07 DIAGNOSIS — Z992 Dependence on renal dialysis: Secondary | ICD-10-CM | POA: Diagnosis not present

## 2023-03-07 DIAGNOSIS — N186 End stage renal disease: Secondary | ICD-10-CM | POA: Diagnosis not present

## 2023-03-08 DIAGNOSIS — N186 End stage renal disease: Secondary | ICD-10-CM | POA: Diagnosis not present

## 2023-03-08 DIAGNOSIS — G894 Chronic pain syndrome: Secondary | ICD-10-CM | POA: Diagnosis not present

## 2023-03-08 DIAGNOSIS — L97929 Non-pressure chronic ulcer of unspecified part of left lower leg with unspecified severity: Secondary | ICD-10-CM | POA: Diagnosis not present

## 2023-03-08 DIAGNOSIS — I120 Hypertensive chronic kidney disease with stage 5 chronic kidney disease or end stage renal disease: Secondary | ICD-10-CM | POA: Diagnosis not present

## 2023-03-08 DIAGNOSIS — I482 Chronic atrial fibrillation, unspecified: Secondary | ICD-10-CM | POA: Diagnosis not present

## 2023-03-08 DIAGNOSIS — Z89619 Acquired absence of unspecified leg above knee: Secondary | ICD-10-CM | POA: Diagnosis not present

## 2023-03-08 DIAGNOSIS — Z992 Dependence on renal dialysis: Secondary | ICD-10-CM | POA: Diagnosis not present

## 2023-03-09 ENCOUNTER — Encounter: Payer: Self-pay | Admitting: *Deleted

## 2023-03-09 DIAGNOSIS — N2581 Secondary hyperparathyroidism of renal origin: Secondary | ICD-10-CM | POA: Diagnosis not present

## 2023-03-09 DIAGNOSIS — E1169 Type 2 diabetes mellitus with other specified complication: Secondary | ICD-10-CM | POA: Diagnosis not present

## 2023-03-09 DIAGNOSIS — E785 Hyperlipidemia, unspecified: Secondary | ICD-10-CM | POA: Diagnosis not present

## 2023-03-09 DIAGNOSIS — G894 Chronic pain syndrome: Secondary | ICD-10-CM | POA: Diagnosis not present

## 2023-03-09 DIAGNOSIS — Z992 Dependence on renal dialysis: Secondary | ICD-10-CM | POA: Diagnosis not present

## 2023-03-09 DIAGNOSIS — I120 Hypertensive chronic kidney disease with stage 5 chronic kidney disease or end stage renal disease: Secondary | ICD-10-CM | POA: Diagnosis not present

## 2023-03-09 DIAGNOSIS — N186 End stage renal disease: Secondary | ICD-10-CM | POA: Diagnosis not present

## 2023-03-09 DIAGNOSIS — E1122 Type 2 diabetes mellitus with diabetic chronic kidney disease: Secondary | ICD-10-CM | POA: Diagnosis not present

## 2023-03-09 DIAGNOSIS — L97929 Non-pressure chronic ulcer of unspecified part of left lower leg with unspecified severity: Secondary | ICD-10-CM | POA: Diagnosis not present

## 2023-03-10 DIAGNOSIS — Z992 Dependence on renal dialysis: Secondary | ICD-10-CM | POA: Diagnosis not present

## 2023-03-10 DIAGNOSIS — I120 Hypertensive chronic kidney disease with stage 5 chronic kidney disease or end stage renal disease: Secondary | ICD-10-CM | POA: Diagnosis not present

## 2023-03-10 DIAGNOSIS — N186 End stage renal disease: Secondary | ICD-10-CM | POA: Diagnosis not present

## 2023-03-11 ENCOUNTER — Ambulatory Visit: Payer: Self-pay | Admitting: *Deleted

## 2023-03-11 DIAGNOSIS — Z992 Dependence on renal dialysis: Secondary | ICD-10-CM | POA: Diagnosis not present

## 2023-03-11 DIAGNOSIS — G894 Chronic pain syndrome: Secondary | ICD-10-CM | POA: Diagnosis not present

## 2023-03-11 DIAGNOSIS — I120 Hypertensive chronic kidney disease with stage 5 chronic kidney disease or end stage renal disease: Secondary | ICD-10-CM | POA: Diagnosis not present

## 2023-03-11 DIAGNOSIS — R296 Repeated falls: Secondary | ICD-10-CM | POA: Diagnosis not present

## 2023-03-11 DIAGNOSIS — N186 End stage renal disease: Secondary | ICD-10-CM | POA: Diagnosis not present

## 2023-03-11 NOTE — Patient Outreach (Signed)
  Care Coordination   03/11/2023 Name: Sawyer Sutherby MRN: [id number] DOB: 1960/11/17   Care Coordination Outreach Attempts:  An unsuccessful telephone outreach was attempted today to offer the patient information about available complex care management services.  Follow Up Plan:  Additional outreach attempts will be made to offer the patient complex care management information and services.   Encounter Outcome:  No Answer   Care Coordination Interventions:  No, not indicated    Brenten Janney, LCSW [facility]  [facility], [facility] Licensed Clinical Social Worker Care Coordinator  Direct Dial: [phone]

## 2023-03-12 DIAGNOSIS — Z992 Dependence on renal dialysis: Secondary | ICD-10-CM | POA: Diagnosis not present

## 2023-03-12 DIAGNOSIS — N2581 Secondary hyperparathyroidism of renal origin: Secondary | ICD-10-CM | POA: Diagnosis not present

## 2023-03-12 DIAGNOSIS — N186 End stage renal disease: Secondary | ICD-10-CM | POA: Diagnosis not present

## 2023-03-12 DIAGNOSIS — I120 Hypertensive chronic kidney disease with stage 5 chronic kidney disease or end stage renal disease: Secondary | ICD-10-CM | POA: Diagnosis not present

## 2023-03-13 DIAGNOSIS — Z992 Dependence on renal dialysis: Secondary | ICD-10-CM | POA: Diagnosis not present

## 2023-03-13 DIAGNOSIS — I120 Hypertensive chronic kidney disease with stage 5 chronic kidney disease or end stage renal disease: Secondary | ICD-10-CM | POA: Diagnosis not present

## 2023-03-13 DIAGNOSIS — N186 End stage renal disease: Secondary | ICD-10-CM | POA: Diagnosis not present

## 2023-03-14 DIAGNOSIS — Z992 Dependence on renal dialysis: Secondary | ICD-10-CM | POA: Diagnosis not present

## 2023-03-14 DIAGNOSIS — N2581 Secondary hyperparathyroidism of renal origin: Secondary | ICD-10-CM | POA: Diagnosis not present

## 2023-03-14 DIAGNOSIS — I120 Hypertensive chronic kidney disease with stage 5 chronic kidney disease or end stage renal disease: Secondary | ICD-10-CM | POA: Diagnosis not present

## 2023-03-14 DIAGNOSIS — N186 End stage renal disease: Secondary | ICD-10-CM | POA: Diagnosis not present

## 2023-03-15 DIAGNOSIS — N186 End stage renal disease: Secondary | ICD-10-CM | POA: Diagnosis not present

## 2023-03-15 DIAGNOSIS — I120 Hypertensive chronic kidney disease with stage 5 chronic kidney disease or end stage renal disease: Secondary | ICD-10-CM | POA: Diagnosis not present

## 2023-03-15 DIAGNOSIS — Z992 Dependence on renal dialysis: Secondary | ICD-10-CM | POA: Diagnosis not present

## 2023-03-16 DIAGNOSIS — I120 Hypertensive chronic kidney disease with stage 5 chronic kidney disease or end stage renal disease: Secondary | ICD-10-CM | POA: Diagnosis not present

## 2023-03-16 DIAGNOSIS — N2581 Secondary hyperparathyroidism of renal origin: Secondary | ICD-10-CM | POA: Diagnosis not present

## 2023-03-16 DIAGNOSIS — N186 End stage renal disease: Secondary | ICD-10-CM | POA: Diagnosis not present

## 2023-03-16 DIAGNOSIS — Z992 Dependence on renal dialysis: Secondary | ICD-10-CM | POA: Diagnosis not present

## 2023-03-17 DIAGNOSIS — I120 Hypertensive chronic kidney disease with stage 5 chronic kidney disease or end stage renal disease: Secondary | ICD-10-CM | POA: Diagnosis not present

## 2023-03-17 DIAGNOSIS — T1490XA Injury, unspecified, initial encounter: Secondary | ICD-10-CM | POA: Diagnosis not present

## 2023-03-17 DIAGNOSIS — Z89611 Acquired absence of right leg above knee: Secondary | ICD-10-CM | POA: Diagnosis not present

## 2023-03-17 DIAGNOSIS — I2510 Atherosclerotic heart disease of native coronary artery without angina pectoris: Secondary | ICD-10-CM | POA: Diagnosis not present

## 2023-03-17 DIAGNOSIS — R519 Headache, unspecified: Secondary | ICD-10-CM | POA: Diagnosis not present

## 2023-03-17 DIAGNOSIS — R820 Chyluria: Secondary | ICD-10-CM | POA: Diagnosis not present

## 2023-03-17 DIAGNOSIS — R319 Hematuria, unspecified: Secondary | ICD-10-CM | POA: Diagnosis not present

## 2023-03-17 DIAGNOSIS — N186 End stage renal disease: Secondary | ICD-10-CM | POA: Diagnosis not present

## 2023-03-17 DIAGNOSIS — S81802A Unspecified open wound, left lower leg, initial encounter: Secondary | ICD-10-CM | POA: Diagnosis not present

## 2023-03-17 DIAGNOSIS — M6281 Muscle weakness (generalized): Secondary | ICD-10-CM | POA: Diagnosis not present

## 2023-03-17 DIAGNOSIS — E1122 Type 2 diabetes mellitus with diabetic chronic kidney disease: Secondary | ICD-10-CM | POA: Diagnosis not present

## 2023-03-17 DIAGNOSIS — N390 Urinary tract infection, site not specified: Secondary | ICD-10-CM | POA: Diagnosis not present

## 2023-03-17 DIAGNOSIS — R2681 Unsteadiness on feet: Secondary | ICD-10-CM | POA: Diagnosis not present

## 2023-03-17 DIAGNOSIS — E785 Hyperlipidemia, unspecified: Secondary | ICD-10-CM | POA: Diagnosis not present

## 2023-03-17 DIAGNOSIS — Z992 Dependence on renal dialysis: Secondary | ICD-10-CM | POA: Diagnosis not present

## 2023-03-17 DIAGNOSIS — I5022 Chronic systolic (congestive) heart failure: Secondary | ICD-10-CM | POA: Diagnosis not present

## 2023-03-17 DIAGNOSIS — I5020 Unspecified systolic (congestive) heart failure: Secondary | ICD-10-CM | POA: Diagnosis not present

## 2023-03-17 DIAGNOSIS — R1310 Dysphagia, unspecified: Secondary | ICD-10-CM | POA: Diagnosis not present

## 2023-03-17 DIAGNOSIS — D689 Coagulation defect, unspecified: Secondary | ICD-10-CM | POA: Diagnosis not present

## 2023-03-17 DIAGNOSIS — L299 Pruritus, unspecified: Secondary | ICD-10-CM | POA: Diagnosis not present

## 2023-03-17 DIAGNOSIS — R488 Other symbolic dysfunctions: Secondary | ICD-10-CM | POA: Diagnosis not present

## 2023-03-17 DIAGNOSIS — R52 Pain, unspecified: Secondary | ICD-10-CM | POA: Diagnosis not present

## 2023-03-17 DIAGNOSIS — Z1330 Encounter for screening examination for mental health and behavioral disorders, unspecified: Secondary | ICD-10-CM | POA: Diagnosis not present

## 2023-03-17 DIAGNOSIS — I739 Peripheral vascular disease, unspecified: Secondary | ICD-10-CM | POA: Diagnosis not present

## 2023-03-17 DIAGNOSIS — I959 Hypotension, unspecified: Secondary | ICD-10-CM | POA: Diagnosis not present

## 2023-03-17 DIAGNOSIS — L03116 Cellulitis of left lower limb: Secondary | ICD-10-CM | POA: Diagnosis not present

## 2023-03-17 DIAGNOSIS — S78111A Complete traumatic amputation at level between right hip and knee, initial encounter: Secondary | ICD-10-CM | POA: Diagnosis not present

## 2023-03-17 DIAGNOSIS — L97929 Non-pressure chronic ulcer of unspecified part of left lower leg with unspecified severity: Secondary | ICD-10-CM | POA: Diagnosis not present

## 2023-03-17 DIAGNOSIS — I482 Chronic atrial fibrillation, unspecified: Secondary | ICD-10-CM | POA: Diagnosis not present

## 2023-03-17 DIAGNOSIS — I132 Hypertensive heart and chronic kidney disease with heart failure and with stage 5 chronic kidney disease, or end stage renal disease: Secondary | ICD-10-CM | POA: Diagnosis not present

## 2023-03-17 DIAGNOSIS — L089 Local infection of the skin and subcutaneous tissue, unspecified: Secondary | ICD-10-CM | POA: Diagnosis not present

## 2023-03-17 DIAGNOSIS — G894 Chronic pain syndrome: Secondary | ICD-10-CM | POA: Diagnosis not present

## 2023-03-17 DIAGNOSIS — D631 Anemia in chronic kidney disease: Secondary | ICD-10-CM | POA: Diagnosis not present

## 2023-03-17 DIAGNOSIS — N2581 Secondary hyperparathyroidism of renal origin: Secondary | ICD-10-CM | POA: Diagnosis not present

## 2023-03-17 DIAGNOSIS — J449 Chronic obstructive pulmonary disease, unspecified: Secondary | ICD-10-CM | POA: Diagnosis not present

## 2023-03-17 DIAGNOSIS — Z89619 Acquired absence of unspecified leg above knee: Secondary | ICD-10-CM | POA: Diagnosis not present

## 2023-03-17 DIAGNOSIS — H5789 Other specified disorders of eye and adnexa: Secondary | ICD-10-CM | POA: Diagnosis not present

## 2023-03-17 DIAGNOSIS — L97911 Non-pressure chronic ulcer of unspecified part of right lower leg limited to breakdown of skin: Secondary | ICD-10-CM | POA: Diagnosis not present

## 2023-03-17 DIAGNOSIS — Z9889 Other specified postprocedural states: Secondary | ICD-10-CM | POA: Diagnosis not present

## 2023-03-17 DIAGNOSIS — E1169 Type 2 diabetes mellitus with other specified complication: Secondary | ICD-10-CM | POA: Diagnosis not present

## 2023-03-19 DIAGNOSIS — L299 Pruritus, unspecified: Secondary | ICD-10-CM | POA: Diagnosis not present

## 2023-03-19 DIAGNOSIS — N2581 Secondary hyperparathyroidism of renal origin: Secondary | ICD-10-CM | POA: Diagnosis not present

## 2023-03-19 DIAGNOSIS — L97929 Non-pressure chronic ulcer of unspecified part of left lower leg with unspecified severity: Secondary | ICD-10-CM | POA: Diagnosis not present

## 2023-03-19 DIAGNOSIS — Z89619 Acquired absence of unspecified leg above knee: Secondary | ICD-10-CM | POA: Diagnosis not present

## 2023-03-19 DIAGNOSIS — D689 Coagulation defect, unspecified: Secondary | ICD-10-CM | POA: Diagnosis not present

## 2023-03-19 DIAGNOSIS — N186 End stage renal disease: Secondary | ICD-10-CM | POA: Diagnosis not present

## 2023-03-19 DIAGNOSIS — R519 Headache, unspecified: Secondary | ICD-10-CM | POA: Diagnosis not present

## 2023-03-19 DIAGNOSIS — R52 Pain, unspecified: Secondary | ICD-10-CM | POA: Diagnosis not present

## 2023-03-19 DIAGNOSIS — Z992 Dependence on renal dialysis: Secondary | ICD-10-CM | POA: Diagnosis not present

## 2023-03-22 DIAGNOSIS — N2581 Secondary hyperparathyroidism of renal origin: Secondary | ICD-10-CM | POA: Diagnosis not present

## 2023-03-22 DIAGNOSIS — R52 Pain, unspecified: Secondary | ICD-10-CM | POA: Diagnosis not present

## 2023-03-22 DIAGNOSIS — Z992 Dependence on renal dialysis: Secondary | ICD-10-CM | POA: Diagnosis not present

## 2023-03-22 DIAGNOSIS — D689 Coagulation defect, unspecified: Secondary | ICD-10-CM | POA: Diagnosis not present

## 2023-03-22 DIAGNOSIS — R519 Headache, unspecified: Secondary | ICD-10-CM | POA: Diagnosis not present

## 2023-03-22 DIAGNOSIS — L299 Pruritus, unspecified: Secondary | ICD-10-CM | POA: Diagnosis not present

## 2023-03-22 DIAGNOSIS — N186 End stage renal disease: Secondary | ICD-10-CM | POA: Diagnosis not present

## 2023-03-24 DIAGNOSIS — R52 Pain, unspecified: Secondary | ICD-10-CM | POA: Diagnosis not present

## 2023-03-24 DIAGNOSIS — D689 Coagulation defect, unspecified: Secondary | ICD-10-CM | POA: Diagnosis not present

## 2023-03-24 DIAGNOSIS — R519 Headache, unspecified: Secondary | ICD-10-CM | POA: Diagnosis not present

## 2023-03-24 DIAGNOSIS — N2581 Secondary hyperparathyroidism of renal origin: Secondary | ICD-10-CM | POA: Diagnosis not present

## 2023-03-24 DIAGNOSIS — Z992 Dependence on renal dialysis: Secondary | ICD-10-CM | POA: Diagnosis not present

## 2023-03-24 DIAGNOSIS — L299 Pruritus, unspecified: Secondary | ICD-10-CM | POA: Diagnosis not present

## 2023-03-24 DIAGNOSIS — N186 End stage renal disease: Secondary | ICD-10-CM | POA: Diagnosis not present

## 2023-03-25 DIAGNOSIS — N186 End stage renal disease: Secondary | ICD-10-CM | POA: Diagnosis not present

## 2023-03-25 DIAGNOSIS — R319 Hematuria, unspecified: Secondary | ICD-10-CM | POA: Diagnosis not present

## 2023-03-26 DIAGNOSIS — D689 Coagulation defect, unspecified: Secondary | ICD-10-CM | POA: Diagnosis not present

## 2023-03-26 DIAGNOSIS — L97929 Non-pressure chronic ulcer of unspecified part of left lower leg with unspecified severity: Secondary | ICD-10-CM | POA: Diagnosis not present

## 2023-03-26 DIAGNOSIS — N186 End stage renal disease: Secondary | ICD-10-CM | POA: Diagnosis not present

## 2023-03-26 DIAGNOSIS — L299 Pruritus, unspecified: Secondary | ICD-10-CM | POA: Diagnosis not present

## 2023-03-26 DIAGNOSIS — R52 Pain, unspecified: Secondary | ICD-10-CM | POA: Diagnosis not present

## 2023-03-26 DIAGNOSIS — R519 Headache, unspecified: Secondary | ICD-10-CM | POA: Diagnosis not present

## 2023-03-26 DIAGNOSIS — N390 Urinary tract infection, site not specified: Secondary | ICD-10-CM | POA: Diagnosis not present

## 2023-03-26 DIAGNOSIS — Z992 Dependence on renal dialysis: Secondary | ICD-10-CM | POA: Diagnosis not present

## 2023-03-26 DIAGNOSIS — N2581 Secondary hyperparathyroidism of renal origin: Secondary | ICD-10-CM | POA: Diagnosis not present

## 2023-03-29 DIAGNOSIS — N186 End stage renal disease: Secondary | ICD-10-CM | POA: Diagnosis not present

## 2023-03-29 DIAGNOSIS — D689 Coagulation defect, unspecified: Secondary | ICD-10-CM | POA: Diagnosis not present

## 2023-03-29 DIAGNOSIS — R52 Pain, unspecified: Secondary | ICD-10-CM | POA: Diagnosis not present

## 2023-03-29 DIAGNOSIS — R519 Headache, unspecified: Secondary | ICD-10-CM | POA: Diagnosis not present

## 2023-03-29 DIAGNOSIS — Z992 Dependence on renal dialysis: Secondary | ICD-10-CM | POA: Diagnosis not present

## 2023-03-29 DIAGNOSIS — N2581 Secondary hyperparathyroidism of renal origin: Secondary | ICD-10-CM | POA: Diagnosis not present

## 2023-03-29 DIAGNOSIS — L299 Pruritus, unspecified: Secondary | ICD-10-CM | POA: Diagnosis not present

## 2023-03-30 ENCOUNTER — Encounter (INDEPENDENT_AMBULATORY_CARE_PROVIDER_SITE_OTHER): Payer: Self-pay | Admitting: Nurse Practitioner

## 2023-03-30 ENCOUNTER — Ambulatory Visit (INDEPENDENT_AMBULATORY_CARE_PROVIDER_SITE_OTHER): Payer: 59 | Admitting: Nurse Practitioner

## 2023-03-30 VITALS — BP 97/61 | HR 64 | Resp 18 | Ht 75.0 in | Wt 222.0 lb

## 2023-03-30 DIAGNOSIS — N186 End stage renal disease: Secondary | ICD-10-CM

## 2023-03-30 DIAGNOSIS — L97911 Non-pressure chronic ulcer of unspecified part of right lower leg limited to breakdown of skin: Secondary | ICD-10-CM | POA: Diagnosis not present

## 2023-03-30 NOTE — Progress Notes (Signed)
 Subjective:    [name], male    DOB: 14-Nov-1960, 63 y.o.   MRN: [id number] Chief Complaint  Patient presents with   Follow-up    2-3 week hospital f/u    The patient returns today for evaluation of his left lower extremity postintervention.  He underwent intervention on 02/15/2023:   Procedure(s) Performed:             1.  Ultrasound guidance for vascular access right femoral artery             2.  Catheter placement into left SFA from right femoral approach             3.  Aortogram and selective left lower extremity angiogram             4.  Percutaneous transluminal angioplasty of left posterior tibial artery with 3 mm diameter by 10 cm length angioplasty balloon             5.  Percutaneous transluminal angioplasty of left peroneal artery with 2.5 mm diameter by 30 cm length angioplasty balloon and 2 mm diameter by 10 cm length angioplasty balloon             6.  StarClose closure device right femoral artery   He continues to have the wound and is currently being treated by wound care.  Today the wound appears to have good bleeding with good granulation.  He does not walk significantly given his history of a right above-knee amputation.  The patient feels that it is improving.    Review of Systems  Skin:  Positive for wound.  Neurological:  Positive for weakness.  All other systems reviewed and are negative.      Objective:   Physical Exam Vitals reviewed.  HENT:     Head: Normocephalic.  Cardiovascular:     Rate and Rhythm: Normal rate.  Pulmonary:     Effort: Pulmonary effort is normal.  Musculoskeletal:     Right Lower Extremity: Right leg is amputated above knee.  Skin:    General: Skin is warm and dry.  Neurological:     Mental Status: [name] and oriented to person, place, and time.  Psychiatric:        Mood and Affect: Mood normal.        Behavior: Behavior normal.        Thought Content: Thought content normal.         Judgment: Judgment normal.     BP 97/61   Pulse 64   Resp 18   Ht 6' 3 (1.905 m)   Wt 222 lb (100.7 kg)   BMI 27.75 kg/m   Past Medical History:  Diagnosis Date   Chronic kidney disease    Congestive heart failure (HCC)    Diabetes mellitus without complication (HCC)    Difficult intubation    Hyperlipidemia    Hypertension     Social History   Socioeconomic History   Marital status: Divorced    Spouse name: Cobi Delph   Number of children: 4   Years of education: Not on file   Highest education level: Not on file  Occupational History   Not on file  Tobacco Use   Smoking status: Never   Smokeless tobacco: Never  Vaping Use   Vaping status: Never Used  Substance and Sexual Activity   Alcohol use: Not Currently   Drug use: Never   Sexual activity: Not  Currently  Other Topics Concern   Not on file  Social History Narrative   Lives with daughter Rosaline.     Social Drivers of Corporate Investment Banker Strain: Low Risk  (11/25/2021)   Overall Financial Resource Strain (CARDIA)    Difficulty of Paying Living Expenses: Not hard at all  Food Insecurity: No Food Insecurity (02/09/2023)   Hunger Vital Sign    Worried About Running Out of Food in the Last Year: Never true    Ran Out of Food in the Last Year: Never true  Transportation Needs: No Transportation Needs (02/09/2023)   PRAPARE - Administrator, Civil Service (Medical): No    Lack of Transportation (Non-Medical): No  Recent Concern: Transportation Needs - Unmet Transportation Needs (12/29/2022)   PRAPARE - Transportation    Lack of Transportation (Medical): Yes    Lack of Transportation (Non-Medical): Yes  Physical Activity: Insufficiently Active (11/25/2021)   Exercise Vital Sign    Days of Exercise per Week: 2 days    Minutes of Exercise per Session: 60 min  Stress: No Stress Concern Present (11/25/2021)   [employer] of Occupational Health - Occupational Stress  Questionnaire    Feeling of Stress : Not at all  Social Connections: Moderately Isolated (12/29/2022)   Social Connection and Isolation Panel [NHANES]    Frequency of Communication with Friends and Family: More than three times a week    Frequency of Social Gatherings with Friends and Family: Three times a week    Attends Religious Services: 1 to 4 times per year    Active Member of Clubs or Organizations: No    Attends Banker Meetings: Never    Marital Status: Never married  Intimate Partner Violence: Not At Risk (02/09/2023)   Humiliation, Afraid, Rape, and Kick questionnaire    Fear of Current or Ex-Partner: No    Emotionally Abused: No    Physically Abused: No    Sexually Abused: No    Past Surgical History:  Procedure Laterality Date   A/V FISTULAGRAM Left 08/27/2022   Procedure: A/V Fistulagram;  Surgeon: Marea Selinda RAMAN, MD;  Location: [facility];  Service: Cardiovascular;  Laterality: Left;   AMPUTATION Right 03/13/2022   Procedure: AMPUTATION ABOVE KNEE;  Surgeon: Marea Selinda RAMAN, MD;  Location: [facility];  Service: General;  Laterality: Right;   APPLICATION OF WOUND VAC Right 03/07/2022   Procedure: APPLICATION OF WOUND VAC;  Surgeon: Tisa Curry LABOR, MD;  Location: [facility];  Service: Vascular;  Laterality: Right;  GAAC 89160   LOWER EXTREMITY ANGIOGRAPHY Left 02/15/2023   Procedure: Lower Extremity Angiography;  Surgeon: Marea Selinda RAMAN, MD;  Location: [facility];  Service: Cardiovascular;  Laterality: Left;   WOUND DEBRIDEMENT Left    WOUND DEBRIDEMENT Right 01/16/2022   Procedure: DEBRIDEMENT WOUND;  Surgeon: Jama Cordella MATSU, MD;  Location: [facility];  Service: Vascular;  Laterality: Right;  Wound vac placement   WOUND DEBRIDEMENT Right 03/07/2022   Procedure: DEBRIDEMENT WOUND RIGHT LOWER EXTREMITY;  Surgeon: Tisa Curry LABOR, MD;  Location: [facility];  Service: Vascular;  Laterality: Right;    Family History  Problem Relation Age of Onset    Heart failure Mother    Cirrhosis Father    Hypertension Daughter     Allergies  Allergen Reactions   Tramadol  Rash       Latest Ref Rng & Units 03/03/2023   10:33 AM 03/01/2023    8:52 AM  02/26/2023    1:44 PM  CBC  WBC 4.0 - 10.5 K/uL 5.6  6.0  5.8   Hemoglobin 13.0 - 17.0 g/dL 88.3  88.8  89.2   Hematocrit 39.0 - 52.0 % 36.1  33.1  32.3   Platelets 150 - 400 K/uL 212  201  171       CMP     Component Value Date/Time   NA 134 (L) 03/03/2023 1033   NA 135 08/11/2022 1336   K 4.7 03/03/2023 1033   CL 94 (L) 03/03/2023 1033   CO2 27 03/03/2023 1033   GLUCOSE 106 (H) 03/03/2023 1033   BUN 58 (H) 03/03/2023 1033   BUN 96 (HH) 08/11/2022 1336   CREATININE 11.37 (H) 03/03/2023 1033   CALCIUM  7.9 (L) 03/03/2023 1033   PROT 7.8 02/09/2023 0918   PROT 7.8 08/11/2022 1336   ALBUMIN  3.0 (L) 03/03/2023 1033   ALBUMIN  4.3 08/11/2022 1336   AST 13 (L) 02/09/2023 0918   ALT 8 02/09/2023 0918   ALKPHOS 67 02/09/2023 0918   BILITOT 0.4 02/09/2023 0918   BILITOT 0.8 08/11/2022 1336   EGFR 4 (L) 08/11/2022 1336   GFRNONAA 5 (L) 03/03/2023 1033     No results found.     Assessment & Plan:   1. ESRD (end stage renal disease) (HCC) (Primary) Currently functioning well.  Patient had good flow volume with no significant stricture at that site follow-up we will plan on 70-month follow-up as previously scheduled  2. Skin ulcer of right lower leg, limited to breakdown of skin (HCC) The wound itself appears to be granulating fairly well.  He is currently having this wound dressed by wound care.  Will have him return in the next 2 to 3 weeks for ABIs following his recent revascularization   Current Outpatient Medications on File Prior to Visit  Medication Sig Dispense Refill   acetaminophen  (TYLENOL ) 325 MG tablet Take 2 tablets (650 mg total) by mouth every 6 (six) hours as needed for mild pain (pain score 1-3), fever or headache (or Fever >/= 101).     amiodarone  (PACERONE ) 200  MG tablet Take 1 tablet (200 mg total) by mouth 2 (two) times daily. 180 tablet 4   aspirin  EC 81 MG tablet Take 1 tablet (81 mg total) by mouth daily. Swallow whole.     calcitRIOL  (ROCALTROL ) 0.5 MCG capsule Take 3 capsules (1.5 mcg total) by mouth every Monday, Wednesday, and Friday at 6 PM.     cinacalcet  (SENSIPAR ) 30 MG tablet Take 1 tablet (30 mg total) by mouth daily with breakfast. 30 tablet 0   Disposable Gloves (NITRILE EXAM GLOVES LARGE) MISC To use as needed for bathing patient. 100 each 12   ELIQUIS  2.5 MG TABS tablet Take 1 tablet (2.5 mg total) by mouth 2 (two) times daily. 180 tablet 4   hydrocerin (EUCERIN) CREA Apply 1 Application topically 2 (two) times daily. To foot 454 g 0   HYDROcodone -acetaminophen  (NORCO) 10-325 MG tablet Take 1 tablet by mouth 3 (three) times daily as needed. 10 tablet 0   iron  polysaccharides (NIFEREX) 150 MG capsule Take 1 capsule (150 mg total) by mouth daily.     leptospermum manuka honey (MEDIHONEY) PSTE paste Apply 1 Application topically daily. To wound on lower left leg. 15 mL 3   melatonin 5 MG TABS Take 0.5 tablets (2.5 mg total) by mouth at bedtime.     multivitamin (RENA-VIT) TABS tablet Take 1 tablet by mouth at bedtime.  30 tablet 0   pregabalin  (LYRICA ) 25 MG capsule Take 1 capsule (25 mg total) by mouth daily. 10 capsule 0   rosuvastatin  (CRESTOR ) 40 MG tablet Take 1 tablet (40 mg total) by mouth daily. 90 tablet 4   SANTYL  250 UNIT/GM ointment Apply 1 Application topically daily.     senna (SENOKOT) 8.6 MG TABS tablet Take 2 tablets (17.2 mg total) by mouth at bedtime. 60 tablet 0   sevelamer  carbonate (RENVELA ) 800 MG tablet Take 2,400 mg by mouth 3 (three) times daily.     traZODone  (DESYREL ) 100 MG tablet Take 1 tablet (100 mg total) by mouth at bedtime. 90 tablet 4   No current facility-administered medications on file prior to visit.    There are no Patient Instructions on file for this visit. No follow-ups on file.   Rebbie Lauricella E  Vasiliki Smaldone, NP

## 2023-03-31 DIAGNOSIS — R52 Pain, unspecified: Secondary | ICD-10-CM | POA: Diagnosis not present

## 2023-03-31 DIAGNOSIS — R519 Headache, unspecified: Secondary | ICD-10-CM | POA: Diagnosis not present

## 2023-03-31 DIAGNOSIS — N186 End stage renal disease: Secondary | ICD-10-CM | POA: Diagnosis not present

## 2023-03-31 DIAGNOSIS — D689 Coagulation defect, unspecified: Secondary | ICD-10-CM | POA: Diagnosis not present

## 2023-03-31 DIAGNOSIS — L299 Pruritus, unspecified: Secondary | ICD-10-CM | POA: Diagnosis not present

## 2023-03-31 DIAGNOSIS — Z992 Dependence on renal dialysis: Secondary | ICD-10-CM | POA: Diagnosis not present

## 2023-03-31 DIAGNOSIS — N2581 Secondary hyperparathyroidism of renal origin: Secondary | ICD-10-CM | POA: Diagnosis not present

## 2023-04-01 DIAGNOSIS — H5789 Other specified disorders of eye and adnexa: Secondary | ICD-10-CM | POA: Diagnosis not present

## 2023-04-01 DIAGNOSIS — R319 Hematuria, unspecified: Secondary | ICD-10-CM | POA: Diagnosis not present

## 2023-04-01 DIAGNOSIS — T1490XA Injury, unspecified, initial encounter: Secondary | ICD-10-CM | POA: Diagnosis not present

## 2023-04-02 DIAGNOSIS — R519 Headache, unspecified: Secondary | ICD-10-CM | POA: Diagnosis not present

## 2023-04-02 DIAGNOSIS — R52 Pain, unspecified: Secondary | ICD-10-CM | POA: Diagnosis not present

## 2023-04-02 DIAGNOSIS — N186 End stage renal disease: Secondary | ICD-10-CM | POA: Diagnosis not present

## 2023-04-02 DIAGNOSIS — I5022 Chronic systolic (congestive) heart failure: Secondary | ICD-10-CM | POA: Diagnosis not present

## 2023-04-02 DIAGNOSIS — D689 Coagulation defect, unspecified: Secondary | ICD-10-CM | POA: Diagnosis not present

## 2023-04-02 DIAGNOSIS — S78111A Complete traumatic amputation at level between right hip and knee, initial encounter: Secondary | ICD-10-CM | POA: Diagnosis not present

## 2023-04-02 DIAGNOSIS — L299 Pruritus, unspecified: Secondary | ICD-10-CM | POA: Diagnosis not present

## 2023-04-02 DIAGNOSIS — Z992 Dependence on renal dialysis: Secondary | ICD-10-CM | POA: Diagnosis not present

## 2023-04-02 DIAGNOSIS — N2581 Secondary hyperparathyroidism of renal origin: Secondary | ICD-10-CM | POA: Diagnosis not present

## 2023-04-05 DIAGNOSIS — N2581 Secondary hyperparathyroidism of renal origin: Secondary | ICD-10-CM | POA: Diagnosis not present

## 2023-04-05 DIAGNOSIS — L299 Pruritus, unspecified: Secondary | ICD-10-CM | POA: Diagnosis not present

## 2023-04-05 DIAGNOSIS — S78111A Complete traumatic amputation at level between right hip and knee, initial encounter: Secondary | ICD-10-CM | POA: Diagnosis not present

## 2023-04-05 DIAGNOSIS — R52 Pain, unspecified: Secondary | ICD-10-CM | POA: Diagnosis not present

## 2023-04-05 DIAGNOSIS — N186 End stage renal disease: Secondary | ICD-10-CM | POA: Diagnosis not present

## 2023-04-05 DIAGNOSIS — I5022 Chronic systolic (congestive) heart failure: Secondary | ICD-10-CM | POA: Diagnosis not present

## 2023-04-05 DIAGNOSIS — D689 Coagulation defect, unspecified: Secondary | ICD-10-CM | POA: Diagnosis not present

## 2023-04-05 DIAGNOSIS — E785 Hyperlipidemia, unspecified: Secondary | ICD-10-CM | POA: Diagnosis not present

## 2023-04-05 DIAGNOSIS — E1122 Type 2 diabetes mellitus with diabetic chronic kidney disease: Secondary | ICD-10-CM | POA: Diagnosis not present

## 2023-04-05 DIAGNOSIS — R519 Headache, unspecified: Secondary | ICD-10-CM | POA: Diagnosis not present

## 2023-04-05 DIAGNOSIS — L97929 Non-pressure chronic ulcer of unspecified part of left lower leg with unspecified severity: Secondary | ICD-10-CM | POA: Diagnosis not present

## 2023-04-05 DIAGNOSIS — E1169 Type 2 diabetes mellitus with other specified complication: Secondary | ICD-10-CM | POA: Diagnosis not present

## 2023-04-05 DIAGNOSIS — Z992 Dependence on renal dialysis: Secondary | ICD-10-CM | POA: Diagnosis not present

## 2023-04-06 ENCOUNTER — Other Ambulatory Visit (INDEPENDENT_AMBULATORY_CARE_PROVIDER_SITE_OTHER): Payer: Self-pay | Admitting: Vascular Surgery

## 2023-04-06 DIAGNOSIS — Z9889 Other specified postprocedural states: Secondary | ICD-10-CM

## 2023-04-07 DIAGNOSIS — R52 Pain, unspecified: Secondary | ICD-10-CM | POA: Diagnosis not present

## 2023-04-07 DIAGNOSIS — Z992 Dependence on renal dialysis: Secondary | ICD-10-CM | POA: Diagnosis not present

## 2023-04-07 DIAGNOSIS — N186 End stage renal disease: Secondary | ICD-10-CM | POA: Diagnosis not present

## 2023-04-07 DIAGNOSIS — L299 Pruritus, unspecified: Secondary | ICD-10-CM | POA: Diagnosis not present

## 2023-04-07 DIAGNOSIS — N2581 Secondary hyperparathyroidism of renal origin: Secondary | ICD-10-CM | POA: Diagnosis not present

## 2023-04-07 DIAGNOSIS — R519 Headache, unspecified: Secondary | ICD-10-CM | POA: Diagnosis not present

## 2023-04-07 DIAGNOSIS — D689 Coagulation defect, unspecified: Secondary | ICD-10-CM | POA: Diagnosis not present

## 2023-04-09 DIAGNOSIS — N186 End stage renal disease: Secondary | ICD-10-CM | POA: Diagnosis not present

## 2023-04-09 DIAGNOSIS — D689 Coagulation defect, unspecified: Secondary | ICD-10-CM | POA: Diagnosis not present

## 2023-04-09 DIAGNOSIS — R52 Pain, unspecified: Secondary | ICD-10-CM | POA: Diagnosis not present

## 2023-04-09 DIAGNOSIS — Z992 Dependence on renal dialysis: Secondary | ICD-10-CM | POA: Diagnosis not present

## 2023-04-09 DIAGNOSIS — R519 Headache, unspecified: Secondary | ICD-10-CM | POA: Diagnosis not present

## 2023-04-09 DIAGNOSIS — L299 Pruritus, unspecified: Secondary | ICD-10-CM | POA: Diagnosis not present

## 2023-04-09 DIAGNOSIS — N2581 Secondary hyperparathyroidism of renal origin: Secondary | ICD-10-CM | POA: Diagnosis not present

## 2023-04-12 DIAGNOSIS — N186 End stage renal disease: Secondary | ICD-10-CM | POA: Diagnosis not present

## 2023-04-12 DIAGNOSIS — R519 Headache, unspecified: Secondary | ICD-10-CM | POA: Diagnosis not present

## 2023-04-12 DIAGNOSIS — N2581 Secondary hyperparathyroidism of renal origin: Secondary | ICD-10-CM | POA: Diagnosis not present

## 2023-04-12 DIAGNOSIS — R52 Pain, unspecified: Secondary | ICD-10-CM | POA: Diagnosis not present

## 2023-04-12 DIAGNOSIS — D689 Coagulation defect, unspecified: Secondary | ICD-10-CM | POA: Diagnosis not present

## 2023-04-12 DIAGNOSIS — L299 Pruritus, unspecified: Secondary | ICD-10-CM | POA: Diagnosis not present

## 2023-04-12 DIAGNOSIS — Z992 Dependence on renal dialysis: Secondary | ICD-10-CM | POA: Diagnosis not present

## 2023-04-13 ENCOUNTER — Ambulatory Visit (INDEPENDENT_AMBULATORY_CARE_PROVIDER_SITE_OTHER): Payer: 59 | Admitting: Nurse Practitioner

## 2023-04-13 ENCOUNTER — Encounter (INDEPENDENT_AMBULATORY_CARE_PROVIDER_SITE_OTHER): Payer: Self-pay | Admitting: Nurse Practitioner

## 2023-04-13 ENCOUNTER — Ambulatory Visit (INDEPENDENT_AMBULATORY_CARE_PROVIDER_SITE_OTHER): Payer: 59

## 2023-04-13 ENCOUNTER — Ambulatory Visit: Payer: Self-pay

## 2023-04-13 VITALS — BP 103/66 | HR 78 | Resp 18 | Ht 75.0 in | Wt 222.0 lb

## 2023-04-13 DIAGNOSIS — I739 Peripheral vascular disease, unspecified: Secondary | ICD-10-CM

## 2023-04-13 DIAGNOSIS — L97911 Non-pressure chronic ulcer of unspecified part of right lower leg limited to breakdown of skin: Secondary | ICD-10-CM

## 2023-04-13 DIAGNOSIS — N186 End stage renal disease: Secondary | ICD-10-CM

## 2023-04-13 DIAGNOSIS — Z9889 Other specified postprocedural states: Secondary | ICD-10-CM

## 2023-04-13 DIAGNOSIS — E1169 Type 2 diabetes mellitus with other specified complication: Secondary | ICD-10-CM | POA: Diagnosis not present

## 2023-04-13 DIAGNOSIS — E785 Hyperlipidemia, unspecified: Secondary | ICD-10-CM | POA: Diagnosis not present

## 2023-04-13 NOTE — Telephone Encounter (Signed)
  Chief Complaint: urine looks like chocolate milk Symptoms: above Frequency: 1 week Pertinent Negatives: Patient denies fever, pain increased frequency. Disposition: [] ED /[] Urgent Care (no appt availability in office) / [x] Appointment(In office/virtual)/ []  [facility]/ [] Home Care/ [] Refused Recommended Disposition /[] [facility]/ []  Follow-up with PCP Additional Notes: Pt states that urine looks like chocolate milk, but denies any other s/s. Appt made for tomorrow afternoon. Pt has 2  other appts today. If possible could we work pt in for this afternoon?  Reason for Disposition  Blood in urine  (Exception: Could be normal menstrual bleeding.)  Answer Assessment - Initial Assessment Questions 1. COLOR of URINE: "Describe the color of the urine."  (e.g., tea-colored, pink, red, bloody) "Do you have blood clots in your urine?" (e.g., none, pea, grape, small coin)     Pink  - looks like chocolate milk 2. ONSET: "When did the bleeding start?"      1 week 3. EPISODES: "How many times has there been blood in the urine?" or "How many times today?"     Every time 4. PAIN with URINATION: "Is there any pain with passing your urine?" If Yes, ask: "How bad is the pain?"  (Scale 1-10; or mild, moderate, severe)    - MILD: Complains slightly about urination hurting.    - MODERATE: Interferes with normal activities.      - SEVERE: Excruciating, unwilling or unable to urinate because of the pain.      no 5. FEVER: "Do you have a fever?" If Yes, ask: "What is your temperature, how was it measured, and when did it start?"     no 6. ASSOCIATED SYMPTOMS: "Are you passing urine more frequently than usual?"     no 7. OTHER SYMPTOMS: "Do you have any other symptoms?" (e.g., back/flank pain, abdomen pain, vomiting)     no  Protocols used: Urine - Blood In-A-AH

## 2023-04-13 NOTE — Progress Notes (Signed)
Subjective:    [name], male    DOB: [birth year], 62 y.o.   MRN: [id number] Chief Complaint  Patient presents with   Follow-up    Encounter form: F/u 2-3 weeks  ABI    The patient returns today for evaluation of his left lower extremity postintervention.  He underwent intervention on 02/15/2023:   Procedure(s) Performed:             1.  Ultrasound guidance for vascular access right femoral artery             2.  Catheter placement into left SFA from right femoral approach             3.  Aortogram and selective left lower extremity angiogram             4.  Percutaneous transluminal angioplasty of left posterior tibial artery with 3 mm diameter by 10 cm length angioplasty balloon             5.  Percutaneous transluminal angioplasty of left peroneal artery with 2.5 mm diameter by 30 cm length angioplasty balloon and 2 mm diameter by 10 cm length angioplasty balloon             6.  StarClose closure device right femoral artery   He continues to have the wound and is currently being treated by wound care.  It appears to be slightly smaller than it was previously.  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  There is no role for intervention.  Today his noninvasive studies show 1.28 on ABI with a TBI of 1.06.  He has biphasic/triphasic waveforms in the posterior tibial and peroneal vessels.    Review of Systems  Skin:  Positive for wound.  Neurological:  Positive for weakness.  All other systems reviewed and are negative.      Objective:   Physical Exam Vitals reviewed.  HENT:     Head: Normocephalic.  Cardiovascular:     Rate and Rhythm: Normal rate.  Pulmonary:     Effort: Pulmonary effort is normal.  Musculoskeletal:     Right Lower Extremity: Right leg is amputated above knee.  Skin:    General: Skin is warm and dry.  Neurological:     Mental Status: [name] and oriented to person, place, and  time.  Psychiatric:        Mood and Affect: Mood normal.        Behavior: Behavior normal.        Thought Content: Thought content normal.        Judgment: Judgment normal.     BP 103/66   Pulse 78   Resp 18   Ht 6\' 3"  (1.905 m)   Wt 222 lb (100.7 kg)   BMI 27.75 kg/m   Past Medical History:  Diagnosis Date   Chronic kidney disease    Congestive heart failure (HCC)    Diabetes mellitus without complication (HCC)    Difficult intubation    Hyperlipidemia    Hypertension     Social History   Socioeconomic History   Marital status: Divorced    Spouse name: Dollie Mayse   Number of children: 4   Years of education: Not on file   Highest education level: Not on file  Occupational History   Not on file  Tobacco Use   Smoking status: Never   Smokeless tobacco: Never  Vaping Use  Vaping status: Never Used  Substance and Sexual Activity   Alcohol use: Not Currently   Drug use: Never   Sexual activity: Not Currently  Other Topics Concern   Not on file  Social History Narrative   Lives with daughter [name].     Social Drivers of Corporate investment banker Strain: Low Risk  (11/25/2021)   Overall Financial Resource Strain (CARDIA)    Difficulty of Paying Living Expenses: Not hard at all  Food Insecurity: No Food Insecurity (02/09/2023)   Hunger Vital Sign    Worried About Running Out of Food in the Last Year: Never true    Ran Out of Food in the Last Year: Never true  Transportation Needs: No Transportation Needs (02/09/2023)   PRAPARE - Administrator, Civil Service (Medical): No    Lack of Transportation (Non-Medical): No  Recent Concern: Transportation Needs - Unmet Transportation Needs (12/29/2022)   PRAPARE - Transportation    Lack of Transportation (Medical): Yes    Lack of Transportation (Non-Medical): Yes  Physical Activity: Insufficiently Active (11/25/2021)   Exercise Vital Sign    Days of Exercise per Week: 2 days    Minutes of  Exercise per Session: 60 min  Stress: No Stress Concern Present (11/25/2021)   [employer] of Occupational Health - Occupational Stress Questionnaire    Feeling of Stress : Not at all  Social Connections: Moderately Isolated (12/29/2022)   Social Connection and Isolation Panel [NHANES]    Frequency of Communication with Friends and Family: More than three times a week    Frequency of Social Gatherings with Friends and Family: Three times a week    Attends Religious Services: 1 to 4 times per year    Active Member of Clubs or Organizations: No    Attends Banker Meetings: Never    Marital Status: Never married  Intimate Partner Violence: Not At Risk (02/09/2023)   Humiliation, Afraid, Rape, and Kick questionnaire    Fear of Current or Ex-Partner: No    Emotionally Abused: No    Physically Abused: No    Sexually Abused: No    Past Surgical History:  Procedure Laterality Date   A/V FISTULAGRAM Left 08/27/2022   Procedure: A/V Fistulagram;  Surgeon: Annice Needy, MD;  Location: [facility];  Service: Cardiovascular;  Laterality: Left;   AMPUTATION Right 03/13/2022   Procedure: AMPUTATION ABOVE KNEE;  Surgeon: Annice Needy, MD;  Location: [facility];  Service: General;  Laterality: Right;   APPLICATION OF WOUND VAC Right 03/07/2022   Procedure: APPLICATION OF WOUND VAC;  Surgeon: Bertram Denver, MD;  Location: [facility];  Service: Vascular;  Laterality: Right;  GAAC [id number]   LOWER EXTREMITY ANGIOGRAPHY Left 02/15/2023   Procedure: Lower Extremity Angiography;  Surgeon: Annice Needy, MD;  Location: [facility];  Service: Cardiovascular;  Laterality: Left;   WOUND DEBRIDEMENT Left    WOUND DEBRIDEMENT Right 01/16/2022   Procedure: DEBRIDEMENT WOUND;  Surgeon: Renford Dills, MD;  Location: [facility];  Service: Vascular;  Laterality: Right;  Wound vac placement   WOUND DEBRIDEMENT Right 03/07/2022   Procedure: DEBRIDEMENT WOUND RIGHT LOWER EXTREMITY;   Surgeon: Bertram Denver, MD;  Location: [facility];  Service: Vascular;  Laterality: Right;    Family History  Problem Relation Age of Onset   Heart failure Mother    Cirrhosis Father    Hypertension Daughter     Allergies  Allergen Reactions  Tramadol Rash       Latest Ref Rng & Units 03/03/2023   10:33 AM 03/01/2023    8:52 AM 02/26/2023    1:44 PM  CBC  WBC 4.0 - 10.5 K/uL 5.6  6.0  5.8   Hemoglobin 13.0 - 17.0 g/dL 82.9  56.2  13.0   Hematocrit 39.0 - 52.0 % 36.1  33.1  32.3   Platelets 150 - 400 K/uL 212  201  171       CMP     Component Value Date/Time   NA 134 (L) 03/03/2023 1033   NA 135 08/11/2022 1336   K 4.7 03/03/2023 1033   CL 94 (L) 03/03/2023 1033   CO2 27 03/03/2023 1033   GLUCOSE 106 (H) 03/03/2023 1033   BUN 58 (H) 03/03/2023 1033   BUN 96 (HH) 08/11/2022 1336   CREATININE 11.37 (H) 03/03/2023 1033   CALCIUM 7.9 (L) 03/03/2023 1033   PROT 7.8 02/09/2023 0918   PROT 7.8 08/11/2022 1336   ALBUMIN 3.0 (L) 03/03/2023 1033   ALBUMIN 4.3 08/11/2022 1336   AST 13 (L) 02/09/2023 0918   ALT 8 02/09/2023 0918   ALKPHOS 67 02/09/2023 0918   BILITOT 0.4 02/09/2023 0918   BILITOT 0.8 08/11/2022 1336   EGFR 4 (L) 08/11/2022 1336   GFRNONAA 5 (L) 03/03/2023 1033     No results found.     Assessment & Plan:   1. ESRD (end stage renal disease) (HCC) (Primary) Currently functioning well.  Patient had good flow volume with no significant stricture at that site follow-up we will plan to follow-up at next visit  2. Skin ulcer of right lower leg, limited to breakdown of skin (HCC) Today noninvasive studies indicate he should have adequate perfusion for wound healing.  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  Currently no role for intervention.  He will continue to follow with wound care.  Will have her return in 3 months for noninvasive studies or sooner if issues arise.   3. Hyperlipidemia associated with  type 2 diabetes mellitus (HCC) Continue statin as ordered and reviewed, no changes at this time   Current Outpatient Medications on File Prior to Visit  Medication Sig Dispense Refill   acetaminophen (TYLENOL) 325 MG tablet Take 2 tablets (650 mg total) by mouth every 6 (six) hours as needed for mild pain (pain score 1-3), fever or headache (or Fever >/= 101).     amiodarone (PACERONE) 200 MG tablet Take 1 tablet (200 mg total) by mouth 2 (two) times daily. 180 tablet 4   aspirin EC 81 MG tablet Take 1 tablet (81 mg total) by mouth daily. Swallow whole.     calcitRIOL (ROCALTROL) 0.5 MCG capsule Take 3 capsules (1.5 mcg total) by mouth every Monday, Wednesday, and Friday at 6 PM.     cinacalcet (SENSIPAR) 30 MG tablet Take 1 tablet (30 mg total) by mouth daily with breakfast. 30 tablet 0   Disposable Gloves (NITRILE EXAM GLOVES LARGE) MISC To use as needed for bathing patient. 100 each 12   ELIQUIS 2.5 MG TABS tablet Take 1 tablet (2.5 mg total) by mouth 2 (two) times daily. 180 tablet 4   hydrocerin (EUCERIN) CREA Apply 1 Application topically 2 (two) times daily. To foot 454 g 0   iron polysaccharides (NIFEREX) 150 MG capsule Take 1 capsule (150 mg total) by mouth daily.     leptospermum manuka honey (MEDIHONEY) PSTE  paste Apply 1 Application topically daily. To wound on lower left leg. 15 mL 3   melatonin 5 MG TABS Take 0.5 tablets (2.5 mg total) by mouth at bedtime.     multivitamin (RENA-VIT) TABS tablet Take 1 tablet by mouth at bedtime. 30 tablet 0   pregabalin (LYRICA) 25 MG capsule Take 1 capsule (25 mg total) by mouth daily. 10 capsule 0   rosuvastatin (CRESTOR) 40 MG tablet Take 1 tablet (40 mg total) by mouth daily. 90 tablet 4   SANTYL 250 UNIT/GM ointment Apply 1 Application topically daily.     senna (SENOKOT) 8.6 MG TABS tablet Take 2 tablets (17.2 mg total) by mouth at bedtime. 60 tablet 0   sevelamer carbonate (RENVELA) 800 MG tablet Take 2,400 mg by mouth 3 (three) times  daily.     traZODone (DESYREL) 100 MG tablet Take 1 tablet (100 mg total) by mouth at bedtime. 90 tablet 4   HYDROcodone-acetaminophen (NORCO) 10-325 MG tablet Take 1 tablet by mouth 3 (three) times daily as needed. (Patient not taking: Reported on 04/13/2023) 10 tablet 0   No current facility-administered medications on file prior to visit.    There are no Patient Instructions on file for this visit. No follow-ups on file.   Georgiana Spinner, NP

## 2023-04-14 ENCOUNTER — Encounter: Payer: Self-pay | Admitting: Pediatrics

## 2023-04-14 ENCOUNTER — Ambulatory Visit: Payer: 59 | Admitting: Pediatrics

## 2023-04-14 VITALS — BP 82/53 | HR 83 | Temp 98.4°F | Resp 16

## 2023-04-14 DIAGNOSIS — R820 Chyluria: Secondary | ICD-10-CM

## 2023-04-14 DIAGNOSIS — N186 End stage renal disease: Secondary | ICD-10-CM

## 2023-04-14 DIAGNOSIS — L299 Pruritus, unspecified: Secondary | ICD-10-CM | POA: Diagnosis not present

## 2023-04-14 DIAGNOSIS — R52 Pain, unspecified: Secondary | ICD-10-CM | POA: Diagnosis not present

## 2023-04-14 DIAGNOSIS — Z1330 Encounter for screening examination for mental health and behavioral disorders, unspecified: Secondary | ICD-10-CM

## 2023-04-14 DIAGNOSIS — D689 Coagulation defect, unspecified: Secondary | ICD-10-CM | POA: Diagnosis not present

## 2023-04-14 DIAGNOSIS — Z992 Dependence on renal dialysis: Secondary | ICD-10-CM | POA: Diagnosis not present

## 2023-04-14 DIAGNOSIS — R519 Headache, unspecified: Secondary | ICD-10-CM | POA: Diagnosis not present

## 2023-04-14 DIAGNOSIS — I959 Hypotension, unspecified: Secondary | ICD-10-CM

## 2023-04-14 DIAGNOSIS — N2581 Secondary hyperparathyroidism of renal origin: Secondary | ICD-10-CM | POA: Diagnosis not present

## 2023-04-14 LAB — VAS US ABI WITH/WO TBI: Left ABI: 1.28

## 2023-04-14 NOTE — Patient Instructions (Addendum)
Please schedule follow up with your nephrology team We will start with some urine and lab tests  I suspect this is just due to progression of your kidney disease vs an infection but we will call you with next steps.

## 2023-04-14 NOTE — Progress Notes (Signed)
Office Visit  BP (!) 82/53 (BP Location: Left Arm, Patient Position: Sitting, Cuff Size: Large)   Pulse 83   Temp 98.4 F (36.9 C) (Oral)   Resp 16   SpO2 100%    Subjective:    [name], male    DOB: Dec 13, 1960, 63 y.o.   MRN: [id number]  HPI: Adam Howell is a 63 y.o. male  Chief Complaint  Patient presents with   Urine Output    States since last week has been milky color. No other symptoms.     Discussed the use of AI scribe software for clinical note transcription with the patient, who gave verbal consent to proceed.  History of Present Illness   The patient, with chronic kidney disease, presents with changes in urine appearance and volume.  Over the past week, Adam Howell has noticed a 'milky' appearance in his urine, described as similar to 'white milk', along with decreased urine volume at times. Despite these changes, Adam Howell continues to produce urine. There are no associated symptoms such as rashes, hematuria, back pain, fevers, or chills.  Adam Howell has a history of chronic kidney disease but has never been on dialysis. There is no history of nephrolithiasis.  His blood pressure is typically low, but Adam Howell has not experienced increased lightheadedness.     Relevant past medical, surgical, family and social history reviewed and updated as indicated. Interim medical history since our last visit reviewed. Allergies and medications reviewed and updated.  ROS per HPI unless specifically indicated above     Objective:    BP (!) 82/53 (BP Location: Left Arm, Patient Position: Sitting, Cuff Size: Large)   Pulse 83   Temp 98.4 F (36.9 C) (Oral)   Resp 16   SpO2 100%   Wt Readings from Last 3 Encounters:  04/13/23 222 lb (100.7 kg)  03/30/23 222 lb (100.7 kg)  03/03/23 223 lb 1.7 oz (101.2 kg)     Physical Exam Constitutional:      Appearance: Normal appearance.  Pulmonary:     Effort: Pulmonary effort is normal.  Abdominal:     General: Abdomen  is flat.     Palpations: Abdomen is soft.  Musculoskeletal:        General: Normal range of motion.  Skin:    Comments: Normal skin color  Neurological:     General: No focal deficit present.     Mental Status: Adam Howell is alert. Mental status is at baseline.  Psychiatric:        Mood and Affect: Mood normal.        Behavior: Behavior normal.        Thought Content: Thought content normal.         04/14/2023    3:50 PM 12/29/2022   12:15 PM 05/12/2022    1:55 PM 04/13/2022    3:28 PM 11/25/2021    2:34 PM  Depression screen PHQ 2/9  Decreased Interest 0 0 0 0 0  Down, Depressed, Hopeless 0 0 0 0 0  PHQ - 2 Score 0 0 0 0 0  Altered sleeping 0  3 3 0  Tired, decreased energy 0  0 0 0  Change in appetite 0  0 0 0  Feeling bad or failure about yourself  0  0 0 0  Trouble concentrating 0  0 0 0  Moving slowly or fidgety/restless 0  0 0 0  Suicidal thoughts 0  0 0 0  PHQ-9 Score 0  3 3 0  Difficult doing work/chores Not difficult at all  Not difficult at all Not difficult at all Not difficult at all       04/14/2023    3:51 PM 05/12/2022    1:56 PM 04/13/2022    3:28 PM  GAD 7 : Generalized Anxiety Score  Nervous, Anxious, on Edge 0 0 0  Control/stop worrying 0 0 0  Worry too much - different things 0 0 0  Trouble relaxing 0 0 3  Restless 0 0 0  Easily annoyed or irritable 0 0 0  Afraid - awful might happen 0 0 0  Total GAD 7 Score 0 0 3  Anxiety Difficulty Not difficult at all  Not difficult at all       Assessment & Plan:  Assessment & Plan   Milky urine ESRD on dialysis [facility]) Noted change in urine color and volume over the past week. No symptoms of infection or kidney stones. No history of dialysis. Suspect chyluria from protein spilling. Recently transitioned to dialysis. Does not have a sample for today but will get U/A to confirm. Adam Howell does see nephrology for dialysis so encouraged him to bring this up at next session. -Collect urine sample for analysis to rule out  infection. -Order blood work to assess kidney function. -Coordinate follow-up with nephrologist. -     Renal function panel -     Urinalysis, Routine w reflex microscopic -     Urine Culture  Hypotension Noted today. Adam Howell is asymptomatic. Suspect has lower baseline due to ESRD/dialysis. CTM. Pt given strict return precautions and anticipatory guidance. -Monitor blood pressure.  Encounter for behavioral health screening As part of their intake evaluation, the patient was screened for depression, anxiety.  PHQ9 SCORE 0, GAD7 SCORE 0. Screening results negative for tested conditions. CTM.   Follow up plan: Return if symptoms worsen or fail to improve.  Juandaniel Manfredo Howell Pringle, MD

## 2023-04-15 DIAGNOSIS — N390 Urinary tract infection, site not specified: Secondary | ICD-10-CM | POA: Diagnosis not present

## 2023-04-15 LAB — RENAL FUNCTION PANEL
Albumin: 4.3 g/dL (ref 3.9–4.9)
BUN/Creatinine Ratio: 3 — ABNORMAL LOW (ref 10–24)
BUN: 18 mg/dL (ref 8–27)
CO2: 27 mmol/L (ref 20–29)
Calcium: 8.3 mg/dL — ABNORMAL LOW (ref 8.6–10.2)
Chloride: 90 mmol/L — ABNORMAL LOW (ref 96–106)
Creatinine, Ser: 5.5 mg/dL — ABNORMAL HIGH (ref 0.76–1.27)
Glucose: 119 mg/dL — ABNORMAL HIGH (ref 70–99)
Phosphorus: 3 mg/dL (ref 2.8–4.1)
Potassium: 4.6 mmol/L (ref 3.5–5.2)
Sodium: 136 mmol/L (ref 134–144)
eGFR: 11 mL/min/{1.73_m2} — ABNORMAL LOW (ref >59)

## 2023-04-16 ENCOUNTER — Telehealth: Payer: Self-pay

## 2023-04-16 DIAGNOSIS — N186 End stage renal disease: Secondary | ICD-10-CM | POA: Diagnosis not present

## 2023-04-16 DIAGNOSIS — N2581 Secondary hyperparathyroidism of renal origin: Secondary | ICD-10-CM | POA: Diagnosis not present

## 2023-04-16 DIAGNOSIS — D689 Coagulation defect, unspecified: Secondary | ICD-10-CM | POA: Diagnosis not present

## 2023-04-16 DIAGNOSIS — I120 Hypertensive chronic kidney disease with stage 5 chronic kidney disease or end stage renal disease: Secondary | ICD-10-CM | POA: Diagnosis not present

## 2023-04-16 DIAGNOSIS — Z992 Dependence on renal dialysis: Secondary | ICD-10-CM | POA: Diagnosis not present

## 2023-04-16 DIAGNOSIS — R519 Headache, unspecified: Secondary | ICD-10-CM | POA: Diagnosis not present

## 2023-04-16 DIAGNOSIS — R52 Pain, unspecified: Secondary | ICD-10-CM | POA: Diagnosis not present

## 2023-04-16 DIAGNOSIS — L299 Pruritus, unspecified: Secondary | ICD-10-CM | POA: Diagnosis not present

## 2023-04-16 NOTE — Telephone Encounter (Signed)
-----   Message from [place] P SANTOS sent at 04/16/2023  1:01 PM EST ----- Please call patient and let him know kidney and electrolyte levels look ok. Can you ask if he had a chance to drop off the urine? If not, did he have a chance to talk to his nephrologist this week about the urine issue? If not, will make more recommendations once we have the urine back. Thanks!

## 2023-04-16 NOTE — Telephone Encounter (Signed)
Patient was called to see if he's spoken to his nephrologist and to see when he will be available for a urine sample. Patient stated that he's not able to urinate so he will provide a sample when he can. Stated he has not seen nephrologist.

## 2023-04-16 NOTE — Progress Notes (Signed)
Called patient to relay message regarding urine per Dr. Evelene Croon. Patient stated he has not seen nephrologist and will bring in a sample of urine when he is able.

## 2023-04-17 DIAGNOSIS — R4189 Other symptoms and signs involving cognitive functions and awareness: Secondary | ICD-10-CM | POA: Diagnosis not present

## 2023-04-17 DIAGNOSIS — M6281 Muscle weakness (generalized): Secondary | ICD-10-CM | POA: Diagnosis not present

## 2023-04-17 DIAGNOSIS — R1310 Dysphagia, unspecified: Secondary | ICD-10-CM | POA: Diagnosis not present

## 2023-04-17 DIAGNOSIS — N2581 Secondary hyperparathyroidism of renal origin: Secondary | ICD-10-CM | POA: Diagnosis not present

## 2023-04-17 DIAGNOSIS — E1122 Type 2 diabetes mellitus with diabetic chronic kidney disease: Secondary | ICD-10-CM | POA: Diagnosis not present

## 2023-04-17 DIAGNOSIS — R2681 Unsteadiness on feet: Secondary | ICD-10-CM | POA: Diagnosis not present

## 2023-04-17 DIAGNOSIS — R488 Other symbolic dysfunctions: Secondary | ICD-10-CM | POA: Diagnosis not present

## 2023-04-17 DIAGNOSIS — E119 Type 2 diabetes mellitus without complications: Secondary | ICD-10-CM | POA: Diagnosis not present

## 2023-04-17 DIAGNOSIS — N186 End stage renal disease: Secondary | ICD-10-CM | POA: Diagnosis not present

## 2023-04-17 DIAGNOSIS — E1169 Type 2 diabetes mellitus with other specified complication: Secondary | ICD-10-CM | POA: Diagnosis not present

## 2023-04-17 DIAGNOSIS — L03116 Cellulitis of left lower limb: Secondary | ICD-10-CM | POA: Diagnosis not present

## 2023-04-17 DIAGNOSIS — E785 Hyperlipidemia, unspecified: Secondary | ICD-10-CM | POA: Diagnosis not present

## 2023-04-17 DIAGNOSIS — R41841 Cognitive communication deficit: Secondary | ICD-10-CM | POA: Diagnosis not present

## 2023-04-17 DIAGNOSIS — L089 Local infection of the skin and subcutaneous tissue, unspecified: Secondary | ICD-10-CM | POA: Diagnosis not present

## 2023-04-17 DIAGNOSIS — Z992 Dependence on renal dialysis: Secondary | ICD-10-CM | POA: Diagnosis not present

## 2023-04-17 DIAGNOSIS — L97929 Non-pressure chronic ulcer of unspecified part of left lower leg with unspecified severity: Secondary | ICD-10-CM | POA: Diagnosis not present

## 2023-04-19 DIAGNOSIS — E1122 Type 2 diabetes mellitus with diabetic chronic kidney disease: Secondary | ICD-10-CM | POA: Diagnosis not present

## 2023-04-19 DIAGNOSIS — Z992 Dependence on renal dialysis: Secondary | ICD-10-CM | POA: Diagnosis not present

## 2023-04-19 DIAGNOSIS — R41841 Cognitive communication deficit: Secondary | ICD-10-CM | POA: Diagnosis not present

## 2023-04-19 DIAGNOSIS — N2581 Secondary hyperparathyroidism of renal origin: Secondary | ICD-10-CM | POA: Diagnosis not present

## 2023-04-19 DIAGNOSIS — N186 End stage renal disease: Secondary | ICD-10-CM | POA: Diagnosis not present

## 2023-04-19 DIAGNOSIS — E1169 Type 2 diabetes mellitus with other specified complication: Secondary | ICD-10-CM | POA: Diagnosis not present

## 2023-04-19 DIAGNOSIS — E785 Hyperlipidemia, unspecified: Secondary | ICD-10-CM | POA: Diagnosis not present

## 2023-04-19 DIAGNOSIS — L97929 Non-pressure chronic ulcer of unspecified part of left lower leg with unspecified severity: Secondary | ICD-10-CM | POA: Diagnosis not present

## 2023-04-20 DIAGNOSIS — N186 End stage renal disease: Secondary | ICD-10-CM | POA: Diagnosis not present

## 2023-04-20 DIAGNOSIS — R4189 Other symptoms and signs involving cognitive functions and awareness: Secondary | ICD-10-CM | POA: Diagnosis not present

## 2023-04-20 DIAGNOSIS — L03116 Cellulitis of left lower limb: Secondary | ICD-10-CM | POA: Diagnosis not present

## 2023-04-20 DIAGNOSIS — E119 Type 2 diabetes mellitus without complications: Secondary | ICD-10-CM | POA: Diagnosis not present

## 2023-04-21 ENCOUNTER — Ambulatory Visit: Payer: Medicare HMO | Attending: Internal Medicine | Admitting: Internal Medicine

## 2023-04-21 DIAGNOSIS — N2581 Secondary hyperparathyroidism of renal origin: Secondary | ICD-10-CM | POA: Diagnosis not present

## 2023-04-21 DIAGNOSIS — Z992 Dependence on renal dialysis: Secondary | ICD-10-CM | POA: Diagnosis not present

## 2023-04-21 DIAGNOSIS — R41841 Cognitive communication deficit: Secondary | ICD-10-CM | POA: Diagnosis not present

## 2023-04-21 DIAGNOSIS — N186 End stage renal disease: Secondary | ICD-10-CM | POA: Diagnosis not present

## 2023-04-21 NOTE — Progress Notes (Deleted)
  Cardiology Office Note:  .   Date:  04/21/2023  ID:  Adam Howell, DOB December 19, 1960, MRN [id number] PCP: Valerio Melanie DASEN, NP  [facility] Providers Cardiologist:  Lonni Hanson, MD { Click to update primary MD,subspecialty MD or APP then REFRESH:1}    History of Present Illness: .   Adam Howell is a 63 y.o. male with history of HFrEF, hypertension, hyperlipidemia, type 2 diabetes mellitus, and Malic Rosten-stage renal disease, who has been referred for evaluation of heart failure.  I met him once in 10/2019 for cardiology evaluation prior to prescribing of PDE 5 therapy for erectile dysfunction.  He was feeling well at that time and was unsure as to why he was on chronic anticoagulation.  Subsequent echo showed LVEF of 35-40% with severe LVH.  He was lost to follow-up.  ROS: See HPI  Studies Reviewed: SABRA        TTE (01/10/2020): Normal LV size with severe LVH.  LVEF 35-40% with global hypokinesis.  Normal RV size and wall thickness.  Low normal RV function.  Mild pulmonary hypertension (RVSP 38 mmHg).  Moderate left atrial enlargement.  Mild right atrial enlargement.  No pericardial effusion.  No significant valvular abnormality.  Normal CVP.  Risk Assessment/Calculations:   {Does this patient have ATRIAL FIBRILLATION?[phone]} No BP recorded.  {Refresh Note OR Click here to enter BP  :1}***       Physical Exam:   VS:  There were no vitals taken for this visit.   Wt Readings from Last 3 Encounters:  04/13/23 222 lb (100.7 kg)  03/30/23 222 lb (100.7 kg)  03/03/23 223 lb 1.7 oz (101.2 kg)    General:  NAD. Neck: No JVD or HJR. Lungs: Clear to auscultation bilaterally without wheezes or crackles. Heart: Regular rate and rhythm without murmurs, rubs, or gallops. Abdomen: Soft, nontender, nondistended. Extremities: No lower extremity edema.  ASSESSMENT AND PLAN: .    ***    {Are you ordering a CV Procedure (e.g. stress test, cath, DCCV, TEE, etc)?   Press F2         :[phone]}  Dispo: ***  Signed, Lonni Hanson, MD

## 2023-04-23 DIAGNOSIS — N2581 Secondary hyperparathyroidism of renal origin: Secondary | ICD-10-CM | POA: Diagnosis not present

## 2023-04-23 DIAGNOSIS — Z992 Dependence on renal dialysis: Secondary | ICD-10-CM | POA: Diagnosis not present

## 2023-04-23 DIAGNOSIS — N186 End stage renal disease: Secondary | ICD-10-CM | POA: Diagnosis not present

## 2023-04-26 DIAGNOSIS — L97929 Non-pressure chronic ulcer of unspecified part of left lower leg with unspecified severity: Secondary | ICD-10-CM | POA: Diagnosis not present

## 2023-04-26 DIAGNOSIS — E1169 Type 2 diabetes mellitus with other specified complication: Secondary | ICD-10-CM | POA: Diagnosis not present

## 2023-04-26 DIAGNOSIS — E1122 Type 2 diabetes mellitus with diabetic chronic kidney disease: Secondary | ICD-10-CM | POA: Diagnosis not present

## 2023-04-26 DIAGNOSIS — N2581 Secondary hyperparathyroidism of renal origin: Secondary | ICD-10-CM | POA: Diagnosis not present

## 2023-04-26 DIAGNOSIS — N186 End stage renal disease: Secondary | ICD-10-CM | POA: Diagnosis not present

## 2023-04-26 DIAGNOSIS — E785 Hyperlipidemia, unspecified: Secondary | ICD-10-CM | POA: Diagnosis not present

## 2023-04-26 DIAGNOSIS — Z992 Dependence on renal dialysis: Secondary | ICD-10-CM | POA: Diagnosis not present

## 2023-04-26 DIAGNOSIS — R41841 Cognitive communication deficit: Secondary | ICD-10-CM | POA: Diagnosis not present

## 2023-04-27 ENCOUNTER — Telehealth (INDEPENDENT_AMBULATORY_CARE_PROVIDER_SITE_OTHER): Payer: Self-pay

## 2023-04-27 NOTE — Telephone Encounter (Signed)
Spoke with the provider. She stated that the patient has a scab and they are afraid it will rupture. He has a aneurysm under scab and they have been cannulating away from the area going upward.

## 2023-04-27 NOTE — Telephone Encounter (Addendum)
Dr. Cassie Freer called to verify that an assessment was done by our vascular office Pateint last seen her 03/16/23 Seen by Vivia Birmingham on 03/30/23 and 04/13/23  Procedure on 04/13/23

## 2023-04-27 NOTE — Telephone Encounter (Signed)
An assessment on what? I saw him for his leg and arterial studies but I didn't see him for his fistula.  He denied any issues when I talked to him.  What are her exact concerns?

## 2023-04-27 NOTE — Telephone Encounter (Signed)
He should come in, within the next week or so with HDA to see me or JD

## 2023-04-28 DIAGNOSIS — Z992 Dependence on renal dialysis: Secondary | ICD-10-CM | POA: Diagnosis not present

## 2023-04-28 DIAGNOSIS — R41841 Cognitive communication deficit: Secondary | ICD-10-CM | POA: Diagnosis not present

## 2023-04-28 DIAGNOSIS — N2581 Secondary hyperparathyroidism of renal origin: Secondary | ICD-10-CM | POA: Diagnosis not present

## 2023-04-28 DIAGNOSIS — N186 End stage renal disease: Secondary | ICD-10-CM | POA: Diagnosis not present

## 2023-04-30 DIAGNOSIS — N2581 Secondary hyperparathyroidism of renal origin: Secondary | ICD-10-CM | POA: Diagnosis not present

## 2023-04-30 DIAGNOSIS — Z992 Dependence on renal dialysis: Secondary | ICD-10-CM | POA: Diagnosis not present

## 2023-04-30 DIAGNOSIS — N186 End stage renal disease: Secondary | ICD-10-CM | POA: Diagnosis not present

## 2023-05-03 DIAGNOSIS — N186 End stage renal disease: Secondary | ICD-10-CM | POA: Diagnosis not present

## 2023-05-03 DIAGNOSIS — Z992 Dependence on renal dialysis: Secondary | ICD-10-CM | POA: Diagnosis not present

## 2023-05-03 DIAGNOSIS — N2581 Secondary hyperparathyroidism of renal origin: Secondary | ICD-10-CM | POA: Diagnosis not present

## 2023-05-03 DIAGNOSIS — R41841 Cognitive communication deficit: Secondary | ICD-10-CM | POA: Diagnosis not present

## 2023-05-05 DIAGNOSIS — N186 End stage renal disease: Secondary | ICD-10-CM | POA: Diagnosis not present

## 2023-05-05 DIAGNOSIS — Z992 Dependence on renal dialysis: Secondary | ICD-10-CM | POA: Diagnosis not present

## 2023-05-05 DIAGNOSIS — R41841 Cognitive communication deficit: Secondary | ICD-10-CM | POA: Diagnosis not present

## 2023-05-05 DIAGNOSIS — N2581 Secondary hyperparathyroidism of renal origin: Secondary | ICD-10-CM | POA: Diagnosis not present

## 2023-05-07 DIAGNOSIS — Z992 Dependence on renal dialysis: Secondary | ICD-10-CM | POA: Diagnosis not present

## 2023-05-07 DIAGNOSIS — N2581 Secondary hyperparathyroidism of renal origin: Secondary | ICD-10-CM | POA: Diagnosis not present

## 2023-05-07 DIAGNOSIS — N186 End stage renal disease: Secondary | ICD-10-CM | POA: Diagnosis not present

## 2023-05-09 DIAGNOSIS — R41841 Cognitive communication deficit: Secondary | ICD-10-CM | POA: Diagnosis not present

## 2023-05-09 DIAGNOSIS — R1310 Dysphagia, unspecified: Secondary | ICD-10-CM | POA: Diagnosis not present

## 2023-05-09 DIAGNOSIS — L03116 Cellulitis of left lower limb: Secondary | ICD-10-CM | POA: Diagnosis not present

## 2023-05-09 DIAGNOSIS — R488 Other symbolic dysfunctions: Secondary | ICD-10-CM | POA: Diagnosis not present

## 2023-05-09 DIAGNOSIS — R2681 Unsteadiness on feet: Secondary | ICD-10-CM | POA: Diagnosis not present

## 2023-05-09 DIAGNOSIS — M6281 Muscle weakness (generalized): Secondary | ICD-10-CM | POA: Diagnosis not present

## 2023-05-10 DIAGNOSIS — R1310 Dysphagia, unspecified: Secondary | ICD-10-CM | POA: Diagnosis not present

## 2023-05-10 DIAGNOSIS — E1122 Type 2 diabetes mellitus with diabetic chronic kidney disease: Secondary | ICD-10-CM | POA: Diagnosis not present

## 2023-05-10 DIAGNOSIS — M6281 Muscle weakness (generalized): Secondary | ICD-10-CM | POA: Diagnosis not present

## 2023-05-10 DIAGNOSIS — E1169 Type 2 diabetes mellitus with other specified complication: Secondary | ICD-10-CM | POA: Diagnosis not present

## 2023-05-10 DIAGNOSIS — N2581 Secondary hyperparathyroidism of renal origin: Secondary | ICD-10-CM | POA: Diagnosis not present

## 2023-05-10 DIAGNOSIS — L03116 Cellulitis of left lower limb: Secondary | ICD-10-CM | POA: Diagnosis not present

## 2023-05-10 DIAGNOSIS — L97929 Non-pressure chronic ulcer of unspecified part of left lower leg with unspecified severity: Secondary | ICD-10-CM | POA: Diagnosis not present

## 2023-05-10 DIAGNOSIS — Z992 Dependence on renal dialysis: Secondary | ICD-10-CM | POA: Diagnosis not present

## 2023-05-10 DIAGNOSIS — N186 End stage renal disease: Secondary | ICD-10-CM | POA: Diagnosis not present

## 2023-05-10 DIAGNOSIS — E785 Hyperlipidemia, unspecified: Secondary | ICD-10-CM | POA: Diagnosis not present

## 2023-05-10 DIAGNOSIS — R41841 Cognitive communication deficit: Secondary | ICD-10-CM | POA: Diagnosis not present

## 2023-05-10 DIAGNOSIS — R488 Other symbolic dysfunctions: Secondary | ICD-10-CM | POA: Diagnosis not present

## 2023-05-10 DIAGNOSIS — R2681 Unsteadiness on feet: Secondary | ICD-10-CM | POA: Diagnosis not present

## 2023-05-11 DIAGNOSIS — R2681 Unsteadiness on feet: Secondary | ICD-10-CM | POA: Diagnosis not present

## 2023-05-11 DIAGNOSIS — R1310 Dysphagia, unspecified: Secondary | ICD-10-CM | POA: Diagnosis not present

## 2023-05-11 DIAGNOSIS — L03116 Cellulitis of left lower limb: Secondary | ICD-10-CM | POA: Diagnosis not present

## 2023-05-11 DIAGNOSIS — R488 Other symbolic dysfunctions: Secondary | ICD-10-CM | POA: Diagnosis not present

## 2023-05-11 DIAGNOSIS — M6281 Muscle weakness (generalized): Secondary | ICD-10-CM | POA: Diagnosis not present

## 2023-05-11 DIAGNOSIS — R41841 Cognitive communication deficit: Secondary | ICD-10-CM | POA: Diagnosis not present

## 2023-05-12 DIAGNOSIS — M6281 Muscle weakness (generalized): Secondary | ICD-10-CM | POA: Diagnosis not present

## 2023-05-12 DIAGNOSIS — I10 Essential (primary) hypertension: Secondary | ICD-10-CM | POA: Diagnosis not present

## 2023-05-12 DIAGNOSIS — N186 End stage renal disease: Secondary | ICD-10-CM | POA: Diagnosis not present

## 2023-05-12 DIAGNOSIS — R488 Other symbolic dysfunctions: Secondary | ICD-10-CM | POA: Diagnosis not present

## 2023-05-12 DIAGNOSIS — R41841 Cognitive communication deficit: Secondary | ICD-10-CM | POA: Diagnosis not present

## 2023-05-12 DIAGNOSIS — Z992 Dependence on renal dialysis: Secondary | ICD-10-CM | POA: Diagnosis not present

## 2023-05-12 DIAGNOSIS — N2581 Secondary hyperparathyroidism of renal origin: Secondary | ICD-10-CM | POA: Diagnosis not present

## 2023-05-12 DIAGNOSIS — R2681 Unsteadiness on feet: Secondary | ICD-10-CM | POA: Diagnosis not present

## 2023-05-12 DIAGNOSIS — L03116 Cellulitis of left lower limb: Secondary | ICD-10-CM | POA: Diagnosis not present

## 2023-05-12 DIAGNOSIS — R1310 Dysphagia, unspecified: Secondary | ICD-10-CM | POA: Diagnosis not present

## 2023-05-13 DIAGNOSIS — R41841 Cognitive communication deficit: Secondary | ICD-10-CM | POA: Diagnosis not present

## 2023-05-13 DIAGNOSIS — R488 Other symbolic dysfunctions: Secondary | ICD-10-CM | POA: Diagnosis not present

## 2023-05-13 DIAGNOSIS — L03116 Cellulitis of left lower limb: Secondary | ICD-10-CM | POA: Diagnosis not present

## 2023-05-13 DIAGNOSIS — M6281 Muscle weakness (generalized): Secondary | ICD-10-CM | POA: Diagnosis not present

## 2023-05-13 DIAGNOSIS — R1310 Dysphagia, unspecified: Secondary | ICD-10-CM | POA: Diagnosis not present

## 2023-05-13 DIAGNOSIS — R2681 Unsteadiness on feet: Secondary | ICD-10-CM | POA: Diagnosis not present

## 2023-05-14 DIAGNOSIS — I120 Hypertensive chronic kidney disease with stage 5 chronic kidney disease or end stage renal disease: Secondary | ICD-10-CM | POA: Diagnosis not present

## 2023-05-14 DIAGNOSIS — R1310 Dysphagia, unspecified: Secondary | ICD-10-CM | POA: Diagnosis not present

## 2023-05-14 DIAGNOSIS — N2581 Secondary hyperparathyroidism of renal origin: Secondary | ICD-10-CM | POA: Diagnosis not present

## 2023-05-14 DIAGNOSIS — L03116 Cellulitis of left lower limb: Secondary | ICD-10-CM | POA: Diagnosis not present

## 2023-05-14 DIAGNOSIS — M6281 Muscle weakness (generalized): Secondary | ICD-10-CM | POA: Diagnosis not present

## 2023-05-14 DIAGNOSIS — R2681 Unsteadiness on feet: Secondary | ICD-10-CM | POA: Diagnosis not present

## 2023-05-14 DIAGNOSIS — R41841 Cognitive communication deficit: Secondary | ICD-10-CM | POA: Diagnosis not present

## 2023-05-14 DIAGNOSIS — R488 Other symbolic dysfunctions: Secondary | ICD-10-CM | POA: Diagnosis not present

## 2023-05-14 DIAGNOSIS — Z992 Dependence on renal dialysis: Secondary | ICD-10-CM | POA: Diagnosis not present

## 2023-05-14 DIAGNOSIS — N186 End stage renal disease: Secondary | ICD-10-CM | POA: Diagnosis not present

## 2023-05-15 DIAGNOSIS — R1310 Dysphagia, unspecified: Secondary | ICD-10-CM | POA: Diagnosis not present

## 2023-05-15 DIAGNOSIS — R2681 Unsteadiness on feet: Secondary | ICD-10-CM | POA: Diagnosis not present

## 2023-05-15 DIAGNOSIS — L03116 Cellulitis of left lower limb: Secondary | ICD-10-CM | POA: Diagnosis not present

## 2023-05-15 DIAGNOSIS — R41841 Cognitive communication deficit: Secondary | ICD-10-CM | POA: Diagnosis not present

## 2023-05-15 DIAGNOSIS — M6281 Muscle weakness (generalized): Secondary | ICD-10-CM | POA: Diagnosis not present

## 2023-05-15 DIAGNOSIS — R488 Other symbolic dysfunctions: Secondary | ICD-10-CM | POA: Diagnosis not present

## 2023-05-16 DIAGNOSIS — R2681 Unsteadiness on feet: Secondary | ICD-10-CM | POA: Diagnosis not present

## 2023-05-16 DIAGNOSIS — M6281 Muscle weakness (generalized): Secondary | ICD-10-CM | POA: Diagnosis not present

## 2023-05-16 DIAGNOSIS — R488 Other symbolic dysfunctions: Secondary | ICD-10-CM | POA: Diagnosis not present

## 2023-05-16 DIAGNOSIS — L03116 Cellulitis of left lower limb: Secondary | ICD-10-CM | POA: Diagnosis not present

## 2023-05-16 DIAGNOSIS — R1310 Dysphagia, unspecified: Secondary | ICD-10-CM | POA: Diagnosis not present

## 2023-05-16 DIAGNOSIS — R41841 Cognitive communication deficit: Secondary | ICD-10-CM | POA: Diagnosis not present

## 2023-05-17 DIAGNOSIS — R488 Other symbolic dysfunctions: Secondary | ICD-10-CM | POA: Diagnosis not present

## 2023-05-17 DIAGNOSIS — L03116 Cellulitis of left lower limb: Secondary | ICD-10-CM | POA: Diagnosis not present

## 2023-05-17 DIAGNOSIS — R1310 Dysphagia, unspecified: Secondary | ICD-10-CM | POA: Diagnosis not present

## 2023-05-17 DIAGNOSIS — R41841 Cognitive communication deficit: Secondary | ICD-10-CM | POA: Diagnosis not present

## 2023-05-17 DIAGNOSIS — R2681 Unsteadiness on feet: Secondary | ICD-10-CM | POA: Diagnosis not present

## 2023-05-17 DIAGNOSIS — M6281 Muscle weakness (generalized): Secondary | ICD-10-CM | POA: Diagnosis not present

## 2023-05-18 DIAGNOSIS — R1310 Dysphagia, unspecified: Secondary | ICD-10-CM | POA: Diagnosis not present

## 2023-05-18 DIAGNOSIS — R488 Other symbolic dysfunctions: Secondary | ICD-10-CM | POA: Diagnosis not present

## 2023-05-18 DIAGNOSIS — L03116 Cellulitis of left lower limb: Secondary | ICD-10-CM | POA: Diagnosis not present

## 2023-05-18 DIAGNOSIS — R2681 Unsteadiness on feet: Secondary | ICD-10-CM | POA: Diagnosis not present

## 2023-05-18 DIAGNOSIS — R41841 Cognitive communication deficit: Secondary | ICD-10-CM | POA: Diagnosis not present

## 2023-05-18 DIAGNOSIS — M6281 Muscle weakness (generalized): Secondary | ICD-10-CM | POA: Diagnosis not present

## 2023-05-25 ENCOUNTER — Other Ambulatory Visit (HOSPITAL_COMMUNITY): Payer: Self-pay

## 2023-05-25 ENCOUNTER — Telehealth: Payer: Self-pay

## 2023-05-25 DIAGNOSIS — Z89611 Acquired absence of right leg above knee: Secondary | ICD-10-CM

## 2023-05-25 NOTE — Telephone Encounter (Signed)
 Is this something we can do or does the referral team need to get involved?

## 2023-05-25 NOTE — Telephone Encounter (Signed)
 Copied from CRM [id number]. Topic: Clinical - Medical Advice >> May 24, 2023  3:29 PM Shon Hale wrote: Reason for CRM: Patient requesting additional time with home health. Requesting PCP to send authorization to insurance so that additional time will be covered.   Can reach out to clinical intake team: [phone]

## 2023-05-28 ENCOUNTER — Ambulatory Visit: Payer: Medicare HMO | Admitting: Urology

## 2023-06-28 ENCOUNTER — Other Ambulatory Visit (INDEPENDENT_AMBULATORY_CARE_PROVIDER_SITE_OTHER): Payer: Self-pay | Admitting: Vascular Surgery

## 2023-06-28 DIAGNOSIS — Z9889 Other specified postprocedural states: Secondary | ICD-10-CM

## 2023-06-28 DIAGNOSIS — N186 End stage renal disease: Secondary | ICD-10-CM

## 2023-06-29 ENCOUNTER — Ambulatory Visit (INDEPENDENT_AMBULATORY_CARE_PROVIDER_SITE_OTHER): Admitting: Vascular Surgery

## 2023-06-29 ENCOUNTER — Ambulatory Visit (INDEPENDENT_AMBULATORY_CARE_PROVIDER_SITE_OTHER)

## 2023-06-29 ENCOUNTER — Telehealth (INDEPENDENT_AMBULATORY_CARE_PROVIDER_SITE_OTHER): Payer: Self-pay

## 2023-06-29 ENCOUNTER — Encounter (INDEPENDENT_AMBULATORY_CARE_PROVIDER_SITE_OTHER): Payer: Self-pay | Admitting: Vascular Surgery

## 2023-06-29 VITALS — BP 119/74 | HR 53 | Resp 16

## 2023-06-29 DIAGNOSIS — I739 Peripheral vascular disease, unspecified: Secondary | ICD-10-CM

## 2023-06-29 DIAGNOSIS — E785 Hyperlipidemia, unspecified: Secondary | ICD-10-CM

## 2023-06-29 DIAGNOSIS — I10 Essential (primary) hypertension: Secondary | ICD-10-CM | POA: Diagnosis not present

## 2023-06-29 DIAGNOSIS — Z9889 Other specified postprocedural states: Secondary | ICD-10-CM | POA: Diagnosis not present

## 2023-06-29 DIAGNOSIS — E1122 Type 2 diabetes mellitus with diabetic chronic kidney disease: Secondary | ICD-10-CM

## 2023-06-29 DIAGNOSIS — Z89611 Acquired absence of right leg above knee: Secondary | ICD-10-CM

## 2023-06-29 DIAGNOSIS — E1169 Type 2 diabetes mellitus with other specified complication: Secondary | ICD-10-CM | POA: Diagnosis not present

## 2023-06-29 DIAGNOSIS — Z992 Dependence on renal dialysis: Secondary | ICD-10-CM

## 2023-06-29 DIAGNOSIS — N186 End stage renal disease: Secondary | ICD-10-CM | POA: Diagnosis not present

## 2023-06-29 NOTE — Assessment & Plan Note (Signed)
 lipid control important in reducing the progression of atherosclerotic disease. Continue statin therapy

## 2023-06-29 NOTE — Assessment & Plan Note (Signed)
 ABIs today show a left ABI of 1.07 with triphasic waveforms and good digital pressures.  Right AKA is well-healed.  Recheck left ABI in 1 year.

## 2023-06-29 NOTE — Assessment & Plan Note (Signed)
 Well-healed

## 2023-06-29 NOTE — Assessment & Plan Note (Signed)
 blood glucose control important in reducing the progression of atherosclerotic disease. Also, involved in wound healing. On appropriate medications.

## 2023-06-29 NOTE — Telephone Encounter (Signed)
 I attempted to contact the patient to schedule him for a left arm AVF jump graft with Dr. Vonna Guardian. A message was left for a return call.

## 2023-06-29 NOTE — Progress Notes (Signed)
 MRN : [id number]  Adam Howell is a 63 y.o. (1960/09/22) male who presents with chief complaint of  Chief Complaint  Patient presents with   Follow-up    3 month abi and HDA  .  History of Present Illness: Patient returns today in follow up of multiple vascular issues.  We follow him for his dialysis access as well as his peripheral arterial disease.  Status post right above-knee amputation previously.  No current issues with rest pain or ulceration on the left leg.  ABIs today show a left ABI of 1.07 with triphasic waveforms and good digital pressures. In terms of his dialysis access, he says this has not been doing very well.  They have been having issues with some skin breakdown and scabs.  He has not really had prolonged bleeding or low flow rates. Duplex today shows a stenosis in the mid forearm outflow vein.  The fistula is quite aneurysmal with scabs and impending skin threat putting it at high risk for bleeding.   Current Outpatient Medications  Medication Sig Dispense Refill   acetaminophen (TYLENOL) 325 MG tablet Take 2 tablets (650 mg total) by mouth every 6 (six) hours as needed for mild pain (pain score 1-3), fever or headache (or Fever >/= 101).     amiodarone (PACERONE) 200 MG tablet Take 1 tablet (200 mg total) by mouth 2 (two) times daily. 180 tablet 4   aspirin EC 81 MG tablet Take 1 tablet (81 mg total) by mouth daily. Swallow whole.     calcitRIOL (ROCALTROL) 0.5 MCG capsule Take 3 capsules (1.5 mcg total) by mouth every Monday, Wednesday, and Friday at 6 PM.     cinacalcet (SENSIPAR) 30 MG tablet Take 1 tablet (30 mg total) by mouth daily with breakfast. 30 tablet 0   Disposable Gloves (NITRILE EXAM GLOVES LARGE) MISC To use as needed for bathing patient. 100 each 12   ELIQUIS 2.5 MG TABS tablet Take 1 tablet (2.5 mg total) by mouth 2 (two) times daily. 180 tablet 4   hydrocerin (EUCERIN) CREA Apply 1 Application topically 2 (two) times daily. To foot 454 g 0    HYDROcodone-acetaminophen (NORCO) 10-325 MG tablet Take 1 tablet by mouth 3 (three) times daily as needed. 10 tablet 0   iron polysaccharides (NIFEREX) 150 MG capsule Take 1 capsule (150 mg total) by mouth daily.     leptospermum manuka honey (MEDIHONEY) PSTE paste Apply 1 Application topically daily. To wound on lower left leg. 15 mL 3   melatonin 5 MG TABS Take 0.5 tablets (2.5 mg total) by mouth at bedtime.     multivitamin (RENA-VIT) TABS tablet Take 1 tablet by mouth at bedtime. 30 tablet 0   pregabalin (LYRICA) 25 MG capsule Take 1 capsule (25 mg total) by mouth daily. 10 capsule 0   rosuvastatin (CRESTOR) 40 MG tablet Take 1 tablet (40 mg total) by mouth daily. 90 tablet 4   SANTYL 250 UNIT/GM ointment Apply 1 Application topically daily.     senna (SENOKOT) 8.6 MG TABS tablet Take 2 tablets (17.2 mg total) by mouth at bedtime. 60 tablet 0   sevelamer carbonate (RENVELA) 800 MG tablet Take 2,400 mg by mouth 3 (three) times daily.     traZODone (DESYREL) 100 MG tablet Take 1 tablet (100 mg total) by mouth at bedtime. 90 tablet 4   No current facility-administered medications for this visit.    Past Medical History:  Diagnosis Date   Chronic kidney disease  Congestive heart failure (HCC)    Diabetes mellitus without complication (HCC)    Difficult intubation    Hyperlipidemia    Hypertension     Past Surgical History:  Procedure Laterality Date   A/V FISTULAGRAM Left 08/27/2022   Procedure: A/V Fistulagram;  Surgeon: Annice Needy, MD;  Location: [facility] INVASIVE CV [facility];  Service: Cardiovascular;  Laterality: Left;   AMPUTATION Right 03/13/2022   Procedure: AMPUTATION ABOVE KNEE;  Surgeon: Annice Needy, MD;  Location: [facility] ORS;  Service: General;  Laterality: Right;   APPLICATION OF WOUND VAC Right 03/07/2022   Procedure: APPLICATION OF WOUND VAC;  Surgeon: Bertram Denver, MD;  Location: [facility] ORS;  Service: Vascular;  Laterality: Right;  GAAC [id number]   LOWER EXTREMITY ANGIOGRAPHY  Left 02/15/2023   Procedure: Lower Extremity Angiography;  Surgeon: Annice Needy, MD;  Location: [facility] IN[facility] CV [facility];  Service: Cardiovascular;  Laterality: Left;   WOUND DEBRIDEMENT Left    WOUND DEBRIDEMENT Right 01/16/2022   Procedure: DEBRIDEMENT WOUND;  Surgeon: Renford Dills, MD;  Location: [facility] ORS;  Service: Vascular;  Laterality: Right;  Wound vac placement   WOUND DEBRIDEMENT Right 03/07/2022   Procedure: DEBRIDEMENT WOUND RIGHT LOWER EXTREMITY;  Surgeon: Bertram Denver, MD;  Location: [facility] ORS;  Service: Vascular;  Laterality: Right;     Social History   Tobacco Use   Smoking status: Never   Smokeless tobacco: Never  Vaping Use   Vaping status: Never Used  Substance Use Topics   Alcohol use: Not Currently   Drug use: Never       Family History  Problem Relation Age of Onset   Heart failure Mother    Cirrhosis Father    Hypertension Daughter      Allergies  Allergen Reactions   Tramadol Rash     REVIEW OF SYSTEMS (Negative unless checked)  Constitutional: [] Weight loss  [] Fever  [] Chills Cardiac: [] Chest pain   [] Chest pressure   [] Palpitations   [] Shortness of breath when laying flat   [] Shortness of breath at rest   [] Shortness of breath with exertion. Vascular:  [] Pain in legs with walking   [] Pain in legs at rest   [] Pain in legs when laying flat   [] Claudication   [] Pain in feet when walking  [] Pain in feet at rest  [] Pain in feet when laying flat   [] History of DVT   [] Phlebitis   [] Swelling in legs   [] Varicose veins   [] Non-healing ulcers Pulmonary:   [] Uses home oxygen   [] Productive cough   [] Hemoptysis   [] Wheeze  [] COPD   [] Asthma Neurologic:  [] Dizziness  [] Blackouts   [] Seizures   [] History of stroke   [] History of TIA  [] Aphasia   [] Temporary blindness   [] Dysphagia   [] Weakness or numbness in arms   [] Weakness or numbness in legs Musculoskeletal:  [] Arthritis   [] Joint swelling   [x] Joint pain   [] Low back pain Hematologic:  [] Easy bruising   [] Easy bleeding   [] Hypercoagulable state   [x] Anemic   Gastrointestinal:  [] Blood in stool   [] Vomiting blood  [] Gastroesophageal reflux/heartburn   [] Abdominal pain Genitourinary:  [x] Chronic kidney disease   [] Difficult urination  [] Frequent urination  [] Burning with urination   [] Hematuria Skin:  [] Rashes   [x] Ulcers   [x] Wounds Psychological:  [] History of anxiety   []  History of major depression.  Physical Examination  BP 119/74   Pulse (!) 53   Resp 16  Gen:  WD/WN, NAD Head: [place]/AT,  No temporalis wasting. Ear/Nose/Throat: Hearing grossly intact, nares w/o erythema or drainage Eyes: Conjunctiva clear. Sclera non-icteric Neck: Supple.  Trachea midline Pulmonary:  Good air movement, no use of accessory muscles.  Cardiac: RRR, no JVD Vascular: Left radiocephalic AV fistula with marked aneurysmal degeneration near the arterial access site.  There is scabbing and impending skin breakdown with very thin skin.  Thrill is present. Vessel Right Left  Radial Palpable Palpable                          PT Not Palpable Palpable  DP Not Palpable Palpable   Gastrointestinal: soft, non-tender/non-distended. No guarding/reflex.  Musculoskeletal: M/S 5/5 throughout.  No deformity or atrophy. Right AKA, no left leg edema. Neurologic: Sensation grossly intact in extremities.  Symmetrical.  Speech is fluent.  Psychiatric: Judgment intact, Mood & affect appropriate for pt's clinical situation. Dermatologic: No rashes or ulcers noted.  No cellulitis or open wounds.      Labs Recent Results (from the past 2160 hours)  VAS [place] ABI WITH/WO TBI     Status: None   Collection Time: 04/13/23  9:58 AM  Result Value Ref Range   Right ABI AKA    Left ABI 1.28   Renal Function Panel     Status: Abnormal   Collection Time: 04/14/23  4:10 PM  Result Value Ref Range   Glucose 119 (H) 70 - 99 mg/dL   BUN 18 8 - 27 mg/dL   Creatinine, Ser 1.61 (H) 0.76 - 1.27 mg/dL   eGFR 11 (L) >09 UE/AVW/0.98    BUN/Creatinine Ratio 3 (L) 10 - 24   Sodium 136 134 - 144 mmol/L   Potassium 4.6 3.5 - 5.2 mmol/L   Chloride 90 (L) 96 - 106 mmol/L   CO2 27 20 - 29 mmol/L   Calcium 8.3 (L) 8.6 - 10.2 mg/dL   Phosphorus 3.0 2.8 - 4.1 mg/dL   Albumin 4.3 3.9 - 4.9 g/dL    Radiology No results found.  Assessment/Plan  Peripheral vascular disease (HCC) ABIs today show a left ABI of 1.07 with triphasic waveforms and good digital pressures.  Right AKA is well-healed.  Recheck left ABI in 1 year.  Hypertension blood pressure control important in reducing the progression of atherosclerotic disease. On appropriate oral medications.   Type 2 diabetes mellitus with ESRD (end-stage renal disease) (HCC) blood glucose control important in reducing the progression of atherosclerotic disease. Also, involved in wound healing. On appropriate medications.   Hyperlipidemia associated with type 2 diabetes mellitus (HCC) lipid control important in reducing the progression of atherosclerotic disease. Continue statin therapy   S/P AKA (above knee amputation), right (HCC) Well healed.  ESRD on dialysis [facility]) Duplex today shows a stenosis in the mid forearm outflow vein.  The fistula is quite aneurysmal with scabs and impending skin threat putting it at high risk for bleeding.  This needs a surgical revision.  I would plan on doing a jump graft of this access.  He will need a PermCath for few weeks while this heals.  Will place the PermCath first and then plan on placing a jump graft surgically shortly thereafter.  I discussed the risks and benefits of this procedure and the PermCath placement with the patient and he is agreeable to proceed.    Festus Barren, MD  06/29/2023 11:11 AM    This note was created with Dragon medical transcription system.  Any errors from dictation are purely unintentional

## 2023-06-29 NOTE — Assessment & Plan Note (Signed)
 blood pressure control important in reducing the progression of atherosclerotic disease. On appropriate oral medications.

## 2023-06-29 NOTE — Assessment & Plan Note (Signed)
 Duplex today shows a stenosis in the mid forearm outflow vein.  The fistula is quite aneurysmal with scabs and impending skin threat putting it at high risk for bleeding.  This needs a surgical revision.  I would plan on doing a jump graft of this access.  He will need a PermCath for few weeks while this heals.  Will place the PermCath first and then plan on placing a jump graft surgically shortly thereafter.  I discussed the risks and benefits of this procedure and the PermCath placement with the patient and he is agreeable to proceed.

## 2023-06-30 LAB — VAS US ABI WITH/WO TBI: Left ABI: 1.07

## 2023-07-12 ENCOUNTER — Telehealth (INDEPENDENT_AMBULATORY_CARE_PROVIDER_SITE_OTHER): Payer: Self-pay

## 2023-07-12 NOTE — Telephone Encounter (Signed)
 I attempted to contact the patient to schedule a left arm AVF with Dr. Vonna Guardian. A message was left for a return call.

## 2023-07-13 ENCOUNTER — Encounter (INDEPENDENT_AMBULATORY_CARE_PROVIDER_SITE_OTHER): Payer: 59

## 2023-07-13 ENCOUNTER — Ambulatory Visit (INDEPENDENT_AMBULATORY_CARE_PROVIDER_SITE_OTHER): Payer: 59 | Admitting: Vascular Surgery

## 2023-07-15 ENCOUNTER — Ambulatory Visit (INDEPENDENT_AMBULATORY_CARE_PROVIDER_SITE_OTHER): Admitting: Urology

## 2023-07-15 VITALS — BP 131/82 | HR 69 | Ht 72.0 in | Wt 225.0 lb

## 2023-07-15 DIAGNOSIS — R820 Chyluria: Secondary | ICD-10-CM

## 2023-07-15 DIAGNOSIS — R310 Gross hematuria: Secondary | ICD-10-CM

## 2023-07-15 DIAGNOSIS — R34 Anuria and oliguria: Secondary | ICD-10-CM

## 2023-07-15 NOTE — Progress Notes (Signed)
 I, Adam Howell, acting as a scribe for Adam Knapp, MD., have documented all relevant documentation on the behalf of Adam Knapp, MD, as directed by Adam Knapp, MD while in the presence of Adam Knapp, MD.  07/15/2023 3:33 PM   Adam Howell 21-Dec-1960 [id number]  Referring provider: Lemar Pyles, NP [address] [place],  [place] [address]  Chief Complaint  Patient presents with   Hematuria    HPI: Adam Howell is a 63 y.o. male referred for hematuria.  Office visit with PCP 04/14/23, patient was complaining of milky appearing urine. He has end-stage renal disease on hemodialysis. He is oligarchic and estimates he voids 1-2 times per day with only a small volume of urine.  He has had no dysuria and denies gross hematuria. When he saw his PCP, he could not give a urine sample and could not give a urine sample today. He resides at an SNF who sent a consult for hematuria. Though there is no documentation, the patient denies gross hematuria.  Denies flank, abdominal, or pelvic pain.    PMH: Past Medical History:  Diagnosis Date   Chronic kidney disease    Congestive heart failure (HCC)    Diabetes mellitus without complication (HCC)    Difficult intubation    Hyperlipidemia    Hypertension     Surgical History: Past Surgical History:  Procedure Laterality Date   A/V FISTULAGRAM Left 08/27/2022   Procedure: A/V Fistulagram;  Surgeon: Celso College, MD;  Location: [facility];  Service: Cardiovascular;  Laterality: Left;   AMPUTATION Right 03/13/2022   Procedure: AMPUTATION ABOVE KNEE;  Surgeon: Celso College, MD;  Location: [facility] OR[facility];  Service: General;  Laterality: Right;   APPLICATION OF WOUND VAC Right 03/07/2022   Procedure: APPLICATION OF WOUND VAC;  Surgeon: Lesta Rater, MD;  Location: [facility] ORS;  Service: Vascular;  Laterality: Right;  GAAC [id number]   LOWER EXTREMITY ANGIOGRAPHY Left 02/15/2023   Procedure: Lower Extremity  Angiography;  Surgeon: Celso College, MD;  Location: [facility];  Service: Cardiovascular;  Laterality: Left;   WOUND DEBRIDEMENT Left    WOUND DEBRIDEMENT Right 01/16/2022   Procedure: DEBRIDEMENT WOUND;  Surgeon: Jackquelyn Mass, MD;  Location: [facility];  Service: Vascular;  Laterality: Right;  Wound vac placement   WOUND DEBRIDEMENT Right 03/07/2022   Procedure: DEBRIDEMENT WOUND RIGHT LOWER EXTREMITY;  Surgeon: Lesta Rater, MD;  Location: [facility];  Service: Vascular;  Laterality: Right;    Home Medications:  Allergies as of 07/15/2023       Reactions   Tramadol  Rash        Medication List        Accurate as of Jul 15, 2023  3:33 PM. If you have any questions, ask your nurse or doctor.          acetaminophen  325 MG tablet Commonly known as: TYLENOL  Take 2 tablets (650 mg total) by mouth every 6 (six) hours as needed for mild pain (pain score 1-3), fever or headache (or Fever >/= 101).   amiodarone  200 MG tablet Commonly known as: PACERONE  Take 1 tablet (200 mg total) by mouth 2 (two) times daily.   aspirin  EC 81 MG tablet Take 1 tablet (81 mg total) by mouth daily. Swallow whole.   calcitRIOL  0.5 MCG capsule Commonly known as: ROCALTROL  Take 3 capsules (1.5 mcg total) by mouth every Monday, Wednesday, and Friday at  6 PM.   carvedilol  6.25 MG tablet Commonly known as: COREG  Take by mouth.   cinacalcet  30 MG tablet Commonly known as: SENSIPAR  Take 1 tablet (30 mg total) by mouth daily with breakfast.   Eliquis  2.5 MG Tabs tablet Generic drug: apixaban  Take 1 tablet (2.5 mg total) by mouth 2 (two) times daily.   hydrocerin Crea Apply 1 Application topically 2 (two) times daily. To foot   HYDROcodone -acetaminophen  10-325 MG tablet Commonly known as: NORCO Take 1 tablet by mouth 3 (three) times daily as needed.   iron  polysaccharides 150 MG capsule Commonly known as: NIFEREX Take 1 capsule (150 mg total) by mouth daily.   leptospermum manuka  honey Pste paste Apply 1 Application topically daily. To wound on lower left leg.   melatonin 5 MG Tabs Take 0.5 tablets (2.5 mg total) by mouth at bedtime.   multivitamin Tabs tablet Take 1 tablet by mouth at bedtime.   Nitrile Exam Gloves Large Misc To use as needed for bathing patient.   pregabalin  25 MG capsule Commonly known as: Lyrica  Take 1 capsule (25 mg total) by mouth daily.   rosuvastatin  40 MG tablet Commonly known as: CRESTOR  Take 1 tablet (40 mg total) by mouth daily.   Santyl  250 UNIT/GM ointment Generic drug: collagenase  Apply 1 Application topically daily.   senna 8.6 MG Tabs tablet Commonly known as: SENOKOT Take 2 tablets (17.2 mg total) by mouth at bedtime.   sevelamer  carbonate 800 MG tablet Commonly known as: RENVELA  Take 2,400 mg by mouth 3 (three) times daily.   traZODone  100 MG tablet Commonly known as: DESYREL  Take 1 tablet (100 mg total) by mouth at bedtime.        Allergies:  Allergies  Allergen Reactions   Tramadol  Rash    Family History: Family History  Problem Relation Age of Onset   Heart failure Mother    Cirrhosis Father    Hypertension Daughter     Social History:  reports that he has never smoked. He has never used smokeless tobacco. He reports that he does not currently use alcohol. He reports that he does not use drugs.   Physical Exam: BP 131/82   Pulse 69   Ht 6' (1.829 m)   Wt 225 lb (102.1 kg)   BMI 30.52 kg/m   Constitutional:  Alert and oriented, No acute distress. HEENT: [place] AT, moist mucus membranes.  Trachea midline, no masses. Cardiovascular: No clubbing, cyanosis, or edema. Respiratory: Normal respiratory effort, no increased work of breathing. GI: Abdomen is soft, nontender, nondistended, no abdominal masses Skin: No rashes, bruises or suspicious lesions. Neurologic: Grossly intact, no focal deficits, moving all 4 extremities. Psychiatric: Normal mood and affect.   Assessment & Plan:     63 year old male with a history of milky-appearing urine, which has resolved.  No documented microhematuria and he denies gross hematuria. He was provided a urine specimen cup, and when he is able to obtain a sample, a urinalysis will be performed.  Further recommendations pending review of his UA.  [facility] [address] [place], [place] [address] [phone]

## 2023-07-15 NOTE — Patient Instructions (Addendum)
 Patient to call and scheduled a lab drop off, for urina sample

## 2023-07-16 DIAGNOSIS — N2581 Secondary hyperparathyroidism of renal origin: Secondary | ICD-10-CM | POA: Diagnosis not present

## 2023-07-16 DIAGNOSIS — R52 Pain, unspecified: Secondary | ICD-10-CM | POA: Diagnosis not present

## 2023-07-16 DIAGNOSIS — D689 Coagulation defect, unspecified: Secondary | ICD-10-CM | POA: Diagnosis not present

## 2023-07-16 DIAGNOSIS — N186 End stage renal disease: Secondary | ICD-10-CM | POA: Diagnosis not present

## 2023-07-16 DIAGNOSIS — Z992 Dependence on renal dialysis: Secondary | ICD-10-CM | POA: Diagnosis not present

## 2023-07-16 DIAGNOSIS — L299 Pruritus, unspecified: Secondary | ICD-10-CM | POA: Diagnosis not present

## 2023-07-18 ENCOUNTER — Encounter: Payer: Self-pay | Admitting: Urology

## 2023-07-19 DIAGNOSIS — D689 Coagulation defect, unspecified: Secondary | ICD-10-CM | POA: Diagnosis not present

## 2023-07-19 DIAGNOSIS — L299 Pruritus, unspecified: Secondary | ICD-10-CM | POA: Diagnosis not present

## 2023-07-19 DIAGNOSIS — E1169 Type 2 diabetes mellitus with other specified complication: Secondary | ICD-10-CM | POA: Diagnosis not present

## 2023-07-19 DIAGNOSIS — N2581 Secondary hyperparathyroidism of renal origin: Secondary | ICD-10-CM | POA: Diagnosis not present

## 2023-07-19 DIAGNOSIS — L97528 Non-pressure chronic ulcer of other part of left foot with other specified severity: Secondary | ICD-10-CM | POA: Diagnosis not present

## 2023-07-19 DIAGNOSIS — E11621 Type 2 diabetes mellitus with foot ulcer: Secondary | ICD-10-CM | POA: Diagnosis not present

## 2023-07-19 DIAGNOSIS — N186 End stage renal disease: Secondary | ICD-10-CM | POA: Diagnosis not present

## 2023-07-19 DIAGNOSIS — Z992 Dependence on renal dialysis: Secondary | ICD-10-CM | POA: Diagnosis not present

## 2023-07-19 DIAGNOSIS — R52 Pain, unspecified: Secondary | ICD-10-CM | POA: Diagnosis not present

## 2023-07-19 DIAGNOSIS — E1122 Type 2 diabetes mellitus with diabetic chronic kidney disease: Secondary | ICD-10-CM | POA: Diagnosis not present

## 2023-07-20 DIAGNOSIS — T1490XA Injury, unspecified, initial encounter: Secondary | ICD-10-CM | POA: Diagnosis not present

## 2023-07-20 DIAGNOSIS — G629 Polyneuropathy, unspecified: Secondary | ICD-10-CM | POA: Diagnosis not present

## 2023-07-20 DIAGNOSIS — Z760 Encounter for issue of repeat prescription: Secondary | ICD-10-CM | POA: Diagnosis not present

## 2023-07-20 DIAGNOSIS — E119 Type 2 diabetes mellitus without complications: Secondary | ICD-10-CM | POA: Diagnosis not present

## 2023-07-20 DIAGNOSIS — G8929 Other chronic pain: Secondary | ICD-10-CM | POA: Diagnosis not present

## 2023-07-21 DIAGNOSIS — L299 Pruritus, unspecified: Secondary | ICD-10-CM | POA: Diagnosis not present

## 2023-07-21 DIAGNOSIS — E119 Type 2 diabetes mellitus without complications: Secondary | ICD-10-CM | POA: Diagnosis not present

## 2023-07-21 DIAGNOSIS — R52 Pain, unspecified: Secondary | ICD-10-CM | POA: Diagnosis not present

## 2023-07-21 DIAGNOSIS — N186 End stage renal disease: Secondary | ICD-10-CM | POA: Diagnosis not present

## 2023-07-21 DIAGNOSIS — Z992 Dependence on renal dialysis: Secondary | ICD-10-CM | POA: Diagnosis not present

## 2023-07-21 DIAGNOSIS — D689 Coagulation defect, unspecified: Secondary | ICD-10-CM | POA: Diagnosis not present

## 2023-07-21 DIAGNOSIS — N2581 Secondary hyperparathyroidism of renal origin: Secondary | ICD-10-CM | POA: Diagnosis not present

## 2023-07-21 DIAGNOSIS — I5020 Unspecified systolic (congestive) heart failure: Secondary | ICD-10-CM | POA: Diagnosis not present

## 2023-07-23 DIAGNOSIS — Z992 Dependence on renal dialysis: Secondary | ICD-10-CM | POA: Diagnosis not present

## 2023-07-23 DIAGNOSIS — N186 End stage renal disease: Secondary | ICD-10-CM | POA: Diagnosis not present

## 2023-07-23 DIAGNOSIS — L299 Pruritus, unspecified: Secondary | ICD-10-CM | POA: Diagnosis not present

## 2023-07-23 DIAGNOSIS — N2581 Secondary hyperparathyroidism of renal origin: Secondary | ICD-10-CM | POA: Diagnosis not present

## 2023-07-23 DIAGNOSIS — D689 Coagulation defect, unspecified: Secondary | ICD-10-CM | POA: Diagnosis not present

## 2023-07-23 DIAGNOSIS — R52 Pain, unspecified: Secondary | ICD-10-CM | POA: Diagnosis not present

## 2023-07-26 ENCOUNTER — Other Ambulatory Visit: Payer: Self-pay

## 2023-07-26 DIAGNOSIS — N186 End stage renal disease: Secondary | ICD-10-CM | POA: Diagnosis not present

## 2023-07-26 DIAGNOSIS — R52 Pain, unspecified: Secondary | ICD-10-CM | POA: Diagnosis not present

## 2023-07-26 DIAGNOSIS — R820 Chyluria: Secondary | ICD-10-CM

## 2023-07-26 DIAGNOSIS — D689 Coagulation defect, unspecified: Secondary | ICD-10-CM | POA: Diagnosis not present

## 2023-07-26 DIAGNOSIS — N2581 Secondary hyperparathyroidism of renal origin: Secondary | ICD-10-CM | POA: Diagnosis not present

## 2023-07-26 DIAGNOSIS — L299 Pruritus, unspecified: Secondary | ICD-10-CM | POA: Diagnosis not present

## 2023-07-26 DIAGNOSIS — Z992 Dependence on renal dialysis: Secondary | ICD-10-CM | POA: Diagnosis not present

## 2023-07-27 ENCOUNTER — Other Ambulatory Visit

## 2023-07-27 DIAGNOSIS — R820 Chyluria: Secondary | ICD-10-CM

## 2023-07-28 ENCOUNTER — Telehealth (INDEPENDENT_AMBULATORY_CARE_PROVIDER_SITE_OTHER): Payer: Self-pay

## 2023-07-28 DIAGNOSIS — N186 End stage renal disease: Secondary | ICD-10-CM | POA: Diagnosis not present

## 2023-07-28 DIAGNOSIS — L299 Pruritus, unspecified: Secondary | ICD-10-CM | POA: Diagnosis not present

## 2023-07-28 DIAGNOSIS — R52 Pain, unspecified: Secondary | ICD-10-CM | POA: Diagnosis not present

## 2023-07-28 DIAGNOSIS — D689 Coagulation defect, unspecified: Secondary | ICD-10-CM | POA: Diagnosis not present

## 2023-07-28 DIAGNOSIS — N2581 Secondary hyperparathyroidism of renal origin: Secondary | ICD-10-CM | POA: Diagnosis not present

## 2023-07-28 DIAGNOSIS — Z992 Dependence on renal dialysis: Secondary | ICD-10-CM | POA: Diagnosis not present

## 2023-07-28 NOTE — Telephone Encounter (Signed)
 Spoke with the patient to schedule him for a left arm AVF jump graft with Dr. Vonna Guardian. Patient let me know he was at [facility] and to contact them. I called and left a message for Toshia at [facility] regarding the patient. Patient is scheduled on 08/05/23 at the [facility]. Pre-admit will call to schedule a pre-op. This information will be sent to Toshia at [facility].

## 2023-07-30 DIAGNOSIS — L299 Pruritus, unspecified: Secondary | ICD-10-CM | POA: Diagnosis not present

## 2023-07-30 DIAGNOSIS — D689 Coagulation defect, unspecified: Secondary | ICD-10-CM | POA: Diagnosis not present

## 2023-07-30 DIAGNOSIS — N2581 Secondary hyperparathyroidism of renal origin: Secondary | ICD-10-CM | POA: Diagnosis not present

## 2023-07-30 DIAGNOSIS — Z992 Dependence on renal dialysis: Secondary | ICD-10-CM | POA: Diagnosis not present

## 2023-07-30 DIAGNOSIS — R52 Pain, unspecified: Secondary | ICD-10-CM | POA: Diagnosis not present

## 2023-07-30 DIAGNOSIS — N186 End stage renal disease: Secondary | ICD-10-CM | POA: Diagnosis not present

## 2023-08-02 DIAGNOSIS — R5383 Other fatigue: Secondary | ICD-10-CM | POA: Diagnosis not present

## 2023-08-02 DIAGNOSIS — D689 Coagulation defect, unspecified: Secondary | ICD-10-CM | POA: Diagnosis not present

## 2023-08-02 DIAGNOSIS — R112 Nausea with vomiting, unspecified: Secondary | ICD-10-CM | POA: Diagnosis not present

## 2023-08-02 DIAGNOSIS — R52 Pain, unspecified: Secondary | ICD-10-CM | POA: Diagnosis not present

## 2023-08-02 DIAGNOSIS — L299 Pruritus, unspecified: Secondary | ICD-10-CM | POA: Diagnosis not present

## 2023-08-02 DIAGNOSIS — E1169 Type 2 diabetes mellitus with other specified complication: Secondary | ICD-10-CM | POA: Diagnosis not present

## 2023-08-02 DIAGNOSIS — N186 End stage renal disease: Secondary | ICD-10-CM | POA: Diagnosis not present

## 2023-08-02 DIAGNOSIS — E11621 Type 2 diabetes mellitus with foot ulcer: Secondary | ICD-10-CM | POA: Diagnosis not present

## 2023-08-02 DIAGNOSIS — Z992 Dependence on renal dialysis: Secondary | ICD-10-CM | POA: Diagnosis not present

## 2023-08-02 DIAGNOSIS — L97528 Non-pressure chronic ulcer of other part of left foot with other specified severity: Secondary | ICD-10-CM | POA: Diagnosis not present

## 2023-08-02 DIAGNOSIS — N2581 Secondary hyperparathyroidism of renal origin: Secondary | ICD-10-CM | POA: Diagnosis not present

## 2023-08-02 DIAGNOSIS — E1122 Type 2 diabetes mellitus with diabetic chronic kidney disease: Secondary | ICD-10-CM | POA: Diagnosis not present

## 2023-08-02 LAB — CULTURE, URINE COMPREHENSIVE

## 2023-08-03 ENCOUNTER — Inpatient Hospital Stay
Admission: RE | Admit: 2023-08-03 | Discharge: 2023-08-03 | Disposition: A | Discharge status: Home / Self Care | Source: Ambulatory Visit

## 2023-08-03 ENCOUNTER — Ambulatory Visit: Payer: Self-pay | Admitting: Urology

## 2023-08-03 ENCOUNTER — Other Ambulatory Visit (INDEPENDENT_AMBULATORY_CARE_PROVIDER_SITE_OTHER): Payer: Self-pay | Admitting: Nurse Practitioner

## 2023-08-03 DIAGNOSIS — N186 End stage renal disease: Secondary | ICD-10-CM

## 2023-08-03 DIAGNOSIS — L97225 Non-pressure chronic ulcer of left calf with muscle involvement without evidence of necrosis: Secondary | ICD-10-CM | POA: Diagnosis not present

## 2023-08-03 NOTE — Patient Instructions (Signed)
 Your procedure is scheduled on: Thursday 08/05/23 To find out your arrival time, please call [phone] between 1PM - 3PM on:  Wednesday 08/04/23  Report to the Registration Desk on the 1st floor of the Medical Mall. Free Valet parking is available.  If your arrival time is 6:00 am, do not arrive before that time as the Medical Mall entrance doors do not open until 6:00 am.  REMEMBER: Instructions that are not followed completely may result in serious medical risk, up to and including death; or upon the discretion of your surgeon and anesthesiologist your surgery may need to be rescheduled.  Do not eat food or drink any liquids after midnight the night before surgery.  No gum chewing or hard candies.  One week prior to surgery: Stop Anti-inflammatories (NSAIDS) such as Advil, Aleve, Ibuprofen, Motrin, Naproxen, Naprosyn and Aspirin  based products such as Excedrin, Goody's Powder, BC Powder. You may however, continue to take Tylenol  if needed for pain up until the day of surgery.  Stop ANY OVER THE COUNTER supplements and vitamins until after surgery.  Continue taking all prescribed medications with the exception of the following:  **Follow guidelines for insulin  and diabetes medications**  Follow recommendations from Cardiologist or PCP regarding stopping blood thinners.  TAKE ONLY THESE MEDICATIONS THE MORNING OF SURGERY WITH A SIP OF WATER:    Antacid (take one the night before and one on the morning of surgery - helps to prevent nausea after surgery.)  Use inhalers on the day of surgery and bring to the hospital.  No Alcohol for 24 hours before or after surgery.  No Smoking including e-cigarettes for 24 hours before surgery.  No chewable tobacco products for at least 6 hours before surgery.  No nicotine patches on the day of surgery.  Do not use any "recreational" drugs for at least a week (preferably 2 weeks) before your surgery.  Please be advised that the combination  of cocaine and anesthesia may have negative outcomes, up to and including death. If you test positive for cocaine, your surgery will be cancelled.  On the morning of surgery brush your teeth with toothpaste and water, you may rinse your mouth with mouthwash if you wish. Do not swallow any toothpaste or mouthwash.  Use CHG Soap or wipes as directed on instruction sheet.  Do not wear lotions, powders, or perfumes.   Do not shave body hair from the neck down 48 hours before surgery.  Wear comfortable clothing (specific to your surgery type) to the hospital.  Do not wear jewelry, make-up, hairpins, clips or nail polish.  For welded (permanent) jewelry: bracelets, anklets, waist bands, etc.  Please have this removed prior to surgery.  If it is not removed, there is a chance that hospital personnel will need to cut it off on the day of surgery. Contact lenses, hearing aids and dentures may not be worn into surgery.  Bring your C-PAP to the hospital in case you may have to spend the night.   Do not bring valuables to the hospital. [facility] is not responsible for any missing/lost belongings or valuables.   Notify your doctor if there is any change in your medical condition (cold, fever, infection).  If you are being discharged the day of surgery, you will not be allowed to drive home. You will need a responsible individual to drive you home and stay with you for 24 hours after surgery.   If you are taking public transportation, you will need to have  a responsible individual with you.  If you are being admitted to the hospital overnight, leave your suitcase in the car. After surgery it may be brought to your room.  In case of increased patient census, it may be necessary for you, the patient, to continue your postoperative care in the Same Day Surgery department.  After surgery, you can help prevent lung complications by doing breathing exercises.  Take deep breaths and cough every 1-2  hours. Your doctor may order a device called an Incentive Spirometer to help you take deep breaths. When coughing or sneezing, hold a pillow firmly against your incision with both hands. This is called "splinting." Doing this helps protect your incision. It also decreases belly discomfort.  Surgery Visitation Policy:  Patients undergoing a surgery or procedure may have two family members or support persons with them as long as the person is not COVID-19 positive or experiencing its symptoms.   Please call the Pre-admissions Testing Dept. at [phone] if you have any questions about these instructions.  Preparing the Skin Before Surgery     To help prevent the risk of infection at your surgical site, we are now providing you with rinse-free Sage 2% Chlorhexidine  Gluconate (CHG) disposable wipes.  Chlorhexidine  Gluconate (CHG) Soap  o An antiseptic cleaner that kills germs and bonds with the skin to continue killing germs even after washing  o Used for showering the night before surgery and morning of surgery  The night before surgery: Shower or bathe with warm water. Do not apply perfume, lotions, powders. Wait one hour after shower. Skin should be dry and cool. Open Sage wipe package - use 6 disposable cloths. Wipe body using one cloth for the right arm, one cloth for the left arm, one cloth for the right leg, one cloth for the left leg, one cloth for the chest/abdomen area, and one cloth for the back. Do not use on open wounds or sores. Do not use on face or genitals (private parts). If you are breast feeding, do not use on breasts. 5. Do not rinse, allow to dry. 6. Skin may feel "tacky" for several minutes. 7. Dress in clean clothes. 8. Place clean sheets on your bed and do not sleep with pets.  REPEAT ABOVE ON THE MORNING OF SURGERY BEFORE ARRIVING TO THE HOSPITAL.

## 2023-08-03 NOTE — Pre-Procedure Instructions (Signed)
 Patient was a no show for 1 pm appt today. 2 attempts via phone to reach pt unsuccessful. Voicemail full. Moved to phone interview for 5/21. Surgery is 5/22

## 2023-08-04 ENCOUNTER — Encounter
Admission: RE | Admit: 2023-08-04 | Discharge: 2023-08-04 | Disposition: A | Discharge status: Home / Self Care | Source: Ambulatory Visit | Attending: Vascular Surgery | Admitting: Vascular Surgery

## 2023-08-04 DIAGNOSIS — E1122 Type 2 diabetes mellitus with diabetic chronic kidney disease: Secondary | ICD-10-CM

## 2023-08-04 DIAGNOSIS — N2581 Secondary hyperparathyroidism of renal origin: Secondary | ICD-10-CM | POA: Diagnosis not present

## 2023-08-04 DIAGNOSIS — D689 Coagulation defect, unspecified: Secondary | ICD-10-CM | POA: Diagnosis not present

## 2023-08-04 DIAGNOSIS — L299 Pruritus, unspecified: Secondary | ICD-10-CM | POA: Diagnosis not present

## 2023-08-04 DIAGNOSIS — N186 End stage renal disease: Secondary | ICD-10-CM | POA: Diagnosis not present

## 2023-08-04 DIAGNOSIS — Z01812 Encounter for preprocedural laboratory examination: Secondary | ICD-10-CM

## 2023-08-04 DIAGNOSIS — R52 Pain, unspecified: Secondary | ICD-10-CM | POA: Diagnosis not present

## 2023-08-04 DIAGNOSIS — Z992 Dependence on renal dialysis: Secondary | ICD-10-CM | POA: Diagnosis not present

## 2023-08-04 HISTORY — DX: End stage renal disease: N18.6

## 2023-08-04 HISTORY — DX: Anemia in chronic kidney disease: N18.9

## 2023-08-04 HISTORY — DX: Chronic atrial fibrillation, unspecified: I48.20

## 2023-08-04 HISTORY — DX: Type 2 diabetes mellitus with unspecified complications: E11.8

## 2023-08-04 HISTORY — DX: Cellulitis of left lower limb: L03.116

## 2023-08-04 HISTORY — DX: End stage renal disease: Z99.2

## 2023-08-04 HISTORY — DX: Anemia in chronic kidney disease: D63.1

## 2023-08-04 MED ORDER — ORAL CARE MOUTH RINSE: 15.0000 mL | Freq: Once | OROMUCOSAL | Status: DC

## 2023-08-04 MED ORDER — CEFAZOLIN SODIUM-DEXTROSE 2-4 GM/100ML-% IV SOLN
2.0000 g | INTRAVENOUS | Status: AC
Start: 2023-08-05 — End: 2023-08-05
  Administered 2023-08-05 (×2): 2 g via INTRAVENOUS

## 2023-08-04 MED ORDER — CHLORHEXIDINE GLUCONATE 0.12 % MT SOLN: 15.0000 mL | Freq: Once | OROMUCOSAL | Status: DC

## 2023-08-04 MED ORDER — CHLORHEXIDINE GLUCONATE CLOTH 2 % EX PADS
6.0000 | MEDICATED_PAD | Freq: Once | CUTANEOUS | Status: DC
Start: 2023-08-04 — End: 2023-08-05

## 2023-08-04 MED ORDER — CHLORHEXIDINE GLUCONATE CLOTH 2 % EX PADS: 6.0000 | MEDICATED_PAD | Freq: Once | CUTANEOUS | Status: DC

## 2023-08-04 MED ORDER — SODIUM CHLORIDE 0.9 % IV SOLN: INTRAVENOUS | Status: DC

## 2023-08-04 NOTE — Patient Instructions (Signed)
 Your procedure is scheduled on: Thursday, May 22 Report to the Registration Desk on the 1st floor of the [facility] at 6 am. If your arrival time is 6:00 am, do not arrive before that time as the Medical Mall entrance doors do not open until 6:00 am.  REMEMBER: Instructions that are not followed completely may result in serious medical risk, up to and including death; or upon the discretion of your surgeon and anesthesiologist your surgery may need to be rescheduled.  Do not eat or drink after midnight the night before surgery.  No gum chewing or hard candies.  One week prior to surgery:  Stop Anti-inflammatories (NSAIDS) such as Advil, Aleve, Ibuprofen, Motrin, Naproxen, Naprosyn and Aspirin  based products such as Excedrin, Goody's Powder, BC Powder. Stop ANY OVER THE COUNTER supplements until after surgery.  You may however, continue to take Tylenol  if needed for pain up until the day of surgery.  Eliquis  - DO NOT TAKE ANY MORE. Resume AFTER surgery per surgeon instruction.  Continue taking all of your other prescription medications up until the day of surgery.  ON THE DAY OF SURGERY ONLY TAKE THESE MEDICATIONS WITH SIPS OF WATER:  Amiodarone  Carvedilol  Rosuvastatin   No Alcohol for 24 hours before or after surgery.  No Smoking including e-cigarettes for 24 hours before surgery.  No chewable tobacco products for at least 6 hours before surgery.  No nicotine patches on the day of surgery.  Do not use any "recreational" drugs for at least a week (preferably 2 weeks) before your surgery.  Please be advised that the combination of cocaine and anesthesia may have negative outcomes, up to and including death. If you test positive for cocaine, your surgery will be cancelled.  On the morning of surgery brush your teeth with toothpaste and water, you may rinse your mouth with mouthwash if you wish. Do not swallow any toothpaste or mouthwash.  Shower using antibacterial soap prior to  coming to the hospital and wear clean clothes.  Do not wear jewelry, make-up, hairpins, clips or nail polish.  For welded (permanent) jewelry: bracelets, anklets, waist bands, etc.  Please have this removed prior to surgery.  If it is not removed, there is a chance that hospital personnel will need to cut it off on the day of surgery.  Do not wear lotions, powders, or perfumes.   Do not shave body hair from the neck down 48 hours before surgery.  Contact lenses, hearing aids and dentures may not be worn into surgery.  Do not bring valuables to the hospital. [facility] is not responsible for any missing/lost belongings or valuables.   Notify your doctor if there is any change in your medical condition (cold, fever, infection).  Wear comfortable clothing (specific to your surgery type) to the hospital.  After surgery, you can help prevent lung complications by doing breathing exercises.  Take deep breaths and cough every 1-2 hours.   If you are being discharged the day of surgery, you will not be allowed to drive home. You will need a responsible individual to drive you home and stay with you for 24 hours after surgery.   If you are taking public transportation, you will need to have a responsible individual with you.  Please call the Pre-admissions Testing Dept. at [phone] if you have any questions about these instructions.  Surgery Visitation Policy:  Patients having surgery or a procedure may have two visitors.  Children under the age of 59 must have an  adult with them who is not the patient.

## 2023-08-04 NOTE — Pre-Procedure Instructions (Signed)
   No show for PAT appointment yesterday, May 20. After several calls to [facility], was able to receive current med list and fax back instructions for surgery. [facility] scheduler) notified of 6 am arrival time tomorrow.

## 2023-08-05 ENCOUNTER — Other Ambulatory Visit: Payer: Self-pay

## 2023-08-05 ENCOUNTER — Encounter: Payer: Self-pay | Admitting: Vascular Surgery

## 2023-08-05 ENCOUNTER — Inpatient Hospital Stay

## 2023-08-05 ENCOUNTER — Ambulatory Visit

## 2023-08-05 ENCOUNTER — Inpatient Hospital Stay
Admission: AD | Admit: 2023-08-05 | Discharge: 2023-08-16 | DRG: 252 | Disposition: A | Discharge status: Home / Self Care | Attending: Vascular Surgery | Admitting: Vascular Surgery

## 2023-08-05 ENCOUNTER — Ambulatory Visit: Admitting: Urgent Care

## 2023-08-05 ENCOUNTER — Ambulatory Visit: Admitting: Anesthesiology

## 2023-08-05 ENCOUNTER — Encounter
Admission: AD | Disposition: A | Discharge status: Home / Self Care | Payer: Self-pay | Source: Home / Self Care | Attending: Vascular Surgery

## 2023-08-05 DIAGNOSIS — I872 Venous insufficiency (chronic) (peripheral): Secondary | ICD-10-CM

## 2023-08-05 DIAGNOSIS — D62 Acute posthemorrhagic anemia: Secondary | ICD-10-CM | POA: Diagnosis present

## 2023-08-05 DIAGNOSIS — T82898A Other specified complication of vascular prosthetic devices, implants and grafts, initial encounter: Secondary | ICD-10-CM

## 2023-08-05 DIAGNOSIS — E1169 Type 2 diabetes mellitus with other specified complication: Secondary | ICD-10-CM | POA: Diagnosis present

## 2023-08-05 DIAGNOSIS — T82510A Breakdown (mechanical) of surgically created arteriovenous fistula, initial encounter: Secondary | ICD-10-CM | POA: Diagnosis present

## 2023-08-05 DIAGNOSIS — E875 Hyperkalemia: Secondary | ICD-10-CM | POA: Diagnosis present

## 2023-08-05 DIAGNOSIS — L8990 Pressure ulcer of unspecified site, unspecified stage: Secondary | ICD-10-CM | POA: Insufficient documentation

## 2023-08-05 DIAGNOSIS — Z01810 Encounter for preprocedural cardiovascular examination: Secondary | ICD-10-CM | POA: Diagnosis not present

## 2023-08-05 DIAGNOSIS — Z7901 Long term (current) use of anticoagulants: Secondary | ICD-10-CM

## 2023-08-05 DIAGNOSIS — E2740 Unspecified adrenocortical insufficiency: Secondary | ICD-10-CM | POA: Diagnosis present

## 2023-08-05 DIAGNOSIS — I5042 Chronic combined systolic (congestive) and diastolic (congestive) heart failure: Secondary | ICD-10-CM | POA: Diagnosis present

## 2023-08-05 DIAGNOSIS — I2510 Atherosclerotic heart disease of native coronary artery without angina pectoris: Secondary | ICD-10-CM | POA: Diagnosis present

## 2023-08-05 DIAGNOSIS — E1151 Type 2 diabetes mellitus with diabetic peripheral angiopathy without gangrene: Secondary | ICD-10-CM | POA: Diagnosis present

## 2023-08-05 DIAGNOSIS — I739 Peripheral vascular disease, unspecified: Secondary | ICD-10-CM | POA: Diagnosis present

## 2023-08-05 DIAGNOSIS — Z89611 Acquired absence of right leg above knee: Secondary | ICD-10-CM | POA: Diagnosis not present

## 2023-08-05 DIAGNOSIS — Z8249 Family history of ischemic heart disease and other diseases of the circulatory system: Secondary | ICD-10-CM

## 2023-08-05 DIAGNOSIS — G894 Chronic pain syndrome: Secondary | ICD-10-CM | POA: Diagnosis present

## 2023-08-05 DIAGNOSIS — Z885 Allergy status to narcotic agent status: Secondary | ICD-10-CM

## 2023-08-05 DIAGNOSIS — J90 Pleural effusion, not elsewhere classified: Secondary | ICD-10-CM | POA: Diagnosis not present

## 2023-08-05 DIAGNOSIS — T508X5A Adverse effect of diagnostic agents, initial encounter: Secondary | ICD-10-CM | POA: Diagnosis not present

## 2023-08-05 DIAGNOSIS — R578 Other shock: Secondary | ICD-10-CM | POA: Diagnosis not present

## 2023-08-05 DIAGNOSIS — E785 Hyperlipidemia, unspecified: Secondary | ICD-10-CM | POA: Diagnosis present

## 2023-08-05 DIAGNOSIS — J9562 Intraoperative hemorrhage and hematoma of a respiratory system organ or structure complicating other procedure: Secondary | ICD-10-CM

## 2023-08-05 DIAGNOSIS — J95821 Acute postprocedural respiratory failure: Secondary | ICD-10-CM | POA: Diagnosis not present

## 2023-08-05 DIAGNOSIS — E1122 Type 2 diabetes mellitus with diabetic chronic kidney disease: Secondary | ICD-10-CM | POA: Diagnosis not present

## 2023-08-05 DIAGNOSIS — Y832 Surgical operation with anastomosis, bypass or graft as the cause of abnormal reaction of the patient, or of later complication, without mention of misadventure at the time of the procedure: Secondary | ICD-10-CM | POA: Diagnosis present

## 2023-08-05 DIAGNOSIS — I742 Embolism and thrombosis of arteries of the upper extremities: Secondary | ICD-10-CM | POA: Diagnosis not present

## 2023-08-05 DIAGNOSIS — E11621 Type 2 diabetes mellitus with foot ulcer: Secondary | ICD-10-CM | POA: Diagnosis present

## 2023-08-05 DIAGNOSIS — T82858A Stenosis of vascular prosthetic devices, implants and grafts, initial encounter: Secondary | ICD-10-CM | POA: Diagnosis not present

## 2023-08-05 DIAGNOSIS — I517 Cardiomegaly: Secondary | ICD-10-CM | POA: Diagnosis not present

## 2023-08-05 DIAGNOSIS — Z9889 Other specified postprocedural states: Secondary | ICD-10-CM | POA: Diagnosis not present

## 2023-08-05 DIAGNOSIS — Z992 Dependence on renal dialysis: Secondary | ICD-10-CM | POA: Diagnosis not present

## 2023-08-05 DIAGNOSIS — M545 Low back pain, unspecified: Secondary | ICD-10-CM | POA: Diagnosis not present

## 2023-08-05 DIAGNOSIS — I6782 Cerebral ischemia: Secondary | ICD-10-CM | POA: Diagnosis not present

## 2023-08-05 DIAGNOSIS — Z01812 Encounter for preprocedural laboratory examination: Secondary | ICD-10-CM

## 2023-08-05 DIAGNOSIS — E669 Obesity, unspecified: Secondary | ICD-10-CM | POA: Diagnosis present

## 2023-08-05 DIAGNOSIS — E871 Hypo-osmolality and hyponatremia: Secondary | ICD-10-CM | POA: Diagnosis present

## 2023-08-05 DIAGNOSIS — I482 Chronic atrial fibrillation, unspecified: Secondary | ICD-10-CM | POA: Diagnosis not present

## 2023-08-05 DIAGNOSIS — N186 End stage renal disease: Secondary | ICD-10-CM | POA: Diagnosis present

## 2023-08-05 DIAGNOSIS — R918 Other nonspecific abnormal finding of lung field: Secondary | ICD-10-CM | POA: Diagnosis not present

## 2023-08-05 DIAGNOSIS — Z452 Encounter for adjustment and management of vascular access device: Secondary | ICD-10-CM | POA: Diagnosis not present

## 2023-08-05 DIAGNOSIS — T797XXA Traumatic subcutaneous emphysema, initial encounter: Secondary | ICD-10-CM | POA: Diagnosis not present

## 2023-08-05 DIAGNOSIS — Z79899 Other long term (current) drug therapy: Secondary | ICD-10-CM

## 2023-08-05 DIAGNOSIS — L97428 Non-pressure chronic ulcer of left heel and midfoot with other specified severity: Secondary | ICD-10-CM | POA: Diagnosis present

## 2023-08-05 DIAGNOSIS — L89522 Pressure ulcer of left ankle, stage 2: Secondary | ICD-10-CM | POA: Diagnosis present

## 2023-08-05 DIAGNOSIS — I959 Hypotension, unspecified: Secondary | ICD-10-CM | POA: Diagnosis not present

## 2023-08-05 DIAGNOSIS — I10 Essential (primary) hypertension: Secondary | ICD-10-CM | POA: Diagnosis not present

## 2023-08-05 DIAGNOSIS — Z6830 Body mass index (BMI) 30.0-30.9, adult: Secondary | ICD-10-CM

## 2023-08-05 DIAGNOSIS — R579 Shock, unspecified: Secondary | ICD-10-CM

## 2023-08-05 DIAGNOSIS — J9600 Acute respiratory failure, unspecified whether with hypoxia or hypercapnia: Secondary | ICD-10-CM | POA: Diagnosis not present

## 2023-08-05 DIAGNOSIS — Z5900 Homelessness unspecified: Secondary | ICD-10-CM

## 2023-08-05 DIAGNOSIS — I132 Hypertensive heart and chronic kidney disease with heart failure and with stage 5 chronic kidney disease, or end stage renal disease: Secondary | ICD-10-CM | POA: Diagnosis present

## 2023-08-05 DIAGNOSIS — I120 Hypertensive chronic kidney disease with stage 5 chronic kidney disease or end stage renal disease: Secondary | ICD-10-CM | POA: Diagnosis not present

## 2023-08-05 DIAGNOSIS — I5020 Unspecified systolic (congestive) heart failure: Secondary | ICD-10-CM | POA: Diagnosis present

## 2023-08-05 DIAGNOSIS — Z4682 Encounter for fitting and adjustment of non-vascular catheter: Secondary | ICD-10-CM | POA: Diagnosis not present

## 2023-08-05 DIAGNOSIS — N189 Chronic kidney disease, unspecified: Secondary | ICD-10-CM | POA: Diagnosis present

## 2023-08-05 DIAGNOSIS — T886XXA Anaphylactic reaction due to adverse effect of correct drug or medicament properly administered, initial encounter: Secondary | ICD-10-CM | POA: Diagnosis not present

## 2023-08-05 DIAGNOSIS — J942 Hemothorax: Secondary | ICD-10-CM | POA: Diagnosis not present

## 2023-08-05 DIAGNOSIS — I509 Heart failure, unspecified: Secondary | ICD-10-CM | POA: Diagnosis not present

## 2023-08-05 DIAGNOSIS — N2581 Secondary hyperparathyroidism of renal origin: Secondary | ICD-10-CM | POA: Diagnosis present

## 2023-08-05 DIAGNOSIS — D631 Anemia in chronic kidney disease: Secondary | ICD-10-CM | POA: Diagnosis present

## 2023-08-05 DIAGNOSIS — J9690 Respiratory failure, unspecified, unspecified whether with hypoxia or hypercapnia: Secondary | ICD-10-CM | POA: Diagnosis not present

## 2023-08-05 DIAGNOSIS — T82868A Thrombosis of vascular prosthetic devices, implants and grafts, initial encounter: Secondary | ICD-10-CM | POA: Diagnosis not present

## 2023-08-05 DIAGNOSIS — J939 Pneumothorax, unspecified: Secondary | ICD-10-CM | POA: Diagnosis not present

## 2023-08-05 DIAGNOSIS — Z7982 Long term (current) use of aspirin: Secondary | ICD-10-CM

## 2023-08-05 DIAGNOSIS — G8918 Other acute postprocedural pain: Secondary | ICD-10-CM | POA: Diagnosis not present

## 2023-08-05 DIAGNOSIS — I4892 Unspecified atrial flutter: Secondary | ICD-10-CM | POA: Diagnosis not present

## 2023-08-05 DIAGNOSIS — J9811 Atelectasis: Secondary | ICD-10-CM | POA: Diagnosis not present

## 2023-08-05 DIAGNOSIS — I700 Atherosclerosis of aorta: Secondary | ICD-10-CM | POA: Diagnosis not present

## 2023-08-05 DIAGNOSIS — G9389 Other specified disorders of brain: Secondary | ICD-10-CM | POA: Diagnosis not present

## 2023-08-05 HISTORY — PX: REVISON OF ARTERIOVENOUS FISTULA: SHX6074

## 2023-08-05 HISTORY — PX: DIALYSIS/PERMA CATHETER INSERTION: CATH118288

## 2023-08-05 HISTORY — PX: CENTRAL LINE INSERTION: CATH118232

## 2023-08-05 HISTORY — PX: GROIN DISSECTION: SHX5250

## 2023-08-05 LAB — POCT I-STAT, CHEM 8
BUN: 31 mg/dL — ABNORMAL HIGH (ref 8–23)
Calcium, Ion: 1.06 mmol/L — ABNORMAL LOW (ref 1.15–1.40)
Chloride: 99 mmol/L (ref 98–111)
Creatinine, Ser: 8.4 mg/dL — ABNORMAL HIGH (ref 0.61–1.24)
Glucose, Bld: 102 mg/dL — ABNORMAL HIGH (ref 70–99)
HCT: 48 % (ref 39.0–52.0)
Hemoglobin: 16.3 g/dL (ref 13.0–17.0)
Potassium: 4.2 mmol/L (ref 3.5–5.1)
Sodium: 136 mmol/L (ref 135–145)
TCO2: 30 mmol/L (ref 22–32)

## 2023-08-05 LAB — BLOOD GAS, ARTERIAL
Acid-Base Excess: 2 mmol/L (ref 0.0–2.0)
Bicarbonate: 26.5 mmol/L (ref 20.0–28.0)
FIO2: 60 %
MECHVT: 540 mL
Mechanical Rate: 16
O2 Saturation: 98.3 %
PEEP: 8 cmH2O
Patient temperature: 37
pCO2 arterial: 40 mmHg (ref 32–48)
pH, Arterial: 7.43 (ref 7.35–7.45)
pO2, Arterial: 82 mmHg — ABNORMAL LOW (ref 83–108)

## 2023-08-05 LAB — COMPREHENSIVE METABOLIC PANEL WITH GFR
ALT: 17 U/L (ref 0–44)
AST: 17 U/L (ref 15–41)
Albumin: 3.2 g/dL — ABNORMAL LOW (ref 3.5–5.0)
Alkaline Phosphatase: 84 U/L (ref 38–126)
Anion gap: 12 (ref 5–15)
BUN: 34 mg/dL — ABNORMAL HIGH (ref 8–23)
CO2: 26 mmol/L (ref 22–32)
Calcium: 8.1 mg/dL — ABNORMAL LOW (ref 8.9–10.3)
Chloride: 95 mmol/L — ABNORMAL LOW (ref 98–111)
Creatinine, Ser: 8.72 mg/dL — ABNORMAL HIGH (ref 0.61–1.24)
GFR, Estimated: 6 mL/min — ABNORMAL LOW (ref >60)
Glucose, Bld: 243 mg/dL — ABNORMAL HIGH (ref 70–99)
Potassium: 5.5 mmol/L — ABNORMAL HIGH (ref 3.5–5.1)
Sodium: 133 mmol/L — ABNORMAL LOW (ref 135–145)
Total Bilirubin: 0.8 mg/dL (ref 0.0–1.2)
Total Protein: 6.5 g/dL (ref 6.5–8.1)

## 2023-08-05 LAB — DIFFERENTIAL
Abs Immature Granulocytes: 0.1 10*3/uL — ABNORMAL HIGH (ref 0.00–0.07)
Basophils Absolute: 0.1 10*3/uL (ref 0.0–0.1)
Basophils Relative: 0 %
Eosinophils Absolute: 0.1 10*3/uL (ref 0.0–0.5)
Eosinophils Relative: 1 %
Immature Granulocytes: 1 %
Lymphocytes Relative: 11 %
Lymphs Abs: 1.6 10*3/uL (ref 0.7–4.0)
Monocytes Absolute: 0.4 10*3/uL (ref 0.1–1.0)
Monocytes Relative: 3 %
Neutro Abs: 12.6 10*3/uL — ABNORMAL HIGH (ref 1.7–7.7)
Neutrophils Relative %: 84 %

## 2023-08-05 LAB — PROTIME-INR
INR: 1.3 — ABNORMAL HIGH (ref 0.8–1.2)
Prothrombin Time: 16.8 s — ABNORMAL HIGH (ref 11.4–15.2)

## 2023-08-05 LAB — CBC WITH DIFFERENTIAL/PLATELET
Abs Immature Granulocytes: 0.02 10*3/uL (ref 0.00–0.07)
Basophils Absolute: 0.1 10*3/uL (ref 0.0–0.1)
Basophils Relative: 2 %
Eosinophils Absolute: 0.4 10*3/uL (ref 0.0–0.5)
Eosinophils Relative: 9 %
HCT: 45.4 % (ref 39.0–52.0)
Hemoglobin: 14.5 g/dL (ref 13.0–17.0)
Immature Granulocytes: 1 %
Lymphocytes Relative: 35 %
Lymphs Abs: 1.3 10*3/uL (ref 0.7–4.0)
MCH: 28.2 pg (ref 26.0–34.0)
MCHC: 31.9 g/dL (ref 30.0–36.0)
MCV: 88.2 fL (ref 80.0–100.0)
Monocytes Absolute: 0.4 10*3/uL (ref 0.1–1.0)
Monocytes Relative: 9 %
Neutro Abs: 1.7 10*3/uL (ref 1.7–7.7)
Neutrophils Relative %: 44 %
Platelets: 108 10*3/uL — ABNORMAL LOW (ref 150–400)
RBC: 5.15 MIL/uL (ref 4.22–5.81)
RDW: 15.5 % (ref 11.5–15.5)
WBC: 3.8 10*3/uL — ABNORMAL LOW (ref 4.0–10.5)
nRBC: 0 % (ref 0.0–0.2)

## 2023-08-05 LAB — CBC
HCT: 37.1 % — ABNORMAL LOW (ref 39.0–52.0)
HCT: 35.4 % — ABNORMAL LOW (ref 39.0–52.0)
Hemoglobin: 11.9 g/dL — ABNORMAL LOW (ref 13.0–17.0)
Hemoglobin: 11.3 g/dL — ABNORMAL LOW (ref 13.0–17.0)
MCH: 28.4 pg (ref 26.0–34.0)
MCH: 28 pg (ref 26.0–34.0)
MCHC: 32.1 g/dL (ref 30.0–36.0)
MCHC: 31.9 g/dL (ref 30.0–36.0)
MCV: 88.5 fL (ref 80.0–100.0)
MCV: 87.6 fL (ref 80.0–100.0)
Platelets: 160 10*3/uL (ref 150–400)
Platelets: 151 10*3/uL (ref 150–400)
RBC: 4.19 MIL/uL — ABNORMAL LOW (ref 4.22–5.81)
RBC: 4.04 MIL/uL — ABNORMAL LOW (ref 4.22–5.81)
RDW: 15.5 % (ref 11.5–15.5)
RDW: 15.2 % (ref 11.5–15.5)
WBC: 15 10*3/uL — ABNORMAL HIGH (ref 4.0–10.5)
WBC: 13.5 10*3/uL — ABNORMAL HIGH (ref 4.0–10.5)
nRBC: 0 % (ref 0.0–0.2)
nRBC: 0.1 % (ref 0.0–0.2)

## 2023-08-05 LAB — TROPONIN I (HIGH SENSITIVITY)
Troponin I (High Sensitivity): 76 ng/L — ABNORMAL HIGH (ref <18)
Troponin I (High Sensitivity): 76 ng/L — ABNORMAL HIGH (ref <18)

## 2023-08-05 LAB — BASIC METABOLIC PANEL WITH GFR
Anion gap: 12 (ref 5–15)
BUN: 32 mg/dL — ABNORMAL HIGH (ref 8–23)
CO2: 30 mmol/L (ref 22–32)
Calcium: 9.1 mg/dL (ref 8.9–10.3)
Chloride: 93 mmol/L — ABNORMAL LOW (ref 98–111)
Creatinine, Ser: 8.47 mg/dL — ABNORMAL HIGH (ref 0.61–1.24)
GFR, Estimated: 7 mL/min — ABNORMAL LOW (ref >60)
Glucose, Bld: 102 mg/dL — ABNORMAL HIGH (ref 70–99)
Potassium: 4.2 mmol/L (ref 3.5–5.1)
Sodium: 135 mmol/L (ref 135–145)

## 2023-08-05 LAB — LACTIC ACID, PLASMA
Lactic Acid, Venous: 1.7 mmol/L (ref 0.5–1.9)
Lactic Acid, Venous: 2.5 mmol/L (ref 0.5–1.9)

## 2023-08-05 LAB — TYPE AND SCREEN
ABO/RH(D): A POS
Antibody Screen: NEGATIVE

## 2023-08-05 LAB — GLUCOSE, CAPILLARY
Glucose-Capillary: 86 mg/dL (ref 70–99)
Glucose-Capillary: 184 mg/dL — ABNORMAL HIGH (ref 70–99)
Glucose-Capillary: 140 mg/dL — ABNORMAL HIGH (ref 70–99)

## 2023-08-05 SURGERY — REVISON OF ARTERIOVENOUS FISTULA
Anesthesia: General | Site: Neck | Laterality: Right

## 2023-08-05 MED ORDER — VASOPRESSIN 20 UNIT/ML IV SOLN
INTRAVENOUS | Status: DC | PRN
  Administered 2023-08-05 (×9): 2 [IU] via INTRAVENOUS
  Administered 2023-08-05: 8 [IU] via INTRAVENOUS
  Administered 2023-08-05 (×2): 4 [IU] via INTRAVENOUS
  Administered 2023-08-05: 6 [IU] via INTRAVENOUS

## 2023-08-05 MED ORDER — FENTANYL CITRATE PF 50 MCG/ML IJ SOSY
50.0000 ug | PREFILLED_SYRINGE | INTRAMUSCULAR | Status: DC | PRN
  Administered 2023-08-07 (×2): 50 ug via INTRAVENOUS
  Filled 2023-08-05 (×3): qty 1

## 2023-08-05 MED ORDER — SODIUM CHLORIDE 0.9 % IR SOLN
Status: DC | PRN
  Administered 2023-08-05: 500 mL

## 2023-08-05 MED ORDER — HYDROMORPHONE HCL 1 MG/ML IJ SOLN: 1.0000 mg | Freq: Once | INTRAMUSCULAR | Status: DC | PRN

## 2023-08-05 MED ORDER — HYDRALAZINE HCL 20 MG/ML IJ SOLN: 5.0000 mg | INTRAMUSCULAR | Status: DC | PRN

## 2023-08-05 MED ORDER — ROCURONIUM BROMIDE 100 MG/10ML IV SOLN
INTRAVENOUS | Status: DC | PRN
Start: 2023-08-05 — End: 2023-08-05
  Administered 2023-08-05 (×2): 50 mg via INTRAVENOUS

## 2023-08-05 MED ORDER — HYDROCERIN EX CREA
TOPICAL_CREAM | Freq: Every day | CUTANEOUS | Status: DC
  Filled 2023-08-05: qty 113

## 2023-08-05 MED ORDER — CARVEDILOL 6.25 MG PO TABS: 6.2500 mg | ORAL_TABLET | Freq: Every day | ORAL | Status: DC

## 2023-08-05 MED ORDER — POLYETHYLENE GLYCOL 3350 17 G PO PACK
17.0000 g | PACK | Freq: Every day | ORAL | Status: DC
  Administered 2023-08-05 – 2023-08-07 (×3): 17 g
  Filled 2023-08-05 (×4): qty 1

## 2023-08-05 MED ORDER — EPINEPHRINE 1 MG/10ML IJ SOSY
PREFILLED_SYRINGE | INTRAMUSCULAR | Status: DC | PRN
Start: 2023-08-05 — End: 2023-08-05
  Administered 2023-08-05: 40 ug via SUBCUTANEOUS
  Administered 2023-08-05 (×4): 20 ug via SUBCUTANEOUS
  Administered 2023-08-05: 40 ug via SUBCUTANEOUS

## 2023-08-05 MED ORDER — BUPIVACAINE HCL (PF) 0.5 % IJ SOLN
INTRAMUSCULAR | Status: DC | PRN
  Administered 2023-08-05: 20 mL via PERINEURAL

## 2023-08-05 MED ORDER — LIDOCAINE HCL (PF) 2 % IJ SOLN
INTRAMUSCULAR | Status: DC | PRN
  Administered 2023-08-05: 10 mL via PERINEURAL

## 2023-08-05 MED ORDER — ALTEPLASE 2 MG IJ SOLR: 2.0000 mg | Freq: Once | INTRAMUSCULAR | Status: DC | PRN

## 2023-08-05 MED ORDER — HEPARIN SOD (PORK) LOCK FLUSH 100 UNIT/ML IV SOLN
INTRAVENOUS | Status: DC | PRN
  Administered 2023-08-05: 500 [IU]

## 2023-08-05 MED ORDER — NOREPINEPHRINE 4 MG/250ML-% IV SOLN
INTRAVENOUS | Status: AC
  Filled 2023-08-05: qty 250

## 2023-08-05 MED ORDER — IOHEXOL 180 MG/ML  SOLN
INTRAMUSCULAR | Status: DC | PRN
  Administered 2023-08-05: 10 mL

## 2023-08-05 MED ORDER — NOREPINEPHRINE 4 MG/250ML-% IV SOLN
INTRAVENOUS | Status: DC | PRN
  Administered 2023-08-05: 8 ug/min via INTRAVENOUS

## 2023-08-05 MED ORDER — LABETALOL HCL 5 MG/ML IV SOLN: 10.0000 mg | INTRAVENOUS | Status: DC | PRN

## 2023-08-05 MED ORDER — HYDROMORPHONE HCL 1 MG/ML IJ SOLN: 0.5000 mg | INTRAMUSCULAR | Status: DC | PRN

## 2023-08-05 MED ORDER — ALUM & MAG HYDROXIDE-SIMETH 200-200-20 MG/5ML PO SUSP: 15.0000 mL | ORAL | Status: DC | PRN

## 2023-08-05 MED ORDER — IODIXANOL 320 MG/ML IV SOLN
INTRAVENOUS | Status: DC | PRN
  Administered 2023-08-05: 20 mL

## 2023-08-05 MED ORDER — OXYCODONE-ACETAMINOPHEN 5-325 MG PO TABS
1.0000 | ORAL_TABLET | ORAL | Status: DC | PRN
  Administered 2023-08-05: 1 via ORAL
  Administered 2023-08-09: 2 via ORAL
  Administered 2023-08-09 – 2023-08-10 (×3): 1 via ORAL
  Administered 2023-08-10 – 2023-08-15 (×5): 2 via ORAL
  Filled 2023-08-05 (×3): qty 1
  Filled 2023-08-05 (×6): qty 2
  Filled 2023-08-05: qty 1

## 2023-08-05 MED ORDER — PROPOFOL 1000 MG/100ML IV EMUL
INTRAVENOUS | Status: AC
  Filled 2023-08-05: qty 100

## 2023-08-05 MED ORDER — METOPROLOL TARTRATE 5 MG/5ML IV SOLN: 2.0000 mg | INTRAVENOUS | Status: DC | PRN

## 2023-08-05 MED ORDER — FENTANYL CITRATE PF 50 MCG/ML IJ SOSY
50.0000 ug | PREFILLED_SYRINGE | INTRAMUSCULAR | Status: DC | PRN
  Administered 2023-08-06 (×2): 100 ug via INTRAVENOUS
  Administered 2023-08-07: 50 ug via INTRAVENOUS
  Administered 2023-08-08: 100 ug via INTRAVENOUS
  Filled 2023-08-05: qty 2
  Filled 2023-08-05: qty 1
  Filled 2023-08-05 (×2): qty 2

## 2023-08-05 MED ORDER — NOREPINEPHRINE 4 MG/250ML-% IV SOLN
0.0000 ug/min | INTRAVENOUS | Status: DC
  Administered 2023-08-05: 4 ug/min via INTRAVENOUS
  Filled 2023-08-05: qty 250

## 2023-08-05 MED ORDER — PHENYLEPHRINE HCL-NACL 20-0.9 MG/250ML-% IV SOLN
INTRAVENOUS | Status: DC | PRN
  Administered 2023-08-05: 25 ug/min via INTRAVENOUS

## 2023-08-05 MED ORDER — ASPIRIN 81 MG PO TBEC
81.0000 mg | DELAYED_RELEASE_TABLET | Freq: Every day | ORAL | Status: DC
  Administered 2023-08-05: 81 mg via ORAL
  Filled 2023-08-05: qty 1

## 2023-08-05 MED ORDER — DIPHENHYDRAMINE HCL 50 MG/ML IJ SOLN
25.0000 mg | Freq: Three times a day (TID) | INTRAMUSCULAR | Status: AC
  Administered 2023-08-05: 25 mg via INTRAVENOUS
  Filled 2023-08-05 (×2): qty 1

## 2023-08-05 MED ORDER — HEMOSTATIC AGENTS (NO CHARGE) OPTIME
TOPICAL | Status: DC | PRN
  Administered 2023-08-05: 1 via TOPICAL

## 2023-08-05 MED ORDER — INSULIN ASPART 100 UNIT/ML IJ SOLN
0.0000 [IU] | INTRAMUSCULAR | Status: DC
  Administered 2023-08-05: 1 [IU] via SUBCUTANEOUS
  Administered 2023-08-05: 2 [IU] via SUBCUTANEOUS
  Administered 2023-08-06 (×2): 1 [IU] via SUBCUTANEOUS
  Administered 2023-08-06 (×3): 2 [IU] via SUBCUTANEOUS
  Administered 2023-08-07: 1 [IU] via SUBCUTANEOUS
  Administered 2023-08-07 (×2): 2 [IU] via SUBCUTANEOUS
  Administered 2023-08-08: 1 [IU] via SUBCUTANEOUS
  Administered 2023-08-08: 2 [IU] via SUBCUTANEOUS
  Administered 2023-08-08 – 2023-08-09 (×3): 1 [IU] via SUBCUTANEOUS
  Administered 2023-08-09: 3 [IU] via SUBCUTANEOUS
  Administered 2023-08-09: 2 [IU] via SUBCUTANEOUS
  Administered 2023-08-09: 1 [IU] via SUBCUTANEOUS
  Administered 2023-08-10: 2 [IU] via SUBCUTANEOUS
  Administered 2023-08-10: 1 [IU] via SUBCUTANEOUS
  Administered 2023-08-10 – 2023-08-11 (×3): 2 [IU] via SUBCUTANEOUS
  Administered 2023-08-11 – 2023-08-12 (×4): 1 [IU] via SUBCUTANEOUS
  Administered 2023-08-13 (×4): 2 [IU] via SUBCUTANEOUS
  Administered 2023-08-13 (×2): 1 [IU] via SUBCUTANEOUS
  Administered 2023-08-14 – 2023-08-15 (×5): 2 [IU] via SUBCUTANEOUS
  Administered 2023-08-15: 1 [IU] via SUBCUTANEOUS
  Administered 2023-08-15: 2 [IU] via SUBCUTANEOUS
  Administered 2023-08-15: 1 [IU] via SUBCUTANEOUS
  Filled 2023-08-05 (×42): qty 1

## 2023-08-05 MED ORDER — KETAMINE HCL 10 MG/ML IJ SOLN
INTRAMUSCULAR | Status: DC | PRN
Start: 2023-08-05 — End: 2023-08-05
  Administered 2023-08-05: 30 mg via INTRAVENOUS
  Administered 2023-08-05 (×2): 10 mg via INTRAVENOUS

## 2023-08-05 MED ORDER — HEPARIN SODIUM (PORCINE) 5000 UNIT/ML IJ SOLN
INTRAMUSCULAR | Status: AC
  Filled 2023-08-05: qty 1

## 2023-08-05 MED ORDER — DEXMEDETOMIDINE HCL IN NACL 200 MCG/50ML IV SOLN
INTRAVENOUS | Status: DC | PRN
  Administered 2023-08-05: 4 ug via INTRAVENOUS
  Administered 2023-08-05: 8 ug via INTRAVENOUS

## 2023-08-05 MED ORDER — SUCCINYLCHOLINE CHLORIDE 200 MG/10ML IV SOSY
PREFILLED_SYRINGE | INTRAVENOUS | Status: DC | PRN
  Administered 2023-08-05: 100 mg via INTRAVENOUS

## 2023-08-05 MED ORDER — GLYCOPYRROLATE 0.2 MG/ML IJ SOLN
INTRAMUSCULAR | Status: DC | PRN
Start: 2023-08-05 — End: 2023-08-05
  Administered 2023-08-05: .2 mg via INTRAVENOUS

## 2023-08-05 MED ORDER — 0.9 % SODIUM CHLORIDE (POUR BTL) OPTIME: TOPICAL | Status: DC | PRN

## 2023-08-05 MED ORDER — PANTOPRAZOLE SODIUM 40 MG IV SOLR
40.0000 mg | Freq: Every day | INTRAVENOUS | Status: DC
  Administered 2023-08-05 – 2023-08-08 (×4): 40 mg via INTRAVENOUS
  Filled 2023-08-05 (×4): qty 10

## 2023-08-05 MED ORDER — ALBUMIN HUMAN 25 % IV SOLN
INTRAVENOUS | Status: AC
  Filled 2023-08-05: qty 50

## 2023-08-05 MED ORDER — BUPIVACAINE HCL (PF) 0.5 % IJ SOLN
INTRAMUSCULAR | Status: AC
  Filled 2023-08-05: qty 20

## 2023-08-05 MED ORDER — METHYLPREDNISOLONE SODIUM SUCC 125 MG IJ SOLR
125.0000 mg | Freq: Once | INTRAMUSCULAR | Status: AC
  Administered 2023-08-05: 125 mg via INTRAVENOUS
  Filled 2023-08-05: qty 2

## 2023-08-05 MED ORDER — VISTASEAL 4 ML SINGLE DOSE KIT
PACK | CUTANEOUS | Status: AC
  Filled 2023-08-05: qty 4

## 2023-08-05 MED ORDER — PROPOFOL 1000 MG/100ML IV EMUL
INTRAVENOUS | Status: AC
  Filled 2023-08-05: qty 200

## 2023-08-05 MED ORDER — EPINEPHRINE 1 MG/10ML IJ SOSY: PREFILLED_SYRINGE | INTRAMUSCULAR | Status: DC | PRN

## 2023-08-05 MED ORDER — EPINEPHRINE HCL 5 MG/250ML IV SOLN IN NS
0.5000 ug/min | INTRAVENOUS | Status: DC
  Filled 2023-08-05: qty 250

## 2023-08-05 MED ORDER — METHYLPREDNISOLONE SODIUM SUCC 40 MG IJ SOLR
40.0000 mg | INTRAMUSCULAR | Status: DC
  Administered 2023-08-06 – 2023-08-07 (×2): 40 mg via INTRAVENOUS
  Filled 2023-08-05 (×2): qty 1

## 2023-08-05 MED ORDER — BUPIVACAINE-EPINEPHRINE (PF) 0.5% -1:200000 IJ SOLN
INTRAMUSCULAR | Status: AC
  Filled 2023-08-05: qty 30

## 2023-08-05 MED ORDER — PROPOFOL 500 MG/50ML IV EMUL
INTRAVENOUS | Status: DC | PRN
  Administered 2023-08-05: 100 ug/kg/min via INTRAVENOUS

## 2023-08-05 MED ORDER — EPINEPHRINE HCL 5 MG/250ML IV SOLN IN NS
0.5000 ug/min | INTRAVENOUS | Status: DC
  Administered 2023-08-05 – 2023-08-06 (×2): 3 ug/min via INTRAVENOUS
  Filled 2023-08-05 (×4): qty 250

## 2023-08-05 MED ORDER — GUAIFENESIN-DM 100-10 MG/5ML PO SYRP: 15.0000 mL | ORAL_SOLUTION | ORAL | Status: DC | PRN

## 2023-08-05 MED ORDER — CHLORHEXIDINE GLUCONATE 0.12 % MT SOLN
OROMUCOSAL | Status: AC
  Filled 2023-08-05: qty 15

## 2023-08-05 MED ORDER — ONDANSETRON HCL 4 MG/2ML IJ SOLN
INTRAMUSCULAR | Status: DC | PRN
  Administered 2023-08-05: 4 mg via INTRAVENOUS

## 2023-08-05 MED ORDER — HEPARIN SODIUM (PORCINE) 1000 UNIT/ML DIALYSIS
1000.0000 [IU] | INTRAMUSCULAR | Status: DC | PRN
  Administered 2023-08-05 – 2023-08-08 (×2): 5200 [IU]

## 2023-08-05 MED ORDER — CEFAZOLIN SODIUM-DEXTROSE 2-4 GM/100ML-% IV SOLN
INTRAVENOUS | Status: AC
  Filled 2023-08-05: qty 100

## 2023-08-05 MED ORDER — ALBUMIN HUMAN 5 % IV SOLN
25.0000 g | Freq: Once | INTRAVENOUS | Status: AC
  Administered 2023-08-05: 25 g via INTRAVENOUS
  Filled 2023-08-05: qty 500

## 2023-08-05 MED ORDER — ONDANSETRON HCL 4 MG/2ML IJ SOLN: 4.0000 mg | Freq: Four times a day (QID) | INTRAMUSCULAR | Status: DC | PRN

## 2023-08-05 MED ORDER — POTASSIUM CHLORIDE CRYS ER 20 MEQ PO TBCR: 20.0000 meq | EXTENDED_RELEASE_TABLET | Freq: Once | ORAL | Status: DC | PRN

## 2023-08-05 MED ORDER — POLYSACCHARIDE IRON COMPLEX 150 MG PO CAPS
150.0000 mg | ORAL_CAPSULE | Freq: Every day | ORAL | Status: DC
  Administered 2023-08-05 – 2023-08-16 (×11): 150 mg via ORAL
  Filled 2023-08-05 (×12): qty 1

## 2023-08-05 MED ORDER — MIDAZOLAM HCL 2 MG/2ML IJ SOLN: 1.0000 mg | INTRAMUSCULAR | Status: DC | PRN

## 2023-08-05 MED ORDER — PHENOL 1.4 % MT LIQD: 1.0000 | OROMUCOSAL | Status: DC | PRN

## 2023-08-05 MED ORDER — EPINEPHRINE (ANAPHYLAXIS) 30 MG/30ML IJ SOLN
0.5000 ug/min | INTRAMUSCULAR | Status: DC
  Filled 2023-08-05: qty 10

## 2023-08-05 MED ORDER — MIDAZOLAM HCL 2 MG/2ML IJ SOLN
INTRAMUSCULAR | Status: AC
  Filled 2023-08-05: qty 2

## 2023-08-05 MED ORDER — VISTASEAL 4 ML SINGLE DOSE KIT
PACK | CUTANEOUS | Status: DC | PRN
  Administered 2023-08-05: 4 mL via TOPICAL

## 2023-08-05 MED ORDER — ROSUVASTATIN CALCIUM 10 MG PO TABS
40.0000 mg | ORAL_TABLET | Freq: Every day | ORAL | Status: DC
  Administered 2023-08-05 – 2023-08-16 (×12): 40 mg via ORAL
  Filled 2023-08-05 (×11): qty 4

## 2023-08-05 MED ORDER — DOCUSATE SODIUM 50 MG/5ML PO LIQD
100.0000 mg | Freq: Two times a day (BID) | ORAL | Status: DC
  Administered 2023-08-05 – 2023-08-09 (×7): 100 mg
  Filled 2023-08-05 (×7): qty 10

## 2023-08-05 MED ORDER — HEPARIN SODIUM (PORCINE) 1000 UNIT/ML IJ SOLN
INTRAMUSCULAR | Status: AC
  Filled 2023-08-05: qty 10

## 2023-08-05 MED ORDER — PANTOPRAZOLE SODIUM 40 MG PO TBEC: 40.0000 mg | DELAYED_RELEASE_TABLET | Freq: Every day | ORAL | Status: DC

## 2023-08-05 MED ORDER — AMIODARONE HCL 200 MG PO TABS
200.0000 mg | ORAL_TABLET | Freq: Two times a day (BID) | ORAL | Status: DC
  Administered 2023-08-05 – 2023-08-07 (×4): 200 mg via ORAL
  Filled 2023-08-05 (×4): qty 1

## 2023-08-05 MED ORDER — MIDAZOLAM HCL 2 MG/2ML IJ SOLN
INTRAMUSCULAR | Status: DC | PRN
Start: 2023-08-05 — End: 2023-08-05
  Administered 2023-08-05 (×2): 1 mg via INTRAVENOUS
  Administered 2023-08-05: 2 mg via INTRAVENOUS

## 2023-08-05 MED ORDER — PHENYLEPHRINE 80 MCG/ML (10ML) SYRINGE FOR IV PUSH (FOR BLOOD PRESSURE SUPPORT)
PREFILLED_SYRINGE | INTRAVENOUS | Status: DC | PRN
Start: 2023-08-05 — End: 2023-08-05
  Administered 2023-08-05: 80 ug via INTRAVENOUS
  Administered 2023-08-05: 160 ug via INTRAVENOUS

## 2023-08-05 MED ORDER — HEPARIN SOD (PORK) LOCK FLUSH 100 UNIT/ML IV SOLN
INTRAVENOUS | Status: AC
  Filled 2023-08-05: qty 5

## 2023-08-05 MED ORDER — SODIUM CHLORIDE 0.9 % IR SOLN: Status: DC | PRN

## 2023-08-05 MED ORDER — KETAMINE HCL 50 MG/5ML IJ SOSY
PREFILLED_SYRINGE | INTRAMUSCULAR | Status: AC
  Filled 2023-08-05: qty 5

## 2023-08-05 MED ORDER — HEPARIN SODIUM (PORCINE) 1000 UNIT/ML IJ SOLN
INTRAMUSCULAR | Status: DC | PRN
Start: 2023-08-05 — End: 2023-08-05
  Administered 2023-08-05: 3000 [IU] via INTRAVENOUS

## 2023-08-05 MED ORDER — DIPHENHYDRAMINE HCL 50 MG/ML IJ SOLN
INTRAMUSCULAR | Status: DC | PRN
  Administered 2023-08-05: 25 mg via INTRAVENOUS

## 2023-08-05 MED ORDER — FENTANYL CITRATE PF 50 MCG/ML IJ SOSY: 50.0000 ug | PREFILLED_SYRINGE | Freq: Once | INTRAMUSCULAR | Status: DC

## 2023-08-05 MED ORDER — LIDOCAINE HCL (PF) 1 % IJ SOLN
INTRAMUSCULAR | Status: AC
Start: 2023-08-05 — End: ?
  Filled 2023-08-05: qty 5

## 2023-08-05 MED ORDER — FENTANYL CITRATE (PF) 100 MCG/2ML IJ SOLN
INTRAMUSCULAR | Status: AC
  Filled 2023-08-05: qty 2

## 2023-08-05 MED ORDER — EPHEDRINE SULFATE (PRESSORS) 50 MG/ML IJ SOLN
INTRAMUSCULAR | Status: DC | PRN
Start: 2023-08-05 — End: 2023-08-05
  Administered 2023-08-05: 5 mg via INTRAVENOUS
  Administered 2023-08-05: 10 mg via INTRAVENOUS
  Administered 2023-08-05: 5 mg via INTRAVENOUS
  Administered 2023-08-05: 10 mg via INTRAVENOUS
  Administered 2023-08-05: 5 mg via INTRAVENOUS

## 2023-08-05 MED ORDER — OXYCODONE-ACETAMINOPHEN 10-325 MG PO TABS: 1.0000 | ORAL_TABLET | Freq: Four times a day (QID) | ORAL | 0 refills | Status: DC | PRN

## 2023-08-05 MED ORDER — CHLORHEXIDINE GLUCONATE CLOTH 2 % EX PADS
6.0000 | MEDICATED_PAD | Freq: Every day | CUTANEOUS | Status: DC
  Administered 2023-08-06 – 2023-08-16 (×8): 6 via TOPICAL

## 2023-08-05 MED ORDER — PROPOFOL 1000 MG/100ML IV EMUL
0.0000 ug/kg/min | INTRAVENOUS | Status: DC
  Administered 2023-08-05: 5 ug/kg/min via INTRAVENOUS
  Administered 2023-08-06: 25 ug/kg/min via INTRAVENOUS
  Administered 2023-08-06 – 2023-08-07 (×4): 20 ug/kg/min via INTRAVENOUS
  Administered 2023-08-08 (×2): 25 ug/kg/min via INTRAVENOUS
  Filled 2023-08-05 (×9): qty 100

## 2023-08-05 SURGICAL SUPPLY — 66 items
BAG DECANTER FOR FLEXI CONT (MISCELLANEOUS) ×4 IMPLANT
BALLOON DORADO 10X40X80 (BALLOONS) IMPLANT
BALLOON DORADO 8X100X80 (BALLOONS) IMPLANT
BLADE SURG SZ11 CARB STEEL (BLADE) ×4 IMPLANT
BRUSH SCRUB EZ 4% CHG (MISCELLANEOUS) ×4 IMPLANT
BUR OVAL 6.0 (BURR) IMPLANT
CATH BEACON 5 .035 40 KMP TP (CATHETERS) IMPLANT
CATH BEACON 5 .035 65 KMP TIP (CATHETERS) IMPLANT
CATH CANNON HEMO 15FR 19 (HEMODIALYSIS SUPPLIES) IMPLANT
CATH THOR STR 36F SOFT WA (CATHETERS) IMPLANT
CHLORAPREP W/TINT 26 (MISCELLANEOUS) ×4 IMPLANT
CLAMP SUTURE YELLOW 5 PAIRS (MISCELLANEOUS) ×4 IMPLANT
CNTNR URN SCR LID CUP LEK RST (MISCELLANEOUS) IMPLANT
COVER MAYO STAND STRL (DRAPES) IMPLANT
COVER PROBE FLX POLY STRL (MISCELLANEOUS) IMPLANT
DERMABOND ADVANCED .7 DNX12 (GAUZE/BANDAGES/DRESSINGS) ×4 IMPLANT
DEVICE PRESTO INFLATION (MISCELLANEOUS) IMPLANT
DRAPE C-ARM XRAY 36X54 (DRAPES) IMPLANT
DRAPE LAPAROTOMY 77X122 PED (DRAPES) IMPLANT
DRAPE SHEET LG 3/4 BI-LAMINATE (DRAPES) IMPLANT
DRESSING SURGICEL FIBRLLR 1X2 (HEMOSTASIS) ×4 IMPLANT
DRSG TEGADERM 4X4.75 (GAUZE/BANDAGES/DRESSINGS) IMPLANT
ELECT CAUTERY BLADE 6.4 (BLADE) ×4 IMPLANT
ELECTRODE REM PT RTRN 9FT ADLT (ELECTROSURGICAL) ×4 IMPLANT
GLIDEWIRE ADV .035X180CM (WIRE) IMPLANT
GLOVE BIO SURGEON STRL SZ7 (GLOVE) ×8 IMPLANT
GOWN STRL REUS W/ TWL LRG LVL3 (GOWN DISPOSABLE) ×8 IMPLANT
GOWN STRL REUS W/TWL 2XL LVL3 (GOWN DISPOSABLE) ×4 IMPLANT
GRADUATE 1200CC STRL 31836 (MISCELLANEOUS) IMPLANT
GRAFT COLLAGEN VASCULAR 7X45 (Vascular Products) IMPLANT
GUIDEWIRE STR ZIPWIRE 035X150 (MISCELLANEOUS) IMPLANT
IV NS 500ML BAXH (IV SOLUTION) ×4 IMPLANT
KIT CV MULTILUMEN 7FR 20 (SET/KITS/TRAYS/PACK) IMPLANT
KIT TURNOVER KIT A (KITS) ×4 IMPLANT
LABEL OR SOLS (LABEL) ×4 IMPLANT
LOOP VESSEL MAXI 1X406 RED (MISCELLANEOUS) ×4 IMPLANT
LOOP VESSEL MINI 0.8X406 BLUE (MISCELLANEOUS) ×4 IMPLANT
MANIFOLD NEPTUNE II (INSTRUMENTS) ×4 IMPLANT
NDL FILTER BLUNT 18X1 1/2 (NEEDLE) ×4 IMPLANT
NEEDLE FILTER BLUNT 18X1 1/2 (NEEDLE) IMPLANT
NS IRRIG 500ML POUR BTL (IV SOLUTION) ×4 IMPLANT
PACK EXTREMITY ARMC (MISCELLANEOUS) ×4 IMPLANT
PAD PREP OB/GYN DISP 24X41 (PERSONAL CARE ITEMS) ×4 IMPLANT
PENCIL SMOKE EVACUATOR (MISCELLANEOUS) ×4 IMPLANT
SHEATH BRITE TIP 6FRX11 (SHEATH) IMPLANT
SPIKE FLUID TRANSFER (MISCELLANEOUS) ×4 IMPLANT
STOCKINETTE 48X4 2 PLY STRL (GAUZE/BANDAGES/DRESSINGS) ×4 IMPLANT
STOCKINETTE STRL 4IN 9604848 (GAUZE/BANDAGES/DRESSINGS) ×4 IMPLANT
SUT GORETEX CV-6TTC-13 36IN (SUTURE) IMPLANT
SUT MNCRL AB 4-0 PS2 18 (SUTURE) ×4 IMPLANT
SUT PROLENE 0 CT 1 30 (SUTURE) IMPLANT
SUT PROLENE 5 0 RB 1 DA (SUTURE) IMPLANT
SUT PROLENE 6 0 BV (SUTURE) ×16 IMPLANT
SUT SILK 2 0 SH (SUTURE) ×4 IMPLANT
SUT SILK 2-0 18XBRD TIE 12 (SUTURE) ×4 IMPLANT
SUT SILK 3-0 18XBRD TIE 12 (SUTURE) ×4 IMPLANT
SUT SILK 4-0 18XBRD TIE 12 (SUTURE) ×4 IMPLANT
SUT VIC AB 3-0 SH 27X BRD (SUTURE) ×4 IMPLANT
SYR 20ML LL LF (SYRINGE) ×4 IMPLANT
SYR 3ML LL SCALE MARK (SYRINGE) ×4 IMPLANT
SYRINGE TOOMEY IRRIG 70ML (MISCELLANEOUS) IMPLANT
SYSTEM SAHARA CHEST DRAIN ATS (WOUND CARE) IMPLANT
TOWEL OR 17X26 4PK STRL BLUE (TOWEL DISPOSABLE) IMPLANT
TRAP FLUID SMOKE EVACUATOR (MISCELLANEOUS) ×4 IMPLANT
WATER STERILE IRR 500ML POUR (IV SOLUTION) ×4 IMPLANT
WIRE J 3MM .035X145CM (WIRE) IMPLANT

## 2023-08-05 NOTE — Op Note (Addendum)
 [facility]   OPERATIVE NOTE   PROCEDURE: Jump graft revision of left radiocephalic arteriovenous fistula with 6 mm Artegraft Ligation of left radiocephalic AV fistula  PRE-OPERATIVE DIAGNOSIS: 1. aneurysmal degeneration of arteriovenous fistula  2. ESRD  POST-OPERATIVE DIAGNOSIS: same as above   SURGEON: Mikki Alexander, MD  ASSISTANT(S): None  ANESTHESIA: General  ESTIMATED BLOOD LOSS: 10 cc  FINDING(S): Left radiocephalic aneurysm Palpable thrill at end of the case  SPECIMEN(S): None  INDICATIONS:   Adam Howell is a 63 y.o. male who  presents with aneurysmal degeneration of left radiocephalic arm arteriovenous access.  In order to salvage the fistula and decrease the bleeding complication risks, I recommended a jump graft revision with ligation of the access.  Risk, benefits, and alternatives to access surgery were discussed.  The patient is aware the risks include but are not limited to: bleeding, infection, steal syndrome, nerve damage, ischemic monomelic neuropathy, loss of the access, need for additional procedures, death and stroke.  The patient agrees to proceed forward with the procedure.  DESCRIPTION: After obtaining full informed written consent, the patient was brought back to the operating room and placed supine upon the operating table.  The patient received IV antibiotics prior to induction.  After obtaining adequate anesthesia, the patient was prepped and draped in the standard fashion for the access procedure.  As incision was created near the arterial anastomosis prior to the aneurysmal segment.  The access was encircled with vessel loops and prepared for control.  I then created an incision in the proximal arm beyond the aneurysmal segment and encircled the access there for control with a vessel loop.  I then used the tunneller and tunnelled between the two incisions around the old access.  I brought a 6 mm Artegraft through the tunneller  making sure to avoid twisting after marking for orientation.  The patient was then given 3000 units of intravenous heparin .  The access was then controlled and clamped and ligated distally with a silk suture ligature.  I prepared the end nearer the original arterial anastomosis for an anastomosis with the new 6 mm Artegraft.  The anastomosis was created in an end to end fashion with two 6-0 Prolene sutures in the typical fashion.  I then flushed through the new graft and prepared this for the distal anastomosis.  The access was then divided and ligated again with a silk suture ligature.  The distal end was then prepared for anastomosis with the new graft.  An anastomosis was then created with two 6-0 Prolene sutures in the usual fashion.  The graft was flushed and de-aired prior to release of control.  Patch sutures with 6-0 Prolene sutures were used as needed for control of bleeding.  Surgicel and Evicel topical hemostatic agents were placed and hemostasis was complete.  I then closed the wound with 3-0 Vicryl suture in the subcutaneous space and a 4-0 Monocryl suture was used to close the skin.  Dermabond was placed as a dressing.  I then turned my attention to the PermCath placement which will be dictated as a separate procedure.  COMPLICATIONS: none  CONDITION: stable  Mikki Alexander  08/05/2023, 12:28 PM   This note was created with Dragon Medical transcription system. Any errors in dictation are purely unintentional.

## 2023-08-05 NOTE — Progress Notes (Signed)
 HD Note:  Some information was entered later than the data was gathered due to patient care needs. The stated time with the data is accurate.  Received patient in bed to unit.   Alert.  On vent.  Responds to voice  Informed consent signed and in chart.   Access used: Right Femoral TDC.  Dressing dry and intact.  CVC heparin  locked.  Access issues: None  Patient tolerated treatment well.   TX duration: 3 hours  Alert, without acute distress noted..  Hypotensive during treatment .  Albumin  and Pressors started by ICU nurse.  MAP stable but BP remains soft.   Total UF removed: 0 as ordered.   Hand-off given to patient's nurse.    Barron Lien, RN [facility] Unit.

## 2023-08-05 NOTE — Anesthesia Preprocedure Evaluation (Addendum)
 Anesthesia Evaluation  Patient identified by MRN, date of birth, ID band Patient awake    Reviewed: Allergy & Precautions, NPO status , Patient's Chart, lab work & pertinent test results  History of Anesthesia Complications (+) DIFFICULT AIRWAY and history of anesthetic complications  Airway Mallampati: III  TM Distance: >3 FB Neck ROM: full    Dental  (+) Chipped, Poor Dentition, Missing   Pulmonary neg pulmonary ROS, neg shortness of breath   Pulmonary exam normal        Cardiovascular Exercise Tolerance: Good hypertension, + CAD, + Peripheral Vascular Disease and +CHF  + dysrhythmias Atrial Fibrillation  Rhythm:irregular Rate:Normal     Neuro/Psych negative neurological ROS  negative psych ROS   GI/Hepatic negative GI ROS, Neg liver ROS,,,  Endo/Other  diabetes, Type 2    Renal/GU Renal disease  negative genitourinary   Musculoskeletal   Abdominal   Peds  Hematology negative hematology ROS (+)   Anesthesia Other Findings Past Medical History: No date: Anemia in chronic kidney disease No date: Cellulitis of left lower extremity No date: Chronic atrial fibrillation (HCC) No date: Chronic kidney disease No date: Congestive heart failure (HCC) No date: Difficult intubation No date: End-stage renal disease on hemodialysis (HCC) No date: Hyperlipidemia No date: Hypertension No date: Type 2 diabetes mellitus with complication [facility])  Past Surgical History: 08/27/2022: A/V FISTULAGRAM; Left     Comment:  Procedure: A/V Fistulagram;  Surgeon: Celso College, MD;               Location: [facility];  Service: Cardiovascular;              Laterality: Left; 03/13/2022: AMPUTATION; Right     Comment:  Procedure: AMPUTATION ABOVE KNEE;  Surgeon: Celso College, MD;  Location: [facility] ORS;  Service: General;                Laterality: Right; 03/07/2022: APPLICATION OF WOUND VAC; Right     Comment:   Procedure: APPLICATION OF WOUND VAC;  Surgeon: Lesta Rater, MD;  Location: [facility] ORS;  Service: Vascular;                Laterality: Right;  GAAC [id number] 02/15/2023: LOWER EXTREMITY ANGIOGRAPHY; Left     Comment:  Procedure: Lower Extremity Angiography;  Surgeon: Celso College, MD;  Location: [facility];  Service:               Cardiovascular;  Laterality: Left; No date: WOUND DEBRIDEMENT; Left 01/16/2022: WOUND DEBRIDEMENT; Right     Comment:  Procedure: DEBRIDEMENT WOUND;  Surgeon: Jackquelyn Mass, MD;  Location: [facility] ORS;  Service: Vascular;                Laterality: Right;  Wound vac placement 03/07/2022: WOUND DEBRIDEMENT; Right     Comment:  Procedure: DEBRIDEMENT WOUND RIGHT LOWER EXTREMITY;                Surgeon: Lesta Rater, MD;  Location: [facility] ORS;                Service: Vascular;  Laterality: Right;  BMI  Body Mass Index: 30.52 kg/m      Reproductive/Obstetrics negative OB ROS                             Anesthesia Physical Anesthesia Plan  ASA: 4  Anesthesia Plan: General   Post-op Pain Management: Regional block*   Induction: Intravenous  PONV Risk Score and Plan: Propofol  infusion and TIVA  Airway Management Planned: Natural Airway and Nasal Cannula  Additional Equipment:   Intra-op Plan:   Post-operative Plan:   Informed Consent: I have reviewed the patients History and Physical, chart, labs and discussed the procedure including the risks, benefits and alternatives for the proposed anesthesia with the patient or authorized representative who has indicated his/her understanding and acceptance.     Dental Advisory Given  Plan Discussed with: Anesthesiologist, CRNA and Surgeon  Anesthesia Plan Comments: (Patient consented for risks of anesthesia including but not limited to:  - adverse reactions to medications - risk of airway placement if required - damage to  eyes, teeth, lips or other oral mucosa - nerve damage due to positioning  - sore throat or hoarseness - Damage to heart, brain, nerves, lungs, other parts of body or loss of life  Patient voiced understanding and assent.)       Anesthesia Quick Evaluation

## 2023-08-05 NOTE — Op Note (Signed)
 OPERATIVE NOTE    PRE-OPERATIVE DIAGNOSIS: 1. ESRD 2.  Moderately revised left radiocephalic AV fistula not yet ready to be used  POST-OPERATIVE DIAGNOSIS: same as above  PROCEDURE: Ultrasound guidance for vascular access to the right internal jugular vein Fluoroscopic guidance for placement of catheter Placement of a 19 cm tip to cuff tunneled hemodialysis catheter via the right internal jugular vein Right jugular venogram and superior venacavogram Ultrasound guidance for vascular access right femoral vein Catheter placement into right innominate vein from right femoral vein Percutaneous transluminal angioplasty of right innominate vein and superior vena cava with 8 and 10 mm diameter angioplasty balloons Removal of right jugular PermCath Placement of right femoral PermCath, 44 cm tip to cuff with ultrasound and fluoroscopic guidance Placement of left femoral triple-lumen catheter for venous access  SURGEON: Mikki Alexander, MD  ANESTHESIA: General  ESTIMATED BLOOD LOSS: 50 cc  CONTRAST: 40 cc  FINDING(S): 1.  Patent right internal jugular vein, stenosis of the right innominate vein and superior vena cava. 2.  Bleeding from the right innominate vein after PermCath placement.  We then remove the right jugular PermCath and treated this with angioplasty from a right femoral approach before placing a right femoral PermCath.  SPECIMEN(S):  None  INDICATIONS:   Adam Howell is a 63 y.o.male who presents with an aneurysmal AV fistula requiring surgical revision he will need a catheter for a while while this heals.  The patient needs long term dialysis access for their ESRD, and a Permcath is necessary.  Risks and benefits are discussed and informed consent is obtained.    DESCRIPTION: After obtaining full informed written consent, the patient was brought back to the vascular suited. The patient's right neck and chest were sterilely prepped and draped in a sterile surgical field was  created. Moderate conscious sedation was administered during a face to face encounter with the patient throughout the procedure with my supervision of the RN administering medicines and monitoring the patient's vital signs, pulse oximetry, telemetry and mental status throughout from the start of the procedure until the patient was taken to the recovery room.  The right internal jugular vein was visualized with ultrasound and found to be patent. It was then accessed under direct ultrasound guidance and a permanent image was recorded. A wire was placed but did not easily track centrally. After skin nick and dilatation, the peel-away sheath was placed over the wire. I then turned my attention to an area under the clavicle. Approximately 1-2 fingerbreadths below the clavicle a small counterincision was created and tunneled from the subclavicular incision to the access site. Using fluoroscopic guidance, a 19 centimeter tip to cuff tunneled hemodialysis catheter was selected, and tunneled from the subclavicular incision to the access site. It was then placed through the peel-away sheath and the peel-away sheath was removed. Using fluoroscopic guidance the catheter tips were parked in the right atrium. The appropriate distal connectors were placed. It withdrew blood well and flushed with heparinized saline and a concentrated heparin  solution was then placed.  There was however fairly poor withdrawal and flushing.  Imaging was then performed through the PermCath with this pulled back and there was a clear stenosis of the right innominate vein and superior vena cava.  The catheter was then parked beyond this.  It was secured to the chest wall with 2 Prolene sutures. The access incision was closed single 4-0 Monocryl. A 4-0 Monocryl pursestring suture was placed around the exit site.  At this point, the  patient became hypotensive and it was not entirely clear if this was a contrast reaction or bleeding from the diseased  central venous system.  Anesthesiology appropriately supported his airway and blood pressure.  We then accessed the right femoral vein with direct ultrasound guidance without difficulty with Seldinger needle.  A J-wire and 6 French sheath were then placed.  A Kumpe catheter and J-wire were then placed up through the right atrium and into the right innominate vein and actually right subclavian vein.  There did not appear to be obvious extravasation, but clearly there appeared to be some blood in the pleural cavity worrisome for hemothorax.  I elected to remove the right jugular catheter and perform balloon angioplasty of the right innominate vein and superior vena cava.  I initially did this with an 8 mm balloon, but this was undersized and upsized to a 10 mm balloon and held this up for approximately 10 minutes.  The right jugular PermCath was then removed with gentle traction and pressure was held at the site. After leaving the balloon up for 10 minutes, another image was performed showing flow through the right innominate vein and superior vena cava.  There is no obvious extravasation, and the patient was now more hemodynamically stable although he was still requiring pressors.  There was no sign of further bleeding at this point.  Consideration was given for placing a chest tube, but I felt this may result in increased bleeding as the pressure from the existing hematoma would help tamponade any further venous bleeding and if we remove this into the cavity further bleeding would likely ensue.  Also of note, the patient had been on Eliquis  up till yesterday and had received heparin  during the revision of his fistula.  I elected not to place a chest tube for this reason. After completion imaging showed no further bleeding in the right innominate vein or superior vena cava, I placed a peel-away sheath over a J-wire in the right femoral vein.  A counterincision was made some 5 cm inferior and lateral to the access  site.  I then selected a 44 cm tip to cuff tunneled hemodialysis catheter and tunneled this from the counterincision to the access site.  Was then placed through the peel-away sheath and the peel-away sheath was removed.  The catheter tips were parked in the retrohepatic vena cava.  It withdrew blood well and flushed easily with sterile saline and a concentrated heparin  solution was then placed.  It was secured to the leg with Prolene sutures and the skin incision was closed with 4-0 Monocryl. At this point, as the patient was on pressors we placed a central line.  Obviously with the issues in the innominate vein and superior vena cava, a left femoral triple-lumen catheter was the best option.  The left femoral vein was visualized with ultrasound and found to be widely patent.  It was then accessed under direct ultrasound guidance without difficulty with a Seldinger needle and a permanent image was recorded.  After skin nick and dilatation a triple-lumen catheter was placed over the wire and the wire was removed.  All 3 lm withdrew dark red nonpulsatile blood and flushed easily with sterile saline.  It was then secured to the skin with 2 silk sutures.  Sterile dressing was placed. The patient tolerated the procedure well and was taken to the ICU in stable condition.   COMPLICATIONS: None  CONDITION: Stable  Mikki Alexander, MD 08/05/2023 3:26 PM   This note was  created with Dragon Medical transcription system. Any errors in dictation are purely unintentional.

## 2023-08-05 NOTE — Plan of Care (Signed)
  Problem: Activity: Goal: Ability to tolerate increased activity will improve Outcome: Progressing   Problem: Respiratory: Goal: Ability to maintain a clear airway and adequate ventilation will improve Outcome: Progressing   Problem: Role Relationship: Goal: Method of communication will improve Outcome: Progressing   Problem: Education: Goal: Knowledge of General Education information will improve Description: Including pain rating scale, medication(s)/side effects and non-pharmacologic comfort measures Outcome: Progressing   Problem: Health Behavior/Discharge Planning: Goal: Ability to manage health-related needs will improve Outcome: Progressing   Problem: Clinical Measurements: Goal: Ability to maintain clinical measurements within normal limits will improve Outcome: Progressing Goal: Will remain free from infection Outcome: Progressing Goal: Diagnostic test results will improve Outcome: Progressing Goal: Respiratory complications will improve Outcome: Progressing Goal: Cardiovascular complication will be avoided Outcome: Progressing   Problem: Activity: Goal: Risk for activity intolerance will decrease Outcome: Progressing   Problem: Nutrition: Goal: Adequate nutrition will be maintained Outcome: Progressing   Problem: Coping: Goal: Level of anxiety will decrease Outcome: Progressing   Problem: Elimination: Goal: Will not experience complications related to bowel motility Outcome: Progressing Goal: Will not experience complications related to urinary retention Outcome: Progressing   Problem: Pain Managment: Goal: General experience of comfort will improve and/or be controlled Outcome: Progressing   Problem: Safety: Goal: Ability to remain free from injury will improve Outcome: Progressing   Problem: Skin Integrity: Goal: Risk for impaired skin integrity will decrease Outcome: Progressing   Problem: Education: Goal: Ability to describe self-care  measures that may prevent or decrease complications (Diabetes Survival Skills Education) will improve Outcome: Progressing Goal: Individualized Educational Video(s) Outcome: Progressing   Problem: Fluid Volume: Goal: Ability to maintain a balanced intake and output will improve Outcome: Progressing   Problem: Coping: Goal: Ability to adjust to condition or change in health will improve Outcome: Progressing   Problem: Health Behavior/Discharge Planning: Goal: Ability to identify and utilize available resources and services will improve Outcome: Progressing Goal: Ability to manage health-related needs will improve Outcome: Progressing   Problem: Metabolic: Goal: Ability to maintain appropriate glucose levels will improve Outcome: Progressing   Problem: Nutritional: Goal: Maintenance of adequate nutrition will improve Outcome: Progressing Goal: Progress toward achieving an optimal weight will improve Outcome: Progressing   Problem: Skin Integrity: Goal: Risk for impaired skin integrity will decrease Outcome: Progressing   Problem: Tissue Perfusion: Goal: Adequacy of tissue perfusion will improve Outcome: Progressing

## 2023-08-05 NOTE — Anesthesia Procedure Notes (Signed)
 Anesthesia Regional Block: Supraclavicular block   Pre-Anesthetic Checklist: , timeout performed,  Correct Patient, Correct Site, Correct Laterality,  Correct Procedure, Correct Position, site marked,  Risks and benefits discussed,  Surgical consent,  Pre-op evaluation,  At surgeon's request and post-op pain management  Laterality: Upper and Left  Prep: chloraprep       Needles:  Injection technique: Single-shot  Needle Type: Stimiplex     Needle Length: 9cm  Needle Gauge: 22     Additional Needles:   Procedures:,,,, ultrasound used (permanent image in chart),,    Narrative:  Start time: 08/05/2023 7:39 AM End time: 08/05/2023 7:40 AM Injection made incrementally with aspirations every 5 mL.  Performed by: Personally   Additional Notes: Patient consented for risk and benefits of nerve block including but not limited to nerve damage, Horner's syndrome, failed block, bleeding and infection.  Patient voiced understanding.  Functioning IV was confirmed and monitors were applied.  Timeout done prior to procedure and prior to any sedation being given to the patient.  Patient confirmed procedure site prior to any sedation given to the patient.  A 50mm 22ga Stimuplex needle was used. Sterile prep,hand hygiene and sterile gloves were used.  Minimal sedation used for procedure.  No paresthesia endorsed by patient during the procedure.  Negative aspiration and negative test dose prior to incremental administration of local anesthetic. The patient tolerated the procedure well with no immediate complications.

## 2023-08-05 NOTE — Progress Notes (Signed)
 [facility]  ROUNDING NOTE   Subjective:   Adam Howell is a 63 y.o male with past medical history of diabetes, hypertension, CAD, Rt AKA, anemia, CHF, and ESRD on dialysis. Patient presents to vascular surgery for revision of AVF. He is being admitted for End stage renal disease [facility]) [N18.6]  Patient known to our practice and receives outpatient dialysis treatments at [facility] on a MWF schedule, supervised by [facility] physicians. It was reported that patient expereiced bleeding during permcath insertion, then suffered an anaphylactic reaction to contrast.   Patient now seen and evaluated at bedside in ICU. Patient currently intubated on ventilator.  Levophed and Precedex  infusing.  Nursing at bedside inserting NG tube.  Patient ill-appearing.  HD temp cath placed in right groin.  Labs today concerning for potassium 5.5.  Hemoglobin 11.9 from 14.5.  We have been consulted to maintain dialysis needs during this admission.   Objective:  Vital signs in last 24 hours:  Temp:  [97 F (36.1 C)-98.6 F (37 C)] 97 F (36.1 C) (05/22 1400) Pulse Rate:  [55-83] 83 (05/22 1230) Resp:  [14-19] 16 (05/22 1400) BP: (92-209)/(63-159) 145/97 (05/22 1400) SpO2:  [100 %] 100 % (05/22 1245) FiO2 (%):  [60 %] 60 % (05/22 1228) Weight:  [102.1 kg] 102.1 kg (05/22 0627)  Weight change:  Filed Weights   08/05/23 0627  Weight: 102.1 kg    Intake/Output: No intake/output data recorded.   Intake/Output this shift:  Total I/O In: 910 [I.V.:800; IV Piggyback:110] Out: -   Physical Exam: General: Ill-appearing  Head: Normocephalic, atraumatic.   Eyes: Anicteric  Lungs:  Intubated on ventilator  Heart: Regular rate and rhythm  Abdomen:  Soft, nontender, obese  Extremities: No peripheral edema.  Neurologic: Sedated  Skin: No lesions  Access: Left aVF (no bruit or thrill) right HD femoral temp cath    Basic Metabolic Panel: Recent Labs  Lab 08/05/23 0654  08/05/23 1305  NA 135  136 133*  K 4.2  4.2 5.5*  CL 93*  99 95*  CO2 30 26  GLUCOSE 102*  102* 243*  BUN 32*  31* 34*  CREATININE 8.47*  8.40* 8.72*  CALCIUM  9.1 8.1*    Liver Function Tests: Recent Labs  Lab 08/05/23 1305  AST 17  ALT 17  ALKPHOS 84  BILITOT 0.8  PROT 6.5  ALBUMIN  3.2*   No results for input(s): "LIPASE", "AMYLASE" in the last 168 hours. No results for input(s): "AMMONIA" in the last 168 hours.  CBC: Recent Labs  Lab 08/05/23 0654 08/05/23 1305  WBC 3.8* 15.0*  NEUTROABS 1.7 12.6*  HGB 14.5  16.3 11.9*  HCT 45.4  48.0 37.1*  MCV 88.2 88.5  PLT 108* 160    Cardiac Enzymes: No results for input(s): "CKTOTAL", "CKMB", "CKMBINDEX", "TROPONINI" in the last 168 hours.  BNP: Invalid input(s): "POCBNP"  CBG: Recent Labs  Lab 08/05/23 1222  GLUCAP 86    Microbiology: Results for orders placed or performed in visit on 07/27/23  CULTURE, URINE COMPREHENSIVE     Status: Abnormal   Collection Time: 07/27/23  2:28 PM   Specimen: Urine   UR  Result Value Ref Range Status   Urine Culture, Comprehensive Final report (A)  Final   Organism ID, Bacteria Comment (A)  Final    Comment: Escherichia coli, identified by an automated biochemical system. Cefazolin  with an MIC <=16 predicts susceptibility to the oral agents cefaclor, cefdinir, cefpodoxime, cefprozil, cefuroxime, cephalexin , and loracarbef when  used for therapy of uncomplicated urinary tract infections due to E. coli, Klebsiella pneumoniae, and Proteus mirabilis. Greater than 100,000 colony forming units per mL    Organism ID, Bacteria Comment  Final    Comment: Mixed urogenital flora 25,000-50,000 colony forming units per mL    ANTIMICROBIAL SUSCEPTIBILITY Comment  Final    Comment:       ** S = Susceptible; I = Intermediate; R = Resistant **                    P = Positive; N = Negative             MICS are expressed in micrograms per mL    Antibiotic                 RSLT#1     RSLT#2    RSLT#3    RSLT#4 Amoxicillin /Clavulanic Acid    S Ampicillin                      S Cefazolin                       S Cefepime                        S Cefoxitin                      S Cefpodoxime                    S Ceftriaxone                     S Ciprofloxacin                  R Ertapenem                      S Gentamicin                     S Levofloxacin                   R Meropenem                       S Nitrofurantoin                 S Piperacillin /Tazobactam        S Tetracycline                   S Tobramycin                     S Trimethoprim /Sulfa              S     Coagulation Studies: Recent Labs    08/05/23 1305  LABPROT 16.8*  INR 1.3*    Urinalysis: No results for input(s): "COLORURINE", "LABSPEC", "PHURINE", "GLUCOSEU", "HGBUR", "BILIRUBINUR", "KETONESUR", "PROTEINUR", "UROBILINOGEN", "NITRITE", "LEUKOCYTESUR" in the last 72 hours.  Invalid input(s): "APPERANCEUR"    Imaging: DG C-Arm 1-60 Min-No Report Result Date: 08/05/2023 Fluoroscopy was utilized by the requesting physician.  No radiographic interpretation.   DG C-Arm 1-60 Min-No Report Result Date: 08/05/2023 Fluoroscopy was utilized by the requesting physician.  No radiographic interpretation.   DG C-Arm 1-60 Min-No Report Result Date: 08/05/2023 Fluoroscopy was utilized by the requesting physician.  No radiographic interpretation.   US  OR  NERVE BLOCK-IMAGE ONLY [facility]) Result Date: 08/05/2023 There is no interpretation for this exam.  This order is for images obtained during a surgical procedure.  Please See "Surgeries" Tab for more information regarding the procedure.     Medications:    epinephrine  6 mcg/min (08/05/23 1134)   propofol  (DIPRIVAN ) infusion      diphenhydrAMINE   25 mg Intravenous Q8H   docusate  100 mg Per Tube BID   hydrocerin   Topical Daily   insulin  aspart  0-9 Units Subcutaneous Q4H   methylPREDNISolone  (SOLU-MEDROL ) injection  125 mg Intravenous Once    [START ON 08/06/2023] methylPREDNISolone  (SOLU-MEDROL ) injection  40 mg Intravenous Q24H   pantoprazole (PROTONIX) IV  40 mg Intravenous Daily   polyethylene glycol  17 g Per Tube Daily   fentaNYL  (SUBLIMAZE ) injection, fentaNYL  (SUBLIMAZE ) injection, HYDROmorphone  (DILAUDID ) injection, ondansetron  (ZOFRAN ) IV  Assessment/ Plan:  Mr. Koron Godeaux is a 63 y.o.  male with past medical history of diabetes, hypertension, CAD, Rt AKA, anemia, CHF, and ESRD on dialysis. Patient presents to vascular surgery for revision of AVF. He is being admitted for End stage renal disease (HCC) [N18.6]  [facility] [place]/MWF/left aVF  End-stage renal disease with hyperkalemia on hemodialysis. Will receive treatment later today to manage potassium.  2. Anemia of chronic kidney disease Lab Results  Component Value Date   HGB 11.9 (L) 08/05/2023    Hgb within optimal range  No need for ESA's at this time.   3. Secondary Hyperparathyroidism: with outpatient labs: PTH 410, phosphorus 4.7, calcium  8.8 on 07/19/23.    Lab Results  Component Value Date   CALCIUM  8.1 (L) 08/05/2023   CAION 1.06 (L) 08/05/2023   PHOS 3.0 04/14/2023    Will continue to monitor bone minerals.      LOS: 0 Ozell Ferrera 5/22/20252:23 PM

## 2023-08-05 NOTE — Consult Note (Addendum)
 WOC Nurse Consult Note: Reason for Consult: Requested to assess a left leg wound. Performed remotely after photos and notes.  Wound type: Cellulitis on left wound extremities and Diabetic foot ulcer, lateral anterior. Pressure Injury POA: NA Measurement: Left leg - aprox. 5 x 3.5 cm.  Wound bed: 100% red, partial healed. Drainage (amount, consistency, odor) Minimum amount according to the characteristics of wound bed. Periwound: intact, new tissue healed. Dressing procedure/placement/frequency: Left Leg - Apply a single layer of Xeroform to the wound bed and cover with gauze or foam dressing. Use Eucerin lotion on the skin, before apply the wrap. Wrap the leg with Kerlix and Stretch bandage (ACE wrap). Beginning on the base of toes, going to the base of the knees. Change daily.  Measurement: DFU lateral on left foot - aprox. 1 x 1 cm.  Wound bed: 50% yellow, 50% red. Drainage (amount, consistency, odor) Moderate amount according to peri-wound maceration. Periwound: keratosis, maceration. Dressing procedure/placement/frequency: Apply a single layer of Xeroform to the wound bed and cover with gauze or foam dressing.  WOC team will not plan to follow further.  Please reconsult if further assistance is needed. Thank-you,  Rachel Budds BSN, RN, [employer], WOC  (Pager: [phone])

## 2023-08-05 NOTE — Consult Note (Signed)
 NAME:  Adam Howell, MRN:  [id number], DOB:  December 21, 1960, LOS: 0 ADMISSION DATE:  08/05/2023, CONSULTATION DATE:  08/05/2023 REFERRING MD:  Dr. Vonna Guardian, CHIEF COMPLAINT:  Shock, post-op vent managment   Brief Pt Description / Synopsis:  63 year old male with past medical history significant for ESRD on HD, HFrEF, hypertension, hyperlipidemia presenting for elective AV fistula revision.  PermCath placement complicated by bleeding, concern for potential anaphylactic reaction to IV contrast, and shock.  Returns to ICU postprocedure and remains intubated.  History of Present Illness:  Adam Howell is a 64 y.o male with past medical history significant for ESRD on HD, HFrEF, HTN, HLD who presented to [facility] on 08/05/23 for elective revision of his AV fistula as is was noted to be quite aneurysmal with scabs and impending skin threat putting it at high risk for bleeding.  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  The patient became hypotensive with SBP in the 60's concerning for bleeding in the diseased central venous system verses possible IV contrast reaction.  Fluroscopy did show some blood in the pleural cavity concerning for hemothorax.     Femoral temporary Dialysis catheter was placed along with femoral central line placement. Consideration was given for placing a chest tube, but it was felt this may result in increased bleeding as the pressure from the existing hematoma would help tamponade any further venous bleeding and if we remove this into the cavity further bleeding would likely ensue. Also of note, the patient had been on Eliquis  up till yesterday and had received heparin  during the revision of his fistula.     Pt remained intubated post procedure requiring vasopressors, and was transferred to ICU.  PCCM consulted for further workup and treatment.  Please see "Significant Hospital Events"  section below for full detailed hospital course.   Pertinent  Medical History   Past Medical History:  Diagnosis Date   Anemia in chronic kidney disease    Cellulitis of left lower extremity    Chronic atrial fibrillation (HCC)    Chronic kidney disease    Congestive heart failure (HCC)    Difficult intubation    End-stage renal disease on hemodialysis (HCC)    Hyperlipidemia    Hypertension    Type 2 diabetes mellitus with complication (HCC)     Micro Data:  5/22: MRSA PCR>>negative  Antimicrobials:   Anti-infectives (From admission, onward)    Start     Dose/Rate Route Frequency Ordered Stop   08/05/23 0600  ceFAZolin  (ANCEF ) IVPB 2g/100 mL premix        2 g 200 mL/hr over 30 Minutes Intravenous On call to O.R. 08/04/23 2248 08/05/23 1143       Significant Hospital Events: Including procedures, antibiotic start and stop dates in addition to other pertinent events   5/22: Presented for elective AV fistula revision.  During Permcath placement, concern for bleeding and hemothorax.  Also with concern for anaphylaxis with anaphylactic shock following IV contrast requiring Epi gtt.  Returns to ICU post procedure and remains intubated.  PCCM consulted.  Nephrology consulted for HD.  Interim History / Subjective:  As outlined above under "Significant Hospital Events" section  Objective    Blood pressure 103/72, pulse (!) 55, temperature 98.6 F (37 C), temperature source Temporal, resp. rate 17, height 6' (1.829 m), weight 102.1 kg, SpO2 100%.    Vent Mode: PRVC FiO2 (%):  [60 %]  60 % Set Rate:  [16 bmp] 16 bmp Vt Set:  [550 mL] 550 mL PEEP:  [8 cmH20] 8 cmH20 Plateau Pressure:  [21 cmH20] 21 cmH20   Intake/Output Summary (Last 24 hours) at 08/05/2023 1236 Last data filed at 08/05/2023 1140 Gross per 24 hour  Intake 610 ml  Output --  Net 610 ml   Filed Weights   08/05/23 0627  Weight: 102.1 kg    Examination: General: Critically ill-appearing male, laying in  bed, intubated and sedated, no acute distress HENT: Atraumatic, normocephalic, neck supple, difficult to assess JVD due to body habitus Lungs: Mechanical breath sounds throughout, even, nonlabored, synchronous with the vent Cardiovascular: Irregular irregular rhythm, rate controlled, no murmurs, rubs, gallops Abdomen: Obese, soft, nontender, nondistended, no guarding or rebound tenderness, bowel sounds positive x 4 Extremities: Right BKA, chronic wounds to left shin (see images below) Neuro: Sedated, currently does not withdraw from pain or follow commands, pupils PERRLA GU: Deferred           Resolved Hospital Problem list     Assessment & Plan:   #Shock: Anaphylactic vs Hemorrhagic #Mildly Elevated Troponin, suspect demand ischemia #Chronic Atrial Fibrillation #Chronic HFrEF PMHx: CHF,HTN, HLD Echocardiogram 01/10/20: LVEF 35-40%, unable to evaluate diastolic function, RV systolic function low normal, mildly elevated pulmonary artery systolic pressure -Continuous cardiac monitoring -Maintain MAP >65 -Cautious IV fluids given ESRD -Transfusions as indicated -Vasopressors as needed to maintain MAP goal ~ Epi gtt -Trend lactic acid until normalized -HS Troponin flat at 76 -Consider Echocardiogram  -Volume removal with HD -Hold home Eliquis  for now given concern for possible hemothorax  -Hold home Coreg  for now  #Suspected Anaphylactic Reaction due to IV contrast -Epi gtt -IV Steroids -IV Benadryl  -Avoid further IV contrast  #Post-Op Ventilator Management #Concern for possible Hemothorax -Full vent support, implement lung protective strategies -Plateau pressures less than 30 cm H20 -Wean FiO2 & PEEP as tolerated to maintain O2 sats >92% -Follow intermittent Chest X-ray & ABG as needed -Spontaneous Breathing Trials when respiratory parameters met and mental status permits -Implement VAP Bundle -Bronchodilators -IV steroids -CT Chest is pending -Discussed with  Dr. Aleskerov, avoid placing chest tube at this time  #ESRD on HD #Hyperkalemia #Mild Hyponatremia #AV Fistula revision -Monitor I&O's / urinary output -Follow BMP -Ensure adequate renal perfusion -Avoid nephrotoxic agents as able -Replace electrolytes as indicated ~ Pharmacy following for assistance with electrolyte replacement -Nephrology consulted, appreciate input ~ HD as per Nephrology  #Acute Blood Loss Anemia due to suspected Hemothorax -Monitor for S/Sx of bleeding -Trend CBC -SCD's for VTE Prophylaxis (hold DVT ppx/home Eliquis  for now) -Transfuse for Hgb <7  #Diabetes Mellitus -CBG's q4h; Target range of 140 to 180 -SSI -Follow ICU Hypo/Hyperglycemia protocol  #Sedation needs in setting of mechanical ventilation -Maintain a RASS goal of 0 to -1 -Fentanyl  and Propofol  as needed to maintain RASS goal -Avoid sedating medications as able -Daily wake up assessment      Best Practice (right click and "Reselect all SmartList Selections" daily)   Diet/type: NPO DVT prophylaxis: SCD GI prophylaxis: PPI Lines: Central line, Dialysis Catheter, and yes and it is still needed Foley:  N/A Code Status:  full code Last date of multidisciplinary goals of care discussion [N/A]  5/22: Will update pt's family when they arrive at bedside on plan of care.  Labs   CBC: Recent Labs  Lab 08/05/23 0654  WBC 3.8*  NEUTROABS 1.7  HGB 14.5  16.3  HCT 45.4  48.0  MCV  88.2  PLT 108*    Basic Metabolic Panel: Recent Labs  Lab 08/05/23 0654  NA 135  136  K 4.2  4.2  CL 93*  99  CO2 30  GLUCOSE 102*  102*  BUN 32*  31*  CREATININE 8.47*  8.40*  CALCIUM  9.1   GFR: Estimated Creatinine Clearance: 11.2 mL/min (A) (by C-G formula based on SCr of 8.47 mg/dL (H)). Recent Labs  Lab 08/05/23 0654  WBC 3.8*    Liver Function Tests: No results for input(s): "AST", "ALT", "ALKPHOS", "BILITOT", "PROT", "ALBUMIN " in the last 168 hours. No results for input(s):  "LIPASE", "AMYLASE" in the last 168 hours. No results for input(s): "AMMONIA" in the last 168 hours.  ABG    Component Value Date/Time   PHART 7.26 (L) 03/11/2022 1155   PCO2ART 69 (HH) 03/11/2022 1155   PO2ART 156 (H) 03/11/2022 1155   HCO3 31.0 (H) 03/11/2022 1155   TCO2 30 08/05/2023 0654   O2SAT 100 03/11/2022 1155     Coagulation Profile: No results for input(s): "INR", "PROTIME" in the last 168 hours.  Cardiac Enzymes: No results for input(s): "CKTOTAL", "CKMB", "CKMBINDEX", "TROPONINI" in the last 168 hours.  HbA1C: HB A1C (BAYER DCA - WAIVED)  Date/Time Value Ref Range Status  01/12/2023 02:13 PM 5.9 (H) 4.8 - 5.6 % Final    Comment:             Prediabetes: 5.7 - 6.4          Diabetes: >6.4          Glycemic control for adults with diabetes: <7.0   10/12/2022 02:20 PM 5.9 (H) 4.8 - 5.6 % Final    Comment:             Prediabetes: 5.7 - 6.4          Diabetes: >6.4          Glycemic control for adults with diabetes: <7.0    Hgb A1c MFr Bld  Date/Time Value Ref Range Status  02/16/2023 03:54 PM 6.4 (H) 4.8 - 5.6 % Final    Comment:    (NOTE) Pre diabetes:          5.7%-6.4%  Diabetes:              >6.4%  Glycemic control for   <7.0% adults with diabetes     CBG: Recent Labs  Lab 08/05/23 1222  GLUCAP 86    Review of Systems:   Unable to assess due to intubation/sedation/critical illness   Past Medical History:  He,  has a past medical history of Anemia in chronic kidney disease, Cellulitis of left lower extremity, Chronic atrial fibrillation (HCC), Chronic kidney disease, Congestive heart failure (HCC), Difficult intubation, End-stage renal disease on hemodialysis (HCC), Hyperlipidemia, Hypertension, and Type 2 diabetes mellitus with complication (HCC).   Surgical History:   Past Surgical History:  Procedure Laterality Date   A/V FISTULAGRAM Left 08/27/2022   Procedure: A/V Fistulagram;  Surgeon: Celso College, MD;  Location: [facility] INVASIVE CV  LAB;  Service: Cardiovascular;  Laterality: Left;   AMPUTATION Right 03/13/2022   Procedure: AMPUTATION ABOVE KNEE;  Surgeon: Celso College, MD;  Location: [facility] ORS;  Service: General;  Laterality: Right;   APPLICATION OF WOUND VAC Right 03/07/2022   Procedure: APPLICATION OF WOUND VAC;  Surgeon: Lesta Rater, MD;  Location: [facility] ORS;  Service: Vascular;  Laterality: Right;  GAAC [address]   LOWER EXTREMITY ANGIOGRAPHY Left 02/15/2023  Procedure: Lower Extremity Angiography;  Surgeon: Celso College, MD;  Location: [facility] INVASIVE CV LAB;  Service: Cardiovascular;  Laterality: Left;   WOUND DEBRIDEMENT Left    WOUND DEBRIDEMENT Right 01/16/2022   Procedure: DEBRIDEMENT WOUND;  Surgeon: Jackquelyn Mass, MD;  Location: [facility] ORS;  Service: Vascular;  Laterality: Right;  Wound vac placement   WOUND DEBRIDEMENT Right 03/07/2022   Procedure: DEBRIDEMENT WOUND RIGHT LOWER EXTREMITY;  Surgeon: Lesta Rater, MD;  Location: [facility] ORS;  Service: Vascular;  Laterality: Right;     Social History:   reports that he has never smoked. He has never used smokeless tobacco. He reports that he does not currently use alcohol. He reports that he does not use drugs.   Family History:  His family history includes Cirrhosis in his father; Heart failure in his mother; Hypertension in his daughter.   Allergies Allergies  Allergen Reactions   Tramadol  Rash     Home Medications  Prior to Admission medications   Medication Sig Start Date End Date Taking? Authorizing Provider  amiodarone  (PACERONE ) 200 MG tablet Take 1 tablet (200 mg total) by mouth 2 (two) times daily. 10/12/22  Yes Cannady, Jolene T, NP  aspirin  EC 81 MG tablet Take 1 tablet (81 mg total) by mouth daily. Swallow whole. 03/05/23  Yes Althia Atlas, MD  carvedilol  (COREG ) 6.25 MG tablet Take 6.25 mg by mouth daily. 06/25/23  Yes [provider]  ELIQUIS  2.5 MG TABS tablet Take 1 tablet (2.5 mg total) by mouth 2 (two) times daily. 10/12/22  Yes  Cannady, Jolene T, NP  iron  polysaccharides (NIFEREX) 150 MG capsule Take 1 capsule (150 mg total) by mouth daily. 03/05/23  Yes Althia Atlas, MD  melatonin 3 MG TABS tablet Take 3 mg by mouth at bedtime.   Yes [provider]  midodrine (PROAMATINE) 5 MG tablet Take 5 mg by mouth as needed (post dialysis hypotension SBP<90 or DBP<55).   Yes [provider]  rosuvastatin  (CRESTOR ) 40 MG tablet Take 1 tablet (40 mg total) by mouth daily. 10/12/22  Yes Cannady, Jolene T, NP  SANTYL  250 UNIT/GM ointment Apply 1 Application topically daily. 02/08/23  Yes [provider]  senna (SENOKOT) 8.6 MG TABS tablet Take 2 tablets (17.2 mg total) by mouth at bedtime. 04/03/22  Yes Love, Renay Carota, PA-C  sevelamer  carbonate (RENVELA ) 800 MG tablet Take 2,400 mg by mouth 3 (three) times daily. 09/21/22  Yes [provider]  traZODone  (DESYREL ) 100 MG tablet Take 1 tablet (100 mg total) by mouth at bedtime. 01/12/23  Yes Cannady, Jolene T, NP  acetaminophen  (TYLENOL ) 325 MG tablet Take 2 tablets (650 mg total) by mouth every 6 (six) hours as needed for mild pain (pain score 1-3), fever or headache (or Fever >/= 101). 03/04/23   Althia Atlas, MD  oxyCODONE -acetaminophen  (PERCOCET) 10-325 MG tablet Take 1 tablet by mouth every 6 (six) hours as needed for pain. 08/05/23   Celso College, MD     Critical care time: 60 minutes     Cherylann Corpus, AGACNP-BC [facility] Prefer epic messenger for cross cover needs If after hours, please call E-link

## 2023-08-05 NOTE — Anesthesia Procedure Notes (Addendum)
 Procedure Name: General with mask airway Date/Time: 08/05/2023 7:49 AM  Performed by: Niki Barter, CRNAPre-anesthesia Checklist: Patient identified, Emergency Drugs available, Suction available and Patient being monitored Patient Re-evaluated:Patient Re-evaluated prior to induction Oxygen Delivery Method: Simple face mask Induction Type: IV induction Ventilation: Oral airway inserted - appropriate to patient size Placement Confirmation: positive ETCO2 and breath sounds checked- equal and bilateral Dental Injury: Teeth and Oropharynx as per pre-operative assessment

## 2023-08-05 NOTE — Progress Notes (Signed)
 Per Belle Box RN is okay to use OG tube for medication administration.

## 2023-08-05 NOTE — H&P (Signed)
 [facility] Admission History & Physical  MRN : [id number]  Adam Howell is a 63 y.o. (Jun 06, 1960) male who presents with chief complaint of No chief complaint on file. [name]  History of Present Illness: Patient presents today for revision of her AVF.  This is very aneurysmal with scabs and skin threat.  No fever or chills.   Current Facility-Administered Medications  Medication Dose Route Frequency Provider Last Rate Last Admin   0.9 %  sodium chloride  infusion   Intravenous Continuous Zula Hitch, MD       ceFAZolin  (ANCEF ) IVPB 2g/100 mL premix  2 g Intravenous On Call to OR Schriever, Fallon E, NP       chlorhexidine  (PERIDEX ) 0.12 % solution 15 mL  15 mL Mouth/Throat Once Zula Hitch, MD       Or   Oral care mouth rinse  15 mL Mouth Rinse Once Zula Hitch, MD       Chlorhexidine  Gluconate Cloth 2 % PADS 6 each  6 each Topical Once Crusoe, Fallon E, NP       And   Chlorhexidine  Gluconate Cloth 2 % PADS 6 each  6 each Topical Once Wisser, Fallon E, NP       HYDROmorphone  (DILAUDID ) injection 1 mg  1 mg Intravenous Once PRN Dible, Fallon E, NP       ondansetron  (ZOFRAN ) injection 4 mg  4 mg Intravenous Q6H PRN Valene Gash, NP        Past Medical History:  Diagnosis Date   Anemia in chronic kidney disease    Cellulitis of left lower extremity    Chronic atrial fibrillation (HCC)    Chronic kidney disease    Congestive heart failure (HCC)    Difficult intubation    End-stage renal disease on hemodialysis (HCC)    Hyperlipidemia    Hypertension    Type 2 diabetes mellitus with complication [facility])     Past Surgical History:  Procedure Laterality Date   A/V FISTULAGRAM Left 08/27/2022   Procedure: A/V Fistulagram;  Surgeon: Celso College, MD;  Location: [facility] INVASIVE CV LAB;  Service: Cardiovascular;  Laterality: Left;   AMPUTATION Right 03/13/2022   Procedure: AMPUTATION ABOVE KNEE;  Surgeon: Celso College, MD;  Location: [facility] ORS;  Service:  General;  Laterality: Right;   APPLICATION OF WOUND VAC Right 03/07/2022   Procedure: APPLICATION OF WOUND VAC;  Surgeon: Lesta Rater, MD;  Location: [facility] ORS;  Service: Vascular;  Laterality: Right;  GAAC [id number]   LOWER EXTREMITY ANGIOGRAPHY Left 02/15/2023   Procedure: Lower Extremity Angiography;  Surgeon: Celso College, MD;  Location: [facility] INVASIVE CV LAB;  Service: Cardiovascular;  Laterality: Left;   WOUND DEBRIDEMENT Left    WOUND DEBRIDEMENT Right 01/16/2022   Procedure: DEBRIDEMENT WOUND;  Surgeon: Jackquelyn Mass, MD;  Location: [facility] ORS;  Service: Vascular;  Laterality: Right;  Wound vac placement   WOUND DEBRIDEMENT Right 03/07/2022   Procedure: DEBRIDEMENT WOUND RIGHT LOWER EXTREMITY;  Surgeon: Lesta Rater, MD;  Location: [facility] ORS;  Service: Vascular;  Laterality: Right;     Social History   Tobacco Use   Smoking status: Never   Smokeless tobacco: Never  Vaping Use   Vaping status: Never Used  Substance Use Topics   Alcohol use: Not Currently   Drug use: Never     Family History  Problem Relation Age of Onset   Heart failure Mother    Cirrhosis Father  Hypertension Daughter     Allergies  Allergen Reactions   Tramadol  Rash     REVIEW OF SYSTEMS (Negative unless checked)   Constitutional: [] Weight loss  [] Fever  [] Chills Cardiac: [] Chest pain   [] Chest pressure   [] Palpitations   [] Shortness of breath when laying flat   [] Shortness of breath at rest   [] Shortness of breath with exertion. Vascular:  [] Pain in legs with walking   [] Pain in legs at rest   [] Pain in legs when laying flat   [] Claudication   [] Pain in feet when walking  [] Pain in feet at rest  [] Pain in feet when laying flat   [] History of DVT   [] Phlebitis   [] Swelling in legs   [] Varicose veins   [] Non-healing ulcers Pulmonary:   [] Uses home oxygen   [] Productive cough   [] Hemoptysis   [] Wheeze  [] COPD   [] Asthma Neurologic:  [] Dizziness  [] Blackouts   [] Seizures   [] History of stroke    [] History of TIA  [] Aphasia   [] Temporary blindness   [] Dysphagia   [] Weakness or numbness in arms   [] Weakness or numbness in legs Musculoskeletal:  [] Arthritis   [] Joint swelling   [x] Joint pain   [] Low back pain Hematologic:  [] Easy bruising  [] Easy bleeding   [] Hypercoagulable state   [x] Anemic   Gastrointestinal:  [] Blood in stool   [] Vomiting blood  [] Gastroesophageal reflux/heartburn   [] Abdominal pain Genitourinary:  [x] Chronic kidney disease   [] Difficult urination  [] Frequent urination  [] Burning with urination   [] Hematuria Skin:  [] Rashes   [x] Ulcers   [x] Wounds Psychological:  [] History of anxiety   []  History of major depression.    Physical Examination  Vitals:   08/05/23 0627  BP: 126/67  Pulse: (!) 57  Resp: 14  Temp: 98.6 F (37 C)  TempSrc: Temporal  SpO2: 100%  Weight: 102.1 kg  Height: 6' (1.829 m)   Body mass index is 30.52 kg/m. Gen: WD/WN, NAD Head: [place]/AT, No temporalis wasting.  Ear/Nose/Throat: Hearing grossly intact, nares w/o erythema or drainage, oropharynx w/o Erythema/Exudate,  Eyes: Conjunctiva clear, sclera non-icteric Neck: Trachea midline.  No JVD.  Pulmonary:  Good air movement, respirations not labored, no use of accessory muscles.  Cardiac: RRR, normal S1, S2. Vascular: aneurysmal AVF with scabs present. Vessel Right Left  Radial Palpable Palpable                   Musculoskeletal: M/S 5/5 throughout.  Extremities without ischemic changes.  No deformity or atrophy.  Neurologic: Sensation grossly intact in extremities.  Symmetrical.  Speech is fluent. Motor exam as listed above. Psychiatric: Judgment intact, Mood & affect appropriate for pt's clinical situation. Dermatologic: No rashes or ulcers noted.  No cellulitis or open wounds. Lymph : No Cervical, Axillary, or Inguinal lymphadenopathy.     CBC Lab Results  Component Value Date   WBC 3.8 (L) 08/05/2023   HGB 14.5 08/05/2023   HGB 16.3 08/05/2023   HCT 45.4 08/05/2023    HCT 48.0 08/05/2023   MCV 88.2 08/05/2023   PLT 108 (L) 08/05/2023    BMET    Component Value Date/Time   NA 136 08/05/2023 0654   NA 136 04/14/2023 1610   K 4.2 08/05/2023 0654   CL 99 08/05/2023 0654   CO2 27 04/14/2023 1610   GLUCOSE 102 (H) 08/05/2023 0654   BUN 31 (H) 08/05/2023 0654   BUN 18 04/14/2023 1610   CREATININE 8.40 (H) 08/05/2023 0654   CALCIUM  8.3 (L)  04/14/2023 1610   GFRNONAA 5 (L) 03/03/2023 1033   Estimated Creatinine Clearance: 11.3 mL/min (A) (by C-G formula based on SCr of 8.4 mg/dL (H)).  COAG Lab Results  Component Value Date   INR 1.2 03/05/2022    Radiology No results found.   Assessment/Plan Hypertension blood pressure control important in reducing the progression of atherosclerotic disease. On appropriate oral medications.     Type 2 diabetes mellitus with ESRD (end-stage renal disease) (HCC) blood glucose control important in reducing the progression of atherosclerotic disease. Also, involved in wound healing. On appropriate medications.     Hyperlipidemia associated with type 2 diabetes mellitus (HCC) lipid control important in reducing the progression of atherosclerotic disease. Continue statin therapy     S/P AKA (above knee amputation), right (HCC) Well healed.   ESRD on dialysis [facility]) Duplex shows a stenosis in the mid forearm outflow vein.  The fistula is quite aneurysmal with scabs and impending skin threat putting it at high risk for bleeding.  This needs a surgical revision.  I would plan on doing a jump graft of this access.  He will need a PermCath for few weeks while this heals.   Mikki Alexander, MD  08/05/2023 7:22 AM

## 2023-08-05 NOTE — Transfer of Care (Signed)
 Immediate Anesthesia Transfer of Care Note  Patient: Adam Howell  Procedure(s) Performed: REVISON OF ARTERIOVENOUS FISTULA (Left: Arm Lower) DIALYSIS/PERMA CATHETER INSERTION (Right: Neck) right groin venogram, angioplasty of superior venacava and anomic vein (Right: Groin) CENTRAL LINE INSERTION (Left: Neck)  Patient Location: ICU  Anesthesia Type:General  Level of Consciousness: Patient remains intubated per anesthesia plan  Airway & Oxygen Therapy: Patient remains intubated per anesthesia plan  Post-op Assessment: Report given to RN and Post -op Vital signs reviewed and stable  Post vital signs: Reviewed and stable  Last Vitals:  Vitals Value Taken Time  BP    Temp    Pulse    Resp    SpO2      Last Pain:  Vitals:   08/05/23 0740  TempSrc:   PainSc: 0-No pain      Patients Stated Pain Goal: 0 (08/05/23 0627)  Complications: No notable events documented.

## 2023-08-05 NOTE — Anesthesia Procedure Notes (Signed)
 Procedure Name: Intubation Date/Time: 08/05/2023 10:30 AM  Performed by: Niki Barter, CRNAPre-anesthesia Checklist: Patient identified, Emergency Drugs available, Suction available and Patient being monitored Patient Re-evaluated:Patient Re-evaluated prior to induction Oxygen Delivery Method: Circle system utilized Preoxygenation: Pre-oxygenation with 100% oxygen Induction Type: IV induction Ventilation: Mask ventilation without difficulty Laryngoscope Size: McGrath and 3 Grade View: Grade II Tube type: Oral Tube size: 7.0 mm Number of attempts: 1 Airway Equipment and Method: Stylet and Oral airway Placement Confirmation: ETT inserted through vocal cords under direct vision, positive ETCO2 and breath sounds checked- equal and bilateral Secured at: 21 cm Tube secured with: Tape Dental Injury: Teeth and Oropharynx as per pre-operative assessment

## 2023-08-06 ENCOUNTER — Inpatient Hospital Stay

## 2023-08-06 ENCOUNTER — Encounter: Payer: Self-pay | Admitting: Vascular Surgery

## 2023-08-06 DIAGNOSIS — N186 End stage renal disease: Secondary | ICD-10-CM

## 2023-08-06 DIAGNOSIS — I5020 Unspecified systolic (congestive) heart failure: Secondary | ICD-10-CM

## 2023-08-06 DIAGNOSIS — Z01812 Encounter for preprocedural laboratory examination: Secondary | ICD-10-CM

## 2023-08-06 DIAGNOSIS — I10 Essential (primary) hypertension: Secondary | ICD-10-CM

## 2023-08-06 LAB — LACTIC ACID, PLASMA
Lactic Acid, Venous: 3.3 mmol/L (ref 0.5–1.9)
Lactic Acid, Venous: 5.2 mmol/L (ref 0.5–1.9)
Lactic Acid, Venous: 2.9 mmol/L (ref 0.5–1.9)

## 2023-08-06 LAB — HEMOGLOBIN AND HEMATOCRIT, BLOOD
HCT: 26.7 % — ABNORMAL LOW (ref 39.0–52.0)
Hemoglobin: 8.8 g/dL — ABNORMAL LOW (ref 13.0–17.0)

## 2023-08-06 LAB — CBC
HCT: 31.8 % — ABNORMAL LOW (ref 39.0–52.0)
HCT: 24.7 % — ABNORMAL LOW (ref 39.0–52.0)
Hemoglobin: 10.6 g/dL — ABNORMAL LOW (ref 13.0–17.0)
Hemoglobin: 8.4 g/dL — ABNORMAL LOW (ref 13.0–17.0)
MCH: 28.6 pg (ref 26.0–34.0)
MCH: 28.7 pg (ref 26.0–34.0)
MCHC: 33.3 g/dL (ref 30.0–36.0)
MCHC: 34 g/dL (ref 30.0–36.0)
MCV: 85.9 fL (ref 80.0–100.0)
MCV: 84.3 fL (ref 80.0–100.0)
Platelets: 152 10*3/uL (ref 150–400)
Platelets: 141 10*3/uL — ABNORMAL LOW (ref 150–400)
RBC: 3.7 MIL/uL — ABNORMAL LOW (ref 4.22–5.81)
RBC: 2.93 MIL/uL — ABNORMAL LOW (ref 4.22–5.81)
RDW: 15.4 % (ref 11.5–15.5)
RDW: 15.7 % — ABNORMAL HIGH (ref 11.5–15.5)
WBC: 14.5 10*3/uL — ABNORMAL HIGH (ref 4.0–10.5)
WBC: 14 10*3/uL — ABNORMAL HIGH (ref 4.0–10.5)
nRBC: 0.1 % (ref 0.0–0.2)
nRBC: 0 % (ref 0.0–0.2)

## 2023-08-06 LAB — RENAL FUNCTION PANEL
Albumin: 3.4 g/dL — ABNORMAL LOW (ref 3.5–5.0)
Anion gap: 12 (ref 5–15)
BUN: 29 mg/dL — ABNORMAL HIGH (ref 8–23)
CO2: 25 mmol/L (ref 22–32)
Calcium: 8.8 mg/dL — ABNORMAL LOW (ref 8.9–10.3)
Chloride: 95 mmol/L — ABNORMAL LOW (ref 98–111)
Creatinine, Ser: 6.85 mg/dL — ABNORMAL HIGH (ref 0.61–1.24)
GFR, Estimated: 8 mL/min — ABNORMAL LOW (ref >60)
Glucose, Bld: 174 mg/dL — ABNORMAL HIGH (ref 70–99)
Phosphorus: 3 mg/dL (ref 2.5–4.6)
Potassium: 5.5 mmol/L — ABNORMAL HIGH (ref 3.5–5.1)
Sodium: 132 mmol/L — ABNORMAL LOW (ref 135–145)

## 2023-08-06 LAB — HEMOGLOBIN A1C
Hgb A1c MFr Bld: 6 % — ABNORMAL HIGH (ref 4.8–5.6)
Mean Plasma Glucose: 125.5 mg/dL

## 2023-08-06 LAB — GLUCOSE, CAPILLARY
Glucose-Capillary: 143 mg/dL — ABNORMAL HIGH (ref 70–99)
Glucose-Capillary: 143 mg/dL — ABNORMAL HIGH (ref 70–99)
Glucose-Capillary: 158 mg/dL — ABNORMAL HIGH (ref 70–99)
Glucose-Capillary: 165 mg/dL — ABNORMAL HIGH (ref 70–99)
Glucose-Capillary: 169 mg/dL — ABNORMAL HIGH (ref 70–99)
Glucose-Capillary: 172 mg/dL — ABNORMAL HIGH (ref 70–99)
Glucose-Capillary: 175 mg/dL — ABNORMAL HIGH (ref 70–99)

## 2023-08-06 LAB — TRIGLYCERIDES: Triglycerides: 77 mg/dL (ref <150)

## 2023-08-06 LAB — HIV ANTIBODY (ROUTINE TESTING W REFLEX): HIV Screen 4th Generation wRfx: NONREACTIVE

## 2023-08-06 MED ORDER — SODIUM CHLORIDE 0.9 % IV BOLUS
250.0000 mL | Freq: Once | INTRAVENOUS | Status: AC
  Administered 2023-08-06: 250 mL via INTRAVENOUS

## 2023-08-06 MED ORDER — MIDAZOLAM HCL 2 MG/2ML IJ SOLN: 2.0000 mg | Freq: Once | INTRAMUSCULAR | Status: AC

## 2023-08-06 MED ORDER — PIPERACILLIN-TAZOBACTAM IN DEX 2-0.25 GM/50ML IV SOLN
2.2500 g | Freq: Three times a day (TID) | INTRAVENOUS | Status: AC
Start: 2023-08-06 — End: 2023-08-10
  Administered 2023-08-06 – 2023-08-10 (×15): 2.25 g via INTRAVENOUS
  Filled 2023-08-06 (×18): qty 50

## 2023-08-06 MED ORDER — LACTATED RINGERS IV BOLUS
500.0000 mL | Freq: Once | INTRAVENOUS | Status: AC
  Administered 2023-08-06: 500 mL via INTRAVENOUS

## 2023-08-06 MED ORDER — ORAL CARE MOUTH RINSE
15.0000 mL | OROMUCOSAL | Status: DC
  Administered 2023-08-06 – 2023-08-08 (×27): 15 mL via OROMUCOSAL

## 2023-08-06 MED ORDER — MIDAZOLAM HCL 2 MG/2ML IJ SOLN
INTRAMUSCULAR | Status: AC
  Administered 2023-08-06: 2 mg via INTRAVENOUS
  Filled 2023-08-06: qty 2

## 2023-08-06 MED ORDER — ORAL CARE MOUTH RINSE: 15.0000 mL | OROMUCOSAL | Status: DC | PRN

## 2023-08-06 MED ORDER — SODIUM CHLORIDE 0.9% IV SOLUTION: Freq: Once | INTRAVENOUS | Status: AC

## 2023-08-06 MED ORDER — LIDOCAINE HCL 1 % IJ SOLN
INTRAMUSCULAR | Status: AC
  Filled 2023-08-06: qty 10

## 2023-08-06 MED ORDER — LIDOCAINE HCL 1 % IJ SOLN
10.0000 mL | Freq: Once | INTRAMUSCULAR | Status: AC
  Administered 2023-08-06: 10 mL via INTRADERMAL

## 2023-08-06 MED ORDER — SODIUM ZIRCONIUM CYCLOSILICATE 5 G PO PACK
10.0000 g | PACK | Freq: Once | ORAL | Status: AC
  Administered 2023-08-06: 10 g via ORAL
  Filled 2023-08-06: qty 2

## 2023-08-06 NOTE — Plan of Care (Signed)
  Problem: Safety: Goal: Ability to remain free from injury will improve Outcome: Progressing   Problem: Tissue Perfusion: Goal: Adequacy of tissue perfusion will improve Outcome: Progressing

## 2023-08-06 NOTE — Anesthesia Postprocedure Evaluation (Signed)
 Anesthesia Post Note  Patient: Adam Howell  Procedure(s) Performed: REVISON OF ARTERIOVENOUS FISTULA (Left: Arm Lower) DIALYSIS/PERMA CATHETER INSERTION (Right: Neck) right groin venogram, angioplasty of superior venacava and anomic vein (Right: Groin) CENTRAL LINE INSERTION (Left: Neck)  Patient location during evaluation: SICU Anesthesia Type: General Level of consciousness: awake Pain management: pain level controlled Vital Signs Assessment: post-procedure vital signs reviewed and stable Respiratory status: patient remains intubated per anesthesia plan Cardiovascular status: stable Postop Assessment: no apparent nausea or vomiting Anesthetic complications: no   No notable events documented.   Last Vitals:  Vitals:   08/06/23 0700 08/06/23 0717  BP: 98/63 (!) 89/69  Pulse: 85 87  Resp: 18 20  Temp:  37.1 C  SpO2: 96% 98%    Last Pain:  Vitals:   08/06/23 0717  TempSrc: Axillary  PainSc:                  Portia Brittle Yevonne Yokum

## 2023-08-06 NOTE — Progress Notes (Signed)
 Progress Note    08/06/2023 7:58 AM 1 Day Post-Op  Subjective: Adam Howell is a 63 year old male now postop day #1 from left radiocephalic arteriovenous fistula jump graft.  He also underwent ligation of left radiocephalic AV fistula.  Patient also underwent fluoroscopic guidance for placement of a permacatheter.  He had a right jugular venogram and superior venacavogram.  Placement was attempted into the right internal jugular vein.  The vessel was small and therefore needed transluminal angioplasty of the right innominate vein and superior vena cava.  It was unable to be placed so was removed from the right jugular vein.  The patient then had placement of a right femoral PermCath and a left femoral triple-lumen catheter for venous access.  Patient remains sedated and intubated this morning.  He also remains on epinephrine  and Levophed for blood pressure support.  Vitals remained stable while on blood pressure support and ventilation.   Vitals:   08/06/23 0717 08/06/23 0738  BP: (!) 89/69 94/68  Pulse: 87 84  Resp: 20 20  Temp: 98.7 F (37.1 C)   SpO2: 98% 100%   Physical Exam: Cardiac:  RRR, normal S1-S2, no rubs clicks gallops or murmurs. Lungs: Lungs are rhonchorous on auscultation throughout.  Unable to hear breath sounds in the right lower and right middle lobe.  Left lower lobe able to auscultate breath sounds and moving air.  Patient remains ventilated Incisions: Bilateral groin incisions for dialysis catheter placement triple-lumen placement.  In right jugular vein incision for permacath placement that was removed. Extremities: Unable to palpate pulses throughout due to blood pressure support medication.  All extremities cool to touch this morning. Abdomen: Bowel sounds throughout on auscultation, soft, nontender and nondistended. Neurologic: Patient is sedated and ventilated at this time.  CBC    Component Value Date/Time   WBC 14.5 (H) 08/06/2023 0557   RBC 3.70 (L)  08/06/2023 0557   HGB 10.6 (L) 08/06/2023 0557   HGB 16.8 01/12/2023 1414   HCT 31.8 (L) 08/06/2023 0557   HCT 51.0 01/12/2023 1414   PLT 152 08/06/2023 0557   PLT 257 01/12/2023 1414   MCV 85.9 08/06/2023 0557   MCV 88 01/12/2023 1414   MCH 28.6 08/06/2023 0557   MCHC 33.3 08/06/2023 0557   RDW 15.4 08/06/2023 0557   RDW 14.8 01/12/2023 1414   LYMPHSABS 1.6 08/05/2023 1305   LYMPHSABS 1.0 01/12/2023 1414   MONOABS 0.4 08/05/2023 1305   EOSABS 0.1 08/05/2023 1305   EOSABS 0.2 01/12/2023 1414   BASOSABS 0.1 08/05/2023 1305   BASOSABS 0.1 01/12/2023 1414    BMET    Component Value Date/Time   NA 133 (L) 08/05/2023 1305   NA 136 04/14/2023 1610   K 5.5 (H) 08/05/2023 1305   CL 95 (L) 08/05/2023 1305   CO2 26 08/05/2023 1305   GLUCOSE 243 (H) 08/05/2023 1305   BUN 34 (H) 08/05/2023 1305   BUN 18 04/14/2023 1610   CREATININE 8.72 (H) 08/05/2023 1305   CALCIUM  8.1 (L) 08/05/2023 1305   GFRNONAA 6 (L) 08/05/2023 1305    INR    Component Value Date/Time   INR 1.3 (H) 08/05/2023 1305     Intake/Output Summary (Last 24 hours) at 08/06/2023 0758 Last data filed at 08/06/2023 0701 Gross per 24 hour  Intake 1443.99 ml  Output 0 ml  Net 1443.99 ml     Assessment/Plan:  64 y.o. male is s/p day #1 from left radiocephalic arteriovenous fistula jump graft.  He also underwent  ligation of left radiocephalic AV fistula.  Patient also underwent fluoroscopic guidance for placement of a permacatheter.  He had a right jugular venogram and superior venacavogram.  Placement was attempted into the right internal jugular vein.  The vessel was small and therefore needed transluminal angioplasty of the right innominate vein and superior vena cava.  It was unable to be placed so was removed from the right jugular vein.  The patient then had placement of a right femoral PermCath and a left femoral triple-lumen catheter for venous access.  1 Day Post-Op    PLAN Patient is under the care of the  ICU team at this time.  Plan is to place chest tube to the right chest due to bleeding into the lung cavity.  Patient remains on blood pressure support and ventilatory support until stabilized.  DVT prophylaxis: None at this time   Annamaria Barrette [facility] 08/06/2023 7:58 AM

## 2023-08-06 NOTE — Plan of Care (Signed)
  Problem: Activity: Goal: Ability to tolerate increased activity will improve Outcome: Progressing   Problem: Respiratory: Goal: Ability to maintain a clear airway and adequate ventilation will improve Outcome: Progressing   Problem: Role Relationship: Goal: Method of communication will improve Outcome: Progressing   Problem: Education: Goal: Knowledge of General Education information will improve Description: Including pain rating scale, medication(s)/side effects and non-pharmacologic comfort measures Outcome: Progressing   Problem: Health Behavior/Discharge Planning: Goal: Ability to manage health-related needs will improve Outcome: Progressing   Problem: Clinical Measurements: Goal: Ability to maintain clinical measurements within normal limits will improve Outcome: Progressing Goal: Will remain free from infection Outcome: Progressing Goal: Diagnostic test results will improve Outcome: Progressing Goal: Respiratory complications will improve Outcome: Progressing Goal: Cardiovascular complication will be avoided Outcome: Progressing   Problem: Activity: Goal: Risk for activity intolerance will decrease Outcome: Progressing   Problem: Nutrition: Goal: Adequate nutrition will be maintained Outcome: Progressing   Problem: Coping: Goal: Level of anxiety will decrease Outcome: Progressing   Problem: Elimination: Goal: Will not experience complications related to bowel motility Outcome: Progressing Goal: Will not experience complications related to urinary retention Outcome: Progressing   Problem: Pain Managment: Goal: General experience of comfort will improve and/or be controlled Outcome: Progressing   Problem: Safety: Goal: Ability to remain free from injury will improve Outcome: Progressing   Problem: Skin Integrity: Goal: Risk for impaired skin integrity will decrease Outcome: Progressing   Problem: Education: Goal: Ability to describe self-care  measures that may prevent or decrease complications (Diabetes Survival Skills Education) will improve Outcome: Progressing Goal: Individualized Educational Video(s) Outcome: Progressing   Problem: Coping: Goal: Ability to adjust to condition or change in health will improve Outcome: Progressing   Problem: Fluid Volume: Goal: Ability to maintain a balanced intake and output will improve Outcome: Progressing   Problem: Health Behavior/Discharge Planning: Goal: Ability to identify and utilize available resources and services will improve Outcome: Progressing Goal: Ability to manage health-related needs will improve Outcome: Progressing   Problem: Metabolic: Goal: Ability to maintain appropriate glucose levels will improve Outcome: Progressing   Problem: Nutritional: Goal: Maintenance of adequate nutrition will improve Outcome: Progressing Goal: Progress toward achieving an optimal weight will improve Outcome: Progressing   Problem: Skin Integrity: Goal: Risk for impaired skin integrity will decrease Outcome: Progressing   Problem: Tissue Perfusion: Goal: Adequacy of tissue perfusion will improve Outcome: Progressing

## 2023-08-06 NOTE — Progress Notes (Signed)
 [facility]  ROUNDING NOTE   Subjective:   Adam Howell is a 63 y.o male with past medical history of diabetes, hypertension, CAD, Rt AKA, anemia, CHF, and ESRD on dialysis. Patient presents to vascular surgery for revision of AVF. He is being admitted for End stage renal disease (HCC) [N18.6] ESRD (end stage renal disease) (HCC) [N18.6]  Patient known to our practice and receives outpatient dialysis treatments at [facility] on a MWF schedule, supervised by [facility] physicians. It was reported that patient expereiced bleeding during permcath insertion, then suffered an anaphylactic reaction to contrast.   Patient remains intubated on vent Required pressors during dialysis and some overnight.  No pressors at present. Remains on sedation Epinephrine  drip in place   Objective:  Vital signs in last 24 hours:  Temp:  [97 F (36.1 C)-100.2 F (37.9 C)] 98.2 F (36.8 C) (05/23 1145) Pulse Rate:  [76-91] 84 (05/23 0738) Resp:  [14-26] 20 (05/23 0738) BP: (60-209)/(32-159) 100/67 (05/23 1145) SpO2:  [96 %-100 %] 98 % (05/23 1110) FiO2 (%):  [35 %-60 %] 35 % (05/23 1145) Weight:  [102.1 kg] 102.1 kg (05/22 2310)  Weight change: 0.041 kg Filed Weights   08/05/23 0627 08/05/23 1930 08/05/23 2310  Weight: 102.1 kg 102.1 kg 102.1 kg    Intake/Output: I/O last 3 completed shifts: In: 1532.7 [I.V.:1322.7; NG/GT:100; IV Piggyback:110] Out: 0    Intake/Output this shift:  Total I/O In: 131.3 [I.V.:11.3; NG/GT:120] Out: 100 [Emesis/NG output:100]  Physical Exam: General: Ill-appearing  Head: Normocephalic, atraumatic.   Eyes: Anicteric  Lungs:  Intubated on ventilator  Heart: Regular rate and rhythm  Abdomen:  Soft, nontender, obese  Extremities: No peripheral edema.,  Right AKA  Neurologic: Sedated  Skin: No lesions  Access: Left aVF (no bruit or thrill) right HD femoral temp cath    Basic Metabolic Panel: Recent Labs  Lab 08/05/23 0654 08/05/23 1305  08/06/23 0549  NA 135  136 133* 132*  K 4.2  4.2 5.5* 5.5*  CL 93*  99 95* 95*  CO2 30 26 25   GLUCOSE 102*  102* 243* 174*  BUN 32*  31* 34* 29*  CREATININE 8.47*  8.40* 8.72* 6.85*  CALCIUM  9.1 8.1* 8.8*  PHOS  --   --  3.0    Liver Function Tests: Recent Labs  Lab 08/05/23 1305 08/06/23 0549  AST 17  --   ALT 17  --   ALKPHOS 84  --   BILITOT 0.8  --   PROT 6.5  --   ALBUMIN  3.2* 3.4*   No results for input(s): "LIPASE", "AMYLASE" in the last 168 hours. No results for input(s): "AMMONIA" in the last 168 hours.  CBC: Recent Labs  Lab 08/05/23 0654 08/05/23 1305 08/05/23 2143 08/06/23 0557  WBC 3.8* 15.0* 13.5* 14.5*  NEUTROABS 1.7 12.6*  --   --   HGB 14.5  16.3 11.9* 11.3* 10.6*  HCT 45.4  48.0 37.1* 35.4* 31.8*  MCV 88.2 88.5 87.6 85.9  PLT 108* 160 151 152    Cardiac Enzymes: No results for input(s): "CKTOTAL", "CKMB", "CKMBINDEX", "TROPONINI" in the last 168 hours.  BNP: Invalid input(s): "POCBNP"  CBG: Recent Labs  Lab 08/05/23 1929 08/06/23 0016 08/06/23 0413 08/06/23 0728 08/06/23 1056  GLUCAP 140* 143* 158* 143* 172*    Microbiology: Results for orders placed or performed in visit on 07/27/23  CULTURE, URINE COMPREHENSIVE     Status: Abnormal   Collection Time: 07/27/23  2:28 PM  Specimen: Urine   UR  Result Value Ref Range Status   Urine Culture, Comprehensive Final report (A)  Final   Organism ID, Bacteria Comment (A)  Final    Comment: Escherichia coli, identified by an automated biochemical system. Cefazolin  with an MIC <=16 predicts susceptibility to the oral agents cefaclor, cefdinir, cefpodoxime, cefprozil, cefuroxime, cephalexin , and loracarbef when used for therapy of uncomplicated urinary tract infections due to E. coli, Klebsiella pneumoniae, and Proteus mirabilis. Greater than 100,000 colony forming units per mL    Organism ID, Bacteria Comment  Final    Comment: Mixed urogenital flora 25,000-50,000 colony  forming units per mL    ANTIMICROBIAL SUSCEPTIBILITY Comment  Final    Comment:       ** S = Susceptible; I = Intermediate; R = Resistant **                    P = Positive; N = Negative             MICS are expressed in micrograms per mL    Antibiotic                 RSLT#1    RSLT#2    RSLT#3    RSLT#4 Amoxicillin /Clavulanic Acid    S Ampicillin                      S Cefazolin                       S Cefepime                        S Cefoxitin                      S Cefpodoxime                    S Ceftriaxone                     S Ciprofloxacin                  R Ertapenem                      S Gentamicin                     S Levofloxacin                   R Meropenem                       S Nitrofurantoin                 S Piperacillin /Tazobactam        S Tetracycline                   S Tobramycin                     S Trimethoprim /Sulfa              S     Coagulation Studies: Recent Labs    08/05/23 1305  LABPROT 16.8*  INR 1.3*    Urinalysis: No results for input(s): "COLORURINE", "LABSPEC", "PHURINE", "GLUCOSEU", "HGBUR", "BILIRUBINUR", "KETONESUR", "PROTEINUR", "UROBILINOGEN", "NITRITE", "LEUKOCYTESUR" in the last 72 hours.  Invalid input(s): "APPERANCEUR"    Imaging: DG Chest [facility]  1 View Result Date: 08/06/2023 CLINICAL DATA:  Follow-up pleural effusion EXAM: PORTABLE CHEST 1 VIEW COMPARISON:  Chest CT from the previous day. FINDINGS: Cardiac shadow is stable. Endotracheal tube and gastric catheter are noted in satisfactory position. Right-sided pleural effusion is again identified and stable similar to that seen on the prior exam. No pneumothorax is seen. Left lung remains clear. IMPRESSION: Large right pleural effusion. Tubes and lines as described. Electronically Signed   By: Violeta Grey M.D.   On: 08/06/2023 03:42   CT CHEST WO CONTRAST Result Date: 08/05/2023 CLINICAL DATA:  Status post PermCath placement complicated by bleeding EXAM: CT CHEST WITHOUT  CONTRAST TECHNIQUE: Multidetector CT imaging of the chest was performed following the standard protocol without IV contrast. RADIATION DOSE REDUCTION: This exam was performed according to the departmental dose-optimization program which includes automated exposure control, adjustment of the mA and/or kV according to patient size and/or use of iterative reconstruction technique. COMPARISON:  Same day chest radiograph FINDINGS: Cardiovascular: Multichamber cardiomegaly. Inferior approach central venous catheter tip terminates at the inferior cavoatrial junction. No significant pericardial fluid/thickening. Great vessels are normal in course and caliber. Linear hyperdensity within the right brachiocephalic vein (5:47), which may be postprocedural. Multiple linear densities extending from the posterior aspect of the lower right brachiocephalic vein, for example series 3, image 43, 49. Mediastinum/Nodes: Mediastinum is deviated into the left hemidiaphragm. Imaged thyroid  gland without nodules meeting criteria for imaging follow-up by size. Normal esophagus. No pathologically enlarged axillary, supraclavicular, mediastinal, or hilar lymph nodes. Lungs/Pleura: ETT terminates 4.3 cm above the carina. The central airways are patent. Complete right lower lobe atelectasis. Subsegmental atelectasis of the right upper and middle lobes. Irregular consolidation in the left lower lobe. No focal consolidation. Trace right pneumothorax. Large right pleural effusion demonstrates increased attenuation. Upper abdomen: Enteric tube terminates along the lateral wall of the gastric fundus. Partially imaged multifocal bilateral renal hypodensities, many of which are subcentimeter. Musculoskeletal: No acute or abnormal lytic or blastic osseous lesions. Multilevel degenerative changes of the partially imaged thoracic and lumbar spine. Subcutaneous soft tissue stranding in the right upper chest. IMPRESSION: 1. Large right pleural effusion  demonstrates increased attenuation, suspicious for hemothorax. Trace right pneumothorax. 2. Complete right lower lobe atelectasis. Subsegmental atelectasis of the right upper and middle lobes. 3. Irregular consolidation in the left lower lobe, likely atelectasis. Superimposed aspiration or pneumonia is not excluded. 4. Linear hyperdensity within the right brachiocephalic vein, which may be postprocedural. Multiple linear densities extending from the posterior aspect of the lower right brachiocephalic vein, which may reflect postprocedural changes. 5. Multichamber cardiomegaly. 6. Subcutaneous soft tissue stranding in the right upper chest, likely postprocedural. Aortic Atherosclerosis (ICD10-I70.0). Coronary artery calcifications. Assessment for potential risk factor modification, dietary therapy or pharmacologic therapy may be warranted, if clinically indicated. Electronically Signed   By: Limin  Xu M.D.   On: 08/05/2023 19:32   CT HEAD WO CONTRAST ( ) Result Date: 08/05/2023 CLINICAL DATA:  Mental status change, unknown cause. EXAM: CT HEAD WITHOUT CONTRAST TECHNIQUE: Contiguous axial images were obtained from the base of the skull through the vertex without intravenous contrast. RADIATION DOSE REDUCTION: This exam was performed according to the departmental dose-optimization program which includes automated exposure control, adjustment of the mA and/or kV according to patient size and/or use of iterative reconstruction technique. COMPARISON:  Head CT and MRI 03/10/2022 FINDINGS: Brain: There is no evidence of an acute infarct, intracranial hemorrhage, mass, midline shift, or extra-axial fluid collection. A chronic infarct or other remote  insult is again noted in the right cerebellar hemisphere. Mild calcification is noted in the cerebellar vermis and left cerebellar hemisphere. There are chronic lacunar infarcts in the left basal ganglia, and there are unchanged small chronic left frontal, parietal, and  occipital cortical infarcts. No age advanced generalized cerebral atrophy is evident. The ventricles are normal in size. There is a background of mild chronic small vessel ischemia in the cerebral white matter. Vascular: Calcified atherosclerosis at the skull base. No hyperdense vessel. Skull: No acute fracture or suspicious lesion. Sinuses/Orbits: Visualized paranasal sinuses and mastoid air cells are clear. Unremarkable orbits. Other: None. IMPRESSION: 1. No evidence of acute intracranial abnormality. 2. Chronic ischemia as above. Electronically Signed   By: Aundra Lee M.D.   On: 08/05/2023 18:25   DG Abd 1 View Result Date: 08/05/2023 CLINICAL DATA:  Orogastric tube placement. EXAM: ABDOMEN - 1 VIEW COMPARISON:  Portable chest radiograph obtained earlier today. FINDINGS: Interval orogastric tube with its tip in the proximal to mid stomach. The endotracheal tube remains in satisfactory position. Stable diffuse right lung opacity and clear left lung. The included portion of the bowel is unremarkable. Extensive thoracic spine degenerative changes with changes of DISH. IMPRESSION: 1. Satisfactory position of the orogastric tube. 2. Stable large right pleural effusion. Electronically Signed   By: Catherin Closs M.D.   On: 08/05/2023 16:35   DG Chest Port 1 View Result Date: 08/05/2023 CLINICAL DATA:  Status post intubation EXAM: PORTABLE CHEST 1 VIEW COMPARISON:  Chest radiograph dated 04/13/2021 FINDINGS: Lines/tubes: Endotracheal tube tip projects 5.6 cm above the carina. Lungs: New opacification of the right lung. Linear left retrocardiac opacities. Pleura: Large right pleural effusion.  No pneumothorax. Heart/mediastinum: Left upper porta is obscured. Bones: No acute osseous abnormality. IMPRESSION: 1. Endotracheal tube tip projects 5.6 cm above the carina. 2. New opacification of the right lung, likely a combination of atelectasis and large pleural effusion. 3. Linear left retrocardiac opacities, likely  atelectasis. Electronically Signed   By: Limin  Xu M.D.   On: 08/05/2023 14:46   DG C-Arm 1-60 Min-No Report Result Date: 08/05/2023 Fluoroscopy was utilized by the requesting physician.  No radiographic interpretation.   DG C-Arm 1-60 Min-No Report Result Date: 08/05/2023 Fluoroscopy was utilized by the requesting physician.  No radiographic interpretation.   DG C-Arm 1-60 Min-No Report Result Date: 08/05/2023 Fluoroscopy was utilized by the requesting physician.  No radiographic interpretation.   US  OR NERVE BLOCK-IMAGE ONLY [facility]) Result Date: 08/05/2023 There is no interpretation for this exam.  This order is for images obtained during a surgical procedure.  Please See "Surgeries" Tab for more information regarding the procedure.     Medications:    epinephrine  2 mcg/min (08/06/23 0719)   norepinephrine (LEVOPHED) Adult infusion Stopped (08/06/23 7829)   piperacillin -tazobactam (ZOSYN )  IV 2.25 g (08/06/23 0951)   propofol  (DIPRIVAN ) infusion 10 mcg/kg/min (08/06/23 0719)    amiodarone   200 mg Oral BID   Chlorhexidine  Gluconate Cloth  6 each Topical Q0600   diphenhydrAMINE   25 mg Intravenous Q8H   docusate  100 mg Per Tube BID   hydrocerin   Topical Daily   insulin  aspart  0-9 Units Subcutaneous Q4H   iron  polysaccharides  150 mg Oral Daily   methylPREDNISolone  (SOLU-MEDROL ) injection  40 mg Intravenous Q24H   mouth rinse  15 mL Mouth Rinse Q2H   pantoprazole (PROTONIX) IV  40 mg Intravenous Daily   polyethylene glycol  17 g Per Tube Daily   rosuvastatin   40 mg Oral Daily   alteplase , alum & mag hydroxide-simeth, fentaNYL  (SUBLIMAZE ) injection, fentaNYL  (SUBLIMAZE ) injection, guaiFENesin -dextromethorphan, heparin , hydrALAZINE , HYDROmorphone  (DILAUDID ) injection, labetalol, metoprolol tartrate, ondansetron , mouth rinse, oxyCODONE -acetaminophen , phenol, potassium chloride  Assessment/ Plan:  Adam Howell is a 63 y.o.  male with past medical history of diabetes,  hypertension, CAD, Rt AKA, anemia, CHF, and ESRD on dialysis. Patient presents to vascular surgery for revision of AVF. He is being admitted for End stage renal disease (HCC) [N18.6] ESRD (end stage renal disease) (HCC) [N18.6]  [facility] /MWF/left aVF  End-stage renal disease with hyperkalemia on hemodialysis.  Potassium remains 5.5, will order one-time dose of Lokelma 10 g.  Next dialysis scheduled for Saturday.  2. Anemia of chronic kidney disease Lab Results  Component Value Date   HGB 10.6 (L) 08/06/2023    Hgb at goal.  No need for ESA's at this time.   3. Secondary Hyperparathyroidism: with outpatient labs: PTH 410, phosphorus 4.7, calcium  8.8 on 07/19/23.    Lab Results  Component Value Date   CALCIUM  8.8 (L) 08/06/2023   CAION 1.06 (L) 08/05/2023   PHOS 3.0 08/06/2023    Bone minerals within optimal range.  Will continue to monitor during this admission.     LOS: 1 Khylee Algeo 5/23/202511:53 AM

## 2023-08-06 NOTE — Progress Notes (Signed)
 Initial Nutrition Assessment  DOCUMENTATION CODES:   Obesity unspecified  INTERVENTION:   -If pt unable to extubate within 48 hours, consider:  Initiate Pivot 1.5 @ 25 ml/hr and increase by 10 ml every 4 hours to goal rate of 35 ml/hr.   60 ml Prosource TF 4 times daily  30 ml free water flush every 4 hours  Tube feeding regimen provides 1580 kcal (100% of needs), 159 grams of protein, and 630 ml of H2O.  Total free water: 810 ml daily  -Renal MVI daily per tube -1 packet Juven BID per tube, each packet provides 95 calories, 2.5 grams of protein (collagen), and 9.8 grams of carbohydrate (3 grams sugar); also contains 7 grams of L-arginine and L-glutamine, 300 mg vitamin C, 15 mg vitamin E, 1.2 mcg vitamin B-12, 9.5 mg zinc, 200 mg calcium , and 1.5 g  Calcium  Beta-hydroxy-Beta-methylbutyrate to support wound healing   NUTRITION DIAGNOSIS:   Inadequate oral intake related to inability to eat as evidenced by NPO status.  GOAL:   Provide needs based on ASPEN/SCCM guidelines  MONITOR:   Vent status  REASON FOR ASSESSMENT:   Ventilator    ASSESSMENT:   Pt with past medical history significant for ESRD on HD, HFrEF, hypertension, hyperlipidemia presenting for elective AV fistula revision.  PermCath placement complicated by bleeding, concern for potential anaphylactic reaction to IV contrast, and shock.  Pt admitted with anaphylactic vs hemorrhagic shock.   5/22- s/p Jump graft revision of left radiocephalic arteriovenous fistula with 6 mm Artegraft, Ligation of left radiocephalic AV fistula  Patient is currently intubated on ventilator support. OGT connected to low, intermittent suction; KUB on 08/05/23 revealed tip of tube in stomach.  MV: 8.8 L/min Temp (24hrs), Avg:98.8 F (37.1 C), Min:97 F (36.1 C), Max:100.2 F (37.9 C)  Propofol : 6.1 ml/hr (provides 161 kcals daily)  Reviewed I/O's: +1.5 L x 24 hours   OGT output: 0 ml x 24 hours  Per [facility] notes, pt with DM  foot ulcer on lt foot and cellulits and lt lower extremity.   Case discussed with RN, plan is to place chest tube to the right chest due to bleeding into the lung cavity.   Case discussed with RN, MD, and during ICU rounds. Pt was to undergo A/V fistula revision, however, pt experienced complications, including anaphylactic reaction to IV contract. Pt with rt groin HD cath for access (last HD 08/05/23). Pt opens eyes spontaneous and moves arms. Pt has a small amount of UOP at baseline.   No plans to start nutrition today.   Unsure of EDW. Reviewed wt hx; pt has experienced a 15.1% wt loss over the past 6 months, which is significant for time frame.   Medications reviewed and include colace, solu-medrol , protonix, miralax , levophed, adrenalin , and zisyn.   Labs reviewed: Na: 132, K: 5.5, CBGS: 143-158 (inpatient orders for glycemic control are 0-9 units insulin  aspart every 4 hours).    NUTRITION - FOCUSED PHYSICAL EXAM:  Flowsheet Row Most Recent Value  Orbital Region No depletion  Upper Arm Region No depletion  Thoracic and Lumbar Region No depletion  Buccal Region No depletion  Temple Region No depletion  Clavicle Bone Region No depletion  Clavicle and Acromion Bone Region No depletion  Scapular Bone Region No depletion  Dorsal Hand No depletion  Patellar Region No depletion  Anterior Thigh Region No depletion  Posterior Calf Region No depletion  Edema (RD Assessment) Mild  Hair Reviewed  Eyes Reviewed  Mouth Reviewed  Skin Reviewed  Nails Reviewed       Diet Order:   Diet Order     None       EDUCATION NEEDS:   Not appropriate for education at this time  Skin:  Skin Assessment: Skin Integrity Issues: Skin Integrity Issues:: Diabetic Ulcer, Other (Comment) Stage II: - Diabetic Ulcer: lt foot Other: cellulitis to lt leg  Last BM:  Unknown  Height:   Ht Readings from Last 1 Encounters:  08/05/23 6' (1.829 m)    Weight:   Wt Readings from Last 1  Encounters:  08/05/23 102.1 kg    Ideal Body Weight:  74.4 kg (adjusted for rt AKA)  BMI:  Body mass index is 30.53 kg/m.  Estimated Nutritional Needs:   Kcal:  3329-5188  Protein:  > 149 grams  Fluid:  1000 ml + UOP    Herschel Lords, RD, LDN, CDCES Registered Dietitian III Certified Diabetes Care and Education Specialist If unable to reach this RD, please use "RD Inpatient" group chat on secure chat between hours of 8am-4 pm daily

## 2023-08-06 NOTE — Procedures (Signed)
 Chest Tube Procedure Note   INDICATION:  HEMOTHORAX  PROCEDURE OPERATOR: _ALESKEROV MD    CONSENT:   Consent was obtained from  Gurdon speed (daughter) prior to the procedure. Indications, risks, and benefits were explained at length.     PROCEDURE SUMMARY:   A time out was performed and after the chest x-ray was reviewed, the appropriate side was confirmed and marked. My hands were washed immediately prior to the procedure. I wore a surgical cap, mask with protective eyewear, sterile gown and sterile gloves throughout the procedure. The patient was prepped and draped in a sterile manner using chlorhexidine  scrub after the patient was positioned in the usual fashion. A total of 10 ml of 1% lidocaine  was used to anesthesize the skin, subcutaneous tissue, superior aspect of the rib periosteum and parietal pleura. A 1.5 cm incision was then made parallel to the rib in the midaxillary line at the level of the right 5th rib. The subcutaneous tissue superficial and superior to the rib was dissected bluntly to the level of the pleura. The pleura was then entered bluntly. Dark blood was noted from the pleural space. The disruption in the parietal pleura was expanded bluntly and a finger was inserted and swept carefully in all directions. A 28French chest tube was then inserted using my finger as a guide. The chest tube was directed posteriorly and inserted easily. The chest tube was sutured to the skin at the insertion site, and connected securely with tape to a pleurovac. A sterile occlusive dressing was placed over the insertion site. No immediate complications were noted. A post-procedure chest x-ray is pending at the time of this note.  Estimated blood loss is as excpected <5cc.  Patient had appx of dark blood drained to atrium from right pleural space. Vital signs improved post procedure. CXR was ordered after procedure.     Mimie Goering, M.D.  Pulmonary & Critical Care Medicine  [facility]

## 2023-08-06 NOTE — Progress Notes (Signed)
 During dialysis patient became hypotensive still on the Epi gtt, Bridget NP Notified, orders to start Levo at 4, and to give Albumin . After giving Albumin , starting Levo and Continued Epi patient became hypertensive, Bridget NP Notified, orders to stop levo and start when needed, and drop Epi to 2.   Kenyon Pears, 08/04/2023

## 2023-08-06 NOTE — Progress Notes (Signed)
 [facility]  Daily Progress Note   Subjective  -   Patient remains intubated and sedated.  Unable to provide history.  Chest x-ray this morning shows stable hemothorax in the right chest.  Hemoglobin is stable.  Appreciate critical care service and nephrology input and assistance.  Objective Vitals:   08/06/23 0738 08/06/23 1110 08/06/23 1145 08/06/23 1159  BP: 94/68  100/67 96/61  Pulse: 84   82  Resp: 20   16  Temp:   98.2 F (36.8 C) 98.2 F (36.8 C)  TempSrc:   Axillary Axillary  SpO2: 100% 98%    Weight:      Height:        Intake/Output Summary (Last 24 hours) at 08/06/2023 1337 Last data filed at 08/06/2023 0900 Gross per 24 hour  Intake 725.49 ml  Output 100 ml  Net 625.49 ml    PULM  coarse and diminished bilaterally CV  irregular VASC  I do not feel a thrill in the jump graft portion of the AV fistula.  Laboratory CBC    Component Value Date/Time   WBC 14.5 (H) 08/06/2023 0557   HGB 10.6 (L) 08/06/2023 0557   HGB 16.8 01/12/2023 1414   HCT 31.8 (L) 08/06/2023 0557   HCT 51.0 01/12/2023 1414   PLT 152 08/06/2023 0557   PLT 257 01/12/2023 1414    BMET    Component Value Date/Time   NA 132 (L) 08/06/2023 0549   NA 136 04/14/2023 1610   K 5.5 (H) 08/06/2023 0549   CL 95 (L) 08/06/2023 0549   CO2 25 08/06/2023 0549   GLUCOSE 174 (H) 08/06/2023 0549   BUN 29 (H) 08/06/2023 0549   BUN 18 04/14/2023 1610   CREATININE 6.85 (H) 08/06/2023 0549   CALCIUM  8.8 (L) 08/06/2023 0549   GFRNONAA 8 (L) 08/06/2023 0549    Assessment/Planning: POD #1 s/p jump graft for revision of AV fistula, PermCath placement, venogram with intervention, central line placement  Remains intubated and sedated Right hemothorax stable and hemoglobin is stable so doubt continued bleeding, and agree with critical care service that chest tube being placed would help his pulmonary function and lower the risk of infection Left arm AV graft with loss of bruit  and thrill.  As he is unstable on pressors, this is a less pressing issue at this point this will have to be addressed in the future.  It may result in loss of the access, but he is not stable for any sort of intervention to this at this point. Appreciate critical care service with care of the patient. Attempted to call the daughter twice with no success.  Did leave a message.   Mikki Alexander  08/06/2023, 1:37 PM

## 2023-08-06 NOTE — Progress Notes (Signed)
 NAME:  Adam Howell, MRN:  [id number], DOB:  1961/02/14, LOS: 1 ADMISSION DATE:  08/05/2023, CONSULTATION DATE:  08/05/2023 REFERRING MD:  Dr. Vonna Guardian, CHIEF COMPLAINT:  Shock, post-op vent managment   Brief Pt Description / Synopsis:  63 year old male with past medical history significant for ESRD on HD, HFrEF, hypertension, hyperlipidemia presenting for elective AV fistula revision.  PermCath placement complicated by bleeding, concern for potential anaphylactic reaction to IV contrast, and shock.  Returns to ICU postprocedure and remains intubated.  History of Present Illness:  Adam Howell is a 63 y.o male with past medical history significant for ESRD on HD, HFrEF, HTN, HLD who presented to [facility] on 08/05/23 for elective revision of his AV fistula as is was noted to be quite aneurysmal with scabs and impending skin threat putting it at high risk for bleeding.  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  The patient became hypotensive with SBP in the 60's concerning for bleeding in the diseased central venous system verses possible IV contrast reaction.  Fluroscopy did show some blood in the pleural cavity concerning for hemothorax.     Femoral temporary Dialysis catheter was placed along with femoral central line placement. Consideration was given for placing a chest tube, but it was felt this may result in increased bleeding as the pressure from the existing hematoma would help tamponade any further venous bleeding and if we remove this into the cavity further bleeding would likely ensue. Also of note, the patient had been on Eliquis  up till yesterday and had received heparin  during the revision of his fistula.     08/06/23- patient on VRE contact precautions. Hemothorax noted on right chest.  Will give FFP and chest tube placement today.  There is some mediastinal shift and possible tension ,  patient requiring vasopressor.  I discussed with vascular surgeon Dr Vonna Guardian and daughter POA of patient.  We plan to place chest tube on right for hemothorax.    Pertinent  Medical History   Past Medical History:  Diagnosis Date   Anemia in chronic kidney disease    Cellulitis of left lower extremity    Chronic atrial fibrillation (HCC)    Chronic kidney disease    Congestive heart failure (HCC)    Difficult intubation    End-stage renal disease on hemodialysis (HCC)    Hyperlipidemia    Hypertension    Type 2 diabetes mellitus with complication (HCC)     Micro Data:  5/22: MRSA PCR>>negative  Antimicrobials:   Anti-infectives (From admission, onward)    Start     Dose/Rate Route Frequency Ordered Stop   08/06/23 0900  piperacillin -tazobactam (ZOSYN ) IVPB 2.25 g        2.25 g 100 mL/hr over 30 Minutes Intravenous Every 8 hours 08/06/23 0812     08/05/23 0600  ceFAZolin  (ANCEF ) IVPB 2g/100 mL premix        2 g 200 mL/hr over 30 Minutes Intravenous On call to O.R. 08/04/23 2248 08/05/23 1143       Significant Hospital Events: Including procedures, antibiotic start and stop dates in addition to other pertinent events   5/22: Presented for elective AV fistula revision.  During Permcath placement, concern for bleeding and hemothorax.  Also with concern for anaphylaxis with anaphylactic shock following IV contrast requiring Epi gtt.  Returns to ICU post procedure and remains intubated.  PCCM consulted.  Nephrology consulted  for HD.  Interim History / Subjective:  As outlined above under "Significant Hospital Events" section  Objective    Blood pressure 94/68, pulse 84, temperature 98.7 F (37.1 C), temperature source Axillary, resp. rate 20, height 6' (1.829 m), weight 102.1 kg, SpO2 100%.    Vent Mode: PRVC FiO2 (%):  [35 %-60 %] 35 % Set Rate:  [16 bmp] 16 bmp Vt Set:  [550 mL] 550 mL PEEP:  [8 cmH20] 8 cmH20 Plateau Pressure:  [21 cmH20-23 cmH20] 22 cmH20    Intake/Output Summary (Last 24 hours) at 08/06/2023 0817 Last data filed at 08/06/2023 0701 Gross per 24 hour  Intake 1443.99 ml  Output 0 ml  Net 1443.99 ml   Filed Weights   08/05/23 0627 08/05/23 1930 08/05/23 2310  Weight: 102.1 kg 102.1 kg 102.1 kg    Examination: General: Critically ill-appearing male, laying in bed, intubated and sedated, no acute distress HENT: Atraumatic, normocephalic, neck supple, difficult to assess JVD due to body habitus Lungs: Mechanical breath sounds throughout, even, nonlabored, synchronous with the vent Cardiovascular: Irregular irregular rhythm, rate controlled, no murmurs, rubs, gallops Abdomen: Obese, soft, nontender, nondistended, no guarding or rebound tenderness, bowel sounds positive x 4 Extremities: Right BKA, chronic wounds to left shin (see images below) Neuro: Sedated, currently does not withdraw from pain or follow commands, pupils PERRLA GU: Deferred           Resolved Hospital Problem list     Assessment & Plan:   #1. Ciculatory Shock: Anaphylactic vs  tension from large hemothorax with mediastinal shift.  #Mildly Elevated Troponin, suspect demand ischemia #Chronic Atrial Fibrillation #Chronic HFrEF PMHx: CHF,HTN, HLD Echocardiogram 01/10/20: LVEF 35-40%, unable to evaluate diastolic function, RV systolic function low normal, mildly elevated pulmonary artery systolic pressure -Continuous cardiac monitoring -Maintain MAP >65 -Cautious IV fluids given ESRD -Transfusions as indicated -Vasopressors as needed to maintain MAP goal ~ Epi gtt -Trend lactic acid until normalized -HS Troponin flat at 76 -Consider Echocardiogram  -Volume removal with HD -Hold home Eliquis  for now given concern for possible hemothorax  -Hold home Coreg  for now  #Suspected Anaphylactic Reaction due to IV contrast -Epi gtt -IV Steroids -IV Benadryl  -Avoid further IV contrast  #Post-Op Ventilator Management #Concern for possible  Hemothorax -Full vent support, implement lung protective strategies -Plateau pressures less than 30 cm H20 -Wean FiO2 & PEEP as tolerated to maintain O2 sats >92% -Follow intermittent Chest X-ray & ABG as needed -Spontaneous Breathing Trials when respiratory parameters met and mental status permits -Implement VAP Bundle -Bronchodilators -IV steroids -CT Chest is pending -Discussed with Dr. Fabiano Ginley, avoid placing chest tube at this time  #ESRD on HD #Hyperkalemia #Mild Hyponatremia #AV Fistula revision -Monitor I&O's / urinary output -Follow BMP -Ensure adequate renal perfusion -Avoid nephrotoxic agents as able -Replace electrolytes as indicated ~ Pharmacy following for assistance with electrolyte replacement -Nephrology consulted, appreciate input ~ HD as per Nephrology  #Acute Blood Loss Anemia due to suspected Hemothorax -Monitor for S/Sx of bleeding -Trend CBC -SCD's for VTE Prophylaxis (hold DVT ppx/home Eliquis  for now) -Transfuse for Hgb <7  #Diabetes Mellitus -CBG's q4h; Target range of 140 to 180 -SSI -Follow ICU Hypo/Hyperglycemia protocol  #Sedation needs in setting of mechanical ventilation -Maintain a RASS goal of 0 to -1 -Fentanyl  and Propofol  as needed to maintain RASS goal -Avoid sedating medications as able -Daily wake up assessment      Best Practice (right click and "Reselect all SmartList Selections" daily)   Diet/type: NPO DVT  prophylaxis: SCD GI prophylaxis: PPI Lines: Central line, Dialysis Catheter, and yes and it is still needed Foley:  N/A Code Status:  full code Last date of multidisciplinary goals of care discussion [N/A]  5/22: Will update pt's family when they arrive at bedside on plan of care.  Labs   CBC: Recent Labs  Lab 08/05/23 0654 08/05/23 1305 08/05/23 2143 08/06/23 0557  WBC 3.8* 15.0* 13.5* 14.5*  NEUTROABS 1.7 12.6*  --   --   HGB 14.5  16.3 11.9* 11.3* 10.6*  HCT 45.4  48.0 37.1* 35.4* 31.8*  MCV 88.2  88.5 87.6 85.9  PLT 108* 160 151 152    Basic Metabolic Panel: Recent Labs  Lab 08/05/23 0654 08/05/23 1305  NA 135  136 133*  K 4.2  4.2 5.5*  CL 93*  99 95*  CO2 30 26  GLUCOSE 102*  102* 243*  BUN 32*  31* 34*  CREATININE 8.47*  8.40* 8.72*  CALCIUM  9.1 8.1*   GFR: Estimated Creatinine Clearance: 10.9 mL/min (A) (by C-G formula based on SCr of 8.72 mg/dL (H)). Recent Labs  Lab 08/05/23 0654 08/05/23 1305 08/05/23 1425 08/05/23 2143 08/06/23 0557  WBC 3.8* 15.0*  --  13.5* 14.5*  LATICACIDVEN  --  1.7 2.5*  --   --     Liver Function Tests: Recent Labs  Lab 08/05/23 1305  AST 17  ALT 17  ALKPHOS 84  BILITOT 0.8  PROT 6.5  ALBUMIN  3.2*   No results for input(s): "LIPASE", "AMYLASE" in the last 168 hours. No results for input(s): "AMMONIA" in the last 168 hours.  ABG    Component Value Date/Time   PHART 7.43 08/05/2023 1313   PCO2ART 40 08/05/2023 1313   PO2ART 82 (L) 08/05/2023 1313   HCO3 26.5 08/05/2023 1313   TCO2 30 08/05/2023 0654   O2SAT 98.3 08/05/2023 1313     Coagulation Profile: Recent Labs  Lab 08/05/23 1305  INR 1.3*    Cardiac Enzymes: No results for input(s): "CKTOTAL", "CKMB", "CKMBINDEX", "TROPONINI" in the last 168 hours.  HbA1C: HB A1C (BAYER DCA - WAIVED)  Date/Time Value Ref Range Status  01/12/2023 02:13 PM 5.9 (H) 4.8 - 5.6 % Final    Comment:             Prediabetes: 5.7 - 6.4          Diabetes: >6.4          Glycemic control for adults with diabetes: <7.0   10/12/2022 02:20 PM 5.9 (H) 4.8 - 5.6 % Final    Comment:             Prediabetes: 5.7 - 6.4          Diabetes: >6.4          Glycemic control for adults with diabetes: <7.0    Hgb A1c MFr Bld  Date/Time Value Ref Range Status  02/16/2023 03:54 PM 6.4 (H) 4.8 - 5.6 % Final    Comment:    (NOTE) Pre diabetes:          5.7%-6.4%  Diabetes:              >6.4%  Glycemic control for   <7.0% adults with diabetes     CBG: Recent Labs  Lab  08/05/23 1531 08/05/23 1929 08/06/23 0016 08/06/23 0413 08/06/23 0728  GLUCAP 184* 140* 143* 158* 143*    Review of Systems:   Unable to assess due to intubation/sedation/critical illness  Past Medical History:  He,  has a past medical history of Anemia in chronic kidney disease, Cellulitis of left lower extremity, Chronic atrial fibrillation (HCC), Chronic kidney disease, Congestive heart failure (HCC), Difficult intubation, End-stage renal disease on hemodialysis (HCC), Hyperlipidemia, Hypertension, and Type 2 diabetes mellitus with complication (HCC).   Surgical History:   Past Surgical History:  Procedure Laterality Date   A/V FISTULAGRAM Left 08/27/2022   Procedure: A/V Fistulagram;  Surgeon: Celso College, MD;  Location: [facility]AS[facility];  Service: Cardiovascular;  Laterality: Left;   AMPUTATION Right 03/13/2022   Procedure: AMPUTATION ABOVE KNEE;  Surgeon: Celso College, MD;  Location: [facility] ORS;  Service: General;  Laterality: Right;   APPLICATION OF WOUND VAC Right 03/07/2022   Procedure: APPLICATION OF WOUND VAC;  Surgeon: Lesta Rater, MD;  Location: [facility] ORS;  Service: Vascular;  Laterality: Right;  GAAC [id number]   LOWER EXTREMITY ANGIOGRAPHY Left 02/15/2023   Procedure: Lower Extremity Angiography;  Surgeon: Celso College, MD;  Location: [facility];  Service: Cardiovascular;  Laterality: Left;   WOUND DEBRIDEMENT Left    WOUND DEBRIDEMENT Right 01/16/2022   Procedure: DEBRIDEMENT WOUND;  Surgeon: Jackquelyn Mass, MD;  Location: [facility] ORS;  Service: Vascular;  Laterality: Right;  Wound vac placement   WOUND DEBRIDEMENT Right 03/07/2022   Procedure: DEBRIDEMENT WOUND RIGHT LOWER EXTREMITY;  Surgeon: Lesta Rater, MD;  Location: [facility] ORS;  Service: Vascular;  Laterality: Right;     Social History:   reports that he has never smoked. He has never used smokeless tobacco. He reports that he does not currently use alcohol. He reports that he does not use drugs.    Family History:  His family history includes Cirrhosis in his father; Heart failure in his mother; Hypertension in his daughter.   Allergies Allergies  Allergen Reactions   Tramadol  Rash     Home Medications  Prior to Admission medications   Medication Sig Start Date End Date Taking? Authorizing Provider  amiodarone  (PACERONE ) 200 MG tablet Take 1 tablet (200 mg total) by mouth 2 (two) times daily. 10/12/22  Yes Cannady, Jolene T, NP  aspirin  EC 81 MG tablet Take 1 tablet (81 mg total) by mouth daily. Swallow whole. 03/05/23  Yes Althia Atlas, MD  carvedilol  (COREG ) 6.25 MG tablet Take 6.25 mg by mouth daily. 06/25/23  Yes [provider]  ELIQUIS  2.5 MG TABS tablet Take 1 tablet (2.5 mg total) by mouth 2 (two) times daily. 10/12/22  Yes Cannady, Jolene T, NP  iron  polysaccharides (NIFEREX) 150 MG capsule Take 1 capsule (150 mg total) by mouth daily. 03/05/23  Yes Althia Atlas, MD  melatonin 3 MG TABS tablet Take 3 mg by mouth at bedtime.   Yes [provider]  midodrine  (PROAMATINE ) 5 MG tablet Take 5 mg by mouth as needed (post dialysis hypotension SBP<90 or DBP<55).   Yes [provider]  rosuvastatin  (CRESTOR ) 40 MG tablet Take 1 tablet (40 mg total) by mouth daily. 10/12/22  Yes Cannady, Jolene T, NP  SANTYL  250 UNIT/GM ointment Apply 1 Application topically daily. 02/08/23  Yes [provider]  senna (SENOKOT) 8.6 MG TABS tablet Take 2 tablets (17.2 mg total) by mouth at bedtime. 04/03/22  Yes Love, Renay Carota, PA-C  sevelamer  carbonate (RENVELA ) 800 MG tablet Take 2,400 mg by mouth 3 (three) times daily. 09/21/22  Yes [provider]  traZODone  (DESYREL ) 100 MG tablet Take 1 tablet (100 mg total) by mouth  at bedtime. 01/12/23  Yes Cannady, Jolene T, NP  acetaminophen  (TYLENOL ) 325 MG tablet Take 2 tablets (650 mg total) by mouth every 6 (six) hours as needed for mild pain (pain score 1-3), fever or headache (or Fever >/= 101). 03/04/23   Althia Atlas, MD  oxyCODONE -acetaminophen  (PERCOCET) 10-325 MG tablet Take 1 tablet by mouth every 6 (six) hours as needed for pain. 08/05/23   Celso College, MD     Critical care provider statement:   Total critical care time: 63 minutes   Performed by: Jaclynn Mast MD   Critical care time was exclusive of separately billable procedures and treating other patients.   Critical care was necessary to treat or prevent imminent or life-threatening deterioration.   Critical care was time spent personally by me on the following activities: development of treatment plan with patient and/or surrogate as well as nursing, discussions with consultants, evaluation of patient's response to treatment, examination of patient, obtaining history from patient or surrogate, ordering and performing treatments and interventions, ordering and review of laboratory studies, ordering and review of radiographic studies, pulse oximetry and re-evaluation of patient's condition.    Danel Requena, M.D.  Pulmonary & Critical Care Medicine

## 2023-08-06 NOTE — Progress Notes (Addendum)
 RN spoke with the patient's  biological daughter Adam Howell) at the bedside this evening after chest tube insertion. This evening Michelle's step mother who lives in [place]  called and reports she wanted to be the one notified about the patient's condition but Adam Andrews prefers to be the one updated/informed as she is the one that lives in [place] , The step mother lives in [place] .

## 2023-08-06 NOTE — Progress Notes (Signed)
 Sputum sample still needs to be collected.

## 2023-08-06 NOTE — TOC Initial Note (Signed)
 Transition of Care [facility]) - Initial/Assessment Note    Patient Details  Name: Adam Howell MRN: [id number] Date of Birth: 17-Feb-1961  Transition of Care [facility]) CM/SW Contact:    [place] L Truxton Stupka, LCSW Phone Number: 08/06/2023, 11:49 AM  Clinical Narrative:          CSW spoke with patient's daughter. Patient is intubated and sedated at this time. Ms. Bernette Brim reported that patient resides at [facility] facility and if discharged he will return there. Daughter advised that if possible she would like for beds to be located for patient. Daughter advised that there is no preference.          Expected Discharge Plan: Skilled Nursing Facility     Patient Goals and CMS Choice            Expected Discharge Plan and Services       Living arrangements for the past 2 months: Skilled Nursing Facility Expected Discharge Date: 08/05/23                                    Prior Living Arrangements/Services Living arrangements for the past 2 months: Skilled Nursing Facility   Patient language and need for interpreter reviewed:: No (Patient Intubated)        Need for Family Participation in Patient Care: Yes (Comment) Care giver support system in place?: Yes (comment) (SNF)   Criminal Activity/Legal Involvement Pertinent to Current Situation/Hospitalization: No - Comment as needed  Activities of Daily Living   ADL Screening (condition at time of admission) Independently performs ADLs?: Yes (appropriate for developmental age) Is the patient deaf or have difficulty hearing?: No Does the patient have difficulty seeing, even when wearing glasses/contacts?: No Does the patient have difficulty concentrating, remembering, or making decisions?: No  Permission Sought/Granted                  Emotional Assessment Appearance:: Appears stated age Attitude/Demeanor/Rapport: Unable to Assess Affect (typically observed): Unable to Assess        Admission diagnosis:   End stage renal disease (HCC) [N18.6] ESRD (end stage renal disease) (HCC) [N18.6] Patient Active Problem List   Diagnosis Date Noted   ESRD (end stage renal disease) (HCC) 08/05/2023   Infected ulcer of skin (HCC) 02/09/2023   Ulcer of left lower extremity, limited to breakdown of skin (HCC) 12/10/2022   S/P AKA (above knee amputation), right (HCC) 03/15/2022   Hypotension 03/08/2022   Wound infection of chronic right lower extremity ulcer 01/13/2022   Controlled substance agreement signed 04/10/2021   Other thrombophilia (HCC) 01/19/2020   Left leg pain 12/25/2019   Atrial fibrillation, chronic (HCC) 11/11/2019   Hypertension 11/11/2019   Hyperlipidemia associated with type 2 diabetes mellitus (HCC) 11/11/2019   Erectile dysfunction 11/11/2019   Heart failure with reduced ejection fraction (HCC) 10/26/2019   Type 2 diabetes mellitus with ESRD (end-stage renal disease) (HCC) 10/04/2019   Chronic pain syndrome 10/04/2019   Insomnia 10/04/2019   Atherosclerotic heart disease of native coronary artery without angina pectoris 09/01/2019   Hypertensive heart and kidney disease with heart failure and chronic kidney disease stage V (HCC) 09/01/2019   Obesity (BMI 30-39.9) 09/01/2019   Anemia in chronic kidney disease 07/25/2019   ESRD on dialysis (HCC) 07/25/2019   Peripheral vascular disease (HCC) 07/25/2019   Secondary hyperparathyroidism of renal origin (HCC) 07/25/2019   PCP:  Lemar Pyles, NP Pharmacy:  [facility] [address] [place] - [address] [place] [address] Phone: [phone] Fax: [phone]  [facility] [address]), [place] - [address] [place] [address]) [place] [address] Phone: [phone] Fax: [phone]  [facility] [place], [place] - [address] [place] [address] Phone: [phone] Fax: [phone]     Social Drivers of  Health (SDOH) Social History: SDOH Screenings   Food Insecurity: No Food Insecurity (02/09/2023)  Housing: Unknown (02/25/2023)  Transportation Needs: No Transportation Needs (02/09/2023)  Recent Concern: Transportation Needs - Unmet Transportation Needs (12/29/2022)  Utilities: Not At Risk (02/09/2023)  Alcohol Screen: Low Risk  (11/25/2021)  Depression (PHQ2-9): Low Risk  (04/14/2023)  Financial Resource Strain: Low Risk  (11/25/2021)  Physical Activity: Insufficiently Active (11/25/2021)  Social Connections: Moderately Isolated (12/29/2022)  Stress: No Stress Concern Present (11/25/2021)  Tobacco Use: Low Risk  (08/05/2023)   SDOH Interventions:     Readmission Risk Interventions    02/16/2023    6:21 PM  Readmission Risk Prevention Plan  Transportation Screening Complete  Palliative Care Screening Not Applicable  Medication Review (RN Care Manager) Complete

## 2023-08-07 ENCOUNTER — Inpatient Hospital Stay

## 2023-08-07 LAB — BPAM FFP
Blood Product Expiration Date: 202505232359
ISSUE DATE / TIME: 202505231152
Unit Type and Rh: 600

## 2023-08-07 LAB — RENAL FUNCTION PANEL
Albumin: 2.8 g/dL — ABNORMAL LOW (ref 3.5–5.0)
Anion gap: 13 (ref 5–15)
BUN: 46 mg/dL — ABNORMAL HIGH (ref 8–23)
CO2: 25 mmol/L (ref 22–32)
Calcium: 8.4 mg/dL — ABNORMAL LOW (ref 8.9–10.3)
Chloride: 94 mmol/L — ABNORMAL LOW (ref 98–111)
Creatinine, Ser: 8.41 mg/dL — ABNORMAL HIGH (ref 0.61–1.24)
GFR, Estimated: 7 mL/min — ABNORMAL LOW (ref >60)
Glucose, Bld: 154 mg/dL — ABNORMAL HIGH (ref 70–99)
Phosphorus: 3.9 mg/dL (ref 2.5–4.6)
Potassium: 4.7 mmol/L (ref 3.5–5.1)
Sodium: 132 mmol/L — ABNORMAL LOW (ref 135–145)

## 2023-08-07 LAB — CBC
HCT: 24.8 % — ABNORMAL LOW (ref 39.0–52.0)
Hemoglobin: 8.4 g/dL — ABNORMAL LOW (ref 13.0–17.0)
MCH: 28.4 pg (ref 26.0–34.0)
MCHC: 33.9 g/dL (ref 30.0–36.0)
MCV: 83.8 fL (ref 80.0–100.0)
Platelets: 141 10*3/uL — ABNORMAL LOW (ref 150–400)
RBC: 2.96 MIL/uL — ABNORMAL LOW (ref 4.22–5.81)
RDW: 15.6 % — ABNORMAL HIGH (ref 11.5–15.5)
WBC: 14.6 10*3/uL — ABNORMAL HIGH (ref 4.0–10.5)
nRBC: 0 % (ref 0.0–0.2)

## 2023-08-07 LAB — MISC LABCORP TEST (SEND OUT): Labcorp test code: 6510

## 2023-08-07 LAB — HEPATITIS B SURFACE ANTIBODY, QUANTITATIVE: Hep B S AB Quant (Post): 847 m[IU]/mL

## 2023-08-07 LAB — GLUCOSE, CAPILLARY
Glucose-Capillary: 153 mg/dL — ABNORMAL HIGH (ref 70–99)
Glucose-Capillary: 135 mg/dL — ABNORMAL HIGH (ref 70–99)
Glucose-Capillary: 88 mg/dL (ref 70–99)
Glucose-Capillary: 92 mg/dL (ref 70–99)
Glucose-Capillary: 120 mg/dL — ABNORMAL HIGH (ref 70–99)
Glucose-Capillary: 164 mg/dL — ABNORMAL HIGH (ref 70–99)

## 2023-08-07 LAB — LACTIC ACID, PLASMA: Lactic Acid, Venous: 0.8 mmol/L (ref 0.5–1.9)

## 2023-08-07 LAB — PREPARE FRESH FROZEN PLASMA

## 2023-08-07 MED ORDER — NOREPINEPHRINE 4 MG/250ML-% IV SOLN
0.0000 ug/min | INTRAVENOUS | Status: DC
  Administered 2023-08-07 – 2023-08-08 (×2): 2 ug/min via INTRAVENOUS
  Administered 2023-08-08: 5 ug/min via INTRAVENOUS
  Filled 2023-08-07 (×3): qty 250

## 2023-08-07 MED ORDER — GLYCOPYRROLATE 0.2 MG/ML IJ SOLN: 0.1000 mg | Freq: Three times a day (TID) | INTRAMUSCULAR | Status: DC

## 2023-08-07 MED ORDER — LACTATED RINGERS IV BOLUS
500.0000 mL | Freq: Once | INTRAVENOUS | Status: AC
Start: 2023-08-07 — End: 2023-08-07
  Administered 2023-08-07: 500 mL via INTRAVENOUS

## 2023-08-07 MED ORDER — EPINEPHRINE HCL 5 MG/250ML IV SOLN IN NS
0.5000 ug/min | INTRAVENOUS | Status: DC
  Administered 2023-08-07: 0.5 ug/min via INTRAVENOUS
  Filled 2023-08-07: qty 250

## 2023-08-07 MED ORDER — METHYLPREDNISOLONE SODIUM SUCC 40 MG IJ SOLR
20.0000 mg | INTRAMUSCULAR | Status: DC
  Administered 2023-08-08: 20 mg via INTRAVENOUS
  Filled 2023-08-07: qty 1

## 2023-08-07 NOTE — Progress Notes (Addendum)
 Dr. Jaclynn Mast has clamped the patient's chest tube at this time. Per Dr. Jaclynn Mast chest tube to be clamped until chest x-ray is obtained and reviewed by the provider tomorrow morning unless the patient's oxygen saturation drops over night and chest tube needs to be place back to wall suction. Chest tube is currently on water seal.

## 2023-08-07 NOTE — Progress Notes (Signed)
 [facility]  ROUNDING NOTE   Subjective:   Adam Howell is a 63 y.o male with past medical history of diabetes, hypertension, CAD, Rt AKA, anemia, CHF, and ESRD on dialysis. Patient presents to vascular surgery for revision of AVF. He is being admitted for End stage renal disease (HCC) [N18.6] ESRD (end stage renal disease) (HCC) [N18.6]  On Epinephrine  gtt.   Dialysis for later today.    Objective:  Vital signs in last 24 hours:  Temp:  [97.5 F (36.4 C)-98.5 F (36.9 C)] 97.7 F (36.5 C) (05/24 0745) Pulse Rate:  [58-87] 66 (05/24 0745) Resp:  [0-20] 16 (05/24 0745) BP: (66-152)/(43-85) 99/60 (05/24 0745) SpO2:  [90 %-100 %] 98 % (05/24 0745) FiO2 (%):  [35 %] 35 % (05/24 1030)  Weight change:  Filed Weights   08/05/23 0627 08/05/23 1930 08/05/23 2310  Weight: 102.1 kg 102.1 kg 102.1 kg    Intake/Output: I/O last 3 completed shifts: In: 2071.4 [I.V.:966.6; Blood:260; NG/GT:240; IV Piggyback:504.8] Out: 2555 [Emesis/NG output:350; Chest Tube:2205]   Intake/Output this shift:  Total I/O In: 75.4 [I.V.:18.3; IV Piggyback:7.1] Out: 80 [Chest Tube:80]  Physical Exam: General: Critically ill  Head: +ETT  Eyes: Anicteric  Lungs:  PRVC FiO2 35%  Heart: Regular rate and rhythm  Abdomen:  Soft, nontender, obese  Extremities: No peripheral edema  Right AKA  Neurologic: Sedated, intubated  Skin: No lesions  Access: Left aVF (no bruit or thrill) right HD femoral temp cath    Basic Metabolic Panel: Recent Labs  Lab 08/05/23 0654 08/05/23 1305 08/06/23 0549 08/07/23 0348  NA 135  136 133* 132* 132*  K 4.2  4.2 5.5* 5.5* 4.7  CL 93*  99 95* 95* 94*  CO2 30 26 25 25   GLUCOSE 102*  102* 243* 174* 154*  BUN 32*  31* 34* 29* 46*  CREATININE 8.47*  8.40* 8.72* 6.85* 8.41*  CALCIUM  9.1 8.1* 8.8* 8.4*  PHOS  --   --  3.0 3.9    Liver Function Tests: Recent Labs  Lab 08/05/23 1305 08/06/23 0549 08/07/23 0348  AST 17  --   --   ALT 17   --   --   ALKPHOS 84  --   --   BILITOT 0.8  --   --   PROT 6.5  --   --   ALBUMIN  3.2* 3.4* 2.8*   No results for input(s): "LIPASE", "AMYLASE" in the last 168 hours. No results for input(s): "AMMONIA" in the last 168 hours.  CBC: Recent Labs  Lab 08/05/23 0654 08/05/23 1305 08/05/23 2143 08/06/23 0557 08/06/23 1451 08/06/23 2104 08/07/23 0348  WBC 3.8* 15.0* 13.5* 14.5*  --  14.0* 14.6*  NEUTROABS 1.7 12.6*  --   --   --   --   --   HGB 14.5  16.3 11.9* 11.3* 10.6* 8.8* 8.4* 8.4*  HCT 45.4  48.0 37.1* 35.4* 31.8* 26.7* 24.7* 24.8*  MCV 88.2 88.5 87.6 85.9  --  84.3 83.8  PLT 108* 160 151 152  --  141* 141*    Cardiac Enzymes: No results for input(s): "CKTOTAL", "CKMB", "CKMBINDEX", "TROPONINI" in the last 168 hours.  BNP: Invalid input(s): "POCBNP"  CBG: Recent Labs  Lab 08/06/23 1540 08/06/23 1949 08/06/23 2322 08/07/23 0351 08/07/23 0734  GLUCAP 165* 175* 169* 153* 135*    Microbiology: Results for orders placed or performed in visit on 07/27/23  CULTURE, URINE COMPREHENSIVE     Status: Abnormal   Collection  Time: 07/27/23  2:28 PM   Specimen: Urine   UR  Result Value Ref Range Status   Urine Culture, Comprehensive Final report (A)  Final   Organism ID, Bacteria Comment (A)  Final    Comment: Escherichia coli, identified by an automated biochemical system. Cefazolin  with an MIC <=16 predicts susceptibility to the oral agents cefaclor, cefdinir, cefpodoxime, cefprozil, cefuroxime, cephalexin , and loracarbef when used for therapy of uncomplicated urinary tract infections due to E. coli, Klebsiella pneumoniae, and Proteus mirabilis. Greater than 100,000 colony forming units per mL    Organism ID, Bacteria Comment  Final    Comment: Mixed urogenital flora 25,000-50,000 colony forming units per mL    ANTIMICROBIAL SUSCEPTIBILITY Comment  Final    Comment:       ** S = Susceptible; I = Intermediate; R = Resistant **                    P = Positive;  N = Negative             MICS are expressed in micrograms per mL    Antibiotic                 RSLT#1    RSLT#2    RSLT#3    RSLT#4 Amoxicillin /Clavulanic Acid    S Ampicillin                      S Cefazolin                       S Cefepime                        S Cefoxitin                      S Cefpodoxime                    S Ceftriaxone                     S Ciprofloxacin                  R Ertapenem                      S Gentamicin                     S Levofloxacin                   R Meropenem                       S Nitrofurantoin                 S Piperacillin /Tazobactam        S Tetracycline                   S Tobramycin                     S Trimethoprim /Sulfa              S     Coagulation Studies: Recent Labs    08/05/23 1305  LABPROT 16.8*  INR 1.3*    Urinalysis: No results for input(s): "COLORURINE", "LABSPEC", "PHURINE", "GLUCOSEU", "HGBUR", "BILIRUBINUR", "KETONESUR", "PROTEINUR", "UROBILINOGEN", "NITRITE", "LEUKOCYTESUR" in the last 72 hours.  Invalid input(s): "APPERANCEUR"  Imaging: DG Chest 1 View Result Date: 08/07/2023 CLINICAL DATA:  Hemothorax EXAM: CHEST  1 VIEW COMPARISON:  Yesterday FINDINGS: Endotracheal tube with tip at the clavicular heads. An enteric tube at least reaches the diaphragm. Right chest tube. Central line from below with tip at the lower right atrium. Cardiac enlargement. There is no edema, consolidation, effusion, or pneumothorax. IMPRESSION: No active disease.  Stable hardware positioning. Electronically Signed   By: Ronnette Coke M.D.   On: 08/07/2023 07:16   DG Chest Port 1 View Result Date: 08/06/2023 CLINICAL DATA:  Status post chest tube placement EXAM: PORTABLE CHEST 1 VIEW COMPARISON:  Chest radiograph dated 08/06/2023 FINDINGS: Lines/tubes: Endotracheal tube tip projects 4.8 cm above the carina. Gastric/enteric tube tip projects over the stomach. Inferior approach central venous catheter tip projects over the lower  right atrium. Interval placement of medial right pleural catheter. Lungs: Improved right lung aeration. Patchy left retrocardiac opacity. Rounded right hilar opacity. Pleura: Markedly decreased right pleural effusion. Small pleural effusion remains. No definite pneumothorax. Heart/mediastinum: Enlarged cardiomediastinal silhouette. Bones: No acute osseous abnormality. Trace subcutaneous emphysema overlying the right lateral chest wall. IMPRESSION: 1. Markedly decreased right pleural effusion status post medial right pleural catheter placement. Small pleural effusion remains. No definite pneumothorax. 2. Improved right lung aeration. 3. Rounded right hilar opacity, which may represent a pulmonary mass or lymphadenopathy. Recommend attention on follow-up. 4. Patchy left retrocardiac opacity, which may represent atelectasis or pneumonia. 5. Cardiomegaly. Electronically Signed   By: Limin  Xu M.D.   On: 08/06/2023 16:48   DG Chest Port 1 View Result Date: 08/06/2023 CLINICAL DATA:  Follow-up pleural effusion EXAM: PORTABLE CHEST 1 VIEW COMPARISON:  Chest CT from the previous day. FINDINGS: Cardiac shadow is stable. Endotracheal tube and gastric catheter are noted in satisfactory position. Right-sided pleural effusion is again identified and stable similar to that seen on the prior exam. No pneumothorax is seen. Left lung remains clear. IMPRESSION: Large right pleural effusion. Tubes and lines as described. Electronically Signed   By: Violeta Grey M.D.   On: 08/06/2023 03:42   CT CHEST WO CONTRAST Result Date: 08/05/2023 CLINICAL DATA:  Status post PermCath placement complicated by bleeding EXAM: CT CHEST WITHOUT CONTRAST TECHNIQUE: Multidetector CT imaging of the chest was performed following the standard protocol without IV contrast. RADIATION DOSE REDUCTION: This exam was performed according to the departmental dose-optimization program which includes automated exposure control, adjustment of the mA and/or kV  according to patient size and/or use of iterative reconstruction technique. COMPARISON:  Same day chest radiograph FINDINGS: Cardiovascular: Multichamber cardiomegaly. Inferior approach central venous catheter tip terminates at the inferior cavoatrial junction. No significant pericardial fluid/thickening. Great vessels are normal in course and caliber. Linear hyperdensity within the right brachiocephalic vein (5:47), which may be postprocedural. Multiple linear densities extending from the posterior aspect of the lower right brachiocephalic vein, for example series 3, image 43, 49. Mediastinum/Nodes: Mediastinum is deviated into the left hemidiaphragm. Imaged thyroid  gland without nodules meeting criteria for imaging follow-up by size. Normal esophagus. No pathologically enlarged axillary, supraclavicular, mediastinal, or hilar lymph nodes. Lungs/Pleura: ETT terminates 4.3 cm above the carina. The central airways are patent. Complete right lower lobe atelectasis. Subsegmental atelectasis of the right upper and middle lobes. Irregular consolidation in the left lower lobe. No focal consolidation. Trace right pneumothorax. Large right pleural effusion demonstrates increased attenuation. Upper abdomen: Enteric tube terminates along the lateral wall of the gastric fundus. Partially imaged multifocal bilateral renal hypodensities, many of which are  subcentimeter. Musculoskeletal: No acute or abnormal lytic or blastic osseous lesions. Multilevel degenerative changes of the partially imaged thoracic and lumbar spine. Subcutaneous soft tissue stranding in the right upper chest. IMPRESSION: 1. Large right pleural effusion demonstrates increased attenuation, suspicious for hemothorax. Trace right pneumothorax. 2. Complete right lower lobe atelectasis. Subsegmental atelectasis of the right upper and middle lobes. 3. Irregular consolidation in the left lower lobe, likely atelectasis. Superimposed aspiration or pneumonia is not  excluded. 4. Linear hyperdensity within the right brachiocephalic vein, which may be postprocedural. Multiple linear densities extending from the posterior aspect of the lower right brachiocephalic vein, which may reflect postprocedural changes. 5. Multichamber cardiomegaly. 6. Subcutaneous soft tissue stranding in the right upper chest, likely postprocedural. Aortic Atherosclerosis (ICD10-I70.0). Coronary artery calcifications. Assessment for potential risk factor modification, dietary therapy or pharmacologic therapy may be warranted, if clinically indicated. Electronically Signed   By: Limin  Xu M.D.   On: 08/05/2023 19:32   CT HEAD WO CONTRAST ( ) Result Date: 08/05/2023 CLINICAL DATA:  Mental status change, unknown cause. EXAM: CT HEAD WITHOUT CONTRAST TECHNIQUE: Contiguous axial images were obtained from the base of the skull through the vertex without intravenous contrast. RADIATION DOSE REDUCTION: This exam was performed according to the departmental dose-optimization program which includes automated exposure control, adjustment of the mA and/or kV according to patient size and/or use of iterative reconstruction technique. COMPARISON:  Head CT and MRI 03/10/2022 FINDINGS: Brain: There is no evidence of an acute infarct, intracranial hemorrhage, mass, midline shift, or extra-axial fluid collection. A chronic infarct or other remote insult is again noted in the right cerebellar hemisphere. Mild calcification is noted in the cerebellar vermis and left cerebellar hemisphere. There are chronic lacunar infarcts in the left basal ganglia, and there are unchanged small chronic left frontal, parietal, and occipital cortical infarcts. No age advanced generalized cerebral atrophy is evident. The ventricles are normal in size. There is a background of mild chronic small vessel ischemia in the cerebral white matter. Vascular: Calcified atherosclerosis at the skull base. No hyperdense vessel. Skull: No acute fracture  or suspicious lesion. Sinuses/Orbits: Visualized paranasal sinuses and mastoid air cells are clear. Unremarkable orbits. Other: None. IMPRESSION: 1. No evidence of acute intracranial abnormality. 2. Chronic ischemia as above. Electronically Signed   By: Aundra Lee M.D.   On: 08/05/2023 18:25   DG Abd 1 View Result Date: 08/05/2023 CLINICAL DATA:  Orogastric tube placement. EXAM: ABDOMEN - 1 VIEW COMPARISON:  Portable chest radiograph obtained earlier today. FINDINGS: Interval orogastric tube with its tip in the proximal to mid stomach. The endotracheal tube remains in satisfactory position. Stable diffuse right lung opacity and clear left lung. The included portion of the bowel is unremarkable. Extensive thoracic spine degenerative changes with changes of DISH. IMPRESSION: 1. Satisfactory position of the orogastric tube. 2. Stable large right pleural effusion. Electronically Signed   By: Catherin Closs M.D.   On: 08/05/2023 16:35   DG Chest Port 1 View Result Date: 08/05/2023 CLINICAL DATA:  Status post intubation EXAM: PORTABLE CHEST 1 VIEW COMPARISON:  Chest radiograph dated 04/13/2021 FINDINGS: Lines/tubes: Endotracheal tube tip projects 5.6 cm above the carina. Lungs: New opacification of the right lung. Linear left retrocardiac opacities. Pleura: Large right pleural effusion.  No pneumothorax. Heart/mediastinum: Left upper porta is obscured. Bones: No acute osseous abnormality. IMPRESSION: 1. Endotracheal tube tip projects 5.6 cm above the carina. 2. New opacification of the right lung, likely a combination of atelectasis and large pleural effusion. 3. Linear  left retrocardiac opacities, likely atelectasis. Electronically Signed   By: Limin  Xu M.D.   On: 08/05/2023 14:46   DG C-Arm 1-60 Min-No Report Result Date: 08/05/2023 Fluoroscopy was utilized by the requesting physician.  No radiographic interpretation.   DG C-Arm 1-60 Min-No Report Result Date: 08/05/2023 Fluoroscopy was utilized by the  requesting physician.  No radiographic interpretation.   DG C-Arm 1-60 Min-No Report Result Date: 08/05/2023 Fluoroscopy was utilized by the requesting physician.  No radiographic interpretation.     Medications:    epinephrine  1 mcg/min (08/07/23 0748)   norepinephrine (LEVOPHED) Adult infusion Stopped (08/06/23 1610)   piperacillin -tazobactam (ZOSYN )  IV Stopped (08/07/23 9604)   propofol  (DIPRIVAN ) infusion 15 mcg/kg/min (08/07/23 0748)    amiodarone   200 mg Oral BID   Chlorhexidine  Gluconate Cloth  6 each Topical Q0600   docusate  100 mg Per Tube BID   hydrocerin   Topical Daily   insulin  aspart  0-9 Units Subcutaneous Q4H   iron  polysaccharides  150 mg Oral Daily   methylPREDNISolone  (SOLU-MEDROL ) injection  40 mg Intravenous Q24H   mouth rinse  15 mL Mouth Rinse Q2H   pantoprazole (PROTONIX) IV  40 mg Intravenous Daily   polyethylene glycol  17 g Per Tube Daily   rosuvastatin   40 mg Oral Daily   alteplase , alum & mag hydroxide-simeth, fentaNYL  (SUBLIMAZE ) injection, fentaNYL  (SUBLIMAZE ) injection, guaiFENesin -dextromethorphan, heparin , hydrALAZINE , HYDROmorphone  (DILAUDID ) injection, labetalol, metoprolol tartrate, ondansetron , mouth rinse, oxyCODONE -acetaminophen , phenol, potassium chloride  Assessment/ Plan:  Mr. Kareen Hitsman is a 63 y.o.  male with end stage renal disease on hemodialysis, diabetes, hypertension, CAD, Rt AKA, anemia, CHF who was admitted to [facility] on 08/05/2023 for revision of AVF. Patient had anaphylactic reaction and admitted to ICU.   [facility]/MWF/left AVF  End-stage renal disease with hyperkalemia on hemodialysis.   - Dialysis for today.  - resume MWF schedule after today's treatment.   2. Anemia of chronic kidney disease Lab Results  Component Value Date   HGB 8.4 (L) 08/07/2023    ESA with Monday treatment.   3. Secondary Hyperparathyroidism: with outpatient labs: PTH 410, phosphorus 4.7, calcium  8.8 on 07/19/23.    Lab Results   Component Value Date   CALCIUM  8.4 (L) 08/07/2023   CAION 1.06 (L) 08/05/2023   PHOS 3.9 08/07/2023    Bone minerals within optimal range.  Will continue to monitor during this admission.  4. Acute respiratory failure: intubated and requiring mechanical ventilation. With hydrothorax stats post chest tube placement.   - Appreciate critical care input.    LOS: 2 Lemario Chaikin 5/24/202510:47 AM

## 2023-08-07 NOTE — Progress Notes (Signed)
 NAME:  Adam Howell, MRN:  [id number], DOB:  1961/03/11, LOS: 2 ADMISSION DATE:  08/05/2023, CONSULTATION DATE:  08/05/2023 REFERRING MD:  Dr. Vonna Guardian, CHIEF COMPLAINT:  Shock, post-op vent managment   Brief Pt Description / Synopsis:  63 year old male with past medical history significant for ESRD on HD, HFrEF, hypertension, hyperlipidemia presenting for elective AV fistula revision.  PermCath placement complicated by bleeding, concern for potential anaphylactic reaction to IV contrast, and shock.  Returns to ICU postprocedure and remains intubated.  History of Present Illness:  Adam Howell is a 63 y.o male with past medical history significant for ESRD on HD, HFrEF, HTN, HLD who presented to [facility] on 08/05/23 for elective revision of his AV fistula as is was noted to be quite aneurysmal with scabs and impending skin threat putting it at high risk for bleeding.  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  The patient became hypotensive with SBP in the 60's concerning for bleeding in the diseased central venous system verses possible IV contrast reaction.  Fluroscopy did show some blood in the pleural cavity concerning for hemothorax.     Femoral temporary Dialysis catheter was placed along with femoral central line placement. Consideration was given for placing a chest tube, but it was felt this may result in increased bleeding as the pressure from the existing hematoma would help tamponade any further venous bleeding and if we remove this into the cavity further bleeding would likely ensue. Also of note, the patient had been on Eliquis  up till yesterday and had received heparin  during the revision of his fistula.     08/06/23- patient on VRE contact precautions. Hemothorax noted on right chest.  Will give FFP and chest tube placement today.  There is some mediastinal shift and possible tension ,  patient requiring vasopressor.  I discussed with vascular surgeon Dr Vonna Guardian and daughter POA of patient.  We plan to place chest tube on right for hemothorax.   08/07/23- no overnight events, patient is off of vasopressors this am. 80cc serosang return from chest tube overnight but minimal return during dayshift today.  Daughter coming to see patient today.   Pertinent  Medical History   Past Medical History:  Diagnosis Date   Anemia in chronic kidney disease    Cellulitis of left lower extremity    Chronic atrial fibrillation (HCC)    Chronic kidney disease    Congestive heart failure (HCC)    Difficult intubation    End-stage renal disease on hemodialysis (HCC)    Hyperlipidemia    Hypertension    Type 2 diabetes mellitus with complication (HCC)     Micro Data:  5/22: MRSA PCR>>negative  Antimicrobials:   Anti-infectives (From admission, onward)    Start     Dose/Rate Route Frequency Ordered Stop   08/06/23 0900  piperacillin -tazobactam (ZOSYN ) IVPB 2.25 g        2.25 g 100 mL/hr over 30 Minutes Intravenous Every 8 hours 08/06/23 0812     08/05/23 0600  ceFAZolin  (ANCEF ) IVPB 2g/100 mL premix        2 g 200 mL/hr over 30 Minutes Intravenous On call to O.R. 08/04/23 2248 08/05/23 1143       Significant Hospital Events: Including procedures, antibiotic start and stop dates in addition to other pertinent events   5/22: Presented for elective AV fistula revision.  During Permcath placement, concern for bleeding  and hemothorax.  Also with concern for anaphylaxis with anaphylactic shock following IV contrast requiring Epi gtt.  Returns to ICU post procedure and remains intubated.  PCCM consulted.  Nephrology consulted for HD.  Interim History / Subjective:  As outlined above under "Significant Hospital Events" section  Objective    Blood pressure 99/60, pulse 66, temperature 97.7 F (36.5 C), temperature source Axillary, resp. rate 16, height 6' (1.829 m), weight 102.1 kg,  SpO2 98%.    Vent Mode: PRVC FiO2 (%):  [35 %] 35 % Set Rate:  [16 bmp] 16 bmp Vt Set:  [550 mL] 550 mL PEEP:  [8 cmH20] 8 cmH20 Plateau Pressure:  [20 cmH20-23 cmH20] 20 cmH20   Intake/Output Summary (Last 24 hours) at 08/07/2023 1007 Last data filed at 08/07/2023 0749 Gross per 24 hour  Intake 1537.12 ml  Output 2535 ml  Net -997.88 ml   Filed Weights   08/05/23 0627 08/05/23 1930 08/05/23 2310  Weight: 102.1 kg 102.1 kg 102.1 kg    Examination: General: Critically ill-appearing male, laying in bed, intubated and sedated, no acute distress HENT: Atraumatic, normocephalic, neck supple, difficult to assess JVD due to body habitus Lungs: Mechanical breath sounds throughout, even, nonlabored, synchronous with the vent Cardiovascular: Irregular irregular rhythm, rate controlled, no murmurs, rubs, gallops Abdomen: Obese, soft, nontender, nondistended, no guarding or rebound tenderness, bowel sounds positive x 4 Extremities: Right BKA, chronic wounds to left shin (see images below) Neuro: Sedated, currently does not withdraw from pain or follow commands, pupils PERRLA GU: Deferred           Resolved Hospital Problem list     Assessment & Plan:   #1. Ciculatory Shock: - RESOLVED           HEMOTHORAX - S/P CHEST TUBE ON RIGHT - minimal return today , dark blood yesterday.  Clamping chest tube today, will attempt to remove tomorrow and extubate if possible.   #Chronic Atrial Fibrillation #Chronic HFrEF PMHx: CHF,HTN, HLD Echocardiogram 01/10/20: LVEF 35-40%, unable to evaluate diastolic function, RV systolic function low normal, mildly elevated pulmonary artery systolic pressure -Continuous cardiac monitoring   #Suspected Anaphylactic Reaction due to IV contrast -Epi gtt -IV Steroids -IV Benadryl  -Avoid further IV contrast  #Post-Op Ventilator Management PRVC FiO2 -35% Sedation needs in setting of mechanical ventilation -Maintain a RASS goal of 0 to  -1 -Fentanyl  and Propofol  as needed to maintain RASS goal -Avoid sedating medications as able -Daily wake up assessment  #ESRD on HD #Hyperkalemia #Mild Hyponatremia #AV Fistula revision -Monitor I&O's / urinary output -Follow BMP -Ensure adequate renal perfusion -Avoid nephrotoxic agents as able -Replace electrolytes as indicated ~ Pharmacy following for assistance with electrolyte replacement -Nephrology consulted, appreciate input ~ HD as per Nephrology  #Acute Blood Loss Anemia due to Hemothorax -Monitor for S/Sx of bleeding -Trend CBC -SCD's for VTE Prophylaxis (hold DVT ppx/home Eliquis  for now) -Transfuse for Hgb <7  #Diabetes Mellitus -CBG's q4h; Target range of 140 to 180 -SSI -Follow ICU Hypo/Hyperglycemia protocol       Best Practice (right click and "Reselect all SmartList Selections" daily)   Diet/type: NPO DVT prophylaxis: SCD GI prophylaxis: PPI Lines: Central line, Dialysis Catheter, and yes and it is still needed Foley:  N/A Code Status:  full code Last date of multidisciplinary goals of care discussion [N/A]   Labs   CBC: Recent Labs  Lab 08/05/23 0654 08/05/23 1305 08/05/23 2143 08/06/23 0557 08/06/23 1451 08/06/23 2104 08/07/23 0348  WBC 3.8* 15.0*  13.5* 14.5*  --  14.0* 14.6*  NEUTROABS 1.7 12.6*  --   --   --   --   --   HGB 14.5  16.3 11.9* 11.3* 10.6* 8.8* 8.4* 8.4*  HCT 45.4  48.0 37.1* 35.4* 31.8* 26.7* 24.7* 24.8*  MCV 88.2 88.5 87.6 85.9  --  84.3 83.8  PLT 108* 160 151 152  --  141* 141*    Basic Metabolic Panel: Recent Labs  Lab 08/05/23 0654 08/05/23 1305 08/06/23 0549 08/07/23 0348  NA 135  136 133* 132* 132*  K 4.2  4.2 5.5* 5.5* 4.7  CL 93*  99 95* 95* 94*  CO2 30 26 25 25   GLUCOSE 102*  102* 243* 174* 154*  BUN 32*  31* 34* 29* 46*  CREATININE 8.47*  8.40* 8.72* 6.85* 8.41*  CALCIUM  9.1 8.1* 8.8* 8.4*  PHOS  --   --  3.0 3.9   GFR: Estimated Creatinine Clearance: 11.3 mL/min (A) (by C-G formula  based on SCr of 8.41 mg/dL (H)). Recent Labs  Lab 08/05/23 1425 08/05/23 2143 08/06/23 0557 08/06/23 1056 08/06/23 1433 08/06/23 2104 08/07/23 0348  WBC  --  13.5* 14.5*  --   --  14.0* 14.6*  LATICACIDVEN 2.5*  --   --  3.3* 5.2* 2.9*  --     Liver Function Tests: Recent Labs  Lab 08/05/23 1305 08/06/23 0549 08/07/23 0348  AST 17  --   --   ALT 17  --   --   ALKPHOS 84  --   --   BILITOT 0.8  --   --   PROT 6.5  --   --   ALBUMIN  3.2* 3.4* 2.8*   No results for input(s): "LIPASE", "AMYLASE" in the last 168 hours. No results for input(s): "AMMONIA" in the last 168 hours.  ABG    Component Value Date/Time   PHART 7.43 08/05/2023 1313   PCO2ART 40 08/05/2023 1313   PO2ART 82 (L) 08/05/2023 1313   HCO3 26.5 08/05/2023 1313   TCO2 30 08/05/2023 0654   O2SAT 98.3 08/05/2023 1313     Coagulation Profile: Recent Labs  Lab 08/05/23 1305  INR 1.3*    Cardiac Enzymes: No results for input(s): "CKTOTAL", "CKMB", "CKMBINDEX", "TROPONINI" in the last 168 hours.  HbA1C: HB A1C (BAYER DCA - WAIVED)  Date/Time Value Ref Range Status  01/12/2023 02:13 PM 5.9 (H) 4.8 - 5.6 % Final    Comment:             Prediabetes: 5.7 - 6.4          Diabetes: >6.4          Glycemic control for adults with diabetes: <7.0   10/12/2022 02:20 PM 5.9 (H) 4.8 - 5.6 % Final    Comment:             Prediabetes: 5.7 - 6.4          Diabetes: >6.4          Glycemic control for adults with diabetes: <7.0    Hgb A1c MFr Bld  Date/Time Value Ref Range Status  08/06/2023 05:49 AM 6.0 (H) 4.8 - 5.6 % Final    Comment:    (NOTE) Pre diabetes:          5.7%-6.4%  Diabetes:              >6.4%  Glycemic control for   <7.0% adults with diabetes   02/16/2023 03:54 PM 6.4 (  H) 4.8 - 5.6 % Final    Comment:    (NOTE) Pre diabetes:          5.7%-6.4%  Diabetes:              >6.4%  Glycemic control for   <7.0% adults with diabetes     CBG: Recent Labs  Lab 08/06/23 1540  08/06/23 1949 08/06/23 2322 08/07/23 0351 08/07/23 0734  GLUCAP 165* 175* 169* 153* 135*    Review of Systems:   Unable to assess due to intubation/sedation/critical illness   Past Medical History:  He,  has a past medical history of Anemia in chronic kidney disease, Cellulitis of left lower extremity, Chronic atrial fibrillation (HCC), Chronic kidney disease, Congestive heart failure (HCC), Difficult intubation, End-stage renal disease on hemodialysis (HCC), Hyperlipidemia, Hypertension, and Type 2 diabetes mellitus with complication (HCC).   Surgical History:   Past Surgical History:  Procedure Laterality Date   A/V FISTULAGRAM Left 08/27/2022   Procedure: A/V Fistulagram;  Surgeon: Celso College, MD;  Location: [facility];  Service: Cardiovascular;  Laterality: Left;   AMPUTATION Right 03/13/2022   Procedure: AMPUTATION ABOVE KNEE;  Surgeon: Celso College, MD;  Location: [facility];  Service: General;  Laterality: Right;   APPLICATION OF WOUND VAC Right 03/07/2022   Procedure: APPLICATION OF WOUND VAC;  Surgeon: Lesta Rater, MD;  Location: [facility];  Service: Vascular;  Laterality: Right;  GAAC [id number]   CENTRAL LINE INSERTION Left 08/05/2023   Procedure: CENTRAL LINE INSERTION;  Surgeon: Celso College, MD;  Location: [facility];  Service: Vascular;  Laterality: Left;   DIALYSIS/PERMA CATHETER INSERTION Right 08/05/2023   Procedure: DIALYSIS/PERMA CATHETER INSERTION;  Surgeon: Celso College, MD;  Location: [facility];  Service: Vascular;  Laterality: Right;   GROIN DISSECTION Right 08/05/2023   Procedure: right groin venogram, angioplasty of superior venacava and anomic vein;  Surgeon: Celso College, MD;  Location: [facility];  Service: Vascular;  Laterality: Right;   LOWER EXTREMITY ANGIOGRAPHY Left 02/15/2023   Procedure: Lower Extremity Angiography;  Surgeon: Celso College, MD;  Location: [facility];  Service: Cardiovascular;  Laterality: Left;   REVISON OF ARTERIOVENOUS  FISTULA Left 08/05/2023   Procedure: REVISON OF ARTERIOVENOUS FISTULA;  Surgeon: Celso College, MD;  Location: [facility];  Service: Vascular;  Laterality: Left;  JUMP GRAFT   WOUND DEBRIDEMENT Left    WOUND DEBRIDEMENT Right 01/16/2022   Procedure: DEBRIDEMENT WOUND;  Surgeon: Jackquelyn Mass, MD;  Location: [facility];  Service: Vascular;  Laterality: Right;  Wound vac placement   WOUND DEBRIDEMENT Right 03/07/2022   Procedure: DEBRIDEMENT WOUND RIGHT LOWER EXTREMITY;  Surgeon: Lesta Rater, MD;  Location: [facility];  Service: Vascular;  Laterality: Right;     Social History:   reports that he has never smoked. He has never used smokeless tobacco. He reports that he does not currently use alcohol. He reports that he does not use drugs.   Family History:  His family history includes Cirrhosis in his father; Heart failure in his mother; Hypertension in his daughter.   Allergies Allergies  Allergen Reactions   Ivp Dye [Iodinated Contrast Media] Anaphylaxis   Tramadol  Rash     Home Medications  Prior to Admission medications   Medication Sig Start Date End Date Taking? Authorizing Provider  amiodarone  (PACERONE ) 200 MG tablet Take 1 tablet (200 mg total) by mouth 2 (two) times daily. 10/12/22  Yes Cannady, Jolene T,  NP  aspirin  EC 81 MG tablet Take 1 tablet (81 mg total) by mouth daily. Swallow whole. 03/05/23  Yes Althia Atlas, MD  carvedilol  (COREG ) 6.25 MG tablet Take 6.25 mg by mouth daily. 06/25/23  Yes [provider]  ELIQUIS  2.5 MG TABS tablet Take 1 tablet (2.5 mg total) by mouth 2 (two) times daily. 10/12/22  Yes Cannady, Jolene T, NP  iron  polysaccharides (NIFEREX) 150 MG capsule Take 1 capsule (150 mg total) by mouth daily. 03/05/23  Yes Althia Atlas, MD  melatonin 3 MG TABS tablet Take 3 mg by mouth at bedtime.   Yes [provider]  midodrine (PROAMATINE) 5 MG tablet Take 5 mg by mouth as needed (post dialysis hypotension SBP<90 or DBP<55).   Yes [provider]  rosuvastatin  (CRESTOR ) 40 MG tablet Take 1 tablet (40 mg total) by mouth daily. 10/12/22  Yes Cannady, Jolene T, NP  SANTYL  250 UNIT/GM ointment Apply 1 Application topically daily. 02/08/23  Yes [provider]  senna (SENOKOT) 8.6 MG TABS tablet Take 2 tablets (17.2 mg total) by mouth at bedtime. 04/03/22  Yes Love, Renay Carota, PA-C  sevelamer  carbonate (RENVELA ) 800 MG tablet Take 2,400 mg by mouth 3 (three) times daily. 09/21/22  Yes [provider]  traZODone  (DESYREL ) 100 MG tablet Take 1 tablet (100 mg total) by mouth at bedtime. 01/12/23  Yes Cannady, Jolene T, NP  acetaminophen  (TYLENOL ) 325 MG tablet Take 2 tablets (650 mg total) by mouth every 6 (six) hours as needed for mild pain (pain score 1-3), fever or headache (or Fever >/= 101). 03/04/23   Althia Atlas, MD  oxyCODONE -acetaminophen  (PERCOCET) 10-325 MG tablet Take 1 tablet by mouth every 6 (six) hours as needed for pain. 08/05/23   Celso College, MD     Critical care provider statement:   Total critical care time: 63 minutes   Performed by: Jaclynn Mast MD   Critical care time was exclusive of separately billable procedures and treating other patients.   Critical care was necessary to treat or prevent imminent or life-threatening deterioration.   Critical care was time spent personally by me on the following activities: development of treatment plan with patient and/or surrogate as well as nursing, discussions with consultants, evaluation of patient's response to treatment, examination of patient, obtaining history from patient or surrogate, ordering and performing treatments and interventions, ordering and review of laboratory studies, ordering and review of radiographic studies, pulse oximetry and re-evaluation of patient's condition.    Fawzi Melman, M.D.  Pulmonary & Critical Care Medicine

## 2023-08-07 NOTE — Progress Notes (Signed)
 [facility]  Daily Progress Note   Subjective  -   Intubated and sedated  Objective Vitals:   08/07/23 0700 08/07/23 0715 08/07/23 0730 08/07/23 0745  BP: 113/62 121/69 (!) 90/56 99/60  Pulse: 64 63 65 66  Resp: 16 16 16 16   Temp:    97.7 F (36.5 C)  TempSrc:    Axillary  SpO2: 96% 96% 98% 98%  Weight:      Height:        Intake/Output Summary (Last 24 hours) at 08/07/2023 1203 Last data filed at 08/07/2023 0749 Gross per 24 hour  Intake 1439.4 ml  Output 2535 ml  Net -1095.6 ml    PULM  CTAB CV  RRR VASC  Right AKA, healed.  Left leg with calf and foot compression wrap.  Right femoral HD catheter and Left femoral triple lumen with no apparent issues.  Right neck OK.  Left forearm AVF is occluded (no pulse, bruit or auscutated flow.  Laboratory CBC    Component Value Date/Time   WBC 14.6 (H) 08/07/2023 0348   HGB 8.4 (L) 08/07/2023 0348   HGB 16.8 01/12/2023 1414   HCT 24.8 (L) 08/07/2023 0348   HCT 51.0 01/12/2023 1414   PLT 141 (L) 08/07/2023 0348   PLT 257 01/12/2023 1414    BMET    Component Value Date/Time   NA 132 (L) 08/07/2023 0348   NA 136 04/14/2023 1610   K 4.7 08/07/2023 0348   CL 94 (L) 08/07/2023 0348   CO2 25 08/07/2023 0348   GLUCOSE 154 (H) 08/07/2023 0348   BUN 46 (H) 08/07/2023 0348   BUN 18 04/14/2023 1610   CREATININE 8.41 (H) 08/07/2023 0348   CALCIUM  8.4 (L) 08/07/2023 0348   GFRNONAA 7 (L) 08/07/2023 0348    Assessment/Planning: POD #2 s/p innominate vein perforation.  Chest tube with no significant output and now serous.  Hgb is stable as are hemodynamics off pressors.  Vascular will follow.  Appreciate ICU care.  Jerri Morale  08/07/2023, 12:03 PM

## 2023-08-07 NOTE — Plan of Care (Signed)
  Problem: Activity: Goal: Ability to tolerate increased activity will improve Outcome: Progressing   Problem: Respiratory: Goal: Ability to maintain a clear airway and adequate ventilation will improve Outcome: Progressing   Problem: Role Relationship: Goal: Method of communication will improve Outcome: Progressing   Problem: Education: Goal: Knowledge of General Education information will improve Description: Including pain rating scale, medication(s)/side effects and non-pharmacologic comfort measures Outcome: Progressing   Problem: Health Behavior/Discharge Planning: Goal: Ability to manage health-related needs will improve Outcome: Progressing   Problem: Activity: Goal: Risk for activity intolerance will decrease Outcome: Progressing   Problem: Nutrition: Goal: Adequate nutrition will be maintained Outcome: Progressing   Problem: Coping: Goal: Level of anxiety will decrease Outcome: Progressing   Problem: Elimination: Goal: Will not experience complications related to bowel motility Outcome: Progressing Goal: Will not experience complications related to urinary retention Outcome: Progressing   Problem: Pain Managment: Goal: General experience of comfort will improve and/or be controlled Outcome: Progressing   Problem: Safety: Goal: Ability to remain free from injury will improve Outcome: Progressing   Problem: Skin Integrity: Goal: Risk for impaired skin integrity will decrease Outcome: Progressing   Problem: Education: Goal: Ability to describe self-care measures that may prevent or decrease complications (Diabetes Survival Skills Education) will improve Outcome: Progressing Goal: Individualized Educational Video(s) Outcome: Progressing   Problem: Coping: Goal: Ability to adjust to condition or change in health will improve Outcome: Progressing   Problem: Fluid Volume: Goal: Ability to maintain a balanced intake and output will improve Outcome:  Progressing   Problem: Health Behavior/Discharge Planning: Goal: Ability to identify and utilize available resources and services will improve Outcome: Progressing Goal: Ability to manage health-related needs will improve Outcome: Progressing   Problem: Metabolic: Goal: Ability to maintain appropriate glucose levels will improve Outcome: Progressing   Problem: Nutritional: Goal: Maintenance of adequate nutrition will improve Outcome: Progressing Goal: Progress toward achieving an optimal weight will improve Outcome: Progressing   Problem: Skin Integrity: Goal: Risk for impaired skin integrity will decrease Outcome: Progressing   Problem: Tissue Perfusion: Goal: Adequacy of tissue perfusion will improve Outcome: Progressing

## 2023-08-07 NOTE — Progress Notes (Signed)
 Updated pts daughter [name] at bedside regarding pts condition and current plan of care.  All questions answered will continue to monitor and assess pt.  Janey Meek, AGNP  Pulmonary/Critical Care Pager [phone] (please enter 7 digits) PCCM Consult Pager [phone] (please enter 7 digits)

## 2023-08-07 NOTE — Plan of Care (Signed)
 Problem: Activity: Goal: Ability to tolerate increased activity will improve 08/07/2023 1927 by Jeneen Mire, Billie Budge, RN Outcome: Progressing 08/07/2023 1926 by Jeneen Mire, Billie Budge, RN Outcome: Progressing   Problem: Respiratory: Goal: Ability to maintain a clear airway and adequate ventilation will improve 08/07/2023 1927 by Jeneen Mire, Billie Budge, RN Outcome: Progressing 08/07/2023 1926 by Jeneen Mire, Billie Budge, RN Outcome: Progressing   Problem: Role Relationship: Goal: Method of communication will improve 08/07/2023 1927 by Nereida Banning, RN Outcome: Progressing 08/07/2023 1926 by Jeneen Mire, Billie Budge, RN Outcome: Progressing   Problem: Education: Goal: Knowledge of General Education information will improve Description: Including pain rating scale, medication(s)/side effects and non-pharmacologic comfort measures 08/07/2023 1927 by Nereida Banning, RN Outcome: Progressing 08/07/2023 1926 by Jeneen Mire, Billie Budge, RN Outcome: Progressing   Problem: Health Behavior/Discharge Planning: Goal: Ability to manage health-related needs will improve 08/07/2023 1927 by Nereida Banning, RN Outcome: Progressing 08/07/2023 1926 by Jeneen Mire, Billie Budge, RN Outcome: Progressing   Problem: Clinical Measurements: Goal: Ability to maintain clinical measurements within normal limits will improve 08/07/2023 1927 by Nereida Banning, RN Outcome: Progressing 08/07/2023 1926 by Jeneen Mire, Billie Budge, RN Outcome: Progressing Goal: Will remain free from infection 08/07/2023 1927 by Nereida Banning, RN Outcome: Progressing 08/07/2023 1926 by Jeneen Mire, Billie Budge, RN Outcome: Progressing Goal: Diagnostic test results will improve 08/07/2023 1927 by Nereida Banning, RN Outcome: Progressing 08/07/2023 1926 by Jeneen Mire, Billie Budge, RN Outcome: Progressing Goal: Respiratory complications will improve 08/07/2023 1927 by Nereida Banning, RN Outcome: Progressing 08/07/2023 1926 by Jeneen Mire, Billie Budge, RN Outcome: Progressing Goal: Cardiovascular complication will be avoided 08/07/2023 1927 by Nereida Banning, RN Outcome: Progressing 08/07/2023 1926 by Nereida Banning, RN Outcome: Progressing   Problem: Activity: Goal: Risk for activity intolerance will decrease 08/07/2023 1927 by Jeneen Mire, Billie Budge, RN Outcome: Progressing 08/07/2023 1926 by Jeneen Mire, Billie Budge, RN Outcome: Progressing   Problem: Nutrition: Goal: Adequate nutrition will be maintained 08/07/2023 1927 by Jeneen Mire, Billie Budge, RN Outcome: Progressing 08/07/2023 1926 by Jeneen Mire, Billie Budge, RN Outcome: Progressing   Problem: Coping: Goal: Level of anxiety will decrease 08/07/2023 1927 by Jeneen Mire, Billie Budge, RN Outcome: Progressing 08/07/2023 1926 by Jeneen Mire, Billie Budge, RN Outcome: Progressing   Problem: Elimination: Goal: Will not experience complications related to bowel motility 08/07/2023 1927 by Nereida Banning, RN Outcome: Progressing 08/07/2023 1926 by Jeneen Mire, Billie Budge, RN Outcome: Progressing Goal: Will not experience complications related to urinary retention 08/07/2023 1927 by Nereida Banning, RN Outcome: Progressing 08/07/2023 1926 by Jeneen Mire, Billie Budge, RN Outcome: Progressing   Problem: Pain Managment: Goal: General experience of comfort will improve and/or be controlled 08/07/2023 1927 by Jeneen Mire, Billie Budge, RN Outcome: Progressing 08/07/2023 1926 by Jeneen Mire, Billie Budge, RN Outcome: Progressing   Problem: Safety: Goal: Ability to remain free from injury will improve 08/07/2023 1927 by Nereida Banning, RN Outcome: Progressing 08/07/2023 1926 by Jeneen Mire, Billie Budge, RN Outcome: Progressing   Problem: Skin Integrity: Goal: Risk for impaired skin integrity will decrease 08/07/2023 1927 by Nereida Banning, RN Outcome:  Progressing 08/07/2023 1926 by Jeneen Mire, Billie Budge, RN Outcome: Progressing   Problem: Education: Goal: Ability to describe self-care measures that may prevent or decrease complications (Diabetes Survival Skills Education) will improve 08/07/2023 1927 by Nereida Banning, RN Outcome: Progressing 08/07/2023 1926 by Nereida Banning, RN Outcome: Progressing Goal: Individualized Educational Video(s) 08/07/2023 1927 by Jeneen Mire, Billie Budge, RN Outcome: Progressing 08/07/2023 1926 by Nereida Banning, RN Outcome: Progressing   Problem: Coping: Goal:  Ability to adjust to condition or change in health will improve 08/07/2023 1927 by Jeneen Mire, Billie Budge, RN Outcome: Progressing 08/07/2023 1926 by Jeneen Mire, Billie Budge, RN Outcome: Progressing   Problem: Fluid Volume: Goal: Ability to maintain a balanced intake and output will improve 08/07/2023 1927 by Jeneen Mire, Billie Budge, RN Outcome: Progressing 08/07/2023 1926 by Jeneen Mire, Billie Budge, RN Outcome: Progressing   Problem: Health Behavior/Discharge Planning: Goal: Ability to identify and utilize available resources and services will improve 08/07/2023 1927 by Jeneen Mire, Billie Budge, RN Outcome: Progressing 08/07/2023 1926 by Jeneen Mire, Billie Budge, RN Outcome: Progressing Goal: Ability to manage health-related needs will improve 08/07/2023 1927 by Nereida Banning, RN Outcome: Progressing 08/07/2023 1926 by Jeneen Mire, Billie Budge, RN Outcome: Progressing   Problem: Metabolic: Goal: Ability to maintain appropriate glucose levels will improve 08/07/2023 1927 by Nereida Banning, RN Outcome: Progressing 08/07/2023 1926 by Jeneen Mire, Billie Budge, RN Outcome: Progressing   Problem: Nutritional: Goal: Maintenance of adequate nutrition will improve 08/07/2023 1927 by Nereida Banning, RN Outcome: Progressing 08/07/2023 1926 by Jeneen Mire, Billie Budge, RN Outcome: Progressing Goal:  Progress toward achieving an optimal weight will improve 08/07/2023 1927 by Nereida Banning, RN Outcome: Progressing 08/07/2023 1926 by Jeneen Mire, Billie Budge, RN Outcome: Progressing   Problem: Skin Integrity: Goal: Risk for impaired skin integrity will decrease 08/07/2023 1927 by Nereida Banning, RN Outcome: Progressing 08/07/2023 1926 by Jeneen Mire, Billie Budge, RN Outcome: Progressing   Problem: Tissue Perfusion: Goal: Adequacy of tissue perfusion will improve 08/07/2023 1927 by Nereida Banning, RN Outcome: Progressing 08/07/2023 1926 by Nereida Banning, RN Outcome: Progressing

## 2023-08-07 NOTE — Plan of Care (Signed)
  Problem: Role Relationship: Goal: Method of communication will improve Outcome: Progressing   Problem: Activity: Goal: Risk for activity intolerance will decrease Outcome: Progressing   Problem: Coping: Goal: Level of anxiety will decrease Outcome: Progressing   Problem: Pain Managment: Goal: General experience of comfort will improve and/or be controlled Outcome: Progressing

## 2023-08-08 ENCOUNTER — Inpatient Hospital Stay

## 2023-08-08 LAB — RENAL FUNCTION PANEL
Albumin: 2.9 g/dL — ABNORMAL LOW (ref 3.5–5.0)
Anion gap: 13 (ref 5–15)
BUN: 26 mg/dL — ABNORMAL HIGH (ref 8–23)
CO2: 27 mmol/L (ref 22–32)
Calcium: 8.4 mg/dL — ABNORMAL LOW (ref 8.9–10.3)
Chloride: 93 mmol/L — ABNORMAL LOW (ref 98–111)
Creatinine, Ser: 5.26 mg/dL — ABNORMAL HIGH (ref 0.61–1.24)
GFR, Estimated: 12 mL/min — ABNORMAL LOW (ref >60)
Glucose, Bld: 135 mg/dL — ABNORMAL HIGH (ref 70–99)
Phosphorus: 4.4 mg/dL (ref 2.5–4.6)
Potassium: 3.8 mmol/L (ref 3.5–5.1)
Sodium: 133 mmol/L — ABNORMAL LOW (ref 135–145)

## 2023-08-08 LAB — CBC
HCT: 24.7 % — ABNORMAL LOW (ref 39.0–52.0)
Hemoglobin: 8.5 g/dL — ABNORMAL LOW (ref 13.0–17.0)
MCH: 28.7 pg (ref 26.0–34.0)
MCHC: 34.4 g/dL (ref 30.0–36.0)
MCV: 83.4 fL (ref 80.0–100.0)
Platelets: 131 10*3/uL — ABNORMAL LOW (ref 150–400)
RBC: 2.96 MIL/uL — ABNORMAL LOW (ref 4.22–5.81)
RDW: 15.7 % — ABNORMAL HIGH (ref 11.5–15.5)
WBC: 14.7 10*3/uL — ABNORMAL HIGH (ref 4.0–10.5)
nRBC: 0.3 % — ABNORMAL HIGH (ref 0.0–0.2)

## 2023-08-08 LAB — GLUCOSE, CAPILLARY
Glucose-Capillary: 121 mg/dL — ABNORMAL HIGH (ref 70–99)
Glucose-Capillary: 92 mg/dL (ref 70–99)
Glucose-Capillary: 85 mg/dL (ref 70–99)
Glucose-Capillary: 113 mg/dL — ABNORMAL HIGH (ref 70–99)
Glucose-Capillary: 159 mg/dL — ABNORMAL HIGH (ref 70–99)
Glucose-Capillary: 122 mg/dL — ABNORMAL HIGH (ref 70–99)

## 2023-08-08 LAB — HEMOGLOBIN AND HEMATOCRIT, BLOOD
HCT: 23 % — ABNORMAL LOW (ref 39.0–52.0)
Hemoglobin: 7.7 g/dL — ABNORMAL LOW (ref 13.0–17.0)

## 2023-08-08 LAB — MAGNESIUM: Magnesium: 1.9 mg/dL (ref 1.7–2.4)

## 2023-08-08 LAB — HEPATITIS B SURFACE ANTIGEN: Hepatitis B Surface Ag: NONREACTIVE

## 2023-08-08 MED ORDER — MIDODRINE HCL 5 MG PO TABS
5.0000 mg | ORAL_TABLET | Freq: Every day | ORAL | Status: DC | PRN
Start: 2023-08-08 — End: 2023-08-08
  Filled 2023-08-08: qty 1

## 2023-08-08 MED ORDER — LIDOCAINE HCL 1 % IJ SOLN
5.0000 mL | Freq: Once | INTRAMUSCULAR | Status: DC
Start: 2023-08-08 — End: 2023-08-16

## 2023-08-08 MED ORDER — ORAL CARE MOUTH RINSE: 15.0000 mL | OROMUCOSAL | Status: DC | PRN

## 2023-08-08 MED ORDER — MIDODRINE HCL 5 MG PO TABS
10.0000 mg | ORAL_TABLET | Freq: Three times a day (TID) | ORAL | Status: DC
  Administered 2023-08-08 – 2023-08-16 (×23): 10 mg via ORAL
  Filled 2023-08-08 (×23): qty 2

## 2023-08-08 MED ORDER — AMIODARONE HCL 200 MG PO TABS
200.0000 mg | ORAL_TABLET | Freq: Every day | ORAL | Status: DC
  Administered 2023-08-10 – 2023-08-16 (×7): 200 mg via ORAL
  Filled 2023-08-08 (×7): qty 1

## 2023-08-08 MED ORDER — LACTATED RINGERS IV BOLUS
1000.0000 mL | Freq: Once | INTRAVENOUS | Status: AC
  Administered 2023-08-08: 1000 mL via INTRAVENOUS

## 2023-08-08 MED ORDER — MIDODRINE HCL 5 MG PO TABS
5.0000 mg | ORAL_TABLET | Freq: Three times a day (TID) | ORAL | Status: DC
  Administered 2023-08-08: 5 mg via ORAL

## 2023-08-08 MED ORDER — LIDOCAINE HCL 1 % IJ SOLN
INTRAMUSCULAR | Status: DC
Start: 2023-08-08 — End: 2023-08-08
  Filled 2023-08-08: qty 10

## 2023-08-08 MED ORDER — NEPRO/CARBSTEADY PO LIQD
237.0000 mL | Freq: Two times a day (BID) | ORAL | Status: DC
  Administered 2023-08-08: 237 mL via ORAL

## 2023-08-08 NOTE — Progress Notes (Signed)
 [facility]  ROUNDING NOTE   Subjective:   Adam Howell is a 63 y.o male with past medical history of diabetes, hypertension, CAD, Rt AKA, anemia, CHF, and ESRD on dialysis. Patient presents to vascular surgery for revision of AVF. He is being admitted for End stage renal disease (HCC) [N18.6] ESRD (end stage renal disease) (HCC) [N18.6]  Weaned off vasopressors. Extubated.  Hemodialysis treatment overnight. Tolerated treatment well. UF .    Objective:  Vital signs in last 24 hours:  Temp:  [96.6 F (35.9 C)-98.4 F (36.9 C)] 98.2 F (36.8 C) (05/25 0800) Pulse Rate:  [49-78] 70 (05/25 0800) Resp:  [11-21] 16 (05/25 0800) BP: (85-153)/(44-77) 96/63 (05/25 0800) SpO2:  [87 %-100 %] 98 % (05/25 0900) FiO2 (%):  [35 %] 35 % (05/25 0800) Weight:  [108.8 kg-110.9 kg] 108.8 kg (05/25 0054)  Weight change:  Filed Weights   08/05/23 2310 08/07/23 2108 08/08/23 0054  Weight: 102.1 kg 110.9 kg 108.8 kg    Intake/Output: I/O last 3 completed shifts: In: 1713.6 [I.V.:652.2; NG/GT:240; IV Piggyback:721.5] Out: 2305 [Emesis/NG output:450; Other:1700; Chest Tube:155]   Intake/Output this shift:  Total I/O In: 88.8 [I.V.:30.6; IV Piggyback:58.2] Out: -   Physical Exam: General: Critically ill  Head: +ETT  Eyes: Anicteric  Lungs:  PRVC FiO2 35%  Heart: Regular rate and rhythm  Abdomen:  Soft, nontender, obese  Extremities: No peripheral edema  Right AKA  Neurologic: Sedated, intubated  Skin: No lesions  Access: Left aVF (no bruit or thrill) right HD femoral temp cath    Basic Metabolic Panel: Recent Labs  Lab 08/05/23 0654 08/05/23 1305 08/06/23 0549 08/07/23 0348 08/08/23 0307  NA 135  136 133* 132* 132* 133*  K 4.2  4.2 5.5* 5.5* 4.7 3.8  CL 93*  99 95* 95* 94* 93*  CO2 30 26 25 25 27   GLUCOSE 102*  102* 243* 174* 154* 135*  BUN 32*  31* 34* 29* 46* 26*  CREATININE 8.47*  8.40* 8.72* 6.85* 8.41* 5.26*  CALCIUM  9.1 8.1* 8.8* 8.4*  8.4*  MG  --   --   --   --  1.9  PHOS  --   --  3.0 3.9 4.4    Liver Function Tests: Recent Labs  Lab 08/05/23 1305 08/06/23 0549 08/07/23 0348 08/08/23 0307  AST 17  --   --   --   ALT 17  --   --   --   ALKPHOS 84  --   --   --   BILITOT 0.8  --   --   --   PROT 6.5  --   --   --   ALBUMIN  3.2* 3.4* 2.8* 2.9*   No results for input(s): "LIPASE", "AMYLASE" in the last 168 hours. No results for input(s): "AMMONIA" in the last 168 hours.  CBC: Recent Labs  Lab 08/05/23 0654 08/05/23 1305 08/05/23 2143 08/06/23 0557 08/06/23 1451 08/06/23 2104 08/07/23 0348 08/08/23 0307  WBC 3.8* 15.0* 13.5* 14.5*  --  14.0* 14.6* 14.7*  NEUTROABS 1.7 12.6*  --   --   --   --   --   --   HGB 14.5  16.3 11.9* 11.3* 10.6* 8.8* 8.4* 8.4* 8.5*  HCT 45.4  48.0 37.1* 35.4* 31.8* 26.7* 24.7* 24.8* 24.7*  MCV 88.2 88.5 87.6 85.9  --  84.3 83.8 83.4  PLT 108* 160 151 152  --  141* 141* 131*    Cardiac Enzymes: No results  for input(s): "CKTOTAL", "CKMB", "CKMBINDEX", "TROPONINI" in the last 168 hours.  BNP: Invalid input(s): "POCBNP"  CBG: Recent Labs  Lab 08/07/23 1602 08/07/23 1931 08/07/23 2306 08/08/23 0310 08/08/23 0736  GLUCAP 92 120* 164* 121* 92    Microbiology: Results for orders placed or performed during the hospital encounter of 08/05/23  Culture, Respiratory w Gram Stain     Status: None (Preliminary result)   Collection Time: 08/07/23  8:09 AM   Specimen: Tracheal Aspirate; Respiratory  Result Value Ref Range Status   Specimen Description   Final    TRACHEAL ASPIRATE Performed at [facility], [address] [place], [place] [address]    Special Requests   Final    NONE Performed at [facility], [address] [place], [place] [address]    Gram Stain   Final    FEW WBC PRESENT, PREDOMINANTLY MONONUCLEAR FEW GRAM POSITIVE COCCI IN CLUSTERS Performed at [facility], [address], [place], [place] [address]    Culture  PENDING  Incomplete   Report Status PENDING  Incomplete    Coagulation Studies: Recent Labs    08/05/23 1305  LABPROT 16.8*  INR 1.3*    Urinalysis: No results for input(s): "COLORURINE", "LABSPEC", "PHURINE", "GLUCOSEU", "HGBUR", "BILIRUBINUR", "KETONESUR", "PROTEINUR", "UROBILINOGEN", "NITRITE", "LEUKOCYTESUR" in the last 72 hours.  Invalid input(s): "APPERANCEUR"    Imaging: DG Chest Port 1 View Result Date: 08/08/2023 CLINICAL DATA:  Chest tube in place EXAM: PORTABLE CHEST 1 VIEW COMPARISON:  Yesterday FINDINGS: Endotracheal tube with tip at the clavicular heads. An enteric tube at least reaches the stomach. Central line from below in stable position. Right chest tube in place with no visible pneumothorax or hemothorax. Cardiac enlargement. IMPRESSION: Stable hardware positioning and normal aeration. Electronically Signed   By: Ronnette Coke M.D.   On: 08/08/2023 08:56   DG Chest Port 1 View Result Date: 08/07/2023 CLINICAL DATA:  Respiratory failure. EXAM: PORTABLE CHEST 1 VIEW COMPARISON:  08/07/2023, 6:52 a.m. FINDINGS: The heart size and mediastinal contours are within normal limits. Minimal subsegmental atelectasis or scarring at the left base. Normal pulmonary vasculature. No pneumothorax or pleural effusion. Aorta is calcified. The visualized skeletal structures are unremarkable. Endotracheal tube tip at the thoracic inlet. Right-sided chest tube tip in the midline. NG tube tip superimposed with stomach with the side port superimposed with distal esophagus. NG tube should be advanced about 8 cm. IMPRESSION: No active disease. Electronically Signed   By: Sydell Eva M.D.   On: 08/07/2023 22:47   DG Chest 1 View Result Date: 08/07/2023 CLINICAL DATA:  Hemothorax EXAM: CHEST  1 VIEW COMPARISON:  Yesterday FINDINGS: Endotracheal tube with tip at the clavicular heads. An enteric tube at least reaches the diaphragm. Right chest tube. Central line from below with tip at the  lower right atrium. Cardiac enlargement. There is no edema, consolidation, effusion, or pneumothorax. IMPRESSION: No active disease.  Stable hardware positioning. Electronically Signed   By: Ronnette Coke M.D.   On: 08/07/2023 07:16   DG Chest Port 1 View Result Date: 08/06/2023 CLINICAL DATA:  Status post chest tube placement EXAM: PORTABLE CHEST 1 VIEW COMPARISON:  Chest radiograph dated 08/06/2023 FINDINGS: Lines/tubes: Endotracheal tube tip projects 4.8 cm above the carina. Gastric/enteric tube tip projects over the stomach. Inferior approach central venous catheter tip projects over the lower right atrium. Interval placement of medial right pleural catheter. Lungs: Improved right lung aeration. Patchy left retrocardiac opacity. Rounded right hilar opacity. Pleura: Markedly  decreased right pleural effusion. Small pleural effusion remains. No definite pneumothorax. Heart/mediastinum: Enlarged cardiomediastinal silhouette. Bones: No acute osseous abnormality. Trace subcutaneous emphysema overlying the right lateral chest wall. IMPRESSION: 1. Markedly decreased right pleural effusion status post medial right pleural catheter placement. Small pleural effusion remains. No definite pneumothorax. 2. Improved right lung aeration. 3. Rounded right hilar opacity, which may represent a pulmonary mass or lymphadenopathy. Recommend attention on follow-up. 4. Patchy left retrocardiac opacity, which may represent atelectasis or pneumonia. 5. Cardiomegaly. Electronically Signed   By: Limin  Xu M.D.   On: 08/06/2023 16:48     Medications:    norepinephrine (LEVOPHED) Adult infusion Stopped (08/08/23 0342)   piperacillin -tazobactam (ZOSYN )  IV Stopped (08/08/23 0622)    amiodarone   200 mg Oral Daily   Chlorhexidine  Gluconate Cloth  6 each Topical Q0600   docusate  100 mg Per Tube BID   hydrocerin   Topical Daily   insulin  aspart  0-9 Units Subcutaneous Q4H   iron  polysaccharides  150 mg Oral Daily   lidocaine    5 mL Intradermal Once   methylPREDNISolone  (SOLU-MEDROL ) injection  20 mg Intravenous Q24H   mouth rinse  15 mL Mouth Rinse Q2H   polyethylene glycol  17 g Per Tube Daily   rosuvastatin   40 mg Oral Daily   alteplase , alum & mag hydroxide-simeth, guaiFENesin -dextromethorphan, heparin , hydrALAZINE , labetalol, metoprolol tartrate, midodrine, ondansetron , mouth rinse, oxyCODONE -acetaminophen , phenol, potassium chloride  Assessment/ Plan:  [name] is a 63 y.o.  male with end stage renal disease on hemodialysis, diabetes, hypertension, CAD, Rt AKA, anemia, CHF who was admitted to [facility] on 08/05/2023 for revision of AVF. Patient had anaphylactic reaction and admitted to ICU.   [facility]/MWF/left AVF  End-stage renal disease with hyperkalemia on hemodialysis.   - resume MWF schedule   2. Anemia of chronic kidney disease Lab Results  Component Value Date   HGB 8.5 (L) 08/08/2023    ESA with Monday treatment.   3. Secondary Hyperparathyroidism: with outpatient labs: PTH 410, phosphorus 4.7, calcium  8.8 on 07/19/23.    Lab Results  Component Value Date   CALCIUM  8.4 (L) 08/08/2023   CAION 1.06 (L) 08/05/2023   PHOS 4.4 08/08/2023    Bone minerals within optimal range.  Will continue to monitor during this admission.  4. Acute respiratory failure: intubated and requiring mechanical ventilation. With hydrothorax stats post chest tube placement.   - Extubated after rounds.    LOS: 3 Jailene Cupit 5/25/202510:29 AM

## 2023-08-08 NOTE — Procedures (Signed)
   Repair and Closure of Thoracotomy Wound Procedure Note:  Indication: Deep Thoracotomy wound due to large body habitus  Consent: Obtained from patient   Hand washing performed prior to starting the procedure.   Procedure:   An active timeout was performed and correct patient, name, & ID confirmed.    Patient was positioned correctly for surgical wound closure.  Patient was prepped using strict sterile technique including chlorohexadine preps.  The area was prepped, draped and anesthetized in the usual sterile manner. Patient comfort was obtained.    1.5 cm wide wound down to pleural space was repaired and closed post 63F chest tube removal for hemothorax at RIGHT chest wall mid axillary line. Patient body habitus required deep subcutaneous blunt dissection to reach pleural space for chest tube placement.  Patient required surgical closure of thoracotomy wound. I was draped and wore sterile gloves per usual sterile protocol.  Area was sterilized with Chlorhexidine  x 2.  5ml of 1% lidocaine  solution was delivered to subcutanous tissue of wound.    The wound was closed utilizing 3-0 ETHILON MULTIPASS.  2 sutures were placed to close thoracotomy wound.  0.19ml Dermabond advanced topical solution was applied. Dressing with gauze and tegaderm applied over sutured area.  Patient tolerated procedure well with no bleeding and denied pain.  Post procedure CXR was performed and reviewed by me.  Number of Attempts: 1 Complications:none Estimated Blood Loss: none Chest Radiograph reviewed no pneumothorax or effusion.    Chiquitta Matty, M.D.  Pulmonary & Critical Care Medicine  [facility]

## 2023-08-08 NOTE — Progress Notes (Signed)
 eLink Physician-Brief Progress Note Patient Name: Adam Howell DOB: [birth year] MRN: [id number]   Date of Service  08/08/2023  HPI/Events of Note  63 year old male with past medical history significant for ESRD on HD, HFrEF, hypertension, hyperlipidemia presenting for elective AV fistula revision.   For palsy, typically chest tube cannot be clamped.  26 hours-in this situation, no evidence of respiratory distress, no ventilatory dyssynchrony, and radiograph of this morning is still pending.  eICU Interventions  No clear indication to unclamp the chest tube.  Maintain clamped for now despite policy     Intervention Category Intermediate Interventions: Respiratory distress - evaluation and management  Adam Howell 08/08/2023, 2:41 AM

## 2023-08-08 NOTE — Progress Notes (Signed)
 NAME:  Adam Howell, MRN:  [id number], DOB:  04/03/1960, LOS: 3 ADMISSION DATE:  08/05/2023, CONSULTATION DATE:  08/05/2023 REFERRING MD:  Dr. Vonna Guardian, CHIEF COMPLAINT:  Shock, post-op vent managment   Brief Pt Description / Synopsis:  63 year old male with past medical history significant for ESRD on HD, HFrEF, hypertension, hyperlipidemia presenting for elective AV fistula revision.  PermCath placement complicated by bleeding, concern for potential anaphylactic reaction to IV contrast, and shock.  Returns to ICU postprocedure and remains intubated.  History of Present Illness:  Adam Howell is a 64 y.o male with past medical history significant for ESRD on HD, HFrEF, HTN, HLD who presented to [facility] on 08/05/23 for elective revision of his AV fistula as is was noted to be quite aneurysmal with scabs and impending skin threat putting it at high risk for bleeding.  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  The patient became hypotensive with SBP in the 60's concerning for bleeding in the diseased central venous system verses possible IV contrast reaction.  Fluroscopy did show some blood in the pleural cavity concerning for hemothorax.     Femoral temporary Dialysis catheter was placed along with femoral central line placement. Consideration was given for placing a chest tube, but it was felt this may result in increased bleeding as the pressure from the existing hematoma would help tamponade any further venous bleeding and if we remove this into the cavity further bleeding would likely ensue. Also of note, the patient had been on Eliquis  up till yesterday and had received heparin  during the revision of his fistula.     08/06/23- patient on VRE contact precautions. Hemothorax noted on right chest.  Will give FFP and chest tube placement today.  There is some mediastinal shift and possible tension ,  patient requiring vasopressor.  I discussed with vascular surgeon Dr Vonna Guardian and daughter POA of patient.  We plan to place chest tube on right for hemothorax.   08/07/23- no overnight events, patient is off of vasopressors this am. 80cc serosang return from chest tube overnight but minimal return during dayshift today.  Daughter coming to see patient today.   08/08/23- patient liberated of MV this am. Overnight chest tube with no additional drainage post clamping. Removed chest tube.  For SLP.  Pertinent  Medical History   Past Medical History:  Diagnosis Date   Anemia in chronic kidney disease    Cellulitis of left lower extremity    Chronic atrial fibrillation (HCC)    Chronic kidney disease    Congestive heart failure (HCC)    Difficult intubation    End-stage renal disease on hemodialysis (HCC)    Hyperlipidemia    Hypertension    Type 2 diabetes mellitus with complication (HCC)     Micro Data:  5/22: MRSA PCR>>negative  Antimicrobials:   Anti-infectives (From admission, onward)    Start     Dose/Rate Route Frequency Ordered Stop   08/06/23 0900  piperacillin -tazobactam (ZOSYN ) IVPB 2.25 g        2.25 g 100 mL/hr over 30 Minutes Intravenous Every 8 hours 08/06/23 0812     08/05/23 0600  ceFAZolin  (ANCEF ) IVPB 2g/100 mL premix        2 g 200 mL/hr over 30 Minutes Intravenous On call to O.R. 08/04/23 2248 08/05/23 1143       Significant Hospital Events: Including procedures, antibiotic start and stop  dates in addition to other pertinent events   5/22: Presented for elective AV fistula revision.  During Permcath placement, concern for bleeding and hemothorax.  Also with concern for anaphylaxis with anaphylactic shock following IV contrast requiring Epi gtt.  Returns to ICU post procedure and remains intubated.  PCCM consulted.  Nephrology consulted for HD.  Interim History / Subjective:  As outlined above under "Significant Hospital Events" section  Objective    Blood  pressure 96/63, pulse 70, temperature 98.2 F (36.8 C), resp. rate 16, height 6' (1.829 m), weight 108.8 kg, SpO2 98%.    Vent Mode: PSV FiO2 (%):  [35 %] 35 % Set Rate:  [16 bmp] 16 bmp Vt Set:  [550 mL] 550 mL PEEP:  [8 cmH20] 8 cmH20 Plateau Pressure:  [20 cmH20-24 cmH20] 24 cmH20   Intake/Output Summary (Last 24 hours) at 08/08/2023 1018 Last data filed at 08/08/2023 0809 Gross per 24 hour  Intake 751.55 ml  Output 1900 ml  Net -1148.45 ml   Filed Weights   08/05/23 2310 08/07/23 2108 08/08/23 0054  Weight: 102.1 kg 110.9 kg 108.8 kg    Examination: General: Critically ill-appearing male, laying in bed, intubated and sedated, no acute distress HENT: Atraumatic, normocephalic, neck supple, difficult to assess JVD due to body habitus Lungs: Mechanical breath sounds throughout, even, nonlabored, synchronous with the vent Cardiovascular: Irregular irregular rhythm, rate controlled, no murmurs, rubs, gallops Abdomen: Obese, soft, nontender, nondistended, no guarding or rebound tenderness, bowel sounds positive x 4 Extremities: Right BKA, chronic wounds to left shin (see images below) Neuro: Sedated, currently does not withdraw from pain or follow commands, pupils PERRLA GU: Deferred           Resolved Hospital Problem list     Assessment & Plan:   #1. Ciculatory Shock: - RESOLVED           HEMOTHORAX - S/P CHEST TUBE ON RIGHT - minimal return today , dark blood yesterday.  Chest tube removed.   #Chronic Atrial Fibrillation- Continue amio #Chronic HFrEF PMHx: CHF,HTN, HLD Echocardiogram 01/10/20: LVEF 35-40%, unable to evaluate diastolic function, RV systolic function low normal, mildly elevated pulmonary artery systolic pressure -Continuous cardiac monitoring   #Suspected Anaphylactic Reaction due to IV contrast- RESOLVED  -Epi gtt -IV Steroids -IV Benadryl  -Avoid further IV contrast  #Post-Op Ventilator Management- extubated 5/25  #ESRD on  HD #Hyperkalemia #Mild Hyponatremia #AV Fistula revision -Monitor I&O's / urinary output -Follow BMP -Ensure adequate renal perfusion -Avoid nephrotoxic agents as able -Replace electrolytes as indicated ~ Pharmacy following for assistance with electrolyte replacement -Nephrology consulted, appreciate input ~ HD as per Nephrology  #Acute Blood Loss Anemia due to Hemothorax- IMPROVED  -Monitor for S/Sx of bleeding -Trend CBC -SCD's for VTE Prophylaxis (hold DVT ppx/home Eliquis  for now) -Transfuse for Hgb <7  #Diabetes Mellitus -CBG's q4h; Target range of 140 to 180 -SSI -Follow ICU Hypo/Hyperglycemia protocol       Best Practice (right click and "Reselect all SmartList Selections" daily)   Diet/type: NPO DVT prophylaxis: SCD GI prophylaxis: PPI Lines: Central line, Dialysis Catheter, and yes and it is still needed Foley:  N/A Code Status:  full code Last date of multidisciplinary goals of care discussion [N/A]   Labs   CBC: Recent Labs  Lab 08/05/23 0654 08/05/23 1305 08/05/23 2143 08/06/23 0557 08/06/23 1451 08/06/23 2104 08/07/23 0348 08/08/23 0307  WBC 3.8* 15.0* 13.5* 14.5*  --  14.0* 14.6* 14.7*  NEUTROABS 1.7 12.6*  --   --   --   --   --   --  HGB 14.5  16.3 11.9* 11.3* 10.6* 8.8* 8.4* 8.4* 8.5*  HCT 45.4  48.0 37.1* 35.4* 31.8* 26.7* 24.7* 24.8* 24.7*  MCV 88.2 88.5 87.6 85.9  --  84.3 83.8 83.4  PLT 108* 160 151 152  --  141* 141* 131*    Basic Metabolic Panel: Recent Labs  Lab 08/05/23 0654 08/05/23 1305 08/06/23 0549 08/07/23 0348 08/08/23 0307  NA 135  136 133* 132* 132* 133*  K 4.2  4.2 5.5* 5.5* 4.7 3.8  CL 93*  99 95* 95* 94* 93*  CO2 30 26 25 25 27   GLUCOSE 102*  102* 243* 174* 154* 135*  BUN 32*  31* 34* 29* 46* 26*  CREATININE 8.47*  8.40* 8.72* 6.85* 8.41* 5.26*  CALCIUM  9.1 8.1* 8.8* 8.4* 8.4*  MG  --   --   --   --  1.9  PHOS  --   --  3.0 3.9 4.4   GFR: Estimated Creatinine Clearance: 18.6 mL/min (A) (by C-G  formula based on SCr of 5.26 mg/dL (H)). Recent Labs  Lab 08/06/23 0557 08/06/23 1056 08/06/23 1433 08/06/23 2104 08/07/23 0348 08/07/23 2110 08/08/23 0307  WBC 14.5*  --   --  14.0* 14.6*  --  14.7*  LATICACIDVEN  --  3.3* 5.2* 2.9*  --  0.8  --     Liver Function Tests: Recent Labs  Lab 08/05/23 1305 08/06/23 0549 08/07/23 0348 08/08/23 0307  AST 17  --   --   --   ALT 17  --   --   --   ALKPHOS 84  --   --   --   BILITOT 0.8  --   --   --   PROT 6.5  --   --   --   ALBUMIN  3.2* 3.4* 2.8* 2.9*   No results for input(s): "LIPASE", "AMYLASE" in the last 168 hours. No results for input(s): "AMMONIA" in the last 168 hours.  ABG    Component Value Date/Time   PHART 7.43 08/05/2023 1313   PCO2ART 40 08/05/2023 1313   PO2ART 82 (L) 08/05/2023 1313   HCO3 26.5 08/05/2023 1313   TCO2 30 08/05/2023 0654   O2SAT 98.3 08/05/2023 1313     Coagulation Profile: Recent Labs  Lab 08/05/23 1305  INR 1.3*    Cardiac Enzymes: No results for input(s): "CKTOTAL", "CKMB", "CKMBINDEX", "TROPONINI" in the last 168 hours.  HbA1C: HB A1C (BAYER DCA - WAIVED)  Date/Time Value Ref Range Status  01/12/2023 02:13 PM 5.9 (H) 4.8 - 5.6 % Final    Comment:             Prediabetes: 5.7 - 6.4          Diabetes: >6.4          Glycemic control for adults with diabetes: <7.0   10/12/2022 02:20 PM 5.9 (H) 4.8 - 5.6 % Final    Comment:             Prediabetes: 5.7 - 6.4          Diabetes: >6.4          Glycemic control for adults with diabetes: <7.0    Hgb A1c MFr Bld  Date/Time Value Ref Range Status  08/06/2023 05:49 AM 6.0 (H) 4.8 - 5.6 % Final    Comment:    (NOTE) Pre diabetes:          5.7%-6.4%  Diabetes:              >  6.4%  Glycemic control for   <7.0% adults with diabetes   02/16/2023 03:54 PM 6.4 (H) 4.8 - 5.6 % Final    Comment:    (NOTE) Pre diabetes:          5.7%-6.4%  Diabetes:              >6.4%  Glycemic control for   <7.0% adults with diabetes      CBG: Recent Labs  Lab 08/07/23 1602 08/07/23 1931 08/07/23 2306 08/08/23 0310 08/08/23 0736  GLUCAP 92 120* 164* 121* 92    Review of Systems:   Unable to assess due to intubation/sedation/critical illness   Past Medical History:  He,  has a past medical history of Anemia in chronic kidney disease, Cellulitis of left lower extremity, Chronic atrial fibrillation (HCC), Chronic kidney disease, Congestive heart failure (HCC), Difficult intubation, End-stage renal disease on hemodialysis (HCC), Hyperlipidemia, Hypertension, and Type 2 diabetes mellitus with complication (HCC).   Surgical History:   Past Surgical History:  Procedure Laterality Date   A/V FISTULAGRAM Left 08/27/2022   Procedure: A/V Fistulagram;  Surgeon: Celso College, MD;  Location: [facility];  Service: Cardiovascular;  Laterality: Left;   AMPUTATION Right 03/13/2022   Procedure: AMPUTATION ABOVE KNEE;  Surgeon: Celso College, MD;  Location: [facility];  Service: General;  Laterality: Right;   APPLICATION OF WOUND VAC Right 03/07/2022   Procedure: APPLICATION OF WOUND VAC;  Surgeon: Lesta Rater, MD;  Location: [facility];  Service: Vascular;  Laterality: Right;  GAAC [id number]   CENTRAL LINE INSERTION Left 08/05/2023   Procedure: CENTRAL LINE INSERTION;  Surgeon: Celso College, MD;  Location: [facility];  Service: Vascular;  Laterality: Left;   DIALYSIS/PERMA CATHETER INSERTION Right 08/05/2023   Procedure: DIALYSIS/PERMA CATHETER INSERTION;  Surgeon: Celso College, MD;  Location: [facility];  Service: Vascular;  Laterality: Right;   GROIN DISSECTION Right 08/05/2023   Procedure: right groin venogram, angioplasty of superior venacava and anomic vein;  Surgeon: Celso College, MD;  Location: [facility];  Service: Vascular;  Laterality: Right;   LOWER EXTREMITY ANGIOGRAPHY Left 02/15/2023   Procedure: Lower Extremity Angiography;  Surgeon: Celso College, MD;  Location: [facility];  Service: Cardiovascular;   Laterality: Left;   REVISON OF ARTERIOVENOUS FISTULA Left 08/05/2023   Procedure: REVISON OF ARTERIOVENOUS FISTULA;  Surgeon: Celso College, MD;  Location: [facility];  Service: Vascular;  Laterality: Left;  JUMP GRAFT   WOUND DEBRIDEMENT Left    WOUND DEBRIDEMENT Right 01/16/2022   Procedure: DEBRIDEMENT WOUND;  Surgeon: Jackquelyn Mass, MD;  Location: [facility];  Service: Vascular;  Laterality: Right;  Wound vac placement   WOUND DEBRIDEMENT Right 03/07/2022   Procedure: DEBRIDEMENT WOUND RIGHT LOWER EXTREMITY;  Surgeon: Lesta Rater, MD;  Location: [facility];  Service: Vascular;  Laterality: Right;     Social History:   reports that he has never smoked. He has never used smokeless tobacco. He reports that he does not currently use alcohol. He reports that he does not use drugs.   Family History:  His family history includes Cirrhosis in his father; Heart failure in his mother; Hypertension in his daughter.   Allergies Allergies  Allergen Reactions   Ivp Dye [Iodinated Contrast Media] Anaphylaxis   Tramadol  Rash     Home Medications  Prior to Admission medications   Medication Sig Start Date End Date Taking? Authorizing Provider  amiodarone  (PACERONE ) 200 MG tablet Take  1 tablet (200 mg total) by mouth 2 (two) times daily. 10/12/22  Yes Cannady, Jolene T, NP  aspirin  EC 81 MG tablet Take 1 tablet (81 mg total) by mouth daily. Swallow whole. 03/05/23  Yes Althia Atlas, MD  carvedilol  (COREG ) 6.25 MG tablet Take 6.25 mg by mouth daily. 06/25/23  Yes [provider]  ELIQUIS  2.5 MG TABS tablet Take 1 tablet (2.5 mg total) by mouth 2 (two) times daily. 10/12/22  Yes Cannady, Jolene T, NP  iron  polysaccharides (NIFEREX) 150 MG capsule Take 1 capsule (150 mg total) by mouth daily. 03/05/23  Yes Althia Atlas, MD  melatonin 3 MG TABS tablet Take 3 mg by mouth at bedtime.   Yes [provider]  midodrine (PROAMATINE) 5 MG tablet Take 5 mg by mouth as needed (post dialysis  hypotension SBP<90 or DBP<55).   Yes [provider]  rosuvastatin  (CRESTOR ) 40 MG tablet Take 1 tablet (40 mg total) by mouth daily. 10/12/22  Yes Cannady, Jolene T, NP  SANTYL  250 UNIT/GM ointment Apply 1 Application topically daily. 02/08/23  Yes [provider]  senna (SENOKOT) 8.6 MG TABS tablet Take 2 tablets (17.2 mg total) by mouth at bedtime. 04/03/22  Yes Love, Renay Carota, PA-C  sevelamer  carbonate (RENVELA ) 800 MG tablet Take 2,400 mg by mouth 3 (three) times daily. 09/21/22  Yes [provider]  traZODone  (DESYREL ) 100 MG tablet Take 1 tablet (100 mg total) by mouth at bedtime. 01/12/23  Yes Cannady, Jolene T, NP  acetaminophen  (TYLENOL ) 325 MG tablet Take 2 tablets (650 mg total) by mouth every 6 (six) hours as needed for mild pain (pain score 1-3), fever or headache (or Fever >/= 101). 03/04/23   Althia Atlas, MD  oxyCODONE -acetaminophen  (PERCOCET) 10-325 MG tablet Take 1 tablet by mouth every 6 (six) hours as needed for pain. 08/05/23   Celso College, MD     Critical care provider statement:   Total critical care time: 34 minutes   Performed by: Jaclynn Mast MD   Critical care time was exclusive of separately billable procedures and treating other patients.   Critical care was necessary to treat or prevent imminent or life-threatening deterioration.   Critical care was time spent personally by me on the following activities: development of treatment plan with patient and/or surrogate as well as nursing, discussions with consultants, evaluation of patient's response to treatment, examination of patient, obtaining history from patient or surrogate, ordering and performing treatments and interventions, ordering and review of laboratory studies, ordering and review of radiographic studies, pulse oximetry and re-evaluation of patient's condition.    Divina Neale, M.D.  Pulmonary & Critical Care Medicine

## 2023-08-08 NOTE — Progress Notes (Signed)
 Received patient in bed .  Responsive to pain, intubated.  Informed consent signed and in chart.   TX duration:3.5 hours  Patient tolerated well., goal lowered to 1700 ml Hand-off given to patient's nurse.   Access used: HD cath  Access issues: none  Total UF removed: Medication(s) given: heplock 2.6 units per port Post HD VS: see table below Post HD weight: 108.8kg   08/08/23 0053  Vitals  Temp 98.4 F (36.9 C)  Temp Source Rectal  BP 135/66  MAP (mmHg) 80  BP Location Right Arm  BP Method Automatic  Patient Position (if appropriate) Lying  Pulse Rate 63  Pulse Rate Source Monitor  ECG Heart Rate 66  Resp 16  Oxygen Therapy  SpO2 97 %  O2 Device Ventilator  Patient Activity (if Appropriate) In bed  Pulse Oximetry Type Continuous  During Treatment Monitoring  Blood Flow Rate (mL/min) 0 mL/min  Arterial Pressure (mmHg) -1.82 mmHg  Venous Pressure (mmHg) -1.01 mmHg  TMP (mmHg) 19.59 mmHg  Ultrafiltration Rate (mL/min) 700 mL/min  Dialysate Flow Rate (mL/min) 300 ml/min  Dialysate Potassium Concentration 2  Dialysate Calcium  Concentration 2.5  Duration of HD Treatment -hour(s) 3.5 hour(s)  Cumulative Fluid Removed (mL) per Treatment  1700.11  HD Safety Checks Performed Yes  Intra-Hemodialysis Comments Tolerated well  Post Treatment  Dialyzer Clearance Clear  Hemodialysis Intake (mL) 0 mL  Liters Processed 84  Fluid Removed (mL) 1700 mL  Tolerated HD Treatment Yes  Post-Hemodialysis Comments goal met  Hemodialysis Catheter Right Femoral vein  Placement Date/Time: 08/05/23 1142   Placed prior to admission: No  Serial / Lot #: [id number]  Time Out: Correct patient;Correct site;Correct procedure  Maximum sterile barrier precautions: Hand hygiene;Cap;Mask;Sterile gown;Sterile gloves;Large ster...  Site Condition No complications  Blue Lumen Status Flushed;Heparin  locked;Dead end cap in place  Red Lumen Status Flushed;Heparin  locked;Dead end cap in place   Purple Lumen Status N/A  Catheter fill solution Heparin  1000 units/ml  Post treatment catheter status Capped and Clamped      [facility]

## 2023-08-08 NOTE — Progress Notes (Signed)
 [facility]  Daily Progress Note   Subjective  -   Feels well with no complaints.  Objective Vitals:   08/08/23 1200 08/08/23 1215 08/08/23 1230 08/08/23 1245  BP: (!) 100/51 (!) 93/49 (!) 96/53 (!) 93/51  Pulse:  66 69   Resp: (!) 22 (!) 26 (!) 25 (!) 25  Temp: 99 F (37.2 C) 99 F (37.2 C) 99 F (37.2 C) 99 F (37.2 C)  TempSrc:      SpO2:  100% 100%   Weight:      Height:        Intake/Output Summary (Last 24 hours) at 08/08/2023 1427 Last data filed at 08/08/2023 1254 Gross per 24 hour  Intake 645.06 ml  Output 1700 ml  Net -1054.94 ml    PULM  CTAB CV  RRR VASC  Neck and groin are fine.  Laboratory CBC    Component Value Date/Time   WBC 14.7 (H) 08/08/2023 0307   HGB 8.5 (L) 08/08/2023 0307   HGB 16.8 01/12/2023 1414   HCT 24.7 (L) 08/08/2023 0307   HCT 51.0 01/12/2023 1414   PLT 131 (L) 08/08/2023 0307   PLT 257 01/12/2023 1414    BMET    Component Value Date/Time   NA 133 (L) 08/08/2023 0307   NA 136 04/14/2023 1610   K 3.8 08/08/2023 0307   CL 93 (L) 08/08/2023 0307   CO2 27 08/08/2023 0307   GLUCOSE 135 (H) 08/08/2023 0307   BUN 26 (H) 08/08/2023 0307   BUN 18 04/14/2023 1610   CREATININE 5.26 (H) 08/08/2023 0307   CALCIUM  8.4 (L) 08/08/2023 0307   GFRNONAA 12 (L) 08/08/2023 0307    Assessment/Planning: POD #3 s/p RIJ HD catheter complicated by right hemothorax.  Chest tube now out.  Extubated.  Feels well.  His BP is soft but labs OK and he feels fine.  I would recommend he be observed and perhaps some IVF as he is not symptomatic and is awake and alert.  There is nothing we need to perfuse with a higher BP.  Otherwise appreciate ICU care and CT removal.  Left forearm AVF is thrombosed.  He will likely need a new tunneled catheter this week and in the future a new access in the arm.   Jerri Morale  08/08/2023, 2:27 PM

## 2023-08-08 NOTE — Progress Notes (Signed)
 Informed pts daughter [name] via telephone pt has been successfully extubated and is pending chest tube removal.  Will continue to monitor and assess pt.  Janey Meek, AGNP  Pulmonary/Critical Care Pager [phone] (please enter 7 digits) PCCM Consult Pager [phone] (please enter 7 digits)

## 2023-08-08 NOTE — Plan of Care (Signed)
 Patient extubated this AM. He is on 2 liters of oxygen, no complaints of shortness of breath or pian. Held amiodarone  due to patients vitals. Patient passes swallowing evaluation and diet ordered placed. Midodrine ordered due to patients BP, levophed titrated for MAP of 65, per Dr. Jaclynn Mast. Continue to assess.

## 2023-08-09 ENCOUNTER — Inpatient Hospital Stay (HOSPITAL_COMMUNITY)
Admission: AD | Admit: 2023-08-09 | Discharge: 2023-08-09 | Disposition: A | Discharge status: Home / Self Care | Source: Home / Self Care | Attending: Pulmonary Disease | Admitting: Pulmonary Disease

## 2023-08-09 ENCOUNTER — Inpatient Hospital Stay

## 2023-08-09 DIAGNOSIS — R578 Other shock: Secondary | ICD-10-CM

## 2023-08-09 DIAGNOSIS — Z992 Dependence on renal dialysis: Secondary | ICD-10-CM

## 2023-08-09 DIAGNOSIS — L8990 Pressure ulcer of unspecified site, unspecified stage: Secondary | ICD-10-CM | POA: Insufficient documentation

## 2023-08-09 DIAGNOSIS — R579 Shock, unspecified: Secondary | ICD-10-CM

## 2023-08-09 DIAGNOSIS — J942 Hemothorax: Secondary | ICD-10-CM

## 2023-08-09 LAB — CBC
HCT: 24.5 % — ABNORMAL LOW (ref 39.0–52.0)
Hemoglobin: 8 g/dL — ABNORMAL LOW (ref 13.0–17.0)
MCH: 28 pg (ref 26.0–34.0)
MCHC: 32.7 g/dL (ref 30.0–36.0)
MCV: 85.7 fL (ref 80.0–100.0)
Platelets: 124 10*3/uL — ABNORMAL LOW (ref 150–400)
RBC: 2.86 MIL/uL — ABNORMAL LOW (ref 4.22–5.81)
RDW: 15.7 % — ABNORMAL HIGH (ref 11.5–15.5)
WBC: 12.9 10*3/uL — ABNORMAL HIGH (ref 4.0–10.5)
nRBC: 0.6 % — ABNORMAL HIGH (ref 0.0–0.2)

## 2023-08-09 LAB — BASIC METABOLIC PANEL WITH GFR
Anion gap: 14 (ref 5–15)
BUN: 48 mg/dL — ABNORMAL HIGH (ref 8–23)
CO2: 26 mmol/L (ref 22–32)
Calcium: 8.1 mg/dL — ABNORMAL LOW (ref 8.9–10.3)
Chloride: 92 mmol/L — ABNORMAL LOW (ref 98–111)
Creatinine, Ser: 7.38 mg/dL — ABNORMAL HIGH (ref 0.61–1.24)
GFR, Estimated: 8 mL/min — ABNORMAL LOW (ref >60)
Glucose, Bld: 149 mg/dL — ABNORMAL HIGH (ref 70–99)
Potassium: 4 mmol/L (ref 3.5–5.1)
Sodium: 132 mmol/L — ABNORMAL LOW (ref 135–145)

## 2023-08-09 LAB — ECHOCARDIOGRAM COMPLETE
AR max vel: 1.95 cm2
AV Area VTI: 1.92 cm2
AV Area mean vel: 2.03 cm2
AV Mean grad: 4.7 mmHg
AV Peak grad: 9.7 mmHg
Ao pk vel: 1.55 m/s
Area-P 1/2: 3.06 cm2
Calc EF: 65.9 %
Height: 72 in
S' Lateral: 3 cm
Single Plane A2C EF: 62.3 %
Single Plane A4C EF: 69.6 %
Weight: 3837.77 [oz_av]

## 2023-08-09 LAB — GLUCOSE, CAPILLARY
Glucose-Capillary: 117 mg/dL — ABNORMAL HIGH (ref 70–99)
Glucose-Capillary: 126 mg/dL — ABNORMAL HIGH (ref 70–99)
Glucose-Capillary: 127 mg/dL — ABNORMAL HIGH (ref 70–99)
Glucose-Capillary: 140 mg/dL — ABNORMAL HIGH (ref 70–99)
Glucose-Capillary: 169 mg/dL — ABNORMAL HIGH (ref 70–99)
Glucose-Capillary: 222 mg/dL — ABNORMAL HIGH (ref 70–99)

## 2023-08-09 LAB — PHOSPHORUS: Phosphorus: 4.3 mg/dL (ref 2.5–4.6)

## 2023-08-09 LAB — MAGNESIUM: Magnesium: 2.4 mg/dL (ref 1.7–2.4)

## 2023-08-09 LAB — LACTIC ACID, PLASMA: Lactic Acid, Venous: 1.2 mmol/L (ref 0.5–1.9)

## 2023-08-09 LAB — CORTISOL: Cortisol, Plasma: 1.5 ug/dL

## 2023-08-09 MED ORDER — NEPRO/CARBSTEADY PO LIQD
237.0000 mL | Freq: Three times a day (TID) | ORAL | Status: DC
  Administered 2023-08-09 – 2023-08-15 (×17): 237 mL via ORAL

## 2023-08-09 MED ORDER — KETOTIFEN FUMARATE 0.035 % OP SOLN
1.0000 [drp] | Freq: Two times a day (BID) | OPHTHALMIC | Status: DC
  Administered 2023-08-09 – 2023-08-15 (×11): 1 [drp] via OPHTHALMIC
  Filled 2023-08-09: qty 5

## 2023-08-09 MED ORDER — ZINC SULFATE 220 (50 ZN) MG PO CAPS
220.0000 mg | ORAL_CAPSULE | Freq: Every day | ORAL | Status: DC
  Administered 2023-08-09 – 2023-08-16 (×8): 220 mg via ORAL
  Filled 2023-08-09 (×8): qty 1

## 2023-08-09 MED ORDER — HYDROCORTISONE SOD SUC (PF) 100 MG IJ SOLR
50.0000 mg | Freq: Four times a day (QID) | INTRAMUSCULAR | Status: DC
  Administered 2023-08-09 – 2023-08-10 (×4): 50 mg via INTRAVENOUS
  Filled 2023-08-09: qty 1
  Filled 2023-08-09 (×3): qty 2
  Filled 2023-08-09: qty 1
  Filled 2023-08-09: qty 2

## 2023-08-09 MED ORDER — ALBUMIN HUMAN 25 % IV SOLN
12.5000 g | Freq: Once | INTRAVENOUS | Status: AC
  Administered 2023-08-09: 12.5 g via INTRAVENOUS
  Filled 2023-08-09: qty 50

## 2023-08-09 MED ORDER — EPOETIN ALFA-EPBX 10000 UNIT/ML IJ SOLN
10000.0000 [IU] | INTRAMUSCULAR | Status: DC
  Administered 2023-08-09: 10000 [IU] via SUBCUTANEOUS
  Filled 2023-08-09 (×2): qty 2

## 2023-08-09 MED ORDER — VITAMIN C 500 MG PO TABS
500.0000 mg | ORAL_TABLET | Freq: Two times a day (BID) | ORAL | Status: DC
  Administered 2023-08-09 – 2023-08-16 (×15): 500 mg via ORAL
  Filled 2023-08-09 (×16): qty 1

## 2023-08-09 MED ORDER — DOCUSATE SODIUM 100 MG PO CAPS
100.0000 mg | ORAL_CAPSULE | Freq: Two times a day (BID) | ORAL | Status: DC
  Administered 2023-08-09 – 2023-08-10 (×2): 100 mg via ORAL
  Filled 2023-08-09 (×6): qty 1

## 2023-08-09 MED ORDER — POLYETHYLENE GLYCOL 3350 17 G PO PACK
17.0000 g | PACK | Freq: Every day | ORAL | Status: DC
Start: 2023-08-09 — End: 2023-08-10
  Administered 2023-08-09: 17 g via ORAL

## 2023-08-09 MED ORDER — RENA-VITE PO TABS
1.0000 | ORAL_TABLET | Freq: Every day | ORAL | Status: DC
  Administered 2023-08-09 – 2023-08-15 (×7): 1 via ORAL
  Filled 2023-08-09 (×7): qty 1

## 2023-08-09 NOTE — Progress Notes (Signed)
  Progress Note    08/09/2023 7:45 AM 4 Days Post-Op  Subjective: Patient is resting comfortably in ICU this morning eating breakfast.  No complaints overnight.  Patient remains on blood pressure support medication at this time.  Patient endorses he is scheduled for dialysis later today as he is on a Monday Wednesday Friday schedule.  Patient has temporary dialysis catheter in his right groin.   Vitals:   08/09/23 0645 08/09/23 0700  BP: (!) 90/55 (!) 97/57  Pulse:    Resp: (!) 25 (!) 21  Temp: 98.3 F (36.8 C)   SpO2: 100% 100%   Physical Exam: Cardiac:  RRR, normal S1-S2, no rubs clicks gallops or murmurs. Lungs: Lungs are rhonchorous on auscultation throughout.  Unable to hear breath sounds in the right lower and right middle lobe.  Left lower lobe able to auscultate breath sounds and moving air.  Patient remains ventilated Incisions: Bilateral groin incisions for dialysis catheter placement triple-lumen placement.  In right jugular vein incision for permacath placement that was removed. Extremities: Unable to palpate pulses throughout due to blood pressure support medication.  All extremities cool to touch this morning. Abdomen: Bowel sounds throughout on auscultation, soft, nontender and nondistended. Neurologic: Patient is sedated and ventilated at this time CBC    Component Value Date/Time   WBC 14.7 (H) 08/08/2023 0307   RBC 2.96 (L) 08/08/2023 0307   HGB 7.7 (L) 08/08/2023 1530   HGB 16.8 01/12/2023 1414   HCT 23.0 (L) 08/08/2023 1530   HCT 51.0 01/12/2023 1414   PLT 131 (L) 08/08/2023 0307   PLT 257 01/12/2023 1414   MCV 83.4 08/08/2023 0307   MCV 88 01/12/2023 1414   MCH 28.7 08/08/2023 0307   MCHC 34.4 08/08/2023 0307   RDW 15.7 (H) 08/08/2023 0307   RDW 14.8 01/12/2023 1414   LYMPHSABS 1.6 08/05/2023 1305   LYMPHSABS 1.0 01/12/2023 1414   MONOABS 0.4 08/05/2023 1305   EOSABS 0.1 08/05/2023 1305   EOSABS 0.2 01/12/2023 1414   BASOSABS 0.1 08/05/2023 1305    BASOSABS 0.1 01/12/2023 1414    BMET    Component Value Date/Time   NA 132 (L) 08/09/2023 0208   NA 136 04/14/2023 1610   K 4.0 08/09/2023 0208   CL 92 (L) 08/09/2023 0208   CO2 26 08/09/2023 0208   GLUCOSE 149 (H) 08/09/2023 0208   BUN 48 (H) 08/09/2023 0208   BUN 18 04/14/2023 1610   CREATININE 7.38 (H) 08/09/2023 0208   CALCIUM  8.1 (L) 08/09/2023 0208   GFRNONAA 8 (L) 08/09/2023 0208    INR    Component Value Date/Time   INR 1.3 (H) 08/05/2023 1305     Intake/Output Summary (Last 24 hours) at 08/09/2023 0745 Last data filed at 08/09/2023 0741 Gross per 24 hour  Intake 416.38 ml  Output --  Net 416.38 ml     Assessment/Plan:  63 y.o. male is s/p RIJ HD catheter complicated by right hemothorax with chest tube.  4 Days Post-Op   PLAN Chest tube is now been removed.  Patient remains extubated and breathing fine with no respiratory issues.  [name] and awake this morning but still continuing to need blood pressure support. Left forearm aVF is thrombosed therefore he will need a tunneled catheter sometime this week for outpatient hemodialysis.  DVT prophylaxis: Heparin  with dialysis   Annamaria Barrette [facility] 08/09/2023 7:45 AM

## 2023-08-09 NOTE — Progress Notes (Signed)
 Nutrition Follow-up  DOCUMENTATION CODES:   Obesity unspecified  INTERVENTION:   -Feeding assistance with meals -Renal MVI daily -500 mg vitamin C BID -220 mg zinc sulfate daily x 14 days -Increase Nepro Shake po to TID, each supplement provides 425 kcal and 19 grams protein  -Downgrade diet to dysphagia 3 diet for ease of intake  NUTRITION DIAGNOSIS:   Inadequate oral intake related to inability to eat as evidenced by NPO status.  Progressing; advanced to PO diet on 08/08/23  GOAL:   Patient will meet greater than or equal to 90% of their needs  Progressing  MONITOR:   PO intake, Supplement acceptance  REASON FOR ASSESSMENT:   Ventilator    ASSESSMENT:   Pt with past medical history significant for ESRD on HD, HFrEF, hypertension, hyperlipidemia presenting for elective AV fistula revision.  PermCath placement complicated by bleeding, concern for potential anaphylactic reaction to IV contrast, and shock.  5/22- s/p Jump graft revision of left radiocephalic arteriovenous fistula with 6 mm Artegraft, Ligation of left radiocephalic AV fistula  5/23- chest tube placed 5/24- chest tube clamped 5/25- extubated, chest tube removed, advanced to renal diet with 1.2 L fluid restriction  Reviewed I/O's: +140 ml x 24 hours and +98 ml since admission  Per VVS notes, pt will likely need a new tunneled catheter this week and a new arm access in the future.   Pt unavailable at time of visit. Attempted to speak with pt via call to hospital room phone, however, unable to reach.   Pt currently on a renal diet with 1.2 L fluid restriction. No meal completion data available to assess at this time.   Case discussed with RN, who reports pt requires assistance with feeding and is having some difficulty tolerating solid foods. He consumed grits, Nepro, and some sausage for breakfast today.   No wt loss since admission.  Medications reviewed and include colace, miralax , levophed, and  zosyn .  Labs reviewed: Na: 132, K and Phos WDL. CBGS: 126-127 (inpatient orders for glycemic control are 0-9 units insulin  aspart every 4 hours).    Diet Order:   Diet Order             Diet renal with fluid restriction Fluid restriction: 1200 mL Fluid; Room service appropriate? Yes; Fluid consistency: Thin  Diet effective now                   EDUCATION NEEDS:   Not appropriate for education at this time  Skin:  Skin Assessment: Skin Integrity Issues: Skin Integrity Issues:: Diabetic Ulcer, Other (Comment) Stage II: - Diabetic Ulcer: lt foot Other: cellulitis to lt leg  Last BM:  08/05/23  Height:   Ht Readings from Last 1 Encounters:  08/05/23 6' (1.829 m)    Weight:   Wt Readings from Last 1 Encounters:  08/08/23 108.8 kg    Ideal Body Weight:  74.4 kg (adjusted for rt AKA)  BMI:  Body mass index is 32.53 kg/m.  Estimated Nutritional Needs:   Kcal:  2200-2400  Protein:  115-130 grams  Fluid:  1000 ml + UOP    Herschel Lords, RD, LDN, CDCES Registered Dietitian III Certified Diabetes Care and Education Specialist If unable to reach this RD, please use "RD Inpatient" group chat on secure chat between hours of 8am-4 pm daily

## 2023-08-09 NOTE — Progress Notes (Signed)
 [facility]  Daily Progress Note   Subjective  -   No complaints.  Feels fine.  No signs of infection.  No pain and procedure sites.  Objective Vitals:   08/09/23 1200 08/09/23 1215 08/09/23 1230 08/09/23 1245  BP: 94/63 (!) 93/52 (!) 93/50 (!) 95/54  Pulse:      Resp: 19 19 (!) 24 20  Temp:      TempSrc:      SpO2: 100%     Weight:      Height:        Intake/Output Summary (Last 24 hours) at 08/09/2023 1304 Last data filed at 08/09/2023 1137 Gross per 24 hour  Intake 329.06 ml  Output --  Net 329.06 ml    PULM  CTAB CV  RRR VASC  Unchanged  Laboratory CBC    Component Value Date/Time   WBC 12.9 (H) 08/09/2023 0850   HGB 8.0 (L) 08/09/2023 0850   HGB 16.8 01/12/2023 1414   HCT 24.5 (L) 08/09/2023 0850   HCT 51.0 01/12/2023 1414   PLT 124 (L) 08/09/2023 0850   PLT 257 01/12/2023 1414    BMET    Component Value Date/Time   NA 132 (L) 08/09/2023 0208   NA 136 04/14/2023 1610   K 4.0 08/09/2023 0208   CL 92 (L) 08/09/2023 0208   CO2 26 08/09/2023 0208   GLUCOSE 149 (H) 08/09/2023 0208   BUN 48 (H) 08/09/2023 0208   BUN 18 04/14/2023 1610   CREATININE 7.38 (H) 08/09/2023 0208   CALCIUM  8.1 (L) 08/09/2023 0208   GFRNONAA 8 (L) 08/09/2023 0208    Assessment/Planning: POD #5 s/p right hemothorax  No further Vascular suggestions.  Needs tunneled catheter this week and evaluation of left arm fistula   Adam Howell  08/09/2023, 1:04 PM

## 2023-08-09 NOTE — Progress Notes (Signed)
 [facility]  ROUNDING NOTE   Subjective:   Adam Howell is a 63 y.o male with past medical history of diabetes, hypertension, CAD, Rt AKA, anemia, CHF, and ESRD on dialysis. Patient presents to vascular surgery for revision of AVF. He is being admitted for End stage renal disease (HCC) [N18.6] ESRD (end stage renal disease) (HCC) [N18.6]  Patient had anaphylactic shock prior to his procedure.   Patient had a right femoral temp HD catheter placed and receiving dialysis.   Back on norepinephrine today.   Patient states he is doing well. He is in good spirits.    Objective:  Vital signs in last 24 hours:  Temp:  [98 F (36.7 C)-99.9 F (37.7 C)] 98.3 F (36.8 C) (05/26 0645) Pulse Rate:  [59-76] 63 (05/26 0615) Resp:  [0-34] 21 (05/26 0700) BP: (73-125)/(35-63) 97/57 (05/26 0700) SpO2:  [97 %-100 %] 100 % (05/26 0700)  Weight change:  Filed Weights   08/05/23 2310 08/07/23 2108 08/08/23 0054  Weight: 102.1 kg 110.9 kg 108.8 kg    Intake/Output: I/O last 3 completed shifts: In: 757.9 [I.V.:399.7; IV Piggyback:358.1] Out: 1700 [Other:1700]   Intake/Output this shift:  Total I/O In: 67.1 [I.V.:17.1; IV Piggyback:50] Out: -   Physical Exam: General: Laying in bed, leaning to the left  Head: [place]/AT  Eyes: Anicteric  Lungs:  clear  Heart: Regular rate and rhythm  Abdomen:  Soft, nontender, obese  Extremities: No peripheral edema  Right AKA  Neurologic: Alert and oriented  Skin: No lesions  Access: Left aVF (no bruit or thrill) right HD femoral temp cath    Basic Metabolic Panel: Recent Labs  Lab 08/05/23 1305 08/06/23 0549 08/07/23 0348 08/08/23 0307 08/09/23 0208  NA 133* 132* 132* 133* 132*  K 5.5* 5.5* 4.7 3.8 4.0  CL 95* 95* 94* 93* 92*  CO2 26 25 25 27 26   GLUCOSE 243* 174* 154* 135* 149*  BUN 34* 29* 46* 26* 48*  CREATININE 8.72* 6.85* 8.41* 5.26* 7.38*  CALCIUM  8.1* 8.8* 8.4* 8.4* 8.1*  MG  --   --   --  1.9 2.4  PHOS  --   3.0 3.9 4.4 4.3    Liver Function Tests: Recent Labs  Lab 08/05/23 1305 08/06/23 0549 08/07/23 0348 08/08/23 0307  AST 17  --   --   --   ALT 17  --   --   --   ALKPHOS 84  --   --   --   BILITOT 0.8  --   --   --   PROT 6.5  --   --   --   ALBUMIN  3.2* 3.4* 2.8* 2.9*   No results for input(s): "LIPASE", "AMYLASE" in the last 168 hours. No results for input(s): "AMMONIA" in the last 168 hours.  CBC: Recent Labs  Lab 08/05/23 0654 08/05/23 1305 08/05/23 2143 08/06/23 0557 08/06/23 1451 08/06/23 2104 08/07/23 0348 08/08/23 0307 08/08/23 1530 08/09/23 0850  WBC 3.8* 15.0*   < > 14.5*  --  14.0* 14.6* 14.7*  --  12.9*  NEUTROABS 1.7 12.6*  --   --   --   --   --   --   --   --   HGB 14.5  16.3 11.9*   < > 10.6*   < > 8.4* 8.4* 8.5* 7.7* 8.0*  HCT 45.4  48.0 37.1*   < > 31.8*   < > 24.7* 24.8* 24.7* 23.0* 24.5*  MCV 88.2 88.5   < >  85.9  --  84.3 83.8 83.4  --  85.7  PLT 108* 160   < > 152  --  141* 141* 131*  --  124*   < > = values in this interval not displayed.    Cardiac Enzymes: No results for input(s): "CKTOTAL", "CKMB", "CKMBINDEX", "TROPONINI" in the last 168 hours.  BNP: Invalid input(s): "POCBNP"  CBG: Recent Labs  Lab 08/08/23 1547 08/08/23 1922 08/08/23 2317 08/09/23 0339 08/09/23 0738  GLUCAP 113* 159* 122* 127* 126*    Microbiology: Results for orders placed or performed during the hospital encounter of 08/05/23  Culture, Respiratory w Gram Stain     Status: None (Preliminary result)   Collection Time: 08/07/23  8:09 AM   Specimen: Tracheal Aspirate; Respiratory  Result Value Ref Range Status   Specimen Description   Final    TRACHEAL ASPIRATE Performed at [facility], [address], [place], [place] [address]    Special Requests   Final    NONE Performed at [facility], [address] [place], [place] [address]    Gram Stain   Final    FEW WBC PRESENT, PREDOMINANTLY MONONUCLEAR FEW GRAM POSITIVE COCCI IN  CLUSTERS    Culture   Final    NO GROWTH < 12 HOURS Performed at [facility], [address] [place], [place] [address]    Report Status PENDING  Incomplete    Coagulation Studies: No results for input(s): "LABPROT", "INR" in the last 72 hours.   Urinalysis: No results for input(s): "COLORURINE", "LABSPEC", "PHURINE", "GLUCOSEU", "HGBUR", "BILIRUBINUR", "KETONESUR", "PROTEINUR", "UROBILINOGEN", "NITRITE", "LEUKOCYTESUR" in the last 72 hours.  Invalid input(s): "APPERANCEUR"    Imaging: DG Chest Port 1 View Result Date: 08/09/2023 CLINICAL DATA:  End-stage renal disease. EXAM: PORTABLE CHEST 1 VIEW COMPARISON:  08/08/2023 FINDINGS: The cardio pericardial silhouette is enlarged. Left base collapse/consolidation appears progressive in the interval. No overt pulmonary edema or substantial pleural effusion. No acute bony abnormality. Telemetry leads overlie the chest. Trace soft tissue gas again seen in the right supraclavicular region. IMPRESSION: Progressive left base collapse/consolidation. No evidence for pneumothorax with persistent minimal soft tissue gas in the right supraclavicular region. Electronically Signed   By: Donnal Fusi M.D.   On: 08/09/2023 09:16   DG Chest Port 1 View Result Date: 08/08/2023 CLINICAL DATA:  Chest tube removal. EXAM: PORTABLE CHEST 1 VIEW COMPARISON:  08/08/2023 at 5:53 a.m. FINDINGS: Interval removal of nasogastric tube, enteric tube and right-sided chest tube. A catheter remains in place with tip projecting over the right atrium unchanged. Lungs are adequately inflated and otherwise clear. No evidence of right-sided pneumothorax. Cardiomediastinal silhouette and remainder of the exam is unchanged. IMPRESSION: Interval removal of right-sided chest tube without evidence of right-sided pneumothorax. Electronically Signed   By: Roda Cirri M.D.   On: 08/08/2023 11:14   DG Chest Port 1 View Result Date: 08/08/2023 CLINICAL DATA:  Chest tube in place  EXAM: PORTABLE CHEST 1 VIEW COMPARISON:  Yesterday FINDINGS: Endotracheal tube with tip at the clavicular heads. An enteric tube at least reaches the stomach. Central line from below in stable position. Right chest tube in place with no visible pneumothorax or hemothorax. Cardiac enlargement. IMPRESSION: Stable hardware positioning and normal aeration. Electronically Signed   By: Ronnette Coke M.D.   On: 08/08/2023 08:56   DG Chest Port 1 View Result Date: 08/07/2023 CLINICAL DATA:  Respiratory failure. EXAM: PORTABLE CHEST 1 VIEW COMPARISON:  08/07/2023, 6:52 a.m. FINDINGS: The  heart size and mediastinal contours are within normal limits. Minimal subsegmental atelectasis or scarring at the left base. Normal pulmonary vasculature. No pneumothorax or pleural effusion. Aorta is calcified. The visualized skeletal structures are unremarkable. Endotracheal tube tip at the thoracic inlet. Right-sided chest tube tip in the midline. NG tube tip superimposed with stomach with the side port superimposed with distal esophagus. NG tube should be advanced about 8 cm. IMPRESSION: No active disease. Electronically Signed   By: Sydell Eva M.D.   On: 08/07/2023 22:47     Medications:    norepinephrine (LEVOPHED) Adult infusion 3 mcg/min (08/09/23 0741)   piperacillin -tazobactam (ZOSYN )  IV Stopped (08/09/23 0704)    amiodarone   200 mg Oral Daily   Chlorhexidine  Gluconate Cloth  6 each Topical Q0600   docusate sodium   100 mg Oral BID   feeding supplement (NEPRO CARB STEADY)  237 mL Oral BID BM   hydrocerin   Topical Daily   insulin  aspart  0-9 Units Subcutaneous Q4H   iron  polysaccharides  150 mg Oral Daily   ketotifen  1 drop Both Eyes BID   lidocaine   5 mL Intradermal Once   midodrine  10 mg Oral TID   polyethylene glycol  17 g Oral Daily   rosuvastatin   40 mg Oral Daily   alteplase , alum & mag hydroxide-simeth, guaiFENesin -dextromethorphan, heparin , ondansetron , mouth rinse,  oxyCODONE -acetaminophen , phenol, potassium chloride  Assessment/ Plan:  Mr. Shahzaib Azevedo is a 63 y.o.  male with end stage renal disease on hemodialysis, diabetes, hypertension, CAD, Rt AKA, anemia, CHF who was admitted to [facility] on 08/05/2023 for revision of AVF. Patient had anaphylactic reaction and admitted to ICU.   [facility] [place]/MWF/left AVF  End-stage renal disease with hyperkalemia on hemodialysis.  With complication of hemodialysis access.  - appreciate vascular input. Will need to assess to see if his AVF can be salvaged. If not, patient will need a tunneled catheter. - hemodialysis treatment for today. Resume MWF schedule.   2. Anemia of chronic kidney disease Lab Results  Component Value Date   HGB 8.0 (L) 08/09/2023    ESA with Monday treatment.   3. Secondary Hyperparathyroidism: with outpatient labs: PTH 410, phosphorus 4.7, calcium  8.8 on 07/19/23.    Lab Results  Component Value Date   CALCIUM  8.1 (L) 08/09/2023   CAION 1.06 (L) 08/05/2023   PHOS 4.3 08/09/2023    Currently not on a binder. Sevelamer  as outpatient.   4. Hypotension: with recent systemic shock.   - weaning vasopressors today  - holding home carvedilol   - continue midodrine.    LOS: 4 Leshay Desaulniers 5/26/202510:05 AM

## 2023-08-09 NOTE — Plan of Care (Signed)
  Problem: Activity: Goal: Ability to tolerate increased activity will improve Outcome: Progressing   Problem: Respiratory: Goal: Ability to maintain a clear airway and adequate ventilation will improve Outcome: Progressing   Problem: Role Relationship: Goal: Method of communication will improve Outcome: Progressing   Problem: Education: Goal: Knowledge of General Education information will improve Description: Including pain rating scale, medication(s)/side effects and non-pharmacologic comfort measures Outcome: Progressing   Problem: Health Behavior/Discharge Planning: Goal: Ability to manage health-related needs will improve Outcome: Progressing

## 2023-08-09 NOTE — Evaluation (Addendum)
 Physical Therapy Evaluation Patient Details Name: Adam Howell MRN: [id number] DOB: 1960/07/24 Today's Date: 08/09/2023  History of Present Illness  Pt is a 64 y/o M admitted on 08/05/23 after presenting for elective AV fistula revision. PermCath placement was complicated by innominate vein perforation with large R hemothorax & concern for potential anaphylactic reaction to IV contrast & shock. PMH: ESRD on HD, HFrEF, HTN, HLD  Clinical Impression  Pt seen for PT evaluation with pt very pleasant & agreeable to tx. Pt reports prior to admission he was residing in a nursing home, able to complete bed mobility & lateral scoots to w/c without assistance. On this date, pt requires mod assist with hospital bed features to complete supine>sit. Pt tolerates sitting EOB ~5 minutes & engages in scooting posteriorly with min assist, scooting to L along EOB with CGA with significantly extra time, rest breaks PRN but good demo of head/hips relationship & sequencing. Pt set up with meal tray at end of session but pt notes difficulty feeding himself 2/2 BUE weakness - encouraged him to attempt finger foods as able. Will continue to follow pt acutely to progress mobility as able.    MD cleared pt for sitting EOB with temp femoral HD catheter via secure chat (Dr. Martina Sledge, vascular MD).      If plan is discharge home, recommend the following: A lot of help with walking and/or transfers;A lot of help with bathing/dressing/bathroom;Assistance with feeding;Assist for transportation;Help with stairs or ramp for entrance;Direct supervision/assist for medications management;Assistance with cooking/housework;Direct supervision/assist for financial management   Can travel by private vehicle   No    Equipment Recommendations Other (comment) (defer to next venue)  Recommendations for Other Services       Functional Status Assessment Patient has had a recent decline in their functional status and demonstrates the  ability to make significant improvements in function in a reasonable and predictable amount of time.     Precautions / Restrictions Precautions Precautions: Fall Precaution/Restrictions Comments: temp femoral HD cath Restrictions Weight Bearing Restrictions Per Provider Order: No      Mobility  Bed Mobility Overal bed mobility: Needs Assistance Bed Mobility: Supine to Sit, Sit to Supine     Supine to sit: Mod assist, HOB elevated, Used rails (cuing for technique, use of BUE on bed rails, pt able to move BLE to EOB, but requires assistance to scoot to sitting EOB with HOB elevated, assistance to upright trunk once HOB flat) Sit to supine: Min assist, Used rails, HOB elevated   General bed mobility comments: Pt able to bridge to reposition in bed    Transfers                        Ambulation/Gait                  Stairs            Wheelchair Mobility     Tilt Bed    Modified Rankin (Stroke Patients Only)       Balance Overall balance assessment: Needs assistance Sitting-balance support: Feet supported, Bilateral upper extremity supported Sitting balance-Leahy Scale: Fair Sitting balance - Comments: CGA sitting EOB                                     Pertinent Vitals/Pain Pain Assessment Pain Assessment: Faces Faces Pain Scale: Hurts little more Pain Location:  LLE Pain Descriptors / Indicators: Discomfort Pain Intervention(s): Monitored during session    Home Living Family/patient expects to be discharged to:: Skilled nursing facility                   Additional Comments: Pt reports he lives at nursing home, unable to recall name but gives address (PT googles it & it's [facility]).    Prior Function               Mobility Comments: Pt reports he is able to complete bed mobility without assistance from hospital bed, performs lateral scoots bed<>w/c with mod I, propels w/c with BUE & mod  I. ADLs Comments: Staff assists with bathing/dressing.     Extremity/Trunk Assessment   Upper Extremity Assessment Upper Extremity Assessment: Generalized weakness;Defer to OT evaluation;Right hand dominant (difficulty with BUE shoulder flexion, unable to feed himself 2/2 BUE weakness)    Lower Extremity Assessment Lower Extremity Assessment: RLE deficits/detail;Generalized weakness RLE Deficits / Details: hx of R amputation       Communication   Communication Communication: No apparent difficulties    Cognition Arousal: Alert Behavior During Therapy: WFL for tasks assessed/performed   PT - Cognitive impairments: No family/caregiver present to determine baseline, Orientation                       PT - Cognition Comments: Oriented to city & hospital but not name of place, not oriented to year but oriented to month. Following commands: Intact, Impaired Following commands impaired: Follows one step commands with increased time     Cueing Cueing Techniques: Verbal cues     General Comments General comments (skin integrity, edema, etc.): VSS, femoral lines intact at beginning & end of session    Exercises     Assessment/Plan    PT Assessment Patient needs continued PT services  PT Problem List Decreased strength;Decreased range of motion;Decreased activity tolerance;Decreased balance;Decreased mobility;Decreased knowledge of use of DME       PT Treatment Interventions DME instruction;Modalities;Gait training;Balance training;Neuromuscular re-education;Stair training;Functional mobility training;Therapeutic activities;Therapeutic exercise;Patient/family education    PT Goals (Current goals can be found in the Care Plan section)  Acute Rehab PT Goals Patient Stated Goal: get better PT Goal Formulation: With patient Time For Goal Achievement: 08/23/23 Potential to Achieve Goals: Good Additional Goals Additional Goal #1: Pt will propel w/c x 150 ft with mod I  to increase independence with mobility.    Frequency Min 2X/week     Co-evaluation               AM-PAC PT "6 Clicks" Mobility  Outcome Measure Help needed turning from your back to your side while in a flat bed without using bedrails?: A Lot Help needed moving from lying on your back to sitting on the side of a flat bed without using bedrails?: Total Help needed moving to and from a bed to a chair (including a wheelchair)?: A Lot Help needed standing up from a chair using your arms (e.g., wheelchair or bedside chair)?: Total Help needed to walk in hospital room?: Total Help needed climbing 3-5 steps with a railing? : Total 6 Click Score: 8    End of Session   Activity Tolerance: Patient tolerated treatment well Patient left: in bed;with call bell/phone within reach;with bed alarm set;with nursing/sitter in room (set up with meal tray, bed in chair position, nurse in room to assist pt with eating lunch) Nurse Communication: Mobility  status PT Visit Diagnosis: Muscle weakness (generalized) (M62.81);Other abnormalities of gait and mobility (R26.89)    Time: 1610-9604 PT Time Calculation (min) (ACUTE ONLY): 24 min   Charges:   PT Evaluation $PT Eval Moderate Complexity: 1 Mod   PT General Charges $$ ACUTE PT VISIT: 1 Visit         Emaline Handsome, PT, DPT 08/09/23, 2:50 PM   Venetta Gill 08/09/2023, 2:49 PM

## 2023-08-09 NOTE — TOC Progression Note (Signed)
 Transition of Care [facility]) - Progression Note    Patient Details  Name: Adam Howell MRN: [id number] Date of Birth: June 05, 1960  [facility] CM/SW Contact  Quinterius Gaida A Zsazsa Bahena, RN Phone Number: 08/09/2023, 10:39 AM  Clinical Narrative:    Chart reviewed.  Noted that patient was admitted for ESRD.  Patient is not extubated and on RA.  Patient's chest tube was also removed.  Patient continues to require pressor support.  Noted that patient will need a tunneled catheter sometime this week for outpatient hemodialysis.    TOC will continue to follow for discharge planning.     Expected Discharge Plan: Skilled Nursing Facility    Expected Discharge Plan and Services       Living arrangements for the past 2 months: Skilled Nursing Facility Expected Discharge Date: 08/05/23                                     Social Determinants of Health (SDOH) Interventions SDOH Screenings   Food Insecurity: Patient Unable To Answer (08/07/2023)  Housing: Patient Unable To Answer (08/07/2023)  Transportation Needs: Patient Unable To Answer (08/07/2023)  Utilities: Patient Unable To Answer (08/07/2023)  Alcohol Screen: Low Risk  (11/25/2021)  Depression (PHQ2-9): Low Risk  (04/14/2023)  Financial Resource Strain: Low Risk  (11/25/2021)  Physical Activity: Insufficiently Active (11/25/2021)  Social Connections: Moderately Isolated (12/29/2022)  Stress: No Stress Concern Present (11/25/2021)  Tobacco Use: Low Risk  (08/05/2023)    Readmission Risk Interventions    02/16/2023    6:21 PM  Readmission Risk Prevention Plan  Transportation Screening Complete  Palliative Care Screening Not Applicable  Medication Review (RN Care Manager) Complete

## 2023-08-09 NOTE — Progress Notes (Signed)
 NAME:  Adam Howell, MRN:  [id number], DOB:  1960/09/25, LOS: 4 ADMISSION DATE:  08/05/2023, CONSULTATION DATE:  08/05/2023 REFERRING MD:  Dr. Vonna Guardian, CHIEF COMPLAINT:  Shock, post-op vent managment   Brief Pt Description / Synopsis:  63 year old male with past medical history significant for ESRD on HD, HFrEF, hypertension, hyperlipidemia presenting for elective AV fistula revision.  PermCath placement complicated by innominate vein perforation with large Right Hemothorax and concern for potential anaphylactic reaction to IV contrast, and shock.  Returns to ICU postprocedure and remains intubated.  History of Present Illness:  Adam Howell is a 63 y.o male with past medical history significant for ESRD on HD, HFrEF, HTN, HLD who presented to [facility] on 08/05/23 for elective revision of his AV fistula as is was noted to be quite aneurysmal with scabs and impending skin threat putting it at high risk for bleeding.  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  The patient became hypotensive with SBP in the 60's concerning for bleeding in the diseased central venous system verses possible IV contrast reaction.  Fluroscopy did show some blood in the pleural cavity concerning for hemothorax.     Femoral temporary Dialysis catheter was placed along with femoral central line placement. Consideration was given for placing a chest tube, but it was felt this may result in increased bleeding as the pressure from the existing hematoma would help tamponade any further venous bleeding and if we remove this into the cavity further bleeding would likely ensue. Also of note, the patient had been on Eliquis  up till yesterday and had received heparin  during the revision of his fistula.     Pt remained intubated post procedure requiring vasopressors, and was transferred to ICU.  PCCM consulted for further workup and  treatment.  Please see "Significant Hospital Events" section below for full detailed hospital course.   Pertinent  Medical History   Past Medical History:  Diagnosis Date   Anemia in chronic kidney disease    Cellulitis of left lower extremity    Chronic atrial fibrillation (HCC)    Chronic kidney disease    Congestive heart failure (HCC)    Difficult intubation    End-stage renal disease on hemodialysis (HCC)    Hyperlipidemia    Hypertension    Type 2 diabetes mellitus with complication (HCC)     Micro Data:  5/22: MRSA PCR>>negative 5/24: Tracheal aspirate>> no growth to date  Antimicrobials:   Anti-infectives (From admission, onward)    Start     Dose/Rate Route Frequency Ordered Stop   08/06/23 0900  piperacillin -tazobactam (ZOSYN ) IVPB 2.25 g        2.25 g 100 mL/hr over 30 Minutes Intravenous Every 8 hours 08/06/23 0812     08/05/23 0600  ceFAZolin  (ANCEF ) IVPB 2g/100 mL premix        2 g 200 mL/hr over 30 Minutes Intravenous On call to O.R. 08/04/23 2248 08/05/23 1143       Significant Hospital Events: Including procedures, antibiotic start and stop dates in addition to other pertinent events   5/22: Presented for elective AV fistula revision.  During Permcath placement, concern for bleeding and hemothorax.  Also with concern for anaphylaxis with anaphylactic shock following IV contrast requiring Epi gtt.  Returns to ICU post procedure and remains intubated.  PCCM consulted.  Nephrology consulted for HD. 08/06/23- patient on VRE contact precautions. Hemothorax noted on  right chest.  Will give FFP and chest tube placement today.  There is some mediastinal shift and possible tension , patient requiring vasopressor.  I discussed with vascular surgeon Dr Vonna Guardian and daughter POA of patient.  We plan to place chest tube on right for hemothorax.  08/07/23- no overnight events, patient is off of vasopressors this am. 80cc serosang return from chest tube overnight but minimal  return during dayshift today.  Daughter coming to see patient today.  08/08/23- patient liberated of MV this am. Overnight chest tube with no additional drainage post clamping. Removed chest tube.  For SLP. 08/09/23- No significant events noted overnight, tolerating extubation well from respiratory standpoint. Repeat Chest X-ray without PTX or hemothorax.  Remains on low dose levophed (4 mcg).  Hgb remains stable, check cortisol and Echocardiogram.  Interim History / Subjective:  As outlined above under "Significant Hospital Events" section  Objective    Blood pressure (!) 97/57, pulse 63, temperature 98.3 F (36.8 C), resp. rate (!) 21, height 6' (1.829 m), weight 108.8 kg, SpO2 100%.        Intake/Output Summary (Last 24 hours) at 08/09/2023 0841 Last data filed at 08/09/2023 0741 Gross per 24 hour  Intake 407.31 ml  Output --  Net 407.31 ml   Filed Weights   08/05/23 2310 08/07/23 2108 08/08/23 0054  Weight: 102.1 kg 110.9 kg 108.8 kg    Examination: General: Acutely ill-appearing male, sitting in bed, no acute distress HENT: Atraumatic, normocephalic, neck supple, difficult to assess JVD due to body habitus Lungs: Clear breath sounds throughout, even, nonlabored, normal effort Cardiovascular: Irregular irregular rhythm, rate controlled, no murmurs, rubs, gallops Abdomen: Obese, soft, nontender, nondistended, no guarding or rebound tenderness, bowel sounds positive x 4 Extremities: Right BKA, chronic wounds to left shin (see images below) Neuro: Awake and alert, oriented x3, moves all extremities to commands, no focal deficits, speech clear, pupils PERRL GU: Deferred           Resolved Hospital Problem list   Anaphylaxis  Anaphylactic shock Right Hemothorax  Assessment & Plan:   #Shock: Anaphylactic vs Hemorrhagic #Mildly Elevated Troponin, suspect demand ischemia #Chronic Atrial Fibrillation #Chronic HFrEF PMHx: CHF,HTN, HLD Echocardiogram 01/10/20: LVEF  35-40%, unable to evaluate diastolic function, RV systolic function low normal, mildly elevated pulmonary artery systolic pressure -Continuous cardiac monitoring -Maintain MAP >65 -Cautious IV fluids given ESRD -Transfusions as indicated -Vasopressors as needed to maintain MAP goal  -Continue Midodrine 10 mg TID -Lactic acid has normalized -HS Troponin flat at 76 -Check Cortisol -Echocardiogram ordered and is pending -Volume removal with HD -Discussed with Dr. Darnelle Elders, will continue Hold home Eliquis /AC -Hold home Coreg  for now  #Post-Op Ventilator Management ~ EXTUBATED 5/25 #Right Hemothorax due to innominate vein perforation s/p Chest Tube placement on 5/23 (Chest tube removed 5/25) -Supplemental O2 as needed to maintain O2 sats >92% -Follow intermittent Chest X-ray & ABG as needed -Bronchodilators prn -ABX as above -Volume removal with HD -Aggressive Pulmonary toilet as able  #ESRD on HD #Mild Hyponatremia #AV Fistula revision -Monitor I&O's / urinary output -Follow BMP -Ensure adequate renal perfusion -Avoid nephrotoxic agents as able -Replace electrolytes as indicated ~ Pharmacy following for assistance with electrolyte replacement -Nephrology consulted, appreciate input ~ HD as per Nephrology -Vascular Surgery following, appreciate input ~  Left forearm aVF is thrombosed, plan for tunneled catheter sometime this week (continuing with temporary femoral HD catheter for now)  #Acute Blood Loss Anemia due to Hemothorax -Monitor for S/Sx of bleeding -Trend  CBC -SCD's for VTE Prophylaxis (hold DVT ppx/home Eliquis  for now) -Transfuse for Hgb <7  #Diabetes Mellitus -CBG's ac & at bedtime; Target range of 140 to 180 -SSI -Follow ICU Hypo/Hyperglycemia protocol       Best Practice (right click and "Reselect all SmartList Selections" daily)   Diet/type: Regular DVT prophylaxis: SCD GI prophylaxis: PPI Lines: Central line, Dialysis Catheter, and yes and it is still  needed Foley:  N/A Code Status:  full code Last date of multidisciplinary goals of care discussion [5/26]  5/26: Pt updated at bedside on plan of care.  Will update family when they arrive at bedside.  Labs   CBC: Recent Labs  Lab 08/05/23 0654 08/05/23 1305 08/05/23 2143 08/06/23 0557 08/06/23 1451 08/06/23 2104 08/07/23 0348 08/08/23 0307 08/08/23 1530  WBC 3.8* 15.0* 13.5* 14.5*  --  14.0* 14.6* 14.7*  --   NEUTROABS 1.7 12.6*  --   --   --   --   --   --   --   HGB 14.5  16.3 11.9* 11.3* 10.6* 8.8* 8.4* 8.4* 8.5* 7.7*  HCT 45.4  48.0 37.1* 35.4* 31.8* 26.7* 24.7* 24.8* 24.7* 23.0*  MCV 88.2 88.5 87.6 85.9  --  84.3 83.8 83.4  --   PLT 108* 160 151 152  --  141* 141* 131*  --     Basic Metabolic Panel: Recent Labs  Lab 08/05/23 1305 08/06/23 0549 08/07/23 0348 08/08/23 0307 08/09/23 0208  NA 133* 132* 132* 133* 132*  K 5.5* 5.5* 4.7 3.8 4.0  CL 95* 95* 94* 93* 92*  CO2 26 25 25 27 26   GLUCOSE 243* 174* 154* 135* 149*  BUN 34* 29* 46* 26* 48*  CREATININE 8.72* 6.85* 8.41* 5.26* 7.38*  CALCIUM  8.1* 8.8* 8.4* 8.4* 8.1*  MG  --   --   --  1.9 2.4  PHOS  --  3.0 3.9 4.4 4.3   GFR: Estimated Creatinine Clearance: 13.2 mL/min (A) (by C-G formula based on SCr of 7.38 mg/dL (H)). Recent Labs  Lab 08/06/23 0557 08/06/23 1056 08/06/23 1433 08/06/23 2104 08/07/23 0348 08/07/23 2110 08/08/23 0307  WBC 14.5*  --   --  14.0* 14.6*  --  14.7*  LATICACIDVEN  --  3.3* 5.2* 2.9*  --  0.8  --     Liver Function Tests: Recent Labs  Lab 08/05/23 1305 08/06/23 0549 08/07/23 0348 08/08/23 0307  AST 17  --   --   --   ALT 17  --   --   --   ALKPHOS 84  --   --   --   BILITOT 0.8  --   --   --   PROT 6.5  --   --   --   ALBUMIN  3.2* 3.4* 2.8* 2.9*   No results for input(s): "LIPASE", "AMYLASE" in the last 168 hours. No results for input(s): "AMMONIA" in the last 168 hours.  ABG    Component Value Date/Time   PHART 7.43 08/05/2023 1313   PCO2ART 40  08/05/2023 1313   PO2ART 82 (L) 08/05/2023 1313   HCO3 26.5 08/05/2023 1313   TCO2 30 08/05/2023 0654   O2SAT 98.3 08/05/2023 1313     Coagulation Profile: Recent Labs  Lab 08/05/23 1305  INR 1.3*    Cardiac Enzymes: No results for input(s): "CKTOTAL", "CKMB", "CKMBINDEX", "TROPONINI" in the last 168 hours.  HbA1C: HB A1C (BAYER DCA - WAIVED)  Date/Time Value Ref Range Status  01/12/2023 02:13  PM 5.9 (H) 4.8 - 5.6 % Final    Comment:             Prediabetes: 5.7 - 6.4          Diabetes: >6.4          Glycemic control for adults with diabetes: <7.0   10/12/2022 02:20 PM 5.9 (H) 4.8 - 5.6 % Final    Comment:             Prediabetes: 5.7 - 6.4          Diabetes: >6.4          Glycemic control for adults with diabetes: <7.0    Hgb A1c MFr Bld  Date/Time Value Ref Range Status  08/06/2023 05:49 AM 6.0 (H) 4.8 - 5.6 % Final    Comment:    (NOTE) Pre diabetes:          5.7%-6.4%  Diabetes:              >6.4%  Glycemic control for   <7.0% adults with diabetes   02/16/2023 03:54 PM 6.4 (H) 4.8 - 5.6 % Final    Comment:    (NOTE) Pre diabetes:          5.7%-6.4%  Diabetes:              >6.4%  Glycemic control for   <7.0% adults with diabetes     CBG: Recent Labs  Lab 08/08/23 1547 08/08/23 1922 08/08/23 2317 08/09/23 0339 08/09/23 0738  GLUCAP 113* 159* 122* 127* 126*    Review of Systems:   Positives in BOLD: Report pains in LLE wounds Gen: Denies fever, chills, weight change, fatigue, night sweats HEENT: Denies blurred vision, double vision, hearing loss, tinnitus, sinus congestion, rhinorrhea, sore throat, neck stiffness, dysphagia PULM: Denies shortness of breath, cough, sputum production, hemoptysis, wheezing CV: Denies chest pain, edema, orthopnea, paroxysmal nocturnal dyspnea, palpitations GI: Denies abdominal pain, nausea, vomiting, diarrhea, hematochezia, melena, constipation, change in bowel habits GU: Denies dysuria, hematuria, polyuria,  oliguria, urethral discharge Endocrine: Denies hot or cold intolerance, polyuria, polyphagia or appetite change Derm: Denies rash, dry skin, scaling or peeling skin change Heme: Denies easy bruising, bleeding, bleeding gums Neuro: Denies headache, numbness, weakness, slurred speech, loss of memory or consciousness    Past Medical History:  He,  has a past medical history of Anemia in chronic kidney disease, Cellulitis of left lower extremity, Chronic atrial fibrillation (HCC), Chronic kidney disease, Congestive heart failure (HCC), Difficult intubation, End-stage renal disease on hemodialysis (HCC), Hyperlipidemia, Hypertension, and Type 2 diabetes mellitus with complication (HCC).   Surgical History:   Past Surgical History:  Procedure Laterality Date   A/V FISTULAGRAM Left 08/27/2022   Procedure: A/V Fistulagram;  Surgeon: Celso College, MD;  Location: [facility];  Service: Cardiovascular;  Laterality: Left;   AMPUTATION Right 03/13/2022   Procedure: AMPUTATION ABOVE KNEE;  Surgeon: Celso College, MD;  Location: [facility] ORS;  Service: General;  Laterality: Right;   APPLICATION OF WOUND VAC Right 03/07/2022   Procedure: APPLICATION OF WOUND VAC;  Surgeon: Lesta Rater, MD;  Location: [facility] OR[facility];  Service: Vascular;  Laterality: Right;  GAAC [id number]   CENTRAL LINE INSERTION Left 08/05/2023   Procedure: CENTRAL LINE INSERTION;  Surgeon: Celso College, MD;  Location: [facility] OR[facility];  Service: Vascular;  Laterality: Left;   DIALYSIS/PERMA CATHETER INSERTION Right 08/05/2023   Procedure: DIALYSIS/PERMA CATHETER INSERTION;  Surgeon: Celso College, MD;  Location:  [facility] OR[facility];  Service: Vascular;  Laterality: Right;   GROIN DISSECTION Right 08/05/2023   Procedure: right groin venogram, angioplasty of superior venacava and anomic vein;  Surgeon: Celso College, MD;  Location: [facility] OR[facility];  Service: Vascular;  Laterality: Right;   LOWER EXTREMITY ANGIOGRAPHY Left 02/15/2023   Procedure: Lower Extremity  Angiography;  Surgeon: Celso College, MD;  Location: [facility];  Service: Cardiovascular;  Laterality: Left;   REVISON OF ARTERIOVENOUS FISTULA Left 08/05/2023   Procedure: REVISON OF ARTERIOVENOUS FISTULA;  Surgeon: Celso College, MD;  Location: [facility] OR[facility];  Service: Vascular;  Laterality: Left;  JUMP GRAFT   WOUND DEBRIDEMENT Left    WOUND DEBRIDEMENT Right 01/16/2022   Procedure: DEBRIDEMENT WOUND;  Surgeon: Jackquelyn Mass, MD;  Location: [facility] OR[facility];  Service: Vascular;  Laterality: Right;  Wound vac placement   WOUND DEBRIDEMENT Right 03/07/2022   Procedure: DEBRIDEMENT WOUND RIGHT LOWER EXTREMITY;  Surgeon: Lesta Rater, MD;  Location: [facility];  Service: Vascular;  Laterality: Right;     Social History:   reports that he has never smoked. He has never used smokeless tobacco. He reports that he does not currently use alcohol. He reports that he does not use drugs.   Family History:  His family history includes Cirrhosis in his father; Heart failure in his mother; Hypertension in his daughter.   Allergies Allergies  Allergen Reactions   Ivp Dye [Iodinated Contrast Media] Anaphylaxis   Tramadol  Rash     Home Medications  Prior to Admission medications   Medication Sig Start Date End Date Taking? Authorizing Provider  amiodarone  (PACERONE ) 200 MG tablet Take 1 tablet (200 mg total) by mouth 2 (two) times daily. 10/12/22  Yes Cannady, Jolene T, NP  aspirin  EC 81 MG tablet Take 1 tablet (81 mg total) by mouth daily. Swallow whole. 03/05/23  Yes Althia Atlas, MD  carvedilol  (COREG ) 6.25 MG tablet Take 6.25 mg by mouth daily. 06/25/23  Yes [provider]  ELIQUIS  2.5 MG TABS tablet Take 1 tablet (2.5 mg total) by mouth 2 (two) times daily. 10/12/22  Yes Cannady, Jolene T, NP  iron  polysaccharides (NIFEREX) 150 MG capsule Take 1 capsule (150 mg total) by mouth daily. 03/05/23  Yes Althia Atlas, MD  melatonin 3 MG TABS tablet Take 3 mg by mouth at bedtime.   Yes  [provider]  midodrine (PROAMATINE) 5 MG tablet Take 5 mg by mouth as needed (post dialysis hypotension SBP<90 or DBP<55).   Yes [provider]  rosuvastatin  (CRESTOR ) 40 MG tablet Take 1 tablet (40 mg total) by mouth daily. 10/12/22  Yes Cannady, Jolene T, NP  SANTYL  250 UNIT/GM ointment Apply 1 Application topically daily. 02/08/23  Yes [provider]  senna (SENOKOT) 8.6 MG TABS tablet Take 2 tablets (17.2 mg total) by mouth at bedtime. 04/03/22  Yes Love, Renay Carota, PA-C  sevelamer  carbonate (RENVELA ) 800 MG tablet Take 2,400 mg by mouth 3 (three) times daily. 09/21/22  Yes [provider]  traZODone  (DESYREL ) 100 MG tablet Take 1 tablet (100 mg total) by mouth at bedtime. 01/12/23  Yes Cannady, Jolene T, NP  acetaminophen  (TYLENOL ) 325 MG tablet Take 2 tablets (650 mg total) by mouth every 6 (six) hours as needed for mild pain (pain score 1-3), fever or headache (or Fever >/= 101). 03/04/23   Althia Atlas, MD  oxyCODONE -acetaminophen  (PERCOCET) 10-325 MG tablet Take 1 tablet by mouth every 6 (six) hours as needed for pain. 08/05/23  Celso College, MD     Critical care time: 40 minutes     Cherylann Corpus, AGACNP-BC [facility] Prefer epic messenger for cross cover needs If after hours, please call E-link

## 2023-08-09 NOTE — Progress Notes (Signed)
   08/09/23 2300  Vitals  Temp 98.1 F (36.7 C)  Temp Source Oral  BP 114/70  MAP (mmHg) 83  BP Location Right Arm  BP Method Automatic  Patient Position (if appropriate) Lying  Pulse Rate Source Monitor  ECG Heart Rate 71  Resp 17  Oxygen Therapy  SpO2 100 %  O2 Device Room Air  During Treatment Monitoring  Blood Flow Rate (mL/min) 0 mL/min  Arterial Pressure (mmHg) -89.89 mmHg  Venous Pressure (mmHg) 160.39 mmHg  TMP (mmHg) 24.64 mmHg  Ultrafiltration Rate (mL/min) 907 mL/min  Dialysate Flow Rate (mL/min) 299 ml/min  Dialysate Potassium Concentration 3  Dialysate Calcium  Concentration 2.5  Duration of HD Treatment -hour(s) 3.5 hour(s)  Cumulative Fluid Removed (mL) per Treatment  2050.15  Intra-Hemodialysis Comments Tx completed  Post Treatment  Dialyzer Clearance Lightly streaked  Liters Processed 82.8  Fluid Removed (mL) 2100 mL  Tolerated HD Treatment Yes  Fistula / Graft Left Forearm Arteriovenous fistula  No placement date or time found.   Placed prior to admission: Yes  Orientation: Left  Access Location: Forearm  Access Type: Arteriovenous fistula  Site Condition No complications  Hemodialysis Catheter Right Femoral vein  Placement Date/Time: 08/05/23 1142   Placed prior to admission: No  Serial / Lot #: [id number]  Time Out: Correct patient;Correct site;Correct procedure  Maximum sterile barrier precautions: Hand hygiene;Cap;Mask;Sterile gown;Sterile gloves;Large ster...  Site Condition No complications  Blue Lumen Status Flushed;Heparin  locked  Red Lumen Status Flushed;Heparin  locked  Catheter fill solution Heparin  1000 units/ml  Catheter fill volume (Arterial) 2.1 cc  Catheter fill volume (Venous) 2.1  Dressing Type Transparent  Dressing Status Antimicrobial disc/dressing in place  Interventions New dressing  Drainage Description None  Dressing Change Due 08/15/23  Post treatment catheter status Capped and Clamped

## 2023-08-10 ENCOUNTER — Inpatient Hospital Stay

## 2023-08-10 DIAGNOSIS — N186 End stage renal disease: Secondary | ICD-10-CM | POA: Diagnosis not present

## 2023-08-10 LAB — CBC
HCT: 27.5 % — ABNORMAL LOW (ref 39.0–52.0)
Hemoglobin: 8.9 g/dL — ABNORMAL LOW (ref 13.0–17.0)
MCH: 28.3 pg (ref 26.0–34.0)
MCHC: 32.4 g/dL (ref 30.0–36.0)
MCV: 87.6 fL (ref 80.0–100.0)
Platelets: 125 10*3/uL — ABNORMAL LOW (ref 150–400)
RBC: 3.14 MIL/uL — ABNORMAL LOW (ref 4.22–5.81)
RDW: 15.7 % — ABNORMAL HIGH (ref 11.5–15.5)
WBC: 10.5 10*3/uL (ref 4.0–10.5)
nRBC: 0.7 % — ABNORMAL HIGH (ref 0.0–0.2)

## 2023-08-10 LAB — GLUCOSE, CAPILLARY
Glucose-Capillary: 127 mg/dL — ABNORMAL HIGH (ref 70–99)
Glucose-Capillary: 139 mg/dL — ABNORMAL HIGH (ref 70–99)
Glucose-Capillary: 167 mg/dL — ABNORMAL HIGH (ref 70–99)
Glucose-Capillary: 188 mg/dL — ABNORMAL HIGH (ref 70–99)
Glucose-Capillary: 190 mg/dL — ABNORMAL HIGH (ref 70–99)
Glucose-Capillary: 227 mg/dL — ABNORMAL HIGH (ref 70–99)
Glucose-Capillary: 91 mg/dL (ref 70–99)

## 2023-08-10 LAB — RENAL FUNCTION PANEL
Albumin: 2.8 g/dL — ABNORMAL LOW (ref 3.5–5.0)
Anion gap: 12 (ref 5–15)
BUN: 35 mg/dL — ABNORMAL HIGH (ref 8–23)
CO2: 28 mmol/L (ref 22–32)
Calcium: 8.3 mg/dL — ABNORMAL LOW (ref 8.9–10.3)
Chloride: 95 mmol/L — ABNORMAL LOW (ref 98–111)
Creatinine, Ser: 5.23 mg/dL — ABNORMAL HIGH (ref 0.61–1.24)
GFR, Estimated: 12 mL/min — ABNORMAL LOW (ref >60)
Glucose, Bld: 144 mg/dL — ABNORMAL HIGH (ref 70–99)
Phosphorus: 2.8 mg/dL (ref 2.5–4.6)
Potassium: 3.7 mmol/L (ref 3.5–5.1)
Sodium: 135 mmol/L (ref 135–145)

## 2023-08-10 LAB — CULTURE, RESPIRATORY W GRAM STAIN: Culture: NO GROWTH

## 2023-08-10 MED ORDER — MELATONIN 5 MG PO TABS
5.0000 mg | ORAL_TABLET | Freq: Every evening | ORAL | Status: DC | PRN
  Administered 2023-08-11 – 2023-08-14 (×3): 5 mg via ORAL
  Filled 2023-08-10 (×3): qty 1

## 2023-08-10 MED ORDER — HYDROCORTISONE SOD SUC (PF) 100 MG IJ SOLR
50.0000 mg | Freq: Two times a day (BID) | INTRAMUSCULAR | Status: DC
  Administered 2023-08-10 – 2023-08-12 (×5): 50 mg via INTRAVENOUS
  Filled 2023-08-10 (×7): qty 1

## 2023-08-10 MED ORDER — POLYETHYLENE GLYCOL 3350 17 G PO PACK
17.0000 g | PACK | Freq: Every day | ORAL | Status: DC
  Administered 2023-08-10: 17 g via ORAL
  Filled 2023-08-10 (×5): qty 1

## 2023-08-10 NOTE — Progress Notes (Signed)
 Progress Note    08/10/2023 10:02 AM 5 Days Post-Op  Subjective:  Patient is resting comfortably in ICU this morning eating breakfast. No complaints overnight. Patient remains on blood pressure support medication at this time. Patient endorses he is scheduled for dialysis later today as he is on a Monday Wednesday Friday schedule. Patient has Perma Catheter dialysis catheter in his right groin.    Vitals:   08/10/23 0500 08/10/23 0600  BP: (!) 137/95 109/60  Pulse:    Resp: 18 (!) 24  Temp:    SpO2:     Physical Exam: Cardiac:  RRR, normal S1-S2, no rubs clicks gallops or murmurs. Lungs: Lungs are rhonchorous on auscultation throughout.  Unable to hear breath sounds in the right lower and right middle lobe.  Left lower lobe able to auscultate breath sounds and moving air.  Patient remains ventilated Incisions: Bilateral groin incisions for dialysis catheter placement triple-lumen placement.  In right jugular vein incision for permacath placement that was removed. Extremities: Unable to palpate pulses throughout due to blood pressure support medication.  All extremities cool to touch this morning. Abdomen: Bowel sounds throughout on auscultation, soft, nontender and nondistended. Neurologic: Patient is sedated and ventilated at this time  CBC    Component Value Date/Time   WBC 10.5 08/10/2023 0354   RBC 3.14 (L) 08/10/2023 0354   HGB 8.9 (L) 08/10/2023 0354   HGB 16.8 01/12/2023 1414   HCT 27.5 (L) 08/10/2023 0354   HCT 51.0 01/12/2023 1414   PLT 125 (L) 08/10/2023 0354   PLT 257 01/12/2023 1414   MCV 87.6 08/10/2023 0354   MCV 88 01/12/2023 1414   MCH 28.3 08/10/2023 0354   MCHC 32.4 08/10/2023 0354   RDW 15.7 (H) 08/10/2023 0354   RDW 14.8 01/12/2023 1414   LYMPHSABS 1.6 08/05/2023 1305   LYMPHSABS 1.0 01/12/2023 1414   MONOABS 0.4 08/05/2023 1305   EOSABS 0.1 08/05/2023 1305   EOSABS 0.2 01/12/2023 1414   BASOSABS 0.1 08/05/2023 1305   BASOSABS 0.1 01/12/2023 1414     BMET    Component Value Date/Time   NA 135 08/10/2023 0354   NA 136 04/14/2023 1610   K 3.7 08/10/2023 0354   CL 95 (L) 08/10/2023 0354   CO2 28 08/10/2023 0354   GLUCOSE 144 (H) 08/10/2023 0354   BUN 35 (H) 08/10/2023 0354   BUN 18 04/14/2023 1610   CREATININE 5.23 (H) 08/10/2023 0354   CALCIUM  8.3 (L) 08/10/2023 0354   GFRNONAA 12 (L) 08/10/2023 0354    INR    Component Value Date/Time   INR 1.3 (H) 08/05/2023 1305     Intake/Output Summary (Last 24 hours) at 08/10/2023 1002 Last data filed at 08/10/2023 0300 Gross per 24 hour  Intake 581.03 ml  Output 2100 ml  Net -1518.97 ml     Assessment/Plan:  63 y.o. male is s/p RIJ HD catheter complicated by right hemothorax with chest tube  5 Days Post-Op   PLAN Chest tube is now been removed.  Patient remains extubated and breathing fine with no respiratory issues.  [name] and awake this morning and off blood pressure support medication. Left forearm aVF is thrombosed. Correction from yesterdays note he will NOT need a tunneled catheter sometime this week for outpatient hemodialysis. The catheter he has in his groin is a PERM Cath that can be used for outpatient hemodialysis.    DVT prophylaxis: Heparin  with dialysis   Annamaria Barrette [facility] 08/10/2023  10:02 AM

## 2023-08-10 NOTE — Progress Notes (Signed)
 [facility]  ROUNDING NOTE   Subjective:   Adam Howell is a 63 y.o male with past medical history of diabetes, hypertension, CAD, Rt AKA, anemia, CHF, and ESRD on dialysis. Patient presents to vascular surgery for revision of AVF. He is being admitted for End stage renal disease (HCC) [N18.6] ESRD (end stage renal disease) (HCC) [N18.6]  Patient had anaphylactic shock prior to his procedure.   Patient had a right femoral temp HD catheter placed and receiving dialysis.   Update:   Patient completed dialysis treatment late yesterday. UF achieved was 2 kg.   Objective:  Vital signs in last 24 hours:  Temp:  [98.1 F (36.7 C)-98.5 F (36.9 C)] 98.3 F (36.8 C) (05/27 0400) Pulse Rate:  [58-70] 69 (05/26 2245) Resp:  [0-27] 24 (05/27 0600) BP: (79-137)/(42-95) 109/60 (05/27 0600) SpO2:  [98 %-100 %] 100 % (05/26 2300) Weight:  [101.2 kg] 101.2 kg (05/26 1920)  Weight change:  Filed Weights   08/07/23 2108 08/08/23 0054 08/09/23 1920  Weight: 110.9 kg 108.8 kg 101.2 kg    Intake/Output: I/O last 3 completed shifts: In: 788.3 [I.V.:308.3; NG/GT:237; IV Piggyback:243] Out: 2100 [Other:2100]   Intake/Output this shift:  No intake/output data recorded.  Physical Exam: General: No acute distress  Head: [place]/AT, hearing intact  Eyes: Anicteric  Lungs:  Clear bilateral  Heart: Regular rate and rhythm  Abdomen:  Soft, nontender, obese  Extremities: No peripheral edema  Right AKA  Neurologic: Alert and oriented  Skin: No lesions  Access: Left aVF (no bruit or thrill) right HD femoral temp cath    Basic Metabolic Panel: Recent Labs  Lab 08/06/23 0549 08/07/23 0348 08/08/23 0307 08/09/23 0208 08/10/23 0354  NA 132* 132* 133* 132* 135  K 5.5* 4.7 3.8 4.0 3.7  CL 95* 94* 93* 92* 95*  CO2 25 25 27 26 28   GLUCOSE 174* 154* 135* 149* 144*  BUN 29* 46* 26* 48* 35*  CREATININE 6.85* 8.41* 5.26* 7.38* 5.23*  CALCIUM  8.8* 8.4* 8.4* 8.1* 8.3*  MG  --    --  1.9 2.4  --   PHOS 3.0 3.9 4.4 4.3 2.8    Liver Function Tests: Recent Labs  Lab 08/05/23 1305 08/06/23 0549 08/07/23 0348 08/08/23 0307 08/10/23 0354  AST 17  --   --   --   --   ALT 17  --   --   --   --   ALKPHOS 84  --   --   --   --   BILITOT 0.8  --   --   --   --   PROT 6.5  --   --   --   --   ALBUMIN  3.2* 3.4* 2.8* 2.9* 2.8*   No results for input(s): "LIPASE", "AMYLASE" in the last 168 hours. No results for input(s): "AMMONIA" in the last 168 hours.  CBC: Recent Labs  Lab 08/05/23 0654 08/05/23 1305 08/05/23 2143 08/06/23 2104 08/07/23 0348 08/08/23 0307 08/08/23 1530 08/09/23 0850 08/10/23 0354  WBC 3.8* 15.0*   < > 14.0* 14.6* 14.7*  --  12.9* 10.5  NEUTROABS 1.7 12.6*  --   --   --   --   --   --   --   HGB 14.5  16.3 11.9*   < > 8.4* 8.4* 8.5* 7.7* 8.0* 8.9*  HCT 45.4  48.0 37.1*   < > 24.7* 24.8* 24.7* 23.0* 24.5* 27.5*  MCV 88.2 88.5   < >  84.3 83.8 83.4  --  85.7 87.6  PLT 108* 160   < > 141* 141* 131*  --  124* 125*   < > = values in this interval not displayed.    Cardiac Enzymes: No results for input(s): "CKTOTAL", "CKMB", "CKMBINDEX", "TROPONINI" in the last 168 hours.  BNP: Invalid input(s): "POCBNP"  CBG: Recent Labs  Lab 08/09/23 0738 08/09/23 1136 08/09/23 1636 08/09/23 1950 08/09/23 2339  GLUCAP 126* 169* 222* 140* 117*    Microbiology: Results for orders placed or performed during the hospital encounter of 08/05/23  Culture, Respiratory w Gram Stain     Status: None (Preliminary result)   Collection Time: 08/07/23  8:09 AM   Specimen: Tracheal Aspirate; Respiratory  Result Value Ref Range Status   Specimen Description   Final    TRACHEAL ASPIRATE Performed at [facility], [address] [place], [place] [address]    Special Requests   Final    NONE Performed at [facility], [address] [place], [place] [address]    Gram Stain   Final    FEW WBC PRESENT, PREDOMINANTLY MONONUCLEAR FEW  GRAM POSITIVE COCCI IN CLUSTERS    Culture   Final    FEW STAPHYLOCOCCUS AUREUS SUSCEPTIBILITIES TO FOLLOW Performed at [facility], [address] [place], [place] [address]    Report Status PENDING  Incomplete    Coagulation Studies: No results for input(s): "LABPROT", "INR" in the last 72 hours.   Urinalysis: No results for input(s): "COLORURINE", "LABSPEC", "PHURINE", "GLUCOSEU", "HGBUR", "BILIRUBINUR", "KETONESUR", "PROTEINUR", "UROBILINOGEN", "NITRITE", "LEUKOCYTESUR" in the last 72 hours.  Invalid input(s): "APPERANCEUR"    Imaging: DG Chest Port 1 View Result Date: 08/10/2023 CLINICAL DATA:  Pleural effusion. EXAM: PORTABLE CHEST 1 VIEW COMPARISON:  08/09/2023 FINDINGS: The cardio pericardial silhouette is enlarged. Basilar atelectasis bilaterally with improved aeration in the retrocardiac left base compared to prior. No acute bony abnormality. Telemetry leads overlie the chest. Trace soft tissue gas seen in the right supraclavicular region previously has resolved. IMPRESSION: Basilar atelectasis bilaterally with improved aeration in the retrocardiac left base compared to prior. Electronically Signed   By: Donnal Fusi M.D.   On: 08/10/2023 07:34   ECHOCARDIOGRAM COMPLETE Result Date: 08/09/2023    ECHOCARDIOGRAM REPORT   Patient Name:   Adam Howell Date of Exam: 08/09/2023 Medical Rec #:  [id number]           Height:       72.0 in Accession #:    [id number]          Weight:       239.9 lb Date of Birth:  Oct 18, 1960           BSA:          2.302 m Patient Age:    63 years            BP:           95/54 mmHg Patient Gender: M                   HR:           57 bpm. Exam Location:  [facility] Procedure: 2D Echo, Color Doppler and Cardiac Doppler (Both Spectral and Color            Flow Doppler were utilized during procedure). Indications:     R57.9 Shock  History:         Patient has prior history of Echocardiogram  examinations. ESRD,                  Arrythmias:Atrial  Fibrillation; Risk Factors:Diabetes,                  Hypertension and Dyslipidemia.  Sonographer:     L. Thornton-Maynard Referring Phys:  [id number] Delanna Fears Diagnosing Phys: Jackquelyn Mass MD IMPRESSIONS  1. Left ventricular ejection fraction, by estimation, is 55 to 60%. The left ventricle has normal function. The left ventricle has no regional wall motion abnormalities. There is severe concentric left ventricular hypertrophy. Left ventricular diastolic  function could not be evaluated.  2. Right ventricular systolic function is moderately reduced. The right ventricular size is moderately enlarged. There is mildly elevated pulmonary artery systolic pressure. The estimated right ventricular systolic pressure is 41.4 mmHg.  3. Left atrial size was severely dilated.  4. Right atrial size was moderately dilated.  5. The mitral valve is grossly normal. Trivial mitral valve regurgitation. No evidence of mitral stenosis.  6. The aortic valve is tricuspid. Aortic valve regurgitation is not visualized. Aortic valve sclerosis/calcification is present, without any evidence of aortic stenosis.  7. The inferior vena cava is normal in size with greater than 50% respiratory variability, suggesting right atrial pressure of 3 mmHg. Comparison(s): Changes from prior study are noted. Severe concentric LVH up to 2.0 cm. LVEF is normal and improved from prior. Moderate RV dilation with moderately reduced function. Severe LVH may be related to hypertensive heart disease per chart review, but would consider work-up for infiltrative cardiomyopathy such as cardiac amyloidosis. FINDINGS  Left Ventricle: Left ventricular ejection fraction, by estimation, is 55 to 60%. The left ventricle has normal function. The left ventricle has no regional wall motion abnormalities. The left ventricular internal cavity size was normal in size. There is  severe concentric left ventricular hypertrophy. Left ventricular diastolic function could not be  evaluated due to atrial fibrillation. Left ventricular diastolic function could not be evaluated. Right Ventricle: The right ventricular size is moderately enlarged. No increase in right ventricular wall thickness. Right ventricular systolic function is moderately reduced. There is mildly elevated pulmonary artery systolic pressure. The tricuspid regurgitant velocity is 3.10 m/s, and with an assumed right atrial pressure of 3 mmHg, the estimated right ventricular systolic pressure is 41.4 mmHg. Left Atrium: Left atrial size was severely dilated. Right Atrium: Right atrial size was moderately dilated. Pericardium: Trivial pericardial effusion is present. Mitral Valve: The mitral valve is grossly normal. Trivial mitral valve regurgitation. No evidence of mitral valve stenosis. Tricuspid Valve: The tricuspid valve is grossly normal. Tricuspid valve regurgitation is mild . No evidence of tricuspid stenosis. Aortic Valve: The aortic valve is tricuspid. Aortic valve regurgitation is not visualized. Aortic valve sclerosis/calcification is present, without any evidence of aortic stenosis. Aortic valve mean gradient measures 4.7 mmHg. Aortic valve peak gradient measures 9.7 mmHg. Aortic valve area, by VTI measures 1.92 cm. Pulmonic Valve: The pulmonic valve was grossly normal. Pulmonic valve regurgitation is not visualized. No evidence of pulmonic stenosis. Aorta: The aortic root and ascending aorta are structurally normal, with no evidence of dilitation. Venous: The inferior vena cava is normal in size with greater than 50% respiratory variability, suggesting right atrial pressure of 3 mmHg. IAS/Shunts: The atrial septum is grossly normal.  LEFT VENTRICLE PLAX 2D LVIDd:         4.00 cm     Diastology LVIDs:         3.00 cm  LV e' medial:    5.19 cm/s LV PW:         2.00 cm     LV E/e' medial:  13.0 LV IVS:        2.00 cm     LV e' lateral:   5.43 cm/s LVOT diam:     2.20 cm     LV E/e' lateral: 12.4 LV SV:         41 LV  SV Index:   18 LVOT Area:     3.80 cm  LV Volumes (MOD) LV vol d, MOD A2C: 57.9 ml LV vol d, MOD A4C: 62.5 ml LV vol s, MOD A2C: 21.8 ml LV vol s, MOD A4C: 19.0 ml LV SV MOD A2C:     36.1 ml LV SV MOD A4C:     62.5 ml LV SV MOD BP:      39.9 ml RIGHT VENTRICLE             IVC RV Basal diam:  4.00 cm     IVC diam: 1.30 cm RV S prime:     10.60 cm/s TAPSE (M-mode): 1.0 cm LEFT ATRIUM              Index        RIGHT ATRIUM           Index LA diam:        4.80 cm  2.09 cm/m   RA Area:     25.80 cm LA Vol (A2C):   147.0 ml 63.87 ml/m  RA Volume:   87.30 ml  37.93 ml/m LA Vol (A4C):   113.0 ml 49.10 ml/m LA Biplane Vol: 131.0 ml 56.92 ml/m  AORTIC VALVE                    PULMONIC VALVE AV Area (Vmax):    1.95 cm     PV Vmax:       1.19 m/s AV Area (Vmean):   2.03 cm     PV Peak grad:  5.7 mmHg AV Area (VTI):     1.92 cm AV Vmax:           155.33 cm/s AV Vmean:          97.967 cm/s AV VTI:            0.214 m AV Peak Grad:      9.7 mmHg AV Mean Grad:      4.7 mmHg LVOT Vmax:         79.80 cm/s LVOT Vmean:        52.300 cm/s LVOT VTI:          0.108 m LVOT/AV VTI ratio: 0.50  AORTA Ao Root diam: 3.40 cm Ao Asc diam:  3.20 cm MITRAL VALVE               TRICUSPID VALVE MV Area (PHT): 3.06 cm    TR Peak grad:   38.4 mmHg MV Decel Time: 248 msec    TR Vmax:        310.00 cm/s MV E velocity: 67.30 cm/s                            SHUNTS                            Systemic VTI:  0.11 m  Systemic Diam: 2.20 cm Jackquelyn Mass MD Electronically signed by Jackquelyn Mass MD Signature Date/Time: 08/09/2023/2:02:31 PM    Final    DG Chest Port 1 View Result Date: 08/09/2023 CLINICAL DATA:  End-stage renal disease. EXAM: PORTABLE CHEST 1 VIEW COMPARISON:  08/08/2023 FINDINGS: The cardio pericardial silhouette is enlarged. Left base collapse/consolidation appears progressive in the interval. No overt pulmonary edema or substantial pleural effusion. No acute bony abnormality. Telemetry leads overlie the  chest. Trace soft tissue gas again seen in the right supraclavicular region. IMPRESSION: Progressive left base collapse/consolidation. No evidence for pneumothorax with persistent minimal soft tissue gas in the right supraclavicular region. Electronically Signed   By: Donnal Fusi M.D.   On: 08/09/2023 09:16   DG Chest Port 1 View Result Date: 08/08/2023 CLINICAL DATA:  Chest tube removal. EXAM: PORTABLE CHEST 1 VIEW COMPARISON:  08/08/2023 at 5:53 a.m. FINDINGS: Interval removal of nasogastric tube, enteric tube and right-sided chest tube. A catheter remains in place with tip projecting over the right atrium unchanged. Lungs are adequately inflated and otherwise clear. No evidence of right-sided pneumothorax. Cardiomediastinal silhouette and remainder of the exam is unchanged. IMPRESSION: Interval removal of right-sided chest tube without evidence of right-sided pneumothorax. Electronically Signed   By: Roda Cirri M.D.   On: 08/08/2023 11:14     Medications:    piperacillin -tazobactam (ZOSYN )  IV 2.25 g (08/10/23 0508)    amiodarone   200 mg Oral Daily   vitamin C  500 mg Oral BID   Chlorhexidine  Gluconate Cloth  6 each Topical Q0600   docusate sodium   100 mg Oral BID   epoetin  alfa-epbx (RETACRIT ) injection  10,000 Units Subcutaneous Q M,W,F-1800   feeding supplement (NEPRO CARB STEADY)  237 mL Oral TID BM   hydrocerin   Topical Daily   hydrocortisone sod succinate (SOLU-CORTEF) inj  50 mg Intravenous Q6H   insulin  aspart  0-9 Units Subcutaneous Q4H   iron  polysaccharides  150 mg Oral Daily   ketotifen  1 drop Both Eyes BID   lidocaine   5 mL Intradermal Once   midodrine  10 mg Oral TID   multivitamin  1 tablet Oral QHS   polyethylene glycol  17 g Oral Daily   rosuvastatin   40 mg Oral Daily   zinc sulfate (50mg  elemental zinc)  220 mg Oral Daily   alteplase , alum & mag hydroxide-simeth, guaiFENesin -dextromethorphan, heparin , ondansetron , mouth rinse, oxyCODONE -acetaminophen , phenol,  potassium chloride  Assessment/ Plan:  [name] is a 63 y.o.  male with end stage renal disease on hemodialysis, diabetes, hypertension, CAD, Rt AKA, anemia, CHF who was admitted to [facility] on 08/05/2023 for revision of AVF. Patient had anaphylactic reaction and admitted to ICU.   [facility]/MWF/left AVF  End-stage renal disease with hyperkalemia on hemodialysis.  With complication of hemodialysis access.  - appreciate vascular input. Will need to assess to see if his AVF can be salvaged. If not, patient will need a tunneled catheter. - Patient completed dialysis treatment late yesterday.  UF achieved was 2 kg.  No acute indication for dialysis treatment today.  He will need PermCath if his AV fistula cannot be salvaged.  2. Anemia of chronic kidney disease Lab Results  Component Value Date   HGB 8.9 (L) 08/10/2023    Continue Epogen  10,000 units IV with dialysis treatments.  3. Secondary Hyperparathyroidism: with outpatient labs: PTH 410, phosphorus 4.7, calcium  8.8 on 07/19/23.    Lab Results  Component Value Date   CALCIUM  8.3 (  L) 08/10/2023   CAION 1.06 (L) 08/05/2023   PHOS 2.8 08/10/2023    Phosphorus currently at target at 2.8.  Currently off Renvela .  4. Hypotension: with recent systemic shock.   - Now off vasopressors.   LOS: 5 Tyiesha Brackney 5/27/20257:39 AM

## 2023-08-10 NOTE — Care Management Important Message (Signed)
 Important Message  Patient Details  Name: Adam Howell MRN: [id number] Date of Birth: September 10, 1960   Important Message Given:  Yes - Medicare IM     Anise Kerns 08/10/2023, 4:18 PM

## 2023-08-10 NOTE — Evaluation (Signed)
 Occupational Therapy Evaluation Patient Details Name: Adam Howell MRN: [id number] DOB: 1960-11-06 Today's Date: 08/10/2023   History of Present Illness   Pt is a 63 y/o M admitted on 08/05/23 after presenting for elective AV fistula revision. PermCath placement was complicated by innominate vein perforation with large R hemothorax & concern for potential anaphylactic reaction to IV contrast & shock. PMH: ESRD on HD, HFrEF, HTN, HLD     Clinical Impressions Pt was seen for OT evaluation this date with PT joining in for co-treatment. PTA, pt reports being a resident at [facility] where he was MOD I for bed mobility and lateral scoot/squat pivot transfers to his W/C. He was able to self propel his W/C and feed himself. Reports assist for bathing and dressing tasks.   Pt presents to acute OT demonstrating impaired ADL performance and functional mobility 2/2 weakness, balance deficits and low activity tolerance. Pt currently requires Min A to roll for peri-care after large BM on bed pan. Total assist at bed level for hygiene. He needed Mod A for supine to sit at EOB, with increased time and assist for trunkal elevation. CGA for seated balance at EOB then progressed to lateral scoot and squat pivot transfer to his W/C then from W/C back to new bed with CGA x2 as pt was to be moved to the floor from ICU. Assisted to remove W/C arm and leg rests prior to transfers. Returned to supine with CGA. BUE weakness remains, but pt reports it is improved since yesterday although his bil shoulder ROM remains limited. Pt would benefit from skilled OT services to address noted impairments and functional limitations to maximize safety and independence while minimizing falls risk and caregiver burden. Do anticipate the need for follow up OT services upon acute hospital DC.      If plan is discharge home, recommend the following:   A little help with walking and/or transfers;A lot of help with  bathing/dressing/bathroom     Functional Status Assessment   Patient has had a recent decline in their functional status and demonstrates the ability to make significant improvements in function in a reasonable and predictable amount of time.     Equipment Recommendations   Other (comment) (defer)     Recommendations for Other Services         Precautions/Restrictions   Precautions Precautions: Fall Recall of Precautions/Restrictions: Intact Precaution/Restrictions Comments: temp femoral HD cath Restrictions Weight Bearing Restrictions Per Provider Order: No     Mobility Bed Mobility Overal bed mobility: Needs Assistance Bed Mobility: Supine to Sit, Sit to Supine, Rolling Rolling: Min assist, Used rails   Supine to sit: Mod assist Sit to supine: Contact guard assist, HOB elevated   General bed mobility comments: trunkal assist to reach upright sitting at EOB, pt not in optimal starting position after rolling for BM peri-care; CGA to return to supine    Transfers Overall transfer level: Needs assistance Equipment used: None Transfers: Bed to chair/wheelchair/BSC     Squat pivot transfers: Contact guard assist, +2 safety/equipment       General transfer comment: pt demo lateral scoots progressing to squat pivot to transfer from bed<>W/C to get into new bed to be moved to the floor from ICU; CGA x2 provided for safety and movement of arm rest and leg rests      Balance Overall balance assessment: Needs assistance Sitting-balance support: Feet supported, Bilateral upper extremity supported Sitting balance-Leahy Scale: Fair Sitting balance - Comments: CGA/SBA seated EOB  ADL either performed or assessed with clinical judgement   ADL Overall ADL's : Needs assistance/impaired                     Lower Body Dressing: Maximal assistance;Bed level Lower Body Dressing Details (indicate cue type and  reason): to don bil socks     Toileting- Clothing Manipulation and Hygiene: Total assistance;Bed level Toileting - Clothing Manipulation Details (indicate cue type and reason): after large BM in bed             Vision         Perception         Praxis         Pertinent Vitals/Pain Pain Assessment Pain Assessment: No/denies pain Pain Intervention(s): Monitored during session, Repositioned     Extremity/Trunk Assessment Upper Extremity Assessment Upper Extremity Assessment: Generalized weakness;Right hand dominant (reports weakness has improved since yesterday, but remains with 3+/5 weakness and less than full shoulder ROM)   Lower Extremity Assessment Lower Extremity Assessment: RLE deficits/detail;Generalized weakness RLE Deficits / Details: hx of R amputation       Communication Communication Communication: No apparent difficulties   Cognition Arousal: Alert Behavior During Therapy: WFL for tasks assessed/performed                                 Following commands: Intact, Impaired Following commands impaired: Follows one step commands with increased time     Cueing  General Comments   Cueing Techniques: Verbal cues      Exercises Other Exercises Other Exercises: Edu on role of OT in acute setting.   Shoulder Instructions      Home Living Family/patient expects to be discharged to:: Skilled nursing facility                                 Additional Comments: Pt reports he lives at nursing home, unable to recall name but gives address (PT googles it & [facility]'s [facility]).      Prior Functioning/Environment               Mobility Comments: Pt reports he is able to complete bed mobility without assistance from hospital bed, performs lateral scoots/SPT bed<>w/c with mod I, propels w/c with BUE & mod I. ADLs Comments: Staff assists with bathing/dressing.    OT Problem List: Decreased  strength;Decreased activity tolerance;Decreased range of motion;Impaired balance (sitting and/or standing)   OT Treatment/Interventions: Self-care/ADL training;Therapeutic exercise;Therapeutic activities;DME and/or AE instruction;Patient/family education;Balance training      OT Goals(Current goals can be found in the care plan section)   Acute Rehab OT Goals Patient Stated Goal: improve strength OT Goal Formulation: With patient Time For Goal Achievement: 08/24/23 Potential to Achieve Goals: Good ADL Goals Pt Will Perform Grooming: with set-up;sitting Pt Will Perform Upper Body Dressing: with supervision;sitting Pt Will Transfer to Toilet: with supervision;bedside commode;squat pivot transfer   OT Frequency:  Min 2X/week    Co-evaluation              AM-PAC OT "6 Clicks" Daily Activity     Outcome Measure Help from another person eating meals?: None Help from another person taking care of personal grooming?: A Little Help from another person toileting, which includes using toliet, bedpan, or urinal?: A Lot Help from another person bathing (including washing, rinsing, drying)?:  A Lot Help from another person to put on and taking off regular upper body clothing?: A Little Help from another person to put on and taking off regular lower body clothing?: A Lot 6 Click Score: 16   End of Session Equipment Utilized During Treatment:  (wheelchair) Nurse Communication: Mobility status  Activity Tolerance: Patient tolerated treatment well Patient left: in bed;with call bell/phone within reach;with bed alarm set  OT Visit Diagnosis: Other abnormalities of gait and mobility (R26.89);Muscle weakness (generalized) (M62.81)                Time: 1610-9604 OT Time Calculation (min): 25 min Charges:  OT General Charges $OT Visit: 1 Visit OT Evaluation $OT Eval Moderate Complexity: 1 Mod Cochise Dinneen, OTR/L  08/10/23, 1:18 PM  Lavera Vandermeer E Roald Lukacs 08/10/2023, 1:15 PM

## 2023-08-10 NOTE — H&P (View-Only) (Signed)
 Progress Note    08/10/2023 10:02 AM 5 Days Post-Op  Subjective:  Patient is resting comfortably in ICU this morning eating breakfast. No complaints overnight. Patient remains on blood pressure support medication at this time. Patient endorses he is scheduled for dialysis later today as he is on a Monday Wednesday Friday schedule. Patient has Perma Catheter dialysis catheter in his right groin.    Vitals:   08/10/23 0500 08/10/23 0600  BP: (!) 137/95 109/60  Pulse:    Resp: 18 (!) 24  Temp:    SpO2:     Physical Exam: Cardiac:  RRR, normal S1-S2, no rubs clicks gallops or murmurs. Lungs: Lungs are rhonchorous on auscultation throughout.  Unable to hear breath sounds in the right lower and right middle lobe.  Left lower lobe able to auscultate breath sounds and moving air.  Patient remains ventilated Incisions: Bilateral groin incisions for dialysis catheter placement triple-lumen placement.  In right jugular vein incision for permacath placement that was removed. Extremities: Unable to palpate pulses throughout due to blood pressure support medication.  All extremities cool to touch this morning. Abdomen: Bowel sounds throughout on auscultation, soft, nontender and nondistended. Neurologic: Patient is sedated and ventilated at this time  CBC    Component Value Date/Time   WBC 10.5 08/10/2023 0354   RBC 3.14 (L) 08/10/2023 0354   HGB 8.9 (L) 08/10/2023 0354   HGB 16.8 01/12/2023 1414   HCT 27.5 (L) 08/10/2023 0354   HCT 51.0 01/12/2023 1414   PLT 125 (L) 08/10/2023 0354   PLT 257 01/12/2023 1414   MCV 87.6 08/10/2023 0354   MCV 88 01/12/2023 1414   MCH 28.3 08/10/2023 0354   MCHC 32.4 08/10/2023 0354   RDW 15.7 (H) 08/10/2023 0354   RDW 14.8 01/12/2023 1414   LYMPHSABS 1.6 08/05/2023 1305   LYMPHSABS 1.0 01/12/2023 1414   MONOABS 0.4 08/05/2023 1305   EOSABS 0.1 08/05/2023 1305   EOSABS 0.2 01/12/2023 1414   BASOSABS 0.1 08/05/2023 1305   BASOSABS 0.1 01/12/2023 1414     BMET    Component Value Date/Time   NA 135 08/10/2023 0354   NA 136 04/14/2023 1610   K 3.7 08/10/2023 0354   CL 95 (L) 08/10/2023 0354   CO2 28 08/10/2023 0354   GLUCOSE 144 (H) 08/10/2023 0354   BUN 35 (H) 08/10/2023 0354   BUN 18 04/14/2023 1610   CREATININE 5.23 (H) 08/10/2023 0354   CALCIUM  8.3 (L) 08/10/2023 0354   GFRNONAA 12 (L) 08/10/2023 0354    INR    Component Value Date/Time   INR 1.3 (H) 08/05/2023 1305     Intake/Output Summary (Last 24 hours) at 08/10/2023 1002 Last data filed at 08/10/2023 0300 Gross per 24 hour  Intake 581.03 ml  Output 2100 ml  Net -1518.97 ml     Assessment/Plan:  63 y.o. male is s/p RIJ HD catheter complicated by right hemothorax with chest tube  5 Days Post-Op   PLAN Chest tube is now been removed.  Patient remains extubated and breathing fine with no respiratory issues.  [name] and awake this morning and off blood pressure support medication. Left forearm aVF is thrombosed. Correction from yesterdays note he will NOT need a tunneled catheter sometime this week for outpatient hemodialysis. The catheter he has in his groin is a PERM Cath that can be used for outpatient hemodialysis.    DVT prophylaxis: Heparin  with dialysis   Annamaria [facility]rette [facility] 08/10/2023  10:02 AM

## 2023-08-10 NOTE — Plan of Care (Signed)
  Problem: Clinical Measurements: Goal: Ability to maintain clinical measurements within normal limits will improve Outcome: Progressing Goal: Will remain free from infection Outcome: Progressing Goal: Diagnostic test results will improve Outcome: Progressing Goal: Respiratory complications will improve Outcome: Progressing Goal: Cardiovascular complication will be avoided Outcome: Progressing   Problem: Elimination: Goal: Will not experience complications related to bowel motility Outcome: Not Progressing Goal: Will not experience complications related to urinary retention Outcome: Not Applicable   Problem: Pain Managment: Goal: General experience of comfort will improve and/or be controlled Outcome: Progressing   Problem: Skin Integrity: Goal: Risk for impaired skin integrity will decrease Outcome: Progressing

## 2023-08-10 NOTE — Progress Notes (Signed)
 Physical Therapy Treatment Patient Details Name: Adam Howell MRN: [id number] DOB: May 04, 1960 Today's Date: 08/10/2023   History of Present Illness Pt is a 63 y/o M admitted on 08/05/23 after presenting for elective AV fistula revision. PermCath placement was complicated by innominate vein perforation with large R hemothorax & concern for potential anaphylactic reaction to IV contrast & shock. PMH: ESRD on HD, HFrEF, HTN, HLD    PT Comments  Patient on bedpan on arrival and agreeable to PT/OT co-tx session. Patient able to roll with minA for pericare after successful bowel movement. Required modA for supine>sit and able to perform squat pivot to w/c and back to bed with patient able to set up w/c for return transfer. Likely close to baseline for OOB transfer. Will continue to follow acutely. Discharge plan remains appropriate.     If plan is discharge home, recommend the following: Assistance with feeding;Direct supervision/assist for medications management;Assistance with cooking/housework;Direct supervision/assist for financial management;A little help with walking and/or transfers;A little help with bathing/dressing/bathroom;Assist for transportation   Can travel by private vehicle     No  Equipment Recommendations  Other (comment) (TBD)    Recommendations for Other Services       Precautions / Restrictions Precautions Precautions: Fall Recall of Precautions/Restrictions: Intact Restrictions Weight Bearing Restrictions Per Provider Order: No     Mobility  Bed Mobility Overal bed mobility: Needs Assistance Bed Mobility: Supine to Sit, Sit to Supine     Supine to sit: Mod assist Sit to supine: Contact guard assist, HOB elevated        Transfers Overall transfer level: Needs assistance Equipment used: None Transfers: Bed to chair/wheelchair/BSC       Squat pivot transfers: Contact guard assist, +2 safety/equipment     General transfer comment: able to perform  squat pivot to w/c with CGA and to new bed with similar assist as patient going to new room. Able to set self up for 2nd transfer with remove arm rest    Ambulation/Gait                   Stairs             Wheelchair Mobility     Tilt Bed    Modified Rankin (Stroke Patients Only)       Balance Overall balance assessment: Needs assistance Sitting-balance support: Feet supported, Bilateral upper extremity supported Sitting balance-Leahy Scale: Fair                                      Hotel manager: No apparent difficulties  Cognition Arousal: Alert Behavior During Therapy: WFL for tasks assessed/performed   PT - Cognitive impairments: No family/caregiver present to determine baseline, Orientation                         Following commands: Intact, Impaired Following commands impaired: Follows one step commands with increased time    Cueing Cueing Techniques: Verbal cues  Exercises      General Comments        Pertinent Vitals/Pain Pain Assessment Pain Assessment: Faces Faces Pain Scale: No hurt Pain Intervention(s): Monitored during session    Home Living Family/patient expects to be discharged to:: Skilled nursing facility                   Additional Comments: Pt reports he lives at  nursing home, unable to recall name but gives address (PT googles it & it's [facility]).    Prior Function            PT Goals (current goals can now be found in the care plan section) Acute Rehab PT Goals Patient Stated Goal: get better PT Goal Formulation: With patient Time For Goal Achievement: 08/23/23 Potential to Achieve Goals: Good Progress towards PT goals: Progressing toward goals    Frequency    Min 1X/week      PT Plan      Co-evaluation              AM-PAC PT "6 Clicks" Mobility   Outcome Measure  Help needed turning from your back to your side  while in a flat bed without using bedrails?: A Lot Help needed moving from lying on your back to sitting on the side of a flat bed without using bedrails?: A Lot Help needed moving to and from a bed to a chair (including a wheelchair)?: A Little Help needed standing up from a chair using your arms (e.g., wheelchair or bedside chair)?: A Little Help needed to walk in hospital room?: Total Help needed climbing 3-5 steps with a railing? : Total 6 Click Score: 12    End of Session   Activity Tolerance: Patient tolerated treatment well Patient left: in bed;with call bell/phone within reach (being transferred to new room) Nurse Communication: Mobility status PT Visit Diagnosis: Muscle weakness (generalized) (M62.81);Other abnormalities of gait and mobility (R26.89)     Time: 1610-9604 PT Time Calculation (min) (ACUTE ONLY): 23 min  Charges:    $Therapeutic Activity: 8-22 mins PT General Charges $$ ACUTE PT VISIT: 1 Visit                     Janine Melbourne, PT, DPT Physical Therapist - [facility]  [facility]    Eeshan Verbrugge A Frieda Arnall 08/10/2023, 1:07 PM

## 2023-08-10 NOTE — Progress Notes (Addendum)
 Progress Note    Lc Joynt  ZOX:[id number] DOB: 09/05/1960  DOA: 08/05/2023 PCP: Lemar Pyles, NP      Brief Narrative:    Medical records reviewed and are as summarized below:  Adam Howell is a 63 y.o. male with past medical history significant for ESRD on HD, HFrEF, hypertension, hyperlipidemia, who presented to the hospital for elective AV fistula revision.  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  The patient became hypotensive with SBP in the 60's concerning for bleeding in the diseased central venous system verses possible IV contrast reaction.  Fluroscopy did show some blood in the pleural cavity concerning for hemothorax.     Femoral temporary Dialysis catheter was placed along with femoral central line placement. Consideration was given for placing a chest tube, but it was felt this may result in increased bleeding as the pressure from the existing hematoma would help tamponade any further venous bleeding. Also of note, the patient had been on Eliquis  up till the day prior to this event and had received heparin  during the revision of his fistula.      Pt remained intubated post procedure requiring vasopressors, and was transferred to ICU.   PCCM consulted for further workup and treatment.    Significant Hospital Events: Including procedures, antibiotic start and stop dates in addition to other pertinent events   5/22: Presented for elective AV fistula revision.  During Permcath placement, concern for bleeding and hemothorax.  Also with concern for anaphylaxis with anaphylactic shock following IV contrast requiring Epi gtt.  Returns to ICU post procedure and remains intubated.  PCCM consulted.  Nephrology consulted for HD. 08/06/23- patient on VRE contact precautions. Hemothorax noted on right chest.  Will give FFP and chest tube placement today.  There  is some mediastinal shift and possible tension , patient requiring vasopressor.  I discussed with vascular surgeon Dr Vonna Guardian and daughter POA of patient.  We plan to place chest tube on right for hemothorax.  08/07/23- no overnight events, patient is off of vasopressors this am. 80cc serosang return from chest tube overnight but minimal return during dayshift today.  Daughter coming to see patient today.  08/08/23- patient liberated of MV this am. Overnight chest tube with no additional drainage post clamping. Removed chest tube.  For SLP. 08/09/23- No significant events noted overnight, tolerating extubation well from respiratory standpoint. Repeat Chest X-ray without PTX or hemothorax.  Remains on low dose levophed (4 mcg).  Hgb remains stable, check cortisol and Echocardiogram.     Assessment/Plan:   Principal Problem:   ESRD (end stage renal disease) (HCC) Active Problems:   Type 2 diabetes mellitus with ESRD (end-stage renal disease) (HCC)   Atrial fibrillation, chronic (HCC)   Anemia in chronic kidney disease   Obesity (BMI 30-39.9)   Peripheral vascular disease (HCC)   Chronic pain syndrome   Heart failure with reduced ejection fraction (HCC)   Hypertension   S/P AKA (above knee amputation), right (HCC)   Pressure injury of skin   Shock circulatory (HCC)   Nutrition Problem: Inadequate oral intake Etiology: inability to eat  Signs/Symptoms: NPO status   Body mass index is 30.26 kg/m.  (Obesity)    Anaphylactic process hemorrhagic shock: Resolved He is off of IV vasopressors.  Continue midodrine for now.   Right hemothorax: S/p chest tube placement that was removed on 08/08/2023.  Acute blood loss anemia: H&H stable. S/p transfusion with 1 unit of FFP on 08/06/2023 because he was on Eliquis  prior to hemothorax.   Acute hypoxic respiratory failure: Resolved.  He is tolerating room air.  S/p extubation on 08/08/2023. Tracheal aspirate from 08/07/2023 showed MRSA.  Case  discussed with Dustin (ID pharmacist) and Dr. Darnelle Elders, intensivist who had previously seen the patient.  MRSA pneumonia not suspected at this time  per Dr. Darnelle Elders so will not treat.  Clinically deterioration at that time was mainly attributed to hemothorax and shock.  However, MRSA treatment will be considered if patient develops pneumonia in the near future   ESRD on hemodialysis, s/p right arm AV fistula revision on 08/05/2023: Plan for PermCath placement.  He has a right femoral permacath for hemodialysis. Plan for hemodialysis tomorrow.  Follow-up with nephrologist and vascular surgeon.   Mildly elevated troponin: Suspected to be due to demand ischemia   Chronic atrial fibrillation: Eliquis  on hold   Chronic HFpEF, history of chronic HFrEF with recovered EF, severe LVH: Recommend outpatient follow-up with cardiologist for further management. 2D echo on 08/09/2023 showed EF 50 to 55%, severe concentric LVH, moderately reduced RV function, severely dilated LA, moderately dilated RA   Adrenal insufficiency: Cortisol level was 1.5.  Continue IV hydrocortisone for now.     Type II DM: NovoLog  as needed for hyperglycemia   Hyponatremia: Improved   Transfer from ICU to MedSurg   Chart reviewed.  Diet Order             DIET DYS 3 Fluid consistency: Thin  Diet effective now                            Consultants: Intensivist Vascular surgeon Nephrologist   Procedures: Revision of left radiocephalic AV fistula on 08/05/2023 Chest tube placement for hemothorax on 08/06/2023    Medications:    amiodarone   200 mg Oral Daily   vitamin C  500 mg Oral BID   Chlorhexidine  Gluconate Cloth  6 each Topical Q0600   docusate sodium   100 mg Oral BID   epoetin  alfa-epbx (RETACRIT ) injection  10,000 Units Subcutaneous Q M,W,F-1800   feeding supplement (NEPRO CARB STEADY)  237 mL Oral TID BM   hydrocerin   Topical Daily   hydrocortisone sod succinate (SOLU-CORTEF) inj  50  mg Intravenous Q6H   insulin  aspart  0-9 Units Subcutaneous Q4H   iron  polysaccharides  150 mg Oral Daily   ketotifen  1 drop Both Eyes BID   lidocaine   5 mL Intradermal Once   midodrine  10 mg Oral TID   multivitamin  1 tablet Oral QHS   polyethylene glycol  17 g Oral Daily   rosuvastatin   40 mg Oral Daily   zinc sulfate (50mg  elemental zinc)  220 mg Oral Daily   Continuous Infusions:  piperacillin -tazobactam (ZOSYN )  IV 2.25 g (08/10/23 0508)     Anti-infectives (From admission, onward)    Start     Dose/Rate Route Frequency Ordered Stop   08/06/23 0900  piperacillin -tazobactam (ZOSYN ) IVPB 2.25 g        2.25 g 100 mL/hr over 30 Minutes Intravenous Every 8 hours 08/06/23 0812 08/10/23 2359   08/05/23 0600  ceFAZolin  (ANCEF ) IVPB 2g/100 mL premix        2 g 200 mL/hr over 30 Minutes Intravenous On call to O.R. 08/04/23 2248 08/05/23 1143  Family Communication/Anticipated D/C date and plan/Code Status   DVT prophylaxis: SCDs Start: 08/05/23 1453     Code Status: Full Code  Family Communication: None Disposition Plan: Plan to discharge home   Status is: Inpatient Remains inpatient appropriate because: S/p hemothorax       Subjective:   Interval events noted.  Objective:    Vitals:   08/10/23 0300 08/10/23 0400 08/10/23 0500 08/10/23 0600  BP: 110/79 117/70 (!) 137/95 109/60  Pulse:      Resp: 13 18 18  (!) 24  Temp:  98.3 F (36.8 C)    TempSrc:      SpO2:      Weight:      Height:       No data found.   Intake/Output Summary (Last 24 hours) at 08/10/2023 0851 Last data filed at 08/10/2023 0300 Gross per 24 hour  Intake 581.03 ml  Output 2100 ml  Net -1518.97 ml   Filed Weights   08/07/23 2108 08/08/23 0054 08/09/23 1920  Weight: 110.9 kg 108.8 kg 101.2 kg    Exam:  GEN: NAD SKIN: Warm and dry EYES: No pallor or icterus ENT: MMM CV: RRR PULM: CTA B ABD: soft, ND, NT, +BS CNS: AAO x 3, non focal EXT: No edema or  tenderness    Pressure Injury 03/02/23 Coccyx (Active)  03/02/23 0600  Location: Coccyx  Location Orientation:   Staging:   Wound Description (Comments):   Present on Admission: No     Pressure Injury 08/05/23 Ankle Anterior;Left Stage 2 -  Partial thickness loss of dermis presenting as a shallow open injury with a red, pink wound bed without slough. open wound (Active)  08/05/23 1230  Location: Ankle  Location Orientation: Anterior;Left  Staging: Stage 2 -  Partial thickness loss of dermis presenting as a shallow open injury with a red, pink wound bed without slough.  Wound Description (Comments): open wound  Present on Admission: Yes  Dressing Type Foam - Lift dressing to assess site every shift 08/10/23 0000     Pressure Injury 08/05/23 Foot Left;Lateral Stage 2 -  Partial thickness loss of dermis presenting as a shallow open injury with a red, pink wound bed without slough. open wound (Active)  08/05/23 1230  Location: Foot  Location Orientation: Left;Lateral  Staging: Stage 2 -  Partial thickness loss of dermis presenting as a shallow open injury with a red, pink wound bed without slough.  Wound Description (Comments): open wound  Present on Admission: Yes  Dressing Type Foam - Lift dressing to assess site every shift 08/10/23 0000     Data Reviewed:   I have personally reviewed following labs and imaging studies:  Labs: Labs show the following:   Basic Metabolic Panel: Recent Labs  Lab 08/06/23 0549 08/07/23 0348 08/08/23 0307 08/09/23 0208 08/10/23 0354  NA 132* 132* 133* 132* 135  K 5.5* 4.7 3.8 4.0 3.7  CL 95* 94* 93* 92* 95*  CO2 25 25 27 26 28   GLUCOSE 174* 154* 135* 149* 144*  BUN 29* 46* 26* 48* 35*  CREATININE 6.85* 8.41* 5.26* 7.38* 5.23*  CALCIUM  8.8* 8.4* 8.4* 8.1* 8.3*  MG  --   --  1.9 2.4  --   PHOS 3.0 3.9 4.4 4.3 2.8   GFR Estimated Creatinine Clearance: 18 mL/min (A) (by C-G formula based on SCr of 5.23 mg/dL (H)). Liver Function  Tests: Recent Labs  Lab 08/05/23 1305 08/06/23 0549 08/07/23 0348 08/08/23 0307 08/10/23 0354  AST  17  --   --   --   --   ALT 17  --   --   --   --   ALKPHOS 84  --   --   --   --   BILITOT 0.8  --   --   --   --   PROT 6.5  --   --   --   --   ALBUMIN  3.2* 3.4* 2.8* 2.9* 2.8*   No results for input(s): "LIPASE", "AMYLASE" in the last 168 hours. No results for input(s): "AMMONIA" in the last 168 hours. Coagulation profile Recent Labs  Lab 08/05/23 1305  INR 1.3*    CBC: Recent Labs  Lab 08/05/23 0654 08/05/23 1305 08/05/23 2143 08/06/23 2104 08/07/23 0348 08/08/23 0307 08/08/23 1530 08/09/23 0850 08/10/23 0354  WBC 3.8* 15.0*   < > 14.0* 14.6* 14.7*  --  12.9* 10.5  NEUTROABS 1.7 12.6*  --   --   --   --   --   --   --   HGB 14.5  16.3 11.9*   < > 8.4* 8.4* 8.5* 7.7* 8.0* 8.9*  HCT 45.4  48.0 37.1*   < > 24.7* 24.8* 24.7* 23.0* 24.5* 27.5*  MCV 88.2 88.5   < > 84.3 83.8 83.4  --  85.7 87.6  PLT 108* 160   < > 141* 141* 131*  --  124* 125*   < > = values in this interval not displayed.   Cardiac Enzymes: No results for input(s): "CKTOTAL", "CKMB", "CKMBINDEX", "TROPONINI" in the last 168 hours. BNP (last 3 results) No results for input(s): "PROBNP" in the last 8760 hours. CBG: Recent Labs  Lab 08/09/23 1636 08/09/23 1950 08/09/23 2339 08/10/23 0328 08/10/23 0813  GLUCAP 222* 140* 117* 167* 91   D-Dimer: No results for input(s): "DDIMER" in the last 72 hours. Hgb A1c: No results for input(s): "HGBA1C" in the last 72 hours. Lipid Profile: No results for input(s): "CHOL", "HDL", "LDLCALC", "TRIG", "CHOLHDL", "LDLDIRECT" in the last 72 hours. Thyroid  function studies: No results for input(s): "TSH", "T4TOTAL", "T3FREE", "THYROIDAB" in the last 72 hours.  Invalid input(s): "FREET3" Anemia work up: No results for input(s): "VITAMINB12", "FOLATE", "FERRITIN", "TIBC", "IRON ", "RETICCTPCT" in the last 72 hours. Sepsis Labs: Recent Labs  Lab  08/06/23 1433 08/06/23 2104 08/07/23 0348 08/07/23 2110 08/08/23 0307 08/09/23 0850 08/09/23 1005 08/10/23 0354  WBC  --  14.0* 14.6*  --  14.7* 12.9*  --  10.5  LATICACIDVEN 5.2* 2.9*  --  0.8  --   --  1.2  --     Microbiology Recent Results (from the past 240 hours)  Culture, Respiratory w Gram Stain     Status: None (Preliminary result)   Collection Time: 08/07/23  8:09 AM   Specimen: Tracheal Aspirate; Respiratory  Result Value Ref Range Status   Specimen Description   Final    TRACHEAL ASPIRATE Performed at [facility], [address] [place], [place] [address]    Special Requests   Final    NONE Performed at [facility], [address] [place], [place] [address]    Gram Stain   Final    FEW WBC PRESENT, PREDOMINANTLY MONONUCLEAR FEW GRAM POSITIVE COCCI IN CLUSTERS    Culture   Final    FEW STAPHYLOCOCCUS AUREUS SUSCEPTIBILITIES TO FOLLOW Performed at [facility], [address], [place], [place] [address]    Report Status PENDING  Incomplete  Procedures and diagnostic studies:  DG Chest Port 1 View Result Date: 08/10/2023 CLINICAL DATA:  Pleural effusion. EXAM: PORTABLE CHEST 1 VIEW COMPARISON:  08/09/2023 FINDINGS: The cardio pericardial silhouette is enlarged. Basilar atelectasis bilaterally with improved aeration in the retrocardiac left base compared to prior. No acute bony abnormality. Telemetry leads overlie the chest. Trace soft tissue gas seen in the right supraclavicular region previously has resolved. IMPRESSION: Basilar atelectasis bilaterally with improved aeration in the retrocardiac left base compared to prior. Electronically Signed   By: Donnal Fusi M.D.   On: 08/10/2023 07:34   ECHOCARDIOGRAM COMPLETE Result Date: 08/09/2023    ECHOCARDIOGRAM REPORT   Patient Name:   Adam Howell Date of Exam: 08/09/2023 Medical Rec #:  [id number]           Height:       72.0 in Accession #:    [id number]          Weight:        239.9 lb Date of Birth:  [birth year]           BSA:          2.302 m Patient Age:    63 years            BP:           95/54 mmHg Patient Gender: M                   HR:           57 bpm. Exam Location:  [facility] Procedure: 2D Echo, Color Doppler and Cardiac Doppler (Both Spectral and Color            Flow Doppler were utilized during procedure). Indications:     R57.9 Shock  History:         Patient has prior history of Echocardiogram examinations. ESRD,                  Arrythmias:Atrial Fibrillation; Risk Factors:Diabetes,                  Hypertension and Dyslipidemia.  Sonographer:     L. Thornton-Maynard Referring Phys:  [id number] Delanna Fears Diagnosing Phys: Jackquelyn Mass MD IMPRESSIONS  1. Left ventricular ejection fraction, by estimation, is 55 to 60%. The left ventricle has normal function. The left ventricle has no regional wall motion abnormalities. There is severe concentric left ventricular hypertrophy. Left ventricular diastolic  function could not be evaluated.  2. Right ventricular systolic function is moderately reduced. The right ventricular size is moderately enlarged. There is mildly elevated pulmonary artery systolic pressure. The estimated right ventricular systolic pressure is 41.4 mmHg.  3. Left atrial size was severely dilated.  4. Right atrial size was moderately dilated.  5. The mitral valve is grossly normal. Trivial mitral valve regurgitation. No evidence of mitral stenosis.  6. The aortic valve is tricuspid. Aortic valve regurgitation is not visualized. Aortic valve sclerosis/calcification is present, without any evidence of aortic stenosis.  7. The inferior vena cava is normal in size with greater than 50% respiratory variability, suggesting right atrial pressure of 3 mmHg. Comparison(s): Changes from prior study are noted. Severe concentric LVH up to 2.0 cm. LVEF is normal and improved from prior. Moderate RV dilation with moderately reduced function. Severe LVH may be related to  hypertensive heart disease per chart review, but would consider work-up for infiltrative cardiomyopathy such as cardiac amyloidosis. FINDINGS  Left Ventricle: Left ventricular ejection fraction,  by estimation, is 55 to 60%. The left ventricle has normal function. The left ventricle has no regional wall motion abnormalities. The left ventricular internal cavity size was normal in size. There is  severe concentric left ventricular hypertrophy. Left ventricular diastolic function could not be evaluated due to atrial fibrillation. Left ventricular diastolic function could not be evaluated. Right Ventricle: The right ventricular size is moderately enlarged. No increase in right ventricular wall thickness. Right ventricular systolic function is moderately reduced. There is mildly elevated pulmonary artery systolic pressure. The tricuspid regurgitant velocity is 3.10 m/s, and with an assumed right atrial pressure of 3 mmHg, the estimated right ventricular systolic pressure is 41.4 mmHg. Left Atrium: Left atrial size was severely dilated. Right Atrium: Right atrial size was moderately dilated. Pericardium: Trivial pericardial effusion is present. Mitral Valve: The mitral valve is grossly normal. Trivial mitral valve regurgitation. No evidence of mitral valve stenosis. Tricuspid Valve: The tricuspid valve is grossly normal. Tricuspid valve regurgitation is mild . No evidence of tricuspid stenosis. Aortic Valve: The aortic valve is tricuspid. Aortic valve regurgitation is not visualized. Aortic valve sclerosis/calcification is present, without any evidence of aortic stenosis. Aortic valve mean gradient measures 4.7 mmHg. Aortic valve peak gradient measures 9.7 mmHg. Aortic valve area, by VTI measures 1.92 cm. Pulmonic Valve: The pulmonic valve was grossly normal. Pulmonic valve regurgitation is not visualized. No evidence of pulmonic stenosis. Aorta: The aortic root and ascending aorta are structurally normal, with no  evidence of dilitation. Venous: The inferior vena cava is normal in size with greater than 50% respiratory variability, suggesting right atrial pressure of 3 mmHg. IAS/Shunts: The atrial septum is grossly normal.  LEFT VENTRICLE PLAX 2D LVIDd:         4.00 cm     Diastology LVIDs:         3.00 cm     LV e' medial:    5.19 cm/s LV PW:         2.00 cm     LV E/e' medial:  13.0 LV IVS:        2.00 cm     LV e' lateral:   5.43 cm/s LVOT diam:     2.20 cm     LV E/e' lateral: 12.4 LV SV:         41 LV SV Index:   18 LVOT Area:     3.80 cm  LV Volumes (MOD) LV vol d, MOD A2C: 57.9 ml LV vol d, MOD A4C: 62.5 ml LV vol s, MOD A2C: 21.8 ml LV vol s, MOD A4C: 19.0 ml LV SV MOD A2C:     36.1 ml LV SV MOD A4C:     62.5 ml LV SV MOD BP:      39.9 ml RIGHT VENTRICLE             IVC RV Basal diam:  4.00 cm     IVC diam: 1.30 cm RV S prime:     10.60 cm/s TAPSE (M-mode): 1.0 cm LEFT ATRIUM              Index        RIGHT ATRIUM           Index LA diam:        4.80 cm  2.09 cm/m   RA Area:     25.80 cm LA Vol (A2C):   147.0 ml 63.87 ml/m  RA Volume:   87.30 ml  37.93 ml/m LA Vol (A4C):  113.0 ml 49.10 ml/m LA Biplane Vol: 131.0 ml 56.92 ml/m  AORTIC VALVE                    PULMONIC VALVE AV Area (Vmax):    1.95 cm     PV Vmax:       1.19 m/s AV Area (Vmean):   2.03 cm     PV Peak grad:  5.7 mmHg AV Area (VTI):     1.92 cm AV Vmax:           155.33 cm/s AV Vmean:          97.967 cm/s AV VTI:            0.214 m AV Peak Grad:      9.7 mmHg AV Mean Grad:      4.7 mmHg LVOT Vmax:         79.80 cm/s LVOT Vmean:        52.300 cm/s LVOT VTI:          0.108 m LVOT/AV VTI ratio: 0.50  AORTA Ao Root diam: 3.40 cm Ao Asc diam:  3.20 cm MITRAL VALVE               TRICUSPID VALVE MV Area (PHT): 3.06 cm    TR Peak grad:   38.4 mmHg MV Decel Time: 248 msec    TR Vmax:        310.00 cm/s MV E velocity: 67.30 cm/s                            SHUNTS                            Systemic VTI:  0.11 m                            Systemic Diam:  2.20 cm Jackquelyn Mass MD Electronically signed by Jackquelyn Mass MD Signature Date/Time: 08/09/2023/2:02:31 PM    Final    DG Chest Port 1 View Result Date: 08/09/2023 CLINICAL DATA:  End-stage renal disease. EXAM: PORTABLE CHEST 1 VIEW COMPARISON:  08/08/2023 FINDINGS: The cardio pericardial silhouette is enlarged. Left base collapse/consolidation appears progressive in the interval. No overt pulmonary edema or substantial pleural effusion. No acute bony abnormality. Telemetry leads overlie the chest. Trace soft tissue gas again seen in the right supraclavicular region. IMPRESSION: Progressive left base collapse/consolidation. No evidence for pneumothorax with persistent minimal soft tissue gas in the right supraclavicular region. Electronically Signed   By: Donnal Fusi M.D.   On: 08/09/2023 09:16   DG Chest Port 1 View Result Date: 08/08/2023 CLINICAL DATA:  Chest tube removal. EXAM: PORTABLE CHEST 1 VIEW COMPARISON:  08/08/2023 at 5:53 a.m. FINDINGS: Interval removal of nasogastric tube, enteric tube and right-sided chest tube. A catheter remains in place with tip projecting over the right atrium unchanged. Lungs are adequately inflated and otherwise clear. No evidence of right-sided pneumothorax. Cardiomediastinal silhouette and remainder of the exam is unchanged. IMPRESSION: Interval removal of right-sided chest tube without evidence of right-sided pneumothorax. Electronically Signed   By: Roda Cirri M.D.   On: 08/08/2023 11:14               LOS: 5 days   Tishanna Dunford  [facility]   Pager on [email or web address]. If 7PM-7AM, please contact night-coverage at  [email or web address]     08/10/2023, 8:51 AM

## 2023-08-11 ENCOUNTER — Inpatient Hospital Stay

## 2023-08-11 DIAGNOSIS — I482 Chronic atrial fibrillation, unspecified: Secondary | ICD-10-CM | POA: Diagnosis not present

## 2023-08-11 DIAGNOSIS — I5020 Unspecified systolic (congestive) heart failure: Secondary | ICD-10-CM | POA: Diagnosis not present

## 2023-08-11 DIAGNOSIS — N186 End stage renal disease: Secondary | ICD-10-CM

## 2023-08-11 LAB — CBC
HCT: 25.8 % — ABNORMAL LOW (ref 39.0–52.0)
Hemoglobin: 8.6 g/dL — ABNORMAL LOW (ref 13.0–17.0)
MCH: 28.8 pg (ref 26.0–34.0)
MCHC: 33.3 g/dL (ref 30.0–36.0)
MCV: 86.3 fL (ref 80.0–100.0)
Platelets: 157 10*3/uL (ref 150–400)
RBC: 2.99 MIL/uL — ABNORMAL LOW (ref 4.22–5.81)
RDW: 15.8 % — ABNORMAL HIGH (ref 11.5–15.5)
WBC: 12 10*3/uL — ABNORMAL HIGH (ref 4.0–10.5)
nRBC: 1.3 % — ABNORMAL HIGH (ref 0.0–0.2)

## 2023-08-11 LAB — RENAL FUNCTION PANEL
Albumin: 2.6 g/dL — ABNORMAL LOW (ref 3.5–5.0)
Anion gap: 13 (ref 5–15)
BUN: 53 mg/dL — ABNORMAL HIGH (ref 8–23)
CO2: 27 mmol/L (ref 22–32)
Calcium: 8.1 mg/dL — ABNORMAL LOW (ref 8.9–10.3)
Chloride: 93 mmol/L — ABNORMAL LOW (ref 98–111)
Creatinine, Ser: 7.63 mg/dL — ABNORMAL HIGH (ref 0.61–1.24)
GFR, Estimated: 7 mL/min — ABNORMAL LOW (ref >60)
Glucose, Bld: 204 mg/dL — ABNORMAL HIGH (ref 70–99)
Phosphorus: 2.7 mg/dL (ref 2.5–4.6)
Potassium: 3.9 mmol/L (ref 3.5–5.1)
Sodium: 133 mmol/L — ABNORMAL LOW (ref 135–145)

## 2023-08-11 LAB — GLUCOSE, CAPILLARY
Glucose-Capillary: 180 mg/dL — ABNORMAL HIGH (ref 70–99)
Glucose-Capillary: 159 mg/dL — ABNORMAL HIGH (ref 70–99)
Glucose-Capillary: 191 mg/dL — ABNORMAL HIGH (ref 70–99)
Glucose-Capillary: 143 mg/dL — ABNORMAL HIGH (ref 70–99)

## 2023-08-11 LAB — FERRITIN: Ferritin: 71 ng/mL (ref 24–336)

## 2023-08-11 LAB — IRON AND TIBC
Iron: 59 ug/dL (ref 45–182)
Saturation Ratios: 25 % (ref 17.9–39.5)
TIBC: 237 ug/dL — ABNORMAL LOW (ref 250–450)
UIBC: 178 ug/dL

## 2023-08-11 LAB — VITAMIN B12: Vitamin B-12: 745 pg/mL (ref 180–914)

## 2023-08-11 MED ORDER — HEPARIN SODIUM (PORCINE) 1000 UNIT/ML IJ SOLN
INTRAMUSCULAR | Status: AC
  Filled 2023-08-11: qty 10

## 2023-08-11 MED ORDER — EPOETIN ALFA-EPBX 10000 UNIT/ML IJ SOLN
10000.0000 [IU] | INTRAMUSCULAR | Status: DC
  Administered 2023-08-11 – 2023-08-16 (×3): 10000 [IU] via INTRAVENOUS
  Filled 2023-08-11 (×2): qty 2

## 2023-08-11 MED ORDER — EPOETIN ALFA-EPBX 10000 UNIT/ML IJ SOLN
INTRAMUSCULAR | Status: AC
  Filled 2023-08-11: qty 1

## 2023-08-11 MED ORDER — ALTEPLASE 2 MG IJ SOLR: 2.0000 mg | Freq: Once | INTRAMUSCULAR | Status: DC | PRN

## 2023-08-11 MED ORDER — HEPARIN SODIUM (PORCINE) 1000 UNIT/ML DIALYSIS
1000.0000 [IU] | INTRAMUSCULAR | Status: DC | PRN
Start: 2023-08-11 — End: 2023-08-11

## 2023-08-11 NOTE — Plan of Care (Signed)
  Problem: Activity: Goal: Ability to tolerate increased activity will improve Outcome: Progressing   Problem: Respiratory: Goal: Ability to maintain a clear airway and adequate ventilation will improve Outcome: Progressing   Problem: Role Relationship: Goal: Method of communication will improve Outcome: Progressing   Problem: Education: Goal: Knowledge of General Education information will improve Description: Including pain rating scale, medication(s)/side effects and non-pharmacologic comfort measures Outcome: Progressing   Problem: Health Behavior/Discharge Planning: Goal: Ability to manage health-related needs will improve Outcome: Progressing   Problem: Clinical Measurements: Goal: Ability to maintain clinical measurements within normal limits will improve Outcome: Progressing Goal: Will remain free from infection Outcome: Progressing Goal: Diagnostic test results will improve Outcome: Progressing Goal: Respiratory complications will improve Outcome: Progressing Goal: Cardiovascular complication will be avoided Outcome: Progressing   Problem: Activity: Goal: Risk for activity intolerance will decrease Outcome: Progressing   Problem: Nutrition: Goal: Adequate nutrition will be maintained Outcome: Progressing   Problem: Coping: Goal: Level of anxiety will decrease Outcome: Progressing   Problem: Elimination: Goal: Will not experience complications related to bowel motility Outcome: Progressing   Problem: Pain Managment: Goal: General experience of comfort will improve and/or be controlled Outcome: Progressing   Problem: Safety: Goal: Ability to remain free from injury will improve Outcome: Progressing   Problem: Skin Integrity: Goal: Risk for impaired skin integrity will decrease Outcome: Progressing   Problem: Education: Goal: Ability to describe self-care measures that may prevent or decrease complications (Diabetes Survival Skills Education) will  improve Outcome: Progressing Goal: Individualized Educational Video(s) Outcome: Progressing   Problem: Coping: Goal: Ability to adjust to condition or change in health will improve Outcome: Progressing   Problem: Fluid Volume: Goal: Ability to maintain a balanced intake and output will improve Outcome: Progressing   Problem: Health Behavior/Discharge Planning: Goal: Ability to identify and utilize available resources and services will improve Outcome: Progressing Goal: Ability to manage health-related needs will improve Outcome: Progressing   Problem: Metabolic: Goal: Ability to maintain appropriate glucose levels will improve Outcome: Progressing   Problem: Nutritional: Goal: Maintenance of adequate nutrition will improve Outcome: Progressing Goal: Progress toward achieving an optimal weight will improve Outcome: Progressing   Problem: Skin Integrity: Goal: Risk for impaired skin integrity will decrease Outcome: Progressing   Problem: Tissue Perfusion: Goal: Adequacy of tissue perfusion will improve Outcome: Progressing

## 2023-08-11 NOTE — Plan of Care (Signed)
  Problem: Activity: Goal: Ability to tolerate increased activity will improve Outcome: Progressing   Problem: Respiratory: Goal: Ability to maintain a clear airway and adequate ventilation will improve Outcome: Progressing   Problem: Role Relationship: Goal: Method of communication will improve Outcome: Progressing   Problem: Education: Goal: Knowledge of General Education information will improve Description: Including pain rating scale, medication(s)/side effects and non-pharmacologic comfort measures Outcome: Progressing   Problem: Health Behavior/Discharge Planning: Goal: Ability to manage health-related needs will improve Outcome: Progressing   Problem: Clinical Measurements: Goal: Ability to maintain clinical measurements within normal limits will improve Outcome: Progressing Goal: Will remain free from infection Outcome: Progressing Goal: Diagnostic test results will improve Outcome: Progressing Goal: Respiratory complications will improve Outcome: Progressing Goal: Cardiovascular complication will be avoided Outcome: Progressing   Problem: Activity: Goal: Risk for activity intolerance will decrease Outcome: Progressing   Problem: Nutrition: Goal: Adequate nutrition will be maintained Outcome: Progressing   Problem: Coping: Goal: Level of anxiety will decrease Outcome: Progressing   Problem: Elimination: Goal: Will not experience complications related to bowel motility Outcome: Progressing

## 2023-08-11 NOTE — Progress Notes (Incomplete)
  Progress Note    08/11/2023 4:00 PM 6 Days Post-Op  Subjective: Keano Guggenheim is a 63 year old male who is now 6 days postop from right IJ hemodialysis catheter   Vitals:   08/11/23 1136 08/11/23 1240  BP: 105/76 (!) 102/59  Pulse: 69 67  Resp: 18 16  Temp: 98.5 F (36.9 C) 98.5 F (36.9 C)  SpO2: 98% 99%   Physical Exam: Cardiac:  *** Lungs:  *** Incisions:  *** Extremities:  *** Abdomen:  *** Neurologic: ***  CBC    Component Value Date/Time   WBC 12.0 (H) 08/11/2023 0350   RBC 2.99 (L) 08/11/2023 0350   HGB 8.6 (L) 08/11/2023 0350   HGB 16.8 01/12/2023 1414   HCT 25.8 (L) 08/11/2023 0350   HCT 51.0 01/12/2023 1414   PLT 157 08/11/2023 0350   PLT 257 01/12/2023 1414   MCV 86.3 08/11/2023 0350   MCV 88 01/12/2023 1414   MCH 28.8 08/11/2023 0350   MCHC 33.3 08/11/2023 0350   RDW 15.8 (H) 08/11/2023 0350   RDW 14.8 01/12/2023 1414   LYMPHSABS 1.6 08/05/2023 1305   LYMPHSABS 1.0 01/12/2023 1414   MONOABS 0.4 08/05/2023 1305   EOSABS 0.1 08/05/2023 1305   EOSABS 0.2 01/12/2023 1414   BASOSABS 0.1 08/05/2023 1305   BASOSABS 0.1 01/12/2023 1414    BMET    Component Value Date/Time   NA 133 (L) 08/11/2023 0350   NA 136 04/14/2023 1610   K 3.9 08/11/2023 0350   CL 93 (L) 08/11/2023 0350   CO2 27 08/11/2023 0350   GLUCOSE 204 (H) 08/11/2023 0350   BUN 53 (H) 08/11/2023 0350   BUN 18 04/14/2023 1610   CREATININE 7.63 (H) 08/11/2023 0350   CALCIUM  8.1 (L) 08/11/2023 0350   GFRNONAA 7 (L) 08/11/2023 0350    INR    Component Value Date/Time   INR 1.3 (H) 08/05/2023 1305     Intake/Output Summary (Last 24 hours) at 08/11/2023 1600 Last data filed at 08/11/2023 1427 Gross per 24 hour  Intake 240 ml  Output 2000 ml  Net -1760 ml     Assessment/Plan:  63 y.o. male is s/p *** 6 Days Post-Op  *** DVT prophylaxis:  ***   Jianni Batten R [facility] 08/11/2023 4:00 PM

## 2023-08-11 NOTE — Progress Notes (Addendum)
 PT Cancellation Note  Patient Details Name: Suhas Estis MRN: [id number] DOB: 02-06-1961   Cancelled Treatment:     Pt had HD this morning. Currently just received bath and endorses feeling too tired to participate. Author encouraged pt to get OOB to w/c but he politely refused.  " Can we wait till morning please." Acute PT will return at a later time/date as requested.    Koleen Perna 08/11/2023, 2:54 PM

## 2023-08-11 NOTE — Progress Notes (Deleted)
  Aug 11, 2023  Patient: Adam Howell  Date of Birth: 1960-03-26  Date of Visit: 07/28/2023    To Whom It May Concern:  Chriss Coup was at [facility] visiting Nagi Furio who is currently admitted here.   Sincerely,   Mamie Searles, Forensic scientist

## 2023-08-11 NOTE — Progress Notes (Signed)
  Received patient in bed to unit.   Informed consent signed and in chart.    TX duration:     Transported by  Hand-off given to patient's nurse. No concerns with dialysis treatment.   Access used: Catheter Access issues: none   Total UF removed: 2.0 kg Medication(s) given: Retacrit  10,000 Post HD VS: wnl Post HD weight: 97.3 kg     N. Khiry Pasquariello LPN [facility]

## 2023-08-11 NOTE — Hospital Course (Signed)
 Adam Howell is a 63 y.o. male with past medical history significant for ESRD on HD, HFrEF, hypertension, hyperlipidemia, who presented to the hospital for elective AV fistula revision.   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  The patient became hypotensive with SBP in the 60's concerning for bleeding in the diseased central venous system verses possible IV contrast reaction.  Fluroscopy did show some blood in the pleural cavity concerning for hemothorax.     Femoral temporary Dialysis catheter was placed along with femoral central line placement. Consideration was given for placing a chest tube, but it was felt this may result in increased bleeding as the pressure from the existing hematoma would help tamponade any further venous bleeding. Also of note, the patient had been on Eliquis  up till the day prior to this event and had received heparin  during the revision of his fistula.      Pt remained intubated post procedure requiring vasopressors, and was transferred to ICU.  A chest tube was placed on 5/23 for increased hemothorax, which was removed on 5/25.  Extubated on 5/26.

## 2023-08-11 NOTE — Progress Notes (Signed)
 [facility]  ROUNDING NOTE   Subjective:   Adam Howell is a 63 y.o male with past medical history of diabetes, hypertension, CAD, Rt AKA, anemia, CHF, and ESRD on dialysis. Patient presents to vascular surgery for revision of AVF. He is being admitted for End stage renal disease (HCC) [N18.6] ESRD (end stage renal disease) (HCC) [N18.6]  Patient had anaphylactic shock prior to his procedure.   Patient had a right femoral temp HD catheter placed and receiving dialysis.   Update:   Patient seen and evaluated during dialysis.    HEMODIALYSIS FLOWSHEET:  Blood Flow Rate (mL/min): 349 mL/min Arterial Pressure (mmHg): -218.78 mmHg Venous Pressure (mmHg): 160.39 mmHg TMP (mmHg): 14.54 mmHg Ultrafiltration Rate (mL/min): 614 mL/min Dialysate Flow Rate (mL/min): 300 ml/min Dialysis Fluid Bolus: Normal Saline Bolus Amount (mL): 100 mL  No complaints to offer HD RN has decreased UF due to hypotension   Objective:  Vital signs in last 24 hours:  Temp:  [98.1 F (36.7 C)-98.8 F (37.1 C)] 98.3 F (36.8 C) (05/28 0739) Pulse Rate:  [56-89] 89 (05/28 1000) Resp:  [10-20] 18 (05/28 1000) BP: (90-137)/(46-90) 137/90 (05/28 1000) SpO2:  [97 %-100 %] 97 % (05/28 1000) Weight:  [100 kg] 100 kg (05/28 0809)  Weight change:  Filed Weights   08/08/23 0054 08/09/23 1920 08/11/23 0809  Weight: 108.8 kg 101.2 kg 100 kg    Intake/Output: I/O last 3 completed shifts: In: 388.4 [I.V.:8.4; NG/GT:237; IV Piggyback:143] Out: 2100 [Other:2100]   Intake/Output this shift:  No intake/output data recorded.  Physical Exam: General: No acute distress  Head: [place]/AT, hearing intact  Eyes: Anicteric  Lungs:  Clear bilateral  Heart: Regular rate and rhythm  Abdomen:  Soft, nontender, obese  Extremities: No peripheral edema  Right AKA  Neurologic: Alert and oriented  Skin: No lesions  Access: Left aVF (no bruit or thrill) right HD femoral permcath    Basic Metabolic  Panel: Recent Labs  Lab 08/07/23 0348 08/08/23 0307 08/09/23 0208 08/10/23 0354 08/11/23 0350  NA 132* 133* 132* 135 133*  K 4.7 3.8 4.0 3.7 3.9  CL 94* 93* 92* 95* 93*  CO2 25 27 26 28 27   GLUCOSE 154* 135* 149* 144* 204*  BUN 46* 26* 48* 35* 53*  CREATININE 8.41* 5.26* 7.38* 5.23* 7.63*  CALCIUM  8.4* 8.4* 8.1* 8.3* 8.1*  MG  --  1.9 2.4  --   --   PHOS 3.9 4.4 4.3 2.8 2.7    Liver Function Tests: Recent Labs  Lab 08/05/23 1305 08/06/23 0549 08/07/23 0348 08/08/23 0307 08/10/23 0354 08/11/23 0350  AST 17  --   --   --   --   --   ALT 17  --   --   --   --   --   ALKPHOS 84  --   --   --   --   --   BILITOT 0.8  --   --   --   --   --   PROT 6.5  --   --   --   --   --   ALBUMIN  3.2* 3.4* 2.8* 2.9* 2.8* 2.6*   No results for input(s): "LIPASE", "AMYLASE" in the last 168 hours. No results for input(s): "AMMONIA" in the last 168 hours.  CBC: Recent Labs  Lab 08/05/23 0654 08/05/23 1305 08/05/23 2143 08/07/23 0348 08/08/23 0307 08/08/23 1530 08/09/23 0850 08/10/23 0354 08/11/23 0350  WBC 3.8* 15.0*   < >  14.6* 14.7*  --  12.9* 10.5 12.0*  NEUTROABS 1.7 12.6*  --   --   --   --   --   --   --   HGB 14.5  16.3 11.9*   < > 8.4* 8.5* 7.7* 8.0* 8.9* 8.6*  HCT 45.4  48.0 37.1*   < > 24.8* 24.7* 23.0* 24.5* 27.5* 25.8*  MCV 88.2 88.5   < > 83.8 83.4  --  85.7 87.6 86.3  PLT 108* 160   < > 141* 131*  --  124* 125* 157   < > = values in this interval not displayed.    Cardiac Enzymes: No results for input(s): "CKTOTAL", "CKMB", "CKMBINDEX", "TROPONINI" in the last 168 hours.  BNP: Invalid input(s): "POCBNP"  CBG: Recent Labs  Lab 08/10/23 1212 08/10/23 1603 08/10/23 1927 08/10/23 2337 08/11/23 0329  GLUCAP 190* 127* 188* 139* 180*    Microbiology: Results for orders placed or performed during the hospital encounter of 08/05/23  Culture, Respiratory w Gram Stain     Status: None   Collection Time: 08/07/23  8:09 AM   Specimen: Tracheal Aspirate;  Respiratory  Result Value Ref Range Status   Specimen Description   Final    TRACHEAL ASPIRATE Performed at [facility], [address], [place], [place] [address]    Special Requests   Final    NONE Performed at [facility], [address] [place], [place] [address]    Gram Stain   Final    FEW WBC PRESENT, PREDOMINANTLY MONONUCLEAR FEW GRAM POSITIVE COCCI IN CLUSTERS Performed at [facility], [address] [place], [place] [address]    Culture FEW METHICILLIN RESISTANT STAPHYLOCOCCUS AUREUS  Final   Report Status 08/10/2023 FINAL  Final   Organism ID, Bacteria METHICILLIN RESISTANT STAPHYLOCOCCUS AUREUS  Final      Susceptibility   Methicillin resistant staphylococcus aureus - MIC*    CIPROFLOXACIN >=8 RESISTANT Resistant     ERYTHROMYCIN >=8 RESISTANT Resistant     GENTAMICIN <=0.5 SENSITIVE Sensitive     OXACILLIN >=4 RESISTANT Resistant     TETRACYCLINE >=16 RESISTANT Resistant     VANCOMYCIN  1 SENSITIVE Sensitive     TRIMETH /SULFA  <=10 SENSITIVE Sensitive     CLINDAMYCIN >=8 RESISTANT Resistant     RIFAMPIN <=0.5 SENSITIVE Sensitive     Inducible Clindamycin NEGATIVE Sensitive     LINEZOLID  2 SENSITIVE Sensitive     * FEW METHICILLIN RESISTANT STAPHYLOCOCCUS AUREUS    Coagulation Studies: No results for input(s): "LABPROT", "INR" in the last 72 hours.   Urinalysis: No results for input(s): "COLORURINE", "LABSPEC", "PHURINE", "GLUCOSEU", "HGBUR", "BILIRUBINUR", "KETONESUR", "PROTEINUR", "UROBILINOGEN", "NITRITE", "LEUKOCYTESUR" in the last 72 hours.  Invalid input(s): "APPERANCEUR"    Imaging: DG Chest Port 1 View Result Date: 08/10/2023 CLINICAL DATA:  Pleural effusion. EXAM: PORTABLE CHEST 1 VIEW COMPARISON:  08/09/2023 FINDINGS: The cardio pericardial silhouette is enlarged. Basilar atelectasis bilaterally with improved aeration in the retrocardiac left base compared to prior. No acute bony abnormality. Telemetry leads overlie the  chest. Trace soft tissue gas seen in the right supraclavicular region previously has resolved. IMPRESSION: Basilar atelectasis bilaterally with improved aeration in the retrocardiac left base compared to prior. Electronically Signed   By: Donnal Fusi M.D.   On: 08/10/2023 07:34   ECHOCARDIOGRAM COMPLETE Result Date: 08/09/2023    ECHOCARDIOGRAM REPORT   Patient Name:   Adam Howell Date of Exam: 08/09/2023 Medical Rec #:  [id number]  Height:       72.0 in Accession #:    [id number]          Weight:       239.9 lb Date of Birth:  1960/07/20           BSA:          2.302 m Patient Age:    63 years            BP:           95/54 mmHg Patient Gender: M                   HR:           57 bpm. Exam Location:  [facility] Procedure: 2D Echo, Color Doppler and Cardiac Doppler (Both Spectral and Color            Flow Doppler were utilized during procedure). Indications:     R57.9 Shock  History:         Patient has prior history of Echocardiogram examinations. ESRD,                  Arrythmias:Atrial Fibrillation; Risk Factors:Diabetes,                  Hypertension and Dyslipidemia.  Sonographer:     L. Thornton-Maynard Referring Phys:  [id number] Delanna Fears Diagnosing Phys: Adam Mass MD IMPRESSIONS  1. Left ventricular ejection fraction, by estimation, is 55 to 60%. The left ventricle has normal function. The left ventricle has no regional wall motion abnormalities. There is severe concentric left ventricular hypertrophy. Left ventricular diastolic  function could not be evaluated.  2. Right ventricular systolic function is moderately reduced. The right ventricular size is moderately enlarged. There is mildly elevated pulmonary artery systolic pressure. The estimated right ventricular systolic pressure is 41.4 mmHg.  3. Left atrial size was severely dilated.  4. Right atrial size was moderately dilated.  5. The mitral valve is grossly normal. Trivial mitral valve regurgitation. No evidence of mitral  stenosis.  6. The aortic valve is tricuspid. Aortic valve regurgitation is not visualized. Aortic valve sclerosis/calcification is present, without any evidence of aortic stenosis.  7. The inferior vena cava is normal in size with greater than 50% respiratory variability, suggesting right atrial pressure of 3 mmHg. Comparison(s): Changes from prior study are noted. Severe concentric LVH up to 2.0 cm. LVEF is normal and improved from prior. Moderate RV dilation with moderately reduced function. Severe LVH may be related to hypertensive heart disease per chart review, but would consider work-up for infiltrative cardiomyopathy such as cardiac amyloidosis. FINDINGS  Left Ventricle: Left ventricular ejection fraction, by estimation, is 55 to 60%. The left ventricle has normal function. The left ventricle has no regional wall motion abnormalities. The left ventricular internal cavity size was normal in size. There is  severe concentric left ventricular hypertrophy. Left ventricular diastolic function could not be evaluated due to atrial fibrillation. Left ventricular diastolic function could not be evaluated. Right Ventricle: The right ventricular size is moderately enlarged. No increase in right ventricular wall thickness. Right ventricular systolic function is moderately reduced. There is mildly elevated pulmonary artery systolic pressure. The tricuspid regurgitant velocity is 3.10 m/s, and with an assumed right atrial pressure of 3 mmHg, the estimated right ventricular systolic pressure is 41.4 mmHg. Left Atrium: Left atrial size was severely dilated. Right Atrium: Right atrial size was moderately dilated. Pericardium: Trivial pericardial effusion is present. Mitral  Valve: The mitral valve is grossly normal. Trivial mitral valve regurgitation. No evidence of mitral valve stenosis. Tricuspid Valve: The tricuspid valve is grossly normal. Tricuspid valve regurgitation is mild . No evidence of tricuspid stenosis. Aortic  Valve: The aortic valve is tricuspid. Aortic valve regurgitation is not visualized. Aortic valve sclerosis/calcification is present, without any evidence of aortic stenosis. Aortic valve mean gradient measures 4.7 mmHg. Aortic valve peak gradient measures 9.7 mmHg. Aortic valve area, by VTI measures 1.92 cm. Pulmonic Valve: The pulmonic valve was grossly normal. Pulmonic valve regurgitation is not visualized. No evidence of pulmonic stenosis. Aorta: The aortic root and ascending aorta are structurally normal, with no evidence of dilitation. Venous: The inferior vena cava is normal in size with greater than 50% respiratory variability, suggesting right atrial pressure of 3 mmHg. IAS/Shunts: The atrial septum is grossly normal.  LEFT VENTRICLE PLAX 2D LVIDd:         4.00 cm     Diastology LVIDs:         3.00 cm     LV e' medial:    5.19 cm/s LV PW:         2.00 cm     LV E/e' medial:  13.0 LV IVS:        2.00 cm     LV e' lateral:   5.43 cm/s LVOT diam:     2.20 cm     LV E/e' lateral: 12.4 LV SV:         41 LV SV Index:   18 LVOT Area:     3.80 cm  LV Volumes (MOD) LV vol d, MOD A2C: 57.9 ml LV vol d, MOD A4C: 62.5 ml LV vol s, MOD A2C: 21.8 ml LV vol s, MOD A4C: 19.0 ml LV SV MOD A2C:     36.1 ml LV SV MOD A4C:     62.5 ml LV SV MOD BP:      39.9 ml RIGHT VENTRICLE             IVC RV Basal diam:  4.00 cm     IVC diam: 1.30 cm RV S prime:     10.60 cm/s TAPSE (M-mode): 1.0 cm LEFT ATRIUM              Index        RIGHT ATRIUM           Index LA diam:        4.80 cm  2.09 cm/m   RA Area:     25.80 cm LA Vol (A2C):   147.0 ml 63.87 ml/m  RA Volume:   87.30 ml  37.93 ml/m LA Vol (A4C):   113.0 ml 49.10 ml/m LA Biplane Vol: 131.0 ml 56.92 ml/m  AORTIC VALVE                    PULMONIC VALVE AV Area (Vmax):    1.95 cm     PV Vmax:       1.19 m/s AV Area (Vmean):   2.03 cm     PV Peak grad:  5.7 mmHg AV Area (VTI):     1.92 cm AV Vmax:           155.33 cm/s AV Vmean:          97.967 cm/s AV VTI:            0.214  m AV Peak Grad:      9.7 mmHg AV  Mean Grad:      4.7 mmHg LVOT Vmax:         79.80 cm/s LVOT Vmean:        52.300 cm/s LVOT VTI:          0.108 m LVOT/AV VTI ratio: 0.50  AORTA Ao Root diam: 3.40 cm Ao Asc diam:  3.20 cm MITRAL VALVE               TRICUSPID VALVE MV Area (PHT): 3.06 cm    TR Peak grad:   38.4 mmHg MV Decel Time: 248 msec    TR Vmax:        310.00 cm/s MV E velocity: 67.30 cm/s                            SHUNTS                            Systemic VTI:  0.11 m                            Systemic Diam: 2.20 cm Adam Mass MD Electronically signed by Adam Mass MD Signature Date/Time: 08/09/2023/2:02:31 PM    Final      Medications:      amiodarone   200 mg Oral Daily   vitamin C   500 mg Oral BID   Chlorhexidine  Gluconate Cloth  6 each Topical Q0600   docusate sodium   100 mg Oral BID   epoetin  alfa-epbx (RETACRIT ) injection  10,000 Units Intravenous Q M,W,F-1800   feeding supplement (NEPRO CARB STEADY)  237 mL Oral TID BM   hydrocerin   Topical Daily   hydrocortisone  sod succinate (SOLU-CORTEF ) inj  50 mg Intravenous Q12H   insulin  aspart  0-9 Units Subcutaneous Q4H   iron  polysaccharides  150 mg Oral Daily   ketotifen   1 drop Both Eyes BID   lidocaine   5 mL Intradermal Once   midodrine   10 mg Oral TID   multivitamin  1 tablet Oral QHS   polyethylene glycol  17 g Oral Daily   rosuvastatin   40 mg Oral Daily   zinc  sulfate (50mg  elemental zinc )  220 mg Oral Daily   alum & mag hydroxide-simeth, guaiFENesin -dextromethorphan, melatonin, ondansetron , mouth rinse, oxyCODONE -acetaminophen , phenol, potassium chloride   Assessment/ Plan:  Adam Howell is a 63 y.o.  male with end stage renal disease on hemodialysis, diabetes, hypertension, CAD, Rt AKA, anemia, CHF who was admitted to [facility] on 08/05/2023 for revision of AVF. Patient had anaphylactic reaction and admitted to ICU.   [facility]/MWF/left AVF  End-stage renal disease with hyperkalemia on hemodialysis.   With complication of hemodialysis access.  - appreciate vascular input. Will need to assess to see if his AVF can be salvaged. If not, patient will need a tunneled catheter. - Receiving dialysis today, UF reduced to 2L due to hypotension.  - Vascular surgery confirms Rt femoral permcath - Next treatment scheduled for Friday.   2. Anemia of chronic kidney disease Lab Results  Component Value Date   HGB 8.6 (L) 08/11/2023    Continue Epogen  10,000 units IV with dialysis treatments.  3. Secondary Hyperparathyroidism: with outpatient labs: PTH 410, phosphorus 4.7, calcium  8.8 on 07/19/23.    Lab Results  Component Value Date   CALCIUM  8.1 (L) 08/11/2023   CAION 1.06 (L) 08/05/2023   PHOS 2.7  08/11/2023    Bone minerals acceptable, binders remain held.   4. Hypotension: with recent systemic shock.   - Now off vasopressors.  - Blood pressure soft, 97/72 during dialysis.   LOS: 6 Adam Howell 5/28/202510:43 AM

## 2023-08-11 NOTE — Progress Notes (Signed)
 Progress Note   Patient: Adam Howell ZOX:[id number] DOB: 1960/04/18 DOA: 08/05/2023     6 DOS: the patient was seen and examined on 08/11/2023   Brief hospital course: Orest Dygert is a 63 y.o. male with past medical history significant for ESRD on HD, HFrEF, hypertension, hyperlipidemia, who presented to the hospital for elective AV fistula revision.   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  The patient became hypotensive with SBP in the 60's concerning for bleeding in the diseased central venous system verses possible IV contrast reaction.  Fluroscopy did show some blood in the pleural cavity concerning for hemothorax.     Femoral temporary Dialysis catheter was placed along with femoral central line placement. Consideration was given for placing a chest tube, but it was felt this may result in increased bleeding as the pressure from the existing hematoma would help tamponade any further venous bleeding. Also of note, the patient had been on Eliquis  up till the day prior to this event and had received heparin  during the revision of his fistula.      Pt remained intubated post procedure requiring vasopressors, and was transferred to ICU.  A chest tube was placed on 5/23 for increased hemothorax, which was removed on 5/25.  Extubated on 5/26.    Principal Problem:   ESRD (end stage renal disease) (HCC) Active Problems:   Type 2 diabetes mellitus with ESRD (end-stage renal disease) (HCC)   Atrial fibrillation, chronic (HCC)   Anemia in chronic kidney disease   Obesity (BMI 30-39.9)   Peripheral vascular disease (HCC)   Chronic pain syndrome   Heart failure with reduced ejection fraction (HCC)   Hypertension   S/P AKA (above knee amputation), right (HCC)   Pressure injury of skin   Shock circulatory (HCC)   Assessment and Plan: Anaphylactic reaction versus  hemorrhagic  shock. Right hemothorax. Acute blood loss anemia. This happened while placing a dialysis catheter.  Condition has improved.  Acute hypoxemic respiratory failure. Condition also improved.  End-stage renal disease on hemodialysis  Hyponatremia. Patient currently has a right femoral cath for dialysis.  Still need a permacath placement.  Followed by nephrology.  Chronic atrial fibrillation. Eliquis  on hold due to recent hemorrhagic shock.  Chronic diastolic congestive heart failure Severe LV hypertrophy. Currently, patient does not have volume overload.  Type 2 diabetes. Continue sliding scale insulin .       Subjective:  Patient doing well, no short of breath.  Physical Exam: Vitals:   08/11/23 1130 08/11/23 1136 08/11/23 1154 08/11/23 1240  BP: 112/67 105/76  (!) 102/59  Pulse: 65 69  67  Resp: (!) 23 18  16   Temp:  98.5 F (36.9 C)  98.5 F (36.9 C)  TempSrc:  Oral    SpO2: 100% 98%  99%  Weight:   97.3 kg   Height:       General exam: Appears calm and comfortable  Respiratory system: Clear to auscultation. Respiratory effort normal. Cardiovascular system: Irregular. No JVD, murmurs, rubs, gallops or clicks. No pedal edema. Gastrointestinal system: Abdomen is nondistended, soft and nontender. No organomegaly or masses felt. Normal bowel sounds heard. Central nervous system: Alert and oriented. No focal neurological deficits. Extremities: Right AKA. Skin: No rashes, lesions or ulcers Psychiatry: Judgement and insight appear normal. Mood & affect appropriate.    Data Reviewed:  Lab results reviewed.  Family Communication: None  Disposition: Status is: Inpatient Remains inpatient appropriate because: Severity of disease, inpatient procedure, also pending nursing home placement.     Time spent: 50 minutes  Author: Donaciano Frizzle, MD 08/11/2023 1:38 PM  For on call review [email or web address].

## 2023-08-11 NOTE — Progress Notes (Signed)
 Occupational Therapy Treatment Patient Details Name: Adam Howell MRN: [id number] DOB: 1960/09/11 Today's Date: 08/11/2023   History of present illness Pt is a 63 y/o M admitted on 08/05/23 after presenting for elective AV fistula revision. PermCath placement was complicated by innominate vein perforation with large R hemothorax & concern for potential anaphylactic reaction to IV contrast & shock. PMH: ESRD on HD, HFrEF, HTN, HLD, AKA   OT comments  Pt is supine in bed on arrival. Reports being fatigued after dialysis and declines transfers to W/C, but is agreeable to sponge bathing. He did not report any pain during session. Pt performed supine to sit at EOB with Min A to assist in trunkal elevation to reach upright position at EOB. Pt required CGA progressing to supervision for seated balance at EOB once able to scoot forward. Set up to supervision for UB bathing and grooming tasks seated at EOB. Returned to supine with CGA and able to perform anterior hygiene/bathing at bed level with set up, then able to roll with supervision using bed rails for Min/Mod A for LB bathing of buttocks. He was left in bed with all needs in place and will cont to require skilled acute OT services to maximize his safety and IND to return to PLOF.       If plan is discharge home, recommend the following:  A little help with walking and/or transfers;A lot of help with bathing/dressing/bathroom   Equipment Recommendations  Other (comment) (defer)    Recommendations for Other Services      Precautions / Restrictions Precautions Precautions: Fall Recall of Precautions/Restrictions: Intact Precaution/Restrictions Comments: temp femoral HD cath Restrictions Weight Bearing Restrictions Per Provider Order: No       Mobility Bed Mobility Overal bed mobility: Needs Assistance Bed Mobility: Supine to Sit, Sit to Supine, Rolling Rolling: Supervision, Used rails   Supine to sit: Used rails, HOB elevated,  Min assist Sit to supine: Contact guard assist, HOB elevated   General bed mobility comments: min A needed to advance trunk to upright position at EOB, increased time and effort overall from semi supine position    Transfers                         Balance Overall balance assessment: Needs assistance Sitting-balance support: Feet supported, Bilateral upper extremity supported Sitting balance-Leahy Scale: Fair Sitting balance - Comments: increased time to gain balance, better once scooted forward--CGA progressing to supervision during bathing                                   ADL either performed or assessed with clinical judgement   ADL Overall ADL's : Needs assistance/impaired     Grooming: Wash/dry hands;Wash/dry face;Set up;Supervision/safety;Sitting;Applying deodorant Grooming Details (indicate cue type and reason): EOB Upper Body Bathing: Supervision/ safety;Set up;Sitting Upper Body Bathing Details (indicate cue type and reason): EOB Lower Body Bathing: Minimal assistance;Moderate assistance;Sitting/lateral leans;Bed level Lower Body Bathing Details (indicate cue type and reason): assist for buttocks only, able to bathe anterior peri-region and BLEs at bed level and seated EOB Upper Body Dressing : Minimal assistance;Sitting Upper Body Dressing Details (indicate cue type and reason): exchange gowns after bath d/t managing tele Lower Body Dressing: Maximal assistance;Bed level Lower Body Dressing Details (indicate cue type and reason): to don mesh underwear-did not leave on d/t temp cath placement  Extremity/Trunk Assessment              Vision       Restaurant manager, fast food Communication: No apparent difficulties   Cognition Arousal: Alert Behavior During Therapy: WFL for tasks assessed/performed                                 Following commands: Intact,  Impaired Following commands impaired: Follows one step commands with increased time      Cueing   Cueing Techniques: Verbal cues  Exercises      Shoulder Instructions       General Comments VSS and femoral lines intact pre/post session    Pertinent Vitals/ Pain       Pain Assessment Pain Assessment: No/denies pain Pain Intervention(s): Monitored during session  Home Living                                          Prior Functioning/Environment              Frequency  Min 2X/week        Progress Toward Goals  OT Goals(current goals can now be found in the care plan section)  Progress towards OT goals: Progressing toward goals  Acute Rehab OT Goals Patient Stated Goal: improve strength to return to facility OT Goal Formulation: With patient Time For Goal Achievement: 08/24/23 Potential to Achieve Goals: Good  Plan      Co-evaluation                 AM-PAC OT "6 Clicks" Daily Activity     Outcome Measure   Help from another person eating meals?: None Help from another person taking care of personal grooming?: None Help from another person toileting, which includes using toliet, bedpan, or urinal?: A Lot Help from another person bathing (including washing, rinsing, drying)?: A Lot Help from another person to put on and taking off regular upper body clothing?: A Little Help from another person to put on and taking off regular lower body clothing?: A Little 6 Click Score: 18    End of Session    OT Visit Diagnosis: Other abnormalities of gait and mobility (R26.89);Muscle weakness (generalized) (M62.81)   Activity Tolerance Patient tolerated treatment well   Patient Left in bed;with call bell/phone within reach;with bed alarm set   Nurse Communication Mobility status        Time: 1417-1440 OT Time Calculation (min): 23 min  Charges: OT General Charges $OT Visit: 1 Visit OT Treatments $Self Care/Home Management : 23-37  mins  Ambrea Hegler, OTR/L  08/11/23, 3:13 PM   Heatherly Stenner E Nickoli Bagheri 08/11/2023, 3:11 PM

## 2023-08-12 ENCOUNTER — Encounter
Admission: AD | Disposition: A | Discharge status: Home / Self Care | Source: Home / Self Care | Attending: Vascular Surgery

## 2023-08-12 DIAGNOSIS — Z992 Dependence on renal dialysis: Secondary | ICD-10-CM | POA: Diagnosis not present

## 2023-08-12 DIAGNOSIS — T82898A Other specified complication of vascular prosthetic devices, implants and grafts, initial encounter: Secondary | ICD-10-CM | POA: Diagnosis not present

## 2023-08-12 DIAGNOSIS — N186 End stage renal disease: Secondary | ICD-10-CM | POA: Diagnosis not present

## 2023-08-12 DIAGNOSIS — T82868A Thrombosis of vascular prosthetic devices, implants and grafts, initial encounter: Secondary | ICD-10-CM | POA: Diagnosis not present

## 2023-08-12 DIAGNOSIS — I5020 Unspecified systolic (congestive) heart failure: Secondary | ICD-10-CM | POA: Diagnosis not present

## 2023-08-12 DIAGNOSIS — I482 Chronic atrial fibrillation, unspecified: Secondary | ICD-10-CM | POA: Diagnosis not present

## 2023-08-12 DIAGNOSIS — Z9889 Other specified postprocedural states: Secondary | ICD-10-CM

## 2023-08-12 DIAGNOSIS — T82858A Stenosis of vascular prosthetic devices, implants and grafts, initial encounter: Secondary | ICD-10-CM

## 2023-08-12 HISTORY — PX: A/V FISTULAGRAM: CATH118298

## 2023-08-12 LAB — CBC
HCT: 25.8 % — ABNORMAL LOW (ref 39.0–52.0)
Hemoglobin: 8.4 g/dL — ABNORMAL LOW (ref 13.0–17.0)
MCH: 28.2 pg (ref 26.0–34.0)
MCHC: 32.6 g/dL (ref 30.0–36.0)
MCV: 86.6 fL (ref 80.0–100.0)
Platelets: 176 10*3/uL (ref 150–400)
RBC: 2.98 MIL/uL — ABNORMAL LOW (ref 4.22–5.81)
RDW: 15.9 % — ABNORMAL HIGH (ref 11.5–15.5)
WBC: 11.8 10*3/uL — ABNORMAL HIGH (ref 4.0–10.5)
nRBC: 2.5 % — ABNORMAL HIGH (ref 0.0–0.2)

## 2023-08-12 LAB — RENAL FUNCTION PANEL
Albumin: 2.6 g/dL — ABNORMAL LOW (ref 3.5–5.0)
Anion gap: 11 (ref 5–15)
BUN: 36 mg/dL — ABNORMAL HIGH (ref 8–23)
CO2: 27 mmol/L (ref 22–32)
Calcium: 8.5 mg/dL — ABNORMAL LOW (ref 8.9–10.3)
Chloride: 98 mmol/L (ref 98–111)
Creatinine, Ser: 6.13 mg/dL — ABNORMAL HIGH (ref 0.61–1.24)
GFR, Estimated: 10 mL/min — ABNORMAL LOW (ref >60)
Glucose, Bld: 143 mg/dL — ABNORMAL HIGH (ref 70–99)
Phosphorus: 2.7 mg/dL (ref 2.5–4.6)
Potassium: 4 mmol/L (ref 3.5–5.1)
Sodium: 136 mmol/L (ref 135–145)

## 2023-08-12 LAB — GLUCOSE, CAPILLARY
Glucose-Capillary: 108 mg/dL — ABNORMAL HIGH (ref 70–99)
Glucose-Capillary: 114 mg/dL — ABNORMAL HIGH (ref 70–99)
Glucose-Capillary: 126 mg/dL — ABNORMAL HIGH (ref 70–99)
Glucose-Capillary: 138 mg/dL — ABNORMAL HIGH (ref 70–99)
Glucose-Capillary: 146 mg/dL — ABNORMAL HIGH (ref 70–99)
Glucose-Capillary: 148 mg/dL — ABNORMAL HIGH (ref 70–99)

## 2023-08-12 SURGERY — A/V FISTULAGRAM
Anesthesia: Moderate Sedation | Laterality: Left

## 2023-08-12 MED ORDER — FENTANYL CITRATE PF 50 MCG/ML IJ SOSY
PREFILLED_SYRINGE | INTRAMUSCULAR | Status: AC
  Filled 2023-08-12: qty 1

## 2023-08-12 MED ORDER — FENTANYL CITRATE (PF) 100 MCG/2ML IJ SOLN
INTRAMUSCULAR | Status: AC
  Filled 2023-08-12: qty 2

## 2023-08-12 MED ORDER — LIDOCAINE-EPINEPHRINE (PF) 1 %-1:200000 IJ SOLN
INTRAMUSCULAR | Status: DC | PRN
  Administered 2023-08-12: 5 mL via INTRADERMAL

## 2023-08-12 MED ORDER — METHYLPREDNISOLONE SODIUM SUCC 125 MG IJ SOLR: 125.0000 mg | Freq: Once | INTRAMUSCULAR | Status: DC | PRN

## 2023-08-12 MED ORDER — HEPARIN SODIUM (PORCINE) 1000 UNIT/ML IJ SOLN
INTRAMUSCULAR | Status: AC
  Filled 2023-08-12: qty 10

## 2023-08-12 MED ORDER — CEFAZOLIN SODIUM-DEXTROSE 1-4 GM/50ML-% IV SOLN
INTRAVENOUS | Status: AC
  Filled 2023-08-12: qty 50

## 2023-08-12 MED ORDER — CEFAZOLIN SODIUM-DEXTROSE 1-4 GM/50ML-% IV SOLN: 1.0000 g | INTRAVENOUS | Status: DC

## 2023-08-12 MED ORDER — FAMOTIDINE 20 MG PO TABS
ORAL_TABLET | ORAL | Status: AC
  Filled 2023-08-12: qty 2

## 2023-08-12 MED ORDER — HEPARIN SODIUM (PORCINE) 1000 UNIT/ML IJ SOLN
INTRAMUSCULAR | Status: DC | PRN
Start: 2023-08-12 — End: 2023-08-12
  Administered 2023-08-12: 4000 [IU] via INTRAVENOUS

## 2023-08-12 MED ORDER — APIXABAN 2.5 MG PO TABS
2.5000 mg | ORAL_TABLET | Freq: Two times a day (BID) | ORAL | Status: DC
  Administered 2023-08-13 – 2023-08-16 (×7): 2.5 mg via ORAL
  Filled 2023-08-12 (×8): qty 1

## 2023-08-12 MED ORDER — FAMOTIDINE 20 MG PO TABS
40.0000 mg | ORAL_TABLET | Freq: Once | ORAL | Status: AC | PRN
Start: 2023-08-12 — End: 2023-08-12
  Administered 2023-08-12: 40 mg via ORAL

## 2023-08-12 MED ORDER — SENNOSIDES-DOCUSATE SODIUM 8.6-50 MG PO TABS
2.0000 | ORAL_TABLET | Freq: Two times a day (BID) | ORAL | Status: DC
  Administered 2023-08-12: 2 via ORAL
  Filled 2023-08-12 (×6): qty 2

## 2023-08-12 MED ORDER — FENTANYL CITRATE (PF) 100 MCG/2ML IJ SOLN
INTRAMUSCULAR | Status: DC | PRN
  Administered 2023-08-12: 25 ug via INTRAVENOUS
  Administered 2023-08-12: 50 ug via INTRAVENOUS
  Administered 2023-08-12 (×4): 25 ug via INTRAVENOUS

## 2023-08-12 MED ORDER — MIDAZOLAM HCL 2 MG/ML PO SYRP
8.0000 mg | ORAL_SOLUTION | Freq: Once | ORAL | Status: DC | PRN
  Filled 2023-08-12: qty 5

## 2023-08-12 MED ORDER — DIPHENHYDRAMINE HCL 50 MG/ML IJ SOLN
INTRAMUSCULAR | Status: AC
  Filled 2023-08-12: qty 1

## 2023-08-12 MED ORDER — HEPARIN (PORCINE) IN NACL 1000-0.9 UT/500ML-% IV SOLN
INTRAVENOUS | Status: DC | PRN
Start: 2023-08-12 — End: 2023-08-12
  Administered 2023-08-12: 1000 mL

## 2023-08-12 MED ORDER — SODIUM CHLORIDE 0.9 % IV SOLN: INTRAVENOUS | Status: DC

## 2023-08-12 MED ORDER — DIPHENHYDRAMINE HCL 50 MG/ML IJ SOLN
50.0000 mg | Freq: Once | INTRAMUSCULAR | Status: AC | PRN
Start: 2023-08-12 — End: 2023-08-12
  Administered 2023-08-12: 50 mg via INTRAVENOUS

## 2023-08-12 MED ORDER — MIDAZOLAM HCL 5 MG/5ML IJ SOLN
INTRAMUSCULAR | Status: AC
  Filled 2023-08-12: qty 5

## 2023-08-12 MED ORDER — MIDAZOLAM HCL 2 MG/2ML IJ SOLN
INTRAMUSCULAR | Status: DC | PRN
Start: 2023-08-12 — End: 2023-08-12
  Administered 2023-08-12: .5 mg via INTRAVENOUS
  Administered 2023-08-12 (×2): 1 mg via INTRAVENOUS
  Administered 2023-08-12: .5 mg via INTRAVENOUS

## 2023-08-12 MED ORDER — IODIXANOL 320 MG/ML IV SOLN
INTRAVENOUS | Status: DC | PRN
Start: 2023-08-12 — End: 2023-08-12
  Administered 2023-08-12: 35 mL via INTRAVENOUS

## 2023-08-12 SURGICAL SUPPLY — 19 items
BALLOON LUTONIX 7X100X130 (BALLOONS) IMPLANT
BALLOON ULTRVRSE 7X200X75 (BALLOONS) IMPLANT
BALLOON ULTRVRSE 7X40X75C (BALLOONS) IMPLANT
CANISTER PENUMBRA ENGINE (MISCELLANEOUS) IMPLANT
CATH BEACON 5 .035 40 KMP TP (CATHETERS) IMPLANT
CATH INDIGO 7D KIT (CATHETERS) IMPLANT
CATH INDIGO SEP D (CATHETERS) IMPLANT
COVER PROBE ULTRASOUND 5X96 (MISCELLANEOUS) IMPLANT
DEVICE PRESTO INFLATION (MISCELLANEOUS) IMPLANT
DRAPE BRACHIAL (DRAPES) IMPLANT
GLIDEWIRE ADV .035X180CM (WIRE) IMPLANT
KIT MICROPUNCTURE VSI 5F STIFF (SHEATH) IMPLANT
PACK ANGIOGRAPHY (CUSTOM PROCEDURE TRAY) ×1 IMPLANT
SHEATH BRITE TIP 6FRX5.5 (SHEATH) IMPLANT
SHEATH BRITE TIP 7FRX5.5 (SHEATH) IMPLANT
STENT VIABAHN 8X10X120 (Permanent Stent) IMPLANT
STENT VIABAHN 8X2.5X120 (Permanent Stent) IMPLANT
SUT MNCRL AB 4-0 PS2 18 (SUTURE) IMPLANT
WIRE G V18X300CM (WIRE) IMPLANT

## 2023-08-12 NOTE — Progress Notes (Signed)
 PT Cancellation Note  Patient Details Name: Adam Howell MRN: [id number] DOB: Sep 09, 1960   Cancelled Treatment:     PT attempt. Pt unwilling to participate at this time." I just don't feel like it. I'm going to get my HD cath at 3pm." Author encouraged pt to participate but pt remained firm. Acute PT will continue to follow and progress as able per current POC.    Koleen Perna 08/12/2023, 2:05 PM

## 2023-08-12 NOTE — Progress Notes (Signed)
 Received report from dayshift IVT RN about IVT consult placed for [name] to remove femoral HDC. No MD order for removal noted. Patient underwent procedure today on his AV fistula and appears to be set to receive dialysis tomorrow through his [facility] Per policy, unit to remove CVC if MD order is present and IVT can assist if needed. At this time, consult will be completed. Patient has IV access noted.   Jessieca Rhem R Effrey Davidow, RN

## 2023-08-12 NOTE — Plan of Care (Signed)
  Problem: Activity: Goal: Ability to tolerate increased activity will improve Outcome: Progressing   Problem: Respiratory: Goal: Ability to maintain a clear airway and adequate ventilation will improve Outcome: Progressing   Problem: Education: Goal: Knowledge of General Education information will improve Description: Including pain rating scale, medication(s)/side effects and non-pharmacologic comfort measures Outcome: Progressing   Problem: Health Behavior/Discharge Planning: Goal: Ability to manage health-related needs will improve Outcome: Progressing   Problem: Nutrition: Goal: Adequate nutrition will be maintained Outcome: Progressing

## 2023-08-12 NOTE — Interval H&P Note (Signed)
 History and Physical Interval Note:  08/12/2023 2:46 PM  Adam Howell  has presented today for surgery, with the diagnosis of ESRD.  The various methods of treatment have been discussed with the patient and family. After consideration of risks, benefits and other options for treatment, the patient has consented to  Procedure(s): A/V Fistulagram (Left) as a surgical intervention.  The patient's history has been reviewed, patient examined, no change in status, stable for surgery.  I have reviewed the patient's chart and labs.  Questions were answered to the patient's satisfaction.     Marcayla Budge

## 2023-08-12 NOTE — Progress Notes (Signed)
 OT Cancellation Note  Patient Details Name: Adam Howell MRN: [id number] DOB: [birth year]   Cancelled Treatment:    Reason Eval/Treat Not Completed: Patient at procedure or test/ unavailable. Pt off unit at procedure during the time of OT treatment attempt. OT will re attempt on next available date/time.    Rosaria Common M.S. OTR/L  08/12/23, 2:46 PM

## 2023-08-12 NOTE — Op Note (Signed)
 [facility]    OPERATIVE NOTE   PROCEDURE: 1.  Ultrasound guidance for vascular access to the left radial artery distal to the fistula 2.  Left arm fistulagram  3.   Mechanical thrombectomy to the left radiocephalic AV fistula and Artegraft jump graft all the way up to the basilic vein in the upper arm with the Penumbra Cat 7D catheter 4.  Percutaneous transluminal angioplasty of both anastomosis of the Artegraft jump graft to the forearm cephalic vein as well as the basilic vein in the upper arm with 7 mm diameter angioplasty balloon 5.  Stent placement to the more proximal anastomosis of the Artegraft jump graft near the antecubital fossa with 8 mm diameter by 10 cm length Viabahn stent 6.  Stent placement to the distal anastomosis of the Artegraft jump graft near the wrist with 8 mm diameter by 2.5 cm length Viabahn stent  PRE-OPERATIVE DIAGNOSIS: 1. ESRD 2.  Thrombosed left radiocephalic arteriovenous fistula with Artegraft jump graft  POST-OPERATIVE DIAGNOSIS: same as above   SURGEON: Mikki Alexander, MD  ANESTHESIA: local with Moderate Conscious Sedation for 67 minutes using 3 mg of Versed  and 175 mcg of Fentanyl   ESTIMATED BLOOD LOSS: 400 cc cc  FINDING(S): Thrombosed left radiocephalic AV fistula and Artegraft jump graft with stenosis in the basilic vein outflow at the antecubital fossa  SPECIMEN(S):  None  CONTRAST: 35 cc  FLUORO TIME: 4.4 minutes  INDICATIONS: Patient is a 63 y.o.male who presents with a thrombosed left radiocephalic arteriovenous fistula with recent Artegraft jump graft.  The patient is scheduled for an attempted declot and fistulagram.  The patient is aware the risks include but are not limited to: bleeding, infection, thrombosis of the cannulated access, and possible anaphylactic reaction to the contrast.  The patient is aware of the risks of the procedure and elects to proceed forward.  DESCRIPTION: After full informed written  consent was obtained, the patient was brought back to the angiography suite and placed supine upon the angiography table.  The patient was connected to monitoring equipment. Moderate conscious sedation was administered during a face to face encounter with the patient throughout the procedure with my supervision of the RN administering medicines and monitoring the patient's vital signs, pulse oximetry, telemetry and mental status throughout from the start of the procedure until the patient was taken to the recovery room. The left arm was prepped and draped in the standard fashion for a percutaneous access intervention.  Under ultrasound guidance, the left radial artery distal to the fistula was accessed with a micropuncture needle and permanent images were performed.  The microwire was advanced and the needle was exchanged for the a microsheath.  I then upsized to a 7 Fr Sheath and imaging was performed.  Hand injections were completed to image the access including the central venous system. This demonstrated thrombosis of the AVF and in the Artegraft jump graft.  There was also stenosis of the outflow basilic vein in the upper arm of greater than 80%.  Based on the images, this patient will need extensive treatment to salvage the graft. I then gave the patient 4000 units of intravenous heparin .  I then used an advantage wire and crossed the fistula and Artegraft jump graft getting the wire up into the upper arm basilic vein which was the outflow in the upper arm.  I did not give tPA due to his recent surgery.  Multiple passes with the penumbra CAT 7D catheter were then used throughout  the left radiocephalic AV fistula and Artegraft jump graft.  This was advanced all the way up into the basilic vein in the upper arm.  A large amount of thrombus was removed.  There remained stenosis and thrombus at both anastomosis of the Artegraft jump graft as well as a high-grade stenosis of 80% or more in the basilic vein in the  distal upper arm which was the outflow.  I then made multiple inflations with a 7 mm diameter by 10 cm length angioplasty balloon to treat the basilic vein outflow in the upper arm, the forearm cephalic vein and the Artegraft jump graft.  There remains some stenosis and thrombus at the anastomosis at both locations but the basilic vein in the upper arm had less than 20% residual stenosis.  I then switched out for a V18 wire and an 8 mm diameter by 10 cm length Viabahn stent was placed across the anastomosis near the antecubital fossa to the Artegraft jump graft.  This was postdilated with a 7 mm balloon.  At the distal anastomosis near the wrist, an 8 mm diameter by 2.5 cm length Viabahn stent was selected and deployed taking care to be beyond the initial radiocephalic anastomosis although it was close.  This was postdilated with 7 mm balloon with excellent angiographic completion result and less than 10% residual stenosis in both locations after stent placement.  There was now a good thrill within the Artegraft jump graft and the fistula.   Based on the completion imaging, no further intervention is necessary.  The wire and balloon were removed from the sheath.  A 4-0 Monocryl purse-string suture was sewn around the sheath.  The sheath was removed while tying down the suture.  A sterile bandage was applied to the puncture site.  COMPLICATIONS: None  CONDITION: Stable   Mikki Alexander 08/12/2023 4:09 PM   This note was created with Dragon Medical transcription system. Any errors in dictation are purely unintentional.

## 2023-08-12 NOTE — Progress Notes (Addendum)
 Progress Note   Patient: Adam Howell ZOX:[id number] DOB: 10-04-1960 DOA: 08/05/2023     7 DOS: the patient was seen and examined on 08/12/2023   Brief hospital course: Adam Howell is a 63 y.o. male with past medical history significant for ESRD on HD, HFrEF, hypertension, hyperlipidemia, who presented to the hospital for elective AV fistula revision.   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  The patient became hypotensive with SBP in the 60's concerning for bleeding in the diseased central venous system verses possible IV contrast reaction.  Fluroscopy did show some blood in the pleural cavity concerning for hemothorax.     Femoral temporary Dialysis catheter was placed along with femoral central line placement. Consideration was given for placing a chest tube, but it was felt this may result in increased bleeding as the pressure from the existing hematoma would help tamponade any further venous bleeding. Also of note, the patient had been on Eliquis  up till the day prior to this event and had received heparin  during the revision of his fistula.      Pt remained intubated post procedure requiring vasopressors, and was transferred to ICU.  A chest tube was placed on 5/23 for increased hemothorax, which was removed on 5/25.  Extubated on 5/26.    Principal Problem:   ESRD (end stage renal disease) (HCC) Active Problems:   Type 2 diabetes mellitus with ESRD (end-stage renal disease) (HCC)   Atrial fibrillation, chronic (HCC)   Anemia in chronic kidney disease   Obesity (BMI 30-39.9)   Peripheral vascular disease (HCC)   Chronic pain syndrome   Heart failure with reduced ejection fraction (HCC)   Hypertension   S/P AKA (above knee amputation), right (HCC)   Pressure injury of skin   Shock circulatory (HCC)   Assessment and Plan: Anaphylactic reaction versus  hemorrhagic  shock. Right hemothorax. Acute blood loss anemia. This happened while placing a dialysis catheter.  Condition has improved.   Acute hypoxemic respiratory failure. Condition also improved.   End-stage renal disease on hemodialysis  Hyponatremia. Patient currently has a right femoral cath for dialysis.  Permacath placement is scheduled for today.   Chronic atrial fibrillation. Eliquis  on hold due to recent hemorrhagic shock. May consider restart Eliquis  after the permacath is placed.   Chronic diastolic congestive heart failure Severe LV hypertrophy. Currently, patient does not have volume overload.   Type 2 diabetes. Continue sliding scale insulin .         Subjective:  Patient doing well, no complaint.  Physical Exam: Vitals:   08/11/23 2053 08/11/23 2057 08/12/23 0346 08/12/23 0833  BP: 113/61 (!) 100/54 125/65 128/69  Pulse: 68 70 (!) 58 61  Resp: 15 16 17 16   Temp: 98.7 F (37.1 C) 98.4 F (36.9 C) 98 F (36.7 C) 98.2 F (36.8 C)  TempSrc: Oral Oral Oral Oral  SpO2: 100% 99%    Weight:      Height:       General exam: Appears calm and comfortable  Respiratory system: Clear to auscultation. Respiratory effort normal. Cardiovascular system: Irregular. No JVD, murmurs, rubs, gallops or clicks. No pedal edema. Gastrointestinal system: Abdomen is nondistended, soft and nontender. No organomegaly or masses felt. Normal bowel sounds heard. Central nervous system: Alert and oriented. No focal neurological deficits. Extremities: Post right AKA. Skin: No rashes, lesions or ulcers Psychiatry: Judgement and insight appear normal. Mood &  affect appropriate.    Data Reviewed:  There are no new results to review at this time.  Family Communication: None  Disposition: Status is: Inpatient Remains inpatient appropriate because: Per vascular     Time spent: 35 minutes  Author: Donaciano Frizzle, MD 08/12/2023 10:50 AM  For on call review [email or web address].

## 2023-08-12 NOTE — [place] FL2 (Signed)
   MEDICAID [id number] LEVEL OF CARE FORM     IDENTIFICATION  Patient Name: Adam Howell Birthdate: January 12, 1961 Sex: male Admission Date (Current Location): 08/05/2023  [place] and [place] Number:  Chiropodist and Address:  [facility], [address], [place], [place] [address]      Provider Number: [id number]  Attending Physician Name and Address:  Celso College, MD  Relative Name and Phone Number:  [name] (Daughter)  [phone] (Mobile)    Current Level of Care: Hospital Recommended Level of Care: Skilled Nursing Facility Prior Approval Number:    Date Approved/Denied:   PASRR Number: [id number]  Discharge Plan: SNF    Current Diagnoses: Patient Active Problem List   Diagnosis Date Noted   Pressure injury of skin 08/09/2023   Shock circulatory (HCC) 08/09/2023   ESRD (end stage renal disease) (HCC) 08/05/2023   Infected ulcer of skin (HCC) 02/09/2023   Ulcer of left lower extremity, limited to breakdown of skin (HCC) 12/10/2022   S/P AKA (above knee amputation), right (HCC) 03/15/2022   Hypotension 03/08/2022   Wound infection of chronic right lower extremity ulcer 01/13/2022   Controlled substance agreement signed 04/10/2021   Other thrombophilia (HCC) 01/19/2020   Left leg pain 12/25/2019   Atrial fibrillation, chronic (HCC) 11/11/2019   Hypertension 11/11/2019   Hyperlipidemia associated with type 2 diabetes mellitus (HCC) 11/11/2019   Erectile dysfunction 11/11/2019   Heart failure with reduced ejection fraction (HCC) 10/26/2019   Type 2 diabetes mellitus with ESRD (end-stage renal disease) (HCC) 10/04/2019   Chronic pain syndrome 10/04/2019   Insomnia 10/04/2019   Atherosclerotic heart disease of native coronary artery without angina pectoris 09/01/2019   Hypertensive heart and kidney disease with heart failure and chronic kidney disease stage V (HCC) 09/01/2019   Obesity (BMI 30-39.9) 09/01/2019   Anemia  in chronic kidney disease 07/25/2019   ESRD on dialysis (HCC) 07/25/2019   Peripheral vascular disease (HCC) 07/25/2019   Secondary hyperparathyroidism of renal origin (HCC) 07/25/2019    Orientation RESPIRATION BLADDER Height & Weight     Self, Time, Situation, Place  Normal Continent Weight: 214 lb 8.1 oz (97.3 kg) Height:  6' (182.9 cm)  BEHAVIORAL SYMPTOMS/MOOD NEUROLOGICAL BOWEL NUTRITION STATUS   (None)  (None) Incontinent Diet (Renal with fluid restriction)  AMBULATORY STATUS COMMUNICATION OF NEEDS Skin   Extensive Assist Verbally PU Stage and Appropriate Care, Surgical wounds (Incisions on left arm (dermabond), right neck (no dressing), right groin (gauze prn), left groin ("dressing").)   PU Stage 2 Dressing:  (Left ankle: Foam daily. Left foot: Bismuth petroleum daily.)                   Personal Care Assistance Level of Assistance  Bathing, Dressing, Feeding Bathing Assistance: Maximum assistance Feeding assistance: Limited assistance Dressing Assistance: Maximum assistance     Functional Limitations Info  Sight, Hearing, Speech Sight Info: Adequate Hearing Info: Adequate Speech Info: Adequate    SPECIAL CARE FACTORS FREQUENCY  PT (By licensed PT), OT (By licensed OT)     PT Frequency: 5 x week OT Frequency: 5 x week            Contractures Contractures Info: Not present    Additional Factors Info  Code Status, Allergies, Isolation Precautions Code Status Info: Full code Allergies Info: Ivp Dye (Iodinated Contrast Media), Tramadol      Isolation Precautions Info: Contact: VRE, MRSA     Current Medications (08/12/2023):  This is the current  hospital active medication list Current Facility-Administered Medications  Medication Dose Route Frequency Provider Last Rate Last Admin   [MAR Hold] alum & mag hydroxide-simeth (MAALOX/MYLANTA) 200-200-20 MG/5ML suspension 15-30 mL  15-30 mL Oral Q2H PRN Dew, Jason S, MD       [MAR Hold] amiodarone  (PACERONE )  tablet 200 mg  200 mg Oral Daily Dew, Jason S, MD   200 mg at 08/12/23 1108   [START ON 08/13/2023] apixaban  (ELIQUIS ) tablet 2.5 mg  2.5 mg Oral BID Dew, Jason S, MD       [MAR Hold] ascorbic acid (VITAMIN C) tablet 500 mg  500 mg Oral BID Dew, Jason S, MD   500 mg at 08/12/23 1107   [MAR Hold] Chlorhexidine  Gluconate Cloth 2 % PADS 6 each  6 each Topical Q0600 Celso College, MD   6 each at 08/12/23 0429   [facility] Hold] epoetin  alfa-epbx (RETACRIT ) injection 10,000 Units  10,000 Units Intravenous Q M,W,F-1800 Dew, Jason S, MD   10,000 Units at 08/11/23 1035   [MAR Hold] feeding supplement (NEPRO CARB STEADY) liquid 237 mL  237 mL Oral TID BM Dew, Jason S, MD   237 mL at 08/11/23 1348   [MAR Hold] guaiFENesin -dextromethorphan (ROBITUSSIN DM) 100-10 MG/5ML syrup 15 mL  15 mL Oral Q4H PRN Dew, Jason S, MD       [MAR Hold] hydrocerin (EUCERIN) cream   Topical Daily Celso College, MD   Given at 08/11/23 1309   [MAR Hold] hydrocortisone sodium succinate (SOLU-CORTEF) 100 MG injection 50 mg  50 mg Intravenous Q12H Dew, Jason S, MD   50 mg at 08/12/23 1109   [MAR Hold] insulin  aspart (novoLOG ) injection 0-9 Units  0-9 Units Subcutaneous Q4H Dew, Jason S, MD   1 Units at 08/12/23 0446   [MAR Hold] iron  polysaccharides (NIFEREX) capsule 150 mg  150 mg Oral Daily Dew, Jason S, MD   150 mg at 08/12/23 1108   [MAR Hold] ketotifen (ZADITOR) 0.035 % ophthalmic solution 1 drop  1 drop Both Eyes BID Dew, Jason S, MD   1 drop at 08/11/23 2139   [facility] Hold] lidocaine  (XYLOCAINE ) 1 % (with pres) injection 5 mL  5 mL Intradermal Once Dew, Jason S, MD       [facility] Hold] melatonin tablet 5 mg  5 mg Oral QHS PRN Dew, Jason S, MD   5 mg at 08/11/23 2143   [facility] Hold] midodrine (PROAMATINE) tablet 10 mg  10 mg Oral TID Dew, Jason S, MD   10 mg at 08/12/23 1257   [MAR Hold] multivitamin (RENA-VIT) tablet 1 tablet  1 tablet Oral QHS Dew, Jason S, MD   1 tablet at 08/11/23 2139   [facility] Hold] ondansetron  (ZOFRAN ) injection 4 mg  4 mg  Intravenous Q6H PRN Dew, Jason S, MD       [MAR Hold] Oral care mouth rinse  15 mL Mouth Rinse PRN Dew, Donald Frost, MD       [MAR Hold] oxyCODONE -acetaminophen  (PERCOCET/ROXICET) 5-325 MG per tablet 1-2 tablet  1-2 tablet Oral Q4H PRN Dew, Jason S, MD   2 tablet at 08/11/23 2143   [MAR Hold] phenol (CHLORASEPTIC) mouth spray 1 spray  1 spray Mouth/Throat PRN Dew, Jason S, MD       [MAR Hold] polyethylene glycol (MIRALAX  / GLYCOLAX ) packet 17 g  17 g Oral Daily Dew, Jason S, MD   17 g at 08/10/23 0322   [facility] Hold] potassium chloride SA (KLOR-CON M) CR tablet  20-40 mEq  20-40 mEq Oral Once PRN Dew, Jason S, MD       [MAR Hold] rosuvastatin  (CRESTOR ) tablet 40 mg  40 mg Oral Daily Dew, Jason S, MD   40 mg at 08/12/23 1107   [MAR Hold] senna-docusate (Senokot-S) tablet 2 tablet  2 tablet Oral BID Dew, Jason S, MD       [MAR Hold] zinc sulfate (50mg  elemental zinc) capsule 220 mg  220 mg Oral Daily Dew, Jason S, MD   220 mg at 08/12/23 1108     Discharge Medications: Please see discharge summary for a list of discharge medications.  Relevant Imaging Results:  Relevant Lab Results:   Additional Information SS#: [id number]. HD MWF [facility].  Odilia Bennett, LCSW

## 2023-08-12 NOTE — Progress Notes (Signed)
 [facility]  ROUNDING NOTE   Subjective:   Adam Howell is a 64 y.o male with past medical history of diabetes, hypertension, CAD, Rt AKA, anemia, CHF, and ESRD on dialysis. Patient presents to vascular surgery for revision of AVF. He is being admitted for End stage renal disease (HCC) [N18.6] ESRD (end stage renal disease) (HCC) [N18.6]  Patient had anaphylactic shock prior to his procedure.   Patient had a right femoral temp HD catheter placed and receiving dialysis.   Update:   Patient resting quietly NPO for vascular procedure   Objective:  Vital signs in last 24 hours:  Temp:  [98 F (36.7 C)-98.7 F (37.1 C)] 98.2 F (36.8 C) (05/29 0833) Pulse Rate:  [58-70] 61 (05/29 0833) Resp:  [15-17] 16 (05/29 0833) BP: (100-128)/(54-69) 128/69 (05/29 0833) SpO2:  [99 %-100 %] 99 % (05/28 2057)  Weight change:  Filed Weights   08/09/23 1920 08/11/23 0809 08/11/23 1154  Weight: 101.2 kg 100 kg 97.3 kg    Intake/Output: I/O last 3 completed shifts: In: 240 [P.O.:240] Out: 2000 [Other:2000]   Intake/Output this shift:  No intake/output data recorded.  Physical Exam: General: No acute distress  Head: [place]/AT, hearing intact  Eyes: Anicteric  Lungs:  Clear bilateral  Heart: Regular rate and rhythm  Abdomen:  Soft, nontender, obese  Extremities: No peripheral edema  Right AKA  Neurologic: Alert and oriented  Skin: No lesions  Access: Left aVF (no bruit or thrill) right HD femoral permcath    Basic Metabolic Panel: Recent Labs  Lab 08/08/23 0307 08/09/23 0208 08/10/23 0354 08/11/23 0350 08/12/23 0450  NA 133* 132* 135 133* 136  K 3.8 4.0 3.7 3.9 4.0  CL 93* 92* 95* 93* 98  CO2 27 26 28 27 27   GLUCOSE 135* 149* 144* 204* 143*  BUN 26* 48* 35* 53* 36*  CREATININE 5.26* 7.38* 5.23* 7.63* 6.13*  CALCIUM  8.4* 8.1* 8.3* 8.1* 8.5*  MG 1.9 2.4  --   --   --   PHOS 4.4 4.3 2.8 2.7 2.7    Liver Function Tests: Recent Labs  Lab 08/05/23 1305  08/06/23 0549 08/07/23 0348 08/08/23 0307 08/10/23 0354 08/11/23 0350 08/12/23 0450  AST 17  --   --   --   --   --   --   ALT 17  --   --   --   --   --   --   ALKPHOS 84  --   --   --   --   --   --   BILITOT 0.8  --   --   --   --   --   --   PROT 6.5  --   --   --   --   --   --   ALBUMIN  3.2*   < > 2.8* 2.9* 2.8* 2.6* 2.6*   < > = values in this interval not displayed.   No results for input(s): "LIPASE", "AMYLASE" in the last 168 hours. No results for input(s): "AMMONIA" in the last 168 hours.  CBC: Recent Labs  Lab 08/05/23 1305 08/05/23 2143 08/08/23 0307 08/08/23 1530 08/09/23 0850 08/10/23 0354 08/11/23 0350 08/12/23 0450  WBC 15.0*   < > 14.7*  --  12.9* 10.5 12.0* 11.8*  NEUTROABS 12.6*  --   --   --   --   --   --   --   HGB 11.9*   < > 8.5* 7.7*  8.0* 8.9* 8.6* 8.4*  HCT 37.1*   < > 24.7* 23.0* 24.5* 27.5* 25.8* 25.8*  MCV 88.5   < > 83.4  --  85.7 87.6 86.3 86.6  PLT 160   < > 131*  --  124* 125* 157 176   < > = values in this interval not displayed.    Cardiac Enzymes: No results for input(s): "CKTOTAL", "CKMB", "CKMBINDEX", "TROPONINI" in the last 168 hours.  BNP: Invalid input(s): "POCBNP"  CBG: Recent Labs  Lab 08/11/23 1552 08/11/23 1803 08/12/23 0031 08/12/23 0336 08/12/23 0832  GLUCAP 191* 143* 148* 146* 114*    Microbiology: Results for orders placed or performed during the hospital encounter of 08/05/23  Culture, Respiratory w Gram Stain     Status: None   Collection Time: 08/07/23  8:09 AM   Specimen: Tracheal Aspirate; Respiratory  Result Value Ref Range Status   Specimen Description   Final    TRACHEAL ASPIRATE Performed at [facility], [address] [place], [place] [address]    Special Requests   Final    NONE Performed at [facility], [address] [place], [place] [address]    Gram Stain   Final    FEW WBC PRESENT, PREDOMINANTLY MONONUCLEAR FEW GRAM POSITIVE COCCI IN CLUSTERS Performed at  [facility], [address] [place], [place] [address]    Culture FEW METHICILLIN RESISTANT STAPHYLOCOCCUS AUREUS  Final   Report Status 08/10/2023 FINAL  Final   Organism ID, Bacteria METHICILLIN RESISTANT STAPHYLOCOCCUS AUREUS  Final      Susceptibility   Methicillin resistant staphylococcus aureus - MIC*    CIPROFLOXACIN >=8 RESISTANT Resistant     ERYTHROMYCIN >=8 RESISTANT Resistant     GENTAMICIN <=0.5 SENSITIVE Sensitive     OXACILLIN >=4 RESISTANT Resistant     TETRACYCLINE >=16 RESISTANT Resistant     VANCOMYCIN  1 SENSITIVE Sensitive     TRIMETH /SULFA  <=10 SENSITIVE Sensitive     CLINDAMYCIN >=8 RESISTANT Resistant     RIFAMPIN <=0.5 SENSITIVE Sensitive     Inducible Clindamycin NEGATIVE Sensitive     LINEZOLID  2 SENSITIVE Sensitive     * FEW METHICILLIN RESISTANT STAPHYLOCOCCUS AUREUS    Coagulation Studies: No results for input(s): "LABPROT", "INR" in the last 72 hours.   Urinalysis: No results for input(s): "COLORURINE", "LABSPEC", "PHURINE", "GLUCOSEU", "HGBUR", "BILIRUBINUR", "KETONESUR", "PROTEINUR", "UROBILINOGEN", "NITRITE", "LEUKOCYTESUR" in the last 72 hours.  Invalid input(s): "APPERANCEUR"    Imaging: DG Chest Port 1 View Result Date: 08/11/2023 CLINICAL DATA:  Hemodialysis fatigue EXAM: PORTABLE CHEST 1 VIEW COMPARISON:  08/10/2023 FINDINGS: Cardiomegaly. No acute airspace disease, pleural effusion or pneumothorax. Minimal atelectasis left base. IMPRESSION: No active disease. Cardiomegaly. Electronically Signed   By: Esmeralda Hedge M.D.   On: 08/11/2023 18:59     Medications:      amiodarone   200 mg Oral Daily   vitamin C  500 mg Oral BID   Chlorhexidine  Gluconate Cloth  6 each Topical Q0600   epoetin  alfa-epbx (RETACRIT ) injection  10,000 Units Intravenous Q M,W,F-1800   feeding supplement (NEPRO CARB STEADY)  237 mL Oral TID BM   hydrocerin   Topical Daily   hydrocortisone sod succinate (SOLU-CORTEF) inj  50 mg Intravenous Q12H   insulin   aspart  0-9 Units Subcutaneous Q4H   iron  polysaccharides  150 mg Oral Daily   ketotifen  1 drop Both Eyes BID   lidocaine   5 mL Intradermal Once   midodrine  10 mg Oral  TID   multivitamin  1 tablet Oral QHS   polyethylene glycol  17 g Oral Daily   rosuvastatin   40 mg Oral Daily   senna-docusate  2 tablet Oral BID   zinc sulfate (50mg  elemental zinc)  220 mg Oral Daily   alum & mag hydroxide-simeth, guaiFENesin -dextromethorphan, melatonin, ondansetron , mouth rinse, oxyCODONE -acetaminophen , phenol, potassium chloride  Assessment/ Plan:  [name] is a 63 y.o.  male with end stage renal disease on hemodialysis, diabetes, hypertension, CAD, Rt AKA, anemia, CHF who was admitted to [facility] on 08/05/2023 for revision of AVF. Patient had anaphylactic reaction and admitted to ICU.   [facility] [place]/MWF/left AVF  End-stage renal disease with hyperkalemia on hemodialysis.  With complication of hemodialysis access.  - appreciate vascular input. Will need to assess to see if his AVF can be salvaged. If not, patient will need a tunneled catheter. - Dialysis received yesterday with UF 2L achieved. Next treatment scheduled for Friday.  - Vascular surgery confirms Rt femoral permcath. Will perform fistulagram later today.   2. Anemia of chronic kidney disease Lab Results  Component Value Date   HGB 8.4 (L) 08/12/2023   Hgb decreased. Continue Epogen  10,000 units IV with dialysis treatments.  3. Secondary Hyperparathyroidism: with outpatient labs: PTH 410, phosphorus 4.7, calcium  8.8 on 07/19/23.    Lab Results  Component Value Date   CALCIUM  8.5 (L) 08/12/2023   CAION 1.06 (L) 08/05/2023   PHOS 2.7 08/12/2023    Bone minerals acceptable, binders remain held.   4. Hypotension: with recent systemic shock.   - Now off vasopressors.  - Blood pressure 128/69 stable   LOS: 7 Cordai Rodrigue 5/29/202512:41 PM

## 2023-08-13 ENCOUNTER — Encounter: Payer: Self-pay | Admitting: Vascular Surgery

## 2023-08-13 DIAGNOSIS — E1122 Type 2 diabetes mellitus with diabetic chronic kidney disease: Secondary | ICD-10-CM | POA: Diagnosis not present

## 2023-08-13 DIAGNOSIS — I482 Chronic atrial fibrillation, unspecified: Secondary | ICD-10-CM | POA: Diagnosis not present

## 2023-08-13 DIAGNOSIS — N186 End stage renal disease: Secondary | ICD-10-CM | POA: Diagnosis not present

## 2023-08-13 LAB — CBC
HCT: 27.3 % — ABNORMAL LOW (ref 39.0–52.0)
Hemoglobin: 9 g/dL — ABNORMAL LOW (ref 13.0–17.0)
MCH: 29 pg (ref 26.0–34.0)
MCHC: 33 g/dL (ref 30.0–36.0)
MCV: 88.1 fL (ref 80.0–100.0)
Platelets: 222 10*3/uL (ref 150–400)
RBC: 3.1 MIL/uL — ABNORMAL LOW (ref 4.22–5.81)
RDW: 16.9 % — ABNORMAL HIGH (ref 11.5–15.5)
WBC: 9.1 10*3/uL (ref 4.0–10.5)
nRBC: 5.1 % — ABNORMAL HIGH (ref 0.0–0.2)

## 2023-08-13 LAB — RENAL FUNCTION PANEL
Albumin: 2.7 g/dL — ABNORMAL LOW (ref 3.5–5.0)
Anion gap: 10 (ref 5–15)
BUN: 58 mg/dL — ABNORMAL HIGH (ref 8–23)
CO2: 27 mmol/L (ref 22–32)
Calcium: 8.5 mg/dL — ABNORMAL LOW (ref 8.9–10.3)
Chloride: 97 mmol/L — ABNORMAL LOW (ref 98–111)
Creatinine, Ser: 8.48 mg/dL — ABNORMAL HIGH (ref 0.61–1.24)
GFR, Estimated: 7 mL/min — ABNORMAL LOW (ref >60)
Glucose, Bld: 188 mg/dL — ABNORMAL HIGH (ref 70–99)
Phosphorus: 3.9 mg/dL (ref 2.5–4.6)
Potassium: 4.3 mmol/L (ref 3.5–5.1)
Sodium: 134 mmol/L — ABNORMAL LOW (ref 135–145)

## 2023-08-13 LAB — GLUCOSE, CAPILLARY
Glucose-Capillary: 142 mg/dL — ABNORMAL HIGH (ref 70–99)
Glucose-Capillary: 144 mg/dL — ABNORMAL HIGH (ref 70–99)
Glucose-Capillary: 150 mg/dL — ABNORMAL HIGH (ref 70–99)
Glucose-Capillary: 155 mg/dL — ABNORMAL HIGH (ref 70–99)
Glucose-Capillary: 164 mg/dL — ABNORMAL HIGH (ref 70–99)
Glucose-Capillary: 165 mg/dL — ABNORMAL HIGH (ref 70–99)
Glucose-Capillary: 167 mg/dL — ABNORMAL HIGH (ref 70–99)

## 2023-08-13 MED ORDER — HEPARIN SODIUM (PORCINE) 1000 UNIT/ML IJ SOLN
INTRAMUSCULAR | Status: AC
  Filled 2023-08-13: qty 10

## 2023-08-13 MED ORDER — PREDNISONE 10 MG PO TABS
5.0000 mg | ORAL_TABLET | Freq: Every day | ORAL | Status: DC
  Administered 2023-08-14 – 2023-08-15 (×2): 5 mg via ORAL
  Filled 2023-08-13 (×2): qty 1

## 2023-08-13 MED ORDER — HYDROCORTISONE SOD SUC (PF) 100 MG IJ SOLR
50.0000 mg | Freq: Two times a day (BID) | INTRAMUSCULAR | Status: AC
  Administered 2023-08-13: 50 mg via INTRAVENOUS
  Filled 2023-08-13: qty 1

## 2023-08-13 MED ORDER — EPOETIN ALFA-EPBX 10000 UNIT/ML IJ SOLN
INTRAMUSCULAR | Status: AC
  Filled 2023-08-13: qty 1

## 2023-08-13 NOTE — Plan of Care (Signed)
  Problem: Activity: Goal: Ability to tolerate increased activity will improve Outcome: Progressing   Problem: Respiratory: Goal: Ability to maintain a clear airway and adequate ventilation will improve Outcome: Progressing   Problem: Role Relationship: Goal: Method of communication will improve Outcome: Progressing   Problem: Education: Goal: Knowledge of General Education information will improve Description: Including pain rating scale, medication(s)/side effects and non-pharmacologic comfort measures Outcome: Progressing   Problem: Health Behavior/Discharge Planning: Goal: Ability to manage health-related needs will improve Outcome: Progressing   Problem: Clinical Measurements: Goal: Ability to maintain clinical measurements within normal limits will improve Outcome: Progressing Goal: Will remain free from infection Outcome: Progressing Goal: Diagnostic test results will improve Outcome: Progressing Goal: Respiratory complications will improve Outcome: Progressing Goal: Cardiovascular complication will be avoided Outcome: Progressing   Problem: Activity: Goal: Risk for activity intolerance will decrease Outcome: Progressing   Problem: Nutrition: Goal: Adequate nutrition will be maintained Outcome: Progressing   Problem: Coping: Goal: Level of anxiety will decrease Outcome: Progressing   Problem: Elimination: Goal: Will not experience complications related to bowel motility Outcome: Progressing   Problem: Pain Managment: Goal: General experience of comfort will improve and/or be controlled Outcome: Progressing   Problem: Safety: Goal: Ability to remain free from injury will improve Outcome: Progressing   Problem: Skin Integrity: Goal: Risk for impaired skin integrity will decrease Outcome: Progressing   Problem: Education: Goal: Ability to describe self-care measures that may prevent or decrease complications (Diabetes Survival Skills Education) will  improve Outcome: Progressing Goal: Individualized Educational Video(s) Outcome: Progressing   Problem: Coping: Goal: Ability to adjust to condition or change in health will improve Outcome: Progressing   Problem: Fluid Volume: Goal: Ability to maintain a balanced intake and output will improve Outcome: Progressing   Problem: Health Behavior/Discharge Planning: Goal: Ability to identify and utilize available resources and services will improve Outcome: Progressing Goal: Ability to manage health-related needs will improve Outcome: Progressing   Problem: Metabolic: Goal: Ability to maintain appropriate glucose levels will improve Outcome: Progressing   Problem: Nutritional: Goal: Maintenance of adequate nutrition will improve Outcome: Progressing Goal: Progress toward achieving an optimal weight will improve Outcome: Progressing   Problem: Skin Integrity: Goal: Risk for impaired skin integrity will decrease Outcome: Progressing   Problem: Tissue Perfusion: Goal: Adequacy of tissue perfusion will improve Outcome: Progressing

## 2023-08-13 NOTE — Progress Notes (Signed)
  Received patient in bed to unit.   Informed consent signed and in chart.    TX duration:3 hrs 20 mins     Transported by  Hand-off given to patient's nurse. Pt was d/c 10 mins early due to low bp in the 40's. Pt is stable now with bp 105/62.no pain noted.   Access used: temp cath Access issues: none   Total UF removed: 1.0 kgs Medication(s) given: 10,000 Retacrit  Post HD VS: wnl Post HD weight: 98.0 kg     N. Chaz Mcglasson LPN Kidney Dialysis Unit

## 2023-08-13 NOTE — TOC Progression Note (Addendum)
 Transition of Care [facility]) - Progression Note    Patient Details  Name: Adam Howell MRN: [id number] Date of Birth: 10-27-1960  Transition [facility] CM/SW Contact  Odilia Bennett, LCSW Phone Number: 08/13/2023, 10:10 AM  Clinical Narrative:   Sherian Dimitri calling daughter but phone says the number is not accepting calls.  11:35 am: [facility] is able to offer patient a LTC bed. Patient has accepted offer and would like rehab prior to transitioning to LTC. CSW started insurance authorization. Patient gets HD at [facility] MWF and has to be there by 6:50 for 7:00 chair time. SNF admissions coordinator is aware. CSW still unable to reach daughter. CSW sent her a text message requesting follow up when able.  12:10 pm: Received call back from daughter. She is very pleased with the bed offer from [facility]. Updated her on authorization process. Her phone is not working correctly right now. She said to call her sister if there are any needs: Twanda Gala [phone].  2:10 pm: Auth still pending.  4:10 pm: Auth still pending. Will plan for discharge on Monday.  Expected Discharge Plan: Skilled Nursing Facility    Expected Discharge Plan and Services       Living arrangements for the past 2 months: Skilled Nursing Facility Expected Discharge Date: 08/05/23                                     Social Determinants of Health (SDOH) Interventions SDOH Screenings   Food Insecurity: Patient Unable To Answer (08/07/2023)  Housing: Patient Unable To Answer (08/07/2023)  Transportation Needs: Patient Unable To Answer (08/07/2023)  Utilities: Patient Unable To Answer (08/07/2023)  Alcohol Screen: Low Risk  (11/25/2021)  Depression (PHQ2-9): Low Risk  (04/14/2023)  Financial Resource Strain: Low Risk  (11/25/2021)  Physical Activity: Insufficiently Active (11/25/2021)  Social Connections: Moderately Isolated (12/29/2022)  Stress: No Stress Concern Present (11/25/2021)   Tobacco Use: Low Risk  (08/05/2023)    Readmission Risk Interventions    02/16/2023    6:21 PM  Readmission Risk Prevention Plan  Transportation Screening Complete  Palliative Care Screening Not Applicable  Medication Review (RN Care Manager) Complete

## 2023-08-13 NOTE — Progress Notes (Signed)
 Progress Note   Patient: Adam Howell ZOX:[id number] DOB: 19-May-1960 DOA: 08/05/2023     8 DOS: the patient was seen and examined on 08/13/2023   Brief hospital course: Adam Howell is a 63 y.o. male with past medical history significant for ESRD on HD, HFrEF, hypertension, hyperlipidemia, who presented to the hospital for elective AV fistula revision.   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  The patient became hypotensive with SBP in the 60's concerning for bleeding in the diseased central venous system verses possible IV contrast reaction.  Fluroscopy did show some blood in the pleural cavity concerning for hemothorax.     Femoral temporary Dialysis catheter was placed along with femoral central line placement. Consideration was given for placing a chest tube, but it was felt this may result in increased bleeding as the pressure from the existing hematoma would help tamponade any further venous bleeding. Also of note, the patient had been on Eliquis  up till the day prior to this event and had received heparin  during the revision of his fistula.      Pt remained intubated post procedure requiring vasopressors, and was transferred to ICU.  A chest tube was placed on 5/23 for increased hemothorax, which was removed on 5/25.  Extubated on 5/26.    Principal Problem:   ESRD (end stage renal disease) (HCC) Active Problems:   Type 2 diabetes mellitus with ESRD (end-stage renal disease) (HCC)   Atrial fibrillation, chronic (HCC)   Anemia in chronic kidney disease   Obesity (BMI 30-39.9)   Peripheral vascular disease (HCC)   Chronic pain syndrome   Heart failure with reduced ejection fraction (HCC)   Hypertension   S/P AKA (above knee amputation), right (HCC)   Pressure injury of skin   Shock circulatory (HCC)   Assessment and Plan: Anaphylactic reaction versus  hemorrhagic  shock. Right hemothorax. Acute blood loss anemia. This happened while placing a dialysis catheter.  Condition has improved.   Acute hypoxemic respiratory failure. Condition also improved.   End-stage renal disease on hemodialysis  Hyponatremia. Patient had arterial graft angioplasty and stent placement 5/29. On scheduled hemodialysis.   Chronic atrial fibrillation. Eliquis  restarted.    Chronic diastolic congestive heart failure Severe LV hypertrophy. Currently, patient does not have volume overload.   Type 2 diabetes. Continue sliding scale insulin .        Subjective:  Patient doing well today, no complaint.  Physical Exam: Vitals:   08/13/23 1030 08/13/23 1100 08/13/23 1130 08/13/23 1146  BP: 115/60 (!) 88/49 130/79 104/65  Pulse: 76 76 76 73  Resp: 17 19 17  (!) 22  Temp:      TempSrc:      SpO2: 100% 100% 97%   Weight:      Height:       General exam: Appears calm and comfortable  Respiratory system: Clear to auscultation. Respiratory effort normal. Cardiovascular system: Appear regular.  No JVD, murmurs, rubs, gallops or clicks. No pedal edema. Gastrointestinal system: Abdomen is nondistended, soft and nontender. No organomegaly or masses felt. Normal bowel sounds heard. Central nervous system: Alert and oriented. No focal neurological deficits. Extremities: Status post right AKA. Skin: No rashes, lesions or ulcers Psychiatry: Judgement and insight appear normal. Mood & affect appropriate.    Data Reviewed:  Lab results reviewed.  Family Communication: None  Disposition: Status is: Inpatient Remains inpatient appropriate because: Discharge per  vascular surgery.     Time spent: 35 minutes  Author: Donaciano Frizzle, MD 08/13/2023 12:08 PM  For on call review [email or web address].

## 2023-08-13 NOTE — Progress Notes (Addendum)
 [facility]  ROUNDING NOTE   Subjective:   Adam Howell is a 63 y.o male with past medical history of diabetes, hypertension, CAD, Rt AKA, anemia, CHF, and ESRD on dialysis. Patient presents to vascular surgery for revision of AVF. He is being admitted for End stage renal disease (HCC) [N18.6] ESRD (end stage renal disease) (HCC) [N18.6]  Patient had anaphylactic shock prior to his procedure.   Patient had a right femoral temp HD catheter placed and receiving dialysis.   Update:   Patient seen and evaluated during dialysis   HEMODIALYSIS FLOWSHEET:  Blood Flow Rate (mL/min): 399 mL/min Arterial Pressure (mmHg): -228.07 mmHg Venous Pressure (mmHg): 178.78 mmHg TMP (mmHg): 1.21 mmHg Ultrafiltration Rate (mL/min): 0 mL/min Dialysate Flow Rate (mL/min): 299 ml/min Dialysis Fluid Bolus: Normal Saline Bolus Amount (mL): 100 mL  Having intermittent hypotension, UF adjusted   Objective:  Vital signs in last 24 hours:  Temp:  [97.2 F (36.2 C)-98.1 F (36.7 C)] 97.7 F (36.5 C) (05/30 0836) Pulse Rate:  [61-78] 76 (05/30 1130) Resp:  [7-23] 17 (05/30 1130) BP: (88-149)/(49-85) 130/79 (05/30 1130) SpO2:  [95 %-100 %] 97 % (05/30 1130) Weight:  [97.3 kg-99 kg] 99 kg (05/30 0835)  Weight change: -2.7 kg Filed Weights   08/11/23 1154 08/12/23 1425 08/13/23 0835  Weight: 97.3 kg 97.3 kg 99 kg    Intake/Output: I/O last 3 completed shifts: In: -  Out: 400 [Blood:400]   Intake/Output this shift:  No intake/output data recorded.  Physical Exam: General: No acute distress  Head: [place]/AT, hearing intact  Eyes: Anicteric  Lungs:  Clear bilateral  Heart: Regular rate and rhythm  Abdomen:  Soft, nontender, obese  Extremities: No peripheral edema  Right AKA  Neurologic: Alert and oriented  Skin: No lesions  Access: Left aVF (no bruit or thrill) right HD femoral permcath    Basic Metabolic Panel: Recent Labs  Lab 08/08/23 0307 08/09/23 0208  08/10/23 0354 08/11/23 0350 08/12/23 0450 08/13/23 0523  NA 133* 132* 135 133* 136 134*  K 3.8 4.0 3.7 3.9 4.0 4.3  CL 93* 92* 95* 93* 98 97*  CO2 27 26 28 27 27 27   GLUCOSE 135* 149* 144* 204* 143* 188*  BUN 26* 48* 35* 53* 36* 58*  CREATININE 5.26* 7.38* 5.23* 7.63* 6.13* 8.48*  CALCIUM  8.4* 8.1* 8.3* 8.1* 8.5* 8.5*  MG 1.9 2.4  --   --   --   --   PHOS 4.4 4.3 2.8 2.7 2.7 3.9    Liver Function Tests: Recent Labs  Lab 08/08/23 0307 08/10/23 0354 08/11/23 0350 08/12/23 0450 08/13/23 0523  ALBUMIN  2.9* 2.8* 2.6* 2.6* 2.7*   No results for input(s): "LIPASE", "AMYLASE" in the last 168 hours. No results for input(s): "AMMONIA" in the last 168 hours.  CBC: Recent Labs  Lab 08/09/23 0850 08/10/23 0354 08/11/23 0350 08/12/23 0450 08/13/23 0523  WBC 12.9* 10.5 12.0* 11.8* 9.1  HGB 8.0* 8.9* 8.6* 8.4* 9.0*  HCT 24.5* 27.5* 25.8* 25.8* 27.3*  MCV 85.7 87.6 86.3 86.6 88.1  PLT 124* 125* 157 176 222    Cardiac Enzymes: No results for input(s): "CKTOTAL", "CKMB", "CKMBINDEX", "TROPONINI" in the last 168 hours.  BNP: Invalid input(s): "POCBNP"  CBG: Recent Labs  Lab 08/12/23 1618 08/12/23 2010 08/13/23 0016 08/13/23 0403 08/13/23 0803  GLUCAP 126* 138* 155* 167* 165*    Microbiology: Results for orders placed or performed during the hospital encounter of 08/05/23  Culture, Respiratory w Gram Stain  Status: None   Collection Time: 08/07/23  8:09 AM   Specimen: Tracheal Aspirate; Respiratory  Result Value Ref Range Status   Specimen Description   Final    TRACHEAL ASPIRATE Performed at [facility], [address], [place], [place] [address]    Special Requests   Final    NONE Performed at [facility], [address] [place], [place] [address]    Gram Stain   Final    FEW WBC PRESENT, PREDOMINANTLY MONONUCLEAR FEW GRAM POSITIVE COCCI IN CLUSTERS Performed at [facility], [address] [place], [place] [address]     Culture FEW METHICILLIN RESISTANT STAPHYLOCOCCUS AUREUS  Final   Report Status 08/10/2023 FINAL  Final   Organism ID, Bacteria METHICILLIN RESISTANT STAPHYLOCOCCUS AUREUS  Final      Susceptibility   Methicillin resistant staphylococcus aureus - MIC*    CIPROFLOXACIN >=8 RESISTANT Resistant     ERYTHROMYCIN >=8 RESISTANT Resistant     GENTAMICIN <=0.5 SENSITIVE Sensitive     OXACILLIN >=4 RESISTANT Resistant     TETRACYCLINE >=16 RESISTANT Resistant     VANCOMYCIN  1 SENSITIVE Sensitive     TRIMETH /SULFA  <=10 SENSITIVE Sensitive     CLINDAMYCIN >=8 RESISTANT Resistant     RIFAMPIN <=0.5 SENSITIVE Sensitive     Inducible Clindamycin NEGATIVE Sensitive     LINEZOLID  2 SENSITIVE Sensitive     * FEW METHICILLIN RESISTANT STAPHYLOCOCCUS AUREUS    Coagulation Studies: No results for input(s): "LABPROT", "INR" in the last 72 hours.   Urinalysis: No results for input(s): "COLORURINE", "LABSPEC", "PHURINE", "GLUCOSEU", "HGBUR", "BILIRUBINUR", "KETONESUR", "PROTEINUR", "UROBILINOGEN", "NITRITE", "LEUKOCYTESUR" in the last 72 hours.  Invalid input(s): "APPERANCEUR"    Imaging: PERIPHERAL VASCULAR CATHETERIZATION Result Date: 08/12/2023 See surgical note for result.  DG Chest Port 1 View Result Date: 08/11/2023 CLINICAL DATA:  Hemodialysis fatigue EXAM: PORTABLE CHEST 1 VIEW COMPARISON:  08/10/2023 FINDINGS: Cardiomegaly. No acute airspace disease, pleural effusion or pneumothorax. Minimal atelectasis left base. IMPRESSION: No active disease. Cardiomegaly. Electronically Signed   By: Esmeralda Hedge M.D.   On: 08/11/2023 18:59     Medications:      amiodarone   200 mg Oral Daily   apixaban   2.5 mg Oral BID   vitamin C  500 mg Oral BID   Chlorhexidine  Gluconate Cloth  6 each Topical Q0600   epoetin  alfa-epbx (RETACRIT ) injection  10,000 Units Intravenous Q M,W,F-1800   feeding supplement (NEPRO CARB STEADY)  237 mL Oral TID BM   hydrocerin   Topical Daily   hydrocortisone sod  succinate (SOLU-CORTEF) inj  50 mg Intravenous Q12H   insulin  aspart  0-9 Units Subcutaneous Q4H   iron  polysaccharides  150 mg Oral Daily   ketotifen  1 drop Both Eyes BID   lidocaine   5 mL Intradermal Once   midodrine  10 mg Oral TID   multivitamin  1 tablet Oral QHS   polyethylene glycol  17 g Oral Daily   rosuvastatin   40 mg Oral Daily   senna-docusate  2 tablet Oral BID   zinc sulfate (50mg  elemental zinc)  220 mg Oral Daily   alum & mag hydroxide-simeth, guaiFENesin -dextromethorphan, melatonin, ondansetron , mouth rinse, oxyCODONE -acetaminophen , phenol, potassium chloride  Assessment/ Plan:  Adam Howell is a 63 y.o.  male with end stage renal disease on hemodialysis, diabetes, hypertension, CAD, Rt AKA, anemia, CHF who was admitted to [facility] on 08/05/2023 for revision of AVF. Patient had anaphylactic reaction and admitted to ICU.  [facility]/MWF/left AVF  End-stage renal disease with hyperkalemia on hemodialysis.  With complication of hemodialysis access.  - appreciate vascular input. Will need to assess to see if his AVF can be salvaged. If not, patient will need a tunneled catheter. - Receiving dialysis today, UF goal reduced to 1.5L due to hypotension.  - Next treatment scheduled for Monday - Vascular surgery confirms Rt femoral permcath. Fistulagram performed with stent placement. Vascular request 3-4 weeks to rest.    2. Anemia of chronic kidney disease Lab Results  Component Value Date   HGB 9.0 (L) 08/13/2023   Continue Epogen  10,000 units IV with dialysis treatments.  3. Secondary Hyperparathyroidism: with outpatient labs: PTH 410, phosphorus 4.7, calcium  8.8 on 07/19/23.    Lab Results  Component Value Date   CALCIUM  8.5 (L) 08/13/2023   CAION 1.06 (L) 08/05/2023   PHOS 3.9 08/13/2023    Will monitor bone minerals.  4. Hypotension: with recent systemic shock.   - Now off vasopressors.  - Blood pressure 130/79 during dialysis   LOS:  8 America Sandall 5/30/202511:46 AM

## 2023-08-13 NOTE — Progress Notes (Signed)
 Physical Therapy Treatment Patient Details Name: Adam Howell MRN: [id number] DOB: October 18, 1960 Today's Date: 08/13/2023   History of Present Illness Pt is a 63 y/o M admitted on 08/05/23 after presenting for elective AV fistula revision. PermCath placement was complicated by innominate vein perforation with large R hemothorax & concern for potential anaphylactic reaction to IV contrast & shock. PMH: ESRD on HD, HFrEF, HTN, HLD, AKA    PT Comments  Pt fatigued after HD this date and declined mobility training despite encouragement but did agree to below therex.  Pt put forth good effort during the session and tolerated manual resistance when appropriate with no adverse symptoms.  Pt will benefit from continued PT services upon discharge to safely address deficits listed in patient problem list for decreased caregiver assistance and eventual return to PLOF.     If plan is discharge home, recommend the following: Assistance with feeding;Direct supervision/assist for medications management;Assistance with cooking/housework;Direct supervision/assist for financial management;A little help with walking and/or transfers;A little help with bathing/dressing/bathroom;Assist for transportation   Can travel by private vehicle     No  Equipment Recommendations  Other (comment) (TBD at next venue of care)    Recommendations for Other Services       Precautions / Restrictions Precautions Precautions: Fall Recall of Precautions/Restrictions: Intact Precaution/Restrictions Comments: femoral Perm HD cath per Dr. Vonna Guardian on 5/30, no temp cath and no restrictions with mobility Restrictions Weight Bearing Restrictions Per Provider Order: No     Mobility  Bed Mobility               General bed mobility comments: Pt declined mobility this session secondary to fatigue from HD but agreed to below therex    Transfers                        Ambulation/Gait                    Stairs             Wheelchair Mobility     Tilt Bed    Modified Rankin (Stroke Patients Only)       Balance                                            Communication Communication Communication: No apparent difficulties  Cognition Arousal: Alert Behavior During Therapy: WFL for tasks assessed/performed   PT - Cognitive impairments: No apparent impairments                         Following commands: Intact Following commands impaired: Follows one step commands with increased time    Cueing Cueing Techniques: Verbal cues, Tactile cues  Exercises Total Joint Exercises Ankle Circles/Pumps: Strengthening, Left, 5 reps, 10 reps (with manual resistance) Quad Sets: Strengthening, Left, 5 reps, 10 reps Gluteal Sets: Strengthening, Both, 10 reps Heel Slides: AROM, Strengthening, Left, 10 reps Hip ABduction/ADduction: Strengthening, Right, 5 reps, 10 reps (with manual resistance) Straight Leg Raises: Strengthening, Right, 5 reps, 10 reps (with manual resistance) Standing Hip Extension: Strengthening, Sidelying, Right, 5 reps, 10 reps, AROM Bridges: AROM, Strengthening, Both, 10 reps (with RLE on bolster to assist) Other Exercises Other Exercises: HEP education: LLE APs and QS and BLE GS x 10 every 1-2 hours, L-sidelying R hip ext AROM x 10 reps  2-3x/day    General Comments        Pertinent Vitals/Pain Pain Assessment Pain Assessment: No/denies pain    Home Living                          Prior Function            PT Goals (current goals can now be found in the care plan section) Progress towards PT goals: Progressing toward goals    Frequency    Min 1X/week      PT Plan      Co-evaluation              AM-PAC PT "6 Clicks" Mobility   Outcome Measure  Help needed turning from your back to your side while in a flat bed without using bedrails?: A Little Help needed moving from lying on your back to  sitting on the side of a flat bed without using bedrails?: A Lot Help needed moving to and from a bed to a chair (including a wheelchair)?: A Little Help needed standing up from a chair using your arms (e.g., wheelchair or bedside chair)?: A Little Help needed to walk in hospital room?: Total Help needed climbing 3-5 steps with a railing? : Total 6 Click Score: 13    End of Session   Activity Tolerance: Patient tolerated treatment well Patient left: in bed;with call bell/phone within reach;with bed alarm set Nurse Communication: Mobility status PT Visit Diagnosis: Muscle weakness (generalized) (M62.81);Other abnormalities of gait and mobility (R26.89)     Time: 3474-2595 PT Time Calculation (min) (ACUTE ONLY): 17 min  Charges:    $Therapeutic Exercise: 8-22 mins PT General Charges $$ ACUTE PT VISIT: 1 Visit                    D. Madalyn Scarce PT, DPT 08/13/23, 4:48 PM

## 2023-08-13 NOTE — Progress Notes (Signed)
 Progress Note    08/13/2023 7:14 AM 1 Day Post-Op  Subjective:  Adam Howell is a 63 yo male POD #1 from  PROCEDURE: 1.  Ultrasound guidance for vascular access to the left radial artery distal to the fistula 2.  Left arm fistulagram  3.   Mechanical thrombectomy to the left radiocephalic AV fistula and Artegraft jump graft all the way up to the basilic vein in the upper arm with the Penumbra Cat 7D catheter 4.  Percutaneous transluminal angioplasty of both anastomosis of the Artegraft jump graft to the forearm cephalic vein as well as the basilic vein in the upper arm with 7 mm diameter angioplasty balloon 5.  Stent placement to the more proximal anastomosis of the Artegraft jump graft near the antecubital fossa with 8 mm diameter by 10 cm length Viabahn stent 6.  Stent placement to the distal anastomosis of the Artegraft jump graft near the wrist with 8 mm diameter by 2.5 cm length Viabahn stent  Vitals:   08/12/23 2046 08/13/23 0354  BP: 105/69 135/82  Pulse: 78 72  Resp: 20 20  Temp: (!) 97.3 F (36.3 C) 98 F (36.7 C)  SpO2: 99% 98%   Physical Exam: Cardiac:  RRR. Normal S1,S2.  No rubs clicks gallops or murmurs. Lungs: Lungs clear on auscultation throughout, normal labored breathing.  No rales rhonchi or wheezing noted. Incisions: Left forearm with AV graft.  Clean dry and intact.  No hematoma seroma noted. Extremities: Palpable pulses throughout.  Left upper extremity AV graft.  Sutures in place. Abdomen: Positive bowel sounds throughout, soft, nontender nondistended. Neurologic: Alert and oriented x 3, answers all questions and follows commands appropriately.  CBC    Component Value Date/Time   WBC 9.1 08/13/2023 0523   RBC 3.10 (L) 08/13/2023 0523   HGB 9.0 (L) 08/13/2023 0523   HGB 16.8 01/12/2023 1414   HCT 27.3 (L) 08/13/2023 0523   HCT 51.0 01/12/2023 1414   PLT 222 08/13/2023 0523   PLT 257 01/12/2023 1414   MCV 88.1 08/13/2023 0523   MCV 88  01/12/2023 1414   MCH 29.0 08/13/2023 0523   MCHC 33.0 08/13/2023 0523   RDW 16.9 (H) 08/13/2023 0523   RDW 14.8 01/12/2023 1414   LYMPHSABS 1.6 08/05/2023 1305   LYMPHSABS 1.0 01/12/2023 1414   MONOABS 0.4 08/05/2023 1305   EOSABS 0.1 08/05/2023 1305   EOSABS 0.2 01/12/2023 1414   BASOSABS 0.1 08/05/2023 1305   BASOSABS 0.1 01/12/2023 1414    BMET    Component Value Date/Time   NA 134 (L) 08/13/2023 0523   NA 136 04/14/2023 1610   K 4.3 08/13/2023 0523   CL 97 (L) 08/13/2023 0523   CO2 27 08/13/2023 0523   GLUCOSE 188 (H) 08/13/2023 0523   BUN 58 (H) 08/13/2023 0523   BUN 18 04/14/2023 1610   CREATININE 8.48 (H) 08/13/2023 0523   CALCIUM  8.5 (L) 08/13/2023 0523   GFRNONAA 7 (L) 08/13/2023 0523    INR    Component Value Date/Time   INR 1.3 (H) 08/05/2023 1305     Intake/Output Summary (Last 24 hours) at 08/13/2023 0714 Last data filed at 08/12/2023 1616 Gross per 24 hour  Intake --  Output 400 ml  Net -400 ml     Assessment/Plan:  63 y.o. male is s/p see above 1 Day Post-Op   PLAN Patient has right groin permacatheter.  He will continue to use this for hemodialysis. Okay for patient to discharge back to SNF.  Case management made aware working on this.  DVT prophylaxis: Heparin  with dialysis and Eliquis  2.5 mg twice a day   Annamaria Barrette [facility] 08/13/2023 7:14 AM

## 2023-08-13 NOTE — Plan of Care (Signed)
  Problem: Activity: Goal: Ability to tolerate increased activity will improve 08/13/2023 0445 by Dallis Dues, RN Outcome: Progressing 08/13/2023 0444 by Dallis Dues, RN Outcome: Progressing

## 2023-08-14 DIAGNOSIS — I482 Chronic atrial fibrillation, unspecified: Secondary | ICD-10-CM | POA: Diagnosis not present

## 2023-08-14 DIAGNOSIS — I120 Hypertensive chronic kidney disease with stage 5 chronic kidney disease or end stage renal disease: Secondary | ICD-10-CM | POA: Diagnosis not present

## 2023-08-14 DIAGNOSIS — Z992 Dependence on renal dialysis: Secondary | ICD-10-CM | POA: Diagnosis not present

## 2023-08-14 DIAGNOSIS — N186 End stage renal disease: Secondary | ICD-10-CM | POA: Diagnosis not present

## 2023-08-14 DIAGNOSIS — E1122 Type 2 diabetes mellitus with diabetic chronic kidney disease: Secondary | ICD-10-CM | POA: Diagnosis not present

## 2023-08-14 LAB — RENAL FUNCTION PANEL
Albumin: 2.6 g/dL — ABNORMAL LOW (ref 3.5–5.0)
Anion gap: 9 (ref 5–15)
BUN: 43 mg/dL — ABNORMAL HIGH (ref 8–23)
CO2: 28 mmol/L (ref 22–32)
Calcium: 8.2 mg/dL — ABNORMAL LOW (ref 8.9–10.3)
Chloride: 96 mmol/L — ABNORMAL LOW (ref 98–111)
Creatinine, Ser: 6.64 mg/dL — ABNORMAL HIGH (ref 0.61–1.24)
GFR, Estimated: 9 mL/min — ABNORMAL LOW (ref >60)
Glucose, Bld: 142 mg/dL — ABNORMAL HIGH (ref 70–99)
Phosphorus: 2.5 mg/dL (ref 2.5–4.6)
Potassium: 4.2 mmol/L (ref 3.5–5.1)
Sodium: 133 mmol/L — ABNORMAL LOW (ref 135–145)

## 2023-08-14 LAB — CBC
HCT: 25.6 % — ABNORMAL LOW (ref 39.0–52.0)
Hemoglobin: 8.4 g/dL — ABNORMAL LOW (ref 13.0–17.0)
MCH: 29.2 pg (ref 26.0–34.0)
MCHC: 32.8 g/dL (ref 30.0–36.0)
MCV: 88.9 fL (ref 80.0–100.0)
Platelets: 187 10*3/uL (ref 150–400)
RBC: 2.88 MIL/uL — ABNORMAL LOW (ref 4.22–5.81)
RDW: 18 % — ABNORMAL HIGH (ref 11.5–15.5)
WBC: 8.6 10*3/uL (ref 4.0–10.5)
nRBC: 5.7 % — ABNORMAL HIGH (ref 0.0–0.2)

## 2023-08-14 LAB — GLUCOSE, CAPILLARY
Glucose-Capillary: 159 mg/dL — ABNORMAL HIGH (ref 70–99)
Glucose-Capillary: 115 mg/dL — ABNORMAL HIGH (ref 70–99)
Glucose-Capillary: 197 mg/dL — ABNORMAL HIGH (ref 70–99)
Glucose-Capillary: 172 mg/dL — ABNORMAL HIGH (ref 70–99)
Glucose-Capillary: 96 mg/dL (ref 70–99)
Glucose-Capillary: 171 mg/dL — ABNORMAL HIGH (ref 70–99)

## 2023-08-14 NOTE — Progress Notes (Signed)
 [facility]  ROUNDING NOTE   Subjective:   Adam Howell is a 63 y.o male with past medical history of diabetes, hypertension, CAD, Rt AKA, anemia, CHF, and ESRD on dialysis. Patient presents to vascular surgery for revision of AVF. He is being admitted for End stage renal disease (HCC) [N18.6] ESRD (end stage renal disease) (HCC) [N18.6]  Patient had anaphylactic shock prior to his procedure.   Patient had a right femoral temp HD catheter placed and receiving dialysis.   Update:    Patient seen laying in bed Alert No complaints Awaiting discharge to rehab early this week.    Objective:  Vital signs in last 24 hours:  Temp:  [97.8 F (36.6 C)-98.5 F (36.9 C)] 98.5 F (36.9 C) (05/31 0746) Pulse Rate:  [61-68] 61 (05/31 0746) Resp:  [16-19] 19 (05/31 0746) BP: (85-116)/(37-56) 106/54 (05/31 0746) SpO2:  [96 %-100 %] 96 % (05/31 0746)  Weight change: 1.7 kg Filed Weights   08/12/23 1425 08/13/23 0835 08/13/23 1235  Weight: 97.3 kg 99 kg 98 kg    Intake/Output: I/O last 3 completed shifts: In: 240 [P.O.:240] Out: 2000 [Other:2000]   Intake/Output this shift:  No intake/output data recorded.  Physical Exam: General: No acute distress  Head: [place]/AT, hearing intact  Eyes: Anicteric  Lungs:  Clear bilateral  Heart: Regular rate and rhythm  Abdomen:  Soft, nontender, obese  Extremities: No peripheral edema  Right AKA  Neurologic: Alert and oriented  Skin: No lesions  Access: Left aVF (resting) right HD femoral permcath    Basic Metabolic Panel: Recent Labs  Lab 08/08/23 0307 08/09/23 0208 08/10/23 0354 08/11/23 0350 08/12/23 0450 08/13/23 0523 08/14/23 0709  NA 133* 132* 135 133* 136 134* 133*  K 3.8 4.0 3.7 3.9 4.0 4.3 4.2  CL 93* 92* 95* 93* 98 97* 96*  CO2 27 26 28 27 27 27 28   GLUCOSE 135* 149* 144* 204* 143* 188* 142*  BUN 26* 48* 35* 53* 36* 58* 43*  CREATININE 5.26* 7.38* 5.23* 7.63* 6.13* 8.48* 6.64*  CALCIUM  8.4* 8.1*  8.3* 8.1* 8.5* 8.5* 8.2*  MG 1.9 2.4  --   --   --   --   --   PHOS 4.4 4.3 2.8 2.7 2.7 3.9 2.5    Liver Function Tests: Recent Labs  Lab 08/10/23 0354 08/11/23 0350 08/12/23 0450 08/13/23 0523 08/14/23 0709  ALBUMIN  2.8* 2.6* 2.6* 2.7* 2.6*   No results for input(s): "LIPASE", "AMYLASE" in the last 168 hours. No results for input(s): "AMMONIA" in the last 168 hours.  CBC: Recent Labs  Lab 08/10/23 0354 08/11/23 0350 08/12/23 0450 08/13/23 0523 08/14/23 0709  WBC 10.5 12.0* 11.8* 9.1 8.6  HGB 8.9* 8.6* 8.4* 9.0* 8.4*  HCT 27.5* 25.8* 25.8* 27.3* 25.6*  MCV 87.6 86.3 86.6 88.1 88.9  PLT 125* 157 176 222 187    Cardiac Enzymes: No results for input(s): "CKTOTAL", "CKMB", "CKMBINDEX", "TROPONINI" in the last 168 hours.  BNP: Invalid input(s): "POCBNP"  CBG: Recent Labs  Lab 08/13/23 2042 08/13/23 2340 08/14/23 0430 08/14/23 0749 08/14/23 1248  GLUCAP 164* 142* 159* 115* 172*    Microbiology: Results for orders placed or performed during the hospital encounter of 08/05/23  Culture, Respiratory w Gram Stain     Status: None   Collection Time: 08/07/23  8:09 AM   Specimen: Tracheal Aspirate; Respiratory  Result Value Ref Range Status   Specimen Description   Final    TRACHEAL ASPIRATE Performed at  [facility], [address], [place], [place] [address]    Special Requests   Final    NONE Performed at [facility], [address] [place], [place] [address]    Gram Stain   Final    FEW WBC PRESENT, PREDOMINANTLY MONONUCLEAR FEW GRAM POSITIVE COCCI IN CLUSTERS Performed at [facility], [address], [place], [place] [address]    Culture FEW METHICILLIN RESISTANT STAPHYLOCOCCUS AUREUS  Final   Report Status 08/10/2023 FINAL  Final   Organism ID, Bacteria METHICILLIN RESISTANT STAPHYLOCOCCUS AUREUS  Final      Susceptibility   Methicillin resistant staphylococcus aureus - MIC*    CIPROFLOXACIN >=8 RESISTANT Resistant      ERYTHROMYCIN >=8 RESISTANT Resistant     GENTAMICIN <=0.5 SENSITIVE Sensitive     OXACILLIN >=4 RESISTANT Resistant     TETRACYCLINE >=16 RESISTANT Resistant     VANCOMYCIN  1 SENSITIVE Sensitive     TRIMETH /SULFA  <=10 SENSITIVE Sensitive     CLINDAMYCIN >=8 RESISTANT Resistant     RIFAMPIN <=0.5 SENSITIVE Sensitive     Inducible Clindamycin NEGATIVE Sensitive     LINEZOLID  2 SENSITIVE Sensitive     * FEW METHICILLIN RESISTANT STAPHYLOCOCCUS AUREUS    Coagulation Studies: No results for input(s): "LABPROT", "INR" in the last 72 hours.   Urinalysis: No results for input(s): "COLORURINE", "LABSPEC", "PHURINE", "GLUCOSEU", "HGBUR", "BILIRUBINUR", "KETONESUR", "PROTEINUR", "UROBILINOGEN", "NITRITE", "LEUKOCYTESUR" in the last 72 hours.  Invalid input(s): "APPERANCEUR"    Imaging: PERIPHERAL VASCULAR CATHETERIZATION Result Date: 08/12/2023 See surgical note for result.    Medications:      amiodarone   200 mg Oral Daily   apixaban   2.5 mg Oral BID   vitamin C   500 mg Oral BID   Chlorhexidine  Gluconate Cloth  6 each Topical Q0600   epoetin  alfa-epbx (RETACRIT ) injection  10,000 Units Intravenous Q M,W,F-1800   feeding supplement (NEPRO CARB STEADY)  237 mL Oral TID BM   hydrocerin   Topical Daily   insulin  aspart  0-9 Units Subcutaneous Q4H   iron  polysaccharides  150 mg Oral Daily   ketotifen   1 drop Both Eyes BID   lidocaine   5 mL Intradermal Once   midodrine   10 mg Oral TID   multivitamin  1 tablet Oral QHS   polyethylene glycol  17 g Oral Daily   predniSONE   5 mg Oral Q breakfast   rosuvastatin   40 mg Oral Daily   senna-docusate  2 tablet Oral BID   zinc  sulfate (50mg  elemental zinc )  220 mg Oral Daily   alum & mag hydroxide-simeth, guaiFENesin -dextromethorphan, melatonin, ondansetron , mouth rinse, oxyCODONE -acetaminophen , phenol, potassium chloride   Assessment/ Plan:  [name] is a 63 y.o.  male with end stage renal disease on hemodialysis, diabetes,  hypertension, CAD, Rt AKA, anemia, CHF who was admitted to [facility] on 08/05/2023 for revision of AVF. Patient had anaphylactic reaction and admitted to ICU.   [facility]/MWF/left AVF  End-stage renal disease with hyperkalemia on hemodialysis.  With complication of hemodialysis access.  - appreciate vascular input. Will need to assess to see if his AVF can be salvaged. If not, patient will need a tunneled catheter. - Received dialysis yesterday, UF2L achieved. - Next treatment scheduled for Monday - Vascular surgery confirms Rt femoral permcath. Fistulagram performed with stent placement. Vascular request 3-4 weeks to rest.    2. Anemia of chronic kidney disease Lab Results  Component Value Date   HGB 8.4 (L) 08/14/2023  Continue Epogen  10,000 units IV with dialysis treatments.  3. Secondary Hyperparathyroidism: with outpatient labs: PTH 410, phosphorus 4.7, calcium  8.8 on 07/19/23.    Lab Results  Component Value Date   CALCIUM  8.2 (L) 08/14/2023   CAION 1.06 (L) 08/05/2023   PHOS 2.5 08/14/2023    Bone minerals acceptable.   4. Hypotension: with recent systemic shock.   - Now off vasopressors.  - Blood pressure 116/56   LOS: 9 Arieanna Pressey 5/31/20251:44 PM

## 2023-08-14 NOTE — Progress Notes (Signed)
 Progress Note   Patient: Adam Howell ZOX:[id number] DOB: [birth year] DOA: 08/05/2023     9 DOS: the patient was seen and examined on 08/14/2023   Brief hospital course: Adam Howell is a 63 y.o. male with past medical history significant for ESRD on HD, HFrEF, hypertension, hyperlipidemia, who presented to the hospital for elective AV fistula revision.   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  The patient became hypotensive with SBP in the 60's concerning for bleeding in the diseased central venous system verses possible IV contrast reaction.  Fluroscopy did show some blood in the pleural cavity concerning for hemothorax.     Femoral temporary Dialysis catheter was placed along with femoral central line placement. Consideration was given for placing a chest tube, but it was felt this may result in increased bleeding as the pressure from the existing hematoma would help tamponade any further venous bleeding. Also of note, the patient had been on Eliquis  up till the day prior to this event and had received heparin  during the revision of his fistula.      Pt remained intubated post procedure requiring vasopressors, and was transferred to ICU.  A chest tube was placed on 5/23 for increased hemothorax, which was removed on 5/25.  Extubated on 5/26.    Principal Problem:   ESRD (end stage renal disease) (HCC) Active Problems:   Type 2 diabetes mellitus with ESRD (end-stage renal disease) (HCC)   Atrial fibrillation, chronic (HCC)   Anemia in chronic kidney disease   Obesity (BMI 30-39.9)   Peripheral vascular disease (HCC)   Chronic pain syndrome   Heart failure with reduced ejection fraction (HCC)   Hypertension   S/P AKA (above knee amputation), right (HCC)   Pressure injury of skin   Shock circulatory (HCC)   Assessment and Plan: Anaphylactic reaction versus  hemorrhagic  shock. Right hemothorax. Acute blood loss anemia. This happened while placing a dialysis catheter.  Condition has improved.   Acute hypoxemic respiratory failure. Condition also improved.   End-stage renal disease on hemodialysis  Hyponatremia. Patient had arterial graft angioplasty and stent placement 5/29. On scheduled hemodialysis.   Chronic atrial fibrillation. Eliquis  restarted.     Chronic diastolic congestive heart failure Severe LV hypertrophy. Currently, patient does not have volume overload.   Type 2 diabetes. Continue sliding scale insulin .  No change in treatment plan.  Pending nursing home placement.    Subjective: Patient doing well, no complaint.  No shortness of breath.  Physical Exam: Vitals:   08/13/23 2022 08/13/23 2049 08/14/23 0439 08/14/23 0746  BP: (!) 85/37 (!) 97/54 (!) 116/56 (!) 106/54  Pulse: 68 64 63 61  Resp: 16  16 19   Temp: 97.8 F (36.6 C)  98.2 F (36.8 C) 98.5 F (36.9 C)  TempSrc: Oral  Oral   SpO2: 100%  97% 96%  Weight:      Height:       General exam: Appears calm and comfortable  Respiratory system: Clear to auscultation. Respiratory effort normal. Cardiovascular system: Appear regular. No JVD, murmurs, rubs, gallops or clicks. No pedal edema. Gastrointestinal system: Abdomen is nondistended, soft and nontender. No organomegaly or masses felt. Normal bowel sounds heard. Central nervous system: Alert and oriented. No focal neurological deficits. Extremities: Status post right AKA. Skin: No rashes, lesions or ulcers Psychiatry: Judgement and insight appear normal. Mood & affect appropriate.  Data Reviewed:  Lab results reviewed.  Family Communication: NONE  Disposition: Status is: Inpatient Remains inpatient appropriate because: Severity of disease,     Time spent: 35 minutes  Author: Donaciano Frizzle, MD 08/14/2023 12:27 PM  For on call review [email or web address].

## 2023-08-14 NOTE — Progress Notes (Addendum)
      Daily Progress Note   Assessment/Planning:   POD #2 s/p Mech TE L RC AVF, PTA+S prox & distal Artegraft anast POD #9 s/p failed RIJ TDC, R fem TDC POD #9 s/p ligation of L RC AVF, L radiobasilic AVG with Artegraft  Pt wants to go home but PT notes suggests patient isn't ready PT/OT Home needs vs SNF placement: order entered   Subjective  - 2 Days Post-Op   Wants to go home   Objective   Vitals:   08/13/23 2022 08/13/23 2049 08/14/23 0439 08/14/23 0746  BP: (!) 85/37 (!) 97/54 (!) 116/56 (!) 106/54  Pulse: 68 64 63 61  Resp: 16  16 19   Temp: 97.8 F (36.6 C)  98.2 F (36.8 C) 98.5 F (36.9 C)  TempSrc: Oral  Oral   SpO2: 100%  97% 96%  Weight:      Height:         Intake/Output Summary (Last 24 hours) at 08/14/2023 0753 Last data filed at 08/13/2023 1921 Gross per 24 hour  Intake 240 ml  Output 2000 ml  Net -1760 ml    PULM  CTAB  CV  RRR  GI  soft, NTND  VASC L forearm bruit and thrill, inc healed, large eschar overlying aneurysmal RCAVF w/o thrill, R groin TDC site without any bleeding  NEURO A&O x 3    Laboratory   CBC    Latest Ref Rng & Units 08/14/2023    7:09 AM 08/13/2023    5:23 AM 08/12/2023    4:50 AM  CBC  WBC 4.0 - 10.5 K/uL 8.6  9.1  11.8   Hemoglobin 13.0 - 17.0 g/dL 8.4  9.0  8.4   Hematocrit 39.0 - 52.0 % 25.6  27.3  25.8   Platelets 150 - 400 K/uL 187  222  176     BMET    Component Value Date/Time   NA 133 (L) 08/14/2023 0709   NA 136 04/14/2023 1610   K 4.2 08/14/2023 0709   CL 96 (L) 08/14/2023 0709   CO2 28 08/14/2023 0709   GLUCOSE 142 (H) 08/14/2023 0709   BUN 43 (H) 08/14/2023 0709   BUN 18 04/14/2023 1610   CREATININE 6.64 (H) 08/14/2023 0709   CALCIUM  8.2 (L) 08/14/2023 0709   GFRNONAA 9 (L) 08/14/2023 0709     Roxy Cordial, MD, FACS, FSVS Covering for [facility]: [phone]  08/14/2023, 7:53 AM

## 2023-08-15 DIAGNOSIS — I482 Chronic atrial fibrillation, unspecified: Secondary | ICD-10-CM | POA: Diagnosis not present

## 2023-08-15 DIAGNOSIS — E1122 Type 2 diabetes mellitus with diabetic chronic kidney disease: Secondary | ICD-10-CM | POA: Diagnosis not present

## 2023-08-15 DIAGNOSIS — N186 End stage renal disease: Secondary | ICD-10-CM | POA: Diagnosis not present

## 2023-08-15 LAB — GLUCOSE, CAPILLARY
Glucose-Capillary: 140 mg/dL — ABNORMAL HIGH (ref 70–99)
Glucose-Capillary: 145 mg/dL — ABNORMAL HIGH (ref 70–99)
Glucose-Capillary: 141 mg/dL — ABNORMAL HIGH (ref 70–99)
Glucose-Capillary: 175 mg/dL — ABNORMAL HIGH (ref 70–99)
Glucose-Capillary: 152 mg/dL — ABNORMAL HIGH (ref 70–99)

## 2023-08-15 NOTE — Progress Notes (Signed)
 [facility]  ROUNDING NOTE   Subjective:   Adam Howell is a 63 y.o male with past medical history of diabetes, hypertension, CAD, Rt AKA, anemia, CHF, and ESRD on dialysis. Patient presents to vascular surgery for revision of AVF. He is being admitted for End stage renal disease (HCC) [N18.6] ESRD (end stage renal disease) (HCC) [N18.6]  Patient had anaphylactic shock prior to his procedure.   Patient had a right femoral temp HD catheter placed and receiving dialysis.   Update:    Patient seen resting in bed Denies current pain or discomfort Partially completed breakfast tray at bedside. No complaints to offer   Objective:  Vital signs in last 24 hours:  Temp:  [98 F (36.7 C)-98.5 F (36.9 C)] 98 F (36.7 C) (06/01 0848) Pulse Rate:  [61-97] 97 (06/01 0848) Resp:  [16-20] 18 (06/01 0848) BP: (96-119)/(55-62) 118/59 (06/01 0848) SpO2:  [82 %-100 %] 82 % (06/01 0848)  Weight change:  Filed Weights   08/12/23 1425 08/13/23 0835 08/13/23 1235  Weight: 97.3 kg 99 kg 98 kg    Intake/Output: I/O last 3 completed shifts: In: 480 [P.O.:480] Out: -    Intake/Output this shift:  No intake/output data recorded.  Physical Exam: General: No acute distress  Head: [place]/AT, hearing intact  Eyes: Anicteric  Lungs:  Clear bilateral  Heart: Regular rate and rhythm  Abdomen:  Soft, nontender, obese  Extremities: No peripheral edema  Right AKA  Neurologic: Alert and oriented  Skin: No lesions  Access: Left aVF (resting) right HD femoral permcath    Basic Metabolic Panel: Recent Labs  Lab 08/09/23 0208 08/10/23 0354 08/11/23 0350 08/12/23 0450 08/13/23 0523 08/14/23 0709  NA 132* 135 133* 136 134* 133*  K 4.0 3.7 3.9 4.0 4.3 4.2  CL 92* 95* 93* 98 97* 96*  CO2 26 28 27 27 27 28   GLUCOSE 149* 144* 204* 143* 188* 142*  BUN 48* 35* 53* 36* 58* 43*  CREATININE 7.38* 5.23* 7.63* 6.13* 8.48* 6.64*  CALCIUM  8.1* 8.3* 8.1* 8.5* 8.5* 8.2*  MG 2.4  --    --   --   --   --   PHOS 4.3 2.8 2.7 2.7 3.9 2.5    Liver Function Tests: Recent Labs  Lab 08/10/23 0354 08/11/23 0350 08/12/23 0450 08/13/23 0523 08/14/23 0709  ALBUMIN  2.8* 2.6* 2.6* 2.7* 2.6*   No results for input(s): "LIPASE", "AMYLASE" in the last 168 hours. No results for input(s): "AMMONIA" in the last 168 hours.  CBC: Recent Labs  Lab 08/10/23 0354 08/11/23 0350 08/12/23 0450 08/13/23 0523 08/14/23 0709  WBC 10.5 12.0* 11.8* 9.1 8.6  HGB 8.9* 8.6* 8.4* 9.0* 8.4*  HCT 27.5* 25.8* 25.8* 27.3* 25.6*  MCV 87.6 86.3 86.6 88.1 88.9  PLT 125* 157 176 222 187    Cardiac Enzymes: No results for input(s): "CKTOTAL", "CKMB", "CKMBINDEX", "TROPONINI" in the last 168 hours.  BNP: Invalid input(s): "POCBNP"  CBG: Recent Labs  Lab 08/14/23 1701 08/14/23 2200 08/15/23 0445 08/15/23 0829 08/15/23 1141  GLUCAP 96 171* 140* 145* 141*    Microbiology: Results for orders placed or performed during the hospital encounter of 08/05/23  Culture, Respiratory w Gram Stain     Status: None   Collection Time: 08/07/23  8:09 AM   Specimen: Tracheal Aspirate; Respiratory  Result Value Ref Range Status   Specimen Description   Final    TRACHEAL ASPIRATE Performed at [facility], [address] [place], [place]  [address]    Special Requests   Final    NONE Performed at [facility], [address] [place], [place] [address]    Gram Stain   Final    FEW WBC PRESENT, PREDOMINANTLY MONONUCLEAR FEW GRAM POSITIVE COCCI IN CLUSTERS Performed at [facility], [address] [place], [place] [address]    Culture FEW METHICILLIN RESISTANT STAPHYLOCOCCUS AUREUS  Final   Report Status 08/10/2023 FINAL  Final   Organism ID, Bacteria METHICILLIN RESISTANT STAPHYLOCOCCUS AUREUS  Final      Susceptibility   Methicillin resistant staphylococcus aureus - MIC*    CIPROFLOXACIN >=8 RESISTANT Resistant     ERYTHROMYCIN >=8 RESISTANT Resistant      GENTAMICIN <=0.5 SENSITIVE Sensitive     OXACILLIN >=4 RESISTANT Resistant     TETRACYCLINE >=16 RESISTANT Resistant     VANCOMYCIN  1 SENSITIVE Sensitive     TRIMETH /SULFA  <=10 SENSITIVE Sensitive     CLINDAMYCIN >=8 RESISTANT Resistant     RIFAMPIN <=0.5 SENSITIVE Sensitive     Inducible Clindamycin NEGATIVE Sensitive     LINEZOLID  2 SENSITIVE Sensitive     * FEW METHICILLIN RESISTANT STAPHYLOCOCCUS AUREUS    Coagulation Studies: No results for input(s): "LABPROT", "INR" in the last 72 hours.   Urinalysis: No results for input(s): "COLORURINE", "LABSPEC", "PHURINE", "GLUCOSEU", "HGBUR", "BILIRUBINUR", "KETONESUR", "PROTEINUR", "UROBILINOGEN", "NITRITE", "LEUKOCYTESUR" in the last 72 hours.  Invalid input(s): "APPERANCEUR"    Imaging: No results found.    Medications:      amiodarone   200 mg Oral Daily   apixaban   2.5 mg Oral BID   vitamin C   500 mg Oral BID   Chlorhexidine  Gluconate Cloth  6 each Topical Q0600   epoetin  alfa-epbx (RETACRIT ) injection  10,000 Units Intravenous Q M,W,F-1800   feeding supplement (NEPRO CARB STEADY)  237 mL Oral TID BM   hydrocerin   Topical Daily   insulin  aspart  0-9 Units Subcutaneous Q4H   iron  polysaccharides  150 mg Oral Daily   ketotifen   1 drop Both Eyes BID   lidocaine   5 mL Intradermal Once   midodrine   10 mg Oral TID   multivitamin  1 tablet Oral QHS   polyethylene glycol  17 g Oral Daily   predniSONE   5 mg Oral Q breakfast   rosuvastatin   40 mg Oral Daily   senna-docusate  2 tablet Oral BID   zinc  sulfate (50mg  elemental zinc )  220 mg Oral Daily   alum & mag hydroxide-simeth, guaiFENesin -dextromethorphan, melatonin, ondansetron , mouth rinse, oxyCODONE -acetaminophen , phenol, potassium chloride   Assessment/ Plan:  Adam Howell is a 63 y.o.  male with end stage renal disease on hemodialysis, diabetes, hypertension, CAD, Rt AKA, anemia, CHF who was admitted to [facility] on 08/05/2023 for revision of AVF. Patient had  anaphylactic reaction and admitted to ICU.   [facility]/MWF/left AVF  End-stage renal disease with hyperkalemia on hemodialysis.  With complication of hemodialysis access.  - appreciate vascular input. Will need to assess to see if his AVF can be salvaged. If not, patient will need a tunneled catheter. - Next treatment scheduled for Monday - Vascular surgery confirms Rt femoral permcath. Fistulagram performed with stent placement. Vascular request 3-4 weeks to rest fistula.    2. Anemia of chronic kidney disease Lab Results  Component Value Date   HGB 8.4 (L) 08/14/2023  Hemoglobin remains decreased. Continue Epogen  10,000 units IV with dialysis treatments.  3. Secondary Hyperparathyroidism: with outpatient labs: PTH 410, phosphorus 4.7,  calcium  8.8 on 07/19/23.    Lab Results  Component Value Date   CALCIUM  8.2 (L) 08/14/2023   CAION 1.06 (L) 08/05/2023   PHOS 2.5 08/14/2023    Will continue to monitor bone minerals.  4. Hypotension: with recent systemic shock.   - Received pressors during this admission.  - Blood pressure now stable.   LOS: 10 Chalonda Schlatter 6/1/202511:43 AM

## 2023-08-15 NOTE — Plan of Care (Signed)
  Problem: Health Behavior/Discharge Planning: Goal: Ability to manage health-related needs will improve Outcome: Progressing   Problem: Respiratory: Goal: Ability to maintain a clear airway and adequate ventilation will improve Outcome: Progressing   Problem: Role Relationship: Goal: Method of communication will improve Outcome: Progressing

## 2023-08-15 NOTE — Plan of Care (Signed)
  Problem: Activity: Goal: Ability to tolerate increased activity will improve Outcome: Progressing   Problem: Respiratory: Goal: Ability to maintain a clear airway and adequate ventilation will improve Outcome: Progressing   Problem: Role Relationship: Goal: Method of communication will improve Outcome: Progressing   Problem: Education: Goal: Knowledge of General Education information will improve Description: Including pain rating scale, medication(s)/side effects and non-pharmacologic comfort measures Outcome: Progressing   Problem: Health Behavior/Discharge Planning: Goal: Ability to manage health-related needs will improve Outcome: Progressing   Problem: Clinical Measurements: Goal: Ability to maintain clinical measurements within normal limits will improve Outcome: Progressing Goal: Will remain free from infection Outcome: Progressing Goal: Diagnostic test results will improve Outcome: Progressing Goal: Respiratory complications will improve Outcome: Progressing Goal: Cardiovascular complication will be avoided Outcome: Progressing   Problem: Activity: Goal: Risk for activity intolerance will decrease Outcome: Progressing   Problem: Nutrition: Goal: Adequate nutrition will be maintained Outcome: Progressing   Problem: Coping: Goal: Level of anxiety will decrease Outcome: Progressing   Problem: Elimination: Goal: Will not experience complications related to bowel motility Outcome: Progressing   Problem: Pain Managment: Goal: General experience of comfort will improve and/or be controlled Outcome: Progressing   Problem: Safety: Goal: Ability to remain free from injury will improve Outcome: Progressing   Problem: Skin Integrity: Goal: Risk for impaired skin integrity will decrease Outcome: Progressing   Problem: Education: Goal: Ability to describe self-care measures that may prevent or decrease complications (Diabetes Survival Skills Education) will  improve Outcome: Progressing Goal: Individualized Educational Video(s) Outcome: Progressing   Problem: Coping: Goal: Ability to adjust to condition or change in health will improve Outcome: Progressing   Problem: Fluid Volume: Goal: Ability to maintain a balanced intake and output will improve Outcome: Progressing   Problem: Health Behavior/Discharge Planning: Goal: Ability to identify and utilize available resources and services will improve Outcome: Progressing Goal: Ability to manage health-related needs will improve Outcome: Progressing   Problem: Metabolic: Goal: Ability to maintain appropriate glucose levels will improve Outcome: Progressing   Problem: Nutritional: Goal: Maintenance of adequate nutrition will improve Outcome: Progressing Goal: Progress toward achieving an optimal weight will improve Outcome: Progressing   Problem: Skin Integrity: Goal: Risk for impaired skin integrity will decrease Outcome: Progressing   Problem: Tissue Perfusion: Goal: Adequacy of tissue perfusion will improve Outcome: Progressing

## 2023-08-15 NOTE — Progress Notes (Signed)
      Daily Progress Note   Assessment/Planning:   POD #3 s/p Mech TE L RC AVF, PTA+S prox & distal Artegraft anast POD #10 s/p failed RIJ TDC, R fem TDC POD #10 s/p ligation of L RC AVF, L radiobasilic AVG with Artegraft  Social work documents SNF placement possibly Monday Continue PT/OT   Subjective  - 3 Days Post-Op   Wants to go home   Objective   Vitals:   08/14/23 0746 08/14/23 1434 08/14/23 1933 08/15/23 0403  BP: (!) 106/54 116/62 119/61 (!) 96/55  Pulse: 61 61 63 62  Resp: 19 18 20 16   Temp: 98.5 F (36.9 C) 98.4 F (36.9 C) 98.5 F (36.9 C) 98.1 F (36.7 C)  TempSrc:    Oral  SpO2: 96% 100% 100% 100%  Weight:      Height:         Intake/Output Summary (Last 24 hours) at 08/15/2023 0811 Last data filed at 08/14/2023 2000 Gross per 24 hour  Intake 240 ml  Output --  Net 240 ml      PULM  CTAB  CV  RRR  GI  soft, NTND  VASC L forearm bruit and thrill, inc healed, large eschar overlying aneurysmal RCAVF w/o thrill, R groin TDC site without any bleeding  NEURO A&O x 3      Laboratory   CBC    Latest Ref Rng & Units 08/14/2023    7:09 AM 08/13/2023    5:23 AM 08/12/2023    4:50 AM  CBC  WBC 4.0 - 10.5 K/uL 8.6  9.1  11.8   Hemoglobin 13.0 - 17.0 g/dL 8.4  9.0  8.4   Hematocrit 39.0 - 52.0 % 25.6  27.3  25.8   Platelets 150 - 400 K/uL 187  222  176     BMET    Component Value Date/Time   NA 133 (L) 08/14/2023 0709   NA 136 04/14/2023 1610   K 4.2 08/14/2023 0709   CL 96 (L) 08/14/2023 0709   CO2 28 08/14/2023 0709   GLUCOSE 142 (H) 08/14/2023 0709   BUN 43 (H) 08/14/2023 0709   BUN 18 04/14/2023 1610   CREATININE 6.64 (H) 08/14/2023 0709   CALCIUM  8.2 (L) 08/14/2023 0709   GFRNONAA 9 (L) 08/14/2023 0709     Roxy Cordial, MD, FACS, FSVS Covering for [facility]: [phone]  08/15/2023, 8:11 AM

## 2023-08-15 NOTE — Progress Notes (Signed)
      Daily Progress Note  Called carrier for peer-to-peer.  Per carrier, pt's recent PT and OT records do not suggest ability to cooperate with rehab, so SNF placement was DECLINED.  - Further placement options will be addressed tomorrow by primary team.   Roxy Cordial, MD, FACS, FSVS Covering for [facility]: [phone]  08/15/2023, 3:09 PM

## 2023-08-15 NOTE — Progress Notes (Signed)
 Progress Note   Patient: Adam Howell XBM:[id number] DOB: [birth year] DOA: 08/05/2023     10 DOS: the patient was seen and examined on 08/15/2023   Brief hospital course: Adam Howell is a 63 y.o. male with past medical history significant for ESRD on HD, HFrEF, hypertension, hyperlipidemia, who presented to the hospital for elective AV fistula revision.   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  The patient became hypotensive with SBP in the 60's concerning for bleeding in the diseased central venous system verses possible IV contrast reaction.  Fluroscopy did show some blood in the pleural cavity concerning for hemothorax.     Femoral temporary Dialysis catheter was placed along with femoral central line placement. Consideration was given for placing a chest tube, but it was felt this may result in increased bleeding as the pressure from the existing hematoma would help tamponade any further venous bleeding. Also of note, the patient had been on Eliquis  up till the day prior to this event and had received heparin  during the revision of his fistula.      Pt remained intubated post procedure requiring vasopressors, and was transferred to ICU.  A chest tube was placed on 5/23 for increased hemothorax, which was removed on 5/25.  Extubated on 5/26.    Principal Problem:   ESRD (end stage renal disease) (HCC) Active Problems:   Type 2 diabetes mellitus with ESRD (end-stage renal disease) (HCC)   Atrial fibrillation, chronic (HCC)   Anemia in chronic kidney disease   Obesity (BMI 30-39.9)   Peripheral vascular disease (HCC)   Chronic pain syndrome   Heart failure with reduced ejection fraction (HCC)   Hypertension   S/P AKA (above knee amputation), right (HCC)   Pressure injury of skin   Shock circulatory (HCC)   Assessment and Plan:  Anaphylactic reaction versus   hemorrhagic shock. Right hemothorax. Acute blood loss anemia. This happened while placing a dialysis catheter.  Condition has improved.   Acute hypoxemic respiratory failure. Condition also improved.   End-stage renal disease on hemodialysis  Hyponatremia. Patient had arterial graft angioplasty and stent placement 5/29. On scheduled hemodialysis.   Chronic atrial fibrillation. Eliquis  restarted.     Chronic diastolic congestive heart failure Severe LV hypertrophy. Currently, patient does not have volume overload.   Type 2 diabetes. Continue sliding scale insulin .  Patient is a pending nursing placement, there is no change in treatment plan.      Subjective:  Patient currently has no complaint.  Physical Exam: Vitals:   08/14/23 1434 08/14/23 1933 08/15/23 0403 08/15/23 0848  BP: 116/62 119/61 (!) 96/55 (!) 118/59  Pulse: 61 63 62 97  Resp: 18 20 16 18   Temp: 98.4 F (36.9 C) 98.5 F (36.9 C) 98.1 F (36.7 C) 98 F (36.7 C)  TempSrc:   Oral   SpO2: 100% 100% 100% (!) 82%  Weight:      Height:       General exam: Appears calm and comfortable  Respiratory system: Clear to auscultation. Respiratory effort normal. Cardiovascular system: S1 & S2 heard, RRR. No JVD, murmurs, rubs, gallops or clicks. No pedal edema. Gastrointestinal system: Abdomen is nondistended, soft and nontender. No organomegaly or masses felt. Normal bowel sounds heard. Central nervous system: Alert and oriented. No focal neurological deficits. Extremities: Status post right AKA. Skin: No rashes, lesions or ulcers Psychiatry: Judgement and insight appear normal.  Mood & affect appropriate.    Data Reviewed:  There are no new results to review at this time.  Family Communication: None  Disposition: Status is: Inpatient Remains inpatient appropriate because: Per vascular surgery.     Time spent: 25 minutes  Author: Donaciano Frizzle, MD 08/15/2023 11:28 AM  For on call review  [email or web address].

## 2023-08-15 NOTE — TOC Progression Note (Signed)
 Transition of Care [facility]) - Progression Note    Patient Details  Name: Adam Howell MRN: [id number] Date of Birth: May 14, 1960  [facility] CM/SW Contact  Loman Risk, RN Phone Number: 08/15/2023, 10:47 AM  Clinical Narrative:     Insurance offering peer to peer for SNF "We are offering the treating practitioner an option to speak with a Wellsite geologist before final determination. Provider to call [phone] option 5. Deadline is 08/16/2023 12:00 PM EDT If no response MD will render determination."  Dr Vonna Guardian and Dr Farrel Hones notified via secure chat  Expected Discharge Plan: Skilled Nursing Facility    Expected Discharge Plan and Services       Living arrangements for the past 2 months: Skilled Nursing Facility Expected Discharge Date: 08/05/23                                     Social Determinants of Health (SDOH) Interventions SDOH Screenings   Food Insecurity: Patient Unable To Answer (08/07/2023)  Housing: Patient Unable To Answer (08/07/2023)  Transportation Needs: Patient Unable To Answer (08/07/2023)  Utilities: Patient Unable To Answer (08/07/2023)  Alcohol Screen: Low Risk  (11/25/2021)  Depression (PHQ2-9): Low Risk  (04/14/2023)  Financial Resource Strain: Low Risk  (11/25/2021)  Physical Activity: Insufficiently Active (11/25/2021)  Social Connections: Moderately Isolated (12/29/2022)  Stress: No Stress Concern Present (11/25/2021)  Tobacco Use: Low Risk  (08/05/2023)    Readmission Risk Interventions    02/16/2023    6:21 PM  Readmission Risk Prevention Plan  Transportation Screening Complete  Palliative Care Screening Not Applicable  Medication Review (RN Care Manager) Complete

## 2023-08-16 ENCOUNTER — Telehealth: Payer: Self-pay | Admitting: Nurse Practitioner

## 2023-08-16 DIAGNOSIS — N186 End stage renal disease: Secondary | ICD-10-CM | POA: Diagnosis not present

## 2023-08-16 DIAGNOSIS — E1122 Type 2 diabetes mellitus with diabetic chronic kidney disease: Secondary | ICD-10-CM | POA: Diagnosis not present

## 2023-08-16 DIAGNOSIS — I482 Chronic atrial fibrillation, unspecified: Secondary | ICD-10-CM | POA: Diagnosis not present

## 2023-08-16 LAB — BASIC METABOLIC PANEL WITH GFR
Anion gap: 11 (ref 5–15)
BUN: 74 mg/dL — ABNORMAL HIGH (ref 8–23)
CO2: 27 mmol/L (ref 22–32)
Calcium: 8.4 mg/dL — ABNORMAL LOW (ref 8.9–10.3)
Chloride: 99 mmol/L (ref 98–111)
Creatinine, Ser: 10.54 mg/dL — ABNORMAL HIGH (ref 0.61–1.24)
GFR, Estimated: 5 mL/min — ABNORMAL LOW (ref >60)
Glucose, Bld: 125 mg/dL — ABNORMAL HIGH (ref 70–99)
Potassium: 4.1 mmol/L (ref 3.5–5.1)
Sodium: 137 mmol/L (ref 135–145)

## 2023-08-16 LAB — CBC
HCT: 24.9 % — ABNORMAL LOW (ref 39.0–52.0)
Hemoglobin: 8 g/dL — ABNORMAL LOW (ref 13.0–17.0)
MCH: 29.1 pg (ref 26.0–34.0)
MCHC: 32.1 g/dL (ref 30.0–36.0)
MCV: 90.5 fL (ref 80.0–100.0)
Platelets: 215 10*3/uL (ref 150–400)
RBC: 2.75 MIL/uL — ABNORMAL LOW (ref 4.22–5.81)
RDW: 19.6 % — ABNORMAL HIGH (ref 11.5–15.5)
WBC: 10 10*3/uL (ref 4.0–10.5)
nRBC: 1.3 % — ABNORMAL HIGH (ref 0.0–0.2)

## 2023-08-16 LAB — GLUCOSE, CAPILLARY
Glucose-Capillary: 108 mg/dL — ABNORMAL HIGH (ref 70–99)
Glucose-Capillary: 120 mg/dL — ABNORMAL HIGH (ref 70–99)

## 2023-08-16 MED ORDER — OXYCODONE-ACETAMINOPHEN 5-325 MG PO TABS: 1.0000 | ORAL_TABLET | ORAL | 0 refills | Status: DC | PRN

## 2023-08-16 MED ORDER — EPOETIN ALFA-EPBX 10000 UNIT/ML IJ SOLN
INTRAMUSCULAR | Status: AC
  Filled 2023-08-16: qty 1

## 2023-08-16 MED ORDER — HEPARIN SODIUM (PORCINE) 1000 UNIT/ML IJ SOLN
INTRAMUSCULAR | Status: AC
Start: 2023-08-16 — End: ?
  Filled 2023-08-16: qty 10

## 2023-08-16 NOTE — Progress Notes (Signed)
 Progress Note   Patient: Adam Howell ZOX:[id number] DOB: 01-05-1961 DOA: 08/05/2023     11 DOS: the patient was seen and examined on 08/16/2023   Brief hospital course: Piero Mustard is a 63 y.o. male with past medical history significant for ESRD on HD, HFrEF, hypertension, hyperlipidemia, who presented to the hospital for elective AV fistula revision.   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  The patient became hypotensive with SBP in the 60's concerning for bleeding in the diseased central venous system verses possible IV contrast reaction.  Fluroscopy did show some blood in the pleural cavity concerning for hemothorax.     Femoral temporary Dialysis catheter was placed along with femoral central line placement. Consideration was given for placing a chest tube, but it was felt this may result in increased bleeding as the pressure from the existing hematoma would help tamponade any further venous bleeding. Also of note, the patient had been on Eliquis  up till the day prior to this event and had received heparin  during the revision of his fistula.      Pt remained intubated post procedure requiring vasopressors, and was transferred to ICU.  A chest tube was placed on 5/23 for increased hemothorax, which was removed on 5/25.  Extubated on 5/26.    Principal Problem:   ESRD (end stage renal disease) (HCC) Active Problems:   Type 2 diabetes mellitus with ESRD (end-stage renal disease) (HCC)   Atrial fibrillation, chronic (HCC)   Anemia in chronic kidney disease   Obesity (BMI 30-39.9)   Peripheral vascular disease (HCC)   Chronic pain syndrome   Heart failure with reduced ejection fraction (HCC)   Hypertension   S/P AKA (above knee amputation), right (HCC)   Pressure injury of skin   Shock circulatory (HCC)   Assessment and Plan: Anaphylactic reaction versus  hemorrhagic  shock. Right hemothorax. Acute blood loss anemia. This happened while placing a dialysis catheter.  Condition has improved.   Acute hypoxemic respiratory failure. Condition also improved.   End-stage renal disease on hemodialysis  Hyponatremia. Patient had arterial graft angioplasty and stent placement 5/29. On scheduled hemodialysis.   Chronic atrial fibrillation. Eliquis  restarted.     Chronic diastolic congestive heart failure Severe LV hypertrophy. Currently, patient does not have volume overload.   Type 2 diabetes. Continue sliding scale insulin .   No new issues, unsafe discharge plan.    Subjective:  Patient has no complaint today.  Physical Exam: Vitals:   08/16/23 1030 08/16/23 1100 08/16/23 1130 08/16/23 1148  BP: 117/80 104/70 95/75 113/63  Pulse: 72 70 77 69  Resp: 17 12 17 20   Temp:    98.4 F (36.9 C)  TempSrc:      SpO2: 100% 100% 99% 100%  Weight:      Height:       General exam: Appears calm and comfortable  Respiratory system: Clear to auscultation. Respiratory effort normal. Cardiovascular system: S1 & S2 heard, RRR. No JVD, murmurs, rubs, gallops or clicks. No pedal edema. Gastrointestinal system: Abdomen is nondistended, soft and nontender. No organomegaly or masses felt. Normal bowel sounds heard. Central nervous system: Alert and oriented x3. No focal neurological deficits. Extremities: Status post a right AKA. Skin: No rashes, lesions or ulcers Psychiatry: Judgement and insight appear normal. Mood & affect appropriate.    Data Reviewed:  There are no new results to review at this  time.  Family Communication: None  Disposition: Status is: Inpatient Remains inpatient appropriate because: Unsafe discharge option.     Time spent: 25 minutes  Author: Donaciano Frizzle, MD 08/16/2023 11:54 AM  For on call review [email or web address].

## 2023-08-16 NOTE — Telephone Encounter (Signed)
 Copied from CRM [id number]. Topic: Medicare AWV >> Aug 16, 2023  9:14 AM Juliana Ocean wrote: Reason for CRM: Called 08/17/2023 to sched AWV - MAILBOX FULL  Rosalee Collins; Care Guide Ambulatory Clinical Support [facility] Direct Dial: [phone]

## 2023-08-16 NOTE — Progress Notes (Signed)
  Received patient in bed to unit.   Informed consent signed and in chart.    TX duration: 3.5hrs     Transported back to floor  Hand-off given to patient's nurse. No c/o and no acute distress noted    Access used: R Femoral hd catheter  Access issues: none   Total UF removed: 2.0L Medication(s) given: retacrit  Post HD VS: wnl Post HD weight: 96.7kg     Bettye Bruins LPN [facility]

## 2023-08-16 NOTE — Plan of Care (Signed)
   Problem: Activity: Goal: Ability to tolerate increased activity will improve Outcome: Progressing   Problem: Respiratory: Goal: Ability to maintain a clear airway and adequate ventilation will improve Outcome: Progressing   Problem: Role Relationship: Goal: Method of communication will improve Outcome: Progressing   Problem: Education: Goal: Knowledge of General Education information will improve Description: Including pain rating scale, medication(s)/side effects and non-pharmacologic comfort measures Outcome: Progressing

## 2023-08-16 NOTE — Progress Notes (Signed)
 [facility]  ROUNDING NOTE   Subjective:   Adam Howell is a 63 y.o male with past medical history of diabetes, hypertension, CAD, Rt AKA, anemia, CHF, and ESRD on dialysis. Patient presents to vascular surgery for revision of AVF. He is being admitted for End stage renal disease (HCC) [N18.6] ESRD (end stage renal disease) (HCC) [N18.6]  Patient had anaphylactic shock prior to his procedure.   Patient had a right femoral temp HD catheter placed and receiving dialysis.   Update:    Patient seen and evaluated during dialysis   HEMODIALYSIS FLOWSHEET:  Blood Flow Rate (mL/min): 349 mL/min Arterial Pressure (mmHg): -213.12 mmHg Venous Pressure (mmHg): 156.56 mmHg TMP (mmHg): 15.96 mmHg Ultrafiltration Rate (mL/min): 765 mL/min Dialysate Flow Rate (mL/min): 300 ml/min Dialysis Fluid Bolus: Normal Saline Bolus Amount (mL): 100 mL  Frustrated with discharge plan, denied rehab placement.    Objective:  Vital signs in last 24 hours:  Temp:  [98.3 F (36.8 C)-98.5 F (36.9 C)] 98.5 F (36.9 C) (06/02 0743) Pulse Rate:  [42-79] 70 (06/02 1100) Resp:  [11-20] 12 (06/02 1100) BP: (102-132)/(53-84) 104/70 (06/02 1100) SpO2:  [97 %-100 %] 100 % (06/02 1100) Weight:  [98.6 kg] 98.6 kg (06/02 0811)  Weight change:  Filed Weights   08/13/23 0835 08/13/23 1235 08/16/23 0811  Weight: 99 kg 98 kg 98.6 kg    Intake/Output: I/O last 3 completed shifts: In: 360 [P.O.:360] Out: -    Intake/Output this shift:  No intake/output data recorded.  Physical Exam: General: No acute distress  Head: [place]/AT, hearing intact  Eyes: Anicteric  Lungs:  Clear bilateral  Heart: Regular rate and rhythm  Abdomen:  Soft, nontender, obese  Extremities: No peripheral edema  Right AKA  Neurologic: Alert and oriented  Skin: No lesions  Access: Left aVF (resting) right HD femoral permcath    Basic Metabolic Panel: Recent Labs  Lab 08/10/23 0354 08/11/23 0350 08/12/23 0450  08/13/23 0523 08/14/23 0709 08/16/23 0533  NA 135 133* 136 134* 133* 137  K 3.7 3.9 4.0 4.3 4.2 4.1  CL 95* 93* 98 97* 96* 99  CO2 28 27 27 27 28 27   GLUCOSE 144* 204* 143* 188* 142* 125*  BUN 35* 53* 36* 58* 43* 74*  CREATININE 5.23* 7.63* 6.13* 8.48* 6.64* 10.54*  CALCIUM  8.3* 8.1* 8.5* 8.5* 8.2* 8.4*  PHOS 2.8 2.7 2.7 3.9 2.5  --     Liver Function Tests: Recent Labs  Lab 08/10/23 0354 08/11/23 0350 08/12/23 0450 08/13/23 0523 08/14/23 0709  ALBUMIN  2.8* 2.6* 2.6* 2.7* 2.6*   No results for input(s): "LIPASE", "AMYLASE" in the last 168 hours. No results for input(s): "AMMONIA" in the last 168 hours.  CBC: Recent Labs  Lab 08/11/23 0350 08/12/23 0450 08/13/23 0523 08/14/23 0709 08/16/23 0533  WBC 12.0* 11.8* 9.1 8.6 10.0  HGB 8.6* 8.4* 9.0* 8.4* 8.0*  HCT 25.8* 25.8* 27.3* 25.6* 24.9*  MCV 86.3 86.6 88.1 88.9 90.5  PLT 157 176 222 187 215    Cardiac Enzymes: No results for input(s): "CKTOTAL", "CKMB", "CKMBINDEX", "TROPONINI" in the last 168 hours.  BNP: Invalid input(s): "POCBNP"  CBG: Recent Labs  Lab 08/15/23 1141 08/15/23 1605 08/15/23 2045 08/16/23 0033 08/16/23 0352  GLUCAP 141* 175* 152* 108* 120*    Microbiology: Results for orders placed or performed during the hospital encounter of 08/05/23  Culture, Respiratory w Gram Stain     Status: None   Collection Time: 08/07/23  8:09 AM  Specimen: Tracheal Aspirate; Respiratory  Result Value Ref Range Status   Specimen Description   Final    TRACHEAL ASPIRATE Performed at [facility], [address], [place], [place] [address]    Special Requests   Final    NONE Performed at [facility], [address] [place], [place] [address]    Gram Stain   Final    FEW WBC PRESENT, PREDOMINANTLY MONONUCLEAR FEW GRAM POSITIVE COCCI IN CLUSTERS Performed at [facility], [address], [place], [place] [address]    Culture FEW METHICILLIN RESISTANT STAPHYLOCOCCUS  AUREUS  Final   Report Status 08/10/2023 FINAL  Final   Organism ID, Bacteria METHICILLIN RESISTANT STAPHYLOCOCCUS AUREUS  Final      Susceptibility   Methicillin resistant staphylococcus aureus - MIC*    CIPROFLOXACIN >=8 RESISTANT Resistant     ERYTHROMYCIN >=8 RESISTANT Resistant     GENTAMICIN <=0.5 SENSITIVE Sensitive     OXACILLIN >=4 RESISTANT Resistant     TETRACYCLINE >=16 RESISTANT Resistant     VANCOMYCIN  1 SENSITIVE Sensitive     TRIMETH /SULFA  <=10 SENSITIVE Sensitive     CLINDAMYCIN >=8 RESISTANT Resistant     RIFAMPIN <=0.5 SENSITIVE Sensitive     Inducible Clindamycin NEGATIVE Sensitive     LINEZOLID  2 SENSITIVE Sensitive     * FEW METHICILLIN RESISTANT STAPHYLOCOCCUS AUREUS    Coagulation Studies: No results for input(s): "LABPROT", "INR" in the last 72 hours.   Urinalysis: No results for input(s): "COLORURINE", "LABSPEC", "PHURINE", "GLUCOSEU", "HGBUR", "BILIRUBINUR", "KETONESUR", "PROTEINUR", "UROBILINOGEN", "NITRITE", "LEUKOCYTESUR" in the last 72 hours.  Invalid input(s): "APPERANCEUR"    Imaging: No results found.    Medications:      amiodarone   200 mg Oral Daily   apixaban   2.5 mg Oral BID   vitamin C   500 mg Oral BID   Chlorhexidine  Gluconate Cloth  6 each Topical Q0600   epoetin  alfa-epbx (RETACRIT ) injection  10,000 Units Intravenous Q M,W,F-1800   feeding supplement (NEPRO CARB STEADY)  237 mL Oral TID BM   hydrocerin   Topical Daily   insulin  aspart  0-9 Units Subcutaneous Q4H   iron  polysaccharides  150 mg Oral Daily   ketotifen   1 drop Both Eyes BID   lidocaine   5 mL Intradermal Once   midodrine   10 mg Oral TID   multivitamin  1 tablet Oral QHS   polyethylene glycol  17 g Oral Daily   predniSONE   5 mg Oral Q breakfast   rosuvastatin   40 mg Oral Daily   senna-docusate  2 tablet Oral BID   zinc  sulfate (50mg  elemental zinc )  220 mg Oral Daily   alum & mag hydroxide-simeth, guaiFENesin -dextromethorphan, melatonin, ondansetron , mouth  rinse, oxyCODONE -acetaminophen , phenol, potassium chloride   Assessment/ Plan:  [name] is a 63 y.o.  male with end stage renal disease on hemodialysis, diabetes, hypertension, CAD, Rt AKA, anemia, CHF who was admitted to [facility] on 08/05/2023 for revision of AVF. Patient had anaphylactic reaction and admitted to ICU.   [facility]/MWF/left AVF  End-stage renal disease with hyperkalemia on hemodialysis.  With complication of hemodialysis access.  - appreciate vascular input. Will need to assess to see if his AVF can be salvaged. If not, patient will need a tunneled catheter. - Receiving treatment today, UF 1.5-2L as tolerated.  - Next treatment scheduled for Wednesday - Vascular surgery confirms Rt femoral permcath. Fistulagram performed with stent placement. Vascular request 3-4 weeks to rest fistula.  2. Anemia of chronic kidney disease Lab Results  Component Value Date   HGB 8.0 (L) 08/16/2023   Continue Epogen  10,000 units IV with dialysis treatments.  3. Secondary Hyperparathyroidism: with outpatient labs: PTH 410, phosphorus 4.7, calcium  8.8 on 07/19/23.    Lab Results  Component Value Date   CALCIUM  8.4 (L) 08/16/2023   CAION 1.06 (L) 08/05/2023   PHOS 2.5 08/14/2023    Calcium  and phosphorus within optimal range.   4. Hypotension: with recent systemic shock.   - Received pressors during this admission.  - Blood pressure 88/76 during dialysis, UF turned off.   LOS: 11 Jonita Hirota 6/2/202511:28 AM

## 2023-08-16 NOTE — Discharge Instructions (Addendum)
 Vascular Surgery Instruction  Do not use left arm AV fistula for hemodialysis for 4 weeks as it needs to heal post thrombectomy.  Patient to be discharged on Eliquis  2.5 mg twice a day, ASA 81 mg daily, and Crestor  40 mg daily.  Patient was counseled not to miss or skip any of his medications as it will affect the outcome of his AV fistula.  Follow-up with vein and vascular surgery in 5-6 weeks as scheduled.

## 2023-08-16 NOTE — Progress Notes (Signed)
 Progress Note    08/16/2023 10:49 AM 4 Days Post-Op  Subjective: Adam Howell is now postop day 4 from the following:  PROCEDURE: 1.  Ultrasound guidance for vascular access to the left radial artery distal to the fistula 2.  Left arm fistulagram  3.   Mechanical thrombectomy to the left radiocephalic AV fistula and Artegraft jump graft all the way up to the basilic vein in the upper arm with the Penumbra Cat 7D catheter 4.  Percutaneous transluminal angioplasty of both anastomosis of the Artegraft jump graft to the forearm cephalic vein as well as the basilic vein in the upper arm with 7 mm diameter angioplasty balloon 5.  Stent placement to the more proximal anastomosis of the Artegraft jump graft near the antecubital fossa with 8 mm diameter by 10 cm length Viabahn stent 6.  Stent placement to the distal anastomosis of the Artegraft jump graft near the wrist with 8 mm diameter by 2.5 cm length Viabahn stent   Vitals:   08/16/23 1000 08/16/23 1030  BP:  117/80  Pulse:  72  Resp:  17  Temp:    SpO2: 100% 100%   Physical Exam: Cardiac:  RRR. Normal S1,S2.  No rubs clicks gallops or murmurs. Lungs: Lungs clear on auscultation throughout, normal labored breathing.  No rales rhonchi or wheezing noted. Incisions: Left forearm with AV graft.  Clean dry and intact.  No hematoma seroma noted. Extremities: Palpable pulses throughout.  Left upper extremity AV graft.  Sutures in place.  History of right AKA. Abdomen: Positive bowel sounds throughout, soft, nontender nondistended. Neurologic: Alert and oriented x 3, answers all questions and follows commands appropriately.  CBC    Component Value Date/Time   WBC 10.0 08/16/2023 0533   RBC 2.75 (L) 08/16/2023 0533   HGB 8.0 (L) 08/16/2023 0533   HGB 16.8 01/12/2023 1414   HCT 24.9 (L) 08/16/2023 0533   HCT 51.0 01/12/2023 1414   PLT 215 08/16/2023 0533   PLT 257 01/12/2023 1414   MCV 90.5 08/16/2023 0533   MCV 88 01/12/2023 1414    MCH 29.1 08/16/2023 0533   MCHC 32.1 08/16/2023 0533   RDW 19.6 (H) 08/16/2023 0533   RDW 14.8 01/12/2023 1414   LYMPHSABS 1.6 08/05/2023 1305   LYMPHSABS 1.0 01/12/2023 1414   MONOABS 0.4 08/05/2023 1305   EOSABS 0.1 08/05/2023 1305   EOSABS 0.2 01/12/2023 1414   BASOSABS 0.1 08/05/2023 1305   BASOSABS 0.1 01/12/2023 1414    BMET    Component Value Date/Time   NA 137 08/16/2023 0533   NA 136 04/14/2023 1610   K 4.1 08/16/2023 0533   CL 99 08/16/2023 0533   CO2 27 08/16/2023 0533   GLUCOSE 125 (H) 08/16/2023 0533   BUN 74 (H) 08/16/2023 0533   BUN 18 04/14/2023 1610   CREATININE 10.54 (H) 08/16/2023 0533   CALCIUM  8.4 (L) 08/16/2023 0533   GFRNONAA 5 (L) 08/16/2023 0533    INR    Component Value Date/Time   INR 1.3 (H) 08/05/2023 1305     Intake/Output Summary (Last 24 hours) at 08/16/2023 1049 Last data filed at 08/15/2023 1300 Gross per 24 hour  Intake 120 ml  Output --  Net 120 ml     Assessment/Plan:  63 y.o. male is s/p See Above 4 Days Post-Op   PLAN Patient has right groin permacatheter.  He will continue to use this for hemodialysis for the next 4 weeks. Okay for patient to discharge back to  SNF.  Patient was declined for authorization to return to SNF.  Patient was questioned about going home today but he denies any actually having a place to live at this point in time.  He is now calling himself homeless and refusing to go back to the people to take care of him because they were stealing his money.  Case management made aware working on this.  Looking into placement in a long-term care facility.   DVT prophylaxis: Heparin  with dialysis and Eliquis  2.5 mg twice a day.   Annamaria Barrette [facility] 08/16/2023 10:49 AM

## 2023-08-16 NOTE — Progress Notes (Signed)
 OT Cancellation Note  Patient Details Name: Adam Howell MRN: [id number] DOB: [birth year]   Cancelled Treatment:    Reason Eval/Treat Not Completed: Patient at procedure or test/ unavailable. Pt at dialysis, will re-attempt OT tx at later date/time as pt is available.   Tanaysia Bhardwaj R., MPH, MS, OTR/L ascom [phone] 08/16/23, 7:57 AM

## 2023-08-16 NOTE — Discharge Summary (Signed)
 [facility]    Discharge Summary    Patient ID:  Adam Howell MRN: [id number] DOB/AGE: 1960/05/31 63 y.o.  Admit date: 08/05/2023 Discharge date: 08/16/2023 Date of Surgery: 08/12/2023 Surgeon: Surgeon(s): Dew, Donald Frost, MD  Admission Diagnosis: End stage renal disease (HCC) [N18.6] ESRD (end stage renal disease) (HCC) [N18.6]  Discharge Diagnoses:  End stage renal disease (HCC) [N18.6] ESRD (end stage renal disease) (HCC) [N18.6]  Secondary Diagnoses: Past Medical History:  Diagnosis Date   Anemia in chronic kidney disease    Cellulitis of left lower extremity    Chronic atrial fibrillation (HCC)    Chronic kidney disease    Congestive heart failure (HCC)    Difficult intubation    End-stage renal disease on hemodialysis (HCC)    Hyperlipidemia    Hypertension    Type 2 diabetes mellitus with complication (HCC)     Procedure(s): A/V Fistulagram  Discharged Condition: good  HPI:  Patient is S/P from:  POD #3 s/p Mech TE L RC AVF, PTA+S prox & distal Artegraft anast POD #10 s/p failed RIJ TDC, R fem Perm Catheter POD #10 s/p ligation of L RC AVF, L radiobasilic AVG with Artegraft  Patient is recovering as expected.  Left radial basilic AVG with Artegraft can be used in 4 weeks from today for hemodialysis.  Until then patient will use right groin permacatheter for dialysis.  Patient received hemodialysis this morning.  No complications to note patient ready for discharge.  Patient is being discharged on Eliquis  2.5 mg daily, aspirin  81 mg daily, and Crestor  40 mg daily.  Patient was counseled as to not miss or skip any of these medications as will affect the outcome of his mechanical thrombectomy of his AV graft.  He verbalizes understanding.  I spent greater than 60 minutes completing this discharge today for Adam Howell.  Hospital Course:  Adam Howell is a 63 y.o. male is S/P Left POD #3 s/p Mech TE L RC AVF, PTA+S  prox & distal Artegraft anast POD #10 s/p failed RIJ TDC, R fem Perm Catheter POD #10 s/p ligation of L RC AVF, L radiobasilic AVG with Artegraft Extubated: POD # 0 Physical Exam:  Alert notes x3, no acute distress Face: Symmetrical.  Tongue is midline. Neck: Trachea is midline.  No swelling or bruising. Cardiovascular: Regular rate and rhythm Pulmonary: Clear to auscultation bilaterally Abdomen: Soft, nontender, nondistended Right groin access: Clean dry and intact.  No swelling or drainage noted. Perm Catheter in place and working well.  Left lower extremity: Thigh soft.  Calf soft.  Extremities warm distally toes.  Hard to palpate pedal pulses however the foot is warm is her good capillary refill. Right lower extremity: Thigh soft.  Right AKA  Neurological: No deficits noted   Post-op wounds:  clean, dry, intact or healing well  Pt. Ambulating, voiding and taking PO diet without difficulty. Pt pain controlled with PO pain meds.  Labs:  As below  Complications: none  Consults:  Treatment Team:  Sheril Dines, MD Donaciano Frizzle, MD  Significant Diagnostic Studies: CBC Lab Results  Component Value Date   WBC 10.0 08/16/2023   HGB 8.0 (L) 08/16/2023   HCT 24.9 (L) 08/16/2023   MCV 90.5 08/16/2023   PLT 215 08/16/2023    BMET    Component Value Date/Time   NA 137 08/16/2023 0533   NA 136 04/14/2023 1610   K 4.1 08/16/2023 0533   CL 99 08/16/2023 0533  CO2 27 08/16/2023 0533   GLUCOSE 125 (H) 08/16/2023 0533   BUN 74 (H) 08/16/2023 0533   BUN 18 04/14/2023 1610   CREATININE 10.54 (H) 08/16/2023 0533   CALCIUM  8.4 (L) 08/16/2023 0533   GFRNONAA 5 (L) 08/16/2023 0533   COAG Lab Results  Component Value Date   INR 1.3 (H) 08/05/2023   INR 1.2 03/05/2022     Disposition:  Discharge to :LTC Discharge Instructions     Call MD for:  redness, tenderness, or signs of infection (pain, swelling, bleeding, redness, odor or green/yellow discharge around  incision site)   Complete by: As directed    Call MD for:  severe or increased pain, loss or decreased feeling  in affected limb(s)   Complete by: As directed    Call MD for:  temperature >100.5   Complete by: As directed    Driving Restrictions   Complete by: As directed    No driving for 24 hours   Lifting restrictions   Complete by: As directed    No lifting for 24 hours   No dressing needed   Complete by: As directed    Replace only if drainage present   Resume previous diet   Complete by: As directed       Allergies as of 08/16/2023       Reactions   Ivp Dye [iodinated Contrast Media] Anaphylaxis   Tramadol  Rash        Medication List     STOP taking these medications    oxyCODONE -acetaminophen  10-325 MG tablet Commonly known as: PERCOCET Replaced by: oxyCODONE -acetaminophen  5-325 MG tablet       TAKE these medications    acetaminophen  325 MG tablet Commonly known as: TYLENOL  Take 2 tablets (650 mg total) by mouth every 6 (six) hours as needed for mild pain (pain score 1-3), fever or headache (or Fever >/= 101).   amiodarone  200 MG tablet Commonly known as: PACERONE  Take 1 tablet (200 mg total) by mouth 2 (two) times daily.   aspirin  EC 81 MG tablet Take 1 tablet (81 mg total) by mouth daily. Swallow whole.   carvedilol  6.25 MG tablet Commonly known as: COREG  Take 6.25 mg by mouth daily.   Eliquis  2.5 MG Tabs tablet Generic drug: apixaban  Take 1 tablet (2.5 mg total) by mouth 2 (two) times daily.   iron  polysaccharides 150 MG capsule Commonly known as: NIFEREX Take 1 capsule (150 mg total) by mouth daily.   melatonin 3 MG Tabs tablet Take 3 mg by mouth at bedtime.   midodrine  5 MG tablet Commonly known as: PROAMATINE  Take 5 mg by mouth as needed (post dialysis hypotension SBP<90 or DBP<55).   ondansetron  4 MG tablet Commonly known as: ZOFRAN  Take 4 mg by mouth every 6 (six) hours as needed for nausea or vomiting.    oxyCODONE -acetaminophen  5-325 MG tablet Commonly known as: PERCOCET/ROXICET Take 1-2 tablets by mouth every 4 (four) hours as needed for moderate pain (pain score 4-6). Replaces: oxyCODONE -acetaminophen  10-325 MG tablet   rosuvastatin  40 MG tablet Commonly known as: CRESTOR  Take 1 tablet (40 mg total) by mouth daily.   Santyl  250 UNIT/GM ointment Generic drug: collagenase  Apply 1 Application topically daily.   senna 8.6 MG Tabs tablet Commonly known as: SENOKOT Take 2 tablets (17.2 mg total) by mouth at bedtime.   sevelamer  carbonate 800 MG tablet Commonly known as: RENVELA  Take 2,400 mg by mouth 3 (three) times daily.   traZODone  100 MG tablet Commonly known  as: DESYREL  Take 1 tablet (100 mg total) by mouth at bedtime.       Verbal and written Discharge instructions given to the patient. Wound care per Discharge AVS  Contact information for follow-up providers     Banes, Fallon E, NP Follow up in 5 week(s).   Specialty: Vascular Surgery Why: with [facility] Contact information: [address] [place] [address] [phone]              Contact information for after-discharge care     Destination     [facility] Preferred SNF .   Service: Skilled Nursing Contact information: [address] [place]  [address] [phone]                     Signed: Annamaria Barrette, NP  08/16/2023, 9:58 AM

## 2023-08-16 NOTE — TOC Transition Note (Signed)
 Transition of Care [facility]) - Discharge Note   Patient Details  Name: Adam Howell MRN: [id number] Date of Birth: 1960/10/26  Transition of Care [facility]) CM/SW Contact:  Loman Risk, RN Phone Number: 08/16/2023, 2:47 PM   Clinical Narrative:     Siegfried Dress denied for STR Anna with [facility] confirms that patient can admit LTC  Patient will DC to: [facility]  Anticipated DC date: 08/16/23  Family notified: Patient request that I call his daughter on file. Unable to reach her due to her phone not being in service.  Patient aware Transport by: [facility]  Per MD patient ready for DC to . RN, patient, , and facility notified of DC. Discharge Summary sent to facility. RN given number for report. DC packet on chart.  TOC signing off.         Patient Goals and CMS Choice            Discharge Placement                       Discharge Plan and Services Additional resources added to the After Visit Summary for                                       Social Drivers of Health (SDOH) Interventions SDOH Screenings   Food Insecurity: Patient Unable To Answer (08/07/2023)  Housing: Patient Unable To Answer (08/07/2023)  Transportation Needs: Patient Unable To Answer (08/07/2023)  Utilities: Patient Unable To Answer (08/07/2023)  Alcohol Screen: Low Risk  (11/25/2021)  Depression (PHQ2-9): Low Risk  (04/14/2023)  Financial Resource Strain: Low Risk  (11/25/2021)  Physical Activity: Insufficiently Active (11/25/2021)  Social Connections: Moderately Isolated (12/29/2022)  Stress: No Stress Concern Present (11/25/2021)  Tobacco Use: Low Risk  (08/05/2023)     Readmission Risk Interventions    02/16/2023    6:21 PM  Readmission Risk Prevention Plan  Transportation Screening Complete  Palliative Care Screening Not Applicable  Medication Review (RN Care Manager) Complete

## 2023-08-17 DIAGNOSIS — I10 Essential (primary) hypertension: Secondary | ICD-10-CM | POA: Diagnosis not present

## 2023-08-18 DIAGNOSIS — L97822 Non-pressure chronic ulcer of other part of left lower leg with fat layer exposed: Secondary | ICD-10-CM | POA: Diagnosis not present

## 2023-08-18 DIAGNOSIS — L97522 Non-pressure chronic ulcer of other part of left foot with fat layer exposed: Secondary | ICD-10-CM | POA: Diagnosis not present

## 2023-08-18 DIAGNOSIS — D638 Anemia in other chronic diseases classified elsewhere: Secondary | ICD-10-CM | POA: Diagnosis not present

## 2023-08-18 DIAGNOSIS — L299 Pruritus, unspecified: Secondary | ICD-10-CM | POA: Diagnosis not present

## 2023-08-18 DIAGNOSIS — N2581 Secondary hyperparathyroidism of renal origin: Secondary | ICD-10-CM | POA: Diagnosis not present

## 2023-08-18 DIAGNOSIS — Z992 Dependence on renal dialysis: Secondary | ICD-10-CM | POA: Diagnosis not present

## 2023-08-18 DIAGNOSIS — N186 End stage renal disease: Secondary | ICD-10-CM | POA: Diagnosis not present

## 2023-08-18 DIAGNOSIS — R52 Pain, unspecified: Secondary | ICD-10-CM | POA: Diagnosis not present

## 2023-08-18 DIAGNOSIS — I87312 Chronic venous hypertension (idiopathic) with ulcer of left lower extremity: Secondary | ICD-10-CM | POA: Diagnosis not present

## 2023-08-18 DIAGNOSIS — A499 Bacterial infection, unspecified: Secondary | ICD-10-CM | POA: Diagnosis not present

## 2023-08-18 DIAGNOSIS — E119 Type 2 diabetes mellitus without complications: Secondary | ICD-10-CM | POA: Diagnosis not present

## 2023-08-18 DIAGNOSIS — D689 Coagulation defect, unspecified: Secondary | ICD-10-CM | POA: Diagnosis not present

## 2023-08-18 DIAGNOSIS — E1129 Type 2 diabetes mellitus with other diabetic kidney complication: Secondary | ICD-10-CM | POA: Diagnosis not present

## 2023-08-18 DIAGNOSIS — E11621 Type 2 diabetes mellitus with foot ulcer: Secondary | ICD-10-CM | POA: Diagnosis not present

## 2023-08-19 DIAGNOSIS — G4700 Insomnia, unspecified: Secondary | ICD-10-CM | POA: Diagnosis not present

## 2023-08-19 DIAGNOSIS — I4891 Unspecified atrial fibrillation: Secondary | ICD-10-CM | POA: Diagnosis not present

## 2023-08-19 DIAGNOSIS — I509 Heart failure, unspecified: Secondary | ICD-10-CM | POA: Diagnosis not present

## 2023-08-19 DIAGNOSIS — Z7901 Long term (current) use of anticoagulants: Secondary | ICD-10-CM | POA: Diagnosis not present

## 2023-08-19 DIAGNOSIS — N186 End stage renal disease: Secondary | ICD-10-CM | POA: Diagnosis not present

## 2023-08-19 DIAGNOSIS — N189 Chronic kidney disease, unspecified: Secondary | ICD-10-CM | POA: Diagnosis not present

## 2023-08-19 DIAGNOSIS — L97822 Non-pressure chronic ulcer of other part of left lower leg with fat layer exposed: Secondary | ICD-10-CM | POA: Diagnosis not present

## 2023-08-19 DIAGNOSIS — Z713 Dietary counseling and surveillance: Secondary | ICD-10-CM | POA: Diagnosis not present

## 2023-08-19 DIAGNOSIS — I959 Hypotension, unspecified: Secondary | ICD-10-CM | POA: Diagnosis not present

## 2023-08-19 DIAGNOSIS — K5900 Constipation, unspecified: Secondary | ICD-10-CM | POA: Diagnosis not present

## 2023-08-19 DIAGNOSIS — Z7982 Long term (current) use of aspirin: Secondary | ICD-10-CM | POA: Diagnosis not present

## 2023-08-19 DIAGNOSIS — Z89611 Acquired absence of right leg above knee: Secondary | ICD-10-CM | POA: Diagnosis not present

## 2023-08-20 DIAGNOSIS — N2581 Secondary hyperparathyroidism of renal origin: Secondary | ICD-10-CM | POA: Diagnosis not present

## 2023-08-20 DIAGNOSIS — R52 Pain, unspecified: Secondary | ICD-10-CM | POA: Diagnosis not present

## 2023-08-20 DIAGNOSIS — D689 Coagulation defect, unspecified: Secondary | ICD-10-CM | POA: Diagnosis not present

## 2023-08-20 DIAGNOSIS — A499 Bacterial infection, unspecified: Secondary | ICD-10-CM | POA: Diagnosis not present

## 2023-08-20 DIAGNOSIS — L299 Pruritus, unspecified: Secondary | ICD-10-CM | POA: Diagnosis not present

## 2023-08-20 DIAGNOSIS — N186 End stage renal disease: Secondary | ICD-10-CM | POA: Diagnosis not present

## 2023-08-20 DIAGNOSIS — Z992 Dependence on renal dialysis: Secondary | ICD-10-CM | POA: Diagnosis not present

## 2023-08-20 DIAGNOSIS — E1129 Type 2 diabetes mellitus with other diabetic kidney complication: Secondary | ICD-10-CM | POA: Diagnosis not present

## 2023-08-22 NOTE — Patient Instructions (Incomplete)

## 2023-08-25 ENCOUNTER — Inpatient Hospital Stay: Admitting: Nurse Practitioner

## 2023-08-25 DIAGNOSIS — N185 Chronic kidney disease, stage 5: Secondary | ICD-10-CM

## 2023-08-25 DIAGNOSIS — E1129 Type 2 diabetes mellitus with other diabetic kidney complication: Secondary | ICD-10-CM | POA: Diagnosis not present

## 2023-08-25 DIAGNOSIS — L97821 Non-pressure chronic ulcer of other part of left lower leg limited to breakdown of skin: Secondary | ICD-10-CM | POA: Diagnosis not present

## 2023-08-25 DIAGNOSIS — E11621 Type 2 diabetes mellitus with foot ulcer: Secondary | ICD-10-CM | POA: Diagnosis not present

## 2023-08-25 DIAGNOSIS — I132 Hypertensive heart and chronic kidney disease with heart failure and with stage 5 chronic kidney disease, or end stage renal disease: Secondary | ICD-10-CM | POA: Diagnosis not present

## 2023-08-25 DIAGNOSIS — Z993 Dependence on wheelchair: Secondary | ICD-10-CM | POA: Diagnosis not present

## 2023-08-25 DIAGNOSIS — R52 Pain, unspecified: Secondary | ICD-10-CM | POA: Diagnosis not present

## 2023-08-25 DIAGNOSIS — N2581 Secondary hyperparathyroidism of renal origin: Secondary | ICD-10-CM | POA: Diagnosis not present

## 2023-08-25 DIAGNOSIS — E785 Hyperlipidemia, unspecified: Secondary | ICD-10-CM | POA: Diagnosis not present

## 2023-08-25 DIAGNOSIS — E1122 Type 2 diabetes mellitus with diabetic chronic kidney disease: Secondary | ICD-10-CM

## 2023-08-25 DIAGNOSIS — N186 End stage renal disease: Secondary | ICD-10-CM | POA: Diagnosis not present

## 2023-08-25 DIAGNOSIS — L97921 Non-pressure chronic ulcer of unspecified part of left lower leg limited to breakdown of skin: Secondary | ICD-10-CM

## 2023-08-25 DIAGNOSIS — Z7901 Long term (current) use of anticoagulants: Secondary | ICD-10-CM | POA: Diagnosis not present

## 2023-08-25 DIAGNOSIS — Z556 Problems related to health literacy: Secondary | ICD-10-CM | POA: Diagnosis not present

## 2023-08-25 DIAGNOSIS — D631 Anemia in chronic kidney disease: Secondary | ICD-10-CM | POA: Diagnosis not present

## 2023-08-25 DIAGNOSIS — I87312 Chronic venous hypertension (idiopathic) with ulcer of left lower extremity: Secondary | ICD-10-CM | POA: Diagnosis not present

## 2023-08-25 DIAGNOSIS — Z992 Dependence on renal dialysis: Secondary | ICD-10-CM | POA: Diagnosis not present

## 2023-08-25 DIAGNOSIS — A499 Bacterial infection, unspecified: Secondary | ICD-10-CM | POA: Diagnosis not present

## 2023-08-25 DIAGNOSIS — L299 Pruritus, unspecified: Secondary | ICD-10-CM | POA: Diagnosis not present

## 2023-08-25 DIAGNOSIS — D689 Coagulation defect, unspecified: Secondary | ICD-10-CM | POA: Diagnosis not present

## 2023-08-25 DIAGNOSIS — Z89611 Acquired absence of right leg above knee: Secondary | ICD-10-CM | POA: Diagnosis not present

## 2023-08-25 DIAGNOSIS — I509 Heart failure, unspecified: Secondary | ICD-10-CM | POA: Diagnosis not present

## 2023-08-25 DIAGNOSIS — L97522 Non-pressure chronic ulcer of other part of left foot with fat layer exposed: Secondary | ICD-10-CM | POA: Diagnosis not present

## 2023-08-25 DIAGNOSIS — E1169 Type 2 diabetes mellitus with other specified complication: Secondary | ICD-10-CM

## 2023-08-27 DIAGNOSIS — L299 Pruritus, unspecified: Secondary | ICD-10-CM | POA: Diagnosis not present

## 2023-08-27 DIAGNOSIS — A499 Bacterial infection, unspecified: Secondary | ICD-10-CM | POA: Diagnosis not present

## 2023-08-27 DIAGNOSIS — N186 End stage renal disease: Secondary | ICD-10-CM | POA: Diagnosis not present

## 2023-08-27 DIAGNOSIS — D689 Coagulation defect, unspecified: Secondary | ICD-10-CM | POA: Diagnosis not present

## 2023-08-27 DIAGNOSIS — N2581 Secondary hyperparathyroidism of renal origin: Secondary | ICD-10-CM | POA: Diagnosis not present

## 2023-08-27 DIAGNOSIS — R52 Pain, unspecified: Secondary | ICD-10-CM | POA: Diagnosis not present

## 2023-08-27 DIAGNOSIS — E1129 Type 2 diabetes mellitus with other diabetic kidney complication: Secondary | ICD-10-CM | POA: Diagnosis not present

## 2023-08-27 DIAGNOSIS — Z992 Dependence on renal dialysis: Secondary | ICD-10-CM | POA: Diagnosis not present

## 2023-08-28 NOTE — Patient Instructions (Incomplete)

## 2023-08-29 ENCOUNTER — Inpatient Hospital Stay
Admission: EM | Admit: 2023-08-29 | Discharge: 2023-09-07 | DRG: 252 | Disposition: A | Discharge status: Home Health | Attending: Student in an Organized Health Care Education/Training Program | Admitting: Student in an Organized Health Care Education/Training Program

## 2023-08-29 ENCOUNTER — Other Ambulatory Visit: Payer: Self-pay

## 2023-08-29 ENCOUNTER — Encounter: Payer: Self-pay | Admitting: Emergency Medicine

## 2023-08-29 ENCOUNTER — Emergency Department

## 2023-08-29 DIAGNOSIS — I2510 Atherosclerotic heart disease of native coronary artery without angina pectoris: Secondary | ICD-10-CM | POA: Diagnosis present

## 2023-08-29 DIAGNOSIS — E875 Hyperkalemia: Secondary | ICD-10-CM | POA: Diagnosis not present

## 2023-08-29 DIAGNOSIS — T80211A Bloodstream infection due to central venous catheter, initial encounter: Secondary | ICD-10-CM | POA: Diagnosis present

## 2023-08-29 DIAGNOSIS — T82868A Thrombosis of vascular prosthetic devices, implants and grafts, initial encounter: Secondary | ICD-10-CM | POA: Diagnosis not present

## 2023-08-29 DIAGNOSIS — Z7901 Long term (current) use of anticoagulants: Secondary | ICD-10-CM

## 2023-08-29 DIAGNOSIS — Z7401 Bed confinement status: Secondary | ICD-10-CM | POA: Diagnosis not present

## 2023-08-29 DIAGNOSIS — B9562 Methicillin resistant Staphylococcus aureus infection as the cause of diseases classified elsewhere: Secondary | ICD-10-CM | POA: Diagnosis present

## 2023-08-29 DIAGNOSIS — I482 Chronic atrial fibrillation, unspecified: Secondary | ICD-10-CM | POA: Diagnosis present

## 2023-08-29 DIAGNOSIS — I132 Hypertensive heart and chronic kidney disease with heart failure and with stage 5 chronic kidney disease, or end stage renal disease: Secondary | ICD-10-CM | POA: Diagnosis present

## 2023-08-29 DIAGNOSIS — T827XXA Infection and inflammatory reaction due to other cardiac and vascular devices, implants and grafts, initial encounter: Secondary | ICD-10-CM | POA: Diagnosis present

## 2023-08-29 DIAGNOSIS — E663 Overweight: Secondary | ICD-10-CM | POA: Diagnosis present

## 2023-08-29 DIAGNOSIS — J189 Pneumonia, unspecified organism: Principal | ICD-10-CM | POA: Diagnosis present

## 2023-08-29 DIAGNOSIS — Y838 Other surgical procedures as the cause of abnormal reaction of the patient, or of later complication, without mention of misadventure at the time of the procedure: Secondary | ICD-10-CM | POA: Diagnosis present

## 2023-08-29 DIAGNOSIS — I959 Hypotension, unspecified: Secondary | ICD-10-CM | POA: Diagnosis present

## 2023-08-29 DIAGNOSIS — E1169 Type 2 diabetes mellitus with other specified complication: Secondary | ICD-10-CM | POA: Diagnosis present

## 2023-08-29 DIAGNOSIS — R059 Cough, unspecified: Secondary | ICD-10-CM | POA: Diagnosis not present

## 2023-08-29 DIAGNOSIS — Z8249 Family history of ischemic heart disease and other diseases of the circulatory system: Secondary | ICD-10-CM

## 2023-08-29 DIAGNOSIS — G894 Chronic pain syndrome: Secondary | ICD-10-CM | POA: Diagnosis present

## 2023-08-29 DIAGNOSIS — Z79899 Other long term (current) drug therapy: Secondary | ICD-10-CM

## 2023-08-29 DIAGNOSIS — R0689 Other abnormalities of breathing: Secondary | ICD-10-CM | POA: Diagnosis not present

## 2023-08-29 DIAGNOSIS — E1151 Type 2 diabetes mellitus with diabetic peripheral angiopathy without gangrene: Secondary | ICD-10-CM | POA: Diagnosis present

## 2023-08-29 DIAGNOSIS — T8249XA Other complication of vascular dialysis catheter, initial encounter: Secondary | ICD-10-CM | POA: Diagnosis not present

## 2023-08-29 DIAGNOSIS — L97529 Non-pressure chronic ulcer of other part of left foot with unspecified severity: Secondary | ICD-10-CM | POA: Diagnosis not present

## 2023-08-29 DIAGNOSIS — E7849 Other hyperlipidemia: Secondary | ICD-10-CM | POA: Diagnosis present

## 2023-08-29 DIAGNOSIS — Z9889 Other specified postprocedural states: Secondary | ICD-10-CM | POA: Diagnosis not present

## 2023-08-29 DIAGNOSIS — Z992 Dependence on renal dialysis: Secondary | ICD-10-CM | POA: Diagnosis not present

## 2023-08-29 DIAGNOSIS — I5032 Chronic diastolic (congestive) heart failure: Secondary | ICD-10-CM | POA: Diagnosis present

## 2023-08-29 DIAGNOSIS — J942 Hemothorax: Secondary | ICD-10-CM | POA: Diagnosis not present

## 2023-08-29 DIAGNOSIS — I5A Non-ischemic myocardial injury (non-traumatic): Secondary | ICD-10-CM | POA: Diagnosis present

## 2023-08-29 DIAGNOSIS — D631 Anemia in chronic kidney disease: Secondary | ICD-10-CM | POA: Diagnosis present

## 2023-08-29 DIAGNOSIS — J9601 Acute respiratory failure with hypoxia: Secondary | ICD-10-CM | POA: Diagnosis present

## 2023-08-29 DIAGNOSIS — R001 Bradycardia, unspecified: Secondary | ICD-10-CM | POA: Diagnosis not present

## 2023-08-29 DIAGNOSIS — B967 Clostridium perfringens [C. perfringens] as the cause of diseases classified elsewhere: Secondary | ICD-10-CM | POA: Diagnosis present

## 2023-08-29 DIAGNOSIS — Z89611 Acquired absence of right leg above knee: Secondary | ICD-10-CM

## 2023-08-29 DIAGNOSIS — Z91041 Radiographic dye allergy status: Secondary | ICD-10-CM

## 2023-08-29 DIAGNOSIS — I38 Endocarditis, valve unspecified: Secondary | ICD-10-CM

## 2023-08-29 DIAGNOSIS — D61818 Other pancytopenia: Secondary | ICD-10-CM | POA: Diagnosis present

## 2023-08-29 DIAGNOSIS — Z4901 Encounter for fitting and adjustment of extracorporeal dialysis catheter: Secondary | ICD-10-CM

## 2023-08-29 DIAGNOSIS — Z87892 Personal history of anaphylaxis: Secondary | ICD-10-CM

## 2023-08-29 DIAGNOSIS — R7881 Bacteremia: Secondary | ICD-10-CM | POA: Diagnosis present

## 2023-08-29 DIAGNOSIS — N2581 Secondary hyperparathyroidism of renal origin: Secondary | ICD-10-CM | POA: Diagnosis not present

## 2023-08-29 DIAGNOSIS — X58XXXA Exposure to other specified factors, initial encounter: Secondary | ICD-10-CM | POA: Diagnosis present

## 2023-08-29 DIAGNOSIS — E785 Hyperlipidemia, unspecified: Secondary | ICD-10-CM | POA: Diagnosis not present

## 2023-08-29 DIAGNOSIS — E1122 Type 2 diabetes mellitus with diabetic chronic kidney disease: Secondary | ICD-10-CM | POA: Diagnosis present

## 2023-08-29 DIAGNOSIS — I361 Nonrheumatic tricuspid (valve) insufficiency: Secondary | ICD-10-CM | POA: Diagnosis not present

## 2023-08-29 DIAGNOSIS — J690 Pneumonitis due to inhalation of food and vomit: Secondary | ICD-10-CM | POA: Diagnosis present

## 2023-08-29 DIAGNOSIS — R590 Localized enlarged lymph nodes: Secondary | ICD-10-CM | POA: Diagnosis not present

## 2023-08-29 DIAGNOSIS — Z95828 Presence of other vascular implants and grafts: Secondary | ICD-10-CM | POA: Diagnosis not present

## 2023-08-29 DIAGNOSIS — A419 Sepsis, unspecified organism: Secondary | ICD-10-CM | POA: Diagnosis not present

## 2023-08-29 DIAGNOSIS — N186 End stage renal disease: Secondary | ICD-10-CM | POA: Diagnosis present

## 2023-08-29 DIAGNOSIS — Z885 Allergy status to narcotic agent status: Secondary | ICD-10-CM

## 2023-08-29 DIAGNOSIS — Y95 Nosocomial condition: Secondary | ICD-10-CM | POA: Diagnosis present

## 2023-08-29 DIAGNOSIS — I87312 Chronic venous hypertension (idiopathic) with ulcer of left lower extremity: Secondary | ICD-10-CM | POA: Diagnosis not present

## 2023-08-29 DIAGNOSIS — Z7982 Long term (current) use of aspirin: Secondary | ICD-10-CM

## 2023-08-29 DIAGNOSIS — L97829 Non-pressure chronic ulcer of other part of left lower leg with unspecified severity: Secondary | ICD-10-CM | POA: Diagnosis not present

## 2023-08-29 DIAGNOSIS — L97522 Non-pressure chronic ulcer of other part of left foot with fat layer exposed: Secondary | ICD-10-CM | POA: Diagnosis not present

## 2023-08-29 DIAGNOSIS — Z6829 Body mass index (BMI) 29.0-29.9, adult: Secondary | ICD-10-CM

## 2023-08-29 DIAGNOSIS — I509 Heart failure, unspecified: Secondary | ICD-10-CM | POA: Diagnosis not present

## 2023-08-29 DIAGNOSIS — E11621 Type 2 diabetes mellitus with foot ulcer: Secondary | ICD-10-CM | POA: Diagnosis not present

## 2023-08-29 DIAGNOSIS — L97821 Non-pressure chronic ulcer of other part of left lower leg limited to breakdown of skin: Secondary | ICD-10-CM | POA: Diagnosis not present

## 2023-08-29 DIAGNOSIS — S81802A Unspecified open wound, left lower leg, initial encounter: Secondary | ICD-10-CM | POA: Diagnosis present

## 2023-08-29 DIAGNOSIS — Z743 Need for continuous supervision: Secondary | ICD-10-CM | POA: Diagnosis not present

## 2023-08-29 LAB — CBC WITH DIFFERENTIAL/PLATELET
Abs Immature Granulocytes: 0.02 10*3/uL (ref 0.00–0.07)
Basophils Absolute: 0 10*3/uL (ref 0.0–0.1)
Basophils Relative: 1 %
Eosinophils Absolute: 0.2 10*3/uL (ref 0.0–0.5)
Eosinophils Relative: 4 %
HCT: 25.1 % — ABNORMAL LOW (ref 39.0–52.0)
Hemoglobin: 7.4 g/dL — ABNORMAL LOW (ref 13.0–17.0)
Immature Granulocytes: 1 %
Lymphocytes Relative: 19 %
Lymphs Abs: 0.7 10*3/uL (ref 0.7–4.0)
MCH: 27.9 pg (ref 26.0–34.0)
MCHC: 29.5 g/dL — ABNORMAL LOW (ref 30.0–36.0)
MCV: 94.7 fL (ref 80.0–100.0)
Monocytes Absolute: 0.5 10*3/uL (ref 0.1–1.0)
Monocytes Relative: 13 %
Neutro Abs: 2.4 10*3/uL (ref 1.7–7.7)
Neutrophils Relative %: 62 %
Platelets: 95 10*3/uL — ABNORMAL LOW (ref 150–400)
RBC: 2.65 MIL/uL — ABNORMAL LOW (ref 4.22–5.81)
RDW: 19.6 % — ABNORMAL HIGH (ref 11.5–15.5)
WBC: 3.8 10*3/uL — ABNORMAL LOW (ref 4.0–10.5)
nRBC: 0 % (ref 0.0–0.2)

## 2023-08-29 LAB — COMPREHENSIVE METABOLIC PANEL WITH GFR
ALT: 13 U/L (ref 0–44)
AST: 22 U/L (ref 15–41)
Albumin: 2.7 g/dL — ABNORMAL LOW (ref 3.5–5.0)
Alkaline Phosphatase: 69 U/L (ref 38–126)
Anion gap: 15 (ref 5–15)
BUN: 41 mg/dL — ABNORMAL HIGH (ref 8–23)
CO2: 25 mmol/L (ref 22–32)
Calcium: 8.3 mg/dL — ABNORMAL LOW (ref 8.9–10.3)
Chloride: 98 mmol/L (ref 98–111)
Creatinine, Ser: 9.88 mg/dL — ABNORMAL HIGH (ref 0.61–1.24)
GFR, Estimated: 5 mL/min — ABNORMAL LOW (ref >60)
Glucose, Bld: 97 mg/dL (ref 70–99)
Potassium: 4.4 mmol/L (ref 3.5–5.1)
Sodium: 138 mmol/L (ref 135–145)
Total Bilirubin: 0.9 mg/dL (ref 0.0–1.2)
Total Protein: 6.7 g/dL (ref 6.5–8.1)

## 2023-08-29 LAB — RESP PANEL BY RT-PCR (RSV, FLU A&B, COVID)  RVPGX2
Influenza A by PCR: NEGATIVE
Influenza B by PCR: NEGATIVE
Resp Syncytial Virus by PCR: NEGATIVE
SARS Coronavirus 2 by RT PCR: NEGATIVE

## 2023-08-29 LAB — TROPONIN I (HIGH SENSITIVITY): Troponin I (High Sensitivity): 107 ng/L (ref <18)

## 2023-08-29 MED ORDER — DIPHENHYDRAMINE HCL 50 MG/ML IJ SOLN: 12.5000 mg | Freq: Three times a day (TID) | INTRAMUSCULAR | Status: DC | PRN

## 2023-08-29 MED ORDER — APIXABAN 2.5 MG PO TABS
2.5000 mg | ORAL_TABLET | Freq: Two times a day (BID) | ORAL | Status: DC
  Administered 2023-08-30 – 2023-09-07 (×17): 2.5 mg via ORAL
  Filled 2023-08-29 (×17): qty 1

## 2023-08-29 MED ORDER — VANCOMYCIN HCL IN DEXTROSE 1-5 GM/200ML-% IV SOLN: 1000.0000 mg | Freq: Once | INTRAVENOUS | Status: DC

## 2023-08-29 MED ORDER — ACETAMINOPHEN 325 MG PO TABS
650.0000 mg | ORAL_TABLET | Freq: Four times a day (QID) | ORAL | Status: DC | PRN
  Administered 2023-08-30 – 2023-09-04 (×2): 650 mg via ORAL
  Filled 2023-08-29 (×2): qty 2

## 2023-08-29 MED ORDER — VANCOMYCIN HCL 2000 MG/400ML IV SOLN
2000.0000 mg | Freq: Once | INTRAVENOUS | Status: DC
  Filled 2023-08-29: qty 400

## 2023-08-29 MED ORDER — SEVELAMER CARBONATE 800 MG PO TABS
2400.0000 mg | ORAL_TABLET | Freq: Three times a day (TID) | ORAL | Status: DC
  Administered 2023-08-30 – 2023-09-07 (×21): 2400 mg via ORAL
  Filled 2023-08-29 (×22): qty 3

## 2023-08-29 MED ORDER — ALBUTEROL SULFATE HFA 108 (90 BASE) MCG/ACT IN AERS: 2.0000 | INHALATION_SPRAY | RESPIRATORY_TRACT | Status: DC | PRN

## 2023-08-29 MED ORDER — SODIUM CHLORIDE 0.9 % IV SOLN
2.0000 g | Freq: Once | INTRAVENOUS | Status: AC
Start: 2023-08-29 — End: ?

## 2023-08-29 MED ORDER — AMIODARONE HCL 200 MG PO TABS
200.0000 mg | ORAL_TABLET | Freq: Two times a day (BID) | ORAL | Status: DC
  Administered 2023-08-30 – 2023-09-07 (×15): 200 mg via ORAL
  Filled 2023-08-29 (×17): qty 1

## 2023-08-29 MED ORDER — TRAZODONE HCL 100 MG PO TABS
100.0000 mg | ORAL_TABLET | Freq: Every day | ORAL | Status: DC
  Administered 2023-08-30 – 2023-09-06 (×9): 100 mg via ORAL
  Filled 2023-08-29 (×9): qty 1

## 2023-08-29 MED ORDER — ASPIRIN 81 MG PO TBEC
81.0000 mg | DELAYED_RELEASE_TABLET | Freq: Every day | ORAL | Status: DC
  Administered 2023-08-30 – 2023-09-07 (×8): 81 mg via ORAL
  Filled 2023-08-29 (×7): qty 1

## 2023-08-29 MED ORDER — MELATONIN 5 MG PO TABS
2.5000 mg | ORAL_TABLET | Freq: Every evening | ORAL | Status: DC | PRN
  Administered 2023-08-30: 2.5 mg via ORAL
  Filled 2023-08-29: qty 1

## 2023-08-29 MED ORDER — SODIUM CHLORIDE 0.9 % IV SOLN
500.0000 mg | Freq: Once | INTRAVENOUS | Status: AC
Start: 2023-08-29 — End: ?

## 2023-08-29 MED ORDER — MIDODRINE HCL 5 MG PO TABS
5.0000 mg | ORAL_TABLET | ORAL | Status: DC | PRN
  Administered 2023-08-30 – 2023-09-06 (×4): 5 mg via ORAL
  Filled 2023-08-29 (×4): qty 1

## 2023-08-29 MED ORDER — DM-GUAIFENESIN ER 30-600 MG PO TB12
1.0000 | ORAL_TABLET | Freq: Two times a day (BID) | ORAL | Status: DC | PRN
  Administered 2023-08-30 – 2023-09-07 (×9): 1 via ORAL
  Filled 2023-08-29 (×9): qty 1

## 2023-08-29 MED ORDER — CARVEDILOL 6.25 MG PO TABS
6.2500 mg | ORAL_TABLET | Freq: Every day | ORAL | Status: DC
  Administered 2023-09-01 – 2023-09-07 (×5): 6.25 mg via ORAL
  Filled 2023-08-29 (×7): qty 1

## 2023-08-29 MED ORDER — OXYCODONE-ACETAMINOPHEN 5-325 MG PO TABS
1.0000 | ORAL_TABLET | ORAL | Status: DC | PRN
  Administered 2023-08-30: 1 via ORAL
  Administered 2023-08-31 – 2023-09-07 (×6): 2 via ORAL
  Filled 2023-08-29 (×5): qty 2
  Filled 2023-08-29: qty 1
  Filled 2023-08-29: qty 2

## 2023-08-29 MED ORDER — POLYSACCHARIDE IRON COMPLEX 150 MG PO CAPS
150.0000 mg | ORAL_CAPSULE | Freq: Every day | ORAL | Status: DC
  Administered 2023-08-30 – 2023-09-07 (×8): 150 mg via ORAL
  Filled 2023-08-29 (×9): qty 1

## 2023-08-29 MED ORDER — ROSUVASTATIN CALCIUM 10 MG PO TABS
40.0000 mg | ORAL_TABLET | Freq: Every day | ORAL | Status: DC
  Administered 2023-08-30 – 2023-09-07 (×8): 40 mg via ORAL
  Filled 2023-08-29 (×5): qty 4
  Filled 2023-08-29: qty 2
  Filled 2023-08-29 (×2): qty 4

## 2023-08-29 MED ORDER — ALBUTEROL SULFATE (2.5 MG/3ML) 0.083% IN NEBU
2.5000 mg | INHALATION_SOLUTION | RESPIRATORY_TRACT | Status: DC | PRN
  Administered 2023-09-05: 2.5 mg via RESPIRATORY_TRACT
  Filled 2023-08-29: qty 3

## 2023-08-29 NOTE — ED Triage Notes (Signed)
 Patient from home via [facility] with c/o shortness of breath, cough, no sense of taste for several days. Recently intubated for sepsis and discharged a few days ago. R BKA, DM2, hemodialysis with fistula in the right leg. Lung sounds clear bilaterally per EMS report. 91% on room air, improved to 100% on 2L en route.

## 2023-08-29 NOTE — ED Notes (Signed)
 Patient transported to CT

## 2023-08-29 NOTE — H&P (Incomplete)
 History and Physical    Cross [name]:[id number] DOB: 09/01/1960 DOA: 08/29/2023  Referring MD/NP/PA:   PCP: Lemar Pyles, NP   Patient coming from:  The patient is coming from home.     Chief Complaint: SOB  HPI: Adam Howell is a 63 y.o. male with medical history significant of ESRD-HD (MWF), HTN, HLD, DM, CAD, dCHF, A fib on Eliquis , amenia, chronic pain, s/p of right AKA, who presents with SOB.  Patient recently had complicated admission from 5/22 - 5/29.  At that time he presented for an elective AV fistula revision.  During admission he had hypotension with PermCath placement which was concerning for bleeding and possible IV contrast reaction, required intubation and vasopressor support, extubated on 5/26.  Pt was discharged with plans for dialysis using right groin permacatheter.    Patient states that he has dry cough congestion and SOB in the past 3 days, which has been progressively worsening.  No chest pain, fever or chills.  No nausea, vomiting, diarrhea or abdominal pain.  No symptoms of UTI.  Denies dark stool or rectal bleeding.  Patient had dialysis on Friday.   Data reviewed independently and ED Course: pt was found to have pancytopenia with WBC 3.8, hemoglobin 7.4 and platelet 95 (patient had WBC 10.0, hemoglobin 8.0 and platelet 215 on 08/16/2023), negative PCR for COVID, flu and RSV, troponin 107, potassium 4.4, bicarbonate 25, creatinine 9.88, BUN 41.  Temperature normal, blood pressure 98/61, heart rate 59, RR 24, oxygen saturation 95% on room air, which improved to 100% on 2 L oxygen.  Patient is admitted to telemetry bed as inpatient.  CT of chest: 1. Scattered bilateral nodular ground-glass airspace disease, greatest in the lower lobes, favor bilateral pneumonia given clinical presentation. 2. Cardiomegaly. 3. Borderline mediastinal adenopathy, likely reactive. 4. Aortic Atherosclerosis (ICD10-I70.0). Coronary artery atherosclerosis.    EKG:  I have personally reviewed.  A-fib, QTc 524, low voltage, poor R wave progression, heart rate 57   Review of Systems:   General: no fevers, chills, no body weight gain, has fatigue HEENT: no blurry vision, hearing changes or sore throat Respiratory: has dyspnea, coughing, no wheezing CV: no chest pain, no palpitations GI: no nausea, vomiting, abdominal pain, diarrhea, constipation GU: no dysuria, burning on urination, increased urinary frequency, hematuria  Ext: has trace leg edema Neuro: no unilateral weakness, numbness, or tingling, no vision change or hearing loss Skin: no rash, no skin tear. MSK: No muscle spasm, no deformity, no limitation of range of movement in spin Heme: No easy bruising.  Travel history: No recent long distant travel.   Allergy:  Allergies  Allergen Reactions  . Ivp Dye [Iodinated Contrast Media] Anaphylaxis  . Tramadol  Rash    Past Medical History:  Diagnosis Date  . Anemia in chronic kidney disease   . Cellulitis of left lower extremity   . Chronic atrial fibrillation (HCC)   . Chronic kidney disease   . Congestive heart failure (HCC)   . Difficult intubation   . End-stage renal disease on hemodialysis (HCC)   . Hyperlipidemia   . Hypertension   . Type 2 diabetes mellitus with complication [facility])     Past Surgical History:  Procedure Laterality Date  . A/V FISTULAGRAM Left 08/27/2022   Procedure: A/V Fistulagram;  Surgeon: Celso College, MD;  Location: [facility] INVASIVE CV LAB;  Service: Cardiovascular;  Laterality: Left;  . A/V FISTULAGRAM Left 08/12/2023   Procedure: A/V Fistulagram;  Surgeon: Celso College, MD;  Location: [facility];  Service: Cardiovascular;  Laterality: Left;  . AMPUTATION Right 03/13/2022   Procedure: AMPUTATION ABOVE KNEE;  Surgeon: Celso College, MD;  Location: [facility] ORS;  Service: General;  Laterality: Right;  . APPLICATION OF WOUND VAC Right 03/07/2022   Procedure: APPLICATION OF WOUND VAC;  Surgeon: Lesta Rater, MD;  Location: [facility] ORS;  Service: Vascular;  Laterality: Right;  GAAC [id number]  . CENTRAL LINE INSERTION Left 08/05/2023   Procedure: CENTRAL LINE INSERTION;  Surgeon: Celso College, MD;  Location: [facility] ORS;  Service: Vascular;  Laterality: Left;  . DIALYSIS/PERMA CATHETER INSERTION Right 08/05/2023   Procedure: DIALYSIS/PERMA CATHETER INSERTION;  Surgeon: Celso College, MD;  Location: [facility] ORS;  Service: Vascular;  Laterality: Right;  . GROIN DISSECTION Right 08/05/2023   Procedure: right groin venogram, angioplasty of superior venacava and anomic vein;  Surgeon: Celso College, MD;  Location: [facility] OR[facility];  Service: Vascular;  Laterality: Right;  . LOWER EXTREMITY ANGIOGRAPHY Left 02/15/2023   Procedure: Lower Extremity Angiography;  Surgeon: Celso College, MD;  Location: [facility];  Service: Cardiovascular;  Laterality: Left;  . REVISON OF ARTERIOVENOUS FISTULA Left 08/05/2023   Procedure: REVISON OF ARTERIOVENOUS FISTULA;  Surgeon: Celso College, MD;  Location: [facility] ORS;  Service: Vascular;  Laterality: Left;  JUMP GRAFT  . WOUND DEBRIDEMENT Left   . WOUND DEBRIDEMENT Right 01/16/2022   Procedure: DEBRIDEMENT WOUND;  Surgeon: Jackquelyn Mass, MD;  Location: [facility] ORS;  Service: Vascular;  Laterality: Right;  Wound vac placement  . WOUND DEBRIDEMENT Right 03/07/2022   Procedure: DEBRIDEMENT WOUND RIGHT LOWER EXTREMITY;  Surgeon: Lesta Rater, MD;  Location: [facility] ORS;  Service: Vascular;  Laterality: Right;    Social History:  reports that he has never smoked. He has never used smokeless tobacco. He reports that he does not currently use alcohol. He reports that he does not use drugs.  Family History:  Family History  Problem Relation Age of Onset  . Heart failure Mother   . Cirrhosis Father   . Hypertension Daughter      Prior to Admission medications   Medication Sig Start Date End Date Taking? Authorizing Provider  acetaminophen  (TYLENOL ) 325 MG tablet Take 2 tablets (650 mg  total) by mouth every 6 (six) hours as needed for mild pain (pain score 1-3), fever or headache (or Fever >/= 101). 03/04/23   Althia Atlas, MD  amiodarone  (PACERONE ) 200 MG tablet Take 1 tablet (200 mg total) by mouth 2 (two) times daily. 10/12/22   Cannady, Jolene T, NP  aspirin  EC 81 MG tablet Take 1 tablet (81 mg total) by mouth daily. Swallow whole. 03/05/23   Althia Atlas, MD  carvedilol  (COREG ) 6.25 MG tablet Take 6.25 mg by mouth daily. 06/25/23   [provider]  ELIQUIS  2.5 MG TABS tablet Take 1 tablet (2.5 mg total) by mouth 2 (two) times daily. 10/12/22   Cannady, Jolene T, NP  iron  polysaccharides (NIFEREX) 150 MG capsule Take 1 capsule (150 mg total) by mouth daily. 03/05/23   Althia Atlas, MD  melatonin 3 MG TABS tablet Take 3 mg by mouth at bedtime.    [provider]  midodrine  (PROAMATINE ) 5 MG tablet Take 5 mg by mouth as needed (post dialysis hypotension SBP<90 or DBP<55).    [provider]  ondansetron  (ZOFRAN ) 4 MG tablet Take 4 mg by mouth every 6 (six) hours as needed for nausea or vomiting. 08/01/23  [provider]  oxyCODONE -acetaminophen  (PERCOCET/ROXICET) 5-325 MG tablet Take 1-2 tablets by mouth every 4 (four) hours as needed for moderate pain (pain score 4-6). 08/16/23   Pace, Valentin Gaskins, NP  rosuvastatin  (CRESTOR ) 40 MG tablet Take 1 tablet (40 mg total) by mouth daily. 10/12/22   Cannady, Jolene T, NP  SANTYL  250 UNIT/GM ointment Apply 1 Application topically daily. 02/08/23   [provider]  senna (SENOKOT) 8.6 MG TABS tablet Take 2 tablets (17.2 mg total) by mouth at bedtime. 04/03/22   Love, Renay Carota, PA-C  sevelamer  carbonate (RENVELA ) 800 MG tablet Take 2,400 mg by mouth 3 (three) times daily. 09/21/22   [provider]  traZODone  (DESYREL ) 100 MG tablet Take 1 tablet (100 mg total) by mouth at bedtime. 01/12/23   Doran Galloway T, NP    Physical Exam: Vitals:   08/29/23 1958 08/29/23 2030 08/29/23 2300  BP: (!)  99/54 98/61 (!) 100/59  Pulse: 61 (!) 59   Resp: 20 (!) 24 19  Temp: 98.9 F (37.2 C)    TempSrc: Oral    SpO2: 98% 100% 98%  Weight: 106.6 kg    Height: 6' 3 (1.905 m)     General: Not in acute distress HEENT:       Eyes: PERRL, EOMI, no jaundice       ENT: No discharge from the ears and nose, no pharynx injection, no tonsillar enlargement.        Neck: No JVD, no bruit, no mass felt. Heme: No neck lymph node enlargement. Cardiac: S1/S2, RRR, No murmurs, No gallops or rubs. Respiratory: Has coarse breathing sound bilaterally GI: Soft, nondistended, nontender, no rebound pain, no organomegaly, BS present. GU: No hematuria Ext: has trace leg edema bilaterally. 1+DP/PT pulse bilaterally. Musculoskeletal: No joint deformities, No joint redness or warmth, no limitation of ROM in spin. Skin: No rashes.  Neuro: Alert, oriented X3, cranial nerves II-XII grossly intact, moves all extremities normally.   Psych: Patient is not psychotic, no suicidal or hemocidal ideation.  Labs on Admission: I have personally reviewed following labs and imaging studies  CBC: Recent Labs  Lab 08/29/23 1958  WBC 3.8*  NEUTROABS 2.4  HGB 7.4*  HCT 25.1*  MCV 94.7  PLT 95*   Basic Metabolic Panel: Recent Labs  Lab 08/29/23 1958  NA 138  K 4.4  CL 98  CO2 25  GLUCOSE 97  BUN 41*  CREATININE 9.88*  CALCIUM  8.3*   GFR: Estimated Creatinine Clearance: 10.2 mL/min (A) (by C-G formula based on SCr of 9.88 mg/dL (H)). Liver Function Tests: Recent Labs  Lab 08/29/23 1958  AST 22  ALT 13  ALKPHOS 69  BILITOT 0.9  PROT 6.7  ALBUMIN  2.7*   No results for input(s): LIPASE, AMYLASE in the last 168 hours. No results for input(s): AMMONIA in the last 168 hours. Coagulation Profile: No results for input(s): INR, PROTIME in the last 168 hours. Cardiac Enzymes: No results for input(s): CKTOTAL, CKMB, CKMBINDEX, TROPONINI in the last 168 hours. BNP (last 3 results) No  results for input(s): PROBNP in the last 8760 hours. HbA1C: No results for input(s): HGBA1C in the last 72 hours. CBG: No results for input(s): GLUCAP in the last 168 hours. Lipid Profile: No results for input(s): CHOL, HDL, LDLCALC, TRIG, CHOLHDL, LDLDIRECT in the last 72 hours. Thyroid  Function Tests: No results for input(s): TSH, T4TOTAL, FREET4, T3FREE, THYROIDAB in the last 72 hours. Anemia Panel: No results for input(s): VITAMINB12, FOLATE, FERRITIN, TIBC,  IRON , RETICCTPCT in the last 72 hours. Urine analysis: No results found for: COLORURINE, APPEARANCEUR, LABSPEC, PHURINE, GLUCOSEU, HGBUR, BILIRUBINUR, KETONESUR, PROTEINUR, UROBILINOGEN, NITRITE, LEUKOCYTESUR Sepsis Labs: @LABRCNTIP (procalcitonin:4,lacticidven:4) ) Recent Results (from the past 240 hours)  Resp panel by RT-PCR (RSV, Flu A&B, Covid) Anterior Nasal Swab     Status: None   Collection Time: 08/29/23  8:00 PM   Specimen: Anterior Nasal Swab  Result Value Ref Range Status   SARS Coronavirus 2 by RT PCR NEGATIVE NEGATIVE Final    Comment: (NOTE) SARS-CoV-2 target nucleic acids are NOT DETECTED.  The SARS-CoV-2 RNA is generally detectable in upper respiratory specimens during the acute phase of infection. The lowest concentration of SARS-CoV-2 viral copies this assay can detect is 138 copies/mL. A negative result does not preclude SARS-Cov-2 infection and should not be used as the sole basis for treatment or other patient management decisions. A negative result may occur with  improper specimen collection/handling, submission of specimen other than nasopharyngeal swab, presence of viral mutation(s) within the areas targeted by this assay, and inadequate number of viral copies(<138 copies/mL). A negative result must be combined with clinical observations, patient history, and epidemiological information. The expected result is Negative.  Fact Sheet  for Patients:  BloggerCourse.com  Fact Sheet for Healthcare Providers:  SeriousBroker.it  This test is no t yet approved or cleared by the [place]  FDA and  has been authorized for detection and/or diagnosis of SARS-CoV-2 by FDA under an Emergency Use Authorization (EUA). This EUA will remain  in effect (meaning this test can be used) for the duration of the COVID-19 declaration under Section 564(b)(1) of the Act, 21 U.S.C.section 360bbb-3(b)(1), unless the authorization is terminated  or revoked sooner.       Influenza A by PCR NEGATIVE NEGATIVE Final   Influenza B by PCR NEGATIVE NEGATIVE Final    Comment: (NOTE) The Xpert Xpress SARS-CoV-2/FLU/RSV plus assay is intended as an aid in the diagnosis of influenza from Nasopharyngeal swab specimens and should not be used as a sole basis for treatment. Nasal washings and aspirates are unacceptable for Xpert Xpress SARS-CoV-2/FLU/RSV testing.  Fact Sheet for Patients: BloggerCourse.com  Fact Sheet for Healthcare Providers: SeriousBroker.it  This test is not yet approved or cleared by the [place]  FDA and has been authorized for detection and/or diagnosis of SARS-CoV-2 by FDA under an Emergency Use Authorization (EUA). This EUA will remain in effect (meaning this test can be used) for the duration of the COVID-19 declaration under Section 564(b)(1) of the Act, 21 U.S.C. section 360bbb-3(b)(1), unless the authorization is terminated or revoked.     Resp Syncytial Virus by PCR NEGATIVE NEGATIVE Final    Comment: (NOTE) Fact Sheet for Patients: BloggerCourse.com  Fact Sheet for Healthcare Providers: SeriousBroker.it  This test is not yet approved or cleared by the [place]  FDA and has been authorized for detection and/or diagnosis of SARS-CoV-2 by FDA under an  Emergency Use Authorization (EUA). This EUA will remain in effect (meaning this test can be used) for the duration of the COVID-19 declaration under Section 564(b)(1) of the Act, 21 U.S.C. section 360bbb-3(b)(1), unless the authorization is terminated or revoked.  Performed at [facility], [address], [place], [place] [address]      Radiological Exams on Admission:   Assessment/Plan Principal Problem:   HCAP (healthcare-associated pneumonia) Active Problems:   Aspiration pneumonia (HCC)   CAD (coronary artery disease)   Myocardial injury   ESRD on dialysis [facility])   Atrial  fibrillation, chronic (HCC)   Type 2 diabetes mellitus with ESRD (end-stage renal disease) (HCC)   Hyperlipidemia associated with type 2 diabetes mellitus (HCC)   Chronic diastolic CHF (congestive heart failure) (HCC)   Chronic pain syndrome   Pancytopenia (HCC)   Anemia in ESRD (end-stage renal disease) (HCC)   Overweight (BMI 25.0-29.9)   Assessment and Plan:  HCAP (healthcare-associated pneumonia) vs. aspiration pneumonia [facility]): Patient has 2 L new oxygen requirement.  Does not meet criteria for sepsis.       CAD (coronary artery disease)   Myocardial injury   ESRD on dialysis [facility])   Atrial fibrillation, chronic (HCC)   Type 2 diabetes mellitus with ESRD (end-stage renal disease) (HCC)   Hyperlipidemia associated with type 2 diabetes mellitus (HCC)   Chronic diastolic CHF (congestive heart failure) (HCC)   Chronic pain syndrome   Pancytopenia (HCC)   Anemia in ESRD (end-stage renal disease) (HCC)   Overweight (BMI 25.0-29.9)      DVT ppx: on Eliquis   Code Status: Full code   Family Communication:     not done, no family member is at bed side.     Disposition Plan:  Anticipate discharge back to previous environment  Consults called: Dr. Lamount Pimple of renal  Admission status and Level of care: Telemetry Medical:   as inpt        Dispo: The patient is from: Home               Anticipated d/c is to: Home              Anticipated d/c date is: 2 days              Patient currently is not medically stable to d/c.    Severity of Illness:  The appropriate patient status for this patient is INPATIENT. Inpatient status is judged to be reasonable and necessary in order to provide the required intensity of service to ensure the patient's safety. The patient's presenting symptoms, physical exam findings, and initial radiographic and laboratory data in the context of their chronic comorbidities is felt to place them at high risk for further clinical deterioration. Furthermore, it is not anticipated that the patient will be medically stable for discharge from the hospital within 2 midnights of admission.   * I certify that at the point of admission it is my clinical judgment that the patient will require inpatient hospital care spanning beyond 2 midnights from the point of admission due to high intensity of service, high risk for further deterioration and high frequency of surveillance required.*       Date of Service 08/29/2023    Adam Howell [facility]   If 7PM-7AM, please contact night-coverage [email or web address] 08/29/2023, 11:53 PM'

## 2023-08-29 NOTE — ED Provider Notes (Signed)
 [facility] Provider Note    Event Date/Time   First MD Initiated Contact with Patient 08/29/23 1947     (approximate)   History   Shortness of Breath   HPI  Adam Howell is a 63 year old male with history of ESRD, CHF, HTN presenting to the emergency department for evaluation of shortness of breath.  Patient reports onset of cough, congestion today.  Does not feel fluid overloaded.  Completed his dialysis session on Friday.  No reported fevers.  Does report absence of taste.  No chest pain.  Hypoxic for EMS, placed on 2 L.  I reviewed patient's recent prolonged admission.  At that time he presented for an elective AV fistula revision.  During admission he had hypotension with PermCath placement with concerns for bleeding as well as possible IV contrast reaction.  He required intubation and vasopressor support, extubated on 5/26.  Was discharged with plans for dialysis using right groin permacatheter.       Physical Exam   Triage Vital Signs: ED Triage Vitals [08/29/23 1958]  Encounter Vitals Group     BP (!) 99/54     Girls Systolic BP Percentile      Girls Diastolic BP Percentile      Boys Systolic BP Percentile      Boys Diastolic BP Percentile      Pulse Rate 61     Resp 20     Temp 98.9 F (37.2 C)     Temp Source Oral     SpO2 98 %     Weight 235 lb (106.6 kg)     Height 6' 3 (1.905 m)     Head Circumference      Peak Flow      Pain Score 0     Pain Loc      Pain Education      Exclude from Growth Chart     Most recent vital signs: Vitals:   08/29/23 1958 08/29/23 2030  BP: (!) 99/54 98/61  Pulse: 61 (!) 59  Resp: 20 (!) 24  Temp: 98.9 F (37.2 C)   SpO2: 98% 100%     General: Awake, interactive  CV:  Regular rate, good peripheral perfusion.  Resp:  Mildly labored respirations, lungs clear to auscultation Abd:  Nondistended.  Neuro:  Symmetric facial movement, fluid speech MSK:  Left distal upper extremity  fistula without appreciable thrill   ED Results / Procedures / Treatments   Labs (all labs ordered are listed, but only abnormal results are displayed) Labs Reviewed  CBC WITH DIFFERENTIAL/PLATELET - Abnormal; Notable for the following components:      Result Value   WBC 3.8 (*)    RBC 2.65 (*)    Hemoglobin 7.4 (*)    HCT 25.1 (*)    MCHC 29.5 (*)    RDW 19.6 (*)    Platelets 95 (*)    All other components within normal limits  COMPREHENSIVE METABOLIC PANEL WITH GFR - Abnormal; Notable for the following components:   BUN 41 (*)    Creatinine, Ser 9.88 (*)    Calcium  8.3 (*)    Albumin  2.7 (*)    GFR, Estimated 5 (*)    All other components within normal limits  TROPONIN I (HIGH SENSITIVITY) - Abnormal; Notable for the following components:   Troponin I (High Sensitivity) 107 (*)    All other components within normal limits  RESP PANEL BY RT-PCR (RSV, FLU A&B, COVID)  [id number]  CULTURE, BLOOD (ROUTINE X 2)  CULTURE, BLOOD (ROUTINE X 2)  BASIC METABOLIC PANEL WITH GFR  CBC  TROPONIN I (HIGH SENSITIVITY)     EKG EKG independently reviewed and interpreted by myself demonstrates:  EKG demonstrates A-fib at a rate of 57, QRS 150, QTc 524, no acute ST changes, prior history of A-fib  RADIOLOGY Imaging independently reviewed and interpreted by myself demonstrates:  CT chest demonstrates bilateral groundglass airspace disease concerning for bilateral pneumonia  Formal Radiology Read:  CT Chest Wo Contrast Result Date: 08/29/2023 CLINICAL DATA:  Respiratory illness, nondiagnostic xray recent hemothorax, new respiratory symptoms, short of breath, recent history of sepsis EXAM: CT CHEST WITHOUT CONTRAST TECHNIQUE: Multidetector CT imaging of the chest was performed following the standard protocol without IV contrast. RADIATION DOSE REDUCTION: This exam was performed according to the departmental dose-optimization program which includes automated exposure control, adjustment of the  mA and/or kV according to patient size and/or use of iterative reconstruction technique. COMPARISON:  08/11/2023, 08/05/2023 FINDINGS: Cardiovascular: Unenhanced imaging of the heart demonstrates stable cardiomegaly without pericardial effusion. Normal caliber of the thoracic aorta. Atherosclerosis of the aorta and coronary vasculature. Mediastinum/Nodes: Borderline enlarged mediastinal lymph nodes, largest measuring 10 mm in the right paratracheal region. Thyroid , trachea, and esophagus are unremarkable. Lungs/Pleura: Scattered nodular ground-glass airspace disease is seen bilaterally, greatest within the lower lobes. Findings are consistent with infection, aspiration, or hemorrhage. No effusion or pneumothorax. The central airways are patent. Upper Abdomen: Central venous catheter from an inferior approach, tip at the atriocaval junction. No acute upper abdominal findings. Musculoskeletal: No acute or destructive bony abnormalities. Reconstructed images demonstrate no additional findings. IMPRESSION: 1. Scattered bilateral nodular ground-glass airspace disease, greatest in the lower lobes, favor bilateral pneumonia given clinical presentation. 2. Cardiomegaly. 3. Borderline mediastinal adenopathy, likely reactive. 4. Aortic Atherosclerosis (ICD10-I70.0). Coronary artery atherosclerosis. Electronically Signed   By: Bobbye Burrow M.D.   On: 08/29/2023 20:38    PROCEDURES:  Critical Care performed: No  Procedures   MEDICATIONS ORDERED IN ED: Medications  vancomycin  (VANCOCIN ) IVPB 1000 mg/200 mL premix (has no administration in time range)  ceFEPIme  (MAXIPIME ) 2 g in sodium chloride  0.9 % 100 mL IVPB (has no administration in time range)  azithromycin (ZITHROMAX) 500 mg in sodium chloride  0.9 % 250 mL IVPB (has no administration in time range)  dextromethorphan-guaiFENesin  (MUCINEX  DM) 30-600 MG per 12 hr tablet 1 tablet (has no administration in time range)  diphenhydrAMINE  (BENADRYL ) injection 12.5  mg (has no administration in time range)  acetaminophen  (TYLENOL ) tablet 650 mg (has no administration in time range)  albuterol  (PROVENTIL ) (2.5 MG/3ML) 0.083% nebulizer solution 2.5 mg (has no administration in time range)     IMPRESSION / MDM / ASSESSMENT AND PLAN / ED COURSE  I reviewed the triage vital signs and the nursing notes.  Differential diagnosis includes, but is not limited to, viral illness, pneumonia, pneumothorax, worsening hemothorax, volume overload  Patient's presentation is most consistent with acute presentation with potential threat to life or bodily function.  63 year old male presenting to the emergency department for evaluation of shortness of breath.  Low normal blood pressure on presentation, arrives on 2 L O2 given prehospital hypoxia.  Given recent complicated hospitalization including hemothorax, will obtain CT to further evaluate.  Labs with mild leukopenia with WC of 3.8, anemia with hemoglobin of 7.4, similar to slightly lower than prior.  CMP with elevated BUN and creatinine consistent with known history of ESRD, but reassuring potassium of 4.4.  Elevated troponin at 107, history of chronic elevated troponin, though this is increased from prior, possibly related to strain in the setting of acute illness. CT of the chest demonstrates scattered airspace disease worse in the lower lobes concerning for bilateral pneumonia.  Patient reassessed.  Satting 92 to 93% on 2 L.  Updated on results of workup.  with his new hypoxia, do think he is appropriate for admission.  Ordered for broad-spectrum antibiotics.  Will reach out to hospitalist team.  Case discussed with Dr. Rosalea Collin.  He will evaluate for anticipated admission.          FINAL CLINICAL IMPRESSION(S) / ED DIAGNOSES   Final diagnoses:  Pneumonia of both lungs due to infectious organism, unspecified part of lung  Acute hypoxic respiratory failure (HCC)     Rx / DC Orders   ED Discharge Orders      None        Note:  This document was prepared using Dragon voice recognition software and may include unintentional dictation errors.   Claria Crofts, MD 08/29/23 2240

## 2023-08-29 NOTE — ED Notes (Signed)
 Requested IV team consult due to pt difficult to access. Needs 2 PIV and 2 sets blood cultures plus 2nd troponin.

## 2023-08-29 NOTE — H&P (Signed)
 History and Physical    Adam Howell AOZ:[id number] DOB: 06-16-1960 DOA: 08/29/2023  Referring MD/NP/PA:   PCP: Lemar Pyles, NP   Patient coming from:  The patient is coming from home.     Chief Complaint: SOB  HPI: Adam Howell is a 63 y.o. male with medical history significant of ESRD-HD (MWF), HTN, HLD, DM, CAD, dCHF, A fib on Eliquis , amenia, chronic pain, s/p of right AKA, who presents with SOB.  Patient recently had complicated admission from 5/22 - 5/29.  At that time he presented for an elective AV fistula revision.  During admission he had hypotension with PermCath placement which was concerning for bleeding and possible IV contrast reaction, required intubation and vasopressor support, extubated on 5/26.  Pt was discharged with plans for dialysis using right groin permacatheter.    Patient states that he has dry cough congestion and SOB in the past 3 days, which has been progressively worsening.  No chest pain, fever or chills.  No nausea, vomiting, diarrhea or abdominal pain.  No symptoms of UTI.  Denies dark stool or rectal bleeding.  Patient had dialysis on Friday.   Data reviewed independently and ED Course: pt was found to have pancytopenia with WBC 3.8, hemoglobin 7.4 and platelet 95 (patient had WBC 10.0, hemoglobin 8.0 and platelet 215 on 08/16/2023), negative PCR for COVID, flu and RSV, troponin 107, potassium 4.4, bicarbonate 25, creatinine 9.88, BUN 41.  Temperature normal, blood pressure 98/61, heart rate 59, RR 24, oxygen saturation 95% on room air, which improved to 100% on 2 L oxygen.  Patient is admitted to telemetry bed as inpatient.  CT of chest: 1. Scattered bilateral nodular ground-glass airspace disease, greatest in the lower lobes, favor bilateral pneumonia given clinical presentation. 2. Cardiomegaly. 3. Borderline mediastinal adenopathy, likely reactive. 4. Aortic Atherosclerosis (ICD10-I70.0). Coronary artery atherosclerosis.    EKG:  I have personally reviewed.  A-fib, QTc 524, low voltage, poor R wave progression, heart rate 57   Review of Systems:   General: no fevers, chills, no body weight gain, has fatigue HEENT: no blurry vision, hearing changes or sore throat Respiratory: has dyspnea, coughing, no wheezing CV: no chest pain, no palpitations GI: no nausea, vomiting, abdominal pain, diarrhea, constipation GU: no dysuria, burning on urination, increased urinary frequency, hematuria  Ext: has trace leg edema Neuro: no unilateral weakness, numbness, or tingling, no vision change or hearing loss Skin: no rash, no skin tear. MSK: No muscle spasm, no deformity, no limitation of range of movement in spin Heme: No easy bruising.  Travel history: No recent long distant travel.   Allergy:  Allergies  Allergen Reactions   Ivp Dye [Iodinated Contrast Media] Anaphylaxis   Tramadol  Rash    Past Medical History:  Diagnosis Date   Anemia in chronic kidney disease    Cellulitis of left lower extremity    Chronic atrial fibrillation (HCC)    Chronic kidney disease    Congestive heart failure (HCC)    Difficult intubation    End-stage renal disease on hemodialysis (HCC)    Hyperlipidemia    Hypertension    Type 2 diabetes mellitus with complication [facility])     Past Surgical History:  Procedure Laterality Date   A/V FISTULAGRAM Left 08/27/2022   Procedure: A/V Fistulagram;  Surgeon: Celso College, MD;  Location: [facility] INVASIVE CV LAB;  Service: Cardiovascular;  Laterality: Left;   A/V FISTULAGRAM Left 08/12/2023   Procedure: A/V Fistulagram;  Surgeon: Celso College, MD;  Location: [facility] INV[facility] CV [facility];  Service: Cardiovascular;  Laterality: Left;   AMPUTATION Right 03/13/2022   Procedure: AMPUTATION ABOVE KNEE;  Surgeon: Celso College, MD;  Location: [facility] ORS;  Service: General;  Laterality: Right;   APPLICATION OF WOUND VAC Right 03/07/2022   Procedure: APPLICATION OF WOUND VAC;  Surgeon: Lesta Rater, MD;   Location: [facility] ORS;  Service: Vascular;  Laterality: Right;  [place] [id number]   CENTRAL LINE INSERTION Left 08/05/2023   Procedure: CENTRAL LINE INSERTION;  Surgeon: Celso College, MD;  Location: [facility] ORS;  Service: Vascular;  Laterality: Left;   DIALYSIS/PERMA CATHETER INSERTION Right 08/05/2023   Procedure: DIALYSIS/PERMA CATHETER INSERTION;  Surgeon: Celso College, MD;  Location: [facility] ORS;  Service: Vascular;  Laterality: Right;   GROIN DISSECTION Right 08/05/2023   Procedure: right groin venogram, angioplasty of superior venacava and anomic vein;  Surgeon: Celso College, MD;  Location: [facility] ORS;  Service: Vascular;  Laterality: Right;   LOWER EXTREMITY ANGIOGRAPHY Left 02/15/2023   Procedure: Lower Extremity Angiography;  Surgeon: Celso College, MD;  Location: [facility];  Service: Cardiovascular;  Laterality: Left;   REVISON OF ARTERIOVENOUS FISTULA Left 08/05/2023   Procedure: REVISON OF ARTERIOVENOUS FISTULA;  Surgeon: Celso College, MD;  Location: [facility] ORS;  Service: Vascular;  Laterality: Left;  JUMP GRAFT   WOUND DEBRIDEMENT Left    WOUND DEBRIDEMENT Right 01/16/2022   Procedure: DEBRIDEMENT WOUND;  Surgeon: Jackquelyn Mass, MD;  Location: [facility] ORS;  Service: Vascular;  Laterality: Right;  Wound vac placement   WOUND DEBRIDEMENT Right 03/07/2022   Procedure: DEBRIDEMENT WOUND RIGHT LOWER EXTREMITY;  Surgeon: Lesta Rater, MD;  Location: [facility] ORS;  Service: Vascular;  Laterality: Right;    Social History:  reports that he has never smoked. He has never used smokeless tobacco. He reports that he does not currently use alcohol. He reports that he does not use drugs.  Family History:  Family History  Problem Relation Age of Onset   Heart failure Mother    Cirrhosis Father    Hypertension Daughter      Prior to Admission medications   Medication Sig Start Date End Date Taking? Authorizing Provider  acetaminophen  (TYLENOL ) 325 MG tablet Take 2 tablets (650 mg total) by mouth every 6  (six) hours as needed for mild pain (pain score 1-3), fever or headache (or Fever >/= 101). 03/04/23   Althia Atlas, MD  amiodarone  (PACERONE ) 200 MG tablet Take 1 tablet (200 mg total) by mouth 2 (two) times daily. 10/12/22   Cannady, Jolene T, NP  aspirin  EC 81 MG tablet Take 1 tablet (81 mg total) by mouth daily. Swallow whole. 03/05/23   Althia Atlas, MD  carvedilol  (COREG ) 6.25 MG tablet Take 6.25 mg by mouth daily. 06/25/23   [provider]  ELIQUIS  2.5 MG TABS tablet Take 1 tablet (2.5 mg total) by mouth 2 (two) times daily. 10/12/22   Cannady, Jolene T, NP  iron  polysaccharides (NIFEREX) 150 MG capsule Take 1 capsule (150 mg total) by mouth daily. 03/05/23   Althia Atlas, MD  melatonin 3 MG TABS tablet Take 3 mg by mouth at bedtime.    [provider]  midodrine  (PROAMATINE ) 5 MG tablet Take 5 mg by mouth as needed (post dialysis hypotension SBP<90 or DBP<55).    [provider]  ondansetron  (ZOFRAN ) 4 MG tablet Take 4 mg by mouth every 6 (six) hours as needed for nausea or vomiting. 08/01/23  [provider]  oxyCODONE -acetaminophen  (PERCOCET/ROXICET) 5-325 MG tablet Take 1-2 tablets by mouth every 4 (four) hours as needed for moderate pain (pain score 4-6). 08/16/23   Pace, Valentin Gaskins, NP  rosuvastatin  (CRESTOR ) 40 MG tablet Take 1 tablet (40 mg total) by mouth daily. 10/12/22   Cannady, Jolene T, NP  SANTYL  250 UNIT/GM ointment Apply 1 Application topically daily. 02/08/23   [provider]  senna (SENOKOT) 8.6 MG TABS tablet Take 2 tablets (17.2 mg total) by mouth at bedtime. 04/03/22   Love, Renay Carota, PA-C  sevelamer  carbonate (RENVELA ) 800 MG tablet Take 2,400 mg by mouth 3 (three) times daily. 09/21/22   [provider]  traZODone  (DESYREL ) 100 MG tablet Take 1 tablet (100 mg total) by mouth at bedtime. 01/12/23   Doran Galloway T, NP    Physical Exam: Vitals:   08/29/23 1958 08/29/23 2030 08/29/23 2300  BP: (!) 99/54 98/61 (!) 100/59   Pulse: 61 (!) 59   Resp: 20 (!) 24 19  Temp: 98.9 F (37.2 C)    TempSrc: Oral    SpO2: 98% 100% 98%  Weight: 106.6 kg    Height: 6' 3 (1.905 m)     General: Not in acute distress HEENT:       Eyes: PERRL, EOMI, no jaundice       ENT: No discharge from the ears and nose, no pharynx injection, no tonsillar enlargement.        Neck: No JVD, no bruit, no mass felt. Heme: No neck lymph node enlargement. Cardiac: S1/S2, RRR, No murmurs, No gallops or rubs. Respiratory: Has coarse breathing sound bilaterally GI: Soft, nondistended, nontender, no rebound pain, no organomegaly, BS present. GU: No hematuria Ext: has trace leg edema bilaterally. 1+DP/PT pulse bilaterally. Musculoskeletal: No joint deformities, No joint redness or warmth, no limitation of ROM in spin. Skin: No rashes.  Neuro: Alert, oriented X3, cranial nerves II-XII grossly intact, moves all extremities normally.   Psych: Patient is not psychotic, no suicidal or hemocidal ideation.  Labs on Admission: I have personally reviewed following labs and imaging studies  CBC: Recent Labs  Lab 08/29/23 1958  WBC 3.8*  NEUTROABS 2.4  HGB 7.4*  HCT 25.1*  MCV 94.7  PLT 95*   Basic Metabolic Panel: Recent Labs  Lab 08/29/23 1958  NA 138  K 4.4  CL 98  CO2 25  GLUCOSE 97  BUN 41*  CREATININE 9.88*  CALCIUM  8.3*   GFR: Estimated Creatinine Clearance: 10.2 mL/min (A) (by C-G formula based on SCr of 9.88 mg/dL (H)). Liver Function Tests: Recent Labs  Lab 08/29/23 1958  AST 22  ALT 13  ALKPHOS 69  BILITOT 0.9  PROT 6.7  ALBUMIN  2.7*   No results for input(s): LIPASE, AMYLASE in the last 168 hours. No results for input(s): AMMONIA in the last 168 hours. Coagulation Profile: No results for input(s): INR, PROTIME in the last 168 hours. Cardiac Enzymes: No results for input(s): CKTOTAL, CKMB, CKMBINDEX, TROPONINI in the last 168 hours. BNP (last 3 results) No results for input(s):  PROBNP in the last 8760 hours. HbA1C: No results for input(s): HGBA1C in the last 72 hours. CBG: No results for input(s): GLUCAP in the last 168 hours. Lipid Profile: No results for input(s): CHOL, HDL, LDLCALC, TRIG, CHOLHDL, LDLDIRECT in the last 72 hours. Thyroid  Function Tests: No results for input(s): TSH, T4TOTAL, FREET4, T3FREE, THYROIDAB in the last 72 hours. Anemia Panel: No results for input(s): VITAMINB12, FOLATE, FERRITIN, TIBC,  IRON , RETICCTPCT in the last 72 hours. Urine analysis: No results found for: COLORURINE, APPEARANCEUR, LABSPEC, PHURINE, GLUCOSEU, HGBUR, BILIRUBINUR, KETONESUR, PROTEINUR, UROBILINOGEN, NITRITE, LEUKOCYTESUR Sepsis Labs: @LABRCNTIP (procalcitonin:4,lacticidven:4) ) Recent Results (from the past 240 hours)  Resp panel by RT-PCR (RSV, Flu A&B, Covid) Anterior Nasal Swab     Status: None   Collection Time: 08/29/23  8:00 PM   Specimen: Anterior Nasal Swab  Result Value Ref Range Status   SARS Coronavirus 2 by RT PCR NEGATIVE NEGATIVE Final    Comment: (NOTE) SARS-CoV-2 target nucleic acids are NOT DETECTED.  The SARS-CoV-2 RNA is generally detectable in upper respiratory specimens during the acute phase of infection. The lowest concentration of SARS-CoV-2 viral copies this assay can detect is 138 copies/mL. A negative result does not preclude SARS-Cov-2 infection and should not be used as the sole basis for treatment or other patient management decisions. A negative result may occur with  improper specimen collection/handling, submission of specimen other than nasopharyngeal swab, presence of viral mutation(s) within the areas targeted by this assay, and inadequate number of viral copies(<138 copies/mL). A negative result must be combined with clinical observations, patient history, and epidemiological information. The expected result is Negative.  Fact Sheet for Patients:   BloggerCourse.com  Fact Sheet for Healthcare Providers:  SeriousBroker.it  This test is no t yet approved or cleared by the [place]  FDA and  has been authorized for detection and/or diagnosis of SARS-CoV-2 by FDA under an Emergency Use Authorization (EUA). This EUA will remain  in effect (meaning this test can be used) for the duration of the COVID-19 declaration under Section 564(b)(1) of the Act, 21 U.S.C.section 360bbb-3(b)(1), unless the authorization is terminated  or revoked sooner.       Influenza A by PCR NEGATIVE NEGATIVE Final   Influenza B by PCR NEGATIVE NEGATIVE Final    Comment: (NOTE) The Xpert Xpress SARS-CoV-2/FLU/RSV plus assay is intended as an aid in the diagnosis of influenza from Nasopharyngeal swab specimens and should not be used as a sole basis for treatment. Nasal washings and aspirates are unacceptable for Xpert Xpress SARS-CoV-2/FLU/RSV testing.  Fact Sheet for Patients: BloggerCourse.com  Fact Sheet for Healthcare Providers: SeriousBroker.it  This test is not yet approved or cleared by the [place]  FDA and has been authorized for detection and/or diagnosis of SARS-CoV-2 by FDA under an Emergency Use Authorization (EUA). This EUA will remain in effect (meaning this test can be used) for the duration of the COVID-19 declaration under Section 564(b)(1) of the Act, 21 U.S.C. section 360bbb-3(b)(1), unless the authorization is terminated or revoked.     Resp Syncytial Virus by PCR NEGATIVE NEGATIVE Final    Comment: (NOTE) Fact Sheet for Patients: BloggerCourse.com  Fact Sheet for Healthcare Providers: SeriousBroker.it  This test is not yet approved or cleared by the [place]  FDA and has been authorized for detection and/or diagnosis of SARS-CoV-2 by FDA under an Emergency Use  Authorization (EUA). This EUA will remain in effect (meaning this test can be used) for the duration of the COVID-19 declaration under Section 564(b)(1) of the Act, 21 U.S.C. section 360bbb-3(b)(1), unless the authorization is terminated or revoked.  Performed at [facility], [address], [place], [place] [address]      Radiological Exams on Admission:   Assessment/Plan Principal Problem:   HCAP (healthcare-associated pneumonia) Active Problems:   Aspiration pneumonia (HCC)   CAD (coronary artery disease)   Myocardial injury   ESRD on dialysis [facility])   Atrial  fibrillation, chronic (HCC)   Type 2 diabetes mellitus with ESRD (end-stage renal disease) (HCC)   Hyperlipidemia associated with type 2 diabetes mellitus (HCC)   Chronic diastolic CHF (congestive heart failure) (HCC)   Chronic pain syndrome   Pancytopenia (HCC)   Anemia in ESRD (end-stage renal disease) (HCC)   Overweight (BMI 25.0-29.9)   Assessment and Plan:  HCAP (healthcare-associated pneumonia) vs. aspiration pneumonia [facility]): Patient has 2 L new oxygen requirement.  Does not meet criteria for sepsis.   - Will admit to tele bed   as inpt - IV Vancomycin  and Zosyn  (patient received 1 dose of cefepime  and azithromycin in ED) - Incentive spirometry - Mucinex  for cough  - Bronchodilators - Urine legionella and S. pneumococcal antigen - Follow up blood culture x2, sputum culture  CAD (coronary artery disease) and myocardial injury: trop  107, no chest pain.  Likely due to demand ischemia versus decreased clearance of troponin in the setting of ESRD. -Trend troponin - Continue aspirin , Crestor   ESRD on dialysis (MWF): Potassium normal 4.4, no signs of fluid overload. - Consulted Dr. Lamount Pimple of renal for dialysis  Atrial fibrillation, chronic [facility]): currently heart rate 59 -Continue amiodarone , Coreg  - Eliquis   Diet controlled type 2 diabetes mellitus with ESRD (end-stage renal disease) (HCC):  Recent A1c 6.0, well-controlled.  Patient is not taking medications currently.  Blood sugar 97. -No treatment needed  Hyperlipidemia associated with type 2 diabetes mellitus (HCC) - Crestor   Chronic diastolic CHF (congestive heart failure) (HCC): Has trace leg edema, does not have signs of fluid overload.  2D echo on 08/09/2023 showed EF of 55 to 60%. -Volume management per renal by dialysis  Chronic pain syndrome -Continue home as needed Percocet  Pancytopenia and Anemia in ESRD (end-stage renal disease) (HCC): hemoglobin slightly dropped from 8.0 on 08/16/2023 --> 7.4.  Denies dark stool or rectal bleeding. - Continue iron  supplement  Overweight (BMI 25.0-29.9): Body weight 106.6 kg, BMI 29.37 - Encourage losing weight - Exercise healthy diet       DVT ppx: on Eliquis   Code Status: Full code   Family Communication:     not done, no family member is at bed side.     Disposition Plan:  Anticipate discharge back to previous environment  Consults called: Dr. Lamount Pimple of renal  Admission status and Level of care: Telemetry Medical:   as inpt        Dispo: The patient is from: Home              Anticipated d/c is to: Home              Anticipated d/c date is: 2 days              Patient currently is not medically stable to d/c.    Severity of Illness:  The appropriate patient status for this patient is INPATIENT. Inpatient status is judged to be reasonable and necessary in order to provide the required intensity of service to ensure the patient's safety. The patient's presenting symptoms, physical exam findings, and initial radiographic and laboratory data in the context of their chronic comorbidities is felt to place them at high risk for further clinical deterioration. Furthermore, it is not anticipated that the patient will be medically stable for discharge from the hospital within 2 midnights of admission.   * I certify that at the point of admission it is my clinical  judgment that the patient will require inpatient hospital care spanning beyond 2  midnights from the point of admission due to high intensity of service, high risk for further deterioration and high frequency of surveillance required.*       Date of Service 08/29/2023    Fidencio Hue [facility]   If 7PM-7AM, please contact night-coverage [email or web address] 08/29/2023, 11:53 PM'

## 2023-08-29 NOTE — ED Notes (Signed)
 CCMD called to initiate cardiac monitoring

## 2023-08-29 NOTE — ED Notes (Signed)
 Date and time results received: 08/29/23 9:22 PM  Test: Troponin Critical Value: 107  Name of Provider Notified: N. Ray  Orders Received? Or Actions Taken?: Awaiting orders

## 2023-08-29 NOTE — Progress Notes (Signed)
 Pharmacy Antibiotic Note  Adam Howell is a 63 y.o. male w/ ESRD on HD admitted on 08/29/2023 with HCAP.  Pharmacy has been consulted for Vancomycin  dosing.  Plan: Pt given Vancomycin  2500 mg once. Vancomycin  1000 mg IV Q MWF following dialysis. Goal AUC 400-550.  Pharmacy will continue to follow and will adjust abx dosing whenever warranted.  Temp (24hrs), Avg:98.9 F (37.2 C), Min:98.9 F (37.2 C), Max:98.9 F (37.2 C)   Recent Labs  Lab 08/29/23 1958  WBC 3.8*  CREATININE 9.88*    Estimated Creatinine Clearance: 10.2 mL/min (A) (by C-G formula based on SCr of 9.88 mg/dL (H)).    Allergies  Allergen Reactions   Ivp Dye [Iodinated Contrast Media] Anaphylaxis   Tramadol  Rash    Antimicrobials this admission: 6/16 Zosyn  >>  6/16 Vancomycin  >>   Microbiology results: 6/16 BCx: Pending 6/16 Sputum: Sent   Thank you for allowing pharmacy to be a part of this patient's care.  Coretta Dexter, PharmD, [facility] 08/29/2023 11:58 PM

## 2023-08-30 LAB — BLOOD CULTURE ID PANEL (REFLEXED) - BCID2

## 2023-08-30 LAB — CBC
HCT: 23.7 % — ABNORMAL LOW (ref 39.0–52.0)
Hemoglobin: 7 g/dL — ABNORMAL LOW (ref 13.0–17.0)
MCH: 27.9 pg (ref 26.0–34.0)
MCHC: 29.5 g/dL — ABNORMAL LOW (ref 30.0–36.0)
MCV: 94.4 fL (ref 80.0–100.0)
Platelets: 99 10*3/uL — ABNORMAL LOW (ref 150–400)
RBC: 2.51 MIL/uL — ABNORMAL LOW (ref 4.22–5.81)
RDW: 19.5 % — ABNORMAL HIGH (ref 11.5–15.5)
WBC: 4.3 10*3/uL (ref 4.0–10.5)
nRBC: 0 % (ref 0.0–0.2)

## 2023-08-30 LAB — BASIC METABOLIC PANEL WITH GFR
Anion gap: 13 (ref 5–15)
BUN: 47 mg/dL — ABNORMAL HIGH (ref 8–23)
CO2: 31 mmol/L (ref 22–32)
Calcium: 8.1 mg/dL — ABNORMAL LOW (ref 8.9–10.3)
Chloride: 94 mmol/L — ABNORMAL LOW (ref 98–111)
Creatinine, Ser: 10.35 mg/dL — ABNORMAL HIGH (ref 0.61–1.24)
GFR, Estimated: 5 mL/min — ABNORMAL LOW (ref >60)
Glucose, Bld: 94 mg/dL (ref 70–99)
Potassium: 4.7 mmol/L (ref 3.5–5.1)
Sodium: 138 mmol/L (ref 135–145)

## 2023-08-30 LAB — HEPATITIS B SURFACE ANTIGEN: Hepatitis B Surface Ag: NONREACTIVE

## 2023-08-30 LAB — TROPONIN I (HIGH SENSITIVITY): Troponin I (High Sensitivity): 98 ng/L — ABNORMAL HIGH (ref <18)

## 2023-08-30 LAB — PREPARE RBC (CROSSMATCH)

## 2023-08-30 LAB — HEMOGLOBIN AND HEMATOCRIT, BLOOD
HCT: 28.9 % — ABNORMAL LOW (ref 39.0–52.0)
Hemoglobin: 8.8 g/dL — ABNORMAL LOW (ref 13.0–17.0)

## 2023-08-30 LAB — MRSA NEXT GEN BY PCR, NASAL: MRSA by PCR Next Gen: DETECTED — AB

## 2023-08-30 MED ORDER — EPOETIN ALFA-EPBX 10000 UNIT/ML IJ SOLN
10000.0000 [IU] | INTRAMUSCULAR | Status: DC
  Administered 2023-09-01: 10000 [IU] via INTRAVENOUS
  Filled 2023-08-30 (×2): qty 2

## 2023-08-30 MED ORDER — MEDIHONEY WOUND/BURN DRESSING EX PSTE
1.0000 | PASTE | Freq: Every day | CUTANEOUS | Status: DC
  Administered 2023-08-31 – 2023-09-07 (×8): 1 via TOPICAL
  Filled 2023-08-30 (×2): qty 44

## 2023-08-30 MED ORDER — MIDODRINE HCL 5 MG PO TABS
10.0000 mg | ORAL_TABLET | Freq: Once | ORAL | Status: AC
  Administered 2023-08-30: 10 mg via ORAL
  Filled 2023-08-30: qty 2

## 2023-08-30 MED ORDER — SODIUM CHLORIDE 0.9 % IV SOLN: Freq: Once | INTRAVENOUS | Status: DC

## 2023-08-30 MED ORDER — VANCOMYCIN HCL IN DEXTROSE 1-5 GM/200ML-% IV SOLN
1000.0000 mg | INTRAVENOUS | Status: DC
  Administered 2023-08-30: 1000 mg via INTRAVENOUS
  Filled 2023-08-30 (×2): qty 200

## 2023-08-30 MED ORDER — SODIUM CHLORIDE 0.9 % IV BOLUS
500.0000 mL | Freq: Once | INTRAVENOUS | Status: AC
  Administered 2023-08-30: 500 mL via INTRAVENOUS

## 2023-08-30 MED ORDER — CHLORHEXIDINE GLUCONATE CLOTH 2 % EX PADS
6.0000 | MEDICATED_PAD | Freq: Every day | CUTANEOUS | Status: DC
  Administered 2023-08-31 – 2023-09-05 (×6): 6 via TOPICAL
  Filled 2023-08-30: qty 6

## 2023-08-30 MED ORDER — PIPERACILLIN-TAZOBACTAM IN DEX 2-0.25 GM/50ML IV SOLN
2.2500 g | Freq: Three times a day (TID) | INTRAVENOUS | Status: DC
  Administered 2023-08-30 – 2023-09-01 (×7): 2.25 g via INTRAVENOUS
  Filled 2023-08-30 (×9): qty 50

## 2023-08-30 MED ORDER — SODIUM CHLORIDE 0.9% IV SOLUTION
Freq: Once | INTRAVENOUS | Status: AC
  Administered 2023-09-03: 50 mL via INTRAVENOUS

## 2023-08-30 MED ORDER — VANCOMYCIN HCL 1250 MG/250ML IV SOLN
1250.0000 mg | Freq: Once | INTRAVENOUS | Status: AC
  Administered 2023-08-30: 1250 mg via INTRAVENOUS
  Filled 2023-08-30: qty 250

## 2023-08-30 NOTE — Consult Note (Signed)
 WOC Nurse Consult Note: Reason for Consult: wounds Wound type: 1.  Full thickness L lower arm site of AV fistula  2.  Full thickness L anterior shin ? Etiology  3.  Full thickness L lateral foot ? Diabetic ulcer  Pressure Injury POA: NA not related to pressure  Measurement: see nursing flowsheet  Wound bed: L lower arm with large dark hemorrhagic tissue/eschar; second area of full thickness pink  2  L anterior shin wound 50% pink dry 50% yellow  3.  L lateral foot 50% pink 50% yellow Drainage (amount, consistency, odor) some purulence to anterior shin wound and L AV fistula site per infectious disease note  Periwound: hyperkeratotic skin to L lower leg dark skin surrounding lateral foot wound  Dressing procedure/placement/frequency:  Cleanse L lower arm AV fistula sites with Vashe wound cleanser [employer] [phone]) do not rinse and allow to air dry.  Apply Xeroform gauze ([place] [phone]) to wound beds daily and secure with ABD pad and Kerlix roll gauze.  Cleanse L anterior shin and L lateral foot wounds with Vashe wound cleanser [employer] Foot [phone]) do not rinse and allow to air dry. Apply Medihoney to wound bed daily, fill in wound bed with dry gauze and silicone foam or Kerlix roll gauze whichever is preferred.  POC discussed with bedside nurse. WOC team will not follow. Re-consult if further needs arise.   Thank you,    Ronni Colace MSN, RN-BC, [employer]

## 2023-08-30 NOTE — Progress Notes (Signed)
 PHARMACY - PHYSICIAN COMMUNICATION CRITICAL VALUE ALERT - BLOOD CULTURE IDENTIFICATION (BCID)  Adam Howell is an 63 y.o. male who presented to [facility] on 08/29/2023 with a chief complaint of shortness of breath   Name of physician (or Provider) Contacted: DR Broadus Canes  Current antibiotics: Vancomycin  and Zosyn   Changes to prescribed antibiotics recommended:  Patient is on recommended antibiotics - No changes needed  Results for orders placed or performed during the hospital encounter of 08/29/23  Blood Culture ID Panel (Reflexed) (Collected: 08/30/2023 12:06 AM)  Result Value Ref Range   Enterococcus faecalis NOT DETECTED NOT DETECTED   Enterococcus Faecium NOT DETECTED NOT DETECTED   Listeria monocytogenes NOT DETECTED NOT DETECTED   Staphylococcus species DETECTED (A) NOT DETECTED   Staphylococcus aureus (BCID) DETECTED (A) NOT DETECTED   Staphylococcus epidermidis NOT DETECTED NOT DETECTED   Staphylococcus lugdunensis NOT DETECTED NOT DETECTED   Streptococcus species NOT DETECTED NOT DETECTED   Streptococcus agalactiae NOT DETECTED NOT DETECTED   Streptococcus pneumoniae NOT DETECTED NOT DETECTED   Streptococcus pyogenes NOT DETECTED NOT DETECTED   A.calcoaceticus-baumannii NOT DETECTED NOT DETECTED   Bacteroides fragilis NOT DETECTED NOT DETECTED   Enterobacterales NOT DETECTED NOT DETECTED   Enterobacter cloacae complex NOT DETECTED NOT DETECTED   Escherichia coli NOT DETECTED NOT DETECTED   Klebsiella aerogenes NOT DETECTED NOT DETECTED   Klebsiella oxytoca NOT DETECTED NOT DETECTED   Klebsiella pneumoniae NOT DETECTED NOT DETECTED   Proteus species NOT DETECTED NOT DETECTED   Salmonella species NOT DETECTED NOT DETECTED   Serratia marcescens NOT DETECTED NOT DETECTED   Haemophilus influenzae NOT DETECTED NOT DETECTED   Neisseria meningitidis NOT DETECTED NOT DETECTED   Pseudomonas aeruginosa NOT DETECTED NOT DETECTED   Stenotrophomonas maltophilia NOT DETECTED  NOT DETECTED   Candida albicans NOT DETECTED NOT DETECTED   Candida auris NOT DETECTED NOT DETECTED   Candida glabrata NOT DETECTED NOT DETECTED   Candida krusei NOT DETECTED NOT DETECTED   Candida parapsilosis NOT DETECTED NOT DETECTED   Candida tropicalis NOT DETECTED NOT DETECTED   Cryptococcus neoformans/gattii NOT DETECTED NOT DETECTED   Meth resistant mecA/C and MREJ DETECTED (A) NOT DETECTED    Lakie Mclouth Rodriguez-Guzman PharmD, BCPS 08/30/2023 1:46 PM

## 2023-08-30 NOTE — Progress Notes (Signed)
       CROSS COVER NOTE  NAME: Adam Howell MRN: [id number] DOB : [birth year]    Concern as stated by nurse / staff   This PT is being admitted for pneumonia. Pt has been resting well but BP has been trending downward since 0230 with the last reading being below normal limits at 0400 with BP: 80/47 map 57 then I rechecked it at 0415 and bp was 79/45 map 56. Pt also receives dialysis.      Pertinent findings on chart review:      08/30/2023    4:15 AM 08/30/2023    4:06 AM 08/30/2023    3:00 AM  Vitals with BMI  Systolic 79 80 99  Diastolic 45 47 71  Pulse 58 58 65      Latest Ref Rng & Units 08/30/2023    4:05 AM 08/29/2023    7:58 PM 08/16/2023    5:33 AM  CBC  WBC 4.0 - 10.5 K/uL 4.3  3.8  10.0   Hemoglobin 13.0 - 17.0 g/dL 7.0  7.4  8.0   Hematocrit 39.0 - 52.0 % 23.7  25.1  24.9   Platelets 150 - 400 K/uL 99  95  215      Patient Assessment   Assessment and  Interventions   Assessment:  Hypotension Anemia  Plan: IV fluid bolus Type and cross for possible transfusion in dialysis Serial H/H Midodrine  x1--required pressor during her recent past admission

## 2023-08-30 NOTE — ED Notes (Signed)
 MD @bedside  right now

## 2023-08-30 NOTE — ED Notes (Signed)
 Consents signed by pt for hemodialysis and blood products. Forms placed in pt's box with pt's stickers.

## 2023-08-30 NOTE — Progress Notes (Signed)
 PROGRESS NOTE    Adam Howell  JJK:[id number] DOB: 04-Nov-1960 DOA: 08/29/2023 PCP: Lemar Pyles, NP   Assessment & Plan:   Principal Problem:   HCAP (healthcare-associated pneumonia) Active Problems:   Aspiration pneumonia (HCC)   CAD (coronary artery disease)   Myocardial injury   ESRD on dialysis [facility])   Atrial fibrillation, chronic (HCC)   Type 2 diabetes mellitus with ESRD (end-stage renal disease) (HCC)   Hyperlipidemia associated with type 2 diabetes mellitus (HCC)   Chronic diastolic CHF (congestive heart failure) (HCC)   Chronic pain syndrome   Pancytopenia (HCC)   Anemia in ESRD (end-stage renal disease) (HCC)   Overweight (BMI 25.0-29.9)  Assessment and Plan: HCAP  vs. aspiration pneumonia: Does not meet criteria for sepsis. Continue on IV zosyn , vanco, bronchodilators & encourage incentive spirometry. Legionella, strep ordered but not collected, will message nurse   MRSA bacteremia: source is unclear. Blood cxs growing staph aureus, sens pending. Continue on IV zosyn , vanco. ID consulted   Hx of CAD & myocardial injury: trop  107, no chest pain.  Likely due to demand ischemia versus decreased clearance of troponin in the setting of ESRD. Continue on aspirin , statin    ESRD: on HD MWF. Nephro following and recs    Chronic a. fib: continue on amio, coreg , eliquis     DM2: Recent A1c 6.0, well-controlled.    HLD: continue on statin  Chronic diastolic CHF: volume/fluid management w/ HD.    Chronic pain syndrome: continue on home dose of percocet    Bicytopenia: w/ likely ACD secondary to ESRD. S/p 1 unit of pRBCs transfused. Repeat H&H ordered. WBC is back WNL. Platelets are trending up today   Overweight: BMI 29.3. Would benefit from weight loss       DVT prophylaxis: eliquis  Code Status: full  Family Communication:  Disposition Plan: depends on PT/OT recs.  Level of care: Telemetry Medical  Status is: Inpatient Remains inpatient  appropriate because: severity of illness, requiring IV abxs    Consultants:  ID Nephro   Procedures:   Antimicrobials: zosyn , vanco    Subjective: Pt c/o malaise   Objective: Vitals:   08/30/23 0730 08/30/23 0803 08/30/23 0823 08/30/23 0840  BP: 95/64  108/62 106/70  Pulse: 71 (!) 59 65   Resp: 20 (!) 32 (!) 25 (!) 24  Temp:   98.5 F (36.9 C)   TempSrc:   Oral   SpO2: 91% 93%  95%  Weight:      Height:       No intake or output data in the 24 hours ending 08/30/23 0904 Filed Weights   08/29/23 1958  Weight: 106.6 kg    Examination:  General exam: Appears lethargic  Respiratory system: decreased breath sounds b/l  Cardiovascular system: S1 & S2+. No rubs, gallops or clicks.  Gastrointestinal system: Abdomen is nondistended, soft and nontender. Normal bowel sounds heard. Central nervous system: lethargic  Psychiatry: Judgement and insight appears not at baseline. Flat mood and affect    Data Reviewed: I have personally reviewed following labs and imaging studies  CBC: Recent Labs  Lab 08/29/23 1958 08/30/23 0405  WBC 3.8* 4.3  NEUTROABS 2.4  --   HGB 7.4* 7.0*  HCT 25.1* 23.7*  MCV 94.7 94.4  PLT 95* 99*   Basic Metabolic Panel: Recent Labs  Lab 08/29/23 1958 08/30/23 0405  NA 138 138  K 4.4 4.7  CL 98 94*  CO2 25 31  GLUCOSE 97 94  BUN  41* 47*  CREATININE 9.88* 10.35*  CALCIUM  8.3* 8.1*   GFR: Estimated Creatinine Clearance: 9.8 mL/min (A) (by C-G formula based on SCr of 10.35 mg/dL (H)). Liver Function Tests: Recent Labs  Lab 08/29/23 1958  AST 22  ALT 13  ALKPHOS 69  BILITOT 0.9  PROT 6.7  ALBUMIN  2.7*   No results for input(s): LIPASE, AMYLASE in the last 168 hours. No results for input(s): AMMONIA in the last 168 hours. Coagulation Profile: No results for input(s): INR, PROTIME in the last 168 hours. Cardiac Enzymes: No results for input(s): CKTOTAL, CKMB, CKMBINDEX, TROPONINI in the last 168 hours. BNP  (last 3 results) No results for input(s): PROBNP in the last 8760 hours. HbA1C: No results for input(s): HGBA1C in the last 72 hours. CBG: No results for input(s): GLUCAP in the last 168 hours. Lipid Profile: No results for input(s): CHOL, HDL, LDLCALC, TRIG, CHOLHDL, LDLDIRECT in the last 72 hours. Thyroid  Function Tests: No results for input(s): TSH, T4TOTAL, FREET4, T3FREE, THYROIDAB in the last 72 hours. Anemia Panel: No results for input(s): VITAMINB12, FOLATE, FERRITIN, TIBC, IRON , RETICCTPCT in the last 72 hours. Sepsis Labs: No results for input(s): PROCALCITON, LATICACIDVEN in the last 168 hours.  Recent Results (from the past 240 hours)  Resp panel by RT-PCR (RSV, Flu A&B, Covid) Anterior Nasal Swab     Status: None   Collection Time: 08/29/23  8:00 PM   Specimen: Anterior Nasal Swab  Result Value Ref Range Status   SARS Coronavirus 2 by RT PCR NEGATIVE NEGATIVE Final    Comment: (NOTE) SARS-CoV-2 target nucleic acids are NOT DETECTED.  The SARS-CoV-2 RNA is generally detectable in upper respiratory specimens during the acute phase of infection. The lowest concentration of SARS-CoV-2 viral copies this assay can detect is 138 copies/mL. A negative result does not preclude SARS-Cov-2 infection and should not be used as the sole basis for treatment or other patient management decisions. A negative result may occur with  improper specimen collection/handling, submission of specimen other than nasopharyngeal swab, presence of viral mutation(s) within the areas targeted by this assay, and inadequate number of viral copies(<138 copies/mL). A negative result must be combined with clinical observations, patient history, and epidemiological information. The expected result is Negative.  Fact Sheet for Patients:  BloggerCourse.com  Fact Sheet for Healthcare Providers:   SeriousBroker.it  This test is no t yet approved or cleared by the [place]  FDA and  has been authorized for detection and/or diagnosis of SARS-CoV-2 by FDA under an Emergency Use Authorization (EUA). This EUA will remain  in effect (meaning this test can be used) for the duration of the COVID-19 declaration under Section 564(b)(1) of the Act, 21 U.S.C.section 360bbb-3(b)(1), unless the authorization is terminated  or revoked sooner.       Influenza A by PCR NEGATIVE NEGATIVE Final   Influenza B by PCR NEGATIVE NEGATIVE Final    Comment: (NOTE) The Xpert Xpress SARS-CoV-2/FLU/RSV plus assay is intended as an aid in the diagnosis of influenza from Nasopharyngeal swab specimens and should not be used as a sole basis for treatment. Nasal washings and aspirates are unacceptable for Xpert Xpress SARS-CoV-2/FLU/RSV testing.  Fact Sheet for Patients: BloggerCourse.com  Fact Sheet for Healthcare Providers: SeriousBroker.it  This test is not yet approved or cleared by the [place]  FDA and has been authorized for detection and/or diagnosis of SARS-CoV-2 by FDA under an Emergency Use Authorization (EUA). This EUA will remain in effect (meaning this test can be used)  for the duration of the COVID-19 declaration under Section 564(b)(1) of the Act, 21 U.S.C. section 360bbb-3(b)(1), unless the authorization is terminated or revoked.     Resp Syncytial Virus by PCR NEGATIVE NEGATIVE Final    Comment: (NOTE) Fact Sheet for Patients: BloggerCourse.com  Fact Sheet for Healthcare Providers: SeriousBroker.it  This test is not yet approved or cleared by the [place]  FDA and has been authorized for detection and/or diagnosis of SARS-CoV-2 by FDA under an Emergency Use Authorization (EUA). This EUA will remain in effect (meaning this test can be used) for  the duration of the COVID-19 declaration under Section 564(b)(1) of the Act, 21 U.S.C. section 360bbb-3(b)(1), unless the authorization is terminated or revoked.  Performed at [facility], [address], [place], [place] [address]   Blood Culture (routine x 2)     Status: None (Preliminary result)   Collection Time: 08/30/23 12:06 AM   Specimen: BLOOD RIGHT HAND  Result Value Ref Range Status   Specimen Description BLOOD RIGHT HAND  Final   Special Requests   Final    BOTTLES DRAWN AEROBIC AND ANAEROBIC Blood Culture results may not be optimal due to an inadequate volume of blood received in culture bottles   Culture   Final    NO GROWTH < 12 HOURS Performed at [facility], [address], [place], [place] [address]    Report Status PENDING  Incomplete  Blood Culture (routine x 2)     Status: None (Preliminary result)   Collection Time: 08/30/23 12:06 AM   Specimen: BLOOD RIGHT ARM  Result Value Ref Range Status   Specimen Description BLOOD RIGHT ARM  Final   Special Requests   Final    BOTTLES DRAWN AEROBIC AND ANAEROBIC Blood Culture results may not be optimal due to an inadequate volume of blood received in culture bottles   Culture   Final    NO GROWTH < 12 HOURS Performed at [facility], [address] [place], [place] [address]    Report Status PENDING  Incomplete         Radiology Studies: CT Chest Wo Contrast Result Date: 08/29/2023 CLINICAL DATA:  Respiratory illness, nondiagnostic xray recent hemothorax, new respiratory symptoms, short of breath, recent history of sepsis EXAM: CT CHEST WITHOUT CONTRAST TECHNIQUE: Multidetector CT imaging of the chest was performed following the standard protocol without IV contrast. RADIATION DOSE REDUCTION: This exam was performed according to the departmental dose-optimization program which includes automated exposure control, adjustment of the mA and/or kV according to patient size and/or use of  iterative reconstruction technique. COMPARISON:  08/11/2023, 08/05/2023 FINDINGS: Cardiovascular: Unenhanced imaging of the heart demonstrates stable cardiomegaly without pericardial effusion. Normal caliber of the thoracic aorta. Atherosclerosis of the aorta and coronary vasculature. Mediastinum/Nodes: Borderline enlarged mediastinal lymph nodes, largest measuring 10 mm in the right paratracheal region. Thyroid , trachea, and esophagus are unremarkable. Lungs/Pleura: Scattered nodular ground-glass airspace disease is seen bilaterally, greatest within the lower lobes. Findings are consistent with infection, aspiration, or hemorrhage. No effusion or pneumothorax. The central airways are patent. Upper Abdomen: Central venous catheter from an inferior approach, tip at the atriocaval junction. No acute upper abdominal findings. Musculoskeletal: No acute or destructive bony abnormalities. Reconstructed images demonstrate no additional findings. IMPRESSION: 1. Scattered bilateral nodular ground-glass airspace disease, greatest in the lower lobes, favor bilateral pneumonia given clinical presentation. 2. Cardiomegaly. 3. Borderline mediastinal adenopathy, likely reactive. 4. Aortic Atherosclerosis (ICD10-I70.0). Coronary artery atherosclerosis. Electronically Signed   By: Bambi Lever  Bevin Bucks M.D.   On: 08/29/2023 20:38        Scheduled Meds:  amiodarone   200 mg Oral BID   apixaban   2.5 mg Oral BID   aspirin  EC  81 mg Oral Daily   carvedilol   6.25 mg Oral Daily   Chlorhexidine  Gluconate Cloth  6 each Topical Q0600   iron  polysaccharides  150 mg Oral Daily   rosuvastatin   40 mg Oral Daily   sevelamer  carbonate  2,400 mg Oral TID WC   traZODone   100 mg Oral QHS   Continuous Infusions:  sodium chloride      piperacillin -tazobactam (ZOSYN )  IV Stopped (08/30/23 0200)   vancomycin        LOS: 1 day     Alphonsus Jeans, MD [facility] Pager [phone]  If 7PM-7AM, please contact  night-coverage [email or web address] 08/30/2023, 9:04 AM

## 2023-08-30 NOTE — Progress Notes (Signed)
 PHARMACY - PHYSICIAN COMMUNICATION CRITICAL VALUE ALERT - BLOOD CULTURE IDENTIFICATION (BCID)  Adam Howell is an 63 y.o. male who presented to [facility] on 08/29/2023 with a chief complaint of shortness of breath   Name of physician (or Provider) Contacted: Dr Broadus Canes  Result: Updated Gram stain result to include that 1 of 4 bottles (anaerobic) growing Gram-positive rods  Current antibiotics: Vancomycin  and Zosyn   Changes to prescribed antibiotics recommended:  Patient is on recommended antibiotics - No changes needed  Results for orders placed or performed during the hospital encounter of 08/29/23  Blood Culture ID Panel (Reflexed) (Collected: 08/30/2023 12:06 AM)  Result Value Ref Range   Enterococcus faecalis NOT DETECTED NOT DETECTED   Enterococcus Faecium NOT DETECTED NOT DETECTED   Listeria monocytogenes NOT DETECTED NOT DETECTED   Staphylococcus species DETECTED (A) NOT DETECTED   Staphylococcus aureus (BCID) DETECTED (A) NOT DETECTED   Staphylococcus epidermidis NOT DETECTED NOT DETECTED   Staphylococcus lugdunensis NOT DETECTED NOT DETECTED   Streptococcus species NOT DETECTED NOT DETECTED   Streptococcus agalactiae NOT DETECTED NOT DETECTED   Streptococcus pneumoniae NOT DETECTED NOT DETECTED   Streptococcus pyogenes NOT DETECTED NOT DETECTED   A.calcoaceticus-baumannii NOT DETECTED NOT DETECTED   Bacteroides fragilis NOT DETECTED NOT DETECTED   Enterobacterales NOT DETECTED NOT DETECTED   Enterobacter cloacae complex NOT DETECTED NOT DETECTED   Escherichia coli NOT DETECTED NOT DETECTED   Klebsiella aerogenes NOT DETECTED NOT DETECTED   Klebsiella oxytoca NOT DETECTED NOT DETECTED   Klebsiella pneumoniae NOT DETECTED NOT DETECTED   Proteus species NOT DETECTED NOT DETECTED   Salmonella species NOT DETECTED NOT DETECTED   Serratia marcescens NOT DETECTED NOT DETECTED   Haemophilus influenzae NOT DETECTED NOT DETECTED   Neisseria meningitidis NOT DETECTED NOT  DETECTED   Pseudomonas aeruginosa NOT DETECTED NOT DETECTED   Stenotrophomonas maltophilia NOT DETECTED NOT DETECTED   Candida albicans NOT DETECTED NOT DETECTED   Candida auris NOT DETECTED NOT DETECTED   Candida glabrata NOT DETECTED NOT DETECTED   Candida krusei NOT DETECTED NOT DETECTED   Candida parapsilosis NOT DETECTED NOT DETECTED   Candida tropicalis NOT DETECTED NOT DETECTED   Cryptococcus neoformans/gattii NOT DETECTED NOT DETECTED   Meth resistant mecA/C and MREJ DETECTED (A) NOT DETECTED    Raquel Rodriguez-Guzman PharmD, BCPS 08/30/2023 5:02 PM

## 2023-08-30 NOTE — Progress Notes (Signed)
 Hemodialysis Note:  Received patient in bed to unit. Alert and oriented. Informed consent singed and in chart.  Treatment initiated: 0840 Treatment completed: 1219  Access used: Right Femoral catheter Access issues: None  1 unit of blood was given during treatment. No reactions noted. Patient tolerated well. Transported back to room, alert without acute distress. Report given to patient's RN.  Total UF removed: 2 liters Medications given: Vancomycin  1 gm IV  Post HD weight: unable to take weight, bed scale not working  [facility]

## 2023-08-30 NOTE — Progress Notes (Signed)
 [facility]  ROUNDING NOTE   Subjective:   Adam Howell is a 63 y.o male with past medical history of diabetes, hypertension, CAD, Rt AKA, anemia, CHF, and ESRD on dialysis. Patient presents to vascular surgery for revision of AVF. He is being admitted for Aspiration pneumonia (HCC) [J69.0] HCAP (healthcare-associated pneumonia) [J18.9] Pneumonia of both lungs due to infectious organism, unspecified part of lung [J18.9] Acute hypoxic respiratory failure (HCC) [J96.01]  Patient presented to the hospital for shortness of breath.  He was diagnosed with pneumonia and is being admitted for further management.  At admission his blood pressure was in the low 80s.  Given IV fluid bolus and set up for transfusion because of low hemoglobin of 7.0.   Objective:  Vital signs in last 24 hours:  Temp:  [98.3 F (36.8 C)-98.9 F (37.2 C)] 98.5 F (36.9 C) (06/16 0823) Pulse Rate:  [56-71] 65 (06/16 0823) Resp:  [16-32] 25 (06/16 0823) BP: (79-130)/(45-71) 108/62 (06/16 0823) SpO2:  [91 %-100 %] 93 % (06/16 0803) Weight:  [106.6 kg] 106.6 kg (06/15 1958)  Weight change:  Filed Weights   08/29/23 1958  Weight: 106.6 kg    Intake/Output: No intake/output data recorded.   Intake/Output this shift:  No intake/output data recorded.  Physical Exam: General: No acute distress  Head: Huey/AT, hearing intact  Eyes: Anicteric  Lungs:  Bilateral coarse breath sounds  Heart: Regular rate and rhythm  Abdomen:  Soft, nontender, obese  Extremities: No peripheral edema  Right AKA  Neurologic: Alert and oriented  Skin: No lesions  Access: Left aVF (resting) right femoral permcath    Basic Metabolic Panel: Recent Labs  Lab 08/29/23 1958 08/30/23 0405  NA 138 138  K 4.4 4.7  CL 98 94*  CO2 25 31  GLUCOSE 97 94  BUN 41* 47*  CREATININE 9.88* 10.35*  CALCIUM  8.3* 8.1*    Liver Function Tests: Recent Labs  Lab 08/29/23 1958  AST 22  ALT 13  ALKPHOS 69  BILITOT 0.9   PROT 6.7  ALBUMIN  2.7*   No results for input(s): LIPASE, AMYLASE in the last 168 hours. No results for input(s): AMMONIA in the last 168 hours.  CBC: Recent Labs  Lab 08/29/23 1958 08/30/23 0405  WBC 3.8* 4.3  NEUTROABS 2.4  --   HGB 7.4* 7.0*  HCT 25.1* 23.7*  MCV 94.7 94.4  PLT 95* 99*    Cardiac Enzymes: No results for input(s): CKTOTAL, CKMB, CKMBINDEX, TROPONINI in the last 168 hours.  BNP: Invalid input(s): POCBNP  CBG: No results for input(s): GLUCAP in the last 168 hours.   Microbiology: Results for orders placed or performed during the hospital encounter of 08/29/23  Resp panel by RT-PCR (RSV, Flu A&B, Covid) Anterior Nasal Swab     Status: None   Collection Time: 08/29/23  8:00 PM   Specimen: Anterior Nasal Swab  Result Value Ref Range Status   SARS Coronavirus 2 by RT PCR NEGATIVE NEGATIVE Final    Comment: (NOTE) SARS-CoV-2 target nucleic acids are NOT DETECTED.  The SARS-CoV-2 RNA is generally detectable in upper respiratory specimens during the acute phase of infection. The lowest concentration of SARS-CoV-2 viral copies this assay can detect is 138 copies/mL. A negative result does not preclude SARS-Cov-2 infection and should not be used as the sole basis for treatment or other patient management decisions. A negative result may occur with  improper specimen collection/handling, submission of specimen other than nasopharyngeal swab, presence of viral  mutation(s) within the areas targeted by this assay, and inadequate number of viral copies(<138 copies/mL). A negative result must be combined with clinical observations, patient history, and epidemiological information. The expected result is Negative.  Fact Sheet for Patients:  BloggerCourse.com  Fact Sheet for Healthcare Providers:  SeriousBroker.it  This test is no t yet approved or cleared by the [place]  FDA and  has  been authorized for detection and/or diagnosis of SARS-CoV-2 by FDA under an Emergency Use Authorization (EUA). This EUA will remain  in effect (meaning this test can be used) for the duration of the COVID-19 declaration under Section 564(b)(1) of the Act, 21 U.S.C.section 360bbb-3(b)(1), unless the authorization is terminated  or revoked sooner.       Influenza A by PCR NEGATIVE NEGATIVE Final   Influenza B by PCR NEGATIVE NEGATIVE Final    Comment: (NOTE) The Xpert Xpress SARS-CoV-2/FLU/RSV plus assay is intended as an aid in the diagnosis of influenza from Nasopharyngeal swab specimens and should not be used as a sole basis for treatment. Nasal washings and aspirates are unacceptable for Xpert Xpress SARS-CoV-2/FLU/RSV testing.  Fact Sheet for Patients: BloggerCourse.com  Fact Sheet for Healthcare Providers: SeriousBroker.it  This test is not yet approved or cleared by the [place]  FDA and has been authorized for detection and/or diagnosis of SARS-CoV-2 by FDA under an Emergency Use Authorization (EUA). This EUA will remain in effect (meaning this test can be used) for the duration of the COVID-19 declaration under Section 564(b)(1) of the Act, 21 U.S.C. section 360bbb-3(b)(1), unless the authorization is terminated or revoked.     Resp Syncytial Virus by PCR NEGATIVE NEGATIVE Final    Comment: (NOTE) Fact Sheet for Patients: BloggerCourse.com  Fact Sheet for Healthcare Providers: SeriousBroker.it  This test is not yet approved or cleared by the [place]  FDA and has been authorized for detection and/or diagnosis of SARS-CoV-2 by FDA under an Emergency Use Authorization (EUA). This EUA will remain in effect (meaning this test can be used) for the duration of the COVID-19 declaration under Section 564(b)(1) of the Act, 21 U.S.C. section 360bbb-3(b)(1), unless the  authorization is terminated or revoked.  Performed at [facility], [address] [place], [place] [address]   Blood Culture (routine x 2)     Status: None (Preliminary result)   Collection Time: 08/30/23 12:06 AM   Specimen: BLOOD RIGHT HAND  Result Value Ref Range Status   Specimen Description BLOOD RIGHT HAND  Final   Special Requests   Final    BOTTLES DRAWN AEROBIC AND ANAEROBIC Blood Culture results may not be optimal due to an inadequate volume of blood received in culture bottles   Culture   Final    NO GROWTH < 12 HOURS Performed at [facility], [address], [place], [place] [address]    Report Status PENDING  Incomplete  Blood Culture (routine x 2)     Status: None (Preliminary result)   Collection Time: 08/30/23 12:06 AM   Specimen: BLOOD RIGHT ARM  Result Value Ref Range Status   Specimen Description BLOOD RIGHT ARM  Final   Special Requests   Final    BOTTLES DRAWN AEROBIC AND ANAEROBIC Blood Culture results may not be optimal due to an inadequate volume of blood received in culture bottles   Culture   Final    NO GROWTH < 12 HOURS Performed at [facility], [address] [place], [place] [address]    Report Status PENDING  Incomplete    Coagulation Studies: No results for input(s): LABPROT, INR in the last 72 hours.   Urinalysis: No results for input(s): COLORURINE, LABSPEC, PHURINE, GLUCOSEU, HGBUR, BILIRUBINUR, KETONESUR, PROTEINUR, UROBILINOGEN, NITRITE, LEUKOCYTESUR in the last 72 hours.  Invalid input(s): APPERANCEUR    Imaging: CT Chest Wo Contrast Result Date: 08/29/2023 CLINICAL DATA:  Respiratory illness, nondiagnostic xray recent hemothorax, new respiratory symptoms, short of breath, recent history of sepsis EXAM: CT CHEST WITHOUT CONTRAST TECHNIQUE: Multidetector CT imaging of the chest was performed following the standard protocol without IV contrast. RADIATION DOSE REDUCTION: This  exam was performed according to the departmental dose-optimization program which includes automated exposure control, adjustment of the mA and/or kV according to patient size and/or use of iterative reconstruction technique. COMPARISON:  08/11/2023, 08/05/2023 FINDINGS: Cardiovascular: Unenhanced imaging of the heart demonstrates stable cardiomegaly without pericardial effusion. Normal caliber of the thoracic aorta. Atherosclerosis of the aorta and coronary vasculature. Mediastinum/Nodes: Borderline enlarged mediastinal lymph nodes, largest measuring 10 mm in the right paratracheal region. Thyroid , trachea, and esophagus are unremarkable. Lungs/Pleura: Scattered nodular ground-glass airspace disease is seen bilaterally, greatest within the lower lobes. Findings are consistent with infection, aspiration, or hemorrhage. No effusion or pneumothorax. The central airways are patent. Upper Abdomen: Central venous catheter from an inferior approach, tip at the atriocaval junction. No acute upper abdominal findings. Musculoskeletal: No acute or destructive bony abnormalities. Reconstructed images demonstrate no additional findings. IMPRESSION: 1. Scattered bilateral nodular ground-glass airspace disease, greatest in the lower lobes, favor bilateral pneumonia given clinical presentation. 2. Cardiomegaly. 3. Borderline mediastinal adenopathy, likely reactive. 4. Aortic Atherosclerosis (ICD10-I70.0). Coronary artery atherosclerosis. Electronically Signed   By: Bobbye Burrow M.D.   On: 08/29/2023 20:38      Medications:    sodium chloride      piperacillin -tazobactam (ZOSYN )  IV Stopped (08/30/23 0200)   vancomycin        amiodarone   200 mg Oral BID   apixaban   2.5 mg Oral BID   aspirin  EC  81 mg Oral Daily   carvedilol   6.25 mg Oral Daily   Chlorhexidine  Gluconate Cloth  6 each Topical Q0600   iron  polysaccharides  150 mg Oral Daily   rosuvastatin   40 mg Oral Daily   sevelamer  carbonate  2,400 mg Oral TID WC    traZODone   100 mg Oral QHS   acetaminophen , albuterol , dextromethorphan-guaiFENesin , diphenhydrAMINE , melatonin, midodrine , oxyCODONE -acetaminophen   Assessment/ Plan:  [name] is a 63 y.o.  male with end stage renal disease on hemodialysis, diabetes, hypertension, CAD, Rt AKA, anemia, CHF who was admitted to [facility] on 08/29/2023 for revision of AVF. Patient had anaphylactic reaction and admitted to ICU.   [facility]/MWF/left AVF  End-stage renal disease with hyperkalemia on hemodialysis.   -We will arrange for hemodialysis today.  Total of 2000 cc of fluid was removed with dialysis treatment.  Patient tolerated well.  2. Anemia of chronic kidney disease Lab Results  Component Value Date   HGB 7.0 (L) 08/30/2023   Continue Epogen  10,000 units IV with dialysis treatments. Given 1 unit blood transfusion during dialysis today.  3. Secondary Hyperparathyroidism: with outpatient labs:     Lab Results  Component Value Date   CALCIUM  8.1 (L) 08/30/2023   CAION 1.06 (L) 08/05/2023   PHOS 2.5 08/14/2023     4. Pneumonia and shortness of breath  - iv Abx as per hospitalist team  - Remains on 4 L nasal cannula oxygen supplementation   LOS: 1 Kandise Riehle 6/16/20258:33  AM

## 2023-08-30 NOTE — ED Notes (Signed)
 MD notified about pt's trending BP with most recent readings.

## 2023-08-30 NOTE — Consult Note (Signed)
 Infectious Disease     Reason for Consult:MRSA bacteremia    Referring Physician: Dr Broadus Canes Date of Admission:  08/29/2023   Principal Problem:   HCAP (healthcare-associated pneumonia) Active Problems:   ESRD on dialysis [facility])   Type 2 diabetes mellitus with ESRD (end-stage renal disease) (HCC)   Chronic pain syndrome   Atrial fibrillation, chronic (HCC)   Hyperlipidemia associated with type 2 diabetes mellitus (HCC)   Aspiration pneumonia (HCC)   Chronic diastolic CHF (congestive heart failure) (HCC)   Pancytopenia (HCC)   Anemia in ESRD (end-stage renal disease) (HCC)   Overweight (BMI 25.0-29.9)   CAD (coronary artery disease)   Myocardial injury   HPI: Adam Howell is a 63 y.o. male with medical history significant of ESRD-HD (MWF), HTN, HLD, DM, CAD, dCHF, A fib on Eliquis , amenia, chronic pain, s/p of right AKA, who presents with SOB and found to have MRSA bacteremia. On admit no fevers. CT chest scattered nodular densities, Echo pending.  Sputum cx, strep and leg urine ag pending. Covid flu rsv neg Started vanco zosyn .  He gets HD through R groin HD cath but denies any pain, redness or drainage at site. He has R BKA. He has a chronic L ant shin wound with some purulence. His L AVF site has some bleeding and drainage as well.    Patient recently had complicated admission from 5/22 - 5/29.  At that time he presented for an elective AV fistula revision.  During admission he had hypotension with PermCath placement which was concerning for bleeding and possible IV contrast reaction, required intubation and vasopressor support, extubated on 5/26.  Pt was discharged with plans for dialysis using right groin permacatheter.    Past Medical History:  Diagnosis Date   Anemia in chronic kidney disease    Cellulitis of left lower extremity    Chronic atrial fibrillation (HCC)    Chronic kidney disease    Congestive heart failure (HCC)    Difficult intubation    End-stage renal  disease on hemodialysis (HCC)    Hyperlipidemia    Hypertension    Type 2 diabetes mellitus with complication [facility])    Past Surgical History:  Procedure Laterality Date   A/V FISTULAGRAM Left 08/27/2022   Procedure: A/V Fistulagram;  Surgeon: Celso College, MD;  Location: [facility] INVASIVE CV LAB;  Service: Cardiovascular;  Laterality: Left;   A/V FISTULAGRAM Left 08/12/2023   Procedure: A/V Fistulagram;  Surgeon: Celso College, MD;  Location: [facility] INV[facility] CV LAB;  Service: Cardiovascular;  Laterality: Left;   AMPUTATION Right 03/13/2022   Procedure: AMPUTATION ABOVE KNEE;  Surgeon: Celso College, MD;  Location: [facility] ORS;  Service: General;  Laterality: Right;   APPLICATION OF WOUND VAC Right 03/07/2022   Procedure: APPLICATION OF WOUND VAC;  Surgeon: Lesta Rater, MD;  Location: [facility] ORS;  Service: Vascular;  Laterality: Right;  [place]AC [id number]   CENTRAL LINE INSERTION Left 08/05/2023   Procedure: CENTRAL LINE INSERTION;  Surgeon: Celso College, MD;  Location: [facility] ORS;  Service: Vascular;  Laterality: Left;   DIALYSIS/PERMA CATHETER INSERTION Right 08/05/2023   Procedure: DIALYSIS/PERMA CATHETER INSERTION;  Surgeon: Celso College, MD;  Location: [facility] ORS;  Service: Vascular;  Laterality: Right;   GROIN DISSECTION Right 08/05/2023   Procedure: right groin venogram, angioplasty of superior venacava and anomic vein;  Surgeon: Celso College, MD;  Location: [facility] ORS;  Service: Vascular;  Laterality: Right;   LOWER EXTREMITY ANGIOGRAPHY Left 02/15/2023  Procedure: Lower Extremity Angiography;  Surgeon: Celso College, MD;  Location: [facility];  Service: Cardiovascular;  Laterality: Left;   REVISON OF ARTERIOVENOUS FISTULA Left 08/05/2023   Procedure: REVISON OF ARTERIOVENOUS FISTULA;  Surgeon: Celso College, MD;  Location: [facility] ORS;  Service: Vascular;  Laterality: Left;  JUMP GRAFT   WOUND DEBRIDEMENT Left    WOUND DEBRIDEMENT Right 01/16/2022   Procedure: DEBRIDEMENT WOUND;  Surgeon: Jackquelyn Mass, MD;  Location: [facility] ORS;  Service: Vascular;  Laterality: Right;  Wound vac placement   WOUND DEBRIDEMENT Right 03/07/2022   Procedure: DEBRIDEMENT WOUND RIGHT LOWER EXTREMITY;  Surgeon: Lesta Rater, MD;  Location: [facility] ORS;  Service: Vascular;  Laterality: Right;   Social History   Tobacco Use   Smoking status: Never   Smokeless tobacco: Never  Vaping Use   Vaping status: Never Used  Substance Use Topics   Alcohol use: Not Currently   Drug use: Never   Family History  Problem Relation Age of Onset   Heart failure Mother    Cirrhosis Father    Hypertension Daughter     Allergies:  Allergies  Allergen Reactions   Ivp Dye [Iodinated Contrast Media] Anaphylaxis   Tramadol  Rash    Current antibiotics: Antibiotics Given (last 72 hours)     Date/Time Action Medication Dose Rate   08/30/23 0039 New Bag/Given   vancomycin  (VANCOREADY) IVPB 1250 mg/250 mL 1,250 mg 166.7 mL/hr   08/30/23 0040 New Bag/Given   piperacillin -tazobactam (ZOSYN ) IVPB 2.25 g 2.25 g 100 mL/hr   08/30/23 0981 New Bag/Given   vancomycin  (VANCOREADY) IVPB 1250 mg/250 mL 1,250 mg 166.7 mL/hr   08/30/23 1109 New Bag/Given   vancomycin  (VANCOCIN ) IVPB 1000 mg/200 mL premix 1,000 mg 200 mL/hr       MEDICATIONS:  sodium chloride    Intravenous Once   amiodarone   200 mg Oral BID   apixaban   2.5 mg Oral BID   aspirin  EC  81 mg Oral Daily   carvedilol   6.25 mg Oral Daily   Chlorhexidine  Gluconate Cloth  6 each Topical Q0600   [START ON 09/01/2023] epoetin  alfa-epbx (RETACRIT ) injection  10,000 Units Intravenous Q M,W,F-1800   iron  polysaccharides  150 mg Oral Daily   rosuvastatin   40 mg Oral Daily   sevelamer  carbonate  2,400 mg Oral TID WC   traZODone   100 mg Oral QHS    Review of Systems - 11 systems reviewed and negative per HPI   OBJECTIVE: Temp:  [97.6 F (36.4 C)-98.9 F (37.2 C)] 98.7 F (37.1 C) (06/16 1219) Pulse Rate:  [54-71] 62 (06/16 1430) Resp:  [16-32] 26 (06/16 1430) BP:  (79-130)/(45-88) 110/88 (06/16 1430) SpO2:  [91 %-100 %] 100 % (06/16 1430) Weight:  [106.6 kg] 106.6 kg (06/15 1958) Physical Exam  Constitutional: He is awake and interactive.  HENT: Mouth/Throat: Oropharynx is clear and moist. No oropharyngeal exudate.  Cardiovascular: Normal rate, regular rhythm and normal heart sounds. 2/6 sm Pulmonary/Chest: Effort normal and breath sounds normal. No respiratory distress. He has no wheezes.  Abdominal: Soft. Bowel sounds are normal. He exhibits no distension. There is no tenderness.  Lymphadenopathy: He has no cervical adenopathy.  Neurological: [name] and oriented to person, place, and time.  Skin:  L AVF site- large scab over distal site, some purulence over prior incision. No thrill      L ant shin wound with mild purulence     LABS: Results for orders placed or performed  during the hospital encounter of 08/29/23 (from the past 48 hours)  CBC with Differential     Status: Abnormal   Collection Time: 08/29/23  7:58 PM  Result Value Ref Range   WBC 3.8 (L) 4.0 - 10.5 K/uL   RBC 2.65 (L) 4.22 - 5.81 MIL/uL   Hemoglobin 7.4 (L) 13.0 - 17.0 g/dL   HCT 21.3 (L) 08.6 - 57.8 %   MCV 94.7 80.0 - 100.0 fL   MCH 27.9 26.0 - 34.0 pg   MCHC 29.5 (L) 30.0 - 36.0 g/dL   RDW 46.9 (H) 62.9 - 52.8 %   Platelets 95 (L) 150 - 400 K/uL    Comment: PLATELET COUNT CONFIRMED BY SMEAR   nRBC 0.0 0.0 - 0.2 %   Neutrophils Relative % 62 %   Neutro Abs 2.4 1.7 - 7.7 K/uL   Lymphocytes Relative 19 %   Lymphs Abs 0.7 0.7 - 4.0 K/uL   Monocytes Relative 13 %   Monocytes Absolute 0.5 0.1 - 1.0 K/uL   Eosinophils Relative 4 %   Eosinophils Absolute 0.2 0.0 - 0.5 K/uL   Basophils Relative 1 %   Basophils Absolute 0.0 0.0 - 0.1 K/uL   Immature Granulocytes 1 %   Abs Immature Granulocytes 0.02 0.00 - 0.07 K/uL    Comment: Performed at [facility], [address] [place], [place] [address]  Comprehensive metabolic panel     Status: Abnormal    Collection Time: 08/29/23  7:58 PM  Result Value Ref Range   Sodium 138 135 - 145 mmol/L   Potassium 4.4 3.5 - 5.1 mmol/L   Chloride 98 98 - 111 mmol/L   CO2 25 22 - 32 mmol/L   Glucose, Bld 97 70 - 99 mg/dL    Comment: Glucose reference range applies only to samples taken after fasting for at least 8 hours.   BUN 41 (H) 8 - 23 mg/dL   Creatinine, Ser 4.01 (H) 0.61 - 1.24 mg/dL   Calcium  8.3 (L) 8.9 - 10.3 mg/dL   Total Protein 6.7 6.5 - 8.1 g/dL   Albumin  2.7 (L) 3.5 - 5.0 g/dL   AST 22 15 - 41 U/L   ALT 13 0 - 44 U/L   Alkaline Phosphatase 69 38 - 126 U/L   Total Bilirubin 0.9 0.0 - 1.2 mg/dL   GFR, Estimated 5 (L) >60 mL/min    Comment: (NOTE) Calculated using the CKD-EPI Creatinine Equation (2021)    Anion gap 15 5 - 15    Comment: Performed at [facility], [address] [place], [place] [address]  Troponin I (High Sensitivity)     Status: Abnormal   Collection Time: 08/29/23  7:58 PM  Result Value Ref Range   Troponin I (High Sensitivity) 107 (HH) <18 ng/L    Comment: CRITICAL RESULT CALLED TO, READ BACK BY AND VERIFIED WITH CHRISTINA CARTER, RN 08/29/2023 2122 JL/KG (NOTE) Elevated high sensitivity troponin I (hsTnI) values and significant  changes across serial measurements may suggest ACS but many other  chronic and acute conditions are known to elevate hsTnI results.  Refer to the Links section for chest pain algorithms and additional  guidance. Performed at [facility], [address] [place], [place] [address]   Resp panel by RT-PCR (RSV, Flu A&B, Covid) Anterior Nasal Swab     Status: None   Collection Time: 08/29/23  8:00 PM   Specimen: Anterior Nasal Swab  Result Value Ref Range   SARS  Coronavirus 2 by RT PCR NEGATIVE NEGATIVE    Comment: (NOTE) SARS-CoV-2 target nucleic acids are NOT DETECTED.  The SARS-CoV-2 RNA is generally detectable in upper respiratory specimens during the acute phase of infection. The  lowest concentration of SARS-CoV-2 viral copies this assay can detect is 138 copies/mL. A negative result does not preclude SARS-Cov-2 infection and should not be used as the sole basis for treatment or other patient management decisions. A negative result may occur with  improper specimen collection/handling, submission of specimen other than nasopharyngeal swab, presence of viral mutation(s) within the areas targeted by this assay, and inadequate number of viral copies(<138 copies/mL). A negative result must be combined with clinical observations, patient history, and epidemiological information. The expected result is Negative.  Fact Sheet for Patients:  BloggerCourse.com  Fact Sheet for Healthcare Providers:  SeriousBroker.it  This test is no t yet approved or cleared by the [place]  FDA and  has been authorized for detection and/or diagnosis of SARS-CoV-2 by FDA under an Emergency Use Authorization (EUA). This EUA will remain  in effect (meaning this test can be used) for the duration of the COVID-19 declaration under Section 564(b)(1) of the Act, 21 U.S.C.section 360bbb-3(b)(1), unless the authorization is terminated  or revoked sooner.       Influenza A by PCR NEGATIVE NEGATIVE   Influenza B by PCR NEGATIVE NEGATIVE    Comment: (NOTE) The Xpert Xpress SARS-CoV-2/FLU/RSV plus assay is intended as an aid in the diagnosis of influenza from Nasopharyngeal swab specimens and should not be used as a sole basis for treatment. Nasal washings and aspirates are unacceptable for Xpert Xpress SARS-CoV-2/FLU/RSV testing.  Fact Sheet for Patients: BloggerCourse.com  Fact Sheet for Healthcare Providers: SeriousBroker.it  This test is not yet approved or cleared by the [place]  FDA and has been authorized for detection and/or diagnosis of SARS-CoV-2 by FDA under an Emergency  Use Authorization (EUA). This EUA will remain in effect (meaning this test can be used) for the duration of the COVID-19 declaration under Section 564(b)(1) of the Act, 21 U.S.C. section 360bbb-3(b)(1), unless the authorization is terminated or revoked.     Resp Syncytial Virus by PCR NEGATIVE NEGATIVE    Comment: (NOTE) Fact Sheet for Patients: BloggerCourse.com  Fact Sheet for Healthcare Providers: SeriousBroker.it  This test is not yet approved or cleared by the [place]  FDA and has been authorized for detection and/or diagnosis of SARS-CoV-2 by FDA under an Emergency Use Authorization (EUA). This EUA will remain in effect (meaning this test can be used) for the duration of the COVID-19 declaration under Section 564(b)(1) of the Act, 21 U.S.C. section 360bbb-3(b)(1), unless the authorization is terminated or revoked.  Performed at [facility], [address] [place], [place] [address]   Troponin I (High Sensitivity)     Status: Abnormal   Collection Time: 08/30/23 12:06 AM  Result Value Ref Range   Troponin I (High Sensitivity) 98 (H) <18 ng/L    Comment: (NOTE) Elevated high sensitivity troponin I (hsTnI) values and significant  changes across serial measurements may suggest ACS but many other  chronic and acute conditions are known to elevate hsTnI results.  Refer to the Links section for chest pain algorithms and additional  guidance. Performed at [facility], [address] [place], [place] [address]   Blood Culture (routine x 2)     Status: None (Preliminary result)   Collection Time: 08/30/23 12:06 AM   Specimen: BLOOD RIGHT HAND  Result  Value Ref Range   Specimen Description BLOOD RIGHT HAND    Special Requests      BOTTLES DRAWN AEROBIC AND ANAEROBIC Blood Culture results may not be optimal due to an inadequate volume of blood received in culture bottles   Culture  Setup Time       GRAM POSITIVE COCCI IN BOTH AEROBIC AND ANAEROBIC BOTTLES CRITICAL RESULT CALLED TO, READ BACK BY AND VERIFIED WITH: TREY GREENWOOD 08/30/23 1230 MW Performed at [facility], [address] [place], [place] [address]    Culture PENDING    Report Status PENDING   Blood Culture (routine x 2)     Status: None (Preliminary result)   Collection Time: 08/30/23 12:06 AM   Specimen: BLOOD RIGHT ARM  Result Value Ref Range   Specimen Description BLOOD RIGHT ARM    Special Requests      BOTTLES DRAWN AEROBIC AND ANAEROBIC Blood Culture results may not be optimal due to an inadequate volume of blood received in culture bottles   Culture  Setup Time      GRAM POSITIVE COCCI IN BOTH AEROBIC AND ANAEROBIC BOTTLES Organism ID to follow CRITICAL RESULT CALLED TO, READ BACK BY AND VERIFIED WITH: TREY GREENWOOD 08/30/23 1230 MW Performed at [facility], [address] [place], [place] [address]    Culture PENDING    Report Status PENDING   Blood Culture ID Panel (Reflexed)     Status: Abnormal   Collection Time: 08/30/23 12:06 AM  Result Value Ref Range   Enterococcus faecalis NOT DETECTED NOT DETECTED   Enterococcus Faecium NOT DETECTED NOT DETECTED   Listeria monocytogenes NOT DETECTED NOT DETECTED   Staphylococcus species DETECTED (A) NOT DETECTED    Comment: CRITICAL RESULT CALLED TO, READ BACK BY AND VERIFIED WITH: TREY GREENWOOD 08/30/23 1230 MW    Staphylococcus aureus (BCID) DETECTED (A) NOT DETECTED    Comment: Methicillin (oxacillin)-resistant Staphylococcus aureus (MRSA). MRSA is predictably resistant to beta-lactam antibiotics (except ceftaroline). Preferred therapy is vancomycin  unless clinically contraindicated. Patient requires contact precautions if  hospitalized. CRITICAL RESULT CALLED TO, READ BACK BY AND VERIFIED WITH: TREY GREENWOOD 08/30/23 1230 MW    Staphylococcus epidermidis NOT DETECTED NOT DETECTED   Staphylococcus lugdunensis NOT DETECTED NOT  DETECTED   Streptococcus species NOT DETECTED NOT DETECTED   Streptococcus agalactiae NOT DETECTED NOT DETECTED   Streptococcus pneumoniae NOT DETECTED NOT DETECTED   Streptococcus pyogenes NOT DETECTED NOT DETECTED   A.calcoaceticus-baumannii NOT DETECTED NOT DETECTED   Bacteroides fragilis NOT DETECTED NOT DETECTED   Enterobacterales NOT DETECTED NOT DETECTED   Enterobacter cloacae complex NOT DETECTED NOT DETECTED   Escherichia coli NOT DETECTED NOT DETECTED   Klebsiella aerogenes NOT DETECTED NOT DETECTED   Klebsiella oxytoca NOT DETECTED NOT DETECTED   Klebsiella pneumoniae NOT DETECTED NOT DETECTED   Proteus species NOT DETECTED NOT DETECTED   Salmonella species NOT DETECTED NOT DETECTED   Serratia marcescens NOT DETECTED NOT DETECTED   Haemophilus influenzae NOT DETECTED NOT DETECTED   Neisseria meningitidis NOT DETECTED NOT DETECTED   Pseudomonas aeruginosa NOT DETECTED NOT DETECTED   Stenotrophomonas maltophilia NOT DETECTED NOT DETECTED   Candida albicans NOT DETECTED NOT DETECTED   Candida auris NOT DETECTED NOT DETECTED   Candida glabrata NOT DETECTED NOT DETECTED   Candida krusei NOT DETECTED NOT DETECTED   Candida parapsilosis NOT DETECTED NOT DETECTED   Candida tropicalis NOT DETECTED NOT DETECTED   Cryptococcus neoformans/gattii NOT DETECTED NOT DETECTED   Meth resistant mecA/C  and MREJ DETECTED (A) NOT DETECTED    Comment: CRITICAL RESULT CALLED TO, READ BACK BY AND VERIFIED WITH: TREY GREENWOOD 08/30/23 1230 MW Performed at [facility], [address] [place], [place] [address]   Basic metabolic panel     Status: Abnormal   Collection Time: 08/30/23  4:05 AM  Result Value Ref Range   Sodium 138 135 - 145 mmol/L   Potassium 4.7 3.5 - 5.1 mmol/L   Chloride 94 (L) 98 - 111 mmol/L   CO2 31 22 - 32 mmol/L   Glucose, Bld 94 70 - 99 mg/dL    Comment: Glucose reference range applies only to samples taken after fasting for at least 8 hours.   BUN 47 (H) 8  - 23 mg/dL   Creatinine, Ser 60.45 (H) 0.61 - 1.24 mg/dL   Calcium  8.1 (L) 8.9 - 10.3 mg/dL   GFR, Estimated 5 (L) >60 mL/min    Comment: (NOTE) Calculated using the CKD-EPI Creatinine Equation (2021)    Anion gap 13 5 - 15    Comment: Performed at [facility], [address] [place], [place] [address]  CBC     Status: Abnormal   Collection Time: 08/30/23  4:05 AM  Result Value Ref Range   WBC 4.3 4.0 - 10.5 K/uL   RBC 2.51 (L) 4.22 - 5.81 MIL/uL   Hemoglobin 7.0 (L) 13.0 - 17.0 g/dL   HCT 19.1 (L) 47.8 - 29.5 %   MCV 94.4 80.0 - 100.0 fL   MCH 27.9 26.0 - 34.0 pg   MCHC 29.5 (L) 30.0 - 36.0 g/dL   RDW 62.1 (H) 30.8 - 65.7 %   Platelets 99 (L) 150 - 400 K/uL    Comment: REPEATED TO VERIFY   nRBC 0.0 0.0 - 0.2 %    Comment: Performed at [facility], [address] [place], [place] [address]  Hepatitis B surface antigen     Status: None   Collection Time: 08/30/23  4:05 AM  Result Value Ref Range   Hepatitis B Surface Ag NON REACTIVE NON REACTIVE    Comment: Performed at [facility], [address], [place], [place] [address]  Prepare RBC (crossmatch)     Status: None   Collection Time: 08/30/23  4:28 AM  Result Value Ref Range   Order Confirmation      ORDER PROCESSED BY BLOOD BANK Performed at [facility], [address] [place], [place] [address]   Type and screen [facility]     Status: None (Preliminary result)   Collection Time: 08/30/23  4:50 AM  Result Value Ref Range   ABO/RH(D) A POS    Antibody Screen NEG    Sample Expiration 09/02/2023,2359    Unit Number [id number]    Blood Component Type RED CELLS,LR    Unit division 00    Status of Unit ALLOCATED    Transfusion Status OK TO TRANSFUSE    Crossmatch Result Compatible    Unit Number [id number]    Blood Component Type RED CELLS,LR    Unit division 00    Status of Unit ISSUED    Transfusion Status OK TO TRANSFUSE    Crossmatch Result       Compatible Performed at [facility], [address], , [place] [address]   Prepare RBC (crossmatch)     Status: None   Collection Time: 08/30/23 10:08 AM  Result Value Ref Range   Order Confirmation  DUPLICATE REQUEST Performed at [facility], [address] [place], [place] [address]    No components found for: ESR, C REACTIVE PROTEIN MICRO: Recent Results (from the past 720 hours)  Culture, Respiratory w Gram Stain     Status: None   Collection Time: 08/07/23  8:09 AM   Specimen: Tracheal Aspirate; Respiratory  Result Value Ref Range Status   Specimen Description   Final    TRACHEAL ASPIRATE Performed at [facility], [address], [place], [place] [address]    Special Requests   Final    NONE Performed at [facility], [address] [place], [place] [address]    Gram Stain   Final    FEW WBC PRESENT, PREDOMINANTLY MONONUCLEAR FEW GRAM POSITIVE COCCI IN CLUSTERS Performed at [facility], [address] [place], [place] [address]    Culture FEW METHICILLIN RESISTANT STAPHYLOCOCCUS AUREUS  Final   Report Status 08/10/2023 FINAL  Final   Organism ID, Bacteria METHICILLIN RESISTANT STAPHYLOCOCCUS AUREUS  Final      Susceptibility   Methicillin resistant staphylococcus aureus - MIC*    CIPROFLOXACIN >=8 RESISTANT Resistant     ERYTHROMYCIN >=8 RESISTANT Resistant     GENTAMICIN <=0.5 SENSITIVE Sensitive     OXACILLIN >=4 RESISTANT Resistant     TETRACYCLINE >=16 RESISTANT Resistant     VANCOMYCIN  1 SENSITIVE Sensitive     TRIMETH /SULFA  <=10 SENSITIVE Sensitive     CLINDAMYCIN >=8 RESISTANT Resistant     RIFAMPIN <=0.5 SENSITIVE Sensitive     Inducible Clindamycin NEGATIVE Sensitive     LINEZOLID  2 SENSITIVE Sensitive     * FEW METHICILLIN RESISTANT STAPHYLOCOCCUS AUREUS  Resp panel by RT-PCR (RSV, Flu A&B, Covid) Anterior Nasal Swab     Status: None   Collection Time: 08/29/23  8:00 PM   Specimen:  Anterior Nasal Swab  Result Value Ref Range Status   SARS Coronavirus 2 by RT PCR NEGATIVE NEGATIVE Final    Comment: (NOTE) SARS-CoV-2 target nucleic acids are NOT DETECTED.  The SARS-CoV-2 RNA is generally detectable in upper respiratory specimens during the acute phase of infection. The lowest concentration of SARS-CoV-2 viral copies this assay can detect is 138 copies/mL. A negative result does not preclude SARS-Cov-2 infection and should not be used as the sole basis for treatment or other patient management decisions. A negative result may occur with  improper specimen collection/handling, submission of specimen other than nasopharyngeal swab, presence of viral mutation(s) within the areas targeted by this assay, and inadequate number of viral copies(<138 copies/mL). A negative result must be combined with clinical observations, patient history, and epidemiological information. The expected result is Negative.  Fact Sheet for Patients:  BloggerCourse.com  Fact Sheet for Healthcare Providers:  SeriousBroker.it  This test is no t yet approved or cleared by the [place]  FDA and  has been authorized for detection and/or diagnosis of SARS-CoV-2 by FDA under an Emergency Use Authorization (EUA). This EUA will remain  in effect (meaning this test can be used) for the duration of the COVID-19 declaration under Section 564(b)(1) of the Act, 21 U.S.C.section 360bbb-3(b)(1), unless the authorization is terminated  or revoked sooner.       Influenza A by PCR NEGATIVE NEGATIVE Final   Influenza B by PCR NEGATIVE NEGATIVE Final    Comment: (NOTE) The Xpert Xpress SARS-CoV-2/FLU/RSV plus assay is intended as an aid in the diagnosis of influenza from Nasopharyngeal swab specimens and should not be used as  a sole basis for treatment. Nasal washings and aspirates are unacceptable for Xpert Xpress SARS-CoV-2/FLU/RSV testing.  Fact  Sheet for Patients: BloggerCourse.com  Fact Sheet for Healthcare Providers: SeriousBroker.it  This test is not yet approved or cleared by the [place]  FDA and has been authorized for detection and/or diagnosis of SARS-CoV-2 by FDA under an Emergency Use Authorization (EUA). This EUA will remain in effect (meaning this test can be used) for the duration of the COVID-19 declaration under Section 564(b)(1) of the Act, 21 U.S.C. section 360bbb-3(b)(1), unless the authorization is terminated or revoked.     Resp Syncytial Virus by PCR NEGATIVE NEGATIVE Final    Comment: (NOTE) Fact Sheet for Patients: BloggerCourse.com  Fact Sheet for Healthcare Providers: SeriousBroker.it  This test is not yet approved or cleared by the [place]  FDA and has been authorized for detection and/or diagnosis of SARS-CoV-2 by FDA under an Emergency Use Authorization (EUA). This EUA will remain in effect (meaning this test can be used) for the duration of the COVID-19 declaration under Section 564(b)(1) of the Act, 21 U.S.C. section 360bbb-3(b)(1), unless the authorization is terminated or revoked.  Performed at [facility], [address] [place], [place] [address]   Blood Culture (routine x 2)     Status: None (Preliminary result)   Collection Time: 08/30/23 12:06 AM   Specimen: BLOOD RIGHT HAND  Result Value Ref Range Status   Specimen Description BLOOD RIGHT HAND  Final   Special Requests   Final    BOTTLES DRAWN AEROBIC AND ANAEROBIC Blood Culture results may not be optimal due to an inadequate volume of blood received in culture bottles   Culture  Setup Time   Final    GRAM POSITIVE COCCI IN BOTH AEROBIC AND ANAEROBIC BOTTLES CRITICAL RESULT CALLED TO, READ BACK BY AND VERIFIED WITH: TREY GREENWOOD 08/30/23 1230 MW Performed at [facility], [address]  [place], [place] [address]    Culture PENDING  Incomplete   Report Status PENDING  Incomplete  Blood Culture (routine x 2)     Status: None (Preliminary result)   Collection Time: 08/30/23 12:06 AM   Specimen: BLOOD RIGHT ARM  Result Value Ref Range Status   Specimen Description BLOOD RIGHT ARM  Final   Special Requests   Final    BOTTLES DRAWN AEROBIC AND ANAEROBIC Blood Culture results may not be optimal due to an inadequate volume of blood received in culture bottles   Culture  Setup Time   Final    GRAM POSITIVE COCCI IN BOTH AEROBIC AND ANAEROBIC BOTTLES Organism ID to follow CRITICAL RESULT CALLED TO, READ BACK BY AND VERIFIED WITH: TREY GREENWOOD 08/30/23 1230 MW Performed at [facility], [address] [place], [place] [address]    Culture PENDING  Incomplete   Report Status PENDING  Incomplete  Blood Culture ID Panel (Reflexed)     Status: Abnormal   Collection Time: 08/30/23 12:06 AM  Result Value Ref Range Status   Enterococcus faecalis NOT DETECTED NOT DETECTED Final   Enterococcus Faecium NOT DETECTED NOT DETECTED Final   Listeria monocytogenes NOT DETECTED NOT DETECTED Final   Staphylococcus species DETECTED (A) NOT DETECTED Final    Comment: CRITICAL RESULT CALLED TO, READ BACK BY AND VERIFIED WITH: TREY GREENWOOD 08/30/23 1230 MW    Staphylococcus aureus (BCID) DETECTED (A) NOT DETECTED Final    Comment: Methicillin (oxacillin)-resistant Staphylococcus aureus (MRSA). MRSA is predictably resistant to beta-lactam antibiotics (except ceftaroline). Preferred therapy is  vancomycin  unless clinically contraindicated. Patient requires contact precautions if  hospitalized. CRITICAL RESULT CALLED TO, READ BACK BY AND VERIFIED WITH: TREY GREENWOOD 08/30/23 1230 MW    Staphylococcus epidermidis NOT DETECTED NOT DETECTED Final   Staphylococcus lugdunensis NOT DETECTED NOT DETECTED Final   Streptococcus species NOT DETECTED NOT DETECTED Final   Streptococcus agalactiae NOT  DETECTED NOT DETECTED Final   Streptococcus pneumoniae NOT DETECTED NOT DETECTED Final   Streptococcus pyogenes NOT DETECTED NOT DETECTED Final   A.calcoaceticus-baumannii NOT DETECTED NOT DETECTED Final   Bacteroides fragilis NOT DETECTED NOT DETECTED Final   Enterobacterales NOT DETECTED NOT DETECTED Final   Enterobacter cloacae complex NOT DETECTED NOT DETECTED Final   Escherichia coli NOT DETECTED NOT DETECTED Final   Klebsiella aerogenes NOT DETECTED NOT DETECTED Final   Klebsiella oxytoca NOT DETECTED NOT DETECTED Final   Klebsiella pneumoniae NOT DETECTED NOT DETECTED Final   Proteus species NOT DETECTED NOT DETECTED Final   Salmonella species NOT DETECTED NOT DETECTED Final   Serratia marcescens NOT DETECTED NOT DETECTED Final   Haemophilus influenzae NOT DETECTED NOT DETECTED Final   Neisseria meningitidis NOT DETECTED NOT DETECTED Final   Pseudomonas aeruginosa NOT DETECTED NOT DETECTED Final   Stenotrophomonas maltophilia NOT DETECTED NOT DETECTED Final   Candida albicans NOT DETECTED NOT DETECTED Final   Candida auris NOT DETECTED NOT DETECTED Final   Candida glabrata NOT DETECTED NOT DETECTED Final   Candida krusei NOT DETECTED NOT DETECTED Final   Candida parapsilosis NOT DETECTED NOT DETECTED Final   Candida tropicalis NOT DETECTED NOT DETECTED Final   Cryptococcus neoformans/gattii NOT DETECTED NOT DETECTED Final   Meth resistant mecA/C and MREJ DETECTED (A) NOT DETECTED Final    Comment: CRITICAL RESULT CALLED TO, READ BACK BY AND VERIFIED WITH: TREY GREENWOOD 08/30/23 1230 MW Performed at [facility], [address] [place], [place] [address]     IMAGING: CT Chest Wo Contrast Result Date: 08/29/2023 CLINICAL DATA:  Respiratory illness, nondiagnostic xray recent hemothorax, new respiratory symptoms, short of breath, recent history of sepsis EXAM: CT CHEST WITHOUT CONTRAST TECHNIQUE: Multidetector CT imaging of the chest was performed following the  standard protocol without IV contrast. RADIATION DOSE REDUCTION: This exam was performed according to the departmental dose-optimization program which includes automated exposure control, adjustment of the mA and/or kV according to patient size and/or use of iterative reconstruction technique. COMPARISON:  08/11/2023, 08/05/2023 FINDINGS: Cardiovascular: Unenhanced imaging of the heart demonstrates stable cardiomegaly without pericardial effusion. Normal caliber of the thoracic aorta. Atherosclerosis of the aorta and coronary vasculature. Mediastinum/Nodes: Borderline enlarged mediastinal lymph nodes, largest measuring 10 mm in the right paratracheal region. Thyroid , trachea, and esophagus are unremarkable. Lungs/Pleura: Scattered nodular ground-glass airspace disease is seen bilaterally, greatest within the lower lobes. Findings are consistent with infection, aspiration, or hemorrhage. No effusion or pneumothorax. The central airways are patent. Upper Abdomen: Central venous catheter from an inferior approach, tip at the atriocaval junction. No acute upper abdominal findings. Musculoskeletal: No acute or destructive bony abnormalities. Reconstructed images demonstrate no additional findings. IMPRESSION: 1. Scattered bilateral nodular ground-glass airspace disease, greatest in the lower lobes, favor bilateral pneumonia given clinical presentation. 2. Cardiomegaly. 3. Borderline mediastinal adenopathy, likely reactive. 4. Aortic Atherosclerosis (ICD10-I70.0). Coronary artery atherosclerosis. Electronically Signed   By: Bobbye Burrow M.D.   On: 08/29/2023 20:38   PERIPHERAL VASCULAR CATHETERIZATION Result Date: 08/12/2023 See surgical note for result.  DG Chest Port 1 View Result Date: 08/11/2023 CLINICAL DATA:  Hemodialysis fatigue EXAM: PORTABLE  CHEST 1 VIEW COMPARISON:  08/10/2023 FINDINGS: Cardiomegaly. No acute airspace disease, pleural effusion or pneumothorax. Minimal atelectasis left base. IMPRESSION:  No active disease. Cardiomegaly. Electronically Signed   By: Esmeralda Hedge M.D.   On: 08/11/2023 18:59   DG Chest Port 1 View Result Date: 08/10/2023 CLINICAL DATA:  Pleural effusion. EXAM: PORTABLE CHEST 1 VIEW COMPARISON:  08/09/2023 FINDINGS: The cardio pericardial silhouette is enlarged. Basilar atelectasis bilaterally with improved aeration in the retrocardiac left base compared to prior. No acute bony abnormality. Telemetry leads overlie the chest. Trace soft tissue gas seen in the right supraclavicular region previously has resolved. IMPRESSION: Basilar atelectasis bilaterally with improved aeration in the retrocardiac left base compared to prior. Electronically Signed   By: Donnal Fusi M.D.   On: 08/10/2023 07:34   ECHOCARDIOGRAM COMPLETE Result Date: 08/09/2023    ECHOCARDIOGRAM REPORT   Patient Name:   METRO EDENFIELD Date of Exam: 08/09/2023 Medical Rec #:  [id number]           Height:       72.0 in Accession #:    [id number]          Weight:       239.9 lb Date of Birth:  08-15-1960           BSA:          2.302 m Patient Age:    63 years            BP:           95/54 mmHg Patient Gender: M                   HR:           57 bpm. Exam Location:  [facility] Procedure: 2D Echo, Color Doppler and Cardiac Doppler (Both Spectral and Color            Flow Doppler were utilized during procedure). Indications:     R57.9 Shock  History:         Patient has prior history of Echocardiogram examinations. ESRD,                  Arrythmias:Atrial Fibrillation; Risk Factors:Diabetes,                  Hypertension and Dyslipidemia.  Sonographer:     L. Thornton-Maynard Referring Phys:  [id number] Delanna Fears Diagnosing Phys: Jackquelyn Mass MD IMPRESSIONS  1. Left ventricular ejection fraction, by estimation, is 55 to 60%. The left ventricle has normal function. The left ventricle has no regional wall motion abnormalities. There is severe concentric left ventricular hypertrophy. Left ventricular diastolic  function  could not be evaluated.  2. Right ventricular systolic function is moderately reduced. The right ventricular size is moderately enlarged. There is mildly elevated pulmonary artery systolic pressure. The estimated right ventricular systolic pressure is 41.4 mmHg.  3. Left atrial size was severely dilated.  4. Right atrial size was moderately dilated.  5. The mitral valve is grossly normal. Trivial mitral valve regurgitation. No evidence of mitral stenosis.  6. The aortic valve is tricuspid. Aortic valve regurgitation is not visualized. Aortic valve sclerosis/calcification is present, without any evidence of aortic stenosis.  7. The inferior vena cava is normal in size with greater than 50% respiratory variability, suggesting right atrial pressure of 3 mmHg. Comparison(s): Changes from prior study are noted. Severe concentric LVH up to 2.0 cm. LVEF is normal and improved from prior. Moderate RV  dilation with moderately reduced function. Severe LVH may be related to hypertensive heart disease per chart review, but would consider work-up for infiltrative cardiomyopathy such as cardiac amyloidosis. FINDINGS  Left Ventricle: Left ventricular ejection fraction, by estimation, is 55 to 60%. The left ventricle has normal function. The left ventricle has no regional wall motion abnormalities. The left ventricular internal cavity size was normal in size. There is  severe concentric left ventricular hypertrophy. Left ventricular diastolic function could not be evaluated due to atrial fibrillation. Left ventricular diastolic function could not be evaluated. Right Ventricle: The right ventricular size is moderately enlarged. No increase in right ventricular wall thickness. Right ventricular systolic function is moderately reduced. There is mildly elevated pulmonary artery systolic pressure. The tricuspid regurgitant velocity is 3.10 m/s, and with an assumed right atrial pressure of 3 mmHg, the estimated right ventricular systolic  pressure is 41.4 mmHg. Left Atrium: Left atrial size was severely dilated. Right Atrium: Right atrial size was moderately dilated. Pericardium: Trivial pericardial effusion is present. Mitral Valve: The mitral valve is grossly normal. Trivial mitral valve regurgitation. No evidence of mitral valve stenosis. Tricuspid Valve: The tricuspid valve is grossly normal. Tricuspid valve regurgitation is mild . No evidence of tricuspid stenosis. Aortic Valve: The aortic valve is tricuspid. Aortic valve regurgitation is not visualized. Aortic valve sclerosis/calcification is present, without any evidence of aortic stenosis. Aortic valve mean gradient measures 4.7 mmHg. Aortic valve peak gradient measures 9.7 mmHg. Aortic valve area, by VTI measures 1.92 cm. Pulmonic Valve: The pulmonic valve was grossly normal. Pulmonic valve regurgitation is not visualized. No evidence of pulmonic stenosis. Aorta: The aortic root and ascending aorta are structurally normal, with no evidence of dilitation. Venous: The inferior vena cava is normal in size with greater than 50% respiratory variability, suggesting right atrial pressure of 3 mmHg. IAS/Shunts: The atrial septum is grossly normal.  LEFT VENTRICLE PLAX 2D LVIDd:         4.00 cm     Diastology LVIDs:         3.00 cm     LV e' medial:    5.19 cm/s LV PW:         2.00 cm     LV E/e' medial:  13.0 LV IVS:        2.00 cm     LV e' lateral:   5.43 cm/s LVOT diam:     2.20 cm     LV E/e' lateral: 12.4 LV SV:         41 LV SV Index:   18 LVOT Area:     3.80 cm  LV Volumes (MOD) LV vol d, MOD A2C: 57.9 ml LV vol d, MOD A4C: 62.5 ml LV vol s, MOD A2C: 21.8 ml LV vol s, MOD A4C: 19.0 ml LV SV MOD A2C:     36.1 ml LV SV MOD A4C:     62.5 ml LV SV MOD BP:      39.9 ml RIGHT VENTRICLE             IVC RV Basal diam:  4.00 cm     IVC diam: 1.30 cm RV S prime:     10.60 cm/s TAPSE (M-mode): 1.0 cm LEFT ATRIUM              Index        RIGHT ATRIUM           Index LA diam:        4.80  cm  2.09 cm/m    RA Area:     25.80 cm LA Vol (A2C):   147.0 ml 63.87 ml/m  RA Volume:   87.30 ml  37.93 ml/m LA Vol (A4C):   113.0 ml 49.10 ml/m LA Biplane Vol: 131.0 ml 56.92 ml/m  AORTIC VALVE                    PULMONIC VALVE AV Area (Vmax):    1.95 cm     PV Vmax:       1.19 m/s AV Area (Vmean):   2.03 cm     PV Peak grad:  5.7 mmHg AV Area (VTI):     1.92 cm AV Vmax:           155.33 cm/s AV Vmean:          97.967 cm/s AV VTI:            0.214 m AV Peak Grad:      9.7 mmHg AV Mean Grad:      4.7 mmHg LVOT Vmax:         79.80 cm/s LVOT Vmean:        52.300 cm/s LVOT VTI:          0.108 m LVOT/AV VTI ratio: 0.50  AORTA Ao Root diam: 3.40 cm Ao Asc diam:  3.20 cm MITRAL VALVE               TRICUSPID VALVE MV Area (PHT): 3.06 cm    TR Peak grad:   38.4 mmHg MV Decel Time: 248 msec    TR Vmax:        310.00 cm/s MV E velocity: 67.30 cm/s                            SHUNTS                            Systemic VTI:  0.11 m                            Systemic Diam: 2.20 cm Jackquelyn Mass MD Electronically signed by Jackquelyn Mass MD Signature Date/Time: 08/09/2023/2:02:31 PM    Final    DG Chest Port 1 View Result Date: 08/09/2023 CLINICAL DATA:  End-stage renal disease. EXAM: PORTABLE CHEST 1 VIEW COMPARISON:  08/08/2023 FINDINGS: The cardio pericardial silhouette is enlarged. Left base collapse/consolidation appears progressive in the interval. No overt pulmonary edema or substantial pleural effusion. No acute bony abnormality. Telemetry leads overlie the chest. Trace soft tissue gas again seen in the right supraclavicular region. IMPRESSION: Progressive left base collapse/consolidation. No evidence for pneumothorax with persistent minimal soft tissue gas in the right supraclavicular region. Electronically Signed   By: Donnal Fusi M.D.   On: 08/09/2023 09:16   DG Chest Port 1 View Result Date: 08/08/2023 CLINICAL DATA:  Chest tube removal. EXAM: PORTABLE CHEST 1 VIEW COMPARISON:  08/08/2023 at 5:53 a.m. FINDINGS:  Interval removal of nasogastric tube, enteric tube and right-sided chest tube. A catheter remains in place with tip projecting over the right atrium unchanged. Lungs are adequately inflated and otherwise clear. No evidence of right-sided pneumothorax. Cardiomediastinal silhouette and remainder of the exam is unchanged. IMPRESSION: Interval removal of right-sided chest tube without evidence of right-sided pneumothorax. Electronically Signed   By: Roda Cirri M.D.  On: 08/08/2023 11:14   DG Chest Port 1 View Result Date: 08/08/2023 CLINICAL DATA:  Chest tube in place EXAM: PORTABLE CHEST 1 VIEW COMPARISON:  Yesterday FINDINGS: Endotracheal tube with tip at the clavicular heads. An enteric tube at least reaches the stomach. Central line from below in stable position. Right chest tube in place with no visible pneumothorax or hemothorax. Cardiac enlargement. IMPRESSION: Stable hardware positioning and normal aeration. Electronically Signed   By: Ronnette Coke M.D.   On: 08/08/2023 08:56   DG Chest Port 1 View Result Date: 08/07/2023 CLINICAL DATA:  Respiratory failure. EXAM: PORTABLE CHEST 1 VIEW COMPARISON:  08/07/2023, 6:52 a.m. FINDINGS: The heart size and mediastinal contours are within normal limits. Minimal subsegmental atelectasis or scarring at the left base. Normal pulmonary vasculature. No pneumothorax or pleural effusion. Aorta is calcified. The visualized skeletal structures are unremarkable. Endotracheal tube tip at the thoracic inlet. Right-sided chest tube tip in the midline. NG tube tip superimposed with stomach with the side port superimposed with distal esophagus. NG tube should be advanced about 8 cm. IMPRESSION: No active disease. Electronically Signed   By: Sydell Eva M.D.   On: 08/07/2023 22:47   DG Chest 1 View Result Date: 08/07/2023 CLINICAL DATA:  Hemothorax EXAM: CHEST  1 VIEW COMPARISON:  Yesterday FINDINGS: Endotracheal tube with tip at the clavicular heads. An enteric  tube at least reaches the diaphragm. Right chest tube. Central line from below with tip at the lower right atrium. Cardiac enlargement. There is no edema, consolidation, effusion, or pneumothorax. IMPRESSION: No active disease.  Stable hardware positioning. Electronically Signed   By: Ronnette Coke M.D.   On: 08/07/2023 07:16   DG Chest Port 1 View Result Date: 08/06/2023 CLINICAL DATA:  Status post chest tube placement EXAM: PORTABLE CHEST 1 VIEW COMPARISON:  Chest radiograph dated 08/06/2023 FINDINGS: Lines/tubes: Endotracheal tube tip projects 4.8 cm above the carina. Gastric/enteric tube tip projects over the stomach. Inferior approach central venous catheter tip projects over the lower right atrium. Interval placement of medial right pleural catheter. Lungs: Improved right lung aeration. Patchy left retrocardiac opacity. Rounded right hilar opacity. Pleura: Markedly decreased right pleural effusion. Small pleural effusion remains. No definite pneumothorax. Heart/mediastinum: Enlarged cardiomediastinal silhouette. Bones: No acute osseous abnormality. Trace subcutaneous emphysema overlying the right lateral chest wall. IMPRESSION: 1. Markedly decreased right pleural effusion status post medial right pleural catheter placement. Small pleural effusion remains. No definite pneumothorax. 2. Improved right lung aeration. 3. Rounded right hilar opacity, which may represent a pulmonary mass or lymphadenopathy. Recommend attention on follow-up. 4. Patchy left retrocardiac opacity, which may represent atelectasis or pneumonia. 5. Cardiomegaly. Electronically Signed   By: Limin  Xu M.D.   On: 08/06/2023 16:48   DG Chest Port 1 View Result Date: 08/06/2023 CLINICAL DATA:  Follow-up pleural effusion EXAM: PORTABLE CHEST 1 VIEW COMPARISON:  Chest CT from the previous day. FINDINGS: Cardiac shadow is stable. Endotracheal tube and gastric catheter are noted in satisfactory position. Right-sided pleural effusion is again  identified and stable similar to that seen on the prior exam. No pneumothorax is seen. Left lung remains clear. IMPRESSION: Large right pleural effusion. Tubes and lines as described. Electronically Signed   By: Violeta Grey M.D.   On: 08/06/2023 03:42   CT CHEST WO CONTRAST Result Date: 08/05/2023 CLINICAL DATA:  Status post PermCath placement complicated by bleeding EXAM: CT CHEST WITHOUT CONTRAST TECHNIQUE: Multidetector CT imaging of the chest was performed following the standard protocol without IV contrast.  RADIATION DOSE REDUCTION: This exam was performed according to the departmental dose-optimization program which includes automated exposure control, adjustment of the mA and/or kV according to patient size and/or use of iterative reconstruction technique. COMPARISON:  Same day chest radiograph FINDINGS: Cardiovascular: Multichamber cardiomegaly. Inferior approach central venous catheter tip terminates at the inferior cavoatrial junction. No significant pericardial fluid/thickening. Great vessels are normal in course and caliber. Linear hyperdensity within the right brachiocephalic vein (5:47), which may be postprocedural. Multiple linear densities extending from the posterior aspect of the lower right brachiocephalic vein, for example series 3, image 43, 49. Mediastinum/Nodes: Mediastinum is deviated into the left hemidiaphragm. Imaged thyroid  gland without nodules meeting criteria for imaging follow-up by size. Normal esophagus. No pathologically enlarged axillary, supraclavicular, mediastinal, or hilar lymph nodes. Lungs/Pleura: ETT terminates 4.3 cm above the carina. The central airways are patent. Complete right lower lobe atelectasis. Subsegmental atelectasis of the right upper and middle lobes. Irregular consolidation in the left lower lobe. No focal consolidation. Trace right pneumothorax. Large right pleural effusion demonstrates increased attenuation. Upper abdomen: Enteric tube terminates along  the lateral wall of the gastric fundus. Partially imaged multifocal bilateral renal hypodensities, many of which are subcentimeter. Musculoskeletal: No acute or abnormal lytic or blastic osseous lesions. Multilevel degenerative changes of the partially imaged thoracic and lumbar spine. Subcutaneous soft tissue stranding in the right upper chest. IMPRESSION: 1. Large right pleural effusion demonstrates increased attenuation, suspicious for hemothorax. Trace right pneumothorax. 2. Complete right lower lobe atelectasis. Subsegmental atelectasis of the right upper and middle lobes. 3. Irregular consolidation in the left lower lobe, likely atelectasis. Superimposed aspiration or pneumonia is not excluded. 4. Linear hyperdensity within the right brachiocephalic vein, which may be postprocedural. Multiple linear densities extending from the posterior aspect of the lower right brachiocephalic vein, which may reflect postprocedural changes. 5. Multichamber cardiomegaly. 6. Subcutaneous soft tissue stranding in the right upper chest, likely postprocedural. Aortic Atherosclerosis (ICD10-I70.0). Coronary artery calcifications. Assessment for potential risk factor modification, dietary therapy or pharmacologic therapy may be warranted, if clinically indicated. Electronically Signed   By: Limin  Xu M.D.   On: 08/05/2023 19:32   CT HEAD WO CONTRAST ( ) Result Date: 08/05/2023 CLINICAL DATA:  Mental status change, unknown cause. EXAM: CT HEAD WITHOUT CONTRAST TECHNIQUE: Contiguous axial images were obtained from the base of the skull through the vertex without intravenous contrast. RADIATION DOSE REDUCTION: This exam was performed according to the departmental dose-optimization program which includes automated exposure control, adjustment of the mA and/or kV according to patient size and/or use of iterative reconstruction technique. COMPARISON:  Head CT and MRI 03/10/2022 FINDINGS: Brain: There is no evidence of an acute  infarct, intracranial hemorrhage, mass, midline shift, or extra-axial fluid collection. A chronic infarct or other remote insult is again noted in the right cerebellar hemisphere. Mild calcification is noted in the cerebellar vermis and left cerebellar hemisphere. There are chronic lacunar infarcts in the left basal ganglia, and there are unchanged small chronic left frontal, parietal, and occipital cortical infarcts. No age advanced generalized cerebral atrophy is evident. The ventricles are normal in size. There is a background of mild chronic small vessel ischemia in the cerebral white matter. Vascular: Calcified atherosclerosis at the skull base. No hyperdense vessel. Skull: No acute fracture or suspicious lesion. Sinuses/Orbits: Visualized paranasal sinuses and mastoid air cells are clear. Unremarkable orbits. Other: None. IMPRESSION: 1. No evidence of acute intracranial abnormality. 2. Chronic ischemia as above. Electronically Signed   By: Constantine Delude.D.  On: 08/05/2023 18:25   DG Abd 1 View Result Date: 08/05/2023 CLINICAL DATA:  Orogastric tube placement. EXAM: ABDOMEN - 1 VIEW COMPARISON:  Portable chest radiograph obtained earlier today. FINDINGS: Interval orogastric tube with its tip in the proximal to mid stomach. The endotracheal tube remains in satisfactory position. Stable diffuse right lung opacity and clear left lung. The included portion of the bowel is unremarkable. Extensive thoracic spine degenerative changes with changes of DISH. IMPRESSION: 1. Satisfactory position of the orogastric tube. 2. Stable large right pleural effusion. Electronically Signed   By: Catherin Closs M.D.   On: 08/05/2023 16:35   DG Chest Port 1 View Result Date: 08/05/2023 CLINICAL DATA:  Status post intubation EXAM: PORTABLE CHEST 1 VIEW COMPARISON:  Chest radiograph dated 04/13/2021 FINDINGS: Lines/tubes: Endotracheal tube tip projects 5.6 cm above the carina. Lungs: New opacification of the right lung. Linear  left retrocardiac opacities. Pleura: Large right pleural effusion.  No pneumothorax. Heart/mediastinum: Left upper porta is obscured. Bones: No acute osseous abnormality. IMPRESSION: 1. Endotracheal tube tip projects 5.6 cm above the carina. 2. New opacification of the right lung, likely a combination of atelectasis and large pleural effusion. 3. Linear left retrocardiac opacities, likely atelectasis. Electronically Signed   By: Limin  Xu M.D.   On: 08/05/2023 14:46   DG C-Arm 1-60 Min-No Report Result Date: 08/05/2023 Fluoroscopy was utilized by the requesting physician.  No radiographic interpretation.   DG C-Arm 1-60 Min-No Report Result Date: 08/05/2023 Fluoroscopy was utilized by the requesting physician.  No radiographic interpretation.   DG C-Arm 1-60 Min-No Report Result Date: 08/05/2023 Fluoroscopy was utilized by the requesting physician.  No radiographic interpretation.   US  OR NERVE BLOCK-IMAGE ONLY [facility]) Result Date: 08/05/2023 There is no interpretation for this exam.  This order is for images obtained during a surgical procedure.  Please See Surgeries Tab for more information regarding the procedure.    Assessment:   Demarie Hyneman is a 63 y.o. male with ESRD on HD through R groin permacath, s.p recent hospitalization with L AVF revision admitted with SOB and found to have bil Ground glass nodular disease and MRSA bacteremia   Recommendations MRSA bacteremia- has a permacath R groin. Has some purulence at prior AVF site and L ant shin which I have cultured. Could be source of infection Will cont vanco Repeat bcx Check echo Assess AVF site for deeper infection Will need HD cath removed.  Possible PNA - mainly CT with mild nodular disease.  so far wu neg. Will await strep PNA and urine leg ag and sputum cx.  L shin wound- consult wound care   Thank you very much for allowing me to participate in the care of this patient. Please call with questions.   Merri Abbe.  Harwood Lingo, MD

## 2023-08-31 ENCOUNTER — Inpatient Hospital Stay: Admitting: Nurse Practitioner

## 2023-08-31 ENCOUNTER — Encounter: Payer: Self-pay | Admitting: Vascular Surgery

## 2023-08-31 ENCOUNTER — Encounter
Admission: EM | Disposition: A | Discharge status: Home Health | Payer: Self-pay | Source: Home / Self Care | Attending: Internal Medicine

## 2023-08-31 DIAGNOSIS — I5020 Unspecified systolic (congestive) heart failure: Secondary | ICD-10-CM

## 2023-08-31 DIAGNOSIS — Z79899 Other long term (current) drug therapy: Secondary | ICD-10-CM

## 2023-08-31 DIAGNOSIS — Z7901 Long term (current) use of anticoagulants: Secondary | ICD-10-CM

## 2023-08-31 DIAGNOSIS — N186 End stage renal disease: Secondary | ICD-10-CM

## 2023-08-31 DIAGNOSIS — N2581 Secondary hyperparathyroidism of renal origin: Secondary | ICD-10-CM

## 2023-08-31 DIAGNOSIS — R7881 Bacteremia: Secondary | ICD-10-CM

## 2023-08-31 DIAGNOSIS — E669 Obesity, unspecified: Secondary | ICD-10-CM

## 2023-08-31 DIAGNOSIS — Z95828 Presence of other vascular implants and grafts: Secondary | ICD-10-CM

## 2023-08-31 DIAGNOSIS — T8249XA Other complication of vascular dialysis catheter, initial encounter: Secondary | ICD-10-CM | POA: Diagnosis not present

## 2023-08-31 DIAGNOSIS — Z7982 Long term (current) use of aspirin: Secondary | ICD-10-CM

## 2023-08-31 DIAGNOSIS — Z89611 Acquired absence of right leg above knee: Secondary | ICD-10-CM

## 2023-08-31 DIAGNOSIS — J189 Pneumonia, unspecified organism: Secondary | ICD-10-CM | POA: Diagnosis not present

## 2023-08-31 DIAGNOSIS — T827XXA Infection and inflammatory reaction due to other cardiac and vascular devices, implants and grafts, initial encounter: Principal | ICD-10-CM

## 2023-08-31 DIAGNOSIS — E1169 Type 2 diabetes mellitus with other specified complication: Secondary | ICD-10-CM

## 2023-08-31 DIAGNOSIS — Z4901 Encounter for fitting and adjustment of extracorporeal dialysis catheter: Secondary | ICD-10-CM

## 2023-08-31 DIAGNOSIS — I482 Chronic atrial fibrillation, unspecified: Secondary | ICD-10-CM

## 2023-08-31 DIAGNOSIS — I132 Hypertensive heart and chronic kidney disease with heart failure and with stage 5 chronic kidney disease, or end stage renal disease: Secondary | ICD-10-CM

## 2023-08-31 DIAGNOSIS — E1122 Type 2 diabetes mellitus with diabetic chronic kidney disease: Secondary | ICD-10-CM

## 2023-08-31 HISTORY — PX: DIALYSIS/PERMA CATHETER REMOVAL: CATH118289

## 2023-08-31 LAB — CBC
HCT: 28.5 % — ABNORMAL LOW (ref 39.0–52.0)
Hemoglobin: 8.7 g/dL — ABNORMAL LOW (ref 13.0–17.0)
MCH: 28.1 pg (ref 26.0–34.0)
MCHC: 30.5 g/dL (ref 30.0–36.0)
MCV: 91.9 fL (ref 80.0–100.0)
Platelets: 88 10*3/uL — ABNORMAL LOW (ref 150–400)
RBC: 3.1 MIL/uL — ABNORMAL LOW (ref 4.22–5.81)
RDW: 18.9 % — ABNORMAL HIGH (ref 11.5–15.5)
WBC: 5.2 10*3/uL (ref 4.0–10.5)
nRBC: 0 % (ref 0.0–0.2)

## 2023-08-31 LAB — BASIC METABOLIC PANEL WITH GFR
Anion gap: 13 (ref 5–15)
BUN: 32 mg/dL — ABNORMAL HIGH (ref 8–23)
CO2: 25 mmol/L (ref 22–32)
Calcium: 8 mg/dL — ABNORMAL LOW (ref 8.9–10.3)
Chloride: 95 mmol/L — ABNORMAL LOW (ref 98–111)
Creatinine, Ser: 7.18 mg/dL — ABNORMAL HIGH (ref 0.61–1.24)
GFR, Estimated: 8 mL/min — ABNORMAL LOW (ref >60)
Glucose, Bld: 87 mg/dL (ref 70–99)
Potassium: 4.3 mmol/L (ref 3.5–5.1)
Sodium: 133 mmol/L — ABNORMAL LOW (ref 135–145)

## 2023-08-31 LAB — HEPATITIS B SURFACE ANTIBODY, QUANTITATIVE: Hep B S AB Quant (Post): 348 m[IU]/mL

## 2023-08-31 SURGERY — DIALYSIS/PERMA CATHETER REMOVAL
Anesthesia: LOCAL | Laterality: Right

## 2023-08-31 MED ORDER — MIDAZOLAM HCL 2 MG/2ML IJ SOLN
2.0000 mg | Freq: Once | INTRAMUSCULAR | Status: AC
  Administered 2023-08-31: 2 mg via INTRAVENOUS

## 2023-08-31 MED ORDER — MIDAZOLAM HCL 2 MG/2ML IJ SOLN
INTRAMUSCULAR | Status: AC
Start: 2023-08-31 — End: 2023-08-31
  Filled 2023-08-31: qty 2

## 2023-08-31 MED ORDER — LIDOCAINE-EPINEPHRINE (PF) 1 %-1:200000 IJ SOLN
INTRAMUSCULAR | Status: DC | PRN
  Administered 2023-08-31: 20 mL

## 2023-08-31 SURGICAL SUPPLY — 2 items
FORCEPS FG STRG 5.5XNS LF DISP (INSTRUMENTS) IMPLANT
TRAY LACERAT/PLASTIC (MISCELLANEOUS) IMPLANT

## 2023-08-31 NOTE — H&P (View-Only) (Signed)
  Vein and Vascular Surgery  Daily Progress Note   Subjective  - * Day of Surgery *  Patient resting comfortably watching TV when I enter the room.  He denies pain in the forearm.  However, I was contacted by Dr. Harwood Lingo who was able to express purulent material from incisional site.  Objective Vitals:   08/31/23 0900 08/31/23 1045 08/31/23 1047 08/31/23 1456  BP: (!) 99/52 (!) 74/37 (!) 126/45 (!) 110/55  Pulse:  (!) 52 61 60  Resp: (!) 22 16  16   Temp: 99.4 F (37.4 C) 99.4 F (37.4 C)  98.7 F (37.1 C)  TempSrc: Oral Oral  Oral  SpO2: 95% 98% 98% 96%  Weight:      Height:        Intake/Output Summary (Last 24 hours) at 08/31/2023 1753 Last data filed at 08/31/2023 1457 Gross per 24 hour  Intake 350 ml  Output --  Net 350 ml    PULM  Normal effort , no use of accessory muscles CV  No JVD, RRR Abd      No distended, nontender VASC  left forearm AV access no thrill no bruit the more proximal incision does have purulent material coming from it.  Distally the aneurysmal area appears to have a large scab and I suspect that this is a full-thickness injury exposing the aneurysm contents.  Laboratory CBC    Component Value Date/Time   WBC 5.2 08/31/2023 0347   HGB 8.7 (L) 08/31/2023 0347   HGB 16.8 01/12/2023 1414   HCT 28.5 (L) 08/31/2023 0347   HCT 51.0 01/12/2023 1414   PLT 88 (L) 08/31/2023 0347   PLT 257 01/12/2023 1414    BMET    Component Value Date/Time   NA 133 (L) 08/31/2023 0347   NA 136 04/14/2023 1610   K 4.3 08/31/2023 0347   CL 95 (L) 08/31/2023 0347   CO2 25 08/31/2023 0347   GLUCOSE 87 08/31/2023 0347   BUN 32 (H) 08/31/2023 0347   BUN 18 04/14/2023 1610   CREATININE 7.18 (H) 08/31/2023 0347   CALCIUM  8.0 (L) 08/31/2023 0347   GFRNONAA 8 (L) 08/31/2023 0347    Assessment/Planning: Infected left forearm AV access: The patient 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  Specifically, there is an  Artegraft placed with 2 Viabahn stents.  These appear to be infected given the purulent material and therefore I have recommended excision.  Risks and benefits have been reviewed with the patient all questions been answered at the time of the procedure I will place a temporary femoral dialysis catheter as the patient is quite pain adverse and he will require dialysis in the next 48 hours.  The patient agrees with this plan and we will move forward tomorrow with excision of the graft placement of the temporary dialysis catheter.   Adam Howell  08/31/2023, 5:53 PM

## 2023-08-31 NOTE — Consult Note (Addendum)
 WOC Consult requested for left shin wound.  This was already performed on 6/16; refer to previous WOC consult note for assessment, and topical treatment instructions have been provided for bedside nurses to perform.  Please re-consult if further assistance is needed.  Thank-you,  Wiliam Harder MSN, RN, CWOCN, [place], CNS [phone]

## 2023-08-31 NOTE — Progress Notes (Signed)
 [facility]  ROUNDING NOTE   Subjective:   Adam Howell is a 63 y.o male with past medical history of diabetes, hypertension, CAD, Rt AKA, anemia, CHF, and ESRD on dialysis. Patient presents to vascular surgery for revision of AVF. He is being admitted for Aspiration pneumonia (HCC) [J69.0] HCAP (healthcare-associated pneumonia) [J18.9] Pneumonia of both lungs due to infectious organism, unspecified part of lung [J18.9] Acute hypoxic respiratory failure (HCC) [J96.01]  Patient presented to the hospital for shortness of breath.  He was diagnosed with pneumonia and is being admitted for further management.  Blood cultures are positive for staph species and MRSA in addition to gram-positive rods.  Femoral tunneled dialysis catheter was removed this morning.  Patient denies any shortness of breath.   Objective:  Vital signs in last 24 hours:  Temp:  [97.7 F (36.5 C)-99.4 F (37.4 C)] 99.4 F (37.4 C) (06/17 1045) Pulse Rate:  [52-98] 61 (06/17 1047) Resp:  [16-29] 16 (06/17 1045) BP: (74-126)/(37-88) 126/45 (06/17 1047) SpO2:  [95 %-100 %] 98 % (06/17 1047) Weight:  [97.7 kg] 97.7 kg (06/17 0235)  Weight change: -8.895 kg Filed Weights   08/29/23 1958 08/31/23 0235  Weight: 106.6 kg 97.7 kg    Intake/Output: I/O last 3 completed shifts: In: 879.5 [Blood:529.5; IV Piggyback:350] Out: 2000 [Other:2000]   Intake/Output this shift:  No intake/output data recorded.  Physical Exam: General: No acute distress  Head: [place]/AT, hearing intact  Eyes: Anicteric  Lungs:  Bilateral coarse breath sounds  Heart: Regular rate and rhythm  Abdomen:  Soft, nontender, obese  Extremities: No peripheral edema  Right AKA  Neurologic: Alert and able to answer few simple questions  Skin: No lesions  Access: Left aVF -needs revision    Basic Metabolic Panel: Recent Labs  Lab 08/29/23 1958 08/30/23 0405 08/31/23 0347  NA 138 138 133*  K 4.4 4.7 4.3  CL 98 94* 95*  CO2  25 31 25   GLUCOSE 97 94 87  BUN 41* 47* 32*  CREATININE 9.88* 10.35* 7.18*  CALCIUM  8.3* 8.1* 8.0*    Liver Function Tests: Recent Labs  Lab 08/29/23 1958  AST 22  ALT 13  ALKPHOS 69  BILITOT 0.9  PROT 6.7  ALBUMIN  2.7*   No results for input(s): LIPASE, AMYLASE in the last 168 hours. No results for input(s): AMMONIA in the last 168 hours.  CBC: Recent Labs  Lab 08/29/23 1958 08/30/23 0405 08/30/23 1743 08/31/23 0347  WBC 3.8* 4.3  --  5.2  NEUTROABS 2.4  --   --   --   HGB 7.4* 7.0* 8.8* 8.7*  HCT 25.1* 23.7* 28.9* 28.5*  MCV 94.7 94.4  --  91.9  PLT 95* 99*  --  88*    Cardiac Enzymes: No results for input(s): CKTOTAL, CKMB, CKMBINDEX, TROPONINI in the last 168 hours.  BNP: Invalid input(s): POCBNP  CBG: No results for input(s): GLUCAP in the last 168 hours.   Microbiology: Results for orders placed or performed during the hospital encounter of 08/29/23  Resp panel by RT-PCR (RSV, Flu A&B, Covid) Anterior Nasal Swab     Status: None   Collection Time: 08/29/23  8:00 PM   Specimen: Anterior Nasal Swab  Result Value Ref Range Status   SARS Coronavirus 2 by RT PCR NEGATIVE NEGATIVE Final    Comment: (NOTE) SARS-CoV-2 target nucleic acids are NOT DETECTED.  The SARS-CoV-2 RNA is generally detectable in upper respiratory specimens during the acute phase of infection. The  lowest concentration of SARS-CoV-2 viral copies this assay can detect is 138 copies/mL. A negative result does not preclude SARS-Cov-2 infection and should not be used as the sole basis for treatment or other patient management decisions. A negative result may occur with  improper specimen collection/handling, submission of specimen other than nasopharyngeal swab, presence of viral mutation(s) within the areas targeted by this assay, and inadequate number of viral copies(<138 copies/mL). A negative result must be combined with clinical observations, patient history, and  epidemiological information. The expected result is Negative.  Fact Sheet for Patients:  BloggerCourse.com  Fact Sheet for Healthcare Providers:  SeriousBroker.it  This test is no t yet approved or cleared by the [place]  FDA and  has been authorized for detection and/or diagnosis of SARS-CoV-2 by FDA under an Emergency Use Authorization (EUA). This EUA will remain  in effect (meaning this test can be used) for the duration of the COVID-19 declaration under Section 564(b)(1) of the Act, 21 U.S.C.section 360bbb-3(b)(1), unless the authorization is terminated  or revoked sooner.       Influenza A by PCR NEGATIVE NEGATIVE Final   Influenza B by PCR NEGATIVE NEGATIVE Final    Comment: (NOTE) The Xpert Xpress SARS-CoV-2/FLU/RSV plus assay is intended as an aid in the diagnosis of influenza from Nasopharyngeal swab specimens and should not be used as a sole basis for treatment. Nasal washings and aspirates are unacceptable for Xpert Xpress SARS-CoV-2/FLU/RSV testing.  Fact Sheet for Patients: BloggerCourse.com  Fact Sheet for Healthcare Providers: SeriousBroker.it  This test is not yet approved or cleared by the [place]  FDA and has been authorized for detection and/or diagnosis of SARS-CoV-2 by FDA under an Emergency Use Authorization (EUA). This EUA will remain in effect (meaning this test can be used) for the duration of the COVID-19 declaration under Section 564(b)(1) of the Act, 21 U.S.C. section 360bbb-3(b)(1), unless the authorization is terminated or revoked.     Resp Syncytial Virus by PCR NEGATIVE NEGATIVE Final    Comment: (NOTE) Fact Sheet for Patients: BloggerCourse.com  Fact Sheet for Healthcare Providers: SeriousBroker.it  This test is not yet approved or cleared by the [place]  FDA and has been  authorized for detection and/or diagnosis of SARS-CoV-2 by FDA under an Emergency Use Authorization (EUA). This EUA will remain in effect (meaning this test can be used) for the duration of the COVID-19 declaration under Section 564(b)(1) of the Act, 21 U.S.C. section 360bbb-3(b)(1), unless the authorization is terminated or revoked.  Performed at [facility], [address] [place], [place] [address]   Blood Culture (routine x 2)     Status: Abnormal (Preliminary result)   Collection Time: 08/30/23 12:06 AM   Specimen: BLOOD RIGHT HAND  Result Value Ref Range Status   Specimen Description   Final    BLOOD RIGHT HAND Performed at [facility], [address], [place], [place] [address]    Special Requests   Final    BOTTLES DRAWN AEROBIC AND ANAEROBIC Blood Culture results may not be optimal due to an inadequate volume of blood received in culture bottles Performed at [facility], [address] [place], [place] [address]    Culture  Setup Time   Final    GRAM POSITIVE COCCI IN BOTH AEROBIC AND ANAEROBIC BOTTLES CRITICAL RESULT CALLED TO, READ BACK BY AND VERIFIED WITH: Marguerite Shiley 08/30/23 1230 MW Performed at [facility], [address] [place], [place] [address]    Culture STAPHYLOCOCCUS AUREUS (  A)  Final   Report Status PENDING  Incomplete  Blood Culture (routine x 2)     Status: Abnormal (Preliminary result)   Collection Time: 08/30/23 12:06 AM   Specimen: BLOOD RIGHT ARM  Result Value Ref Range Status   Specimen Description   Final    BLOOD RIGHT ARM Performed at [facility], [address], [place], [place] [address]    Special Requests   Final    BOTTLES DRAWN AEROBIC AND ANAEROBIC Blood Culture results may not be optimal due to an inadequate volume of blood received in culture bottles Performed at [facility], [address] [place], [place] [address]    Culture  Setup Time   Final    GRAM  POSITIVE COCCI IN BOTH AEROBIC AND ANAEROBIC BOTTLES CRITICAL RESULT CALLED TO, READ BACK BY AND VERIFIED WITH: TREY GREENWOOD 08/30/23 1230 MW GRAM POSITIVE RODS ANAEROBIC BOTTLE ONLY CRITICAL RESULT CALLED TO, READ BACK BY AND VERIFIED WITH: PHARMD W. ANDERSON [id number] @1700  FH Performed at [facility], [address] [place], [place] [address]    Culture STAPHYLOCOCCUS AUREUS GRAM POSITIVE RODS  (A)  Final   Report Status PENDING  Incomplete  Blood Culture ID Panel (Reflexed)     Status: Abnormal   Collection Time: 08/30/23 12:06 AM  Result Value Ref Range Status   Enterococcus faecalis NOT DETECTED NOT DETECTED Final   Enterococcus Faecium NOT DETECTED NOT DETECTED Final   Listeria monocytogenes NOT DETECTED NOT DETECTED Final   Staphylococcus species DETECTED (A) NOT DETECTED Final    Comment: CRITICAL RESULT CALLED TO, READ BACK BY AND VERIFIED WITH: TREY GREENWOOD 08/30/23 1230 MW    Staphylococcus aureus (BCID) DETECTED (A) NOT DETECTED Final    Comment: Methicillin (oxacillin)-resistant Staphylococcus aureus (MRSA). MRSA is predictably resistant to beta-lactam antibiotics (except ceftaroline). Preferred therapy is vancomycin  unless clinically contraindicated. Patient requires contact precautions if  hospitalized. CRITICAL RESULT CALLED TO, READ BACK BY AND VERIFIED WITH: TREY GREENWOOD 08/30/23 1230 MW    Staphylococcus epidermidis NOT DETECTED NOT DETECTED Final   Staphylococcus lugdunensis NOT DETECTED NOT DETECTED Final   Streptococcus species NOT DETECTED NOT DETECTED Final   Streptococcus agalactiae NOT DETECTED NOT DETECTED Final   Streptococcus pneumoniae NOT DETECTED NOT DETECTED Final   Streptococcus pyogenes NOT DETECTED NOT DETECTED Final   A.calcoaceticus-baumannii NOT DETECTED NOT DETECTED Final   Bacteroides fragilis NOT DETECTED NOT DETECTED Final   Enterobacterales NOT DETECTED NOT DETECTED Final   Enterobacter cloacae complex NOT DETECTED NOT DETECTED  Final   Escherichia coli NOT DETECTED NOT DETECTED Final   Klebsiella aerogenes NOT DETECTED NOT DETECTED Final   Klebsiella oxytoca NOT DETECTED NOT DETECTED Final   Klebsiella pneumoniae NOT DETECTED NOT DETECTED Final   Proteus species NOT DETECTED NOT DETECTED Final   Salmonella species NOT DETECTED NOT DETECTED Final   Serratia marcescens NOT DETECTED NOT DETECTED Final   Haemophilus influenzae NOT DETECTED NOT DETECTED Final   Neisseria meningitidis NOT DETECTED NOT DETECTED Final   Pseudomonas aeruginosa NOT DETECTED NOT DETECTED Final   Stenotrophomonas maltophilia NOT DETECTED NOT DETECTED Final   Candida albicans NOT DETECTED NOT DETECTED Final   Candida auris NOT DETECTED NOT DETECTED Final   Candida glabrata NOT DETECTED NOT DETECTED Final   Candida krusei NOT DETECTED NOT DETECTED Final   Candida parapsilosis NOT DETECTED NOT DETECTED Final   Candida tropicalis NOT DETECTED NOT DETECTED Final   Cryptococcus neoformans/gattii NOT DETECTED NOT DETECTED Final  Meth resistant mecA/C and MREJ DETECTED (A) NOT DETECTED Final    Comment: CRITICAL RESULT CALLED TO, READ BACK BY AND VERIFIED WITH: Marguerite Shiley 08/30/23 1230 MW Performed at [facility], [address] [place], [place] [address]   MRSA Next Gen by PCR, Nasal     Status: Abnormal   Collection Time: 08/30/23  2:53 PM   Specimen: Nasal Mucosa; Nasal Swab  Result Value Ref Range Status   MRSA by PCR Next Gen DETECTED (A) NOT DETECTED Final    Comment: CRITICAL RESULT CALLED TO, READ BACK BY AND VERIFIED WITH:  Lorayne Rocks RN 08/30/2023 @1624  KKG (NOTE) The GeneXpert MRSA Assay (FDA approved for NASAL specimens only), is one component of a comprehensive MRSA colonization surveillance program. It is not intended to diagnose MRSA infection nor to guide or monitor treatment for MRSA infections. Test performance is not FDA approved in patients less than 33 years old. Performed at [facility], [address] [place], [place] [address]     Coagulation Studies: No results for input(s): LABPROT, INR in the last 72 hours.   Urinalysis: No results for input(s): COLORURINE, LABSPEC, PHURINE, GLUCOSEU, HGBUR, BILIRUBINUR, KETONESUR, PROTEINUR, UROBILINOGEN, NITRITE, LEUKOCYTESUR in the last 72 hours.  Invalid input(s): APPERANCEUR    Imaging: PERIPHERAL VASCULAR CATHETERIZATION Result Date: 08/31/2023 See surgical note for result.  CT Chest Wo Contrast Result Date: 08/29/2023 CLINICAL DATA:  Respiratory illness, nondiagnostic xray recent hemothorax, new respiratory symptoms, short of breath, recent history of sepsis EXAM: CT CHEST WITHOUT CONTRAST TECHNIQUE: Multidetector CT imaging of the chest was performed following the standard protocol without IV contrast. RADIATION DOSE REDUCTION: This exam was performed according to the departmental dose-optimization program which includes automated exposure control, adjustment of the mA and/or kV according to patient size and/or use of iterative reconstruction technique. COMPARISON:  08/11/2023, 08/05/2023 FINDINGS: Cardiovascular: Unenhanced imaging of the heart demonstrates stable cardiomegaly without pericardial effusion. Normal caliber of the thoracic aorta. Atherosclerosis of the aorta and coronary vasculature. Mediastinum/Nodes: Borderline enlarged mediastinal lymph nodes, largest measuring 10 mm in the right paratracheal region. Thyroid , trachea, and esophagus are unremarkable. Lungs/Pleura: Scattered nodular ground-glass airspace disease is seen bilaterally, greatest within the lower lobes. Findings are consistent with infection, aspiration, or hemorrhage. No effusion or pneumothorax. The central airways are patent. Upper Abdomen: Central venous catheter from an inferior approach, tip at the atriocaval junction. No acute upper abdominal findings. Musculoskeletal: No acute or destructive bony abnormalities.  Reconstructed images demonstrate no additional findings. IMPRESSION: 1. Scattered bilateral nodular ground-glass airspace disease, greatest in the lower lobes, favor bilateral pneumonia given clinical presentation. 2. Cardiomegaly. 3. Borderline mediastinal adenopathy, likely reactive. 4. Aortic Atherosclerosis (ICD10-I70.0). Coronary artery atherosclerosis. Electronically Signed   By: Bobbye Burrow M.D.   On: 08/29/2023 20:38      Medications:    sodium chloride      piperacillin -tazobactam (ZOSYN )  IV 2.25 g (08/31/23 0636)   vancomycin  Stopped (08/30/23 1413)     sodium chloride    Intravenous Once   amiodarone   200 mg Oral BID   apixaban   2.5 mg Oral BID   aspirin  EC  81 mg Oral Daily   carvedilol   6.25 mg Oral Daily   Chlorhexidine  Gluconate Cloth  6 each Topical Q0600   [START ON 09/01/2023] epoetin  alfa-epbx (RETACRIT ) injection  10,000 Units Intravenous Q M,W,F-1800   iron  polysaccharides  150 mg Oral Daily   leptospermum manuka honey  1 Application Topical Daily   rosuvastatin   40 mg Oral  Daily   sevelamer  carbonate  2,400 mg Oral TID WC   traZODone   100 mg Oral QHS   acetaminophen , albuterol , dextromethorphan-guaiFENesin , diphenhydrAMINE , melatonin, midodrine , oxyCODONE -acetaminophen   Assessment/ Plan:  [name] is a 63 y.o.  male with end stage renal disease on hemodialysis, diabetes, hypertension, CAD, Rt AKA, anemia, CHF who was admitted to [facility] on 08/29/2023 for revision of AVF. Patient had anaphylactic reaction and admitted to ICU.   [facility] [place]/MWF/left AVF  End-stage renal disease with hyperkalemia on hemodialysis.   - Hemodialysis performed on Monday.  2000 cc removed. - Tunneled dialysis catheter has been removed due to sepsis/bacteremia. - Plan for temporary dialysis catheter to be placed on Thursday - Next hemodialysis on Thursday after access is available.  2. Anemia of chronic kidney disease Lab Results  Component Value Date   HGB 8.7  (L) 08/31/2023   Continue Epogen  10,000 units IV with dialysis treatments. Given 1 unit blood transfusion during dialysis on Monday (6/16)  3. Secondary Hyperparathyroidism: with outpatient labs:     Lab Results  Component Value Date   CALCIUM  8.0 (L) 08/31/2023   CAION 1.06 (L) 08/05/2023   PHOS 2.5 08/14/2023  Monitor calcium  and phosphorus throughout hospitalization.  4. Pneumonia, sepsis and shortness of breath - Blood cultures (6/16) positive for MRSA and gram-positive rods  - iv Abx as per hospitalist team  - Remains on 2 L nasal cannula oxygen supplementation   LOS: 2 Rhonna Holster 6/17/20251:54 PM

## 2023-08-31 NOTE — H&P (View-Only) (Signed)
 Hospital Consult    Reason for Consult:  Dialysis Perma Catheter removal  Requesting Physician:  Dr Rica Chalet MD  MRN #:  [id number]  History of Present Illness: This is a 63 y.o. male Adam Howell is a 63 y.o. male with medical history significant of ESRD-HD (MWF), HTN, HLD, DM, CAD, dCHF, A fib on Eliquis , amenia, chronic pain, s/p of right AKA, who presents with SOB. Patient recently had complicated admission from 5/22 - 5/29.  At that time he presented for an elective AV fistula revision.  During admission he had hypotension with PermCath placement which was concerning for bleeding and possible IV contrast reaction, required intubation and vasopressor support, extubated on 5/26.  Pt was discharged with plans for dialysis using right groin permacatheter.    Upon work up patient was found to be bacteremic and now needs his dialysis perma catheter removed. Vascular surgery was consulted to remove it.   Past Medical History:  Diagnosis Date   Anemia in chronic kidney disease    Cellulitis of left lower extremity    Chronic atrial fibrillation (HCC)    Chronic kidney disease    Congestive heart failure (HCC)    Difficult intubation    End-stage renal disease on hemodialysis (HCC)    Hyperlipidemia    Hypertension    Type 2 diabetes mellitus with complication [facility])     Past Surgical History:  Procedure Laterality Date   A/V FISTULAGRAM Left 08/27/2022   Procedure: A/V Fistulagram;  Surgeon: Celso College, MD;  Location: [facility];  Service: Cardiovascular;  Laterality: Left;   A/V FISTULAGRAM Left 08/12/2023   Procedure: A/V Fistulagram;  Surgeon: Celso College, MD;  Location: [facility];  Service: Cardiovascular;  Laterality: Left;   AMPUTATION Right 03/13/2022   Procedure: AMPUTATION ABOVE KNEE;  Surgeon: Celso College, MD;  Location: [facility] OR[facility];  Service: General;  Laterality: Right;   APPLICATION OF WOUND VAC Right 03/07/2022   Procedure: APPLICATION OF  WOUND VAC;  Surgeon: Lesta Rater, MD;  Location: [facility];  Service: Vascular;  Laterality: Right;  GAAC [id number]   CENTRAL LINE INSERTION Left 08/05/2023   Procedure: CENTRAL LINE INSERTION;  Surgeon: Celso College, MD;  Location: [facility];  Service: Vascular;  Laterality: Left;   DIALYSIS/PERMA CATHETER INSERTION Right 08/05/2023   Procedure: DIALYSIS/PERMA CATHETER INSERTION;  Surgeon: Celso College, MD;  Location: [facility];  Service: Vascular;  Laterality: Right;   GROIN DISSECTION Right 08/05/2023   Procedure: right groin venogram, angioplasty of superior venacava and anomic vein;  Surgeon: Celso College, MD;  Location: [facility];  Service: Vascular;  Laterality: Right;   LOWER EXTREMITY ANGIOGRAPHY Left 02/15/2023   Procedure: Lower Extremity Angiography;  Surgeon: Celso College, MD;  Location: [facility];  Service: Cardiovascular;  Laterality: Left;   REVISON OF ARTERIOVENOUS FISTULA Left 08/05/2023   Procedure: REVISON OF ARTERIOVENOUS FISTULA;  Surgeon: Celso College, MD;  Location: [facility];  Service: Vascular;  Laterality: Left;  JUMP GRAFT   WOUND DEBRIDEMENT Left    WOUND DEBRIDEMENT Right 01/16/2022   Procedure: DEBRIDEMENT WOUND;  Surgeon: Jackquelyn Mass, MD;  Location: [facility];  Service: Vascular;  Laterality: Right;  Wound vac placement   WOUND DEBRIDEMENT Right 03/07/2022   Procedure: DEBRIDEMENT WOUND RIGHT LOWER EXTREMITY;  Surgeon: Lesta Rater, MD;  Location: [facility];  Service: Vascular;  Laterality: Right;    Allergies  Allergen Reactions   Ivp  Dye [Iodinated Contrast Media] Anaphylaxis   Tramadol  Rash    Prior to Admission medications   Medication Sig Start Date End Date Taking? Authorizing Provider  acetaminophen  (TYLENOL ) 325 MG tablet Take 2 tablets (650 mg total) by mouth every 6 (six) hours as needed for mild pain (pain score 1-3), fever or headache (or Fever >/= 101). 03/04/23  Yes Althia Atlas, MD  amiodarone  (PACERONE ) 200 MG tablet Take 1 tablet  (200 mg total) by mouth 2 (two) times daily. 10/12/22  Yes Cannady, Jolene T, NP  aspirin  EC 81 MG tablet Take 1 tablet (81 mg total) by mouth daily. Swallow whole. 03/05/23  Yes Althia Atlas, MD  carvedilol  (COREG ) 6.25 MG tablet Take 6.25 mg by mouth daily. 06/25/23  Yes [provider]  ELIQUIS  2.5 MG TABS tablet Take 1 tablet (2.5 mg total) by mouth 2 (two) times daily. 10/12/22  Yes Cannady, Jolene T, NP  iron  polysaccharides (NIFEREX) 150 MG capsule Take 1 capsule (150 mg total) by mouth daily. 03/05/23  Yes Althia Atlas, MD  midodrine  (PROAMATINE ) 5 MG tablet Take 5 mg by mouth as needed (post dialysis hypotension SBP<90 or DBP<55).   Yes [provider]  ondansetron  (ZOFRAN ) 4 MG tablet Take 4 mg by mouth every 6 (six) hours as needed for nausea or vomiting. 08/01/23  Yes [provider]  oxyCODONE -acetaminophen  (PERCOCET/ROXICET) 5-325 MG tablet Take 1-2 tablets by mouth every 4 (four) hours as needed for moderate pain (pain score 4-6). 08/16/23  Yes Josip Merolla R, NP  rosuvastatin  (CRESTOR ) 40 MG tablet Take 1 tablet (40 mg total) by mouth daily. 10/12/22  Yes Cannady, Jolene T, NP  sevelamer  carbonate (RENVELA ) 800 MG tablet Take 2,400 mg by mouth 3 (three) times daily. 09/21/22  Yes [provider]  traZODone  (DESYREL ) 100 MG tablet Take 1 tablet (100 mg total) by mouth at bedtime. 01/12/23  Yes Cannady, Jolene T, NP  melatonin 3 MG TABS tablet Take 3 mg by mouth at bedtime. Patient not taking: Reported on 08/29/2023    [provider]  SANTYL  250 UNIT/GM ointment Apply 1 Application topically daily. Patient not taking: Reported on 08/29/2023 02/08/23   [provider]  senna (SENOKOT) 8.6 MG TABS tablet Take 2 tablets (17.2 mg total) by mouth at bedtime. Patient not taking: Reported on 08/29/2023 04/03/22   Love, Renay Carota, PA-C    Social History   Socioeconomic History   Marital status: Divorced    Spouse name: Kyan Yurkovich   Number  of children: 4   Years of education: Not on file   Highest education level: Not on file  Occupational History   Not on file  Tobacco Use   Smoking status: Never   Smokeless tobacco: Never  Vaping Use   Vaping status: Never Used  Substance and Sexual Activity   Alcohol use: Not Currently   Drug use: Never   Sexual activity: Not Currently  Other Topics Concern   Not on file  Social History Narrative   Lives with daughter Adam Howell.     Social Drivers of Corporate investment banker Strain: Low Risk  (11/25/2021)   Overall Financial Resource Strain (CARDIA)    Difficulty of Paying Living Expenses: Not hard at all  Food Insecurity: No Food Insecurity (08/31/2023)   Hunger Vital Sign    Worried About Running Out of Food in the Last Year: Never true    Ran Out of Food in the Last Year: Never true  Transportation Needs:  No Transportation Needs (08/31/2023)   PRAPARE - Administrator, Civil Service (Medical): No    Lack of Transportation (Non-Medical): No  Physical Activity: Insufficiently Active (11/25/2021)   Exercise Vital Sign    Days of Exercise per Week: 2 days    Minutes of Exercise per Session: 60 min  Stress: No Stress Concern Present (11/25/2021)   [employer] of Occupational Health - Occupational Stress Questionnaire    Feeling of Stress : Not at all  Social Connections: Moderately Isolated (12/29/2022)   Social Connection and Isolation Panel    Frequency of Communication with Friends and Family: More than three times a week    Frequency of Social Gatherings with Friends and Family: Three times a week    Attends Religious Services: 1 to 4 times per year    Active Member of Clubs or Organizations: No    Attends Banker Meetings: Never    Marital Status: Never married  Intimate Partner Violence: Not At Risk (08/31/2023)   Humiliation, Afraid, Rape, and Kick questionnaire    Fear of Current or Ex-Partner: No    Emotionally Abused: No     Physically Abused: No    Sexually Abused: No     Family History  Problem Relation Age of Onset   Heart failure Mother    Cirrhosis Father    Hypertension Daughter     ROS: Otherwise negative unless mentioned in HPI  Physical Examination  Vitals:   08/30/23 2143 08/31/23 0235  BP: (!) 96/47 118/78  Pulse: 98 (!) 56  Resp: 20 20  Temp: 99.4 F (37.4 C) 98.4 F (36.9 C)  SpO2: 99% 100%   Body mass index is 26.92 kg/m.  General:  WDWN in NAD Gait: Not observed HENT: WNL, normocephalic Pulmonary: normal non-labored breathing, without Rales, rhonchi,  wheezing Cardiac: regular, without  Murmurs, rubs or gallops; without carotid bruits Abdomen: Positive bowel sounds throughout, soft, NT/ND, no masses Skin: without rashes Vascular Exam/Pulses: Palpable pulses throughout. All extremities are warm to touch.  Extremities: without ischemic changes, without Gangrene , without cellulitis; with open wounds;  Musculoskeletal: no muscle wasting or atrophy  Neurologic: A&O X 3;  No focal weakness or paresthesias are detected; speech is fluent/normal Psychiatric:  The pt has Normal affect. Lymph:  Unremarkable  CBC    Component Value Date/Time   WBC 5.2 08/31/2023 0347   RBC 3.10 (L) 08/31/2023 0347   HGB 8.7 (L) 08/31/2023 0347   HGB 16.8 01/12/2023 1414   HCT 28.5 (L) 08/31/2023 0347   HCT 51.0 01/12/2023 1414   PLT 88 (L) 08/31/2023 0347   PLT 257 01/12/2023 1414   MCV 91.9 08/31/2023 0347   MCV 88 01/12/2023 1414   MCH 28.1 08/31/2023 0347   MCHC 30.5 08/31/2023 0347   RDW 18.9 (H) 08/31/2023 0347   RDW 14.8 01/12/2023 1414   LYMPHSABS 0.7 08/29/2023 1958   LYMPHSABS 1.0 01/12/2023 1414   MONOABS 0.5 08/29/2023 1958   EOSABS 0.2 08/29/2023 1958   EOSABS 0.2 01/12/2023 1414   BASOSABS 0.0 08/29/2023 1958   BASOSABS 0.1 01/12/2023 1414    BMET    Component Value Date/Time   NA 133 (L) 08/31/2023 0347   NA 136 04/14/2023 1610   K 4.3 08/31/2023 0347   CL 95  (L) 08/31/2023 0347   CO2 25 08/31/2023 0347   GLUCOSE 87 08/31/2023 0347   BUN 32 (H) 08/31/2023 0347   BUN 18 04/14/2023 1610   CREATININE  7.18 (H) 08/31/2023 0347   CALCIUM  8.0 (L) 08/31/2023 0347   GFRNONAA 8 (L) 08/31/2023 0347    COAGS: Lab Results  Component Value Date   INR 1.3 (H) 08/05/2023   INR 1.2 03/05/2022     Non-Invasive Vascular Imaging:   None Ordered   Statin:  Yes.   Beta Blocker:  Yes.   Aspirin :  Yes.   ACEI:  No. ARB:  No. CCB use:  No Other antiplatelets/anticoagulants:  No.    ASSESSMENT/PLAN: This is a 63 y.o. male who presents to [facility] emergency department with shortness of breath.  He is a known dialysis patient to vascular surgery.  Upon workup he was found to be bacteremic.  Vascular surgery was consulted to remove the patient's dialysis access permacatheter.  I discussed in detail with the patient the procedure, benefits, risk, complications.  Patient verbalized understanding wishes to proceed.  Answered all his questions this morning.  Patient does not need to be n.p.o. for this procedure.  -I discussed the case with Dr. Devon Fogo MD and he agrees with plan.   Annamaria [facility]rette [facility] 08/31/2023 7:22 AM

## 2023-08-31 NOTE — Interval H&P Note (Signed)
 History and Physical Interval Note:  08/31/2023 10:28 AM  Adam Howell  has presented today for surgery, with the diagnosis of Bacteremia.  The various methods of treatment have been discussed with the patient and family. After consideration of risks, benefits and other options for treatment, the patient has consented to  Procedure(s): DIALYSIS/PERMA CATHETER REMOVAL (Right) as a surgical intervention.  The patient's history has been reviewed, patient examined, no change in status, stable for surgery.  I have reviewed the patient's chart and labs.  Questions were answered to the patient's satisfaction.     Devon Fogo

## 2023-08-31 NOTE — Op Note (Signed)
  OPERATIVE NOTE   PROCEDURE: Removal of a right femoral tunneled dialysis catheter  PRE-OPERATIVE DIAGNOSIS: Complication of dialysis catheter, End stage renal disease  POST-OPERATIVE DIAGNOSIS: Same  SURGEON: Devon Fogo, M.D.  ANESTHESIA: Local anesthetic with 1% lidocaine  with epinephrine    ESTIMATED BLOOD LOSS: Minimal   FINDING(S): 1. Catheter intact   SPECIMEN(S):  Catheter  INDICATIONS:   Adam Howell is a 63 y.o. male who presents with possible catheter related infection.  The patient has undergone placement of an extremity access which is working and this has been successfully cannulated without difficulty.  therefore is undergoing removal of his tunneled catheter which is no longer needed to avoid septic complications.   DESCRIPTION: After obtaining full informed written consent, the patient was positioned supine. The right femoral catheter and surrounding area is prepped and draped in a sterile fashion. The cuff was localized by palpation and noted to be less than 3 cm from the exit site. After appropriate timeout is called, 1% lidocaine  with epinephrine  is infiltrated into the surrounding tissues around the cuff. Small transverse incision is created at the exit site with an 11 blade scalpel and the dissection was carried up along the catheter to expose the cuff of the tunneled catheter.  The catheter cuff is then freed from the surrounding attachments and adhesions. Once the catheter has been freed circumferentially it is removed in 1 piece. Light pressure was held at the base of the neck.  Tip of the catheter was transected with sterile scissors placed in a sterile cup and sent to microbiology for culture.  Antibiotic ointment and a sterile dressing is applied to the exit site. Patient tolerated procedure well and there were no complications.  COMPLICATIONS: None  CONDITION: Unchanged  Devon Fogo, M.D. [facility] Office:  [phone]  08/31/2023,10:29 AM

## 2023-08-31 NOTE — Progress Notes (Signed)
 INFECTIOUS DISEASE PROGRESS NOTE Date of Admission:  08/29/2023     ID: Adam Howell is a 64 y.o. male with  MRSA bacteremia Principal Problem:   HCAP (healthcare-associated pneumonia) Active Problems:   ESRD on dialysis (HCC)   Type 2 diabetes mellitus with ESRD (end-stage renal disease) (HCC)   Chronic pain syndrome   Atrial fibrillation, chronic (HCC)   Hyperlipidemia associated with type 2 diabetes mellitus (HCC)   Aspiration pneumonia (HCC)   Chronic diastolic CHF (congestive heart failure) (HCC)   Pancytopenia (HCC)   Anemia in ESRD (end-stage renal disease) (HCC)   Overweight (BMI 25.0-29.9)   CAD (coronary artery disease)   Myocardial injury   Encounter for dialysis catheter care [facility])   Subjective: He has no fever and white blood count is 5.2.  Blood cultures done 617.  Wounds from AV fistula site.  Sputum culture pending.  Seen by vascular surgery and had removal of his right femoral tunneled dialysis catheter 617 culture pending.  ROS  Eleven systems are reviewed and negative except per hpi  Medications:  Antibiotics Given (last 72 hours)     Date/Time Action Medication Dose Rate   08/30/23 0039 New Bag/Given   vancomycin  (VANCOREADY) IVPB 1250 mg/250 mL 1,250 mg 166.7 mL/hr   08/30/23 0040 New Bag/Given   piperacillin -tazobactam (ZOSYN ) IVPB 2.25 g 2.25 g 100 mL/hr   08/30/23 1191 New Bag/Given   vancomycin  (VANCOREADY) IVPB 1250 mg/250 mL 1,250 mg 166.7 mL/hr   08/30/23 1109 New Bag/Given   vancomycin  (VANCOCIN ) IVPB 1000 mg/200 mL premix 1,000 mg 200 mL/hr   08/30/23 1444 New Bag/Given  [Pt arrived to floor after dialysis]   piperacillin -tazobactam (ZOSYN ) IVPB 2.25 g 2.25 g 100 mL/hr   08/30/23 2213 New Bag/Given   piperacillin -tazobactam (ZOSYN ) IVPB 2.25 g 2.25 g 100 mL/hr   08/31/23 0636 New Bag/Given   piperacillin -tazobactam (ZOSYN ) IVPB 2.25 g 2.25 g 100 mL/hr       sodium chloride    Intravenous Once   amiodarone   200 mg Oral BID    apixaban   2.5 mg Oral BID   aspirin  EC  81 mg Oral Daily   carvedilol   6.25 mg Oral Daily   Chlorhexidine  Gluconate Cloth  6 each Topical Q0600   [START ON 09/01/2023] epoetin  alfa-epbx (RETACRIT ) injection  10,000 Units Intravenous Q M,W,F-1800   iron  polysaccharides  150 mg Oral Daily   leptospermum manuka honey  1 Application Topical Daily   rosuvastatin   40 mg Oral Daily   sevelamer  carbonate  2,400 mg Oral TID WC   traZODone   100 mg Oral QHS    Objective: Vital signs in last 24 hours: Temp:  [97.7 F (36.5 C)-99.4 F (37.4 C)] 99.4 F (37.4 C) (06/17 1045) Pulse Rate:  [52-98] 61 (06/17 1047) Resp:  [16-29] 16 (06/17 1045) BP: (74-126)/(37-78) 126/45 (06/17 1047) SpO2:  [95 %-100 %] 98 % (06/17 1047) Weight:  [97.7 kg] 97.7 kg (06/17 0235) Constitutional: He is awake and interactive.  HENT: Mouth/Throat: Oropharynx is clear and moist. No oropharyngeal exudate.  Cardiovascular: Normal rate, regular rhythm and normal heart sounds. 2/6 sm Pulmonary/Chest: Effort normal and breath sounds normal. No respiratory distress. He has no wheezes.  Abdominal: Soft. Bowel sounds are normal. He exhibits no distension. There is no tenderness.  Lymphadenopathy: He has no cervical adenopathy.  Neurological: [name] and oriented to person, place, and time.  Skin:  L AVF site- large scab over distal site, expressed  purulence over prior incision. No  thrill     Lab Results Recent Labs    08/30/23 0405 08/30/23 1743 08/31/23 0347  WBC 4.3  --  5.2  HGB 7.0* 8.8* 8.7*  HCT 23.7* 28.9* 28.5*  NA 138  --  133*  K 4.7  --  4.3  CL 94*  --  95*  CO2 31  --  25  BUN 47*  --  32*  CREATININE 10.35*  --  7.18*    Microbiology: Results for orders placed or performed during the hospital encounter of 08/29/23  Resp panel by RT-PCR (RSV, Flu A&B, Covid) Anterior Nasal Swab     Status: None   Collection Time: 08/29/23  8:00 PM   Specimen: Anterior Nasal Swab  Result Value Ref Range  Status   SARS Coronavirus 2 by RT PCR NEGATIVE NEGATIVE Final    Comment: (NOTE) SARS-CoV-2 target nucleic acids are NOT DETECTED.  The SARS-CoV-2 RNA is generally detectable in upper respiratory specimens during the acute phase of infection. The lowest concentration of SARS-CoV-2 viral copies this assay can detect is 138 copies/mL. A negative result does not preclude SARS-Cov-2 infection and should not be used as the sole basis for treatment or other patient management decisions. A negative result may occur with  improper specimen collection/handling, submission of specimen other than nasopharyngeal swab, presence of viral mutation(s) within the areas targeted by this assay, and inadequate number of viral copies(<138 copies/mL). A negative result must be combined with clinical observations, patient history, and epidemiological information. The expected result is Negative.  Fact Sheet for Patients:  BloggerCourse.com  Fact Sheet for Healthcare Providers:  SeriousBroker.it  This test is no t yet approved or cleared by the [place]  FDA and  has been authorized for detection and/or diagnosis of SARS-CoV-2 by FDA under an Emergency Use Authorization (EUA). This EUA will remain  in effect (meaning this test can be used) for the duration of the COVID-19 declaration under Section 564(b)(1) of the Act, 21 U.S.C.section 360bbb-3(b)(1), unless the authorization is terminated  or revoked sooner.       Influenza A by PCR NEGATIVE NEGATIVE Final   Influenza B by PCR NEGATIVE NEGATIVE Final    Comment: (NOTE) The Xpert Xpress SARS-CoV-2/FLU/RSV plus assay is intended as an aid in the diagnosis of influenza from Nasopharyngeal swab specimens and should not be used as a sole basis for treatment. Nasal washings and aspirates are unacceptable for Xpert Xpress SARS-CoV-2/FLU/RSV testing.  Fact Sheet for  Patients: BloggerCourse.com  Fact Sheet for Healthcare Providers: SeriousBroker.it  This test is not yet approved or cleared by the [place]  FDA and has been authorized for detection and/or diagnosis of SARS-CoV-2 by FDA under an Emergency Use Authorization (EUA). This EUA will remain in effect (meaning this test can be used) for the duration of the COVID-19 declaration under Section 564(b)(1) of the Act, 21 U.S.C. section 360bbb-3(b)(1), unless the authorization is terminated or revoked.     Resp Syncytial Virus by PCR NEGATIVE NEGATIVE Final    Comment: (NOTE) Fact Sheet for Patients: BloggerCourse.com  Fact Sheet for Healthcare Providers: SeriousBroker.it  This test is not yet approved or cleared by the [place]  FDA and has been authorized for detection and/or diagnosis of SARS-CoV-2 by FDA under an Emergency Use Authorization (EUA). This EUA will remain in effect (meaning this test can be used) for the duration of the COVID-19 declaration under Section 564(b)(1) of the Act, 21 U.S.C. section 360bbb-3(b)(1), unless the authorization is terminated or revoked.  Performed at [facility], [address], [place], [place] [address]   Blood Culture (routine x 2)     Status: Abnormal (Preliminary result)   Collection Time: 08/30/23 12:06 AM   Specimen: BLOOD RIGHT HAND  Result Value Ref Range Status   Specimen Description   Final    BLOOD RIGHT HAND Performed at [facility], [address], [place], [place] [address]    Special Requests   Final    BOTTLES DRAWN AEROBIC AND ANAEROBIC Blood Culture results may not be optimal due to an inadequate volume of blood received in culture bottles Performed at [facility], [address] [place], [place] [address]    Culture  Setup Time   Final    GRAM POSITIVE COCCI IN BOTH AEROBIC AND  ANAEROBIC BOTTLES CRITICAL RESULT CALLED TO, READ BACK BY AND VERIFIED WITH: Marguerite Shiley 08/30/23 1230 MW Performed at [facility], [address], [place], [place] [address]    Culture STAPHYLOCOCCUS AUREUS (A)  Final   Report Status PENDING  Incomplete  Blood Culture (routine x 2)     Status: Abnormal (Preliminary result)   Collection Time: 08/30/23 12:06 AM   Specimen: BLOOD RIGHT ARM  Result Value Ref Range Status   Specimen Description   Final    BLOOD RIGHT ARM Performed at [facility], [address] [place], [place] [address]    Special Requests   Final    BOTTLES DRAWN AEROBIC AND ANAEROBIC Blood Culture results may not be optimal due to an inadequate volume of blood received in culture bottles Performed at [facility], [address] [place], [place] [address]    Culture  Setup Time   Final    GRAM POSITIVE COCCI IN BOTH AEROBIC AND ANAEROBIC BOTTLES CRITICAL RESULT CALLED TO, READ BACK BY AND VERIFIED WITH: TREY GREENWOOD 08/30/23 1230 MW GRAM POSITIVE RODS ANAEROBIC BOTTLE ONLY CRITICAL RESULT CALLED TO, READ BACK BY AND VERIFIED WITH: PHARMD W. ANDERSON [id number] @1700  FH Performed at [facility], [address], [place], [place] [address]    Culture STAPHYLOCOCCUS AUREUS GRAM POSITIVE RODS  (A)  Final   Report Status PENDING  Incomplete  Blood Culture ID Panel (Reflexed)     Status: Abnormal   Collection Time: 08/30/23 12:06 AM  Result Value Ref Range Status   Enterococcus faecalis NOT DETECTED NOT DETECTED Final   Enterococcus Faecium NOT DETECTED NOT DETECTED Final   Listeria monocytogenes NOT DETECTED NOT DETECTED Final   Staphylococcus species DETECTED (A) NOT DETECTED Final    Comment: CRITICAL RESULT CALLED TO, READ BACK BY AND VERIFIED WITH: TREY GREENWOOD 08/30/23 1230 MW    Staphylococcus aureus (BCID) DETECTED (A) NOT DETECTED Final    Comment: Methicillin (oxacillin)-resistant Staphylococcus aureus (MRSA). MRSA  is predictably resistant to beta-lactam antibiotics (except ceftaroline). Preferred therapy is vancomycin  unless clinically contraindicated. Patient requires contact precautions if  hospitalized. CRITICAL RESULT CALLED TO, READ BACK BY AND VERIFIED WITH: TREY GREENWOOD 08/30/23 1230 MW    Staphylococcus epidermidis NOT DETECTED NOT DETECTED Final   Staphylococcus lugdunensis NOT DETECTED NOT DETECTED Final   Streptococcus species NOT DETECTED NOT DETECTED Final   Streptococcus agalactiae NOT DETECTED NOT DETECTED Final   Streptococcus pneumoniae NOT DETECTED NOT DETECTED Final   Streptococcus pyogenes NOT DETECTED NOT DETECTED Final   A.calcoaceticus-baumannii NOT DETECTED NOT DETECTED Final   Bacteroides fragilis NOT DETECTED NOT DETECTED Final   Enterobacterales NOT DETECTED NOT DETECTED Final  Enterobacter cloacae complex NOT DETECTED NOT DETECTED Final   Escherichia coli NOT DETECTED NOT DETECTED Final   Klebsiella aerogenes NOT DETECTED NOT DETECTED Final   Klebsiella oxytoca NOT DETECTED NOT DETECTED Final   Klebsiella pneumoniae NOT DETECTED NOT DETECTED Final   Proteus species NOT DETECTED NOT DETECTED Final   Salmonella species NOT DETECTED NOT DETECTED Final   Serratia marcescens NOT DETECTED NOT DETECTED Final   Haemophilus influenzae NOT DETECTED NOT DETECTED Final   Neisseria meningitidis NOT DETECTED NOT DETECTED Final   Pseudomonas aeruginosa NOT DETECTED NOT DETECTED Final   Stenotrophomonas maltophilia NOT DETECTED NOT DETECTED Final   Candida albicans NOT DETECTED NOT DETECTED Final   Candida auris NOT DETECTED NOT DETECTED Final   Candida glabrata NOT DETECTED NOT DETECTED Final   Candida krusei NOT DETECTED NOT DETECTED Final   Candida parapsilosis NOT DETECTED NOT DETECTED Final   Candida tropicalis NOT DETECTED NOT DETECTED Final   Cryptococcus neoformans/gattii NOT DETECTED NOT DETECTED Final   Meth resistant mecA/C and MREJ DETECTED (A) NOT DETECTED Final     Comment: CRITICAL RESULT CALLED TO, READ BACK BY AND VERIFIED WITH: Marguerite Shiley 08/30/23 1230 MW Performed at [facility], [address] [place], [place] [address]   MRSA Next Gen by PCR, Nasal     Status: Abnormal   Collection Time: 08/30/23  2:53 PM   Specimen: Nasal Mucosa; Nasal Swab  Result Value Ref Range Status   MRSA by PCR Next Gen DETECTED (A) NOT DETECTED Final    Comment: CRITICAL RESULT CALLED TO, READ BACK BY AND VERIFIED WITH:  Lorayne Rocks RN 08/30/2023 @1624  KKG (NOTE) The GeneXpert MRSA Assay (FDA approved for NASAL specimens only), is one component of a comprehensive MRSA colonization surveillance program. It is not intended to diagnose MRSA infection nor to guide or monitor treatment for MRSA infections. Test performance is not FDA approved in patients less than 29 years old. Performed at [facility], [address] [place], [place] [address]     Studies/Results: PERIPHERAL VASCULAR CATHETERIZATION Result Date: 08/31/2023 See surgical note for result.  CT Chest Wo Contrast Result Date: 08/29/2023 CLINICAL DATA:  Respiratory illness, nondiagnostic xray recent hemothorax, new respiratory symptoms, short of breath, recent history of sepsis EXAM: CT CHEST WITHOUT CONTRAST TECHNIQUE: Multidetector CT imaging of the chest was performed following the standard protocol without IV contrast. RADIATION DOSE REDUCTION: This exam was performed according to the departmental dose-optimization program which includes automated exposure control, adjustment of the mA and/or kV according to patient size and/or use of iterative reconstruction technique. COMPARISON:  08/11/2023, 08/05/2023 FINDINGS: Cardiovascular: Unenhanced imaging of the heart demonstrates stable cardiomegaly without pericardial effusion. Normal caliber of the thoracic aorta. Atherosclerosis of the aorta and coronary vasculature. Mediastinum/Nodes: Borderline enlarged mediastinal lymph nodes,  largest measuring 10 mm in the right paratracheal region. Thyroid , trachea, and esophagus are unremarkable. Lungs/Pleura: Scattered nodular ground-glass airspace disease is seen bilaterally, greatest within the lower lobes. Findings are consistent with infection, aspiration, or hemorrhage. No effusion or pneumothorax. The central airways are patent. Upper Abdomen: Central venous catheter from an inferior approach, tip at the atriocaval junction. No acute upper abdominal findings. Musculoskeletal: No acute or destructive bony abnormalities. Reconstructed images demonstrate no additional findings. IMPRESSION: 1. Scattered bilateral nodular ground-glass airspace disease, greatest in the lower lobes, favor bilateral pneumonia given clinical presentation. 2. Cardiomegaly. 3. Borderline mediastinal adenopathy, likely reactive. 4. Aortic Atherosclerosis (ICD10-I70.0). Coronary artery atherosclerosis. Electronically Signed   By: Bobbye Burrow  M.D.   On: 08/29/2023 20:38    Assessment/Plan: Bejamin Hackbart is a 63 y.o. male with ESRD on HD through R groin permacath, s.p recent hospitalization with L AVF revision admitted with SOB and found to have bil Ground glass nodular disease and MRSA bacteremia 6/17- fu bcx today ngtd. Fem HD cath  removed 6/17   Recommendations MRSA bacteremia- Had a permacath R groin - removed 6/17. Has some purulence at prior AVF site and L ant shin which I have cultured. Could be source of infection Will cont vanco Check echo  AVF purulence Assess AVF site for deeper infection. Cx done yesterday   Possible PNA - mainly CT with mild nodular disease.  so far wu neg. Will await strep PNA and urine leg ag and sputum cx.   L shin wound- consult wound care Thank you very much for the consult. Will follow with you.  Eartha Gold   08/31/2023, 2:35 PM

## 2023-08-31 NOTE — Progress Notes (Signed)
  Vein and Vascular Surgery  Daily Progress Note   Subjective  - * Day of Surgery *  Patient resting comfortably watching TV when I enter the room.  He denies pain in the forearm.  However, I was contacted by Dr. Harwood Lingo who was able to express purulent material from incisional site.  Objective Vitals:   08/31/23 0900 08/31/23 1045 08/31/23 1047 08/31/23 1456  BP: (!) 99/52 (!) 74/37 (!) 126/45 (!) 110/55  Pulse:  (!) 52 61 60  Resp: (!) 22 16  16   Temp: 99.4 F (37.4 C) 99.4 F (37.4 C)  98.7 F (37.1 C)  TempSrc: Oral Oral  Oral  SpO2: 95% 98% 98% 96%  Weight:      Height:        Intake/Output Summary (Last 24 hours) at 08/31/2023 1753 Last data filed at 08/31/2023 1457 Gross per 24 hour  Intake 350 ml  Output --  Net 350 ml    PULM  Normal effort , no use of accessory muscles CV  No JVD, RRR Abd      No distended, nontender VASC  left forearm AV access no thrill no bruit the more proximal incision does have purulent material coming from it.  Distally the aneurysmal area appears to have a large scab and I suspect that this is a full-thickness injury exposing the aneurysm contents.  Laboratory CBC    Component Value Date/Time   WBC 5.2 08/31/2023 0347   HGB 8.7 (L) 08/31/2023 0347   HGB 16.8 01/12/2023 1414   HCT 28.5 (L) 08/31/2023 0347   HCT 51.0 01/12/2023 1414   PLT 88 (L) 08/31/2023 0347   PLT 257 01/12/2023 1414    BMET    Component Value Date/Time   NA 133 (L) 08/31/2023 0347   NA 136 04/14/2023 1610   K 4.3 08/31/2023 0347   CL 95 (L) 08/31/2023 0347   CO2 25 08/31/2023 0347   GLUCOSE 87 08/31/2023 0347   BUN 32 (H) 08/31/2023 0347   BUN 18 04/14/2023 1610   CREATININE 7.18 (H) 08/31/2023 0347   CALCIUM  8.0 (L) 08/31/2023 0347   GFRNONAA 8 (L) 08/31/2023 0347    Assessment/Planning: Infected left forearm AV access: The patient 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  Specifically, there is an  Artegraft placed with 2 Viabahn stents.  These appear to be infected given the purulent material and therefore I have recommended excision.  Risks and benefits have been reviewed with the patient all questions been answered at the time of the procedure I will place a temporary femoral dialysis catheter as the patient is quite pain adverse and he will require dialysis in the next 48 hours.  The patient agrees with this plan and we will move forward tomorrow with excision of the graft placement of the temporary dialysis catheter.   Adam Howell  08/31/2023, 5:53 PM

## 2023-08-31 NOTE — Plan of Care (Signed)
  Problem: Activity: Goal: Ability to tolerate increased activity will improve Outcome: Progressing   Problem: Clinical Measurements: Goal: Ability to maintain a body temperature in the normal range will improve Outcome: Progressing   Problem: Respiratory: Goal: Ability to maintain adequate ventilation will improve Outcome: Progressing   

## 2023-08-31 NOTE — Consult Note (Signed)
 Hospital Consult    Reason for Consult:  Dialysis Perma Catheter removal  Requesting Physician:  Dr Rica Chalet MD  MRN #:  [id number]  History of Present Illness: This is a 63 y.o. male Adam Howell is a 63 y.o. male with medical history significant of ESRD-HD (MWF), HTN, HLD, DM, CAD, dCHF, A fib on Eliquis , amenia, chronic pain, s/p of right AKA, who presents with SOB. Patient recently had complicated admission from 5/22 - 5/29.  At that time he presented for an elective AV fistula revision.  During admission he had hypotension with PermCath placement which was concerning for bleeding and possible IV contrast reaction, required intubation and vasopressor support, extubated on 5/26.  Pt was discharged with plans for dialysis using right groin permacatheter.    Upon work up patient was found to be bacteremic and now needs his dialysis perma catheter removed. Vascular surgery was consulted to remove it.   Past Medical History:  Diagnosis Date   Anemia in chronic kidney disease    Cellulitis of left lower extremity    Chronic atrial fibrillation (HCC)    Chronic kidney disease    Congestive heart failure (HCC)    Difficult intubation    End-stage renal disease on hemodialysis (HCC)    Hyperlipidemia    Hypertension    Type 2 diabetes mellitus with complication [facility])     Past Surgical History:  Procedure Laterality Date   A/V FISTULAGRAM Left 08/27/2022   Procedure: A/V Fistulagram;  Surgeon: Celso College, MD;  Location: [facility];  Service: Cardiovascular;  Laterality: Left;   A/V FISTULAGRAM Left 08/12/2023   Procedure: A/V Fistulagram;  Surgeon: Celso College, MD;  Location: [facility];  Service: Cardiovascular;  Laterality: Left;   AMPUTATION Right 03/13/2022   Procedure: AMPUTATION ABOVE KNEE;  Surgeon: Celso College, MD;  Location: [facility] OR[facility];  Service: General;  Laterality: Right;   APPLICATION OF WOUND VAC Right 03/07/2022   Procedure: APPLICATION OF  WOUND VAC;  Surgeon: Lesta Rater, MD;  Location: [facility] OR[facility];  Service: Vascular;  Laterality: Right;  GAAC [id number]   CENTRAL LINE INSERTION Left 08/05/2023   Procedure: CENTRAL LINE INSERTION;  Surgeon: Celso College, MD;  Location: [facility];  Service: Vascular;  Laterality: Left;   DIALYSIS/PERMA CATHETER INSERTION Right 08/05/2023   Procedure: DIALYSIS/PERMA CATHETER INSERTION;  Surgeon: Celso College, MD;  Location: [facility];  Service: Vascular;  Laterality: Right;   GROIN DISSECTION Right 08/05/2023   Procedure: right groin venogram, angioplasty of superior venacava and anomic vein;  Surgeon: Celso College, MD;  Location: [facility];  Service: Vascular;  Laterality: Right;   LOWER EXTREMITY ANGIOGRAPHY Left 02/15/2023   Procedure: Lower Extremity Angiography;  Surgeon: Celso College, MD;  Location: [facility];  Service: Cardiovascular;  Laterality: Left;   REVISON OF ARTERIOVENOUS FISTULA Left 08/05/2023   Procedure: REVISON OF ARTERIOVENOUS FISTULA;  Surgeon: Celso College, MD;  Location: [facility] OR[facility];  Service: Vascular;  Laterality: Left;  JUMP GRAFT   WOUND DEBRIDEMENT Left    WOUND DEBRIDEMENT Right 01/16/2022   Procedure: DEBRIDEMENT WOUND;  Surgeon: Jackquelyn Mass, MD;  Location: [facility];  Service: Vascular;  Laterality: Right;  Wound vac placement   WOUND DEBRIDEMENT Right 03/07/2022   Procedure: DEBRIDEMENT WOUND RIGHT LOWER EXTREMITY;  Surgeon: Lesta Rater, MD;  Location: [facility];  Service: Vascular;  Laterality: Right;    Allergies  Allergen Reactions   Ivp  Dye [Iodinated Contrast Media] Anaphylaxis   Tramadol  Rash    Prior to Admission medications   Medication Sig Start Date End Date Taking? Authorizing Provider  acetaminophen  (TYLENOL ) 325 MG tablet Take 2 tablets (650 mg total) by mouth every 6 (six) hours as needed for mild pain (pain score 1-3), fever or headache (or Fever >/= 101). 03/04/23  Yes Althia Atlas, MD  amiodarone  (PACERONE ) 200 MG tablet Take 1 tablet  (200 mg total) by mouth 2 (two) times daily. 10/12/22  Yes Cannady, Jolene T, NP  aspirin  EC 81 MG tablet Take 1 tablet (81 mg total) by mouth daily. Swallow whole. 03/05/23  Yes Althia Atlas, MD  carvedilol  (COREG ) 6.25 MG tablet Take 6.25 mg by mouth daily. 06/25/23  Yes [provider]  ELIQUIS  2.5 MG TABS tablet Take 1 tablet (2.5 mg total) by mouth 2 (two) times daily. 10/12/22  Yes Cannady, Jolene T, NP  iron  polysaccharides (NIFEREX) 150 MG capsule Take 1 capsule (150 mg total) by mouth daily. 03/05/23  Yes Althia Atlas, MD  midodrine  (PROAMATINE ) 5 MG tablet Take 5 mg by mouth as needed (post dialysis hypotension SBP<90 or DBP<55).   Yes [provider]  ondansetron  (ZOFRAN ) 4 MG tablet Take 4 mg by mouth every 6 (six) hours as needed for nausea or vomiting. 08/01/23  Yes [provider]  oxyCODONE -acetaminophen  (PERCOCET/ROXICET) 5-325 MG tablet Take 1-2 tablets by mouth every 4 (four) hours as needed for moderate pain (pain score 4-6). 08/16/23  Yes Shalina Norfolk R, NP  rosuvastatin  (CRESTOR ) 40 MG tablet Take 1 tablet (40 mg total) by mouth daily. 10/12/22  Yes Cannady, Jolene T, NP  sevelamer  carbonate (RENVELA ) 800 MG tablet Take 2,400 mg by mouth 3 (three) times daily. 09/21/22  Yes [provider]  traZODone  (DESYREL ) 100 MG tablet Take 1 tablet (100 mg total) by mouth at bedtime. 01/12/23  Yes Cannady, Jolene T, NP  melatonin 3 MG TABS tablet Take 3 mg by mouth at bedtime. Patient not taking: Reported on 08/29/2023    [provider]  SANTYL  250 UNIT/GM ointment Apply 1 Application topically daily. Patient not taking: Reported on 08/29/2023 02/08/23   [provider]  senna (SENOKOT) 8.6 MG TABS tablet Take 2 tablets (17.2 mg total) by mouth at bedtime. Patient not taking: Reported on 08/29/2023 04/03/22   Love, Renay Carota, PA-C    Social History   Socioeconomic History   Marital status: Divorced    Spouse name: Burak Zerbe   Number  of children: 4   Years of education: Not on file   Highest education level: Not on file  Occupational History   Not on file  Tobacco Use   Smoking status: Never   Smokeless tobacco: Never  Vaping Use   Vaping status: Never Used  Substance and Sexual Activity   Alcohol use: Not Currently   Drug use: Never   Sexual activity: Not Currently  Other Topics Concern   Not on file  Social History Narrative   Lives with daughter Moira Andrews.     Social Drivers of Corporate investment banker Strain: Low Risk  (11/25/2021)   Overall Financial Resource Strain (CARDIA)    Difficulty of Paying Living Expenses: Not hard at all  Food Insecurity: No Food Insecurity (08/31/2023)   Hunger Vital Sign    Worried About Running Out of Food in the Last Year: Never true    Ran Out of Food in the Last Year: Never true  Transportation Needs:  No Transportation Needs (08/31/2023)   PRAPARE - Administrator, Civil Service (Medical): No    Lack of Transportation (Non-Medical): No  Physical Activity: Insufficiently Active (11/25/2021)   Exercise Vital Sign    Days of Exercise per Week: 2 days    Minutes of Exercise per Session: 60 min  Stress: No Stress Concern Present (11/25/2021)   [employer] of Occupational Health - Occupational Stress Questionnaire    Feeling of Stress : Not at all  Social Connections: Moderately Isolated (12/29/2022)   Social Connection and Isolation Panel    Frequency of Communication with Friends and Family: More than three times a week    Frequency of Social Gatherings with Friends and Family: Three times a week    Attends Religious Services: 1 to 4 times per year    Active Member of Clubs or Organizations: No    Attends Banker Meetings: Never    Marital Status: Never married  Intimate Partner Violence: Not At Risk (08/31/2023)   Humiliation, Afraid, Rape, and Kick questionnaire    Fear of Current or Ex-Partner: No    Emotionally Abused: No     Physically Abused: No    Sexually Abused: No     Family History  Problem Relation Age of Onset   Heart failure Mother    Cirrhosis Father    Hypertension Daughter     ROS: Otherwise negative unless mentioned in HPI  Physical Examination  Vitals:   08/30/23 2143 08/31/23 0235  BP: (!) 96/47 118/78  Pulse: 98 (!) 56  Resp: 20 20  Temp: 99.4 F (37.4 C) 98.4 F (36.9 C)  SpO2: 99% 100%   Body mass index is 26.92 kg/m.  General:  WDWN in NAD Gait: Not observed HENT: WNL, normocephalic Pulmonary: normal non-labored breathing, without Rales, rhonchi,  wheezing Cardiac: regular, without  Murmurs, rubs or gallops; without carotid bruits Abdomen: Positive bowel sounds throughout, soft, NT/ND, no masses Skin: without rashes Vascular Exam/Pulses: Palpable pulses throughout. All extremities are warm to touch.  Extremities: without ischemic changes, without Gangrene , without cellulitis; with open wounds;  Musculoskeletal: no muscle wasting or atrophy  Neurologic: A&O X 3;  No focal weakness or paresthesias are detected; speech is fluent/normal Psychiatric:  The pt has Normal affect. Lymph:  Unremarkable  CBC    Component Value Date/Time   WBC 5.2 08/31/2023 0347   RBC 3.10 (L) 08/31/2023 0347   HGB 8.7 (L) 08/31/2023 0347   HGB 16.8 01/12/2023 1414   HCT 28.5 (L) 08/31/2023 0347   HCT 51.0 01/12/2023 1414   PLT 88 (L) 08/31/2023 0347   PLT 257 01/12/2023 1414   MCV 91.9 08/31/2023 0347   MCV 88 01/12/2023 1414   MCH 28.1 08/31/2023 0347   MCHC 30.5 08/31/2023 0347   RDW 18.9 (H) 08/31/2023 0347   RDW 14.8 01/12/2023 1414   LYMPHSABS 0.7 08/29/2023 1958   LYMPHSABS 1.0 01/12/2023 1414   MONOABS 0.5 08/29/2023 1958   EOSABS 0.2 08/29/2023 1958   EOSABS 0.2 01/12/2023 1414   BASOSABS 0.0 08/29/2023 1958   BASOSABS 0.1 01/12/2023 1414    BMET    Component Value Date/Time   NA 133 (L) 08/31/2023 0347   NA 136 04/14/2023 1610   K 4.3 08/31/2023 0347   CL 95  (L) 08/31/2023 0347   CO2 25 08/31/2023 0347   GLUCOSE 87 08/31/2023 0347   BUN 32 (H) 08/31/2023 0347   BUN 18 04/14/2023 1610   CREATININE  7.18 (H) 08/31/2023 0347   CALCIUM  8.0 (L) 08/31/2023 0347   GFRNONAA 8 (L) 08/31/2023 0347    COAGS: Lab Results  Component Value Date   INR 1.3 (H) 08/05/2023   INR 1.2 03/05/2022     Non-Invasive Vascular Imaging:   None Ordered   Statin:  Yes.   Beta Blocker:  Yes.   Aspirin :  Yes.   ACEI:  No. ARB:  No. CCB use:  No Other antiplatelets/anticoagulants:  No.    ASSESSMENT/PLAN: This is a 63 y.o. male who presents to [facility] emergency department with shortness of breath.  He is a known dialysis patient to vascular surgery.  Upon workup he was found to be bacteremic.  Vascular surgery was consulted to remove the patient's dialysis access permacatheter.  I discussed in detail with the patient the procedure, benefits, risk, complications.  Patient verbalized understanding wishes to proceed.  Answered all his questions this morning.  Patient does not need to be n.p.o. for this procedure.  -I discussed the case with Dr. Devon Fogo MD and he agrees with plan.   Annamaria Barrette [facility] 08/31/2023 7:22 AM

## 2023-08-31 NOTE — Plan of Care (Signed)
°  Problem: Activity: °Goal: Ability to tolerate increased activity will improve °Outcome: Progressing °  °Problem: Respiratory: °Goal: Ability to maintain adequate ventilation will improve °Outcome: Progressing °Goal: Ability to maintain a clear airway will improve °Outcome: Progressing °  °

## 2023-08-31 NOTE — Progress Notes (Signed)
 Upon assessment it was noted that there was one stitch to right chest. Patient cannot remember why it was there. Called his daughter, Moira Andrews. Per daughter patient had a chest tube last admission. Per Dr. Broadus Canes one stitch was removed. Patient tolerated well.

## 2023-08-31 NOTE — Progress Notes (Addendum)
 PROGRESS NOTE   HPI was taken from Dr. Rosalea Collin: Adam Howell is a 63 y.o. male with medical history significant of ESRD-HD (MWF), HTN, HLD, DM, CAD, dCHF, A fib on Eliquis , amenia, chronic pain, s/p of right AKA, who presents with SOB.   Patient recently had complicated admission from 5/22 - 5/29.  At that time he presented for an elective AV fistula revision.  During admission he had hypotension with PermCath placement which was concerning for bleeding and possible IV contrast reaction, required intubation and vasopressor support, extubated on 5/26.  Pt was discharged with plans for dialysis using right groin permacatheter.     Patient states that he has dry cough congestion and SOB in the past 3 days, which has been progressively worsening.  No chest pain, fever or chills.  No nausea, vomiting, diarrhea or abdominal pain.  No symptoms of UTI.  Denies dark stool or rectal bleeding.  Patient had dialysis on Friday.    Data reviewed independently and ED Course: pt was found to have pancytopenia with WBC 3.8, hemoglobin 7.4 and platelet 95 (patient had WBC 10.0, hemoglobin 8.0 and platelet 215 on 08/16/2023), negative PCR for COVID, flu and RSV, troponin 107, potassium 4.4, bicarbonate 25, creatinine 9.88, BUN 41.  Temperature normal, blood pressure 98/61, heart rate 59, RR 24, oxygen saturation 95% on room air, which improved to 100% on 2 L oxygen.  Patient is admitted to telemetry bed as inpatient.   CT of chest: 1. Scattered bilateral nodular ground-glass airspace disease, greatest in the lower lobes, favor bilateral pneumonia given clinical presentation. 2. Cardiomegaly. 3. Borderline mediastinal adenopathy, likely reactive. 4. Aortic Atherosclerosis (ICD10-I70.0). Coronary artery atherosclerosis.      Paul Trettin  JXB:[id number] DOB: [birth year] DOA: 08/29/2023 PCP: Lemar Pyles, NP   Assessment & Plan:   Principal Problem:   HCAP (healthcare-associated pneumonia) Active  Problems:   Aspiration pneumonia (HCC)   CAD (coronary artery disease)   Myocardial injury   ESRD on dialysis [facility])   Atrial fibrillation, chronic (HCC)   Type 2 diabetes mellitus with ESRD (end-stage renal disease) (HCC)   Hyperlipidemia associated with type 2 diabetes mellitus (HCC)   Chronic diastolic CHF (congestive heart failure) (HCC)   Chronic pain syndrome   Pancytopenia (HCC)   Anemia in ESRD (end-stage renal disease) (HCC)   Overweight (BMI 25.0-29.9)   Encounter for dialysis catheter care [facility])  Assessment and Plan: HCAP  vs. aspiration pneumonia: Does not meet criteria for sepsis. Continue on IV zosyn , vanco, bronchodilators & encourage incentive spirometry. Legionella, strep ordered but has not been collected yet & discussed w/ nurse on 08/30/23 and was told pt still makes some urine, so may not be able to get an appropriate sample    MRSA bacteremia: source is unclear, possible HD catheter in R groin vs previous AVF site. S/p HD catheter removed by vasc surg on 08/31/23. Blood cxs growing staph aureus & gram positive rods, sens pending. Continue on IV zosyn , vanco as per ID. Echo ordered. ID following and recs apprec   Hx of CAD & myocardial injury: trop  107, no chest pain.  Likely due to demand ischemia versus decreased clearance of troponin in the setting of ESRD. Continue on aspirin , statin   Left shin wound: present on admission. Wound care consulted   ESRD: on HD MWF. Nephro following and recs   Likely PVD: w/ hx of right AKA. Uses a wheelchair. Continue w/ supportive care    Chronic a. fib:  continue on amio, coreg , hold for MAP and/or HR <65. Continue on eliquis     DM2: Recent A1c 6.0, well-controlled. No need for insulin  currently    HLD: continue on statin   Chronic diastolic CHF: volume/fluid management w/ HD    Chronic pain syndrome: continue on home dose of percocet    Bicytopenia: w/ likely ACD secondary to ESRD. S/p 1 unit of pRBCs transfuse so far.  Repeat H&H are trening up. Platelets are labile.   Overweight: BMI 29.3. Would benefit from weight loss       DVT prophylaxis: eliquis  Code Status: full  Family Communication: discussed pt's care w/ pt's daughter, Moira Andrews, and answered her questions  Disposition Plan: depends on PT/OT recs.  Level of care: Telemetry Medical  Status is: Inpatient Remains inpatient appropriate because: severity of illness, requiring IV abxs    Consultants:  ID Nephro   Procedures:   Antimicrobials: zosyn , vanco    Subjective: Pt c/o fatigue   Objective: Vitals:   08/30/23 2143 08/31/23 0235 08/31/23 0808 08/31/23 0834  BP: (!) 96/47 118/78 93/60 (!) 106/54  Pulse: 98 (!) 56 62 64  Resp: 20 20 16 20   Temp: 99.4 F (37.4 C) 98.4 F (36.9 C) 98.5 F (36.9 C) 99.4 F (37.4 C)  TempSrc: Oral Oral Oral Oral  SpO2: 99% 100% 98% 98%  Weight:  97.7 kg    Height:        Intake/Output Summary (Last 24 hours) at 08/31/2023 0836 Last data filed at 08/31/2023 0331 Gross per 24 hour  Intake 879.5 ml  Output 2000 ml  Net -1120.5 ml   Filed Weights   08/29/23 1958 08/31/23 0235  Weight: 106.6 kg 97.7 kg    Examination:  General exam: appears lethargic  Respiratory system: diminished breath sounds b/l   Cardiovascular system: S1 & S2+. No rubs, gallops or clicks.  Gastrointestinal system: Abdomen is nondistended, soft and nontender. Normal bowel sounds heard. Central nervous system: lethargic  Psychiatry: Judgement and insight appears not at baseline. Flat mood and affect    Data Reviewed: I have personally reviewed following labs and imaging studies  CBC: Recent Labs  Lab 08/29/23 1958 08/30/23 0405 08/30/23 1743 08/31/23 0347  WBC 3.8* 4.3  --  5.2  NEUTROABS 2.4  --   --   --   HGB 7.4* 7.0* 8.8* 8.7*  HCT 25.1* 23.7* 28.9* 28.5*  MCV 94.7 94.4  --  91.9  PLT 95* 99*  --  88*   Basic Metabolic Panel: Recent Labs  Lab 08/29/23 1958 08/30/23 0405 08/31/23 0347   NA 138 138 133*  K 4.4 4.7 4.3  CL 98 94* 95*  CO2 25 31 25   GLUCOSE 97 94 87  BUN 41* 47* 32*  CREATININE 9.88* 10.35* 7.18*  CALCIUM  8.3* 8.1* 8.0*   GFR: Estimated Creatinine Clearance: 12.7 mL/min (A) (by C-G formula based on SCr of 7.18 mg/dL (H)). Liver Function Tests: Recent Labs  Lab 08/29/23 1958  AST 22  ALT 13  ALKPHOS 69  BILITOT 0.9  PROT 6.7  ALBUMIN  2.7*   No results for input(s): LIPASE, AMYLASE in the last 168 hours. No results for input(s): AMMONIA in the last 168 hours. Coagulation Profile: No results for input(s): INR, PROTIME in the last 168 hours. Cardiac Enzymes: No results for input(s): CKTOTAL, CKMB, CKMBINDEX, TROPONINI in the last 168 hours. BNP (last 3 results) No results for input(s): PROBNP in the last 8760 hours. HbA1C: No results for input(s): HGBA1C  in the last 72 hours. CBG: No results for input(s): GLUCAP in the last 168 hours. Lipid Profile: No results for input(s): CHOL, HDL, LDLCALC, TRIG, CHOLHDL, LDLDIRECT in the last 72 hours. Thyroid  Function Tests: No results for input(s): TSH, T4TOTAL, FREET4, T3FREE, THYROIDAB in the last 72 hours. Anemia Panel: No results for input(s): VITAMINB12, FOLATE, FERRITIN, TIBC, IRON , RETICCTPCT in the last 72 hours. Sepsis Labs: No results for input(s): PROCALCITON, LATICACIDVEN in the last 168 hours.  Recent Results (from the past 240 hours)  Resp panel by RT-PCR (RSV, Flu A&B, Covid) Anterior Nasal Swab     Status: None   Collection Time: 08/29/23  8:00 PM   Specimen: Anterior Nasal Swab  Result Value Ref Range Status   SARS Coronavirus 2 by RT PCR NEGATIVE NEGATIVE Final    Comment: (NOTE) SARS-CoV-2 target nucleic acids are NOT DETECTED.  The SARS-CoV-2 RNA is generally detectable in upper respiratory specimens during the acute phase of infection. The lowest concentration of SARS-CoV-2 viral copies this assay can detect  is 138 copies/mL. A negative result does not preclude SARS-Cov-2 infection and should not be used as the sole basis for treatment or other patient management decisions. A negative result may occur with  improper specimen collection/handling, submission of specimen other than nasopharyngeal swab, presence of viral mutation(s) within the areas targeted by this assay, and inadequate number of viral copies(<138 copies/mL). A negative result must be combined with clinical observations, patient history, and epidemiological information. The expected result is Negative.  Fact Sheet for Patients:  BloggerCourse.com  Fact Sheet for Healthcare Providers:  SeriousBroker.it  This test is no t yet approved or cleared by the [place]  FDA and  has been authorized for detection and/or diagnosis of SARS-CoV-2 by FDA under an Emergency Use Authorization (EUA). This EUA will remain  in effect (meaning this test can be used) for the duration of the COVID-19 declaration under Section 564(b)(1) of the Act, 21 U.S.C.section 360bbb-3(b)(1), unless the authorization is terminated  or revoked sooner.       Influenza A by PCR NEGATIVE NEGATIVE Final   Influenza B by PCR NEGATIVE NEGATIVE Final    Comment: (NOTE) The Xpert Xpress SARS-CoV-2/FLU/RSV plus assay is intended as an aid in the diagnosis of influenza from Nasopharyngeal swab specimens and should not be used as a sole basis for treatment. Nasal washings and aspirates are unacceptable for Xpert Xpress SARS-CoV-2/FLU/RSV testing.  Fact Sheet for Patients: BloggerCourse.com  Fact Sheet for Healthcare Providers: SeriousBroker.it  This test is not yet approved or cleared by the [place]  FDA and has been authorized for detection and/or diagnosis of SARS-CoV-2 by FDA under an Emergency Use Authorization (EUA). This EUA will remain in effect  (meaning this test can be used) for the duration of the COVID-19 declaration under Section 564(b)(1) of the Act, 21 U.S.C. section 360bbb-3(b)(1), unless the authorization is terminated or revoked.     Resp Syncytial Virus by PCR NEGATIVE NEGATIVE Final    Comment: (NOTE) Fact Sheet for Patients: BloggerCourse.com  Fact Sheet for Healthcare Providers: SeriousBroker.it  This test is not yet approved or cleared by the [place]  FDA and has been authorized for detection and/or diagnosis of SARS-CoV-2 by FDA under an Emergency Use Authorization (EUA). This EUA will remain in effect (meaning this test can be used) for the duration of the COVID-19 declaration under Section 564(b)(1) of the Act, 21 U.S.C. section 360bbb-3(b)(1), unless the authorization is terminated or revoked.  Performed at [facility],  [address] [place], [place] [address]   Blood Culture (routine x 2)     Status: None (Preliminary result)   Collection Time: 08/30/23 12:06 AM   Specimen: BLOOD RIGHT HAND  Result Value Ref Range Status   Specimen Description   Final    BLOOD RIGHT HAND Performed at [facility], [address], [place], [place] [address]    Special Requests   Final    BOTTLES DRAWN AEROBIC AND ANAEROBIC Blood Culture results may not be optimal due to an inadequate volume of blood received in culture bottles Performed at [facility], [address] [place], [place] [address]    Culture  Setup Time   Final    GRAM POSITIVE COCCI IN BOTH AEROBIC AND ANAEROBIC BOTTLES CRITICAL RESULT CALLED TO, READ BACK BY AND VERIFIED WITH: Marguerite Shiley 08/30/23 1230 MW Performed at [facility], [address], [place], [place] [address]    Culture GRAM POSITIVE COCCI  Final   Report Status PENDING  Incomplete  Blood Culture (routine x 2)     Status: None (Preliminary result)   Collection Time: 08/30/23 12:06  AM   Specimen: BLOOD RIGHT ARM  Result Value Ref Range Status   Specimen Description   Final    BLOOD RIGHT ARM Performed at [facility], [address] [place], [place] [address]    Special Requests   Final    BOTTLES DRAWN AEROBIC AND ANAEROBIC Blood Culture results may not be optimal due to an inadequate volume of blood received in culture bottles Performed at [facility], [address] [place], [place] [address]    Culture  Setup Time   Final    GRAM POSITIVE COCCI IN BOTH AEROBIC AND ANAEROBIC BOTTLES CRITICAL RESULT CALLED TO, READ BACK BY AND VERIFIED WITH: TREY GREENWOOD 08/30/23 1230 MW GRAM POSITIVE RODS ANAEROBIC BOTTLE ONLY CRITICAL RESULT CALLED TO, READ BACK BY AND VERIFIED WITH: PHARMD W. ANDERSON [id number] @1700  FH Performed at [facility], [address], [place], [place] [address]    Culture GRAM POSITIVE COCCI GRAM POSITIVE RODS   Final   Report Status PENDING  Incomplete  Blood Culture ID Panel (Reflexed)     Status: Abnormal   Collection Time: 08/30/23 12:06 AM  Result Value Ref Range Status   Enterococcus faecalis NOT DETECTED NOT DETECTED Final   Enterococcus Faecium NOT DETECTED NOT DETECTED Final   Listeria monocytogenes NOT DETECTED NOT DETECTED Final   Staphylococcus species DETECTED (A) NOT DETECTED Final    Comment: CRITICAL RESULT CALLED TO, READ BACK BY AND VERIFIED WITH: TREY GREENWOOD 08/30/23 1230 MW    Staphylococcus aureus (BCID) DETECTED (A) NOT DETECTED Final    Comment: Methicillin (oxacillin)-resistant Staphylococcus aureus (MRSA). MRSA is predictably resistant to beta-lactam antibiotics (except ceftaroline). Preferred therapy is vancomycin  unless clinically contraindicated. Patient requires contact precautions if  hospitalized. CRITICAL RESULT CALLED TO, READ BACK BY AND VERIFIED WITH: TREY GREENWOOD 08/30/23 1230 MW    Staphylococcus epidermidis NOT DETECTED NOT DETECTED Final   Staphylococcus lugdunensis NOT  DETECTED NOT DETECTED Final   Streptococcus species NOT DETECTED NOT DETECTED Final   Streptococcus agalactiae NOT DETECTED NOT DETECTED Final   Streptococcus pneumoniae NOT DETECTED NOT DETECTED Final   Streptococcus pyogenes NOT DETECTED NOT DETECTED Final   A.calcoaceticus-baumannii NOT DETECTED NOT DETECTED Final   Bacteroides fragilis NOT DETECTED NOT DETECTED Final   Enterobacterales NOT DETECTED NOT DETECTED Final   Enterobacter cloacae complex NOT DETECTED  NOT DETECTED Final   Escherichia coli NOT DETECTED NOT DETECTED Final   Klebsiella aerogenes NOT DETECTED NOT DETECTED Final   Klebsiella oxytoca NOT DETECTED NOT DETECTED Final   Klebsiella pneumoniae NOT DETECTED NOT DETECTED Final   Proteus species NOT DETECTED NOT DETECTED Final   Salmonella species NOT DETECTED NOT DETECTED Final   Serratia marcescens NOT DETECTED NOT DETECTED Final   Haemophilus influenzae NOT DETECTED NOT DETECTED Final   Neisseria meningitidis NOT DETECTED NOT DETECTED Final   Pseudomonas aeruginosa NOT DETECTED NOT DETECTED Final   Stenotrophomonas maltophilia NOT DETECTED NOT DETECTED Final   Candida albicans NOT DETECTED NOT DETECTED Final   Candida auris NOT DETECTED NOT DETECTED Final   Candida glabrata NOT DETECTED NOT DETECTED Final   Candida krusei NOT DETECTED NOT DETECTED Final   Candida parapsilosis NOT DETECTED NOT DETECTED Final   Candida tropicalis NOT DETECTED NOT DETECTED Final   Cryptococcus neoformans/gattii NOT DETECTED NOT DETECTED Final   Meth resistant mecA/C and MREJ DETECTED (A) NOT DETECTED Final    Comment: CRITICAL RESULT CALLED TO, READ BACK BY AND VERIFIED WITH: Marguerite Shiley 08/30/23 1230 MW Performed at [facility], [address] [place], [place] [address]   MRSA Next Gen by PCR, Nasal     Status: Abnormal   Collection Time: 08/30/23  2:53 PM   Specimen: Nasal Mucosa; Nasal Swab  Result Value Ref Range Status   MRSA by PCR Next Gen DETECTED (A) NOT  DETECTED Final    Comment: CRITICAL RESULT CALLED TO, READ BACK BY AND VERIFIED WITH:  Lorayne Rocks RN 08/30/2023 @1624  KKG (NOTE) The GeneXpert MRSA Assay (FDA approved for NASAL specimens only), is one component of a comprehensive MRSA colonization surveillance program. It is not intended to diagnose MRSA infection nor to guide or monitor treatment for MRSA infections. Test performance is not FDA approved in patients less than 24 years old. Performed at [facility], [address], [place], [place] [address]          Radiology Studies: CT Chest Wo Contrast Result Date: 08/29/2023 CLINICAL DATA:  Respiratory illness, nondiagnostic xray recent hemothorax, new respiratory symptoms, short of breath, recent history of sepsis EXAM: CT CHEST WITHOUT CONTRAST TECHNIQUE: Multidetector CT imaging of the chest was performed following the standard protocol without IV contrast. RADIATION DOSE REDUCTION: This exam was performed according to the departmental dose-optimization program which includes automated exposure control, adjustment of the mA and/or kV according to patient size and/or use of iterative reconstruction technique. COMPARISON:  08/11/2023, 08/05/2023 FINDINGS: Cardiovascular: Unenhanced imaging of the heart demonstrates stable cardiomegaly without pericardial effusion. Normal caliber of the thoracic aorta. Atherosclerosis of the aorta and coronary vasculature. Mediastinum/Nodes: Borderline enlarged mediastinal lymph nodes, largest measuring 10 mm in the right paratracheal region. Thyroid , trachea, and esophagus are unremarkable. Lungs/Pleura: Scattered nodular ground-glass airspace disease is seen bilaterally, greatest within the lower lobes. Findings are consistent with infection, aspiration, or hemorrhage. No effusion or pneumothorax. The central airways are patent. Upper Abdomen: Central venous catheter from an inferior approach, tip at the atriocaval junction. No acute upper  abdominal findings. Musculoskeletal: No acute or destructive bony abnormalities. Reconstructed images demonstrate no additional findings. IMPRESSION: 1. Scattered bilateral nodular ground-glass airspace disease, greatest in the lower lobes, favor bilateral pneumonia given clinical presentation. 2. Cardiomegaly. 3. Borderline mediastinal adenopathy, likely reactive. 4. Aortic Atherosclerosis (ICD10-I70.0). Coronary artery atherosclerosis. Electronically Signed   By: Bobbye Burrow M.D.   On: 08/29/2023 20:38  Scheduled Meds:  sodium chloride    Intravenous Once   amiodarone   200 mg Oral BID   apixaban   2.5 mg Oral BID   aspirin  EC  81 mg Oral Daily   carvedilol   6.25 mg Oral Daily   Chlorhexidine  Gluconate Cloth  6 each Topical Q0600   [START ON 09/01/2023] epoetin  alfa-epbx (RETACRIT ) injection  10,000 Units Intravenous Q M,W,F-1800   iron  polysaccharides  150 mg Oral Daily   leptospermum manuka honey  1 Application Topical Daily   rosuvastatin   40 mg Oral Daily   sevelamer  carbonate  2,400 mg Oral TID WC   traZODone   100 mg Oral QHS   Continuous Infusions:  sodium chloride      piperacillin -tazobactam (ZOSYN )  IV 2.25 g (08/31/23 0636)   vancomycin  Stopped (08/30/23 1413)     LOS: 2 days     Alphonsus Jeans, MD [facility] Pager [phone]  If 7PM-7AM, please contact night-coverage [email or web address] 08/31/2023, 8:36 AM

## 2023-09-01 ENCOUNTER — Ambulatory Visit (INDEPENDENT_AMBULATORY_CARE_PROVIDER_SITE_OTHER): Admitting: Nurse Practitioner

## 2023-09-01 ENCOUNTER — Inpatient Hospital Stay: Admitting: Anesthesiology

## 2023-09-01 ENCOUNTER — Encounter (INDEPENDENT_AMBULATORY_CARE_PROVIDER_SITE_OTHER)

## 2023-09-01 ENCOUNTER — Telehealth: Payer: Self-pay

## 2023-09-01 ENCOUNTER — Inpatient Hospital Stay (HOSPITAL_COMMUNITY)
Admit: 2023-09-01 | Discharge: 2023-09-01 | Disposition: A | Discharge status: Home / Self Care | Attending: Internal Medicine

## 2023-09-01 ENCOUNTER — Encounter
Admission: EM | Disposition: A | Discharge status: Home Health | Payer: Self-pay | Source: Home / Self Care | Attending: Student in an Organized Health Care Education/Training Program

## 2023-09-01 DIAGNOSIS — R7881 Bacteremia: Secondary | ICD-10-CM | POA: Diagnosis not present

## 2023-09-01 DIAGNOSIS — T827XXA Infection and inflammatory reaction due to other cardiac and vascular devices, implants and grafts, initial encounter: Principal | ICD-10-CM

## 2023-09-01 DIAGNOSIS — Z992 Dependence on renal dialysis: Secondary | ICD-10-CM | POA: Diagnosis not present

## 2023-09-01 DIAGNOSIS — N186 End stage renal disease: Secondary | ICD-10-CM | POA: Diagnosis not present

## 2023-09-01 DIAGNOSIS — T82868A Thrombosis of vascular prosthetic devices, implants and grafts, initial encounter: Secondary | ICD-10-CM | POA: Diagnosis not present

## 2023-09-01 DIAGNOSIS — J189 Pneumonia, unspecified organism: Secondary | ICD-10-CM | POA: Diagnosis not present

## 2023-09-01 DIAGNOSIS — B9562 Methicillin resistant Staphylococcus aureus infection as the cause of diseases classified elsewhere: Secondary | ICD-10-CM | POA: Diagnosis not present

## 2023-09-01 HISTORY — PX: REVISON OF ARTERIOVENOUS FISTULA: SHX6074

## 2023-09-01 LAB — GLUCOSE, CAPILLARY: Glucose-Capillary: 96 mg/dL (ref 70–99)

## 2023-09-01 LAB — CULTURE, BLOOD (ROUTINE X 2)

## 2023-09-01 LAB — BASIC METABOLIC PANEL WITH GFR
Anion gap: 12 (ref 5–15)
BUN: 41 mg/dL — ABNORMAL HIGH (ref 8–23)
CO2: 27 mmol/L (ref 22–32)
Calcium: 8.2 mg/dL — ABNORMAL LOW (ref 8.9–10.3)
Chloride: 94 mmol/L — ABNORMAL LOW (ref 98–111)
Creatinine, Ser: 9.14 mg/dL — ABNORMAL HIGH (ref 0.61–1.24)
GFR, Estimated: 6 mL/min — ABNORMAL LOW (ref >60)
Glucose, Bld: 100 mg/dL — ABNORMAL HIGH (ref 70–99)
Potassium: 4.3 mmol/L (ref 3.5–5.1)
Sodium: 133 mmol/L — ABNORMAL LOW (ref 135–145)

## 2023-09-01 LAB — CBC
HCT: 26.4 % — ABNORMAL LOW (ref 39.0–52.0)
Hemoglobin: 8 g/dL — ABNORMAL LOW (ref 13.0–17.0)
MCH: 27.5 pg (ref 26.0–34.0)
MCHC: 30.3 g/dL (ref 30.0–36.0)
MCV: 90.7 fL (ref 80.0–100.0)
Platelets: 91 10*3/uL — ABNORMAL LOW (ref 150–400)
RBC: 2.91 MIL/uL — ABNORMAL LOW (ref 4.22–5.81)
RDW: 18.5 % — ABNORMAL HIGH (ref 11.5–15.5)
WBC: 4.5 10*3/uL (ref 4.0–10.5)
nRBC: 0 % (ref 0.0–0.2)

## 2023-09-01 LAB — ECHOCARDIOGRAM LIMITED
Area-P 1/2: 4.24 cm2
Height: 75 in
S' Lateral: 2.7 cm
Weight: 3446.23 [oz_av]

## 2023-09-01 SURGERY — REVISON OF ARTERIOVENOUS FISTULA
Anesthesia: General | Site: Arm Lower | Laterality: Left

## 2023-09-01 MED ORDER — VANCOMYCIN HCL IN DEXTROSE 1-5 GM/200ML-% IV SOLN
1000.0000 mg | INTRAVENOUS | Status: DC | PRN
  Administered 2023-09-03: 1000 mg via INTRAVENOUS
  Filled 2023-09-01: qty 200

## 2023-09-01 MED ORDER — MIDAZOLAM HCL 2 MG/2ML IJ SOLN
INTRAMUSCULAR | Status: DC | PRN
  Administered 2023-09-01: 1 mg via INTRAVENOUS

## 2023-09-01 MED ORDER — ONDANSETRON HCL 4 MG/2ML IJ SOLN
INTRAMUSCULAR | Status: AC
  Filled 2023-09-01: qty 2

## 2023-09-01 MED ORDER — PENTAFLUOROPROP-TETRAFLUOROETH EX AERO: 1.0000 | INHALATION_SPRAY | CUTANEOUS | Status: DC | PRN

## 2023-09-01 MED ORDER — CEFAZOLIN SODIUM-DEXTROSE 2-4 GM/100ML-% IV SOLN
2.0000 g | INTRAVENOUS | Status: AC
  Administered 2023-09-01: 2 g via INTRAVENOUS

## 2023-09-01 MED ORDER — HEPARIN SODIUM (PORCINE) 1000 UNIT/ML DIALYSIS: 1000.0000 [IU] | INTRAMUSCULAR | Status: DC | PRN

## 2023-09-01 MED ORDER — HEPARIN SODIUM (PORCINE) 5000 UNIT/ML IJ SOLN
INTRAMUSCULAR | Status: AC
Start: 2023-09-01 — End: 2023-09-01
  Filled 2023-09-01: qty 1

## 2023-09-01 MED ORDER — NEPRO/CARBSTEADY PO LIQD: 237.0000 mL | ORAL | Status: DC | PRN

## 2023-09-01 MED ORDER — BUPIVACAINE LIPOSOME 1.3 % IJ SUSP
INTRAMUSCULAR | Status: AC
  Filled 2023-09-01: qty 20

## 2023-09-01 MED ORDER — ALBUMIN HUMAN 25 % IV SOLN
12.5000 g | Freq: Once | INTRAVENOUS | Status: AC
  Administered 2023-09-01: 12.5 g via INTRAVENOUS
  Filled 2023-09-01: qty 50

## 2023-09-01 MED ORDER — OXYCODONE HCL 5 MG PO TABS
ORAL_TABLET | ORAL | Status: AC
Start: 2023-09-01 — End: 2023-09-01
  Filled 2023-09-01: qty 1

## 2023-09-01 MED ORDER — FENTANYL CITRATE (PF) 100 MCG/2ML IJ SOLN: 25.0000 ug | INTRAMUSCULAR | Status: DC | PRN

## 2023-09-01 MED ORDER — ONDANSETRON HCL 4 MG/2ML IJ SOLN: 4.0000 mg | Freq: Once | INTRAMUSCULAR | Status: DC | PRN

## 2023-09-01 MED ORDER — LIDOCAINE-PRILOCAINE 2.5-2.5 % EX CREA: 1.0000 | TOPICAL_CREAM | CUTANEOUS | Status: DC | PRN

## 2023-09-01 MED ORDER — ANTICOAGULANT SODIUM CITRATE 4% (200MG/5ML) IV SOLN: 5.0000 mL | Status: DC | PRN

## 2023-09-01 MED ORDER — GLYCOPYRROLATE 0.2 MG/ML IJ SOLN
INTRAMUSCULAR | Status: DC | PRN
  Administered 2023-09-01: .2 mg via INTRAVENOUS

## 2023-09-01 MED ORDER — CHLORHEXIDINE GLUCONATE 4 % EX SOLN: 60.0000 mL | Freq: Once | CUTANEOUS | Status: DC

## 2023-09-01 MED ORDER — LIDOCAINE HCL (CARDIAC) PF 100 MG/5ML IV SOSY
PREFILLED_SYRINGE | INTRAVENOUS | Status: DC | PRN
  Administered 2023-09-01: 100 mg via INTRAVENOUS

## 2023-09-01 MED ORDER — LIDOCAINE HCL (PF) 1 % IJ SOLN: 5.0000 mL | INTRAMUSCULAR | Status: DC | PRN

## 2023-09-01 MED ORDER — ONDANSETRON HCL 4 MG/2ML IJ SOLN
INTRAMUSCULAR | Status: DC | PRN
Start: 2023-09-01 — End: 2023-09-01
  Administered 2023-09-01: 4 mg via INTRAVENOUS

## 2023-09-01 MED ORDER — ANTICOAGULANT SODIUM CITRATE 4% (200MG/5ML) IV SOLN
5.0000 mL | Status: DC | PRN
Start: 2023-09-01 — End: 2023-09-04

## 2023-09-01 MED ORDER — ALTEPLASE 2 MG IJ SOLR: 2.0000 mg | Freq: Once | INTRAMUSCULAR | Status: DC | PRN

## 2023-09-01 MED ORDER — MIDAZOLAM HCL 2 MG/2ML IJ SOLN
INTRAMUSCULAR | Status: AC
  Filled 2023-09-01: qty 2

## 2023-09-01 MED ORDER — VASOPRESSIN 20 UNIT/ML IV SOLN
INTRAVENOUS | Status: DC | PRN
  Administered 2023-09-01 (×4): 2 [IU] via INTRAVENOUS

## 2023-09-01 MED ORDER — SODIUM CHLORIDE 0.9 % IR SOLN
Status: DC | PRN
  Administered 2023-09-01: 501 mL

## 2023-09-01 MED ORDER — PROPOFOL 10 MG/ML IV BOLUS
INTRAVENOUS | Status: AC
Start: 2023-09-01 — End: 2023-09-01
  Filled 2023-09-01: qty 20

## 2023-09-01 MED ORDER — CEFAZOLIN SODIUM-DEXTROSE 2-4 GM/100ML-% IV SOLN
INTRAVENOUS | Status: AC
  Filled 2023-09-01: qty 100

## 2023-09-01 MED ORDER — OXYCODONE HCL 5 MG PO TABS
5.0000 mg | ORAL_TABLET | Freq: Once | ORAL | Status: AC | PRN
  Administered 2023-09-01: 5 mg via ORAL

## 2023-09-01 MED ORDER — EPHEDRINE SULFATE-NACL 50-0.9 MG/10ML-% IV SOSY
PREFILLED_SYRINGE | INTRAVENOUS | Status: DC | PRN
  Administered 2023-09-01 (×2): 10 mg via INTRAVENOUS

## 2023-09-01 MED ORDER — DEXAMETHASONE SODIUM PHOSPHATE 10 MG/ML IJ SOLN
INTRAMUSCULAR | Status: AC
  Filled 2023-09-01: qty 1

## 2023-09-01 MED ORDER — SODIUM CHLORIDE 0.9 % IV SOLN: INTRAVENOUS | Status: DC

## 2023-09-01 MED ORDER — ACETAMINOPHEN 10 MG/ML IV SOLN
INTRAVENOUS | Status: AC
  Filled 2023-09-01: qty 100

## 2023-09-01 MED ORDER — ACETAMINOPHEN 10 MG/ML IV SOLN
1000.0000 mg | Freq: Once | INTRAVENOUS | Status: DC | PRN
  Administered 2023-09-01: 1000 mg via INTRAVENOUS

## 2023-09-01 MED ORDER — SENNOSIDES-DOCUSATE SODIUM 8.6-50 MG PO TABS
2.0000 | ORAL_TABLET | Freq: Once | ORAL | Status: DC
  Filled 2023-09-01: qty 2

## 2023-09-01 MED ORDER — PROPOFOL 10 MG/ML IV BOLUS
INTRAVENOUS | Status: DC | PRN
  Administered 2023-09-01: 100 mg via INTRAVENOUS

## 2023-09-01 MED ORDER — FENTANYL CITRATE (PF) 100 MCG/2ML IJ SOLN
INTRAMUSCULAR | Status: AC
  Filled 2023-09-01: qty 2

## 2023-09-01 MED ORDER — FENTANYL CITRATE (PF) 100 MCG/2ML IJ SOLN
INTRAMUSCULAR | Status: DC | PRN
  Administered 2023-09-01: 25 ug via INTRAVENOUS

## 2023-09-01 MED ORDER — OXYCODONE HCL 5 MG/5ML PO SOLN: 5.0000 mg | Freq: Once | ORAL | Status: AC | PRN

## 2023-09-01 MED ORDER — DEXAMETHASONE SODIUM PHOSPHATE 10 MG/ML IJ SOLN
INTRAMUSCULAR | Status: DC | PRN
  Administered 2023-09-01: 5 mg via INTRAVENOUS

## 2023-09-01 MED ORDER — BUPIVACAINE HCL (PF) 0.5 % IJ SOLN
INTRAMUSCULAR | Status: AC
  Filled 2023-09-01: qty 30

## 2023-09-01 MED ORDER — FENTANYL CITRATE (PF) 100 MCG/2ML IJ SOLN
INTRAMUSCULAR | Status: AC
Start: 2023-09-01 — End: 2023-09-01
  Filled 2023-09-01: qty 2

## 2023-09-01 MED ORDER — LIDOCAINE HCL (PF) 2 % IJ SOLN
INTRAMUSCULAR | Status: AC
  Filled 2023-09-01: qty 5

## 2023-09-01 SURGICAL SUPPLY — 50 items
BAG DECANTER FOR FLEXI CONT (MISCELLANEOUS) ×1 IMPLANT
BLADE SURG SZ11 CARB STEEL (BLADE) ×1 IMPLANT
BNDG GAUZE DERMACEA FLUFF 4 (GAUZE/BANDAGES/DRESSINGS) IMPLANT
BRUSH SCRUB EZ 4% CHG (MISCELLANEOUS) ×1 IMPLANT
CHLORAPREP W/TINT 26 (MISCELLANEOUS) ×1 IMPLANT
CLAMP SUTURE YELLOW 5 PAIRS (MISCELLANEOUS) ×1 IMPLANT
CLIP SPRNG 6 S-JAW DBL (CLIP) ×1 IMPLANT
COVER PROBE FLX POLY STRL (MISCELLANEOUS) IMPLANT
DERMABOND ADVANCED .7 DNX12 (GAUZE/BANDAGES/DRESSINGS) ×1 IMPLANT
DRESSING SURGICEL FIBRLLR 1X2 (HEMOSTASIS) ×1 IMPLANT
DRSG TEGADERM 4X4.75 (GAUZE/BANDAGES/DRESSINGS) IMPLANT
ELECT CAUTERY BLADE 6.4 (BLADE) ×1 IMPLANT
ELECTRODE REM PT RTRN 9FT ADLT (ELECTROSURGICAL) ×1 IMPLANT
GAUZE SPONGE 4X4 12PLY STRL (GAUZE/BANDAGES/DRESSINGS) IMPLANT
GAUZE XEROFORM 1X8 LF (GAUZE/BANDAGES/DRESSINGS) IMPLANT
GLOVE BIO SURGEON STRL SZ7 (GLOVE) ×2 IMPLANT
GOWN STRL REUS W/ TWL LRG LVL3 (GOWN DISPOSABLE) ×2 IMPLANT
GOWN STRL REUS W/TWL 2XL LVL3 (GOWN DISPOSABLE) ×1 IMPLANT
IV NS 500ML BAXH (IV SOLUTION) ×1 IMPLANT
KIT DIALYSIS CATH TRI 30X13 (CATHETERS) IMPLANT
KIT TURNOVER KIT A (KITS) ×1 IMPLANT
LABEL OR SOLS (LABEL) ×1 IMPLANT
LOOP VESSEL MAXI 1X406 RED (MISCELLANEOUS) ×1 IMPLANT
LOOP VESSEL MINI 0.8X406 BLUE (MISCELLANEOUS) ×1 IMPLANT
MANIFOLD NEPTUNE II (INSTRUMENTS) ×1 IMPLANT
NDL FILTER BLUNT 18X1 1/2 (NEEDLE) ×1 IMPLANT
NEEDLE FILTER BLUNT 18X1 1/2 (NEEDLE) ×1 IMPLANT
NS IRRIG 500ML POUR BTL (IV SOLUTION) ×1 IMPLANT
PACK EXTREMITY ARMC (MISCELLANEOUS) ×1 IMPLANT
PAD PREP OB/GYN DISP 24X41 (PERSONAL CARE ITEMS) ×1 IMPLANT
PENCIL SMOKE EVACUATOR (MISCELLANEOUS) ×1 IMPLANT
SOLUTION CELL SAVER (CLIP) ×1 IMPLANT
SPIKE FLUID TRANSFER (MISCELLANEOUS) ×1 IMPLANT
STOCKINETTE 48X4 2 PLY STRL (GAUZE/BANDAGES/DRESSINGS) ×1 IMPLANT
STOCKINETTE STRL 4IN 9604848 (GAUZE/BANDAGES/DRESSINGS) ×1 IMPLANT
SUT GTX CV-6 36 TTC-13 3/8CIR (SUTURE) IMPLANT
SUT MNCRL AB 4-0 PS2 18 (SUTURE) ×1 IMPLANT
SUT PROLENE 5 0 RB 1 DA (SUTURE) IMPLANT
SUT PROLENE 6 0 BV (SUTURE) ×4 IMPLANT
SUT SILK 2 0 SH (SUTURE) ×1 IMPLANT
SUT SILK 2-0 18XBRD TIE 12 (SUTURE) ×1 IMPLANT
SUT SILK 3-0 18XBRD TIE 12 (SUTURE) ×1 IMPLANT
SUT VIC AB 2-0 SH 27XBRD (SUTURE) IMPLANT
SUT VIC AB 3-0 SH 27X BRD (SUTURE) ×1 IMPLANT
SUTURE EHLN 3-0 FS-10 30 BLK (SUTURE) IMPLANT
SYR 20ML LL LF (SYRINGE) ×1 IMPLANT
SYR 3ML LL SCALE MARK (SYRINGE) ×1 IMPLANT
SYRINGE TOOMEY IRRIG 70ML (MISCELLANEOUS) IMPLANT
TRAP FLUID SMOKE EVACUATOR (MISCELLANEOUS) ×1 IMPLANT
WATER STERILE IRR 500ML POUR (IV SOLUTION) ×1 IMPLANT

## 2023-09-01 NOTE — Interval H&P Note (Signed)
 History and Physical Interval Note:  09/01/2023 10:50 AM  Adam Howell  has presented today for surgery, with the diagnosis of infected AV graft Left.  The various methods of treatment have been discussed with the patient and family. After consideration of risks, benefits and other options for treatment, the patient has consented to  Procedure(s) with comments: REVISON OF ARTERIOVENOUS FISTULA (Left) - excision of AV fistula graft as a surgical intervention.  The patient's history has been reviewed, patient examined, no change in status, stable for surgery.  I have reviewed the patient's chart and labs.  Questions were answered to the patient's satisfaction.     Shakeeta Godette

## 2023-09-01 NOTE — Anesthesia Procedure Notes (Signed)
 Procedure Name: LMA Insertion Date/Time: 09/01/2023 11:46 AM  Performed by: Angelia Kelp, CRNAPre-anesthesia Checklist: Patient identified, Emergency Drugs available, Suction available, Patient being monitored and Timeout performed Patient Re-evaluated:Patient Re-evaluated prior to induction Oxygen Delivery Method: Circle system utilized Preoxygenation: Pre-oxygenation with 100% oxygen Induction Type: IV induction LMA: LMA inserted LMA Size: 5.0 Tube type: Oral Number of attempts: 1 Placement Confirmation: positive ETCO2 and breath sounds checked- equal and bilateral Tube secured with: Tape Dental Injury: Teeth and Oropharynx as per pre-operative assessment

## 2023-09-01 NOTE — Progress Notes (Signed)
 ID Pharmacy Note   Discussed patient with Dr. Levern Reader- Gisele Lamas to D/C zosyn  and leave on MRSA coverage alone.   Denson Flake, PharmD, BCPS, BCIDP Infectious Diseases Clinical Pharmacist Phone: [phone] 09/01/2023 1:22 PM

## 2023-09-01 NOTE — Progress Notes (Signed)
 Dressing still dry, clean, intact. About to take patient to room, and BP dropped to 87/46 (MAP 59) patient is asymptomatic, notified Dr. Girard Lam. Per MD. Give 200 ml of IV fluid.

## 2023-09-01 NOTE — TOC Initial Note (Signed)
 Transition of Care [facility]) - Initial/Assessment Note    Patient Details  Name: Adam Howell MRN: [id number] Date of Birth: 06-27-1960  Transition of Care [facility]) CM/SW Contact:    Odilia Bennett, LCSW Phone Number: 09/01/2023, 11:21 AM  Clinical Narrative:   CSW met with patient. No family at bedside. CSW introduced role and explained that therapy recommendations would be discussed. Patient is lethargic but able to answer questions. He is agreeable to SNF if insurance will approve it. No facility preference. Will start search today. Patient was previously under LTC at [facility]. He had been there 12/19-5/23 (155 days). Patient and family had asked for a different facility last admission and [facility] accepted him. Insurance authorization was denied so he went in under LTC. Per admissions coordinator, patient had declined to provide payment for LTC so he went home (6/2-6/6, 4 days). Per TOC handoff, he is active with [facility] for PT and RN. No further concerns. CSW will continue to follow patient for support and facilitate discharge once medically stable.               Expected Discharge Plan: Skilled Nursing Facility Barriers to Discharge: Continued Medical Work up   Patient Goals and CMS Choice            Expected Discharge Plan and Services     Post Acute Care Choice: Skilled Nursing Facility Living arrangements for the past 2 months: Single Family Home                                      Prior Living Arrangements/Services Living arrangements for the past 2 months: Single Family Home Lives with:: Adult Children Patient language and need for interpreter reviewed:: Yes Do you feel safe going back to the place where you live?: Yes      Need for Family Participation in Patient Care: Yes (Comment) Care giver support system in place?: Yes (comment) Current home services: Home PT, Home RN Criminal Activity/Legal Involvement Pertinent  to Current Situation/Hospitalization: No - Comment as needed  Activities of Daily Living      Permission Sought/Granted Permission sought to share information with : Facility Industrial/product designer granted to share information with : Yes, Verbal Permission Granted     Permission granted to share info w AGENCY: SNF's        Emotional Assessment Appearance:: Appears stated age Attitude/Demeanor/Rapport: Lethargic, Engaged Affect (typically observed): Accepting Orientation: : Oriented to Self, Oriented to Place, Oriented to  Time, Oriented to Situation Alcohol / Substance Use: Not Applicable Psych Involvement: No (comment)  Admission diagnosis:  Aspiration pneumonia (HCC) [J69.0] HCAP (healthcare-associated pneumonia) [J18.9] Pneumonia of both lungs due to infectious organism, unspecified part of lung [J18.9] Acute hypoxic respiratory failure (HCC) [J96.01] Patient Active Problem List   Diagnosis Date Noted   Encounter for dialysis catheter care (HCC) 08/31/2023   Aspiration pneumonia (HCC) 08/29/2023   HCAP (healthcare-associated pneumonia) 08/29/2023   Chronic diastolic CHF (congestive heart failure) (HCC) 08/29/2023   Pancytopenia (HCC) 08/29/2023   Anemia in ESRD (end-stage renal disease) (HCC) 08/29/2023   Overweight (BMI 25.0-29.9) 08/29/2023   CAD (coronary artery disease) 08/29/2023   Myocardial injury 08/29/2023   Ulcer of left lower extremity, limited to breakdown of skin (HCC) 12/10/2022   S/P AKA (above knee amputation), right (HCC) 03/15/2022   Controlled substance agreement signed 04/10/2021   Other thrombophilia (  HCC) 01/19/2020   Atrial fibrillation, chronic (HCC) 11/11/2019   Hyperlipidemia associated with type 2 diabetes mellitus (HCC) 11/11/2019   Erectile dysfunction 11/11/2019   Heart failure with reduced ejection fraction (HCC) 10/26/2019   Type 2 diabetes mellitus with ESRD (end-stage renal disease) (HCC) 10/04/2019   Chronic pain syndrome  10/04/2019   Insomnia 10/04/2019   Atherosclerotic heart disease of native coronary artery without angina pectoris 09/01/2019   Hypertensive heart and kidney disease with heart failure and chronic kidney disease stage V (HCC) 09/01/2019   Obesity (BMI 30-39.9) 09/01/2019   Anemia in chronic kidney disease 07/25/2019   ESRD on dialysis (HCC) 07/25/2019   Peripheral vascular disease (HCC) 07/25/2019   Secondary hyperparathyroidism of renal origin (HCC) 07/25/2019   PCP:  Lemar Pyles, NP Pharmacy:   [facility] [address] [place] - [address] [place] [address] Phone: [phone] Fax: [phone]  [facility] [address]), [place] - [address] [place] ([address]) [place] [address] Phone: [phone] Fax: [phone]  [facility] [place], [place] - [address] [place] [address] Phone: [phone] Fax: [phone]     Social Drivers of Health (SDOH) Social History: SDOH Screenings   Food Insecurity: No Food Insecurity (08/31/2023)  Housing: Low Risk  (08/31/2023)  Transportation Needs: No Transportation Needs (08/31/2023)  Utilities: Not At Risk (08/31/2023)  Alcohol Screen: Low Risk  (11/25/2021)  Depression (PHQ2-9): Low Risk  (04/14/2023)  Financial Resource Strain: Low Risk  (11/25/2021)  Physical Activity: Insufficiently Active (11/25/2021)  Social Connections: Moderately Isolated (12/29/2022)  Stress: No Stress Concern Present (11/25/2021)  Tobacco Use: Low Risk  (08/29/2023)   SDOH Interventions:     Readmission Risk Interventions    09/01/2023   11:18 AM 02/16/2023    6:21 PM  Readmission Risk Prevention Plan  Transportation Screening  Complete  PCP or Specialist Appt within 3-5 Days Complete   HRI or Home Care Consult Complete   Social Work Consult for Recovery Care Planning/Counseling Complete   Palliative Care Screening Not Applicable  Not Applicable  Medication Review [employer])  Complete

## 2023-09-01 NOTE — Care Management Important Message (Signed)
 Important Message  Patient Details  Name: Adam Howell MRN: [id number] Date of Birth: Feb 14, 1961   Important Message Given:  Yes - Medicare IM     Anise Kerns 09/01/2023, 12:49 PM

## 2023-09-01 NOTE — Progress Notes (Signed)
 Notified Dr. Vonna Guardian that patient dressing is oozing, it was oozing upon arrival to PACU from OR, reinforced dressing with ABD and paper tape, no more drainage noted at this time.

## 2023-09-01 NOTE — Op Note (Signed)
  OPERATIVE NOTE   PROCEDURE: Ultrasound guidance for vascular access left femoral vein Placement of a 30 cm triple lumen dialysis catheter left femoral vein  PRE-OPERATIVE DIAGNOSIS: 1. ESRD, bacteremia  POST-OPERATIVE DIAGNOSIS: Same  SURGEON: Mikki Alexander, MD  ASSISTANT(S): None  ANESTHESIA: general  ESTIMATED BLOOD LOSS: Minimal   FINDING(S): 1.  None  SPECIMEN(S):  None  INDICATIONS:    Patient is a 63 y.o.male who presents with none.  Risks and benefits were discussed, and informed consent was obtained..  DESCRIPTION: After obtaining full informed written consent, the patient was laid flat in the bed.  The left groin was sterilely prepped and draped in a sterile surgical field was created. The left femoral vein was visualized with ultrasound and found to be widely patent. It was then accessed under direct guidance without difficulty with a Seldinger needle and a permanent image was recorded. A J-wire was then placed. After skin nick and dilatation, a 30 cm triple lumen dialysis catheter was placed over the wire and the wire was removed. The lumens withdrew dark red nonpulsatile blood and flushed easily with sterile saline. The catheter was secured to the skin with 3 nylon sutures. Sterile dressing was placed.  COMPLICATIONS: None  CONDITION: Stable  Mikki Alexander 09/01/2023 1:09 PM  This note was created with Dragon Medical transcription system. Any errors in dictation are purely unintentional.

## 2023-09-01 NOTE — Telephone Encounter (Signed)
 Copied from CRM [id number]. Topic: Clinical - Home Health Verbal Orders >> Aug 31, 2023 12:35 PM Opal Bill wrote: Caller/Agency: [facility] Callback Number: [phone] opt 2 Service Requested: Skilled Nursing Frequency: 1w1 2w8 Any new concerns about the patient? No

## 2023-09-01 NOTE — Progress Notes (Signed)
  Echocardiogram 2D Echocardiogram has been performed.  Adam Howell 09/01/2023, 10:06 AM

## 2023-09-01 NOTE — Telephone Encounter (Signed)
 Called and gave verbal orders per Jolene.

## 2023-09-01 NOTE — Progress Notes (Signed)
 PROGRESS NOTE  Adam Howell  HQI:[id number] DOB: 06/09/1960 DOA: 08/29/2023 PCP: Lemar Pyles, NP   Adam Howell is a 63 y.o. male with medical history significant of ESRD-HD (MWF), HTN, HLD, DM, CAD, dCHF, A fib on Eliquis , amenia, chronic pain, s/p of right AKA, who presents with SOB.   Patient recently had complicated admission from 5/22 - 5/29.  At that time he presented for an elective AV fistula revision.  During admission he had hypotension with PermCath placement which was concerning for bleeding and possible IV contrast reaction, required intubation and vasopressor support, extubated on 5/26.  Pt was discharged with plans for dialysis using right groin permacatheter.     ED Course: WBC 3.8, hemoglobin 7.4 and platelet 95 (patient had WBC 10.0, hemoglobin 8.0 and platelet 215 on 08/16/2023), negative PCR for COVID, flu and RSV, troponin 107, potassium 4.4, bicarbonate 25, creatinine 9.88, BUN 41.  Temperature normal, blood pressure 98/61, heart rate 59, RR 24, oxygen saturation 95% on room air, which improved to 100% on 2 L oxygen.   CT of chest: 1. Scattered bilateral nodular ground-glass airspace disease, greatest in the lower lobes, favor bilateral pneumonia given clinical presentation. 2. Cardiomegaly. 3. Borderline mediastinal adenopathy, likely reactive. 4. Aortic Atherosclerosis (ICD10-I70.0). Coronary artery atherosclerosis.   Assessment & Plan:   Principal Problem:   HCAP (healthcare-associated pneumonia) Active Problems:   Aspiration pneumonia (HCC)   CAD (coronary artery disease)   Myocardial injury   ESRD on dialysis [facility])   Atrial fibrillation, chronic (HCC)   Type 2 diabetes mellitus with ESRD (end-stage renal disease) (HCC)   Hyperlipidemia associated with type 2 diabetes mellitus (HCC)   Chronic diastolic CHF (congestive heart failure) (HCC)   Chronic pain syndrome   Pancytopenia (HCC)   Anemia in ESRD (end-stage renal disease) (HCC)    Overweight (BMI 25.0-29.9)   Encounter for dialysis catheter care [facility])  Assessment and Plan:  HAP  vs. aspiration pneumonia: Does not meet criteria for sepsis. - MRSA swab positive  MRSA bacteremia:possible HD catheter in R groin vs previous AVF site. S/p HD catheter removed by vasc surg on 08/31/23.  - continue vanco as per ID. Echo ordered. ID following and recs apprec  - will need repeat cultures cleared the infection and evaluate echo and catheter tip culture following.  ESRD: on HD MWF.  - Nephro following and recs  - vascular consulted. Temp cath removed 6/17 from R groin. Graft excision today. Placed temp cath on L groin.  Hx of CAD & myocardial injury: trop  107, no chest pain.  Likely due to demand ischemia versus decreased clearance of troponin in the setting of ESRD. Continue on aspirin , statin   Left shin wound: present on admission. Wound care consulted   Likely PVD: w/ hx of right AKA. Uses a wheelchair. Continue w/ supportive care    Chronic a. fib: continue on amio, coreg , hold for MAP and/or HR <65. Continue on eliquis     DM2: Recent A1c 6.0, well-controlled. No need for insulin  currently    HLD: continue on statin   Chronic diastolic CHF: volume/fluid management w/ HD    Chronic pain syndrome: continue on home dose of percocet    Bicytopenia: w/ likely ACD secondary to ESRD. S/p 1 unit of pRBCs transfuse so far. Repeat H&H are trending up. Platelets are labile.   DVT prophylaxis: eliquis  Code Status: full  Family Communication: none at bedside Disposition Plan: depends on PT/OT recs.  Level of care: Telemetry  Medical  Status is: Inpatient Remains inpatient appropriate because: severity of illness, requiring IV abxs  Consultants:  ID Nephro   Procedures:   Antimicrobials: vanco   Subjective: Pt reports feeling fine today. Currently getting echo. HD tomorrow.   Objective: Vitals:   08/31/23 1047 08/31/23 1456 08/31/23 2105 09/01/23 0502  BP:  (!) 126/45 (!) 110/55 (!) 114/50 (!) 112/59  Pulse: 61 60  64  Resp:  16    Temp:  98.7 F (37.1 C) 99 F (37.2 C) 98.8 F (37.1 C)  TempSrc:  Oral    SpO2: 98% 96% 99% 98%  Weight:      Height:        Intake/Output Summary (Last 24 hours) at 09/01/2023 0734 Last data filed at 09/01/2023 0622 Gross per 24 hour  Intake 254.82 ml  Output --  Net 254.82 ml   Filed Weights   08/29/23 1958 08/31/23 0235  Weight: 106.6 kg 97.7 kg    Examination: General exam: appears lethargic  Respiratory system: diminished breath sounds b/l   Cardiovascular system: S1 & S2+. No rubs, gallops or clicks.  Gastrointestinal system: Abdomen is nondistended, soft and nontender. Normal bowel sounds heard. Central nervous system: lethargic  Psychiatry: Judgement and insight appears at baseline. Flat mood and affect  Data Reviewed: I have personally reviewed following labs and imaging studies  CBC: Recent Labs  Lab 08/29/23 1958 08/30/23 0405 08/30/23 1743 08/31/23 0347 09/01/23 0416  WBC 3.8* 4.3  --  5.2 4.5  NEUTROABS 2.4  --   --   --   --   HGB 7.4* 7.0* 8.8* 8.7* 8.0*  HCT 25.1* 23.7* 28.9* 28.5* 26.4*  MCV 94.7 94.4  --  91.9 90.7  PLT 95* 99*  --  88* 91*   Basic Metabolic Panel: Recent Labs  Lab 08/29/23 1958 08/30/23 0405 08/31/23 0347 09/01/23 0416  NA 138 138 133* 133*  K 4.4 4.7 4.3 4.3  CL 98 94* 95* 94*  CO2 25 31 25 27   GLUCOSE 97 94 87 100*  BUN 41* 47* 32* 41*  CREATININE 9.88* 10.35* 7.18* 9.14*  CALCIUM  8.3* 8.1* 8.0* 8.2*   GFR: Estimated Creatinine Clearance: 10 mL/min (A) (by C-G formula based on SCr of 9.14 mg/dL (H)). Liver Function Tests: Recent Labs  Lab 08/29/23 1958  AST 22  ALT 13  ALKPHOS 69  BILITOT 0.9  PROT 6.7  ALBUMIN  2.7*    Radiology Studies: PERIPHERAL VASCULAR CATHETERIZATION Result Date: 08/31/2023 See surgical note for result.   Scheduled Meds:  sodium chloride    Intravenous Once   amiodarone   200 mg Oral BID   apixaban    2.5 mg Oral BID   aspirin  EC  81 mg Oral Daily   carvedilol   6.25 mg Oral Daily   Chlorhexidine  Gluconate Cloth  6 each Topical Q0600   epoetin  alfa-epbx (RETACRIT ) injection  10,000 Units Intravenous Q M,W,F-1800   iron  polysaccharides  150 mg Oral Daily   leptospermum manuka honey  1 Application Topical Daily   rosuvastatin   40 mg Oral Daily   sevelamer  carbonate  2,400 mg Oral TID WC   traZODone   100 mg Oral QHS   Continuous Infusions:  sodium chloride      sodium chloride  Stopped (09/01/23 0257)   anticoagulant sodium citrate      anticoagulant sodium citrate      piperacillin -tazobactam (ZOSYN )  IV Stopped (09/01/23 0555)   vancomycin  Stopped (08/30/23 1413)     LOS: 3 days     Mackenzye Mackel  Bennetta Braun, MD [facility]  If 7PM-7AM, please contact night-coverage [email or web address] 09/01/2023, 7:34 AM

## 2023-09-01 NOTE — Op Note (Signed)
 [facility]   OPERATIVE NOTE  DATE: 08/29/2023 - 09/01/2023  PRE-OPERATIVE DIAGNOSIS: Bacteremia, concern for infection of his FISTULA or his Artegraft jump graft  POST-OPERATIVE DIAGNOSIS: same as above  PROCEDURE: 1.   Excision of Artegraft jump graft and portions of his old thrombosed AV fistula as well as stents.  SURGEON: Mikki Alexander  ASSISTANT(S): None  ANESTHESIA: general  ESTIMATED BLOOD LOSS: 50 cc  FINDING(S): 1.  none   INDICATIONS:   Adam Howell is a 63 y.o. male who presents with bacteremia and concern for infection of his old thrombosed AV fistula or his new Artegraft jump graft to the AV fistula.  Infectious disease has seen him and recommended these be removed.  He will also need a temporary dialysis catheter which will be dictated separately.  His PermCath has already been removed..  Risks and benefits were discussed and informed consent was obtained.  DESCRIPTION: After obtaining full informed written consent, the patient was brought back to the operating room and placed supine upon the operating table.  The patient was prepped and draped in the standard fashion.  Incisions were made just proximal and distal to the previous incisions for the Artegraft jump graft.  The cephalic vein just beyond the radiocephalic anastomosis was then controlled and prepared for ligation and excision at this site.  The anastomosis of the new jump graft near the radiocephalic anastomosis was identified.  This was controlled proximally and distally and then divided to remove the entirety of the Artegraft jump graft as well as the stent in this location.  The cephalic vein that was arterialized was oversewn with 5-0 Prolene suture.  A silk suture ligature was used on the distal aspect which was on the Artegraft would be excised.  I then dissected out the anastomosis near the antecubital fossa.  Again, the jump graft to cephalic vein anastomosis was identified.  It  was controlled proximally and distally and then divided so that the entirety of the Artegraft would be removed.  The stent that went into the brachial vein was then gently removed with traction.  It was oversewed with 5-0 Prolene proximally and a silk suture ligature distally.  The Artegraft was then removed in its entirety without difficulty through these incisions.  This was sent for culture.  The scabbed over old fistula was then excised elliptically.  There was old thrombus in this area and this was also sent off.  This was then oversewn to ensure there was no backbleeding from the cephalic vein with a silk suture ligature.  The wound was then closed with 3-0 Vicryl suture and loosely closed with 3-0 nylon suture.  The incisions for the removal of the Artegraft jump graft were copiously irrigated with sterile saline.  I then used 2-0 and 3-0 Vicryl to close the tissue over the cephalic vein.  There was no purulence identified.  3-0 nylon sutures used to close the skin loosely and Xeroform and a sterile dressing were placed. At this point, we elected to complete the procedure.  The patient was taken to the recovery room in stable condition.   COMPLICATIONS: None  CONDITION: Stable  Mikki Alexander  09/01/2023, 1:10 PM    This note was created with Dragon Medical transcription system. Any errors in dictation are purely unintentional.

## 2023-09-01 NOTE — [place] FL2 (Signed)
 [place]  MEDICAID [id number] LEVEL OF CARE FORM     IDENTIFICATION  Patient Name: Booker Bhatnagar Birthdate: June 01, 1960 Sex: male Admission Date (Current Location): 08/29/2023  [place] Number:  Chiropodist and Address:  [facility], [address], [place], [place] [address]      Provider Number: [id number]  Attending Physician Name and Address:  Ree Candy, MD  Relative Name and Phone Number:       Current Level of Care: Hospital Recommended Level of Care: Skilled Nursing Facility Prior Approval Number:    Date Approved/Denied:   PASRR Number: [id number]  Discharge Plan: SNF    Current Diagnoses: Patient Active Problem List   Diagnosis Date Noted   Encounter for dialysis catheter care (HCC) 08/31/2023   Aspiration pneumonia (HCC) 08/29/2023   HCAP (healthcare-associated pneumonia) 08/29/2023   Chronic diastolic CHF (congestive heart failure) (HCC) 08/29/2023   Pancytopenia (HCC) 08/29/2023   Anemia in ESRD (end-stage renal disease) (HCC) 08/29/2023   Overweight (BMI 25.0-29.9) 08/29/2023   CAD (coronary artery disease) 08/29/2023   Myocardial injury 08/29/2023   Ulcer of left lower extremity, limited to breakdown of skin (HCC) 12/10/2022   S/P AKA (above knee amputation), right (HCC) 03/15/2022   Controlled substance agreement signed 04/10/2021   Other thrombophilia (HCC) 01/19/2020   Atrial fibrillation, chronic (HCC) 11/11/2019   Hyperlipidemia associated with type 2 diabetes mellitus (HCC) 11/11/2019   Erectile dysfunction 11/11/2019   Heart failure with reduced ejection fraction (HCC) 10/26/2019   Type 2 diabetes mellitus with ESRD (end-stage renal disease) (HCC) 10/04/2019   Chronic pain syndrome 10/04/2019   Insomnia 10/04/2019   Atherosclerotic heart disease of native coronary artery without angina pectoris 09/01/2019   Hypertensive heart and kidney disease with heart failure and chronic kidney disease  stage V (HCC) 09/01/2019   Obesity (BMI 30-39.9) 09/01/2019   Anemia in chronic kidney disease 07/25/2019   ESRD on dialysis (HCC) 07/25/2019   Peripheral vascular disease (HCC) 07/25/2019   Secondary hyperparathyroidism of renal origin (HCC) 07/25/2019    Orientation RESPIRATION BLADDER Height & Weight     Self, Time, Situation, Place  O2 (Nasal Cannula 2 L) Continent Weight: 215 lb 6.2 oz (97.7 kg) Height:  6' 3 (190.5 cm)  BEHAVIORAL SYMPTOMS/MOOD NEUROLOGICAL BOWEL NUTRITION STATUS   (None)  (None) Incontinent Diet (See discharge recommendations once available. Currently NPO for procedure.)  AMBULATORY STATUS COMMUNICATION OF NEEDS Skin   Extensive Assist Verbally Skin abrasions, PU Stage and Appropriate Care, Other (Comment) (Wound on left arm: Gauze, ABD, xeroform.)   PU Stage 2 Dressing: Daily (Left ankle and left foot: Foam.)                   Personal Care Assistance Level of Assistance  Bathing, Feeding, Dressing Bathing Assistance: Maximum assistance Feeding assistance: Limited assistance Dressing Assistance: Maximum assistance     Functional Limitations Info  Hearing, Sight, Speech Sight Info: Adequate Hearing Info: Adequate Speech Info: Adequate    SPECIAL CARE FACTORS FREQUENCY  PT (By licensed PT), OT (By licensed OT)     PT Frequency: 5 x week OT Frequency: 5 x week            Contractures Contractures Info: Not present    Additional Factors Info  Code Status, Allergies, Isolation Precautions Code Status Info: Full code Allergies Info: Ivp Dye (Iodinated Contrast Media), Tramadol      Isolation Precautions Info: Contact precautions: MRSA     Current Medications (  09/01/2023):  This is the current hospital active medication list Current Facility-Administered Medications  Medication Dose Route Frequency Provider Last Rate Last Admin   [MAR Hold] 0.9 %  sodium chloride  infusion (Manually program via Guardrails IV Fluids)   Intravenous Once  Rica Chalet, MD       [facility] Hold] 0.9 %  sodium chloride  infusion   Intravenous Once Lanetta Pion, MD       0.9 %  sodium chloride  infusion   Intravenous Continuous Pace, Brien R, NP   Stopped at 09/01/23 0257   [MAR Hold] acetaminophen  (TYLENOL ) tablet 650 mg  650 mg Oral Q6H PRN Niu, Xilin, MD   650 mg at 08/30/23 2154   [facility] Hold] albuterol  (PROVENTIL ) (2.5 MG/3ML) 0.083% nebulizer solution 2.5 mg  2.5 mg Nebulization Q4H PRN Belue, Nathan S, RPH       [MAR Hold] alteplase  (CATHFLO ACTIVASE ) injection 2 mg  2 mg Intracatheter Once PRN Kolluru, Starleen Eastern, MD       [MAR Hold] alteplase  (CATHFLO ACTIVASE ) injection 2 mg  2 mg Intracatheter Once PRN Anola King, NP       [MAR Hold] alteplase  (CATHFLO ACTIVASE ) injection 2 mg  2 mg Intracatheter Once PRN Anola King, NP       [MAR Hold] amiodarone  (PACERONE ) tablet 200 mg  200 mg Oral BID Niu, Xilin, MD   200 mg at 09/01/23 0832   [MAR Hold] anticoagulant sodium citrate  solution 5 mL  5 mL Intracatheter PRN Kolluru, Sarath, MD       [MAR Hold] anticoagulant sodium citrate  solution 5 mL  5 mL Intracatheter PRN Addison Ade, NP       [MAR Hold] apixaban  (ELIQUIS ) tablet 2.5 mg  2.5 mg Oral BID Niu, Xilin, MD   2.5 mg at 09/01/23 0833   [MAR Hold] aspirin  EC tablet 81 mg  81 mg Oral Daily Niu, Xilin, MD   81 mg at 09/01/23 0832   [MAR Hold] carvedilol  (COREG ) tablet 6.25 mg  6.25 mg Oral Daily Niu, Xilin, MD   6.25 mg at 09/01/23 0832   [MAR Hold] Chlorhexidine  Gluconate Cloth 2 % PADS 6 each  6 each Topical Q0600 Kolluru, Starleen Eastern, MD   6 each at 09/01/23 0526   [MAR Hold] dextromethorphan-guaiFENesin  (MUCINEX  DM) 30-600 MG per 12 hr tablet 1 tablet  1 tablet Oral BID PRN Niu, Xilin, MD   1 tablet at 08/31/23 2124   [facility] Hold] diphenhydrAMINE  (BENADRYL ) injection 12.5 mg  12.5 mg Intravenous Q8H PRN Niu, Xilin, MD       [MAR Hold] epoetin  alfa-epbx (RETACRIT ) injection 10,000 Units  10,000 Units Intravenous Q M,W,F-1800 Rica Chalet,  MD       [MAR Hold] feeding supplement (NEPRO CARB STEADY) liquid 237 mL  237 mL Oral PRN Kolluru, Sarath, MD       [MAR Hold] heparin  injection 1,000 Units  1,000 Units Intracatheter PRN Kolluru, Sarath, MD       [MAR Hold] heparin  injection 1,000 Units  1,000 Units Intracatheter PRN Anola King, NP       [MAR Hold] heparin  injection 1,000 Units  1,000 Units Intracatheter PRN Anola King, NP       [MAR Hold] heparin  injection 1,000 Units  1,000 Units Intracatheter PRN Anola King, NP       [MAR Hold] iron  polysaccharides (NIFEREX) capsule 150 mg  150 mg Oral Daily Niu, Xilin, MD   150 mg at 09/01/23 0833   [MAR Hold] leptospermum manuka honey (MEDIHONEY) paste 1  Application  1 Application Topical Daily Alphonsus Jeans, MD   1 Application at 08/31/23 0942   [MAR Hold] lidocaine  (PF) (XYLOCAINE ) 1 % injection 5 mL  5 mL Intradermal PRN Kolluru, Sarath, MD       [MAR Hold] lidocaine -prilocaine  (EMLA ) cream 1 Application  1 Application Topical PRN Kolluru, Sarath, MD       [facility] Hold] melatonin tablet 2.5 mg  2.5 mg Oral QHS PRN Niu, Xilin, MD   2.5 mg at 08/30/23 2154   [facility] Hold] midodrine  (PROAMATINE ) tablet 5 mg  5 mg Oral PRN Niu, Xilin, MD   5 mg at 08/30/23 1444   [MAR Hold] oxyCODONE -acetaminophen  (PERCOCET/ROXICET) 5-325 MG per tablet 1-2 tablet  1-2 tablet Oral Q4H PRN Niu, Xilin, MD   2 tablet at 08/31/23 2121   [MAR Hold] pentafluoroprop-tetrafluoroeth (GEBAUERS) aerosol 1 Application  1 Application Topical PRN Kolluru, Starleen Eastern, MD       [MAR Hold] piperacillin -tazobactam (ZOSYN ) IVPB 2.25 g  2.25 g Intravenous Q8H Coretta Dexter, RPH   Stopped at 09/01/23 0555   [MAR Hold] rosuvastatin  (CRESTOR ) tablet 40 mg  40 mg Oral Daily Niu, Xilin, MD   40 mg at 09/01/23 0832   [facility] Hold] sevelamer  carbonate (RENVELA ) tablet 2,400 mg  2,400 mg Oral TID WC Niu, Xilin, MD   2,400 mg at 08/31/23 1748   [MAR Hold] traZODone  (DESYREL ) tablet 100 mg  100 mg Oral QHS Niu, Xilin, MD    100 mg at 08/31/23 2121   [facility] Hold] vancomycin  (VANCOCIN ) IVPB 1000 mg/200 mL premix  1,000 mg Intravenous Q dialysis Adalberto Acton, [facility]         Discharge Medications: Please see discharge summary for a list of discharge medications.  Relevant Imaging Results:  Relevant Lab Results:   Additional Information SS#: [id number]. HD [facility] MWF 7:00. Was previously LTC at [facility] until May. Went to [facility] under LTC for a few days but went home.  Odilia Bennett, LCSW

## 2023-09-01 NOTE — Transfer of Care (Signed)
 Immediate Anesthesia Transfer of Care Note  Patient: Adam Howell  Procedure(s) Performed: REVISON OF ARTERIOVENOUS FISTULA; INSERTION OF TEMPORARY HEMODIALYSIS CATHETER IN LEFT GROIN (Left: Arm Lower)  Patient Location: PACU  Anesthesia Type:General  Level of Consciousness: awake, alert , and oriented  Airway & Oxygen Therapy: Patient Spontanous Breathing and Patient connected to face mask oxygen  Post-op Assessment: Report given to RN and Post -op Vital signs reviewed and stable  Post vital signs: Reviewed and stable  Last Vitals:  Vitals Value Taken Time  BP 101/57 09/01/23 13:27  Temp    Pulse 62 09/01/23 13:27  Resp 25 09/01/23 13:27  SpO2 98 % 09/01/23 13:27    Last Pain:  Vitals:   09/01/23 1124  TempSrc: Temporal  PainSc:          Complications: No notable events documented.

## 2023-09-01 NOTE — Progress Notes (Signed)
 Patient received fluid amount recommended by anaesthesiologist (200 cc). Patient BP is soft. Per Dr. Girard Lam patient is okay to return to floor BP 83/53 MAP (63). Patient is alert and oriented x4.

## 2023-09-01 NOTE — Plan of Care (Signed)

## 2023-09-01 NOTE — Anesthesia Preprocedure Evaluation (Signed)
 Anesthesia Evaluation  Patient identified by MRN, date of birth, ID band Patient awake  General Assessment Comment: Difficult airway documented, but no supporting documentation could be found. Recent GETA shows Grade II view with Mcgrath 3, one intubation attempt without docuemnted issues.  Reviewed: Allergy & Precautions, NPO status , Patient's Chart, lab work & pertinent test results  History of Anesthesia Complications (+) history of anesthetic complications  Airway Mallampati: III  TM Distance: >3 FB Neck ROM: full    Dental  (+) Chipped, Poor Dentition, Missing   Pulmonary neg shortness of breath, pneumonia, unresolved Admitted a few days ago with bilateral pneumonia, being treated. On 3L/min [place] O2 on my exam, without labored breathing, 100% SpO2.   + rhonchi        Cardiovascular Exercise Tolerance: Good hypertension, + CAD, + Peripheral Vascular Disease and +CHF  + dysrhythmias Atrial Fibrillation  Rhythm:Irregular Rate:Normal     Neuro/Psych negative neurological ROS  negative psych ROS   GI/Hepatic negative GI ROS, Neg liver ROS,,,  Endo/Other  diabetes, Type 2    Renal/GU ESRF and DialysisRenal diseaseLast dialyzed two days prior. Nephrology plans to re initiate dialysis tomorrow, after placement of functional catheter.  K+ WNL today  negative genitourinary   Musculoskeletal   Abdominal   Peds  Hematology negative hematology ROS (+)   Anesthesia Other Findings Past Medical History: No date: Anemia in chronic kidney disease No date: Cellulitis of left lower extremity No date: Chronic atrial fibrillation (HCC) No date: Chronic kidney disease No date: Congestive heart failure (HCC) No date: Difficult intubation No date: End-stage renal disease on hemodialysis (HCC) No date: Hyperlipidemia No date: Hypertension No date: Type 2 diabetes mellitus with complication [facility])  Past Surgical  History: 08/27/2022: A/V FISTULAGRAM; Left     Comment:  Procedure: A/V Fistulagram;  Surgeon: Celso College, MD;               Location: [facility] INVASIVE CV LAB;  Service: Cardiovascular;              Laterality: Left; 03/13/2022: AMPUTATION; Right     Comment:  Procedure: AMPUTATION ABOVE KNEE;  Surgeon: Celso College, MD;  Location: [facility] ORS;  Service: General;                Laterality: Right; 03/07/2022: APPLICATION OF WOUND VAC; Right     Comment:  Procedure: APPLICATION OF WOUND VAC;  Surgeon: Lesta Rater, MD;  Location: [facility] ORS;  Service: Vascular;                Laterality: Right;  GAAC 21308 02/15/2023: LOWER EXTREMITY ANGIOGRAPHY; Left     Comment:  Procedure: Lower Extremity Angiography;  Surgeon: Celso College, MD;  Location: [facility] INVASIVE CV LAB;  Service:               Cardiovascular;  Laterality: Left; No date: WOUND DEBRIDEMENT; Left 01/16/2022: WOUND DEBRIDEMENT; Right     Comment:  Procedure: DEBRIDEMENT WOUND;  Surgeon: Jackquelyn Mass, MD;  Location: [facility] ORS;  Service: Vascular;                Laterality: Right;  Wound vac  placement 03/07/2022: WOUND DEBRIDEMENT; Right     Comment:  Procedure: DEBRIDEMENT WOUND RIGHT LOWER EXTREMITY;                Surgeon: Lesta Rater, MD;  Location: [facility] ORS;                Service: Vascular;  Laterality: Right;  BMI    Body Mass Index: 30.52 kg/m      Reproductive/Obstetrics negative OB ROS                             Anesthesia Physical Anesthesia Plan  ASA: 4  Anesthesia Plan: General   Post-op Pain Management: Ofirmev  IV (intra-op)*   Induction: Intravenous  PONV Risk Score and Plan: 3 and Propofol  infusion, TIVA, Midazolam , Ondansetron  and Dexamethasone   Airway Management Planned: LMA  Additional Equipment: None  Intra-op Plan:   Post-operative Plan: Extubation in OR  Informed Consent: I have reviewed the patients  History and Physical, chart, labs and discussed the procedure including the risks, benefits and alternatives for the proposed anesthesia with the patient or authorized representative who has indicated his/her understanding and acceptance.     Dental Advisory Given  Plan Discussed with: Anesthesiologist, CRNA and Surgeon  Anesthesia Plan Comments: (Discussed risks of anesthesia with patient, including PONV, sore throat, lip/dental/eye damage. Rare risks discussed as well, such as cardiorespiratory and neurological sequelae, and allergic reactions. Discussed the role of CRNA in patient's perioperative care. Patient understands.)       Anesthesia Quick Evaluation

## 2023-09-01 NOTE — Evaluation (Signed)
 Occupational Therapy Evaluation Patient Details Name: Adam Howell MRN: [id number] DOB: 1960-06-16 Today's Date: 09/01/2023   History of Present Illness   Pt is a 63 y.o. male admitted with pneumonia, MRSA + staph infection, s/p HD cath removal on 6/17, infected L forearm IV access & acute hypoxic respiratory failure. Recent complicated hospital admission from 5/22-5/29 for elective AV fistula revision, possible IV contrast reaction requiring intubation and vasopressor support, discharged with plans for HD using R groin permacatheter. PMH significant for ESRD-HD (MWF), HTN, HLD, DM, CAD, dCHF, A fib on Eliquis , amenia, chronic pain, s/p of right AKA.     Clinical Impressions Pt seen for OT/PT co-evaluation. Conflicting information provided regarding PLOF and home setup as chart review indicates pt from [facility] as resident, but pt says he lives with his daughter who provides assist for ADLs. Typically pt can lateral scoot/squat pivot t/f wheelchair mod independent.   Pt requires MAX A +2 for bed mobility, MAX A to don socks bed level, and MAX A to doff/don gown sitting EOB. Pt requires external support for static balance, and spontaneously returns to sidelying > supine due to fatigue/weakness. VSS on 2L. Pt would benefit from skilled OT services to address noted impairments and functional limitations (see below for any additional details) in order to maximize safety and independence while minimizing falls risk and caregiver burden. Anticipate the need for follow up OT services upon acute hospital DC.      If plan is discharge home, recommend the following:   Two people to help with walking and/or transfers;Two people to help with bathing/dressing/bathroom     Functional Status Assessment   Patient has had a recent decline in their functional status and demonstrates the ability to make significant improvements in function in a reasonable and predictable amount of time.      Equipment Recommendations   Other (comment)      Precautions/Restrictions   Precautions Precautions: Fall Recall of Precautions/Restrictions: Intact Precaution/Restrictions Comments: R AKA, recent removal of R fem cath, LUE wound  Restrictions Weight Bearing Restrictions Per Provider Order: No     Mobility Bed Mobility Overal bed mobility: Needs Assistance Bed Mobility: Supine to Sit, Sit to Supine     Supine to sit: Max assist, +2 for physical assistance Sit to supine: Max assist, +2 for physical assistance   General bed mobility comments: pt puts forth minimal effort    Transfers Overall transfer level: Needs assistance Equipment used: None               General transfer comment: unable to assess d/t pt fatigue      Balance Overall balance assessment: Needs assistance Sitting-balance support: Feet supported, Bilateral upper extremity supported Sitting balance-Leahy Scale: Poor Sitting balance - Comments: pt unable to withold sitting position for more that 4 min, needs mult tactile and verbal cues to maintain posture, mod a x2 to withold position, returns to sidelying spontaneously Postural control: Right lateral lean     Standing balance comment: NT                           ADL either performed or assessed with clinical judgement   ADL Overall ADL's : Needs assistance/impaired                 Upper Body Dressing : Maximal assistance;Sitting Upper Body Dressing Details (indicate cue type and reason): doff and don new gown sitting EOB Lower Body Dressing: Maximal  assistance;Bed level Lower Body Dressing Details (indicate cue type and reason): bed level to don socks               General ADL Comments: Transfers not assessed due to pt fatigue. Pt does not appear motivated to engage in self-care or mobility tasks, requires max encourgement and pt spontanously returning to sidelying several times while upright      Pertinent  Vitals/Pain Pain Assessment Pain Assessment: Faces Faces Pain Scale: Hurts even more Pain Location: LLE Pain Descriptors / Indicators: Discomfort, Grimacing, Guarding Pain Intervention(s): Monitored during session, Repositioned     Extremity/Trunk Assessment Upper Extremity Assessment Upper Extremity Assessment: Generalized weakness   Lower Extremity Assessment Lower Extremity Assessment: RLE deficits/detail RLE Deficits / Details: hx of R amputation, unable to fully assess d/t pt fatigue   Cervical / Trunk Assessment Cervical / Trunk Assessment: Kyphotic   Communication Communication Communication: Impaired Factors Affecting Communication: Reduced clarity of speech;Difficulty expressing self   Cognition Arousal: Alert Behavior During Therapy: Restless, Flat affect Cognition: No family/caregiver present to determine baseline, Difficult to assess Difficult to assess due to:  (Flat affect)           OT - Cognition Comments: Pt not very forthcoming with hx and PLOF, flat affect                 Following commands: Impaired Following commands impaired: Follows one step commands inconsistently     Cueing  General Comments   Cueing Techniques: Verbal cues;Tactile cues  VSS, on 2L O2, RN in room           Home Living Family/patient expects to be discharged to:: Skilled nursing facility                                 Additional Comments: Pt states he lives with daughter but per chart review states he comes from SNF, need to further clarify with daughter.      Prior Functioning/Environment Prior Level of Function : Patient poor historian/Family not available       Physical Assist : Mobility (physical);ADLs (physical) Mobility (physical): Transfers ADLs (physical): Bathing;Grooming;Dressing;IADLs Mobility Comments: Need to clarify with daughter on what he receives help with, unclear of what he needs help with, states he is able to transfer  from/to WC ind at baseline ADLs Comments: sponge bath, gets assistance from daughter with dressing, need to further clarify    OT Problem List: Decreased strength;Decreased activity tolerance;Decreased range of motion;Impaired balance (sitting and/or standing)   OT Treatment/Interventions: Self-care/ADL training;Therapeutic exercise;Therapeutic activities;DME and/or AE instruction;Patient/family education;Balance training      OT Goals(Current goals can be found in the care plan section)   Acute Rehab OT Goals OT Goal Formulation: With patient Time For Goal Achievement: 09/15/23 Potential to Achieve Goals: Good   OT Frequency:  Min 2X/week    Co-evaluation PT/OT/SLP Co-Evaluation/Treatment: Yes Reason for Co-Treatment: Necessary to address cognition/behavior during functional activity;To address functional/ADL transfers PT goals addressed during session: Mobility/safety with mobility;Balance OT goals addressed during session: ADL's and self-care      AM-PAC OT 6 Clicks Daily Activity     Outcome Measure Help from another person eating meals?: None Help from another person taking care of personal grooming?: None Help from another person toileting, which includes using toliet, bedpan, or urinal?: Total Help from another person bathing (including washing, rinsing, drying)?: Total Help from another person to put on and  taking off regular upper body clothing?: Total Help from another person to put on and taking off regular lower body clothing?: Total 6 Click Score: 12   End of Session Equipment Utilized During Treatment: Oxygen Nurse Communication: Mobility status  Activity Tolerance: Patient limited by fatigue Patient left: in bed;with call bell/phone within reach;with bed alarm set  OT Visit Diagnosis: Other abnormalities of gait and mobility (R26.89);Muscle weakness (generalized) (M62.81)                Time: 5784-6962 OT Time Calculation (min): 15 min Charges:  OT  General Charges $OT Visit: 1 Visit OT Evaluation $OT Eval Low Complexity: 1 Low Pailyn [place] L. Linsay Vogt, OTR/L  09/01/23, 10:42 AM

## 2023-09-01 NOTE — Evaluation (Signed)
 Physical Therapy Evaluation Patient Details Name: Adam Howell MRN: [id number] DOB: [birth year] Today's Date: 09/01/2023  History of Present Illness  Adam Howell is a 63 y.o. male with medical history significant of ESRD-HD (MWF), HTN, HLD, DM, CAD, dCHF, A fib on Eliquis , amenia, chronic pain, s/p of right AKA, who presents with SOB.  Clinical Impression  Co-treat with OT this date. Pt is a pleasant 63 year old male who was admitted for facility acquired PNA . Pt performs bed mobility with max a x2, requiring multiple verbal and tactile cues to initiate mobility, unable to fully participate d/t fatigue and overall weakness. Supine<>sit transfer continues to require max a x2, as pt too weak to participate. Despite encouragement from PT/OT, pt declined transfer to recliner. Pt SpO2 remained stable on 2L at 95%. Pt demonstrates deficits with balance and generalized weakness that are impacting his QoL and function. Pt would benefit from skilled PT interventions to address needs and meet therapy goals. PT to follow acutely as appropriate.          If plan is discharge home, recommend the following: Assistance with feeding;Direct supervision/assist for medications management;Assistance with cooking/housework;Direct supervision/assist for financial management;A little help with walking and/or transfers;A little help with bathing/dressing/bathroom;Assist for transportation   Can travel by private vehicle   No    Equipment Recommendations Other (comment)  Recommendations for Other Services       Functional Status Assessment Patient has had a recent decline in their functional status and demonstrates the ability to make significant improvements in function in a reasonable and predictable amount of time.     Precautions / Restrictions Precautions Precautions: Fall Recall of Precautions/Restrictions: Intact Restrictions Weight Bearing Restrictions Per Provider Order: No       Mobility  Bed Mobility Overal bed mobility: Needs Assistance Bed Mobility: Supine to Sit, Sit to Supine     Supine to sit: Max assist, +2 for physical assistance Sit to supine: Max assist, +2 for physical assistance   General bed mobility comments: pt unmotivated to perform mobility himself stating that he is not feeling good, requires multiple verbal and tactile cues to initiate any sort of movement, max a x2 for supine<>sit    Transfers                   General transfer comment: unable to assess d/t pt fatigue    Ambulation/Gait               General Gait Details: pt non-amb at baseline  Stairs            Wheelchair Mobility     Tilt Bed    Modified Rankin (Stroke Patients Only)       Balance Overall balance assessment: Needs assistance Sitting-balance support: Feet supported, Bilateral upper extremity supported Sitting balance-Leahy Scale: Poor Sitting balance - Comments: pt unable to withold sitting position for more that 4 min, needs mult tactile and verbal cues to maintain posture, mod a x2 to withold position Postural control: Right lateral lean                                   Pertinent Vitals/Pain Pain Assessment Pain Assessment: Faces Faces Pain Scale: Hurts even more    Home Living Family/patient expects to be discharged to:: Skilled nursing facility  Additional Comments: Pt states he lives with daughter but per chart review states he comes from SNF, need to further clarify with daughter    Prior Function Prior Level of Function : Needs assist       Physical Assist : Mobility (physical);ADLs (physical) Mobility (physical): Transfers ADLs (physical): Bathing;Grooming;Dressing;IADLs Mobility Comments: Need to clarify with daughter on what he receives help with, unclear of what he needs help with, states he is able to transfer from/to WC ind at baseline ADLs Comments: sponge bath, gets  assistance from daughter with dressing, need to further clarify     Extremity/Trunk Assessment   Upper Extremity Assessment Upper Extremity Assessment: Defer to OT evaluation    Lower Extremity Assessment Lower Extremity Assessment: RLE deficits/detail RLE Deficits / Details: hx of R amputation, unable to fully assess d/t pt fatigue    Cervical / Trunk Assessment Cervical / Trunk Assessment: Kyphotic  Communication   Communication Communication: No apparent difficulties    Cognition Arousal: Alert Behavior During Therapy: Restless   PT - Cognitive impairments: Safety/Judgement                       PT - Cognition Comments: Need to further asses next date Following commands: Impaired Following commands impaired: Follows one step commands inconsistently     Cueing Cueing Techniques: Verbal cues, Tactile cues     General Comments      Exercises     Assessment/Plan    PT Assessment Patient needs continued PT services  PT Problem List Decreased strength;Decreased range of motion;Decreased activity tolerance;Decreased balance;Decreased mobility;Decreased knowledge of use of DME       PT Treatment Interventions DME instruction;Modalities;Gait training;Balance training;Neuromuscular re-education;Stair training;Functional mobility training;Therapeutic activities;Therapeutic exercise;Patient/family education    PT Goals (Current goals can be found in the Care Plan section)  Acute Rehab PT Goals Patient Stated Goal: to get better PT Goal Formulation: With patient Time For Goal Achievement: 09/15/23 Potential to Achieve Goals: Fair    Frequency Min 2X/week     Co-evaluation PT/OT/SLP Co-Evaluation/Treatment: Yes Reason for Co-Treatment: Necessary to address cognition/behavior during functional activity;To address functional/ADL transfers PT goals addressed during session: Mobility/safety with mobility;Balance         AM-PAC PT 6 Clicks Mobility   Outcome Measure Help needed turning from your back to your side while in a flat bed without using bedrails?: A Lot Help needed moving from lying on your back to sitting on the side of a flat bed without using bedrails?: A Lot Help needed moving to and from a bed to a chair (including a wheelchair)?: A Lot Help needed standing up from a chair using your arms (e.g., wheelchair or bedside chair)?: Total Help needed to walk in hospital room?: Total Help needed climbing 3-5 steps with a railing? : Total 6 Click Score: 9    End of Session   Activity Tolerance: Patient limited by fatigue Patient left: in bed;with call bell/phone within reach;with bed alarm set Nurse Communication: Mobility status PT Visit Diagnosis: Muscle weakness (generalized) (M62.81);Other abnormalities of gait and mobility (R26.89)    Time: 5409-8119 PT Time Calculation (min) (ACUTE ONLY): 15 min   Charges:                  Jazzlin Clements Romero-Perozo, SPT  09/01/2023, 10:17 AM

## 2023-09-01 NOTE — Progress Notes (Signed)
Patient off the floor at this time for procedure.

## 2023-09-01 NOTE — Progress Notes (Signed)
 [facility]  ROUNDING NOTE   Subjective:   Adam Howell is a 63 y.o male with past medical history of diabetes, hypertension, CAD, Rt AKA, anemia, CHF, and ESRD on dialysis. Patient presents to vascular surgery for revision of AVF. He is being admitted for Aspiration pneumonia (HCC) [J69.0] HCAP (healthcare-associated pneumonia) [J18.9] Pneumonia of both lungs due to infectious organism, unspecified part of lung [J18.9] Acute hypoxic respiratory failure (HCC) [J96.01]  Patient presented to the hospital for shortness of breath.  He was diagnosed with pneumonia and is being admitted for further management.  Blood cultures are positive for staph species and MRSA in addition to gram-positive rods.  Femoral tunneled dialysis catheter was removed 6-17/25.  Update: Patient underwent excision of the jump graft today. Due for dialysis thereafter.   Objective:  Vital signs in last 24 hours:  Temp:  [97.4 F (36.3 C)-99 F (37.2 C)] 97.5 F (36.4 C) (06/18 1602) Pulse Rate:  [54-66] 54 (06/18 1602) Resp:  [10-25] 18 (06/18 1602) BP: (83-114)/(45-70) 92/54 (06/18 1602) SpO2:  [90 %-100 %] 100 % (06/18 1602)  Weight change:  Filed Weights   08/29/23 1958 08/31/23 0235  Weight: 106.6 kg 97.7 kg    Intake/Output: I/O last 3 completed shifts: In: 604.8 [I.V.:4.8; IV Piggyback:600] Out: -    Intake/Output this shift:  Total I/O In: 600 [I.V.:500; IV Piggyback:100] Out: 25 [Blood:25]  Physical Exam: General: No acute distress  Head: [place]/AT, hearing intact  Eyes: Anicteric  Lungs:  Bilateral coarse breath sounds  Heart: Regular rate and rhythm  Abdomen:  Soft, nontender, obese  Extremities: No peripheral edema  Right AKA  Neurologic: Awake, alert  Skin: No lesions  Access: Jump graft removed, left femoral temporary dialysis catheter placed    Basic Metabolic Panel: Recent Labs  Lab 08/29/23 1958 08/30/23 0405 08/31/23 0347 09/01/23 0416  NA 138 138  133* 133*  K 4.4 4.7 4.3 4.3  CL 98 94* 95* 94*  CO2 25 31 25 27   GLUCOSE 97 94 87 100*  BUN 41* 47* 32* 41*  CREATININE 9.88* 10.35* 7.18* 9.14*  CALCIUM  8.3* 8.1* 8.0* 8.2*    Liver Function Tests: Recent Labs  Lab 08/29/23 1958  AST 22  ALT 13  ALKPHOS 69  BILITOT 0.9  PROT 6.7  ALBUMIN  2.7*   No results for input(s): LIPASE, AMYLASE in the last 168 hours. No results for input(s): AMMONIA in the last 168 hours.  CBC: Recent Labs  Lab 08/29/23 1958 08/30/23 0405 08/30/23 1743 08/31/23 0347 09/01/23 0416  WBC 3.8* 4.3  --  5.2 4.5  NEUTROABS 2.4  --   --   --   --   HGB 7.4* 7.0* 8.8* 8.7* 8.0*  HCT 25.1* 23.7* 28.9* 28.5* 26.4*  MCV 94.7 94.4  --  91.9 90.7  PLT 95* 99*  --  88* 91*    Cardiac Enzymes: No results for input(s): CKTOTAL, CKMB, CKMBINDEX, TROPONINI in the last 168 hours.  BNP: Invalid input(s): POCBNP  CBG: Recent Labs  Lab 09/01/23 1326  GLUCAP 96     Microbiology: Results for orders placed or performed during the hospital encounter of 08/29/23  Resp panel by RT-PCR (RSV, Flu A&B, Covid) Anterior Nasal Swab     Status: None   Collection Time: 08/29/23  8:00 PM   Specimen: Anterior Nasal Swab  Result Value Ref Range Status   SARS Coronavirus 2 by RT PCR NEGATIVE NEGATIVE Final    Comment: (NOTE) SARS-CoV-2 target  nucleic acids are NOT DETECTED.  The SARS-CoV-2 RNA is generally detectable in upper respiratory specimens during the acute phase of infection. The lowest concentration of SARS-CoV-2 viral copies this assay can detect is 138 copies/mL. A negative result does not preclude SARS-Cov-2 infection and should not be used as the sole basis for treatment or other patient management decisions. A negative result may occur with  improper specimen collection/handling, submission of specimen other than nasopharyngeal swab, presence of viral mutation(s) within the areas targeted by this assay, and inadequate number of  viral copies(<138 copies/mL). A negative result must be combined with clinical observations, patient history, and epidemiological information. The expected result is Negative.  Fact Sheet for Patients:  BloggerCourse.com  Fact Sheet for Healthcare Providers:  SeriousBroker.it  This test is no t yet approved or cleared by the [place]  FDA and  has been authorized for detection and/or diagnosis of SARS-CoV-2 by FDA under an Emergency Use Authorization (EUA). This EUA will remain  in effect (meaning this test can be used) for the duration of the COVID-19 declaration under Section 564(b)(1) of the Act, 21 U.S.C.section 360bbb-3(b)(1), unless the authorization is terminated  or revoked sooner.       Influenza A by PCR NEGATIVE NEGATIVE Final   Influenza B by PCR NEGATIVE NEGATIVE Final    Comment: (NOTE) The Xpert Xpress SARS-CoV-2/FLU/RSV plus assay is intended as an aid in the diagnosis of influenza from Nasopharyngeal swab specimens and should not be used as a sole basis for treatment. Nasal washings and aspirates are unacceptable for Xpert Xpress SARS-CoV-2/FLU/RSV testing.  Fact Sheet for Patients: BloggerCourse.com  Fact Sheet for Healthcare Providers: SeriousBroker.it  This test is not yet approved or cleared by the [place]  FDA and has been authorized for detection and/or diagnosis of SARS-CoV-2 by FDA under an Emergency Use Authorization (EUA). This EUA will remain in effect (meaning this test can be used) for the duration of the COVID-19 declaration under Section 564(b)(1) of the Act, 21 U.S.C. section 360bbb-3(b)(1), unless the authorization is terminated or revoked.     Resp Syncytial Virus by PCR NEGATIVE NEGATIVE Final    Comment: (NOTE) Fact Sheet for Patients: BloggerCourse.com  Fact Sheet for Healthcare  Providers: SeriousBroker.it  This test is not yet approved or cleared by the [place]  FDA and has been authorized for detection and/or diagnosis of SARS-CoV-2 by FDA under an Emergency Use Authorization (EUA). This EUA will remain in effect (meaning this test can be used) for the duration of the COVID-19 declaration under Section 564(b)(1) of the Act, 21 U.S.C. section 360bbb-3(b)(1), unless the authorization is terminated or revoked.  Performed at [facility], [address] [place], [place] [address]   Blood Culture (routine x 2)     Status: Abnormal   Collection Time: 08/30/23 12:06 AM   Specimen: BLOOD RIGHT HAND  Result Value Ref Range Status   Specimen Description   Final    BLOOD RIGHT HAND Performed at [facility], [address] [place], [place] [address]    Special Requests   Final    BOTTLES DRAWN AEROBIC AND ANAEROBIC Blood Culture results may not be optimal due to an inadequate volume of blood received in culture bottles Performed at [facility], [address], [place], [place] [address]    Culture  Setup Time   Final    GRAM POSITIVE COCCI IN BOTH AEROBIC AND ANAEROBIC BOTTLES CRITICAL RESULT CALLED TO, READ BACK BY AND VERIFIED WITH: TREY GREENWOOD  08/30/23 1230 MW Performed at [facility], [address] [place], [place] [address]    Culture (A)  Final    STAPHYLOCOCCUS AUREUS SUSCEPTIBILITIES PERFORMED ON PREVIOUS CULTURE WITHIN THE LAST 5 DAYS. Performed at [facility], [address] [place], [place] [address]    Report Status 09/01/2023 FINAL  Final  Blood Culture (routine x 2)     Status: Abnormal   Collection Time: 08/30/23 12:06 AM   Specimen: BLOOD RIGHT ARM  Result Value Ref Range Status   Specimen Description   Final    BLOOD RIGHT ARM Performed at [facility], [address], [place], [place] [address]    Special Requests   Final    BOTTLES DRAWN  AEROBIC AND ANAEROBIC Blood Culture results may not be optimal due to an inadequate volume of blood received in culture bottles Performed at [facility], [address] [place], [place] [address]    Culture  Setup Time   Final    GRAM POSITIVE COCCI IN BOTH AEROBIC AND ANAEROBIC BOTTLES CRITICAL RESULT CALLED TO, READ BACK BY AND VERIFIED WITH: TREY GREENWOOD 08/30/23 1230 MW GRAM POSITIVE RODS ANAEROBIC BOTTLE ONLY CRITICAL RESULT CALLED TO, READ BACK BY AND VERIFIED WITH: PHARMD W. ANDERSON [id number] @1700  FH Performed at [facility], [address] [place], [place] [address]    Culture (A)  Final    METHICILLIN RESISTANT STAPHYLOCOCCUS AUREUS CLOSTRIDIUM PERFRINGENS    Report Status 09/01/2023 FINAL  Final   Organism ID, Bacteria METHICILLIN RESISTANT STAPHYLOCOCCUS AUREUS  Final      Susceptibility   Methicillin resistant staphylococcus aureus - MIC*    CIPROFLOXACIN >=8 RESISTANT Resistant     ERYTHROMYCIN >=8 RESISTANT Resistant     GENTAMICIN <=0.5 SENSITIVE Sensitive     OXACILLIN >=4 RESISTANT Resistant     TETRACYCLINE >=16 RESISTANT Resistant     VANCOMYCIN  1 SENSITIVE Sensitive     TRIMETH /SULFA  <=10 SENSITIVE Sensitive     CLINDAMYCIN >=8 RESISTANT Resistant     RIFAMPIN <=0.5 SENSITIVE Sensitive     Inducible Clindamycin NEGATIVE Sensitive     LINEZOLID  2 SENSITIVE Sensitive     * METHICILLIN RESISTANT STAPHYLOCOCCUS AUREUS  Blood Culture ID Panel (Reflexed)     Status: Abnormal   Collection Time: 08/30/23 12:06 AM  Result Value Ref Range Status   Enterococcus faecalis NOT DETECTED NOT DETECTED Final   Enterococcus Faecium NOT DETECTED NOT DETECTED Final   Listeria monocytogenes NOT DETECTED NOT DETECTED Final   Staphylococcus species DETECTED (A) NOT DETECTED Final    Comment: CRITICAL RESULT CALLED TO, READ BACK BY AND VERIFIED WITH: TREY GREENWOOD 08/30/23 1230 MW    Staphylococcus aureus (BCID) DETECTED (A) NOT DETECTED Final    Comment:  Methicillin (oxacillin)-resistant Staphylococcus aureus (MRSA). MRSA is predictably resistant to beta-lactam antibiotics (except ceftaroline). Preferred therapy is vancomycin  unless clinically contraindicated. Patient requires contact precautions if  hospitalized. CRITICAL RESULT CALLED TO, READ BACK BY AND VERIFIED WITH: TREY GREENWOOD 08/30/23 1230 MW    Staphylococcus epidermidis NOT DETECTED NOT DETECTED Final   Staphylococcus lugdunensis NOT DETECTED NOT DETECTED Final   Streptococcus species NOT DETECTED NOT DETECTED Final   Streptococcus agalactiae NOT DETECTED NOT DETECTED Final   Streptococcus pneumoniae NOT DETECTED NOT DETECTED Final   Streptococcus pyogenes NOT DETECTED NOT DETECTED Final   A.calcoaceticus-baumannii NOT DETECTED NOT DETECTED Final   Bacteroides fragilis NOT DETECTED NOT DETECTED Final   Enterobacterales NOT DETECTED NOT DETECTED Final  Enterobacter cloacae complex NOT DETECTED NOT DETECTED Final   Escherichia coli NOT DETECTED NOT DETECTED Final   Klebsiella aerogenes NOT DETECTED NOT DETECTED Final   Klebsiella oxytoca NOT DETECTED NOT DETECTED Final   Klebsiella pneumoniae NOT DETECTED NOT DETECTED Final   Proteus species NOT DETECTED NOT DETECTED Final   Salmonella species NOT DETECTED NOT DETECTED Final   Serratia marcescens NOT DETECTED NOT DETECTED Final   Haemophilus influenzae NOT DETECTED NOT DETECTED Final   Neisseria meningitidis NOT DETECTED NOT DETECTED Final   Pseudomonas aeruginosa NOT DETECTED NOT DETECTED Final   Stenotrophomonas maltophilia NOT DETECTED NOT DETECTED Final   Candida albicans NOT DETECTED NOT DETECTED Final   Candida auris NOT DETECTED NOT DETECTED Final   Candida glabrata NOT DETECTED NOT DETECTED Final   Candida krusei NOT DETECTED NOT DETECTED Final   Candida parapsilosis NOT DETECTED NOT DETECTED Final   Candida tropicalis NOT DETECTED NOT DETECTED Final   Cryptococcus neoformans/gattii NOT DETECTED NOT DETECTED Final    Meth resistant mecA/C and MREJ DETECTED (A) NOT DETECTED Final    Comment: CRITICAL RESULT CALLED TO, READ BACK BY AND VERIFIED WITH: Marguerite Shiley 08/30/23 1230 MW Performed at [facility], [address] [place], [place] [address]   MRSA Next Gen by PCR, Nasal     Status: Abnormal   Collection Time: 08/30/23  2:53 PM   Specimen: Nasal Mucosa; Nasal Swab  Result Value Ref Range Status   MRSA by PCR Next Gen DETECTED (A) NOT DETECTED Final    Comment: CRITICAL RESULT CALLED TO, READ BACK BY AND VERIFIED WITH:  Lorayne Rocks RN 08/30/2023 @1624  KKG (NOTE) The GeneXpert MRSA Assay (FDA approved for NASAL specimens only), is one component of a comprehensive MRSA colonization surveillance program. It is not intended to diagnose MRSA infection nor to guide or monitor treatment for MRSA infections. Test performance is not FDA approved in patients less than 67 years old. Performed at [facility], [address], [place], [place] [address]   Cath Tip Culture     Status: None (Preliminary result)   Collection Time: 08/31/23  8:50 AM   Specimen: Catheter Tip; Other  Result Value Ref Range Status   Specimen Description   Final    CATH TIP Performed at [facility], [address], [place], [place] [address]    Special Requests   Final    NONE Performed at [facility], [address], [place], [place] [address]    Culture   Final    NO GROWTH < 24 HOURS Performed at [facility], [address], [place], [place] [address]    Report Status PENDING  Incomplete  Culture, blood (single) w Reflex to ID Panel     Status: None (Preliminary result)   Collection Time: 08/31/23 10:12 AM   Specimen: BLOOD  Result Value Ref Range Status   Specimen Description BLOOD RIGHT FA  Final   Special Requests   Final    BOTTLES DRAWN AEROBIC AND ANAEROBIC Blood Culture adequate volume   Culture   Final    NO GROWTH < 24 HOURS Performed at [facility], [address] [place], [place] [address]    Report Status PENDING  Incomplete    Coagulation Studies: No results for input(s): LABPROT, INR in the last 72 hours.   Urinalysis: No results for input(s): COLORURINE, LABSPEC, PHURINE, GLUCOSEU, HGBUR, BILIRUBINUR, KETONESUR, PROTEINUR, UROBILINOGEN, NITRITE, LEUKOCYTESUR in the last 72 hours.  Invalid  input(s): APPERANCEUR    Imaging: ECHOCARDIOGRAM LIMITED Result Date: 09/01/2023    ECHOCARDIOGRAM LIMITED REPORT   Patient Name:   YANNIS BROCE Date of Exam: 09/01/2023 Medical Rec #:  [id number]           Height:       75.0 in Accession #:    [id number]          Weight:       215.4 lb Date of Birth:  [birth year]           BSA:          2.265 m Patient Age:    63 years            BP:           112/59 mmHg Patient Gender: M                   HR:           60 bpm. Exam Location:  [facility] Procedure: Limited Echo, Limited Color Doppler and Cardiac Doppler (Both            Spectral and Color Flow Doppler were utilized during procedure). Indications:     Bacteremia  History:         Patient has prior history of Echocardiogram examinations, most                  recent 08/09/2023. CHF, CAD, end stage renal disease,                  Arrythmias:Atrial Fibrillation; Risk Factors:Dyslipidemia,                  Hypertension and Diabetes.  Sonographer:     Dione Franks RDCS Referring Phys:  [id number] Alphonsus Jeans Diagnosing Phys: Belva Boyden MD IMPRESSIONS  1. Left ventricular ejection fraction, by estimation, is 60 to 65%. The left ventricle has normal function. The left ventricle has no regional wall motion abnormalities. There is mild left ventricular hypertrophy.  2. Right ventricular systolic function is mildly reduced. The right ventricular size is moderately enlarged. There is severely elevated pulmonary artery systolic pressure. The estimated right ventricular systolic pressure is 64.0 mmHg.  3. The  mitral valve is normal in structure. Mild mitral valve regurgitation. No evidence of mitral stenosis.  4. Tricuspid valve regurgitation is moderate.  5. The aortic valve is normal in structure. There is moderate calcification of the aortic valve. Aortic valve regurgitation is not visualized. Aortic valve sclerosis/calcification is present, without any evidence of aortic stenosis.  6. The inferior vena cava is dilated in size with <50% respiratory variability, suggesting right atrial pressure of 15 mmHg. FINDINGS  Left Ventricle: Left ventricular ejection fraction, by estimation, is 60 to 65%. The left ventricle has normal function. The left ventricle has no regional wall motion abnormalities. The left ventricular internal cavity size was normal in size. There is  mild left ventricular hypertrophy. Right Ventricle: The right ventricular size is moderately enlarged. No increase in right ventricular wall thickness. Right ventricular systolic function is mildly reduced. There is severely elevated pulmonary artery systolic pressure. The tricuspid regurgitant velocity is 3.50 m/s, and with an assumed right atrial pressure of 15 mmHg, the estimated right ventricular systolic pressure is 64.0 mmHg. Left Atrium: Left atrial size was normal in size. Right Atrium: Right atrial size was normal in size. Pericardium: There is no evidence of pericardial effusion. Mitral Valve: The mitral valve is normal in  structure. Mild mitral annular calcification. Mild mitral valve regurgitation. No evidence of mitral valve stenosis. There is no evidence of mitral valve vegetation. Tricuspid Valve: The tricuspid valve is normal in structure. Tricuspid valve regurgitation is moderate . No evidence of tricuspid stenosis. There is no evidence of tricuspid valve vegetation. Aortic Valve: The aortic valve is normal in structure. There is moderate calcification of the aortic valve. Aortic valve regurgitation is not visualized. Aortic valve  sclerosis/calcification is present, without any evidence of aortic stenosis. There is no evidence of aortic valve vegetation. Pulmonic Valve: The pulmonic valve was normal in structure. Pulmonic valve regurgitation is not visualized. No evidence of pulmonic stenosis. There is no evidence of pulmonic valve vegetation. Aorta: The aortic root is normal in size and structure. Venous: The inferior vena cava is dilated in size with less than 50% respiratory variability, suggesting right atrial pressure of 15 mmHg. IAS/Shunts: No atrial level shunt detected by color flow Doppler. Additional Comments: Spectral Doppler performed. Color Doppler performed.  LEFT VENTRICLE PLAX 2D LVIDd:         4.20 cm LVIDs:         2.70 cm LV PW:         1.50 cm LV IVS:        1.30 cm  IVC IVC diam: 2.80 cm LEFT ATRIUM         Index LA diam:    4.50 cm 1.99 cm/m  AORTIC VALVE LVOT Vmax:   123.00 cm/s LVOT Vmean:  65.700 cm/s LVOT VTI:    0.217 m  AORTA Ao Asc diam: 3.40 cm MITRAL VALVE                TRICUSPID VALVE MV Area (PHT): 4.24 cm     TR Peak grad:   49.0 mmHg MV Decel Time: 179 msec     TR Vmax:        350.00 cm/s MV E velocity: 124.00 cm/s MV A velocity: 29.60 cm/s   SHUNTS MV E/A ratio:  4.19         Systemic VTI: 0.22 m Belva Boyden MD Electronically signed by Belva Boyden MD Signature Date/Time: 09/01/2023/2:40:38 PM    Final (Updated)    PERIPHERAL VASCULAR CATHETERIZATION Result Date: 08/31/2023 See surgical note for result.     Medications:    sodium chloride      anticoagulant sodium citrate      anticoagulant sodium citrate      vancomycin        sodium chloride    Intravenous Once   amiodarone   200 mg Oral BID   apixaban   2.5 mg Oral BID   aspirin  EC  81 mg Oral Daily   carvedilol   6.25 mg Oral Daily   Chlorhexidine  Gluconate Cloth  6 each Topical Q0600   epoetin  alfa-epbx (RETACRIT ) injection  10,000 Units Intravenous Q M,W,F-1800   iron  polysaccharides  150 mg Oral Daily   leptospermum manuka  honey  1 Application Topical Daily   rosuvastatin   40 mg Oral Daily   sevelamer  carbonate  2,400 mg Oral TID WC   traZODone   100 mg Oral QHS   acetaminophen , albuterol , alteplase , alteplase , alteplase , anticoagulant sodium citrate , anticoagulant sodium citrate , dextromethorphan-guaiFENesin , diphenhydrAMINE , feeding supplement (NEPRO CARB STEADY), heparin , heparin , heparin , heparin , lidocaine  (PF), lidocaine -prilocaine , melatonin, midodrine , oxyCODONE -acetaminophen , pentafluoroprop-tetrafluoroeth, vancomycin   Assessment/ Plan:  Adam Howell is a 63 y.o.  male with end stage renal disease on hemodialysis, diabetes, hypertension, CAD, Rt AKA, anemia, CHF who was admitted to [facility]  on 08/29/2023 for revision of AVF. Patient had anaphylactic reaction and admitted to ICU.   [facility] [place]/MWF/left AVG removed  End-stage renal disease with hyperkalemia on hemodialysis.   - Temporary dialysis catheter placed today.  Jump graft removed 09/01/2023.  Due for dialysis treatment later today.  2. Anemia of chronic kidney disease Lab Results  Component Value Date   HGB 8.0 (L) 09/01/2023   Maintain the patient on Epogen  10,000 units IV with dialysis.  3. Secondary Hyperparathyroidism: with outpatient labs:     Lab Results  Component Value Date   CALCIUM  8.2 (L) 09/01/2023   CAION 1.06 (L) 08/05/2023   PHOS 2.5 08/14/2023  Monitor calcium  and phosphorus throughout hospitalization.  4. Pneumonia, sepsis and shortness of breath - Blood cultures (6/16) positive for MRSA and gram-positive rods  - iv Abx as per hospitalist team  - Remains on 2 L nasal cannula oxygen supplementation   LOS: 3 [place] Hijazi 6/18/20254:42 PM

## 2023-09-02 ENCOUNTER — Encounter (INDEPENDENT_AMBULATORY_CARE_PROVIDER_SITE_OTHER)

## 2023-09-02 ENCOUNTER — Other Ambulatory Visit: Admitting: Urology

## 2023-09-02 ENCOUNTER — Encounter: Payer: Self-pay | Admitting: Vascular Surgery

## 2023-09-02 ENCOUNTER — Ambulatory Visit (INDEPENDENT_AMBULATORY_CARE_PROVIDER_SITE_OTHER): Admitting: Nurse Practitioner

## 2023-09-02 DIAGNOSIS — Z7901 Long term (current) use of anticoagulants: Secondary | ICD-10-CM

## 2023-09-02 DIAGNOSIS — J189 Pneumonia, unspecified organism: Secondary | ICD-10-CM | POA: Diagnosis not present

## 2023-09-02 DIAGNOSIS — N186 End stage renal disease: Secondary | ICD-10-CM | POA: Diagnosis not present

## 2023-09-02 DIAGNOSIS — D631 Anemia in chronic kidney disease: Secondary | ICD-10-CM | POA: Diagnosis not present

## 2023-09-02 DIAGNOSIS — L97821 Non-pressure chronic ulcer of other part of left lower leg limited to breakdown of skin: Secondary | ICD-10-CM | POA: Diagnosis not present

## 2023-09-02 DIAGNOSIS — L97522 Non-pressure chronic ulcer of other part of left foot with fat layer exposed: Secondary | ICD-10-CM | POA: Diagnosis not present

## 2023-09-02 DIAGNOSIS — I132 Hypertensive heart and chronic kidney disease with heart failure and with stage 5 chronic kidney disease, or end stage renal disease: Secondary | ICD-10-CM | POA: Diagnosis not present

## 2023-09-02 DIAGNOSIS — Z992 Dependence on renal dialysis: Secondary | ICD-10-CM | POA: Diagnosis not present

## 2023-09-02 DIAGNOSIS — Z89611 Acquired absence of right leg above knee: Secondary | ICD-10-CM | POA: Diagnosis not present

## 2023-09-02 DIAGNOSIS — B9562 Methicillin resistant Staphylococcus aureus infection as the cause of diseases classified elsewhere: Secondary | ICD-10-CM

## 2023-09-02 DIAGNOSIS — R7881 Bacteremia: Secondary | ICD-10-CM | POA: Diagnosis not present

## 2023-09-02 DIAGNOSIS — I87312 Chronic venous hypertension (idiopathic) with ulcer of left lower extremity: Secondary | ICD-10-CM | POA: Diagnosis not present

## 2023-09-02 DIAGNOSIS — Z556 Problems related to health literacy: Secondary | ICD-10-CM

## 2023-09-02 DIAGNOSIS — E11621 Type 2 diabetes mellitus with foot ulcer: Secondary | ICD-10-CM | POA: Diagnosis not present

## 2023-09-02 DIAGNOSIS — E785 Hyperlipidemia, unspecified: Secondary | ICD-10-CM | POA: Diagnosis not present

## 2023-09-02 DIAGNOSIS — Z993 Dependence on wheelchair: Secondary | ICD-10-CM

## 2023-09-02 DIAGNOSIS — Z9889 Other specified postprocedural states: Secondary | ICD-10-CM

## 2023-09-02 DIAGNOSIS — I509 Heart failure, unspecified: Secondary | ICD-10-CM | POA: Diagnosis not present

## 2023-09-02 DIAGNOSIS — E1122 Type 2 diabetes mellitus with diabetic chronic kidney disease: Secondary | ICD-10-CM | POA: Diagnosis not present

## 2023-09-02 LAB — CATH TIP CULTURE: Culture: NO GROWTH

## 2023-09-02 MED ORDER — METRONIDAZOLE 500 MG/100ML IV SOLN
500.0000 mg | Freq: Two times a day (BID) | INTRAVENOUS | Status: DC
  Administered 2023-09-02 – 2023-09-07 (×10): 500 mg via INTRAVENOUS
  Filled 2023-09-02 (×11): qty 100

## 2023-09-02 MED ORDER — SENNA 8.6 MG PO TABS
1.0000 | ORAL_TABLET | Freq: Every day | ORAL | Status: DC
  Administered 2023-09-05 – 2023-09-07 (×2): 8.6 mg via ORAL
  Filled 2023-09-02 (×5): qty 1

## 2023-09-02 MED ORDER — VANCOMYCIN HCL IN DEXTROSE 1-5 GM/200ML-% IV SOLN
1000.0000 mg | Freq: Once | INTRAVENOUS | Status: AC
  Administered 2023-09-02: 1000 mg via INTRAVENOUS
  Filled 2023-09-02: qty 200

## 2023-09-02 NOTE — Progress Notes (Signed)
 Oxygen taken of at this time

## 2023-09-02 NOTE — Plan of Care (Signed)

## 2023-09-02 NOTE — Progress Notes (Signed)
 ID TELEHEALTH PROGRESS NOTE  63yo M with ESRD via femoral HD admitted for shortness of breath, imaging concerning for PNA but infectious work up revealed MRSA bacteremia with high suspicion of HD catheter related bacteremia. He underwent   Femoral tunneled dialysis catheter was removed 6-17/25. 6/18 excision of Artergraft jump graft and portions of his old thrombosed AV fistula as well as stents 6/18 had placement of 30cc triple lumen HD catheter to left fem vein   Micro results as follows: 6/19 blood cx pending 6/18 tissue staph aureus - sensitivities pending 6/17 blood cx NGTD 6/17 cath tip NGTD 6/16 blood cx MRSA and clostridium perfringes  IV abtx: initially on vancomycin  and piptazo  Imaging: Chest ct - scattered bilateral nodular ground glass airspace disease favoring bilateral pneumonia  TTE no veg  A/P: polymicrobial bacteremia (MRSA and c.perfringes) likely from infected vascular graft, now removed  - plan to add metronidazole  to cover clostridium in addition to vancomycin  - unusual to have clostridial bacteremia without gi source, would re-evaluate patient to see if GI source, or further left leg pain where you may need to image to ensure no areas of abscess - please get TEE to rule out endocarditis - for now planning minimum of 6 wk of vancomycin  with HD - will follow micro culture results  Ted Leonhart B. Levern Reader MD MPH [facility] [phone]

## 2023-09-02 NOTE — Progress Notes (Signed)
  Progress Note    09/02/2023 11:05 AM 1 Day Post-Op  Subjective:  Gregg Winchell is a 63 yo male now POD #1 from:  PROCEDURE: 1.   Excision of Artegraft jump graft and portions of his old thrombosed AV fistula as well as stents.  PROCEDURE: Ultrasound guidance for vascular access left femoral vein Placement of a 30 cm triple lumen dialysis catheter left femoral vein  Vitals:   09/02/23 0358 09/02/23 0904  BP: 101/67 (!) 106/51  Pulse: 61 (!) 58  Resp: 20 18  Temp: 98.2 F (36.8 C) 98.2 F (36.8 C)  SpO2: 100% 100%   Physical Exam: Cardiac:  RRR, normal S1 and S2.  No rubs clicks gallops or murmurs noted. Lungs: Clear on auscultation throughout.  Normal labored breathing.  No rales rhonchi or wheezing. Incisions: Left upper extremity incision.  Dressing clean dry and intact.  No seroma, hematoma or bleeding noted. Extremities: All extremities warm to touch with palpable pulses. Abdomen: Positive bowel sounds throughout soft, nontender and nondistended. Neurologic: AAOX3 answers all questions follows commands appropriately.  CBC    Component Value Date/Time   WBC 4.5 09/01/2023 0416   RBC 2.91 (L) 09/01/2023 0416   HGB 8.0 (L) 09/01/2023 0416   HGB 16.8 01/12/2023 1414   HCT 26.4 (L) 09/01/2023 0416   HCT 51.0 01/12/2023 1414   PLT 91 (L) 09/01/2023 0416   PLT 257 01/12/2023 1414   MCV 90.7 09/01/2023 0416   MCV 88 01/12/2023 1414   MCH 27.5 09/01/2023 0416   MCHC 30.3 09/01/2023 0416   RDW 18.5 (H) 09/01/2023 0416   RDW 14.8 01/12/2023 1414   LYMPHSABS 0.7 08/29/2023 1958   LYMPHSABS 1.0 01/12/2023 1414   MONOABS 0.5 08/29/2023 1958   EOSABS 0.2 08/29/2023 1958   EOSABS 0.2 01/12/2023 1414   BASOSABS 0.0 08/29/2023 1958   BASOSABS 0.1 01/12/2023 1414    BMET    Component Value Date/Time   NA 133 (L) 09/01/2023 0416   NA 136 04/14/2023 1610   K 4.3 09/01/2023 0416   CL 94 (L) 09/01/2023 0416   CO2 27 09/01/2023 0416   GLUCOSE 100 (H) 09/01/2023 0416    BUN 41 (H) 09/01/2023 0416   BUN 18 04/14/2023 1610   CREATININE 9.14 (H) 09/01/2023 0416   CALCIUM  8.2 (L) 09/01/2023 0416   GFRNONAA 6 (L) 09/01/2023 0416    INR    Component Value Date/Time   INR 1.3 (H) 08/05/2023 1305     Intake/Output Summary (Last 24 hours) at 09/02/2023 1105 Last data filed at 09/02/2023 1100 Gross per 24 hour  Intake 1320 ml  Output 25 ml  Net 1295 ml     Assessment/Plan:  64 y.o. male is s/p See Above 1 Day Post-Op   PLAN Pain medication as needed Ambulate multiple times throughout the day Okay to use temporary dialysis catheter in left groin for hemodialysis  Plan is to place dialysis permacatheter in the patient's chest early next week after his bacteremia clears. Patient will follow-up with vein and vascular surgery after discharged for workup to place new dialysis access.  DVT prophylaxis: Eliquis  2.5 mg twice daily and aspirin  81 mg daily   Annamaria [facility] 09/02/2023 11:05 AM

## 2023-09-02 NOTE — Anesthesia Postprocedure Evaluation (Signed)
 Anesthesia Post Note  Patient: Adam Howell  Procedure(s) Performed: REVISON OF ARTERIOVENOUS FISTULA; INSERTION OF TEMPORARY HEMODIALYSIS CATHETER IN LEFT GROIN (Left: Arm Lower)  Patient location during evaluation: PACU Anesthesia Type: General Level of consciousness: awake and alert Pain management: pain level controlled Vital Signs Assessment: post-procedure vital signs reviewed and stable Respiratory status: spontaneous breathing, nonlabored ventilation, respiratory function stable and patient connected to nasal cannula oxygen Cardiovascular status: blood pressure returned to baseline and stable Postop Assessment: no apparent nausea or vomiting Anesthetic complications: no   No notable events documented.   Last Vitals:  Vitals:   09/01/23 1949 09/02/23 0358  BP: (!) 99/57 101/67  Pulse: (!) 58 61  Resp: 20 20  Temp: 36.8 C 36.8 C  SpO2: 100% 100%    Last Pain:  Vitals:   09/02/23 0456  TempSrc:   PainSc: Asleep                 Lattie Poli

## 2023-09-02 NOTE — Progress Notes (Signed)
 [facility]  ROUNDING NOTE   Subjective:   Adam Howell is a 63 y.o male with past medical history of diabetes, hypertension, CAD, Rt AKA, anemia, CHF, and ESRD on dialysis. Patient presents to vascular surgery for revision of AVF. He is being admitted for Aspiration pneumonia (HCC) [J69.0] HCAP (healthcare-associated pneumonia) [J18.9] Pneumonia of both lungs due to infectious organism, unspecified part of lung [J18.9] Acute hypoxic respiratory failure (HCC) [J96.01]  Patient presented to the hospital for shortness of breath.  He was diagnosed with pneumonia and is being admitted for further management.  Blood cultures are positive for staph species and MRSA in addition to gram-positive rods.  Femoral tunneled dialysis catheter was removed 6-17/25.  Update: Patient underwent hemodialysis treatment yesterday. Appears to be recovering from excision of jump graft yesterday.   Objective:  Vital signs in last 24 hours:  Temp:  [98.2 F (36.8 C)-98.3 F (36.8 C)] 98.3 F (36.8 C) (06/19 1607) Pulse Rate:  [58-61] 60 (06/19 1607) Resp:  [18-20] 18 (06/19 1607) BP: (99-106)/(51-67) 99/66 (06/19 1607) SpO2:  [90 %-100 %] 90 % (06/19 1607) Weight:  [103.5 kg] 103.5 kg (06/19 0500)  Weight change:  Filed Weights   08/29/23 1958 08/31/23 0235 09/02/23 0500  Weight: 106.6 kg 97.7 kg 103.5 kg    Intake/Output: I/O last 3 completed shifts: In: 1334.8 [P.O.:480; I.V.:504.8; IV Piggyback:350] Out: 25 [Blood:25]   Intake/Output this shift:  Total I/O In: 360 [P.O.:360] Out: -   Physical Exam: General: No acute distress  Head: [place]/AT, hearing intact  Eyes: Anicteric  Lungs:  Bilateral coarse breath sounds  Heart: Regular rate and rhythm  Abdomen:  Soft, nontender, obese  Extremities: No peripheral edema  Right AKA  Neurologic: Awake, alert  Skin: No lesions  Access: Jump graft removed, left femoral temporary dialysis catheter placed    Basic Metabolic  Panel: Recent Labs  Lab 08/29/23 1958 08/30/23 0405 08/31/23 0347 09/01/23 0416  NA 138 138 133* 133*  K 4.4 4.7 4.3 4.3  CL 98 94* 95* 94*  CO2 25 31 25 27   GLUCOSE 97 94 87 100*  BUN 41* 47* 32* 41*  CREATININE 9.88* 10.35* 7.18* 9.14*  CALCIUM  8.3* 8.1* 8.0* 8.2*    Liver Function Tests: Recent Labs  Lab 08/29/23 1958  AST 22  ALT 13  ALKPHOS 69  BILITOT 0.9  PROT 6.7  ALBUMIN  2.7*   No results for input(s): LIPASE, AMYLASE in the last 168 hours. No results for input(s): AMMONIA in the last 168 hours.  CBC: Recent Labs  Lab 08/29/23 1958 08/30/23 0405 08/30/23 1743 08/31/23 0347 09/01/23 0416  WBC 3.8* 4.3  --  5.2 4.5  NEUTROABS 2.4  --   --   --   --   HGB 7.4* 7.0* 8.8* 8.7* 8.0*  HCT 25.1* 23.7* 28.9* 28.5* 26.4*  MCV 94.7 94.4  --  91.9 90.7  PLT 95* 99*  --  88* 91*    Cardiac Enzymes: No results for input(s): CKTOTAL, CKMB, CKMBINDEX, TROPONINI in the last 168 hours.  BNP: Invalid input(s): POCBNP  CBG: Recent Labs  Lab 09/01/23 1326  GLUCAP 96     Microbiology: Results for orders placed or performed during the hospital encounter of 08/29/23  Resp panel by RT-PCR (RSV, Flu A&B, Covid) Anterior Nasal Swab     Status: None   Collection Time: 08/29/23  8:00 PM   Specimen: Anterior Nasal Swab  Result Value Ref Range Status   SARS Coronavirus  2 by RT PCR NEGATIVE NEGATIVE Final    Comment: (NOTE) SARS-CoV-2 target nucleic acids are NOT DETECTED.  The SARS-CoV-2 RNA is generally detectable in upper respiratory specimens during the acute phase of infection. The lowest concentration of SARS-CoV-2 viral copies this assay can detect is 138 copies/mL. A negative result does not preclude SARS-Cov-2 infection and should not be used as the sole basis for treatment or other patient management decisions. A negative result may occur with  improper specimen collection/handling, submission of specimen other than nasopharyngeal swab,  presence of viral mutation(s) within the areas targeted by this assay, and inadequate number of viral copies(<138 copies/mL). A negative result must be combined with clinical observations, patient history, and epidemiological information. The expected result is Negative.  Fact Sheet for Patients:  BloggerCourse.com  Fact Sheet for Healthcare Providers:  SeriousBroker.it  This test is no t yet approved or cleared by the [place]  FDA and  has been authorized for detection and/or diagnosis of SARS-CoV-2 by FDA under an Emergency Use Authorization (EUA). This EUA will remain  in effect (meaning this test can be used) for the duration of the COVID-19 declaration under Section 564(b)(1) of the Act, 21 U.S.C.section 360bbb-3(b)(1), unless the authorization is terminated  or revoked sooner.       Influenza A by PCR NEGATIVE NEGATIVE Final   Influenza B by PCR NEGATIVE NEGATIVE Final    Comment: (NOTE) The Xpert Xpress SARS-CoV-2/FLU/RSV plus assay is intended as an aid in the diagnosis of influenza from Nasopharyngeal swab specimens and should not be used as a sole basis for treatment. Nasal washings and aspirates are unacceptable for Xpert Xpress SARS-CoV-2/FLU/RSV testing.  Fact Sheet for Patients: BloggerCourse.com  Fact Sheet for Healthcare Providers: SeriousBroker.it  This test is not yet approved or cleared by the [place]  FDA and has been authorized for detection and/or diagnosis of SARS-CoV-2 by FDA under an Emergency Use Authorization (EUA). This EUA will remain in effect (meaning this test can be used) for the duration of the COVID-19 declaration under Section 564(b)(1) of the Act, 21 U.S.C. section 360bbb-3(b)(1), unless the authorization is terminated or revoked.     Resp Syncytial Virus by PCR NEGATIVE NEGATIVE Final    Comment: (NOTE) Fact Sheet for  Patients: BloggerCourse.com  Fact Sheet for Healthcare Providers: SeriousBroker.it  This test is not yet approved or cleared by the [place]  FDA and has been authorized for detection and/or diagnosis of SARS-CoV-2 by FDA under an Emergency Use Authorization (EUA). This EUA will remain in effect (meaning this test can be used) for the duration of the COVID-19 declaration under Section 564(b)(1) of the Act, 21 U.S.C. section 360bbb-3(b)(1), unless the authorization is terminated or revoked.  Performed at [facility], [address] [place], [place] [address]   Blood Culture (routine x 2)     Status: Abnormal   Collection Time: 08/30/23 12:06 AM   Specimen: BLOOD RIGHT HAND  Result Value Ref Range Status   Specimen Description   Final    BLOOD RIGHT HAND Performed at [facility], [address], [place], [place] [address]    Special Requests   Final    BOTTLES DRAWN AEROBIC AND ANAEROBIC Blood Culture results may not be optimal due to an inadequate volume of blood received in culture bottles Performed at [facility], [address], [place], [place] [address]    Culture  Setup Time   Final    GRAM POSITIVE COCCI IN BOTH AEROBIC AND  ANAEROBIC BOTTLES CRITICAL RESULT CALLED TO, READ BACK BY AND VERIFIED WITH: Marguerite Shiley 08/30/23 1230 MW Performed at [facility], [address] [place], [place] [address]    Culture (A)  Final    STAPHYLOCOCCUS AUREUS SUSCEPTIBILITIES PERFORMED ON PREVIOUS CULTURE WITHIN THE LAST 5 DAYS. Performed at [facility], [address] [place], [place] [address]    Report Status 09/01/2023 FINAL  Final  Blood Culture (routine x 2)     Status: Abnormal   Collection Time: 08/30/23 12:06 AM   Specimen: BLOOD RIGHT ARM  Result Value Ref Range Status   Specimen Description   Final    BLOOD RIGHT ARM Performed at [facility], [address], [place], [place] [address]    Special Requests   Final    BOTTLES DRAWN AEROBIC AND ANAEROBIC Blood Culture results may not be optimal due to an inadequate volume of blood received in culture bottles Performed at [facility], [address] [place], [place] [address]    Culture  Setup Time   Final    GRAM POSITIVE COCCI IN BOTH AEROBIC AND ANAEROBIC BOTTLES CRITICAL RESULT CALLED TO, READ BACK BY AND VERIFIED WITH: TREY GREENWOOD 08/30/23 1230 MW GRAM POSITIVE RODS ANAEROBIC BOTTLE ONLY CRITICAL RESULT CALLED TO, READ BACK BY AND VERIFIED WITH: PHARMD W. ANDERSON [id number] @1700  FH Performed at [facility], [address], [place], [place] [address]    Culture (A)  Final    METHICILLIN RESISTANT STAPHYLOCOCCUS AUREUS CLOSTRIDIUM PERFRINGENS    Report Status 09/01/2023 FINAL  Final   Organism ID, Bacteria METHICILLIN RESISTANT STAPHYLOCOCCUS AUREUS  Final      Susceptibility   Methicillin resistant staphylococcus aureus - MIC*    CIPROFLOXACIN >=8 RESISTANT Resistant     ERYTHROMYCIN >=8 RESISTANT Resistant     GENTAMICIN <=0.5 SENSITIVE Sensitive     OXACILLIN >=4 RESISTANT Resistant     TETRACYCLINE >=16 RESISTANT Resistant     VANCOMYCIN  1 SENSITIVE Sensitive     TRIMETH /SULFA  <=10 SENSITIVE Sensitive     CLINDAMYCIN >=8 RESISTANT Resistant     RIFAMPIN <=0.5 SENSITIVE Sensitive     Inducible Clindamycin NEGATIVE Sensitive     LINEZOLID  2 SENSITIVE Sensitive     * METHICILLIN RESISTANT STAPHYLOCOCCUS AUREUS  Blood Culture ID Panel (Reflexed)     Status: Abnormal   Collection Time: 08/30/23 12:06 AM  Result Value Ref Range Status   Enterococcus faecalis NOT DETECTED NOT DETECTED Final   Enterococcus Faecium NOT DETECTED NOT DETECTED Final   Listeria monocytogenes NOT DETECTED NOT DETECTED Final   Staphylococcus species DETECTED (A) NOT DETECTED Final    Comment: CRITICAL RESULT CALLED TO, READ BACK BY AND VERIFIED WITH: TREY GREENWOOD 08/30/23 1230 MW     Staphylococcus aureus (BCID) DETECTED (A) NOT DETECTED Final    Comment: Methicillin (oxacillin)-resistant Staphylococcus aureus (MRSA). MRSA is predictably resistant to beta-lactam antibiotics (except ceftaroline). Preferred therapy is vancomycin  unless clinically contraindicated. Patient requires contact precautions if  hospitalized. CRITICAL RESULT CALLED TO, READ BACK BY AND VERIFIED WITH: TREY GREENWOOD 08/30/23 1230 MW    Staphylococcus epidermidis NOT DETECTED NOT DETECTED Final   Staphylococcus lugdunensis NOT DETECTED NOT DETECTED Final   Streptococcus species NOT DETECTED NOT DETECTED Final   Streptococcus agalactiae NOT DETECTED NOT DETECTED Final   Streptococcus pneumoniae NOT DETECTED NOT DETECTED Final   Streptococcus pyogenes NOT DETECTED NOT DETECTED Final   A.calcoaceticus-baumannii NOT DETECTED NOT DETECTED Final   Bacteroides fragilis  NOT DETECTED NOT DETECTED Final   Enterobacterales NOT DETECTED NOT DETECTED Final   Enterobacter cloacae complex NOT DETECTED NOT DETECTED Final   Escherichia coli NOT DETECTED NOT DETECTED Final   Klebsiella aerogenes NOT DETECTED NOT DETECTED Final   Klebsiella oxytoca NOT DETECTED NOT DETECTED Final   Klebsiella pneumoniae NOT DETECTED NOT DETECTED Final   Proteus species NOT DETECTED NOT DETECTED Final   Salmonella species NOT DETECTED NOT DETECTED Final   Serratia marcescens NOT DETECTED NOT DETECTED Final   Haemophilus influenzae NOT DETECTED NOT DETECTED Final   Neisseria meningitidis NOT DETECTED NOT DETECTED Final   Pseudomonas aeruginosa NOT DETECTED NOT DETECTED Final   Stenotrophomonas maltophilia NOT DETECTED NOT DETECTED Final   Candida albicans NOT DETECTED NOT DETECTED Final   Candida auris NOT DETECTED NOT DETECTED Final   Candida glabrata NOT DETECTED NOT DETECTED Final   Candida krusei NOT DETECTED NOT DETECTED Final   Candida parapsilosis NOT DETECTED NOT DETECTED Final   Candida tropicalis NOT DETECTED NOT  DETECTED Final   Cryptococcus neoformans/gattii NOT DETECTED NOT DETECTED Final   Meth resistant mecA/C and MREJ DETECTED (A) NOT DETECTED Final    Comment: CRITICAL RESULT CALLED TO, READ BACK BY AND VERIFIED WITH: Marguerite Shiley 08/30/23 1230 MW Performed at [facility], [address] [place], [place] [address]   MRSA Next Gen by PCR, Nasal     Status: Abnormal   Collection Time: 08/30/23  2:53 PM   Specimen: Nasal Mucosa; Nasal Swab  Result Value Ref Range Status   MRSA by PCR Next Gen DETECTED (A) NOT DETECTED Final    Comment: CRITICAL RESULT CALLED TO, READ BACK BY AND VERIFIED WITH:  Lorayne Rocks RN 08/30/2023 @1624  KKG (NOTE) The GeneXpert MRSA Assay (FDA approved for NASAL specimens only), is one component of a comprehensive MRSA colonization surveillance program. It is not intended to diagnose MRSA infection nor to guide or monitor treatment for MRSA infections. Test performance is not FDA approved in patients less than 2 years old. Performed at [facility], [address], [place], [place] [address]   Cath Tip Culture     Status: None   Collection Time: 08/31/23  8:50 AM   Specimen: Catheter Tip; Other  Result Value Ref Range Status   Specimen Description   Final    CATH TIP Performed at [facility], [address] [place], [place] [address]    Special Requests   Final    NONE Performed at [facility], [address] [place], [place] [address]    Culture   Final    NO GROWTH 2 DAYS Performed at [facility], [address], [place], [place] [address]    Report Status 09/02/2023 FINAL  Final  Culture, blood (single) w Reflex to ID Panel     Status: None (Preliminary result)   Collection Time: 08/31/23 10:12 AM   Specimen: BLOOD  Result Value Ref Range Status   Specimen Description BLOOD RIGHT FA  Final   Special Requests   Final    BOTTLES DRAWN AEROBIC AND ANAEROBIC Blood Culture adequate volume   Culture    Final    NO GROWTH < 24 HOURS Performed at [facility], [address], [place], [place] [address]    Report Status PENDING  Incomplete  Aerobic/Anaerobic Culture w Gram Stain (surgical/deep wound)     Status: None (Preliminary result)   Collection Time: 09/01/23 12:21 PM   Specimen: Path Tissue  Result Value Ref Range Status   Specimen Description   Final    TISSUE Performed at [facility], [address] [place], [place] [address]    Special Requests   Final    NONE Performed at [facility], [address] [place], [place] [address]    Gram Stain NO WBC SEEN NO ORGANISMS SEEN   Final   Culture   Final    FEW STAPHYLOCOCCUS AUREUS CULTURE REINCUBATED FOR BETTER GROWTH Performed at [facility], [address], [place], [place] [address]    Report Status PENDING  Incomplete    Coagulation Studies: No results for input(s): LABPROT, INR in the last 72 hours.   Urinalysis: No results for input(s): COLORURINE, LABSPEC, PHURINE, GLUCOSEU, HGBUR, BILIRUBINUR, KETONESUR, PROTEINUR, UROBILINOGEN, NITRITE, LEUKOCYTESUR in the last 72 hours.  Invalid input(s): APPERANCEUR    Imaging: ECHOCARDIOGRAM LIMITED Result Date: 09/01/2023    ECHOCARDIOGRAM LIMITED REPORT   Patient Name:   Adam Howell Date of Exam: 09/01/2023 Medical Rec #:  [id number]           Height:       75.0 in Accession #:    [id number]          Weight:       215.4 lb Date of Birth:  Jan 24, 1961           BSA:          2.265 m Patient Age:    63 years            BP:           112/59 mmHg Patient Gender: M                   HR:           60 bpm. Exam Location:  [facility] Procedure: Limited Echo, Limited Color Doppler and Cardiac Doppler (Both            Spectral and Color Flow Doppler were utilized during procedure). Indications:     Bacteremia  History:         Patient has prior history of Echocardiogram examinations, most                  recent  08/09/2023. CHF, CAD, end stage renal disease,                  Arrythmias:Atrial Fibrillation; Risk Factors:Dyslipidemia,                  Hypertension and Diabetes.  Sonographer:     Dione Franks RDCS Referring Phys:  [id number] Alphonsus Jeans Diagnosing Phys: Belva Boyden MD IMPRESSIONS  1. Left ventricular ejection fraction, by estimation, is 60 to 65%. The left ventricle has normal function. The left ventricle has no regional wall motion abnormalities. There is mild left ventricular hypertrophy.  2. Right ventricular systolic function is mildly reduced. The right ventricular size is moderately enlarged. There is severely elevated pulmonary artery systolic pressure. The estimated right ventricular systolic pressure is 64.0 mmHg.  3. The mitral valve is normal in structure. Mild mitral valve regurgitation. No evidence of mitral stenosis.  4. Tricuspid valve regurgitation is moderate.  5. The aortic valve is normal in structure. There is moderate calcification of the aortic valve. Aortic valve regurgitation is not visualized. Aortic valve sclerosis/calcification is present, without any evidence of aortic stenosis.  6. The inferior vena cava is dilated in size with <50% respiratory  variability, suggesting right atrial pressure of 15 mmHg. FINDINGS  Left Ventricle: Left ventricular ejection fraction, by estimation, is 60 to 65%. The left ventricle has normal function. The left ventricle has no regional wall motion abnormalities. The left ventricular internal cavity size was normal in size. There is  mild left ventricular hypertrophy. Right Ventricle: The right ventricular size is moderately enlarged. No increase in right ventricular wall thickness. Right ventricular systolic function is mildly reduced. There is severely elevated pulmonary artery systolic pressure. The tricuspid regurgitant velocity is 3.50 m/s, and with an assumed right atrial pressure of 15 mmHg, the estimated right ventricular systolic  pressure is 64.0 mmHg. Left Atrium: Left atrial size was normal in size. Right Atrium: Right atrial size was normal in size. Pericardium: There is no evidence of pericardial effusion. Mitral Valve: The mitral valve is normal in structure. Mild mitral annular calcification. Mild mitral valve regurgitation. No evidence of mitral valve stenosis. There is no evidence of mitral valve vegetation. Tricuspid Valve: The tricuspid valve is normal in structure. Tricuspid valve regurgitation is moderate . No evidence of tricuspid stenosis. There is no evidence of tricuspid valve vegetation. Aortic Valve: The aortic valve is normal in structure. There is moderate calcification of the aortic valve. Aortic valve regurgitation is not visualized. Aortic valve sclerosis/calcification is present, without any evidence of aortic stenosis. There is no evidence of aortic valve vegetation. Pulmonic Valve: The pulmonic valve was normal in structure. Pulmonic valve regurgitation is not visualized. No evidence of pulmonic stenosis. There is no evidence of pulmonic valve vegetation. Aorta: The aortic root is normal in size and structure. Venous: The inferior vena cava is dilated in size with less than 50% respiratory variability, suggesting right atrial pressure of 15 mmHg. IAS/Shunts: No atrial level shunt detected by color flow Doppler. Additional Comments: Spectral Doppler performed. Color Doppler performed.  LEFT VENTRICLE PLAX 2D LVIDd:         4.20 cm LVIDs:         2.70 cm LV PW:         1.50 cm LV IVS:        1.30 cm  IVC IVC diam: 2.80 cm LEFT ATRIUM         Index LA diam:    4.50 cm 1.99 cm/m  AORTIC VALVE LVOT Vmax:   123.00 cm/s LVOT Vmean:  65.700 cm/s LVOT VTI:    0.217 m  AORTA Ao Asc diam: 3.40 cm MITRAL VALVE                TRICUSPID VALVE MV Area (PHT): 4.24 cm     TR Peak grad:   49.0 mmHg MV Decel Time: 179 msec     TR Vmax:        350.00 cm/s MV E velocity: 124.00 cm/s MV A velocity: 29.60 cm/s   SHUNTS MV E/A ratio:   4.19         Systemic VTI: 0.22 m Belva Boyden MD Electronically signed by Belva Boyden MD Signature Date/Time: 09/01/2023/2:40:38 PM    Final (Updated)       Medications:    sodium chloride      anticoagulant sodium citrate      anticoagulant sodium citrate      vancomycin      vancomycin        sodium chloride    Intravenous Once   amiodarone   200 mg Oral BID   apixaban   2.5 mg Oral BID   aspirin  EC  81 mg Oral Daily  carvedilol   6.25 mg Oral Daily   Chlorhexidine  Gluconate Cloth  6 each Topical Q0600   epoetin  alfa-epbx (RETACRIT ) injection  10,000 Units Intravenous Q M,W,F-1800   iron  polysaccharides  150 mg Oral Daily   leptospermum manuka honey  1 Application Topical Daily   rosuvastatin   40 mg Oral Daily   senna  1 tablet Oral Daily   sevelamer  carbonate  2,400 mg Oral TID WC   traZODone   100 mg Oral QHS   acetaminophen , albuterol , alteplase , alteplase , alteplase , anticoagulant sodium citrate , anticoagulant sodium citrate , dextromethorphan-guaiFENesin , feeding supplement (NEPRO CARB STEADY), heparin , heparin , heparin , heparin , lidocaine  (PF), lidocaine -prilocaine , melatonin, midodrine , oxyCODONE -acetaminophen , pentafluoroprop-tetrafluoroeth, vancomycin   Assessment/ Plan:  [name] is a 63 y.o.  male with end stage renal disease on hemodialysis, diabetes, hypertension, CAD, Rt AKA, anemia, CHF who was admitted to [facility] on 08/29/2023 for revision of AVF. Patient had anaphylactic reaction and admitted to ICU.   [facility]/MWF/left AVG removed  End-stage renal disease with hyperkalemia on hemodialysis.   - Jump graft removed 09/01/2023.  Patient due for hemodialysis treatment again tomorrow using femoral dialysis catheter.  2. Anemia of chronic kidney disease Lab Results  Component Value Date   HGB 8.0 (L) 09/01/2023   Maintain the patient on Epogen  10,000 units IV with dialysis.  Monitor hemoglobin.  3. Secondary Hyperparathyroidism: with outpatient  labs:     Lab Results  Component Value Date   CALCIUM  8.2 (L) 09/01/2023   CAION 1.06 (L) 08/05/2023   PHOS 2.5 08/14/2023  Last phosphorus acceptable at 2.5.  Continue Parikh and monitor bone metabolism parameters.  Continue Renvela  at his current dosage.  4. Pneumonia, sepsis and shortness of breath - Blood cultures (6/16) positive for MRSA and gram-positive rods  - iv Abx as per hospitalist team     LOS: 4 Adam Howell 6/19/20255:28 PM

## 2023-09-02 NOTE — H&P (View-Only) (Signed)
  Progress Note    09/02/2023 11:05 AM 1 Day Post-Op  Subjective:  Adam Howell is a 63 yo male now POD #1 from:  PROCEDURE: 1.   Excision of Artegraft jump graft and portions of his old thrombosed AV fistula as well as stents.  PROCEDURE: Ultrasound guidance for vascular access left femoral vein Placement of a 30 cm triple lumen dialysis catheter left femoral vein  Vitals:   09/02/23 0358 09/02/23 0904  BP: 101/67 (!) 106/51  Pulse: 61 (!) 58  Resp: 20 18  Temp: 98.2 F (36.8 C) 98.2 F (36.8 C)  SpO2: 100% 100%   Physical Exam: Cardiac:  RRR, normal S1 and S2.  No rubs clicks gallops or murmurs noted. Lungs: Clear on auscultation throughout.  Normal labored breathing.  No rales rhonchi or wheezing. Incisions: Left upper extremity incision.  Dressing clean dry and intact.  No seroma, hematoma or bleeding noted. Extremities: All extremities warm to touch with palpable pulses. Abdomen: Positive bowel sounds throughout soft, nontender and nondistended. Neurologic: AAOX3 answers all questions follows commands appropriately.  CBC    Component Value Date/Time   WBC 4.5 09/01/2023 0416   RBC 2.91 (L) 09/01/2023 0416   HGB 8.0 (L) 09/01/2023 0416   HGB 16.8 01/12/2023 1414   HCT 26.4 (L) 09/01/2023 0416   HCT 51.0 01/12/2023 1414   PLT 91 (L) 09/01/2023 0416   PLT 257 01/12/2023 1414   MCV 90.7 09/01/2023 0416   MCV 88 01/12/2023 1414   MCH 27.5 09/01/2023 0416   MCHC 30.3 09/01/2023 0416   RDW 18.5 (H) 09/01/2023 0416   RDW 14.8 01/12/2023 1414   LYMPHSABS 0.7 08/29/2023 1958   LYMPHSABS 1.0 01/12/2023 1414   MONOABS 0.5 08/29/2023 1958   EOSABS 0.2 08/29/2023 1958   EOSABS 0.2 01/12/2023 1414   BASOSABS 0.0 08/29/2023 1958   BASOSABS 0.1 01/12/2023 1414    BMET    Component Value Date/Time   NA 133 (L) 09/01/2023 0416   NA 136 04/14/2023 1610   K 4.3 09/01/2023 0416   CL 94 (L) 09/01/2023 0416   CO2 27 09/01/2023 0416   GLUCOSE 100 (H) 09/01/2023 0416    BUN 41 (H) 09/01/2023 0416   BUN 18 04/14/2023 1610   CREATININE 9.14 (H) 09/01/2023 0416   CALCIUM  8.2 (L) 09/01/2023 0416   GFRNONAA 6 (L) 09/01/2023 0416    INR    Component Value Date/Time   INR 1.3 (H) 08/05/2023 1305     Intake/Output Summary (Last 24 hours) at 09/02/2023 1105 Last data filed at 09/02/2023 1100 Gross per 24 hour  Intake 1320 ml  Output 25 ml  Net 1295 ml     Assessment/Plan:  63 y.o. male is s/p See Above 1 Day Post-Op   PLAN Pain medication as needed Ambulate multiple times throughout the day Okay to use temporary dialysis catheter in left groin for hemodialysis  Plan is to place dialysis permacatheter in the patient's chest early next week after his bacteremia clears. Patient will follow-up with vein and vascular surgery after discharged for workup to place new dialysis access.  DVT prophylaxis: Eliquis  2.5 mg twice daily and aspirin  81 mg daily   Annamaria Barrette [facility] 09/02/2023 11:05 AM

## 2023-09-02 NOTE — TOC Progression Note (Signed)
 Transition of Care [facility]) - Progression Note    Patient Details  Name: Adam Howell MRN: [id number] Date of Birth: [birth year]  [facility] CM/SW Contact  Loman Risk, RN Phone Number: 09/02/2023, 1:36 PM  Clinical Narrative:     Met with patient at bedside to present bed offer He states that he is no longer interested in SNF, and wants to return home to his daughters house with resumption of home health through [facility].  Patient states that his daughter will likely be able to provide transportation at discharge    Expected Discharge Plan: Skilled Nursing Facility Barriers to Discharge: Continued Medical Work up  Expected Discharge Plan and Services     Post Acute Care Choice: Skilled Nursing Facility Living arrangements for the past 2 months: Single Family Home                                       Social Determinants of Health (SDOH) Interventions SDOH Screenings   Food Insecurity: No Food Insecurity (08/31/2023)  Housing: Low Risk  (08/31/2023)  Transportation Needs: No Transportation Needs (08/31/2023)  Utilities: Not At Risk (08/31/2023)  Alcohol Screen: Low Risk  (11/25/2021)  Depression (PHQ2-9): Low Risk  (04/14/2023)  Financial Resource Strain: Low Risk  (11/25/2021)  Physical Activity: Insufficiently Active (11/25/2021)  Social Connections: Moderately Isolated (12/29/2022)  Stress: No Stress Concern Present (11/25/2021)  Tobacco Use: Low Risk  (08/29/2023)    Readmission Risk Interventions    09/01/2023   11:18 AM 02/16/2023    6:21 PM  Readmission Risk Prevention Plan  Transportation Screening  Complete  PCP or Specialist Appt within 3-5 Days Complete   HRI or Home Care Consult Complete   Social Work Consult for Recovery Care Planning/Counseling Complete   Palliative Care Screening Not Applicable Not Applicable  Medication Review Oceanographer)  Complete

## 2023-09-02 NOTE — Progress Notes (Signed)
 PROGRESS NOTE  Adam Howell  ZOX:[id number] DOB: 11-18-1960 DOA: 08/29/2023 PCP: Lemar Pyles, NP   Adam Howell is a 63 y.o. male with medical history significant of ESRD-HD (MWF), HTN, HLD, DM, CAD, dCHF, A fib on Eliquis , amenia, chronic pain, s/p of right AKA, who presents with SOB.   Patient recently had complicated admission from 5/22 - 5/29.  At that time he presented for an elective AV fistula revision.  During admission he had hypotension with PermCath placement which was concerning for bleeding and possible IV contrast reaction, required intubation and vasopressor support, extubated on 5/26.  Pt was discharged with plans for dialysis using right groin permacatheter.     ED Course: WBC 3.8, hemoglobin 7.4 and platelet 95 (patient had WBC 10.0, hemoglobin 8.0 and platelet 215 on 08/16/2023), negative PCR for COVID, flu and RSV, troponin 107, potassium 4.4, bicarbonate 25, creatinine 9.88, BUN 41.  Temperature normal, blood pressure 98/61, heart rate 59, RR 24, oxygen saturation 95% on room air, which improved to 100% on 2 L oxygen.   CT of chest: 1. Scattered bilateral nodular ground-glass airspace disease, greatest in the lower lobes, favor bilateral pneumonia given clinical presentation. 2. Cardiomegaly. 3. Borderline mediastinal adenopathy, likely reactive. 4. Aortic Atherosclerosis (ICD10-I70.0). Coronary artery atherosclerosis.  Complex course with bacteremia, vascular access issues and HD. Dispo to SNF will be pending infection control and able complete ID guided course of antibiotics.    Assessment & Plan:   Principal Problem:   HCAP (healthcare-associated pneumonia) Active Problems:   Aspiration pneumonia (HCC)   CAD (coronary artery disease)   Myocardial injury   ESRD on dialysis [facility])   Atrial fibrillation, chronic (HCC)   Type 2 diabetes mellitus with ESRD (end-stage renal disease) (HCC)   Hyperlipidemia associated with type 2 diabetes mellitus  (HCC)   Chronic diastolic CHF (congestive heart failure) (HCC)   Chronic pain syndrome   Pancytopenia (HCC)   Anemia in ESRD (end-stage renal disease) (HCC)   Overweight (BMI 25.0-29.9)   Encounter for dialysis catheter care [facility])  Assessment and Plan:  HAP  vs. aspiration pneumonia: Does not meet criteria for sepsis. On 2Lnc - MRSA swab positive - wean from O2 as tolerated  MRSA bacteremia:possible HD catheter in R groin vs previous AVF site. S/p HD catheter removed by vasc surg on 08/31/23. Now has new temp cath on L groin. - continue vanco as per ID. TTE without vegetations. Repeat blood cultures drawn 6/19. ID following and recs apprec  - will need repeat cultures cleared - f/u catheter tip culture  ESRD: on HD MWF.  - Nephro following and recs  - vascular consulted. Temp cath removed 6/17 from R groin. Graft excision today. Placed temp cath on L groin.  Hx of CAD & myocardial injury: trop  107, no chest pain.  Likely due to demand ischemia versus decreased clearance of troponin in the setting of ESRD. Continue on aspirin , statin   Left shin wound: present on admission. Wound care consulted   PAD  right AKA. Uses a wheelchair. Continue w/ supportive care - PT/OT- recommending SNF.  -  continue on statin    Chronic a. fib:  - continue on amio, coreg , hold for MAP and/or HR <65.  - Continue on eliquis     DM2: well-controlled glucose on labs. No need for insulin  currently    Chronic diastolic CHF: volume/fluid management w/ HD    Chronic pain syndrome: continue on home dose of percocet  Bicytopenia: w/ likely ACD secondary to ESRD. S/p 1 unit of pRBCs transfuse so far. Repeat H&H are trending up. Platelets are labile.   DVT prophylaxis: eliquis  Code Status: full  Family Communication: none at bedside Disposition Plan: SNF  Level of care: Telemetry Medical  Status is: Inpatient Remains inpatient appropriate because: severity of illness, requiring IV  abxs  Consultants:  ID Nephro   Procedures:   Antimicrobials: vanco   Subjective: Pt reports feeling fine today. Denies pain at groin or arm where he underwent procedures yesterday. No bleeding.   Objective: Vitals:   09/01/23 1602 09/01/23 1949 09/02/23 0358 09/02/23 0500  BP: (!) 92/54 (!) 99/57 101/67   Pulse: (!) 54 (!) 58 61   Resp: 18 20 20    Temp: (!) 97.5 F (36.4 C) 98.2 F (36.8 C) 98.2 F (36.8 C)   TempSrc:  Oral Oral   SpO2: 100% 100% 100%   Weight:    103.5 kg  Height:        Intake/Output Summary (Last 24 hours) at 09/02/2023 0733 Last data filed at 09/02/2023 0400 Gross per 24 hour  Intake 1080 ml  Output 25 ml  Net 1055 ml   Filed Weights   08/29/23 1958 08/31/23 0235 09/02/23 0500  Weight: 106.6 kg 97.7 kg 103.5 kg    Examination: General exam: appears lethargic  Respiratory system: diminished breath sounds b/l   Cardiovascular system: S1 & S2+. No rubs, gallops or clicks.  Gastrointestinal system: Abdomen is nondistended, soft and nontender. Normal bowel sounds heard. Central nervous system: lethargic  Psychiatry: Judgement and insight appears at baseline. Flat mood and affect  Data Reviewed: I have personally reviewed following labs and imaging studies  CBC: Recent Labs  Lab 08/29/23 1958 08/30/23 0405 08/30/23 1743 08/31/23 0347 09/01/23 0416  WBC 3.8* 4.3  --  5.2 4.5  NEUTROABS 2.4  --   --   --   --   HGB 7.4* 7.0* 8.8* 8.7* 8.0*  HCT 25.1* 23.7* 28.9* 28.5* 26.4*  MCV 94.7 94.4  --  91.9 90.7  PLT 95* 99*  --  88* 91*   Basic Metabolic Panel: Recent Labs  Lab 08/29/23 1958 08/30/23 0405 08/31/23 0347 09/01/23 0416  NA 138 138 133* 133*  K 4.4 4.7 4.3 4.3  CL 98 94* 95* 94*  CO2 25 31 25 27   GLUCOSE 97 94 87 100*  BUN 41* 47* 32* 41*  CREATININE 9.88* 10.35* 7.18* 9.14*  CALCIUM  8.3* 8.1* 8.0* 8.2*   GFR: Estimated Creatinine Clearance: 10.9 mL/min (A) (by C-G formula based on SCr of 9.14 mg/dL (H)). Liver  Function Tests: Recent Labs  Lab 08/29/23 1958  AST 22  ALT 13  ALKPHOS 69  BILITOT 0.9  PROT 6.7  ALBUMIN  2.7*    Radiology Studies: ECHOCARDIOGRAM LIMITED Result Date: 09/01/2023    ECHOCARDIOGRAM LIMITED REPORT   Patient Name:   Adam Howell Date of Exam: 09/01/2023 Medical Rec #:  [id number]           Height:       75.0 in Accession #:    [id number]          Weight:       215.4 lb Date of Birth:  1961/02/23           BSA:          2.265 m Patient Age:    63 years            BP:  112/59 mmHg Patient Gender: M                   HR:           60 bpm. Exam Location:  [facility] Procedure: Limited Echo, Limited Color Doppler and Cardiac Doppler (Both            Spectral and Color Flow Doppler were utilized during procedure). Indications:     Bacteremia  History:         Patient has prior history of Echocardiogram examinations, most                  recent 08/09/2023. CHF, CAD, end stage renal disease,                  Arrythmias:Atrial Fibrillation; Risk Factors:Dyslipidemia,                  Hypertension and Diabetes.  Sonographer:     Dione Franks RDCS Referring Phys:  [id number] Alphonsus Jeans Diagnosing Phys: Belva Boyden MD IMPRESSIONS  1. Left ventricular ejection fraction, by estimation, is 60 to 65%. The left ventricle has normal function. The left ventricle has no regional wall motion abnormalities. There is mild left ventricular hypertrophy.  2. Right ventricular systolic function is mildly reduced. The right ventricular size is moderately enlarged. There is severely elevated pulmonary artery systolic pressure. The estimated right ventricular systolic pressure is 64.0 mmHg.  3. The mitral valve is normal in structure. Mild mitral valve regurgitation. No evidence of mitral stenosis.  4. Tricuspid valve regurgitation is moderate.  5. The aortic valve is normal in structure. There is moderate calcification of the aortic valve. Aortic valve regurgitation is not visualized. Aortic  valve sclerosis/calcification is present, without any evidence of aortic stenosis.  6. The inferior vena cava is dilated in size with <50% respiratory variability, suggesting right atrial pressure of 15 mmHg. FINDINGS  Left Ventricle: Left ventricular ejection fraction, by estimation, is 60 to 65%. The left ventricle has normal function. The left ventricle has no regional wall motion abnormalities. The left ventricular internal cavity size was normal in size. There is  mild left ventricular hypertrophy. Right Ventricle: The right ventricular size is moderately enlarged. No increase in right ventricular wall thickness. Right ventricular systolic function is mildly reduced. There is severely elevated pulmonary artery systolic pressure. The tricuspid regurgitant velocity is 3.50 m/s, and with an assumed right atrial pressure of 15 mmHg, the estimated right ventricular systolic pressure is 64.0 mmHg. Left Atrium: Left atrial size was normal in size. Right Atrium: Right atrial size was normal in size. Pericardium: There is no evidence of pericardial effusion. Mitral Valve: The mitral valve is normal in structure. Mild mitral annular calcification. Mild mitral valve regurgitation. No evidence of mitral valve stenosis. There is no evidence of mitral valve vegetation. Tricuspid Valve: The tricuspid valve is normal in structure. Tricuspid valve regurgitation is moderate . No evidence of tricuspid stenosis. There is no evidence of tricuspid valve vegetation. Aortic Valve: The aortic valve is normal in structure. There is moderate calcification of the aortic valve. Aortic valve regurgitation is not visualized. Aortic valve sclerosis/calcification is present, without any evidence of aortic stenosis. There is no evidence of aortic valve vegetation. Pulmonic Valve: The pulmonic valve was normal in structure. Pulmonic valve regurgitation is not visualized. No evidence of pulmonic stenosis. There is no evidence of pulmonic valve  vegetation. Aorta: The aortic root is normal in size and structure. Venous: The  inferior vena cava is dilated in size with less than 50% respiratory variability, suggesting right atrial pressure of 15 mmHg. IAS/Shunts: No atrial level shunt detected by color flow Doppler. Additional Comments: Spectral Doppler performed. Color Doppler performed.  LEFT VENTRICLE PLAX 2D LVIDd:         4.20 cm LVIDs:         2.70 cm LV PW:         1.50 cm LV IVS:        1.30 cm  IVC IVC diam: 2.80 cm LEFT ATRIUM         Index LA diam:    4.50 cm 1.99 cm/m  AORTIC VALVE LVOT Vmax:   123.00 cm/s LVOT Vmean:  65.700 cm/s LVOT VTI:    0.217 m  AORTA Ao Asc diam: 3.40 cm MITRAL VALVE                TRICUSPID VALVE MV Area (PHT): 4.24 cm     TR Peak grad:   49.0 mmHg MV Decel Time: 179 msec     TR Vmax:        350.00 cm/s MV E velocity: 124.00 cm/s MV A velocity: 29.60 cm/s   SHUNTS MV E/A ratio:  4.19         Systemic VTI: 0.22 m Belva Boyden MD Electronically signed by Belva Boyden MD Signature Date/Time: 09/01/2023/2:40:38 PM    Final (Updated)    PERIPHERAL VASCULAR CATHETERIZATION Result Date: 08/31/2023 See surgical note for result.    LOS: 4 days    Ree Candy, MD [facility]  If 7PM-7AM, please contact night-coverage [email or web address] 09/02/2023, 7:33 AM

## 2023-09-03 ENCOUNTER — Inpatient Hospital Stay: Admitting: Certified Registered Nurse Anesthetist

## 2023-09-03 ENCOUNTER — Encounter
Admission: EM | Disposition: A | Discharge status: Home Health | Payer: Self-pay | Source: Home / Self Care | Attending: Internal Medicine

## 2023-09-03 ENCOUNTER — Inpatient Hospital Stay
Admit: 2023-09-03 | Discharge: 2023-09-03 | Disposition: A | Discharge status: Home / Self Care | Attending: Cardiology | Admitting: Cardiology

## 2023-09-03 DIAGNOSIS — J9601 Acute respiratory failure with hypoxia: Secondary | ICD-10-CM

## 2023-09-03 DIAGNOSIS — N186 End stage renal disease: Secondary | ICD-10-CM | POA: Diagnosis not present

## 2023-09-03 DIAGNOSIS — R7881 Bacteremia: Secondary | ICD-10-CM | POA: Diagnosis not present

## 2023-09-03 DIAGNOSIS — I361 Nonrheumatic tricuspid (valve) insufficiency: Secondary | ICD-10-CM | POA: Diagnosis not present

## 2023-09-03 DIAGNOSIS — J189 Pneumonia, unspecified organism: Secondary | ICD-10-CM | POA: Diagnosis not present

## 2023-09-03 DIAGNOSIS — I38 Endocarditis, valve unspecified: Secondary | ICD-10-CM

## 2023-09-03 DIAGNOSIS — B9562 Methicillin resistant Staphylococcus aureus infection as the cause of diseases classified elsewhere: Secondary | ICD-10-CM | POA: Diagnosis not present

## 2023-09-03 HISTORY — PX: TEE WITHOUT CARDIOVERSION: SHX5443

## 2023-09-03 LAB — BPAM RBC
Blood Product Expiration Date: 202507152359
Blood Product Expiration Date: 202507182359
ISSUE DATE / TIME: 202506160947
Unit Type and Rh: 6200
Unit Type and Rh: 6200

## 2023-09-03 LAB — TYPE AND SCREEN
ABO/RH(D): A POS
Antibody Screen: NEGATIVE
Unit division: 0
Unit division: 0

## 2023-09-03 LAB — CBC
HCT: 24.2 % — ABNORMAL LOW (ref 39.0–52.0)
Hemoglobin: 7.7 g/dL — ABNORMAL LOW (ref 13.0–17.0)
MCH: 29.4 pg (ref 26.0–34.0)
MCHC: 31.8 g/dL (ref 30.0–36.0)
MCV: 92.4 fL (ref 80.0–100.0)
Platelets: 158 10*3/uL (ref 150–400)
RBC: 2.62 MIL/uL — ABNORMAL LOW (ref 4.22–5.81)
RDW: 18.2 % — ABNORMAL HIGH (ref 11.5–15.5)
WBC: 4.6 10*3/uL (ref 4.0–10.5)
nRBC: 0.6 % — ABNORMAL HIGH (ref 0.0–0.2)

## 2023-09-03 LAB — ECHO TEE

## 2023-09-03 LAB — RENAL FUNCTION PANEL
Albumin: 2.4 g/dL — ABNORMAL LOW (ref 3.5–5.0)
Anion gap: 13 (ref 5–15)
BUN: 60 mg/dL — ABNORMAL HIGH (ref 8–23)
CO2: 28 mmol/L (ref 22–32)
Calcium: 8.1 mg/dL — ABNORMAL LOW (ref 8.9–10.3)
Chloride: 94 mmol/L — ABNORMAL LOW (ref 98–111)
Creatinine, Ser: 12.41 mg/dL — ABNORMAL HIGH (ref 0.61–1.24)
GFR, Estimated: 4 mL/min — ABNORMAL LOW (ref >60)
Glucose, Bld: 142 mg/dL — ABNORMAL HIGH (ref 70–99)
Phosphorus: 5 mg/dL — ABNORMAL HIGH (ref 2.5–4.6)
Potassium: 4.7 mmol/L (ref 3.5–5.1)
Sodium: 135 mmol/L (ref 135–145)

## 2023-09-03 SURGERY — ECHOCARDIOGRAM, TRANSESOPHAGEAL
Anesthesia: General

## 2023-09-03 MED ORDER — EPOETIN ALFA-EPBX 10000 UNIT/ML IJ SOLN
INTRAMUSCULAR | Status: AC
  Filled 2023-09-03: qty 1

## 2023-09-03 MED ORDER — HEPARIN SODIUM (PORCINE) 1000 UNIT/ML IJ SOLN
INTRAMUSCULAR | Status: AC
  Filled 2023-09-03: qty 10

## 2023-09-03 MED ORDER — SODIUM CHLORIDE 0.9% FLUSH: 3.0000 mL | INTRAVENOUS | Status: DC | PRN

## 2023-09-03 MED ORDER — LIDOCAINE VISCOUS HCL 2 % MT SOLN
OROMUCOSAL | Status: AC
  Filled 2023-09-03: qty 15

## 2023-09-03 MED ORDER — PHENYLEPHRINE 80 MCG/ML (10ML) SYRINGE FOR IV PUSH (FOR BLOOD PRESSURE SUPPORT)
PREFILLED_SYRINGE | INTRAVENOUS | Status: DC | PRN
  Administered 2023-09-03 (×2): 160 ug via INTRAVENOUS

## 2023-09-03 MED ORDER — SODIUM CHLORIDE 0.9% FLUSH: 3.0000 mL | Freq: Two times a day (BID) | INTRAVENOUS | Status: DC

## 2023-09-03 MED ORDER — PROPOFOL 10 MG/ML IV BOLUS
INTRAVENOUS | Status: DC | PRN
  Administered 2023-09-03: 10 mg via INTRAVENOUS
  Administered 2023-09-03 (×2): 30 mg via INTRAVENOUS

## 2023-09-03 MED ORDER — VANCOMYCIN HCL IN DEXTROSE 1-5 GM/200ML-% IV SOLN
1000.0000 mg | INTRAVENOUS | Status: DC
  Administered 2023-09-06: 1000 mg via INTRAVENOUS
  Filled 2023-09-03 (×2): qty 200

## 2023-09-03 MED ORDER — RENA-VITE PO TABS
1.0000 | ORAL_TABLET | Freq: Every day | ORAL | Status: DC
  Administered 2023-09-03 – 2023-09-07 (×5): 1 via ORAL
  Filled 2023-09-03 (×5): qty 1

## 2023-09-03 MED ORDER — BUTAMBEN-TETRACAINE-BENZOCAINE 2-2-14 % EX AERO
INHALATION_SPRAY | CUTANEOUS | Status: AC
  Filled 2023-09-03: qty 5

## 2023-09-03 MED ORDER — EPOETIN ALFA-EPBX 10000 UNIT/ML IJ SOLN
10000.0000 [IU] | INTRAMUSCULAR | Status: DC
  Administered 2023-09-03 – 2023-09-06 (×2): 10000 [IU] via INTRAVENOUS
  Filled 2023-09-03: qty 2

## 2023-09-03 NOTE — Anesthesia Preprocedure Evaluation (Signed)
 Anesthesia Evaluation  Patient identified by MRN, date of birth, ID band Patient awake    Reviewed: Allergy & Precautions, NPO status , Patient's Chart, lab work & pertinent test results  Airway Mallampati: II  TM Distance: >3 FB Neck ROM: full    Dental  (+) Teeth Intact   Pulmonary neg pulmonary ROS   Pulmonary exam normal  + decreased breath sounds      Cardiovascular Exercise Tolerance: Poor hypertension, Pt. on medications + CAD, + Peripheral Vascular Disease, +CHF and + DOE  negative cardio ROS Normal cardiovascular exam+ dysrhythmias Atrial Fibrillation  Rhythm:Irregular     Neuro/Psych negative neurological ROS  negative psych ROS   GI/Hepatic negative GI ROS, Neg liver ROS,,,  Endo/Other  negative endocrine ROSdiabetes, Type 2  Class 3 obesity  Renal/GU Dialysis and ESRFRenal diseasenegative Renal ROS  negative genitourinary   Musculoskeletal   Abdominal  (+) + obese  Peds  Hematology negative hematology ROS (+) Blood dyscrasia, anemia   Anesthesia Other Findings Past Medical History: No date: Anemia in chronic kidney disease No date: Cellulitis of left lower extremity No date: Chronic atrial fibrillation (HCC) No date: Chronic kidney disease No date: Congestive heart failure (HCC) No date: Difficult intubation No date: End-stage renal disease on hemodialysis (HCC) No date: Hyperlipidemia No date: Hypertension No date: Type 2 diabetes mellitus with complication [facility])  Past Surgical History: 08/27/2022: A/V FISTULAGRAM; Left     Comment:  Procedure: A/V Fistulagram;  Surgeon: Celso College, MD;               Location: [facility] INVASIVE CV LAB;  Service: Cardiovascular;              Laterality: Left; 08/12/2023: A/V FISTULAGRAM; Left     Comment:  Procedure: A/V Fistulagram;  Surgeon: Celso College, MD;               Location: [facility] INVASIVE CV LAB;  Service: Cardiovascular;              Laterality:  Left; 03/13/2022: AMPUTATION; Right     Comment:  Procedure: AMPUTATION ABOVE KNEE;  Surgeon: Celso College, MD;  Location: [facility] ORS;  Service: General;                Laterality: Right; 03/07/2022: APPLICATION OF WOUND VAC; Right     Comment:  Procedure: APPLICATION OF WOUND VAC;  Surgeon: Lesta Rater, MD;  Location: [facility] ORS;  Service: Vascular;                Laterality: Right;  GAAC [id number] 08/05/2023: CENTRAL LINE INSERTION; Left     Comment:  Procedure: CENTRAL LINE INSERTION;  Surgeon: Celso College, MD;  Location: [facility] ORS;  Service: Vascular;                Laterality: Left; 08/05/2023: DIALYSIS/PERMA CATHETER INSERTION; Right     Comment:  Procedure: DIALYSIS/PERMA CATHETER INSERTION;  Surgeon:               Celso College, MD;  Location: [facility] ORS;  Service:               Vascular;  Laterality: Right; 08/31/2023: DIALYSIS/PERMA CATHETER REMOVAL; Right     Comment:  Procedure: DIALYSIS/PERMA CATHETER REMOVAL;  Surgeon:               Jackquelyn Mass, MD;  Location: [facility] INVASIVE CV LAB;               Service: Cardiovascular;  Laterality: Right; 08/05/2023: GROIN DISSECTION; Right     Comment:  Procedure: right groin venogram, angioplasty of superior              venacava and anomic vein;  Surgeon: Celso College, MD;                Location: [facility] ORS;  Service: Vascular;  Laterality:               Right; 02/15/2023: LOWER EXTREMITY ANGIOGRAPHY; Left     Comment:  Procedure: Lower Extremity Angiography;  Surgeon: Celso College, MD;  Location: [facility] INVASIVE CV LAB;  Service:               Cardiovascular;  Laterality: Left; 08/05/2023: REVISON OF ARTERIOVENOUS FISTULA; Left     Comment:  Procedure: REVISON OF ARTERIOVENOUS FISTULA;  Surgeon:               Celso College, MD;  Location: [facility] ORS;  Service:               Vascular;  Laterality: Left;  JUMP GRAFT 09/01/2023: REVISON OF ARTERIOVENOUS FISTULA; Left     Comment:   Procedure: REVISON OF ARTERIOVENOUS FISTULA; INSERTION               OF TEMPORARY HEMODIALYSIS CATHETER IN LEFT GROIN;                Surgeon: Celso College, MD;  Location: [facility] ORS;  Service:              Vascular;  Laterality: Left;  excision of AV fistula               graft No date: WOUND DEBRIDEMENT; Left 01/16/2022: WOUND DEBRIDEMENT; Right     Comment:  Procedure: DEBRIDEMENT WOUND;  Surgeon: Jackquelyn Mass, MD;  Location: [facility] ORS;  Service: Vascular;                Laterality: Right;  Wound vac placement 03/07/2022: WOUND DEBRIDEMENT; Right     Comment:  Procedure: DEBRIDEMENT WOUND RIGHT LOWER EXTREMITY;                Surgeon: Lesta Rater, MD;  Location: [facility] ORS;                Service: Vascular;  Laterality: Right;  BMI    Body Mass Index: 28.38 kg/m      Reproductive/Obstetrics negative OB ROS                             Anesthesia Physical Anesthesia Plan  ASA: 3  Anesthesia Plan: General   Post-op Pain Management:    Induction: Intravenous  PONV Risk Score and Plan: Propofol  infusion and TIVA  Airway Management Planned: Natural Airway and Nasal Cannula  Additional Equipment:   Intra-op Plan:   Post-operative Plan:   Informed Consent: I have reviewed the patients History and Physical, chart, labs and discussed the procedure including the risks, benefits  and alternatives for the proposed anesthesia with the patient or authorized representative who has indicated his/her understanding and acceptance.     Dental Advisory Given  Plan Discussed with: CRNA  Anesthesia Plan Comments:        Anesthesia Quick Evaluation

## 2023-09-03 NOTE — Progress Notes (Signed)
 Pharmacy Antibiotic Note  Adam Howell is a 63 y.o. male w/ PMH of ESRD-HD (MWF), HTN, HLD, DM-diet controlled, CAD, dCHF, A fib on Eliquis , amenia, chronic pain, s/p of right AKA  admitted on 08/29/2023 with HCAP now confirmed MRSA bacteremia.  Femoral tunneled dialysis catheter was  removed  06/17/250 now w/ new temp cath and HD completed today 09/03/23. Pharmacy has been consulted for vancomycin  dosing.  Plan: continue vancomycin  1000 mg IV following dialysis MWF --will obtain level prior to 3rd maintenance HD dose  --goal vancomycin  level 15 - 25 mcg/mL Pharmacy will continue to follow and will adjust abx dosing whenever warranted.  Temp (24hrs), Avg:98.1 F (36.7 C), Min:97.9 F (36.6 C), Max:98.3 F (36.8 C)   Recent Labs  Lab 08/29/23 1958 08/30/23 0405 08/31/23 0347 09/01/23 0416 09/03/23 0417  WBC 3.8* 4.3 5.2 4.5  --   CREATININE 9.88* 10.35* 7.18* 9.14* 12.41*    Estimated Creatinine Clearance: 8 mL/min (A) (by C-G formula based on SCr of 12.41 mg/dL (H)).    Allergies  Allergen Reactions   Ivp Dye [Iodinated Contrast Media] Anaphylaxis   Tramadol  Rash    Antimicrobials this admission: 6/16 Zosyn  >>  6/16 vancomycin  >>   Microbiology results: 6/16 BCx: MRSA 6/18 BCx: S aureus (pending) 6/19 Bcx: NGTD 6/16 Sputum: Sent   Thank you for allowing pharmacy to be a part of this patient's care.  Barney Boozer, PharmD, BCPS 09/03/2023 7:09 AM

## 2023-09-03 NOTE — Progress Notes (Signed)
 Transesophageal Echocardiogram :  Indication: Bacteremia, rule out endocarditis Requesting/ordering  physician:   Procedure: Benzocaine spray x2 and 2 mls x 2 of viscous lidocaine  were given orally to provide local anesthesia to the oropharynx. The patient was positioned supine on the left side, bite block provided. The patient was moderately sedated with the doses of versed  and fentanyl  as detailed below.  Using digital technique an omniplane probe was advanced into the distal esophagus without incident.   Moderate sedation: 1. Sedation used: Per anesthesia team  See report in EPIC  for complete details: In brief, No valve vegetation noted  transgastric imaging revealed normal LV function with no RWMAs and no mural apical thrombus.  .  Estimated ejection fraction was 55%.  Right sided cardiac chambers were normal with no evidence of pulmonary hypertension.  Imaging of the septum showed no ASD or VSD Bubble study was negative for shunt 2D and color flow confirmed no PFO  Moderate TR Right ventricular systolic pressure 58 mmHg plus RA pressure consistent with moderate to severe pulmonary hypertension  The LA was well visualized in orthogonal views.  There was no spontaneous contrast and no thrombus in the LA and LA appendage   The descending thoracic aorta had no  mural aortic debris with no evidence of aneurysmal dilation or dissection Moderate atherosclerosis in the arch and descending aorta   Kayleana Waites 09/03/2023 1:34 PM

## 2023-09-03 NOTE — Progress Notes (Signed)
   [facility] has been requested to perform a transesophageal echocardiogram on [name] for bacteremia.     The patient does NOT have any absolute or relative contraindications to a Transesophageal Echocardiogram (TEE).  The patient has: No other conditions that may impact this procedure.    After careful review of history and examination, the risks and benefits of transesophageal echocardiogram have been explained including risks of esophageal damage, perforation (1:10,000 risk), bleeding, pharyngeal hematoma as well as other potential complications associated with conscious sedation including aspiration, arrhythmia, respiratory failure and death. Alternatives to treatment were discussed, questions were answered. Patient is willing to proceed.   Signed, Nika Yazzie, NP  09/03/2023 8:55 AM

## 2023-09-03 NOTE — Progress Notes (Signed)
*  PRELIMINARY RESULTS* Echocardiogram Echocardiogram Transesophageal has been performed.  Adam Howell 09/03/2023, 1:38 PM

## 2023-09-03 NOTE — Progress Notes (Signed)
 [facility]  ROUNDING NOTE   Subjective:   Adam Howell is a 63 y.o male with past medical history of diabetes, hypertension, CAD, Rt AKA, anemia, CHF, and ESRD on dialysis. Patient presents to vascular surgery for revision of AVF. He is being admitted for Aspiration pneumonia (HCC) [J69.0] HCAP (healthcare-associated pneumonia) [J18.9] Pneumonia of both lungs due to infectious organism, unspecified part of lung [J18.9] Acute hypoxic respiratory failure (HCC) [J96.01]  Patient presented to the hospital for shortness of breath.  He was diagnosed with pneumonia and is being admitted for further management.  Blood cultures are positive for staph species and MRSA in addition to gram-positive rods.  Femoral tunneled dialysis catheter was removed 6-17/25.  Update: Patient seen and evaluated during hemodialysis treatment today. Tolerating well. Due for TEE as well.   Objective:  Vital signs in last 24 hours:  Temp:  [97.7 F (36.5 C)-98.3 F (36.8 C)] 97.7 F (36.5 C) (06/20 0826) Pulse Rate:  [56-98] 64 (06/20 1200) Resp:  [12-22] 19 (06/20 1200) BP: (99-133)/(57-80) 102/62 (06/20 1200) SpO2:  [90 %-100 %] 96 % (06/20 1200) Weight:  [104.4 kg] 104.4 kg (06/20 0824)  Weight change:  Filed Weights   08/31/23 0235 09/02/23 0500 09/03/23 0824  Weight: 97.7 kg 103.5 kg 104.4 kg    Intake/Output: I/O last 3 completed shifts: In: 1237.3 [P.O.:1080; IV Piggyback:157.3] Out: -    Intake/Output this shift:  No intake/output data recorded.  Physical Exam: General: No acute distress  Head: [place]/AT, hearing intact  Eyes: Anicteric  Lungs:  Bilateral coarse breath sounds  Heart: Regular rate and rhythm  Abdomen:  Soft, nontender, obese  Extremities: No peripheral edema  Right AKA  Neurologic: Awake, alert  Skin: No lesions  Access: Jump graft removed, left femoral temporary dialysis catheter placed    Basic Metabolic Panel: Recent Labs  Lab 08/29/23 1958  08/30/23 0405 08/31/23 0347 09/01/23 0416 09/03/23 0417  NA 138 138 133* 133* 135  K 4.4 4.7 4.3 4.3 4.7  CL 98 94* 95* 94* 94*  CO2 25 31 25 27 28   GLUCOSE 97 94 87 100* 142*  BUN 41* 47* 32* 41* 60*  CREATININE 9.88* 10.35* 7.18* 9.14* 12.41*  CALCIUM  8.3* 8.1* 8.0* 8.2* 8.1*  PHOS  --   --   --   --  5.0*    Liver Function Tests: Recent Labs  Lab 08/29/23 1958 09/03/23 0417  AST 22  --   ALT 13  --   ALKPHOS 69  --   BILITOT 0.9  --   PROT 6.7  --   ALBUMIN  2.7* 2.4*   No results for input(s): LIPASE, AMYLASE in the last 168 hours. No results for input(s): AMMONIA in the last 168 hours.  CBC: Recent Labs  Lab 08/29/23 1958 08/30/23 0405 08/30/23 1743 08/31/23 0347 09/01/23 0416 09/03/23 0417  WBC 3.8* 4.3  --  5.2 4.5 4.6  NEUTROABS 2.4  --   --   --   --   --   HGB 7.4* 7.0* 8.8* 8.7* 8.0* 7.7*  HCT 25.1* 23.7* 28.9* 28.5* 26.4* 24.2*  MCV 94.7 94.4  --  91.9 90.7 92.4  PLT 95* 99*  --  88* 91* 158    Cardiac Enzymes: No results for input(s): CKTOTAL, CKMB, CKMBINDEX, TROPONINI in the last 168 hours.  BNP: Invalid input(s): POCBNP  CBG: Recent Labs  Lab 09/01/23 1326  GLUCAP 96     Microbiology: Results for orders placed or performed  during the hospital encounter of 08/29/23  Resp panel by RT-PCR (RSV, Flu A&B, Covid) Anterior Nasal Swab     Status: None   Collection Time: 08/29/23  8:00 PM   Specimen: Anterior Nasal Swab  Result Value Ref Range Status   SARS Coronavirus 2 by RT PCR NEGATIVE NEGATIVE Final    Comment: (NOTE) SARS-CoV-2 target nucleic acids are NOT DETECTED.  The SARS-CoV-2 RNA is generally detectable in upper respiratory specimens during the acute phase of infection. The lowest concentration of SARS-CoV-2 viral copies this assay can detect is 138 copies/mL. A negative result does not preclude SARS-Cov-2 infection and should not be used as the sole basis for treatment or other patient management  decisions. A negative result may occur with  improper specimen collection/handling, submission of specimen other than nasopharyngeal swab, presence of viral mutation(s) within the areas targeted by this assay, and inadequate number of viral copies(<138 copies/mL). A negative result must be combined with clinical observations, patient history, and epidemiological information. The expected result is Negative.  Fact Sheet for Patients:  BloggerCourse.com  Fact Sheet for Healthcare Providers:  SeriousBroker.it  This test is no t yet approved or cleared by the [place]  FDA and  has been authorized for detection and/or diagnosis of SARS-CoV-2 by FDA under an Emergency Use Authorization (EUA). This EUA will remain  in effect (meaning this test can be used) for the duration of the COVID-19 declaration under Section 564(b)(1) of the Act, 21 U.S.C.section 360bbb-3(b)(1), unless the authorization is terminated  or revoked sooner.       Influenza A by PCR NEGATIVE NEGATIVE Final   Influenza B by PCR NEGATIVE NEGATIVE Final    Comment: (NOTE) The Xpert Xpress SARS-CoV-2/FLU/RSV plus assay is intended as an aid in the diagnosis of influenza from Nasopharyngeal swab specimens and should not be used as a sole basis for treatment. Nasal washings and aspirates are unacceptable for Xpert Xpress SARS-CoV-2/FLU/RSV testing.  Fact Sheet for Patients: BloggerCourse.com  Fact Sheet for Healthcare Providers: SeriousBroker.it  This test is not yet approved or cleared by the [place]  FDA and has been authorized for detection and/or diagnosis of SARS-CoV-2 by FDA under an Emergency Use Authorization (EUA). This EUA will remain in effect (meaning this test can be used) for the duration of the COVID-19 declaration under Section 564(b)(1) of the Act, 21 U.S.C. section 360bbb-3(b)(1), unless the  authorization is terminated or revoked.     Resp Syncytial Virus by PCR NEGATIVE NEGATIVE Final    Comment: (NOTE) Fact Sheet for Patients: BloggerCourse.com  Fact Sheet for Healthcare Providers: SeriousBroker.it  This test is not yet approved or cleared by the [place]  FDA and has been authorized for detection and/or diagnosis of SARS-CoV-2 by FDA under an Emergency Use Authorization (EUA). This EUA will remain in effect (meaning this test can be used) for the duration of the COVID-19 declaration under Section 564(b)(1) of the Act, 21 U.S.C. section 360bbb-3(b)(1), unless the authorization is terminated or revoked.  Performed at [facility], [address] [place], [place] [address]   Blood Culture (routine x 2)     Status: Abnormal   Collection Time: 08/30/23 12:06 AM   Specimen: BLOOD RIGHT HAND  Result Value Ref Range Status   Specimen Description   Final    BLOOD RIGHT HAND Performed at [facility], [address], [place], [place] [address]    Special Requests   Final    BOTTLES DRAWN AEROBIC AND ANAEROBIC Blood Culture  results may not be optimal due to an inadequate volume of blood received in culture bottles Performed at [facility], [address] [place], [place] [address]    Culture  Setup Time   Final    GRAM POSITIVE COCCI IN BOTH AEROBIC AND ANAEROBIC BOTTLES CRITICAL RESULT CALLED TO, READ BACK BY AND VERIFIED WITH: Marguerite Shiley 08/30/23 1230 MW Performed at [facility], [address] [place], [place] [address]    Culture (A)  Final    STAPHYLOCOCCUS AUREUS SUSCEPTIBILITIES PERFORMED ON PREVIOUS CULTURE WITHIN THE LAST 5 DAYS. Performed at [facility], [address], [place], [place] [address]    Report Status 09/01/2023 FINAL  Final  Blood Culture (routine x 2)     Status: Abnormal   Collection Time: 08/30/23 12:06 AM   Specimen: BLOOD RIGHT  ARM  Result Value Ref Range Status   Specimen Description   Final    BLOOD RIGHT ARM Performed at [facility], [address] [place], [place] [address]    Special Requests   Final    BOTTLES DRAWN AEROBIC AND ANAEROBIC Blood Culture results may not be optimal due to an inadequate volume of blood received in culture bottles Performed at [facility], [address] [place], [place] [address]    Culture  Setup Time   Final    GRAM POSITIVE COCCI IN BOTH AEROBIC AND ANAEROBIC BOTTLES CRITICAL RESULT CALLED TO, READ BACK BY AND VERIFIED WITH: TREY GREENWOOD 08/30/23 1230 MW GRAM POSITIVE RODS ANAEROBIC BOTTLE ONLY CRITICAL RESULT CALLED TO, READ BACK BY AND VERIFIED WITH: PHARMD W. ANDERSON [id number] @1700  FH Performed at [facility], [address] [place], [place] [address]    Culture (A)  Final    METHICILLIN RESISTANT STAPHYLOCOCCUS AUREUS CLOSTRIDIUM PERFRINGENS    Report Status 09/01/2023 FINAL  Final   Organism ID, Bacteria METHICILLIN RESISTANT STAPHYLOCOCCUS AUREUS  Final      Susceptibility   Methicillin resistant staphylococcus aureus - MIC*    CIPROFLOXACIN >=8 RESISTANT Resistant     ERYTHROMYCIN >=8 RESISTANT Resistant     GENTAMICIN <=0.5 SENSITIVE Sensitive     OXACILLIN >=4 RESISTANT Resistant     TETRACYCLINE >=16 RESISTANT Resistant     VANCOMYCIN  1 SENSITIVE Sensitive     TRIMETH /SULFA  <=10 SENSITIVE Sensitive     CLINDAMYCIN >=8 RESISTANT Resistant     RIFAMPIN <=0.5 SENSITIVE Sensitive     Inducible Clindamycin NEGATIVE Sensitive     LINEZOLID  2 SENSITIVE Sensitive     * METHICILLIN RESISTANT STAPHYLOCOCCUS AUREUS  Blood Culture ID Panel (Reflexed)     Status: Abnormal   Collection Time: 08/30/23 12:06 AM  Result Value Ref Range Status   Enterococcus faecalis NOT DETECTED NOT DETECTED Final   Enterococcus Faecium NOT DETECTED NOT DETECTED Final   Listeria monocytogenes NOT DETECTED NOT DETECTED Final   Staphylococcus species  DETECTED (A) NOT DETECTED Final    Comment: CRITICAL RESULT CALLED TO, READ BACK BY AND VERIFIED WITH: TREY GREENWOOD 08/30/23 1230 MW    Staphylococcus aureus (BCID) DETECTED (A) NOT DETECTED Final    Comment: Methicillin (oxacillin)-resistant Staphylococcus aureus (MRSA). MRSA is predictably resistant to beta-lactam antibiotics (except ceftaroline). Preferred therapy is vancomycin  unless clinically contraindicated. Patient requires contact precautions if  hospitalized. CRITICAL RESULT CALLED TO, READ BACK BY AND VERIFIED WITH: TREY GREENWOOD 08/30/23 1230 MW    Staphylococcus epidermidis NOT DETECTED NOT DETECTED Final   Staphylococcus lugdunensis NOT DETECTED NOT DETECTED Final  Streptococcus species NOT DETECTED NOT DETECTED Final   Streptococcus agalactiae NOT DETECTED NOT DETECTED Final   Streptococcus pneumoniae NOT DETECTED NOT DETECTED Final   Streptococcus pyogenes NOT DETECTED NOT DETECTED Final   A.calcoaceticus-baumannii NOT DETECTED NOT DETECTED Final   Bacteroides fragilis NOT DETECTED NOT DETECTED Final   Enterobacterales NOT DETECTED NOT DETECTED Final   Enterobacter cloacae complex NOT DETECTED NOT DETECTED Final   Escherichia coli NOT DETECTED NOT DETECTED Final   Klebsiella aerogenes NOT DETECTED NOT DETECTED Final   Klebsiella oxytoca NOT DETECTED NOT DETECTED Final   Klebsiella pneumoniae NOT DETECTED NOT DETECTED Final   Proteus species NOT DETECTED NOT DETECTED Final   Salmonella species NOT DETECTED NOT DETECTED Final   Serratia marcescens NOT DETECTED NOT DETECTED Final   Haemophilus influenzae NOT DETECTED NOT DETECTED Final   Neisseria meningitidis NOT DETECTED NOT DETECTED Final   Pseudomonas aeruginosa NOT DETECTED NOT DETECTED Final   Stenotrophomonas maltophilia NOT DETECTED NOT DETECTED Final   Candida albicans NOT DETECTED NOT DETECTED Final   Candida auris NOT DETECTED NOT DETECTED Final   Candida glabrata NOT DETECTED NOT DETECTED Final   Candida  krusei NOT DETECTED NOT DETECTED Final   Candida parapsilosis NOT DETECTED NOT DETECTED Final   Candida tropicalis NOT DETECTED NOT DETECTED Final   Cryptococcus neoformans/gattii NOT DETECTED NOT DETECTED Final   Meth resistant mecA/C and MREJ DETECTED (A) NOT DETECTED Final    Comment: CRITICAL RESULT CALLED TO, READ BACK BY AND VERIFIED WITH: Marguerite Shiley 08/30/23 1230 MW Performed at [facility], [address] [place], [place] [address]   MRSA Next Gen by PCR, Nasal     Status: Abnormal   Collection Time: 08/30/23  2:53 PM   Specimen: Nasal Mucosa; Nasal Swab  Result Value Ref Range Status   MRSA by PCR Next Gen DETECTED (A) NOT DETECTED Final    Comment: CRITICAL RESULT CALLED TO, READ BACK BY AND VERIFIED WITH:  Lorayne Rocks RN 08/30/2023 @1624  KKG (NOTE) The GeneXpert MRSA Assay (FDA approved for NASAL specimens only), is one component of a comprehensive MRSA colonization surveillance program. It is not intended to diagnose MRSA infection nor to guide or monitor treatment for MRSA infections. Test performance is not FDA approved in patients less than 29 years old. Performed at [facility], [address] [place], [place] [address]   Cath Tip Culture     Status: None   Collection Time: 08/31/23  8:50 AM   Specimen: Catheter Tip; Other  Result Value Ref Range Status   Specimen Description   Final    CATH TIP Performed at [facility], [address], [place], [place] [address]    Special Requests   Final    NONE Performed at [facility], [address], [place], [place] [address]    Culture   Final    NO GROWTH 2 DAYS Performed at [facility], [address], [place], [place] [address]    Report Status 09/02/2023 FINAL  Final  Culture, blood (single) w Reflex to ID Panel     Status: None (Preliminary result)   Collection Time: 08/31/23 10:12 AM   Specimen: BLOOD  Result Value Ref Range Status   Specimen Description  BLOOD RIGHT FA  Final   Special Requests   Final    BOTTLES DRAWN AEROBIC AND ANAEROBIC Blood Culture adequate volume   Culture   Final    NO GROWTH 3 DAYS Performed at [facility], [address] [place], [place] [address]    Report Status PENDING  Incomplete  Aerobic/Anaerobic Culture w Gram Stain (surgical/deep wound)     Status: None (Preliminary result)   Collection Time: 09/01/23 12:21 PM   Specimen: Path Tissue  Result Value Ref Range Status   Specimen Description   Final    TISSUE Performed at [facility], [address], [place], [place] [address]    Special Requests   Final    NONE Performed at [facility], [address] [place], [place] [address]    Gram Stain NO WBC SEEN NO ORGANISMS SEEN   Final   Culture   Final    FEW STAPHYLOCOCCUS AUREUS SUSCEPTIBILITIES TO FOLLOW Performed at [facility], [address], [place], [place] [address]    Report Status PENDING  Incomplete  Culture, blood (Routine X 2) w Reflex to ID Panel     Status: None (Preliminary result)   Collection Time: 09/02/23  8:08 AM   Specimen: BLOOD  Result Value Ref Range Status   Specimen Description BLOOD RIGHT ANTECUBITAL  Final   Special Requests   Final    BOTTLES DRAWN AEROBIC AND ANAEROBIC Blood Culture adequate volume   Culture   Final    NO GROWTH < 24 HOURS Performed at [facility], [address] [place], [place] [address]    Report Status PENDING  Incomplete  Culture, blood (Routine X 2) w Reflex to ID Panel     Status: None (Preliminary result)   Collection Time: 09/02/23  8:08 AM   Specimen: BLOOD  Result Value Ref Range Status   Specimen Description BLOOD BLOOD RIGHT FOREARM  Final   Special Requests   Final    BOTTLES DRAWN AEROBIC AND ANAEROBIC Blood Culture adequate volume   Culture   Final    NO GROWTH < 24 HOURS Performed at [facility], [address] [place], [place] [address]    Report Status  PENDING  Incomplete    Coagulation Studies: No results for input(s): LABPROT, INR in the last 72 hours.   Urinalysis: No results for input(s): COLORURINE, LABSPEC, PHURINE, GLUCOSEU, HGBUR, BILIRUBINUR, KETONESUR, PROTEINUR, UROBILINOGEN, NITRITE, LEUKOCYTESUR in the last 72 hours.  Invalid input(s): APPERANCEUR    Imaging: No results found.     Medications:    sodium chloride      anticoagulant sodium citrate      anticoagulant sodium citrate      metronidazole  500 mg (09/03/23 5638)   vancomycin  1,000 mg (09/03/23 1106)     sodium chloride    Intravenous Once   amiodarone   200 mg Oral BID   apixaban   2.5 mg Oral BID   aspirin  EC  81 mg Oral Daily   carvedilol   6.25 mg Oral Daily   Chlorhexidine  Gluconate Cloth  6 each Topical Q0600   epoetin  alfa-epbx (RETACRIT ) injection  10,000 Units Intravenous Q M,W,F-HD   iron  polysaccharides  150 mg Oral Daily   leptospermum manuka honey  1 Application Topical Daily   rosuvastatin   40 mg Oral Daily   senna  1 tablet Oral Daily   sevelamer  carbonate  2,400 mg Oral TID WC   sodium chloride  flush  3-10 mL Intravenous Q12H   traZODone   100 mg Oral QHS   acetaminophen , albuterol , alteplase , alteplase , alteplase , anticoagulant sodium citrate , anticoagulant sodium citrate , dextromethorphan-guaiFENesin , feeding supplement (NEPRO CARB STEADY), heparin , heparin , heparin , heparin , lidocaine  (PF), lidocaine -prilocaine , melatonin, midodrine , oxyCODONE -acetaminophen , pentafluoroprop-tetrafluoroeth, sodium chloride  flush, vancomycin   Assessment/ Plan:  Mr. Jovany Disano is a 63 y.o.  male with end stage renal disease on hemodialysis, diabetes, hypertension, CAD, Rt AKA, anemia, CHF who was admitted to [facility] on 08/29/2023 for revision of AVF. Patient had anaphylactic reaction and admitted to ICU.   [facility] [place]/MWF/left AVG removed  End-stage renal disease with hyperkalemia on hemodialysis.   - Jump graft  removed 09/01/2023.   - Patient seen and evaluated during dialysis treatment today on 09/03/2023.  We plan to complete treatment today and next treatment will be on Monday.  2. Anemia of chronic kidney disease Lab Results  Component Value Date   HGB 7.7 (L) 09/03/2023   Continue Epogen  10,000 IV with dialysis.  3. Secondary Hyperparathyroidism: with outpatient labs:     Lab Results  Component Value Date   CALCIUM  8.1 (L) 09/03/2023   CAION 1.06 (L) 08/05/2023   PHOS 5.0 (H) 09/03/2023  Phosphorus currently 5.0.  Maintain the patient on Renvela .  4. Pneumonia, sepsis and shortness of breath - Blood cultures (6/16) positive for MRSA and gram-positive rods  - iv Abx as per hospitalist team     LOS: 5 Renly Guedes 6/20/202512:21 PM

## 2023-09-03 NOTE — Plan of Care (Signed)

## 2023-09-03 NOTE — Progress Notes (Signed)
 OT Cancellation Note  Patient Details Name: Adam Howell MRN: [id number] DOB: 10-30-1960   Cancelled Treatment:    Reason Eval/Treat Not Completed: Patient declined, no reason specified. Pt declined OT treatment on this date, stated he was too fatigued from the events of the day to participate, despite verbal encouragement. Pt reports he will attempt tomorrow when feeling more rested. OT will re attempt on next available date/time.   Rosaria Common M.S. OTR/L  09/03/23, 2:50 PM

## 2023-09-03 NOTE — Transfer of Care (Signed)
 Immediate Anesthesia Transfer of Care Note  Patient: Adam Howell  Procedure(s) Performed: ECHOCARDIOGRAM, TRANSESOPHAGEAL  Patient Location: [facility]  Anesthesia Type:General  Level of Consciousness: awake and alert   Airway & Oxygen Therapy: Patient Spontanous Breathing and Patient connected to face mask oxygen  Post-op Assessment: Report given to RN and Post -op Vital signs reviewed and stable  Post vital signs: Reviewed and stable  Last Vitals:  Vitals Value Taken Time  BP 128/74 09/03/23 13:34  Temp    Pulse 82 09/03/23 13:34  Resp    SpO2 100 % 09/03/23 13:34    Last Pain:  Vitals:   09/03/23 1244  TempSrc: Oral  PainSc: 0-No pain         Complications: No notable events documented.

## 2023-09-03 NOTE — Progress Notes (Signed)
 PROGRESS NOTE Adam Howell  WUJ:[id number] DOB: [birth year] DOA: 08/29/2023 PCP: Adam Howell   Adam Howell is a 63 y.o. male with medical history significant of ESRD-HD (MWF), HTN, HLD, DM, CAD, dCHF, A fib on Eliquis , amenia, chronic pain, s/p of right AKA, who presents with SOB. Of note, had complicated admission from 5/22 - 5/29 including intubation and pressor use in ICU.   Had 2L O2 requirement on presentation. Treated for aspiration pneumonia. Now weaned to room air.  Also found to have MRSA bacteremia with source suspected to be recent temp cath or AVF site. TEE neg vegetation. Undergoing minimum of 6 weeks vancomycin  with HD. Ongoing management of vascular access modifications per vascular surgery. ID following for Abx guidance. Nephrology managing HD.     Assessment & Plan:   Principal Problem:   HCAP (healthcare-associated pneumonia) Active Problems:   Aspiration pneumonia (HCC)   CAD (coronary artery disease)   Myocardial injury   ESRD on dialysis [facility])   Atrial fibrillation, chronic (HCC)   Type 2 diabetes mellitus with ESRD (end-stage renal disease) (HCC)   Hyperlipidemia associated with type 2 diabetes mellitus (HCC)   Chronic diastolic CHF (congestive heart failure) (HCC)   Chronic pain syndrome   Pancytopenia (HCC)   Anemia in ESRD (end-stage renal disease) (HCC)   Overweight (BMI 25.0-29.9)   Encounter for dialysis catheter care [facility])  Assessment and Plan:  HAP  vs. aspiration pneumonia: Does not meet criteria for sepsis. Weaned to room air - MRSA swab positive - incentive spirometer and ambulate as much as tolerated  MRSA bacteremia: source possible HD catheter in R groin vs previous AVF site. S/p HD catheter removed by vasc surg on 08/31/23. Now has new temp cath on L groin. S/p graft excision 6/18. TEE neg.  - continue vanco as per ID. Repeat blood cultures drawn 6/19, 6/17 NGTD. ID following and recs appreciated  - added flagyl  for  clostridium coverage - f/u catheter tip culture, wound culture - minimum 6 week IV Abx with HD  ESRD: on HD MWF.  - Nephro following and recs  - vascular consulted. Temp cath removed 6/17 from R groin. Graft excision 6/18. Placed temp cath on L groin. Plan for permcath next week.  Hx of CAD & myocardial injury: trop  107, no chest pain.  Likely due to demand ischemia versus decreased clearance of troponin in the setting of ESRD. Continue on aspirin , statin   Left shin wound: present on admission. Wound care consulted   PAD  right AKA. Uses a wheelchair. Continue w/ supportive care - PT/OT- recommending SNF.  -  continue on statin    Chronic a. fib:  - continue on amio, coreg , hold for MAP and/or HR <65.  - Continue on eliquis     DM2: well-controlled glucose on labs. No need for insulin  currently    Chronic diastolic CHF: volume/fluid management w/ HD    Chronic pain syndrome: continue on home dose of percocet    Bicytopenia: w/ likely ACD secondary to ESRD. S/p 1 unit of pRBCs transfuse so far. Repeat H&H are trending up. Platelets are labile.   DVT prophylaxis: eliquis  Code Status: full  Family Communication: none at bedside Disposition Plan: SNF  Level of care: Telemetry Medical  Status is: Inpatient Remains inpatient appropriate because: severity of illness, requiring IV abxs  Consultants:  ID Nephro   Procedures:   Antimicrobials: vanco   Subjective: Pt reports feeling fine today. Denies pain.  Objective: Vitals:  09/02/23 0904 09/02/23 1607 09/02/23 2001 09/03/23 0337  BP: (!) 106/51 99/66 103/64 114/66  Pulse: (!) 58 60 98 (!) 56  Resp: 18 18 16 20   Temp: 98.2 F (36.8 C) 98.3 F (36.8 C) 98 F (36.7 C) 97.9 F (36.6 C)  TempSrc: Oral Oral Oral Oral  SpO2: 100% 90% 95% 97%  Weight:      Height:        Intake/Output Summary (Last 24 hours) at 09/03/2023 0725 Last data filed at 09/03/2023 0500 Gross per 24 hour  Intake 637.26 ml  Output --   Net 637.26 ml   Filed Weights   08/29/23 1958 08/31/23 0235 09/02/23 0500  Weight: 106.6 kg 97.7 kg 103.5 kg    Examination: General exam: appears lethargic  Respiratory system: diminished breath sounds b/l   Cardiovascular system: S1 & S2+. No rubs, gallops or clicks.  Gastrointestinal system: Abdomen is nondistended, soft and nontender. Normal bowel sounds heard. Central nervous system: lethargic  Psychiatry: Judgement and insight appears at baseline. Flat mood and affect  Data Reviewed: I have personally reviewed following labs and imaging studies  CBC: Recent Labs  Lab 08/29/23 1958 08/30/23 0405 08/30/23 1743 08/31/23 0347 09/01/23 0416  WBC 3.8* 4.3  --  5.2 4.5  NEUTROABS 2.4  --   --   --   --   HGB 7.4* 7.0* 8.8* 8.7* 8.0*  HCT 25.1* 23.7* 28.9* 28.5* 26.4*  MCV 94.7 94.4  --  91.9 90.7  PLT 95* 99*  --  88* 91*   Basic Metabolic Panel: Recent Labs  Lab 08/29/23 1958 08/30/23 0405 08/31/23 0347 09/01/23 0416 09/03/23 0417  NA 138 138 133* 133* 135  K 4.4 4.7 4.3 4.3 4.7  CL 98 94* 95* 94* 94*  CO2 25 31 25 27 28   GLUCOSE 97 94 87 100* 142*  BUN 41* 47* 32* 41* 60*  CREATININE 9.88* 10.35* 7.18* 9.14* 12.41*  CALCIUM  8.3* 8.1* 8.0* 8.2* 8.1*  PHOS  --   --   --   --  5.0*   GFR: Estimated Creatinine Clearance: 8 mL/min (A) (by C-G formula based on SCr of 12.41 mg/dL (H)). Liver Function Tests: Recent Labs  Lab 08/29/23 1958 09/03/23 0417  AST 22  --   ALT 13  --   ALKPHOS 69  --   BILITOT 0.9  --   PROT 6.7  --   ALBUMIN  2.7* 2.4*    Radiology Studies: ECHOCARDIOGRAM LIMITED Result Date: 09/01/2023    ECHOCARDIOGRAM LIMITED REPORT   Patient Name:   Adam Howell Date of Exam: 09/01/2023 Medical Rec #:  [id number]           Height:       75.0 in Accession #:    [id number]          Weight:       215.4 lb Date of Birth:  Jan 19, 1961           BSA:          2.265 m Patient Age:    63 years            BP:           112/59 mmHg Patient Gender:  M                   HR:           60 bpm. Exam Location:  [facility] Procedure: Limited Echo, Limited Color Doppler and Cardiac  Doppler (Both            Spectral and Color Flow Doppler were utilized during procedure). Indications:     Bacteremia  History:         Patient has prior history of Echocardiogram examinations, most                  recent 08/09/2023. CHF, CAD, end stage renal disease,                  Arrythmias:Atrial Fibrillation; Risk Factors:Dyslipidemia,                  Hypertension and Diabetes.  Sonographer:     Dione Franks RDCS Referring Phys:  [id number] Alphonsus Jeans Diagnosing Phys: Belva Boyden MD IMPRESSIONS  1. Left ventricular ejection fraction, by estimation, is 60 to 65%. The left ventricle has normal function. The left ventricle has no regional wall motion abnormalities. There is mild left ventricular hypertrophy.  2. Right ventricular systolic function is mildly reduced. The right ventricular size is moderately enlarged. There is severely elevated pulmonary artery systolic pressure. The estimated right ventricular systolic pressure is 64.0 mmHg.  3. The mitral valve is normal in structure. Mild mitral valve regurgitation. No evidence of mitral stenosis.  4. Tricuspid valve regurgitation is moderate.  5. The aortic valve is normal in structure. There is moderate calcification of the aortic valve. Aortic valve regurgitation is not visualized. Aortic valve sclerosis/calcification is present, without any evidence of aortic stenosis.  6. The inferior vena cava is dilated in size with <50% respiratory variability, suggesting right atrial pressure of 15 mmHg. FINDINGS  Left Ventricle: Left ventricular ejection fraction, by estimation, is 60 to 65%. The left ventricle has normal function. The left ventricle has no regional wall motion abnormalities. The left ventricular internal cavity size was normal in size. There is  mild left ventricular hypertrophy. Right Ventricle: The right  ventricular size is moderately enlarged. No increase in right ventricular wall thickness. Right ventricular systolic function is mildly reduced. There is severely elevated pulmonary artery systolic pressure. The tricuspid regurgitant velocity is 3.50 m/s, and with an assumed right atrial pressure of 15 mmHg, the estimated right ventricular systolic pressure is 64.0 mmHg. Left Atrium: Left atrial size was normal in size. Right Atrium: Right atrial size was normal in size. Pericardium: There is no evidence of pericardial effusion. Mitral Valve: The mitral valve is normal in structure. Mild mitral annular calcification. Mild mitral valve regurgitation. No evidence of mitral valve stenosis. There is no evidence of mitral valve vegetation. Tricuspid Valve: The tricuspid valve is normal in structure. Tricuspid valve regurgitation is moderate . No evidence of tricuspid stenosis. There is no evidence of tricuspid valve vegetation. Aortic Valve: The aortic valve is normal in structure. There is moderate calcification of the aortic valve. Aortic valve regurgitation is not visualized. Aortic valve sclerosis/calcification is present, without any evidence of aortic stenosis. There is no evidence of aortic valve vegetation. Pulmonic Valve: The pulmonic valve was normal in structure. Pulmonic valve regurgitation is not visualized. No evidence of pulmonic stenosis. There is no evidence of pulmonic valve vegetation. Aorta: The aortic root is normal in size and structure. Venous: The inferior vena cava is dilated in size with less than 50% respiratory variability, suggesting right atrial pressure of 15 mmHg. IAS/Shunts: No atrial level shunt detected by color flow Doppler. Additional Comments: Spectral Doppler performed. Color Doppler performed.  LEFT VENTRICLE PLAX 2D LVIDd:  4.20 cm LVIDs:         2.70 cm LV PW:         1.50 cm LV IVS:        1.30 cm  IVC IVC diam: 2.80 cm LEFT ATRIUM         Index LA diam:    4.50 cm 1.99  cm/m  AORTIC VALVE LVOT Vmax:   123.00 cm/s LVOT Vmean:  65.700 cm/s LVOT VTI:    0.217 m  AORTA Ao Asc diam: 3.40 cm MITRAL VALVE                TRICUSPID VALVE MV Area (PHT): 4.24 cm     TR Peak grad:   49.0 mmHg MV Decel Time: 179 msec     TR Vmax:        350.00 cm/s MV E velocity: 124.00 cm/s MV A velocity: 29.60 cm/s   SHUNTS MV E/A ratio:  4.19         Systemic VTI: 0.22 m Belva Boyden MD Electronically signed by Belva Boyden MD Signature Date/Time: 09/01/2023/2:40:38 PM    Final (Updated)      LOS: 5 days    Ree Candy, MD [facility]  If 7PM-7AM, please contact night-coverage [email or web address] 09/03/2023, 7:25 AM

## 2023-09-03 NOTE — Progress Notes (Signed)
 PT Cancellation Note  Patient Details Name: Adam Howell MRN: [id number] DOB: 1960-08-03   Cancelled Treatment:    Reason Eval/Treat Not Completed: Patient at procedure or test/unavailable Patient in dialysis. WIll re-attempt at later date/time.    [name] 09/03/2023, 10:50 AM

## 2023-09-03 NOTE — Anesthesia Postprocedure Evaluation (Signed)
 Anesthesia Post Note  Patient: Adam Howell  Procedure(s) Performed: ECHOCARDIOGRAM, TRANSESOPHAGEAL  Patient location during evaluation: PACU Anesthesia Type: General Level of consciousness: awake Pain management: satisfactory to patient Vital Signs Assessment: post-procedure vital signs reviewed and stable Respiratory status: spontaneous breathing Cardiovascular status: blood pressure returned to baseline Anesthetic complications: no   No notable events documented.   Last Vitals:  Vitals:   09/03/23 1345 09/03/23 1400  BP: (!) 126/48 120/73  Pulse: 81 74  Resp: (!) 22 (!) 23  Temp:    SpO2: 100% 91%    Last Pain:  Vitals:   09/03/23 1400  TempSrc:   PainSc: 0-No pain                 VAN STAVEREN,Mikhaela Zaugg

## 2023-09-03 NOTE — Progress Notes (Signed)
  Received patient in bed to unit.   Informed consent signed and in chart.    TX duration: 3.5hrs     Transported to specials Hand-off given to patient's nurse. No c/o and no acute distress noted    Access used: L Femoral Catheter Access issues: none Dressing changed    Total UF removed: 1.5L Medication(s) given: vancomycin , retacrit   Post HD VS: wnl  Post HD weight: 103.0kg     Bettye Bruins LPN [facility]

## 2023-09-03 NOTE — Anesthesia Procedure Notes (Signed)
 Date/Time: 09/03/2023 1:12 PM  Performed by: Angelia Kelp, CRNAPre-anesthesia Checklist: Patient identified, Emergency Drugs available, Suction available, Patient being monitored and Timeout performed Patient Re-evaluated:Patient Re-evaluated prior to induction Oxygen Delivery Method: Simple face mask Preoxygenation: Pre-oxygenation with 100% oxygen Induction Type: IV induction

## 2023-09-03 NOTE — Progress Notes (Signed)
 Patient stable. Bedside handoff given to [facility]

## 2023-09-04 DIAGNOSIS — J189 Pneumonia, unspecified organism: Secondary | ICD-10-CM | POA: Diagnosis not present

## 2023-09-04 DIAGNOSIS — R7881 Bacteremia: Secondary | ICD-10-CM | POA: Diagnosis not present

## 2023-09-04 DIAGNOSIS — B9562 Methicillin resistant Staphylococcus aureus infection as the cause of diseases classified elsewhere: Secondary | ICD-10-CM | POA: Diagnosis not present

## 2023-09-04 DIAGNOSIS — N186 End stage renal disease: Secondary | ICD-10-CM | POA: Diagnosis not present

## 2023-09-04 LAB — EXPECTORATED SPUTUM ASSESSMENT W GRAM STAIN, RFLX TO RESP C

## 2023-09-04 LAB — CBC
HCT: 27.5 % — ABNORMAL LOW (ref 39.0–52.0)
Hemoglobin: 8.3 g/dL — ABNORMAL LOW (ref 13.0–17.0)
MCH: 27.3 pg (ref 26.0–34.0)
MCHC: 30.2 g/dL (ref 30.0–36.0)
MCV: 90.5 fL (ref 80.0–100.0)
Platelets: 211 10*3/uL (ref 150–400)
RBC: 3.04 MIL/uL — ABNORMAL LOW (ref 4.22–5.81)
RDW: 17.9 % — ABNORMAL HIGH (ref 11.5–15.5)
WBC: 4.1 10*3/uL (ref 4.0–10.5)
nRBC: 1 % — ABNORMAL HIGH (ref 0.0–0.2)

## 2023-09-04 LAB — RENAL FUNCTION PANEL
Albumin: 2.6 g/dL — ABNORMAL LOW (ref 3.5–5.0)
Anion gap: 11 (ref 5–15)
BUN: 33 mg/dL — ABNORMAL HIGH (ref 8–23)
CO2: 26 mmol/L (ref 22–32)
Calcium: 8.3 mg/dL — ABNORMAL LOW (ref 8.9–10.3)
Chloride: 97 mmol/L — ABNORMAL LOW (ref 98–111)
Creatinine, Ser: 7.94 mg/dL — ABNORMAL HIGH (ref 0.61–1.24)
GFR, Estimated: 7 mL/min — ABNORMAL LOW (ref >60)
Glucose, Bld: 102 mg/dL — ABNORMAL HIGH (ref 70–99)
Phosphorus: 3.4 mg/dL (ref 2.5–4.6)
Potassium: 4.2 mmol/L (ref 3.5–5.1)
Sodium: 134 mmol/L — ABNORMAL LOW (ref 135–145)

## 2023-09-04 MED ORDER — BENZONATATE 100 MG PO CAPS
200.0000 mg | ORAL_CAPSULE | Freq: Three times a day (TID) | ORAL | Status: AC
  Administered 2023-09-04 – 2023-09-05 (×3): 200 mg via ORAL
  Filled 2023-09-04 (×3): qty 2

## 2023-09-04 MED ORDER — MENTHOL 3 MG MT LOZG
1.0000 | LOZENGE | OROMUCOSAL | Status: AC
  Administered 2023-09-04 – 2023-09-05 (×4): 3 mg via ORAL
  Filled 2023-09-04: qty 9

## 2023-09-04 NOTE — Plan of Care (Incomplete)
  Problem: Activity: Goal: Ability to tolerate increased activity will improve Outcome: Progressing   Problem: Clinical Measurements: Goal: Ability to maintain a body temperature in the normal range will improve Outcome: Progressing   Problem: Respiratory: Goal: Ability to maintain adequate ventilation will improve Outcome: Progressing Goal: Ability to maintain a clear airway will improve Outcome: Progressing   Problem: Education: Goal: Knowledge of General Education information will improve Description: Including pain rating scale, medication(s)/side effects and non-pharmacologic comfort measures Outcome: Progressing   Problem: Health Behavior/Discharge Planning: Goal: Ability to manage health-related needs will improve Outcome: Progressing   Problem: Activity: Goal: Risk for activity intolerance will decrease Outcome: Progressing   Problem: Clinical Measurements: Goal: Ability to maintain clinical measurements within normal limits will improve Outcome: Progressing Goal: Will remain free from infection Outcome: Progressing Goal: Diagnostic test results will improve Outcome: Progressing Goal: Respiratory complications will improve Outcome: Progressing Goal: Cardiovascular complication will be avoided Outcome: Progressing

## 2023-09-04 NOTE — Progress Notes (Signed)
 PROGRESS NOTE Adam Howell  FMW:[id number] DOB: [birth year] DOA: 08/29/2023 PCP: Adam Melanie DASEN, NP  Adam Howell is a 63 y.o. male with medical history significant of ESRD-HD (MWF), HTN, HLD, DM, CAD, dCHF, A fib on Eliquis , amenia, chronic pain, s/p of right AKA, who presents with SOB. Of note, had complicated admission from 5/22 - 5/29 including intubation and pressor use in ICU.   Had 2L O2 requirement on presentation. Treated for aspiration pneumonia. Now weaned to room air.  Also found to have MRSA bacteremia with source suspected to be recent temp cath or AVF site. TEE neg vegetation. Undergoing minimum of 6 weeks vancomycin  with HD. Ongoing management of vascular access modifications per vascular surgery. ID following for Abx guidance. Nephrology managing HD. Dc to SNF is pending more stable HD access.     Assessment & Plan:   Principal Problem:   Pneumonia of both lungs due to infectious organism Active Problems:   Aspiration pneumonia (HCC)   CAD (coronary artery disease)   Myocardial injury   ESRD on dialysis [facility])   Atrial fibrillation, chronic (HCC)   Type 2 diabetes mellitus with ESRD (end-stage renal disease) (HCC)   Hyperlipidemia associated with type 2 diabetes mellitus (HCC)   Chronic diastolic CHF (congestive heart failure) (HCC)   Chronic pain syndrome   Pancytopenia (HCC)   Anemia in ESRD (end-stage renal disease) (HCC)   Overweight (BMI 25.0-29.9)   Encounter for dialysis catheter care [facility])   Acute hypoxic respiratory failure (HCC)   Bacteremia   Endocarditis  Assessment and Plan:  HAP  vs. aspiration pneumonia: Does not meet criteria for sepsis. Weaned to room air - MRSA swab positive - incentive spirometer and ambulate as much as tolerated  MRSA bacteremia: source possible HD catheter in R groin vs previous AVF site. S/p HD catheter removed by vasc surg on 08/31/23. Now has new temp cath on L groin. S/p graft excision 6/18. TEE neg.  -  continue vanco as per ID. Repeat blood cultures drawn 6/19, 6/17 NGTD. ID following and recs appreciated  - added flagyl  for clostridium coverage - f/u catheter tip culture, wound culture - minimum 6 week IV Abx with HD  ESRD: on HD MWF.  - Nephro following and recs  - vascular consulted. Temp cath removed 6/17 from R groin. Graft excision 6/18. Placed temp cath on L groin. Plan for permcath next week.  Hx of CAD & myocardial injury: trop 107, no chest pain.  Likely due to demand ischemia versus decreased clearance of troponin in the setting of ESRD. Continue on aspirin , statin   Left shin wound: present on admission. Wound care consulted   PAD  right AKA. Uses a wheelchair. Continue w/ supportive care - PT/OT- recommending SNF.  -  continue on statin    Chronic a. fib:  - continue on amio, coreg , hold for MAP and/or HR <65.  - Continue on eliquis     DM2: well-controlled glucose on labs. No need for insulin  currently    Chronic diastolic CHF: volume/fluid management w/ HD    Chronic pain syndrome: continue on home dose of percocet    Bicytopenia: w/ likely ACD secondary to ESRD. S/p 1 unit of pRBCs transfuse so far. Repeat H&H are trending up. Platelets are labile.   DVT prophylaxis: eliquis  Code Status: full  Family Communication: none at bedside Disposition Plan: SNF  Level of care: Telemetry Medical  Status is: Inpatient Remains inpatient appropriate because: vascular access modifications prior to SNF  dc  Consultants:  ID Nephro  Vascular surgery   Procedures: HD, temp cath, excision of graft  Subjective: Pt reports feeling fine today. Denies pain in legs, groin, or arm. No SOB at rest. No questions.  Objective: Vitals:   09/03/23 1630 09/03/23 2014 09/04/23 0338 09/04/23 0400  BP: (!) 106/52 (!) 95/59 (!) 110/57   Pulse: 61 61 70   Resp: 18 16 16    Temp: 98 F (36.7 C) 97.8 F (36.6 C) 98 F (36.7 C)   TempSrc: Oral Oral Oral   SpO2: 96% 98% 95%    Weight:    104.1 kg  Height:        Intake/Output Summary (Last 24 hours) at 09/04/2023 0723 Last data filed at 09/03/2023 1333 Gross per 24 hour  Intake --  Output 1500 ml  Net -1500 ml   Filed Weights   09/03/23 1230 09/03/23 1244 09/04/23 0400  Weight: 103 kg 103 kg 104.1 kg    Examination: General exam: appears lethargic  Respiratory system: normal respiratory effort Cardiovascular system: S1 & S2+. No rubs, gallops or clicks.  Gastrointestinal system: Abdomen is nondistended, soft and nontender. Normal bowel sounds heard. Central nervous system: lethargic  Psychiatry: Judgement and insight appears at baseline. Flat mood and affect  Data Reviewed: I have personally reviewed following labs and imaging studies  CBC: Recent Labs  Lab 08/29/23 1958 08/30/23 0405 08/30/23 1743 08/31/23 0347 09/01/23 0416 09/03/23 0417 09/04/23 0437  WBC 3.8* 4.3  --  5.2 4.5 4.6 4.1  NEUTROABS 2.4  --   --   --   --   --   --   HGB 7.4* 7.0* 8.8* 8.7* 8.0* 7.7* 8.3*  HCT 25.1* 23.7* 28.9* 28.5* 26.4* 24.2* 27.5*  MCV 94.7 94.4  --  91.9 90.7 92.4 90.5  PLT 95* 99*  --  88* 91* 158 211   Basic Metabolic Panel: Recent Labs  Lab 08/30/23 0405 08/31/23 0347 09/01/23 0416 09/03/23 0417 09/04/23 0437  NA 138 133* 133* 135 134*  K 4.7 4.3 4.3 4.7 4.2  CL 94* 95* 94* 94* 97*  CO2 31 25 27 28 26   GLUCOSE 94 87 100* 142* 102*  BUN 47* 32* 41* 60* 33*  CREATININE 10.35* 7.18* 9.14* 12.41* 7.94*  CALCIUM  8.1* 8.0* 8.2* 8.1* 8.3*  PHOS  --   --   --  5.0* 3.4   GFR: Estimated Creatinine Clearance: 12.6 mL/min (A) (by C-G formula based on SCr of 7.94 mg/dL (H)). Liver Function Tests: Recent Labs  Lab 08/29/23 1958 09/03/23 0417 09/04/23 0437  AST 22  --   --   ALT 13  --   --   ALKPHOS 69  --   --   BILITOT 0.9  --   --   PROT 6.7  --   --   ALBUMIN  2.7* 2.4* 2.6*    LOS: 6 days    [name], MD [facility]  If 7PM-7AM, please contact  night-coverage [email or web address] 09/04/2023, 7:23 AM

## 2023-09-04 NOTE — Progress Notes (Signed)
 Occupational Therapy Treatment Patient Details Name: Adam Howell MRN: [id number] DOB: Nov 20, 1960 Today's Date: 09/04/2023   History of present illness Pt is a 63 y.o. male admitted with pneumonia, MRSA + staph infection, s/p HD cath removal on 6/17, infected L forearm IV access & acute hypoxic respiratory failure. Recent complicated hospital admission from 5/22-5/29 for elective AV fistula revision, possible IV contrast reaction requiring intubation and vasopressor support, discharged with plans for HD using R groin permacatheter. PMH significant for ESRD-HD (MWF), HTN, HLD, DM, CAD, dCHF, A fib on Eliquis , amenia, chronic pain, s/p of right AKA.   OT comments  Pt seen for OT/PT treatment on this date. Upon arrival to room pt semi supine in bed, agreeable to tx. Pt requires MINA for trunk support to come to sitting up on the EOB, verbal encouragement required to maximize patient efforts. Pt tolerated squatting at bed side to change the chuck, MINA for external support during squatting. Pt completed lateral scooting to either side, very small scoots. Pt declined sitting up in the chair on this date, despite verbal encouragement. Pt making good progress toward goals, will continue to follow POC. Discharge recommendation remains appropriate.        If plan is discharge home, recommend the following:  A lot of help with walking and/or transfers;A lot of help with bathing/dressing/bathroom;Assist for transportation;Assistance with cooking/housework   Equipment Recommendations  Other (comment) (Defer to next venue of care)    Recommendations for Other Services      Precautions / Restrictions Precautions Precautions: Fall Recall of Precautions/Restrictions: Intact Precaution/Restrictions Comments: R AKA, recent removal of R fem cath, LUE wound Restrictions Weight Bearing Restrictions Per Provider Order: No       Mobility Bed Mobility Overal bed mobility: Needs Assistance Bed  Mobility: Supine to Sit, Sit to Supine     Supine to sit: Min assist, Used rails, HOB elevated Sit to supine: Min assist   General bed mobility comments: Trunk support needed for pushing up off bed, requires encouragement to maximize indepence during bed mobility    Transfers Overall transfer level: Needs assistance Equipment used: Rolling walker (2 wheels) Transfers: Bed to chair/wheelchair/BSC            Lateral/Scoot Transfers: Contact guard assist General transfer comment: Pt tolerated squating to change chuck pad - MINA to assist in stability     Balance Overall balance assessment: Needs assistance Sitting-balance support: Feet supported, Bilateral upper extremity supported Sitting balance-Leahy Scale: Fair Sitting balance - Comments: limited reaching within BOS                                   ADL either performed or assessed with clinical judgement   ADL Overall ADL's : Needs assistance/impaired     Grooming: Wash/dry face;Bed level;Set up                                 General ADL Comments: Bed level grooming tasks; set up assistance    Extremity/Trunk Assessment              Vision       Perception     Praxis     Communication Communication Communication: Impaired Factors Affecting Communication: Difficulty expressing self   Cognition Arousal: Alert Behavior During Therapy: Flat affect Cognition: No family/caregiver present to determine baseline, Difficult to assess  Following commands: Intact Following commands impaired: Follows one step commands inconsistently      Cueing   Cueing Techniques: Verbal cues  Exercises Exercises: Other exercises Other Exercises Other Exercises: Pt completed LE exercises lead by PT, completed UE exercises; shoulder UE flexion and elbow extension at bed level x10 lead by OT    Shoulder Instructions       General Comments RN in  room to assess bleeding at femoral cath site    Pertinent Vitals/ Pain       Pain Assessment Pain Assessment: No/denies pain  Home Living Family/patient expects to be discharged to:: Skilled nursing facility                                 Additional Comments: Pt states he lives with daughter but per chart review states he comes from SNF, need to further clarify with daughter.      Prior Functioning/Environment              Frequency  Min 2X/week        Progress Toward Goals  OT Goals(current goals can now be found in the care plan section)  Progress towards OT goals: Progressing toward goals  Acute Rehab OT Goals OT Goal Formulation: With patient Time For Goal Achievement: 09/15/23 Potential to Achieve Goals: Good ADL Goals Pt Will Perform Grooming: sitting;with contact guard assist Pt Will Perform Upper Body Dressing: with supervision;sitting Pt Will Perform Lower Body Dressing: with supervision;sitting/lateral leans;sit to/from stand Pt Will Transfer to Toilet: bedside commode;squat pivot transfer;with min assist Pt Will Perform Toileting - Clothing Manipulation and hygiene: with min assist;sitting/lateral leans  Plan      Co-evaluation    PT/OT/SLP Co-Evaluation/Treatment: Yes Reason for Co-Treatment: Necessary to address cognition/behavior during functional activity;To address functional/ADL transfers PT goals addressed during session: Mobility/safety with mobility;Balance OT goals addressed during session: ADL's and self-care      AM-PAC OT 6 Clicks Daily Activity     Outcome Measure   Help from another person eating meals?: None Help from another person taking care of personal grooming?: None Help from another person toileting, which includes using toliet, bedpan, or urinal?: A Lot Help from another person bathing (including washing, rinsing, drying)?: A Lot Help from another person to put on and taking off regular upper body  clothing?: A Little Help from another person to put on and taking off regular lower body clothing?: A Lot 6 Click Score: 17    End of Session Equipment Utilized During Treatment: Rolling walker (2 wheels)  OT Visit Diagnosis: Other abnormalities of gait and mobility (R26.89);Muscle weakness (generalized) (M62.81)   Activity Tolerance Patient tolerated treatment well   Patient Left in bed;with call bell/phone within reach;with bed alarm set   Nurse Communication Other (comment) (Femoral cath bleeding)        Time: 8946-8883 OT Time Calculation (min): 23 min  Charges: OT General Charges $OT Visit: 1 Visit OT Treatments $Self Care/Home Management : 8-22 mins  Larraine Colas M.S. OTR/L  09/04/23, 1:29 PM

## 2023-09-04 NOTE — Progress Notes (Signed)
 Physical Therapy Treatment Patient Details Name: Adam Howell MRN: [id number] DOB: April 29, 1960 Today's Date: 09/04/2023   History of Present Illness Pt is a 63 y.o. male admitted with pneumonia, MRSA + staph infection, s/p HD cath removal on 6/17, infected L forearm IV access & acute hypoxic respiratory failure. Recent complicated hospital admission from 5/22-5/29 for elective AV fistula revision, possible IV contrast reaction requiring intubation and vasopressor support, discharged with plans for HD using R groin permacatheter. PMH significant for ESRD-HD (MWF), HTN, HLD, DM, CAD, dCHF, A fib on Eliquis , amenia, chronic pain, s/p of right AKA.    PT Comments  Patient alert agreeable to PT/OT encouragement (OT/PT co treat to maximize pt participation, fatigue). Session interrupted by RN due to noting soiled (with blood) HD catheter, and RN in room to change. Pt able to participate in UE/LE exercises. minA to come up to sitting with time to allow pt to maximize participation (encouragement to attempt without assistance needed). He was able to squat enough at EOB to lift buttocks for linen change, and then laterally scoot very little to L and to R, returned to supine minA. The patient would benefit from further skilled PT intervention to continue to progress towards goals.   If plan is discharge home, recommend the following: Assistance with feeding;Direct supervision/assist for medications management;Assistance with cooking/housework;Direct supervision/assist for financial management;A little help with walking and/or transfers;A little help with bathing/dressing/bathroom;Assist for transportation   Can travel by private vehicle     No  Equipment Recommendations  Other (comment)    Recommendations for Other Services       Precautions / Restrictions Precautions Precautions: Fall Recall of Precautions/Restrictions: Intact Precaution/Restrictions Comments: R AKA, recent removal of R fem  cath, LUE wound Restrictions Weight Bearing Restrictions Per Provider Order: No     Mobility  Bed Mobility Overal bed mobility: Needs Assistance Bed Mobility: Supine to Sit, Sit to Supine     Supine to sit: Min assist, Used rails, HOB elevated Sit to supine: Min assist   General bed mobility comments: pt needed encouragement to maximize his participation    Transfers Overall transfer level: Needs assistance   Transfers: Bed to chair/wheelchair/BSC            Lateral/Scoot Transfers: Contact guard assist General transfer comment: able to squat at EOB to change linen, able to clear buttocks for linen change    Ambulation/Gait               General Gait Details: pt non-amb at baseline   Stairs             Wheelchair Mobility     Tilt Bed    Modified Rankin (Stroke Patients Only)       Balance Overall balance assessment: Needs assistance Sitting-balance support: Feet supported, Bilateral upper extremity supported Sitting balance-Leahy Scale: Fair Sitting balance - Comments: progressed to sitting fair balance       Standing balance comment: NT                            Communication    Cognition Arousal: Alert Behavior During Therapy: Flat affect                           PT - Cognition Comments: pt oriented to self, place, self limiting Following commands: Intact      Cueing    Exercises Other Exercises Other Exercises:  heel slides, hip abuction on LLE x10, able to lift R residual limb against gravity. educated on prothetic wear (potential need for prosthetist appt), WC management, cushioning, etc    General Comments        Pertinent Vitals/Pain Pain Assessment Pain Assessment: Faces Faces Pain Scale: No hurt    Home Living                          Prior Function            PT Goals (current goals can now be found in the care plan section) Progress towards PT goals: Progressing toward  goals    Frequency    Min 2X/week      PT Plan      Co-evaluation PT/OT/SLP Co-Evaluation/Treatment: Yes Reason for Co-Treatment: Necessary to address cognition/behavior during functional activity;To address functional/ADL transfers PT goals addressed during session: Mobility/safety with mobility;Balance OT goals addressed during session: ADL's and self-care      AM-PAC PT 6 Clicks Mobility   Outcome Measure  Help needed turning from your back to your side while in a flat bed without using bedrails?: A Lot Help needed moving from lying on your back to sitting on the side of a flat bed without using bedrails?: A Lot Help needed moving to and from a bed to a chair (including a wheelchair)?: A Lot Help needed standing up from a chair using your arms (e.g., wheelchair or bedside chair)?: A Lot Help needed to walk in hospital room?: Total Help needed climbing 3-5 steps with a railing? : Total 6 Click Score: 10    End of Session   Activity Tolerance: Patient limited by fatigue Patient left: in bed;with call bell/phone within reach;with bed alarm set Nurse Communication: Mobility status PT Visit Diagnosis: Muscle weakness (generalized) (M62.81);Other abnormalities of gait and mobility (R26.89)     Time: 8946-8873 PT Time Calculation (min) (ACUTE ONLY): 33 min  Charges:    $Therapeutic Activity: 8-22 mins PT General Charges $$ ACUTE PT VISIT: 1 Visit                     Doyal Shams PT, DPT 12:53 PM,09/04/23

## 2023-09-04 NOTE — Progress Notes (Signed)
 [facility]  ROUNDING NOTE   Subjective:    Adam Howell is a 63 y.o male with past medical history of diabetes, hypertension, CAD, Rt AKA, anemia, CHF, and ESRD on dialysis. Patient presents to vascular surgery for revision of AVF. He is being admitted for Aspiration pneumonia (HCC) [J69.0] HCAP (healthcare-associated pneumonia) [J18.9] Pneumonia of both lungs due to infectious organism, unspecified part of lung [J18.9] Acute hypoxic respiratory failure (HCC) [J96.01]   Patient presented to the hospital for shortness of breath.  He was diagnosed with pneumonia and is being admitted for further management.  Blood cultures are positive for staph species and MRSA in addition to gram-positive rods.  Femoral tunneled dialysis catheter was removed 6-17/25.   Update: No acute events overnight. Patient on room air with intermittent cough. Denies cramping with dialysis yesterday.    Objective:  Vital signs in last 24 hours:  Temp:  [97.7 F (36.5 C)-98.7 F (37.1 C)] 98.7 F (37.1 C) (06/21 0827) Pulse Rate:  [61-97] 68 (06/21 0827) Resp:  [14-37] 20 (06/21 0827) BP: (85-133)/(48-74) 108/53 (06/21 0827) SpO2:  [91 %-100 %] 100 % (06/21 0827) Weight:  [103 kg-104.1 kg] 104.1 kg (06/21 0400)  Weight change:  Filed Weights   09/03/23 1230 09/03/23 1244 09/04/23 0400  Weight: 103 kg 103 kg 104.1 kg    Intake/Output: I/O last 3 completed shifts: In: 397.3 [P.O.:240; IV Piggyback:157.3] Out: 1500 [Other:1500]   Intake/Output this shift:  Total I/O In: 120 [P.O.:120] Out: -   Physical Exam: General: NAD,   Head: Normocephalic, atraumatic. Moist oral mucosal membranes  Eyes: Anicteric, PERRL  Neck: Supple, trachea midline  Lungs:  Scattered rhonchi to auscultation, on room air  Heart: Regular rate and rhythm  Abdomen:  Soft, nontender,   Extremities:  No peripheral edema.  Neurologic: Nonfocal, moving all four extremities  Skin: No lesions  Access: Left AVF     Basic Metabolic Panel: Recent Labs  Lab 08/30/23 0405 08/31/23 0347 09/01/23 0416 09/03/23 0417 09/04/23 0437  NA 138 133* 133* 135 134*  K 4.7 4.3 4.3 4.7 4.2  CL 94* 95* 94* 94* 97*  CO2 31 25 27 28 26   GLUCOSE 94 87 100* 142* 102*  BUN 47* 32* 41* 60* 33*  CREATININE 10.35* 7.18* 9.14* 12.41* 7.94*  CALCIUM  8.1* 8.0* 8.2* 8.1* 8.3*  PHOS  --   --   --  5.0* 3.4    Liver Function Tests: Recent Labs  Lab 08/29/23 1958 09/03/23 0417 09/04/23 0437  AST 22  --   --   ALT 13  --   --   ALKPHOS 69  --   --   BILITOT 0.9  --   --   PROT 6.7  --   --   ALBUMIN  2.7* 2.4* 2.6*   No results for input(s): LIPASE, AMYLASE in the last 168 hours. No results for input(s): AMMONIA in the last 168 hours.  CBC: Recent Labs  Lab 08/29/23 1958 08/30/23 0405 08/30/23 1743 08/31/23 0347 09/01/23 0416 09/03/23 0417 09/04/23 0437  WBC 3.8* 4.3  --  5.2 4.5 4.6 4.1  NEUTROABS 2.4  --   --   --   --   --   --   HGB 7.4* 7.0* 8.8* 8.7* 8.0* 7.7* 8.3*  HCT 25.1* 23.7* 28.9* 28.5* 26.4* 24.2* 27.5*  MCV 94.7 94.4  --  91.9 90.7 92.4 90.5  PLT 95* 99*  --  88* 91* 158 211    Cardiac  Enzymes: No results for input(s): CKTOTAL, CKMB, CKMBINDEX, TROPONINI in the last 168 hours.  BNP: Invalid input(s): POCBNP  CBG: Recent Labs  Lab 09/01/23 1326  GLUCAP 96    Microbiology: Results for orders placed or performed during the hospital encounter of 08/29/23  Resp panel by RT-PCR (RSV, Flu A&B, Covid) Anterior Nasal Swab     Status: None   Collection Time: 08/29/23  8:00 PM   Specimen: Anterior Nasal Swab  Result Value Ref Range Status   SARS Coronavirus 2 by RT PCR NEGATIVE NEGATIVE Final    Comment: (NOTE) SARS-CoV-2 target nucleic acids are NOT DETECTED.  The SARS-CoV-2 RNA is generally detectable in upper respiratory specimens during the acute phase of infection. The lowest concentration of SARS-CoV-2 viral copies this assay can detect is 138  copies/mL. A negative result does not preclude SARS-Cov-2 infection and should not be used as the sole basis for treatment or other patient management decisions. A negative result may occur with  improper specimen collection/handling, submission of specimen other than nasopharyngeal swab, presence of viral mutation(s) within the areas targeted by this assay, and inadequate number of viral copies(<138 copies/mL). A negative result must be combined with clinical observations, patient history, and epidemiological information. The expected result is Negative.  Fact Sheet for Patients:  BloggerCourse.com  Fact Sheet for Healthcare Providers:  SeriousBroker.it  This test is no t yet approved or cleared by the [place]  FDA and  has been authorized for detection and/or diagnosis of SARS-CoV-2 by FDA under an Emergency Use Authorization (EUA). This EUA will remain  in effect (meaning this test can be used) for the duration of the COVID-19 declaration under Section 564(b)(1) of the Act, 21 U.S.C.section 360bbb-3(b)(1), unless the authorization is terminated  or revoked sooner.       Influenza A by PCR NEGATIVE NEGATIVE Final   Influenza B by PCR NEGATIVE NEGATIVE Final    Comment: (NOTE) The Xpert Xpress SARS-CoV-2/FLU/RSV plus assay is intended as an aid in the diagnosis of influenza from Nasopharyngeal swab specimens and should not be used as a sole basis for treatment. Nasal washings and aspirates are unacceptable for Xpert Xpress SARS-CoV-2/FLU/RSV testing.  Fact Sheet for Patients: BloggerCourse.com  Fact Sheet for Healthcare Providers: SeriousBroker.it  This test is not yet approved or cleared by the [place]  FDA and has been authorized for detection and/or diagnosis of SARS-CoV-2 by FDA under an Emergency Use Authorization (EUA). This EUA will remain in effect (meaning  this test can be used) for the duration of the COVID-19 declaration under Section 564(b)(1) of the Act, 21 U.S.C. section 360bbb-3(b)(1), unless the authorization is terminated or revoked.     Resp Syncytial Virus by PCR NEGATIVE NEGATIVE Final    Comment: (NOTE) Fact Sheet for Patients: BloggerCourse.com  Fact Sheet for Healthcare Providers: SeriousBroker.it  This test is not yet approved or cleared by the [place]  FDA and has been authorized for detection and/or diagnosis of SARS-CoV-2 by FDA under an Emergency Use Authorization (EUA). This EUA will remain in effect (meaning this test can be used) for the duration of the COVID-19 declaration under Section 564(b)(1) of the Act, 21 U.S.C. section 360bbb-3(b)(1), unless the authorization is terminated or revoked.  Performed at [facility], [address] [place], [place] [address]   Blood Culture (routine x 2)     Status: Abnormal   Collection Time: 08/30/23 12:06 AM   Specimen: BLOOD RIGHT HAND  Result Value Ref Range Status   Specimen  Description   Final    BLOOD RIGHT HAND Performed at [facility], [address] [place], [place] [address]    Special Requests   Final    BOTTLES DRAWN AEROBIC AND ANAEROBIC Blood Culture results may not be optimal due to an inadequate volume of blood received in culture bottles Performed at [facility], [address], [place], [place] [address]    Culture  Setup Time   Final    GRAM POSITIVE COCCI IN BOTH AEROBIC AND ANAEROBIC BOTTLES CRITICAL RESULT CALLED TO, READ BACK BY AND VERIFIED WITH: MOSE BLEW 08/30/23 1230 MW Performed at [facility], [address] [place], [place] [address]    Culture (A)  Final    STAPHYLOCOCCUS AUREUS SUSCEPTIBILITIES PERFORMED ON PREVIOUS CULTURE WITHIN THE LAST 5 DAYS. Performed at [facility], [address], [place], [place] [address]     Report Status 09/01/2023 FINAL  Final  Blood Culture (routine x 2)     Status: Abnormal   Collection Time: 08/30/23 12:06 AM   Specimen: BLOOD RIGHT ARM  Result Value Ref Range Status   Specimen Description   Final    BLOOD RIGHT ARM Performed at [facility], [address] [place], [place] [address]    Special Requests   Final    BOTTLES DRAWN AEROBIC AND ANAEROBIC Blood Culture results may not be optimal due to an inadequate volume of blood received in culture bottles Performed at [facility], [address] [place], [place] [address]    Culture  Setup Time   Final    GRAM POSITIVE COCCI IN BOTH AEROBIC AND ANAEROBIC BOTTLES CRITICAL RESULT CALLED TO, READ BACK BY AND VERIFIED WITH: TREY GREENWOOD 08/30/23 1230 MW GRAM POSITIVE RODS ANAEROBIC BOTTLE ONLY CRITICAL RESULT CALLED TO, READ BACK BY AND VERIFIED WITH: PHARMD W. ANDERSON [id number] @1700  FH Performed at [facility], [address] [place], [place] [address]    Culture (A)  Final    METHICILLIN RESISTANT STAPHYLOCOCCUS AUREUS CLOSTRIDIUM PERFRINGENS    Report Status 09/01/2023 FINAL  Final   Organism ID, Bacteria METHICILLIN RESISTANT STAPHYLOCOCCUS AUREUS  Final      Susceptibility   Methicillin resistant staphylococcus aureus - MIC*    CIPROFLOXACIN >=8 RESISTANT Resistant     ERYTHROMYCIN >=8 RESISTANT Resistant     GENTAMICIN <=0.5 SENSITIVE Sensitive     OXACILLIN >=4 RESISTANT Resistant     TETRACYCLINE >=16 RESISTANT Resistant     VANCOMYCIN  1 SENSITIVE Sensitive     TRIMETH /SULFA  <=10 SENSITIVE Sensitive     CLINDAMYCIN >=8 RESISTANT Resistant     RIFAMPIN <=0.5 SENSITIVE Sensitive     Inducible Clindamycin NEGATIVE Sensitive     LINEZOLID  2 SENSITIVE Sensitive     * METHICILLIN RESISTANT STAPHYLOCOCCUS AUREUS  Blood Culture ID Panel (Reflexed)     Status: Abnormal   Collection Time: 08/30/23 12:06 AM  Result Value Ref Range Status   Enterococcus faecalis NOT DETECTED NOT  DETECTED Final   Enterococcus Faecium NOT DETECTED NOT DETECTED Final   Listeria monocytogenes NOT DETECTED NOT DETECTED Final   Staphylococcus species DETECTED (A) NOT DETECTED Final    Comment: CRITICAL RESULT CALLED TO, READ BACK BY AND VERIFIED WITH: TREY GREENWOOD 08/30/23 1230 MW    Staphylococcus aureus (BCID) DETECTED (A) NOT DETECTED Final    Comment: Methicillin (oxacillin)-resistant Staphylococcus aureus (MRSA). MRSA is predictably resistant to beta-lactam antibiotics (except ceftaroline). Preferred therapy is vancomycin  unless clinically contraindicated. Patient  requires contact precautions if  hospitalized. CRITICAL RESULT CALLED TO, READ BACK BY AND VERIFIED WITH: TREY GREENWOOD 08/30/23 1230 MW    Staphylococcus epidermidis NOT DETECTED NOT DETECTED Final   Staphylococcus lugdunensis NOT DETECTED NOT DETECTED Final   Streptococcus species NOT DETECTED NOT DETECTED Final   Streptococcus agalactiae NOT DETECTED NOT DETECTED Final   Streptococcus pneumoniae NOT DETECTED NOT DETECTED Final   Streptococcus pyogenes NOT DETECTED NOT DETECTED Final   A.calcoaceticus-baumannii NOT DETECTED NOT DETECTED Final   Bacteroides fragilis NOT DETECTED NOT DETECTED Final   Enterobacterales NOT DETECTED NOT DETECTED Final   Enterobacter cloacae complex NOT DETECTED NOT DETECTED Final   Escherichia coli NOT DETECTED NOT DETECTED Final   Klebsiella aerogenes NOT DETECTED NOT DETECTED Final   Klebsiella oxytoca NOT DETECTED NOT DETECTED Final   Klebsiella pneumoniae NOT DETECTED NOT DETECTED Final   Proteus species NOT DETECTED NOT DETECTED Final   Salmonella species NOT DETECTED NOT DETECTED Final   Serratia marcescens NOT DETECTED NOT DETECTED Final   Haemophilus influenzae NOT DETECTED NOT DETECTED Final   Neisseria meningitidis NOT DETECTED NOT DETECTED Final   Pseudomonas aeruginosa NOT DETECTED NOT DETECTED Final   Stenotrophomonas maltophilia NOT DETECTED NOT DETECTED Final   Candida  albicans NOT DETECTED NOT DETECTED Final   Candida auris NOT DETECTED NOT DETECTED Final   Candida glabrata NOT DETECTED NOT DETECTED Final   Candida krusei NOT DETECTED NOT DETECTED Final   Candida parapsilosis NOT DETECTED NOT DETECTED Final   Candida tropicalis NOT DETECTED NOT DETECTED Final   Cryptococcus neoformans/gattii NOT DETECTED NOT DETECTED Final   Meth resistant mecA/C and MREJ DETECTED (A) NOT DETECTED Final    Comment: CRITICAL RESULT CALLED TO, READ BACK BY AND VERIFIED WITH: MOSE BLEW 08/30/23 1230 MW Performed at [facility], [address] [place], [place] [address]   MRSA Next Gen by PCR, Nasal     Status: Abnormal   Collection Time: 08/30/23  2:53 PM   Specimen: Nasal Mucosa; Nasal Swab  Result Value Ref Range Status   MRSA by PCR Next Gen DETECTED (A) NOT DETECTED Final    Comment: CRITICAL RESULT CALLED TO, READ BACK BY AND VERIFIED WITH:  ZADA GARRISON RN 08/30/2023 @1624  KKG (NOTE) The GeneXpert MRSA Assay (FDA approved for NASAL specimens only), is one component of a comprehensive MRSA colonization surveillance program. It is not intended to diagnose MRSA infection nor to guide or monitor treatment for MRSA infections. Test performance is not FDA approved in patients less than 64 years old. Performed at [facility], [address] [place], [place] [address]   Cath Tip Culture     Status: None   Collection Time: 08/31/23  8:50 AM   Specimen: Catheter Tip; Other  Result Value Ref Range Status   Specimen Description   Final    CATH TIP Performed at [facility], [address], [place], [place] [address]    Special Requests   Final    NONE Performed at [facility], [address], [place], [place] [address]    Culture   Final    NO GROWTH 2 DAYS Performed at [facility], [address], [place], [place] [address]    Report Status 09/02/2023 FINAL  Final  Culture, blood (single) w Reflex to ID  Panel     Status: None (Preliminary result)   Collection Time: 08/31/23 10:12 AM   Specimen: BLOOD  Result Value Ref Range Status  Specimen Description BLOOD RIGHT FA  Final   Special Requests   Final    BOTTLES DRAWN AEROBIC AND ANAEROBIC Blood Culture adequate volume   Culture   Final    NO GROWTH 4 DAYS Performed at [facility], [address] [place], [place] [address]    Report Status PENDING  Incomplete  Aerobic/Anaerobic Culture w Gram Stain (surgical/deep wound)     Status: None (Preliminary result)   Collection Time: 09/01/23 12:21 PM   Specimen: Path Tissue  Result Value Ref Range Status   Specimen Description   Final    TISSUE Performed at [facility], [address], [place], [place] [address]    Special Requests   Final    NONE Performed at [facility], [address], [place], [place] [address]    Gram Stain   Final    NO WBC SEEN NO ORGANISMS SEEN Performed at [facility], [address] [place], [place] [address]    Culture   Final    FEW METHICILLIN RESISTANT STAPHYLOCOCCUS AUREUS NO ANAEROBES ISOLATED; CULTURE IN PROGRESS FOR 5 DAYS    Report Status PENDING  Incomplete   Organism ID, Bacteria METHICILLIN RESISTANT STAPHYLOCOCCUS AUREUS  Final      Susceptibility   Methicillin resistant staphylococcus aureus - MIC*    CIPROFLOXACIN >=8 RESISTANT Resistant     ERYTHROMYCIN >=8 RESISTANT Resistant     GENTAMICIN <=0.5 SENSITIVE Sensitive     OXACILLIN >=4 RESISTANT Resistant     TETRACYCLINE >=16 RESISTANT Resistant     VANCOMYCIN  1 SENSITIVE Sensitive     TRIMETH /SULFA  <=10 SENSITIVE Sensitive     CLINDAMYCIN >=8 RESISTANT Resistant     RIFAMPIN <=0.5 SENSITIVE Sensitive     Inducible Clindamycin NEGATIVE Sensitive     LINEZOLID  2 SENSITIVE Sensitive     * FEW METHICILLIN RESISTANT STAPHYLOCOCCUS AUREUS  Culture, blood (Routine X 2) w Reflex to ID Panel     Status: None (Preliminary result)   Collection Time:  09/02/23  8:08 AM   Specimen: BLOOD  Result Value Ref Range Status   Specimen Description BLOOD RIGHT ANTECUBITAL  Final   Special Requests   Final    BOTTLES DRAWN AEROBIC AND ANAEROBIC Blood Culture adequate volume   Culture   Final    NO GROWTH 2 DAYS Performed at [facility], [address] [place], [place] [address]    Report Status PENDING  Incomplete  Culture, blood (Routine X 2) w Reflex to ID Panel     Status: None (Preliminary result)   Collection Time: 09/02/23  8:08 AM   Specimen: BLOOD  Result Value Ref Range Status   Specimen Description BLOOD BLOOD RIGHT FOREARM  Final   Special Requests   Final    BOTTLES DRAWN AEROBIC AND ANAEROBIC Blood Culture adequate volume   Culture   Final    NO GROWTH 2 DAYS Performed at [facility], [address] [place], [place] [address]    Report Status PENDING  Incomplete  Expectorated Sputum Assessment w Gram Stain, Rflx to Resp Cult     Status: None   Collection Time: 09/04/23  3:13 AM   Specimen: Expectorated Sputum  Result Value Ref Range Status   Specimen Description EXPECTORATED SPUTUM  Final   Special Requests NONE  Final   Sputum evaluation   Final    Sputum specimen not acceptable for testing.  Please recollect.   RESULT CALLED TO, READ BACK BY  AND VERIFIED WITH: ROXANNE ROOT @ 09/04/23 0448 AB Performed at [facility], [address] [place], [place] [address]    Report Status 09/04/2023 FINAL  Final    Coagulation Studies: No results for input(s): LABPROT, INR in the last 72 hours.  Urinalysis: No results for input(s): COLORURINE, LABSPEC, PHURINE, GLUCOSEU, HGBUR, BILIRUBINUR, KETONESUR, PROTEINUR, UROBILINOGEN, NITRITE, LEUKOCYTESUR in the last 72 hours.  Invalid input(s): APPERANCEUR    Imaging: ECHO TEE Result Date: 09/03/2023    TRANSESOPHOGEAL ECHO REPORT   Patient Name:   Adam Howell Date of Exam: 09/03/2023 Medical Rec #:  [id number]            Height:       75.0 in Accession #:    [id number]          Weight:       227.1 lb Date of Birth:  1960/09/17           BSA:          2.316 m Patient Age:    63 years            BP:           111/63 mmHg Patient Gender: M                   HR:           65 bpm. Exam Location:  [facility] Procedure: Transesophageal Echo, Cardiac Doppler, Color Doppler and Saline            Contrast Bubble Study (Both Spectral and Color Flow Doppler were            utilized during procedure). Indications:     Bacteremia  History:         Patient has prior history of Echocardiogram examinations, most                  recent 09/01/2023. CHF; Risk Factors:Hypertension and Diabetes.                  Chronic kidney disease, dialysis pt.  Sonographer:     Christopher Furnace Referring Phys:  [id number] SHERI HAMMOCK Diagnosing Phys: Evalene Lunger MD PROCEDURE: After discussion of the risks and benefits of a TEE, an informed consent was obtained from the patient. TEE procedure time was 30 minutes. The transesophogeal probe was passed without difficulty through the esophogus of the patient. Local oropharyngeal anesthetic was provided with Cetacaine  and viscous lidocaine . Sedation performed by different physician. Image quality was excellent. The patient's vital signs; including heart rate, blood pressure, and oxygen saturation; remained stable throughout the procedure. The patient developed no complications during the procedure.  IMPRESSIONS  1. No valve vegetation noted  2. Left ventricular ejection fraction, by estimation, is 55 to 60%. The left ventricle has normal function. The left ventricle has no regional wall motion abnormalities.  3. Right ventricular systolic function is moderately reduced. The right ventricular size is mildly enlarged. There is severely elevated pulmonary artery systolic pressure. The estimated right ventricular systolic pressure is 68.4 mmHg.  4. Left atrial size was moderately dilated. No left atrial/left atrial appendage  thrombus was detected.  5. Right atrial size was moderately dilated.  6. The mitral valve is normal in structure. No evidence of mitral valve regurgitation. No evidence of mitral stenosis.  7. Tricuspid valve regurgitation is moderate.  8. The aortic valve is tricuspid. Aortic valve regurgitation is not visualized. No aortic stenosis is present.  9.  There is Moderate (Grade III) atheroma plaque involving the aortic arch and descending aorta. 10. The inferior vena cava is normal in size with greater than 50% respiratory variability, suggesting right atrial pressure of 3 mmHg. 11. Agitated saline contrast bubble study was negative, with no evidence of any interatrial shunt. Conclusion(s)/Recommendation(s): Normal biventricular function without evidence of hemodynamically significant valvular heart disease. FINDINGS  Left Ventricle: Left ventricular ejection fraction, by estimation, is 55 to 60%. The left ventricle has normal function. The left ventricle has no regional wall motion abnormalities. The left ventricular internal cavity size was normal in size. There is  no left ventricular hypertrophy. Right Ventricle: The right ventricular size is mildly enlarged. No increase in right ventricular wall thickness. Right ventricular systolic function is moderately reduced. There is severely elevated pulmonary artery systolic pressure. The tricuspid regurgitant velocity is 3.82 m/s, and with an assumed right atrial pressure of 10 mmHg, the estimated right ventricular systolic pressure is 68.4 mmHg. Left Atrium: Left atrial size was moderately dilated. No left atrial/left atrial appendage thrombus was detected. Right Atrium: Right atrial size was moderately dilated. Pericardium: There is no evidence of pericardial effusion. Mitral Valve: The mitral valve is normal in structure. No evidence of mitral valve regurgitation. No evidence of mitral valve stenosis. There is no evidence of mitral valve vegetation. Tricuspid Valve: The  tricuspid valve is normal in structure. Tricuspid valve regurgitation is moderate . No evidence of tricuspid stenosis. There is no evidence of tricuspid valve vegetation. Aortic Valve: The aortic valve is tricuspid. Aortic valve regurgitation is not visualized. No aortic stenosis is present. There is no evidence of aortic valve vegetation. Pulmonic Valve: The pulmonic valve was normal in structure. Pulmonic valve regurgitation is not visualized. No evidence of pulmonic stenosis. Aorta: The aortic root is normal in size and structure. There is moderate (Grade III) atheroma plaque involving the aortic arch and descending aorta. Venous: The inferior vena cava is normal in size with greater than 50% respiratory variability, suggesting right atrial pressure of 3 mmHg. IAS/Shunts: No atrial level shunt detected by color flow Doppler. Agitated saline contrast was given intravenously to evaluate for intracardiac shunting. Agitated saline contrast bubble study was negative, with no evidence of any interatrial shunt. There  is no evidence of a patent foramen ovale. There is no evidence of an atrial septal defect.  TRICUSPID VALVE TR Peak grad:   58.4 mmHg TR Vmax:        382.00 cm/s Evalene Lunger MD Electronically signed by Evalene Lunger MD Signature Date/Time: 09/03/2023/1:51:45 PM    Final      Medications:    sodium chloride      anticoagulant sodium citrate      anticoagulant sodium citrate      metronidazole  500 mg (09/04/23 0521)   [START ON 09/06/2023] vancomycin       amiodarone   200 mg Oral BID   apixaban   2.5 mg Oral BID   aspirin  EC  81 mg Oral Daily   carvedilol   6.25 mg Oral Daily   Chlorhexidine  Gluconate Cloth  6 each Topical Q0600   epoetin  alfa-epbx (RETACRIT ) injection  10,000 Units Intravenous Q M,W,F-HD   iron  polysaccharides  150 mg Oral Daily   leptospermum manuka honey  1 Application Topical Daily   multivitamin  1 tablet Oral QHS   rosuvastatin   40 mg Oral Daily   senna  1 tablet Oral  Daily   sevelamer  carbonate  2,400 mg Oral TID WC   traZODone   100 mg Oral QHS  acetaminophen , albuterol , alteplase , alteplase , alteplase , anticoagulant sodium citrate , anticoagulant sodium citrate , dextromethorphan -guaiFENesin , feeding supplement (NEPRO CARB STEADY), heparin , heparin , heparin , heparin , lidocaine  (PF), lidocaine -prilocaine , melatonin, midodrine , oxyCODONE -acetaminophen , pentafluoroprop-tetrafluoroeth  Assessment/ Plan:  Adam Howell is a 63 y.o.  male  with end stage renal disease on hemodialysis, diabetes, hypertension, CAD, Rt AKA, anemia, CHF who was admitted to [facility] on 08/29/2023 for revision of AVF. Patient had anaphylactic reaction and admitted to ICU.    [facility] [place]/MWF/left AVG removed   End-stage renal disease with hyperkalemia on hemodialysis.   - Jump graft removed 09/01/2023.   - Patient seen and evaluated during dialysis treatment today on 09/03/2023.  We plan to complete treatment today and next treatment will be on Monday.   2. Anemia of chronic kidney disease Recent Labs       Lab Results  Component Value Date    HGB 8.3 (L) 09/04/2023      Continue Epogen  10,000 IV with dialysis.   3. Secondary Hyperparathyroidism: with outpatient labs:     Recent Labs       Lab Results  Component Value Date    CALCIUM  8.3 (L) 09/04/2023    CAION 1.06 (L) 08/05/2023    PHOS 3.4  09/04/2023    Phosphorus improved to 3.4 continue Renvela  with meals  4. Pneumonia, sepsis and shortness of breath - Blood cultures (6/16) positive for MRSA and gram-positive rods     - iv Abx as per hospitalist team Currently on room air                LOS: 6 Shakima Nisley P Jeff Frieden 6/21/202510:08 AM

## 2023-09-05 DIAGNOSIS — J189 Pneumonia, unspecified organism: Secondary | ICD-10-CM | POA: Diagnosis not present

## 2023-09-05 DIAGNOSIS — N186 End stage renal disease: Secondary | ICD-10-CM | POA: Diagnosis not present

## 2023-09-05 DIAGNOSIS — R7881 Bacteremia: Secondary | ICD-10-CM | POA: Diagnosis not present

## 2023-09-05 DIAGNOSIS — B9562 Methicillin resistant Staphylococcus aureus infection as the cause of diseases classified elsewhere: Secondary | ICD-10-CM | POA: Diagnosis not present

## 2023-09-05 LAB — CULTURE, BLOOD (SINGLE)
Culture: NO GROWTH
Special Requests: ADEQUATE

## 2023-09-05 MED ORDER — CHLORHEXIDINE GLUCONATE CLOTH 2 % EX PADS
6.0000 | MEDICATED_PAD | Freq: Every day | CUTANEOUS | Status: DC
  Administered 2023-09-06 – 2023-09-07 (×2): 6 via TOPICAL

## 2023-09-05 MED ORDER — ONDANSETRON HCL 4 MG/2ML IJ SOLN
4.0000 mg | Freq: Three times a day (TID) | INTRAMUSCULAR | Status: DC | PRN
  Administered 2023-09-05: 4 mg via INTRAVENOUS
  Filled 2023-09-05: qty 2

## 2023-09-05 NOTE — Progress Notes (Signed)
 [facility]  ROUNDING NOTE   Subjective:  Adam Howell is a 63 y.o male with past medical history of diabetes, hypertension, CAD, Rt AKA, anemia, CHF, and ESRD on dialysis. Patient presents to vascular surgery for revision of AVF. He is being admitted for Aspiration pneumonia (HCC) [J69.0] HCAP (healthcare-associated pneumonia) [J18.9] Pneumonia of both lungs due to infectious organism, unspecified part of lung [J18.9] Acute hypoxic respiratory failure (HCC) [J96.01]  Update: Patient seen and evaluated at bedside. Denies shortness of breath. No complaints. Patient acknowledges dialysis tomorrow.    Objective:  Vital signs in last 24 hours:  Temp:  [98 F (36.7 C)-98.6 F (37 C)] 98 F (36.7 C) (06/22 0841) Pulse Rate:  [63-69] 65 (06/22 0841) Resp:  [16-20] 19 (06/22 0841) BP: (121-156)/(62-77) 131/77 (06/22 0841) SpO2:  [82 %-100 %] 82 % (06/22 0841) Weight:  [105.1 kg] 105.1 kg (06/22 0451)  Weight change: 0.7 kg Filed Weights   09/03/23 1244 09/04/23 0400 09/05/23 0451  Weight: 103 kg 104.1 kg 105.1 kg    Intake/Output: I/O last 3 completed shifts: In: 560 [P.O.:560] Out: -    Intake/Output this shift:  No intake/output data recorded.  Physical Exam: General: NAD,   Head: Normocephalic, atraumatic. Moist oral mucosal membranes  Eyes: Anicteric, PERRL  Neck: Supple, trachea midline  Lungs:  On room air, scattered rales to auscultation  Heart: Regular rate and rhythm  Abdomen:  Soft, nontender,   Extremities:  No peripheral edema.  Neurologic: Nonfocal, moving all four extremities  Skin: No lesions  Access: Left Fem Tempvascath    Basic Metabolic Panel: Recent Labs  Lab 08/30/23 0405 08/31/23 0347 09/01/23 0416 09/03/23 0417 09/04/23 0437  NA 138 133* 133* 135 134*  K 4.7 4.3 4.3 4.7 4.2  CL 94* 95* 94* 94* 97*  CO2 31 25 27 28 26   GLUCOSE 94 87 100* 142* 102*  BUN 47* 32* 41* 60* 33*  CREATININE 10.35* 7.18* 9.14* 12.41* 7.94*   CALCIUM  8.1* 8.0* 8.2* 8.1* 8.3*  PHOS  --   --   --  5.0* 3.4    Liver Function Tests: Recent Labs  Lab 08/29/23 1958 09/03/23 0417 09/04/23 0437  AST 22  --   --   ALT 13  --   --   ALKPHOS 69  --   --   BILITOT 0.9  --   --   PROT 6.7  --   --   ALBUMIN  2.7* 2.4* 2.6*   No results for input(s): LIPASE, AMYLASE in the last 168 hours. No results for input(s): AMMONIA in the last 168 hours.  CBC: Recent Labs  Lab 08/29/23 1958 08/30/23 0405 08/30/23 1743 08/31/23 0347 09/01/23 0416 09/03/23 0417 09/04/23 0437  WBC 3.8* 4.3  --  5.2 4.5 4.6 4.1  NEUTROABS 2.4  --   --   --   --   --   --   HGB 7.4* 7.0* 8.8* 8.7* 8.0* 7.7* 8.3*  HCT 25.1* 23.7* 28.9* 28.5* 26.4* 24.2* 27.5*  MCV 94.7 94.4  --  91.9 90.7 92.4 90.5  PLT 95* 99*  --  88* 91* 158 211    Cardiac Enzymes: No results for input(s): CKTOTAL, CKMB, CKMBINDEX, TROPONINI in the last 168 hours.  BNP: Invalid input(s): POCBNP  CBG: Recent Labs  Lab 09/01/23 1326  GLUCAP 96    Microbiology: Results for orders placed or performed during the hospital encounter of 08/29/23  Resp panel by RT-PCR (RSV, Flu A&B, Covid)  Anterior Nasal Swab     Status: None   Collection Time: 08/29/23  8:00 PM   Specimen: Anterior Nasal Swab  Result Value Ref Range Status   SARS Coronavirus 2 by RT PCR NEGATIVE NEGATIVE Final    Comment: (NOTE) SARS-CoV-2 target nucleic acids are NOT DETECTED.  The SARS-CoV-2 RNA is generally detectable in upper respiratory specimens during the acute phase of infection. The lowest concentration of SARS-CoV-2 viral copies this assay can detect is 138 copies/mL. A negative result does not preclude SARS-Cov-2 infection and should not be used as the sole basis for treatment or other patient management decisions. A negative result may occur with  improper specimen collection/handling, submission of specimen other than nasopharyngeal swab, presence of viral mutation(s) within  the areas targeted by this assay, and inadequate number of viral copies(<138 copies/mL). A negative result must be combined with clinical observations, patient history, and epidemiological information. The expected result is Negative.  Fact Sheet for Patients:  BloggerCourse.com  Fact Sheet for Healthcare Providers:  SeriousBroker.it  This test is no t yet approved or cleared by the [place]  FDA and  has been authorized for detection and/or diagnosis of SARS-CoV-2 by FDA under an Emergency Use Authorization (EUA). This EUA will remain  in effect (meaning this test can be used) for the duration of the COVID-19 declaration under Section 564(b)(1) of the Act, 21 U.S.C.section 360bbb-3(b)(1), unless the authorization is terminated  or revoked sooner.       Influenza A by PCR NEGATIVE NEGATIVE Final   Influenza B by PCR NEGATIVE NEGATIVE Final    Comment: (NOTE) The Xpert Xpress SARS-CoV-2/FLU/RSV plus assay is intended as an aid in the diagnosis of influenza from Nasopharyngeal swab specimens and should not be used as a sole basis for treatment. Nasal washings and aspirates are unacceptable for Xpert Xpress SARS-CoV-2/FLU/RSV testing.  Fact Sheet for Patients: BloggerCourse.com  Fact Sheet for Healthcare Providers: SeriousBroker.it  This test is not yet approved or cleared by the [place]  FDA and has been authorized for detection and/or diagnosis of SARS-CoV-2 by FDA under an Emergency Use Authorization (EUA). This EUA will remain in effect (meaning this test can be used) for the duration of the COVID-19 declaration under Section 564(b)(1) of the Act, 21 U.S.C. section 360bbb-3(b)(1), unless the authorization is terminated or revoked.     Resp Syncytial Virus by PCR NEGATIVE NEGATIVE Final    Comment: (NOTE) Fact Sheet for  Patients: BloggerCourse.com  Fact Sheet for Healthcare Providers: SeriousBroker.it  This test is not yet approved or cleared by the [place]  FDA and has been authorized for detection and/or diagnosis of SARS-CoV-2 by FDA under an Emergency Use Authorization (EUA). This EUA will remain in effect (meaning this test can be used) for the duration of the COVID-19 declaration under Section 564(b)(1) of the Act, 21 U.S.C. section 360bbb-3(b)(1), unless the authorization is terminated or revoked.  Performed at [facility], [address] [place], [place] [address]   Blood Culture (routine x 2)     Status: Abnormal   Collection Time: 08/30/23 12:06 AM   Specimen: BLOOD RIGHT HAND  Result Value Ref Range Status   Specimen Description   Final    BLOOD RIGHT HAND Performed at [facility], [address] [place], [place] [address]    Special Requests   Final    BOTTLES DRAWN AEROBIC AND ANAEROBIC Blood Culture results may not be optimal due to an inadequate volume of blood received in culture  bottles Performed at [facility], [address] [place], [place] [address]    Culture  Setup Time   Final    GRAM POSITIVE COCCI IN BOTH AEROBIC AND ANAEROBIC BOTTLES CRITICAL RESULT CALLED TO, READ BACK BY AND VERIFIED WITH: MOSE BLEW 08/30/23 1230 MW Performed at [facility], [address] [place], [place] [address]    Culture (A)  Final    STAPHYLOCOCCUS AUREUS SUSCEPTIBILITIES PERFORMED ON PREVIOUS CULTURE WITHIN THE LAST 5 DAYS. Performed at [facility], [address], [place], [place] [address]    Report Status 09/01/2023 FINAL  Final  Blood Culture (routine x 2)     Status: Abnormal   Collection Time: 08/30/23 12:06 AM   Specimen: BLOOD RIGHT ARM  Result Value Ref Range Status   Specimen Description   Final    BLOOD RIGHT ARM Performed at [facility], [address] [place], [place] [address]    Special Requests   Final    BOTTLES DRAWN AEROBIC AND ANAEROBIC Blood Culture results may not be optimal due to an inadequate volume of blood received in culture bottles Performed at [facility], [address] [place], [place] [address]    Culture  Setup Time   Final    GRAM POSITIVE COCCI IN BOTH AEROBIC AND ANAEROBIC BOTTLES CRITICAL RESULT CALLED TO, READ BACK BY AND VERIFIED WITH: TREY GREENWOOD 08/30/23 1230 MW GRAM POSITIVE RODS ANAEROBIC BOTTLE ONLY CRITICAL RESULT CALLED TO, READ BACK BY AND VERIFIED WITH: PHARMD W. ANDERSON [id number] @1700  FH Performed at [facility], [address], [place], [place] [address]    Culture (A)  Final    METHICILLIN RESISTANT STAPHYLOCOCCUS AUREUS CLOSTRIDIUM PERFRINGENS    Report Status 09/01/2023 FINAL  Final   Organism ID, Bacteria METHICILLIN RESISTANT STAPHYLOCOCCUS AUREUS  Final      Susceptibility   Methicillin resistant staphylococcus aureus - MIC*    CIPROFLOXACIN >=8 RESISTANT Resistant     ERYTHROMYCIN >=8 RESISTANT Resistant     GENTAMICIN <=0.5 SENSITIVE Sensitive     OXACILLIN >=4 RESISTANT Resistant     TETRACYCLINE >=16 RESISTANT Resistant     VANCOMYCIN  1 SENSITIVE Sensitive     TRIMETH /SULFA  <=10 SENSITIVE Sensitive     CLINDAMYCIN >=8 RESISTANT Resistant     RIFAMPIN <=0.5 SENSITIVE Sensitive     Inducible Clindamycin NEGATIVE Sensitive     LINEZOLID  2 SENSITIVE Sensitive     * METHICILLIN RESISTANT STAPHYLOCOCCUS AUREUS  Blood Culture ID Panel (Reflexed)     Status: Abnormal   Collection Time: 08/30/23 12:06 AM  Result Value Ref Range Status   Enterococcus faecalis NOT DETECTED NOT DETECTED Final   Enterococcus Faecium NOT DETECTED NOT DETECTED Final   Listeria monocytogenes NOT DETECTED NOT DETECTED Final   Staphylococcus species DETECTED (A) NOT DETECTED Final    Comment: CRITICAL RESULT CALLED TO, READ BACK BY AND VERIFIED WITH: TREY GREENWOOD 08/30/23 1230 MW     Staphylococcus aureus (BCID) DETECTED (A) NOT DETECTED Final    Comment: Methicillin (oxacillin)-resistant Staphylococcus aureus (MRSA). MRSA is predictably resistant to beta-lactam antibiotics (except ceftaroline). Preferred therapy is vancomycin  unless clinically contraindicated. Patient requires contact precautions if  hospitalized. CRITICAL RESULT CALLED TO, READ BACK BY AND VERIFIED WITH: TREY GREENWOOD 08/30/23 1230 MW    Staphylococcus epidermidis NOT DETECTED NOT DETECTED Final   Staphylococcus lugdunensis NOT DETECTED NOT DETECTED Final   Streptococcus species NOT DETECTED NOT DETECTED Final   Streptococcus agalactiae NOT DETECTED  NOT DETECTED Final   Streptococcus pneumoniae NOT DETECTED NOT DETECTED Final   Streptococcus pyogenes NOT DETECTED NOT DETECTED Final   A.calcoaceticus-baumannii NOT DETECTED NOT DETECTED Final   Bacteroides fragilis NOT DETECTED NOT DETECTED Final   Enterobacterales NOT DETECTED NOT DETECTED Final   Enterobacter cloacae complex NOT DETECTED NOT DETECTED Final   Escherichia coli NOT DETECTED NOT DETECTED Final   Klebsiella aerogenes NOT DETECTED NOT DETECTED Final   Klebsiella oxytoca NOT DETECTED NOT DETECTED Final   Klebsiella pneumoniae NOT DETECTED NOT DETECTED Final   Proteus species NOT DETECTED NOT DETECTED Final   Salmonella species NOT DETECTED NOT DETECTED Final   Serratia marcescens NOT DETECTED NOT DETECTED Final   Haemophilus influenzae NOT DETECTED NOT DETECTED Final   Neisseria meningitidis NOT DETECTED NOT DETECTED Final   Pseudomonas aeruginosa NOT DETECTED NOT DETECTED Final   Stenotrophomonas maltophilia NOT DETECTED NOT DETECTED Final   Candida albicans NOT DETECTED NOT DETECTED Final   Candida auris NOT DETECTED NOT DETECTED Final   Candida glabrata NOT DETECTED NOT DETECTED Final   Candida krusei NOT DETECTED NOT DETECTED Final   Candida parapsilosis NOT DETECTED NOT DETECTED Final   Candida tropicalis NOT DETECTED NOT  DETECTED Final   Cryptococcus neoformans/gattii NOT DETECTED NOT DETECTED Final   Meth resistant mecA/C and MREJ DETECTED (A) NOT DETECTED Final    Comment: CRITICAL RESULT CALLED TO, READ BACK BY AND VERIFIED WITH: MOSE BLEW 08/30/23 1230 MW Performed at [facility], [address] [place], [place] [address]   MRSA Next Gen by PCR, Nasal     Status: Abnormal   Collection Time: 08/30/23  2:53 PM   Specimen: Nasal Mucosa; Nasal Swab  Result Value Ref Range Status   MRSA by PCR Next Gen DETECTED (A) NOT DETECTED Final    Comment: CRITICAL RESULT CALLED TO, READ BACK BY AND VERIFIED WITH:  ZADA GARRISON RN 08/30/2023 @1624  KKG (NOTE) The GeneXpert MRSA Assay (FDA approved for NASAL specimens only), is one component of a comprehensive MRSA colonization surveillance program. It is not intended to diagnose MRSA infection nor to guide or monitor treatment for MRSA infections. Test performance is not FDA approved in patients less than 63 years old. Performed at [facility], [address] [place], [place] [address]   Cath Tip Culture     Status: None   Collection Time: 08/31/23  8:50 AM   Specimen: Catheter Tip; Other  Result Value Ref Range Status   Specimen Description   Final    CATH TIP Performed at [facility], [address], [place], [place] [address]    Special Requests   Final    NONE Performed at [facility], [address] [place], [place] [address]    Culture   Final    NO GROWTH 2 DAYS Performed at [facility], [address], [place], [place] [address]    Report Status 09/02/2023 FINAL  Final  Culture, blood (single) w Reflex to ID Panel     Status: None   Collection Time: 08/31/23 10:12 AM   Specimen: BLOOD  Result Value Ref Range Status   Specimen Description BLOOD RIGHT FA  Final   Special Requests   Final    BOTTLES DRAWN AEROBIC AND ANAEROBIC Blood Culture adequate volume   Culture   Final    NO GROWTH 5  DAYS Performed at [facility], [address] [place], [place] [address]    Report Status 09/05/2023  FINAL  Final  Aerobic/Anaerobic Culture w Gram Stain (surgical/deep wound)     Status: None (Preliminary result)   Collection Time: 09/01/23 12:21 PM   Specimen: Path Tissue  Result Value Ref Range Status   Specimen Description   Final    TISSUE Performed at [facility], [address], [place], [place] [address]    Special Requests   Final    NONE Performed at [facility] [address] [place], [place] [address]    Gram Stain   Final    NO WBC SEEN NO ORGANISMS SEEN Performed at [facility], [address], [place], [place] [address]    Culture   Final    FEW METHICILLIN RESISTANT STAPHYLOCOCCUS AUREUS NO ANAEROBES ISOLATED; CULTURE IN PROGRESS FOR 5 DAYS    Report Status PENDING  Incomplete   Organism ID, Bacteria METHICILLIN RESISTANT STAPHYLOCOCCUS AUREUS  Final      Susceptibility   Methicillin resistant staphylococcus aureus - MIC*    CIPROFLOXACIN >=8 RESISTANT Resistant     ERYTHROMYCIN >=8 RESISTANT Resistant     GENTAMICIN <=0.5 SENSITIVE Sensitive     OXACILLIN >=4 RESISTANT Resistant     TETRACYCLINE >=16 RESISTANT Resistant     VANCOMYCIN  1 SENSITIVE Sensitive     TRIMETH /SULFA  <=10 SENSITIVE Sensitive     CLINDAMYCIN >=8 RESISTANT Resistant     RIFAMPIN <=0.5 SENSITIVE Sensitive     Inducible Clindamycin NEGATIVE Sensitive     LINEZOLID  2 SENSITIVE Sensitive     * FEW METHICILLIN RESISTANT STAPHYLOCOCCUS AUREUS  Culture, blood (Routine X 2) w Reflex to ID Panel     Status: None (Preliminary result)   Collection Time: 09/02/23  8:08 AM   Specimen: BLOOD  Result Value Ref Range Status   Specimen Description BLOOD RIGHT ANTECUBITAL  Final   Special Requests   Final    BOTTLES DRAWN AEROBIC AND ANAEROBIC Blood Culture adequate volume   Culture   Final    NO GROWTH 3 DAYS Performed at [facility], [address], [place], [place] [address]    Report Status PENDING  Incomplete  Culture, blood (Routine X 2) w Reflex to ID Panel     Status: None (Preliminary result)   Collection Time: 09/02/23  8:08 AM   Specimen: BLOOD  Result Value Ref Range Status   Specimen Description BLOOD BLOOD RIGHT FOREARM  Final   Special Requests   Final    BOTTLES DRAWN AEROBIC AND ANAEROBIC Blood Culture adequate volume   Culture   Final    NO GROWTH 3 DAYS Performed at [facility], [address] [place], [place] [address]    Report Status PENDING  Incomplete  Expectorated Sputum Assessment w Gram Stain, Rflx to Resp Cult     Status: None   Collection Time: 09/04/23  3:13 AM   Specimen: Expectorated Sputum  Result Value Ref Range Status   Specimen Description EXPECTORATED SPUTUM  Final   Special Requests NONE  Final   Sputum evaluation   Final    Sputum specimen not acceptable for testing.  Please recollect.   RESULT CALLED TO, READ BACK BY AND VERIFIED WITH: MARCELITA WINT @ 09/04/23 0448 AB Performed at [facility], [address] [place], [place] [address]    Report Status 09/04/2023 FINAL  Final    Coagulation Studies: No results for input(s): LABPROT, INR in the last 72 hours.  Urinalysis: No results for input(s): COLORURINE, LABSPEC, PHURINE, GLUCOSEU,  HGBUR, BILIRUBINUR, KETONESUR, PROTEINUR, UROBILINOGEN, NITRITE, LEUKOCYTESUR in the last 72 hours.  Invalid input(s): APPERANCEUR    Imaging: ECHO TEE Result Date: 09/03/2023    TRANSESOPHOGEAL ECHO REPORT   Patient Name:   Adam Howell Date of Exam: 09/03/2023 Medical Rec #:  [id number]           Height:       75.0 in Accession #:    [id number]          Weight:       227.1 lb Date of Birth:  March 28, 1960           BSA:          2.316 m Patient Age:    63 years            BP:           111/63 mmHg Patient Gender: M                   HR:           65 bpm. Exam Location:  [facility] Procedure:  Transesophageal Echo, Cardiac Doppler, Color Doppler and Saline            Contrast Bubble Study (Both Spectral and Color Flow Doppler were            utilized during procedure). Indications:     Bacteremia  History:         Patient has prior history of Echocardiogram examinations, most                  recent 09/01/2023. CHF; Risk Factors:Hypertension and Diabetes.                  Chronic kidney disease, dialysis pt.  Sonographer:     Christopher Furnace Referring Phys:  [id number] SHERI HAMMOCK Diagnosing Phys: Evalene Lunger MD PROCEDURE: After discussion of the risks and benefits of a TEE, an informed consent was obtained from the patient. TEE procedure time was 30 minutes. The transesophogeal probe was passed without difficulty through the esophogus of the patient. Local oropharyngeal anesthetic was provided with Cetacaine  and viscous lidocaine . Sedation performed by different physician. Image quality was excellent. The patient's vital signs; including heart rate, blood pressure, and oxygen saturation; remained stable throughout the procedure. The patient developed no complications during the procedure.  IMPRESSIONS  1. No valve vegetation noted  2. Left ventricular ejection fraction, by estimation, is 55 to 60%. The left ventricle has normal function. The left ventricle has no regional wall motion abnormalities.  3. Right ventricular systolic function is moderately reduced. The right ventricular size is mildly enlarged. There is severely elevated pulmonary artery systolic pressure. The estimated right ventricular systolic pressure is 68.4 mmHg.  4. Left atrial size was moderately dilated. No left atrial/left atrial appendage thrombus was detected.  5. Right atrial size was moderately dilated.  6. The mitral valve is normal in structure. No evidence of mitral valve regurgitation. No evidence of mitral stenosis.  7. Tricuspid valve regurgitation is moderate.  8. The aortic valve is tricuspid. Aortic valve regurgitation is  not visualized. No aortic stenosis is present.  9. There is Moderate (Grade III) atheroma plaque involving the aortic arch and descending aorta. 10. The inferior vena cava is normal in size with greater than 50% respiratory variability, suggesting right atrial pressure of 3 mmHg. 11. Agitated saline contrast bubble study was negative, with no evidence of any interatrial shunt. Conclusion(s)/Recommendation(s): Normal biventricular function without  evidence of hemodynamically significant valvular heart disease. FINDINGS  Left Ventricle: Left ventricular ejection fraction, by estimation, is 55 to 60%. The left ventricle has normal function. The left ventricle has no regional wall motion abnormalities. The left ventricular internal cavity size was normal in size. There is  no left ventricular hypertrophy. Right Ventricle: The right ventricular size is mildly enlarged. No increase in right ventricular wall thickness. Right ventricular systolic function is moderately reduced. There is severely elevated pulmonary artery systolic pressure. The tricuspid regurgitant velocity is 3.82 m/s, and with an assumed right atrial pressure of 10 mmHg, the estimated right ventricular systolic pressure is 68.4 mmHg. Left Atrium: Left atrial size was moderately dilated. No left atrial/left atrial appendage thrombus was detected. Right Atrium: Right atrial size was moderately dilated. Pericardium: There is no evidence of pericardial effusion. Mitral Valve: The mitral valve is normal in structure. No evidence of mitral valve regurgitation. No evidence of mitral valve stenosis. There is no evidence of mitral valve vegetation. Tricuspid Valve: The tricuspid valve is normal in structure. Tricuspid valve regurgitation is moderate . No evidence of tricuspid stenosis. There is no evidence of tricuspid valve vegetation. Aortic Valve: The aortic valve is tricuspid. Aortic valve regurgitation is not visualized. No aortic stenosis is present. There  is no evidence of aortic valve vegetation. Pulmonic Valve: The pulmonic valve was normal in structure. Pulmonic valve regurgitation is not visualized. No evidence of pulmonic stenosis. Aorta: The aortic root is normal in size and structure. There is moderate (Grade III) atheroma plaque involving the aortic arch and descending aorta. Venous: The inferior vena cava is normal in size with greater than 50% respiratory variability, suggesting right atrial pressure of 3 mmHg. IAS/Shunts: No atrial level shunt detected by color flow Doppler. Agitated saline contrast was given intravenously to evaluate for intracardiac shunting. Agitated saline contrast bubble study was negative, with no evidence of any interatrial shunt. There  is no evidence of a patent foramen ovale. There is no evidence of an atrial septal defect.  TRICUSPID VALVE TR Peak grad:   58.4 mmHg TR Vmax:        382.00 cm/s Evalene Lunger MD Electronically signed by Evalene Lunger MD Signature Date/Time: 09/03/2023/1:51:45 PM    Final      Medications:    metronidazole  500 mg (09/05/23 0534)   [START ON 09/06/2023] vancomycin       amiodarone   200 mg Oral BID   apixaban   2.5 mg Oral BID   aspirin  EC  81 mg Oral Daily   carvedilol   6.25 mg Oral Daily   [START ON 09/06/2023] Chlorhexidine  Gluconate Cloth  6 each Topical Q0600   epoetin  alfa-epbx (RETACRIT ) injection  10,000 Units Intravenous Q M,W,F-HD   iron  polysaccharides  150 mg Oral Daily   leptospermum manuka honey  1 Application Topical Daily   multivitamin  1 tablet Oral QHS   rosuvastatin   40 mg Oral Daily   senna  1 tablet Oral Daily   sevelamer  carbonate  2,400 mg Oral TID WC   traZODone   100 mg Oral QHS   acetaminophen , albuterol , dextromethorphan -guaiFENesin , feeding supplement (NEPRO CARB STEADY), lidocaine  (PF), lidocaine -prilocaine , melatonin, midodrine , oxyCODONE -acetaminophen , pentafluoroprop-tetrafluoroeth  Assessment/ Plan:  [name] is a 63 y.o.  male   with end stage renal disease on hemodialysis, diabetes, hypertension, CAD, Rt AKA, anemia, CHF who was admitted to [facility] on 08/29/2023 for revision of AVF. Patient had anaphylactic reaction and admitted to ICU.  Patient presented to the hospital for shortness  of breath.  He was diagnosed with pneumonia and is being admitted for further management.  Blood cultures are positive for staph species and MRSA in addition to gram-positive rods.  Femoral tunneled dialysis catheter was removed 6-17/25.    [facility]/MWF/left AVG removed   End-stage renal disease with hyperkalemia on hemodialysis.   - Jump graft removed 09/01/2023. Plan to consult Vascular for tunneled catheter prior to discharge next week. - Patient seen and evaluated at bedside today. Plan to dialyze as scheduled tomorrow   2. Anemia of chronic kidney disease Recent Labs           Lab Results  Component Value Date    HGB 8.3 (L) 09/04/2023      Continue Epogen  10,000 IV with dialysis.   3. Secondary Hyperparathyroidism: with outpatient labs:     Recent Labs           Lab Results  Component Value Date    CALCIUM  8.3 (L) 09/04/2023    CAION 1.06 (L) 08/05/2023    PHOS 3.4  09/04/2023    Phosphorus improved to 3.4 continue Renvela  with meals   4. Pneumonia, sepsis and shortness of breath - Blood cultures (6/16) positive for MRSA and gram-positive rods     - iv Abx as per hospitalist team Currently on room air   LOS: 7 Antanisha Mohs P Dula Havlik 6/22/20259:43 AM

## 2023-09-05 NOTE — Progress Notes (Signed)
 PROGRESS NOTE Adam Howell  FMW:[id number] DOB: 05/16/1960 DOA: 08/29/2023 PCP: Valerio Melanie DASEN, NP  Adam Howell is a 63 y.o. male with medical history significant of ESRD-HD (MWF), HTN, HLD, DM, CAD, dCHF, A fib on Eliquis , amenia, chronic pain, s/p of right AKA, who presents with SOB. Of note, had complicated admission from 5/22 - 5/29 including intubation and pressor use in ICU.   Had 2L O2 requirement on presentation. Treated for aspiration pneumonia. Now weaned to room air.  Also found to have MRSA bacteremia with source suspected to be recent temp cath or AVF site. TEE neg vegetation. Undergoing minimum of 6 weeks vancomycin  with HD. Ongoing management of vascular access modifications per vascular surgery. ID following for Abx guidance. Nephrology managing HD. PT/OT recommended SNF but patient declined and will go home with his daughter instead with HH. Dc is pending more stable HD access. Vascular tentatively planning permcath placement this week.    Assessment & Plan:   Principal Problem:   Pneumonia of both lungs due to infectious organism Active Problems:   Aspiration pneumonia (HCC)   CAD (coronary artery disease)   Myocardial injury   ESRD on dialysis [facility])   Atrial fibrillation, chronic (HCC)   Type 2 diabetes mellitus with ESRD (end-stage renal disease) (HCC)   Hyperlipidemia associated with type 2 diabetes mellitus (HCC)   Chronic diastolic CHF (congestive heart failure) (HCC)   Chronic pain syndrome   Pancytopenia (HCC)   Anemia in ESRD (end-stage renal disease) (HCC)   Overweight (BMI 25.0-29.9)   Encounter for dialysis catheter care [facility])   Acute hypoxic respiratory failure (HCC)   Bacteremia   Endocarditis  Assessment and Plan:  HAP  vs. aspiration pneumonia: Does not meet criteria for sepsis. Weaned to room air - MRSA swab positive - incentive spirometer and ambulate as much as tolerated  MRSA bacteremia: source possible HD catheter in R groin  vs previous AVF site. S/p HD catheter removed by vasc surg on 08/31/23. Now has new temp cath on L groin. S/p graft excision 6/18. TEE neg.  - continue vanco as per ID. Repeat blood cultures drawn 6/19, 6/17 NGTD. ID following and recs appreciated  - added flagyl  for clostridium coverage - f/u catheter tip culture, wound culture - minimum 6 week IV Abx with HD  ESRD: on HD MWF.  - Nephro following and recs  - vascular consulted. Temp cath removed 6/17 from R groin. Graft excision 6/18. Placed temp cath on L groin. Plan for permcath this week. - NPO at midnight for potential procedure in morning  Hx of CAD & myocardial injury: trop 107, no chest pain.  Likely due to demand ischemia versus decreased clearance of troponin in the setting of ESRD. Continue on aspirin , statin   Left shin wound: present on admission. Wound care consulted   PAD  right AKA. Uses a wheelchair. Continue w/ supportive care - PT/OT- recommending SNF.  -  continue on statin    Chronic a. fib:  - continue on amio, coreg , hold for MAP and/or HR <65.  - Continue on eliquis     DM2: well-controlled glucose on labs. No need for insulin  currently    Chronic diastolic CHF: volume/fluid management w/ HD    Chronic pain syndrome: continue on home dose of percocet    Bicytopenia: w/ likely ACD secondary to ESRD. S/p 1 unit of pRBCs transfuse so far. Repeat H&H are trending up. Platelets are labile.   DVT prophylaxis: eliquis  Code Status: full  Family Communication: none at bedside Disposition Plan: SNF  Level of care: Telemetry Medical  Status is: Inpatient Remains inpatient appropriate because: vascular access modifications prior to SNF dc  Consultants:  ID Nephro  Vascular surgery   Procedures: HD, temp cath, excision of graft  Subjective: Pt reports feeling fine today. Denies pain in legs, groin, or arm. No SOB at rest. No questions.  Objective: Vitals:   09/04/23 1456 09/04/23 2001 09/05/23 0417  09/05/23 0451  BP: 121/68 (!) 156/76 127/62   Pulse: 63 69 69   Resp: 17 20 16    Temp: 98.6 F (37 C) 98.3 F (36.8 C) 98.2 F (36.8 C)   TempSrc:  Oral Oral   SpO2: 94% 100% 96%   Weight:    105.1 kg  Height:        Intake/Output Summary (Last 24 hours) at 09/05/2023 0742 Last data filed at 09/04/2023 1500 Gross per 24 hour  Intake 560 ml  Output --  Net 560 ml   Filed Weights   09/03/23 1244 09/04/23 0400 09/05/23 0451  Weight: 103 kg 104.1 kg 105.1 kg    Examination: General exam: appears lethargic  Respiratory system: normal respiratory effort Cardiovascular system: S1 & S2+. No rubs, gallops or clicks.  Gastrointestinal system: Abdomen is nondistended, soft and nontender. Normal bowel sounds heard. Central nervous system: lethargic  Psychiatry: Judgement and insight appears at baseline. Flat mood and affect  Data Reviewed: I have personally reviewed following labs and imaging studies  CBC: Recent Labs  Lab 08/29/23 1958 08/30/23 0405 08/30/23 1743 08/31/23 0347 09/01/23 0416 09/03/23 0417 09/04/23 0437  WBC 3.8* 4.3  --  5.2 4.5 4.6 4.1  NEUTROABS 2.4  --   --   --   --   --   --   HGB 7.4* 7.0* 8.8* 8.7* 8.0* 7.7* 8.3*  HCT 25.1* 23.7* 28.9* 28.5* 26.4* 24.2* 27.5*  MCV 94.7 94.4  --  91.9 90.7 92.4 90.5  PLT 95* 99*  --  88* 91* 158 211   Basic Metabolic Panel: Recent Labs  Lab 08/30/23 0405 08/31/23 0347 09/01/23 0416 09/03/23 0417 09/04/23 0437  NA 138 133* 133* 135 134*  K 4.7 4.3 4.3 4.7 4.2  CL 94* 95* 94* 94* 97*  CO2 31 25 27 28 26   GLUCOSE 94 87 100* 142* 102*  BUN 47* 32* 41* 60* 33*  CREATININE 10.35* 7.18* 9.14* 12.41* 7.94*  CALCIUM  8.1* 8.0* 8.2* 8.1* 8.3*  PHOS  --   --   --  5.0* 3.4   GFR: Estimated Creatinine Clearance: 12.6 mL/min (A) (by C-G formula based on SCr of 7.94 mg/dL (H)). Liver Function Tests: Recent Labs  Lab 08/29/23 1958 09/03/23 0417 09/04/23 0437  AST 22  --   --   ALT 13  --   --   ALKPHOS 69  --    --   BILITOT 0.9  --   --   PROT 6.7  --   --   ALBUMIN  2.7* 2.4* 2.6*    LOS: 7 days    [name], MD [facility]  If 7PM-7AM, please contact night-coverage [email or web address] 09/05/2023, 7:42 AM

## 2023-09-06 ENCOUNTER — Encounter: Payer: Self-pay | Admitting: Cardiovascular Disease

## 2023-09-06 ENCOUNTER — Encounter
Admission: EM | Disposition: A | Discharge status: Home Health | Source: Home / Self Care | Attending: Student in an Organized Health Care Education/Training Program

## 2023-09-06 DIAGNOSIS — N186 End stage renal disease: Secondary | ICD-10-CM | POA: Diagnosis not present

## 2023-09-06 DIAGNOSIS — Z9889 Other specified postprocedural states: Secondary | ICD-10-CM

## 2023-09-06 DIAGNOSIS — B9562 Methicillin resistant Staphylococcus aureus infection as the cause of diseases classified elsewhere: Secondary | ICD-10-CM | POA: Diagnosis not present

## 2023-09-06 DIAGNOSIS — R7881 Bacteremia: Secondary | ICD-10-CM | POA: Diagnosis not present

## 2023-09-06 DIAGNOSIS — J189 Pneumonia, unspecified organism: Secondary | ICD-10-CM | POA: Diagnosis not present

## 2023-09-06 DIAGNOSIS — Z992 Dependence on renal dialysis: Secondary | ICD-10-CM | POA: Diagnosis not present

## 2023-09-06 HISTORY — PX: DIALYSIS/PERMA CATHETER INSERTION: CATH118288

## 2023-09-06 LAB — RENAL FUNCTION PANEL
Albumin: 2.4 g/dL — ABNORMAL LOW (ref 3.5–5.0)
Anion gap: 13 (ref 5–15)
BUN: 48 mg/dL — ABNORMAL HIGH (ref 8–23)
CO2: 23 mmol/L (ref 22–32)
Calcium: 8.1 mg/dL — ABNORMAL LOW (ref 8.9–10.3)
Chloride: 97 mmol/L — ABNORMAL LOW (ref 98–111)
Creatinine, Ser: 11.08 mg/dL — ABNORMAL HIGH (ref 0.61–1.24)
GFR, Estimated: 5 mL/min — ABNORMAL LOW (ref >60)
Glucose, Bld: 102 mg/dL — ABNORMAL HIGH (ref 70–99)
Phosphorus: 3.7 mg/dL (ref 2.5–4.6)
Potassium: 5 mmol/L (ref 3.5–5.1)
Sodium: 133 mmol/L — ABNORMAL LOW (ref 135–145)

## 2023-09-06 LAB — AEROBIC/ANAEROBIC CULTURE W GRAM STAIN (SURGICAL/DEEP WOUND): Gram Stain: NONE SEEN

## 2023-09-06 LAB — CBC
HCT: 29 % — ABNORMAL LOW (ref 39.0–52.0)
Hemoglobin: 8.6 g/dL — ABNORMAL LOW (ref 13.0–17.0)
MCH: 26.8 pg (ref 26.0–34.0)
MCHC: 29.7 g/dL — ABNORMAL LOW (ref 30.0–36.0)
MCV: 90.3 fL (ref 80.0–100.0)
Platelets: 225 10*3/uL (ref 150–400)
RBC: 3.21 MIL/uL — ABNORMAL LOW (ref 4.22–5.81)
RDW: 18 % — ABNORMAL HIGH (ref 11.5–15.5)
WBC: 5 10*3/uL (ref 4.0–10.5)
nRBC: 0 % (ref 0.0–0.2)

## 2023-09-06 LAB — VANCOMYCIN, RANDOM: Vancomycin Rm: 32 ug/mL

## 2023-09-06 SURGERY — DIALYSIS/PERMA CATHETER INSERTION
Anesthesia: Moderate Sedation

## 2023-09-06 MED ORDER — MIDAZOLAM HCL 2 MG/2ML IJ SOLN
INTRAMUSCULAR | Status: AC
  Filled 2023-09-06: qty 2

## 2023-09-06 MED ORDER — FENTANYL CITRATE (PF) 100 MCG/2ML IJ SOLN
INTRAMUSCULAR | Status: DC | PRN
  Administered 2023-09-06: 50 ug via INTRAVENOUS

## 2023-09-06 MED ORDER — CEFAZOLIN SODIUM-DEXTROSE 2-4 GM/100ML-% IV SOLN
2.0000 g | INTRAVENOUS | Status: DC
Start: 2023-09-06 — End: 2023-09-06
  Filled 2023-09-06: qty 100

## 2023-09-06 MED ORDER — PHENOL 1.4 % MT LIQD
1.0000 | OROMUCOSAL | Status: DC | PRN
  Administered 2023-09-06 – 2023-09-07 (×2): 1 via OROMUCOSAL
  Filled 2023-09-06: qty 177

## 2023-09-06 MED ORDER — SODIUM CHLORIDE 0.9 % IV SOLN: INTRAVENOUS | Status: DC

## 2023-09-06 MED ORDER — HEPARIN SODIUM (PORCINE) 10000 UNIT/ML IJ SOLN
INTRAMUSCULAR | Status: DC | PRN
  Administered 2023-09-06: 10000 [IU]

## 2023-09-06 MED ORDER — HEPARIN SODIUM (PORCINE) 10000 UNIT/ML IJ SOLN
INTRAMUSCULAR | Status: AC
  Filled 2023-09-06: qty 1

## 2023-09-06 MED ORDER — SODIUM CHLORIDE 0.9% FLUSH: 3.0000 mL | INTRAVENOUS | Status: DC | PRN

## 2023-09-06 MED ORDER — MIDAZOLAM HCL 2 MG/2ML IJ SOLN
INTRAMUSCULAR | Status: DC | PRN
  Administered 2023-09-06: 2 mg via INTRAVENOUS

## 2023-09-06 MED ORDER — HEPARIN (PORCINE) IN NACL 1000-0.9 UT/500ML-% IV SOLN
INTRAVENOUS | Status: DC | PRN
  Administered 2023-09-06: 500 mL

## 2023-09-06 MED ORDER — EPOETIN ALFA-EPBX 10000 UNIT/ML IJ SOLN
INTRAMUSCULAR | Status: AC
Start: 2023-09-06 — End: 2023-09-06
  Filled 2023-09-06: qty 1

## 2023-09-06 MED ORDER — SODIUM CHLORIDE 0.9% FLUSH: 3.0000 mL | Freq: Two times a day (BID) | INTRAVENOUS | Status: DC

## 2023-09-06 MED ORDER — LIDOCAINE-EPINEPHRINE (PF) 1 %-1:200000 IJ SOLN
INTRAMUSCULAR | Status: DC | PRN
  Administered 2023-09-06: 20 mL

## 2023-09-06 MED ORDER — FENTANYL CITRATE PF 50 MCG/ML IJ SOSY
PREFILLED_SYRINGE | INTRAMUSCULAR | Status: AC
  Filled 2023-09-06: qty 1

## 2023-09-06 SURGICAL SUPPLY — 8 items
BIOPATCH RED 1 DISK 7.0 (GAUZE/BANDAGES/DRESSINGS) IMPLANT
CATH CANNON HEMO 15FR 23CM (HEMODIALYSIS SUPPLIES) IMPLANT
COVER PROBE ULTRASOUND 5X96 (MISCELLANEOUS) IMPLANT
DERMABOND ADVANCED .7 DNX12 (GAUZE/BANDAGES/DRESSINGS) IMPLANT
KIT MICROPUNCTURE VSI 5F STIFF (SHEATH) IMPLANT
PACK ANGIOGRAPHY (CUSTOM PROCEDURE TRAY) ×1 IMPLANT
SUT MNCRL AB 4-0 PS2 18 (SUTURE) IMPLANT
SUT PROLENE 0 CT 1 30 (SUTURE) IMPLANT

## 2023-09-06 NOTE — TOC Progression Note (Signed)
 Transition of Care [facility]) - Progression Note    Patient Details  Name: Adam Howell MRN: [id number] Date of Birth: 1960-10-09  Transition of Care [facility]) CM/SW Contact  Lauraine JAYSON Carpen, LCSW Phone Number: 09/06/2023, 4:47 PM  Clinical Narrative:  TOC continues to follow progress.   Expected Discharge Plan: Skilled Nursing Facility Barriers to Discharge: Continued Medical Work up  Expected Discharge Plan and Services     Post Acute Care Choice: Skilled Nursing Facility Living arrangements for the past 2 months: Single Family Home                                       Social Determinants of Health (SDOH) Interventions SDOH Screenings   Food Insecurity: No Food Insecurity (08/31/2023)  Housing: Low Risk  (08/31/2023)  Transportation Needs: No Transportation Needs (08/31/2023)  Utilities: Not At Risk (08/31/2023)  Alcohol Screen: Low Risk  (11/25/2021)  Depression (PHQ2-9): Low Risk  (04/14/2023)  Financial Resource Strain: Low Risk  (11/25/2021)  Physical Activity: Insufficiently Active (11/25/2021)  Social Connections: Moderately Isolated (12/29/2022)  Stress: No Stress Concern Present (11/25/2021)  Tobacco Use: Low Risk  (08/29/2023)    Readmission Risk Interventions    09/01/2023   11:18 AM 02/16/2023    6:21 PM  Readmission Risk Prevention Plan  Transportation Screening  Complete  PCP or Specialist Appt within 3-5 Days Complete   HRI or Home Care Consult Complete   Social Work Consult for Recovery Care Planning/Counseling Complete   Palliative Care Screening Not Applicable Not Applicable  Medication Review [employer])  Complete

## 2023-09-06 NOTE — Interval H&P Note (Signed)
 History and Physical Interval Note:  09/06/2023 9:33 AM  Adam Howell  has presented today for surgery, with the diagnosis of renal failure.  The various methods of treatment have been discussed with the patient and family. After consideration of risks, benefits and other options for treatment, the patient has consented to  Procedure(s): DIALYSIS/PERMA CATHETER INSERTION (N/A) as a surgical intervention.  The patient's history has been reviewed, patient examined, no change in status, stable for surgery.  I have reviewed the patient's chart and labs.  Questions were answered to the patient's satisfaction.     Elwyn Klosinski

## 2023-09-06 NOTE — Progress Notes (Signed)
 PROGRESS NOTE Adam Howell  FMW:[id number] DOB: [birth year] DOA: 08/29/2023 PCP: Valerio Melanie DASEN, NP  Adam Howell is a 63 y.o. male with medical history significant of ESRD-HD (MWF), HTN, HLD, DM, CAD, dCHF, A fib on Eliquis , amenia, chronic pain, s/p of right AKA, who presents with SOB. Of note, had complicated admission from 5/22 - 5/29 including intubation and pressor use in ICU.   Had 2L O2 requirement on presentation. Treated for aspiration pneumonia. Now weaned to room air.  Also found to have MRSA bacteremia with source suspected to be recent temp cath or AVF site. TEE neg vegetation. Undergoing minimum of 6 weeks vancomycin  with HD. Ongoing management of vascular access modifications per vascular surgery. ID following for Abx guidance. Nephrology managing HD. PT/OT recommended SNF but patient declined and will go home with his daughter instead with HH. Dc is pending more stable HD access. Vascular- permcath placement 6/23.    Assessment & Plan:   Principal Problem:   Pneumonia of both lungs due to infectious organism Active Problems:   Aspiration pneumonia (HCC)   CAD (coronary artery disease)   Myocardial injury   ESRD on dialysis [facility])   Atrial fibrillation, chronic (HCC)   Type 2 diabetes mellitus with ESRD (end-stage renal disease) (HCC)   Hyperlipidemia associated with type 2 diabetes mellitus (HCC)   Chronic diastolic CHF (congestive heart failure) (HCC)   Chronic pain syndrome   Pancytopenia (HCC)   Anemia in ESRD (end-stage renal disease) (HCC)   Overweight (BMI 25.0-29.9)   Encounter for dialysis catheter care [facility])   Acute hypoxic respiratory failure (HCC)   Bacteremia   Endocarditis  Assessment and Plan:  HAP  vs. aspiration pneumonia: Does not meet criteria for sepsis. Weaned to room air - MRSA swab positive - incentive spirometer and ambulate as much as tolerated  MRSA bacteremia: source possible HD catheter in R groin vs previous AVF site. S/p  HD catheter removed by vasc surg on 08/31/23. Now has new temp cath on L groin. S/p graft excision 6/18. TEE neg.  - continue vanco as per ID. Repeat blood cultures drawn 6/19, 6/17 NGTD. ID following and recs appreciated  - added flagyl  for clostridium coverage - f/u catheter tip culture, wound culture - minimum 6 week IV Abx with HD  ESRD: on HD MWF.  - Nephro following and recs  - vascular consulted. Temp cath removed 6/17 from R groin. Graft excision 6/18. Placed temp cath on L groin. Permcath placed 6/23. - can discharge with permcath placement- likely tomorrow. Also undergoing HD today - receives midodrine  timed with HD   Hx of CAD & myocardial injury: trop 107, no chest pain.  Likely due to demand ischemia versus decreased clearance of troponin in the setting of ESRD. Continue on aspirin , statin   Left shin wound: present on admission. Wound care consulted   PAD  right AKA. Uses a wheelchair. Continue w/ supportive care - PT/OT- recommending SNF. Patient instead elects to go home with daughter.  -  continue on statin    Chronic a. fib:  - continue on amio, coreg , hold for MAP and/or HR <65.  - Continue on eliquis     DM2: well-controlled glucose on labs. No need for insulin  currently    Chronic diastolic CHF: volume/fluid management w/ HD    Chronic pain syndrome: continue on home dose of percocet    Bicytopenia: w/ likely ACD secondary to ESRD. S/p 1 unit of pRBCs transfuse so far. Repeat H&H are  trending up. Platelets are labile.   DVT prophylaxis: eliquis  Code Status: full  Family Communication: none at bedside Disposition Plan: SNF  Level of care: Telemetry Medical  Status is: Inpatient Remains inpatient appropriate because: vascular access modifications prior to SNF dc  Consultants:  ID Nephro  Vascular surgery   Procedures: HD, temp cath, excision of graft, permcath placement, TEE  Subjective: Pt reports feeling fine today. Post-procedure is sleepy but  alert to voice  Objective: Vitals:   09/05/23 0841 09/05/23 1644 09/05/23 2012 09/06/23 0406  BP: 131/77 127/80 117/70 110/74  Pulse: 65 (!) 110 63 64  Resp: 19 19 20 16   Temp: 98 F (36.7 C) 98.2 F (36.8 C) 98.1 F (36.7 C) 98.7 F (37.1 C)  TempSrc: Oral Oral Oral Oral  SpO2: (!) 82% 99% 92% 97%  Weight:    105.7 kg  Height:        Intake/Output Summary (Last 24 hours) at 09/06/2023 0730 Last data filed at 09/05/2023 1927 Gross per 24 hour  Intake 0 ml  Output --  Net 0 ml   Filed Weights   09/04/23 0400 09/05/23 0451 09/06/23 0406  Weight: 104.1 kg 105.1 kg 105.7 kg   Examination: General exam: appears lethargic  Respiratory system: normal respiratory effort Cardiovascular system: S1 & S2+. No rubs, gallops or clicks.  Gastrointestinal system: Abdomen is nondistended, soft and nontender. Normal bowel sounds heard. Central nervous system: lethargic  Psychiatry: Judgement and insight appears at baseline. Flat mood and affect  Data Reviewed: I have personally reviewed following labs and imaging studies  CBC: Recent Labs  Lab 08/31/23 0347 09/01/23 0416 09/03/23 0417 09/04/23 0437 09/06/23 0323  WBC 5.2 4.5 4.6 4.1 5.0  HGB 8.7* 8.0* 7.7* 8.3* 8.6*  HCT 28.5* 26.4* 24.2* 27.5* 29.0*  MCV 91.9 90.7 92.4 90.5 90.3  PLT 88* 91* 158 211 225   Basic Metabolic Panel: Recent Labs  Lab 08/31/23 0347 09/01/23 0416 09/03/23 0417 09/04/23 0437 09/06/23 0323  NA 133* 133* 135 134* 133*  K 4.3 4.3 4.7 4.2 5.0  CL 95* 94* 94* 97* 97*  CO2 25 27 28 26 23   GLUCOSE 87 100* 142* 102* 102*  BUN 32* 41* 60* 33* 48*  CREATININE 7.18* 9.14* 12.41* 7.94* 11.08*  CALCIUM  8.0* 8.2* 8.1* 8.3* 8.1*  PHOS  --   --  5.0* 3.4 3.7   GFR: Estimated Creatinine Clearance: 9.1 mL/min (A) (by C-G formula based on SCr of 11.08 mg/dL (H)). Liver Function Tests: Recent Labs  Lab 09/03/23 0417 09/04/23 0437 09/06/23 0323  ALBUMIN  2.4* 2.6* 2.4*    LOS: 8 days    [name], MD [facility]  If 7PM-7AM, please contact night-coverage [email or web address] 09/06/2023, 7:30 AM

## 2023-09-06 NOTE — Progress Notes (Signed)
 INFECTIOUS DISEASE PROGRESS NOTE Date of Admission:  08/29/2023     ID: Adam Howell is a 63 y.o. male with  MRSA bacteremia Principal Problem:   Pneumonia of both lungs due to infectious organism Active Problems:   ESRD on dialysis (HCC)   Type 2 diabetes mellitus with ESRD (end-stage renal disease) (HCC)   Chronic pain syndrome   Atrial fibrillation, chronic (HCC)   Hyperlipidemia associated with type 2 diabetes mellitus (HCC)   Aspiration pneumonia (HCC)   Chronic diastolic CHF (congestive heart failure) (HCC)   Pancytopenia (HCC)   Anemia in ESRD (end-stage renal disease) (HCC)   Overweight (BMI 25.0-29.9)   CAD (coronary artery disease)   Myocardial injury   Encounter for dialysis catheter care [facility])   Acute hypoxic respiratory failure (HCC)   Bacteremia   Endocarditis   Subjective: S/p removal of AVG. BC neg. TEE neg  ROS  Eleven systems are reviewed and negative except per hpi  Medications:  Antibiotics Given (last 72 hours)     Date/Time Action Medication Dose Rate   09/03/23 1517 New Bag/Given   [MAR Hold] metroNIDAZOLE  (FLAGYL ) IVPB 500 mg MAR Hold since Mon 09/06/2023 at 0921.Hold Reason: Transfer to a Procedural area 500 mg 100 mL/hr   09/04/23 0521 New Bag/Given   [MAR Hold] metroNIDAZOLE  (FLAGYL ) IVPB 500 mg MAR Hold since Mon 09/06/2023 at 0921.Hold Reason: Transfer to a Procedural area 500 mg 100 mL/hr   09/04/23 1823 New Bag/Given   [MAR Hold] metroNIDAZOLE  (FLAGYL ) IVPB 500 mg MAR Hold since Mon 09/06/2023 at 0921.Hold Reason: Transfer to a Procedural area 500 mg 100 mL/hr   09/05/23 0534 New Bag/Given   [MAR Hold] metroNIDAZOLE  (FLAGYL ) IVPB 500 mg MAR Hold since Mon 09/06/2023 at 0921.Hold Reason: Transfer to a Procedural area 500 mg 100 mL/hr   09/05/23 1845 New Bag/Given   [MAR Hold] metroNIDAZOLE  (FLAGYL ) IVPB 500 mg MAR Hold since Mon 09/06/2023 at 0921.Hold Reason: Transfer to a Procedural area 500 mg 100 mL/hr   09/06/23 0537 New Bag/Given    [MAR Hold] metroNIDAZOLE  (FLAGYL ) IVPB 500 mg MAR Hold since Mon 09/06/2023 at 0921.Hold Reason: Transfer to a Procedural area 500 mg 100 mL/hr       [MAR Hold] amiodarone   200 mg Oral BID   [MAR Hold] apixaban   2.5 mg Oral BID   [MAR Hold] aspirin  EC  81 mg Oral Daily   [MAR Hold] carvedilol   6.25 mg Oral Daily   [MAR Hold] Chlorhexidine  Gluconate Cloth  6 each Topical Q0600   [MAR Hold] epoetin  alfa-epbx (RETACRIT ) injection  10,000 Units Intravenous Q M,W,F-HD   [MAR Hold] iron  polysaccharides  150 mg Oral Daily   [MAR Hold] leptospermum manuka honey  1 Application Topical Daily   [MAR Hold] multivitamin  1 tablet Oral QHS   [MAR Hold] rosuvastatin   40 mg Oral Daily   [MAR Hold] senna  1 tablet Oral Daily   [MAR Hold] sevelamer  carbonate  2,400 mg Oral TID WC   sodium chloride  flush  3-10 mL Intravenous Q12H   [MAR Hold] traZODone   100 mg Oral QHS    Objective: Vital signs in last 24 hours: Temp:  [98.1 F (36.7 C)-98.7 F (37.1 C)] 98.1 F (36.7 C) (06/23 0922) Pulse Rate:  [60-110] 69 (06/23 0922) Resp:  [9-20] 9 (06/23 0922) BP: (110-129)/(64-80) 129/74 (06/23 0922) SpO2:  [92 %-100 %] 100 % (06/23 1105) Weight:  [105.7 kg] 105.7 kg (06/23 0406) Constitutional: He is awake and interactive.  HENT: Mouth/Throat:  Oropharynx is clear and moist. No oropharyngeal exudate.  Cardiovascular: Normal rate, regular rhythm and normal heart sounds. 2/6 sm Pulmonary/Chest: Effort normal and breath sounds normal. No respiratory distress. He has no wheezes.  Abdominal: Soft. Bowel sounds are normal. He exhibits no distension. There is no tenderness.  Lymphadenopathy: He has no cervical adenopathy.  Neurological: [name] and oriented to person, place, and time.  Skin:  L AVF site- covered     Lab Results Recent Labs    09/04/23 0437 09/06/23 0323  WBC 4.1 5.0  HGB 8.3* 8.6*  HCT 27.5* 29.0*  NA 134* 133*  K 4.2 5.0  CL 97* 97*  CO2 26 23  BUN 33* 48*  CREATININE 7.94*  11.08*    Microbiology: Results for orders placed or performed during the hospital encounter of 08/29/23  Resp panel by RT-PCR (RSV, Flu A&B, Covid) Anterior Nasal Swab     Status: None   Collection Time: 08/29/23  8:00 PM   Specimen: Anterior Nasal Swab  Result Value Ref Range Status   SARS Coronavirus 2 by RT PCR NEGATIVE NEGATIVE Final    Comment: (NOTE) SARS-CoV-2 target nucleic acids are NOT DETECTED.  The SARS-CoV-2 RNA is generally detectable in upper respiratory specimens during the acute phase of infection. The lowest concentration of SARS-CoV-2 viral copies this assay can detect is 138 copies/mL. A negative result does not preclude SARS-Cov-2 infection and should not be used as the sole basis for treatment or other patient management decisions. A negative result may occur with  improper specimen collection/handling, submission of specimen other than nasopharyngeal swab, presence of viral mutation(s) within the areas targeted by this assay, and inadequate number of viral copies(<138 copies/mL). A negative result must be combined with clinical observations, patient history, and epidemiological information. The expected result is Negative.  Fact Sheet for Patients:  BloggerCourse.com  Fact Sheet for Healthcare Providers:  SeriousBroker.it  This test is no t yet approved or cleared by the [place]  FDA and  has been authorized for detection and/or diagnosis of SARS-CoV-2 by FDA under an Emergency Use Authorization (EUA). This EUA will remain  in effect (meaning this test can be used) for the duration of the COVID-19 declaration under Section 564(b)(1) of the Act, 21 U.S.C.section 360bbb-3(b)(1), unless the authorization is terminated  or revoked sooner.       Influenza A by PCR NEGATIVE NEGATIVE Final   Influenza B by PCR NEGATIVE NEGATIVE Final    Comment: (NOTE) The Xpert Xpress SARS-CoV-2/FLU/RSV plus assay is  intended as an aid in the diagnosis of influenza from Nasopharyngeal swab specimens and should not be used as a sole basis for treatment. Nasal washings and aspirates are unacceptable for Xpert Xpress SARS-CoV-2/FLU/RSV testing.  Fact Sheet for Patients: BloggerCourse.com  Fact Sheet for Healthcare Providers: SeriousBroker.it  This test is not yet approved or cleared by the [place]  FDA and has been authorized for detection and/or diagnosis of SARS-CoV-2 by FDA under an Emergency Use Authorization (EUA). This EUA will remain in effect (meaning this test can be used) for the duration of the COVID-19 declaration under Section 564(b)(1) of the Act, 21 U.S.C. section 360bbb-3(b)(1), unless the authorization is terminated or revoked.     Resp Syncytial Virus by PCR NEGATIVE NEGATIVE Final    Comment: (NOTE) Fact Sheet for Patients: BloggerCourse.com  Fact Sheet for Healthcare Providers: SeriousBroker.it  This test is not yet approved or cleared by the [place]  FDA and has been authorized for detection  and/or diagnosis of SARS-CoV-2 by FDA under an Emergency Use Authorization (EUA). This EUA will remain in effect (meaning this test can be used) for the duration of the COVID-19 declaration under Section 564(b)(1) of the Act, 21 U.S.C. section 360bbb-3(b)(1), unless the authorization is terminated or revoked.  Performed at [facility], [address] [place], [place] [address]   Blood Culture (routine x 2)     Status: Abnormal   Collection Time: 08/30/23 12:06 AM   Specimen: BLOOD RIGHT HAND  Result Value Ref Range Status   Specimen Description   Final    BLOOD RIGHT HAND Performed at [facility], [address], [place], [place] [address]    Special Requests   Final    BOTTLES DRAWN AEROBIC AND ANAEROBIC Blood Culture results may not be optimal  due to an inadequate volume of blood received in culture bottles Performed at [facility], [address], [place], [place] [address]    Culture  Setup Time   Final    GRAM POSITIVE COCCI IN BOTH AEROBIC AND ANAEROBIC BOTTLES CRITICAL RESULT CALLED TO, READ BACK BY AND VERIFIED WITH: MOSE BLEW 08/30/23 1230 MW Performed at [facility], [address] [place], [place] [address]    Culture (A)  Final    STAPHYLOCOCCUS AUREUS SUSCEPTIBILITIES PERFORMED ON PREVIOUS CULTURE WITHIN THE LAST 5 DAYS. Performed at [facility], [address] [place], [place] [address]    Report Status 09/01/2023 FINAL  Final  Blood Culture (routine x 2)     Status: Abnormal   Collection Time: 08/30/23 12:06 AM   Specimen: BLOOD RIGHT ARM  Result Value Ref Range Status   Specimen Description   Final    BLOOD RIGHT ARM Performed at [facility], [address] [place], [place] [address]    Special Requests   Final    BOTTLES DRAWN AEROBIC AND ANAEROBIC Blood Culture results may not be optimal due to an inadequate volume of blood received in culture bottles Performed at [facility], [address] [place], [place] [address]    Culture  Setup Time   Final    GRAM POSITIVE COCCI IN BOTH AEROBIC AND ANAEROBIC BOTTLES CRITICAL RESULT CALLED TO, READ BACK BY AND VERIFIED WITH: TREY GREENWOOD 08/30/23 1230 MW GRAM POSITIVE RODS ANAEROBIC BOTTLE ONLY CRITICAL RESULT CALLED TO, READ BACK BY AND VERIFIED WITH: PHARMD W. ANDERSON [id number] @1700  FH Performed at [facility], [address], [place], [place] [address]    Culture (A)  Final    METHICILLIN RESISTANT STAPHYLOCOCCUS AUREUS CLOSTRIDIUM PERFRINGENS    Report Status 09/01/2023 FINAL  Final   Organism ID, Bacteria METHICILLIN RESISTANT STAPHYLOCOCCUS AUREUS  Final      Susceptibility   Methicillin resistant staphylococcus aureus - MIC*    CIPROFLOXACIN >=8 RESISTANT Resistant     ERYTHROMYCIN  >=8 RESISTANT Resistant     GENTAMICIN <=0.5 SENSITIVE Sensitive     OXACILLIN >=4 RESISTANT Resistant     TETRACYCLINE >=16 RESISTANT Resistant     VANCOMYCIN  1 SENSITIVE Sensitive     TRIMETH /SULFA  <=10 SENSITIVE Sensitive     CLINDAMYCIN >=8 RESISTANT Resistant     RIFAMPIN <=0.5 SENSITIVE Sensitive     Inducible Clindamycin NEGATIVE Sensitive     LINEZOLID  2 SENSITIVE Sensitive     * METHICILLIN RESISTANT STAPHYLOCOCCUS AUREUS  Blood Culture ID Panel (Reflexed)     Status: Abnormal   Collection Time: 08/30/23 12:06 AM  Result  Value Ref Range Status   Enterococcus faecalis NOT DETECTED NOT DETECTED Final   Enterococcus Faecium NOT DETECTED NOT DETECTED Final   Listeria monocytogenes NOT DETECTED NOT DETECTED Final   Staphylococcus species DETECTED (A) NOT DETECTED Final    Comment: CRITICAL RESULT CALLED TO, READ BACK BY AND VERIFIED WITH: TREY GREENWOOD 08/30/23 1230 MW    Staphylococcus aureus (BCID) DETECTED (A) NOT DETECTED Final    Comment: Methicillin (oxacillin)-resistant Staphylococcus aureus (MRSA). MRSA is predictably resistant to beta-lactam antibiotics (except ceftaroline). Preferred therapy is vancomycin  unless clinically contraindicated. Patient requires contact precautions if  hospitalized. CRITICAL RESULT CALLED TO, READ BACK BY AND VERIFIED WITH: TREY GREENWOOD 08/30/23 1230 MW    Staphylococcus epidermidis NOT DETECTED NOT DETECTED Final   Staphylococcus lugdunensis NOT DETECTED NOT DETECTED Final   Streptococcus species NOT DETECTED NOT DETECTED Final   Streptococcus agalactiae NOT DETECTED NOT DETECTED Final   Streptococcus pneumoniae NOT DETECTED NOT DETECTED Final   Streptococcus pyogenes NOT DETECTED NOT DETECTED Final   A.calcoaceticus-baumannii NOT DETECTED NOT DETECTED Final   Bacteroides fragilis NOT DETECTED NOT DETECTED Final   Enterobacterales NOT DETECTED NOT DETECTED Final   Enterobacter cloacae complex NOT DETECTED NOT DETECTED Final    Escherichia coli NOT DETECTED NOT DETECTED Final   Klebsiella aerogenes NOT DETECTED NOT DETECTED Final   Klebsiella oxytoca NOT DETECTED NOT DETECTED Final   Klebsiella pneumoniae NOT DETECTED NOT DETECTED Final   Proteus species NOT DETECTED NOT DETECTED Final   Salmonella species NOT DETECTED NOT DETECTED Final   Serratia marcescens NOT DETECTED NOT DETECTED Final   Haemophilus influenzae NOT DETECTED NOT DETECTED Final   Neisseria meningitidis NOT DETECTED NOT DETECTED Final   Pseudomonas aeruginosa NOT DETECTED NOT DETECTED Final   Stenotrophomonas maltophilia NOT DETECTED NOT DETECTED Final   Candida albicans NOT DETECTED NOT DETECTED Final   Candida auris NOT DETECTED NOT DETECTED Final   Candida glabrata NOT DETECTED NOT DETECTED Final   Candida krusei NOT DETECTED NOT DETECTED Final   Candida parapsilosis NOT DETECTED NOT DETECTED Final   Candida tropicalis NOT DETECTED NOT DETECTED Final   Cryptococcus neoformans/gattii NOT DETECTED NOT DETECTED Final   Meth resistant mecA/C and MREJ DETECTED (A) NOT DETECTED Final    Comment: CRITICAL RESULT CALLED TO, READ BACK BY AND VERIFIED WITH: MOSE BLEW 08/30/23 1230 MW Performed at [facility], [address] [place], [place] [address]   MRSA Next Gen by PCR, Nasal     Status: Abnormal   Collection Time: 08/30/23  2:53 PM   Specimen: Nasal Mucosa; Nasal Swab  Result Value Ref Range Status   MRSA by PCR Next Gen DETECTED (A) NOT DETECTED Final    Comment: CRITICAL RESULT CALLED TO, READ BACK BY AND VERIFIED WITH:  ZADA GARRISON RN 08/30/2023 @1624  KKG (NOTE) The GeneXpert MRSA Assay (FDA approved for NASAL specimens only), is one component of a comprehensive MRSA colonization surveillance program. It is not intended to diagnose MRSA infection nor to guide or monitor treatment for MRSA infections. Test performance is not FDA approved in patients less than 86 years old. Performed at [facility], [address] [place], [place] [address]   Cath Tip Culture     Status: None   Collection Time: 08/31/23  8:50 AM   Specimen: Catheter Tip; Other  Result Value Ref Range Status   Specimen Description   Final    CATH TIP Performed at [facility], [address] [place], [place] [address]  Special Requests   Final    NONE Performed at [facility], [address] [place], [place] [address]    Culture   Final    NO GROWTH 2 DAYS Performed at [facility], [address] [place], [place] [address]    Report Status 09/02/2023 FINAL  Final  Culture, blood (single) w Reflex to ID Panel     Status: None   Collection Time: 08/31/23 10:12 AM   Specimen: BLOOD  Result Value Ref Range Status   Specimen Description BLOOD RIGHT FA  Final   Special Requests   Final    BOTTLES DRAWN AEROBIC AND ANAEROBIC Blood Culture adequate volume   Culture   Final    NO GROWTH 5 DAYS Performed at [facility], [address], [place], [place] [address]    Report Status 09/05/2023 FINAL  Final  Aerobic/Anaerobic Culture w Gram Stain (surgical/deep wound)     Status: None   Collection Time: 09/01/23 12:21 PM   Specimen: Path Tissue  Result Value Ref Range Status   Specimen Description   Final    TISSUE Performed at [facility], [address], [place], [place] [address]    Special Requests   Final    NONE Performed at [facility], [address] [place], [place] [address]    Gram Stain NO WBC SEEN NO ORGANISMS SEEN   Final   Culture   Final    FEW METHICILLIN RESISTANT STAPHYLOCOCCUS AUREUS NO ANAEROBES ISOLATED Performed at [facility], [address] [place], [place] [address]    Report Status 09/06/2023 FINAL  Final   Organism ID, Bacteria METHICILLIN RESISTANT STAPHYLOCOCCUS AUREUS  Final      Susceptibility   Methicillin resistant staphylococcus aureus - MIC*    CIPROFLOXACIN >=8 RESISTANT Resistant     ERYTHROMYCIN >=8  RESISTANT Resistant     GENTAMICIN <=0.5 SENSITIVE Sensitive     OXACILLIN >=4 RESISTANT Resistant     TETRACYCLINE >=16 RESISTANT Resistant     VANCOMYCIN  1 SENSITIVE Sensitive     TRIMETH /SULFA  <=10 SENSITIVE Sensitive     CLINDAMYCIN >=8 RESISTANT Resistant     RIFAMPIN <=0.5 SENSITIVE Sensitive     Inducible Clindamycin NEGATIVE Sensitive     LINEZOLID  2 SENSITIVE Sensitive     * FEW METHICILLIN RESISTANT STAPHYLOCOCCUS AUREUS  Culture, blood (Routine X 2) w Reflex to ID Panel     Status: None (Preliminary result)   Collection Time: 09/02/23  8:08 AM   Specimen: BLOOD  Result Value Ref Range Status   Specimen Description BLOOD RIGHT ANTECUBITAL  Final   Special Requests   Final    BOTTLES DRAWN AEROBIC AND ANAEROBIC Blood Culture adequate volume   Culture   Final    NO GROWTH 4 DAYS Performed at [facility], [address], [place], [place] [address]    Report Status PENDING  Incomplete  Culture, blood (Routine X 2) w Reflex to ID Panel     Status: None (Preliminary result)   Collection Time: 09/02/23  8:08 AM   Specimen: BLOOD  Result Value Ref Range Status   Specimen Description BLOOD BLOOD RIGHT FOREARM  Final   Special Requests   Final    BOTTLES DRAWN AEROBIC AND ANAEROBIC Blood Culture adequate volume   Culture   Final    NO GROWTH 4 DAYS Performed at [facility], [address] [place], [place] [address]    Report Status  PENDING  Incomplete  Expectorated Sputum Assessment w Gram Stain, Rflx to Resp Cult     Status: None   Collection Time: 09/04/23  3:13 AM   Specimen: Expectorated Sputum  Result Value Ref Range Status   Specimen Description EXPECTORATED SPUTUM  Final   Special Requests NONE  Final   Sputum evaluation   Final    Sputum specimen not acceptable for testing.  Please recollect.   RESULT CALLED TO, READ BACK BY AND VERIFIED WITH: MARCELITA WINT @ 09/04/23 0448 AB Performed at [facility], [address]  [place], [place] [address]    Report Status 09/04/2023 FINAL  Final    Studies/Results: No results found.   Assessment/Plan: Harlin Mazzoni is a 63 y.o. male with ESRD on HD through R groin permacath, s.p recent hospitalization with L AVF revision admitted with SOB and found to have bil Ground glass nodular disease and MRSA bacteremia 6/17- fu bcx today ngtd. Fem HD cath  removed 6/17    6/23- s/p resection of infected AVG LUE. FU BCX remain negative 6/17 and 6/19. Cx from graft site at surgery + MRSA. TEE neg for veg One bcx 6/16 also grew clostridium perfringens so on flagyl  as well    Recommendations MRSA bacteremia and L AVG site infection- Had a permacath R groin - removed 6/17.  Had infection at LUE AVG site so that was removed on 6/18 TEE negative Will plan 4 week course from resection of AV graft material done 6/18  Clostridium perfringens bacteremia- mixed with the MRSA - likely from AVG site infection Day 8 of anaerobic coverage- will plan 2 week course.of oral flagyl     Thank you very much for the consult. Will follow with you.  Alm SHAUNNA Needle   09/06/2023, 11:35 AM

## 2023-09-06 NOTE — Progress Notes (Signed)
 PT Cancellation Note  Patient Details Name: Adam Howell MRN: [id number] DOB: 22-Apr-1960   Cancelled Treatment:    Reason Eval/Treat Not Completed: Patient at procedure or test/unavailable.  Pt currently off unit for procedure.  Will re-attempt PT session at a later date/time.  Damien Caulk, PT 09/06/23, 10:05 AM

## 2023-09-06 NOTE — Progress Notes (Signed)
 [facility]  ROUNDING NOTE   Subjective:   Adam Howell is a 63 y.o male with past medical history of diabetes, hypertension, CAD, Rt AKA, anemia, CHF, and ESRD on dialysis. Patient presents to vascular surgery for revision of AVF. He is being admitted for Aspiration pneumonia (HCC) [J69.0] HCAP (healthcare-associated pneumonia) [J18.9] Pneumonia of both lungs due to infectious organism, unspecified part of lung [J18.9] Acute hypoxic respiratory failure (HCC) [J96.01]  Patient presented to the hospital for shortness of breath.  He was diagnosed with pneumonia and is being admitted for further management.  Blood cultures are positive for staph species and MRSA in addition to gram-positive rods.  Femoral tunneled dialysis catheter was removed 6-17/25.  Update: Patient due for hemodialysis treatment today. Also due for PermCath placement today. Resting in bed comfortably.   Objective:  Vital signs in last 24 hours:  Temp:  [98.1 F (36.7 C)-98.7 F (37.1 C)] 98.1 F (36.7 C) (06/23 0922) Pulse Rate:  [60-110] 69 (06/23 0922) Resp:  [9-20] 9 (06/23 0922) BP: (110-129)/(64-80) 129/74 (06/23 0922) SpO2:  [92 %-99 %] 99 % (06/23 0922) Weight:  [105.7 kg] 105.7 kg (06/23 0406)  Weight change: 0.6 kg Filed Weights   09/04/23 0400 09/05/23 0451 09/06/23 0406  Weight: 104.1 kg 105.1 kg 105.7 kg    Intake/Output: No intake/output data recorded.   Intake/Output this shift:  No intake/output data recorded.  Physical Exam: General: No acute distress  Head: [place]/AT, hearing intact  Eyes: Anicteric  Lungs:  Bilateral coarse breath sounds  Heart: Regular rate and rhythm  Abdomen:  Soft, nontender, obese  Extremities: No peripheral edema  Right AKA  Neurologic: Awake, alert  Skin: No lesions  Access: Jump graft removed, left femoral temporary dialysis catheter placed    Basic Metabolic Panel: Recent Labs  Lab 08/31/23 0347 09/01/23 0416 09/03/23 0417  09/04/23 0437 09/06/23 0323  NA 133* 133* 135 134* 133*  K 4.3 4.3 4.7 4.2 5.0  CL 95* 94* 94* 97* 97*  CO2 25 27 28 26 23   GLUCOSE 87 100* 142* 102* 102*  BUN 32* 41* 60* 33* 48*  CREATININE 7.18* 9.14* 12.41* 7.94* 11.08*  CALCIUM  8.0* 8.2* 8.1* 8.3* 8.1*  PHOS  --   --  5.0* 3.4 3.7    Liver Function Tests: Recent Labs  Lab 09/03/23 0417 09/04/23 0437 09/06/23 0323  ALBUMIN  2.4* 2.6* 2.4*   No results for input(s): LIPASE, AMYLASE in the last 168 hours. No results for input(s): AMMONIA in the last 168 hours.  CBC: Recent Labs  Lab 08/31/23 0347 09/01/23 0416 09/03/23 0417 09/04/23 0437 09/06/23 0323  WBC 5.2 4.5 4.6 4.1 5.0  HGB 8.7* 8.0* 7.7* 8.3* 8.6*  HCT 28.5* 26.4* 24.2* 27.5* 29.0*  MCV 91.9 90.7 92.4 90.5 90.3  PLT 88* 91* 158 211 225    Cardiac Enzymes: No results for input(s): CKTOTAL, CKMB, CKMBINDEX, TROPONINI in the last 168 hours.  BNP: Invalid input(s): POCBNP  CBG: Recent Labs  Lab 09/01/23 1326  GLUCAP 96     Microbiology: Results for orders placed or performed during the hospital encounter of 08/29/23  Resp panel by RT-PCR (RSV, Flu A&B, Covid) Anterior Nasal Swab     Status: None   Collection Time: 08/29/23  8:00 PM   Specimen: Anterior Nasal Swab  Result Value Ref Range Status   SARS Coronavirus 2 by RT PCR NEGATIVE NEGATIVE Final    Comment: (NOTE) SARS-CoV-2 target nucleic acids are NOT DETECTED.  The  SARS-CoV-2 RNA is generally detectable in upper respiratory specimens during the acute phase of infection. The lowest concentration of SARS-CoV-2 viral copies this assay can detect is 138 copies/mL. A negative result does not preclude SARS-Cov-2 infection and should not be used as the sole basis for treatment or other patient management decisions. A negative result may occur with  improper specimen collection/handling, submission of specimen other than nasopharyngeal swab, presence of viral mutation(s) within  the areas targeted by this assay, and inadequate number of viral copies(<138 copies/mL). A negative result must be combined with clinical observations, patient history, and epidemiological information. The expected result is Negative.  Fact Sheet for Patients:  BloggerCourse.com  Fact Sheet for Healthcare Providers:  SeriousBroker.it  This test is no t yet approved or cleared by the [place]  FDA and  has been authorized for detection and/or diagnosis of SARS-CoV-2 by FDA under an Emergency Use Authorization (EUA). This EUA will remain  in effect (meaning this test can be used) for the duration of the COVID-19 declaration under Section 564(b)(1) of the Act, 21 U.S.C.section 360bbb-3(b)(1), unless the authorization is terminated  or revoked sooner.       Influenza A by PCR NEGATIVE NEGATIVE Final   Influenza B by PCR NEGATIVE NEGATIVE Final    Comment: (NOTE) The Xpert Xpress SARS-CoV-2/FLU/RSV plus assay is intended as an aid in the diagnosis of influenza from Nasopharyngeal swab specimens and should not be used as a sole basis for treatment. Nasal washings and aspirates are unacceptable for Xpert Xpress SARS-CoV-2/FLU/RSV testing.  Fact Sheet for Patients: BloggerCourse.com  Fact Sheet for Healthcare Providers: SeriousBroker.it  This test is not yet approved or cleared by the [place]  FDA and has been authorized for detection and/or diagnosis of SARS-CoV-2 by FDA under an Emergency Use Authorization (EUA). This EUA will remain in effect (meaning this test can be used) for the duration of the COVID-19 declaration under Section 564(b)(1) of the Act, 21 U.S.C. section 360bbb-3(b)(1), unless the authorization is terminated or revoked.     Resp Syncytial Virus by PCR NEGATIVE NEGATIVE Final    Comment: (NOTE) Fact Sheet for  Patients: BloggerCourse.com  Fact Sheet for Healthcare Providers: SeriousBroker.it  This test is not yet approved or cleared by the [place]  FDA and has been authorized for detection and/or diagnosis of SARS-CoV-2 by FDA under an Emergency Use Authorization (EUA). This EUA will remain in effect (meaning this test can be used) for the duration of the COVID-19 declaration under Section 564(b)(1) of the Act, 21 U.S.C. section 360bbb-3(b)(1), unless the authorization is terminated or revoked.  Performed at [facility], [address] [place], [place] [address]   Blood Culture (routine x 2)     Status: Abnormal   Collection Time: 08/30/23 12:06 AM   Specimen: BLOOD RIGHT HAND  Result Value Ref Range Status   Specimen Description   Final    BLOOD RIGHT HAND Performed at [facility], [address] [place], [place] [address]    Special Requests   Final    BOTTLES DRAWN AEROBIC AND ANAEROBIC Blood Culture results may not be optimal due to an inadequate volume of blood received in culture bottles Performed at [facility], [address], [place], [place] [address]    Culture  Setup Time   Final    GRAM POSITIVE COCCI IN BOTH AEROBIC AND ANAEROBIC BOTTLES CRITICAL RESULT CALLED TO, READ BACK BY AND VERIFIED WITH: MOSE BLEW 08/30/23 1230 MW Performed at [facility], [address] [place], [place] [address]    Culture (A)  Final    STAPHYLOCOCCUS AUREUS SUSCEPTIBILITIES PERFORMED ON PREVIOUS CULTURE WITHIN THE LAST 5 DAYS. Performed at [facility], [address], [place], [place] [address]    Report Status 09/01/2023 FINAL  Final  Blood Culture (routine x 2)     Status: Abnormal   Collection Time: 08/30/23 12:06 AM   Specimen: BLOOD RIGHT ARM  Result Value Ref Range Status   Specimen Description   Final    BLOOD RIGHT ARM Performed at [facility], [address], [place], [place] [address]    Special Requests   Final    BOTTLES DRAWN AEROBIC AND ANAEROBIC Blood Culture results may not be optimal due to an inadequate volume of blood received in culture bottles Performed at [facility], [address] [place], [place] [address]    Culture  Setup Time   Final    GRAM POSITIVE COCCI IN BOTH AEROBIC AND ANAEROBIC BOTTLES CRITICAL RESULT CALLED TO, READ BACK BY AND VERIFIED WITH: TREY GREENWOOD 08/30/23 1230 MW GRAM POSITIVE RODS ANAEROBIC BOTTLE ONLY CRITICAL RESULT CALLED TO, READ BACK BY AND VERIFIED WITH: PHARMD W. ANDERSON [id number] @1700  FH Performed at [facility], [address] [place], [place] [address]    Culture (A)  Final    METHICILLIN RESISTANT STAPHYLOCOCCUS AUREUS CLOSTRIDIUM PERFRINGENS    Report Status 09/01/2023 FINAL  Final   Organism ID, Bacteria METHICILLIN RESISTANT STAPHYLOCOCCUS AUREUS  Final      Susceptibility   Methicillin resistant staphylococcus aureus - MIC*    CIPROFLOXACIN >=8 RESISTANT Resistant     ERYTHROMYCIN >=8 RESISTANT Resistant     GENTAMICIN <=0.5 SENSITIVE Sensitive     OXACILLIN >=4 RESISTANT Resistant     TETRACYCLINE >=16 RESISTANT Resistant     VANCOMYCIN  1 SENSITIVE Sensitive     TRIMETH /SULFA  <=10 SENSITIVE Sensitive     CLINDAMYCIN >=8 RESISTANT Resistant     RIFAMPIN <=0.5 SENSITIVE Sensitive     Inducible Clindamycin NEGATIVE Sensitive     LINEZOLID  2 SENSITIVE Sensitive     * METHICILLIN RESISTANT STAPHYLOCOCCUS AUREUS  Blood Culture ID Panel (Reflexed)     Status: Abnormal   Collection Time: 08/30/23 12:06 AM  Result Value Ref Range Status   Enterococcus faecalis NOT DETECTED NOT DETECTED Final   Enterococcus Faecium NOT DETECTED NOT DETECTED Final   Listeria monocytogenes NOT DETECTED NOT DETECTED Final   Staphylococcus species DETECTED (A) NOT DETECTED Final    Comment: CRITICAL RESULT CALLED TO, READ BACK BY AND VERIFIED WITH: TREY GREENWOOD 08/30/23 1230 MW     Staphylococcus aureus (BCID) DETECTED (A) NOT DETECTED Final    Comment: Methicillin (oxacillin)-resistant Staphylococcus aureus (MRSA). MRSA is predictably resistant to beta-lactam antibiotics (except ceftaroline). Preferred therapy is vancomycin  unless clinically contraindicated. Patient requires contact precautions if  hospitalized. CRITICAL RESULT CALLED TO, READ BACK BY AND VERIFIED WITH: TREY GREENWOOD 08/30/23 1230 MW    Staphylococcus epidermidis NOT DETECTED NOT DETECTED Final   Staphylococcus lugdunensis NOT DETECTED NOT DETECTED Final   Streptococcus species NOT DETECTED NOT DETECTED Final   Streptococcus agalactiae NOT DETECTED NOT DETECTED Final   Streptococcus pneumoniae NOT DETECTED NOT DETECTED Final   Streptococcus pyogenes NOT DETECTED NOT DETECTED Final   A.calcoaceticus-baumannii NOT DETECTED NOT DETECTED Final   Bacteroides fragilis NOT DETECTED NOT DETECTED Final   Enterobacterales NOT DETECTED NOT DETECTED Final   Enterobacter cloacae complex NOT DETECTED NOT  DETECTED Final   Escherichia coli NOT DETECTED NOT DETECTED Final   Klebsiella aerogenes NOT DETECTED NOT DETECTED Final   Klebsiella oxytoca NOT DETECTED NOT DETECTED Final   Klebsiella pneumoniae NOT DETECTED NOT DETECTED Final   Proteus species NOT DETECTED NOT DETECTED Final   Salmonella species NOT DETECTED NOT DETECTED Final   Serratia marcescens NOT DETECTED NOT DETECTED Final   Haemophilus influenzae NOT DETECTED NOT DETECTED Final   Neisseria meningitidis NOT DETECTED NOT DETECTED Final   Pseudomonas aeruginosa NOT DETECTED NOT DETECTED Final   Stenotrophomonas maltophilia NOT DETECTED NOT DETECTED Final   Candida albicans NOT DETECTED NOT DETECTED Final   Candida auris NOT DETECTED NOT DETECTED Final   Candida glabrata NOT DETECTED NOT DETECTED Final   Candida krusei NOT DETECTED NOT DETECTED Final   Candida parapsilosis NOT DETECTED NOT DETECTED Final   Candida tropicalis NOT DETECTED NOT  DETECTED Final   Cryptococcus neoformans/gattii NOT DETECTED NOT DETECTED Final   Meth resistant mecA/C and MREJ DETECTED (A) NOT DETECTED Final    Comment: CRITICAL RESULT CALLED TO, READ BACK BY AND VERIFIED WITH: MOSE BLEW 08/30/23 1230 MW Performed at [facility], [address] [place], [place] [address]   MRSA Next Gen by PCR, Nasal     Status: Abnormal   Collection Time: 08/30/23  2:53 PM   Specimen: Nasal Mucosa; Nasal Swab  Result Value Ref Range Status   MRSA by PCR Next Gen DETECTED (A) NOT DETECTED Final    Comment: CRITICAL RESULT CALLED TO, READ BACK BY AND VERIFIED WITH:  ZADA GARRISON RN 08/30/2023 @1624  KKG (NOTE) The GeneXpert MRSA Assay (FDA approved for NASAL specimens only), is one component of a comprehensive MRSA colonization surveillance program. It is not intended to diagnose MRSA infection nor to guide or monitor treatment for MRSA infections. Test performance is not FDA approved in patients less than 52 years old. Performed at [facility], [address], [place], [place] [address]   Cath Tip Culture     Status: None   Collection Time: 08/31/23  8:50 AM   Specimen: Catheter Tip; Other  Result Value Ref Range Status   Specimen Description   Final    CATH TIP Performed at [facility] [address], [place], [place] [address]    Special Requests   Final    NONE Performed at [facility], [address], [place], [place] [address]    Culture   Final    NO GROWTH 2 DAYS Performed at [facility], [address] [place], [place] [address]    Report Status 09/02/2023 FINAL  Final  Culture, blood (single) w Reflex to ID Panel     Status: None   Collection Time: 08/31/23 10:12 AM   Specimen: BLOOD  Result Value Ref Range Status   Specimen Description BLOOD RIGHT FA  Final   Special Requests   Final    BOTTLES DRAWN AEROBIC AND ANAEROBIC Blood Culture adequate volume   Culture   Final    NO GROWTH 5  DAYS Performed at [facility], [address], [place], [place] [address]    Report Status 09/05/2023 FINAL  Final  Aerobic/Anaerobic Culture w Gram Stain (surgical/deep wound)     Status: None (Preliminary result)   Collection Time: 09/01/23 12:21 PM   Specimen: Path Tissue  Result Value Ref Range Status   Specimen Description   Final    TISSUE Performed at [facility], [address] [place], [place] [address]    Special Requests   Final    NONE Performed at [facility], [address] [place], [place] [address]    Gram Stain   Final    NO WBC SEEN NO ORGANISMS SEEN Performed at [facility], [address], [place], [place] [address]    Culture   Final    FEW METHICILLIN RESISTANT STAPHYLOCOCCUS AUREUS NO ANAEROBES ISOLATED; CULTURE IN PROGRESS FOR 5 DAYS    Report Status PENDING  Incomplete   Organism ID, Bacteria METHICILLIN RESISTANT STAPHYLOCOCCUS AUREUS  Final      Susceptibility   Methicillin resistant staphylococcus aureus - MIC*    CIPROFLOXACIN >=8 RESISTANT Resistant     ERYTHROMYCIN >=8 RESISTANT Resistant     GENTAMICIN <=0.5 SENSITIVE Sensitive     OXACILLIN >=4 RESISTANT Resistant     TETRACYCLINE >=16 RESISTANT Resistant     VANCOMYCIN  1 SENSITIVE Sensitive     TRIMETH /SULFA  <=10 SENSITIVE Sensitive     CLINDAMYCIN >=8 RESISTANT Resistant     RIFAMPIN <=0.5 SENSITIVE Sensitive     Inducible Clindamycin NEGATIVE Sensitive     LINEZOLID  2 SENSITIVE Sensitive     * FEW METHICILLIN RESISTANT STAPHYLOCOCCUS AUREUS  Culture, blood (Routine X 2) w Reflex to ID Panel     Status: None (Preliminary result)   Collection Time: 09/02/23  8:08 AM   Specimen: BLOOD  Result Value Ref Range Status   Specimen Description BLOOD RIGHT ANTECUBITAL  Final   Special Requests   Final    BOTTLES DRAWN AEROBIC AND ANAEROBIC Blood Culture adequate volume   Culture   Final    NO GROWTH 4 DAYS Performed at [facility], [address] [place], [place] [address]    Report Status PENDING  Incomplete  Culture, blood (Routine X 2) w Reflex to ID Panel     Status: None (Preliminary result)   Collection Time: 09/02/23  8:08 AM   Specimen: BLOOD  Result Value Ref Range Status   Specimen Description BLOOD BLOOD RIGHT FOREARM  Final   Special Requests   Final    BOTTLES DRAWN AEROBIC AND ANAEROBIC Blood Culture adequate volume   Culture   Final    NO GROWTH 4 DAYS Performed at [facility], [address] [place], [place] [address]    Report Status PENDING  Incomplete  Expectorated Sputum Assessment w Gram Stain, Rflx to Resp Cult     Status: None   Collection Time: 09/04/23  3:13 AM   Specimen: Expectorated Sputum  Result Value Ref Range Status   Specimen Description EXPECTORATED SPUTUM  Final   Special Requests NONE  Final   Sputum evaluation   Final    Sputum specimen not acceptable for testing.  Please recollect.   RESULT CALLED TO, READ BACK BY AND VERIFIED WITH: MARCELITA WINT @ 09/04/23 0448 AB Performed at [facility], [address] [place], [place] [address]    Report Status 09/04/2023 FINAL  Final    Coagulation Studies: No results for input(s): LABPROT, INR in the last 72 hours.   Urinalysis: No results for input(s): COLORURINE, LABSPEC, PHURINE, GLUCOSEU, HGBUR, BILIRUBINUR, KETONESUR, PROTEINUR, UROBILINOGEN, NITRITE, LEUKOCYTESUR in the last 72 hours.  Invalid input(s): APPERANCEUR    Imaging: No results found.     Medications:    sodium chloride  10 mL/hr at 09/06/23 0929   [facility] Hold] metronidazole  500 mg (09/06/23 0537)   [MAR Hold] vancomycin        [  MAR Hold] amiodarone   200 mg Oral BID   [MAR Hold] apixaban   2.5 mg Oral BID   [MAR Hold] aspirin  EC  81 mg Oral Daily   [MAR Hold] carvedilol   6.25 mg Oral Daily   [MAR Hold] Chlorhexidine  Gluconate Cloth  6 each Topical Q0600   [MAR Hold] epoetin  alfa-epbx (RETACRIT )  injection  10,000 Units Intravenous Q M,W,F-HD   [MAR Hold] iron  polysaccharides  150 mg Oral Daily   [MAR Hold] leptospermum manuka honey  1 Application Topical Daily   [MAR Hold] multivitamin  1 tablet Oral QHS   [MAR Hold] rosuvastatin   40 mg Oral Daily   [MAR Hold] senna  1 tablet Oral Daily   [MAR Hold] sevelamer  carbonate  2,400 mg Oral TID WC   sodium chloride  flush  3-10 mL Intravenous Q12H   [MAR Hold] traZODone   100 mg Oral QHS   [MAR Hold] acetaminophen , [MAR Hold] albuterol , [MAR Hold] dextromethorphan -guaiFENesin , [MAR Hold] feeding supplement (NEPRO CARB STEADY), [MAR Hold] lidocaine  (PF), [MAR Hold] lidocaine -prilocaine , [MAR Hold] melatonin, [MAR Hold] midodrine , [MAR Hold] ondansetron  (ZOFRAN ) IV, [MAR Hold] oxyCODONE -acetaminophen , [MAR Hold] pentafluoroprop-tetrafluoroeth, sodium chloride  flush  Assessment/ Plan:  Mr. Stepan Verrette is a 63 y.o.  male with end stage renal disease on hemodialysis, diabetes, hypertension, CAD, Rt AKA, anemia, CHF who was admitted to [facility] on 08/29/2023 for revision of AVF. Patient had anaphylactic reaction and admitted to ICU.   [facility]/MWF/left AVG removed  End-stage renal disease with hyperkalemia on hemodialysis.   - Jump graft removed 09/01/2023.   - Patient due for hemodialysis treatment again today.  It appears that PermCath will also be placed today.  2. Anemia of chronic kidney disease Lab Results  Component Value Date   HGB 8.6 (L) 09/06/2023   Hemoglobin below target at 8.6.  Maintain the patient Epogen  10,000's IV with dialysis.  3. Secondary Hyperparathyroidism: with outpatient labs:     Lab Results  Component Value Date   CALCIUM  8.1 (L) 09/06/2023   CAION 1.06 (L) 08/05/2023   PHOS 3.7 09/06/2023  Phosphorus at target 3.7.  Continue current dosage of Renvela .  4. Pneumonia, sepsis and shortness of breath - Blood cultures (6/16) positive for MRSA and gram-positive rods  - iv Abx as per hospitalist  team     LOS: 8 Tracina Beaumont 6/23/202510:00 AM

## 2023-09-06 NOTE — Progress Notes (Signed)
 Hemodialysis Note:  Received patient in bed to unit. Alert and oriented. Informed consent singed and in chart.  Treatment initiated: 1334 Treatment completed: 1739  Access used: Left internal jugular catheter Access issues: None  Patient tolerated well. Transported back to room, alert without acute distress. Report given to patient's RN.  Total UF removed: 2 liters Medications given: Retacrit  10000 units IV, Vancomycin  1 gm IV  Post HD weight: 101.5 Kg  [facility]

## 2023-09-06 NOTE — Op Note (Signed)
 OPERATIVE NOTE    PRE-OPERATIVE DIAGNOSIS: 1. ESRD   POST-OPERATIVE DIAGNOSIS: same as above  PROCEDURE: Ultrasound guidance for vascular access to the left internal jugular vein Fluoroscopic guidance for placement of catheter Placement of a 23 cm tip to cuff tunneled hemodialysis catheter via the left internal jugular vein  SURGEON: Selinda Gu, MD  ANESTHESIA:  Local with Moderate conscious sedation for approximately 27 minutes using 2 mg of Versed  and 50 mcg of Fentanyl   ESTIMATED BLOOD LOSS: 5 cc  FLUORO TIME: less than one minute  CONTRAST: none  FINDING(S): 1.  Patent left internal jugular vein  SPECIMEN(S):  None  INDICATIONS:   Adam Howell is a 63 y.o. male who presents with renal failure and his previous permcath was removed for infection.  He now has negative cultures.  The patient needs long term dialysis access for their ESRD, and a Permcath is necessary.  Risks and benefits are discussed and informed consent is obtained.    DESCRIPTION: After obtaining full informed written consent, the patient was brought back to the vascular suited. The patient's left neck and chest were sterilely prepped and draped in a sterile surgical field was created. Moderate conscious sedation was administered during a face to face encounter with the patient throughout the procedure with my supervision of the RN administering medicines and monitoring the patient's vital signs, pulse oximetry, telemetry and mental status throughout from the start of the procedure until the patient was taken to the recovery room.  The left internal jugular vein was visualized with ultrasound and found to be patent. It was then accessed under direct ultrasound guidance and a permanent image was recorded. A wire was placed. After skin nick and dilatation, the peel-away sheath was placed over the wire. I then turned my attention to an area under the clavicle. Approximately 1-2 fingerbreadths below the clavicle  a small counterincision was created and tunneled from the subclavicular incision to the access site. Using fluoroscopic guidance, a 23 centimeter tip to cuff tunneled hemodialysis catheter was selected, and tunneled from the subclavicular incision to the access site. It was then placed through the peel-away sheath and the peel-away sheath was removed. Using fluoroscopic guidance the catheter tips were parked in the right atrium. The appropriate distal connectors were placed. It withdrew blood well and flushed easily with heparinized saline and a concentrated heparin  solution was then placed. It was secured to the chest wall with 2 Prolene sutures. The access incision was closed single 4-0 Monocryl. A 4-0 Monocryl pursestring suture was placed around the exit site. Sterile dressings were placed. The patient tolerated the procedure well and was taken to the recovery room in stable condition.  COMPLICATIONS: None  CONDITION: Stable  Selinda Gu  09/06/2023, 11:36 AM   This note was created with Dragon Medical transcription system. Any errors in dictation are purely unintentional.

## 2023-09-07 ENCOUNTER — Encounter: Payer: Self-pay | Admitting: Vascular Surgery

## 2023-09-07 DIAGNOSIS — J9601 Acute respiratory failure with hypoxia: Secondary | ICD-10-CM | POA: Diagnosis not present

## 2023-09-07 DIAGNOSIS — R7881 Bacteremia: Secondary | ICD-10-CM

## 2023-09-07 DIAGNOSIS — N186 End stage renal disease: Secondary | ICD-10-CM

## 2023-09-07 DIAGNOSIS — J189 Pneumonia, unspecified organism: Secondary | ICD-10-CM | POA: Diagnosis not present

## 2023-09-07 LAB — RENAL FUNCTION PANEL
Albumin: 2.4 g/dL — ABNORMAL LOW (ref 3.5–5.0)
Anion gap: 9 (ref 5–15)
BUN: 28 mg/dL — ABNORMAL HIGH (ref 8–23)
CO2: 28 mmol/L (ref 22–32)
Calcium: 8 mg/dL — ABNORMAL LOW (ref 8.9–10.3)
Chloride: 97 mmol/L — ABNORMAL LOW (ref 98–111)
Creatinine, Ser: 7.28 mg/dL — ABNORMAL HIGH (ref 0.61–1.24)
GFR, Estimated: 8 mL/min — ABNORMAL LOW (ref >60)
Glucose, Bld: 105 mg/dL — ABNORMAL HIGH (ref 70–99)
Phosphorus: 3.2 mg/dL (ref 2.5–4.6)
Potassium: 4.1 mmol/L (ref 3.5–5.1)
Sodium: 134 mmol/L — ABNORMAL LOW (ref 135–145)

## 2023-09-07 LAB — CBC
HCT: 26.7 % — ABNORMAL LOW (ref 39.0–52.0)
Hemoglobin: 8.3 g/dL — ABNORMAL LOW (ref 13.0–17.0)
MCH: 28.1 pg (ref 26.0–34.0)
MCHC: 31.1 g/dL (ref 30.0–36.0)
MCV: 90.5 fL (ref 80.0–100.0)
Platelets: 242 10*3/uL (ref 150–400)
RBC: 2.95 MIL/uL — ABNORMAL LOW (ref 4.22–5.81)
RDW: 18 % — ABNORMAL HIGH (ref 11.5–15.5)
WBC: 5.6 10*3/uL (ref 4.0–10.5)
nRBC: 0.5 % — ABNORMAL HIGH (ref 0.0–0.2)

## 2023-09-07 LAB — CULTURE, BLOOD (ROUTINE X 2)
Culture: NO GROWTH
Culture: NO GROWTH
Special Requests: ADEQUATE
Special Requests: ADEQUATE

## 2023-09-07 MED ORDER — VANCOMYCIN IV (FOR PTA / DISCHARGE USE ONLY): 1000.0000 mg | INTRAVENOUS | Status: AC

## 2023-09-07 MED ORDER — METRONIDAZOLE 500 MG PO TABS: 500.0000 mg | ORAL_TABLET | Freq: Two times a day (BID) | ORAL | 0 refills | Status: DC

## 2023-09-07 MED ORDER — METRONIDAZOLE 500 MG PO TABS
500.0000 mg | ORAL_TABLET | Freq: Two times a day (BID) | ORAL | Status: DC
  Administered 2023-09-07: 500 mg via ORAL
  Filled 2023-09-07: qty 1

## 2023-09-07 MED ORDER — DM-GUAIFENESIN ER 30-600 MG PO TB12: 1.0000 | ORAL_TABLET | Freq: Two times a day (BID) | ORAL | 0 refills | Status: DC | PRN

## 2023-09-07 NOTE — Progress Notes (Signed)
 Infectious Disease Long Term IV Antibiotic Orders [name] 1960-10-31  Diagnosis: AVG infection and MRSA bacteremia  Culture results MRSA  LABS Lab Results  Component Value Date   CREATININE 7.28 (H) 09/07/2023   Lab Results  Component Value Date   WBC 5.6 09/07/2023   HGB 8.3 (L) 09/07/2023   HCT 26.7 (L) 09/07/2023   MCV 90.5 09/07/2023   PLT 242 09/07/2023   Lab Results  Component Value Date   ESRSEDRATE 68 (H) 03/06/2022   Lab Results  Component Value Date   CRP 25.4 (H) 03/06/2022    Allergies:  Allergies  Allergen Reactions   Ivp Dye [Iodinated Contrast Media] Anaphylaxis   Tramadol  Rash    Discharge antibiotics Vancomycin          1000 mg with HD Oral flagyl  until 6/29   PICC Care per protocol Labs weekly while on IV antibiotics -FAX weekly labs to [phone]  CBC w diff   Comprehensive met panel Vancomycin  Trough    Planned duration of antibiotics 4 weeks from removal of the AVG  Stop date 7/16  Follow up clinic date TBD  Alm SHAUNNA Needle, MD

## 2023-09-07 NOTE — TOC Transition Note (Signed)
 Transition of Care [facility]) - Discharge Note   Patient Details  Name: Adam Howell MRN: [id number] Date of Birth: September 26, 1960  Transition of Care [facility]) CM/SW Contact:  Racheal LITTIE Schimke, RN Phone Number: 09/07/2023, 11:50 AM   Final next level of care: Home w Home Health Services Barriers to Discharge: Barriers Resolved   Patient Goals and CMS Choice Patient states their goals for this hospitalization and ongoing recovery are:: To return home with [facility] Services   Choice offered to / list presented to : Patient      t   Post Acute Care Choice: Skilled Nursing Facility          DME Arranged: N/A DME Agency: NA       HH Arranged: RN, Nurse's Aide, PT, OT HH Agency: [facility] Date HH Agency Contacted: 09/07/23   Representative spoke with at [facility] Agency: Shaun  Social Drivers of Health (SDOH) Interventions SDOH Screenings   Food Insecurity: No Food Insecurity (08/31/2023)  Housing: Low Risk  (08/31/2023)  Transportation Needs: No Transportation Needs (08/31/2023)  Utilities: Not At Risk (08/31/2023)  Alcohol Screen: Low Risk  (11/25/2021)  Depression (PHQ2-9): Low Risk  (04/14/2023)  Financial Resource Strain: Low Risk  (11/25/2021)  Physical Activity: Insufficiently Active (11/25/2021)  Social Connections: Moderately Isolated (12/29/2022)  Stress: No Stress Concern Present (11/25/2021)  Tobacco Use: Low Risk  (08/29/2023)     Readmission Risk Interventions    09/01/2023   11:18 AM 02/16/2023    6:21 PM  Readmission Risk Prevention Plan  Transportation Screening  Complete  PCP or Specialist Appt within 3-5 Days Complete   HRI or Home Care Consult Complete   Social Work Consult for Recovery Care Planning/Counseling Complete   Palliative Care Screening Not Applicable Not Applicable  Medication Review Oceanographer)  Complete

## 2023-09-07 NOTE — Plan of Care (Signed)
  Problem: Activity: Goal: Ability to tolerate increased activity will improve Outcome: Adequate for Discharge   Problem: Clinical Measurements: Goal: Ability to maintain a body temperature in the normal range will improve Outcome: Adequate for Discharge   Problem: Respiratory: Goal: Ability to maintain adequate ventilation will improve Outcome: Adequate for Discharge Goal: Ability to maintain a clear airway will improve Outcome: Adequate for Discharge   Problem: Education: Goal: Knowledge of General Education information will improve Description: Including pain rating scale, medication(s)/side effects and non-pharmacologic comfort measures Outcome: Adequate for Discharge   Problem: Health Behavior/Discharge Planning: Goal: Ability to manage health-related needs will improve Outcome: Adequate for Discharge   Problem: Clinical Measurements: Goal: Ability to maintain clinical measurements within normal limits will improve Outcome: Adequate for Discharge Goal: Will remain free from infection Outcome: Adequate for Discharge Goal: Diagnostic test results will improve Outcome: Adequate for Discharge Goal: Respiratory complications will improve Outcome: Adequate for Discharge Goal: Cardiovascular complication will be avoided Outcome: Adequate for Discharge   Problem: Activity: Goal: Risk for activity intolerance will decrease Outcome: Adequate for Discharge   Problem: Nutrition: Goal: Adequate nutrition will be maintained Outcome: Adequate for Discharge   Problem: Coping: Goal: Level of anxiety will decrease Outcome: Adequate for Discharge   Problem: Elimination: Goal: Will not experience complications related to bowel motility Outcome: Adequate for Discharge Goal: Will not experience complications related to urinary retention Outcome: Adequate for Discharge   Problem: Pain Managment: Goal: General experience of comfort will improve and/or be controlled Outcome:  Adequate for Discharge   Problem: Safety: Goal: Ability to remain free from injury will improve Outcome: Adequate for Discharge   Problem: Skin Integrity: Goal: Risk for impaired skin integrity will decrease Outcome: Adequate for Discharge   Problem: Clinical Measurements: Goal: Ability to maintain a body temperature in the normal range will improve Outcome: Adequate for Discharge   Problem: Respiratory: Goal: Ability to maintain adequate ventilation will improve Outcome: Adequate for Discharge

## 2023-09-07 NOTE — Progress Notes (Signed)
 [facility]  ROUNDING NOTE   Subjective:   Adam Howell is a 63 y.o male with past medical history of diabetes, hypertension, CAD, Rt AKA, anemia, CHF, and ESRD on dialysis. Patient presents to vascular surgery for revision of AVF. He is being admitted for Aspiration pneumonia (HCC) [J69.0] HCAP (healthcare-associated pneumonia) [J18.9] Pneumonia of both lungs due to infectious organism, unspecified part of lung [J18.9] Acute hypoxic respiratory failure (HCC) [J96.01]  Patient presented to the hospital for shortness of breath.  He was diagnosed with pneumonia and is being admitted for further management.  Blood cultures are positive for staph species and MRSA in addition to gram-positive rods.  Femoral tunneled dialysis catheter was removed 6-17/25.  Update: Left IJ PermCath placed. Underwent dialysis treatment yesterday. UF achieved was 2 kg.   Objective:  Vital signs in last 24 hours:  Temp:  [97.9 F (36.6 C)-98.7 F (37.1 C)] 98.5 F (36.9 C) (06/24 0829) Pulse Rate:  [0-72] 60 (06/24 0829) Resp:  [0-22] 18 (06/24 0829) BP: (94-155)/(57-84) 109/64 (06/24 0829) SpO2:  [88 %-100 %] 92 % (06/24 0829) Weight:  [101.5 kg-103.6 kg] 103.6 kg (06/24 0339)  Weight change: -2.2 kg Filed Weights   09/06/23 1322 09/06/23 1739 09/07/23 0339  Weight: 103.5 kg 101.5 kg 103.6 kg    Intake/Output: I/O last 3 completed shifts: In: 185.2 [I.V.:185.2] Out: 2000 [Other:2000]   Intake/Output this shift:  No intake/output data recorded.  Physical Exam: General: No acute distress  Head: [place]/AT, hearing intact  Eyes: Anicteric  Lungs:  Bilateral coarse breath sounds  Heart: Regular rate and rhythm  Abdomen:  Soft, nontender, obese  Extremities: No peripheral edema  Right AKA  Neurologic: Awake, alert  Skin: No lesions  Access: Jump graft removed, left IJ PermCath, left femoral dialysis catheter    Basic Metabolic Panel: Recent Labs  Lab 09/01/23 0416  09/03/23 0417 09/04/23 0437 09/06/23 0323 09/07/23 0403  NA 133* 135 134* 133* 134*  K 4.3 4.7 4.2 5.0 4.1  CL 94* 94* 97* 97* 97*  CO2 27 28 26 23 28   GLUCOSE 100* 142* 102* 102* 105*  BUN 41* 60* 33* 48* 28*  CREATININE 9.14* 12.41* 7.94* 11.08* 7.28*  CALCIUM  8.2* 8.1* 8.3* 8.1* 8.0*  PHOS  --  5.0* 3.4 3.7 3.2    Liver Function Tests: Recent Labs  Lab 09/03/23 0417 09/04/23 0437 09/06/23 0323 09/07/23 0403  ALBUMIN  2.4* 2.6* 2.4* 2.4*   No results for input(s): LIPASE, AMYLASE in the last 168 hours. No results for input(s): AMMONIA in the last 168 hours.  CBC: Recent Labs  Lab 09/01/23 0416 09/03/23 0417 09/04/23 0437 09/06/23 0323 09/07/23 0403  WBC 4.5 4.6 4.1 5.0 5.6  HGB 8.0* 7.7* 8.3* 8.6* 8.3*  HCT 26.4* 24.2* 27.5* 29.0* 26.7*  MCV 90.7 92.4 90.5 90.3 90.5  PLT 91* 158 211 225 242    Cardiac Enzymes: No results for input(s): CKTOTAL, CKMB, CKMBINDEX, TROPONINI in the last 168 hours.  BNP: Invalid input(s): POCBNP  CBG: Recent Labs  Lab 09/01/23 1326  GLUCAP 96     Microbiology: Results for orders placed or performed during the hospital encounter of 08/29/23  Resp panel by RT-PCR (RSV, Flu A&B, Covid) Anterior Nasal Swab     Status: None   Collection Time: 08/29/23  8:00 PM   Specimen: Anterior Nasal Swab  Result Value Ref Range Status   SARS Coronavirus 2 by RT PCR NEGATIVE NEGATIVE Final    Comment: (NOTE) SARS-CoV-2 target  nucleic acids are NOT DETECTED.  The SARS-CoV-2 RNA is generally detectable in upper respiratory specimens during the acute phase of infection. The lowest concentration of SARS-CoV-2 viral copies this assay can detect is 138 copies/mL. A negative result does not preclude SARS-Cov-2 infection and should not be used as the sole basis for treatment or other patient management decisions. A negative result may occur with  improper specimen collection/handling, submission of specimen other than  nasopharyngeal swab, presence of viral mutation(s) within the areas targeted by this assay, and inadequate number of viral copies(<138 copies/mL). A negative result must be combined with clinical observations, patient history, and epidemiological information. The expected result is Negative.  Fact Sheet for Patients:  BloggerCourse.com  Fact Sheet for Healthcare Providers:  SeriousBroker.it  This test is no t yet approved or cleared by the [place]  FDA and  has been authorized for detection and/or diagnosis of SARS-CoV-2 by FDA under an Emergency Use Authorization (EUA). This EUA will remain  in effect (meaning this test can be used) for the duration of the COVID-19 declaration under Section 564(b)(1) of the Act, 21 U.S.C.section 360bbb-3(b)(1), unless the authorization is terminated  or revoked sooner.       Influenza A by PCR NEGATIVE NEGATIVE Final   Influenza B by PCR NEGATIVE NEGATIVE Final    Comment: (NOTE) The Xpert Xpress SARS-CoV-2/FLU/RSV plus assay is intended as an aid in the diagnosis of influenza from Nasopharyngeal swab specimens and should not be used as a sole basis for treatment. Nasal washings and aspirates are unacceptable for Xpert Xpress SARS-CoV-2/FLU/RSV testing.  Fact Sheet for Patients: BloggerCourse.com  Fact Sheet for Healthcare Providers: SeriousBroker.it  This test is not yet approved or cleared by the [place]  FDA and has been authorized for detection and/or diagnosis of SARS-CoV-2 by FDA under an Emergency Use Authorization (EUA). This EUA will remain in effect (meaning this test can be used) for the duration of the COVID-19 declaration under Section 564(b)(1) of the Act, 21 U.S.C. section 360bbb-3(b)(1), unless the authorization is terminated or revoked.     Resp Syncytial Virus by PCR NEGATIVE NEGATIVE Final    Comment:  (NOTE) Fact Sheet for Patients: BloggerCourse.com  Fact Sheet for Healthcare Providers: SeriousBroker.it  This test is not yet approved or cleared by the [place]  FDA and has been authorized for detection and/or diagnosis of SARS-CoV-2 by FDA under an Emergency Use Authorization (EUA). This EUA will remain in effect (meaning this test can be used) for the duration of the COVID-19 declaration under Section 564(b)(1) of the Act, 21 U.S.C. section 360bbb-3(b)(1), unless the authorization is terminated or revoked.  Performed at [facility], [address] [place], [place] [address]   Blood Culture (routine x 2)     Status: Abnormal   Collection Time: 08/30/23 12:06 AM   Specimen: BLOOD RIGHT HAND  Result Value Ref Range Status   Specimen Description   Final    BLOOD RIGHT HAND Performed at [facility], [address] [place], [place] [address]    Special Requests   Final    BOTTLES DRAWN AEROBIC AND ANAEROBIC Blood Culture results may not be optimal due to an inadequate volume of blood received in culture bottles Performed at [facility], [address], [place], [place] [address]    Culture  Setup Time   Final    GRAM POSITIVE COCCI IN BOTH AEROBIC AND ANAEROBIC BOTTLES CRITICAL RESULT CALLED TO, READ BACK BY AND VERIFIED WITH: TREY GREENWOOD  08/30/23 1230 MW Performed at [facility], [address], [place], [place] [address]    Culture (A)  Final    STAPHYLOCOCCUS AUREUS SUSCEPTIBILITIES PERFORMED ON PREVIOUS CULTURE WITHIN THE LAST 5 DAYS. Performed at [facility], [address] [place], [place] [address]    Report Status 09/01/2023 FINAL  Final  Blood Culture (routine x 2)     Status: Abnormal   Collection Time: 08/30/23 12:06 AM   Specimen: BLOOD RIGHT ARM  Result Value Ref Range Status   Specimen Description   Final    BLOOD RIGHT ARM Performed at [facility], [address], [place], [place] [address]    Special Requests   Final    BOTTLES DRAWN AEROBIC AND ANAEROBIC Blood Culture results may not be optimal due to an inadequate volume of blood received in culture bottles Performed at [facility], [address] [place], [place] [address]    Culture  Setup Time   Final    GRAM POSITIVE COCCI IN BOTH AEROBIC AND ANAEROBIC BOTTLES CRITICAL RESULT CALLED TO, READ BACK BY AND VERIFIED WITH: TREY GREENWOOD 08/30/23 1230 MW GRAM POSITIVE RODS ANAEROBIC BOTTLE ONLY CRITICAL RESULT CALLED TO, READ BACK BY AND VERIFIED WITH: PHARMD W. ANDERSON [id number] @1700  FH Performed at [facility], [address] [place], [place] [address]    Culture (A)  Final    METHICILLIN RESISTANT STAPHYLOCOCCUS AUREUS CLOSTRIDIUM PERFRINGENS    Report Status 09/01/2023 FINAL  Final   Organism ID, Bacteria METHICILLIN RESISTANT STAPHYLOCOCCUS AUREUS  Final      Susceptibility   Methicillin resistant staphylococcus aureus - MIC*    CIPROFLOXACIN >=8 RESISTANT Resistant     ERYTHROMYCIN >=8 RESISTANT Resistant     GENTAMICIN <=0.5 SENSITIVE Sensitive     OXACILLIN >=4 RESISTANT Resistant     TETRACYCLINE >=16 RESISTANT Resistant     VANCOMYCIN  1 SENSITIVE Sensitive     TRIMETH /SULFA  <=10 SENSITIVE Sensitive     CLINDAMYCIN >=8 RESISTANT Resistant     RIFAMPIN <=0.5 SENSITIVE Sensitive     Inducible Clindamycin NEGATIVE Sensitive     LINEZOLID  2 SENSITIVE Sensitive     * METHICILLIN RESISTANT STAPHYLOCOCCUS AUREUS  Blood Culture ID Panel (Reflexed)     Status: Abnormal   Collection Time: 08/30/23 12:06 AM  Result Value Ref Range Status   Enterococcus faecalis NOT DETECTED NOT DETECTED Final   Enterococcus Faecium NOT DETECTED NOT DETECTED Final   Listeria monocytogenes NOT DETECTED NOT DETECTED Final   Staphylococcus species DETECTED (A) NOT DETECTED Final    Comment: CRITICAL RESULT CALLED TO, READ BACK BY AND VERIFIED WITH: TREY  GREENWOOD 08/30/23 1230 MW    Staphylococcus aureus (BCID) DETECTED (A) NOT DETECTED Final    Comment: Methicillin (oxacillin)-resistant Staphylococcus aureus (MRSA). MRSA is predictably resistant to beta-lactam antibiotics (except ceftaroline). Preferred therapy is vancomycin  unless clinically contraindicated. Patient requires contact precautions if  hospitalized. CRITICAL RESULT CALLED TO, READ BACK BY AND VERIFIED WITH: TREY GREENWOOD 08/30/23 1230 MW    Staphylococcus epidermidis NOT DETECTED NOT DETECTED Final   Staphylococcus lugdunensis NOT DETECTED NOT DETECTED Final   Streptococcus species NOT DETECTED NOT DETECTED Final   Streptococcus agalactiae NOT DETECTED NOT DETECTED Final   Streptococcus pneumoniae NOT DETECTED NOT DETECTED Final   Streptococcus pyogenes NOT DETECTED NOT DETECTED Final   A.calcoaceticus-baumannii NOT DETECTED NOT DETECTED Final   Bacteroides fragilis NOT DETECTED NOT DETECTED Final   Enterobacterales NOT DETECTED NOT DETECTED Final  Enterobacter cloacae complex NOT DETECTED NOT DETECTED Final   Escherichia coli NOT DETECTED NOT DETECTED Final   Klebsiella aerogenes NOT DETECTED NOT DETECTED Final   Klebsiella oxytoca NOT DETECTED NOT DETECTED Final   Klebsiella pneumoniae NOT DETECTED NOT DETECTED Final   Proteus species NOT DETECTED NOT DETECTED Final   Salmonella species NOT DETECTED NOT DETECTED Final   Serratia marcescens NOT DETECTED NOT DETECTED Final   Haemophilus influenzae NOT DETECTED NOT DETECTED Final   Neisseria meningitidis NOT DETECTED NOT DETECTED Final   Pseudomonas aeruginosa NOT DETECTED NOT DETECTED Final   Stenotrophomonas maltophilia NOT DETECTED NOT DETECTED Final   Candida albicans NOT DETECTED NOT DETECTED Final   Candida auris NOT DETECTED NOT DETECTED Final   Candida glabrata NOT DETECTED NOT DETECTED Final   Candida krusei NOT DETECTED NOT DETECTED Final   Candida parapsilosis NOT DETECTED NOT DETECTED Final   Candida  tropicalis NOT DETECTED NOT DETECTED Final   Cryptococcus neoformans/gattii NOT DETECTED NOT DETECTED Final   Meth resistant mecA/C and MREJ DETECTED (A) NOT DETECTED Final    Comment: CRITICAL RESULT CALLED TO, READ BACK BY AND VERIFIED WITH: MOSE BLEW 08/30/23 1230 MW Performed at [facility], [address] [place], [place] [address]   MRSA Next Gen by PCR, Nasal     Status: Abnormal   Collection Time: 08/30/23  2:53 PM   Specimen: Nasal Mucosa; Nasal Swab  Result Value Ref Range Status   MRSA by PCR Next Gen DETECTED (A) NOT DETECTED Final    Comment: CRITICAL RESULT CALLED TO, READ BACK BY AND VERIFIED WITH:  ZADA GARRISON RN 08/30/2023 @1624  KKG (NOTE) The GeneXpert MRSA Assay (FDA approved for NASAL specimens only), is one component of a comprehensive MRSA colonization surveillance program. It is not intended to diagnose MRSA infection nor to guide or monitor treatment for MRSA infections. Test performance is not FDA approved in patients less than 64 years old. Performed at [facility], [address] [place], [place] [address]   Cath Tip Culture     Status: None   Collection Time: 08/31/23  8:50 AM   Specimen: Catheter Tip; Other  Result Value Ref Range Status   Specimen Description   Final    CATH TIP Performed at [facility], [address], [place], [place] [address]    Special Requests   Final    NONE Performed at [facility], [address], [place], [place] [address]    Culture   Final    NO GROWTH 2 DAYS Performed at [facility], [address], [place], [place] [address]    Report Status 09/02/2023 FINAL  Final  Culture, blood (single) w Reflex to ID Panel     Status: None   Collection Time: 08/31/23 10:12 AM   Specimen: BLOOD  Result Value Ref Range Status   Specimen Description BLOOD RIGHT FA  Final   Special Requests   Final    BOTTLES DRAWN AEROBIC AND ANAEROBIC Blood Culture adequate volume    Culture   Final    NO GROWTH 5 DAYS Performed at [facility], [address], [place], [place] [address]    Report Status 09/05/2023 FINAL  Final  Aerobic/Anaerobic Culture w Gram Stain (surgical/deep wound)     Status: None   Collection Time: 09/01/23 12:21 PM   Specimen: Path Tissue  Result Value Ref Range Status   Specimen Description   Final    TISSUE Performed at  [facility], [address], [place], [place] [address]    Special Requests   Final    NONE Performed at [facility], [address] [place], [place] [address]    Gram Stain NO WBC SEEN NO ORGANISMS SEEN   Final   Culture   Final    FEW METHICILLIN RESISTANT STAPHYLOCOCCUS AUREUS NO ANAEROBES ISOLATED Performed at [facility], [address], [place], [place] [address]    Report Status 09/06/2023 FINAL  Final   Organism ID, Bacteria METHICILLIN RESISTANT STAPHYLOCOCCUS AUREUS  Final      Susceptibility   Methicillin resistant staphylococcus aureus - MIC*    CIPROFLOXACIN >=8 RESISTANT Resistant     ERYTHROMYCIN >=8 RESISTANT Resistant     GENTAMICIN <=0.5 SENSITIVE Sensitive     OXACILLIN >=4 RESISTANT Resistant     TETRACYCLINE >=16 RESISTANT Resistant     VANCOMYCIN  1 SENSITIVE Sensitive     TRIMETH /SULFA  <=10 SENSITIVE Sensitive     CLINDAMYCIN >=8 RESISTANT Resistant     RIFAMPIN <=0.5 SENSITIVE Sensitive     Inducible Clindamycin NEGATIVE Sensitive     LINEZOLID  2 SENSITIVE Sensitive     * FEW METHICILLIN RESISTANT STAPHYLOCOCCUS AUREUS  Culture, blood (Routine X 2) w Reflex to ID Panel     Status: None   Collection Time: 09/02/23  8:08 AM   Specimen: BLOOD  Result Value Ref Range Status   Specimen Description BLOOD RIGHT ANTECUBITAL  Final   Special Requests   Final    BOTTLES DRAWN AEROBIC AND ANAEROBIC Blood Culture adequate volume   Culture   Final    NO GROWTH 5 DAYS Performed at [facility], [address] [place], [place] [address]     Report Status 09/07/2023 FINAL  Final  Culture, blood (Routine X 2) w Reflex to ID Panel     Status: None   Collection Time: 09/02/23  8:08 AM   Specimen: BLOOD  Result Value Ref Range Status   Specimen Description BLOOD BLOOD RIGHT FOREARM  Final   Special Requests   Final    BOTTLES DRAWN AEROBIC AND ANAEROBIC Blood Culture adequate volume   Culture   Final    NO GROWTH 5 DAYS Performed at [facility], [address] [place], [place] [address]    Report Status 09/07/2023 FINAL  Final  Expectorated Sputum Assessment w Gram Stain, Rflx to Resp Cult     Status: None   Collection Time: 09/04/23  3:13 AM   Specimen: Expectorated Sputum  Result Value Ref Range Status   Specimen Description EXPECTORATED SPUTUM  Final   Special Requests NONE  Final   Sputum evaluation   Final    Sputum specimen not acceptable for testing.  Please recollect.   RESULT CALLED TO, READ BACK BY AND VERIFIED WITH: MARCELITA WINT @ 09/04/23 0448 AB Performed at [facility], [address] [place], [place] [address]    Report Status 09/04/2023 FINAL  Final    Coagulation Studies: No results for input(s): LABPROT, INR in the last 72 hours.   Urinalysis: No results for input(s): COLORURINE, LABSPEC, PHURINE, GLUCOSEU, HGBUR, BILIRUBINUR, KETONESUR, PROTEINUR, UROBILINOGEN, NITRITE, LEUKOCYTESUR in the last 72 hours.  Invalid input(s): APPERANCEUR    Imaging: PERIPHERAL VASCULAR CATHETERIZATION Result Date: 09/06/2023 See surgical note for result.      Medications:    sodium chloride  10 mL/hr at 09/06/23 1850   metronidazole  500 mg (09/07/23 0537)   vancomycin  Stopped (09/06/23 1801)  amiodarone   200 mg Oral BID   apixaban   2.5 mg Oral BID   aspirin  EC  81 mg Oral Daily   carvedilol   6.25 mg Oral Daily   Chlorhexidine  Gluconate Cloth  6 each Topical Q0600   epoetin  alfa-epbx (RETACRIT ) injection  10,000 Units Intravenous Q M,W,F-HD   iron   polysaccharides  150 mg Oral Daily   leptospermum manuka honey  1 Application Topical Daily   multivitamin  1 tablet Oral QHS   rosuvastatin   40 mg Oral Daily   senna  1 tablet Oral Daily   sevelamer  carbonate  2,400 mg Oral TID WC   traZODone   100 mg Oral QHS   acetaminophen , albuterol , dextromethorphan -guaiFENesin , feeding supplement (NEPRO CARB STEADY), lidocaine  (PF), lidocaine -prilocaine , melatonin, midodrine , ondansetron  (ZOFRAN ) IV, oxyCODONE -acetaminophen , pentafluoroprop-tetrafluoroeth, phenol  Assessment/ Plan:  Mr. Cliffard Hair is a 63 y.o.  male with end stage renal disease on hemodialysis, diabetes, hypertension, CAD, Rt AKA, anemia, CHF who was admitted to [facility] on 08/29/2023 for revision of AVF. Patient had anaphylactic reaction and admitted to ICU.   [facility]/MWF/left AVG removed  End-stage renal disease with hyperkalemia on hemodialysis.   - Jump graft removed 09/01/2023.   - Remove left femoral temporary dialysis catheter.  Right IJ PermCath placed.  2. Anemia of chronic kidney disease Lab Results  Component Value Date   HGB 8.3 (L) 09/07/2023   Hemoglobin still below target at 8.3.  Continue Epogen  10,000's IV with dialysis.  3. Secondary Hyperparathyroidism: with outpatient labs:     Lab Results  Component Value Date   CALCIUM  8.0 (L) 09/07/2023   CAION 1.06 (L) 08/05/2023   PHOS 3.2 09/07/2023  Phosphorus remains at target at 3.2 while on Renvela  2400 mg 3 times daily.  4. Pneumonia, sepsis and shortness of breath - Blood cultures (6/16) positive for MRSA and gram-positive rods  - iv Abx as per hospitalist team     LOS: 9 Nanetta Wiegman 6/24/20258:35 AM

## 2023-09-07 NOTE — Progress Notes (Signed)
 PHARMACY CONSULT NOTE FOR:  OUTPATIENT  PARENTERAL ANTIBIOTIC THERAPY (OPAT)  Indication: MRSA bacteremia from infected AVG Regimen: Vancomycin  1gm IV qHD on MWF at HD centr End date: 09/29/2023 - Nephrology aware and will notified HD center of above plan   **Also to receive metronidazole  500mg  po BID through 6/29   Weekly labs: CBC/diff, CMP, pre-HD vancomycin  level  IV antibiotic discharge orders are pended. To discharging provider:  please sign these orders via discharge navigator,  Select New Orders & click on the button choice - Manage This Unsigned Work.     Thank you for allowing pharmacy to be a part of this patient's care.  Amir Fick, PharmD, BCPS, BCIDP Work Cell: [phone] 09/07/2023 11:12 AM

## 2023-09-07 NOTE — Progress Notes (Incomplete)
 PROGRESS NOTE Adam Howell  FMW:[id number] DOB: [birth year] DOA: 08/29/2023 PCP: Adam Melanie DASEN, NP  Adam Howell is a 63 y.o. male with medical history significant of ESRD-HD (MWF), HTN, HLD, DM, CAD, dCHF, A fib on Eliquis , amenia, chronic pain, s/p of right AKA, who presents with SOB. Of note, had complicated admission from 5/22 - 5/29 including intubation and pressor use in ICU.   Had 2L O2 requirement on presentation. Treated for aspiration pneumonia. Now weaned to room air.  Also found to have MRSA bacteremia with source suspected to be recent temp cath or AVF site. TEE neg vegetation. Undergoing minimum of 6 weeks vancomycin  with HD. Ongoing management of vascular access modifications per vascular surgery. ID following for Abx guidance. Nephrology managing HD. PT/OT recommended SNF but patient declined and will go home with his daughter instead with HH. Dc is pending more stable HD access. Vascular- permcath placement 6/23.    Assessment & Plan:   Principal Problem:   Pneumonia of both lungs due to infectious organism Active Problems:   Aspiration pneumonia (HCC)   CAD (coronary artery disease)   Myocardial injury   ESRD on dialysis [facility])   Atrial fibrillation, chronic (HCC)   Type 2 diabetes mellitus with ESRD (end-stage renal disease) (HCC)   Hyperlipidemia associated with type 2 diabetes mellitus (HCC)   Chronic diastolic CHF (congestive heart failure) (HCC)   Chronic pain syndrome   Pancytopenia (HCC)   Anemia in ESRD (end-stage renal disease) (HCC)   Overweight (BMI 25.0-29.9)   Encounter for dialysis catheter care [facility])   Acute hypoxic respiratory failure (HCC)   Bacteremia   Endocarditis  Assessment and Plan:  HAP  vs. aspiration pneumonia: Does not meet criteria for sepsis. Weaned to room air - MRSA swab positive - incentive spirometer and ambulate as much as tolerated  MRSA bacteremia: source possible HD catheter in R groin vs previous AVF site. S/p  HD catheter removed by vasc surg on 08/31/23. Now has new temp cath on L groin. S/p graft excision 6/18. TEE neg.  - continue vanco as per ID. Repeat blood cultures drawn 6/19, 6/17 NGTD. ID following and recs appreciated  - added flagyl  for clostridium coverage - f/u catheter tip culture, wound culture - minimum 6 week IV Abx with HD Will plan 4 week course from resection of AV graft material done 6/18   Clostridium perfringens bacteremia- mixed with the MRSA - likely from AVG site infection Day 8 of anaerobic coverage- will plan 2 week course.of oral flagyl      ESRD: on HD MWF.  - Nephro following and recs  - vascular consulted. Temp cath removed 6/17 from R groin. Graft excision 6/18. Placed temp cath on L groin. Permcath placed 6/23. - can discharge with permcath placement- likely tomorrow. Also undergoing HD today - receives midodrine  timed with HD   Hx of CAD & myocardial injury: trop 107, no chest pain.  Likely due to demand ischemia versus decreased clearance of troponin in the setting of ESRD. Continue on aspirin , statin   Left shin wound: present on admission. Wound care consulted   PAD  right AKA. Uses a wheelchair. Continue w/ supportive care - PT/OT- recommending SNF. Patient instead elects to go home with daughter.  -  continue on statin    Chronic a. fib:  - continue on amio, coreg , hold for MAP and/or HR <65.  - Continue on eliquis     DM2: well-controlled glucose on labs. No need for insulin  currently  Chronic diastolic CHF: volume/fluid management w/ HD    Chronic pain syndrome: continue on home dose of percocet    Bicytopenia: w/ likely ACD secondary to ESRD. S/p 1 unit of pRBCs transfuse so far. Repeat H&H are trending up. Platelets are labile.   DVT prophylaxis: eliquis  Code Status: full  Family Communication: none at bedside Disposition Plan: SNF  Level of care: Telemetry Medical  Status is: Inpatient Remains inpatient appropriate because: vascular  access modifications prior to SNF dc  Consultants:  ID Nephro  Vascular surgery   Procedures: HD, temp cath, excision of graft, permcath placement, TEE  Subjective: Pt reports feeling fine today. Post-procedure is sleepy but alert to voice  Objective: Vitals:   09/06/23 1735 09/06/23 1739 09/06/23 2100 09/07/23 0339  BP: 125/71  (!) 101/57 (!) 110/57  Pulse: 72 70 70 66  Resp: (!) 8 16 20 20   Temp: 98.7 F (37.1 C)  98.4 F (36.9 C) 98.1 F (36.7 C)  TempSrc: Oral  Oral Oral  SpO2: 96% 98% 97% 94%  Weight:  101.5 kg  103.6 kg  Height:        Intake/Output Summary (Last 24 hours) at 09/07/2023 9277 Last data filed at 09/07/2023 0400 Gross per 24 hour  Intake 185.17 ml  Output 2000 ml  Net -1814.83 ml   Filed Weights   09/06/23 1322 09/06/23 1739 09/07/23 0339  Weight: 103.5 kg 101.5 kg 103.6 kg   Examination: General exam: appears lethargic  Respiratory system: normal respiratory effort Cardiovascular system: S1 & S2+. No rubs, gallops or clicks.  Gastrointestinal system: Abdomen is nondistended, soft and nontender. Normal bowel sounds heard. Central nervous system: lethargic  Psychiatry: Judgement and insight appears at baseline. Flat mood and affect  Data Reviewed: I have personally reviewed following labs and imaging studies  CBC: Recent Labs  Lab 09/01/23 0416 09/03/23 0417 09/04/23 0437 09/06/23 0323 09/07/23 0403  WBC 4.5 4.6 4.1 5.0 5.6  HGB 8.0* 7.7* 8.3* 8.6* 8.3*  HCT 26.4* 24.2* 27.5* 29.0* 26.7*  MCV 90.7 92.4 90.5 90.3 90.5  PLT 91* 158 211 225 242   Basic Metabolic Panel: Recent Labs  Lab 09/01/23 0416 09/03/23 0417 09/04/23 0437 09/06/23 0323 09/07/23 0403  NA 133* 135 134* 133* 134*  K 4.3 4.7 4.2 5.0 4.1  CL 94* 94* 97* 97* 97*  CO2 27 28 26 23 28   GLUCOSE 100* 142* 102* 102* 105*  BUN 41* 60* 33* 48* 28*  CREATININE 9.14* 12.41* 7.94* 11.08* 7.28*  CALCIUM  8.2* 8.1* 8.3* 8.1* 8.0*  PHOS  --  5.0* 3.4 3.7 3.2    GFR: Estimated Creatinine Clearance: 13.7 mL/min (A) (by C-G formula based on SCr of 7.28 mg/dL (H)). Liver Function Tests: Recent Labs  Lab 09/03/23 0417 09/04/23 0437 09/06/23 0323 09/07/23 0403  ALBUMIN  2.4* 2.6* 2.4* 2.4*    LOS: 9 days    Adam Howell Piety, MD [facility]  If 7PM-7AM, please contact night-coverage [email or web address] 09/07/2023, 7:22 AM

## 2023-09-07 NOTE — Discharge Instructions (Addendum)
 Please follow up for your routine dialysis as you will need to complete IV antibiotics with your treatments for at least the next 4 weeks.  I prescribed a medication for your cough, congestion.  All other medications have stayed the same Follow up with your PCP in 1-2 weeks

## 2023-09-07 NOTE — TOC Transition Note (Signed)
 Transition of Care [facility]) - Discharge Note   Patient Details  Name: Adam Howell MRN: [id number] Date of Birth: Nov 01, 1960  [facility]) CM/SW Contact:  Lauraine JAYSON Carpen, LCSW Phone Number: 09/07/2023, 4:56 PM   Clinical Narrative:   Per RN, patient is total care and will require ambulance transport home. CSW called patient. Address on the facesheet is not correct. CSW called daughter to obtain address: [address], [address], [place], [place] [address]. EMS transport has been arranged and he is 7th on the list. No further concerns. CSW signing off.  Final next level of care: Home w Home Health Services Barriers to Discharge: Barriers Resolved   Patient Goals and CMS Choice Patient states their goals for this hospitalization and ongoing recovery are:: To return home with [facility] Services   Choice offered to / list presented to : Patient      Discharge Placement                    Patient and family notified of of transfer: 09/07/23  Discharge Plan and Services Additional resources added to the After Visit Summary for       Post Acute Care Choice: Skilled Nursing Facility          DME Arranged: N/A DME Agency: NA       HH Arranged: RN, Nurse's Aide, PT, OT HH Agency: [facility] Date HH Agency Contacted: 09/07/23   Representative spoke with at [facility] Agency: Shaun  Social Drivers of Health (SDOH) Interventions SDOH Screenings   Food Insecurity: No Food Insecurity (08/31/2023)  Housing: Low Risk  (08/31/2023)  Transportation Needs: No Transportation Needs (08/31/2023)  Utilities: Not At Risk (08/31/2023)  Alcohol Screen: Low Risk  (11/25/2021)  Depression (PHQ2-9): Low Risk  (04/14/2023)  Financial Resource Strain: Low Risk  (11/25/2021)  Physical Activity: Insufficiently Active (11/25/2021)  Social Connections: Moderately Isolated (12/29/2022)  Stress: No Stress Concern Present (11/25/2021)  Tobacco Use: Low Risk  (08/29/2023)      Readmission Risk Interventions    09/01/2023   11:18 AM 02/16/2023    6:21 PM  Readmission Risk Prevention Plan  Transportation Screening  Complete  PCP or Specialist Appt within 3-5 Days Complete   HRI or Home Care Consult Complete   Social Work Consult for Recovery Care Planning/Counseling Complete   Palliative Care Screening Not Applicable Not Applicable  Medication Review [employer])  Complete

## 2023-09-07 NOTE — Plan of Care (Signed)
 Problem: Activity: Goal: Ability to tolerate increased activity will improve 09/07/2023 1049 by Sheron Ival DEL, RN Outcome: Completed/Met 09/07/2023 1049 by Sheron Ival DEL, RN Outcome: Adequate for Discharge   Problem: Clinical Measurements: Goal: Ability to maintain a body temperature in the normal range will improve 09/07/2023 1049 by Sheron Ival DEL, RN Outcome: Completed/Met 09/07/2023 1049 by Sheron Ival DEL, RN Outcome: Adequate for Discharge   Problem: Respiratory: Goal: Ability to maintain adequate ventilation will improve 09/07/2023 1049 by Sheron Ival DEL, RN Outcome: Completed/Met 09/07/2023 1049 by Sheron Ival DEL, RN Outcome: Adequate for Discharge Goal: Ability to maintain a clear airway will improve 09/07/2023 1049 by Sheron Ival DEL, RN Outcome: Completed/Met 09/07/2023 1049 by Sheron Ival DEL, RN Outcome: Adequate for Discharge   Problem: Education: Goal: Knowledge of General Education information will improve Description: Including pain rating scale, medication(s)/side effects and non-pharmacologic comfort measures 09/07/2023 1049 by Sheron Ival DEL, RN Outcome: Completed/Met 09/07/2023 1049 by Sheron Ival DEL, RN Outcome: Adequate for Discharge   Problem: Health Behavior/Discharge Planning: Goal: Ability to manage health-related needs will improve 09/07/2023 1049 by Sheron Ival DEL, RN Outcome: Completed/Met 09/07/2023 1049 by Sheron Ival DEL, RN Outcome: Adequate for Discharge   Problem: Clinical Measurements: Goal: Ability to maintain clinical measurements within normal limits will improve 09/07/2023 1049 by Sheron Ival DEL, RN Outcome: Completed/Met 09/07/2023 1049 by Sheron Ival DEL, RN Outcome: Adequate for Discharge Goal: Will remain free from infection 09/07/2023 1049 by Sheron Ival DEL, RN Outcome: Completed/Met 09/07/2023 1049 by Sheron Ival DEL, RN Outcome: Adequate for Discharge Goal: Diagnostic test results will improve 09/07/2023  1049 by Sheron Ival DEL, RN Outcome: Completed/Met 09/07/2023 1049 by Sheron Ival DEL, RN Outcome: Adequate for Discharge Goal: Respiratory complications will improve 09/07/2023 1049 by Sheron Ival DEL, RN Outcome: Completed/Met 09/07/2023 1049 by Sheron Ival DEL, RN Outcome: Adequate for Discharge Goal: Cardiovascular complication will be avoided 09/07/2023 1049 by Sheron Ival DEL, RN Outcome: Completed/Met 09/07/2023 1049 by Sheron Ival DEL, RN Outcome: Adequate for Discharge   Problem: Activity: Goal: Risk for activity intolerance will decrease 09/07/2023 1049 by Sheron Ival DEL, RN Outcome: Completed/Met 09/07/2023 1049 by Sheron Ival DEL, RN Outcome: Adequate for Discharge   Problem: Nutrition: Goal: Adequate nutrition will be maintained 09/07/2023 1049 by Sheron Ival DEL, RN Outcome: Completed/Met 09/07/2023 1049 by Sheron Ival DEL, RN Outcome: Adequate for Discharge   Problem: Coping: Goal: Level of anxiety will decrease 09/07/2023 1049 by Sheron Ival DEL, RN Outcome: Completed/Met 09/07/2023 1049 by Sheron Ival DEL, RN Outcome: Adequate for Discharge   Problem: Elimination: Goal: Will not experience complications related to bowel motility 09/07/2023 1049 by Sheron Ival DEL, RN Outcome: Completed/Met 09/07/2023 1049 by Sheron Ival DEL, RN Outcome: Adequate for Discharge Goal: Will not experience complications related to urinary retention 09/07/2023 1049 by Sheron Ival DEL, RN Outcome: Completed/Met 09/07/2023 1049 by Sheron Ival DEL, RN Outcome: Adequate for Discharge   Problem: Pain Managment: Goal: General experience of comfort will improve and/or be controlled 09/07/2023 1049 by Sheron Ival DEL, RN Outcome: Completed/Met 09/07/2023 1049 by Sheron Ival DEL, RN Outcome: Adequate for Discharge   Problem: Safety: Goal: Ability to remain free from injury will improve 09/07/2023 1049 by Sheron Ival DEL, RN Outcome: Completed/Met 09/07/2023 1049 by  Sheron Ival DEL, RN Outcome: Adequate for Discharge   Problem: Skin Integrity: Goal: Risk for impaired skin integrity will decrease 09/07/2023 1049 by Sheron Ival DEL, RN Outcome: Completed/Met 09/07/2023 1049 by Sheron Ival DEL, RN Outcome: Adequate for  Discharge   Problem: Activity: Goal: Ability to tolerate increased activity will improve 09/07/2023 1049 by Sheron Ival DEL, RN Outcome: Completed/Met 09/07/2023 1049 by Sheron Ival DEL, RN Outcome: Adequate for Discharge   Problem: Clinical Measurements: Goal: Ability to maintain a body temperature in the normal range will improve 09/07/2023 1049 by Sheron Ival DEL, RN Outcome: Completed/Met 09/07/2023 1049 by Sheron Ival DEL, RN Outcome: Adequate for Discharge   Problem: Respiratory: Goal: Ability to maintain adequate ventilation will improve 09/07/2023 1049 by Sheron Ival DEL, RN Outcome: Completed/Met 09/07/2023 1049 by Sheron Ival DEL, RN Outcome: Adequate for Discharge   Problem: Respiratory: Goal: Ability to maintain a clear airway will improve 09/07/2023 1049 by Sheron Ival DEL, RN Outcome: Completed/Met 09/07/2023 1049 by Sheron Ival DEL, RN Outcome: Adequate for Discharge   Problem: Health Behavior/Discharge Planning: Goal: Ability to manage health-related needs will improve 09/07/2023 1049 by Sheron Ival DEL, RN Outcome: Completed/Met 09/07/2023 1049 by Sheron Ival DEL, RN Outcome: Adequate for Discharge   Problem: Health Behavior/Discharge Planning: Goal: Ability to manage health-related needs will improve 09/07/2023 1049 by Sheron Ival DEL, RN Outcome: Completed/Met 09/07/2023 1049 by Sheron Ival DEL, RN Outcome: Adequate for Discharge

## 2023-09-07 NOTE — Discharge Summary (Signed)
 Physician Discharge Summary  Patient: Adam Howell DOB: 1960/10/25   Code Status: Full Code Admit date: 08/29/2023 Discharge date: 09/07/2023 Disposition: Home health, PT, OT, nurse aid, and RN PCP: Valerio Melanie DASEN, NP  Recommendations for Outpatient Follow-up:  Follow up with PCP within 1-2 weeks Regarding general hospital follow up and preventative care Recommend BMP, CBC Follow up with ID Follow up with cardiology Follow up with nephrology  Discharge Diagnoses:  Principal Problem:   Pneumonia of both lungs due to infectious organism Active Problems:   Aspiration pneumonia (HCC)   CAD (coronary artery disease)   Myocardial injury   ESRD on dialysis [facility])   Atrial fibrillation, chronic (HCC)   Type 2 diabetes mellitus with ESRD (end-stage renal disease) (HCC)   Hyperlipidemia associated with type 2 diabetes mellitus (HCC)   Chronic diastolic CHF (congestive heart failure) (HCC)   Chronic pain syndrome   Pancytopenia (HCC)   Anemia in ESRD (end-stage renal disease) (HCC)   Overweight (BMI 25.0-29.9)   Encounter for dialysis catheter care [facility]   Acute hypoxic respiratory failure (HCC)   Bacteremia   Endocarditis  Brief Hospital Course Summary: Adam Howell is a 63 y.o. male with medical history significant of ESRD-HD (MWF), HTN, HLD, DM, CAD, dCHF, A fib on Eliquis , amenia, chronic pain, s/p of right AKA, who presents with SOB. Of note, had complicated admission from 5/22 - 5/29 including intubation and pressor use in ICU.   Had 2L O2 requirement on presentation. Treated for aspiration pneumonia. Now weaned to room air.  Also found to have MRSA bacteremia with source suspected to be recent temp cath or AVF site. TEE neg vegetation. Both sources removed. Follow up blood cultures NGTD. Undergoing minimum of 6 weeks vancomycin , flagyl  with HD.  Ongoing management of vascular access modifications per vascular surgery- permcath placed 6/23 prior to  dc and operable in HD session prior to dc. ID following for Abx guidance. Nephrology managing HD. PT/OT recommended SNF but patient declined and will go home with his daughter instead with HH.  All other chronic conditions were treated with home medications.    Discharge Condition: Good, improved Recommended discharge diet: Regular healthy diet  Consultations: ID Vascular surgery  Nephrology  Cardiology   Procedures/Studies: HD Temp cath removed 6/17 from R groin. Graft excision 6/18. Placed temp cath on L groin. Permcath placed 6/23    Allergies as of 09/07/2023       Reactions   Ivp Dye [iodinated Contrast Media] Anaphylaxis   Tramadol  Rash        Medication List     STOP taking these medications    melatonin 3 MG Tabs tablet   Santyl  250 UNIT/GM ointment Generic drug: collagenase    senna 8.6 MG Tabs tablet Commonly known as: SENOKOT       TAKE these medications    acetaminophen  325 MG tablet Commonly known as: TYLENOL  Take 2 tablets (650 mg total) by mouth every 6 (six) hours as needed for mild pain (pain score 1-3), fever or headache (or Fever >/= 101).   amiodarone  200 MG tablet Commonly known as: PACERONE  Take 1 tablet (200 mg total) by mouth 2 (two) times daily.   aspirin  EC 81 MG tablet Take 1 tablet (81 mg total) by mouth daily. Swallow whole.   carvedilol  6.25 MG tablet Commonly known as: COREG  Take 6.25 mg by mouth daily.   dextromethorphan -guaiFENesin  30-600 MG 12hr tablet Commonly known as: MUCINEX  DM Take 1 tablet by  mouth 2 (two) times daily as needed for up to 15 days for cough.   Eliquis  2.5 MG Tabs tablet Generic drug: apixaban  Take 1 tablet (2.5 mg total) by mouth 2 (two) times daily.   iron  polysaccharides 150 MG capsule Commonly known as: NIFEREX Take 1 capsule (150 mg total) by mouth daily.   midodrine  5 MG tablet Commonly known as: PROAMATINE  Take 5 mg by mouth as needed (post dialysis hypotension SBP<90 or DBP<55).    ondansetron  4 MG tablet Commonly known as: ZOFRAN  Take 4 mg by mouth every 6 (six) hours as needed for nausea or vomiting.   oxyCODONE -acetaminophen  5-325 MG tablet Commonly known as: PERCOCET/ROXICET Take 1-2 tablets by mouth every 4 (four) hours as needed for moderate pain (pain score 4-6).   rosuvastatin  40 MG tablet Commonly known as: CRESTOR  Take 1 tablet (40 mg total) by mouth daily.   sevelamer  carbonate 800 MG tablet Commonly known as: RENVELA  Take 2,400 mg by mouth 3 (three) times daily.   traZODone  100 MG tablet Commonly known as: DESYREL  Take 1 tablet (100 mg total) by mouth at bedtime.         Subjective   Pt reports feeling tired today as he normally does after getting HD. No specific complaints, denies pain at permcath site.   All questions and concerns were addressed at time of discharge.  Objective  Blood pressure 109/64, pulse 60, temperature 98.5 F (36.9 C), resp. rate 18, height 6' 3 (1.905 m), weight 103.6 kg, SpO2 92%.   General: Pt is alert, awake, not in acute distress Cardiovascular: RRR, S1/S2 +, no rubs, no gallops Respiratory: CTA bilaterally, no wheezing, no rhonchi Abdominal: Soft, NT, ND, bowel sounds + Extremities: no edema, no cyanosis. Right AKA  The results of significant diagnostics from this hospitalization (including imaging, microbiology, ancillary and laboratory) are listed below for reference.   Imaging studies: PERIPHERAL VASCULAR CATHETERIZATION Result Date: 09/06/2023 See surgical note for result.  ECHO TEE Result Date: 09/03/2023    TRANSESOPHOGEAL ECHO REPORT   Patient Name:   Adam Howell Date of Exam: 09/03/2023 Medical Rec #:  [id number]           Height:       75.0 in Accession #:    [id number]          Weight:       227.1 lb Date of Birth:  06/13/1960           BSA:          2.316 m Patient Age:    63 years            BP:           111/63 mmHg Patient Gender: M                   HR:           65 bpm. Exam  Location:  [facility] Procedure: Transesophageal Echo, Cardiac Doppler, Color Doppler and Saline            Contrast Bubble Study (Both Spectral and Color Flow Doppler were            utilized during procedure). Indications:     Bacteremia  History:         Patient has prior history of Echocardiogram examinations, most                  recent 09/01/2023. CHF; Risk Factors:Hypertension and Diabetes.  Chronic kidney disease, dialysis pt.  Sonographer:     Christopher Furnace Referring Phys:  [id number] SHERI HAMMOCK Diagnosing Phys: Evalene Lunger MD PROCEDURE: After discussion of the risks and benefits of a TEE, an informed consent was obtained from the patient. TEE procedure time was 30 minutes. The transesophogeal probe was passed without difficulty through the esophogus of the patient. Local oropharyngeal anesthetic was provided with Cetacaine  and viscous lidocaine . Sedation performed by different physician. Image quality was excellent. The patient's vital signs; including heart rate, blood pressure, and oxygen saturation; remained stable throughout the procedure. The patient developed no complications during the procedure.  IMPRESSIONS  1. No valve vegetation noted  2. Left ventricular ejection fraction, by estimation, is 55 to 60%. The left ventricle has normal function. The left ventricle has no regional wall motion abnormalities.  3. Right ventricular systolic function is moderately reduced. The right ventricular size is mildly enlarged. There is severely elevated pulmonary artery systolic pressure. The estimated right ventricular systolic pressure is 68.4 mmHg.  4. Left atrial size was moderately dilated. No left atrial/left atrial appendage thrombus was detected.  5. Right atrial size was moderately dilated.  6. The mitral valve is normal in structure. No evidence of mitral valve regurgitation. No evidence of mitral stenosis.  7. Tricuspid valve regurgitation is moderate.  8. The aortic valve is tricuspid.  Aortic valve regurgitation is not visualized. No aortic stenosis is present.  9. There is Moderate (Grade III) atheroma plaque involving the aortic arch and descending aorta. 10. The inferior vena cava is normal in size with greater than 50% respiratory variability, suggesting right atrial pressure of 3 mmHg. 11. Agitated saline contrast bubble study was negative, with no evidence of any interatrial shunt. Conclusion(s)/Recommendation(s): Normal biventricular function without evidence of hemodynamically significant valvular heart disease. FINDINGS  Left Ventricle: Left ventricular ejection fraction, by estimation, is 55 to 60%. The left ventricle has normal function. The left ventricle has no regional wall motion abnormalities. The left ventricular internal cavity size was normal in size. There is  no left ventricular hypertrophy. Right Ventricle: The right ventricular size is mildly enlarged. No increase in right ventricular wall thickness. Right ventricular systolic function is moderately reduced. There is severely elevated pulmonary artery systolic pressure. The tricuspid regurgitant velocity is 3.82 m/s, and with an assumed right atrial pressure of 10 mmHg, the estimated right ventricular systolic pressure is 68.4 mmHg. Left Atrium: Left atrial size was moderately dilated. No left atrial/left atrial appendage thrombus was detected. Right Atrium: Right atrial size was moderately dilated. Pericardium: There is no evidence of pericardial effusion. Mitral Valve: The mitral valve is normal in structure. No evidence of mitral valve regurgitation. No evidence of mitral valve stenosis. There is no evidence of mitral valve vegetation. Tricuspid Valve: The tricuspid valve is normal in structure. Tricuspid valve regurgitation is moderate . No evidence of tricuspid stenosis. There is no evidence of tricuspid valve vegetation. Aortic Valve: The aortic valve is tricuspid. Aortic valve regurgitation is not visualized. No  aortic stenosis is present. There is no evidence of aortic valve vegetation. Pulmonic Valve: The pulmonic valve was normal in structure. Pulmonic valve regurgitation is not visualized. No evidence of pulmonic stenosis. Aorta: The aortic root is normal in size and structure. There is moderate (Grade III) atheroma plaque involving the aortic arch and descending aorta. Venous: The inferior vena cava is normal in size with greater than 50% respiratory variability, suggesting right atrial pressure of 3 mmHg. IAS/Shunts: No atrial level shunt detected  by color flow Doppler. Agitated saline contrast was given intravenously to evaluate for intracardiac shunting. Agitated saline contrast bubble study was negative, with no evidence of any interatrial shunt. There  is no evidence of a patent foramen ovale. There is no evidence of an atrial septal defect.  TRICUSPID VALVE TR Peak grad:   58.4 mmHg TR Vmax:        382.00 cm/s Evalene Lunger MD Electronically signed by Evalene Lunger MD Signature Date/Time: 09/03/2023/1:51:45 PM    Final    ECHOCARDIOGRAM LIMITED Result Date: 09/01/2023    ECHOCARDIOGRAM LIMITED REPORT   Patient Name:   Adam Howell Date of Exam: 09/01/2023 Medical Rec #:  [id number]           Height:       75.0 in Accession #:    [id number]          Weight:       215.4 lb Date of Birth:  05/16/1960           BSA:          2.265 m Patient Age:    63 years            BP:           112/59 mmHg Patient Gender: M                   HR:           60 bpm. Exam Location:  [facility] Procedure: Limited Echo, Limited Color Doppler and Cardiac Doppler (Both            Spectral and Color Flow Doppler were utilized during procedure). Indications:     Bacteremia  History:         Patient has prior history of Echocardiogram examinations, most                  recent 08/09/2023. CHF, CAD, end stage renal disease,                  Arrythmias:Atrial Fibrillation; Risk Factors:Dyslipidemia,                  Hypertension and  Diabetes.  Sonographer:     Tinnie Barefoot RDCS Referring Phys:  [id number] ANTHONY CHRISTELLA POUCH Diagnosing Phys: Evalene Lunger MD IMPRESSIONS  1. Left ventricular ejection fraction, by estimation, is 60 to 65%. The left ventricle has normal function. The left ventricle has no regional wall motion abnormalities. There is mild left ventricular hypertrophy.  2. Right ventricular systolic function is mildly reduced. The right ventricular size is moderately enlarged. There is severely elevated pulmonary artery systolic pressure. The estimated right ventricular systolic pressure is 64.0 mmHg.  3. The mitral valve is normal in structure. Mild mitral valve regurgitation. No evidence of mitral stenosis.  4. Tricuspid valve regurgitation is moderate.  5. The aortic valve is normal in structure. There is moderate calcification of the aortic valve. Aortic valve regurgitation is not visualized. Aortic valve sclerosis/calcification is present, without any evidence of aortic stenosis.  6. The inferior vena cava is dilated in size with <50% respiratory variability, suggesting right atrial pressure of 15 mmHg. FINDINGS  Left Ventricle: Left ventricular ejection fraction, by estimation, is 60 to 65%. The left ventricle has normal function. The left ventricle has no regional wall motion abnormalities. The left ventricular internal cavity size was normal in size. There is  mild left ventricular hypertrophy. Right Ventricle: The right ventricular size is moderately  enlarged. No increase in right ventricular wall thickness. Right ventricular systolic function is mildly reduced. There is severely elevated pulmonary artery systolic pressure. The tricuspid regurgitant velocity is 3.50 m/s, and with an assumed right atrial pressure of 15 mmHg, the estimated right ventricular systolic pressure is 64.0 mmHg. Left Atrium: Left atrial size was normal in size. Right Atrium: Right atrial size was normal in size. Pericardium: There is no evidence  of pericardial effusion. Mitral Valve: The mitral valve is normal in structure. Mild mitral annular calcification. Mild mitral valve regurgitation. No evidence of mitral valve stenosis. There is no evidence of mitral valve vegetation. Tricuspid Valve: The tricuspid valve is normal in structure. Tricuspid valve regurgitation is moderate . No evidence of tricuspid stenosis. There is no evidence of tricuspid valve vegetation. Aortic Valve: The aortic valve is normal in structure. There is moderate calcification of the aortic valve. Aortic valve regurgitation is not visualized. Aortic valve sclerosis/calcification is present, without any evidence of aortic stenosis. There is no evidence of aortic valve vegetation. Pulmonic Valve: The pulmonic valve was normal in structure. Pulmonic valve regurgitation is not visualized. No evidence of pulmonic stenosis. There is no evidence of pulmonic valve vegetation. Aorta: The aortic root is normal in size and structure. Venous: The inferior vena cava is dilated in size with less than 50% respiratory variability, suggesting right atrial pressure of 15 mmHg. IAS/Shunts: No atrial level shunt detected by color flow Doppler. Additional Comments: Spectral Doppler performed. Color Doppler performed.  LEFT VENTRICLE PLAX 2D LVIDd:         4.20 cm LVIDs:         2.70 cm LV PW:         1.50 cm LV IVS:        1.30 cm  IVC IVC diam: 2.80 cm LEFT ATRIUM         Index LA diam:    4.50 cm 1.99 cm/m  AORTIC VALVE LVOT Vmax:   123.00 cm/s LVOT Vmean:  65.700 cm/s LVOT VTI:    0.217 m  AORTA Ao Asc diam: 3.40 cm MITRAL VALVE                TRICUSPID VALVE MV Area (PHT): 4.24 cm     TR Peak grad:   49.0 mmHg MV Decel Time: 179 msec     TR Vmax:        350.00 cm/s MV E velocity: 124.00 cm/s MV A velocity: 29.60 cm/s   SHUNTS MV E/A ratio:  4.19         Systemic VTI: 0.22 m Evalene Lunger MD Electronically signed by Evalene Lunger MD Signature Date/Time: 09/01/2023/2:40:38 PM    Final (Updated)     PERIPHERAL VASCULAR CATHETERIZATION Result Date: 08/31/2023 See surgical note for result.  CT Chest Wo Contrast Result Date: 08/29/2023 CLINICAL DATA:  Respiratory illness, nondiagnostic xray recent hemothorax, new respiratory symptoms, short of breath, recent history of sepsis EXAM: CT CHEST WITHOUT CONTRAST TECHNIQUE: Multidetector CT imaging of the chest was performed following the standard protocol without IV contrast. RADIATION DOSE REDUCTION: This exam was performed according to the departmental dose-optimization program which includes automated exposure control, adjustment of the mA and/or kV according to patient size and/or use of iterative reconstruction technique. COMPARISON:  08/11/2023, 08/05/2023 FINDINGS: Cardiovascular: Unenhanced imaging of the heart demonstrates stable cardiomegaly without pericardial effusion. Normal caliber of the thoracic aorta. Atherosclerosis of the aorta and coronary vasculature. Mediastinum/Nodes: Borderline enlarged mediastinal lymph nodes, largest measuring 10 mm in  the right paratracheal region. Thyroid , trachea, and esophagus are unremarkable. Lungs/Pleura: Scattered nodular ground-glass airspace disease is seen bilaterally, greatest within the lower lobes. Findings are consistent with infection, aspiration, or hemorrhage. No effusion or pneumothorax. The central airways are patent. Upper Abdomen: Central venous catheter from an inferior approach, tip at the atriocaval junction. No acute upper abdominal findings. Musculoskeletal: No acute or destructive bony abnormalities. Reconstructed images demonstrate no additional findings. IMPRESSION: 1. Scattered bilateral nodular ground-glass airspace disease, greatest in the lower lobes, favor bilateral pneumonia given clinical presentation. 2. Cardiomegaly. 3. Borderline mediastinal adenopathy, likely reactive. 4. Aortic Atherosclerosis (ICD10-I70.0). Coronary artery atherosclerosis. Electronically Signed   By: Ozell Daring M.D.   On: 08/29/2023 20:38   PERIPHERAL VASCULAR CATHETERIZATION Result Date: 08/12/2023 See surgical note for result.  DG Chest Port 1 View Result Date: 08/11/2023 CLINICAL DATA:  Hemodialysis fatigue EXAM: PORTABLE CHEST 1 VIEW COMPARISON:  08/10/2023 FINDINGS: Cardiomegaly. No acute airspace disease, pleural effusion or pneumothorax. Minimal atelectasis left base. IMPRESSION: No active disease. Cardiomegaly. Electronically Signed   By: Luke Bun M.D.   On: 08/11/2023 18:59   DG Chest Port 1 View Result Date: 08/10/2023 CLINICAL DATA:  Pleural effusion. EXAM: PORTABLE CHEST 1 VIEW COMPARISON:  08/09/2023 FINDINGS: The cardio pericardial silhouette is enlarged. Basilar atelectasis bilaterally with improved aeration in the retrocardiac left base compared to prior. No acute bony abnormality. Telemetry leads overlie the chest. Trace soft tissue gas seen in the right supraclavicular region previously has resolved. IMPRESSION: Basilar atelectasis bilaterally with improved aeration in the retrocardiac left base compared to prior. Electronically Signed   By: Camellia Candle M.D.   On: 08/10/2023 07:34   ECHOCARDIOGRAM COMPLETE Result Date: 08/09/2023    ECHOCARDIOGRAM REPORT   Patient Name:   Adam Howell Date of Exam: 08/09/2023 Medical Rec #:  [id number]           Height:       72.0 in Accession #:    [id number]          Weight:       239.9 lb Date of Birth:  June 23, 1960           BSA:          2.302 m Patient Age:    63 years            BP:           95/54 mmHg Patient Gender: M                   HR:           57 bpm. Exam Location:  [facility] Procedure: 2D Echo, Color Doppler and Cardiac Doppler (Both Spectral and Color            Flow Doppler were utilized during procedure). Indications:     R57.9 Shock  History:         Patient has prior history of Echocardiogram examinations. ESRD,                  Arrythmias:Atrial Fibrillation; Risk Factors:Diabetes,                  Hypertension and  Dyslipidemia.  Sonographer:     L. Thornton-Maynard Referring Phys:  [id number] INGE JONETTA LECHER Diagnosing Phys: Darryle Decent MD IMPRESSIONS  1. Left ventricular ejection fraction, by estimation, is 55 to 60%. The left ventricle has normal function. The left ventricle has no regional wall motion abnormalities. There is  severe concentric left ventricular hypertrophy. Left ventricular diastolic  function could not be evaluated.  2. Right ventricular systolic function is moderately reduced. The right ventricular size is moderately enlarged. There is mildly elevated pulmonary artery systolic pressure. The estimated right ventricular systolic pressure is 41.4 mmHg.  3. Left atrial size was severely dilated.  4. Right atrial size was moderately dilated.  5. The mitral valve is grossly normal. Trivial mitral valve regurgitation. No evidence of mitral stenosis.  6. The aortic valve is tricuspid. Aortic valve regurgitation is not visualized. Aortic valve sclerosis/calcification is present, without any evidence of aortic stenosis.  7. The inferior vena cava is normal in size with greater than 50% respiratory variability, suggesting right atrial pressure of 3 mmHg. Comparison(s): Changes from prior study are noted. Severe concentric LVH up to 2.0 cm. LVEF is normal and improved from prior. Moderate RV dilation with moderately reduced function. Severe LVH may be related to hypertensive heart disease per chart review, but would consider work-up for infiltrative cardiomyopathy such as cardiac amyloidosis. FINDINGS  Left Ventricle: Left ventricular ejection fraction, by estimation, is 55 to 60%. The left ventricle has normal function. The left ventricle has no regional wall motion abnormalities. The left ventricular internal cavity size was normal in size. There is  severe concentric left ventricular hypertrophy. Left ventricular diastolic function could not be evaluated due to atrial fibrillation. Left ventricular diastolic  function could not be evaluated. Right Ventricle: The right ventricular size is moderately enlarged. No increase in right ventricular wall thickness. Right ventricular systolic function is moderately reduced. There is mildly elevated pulmonary artery systolic pressure. The tricuspid regurgitant velocity is 3.10 m/s, and with an assumed right atrial pressure of 3 mmHg, the estimated right ventricular systolic pressure is 41.4 mmHg. Left Atrium: Left atrial size was severely dilated. Right Atrium: Right atrial size was moderately dilated. Pericardium: Trivial pericardial effusion is present. Mitral Valve: The mitral valve is grossly normal. Trivial mitral valve regurgitation. No evidence of mitral valve stenosis. Tricuspid Valve: The tricuspid valve is grossly normal. Tricuspid valve regurgitation is mild . No evidence of tricuspid stenosis. Aortic Valve: The aortic valve is tricuspid. Aortic valve regurgitation is not visualized. Aortic valve sclerosis/calcification is present, without any evidence of aortic stenosis. Aortic valve mean gradient measures 4.7 mmHg. Aortic valve peak gradient measures 9.7 mmHg. Aortic valve area, by VTI measures 1.92 cm. Pulmonic Valve: The pulmonic valve was grossly normal. Pulmonic valve regurgitation is not visualized. No evidence of pulmonic stenosis. Aorta: The aortic root and ascending aorta are structurally normal, with no evidence of dilitation. Venous: The inferior vena cava is normal in size with greater than 50% respiratory variability, suggesting right atrial pressure of 3 mmHg. IAS/Shunts: The atrial septum is grossly normal.  LEFT VENTRICLE PLAX 2D LVIDd:         4.00 cm     Diastology LVIDs:         3.00 cm     LV e' medial:    5.19 cm/s LV PW:         2.00 cm     LV E/e' medial:  13.0 LV IVS:        2.00 cm     LV e' lateral:   5.43 cm/s LVOT diam:     2.20 cm     LV E/e' lateral: 12.4 LV SV:         41 LV SV Index:   18 LVOT Area:     3.80 cm  LV Volumes (MOD) LV vol  d, MOD A2C: 57.9 ml LV vol d, MOD A4C: 62.5 ml LV vol s, MOD A2C: 21.8 ml LV vol s, MOD A4C: 19.0 ml LV SV MOD A2C:     36.1 ml LV SV MOD A4C:     62.5 ml LV SV MOD BP:      39.9 ml RIGHT VENTRICLE             IVC RV Basal diam:  4.00 cm     IVC diam: 1.30 cm RV S prime:     10.60 cm/s TAPSE (M-mode): 1.0 cm LEFT ATRIUM              Index        RIGHT ATRIUM           Index LA diam:        4.80 cm  2.09 cm/m   RA Area:     25.80 cm LA Vol (A2C):   147.0 ml 63.87 ml/m  RA Volume:   87.30 ml  37.93 ml/m LA Vol (A4C):   113.0 ml 49.10 ml/m LA Biplane Vol: 131.0 ml 56.92 ml/m  AORTIC VALVE                    PULMONIC VALVE AV Area (Vmax):    1.95 cm     PV Vmax:       1.19 m/s AV Area (Vmean):   2.03 cm     PV Peak grad:  5.7 mmHg AV Area (VTI):     1.92 cm AV Vmax:           155.33 cm/s AV Vmean:          97.967 cm/s AV VTI:            0.214 m AV Peak Grad:      9.7 mmHg AV Mean Grad:      4.7 mmHg LVOT Vmax:         79.80 cm/s LVOT Vmean:        52.300 cm/s LVOT VTI:          0.108 m LVOT/AV VTI ratio: 0.50  AORTA Ao Root diam: 3.40 cm Ao Asc diam:  3.20 cm MITRAL VALVE               TRICUSPID VALVE MV Area (PHT): 3.06 cm    TR Peak grad:   38.4 mmHg MV Decel Time: 248 msec    TR Vmax:        310.00 cm/s MV E velocity: 67.30 cm/s                            SHUNTS                            Systemic VTI:  0.11 m                            Systemic Diam: 2.20 cm Darryle Decent MD Electronically signed by Darryle Decent MD Signature Date/Time: 08/09/2023/2:02:31 PM    Final    DG Chest Port 1 View Result Date: 08/09/2023 CLINICAL DATA:  End-stage renal disease. EXAM: PORTABLE CHEST 1 VIEW COMPARISON:  08/08/2023 FINDINGS: The cardio pericardial silhouette is enlarged. Left base collapse/consolidation appears progressive in the interval. No overt pulmonary edema or substantial pleural effusion.  No acute bony abnormality. Telemetry leads overlie the chest. Trace soft tissue gas again seen in the right  supraclavicular region. IMPRESSION: Progressive left base collapse/consolidation. No evidence for pneumothorax with persistent minimal soft tissue gas in the right supraclavicular region. Electronically Signed   By: Camellia Candle M.D.   On: 08/09/2023 09:16   DG Chest Port 1 View Result Date: 08/08/2023 CLINICAL DATA:  Chest tube removal. EXAM: PORTABLE CHEST 1 VIEW COMPARISON:  08/08/2023 at 5:53 a.m. FINDINGS: Interval removal of nasogastric tube, enteric tube and right-sided chest tube. A catheter remains in place with tip projecting over the right atrium unchanged. Lungs are adequately inflated and otherwise clear. No evidence of right-sided pneumothorax. Cardiomediastinal silhouette and remainder of the exam is unchanged. IMPRESSION: Interval removal of right-sided chest tube without evidence of right-sided pneumothorax. Electronically Signed   By: Toribio Agreste M.D.   On: 08/08/2023 11:14    Labs: Basic Metabolic Panel: Recent Labs  Lab 09/01/23 0416 09/03/23 0417 09/04/23 0437 09/06/23 0323 09/07/23 0403  NA 133* 135 134* 133* 134*  K 4.3 4.7 4.2 5.0 4.1  CL 94* 94* 97* 97* 97*  CO2 27 28 26 23 28   GLUCOSE 100* 142* 102* 102* 105*  BUN 41* 60* 33* 48* 28*  CREATININE 9.14* 12.41* 7.94* 11.08* 7.28*  CALCIUM  8.2* 8.1* 8.3* 8.1* 8.0*  PHOS  --  5.0* 3.4 3.7 3.2   CBC: Recent Labs  Lab 09/01/23 0416 09/03/23 0417 09/04/23 0437 09/06/23 0323 09/07/23 0403  WBC 4.5 4.6 4.1 5.0 5.6  HGB 8.0* 7.7* 8.3* 8.6* 8.3*  HCT 26.4* 24.2* 27.5* 29.0* 26.7*  MCV 90.7 92.4 90.5 90.3 90.5  PLT 91* 158 211 225 242   Microbiology: Results for orders placed or performed during the hospital encounter of 08/29/23  Resp panel by RT-PCR (RSV, Flu A&B, Covid) Anterior Nasal Swab     Status: None   Collection Time: 08/29/23  8:00 PM   Specimen: Anterior Nasal Swab  Result Value Ref Range Status   SARS Coronavirus 2 by RT PCR NEGATIVE NEGATIVE Final    Comment: (NOTE) SARS-CoV-2 target  nucleic acids are NOT DETECTED.  The SARS-CoV-2 RNA is generally detectable in upper respiratory specimens during the acute phase of infection. The lowest concentration of SARS-CoV-2 viral copies this assay can detect is 138 copies/mL. A negative result does not preclude SARS-Cov-2 infection and should not be used as the sole basis for treatment or other patient management decisions. A negative result may occur with  improper specimen collection/handling, submission of specimen other than nasopharyngeal swab, presence of viral mutation(s) within the areas targeted by this assay, and inadequate number of viral copies(<138 copies/mL). A negative result must be combined with clinical observations, patient history, and epidemiological information. The expected result is Negative.  Fact Sheet for Patients:  BloggerCourse.com  Fact Sheet for Healthcare Providers:  SeriousBroker.it  This test is no t yet approved or cleared by the [place]  FDA and  has been authorized for detection and/or diagnosis of SARS-CoV-2 by FDA under an Emergency Use Authorization (EUA). This EUA will remain  in effect (meaning this test can be used) for the duration of the COVID-19 declaration under Section 564(b)(1) of the Act, 21 U.S.C.section 360bbb-3(b)(1), unless the authorization is terminated  or revoked sooner.       Influenza A by PCR NEGATIVE NEGATIVE Final   Influenza B by PCR NEGATIVE NEGATIVE Final    Comment: (NOTE) The Xpert Xpress SARS-CoV-2/FLU/RSV plus assay is intended  as an aid in the diagnosis of influenza from Nasopharyngeal swab specimens and should not be used as a sole basis for treatment. Nasal washings and aspirates are unacceptable for Xpert Xpress SARS-CoV-2/FLU/RSV testing.  Fact Sheet for Patients: BloggerCourse.com  Fact Sheet for Healthcare  Providers: SeriousBroker.it  This test is not yet approved or cleared by the [place]  FDA and has been authorized for detection and/or diagnosis of SARS-CoV-2 by FDA under an Emergency Use Authorization (EUA). This EUA will remain in effect (meaning this test can be used) for the duration of the COVID-19 declaration under Section 564(b)(1) of the Act, 21 U.S.C. section 360bbb-3(b)(1), unless the authorization is terminated or revoked.     Resp Syncytial Virus by PCR NEGATIVE NEGATIVE Final    Comment: (NOTE) Fact Sheet for Patients: BloggerCourse.com  Fact Sheet for Healthcare Providers: SeriousBroker.it  This test is not yet approved or cleared by the [place]  FDA and has been authorized for detection and/or diagnosis of SARS-CoV-2 by FDA under an Emergency Use Authorization (EUA). This EUA will remain in effect (meaning this test can be used) for the duration of the COVID-19 declaration under Section 564(b)(1) of the Act, 21 U.S.C. section 360bbb-3(b)(1), unless the authorization is terminated or revoked.  Performed at [facility], [address] [place], [place] [address]   Blood Culture (routine x 2)     Status: Abnormal   Collection Time: 08/30/23 12:06 AM   Specimen: BLOOD RIGHT HAND  Result Value Ref Range Status   Specimen Description   Final    BLOOD RIGHT HAND Performed at [facility], [address], [place], [place] [address]    Special Requests   Final    BOTTLES DRAWN AEROBIC AND ANAEROBIC Blood Culture results may not be optimal due to an inadequate volume of blood received in culture bottles Performed at [facility], [address] [place], [place] [address]    Culture  Setup Time   Final    GRAM POSITIVE COCCI IN BOTH AEROBIC AND ANAEROBIC BOTTLES CRITICAL RESULT CALLED TO, READ BACK BY AND VERIFIED WITH: MOSE BLEW 08/30/23 1230  MW Performed at [facility], [address] [place], [place] [address]    Culture (A)  Final    STAPHYLOCOCCUS AUREUS SUSCEPTIBILITIES PERFORMED ON PREVIOUS CULTURE WITHIN THE LAST 5 DAYS. Performed at [facility], [address] [place], [place] [address]    Report Status 09/01/2023 FINAL  Final  Blood Culture (routine x 2)     Status: Abnormal   Collection Time: 08/30/23 12:06 AM   Specimen: BLOOD RIGHT ARM  Result Value Ref Range Status   Specimen Description   Final    BLOOD RIGHT ARM Performed at [facility], [address], [place], [place] [address]    Special Requests   Final    BOTTLES DRAWN AEROBIC AND ANAEROBIC Blood Culture results may not be optimal due to an inadequate volume of blood received in culture bottles Performed at [facility], [address] [place], [place] [address]    Culture  Setup Time   Final    GRAM POSITIVE COCCI IN BOTH AEROBIC AND ANAEROBIC BOTTLES CRITICAL RESULT CALLED TO, READ BACK BY AND VERIFIED WITH: TREY GREENWOOD 08/30/23 1230 MW GRAM POSITIVE RODS ANAEROBIC BOTTLE ONLY CRITICAL RESULT CALLED TO, READ BACK BY AND VERIFIED WITH: PHARMD W. Marelly Wehrman [id number] @1700  FH Performed at [facility], [address], [place], [place] [address]  Culture (A)  Final    METHICILLIN RESISTANT STAPHYLOCOCCUS AUREUS CLOSTRIDIUM PERFRINGENS    Report Status 09/01/2023 FINAL  Final   Organism ID, Bacteria METHICILLIN RESISTANT STAPHYLOCOCCUS AUREUS  Final      Susceptibility   Methicillin resistant staphylococcus aureus - MIC*    CIPROFLOXACIN >=8 RESISTANT Resistant     ERYTHROMYCIN >=8 RESISTANT Resistant     GENTAMICIN <=0.5 SENSITIVE Sensitive     OXACILLIN >=4 RESISTANT Resistant     TETRACYCLINE >=16 RESISTANT Resistant     VANCOMYCIN  1 SENSITIVE Sensitive     TRIMETH /SULFA  <=10 SENSITIVE Sensitive     CLINDAMYCIN >=8 RESISTANT Resistant     RIFAMPIN <=0.5 SENSITIVE Sensitive     Inducible  Clindamycin NEGATIVE Sensitive     LINEZOLID  2 SENSITIVE Sensitive     * METHICILLIN RESISTANT STAPHYLOCOCCUS AUREUS  Blood Culture ID Panel (Reflexed)     Status: Abnormal   Collection Time: 08/30/23 12:06 AM  Result Value Ref Range Status   Enterococcus faecalis NOT DETECTED NOT DETECTED Final   Enterococcus Faecium NOT DETECTED NOT DETECTED Final   Listeria monocytogenes NOT DETECTED NOT DETECTED Final   Staphylococcus species DETECTED (A) NOT DETECTED Final    Comment: CRITICAL RESULT CALLED TO, READ BACK BY AND VERIFIED WITH: TREY GREENWOOD 08/30/23 1230 MW    Staphylococcus aureus (BCID) DETECTED (A) NOT DETECTED Final    Comment: Methicillin (oxacillin)-resistant Staphylococcus aureus (MRSA). MRSA is predictably resistant to beta-lactam antibiotics (except ceftaroline). Preferred therapy is vancomycin  unless clinically contraindicated. Patient requires contact precautions if  hospitalized. CRITICAL RESULT CALLED TO, READ BACK BY AND VERIFIED WITH: TREY GREENWOOD 08/30/23 1230 MW    Staphylococcus epidermidis NOT DETECTED NOT DETECTED Final   Staphylococcus lugdunensis NOT DETECTED NOT DETECTED Final   Streptococcus species NOT DETECTED NOT DETECTED Final   Streptococcus agalactiae NOT DETECTED NOT DETECTED Final   Streptococcus pneumoniae NOT DETECTED NOT DETECTED Final   Streptococcus pyogenes NOT DETECTED NOT DETECTED Final   A.calcoaceticus-baumannii NOT DETECTED NOT DETECTED Final   Bacteroides fragilis NOT DETECTED NOT DETECTED Final   Enterobacterales NOT DETECTED NOT DETECTED Final   Enterobacter cloacae complex NOT DETECTED NOT DETECTED Final   Escherichia coli NOT DETECTED NOT DETECTED Final   Klebsiella aerogenes NOT DETECTED NOT DETECTED Final   Klebsiella oxytoca NOT DETECTED NOT DETECTED Final   Klebsiella pneumoniae NOT DETECTED NOT DETECTED Final   Proteus species NOT DETECTED NOT DETECTED Final   Salmonella species NOT DETECTED NOT DETECTED Final   Serratia  marcescens NOT DETECTED NOT DETECTED Final   Haemophilus influenzae NOT DETECTED NOT DETECTED Final   Neisseria meningitidis NOT DETECTED NOT DETECTED Final   Pseudomonas aeruginosa NOT DETECTED NOT DETECTED Final   Stenotrophomonas maltophilia NOT DETECTED NOT DETECTED Final   Candida albicans NOT DETECTED NOT DETECTED Final   Candida auris NOT DETECTED NOT DETECTED Final   Candida glabrata NOT DETECTED NOT DETECTED Final   Candida krusei NOT DETECTED NOT DETECTED Final   Candida parapsilosis NOT DETECTED NOT DETECTED Final   Candida tropicalis NOT DETECTED NOT DETECTED Final   Cryptococcus neoformans/gattii NOT DETECTED NOT DETECTED Final   Meth resistant mecA/C and MREJ DETECTED (A) NOT DETECTED Final    Comment: CRITICAL RESULT CALLED TO, READ BACK BY AND VERIFIED WITH: MOSE BLEW 08/30/23 1230 MW Performed at [facility], [address] [place], [place] [address]   MRSA Next Gen by PCR, Nasal     Status: Abnormal   Collection Time: 08/30/23  2:53 PM  Specimen: Nasal Mucosa; Nasal Swab  Result Value Ref Range Status   MRSA by PCR Next Gen DETECTED (A) NOT DETECTED Final    Comment: CRITICAL RESULT CALLED TO, READ BACK BY AND VERIFIED WITH:  ZADA GARRISON RN 08/30/2023 @1624  KKG (NOTE) The GeneXpert MRSA Assay (FDA approved for NASAL specimens only), is one component of a comprehensive MRSA colonization surveillance program. It is not intended to diagnose MRSA infection nor to guide or monitor treatment for MRSA infections. Test performance is not FDA approved in patients less than 35 years old. Performed at [facility], [address], [place], [place] [address]   Cath Tip Culture     Status: None   Collection Time: 08/31/23  8:50 AM   Specimen: Catheter Tip; Other  Result Value Ref Range Status   Specimen Description   Final    CATH TIP Performed at [facility], [address], [place], [place] [address]    Special Requests   Final     NONE Performed at [facility], [address] [place], [place] [address]    Culture   Final    NO GROWTH 2 DAYS Performed at [facility], [address], [place], [place] [address]    Report Status 09/02/2023 FINAL  Final  Culture, blood (single) w Reflex to ID Panel     Status: None   Collection Time: 08/31/23 10:12 AM   Specimen: BLOOD  Result Value Ref Range Status   Specimen Description BLOOD RIGHT FA  Final   Special Requests   Final    BOTTLES DRAWN AEROBIC AND ANAEROBIC Blood Culture adequate volume   Culture   Final    NO GROWTH 5 DAYS Performed at [facility], [address] [place], [place] [address]    Report Status 09/05/2023 FINAL  Final  Aerobic/Anaerobic Culture w Gram Stain (surgical/deep wound)     Status: None   Collection Time: 09/01/23 12:21 PM   Specimen: Path Tissue  Result Value Ref Range Status   Specimen Description   Final    TISSUE Performed at [facility], [address], [place], [place] [address]    Special Requests   Final    NONE Performed at [facility], [address] [place], [place] [address]    Gram Stain NO WBC SEEN NO ORGANISMS SEEN   Final   Culture   Final    FEW METHICILLIN RESISTANT STAPHYLOCOCCUS AUREUS NO ANAEROBES ISOLATED Performed at [facility], [address], [place], [place] [address]    Report Status 09/06/2023 FINAL  Final   Organism ID, Bacteria METHICILLIN RESISTANT STAPHYLOCOCCUS AUREUS  Final      Susceptibility   Methicillin resistant staphylococcus aureus - MIC*    CIPROFLOXACIN >=8 RESISTANT Resistant     ERYTHROMYCIN >=8 RESISTANT Resistant     GENTAMICIN <=0.5 SENSITIVE Sensitive     OXACILLIN >=4 RESISTANT Resistant     TETRACYCLINE >=16 RESISTANT Resistant     VANCOMYCIN  1 SENSITIVE Sensitive     TRIMETH /SULFA  <=10 SENSITIVE Sensitive     CLINDAMYCIN >=8 RESISTANT Resistant     RIFAMPIN <=0.5 SENSITIVE Sensitive     Inducible Clindamycin  NEGATIVE Sensitive     LINEZOLID  2 SENSITIVE Sensitive     * FEW METHICILLIN RESISTANT STAPHYLOCOCCUS AUREUS  Culture, blood (Routine X 2) w Reflex to ID Panel     Status: None   Collection Time: 09/02/23  8:08 AM   Specimen:  BLOOD  Result Value Ref Range Status   Specimen Description BLOOD RIGHT ANTECUBITAL  Final   Special Requests   Final    BOTTLES DRAWN AEROBIC AND ANAEROBIC Blood Culture adequate volume   Culture   Final    NO GROWTH 5 DAYS Performed at [facility], [address] [place], [place] [address]    Report Status 09/07/2023 FINAL  Final  Culture, blood (Routine X 2) w Reflex to ID Panel     Status: None   Collection Time: 09/02/23  8:08 AM   Specimen: BLOOD  Result Value Ref Range Status   Specimen Description BLOOD BLOOD RIGHT FOREARM  Final   Special Requests   Final    BOTTLES DRAWN AEROBIC AND ANAEROBIC Blood Culture adequate volume   Culture   Final    NO GROWTH 5 DAYS Performed at [facility], [address], [place], [place] [address]    Report Status 09/07/2023 FINAL  Final  Expectorated Sputum Assessment w Gram Stain, Rflx to Resp Cult     Status: None   Collection Time: 09/04/23  3:13 AM   Specimen: Expectorated Sputum  Result Value Ref Range Status   Specimen Description EXPECTORATED SPUTUM  Final   Special Requests NONE  Final   Sputum evaluation   Final    Sputum specimen not acceptable for testing.  Please recollect.   RESULT CALLED TO, READ BACK BY AND VERIFIED WITH: MARCELITA WINT @ 09/04/23 0448 AB Performed at [facility], [address] [place], [place] [address]    Report Status 09/04/2023 FINAL  Final    Time coordinating discharge: Over 30 minutes  [name], MD  [facility] 09/07/2023, 10:32 AM

## 2023-09-07 NOTE — Progress Notes (Signed)
 Pt. Is A/O x4. EMS is at bedside to transport pt. home. Daughter will be at the house ready to receive pt. Daughter called and informed pt. will be arriving shortly. VS WDL. Pt. Adam Howell any pain.

## 2023-09-08 ENCOUNTER — Telehealth: Payer: Self-pay

## 2023-09-08 DIAGNOSIS — E11621 Type 2 diabetes mellitus with foot ulcer: Secondary | ICD-10-CM | POA: Diagnosis not present

## 2023-09-08 DIAGNOSIS — Z992 Dependence on renal dialysis: Secondary | ICD-10-CM | POA: Diagnosis not present

## 2023-09-08 DIAGNOSIS — Z556 Problems related to health literacy: Secondary | ICD-10-CM | POA: Diagnosis not present

## 2023-09-08 DIAGNOSIS — E1122 Type 2 diabetes mellitus with diabetic chronic kidney disease: Secondary | ICD-10-CM | POA: Diagnosis not present

## 2023-09-08 DIAGNOSIS — D631 Anemia in chronic kidney disease: Secondary | ICD-10-CM | POA: Diagnosis not present

## 2023-09-08 DIAGNOSIS — I87312 Chronic venous hypertension (idiopathic) with ulcer of left lower extremity: Secondary | ICD-10-CM | POA: Diagnosis not present

## 2023-09-08 DIAGNOSIS — L97522 Non-pressure chronic ulcer of other part of left foot with fat layer exposed: Secondary | ICD-10-CM | POA: Diagnosis not present

## 2023-09-08 DIAGNOSIS — I509 Heart failure, unspecified: Secondary | ICD-10-CM | POA: Diagnosis not present

## 2023-09-08 DIAGNOSIS — Z7901 Long term (current) use of anticoagulants: Secondary | ICD-10-CM | POA: Diagnosis not present

## 2023-09-08 DIAGNOSIS — L97821 Non-pressure chronic ulcer of other part of left lower leg limited to breakdown of skin: Secondary | ICD-10-CM | POA: Diagnosis not present

## 2023-09-08 DIAGNOSIS — N186 End stage renal disease: Secondary | ICD-10-CM | POA: Diagnosis not present

## 2023-09-08 DIAGNOSIS — E785 Hyperlipidemia, unspecified: Secondary | ICD-10-CM | POA: Diagnosis not present

## 2023-09-08 DIAGNOSIS — Z89611 Acquired absence of right leg above knee: Secondary | ICD-10-CM | POA: Diagnosis not present

## 2023-09-08 DIAGNOSIS — I132 Hypertensive heart and chronic kidney disease with heart failure and with stage 5 chronic kidney disease, or end stage renal disease: Secondary | ICD-10-CM | POA: Diagnosis not present

## 2023-09-08 DIAGNOSIS — Z993 Dependence on wheelchair: Secondary | ICD-10-CM | POA: Diagnosis not present

## 2023-09-08 NOTE — Telephone Encounter (Signed)
 Copied from CRM [id number]. Topic: Clinical - Home Health Verbal Orders >> Sep 08, 2023  2:32 PM Ivette P wrote: Caller/Agency: Hetty Rushing Number: [phone] - secured Line  Service Requested: Occupational Therapy, Physical Therapy, and Skilled Nursing Frequency: resumed, due to being out of the hospital.  Any new concerns about the patient? No

## 2023-09-08 NOTE — Telephone Encounter (Signed)
 Called and gave verbal orders per Jolene.

## 2023-09-09 ENCOUNTER — Other Ambulatory Visit: Payer: Self-pay | Admitting: Nurse Practitioner

## 2023-09-09 NOTE — Telephone Encounter (Signed)
 Copied from CRM [id number]. Topic: Clinical - Medication Refill >> Sep 09, 2023  1:21 PM Santiya F wrote: Medication: traZODone  (DESYREL ) 100 MG tablet [[id number]], HYDROcodone -acetaminophen  (NORCO) 10-325 MG tablet [[id number]]  Has the patient contacted their pharmacy? Yes  (Agent: If yes, when and what did the pharmacy advise?) Contact office   This is the patient's preferred pharmacy:   [facility] [address] ([address]), [place] - [address] [place]) [place] [address] Phone: [phone] Fax: [phone]   Is this the correct pharmacy for this prescription? Yes If no, delete pharmacy and type the correct one.   Has the prescription been filled recently? Yes  Is the patient out of the medication? Yes  Has the patient been seen for an appointment in the last year OR does the patient have an upcoming appointment? Yes  Can we respond through MyChart? No  Agent: Please be advised that Rx refills may take up to 3 business days. We ask that you follow-up with your pharmacy.   Patient's daughter called and stated that patient had an appointment scheduled for Friday morning but it was rescheduled because he has dialysis on Fridays. Patient is coming in next week and she states patient is in pain, and she wanted to know if his hydrocodone  was able to be refilled. She is requesting a call back.

## 2023-09-10 ENCOUNTER — Inpatient Hospital Stay: Admitting: Nurse Practitioner

## 2023-09-10 DIAGNOSIS — N186 End stage renal disease: Secondary | ICD-10-CM | POA: Diagnosis not present

## 2023-09-10 DIAGNOSIS — A499 Bacterial infection, unspecified: Secondary | ICD-10-CM | POA: Diagnosis not present

## 2023-09-10 DIAGNOSIS — N2581 Secondary hyperparathyroidism of renal origin: Secondary | ICD-10-CM | POA: Diagnosis not present

## 2023-09-10 DIAGNOSIS — E1129 Type 2 diabetes mellitus with other diabetic kidney complication: Secondary | ICD-10-CM | POA: Diagnosis not present

## 2023-09-10 DIAGNOSIS — D689 Coagulation defect, unspecified: Secondary | ICD-10-CM | POA: Diagnosis not present

## 2023-09-10 DIAGNOSIS — L299 Pruritus, unspecified: Secondary | ICD-10-CM | POA: Diagnosis not present

## 2023-09-10 DIAGNOSIS — Z992 Dependence on renal dialysis: Secondary | ICD-10-CM | POA: Diagnosis not present

## 2023-09-10 DIAGNOSIS — R52 Pain, unspecified: Secondary | ICD-10-CM | POA: Diagnosis not present

## 2023-09-10 NOTE — Telephone Encounter (Signed)
 Requested medications are due for refill today.  Trazodone  is not due for refill. Pt is also requesting a refill of Norco 10-325  Requested medications are on the active medications list.  Trazodone  is, Norco is not  Last refill. Trazodone  01/12/2023 #90 4 rf, Norco 08/16/2023  Future visit scheduled.   yes  Notes to clinic.  Trazodone  is not due. Norco is not on med list. Rx was signed by another provider..     Requested Prescriptions  Pending Prescriptions Disp Refills   traZODone  (DESYREL ) 100 MG tablet 90 tablet 4    Sig: Take 1 tablet (100 mg total) by mouth at bedtime.     Psychiatry: Antidepressants - Serotonin Modulator Failed - 09/10/2023  1:47 PM      Failed - Valid encounter within last 6 months    Recent Outpatient Visits   None

## 2023-09-11 NOTE — Patient Instructions (Signed)

## 2023-09-13 ENCOUNTER — Other Ambulatory Visit: Payer: Self-pay | Admitting: Nurse Practitioner

## 2023-09-13 DIAGNOSIS — N2581 Secondary hyperparathyroidism of renal origin: Secondary | ICD-10-CM | POA: Diagnosis not present

## 2023-09-13 DIAGNOSIS — Z992 Dependence on renal dialysis: Secondary | ICD-10-CM | POA: Diagnosis not present

## 2023-09-13 DIAGNOSIS — L299 Pruritus, unspecified: Secondary | ICD-10-CM | POA: Diagnosis not present

## 2023-09-13 DIAGNOSIS — I120 Hypertensive chronic kidney disease with stage 5 chronic kidney disease or end stage renal disease: Secondary | ICD-10-CM | POA: Diagnosis not present

## 2023-09-13 DIAGNOSIS — N186 End stage renal disease: Secondary | ICD-10-CM | POA: Diagnosis not present

## 2023-09-13 DIAGNOSIS — R52 Pain, unspecified: Secondary | ICD-10-CM | POA: Diagnosis not present

## 2023-09-13 DIAGNOSIS — E1129 Type 2 diabetes mellitus with other diabetic kidney complication: Secondary | ICD-10-CM | POA: Diagnosis not present

## 2023-09-13 DIAGNOSIS — D689 Coagulation defect, unspecified: Secondary | ICD-10-CM | POA: Diagnosis not present

## 2023-09-13 DIAGNOSIS — A499 Bacterial infection, unspecified: Secondary | ICD-10-CM | POA: Diagnosis not present

## 2023-09-13 MED ORDER — TRAZODONE HCL 100 MG PO TABS: 100.0000 mg | ORAL_TABLET | Freq: Every day | ORAL | 4 refills | Status: DC

## 2023-09-13 NOTE — Telephone Encounter (Unsigned)
 Copied from CRM [id number]. Topic: Clinical - Medication Refill >> Sep 13, 2023  2:14 PM Kevelyn M wrote: Medication: ELIQUIS  2.5 MG TABS tablet, amiodarone  (PACERONE ) 200 MG tablet, carvedilol  (COREG ) 6.25 MG tablet, rosuvastatin  (CRESTOR ) 40 MG tablet, HYDROcodone -acetaminophen  (NORCO) 10-325 MG tablet   Has the patient contacted their pharmacy? Yes (Agent: If no, request that the patient contact the pharmacy for the refill. If patient does not wish to contact the pharmacy document the reason why and proceed with request.) (Agent: If yes, when and what did the pharmacy advise?)  This is the patient's preferred pharmacy:   [facility] [address] ([address]), [place] - [address] [place]) [place] [address] Phone: [phone] Fax: [phone]  Is this the correct pharmacy for this prescription? Yes If no, delete pharmacy and type the correct one.   Has the prescription been filled recently? No  Is the patient out of the medication? Yes  Has the patient been seen for an appointment in the last year OR does the patient have an upcoming appointment? Yes  Can we respond through MyChart? No  Agent: Please be advised that Rx refills may take up to 3 business days. We ask that you follow-up with your pharmacy.

## 2023-09-14 DIAGNOSIS — I87312 Chronic venous hypertension (idiopathic) with ulcer of left lower extremity: Secondary | ICD-10-CM | POA: Diagnosis not present

## 2023-09-14 DIAGNOSIS — Z992 Dependence on renal dialysis: Secondary | ICD-10-CM | POA: Diagnosis not present

## 2023-09-14 DIAGNOSIS — Z993 Dependence on wheelchair: Secondary | ICD-10-CM | POA: Diagnosis not present

## 2023-09-14 DIAGNOSIS — L97522 Non-pressure chronic ulcer of other part of left foot with fat layer exposed: Secondary | ICD-10-CM | POA: Diagnosis not present

## 2023-09-14 DIAGNOSIS — D631 Anemia in chronic kidney disease: Secondary | ICD-10-CM | POA: Diagnosis not present

## 2023-09-14 DIAGNOSIS — E11621 Type 2 diabetes mellitus with foot ulcer: Secondary | ICD-10-CM | POA: Diagnosis not present

## 2023-09-14 DIAGNOSIS — Z556 Problems related to health literacy: Secondary | ICD-10-CM | POA: Diagnosis not present

## 2023-09-14 DIAGNOSIS — I509 Heart failure, unspecified: Secondary | ICD-10-CM | POA: Diagnosis not present

## 2023-09-14 DIAGNOSIS — Z7901 Long term (current) use of anticoagulants: Secondary | ICD-10-CM | POA: Diagnosis not present

## 2023-09-14 DIAGNOSIS — L97821 Non-pressure chronic ulcer of other part of left lower leg limited to breakdown of skin: Secondary | ICD-10-CM | POA: Diagnosis not present

## 2023-09-14 DIAGNOSIS — Z89611 Acquired absence of right leg above knee: Secondary | ICD-10-CM | POA: Diagnosis not present

## 2023-09-14 DIAGNOSIS — E785 Hyperlipidemia, unspecified: Secondary | ICD-10-CM | POA: Diagnosis not present

## 2023-09-14 DIAGNOSIS — I132 Hypertensive heart and chronic kidney disease with heart failure and with stage 5 chronic kidney disease, or end stage renal disease: Secondary | ICD-10-CM | POA: Diagnosis not present

## 2023-09-14 DIAGNOSIS — N186 End stage renal disease: Secondary | ICD-10-CM | POA: Diagnosis not present

## 2023-09-14 DIAGNOSIS — E1122 Type 2 diabetes mellitus with diabetic chronic kidney disease: Secondary | ICD-10-CM | POA: Diagnosis not present

## 2023-09-15 DIAGNOSIS — D509 Iron deficiency anemia, unspecified: Secondary | ICD-10-CM | POA: Diagnosis not present

## 2023-09-15 DIAGNOSIS — N186 End stage renal disease: Secondary | ICD-10-CM | POA: Diagnosis not present

## 2023-09-15 DIAGNOSIS — R52 Pain, unspecified: Secondary | ICD-10-CM | POA: Diagnosis not present

## 2023-09-15 DIAGNOSIS — N2581 Secondary hyperparathyroidism of renal origin: Secondary | ICD-10-CM | POA: Diagnosis not present

## 2023-09-15 DIAGNOSIS — Z992 Dependence on renal dialysis: Secondary | ICD-10-CM | POA: Diagnosis not present

## 2023-09-15 DIAGNOSIS — E8779 Other fluid overload: Secondary | ICD-10-CM | POA: Diagnosis not present

## 2023-09-15 DIAGNOSIS — D689 Coagulation defect, unspecified: Secondary | ICD-10-CM | POA: Diagnosis not present

## 2023-09-15 MED ORDER — CARVEDILOL 6.25 MG PO TABS: 6.2500 mg | ORAL_TABLET | Freq: Every day | ORAL | 2 refills | Status: DC

## 2023-09-15 MED ORDER — ROSUVASTATIN CALCIUM 40 MG PO TABS: 40.0000 mg | ORAL_TABLET | Freq: Every day | ORAL | 0 refills | Status: DC

## 2023-09-15 MED ORDER — ELIQUIS 2.5 MG PO TABS: 2.5000 mg | ORAL_TABLET | Freq: Two times a day (BID) | ORAL | 0 refills | Status: DC

## 2023-09-15 MED ORDER — AMIODARONE HCL 200 MG PO TABS: 200.0000 mg | ORAL_TABLET | Freq: Two times a day (BID) | ORAL | 4 refills | Status: DC

## 2023-09-15 NOTE — Telephone Encounter (Signed)
 Requested medication (s) are due for refill today - unsure  Requested medication (s) are on the active medication list -yes  Future visit scheduled -yes-tomorrow  Last refill: amiodarone - 10/12/22 #180 4RF- non delegated Rx                  Carvedilol -06/25/23- historical provider                  Notes to clinic: see above  Requested Prescriptions  Pending Prescriptions Disp Refills   amiodarone  (PACERONE ) 200 MG tablet 180 tablet 4    Sig: Take 1 tablet (200 mg total) by mouth 2 (two) times daily.     Not Delegated - Cardiovascular: Antiarrhythmic Agents - amiodarone  Failed - 09/15/2023 12:08 PM      Failed - This refill cannot be delegated      Failed - Manual Review: Eye exam recommended every 12 months      Failed - TSH in normal range and within 360 days    TSH  Date Value Ref Range Status  05/12/2022 4.090 0.450 - 4.500 uIU/mL Final         Failed - Valid encounter within last 6 months    Recent Outpatient Visits   None            Passed - Mg Level in normal range and within 360 days    Magnesium   Date Value Ref Range Status  08/09/2023 2.4 1.7 - 2.4 mg/dL Final    Comment:    Performed at [facility], [address] [place], [place] [address]         Passed - K in normal range and within 180 days    Potassium  Date Value Ref Range Status  09/07/2023 4.1 3.5 - 5.1 mmol/L Final   Potassium [facility] vascular lab)  Date Value Ref Range Status  08/27/2022 5.2 (H) 3.5 - 5.1 mmol/L Final    Comment:    Performed at [facility], [address], [place], [place] [address]         Passed - AST in normal range and within 180 days    AST  Date Value Ref Range Status  08/29/2023 22 15 - 41 U/L Final         Passed - ALT in normal range and within 180 days    ALT  Date Value Ref Range Status  08/29/2023 13 0 - 44 U/L Final         Passed - Patient had ECG in the last 180 days      Passed - Patient is not pregnant      Passed - Last BP  in normal range    BP Readings from Last 1 Encounters:  09/07/23 105/61         Passed - Last Heart Rate in normal range    Pulse Readings from Last 1 Encounters:  09/07/23 (!) 57         Passed - Patient had chest x-ray within the last 6 months       carvedilol  (COREG ) 6.25 MG tablet      Sig: Take 1 tablet (6.25 mg total) by mouth daily.     Cardiovascular: Beta Blockers 3 Failed - 09/15/2023 12:08 PM      Failed - Cr in normal range and within 360 days    Creatinine, Ser  Date Value Ref Range Status  09/07/2023 7.28 (H) 0.61 - 1.24 mg/dL Final  Failed - Valid encounter within last 6 months    Recent Outpatient Visits   None            Passed - AST in normal range and within 360 days    AST  Date Value Ref Range Status  08/29/2023 22 15 - 41 U/L Final         Passed - ALT in normal range and within 360 days    ALT  Date Value Ref Range Status  08/29/2023 13 0 - 44 U/L Final         Passed - Last BP in normal range    BP Readings from Last 1 Encounters:  09/07/23 105/61         Passed - Last Heart Rate in normal range    Pulse Readings from Last 1 Encounters:  09/07/23 (!) 57         Signed Prescriptions Disp Refills   ELIQUIS  2.5 MG TABS tablet 180 tablet 0    Sig: Take 1 tablet (2.5 mg total) by mouth 2 (two) times daily.     Hematology:  Anticoagulants - apixaban  Failed - 09/15/2023 12:08 PM      Failed - HGB in normal range and within 360 days    Hemoglobin  Date Value Ref Range Status  09/07/2023 8.3 (L) 13.0 - 17.0 g/dL Final  89/70/7975 83.1 13.0 - 17.7 g/dL Final         Failed - HCT in normal range and within 360 days    HCT  Date Value Ref Range Status  09/07/2023 26.7 (L) 39.0 - 52.0 % Final   Hematocrit  Date Value Ref Range Status  01/12/2023 51.0 37.5 - 51.0 % Final         Failed - Cr in normal range and within 360 days    Creatinine, Ser  Date Value Ref Range Status  09/07/2023 7.28 (H) 0.61 - 1.24 mg/dL Final          Failed - Valid encounter within last 12 months    Recent Outpatient Visits   None            Passed - PLT in normal range and within 360 days    Platelets  Date Value Ref Range Status  09/07/2023 242 150 - 400 K/uL Final  01/12/2023 257 150 - 450 x10E3/uL Final         Passed - AST in normal range and within 360 days    AST  Date Value Ref Range Status  08/29/2023 22 15 - 41 U/L Final         Passed - ALT in normal range and within 360 days    ALT  Date Value Ref Range Status  08/29/2023 13 0 - 44 U/L Final          rosuvastatin  (CRESTOR ) 40 MG tablet 90 tablet 0    Sig: Take 1 tablet (40 mg total) by mouth daily.     Cardiovascular:  Antilipid - Statins 2 Failed - 09/15/2023 12:08 PM      Failed - Cr in normal range and within 360 days    Creatinine, Ser  Date Value Ref Range Status  09/07/2023 7.28 (H) 0.61 - 1.24 mg/dL Final         Failed - Valid encounter within last 12 months    Recent Outpatient Visits   None            Failed - Lipid Panel in  normal range within the last 12 months    Cholesterol, Total  Date Value Ref Range Status  01/12/2023 179 100 - 199 mg/dL Final   LDL Chol Calc (NIH)  Date Value Ref Range Status  01/12/2023 117 (H) 0 - 99 mg/dL Final   HDL  Date Value Ref Range Status  01/12/2023 43 >39 mg/dL Final   Triglycerides  Date Value Ref Range Status  08/06/2023 77 <150 mg/dL Final    Comment:    Performed at [facility], [address] [place], [place] [address]         Passed - Patient is not pregnant         Requested Prescriptions  Pending Prescriptions Disp Refills   amiodarone  (PACERONE ) 200 MG tablet 180 tablet 4    Sig: Take 1 tablet (200 mg total) by mouth 2 (two) times daily.     Not Delegated - Cardiovascular: Antiarrhythmic Agents - amiodarone  Failed - 09/15/2023 12:08 PM      Failed - This refill cannot be delegated      Failed - Manual Review: Eye exam recommended every 12 months       Failed - TSH in normal range and within 360 days    TSH  Date Value Ref Range Status  05/12/2022 4.090 0.450 - 4.500 uIU/mL Final         Failed - Valid encounter within last 6 months    Recent Outpatient Visits   None            Passed - Mg Level in normal range and within 360 days    Magnesium   Date Value Ref Range Status  08/09/2023 2.4 1.7 - 2.4 mg/dL Final    Comment:    Performed at [facility], [address] [place], [place] [address]         Passed - K in normal range and within 180 days    Potassium  Date Value Ref Range Status  09/07/2023 4.1 3.5 - 5.1 mmol/L Final   Potassium [facility] vascular lab)  Date Value Ref Range Status  08/27/2022 5.2 (H) 3.5 - 5.1 mmol/L Final    Comment:    Performed at [facility], [address], [place], [place] [address]         Passed - AST in normal range and within 180 days    AST  Date Value Ref Range Status  08/29/2023 22 15 - 41 U/L Final         Passed - ALT in normal range and within 180 days    ALT  Date Value Ref Range Status  08/29/2023 13 0 - 44 U/L Final         Passed - Patient had ECG in the last 180 days      Passed - Patient is not pregnant      Passed - Last BP in normal range    BP Readings from Last 1 Encounters:  09/07/23 105/61         Passed - Last Heart Rate in normal range    Pulse Readings from Last 1 Encounters:  09/07/23 (!) 57         Passed - Patient had chest x-ray within the last 6 months       carvedilol  (COREG ) 6.25 MG tablet      Sig: Take 1 tablet (6.25 mg total) by mouth daily.     Cardiovascular: Beta Blockers 3 Failed - 09/15/2023 12:08  PM      Failed - Cr in normal range and within 360 days    Creatinine, Ser  Date Value Ref Range Status  09/07/2023 7.28 (H) 0.61 - 1.24 mg/dL Final         Failed - Valid encounter within last 6 months    Recent Outpatient Visits   None            Passed - AST in normal range and within 360 days    AST   Date Value Ref Range Status  08/29/2023 22 15 - 41 U/L Final         Passed - ALT in normal range and within 360 days    ALT  Date Value Ref Range Status  08/29/2023 13 0 - 44 U/L Final         Passed - Last BP in normal range    BP Readings from Last 1 Encounters:  09/07/23 105/61         Passed - Last Heart Rate in normal range    Pulse Readings from Last 1 Encounters:  09/07/23 (!) 57         Signed Prescriptions Disp Refills   ELIQUIS  2.5 MG TABS tablet 180 tablet 0    Sig: Take 1 tablet (2.5 mg total) by mouth 2 (two) times daily.     Hematology:  Anticoagulants - apixaban  Failed - 09/15/2023 12:08 PM      Failed - HGB in normal range and within 360 days    Hemoglobin  Date Value Ref Range Status  09/07/2023 8.3 (L) 13.0 - 17.0 g/dL Final  89/70/7975 83.1 13.0 - 17.7 g/dL Final         Failed - HCT in normal range and within 360 days    HCT  Date Value Ref Range Status  09/07/2023 26.7 (L) 39.0 - 52.0 % Final   Hematocrit  Date Value Ref Range Status  01/12/2023 51.0 37.5 - 51.0 % Final         Failed - Cr in normal range and within 360 days    Creatinine, Ser  Date Value Ref Range Status  09/07/2023 7.28 (H) 0.61 - 1.24 mg/dL Final         Failed - Valid encounter within last 12 months    Recent Outpatient Visits   None            Passed - PLT in normal range and within 360 days    Platelets  Date Value Ref Range Status  09/07/2023 242 150 - 400 K/uL Final  01/12/2023 257 150 - 450 x10E3/uL Final         Passed - AST in normal range and within 360 days    AST  Date Value Ref Range Status  08/29/2023 22 15 - 41 U/L Final         Passed - ALT in normal range and within 360 days    ALT  Date Value Ref Range Status  08/29/2023 13 0 - 44 U/L Final          rosuvastatin  (CRESTOR ) 40 MG tablet 90 tablet 0    Sig: Take 1 tablet (40 mg total) by mouth daily.     Cardiovascular:  Antilipid - Statins 2 Failed - 09/15/2023 12:08 PM       Failed - Cr in normal range and within 360 days    Creatinine, Ser  Date Value Ref Range Status  09/07/2023 7.28 (H) 0.61 -  1.24 mg/dL Final         Failed - Valid encounter within last 12 months    Recent Outpatient Visits   None            Failed - Lipid Panel in normal range within the last 12 months    Cholesterol, Total  Date Value Ref Range Status  01/12/2023 179 100 - 199 mg/dL Final   LDL Chol Calc (NIH)  Date Value Ref Range Status  01/12/2023 117 (H) 0 - 99 mg/dL Final   HDL  Date Value Ref Range Status  01/12/2023 43 >39 mg/dL Final   Triglycerides  Date Value Ref Range Status  08/06/2023 77 <150 mg/dL Final    Comment:    Performed at [facility], [address] [place], [place] [address]         Passed - Patient is not pregnant

## 2023-09-15 NOTE — Telephone Encounter (Signed)
 Appt scheduled 09/16/23- will give courtesy refill due to original Rx written for 15 months Requested Prescriptions  Pending Prescriptions Disp Refills   ELIQUIS  2.5 MG TABS tablet 180 tablet 0    Sig: Take 1 tablet (2.5 mg total) by mouth 2 (two) times daily.     Hematology:  Anticoagulants - apixaban  Failed - 09/15/2023 12:08 PM      Failed - HGB in normal range and within 360 days    Hemoglobin  Date Value Ref Range Status  09/07/2023 8.3 (L) 13.0 - 17.0 g/dL Final  89/70/7975 83.1 13.0 - 17.7 g/dL Final         Failed - HCT in normal range and within 360 days    HCT  Date Value Ref Range Status  09/07/2023 26.7 (L) 39.0 - 52.0 % Final   Hematocrit  Date Value Ref Range Status  01/12/2023 51.0 37.5 - 51.0 % Final         Failed - Cr in normal range and within 360 days    Creatinine, Ser  Date Value Ref Range Status  09/07/2023 7.28 (H) 0.61 - 1.24 mg/dL Final         Failed - Valid encounter within last 12 months    Recent Outpatient Visits   None            Passed - PLT in normal range and within 360 days    Platelets  Date Value Ref Range Status  09/07/2023 242 150 - 400 K/uL Final  01/12/2023 257 150 - 450 x10E3/uL Final         Passed - AST in normal range and within 360 days    AST  Date Value Ref Range Status  08/29/2023 22 15 - 41 U/L Final         Passed - ALT in normal range and within 360 days    ALT  Date Value Ref Range Status  08/29/2023 13 0 - 44 U/L Final          amiodarone  (PACERONE ) 200 MG tablet 180 tablet 4    Sig: Take 1 tablet (200 mg total) by mouth 2 (two) times daily.     Not Delegated - Cardiovascular: Antiarrhythmic Agents - amiodarone  Failed - 09/15/2023 12:08 PM      Failed - This refill cannot be delegated      Failed - Manual Review: Eye exam recommended every 12 months      Failed - TSH in normal range and within 360 days    TSH  Date Value Ref Range Status  05/12/2022 4.090 0.450 - 4.500 uIU/mL Final          Failed - Valid encounter within last 6 months    Recent Outpatient Visits   None            Passed - Mg Level in normal range and within 360 days    Magnesium   Date Value Ref Range Status  08/09/2023 2.4 1.7 - 2.4 mg/dL Final    Comment:    Performed at [facility], [address] [place], [place] [address]         Passed - K in normal range and within 180 days    Potassium  Date Value Ref Range Status  09/07/2023 4.1 3.5 - 5.1 mmol/L Final   Potassium [facility] vascular lab)  Date Value Ref Range Status  08/27/2022 5.2 (H) 3.5 - 5.1 mmol/L Final    Comment:    Performed  at [facility], [address] [place], [place] [address]         Passed - AST in normal range and within 180 days    AST  Date Value Ref Range Status  08/29/2023 22 15 - 41 U/L Final         Passed - ALT in normal range and within 180 days    ALT  Date Value Ref Range Status  08/29/2023 13 0 - 44 U/L Final         Passed - Patient had ECG in the last 180 days      Passed - Patient is not pregnant      Passed - Last BP in normal range    BP Readings from Last 1 Encounters:  09/07/23 105/61         Passed - Last Heart Rate in normal range    Pulse Readings from Last 1 Encounters:  09/07/23 (!) 57         Passed - Patient had chest x-ray within the last 6 months       carvedilol  (COREG ) 6.25 MG tablet      Sig: Take 1 tablet (6.25 mg total) by mouth daily.     Cardiovascular: Beta Blockers 3 Failed - 09/15/2023 12:08 PM      Failed - Cr in normal range and within 360 days    Creatinine, Ser  Date Value Ref Range Status  09/07/2023 7.28 (H) 0.61 - 1.24 mg/dL Final         Failed - Valid encounter within last 6 months    Recent Outpatient Visits   None            Passed - AST in normal range and within 360 days    AST  Date Value Ref Range Status  08/29/2023 22 15 - 41 U/L Final         Passed - ALT in normal range and within 360 days    ALT  Date Value  Ref Range Status  08/29/2023 13 0 - 44 U/L Final         Passed - Last BP in normal range    BP Readings from Last 1 Encounters:  09/07/23 105/61         Passed - Last Heart Rate in normal range    Pulse Readings from Last 1 Encounters:  09/07/23 (!) 57          rosuvastatin  (CRESTOR ) 40 MG tablet 90 tablet 0    Sig: Take 1 tablet (40 mg total) by mouth daily.     Cardiovascular:  Antilipid - Statins 2 Failed - 09/15/2023 12:08 PM      Failed - Cr in normal range and within 360 days    Creatinine, Ser  Date Value Ref Range Status  09/07/2023 7.28 (H) 0.61 - 1.24 mg/dL Final         Failed - Valid encounter within last 12 months    Recent Outpatient Visits   None            Failed - Lipid Panel in normal range within the last 12 months    Cholesterol, Total  Date Value Ref Range Status  01/12/2023 179 100 - 199 mg/dL Final   LDL Chol Calc (NIH)  Date Value Ref Range Status  01/12/2023 117 (H) 0 - 99 mg/dL Final   HDL  Date Value Ref Range Status  01/12/2023 43 >39 mg/dL Final  Triglycerides  Date Value Ref Range Status  08/06/2023 77 <150 mg/dL Final    Comment:    Performed at [facility], [address], [place], [place] [address]         Passed - Patient is not pregnant

## 2023-09-16 ENCOUNTER — Ambulatory Visit (INDEPENDENT_AMBULATORY_CARE_PROVIDER_SITE_OTHER): Admitting: Nurse Practitioner

## 2023-09-16 ENCOUNTER — Encounter: Payer: Self-pay | Admitting: Nurse Practitioner

## 2023-09-16 ENCOUNTER — Telehealth: Payer: Self-pay | Admitting: Nurse Practitioner

## 2023-09-16 VITALS — BP 120/72 | HR 69 | Temp 98.8°F | Resp 15 | Ht 75.0 in | Wt 228.0 lb

## 2023-09-16 DIAGNOSIS — L97522 Non-pressure chronic ulcer of other part of left foot with fat layer exposed: Secondary | ICD-10-CM | POA: Diagnosis not present

## 2023-09-16 DIAGNOSIS — G894 Chronic pain syndrome: Secondary | ICD-10-CM

## 2023-09-16 DIAGNOSIS — I83028 Varicose veins of left lower extremity with ulcer other part of lower leg: Secondary | ICD-10-CM | POA: Diagnosis not present

## 2023-09-16 DIAGNOSIS — E11621 Type 2 diabetes mellitus with foot ulcer: Secondary | ICD-10-CM | POA: Diagnosis not present

## 2023-09-16 DIAGNOSIS — F5101 Primary insomnia: Secondary | ICD-10-CM

## 2023-09-16 DIAGNOSIS — I87312 Chronic venous hypertension (idiopathic) with ulcer of left lower extremity: Secondary | ICD-10-CM | POA: Diagnosis not present

## 2023-09-16 DIAGNOSIS — Z7901 Long term (current) use of anticoagulants: Secondary | ICD-10-CM | POA: Diagnosis not present

## 2023-09-16 DIAGNOSIS — J9601 Acute respiratory failure with hypoxia: Secondary | ICD-10-CM | POA: Diagnosis not present

## 2023-09-16 DIAGNOSIS — Z1211 Encounter for screening for malignant neoplasm of colon: Secondary | ICD-10-CM | POA: Diagnosis not present

## 2023-09-16 DIAGNOSIS — Z556 Problems related to health literacy: Secondary | ICD-10-CM | POA: Diagnosis not present

## 2023-09-16 DIAGNOSIS — M79641 Pain in right hand: Secondary | ICD-10-CM | POA: Diagnosis not present

## 2023-09-16 DIAGNOSIS — Z89611 Acquired absence of right leg above knee: Secondary | ICD-10-CM | POA: Diagnosis not present

## 2023-09-16 DIAGNOSIS — E785 Hyperlipidemia, unspecified: Secondary | ICD-10-CM | POA: Diagnosis not present

## 2023-09-16 DIAGNOSIS — Z993 Dependence on wheelchair: Secondary | ICD-10-CM | POA: Diagnosis not present

## 2023-09-16 DIAGNOSIS — I509 Heart failure, unspecified: Secondary | ICD-10-CM | POA: Diagnosis not present

## 2023-09-16 DIAGNOSIS — E1122 Type 2 diabetes mellitus with diabetic chronic kidney disease: Secondary | ICD-10-CM

## 2023-09-16 DIAGNOSIS — N186 End stage renal disease: Secondary | ICD-10-CM | POA: Diagnosis not present

## 2023-09-16 DIAGNOSIS — E1169 Type 2 diabetes mellitus with other specified complication: Secondary | ICD-10-CM | POA: Diagnosis not present

## 2023-09-16 DIAGNOSIS — D631 Anemia in chronic kidney disease: Secondary | ICD-10-CM | POA: Diagnosis not present

## 2023-09-16 DIAGNOSIS — L97821 Non-pressure chronic ulcer of other part of left lower leg limited to breakdown of skin: Secondary | ICD-10-CM | POA: Diagnosis not present

## 2023-09-16 DIAGNOSIS — I132 Hypertensive heart and chronic kidney disease with heart failure and with stage 5 chronic kidney disease, or end stage renal disease: Secondary | ICD-10-CM | POA: Diagnosis not present

## 2023-09-16 DIAGNOSIS — J690 Pneumonitis due to inhalation of food and vomit: Secondary | ICD-10-CM | POA: Diagnosis not present

## 2023-09-16 DIAGNOSIS — R7881 Bacteremia: Secondary | ICD-10-CM | POA: Diagnosis not present

## 2023-09-16 DIAGNOSIS — Z992 Dependence on renal dialysis: Secondary | ICD-10-CM | POA: Diagnosis not present

## 2023-09-16 MED ORDER — OXYCODONE-ACETAMINOPHEN 5-325 MG PO TABS: 1.0000 | ORAL_TABLET | Freq: Every day | ORAL | 0 refills | Status: DC | PRN

## 2023-09-16 MED ORDER — ZOLPIDEM TARTRATE 10 MG PO TABS: 10.0000 mg | ORAL_TABLET | Freq: Every evening | ORAL | 1 refills | Status: DC | PRN

## 2023-09-16 NOTE — Assessment & Plan Note (Signed)
 Acute and improved at this time.  Lung sounds overall clear.  May benefit speech therapy in future. Attempted to obtain labs today, but lab unable to obtain due to poor access.  Will plan on labs next visit or sooner if any worsening symptoms return.

## 2023-09-16 NOTE — Assessment & Plan Note (Signed)
 Is to follow-up with ID per discharge notes, will place referral.

## 2023-09-16 NOTE — Assessment & Plan Note (Signed)
 Ongoing and patient reports improving, becoming smaller.  Continue home health nurse for wound care and collaboration with vascular.  Get back into wound clinic as needed.

## 2023-09-16 NOTE — Assessment & Plan Note (Signed)
 This is dominant hand.  Will get PT and OT into home, OT to work on hand pain and PT to assist with balance and walker use.  Recommend Tylenol  as needed and ensure gentle stretching daily.  Suspect OA.

## 2023-09-16 NOTE — Progress Notes (Signed)
 BP 120/72 (BP Location: Left Arm, Patient Position: Sitting, Cuff Size: Large)   Pulse 69   Temp 98.8 F (37.1 C) (Oral)   Resp 15   Ht 6' 3 (1.905 m)   Wt 228 lb (103.4 kg)   SpO2 99%   BMI 28.50 kg/m    Subjective:    [name], male    DOB: 27-Feb-1961, 63 y.o.   MRN: [id number]  HPI: Adam Howell is a 63 y.o. male  Chief Complaint  Patient presents with   Hospitalization Follow-up    Pneumonia feels better but has a remaining cough. Taste is also back to normal now. Cath was also placed in his chest as permanent.   Will check during dialysis tomorrow for suture removal left forearm.    Insomnia    Says the trazadone he was given in the hospital does not help him with sleeping.    Transition of Care Hospital Follow up.  Was treated for pneumonia at [facility], aspiration pneumonia is noted on review.  Had a really bad cough at the time and could not taste anything.  Was negative for Covid.  Started to feel bad on Father's Day.  Could not breath, could not sleep, and felt overall bad at the time.  Cough is still present, but now more dry cough.  It is better than it was, was barking before.  No fevers and no more SOB.  Has been having right hand pain, chronic in nature. This is his dominant hand.  Cannot bend it all the way.  Left hand causes no issues.  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  Was given Trazodone  in hospital and this did not help him sleep.  He would like to start back on Ambien  which he took in past.  Does not use CPAP.  Had sleep study in past, which reported he had trouble sleeping he states.  This was years ago.  Last A1c in May was 6%.  Needs refill on pain medication which he only uses for dialysis, last fill 02/02/23.  Living with his daughter and she makes sure he takes all of his medication and feeds him. Reports he needs a walker, does not have one and they are coming out to teach him how to walk.    Has a nurse  come out to home now to check on wound, went to wound clinic one time for his left lower leg wound that presented months back.  Now nursing comes in home and cares for this.  He reports it is getting smaller in size.  PT is to start coming into home, has not heard from them.    Brief Hospital Course Summary: Jori Thrall is a 63 y.o. male with medical history significant of ESRD-HD (MWF), HTN, HLD, DM, CAD, dCHF, A fib on Eliquis , amenia, chronic pain, s/p of right AKA, who presents with SOB. Of note, had complicated admission from 5/22 - 5/29 including intubation and pressor use in ICU.   Had 2L O2 requirement on presentation. Treated for aspiration pneumonia. Now weaned to room air.  Also found to have MRSA bacteremia with source suspected to be recent temp cath or AVF site. TEE neg vegetation. Both sources removed. Follow up blood cultures NGTD. Undergoing minimum of 6 weeks vancomycin , flagyl  with HD.  Ongoing management of vascular access modifications per vascular surgery- permcath placed 6/23 prior to dc and operable in HD session prior to dc.  ID following for Abx guidance. Nephrology managing HD. PT/OT recommended SNF but patient declined and will go home with his daughter instead with HH.   All other chronic conditions were treated with home medications.     Discharge Condition: Good, improved Recommended discharge diet: Regular healthy diet  Hospital/Facility: [facility] D/C Physician: Dr. Lenon D/C Date: 09/07/23  Records Requested: 09/16/23 Records Received: 09/16/23 Records Reviewed: 09/16/23  Diagnoses on Discharge: Pneumonia of both lungs due to infectious organism   Date of interactive Contact within 48 hours of discharge:  Contact was through: none present  Date of 7 day or 14 day face-to-face visit:    within 14 days  Outpatient Encounter Medications as of 09/16/2023  Medication Sig Note   acetaminophen  (TYLENOL ) 325 MG tablet Take 2 tablets (650 mg total) by mouth every  6 (six) hours as needed for mild pain (pain score 1-3), fever or headache (or Fever >/= 101).    amiodarone  (PACERONE ) 200 MG tablet Take 1 tablet (200 mg total) by mouth 2 (two) times daily.    aspirin  EC 81 MG tablet Take 1 tablet (81 mg total) by mouth daily. Swallow whole.    carvedilol  (COREG ) 6.25 MG tablet Take 1 tablet (6.25 mg total) by mouth daily.    ELIQUIS  2.5 MG TABS tablet Take 1 tablet (2.5 mg total) by mouth 2 (two) times daily.    iron  polysaccharides (NIFEREX) 150 MG capsule Take 1 capsule (150 mg total) by mouth daily.    midodrine  (PROAMATINE ) 5 MG tablet Take 5 mg by mouth as needed (post dialysis hypotension SBP<90 or DBP<55).    ondansetron  (ZOFRAN ) 4 MG tablet Take 4 mg by mouth every 6 (six) hours as needed for nausea or vomiting. 08/07/2023: prn   rosuvastatin  (CRESTOR ) 40 MG tablet Take 1 tablet (40 mg total) by mouth daily.    sevelamer  carbonate (RENVELA ) 800 MG tablet Take 2,400 mg by mouth 3 (three) times daily.    vancomycin  IVPB Inject 1,000 mg into the vein every Monday, Wednesday, and Friday with hemodialysis for 21 days. Patient to receive vancomycin  1gm IV with outpatient HD on MWF Indication:  MRSA bacteremia Last Day of Therapy:  09/29/2023 Labs - weekly  CBC/D, CMP, and pre-HD vancomycin  level    zolpidem  (AMBIEN ) 10 MG tablet Take 1 tablet (10 mg total) by mouth at bedtime as needed for sleep.    [DISCONTINUED] dextromethorphan -guaiFENesin  (MUCINEX  DM) 30-600 MG 12hr tablet Take 1 tablet by mouth 2 (two) times daily as needed for up to 15 days for cough.    oxyCODONE -acetaminophen  (PERCOCET/ROXICET) 5-325 MG tablet Take 1-2 tablets by mouth daily as needed for moderate pain (pain score 4-6). To use prior to dialysis only.    [DISCONTINUED] amiodarone  (PACERONE ) 200 MG tablet Take 1 tablet (200 mg total) by mouth 2 (two) times daily.    [DISCONTINUED] carvedilol  (COREG ) 6.25 MG tablet Take 6.25 mg by mouth daily.    [DISCONTINUED] ELIQUIS  2.5 MG TABS tablet  Take 1 tablet (2.5 mg total) by mouth 2 (two) times daily.    [DISCONTINUED] metroNIDAZOLE  (FLAGYL ) 500 MG tablet Take 1 tablet (500 mg total) by mouth every 12 (twelve) hours for 11 doses.    [DISCONTINUED] oxyCODONE -acetaminophen  (PERCOCET/ROXICET) 5-325 MG tablet Take 1-2 tablets by mouth every 4 (four) hours as needed for moderate pain (pain score 4-6). (Patient not taking: Reported on 09/16/2023)    [DISCONTINUED] rosuvastatin  (CRESTOR ) 40 MG tablet Take 1 tablet (40 mg total) by mouth daily.    [  DISCONTINUED] traZODone  (DESYREL ) 100 MG tablet Take 1 tablet (100 mg total) by mouth at bedtime.    [DISCONTINUED] traZODone  (DESYREL ) 100 MG tablet Take 1 tablet (100 mg total) by mouth at bedtime. (Patient not taking: Reported on 09/16/2023)    No facility-administered encounter medications on file as of 09/16/2023.    Diagnostic Tests Reviewed/Disposition:     Latest Ref Rng & Units 09/07/2023    4:03 AM 09/06/2023    3:23 AM 09/04/2023    4:37 AM  CBC  WBC 4.0 - 10.5 K/uL 5.6  5.0  4.1   Hemoglobin 13.0 - 17.0 g/dL 8.3  8.6  8.3   Hematocrit 39.0 - 52.0 % 26.7  29.0  27.5   Platelets 150 - 400 K/uL 242  225  211     CMP     Component Value Date/Time   NA 134 (L) 09/07/2023 0403   NA 136 04/14/2023 1610   K 4.1 09/07/2023 0403   CL 97 (L) 09/07/2023 0403   CO2 28 09/07/2023 0403   GLUCOSE 105 (H) 09/07/2023 0403   BUN 28 (H) 09/07/2023 0403   BUN 18 04/14/2023 1610   CREATININE 7.28 (H) 09/07/2023 0403   CALCIUM  8.0 (L) 09/07/2023 0403   PROT 6.7 08/29/2023 1958   PROT 7.8 08/11/2022 1336   ALBUMIN  2.4 (L) 09/07/2023 0403   ALBUMIN  4.3 04/14/2023 1610   AST 22 08/29/2023 1958   ALT 13 08/29/2023 1958   ALKPHOS 69 08/29/2023 1958   BILITOT 0.9 08/29/2023 1958   BILITOT 0.8 08/11/2022 1336   EGFR 11 (L) 04/14/2023 1610   GFRNONAA 8 (L) 09/07/2023 0403    Consults: ID Vascular surgery  Nephrology  Cardiology   Discharge Instructions: Follow up with PCP within 1-2  weeks Regarding general hospital follow up and preventative care Recommend BMP, CBC Follow up with ID (not scheduled, will place referral) Follow up with cardiology (not scheduled, will get scheduled) Follow up with nephrology (sees at dialysis)   Disease/illness Education: Reviewed at length with patient today  Home Health/Community Services Discussions/Referrals: Will place referral for OT and PT  Establishment or re-establishment of referral orders for community resources: Call over and get visit scheduled with cardiology + place ID referral  Discussion with other health care providers: reviewed all recent notes  Assessment and Support of treatment regimen adherence: Reviewed at length with patient today  Appointments Coordinated with: Reviewed at length with patient today  Education for self-management, independent living, and ADLs: Reviewed at length with patient today   Relevant past medical, surgical, family and social history reviewed and updated as indicated. Interim medical history since our last visit reviewed. Allergies and medications reviewed and updated.  Review of Systems  Constitutional:  Positive for fatigue (little bit). Negative for activity change, appetite change (improved), diaphoresis and fever.  Respiratory:  Negative for cough, chest tightness, shortness of breath and wheezing.   Cardiovascular:  Negative for chest pain, palpitations and leg swelling.  Gastrointestinal: Negative.   Musculoskeletal:  Positive for arthralgias.  Neurological: Negative.   Psychiatric/Behavioral:  Positive for sleep disturbance. Negative for decreased concentration, self-injury and suicidal ideas. The patient is not nervous/anxious.    Per HPI unless specifically indicated above     Objective:    BP 120/72 (BP Location: Left Arm, Patient Position: Sitting, Cuff Size: Large)   Pulse 69   Temp 98.8 F (37.1 C) (Oral)   Resp 15   Ht 6' 3 (1.905 m)   Wt 228  lb (103.4 kg)    SpO2 99%   BMI 28.50 kg/m   Wt Readings from Last 3 Encounters:  09/16/23 228 lb (103.4 kg)  09/07/23 228 lb 6.3 oz (103.6 kg)  08/16/23 213 lb 3 oz (96.7 kg)    Physical Exam Vitals and nursing note reviewed.  Constitutional:      General: He is awake. He is not in acute distress.    Appearance: He is well-developed and well-groomed. He is obese. He is not ill-appearing or toxic-appearing.  HENT:     Head: Normocephalic and atraumatic.     Right Ear: Hearing normal. No drainage.     Left Ear: Hearing normal. No drainage.  Eyes:     General: Lids are normal.        Right eye: No discharge.        Left eye: No discharge.     Conjunctiva/sclera: Conjunctivae normal.     Pupils: Pupils are equal, round, and reactive to light.  Neck:     Thyroid : No thyromegaly.     Vascular: No carotid bruit.     Trachea: Trachea normal.  Cardiovascular:     Rate and Rhythm: Normal rate and regular rhythm.     Pulses:          Dorsalis pedis pulses are 1+ on the left side.       Posterior tibial pulses are 1+ on the left side.     Heart sounds: Normal heart sounds, S1 normal and S2 normal. No murmur heard.    No gallop.     Comments: Arteriovenous access now to left chest. Pulmonary:     Effort: Pulmonary effort is normal. No accessory muscle usage or respiratory distress.     Breath sounds: Normal breath sounds. No decreased breath sounds, wheezing or rales.  Abdominal:     General: Bowel sounds are normal. There is no distension.     Palpations: Abdomen is soft.     Tenderness: There is no abdominal tenderness.  Musculoskeletal:        General: Normal range of motion.     Cervical back: Normal range of motion and neck supple.     Left lower leg: No edema.     Right Lower Extremity: Right leg is amputated above knee.  Skin:    General: Skin is warm and dry.     Capillary Refill: Capillary refill takes less than 2 seconds.     Findings: Wound present. No rash.     Comments: Wound  care done this morning by nurse and area wrapped.    Neurological:     Mental Status: [name] and oriented to person, place, and time.     Deep Tendon Reflexes: Reflexes are normal and symmetric.  Psychiatric:        Attention and Perception: Attention normal.        Mood and Affect: Mood normal.        Speech: Speech normal.        Behavior: Behavior normal. Behavior is cooperative.        Thought Content: Thought content normal.        Judgment: Judgment normal.     Results for orders placed or performed during the hospital encounter of 08/29/23  CBC with Differential   Collection Time: 08/29/23  7:58 PM  Result Value Ref Range   WBC 3.8 (L) 4.0 - 10.5 K/uL   RBC 2.65 (L) 4.22 - 5.81 MIL/uL   Hemoglobin  7.4 (L) 13.0 - 17.0 g/dL   HCT 74.8 (L) 60.9 - 47.9 %   MCV 94.7 80.0 - 100.0 fL   MCH 27.9 26.0 - 34.0 pg   MCHC 29.5 (L) 30.0 - 36.0 g/dL   RDW 80.3 (H) 88.4 - 84.4 %   Platelets 95 (L) 150 - 400 K/uL   nRBC 0.0 0.0 - 0.2 %   Neutrophils Relative % 62 %   Neutro Abs 2.4 1.7 - 7.7 K/uL   Lymphocytes Relative 19 %   Lymphs Abs 0.7 0.7 - 4.0 K/uL   Monocytes Relative 13 %   Monocytes Absolute 0.5 0.1 - 1.0 K/uL   Eosinophils Relative 4 %   Eosinophils Absolute 0.2 0.0 - 0.5 K/uL   Basophils Relative 1 %   Basophils Absolute 0.0 0.0 - 0.1 K/uL   Immature Granulocytes 1 %   Abs Immature Granulocytes 0.02 0.00 - 0.07 K/uL  Comprehensive metabolic panel   Collection Time: 08/29/23  7:58 PM  Result Value Ref Range   Sodium 138 135 - 145 mmol/L   Potassium 4.4 3.5 - 5.1 mmol/L   Chloride 98 98 - 111 mmol/L   CO2 25 22 - 32 mmol/L   Glucose, Bld 97 70 - 99 mg/dL   BUN 41 (H) 8 - 23 mg/dL   Creatinine, Ser 0.11 (H) 0.61 - 1.24 mg/dL   Calcium  8.3 (L) 8.9 - 10.3 mg/dL   Total Protein 6.7 6.5 - 8.1 g/dL   Albumin  2.7 (L) 3.5 - 5.0 g/dL   AST 22 15 - 41 U/L   ALT 13 0 - 44 U/L   Alkaline Phosphatase 69 38 - 126 U/L   Total Bilirubin 0.9 0.0 - 1.2 mg/dL   GFR,  Estimated 5 (L) >60 mL/min   Anion gap 15 5 - 15  Troponin I (High Sensitivity)   Collection Time: 08/29/23  7:58 PM  Result Value Ref Range   Troponin I (High Sensitivity) 107 (HH) <18 ng/L  Resp panel by RT-PCR (RSV, Flu A&B, Covid) Anterior Nasal Swab   Collection Time: 08/29/23  8:00 PM   Specimen: Anterior Nasal Swab  Result Value Ref Range   SARS Coronavirus 2 by RT PCR NEGATIVE NEGATIVE   Influenza A by PCR NEGATIVE NEGATIVE   Influenza B by PCR NEGATIVE NEGATIVE   Resp Syncytial Virus by PCR NEGATIVE NEGATIVE  Blood Culture (routine x 2)   Collection Time: 08/30/23 12:06 AM   Specimen: BLOOD RIGHT HAND  Result Value Ref Range   Specimen Description      BLOOD RIGHT HAND Performed at [facility], [address] [place], [place] [address]    Special Requests      BOTTLES DRAWN AEROBIC AND ANAEROBIC Blood Culture results may not be optimal due to an inadequate volume of blood received in culture bottles Performed at [facility], [address] [place], [place] [address]    Culture  Setup Time      GRAM POSITIVE COCCI IN BOTH AEROBIC AND ANAEROBIC BOTTLES CRITICAL RESULT CALLED TO, READ BACK BY AND VERIFIED WITH: MOSE BLEW 08/30/23 1230 MW Performed at [facility], [address] [place], [place] [address]    Culture (A)     STAPHYLOCOCCUS AUREUS SUSCEPTIBILITIES PERFORMED ON PREVIOUS CULTURE WITHIN THE LAST 5 DAYS. Performed at [facility], [address] [place], [place] [address]    Report Status 09/01/2023 FINAL   Blood Culture (routine x 2)   Collection Time:  08/30/23 12:06 AM   Specimen: BLOOD RIGHT ARM  Result Value Ref Range   Specimen Description      BLOOD RIGHT ARM Performed at [facility], [address] [place], [place] [address]    Special Requests      BOTTLES DRAWN AEROBIC AND ANAEROBIC Blood Culture results may not be optimal due to an inadequate volume of blood received in culture  bottles Performed at [facility], [address] [place], [place] [address]    Culture  Setup Time      GRAM POSITIVE COCCI IN BOTH AEROBIC AND ANAEROBIC BOTTLES CRITICAL RESULT CALLED TO, READ BACK BY AND VERIFIED WITH: TREY GREENWOOD 08/30/23 1230 MW GRAM POSITIVE RODS ANAEROBIC BOTTLE ONLY CRITICAL RESULT CALLED TO, READ BACK BY AND VERIFIED WITH: PHARMD W. ANDERSON [id number] @1700  FH Performed at [facility], [address] [place], [place] [address]    Culture (A)     METHICILLIN RESISTANT STAPHYLOCOCCUS AUREUS CLOSTRIDIUM PERFRINGENS    Report Status 09/01/2023 FINAL    Organism ID, Bacteria METHICILLIN RESISTANT STAPHYLOCOCCUS AUREUS       Susceptibility   Methicillin resistant staphylococcus aureus - MIC*    CIPROFLOXACIN >=8 RESISTANT Resistant     ERYTHROMYCIN >=8 RESISTANT Resistant     GENTAMICIN <=0.5 SENSITIVE Sensitive     OXACILLIN >=4 RESISTANT Resistant     TETRACYCLINE >=16 RESISTANT Resistant     VANCOMYCIN  1 SENSITIVE Sensitive     TRIMETH /SULFA  <=10 SENSITIVE Sensitive     CLINDAMYCIN >=8 RESISTANT Resistant     RIFAMPIN <=0.5 SENSITIVE Sensitive     Inducible Clindamycin NEGATIVE Sensitive     LINEZOLID  2 SENSITIVE Sensitive     * METHICILLIN RESISTANT STAPHYLOCOCCUS AUREUS  Blood Culture ID Panel (Reflexed)   Collection Time: 08/30/23 12:06 AM  Result Value Ref Range   Enterococcus faecalis NOT DETECTED NOT DETECTED   Enterococcus Faecium NOT DETECTED NOT DETECTED   Listeria monocytogenes NOT DETECTED NOT DETECTED   Staphylococcus species DETECTED (A) NOT DETECTED   Staphylococcus aureus (BCID) DETECTED (A) NOT DETECTED   Staphylococcus epidermidis NOT DETECTED NOT DETECTED   Staphylococcus lugdunensis NOT DETECTED NOT DETECTED   Streptococcus species NOT DETECTED NOT DETECTED   Streptococcus agalactiae NOT DETECTED NOT DETECTED   Streptococcus pneumoniae NOT DETECTED NOT DETECTED   Streptococcus pyogenes NOT DETECTED NOT DETECTED    A.calcoaceticus-baumannii NOT DETECTED NOT DETECTED   Bacteroides fragilis NOT DETECTED NOT DETECTED   Enterobacterales NOT DETECTED NOT DETECTED   Enterobacter cloacae complex NOT DETECTED NOT DETECTED   Escherichia coli NOT DETECTED NOT DETECTED   Klebsiella aerogenes NOT DETECTED NOT DETECTED   Klebsiella oxytoca NOT DETECTED NOT DETECTED   Klebsiella pneumoniae NOT DETECTED NOT DETECTED   Proteus species NOT DETECTED NOT DETECTED   Salmonella species NOT DETECTED NOT DETECTED   Serratia marcescens NOT DETECTED NOT DETECTED   Haemophilus influenzae NOT DETECTED NOT DETECTED   Neisseria meningitidis NOT DETECTED NOT DETECTED   Pseudomonas aeruginosa NOT DETECTED NOT DETECTED   Stenotrophomonas maltophilia NOT DETECTED NOT DETECTED   Candida albicans NOT DETECTED NOT DETECTED   Candida auris NOT DETECTED NOT DETECTED   Candida glabrata NOT DETECTED NOT DETECTED   Candida krusei NOT DETECTED NOT DETECTED   Candida parapsilosis NOT DETECTED NOT DETECTED   Candida tropicalis NOT DETECTED NOT DETECTED   Cryptococcus neoformans/gattii NOT DETECTED NOT DETECTED   Meth resistant mecA/C and MREJ DETECTED (A) NOT DETECTED  Troponin I (High Sensitivity)   Collection  Time: 08/30/23 12:06 AM  Result Value Ref Range   Troponin I (High Sensitivity) 98 (H) <18 ng/L  Basic metabolic panel   Collection Time: 08/30/23  4:05 AM  Result Value Ref Range   Sodium 138 135 - 145 mmol/L   Potassium 4.7 3.5 - 5.1 mmol/L   Chloride 94 (L) 98 - 111 mmol/L   CO2 31 22 - 32 mmol/L   Glucose, Bld 94 70 - 99 mg/dL   BUN 47 (H) 8 - 23 mg/dL   Creatinine, Ser 89.64 (H) 0.61 - 1.24 mg/dL   Calcium  8.1 (L) 8.9 - 10.3 mg/dL   GFR, Estimated 5 (L) >60 mL/min   Anion gap 13 5 - 15  CBC   Collection Time: 08/30/23  4:05 AM  Result Value Ref Range   WBC 4.3 4.0 - 10.5 K/uL   RBC 2.51 (L) 4.22 - 5.81 MIL/uL   Hemoglobin 7.0 (L) 13.0 - 17.0 g/dL   HCT 76.2 (L) 60.9 - 47.9 %   MCV 94.4 80.0 - 100.0 fL   MCH  27.9 26.0 - 34.0 pg   MCHC 29.5 (L) 30.0 - 36.0 g/dL   RDW 80.4 (H) 88.4 - 84.4 %   Platelets 99 (L) 150 - 400 K/uL   nRBC 0.0 0.0 - 0.2 %  Hepatitis B surface antigen   Collection Time: 08/30/23  4:05 AM  Result Value Ref Range   Hepatitis B Surface Ag NON REACTIVE NON REACTIVE  Hepatitis B surface antibody,quantitative   Collection Time: 08/30/23  4:05 AM  Result Value Ref Range   Hep B S AB Quant (Post) 348.0 Immunity>10 mIU/mL  Prepare RBC (crossmatch)   Collection Time: 08/30/23  4:28 AM  Result Value Ref Range   Order Confirmation      ORDER PROCESSED BY BLOOD BANK Performed at [facility], [address] [place], [place] [address]   Type and screen [facility]   Collection Time: 08/30/23  4:50 AM  Result Value Ref Range   ABO/RH(D) A POS    Antibody Screen NEG    Sample Expiration 09/02/2023,2359    Unit Number [id number]    Blood Component Type RED CELLS,LR    Unit division 00    Status of Unit REL FROM [facility]    Transfusion Status OK TO TRANSFUSE    Crossmatch Result      Compatible Performed at [facility], [address] [place], [place] [address]    Unit Number [id number]    Blood Component Type RED CELLS,LR    Unit division 00    Status of Unit ISSUED,FINAL    Transfusion Status OK TO TRANSFUSE    Crossmatch Result Compatible   BPAM RBC   Collection Time: 08/30/23  4:50 AM  Result Value Ref Range   Blood Product Unit Number [id number]    PRODUCT CODE [id number]    Unit Type and Rh 6200    Blood Product Expiration Date 797492817640    ISSUE DATE / TIME [id number]    Blood Product Unit Number [id number]    PRODUCT CODE [id number]    Unit Type and Rh 6200    Blood Product Expiration Date 797492847640   Prepare RBC (crossmatch)   Collection Time: 08/30/23 10:08 AM  Result Value Ref Range   Order Confirmation      DUPLICATE REQUEST Performed at [facility], [address]  [place], [place] [address]   MRSA Next Gen by PCR, Nasal   Collection  Time: 08/30/23  2:53 PM   Specimen: Nasal Mucosa; Nasal Swab  Result Value Ref Range   MRSA by PCR Next Gen DETECTED (A) NOT DETECTED  Hemoglobin and hematocrit, blood   Collection Time: 08/30/23  5:43 PM  Result Value Ref Range   Hemoglobin 8.8 (L) 13.0 - 17.0 g/dL   HCT 71.0 (L) 60.9 - 47.9 %  CBC   Collection Time: 08/31/23  3:47 AM  Result Value Ref Range   WBC 5.2 4.0 - 10.5 K/uL   RBC 3.10 (L) 4.22 - 5.81 MIL/uL   Hemoglobin 8.7 (L) 13.0 - 17.0 g/dL   HCT 71.4 (L) 60.9 - 47.9 %   MCV 91.9 80.0 - 100.0 fL   MCH 28.1 26.0 - 34.0 pg   MCHC 30.5 30.0 - 36.0 g/dL   RDW 81.0 (H) 88.4 - 84.4 %   Platelets 88 (L) 150 - 400 K/uL   nRBC 0.0 0.0 - 0.2 %  Basic metabolic panel with GFR   Collection Time: 08/31/23  3:47 AM  Result Value Ref Range   Sodium 133 (L) 135 - 145 mmol/L   Potassium 4.3 3.5 - 5.1 mmol/L   Chloride 95 (L) 98 - 111 mmol/L   CO2 25 22 - 32 mmol/L   Glucose, Bld 87 70 - 99 mg/dL   BUN 32 (H) 8 - 23 mg/dL   Creatinine, Ser 2.81 (H) 0.61 - 1.24 mg/dL   Calcium  8.0 (L) 8.9 - 10.3 mg/dL   GFR, Estimated 8 (L) >60 mL/min   Anion gap 13 5 - 15  Cath Tip Culture   Collection Time: 08/31/23  8:50 AM   Specimen: Catheter Tip; Other  Result Value Ref Range   Specimen Description      CATH TIP Performed at [facility], [address] [place], [place] [address]    Special Requests      NONE Performed at [facility], [address] [place], [place] [address]    Culture      NO GROWTH 2 DAYS Performed at [facility], [address] [place], [place] [address]    Report Status 09/02/2023 FINAL   Culture, blood (single) w Reflex to ID Panel   Collection Time: 08/31/23 10:12 AM   Specimen: BLOOD  Result Value Ref Range   Specimen Description BLOOD RIGHT FA    Special Requests      BOTTLES DRAWN AEROBIC AND ANAEROBIC Blood Culture adequate volume   Culture      NO  GROWTH 5 DAYS Performed at [facility], [address] [place], [place] [address]    Report Status 09/05/2023 FINAL   CBC   Collection Time: 09/01/23  4:16 AM  Result Value Ref Range   WBC 4.5 4.0 - 10.5 K/uL   RBC 2.91 (L) 4.22 - 5.81 MIL/uL   Hemoglobin 8.0 (L) 13.0 - 17.0 g/dL   HCT 73.5 (L) 60.9 - 47.9 %   MCV 90.7 80.0 - 100.0 fL   MCH 27.5 26.0 - 34.0 pg   MCHC 30.3 30.0 - 36.0 g/dL   RDW 81.4 (H) 88.4 - 84.4 %   Platelets 91 (L) 150 - 400 K/uL   nRBC 0.0 0.0 - 0.2 %  Basic metabolic panel with GFR   Collection Time: 09/01/23  4:16 AM  Result Value Ref Range   Sodium 133 (L) 135 - 145 mmol/L   Potassium 4.3 3.5 - 5.1 mmol/L   Chloride 94 (L) 98 -  111 mmol/L   CO2 27 22 - 32 mmol/L   Glucose, Bld 100 (H) 70 - 99 mg/dL   BUN 41 (H) 8 - 23 mg/dL   Creatinine, Ser 0.85 (H) 0.61 - 1.24 mg/dL   Calcium  8.2 (L) 8.9 - 10.3 mg/dL   GFR, Estimated 6 (L) >60 mL/min   Anion gap 12 5 - 15  ECHOCARDIOGRAM LIMITED   Collection Time: 09/01/23 10:02 AM  Result Value Ref Range   Weight 3,446.23 oz   Height 75 in   BP 112/59 mmHg   S' Lateral 2.70 cm   Area-P 1/2 4.24 cm2   Est EF 60 - 65%   Aerobic/Anaerobic Culture w Gram Stain (surgical/deep wound)   Collection Time: 09/01/23 12:21 PM   Specimen: Path Tissue  Result Value Ref Range   Specimen Description      TISSUE Performed at [facility], [address], [place], [place] [address]    Special Requests      NONE Performed at [facility], [address] [place], [place] [address]    Gram Stain NO WBC SEEN NO ORGANISMS SEEN     Culture      FEW METHICILLIN RESISTANT STAPHYLOCOCCUS AUREUS NO ANAEROBES ISOLATED Performed at [facility], [address], [place], [place] [address]    Report Status 09/06/2023 FINAL    Organism ID, Bacteria METHICILLIN RESISTANT STAPHYLOCOCCUS AUREUS       Susceptibility   Methicillin resistant staphylococcus aureus - MIC*    CIPROFLOXACIN >=8  RESISTANT Resistant     ERYTHROMYCIN >=8 RESISTANT Resistant     GENTAMICIN <=0.5 SENSITIVE Sensitive     OXACILLIN >=4 RESISTANT Resistant     TETRACYCLINE >=16 RESISTANT Resistant     VANCOMYCIN  1 SENSITIVE Sensitive     TRIMETH /SULFA  <=10 SENSITIVE Sensitive     CLINDAMYCIN >=8 RESISTANT Resistant     RIFAMPIN <=0.5 SENSITIVE Sensitive     Inducible Clindamycin NEGATIVE Sensitive     LINEZOLID  2 SENSITIVE Sensitive     * FEW METHICILLIN RESISTANT STAPHYLOCOCCUS AUREUS  Glucose, capillary   Collection Time: 09/01/23  1:26 PM  Result Value Ref Range   Glucose-Capillary 96 70 - 99 mg/dL  Culture, blood (Routine X 2) w Reflex to ID Panel   Collection Time: 09/02/23  8:08 AM   Specimen: BLOOD  Result Value Ref Range   Specimen Description BLOOD RIGHT ANTECUBITAL    Special Requests      BOTTLES DRAWN AEROBIC AND ANAEROBIC Blood Culture adequate volume   Culture      NO GROWTH 5 DAYS Performed at [facility], [address], [place], [place] [address]    Report Status 09/07/2023 FINAL   Culture, blood (Routine X 2) w Reflex to ID Panel   Collection Time: 09/02/23  8:08 AM   Specimen: BLOOD  Result Value Ref Range   Specimen Description BLOOD BLOOD RIGHT FOREARM    Special Requests      BOTTLES DRAWN AEROBIC AND ANAEROBIC Blood Culture adequate volume   Culture      NO GROWTH 5 DAYS Performed at [facility], [address] [place], [place] [address]    Report Status 09/07/2023 FINAL   Renal function panel   Collection Time: 09/03/23  4:17 AM  Result Value Ref Range   Sodium 135 135 - 145 mmol/L   Potassium 4.7 3.5 - 5.1 mmol/L   Chloride 94 (L) 98 - 111 mmol/L   CO2 28 22 -  32 mmol/L   Glucose, Bld 142 (H) 70 - 99 mg/dL   BUN 60 (H) 8 - 23 mg/dL   Creatinine, Ser 87.58 (H) 0.61 - 1.24 mg/dL   Calcium  8.1 (L) 8.9 - 10.3 mg/dL   Phosphorus 5.0 (H) 2.5 - 4.6 mg/dL   Albumin  2.4 (L) 3.5 - 5.0 g/dL   GFR, Estimated 4 (L) >60 mL/min   Anion gap 13 5  - 15  CBC   Collection Time: 09/03/23  4:17 AM  Result Value Ref Range   WBC 4.6 4.0 - 10.5 K/uL   RBC 2.62 (L) 4.22 - 5.81 MIL/uL   Hemoglobin 7.7 (L) 13.0 - 17.0 g/dL   HCT 75.7 (L) 60.9 - 47.9 %   MCV 92.4 80.0 - 100.0 fL   MCH 29.4 26.0 - 34.0 pg   MCHC 31.8 30.0 - 36.0 g/dL   RDW 81.7 (H) 88.4 - 84.4 %   Platelets 158 150 - 400 K/uL   nRBC 0.6 (H) 0.0 - 0.2 %  ECHO TEE   Collection Time: 09/03/23  1:38 PM  Result Value Ref Range   Est EF 55 - 60%   Expectorated Sputum Assessment w Gram Stain, Rflx to Resp Cult   Collection Time: 09/04/23  3:13 AM   Specimen: Expectorated Sputum  Result Value Ref Range   Specimen Description EXPECTORATED SPUTUM    Special Requests NONE    Sputum evaluation      Sputum specimen not acceptable for testing.  Please recollect.   RESULT CALLED TO, READ BACK BY AND VERIFIED WITH: MARCELITA WINT @ 09/04/23 0448 AB Performed at [facility], [address] [place], [place] [address]    Report Status 09/04/2023 FINAL   Renal function panel   Collection Time: 09/04/23  4:37 AM  Result Value Ref Range   Sodium 134 (L) 135 - 145 mmol/L   Potassium 4.2 3.5 - 5.1 mmol/L   Chloride 97 (L) 98 - 111 mmol/L   CO2 26 22 - 32 mmol/L   Glucose, Bld 102 (H) 70 - 99 mg/dL   BUN 33 (H) 8 - 23 mg/dL   Creatinine, Ser 2.05 (H) 0.61 - 1.24 mg/dL   Calcium  8.3 (L) 8.9 - 10.3 mg/dL   Phosphorus 3.4 2.5 - 4.6 mg/dL   Albumin  2.6 (L) 3.5 - 5.0 g/dL   GFR, Estimated 7 (L) >60 mL/min   Anion gap 11 5 - 15  CBC   Collection Time: 09/04/23  4:37 AM  Result Value Ref Range   WBC 4.1 4.0 - 10.5 K/uL   RBC 3.04 (L) 4.22 - 5.81 MIL/uL   Hemoglobin 8.3 (L) 13.0 - 17.0 g/dL   HCT 72.4 (L) 60.9 - 47.9 %   MCV 90.5 80.0 - 100.0 fL   MCH 27.3 26.0 - 34.0 pg   MCHC 30.2 30.0 - 36.0 g/dL   RDW 82.0 (H) 88.4 - 84.4 %   Platelets 211 150 - 400 K/uL   nRBC 1.0 (H) 0.0 - 0.2 %  Vancomycin , random   Collection Time: 09/06/23  3:23 AM  Result Value Ref Range    Vancomycin  Rm 32 ug/mL  Renal function panel   Collection Time: 09/06/23  3:23 AM  Result Value Ref Range   Sodium 133 (L) 135 - 145 mmol/L   Potassium 5.0 3.5 - 5.1 mmol/L   Chloride 97 (L) 98 - 111 mmol/L   CO2 23 22 - 32 mmol/L   Glucose, Bld 102 (H) 70 -  99 mg/dL   BUN 48 (H) 8 - 23 mg/dL   Creatinine, Ser 88.91 (H) 0.61 - 1.24 mg/dL   Calcium  8.1 (L) 8.9 - 10.3 mg/dL   Phosphorus 3.7 2.5 - 4.6 mg/dL   Albumin  2.4 (L) 3.5 - 5.0 g/dL   GFR, Estimated 5 (L) >60 mL/min   Anion gap 13 5 - 15  CBC   Collection Time: 09/06/23  3:23 AM  Result Value Ref Range   WBC 5.0 4.0 - 10.5 K/uL   RBC 3.21 (L) 4.22 - 5.81 MIL/uL   Hemoglobin 8.6 (L) 13.0 - 17.0 g/dL   HCT 70.9 (L) 60.9 - 47.9 %   MCV 90.3 80.0 - 100.0 fL   MCH 26.8 26.0 - 34.0 pg   MCHC 29.7 (L) 30.0 - 36.0 g/dL   RDW 81.9 (H) 88.4 - 84.4 %   Platelets 225 150 - 400 K/uL   nRBC 0.0 0.0 - 0.2 %  Renal function panel   Collection Time: 09/07/23  4:03 AM  Result Value Ref Range   Sodium 134 (L) 135 - 145 mmol/L   Potassium 4.1 3.5 - 5.1 mmol/L   Chloride 97 (L) 98 - 111 mmol/L   CO2 28 22 - 32 mmol/L   Glucose, Bld 105 (H) 70 - 99 mg/dL   BUN 28 (H) 8 - 23 mg/dL   Creatinine, Ser 2.71 (H) 0.61 - 1.24 mg/dL   Calcium  8.0 (L) 8.9 - 10.3 mg/dL   Phosphorus 3.2 2.5 - 4.6 mg/dL   Albumin  2.4 (L) 3.5 - 5.0 g/dL   GFR, Estimated 8 (L) >60 mL/min   Anion gap 9 5 - 15  CBC   Collection Time: 09/07/23  4:03 AM  Result Value Ref Range   WBC 5.6 4.0 - 10.5 K/uL   RBC 2.95 (L) 4.22 - 5.81 MIL/uL   Hemoglobin 8.3 (L) 13.0 - 17.0 g/dL   HCT 73.2 (L) 60.9 - 47.9 %   MCV 90.5 80.0 - 100.0 fL   MCH 28.1 26.0 - 34.0 pg   MCHC 31.1 30.0 - 36.0 g/dL   RDW 81.9 (H) 88.4 - 84.4 %   Platelets 242 150 - 400 K/uL   nRBC 0.5 (H) 0.0 - 0.2 %      Assessment & Plan:   Problem List Items Addressed This Visit       Respiratory   Aspiration pneumonia (HCC) - Primary   Acute and improved at this time.  Lung sounds overall clear.   May benefit speech therapy in future. Attempted to obtain labs today, but lab unable to obtain due to poor access.  Will plan on labs next visit or sooner if any worsening symptoms return.      Relevant Orders   Ambulatory referral to Home Health   Ambulatory referral to Infectious Disease   Acute hypoxic respiratory failure (HCC)   Acute and improved at this time.  Attempted to obtain labs today, but lab unable to obtain due to poor access.  Will plan on labs next visit or sooner if any worsening symptoms return.        Endocrine   Type 2 diabetes mellitus with ESRD (end-stage renal disease) (HCC)   Chronic, ongoing with A1c 6% in May, remaining stable.  Unable to obtain urine for ALB check.  At goal.  Continue off medications at this time.  Recommend he monitor BS at least a couple times a day and document for provider + focus on diabetic diet.  Return to office in 3 months for follow-up.   - Needs to schedule eye exam, recommend he do so. - Foot exam up to date. - Vaccination, refuses some. - Statin on board.  ESRD, no ACE or ARB at this time.      Relevant Orders   Ambulatory referral to Ophthalmology   Ambulatory referral to Sleep Studies   Ambulatory referral to Home Health   Hyperlipidemia associated with type 2 diabetes mellitus (HCC)   Chronic, ongoing.  Continue current medication regimen and adjust as needed.  Attempted to obtain labs today, but lab unable to obtain due to poor access.  Will plan on labs next visit or sooner if any worsening symptoms return.         Musculoskeletal and Integument   Venous stasis ulcer of left lower leg (HCC)   Ongoing and patient reports improving, becoming smaller.  Continue home health nurse for wound care and collaboration with vascular.  Get back into wound clinic as needed.        Other   Right hand pain   This is dominant hand.  Will get PT and OT into home, OT to work on hand pain and PT to assist with balance and walker use.   Recommend Tylenol  as needed and ensure gentle stretching daily.  Suspect OA.      MRSA bacteremia   Is to follow-up with ID per discharge notes, will place referral.      Relevant Orders   Ambulatory referral to Infectious Disease   Insomnia   Chronic, ongoing.  Return to Ambien  which offered more benefit, has controlled subs agreement on chart.  Adjust regimen as needed.  Refills sent.  Will plan on repeat sleep study and see if need for CPAP.      Relevant Orders   Ambulatory referral to Sleep Studies   Chronic pain syndrome   Ongoing, taking Norco prior to dialysis, has done this for some time  Controlled substance contract up to date.  Small refill sent and he is aware needs to use this only before dialysis, if needs more frequent or higher doses then will need to attend pain clinic.      Relevant Medications   oxyCODONE -acetaminophen  (PERCOCET/ROXICET) 5-325 MG tablet   Other Visit Diagnoses       Colon cancer screening       GI referral.   Relevant Orders   Amb Referral to Colonoscopy        Follow up plan: Return in about 3 months (around 12/17/2023) for T2DM, HTN/HLD, ESRD.

## 2023-09-16 NOTE — Assessment & Plan Note (Signed)
 Chronic, ongoing.  Continue current medication regimen and adjust as needed.  Attempted to obtain labs today, but lab unable to obtain due to poor access.  Will plan on labs next visit or sooner if any worsening symptoms return.

## 2023-09-16 NOTE — Assessment & Plan Note (Signed)
 Chronic, ongoing.  Return to Ambien  which offered more benefit, has controlled subs agreement on chart.  Adjust regimen as needed.  Refills sent.  Will plan on repeat sleep study and see if need for CPAP.

## 2023-09-16 NOTE — Assessment & Plan Note (Signed)
 Acute and improved at this time.  Attempted to obtain labs today, but lab unable to obtain due to poor access.  Will plan on labs next visit or sooner if any worsening symptoms return.

## 2023-09-16 NOTE — Telephone Encounter (Signed)
 Patient called the office and stated that the sleeping meds that were prescribed by the hospital was not working  he stated that he mentioned it to the provider at the appt today and that the RX was not sent. He would like  Ambien  and pain medication.Please advise.

## 2023-09-16 NOTE — Assessment & Plan Note (Signed)
 Ongoing, taking Norco prior to dialysis, has done this for some time  Controlled substance contract up to date.  Small refill sent and he is aware needs to use this only before dialysis, if needs more frequent or higher doses then will need to attend pain clinic.

## 2023-09-16 NOTE — Telephone Encounter (Signed)
Unable to leave message for patient his mailbox is full

## 2023-09-16 NOTE — Assessment & Plan Note (Signed)
 Chronic, ongoing with A1c 6% in May, remaining stable.  Unable to obtain urine for ALB check.  At goal.  Continue off medications at this time.  Recommend he monitor BS at least a couple times a day and document for provider + focus on diabetic diet.  Return to office in 3 months for follow-up.   - Needs to schedule eye exam, recommend he do so. - Foot exam up to date. - Vaccination, refuses some. - Statin on board.  ESRD, no ACE or ARB at this time.

## 2023-09-17 DIAGNOSIS — D509 Iron deficiency anemia, unspecified: Secondary | ICD-10-CM | POA: Diagnosis not present

## 2023-09-17 DIAGNOSIS — Z992 Dependence on renal dialysis: Secondary | ICD-10-CM | POA: Diagnosis not present

## 2023-09-17 DIAGNOSIS — D689 Coagulation defect, unspecified: Secondary | ICD-10-CM | POA: Diagnosis not present

## 2023-09-17 DIAGNOSIS — N186 End stage renal disease: Secondary | ICD-10-CM | POA: Diagnosis not present

## 2023-09-17 DIAGNOSIS — E8779 Other fluid overload: Secondary | ICD-10-CM | POA: Diagnosis not present

## 2023-09-17 DIAGNOSIS — N2581 Secondary hyperparathyroidism of renal origin: Secondary | ICD-10-CM | POA: Diagnosis not present

## 2023-09-17 DIAGNOSIS — R52 Pain, unspecified: Secondary | ICD-10-CM | POA: Diagnosis not present

## 2023-09-20 ENCOUNTER — Other Ambulatory Visit: Payer: Self-pay

## 2023-09-20 ENCOUNTER — Telehealth: Payer: Self-pay | Admitting: Nurse Practitioner

## 2023-09-20 ENCOUNTER — Telehealth: Payer: Self-pay

## 2023-09-20 DIAGNOSIS — N186 End stage renal disease: Secondary | ICD-10-CM | POA: Diagnosis not present

## 2023-09-20 DIAGNOSIS — D509 Iron deficiency anemia, unspecified: Secondary | ICD-10-CM | POA: Diagnosis not present

## 2023-09-20 DIAGNOSIS — N2581 Secondary hyperparathyroidism of renal origin: Secondary | ICD-10-CM | POA: Diagnosis not present

## 2023-09-20 DIAGNOSIS — Z992 Dependence on renal dialysis: Secondary | ICD-10-CM | POA: Diagnosis not present

## 2023-09-20 DIAGNOSIS — E8779 Other fluid overload: Secondary | ICD-10-CM | POA: Diagnosis not present

## 2023-09-20 DIAGNOSIS — D689 Coagulation defect, unspecified: Secondary | ICD-10-CM | POA: Diagnosis not present

## 2023-09-20 DIAGNOSIS — R52 Pain, unspecified: Secondary | ICD-10-CM | POA: Diagnosis not present

## 2023-09-20 MED ORDER — ROSUVASTATIN CALCIUM 40 MG PO TABS: 40.0000 mg | ORAL_TABLET | Freq: Every day | ORAL | 1 refills | Status: DC

## 2023-09-20 MED ORDER — ELIQUIS 2.5 MG PO TABS: 2.5000 mg | ORAL_TABLET | Freq: Two times a day (BID) | ORAL | 1 refills | Status: DC

## 2023-09-20 MED ORDER — CARVEDILOL 6.25 MG PO TABS: 6.2500 mg | ORAL_TABLET | Freq: Every day | ORAL | 2 refills | Status: DC

## 2023-09-20 MED ORDER — AMIODARONE HCL 200 MG PO TABS: 200.0000 mg | ORAL_TABLET | Freq: Two times a day (BID) | ORAL | 4 refills | Status: DC

## 2023-09-20 NOTE — Telephone Encounter (Signed)
 Routing to provider. All prescriptions were sent to [facility] on [address] and patient would like them sent to [facility] on [address]. Can we resend for the patient?

## 2023-09-20 NOTE — Telephone Encounter (Signed)
 Copied from CRM [id number]. Topic: Clinical - Prescription Issue >> Sep 20, 2023  1:44 PM Fonda T wrote: Reason for CRM: Patient calling, states he just spoke with pharmacy, and stated per pharmacy, no refill request has been received from office.  Patient is calling to check the status of refills. Patient states All medications should be going to pharmacy as verified below.   Per chart review, several were sent to incorrect pharmacy.   Updated pharmacy list to indicated primary choice of pharmacy.   Preferred pharmacy:   [facility] [address]), [place] - [address] [place]) [place] [address] Phone: [phone] Fax: [phone]  Patient can be reached to discuss further, at [phone].

## 2023-09-20 NOTE — Telephone Encounter (Signed)
 Copied from CRM [id number]. Topic: General - Other >> Sep 20, 2023 11:14 AM Emylou G wrote: Reason for CRM: Patient called.. needs refill or script for his water pills.. He doesn't know the name of them? >> Sep 20, 2023 12:56 PM Donna BRAVO wrote: Patient returning call for Jolene Cannady NP

## 2023-09-20 NOTE — Telephone Encounter (Signed)
 Is the patient on any fluid pills?

## 2023-09-20 NOTE — Telephone Encounter (Signed)
 Copied from CRM [id number]. Topic: Clinical - Medication Refill >> Sep 20, 2023 11:28 AM Corin V wrote: Medication:  - Patient is requesting a refill on his water pills but does not know the name of them.  - He stated he needs a refill on his Xanax but I do not see that in his chart as an old medicine either. - HYDROcodone -acetaminophen  (NORCO) 10-325 MG tablet  Has the patient contacted their pharmacy? Yes (Agent: If no, request that the patient contact the pharmacy for the refill. If patient does not wish to contact the pharmacy document the reason why and proceed with request.) (Agent: If yes, when and what did the pharmacy advise?)  This is the patient's preferred pharmacy:  [facility] [address] [place] - [address] [place] [address] Phone: [phone] Fax: [phone]  Is this the correct pharmacy for this prescription? Yes If no, delete pharmacy and type the correct one.   Has the prescription been filled recently? No  Is the patient out of the medication? Yes  Has the patient been seen for an appointment in the last year OR does the patient have an upcoming appointment? Yes  Can we respond through MyChart? No  Agent: Please be advised that Rx refills may take up to 3 business days. We ask that you follow-up with your pharmacy.

## 2023-09-20 NOTE — Addendum Note (Signed)
 Addended by: Glenis Musolf T on: 09/20/2023 02:04 PM   Modules accepted: Orders

## 2023-09-20 NOTE — Telephone Encounter (Signed)
Called and notified patient of Jolene's message. Patient verbalized understanding.

## 2023-09-21 ENCOUNTER — Telehealth: Payer: Self-pay

## 2023-09-21 ENCOUNTER — Telehealth: Payer: Self-pay | Admitting: Nurse Practitioner

## 2023-09-21 DIAGNOSIS — N186 End stage renal disease: Secondary | ICD-10-CM | POA: Diagnosis not present

## 2023-09-21 DIAGNOSIS — E785 Hyperlipidemia, unspecified: Secondary | ICD-10-CM | POA: Diagnosis not present

## 2023-09-21 DIAGNOSIS — E11621 Type 2 diabetes mellitus with foot ulcer: Secondary | ICD-10-CM | POA: Diagnosis not present

## 2023-09-21 DIAGNOSIS — I509 Heart failure, unspecified: Secondary | ICD-10-CM | POA: Diagnosis not present

## 2023-09-21 DIAGNOSIS — E8779 Other fluid overload: Secondary | ICD-10-CM | POA: Diagnosis not present

## 2023-09-21 DIAGNOSIS — Z992 Dependence on renal dialysis: Secondary | ICD-10-CM | POA: Diagnosis not present

## 2023-09-21 DIAGNOSIS — Z89611 Acquired absence of right leg above knee: Secondary | ICD-10-CM | POA: Diagnosis not present

## 2023-09-21 DIAGNOSIS — Z7901 Long term (current) use of anticoagulants: Secondary | ICD-10-CM | POA: Diagnosis not present

## 2023-09-21 DIAGNOSIS — N2581 Secondary hyperparathyroidism of renal origin: Secondary | ICD-10-CM | POA: Diagnosis not present

## 2023-09-21 DIAGNOSIS — D509 Iron deficiency anemia, unspecified: Secondary | ICD-10-CM | POA: Diagnosis not present

## 2023-09-21 DIAGNOSIS — L97522 Non-pressure chronic ulcer of other part of left foot with fat layer exposed: Secondary | ICD-10-CM | POA: Diagnosis not present

## 2023-09-21 DIAGNOSIS — L97821 Non-pressure chronic ulcer of other part of left lower leg limited to breakdown of skin: Secondary | ICD-10-CM | POA: Diagnosis not present

## 2023-09-21 DIAGNOSIS — E1122 Type 2 diabetes mellitus with diabetic chronic kidney disease: Secondary | ICD-10-CM | POA: Diagnosis not present

## 2023-09-21 DIAGNOSIS — D631 Anemia in chronic kidney disease: Secondary | ICD-10-CM | POA: Diagnosis not present

## 2023-09-21 DIAGNOSIS — I87312 Chronic venous hypertension (idiopathic) with ulcer of left lower extremity: Secondary | ICD-10-CM | POA: Diagnosis not present

## 2023-09-21 DIAGNOSIS — D689 Coagulation defect, unspecified: Secondary | ICD-10-CM | POA: Diagnosis not present

## 2023-09-21 DIAGNOSIS — Z556 Problems related to health literacy: Secondary | ICD-10-CM | POA: Diagnosis not present

## 2023-09-21 DIAGNOSIS — Z993 Dependence on wheelchair: Secondary | ICD-10-CM | POA: Diagnosis not present

## 2023-09-21 DIAGNOSIS — R52 Pain, unspecified: Secondary | ICD-10-CM | POA: Diagnosis not present

## 2023-09-21 DIAGNOSIS — I132 Hypertensive heart and chronic kidney disease with heart failure and with stage 5 chronic kidney disease, or end stage renal disease: Secondary | ICD-10-CM | POA: Diagnosis not present

## 2023-09-21 NOTE — Telephone Encounter (Signed)
 Copied from CRM [id number]. Topic: Medical Record Request - Records Request >> Sep 21, 2023  2:34 PM Ivette P wrote: Reason for CRM: Rosaline  pt daughter called in to see if 3051 form papers were received and if they can be filled and faxed back .    Fax -[phone]

## 2023-09-21 NOTE — Telephone Encounter (Signed)
 Form faxed back this afternoon.   Called and notified patients daughter that this was done for them.

## 2023-09-21 NOTE — Telephone Encounter (Signed)
 Called and gave verbal order per Jolene.

## 2023-09-21 NOTE — Telephone Encounter (Signed)
 Copied from CRM [id number]. Topic: Clinical - Home Health Verbal Orders >> Sep 21, 2023 10:29 AM Donee H wrote: Caller/Agency: [place] with PT from [facility]  Callback Number: [phone] Service Requested: Physical Therapy Frequency: 1 time a week for 6 weeks Any new concerns about the patient? No

## 2023-09-22 ENCOUNTER — Telehealth: Payer: Self-pay

## 2023-09-22 DIAGNOSIS — R52 Pain, unspecified: Secondary | ICD-10-CM | POA: Diagnosis not present

## 2023-09-22 DIAGNOSIS — N186 End stage renal disease: Secondary | ICD-10-CM | POA: Diagnosis not present

## 2023-09-22 DIAGNOSIS — Z992 Dependence on renal dialysis: Secondary | ICD-10-CM | POA: Diagnosis not present

## 2023-09-22 DIAGNOSIS — N2581 Secondary hyperparathyroidism of renal origin: Secondary | ICD-10-CM | POA: Diagnosis not present

## 2023-09-22 DIAGNOSIS — E11621 Type 2 diabetes mellitus with foot ulcer: Secondary | ICD-10-CM | POA: Diagnosis not present

## 2023-09-22 DIAGNOSIS — L97829 Non-pressure chronic ulcer of other part of left lower leg with unspecified severity: Secondary | ICD-10-CM | POA: Diagnosis not present

## 2023-09-22 DIAGNOSIS — D509 Iron deficiency anemia, unspecified: Secondary | ICD-10-CM | POA: Diagnosis not present

## 2023-09-22 DIAGNOSIS — L97529 Non-pressure chronic ulcer of other part of left foot with unspecified severity: Secondary | ICD-10-CM | POA: Diagnosis not present

## 2023-09-22 DIAGNOSIS — D689 Coagulation defect, unspecified: Secondary | ICD-10-CM | POA: Diagnosis not present

## 2023-09-22 DIAGNOSIS — I87312 Chronic venous hypertension (idiopathic) with ulcer of left lower extremity: Secondary | ICD-10-CM | POA: Diagnosis not present

## 2023-09-22 DIAGNOSIS — E8779 Other fluid overload: Secondary | ICD-10-CM | POA: Diagnosis not present

## 2023-09-22 NOTE — Telephone Encounter (Unsigned)
 Copied from CRM [id number]. Topic: General - Other >> Sep 22, 2023 11:31 AM Travis F wrote: Reason for CRM: Patient's daughter Rosaline is calling in because the 3501 form that was sent to [facility] was never received by them. She is requesting it be resent.

## 2023-09-23 DIAGNOSIS — I509 Heart failure, unspecified: Secondary | ICD-10-CM | POA: Diagnosis not present

## 2023-09-23 DIAGNOSIS — Z7901 Long term (current) use of anticoagulants: Secondary | ICD-10-CM | POA: Diagnosis not present

## 2023-09-23 DIAGNOSIS — Z556 Problems related to health literacy: Secondary | ICD-10-CM | POA: Diagnosis not present

## 2023-09-23 DIAGNOSIS — N186 End stage renal disease: Secondary | ICD-10-CM | POA: Diagnosis not present

## 2023-09-23 DIAGNOSIS — I132 Hypertensive heart and chronic kidney disease with heart failure and with stage 5 chronic kidney disease, or end stage renal disease: Secondary | ICD-10-CM | POA: Diagnosis not present

## 2023-09-23 DIAGNOSIS — E785 Hyperlipidemia, unspecified: Secondary | ICD-10-CM | POA: Diagnosis not present

## 2023-09-23 DIAGNOSIS — E11621 Type 2 diabetes mellitus with foot ulcer: Secondary | ICD-10-CM | POA: Diagnosis not present

## 2023-09-23 DIAGNOSIS — Z992 Dependence on renal dialysis: Secondary | ICD-10-CM | POA: Diagnosis not present

## 2023-09-23 DIAGNOSIS — D631 Anemia in chronic kidney disease: Secondary | ICD-10-CM | POA: Diagnosis not present

## 2023-09-23 DIAGNOSIS — E1122 Type 2 diabetes mellitus with diabetic chronic kidney disease: Secondary | ICD-10-CM | POA: Diagnosis not present

## 2023-09-23 DIAGNOSIS — I87312 Chronic venous hypertension (idiopathic) with ulcer of left lower extremity: Secondary | ICD-10-CM | POA: Diagnosis not present

## 2023-09-23 DIAGNOSIS — Z89611 Acquired absence of right leg above knee: Secondary | ICD-10-CM | POA: Diagnosis not present

## 2023-09-23 DIAGNOSIS — L97821 Non-pressure chronic ulcer of other part of left lower leg limited to breakdown of skin: Secondary | ICD-10-CM | POA: Diagnosis not present

## 2023-09-23 DIAGNOSIS — Z993 Dependence on wheelchair: Secondary | ICD-10-CM | POA: Diagnosis not present

## 2023-09-23 DIAGNOSIS — L97522 Non-pressure chronic ulcer of other part of left foot with fat layer exposed: Secondary | ICD-10-CM | POA: Diagnosis not present

## 2023-09-24 DIAGNOSIS — E8779 Other fluid overload: Secondary | ICD-10-CM | POA: Diagnosis not present

## 2023-09-24 DIAGNOSIS — N2581 Secondary hyperparathyroidism of renal origin: Secondary | ICD-10-CM | POA: Diagnosis not present

## 2023-09-24 DIAGNOSIS — D509 Iron deficiency anemia, unspecified: Secondary | ICD-10-CM | POA: Diagnosis not present

## 2023-09-24 DIAGNOSIS — R52 Pain, unspecified: Secondary | ICD-10-CM | POA: Diagnosis not present

## 2023-09-24 DIAGNOSIS — N186 End stage renal disease: Secondary | ICD-10-CM | POA: Diagnosis not present

## 2023-09-24 DIAGNOSIS — D689 Coagulation defect, unspecified: Secondary | ICD-10-CM | POA: Diagnosis not present

## 2023-09-24 DIAGNOSIS — Z992 Dependence on renal dialysis: Secondary | ICD-10-CM | POA: Diagnosis not present

## 2023-09-27 DIAGNOSIS — N186 End stage renal disease: Secondary | ICD-10-CM | POA: Diagnosis not present

## 2023-09-27 DIAGNOSIS — Z992 Dependence on renal dialysis: Secondary | ICD-10-CM | POA: Diagnosis not present

## 2023-09-27 DIAGNOSIS — D509 Iron deficiency anemia, unspecified: Secondary | ICD-10-CM | POA: Diagnosis not present

## 2023-09-27 DIAGNOSIS — N2581 Secondary hyperparathyroidism of renal origin: Secondary | ICD-10-CM | POA: Diagnosis not present

## 2023-09-27 DIAGNOSIS — E8779 Other fluid overload: Secondary | ICD-10-CM | POA: Diagnosis not present

## 2023-09-27 DIAGNOSIS — R52 Pain, unspecified: Secondary | ICD-10-CM | POA: Diagnosis not present

## 2023-09-27 DIAGNOSIS — D689 Coagulation defect, unspecified: Secondary | ICD-10-CM | POA: Diagnosis not present

## 2023-09-28 ENCOUNTER — Ambulatory Visit: Payer: Self-pay

## 2023-09-28 ENCOUNTER — Other Ambulatory Visit: Payer: Self-pay | Admitting: Nurse Practitioner

## 2023-09-28 DIAGNOSIS — D61818 Other pancytopenia: Secondary | ICD-10-CM | POA: Diagnosis not present

## 2023-09-28 DIAGNOSIS — G894 Chronic pain syndrome: Secondary | ICD-10-CM | POA: Diagnosis not present

## 2023-09-28 DIAGNOSIS — I87312 Chronic venous hypertension (idiopathic) with ulcer of left lower extremity: Secondary | ICD-10-CM | POA: Diagnosis not present

## 2023-09-28 DIAGNOSIS — Z89611 Acquired absence of right leg above knee: Secondary | ICD-10-CM | POA: Diagnosis not present

## 2023-09-28 DIAGNOSIS — D631 Anemia in chronic kidney disease: Secondary | ICD-10-CM | POA: Diagnosis not present

## 2023-09-28 DIAGNOSIS — L97821 Non-pressure chronic ulcer of other part of left lower leg limited to breakdown of skin: Secondary | ICD-10-CM | POA: Diagnosis not present

## 2023-09-28 DIAGNOSIS — E1169 Type 2 diabetes mellitus with other specified complication: Secondary | ICD-10-CM | POA: Diagnosis not present

## 2023-09-28 DIAGNOSIS — I2510 Atherosclerotic heart disease of native coronary artery without angina pectoris: Secondary | ICD-10-CM | POA: Diagnosis not present

## 2023-09-28 DIAGNOSIS — E1122 Type 2 diabetes mellitus with diabetic chronic kidney disease: Secondary | ICD-10-CM | POA: Diagnosis not present

## 2023-09-28 DIAGNOSIS — E785 Hyperlipidemia, unspecified: Secondary | ICD-10-CM | POA: Diagnosis not present

## 2023-09-28 DIAGNOSIS — L97522 Non-pressure chronic ulcer of other part of left foot with fat layer exposed: Secondary | ICD-10-CM | POA: Diagnosis not present

## 2023-09-28 DIAGNOSIS — I5032 Chronic diastolic (congestive) heart failure: Secondary | ICD-10-CM | POA: Diagnosis not present

## 2023-09-28 DIAGNOSIS — E11621 Type 2 diabetes mellitus with foot ulcer: Secondary | ICD-10-CM | POA: Diagnosis not present

## 2023-09-28 DIAGNOSIS — I509 Heart failure, unspecified: Secondary | ICD-10-CM | POA: Diagnosis not present

## 2023-09-28 DIAGNOSIS — I482 Chronic atrial fibrillation, unspecified: Secondary | ICD-10-CM | POA: Diagnosis not present

## 2023-09-28 DIAGNOSIS — N186 End stage renal disease: Secondary | ICD-10-CM | POA: Diagnosis not present

## 2023-09-28 DIAGNOSIS — I700 Atherosclerosis of aorta: Secondary | ICD-10-CM | POA: Diagnosis not present

## 2023-09-28 DIAGNOSIS — Z556 Problems related to health literacy: Secondary | ICD-10-CM | POA: Diagnosis not present

## 2023-09-28 DIAGNOSIS — Z992 Dependence on renal dialysis: Secondary | ICD-10-CM | POA: Diagnosis not present

## 2023-09-28 DIAGNOSIS — I132 Hypertensive heart and chronic kidney disease with heart failure and with stage 5 chronic kidney disease, or end stage renal disease: Secondary | ICD-10-CM | POA: Diagnosis not present

## 2023-09-28 DIAGNOSIS — Z993 Dependence on wheelchair: Secondary | ICD-10-CM | POA: Diagnosis not present

## 2023-09-28 DIAGNOSIS — J690 Pneumonitis due to inhalation of food and vomit: Secondary | ICD-10-CM | POA: Diagnosis not present

## 2023-09-28 DIAGNOSIS — Z7901 Long term (current) use of anticoagulants: Secondary | ICD-10-CM | POA: Diagnosis not present

## 2023-09-28 MED ORDER — SILVER SULFADIAZINE 1 % EX CREA: 1.0000 | TOPICAL_CREAM | Freq: Every day | CUTANEOUS | 2 refills | Status: AC

## 2023-09-28 NOTE — Telephone Encounter (Signed)
 FYI Only or Action Required?: FYI only for provider.  Patient was last seen in primary care on 09/16/2023 by Cannady, Jolene T, NP.  Called Nurse Triage reporting No chief complaint on file..  Symptoms began yesterday.  Interventions attempted: Prescription medications: Oxycodo.  Symptoms are: gradually worsening.  Triage Disposition: See PCP When Office is Open (Within 3 Days)  Patient/caregiver understands and will follow disposition?: Yes   Copied from CRM [id number]. Topic: Clinical - Red Word Triage >> Sep 28, 2023  1:57 PM Antwanette L wrote: Red Word that prompted transfer to Nurse Triage: Adam Howell from [facility] is calling to report that the patient has swollen on both arms and legs. Patient is requesting  Hydrocodone   Out of the silvadene  cream.   Adam Howell Rushing Number [phone] Reason for Disposition  [1] MODERATE pain (e.g., interferes with normal activities) AND [2] present > 3 days  Answer Assessment - Initial Assessment Questions 1. ONSET: When did the muscle aches or body pains start?      Several days ago  2. LOCATION: What part of your body is hurting? (e.g., entire body, arms, legs)      Pain all over the body  3. SEVERITY: How bad is the pain? (Scale 1-10; or mild, moderate, severe)     Moderate to severe  4. CAUSE: What do you think is causing the pains?     Unsure, possibly r/t wounds  5. FEVER: Do you have a fever? If Yes, ask: What is your temperature, how was it measured, and  when did it start?      No  6. OTHER SYMPTOMS: Do you have any other symptoms? (e.g., chest pain, cold or flu symptoms, rash, weakness, weight loss)  Swelling, Weight gain.   8. TRAVEL: Have you traveled out of the country in the last month? (e.g., exposures, travel history)     No  Also Out of the silvadene  cream and looking for a new pain medication as the Oxy is not effective.   Spoke with Adam Howell with [facility] , Callback Number  [phone]  Protocols used: Muscle Aches and Body Pain-A-AH

## 2023-09-28 NOTE — Telephone Encounter (Signed)
 Copied from CRM [id number]. Topic: General - Other >> Sep 28, 2023  4:54 PM DeAngela L wrote: Reason for CRM: Lenward calling with [facility] to inform the office this is not a new request cause he already the pt is already in their system this is just a change of status for the patient  The form is 3051 was sent to office and returned to her on 09/24/23 she just received information the form was rejected and it needs to be corrected Please have the provider correct and complete Step 6 (Step 6 details describing medical change of status for the patient needs to be completed)  She refaxing the form now  Phone for any questions [phone]  fax [phone]

## 2023-09-29 DIAGNOSIS — N2581 Secondary hyperparathyroidism of renal origin: Secondary | ICD-10-CM | POA: Diagnosis not present

## 2023-09-29 DIAGNOSIS — Z992 Dependence on renal dialysis: Secondary | ICD-10-CM | POA: Diagnosis not present

## 2023-09-29 DIAGNOSIS — R52 Pain, unspecified: Secondary | ICD-10-CM | POA: Diagnosis not present

## 2023-09-29 DIAGNOSIS — E8779 Other fluid overload: Secondary | ICD-10-CM | POA: Diagnosis not present

## 2023-09-29 DIAGNOSIS — D509 Iron deficiency anemia, unspecified: Secondary | ICD-10-CM | POA: Diagnosis not present

## 2023-09-29 DIAGNOSIS — D689 Coagulation defect, unspecified: Secondary | ICD-10-CM | POA: Diagnosis not present

## 2023-09-29 DIAGNOSIS — N186 End stage renal disease: Secondary | ICD-10-CM | POA: Diagnosis not present

## 2023-09-29 NOTE — Telephone Encounter (Signed)
 Forms received. Given to provider to add information as requested.

## 2023-09-29 NOTE — Telephone Encounter (Unsigned)
 Copied from CRM [id number]. Topic: General - Other >> Sep 29, 2023 12:27 PM Tobias CROME wrote: Reason for CRM: Inocente with [facility] requesting call back. Inocente spoke to NT and informed someone would call her back, Inocente has not heard from anyone in office.   Best callback number:  [phone]

## 2023-09-29 NOTE — Telephone Encounter (Signed)
 Called and notified Adam Howell of Jolene's message.

## 2023-09-29 NOTE — Telephone Encounter (Signed)
 Form has been corrected and faxed back to [facility].

## 2023-09-30 ENCOUNTER — Telehealth: Payer: Self-pay

## 2023-09-30 ENCOUNTER — Ambulatory Visit: Admitting: Infectious Diseases

## 2023-09-30 ENCOUNTER — Telehealth: Payer: Self-pay | Admitting: Nurse Practitioner

## 2023-09-30 ENCOUNTER — Ambulatory Visit: Payer: Self-pay

## 2023-09-30 DIAGNOSIS — Z556 Problems related to health literacy: Secondary | ICD-10-CM | POA: Diagnosis not present

## 2023-09-30 DIAGNOSIS — D631 Anemia in chronic kidney disease: Secondary | ICD-10-CM | POA: Diagnosis not present

## 2023-09-30 DIAGNOSIS — L97821 Non-pressure chronic ulcer of other part of left lower leg limited to breakdown of skin: Secondary | ICD-10-CM | POA: Diagnosis not present

## 2023-09-30 DIAGNOSIS — Z89611 Acquired absence of right leg above knee: Secondary | ICD-10-CM | POA: Diagnosis not present

## 2023-09-30 DIAGNOSIS — G894 Chronic pain syndrome: Secondary | ICD-10-CM | POA: Diagnosis not present

## 2023-09-30 DIAGNOSIS — I2510 Atherosclerotic heart disease of native coronary artery without angina pectoris: Secondary | ICD-10-CM | POA: Diagnosis not present

## 2023-09-30 DIAGNOSIS — Z993 Dependence on wheelchair: Secondary | ICD-10-CM | POA: Diagnosis not present

## 2023-09-30 DIAGNOSIS — I700 Atherosclerosis of aorta: Secondary | ICD-10-CM | POA: Diagnosis not present

## 2023-09-30 DIAGNOSIS — J690 Pneumonitis due to inhalation of food and vomit: Secondary | ICD-10-CM | POA: Diagnosis not present

## 2023-09-30 DIAGNOSIS — L97529 Non-pressure chronic ulcer of other part of left foot with unspecified severity: Secondary | ICD-10-CM | POA: Diagnosis not present

## 2023-09-30 DIAGNOSIS — D61818 Other pancytopenia: Secondary | ICD-10-CM | POA: Diagnosis not present

## 2023-09-30 DIAGNOSIS — Z992 Dependence on renal dialysis: Secondary | ICD-10-CM | POA: Diagnosis not present

## 2023-09-30 DIAGNOSIS — I87312 Chronic venous hypertension (idiopathic) with ulcer of left lower extremity: Secondary | ICD-10-CM | POA: Diagnosis not present

## 2023-09-30 DIAGNOSIS — I132 Hypertensive heart and chronic kidney disease with heart failure and with stage 5 chronic kidney disease, or end stage renal disease: Secondary | ICD-10-CM | POA: Diagnosis not present

## 2023-09-30 DIAGNOSIS — N186 End stage renal disease: Secondary | ICD-10-CM | POA: Diagnosis not present

## 2023-09-30 DIAGNOSIS — L97829 Non-pressure chronic ulcer of other part of left lower leg with unspecified severity: Secondary | ICD-10-CM | POA: Diagnosis not present

## 2023-09-30 DIAGNOSIS — I5032 Chronic diastolic (congestive) heart failure: Secondary | ICD-10-CM | POA: Diagnosis not present

## 2023-09-30 DIAGNOSIS — I509 Heart failure, unspecified: Secondary | ICD-10-CM | POA: Diagnosis not present

## 2023-09-30 DIAGNOSIS — E11621 Type 2 diabetes mellitus with foot ulcer: Secondary | ICD-10-CM | POA: Diagnosis not present

## 2023-09-30 DIAGNOSIS — E1122 Type 2 diabetes mellitus with diabetic chronic kidney disease: Secondary | ICD-10-CM | POA: Diagnosis not present

## 2023-09-30 DIAGNOSIS — I482 Chronic atrial fibrillation, unspecified: Secondary | ICD-10-CM | POA: Diagnosis not present

## 2023-09-30 DIAGNOSIS — E1169 Type 2 diabetes mellitus with other specified complication: Secondary | ICD-10-CM | POA: Diagnosis not present

## 2023-09-30 DIAGNOSIS — L97522 Non-pressure chronic ulcer of other part of left foot with fat layer exposed: Secondary | ICD-10-CM | POA: Diagnosis not present

## 2023-09-30 DIAGNOSIS — Z7901 Long term (current) use of anticoagulants: Secondary | ICD-10-CM | POA: Diagnosis not present

## 2023-09-30 DIAGNOSIS — E785 Hyperlipidemia, unspecified: Secondary | ICD-10-CM | POA: Diagnosis not present

## 2023-09-30 NOTE — Telephone Encounter (Signed)
 Please call and schedule the patient an appointment for evaluation.

## 2023-09-30 NOTE — Telephone Encounter (Signed)
 Appointment has been made

## 2023-09-30 NOTE — Telephone Encounter (Signed)
 Copied from CRM [id number]. Topic: Clinical - Order For Equipment >> Sep 30, 2023 12:03 PM Berwyn MATSU wrote: Reason for CRM:  patient called in requesting an order for a walker.   May you please assist.

## 2023-09-30 NOTE — Telephone Encounter (Signed)
 FYI Only or Action Required?: Action required by provider: request for appointment.  Patient was last seen in primary care on 09/16/2023 by Cannady, Jolene T, NP.  Called Nurse Triage reporting Edema.  Symptoms began several weeks ago.  Interventions attempted: Nothing.  Symptoms are: gradually worsening.  Triage Disposition: See PCP When Office is Open (Within 3 Days)  Patient/caregiver understands and will follow disposition?: Yes, will follow disposition  Copied from CRM [id number]. Topic: Clinical - Red Word Triage >> Sep 30, 2023 12:05 PM Berwyn MATSU wrote: Red Word that prompted transfer to Nurse Triage: pain and swelling in feet and hands. Reason for Disposition  [1] MILD swelling of both ankles (i.e., pedal edema) AND [2] new-onset or getting worse  Answer Assessment - Initial Assessment Questions 1. ONSET: When did the swelling start? (e.g., minutes, hours, days)     weeks 2. LOCATION: What part of the leg is swollen?  Are both legs swollen or just one leg?     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 SEVERITY: How bad is the swelling? (e.g., localized; mild, moderate, severe)     7 4. REDNESS: Is there redness or signs of infection?     denies 5. PAIN: Is the swelling painful to touch? If Yes, ask: How painful is it?   (Scale 1-10; mild, moderate or severe)     7 6. FEVER: Do you have a fever? If Yes, ask: What is it, how was it measured, and when did it start?      denies 7. CAUSE: What do you think is causing the leg swelling?     unsure 8. MEDICAL HISTORY: Do you have a history of blood clots (e.g., DVT), cancer, heart failure, kidney disease, or liver failure?     CHF  10. OTHER SYMPTOMS: Do you have any other symptoms? (e.g., chest pain, difficulty breathing)       denies  Protocols used: Leg Swelling and Edema-A-AH

## 2023-09-30 NOTE — Telephone Encounter (Signed)
 Yes. May need to have Adam Howell addend note from 7/3- but go ahead and place it

## 2023-09-30 NOTE — Telephone Encounter (Signed)
 Copied from CRM [id number]. Topic: Clinical - Medication Question >> Sep 30, 2023 12:08 PM Adam Howell wrote: Reason for CRM:  patient is requesting to change from oxycodone  to hydrocodone  as it is not working for him.  He is also requesting a refill for the Amiben but it was just filled on 09/16/23.   May you please assist,

## 2023-09-30 NOTE — Telephone Encounter (Signed)
 Can we order a walker for the patient through [employer]?

## 2023-09-30 NOTE — Telephone Encounter (Signed)
 Vannie was ordered on [employer] 09/16/23. Per insurance, they will not cover a walking aid at this time because the patient received a new walker in 2023 and a wheelchair in 2024. Will call and notify patient of this.

## 2023-10-01 DIAGNOSIS — D509 Iron deficiency anemia, unspecified: Secondary | ICD-10-CM | POA: Diagnosis not present

## 2023-10-01 DIAGNOSIS — N2581 Secondary hyperparathyroidism of renal origin: Secondary | ICD-10-CM | POA: Diagnosis not present

## 2023-10-01 DIAGNOSIS — D689 Coagulation defect, unspecified: Secondary | ICD-10-CM | POA: Diagnosis not present

## 2023-10-01 DIAGNOSIS — N186 End stage renal disease: Secondary | ICD-10-CM | POA: Diagnosis not present

## 2023-10-01 DIAGNOSIS — R52 Pain, unspecified: Secondary | ICD-10-CM | POA: Diagnosis not present

## 2023-10-01 DIAGNOSIS — E8779 Other fluid overload: Secondary | ICD-10-CM | POA: Diagnosis not present

## 2023-10-01 DIAGNOSIS — Z992 Dependence on renal dialysis: Secondary | ICD-10-CM | POA: Diagnosis not present

## 2023-10-02 DIAGNOSIS — Z556 Problems related to health literacy: Secondary | ICD-10-CM | POA: Diagnosis not present

## 2023-10-02 DIAGNOSIS — E1122 Type 2 diabetes mellitus with diabetic chronic kidney disease: Secondary | ICD-10-CM | POA: Diagnosis not present

## 2023-10-02 DIAGNOSIS — I482 Chronic atrial fibrillation, unspecified: Secondary | ICD-10-CM | POA: Diagnosis not present

## 2023-10-02 DIAGNOSIS — Z7901 Long term (current) use of anticoagulants: Secondary | ICD-10-CM | POA: Diagnosis not present

## 2023-10-02 DIAGNOSIS — D631 Anemia in chronic kidney disease: Secondary | ICD-10-CM | POA: Diagnosis not present

## 2023-10-02 DIAGNOSIS — L97821 Non-pressure chronic ulcer of other part of left lower leg limited to breakdown of skin: Secondary | ICD-10-CM | POA: Diagnosis not present

## 2023-10-02 DIAGNOSIS — I509 Heart failure, unspecified: Secondary | ICD-10-CM | POA: Diagnosis not present

## 2023-10-02 DIAGNOSIS — N186 End stage renal disease: Secondary | ICD-10-CM | POA: Diagnosis not present

## 2023-10-02 DIAGNOSIS — I87312 Chronic venous hypertension (idiopathic) with ulcer of left lower extremity: Secondary | ICD-10-CM | POA: Diagnosis not present

## 2023-10-02 DIAGNOSIS — Z993 Dependence on wheelchair: Secondary | ICD-10-CM | POA: Diagnosis not present

## 2023-10-02 DIAGNOSIS — I2510 Atherosclerotic heart disease of native coronary artery without angina pectoris: Secondary | ICD-10-CM | POA: Diagnosis not present

## 2023-10-02 DIAGNOSIS — E11621 Type 2 diabetes mellitus with foot ulcer: Secondary | ICD-10-CM | POA: Diagnosis not present

## 2023-10-02 DIAGNOSIS — I700 Atherosclerosis of aorta: Secondary | ICD-10-CM | POA: Diagnosis not present

## 2023-10-02 DIAGNOSIS — J690 Pneumonitis due to inhalation of food and vomit: Secondary | ICD-10-CM | POA: Diagnosis not present

## 2023-10-02 DIAGNOSIS — E785 Hyperlipidemia, unspecified: Secondary | ICD-10-CM | POA: Diagnosis not present

## 2023-10-02 DIAGNOSIS — E1169 Type 2 diabetes mellitus with other specified complication: Secondary | ICD-10-CM | POA: Diagnosis not present

## 2023-10-02 DIAGNOSIS — Z89611 Acquired absence of right leg above knee: Secondary | ICD-10-CM | POA: Diagnosis not present

## 2023-10-02 DIAGNOSIS — Z992 Dependence on renal dialysis: Secondary | ICD-10-CM | POA: Diagnosis not present

## 2023-10-02 DIAGNOSIS — I5022 Chronic systolic (congestive) heart failure: Secondary | ICD-10-CM | POA: Diagnosis not present

## 2023-10-02 DIAGNOSIS — D61818 Other pancytopenia: Secondary | ICD-10-CM | POA: Diagnosis not present

## 2023-10-02 DIAGNOSIS — L97522 Non-pressure chronic ulcer of other part of left foot with fat layer exposed: Secondary | ICD-10-CM | POA: Diagnosis not present

## 2023-10-02 DIAGNOSIS — G894 Chronic pain syndrome: Secondary | ICD-10-CM | POA: Diagnosis not present

## 2023-10-02 DIAGNOSIS — I5032 Chronic diastolic (congestive) heart failure: Secondary | ICD-10-CM | POA: Diagnosis not present

## 2023-10-02 DIAGNOSIS — I132 Hypertensive heart and chronic kidney disease with heart failure and with stage 5 chronic kidney disease, or end stage renal disease: Secondary | ICD-10-CM | POA: Diagnosis not present

## 2023-10-04 DIAGNOSIS — D509 Iron deficiency anemia, unspecified: Secondary | ICD-10-CM | POA: Diagnosis not present

## 2023-10-04 DIAGNOSIS — Z992 Dependence on renal dialysis: Secondary | ICD-10-CM | POA: Diagnosis not present

## 2023-10-04 DIAGNOSIS — N186 End stage renal disease: Secondary | ICD-10-CM | POA: Diagnosis not present

## 2023-10-04 DIAGNOSIS — E8779 Other fluid overload: Secondary | ICD-10-CM | POA: Diagnosis not present

## 2023-10-04 DIAGNOSIS — N2581 Secondary hyperparathyroidism of renal origin: Secondary | ICD-10-CM | POA: Diagnosis not present

## 2023-10-04 DIAGNOSIS — R52 Pain, unspecified: Secondary | ICD-10-CM | POA: Diagnosis not present

## 2023-10-04 DIAGNOSIS — D689 Coagulation defect, unspecified: Secondary | ICD-10-CM | POA: Diagnosis not present

## 2023-10-05 DIAGNOSIS — E785 Hyperlipidemia, unspecified: Secondary | ICD-10-CM | POA: Diagnosis not present

## 2023-10-05 DIAGNOSIS — I132 Hypertensive heart and chronic kidney disease with heart failure and with stage 5 chronic kidney disease, or end stage renal disease: Secondary | ICD-10-CM | POA: Diagnosis not present

## 2023-10-05 DIAGNOSIS — Z992 Dependence on renal dialysis: Secondary | ICD-10-CM | POA: Diagnosis not present

## 2023-10-05 DIAGNOSIS — L97821 Non-pressure chronic ulcer of other part of left lower leg limited to breakdown of skin: Secondary | ICD-10-CM | POA: Diagnosis not present

## 2023-10-05 DIAGNOSIS — Z7901 Long term (current) use of anticoagulants: Secondary | ICD-10-CM | POA: Diagnosis not present

## 2023-10-05 DIAGNOSIS — I2510 Atherosclerotic heart disease of native coronary artery without angina pectoris: Secondary | ICD-10-CM | POA: Diagnosis not present

## 2023-10-05 DIAGNOSIS — E11621 Type 2 diabetes mellitus with foot ulcer: Secondary | ICD-10-CM | POA: Diagnosis not present

## 2023-10-05 DIAGNOSIS — I87312 Chronic venous hypertension (idiopathic) with ulcer of left lower extremity: Secondary | ICD-10-CM | POA: Diagnosis not present

## 2023-10-05 DIAGNOSIS — D61818 Other pancytopenia: Secondary | ICD-10-CM | POA: Diagnosis not present

## 2023-10-05 DIAGNOSIS — J690 Pneumonitis due to inhalation of food and vomit: Secondary | ICD-10-CM | POA: Diagnosis not present

## 2023-10-05 DIAGNOSIS — E1169 Type 2 diabetes mellitus with other specified complication: Secondary | ICD-10-CM | POA: Diagnosis not present

## 2023-10-05 DIAGNOSIS — I5032 Chronic diastolic (congestive) heart failure: Secondary | ICD-10-CM | POA: Diagnosis not present

## 2023-10-05 DIAGNOSIS — L97522 Non-pressure chronic ulcer of other part of left foot with fat layer exposed: Secondary | ICD-10-CM | POA: Diagnosis not present

## 2023-10-05 DIAGNOSIS — I482 Chronic atrial fibrillation, unspecified: Secondary | ICD-10-CM | POA: Diagnosis not present

## 2023-10-05 DIAGNOSIS — I700 Atherosclerosis of aorta: Secondary | ICD-10-CM | POA: Diagnosis not present

## 2023-10-05 DIAGNOSIS — D631 Anemia in chronic kidney disease: Secondary | ICD-10-CM | POA: Diagnosis not present

## 2023-10-05 DIAGNOSIS — N186 End stage renal disease: Secondary | ICD-10-CM | POA: Diagnosis not present

## 2023-10-05 DIAGNOSIS — G894 Chronic pain syndrome: Secondary | ICD-10-CM | POA: Diagnosis not present

## 2023-10-05 DIAGNOSIS — Z89611 Acquired absence of right leg above knee: Secondary | ICD-10-CM | POA: Diagnosis not present

## 2023-10-05 DIAGNOSIS — Z993 Dependence on wheelchair: Secondary | ICD-10-CM | POA: Diagnosis not present

## 2023-10-05 DIAGNOSIS — I509 Heart failure, unspecified: Secondary | ICD-10-CM | POA: Diagnosis not present

## 2023-10-05 DIAGNOSIS — E1122 Type 2 diabetes mellitus with diabetic chronic kidney disease: Secondary | ICD-10-CM | POA: Diagnosis not present

## 2023-10-05 DIAGNOSIS — Z556 Problems related to health literacy: Secondary | ICD-10-CM | POA: Diagnosis not present

## 2023-10-06 ENCOUNTER — Telehealth: Payer: Self-pay

## 2023-10-06 DIAGNOSIS — Z992 Dependence on renal dialysis: Secondary | ICD-10-CM | POA: Diagnosis not present

## 2023-10-06 DIAGNOSIS — E1122 Type 2 diabetes mellitus with diabetic chronic kidney disease: Secondary | ICD-10-CM | POA: Diagnosis not present

## 2023-10-06 DIAGNOSIS — E11621 Type 2 diabetes mellitus with foot ulcer: Secondary | ICD-10-CM | POA: Diagnosis not present

## 2023-10-06 DIAGNOSIS — Z993 Dependence on wheelchair: Secondary | ICD-10-CM | POA: Diagnosis not present

## 2023-10-06 DIAGNOSIS — D61818 Other pancytopenia: Secondary | ICD-10-CM | POA: Diagnosis not present

## 2023-10-06 DIAGNOSIS — I87312 Chronic venous hypertension (idiopathic) with ulcer of left lower extremity: Secondary | ICD-10-CM | POA: Diagnosis not present

## 2023-10-06 DIAGNOSIS — J690 Pneumonitis due to inhalation of food and vomit: Secondary | ICD-10-CM | POA: Diagnosis not present

## 2023-10-06 DIAGNOSIS — N2581 Secondary hyperparathyroidism of renal origin: Secondary | ICD-10-CM | POA: Diagnosis not present

## 2023-10-06 DIAGNOSIS — D509 Iron deficiency anemia, unspecified: Secondary | ICD-10-CM | POA: Diagnosis not present

## 2023-10-06 DIAGNOSIS — I482 Chronic atrial fibrillation, unspecified: Secondary | ICD-10-CM | POA: Diagnosis not present

## 2023-10-06 DIAGNOSIS — Z89611 Acquired absence of right leg above knee: Secondary | ICD-10-CM | POA: Diagnosis not present

## 2023-10-06 DIAGNOSIS — D631 Anemia in chronic kidney disease: Secondary | ICD-10-CM | POA: Diagnosis not present

## 2023-10-06 DIAGNOSIS — I132 Hypertensive heart and chronic kidney disease with heart failure and with stage 5 chronic kidney disease, or end stage renal disease: Secondary | ICD-10-CM | POA: Diagnosis not present

## 2023-10-06 DIAGNOSIS — E785 Hyperlipidemia, unspecified: Secondary | ICD-10-CM | POA: Diagnosis not present

## 2023-10-06 DIAGNOSIS — Z7901 Long term (current) use of anticoagulants: Secondary | ICD-10-CM | POA: Diagnosis not present

## 2023-10-06 DIAGNOSIS — R52 Pain, unspecified: Secondary | ICD-10-CM | POA: Diagnosis not present

## 2023-10-06 DIAGNOSIS — I2510 Atherosclerotic heart disease of native coronary artery without angina pectoris: Secondary | ICD-10-CM | POA: Diagnosis not present

## 2023-10-06 DIAGNOSIS — N186 End stage renal disease: Secondary | ICD-10-CM | POA: Diagnosis not present

## 2023-10-06 DIAGNOSIS — I700 Atherosclerosis of aorta: Secondary | ICD-10-CM | POA: Diagnosis not present

## 2023-10-06 DIAGNOSIS — D689 Coagulation defect, unspecified: Secondary | ICD-10-CM | POA: Diagnosis not present

## 2023-10-06 DIAGNOSIS — E8779 Other fluid overload: Secondary | ICD-10-CM | POA: Diagnosis not present

## 2023-10-06 DIAGNOSIS — I5032 Chronic diastolic (congestive) heart failure: Secondary | ICD-10-CM | POA: Diagnosis not present

## 2023-10-06 DIAGNOSIS — L97522 Non-pressure chronic ulcer of other part of left foot with fat layer exposed: Secondary | ICD-10-CM | POA: Diagnosis not present

## 2023-10-06 DIAGNOSIS — L97821 Non-pressure chronic ulcer of other part of left lower leg limited to breakdown of skin: Secondary | ICD-10-CM | POA: Diagnosis not present

## 2023-10-06 DIAGNOSIS — E1169 Type 2 diabetes mellitus with other specified complication: Secondary | ICD-10-CM | POA: Diagnosis not present

## 2023-10-06 DIAGNOSIS — Z556 Problems related to health literacy: Secondary | ICD-10-CM | POA: Diagnosis not present

## 2023-10-06 DIAGNOSIS — G894 Chronic pain syndrome: Secondary | ICD-10-CM | POA: Diagnosis not present

## 2023-10-06 DIAGNOSIS — I509 Heart failure, unspecified: Secondary | ICD-10-CM | POA: Diagnosis not present

## 2023-10-06 NOTE — Telephone Encounter (Signed)
 Copied from CRM #[id number]. Topic: Clinical - Order For Equipment >> Oct 05, 2023  5:26 PM Delon DASEN wrote: Reason for CRM: Patient calling about walker order, his is broken and needs it replaced- please call [phone]

## 2023-10-06 NOTE — Telephone Encounter (Signed)
 See previous phone encounter. Patient's insurance will not cover a new walker at this time due to recently paying for a walker for the patient.   Tried calling patient to notify him of the above, no answer and VM full. Will try to call again.   OK for E2C2 to let patient know of the above if he calls back.

## 2023-10-07 ENCOUNTER — Other Ambulatory Visit
Admission: RE | Admit: 2023-10-07 | Discharge: 2023-10-07 | Disposition: A | Discharge status: Home / Self Care | Source: Ambulatory Visit | Attending: Infectious Diseases | Admitting: Infectious Diseases

## 2023-10-07 ENCOUNTER — Ambulatory Visit: Admitting: Nurse Practitioner

## 2023-10-07 ENCOUNTER — Ambulatory Visit: Attending: Infectious Diseases | Admitting: Infectious Diseases

## 2023-10-07 VITALS — BP 149/82 | HR 62 | Temp 97.9°F

## 2023-10-07 DIAGNOSIS — D631 Anemia in chronic kidney disease: Secondary | ICD-10-CM | POA: Diagnosis not present

## 2023-10-07 DIAGNOSIS — Z992 Dependence on renal dialysis: Secondary | ICD-10-CM | POA: Diagnosis not present

## 2023-10-07 DIAGNOSIS — B9562 Methicillin resistant Staphylococcus aureus infection as the cause of diseases classified elsewhere: Secondary | ICD-10-CM | POA: Insufficient documentation

## 2023-10-07 DIAGNOSIS — I2510 Atherosclerotic heart disease of native coronary artery without angina pectoris: Secondary | ICD-10-CM | POA: Insufficient documentation

## 2023-10-07 DIAGNOSIS — I509 Heart failure, unspecified: Secondary | ICD-10-CM | POA: Diagnosis not present

## 2023-10-07 DIAGNOSIS — Z89611 Acquired absence of right leg above knee: Secondary | ICD-10-CM | POA: Diagnosis not present

## 2023-10-07 DIAGNOSIS — L03116 Cellulitis of left lower limb: Secondary | ICD-10-CM | POA: Diagnosis not present

## 2023-10-07 DIAGNOSIS — I132 Hypertensive heart and chronic kidney disease with heart failure and with stage 5 chronic kidney disease, or end stage renal disease: Secondary | ICD-10-CM | POA: Insufficient documentation

## 2023-10-07 DIAGNOSIS — N186 End stage renal disease: Secondary | ICD-10-CM | POA: Insufficient documentation

## 2023-10-07 DIAGNOSIS — E1122 Type 2 diabetes mellitus with diabetic chronic kidney disease: Secondary | ICD-10-CM | POA: Diagnosis not present

## 2023-10-07 DIAGNOSIS — Z79899 Other long term (current) drug therapy: Secondary | ICD-10-CM | POA: Insufficient documentation

## 2023-10-07 DIAGNOSIS — R7881 Bacteremia: Secondary | ICD-10-CM | POA: Diagnosis not present

## 2023-10-07 DIAGNOSIS — E11628 Type 2 diabetes mellitus with other skin complications: Secondary | ICD-10-CM | POA: Diagnosis not present

## 2023-10-07 DIAGNOSIS — G894 Chronic pain syndrome: Secondary | ICD-10-CM | POA: Insufficient documentation

## 2023-10-07 LAB — CBC WITH DIFFERENTIAL/PLATELET
Abs Immature Granulocytes: 0.02 K/uL (ref 0.00–0.07)
Basophils Absolute: 0.1 K/uL (ref 0.0–0.1)
Basophils Relative: 1 %
Eosinophils Absolute: 0.3 K/uL (ref 0.0–0.5)
Eosinophils Relative: 7 %
HCT: 27.9 % — ABNORMAL LOW (ref 39.0–52.0)
Hemoglobin: 8.4 g/dL — ABNORMAL LOW (ref 13.0–17.0)
Immature Granulocytes: 0 %
Lymphocytes Relative: 16 %
Lymphs Abs: 0.8 K/uL (ref 0.7–4.0)
MCH: 25.8 pg — ABNORMAL LOW (ref 26.0–34.0)
MCHC: 30.1 g/dL (ref 30.0–36.0)
MCV: 85.6 fL (ref 80.0–100.0)
Monocytes Absolute: 0.3 K/uL (ref 0.1–1.0)
Monocytes Relative: 7 %
Neutro Abs: 3.4 K/uL (ref 1.7–7.7)
Neutrophils Relative %: 69 %
Platelets: 246 K/uL (ref 150–400)
RBC: 3.26 MIL/uL — ABNORMAL LOW (ref 4.22–5.81)
RDW: 17.5 % — ABNORMAL HIGH (ref 11.5–15.5)
WBC: 4.9 K/uL (ref 4.0–10.5)
nRBC: 0 % (ref 0.0–0.2)

## 2023-10-07 NOTE — Progress Notes (Signed)
 NAME: Adam Howell  DOB: [birth year]  MRN: [id number]  Date/Time: 10/07/2023 11:00 AM   Subjective:   ? Adam Howell is a 63 y.o. with a  history of ESRD on dialysis, Anemia, DM, Chronic pain syndrome, Rt AKA, HTN CAD,   presents for follow-up after completing antibiotic treatment for a recent MRSA bloodstream infection.  He was hospitalized from June 15th to June 24th, 2025, for a MRSA bloodstream infection. During this hospitalization, he was also found to have pneumonia and was treated for it. His dialysis catheter was removed during his hospital stay. A transesophageal echocardiogram (TEE) was performed to rule out endocarditis, and follow-up blood cultures were negative. He completed a six-week course of antibiotics, finishing on July 16th, 2025. He underwent a revision of his dialysis fistula on May 22nd, 2025,  However, the fistula was later removed due to swelling and clot formation and degeneration on 09/01/23 and the surgical culture of the the tissue was MRSA . Surgical History: - Fistula revision (2021-08-04): Revision of the fistula due to swelling and knot formation in the arm - Fistula removal (2021-08-31): Removal of infected fistula with MRSA and clot in the left arm - Catheter placement (2021-09-05): Placement of a new dialysis catheter   He is doing well now.He reports no current cough, shortness of breath, fever, or chills.   He has a history of diabetes and a chronic leg wound, which is still present and being treated with regular bandage changes. He follows up with PCP for his diabetes management.  He attends dialysis sessions on Mondays, Wednesdays, and Fridays, and is transported by a [employer].    Past Medical History:  Diagnosis Date   Anemia in chronic kidney disease    Cellulitis of left lower extremity    Chronic atrial fibrillation (HCC)    Chronic kidney disease    Congestive heart failure (HCC)    Difficult intubation    End-stage renal  disease on hemodialysis (HCC)    Hyperlipidemia    Hypertension    Type 2 diabetes mellitus with complication [facility])     Past Surgical History:  Procedure Laterality Date   A/V FISTULAGRAM Left 08/27/2022   Procedure: A/V Fistulagram;  Surgeon: Marea Selinda RAMAN, MD;  Location: [facility] INVASIVE CV LAB;  Service: Cardiovascular;  Laterality: Left;   A/V FISTULAGRAM Left 08/12/2023   Procedure: A/V Fistulagram;  Surgeon: Marea Selinda RAMAN, MD;  Location: [facility] INVASIVE CV LAB;  Service: Cardiovascular;  Laterality: Left;   AMPUTATION Right 03/13/2022   Procedure: AMPUTATION ABOVE KNEE;  Surgeon: Marea Selinda RAMAN, MD;  Location: [facility] ORS;  Service: General;  Laterality: Right;   APPLICATION OF WOUND VAC Right 03/07/2022   Procedure: APPLICATION OF WOUND VAC;  Surgeon: Tisa Curry LABOR, MD;  Location: [facility] ORS;  Service: Vascular;  Laterality: Right;  GAAC [id number]   CENTRAL LINE INSERTION Left 08/05/2023   Procedure: CENTRAL LINE INSERTION;  Surgeon: Marea Selinda RAMAN, MD;  Location: [facility] ORS;  Service: Vascular;  Laterality: Left;   DIALYSIS/PERMA CATHETER INSERTION Right 08/05/2023   Procedure: DIALYSIS/PERMA CATHETER INSERTION;  Surgeon: Marea Selinda RAMAN, MD;  Location: [facility] ORS;  Service: Vascular;  Laterality: Right;   DIALYSIS/PERMA CATHETER INSERTION N/A 09/06/2023   Procedure: DIALYSIS/PERMA CATHETER INSERTION;  Surgeon: Marea Selinda RAMAN, MD;  Location: [facility] INV[facility] CV LAB;  Service: Cardiovascular;  Laterality: N/A;   DIALYSIS/PERMA CATHETER REMOVAL Right 08/31/2023   Procedure: DIALYSIS/PERMA CATHETER REMOVAL;  Surgeon: Jama Cordella MATSU, MD;  Location: [facility] INVASIVE CV  LAB;  Service: Cardiovascular;  Laterality: Right;   GROIN DISSECTION Right 08/05/2023   Procedure: right groin venogram, angioplasty of superior venacava and anomic vein;  Surgeon: Marea Selinda RAMAN, MD;  Location: [facility] ORS;  Service: Vascular;  Laterality: Right;   LOWER EXTREMITY ANGIOGRAPHY Left 02/15/2023   Procedure: Lower Extremity Angiography;  Surgeon:  Marea Selinda RAMAN, MD;  Location: [facility] INVASIVE CV LAB;  Service: Cardiovascular;  Laterality: Left;   REVISON OF ARTERIOVENOUS FISTULA Left 08/05/2023   Procedure: REVISON OF ARTERIOVENOUS FISTULA;  Surgeon: Marea Selinda RAMAN, MD;  Location: [facility] ORS;  Service: Vascular;  Laterality: Left;  JUMP GRAFT   REVISON OF ARTERIOVENOUS FISTULA Left 09/01/2023   Procedure: REVISON OF ARTERIOVENOUS FISTULA; INSERTION OF TEMPORARY HEMODIALYSIS CATHETER IN LEFT GROIN;  Surgeon: Marea Selinda RAMAN, MD;  Location: [facility] ORS;  Service: Vascular;  Laterality: Left;  excision of AV fistula graft   TEE WITHOUT CARDIOVERSION N/A 09/03/2023   Procedure: ECHOCARDIOGRAM, TRANSESOPHAGEAL;  Surgeon: Perla Evalene PARAS, MD;  Location: [facility] ORS;  Service: Cardiovascular;  Laterality: N/A;   WOUND DEBRIDEMENT Left    WOUND DEBRIDEMENT Right 01/16/2022   Procedure: DEBRIDEMENT WOUND;  Surgeon: Jama Cordella MATSU, MD;  Location: [facility] ORS;  Service: Vascular;  Laterality: Right;  Wound vac placement   WOUND DEBRIDEMENT Right 03/07/2022   Procedure: DEBRIDEMENT WOUND RIGHT LOWER EXTREMITY;  Surgeon: Tisa Curry LABOR, MD;  Location: [facility] ORS;  Service: Vascular;  Laterality: Right;    Social History   Socioeconomic History   Marital status: Divorced    Spouse name: Krosby Ritchie   Number of children: 4   Years of education: Not on file   Highest education level: Not on file  Occupational History   Not on file  Tobacco Use   Smoking status: Never   Smokeless tobacco: Never  Vaping Use   Vaping status: Never Used  Substance and Sexual Activity   Alcohol use: Not Currently   Drug use: Never   Sexual activity: Not Currently  Other Topics Concern   Not on file  Social History Narrative   Lives with daughter Rosaline.     Social Drivers of Corporate investment banker Strain: Low Risk  (11/25/2021)   Overall Financial Resource Strain (CARDIA)    Difficulty of Paying Living Expenses: Not hard at all  Food Insecurity: No Food  Insecurity (08/31/2023)   Hunger Vital Sign    Worried About Running Out of Food in the Last Year: Never true    Ran Out of Food in the Last Year: Never true  Transportation Needs: No Transportation Needs (08/31/2023)   PRAPARE - Administrator, Civil Service (Medical): No    Lack of Transportation (Non-Medical): No  Physical Activity: Insufficiently Active (11/25/2021)   Exercise Vital Sign    Days of Exercise per Week: 2 days    Minutes of Exercise per Session: 60 min  Stress: No Stress Concern Present (11/25/2021)   [employer] of Occupational Health - Occupational Stress Questionnaire    Feeling of Stress : Not at all  Social Connections: Moderately Isolated (12/29/2022)   Social Connection and Isolation Panel    Frequency of Communication with Friends and Family: More than three times a week    Frequency of Social Gatherings with Friends and Family: Three times a week    Attends Religious Services: 1 to 4 times per year    Active Member of Clubs or Organizations: No    Attends Banker Meetings:  Never    Marital Status: Never married  Intimate Partner Violence: Not At Risk (08/31/2023)   Humiliation, Afraid, Rape, and Kick questionnaire    Fear of Current or Ex-Partner: No    Emotionally Abused: No    Physically Abused: No    Sexually Abused: No    Family History  Problem Relation Age of Onset   Heart failure Mother    Cirrhosis Father    Hypertension Daughter    Allergies  Allergen Reactions   Ivp Dye [Iodinated Contrast Media] Anaphylaxis   Tramadol  Rash   I? Current Outpatient Medications  Medication Sig Dispense Refill   acetaminophen  (TYLENOL ) 325 MG tablet Take 2 tablets (650 mg total) by mouth every 6 (six) hours as needed for mild pain (pain score 1-3), fever or headache (or Fever >/= 101).     amiodarone  (PACERONE ) 200 MG tablet Take 1 tablet (200 mg total) by mouth 2 (two) times daily. 180 tablet 4   aspirin  EC 81 MG tablet Take 1  tablet (81 mg total) by mouth daily. Swallow whole.     carvedilol  (COREG ) 6.25 MG tablet Take 1 tablet (6.25 mg total) by mouth daily. 90 tablet 2   ELIQUIS  2.5 MG TABS tablet Take 1 tablet (2.5 mg total) by mouth 2 (two) times daily. 180 tablet 1   iron  polysaccharides (NIFEREX) 150 MG capsule Take 1 capsule (150 mg total) by mouth daily.     midodrine  (PROAMATINE ) 5 MG tablet Take 5 mg by mouth as needed (post dialysis hypotension SBP<90 or DBP<55).     ondansetron  (ZOFRAN ) 4 MG tablet Take 4 mg by mouth every 6 (six) hours as needed for nausea or vomiting.     rosuvastatin  (CRESTOR ) 40 MG tablet Take 1 tablet (40 mg total) by mouth daily. 90 tablet 1   sevelamer  carbonate (RENVELA ) 800 MG tablet Take 2,400 mg by mouth 3 (three) times daily.     silver  sulfADIAZINE  (SILVADENE ) 1 % cream Apply 1 Application topically daily. 50 g 2   zolpidem  (AMBIEN ) 10 MG tablet Take 1 tablet (10 mg total) by mouth at bedtime as needed for sleep. 30 tablet 1   oxyCODONE -acetaminophen  (PERCOCET/ROXICET) 5-325 MG tablet Take 1-2 tablets by mouth daily as needed for moderate pain (pain score 4-6). To use prior to dialysis only. (Patient not taking: Reported on 10/07/2023) 30 tablet 0   No current facility-administered medications for this visit.     Abtx:  Anti-infectives (From admission, onward)    None       REVIEW OF SYSTEMS:  Const: negative fever, negative chills, negative weight loss Eyes: negative diplopia or visual changes, negative eye pain ENT: negative coryza, negative sore throat Resp: negative cough, hemoptysis, dyspnea Cards: negative for chest pain, palpitations, lower extremity edema GU: negative for frequency, dysuria and hematuria GI: Negative for abdominal pain, diarrhea, bleeding, constipation Skin: negative for rash and pruritus Heme: negative for easy bruising and gum/nose bleeding MS: negative for myalgias, arthralgias, back pain and muscle weakness Neurolo:negative for  headaches, dizziness, vertigo, memory problems  Psych: negative for feelings of anxiety, depression  Endocrine: negative for thyroid , diabetes Allergy/Immunology- negative for any medication or food allergies ? Pertinent Positives include : Objective:  VITALS:  BP (!) 149/82   Pulse 62   Temp 97.9 F (36.6 C) (Temporal)   SpO2 98%   PHYSICAL EXAM:  General: Alert, cooperative, no distress, appears stated age.in wheel chair  Head: Normocephalic, without obvious abnormality, atraumatic. Eyes: Conjunctivae clear, anicteric sclerae.  Pupils are equal ENT Nares normal. No drainage or sinus tenderness. Lips, mucosa, and tongue normal. No Thrush Neck: Supple, symmetrical, no adenopathy, thyroid : non tender no carotid bruit and no JVD. Back: No CVA tenderness. Lungs: Clear to auscultation bilaterally. No Wheezing or Rhonchi. No rales. Heart: Regular rate and rhythm, no murmur, rub or gallop. Abdomen: Soft, non-tender,not distended. Bowel sounds normal. No masses Extremities: rt AKA Left foot not examined  Left AVF surgical site has sutures  Skin: No rashes or lesions. Or bruising Lymph: Cervical, supraclavicular normal. Neurologic: Grossly non-focal Pertinent Labs Lab Results CBC    Component Value Date/Time   WBC 5.6 09/07/2023 0403   RBC 2.95 (L) 09/07/2023 0403   HGB 8.3 (L) 09/07/2023 0403   HGB 16.8 01/12/2023 1414   HCT 26.7 (L) 09/07/2023 0403   HCT 51.0 01/12/2023 1414   PLT 242 09/07/2023 0403   PLT 257 01/12/2023 1414   MCV 90.5 09/07/2023 0403   MCV 88 01/12/2023 1414   MCH 28.1 09/07/2023 0403   MCHC 31.1 09/07/2023 0403   RDW 18.0 (H) 09/07/2023 0403   RDW 14.8 01/12/2023 1414   LYMPHSABS 0.7 08/29/2023 1958   LYMPHSABS 1.0 01/12/2023 1414   MONOABS 0.5 08/29/2023 1958   EOSABS 0.2 08/29/2023 1958   EOSABS 0.2 01/12/2023 1414   BASOSABS 0.0 08/29/2023 1958   BASOSABS 0.1 01/12/2023 1414       Latest Ref Rng & Units 09/07/2023    4:03 AM 09/06/2023     3:23 AM 09/04/2023    4:37 AM  CMP  Glucose 70 - 99 mg/dL 894  897  897   BUN 8 - 23 mg/dL 28  48  33   Creatinine 0.61 - 1.24 mg/dL 2.71  88.91  2.05   Sodium 135 - 145 mmol/L 134  133  134   Potassium 3.5 - 5.1 mmol/L 4.1  5.0  4.2   Chloride 98 - 111 mmol/L 97  97  97   CO2 22 - 32 mmol/L 28  23  26    Calcium  8.9 - 10.3 mg/dL 8.0  8.1  8.3     ? Impression/Recommendation ?MRSA bacteremia  due to an infected dialysis fistula, which was removed. TEE was negative- repeat blood culture was negative Completed a six-week vancomycin   course on September 29, 2023,  given during dilaysis . Will repeat blood culture today along with cbc    ESRD He undergoes dialysis on Monday, Wednesday, and Friday. The infected fistula was removed, and a new catheter was placed on September 06, 2023. He remains at high risk of infection due to dialysis access.  Non-healing leg wound   Did not address this - followed by PCP  Pneumonia   Pneumonia diagnosed during hospitalization from June 15 to September 07, 2023, has resolved. He has no current symptoms such as cough, shortness of breath, fever, or chills.  Diabetes mellitus   Diabetes management is well-controlled with no specific issues discussed during this visit. Continue the current diabetes management plan.  Will let him know of the results Discharged from my clinic  _________

## 2023-10-07 NOTE — Patient Instructions (Addendum)
 VISIT SUMMARY:  You came in for a follow-up visit after completing your antibiotic treatment for a recent MRSA bloodstream infection. You were hospitalized from June 15th to June 24th, 2025, for this infection and pneumonia. Your dialysis catheter was removed, and a transesophageal echocardiogram (TEE) was performed to rule out endocarditis. You completed a six-week course of antibiotics on July 16th, 2025. You also have a history of diabetes and a chronic leg wound, and you attend dialysis sessions three times a week.  YOUR PLAN:  -MRSA INFECTION: You had a MRSA bloodstream infection due to an infected dialysis fistula, which was removed. After completing a six-week antibiotic course on September 29, 2023, follow-up blood cultures were negative, indicating the infection has resolved. However, you remain at high risk of infection due to your dialysis access needs. Please maintain proper skin hygiene to reduce the risk of future infections.  -CHRONIC KIDNEY DISEASE ON DIALYSIS: You are undergoing dialysis on Monday, Wednesday, and Friday. The infected fistula was removed, and a new catheter was placed on September 06, 2023. You remain at high risk of infection due to your dialysis access. Continue attending your dialysis sessions as scheduled.   -PNEUMONIA: You were diagnosed with pneumonia during your hospitalization from June 15 to September 07, 2023. This condition has resolved, and you have no current symptoms such as cough, shortness of breath, fever, or chills.  -DIABETES MELLITUS: Your diabetes management is well-controlled, and there were no specific issues discussed during this visit. Please continue with your current diabetes management plan.  INSTRUCTIONS:   Continue attending your dialysis sessions on Mondays, Wednesdays, and Fridays. Maintain proper skin hygiene to reduce the risk of future infections. Continue with your current diabetes management plan and follow up with PCP. Will do a blood culture  today to make sure bacteremia has resolved

## 2023-10-07 NOTE — Progress Notes (Deleted)
 There were no vitals taken for this visit.   Subjective:    [name], male    DOB: 31-Jul-1960, 63 y.o.   MRN: [id number]  HPI: Adam Howell is a 63 y.o. male  No chief complaint on file.   Relevant past medical, surgical, family and social history reviewed and updated as indicated. Interim medical history since our last visit reviewed. Allergies and medications reviewed and updated.  Review of Systems  Per HPI unless specifically indicated above     Objective:    There were no vitals taken for this visit.  Wt Readings from Last 3 Encounters:  09/16/23 228 lb (103.4 kg)  09/07/23 228 lb 6.3 oz (103.6 kg)  08/16/23 213 lb 3 oz (96.7 kg)    Physical Exam  Results for orders placed or performed during the hospital encounter of 08/29/23  CBC with Differential   Collection Time: 08/29/23  7:58 PM  Result Value Ref Range   WBC 3.8 (L) 4.0 - 10.5 K/uL   RBC 2.65 (L) 4.22 - 5.81 MIL/uL   Hemoglobin 7.4 (L) 13.0 - 17.0 g/dL   HCT 74.8 (L) 60.9 - 47.9 %   MCV 94.7 80.0 - 100.0 fL   MCH 27.9 26.0 - 34.0 pg   MCHC 29.5 (L) 30.0 - 36.0 g/dL   RDW 80.3 (H) 88.4 - 84.4 %   Platelets 95 (L) 150 - 400 K/uL   nRBC 0.0 0.0 - 0.2 %   Neutrophils Relative % 62 %   Neutro Abs 2.4 1.7 - 7.7 K/uL   Lymphocytes Relative 19 %   Lymphs Abs 0.7 0.7 - 4.0 K/uL   Monocytes Relative 13 %   Monocytes Absolute 0.5 0.1 - 1.0 K/uL   Eosinophils Relative 4 %   Eosinophils Absolute 0.2 0.0 - 0.5 K/uL   Basophils Relative 1 %   Basophils Absolute 0.0 0.0 - 0.1 K/uL   Immature Granulocytes 1 %   Abs Immature Granulocytes 0.02 0.00 - 0.07 K/uL  Comprehensive metabolic panel   Collection Time: 08/29/23  7:58 PM  Result Value Ref Range   Sodium 138 135 - 145 mmol/L   Potassium 4.4 3.5 - 5.1 mmol/L   Chloride 98 98 - 111 mmol/L   CO2 25 22 - 32 mmol/L   Glucose, Bld 97 70 - 99 mg/dL   BUN 41 (H) 8 - 23 mg/dL   Creatinine, Ser 0.11 (H) 0.61 - 1.24 mg/dL   Calcium   8.3 (L) 8.9 - 10.3 mg/dL   Total Protein 6.7 6.5 - 8.1 g/dL   Albumin  2.7 (L) 3.5 - 5.0 g/dL   AST 22 15 - 41 U/L   ALT 13 0 - 44 U/L   Alkaline Phosphatase 69 38 - 126 U/L   Total Bilirubin 0.9 0.0 - 1.2 mg/dL   GFR, Estimated 5 (L) >60 mL/min   Anion gap 15 5 - 15  Troponin I (High Sensitivity)   Collection Time: 08/29/23  7:58 PM  Result Value Ref Range   Troponin I (High Sensitivity) 107 (HH) <18 ng/L  Resp panel by RT-PCR (RSV, Flu A&B, Covid) Anterior Nasal Swab   Collection Time: 08/29/23  8:00 PM   Specimen: Anterior Nasal Swab  Result Value Ref Range   SARS Coronavirus 2 by RT PCR NEGATIVE NEGATIVE   Influenza A by PCR NEGATIVE NEGATIVE   Influenza B by PCR NEGATIVE NEGATIVE   Resp Syncytial Virus by PCR NEGATIVE NEGATIVE  Blood Culture (routine  x 2)   Collection Time: 08/30/23 12:06 AM   Specimen: BLOOD RIGHT HAND  Result Value Ref Range   Specimen Description      BLOOD RIGHT HAND Performed at [facility], [address] [place], [place] [address]    Special Requests      BOTTLES DRAWN AEROBIC AND ANAEROBIC Blood Culture results may not be optimal due to an inadequate volume of blood received in culture bottles Performed at [facility], [address] [place], [place] [address]    Culture  Setup Time      GRAM POSITIVE COCCI IN BOTH AEROBIC AND ANAEROBIC BOTTLES CRITICAL RESULT CALLED TO, READ BACK BY AND VERIFIED WITH: MOSE BLEW 08/30/23 1230 MW Performed at [facility], [address] [place], [place] [address]    Culture (A)     STAPHYLOCOCCUS AUREUS SUSCEPTIBILITIES PERFORMED ON PREVIOUS CULTURE WITHIN THE LAST 5 DAYS. Performed at [facility], [address] [place], [place] [address]    Report Status 09/01/2023 FINAL   Blood Culture (routine x 2)   Collection Time: 08/30/23 12:06 AM   Specimen: BLOOD RIGHT ARM  Result Value Ref Range   Specimen Description      BLOOD RIGHT ARM Performed at [facility], [address] [place], [place] [address]    Special Requests      BOTTLES DRAWN AEROBIC AND ANAEROBIC Blood Culture results may not be optimal due to an inadequate volume of blood received in culture bottles Performed at [facility], [address] [place], [place] [address]    Culture  Setup Time      GRAM POSITIVE COCCI IN BOTH AEROBIC AND ANAEROBIC BOTTLES CRITICAL RESULT CALLED TO, READ BACK BY AND VERIFIED WITH: TREY GREENWOOD 08/30/23 1230 MW GRAM POSITIVE RODS ANAEROBIC BOTTLE ONLY CRITICAL RESULT CALLED TO, READ BACK BY AND VERIFIED WITH: PHARMD W. ANDERSON [id number] @1700  FH Performed at [facility], [address] [place], [place] [address]    Culture (A)     METHICILLIN RESISTANT STAPHYLOCOCCUS AUREUS CLOSTRIDIUM PERFRINGENS    Report Status 09/01/2023 FINAL    Organism ID, Bacteria METHICILLIN RESISTANT STAPHYLOCOCCUS AUREUS       Susceptibility   Methicillin resistant staphylococcus aureus - MIC*    CIPROFLOXACIN >=8 RESISTANT Resistant     ERYTHROMYCIN >=8 RESISTANT Resistant     GENTAMICIN <=0.5 SENSITIVE Sensitive     OXACILLIN >=4 RESISTANT Resistant     TETRACYCLINE >=16 RESISTANT Resistant     VANCOMYCIN  1 SENSITIVE Sensitive     TRIMETH /SULFA  <=10 SENSITIVE Sensitive     CLINDAMYCIN >=8 RESISTANT Resistant     RIFAMPIN <=0.5 SENSITIVE Sensitive     Inducible Clindamycin NEGATIVE Sensitive     LINEZOLID  2 SENSITIVE Sensitive     * METHICILLIN RESISTANT STAPHYLOCOCCUS AUREUS  Blood Culture ID Panel (Reflexed)   Collection Time: 08/30/23 12:06 AM  Result Value Ref Range   Enterococcus faecalis NOT DETECTED NOT DETECTED   Enterococcus Faecium NOT DETECTED NOT DETECTED   Listeria monocytogenes NOT DETECTED NOT DETECTED   Staphylococcus species DETECTED (A) NOT DETECTED   Staphylococcus aureus (BCID) DETECTED (A) NOT DETECTED   Staphylococcus epidermidis NOT DETECTED NOT DETECTED   Staphylococcus lugdunensis NOT DETECTED NOT  DETECTED   Streptococcus species NOT DETECTED NOT DETECTED   Streptococcus agalactiae NOT DETECTED NOT DETECTED   Streptococcus pneumoniae NOT DETECTED NOT DETECTED   Streptococcus pyogenes NOT DETECTED NOT DETECTED   A.calcoaceticus-baumannii NOT DETECTED  NOT DETECTED   Bacteroides fragilis NOT DETECTED NOT DETECTED   Enterobacterales NOT DETECTED NOT DETECTED   Enterobacter cloacae complex NOT DETECTED NOT DETECTED   Escherichia coli NOT DETECTED NOT DETECTED   Klebsiella aerogenes NOT DETECTED NOT DETECTED   Klebsiella oxytoca NOT DETECTED NOT DETECTED   Klebsiella pneumoniae NOT DETECTED NOT DETECTED   Proteus species NOT DETECTED NOT DETECTED   Salmonella species NOT DETECTED NOT DETECTED   Serratia marcescens NOT DETECTED NOT DETECTED   Haemophilus influenzae NOT DETECTED NOT DETECTED   Neisseria meningitidis NOT DETECTED NOT DETECTED   Pseudomonas aeruginosa NOT DETECTED NOT DETECTED   Stenotrophomonas maltophilia NOT DETECTED NOT DETECTED   Candida albicans NOT DETECTED NOT DETECTED   Candida auris NOT DETECTED NOT DETECTED   Candida glabrata NOT DETECTED NOT DETECTED   Candida krusei NOT DETECTED NOT DETECTED   Candida parapsilosis NOT DETECTED NOT DETECTED   Candida tropicalis NOT DETECTED NOT DETECTED   Cryptococcus neoformans/gattii NOT DETECTED NOT DETECTED   Meth resistant mecA/C and MREJ DETECTED (A) NOT DETECTED  Troponin I (High Sensitivity)   Collection Time: 08/30/23 12:06 AM  Result Value Ref Range   Troponin I (High Sensitivity) 98 (H) <18 ng/L  Basic metabolic panel   Collection Time: 08/30/23  4:05 AM  Result Value Ref Range   Sodium 138 135 - 145 mmol/L   Potassium 4.7 3.5 - 5.1 mmol/L   Chloride 94 (L) 98 - 111 mmol/L   CO2 31 22 - 32 mmol/L   Glucose, Bld 94 70 - 99 mg/dL   BUN 47 (H) 8 - 23 mg/dL   Creatinine, Ser 89.64 (H) 0.61 - 1.24 mg/dL   Calcium  8.1 (L) 8.9 - 10.3 mg/dL   GFR, Estimated 5 (L) >60 mL/min   Anion gap 13 5 - 15  CBC    Collection Time: 08/30/23  4:05 AM  Result Value Ref Range   WBC 4.3 4.0 - 10.5 K/uL   RBC 2.51 (L) 4.22 - 5.81 MIL/uL   Hemoglobin 7.0 (L) 13.0 - 17.0 g/dL   HCT 76.2 (L) 60.9 - 47.9 %   MCV 94.4 80.0 - 100.0 fL   MCH 27.9 26.0 - 34.0 pg   MCHC 29.5 (L) 30.0 - 36.0 g/dL   RDW 80.4 (H) 88.4 - 84.4 %   Platelets 99 (L) 150 - 400 K/uL   nRBC 0.0 0.0 - 0.2 %  Hepatitis B surface antigen   Collection Time: 08/30/23  4:05 AM  Result Value Ref Range   Hepatitis B Surface Ag NON REACTIVE NON REACTIVE  Hepatitis B surface antibody,quantitative   Collection Time: 08/30/23  4:05 AM  Result Value Ref Range   Hep B S AB Quant (Post) 348.0 Immunity>10 mIU/mL  Prepare RBC (crossmatch)   Collection Time: 08/30/23  4:28 AM  Result Value Ref Range   Order Confirmation      ORDER PROCESSED BY BLOOD BANK Performed at [facility], [address] [place], [place] [address]   Type and screen [facility]   Collection Time: 08/30/23  4:50 AM  Result Value Ref Range   ABO/RH(D) A POS    Antibody Screen NEG    Sample Expiration 09/02/2023,2359    Unit Number [id number]    Blood Component Type RED CELLS,LR    Unit division 00    Status of Unit REL FROM [facility]    Transfusion Status OK TO TRANSFUSE    Crossmatch Result      Compatible  Performed at [facility], [address] [place], [place] [address]    Unit Number [phone]    Blood Component Type RED CELLS,LR    Unit division 00    Status of Unit ISSUED,FINAL    Transfusion Status OK TO TRANSFUSE    Crossmatch Result Compatible   BPAM RBC   Collection Time: 08/30/23  4:50 AM  Result Value Ref Range   Blood Product Unit Number [id number]    PRODUCT CODE [id number]    Unit Type and Rh 6200    Blood Product Expiration Date 797492817640    ISSUE DATE / TIME [id number]    Blood Product Unit Number [id number]    PRODUCT CODE [id number]    Unit Type and Rh 6200    Blood Product  Expiration Date 797492847640   Prepare RBC (crossmatch)   Collection Time: 08/30/23 10:08 AM  Result Value Ref Range   Order Confirmation      DUPLICATE REQUEST Performed at [facility], [address], [place], [place] [address]   MRSA Next Gen by PCR, Nasal   Collection Time: 08/30/23  2:53 PM   Specimen: Nasal Mucosa; Nasal Swab  Result Value Ref Range   MRSA by PCR Next Gen DETECTED (A) NOT DETECTED  Hemoglobin and hematocrit, blood   Collection Time: 08/30/23  5:43 PM  Result Value Ref Range   Hemoglobin 8.8 (L) 13.0 - 17.0 g/dL   HCT 71.0 (L) 60.9 - 47.9 %  CBC   Collection Time: 08/31/23  3:47 AM  Result Value Ref Range   WBC 5.2 4.0 - 10.5 K/uL   RBC 3.10 (L) 4.22 - 5.81 MIL/uL   Hemoglobin 8.7 (L) 13.0 - 17.0 g/dL   HCT 71.4 (L) 60.9 - 47.9 %   MCV 91.9 80.0 - 100.0 fL   MCH 28.1 26.0 - 34.0 pg   MCHC 30.5 30.0 - 36.0 g/dL   RDW 81.0 (H) 88.4 - 84.4 %   Platelets 88 (L) 150 - 400 K/uL   nRBC 0.0 0.0 - 0.2 %  Basic metabolic panel with GFR   Collection Time: 08/31/23  3:47 AM  Result Value Ref Range   Sodium 133 (L) 135 - 145 mmol/L   Potassium 4.3 3.5 - 5.1 mmol/L   Chloride 95 (L) 98 - 111 mmol/L   CO2 25 22 - 32 mmol/L   Glucose, Bld 87 70 - 99 mg/dL   BUN 32 (H) 8 - 23 mg/dL   Creatinine, Ser 2.81 (H) 0.61 - 1.24 mg/dL   Calcium  8.0 (L) 8.9 - 10.3 mg/dL   GFR, Estimated 8 (L) >60 mL/min   Anion gap 13 5 - 15  Cath Tip Culture   Collection Time: 08/31/23  8:50 AM   Specimen: Catheter Tip; Other  Result Value Ref Range   Specimen Description      CATH TIP Performed at [facility], [address] [place], [place] [address]    Special Requests      NONE Performed at [facility], [address], [place], [place] [address]    Culture      NO GROWTH 2 DAYS Performed at [facility], [address] [place], [place] [address]    Report Status 09/02/2023 FINAL   Culture, blood (single) w Reflex to ID Panel    Collection Time: 08/31/23 10:12 AM   Specimen: BLOOD  Result Value Ref Range   Specimen Description BLOOD RIGHT FA    Special Requests  BOTTLES DRAWN AEROBIC AND ANAEROBIC Blood Culture adequate volume   Culture      NO GROWTH 5 DAYS Performed at [facility], [address] [place], [place] [address]    Report Status 09/05/2023 FINAL   CBC   Collection Time: 09/01/23  4:16 AM  Result Value Ref Range   WBC 4.5 4.0 - 10.5 K/uL   RBC 2.91 (L) 4.22 - 5.81 MIL/uL   Hemoglobin 8.0 (L) 13.0 - 17.0 g/dL   HCT 73.5 (L) 60.9 - 47.9 %   MCV 90.7 80.0 - 100.0 fL   MCH 27.5 26.0 - 34.0 pg   MCHC 30.3 30.0 - 36.0 g/dL   RDW 81.4 (H) 88.4 - 84.4 %   Platelets 91 (L) 150 - 400 K/uL   nRBC 0.0 0.0 - 0.2 %  Basic metabolic panel with GFR   Collection Time: 09/01/23  4:16 AM  Result Value Ref Range   Sodium 133 (L) 135 - 145 mmol/L   Potassium 4.3 3.5 - 5.1 mmol/L   Chloride 94 (L) 98 - 111 mmol/L   CO2 27 22 - 32 mmol/L   Glucose, Bld 100 (H) 70 - 99 mg/dL   BUN 41 (H) 8 - 23 mg/dL   Creatinine, Ser 0.85 (H) 0.61 - 1.24 mg/dL   Calcium  8.2 (L) 8.9 - 10.3 mg/dL   GFR, Estimated 6 (L) >60 mL/min   Anion gap 12 5 - 15  ECHOCARDIOGRAM LIMITED   Collection Time: 09/01/23 10:02 AM  Result Value Ref Range   Weight 3,446.23 oz   Height 75 in   BP 112/59 mmHg   S' Lateral 2.70 cm   Area-P 1/2 4.24 cm2   Est EF 60 - 65%   Aerobic/Anaerobic Culture w Gram Stain (surgical/deep wound)   Collection Time: 09/01/23 12:21 PM   Specimen: Path Tissue  Result Value Ref Range   Specimen Description      TISSUE Performed at [facility], [address], [place], [place] [address]    Special Requests      NONE Performed at [facility], [address] [place], [place] [address]    Gram Stain NO WBC SEEN NO ORGANISMS SEEN     Culture      FEW METHICILLIN RESISTANT STAPHYLOCOCCUS AUREUS NO ANAEROBES ISOLATED Performed at [facility], [address] [place], [place] [address]    Report Status 09/06/2023 FINAL    Organism ID, Bacteria METHICILLIN RESISTANT STAPHYLOCOCCUS AUREUS       Susceptibility   Methicillin resistant staphylococcus aureus - MIC*    CIPROFLOXACIN >=8 RESISTANT Resistant     ERYTHROMYCIN >=8 RESISTANT Resistant     GENTAMICIN <=0.5 SENSITIVE Sensitive     OXACILLIN >=4 RESISTANT Resistant     TETRACYCLINE >=16 RESISTANT Resistant     VANCOMYCIN  1 SENSITIVE Sensitive     TRIMETH /SULFA  <=10 SENSITIVE Sensitive     CLINDAMYCIN >=8 RESISTANT Resistant     RIFAMPIN <=0.5 SENSITIVE Sensitive     Inducible Clindamycin NEGATIVE Sensitive     LINEZOLID  2 SENSITIVE Sensitive     * FEW METHICILLIN RESISTANT STAPHYLOCOCCUS AUREUS  Glucose, capillary   Collection Time: 09/01/23  1:26 PM  Result Value Ref Range   Glucose-Capillary 96 70 - 99 mg/dL  Culture, blood (Routine X 2) w Reflex to ID Panel   Collection Time: 09/02/23  8:08 AM   Specimen: BLOOD  Result Value Ref Range   Specimen Description BLOOD RIGHT ANTECUBITAL  Special Requests      BOTTLES DRAWN AEROBIC AND ANAEROBIC Blood Culture adequate volume   Culture      NO GROWTH 5 DAYS Performed at [facility], [address] [place], [place] [address]    Report Status 09/07/2023 FINAL   Culture, blood (Routine X 2) w Reflex to ID Panel   Collection Time: 09/02/23  8:08 AM   Specimen: BLOOD  Result Value Ref Range   Specimen Description BLOOD BLOOD RIGHT FOREARM    Special Requests      BOTTLES DRAWN AEROBIC AND ANAEROBIC Blood Culture adequate volume   Culture      NO GROWTH 5 DAYS Performed at [facility], [address] [place], [place] [address]    Report Status 09/07/2023 FINAL   Renal function panel   Collection Time: 09/03/23  4:17 AM  Result Value Ref Range   Sodium 135 135 - 145 mmol/L   Potassium 4.7 3.5 - 5.1 mmol/L   Chloride 94 (L) 98 - 111 mmol/L   CO2 28 22 - 32 mmol/L   Glucose, Bld 142 (H) 70 - 99 mg/dL    BUN 60 (H) 8 - 23 mg/dL   Creatinine, Ser 87.58 (H) 0.61 - 1.24 mg/dL   Calcium  8.1 (L) 8.9 - 10.3 mg/dL   Phosphorus 5.0 (H) 2.5 - 4.6 mg/dL   Albumin  2.4 (L) 3.5 - 5.0 g/dL   GFR, Estimated 4 (L) >60 mL/min   Anion gap 13 5 - 15  CBC   Collection Time: 09/03/23  4:17 AM  Result Value Ref Range   WBC 4.6 4.0 - 10.5 K/uL   RBC 2.62 (L) 4.22 - 5.81 MIL/uL   Hemoglobin 7.7 (L) 13.0 - 17.0 g/dL   HCT 75.7 (L) 60.9 - 47.9 %   MCV 92.4 80.0 - 100.0 fL   MCH 29.4 26.0 - 34.0 pg   MCHC 31.8 30.0 - 36.0 g/dL   RDW 81.7 (H) 88.4 - 84.4 %   Platelets 158 150 - 400 K/uL   nRBC 0.6 (H) 0.0 - 0.2 %  ECHO TEE   Collection Time: 09/03/23  1:38 PM  Result Value Ref Range   Est EF 55 - 60%   Expectorated Sputum Assessment w Gram Stain, Rflx to Resp Cult   Collection Time: 09/04/23  3:13 AM   Specimen: Expectorated Sputum  Result Value Ref Range   Specimen Description EXPECTORATED SPUTUM    Special Requests NONE    Sputum evaluation      Sputum specimen not acceptable for testing.  Please recollect.   RESULT CALLED TO, READ BACK BY AND VERIFIED WITH: MARCELITA WINT @ 09/04/23 0448 AB Performed at [facility], [address] [place], [place] [address]    Report Status 09/04/2023 FINAL   Renal function panel   Collection Time: 09/04/23  4:37 AM  Result Value Ref Range   Sodium 134 (L) 135 - 145 mmol/L   Potassium 4.2 3.5 - 5.1 mmol/L   Chloride 97 (L) 98 - 111 mmol/L   CO2 26 22 - 32 mmol/L   Glucose, Bld 102 (H) 70 - 99 mg/dL   BUN 33 (H) 8 - 23 mg/dL   Creatinine, Ser 2.05 (H) 0.61 - 1.24 mg/dL   Calcium  8.3 (L) 8.9 - 10.3 mg/dL   Phosphorus 3.4 2.5 - 4.6 mg/dL   Albumin  2.6 (L) 3.5 - 5.0 g/dL   GFR, Estimated 7 (L) >60 mL/min   Anion gap 11 5 -  15  CBC   Collection Time: 09/04/23  4:37 AM  Result Value Ref Range   WBC 4.1 4.0 - 10.5 K/uL   RBC 3.04 (L) 4.22 - 5.81 MIL/uL   Hemoglobin 8.3 (L) 13.0 - 17.0 g/dL   HCT 72.4 (L) 60.9 - 47.9 %   MCV 90.5 80.0 - 100.0 fL    MCH 27.3 26.0 - 34.0 pg   MCHC 30.2 30.0 - 36.0 g/dL   RDW 82.0 (H) 88.4 - 84.4 %   Platelets 211 150 - 400 K/uL   nRBC 1.0 (H) 0.0 - 0.2 %  Vancomycin , random   Collection Time: 09/06/23  3:23 AM  Result Value Ref Range   Vancomycin  Rm 32 ug/mL  Renal function panel   Collection Time: 09/06/23  3:23 AM  Result Value Ref Range   Sodium 133 (L) 135 - 145 mmol/L   Potassium 5.0 3.5 - 5.1 mmol/L   Chloride 97 (L) 98 - 111 mmol/L   CO2 23 22 - 32 mmol/L   Glucose, Bld 102 (H) 70 - 99 mg/dL   BUN 48 (H) 8 - 23 mg/dL   Creatinine, Ser 88.91 (H) 0.61 - 1.24 mg/dL   Calcium  8.1 (L) 8.9 - 10.3 mg/dL   Phosphorus 3.7 2.5 - 4.6 mg/dL   Albumin  2.4 (L) 3.5 - 5.0 g/dL   GFR, Estimated 5 (L) >60 mL/min   Anion gap 13 5 - 15  CBC   Collection Time: 09/06/23  3:23 AM  Result Value Ref Range   WBC 5.0 4.0 - 10.5 K/uL   RBC 3.21 (L) 4.22 - 5.81 MIL/uL   Hemoglobin 8.6 (L) 13.0 - 17.0 g/dL   HCT 70.9 (L) 60.9 - 47.9 %   MCV 90.3 80.0 - 100.0 fL   MCH 26.8 26.0 - 34.0 pg   MCHC 29.7 (L) 30.0 - 36.0 g/dL   RDW 81.9 (H) 88.4 - 84.4 %   Platelets 225 150 - 400 K/uL   nRBC 0.0 0.0 - 0.2 %  Renal function panel   Collection Time: 09/07/23  4:03 AM  Result Value Ref Range   Sodium 134 (L) 135 - 145 mmol/L   Potassium 4.1 3.5 - 5.1 mmol/L   Chloride 97 (L) 98 - 111 mmol/L   CO2 28 22 - 32 mmol/L   Glucose, Bld 105 (H) 70 - 99 mg/dL   BUN 28 (H) 8 - 23 mg/dL   Creatinine, Ser 2.71 (H) 0.61 - 1.24 mg/dL   Calcium  8.0 (L) 8.9 - 10.3 mg/dL   Phosphorus 3.2 2.5 - 4.6 mg/dL   Albumin  2.4 (L) 3.5 - 5.0 g/dL   GFR, Estimated 8 (L) >60 mL/min   Anion gap 9 5 - 15  CBC   Collection Time: 09/07/23  4:03 AM  Result Value Ref Range   WBC 5.6 4.0 - 10.5 K/uL   RBC 2.95 (L) 4.22 - 5.81 MIL/uL   Hemoglobin 8.3 (L) 13.0 - 17.0 g/dL   HCT 73.2 (L) 60.9 - 47.9 %   MCV 90.5 80.0 - 100.0 fL   MCH 28.1 26.0 - 34.0 pg   MCHC 31.1 30.0 - 36.0 g/dL   RDW 81.9 (H) 88.4 - 84.4 %   Platelets 242 150 - 400  K/uL   nRBC 0.5 (H) 0.0 - 0.2 %      Assessment & Plan:   Problem List Items Addressed This Visit   None    Follow up plan: No follow-ups on file.

## 2023-10-08 DIAGNOSIS — Z992 Dependence on renal dialysis: Secondary | ICD-10-CM | POA: Diagnosis not present

## 2023-10-08 DIAGNOSIS — R52 Pain, unspecified: Secondary | ICD-10-CM | POA: Diagnosis not present

## 2023-10-08 DIAGNOSIS — N186 End stage renal disease: Secondary | ICD-10-CM | POA: Diagnosis not present

## 2023-10-08 DIAGNOSIS — E8779 Other fluid overload: Secondary | ICD-10-CM | POA: Diagnosis not present

## 2023-10-08 DIAGNOSIS — D689 Coagulation defect, unspecified: Secondary | ICD-10-CM | POA: Diagnosis not present

## 2023-10-08 DIAGNOSIS — D509 Iron deficiency anemia, unspecified: Secondary | ICD-10-CM | POA: Diagnosis not present

## 2023-10-08 DIAGNOSIS — N2581 Secondary hyperparathyroidism of renal origin: Secondary | ICD-10-CM | POA: Diagnosis not present

## 2023-10-11 DIAGNOSIS — Z992 Dependence on renal dialysis: Secondary | ICD-10-CM | POA: Diagnosis not present

## 2023-10-11 DIAGNOSIS — N186 End stage renal disease: Secondary | ICD-10-CM | POA: Diagnosis not present

## 2023-10-11 DIAGNOSIS — R52 Pain, unspecified: Secondary | ICD-10-CM | POA: Diagnosis not present

## 2023-10-11 DIAGNOSIS — D509 Iron deficiency anemia, unspecified: Secondary | ICD-10-CM | POA: Diagnosis not present

## 2023-10-11 DIAGNOSIS — E8779 Other fluid overload: Secondary | ICD-10-CM | POA: Diagnosis not present

## 2023-10-11 DIAGNOSIS — D689 Coagulation defect, unspecified: Secondary | ICD-10-CM | POA: Diagnosis not present

## 2023-10-11 DIAGNOSIS — N2581 Secondary hyperparathyroidism of renal origin: Secondary | ICD-10-CM | POA: Diagnosis not present

## 2023-10-12 LAB — CULTURE, BLOOD (ROUTINE X 2)
Culture: NO GROWTH
Culture: NO GROWTH
Special Requests: ADEQUATE
Special Requests: ADEQUATE

## 2023-10-13 DIAGNOSIS — Z89611 Acquired absence of right leg above knee: Secondary | ICD-10-CM | POA: Diagnosis not present

## 2023-10-13 DIAGNOSIS — Z7901 Long term (current) use of anticoagulants: Secondary | ICD-10-CM | POA: Diagnosis not present

## 2023-10-13 DIAGNOSIS — I509 Heart failure, unspecified: Secondary | ICD-10-CM | POA: Diagnosis not present

## 2023-10-13 DIAGNOSIS — E11621 Type 2 diabetes mellitus with foot ulcer: Secondary | ICD-10-CM | POA: Diagnosis not present

## 2023-10-13 DIAGNOSIS — G894 Chronic pain syndrome: Secondary | ICD-10-CM | POA: Diagnosis not present

## 2023-10-13 DIAGNOSIS — N186 End stage renal disease: Secondary | ICD-10-CM | POA: Diagnosis not present

## 2023-10-13 DIAGNOSIS — I482 Chronic atrial fibrillation, unspecified: Secondary | ICD-10-CM | POA: Diagnosis not present

## 2023-10-13 DIAGNOSIS — E1169 Type 2 diabetes mellitus with other specified complication: Secondary | ICD-10-CM | POA: Diagnosis not present

## 2023-10-13 DIAGNOSIS — D509 Iron deficiency anemia, unspecified: Secondary | ICD-10-CM | POA: Diagnosis not present

## 2023-10-13 DIAGNOSIS — Z556 Problems related to health literacy: Secondary | ICD-10-CM | POA: Diagnosis not present

## 2023-10-13 DIAGNOSIS — R52 Pain, unspecified: Secondary | ICD-10-CM | POA: Diagnosis not present

## 2023-10-13 DIAGNOSIS — L97522 Non-pressure chronic ulcer of other part of left foot with fat layer exposed: Secondary | ICD-10-CM | POA: Diagnosis not present

## 2023-10-13 DIAGNOSIS — J690 Pneumonitis due to inhalation of food and vomit: Secondary | ICD-10-CM | POA: Diagnosis not present

## 2023-10-13 DIAGNOSIS — E1122 Type 2 diabetes mellitus with diabetic chronic kidney disease: Secondary | ICD-10-CM | POA: Diagnosis not present

## 2023-10-13 DIAGNOSIS — I132 Hypertensive heart and chronic kidney disease with heart failure and with stage 5 chronic kidney disease, or end stage renal disease: Secondary | ICD-10-CM | POA: Diagnosis not present

## 2023-10-13 DIAGNOSIS — D689 Coagulation defect, unspecified: Secondary | ICD-10-CM | POA: Diagnosis not present

## 2023-10-13 DIAGNOSIS — N2581 Secondary hyperparathyroidism of renal origin: Secondary | ICD-10-CM | POA: Diagnosis not present

## 2023-10-13 DIAGNOSIS — Z993 Dependence on wheelchair: Secondary | ICD-10-CM | POA: Diagnosis not present

## 2023-10-13 DIAGNOSIS — I2510 Atherosclerotic heart disease of native coronary artery without angina pectoris: Secondary | ICD-10-CM | POA: Diagnosis not present

## 2023-10-13 DIAGNOSIS — Z992 Dependence on renal dialysis: Secondary | ICD-10-CM | POA: Diagnosis not present

## 2023-10-13 DIAGNOSIS — L97821 Non-pressure chronic ulcer of other part of left lower leg limited to breakdown of skin: Secondary | ICD-10-CM | POA: Diagnosis not present

## 2023-10-13 DIAGNOSIS — D61818 Other pancytopenia: Secondary | ICD-10-CM | POA: Diagnosis not present

## 2023-10-13 DIAGNOSIS — I700 Atherosclerosis of aorta: Secondary | ICD-10-CM | POA: Diagnosis not present

## 2023-10-13 DIAGNOSIS — E785 Hyperlipidemia, unspecified: Secondary | ICD-10-CM | POA: Diagnosis not present

## 2023-10-13 DIAGNOSIS — D631 Anemia in chronic kidney disease: Secondary | ICD-10-CM | POA: Diagnosis not present

## 2023-10-13 DIAGNOSIS — I5032 Chronic diastolic (congestive) heart failure: Secondary | ICD-10-CM | POA: Diagnosis not present

## 2023-10-13 DIAGNOSIS — I87312 Chronic venous hypertension (idiopathic) with ulcer of left lower extremity: Secondary | ICD-10-CM | POA: Diagnosis not present

## 2023-10-13 DIAGNOSIS — E8779 Other fluid overload: Secondary | ICD-10-CM | POA: Diagnosis not present

## 2023-10-14 DIAGNOSIS — E11621 Type 2 diabetes mellitus with foot ulcer: Secondary | ICD-10-CM | POA: Diagnosis not present

## 2023-10-14 DIAGNOSIS — N186 End stage renal disease: Secondary | ICD-10-CM | POA: Diagnosis not present

## 2023-10-14 DIAGNOSIS — E1122 Type 2 diabetes mellitus with diabetic chronic kidney disease: Secondary | ICD-10-CM | POA: Diagnosis not present

## 2023-10-14 DIAGNOSIS — I509 Heart failure, unspecified: Secondary | ICD-10-CM | POA: Diagnosis not present

## 2023-10-14 DIAGNOSIS — L97522 Non-pressure chronic ulcer of other part of left foot with fat layer exposed: Secondary | ICD-10-CM | POA: Diagnosis not present

## 2023-10-14 DIAGNOSIS — I132 Hypertensive heart and chronic kidney disease with heart failure and with stage 5 chronic kidney disease, or end stage renal disease: Secondary | ICD-10-CM | POA: Diagnosis not present

## 2023-10-14 DIAGNOSIS — Z89611 Acquired absence of right leg above knee: Secondary | ICD-10-CM | POA: Diagnosis not present

## 2023-10-14 DIAGNOSIS — Z992 Dependence on renal dialysis: Secondary | ICD-10-CM | POA: Diagnosis not present

## 2023-10-14 DIAGNOSIS — D631 Anemia in chronic kidney disease: Secondary | ICD-10-CM | POA: Diagnosis not present

## 2023-10-14 DIAGNOSIS — Z7901 Long term (current) use of anticoagulants: Secondary | ICD-10-CM | POA: Diagnosis not present

## 2023-10-14 DIAGNOSIS — L97821 Non-pressure chronic ulcer of other part of left lower leg limited to breakdown of skin: Secondary | ICD-10-CM | POA: Diagnosis not present

## 2023-10-14 DIAGNOSIS — E785 Hyperlipidemia, unspecified: Secondary | ICD-10-CM | POA: Diagnosis not present

## 2023-10-14 DIAGNOSIS — Z556 Problems related to health literacy: Secondary | ICD-10-CM | POA: Diagnosis not present

## 2023-10-14 DIAGNOSIS — Z993 Dependence on wheelchair: Secondary | ICD-10-CM | POA: Diagnosis not present

## 2023-10-14 DIAGNOSIS — I120 Hypertensive chronic kidney disease with stage 5 chronic kidney disease or end stage renal disease: Secondary | ICD-10-CM | POA: Diagnosis not present

## 2023-10-14 DIAGNOSIS — I87312 Chronic venous hypertension (idiopathic) with ulcer of left lower extremity: Secondary | ICD-10-CM | POA: Diagnosis not present

## 2023-10-15 ENCOUNTER — Ambulatory Visit: Payer: Self-pay

## 2023-10-15 ENCOUNTER — Telehealth: Payer: Self-pay

## 2023-10-15 NOTE — Telephone Encounter (Signed)
 Copied from CRM [id number]. Topic: General - Other >> Oct 15, 2023  1:00 PM Santiya F wrote: Reason for CRM: Maryann with [facility] saw patient on Wednesday and was supposed to see patient today after his dialysis appointment and patient was not at home. She spoke with a neighbor who said patient had left prior to her coming.

## 2023-10-18 DIAGNOSIS — E8779 Other fluid overload: Secondary | ICD-10-CM | POA: Diagnosis not present

## 2023-10-18 DIAGNOSIS — Z992 Dependence on renal dialysis: Secondary | ICD-10-CM | POA: Diagnosis not present

## 2023-10-18 DIAGNOSIS — R52 Pain, unspecified: Secondary | ICD-10-CM | POA: Diagnosis not present

## 2023-10-18 DIAGNOSIS — N186 End stage renal disease: Secondary | ICD-10-CM | POA: Diagnosis not present

## 2023-10-18 DIAGNOSIS — D509 Iron deficiency anemia, unspecified: Secondary | ICD-10-CM | POA: Diagnosis not present

## 2023-10-18 DIAGNOSIS — D689 Coagulation defect, unspecified: Secondary | ICD-10-CM | POA: Diagnosis not present

## 2023-10-18 DIAGNOSIS — N2581 Secondary hyperparathyroidism of renal origin: Secondary | ICD-10-CM | POA: Diagnosis not present

## 2023-10-21 ENCOUNTER — Telehealth: Payer: Self-pay

## 2023-10-21 DIAGNOSIS — I2510 Atherosclerotic heart disease of native coronary artery without angina pectoris: Secondary | ICD-10-CM | POA: Diagnosis not present

## 2023-10-21 DIAGNOSIS — D631 Anemia in chronic kidney disease: Secondary | ICD-10-CM | POA: Diagnosis not present

## 2023-10-21 DIAGNOSIS — Z993 Dependence on wheelchair: Secondary | ICD-10-CM | POA: Diagnosis not present

## 2023-10-21 DIAGNOSIS — Z556 Problems related to health literacy: Secondary | ICD-10-CM | POA: Diagnosis not present

## 2023-10-21 DIAGNOSIS — D61818 Other pancytopenia: Secondary | ICD-10-CM | POA: Diagnosis not present

## 2023-10-21 DIAGNOSIS — J690 Pneumonitis due to inhalation of food and vomit: Secondary | ICD-10-CM | POA: Diagnosis not present

## 2023-10-21 DIAGNOSIS — E785 Hyperlipidemia, unspecified: Secondary | ICD-10-CM | POA: Diagnosis not present

## 2023-10-21 DIAGNOSIS — I5032 Chronic diastolic (congestive) heart failure: Secondary | ICD-10-CM | POA: Diagnosis not present

## 2023-10-21 DIAGNOSIS — I87312 Chronic venous hypertension (idiopathic) with ulcer of left lower extremity: Secondary | ICD-10-CM | POA: Diagnosis not present

## 2023-10-21 DIAGNOSIS — L97821 Non-pressure chronic ulcer of other part of left lower leg limited to breakdown of skin: Secondary | ICD-10-CM | POA: Diagnosis not present

## 2023-10-21 DIAGNOSIS — E1122 Type 2 diabetes mellitus with diabetic chronic kidney disease: Secondary | ICD-10-CM | POA: Diagnosis not present

## 2023-10-21 DIAGNOSIS — E11621 Type 2 diabetes mellitus with foot ulcer: Secondary | ICD-10-CM | POA: Diagnosis not present

## 2023-10-21 DIAGNOSIS — N186 End stage renal disease: Secondary | ICD-10-CM | POA: Diagnosis not present

## 2023-10-21 DIAGNOSIS — I509 Heart failure, unspecified: Secondary | ICD-10-CM | POA: Diagnosis not present

## 2023-10-21 DIAGNOSIS — G894 Chronic pain syndrome: Secondary | ICD-10-CM | POA: Diagnosis not present

## 2023-10-21 DIAGNOSIS — I132 Hypertensive heart and chronic kidney disease with heart failure and with stage 5 chronic kidney disease, or end stage renal disease: Secondary | ICD-10-CM | POA: Diagnosis not present

## 2023-10-21 DIAGNOSIS — E1169 Type 2 diabetes mellitus with other specified complication: Secondary | ICD-10-CM | POA: Diagnosis not present

## 2023-10-21 DIAGNOSIS — I700 Atherosclerosis of aorta: Secondary | ICD-10-CM | POA: Diagnosis not present

## 2023-10-21 DIAGNOSIS — Z7901 Long term (current) use of anticoagulants: Secondary | ICD-10-CM | POA: Diagnosis not present

## 2023-10-21 DIAGNOSIS — Z89611 Acquired absence of right leg above knee: Secondary | ICD-10-CM | POA: Diagnosis not present

## 2023-10-21 DIAGNOSIS — Z992 Dependence on renal dialysis: Secondary | ICD-10-CM | POA: Diagnosis not present

## 2023-10-21 DIAGNOSIS — I482 Chronic atrial fibrillation, unspecified: Secondary | ICD-10-CM | POA: Diagnosis not present

## 2023-10-21 DIAGNOSIS — L97522 Non-pressure chronic ulcer of other part of left foot with fat layer exposed: Secondary | ICD-10-CM | POA: Diagnosis not present

## 2023-10-21 NOTE — Telephone Encounter (Unsigned)
 Copied from CRM [id number]. Topic: Clinical - Home Health Verbal Orders >> Oct 21, 2023  1:55 PM Nathanel BROCKS wrote: Caller/Agency: [facility] Callback Number: [phone] Service Requested: Skilled Nursing Frequency: twice a week for 9 weeks Any new concerns about the patient? No

## 2023-10-21 NOTE — Progress Notes (Signed)
 Created in error

## 2023-10-22 DIAGNOSIS — N2581 Secondary hyperparathyroidism of renal origin: Secondary | ICD-10-CM | POA: Diagnosis not present

## 2023-10-22 DIAGNOSIS — Z992 Dependence on renal dialysis: Secondary | ICD-10-CM | POA: Diagnosis not present

## 2023-10-22 DIAGNOSIS — E8779 Other fluid overload: Secondary | ICD-10-CM | POA: Diagnosis not present

## 2023-10-22 DIAGNOSIS — D689 Coagulation defect, unspecified: Secondary | ICD-10-CM | POA: Diagnosis not present

## 2023-10-22 DIAGNOSIS — D509 Iron deficiency anemia, unspecified: Secondary | ICD-10-CM | POA: Diagnosis not present

## 2023-10-22 DIAGNOSIS — R52 Pain, unspecified: Secondary | ICD-10-CM | POA: Diagnosis not present

## 2023-10-22 NOTE — Telephone Encounter (Signed)
 Called and gave verbal orders per Jolene.

## 2023-10-25 ENCOUNTER — Other Ambulatory Visit: Payer: Self-pay

## 2023-10-25 ENCOUNTER — Emergency Department

## 2023-10-25 ENCOUNTER — Inpatient Hospital Stay
Admission: EM | Admit: 2023-10-25 | Discharge: 2023-11-01 | DRG: 193 | Disposition: A | Discharge status: Home Health | Attending: Student in an Organized Health Care Education/Training Program | Admitting: Student in an Organized Health Care Education/Training Program

## 2023-10-25 DIAGNOSIS — L97522 Non-pressure chronic ulcer of other part of left foot with fat layer exposed: Secondary | ICD-10-CM | POA: Diagnosis not present

## 2023-10-25 DIAGNOSIS — R0602 Shortness of breath: Secondary | ICD-10-CM | POA: Diagnosis not present

## 2023-10-25 DIAGNOSIS — L97821 Non-pressure chronic ulcer of other part of left lower leg limited to breakdown of skin: Secondary | ICD-10-CM | POA: Diagnosis not present

## 2023-10-25 DIAGNOSIS — I2510 Atherosclerotic heart disease of native coronary artery without angina pectoris: Secondary | ICD-10-CM | POA: Diagnosis not present

## 2023-10-25 DIAGNOSIS — D689 Coagulation defect, unspecified: Secondary | ICD-10-CM | POA: Diagnosis not present

## 2023-10-25 DIAGNOSIS — G894 Chronic pain syndrome: Secondary | ICD-10-CM | POA: Diagnosis not present

## 2023-10-25 DIAGNOSIS — I132 Hypertensive heart and chronic kidney disease with heart failure and with stage 5 chronic kidney disease, or end stage renal disease: Secondary | ICD-10-CM | POA: Diagnosis not present

## 2023-10-25 DIAGNOSIS — D509 Iron deficiency anemia, unspecified: Secondary | ICD-10-CM | POA: Diagnosis not present

## 2023-10-25 DIAGNOSIS — R531 Weakness: Secondary | ICD-10-CM | POA: Diagnosis not present

## 2023-10-25 DIAGNOSIS — I5032 Chronic diastolic (congestive) heart failure: Secondary | ICD-10-CM | POA: Diagnosis present

## 2023-10-25 DIAGNOSIS — G4700 Insomnia, unspecified: Secondary | ICD-10-CM | POA: Diagnosis present

## 2023-10-25 DIAGNOSIS — Z7982 Long term (current) use of aspirin: Secondary | ICD-10-CM

## 2023-10-25 DIAGNOSIS — Z1152 Encounter for screening for COVID-19: Secondary | ICD-10-CM | POA: Diagnosis not present

## 2023-10-25 DIAGNOSIS — D631 Anemia in chronic kidney disease: Secondary | ICD-10-CM | POA: Diagnosis not present

## 2023-10-25 DIAGNOSIS — I9589 Other hypotension: Secondary | ICD-10-CM | POA: Diagnosis not present

## 2023-10-25 DIAGNOSIS — J189 Pneumonia, unspecified organism: Secondary | ICD-10-CM | POA: Diagnosis not present

## 2023-10-25 DIAGNOSIS — Z992 Dependence on renal dialysis: Secondary | ICD-10-CM

## 2023-10-25 DIAGNOSIS — Z7901 Long term (current) use of anticoagulants: Secondary | ICD-10-CM | POA: Diagnosis not present

## 2023-10-25 DIAGNOSIS — I517 Cardiomegaly: Secondary | ICD-10-CM | POA: Diagnosis not present

## 2023-10-25 DIAGNOSIS — E785 Hyperlipidemia, unspecified: Secondary | ICD-10-CM | POA: Diagnosis not present

## 2023-10-25 DIAGNOSIS — N186 End stage renal disease: Secondary | ICD-10-CM

## 2023-10-25 DIAGNOSIS — R52 Pain, unspecified: Secondary | ICD-10-CM | POA: Diagnosis not present

## 2023-10-25 DIAGNOSIS — R0902 Hypoxemia: Secondary | ICD-10-CM

## 2023-10-25 DIAGNOSIS — E669 Obesity, unspecified: Secondary | ICD-10-CM | POA: Diagnosis present

## 2023-10-25 DIAGNOSIS — Z743 Need for continuous supervision: Secondary | ICD-10-CM | POA: Diagnosis not present

## 2023-10-25 DIAGNOSIS — R69 Illness, unspecified: Secondary | ICD-10-CM | POA: Diagnosis not present

## 2023-10-25 DIAGNOSIS — E66811 Obesity, class 1: Secondary | ICD-10-CM | POA: Diagnosis present

## 2023-10-25 DIAGNOSIS — E663 Overweight: Secondary | ICD-10-CM | POA: Diagnosis present

## 2023-10-25 DIAGNOSIS — I482 Chronic atrial fibrillation, unspecified: Secondary | ICD-10-CM | POA: Diagnosis present

## 2023-10-25 DIAGNOSIS — Y95 Nosocomial condition: Secondary | ICD-10-CM | POA: Diagnosis present

## 2023-10-25 DIAGNOSIS — E1122 Type 2 diabetes mellitus with diabetic chronic kidney disease: Secondary | ICD-10-CM | POA: Diagnosis not present

## 2023-10-25 DIAGNOSIS — E8779 Other fluid overload: Secondary | ICD-10-CM | POA: Diagnosis not present

## 2023-10-25 DIAGNOSIS — J9601 Acute respiratory failure with hypoxia: Secondary | ICD-10-CM | POA: Diagnosis not present

## 2023-10-25 DIAGNOSIS — Z8249 Family history of ischemic heart disease and other diseases of the circulatory system: Secondary | ICD-10-CM

## 2023-10-25 DIAGNOSIS — J90 Pleural effusion, not elsewhere classified: Secondary | ICD-10-CM | POA: Diagnosis not present

## 2023-10-25 DIAGNOSIS — Z6830 Body mass index (BMI) 30.0-30.9, adult: Secondary | ICD-10-CM

## 2023-10-25 DIAGNOSIS — E1169 Type 2 diabetes mellitus with other specified complication: Secondary | ICD-10-CM | POA: Diagnosis not present

## 2023-10-25 DIAGNOSIS — N2581 Secondary hyperparathyroidism of renal origin: Secondary | ICD-10-CM | POA: Diagnosis not present

## 2023-10-25 DIAGNOSIS — Z79899 Other long term (current) drug therapy: Secondary | ICD-10-CM

## 2023-10-25 DIAGNOSIS — Z89611 Acquired absence of right leg above knee: Secondary | ICD-10-CM | POA: Diagnosis not present

## 2023-10-25 DIAGNOSIS — E11621 Type 2 diabetes mellitus with foot ulcer: Secondary | ICD-10-CM | POA: Diagnosis not present

## 2023-10-25 DIAGNOSIS — I87312 Chronic venous hypertension (idiopathic) with ulcer of left lower extremity: Secondary | ICD-10-CM | POA: Diagnosis not present

## 2023-10-25 DIAGNOSIS — I120 Hypertensive chronic kidney disease with stage 5 chronic kidney disease or end stage renal disease: Secondary | ICD-10-CM | POA: Diagnosis not present

## 2023-10-25 DIAGNOSIS — R059 Cough, unspecified: Secondary | ICD-10-CM | POA: Diagnosis not present

## 2023-10-25 DIAGNOSIS — E8770 Fluid overload, unspecified: Secondary | ICD-10-CM | POA: Diagnosis present

## 2023-10-25 DIAGNOSIS — Z91041 Radiographic dye allergy status: Secondary | ICD-10-CM

## 2023-10-25 DIAGNOSIS — I700 Atherosclerosis of aorta: Secondary | ICD-10-CM | POA: Diagnosis not present

## 2023-10-25 DIAGNOSIS — Z885 Allergy status to narcotic agent status: Secondary | ICD-10-CM

## 2023-10-25 DIAGNOSIS — R519 Headache, unspecified: Secondary | ICD-10-CM | POA: Diagnosis not present

## 2023-10-25 DIAGNOSIS — R918 Other nonspecific abnormal finding of lung field: Secondary | ICD-10-CM | POA: Diagnosis not present

## 2023-10-25 LAB — BRAIN NATRIURETIC PEPTIDE: B Natriuretic Peptide: 1182.8 pg/mL — ABNORMAL HIGH (ref 0.0–100.0)

## 2023-10-25 LAB — COMPREHENSIVE METABOLIC PANEL WITH GFR
ALT: 29 U/L (ref 0–44)
AST: 41 U/L (ref 15–41)
Albumin: 3.4 g/dL — ABNORMAL LOW (ref 3.5–5.0)
Alkaline Phosphatase: 81 U/L (ref 38–126)
Anion gap: 15 (ref 5–15)
BUN: 26 mg/dL — ABNORMAL HIGH (ref 8–23)
CO2: 30 mmol/L (ref 22–32)
Calcium: 8.8 mg/dL — ABNORMAL LOW (ref 8.9–10.3)
Chloride: 95 mmol/L — ABNORMAL LOW (ref 98–111)
Creatinine, Ser: 5.39 mg/dL — ABNORMAL HIGH (ref 0.61–1.24)
GFR, Estimated: 11 mL/min — ABNORMAL LOW (ref >60)
Glucose, Bld: 87 mg/dL (ref 70–99)
Potassium: 3.6 mmol/L (ref 3.5–5.1)
Sodium: 140 mmol/L (ref 135–145)
Total Bilirubin: 1.2 mg/dL (ref 0.0–1.2)
Total Protein: 8.4 g/dL — ABNORMAL HIGH (ref 6.5–8.1)

## 2023-10-25 LAB — CBC WITH DIFFERENTIAL/PLATELET
Abs Immature Granulocytes: 0.05 K/uL (ref 0.00–0.07)
Basophils Absolute: 0.1 K/uL (ref 0.0–0.1)
Basophils Relative: 1 %
Eosinophils Absolute: 0.2 K/uL (ref 0.0–0.5)
Eosinophils Relative: 2 %
HCT: 29.6 % — ABNORMAL LOW (ref 39.0–52.0)
Hemoglobin: 9.1 g/dL — ABNORMAL LOW (ref 13.0–17.0)
Immature Granulocytes: 1 %
Lymphocytes Relative: 6 %
Lymphs Abs: 0.5 K/uL — ABNORMAL LOW (ref 0.7–4.0)
MCH: 26.4 pg (ref 26.0–34.0)
MCHC: 30.7 g/dL (ref 30.0–36.0)
MCV: 85.8 fL (ref 80.0–100.0)
Monocytes Absolute: 0.5 K/uL (ref 0.1–1.0)
Monocytes Relative: 6 %
Neutro Abs: 6.7 K/uL (ref 1.7–7.7)
Neutrophils Relative %: 84 %
Platelets: 170 K/uL (ref 150–400)
RBC: 3.45 MIL/uL — ABNORMAL LOW (ref 4.22–5.81)
RDW: 19 % — ABNORMAL HIGH (ref 11.5–15.5)
WBC: 7.9 K/uL (ref 4.0–10.5)
nRBC: 0.3 % — ABNORMAL HIGH (ref 0.0–0.2)

## 2023-10-25 LAB — PROCALCITONIN: Procalcitonin: 1.05 ng/mL

## 2023-10-25 MED ORDER — ONDANSETRON HCL 4 MG/2ML IJ SOLN
4.0000 mg | Freq: Once | INTRAMUSCULAR | Status: AC
  Administered 2023-10-26 (×2): 4 mg via INTRAVENOUS
  Filled 2023-10-25: qty 2

## 2023-10-25 MED ORDER — ACETAMINOPHEN 325 MG PO TABS: 650.0000 mg | ORAL_TABLET | Freq: Four times a day (QID) | ORAL | Status: DC | PRN

## 2023-10-25 MED ORDER — HYDRALAZINE HCL 20 MG/ML IJ SOLN: 5.0000 mg | INTRAMUSCULAR | Status: DC | PRN

## 2023-10-25 MED ORDER — ALBUTEROL SULFATE (2.5 MG/3ML) 0.083% IN NEBU
2.5000 mg | INHALATION_SOLUTION | RESPIRATORY_TRACT | Status: DC | PRN
  Administered 2023-10-30: 2.5 mg via RESPIRATORY_TRACT
  Filled 2023-10-25: qty 3

## 2023-10-25 MED ORDER — VANCOMYCIN HCL IN DEXTROSE 1-5 GM/200ML-% IV SOLN
1000.0000 mg | Freq: Once | INTRAVENOUS | Status: AC
  Administered 2023-10-26 (×2): 1000 mg via INTRAVENOUS
  Filled 2023-10-25: qty 200

## 2023-10-25 MED ORDER — DM-GUAIFENESIN ER 30-600 MG PO TB12
1.0000 | ORAL_TABLET | Freq: Two times a day (BID) | ORAL | Status: DC | PRN
  Administered 2023-10-28: 1 via ORAL
  Filled 2023-10-25: qty 1

## 2023-10-25 MED ORDER — IPRATROPIUM-ALBUTEROL 0.5-2.5 (3) MG/3ML IN SOLN
3.0000 mL | Freq: Once | RESPIRATORY_TRACT | Status: AC
  Administered 2023-10-26 (×2): 3 mL via RESPIRATORY_TRACT
  Filled 2023-10-25: qty 3

## 2023-10-25 MED ORDER — SODIUM CHLORIDE 0.9 % IV SOLN
500.0000 mg | Freq: Once | INTRAVENOUS | Status: AC
  Administered 2023-10-26 (×2): 500 mg via INTRAVENOUS
  Filled 2023-10-25: qty 5

## 2023-10-25 MED ORDER — SODIUM CHLORIDE 0.9 % IV SOLN
2.0000 g | Freq: Once | INTRAVENOUS | Status: AC
  Administered 2023-10-26 (×2): 2 g via INTRAVENOUS
  Filled 2023-10-25: qty 12.5

## 2023-10-25 MED ORDER — ONDANSETRON HCL 4 MG/2ML IJ SOLN
4.0000 mg | Freq: Three times a day (TID) | INTRAMUSCULAR | Status: DC | PRN
  Administered 2023-10-28 – 2023-10-29 (×2): 4 mg via INTRAVENOUS
  Filled 2023-10-25 (×2): qty 2

## 2023-10-25 NOTE — ED Notes (Signed)
 IV team at bedside

## 2023-10-25 NOTE — H&P (Signed)
 History and Physical    Adam Howell FMW:[id number] DOB: 30-May-1960 DOA: 10/25/2023  Referring MD/NP/PA:   PCP: Valerio Melanie DASEN, NP   Patient coming from:  The patient is coming from home.     Chief Complaint: SOB  HPI: Adam Howell is a 63 y.o. male with medical history significant of  ESRD-HD (MWF), HTN, HLD, diet-controlled DM, CAD, dCHF, A fib on Eliquis , amenia, chronic pain, s/p of right AKA, who presents with SOB.  Patient states that he has a dry cough, SOB in the past 2 days, which has been progressively worsening.  He reports possible wheezing, but by my examination of lung auscultation, patient does not have wheezing.  No fever or chills.  No nausea, vomiting, diarrhea or abdominal cramping no symptoms UTI.  Patient had dialysis on Monday.  He is taking his Eliquis  consistently.  Patient does not use oxygen at baseline, but today he has 3 L new oxygen requirement with 100% saturation.   Data reviewed independently and ED Course: pt was found to have WBC 7.9, procalcitonin 1.05, negative PCR for COVID, flu and RSV, temperature normal, blood pressure 130/59, heart rate 74, RR 30.  Patient is admitted to telemetry bed as inpatient.   Chest x-ray: 1. Cardiomegaly with small bilateral pleural effusions. 2. Minimal patchy retrocardiac opacities, atelectasis versus infiltrate.   EKG: I have personally reviewed.  A-fib, QTc 497, LAD, left bundle blockade, poor R wave progression.   Review of Systems:   General: no fevers, chills, no body weight gain, has fatigue HEENT: no blurry vision, hearing changes or sore throat Respiratory: Has dyspnea, coughing, no wheezing CV: no chest pain, no palpitations GI: no nausea, vomiting, abdominal pain, diarrhea, constipation GU: no dysuria, burning on urination, increased urinary frequency, hematuria  Ext: Has leg edema Neuro: no unilateral weakness, numbness, or tingling, no vision change or hearing loss Skin: no rash, no  skin tear. MSK: No muscle spasm, no deformity, no limitation of range of movement in spin Heme: No easy bruising.  Travel history: No recent long distant travel.   Allergy:  Allergies  Allergen Reactions   Ivp Dye [Iodinated Contrast Media] Anaphylaxis   Tramadol  Rash    Past Medical History:  Diagnosis Date   Anemia in chronic kidney disease    Cellulitis of left lower extremity    Chronic atrial fibrillation (HCC)    Chronic kidney disease    Congestive heart failure (HCC)    Difficult intubation    End-stage renal disease on hemodialysis (HCC)    Hyperlipidemia    Hypertension    Type 2 diabetes mellitus with complication [facility])     Past Surgical History:  Procedure Laterality Date   A/V FISTULAGRAM Left 08/27/2022   Procedure: A/V Fistulagram;  Surgeon: Marea Selinda RAMAN, MD;  Location: [facility];  Service: Cardiovascular;  Laterality: Left;   A/V FISTULAGRAM Left 08/12/2023   Procedure: A/V Fistulagram;  Surgeon: Marea Selinda RAMAN, MD;  Location: [facility];  Service: Cardiovascular;  Laterality: Left;   AMPUTATION Right 03/13/2022   Procedure: AMPUTATION ABOVE KNEE;  Surgeon: Marea Selinda RAMAN, MD;  Location: [facility] OR[facility];  Service: General;  Laterality: Right;   APPLICATION OF WOUND VAC Right 03/07/2022   Procedure: APPLICATION OF WOUND VAC;  Surgeon: Tisa Curry LABOR, MD;  Location: [facility];  Service: Vascular;  Laterality: Right;  GAAC [id number]   CENTRAL LINE INSERTION Left 08/05/2023   Procedure: CENTRAL LINE INSERTION;  Surgeon: Marea Selinda RAMAN,  MD;  Location: [facility];  Service: Vascular;  Laterality: Left;   DIALYSIS/PERMA CATHETER INSERTION Right 08/05/2023   Procedure: DIALYSIS/PERMA CATHETER INSERTION;  Surgeon: Marea Selinda RAMAN, MD;  Location: [facility];  Service: Vascular;  Laterality: Right;   DIALYSIS/PERMA CATHETER INSERTION N/A 09/06/2023   Procedure: DIALYSIS/PERMA CATHETER INSERTION;  Surgeon: Marea Selinda RAMAN, MD;  Location: [facility];  Service:  Cardiovascular;  Laterality: N/A;   DIALYSIS/PERMA CATHETER REMOVAL Right 08/31/2023   Procedure: DIALYSIS/PERMA CATHETER REMOVAL;  Surgeon: Jama Cordella MATSU, MD;  Location: [facility];  Service: Cardiovascular;  Laterality: Right;   GROIN DISSECTION Right 08/05/2023   Procedure: right groin venogram, angioplasty of superior venacava and anomic vein;  Surgeon: Marea Selinda RAMAN, MD;  Location: [facility];  Service: Vascular;  Laterality: Right;   LOWER EXTREMITY ANGIOGRAPHY Left 02/15/2023   Procedure: Lower Extremity Angiography;  Surgeon: Marea Selinda RAMAN, MD;  Location: [facility];  Service: Cardiovascular;  Laterality: Left;   REVISON OF ARTERIOVENOUS FISTULA Left 08/05/2023   Procedure: REVISON OF ARTERIOVENOUS FISTULA;  Surgeon: Marea Selinda RAMAN, MD;  Location: [facility];  Service: Vascular;  Laterality: Left;  JUMP GRAFT   REVISON OF ARTERIOVENOUS FISTULA Left 09/01/2023   Procedure: REVISON OF ARTERIOVENOUS FISTULA; INSERTION OF TEMPORARY HEMODIALYSIS CATHETER IN LEFT GROIN;  Surgeon: Marea Selinda RAMAN, MD;  Location: [facility];  Service: Vascular;  Laterality: Left;  excision of AV fistula graft   TEE WITHOUT CARDIOVERSION N/A 09/03/2023   Procedure: ECHOCARDIOGRAM, TRANSESOPHAGEAL;  Surgeon: Perla Evalene PARAS, MD;  Location: [facility];  Service: Cardiovascular;  Laterality: N/A;   WOUND DEBRIDEMENT Left    WOUND DEBRIDEMENT Right 01/16/2022   Procedure: DEBRIDEMENT WOUND;  Surgeon: Jama Cordella MATSU, MD;  Location: [facility];  Service: Vascular;  Laterality: Right;  Wound vac placement   WOUND DEBRIDEMENT Right 03/07/2022   Procedure: DEBRIDEMENT WOUND RIGHT LOWER EXTREMITY;  Surgeon: Tisa Curry LABOR, MD;  Location: [facility];  Service: Vascular;  Laterality: Right;    Social History:  reports that he has never smoked. He has never used smokeless tobacco. He reports that he does not currently use alcohol. He reports that he does not use drugs.  Family History:  Family History  Problem  Relation Age of Onset   Heart failure Mother    Cirrhosis Father    Hypertension Daughter      Prior to Admission medications   Medication Sig Start Date End Date Taking? Authorizing Provider  acetaminophen  (TYLENOL ) 325 MG tablet Take 2 tablets (650 mg total) by mouth every 6 (six) hours as needed for mild pain (pain score 1-3), fever or headache (or Fever >/= 101). 03/04/23   Von Bellis, MD  amiodarone  (PACERONE ) 200 MG tablet Take 1 tablet (200 mg total) by mouth 2 (two) times daily. 09/20/23   Cannady, Jolene T, NP  aspirin  EC 81 MG tablet Take 1 tablet (81 mg total) by mouth daily. Swallow whole. 03/05/23   Von Bellis, MD  carvedilol  (COREG ) 6.25 MG tablet Take 1 tablet (6.25 mg total) by mouth daily. 09/20/23   Cannady, Jolene T, NP  ELIQUIS  2.5 MG TABS tablet Take 1 tablet (2.5 mg total) by mouth 2 (two) times daily. 09/20/23   Cannady, Jolene T, NP  iron  polysaccharides (NIFEREX) 150 MG capsule Take 1 capsule (150 mg total) by mouth daily. 03/05/23   Von Bellis, MD  midodrine  (PROAMATINE ) 5 MG tablet Take 5 mg by mouth as needed (post dialysis hypotension SBP<90 or  DBP<55).    [provider]  ondansetron  (ZOFRAN ) 4 MG tablet Take 4 mg by mouth every 6 (six) hours as needed for nausea or vomiting. 08/01/23   [provider]  oxyCODONE -acetaminophen  (PERCOCET/ROXICET) 5-325 MG tablet Take 1-2 tablets by mouth daily as needed for moderate pain (pain score 4-6). To use prior to dialysis only. Patient not taking: Reported on 10/07/2023 09/16/23   Cannady, Jolene T, NP  rosuvastatin  (CRESTOR ) 40 MG tablet Take 1 tablet (40 mg total) by mouth daily. 09/20/23   Cannady, Jolene T, NP  sevelamer  carbonate (RENVELA ) 800 MG tablet Take 2,400 mg by mouth 3 (three) times daily. 09/21/22   [provider]  silver  sulfADIAZINE  (SILVADENE ) 1 % cream Apply 1 Application topically daily. 09/28/23   Cannady, Jolene T, NP  zolpidem  (AMBIEN ) 10 MG tablet Take 1 tablet (10 mg total) by  mouth at bedtime as needed for sleep. 09/16/23   Valerio Moris T, NP    Physical Exam: Vitals:   10/25/23 1940 10/25/23 1945 10/25/23 2340 10/26/23 0114  BP:  (!) 130/59  137/79  Pulse:  74  72  Resp:  (!) 30  20  Temp: 98.6 F (37 C)  99.6 F (37.6 C) 98.6 F (37 C)  TempSrc: Oral  Oral   SpO2:  100%  99%  Weight:    109.5 kg  Height:       General: Not in acute distress HEENT:       Eyes: PERRL, EOMI, no jaundice       ENT: No discharge from the ears and nose, no pharynx injection, no tonsillar enlargement.        Neck: No JVD, no bruit, no mass felt. Heme: No neck lymph node enlargement. Cardiac: S1/S2, irregularly irregular rhythm, no murmurs, No gallops or rubs. Respiratory: Has fine crackles bilaterally. GI: Soft, nondistended, nontender, no rebound pain, no organomegaly, BS present. GU: No hematuria Ext: S/p of right AKA.  Has 1+ left leg edema Musculoskeletal: No joint deformities, No joint redness or warmth, no limitation of ROM in spin. Skin: No rashes.  Neuro: Alert, oriented X3, cranial nerves II-XII grossly intact, moves all extremities normally.  Psych: Patient is not psychotic, no suicidal or hemocidal ideation.  Labs on Admission: I have personally reviewed following labs and imaging studies  CBC: Recent Labs  Lab 10/25/23 1745  WBC 7.9  NEUTROABS 6.7  HGB 9.1*  HCT 29.6*  MCV 85.8  PLT 170   Basic Metabolic Panel: Recent Labs  Lab 10/25/23 1745  NA 140  K 3.6  CL 95*  CO2 30  GLUCOSE 87  BUN 26*  CREATININE 5.39*  CALCIUM  8.8*   GFR: Estimated Creatinine Clearance: 19 mL/min (A) (by C-G formula based on SCr of 5.39 mg/dL (H)). Liver Function Tests: Recent Labs  Lab 10/25/23 1745  AST 41  ALT 29  ALKPHOS 81  BILITOT 1.2  PROT 8.4*  ALBUMIN  3.4*   No results for input(s): LIPASE, AMYLASE in the last 168 hours. No results for input(s): AMMONIA in the last 168 hours. Coagulation Profile: No results for input(s): INR,  PROTIME in the last 168 hours. Cardiac Enzymes: No results for input(s): CKTOTAL, CKMB, CKMBINDEX, TROPONINI in the last 168 hours. BNP (last 3 results) No results for input(s): PROBNP in the last 8760 hours. HbA1C: No results for input(s): HGBA1C in the last 72 hours. CBG: No results for input(s): GLUCAP in the last 168 hours. Lipid Profile: No results for input(s): CHOL, HDL,  LDLCALC, TRIG, CHOLHDL, LDLDIRECT in the last 72 hours. Thyroid  Function Tests: No results for input(s): TSH, T4TOTAL, FREET4, T3FREE, THYROIDAB in the last 72 hours. Anemia Panel: No results for input(s): VITAMINB12, FOLATE, FERRITIN, TIBC, IRON , RETICCTPCT in the last 72 hours. Urine analysis: No results found for: COLORURINE, APPEARANCEUR, LABSPEC, PHURINE, GLUCOSEU, HGBUR, BILIRUBINUR, KETONESUR, PROTEINUR, UROBILINOGEN, NITRITE, LEUKOCYTESUR Sepsis Labs: @LABRCNTIP (procalcitonin:4,lacticidven:4) ) Recent Results (from the past 240 hours)  Resp panel by RT-PCR (RSV, Flu A&B, Covid) Anterior Nasal Swab     Status: None   Collection Time: 10/25/23 11:36 PM   Specimen: Anterior Nasal Swab  Result Value Ref Range Status   SARS Coronavirus 2 by RT PCR NEGATIVE NEGATIVE Final    Comment: (NOTE) SARS-CoV-2 target nucleic acids are NOT DETECTED.  The SARS-CoV-2 RNA is generally detectable in upper respiratory specimens during the acute phase of infection. The lowest concentration of SARS-CoV-2 viral copies this assay can detect is 138 copies/mL. A negative result does not preclude SARS-Cov-2 infection and should not be used as the sole basis for treatment or other patient management decisions. A negative result may occur with  improper specimen collection/handling, submission of specimen other than nasopharyngeal swab, presence of viral mutation(s) within the areas targeted by this assay, and inadequate number of viral copies(<138  copies/mL). A negative result must be combined with clinical observations, patient history, and epidemiological information. The expected result is Negative.  Fact Sheet for Patients:  BloggerCourse.com  Fact Sheet for Healthcare Providers:  SeriousBroker.it  This test is no t yet approved or cleared by the [place]  FDA and  has been authorized for detection and/or diagnosis of SARS-CoV-2 by FDA under an Emergency Use Authorization (EUA). This EUA will remain  in effect (meaning this test can be used) for the duration of the COVID-19 declaration under Section 564(b)(1) of the Act, 21 U.S.C.section 360bbb-3(b)(1), unless the authorization is terminated  or revoked sooner.       Influenza A by PCR NEGATIVE NEGATIVE Final   Influenza B by PCR NEGATIVE NEGATIVE Final    Comment: (NOTE) The Xpert Xpress SARS-CoV-2/FLU/RSV plus assay is intended as an aid in the diagnosis of influenza from Nasopharyngeal swab specimens and should not be used as a sole basis for treatment. Nasal washings and aspirates are unacceptable for Xpert Xpress SARS-CoV-2/FLU/RSV testing.  Fact Sheet for Patients: BloggerCourse.com  Fact Sheet for Healthcare Providers: SeriousBroker.it  This test is not yet approved or cleared by the [place]  FDA and has been authorized for detection and/or diagnosis of SARS-CoV-2 by FDA under an Emergency Use Authorization (EUA). This EUA will remain in effect (meaning this test can be used) for the duration of the COVID-19 declaration under Section 564(b)(1) of the Act, 21 U.S.C. section 360bbb-3(b)(1), unless the authorization is terminated or revoked.     Resp Syncytial Virus by PCR NEGATIVE NEGATIVE Final    Comment: (NOTE) Fact Sheet for Patients: BloggerCourse.com  Fact Sheet for Healthcare  Providers: SeriousBroker.it  This test is not yet approved or cleared by the [place]  FDA and has been authorized for detection and/or diagnosis of SARS-CoV-2 by FDA under an Emergency Use Authorization (EUA). This EUA will remain in effect (meaning this test can be used) for the duration of the COVID-19 declaration under Section 564(b)(1) of the Act, 21 U.S.C. section 360bbb-3(b)(1), unless the authorization is terminated or revoked.  Performed at [facility], [address] [place], [place] [address]      Radiological Exams on Admission:  Assessment/Plan Principal Problem:   HCAP (healthcare-associated pneumonia) Active Problems:   Fluid overload   Chronic diastolic CHF (congestive heart failure) (HCC)   CAD (coronary artery disease)   ESRD (end stage renal disease) on dialysis [facility])   Atrial fibrillation, chronic (HCC)   Hyperlipidemia associated with type 2 diabetes mellitus (HCC)   Anemia in ESRD (end-stage renal disease) (HCC)   Chronic pain syndrome   Obesity (BMI 30-39.9)   Assessment and Plan:  Possible HCAP (healthcare-associated pneumonia): Chest x-ray showed minimal patchy retrocardiac opacities. Pt does not have fever or leukocytosis, but his procalcitonin is elevated at 1.05,  will treat patient as possible HCAP.  - Will admit to tele bed as inpt -  IV Vancomycin  and cefepime  (patient also received 1 dose of azithromycin  in ED) - Mucinex  for cough  - Bronchodilators - Urine legionella and S. pneumococcal antigen - Follow up blood culture x2, sputum culture  Fluid overload and Chronic diastolic CHF (congestive heart failure) [facility]): Patient is 1+ left leg edema, indicating some fluid overload.  BNP 1182 which is not aggravated due to ESRD.  2D echo on 09/11/2023 showed EF of 55 to 60%. - Consulted Dr. Marcelino of renal renal for dialysis.  CAD (coronary artery disease): - Crestor  - Patient is on Eliquis   ESRD (end  stage renal disease) on dialysis (MWF) -Consulted Dr. Marcelino of renal for dialysis  Atrial fibrillation, chronic [facility]): Currently heart rate 70s - Coreg , amiodarone  - Eliquis   Hyperlipidemia associated with type 2 diabetes mellitus (HCC) -Crestor   Anemia in ESRD (end-stage renal disease): Hemoglobin stable, 9.1 (8.4 on 10/07/2023) -Follow-up with CBC  Chronic pain syndrome -As needed Percocet and Tylenol   Obesity: Patient has Obesity Class I, with body weight 109.5 Kg and BMI 30.17 kg/m2.  - Encourage losing weight - Exercise and healthy diet    DVT ppx: on Eliquis   Code Status: Full code    Family Communication:     not done, no family member is at bed side.      Disposition Plan:  Anticipate discharge back to previous environment  Consults called:  Dr. Marcelino of renal  Admission status and Level of care: Telemetry Medical:  as inpt        Dispo: The patient is from: Home              Anticipated d/c is to: Home              Anticipated d/c date is: 2 days              Patient currently is not medically stable to d/c.    Severity of Illness:  The appropriate patient status for this patient is INPATIENT. Inpatient status is judged to be reasonable and necessary in order to provide the required intensity of service to ensure the patient's safety. The patient's presenting symptoms, physical exam findings, and initial radiographic and laboratory data in the context of their chronic comorbidities is felt to place them at high risk for further clinical deterioration. Furthermore, it is not anticipated that the patient will be medically stable for discharge from the hospital within 2 midnights of admission.   * I certify that at the point of admission it is my clinical judgment that the patient will require inpatient hospital care spanning beyond 2 midnights from the point of admission due to high intensity of service, high risk for further deterioration and high frequency of  surveillance required.*       Date of  Service 10/26/2023    Caleb Exon [facility]   If 7PM-7AM, please contact night-coverage [email or web address] 10/26/2023, 2:09 AM

## 2023-10-25 NOTE — ED Notes (Signed)
 Assumed care of pt at this time. 2 unsuccessful IV starts by this RN and 1 unsuccessful start by previous RN. Will order IV team consult

## 2023-10-25 NOTE — ED Provider Triage Note (Signed)
 Emergency Medicine Provider Triage Evaluation Note  Adam Howell , a 63 y.o. male  was evaluated in triage.  Pt complains of productive cough, chest pain, shortness of breath, wheezing.  Patient denies fever.  Patient was seen last week for pneumonia.  Patient has history of cardiac heart failure, patient is using oxygen 3 L  Review of Systems  Positive:  Negative:   Physical Exam  BP (!) 145/62   Pulse 66   Temp 98.9 F (37.2 C)   Resp 20   Ht 6' 3 (1.905 m)   Wt 103.4 kg   SpO2 100%   BMI 28.49 kg/m in triage patient was hypertensive, saturations 100% with 3 L of oxygen Gen:   Awake, no distress  Resp:  Normal effort bilateral wheezing MSK:   Moves extremities without difficulty  Other:    Medical Decision Making  Medically screening exam initiated at 3:26 PM.  Appropriate orders placed.  Adam Howell was informed that the remainder of the evaluation will be completed by another provider, this initial triage assessment does not replace that evaluation, and the importance of remaining in the ED until their evaluation is complete.  Patient who presents today with history of productive cough, wheezing, shortness of breath, chest pain.  Patient was seen last week for pneumonia.  Has history of cardiac heart failure.  Ordered CBC CMP BMP UA chest x-ray EKG   Adam Kast, PA-C 10/25/23 1527

## 2023-10-25 NOTE — H&P (Signed)
 History and Physical    Adam Howell FMW:[id number] DOB: March 05, 1961 DOA: 10/25/2023  Referring MD/NP/PA:   PCP: Valerio Melanie DASEN, NP   Patient coming from:  The patient is coming from home.     Chief Complaint: SOB  HPI: Adam Howell is a 63 y.o. male with medical history significant of  ESRD-HD (MWF), HTN, HLD, diet-controlled DM, CAD, dCHF, A fib on Eliquis , amenia, chronic pain, s/p of right AKA, who presents with SOB.  Patient states that he has a dry cough, SOB in the past 2 days, which has been progressively worsening.  He reports possible wheezing, but by my examination of lung auscultation, patient does not have wheezing.  No fever or chills.  No nausea, vomiting, diarrhea or abdominal cramping no symptoms UTI.  Patient had dialysis on Monday.  He is taking his Eliquis  consistently.  Patient does not use oxygen at baseline, but today he has 3 L new oxygen requirement with 100% saturation.   Data reviewed independently and ED Course: pt was found to have WBC 7.9, procalcitonin 1.05, negative PCR for COVID, flu and RSV, temperature normal, blood pressure 130/59, heart rate 74, RR 30.  Patient is admitted to telemetry bed as inpatient.   Chest x-ray: 1. Cardiomegaly with small bilateral pleural effusions. 2. Minimal patchy retrocardiac opacities, atelectasis versus infiltrate.   EKG: I have personally reviewed.  A-fib, QTc 497, LAD, left bundle blockade, poor R wave progression.   Review of Systems:   General: no fevers, chills, no body weight gain, has fatigue HEENT: no blurry vision, hearing changes or sore throat Respiratory: Has dyspnea, coughing, no wheezing CV: no chest pain, no palpitations GI: no nausea, vomiting, abdominal pain, diarrhea, constipation GU: no dysuria, burning on urination, increased urinary frequency, hematuria  Ext: Has leg edema Neuro: no unilateral weakness, numbness, or tingling, no vision change or hearing loss Skin: no rash, no  skin tear. MSK: No muscle spasm, no deformity, no limitation of range of movement in spin Heme: No easy bruising.  Travel history: No recent long distant travel.   Allergy:  Allergies  Allergen Reactions   Ivp Dye [Iodinated Contrast Media] Anaphylaxis   Tramadol  Rash    Past Medical History:  Diagnosis Date   Anemia in chronic kidney disease    Cellulitis of left lower extremity    Chronic atrial fibrillation (HCC)    Chronic kidney disease    Congestive heart failure (HCC)    Difficult intubation    End-stage renal disease on hemodialysis (HCC)    Hyperlipidemia    Hypertension    Type 2 diabetes mellitus with complication [facility])     Past Surgical History:  Procedure Laterality Date   A/V FISTULAGRAM Left 08/27/2022   Procedure: A/V Fistulagram;  Surgeon: Marea Selinda RAMAN, MD;  Location: [facility];  Service: Cardiovascular;  Laterality: Left;   A/V FISTULAGRAM Left 08/12/2023   Procedure: A/V Fistulagram;  Surgeon: Marea Selinda RAMAN, MD;  Location: [facility];  Service: Cardiovascular;  Laterality: Left;   AMPUTATION Right 03/13/2022   Procedure: AMPUTATION ABOVE KNEE;  Surgeon: Marea Selinda RAMAN, MD;  Location: [facility];  Service: General;  Laterality: Right;   APPLICATION OF WOUND VAC Right 03/07/2022   Procedure: APPLICATION OF WOUND VAC;  Surgeon: Tisa Curry LABOR, MD;  Location: [facility];  Service: Vascular;  Laterality: Right;  GAAC [id number]   CENTRAL LINE INSERTION Left 08/05/2023   Procedure: CENTRAL LINE INSERTION;  Surgeon: Marea Selinda RAMAN,  MD;  Location: [facility];  Service: Vascular;  Laterality: Left;   DIALYSIS/PERMA CATHETER INSERTION Right 08/05/2023   Procedure: DIALYSIS/PERMA CATHETER INSERTION;  Surgeon: Marea Selinda RAMAN, MD;  Location: [facility];  Service: Vascular;  Laterality: Right;   DIALYSIS/PERMA CATHETER INSERTION N/A 09/06/2023   Procedure: DIALYSIS/PERMA CATHETER INSERTION;  Surgeon: Marea Selinda RAMAN, MD;  Location: [facility];  Service:  Cardiovascular;  Laterality: N/A;   DIALYSIS/PERMA CATHETER REMOVAL Right 08/31/2023   Procedure: DIALYSIS/PERMA CATHETER REMOVAL;  Surgeon: Jama Cordella MATSU, MD;  Location: [facility];  Service: Cardiovascular;  Laterality: Right;   GROIN DISSECTION Right 08/05/2023   Procedure: right groin venogram, angioplasty of superior venacava and anomic vein;  Surgeon: Marea Selinda RAMAN, MD;  Location: [facility];  Service: Vascular;  Laterality: Right;   LOWER EXTREMITY ANGIOGRAPHY Left 02/15/2023   Procedure: Lower Extremity Angiography;  Surgeon: Marea Selinda RAMAN, MD;  Location: [facility];  Service: Cardiovascular;  Laterality: Left;   REVISON OF ARTERIOVENOUS FISTULA Left 08/05/2023   Procedure: REVISON OF ARTERIOVENOUS FISTULA;  Surgeon: Marea Selinda RAMAN, MD;  Location: [facility];  Service: Vascular;  Laterality: Left;  JUMP GRAFT   REVISON OF ARTERIOVENOUS FISTULA Left 09/01/2023   Procedure: REVISON OF ARTERIOVENOUS FISTULA; INSERTION OF TEMPORARY HEMODIALYSIS CATHETER IN LEFT GROIN;  Surgeon: Marea Selinda RAMAN, MD;  Location: [facility];  Service: Vascular;  Laterality: Left;  excision of AV fistula graft   TEE WITHOUT CARDIOVERSION N/A 09/03/2023   Procedure: ECHOCARDIOGRAM, TRANSESOPHAGEAL;  Surgeon: Perla Evalene PARAS, MD;  Location: [facility];  Service: Cardiovascular;  Laterality: N/A;   WOUND DEBRIDEMENT Left    WOUND DEBRIDEMENT Right 01/16/2022   Procedure: DEBRIDEMENT WOUND;  Surgeon: Jama Cordella MATSU, MD;  Location: [facility];  Service: Vascular;  Laterality: Right;  Wound vac placement   WOUND DEBRIDEMENT Right 03/07/2022   Procedure: DEBRIDEMENT WOUND RIGHT LOWER EXTREMITY;  Surgeon: Tisa Curry LABOR, MD;  Location: [facility];  Service: Vascular;  Laterality: Right;    Social History:  reports that he has never smoked. He has never used smokeless tobacco. He reports that he does not currently use alcohol. He reports that he does not use drugs.  Family History:  Family History  Problem  Relation Age of Onset   Heart failure Mother    Cirrhosis Father    Hypertension Daughter      Prior to Admission medications   Medication Sig Start Date End Date Taking? Authorizing Provider  acetaminophen  (TYLENOL ) 325 MG tablet Take 2 tablets (650 mg total) by mouth every 6 (six) hours as needed for mild pain (pain score 1-3), fever or headache (or Fever >/= 101). 03/04/23   Von Bellis, MD  amiodarone  (PACERONE ) 200 MG tablet Take 1 tablet (200 mg total) by mouth 2 (two) times daily. 09/20/23   Cannady, Jolene T, NP  aspirin  EC 81 MG tablet Take 1 tablet (81 mg total) by mouth daily. Swallow whole. 03/05/23   Von Bellis, MD  carvedilol  (COREG ) 6.25 MG tablet Take 1 tablet (6.25 mg total) by mouth daily. 09/20/23   Cannady, Jolene T, NP  ELIQUIS  2.5 MG TABS tablet Take 1 tablet (2.5 mg total) by mouth 2 (two) times daily. 09/20/23   Cannady, Jolene T, NP  iron  polysaccharides (NIFEREX) 150 MG capsule Take 1 capsule (150 mg total) by mouth daily. 03/05/23   Von Bellis, MD  midodrine  (PROAMATINE ) 5 MG tablet Take 5 mg by mouth as needed (post dialysis hypotension SBP<90 or  DBP<55).    [provider]  ondansetron  (ZOFRAN ) 4 MG tablet Take 4 mg by mouth every 6 (six) hours as needed for nausea or vomiting. 08/01/23   [provider]  oxyCODONE -acetaminophen  (PERCOCET/ROXICET) 5-325 MG tablet Take 1-2 tablets by mouth daily as needed for moderate pain (pain score 4-6). To use prior to dialysis only. Patient not taking: Reported on 10/07/2023 09/16/23   Cannady, Jolene T, NP  rosuvastatin  (CRESTOR ) 40 MG tablet Take 1 tablet (40 mg total) by mouth daily. 09/20/23   Cannady, Jolene T, NP  sevelamer  carbonate (RENVELA ) 800 MG tablet Take 2,400 mg by mouth 3 (three) times daily. 09/21/22   [provider]  silver  sulfADIAZINE  (SILVADENE ) 1 % cream Apply 1 Application topically daily. 09/28/23   Cannady, Jolene T, NP  zolpidem  (AMBIEN ) 10 MG tablet Take 1 tablet (10 mg total) by  mouth at bedtime as needed for sleep. 09/16/23   Valerio Moris T, NP    Physical Exam: Vitals:   10/25/23 1940 10/25/23 1945 10/25/23 2340 10/26/23 0114  BP:  (!) 130/59  137/79  Pulse:  74  72  Resp:  (!) 30  20  Temp: 98.6 F (37 C)  99.6 F (37.6 C) 98.6 F (37 C)  TempSrc: Oral  Oral   SpO2:  100%  99%  Weight:    109.5 kg  Height:       General: Not in acute distress HEENT:       Eyes: PERRL, EOMI, no jaundice       ENT: No discharge from the ears and nose, no pharynx injection, no tonsillar enlargement.        Neck: No JVD, no bruit, no mass felt. Heme: No neck lymph node enlargement. Cardiac: S1/S2, irregularly irregular rhythm, no murmurs, No gallops or rubs. Respiratory: Has fine crackles bilaterally. GI: Soft, nondistended, nontender, no rebound pain, no organomegaly, BS present. GU: No hematuria Ext: S/p of right AKA.  Has 1+ left leg edema Musculoskeletal: No joint deformities, No joint redness or warmth, no limitation of ROM in spin. Skin: No rashes.  Neuro: Alert, oriented X3, cranial nerves II-XII grossly intact, moves all extremities normally.  Psych: Patient is not psychotic, no suicidal or hemocidal ideation.  Labs on Admission: I have personally reviewed following labs and imaging studies  CBC: Recent Labs  Lab 10/25/23 1745  WBC 7.9  NEUTROABS 6.7  HGB 9.1*  HCT 29.6*  MCV 85.8  PLT 170   Basic Metabolic Panel: Recent Labs  Lab 10/25/23 1745  NA 140  K 3.6  CL 95*  CO2 30  GLUCOSE 87  BUN 26*  CREATININE 5.39*  CALCIUM  8.8*   GFR: Estimated Creatinine Clearance: 19 mL/min (A) (by C-G formula based on SCr of 5.39 mg/dL (H)). Liver Function Tests: Recent Labs  Lab 10/25/23 1745  AST 41  ALT 29  ALKPHOS 81  BILITOT 1.2  PROT 8.4*  ALBUMIN  3.4*   No results for input(s): LIPASE, AMYLASE in the last 168 hours. No results for input(s): AMMONIA in the last 168 hours. Coagulation Profile: No results for input(s): INR,  PROTIME in the last 168 hours. Cardiac Enzymes: No results for input(s): CKTOTAL, CKMB, CKMBINDEX, TROPONINI in the last 168 hours. BNP (last 3 results) No results for input(s): PROBNP in the last 8760 hours. HbA1C: No results for input(s): HGBA1C in the last 72 hours. CBG: No results for input(s): GLUCAP in the last 168 hours. Lipid Profile: No results for input(s): CHOL, HDL,  LDLCALC, TRIG, CHOLHDL, LDLDIRECT in the last 72 hours. Thyroid  Function Tests: No results for input(s): TSH, T4TOTAL, FREET4, T3FREE, THYROIDAB in the last 72 hours. Anemia Panel: No results for input(s): VITAMINB12, FOLATE, FERRITIN, TIBC, IRON , RETICCTPCT in the last 72 hours. Urine analysis: No results found for: COLORURINE, APPEARANCEUR, LABSPEC, PHURINE, GLUCOSEU, HGBUR, BILIRUBINUR, KETONESUR, PROTEINUR, UROBILINOGEN, NITRITE, LEUKOCYTESUR Sepsis Labs: @LABRCNTIP (procalcitonin:4,lacticidven:4) ) Recent Results (from the past 240 hours)  Resp panel by RT-PCR (RSV, Flu A&B, Covid) Anterior Nasal Swab     Status: None   Collection Time: 10/25/23 11:36 PM   Specimen: Anterior Nasal Swab  Result Value Ref Range Status   SARS Coronavirus 2 by RT PCR NEGATIVE NEGATIVE Final    Comment: (NOTE) SARS-CoV-2 target nucleic acids are NOT DETECTED.  The SARS-CoV-2 RNA is generally detectable in upper respiratory specimens during the acute phase of infection. The lowest concentration of SARS-CoV-2 viral copies this assay can detect is 138 copies/mL. A negative result does not preclude SARS-Cov-2 infection and should not be used as the sole basis for treatment or other patient management decisions. A negative result may occur with  improper specimen collection/handling, submission of specimen other than nasopharyngeal swab, presence of viral mutation(s) within the areas targeted by this assay, and inadequate number of viral copies(<138  copies/mL). A negative result must be combined with clinical observations, patient history, and epidemiological information. The expected result is Negative.  Fact Sheet for Patients:  BloggerCourse.com  Fact Sheet for Healthcare Providers:  SeriousBroker.it  This test is no t yet approved or cleared by the [place]  FDA and  has been authorized for detection and/or diagnosis of SARS-CoV-2 by FDA under an Emergency Use Authorization (EUA). This EUA will remain  in effect (meaning this test can be used) for the duration of the COVID-19 declaration under Section 564(b)(1) of the Act, 21 U.S.C.section 360bbb-3(b)(1), unless the authorization is terminated  or revoked sooner.       Influenza A by PCR NEGATIVE NEGATIVE Final   Influenza B by PCR NEGATIVE NEGATIVE Final    Comment: (NOTE) The Xpert Xpress SARS-CoV-2/FLU/RSV plus assay is intended as an aid in the diagnosis of influenza from Nasopharyngeal swab specimens and should not be used as a sole basis for treatment. Nasal washings and aspirates are unacceptable for Xpert Xpress SARS-CoV-2/FLU/RSV testing.  Fact Sheet for Patients: BloggerCourse.com  Fact Sheet for Healthcare Providers: SeriousBroker.it  This test is not yet approved or cleared by the [place]  FDA and has been authorized for detection and/or diagnosis of SARS-CoV-2 by FDA under an Emergency Use Authorization (EUA). This EUA will remain in effect (meaning this test can be used) for the duration of the COVID-19 declaration under Section 564(b)(1) of the Act, 21 U.S.C. section 360bbb-3(b)(1), unless the authorization is terminated or revoked.     Resp Syncytial Virus by PCR NEGATIVE NEGATIVE Final    Comment: (NOTE) Fact Sheet for Patients: BloggerCourse.com  Fact Sheet for Healthcare  Providers: SeriousBroker.it  This test is not yet approved or cleared by the [place]  FDA and has been authorized for detection and/or diagnosis of SARS-CoV-2 by FDA under an Emergency Use Authorization (EUA). This EUA will remain in effect (meaning this test can be used) for the duration of the COVID-19 declaration under Section 564(b)(1) of the Act, 21 U.S.C. section 360bbb-3(b)(1), unless the authorization is terminated or revoked.  Performed at [facility], [address] [place], [place] [address]      Radiological Exams on Admission:  Assessment/Plan Principal Problem:   HCAP (healthcare-associated pneumonia) Active Problems:   Fluid overload   Chronic diastolic CHF (congestive heart failure) (HCC)   CAD (coronary artery disease)   ESRD (end stage renal disease) on dialysis [facility])   Atrial fibrillation, chronic (HCC)   Hyperlipidemia associated with type 2 diabetes mellitus (HCC)   Anemia in ESRD (end-stage renal disease) (HCC)   Chronic pain syndrome   Obesity (BMI 30-39.9)   Assessment and Plan:  Possible HCAP (healthcare-associated pneumonia): Chest x-ray showed minimal patchy retrocardiac opacities. Pt does not have fever or leukocytosis, but his procalcitonin is elevated at 1.05,  will treat patient as possible HCAP.  - Will admit to tele bed as inpt -  IV Vancomycin  and cefepime  (patient also received 1 dose of azithromycin  in ED) - Mucinex  for cough  - Bronchodilators - Urine legionella and S. pneumococcal antigen - Follow up blood culture x2, sputum culture  Fluid overload and Chronic diastolic CHF (congestive heart failure) [facility]): Patient is 1+ left leg edema, indicating some fluid overload.  BNP 1182 which is not aggravated due to ESRD.  2D echo on 09/11/2023 showed EF of 55 to 60%. - Consulted Dr. Marcelino of renal renal for dialysis.  CAD (coronary artery disease): - Crestor  - Patient is on Eliquis   ESRD (end  stage renal disease) on dialysis (MWF) -Consulted Dr. Marcelino of renal for dialysis  Atrial fibrillation, chronic [facility]): Currently heart rate 70s - Coreg , amiodarone  - Eliquis   Hyperlipidemia associated with type 2 diabetes mellitus (HCC) -Crestor   Anemia in ESRD (end-stage renal disease): Hemoglobin stable, 9.1 (8.4 on 10/07/2023) -Follow-up with CBC  Chronic pain syndrome -As needed Percocet and Tylenol   Obesity: Patient has Obesity Class I, with body weight 109.5 Kg and BMI 30.17 kg/m2.  - Encourage losing weight - Exercise and healthy diet    DVT ppx: on Eliquis   Code Status: Full code    Family Communication:     not done, no family member is at bed side.      Disposition Plan:  Anticipate discharge back to previous environment  Consults called:  Dr. Marcelino of renal  Admission status and Level of care: Telemetry Medical:  as inpt        Dispo: The patient is from: Home              Anticipated d/c is to: Home              Anticipated d/c date is: 2 days              Patient currently is not medically stable to d/c.    Severity of Illness:  The appropriate patient status for this patient is INPATIENT. Inpatient status is judged to be reasonable and necessary in order to provide the required intensity of service to ensure the patient's safety. The patient's presenting symptoms, physical exam findings, and initial radiographic and laboratory data in the context of their chronic comorbidities is felt to place them at high risk for further clinical deterioration. Furthermore, it is not anticipated that the patient will be medically stable for discharge from the hospital within 2 midnights of admission.   * I certify that at the point of admission it is my clinical judgment that the patient will require inpatient hospital care spanning beyond 2 midnights from the point of admission due to high intensity of service, high risk for further deterioration and high frequency of  surveillance required.*       Date of  Service 10/26/2023    Caleb Exon [facility]   If 7PM-7AM, please contact night-coverage [email or web address] 10/26/2023, 2:09 AM

## 2023-10-25 NOTE — ED Notes (Signed)
 Pt comes via EMS from home  with sob. Pt was on 15L by fire and at 100%. Pt normally wear 2L but hasn't been wearing it . Pt was 87 % RA with fire. Pt does have cough. Pt now at 3L at 99% .

## 2023-10-25 NOTE — ED Notes (Signed)
 Acuity changed due to pt lab value

## 2023-10-25 NOTE — ED Notes (Signed)
 Fall risk bundle in place.

## 2023-10-25 NOTE — ED Provider Notes (Signed)
 [facility] Provider Note    Event Date/Time   First MD Initiated Contact with Patient 10/25/23 1909     (approximate)   History   Shortness of Breath   HPI  Adam Howell is a 63 year old male with history of ESRD on HD presenting to the emergency department for evaluation of shortness of breath.  Patient reports that he was discharged from the hospital a few weeks ago after being treated for pneumonia.  He temporarily felt improved, but over the past week has had recurrent symptoms including  worsening cough, shortness of breath.  Denies chest pain.  Orts he has been attending his dialysis as directed.  Completed his last session without issue.  Feels wheezy, but denies history of asthma or COPD.     Physical Exam   Triage Vital Signs: ED Triage Vitals  Encounter Vitals Group     BP 10/25/23 1523 (!) 145/62     Girls Systolic BP Percentile --      Girls Diastolic BP Percentile --      Boys Systolic BP Percentile --      Boys Diastolic BP Percentile --      Pulse Rate 10/25/23 1523 66     Resp 10/25/23 1523 20     Temp 10/25/23 1523 98.9 F (37.2 C)     Temp Source 10/25/23 1940 Oral     SpO2 10/25/23 1523 100 %     Weight 10/25/23 1525 227 lb 15.3 oz (103.4 kg)     Height 10/25/23 1525 6' 3 (1.905 m)     Head Circumference --      Peak Flow --      Pain Score 10/25/23 1524 7     Pain Loc --      Pain Education --      Exclude from Growth Chart --     Most recent vital signs: Vitals:   10/25/23 1940 10/25/23 1945  BP:  (!) 130/59  Pulse:  74  Resp:  (!) 30  Temp: 98.6 F (37 C)   SpO2:  100%     General: Awake, interactive  CV:  Regular rate, good peripheral perfusion.  Resp:  Tachypneic with mildly labored respirations, lung sounds coarse bilaterally Abd:  Nondistended.  Neuro:  Symmetric facial movement, fluid speech   ED Results / Procedures / Treatments   Labs (all labs ordered are listed, but only abnormal  results are displayed) Labs Reviewed  BRAIN NATRIURETIC PEPTIDE - Abnormal; Notable for the following components:      Result Value   B Natriuretic Peptide 1,182.8 (*)    All other components within normal limits  COMPREHENSIVE METABOLIC PANEL WITH GFR - Abnormal; Notable for the following components:   Chloride 95 (*)    BUN 26 (*)    Creatinine, Ser 5.39 (*)    Calcium  8.8 (*)    Total Protein 8.4 (*)    Albumin  3.4 (*)    GFR, Estimated 11 (*)    All other components within normal limits  CBC WITH DIFFERENTIAL/PLATELET - Abnormal; Notable for the following components:   RBC 3.45 (*)    Hemoglobin 9.1 (*)    HCT 29.6 (*)    RDW 19.0 (*)    nRBC 0.3 (*)    Lymphs Abs 0.5 (*)    All other components within normal limits  CULTURE, BLOOD (ROUTINE X 2)  CULTURE, BLOOD (ROUTINE X 2)  RESP PANEL BY RT-PCR (RSV, FLU A&B,  COVID)  [id number]  PROCALCITONIN     EKG EKG independently reviewed and interpreted by myself demonstrates:  EKG demonstrates suspected atrial rhythm at  64, QRS 152, QTc 497, nonspecific ST changes noted, no STEMI  RADIOLOGY Imaging independently reviewed and interpreted by myself demonstrates:  CXR with bilateral pleural effusions, retrocardiac opacities noted concerning for possible infiltrate  Formal Radiology Read:  DG Chest Portable 1 View Result Date: 10/25/2023 CLINICAL DATA:  Shortness of breath EXAM: PORTABLE CHEST 1 VIEW COMPARISON:  Chest x-Katalina Magri 08/11/2023 FINDINGS: Left-sided central venous catheter tip projects over the SVC. The heart is enlarged. There are small bilateral pleural effusions. There are minimal patchy retrocardiac opacities. There is no pneumothorax or acute fracture. IMPRESSION: 1. Cardiomegaly with small bilateral pleural effusions. 2. Minimal patchy retrocardiac opacities, atelectasis versus infiltrate. Electronically Signed   By: Greig Pique M.D.   On: 10/25/2023 21:32    PROCEDURES:  Critical Care performed:  No  Procedures   MEDICATIONS ORDERED IN ED: Medications  ondansetron  (ZOFRAN ) injection 4 mg (has no administration in time range)  ipratropium-albuterol  (DUONEB) 0.5-2.5 (3) MG/3ML nebulizer solution 3 mL (has no administration in time range)  vancomycin  (VANCOCIN ) IVPB 1000 mg/200 mL premix (has no administration in time range)  ceFEPIme  (MAXIPIME ) 2 g in sodium chloride  0.9 % 100 mL IVPB (has no administration in time range)  azithromycin  (ZITHROMAX ) 500 mg in sodium chloride  0.9 % 250 mL IVPB (has no administration in time range)  albuterol  (PROVENTIL ) (2.5 MG/3ML) 0.083% nebulizer solution 2.5 mg (has no administration in time range)  dextromethorphan -guaiFENesin  (MUCINEX  DM) 30-600 MG per 12 hr tablet 1 tablet (has no administration in time range)  ondansetron  (ZOFRAN ) injection 4 mg (has no administration in time range)  hydrALAZINE  (APRESOLINE ) injection 5 mg (has no administration in time range)  acetaminophen  (TYLENOL ) tablet 650 mg (has no administration in time range)     IMPRESSION / MDM / ASSESSMENT AND PLAN / ED COURSE  I reviewed the triage vital signs and the nursing notes.  Differential diagnosis includes, but is not limited to, pneumonia, CHF exacerbation, volume overload, viral illness  Patient's presentation is most consistent with acute presentation with potential threat to life or bodily function.  63 year old male presenting to the emergency department for evaluation of shortness of breath and cough.  Hypoxic prehospital, placed on nasal cannula oxygen with improvement. Labs with normal white blood cell count, stable anemia, CMP with elevated BUN and creatinine consistent with known history of ESRD, reassuring potassium at 3.6.  BNP significantly elevated though more difficult to interpret in the setting of his ESRD.  Chest x-Sharanya Templin with questionable infiltrate.  With clinical signs of pneumonia, do think it is reasonable to treat him for this, particularly in light of  his new oxygen requirement.  Of note, during recent admission patient did have pneumonia not readily visible on x-Dawnyel Leven.  Fortunately does not meet SIRS criteria.  Ordered for antibiotics here.  Will reach out to hospitalist team.  Case discussed with Dr. Hilma.  He will evaluate for anticipated admission.      FINAL CLINICAL IMPRESSION(S) / ED DIAGNOSES   Final diagnoses:  Community acquired pneumonia, unspecified laterality  Hypoxia     Rx / DC Orders   ED Discharge Orders     None        Note:  This document was prepared using Dragon voice recognition software and may include unintentional dictation errors.   Levander Slate, MD 10/25/23 [phone]

## 2023-10-25 NOTE — ED Triage Notes (Signed)
 Pt comes in via [facility] with complaints of SOB and a cough that started about 3 days ago. Pt was recently seen and diagnosed with pneumonia.  Pt has no complaints of chest pain at this time. PT wheezing in triage. Pt placed on 3L of Oxygen [place] by EMS. Pt is alert and oriented x4.

## 2023-10-26 ENCOUNTER — Encounter: Payer: Self-pay | Admitting: Internal Medicine

## 2023-10-26 DIAGNOSIS — I700 Atherosclerosis of aorta: Secondary | ICD-10-CM | POA: Diagnosis not present

## 2023-10-26 DIAGNOSIS — E8770 Fluid overload, unspecified: Secondary | ICD-10-CM | POA: Diagnosis present

## 2023-10-26 DIAGNOSIS — I5032 Chronic diastolic (congestive) heart failure: Secondary | ICD-10-CM | POA: Diagnosis not present

## 2023-10-26 DIAGNOSIS — L97821 Non-pressure chronic ulcer of other part of left lower leg limited to breakdown of skin: Secondary | ICD-10-CM | POA: Diagnosis not present

## 2023-10-26 DIAGNOSIS — L97522 Non-pressure chronic ulcer of other part of left foot with fat layer exposed: Secondary | ICD-10-CM | POA: Diagnosis not present

## 2023-10-26 DIAGNOSIS — I482 Chronic atrial fibrillation, unspecified: Secondary | ICD-10-CM | POA: Diagnosis not present

## 2023-10-26 DIAGNOSIS — I132 Hypertensive heart and chronic kidney disease with heart failure and with stage 5 chronic kidney disease, or end stage renal disease: Secondary | ICD-10-CM | POA: Diagnosis not present

## 2023-10-26 DIAGNOSIS — N186 End stage renal disease: Secondary | ICD-10-CM | POA: Diagnosis not present

## 2023-10-26 DIAGNOSIS — E1169 Type 2 diabetes mellitus with other specified complication: Secondary | ICD-10-CM | POA: Diagnosis not present

## 2023-10-26 DIAGNOSIS — J189 Pneumonia, unspecified organism: Secondary | ICD-10-CM | POA: Diagnosis not present

## 2023-10-26 DIAGNOSIS — E785 Hyperlipidemia, unspecified: Secondary | ICD-10-CM | POA: Diagnosis not present

## 2023-10-26 DIAGNOSIS — Z992 Dependence on renal dialysis: Secondary | ICD-10-CM | POA: Diagnosis not present

## 2023-10-26 DIAGNOSIS — E11621 Type 2 diabetes mellitus with foot ulcer: Secondary | ICD-10-CM | POA: Diagnosis not present

## 2023-10-26 DIAGNOSIS — I87312 Chronic venous hypertension (idiopathic) with ulcer of left lower extremity: Secondary | ICD-10-CM | POA: Diagnosis not present

## 2023-10-26 LAB — CBC
HCT: 25.2 % — ABNORMAL LOW (ref 39.0–52.0)
Hemoglobin: 7.6 g/dL — ABNORMAL LOW (ref 13.0–17.0)
MCH: 26.1 pg (ref 26.0–34.0)
MCHC: 30.2 g/dL (ref 30.0–36.0)
MCV: 86.6 fL (ref 80.0–100.0)
Platelets: 132 K/uL — ABNORMAL LOW (ref 150–400)
RBC: 2.91 MIL/uL — ABNORMAL LOW (ref 4.22–5.81)
RDW: 18.8 % — ABNORMAL HIGH (ref 11.5–15.5)
WBC: 8.1 K/uL (ref 4.0–10.5)
nRBC: 0 % (ref 0.0–0.2)

## 2023-10-26 LAB — BASIC METABOLIC PANEL WITH GFR
Anion gap: 10 (ref 5–15)
BUN: 32 mg/dL — ABNORMAL HIGH (ref 8–23)
CO2: 30 mmol/L (ref 22–32)
Calcium: 8.2 mg/dL — ABNORMAL LOW (ref 8.9–10.3)
Chloride: 99 mmol/L (ref 98–111)
Creatinine, Ser: 6.14 mg/dL — ABNORMAL HIGH (ref 0.61–1.24)
GFR, Estimated: 10 mL/min — ABNORMAL LOW (ref >60)
Glucose, Bld: 96 mg/dL (ref 70–99)
Potassium: 3.8 mmol/L (ref 3.5–5.1)
Sodium: 139 mmol/L (ref 135–145)

## 2023-10-26 LAB — RESP PANEL BY RT-PCR (RSV, FLU A&B, COVID)  RVPGX2
Influenza A by PCR: NEGATIVE
Influenza B by PCR: NEGATIVE
Resp Syncytial Virus by PCR: NEGATIVE
SARS Coronavirus 2 by RT PCR: NEGATIVE

## 2023-10-26 LAB — GLUCOSE, CAPILLARY: Glucose-Capillary: 89 mg/dL (ref 70–99)

## 2023-10-26 LAB — MRSA NEXT GEN BY PCR, NASAL: MRSA by PCR Next Gen: DETECTED — AB

## 2023-10-26 MED ORDER — VANCOMYCIN HCL 1500 MG/300ML IV SOLN
1500.0000 mg | Freq: Once | INTRAVENOUS | Status: AC
  Administered 2023-10-26 (×2): 1500 mg via INTRAVENOUS
  Filled 2023-10-26: qty 300

## 2023-10-26 MED ORDER — MIDODRINE HCL 5 MG PO TABS
5.0000 mg | ORAL_TABLET | Freq: Once | ORAL | Status: AC | PRN
  Administered 2023-10-27 (×2): 5 mg via ORAL
  Filled 2023-10-26: qty 1

## 2023-10-26 MED ORDER — MEDIHONEY WOUND/BURN DRESSING EX PSTE
1.0000 | PASTE | Freq: Every day | CUTANEOUS | Status: DC
  Administered 2023-10-27 – 2023-11-01 (×7): 1 via TOPICAL
  Filled 2023-10-26 (×2): qty 44

## 2023-10-26 MED ORDER — ZOLPIDEM TARTRATE 5 MG PO TABS
10.0000 mg | ORAL_TABLET | Freq: Every evening | ORAL | Status: DC | PRN
  Administered 2023-10-27 – 2023-10-31 (×5): 10 mg via ORAL
  Filled 2023-10-26 (×4): qty 2

## 2023-10-26 MED ORDER — OXYCODONE-ACETAMINOPHEN 5-325 MG PO TABS
1.0000 | ORAL_TABLET | Freq: Every day | ORAL | Status: DC | PRN
  Administered 2023-10-28 – 2023-10-29 (×2): 1 via ORAL
  Filled 2023-10-26 (×2): qty 1

## 2023-10-26 MED ORDER — CHLORHEXIDINE GLUCONATE CLOTH 2 % EX PADS
6.0000 | MEDICATED_PAD | Freq: Every day | CUTANEOUS | Status: DC
  Administered 2023-10-27 – 2023-11-01 (×7): 6 via TOPICAL

## 2023-10-26 MED ORDER — MUPIROCIN 2 % EX OINT
TOPICAL_OINTMENT | Freq: Two times a day (BID) | CUTANEOUS | Status: DC
  Administered 2023-10-30: 1 via NASAL
  Filled 2023-10-26: qty 22

## 2023-10-26 MED ORDER — VANCOMYCIN HCL IN DEXTROSE 1-5 GM/200ML-% IV SOLN
1000.0000 mg | INTRAVENOUS | Status: DC
  Administered 2023-10-27 – 2023-10-29 (×3): 1000 mg via INTRAVENOUS
  Filled 2023-10-26 (×2): qty 200

## 2023-10-26 MED ORDER — POLYSACCHARIDE IRON COMPLEX 150 MG PO CAPS
150.0000 mg | ORAL_CAPSULE | Freq: Every day | ORAL | Status: DC
Start: 2023-10-26 — End: 2023-11-01
  Administered 2023-10-26 – 2023-11-01 (×9): 150 mg via ORAL
  Filled 2023-10-26 (×7): qty 1

## 2023-10-26 MED ORDER — ROSUVASTATIN CALCIUM 20 MG PO TABS
40.0000 mg | ORAL_TABLET | Freq: Every day | ORAL | Status: AC
Start: 2023-10-26 — End: ?
  Administered 2023-10-26 – 2023-11-01 (×9): 40 mg via ORAL
  Filled 2023-10-26 (×7): qty 2

## 2023-10-26 MED ORDER — APIXABAN 2.5 MG PO TABS
2.5000 mg | ORAL_TABLET | Freq: Two times a day (BID) | ORAL | Status: DC
  Administered 2023-10-26 – 2023-11-01 (×19): 2.5 mg via ORAL
  Filled 2023-10-26 (×13): qty 1

## 2023-10-26 MED ORDER — SODIUM CHLORIDE 0.9 % IV SOLN
1.0000 g | INTRAVENOUS | Status: DC
  Administered 2023-10-27 – 2023-10-29 (×5): 1 g via INTRAVENOUS
  Filled 2023-10-26 (×3): qty 10

## 2023-10-26 MED ORDER — CARVEDILOL 6.25 MG PO TABS
6.2500 mg | ORAL_TABLET | Freq: Every day | ORAL | Status: DC
  Administered 2023-10-26 – 2023-11-01 (×9): 6.25 mg via ORAL
  Filled 2023-10-26 (×7): qty 1

## 2023-10-26 MED ORDER — AMIODARONE HCL 200 MG PO TABS
200.0000 mg | ORAL_TABLET | Freq: Two times a day (BID) | ORAL | Status: DC
  Administered 2023-10-26 – 2023-11-01 (×17): 200 mg via ORAL
  Filled 2023-10-26 (×13): qty 1

## 2023-10-26 MED ORDER — SEVELAMER CARBONATE 800 MG PO TABS
2400.0000 mg | ORAL_TABLET | Freq: Three times a day (TID) | ORAL | Status: DC
  Administered 2023-10-26 – 2023-11-01 (×22): 2400 mg via ORAL
  Filled 2023-10-26 (×17): qty 3

## 2023-10-26 NOTE — ED Notes (Signed)
 TRANSPORT PAGED

## 2023-10-26 NOTE — Progress Notes (Signed)
 PROGRESS NOTE    Adam Howell   FMW:[id number] DOB: 07-Aug-1960  DOA: 10/25/2023 Date of Service: 10/26/23 which is hospital day 1  PCP: Valerio Melanie DASEN, NP    Hospital course / significant events:   HPI:  Adam Howell is a 63 y.o. male with medical history significant of  ESRD-HD (MWF), HTN, HLD, diet-controlled DM, CAD, dCHF, A fib on Eliquis , amenia, chronic pain, s/p of right AKA, who presents with SOB and cough x2 days worsening.   08/11: to ED. New O2 requirement (no O2 at home at baseline). WBC 7.9, procalcitonin 1.05, negative PCR for COVID, flu and RSV, CXR (+)Minimal patchy retrocardiac opacities, atelectasis versus infiltrate. Admitted to hospitalist for HCAP tx 08/12: continuing IV abx, pending cultures. dialysis UF only treatment today to attempt 2L fluid removal.      Consultants:  Nephrology   Procedures/Surgeries: none      ASSESSMENT & PLAN:   Possible HCAP (healthcare-associated pneumonia):  Chest x-ray showed minimal patchy retrocardiac opacities. Pt does not have fever or leukocytosis, but his procalcitonin is elevated at 1.05,  will treat patient as possible HCAP given healthcare setting exposure  IV Vancomycin  and cefepime  (patient also received 1 dose of azithromycin  in ED) Mucinex  for cough  Bronchodilators Urine legionella and S. pneumococcal antigen Follow up blood culture x2, sputum culture   Fluid overload and Chronic diastolic CHF (congestive heart failure) in setting of ESRD on dialysis  Patient is 1+ left leg edema, indicating some fluid overload.  BNP 1182 in setting of ESRD.  2D echo on 09/11/2023 showed EF of 55 to 60%. Consulted Dr. Marcelino of renal renal for dialysis. Strict I&O, daily weight   CAD (coronary artery disease) Crestor  Eliquis    Chronic hypotension Midodrine  continued  ESRD (end stage renal disease) on dialysis (MWF) Consulted Dr. Marcelino of renal for dialysis   Atrial fibrillation, chronic   Currently heart rate 70s Coreg , amiodarone  Eliquis    Hyperlipidemia associated with type 2 diabetes mellitus Crestor    Anemia in ESRD (end-stage renal disease) Hemoglobin stable, 9.1 (8.4 on 10/07/2023) on admission Hgb dropping to 7.6 10/26/23  Follow CBC Hold ASA Hgb decreased, will order ESA with scheduled dialysis tomorrow.    Chronic pain syndrome As needed Percocet and Tylenol    Insomnia Continue home ambien , Caution w/ higher dose (10 mg)    Class 1 obesity based on BMI: Body mass index is 30.34 kg/m.SABRA Significantly low or high BMI is associated with higher medical risk.  Underweight - under 18  overweight - 25 to 29 obese - 30 or more Class 1 obesity: BMI of 30.0 to 34 Class 2 obesity: BMI of 35.0 to 39 Class 3 obesity: BMI of 40.0 to 49 Super Morbid Obesity: BMI 50-59 Super-super Morbid Obesity: BMI 60+ Healthy nutrition and physical activity advised as adjunct to other disease management and risk reduction treatments    DVT prophylaxis: Eliquis  IV fluids: no continuous IV fluids  Nutrition: cardiac/carb diet  Central lines / other devices:   Code Status: FULL CODE ACP documentation reviewed:  none on file in [facility]  TOC needs: TBD Medical barriers to dispo: pneumonia and fluid overload, O2 requirement. Expected medical readiness for discharge pending clinical improvement.              Subjective / Brief ROS:  Patient reports no concerns right now Denies CP/SOB.  Pain controlled.  Denies new weakness.  Tolerating diet.  Reports no concerns w/ urination/defecation.   Family Communication: none at  this time    Objective Findings:  Vitals:   10/26/23 0114 10/26/23 0427 10/26/23 0842 10/26/23 1110  BP: 137/79 127/67 125/60 125/75  Pulse: 72 61 67 61  Resp: 20 20 18 16   Temp: 98.6 F (37 C) 98 F (36.7 C) 98.7 F (37.1 C) 98.6 F (37 C)  TempSrc:   Oral   SpO2: 99% 100% 94% 99%  Weight: 109.5 kg 110.1 kg    Height:       No  intake or output data in the 24 hours ending 10/26/23 1237 Filed Weights   10/25/23 1525 10/26/23 0114 10/26/23 0427  Weight: 103.4 kg 109.5 kg 110.1 kg    Examination:  Physical Exam Constitutional:      General: He is not in acute distress. Cardiovascular:     Rate and Rhythm: Normal rate and regular rhythm.  Pulmonary:     Breath sounds: Decreased breath sounds and wheezing present.  Musculoskeletal:     Left lower leg: Edema (trace) present.     Comments: RLE AKA  Skin:    General: Skin is warm and dry.  Neurological:     General: No focal deficit present.     Mental Status: [name] and oriented to person, place, and time.  Psychiatric:        Mood and Affect: Mood normal.        Behavior: Behavior normal.          Scheduled Medications:   amiodarone   200 mg Oral BID   apixaban   2.5 mg Oral BID   carvedilol   6.25 mg Oral Q breakfast   [START ON 10/27/2023] Chlorhexidine  Gluconate Cloth  6 each Topical Q0600   iron  polysaccharides  150 mg Oral Daily   mupirocin  ointment   Nasal BID   rosuvastatin   40 mg Oral Daily   sevelamer  carbonate  2,400 mg Oral TID with meals    Continuous Infusions:  [START ON 10/27/2023] ceFEPime  (MAXIPIME ) IV     [START ON 10/27/2023] vancomycin       PRN Medications:  acetaminophen , albuterol , dextromethorphan -guaiFENesin , hydrALAZINE , midodrine , ondansetron  (ZOFRAN ) IV, oxyCODONE -acetaminophen , zolpidem   Antimicrobials from admission:  Anti-infectives (From admission, onward)    Start     Dose/Rate Route Frequency Ordered Stop   10/27/23 1200  vancomycin  (VANCOCIN ) IVPB 1000 mg/200 mL premix        1,000 mg 200 mL/hr over 60 Minutes Intravenous Every M-W-F (Hemodialysis) 10/26/23 0241     10/27/23 0000  ceFEPIme  (MAXIPIME ) 1 g in sodium chloride  0.9 % 100 mL IVPB        1 g 200 mL/hr over 30 Minutes Intravenous Every 24 hours 10/26/23 0240     10/26/23 0315  vancomycin  (VANCOREADY) IVPB 1500 mg/300 mL        1,500 mg 150  mL/hr over 120 Minutes Intravenous  Once 10/26/23 0216 10/26/23 0504   10/25/23 2230  vancomycin  (VANCOCIN ) IVPB 1000 mg/200 mL premix        1,000 mg 200 mL/hr over 60 Minutes Intravenous  Once 10/25/23 2219 10/26/23 0308   10/25/23 2230  ceFEPIme  (MAXIPIME ) 2 g in sodium chloride  0.9 % 100 mL IVPB        2 g 200 mL/hr over 30 Minutes Intravenous  Once 10/25/23 2219 10/26/23 0045   10/25/23 2230  azithromycin  (ZITHROMAX ) 500 mg in sodium chloride  0.9 % 250 mL IVPB        500 mg 250 mL/hr over 60 Minutes Intravenous  Once 10/25/23 2219  10/26/23 0200           Data Reviewed:  I have personally reviewed the following...  CBC: Recent Labs  Lab 10/25/23 1745 10/26/23 0418  WBC 7.9 8.1  NEUTROABS 6.7  --   HGB 9.1* 7.6*  HCT 29.6* 25.2*  MCV 85.8 86.6  PLT 170 132*   Basic Metabolic Panel: Recent Labs  Lab 10/25/23 1745 10/26/23 0418  NA 140 139  K 3.6 3.8  CL 95* 99  CO2 30 30  GLUCOSE 87 96  BUN 26* 32*  CREATININE 5.39* 6.14*  CALCIUM  8.8* 8.2*   GFR: Estimated Creatinine Clearance: 16.7 mL/min (A) (by C-G formula based on SCr of 6.14 mg/dL (H)). Liver Function Tests: Recent Labs  Lab 10/25/23 1745  AST 41  ALT 29  ALKPHOS 81  BILITOT 1.2  PROT 8.4*  ALBUMIN  3.4*   No results for input(s): LIPASE, AMYLASE in the last 168 hours. No results for input(s): AMMONIA in the last 168 hours. Coagulation Profile: No results for input(s): INR, PROTIME in the last 168 hours. Cardiac Enzymes: No results for input(s): CKTOTAL, CKMB, CKMBINDEX, TROPONINI in the last 168 hours. BNP (last 3 results) No results for input(s): PROBNP in the last 8760 hours. HbA1C: No results for input(s): HGBA1C in the last 72 hours. CBG: No results for input(s): GLUCAP in the last 168 hours. Lipid Profile: No results for input(s): CHOL, HDL, LDLCALC, TRIG, CHOLHDL, LDLDIRECT in the last 72 hours. Thyroid  Function Tests: No results for input(s):  TSH, T4TOTAL, FREET4, T3FREE, THYROIDAB in the last 72 hours. Anemia Panel: No results for input(s): VITAMINB12, FOLATE, FERRITIN, TIBC, IRON , RETICCTPCT in the last 72 hours. Most Recent Urinalysis On File:  No results found for: COLORURINE, APPEARANCEUR, LABSPEC, PHURINE, GLUCOSEU, HGBUR, BILIRUBINUR, KETONESUR, PROTEINUR, UROBILINOGEN, NITRITE, LEUKOCYTESUR Sepsis Labs: @LABRCNTIP (procalcitonin:4,lacticidven:4) Microbiology: Recent Results (from the past 240 hours)  Resp panel by RT-PCR (RSV, Flu A&B, Covid) Anterior Nasal Swab     Status: None   Collection Time: 10/25/23 11:36 PM   Specimen: Anterior Nasal Swab  Result Value Ref Range Status   SARS Coronavirus 2 by RT PCR NEGATIVE NEGATIVE Final    Comment: (NOTE) SARS-CoV-2 target nucleic acids are NOT DETECTED.  The SARS-CoV-2 RNA is generally detectable in upper respiratory specimens during the acute phase of infection. The lowest concentration of SARS-CoV-2 viral copies this assay can detect is 138 copies/mL. A negative result does not preclude SARS-Cov-2 infection and should not be used as the sole basis for treatment or other patient management decisions. A negative result may occur with  improper specimen collection/handling, submission of specimen other than nasopharyngeal swab, presence of viral mutation(s) within the areas targeted by this assay, and inadequate number of viral copies(<138 copies/mL). A negative result must be combined with clinical observations, patient history, and epidemiological information. The expected result is Negative.  Fact Sheet for Patients:  BloggerCourse.com  Fact Sheet for Healthcare Providers:  SeriousBroker.it  This test is no t yet approved or cleared by the [place]  FDA and  has been authorized for detection and/or diagnosis of SARS-CoV-2 by FDA under an Emergency Use Authorization  (EUA). This EUA will remain  in effect (meaning this test can be used) for the duration of the COVID-19 declaration under Section 564(b)(1) of the Act, 21 U.S.C.section 360bbb-3(b)(1), unless the authorization is terminated  or revoked sooner.       Influenza A by PCR NEGATIVE NEGATIVE Final   Influenza B by PCR NEGATIVE NEGATIVE Final  Comment: (NOTE) The Xpert Xpress SARS-CoV-2/FLU/RSV plus assay is intended as an aid in the diagnosis of influenza from Nasopharyngeal swab specimens and should not be used as a sole basis for treatment. Nasal washings and aspirates are unacceptable for Xpert Xpress SARS-CoV-2/FLU/RSV testing.  Fact Sheet for Patients: BloggerCourse.com  Fact Sheet for Healthcare Providers: SeriousBroker.it  This test is not yet approved or cleared by the [place]  FDA and has been authorized for detection and/or diagnosis of SARS-CoV-2 by FDA under an Emergency Use Authorization (EUA). This EUA will remain in effect (meaning this test can be used) for the duration of the COVID-19 declaration under Section 564(b)(1) of the Act, 21 U.S.C. section 360bbb-3(b)(1), unless the authorization is terminated or revoked.     Resp Syncytial Virus by PCR NEGATIVE NEGATIVE Final    Comment: (NOTE) Fact Sheet for Patients: BloggerCourse.com  Fact Sheet for Healthcare Providers: SeriousBroker.it  This test is not yet approved or cleared by the [place]  FDA and has been authorized for detection and/or diagnosis of SARS-CoV-2 by FDA under an Emergency Use Authorization (EUA). This EUA will remain in effect (meaning this test can be used) for the duration of the COVID-19 declaration under Section 564(b)(1) of the Act, 21 U.S.C. section 360bbb-3(b)(1), unless the authorization is terminated or revoked.  Performed at [facility], [address]  [place], [place] [address]   MRSA Next Gen by PCR, Nasal     Status: Abnormal   Collection Time: 10/26/23  5:15 AM   Specimen: Nasal Mucosa; Nasal Swab  Result Value Ref Range Status   MRSA by PCR Next Gen DETECTED (A) NOT DETECTED Final    Comment: RESULT CALLED TO, READ BACK BY AND VERIFIED WITH: L'BELLLE BREEZE 10/26/23 0855 MW (NOTE) The GeneXpert MRSA Assay (FDA approved for NASAL specimens only), is one component of a comprehensive MRSA colonization surveillance program. It is not intended to diagnose MRSA infection nor to guide or monitor treatment for MRSA infections. Test performance is not FDA approved in patients less than 83 years old. Performed at [facility], [address] [place], [place] [address]       Radiology Studies last 3 days: DG Chest Portable 1 View Result Date: 10/25/2023 CLINICAL DATA:  Shortness of breath EXAM: PORTABLE CHEST 1 VIEW COMPARISON:  Chest x-ray 08/11/2023 FINDINGS: Left-sided central venous catheter tip projects over the SVC. The heart is enlarged. There are small bilateral pleural effusions. There are minimal patchy retrocardiac opacities. There is no pneumothorax or acute fracture. IMPRESSION: 1. Cardiomegaly with small bilateral pleural effusions. 2. Minimal patchy retrocardiac opacities, atelectasis versus infiltrate. Electronically Signed   By: Greig Pique M.D.   On: 10/25/2023 21:32        Nyron Mozer, DO [facility] 10/26/2023, 12:37 PM    Dictation software may have been used to generate the above note. Typos may occur and escape review in typed/dictated notes. Please contact Dr Marsa directly for clarity if needed.  Staff may message me via secure chat in Epic  but this may not receive an immediate response,  please page me for urgent matters!  If 7PM-7AM, please contact night coverage [email or web address]

## 2023-10-26 NOTE — Plan of Care (Signed)
   Problem: Education: Goal: Knowledge of General Education information will improve Description: Including pain rating scale, medication(s)/side effects and non-pharmacologic comfort measures Outcome: Progressing   Problem: Health Behavior/Discharge Planning: Goal: Ability to manage health-related needs will improve Outcome: Progressing   Problem: Clinical Measurements: Goal: Ability to maintain clinical measurements within normal limits will improve Outcome: Progressing Goal: Will remain free from infection Outcome: Progressing Goal: Diagnostic test results will improve Outcome: Progressing Goal: Respiratory complications will improve Outcome: Progressing Goal: Cardiovascular complication will be avoided Outcome: Progressing   Problem: Activity: Goal: Risk for activity intolerance will decrease Outcome: Progressing   Problem: Nutrition: Goal: Adequate nutrition will be maintained Outcome: Progressing   Problem: Coping: Goal: Level of anxiety will decrease Outcome: Progressing   Problem: Elimination: Goal: Will not experience complications related to bowel motility Outcome: Progressing Goal: Will not experience complications related to urinary retention Outcome: Progressing   Problem: Pain Managment: Goal: General experience of comfort will improve and/or be controlled Outcome: Progressing   Problem: Safety: Goal: Ability to remain free from injury will improve Outcome: Progressing   Problem: Skin Integrity: Goal: Risk for impaired skin integrity will decrease Outcome: Progressing   Problem: Activity: Goal: Ability to tolerate increased activity will improve Outcome: Progressing   Problem: Respiratory: Goal: Ability to maintain adequate ventilation will improve Outcome: Progressing Goal: Ability to maintain a clear airway will improve Outcome: Progressing

## 2023-10-26 NOTE — Hospital Course (Addendum)
 Hospital course / significant events:   HPI:  Adam Howell is a 63 y.o. male with medical history significant of  ESRD-HD (MWF), HTN, HLD, diet-controlled DM, CAD, dCHF, A fib on Eliquis , amenia, chronic pain, s/p of right AKA, who presents with SOB and cough x2 days worsening.   08/11: to ED. New O2 requirement (no O2 at home at baseline). WBC 7.9, procalcitonin 1.05, negative PCR for COVID, flu and RSV, CXR (+)Minimal patchy retrocardiac opacities, atelectasis versus infiltrate. Admitted to hospitalist for HCAP tx 08/12: continuing IV abx, pending cultures. dialysis UF only treatment today to attempt 2L fluid removal.      Consultants:  Nephrology   Procedures/Surgeries: none      ASSESSMENT & PLAN:   Possible HCAP (healthcare-associated pneumonia):  Chest x-ray showed minimal patchy retrocardiac opacities. Pt does not have fever or leukocytosis, but his procalcitonin is elevated at 1.05,  will treat patient as possible HCAP given healthcare setting exposure  IV Vancomycin  and cefepime  (patient also received 1 dose of azithromycin  in ED) Mucinex  for cough  Bronchodilators Urine legionella and S. pneumococcal antigen Follow up blood culture x2, sputum culture   Fluid overload and Chronic diastolic CHF (congestive heart failure) in setting of ESRD on dialysis  Patient is 1+ left leg edema, indicating some fluid overload.  BNP 1182 in setting of ESRD.  2D echo on 09/11/2023 showed EF of 55 to 60%. Consulted Dr. Marcelino of renal renal for dialysis. Strict I&O, daily weight   CAD (coronary artery disease) Crestor  Eliquis    Chronic hypotension Midodrine  continued  ESRD (end stage renal disease) on dialysis (MWF) Consulted Dr. Marcelino of renal for dialysis   Atrial fibrillation, chronic  Currently heart rate 70s Coreg , amiodarone  Eliquis    Hyperlipidemia associated with type 2 diabetes mellitus Crestor    Anemia in ESRD (end-stage renal disease) Hemoglobin stable,  9.1 (8.4 on 10/07/2023) on admission Hgb dropping to 7.6 10/26/23  Follow CBC Hold ASA Hgb decreased, will order ESA with scheduled dialysis tomorrow.    Chronic pain syndrome As needed Percocet and Tylenol    Insomnia Continue home ambien , Caution w/ higher dose (10 mg)    Class 1 obesity based on BMI: Body mass index is 30.34 kg/m.SABRA Significantly low or high BMI is associated with higher medical risk.  Underweight - under 18  overweight - 25 to 29 obese - 30 or more Class 1 obesity: BMI of 30.0 to 34 Class 2 obesity: BMI of 35.0 to 39 Class 3 obesity: BMI of 40.0 to 49 Super Morbid Obesity: BMI 50-59 Super-super Morbid Obesity: BMI 60+ Healthy nutrition and physical activity advised as adjunct to other disease management and risk reduction treatments    DVT prophylaxis: Eliquis  IV fluids: no continuous IV fluids  Nutrition: cardiac/carb diet  Central lines / other devices:   Code Status: FULL CODE ACP documentation reviewed:  none on file in [facility]  TOC needs: TBD Medical barriers to dispo: pneumonia and fluid overload, O2 requirement. Expected medical readiness for discharge pending clinical improvement.

## 2023-10-26 NOTE — TOC Initial Note (Signed)
 Transition of Care [facility]) - Initial/Assessment Note    Patient Details  Name: Delfin Squillace MRN: [id number] Date of Birth: Jul 01, 1960  Transition of Care [facility]) CM/SW Contact:    Alfonso Rummer, LCSW Phone Number: 10/26/2023, 10:52 AM  Clinical Narrative:     KEN DELENA Rummer completed readmit screening with pt [name] in room 101 ems from home. [name] reports Melanie Polio  NP is his regular primary care physician.  Pt picks up prescriptions at [facility] and is currently active [facility]. [name] has a walker, wheelchair and prosthetic leg. [name] reports he attends dialysis mon, wed and Fridays at [facility] and transports via [employer] transportation. [name] was alert, pleasant and responsive during LCSW assessment.                     Patient Goals and CMS Choice          Expected Discharge Plan and Services      [name] desires to continue home health services with [facility].                                         Prior Living Arrangements/Services      Per previous epic documentation [name] lives alone.                  Activities of Daily Living   ADL Screening (condition at time of admission) Independently performs ADLs?: Yes (appropriate for developmental age) Is the patient deaf or have difficulty hearing?: No Does the patient have difficulty seeing, even when wearing glasses/contacts?: No Does the patient have difficulty concentrating, remembering, or making decisions?: No  Permission Sought/Granted                  Emotional Assessment        [name] was alert, pleasant and responsive during assessment.       Admission diagnosis:  Hypoxia [R09.02] HCAP (healthcare-associated pneumonia) [J18.9] Community acquired pneumonia, unspecified laterality [J18.9] Patient Active Problem List   Diagnosis Date Noted   Fluid overload 10/26/2023   HCAP (healthcare-associated pneumonia)  10/25/2023   Venous stasis ulcer of left lower leg (HCC) 09/16/2023   Right hand pain 09/16/2023   MRSA bacteremia 09/07/2023   ESRD (end stage renal disease) on dialysis (HCC) 09/07/2023   Acute hypoxic respiratory failure (HCC) 09/03/2023   Endocarditis 09/03/2023   Aspiration pneumonia (HCC) 08/29/2023   Chronic diastolic CHF (congestive heart failure) (HCC) 08/29/2023   Pancytopenia (HCC) 08/29/2023   Anemia in ESRD (end-stage renal disease) (HCC) 08/29/2023   CAD (coronary artery disease) 08/29/2023   S/P AKA (above knee amputation), right (HCC) 03/15/2022   Controlled substance agreement signed 04/10/2021   Other thrombophilia (HCC) 01/19/2020   Atrial fibrillation, chronic (HCC) 11/11/2019   Hyperlipidemia associated with type 2 diabetes mellitus (HCC) 11/11/2019   Erectile dysfunction 11/11/2019   Heart failure with reduced ejection fraction (HCC) 10/26/2019   Type 2 diabetes mellitus with ESRD (end-stage renal disease) (HCC) 10/04/2019   Chronic pain syndrome 10/04/2019   Insomnia 10/04/2019   Atherosclerotic heart disease of native coronary artery without angina pectoris 09/01/2019   Hypertensive heart and kidney disease with heart failure and chronic kidney disease stage V (HCC) 09/01/2019   Obesity (BMI 30-39.9) 09/01/2019   Anemia in chronic kidney disease 07/25/2019   Peripheral  vascular disease (HCC) 07/25/2019   Secondary hyperparathyroidism of renal origin (HCC) 07/25/2019   PCP:  Valerio Melanie DASEN, NP Pharmacy:   [facility] [address] (N),  - [address] [place] ([place]) [place] [address] Phone: [phone] Fax: [phone]  [facility] [address], [place] - [address] [place] [address] Phone: [phone] Fax: [phone]  [facility]  - [place], [place] - [address] [place] [address] Phone: [phone] Fax:  [phone]     Social Drivers of Health (SDOH) Social History: SDOH Screenings   Food Insecurity: No Food Insecurity (10/26/2023)  Housing: Low Risk  (10/26/2023)  Transportation Needs: No Transportation Needs (10/26/2023)  Utilities: Not At Risk (10/26/2023)  Alcohol Screen: Low Risk  (11/25/2021)  Depression (PHQ2-9): Low Risk  (04/14/2023)  Financial Resource Strain: Low Risk  (11/25/2021)  Physical Activity: Insufficiently Active (11/25/2021)  Social Connections: Moderately Isolated (10/26/2023)  Stress: No Stress Concern Present (11/25/2021)  Tobacco Use: Low Risk  (10/26/2023)   SDOH Interventions:     Readmission Risk Interventions    09/01/2023   11:18 AM 02/16/2023    6:21 PM  Readmission Risk Prevention Plan  Transportation Screening  Complete  PCP or Specialist Appt within 3-5 Days Complete   HRI or Home Care Consult Complete   Social Work Consult for Recovery Care Planning/Counseling Complete   Palliative Care Screening Not Applicable Not Applicable  Medication Review Oceanographer)  Complete

## 2023-10-26 NOTE — Consult Note (Signed)
 WOC Nurse Consult Note: Reason for Consult: Requested to wound care orders Wound type: Diabetic ulcers Pressure Injury POA: NA Left leg Measurement: 1.5 cm x 0.8 cm x 0.1 cm Wound bed: 100% pale granulation Drainage (amount, consistency, odor) Minimum, no odor, serous. Periwound: dry peeling skin, intact. Left foot - posterior side Measurement: 0.5 x 0.5 x 0.2 cm Wound bed: 100% yellow slough Peri-wound: roller edges, intact. Minimum drainage, serous, no odor. Dressing procedure/placement/frequency: Cleanse with Vashe #[id number], pat dry, not rinse. Apply Xeroform to the wound bed on leg daily. Apply Medihoney to the wound L foot daily. Wrap the foot and leg with kerlix and ACE wrap, to the base of toes, until below the knee. Change daily or PRN.  WOC team will not plan to follow further. Please reconsult if further assistance is needed. Thank-you,  Lela Holm RN, CNS, [employer], MSN.  (Phone [phone])

## 2023-10-26 NOTE — Progress Notes (Signed)
 Pharmacy Antibiotic Note  Adam Howell is a 63 y.o. male admitted on 10/25/2023 with pneumonia.  Pharmacy has been consulted for Vanc, Cefepime  dosing.  Pt on HD every MWF.  Plan: Cefepime  2 gm IV X given in ED on 8/12 @ 0014. Cefepime  1 gm IV Q24H ordered to start on 8/13 @ 0000.  Vancomycin  1 gm IV X 1 given on 8/12 @ 0208.  Additional Vanc 1500 mg IV X 1 ordered to make total loading dose of 2500 mg.  Vancomycin  1 gm IV Q MWF - HD ordered to start on 8/13 @ 1200.  - no Vanc trough currently ordered   Height: 6' 3 (190.5 cm) Weight: 109.5 kg (241 lb 6.5 oz) IBW/kg (Calculated) : 84.5  Temp (24hrs), Avg:98.9 F (37.2 C), Min:98.6 F (37 C), Max:99.6 F (37.6 C)  Recent Labs  Lab 10/25/23 1745  WBC 7.9  CREATININE 5.39*    Estimated Creatinine Clearance: 19 mL/min (A) (by C-G formula based on SCr of 5.39 mg/dL (H)).    Allergies  Allergen Reactions   Ivp Dye [Iodinated Contrast Media] Anaphylaxis   Tramadol  Rash    Antimicrobials this admission:   >>    >>   Dose adjustments this admission:   Microbiology results:  BCx:   UCx:    Sputum:    MRSA PCR:   Thank you for allowing pharmacy to be a part of this patient's care.  Naomie Crow D 10/26/2023 2:43 AM

## 2023-10-26 NOTE — Progress Notes (Signed)
 [facility]  ROUNDING NOTE   Subjective:   Adam Howell is a 63 y.o male with past medical history of diabetes, hypertension, CAD, Rt AKA, anemia, CHF, and ESRD on dialysis. Patient presents to ED with shortness of breath and cough and has been admitted for Hypoxia [R09.02] HCAP (healthcare-associated pneumonia) [J18.9] Community acquired pneumonia, unspecified laterality [J18.9]  Patient is known to our practice and receives dialysis at [facility] on MWF schedule, supervised by [facility] physicians. He states he has had these symptoms for 3 days. Denies sick contacts. Seen resting in bed, drowsy but arousable. Untouched breakfast tray at bedside. 3L [place] during visit.   Labs on ED arrival remarkable for renal elevation.  BNP greater than 1100.  Hemoglobin 9.1.Respiratory panel negative for flu, covid 19 and RSV. Chest xray shows small bilateral pleural effusions with patchy retrocardiac opacities.   We have been consulted to provide dialysis during this admission.    Objective:  Vital signs in last 24 hours:  Temp:  [98 F (36.7 C)-99.6 F (37.6 C)] 98.6 F (37 C) (08/12 1110) Pulse Rate:  [61-74] 61 (08/12 1110) Resp:  [16-30] 16 (08/12 1110) BP: (125-145)/(59-79) 125/75 (08/12 1110) SpO2:  [94 %-100 %] 99 % (08/12 1110) Weight:  [103.4 kg-110.1 kg] 110.1 kg (08/12 0427)  Weight change:  Filed Weights   10/25/23 1525 10/26/23 0114 10/26/23 0427  Weight: 103.4 kg 109.5 kg 110.1 kg    Intake/Output: No intake/output data recorded.   Intake/Output this shift:  No intake/output data recorded.  Physical Exam: General: NAD  Head: Normocephalic, atraumatic. Moist oral mucosal membranes  Eyes: Anicteric  Neck: Supple  Lungs:  Rhonchi/wheezes throughout  Heart: Regular rate and rhythm  Abdomen:  Soft, nontender  Extremities:  1+ peripheral edema.  Neurologic: Awake, alert, conversant  Skin: Warm,dry, no rash  Access: Lt AVF    Basic Metabolic  Panel: Recent Labs  Lab 10/25/23 1745 10/26/23 0418  NA 140 139  K 3.6 3.8  CL 95* 99  CO2 30 30  GLUCOSE 87 96  BUN 26* 32*  CREATININE 5.39* 6.14*  CALCIUM  8.8* 8.2*    Liver Function Tests: Recent Labs  Lab 10/25/23 1745  AST 41  ALT 29  ALKPHOS 81  BILITOT 1.2  PROT 8.4*  ALBUMIN  3.4*   No results for input(s): LIPASE, AMYLASE in the last 168 hours. No results for input(s): AMMONIA in the last 168 hours.  CBC: Recent Labs  Lab 10/25/23 1745 10/26/23 0418  WBC 7.9 8.1  NEUTROABS 6.7  --   HGB 9.1* 7.6*  HCT 29.6* 25.2*  MCV 85.8 86.6  PLT 170 132*    Cardiac Enzymes: No results for input(s): CKTOTAL, CKMB, CKMBINDEX, TROPONINI in the last 168 hours.  BNP: Invalid input(s): POCBNP  CBG: No results for input(s): GLUCAP in the last 168 hours.  Microbiology: Results for orders placed or performed during the hospital encounter of 10/25/23  Resp panel by RT-PCR (RSV, Flu A&B, Covid) Anterior Nasal Swab     Status: None   Collection Time: 10/25/23 11:36 PM   Specimen: Anterior Nasal Swab  Result Value Ref Range Status   SARS Coronavirus 2 by RT PCR NEGATIVE NEGATIVE Final    Comment: (NOTE) SARS-CoV-2 target nucleic acids are NOT DETECTED.  The SARS-CoV-2 RNA is generally detectable in upper respiratory specimens during the acute phase of infection. The lowest concentration of SARS-CoV-2 viral copies this assay can detect is 138 copies/mL. A negative result does not preclude  SARS-Cov-2 infection and should not be used as the sole basis for treatment or other patient management decisions. A negative result may occur with  improper specimen collection/handling, submission of specimen other than nasopharyngeal swab, presence of viral mutation(s) within the areas targeted by this assay, and inadequate number of viral copies(<138 copies/mL). A negative result must be combined with clinical observations, patient history, and  epidemiological information. The expected result is Negative.  Fact Sheet for Patients:  BloggerCourse.com  Fact Sheet for Healthcare Providers:  SeriousBroker.it  This test is no t yet approved or cleared by the [place]  FDA and  has been authorized for detection and/or diagnosis of SARS-CoV-2 by FDA under an Emergency Use Authorization (EUA). This EUA will remain  in effect (meaning this test can be used) for the duration of the COVID-19 declaration under Section 564(b)(1) of the Act, 21 U.S.C.section 360bbb-3(b)(1), unless the authorization is terminated  or revoked sooner.       Influenza A by PCR NEGATIVE NEGATIVE Final   Influenza B by PCR NEGATIVE NEGATIVE Final    Comment: (NOTE) The Xpert Xpress SARS-CoV-2/FLU/RSV plus assay is intended as an aid in the diagnosis of influenza from Nasopharyngeal swab specimens and should not be used as a sole basis for treatment. Nasal washings and aspirates are unacceptable for Xpert Xpress SARS-CoV-2/FLU/RSV testing.  Fact Sheet for Patients: BloggerCourse.com  Fact Sheet for Healthcare Providers: SeriousBroker.it  This test is not yet approved or cleared by the [place]  FDA and has been authorized for detection and/or diagnosis of SARS-CoV-2 by FDA under an Emergency Use Authorization (EUA). This EUA will remain in effect (meaning this test can be used) for the duration of the COVID-19 declaration under Section 564(b)(1) of the Act, 21 U.S.C. section 360bbb-3(b)(1), unless the authorization is terminated or revoked.     Resp Syncytial Virus by PCR NEGATIVE NEGATIVE Final    Comment: (NOTE) Fact Sheet for Patients: BloggerCourse.com  Fact Sheet for Healthcare Providers: SeriousBroker.it  This test is not yet approved or cleared by the [place]  FDA and has been  authorized for detection and/or diagnosis of SARS-CoV-2 by FDA under an Emergency Use Authorization (EUA). This EUA will remain in effect (meaning this test can be used) for the duration of the COVID-19 declaration under Section 564(b)(1) of the Act, 21 U.S.C. section 360bbb-3(b)(1), unless the authorization is terminated or revoked.  Performed at [facility], [address] [place], [place] [address]   MRSA Next Gen by PCR, Nasal     Status: Abnormal   Collection Time: 10/26/23  5:15 AM   Specimen: Nasal Mucosa; Nasal Swab  Result Value Ref Range Status   MRSA by PCR Next Gen DETECTED (A) NOT DETECTED Final    Comment: RESULT CALLED TO, READ BACK BY AND VERIFIED WITH: Adam Howell 10/26/23 0855 MW (NOTE) The GeneXpert MRSA Assay (FDA approved for NASAL specimens only), is one component of a comprehensive MRSA colonization surveillance program. It is not intended to diagnose MRSA infection nor to guide or monitor treatment for MRSA infections. Test performance is not FDA approved in patients less than 52 years old. Performed at [facility], [address] [place], [place] [address]     Coagulation Studies: No results for input(s): LABPROT, INR in the last 72 hours.  Urinalysis: No results for input(s): COLORURINE, LABSPEC, PHURINE, GLUCOSEU, HGBUR, BILIRUBINUR, KETONESUR, PROTEINUR, UROBILINOGEN, NITRITE, LEUKOCYTESUR in the last 72 hours.  Invalid input(s): APPERANCEUR    Imaging: DG Chest Portable 1  View Result Date: 10/25/2023 CLINICAL DATA:  Shortness of breath EXAM: PORTABLE CHEST 1 VIEW COMPARISON:  Chest x-ray 08/11/2023 FINDINGS: Left-sided central venous catheter tip projects over the SVC. The heart is enlarged. There are small bilateral pleural effusions. There are minimal patchy retrocardiac opacities. There is no pneumothorax or acute fracture. IMPRESSION: 1. Cardiomegaly with small bilateral pleural effusions.  2. Minimal patchy retrocardiac opacities, atelectasis versus infiltrate. Electronically Signed   By: Greig Pique M.D.   On: 10/25/2023 21:32     Medications:    [START ON 10/27/2023] ceFEPime  (MAXIPIME ) IV     [START ON 10/27/2023] vancomycin       amiodarone   200 mg Oral BID   apixaban   2.5 mg Oral BID   carvedilol   6.25 mg Oral Q breakfast   [START ON 10/27/2023] Chlorhexidine  Gluconate Cloth  6 each Topical Q0600   iron  polysaccharides  150 mg Oral Daily   mupirocin  ointment   Nasal BID   rosuvastatin   40 mg Oral Daily   sevelamer  carbonate  2,400 mg Oral TID with meals   acetaminophen , albuterol , dextromethorphan -guaiFENesin , hydrALAZINE , midodrine , ondansetron  (ZOFRAN ) IV, oxyCODONE -acetaminophen , zolpidem   Assessment/ Plan:  Mr. Ellias Mcelreath is a 63 y.o.  male with past medical history of diabetes, hypertension, CAD, Rt AKA, anemia, CHF, and ESRD on dialysis.   [facility]/MWF/left AVF   End-stage renal disease on dialysis.  Last treatment completed on Monday.  Due to clinical exam, patient agreeable to UF only treatment today. Will attempt 2L fluid removal. Will receive scheduled treatment tomorrow.   2. Anemia of chronic kidney disease Lab Results  Component Value Date   HGB 7.6 (L) 10/26/2023    Hgb decreased, will order ESA with scheduled dialysis tomorrow.   3. Secondary Hyperparathyroidism: with outpatient labs: PTH 982, phosphorus 5.9, calcium  8.4 on 10/18/23.   Lab Results  Component Value Date   CALCIUM  8.2 (L) 10/26/2023   CAION 1.06 (L) 08/05/2023   PHOS 3.2 09/07/2023    Will continue to monitor bone minerals.    4.  Hypertension with chronic kidney disease.  Currently receiving amiodarone , carvedilol , and midodrine .   LOS: 1 Adam Howell 8/12/202511:59 AM

## 2023-10-26 NOTE — Progress Notes (Signed)
   10/26/23 1750  Vitals  Temp 98.6 F (37 C)  BP (!) 130/57  MAP (mmHg) 77  Pulse Rate (!) 57  ECG Heart Rate (!) 56  Resp 19  Oxygen Therapy  SpO2 100 %  O2 Device Nasal Cannula  During Treatment Monitoring  Blood Flow Rate (mL/min) 199 mL/min  Arterial Pressure (mmHg) -94.54 mmHg  Venous Pressure (mmHg) 91.91 mmHg  TMP (mmHg) -23.43 mmHg  Ultrafiltration Rate (mL/min) 0 mL/min  Dialysate Flow Rate (mL/min) 189 ml/min  Dialysate Potassium Concentration 2  Dialysate Calcium  Concentration 2.5  Duration of HD Treatment -hour(s) 1.43 hour(s)  Cumulative Fluid Removed (mL) per Treatment  1420.51  Post Treatment  Dialyzer Clearance Lightly streaked  Liters Processed 35.3  Fluid Removed (mL) 1400 mL  Tolerated HD Treatment Yes  Post-Hemodialysis Comments 1400 removed. Patient experienced a slight drop in blood pressure during HD. Blood pressure improved with reduced goal.  Hemodialysis Catheter Left Internal jugular Double lumen Permanent (Tunneled)  Placement Date/Time: 09/06/23 1124   Serial / Lot #: [id number]  Expiration Date: 01/13/26  Time Out: Correct patient;Correct site;Correct procedure  Maximum sterile barrier precautions: Hand hygiene;Large sterile sheet;Sterile probe cover;Cap;Mask;St...  Site Condition No complications  Blue Lumen Status Flushed;Dead end cap in place;Heparin  locked  Red Lumen Status Flushed;Dead end cap in place;Heparin  locked  Purple Lumen Status N/A  Catheter fill solution Heparin  1000 units/ml  Catheter fill volume (Arterial) 2.2 cc  Catheter fill volume (Venous) 2.4  Dressing Type Transparent  Dressing Status Antimicrobial disc/dressing in place;Clean, Dry, Intact;Clean;Dry  Interventions New dressing  Drainage Description None  Dressing Change Due 11/02/23  Post treatment catheter status Capped and Clamped

## 2023-10-26 NOTE — TOC Initial Note (Signed)
 Transition of Care [facility]) - Initial/Assessment Note    Patient Details  Name: Adam Howell MRN: [id number] Date of Birth: Jul 01, 1960  Transition of Care [facility]) CM/SW Contact:    Alfonso Rummer, LCSW Phone Number: 10/26/2023, 10:52 AM  Clinical Narrative:     Adam Howell Rummer completed readmit screening with [name] in room 101 ems from home. Adam Howell reports Adam Polio  NP is his regular primary care physician.  Pt picks up prescriptions at [facility] and is currently [facility]. Adam Howell has a walker, wheelchair and prosthetic leg. Adam Howell reports he attends dialysis mon, wed and Fridays at [facility] and transports via [employer] transportation. Adam Howell was alert, pleasant and responsive during LCSW assessment.                     Patient Goals and CMS Choice          Expected Discharge Plan and Services      Adam Howell desires to continue home health services with [facility].                                         Prior Living Arrangements/Services      Per previous epic documentation Adam Howell lives alone.                  Activities of Daily Living   ADL Screening (condition at time of admission) Independently performs ADLs?: Yes (appropriate for developmental age) Is the patient deaf or have difficulty hearing?: No Does the patient have difficulty seeing, even when wearing glasses/contacts?: No Does the patient have difficulty concentrating, remembering, or making decisions?: No  Permission Sought/Granted                  Emotional Assessment        Adam Howell was alert, pleasant and responsive during assessment.       Admission diagnosis:  Hypoxia [R09.02] HCAP (healthcare-associated pneumonia) [J18.9] Community acquired pneumonia, unspecified laterality [J18.9] Patient Active Problem List   Diagnosis Date Noted   Fluid overload 10/26/2023   HCAP (healthcare-associated pneumonia)  10/25/2023   Venous stasis ulcer of left lower leg (HCC) 09/16/2023   Right hand pain 09/16/2023   MRSA bacteremia 09/07/2023   ESRD (end stage renal disease) on dialysis (HCC) 09/07/2023   Acute hypoxic respiratory failure (HCC) 09/03/2023   Endocarditis 09/03/2023   Aspiration pneumonia (HCC) 08/29/2023   Chronic diastolic CHF (congestive heart failure) (HCC) 08/29/2023   Pancytopenia (HCC) 08/29/2023   Anemia in ESRD (end-stage renal disease) (HCC) 08/29/2023   CAD (coronary artery disease) 08/29/2023   S/P AKA (above knee amputation), right (HCC) 03/15/2022   Controlled substance agreement signed 04/10/2021   Other thrombophilia (HCC) 01/19/2020   Atrial fibrillation, chronic (HCC) 11/11/2019   Hyperlipidemia associated with type 2 diabetes mellitus (HCC) 11/11/2019   Erectile dysfunction 11/11/2019   Heart failure with reduced ejection fraction (HCC) 10/26/2019   Type 2 diabetes mellitus with ESRD (end-stage renal disease) (HCC) 10/04/2019   Chronic pain syndrome 10/04/2019   Insomnia 10/04/2019   Atherosclerotic heart disease of native coronary artery without angina pectoris 09/01/2019   Hypertensive heart and kidney disease with heart failure and chronic kidney disease stage V (HCC) 09/01/2019   Obesity (BMI 30-39.9) 09/01/2019   Anemia in chronic kidney disease 07/25/2019   Peripheral  vascular disease (HCC) 07/25/2019   Secondary hyperparathyroidism of renal origin (HCC) 07/25/2019   PCP:  Valerio Adam DASEN, NP Pharmacy:   [facility] [address] (N),  - [address] [place] ([place]) [place] [address] Phone: [phone] Fax: [phone]  [facility] [address], [place] - [address] [place] [address] Phone: [phone] Fax: [phone]  [facility]  - [place], [place] - [address] [place] [address] Phone: [phone] Fax:  [phone]     Social Drivers of Health (SDOH) Social History: SDOH Screenings   Food Insecurity: No Food Insecurity (10/26/2023)  Housing: Low Risk  (10/26/2023)  Transportation Needs: No Transportation Needs (10/26/2023)  Utilities: Not At Risk (10/26/2023)  Alcohol Screen: Low Risk  (11/25/2021)  Depression (PHQ2-9): Low Risk  (04/14/2023)  Financial Resource Strain: Low Risk  (11/25/2021)  Physical Activity: Insufficiently Active (11/25/2021)  Social Connections: Moderately Isolated (10/26/2023)  Stress: No Stress Concern Present (11/25/2021)  Tobacco Use: Low Risk  (10/26/2023)   SDOH Interventions:     Readmission Risk Interventions    09/01/2023   11:18 AM 02/16/2023    6:21 PM  Readmission Risk Prevention Plan  Transportation Screening  Complete  PCP or Specialist Appt within 3-5 Days Complete   HRI or Home Care Consult Complete   Social Work Consult for Recovery Care Planning/Counseling Complete   Palliative Care Screening Not Applicable Not Applicable  Medication Review Oceanographer)  Complete

## 2023-10-26 NOTE — Progress Notes (Signed)
 RN asked to check pt SPO2. Pt was on 5Lnc, SPO2 86%. Pt BBS diminished but clear throughout. Pt SPO2 80-95% while in the room. Pt is asleep and a mouth breather. Placed pt on 8L HFNC. SPO2, 94%. RN aware.

## 2023-10-27 DIAGNOSIS — N186 End stage renal disease: Secondary | ICD-10-CM | POA: Diagnosis not present

## 2023-10-27 DIAGNOSIS — J189 Pneumonia, unspecified organism: Secondary | ICD-10-CM | POA: Diagnosis not present

## 2023-10-27 LAB — BLOOD CULTURE ID PANEL (REFLEXED) - BCID2

## 2023-10-27 LAB — BASIC METABOLIC PANEL WITH GFR
Anion gap: 14 (ref 5–15)
BUN: 44 mg/dL — ABNORMAL HIGH (ref 8–23)
CO2: 27 mmol/L (ref 22–32)
Calcium: 8.1 mg/dL — ABNORMAL LOW (ref 8.9–10.3)
Chloride: 95 mmol/L — ABNORMAL LOW (ref 98–111)
Creatinine, Ser: 7.78 mg/dL — ABNORMAL HIGH (ref 0.61–1.24)
GFR, Estimated: 7 mL/min — ABNORMAL LOW (ref >60)
Glucose, Bld: 96 mg/dL (ref 70–99)
Potassium: 4 mmol/L (ref 3.5–5.1)
Sodium: 136 mmol/L (ref 135–145)

## 2023-10-27 LAB — CBC
HCT: 24.4 % — ABNORMAL LOW (ref 39.0–52.0)
Hemoglobin: 7.4 g/dL — ABNORMAL LOW (ref 13.0–17.0)
MCH: 26.4 pg (ref 26.0–34.0)
MCHC: 30.3 g/dL (ref 30.0–36.0)
MCV: 87.1 fL (ref 80.0–100.0)
Platelets: 119 K/uL — ABNORMAL LOW (ref 150–400)
RBC: 2.8 MIL/uL — ABNORMAL LOW (ref 4.22–5.81)
RDW: 18.6 % — ABNORMAL HIGH (ref 11.5–15.5)
WBC: 4.7 K/uL (ref 4.0–10.5)
nRBC: 0.4 % — ABNORMAL HIGH (ref 0.0–0.2)

## 2023-10-27 LAB — GLUCOSE, CAPILLARY: Glucose-Capillary: 122 mg/dL — ABNORMAL HIGH (ref 70–99)

## 2023-10-27 MED ORDER — HEPARIN SODIUM (PORCINE) 1000 UNIT/ML IJ SOLN
INTRAMUSCULAR | Status: AC
Start: 2023-10-27 — End: 2023-10-27
  Filled 2023-10-27: qty 5

## 2023-10-27 MED ORDER — EPOETIN ALFA-EPBX 10000 UNIT/ML IJ SOLN
10000.0000 [IU] | INTRAMUSCULAR | Status: DC
  Administered 2023-10-27 – 2023-10-29 (×3): 10000 [IU] via INTRAVENOUS
  Filled 2023-10-27 (×4): qty 2

## 2023-10-27 NOTE — Care Management Important Message (Signed)
 Important Message  Patient Details  Name: Adam Howell MRN: [id number] Date of Birth: [birth year]   Important Message Given:  Yes - Medicare IM     [name] 10/27/2023, 3:32 PM

## 2023-10-27 NOTE — Progress Notes (Signed)
 [facility]  ROUNDING NOTE   Subjective:   Adam Howell is a 63 y.o male with past medical history of diabetes, hypertension, CAD, Rt AKA, anemia, CHF, and ESRD on dialysis. Patient presents to ED with shortness of breath and cough and has been admitted for Hypoxia [R09.02] HCAP (healthcare-associated pneumonia) [J18.9] Community acquired pneumonia, unspecified laterality [J18.9]  Patient is known to our practice and receives dialysis at [facility] on MWF schedule, supervised by [facility] physicians. He states he has had these symptoms for 3 days. Denies sick contacts. Seen resting in bed, drowsy but arousable. Untouched breakfast tray at bedside. 3L [place] during visit.   Labs on ED arrival remarkable for renal elevation.  BNP greater than 1100.  Hemoglobin 9.1.Respiratory panel negative for flu, covid 19 and RSV. Chest xray shows small bilateral pleural effusions with patchy retrocardiac opacities.   Update:  Patient seen down in the dialysis suite this a.m. Due for dialysis session today. Shortness of breath appears improved.   Objective:  Vital signs in last 24 hours:  Temp:  [97.5 F (36.4 C)-99.6 F (37.6 C)] 98.1 F (36.7 C) (08/13 0407) Pulse Rate:  [54-67] 58 (08/13 0407) Resp:  [16-25] 23 (08/13 0706) BP: (102-132)/(57-88) 121/63 (08/13 0407) SpO2:  [73 %-100 %] 100 % (08/13 0407) Weight:  [891 kg] 108 kg (08/13 0500)  Weight change: 4.6 kg Filed Weights   10/26/23 0427 10/26/23 1458 10/27/23 0500  Weight: 110.1 kg 108 kg 108 kg    Intake/Output: I/O last 3 completed shifts: In: 474 [P.O.:474] Out: 1400 [Other:1400]   Intake/Output this shift:  No intake/output data recorded.  Physical Exam: General: NAD  Head: Normocephalic, atraumatic. Moist oral mucosal membranes  Eyes: Anicteric  Neck: Supple  Lungs:  Bilateral rhonchi  Heart: Regular rate and rhythm  Abdomen:  Soft, nontender  Extremities:  1+ peripheral edema.  Neurologic: Awake,  alert, conversant  Skin: Warm,dry, no rash  Access: Lt AVF    Basic Metabolic Panel: Recent Labs  Lab 10/25/23 1745 10/26/23 0418 10/27/23 0317  NA 140 139 136  K 3.6 3.8 4.0  CL 95* 99 95*  CO2 30 30 27   GLUCOSE 87 96 96  BUN 26* 32* 44*  CREATININE 5.39* 6.14* 7.78*  CALCIUM  8.8* 8.2* 8.1*    Liver Function Tests: Recent Labs  Lab 10/25/23 1745  AST 41  ALT 29  ALKPHOS 81  BILITOT 1.2  PROT 8.4*  ALBUMIN  3.4*   No results for input(s): LIPASE, AMYLASE in the last 168 hours. No results for input(s): AMMONIA in the last 168 hours.  CBC: Recent Labs  Lab 10/25/23 1745 10/26/23 0418 10/27/23 0317  WBC 7.9 8.1 4.7  NEUTROABS 6.7  --   --   HGB 9.1* 7.6* 7.4*  HCT 29.6* 25.2* 24.4*  MCV 85.8 86.6 87.1  PLT 170 132* 119*    Cardiac Enzymes: No results for input(s): CKTOTAL, CKMB, CKMBINDEX, TROPONINI in the last 168 hours.  BNP: Invalid input(s): POCBNP  CBG: Recent Labs  Lab 10/26/23 1744  GLUCAP 89    Microbiology: Results for orders placed or performed during the hospital encounter of 10/25/23  Blood Culture (routine x 2)     Status: None (Preliminary result)   Collection Time: 10/25/23 10:29 PM   Specimen: BLOOD  Result Value Ref Range Status   Specimen Description BLOOD BLOOD RIGHT ARM  Final   Special Requests   Final    BOTTLES DRAWN AEROBIC AND ANAEROBIC Blood Culture adequate  volume   Culture   Final    NO GROWTH 2 DAYS Performed at [facility], [address] [place], [place] [address]    Report Status PENDING  Incomplete  Blood Culture (routine x 2)     Status: None (Preliminary result)   Collection Time: 10/25/23 10:29 PM   Specimen: BLOOD  Result Value Ref Range Status   Specimen Description BLOOD BLOOD RIGHT HAND  Final   Special Requests Blood Culture adequate volume  Final   Culture   Final    NO GROWTH 2 DAYS Performed at [facility], [address] [place], [place] [address]     Report Status PENDING  Incomplete  Resp panel by RT-PCR (RSV, Flu A&B, Covid) Anterior Nasal Swab     Status: None   Collection Time: 10/25/23 11:36 PM   Specimen: Anterior Nasal Swab  Result Value Ref Range Status   SARS Coronavirus 2 by RT PCR NEGATIVE NEGATIVE Final    Comment: (NOTE) SARS-CoV-2 target nucleic acids are NOT DETECTED.  The SARS-CoV-2 RNA is generally detectable in upper respiratory specimens during the acute phase of infection. The lowest concentration of SARS-CoV-2 viral copies this assay can detect is 138 copies/mL. A negative result does not preclude SARS-Cov-2 infection and should not be used as the sole basis for treatment or other patient management decisions. A negative result may occur with  improper specimen collection/handling, submission of specimen other than nasopharyngeal swab, presence of viral mutation(s) within the areas targeted by this assay, and inadequate number of viral copies(<138 copies/mL). A negative result must be combined with clinical observations, patient history, and epidemiological information. The expected result is Negative.  Fact Sheet for Patients:  BloggerCourse.com  Fact Sheet for Healthcare Providers:  SeriousBroker.it  This test is no t yet approved or cleared by the [place]  FDA and  has been authorized for detection and/or diagnosis of SARS-CoV-2 by FDA under an Emergency Use Authorization (EUA). This EUA will remain  in effect (meaning this test can be used) for the duration of the COVID-19 declaration under Section 564(b)(1) of the Act, 21 U.S.C.section 360bbb-3(b)(1), unless the authorization is terminated  or revoked sooner.       Influenza A by PCR NEGATIVE NEGATIVE Final   Influenza B by PCR NEGATIVE NEGATIVE Final    Comment: (NOTE) The Xpert Xpress SARS-CoV-2/FLU/RSV plus assay is intended as an aid in the diagnosis of influenza from Nasopharyngeal swab  specimens and should not be used as a sole basis for treatment. Nasal washings and aspirates are unacceptable for Xpert Xpress SARS-CoV-2/FLU/RSV testing.  Fact Sheet for Patients: BloggerCourse.com  Fact Sheet for Healthcare Providers: SeriousBroker.it  This test is not yet approved or cleared by the [place]  FDA and has been authorized for detection and/or diagnosis of SARS-CoV-2 by FDA under an Emergency Use Authorization (EUA). This EUA will remain in effect (meaning this test can be used) for the duration of the COVID-19 declaration under Section 564(b)(1) of the Act, 21 U.S.C. section 360bbb-3(b)(1), unless the authorization is terminated or revoked.     Resp Syncytial Virus by PCR NEGATIVE NEGATIVE Final    Comment: (NOTE) Fact Sheet for Patients: BloggerCourse.com  Fact Sheet for Healthcare Providers: SeriousBroker.it  This test is not yet approved or cleared by the [place]  FDA and has been authorized for detection and/or diagnosis of SARS-CoV-2 by FDA under an Emergency Use Authorization (EUA). This EUA will remain in effect (meaning this test can be used) for  the duration of the COVID-19 declaration under Section 564(b)(1) of the Act, 21 U.S.C. section 360bbb-3(b)(1), unless the authorization is terminated or revoked.  Performed at [facility], [address] [place], [place] [address]   MRSA Next Gen by PCR, Nasal     Status: Abnormal   Collection Time: 10/26/23  5:15 AM   Specimen: Nasal Mucosa; Nasal Swab  Result Value Ref Range Status   MRSA by PCR Next Gen DETECTED (A) NOT DETECTED Final    Comment: RESULT CALLED TO, READ BACK BY AND VERIFIED WITH: L'BELLLE BREEZE 10/26/23 0855 MW (NOTE) The GeneXpert MRSA Assay (FDA approved for NASAL specimens only), is one component of a comprehensive MRSA colonization surveillance program. It is  not intended to diagnose MRSA infection nor to guide or monitor treatment for MRSA infections. Test performance is not FDA approved in patients less than 99 years old. Performed at [facility], [address] [place], [place] [address]     Coagulation Studies: No results for input(s): LABPROT, INR in the last 72 hours.  Urinalysis: No results for input(s): COLORURINE, LABSPEC, PHURINE, GLUCOSEU, HGBUR, BILIRUBINUR, KETONESUR, PROTEINUR, UROBILINOGEN, NITRITE, LEUKOCYTESUR in the last 72 hours.  Invalid input(s): APPERANCEUR    Imaging: DG Chest Portable 1 View Result Date: 10/25/2023 CLINICAL DATA:  Shortness of breath EXAM: PORTABLE CHEST 1 VIEW COMPARISON:  Chest x-ray 08/11/2023 FINDINGS: Left-sided central venous catheter tip projects over the SVC. The heart is enlarged. There are small bilateral pleural effusions. There are minimal patchy retrocardiac opacities. There is no pneumothorax or acute fracture. IMPRESSION: 1. Cardiomegaly with small bilateral pleural effusions. 2. Minimal patchy retrocardiac opacities, atelectasis versus infiltrate. Electronically Signed   By: Greig Pique M.D.   On: 10/25/2023 21:32     Medications:    ceFEPime  (MAXIPIME ) IV 1 g (10/27/23 0158)   vancomycin       amiodarone   200 mg Oral BID   apixaban   2.5 mg Oral BID   carvedilol   6.25 mg Oral Q breakfast   Chlorhexidine  Gluconate Cloth  6 each Topical Q0600   iron  polysaccharides  150 mg Oral Daily   leptospermum manuka honey  1 Application Topical Daily   mupirocin  ointment   Nasal BID   rosuvastatin   40 mg Oral Daily   sevelamer  carbonate  2,400 mg Oral TID with meals   acetaminophen , albuterol , dextromethorphan -guaiFENesin , ondansetron  (ZOFRAN ) IV, oxyCODONE -acetaminophen , zolpidem   Assessment/ Plan:  Adam Howell is a 63 y.o.  male with past medical history of diabetes, hypertension, CAD, Rt AKA, anemia, CHF, and ESRD on dialysis.    [facility]/MWF/left AVF   End-stage renal disease on dialysis.  Patient due for regular dialysis treatment today.  Shortness of breath appears to be improved.  Continue to monitor.  2. Anemia of chronic kidney disease Lab Results  Component Value Date   HGB 7.4 (L) 10/27/2023    Hemoglobin low at 7.4.  Administer Epogen  10,000 units IV with dialysis today.  3. Secondary Hyperparathyroidism: with outpatient labs: PTH 982, phosphorus 5.9, calcium  8.4 on 10/18/23.   Lab Results  Component Value Date   CALCIUM  8.1 (L) 10/27/2023   CAION 1.06 (L) 08/05/2023   PHOS 3.2 09/07/2023    Phosphorus currently at target at 3.2 but this was a Adam value.  No need to continue to monitor bone marrow problems and parameters.   4.  Hypertension with chronic kidney disease.  Continue carvedilol  for hypertension control.     LOS: 2 Rashun Grattan  8/13/20258:05 AM

## 2023-10-27 NOTE — Plan of Care (Signed)

## 2023-10-27 NOTE — Progress Notes (Signed)
   10/27/23 1203  Vitals  Temp 97.9 F (36.6 C)  BP (!) 140/76  Pulse Rate 64  Resp (!) 22  Weight 106.1 kg  Type of Weight Post-Dialysis  Oxygen Therapy  SpO2 100 %  O2 Device Nasal Cannula  Post Treatment  Dialyzer Clearance Lightly streaked  Liters Processed 78.6  Fluid Removed (mL) 1500 mL  Tolerated HD Treatment Yes  Post-Hemodialysis Comments Goal has been met Tx has been tolerated. VS are stable. Vancomycin  administered post treatment. No verbalized concerns. Blood glucose is 122  Hemodialysis Catheter Left Internal jugular Double lumen Permanent (Tunneled)  Placement Date/Time: 09/06/23 1124   Serial / Lot #: [id number]  Expiration Date: 01/13/26  Time Out: Correct patient;Correct site;Correct procedure  Maximum sterile barrier precautions: Hand hygiene;Large sterile sheet;Sterile probe cover;Cap;Mask;St...  Site Condition No complications  Blue Lumen Status Flushed;Heparin  locked;Dead end cap in place  Red Lumen Status Flushed;Heparin  locked;Dead end cap in place  Purple Lumen Status N/A  Catheter fill solution Heparin  1000 units/ml  Catheter fill volume (Arterial) 2 cc  Catheter fill volume (Venous) 2.2  Dressing Type Transparent  Dressing Status Antimicrobial disc/dressing in place;Clean, Dry, Intact;Clean;Dry  Interventions Other (Comment)  Drainage Description None  Post treatment catheter status Capped and Clamped

## 2023-10-27 NOTE — Progress Notes (Signed)
 PHARMACY - PHYSICIAN COMMUNICATION CRITICAL VALUE ALERT - BLOOD CULTURE IDENTIFICATION (BCID)  Adam Howell is an 63 y.o. male who presented to [facility] on 10/25/2023 with a chief complaint of HCAP  Assessment:  Gram positive cocci in 1 of 4 bottles (aerobic) probably contaminant (include suspected source if known)  Name of physician (or Provider) Contacted: Erminio Cone, NP   Current antibiotics: Vanc, Cefepime    Changes to prescribed antibiotics recommended:  Patient is on recommended antibiotics - No changes needed  Results for orders placed or performed during the hospital encounter of 10/25/23  Blood Culture ID Panel (Reflexed) (Collected: 10/25/2023 10:29 PM)  Result Value Ref Range   Enterococcus faecalis NOT DETECTED NOT DETECTED   Enterococcus Faecium NOT DETECTED NOT DETECTED   Listeria monocytogenes NOT DETECTED NOT DETECTED   Staphylococcus species NOT DETECTED NOT DETECTED   Staphylococcus aureus (BCID) NOT DETECTED NOT DETECTED   Staphylococcus epidermidis NOT DETECTED NOT DETECTED   Staphylococcus lugdunensis NOT DETECTED NOT DETECTED   Streptococcus species NOT DETECTED NOT DETECTED   Streptococcus agalactiae NOT DETECTED NOT DETECTED   Streptococcus pneumoniae NOT DETECTED NOT DETECTED   Streptococcus pyogenes NOT DETECTED NOT DETECTED   A.calcoaceticus-baumannii NOT DETECTED NOT DETECTED   Bacteroides fragilis NOT DETECTED NOT DETECTED   Enterobacterales NOT DETECTED NOT DETECTED   Enterobacter cloacae complex NOT DETECTED NOT DETECTED   Escherichia coli NOT DETECTED NOT DETECTED   Klebsiella aerogenes NOT DETECTED NOT DETECTED   Klebsiella oxytoca NOT DETECTED NOT DETECTED   Klebsiella pneumoniae NOT DETECTED NOT DETECTED   Proteus species NOT DETECTED NOT DETECTED   Salmonella species NOT DETECTED NOT DETECTED   Serratia marcescens NOT DETECTED NOT DETECTED   Haemophilus influenzae NOT DETECTED NOT DETECTED   Neisseria meningitidis NOT DETECTED  NOT DETECTED   Pseudomonas aeruginosa NOT DETECTED NOT DETECTED   Stenotrophomonas maltophilia NOT DETECTED NOT DETECTED   Candida albicans NOT DETECTED NOT DETECTED   Candida auris NOT DETECTED NOT DETECTED   Candida glabrata NOT DETECTED NOT DETECTED   Candida krusei NOT DETECTED NOT DETECTED   Candida parapsilosis NOT DETECTED NOT DETECTED   Candida tropicalis NOT DETECTED NOT DETECTED   Cryptococcus neoformans/gattii NOT DETECTED NOT DETECTED    [name] D 10/27/2023  9:59 PM

## 2023-10-27 NOTE — Progress Notes (Signed)
 PROGRESS NOTE  Adam Howell    DOB: Jul 18, 1960, 63 y.o.  FMW:[id number]    Code Status: Full Code   DOA: 10/25/2023   LOS: 2   Brief hospital course  Adam Howell is a 63 y.o. male with medical history significant of  ESRD-HD (MWF), HTN, HLD, diet-controlled DM, CAD, dCHF, A fib on Eliquis , amenia, chronic pain, s/p of right AKA, who presents with SOB and cough x2 days worsening  Requiring O2 and multifical pna on cxray. Admitted for HAP. Started on IV Abx and continued on routine HD  10/27/23 -stable, on 4L. Continue to wean. HD went well today.   Assessment & Plan  Principal Problem:   HCAP (healthcare-associated pneumonia) Active Problems:   Fluid overload   Chronic diastolic CHF (congestive heart failure) (HCC)   CAD (coronary artery disease)   ESRD (end stage renal disease) on dialysis (HCC)   Atrial fibrillation, chronic (HCC)   Hyperlipidemia associated with type 2 diabetes mellitus (HCC)   Anemia in ESRD (end-stage renal disease) (HCC)   Chronic pain syndrome   Obesity (BMI 30-39.9)  HAP- has previously been intubated and O2 requirement this year. Was not on home oxygen prior to admission.  Chest x-ray showed minimal patchy retrocardiac opacities. Pt does not have fever or leukocytosis, but his procalcitonin is elevated at 1.05. Continue IV Vancomycin  and cefepime  (patient also received 1 dose of azithromycin  in ED) MRSA swab positive. Sputum culture pending. Neg covid, flu, rsv Mucinex  for cough  Bronchodilators Wean O2 as tolerated   Chronic diastolic CHF- not in acute exacerbation per exam findings. BNP 1182 in setting of ESRD.  2D echo on 09/11/2023 showed EF of 55 to 60%. Strict I&O, daily weight   CAD (coronary artery disease) Crestor   Chronic hypotension Midodrine  continued on HD days   ESRD (end stage renal disease) on dialysis (MWF) Continue HD per nephrology.    Atrial fibrillation, chronic  Currently heart rate 50-60s Coreg ,  amiodarone  Eliquis    Hyperlipidemia associated with type 2 diabetes mellitus Crestor    Anemia in ESRD (end-stage renal disease) Hemoglobin stable, 9.1 (8.4 on 10/07/2023) on admission Hgb dropping to 7.6 10/26/23  Follow CBC Hold ASA   Chronic pain syndrome As needed Percocet and Tylenol    Insomnia Continue home ambien , Caution w/ higher dose (10 mg)  Body mass index is 29.76 kg/m.  VTE ppx: apixaban  (ELIQUIS ) tablet 2.5 mg Start: 10/26/23 0245 apixaban  (ELIQUIS ) tablet 2.5 mg   Diet:     Diet   Diet heart healthy/carb modified Fluid consistency: Thin; Fluid restriction: 1200 mL Fluid   Consultants: Nephrology   Subjective 10/27/23    Pt reports feeling well. His HD went well today without concerns, per patient. Denies SOB while resting. States he's going home with his daughter on dc   Objective  Blood pressure 121/63, pulse (!) 58, temperature 98.1 F (36.7 C), resp. rate (!) 23, height 6' 3 (1.905 m), weight 108 kg, SpO2 100%.  Intake/Output Summary (Last 24 hours) at 10/27/2023 0717 Last data filed at 10/26/2023 1900 Gross per 24 hour  Intake 474 ml  Output 1400 ml  Net -926 ml   Filed Weights   10/26/23 0427 10/26/23 1458 10/27/23 0500  Weight: 110.1 kg 108 kg 108 kg    Physical Exam:  General: awake, alert, NAD HEENT: atraumatic, clear conjunctiva, anicteric sclera, MMM, hearing grossly normal Respiratory: normal respiratory effort. CTAB Cardiovascular: quick capillary refill, normal S1/S2, RRR, no JVD, murmurs. Left tunneled cath in place  Gastrointestinal: soft, NT, ND Nervous: A&O x3. no gross focal neurologic deficits, normal speech Extremities: R L amputation. Minimal swelling Skin: dry, intact, normal temperature, normal color. No rashes, lesions or ulcers on exposed skin Psychiatry: flat mood, congruent affect  Labs   I have personally reviewed the following labs and imaging studies CBC    Component Value Date/Time   WBC 4.7 10/27/2023 0317    RBC 2.80 (L) 10/27/2023 0317   HGB 7.4 (L) 10/27/2023 0317   HGB 16.8 01/12/2023 1414   HCT 24.4 (L) 10/27/2023 0317   HCT 51.0 01/12/2023 1414   PLT 119 (L) 10/27/2023 0317   PLT 257 01/12/2023 1414   MCV 87.1 10/27/2023 0317   MCV 88 01/12/2023 1414   MCH 26.4 10/27/2023 0317   MCHC 30.3 10/27/2023 0317   RDW 18.6 (H) 10/27/2023 0317   RDW 14.8 01/12/2023 1414   LYMPHSABS 0.5 (L) 10/25/2023 1745   LYMPHSABS 1.0 01/12/2023 1414   MONOABS 0.5 10/25/2023 1745   EOSABS 0.2 10/25/2023 1745   EOSABS 0.2 01/12/2023 1414   BASOSABS 0.1 10/25/2023 1745   BASOSABS 0.1 01/12/2023 1414      Latest Ref Rng & Units 10/27/2023    3:17 AM 10/26/2023    4:18 AM 10/25/2023    5:45 PM  BMP  Glucose 70 - 99 mg/dL 96  96  87   BUN 8 - 23 mg/dL 44  32  26   Creatinine 0.61 - 1.24 mg/dL 2.21  3.85  4.60   Sodium 135 - 145 mmol/L 136  139  140   Potassium 3.5 - 5.1 mmol/L 4.0  3.8  3.6   Chloride 98 - 111 mmol/L 95  99  95   CO2 22 - 32 mmol/L 27  30  30    Calcium  8.9 - 10.3 mg/dL 8.1  8.2  8.8     DG Chest Portable 1 View Result Date: 10/25/2023 CLINICAL DATA:  Shortness of breath EXAM: PORTABLE CHEST 1 VIEW COMPARISON:  Chest x-ray 08/11/2023 FINDINGS: Left-sided central venous catheter tip projects over the SVC. The heart is enlarged. There are small bilateral pleural effusions. There are minimal patchy retrocardiac opacities. There is no pneumothorax or acute fracture. IMPRESSION: 1. Cardiomegaly with small bilateral pleural effusions. 2. Minimal patchy retrocardiac opacities, atelectasis versus infiltrate. Electronically Signed   By: Greig Pique M.D.   On: 10/25/2023 21:32   Disposition Plan & Communication  Patient status: Inpatient  Admitted From: Home Planned disposition location: Home Anticipated discharge date: 8/15 pending respiratory improvement   Family Communication: none at bedside    Author: [name], DO [facility] 10/27/2023, 7:17 AM   Available by  Epic secure chat 7AM-7PM. If 7PM-7AM, please contact night-coverage.  [facility] contact information found on [email or web address].

## 2023-10-27 NOTE — Progress Notes (Signed)
 Pt receives outpt HD at [facility] [address] MWF at 7:10am chair time. Navigator following to assist with any HD needs.  Suzen Satchel Dialysis Navigator [phone]

## 2023-10-27 NOTE — Progress Notes (Signed)
 PHARMACY - PHYSICIAN COMMUNICATION CRITICAL VALUE ALERT - BLOOD CULTURE IDENTIFICATION (BCID)  Adam Howell is an 63 y.o. male who presented to [facility] on 10/25/2023 with a chief complaint of HCAP  Assessment:  Gram positive cocci in 1 of 4 bottles (aerobic) probably contaminant (include suspected source if known)  Name of physician (or Provider) Contacted: Erminio Cone, NP   Current antibiotics: Vanc, Cefepime    Changes to prescribed antibiotics recommended:  Patient is on recommended antibiotics - No changes needed  Results for orders placed or performed during the hospital encounter of 10/25/23  Blood Culture ID Panel (Reflexed) (Collected: 10/25/2023 10:29 PM)  Result Value Ref Range   Enterococcus faecalis NOT DETECTED NOT DETECTED   Enterococcus Faecium NOT DETECTED NOT DETECTED   Listeria monocytogenes NOT DETECTED NOT DETECTED   Staphylococcus species NOT DETECTED NOT DETECTED   Staphylococcus aureus (BCID) NOT DETECTED NOT DETECTED   Staphylococcus epidermidis NOT DETECTED NOT DETECTED   Staphylococcus lugdunensis NOT DETECTED NOT DETECTED   Streptococcus species NOT DETECTED NOT DETECTED   Streptococcus agalactiae NOT DETECTED NOT DETECTED   Streptococcus pneumoniae NOT DETECTED NOT DETECTED   Streptococcus pyogenes NOT DETECTED NOT DETECTED   A.calcoaceticus-baumannii NOT DETECTED NOT DETECTED   Bacteroides fragilis NOT DETECTED NOT DETECTED   Enterobacterales NOT DETECTED NOT DETECTED   Enterobacter cloacae complex NOT DETECTED NOT DETECTED   Escherichia coli NOT DETECTED NOT DETECTED   Klebsiella aerogenes NOT DETECTED NOT DETECTED   Klebsiella oxytoca NOT DETECTED NOT DETECTED   Klebsiella pneumoniae NOT DETECTED NOT DETECTED   Proteus species NOT DETECTED NOT DETECTED   Salmonella species NOT DETECTED NOT DETECTED   Serratia marcescens NOT DETECTED NOT DETECTED   Haemophilus influenzae NOT DETECTED NOT DETECTED   Neisseria meningitidis NOT DETECTED  NOT DETECTED   Pseudomonas aeruginosa NOT DETECTED NOT DETECTED   Stenotrophomonas maltophilia NOT DETECTED NOT DETECTED   Candida albicans NOT DETECTED NOT DETECTED   Candida auris NOT DETECTED NOT DETECTED   Candida glabrata NOT DETECTED NOT DETECTED   Candida krusei NOT DETECTED NOT DETECTED   Candida parapsilosis NOT DETECTED NOT DETECTED   Candida tropicalis NOT DETECTED NOT DETECTED   Cryptococcus neoformans/gattii NOT DETECTED NOT DETECTED    Adam Howell 10/27/2023  9:59 PM

## 2023-10-28 DIAGNOSIS — N186 End stage renal disease: Secondary | ICD-10-CM | POA: Diagnosis not present

## 2023-10-28 DIAGNOSIS — J189 Pneumonia, unspecified organism: Secondary | ICD-10-CM | POA: Diagnosis not present

## 2023-10-28 MED ORDER — ALUM & MAG HYDROXIDE-SIMETH 200-200-20 MG/5ML PO SUSP
30.0000 mL | ORAL | Status: DC | PRN
  Administered 2023-10-28: 30 mL via ORAL
  Filled 2023-10-28: qty 30

## 2023-10-28 MED ORDER — HYDRALAZINE HCL 20 MG/ML IJ SOLN
2.0000 mg | Freq: Once | INTRAMUSCULAR | Status: AC
  Administered 2023-10-28: 2 mg via INTRAVENOUS
  Filled 2023-10-28: qty 1

## 2023-10-28 NOTE — Progress Notes (Signed)
 [facility]  ROUNDING NOTE   Subjective:   Adam Howell is a 63 y.o male with past medical history of diabetes, hypertension, CAD, Rt AKA, anemia, CHF, and ESRD on dialysis. Patient presents to ED with shortness of breath and cough and has been admitted for Hypoxia [R09.02] HCAP (healthcare-associated pneumonia) [J18.9] Community acquired pneumonia, unspecified laterality [J18.9]  Patient is known to our practice and receives dialysis at [facility] on MWF schedule, supervised by [facility] physicians.  Update:  Seen sitting up in bed Completed breakfast tray at bedside Remains on 2L   Objective:  Vital signs in last 24 hours:  Temp:  [97.8 F (36.6 C)-98.7 F (37.1 C)] 97.8 F (36.6 C) (08/14 0739) Pulse Rate:  [58-67] 66 (08/14 0739) Resp:  [16-24] 18 (08/14 0304) BP: (126-153)/(66-90) 153/90 (08/14 0739) SpO2:  [96 %-100 %] 97 % (08/14 0923) Weight:  [106.1 kg-108.6 kg] 108.6 kg (08/14 0403)  Weight change: -0.4 kg Filed Weights   10/27/23 0815 10/27/23 1203 10/28/23 0403  Weight: 107.6 kg 106.1 kg 108.6 kg    Intake/Output: I/O last 3 completed shifts: In: 720 [P.O.:720] Out: 1500 [Other:1500]   Intake/Output this shift:  No intake/output data recorded.  Physical Exam: General: NAD  Head: Normocephalic, atraumatic. Moist oral mucosal membranes  Eyes: Anicteric  Neck: Supple  Lungs:  Diminished, [place] O2  Heart: Regular rate and rhythm  Abdomen:  Soft, nontender  Extremities:  1+ peripheral edema, AKA  Neurologic: Awake, alert, conversant  Skin: Warm,dry, no rash  Access: Lt AVF    Basic Metabolic Panel: Recent Labs  Lab 10/25/23 1745 10/26/23 0418 10/27/23 0317  NA 140 139 136  K 3.6 3.8 4.0  CL 95* 99 95*  CO2 30 30 27   GLUCOSE 87 96 96  BUN 26* 32* 44*  CREATININE 5.39* 6.14* 7.78*  CALCIUM  8.8* 8.2* 8.1*    Liver Function Tests: Recent Labs  Lab 10/25/23 1745  AST 41  ALT 29  ALKPHOS 81  BILITOT 1.2  PROT 8.4*   ALBUMIN  3.4*   No results for input(s): LIPASE, AMYLASE in the last 168 hours. No results for input(s): AMMONIA in the last 168 hours.  CBC: Recent Labs  Lab 10/25/23 1745 10/26/23 0418 10/27/23 0317  WBC 7.9 8.1 4.7  NEUTROABS 6.7  --   --   HGB 9.1* 7.6* 7.4*  HCT 29.6* 25.2* 24.4*  MCV 85.8 86.6 87.1  PLT 170 132* 119*    Cardiac Enzymes: No results for input(s): CKTOTAL, CKMB, CKMBINDEX, TROPONINI in the last 168 hours.  BNP: Invalid input(s): POCBNP  CBG: Recent Labs  Lab 10/26/23 1744 10/27/23 1156  GLUCAP 89 122*    Microbiology: Results for orders placed or performed during the hospital encounter of 10/25/23  Blood Culture (routine x 2)     Status: None (Preliminary result)   Collection Time: 10/25/23 10:29 PM   Specimen: BLOOD  Result Value Ref Range Status   Specimen Description BLOOD BLOOD RIGHT ARM  Final   Special Requests   Final    BOTTLES DRAWN AEROBIC AND ANAEROBIC Blood Culture adequate volume   Culture   Final    NO GROWTH 3 DAYS Performed at [facility], [address], [place], [place] [address]    Report Status PENDING  Incomplete  Blood Culture (routine x 2)     Status: None (Preliminary result)   Collection Time: 10/25/23 10:29 PM   Specimen: BLOOD  Result Value Ref Range Status  Specimen Description BLOOD BLOOD RIGHT HAND  Final   Special Requests Blood Culture adequate volume  Final   Culture  Setup Time   Final    GRAM POSITIVE COCCI AEROBIC BOTTLE ONLY Organism ID to follow CRITICAL RESULT CALLED TO, READ BACK BY AND VERIFIED WITH: JSAON ROBBINS 2150 10/27/23 MU Performed at [facility], [address] [place], [place] [address]    Culture GRAM POSITIVE COCCI  Final   Report Status PENDING  Incomplete  Blood Culture ID Panel (Reflexed)     Status: None   Collection Time: 10/25/23 10:29 PM  Result Value Ref Range Status   Enterococcus faecalis NOT DETECTED NOT DETECTED Final    Enterococcus Faecium NOT DETECTED NOT DETECTED Final   Listeria monocytogenes NOT DETECTED NOT DETECTED Final   Staphylococcus species NOT DETECTED NOT DETECTED Final   Staphylococcus aureus (BCID) NOT DETECTED NOT DETECTED Final   Staphylococcus epidermidis NOT DETECTED NOT DETECTED Final   Staphylococcus lugdunensis NOT DETECTED NOT DETECTED Final   Streptococcus species NOT DETECTED NOT DETECTED Final   Streptococcus agalactiae NOT DETECTED NOT DETECTED Final   Streptococcus pneumoniae NOT DETECTED NOT DETECTED Final   Streptococcus pyogenes NOT DETECTED NOT DETECTED Final   A.calcoaceticus-baumannii NOT DETECTED NOT DETECTED Final   Bacteroides fragilis NOT DETECTED NOT DETECTED Final   Enterobacterales NOT DETECTED NOT DETECTED Final   Enterobacter cloacae complex NOT DETECTED NOT DETECTED Final   Escherichia coli NOT DETECTED NOT DETECTED Final   Klebsiella aerogenes NOT DETECTED NOT DETECTED Final   Klebsiella oxytoca NOT DETECTED NOT DETECTED Final   Klebsiella pneumoniae NOT DETECTED NOT DETECTED Final   Proteus species NOT DETECTED NOT DETECTED Final   Salmonella species NOT DETECTED NOT DETECTED Final   Serratia marcescens NOT DETECTED NOT DETECTED Final   Haemophilus influenzae NOT DETECTED NOT DETECTED Final   Neisseria meningitidis NOT DETECTED NOT DETECTED Final   Pseudomonas aeruginosa NOT DETECTED NOT DETECTED Final   Stenotrophomonas maltophilia NOT DETECTED NOT DETECTED Final   Candida albicans NOT DETECTED NOT DETECTED Final   Candida auris NOT DETECTED NOT DETECTED Final   Candida glabrata NOT DETECTED NOT DETECTED Final   Candida krusei NOT DETECTED NOT DETECTED Final   Candida parapsilosis NOT DETECTED NOT DETECTED Final   Candida tropicalis NOT DETECTED NOT DETECTED Final   Cryptococcus neoformans/gattii NOT DETECTED NOT DETECTED Final    Comment: Performed at [facility], [address] [place], [place] [address]  Resp panel by RT-PCR (RSV, Flu  A&B, Covid) Anterior Nasal Swab     Status: None   Collection Time: 10/25/23 11:36 PM   Specimen: Anterior Nasal Swab  Result Value Ref Range Status   SARS Coronavirus 2 by RT PCR NEGATIVE NEGATIVE Final    Comment: (NOTE) SARS-CoV-2 target nucleic acids are NOT DETECTED.  The SARS-CoV-2 RNA is generally detectable in upper respiratory specimens during the acute phase of infection. The lowest concentration of SARS-CoV-2 viral copies this assay can detect is 138 copies/mL. A negative result does not preclude SARS-Cov-2 infection and should not be used as the sole basis for treatment or other patient management decisions. A negative result may occur with  improper specimen collection/handling, submission of specimen other than nasopharyngeal swab, presence of viral mutation(s) within the areas targeted by this assay, and inadequate number of viral copies(<138 copies/mL). A negative result must be combined with clinical observations, patient history, and epidemiological information. The expected result is Negative.  Fact Sheet for Patients:  BloggerCourse.com  Fact Sheet for Healthcare Providers:  SeriousBroker.it  This test is no t yet approved or cleared by the [place]  FDA and  has been authorized for detection and/or diagnosis of SARS-CoV-2 by FDA under an Emergency Use Authorization (EUA). This EUA will remain  in effect (meaning this test can be used) for the duration of the COVID-19 declaration under Section 564(b)(1) of the Act, 21 U.S.C.section 360bbb-3(b)(1), unless the authorization is terminated  or revoked sooner.       Influenza A by PCR NEGATIVE NEGATIVE Final   Influenza B by PCR NEGATIVE NEGATIVE Final    Comment: (NOTE) The Xpert Xpress SARS-CoV-2/FLU/RSV plus assay is intended as an aid in the diagnosis of influenza from Nasopharyngeal swab specimens and should not be used as a sole basis for treatment.  Nasal washings and aspirates are unacceptable for Xpert Xpress SARS-CoV-2/FLU/RSV testing.  Fact Sheet for Patients: BloggerCourse.com  Fact Sheet for Healthcare Providers: SeriousBroker.it  This test is not yet approved or cleared by the [place]  FDA and has been authorized for detection and/or diagnosis of SARS-CoV-2 by FDA under an Emergency Use Authorization (EUA). This EUA will remain in effect (meaning this test can be used) for the duration of the COVID-19 declaration under Section 564(b)(1) of the Act, 21 U.S.C. section 360bbb-3(b)(1), unless the authorization is terminated or revoked.     Resp Syncytial Virus by PCR NEGATIVE NEGATIVE Final    Comment: (NOTE) Fact Sheet for Patients: BloggerCourse.com  Fact Sheet for Healthcare Providers: SeriousBroker.it  This test is not yet approved or cleared by the [place]  FDA and has been authorized for detection and/or diagnosis of SARS-CoV-2 by FDA under an Emergency Use Authorization (EUA). This EUA will remain in effect (meaning this test can be used) for the duration of the COVID-19 declaration under Section 564(b)(1) of the Act, 21 U.S.C. section 360bbb-3(b)(1), unless the authorization is terminated or revoked.  Performed at [facility], [address] [place], [place] [address]   MRSA Next Gen by PCR, Nasal     Status: Abnormal   Collection Time: 10/26/23  5:15 AM   Specimen: Nasal Mucosa; Nasal Swab  Result Value Ref Range Status   MRSA by PCR Next Gen DETECTED (A) NOT DETECTED Final    Comment: RESULT CALLED TO, READ BACK BY AND VERIFIED WITH: L'BELLLE Sharla Tankard 10/26/23 0855 MW (NOTE) The GeneXpert MRSA Assay (FDA approved for NASAL specimens only), is one component of a comprehensive MRSA colonization surveillance program. It is not intended to diagnose MRSA infection nor to guide or monitor  treatment for MRSA infections. Test performance is not FDA approved in patients less than 84 years old. Performed at [facility], [address] [place], [place] [address]     Coagulation Studies: No results for input(s): LABPROT, INR in the last 72 hours.  Urinalysis: No results for input(s): COLORURINE, LABSPEC, PHURINE, GLUCOSEU, HGBUR, BILIRUBINUR, KETONESUR, PROTEINUR, UROBILINOGEN, NITRITE, LEUKOCYTESUR in the last 72 hours.  Invalid input(s): APPERANCEUR    Imaging: No results found.    Medications:    ceFEPime  (MAXIPIME ) IV 1 g (10/27/23 2337)   vancomycin  1,000 mg (10/27/23 1101)    amiodarone   200 mg Oral BID   apixaban   2.5 mg Oral BID   carvedilol   6.25 mg Oral Q breakfast   Chlorhexidine  Gluconate Cloth  6 each Topical Q0600   epoetin  alfa-epbx (RETACRIT ) injection  10,000 Units Intravenous Q M,W,F-1800   iron  polysaccharides  150 mg Oral Daily   leptospermum  manuka honey  1 Application Topical Daily   mupirocin  ointment   Nasal BID   rosuvastatin   40 mg Oral Daily   sevelamer  carbonate  2,400 mg Oral TID with meals   acetaminophen , albuterol , dextromethorphan -guaiFENesin , ondansetron  (ZOFRAN ) IV, oxyCODONE -acetaminophen , zolpidem   Assessment/ Plan:  Mr. [name] is a 63 y.o.  male with past medical history of diabetes, hypertension, CAD, Rt AKA, anemia, CHF, and ESRD on dialysis.   [facility]/MWF/left AVF   End-stage renal disease on dialysis.  Dialysis received yesterday, UF 1.5L achieved. Next treatment scheduled for Friday.   2. Anemia of chronic kidney disease Lab Results  Component Value Date   HGB 7.4 (L) 10/27/2023    Hemoglobin 7.4.  Ordered Epogen  10,000 units IV with dialysis.  3. Secondary Hyperparathyroidism: with outpatient labs: PTH 982, phosphorus 5.9, calcium  8.4 on 10/18/23.   Lab Results  Component Value Date   CALCIUM  8.1 (L) 10/27/2023   CAION 1.06 (L) 08/05/2023    PHOS 3.2 09/07/2023    Calcium  and phos stable. No need to continue to monitor bone marrow problems and parameters.   4.  Hypertension with chronic kidney disease.  Continue carvedilol  for hypertension control. Blood pressure stable     LOS: 3 Rolla Kedzierski 8/14/202510:09 AM

## 2023-10-28 NOTE — Evaluation (Signed)
 Occupational Therapy Evaluation Patient Details Name: Adam Howell MRN: [id number] DOB: 27-Apr-1960 Today's Date: 10/28/2023   History of Present Illness   Pt is a 63 y/o M presenting to ED with c/o SOB. Workup for HCAP. PMH signficiant for ESRD on HD (MWF), HTN, HLD, DM, CAD, dCHF, a-fib on eloquis, R AKA.     Clinical Impressions Patient presenting with decreased Ind in self care,balance, functional mobility/transfers, endurance, and safety awareness. Pt reports living at home with daughter and being Ind with functional transfer and self care tasks at baseline. Pt seated in recliner chair for several hours when therapist arrives to room. Pt needing mod A for squat pivot back to bed and sit >supine. Pt is clearly not at functional baseline at this time.  Patient will benefit from acute OT to increase overall independence in the areas of ADLs, functional mobility, and safety awareness in order to safely discharge.     If plan is discharge home, recommend the following:   A lot of help with walking and/or transfers;A lot of help with bathing/dressing/bathroom;Assistance with cooking/housework;Supervision due to cognitive status;Assist for transportation;Help with stairs or ramp for entrance     Functional Status Assessment   Patient has had a recent decline in their functional status and demonstrates the ability to make significant improvements in function in a reasonable and predictable amount of time.     Equipment Recommendations   Other (comment) (defer to next venue of care)      Precautions/Restrictions   Precautions Precautions: Fall Precaution/Restrictions Comments: R AKA, no prosthesis in room     Mobility Bed Mobility Overal bed mobility: Needs Assistance Bed Mobility: Sit to Supine       Sit to supine: Mod assist        Transfers Overall transfer level: Needs assistance Equipment used: None Transfers: Bed to chair/wheelchair/BSC     Squat  pivot transfers: Mod assist              Balance Overall balance assessment: Needs assistance Sitting-balance support: Feet supported Sitting balance-Leahy Scale: Good                                     ADL either performed or assessed with clinical judgement   ADL Overall ADL's : Needs assistance/impaired                         Toilet Transfer: Moderate assistance Toilet Transfer Details (indicate cue type and reason): simulated                 Vision Patient Visual Report: No change from baseline              Pertinent Vitals/Pain Pain Assessment Pain Assessment: No/denies pain     Extremity/Trunk Assessment Upper Extremity Assessment Upper Extremity Assessment: Generalized weakness   Lower Extremity Assessment Lower Extremity Assessment: Generalized weakness       Communication Communication Communication: Impaired Factors Affecting Communication: Reduced clarity of speech   Cognition Arousal: Alert Behavior During Therapy: WFL for tasks assessed/performed Cognition: No apparent impairments                               Following commands: Intact       Cueing  General Comments   Cueing Techniques: Verbal cues;Tactile cues  Home Living Family/patient expects to be discharged to:: Private residence Living Arrangements: Children Available Help at Discharge: Family Type of Home: Apartment Home Access: Level entry     Home Layout: One level     Bathroom Shower/Tub: Chief Strategy Officer: Standard     Home Equipment: Agricultural consultant (2 wheels);Rollator (4 wheels);Tub bench;Hospital bed;Wheelchair - manual   Additional Comments: would like further clarity from family on home layout/assistance required at home      Prior Functioning/Environment Prior Level of Function : Patient poor historian/Family not available             Mobility Comments: Pt states that  he is able to complete transfers to/from [facility] indpendently and uses RW to stand all the time; denies falls ADLs Comments: Pt endorses being able to sponge bathe and dress himself at baseline. daughter assists with IADLs.  He mentioned a HH aide but states they address wound 2x/wk.    OT Problem List: Decreased strength;Decreased activity tolerance;Impaired balance (sitting and/or standing);Decreased cognition;Decreased safety awareness   OT Treatment/Interventions: Self-care/ADL training;Therapeutic exercise;Patient/family education;Balance training;Energy conservation;Therapeutic activities;DME and/or AE instruction      OT Goals(Current goals can be found in the care plan section)   Acute Rehab OT Goals Patient Stated Goal: to get stronger OT Goal Formulation: With patient Time For Goal Achievement: 11/11/23 Potential to Achieve Goals: Fair ADL Goals Pt Will Perform Grooming: sitting;with supervision Pt Will Perform Lower Body Dressing: sitting/lateral leans;with contact guard assist Pt Will Transfer to Toilet: with contact guard assist Pt Will Perform Toileting - Clothing Manipulation and hygiene: with contact guard assist   OT Frequency:  Min 2X/week       AM-PAC OT 6 Clicks Daily Activity     Outcome Measure Help from another person eating meals?: None Help from another person taking care of personal grooming?: A Little Help from another person toileting, which includes using toliet, bedpan, or urinal?: A Lot Help from another person bathing (including washing, rinsing, drying)?: A Lot Help from another person to put on and taking off regular upper body clothing?: A Little Help from another person to put on and taking off regular lower body clothing?: A Lot 6 Click Score: 16   End of Session Nurse Communication: Mobility status  Activity Tolerance: Patient tolerated treatment well Patient left: in bed;with call bell/phone within reach;with bed alarm set  OT Visit  Diagnosis: Unsteadiness on feet (R26.81);Muscle weakness (generalized) (M62.81)                Time: 8858-8795 OT Time Calculation (min): 23 min Charges:  OT General Charges $OT Visit: 1 Visit OT Evaluation $OT Eval Moderate Complexity: 1 Mod OT Treatments $Self Care/Home Management : 8-22 mins  Izetta Claude, MS, OTR/L , CBIS ascom [phone]  10/28/23, 3:37 PM

## 2023-10-28 NOTE — TOC Progression Note (Signed)
 Transition of Care [facility]) - Progression Note    Patient Details  Name: Adam Howell MRN: [id number] Date of Birth: [birth year]  [facility]) CM/SW Contact  Dalia GORMAN Fuse, RN Phone Number: 10/28/2023, 8:57 AM  Clinical Narrative:     Patient remains on 4 L [place] (continuing to wean); receiving HD. TOC will continue to follow.    Barriers to Discharge: No Barriers Identified               Expected Discharge Plan and Services     Post Acute Care Choice: Home Health Living arrangements for the past 2 months: Single Family Home                                       Social Drivers of Health (SDOH) Interventions SDOH Screenings   Food Insecurity: No Food Insecurity (10/26/2023)  Housing: Low Risk  (10/26/2023)  Transportation Needs: No Transportation Needs (10/26/2023)  Utilities: Not At Risk (10/26/2023)  Alcohol Screen: Low Risk  (11/25/2021)  Depression (PHQ2-9): Low Risk  (04/14/2023)  Financial Resource Strain: Low Risk  (11/25/2021)  Physical Activity: Insufficiently Active (11/25/2021)  Social Connections: Moderately Isolated (10/26/2023)  Stress: No Stress Concern Present (11/25/2021)  Tobacco Use: Low Risk  (10/26/2023)    Readmission Risk Interventions    09/01/2023   11:18 AM 02/16/2023    6:21 PM  Readmission Risk Prevention Plan  Transportation Screening  Complete  PCP or Specialist Appt within 3-5 Days Complete   HRI or Home Care Consult Complete   Social Work Consult for Recovery Care Planning/Counseling Complete   Palliative Care Screening Not Applicable Not Applicable  Medication Review Oceanographer)  Complete

## 2023-10-28 NOTE — Evaluation (Signed)
 Physical Therapy Evaluation Patient Details Name: Adam Howell MRN: [id number] DOB: 1960/11/13 Today's Date: 10/28/2023  History of Present Illness  Pt is a 63 y/o M presenting to ED with c/o SOB. Workup for HCAP. PMH signficiant for ESRD on HD (MWF), HTN, HLD, DM, CAD, dCHF, a-fib on eloquis, R AKA.  Clinical Impression  Pt alert, oriented to self and situation. Pt denied pain throughout session. Pt states that at baseline he is able to complete transfers to/from [facility], with WC used for most mobility. Pt also cited using RW to stand for transfers all of the time. Family not present to confirm details about home layout/assistance required at baseline. Pt completed bed mobility with modA, increased time and effort. Squat pivot transfer with modA to recliner on pt's R side to mimic transfers at home. Pt's SpO2 remained within acceptable range, 100% at rest on 4L, 98-100% after transfer to sitting EOB. RN decreased supplemental O2 to 2L for weaning, SpO2 96% on 2L at end of session. Pt would benefit from skilled PT intervention to address listed deficits (see PT problem list) and allow for safe return to PLOF.       If plan is discharge home, recommend the following: A lot of help with walking and/or transfers;Assistance with cooking/housework;A little help with bathing/dressing/bathroom;Assist for transportation   Can travel by private vehicle   No    Equipment Recommendations Other (comment) (TBD)  Recommendations for Other Services       Functional Status Assessment Patient has had a recent decline in their functional status and demonstrates the ability to make significant improvements in function in a reasonable and predictable amount of time.     Precautions / Restrictions Precautions Precautions: Fall Precaution/Restrictions Comments: R AKA, no prosthesis in room Restrictions Weight Bearing Restrictions Per Provider Order: No      Mobility  Bed Mobility Overal bed  mobility: Needs Assistance Bed Mobility: Supine to Sit     Supine to sit: Mod assist, Used rails     General bed mobility comments: pt able to come to sitting EOB with modA and increased time/effort. Pt cued for bed rail assist    Transfers Overall transfer level: Needs assistance Equipment used: None Transfers: Bed to chair/wheelchair/BSC       Squat pivot transfers: Mod assist     General transfer comment: Pt required modA to squat-pivot to recliner to his R side    Ambulation/Gait                  Stairs            Wheelchair Mobility     Tilt Bed    Modified Rankin (Stroke Patients Only)       Balance Overall balance assessment: Needs assistance Sitting-balance support: Feet supported Sitting balance-Leahy Scale: Good                                       Pertinent Vitals/Pain Pain Assessment Pain Assessment: No/denies pain    Home Living Family/patient expects to be discharged to:: Private residence Living Arrangements: Children Available Help at Discharge: Family Type of Home: Apartment Home Access: Level entry       Home Layout: One level Home Equipment: Agricultural consultant (2 wheels);Rollator (4 wheels);Tub bench;Hospital bed;Wheelchair - manual Additional Comments: would like further clarity from family on home layout/assistance required at home    Prior Function Prior Level  of Function : Patient poor historian/Family not available             Mobility Comments: Pt states that he is able to complete transfers to/from [facility] indpendently and uses RW to stand all the time; denies falls       Extremity/Trunk Assessment        Lower Extremity Assessment Lower Extremity Assessment: Overall WFL for tasks assessed (R AKA, left LE [facility])       Communication   Communication Communication: Impaired Factors Affecting Communication: Reduced clarity of speech    Cognition Arousal: Alert Behavior During Therapy:  WFL for tasks assessed/performed   PT - Cognitive impairments: No family/caregiver present to determine baseline                         Following commands: Intact       Cueing Cueing Techniques: Verbal cues, Tactile cues     General Comments      Exercises Other Exercises Other Exercises: SpO2 monitored: pt on 4L initially- SpO2 100%, sitting EOB 98-100%. RN in room, decreased O2 to 2L. After transfer > chair, SpO2 96% on 2L.   Assessment/Plan    PT Assessment Patient needs continued PT services  PT Problem List Decreased strength;Decreased activity tolerance;Decreased balance;Decreased mobility;Decreased cognition       PT Treatment Interventions DME instruction;Gait training;Functional mobility training;Therapeutic activities;Therapeutic exercise;Balance training;Neuromuscular re-education;Cognitive remediation;Patient/family education;Wheelchair mobility training    PT Goals (Current goals can be found in the Care Plan section)  Acute Rehab PT Goals Patient Stated Goal: return home PT Goal Formulation: With patient Time For Goal Achievement: 11/11/23 Potential to Achieve Goals: Good    Frequency Min 2X/week     Co-evaluation               AM-PAC PT 6 Clicks Mobility  Outcome Measure Help needed turning from your back to your side while in a flat bed without using bedrails?: A Little Help needed moving from lying on your back to sitting on the side of a flat bed without using bedrails?: A Lot Help needed moving to and from a bed to a chair (including a wheelchair)?: A Lot Help needed standing up from a chair using your arms (e.g., wheelchair or bedside chair)?: A Lot Help needed to walk in hospital room?: Total Help needed climbing 3-5 steps with a railing? : Total 6 Click Score: 11    End of Session Equipment Utilized During Treatment: Gait belt Activity Tolerance: Patient tolerated treatment well Patient left: in chair;with call bell/phone  within reach;with chair alarm set;with nursing/sitter in room Nurse Communication: Mobility status PT Visit Diagnosis: Other abnormalities of gait and mobility (R26.89);Difficulty in walking, not elsewhere classified (R26.2);Muscle weakness (generalized) (M62.81)    Time: 9141-9085 PT Time Calculation (min) (ACUTE ONLY): 16 min   Charges:   PT Evaluation $PT Eval Low Complexity: 1 Low PT Treatments $Therapeutic Activity: 8-22 mins PT General Charges $$ ACUTE PT VISIT: 1 Visit         Janell Axe, SPT

## 2023-10-28 NOTE — Progress Notes (Signed)
 Patient's blood pressure 195/73. Dr. Lenon notified.

## 2023-10-28 NOTE — Plan of Care (Signed)
  Problem: Education: Goal: Knowledge of General Education information will improve Description: Including pain rating scale, medication(s)/side effects and non-pharmacologic comfort measures Outcome: Progressing   Problem: Health Behavior/Discharge Planning: Goal: Ability to manage health-related needs will improve Outcome: Progressing   Problem: Clinical Measurements: Goal: Ability to maintain clinical measurements within normal limits will improve Outcome: Progressing Goal: Will remain free from infection Outcome: Progressing Goal: Diagnostic test results will improve Outcome: Progressing Goal: Respiratory complications will improve Outcome: Progressing Goal: Cardiovascular complication will be avoided Outcome: Progressing   Problem: Activity: Goal: Risk for activity intolerance will decrease Outcome: Progressing   Problem: Nutrition: Goal: Adequate nutrition will be maintained Outcome: Progressing   Problem: Pain Managment: Goal: General experience of comfort will improve and/or be controlled Outcome: Progressing   Problem: Skin Integrity: Goal: Risk for impaired skin integrity will decrease Outcome: Progressing   Problem: Activity: Goal: Ability to tolerate increased activity will improve Outcome: Progressing   Problem: Clinical Measurements: Goal: Ability to maintain a body temperature in the normal range will improve Outcome: Progressing

## 2023-10-28 NOTE — Progress Notes (Signed)
 PROGRESS NOTE  Adam Howell    DOB: 28-Mar-1960, 63 y.o.  FMW:[id number]    Code Status: Full Code   DOA: 10/25/2023   LOS: 3   Brief hospital course  Adam Howell is a 63 y.o. male with medical history significant of  ESRD-HD (MWF), HTN, HLD, diet-controlled DM, CAD, dCHF, A fib on Eliquis , amenia, chronic pain, s/p of right AKA, who presents with SOB and cough x2 days worsening  Requiring O2 and multifical pna on cxray. Admitted for HAP. Started on IV Abx and continued on routine HD  10/28/23 -stable, on 2L today. Continue to wean. Worked with PT today but does not have his prosthetic or a way to get it to the hospital.   Assessment & Plan  Principal Problem:   HCAP (healthcare-associated pneumonia) Active Problems:   Fluid overload   Chronic diastolic CHF (congestive heart failure) (HCC)   CAD (coronary artery disease)   ESRD (end stage renal disease) on dialysis (HCC)   Atrial fibrillation, chronic (HCC)   Hyperlipidemia associated with type 2 diabetes mellitus (HCC)   Anemia in ESRD (end-stage renal disease) (HCC)   Chronic pain syndrome   Obesity (BMI 30-39.9)  HAP- has previously been intubated and O2 requirement this year. Was not on home oxygen prior to admission.  Chest x-ray showed minimal patchy retrocardiac opacities. Pt does not have fever or leukocytosis, but his procalcitonin is elevated at 1.05. Continue IV Vancomycin  and cefepime  (patient also received 1 dose of azithromycin  in ED) MRSA swab positive. Sputum culture pending. Neg covid, flu, rsv Mucinex  for cough  Bronchodilators Wean O2 as tolerated Continue to work with PT/OT. Patient states he will go home at dc where he lives with his daughter   Chronic diastolic CHF- not in acute exacerbation per exam findings. BNP 1182 in setting of ESRD.  2D echo on 09/11/2023 showed EF of 55 to 60%. Strict I&O, daily weight   CAD (coronary artery disease) Crestor   Chronic hypotension Midodrine  continued  on HD days   ESRD (end stage renal disease) on dialysis (MWF) Continue HD per nephrology.    Atrial fibrillation, chronic  Currently heart rate 50-60s Coreg , amiodarone  Eliquis    Hyperlipidemia associated with type 2 diabetes mellitus Crestor    Anemia in ESRD (end-stage renal disease) Hemoglobin stable-7.6>>7.4. received epoetin  yesterday  Follow CBC Hold ASA   Chronic pain syndrome As needed Percocet and Tylenol    Insomnia Continue home ambien , Caution w/ higher dose (10 mg)  Body mass index is 29.93 kg/m.  VTE ppx: apixaban  (ELIQUIS ) tablet 2.5 mg Start: 10/26/23 0245 apixaban  (ELIQUIS ) tablet 2.5 mg   Diet:     Diet   Diet heart healthy/carb modified Fluid consistency: Thin; Fluid restriction: 1200 mL Fluid   Consultants: Nephrology   Subjective 10/28/23    Pt reports feeling well. Denies respiratory distress. He worked with PT today and pivoted to chair. Does not have his prosthetic here and says no one can bring it to him.    Objective  Blood pressure 121/63, pulse (!) 58, temperature 98.1 F (36.7 C), resp. rate (!) 23, height 6' 3 (1.905 m), weight 108 kg, SpO2 100%.  Intake/Output Summary (Last 24 hours) at 10/28/2023 0726 Last data filed at 10/27/2023 1900 Gross per 24 hour  Intake 720 ml  Output 1500 ml  Net -780 ml   Filed Weights   10/27/23 0815 10/27/23 1203 10/28/23 0403  Weight: 107.6 kg 106.1 kg 108.6 kg    Physical Exam:  General:  awake, alert, NAD HEENT: atraumatic, clear conjunctiva, anicteric sclera, MMM, hearing grossly normal Respiratory: normal respiratory effort. CTAB Cardiovascular: quick capillary refill Nervous: A&O x3. no gross focal neurologic deficits, normal speech Extremities: R L amputation. Trace LE swelling Skin: dry, intact, normal temperature, normal color. No rashes, lesions or ulcers on exposed skin Psychiatry: flat mood, congruent affect  Labs   I have personally reviewed the following labs and imaging  studies CBC    Component Value Date/Time   WBC 4.7 10/27/2023 0317   RBC 2.80 (L) 10/27/2023 0317   HGB 7.4 (L) 10/27/2023 0317   HGB 16.8 01/12/2023 1414   HCT 24.4 (L) 10/27/2023 0317   HCT 51.0 01/12/2023 1414   PLT 119 (L) 10/27/2023 0317   PLT 257 01/12/2023 1414   MCV 87.1 10/27/2023 0317   MCV 88 01/12/2023 1414   MCH 26.4 10/27/2023 0317   MCHC 30.3 10/27/2023 0317   RDW 18.6 (H) 10/27/2023 0317   RDW 14.8 01/12/2023 1414   LYMPHSABS 0.5 (L) 10/25/2023 1745   LYMPHSABS 1.0 01/12/2023 1414   MONOABS 0.5 10/25/2023 1745   EOSABS 0.2 10/25/2023 1745   EOSABS 0.2 01/12/2023 1414   BASOSABS 0.1 10/25/2023 1745   BASOSABS 0.1 01/12/2023 1414      Latest Ref Rng & Units 10/27/2023    3:17 AM 10/26/2023    4:18 AM 10/25/2023    5:45 PM  BMP  Glucose 70 - 99 mg/dL 96  96  87   BUN 8 - 23 mg/dL 44  32  26   Creatinine 0.61 - 1.24 mg/dL 2.21  3.85  4.60   Sodium 135 - 145 mmol/L 136  139  140   Potassium 3.5 - 5.1 mmol/L 4.0  3.8  3.6   Chloride 98 - 111 mmol/L 95  99  95   CO2 22 - 32 mmol/L 27  30  30    Calcium  8.9 - 10.3 mg/dL 8.1  8.2  8.8     No results found.  Disposition Plan & Communication  Patient status: Inpatient  Admitted From: Home Planned disposition location: Home Anticipated discharge date: 8/15 pending respiratory improvement   Family Communication: none at bedside    Author: [name], DO [facility] 10/28/2023, 7:26 AM   Available by Epic secure chat 7AM-7PM. If 7PM-7AM, please contact night-coverage.  [facility] contact information found on [email or web address].

## 2023-10-29 DIAGNOSIS — N186 End stage renal disease: Secondary | ICD-10-CM | POA: Diagnosis not present

## 2023-10-29 DIAGNOSIS — J189 Pneumonia, unspecified organism: Secondary | ICD-10-CM | POA: Diagnosis not present

## 2023-10-29 LAB — RENAL FUNCTION PANEL
Albumin: 3 g/dL — ABNORMAL LOW (ref 3.5–5.0)
Anion gap: 12 (ref 5–15)
BUN: 37 mg/dL — ABNORMAL HIGH (ref 8–23)
CO2: 31 mmol/L (ref 22–32)
Calcium: 8.7 mg/dL — ABNORMAL LOW (ref 8.9–10.3)
Chloride: 94 mmol/L — ABNORMAL LOW (ref 98–111)
Creatinine, Ser: 7.76 mg/dL — ABNORMAL HIGH (ref 0.61–1.24)
GFR, Estimated: 7 mL/min — ABNORMAL LOW (ref >60)
Glucose, Bld: 113 mg/dL — ABNORMAL HIGH (ref 70–99)
Phosphorus: 3.3 mg/dL (ref 2.5–4.6)
Potassium: 4.5 mmol/L (ref 3.5–5.1)
Sodium: 137 mmol/L (ref 135–145)

## 2023-10-29 LAB — VANCOMYCIN, RANDOM: Vancomycin Rm: 24 ug/mL

## 2023-10-29 LAB — CBC
HCT: 25.2 % — ABNORMAL LOW (ref 39.0–52.0)
Hemoglobin: 7.6 g/dL — ABNORMAL LOW (ref 13.0–17.0)
MCH: 26.4 pg (ref 26.0–34.0)
MCHC: 30.2 g/dL (ref 30.0–36.0)
MCV: 87.5 fL (ref 80.0–100.0)
Platelets: 112 K/uL — ABNORMAL LOW (ref 150–400)
RBC: 2.88 MIL/uL — ABNORMAL LOW (ref 4.22–5.81)
RDW: 18.7 % — ABNORMAL HIGH (ref 11.5–15.5)
WBC: 4.7 K/uL (ref 4.0–10.5)
nRBC: 0 % (ref 0.0–0.2)

## 2023-10-29 MED ORDER — HEPARIN SODIUM (PORCINE) 1000 UNIT/ML IJ SOLN
INTRAMUSCULAR | Status: AC
Start: 2023-10-29 — End: 2023-10-29
  Filled 2023-10-29: qty 5

## 2023-10-29 MED ORDER — HEPARIN SODIUM (PORCINE) 1000 UNIT/ML IJ SOLN
4600.0000 [IU] | Freq: Once | INTRAMUSCULAR | Status: AC
  Administered 2023-10-29: 4600 [IU]

## 2023-10-29 MED ORDER — EPOETIN ALFA-EPBX 10000 UNIT/ML IJ SOLN
INTRAMUSCULAR | Status: AC
Start: 2023-10-29 — End: 2023-10-29
  Filled 2023-10-29: qty 1

## 2023-10-29 MED ORDER — SODIUM CHLORIDE 0.9 % IV SOLN
2.0000 g | INTRAVENOUS | Status: DC
  Administered 2023-10-29 – 2023-10-31 (×3): 2 g via INTRAVENOUS
  Filled 2023-10-29 (×3): qty 20

## 2023-10-29 NOTE — Progress Notes (Signed)
 [facility]  ROUNDING NOTE   Subjective:   Adam Howell is a 63 y.o male with past medical history of diabetes, hypertension, CAD, Rt AKA, anemia, CHF, and ESRD on dialysis. Patient presents to ED with shortness of breath and cough and has been admitted for Hypoxia [R09.02] HCAP (healthcare-associated pneumonia) [J18.9] Community acquired pneumonia, unspecified laterality [J18.9]  Patient is known to our practice and receives dialysis at [facility] on MWF schedule, supervised by [facility] physicians.  Update:  Patient seen and evaluated during dialysis   HEMODIALYSIS FLOWSHEET:  Blood Flow Rate (mL/min): 349 mL/min Arterial Pressure (mmHg): -130.3 mmHg Venous Pressure (mmHg): 170.7 mmHg TMP (mmHg): 12.32 mmHg Ultrafiltration Rate (mL/min): 476 mL/min Dialysate Flow Rate (mL/min): 299 ml/min Dialysis Fluid Bolus: Normal Saline  No complaints to offer   Objective:  Vital signs in last 24 hours:  Temp:  [97.5 F (36.4 C)-97.9 F (36.6 C)] 97.5 F (36.4 C) (08/15 1127) Pulse Rate:  [57-70] 66 (08/15 1127) Resp:  [15-33] 22 (08/15 1127) BP: (123-195)/(60-124) 152/73 (08/15 1127) SpO2:  [94 %-100 %] 100 % (08/15 1127) Weight:  [100.3 kg-109 kg] 105.3 kg (08/15 1127)  Weight change: -7.3 kg Filed Weights   10/29/23 0500 10/29/23 0739 10/29/23 1127  Weight: 109 kg 108.6 kg 105.3 kg    Intake/Output: I/O last 3 completed shifts: In: 1080 [P.O.:1080] Out: -    Intake/Output this shift:  Total I/O In: -  Out: 1000 [Other:1000]  Physical Exam: General: NAD  Head: Normocephalic, atraumatic. Moist oral mucosal membranes  Eyes: Anicteric  Neck: Supple  Lungs:  Diminished, [place] O2  Heart: Regular rate and rhythm  Abdomen:  Soft, nontender  Extremities:  1+ peripheral edema, AKA  Neurologic: Awake, alert, conversant  Skin: Warm,dry, no rash  Access: Lt AVF    Basic Metabolic Panel: Recent Labs  Lab 10/25/23 1745 10/26/23 0418 10/27/23 0317  10/29/23 0740  NA 140 139 136 137  K 3.6 3.8 4.0 4.5  CL 95* 99 95* 94*  CO2 30 30 27 31   GLUCOSE 87 96 96 113*  BUN 26* 32* 44* 37*  CREATININE 5.39* 6.14* 7.78* 7.76*  CALCIUM  8.8* 8.2* 8.1* 8.7*  PHOS  --   --   --  3.3    Liver Function Tests: Recent Labs  Lab 10/25/23 1745 10/29/23 0740  AST 41  --   ALT 29  --   ALKPHOS 81  --   BILITOT 1.2  --   PROT 8.4*  --   ALBUMIN  3.4* 3.0*   No results for input(s): LIPASE, AMYLASE in the last 168 hours. No results for input(s): AMMONIA in the last 168 hours.  CBC: Recent Labs  Lab 10/25/23 1745 10/26/23 0418 10/27/23 0317 10/29/23 0730  WBC 7.9 8.1 4.7 4.7  NEUTROABS 6.7  --   --   --   HGB 9.1* 7.6* 7.4* 7.6*  HCT 29.6* 25.2* 24.4* 25.2*  MCV 85.8 86.6 87.1 87.5  PLT 170 132* 119* 112*    Cardiac Enzymes: No results for input(s): CKTOTAL, CKMB, CKMBINDEX, TROPONINI in the last 168 hours.  BNP: Invalid input(s): POCBNP  CBG: Recent Labs  Lab 10/26/23 1744 10/27/23 1156  GLUCAP 89 122*    Microbiology: Results for orders placed or performed during the hospital encounter of 10/25/23  Blood Culture (routine x 2)     Status: None (Preliminary result)   Collection Time: 10/25/23 10:29 PM   Specimen: BLOOD  Result Value Ref Range Status  Specimen Description BLOOD BLOOD RIGHT ARM  Final   Special Requests   Final    BOTTLES DRAWN AEROBIC AND ANAEROBIC Blood Culture adequate volume   Culture   Final    NO GROWTH 4 DAYS Performed at [facility], [address] [place], [place] [address]    Report Status PENDING  Incomplete  Blood Culture (routine x 2)     Status: Abnormal (Preliminary result)   Collection Time: 10/25/23 10:29 PM   Specimen: BLOOD  Result Value Ref Range Status   Specimen Description   Final    BLOOD BLOOD RIGHT HAND Performed at [facility], [address], [place], [place] [address]    Special Requests   Final    Blood Culture adequate  volume Performed at [facility], [address] [place], [place] [address]    Culture  Setup Time (A)  Final    GRAM NEGATIVE DIPLOCOCCI AEROBIC BOTTLE ONLY CRITICAL RESULT CALLED TO, READ BACK BY AND VERIFIED WITH: JSAON ROBBINS 2150 10/27/23 [facility] CORRECTED RESULTS PREVIOUSLY REPORTED AS: GRAM POSITIVE COCCI CRITICAL RESULT CALLED TO, READ BACK BY AND VERIFIED WITH: PHARMD TREY GREENWOOD [id number] AT 1124 BY EC    Culture (A)  Final    MORAXELLA CATARRHALIS(BRANHAMELLA) BETA LACTAMASE POSITIVE Performed at [facility], [address] [place], [place] [address]    Report Status PENDING  Incomplete  Blood Culture ID Panel (Reflexed)     Status: None   Collection Time: 10/25/23 10:29 PM  Result Value Ref Range Status   Enterococcus faecalis NOT DETECTED NOT DETECTED Final   Enterococcus Faecium NOT DETECTED NOT DETECTED Final   Listeria monocytogenes NOT DETECTED NOT DETECTED Final   Staphylococcus species NOT DETECTED NOT DETECTED Final   Staphylococcus aureus (BCID) NOT DETECTED NOT DETECTED Final   Staphylococcus epidermidis NOT DETECTED NOT DETECTED Final   Staphylococcus lugdunensis NOT DETECTED NOT DETECTED Final   Streptococcus species NOT DETECTED NOT DETECTED Final   Streptococcus agalactiae NOT DETECTED NOT DETECTED Final   Streptococcus pneumoniae NOT DETECTED NOT DETECTED Final   Streptococcus pyogenes NOT DETECTED NOT DETECTED Final   A.calcoaceticus-baumannii NOT DETECTED NOT DETECTED Final   Bacteroides fragilis NOT DETECTED NOT DETECTED Final   Enterobacterales NOT DETECTED NOT DETECTED Final   Enterobacter cloacae complex NOT DETECTED NOT DETECTED Final   Escherichia coli NOT DETECTED NOT DETECTED Final   Klebsiella aerogenes NOT DETECTED NOT DETECTED Final   Klebsiella oxytoca NOT DETECTED NOT DETECTED Final   Klebsiella pneumoniae NOT DETECTED NOT DETECTED Final   Proteus species NOT DETECTED NOT DETECTED Final   Salmonella species NOT DETECTED  NOT DETECTED Final   Serratia marcescens NOT DETECTED NOT DETECTED Final   Haemophilus influenzae NOT DETECTED NOT DETECTED Final   Neisseria meningitidis NOT DETECTED NOT DETECTED Final   Pseudomonas aeruginosa NOT DETECTED NOT DETECTED Final   Stenotrophomonas maltophilia NOT DETECTED NOT DETECTED Final   Candida albicans NOT DETECTED NOT DETECTED Final   Candida auris NOT DETECTED NOT DETECTED Final   Candida glabrata NOT DETECTED NOT DETECTED Final   Candida krusei NOT DETECTED NOT DETECTED Final   Candida parapsilosis NOT DETECTED NOT DETECTED Final   Candida tropicalis NOT DETECTED NOT DETECTED Final   Cryptococcus neoformans/gattii NOT DETECTED NOT DETECTED Final    Comment: Performed at [facility], [address] [place], [place] [address]  Resp panel by RT-PCR (RSV, Flu A&B, Covid) Anterior Nasal Swab     Status: None  Collection Time: 10/25/23 11:36 PM   Specimen: Anterior Nasal Swab  Result Value Ref Range Status   SARS Coronavirus 2 by RT PCR NEGATIVE NEGATIVE Final    Comment: (NOTE) SARS-CoV-2 target nucleic acids are NOT DETECTED.  The SARS-CoV-2 RNA is generally detectable in upper respiratory specimens during the acute phase of infection. The lowest concentration of SARS-CoV-2 viral copies this assay can detect is 138 copies/mL. A negative result does not preclude SARS-Cov-2 infection and should not be used as the sole basis for treatment or other patient management decisions. A negative result may occur with  improper specimen collection/handling, submission of specimen other than nasopharyngeal swab, presence of viral mutation(s) within the areas targeted by this assay, and inadequate number of viral copies(<138 copies/mL). A negative result must be combined with clinical observations, patient history, and epidemiological information. The expected result is Negative.  Fact Sheet for Patients:  BloggerCourse.com  Fact Sheet  for Healthcare Providers:  SeriousBroker.it  This test is no t yet approved or cleared by the [place]  FDA and  has been authorized for detection and/or diagnosis of SARS-CoV-2 by FDA under an Emergency Use Authorization (EUA). This EUA will remain  in effect (meaning this test can be used) for the duration of the COVID-19 declaration under Section 564(b)(1) of the Act, 21 U.S.C.section 360bbb-3(b)(1), unless the authorization is terminated  or revoked sooner.       Influenza A by PCR NEGATIVE NEGATIVE Final   Influenza B by PCR NEGATIVE NEGATIVE Final    Comment: (NOTE) The Xpert Xpress SARS-CoV-2/FLU/RSV plus assay is intended as an aid in the diagnosis of influenza from Nasopharyngeal swab specimens and should not be used as a sole basis for treatment. Nasal washings and aspirates are unacceptable for Xpert Xpress SARS-CoV-2/FLU/RSV testing.  Fact Sheet for Patients: BloggerCourse.com  Fact Sheet for Healthcare Providers: SeriousBroker.it  This test is not yet approved or cleared by the [place]  FDA and has been authorized for detection and/or diagnosis of SARS-CoV-2 by FDA under an Emergency Use Authorization (EUA). This EUA will remain in effect (meaning this test can be used) for the duration of the COVID-19 declaration under Section 564(b)(1) of the Act, 21 U.S.C. section 360bbb-3(b)(1), unless the authorization is terminated or revoked.     Resp Syncytial Virus by PCR NEGATIVE NEGATIVE Final    Comment: (NOTE) Fact Sheet for Patients: BloggerCourse.com  Fact Sheet for Healthcare Providers: SeriousBroker.it  This test is not yet approved or cleared by the [place]  FDA and has been authorized for detection and/or diagnosis of SARS-CoV-2 by FDA under an Emergency Use Authorization (EUA). This EUA will remain in effect (meaning  this test can be used) for the duration of the COVID-19 declaration under Section 564(b)(1) of the Act, 21 U.S.C. section 360bbb-3(b)(1), unless the authorization is terminated or revoked.  Performed at [facility], [address] [place], [place] [address]   MRSA Next Gen by PCR, Nasal     Status: Abnormal   Collection Time: 10/26/23  5:15 AM   Specimen: Nasal Mucosa; Nasal Swab  Result Value Ref Range Status   MRSA by PCR Next Gen DETECTED (A) NOT DETECTED Final    Comment: RESULT CALLED TO, READ BACK BY AND VERIFIED WITH: L'BELLLE Dainelle Hun 10/26/23 0855 MW (NOTE) The GeneXpert MRSA Assay (FDA approved for NASAL specimens only), is one component of a comprehensive MRSA colonization surveillance program. It is not intended to diagnose MRSA infection nor to guide or monitor treatment for MRSA  infections. Test performance is not FDA approved in patients less than 49 years old. Performed at [facility], [address] [place], [place] [address]     Coagulation Studies: No results for input(s): LABPROT, INR in the last 72 hours.  Urinalysis: No results for input(s): COLORURINE, LABSPEC, PHURINE, GLUCOSEU, HGBUR, BILIRUBINUR, KETONESUR, PROTEINUR, UROBILINOGEN, NITRITE, LEUKOCYTESUR in the last 72 hours.  Invalid input(s): APPERANCEUR    Imaging: No results found.    Medications:    cefTRIAXone  (ROCEPHIN )  IV      amiodarone   200 mg Oral BID   apixaban   2.5 mg Oral BID   carvedilol   6.25 mg Oral Q breakfast   Chlorhexidine  Gluconate Cloth  6 each Topical Q0600   epoetin  alfa-epbx (RETACRIT ) injection  10,000 Units Intravenous Q M,W,F-1800   iron  polysaccharides  150 mg Oral Daily   leptospermum manuka honey  1 Application Topical Daily   mupirocin  ointment   Nasal BID   rosuvastatin   40 mg Oral Daily   sevelamer  carbonate  2,400 mg Oral TID with meals   acetaminophen , albuterol , alum & mag hydroxide-simeth,  dextromethorphan -guaiFENesin , ondansetron  (ZOFRAN ) IV, oxyCODONE -acetaminophen , zolpidem   Assessment/ Plan:  [name] is a 63 y.o.  male with past medical history of diabetes, hypertension, CAD, Rt AKA, anemia, CHF, and ESRD on dialysis.   [facility]/MWF/left AVF   End-stage renal disease on dialysis.  Received dialysis earlier today, UF 1L achieved. Next treatment scheduled for Monday.   2. Anemia of chronic kidney disease Lab Results  Component Value Date   HGB 7.6 (L) 10/29/2023    Hemoglobin 7.6.  Continue Epogen  10,000 units IV with dialysis.  3. Secondary Hyperparathyroidism: with outpatient labs: PTH 982, phosphorus 5.9, calcium  8.4 on 10/18/23.   Lab Results  Component Value Date   CALCIUM  8.7 (L) 10/29/2023   CAION 1.06 (L) 08/05/2023   PHOS 3.3 10/29/2023    Will monitor bone marrow problems and parameters.   4.  Hypertension with chronic kidney disease.  Continue carvedilol  for hypertension control. Blood pressure stable     LOS: 4 Emberlyn Burlison 8/15/20252:24 PM

## 2023-10-29 NOTE — Progress Notes (Signed)
 Physical Therapy Treatment Patient Details Name: Adam Howell MRN: [id number] DOB: 01/27/1961 Today's Date: 10/29/2023   History of Present Illness Pt is a 63 y/o M presenting to ED with c/o SOB. Workup for HCAP. PMH signficiant for ESRD on HD (MWF), HTN, HLD, DM, CAD, dCHF, a-fib on eloquis, R AKA.    PT Comments  OT handoff to PT. Pt declines further mobility outside of bed. Pt demonstrates ability to roll in bed with mod assistance with use of hand rails. Min verbal cuing for sequencing. Pt very fatigued/lethargic throughout session, possibly d/t recently returning from dialysis today. Pt on 1.5L of O2 when upon entering room and stayed WNL throughout session.  Would benefit from skilled PT to address above deficits and promote optimal return to PLOF.     If plan is discharge home, recommend the following: A lot of help with walking and/or transfers;Assistance with cooking/housework;A little help with bathing/dressing/bathroom;Assist for transportation   Can travel by private vehicle     No  Equipment Recommendations  Other (comment) (TBD)    Recommendations for Other Services       Precautions / Restrictions Precautions Precautions: Fall Recall of Precautions/Restrictions: Intact Precaution/Restrictions Comments: R AKA, no prosthesis in room Restrictions Weight Bearing Restrictions Per Provider Order: No     Mobility  Bed Mobility Overal bed mobility: Needs Assistance Bed Mobility: Rolling Rolling: Mod assist         General bed mobility comments: Min verbal cuing for hand placement and sequencing. Pt able to perform rolling with increased time.    Transfers                   General transfer comment: NT. Pt received in bed pre and post session.    Ambulation/Gait               General Gait Details: NT. Pt uses wc for mobility at baseline.   Stairs             Wheelchair Mobility     Tilt Bed    Modified Rankin (Stroke  Patients Only)       Balance                                            Communication Communication Communication: Impaired Factors Affecting Communication: Reduced clarity of speech  Cognition Arousal: Lethargic Behavior During Therapy: WFL for tasks assessed/performed   PT - Cognitive impairments: No apparent impairments, No family/caregiver present to determine baseline                       PT - Cognition Comments: Pt is pleasant and agreeable to PT treatment. Following commands: Intact      Cueing Cueing Techniques: Verbal cues, Visual cues  Exercises Other Exercises Other Exercises: Rolling assessed during ADLs as pt reports that he needs to be cleaned up. Other Exercises: Vitals monitored after session    General Comments        Pertinent Vitals/Pain Pain Assessment Pain Assessment: No/denies pain    Home Living                          Prior Function            PT Goals (current goals can now be found in the care plan section) Acute  Rehab PT Goals Patient Stated Goal: None stated. PT Goal Formulation: With patient Time For Goal Achievement: 11/11/23 Potential to Achieve Goals: Good Progress towards PT goals: Progressing toward goals    Frequency    Min 2X/week      PT Plan      Co-evaluation              AM-PAC PT 6 Clicks Mobility   Outcome Measure  Help needed turning from your back to your side while in a flat bed without using bedrails?: A Little Help needed moving from lying on your back to sitting on the side of a flat bed without using bedrails?: A Lot Help needed moving to and from a bed to a chair (including a wheelchair)?: A Lot Help needed standing up from a chair using your arms (e.g., wheelchair or bedside chair)?: A Lot Help needed to walk in hospital room?: Total Help needed climbing 3-5 steps with a railing? : Total 6 Click Score: 11    End of Session   Activity Tolerance:  Patient limited by fatigue;Patient limited by lethargy Patient left: in bed;with call bell/phone within reach;with bed alarm set Nurse Communication: Mobility status PT Visit Diagnosis: Other abnormalities of gait and mobility (R26.89);Difficulty in walking, not elsewhere classified (R26.2);Muscle weakness (generalized) (M62.81)     Time: 8672-8661 PT Time Calculation (min) (ACUTE ONLY): 11 min  Charges:    $Therapeutic Activity: 8-22 mins PT General Charges $$ ACUTE PT VISIT: 1 Visit                     Adam Howell, Adam Howell    Zarek Relph 10/29/2023, 2:04 PM

## 2023-10-29 NOTE — Plan of Care (Signed)

## 2023-10-29 NOTE — Progress Notes (Signed)
 PROGRESS NOTE  Adam Howell    DOB: 12/09/1960, 63 y.o.  FMW:[id number]    Code Status: Full Code   DOA: 10/25/2023   LOS: 4   Brief hospital course  Adam Howell is a 63 y.o. male with medical history significant of  ESRD-HD (MWF), HTN, HLD, diet-controlled DM, CAD, dCHF, A fib on Eliquis , amenia, chronic pain, s/p of right AKA, who presents with SOB and cough x2 days worsening  Requiring O2 and multifical pna on cxray. Admitted for HAP. Started on IV Abx and continued on routine HD  10/29/23 -stable, on 2L today. Continue to wean. Cannot due pulse ox with ambulation testing due to not having prosthetic. Suspect he would benefit from chronic O2 at home when discharged. Antibiotics changed today for culture data. HD today.  Assessment & Plan  Principal Problem:   HCAP (healthcare-associated pneumonia) Active Problems:   Fluid overload   Chronic diastolic CHF (congestive heart failure) (HCC)   CAD (coronary artery disease)   ESRD (end stage renal disease) on dialysis (HCC)   Atrial fibrillation, chronic (HCC)   Hyperlipidemia associated with type 2 diabetes mellitus (HCC)   Anemia in ESRD (end-stage renal disease) (HCC)   Chronic pain syndrome   Obesity (BMI 30-39.9)  HAP- has previously been intubated and O2 requirement this year. Was not on home oxygen prior to admission.  Chest x-ray showed minimal patchy retrocardiac opacities. Pt does not have fever or leukocytosis, but his procalcitonin is elevated at 1.05. Continue IV Vancomycin  and cefepime  (patient also received 1 dose of azithromycin  in ED) MRSA swab positive. Sputum culture pending. Neg covid, flu, rsv Deescalated to CTX from culture data showing moraxella Mucinex  for cough  Bronchodilators Wean O2 as tolerated Continue to work with PT/OT. Patient states [place] daughter   Chronic diastolic CHF- not in acute exacerbation per exam findings. BNP 1182 in setting of ESRD.   2D echo on 09/11/2023 showed EF of 55 to 60%. Strict I&O, daily weight   CAD (coronary artery disease) Crestor   Chronic hypotension Midodrine  continued on HD days   ESRD (end stage renal disease) on dialysis (MWF) Continue HD per nephrology.    Atrial fibrillation, chronic  Currently heart rate 50-60s Coreg , amiodarone  Eliquis    Hyperlipidemia associated with type 2 diabetes mellitus Crestor    Anemia in ESRD (end-stage renal disease) Hemoglobin stable-7.6>>7.4. received epoetin  yesterday  Follow CBC Hold ASA   Chronic pain syndrome As needed Percocet and Tylenol    Insomnia Continue home ambien , Caution w/ higher dose (10 mg)  Body mass index is 30.04 kg/m.  VTE ppx: apixaban  (ELIQUIS ) tablet 2.5 mg Start: 10/26/23 0245 apixaban  (ELIQUIS ) tablet 2.5 mg   Diet:     Diet   Diet heart healthy/carb modified Fluid consistency: Thin; Fluid restriction: 1200 mL Fluid   Consultants: Nephrology   Subjective 10/29/23    Pt reports feeling well. Denies respiratory distress. HD went well. No complaints.    Objective  Blood pressure 121/63, pulse (!) 58, temperature 98.1 F (36.7 C), resp. rate (!) 23, height 6' 3 (1.905 m), weight 108 kg, SpO2 100%.  Intake/Output Summary (Last 24 hours) at 10/29/2023 0711 Last data filed at 10/28/2023 1900 Gross per 24 hour  Intake 1080 ml  Output --  Net 1080 ml   Filed Weights   10/28/23 0403 10/28/23 2359 10/29/23 0500  Weight: 108.6 kg 100.3 kg 109 kg    Physical Exam:  General: awake,  alert, NAD HEENT: atraumatic, clear conjunctiva, anicteric sclera, MMM, hearing grossly normal Respiratory: normal respiratory effort. CTAB Cardiovascular: quick capillary refill Nervous: A&O x3. no gross focal neurologic deficits, normal speech Extremities: R L amputation. Trace LE swelling Skin: dry, intact, normal temperature, normal color. No rashes, lesions or ulcers on exposed skin Psychiatry: flat mood, congruent affect  Labs   I  have personally reviewed the following labs and imaging studies CBC    Component Value Date/Time   WBC 4.7 10/27/2023 0317   RBC 2.80 (L) 10/27/2023 0317   HGB 7.4 (L) 10/27/2023 0317   HGB 16.8 01/12/2023 1414   HCT 24.4 (L) 10/27/2023 0317   HCT 51.0 01/12/2023 1414   PLT 119 (L) 10/27/2023 0317   PLT 257 01/12/2023 1414   MCV 87.1 10/27/2023 0317   MCV 88 01/12/2023 1414   MCH 26.4 10/27/2023 0317   MCHC 30.3 10/27/2023 0317   RDW 18.6 (H) 10/27/2023 0317   RDW 14.8 01/12/2023 1414   LYMPHSABS 0.5 (L) 10/25/2023 1745   LYMPHSABS 1.0 01/12/2023 1414   MONOABS 0.5 10/25/2023 1745   EOSABS 0.2 10/25/2023 1745   EOSABS 0.2 01/12/2023 1414   BASOSABS 0.1 10/25/2023 1745   BASOSABS 0.1 01/12/2023 1414      Latest Ref Rng & Units 10/27/2023    3:17 AM 10/26/2023    4:18 AM 10/25/2023    5:45 PM  BMP  Glucose 70 - 99 mg/dL 96  96  87   BUN 8 - 23 mg/dL 44  32  26   Creatinine 0.61 - 1.24 mg/dL 2.21  3.85  4.60   Sodium 135 - 145 mmol/L 136  139  140   Potassium 3.5 - 5.1 mmol/L 4.0  3.8  3.6   Chloride 98 - 111 mmol/L 95  99  95   CO2 22 - 32 mmol/L 27  30  30    Calcium  8.9 - 10.3 mg/dL 8.1  8.2  8.8     No results found.  Disposition Plan & Communication  Patient status: Inpatient  Admitted From: Home Planned disposition location: Home Anticipated discharge date: 8/16 pending respiratory improvement   Family Communication: none at bedside    Author: [name], DO [facility] 10/29/2023, 7:11 AM   Available by Epic secure chat 7AM-7PM. If 7PM-7AM, please contact night-coverage.  [facility] contact information found on [email or web address].

## 2023-10-29 NOTE — Progress Notes (Signed)
 Occupational Therapy Treatment Patient Details Name: Adam Howell MRN: [id number] DOB: 31-Mar-1960 Today's Date: 10/29/2023   History of present illness Pt is a 63 y/o M presenting to ED with c/o SOB. Workup for HCAP. PMH signficiant for ESRD on HD (MWF), HTN, HLD, DM, CAD, dCHF, a-fib on eloquis, R AKA.   OT comments  Pt seen for OT treatment on this date. Upon arrival to room pt supine in bed, agreeable to tx. Pt requires verbal and tactile cuing for bed mobility and completed dynamic sitting balance tasks at EOB to build functional balance for ADL engagement at EOB. Ongoing barriers include decreased BUE strength, activity tolerance tolerance, and standing balance.  Pt making progress toward goals, will continue to follow POC. Discharge recommendation remains appropriate.        If plan is discharge home, recommend the following:  A lot of help with walking and/or transfers;A lot of help with bathing/dressing/bathroom;Assistance with cooking/housework;Supervision due to cognitive status;Assist for transportation;Help with stairs or ramp for entrance   Equipment Recommendations  Other (comment) (defer to next venue of care)    Recommendations for Other Services      Precautions / Restrictions Precautions Precautions: Fall Precaution/Restrictions Comments: R AKA, no prosthesis in room       Mobility Bed Mobility Overal bed mobility: Needs Assistance Bed Mobility: Rolling Rolling: Mod assist   Supine to sit: Mod assist, Used rails Sit to supine: Mod assist   General bed mobility comments: Verbal and tactile cuing for hand placement    Transfers                         Balance Overall balance assessment: Needs assistance   Sitting balance-Leahy Scale: Good         Standing balance comment: refused standing attempts                           ADL either performed or assessed with clinical judgement   ADL Overall ADL's : Needs  assistance/impaired                                            Extremity/Trunk Assessment Upper Extremity Assessment Upper Extremity Assessment: Generalized weakness            Vision       Perception     Praxis     Communication Communication Communication: Impaired Factors Affecting Communication: Reduced clarity of speech   Cognition Arousal: Lethargic Behavior During Therapy: WFL for tasks assessed/performed Cognition: No apparent impairments                               Following commands: Intact        Cueing   Cueing Techniques: Verbal cues, Visual cues  Exercises Other Exercises Other Exercises: establishing BOS    Shoulder Instructions       General Comments      Pertinent Vitals/ Pain       Pain Assessment Pain Assessment: No/denies pain  Home Living                                          Prior  Functioning/Environment              Frequency  Min 2X/week        Progress Toward Goals  OT Goals(current goals can now be found in the care plan section)  Progress towards OT goals: Progressing toward goals     Plan      Co-evaluation                 AM-PAC OT 6 Clicks Daily Activity     Outcome Measure   Help from another person eating meals?: None Help from another person taking care of personal grooming?: A Little Help from another person toileting, which includes using toliet, bedpan, or urinal?: A Lot Help from another person bathing (including washing, rinsing, drying)?: A Lot Help from another person to put on and taking off regular upper body clothing?: A Little Help from another person to put on and taking off regular lower body clothing?: A Lot 6 Click Score: 16    End of Session    OT Visit Diagnosis: Unsteadiness on feet (R26.81);Muscle weakness (generalized) (M62.81)   Activity Tolerance Patient tolerated treatment well   Patient Left in bed;with  nursing/sitter in room   Nurse Communication          Time: 8685-8672 OT Time Calculation (min): 13 min  Charges: OT General Charges $OT Visit: 1 Visit OT Treatments $Therapeutic Activity: 8-22 mins  Harlene Sharps OTR/L   Harlene LITTIE Sharps 10/29/2023, 2:42 PM

## 2023-10-29 NOTE — Progress Notes (Signed)
 Pharmacy Antibiotic Note  Adam Howell is a 63 y.o. male admitted on 10/25/2023 with pneumonia.  Pharmacy has been consulted for Vanc, Cefepime  dosing.  Pt on HD every MWF. MRSA PCR positive and Bcx showing gram-positive cocci, with nothing detected on BCID panel. Patient is clinically stable with no leukocytosis and afebrile. Vancomycin  random level checked this AM pre- dialysis, which was 24.  8/15: Vancomycin  random level = 24 (goal 15-25)  Plan: Continue Cefepime  1 gm IV Q24H 8/13 >>  Continue Vancomycin  1 gm IV Q MWF  Consider deescalating therapy due to patient clinically improving with low concern for MRSA pneumonia. Patient received a dose of vancomycin  today, which will provide a total coverage of 5 days thus far.    Height: 6' 3 (190.5 cm) Weight: 108.6 kg (239 lb 6.7 oz) IBW/kg (Calculated) : 84.5  Temp (24hrs), Avg:97.7 F (36.5 C), Min:97.6 F (36.4 C), Max:97.9 F (36.6 C)  Recent Labs  Lab 10/25/23 1745 10/26/23 0418 10/27/23 0317 10/29/23 0730 10/29/23 0740  WBC 7.9 8.1 4.7 4.7  --   CREATININE 5.39* 6.14* 7.78*  --  7.76*  VANCORANDOM  --   --   --  24  --     Estimated Creatinine Clearance: 13.1 mL/min (A) (by C-G formula based on SCr of 7.76 mg/dL (H)).    Allergies  Allergen Reactions   Ivp Dye [Iodinated Contrast Media] Anaphylaxis   Tramadol  Rash    Antimicrobials this admission:   >> Cefepime  8/13 >>>   >> Vancomycin  8/13 >>  Dose adjustments this admission:   Microbiology results:  8/11 BCx: gram-positive cocci in 1 of 4 bottles   MRSA PCR: positive   Thank you for allowing pharmacy to be a part of this patient's care.  Ransom Blanch PGY-1 Pharmacy Resident  [facility]  10/29/2023 10:56 AM

## 2023-10-29 NOTE — Progress Notes (Signed)
 PT Cancellation Note  Patient Details Name: Amareon Phung MRN: [id number] DOB: 1960/04/27   Cancelled Treatment:    Reason Eval/Treat Not Completed: Patient at procedure or test/unavailable Patient is off unit for HD. Will re-attempt at later date/time.  Marlin Jarrard 10/29/2023, 10:29 AM

## 2023-10-30 DIAGNOSIS — J189 Pneumonia, unspecified organism: Secondary | ICD-10-CM | POA: Diagnosis not present

## 2023-10-30 DIAGNOSIS — N186 End stage renal disease: Secondary | ICD-10-CM | POA: Diagnosis not present

## 2023-10-30 LAB — CULTURE, BLOOD (ROUTINE X 2)
Culture: NO GROWTH
Special Requests: ADEQUATE
Special Requests: ADEQUATE

## 2023-10-30 MED ORDER — MORPHINE SULFATE (PF) 2 MG/ML IV SOLN
1.0000 mg | Freq: Once | INTRAVENOUS | Status: AC | PRN
  Administered 2023-10-30: 1 mg via INTRAVENOUS
  Filled 2023-10-30: qty 1

## 2023-10-30 MED ORDER — OXYCODONE-ACETAMINOPHEN 5-325 MG PO TABS
1.0000 | ORAL_TABLET | Freq: Every day | ORAL | Status: DC | PRN
  Administered 2023-10-30 – 2023-11-01 (×3): 1 via ORAL
  Filled 2023-10-30 (×3): qty 1

## 2023-10-30 NOTE — Progress Notes (Signed)
 Mobility Specialist - Progress Note     10/30/23 1620  Mobility  Activity Dangled on edge of bed;Turned to left side;Turned to right side  Level of Assistance Minimal assist, patient does 75% or more  Assistive Device None  Range of Motion/Exercises Active  Activity Response Tolerated well  Mobility Referral No  Mobility visit 1 Mobility  Mobility Specialist Start Time (ACUTE ONLY) 1606  Mobility Specialist Stop Time (ACUTE ONLY) 1620  Mobility Specialist Time Calculation (min) (ACUTE ONLY) 14 min   Pt resting in bed on 3L upon entry. Pt moved to EDGE of bed and Pt moved ModI to End of bed and then back to middle of bed putting weight thorough left foot during movement. Pt left EOB with needs in reach and bed alarm activated.   Guido Rumble Mobility Specialist 10/30/23, 4:48 PM

## 2023-10-30 NOTE — Progress Notes (Signed)
 [facility]  PROGRESS NOTE   Subjective:   Patient seen at bedside.  Comfortable.  On oxygen.  Objective:  Vital signs: Blood pressure 115/69, pulse 61, temperature 98.5 F (36.9 C), temperature source Oral, resp. rate 16, height 6' 3 (1.905 m), weight 105.3 kg, SpO2 96%.  Intake/Output Summary (Last 24 hours) at 10/30/2023 1713 Last data filed at 10/30/2023 1300 Gross per 24 hour  Intake 290 ml  Output --  Net 290 ml   Filed Weights   10/29/23 0500 10/29/23 0739 10/29/23 1127  Weight: 109 kg 108.6 kg 105.3 kg     Physical Exam: General:  No acute distress  Head:  Normocephalic, atraumatic. Moist oral mucosal membranes  Eyes:  Anicteric  Neck:  Supple  Lungs:   Clear to auscultation, normal effort  Heart:  S1S2 no rubs  Abdomen:   Soft, nontender, bowel sounds present  Extremities: 1+Peripheral edema.  Neurologic:  Awake, alert, following commands  Skin:  No lesions  Access:     Basic Metabolic Panel: Recent Labs  Lab 10/25/23 1745 10/26/23 0418 10/27/23 0317 10/29/23 0740  NA 140 139 136 137  K 3.6 3.8 4.0 4.5  CL 95* 99 95* 94*  CO2 30 30 27 31   GLUCOSE 87 96 96 113*  BUN 26* 32* 44* 37*  CREATININE 5.39* 6.14* 7.78* 7.76*  CALCIUM  8.8* 8.2* 8.1* 8.7*  PHOS  --   --   --  3.3   GFR: Estimated Creatinine Clearance: 13 mL/min (A) (by C-G formula based on SCr of 7.76 mg/dL (H)).  Liver Function Tests: Recent Labs  Lab 10/25/23 1745 10/29/23 0740  AST 41  --   ALT 29  --   ALKPHOS 81  --   BILITOT 1.2  --   PROT 8.4*  --   ALBUMIN  3.4* 3.0*   No results for input(s): LIPASE, AMYLASE in the last 168 hours. No results for input(s): AMMONIA in the last 168 hours.  CBC: Recent Labs  Lab 10/25/23 1745 10/26/23 0418 10/27/23 0317 10/29/23 0730  WBC 7.9 8.1 4.7 4.7  NEUTROABS 6.7  --   --   --   HGB 9.1* 7.6* 7.4* 7.6*  HCT 29.6* 25.2* 24.4* 25.2*  MCV 85.8 86.6 87.1 87.5  PLT 170 132* 119* 112*     HbA1C: HB A1C  (BAYER DCA - WAIVED)  Date/Time Value Ref Range Status  01/12/2023 02:13 PM 5.9 (H) 4.8 - 5.6 % Final    Comment:             Prediabetes: 5.7 - 6.4          Diabetes: >6.4          Glycemic control for adults with diabetes: <7.0   10/12/2022 02:20 PM 5.9 (H) 4.8 - 5.6 % Final    Comment:             Prediabetes: 5.7 - 6.4          Diabetes: >6.4          Glycemic control for adults with diabetes: <7.0    Hgb A1c MFr Bld  Date/Time Value Ref Range Status  08/06/2023 05:49 AM 6.0 (H) 4.8 - 5.6 % Final    Comment:    (NOTE) Pre diabetes:          5.7%-6.4%  Diabetes:              >6.4%  Glycemic control for   <7.0% adults with diabetes  02/16/2023 03:54 PM 6.4 (H) 4.8 - 5.6 % Final    Comment:    (NOTE) Pre diabetes:          5.7%-6.4%  Diabetes:              >6.4%  Glycemic control for   <7.0% adults with diabetes     Urinalysis: No results for input(s): COLORURINE, LABSPEC, PHURINE, GLUCOSEU, HGBUR, BILIRUBINUR, KETONESUR, PROTEINUR, UROBILINOGEN, NITRITE, LEUKOCYTESUR in the last 72 hours.  Invalid input(s): APPERANCEUR    Imaging: No results found.   Medications:    cefTRIAXone  (ROCEPHIN )  IV 2 g (10/29/23 2229)    amiodarone   200 mg Oral BID   apixaban   2.5 mg Oral BID   carvedilol   6.25 mg Oral Q breakfast   Chlorhexidine  Gluconate Cloth  6 each Topical Q0600   epoetin  alfa-epbx (RETACRIT ) injection  10,000 Units Intravenous Q M,W,F-1800   iron  polysaccharides  150 mg Oral Daily   leptospermum manuka honey  1 Application Topical Daily   mupirocin  ointment   Nasal BID   rosuvastatin   40 mg Oral Daily   sevelamer  carbonate  2,400 mg Oral TID with meals    Assessment/ Plan:     63 y.o. male with medical history significant of  ESRD-HD (MWF), HTN, HLD, diet-controlled DM, CAD, dCHF, A fib on Eliquis , amenia, chronic pain, s/p of right AKA, who presents with SOB and cough and is being treated for pneumonia.  #1: ESRD:  Patient has been on Monday Wednesday Friday schedule for dialysis.  Had stable dialysis on Friday.  #2: Anemia: Will continue anemia protocol with Epogen  and IV iron .  #3: Secondary hyperparathyroidism: Will continue sevelamer .  #4: Hypertension: Continue carvedilol  2 g salt restricted diet.  #5: Pneumonia: Continue Rocephin  as ordered.  #6: Atrial fibrillation: Continue amiodarone  and Eliquis .  Labs and medications reviewed. Will continue to follow along with you.   LOS: 5 Pinkey Edman, MD [facility] 8/16/20255:13 PM

## 2023-10-30 NOTE — Progress Notes (Signed)
 PROGRESS NOTE Adam Howell    DOB: [birth year], 63 y.o.  FMW:[id number]    Code Status: Full Code   DOA: 10/25/2023   LOS: 5  Brief hospital course  Adam Howell is a 63 y.o. male with medical history significant of  ESRD-HD (MWF), HTN, HLD, diet-controlled DM, CAD, dCHF, A fib on Eliquis , amenia, chronic pain, s/p of right AKA, who presents with SOB and cough x2 days worsening  Requiring O2 and multifical pna on cxray. Admitted for HAP. Started on IV Abx and continued on routine HD  10/30/23 -stable, on 2L today. Continue to wean. Cannot do pulse ox with ambulation testing due to not having prosthetic. Suspect he would benefit from chronic O2 at home when discharged. Antibiotics updated for culture data.  Assessment & Plan  Principal Problem:   HCAP (healthcare-associated pneumonia) Active Problems:   Fluid overload   Chronic diastolic CHF (congestive heart failure) (HCC)   CAD (coronary artery disease)   ESRD (end stage renal disease) on dialysis (HCC)   Atrial fibrillation, chronic (HCC)   Hyperlipidemia associated with type 2 diabetes mellitus (HCC)   Anemia in ESRD (end-stage renal disease) (HCC)   Chronic pain syndrome   Obesity (BMI 30-39.9)  HAP- has previously been intubated and O2 requirement this year. Was not on home oxygen prior to admission.  Chest x-ray showed minimal patchy retrocardiac opacities. Pt does not have fever or leukocytosis. Continue IV CTX- culture data showing moraxella.  S/p Vancomycin  and cefepime  (patient also received 1 dose of azithromycin  in ED) Mucinex  for cough  Bronchodilators Wean O2 as tolerated Continue to work with PT/OT. Patient states he will go home at dc where he lives with his daughter   Chronic diastolic CHF- not in acute exacerbation per exam findings. BNP 1182 in setting of ESRD.  2D echo on 09/11/2023 showed EF of 55 to 60%. Strict I&O, daily weight   CAD  HLD Continue Crestor   Chronic hypotension Midodrine   continued on HD days   ESRD (end stage renal disease) on dialysis (MWF) Continue HD per nephrology.    Atrial fibrillation, chronic  Currently heart rate 50-60s Coreg , amiodarone  Eliquis     Anemia in ESRD (end-stage renal disease) Hemoglobin stable-7.6. - continue epoetin  with HD Follow CBC Hold ASA   Chronic pain syndrome As needed Percocet and Tylenol    Insomnia Continue home ambien , Caution w/ higher dose (10 mg)  Body mass index is 29.02 kg/m.  VTE ppx: apixaban  (ELIQUIS ) tablet 2.5 mg Start: 10/26/23 0245 apixaban  (ELIQUIS ) tablet 2.5 mg   Diet:     Diet   Diet heart healthy/carb modified Fluid consistency: Thin; Fluid restriction: 1200 mL Fluid   Consultants: Nephrology   Subjective 10/30/23    Pt reports feeling well. Denies respiratory distress. No complaints.    Objective  Blood pressure 121/63, pulse (!) 58, temperature 98.1 F (36.7 C), resp. rate (!) 23, height 6' 3 (1.905 m), weight 108 kg, SpO2 100%.  Intake/Output Summary (Last 24 hours) at 10/30/2023 0708 Last data filed at 10/29/2023 1300 Gross per 24 hour  Intake 240 ml  Output 1000 ml  Net -760 ml   Filed Weights   10/29/23 0500 10/29/23 0739 10/29/23 1127  Weight: 109 kg 108.6 kg 105.3 kg    Physical Exam:  General: awake, alert, NAD HEENT: atraumatic, clear conjunctiva, anicteric sclera, MMM, hearing grossly normal Respiratory: normal respiratory effort. CTAB Cardiovascular: quick capillary refill Nervous: A&O x3. no gross focal neurologic deficits, normal speech Extremities: R  L amputation. Negative LE swelling Skin: dry, intact, normal temperature, normal color. No rashes, lesions or ulcers on exposed skin Psychiatry: flat mood, congruent affect  Labs   I have personally reviewed the following labs and imaging studies CBC    Component Value Date/Time   WBC 4.7 10/29/2023 0730   RBC 2.88 (L) 10/29/2023 0730   HGB 7.6 (L) 10/29/2023 0730   HGB 16.8 01/12/2023 1414   HCT  25.2 (L) 10/29/2023 0730   HCT 51.0 01/12/2023 1414   PLT 112 (L) 10/29/2023 0730   PLT 257 01/12/2023 1414   MCV 87.5 10/29/2023 0730   MCV 88 01/12/2023 1414   MCH 26.4 10/29/2023 0730   MCHC 30.2 10/29/2023 0730   RDW 18.7 (H) 10/29/2023 0730   RDW 14.8 01/12/2023 1414   LYMPHSABS 0.5 (L) 10/25/2023 1745   LYMPHSABS 1.0 01/12/2023 1414   MONOABS 0.5 10/25/2023 1745   EOSABS 0.2 10/25/2023 1745   EOSABS 0.2 01/12/2023 1414   BASOSABS 0.1 10/25/2023 1745   BASOSABS 0.1 01/12/2023 1414      Latest Ref Rng & Units 10/29/2023    7:40 AM 10/27/2023    3:17 AM 10/26/2023    4:18 AM  BMP  Glucose 70 - 99 mg/dL 886  96  96   BUN 8 - 23 mg/dL 37  44  32   Creatinine 0.61 - 1.24 mg/dL 2.23  2.21  3.85   Sodium 135 - 145 mmol/L 137  136  139   Potassium 3.5 - 5.1 mmol/L 4.5  4.0  3.8   Chloride 98 - 111 mmol/L 94  95  99   CO2 22 - 32 mmol/L 31  27  30    Calcium  8.9 - 10.3 mg/dL 8.7  8.1  8.2    No results found.  Disposition Plan & Communication  Patient status: Inpatient  Admitted From: Home Planned disposition location: Home Anticipated discharge date: 8/17 pending respiratory improvement   Family Communication: none at bedside    Author: [name], DO [facility] 10/30/2023, 7:08 AM   Available by Epic secure chat 7AM-7PM. If 7PM-7AM, please contact night-coverage.  [facility] contact information found on [email or web address].

## 2023-10-30 NOTE — Plan of Care (Signed)
 Patient had some shortness of breathing today and nebulizer given. Patient had new onset pain today in his hand and right stump, managed with morphine  and percocet as ordered.    Problem: Education: Goal: Knowledge of General Education information will improve Description: Including pain rating scale, medication(s)/side effects and non-pharmacologic comfort measures Outcome: Progressing   Problem: Health Behavior/Discharge Planning: Goal: Ability to manage health-related needs will improve Outcome: Progressing   Problem: Clinical Measurements: Goal: Ability to maintain clinical measurements within normal limits will improve Outcome: Progressing Goal: Will remain free from infection Outcome: Progressing Goal: Diagnostic test results will improve Outcome: Progressing Goal: Respiratory complications will improve Outcome: Progressing Goal: Cardiovascular complication will be avoided Outcome: Progressing   Problem: Activity: Goal: Risk for activity intolerance will decrease Outcome: Progressing   Problem: Nutrition: Goal: Adequate nutrition will be maintained Outcome: Progressing   Problem: Coping: Goal: Level of anxiety will decrease Outcome: Progressing   Problem: Elimination: Goal: Will not experience complications related to bowel motility Outcome: Progressing Goal: Will not experience complications related to urinary retention Outcome: Progressing   Problem: Pain Managment: Goal: General experience of comfort will improve and/or be controlled Outcome: Progressing   Problem: Safety: Goal: Ability to remain free from injury will improve Outcome: Progressing   Problem: Skin Integrity: Goal: Risk for impaired skin integrity will decrease Outcome: Progressing   Problem: Activity: Goal: Ability to tolerate increased activity will improve Outcome: Progressing   Problem: Clinical Measurements: Goal: Ability to maintain a body temperature in the normal range will  improve Outcome: Progressing   Problem: Respiratory: Goal: Ability to maintain adequate ventilation will improve Outcome: Progressing Goal: Ability to maintain a clear airway will improve Outcome: Progressing

## 2023-10-31 DIAGNOSIS — N186 End stage renal disease: Secondary | ICD-10-CM | POA: Diagnosis not present

## 2023-10-31 DIAGNOSIS — J189 Pneumonia, unspecified organism: Secondary | ICD-10-CM | POA: Diagnosis not present

## 2023-10-31 NOTE — Progress Notes (Signed)
 PROGRESS NOTE [name]    DOB: 1960/11/10, 63 y.o.  FMW:[id number]    Code Status: Full Code   DOA: 10/25/2023   LOS: 6  Brief hospital course  Adam Howell is a 63 y.o. male with medical history significant of  ESRD-HD (MWF), HTN, HLD, diet-controlled DM, CAD, dCHF, A fib on Eliquis , amenia, chronic pain, s/p of right AKA, who presents with SOB and cough x2 days worsening  Requiring O2 and multifical pna on cxray. Admitted for HAP. Started on IV Abx and continued on routine HD  10/31/23 -stable, on 2L today. Continue to wean. Cannot do pulse ox with ambulation testing due to not having prosthetic. Suspect he would benefit from chronic O2 at home when discharged. Antibiotics updated for culture data.  Assessment & Plan  Principal Problem:   HCAP (healthcare-associated pneumonia) Active Problems:   Fluid overload   Chronic diastolic CHF (congestive heart failure) (HCC)   CAD (coronary artery disease)   ESRD (end stage renal disease) on dialysis (HCC)   Atrial fibrillation, chronic (HCC)   Hyperlipidemia associated with type 2 diabetes mellitus (HCC)   Anemia in ESRD (end-stage renal disease) (HCC)   Chronic pain syndrome   Obesity (BMI 30-39.9)  HAP- has previously been intubated and O2 requirement this year. Was not on home oxygen prior to admission.  Chest x-ray showed minimal patchy retrocardiac opacities. Pt does not have fever or leukocytosis. Continue IV CTX- culture data showing moraxella.  S/p Vancomycin  and cefepime  (patient also received 1 dose of azithromycin  in ED) Mucinex  for cough  Bronchodilators Wean O2 as tolerated- suspect he will need O2 to go home.  Continue to work with PT/OT. Patient states he will go home at dc where he lives with his daughter   Chronic diastolic CHF- not in acute exacerbation per exam findings. BNP 1182 in setting of ESRD.  2D echo on 09/11/2023 showed EF of 55 to 60%. Strict I&O, daily weight   CAD  HLD Continue  Crestor   Chronic hypotension Midodrine  continued on HD days   ESRD (end stage renal disease) on dialysis (MWF) Continue HD per nephrology.    Atrial fibrillation, chronic  Currently heart rate 60s Coreg , amiodarone  Eliquis     Anemia in ESRD (end-stage renal disease) Hemoglobin stable-7.6. - continue epoetin  with HD Follow CBC Hold ASA   Chronic pain syndrome As needed Percocet and Tylenol    Insomnia Continue home ambien , Caution w/ higher dose (10 mg)  Body mass index is 29.84 kg/m.  VTE ppx: apixaban  (ELIQUIS ) tablet 2.5 mg Start: 10/26/23 0245 apixaban  (ELIQUIS ) tablet 2.5 mg   Diet:     Diet   Diet heart healthy/carb modified Fluid consistency: Thin; Fluid restriction: 1200 mL Fluid   Consultants: Nephrology   Subjective 10/31/23    Pt reports feeling well. Denies respiratory distress. No complaints.    Objective  Blood pressure 121/63, pulse (!) 58, temperature 98.1 F (36.7 C), resp. rate (!) 23, height 6' 3 (1.905 m), weight 108 kg, SpO2 100%.  Intake/Output Summary (Last 24 hours) at 10/31/2023 0708 Last data filed at 10/31/2023 9360 Gross per 24 hour  Intake 505.9 ml  Output --  Net 505.9 ml   Filed Weights   10/29/23 0739 10/29/23 1127 10/31/23 0500  Weight: 108.6 kg 105.3 kg 108.3 kg    Physical Exam:  General: awake, alert, NAD HEENT: atraumatic, clear conjunctiva, anicteric sclera, MMM, hearing grossly normal Respiratory: normal respiratory effort. CTAB Cardiovascular: quick capillary refill Nervous: A&O x3. no  gross focal neurologic deficits, normal speech Extremities: R L amputation. Negative LE swelling Skin: dry, intact, normal temperature, normal color. No rashes, lesions or ulcers on exposed skin Psychiatry: flat mood, congruent affect  Labs   I have personally reviewed the following labs and imaging studies CBC    Component Value Date/Time   WBC 4.7 10/29/2023 0730   RBC 2.88 (L) 10/29/2023 0730   HGB 7.6 (L) 10/29/2023 0730    HGB 16.8 01/12/2023 1414   HCT 25.2 (L) 10/29/2023 0730   HCT 51.0 01/12/2023 1414   PLT 112 (L) 10/29/2023 0730   PLT 257 01/12/2023 1414   MCV 87.5 10/29/2023 0730   MCV 88 01/12/2023 1414   MCH 26.4 10/29/2023 0730   MCHC 30.2 10/29/2023 0730   RDW 18.7 (H) 10/29/2023 0730   RDW 14.8 01/12/2023 1414   LYMPHSABS 0.5 (L) 10/25/2023 1745   LYMPHSABS 1.0 01/12/2023 1414   MONOABS 0.5 10/25/2023 1745   EOSABS 0.2 10/25/2023 1745   EOSABS 0.2 01/12/2023 1414   BASOSABS 0.1 10/25/2023 1745   BASOSABS 0.1 01/12/2023 1414      Latest Ref Rng & Units 10/29/2023    7:40 AM 10/27/2023    3:17 AM 10/26/2023    4:18 AM  BMP  Glucose 70 - 99 mg/dL 886  96  96   BUN 8 - 23 mg/dL 37  44  32   Creatinine 0.61 - 1.24 mg/dL 2.23  2.21  3.85   Sodium 135 - 145 mmol/L 137  136  139   Potassium 3.5 - 5.1 mmol/L 4.5  4.0  3.8   Chloride 98 - 111 mmol/L 94  95  99   CO2 22 - 32 mmol/L 31  27  30    Calcium  8.9 - 10.3 mg/dL 8.7  8.1  8.2    No results found.  Disposition Plan & Communication  Patient status: Inpatient  Admitted From: Home Planned disposition location: Home Anticipated discharge date: 8/18 pending respiratory improvement   Family Communication: none at bedside    Author: [name], DO [facility] 10/31/2023, 7:08 AM   Available by Epic secure chat 7AM-7PM. If 7PM-7AM, please contact night-coverage.  [facility] contact information found on [email or web address].

## 2023-10-31 NOTE — Plan of Care (Signed)
  Problem: Clinical Measurements: Goal: Will remain free from infection Outcome: Progressing Goal: Respiratory complications will improve Outcome: Progressing   Problem: Activity: Goal: Risk for activity intolerance will decrease Outcome: Progressing   

## 2023-10-31 NOTE — Progress Notes (Signed)
 [facility]  PROGRESS NOTE   Subjective:   Comfortable. Vitals are stable  Objective:  Vital signs: Blood pressure (!) 129/56, pulse 61, temperature 98.9 F (37.2 C), temperature source Oral, resp. rate 18, height 6' 3 (1.905 m), weight 108.3 kg, SpO2 100%.  Intake/Output Summary (Last 24 hours) at 10/31/2023 1221 Last data filed at 10/31/2023 0900 Gross per 24 hour  Intake 215.9 ml  Output --  Net 215.9 ml   Filed Weights   10/29/23 0739 10/29/23 1127 10/31/23 0500  Weight: 108.6 kg 105.3 kg 108.3 kg     Physical Exam: General:  No acute distress  Head:  Normocephalic, atraumatic. Moist oral mucosal membranes  Eyes:  Anicteric  Neck:  Supple  Lungs:   Clear to auscultation, normal effort  Heart:  S1S2 no rubs  Abdomen:   Soft, nontender, bowel sounds present  Extremities:  peripheral edema.  Neurologic:  Awake, alert, following commands  Skin:  No lesions  Access:     Basic Metabolic Panel: Recent Labs  Lab 10/25/23 1745 10/26/23 0418 10/27/23 0317 10/29/23 0740  NA 140 139 136 137  K 3.6 3.8 4.0 4.5  CL 95* 99 95* 94*  CO2 30 30 27 31   GLUCOSE 87 96 96 113*  BUN 26* 32* 44* 37*  CREATININE 5.39* 6.14* 7.78* 7.76*  CALCIUM  8.8* 8.2* 8.1* 8.7*  PHOS  --   --   --  3.3   GFR: Estimated Creatinine Clearance: 13.1 mL/min (A) (by C-G formula based on SCr of 7.76 mg/dL (H)).  Liver Function Tests: Recent Labs  Lab 10/25/23 1745 10/29/23 0740  AST 41  --   ALT 29  --   ALKPHOS 81  --   BILITOT 1.2  --   PROT 8.4*  --   ALBUMIN  3.4* 3.0*   No results for input(s): LIPASE, AMYLASE in the last 168 hours. No results for input(s): AMMONIA in the last 168 hours.  CBC: Recent Labs  Lab 10/25/23 1745 10/26/23 0418 10/27/23 0317 10/29/23 0730  WBC 7.9 8.1 4.7 4.7  NEUTROABS 6.7  --   --   --   HGB 9.1* 7.6* 7.4* 7.6*  HCT 29.6* 25.2* 24.4* 25.2*  MCV 85.8 86.6 87.1 87.5  PLT 170 132* 119* 112*     HbA1C: HB A1C (BAYER DCA  - WAIVED)  Date/Time Value Ref Range Status  01/12/2023 02:13 PM 5.9 (H) 4.8 - 5.6 % Final    Comment:             Prediabetes: 5.7 - 6.4          Diabetes: >6.4          Glycemic control for adults with diabetes: <7.0   10/12/2022 02:20 PM 5.9 (H) 4.8 - 5.6 % Final    Comment:             Prediabetes: 5.7 - 6.4          Diabetes: >6.4          Glycemic control for adults with diabetes: <7.0    Hgb A1c MFr Bld  Date/Time Value Ref Range Status  08/06/2023 05:49 AM 6.0 (H) 4.8 - 5.6 % Final    Comment:    (NOTE) Pre diabetes:          5.7%-6.4%  Diabetes:              >6.4%  Glycemic control for   <7.0% adults with diabetes   02/16/2023 03:54  PM 6.4 (H) 4.8 - 5.6 % Final    Comment:    (NOTE) Pre diabetes:          5.7%-6.4%  Diabetes:              >6.4%  Glycemic control for   <7.0% adults with diabetes     Urinalysis: No results for input(s): COLORURINE, LABSPEC, PHURINE, GLUCOSEU, HGBUR, BILIRUBINUR, KETONESUR, PROTEINUR, UROBILINOGEN, NITRITE, LEUKOCYTESUR in the last 72 hours.  Invalid input(s): APPERANCEUR    Imaging: No results found.   Medications:    cefTRIAXone  (ROCEPHIN )  IV Stopped (10/30/23 2239)    amiodarone   200 mg Oral BID   apixaban   2.5 mg Oral BID   carvedilol   6.25 mg Oral Q breakfast   Chlorhexidine  Gluconate Cloth  6 each Topical Q0600   epoetin  alfa-epbx (RETACRIT ) injection  10,000 Units Intravenous Q M,W,F-1800   iron  polysaccharides  150 mg Oral Daily   leptospermum manuka honey  1 Application Topical Daily   mupirocin  ointment   Nasal BID   rosuvastatin   40 mg Oral Daily   sevelamer  carbonate  2,400 mg Oral TID with meals    Assessment/ Plan:     63 y.o. male with medical history significant of  ESRD-HD (MWF), HTN, HLD, diet-controlled DM, CAD, dCHF, A fib on Eliquis , amenia, chronic pain, s/p of right AKA, who presents with SOB and cough and is being treated for pneumonia.   #1: ESRD: Patient has  been on Monday Wednesday Friday schedule for dialysis. Had stable dialysis on Friday.  Orders placed for dialysis tomorrow.   #2: Anemia: Will continue anemia protocol with Epogen  and IV iron .   #3: Secondary hyperparathyroidism: Will continue sevelamer .   #4: Hypertension: Continue carvedilol  2 g salt restricted diet.   #5: Pneumonia: Continue Rocephin  as ordered.   #6: Atrial fibrillation: Continue amiodarone  and Eliquis .    Labs and medications reviewed. Will continue to follow along with you.   LOS: 6 Adam Victory, MD [facility] 8/17/202512:21 PM

## 2023-10-31 NOTE — Plan of Care (Signed)
  Problem: Education: Goal: Knowledge of General Education information will improve Description: Including pain rating scale, medication(s)/side effects and non-pharmacologic comfort measures Outcome: Progressing   Problem: Health Behavior/Discharge Planning: Goal: Ability to manage health-related needs will improve Outcome: Progressing   Problem: Clinical Measurements: Goal: Ability to maintain clinical measurements within normal limits will improve Outcome: Progressing Goal: Will remain free from infection Outcome: Progressing Goal: Diagnostic test results will improve Outcome: Progressing Goal: Respiratory complications will improve Outcome: Progressing Goal: Cardiovascular complication will be avoided Outcome: Progressing   Problem: Nutrition: Goal: Adequate nutrition will be maintained Outcome: Progressing   Problem: Coping: Goal: Level of anxiety will decrease Outcome: Progressing   Problem: Pain Managment: Goal: General experience of comfort will improve and/or be controlled Outcome: Progressing   Problem: Activity: Goal: Ability to tolerate increased activity will improve Outcome: Progressing   Problem: Clinical Measurements: Goal: Ability to maintain a body temperature in the normal range will improve Outcome: Progressing

## 2023-11-01 ENCOUNTER — Other Ambulatory Visit: Payer: Self-pay

## 2023-11-01 DIAGNOSIS — J189 Pneumonia, unspecified organism: Secondary | ICD-10-CM

## 2023-11-01 DIAGNOSIS — N186 End stage renal disease: Secondary | ICD-10-CM

## 2023-11-01 LAB — RENAL FUNCTION PANEL
Albumin: 2.8 g/dL — ABNORMAL LOW (ref 3.5–5.0)
Anion gap: 13 (ref 5–15)
BUN: 50 mg/dL — ABNORMAL HIGH (ref 8–23)
CO2: 27 mmol/L (ref 22–32)
Calcium: 8.5 mg/dL — ABNORMAL LOW (ref 8.9–10.3)
Chloride: 95 mmol/L — ABNORMAL LOW (ref 98–111)
Creatinine, Ser: 9.32 mg/dL — ABNORMAL HIGH (ref 0.61–1.24)
GFR, Estimated: 6 mL/min — ABNORMAL LOW (ref >60)
Glucose, Bld: 109 mg/dL — ABNORMAL HIGH (ref 70–99)
Phosphorus: 3.7 mg/dL (ref 2.5–4.6)
Potassium: 4.4 mmol/L (ref 3.5–5.1)
Sodium: 135 mmol/L (ref 135–145)

## 2023-11-01 LAB — CBC
HCT: 24 % — ABNORMAL LOW (ref 39.0–52.0)
Hemoglobin: 7.4 g/dL — ABNORMAL LOW (ref 13.0–17.0)
MCH: 26.1 pg (ref 26.0–34.0)
MCHC: 30.8 g/dL (ref 30.0–36.0)
MCV: 84.8 fL (ref 80.0–100.0)
Platelets: 120 K/uL — ABNORMAL LOW (ref 150–400)
RBC: 2.83 MIL/uL — ABNORMAL LOW (ref 4.22–5.81)
RDW: 18.6 % — ABNORMAL HIGH (ref 11.5–15.5)
WBC: 4 K/uL (ref 4.0–10.5)
nRBC: 0 % (ref 0.0–0.2)

## 2023-11-01 MED ORDER — OXYCODONE-ACETAMINOPHEN 5-325 MG PO TABS
ORAL_TABLET | ORAL | Status: AC
  Filled 2023-11-01: qty 1

## 2023-11-01 MED ORDER — AMOXICILLIN-POT CLAVULANATE 500-125 MG PO TABS
1.0000 | ORAL_TABLET | Freq: Every day | ORAL | 0 refills | Status: AC
  Filled 2023-11-01: qty 4, 4d supply, fill #0

## 2023-11-01 MED ORDER — AMOXICILLIN-POT CLAVULANATE 250-125 MG PO TABS
1.0000 | ORAL_TABLET | Freq: Every day | ORAL | Status: DC
  Administered 2023-11-01: 1 via ORAL
  Filled 2023-11-01 (×2): qty 1

## 2023-11-01 MED ORDER — HEPARIN SODIUM (PORCINE) 1000 UNIT/ML IJ SOLN
INTRAMUSCULAR | Status: AC
Start: 2023-11-01 — End: 2023-11-01
  Filled 2023-11-01: qty 5

## 2023-11-01 NOTE — Progress Notes (Incomplete)
 PROGRESS NOTE Adam Howell    DOB: June 16, 1960, 63 y.o.  FMW:[id number]    Code Status: Full Code   DOA: 10/25/2023   LOS: 7  Brief hospital course  Adam Howell is a 63 y.o. male with medical history significant of  ESRD-HD (MWF), HTN, HLD, diet-controlled DM, CAD, dCHF, A fib on Eliquis , amenia, chronic pain, s/p of right AKA, who presents with SOB and cough x2 days worsening  Requiring O2 and multifical pna on cxray. Admitted for HAP. Started on IV Abx and continued on routine HD  11/01/23 -stable, on 2L today. Continue to wean. Cannot do pulse ox with ambulation testing due to not having prosthetic. Suspect he would benefit from chronic O2 at home when discharged. Antibiotics updated for culture data.  Assessment & Plan  Principal Problem:   HCAP (healthcare-associated pneumonia) Active Problems:   Fluid overload   Chronic diastolic CHF (congestive heart failure) (HCC)   CAD (coronary artery disease)   ESRD (end stage renal disease) on dialysis (HCC)   Atrial fibrillation, chronic (HCC)   Hyperlipidemia associated with type 2 diabetes mellitus (HCC)   Anemia in ESRD (end-stage renal disease) (HCC)   Chronic pain syndrome   Obesity (BMI 30-39.9)  HAP- has previously been intubated and O2 requirement this year. Was not on home oxygen prior to admission.  Chest x-ray showed minimal patchy retrocardiac opacities. Pt does not have fever or leukocytosis. Continue IV CTX- culture data showing moraxella.  S/p Vancomycin  and cefepime  (patient also received 1 dose of azithromycin  in ED) Mucinex  for cough  Bronchodilators Wean O2 as tolerated- suspect he will need O2 to go home.  Continue to work with PT/OT. Patient states [place]   Chronic diastolic CHF- not in acute exacerbation per exam findings. BNP 1182 in setting of ESRD.  2D echo on 09/11/2023 showed EF of 55 to 60%. Strict I&O, daily weight   CAD  HLD Continue  Crestor   Chronic hypotension Midodrine  continued on HD days   ESRD (end stage renal disease) on dialysis (MWF) Continue HD per nephrology.    Atrial fibrillation, chronic  Currently heart rate 60s Coreg , amiodarone  Eliquis     Anemia in ESRD (end-stage renal disease) Hemoglobin stable-7.6. - continue epoetin  with HD Follow CBC Hold ASA   Chronic pain syndrome As needed Percocet and Tylenol    Insomnia Continue home ambien , Caution w/ higher dose (10 mg)  Body mass index is 30.34 kg/m.  VTE ppx: apixaban  (ELIQUIS ) tablet 2.5 mg Start: 10/26/23 0245 apixaban  (ELIQUIS ) tablet 2.5 mg   Diet:     Diet   Diet heart healthy/carb modified Fluid consistency: Thin; Fluid restriction: 1200 mL Fluid   Consultants: Nephrology   Subjective 11/01/23    Pt reports feeling well. Denies respiratory distress. No complaints.    Objective  Blood pressure 121/63, pulse (!) 58, temperature 98.1 F (36.7 C), resp. rate (!) 23, height 6' 3 (1.905 m), weight 108 kg, SpO2 100%.  Intake/Output Summary (Last 24 hours) at 11/01/2023 0715 Last data filed at 10/31/2023 1900 Gross per 24 hour  Intake 520 ml  Output --  Net 520 ml   Filed Weights   10/29/23 1127 10/31/23 0500 10/31/23 2030  Weight: 105.3 kg 108.3 kg 110.1 kg    Physical Exam:  General: awake, alert, NAD HEENT: atraumatic, clear conjunctiva, anicteric sclera, MMM, hearing grossly normal Respiratory: normal respiratory effort. CTAB Cardiovascular: quick capillary refill Nervous: A&O x3. no  gross focal neurologic deficits, normal speech Extremities: R L amputation. Negative LE swelling Skin: dry, intact, normal temperature, normal color. No rashes, lesions or ulcers on exposed skin Psychiatry: flat mood, congruent affect  Labs   I have personally reviewed the following labs and imaging studies CBC    Component Value Date/Time   WBC 4.0 11/01/2023 0450   RBC 2.83 (L) 11/01/2023 0450   HGB 7.4 (L) 11/01/2023 0450    HGB 16.8 01/12/2023 1414   HCT 24.0 (L) 11/01/2023 0450   HCT 51.0 01/12/2023 1414   PLT 120 (L) 11/01/2023 0450   PLT 257 01/12/2023 1414   MCV 84.8 11/01/2023 0450   MCV 88 01/12/2023 1414   MCH 26.1 11/01/2023 0450   MCHC 30.8 11/01/2023 0450   RDW 18.6 (H) 11/01/2023 0450   RDW 14.8 01/12/2023 1414   LYMPHSABS 0.5 (L) 10/25/2023 1745   LYMPHSABS 1.0 01/12/2023 1414   MONOABS 0.5 10/25/2023 1745   EOSABS 0.2 10/25/2023 1745   EOSABS 0.2 01/12/2023 1414   BASOSABS 0.1 10/25/2023 1745   BASOSABS 0.1 01/12/2023 1414      Latest Ref Rng & Units 11/01/2023    4:50 AM 10/29/2023    7:40 AM 10/27/2023    3:17 AM  BMP  Glucose 70 - 99 mg/dL 890  886  96   BUN 8 - 23 mg/dL 50  37  44   Creatinine 0.61 - 1.24 mg/dL 0.67  2.23  2.21   Sodium 135 - 145 mmol/L 135  137  136   Potassium 3.5 - 5.1 mmol/L 4.4  4.5  4.0   Chloride 98 - 111 mmol/L 95  94  95   CO2 22 - 32 mmol/L 27  31  27    Calcium  8.9 - 10.3 mg/dL 8.5  8.7  8.1    No results found.  Disposition Plan & Communication  Patient status: Inpatient  Admitted From: Home Planned disposition location: Home Anticipated discharge date: 8/18 pending respiratory improvement   Family Communication: none at bedside    Author: [name], DO [facility] 11/01/2023, 7:15 AM   Available by Epic secure chat 7AM-7PM. If 7PM-7AM, please contact night-coverage.  [facility] contact information found on [email or web address].

## 2023-11-01 NOTE — Progress Notes (Addendum)
 [facility]  ROUNDING NOTE   Subjective:   Adam Howell is a 63 y.o male with past medical history of diabetes, hypertension, CAD, Rt AKA, anemia, CHF, and ESRD on dialysis. Patient presents to ED with shortness of breath and cough and has been admitted for Hypoxia [R09.02] HCAP (healthcare-associated pneumonia) [J18.9] Community acquired pneumonia, unspecified laterality [J18.9]  Patient is known to our practice and receives dialysis at [facility] on MWF schedule, supervised by [facility] physicians.  Update:  Patient seen and evaluated during dialysis   HEMODIALYSIS FLOWSHEET:  Blood Flow Rate (mL/min): 350 mL/min Arterial Pressure (mmHg): -120.6 mmHg Venous Pressure (mmHg): 167.46 mmHg TMP (mmHg): 14.75 mmHg Ultrafiltration Rate (mL/min): 543 mL/min Dialysate Flow Rate (mL/min): 299 ml/min Dialysis Fluid Bolus: Normal Saline  Complaining of generalized discomfort   Objective:  Vital signs in last 24 hours:  Temp:  [97.9 F (36.6 C)-98.9 F (37.2 C)] 97.9 F (36.6 C) (08/18 0740) Pulse Rate:  [57-69] 60 (08/18 1030) Resp:  [16-25] 20 (08/18 1030) BP: (129-161)/(56-87) 147/85 (08/18 1030) SpO2:  [89 %-100 %] 97 % (08/18 1000) Weight:  [110.1 kg] 110.1 kg (08/17 2030)  Weight change: 1.8 kg Filed Weights   10/29/23 1127 10/31/23 0500 10/31/23 2030  Weight: 105.3 kg 108.3 kg 110.1 kg    Intake/Output: I/O last 3 completed shifts: In: 735.9 [P.O.:520; IV Piggyback:215.9] Out: -    Intake/Output this shift:  No intake/output data recorded.  Physical Exam: General: NAD  Head: Normocephalic, atraumatic. Moist oral mucosal membranes  Eyes: Anicteric  Neck: Supple  Lungs:  Diminished, [place] O2  Heart: Regular rate and rhythm  Abdomen:  Soft, nontender  Extremities:  1+ peripheral edema, AKA  Neurologic: Awake, alert, conversant  Skin: Warm,dry, no rash  Access: Lt AVF    Basic Metabolic Panel: Recent Labs  Lab 10/25/23 1745 10/26/23 0418  10/27/23 0317 10/29/23 0740 11/01/23 0450  NA 140 139 136 137 135  K 3.6 3.8 4.0 4.5 4.4  CL 95* 99 95* 94* 95*  CO2 30 30 27 31 27   GLUCOSE 87 96 96 113* 109*  BUN 26* 32* 44* 37* 50*  CREATININE 5.39* 6.14* 7.78* 7.76* 9.32*  CALCIUM  8.8* 8.2* 8.1* 8.7* 8.5*  PHOS  --   --   --  3.3 3.7    Liver Function Tests: Recent Labs  Lab 10/25/23 1745 10/29/23 0740 11/01/23 0450  AST 41  --   --   ALT 29  --   --   ALKPHOS 81  --   --   BILITOT 1.2  --   --   PROT 8.4*  --   --   ALBUMIN  3.4* 3.0* 2.8*   No results for input(s): LIPASE, AMYLASE in the last 168 hours. No results for input(s): AMMONIA in the last 168 hours.  CBC: Recent Labs  Lab 10/25/23 1745 10/26/23 0418 10/27/23 0317 10/29/23 0730 11/01/23 0450  WBC 7.9 8.1 4.7 4.7 4.0  NEUTROABS 6.7  --   --   --   --   HGB 9.1* 7.6* 7.4* 7.6* 7.4*  HCT 29.6* 25.2* 24.4* 25.2* 24.0*  MCV 85.8 86.6 87.1 87.5 84.8  PLT 170 132* 119* 112* 120*    Cardiac Enzymes: No results for input(s): CKTOTAL, CKMB, CKMBINDEX, TROPONINI in the last 168 hours.  BNP: Invalid input(s): POCBNP  CBG: Recent Labs  Lab 10/26/23 1744 10/27/23 1156  GLUCAP 89 122*    Microbiology: Results for orders placed or performed during the hospital  encounter of 10/25/23  Blood Culture (routine x 2)     Status: None   Collection Time: 10/25/23 10:29 PM   Specimen: BLOOD  Result Value Ref Range Status   Specimen Description BLOOD BLOOD RIGHT ARM  Final   Special Requests   Final    BOTTLES DRAWN AEROBIC AND ANAEROBIC Blood Culture adequate volume   Culture   Final    NO GROWTH 5 DAYS Performed at [facility], [address] [place], [place] [address]    Report Status 10/30/2023 FINAL  Final  Blood Culture (routine x 2)     Status: Abnormal   Collection Time: 10/25/23 10:29 PM   Specimen: BLOOD  Result Value Ref Range Status   Specimen Description   Final    BLOOD BLOOD RIGHT HAND Performed at [facility], [address], [place], [place] [address]    Special Requests   Final    Blood Culture adequate volume Performed at [facility], [address] [place], [place] [address]    Culture  Setup Time (A)  Final    GRAM NEGATIVE DIPLOCOCCI AEROBIC BOTTLE ONLY CRITICAL RESULT CALLED TO, READ BACK BY AND VERIFIED WITH: JSAON ROBBINS 2150 10/27/23 [facility] CORRECTED RESULTS PREVIOUSLY REPORTED AS: GRAM POSITIVE COCCI CRITICAL RESULT CALLED TO, READ BACK BY AND VERIFIED WITH: PHARMD TREY GREENWOOD [phone] AT 1124 BY EC    Culture (A)  Final    MORAXELLA CATARRHALIS(BRANHAMELLA) BETA LACTAMASE POSITIVE Performed at [facility], [address], [place], [place] [address]    Report Status 10/30/2023 FINAL  Final  Blood Culture ID Panel (Reflexed)     Status: None   Collection Time: 10/25/23 10:29 PM  Result Value Ref Range Status   Enterococcus faecalis NOT DETECTED NOT DETECTED Final   Enterococcus Faecium NOT DETECTED NOT DETECTED Final   Listeria monocytogenes NOT DETECTED NOT DETECTED Final   Staphylococcus species NOT DETECTED NOT DETECTED Final   Staphylococcus aureus (BCID) NOT DETECTED NOT DETECTED Final   Staphylococcus epidermidis NOT DETECTED NOT DETECTED Final   Staphylococcus lugdunensis NOT DETECTED NOT DETECTED Final   Streptococcus species NOT DETECTED NOT DETECTED Final   Streptococcus agalactiae NOT DETECTED NOT DETECTED Final   Streptococcus pneumoniae NOT DETECTED NOT DETECTED Final   Streptococcus pyogenes NOT DETECTED NOT DETECTED Final   A.calcoaceticus-baumannii NOT DETECTED NOT DETECTED Final   Bacteroides fragilis NOT DETECTED NOT DETECTED Final   Enterobacterales NOT DETECTED NOT DETECTED Final   Enterobacter cloacae complex NOT DETECTED NOT DETECTED Final   Escherichia coli NOT DETECTED NOT DETECTED Final   Klebsiella aerogenes NOT DETECTED NOT DETECTED Final   Klebsiella oxytoca NOT DETECTED NOT DETECTED Final   Klebsiella  pneumoniae NOT DETECTED NOT DETECTED Final   Proteus species NOT DETECTED NOT DETECTED Final   Salmonella species NOT DETECTED NOT DETECTED Final   Serratia marcescens NOT DETECTED NOT DETECTED Final   Haemophilus influenzae NOT DETECTED NOT DETECTED Final   Neisseria meningitidis NOT DETECTED NOT DETECTED Final   Pseudomonas aeruginosa NOT DETECTED NOT DETECTED Final   Stenotrophomonas maltophilia NOT DETECTED NOT DETECTED Final   Candida albicans NOT DETECTED NOT DETECTED Final   Candida auris NOT DETECTED NOT DETECTED Final   Candida glabrata NOT DETECTED NOT DETECTED Final   Candida krusei NOT DETECTED NOT DETECTED Final   Candida parapsilosis NOT DETECTED NOT DETECTED Final   Candida tropicalis NOT DETECTED NOT DETECTED Final   Cryptococcus neoformans/gattii NOT DETECTED NOT DETECTED Final  Comment: Performed at [facility], [address] [place], [place] [address]  Resp panel by RT-PCR (RSV, Flu A&B, Covid) Anterior Nasal Swab     Status: None   Collection Time: 10/25/23 11:36 PM   Specimen: Anterior Nasal Swab  Result Value Ref Range Status   SARS Coronavirus 2 by RT PCR NEGATIVE NEGATIVE Final    Comment: (NOTE) SARS-CoV-2 target nucleic acids are NOT DETECTED.  The SARS-CoV-2 RNA is generally detectable in upper respiratory specimens during the acute phase of infection. The lowest concentration of SARS-CoV-2 viral copies this assay can detect is 138 copies/mL. A negative result does not preclude SARS-Cov-2 infection and should not be used as the sole basis for treatment or other patient management decisions. A negative result may occur with  improper specimen collection/handling, submission of specimen other than nasopharyngeal swab, presence of viral mutation(s) within the areas targeted by this assay, and inadequate number of viral copies(<138 copies/mL). A negative result must be combined with clinical observations, patient history, and  epidemiological information. The expected result is Negative.  Fact Sheet for Patients:  BloggerCourse.com  Fact Sheet for Healthcare Providers:  SeriousBroker.it  This test is no t yet approved or cleared by the [place]  FDA and  has been authorized for detection and/or diagnosis of SARS-CoV-2 by FDA under an Emergency Use Authorization (EUA). This EUA will remain  in effect (meaning this test can be used) for the duration of the COVID-19 declaration under Section 564(b)(1) of the Act, 21 U.S.C.section 360bbb-3(b)(1), unless the authorization is terminated  or revoked sooner.       Influenza A by PCR NEGATIVE NEGATIVE Final   Influenza B by PCR NEGATIVE NEGATIVE Final    Comment: (NOTE) The Xpert Xpress SARS-CoV-2/FLU/RSV plus assay is intended as an aid in the diagnosis of influenza from Nasopharyngeal swab specimens and should not be used as a sole basis for treatment. Nasal washings and aspirates are unacceptable for Xpert Xpress SARS-CoV-2/FLU/RSV testing.  Fact Sheet for Patients: BloggerCourse.com  Fact Sheet for Healthcare Providers: SeriousBroker.it  This test is not yet approved or cleared by the [place]  FDA and has been authorized for detection and/or diagnosis of SARS-CoV-2 by FDA under an Emergency Use Authorization (EUA). This EUA will remain in effect (meaning this test can be used) for the duration of the COVID-19 declaration under Section 564(b)(1) of the Act, 21 U.S.C. section 360bbb-3(b)(1), unless the authorization is terminated or revoked.     Resp Syncytial Virus by PCR NEGATIVE NEGATIVE Final    Comment: (NOTE) Fact Sheet for Patients: BloggerCourse.com  Fact Sheet for Healthcare Providers: SeriousBroker.it  This test is not yet approved or cleared by the [place]  FDA and has been  authorized for detection and/or diagnosis of SARS-CoV-2 by FDA under an Emergency Use Authorization (EUA). This EUA will remain in effect (meaning this test can be used) for the duration of the COVID-19 declaration under Section 564(b)(1) of the Act, 21 U.S.C. section 360bbb-3(b)(1), unless the authorization is terminated or revoked.  Performed at [facility], [address] [place], [place] [address]   MRSA Next Gen by PCR, Nasal     Status: Abnormal   Collection Time: 10/26/23  5:15 AM   Specimen: Nasal Mucosa; Nasal Swab  Result Value Ref Range Status   MRSA by PCR Next Gen DETECTED (A) NOT DETECTED Final    Comment: RESULT CALLED TO, READ BACK BY AND VERIFIED WITH: L'BELLLE Pilot Prindle 10/26/23 0855 MW (NOTE) The GeneXpert MRSA  Assay (FDA approved for NASAL specimens only), is one component of a comprehensive MRSA colonization surveillance program. It is not intended to diagnose MRSA infection nor to guide or monitor treatment for MRSA infections. Test performance is not FDA approved in patients less than 72 years old. Performed at [facility], [address] [place], [place] [address]     Coagulation Studies: No results for input(s): LABPROT, INR in the last 72 hours.  Urinalysis: No results for input(s): COLORURINE, LABSPEC, PHURINE, GLUCOSEU, HGBUR, BILIRUBINUR, KETONESUR, PROTEINUR, UROBILINOGEN, NITRITE, LEUKOCYTESUR in the last 72 hours.  Invalid input(s): APPERANCEUR    Imaging: No results found.    Medications:    cefTRIAXone  (ROCEPHIN )  IV 2 g (10/31/23 2100)    amiodarone   200 mg Oral BID   apixaban   2.5 mg Oral BID   carvedilol   6.25 mg Oral Q breakfast   Chlorhexidine  Gluconate Cloth  6 each Topical Q0600   epoetin  alfa-epbx (RETACRIT ) injection  10,000 Units Intravenous Q M,W,F-1800   iron  polysaccharides  150 mg Oral Daily   leptospermum manuka honey  1 Application Topical Daily   mupirocin  ointment    Nasal BID   rosuvastatin   40 mg Oral Daily   sevelamer  carbonate  2,400 mg Oral TID with meals   acetaminophen , albuterol , alum & mag hydroxide-simeth, dextromethorphan -guaiFENesin , ondansetron  (ZOFRAN ) IV, oxyCODONE -acetaminophen , zolpidem   Assessment/ Plan:  [name] is a 63 y.o.  male with past medical history of diabetes, hypertension, CAD, Rt AKA, anemia, CHF, and ESRD on dialysis.   [facility]/MWF/left AVF   End-stage renal disease on dialysis.  Receiving dialysis today, UF goal 1L as tolerated. Next treatment scheduled for Wednesday.  2. Anemia of chronic kidney disease Lab Results  Component Value Date   HGB 7.4 (L) 11/01/2023    Hemoglobin 7.4.  Continue Epogen  10,000 units with dialysis.  3. Secondary Hyperparathyroidism: with outpatient labs: PTH 982, phosphorus 5.9, calcium  8.4 on 10/18/23.   Lab Results  Component Value Date   CALCIUM  8.5 (L) 11/01/2023   CAION 1.06 (L) 08/05/2023   PHOS 3.7 11/01/2023    Calcium  and phosphorus within desired range. Continue Sevelamer  with meals.    4.  Hypertension with chronic kidney disease.  Continue carvedilol  for hypertension control. Blood pressure 147/75 during dialysis     LOS: 7 Lynleigh Kovack 8/18/202510:53 AM

## 2023-11-01 NOTE — Progress Notes (Signed)
 Occupational Therapy Treatment Patient Details Name: Adam Howell MRN: [id number] DOB: 1960-04-14 Today's Date: 11/01/2023   History of present illness Pt is a 63 y/o M presenting to ED with c/o SOB. Workup for HCAP. PMH signficiant for ESRD on HD (MWF), HTN, HLD, DM, CAD, dCHF, a-fib on eloquis, R AKA.   OT comments  Pt seen for OT treatment on this date. Upon arrival to room pt seated EOB finishing lunch, agreeable to tx. Pt confirmed that he was discharging home with daughter today and that she would be available to provide 24/7 support as needed and that he was familiar with home health services.  Education provided on home safety and discussed bathroom set up.  Pt reported that he already has all DME needs at home even when asked about a BSC pt reported that he was fine with no further explanation provided.  This therapist recommended to have daughter assist with ADLs (bathing, dressing, toileting, and transfers) when first returning home to maximize safety and pt verbalized understanding. Pt was making some progress toward goals, will continue to follow POC. Discharge recommendation remains appropriate.        If plan is discharge home, recommend the following:  A lot of help with walking and/or transfers;A lot of help with bathing/dressing/bathroom;Assistance with cooking/housework;Supervision due to cognitive status;Assist for transportation;Help with stairs or ramp for entrance   Equipment Recommendations   (When asked about equipment at home, pt reported he has the DME he needs already.)    Recommendations for Other Services      Precautions / Restrictions         Mobility Bed Mobility Overal bed mobility: Needs Assistance Bed Mobility: Sit to Supine       Sit to supine: Supervision   General bed mobility comments: completed without assistance using hand rails.    Transfers                         Balance Overall balance assessment: Needs assistance    Sitting balance-Leahy Scale: Good                                     ADL either performed or assessed with clinical judgement   ADL   Eating/Feeding: Set up;Sitting                                          Extremity/Trunk Assessment              Vision       Perception     Praxis     Communication     Cognition                                              Cueing      Exercises Other Exercises Other Exercises: education on home safety and upcoming discharge to home with daughter    Shoulder Instructions       General Comments      Pertinent Vitals/ Pain          Home Living  Prior Functioning/Environment              Frequency  Min 2X/week        Progress Toward Goals  OT Goals(current goals can now be found in the care plan section)  Progress towards OT goals: Progressing toward goals     Plan      Co-evaluation                 AM-PAC OT 6 Clicks Daily Activity     Outcome Measure   Help from another person eating meals?: None Help from another person taking care of personal grooming?: A Little Help from another person toileting, which includes using toliet, bedpan, or urinal?: A Lot Help from another person bathing (including washing, rinsing, drying)?: A Lot Help from another person to put on and taking off regular upper body clothing?: A Little Help from another person to put on and taking off regular lower body clothing?: A Lot 6 Click Score: 16    End of Session    OT Visit Diagnosis: Unsteadiness on feet (R26.81);Muscle weakness (generalized) (M62.81)   Activity Tolerance Patient tolerated treatment well   Patient Left in bed;with call bell/phone within reach   Nurse Communication Mobility status        Time: 8587-8578 OT Time Calculation (min): 9 min  Charges: OT General Charges $OT Visit:  1 Visit OT Treatments $Therapeutic Activity: 8-22 mins  Harlene Sharps OTR/L   Harlene LITTIE Sharps 11/01/2023, 3:24 PM

## 2023-11-01 NOTE — Progress Notes (Signed)
 D/C order noted. Contacted [facility] advised of pt d/c today. Pt should resume care on  8/20 d/c summary faxed to clinic for continuation of care.  Suzen Satchel Dialysis Navigator [phone]

## 2023-11-01 NOTE — Progress Notes (Signed)
 Patient requires 2L [place] at rest to maintain oxygen saturations at rest.   SpO2: 100 % O2 Flow Rate (L/min): 2 L/min

## 2023-11-01 NOTE — Progress Notes (Addendum)
 SATURATION QUALIFICATIONS: (This note is used to comply with regulatory documentation for home oxygen)  Patient Saturations on Room Air at Rest = 88%  Patient Saturations on 2 Liters of oxygen at rest= 98%

## 2023-11-01 NOTE — TOC Progression Note (Signed)
 Transition of Care [facility]) - Progression Note    Patient Details  Name: Adam Howell MRN: [id number] Date of Birth: 1960/12/19  [facility]) CM/SW Contact  [place], [place] Phone Number: 11/01/2023, 8:27 AM  Clinical Narrative:     CSW advised that after dialysis, patient will be ready for discharge. Orders for home health placed. CSW recommended that RN order is also placed. CSW will update [facility].   CSW attempted to contact patient daughter, Rosaline Newcomer. A voicemail message was left indicating that choice was needed for DME agency. Home o2 ordered through [facility].   CSW spoke with [place] [employer]. CSW provided necessary information for order to be processed.     Barriers to Discharge: No Barriers Identified               Expected Discharge Plan and Services     Post Acute Care Choice: Home Health Living arrangements for the past 2 months: Single Family Home                                       Social Drivers of Health (SDOH) Interventions SDOH Screenings   Food Insecurity: No Food Insecurity (10/26/2023)  Housing: Low Risk  (10/26/2023)  Transportation Needs: No Transportation Needs (10/26/2023)  Utilities: Not At Risk (10/26/2023)  Alcohol Screen: Low Risk  (11/25/2021)  Depression (PHQ2-9): Low Risk  (04/14/2023)  Financial Resource Strain: Low Risk  (11/25/2021)  Physical Activity: Insufficiently Active (11/25/2021)  Social Connections: Moderately Isolated (10/26/2023)  Stress: No Stress Concern Present (11/25/2021)  Tobacco Use: Low Risk  (10/26/2023)    Readmission Risk Interventions    09/01/2023   11:18 AM 02/16/2023    6:21 PM  Readmission Risk Prevention Plan  Transportation Screening  Complete  PCP or Specialist Appt within 3-5 Days Complete   HRI or Home Care Consult Complete   Social Work Consult for Recovery Care Planning/Counseling Complete   Palliative Care Screening Not Applicable Not Applicable  Medication  Review Oceanographer)  Complete

## 2023-11-01 NOTE — TOC Progression Note (Signed)
 Pt received from HD.  A&O x 2.  BP 127/69. O2 100% via 2L [place]. Meds administered per order.  Pt is resting comfortably.

## 2023-11-01 NOTE — Progress Notes (Signed)
 PT Cancellation Note  Patient Details Name: Adam Howell MRN: [id number] DOB: 12-03-1960   Cancelled Treatment:    Reason Eval/Treat Not Completed: Patient at procedure or test/unavailable. Pt currently out of room for HD. Will re-attempt another time.   Jaidalyn Schillo 11/01/2023, 9:17 AM Corean Dade, PT, DPT, GCS [phone]

## 2023-11-01 NOTE — Discharge Instructions (Signed)
 Please complete your antibiotic treatment for pneumonia until it is completed.  Continue your regular HD schedule.  I recommend you wear your 2L of oxygen continuously. It may be able to wean off in the future.

## 2023-11-01 NOTE — Discharge Summary (Signed)
 Physician Discharge Summary  Patient: Adam Howell FMW:[id number] DOB: Jul 15, 1960   Code Status: Full Code Admit date: 10/25/2023 Discharge date: 11/01/2023 Disposition: Home health, PT, OT, nurse aid, and RN PCP: Valerio Melanie DASEN, NP  Recommendations for Outpatient Follow-up:  Follow up with PCP within 1-2 weeks Regarding general hospital follow up and preventative care Follow up with nephrology for HD  Discharge Diagnoses:  Principal Problem:   HCAP (healthcare-associated pneumonia) Active Problems:   Fluid overload   Chronic diastolic CHF (congestive heart failure) (HCC)   CAD (coronary artery disease)   ESRD (end stage renal disease) on dialysis (HCC)   Atrial fibrillation, chronic (HCC)   Hyperlipidemia associated with type 2 diabetes mellitus (HCC)   Anemia in ESRD (end-stage renal disease) (HCC)   Chronic pain syndrome   Obesity (BMI 30-39.9)   ESRD (end stage renal disease) (HCC)   Community acquired pneumonia  Brief Hospital Course Summary: Zoran Yankee is a 63 y.o. male with medical history significant of  ESRD-HD (MWF), HTN, HLD, diet-controlled DM, CAD, dCHF, A fib on Eliquis , amenia, chronic pain, s/p of right AKA, who presents with SOB and cough x2 days worsening  Requiring O2 and multifical pna on cxray. Admitted for HAP. Started on emperic  IV Abx and continued on routine HD. His blood cultures returned positive for moraxella, MRSA nare swab positive for MRSA. He continued to do well and was able to wean to 2L while resting. Unable to test him for ambulatory pulse ox since he did not have his prosthetic leg and no way of getting it. He was discharged on 2L for home oxygen and continued augmentin  treatment. He may be able to wean from oxygen on follow up.  He was otherwise in his baseline condition and recommended by PT/OT eval to go to SNF but declined so went home with [facility] where he lives with his daughter.   All other chronic conditions were treated  with home medications.    Discharge Condition: Stable, improved Recommended discharge diet: Regular healthy diet  Consultations: Nephrology   Procedures/Studies: HD  Allergies as of 11/01/2023       Reactions   Ivp Dye [iodinated Contrast Media] Anaphylaxis   Tramadol  Rash        Medication List     STOP taking these medications    midodrine  5 MG tablet Commonly known as: PROAMATINE    oxyCODONE -acetaminophen  5-325 MG tablet Commonly known as: PERCOCET/ROXICET       TAKE these medications    acetaminophen  325 MG tablet Commonly known as: TYLENOL  Take 2 tablets (650 mg total) by mouth every 6 (six) hours as needed for mild pain (pain score 1-3), fever or headache (or Fever >/= 101).   amiodarone  200 MG tablet Commonly known as: PACERONE  Take 1 tablet (200 mg total) by mouth 2 (two) times daily.   amoxicillin -clavulanate 250-125 MG tablet Commonly known as: AUGMENTIN  Take 1 tablet by mouth daily for 4 days.   aspirin  EC 81 MG tablet Take 1 tablet (81 mg total) by mouth daily. Swallow whole.   carvedilol  6.25 MG tablet Commonly known as: COREG  Take 1 tablet (6.25 mg total) by mouth daily.   Eliquis  2.5 MG Tabs tablet Generic drug: apixaban  Take 1 tablet (2.5 mg total) by mouth 2 (two) times daily.   iron  polysaccharides 150 MG capsule Commonly known as: NIFEREX Take 1 capsule (150 mg total) by mouth daily.   ondansetron  4 MG tablet Commonly known as: ZOFRAN  Take 4 mg  by mouth every 6 (six) hours as needed for nausea or vomiting.   rosuvastatin  40 MG tablet Commonly known as: CRESTOR  Take 1 tablet (40 mg total) by mouth daily.   sevelamer  carbonate 800 MG tablet Commonly known as: RENVELA  Take 2,400 mg by mouth 3 (three) times daily.   silver  sulfADIAZINE  1 % cream Commonly known as: Silvadene  Apply 1 Application topically daily.   zolpidem  10 MG tablet Commonly known as: AMBIEN  Take 1 tablet (10 mg total) by mouth at bedtime as needed for  sleep.               Durable Medical Equipment  (From admission, onward)           Start     Ordered   11/01/23 1323  For home use only DME oxygen  Once       Question Answer Comment  Length of Need 6 Months   Mode or (Route) Nasal cannula   Liters per Minute 2   Frequency Continuous (stationary and portable oxygen unit needed)   Oxygen conserving device Yes   Oxygen delivery system Gas      11/01/23 1322            Follow-up Information     Valerio Melanie DASEN, NP Follow up.   Specialty: Nurse Practitioner Why: hospital follow up Contact information: [address] [place] [address] [phone]                 Subjective   Pt reports no complaints. He denies SOB or chest pain.   All questions and concerns were addressed at time of discharge.  Objective  Blood pressure 127/69, pulse (!) 55, temperature 98.4 F (36.9 C), resp. rate 19, height 6' 3 (1.905 m), weight 105.8 kg, SpO2 100%.   General: Pt is alert, awake, not in acute distress Cardiovascular: RRR, S1/S2 +, no rubs, no gallops Respiratory: CTA bilaterally, no wheezing, no rhonchi Abdominal: Soft, NT, ND, bowel sounds + Extremities: no edema, no cyanosis  The results of significant diagnostics from this hospitalization (including imaging, microbiology, ancillary and laboratory) are listed below for reference.   Imaging studies: DG Chest Portable 1 View Result Date: 10/25/2023 CLINICAL DATA:  Shortness of breath EXAM: PORTABLE CHEST 1 VIEW COMPARISON:  Chest x-ray 08/11/2023 FINDINGS: Left-sided central venous catheter tip projects over the SVC. The heart is enlarged. There are small bilateral pleural effusions. There are minimal patchy retrocardiac opacities. There is no pneumothorax or acute fracture. IMPRESSION: 1. Cardiomegaly with small bilateral pleural effusions. 2. Minimal patchy retrocardiac opacities, atelectasis versus infiltrate. Electronically Signed   By: Greig Pique M.D.    On: 10/25/2023 21:32    Labs: Basic Metabolic Panel: Recent Labs  Lab 10/25/23 1745 10/26/23 0418 10/27/23 0317 10/29/23 0740 11/01/23 0450  NA 140 139 136 137 135  K 3.6 3.8 4.0 4.5 4.4  CL 95* 99 95* 94* 95*  CO2 30 30 27 31 27   GLUCOSE 87 96 96 113* 109*  BUN 26* 32* 44* 37* 50*  CREATININE 5.39* 6.14* 7.78* 7.76* 9.32*  CALCIUM  8.8* 8.2* 8.1* 8.7* 8.5*  PHOS  --   --   --  3.3 3.7   CBC: Recent Labs  Lab 10/25/23 1745 10/26/23 0418 10/27/23 0317 10/29/23 0730 11/01/23 0450  WBC 7.9 8.1 4.7 4.7 4.0  NEUTROABS 6.7  --   --   --   --   HGB 9.1* 7.6* 7.4* 7.6* 7.4*  HCT 29.6* 25.2* 24.4*  25.2* 24.0*  MCV 85.8 86.6 87.1 87.5 84.8  PLT 170 132* 119* 112* 120*   Microbiology: Results for orders placed or performed during the hospital encounter of 10/25/23  Blood Culture (routine x 2)     Status: None   Collection Time: 10/25/23 10:29 PM   Specimen: BLOOD  Result Value Ref Range Status   Specimen Description BLOOD BLOOD RIGHT ARM  Final   Special Requests   Final    BOTTLES DRAWN AEROBIC AND ANAEROBIC Blood Culture adequate volume   Culture   Final    NO GROWTH 5 DAYS Performed at [facility], [address] [place], [place] [address]    Report Status 10/30/2023 FINAL  Final  Blood Culture (routine x 2)     Status: Abnormal   Collection Time: 10/25/23 10:29 PM   Specimen: BLOOD  Result Value Ref Range Status   Specimen Description   Final    BLOOD BLOOD RIGHT HAND Performed at [facility], [address] [place], [place] [address]    Special Requests   Final    Blood Culture adequate volume Performed at [facility], [address] [place], [place] [address]    Culture  Setup Time (A)  Final    GRAM NEGATIVE DIPLOCOCCI AEROBIC BOTTLE ONLY CRITICAL RESULT CALLED TO, READ BACK BY AND VERIFIED WITH: JSAON ROBBINS 2150 10/27/23 [facility] CORRECTED RESULTS PREVIOUSLY REPORTED AS: GRAM POSITIVE COCCI CRITICAL RESULT CALLED TO, READ  BACK BY AND VERIFIED WITH: PHARMD TREY GREENWOOD [id number] AT 1124 BY EC    Culture (A)  Final    MORAXELLA CATARRHALIS(BRANHAMELLA) BETA LACTAMASE POSITIVE Performed at [facility], [address] [place], [place] [address]    Report Status 10/30/2023 FINAL  Final  Blood Culture ID Panel (Reflexed)     Status: None   Collection Time: 10/25/23 10:29 PM  Result Value Ref Range Status   Enterococcus faecalis NOT DETECTED NOT DETECTED Final   Enterococcus Faecium NOT DETECTED NOT DETECTED Final   Listeria monocytogenes NOT DETECTED NOT DETECTED Final   Staphylococcus species NOT DETECTED NOT DETECTED Final   Staphylococcus aureus (BCID) NOT DETECTED NOT DETECTED Final   Staphylococcus epidermidis NOT DETECTED NOT DETECTED Final   Staphylococcus lugdunensis NOT DETECTED NOT DETECTED Final   Streptococcus species NOT DETECTED NOT DETECTED Final   Streptococcus agalactiae NOT DETECTED NOT DETECTED Final   Streptococcus pneumoniae NOT DETECTED NOT DETECTED Final   Streptococcus pyogenes NOT DETECTED NOT DETECTED Final   A.calcoaceticus-baumannii NOT DETECTED NOT DETECTED Final   Bacteroides fragilis NOT DETECTED NOT DETECTED Final   Enterobacterales NOT DETECTED NOT DETECTED Final   Enterobacter cloacae complex NOT DETECTED NOT DETECTED Final   Escherichia coli NOT DETECTED NOT DETECTED Final   Klebsiella aerogenes NOT DETECTED NOT DETECTED Final   Klebsiella oxytoca NOT DETECTED NOT DETECTED Final   Klebsiella pneumoniae NOT DETECTED NOT DETECTED Final   Proteus species NOT DETECTED NOT DETECTED Final   Salmonella species NOT DETECTED NOT DETECTED Final   Serratia marcescens NOT DETECTED NOT DETECTED Final   Haemophilus influenzae NOT DETECTED NOT DETECTED Final   Neisseria meningitidis NOT DETECTED NOT DETECTED Final   Pseudomonas aeruginosa NOT DETECTED NOT DETECTED Final   Stenotrophomonas maltophilia NOT DETECTED NOT DETECTED Final   Candida albicans NOT DETECTED NOT  DETECTED Final   Candida auris NOT DETECTED NOT DETECTED Final   Candida glabrata NOT DETECTED NOT DETECTED Final   Candida krusei NOT DETECTED NOT DETECTED Final  Candida parapsilosis NOT DETECTED NOT DETECTED Final   Candida tropicalis NOT DETECTED NOT DETECTED Final   Cryptococcus neoformans/gattii NOT DETECTED NOT DETECTED Final    Comment: Performed at [facility], [address] [place], [place] [address]  Resp panel by RT-PCR (RSV, Flu A&B, Covid) Anterior Nasal Swab     Status: None   Collection Time: 10/25/23 11:36 PM   Specimen: Anterior Nasal Swab  Result Value Ref Range Status   SARS Coronavirus 2 by RT PCR NEGATIVE NEGATIVE Final    Comment: (NOTE) SARS-CoV-2 target nucleic acids are NOT DETECTED.  The SARS-CoV-2 RNA is generally detectable in upper respiratory specimens during the acute phase of infection. The lowest concentration of SARS-CoV-2 viral copies this assay can detect is 138 copies/mL. A negative result does not preclude SARS-Cov-2 infection and should not be used as the sole basis for treatment or other patient management decisions. A negative result may occur with  improper specimen collection/handling, submission of specimen other than nasopharyngeal swab, presence of viral mutation(s) within the areas targeted by this assay, and inadequate number of viral copies(<138 copies/mL). A negative result must be combined with clinical observations, patient history, and epidemiological information. The expected result is Negative.  Fact Sheet for Patients:  BloggerCourse.com  Fact Sheet for Healthcare Providers:  SeriousBroker.it  This test is no t yet approved or cleared by the [place]  FDA and  has been authorized for detection and/or diagnosis of SARS-CoV-2 by FDA under an Emergency Use Authorization (EUA). This EUA will remain  in effect (meaning this test can be used) for the duration of  the COVID-19 declaration under Section 564(b)(1) of the Act, 21 U.S.C.section 360bbb-3(b)(1), unless the authorization is terminated  or revoked sooner.       Influenza A by PCR NEGATIVE NEGATIVE Final   Influenza B by PCR NEGATIVE NEGATIVE Final    Comment: (NOTE) The Xpert Xpress SARS-CoV-2/FLU/RSV plus assay is intended as an aid in the diagnosis of influenza from Nasopharyngeal swab specimens and should not be used as a sole basis for treatment. Nasal washings and aspirates are unacceptable for Xpert Xpress SARS-CoV-2/FLU/RSV testing.  Fact Sheet for Patients: BloggerCourse.com  Fact Sheet for Healthcare Providers: SeriousBroker.it  This test is not yet approved or cleared by the [place]  FDA and has been authorized for detection and/or diagnosis of SARS-CoV-2 by FDA under an Emergency Use Authorization (EUA). This EUA will remain in effect (meaning this test can be used) for the duration of the COVID-19 declaration under Section 564(b)(1) of the Act, 21 U.S.C. section 360bbb-3(b)(1), unless the authorization is terminated or revoked.     Resp Syncytial Virus by PCR NEGATIVE NEGATIVE Final    Comment: (NOTE) Fact Sheet for Patients: BloggerCourse.com  Fact Sheet for Healthcare Providers: SeriousBroker.it  This test is not yet approved or cleared by the [place]  FDA and has been authorized for detection and/or diagnosis of SARS-CoV-2 by FDA under an Emergency Use Authorization (EUA). This EUA will remain in effect (meaning this test can be used) for the duration of the COVID-19 declaration under Section 564(b)(1) of the Act, 21 U.S.C. section 360bbb-3(b)(1), unless the authorization is terminated or revoked.  Performed at [facility], [address] [place], [place] [address]   MRSA Next Gen by PCR, Nasal     Status: Abnormal   Collection Time:  10/26/23  5:15 AM   Specimen: Nasal Mucosa; Nasal Swab  Result Value Ref Range Status   MRSA by PCR Next  Gen DETECTED (A) NOT DETECTED Final    Comment: RESULT CALLED TO, READ BACK BY AND VERIFIED WITH: L'BELLLE BREEZE 10/26/23 0855 MW (NOTE) The GeneXpert MRSA Assay (FDA approved for NASAL specimens only), is one component of a comprehensive MRSA colonization surveillance program. It is not intended to diagnose MRSA infection nor to guide or monitor treatment for MRSA infections. Test performance is not FDA approved in patients less than 60 years old. Performed at [facility], [address] [place], [place] [address]     Time coordinating discharge: Over 30 minutes  [name], MD  [facility] 11/01/2023, 1:58 PM

## 2023-11-02 ENCOUNTER — Encounter (INDEPENDENT_AMBULATORY_CARE_PROVIDER_SITE_OTHER)

## 2023-11-02 ENCOUNTER — Ambulatory Visit (INDEPENDENT_AMBULATORY_CARE_PROVIDER_SITE_OTHER): Admitting: Nurse Practitioner

## 2023-11-02 ENCOUNTER — Other Ambulatory Visit (INDEPENDENT_AMBULATORY_CARE_PROVIDER_SITE_OTHER): Payer: Self-pay | Admitting: Vascular Surgery

## 2023-11-02 DIAGNOSIS — N186 End stage renal disease: Secondary | ICD-10-CM

## 2023-11-02 DIAGNOSIS — I5022 Chronic systolic (congestive) heart failure: Secondary | ICD-10-CM | POA: Diagnosis not present

## 2023-11-03 DIAGNOSIS — E8779 Other fluid overload: Secondary | ICD-10-CM | POA: Diagnosis not present

## 2023-11-03 DIAGNOSIS — N2581 Secondary hyperparathyroidism of renal origin: Secondary | ICD-10-CM | POA: Diagnosis not present

## 2023-11-03 DIAGNOSIS — N186 End stage renal disease: Secondary | ICD-10-CM | POA: Diagnosis not present

## 2023-11-03 DIAGNOSIS — D689 Coagulation defect, unspecified: Secondary | ICD-10-CM | POA: Diagnosis not present

## 2023-11-03 DIAGNOSIS — R52 Pain, unspecified: Secondary | ICD-10-CM | POA: Diagnosis not present

## 2023-11-03 DIAGNOSIS — D509 Iron deficiency anemia, unspecified: Secondary | ICD-10-CM | POA: Diagnosis not present

## 2023-11-03 DIAGNOSIS — Z992 Dependence on renal dialysis: Secondary | ICD-10-CM | POA: Diagnosis not present

## 2023-11-05 DIAGNOSIS — Z992 Dependence on renal dialysis: Secondary | ICD-10-CM | POA: Diagnosis not present

## 2023-11-08 DIAGNOSIS — R52 Pain, unspecified: Secondary | ICD-10-CM | POA: Diagnosis not present

## 2023-11-10 DIAGNOSIS — D689 Coagulation defect, unspecified: Secondary | ICD-10-CM | POA: Diagnosis not present

## 2023-11-10 DIAGNOSIS — N186 End stage renal disease: Secondary | ICD-10-CM | POA: Diagnosis not present

## 2023-11-10 DIAGNOSIS — R52 Pain, unspecified: Secondary | ICD-10-CM | POA: Diagnosis not present

## 2023-11-10 DIAGNOSIS — D509 Iron deficiency anemia, unspecified: Secondary | ICD-10-CM | POA: Diagnosis not present

## 2023-11-10 DIAGNOSIS — E8779 Other fluid overload: Secondary | ICD-10-CM | POA: Diagnosis not present

## 2023-11-10 DIAGNOSIS — N2581 Secondary hyperparathyroidism of renal origin: Secondary | ICD-10-CM | POA: Diagnosis not present

## 2023-11-11 DIAGNOSIS — D689 Coagulation defect, unspecified: Secondary | ICD-10-CM | POA: Diagnosis not present

## 2023-11-11 DIAGNOSIS — R52 Pain, unspecified: Secondary | ICD-10-CM | POA: Diagnosis not present

## 2023-11-11 DIAGNOSIS — E8779 Other fluid overload: Secondary | ICD-10-CM | POA: Diagnosis not present

## 2023-11-11 DIAGNOSIS — N2581 Secondary hyperparathyroidism of renal origin: Secondary | ICD-10-CM | POA: Diagnosis not present

## 2023-11-11 DIAGNOSIS — Z992 Dependence on renal dialysis: Secondary | ICD-10-CM | POA: Diagnosis not present

## 2023-11-11 DIAGNOSIS — D509 Iron deficiency anemia, unspecified: Secondary | ICD-10-CM | POA: Diagnosis not present

## 2023-11-12 DIAGNOSIS — N186 End stage renal disease: Secondary | ICD-10-CM | POA: Diagnosis not present

## 2023-11-14 DIAGNOSIS — N186 End stage renal disease: Secondary | ICD-10-CM | POA: Diagnosis not present

## 2023-11-14 DIAGNOSIS — Z992 Dependence on renal dialysis: Secondary | ICD-10-CM | POA: Diagnosis not present

## 2023-11-14 DIAGNOSIS — I120 Hypertensive chronic kidney disease with stage 5 chronic kidney disease or end stage renal disease: Secondary | ICD-10-CM | POA: Diagnosis not present

## 2023-11-17 ENCOUNTER — Other Ambulatory Visit: Payer: Self-pay | Admitting: Nurse Practitioner

## 2023-11-17 DIAGNOSIS — N2581 Secondary hyperparathyroidism of renal origin: Secondary | ICD-10-CM | POA: Diagnosis not present

## 2023-11-17 DIAGNOSIS — Z992 Dependence on renal dialysis: Secondary | ICD-10-CM | POA: Diagnosis not present

## 2023-11-17 DIAGNOSIS — R52 Pain, unspecified: Secondary | ICD-10-CM | POA: Diagnosis not present

## 2023-11-17 DIAGNOSIS — E1129 Type 2 diabetes mellitus with other diabetic kidney complication: Secondary | ICD-10-CM | POA: Diagnosis not present

## 2023-11-17 DIAGNOSIS — D689 Coagulation defect, unspecified: Secondary | ICD-10-CM | POA: Diagnosis not present

## 2023-11-17 NOTE — Telephone Encounter (Unsigned)
 Copied from CRM #[id number]. Topic: Clinical - Medication Refill >> Nov 17, 2023  3:53 PM Turkey B wrote: Medication: zolpidem  (AMBIEN ) 10 MG tablet   Has the patient contacted their pharmacy? yes Nothing showed in system for refill request  This is the patient's preferred pharmacy:  [facility] [address]), [place] - [address] [place]) [place] [address] Phone: [phone] Fax: [phone]    Is this the correct pharmacy for this prescription? yes   Has the prescription been filled recently? no  Is the patient out of the medication? yes  Has the patient been seen for an appointment in the last year OR does the patient have an upcoming appointment? yes  Can we respond through MyChart? no  Agent: Please be advised that Rx refills may take up to 3 business days. We ask that you follow-up with your pharmacy.

## 2023-11-18 MED ORDER — ZOLPIDEM TARTRATE 10 MG PO TABS: 10.0000 mg | ORAL_TABLET | Freq: Every evening | ORAL | 1 refills | Status: DC | PRN

## 2023-11-18 NOTE — Telephone Encounter (Signed)
 Requested medication (s) are due for refill today -yes  Requested medication (s) are on the active medication list -yes  Future visit scheduled -yes  Last refill: 09/16/23 #30 1RF  Notes to clinic: non delegated Rx  Requested Prescriptions  Pending Prescriptions Disp Refills   zolpidem  (AMBIEN ) 10 MG tablet 30 tablet 1    Sig: Take 1 tablet (10 mg total) by mouth at bedtime as needed for sleep.     Not Delegated - Psychiatry:  Anxiolytics/Hypnotics Failed - 11/18/2023 12:15 PM      Failed - This refill cannot be delegated      Failed - Urine Drug Screen completed in last 360 days      Passed - Valid encounter within last 6 months    Recent Outpatient Visits           2 months ago Aspiration pneumonia, unspecified aspiration pneumonia type, unspecified laterality, unspecified part of lung (HCC)   [facility] [place], Melanie T, NP                 Requested Prescriptions  Pending Prescriptions Disp Refills   zolpidem  (AMBIEN ) 10 MG tablet 30 tablet 1    Sig: Take 1 tablet (10 mg total) by mouth at bedtime as needed for sleep.     Not Delegated - Psychiatry:  Anxiolytics/Hypnotics Failed - 11/18/2023 12:15 PM      Failed - This refill cannot be delegated      Failed - Urine Drug Screen completed in last 360 days      Passed - Valid encounter within last 6 months    Recent Outpatient Visits           2 months ago Aspiration pneumonia, unspecified aspiration pneumonia type, unspecified laterality, unspecified part of lung (HCC)   [facility] [facility] [place], Melanie DASEN, NP

## 2023-11-19 DIAGNOSIS — E1129 Type 2 diabetes mellitus with other diabetic kidney complication: Secondary | ICD-10-CM | POA: Diagnosis not present

## 2023-11-19 DIAGNOSIS — D689 Coagulation defect, unspecified: Secondary | ICD-10-CM | POA: Diagnosis not present

## 2023-11-19 DIAGNOSIS — Z992 Dependence on renal dialysis: Secondary | ICD-10-CM | POA: Diagnosis not present

## 2023-11-19 DIAGNOSIS — N2581 Secondary hyperparathyroidism of renal origin: Secondary | ICD-10-CM | POA: Diagnosis not present

## 2023-11-19 DIAGNOSIS — N186 End stage renal disease: Secondary | ICD-10-CM | POA: Diagnosis not present

## 2023-11-19 DIAGNOSIS — R52 Pain, unspecified: Secondary | ICD-10-CM | POA: Diagnosis not present

## 2023-11-22 DIAGNOSIS — E1129 Type 2 diabetes mellitus with other diabetic kidney complication: Secondary | ICD-10-CM | POA: Diagnosis not present

## 2023-11-24 DIAGNOSIS — E1129 Type 2 diabetes mellitus with other diabetic kidney complication: Secondary | ICD-10-CM | POA: Diagnosis not present

## 2023-11-24 DIAGNOSIS — N186 End stage renal disease: Secondary | ICD-10-CM | POA: Diagnosis not present

## 2023-11-24 DIAGNOSIS — Z992 Dependence on renal dialysis: Secondary | ICD-10-CM | POA: Diagnosis not present

## 2023-11-24 DIAGNOSIS — N2581 Secondary hyperparathyroidism of renal origin: Secondary | ICD-10-CM | POA: Diagnosis not present

## 2023-11-24 DIAGNOSIS — D689 Coagulation defect, unspecified: Secondary | ICD-10-CM | POA: Diagnosis not present

## 2023-11-26 DIAGNOSIS — D689 Coagulation defect, unspecified: Secondary | ICD-10-CM | POA: Diagnosis not present

## 2023-11-26 DIAGNOSIS — N186 End stage renal disease: Secondary | ICD-10-CM | POA: Diagnosis not present

## 2023-11-26 DIAGNOSIS — N2581 Secondary hyperparathyroidism of renal origin: Secondary | ICD-10-CM | POA: Diagnosis not present

## 2023-11-26 DIAGNOSIS — R52 Pain, unspecified: Secondary | ICD-10-CM | POA: Diagnosis not present

## 2023-11-26 DIAGNOSIS — Z992 Dependence on renal dialysis: Secondary | ICD-10-CM | POA: Diagnosis not present

## 2023-11-27 IMAGING — DX DG CHEST 1V PORT
1 series · 2 of 2 positions shown · non-contrast
Comparison: None.

CLINICAL DATA: Altered mental status

EXAM:
PORTABLE CHEST 1 VIEW

[Series 1: chest ap · 0.14mm/px · 2 of 2 slices shown]
[im 1/2]
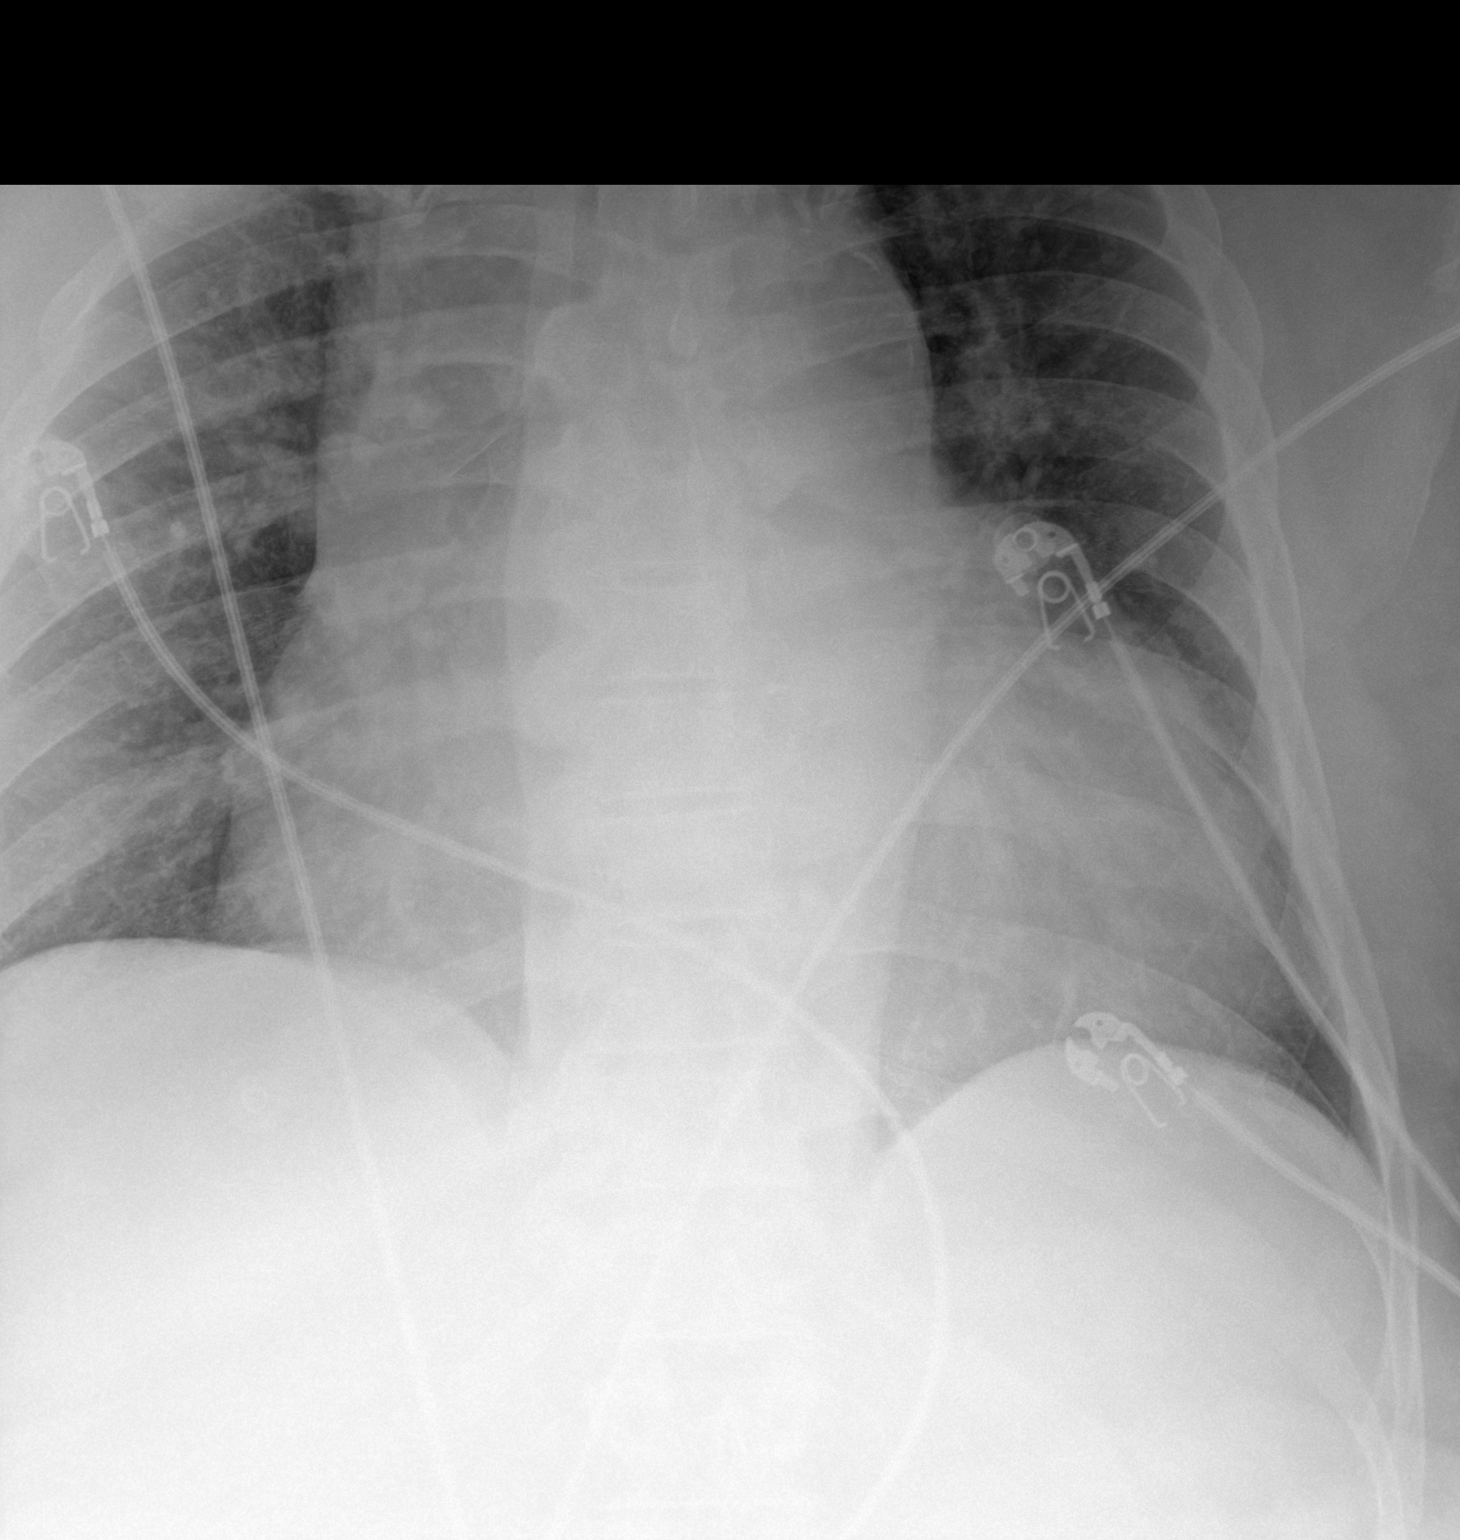
[im 2/2]
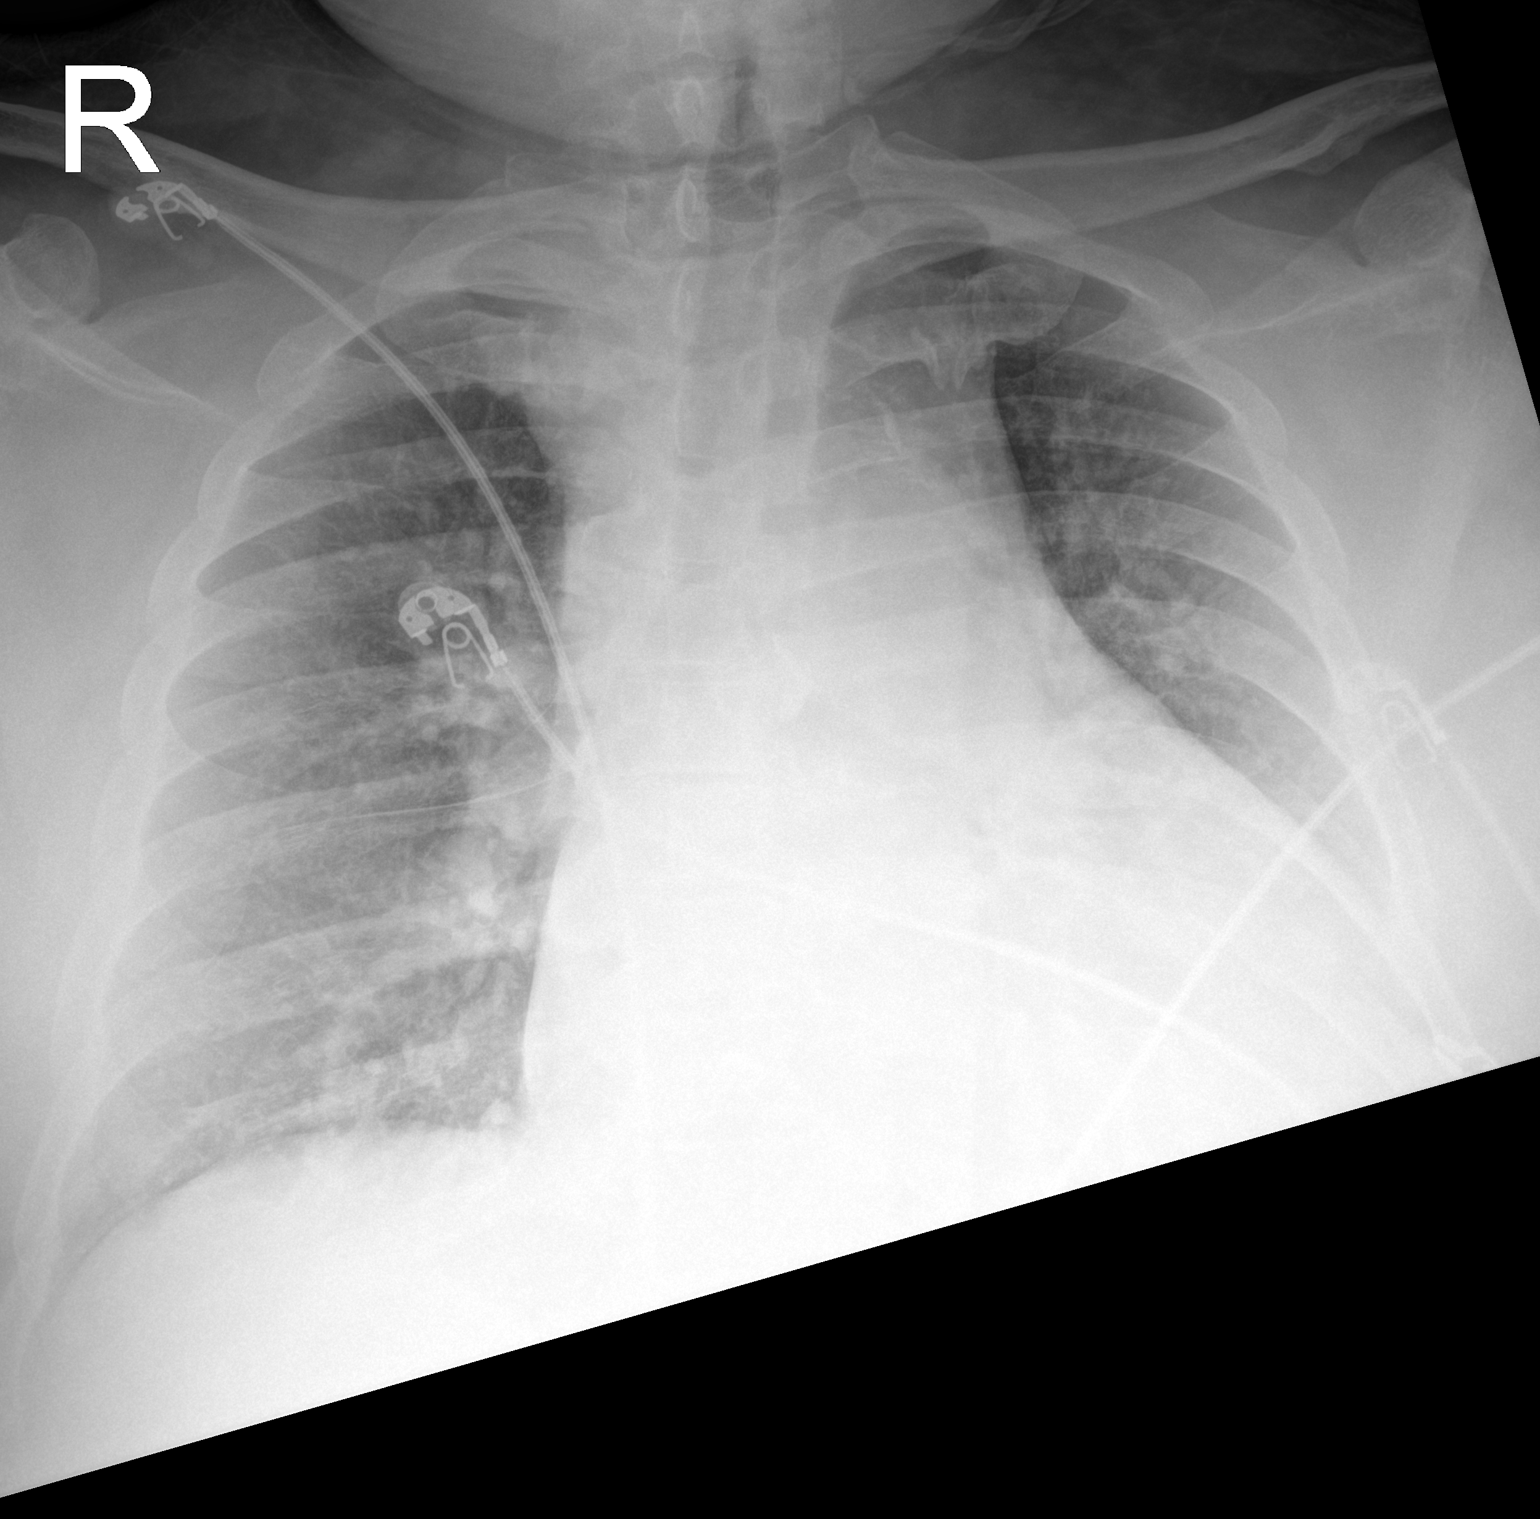

[2 of 2 positions shown; findings below may reference images not displayed]

FINDINGS: Cardiomegaly. Atherosclerotic calcification of the aortic knob.
Pulmonary vascular congestion. Mild bilateral interstitial
prominence. No appreciable pleural fluid collection. No
pneumothorax.
IMPRESSION: Cardiomegaly with pulmonary vascular congestion and mild bilateral
interstitial prominence. Findings suggestive of CHF with mild
interstitial edema.

## 2023-11-29 DIAGNOSIS — N2581 Secondary hyperparathyroidism of renal origin: Secondary | ICD-10-CM | POA: Diagnosis not present

## 2023-11-29 DIAGNOSIS — N186 End stage renal disease: Secondary | ICD-10-CM | POA: Diagnosis not present

## 2023-11-29 DIAGNOSIS — R52 Pain, unspecified: Secondary | ICD-10-CM | POA: Diagnosis not present

## 2023-11-29 DIAGNOSIS — Z992 Dependence on renal dialysis: Secondary | ICD-10-CM | POA: Diagnosis not present

## 2023-11-29 DIAGNOSIS — E1129 Type 2 diabetes mellitus with other diabetic kidney complication: Secondary | ICD-10-CM | POA: Diagnosis not present

## 2023-12-01 DIAGNOSIS — D689 Coagulation defect, unspecified: Secondary | ICD-10-CM | POA: Diagnosis not present

## 2023-12-03 DIAGNOSIS — E1129 Type 2 diabetes mellitus with other diabetic kidney complication: Secondary | ICD-10-CM | POA: Diagnosis not present

## 2023-12-03 DIAGNOSIS — R52 Pain, unspecified: Secondary | ICD-10-CM | POA: Diagnosis not present

## 2023-12-03 DIAGNOSIS — D689 Coagulation defect, unspecified: Secondary | ICD-10-CM | POA: Diagnosis not present

## 2023-12-03 DIAGNOSIS — N2581 Secondary hyperparathyroidism of renal origin: Secondary | ICD-10-CM | POA: Diagnosis not present

## 2023-12-03 DIAGNOSIS — N186 End stage renal disease: Secondary | ICD-10-CM | POA: Diagnosis not present

## 2023-12-03 DIAGNOSIS — Z992 Dependence on renal dialysis: Secondary | ICD-10-CM | POA: Diagnosis not present

## 2023-12-03 DIAGNOSIS — I5022 Chronic systolic (congestive) heart failure: Secondary | ICD-10-CM | POA: Diagnosis not present

## 2023-12-06 DIAGNOSIS — R52 Pain, unspecified: Secondary | ICD-10-CM | POA: Diagnosis not present

## 2023-12-07 ENCOUNTER — Other Ambulatory Visit: Payer: Self-pay | Admitting: Nurse Practitioner

## 2023-12-07 NOTE — Telephone Encounter (Signed)
 Copied from CRM #[id number]. Topic: Clinical - Medication Refill >> Dec 07, 2023  3:37 PM Rosaria E wrote: Medication: acetaminophen  (TYLENOL ) 325 MG tablet  amiodarone  (PACERONE ) 200 MG tablet aspirin  EC 81 MG tablet  carvedilol  (COREG ) 6.25 MG tablet  ELIQUIS  2.5 MG TABS tablet  iron  polysaccharides (NIFEREX) 150 MG capsule ondansetron  (ZOFRAN ) 4 MG tablet  rosuvastatin  (CRESTOR ) 40 MG tablet sevelamer  carbonate (RENVELA ) 800 MG tablet silver  sulfADIAZINE  (SILVADENE ) 1 % cream  zolpidem  (AMBIEN ) 10 MG tablet   Has the patient contacted their pharmacy? Yes (Agent: If no, request that the patient contact the pharmacy for the refill. If patient does not wish to contact the pharmacy document the reason why and proceed with request.) (Agent: If yes, when and what did the pharmacy advise?)  This is the patient's preferred pharmacy:  [facility] [address]), [place] - [address]) [place] [address] Phone: [phone] Fax: [phone]  Is this the correct pharmacy for this prescription? Yes If no, delete pharmacy and type the correct one.   Has the prescription been filled recently? Yes  Is the patient out of the medication? Yes  Has the patient been seen for an appointment in the last year OR does the patient have an upcoming appointment? Yes  Can we respond through MyChart? Yes  Agent: Please be advised that Rx refills may take up to 3 business days. We ask that you follow-up with your pharmacy.

## 2023-12-08 DIAGNOSIS — N2581 Secondary hyperparathyroidism of renal origin: Secondary | ICD-10-CM | POA: Diagnosis not present

## 2023-12-09 MED ORDER — ZOLPIDEM TARTRATE 10 MG PO TABS: 10.0000 mg | ORAL_TABLET | Freq: Every evening | ORAL | 1 refills | Status: DC | PRN

## 2023-12-09 MED ORDER — ONDANSETRON HCL 4 MG PO TABS: 4.0000 mg | ORAL_TABLET | Freq: Four times a day (QID) | ORAL | 2 refills | Status: AC | PRN

## 2023-12-09 MED ORDER — SEVELAMER CARBONATE 800 MG PO TABS: 2400.0000 mg | ORAL_TABLET | Freq: Three times a day (TID) | ORAL | 2 refills | Status: DC

## 2023-12-09 MED ORDER — POLYSACCHARIDE IRON COMPLEX 150 MG PO CAPS: 150.0000 mg | ORAL_CAPSULE | Freq: Every day | ORAL | 2 refills | Status: DC

## 2023-12-09 NOTE — Telephone Encounter (Signed)
 Too soon for refill, LRF 09/20/23 FOR 90 AND 2 RF.  Requested Prescriptions  Pending Prescriptions Disp Refills   amiodarone  (PACERONE ) 200 MG tablet 180 tablet 4    Sig: Take 1 tablet (200 mg total) by mouth 2 (two) times daily.     Not Delegated - Cardiovascular: Antiarrhythmic Agents - amiodarone  Failed - 12/09/2023  8:17 AM      Failed - This refill cannot be delegated      Failed - Manual Review: Eye exam recommended every 12 months      Failed - TSH in normal range and within 360 days    TSH  Date Value Ref Range Status  05/12/2022 4.090 0.450 - 4.500 uIU/mL Final         Passed - Mg Level in normal range and within 360 days    Magnesium   Date Value Ref Range Status  08/09/2023 2.4 1.7 - 2.4 mg/dL Final    Comment:    Performed at [facility], [address] [place], [place] [address]         Passed - K in normal range and within 180 days    Potassium  Date Value Ref Range Status  11/01/2023 4.4 3.5 - 5.1 mmol/L Final   Potassium [facility] vascular lab)  Date Value Ref Range Status  08/27/2022 5.2 (H) 3.5 - 5.1 mmol/L Final    Comment:    Performed at [facility], [address], [place], [place] [address]         Passed - AST in normal range and within 180 days    AST  Date Value Ref Range Status  10/25/2023 41 15 - 41 U/L Final         Passed - ALT in normal range and within 180 days    ALT  Date Value Ref Range Status  10/25/2023 29 0 - 44 U/L Final         Passed - Patient had ECG in the last 180 days      Passed - Patient is not pregnant      Passed - Last BP in normal range    BP Readings from Last 1 Encounters:  11/01/23 122/67         Passed - Last Heart Rate in normal range    Pulse Readings from Last 1 Encounters:  11/01/23 (!) 55         Passed - Valid encounter within last 6 months    Recent Outpatient Visits           2 months ago Aspiration pneumonia, unspecified aspiration pneumonia type, unspecified laterality,  unspecified part of lung (HCC)   [facility] [place], [place] T, NP              Passed - Patient had chest x-ray within the last 6 months       carvedilol  (COREG ) 6.25 MG tablet 90 tablet 2    Sig: Take 1 tablet (6.25 mg total) by mouth daily.     Cardiovascular: Beta Blockers 3 Failed - 12/09/2023  8:17 AM      Failed - Cr in normal range and within 360 days    Creatinine, Ser  Date Value Ref Range Status  11/01/2023 9.32 (H) 0.61 - 1.24 mg/dL Final         Passed - AST in normal range and within 360 days    AST  Date Value Ref Range Status  10/25/2023 41  15 - 41 U/L Final         Passed - ALT in normal range and within 360 days    ALT  Date Value Ref Range Status  10/25/2023 29 0 - 44 U/L Final         Passed - Last BP in normal range    BP Readings from Last 1 Encounters:  11/01/23 122/67         Passed - Last Heart Rate in normal range    Pulse Readings from Last 1 Encounters:  11/01/23 (!) 55         Passed - Valid encounter within last 6 months    Recent Outpatient Visits           2 months ago Aspiration pneumonia, unspecified aspiration pneumonia type, unspecified laterality, unspecified part of lung (HCC)   [facility] [place], Jolene T, NP               ELIQUIS  2.5 MG TABS tablet 180 tablet 1    Sig: Take 1 tablet (2.5 mg total) by mouth 2 (two) times daily.     Hematology:  Anticoagulants - apixaban  Failed - 12/09/2023  8:17 AM      Failed - PLT in normal range and within 360 days    Platelets  Date Value Ref Range Status  11/01/2023 120 (L) 150 - 400 K/uL Final  01/12/2023 257 150 - 450 x10E3/uL Final         Failed - HGB in normal range and within 360 days    Hemoglobin  Date Value Ref Range Status  11/01/2023 7.4 (L) 13.0 - 17.0 g/dL Final  89/70/7975 83.1 13.0 - 17.7 g/dL Final         Failed - HCT in normal range and within 360 days    HCT  Date Value Ref Range Status   11/01/2023 24.0 (L) 39.0 - 52.0 % Final   Hematocrit  Date Value Ref Range Status  01/12/2023 51.0 37.5 - 51.0 % Final         Failed - Cr in normal range and within 360 days    Creatinine, Ser  Date Value Ref Range Status  11/01/2023 9.32 (H) 0.61 - 1.24 mg/dL Final         Passed - AST in normal range and within 360 days    AST  Date Value Ref Range Status  10/25/2023 41 15 - 41 U/L Final         Passed - ALT in normal range and within 360 days    ALT  Date Value Ref Range Status  10/25/2023 29 0 - 44 U/L Final         Passed - Valid encounter within last 12 months    Recent Outpatient Visits           2 months ago Aspiration pneumonia, unspecified aspiration pneumonia type, unspecified laterality, unspecified part of lung (HCC)   [facility] [place], Jolene T, NP               iron  polysaccharides (NIFEREX) 150 MG capsule      Sig: Take 1 capsule (150 mg total) by mouth daily.     Off-Protocol Failed - 12/09/2023  8:17 AM      Failed - Medication not assigned to a protocol, review manually.      Passed - Valid encounter within last 12 months    Recent Outpatient Visits  2 months ago Aspiration pneumonia, unspecified aspiration pneumonia type, unspecified laterality, unspecified part of lung (HCC)   [facility] [place], [place] T, NP               ondansetron  (ZOFRAN ) 4 MG tablet 20 tablet     Sig: Take 1 tablet (4 mg total) by mouth every 6 (six) hours as needed for nausea or vomiting.     Not Delegated - Gastroenterology: Antiemetics - ondansetron  Failed - 12/09/2023  8:17 AM      Failed - This refill cannot be delegated      Passed - AST in normal range and within 360 days    AST  Date Value Ref Range Status  10/25/2023 41 15 - 41 U/L Final         Passed - ALT in normal range and within 360 days    ALT  Date Value Ref Range Status  10/25/2023 29 0 - 44 U/L Final         Passed  - Valid encounter within last 6 months    Recent Outpatient Visits           2 months ago Aspiration pneumonia, unspecified aspiration pneumonia type, unspecified laterality, unspecified part of lung (HCC)   [facility] [place], Jolene T, NP               rosuvastatin  (CRESTOR ) 40 MG tablet 90 tablet 1    Sig: Take 1 tablet (40 mg total) by mouth daily.     Cardiovascular:  Antilipid - Statins 2 Failed - 12/09/2023  8:17 AM      Failed - Cr in normal range and within 360 days    Creatinine, Ser  Date Value Ref Range Status  11/01/2023 9.32 (H) 0.61 - 1.24 mg/dL Final         Failed - Lipid Panel in normal range within the last 12 months    Cholesterol, Total  Date Value Ref Range Status  01/12/2023 179 100 - 199 mg/dL Final   LDL Chol Calc (NIH)  Date Value Ref Range Status  01/12/2023 117 (H) 0 - 99 mg/dL Final   HDL  Date Value Ref Range Status  01/12/2023 43 >39 mg/dL Final   Triglycerides  Date Value Ref Range Status  08/06/2023 77 <150 mg/dL Final    Comment:    Performed at [facility], [address], [place], [place] [address]         Passed - Patient is not pregnant      Passed - Valid encounter within last 12 months    Recent Outpatient Visits           2 months ago Aspiration pneumonia, unspecified aspiration pneumonia type, unspecified laterality, unspecified part of lung (HCC)   [facility] [place], Jolene T, NP               sevelamer  carbonate (RENVELA ) 800 MG tablet      Sig: Take 3 tablets (2,400 mg total) by mouth 3 (three) times daily.     Endocrinology:  Phosphate Binders - sevelamer  carbonate Failed - 12/09/2023  8:17 AM      Failed - Albumin  in normal range and within 360 days    Albumin   Date Value Ref Range Status  11/01/2023 2.8 (L) 3.5 - 5.0 g/dL Final  98/70/7974 4.3 3.9 - 4.9 g/dL Final         Failed -  PTH in normal range and within 180 days    No results  found for: IOPTH, PTHINTACTFNA, PTH       Failed - Cr in normal range and within 180 days    Creatinine, Ser  Date Value Ref Range Status  11/01/2023 9.32 (H) 0.61 - 1.24 mg/dL Final         Failed - Cl in normal range and within 180 days    Chloride  Date Value Ref Range Status  11/01/2023 95 (L) 98 - 111 mmol/L Final         Failed - Ca in normal range and within 180 days    Calcium   Date Value Ref Range Status  11/01/2023 8.5 (L) 8.9 - 10.3 mg/dL Final   Calcium , Ion  Date Value Ref Range Status  08/05/2023 1.06 (L) 1.15 - 1.40 mmol/L Final         Passed - Mg Level in normal range and within 360 days    Magnesium   Date Value Ref Range Status  08/09/2023 2.4 1.7 - 2.4 mg/dL Final    Comment:    Performed at [facility], [address] [place], [place] [address]         Passed - ALP in normal range and within 360 days    Alkaline Phosphatase  Date Value Ref Range Status  10/25/2023 81 38 - 126 U/L Final         Passed - Phosphate in normal range and within 180 days    Phosphorus  Date Value Ref Range Status  11/01/2023 3.7 2.5 - 4.6 mg/dL Final         Passed - CO2 in normal range and within 180 days    CO2  Date Value Ref Range Status  11/01/2023 27 22 - 32 mmol/L Final   Bicarbonate  Date Value Ref Range Status  08/05/2023 26.5 20.0 - 28.0 mmol/L Final         Passed - Valid encounter within last 12 months    Recent Outpatient Visits           2 months ago Aspiration pneumonia, unspecified aspiration pneumonia type, unspecified laterality, unspecified part of lung (HCC)   [facility] [place], Jolene T, NP               silver  sulfADIAZINE  (SILVADENE ) 1 % cream 50 g 2    Sig: Apply 1 Application topically daily.     Off-Protocol Failed - 12/09/2023  8:17 AM      Failed - Medication not assigned to a protocol, review manually.      Passed - Valid encounter within last 12 months    Recent Outpatient  Visits           2 months ago Aspiration pneumonia, unspecified aspiration pneumonia type, unspecified laterality, unspecified part of lung (HCC)   [facility] [place], Jolene T, NP               zolpidem  (AMBIEN ) 10 MG tablet 30 tablet 1    Sig: Take 1 tablet (10 mg total) by mouth at bedtime as needed for sleep.     Not Delegated - Psychiatry:  Anxiolytics/Hypnotics Failed - 12/09/2023  8:17 AM      Failed - This refill cannot be delegated      Failed - Urine Drug Screen completed in last 360 days      Passed - Valid encounter within last 6 months  Recent Outpatient Visits           2 months ago Aspiration pneumonia, unspecified aspiration pneumonia type, unspecified laterality, unspecified part of lung (HCC)   [place] [facility] [place], Melanie DASEN, NP

## 2023-12-09 NOTE — Telephone Encounter (Signed)
 Requested medication (s) are due for refill today: routing for review  Requested medication (s) are on the active medication list: no  Last refill:  multiple dates  Future visit scheduled: yes  Notes to clinic:  Unable to refill per protocol, Rx expired. Historical medications, routing for approval.      Requested Prescriptions  Pending Prescriptions Disp Refills   iron  polysaccharides (NIFEREX) 150 MG capsule      Sig: Take 1 capsule (150 mg total) by mouth daily.     Off-Protocol Failed - 12/09/2023  8:18 AM      Failed - Medication not assigned to a protocol, review manually.      Passed - Valid encounter within last 12 months    Recent Outpatient Visits           2 months ago Aspiration pneumonia, unspecified aspiration pneumonia type, unspecified laterality, unspecified part of lung (HCC)   [place] [facility] [place], [place] T, NP               ondansetron  (ZOFRAN ) 4 MG tablet 20 tablet     Sig: Take 1 tablet (4 mg total) by mouth every 6 (six) hours as needed for nausea or vomiting.     Not Delegated - Gastroenterology: Antiemetics - ondansetron  Failed - 12/09/2023  8:18 AM      Failed - This refill cannot be delegated      Passed - AST in normal range and within 360 days    AST  Date Value Ref Range Status  10/25/2023 41 15 - 41 U/L Final         Passed - ALT in normal range and within 360 days    ALT  Date Value Ref Range Status  10/25/2023 29 0 - 44 U/L Final         Passed - Valid encounter within last 6 months    Recent Outpatient Visits           2 months ago Aspiration pneumonia, unspecified aspiration pneumonia type, unspecified laterality, unspecified part of lung (HCC)   [place] [facility] [place], Jolene T, NP               sevelamer  carbonate (RENVELA ) 800 MG tablet      Sig: Take 3 tablets (2,400 mg total) by mouth 3 (three) times daily.     Endocrinology:  Phosphate Binders - sevelamer   carbonate Failed - 12/09/2023  8:18 AM      Failed - Albumin  in normal range and within 360 days    Albumin   Date Value Ref Range Status  11/01/2023 2.8 (L) 3.5 - 5.0 g/dL Final  98/70/7974 4.3 3.9 - 4.9 g/dL Final         Failed - PTH in normal range and within 180 days    No results found for: IOPTH, PTHINTACTFNA, PTH       Failed - Cr in normal range and within 180 days    Creatinine, Ser  Date Value Ref Range Status  11/01/2023 9.32 (H) 0.61 - 1.24 mg/dL Final         Failed - Cl in normal range and within 180 days    Chloride  Date Value Ref Range Status  11/01/2023 95 (L) 98 - 111 mmol/L Final         Failed - Ca in normal range and within 180 days    Calcium   Date Value Ref Range Status  11/01/2023 8.5 (L) 8.9 -  10.3 mg/dL Final   Calcium , Ion  Date Value Ref Range Status  08/05/2023 1.06 (L) 1.15 - 1.40 mmol/L Final         Passed - Mg Level in normal range and within 360 days    Magnesium   Date Value Ref Range Status  08/09/2023 2.4 1.7 - 2.4 mg/dL Final    Comment:    Performed at [facility], [address] [place], [place] [address]         Passed - ALP in normal range and within 360 days    Alkaline Phosphatase  Date Value Ref Range Status  10/25/2023 81 38 - 126 U/L Final         Passed - Phosphate in normal range and within 180 days    Phosphorus  Date Value Ref Range Status  11/01/2023 3.7 2.5 - 4.6 mg/dL Final         Passed - CO2 in normal range and within 180 days    CO2  Date Value Ref Range Status  11/01/2023 27 22 - 32 mmol/L Final   Bicarbonate  Date Value Ref Range Status  08/05/2023 26.5 20.0 - 28.0 mmol/L Final         Passed - Valid encounter within last 12 months    Recent Outpatient Visits           2 months ago Aspiration pneumonia, unspecified aspiration pneumonia type, unspecified laterality, unspecified part of lung (HCC)   [place] [facility] [place], Jolene T, NP                zolpidem  (AMBIEN ) 10 MG tablet 30 tablet 1    Sig: Take 1 tablet (10 mg total) by mouth at bedtime as needed for sleep.     Not Delegated - Psychiatry:  Anxiolytics/Hypnotics Failed - 12/09/2023  8:18 AM      Failed - This refill cannot be delegated      Failed - Urine Drug Screen completed in last 360 days      Passed - Valid encounter within last 6 months    Recent Outpatient Visits           2 months ago Aspiration pneumonia, unspecified aspiration pneumonia type, unspecified laterality, unspecified part of lung (HCC)   [place] [facility] [place], [place] T, NP              Refused Prescriptions Disp Refills   amiodarone  (PACERONE ) 200 MG tablet 180 tablet 4    Sig: Take 1 tablet (200 mg total) by mouth 2 (two) times daily.     Not Delegated - Cardiovascular: Antiarrhythmic Agents - amiodarone  Failed - 12/09/2023  8:18 AM      Failed - This refill cannot be delegated      Failed - Manual Review: Eye exam recommended every 12 months      Failed - TSH in normal range and within 360 days    TSH  Date Value Ref Range Status  05/12/2022 4.090 0.450 - 4.500 uIU/mL Final         Passed - Mg Level in normal range and within 360 days    Magnesium   Date Value Ref Range Status  08/09/2023 2.4 1.7 - 2.4 mg/dL Final    Comment:    Performed at [facility], [address] [place], [place] [address]         Passed - K in normal range and within 180 days  Potassium  Date Value Ref Range Status  11/01/2023 4.4 3.5 - 5.1 mmol/L Final   Potassium [facility] vascular lab)  Date Value Ref Range Status  08/27/2022 5.2 (H) 3.5 - 5.1 mmol/L Final    Comment:    Performed at [facility], [address], [place], [place] [address]         Passed - AST in normal range and within 180 days    AST  Date Value Ref Range Status  10/25/2023 41 15 - 41 U/L Final         Passed - ALT in normal range and within 180 days    ALT  Date Value Ref  Range Status  10/25/2023 29 0 - 44 U/L Final         Passed - Patient had ECG in the last 180 days      Passed - Patient is not pregnant      Passed - Last BP in normal range    BP Readings from Last 1 Encounters:  11/01/23 122/67         Passed - Last Heart Rate in normal range    Pulse Readings from Last 1 Encounters:  11/01/23 (!) 55         Passed - Valid encounter within last 6 months    Recent Outpatient Visits           2 months ago Aspiration pneumonia, unspecified aspiration pneumonia type, unspecified laterality, unspecified part of lung (HCC)   [place] [facility] [place], [place] T, NP              Passed - Patient had chest x-ray within the last 6 months       carvedilol  (COREG ) 6.25 MG tablet 90 tablet 2    Sig: Take 1 tablet (6.25 mg total) by mouth daily.     Cardiovascular: Beta Blockers 3 Failed - 12/09/2023  8:18 AM      Failed - Cr in normal range and within 360 days    Creatinine, Ser  Date Value Ref Range Status  11/01/2023 9.32 (H) 0.61 - 1.24 mg/dL Final         Passed - AST in normal range and within 360 days    AST  Date Value Ref Range Status  10/25/2023 41 15 - 41 U/L Final         Passed - ALT in normal range and within 360 days    ALT  Date Value Ref Range Status  10/25/2023 29 0 - 44 U/L Final         Passed - Last BP in normal range    BP Readings from Last 1 Encounters:  11/01/23 122/67         Passed - Last Heart Rate in normal range    Pulse Readings from Last 1 Encounters:  11/01/23 (!) 55         Passed - Valid encounter within last 6 months    Recent Outpatient Visits           2 months ago Aspiration pneumonia, unspecified aspiration pneumonia type, unspecified laterality, unspecified part of lung (HCC)   [place] [facility] [place], Jolene T, NP               ELIQUIS  2.5 MG TABS tablet 180 tablet 1    Sig: Take 1 tablet (2.5 mg total) by mouth 2 (two) times daily.      Hematology:  Anticoagulants - apixaban  Failed - 12/09/2023  8:18 AM      Failed - PLT in normal range and within 360 days    Platelets  Date Value Ref Range Status  11/01/2023 120 (L) 150 - 400 K/uL Final  01/12/2023 257 150 - 450 x10E3/uL Final         Failed - HGB in normal range and within 360 days    Hemoglobin  Date Value Ref Range Status  11/01/2023 7.4 (L) 13.0 - 17.0 g/dL Final  89/70/7975 83.1 13.0 - 17.7 g/dL Final         Failed - HCT in normal range and within 360 days    HCT  Date Value Ref Range Status  11/01/2023 24.0 (L) 39.0 - 52.0 % Final   Hematocrit  Date Value Ref Range Status  01/12/2023 51.0 37.5 - 51.0 % Final         Failed - Cr in normal range and within 360 days    Creatinine, Ser  Date Value Ref Range Status  11/01/2023 9.32 (H) 0.61 - 1.24 mg/dL Final         Passed - AST in normal range and within 360 days    AST  Date Value Ref Range Status  10/25/2023 41 15 - 41 U/L Final         Passed - ALT in normal range and within 360 days    ALT  Date Value Ref Range Status  10/25/2023 29 0 - 44 U/L Final         Passed - Valid encounter within last 12 months    Recent Outpatient Visits           2 months ago Aspiration pneumonia, unspecified aspiration pneumonia type, unspecified laterality, unspecified part of lung (HCC)   [place] [facility] [place], Jolene T, NP               rosuvastatin  (CRESTOR ) 40 MG tablet 90 tablet 1    Sig: Take 1 tablet (40 mg total) by mouth daily.     Cardiovascular:  Antilipid - Statins 2 Failed - 12/09/2023  8:18 AM      Failed - Cr in normal range and within 360 days    Creatinine, Ser  Date Value Ref Range Status  11/01/2023 9.32 (H) 0.61 - 1.24 mg/dL Final         Failed - Lipid Panel in normal range within the last 12 months    Cholesterol, Total  Date Value Ref Range Status  01/12/2023 179 100 - 199 mg/dL Final   LDL Chol Calc (NIH)  Date Value Ref Range Status   01/12/2023 117 (H) 0 - 99 mg/dL Final   HDL  Date Value Ref Range Status  01/12/2023 43 >39 mg/dL Final   Triglycerides  Date Value Ref Range Status  08/06/2023 77 <150 mg/dL Final    Comment:    Performed at [facility], [address] [place], [place] [address]         Passed - Patient is not pregnant      Passed - Valid encounter within last 12 months    Recent Outpatient Visits           2 months ago Aspiration pneumonia, unspecified aspiration pneumonia type, unspecified laterality, unspecified part of lung (HCC)   [place] [facility] [place], Melanie DASEN, NP               silver   sulfADIAZINE  (SILVADENE ) 1 % cream 50 g 2    Sig: Apply 1 Application topically daily.     Off-Protocol Failed - 12/09/2023  8:18 AM      Failed - Medication not assigned to a protocol, review manually.      Passed - Valid encounter within last 12 months    Recent Outpatient Visits           2 months ago Aspiration pneumonia, unspecified aspiration pneumonia type, unspecified laterality, unspecified part of lung (HCC)   [place] [facility] [place], Melanie DASEN, NP

## 2023-12-10 ENCOUNTER — Other Ambulatory Visit: Payer: Self-pay

## 2023-12-10 ENCOUNTER — Telehealth: Payer: Self-pay

## 2023-12-10 ENCOUNTER — Other Ambulatory Visit (HOSPITAL_COMMUNITY): Payer: Self-pay

## 2023-12-10 ENCOUNTER — Encounter
Admission: EM | Disposition: A | Discharge status: Home / Self Care | Payer: Self-pay | Source: Home / Self Care | Attending: Emergency Medicine

## 2023-12-10 ENCOUNTER — Telehealth (INDEPENDENT_AMBULATORY_CARE_PROVIDER_SITE_OTHER): Payer: Self-pay | Admitting: Vascular Surgery

## 2023-12-10 ENCOUNTER — Emergency Department

## 2023-12-10 ENCOUNTER — Telehealth (INDEPENDENT_AMBULATORY_CARE_PROVIDER_SITE_OTHER): Payer: Self-pay

## 2023-12-10 ENCOUNTER — Emergency Department
Admission: EM | Admit: 2023-12-10 | Discharge: 2023-12-10 | Disposition: A | Discharge status: Home / Self Care | Attending: Emergency Medicine | Admitting: Emergency Medicine

## 2023-12-10 ENCOUNTER — Encounter: Payer: Self-pay | Admitting: Emergency Medicine

## 2023-12-10 DIAGNOSIS — Z992 Dependence on renal dialysis: Secondary | ICD-10-CM | POA: Diagnosis not present

## 2023-12-10 DIAGNOSIS — Z743 Need for continuous supervision: Secondary | ICD-10-CM | POA: Diagnosis not present

## 2023-12-10 DIAGNOSIS — I517 Cardiomegaly: Secondary | ICD-10-CM | POA: Diagnosis not present

## 2023-12-10 DIAGNOSIS — T829XXA Unspecified complication of cardiac and vascular prosthetic device, implant and graft, initial encounter: Secondary | ICD-10-CM

## 2023-12-10 DIAGNOSIS — T8249XA Other complication of vascular dialysis catheter, initial encounter: Secondary | ICD-10-CM | POA: Diagnosis present

## 2023-12-10 DIAGNOSIS — J90 Pleural effusion, not elsewhere classified: Secondary | ICD-10-CM | POA: Diagnosis not present

## 2023-12-10 DIAGNOSIS — R059 Cough, unspecified: Secondary | ICD-10-CM | POA: Diagnosis not present

## 2023-12-10 DIAGNOSIS — Y828 Other medical devices associated with adverse incidents: Secondary | ICD-10-CM | POA: Insufficient documentation

## 2023-12-10 DIAGNOSIS — N186 End stage renal disease: Secondary | ICD-10-CM

## 2023-12-10 DIAGNOSIS — I120 Hypertensive chronic kidney disease with stage 5 chronic kidney disease or end stage renal disease: Secondary | ICD-10-CM | POA: Diagnosis not present

## 2023-12-10 HISTORY — PX: DIALYSIS/PERMA CATHETER INSERTION: CATH118288

## 2023-12-10 LAB — BASIC METABOLIC PANEL WITH GFR
Anion gap: 17 — ABNORMAL HIGH (ref 5–15)
BUN: 41 mg/dL — ABNORMAL HIGH (ref 8–23)
CO2: 27 mmol/L (ref 22–32)
Calcium: 8 mg/dL — ABNORMAL LOW (ref 8.9–10.3)
Chloride: 97 mmol/L — ABNORMAL LOW (ref 98–111)
Creatinine, Ser: 7.67 mg/dL — ABNORMAL HIGH (ref 0.61–1.24)
GFR, Estimated: 7 mL/min — ABNORMAL LOW (ref >60)
Glucose, Bld: 118 mg/dL — ABNORMAL HIGH (ref 70–99)
Potassium: 3.6 mmol/L (ref 3.5–5.1)
Sodium: 141 mmol/L (ref 135–145)

## 2023-12-10 LAB — CBC WITH DIFFERENTIAL/PLATELET
Abs Immature Granulocytes: 0.02 K/uL (ref 0.00–0.07)
Basophils Absolute: 0 K/uL (ref 0.0–0.1)
Basophils Relative: 1 %
Eosinophils Absolute: 0.1 K/uL (ref 0.0–0.5)
Eosinophils Relative: 4 %
HCT: 31 % — ABNORMAL LOW (ref 39.0–52.0)
Hemoglobin: 9.7 g/dL — ABNORMAL LOW (ref 13.0–17.0)
Immature Granulocytes: 1 %
Lymphocytes Relative: 23 %
Lymphs Abs: 0.8 K/uL (ref 0.7–4.0)
MCH: 25.6 pg — ABNORMAL LOW (ref 26.0–34.0)
MCHC: 31.3 g/dL (ref 30.0–36.0)
MCV: 81.8 fL (ref 80.0–100.0)
Monocytes Absolute: 0.3 K/uL (ref 0.1–1.0)
Monocytes Relative: 9 %
Neutro Abs: 2.1 K/uL (ref 1.7–7.7)
Neutrophils Relative %: 62 %
Platelets: 121 K/uL — ABNORMAL LOW (ref 150–400)
RBC: 3.79 MIL/uL — ABNORMAL LOW (ref 4.22–5.81)
RDW: 19.9 % — ABNORMAL HIGH (ref 11.5–15.5)
WBC: 3.4 K/uL — ABNORMAL LOW (ref 4.0–10.5)
nRBC: 0 % (ref 0.0–0.2)

## 2023-12-10 SURGERY — DIALYSIS/PERMA CATHETER INSERTION
Anesthesia: Moderate Sedation

## 2023-12-10 SURGERY — TUNNELLED CATHETER EXCHANGE
Anesthesia: Moderate Sedation

## 2023-12-10 MED ORDER — MIDAZOLAM HCL 5 MG/5ML IJ SOLN
INTRAMUSCULAR | Status: AC
Start: 2023-12-10 — End: 2023-12-10
  Filled 2023-12-10: qty 5

## 2023-12-10 MED ORDER — MIDAZOLAM HCL 2 MG/2ML IJ SOLN
INTRAMUSCULAR | Status: DC | PRN
  Administered 2023-12-10: 2 mg via INTRAVENOUS

## 2023-12-10 MED ORDER — FENTANYL CITRATE (PF) 100 MCG/2ML IJ SOLN
INTRAMUSCULAR | Status: DC | PRN
  Administered 2023-12-10: 50 ug via INTRAVENOUS

## 2023-12-10 MED ORDER — HYDROMORPHONE HCL 1 MG/ML IJ SOLN: 1.0000 mg | Freq: Once | INTRAMUSCULAR | Status: DC | PRN

## 2023-12-10 MED ORDER — ONDANSETRON HCL 4 MG/2ML IJ SOLN: 4.0000 mg | Freq: Four times a day (QID) | INTRAMUSCULAR | Status: DC | PRN

## 2023-12-10 MED ORDER — CEFAZOLIN SODIUM-DEXTROSE 1-4 GM/50ML-% IV SOLN
1.0000 g | INTRAVENOUS | Status: AC
  Administered 2023-12-10: 1 g via INTRAVENOUS

## 2023-12-10 MED ORDER — FENTANYL CITRATE (PF) 100 MCG/2ML IJ SOLN
INTRAMUSCULAR | Status: AC
  Filled 2023-12-10: qty 2

## 2023-12-10 MED ORDER — FAMOTIDINE 20 MG PO TABS: 40.0000 mg | ORAL_TABLET | Freq: Once | ORAL | Status: DC | PRN

## 2023-12-10 MED ORDER — MIDAZOLAM HCL 2 MG/ML PO SYRP: 8.0000 mg | ORAL_SOLUTION | Freq: Once | ORAL | Status: DC | PRN

## 2023-12-10 MED ORDER — HEPARIN (PORCINE) IN NACL 1000-0.9 UT/500ML-% IV SOLN
INTRAVENOUS | Status: DC | PRN
  Administered 2023-12-10: 500 mL

## 2023-12-10 MED ORDER — HEPARIN SODIUM (PORCINE) 10000 UNIT/ML IJ SOLN
INTRAMUSCULAR | Status: AC
  Filled 2023-12-10: qty 1

## 2023-12-10 MED ORDER — HYDROCODONE-ACETAMINOPHEN 5-325 MG PO TABS
1.0000 | ORAL_TABLET | Freq: Once | ORAL | Status: AC
  Administered 2023-12-10: 1 via ORAL
  Filled 2023-12-10: qty 1

## 2023-12-10 MED ORDER — METHYLPREDNISOLONE SODIUM SUCC 125 MG IJ SOLR: 125.0000 mg | Freq: Once | INTRAMUSCULAR | Status: DC | PRN

## 2023-12-10 MED ORDER — HEPARIN SODIUM (PORCINE) 10000 UNIT/ML IJ SOLN
INTRAMUSCULAR | Status: DC | PRN
  Administered 2023-12-10: 10000 [IU]

## 2023-12-10 MED ORDER — CEFAZOLIN SODIUM-DEXTROSE 1-4 GM/50ML-% IV SOLN
INTRAVENOUS | Status: AC
  Filled 2023-12-10: qty 50

## 2023-12-10 MED ORDER — DIPHENHYDRAMINE HCL 50 MG/ML IJ SOLN: 50.0000 mg | Freq: Once | INTRAMUSCULAR | Status: DC | PRN

## 2023-12-10 MED ORDER — SODIUM CHLORIDE 0.9 % IV SOLN: INTRAVENOUS | Status: DC

## 2023-12-10 MED ORDER — LIDOCAINE HCL (PF) 1 % IJ SOLN
INTRAMUSCULAR | Status: DC | PRN
  Administered 2023-12-10: 20 mL via INTRADERMAL

## 2023-12-10 SURGICAL SUPPLY — 7 items
BIOPATCH RED 1 DISK 7.0 (GAUZE/BANDAGES/DRESSINGS) IMPLANT
CATH CANNON HEMO 15FR 32 (HEMODIALYSIS SUPPLIES) IMPLANT
GOWN STRL REUS W/ TWL LRG LVL3 (GOWN DISPOSABLE) ×1 IMPLANT
GUIDEWIRE SUPER STIFF .035X180 (WIRE) IMPLANT
PACK ANGIOGRAPHY (CUSTOM PROCEDURE TRAY) ×1 IMPLANT
SUT MNCRL AB 4-0 PS2 18 (SUTURE) IMPLANT
SUT SILK 0 FSL (SUTURE) IMPLANT

## 2023-12-10 NOTE — OR Nursing (Signed)
 Discussed with ED charge nurse fact that patient has bad cough and keeps desating into 80's more than 90's. He can clear cough but he has not had his respiratory status worked up. He will transfer back to ED 19 and she will arrange for ED md to assess .

## 2023-12-10 NOTE — ED Triage Notes (Signed)
 Patient to ED via [facility] due to dialysis cathter leaking blood. Started today. Unable to get dialysis today due to same. No leaking noted at this time.  Denies any other complaint at this time.

## 2023-12-10 NOTE — ED Notes (Signed)
 Pt back to ED from [facility]. Per [place], RN pt having periods of desatting with them. Pt states that he does wear oxygen sometimes at night. Pt is currently on 2 liters of oxygen. Pt has equal and unlabored respirations. Pt is able to speak in complete sentences and is in NAD.   O2 was turned off to evaluate if patient's oxygen level drops without supplemental O2.

## 2023-12-10 NOTE — Consult Note (Signed)
 MRN : [id number]  Samari Gorby is a 63 y.o. (09/11/1960) male who presents with chief complaint of check access.  History of Present Illness:   Patient presents to [facility] emergency room secondary to a fracture of the catheter.  Dialysis center reports bleeding from the catheter.  Current access is via a catheter which is functioning poorly the flow rates have been less than ideal.  In fact he had already been scheduled for next week for catheter exchanged secondary to the poor catheter function.  There have not been multiple episodes of catheter infection.  The patient denies fever and chills while on dialysis.  No tenderness or drainage at the exit site.  No recent shortening of the patient's walking distance or new symptoms consistent with claudication.  No history of rest pain symptoms. No new ulcers or wounds of the lower extremities have occurred.  The patient denies amaurosis fugax or recent TIA symptoms. There are no recent neurological changes noted. There is no history of DVT, PE or superficial thrombophlebitis. No recent episodes of angina or shortness of breath documented.   Chest x-ray obtained in the emergency room demonstrates the left IJ dialysis catheter with its arterial catheter tip distal to the SVC.  No other abnormalities are noted  No outpatient medications have been marked as taking for the 12/10/23 encounter [facility] Encounter).    Past Medical History:  Diagnosis Date   Anemia in chronic kidney disease    Cellulitis of left lower extremity    Chronic atrial fibrillation (HCC)    Chronic kidney disease    Congestive heart failure (HCC)    Difficult intubation    End-stage renal disease on hemodialysis (HCC)    Hyperlipidemia    Hypertension    Type 2 diabetes mellitus with complication [facility])     Past Surgical History:  Procedure Laterality Date   A/V FISTULAGRAM Left 08/27/2022   Procedure: A/V  Fistulagram;  Surgeon: Marea Selinda RAMAN, MD;  Location: [facility] INVASIVE CV LAB;  Service: Cardiovascular;  Laterality: Left;   A/V FISTULAGRAM Left 08/12/2023   Procedure: A/V Fistulagram;  Surgeon: Marea Selinda RAMAN, MD;  Location: [facility] INVASIVE CV LAB;  Service: Cardiovascular;  Laterality: Left;   AMPUTATION Right 03/13/2022   Procedure: AMPUTATION ABOVE KNEE;  Surgeon: Marea Selinda RAMAN, MD;  Location: [facility] ORS;  Service: General;  Laterality: Right;   APPLICATION OF WOUND VAC Right 03/07/2022   Procedure: APPLICATION OF WOUND VAC;  Surgeon: Tisa Curry LABOR, MD;  Location: [facility] ORS;  Service: Vascular;  Laterality: Right;  GAAC [id number]   CENTRAL LINE INSERTION Left 08/05/2023   Procedure: CENTRAL LINE INSERTION;  Surgeon: Marea Selinda RAMAN, MD;  Location: [facility] ORS;  Service: Vascular;  Laterality: Left;   DIALYSIS/PERMA CATHETER INSERTION Right 08/05/2023   Procedure: DIALYSIS/PERMA CATHETER INSERTION;  Surgeon: Marea Selinda RAMAN, MD;  Location: [facility] ORS;  Service: Vascular;  Laterality: Right;   DIALYSIS/PERMA CATHETER INSERTION N/A 09/06/2023   Procedure: DIALYSIS/PERMA CATHETER INSERTION;  Surgeon: Marea Selinda RAMAN, MD;  Location: [facility] INVASIVE CV LAB;  Service: Cardiovascular;  Laterality: N/A;   DIALYSIS/PERMA CATHETER REMOVAL Right 08/31/2023   Procedure: DIALYSIS/PERMA CATHETER REMOVAL;  Surgeon: Jama Cordella MATSU, MD;  Location: [facility] INVASIVE CV LAB;  Service: Cardiovascular;  Laterality: Right;   GROIN DISSECTION Right 08/05/2023   Procedure: right groin venogram, angioplasty of superior venacava and anomic  vein;  Surgeon: Marea Selinda RAMAN, MD;  Location: [facility] ORS;  Service: Vascular;  Laterality: Right;   LOWER EXTREMITY ANGIOGRAPHY Left 02/15/2023   Procedure: Lower Extremity Angiography;  Surgeon: Marea Selinda RAMAN, MD;  Location: [facility] INVASIVE CV LAB;  Service: Cardiovascular;  Laterality: Left;   REVISON OF ARTERIOVENOUS FISTULA Left 08/05/2023   Procedure: REVISON OF ARTERIOVENOUS FISTULA;  Surgeon: Marea Selinda RAMAN, MD;   Location: [facility] ORS;  Service: Vascular;  Laterality: Left;  JUMP GRAFT   REVISON OF ARTERIOVENOUS FISTULA Left 09/01/2023   Procedure: REVISON OF ARTERIOVENOUS FISTULA; INSERTION OF TEMPORARY HEMODIALYSIS CATHETER IN LEFT GROIN;  Surgeon: Marea Selinda RAMAN, MD;  Location: [facility] ORS;  Service: Vascular;  Laterality: Left;  excision of AV fistula graft   TEE WITHOUT CARDIOVERSION N/A 09/03/2023   Procedure: ECHOCARDIOGRAM, TRANSESOPHAGEAL;  Surgeon: Perla Evalene PARAS, MD;  Location: [facility] ORS;  Service: Cardiovascular;  Laterality: N/A;   WOUND DEBRIDEMENT Left    WOUND DEBRIDEMENT Right 01/16/2022   Procedure: DEBRIDEMENT WOUND;  Surgeon: Jama Cordella MATSU, MD;  Location: [facility] ORS;  Service: Vascular;  Laterality: Right;  Wound vac placement   WOUND DEBRIDEMENT Right 03/07/2022   Procedure: DEBRIDEMENT WOUND RIGHT LOWER EXTREMITY;  Surgeon: Tisa Curry LABOR, MD;  Location: [facility] ORS;  Service: Vascular;  Laterality: Right;    Social History Social History   Tobacco Use   Smoking status: Never   Smokeless tobacco: Never  Vaping Use   Vaping status: Never Used  Substance Use Topics   Alcohol use: Not Currently   Drug use: Never    Family History Family History  Problem Relation Age of Onset   Heart failure Mother    Cirrhosis Father    Hypertension Daughter     Allergies  Allergen Reactions   Ivp Dye [Iodinated Contrast Media] Anaphylaxis   Tramadol  Rash     REVIEW OF SYSTEMS (Negative unless checked)  Constitutional: [] Weight loss  [] Fever  [] Chills Cardiac: [] Chest pain   [] Chest pressure   [] Palpitations   [] Shortness of breath when laying flat   [] Shortness of breath with exertion. Vascular:  [] Pain in legs with walking   [] Pain in legs at rest  [] History of DVT   [] Phlebitis   [] Swelling in legs   [] Varicose veins   [] Non-healing ulcers Pulmonary:   [] Uses home oxygen   [] Productive cough   [] Hemoptysis   [] Wheeze  [] COPD   [] Asthma Neurologic:  [] Dizziness   [] Seizures    [] History of stroke   [] History of TIA  [] Aphasia   [] Vissual changes   [] Weakness or numbness in arm   [] Weakness or numbness in leg Musculoskeletal:   [] Joint swelling   [] Joint pain   [] Low back pain Hematologic:  [] Easy bruising  [] Easy bleeding   [] Hypercoagulable state   [] Anemic Gastrointestinal:  [] Diarrhea   [] Vomiting  [] Gastroesophageal reflux/heartburn   [] Difficulty swallowing. Genitourinary:  [x] Chronic kidney disease   [] Difficult urination  [] Frequent urination   [] Blood in urine Skin:  [] Rashes   [] Ulcers  Psychological:  [] History of anxiety   []  History of major depression.  Physical Examination  Vitals:   12/10/23 1058 12/10/23 1110 12/10/23 1417 12/10/23 1529  BP: (!) 157/92  (!) 164/90 (!) 169/103  Pulse: 71  71   Resp: 18  20   Temp: 99.6 F (37.6 C)  98.1 F (36.7 C) 97.7 F (36.5 C)  TempSrc: Oral  Oral Oral  SpO2: 95%  98%   Weight:  57.6 kg    Height:  6' 3 (1.905 m)     Body mass index is 15.87 kg/m. Gen: WD/WN, NAD Head: [place]/AT, No temporalis wasting.  Ear/Nose/Throat: Hearing grossly intact, nares w/o erythema or drainage Eyes: PER, EOMI, sclera nonicteric.  Neck: Supple, no gross masses or lesions.  No JVD.  Pulmonary:  Good air movement, no audible wheezing, no use of accessory muscles.  Cardiac: RRR, precordium non-hyperdynamic. Vascular:   Left IJ catheter no overt bleeding at this time Vessel Right Left  Radial Palpable Palpable  Brachial Palpable Palpable  Gastrointestinal: soft, non-distended. No guarding/no peritoneal signs.  Musculoskeletal: M/S 5/5 throughout.  No deformity.  Neurologic: CN 2-12 intact. Pain and light touch intact in extremities.  Symmetrical.  Speech is fluent. Motor exam as listed above. Psychiatric: Judgment intact, Mood & affect appropriate for pt's clinical situation. Dermatologic: No rashes or ulcers noted.  No changes consistent with cellulitis.   CBC Lab Results  Component Value Date   WBC 3.4 (L)  12/10/2023   HGB 9.7 (L) 12/10/2023   HCT 31.0 (L) 12/10/2023   MCV 81.8 12/10/2023   PLT 121 (L) 12/10/2023    BMET    Component Value Date/Time   NA 141 12/10/2023 1241   NA 136 04/14/2023 1610   K 3.6 12/10/2023 1241   CL 97 (L) 12/10/2023 1241   CO2 27 12/10/2023 1241   GLUCOSE 118 (H) 12/10/2023 1241   BUN 41 (H) 12/10/2023 1241   BUN 18 04/14/2023 1610   CREATININE 7.67 (H) 12/10/2023 1241   CALCIUM  8.0 (L) 12/10/2023 1241   GFRNONAA 7 (L) 12/10/2023 1241   Estimated Creatinine Clearance: 8.1 mL/min (A) (by C-G formula based on SCr of 7.67 mg/dL (H)).  COAG Lab Results  Component Value Date   INR 1.3 (H) 08/05/2023   INR 1.2 03/05/2022    Radiology DG Chest 2 View Result Date: 12/10/2023 EXAM: 2 VIEW(S) XRAY OF THE CHEST 12/10/2023 01:51:27 PM COMPARISON: 10/25/2023 CLINICAL HISTORY: eval permacath location. Patient to ED via [facility] due to dialysis cathter leaking blood. Started today. Unable to get dialysis today due to same. No leaking noted at this time. Denies any other complaint at this time. FINDINGS: LINES, TUBES AND DEVICES: There is a left IJ dialysis catheter. The proximal lumen terminates over the SVC. The lumen terminates over the superior cavoatrial junction. These appear unchanged when compared with 10/25/2023. LUNGS AND PLEURA: No focal pulmonary opacity. No pulmonary edema. No pleural effusion. No pneumothorax. HEART AND MEDIASTINUM: Cardiomegaly. BONES AND SOFT TISSUES: No acute osseous abnormality. IMPRESSION: 1. Left IJ dialysis catheter positioned as described above. Unchanged from 10/25/2023. 2. Cardiomegaly. 3. Resolved pleural effusions. Electronically signed by: Waddell Calk MD 12/10/2023 02:28 PM EDT RP Workstation: [id number]     Assessment/Plan ESRD on dialysis [facility]) Patient has a fractured catheter which already was poorly functioning.  The fracture places him at increased risk for air embolism bleeding and infection.  He will undergo emergent  replacement.  Risks and benefits have been reviewed all questions are answered patient agrees to proceed  Hypertension blood pressure control important in reducing the progression of atherosclerotic disease. On appropriate oral medications.     Type 2 diabetes mellitus with ESRD (end-stage renal disease) (HCC) blood glucose control important in reducing the progression of atherosclerotic disease. Also, involved in wound healing. On appropriate medications.     Hyperlipidemia associated with type 2 diabetes mellitus (HCC) lipid control important in reducing the progression of atherosclerotic disease. Continue statin therapy     S/P  AKA (above knee amputation), right (HCC) Well healed.       Cordella Shawl, MD  12/10/2023 3:34 PM

## 2023-12-10 NOTE — Interval H&P Note (Signed)
 History and Physical Interval Note:  12/10/2023 3:41 PM  Adam Howell  has presented today for surgery, with the diagnosis of ESRD.  The various methods of treatment have been discussed with the patient and family. After consideration of risks, benefits and other options for treatment, the patient has consented to  Procedure(s): DIALYSIS/PERMA CATHETER INSERTION (N/A) as a surgical intervention.  The patient's history has been reviewed, patient examined, no change in status, stable for surgery.  I have reviewed the patient's chart and labs.  Questions were answered to the patient's satisfaction.     Cordella Shawl

## 2023-12-10 NOTE — ED Provider Notes (Signed)
 [facility] Provider Note    Event Date/Time   First MD Initiated Contact with Patient 12/10/23 1130     (approximate)   History   Vascular Access Problem   HPI  Adam Howell is a 63 y.o. male  who was sent from dialysis center today because they noticed some blood leaking from his dialysis catheter. The patient last had dialysis on Wednesday. Did not receive dialysis today. Per chart review vascular surgery was contacted and has him scheduled for a catheter exchange in 6 days. Patient denies any recent illness. Denies shortness of breath. Says he still makes urine.       Physical Exam   Triage Vital Signs: ED Triage Vitals  Encounter Vitals Group     BP 12/10/23 1058 (!) 157/92     Girls Systolic BP Percentile --      Girls Diastolic BP Percentile --      Boys Systolic BP Percentile --      Boys Diastolic BP Percentile --      Pulse Rate 12/10/23 1058 71     Resp 12/10/23 1058 18     Temp 12/10/23 1058 99.6 F (37.6 C)     Temp Source 12/10/23 1058 Oral     SpO2 12/10/23 1058 95 %     Weight 12/10/23 1110 127 lb (57.6 kg)     Height 12/10/23 1110 6' 3 (1.905 m)     Head Circumference --      Peak Flow --      Pain Score 12/10/23 1110 0     Pain Loc --      Pain Education --      Exclude from Growth Chart --     Most recent vital signs: Vitals:   12/10/23 1058  BP: (!) 157/92  Pulse: 71  Resp: 18  Temp: 99.6 F (37.6 C)  SpO2: 95%   General: Awake, alert, oriented. CV:  Good peripheral perfusion.  Resp:  Normal effort.  Abd:  No distention.  Other:  Dialysis catheter to left chest, no blood noticed in tubing.   ED Results / Procedures / Treatments   Labs (all labs ordered are listed, but only abnormal results are displayed) Labs Reviewed  CBC WITH DIFFERENTIAL/PLATELET - Abnormal; Notable for the following components:      Result Value   WBC 3.4 (*)    RBC 3.79 (*)    Hemoglobin 9.7 (*)    HCT 31.0 (*)    MCH  25.6 (*)    RDW 19.9 (*)    Platelets 121 (*)    All other components within normal limits  BASIC METABOLIC PANEL WITH GFR - Abnormal; Notable for the following components:   Chloride 97 (*)    Glucose, Bld 118 (*)    BUN 41 (*)    Creatinine, Ser 7.67 (*)    Calcium  8.0 (*)    GFR, Estimated 7 (*)    Anion gap 17 (*)    All other components within normal limits     EKG  None   RADIOLOGY I independently interpreted and visualized the CXR. My interpretation: catheter appears to be well positioned Radiology interpretation:  IMPRESSION:  1. Left IJ dialysis catheter positioned as described above. Unchanged from  10/25/2023.  2. Cardiomegaly.  3. Resolved pleural effusions.     PROCEDURES:  Critical Care performed: No    MEDICATIONS ORDERED IN ED: Medications - No data to display   IMPRESSION /  MDM / ASSESSMENT AND PLAN / ED COURSE  I reviewed the triage vital signs and the nursing notes.                              Differential diagnosis includes, but is not limited to, dialysis catheter problem, hyperkalemia  Patient's presentation is most consistent with acute presentation with potential threat to life or bodily function.   Patient presented to the emergency department today because of concern for dialysis catheter leaking. On exam does have left chest catheter in place, no obvious blood in tube. Blood work without hyperkalemia. Discussed with Dr. Jama with vascular surgery who will take patient for catheter replacement.  During catheter exchange it was reported that the patient desated and he was placed on oxygen. He says he uses oxygen as needed at nights. Patient was observed on room air in the ER without any significant desaturations. Discussed with patient that he should call his dialysis center tomorrow for dialysis.       FINAL CLINICAL IMPRESSION(S) / ED DIAGNOSES   Final diagnoses:  Complication of vascular dialysis catheter, unspecified  complication, initial encounter        Rx / DC Orders   ED Discharge Orders     None        Note:  This document was prepared using Dragon voice recognition software and may include unintentional dictation errors.    Floy Roberts, MD 12/11/23 [id number]

## 2023-12-10 NOTE — ED Notes (Signed)
 This RN tried to draw pts blood but was unsuccessful. Phlebotomist in pts room at this moment.

## 2023-12-10 NOTE — ED Notes (Signed)
 Pt discharged to home via ambulance.  Instructions reviewed understanding verbalized.  No questions at this time.

## 2023-12-10 NOTE — OR Nursing (Signed)
 Daughter called regarding picking him up post procedure. She is going to look for transport

## 2023-12-10 NOTE — H&P (View-Only) (Signed)
 MRN : [id number]  Adam Howell is a 63 y.o. (09/11/1960) male who presents with chief complaint of check access.  History of Present Illness:   Patient presents to [facility] emergency room secondary to a fracture of the catheter.  Dialysis center reports bleeding from the catheter.  Current access is via a catheter which is functioning poorly the flow rates have been less than ideal.  In fact he had already been scheduled for next week for catheter exchanged secondary to the poor catheter function.  There have not been multiple episodes of catheter infection.  The patient denies fever and chills while on dialysis.  No tenderness or drainage at the exit site.  No recent shortening of the patient's walking distance or new symptoms consistent with claudication.  No history of rest pain symptoms. No new ulcers or wounds of the lower extremities have occurred.  The patient denies amaurosis fugax or recent TIA symptoms. There are no recent neurological changes noted. There is no history of DVT, PE or superficial thrombophlebitis. No recent episodes of angina or shortness of breath documented.   Chest x-ray obtained in the emergency room demonstrates the left IJ dialysis catheter with its arterial catheter tip distal to the SVC.  No other abnormalities are noted  No outpatient medications have been marked as taking for the 12/10/23 encounter [facility] Encounter).    Past Medical History:  Diagnosis Date   Anemia in chronic kidney disease    Cellulitis of left lower extremity    Chronic atrial fibrillation (HCC)    Chronic kidney disease    Congestive heart failure (HCC)    Difficult intubation    End-stage renal disease on hemodialysis (HCC)    Hyperlipidemia    Hypertension    Type 2 diabetes mellitus with complication [facility])     Past Surgical History:  Procedure Laterality Date   A/V FISTULAGRAM Left 08/27/2022   Procedure: A/V  Fistulagram;  Surgeon: Marea Selinda RAMAN, MD;  Location: [facility] INVASIVE CV LAB;  Service: Cardiovascular;  Laterality: Left;   A/V FISTULAGRAM Left 08/12/2023   Procedure: A/V Fistulagram;  Surgeon: Marea Selinda RAMAN, MD;  Location: [facility] INVASIVE CV LAB;  Service: Cardiovascular;  Laterality: Left;   AMPUTATION Right 03/13/2022   Procedure: AMPUTATION ABOVE KNEE;  Surgeon: Marea Selinda RAMAN, MD;  Location: [facility] ORS;  Service: General;  Laterality: Right;   APPLICATION OF WOUND VAC Right 03/07/2022   Procedure: APPLICATION OF WOUND VAC;  Surgeon: Tisa Curry LABOR, MD;  Location: [facility] ORS;  Service: Vascular;  Laterality: Right;  GAAC [id number]   CENTRAL LINE INSERTION Left 08/05/2023   Procedure: CENTRAL LINE INSERTION;  Surgeon: Marea Selinda RAMAN, MD;  Location: [facility] ORS;  Service: Vascular;  Laterality: Left;   DIALYSIS/PERMA CATHETER INSERTION Right 08/05/2023   Procedure: DIALYSIS/PERMA CATHETER INSERTION;  Surgeon: Marea Selinda RAMAN, MD;  Location: [facility] ORS;  Service: Vascular;  Laterality: Right;   DIALYSIS/PERMA CATHETER INSERTION N/A 09/06/2023   Procedure: DIALYSIS/PERMA CATHETER INSERTION;  Surgeon: Marea Selinda RAMAN, MD;  Location: [facility] INVASIVE CV LAB;  Service: Cardiovascular;  Laterality: N/A;   DIALYSIS/PERMA CATHETER REMOVAL Right 08/31/2023   Procedure: DIALYSIS/PERMA CATHETER REMOVAL;  Surgeon: Jama Cordella MATSU, MD;  Location: [facility] INVASIVE CV LAB;  Service: Cardiovascular;  Laterality: Right;   GROIN DISSECTION Right 08/05/2023   Procedure: right groin venogram, angioplasty of superior venacava and anomic  vein;  Surgeon: Marea Selinda RAMAN, MD;  Location: [facility] ORS;  Service: Vascular;  Laterality: Right;   LOWER EXTREMITY ANGIOGRAPHY Left 02/15/2023   Procedure: Lower Extremity Angiography;  Surgeon: Marea Selinda RAMAN, MD;  Location: [facility] INVASIVE CV LAB;  Service: Cardiovascular;  Laterality: Left;   REVISON OF ARTERIOVENOUS FISTULA Left 08/05/2023   Procedure: REVISON OF ARTERIOVENOUS FISTULA;  Surgeon: Marea Selinda RAMAN, MD;   Location: [facility] ORS;  Service: Vascular;  Laterality: Left;  JUMP GRAFT   REVISON OF ARTERIOVENOUS FISTULA Left 09/01/2023   Procedure: REVISON OF ARTERIOVENOUS FISTULA; INSERTION OF TEMPORARY HEMODIALYSIS CATHETER IN LEFT GROIN;  Surgeon: Marea Selinda RAMAN, MD;  Location: [facility] ORS;  Service: Vascular;  Laterality: Left;  excision of AV fistula graft   TEE WITHOUT CARDIOVERSION N/A 09/03/2023   Procedure: ECHOCARDIOGRAM, TRANSESOPHAGEAL;  Surgeon: Perla Evalene PARAS, MD;  Location: [facility] ORS;  Service: Cardiovascular;  Laterality: N/A;   WOUND DEBRIDEMENT Left    WOUND DEBRIDEMENT Right 01/16/2022   Procedure: DEBRIDEMENT WOUND;  Surgeon: Jama Cordella MATSU, MD;  Location: [facility] ORS;  Service: Vascular;  Laterality: Right;  Wound vac placement   WOUND DEBRIDEMENT Right 03/07/2022   Procedure: DEBRIDEMENT WOUND RIGHT LOWER EXTREMITY;  Surgeon: Tisa Curry LABOR, MD;  Location: [facility] ORS;  Service: Vascular;  Laterality: Right;    Social History Social History   Tobacco Use   Smoking status: Never   Smokeless tobacco: Never  Vaping Use   Vaping status: Never Used  Substance Use Topics   Alcohol use: Not Currently   Drug use: Never    Family History Family History  Problem Relation Age of Onset   Heart failure Mother    Cirrhosis Father    Hypertension Daughter     Allergies  Allergen Reactions   Ivp Dye [Iodinated Contrast Media] Anaphylaxis   Tramadol  Rash     REVIEW OF SYSTEMS (Negative unless checked)  Constitutional: [] Weight loss  [] Fever  [] Chills Cardiac: [] Chest pain   [] Chest pressure   [] Palpitations   [] Shortness of breath when laying flat   [] Shortness of breath with exertion. Vascular:  [] Pain in legs with walking   [] Pain in legs at rest  [] History of DVT   [] Phlebitis   [] Swelling in legs   [] Varicose veins   [] Non-healing ulcers Pulmonary:   [] Uses home oxygen   [] Productive cough   [] Hemoptysis   [] Wheeze  [] COPD   [] Asthma Neurologic:  [] Dizziness   [] Seizures    [] History of stroke   [] History of TIA  [] Aphasia   [] Vissual changes   [] Weakness or numbness in arm   [] Weakness or numbness in leg Musculoskeletal:   [] Joint swelling   [] Joint pain   [] Low back pain Hematologic:  [] Easy bruising  [] Easy bleeding   [] Hypercoagulable state   [] Anemic Gastrointestinal:  [] Diarrhea   [] Vomiting  [] Gastroesophageal reflux/heartburn   [] Difficulty swallowing. Genitourinary:  [x] Chronic kidney disease   [] Difficult urination  [] Frequent urination   [] Blood in urine Skin:  [] Rashes   [] Ulcers  Psychological:  [] History of anxiety   []  History of major depression.  Physical Examination  Vitals:   12/10/23 1058 12/10/23 1110 12/10/23 1417 12/10/23 1529  BP: (!) 157/92  (!) 164/90 (!) 169/103  Pulse: 71  71   Resp: 18  20   Temp: 99.6 F (37.6 C)  98.1 F (36.7 C) 97.7 F (36.5 C)  TempSrc: Oral  Oral Oral  SpO2: 95%  98%   Weight:  57.6 kg    Height:  6' 3 (1.905 m)     Body mass index is 15.87 kg/m. Gen: WD/WN, NAD Head: [place]/AT, No temporalis wasting.  Ear/Nose/Throat: Hearing grossly intact, nares w/o erythema or drainage Eyes: PER, EOMI, sclera nonicteric.  Neck: Supple, no gross masses or lesions.  No JVD.  Pulmonary:  Good air movement, no audible wheezing, no use of accessory muscles.  Cardiac: RRR, precordium non-hyperdynamic. Vascular:   Left IJ catheter no overt bleeding at this time Vessel Right Left  Radial Palpable Palpable  Brachial Palpable Palpable  Gastrointestinal: soft, non-distended. No guarding/no peritoneal signs.  Musculoskeletal: M/S 5/5 throughout.  No deformity.  Neurologic: CN 2-12 intact. Pain and light touch intact in extremities.  Symmetrical.  Speech is fluent. Motor exam as listed above. Psychiatric: Judgment intact, Mood & affect appropriate for pt's clinical situation. Dermatologic: No rashes or ulcers noted.  No changes consistent with cellulitis.   CBC Lab Results  Component Value Date   WBC 3.4 (L)  12/10/2023   HGB 9.7 (L) 12/10/2023   HCT 31.0 (L) 12/10/2023   MCV 81.8 12/10/2023   PLT 121 (L) 12/10/2023    BMET    Component Value Date/Time   NA 141 12/10/2023 1241   NA 136 04/14/2023 1610   K 3.6 12/10/2023 1241   CL 97 (L) 12/10/2023 1241   CO2 27 12/10/2023 1241   GLUCOSE 118 (H) 12/10/2023 1241   BUN 41 (H) 12/10/2023 1241   BUN 18 04/14/2023 1610   CREATININE 7.67 (H) 12/10/2023 1241   CALCIUM  8.0 (L) 12/10/2023 1241   GFRNONAA 7 (L) 12/10/2023 1241   Estimated Creatinine Clearance: 8.1 mL/min (A) (by C-G formula based on SCr of 7.67 mg/dL (H)).  COAG Lab Results  Component Value Date   INR 1.3 (H) 08/05/2023   INR 1.2 03/05/2022    Radiology DG Chest 2 View Result Date: 12/10/2023 EXAM: 2 VIEW(S) XRAY OF THE CHEST 12/10/2023 01:51:27 PM COMPARISON: 10/25/2023 CLINICAL HISTORY: eval permacath location. Patient to ED via [facility] due to dialysis cathter leaking blood. Started today. Unable to get dialysis today due to same. No leaking noted at this time. Denies any other complaint at this time. FINDINGS: LINES, TUBES AND DEVICES: There is a left IJ dialysis catheter. The proximal lumen terminates over the SVC. The lumen terminates over the superior cavoatrial junction. These appear unchanged when compared with 10/25/2023. LUNGS AND PLEURA: No focal pulmonary opacity. No pulmonary edema. No pleural effusion. No pneumothorax. HEART AND MEDIASTINUM: Cardiomegaly. BONES AND SOFT TISSUES: No acute osseous abnormality. IMPRESSION: 1. Left IJ dialysis catheter positioned as described above. Unchanged from 10/25/2023. 2. Cardiomegaly. 3. Resolved pleural effusions. Electronically signed by: Waddell Calk MD 12/10/2023 02:28 PM EDT RP Workstation: [id number]     Assessment/Plan ESRD on dialysis [facility]) Patient has a fractured catheter which already was poorly functioning.  The fracture places him at increased risk for air embolism bleeding and infection.  He will undergo emergent  replacement.  Risks and benefits have been reviewed all questions are answered patient agrees to proceed  Hypertension blood pressure control important in reducing the progression of atherosclerotic disease. On appropriate oral medications.     Type 2 diabetes mellitus with ESRD (end-stage renal disease) (HCC) blood glucose control important in reducing the progression of atherosclerotic disease. Also, involved in wound healing. On appropriate medications.     Hyperlipidemia associated with type 2 diabetes mellitus (HCC) lipid control important in reducing the progression of atherosclerotic disease. Continue statin therapy     S/P  AKA (above knee amputation), right (HCC) Well healed.       Cordella Shawl, MD  12/10/2023 3:34 PM

## 2023-12-10 NOTE — Discharge Instructions (Signed)
 Please call your dialysis center tomorrow morning so they can dialysis you tomorrow. Please seek medical attention for any chest pain, shortness of breath, fevers or any other new or concerning symptoms.

## 2023-12-10 NOTE — Op Note (Signed)
 OPERATIVE NOTE   PROCEDURE: Insertion of tunneled dialysis catheter left IJ approach same venous access.  PRE-OPERATIVE DIAGNOSIS: Nonfunction of existing tunneled dialysis catheter, and stage renal disease requiring hemodialysis   POST-OPERATIVE DIAGNOSIS: Same SURGEON: Cordella Shawl  ANESTHESIA: Conscious sedation was administered under my direct supervision by the interventional radiology RN.  IV Versed  plus fentanyl  were utilized. Continuous ECG, pulse oximetry and blood pressure was monitored throughout the entire procedure.  Conscious sedation was for a total of 26 minutes.  ESTIMATED BLOOD LOSS: Minimal cc  CONTRAST USED:  None  FLUOROSCOPY TIME: 0.3 minutes  INDICATIONS:   Adam Howell is a 63 y.o.y.o. male who presents with poor flow and nonfunction of the tunneled dialysis catheter.  Adequate dialysis has not been possible.  DESCRIPTION: After obtaining full informed written consent, the patient was positioned supine. The left neck and chest wall was prepped and draped in a sterile fashion. The cuff is localized and using blunt and sharp dissection it is freed from the surrounding adhesions.  The existing catheter is then transected proximal to the cuff.  The guidewire is advanced without difficulty under fluoroscopy.  Dilators are passed over the wire as needed and the tunneled dialysis catheter is fed into the central venous system without difficulty.  Under fluoroscopy the catheter tip positioned at the atrial caval junction.  Both lumens aspirate and flush easily. After verification of smooth contour with proper tip position under fluoroscopy the catheter is packed with 5000 units of heparin  per lumen.  Catheter secured to the skin of the left chest wall with 0 silk. A sterile dressing is applied with a Biopatch.  COMPLICATIONS: None  CONDITION: Good  Cordella Shawl [facility] Office:  [phone]   12/10/2023,4:23 PM

## 2023-12-10 NOTE — ED Notes (Signed)
 Fall risk bundle placed on pt. Bed alarm on, grip socks and shoes on pts one leg, and fall risk bracelet on.

## 2023-12-10 NOTE — Telephone Encounter (Signed)
 Barnie Daring from [facility] left a voicemail regarding she send an order for catheter exchange, patient is having some bleeding. Patient last had his dialysis on Wednesday.  Barnie can be reach at [phone].

## 2023-12-10 NOTE — ED Notes (Signed)
 Called [facility] spoke to [place]  for back home [address]  [place]  after 10pm

## 2023-12-10 NOTE — ED Notes (Signed)
 No leaking noted from catheter at this time. Dressing is dry at this time.

## 2023-12-10 NOTE — ED Triage Notes (Signed)
 First nurse note: pt to ED [facility] from home for issues with dialysis catheter issue. Missed dialysis today.

## 2023-12-10 NOTE — Telephone Encounter (Signed)
 I attempted to contact [facility] to schedule the patient for a permcath exchange. I was unable to speak to or leave a message. I called twice. I then made the appt and faxed to [facility] as requested. Patient scheduled on 12/16/23 with a 11:00 am arrival time to the [facility]. Pre-procedure instructions will be faxed to attn: [facility] and [facility].

## 2023-12-10 NOTE — Telephone Encounter (Signed)
 Pharmacy Patient Advocate Encounter   Received notification from Onbase that prior authorization for Sevelamer  Carbonate 800MG  tablets  is required/requested.   Insurance verification completed.   The patient is insured through [employer] .   Per test claim: Not covered under Part D but  may be covered under Part B. Spoke with Pharmacist at [facility] and they need a new RX with the diagnosis code and a note that says bill to Part B. Thanks!

## 2023-12-12 ENCOUNTER — Other Ambulatory Visit: Payer: Self-pay | Admitting: Nurse Practitioner

## 2023-12-12 MED ORDER — SEVELAMER CARBONATE 800 MG PO TABS: 2400.0000 mg | ORAL_TABLET | Freq: Three times a day (TID) | ORAL | 2 refills | Status: AC

## 2023-12-13 ENCOUNTER — Encounter: Payer: Self-pay | Admitting: Vascular Surgery

## 2023-12-13 DIAGNOSIS — N2581 Secondary hyperparathyroidism of renal origin: Secondary | ICD-10-CM | POA: Diagnosis not present

## 2023-12-13 DIAGNOSIS — Z992 Dependence on renal dialysis: Secondary | ICD-10-CM | POA: Diagnosis not present

## 2023-12-13 DIAGNOSIS — D689 Coagulation defect, unspecified: Secondary | ICD-10-CM | POA: Diagnosis not present

## 2023-12-13 DIAGNOSIS — R52 Pain, unspecified: Secondary | ICD-10-CM | POA: Diagnosis not present

## 2023-12-13 DIAGNOSIS — N186 End stage renal disease: Secondary | ICD-10-CM | POA: Diagnosis not present

## 2023-12-13 DIAGNOSIS — E1129 Type 2 diabetes mellitus with other diabetic kidney complication: Secondary | ICD-10-CM | POA: Diagnosis not present

## 2023-12-13 NOTE — Telephone Encounter (Signed)
 Patient has been scheduled

## 2023-12-14 ENCOUNTER — Telehealth: Payer: Self-pay | Admitting: Nurse Practitioner

## 2023-12-14 ENCOUNTER — Other Ambulatory Visit: Payer: Self-pay | Admitting: Nurse Practitioner

## 2023-12-14 DIAGNOSIS — N186 End stage renal disease: Secondary | ICD-10-CM | POA: Diagnosis not present

## 2023-12-14 DIAGNOSIS — Z992 Dependence on renal dialysis: Secondary | ICD-10-CM | POA: Diagnosis not present

## 2023-12-14 DIAGNOSIS — I120 Hypertensive chronic kidney disease with stage 5 chronic kidney disease or end stage renal disease: Secondary | ICD-10-CM | POA: Diagnosis not present

## 2023-12-14 NOTE — Telephone Encounter (Unsigned)
 Copied from CRM [id number]. Topic: Clinical - Medication Refill >> Dec 14, 2023  9:46 AM Vanessa G wrote: Medication: zolpidem  (AMBIEN ) 10 MG tablet  Has the patient contacted their pharmacy? No (Agent: If no, request that the patient contact the pharmacy for the refill. If patient does not wish to contact the pharmacy document the reason why and proceed with request.) (Agent: If yes, when and what did the pharmacy advise?)  This is the patient's preferred pharmacy:  [facility] [address] ([address]), [place] - [address] [place] ([place]) [place] [address] Phone: [phone] Fax: [phone]  [facility] [address] [place] - [address] [place] [address] Phone: [phone] Fax: [phone]   Is this the correct pharmacy for this prescription? Yes If no, delete pharmacy and type the correct one.   Has the prescription been filled recently? No  Is the patient out of the medication? Yes  Has the patient been seen for an appointment in the last year OR does the patient have an upcoming appointment? Yes  Can we respond through MyChart? No  Agent: Please be advised that Rx refills may take up to 3 business days. We ask that you follow-up with your pharmacy.

## 2023-12-14 NOTE — Telephone Encounter (Signed)
 Copied from CRM [id number]. Topic: Referral - Question >> Dec 14, 2023  9:52 AM Shanda MATSU wrote: Reason for CRM: Patient called in req info for pain management provider he was referred to.

## 2023-12-14 NOTE — Telephone Encounter (Signed)
 Attempted to reach patient however unable to leave a message. Please review the below form the provider, medication is for pain. If he does in fact need more often then what he was prescribed please advise referral to be placed for [place] pain management provider as no local providers will offer oral pain medications.

## 2023-12-14 NOTE — Telephone Encounter (Signed)
 I do not see any referrals. Please review.

## 2023-12-15 DIAGNOSIS — E8770 Fluid overload, unspecified: Secondary | ICD-10-CM | POA: Diagnosis not present

## 2023-12-15 DIAGNOSIS — L299 Pruritus, unspecified: Secondary | ICD-10-CM | POA: Diagnosis not present

## 2023-12-15 DIAGNOSIS — R52 Pain, unspecified: Secondary | ICD-10-CM | POA: Diagnosis not present

## 2023-12-15 DIAGNOSIS — N2581 Secondary hyperparathyroidism of renal origin: Secondary | ICD-10-CM | POA: Diagnosis not present

## 2023-12-15 DIAGNOSIS — D689 Coagulation defect, unspecified: Secondary | ICD-10-CM | POA: Diagnosis not present

## 2023-12-15 DIAGNOSIS — N186 End stage renal disease: Secondary | ICD-10-CM | POA: Diagnosis not present

## 2023-12-15 DIAGNOSIS — Z992 Dependence on renal dialysis: Secondary | ICD-10-CM | POA: Diagnosis not present

## 2023-12-15 NOTE — Telephone Encounter (Signed)
 Requested medication (s) are due for refill today: yes  Requested medication (s) are on the active medication list: yes  Last refill:  12/09/23  Future visit scheduled: no  Notes to clinic:  Unable to refill per protocol, cannot delegate.      Requested Prescriptions  Pending Prescriptions Disp Refills   zolpidem  (AMBIEN ) 10 MG tablet 30 tablet 1    Sig: Take 1 tablet (10 mg total) by mouth at bedtime as needed for sleep.     Not Delegated - Psychiatry:  Anxiolytics/Hypnotics Failed - 12/15/2023  4:14 PM      Failed - This refill cannot be delegated      Failed - Urine Drug Screen completed in last 360 days      Passed - Valid encounter within last 6 months    Recent Outpatient Visits           3 months ago Aspiration pneumonia, unspecified aspiration pneumonia type, unspecified laterality, unspecified part of lung (HCC)   [place] [facility] Earle, Melanie DASEN, NP

## 2023-12-16 ENCOUNTER — Ambulatory Visit: Admission: RE | Admit: 2023-12-16 | Source: Home / Self Care | Admitting: Vascular Surgery

## 2023-12-16 ENCOUNTER — Encounter: Admission: RE | Payer: Self-pay | Source: Home / Self Care

## 2023-12-16 SURGERY — DIALYSIS/PERMA CATHETER INSERTION
Anesthesia: Moderate Sedation

## 2023-12-16 MED ORDER — ZOLPIDEM TARTRATE 10 MG PO TABS: 10.0000 mg | ORAL_TABLET | Freq: Every evening | ORAL | 1 refills | Status: DC | PRN

## 2023-12-17 DIAGNOSIS — Z992 Dependence on renal dialysis: Secondary | ICD-10-CM | POA: Diagnosis not present

## 2023-12-17 DIAGNOSIS — R52 Pain, unspecified: Secondary | ICD-10-CM | POA: Diagnosis not present

## 2023-12-17 DIAGNOSIS — N2581 Secondary hyperparathyroidism of renal origin: Secondary | ICD-10-CM | POA: Diagnosis not present

## 2023-12-17 DIAGNOSIS — E8770 Fluid overload, unspecified: Secondary | ICD-10-CM | POA: Diagnosis not present

## 2023-12-17 DIAGNOSIS — N186 End stage renal disease: Secondary | ICD-10-CM | POA: Diagnosis not present

## 2023-12-17 DIAGNOSIS — L299 Pruritus, unspecified: Secondary | ICD-10-CM | POA: Diagnosis not present

## 2023-12-17 DIAGNOSIS — D689 Coagulation defect, unspecified: Secondary | ICD-10-CM | POA: Diagnosis not present

## 2023-12-18 NOTE — Patient Instructions (Signed)

## 2023-12-20 DIAGNOSIS — N186 End stage renal disease: Secondary | ICD-10-CM | POA: Diagnosis not present

## 2023-12-20 DIAGNOSIS — N2581 Secondary hyperparathyroidism of renal origin: Secondary | ICD-10-CM | POA: Diagnosis not present

## 2023-12-20 DIAGNOSIS — R52 Pain, unspecified: Secondary | ICD-10-CM | POA: Diagnosis not present

## 2023-12-20 DIAGNOSIS — E8770 Fluid overload, unspecified: Secondary | ICD-10-CM | POA: Diagnosis not present

## 2023-12-20 DIAGNOSIS — D689 Coagulation defect, unspecified: Secondary | ICD-10-CM | POA: Diagnosis not present

## 2023-12-20 DIAGNOSIS — Z992 Dependence on renal dialysis: Secondary | ICD-10-CM | POA: Diagnosis not present

## 2023-12-20 DIAGNOSIS — L299 Pruritus, unspecified: Secondary | ICD-10-CM | POA: Diagnosis not present

## 2023-12-21 ENCOUNTER — Ambulatory Visit: Admitting: Nurse Practitioner

## 2023-12-21 ENCOUNTER — Telehealth: Payer: Self-pay

## 2023-12-21 ENCOUNTER — Encounter: Payer: Self-pay | Admitting: Nurse Practitioner

## 2023-12-21 VITALS — BP 140/82 | HR 66 | Temp 98.7°F | Resp 16

## 2023-12-21 DIAGNOSIS — N186 End stage renal disease: Secondary | ICD-10-CM

## 2023-12-21 DIAGNOSIS — Z23 Encounter for immunization: Secondary | ICD-10-CM

## 2023-12-21 DIAGNOSIS — F5101 Primary insomnia: Secondary | ICD-10-CM

## 2023-12-21 DIAGNOSIS — Z89611 Acquired absence of right leg above knee: Secondary | ICD-10-CM

## 2023-12-21 DIAGNOSIS — I482 Chronic atrial fibrillation, unspecified: Secondary | ICD-10-CM | POA: Diagnosis not present

## 2023-12-21 DIAGNOSIS — G894 Chronic pain syndrome: Secondary | ICD-10-CM | POA: Diagnosis not present

## 2023-12-21 DIAGNOSIS — E1169 Type 2 diabetes mellitus with other specified complication: Secondary | ICD-10-CM

## 2023-12-21 DIAGNOSIS — I132 Hypertensive heart and chronic kidney disease with heart failure and with stage 5 chronic kidney disease, or end stage renal disease: Secondary | ICD-10-CM | POA: Diagnosis not present

## 2023-12-21 DIAGNOSIS — Z992 Dependence on renal dialysis: Secondary | ICD-10-CM

## 2023-12-21 DIAGNOSIS — D631 Anemia in chronic kidney disease: Secondary | ICD-10-CM

## 2023-12-21 DIAGNOSIS — N400 Enlarged prostate without lower urinary tract symptoms: Secondary | ICD-10-CM

## 2023-12-21 DIAGNOSIS — I5020 Unspecified systolic (congestive) heart failure: Secondary | ICD-10-CM | POA: Diagnosis not present

## 2023-12-21 DIAGNOSIS — E785 Hyperlipidemia, unspecified: Secondary | ICD-10-CM

## 2023-12-21 DIAGNOSIS — E1122 Type 2 diabetes mellitus with diabetic chronic kidney disease: Secondary | ICD-10-CM

## 2023-12-21 DIAGNOSIS — E669 Obesity, unspecified: Secondary | ICD-10-CM

## 2023-12-21 LAB — BAYER DCA HB A1C WAIVED: HB A1C (BAYER DCA - WAIVED): 4.9 % (ref 4.8–5.6)

## 2023-12-21 MED ORDER — POLYSACCHARIDE IRON COMPLEX 150 MG PO CAPS: 150.0000 mg | ORAL_CAPSULE | Freq: Every day | ORAL | 4 refills | Status: AC

## 2023-12-21 MED ORDER — CARVEDILOL 6.25 MG PO TABS: 6.2500 mg | ORAL_TABLET | Freq: Every day | ORAL | 4 refills | Status: AC

## 2023-12-21 MED ORDER — ELIQUIS 2.5 MG PO TABS: 2.5000 mg | ORAL_TABLET | Freq: Two times a day (BID) | ORAL | 4 refills | Status: AC

## 2023-12-21 MED ORDER — AMIODARONE HCL 200 MG PO TABS: 200.0000 mg | ORAL_TABLET | Freq: Two times a day (BID) | ORAL | 4 refills | Status: AC

## 2023-12-21 MED ORDER — ROSUVASTATIN CALCIUM 40 MG PO TABS: 40.0000 mg | ORAL_TABLET | Freq: Every day | ORAL | 4 refills | Status: AC

## 2023-12-21 MED ORDER — ZOLPIDEM TARTRATE 10 MG PO TABS: 10.0000 mg | ORAL_TABLET | Freq: Every evening | ORAL | 1 refills | Status: AC | PRN

## 2023-12-21 NOTE — Assessment & Plan Note (Signed)
 Chronic, ongoing.  Continue current medication regimen and adjust as needed.  Wrote down labs to get at dialysis for PCP review: Lipid panel, TSH, PSA.

## 2023-12-21 NOTE — Assessment & Plan Note (Signed)
 Chronic, ongoing.  Followed by nephrology at dialysis clinic.  Will attempt to obtain notes and labs.

## 2023-12-21 NOTE — Assessment & Plan Note (Signed)
 Chronic, ongoing with A1c 6% last check, recheck today.  Unable to obtain urine for ALB check.  At goal.  Continue off medications at this time.  Recommend he monitor BS at least a couple times a day and document for provider + focus on diabetic diet.   - Needs to schedule eye exam, recommend he do so. - Foot exam up to date. - Vaccination, refuses some. - Statin on board.  ESRD, no ACE or ARB at this time.

## 2023-12-21 NOTE — Assessment & Plan Note (Addendum)
 Chronic, ongoing.  Continue Ambien  which offers more benefit, has controlled subs agreement on chart.  Adjust regimen as needed.  Refills sent.  Will plan on repeat sleep study and see if need for CPAP. Refuses today.

## 2023-12-21 NOTE — Telephone Encounter (Signed)
 Copied from CRM [id number]. Topic: Clinical - Prescription Issue >> Dec 21, 2023  4:08 PM Adam Howell wrote: Reason for CRM: patient says [facility] has not received his prescription. On my end, prescription was received at 10am. Advised patient to call [facility].

## 2023-12-21 NOTE — Assessment & Plan Note (Signed)
 Performed 03/13/22.  Continue collaboration with vascular and monitor skin status closely.  Order for PT home health to work on using prosthetic.

## 2023-12-21 NOTE — Assessment & Plan Note (Signed)
 Chronic, ongoing, followed by dialysis team.  BP well below goal for ESRD in office today, monitor closely.  Continue current medication regimen as prescribed by nephrology.  Attempt to obtain recent notes from [facility].  Labs today: with dialysis recently. Recommend he monitor BP at home a few days a week and document + focus on DASH diet.  Recent nephrology notes reviewed.

## 2023-12-21 NOTE — Progress Notes (Signed)
 BP (!) 140/82 (BP Location: Left Arm, Patient Position: Sitting, Cuff Size: Normal)   Pulse 66   Temp 98.7 F (37.1 C) (Oral)   Resp 16   SpO2 94%    Subjective:    [name], male    DOB: [birth year], 63 y.o.   MRN: [id number]  HPI: Adam Howell is a 63 y.o. male  Chief Complaint  Patient presents with   Follow-up   Hypertension   DIABETES A1c May 6%.  Range since August 2020 -- 4.7 to 6.8%. Continues to be diet controlled.  History above knee amputation to right leg on 03/13/22. Last visit with vascular was on 06/29/23.  Had a wound to left lower leg that has been healed up for awhile now. Has prosthetic for right leg, but has no one to teach him how to use.  Reports PT came for a bit and then stopped.  Having left foot pain, is going to see pain management on October 14th.   Hypoglycemic episodes:yes Polydipsia/polyuria: no Visual disturbance: no Chest pain: no Paresthesias: no Glucose Monitoring: yes             Accucheck frequency: not checking             Fasting glucose:              Post prandial:             Evening:             Before meals: Taking Insulin ?: yes             Long acting insulin :              Short acting insulin :  Blood Pressure Monitoring: not checking Retinal Examination: Not up to Date Foot Exam: Up To Date Pneumovax: Up to Date  Influenza: Up to Date Aspirin : no    HYPERTENSION / HYPERLIPIDEMIA/HF Continues Eliquis , Amiodarone , Renvela , Crestor , and Bumex .  Last saw Dr. Mady with cardiology on 11/10/19 -- has not returned since this time, missed follow-up on 09/10/22. EF on 01/10/20 was 35-40%.   Satisfied with current treatment? yes Duration of hypertension: chronic BP monitoring frequency: not checking BP range:  BP medication side effects: no Duration of hyperlipidemia: chronic Cholesterol medication side effects: no Cholesterol supplements: none Medication compliance: good compliance Aspirin : no Recent  stressors: no Recurrent headaches: no Visual changes: no Palpitations: no Dyspnea: no Chest pain: no Lower extremity edema: no Dizzy/lightheaded: no    CHRONIC KIDNEY DISEASE (ESRD) Goes to dialysis Monday, Wednesday, Friday. Sees nephrology in clinic with dialysis. CKD status: stable Medications renally dose: yes Previous renal evaluation: yes Pneumovax:  Up to Date Influenza Vaccine:  Up to Date    INSOMNIA Takes Ambien  as needed at night, tried other medications in past without benefit. Duration: chronic Satisfied with sleep quality: yes Difficulty falling asleep: yes Difficulty staying asleep: no Waking a few hours after sleep onset: no Early morning awakenings: no Daytime hypersomnolence: no Wakes feeling refreshed: yes Good sleep hygiene: yes Apnea: no Snoring: no Depressed/anxious mood: no Recent stress: no Restless legs/nocturnal leg cramps: no Chronic pain/arthritis: no History of sleep study: yes, no CPAP Treatments attempted: Ambien    Relevant past medical, surgical, family and social history reviewed and updated as indicated. Interim medical history since our last visit reviewed. Allergies and medications reviewed and updated.  Review of Systems  Constitutional:  Negative for activity change, diaphoresis, fatigue and fever.  Respiratory:  Negative for  cough, chest tightness, shortness of breath and wheezing.   Cardiovascular:  Negative for chest pain, palpitations and leg swelling.  Gastrointestinal: Negative.   Endocrine: Negative for polydipsia, polyphagia and polyuria.  Neurological: Negative.   Psychiatric/Behavioral: Negative.      Per HPI unless specifically indicated above     Objective:    BP (!) 140/82 (BP Location: Left Arm, Patient Position: Sitting, Cuff Size: Normal)   Pulse 66   Temp 98.7 F (37.1 C) (Oral)   Resp 16   SpO2 94%   Wt Readings from Last 3 Encounters:  12/10/23 127 lb (57.6 kg)  11/01/23 233 lb 4 oz (105.8 kg)   09/16/23 228 lb (103.4 kg)    Physical Exam Vitals and nursing note reviewed.  Constitutional:      General: He is awake. He is not in acute distress.    Appearance: He is well-developed and well-groomed. He is obese. He is not ill-appearing or toxic-appearing.  HENT:     Head: Normocephalic and atraumatic.     Right Ear: Hearing normal. No drainage.     Left Ear: Hearing normal. No drainage.  Eyes:     General: Lids are normal.        Right eye: No discharge.        Left eye: No discharge.     Conjunctiva/sclera: Conjunctivae normal.     Pupils: Pupils are equal, round, and reactive to light.  Neck:     Thyroid : No thyromegaly.     Vascular: No carotid bruit.     Trachea: Trachea normal.  Cardiovascular:     Rate and Rhythm: Normal rate and regular rhythm.     Pulses:          Dorsalis pedis pulses are 1+ on the left side.       Posterior tibial pulses are 1+ on the left side.     Heart sounds: Normal heart sounds, S1 normal and S2 normal. No murmur heard.    No gallop.     Arteriovenous access: Left arteriovenous access is present.     Comments: Decreased hair pattern bilateral legs. Pulmonary:     Effort: Pulmonary effort is normal. No accessory muscle usage or respiratory distress.     Breath sounds: Normal breath sounds.  Abdominal:     General: Bowel sounds are normal. There is no distension.     Palpations: Abdomen is soft.     Tenderness: There is no abdominal tenderness.  Musculoskeletal:        General: Normal range of motion.     Cervical back: Normal range of motion and neck supple.     Left lower leg: No edema.     Right Lower Extremity: Right leg is amputated above knee.  Skin:    General: Skin is warm and dry.     Capillary Refill: Capillary refill takes less than 2 seconds.     Findings: No rash.  Neurological:     Mental Status: [name] and oriented to person, place, and time.     Deep Tendon Reflexes: Reflexes are normal and symmetric.   Psychiatric:        Attention and Perception: Attention normal.        Mood and Affect: Mood normal.        Speech: Speech normal.        Behavior: Behavior normal. Behavior is cooperative.        Thought Content: Thought content normal.  Judgment: Judgment normal.    Diabetic Foot Exam - Simple   Simple Foot Form Visual Inspection See comments: Yes Sensation Testing Intact to touch and monofilament testing bilaterally: Yes Pulse Check See comments: Yes Comments Right AKA, intact.  Left foot very dry skin and thick toenails.  Scabbing and scaring over old wound to left lower shin. Skin intact at present.  No wounds noted. Calluses to foot. 1+ DP and PT.    Results for orders placed or performed during the hospital encounter of 12/10/23  CBC with Differential   Collection Time: 12/10/23 12:41 PM  Result Value Ref Range   WBC 3.4 (L) 4.0 - 10.5 K/uL   RBC 3.79 (L) 4.22 - 5.81 MIL/uL   Hemoglobin 9.7 (L) 13.0 - 17.0 g/dL   HCT 68.9 (L) 60.9 - 47.9 %   MCV 81.8 80.0 - 100.0 fL   MCH 25.6 (L) 26.0 - 34.0 pg   MCHC 31.3 30.0 - 36.0 g/dL   RDW 80.0 (H) 88.4 - 84.4 %   Platelets 121 (L) 150 - 400 K/uL   nRBC 0.0 0.0 - 0.2 %   Neutrophils Relative % 62 %   Neutro Abs 2.1 1.7 - 7.7 K/uL   Lymphocytes Relative 23 %   Lymphs Abs 0.8 0.7 - 4.0 K/uL   Monocytes Relative 9 %   Monocytes Absolute 0.3 0.1 - 1.0 K/uL   Eosinophils Relative 4 %   Eosinophils Absolute 0.1 0.0 - 0.5 K/uL   Basophils Relative 1 %   Basophils Absolute 0.0 0.0 - 0.1 K/uL   Immature Granulocytes 1 %   Abs Immature Granulocytes 0.02 0.00 - 0.07 K/uL  Basic metabolic panel   Collection Time: 12/10/23 12:41 PM  Result Value Ref Range   Sodium 141 135 - 145 mmol/L   Potassium 3.6 3.5 - 5.1 mmol/L   Chloride 97 (L) 98 - 111 mmol/L   CO2 27 22 - 32 mmol/L   Glucose, Bld 118 (H) 70 - 99 mg/dL   BUN 41 (H) 8 - 23 mg/dL   Creatinine, Ser 2.32 (H) 0.61 - 1.24 mg/dL   Calcium  8.0 (L) 8.9 - 10.3 mg/dL    GFR, Estimated 7 (L) >60 mL/min   Anion gap 17 (H) 5 - 15      Assessment & Plan:   Problem List Items Addressed This Visit       Cardiovascular and Mediastinum   Hypertensive heart and kidney disease with heart failure and chronic kidney disease stage V (HCC)   Chronic, ongoing, followed by dialysis team.  BP well below goal for ESRD in office today, monitor closely.  Continue current medication regimen as prescribed by nephrology.  Attempt to obtain recent notes from [facility].  Labs today: with dialysis recently. Recommend he monitor BP at home a few days a week and document + focus on DASH diet.  Recent nephrology notes reviewed.        Relevant Medications   amiodarone  (PACERONE ) 200 MG tablet   carvedilol  (COREG ) 6.25 MG tablet   ELIQUIS  2.5 MG TABS tablet   rosuvastatin  (CRESTOR ) 40 MG tablet   Heart failure with reduced ejection fraction (HCC)   Chronic, stable, euvolemic today.  Continue current medication regimen and adjust as needed.  Continue collaboration with cardiology, highly recommend he schedule follow-up ASAP. Last visit August 2021 and missed recent visit.  Discussed at length. - Reminded to call for an overnight weight gain of >2 pounds or a weekly weight weight of >  5 pounds - not adding salt to his food and has been reading food labels. Reviewed the importance of keeping daily sodium intake to 2000mg  daily  - Avoid NSAIDS      Relevant Medications   amiodarone  (PACERONE ) 200 MG tablet   carvedilol  (COREG ) 6.25 MG tablet   ELIQUIS  2.5 MG TABS tablet   rosuvastatin  (CRESTOR ) 40 MG tablet   Atrial fibrillation, chronic (HCC)   Chronic, ongoing.  Rate controlled.  Continue current medication regimen.  Continue collaboration with cardiology, recommend he schedule follow-up ASAP. Last visit August 2021 and missed recent visit.  Discussed at length.       Relevant Medications   amiodarone  (PACERONE ) 200 MG tablet   carvedilol  (COREG ) 6.25 MG tablet   ELIQUIS  2.5  MG TABS tablet   rosuvastatin  (CRESTOR ) 40 MG tablet     Endocrine   Type 2 diabetes mellitus with ESRD (end-stage renal disease) (HCC) - Primary   Chronic, ongoing with A1c 6% last check, recheck today.  Unable to obtain urine for ALB check.  At goal.  Continue off medications at this time.  Recommend he monitor BS at least a couple times a day and document for provider + focus on diabetic diet.   - Needs to schedule eye exam, recommend he do so. - Foot exam up to date. - Vaccination, refuses some. - Statin on board.  ESRD, no ACE or ARB at this time.      Relevant Medications   rosuvastatin  (CRESTOR ) 40 MG tablet   Other Relevant Orders   Bayer DCA Hb A1c Waived   Ambulatory referral to Dentistry   Ambulatory referral to Home Health   Hyperlipidemia associated with type 2 diabetes mellitus (HCC)   Chronic, ongoing.  Continue current medication regimen and adjust as needed.  Wrote down labs to get at dialysis for PCP review: Lipid panel, TSH, PSA.       Relevant Medications   amiodarone  (PACERONE ) 200 MG tablet   carvedilol  (COREG ) 6.25 MG tablet   ELIQUIS  2.5 MG TABS tablet   rosuvastatin  (CRESTOR ) 40 MG tablet     Genitourinary   ESRD (end stage renal disease) on dialysis (HCC)   Chronic, ongoing.  Followed by nephrology at dialysis clinic.  Will attempt to obtain notes and labs.      Anemia in ESRD (end-stage renal disease) (HCC)   Chronic, ongoing.  Followed by nephrology at dialysis clinic.  Will attempt to obtain notes and labs.      Relevant Medications   iron  polysaccharides (NIFEREX) 150 MG capsule     Other   S/P AKA (above knee amputation), right (HCC)   Performed 03/13/22.  Continue collaboration with vascular and monitor skin status closely.  Order for PT home health to work on using prosthetic.        Relevant Orders   Ambulatory referral to Dentistry   Ambulatory referral to Home Health   Insomnia   Chronic, ongoing.  Continue Ambien  which offers more  benefit, has controlled subs agreement on chart.  Adjust regimen as needed.  Refills sent.  Will plan on repeat sleep study and see if need for CPAP. Refuses today.      Chronic pain syndrome   Ongoing, taking Norco prior to dialysis, however is needing more often and scheduled to see pain clinic in 7 days. Controlled substance contract up to date.          Follow up plan: Return in about 3 months (around 03/22/2024) for  T2DM, HTN/HLD, ESRD, INSOMNIA + needs nurse Medicare wellness scheduled.

## 2023-12-21 NOTE — Assessment & Plan Note (Signed)
 Ongoing, taking Norco prior to dialysis, however is needing more often and scheduled to see pain clinic in 7 days. Controlled substance contract up to date.

## 2023-12-21 NOTE — Assessment & Plan Note (Signed)
 Chronic, ongoing.  Rate controlled.  Continue current medication regimen.  Continue collaboration with cardiology, recommend he schedule follow-up ASAP. Last visit August 2021 and missed recent visit.  Discussed at length.

## 2023-12-21 NOTE — Assessment & Plan Note (Signed)
 Chronic, stable, euvolemic today.  Continue current medication regimen and adjust as needed.  Continue collaboration with cardiology, highly recommend he schedule follow-up ASAP. Last visit August 2021 and missed recent visit.  Discussed at length. - Reminded to call for an overnight weight gain of >2 pounds or a weekly weight weight of >5 pounds - not adding salt to his food and has been reading food labels. Reviewed the importance of keeping daily sodium intake to 2000mg  daily  - Avoid NSAIDS

## 2023-12-22 ENCOUNTER — Encounter: Admission: EM | Disposition: A | Discharge status: Home / Self Care | Payer: Self-pay | Attending: Emergency Medicine

## 2023-12-22 ENCOUNTER — Telehealth (INDEPENDENT_AMBULATORY_CARE_PROVIDER_SITE_OTHER): Payer: Self-pay

## 2023-12-22 ENCOUNTER — Emergency Department
Admission: EM | Admit: 2023-12-22 | Discharge: 2023-12-22 | Disposition: A | Discharge status: Home / Self Care | Source: Other Acute Inpatient Hospital | Attending: Emergency Medicine | Admitting: Emergency Medicine

## 2023-12-22 ENCOUNTER — Other Ambulatory Visit: Payer: Self-pay

## 2023-12-22 ENCOUNTER — Encounter: Payer: Self-pay | Admitting: Emergency Medicine

## 2023-12-22 DIAGNOSIS — Z452 Encounter for adjustment and management of vascular access device: Secondary | ICD-10-CM | POA: Diagnosis not present

## 2023-12-22 DIAGNOSIS — D689 Coagulation defect, unspecified: Secondary | ICD-10-CM | POA: Diagnosis not present

## 2023-12-22 DIAGNOSIS — N186 End stage renal disease: Secondary | ICD-10-CM | POA: Diagnosis not present

## 2023-12-22 DIAGNOSIS — N2581 Secondary hyperparathyroidism of renal origin: Secondary | ICD-10-CM | POA: Diagnosis not present

## 2023-12-22 DIAGNOSIS — R52 Pain, unspecified: Secondary | ICD-10-CM | POA: Diagnosis not present

## 2023-12-22 DIAGNOSIS — I10 Essential (primary) hypertension: Secondary | ICD-10-CM | POA: Diagnosis not present

## 2023-12-22 DIAGNOSIS — T8243XA Leakage of vascular dialysis catheter, initial encounter: Secondary | ICD-10-CM | POA: Diagnosis not present

## 2023-12-22 DIAGNOSIS — T829XXA Unspecified complication of cardiac and vascular prosthetic device, implant and graft, initial encounter: Secondary | ICD-10-CM

## 2023-12-22 DIAGNOSIS — Z7982 Long term (current) use of aspirin: Secondary | ICD-10-CM

## 2023-12-22 DIAGNOSIS — Z79899 Other long term (current) drug therapy: Secondary | ICD-10-CM | POA: Diagnosis not present

## 2023-12-22 DIAGNOSIS — Z992 Dependence on renal dialysis: Secondary | ICD-10-CM

## 2023-12-22 DIAGNOSIS — Y712 Prosthetic and other implants, materials and accessory cardiovascular devices associated with adverse incidents: Secondary | ICD-10-CM | POA: Insufficient documentation

## 2023-12-22 DIAGNOSIS — E8770 Fluid overload, unspecified: Secondary | ICD-10-CM | POA: Diagnosis not present

## 2023-12-22 DIAGNOSIS — Z7401 Bed confinement status: Secondary | ICD-10-CM | POA: Diagnosis not present

## 2023-12-22 DIAGNOSIS — L299 Pruritus, unspecified: Secondary | ICD-10-CM | POA: Diagnosis not present

## 2023-12-22 DIAGNOSIS — T82598A Other mechanical complication of other cardiac and vascular devices and implants, initial encounter: Secondary | ICD-10-CM | POA: Diagnosis not present

## 2023-12-22 DIAGNOSIS — Z743 Need for continuous supervision: Secondary | ICD-10-CM | POA: Diagnosis not present

## 2023-12-22 DIAGNOSIS — Z7901 Long term (current) use of anticoagulants: Secondary | ICD-10-CM | POA: Diagnosis not present

## 2023-12-22 DIAGNOSIS — T8249XA Other complication of vascular dialysis catheter, initial encounter: Secondary | ICD-10-CM

## 2023-12-22 DIAGNOSIS — T82898A Other specified complication of vascular prosthetic devices, implants and grafts, initial encounter: Secondary | ICD-10-CM | POA: Diagnosis not present

## 2023-12-22 DIAGNOSIS — Z789 Other specified health status: Secondary | ICD-10-CM

## 2023-12-22 HISTORY — PX: DIALYSIS/PERMA CATHETER REPAIR: CATH118293

## 2023-12-22 LAB — COMPREHENSIVE METABOLIC PANEL WITH GFR
ALT: 18 U/L (ref 0–44)
AST: 30 U/L (ref 15–41)
Albumin: 3.1 g/dL — ABNORMAL LOW (ref 3.5–5.0)
Alkaline Phosphatase: 88 U/L (ref 38–126)
Anion gap: 13 (ref 5–15)
BUN: 55 mg/dL — ABNORMAL HIGH (ref 8–23)
CO2: 24 mmol/L (ref 22–32)
Calcium: 8 mg/dL — ABNORMAL LOW (ref 8.9–10.3)
Chloride: 102 mmol/L (ref 98–111)
Creatinine, Ser: 7.71 mg/dL — ABNORMAL HIGH (ref 0.61–1.24)
GFR, Estimated: 7 mL/min — ABNORMAL LOW (ref >60)
Glucose, Bld: 104 mg/dL — ABNORMAL HIGH (ref 70–99)
Potassium: 3.7 mmol/L (ref 3.5–5.1)
Sodium: 139 mmol/L (ref 135–145)
Total Bilirubin: 1.8 mg/dL — ABNORMAL HIGH (ref 0.0–1.2)
Total Protein: 7.6 g/dL (ref 6.5–8.1)

## 2023-12-22 LAB — CBC WITH DIFFERENTIAL/PLATELET
Abs Immature Granulocytes: 0.02 K/uL (ref 0.00–0.07)
Basophils Absolute: 0.1 K/uL (ref 0.0–0.1)
Basophils Relative: 1 %
Eosinophils Absolute: 0.3 K/uL (ref 0.0–0.5)
Eosinophils Relative: 6 %
HCT: 31.9 % — ABNORMAL LOW (ref 39.0–52.0)
Hemoglobin: 10 g/dL — ABNORMAL LOW (ref 13.0–17.0)
Immature Granulocytes: 1 %
Lymphocytes Relative: 14 %
Lymphs Abs: 0.6 K/uL — ABNORMAL LOW (ref 0.7–4.0)
MCH: 25.3 pg — ABNORMAL LOW (ref 26.0–34.0)
MCHC: 31.3 g/dL (ref 30.0–36.0)
MCV: 80.8 fL (ref 80.0–100.0)
Monocytes Absolute: 0.3 K/uL (ref 0.1–1.0)
Monocytes Relative: 8 %
Neutro Abs: 3.1 K/uL (ref 1.7–7.7)
Neutrophils Relative %: 70 %
Platelets: 159 K/uL (ref 150–400)
RBC: 3.95 MIL/uL — ABNORMAL LOW (ref 4.22–5.81)
RDW: 20.3 % — ABNORMAL HIGH (ref 11.5–15.5)
WBC: 4.4 K/uL (ref 4.0–10.5)
nRBC: 0 % (ref 0.0–0.2)

## 2023-12-22 SURGERY — DIALYSIS/PERMA CATHETER REPAIR
Anesthesia: Moderate Sedation | Laterality: Left

## 2023-12-22 MED ORDER — FENTANYL CITRATE (PF) 100 MCG/2ML IJ SOLN
INTRAMUSCULAR | Status: AC
  Filled 2023-12-22: qty 2

## 2023-12-22 MED ORDER — MIDAZOLAM HCL 2 MG/2ML IJ SOLN
INTRAMUSCULAR | Status: AC
  Filled 2023-12-22: qty 2

## 2023-12-22 MED ORDER — FENTANYL CITRATE PF 50 MCG/ML IJ SOSY
PREFILLED_SYRINGE | INTRAMUSCULAR | Status: DC | PRN
  Administered 2023-12-22: 50 ug via INTRAVENOUS

## 2023-12-22 MED ORDER — MIDAZOLAM HCL 2 MG/ML PO SYRP: 8.0000 mg | ORAL_SOLUTION | Freq: Once | ORAL | Status: DC | PRN

## 2023-12-22 MED ORDER — MIDAZOLAM HCL 2 MG/2ML IJ SOLN
INTRAMUSCULAR | Status: DC | PRN
Start: 2023-12-22 — End: 2023-12-22
  Administered 2023-12-22: 2 mg via INTRAVENOUS

## 2023-12-22 MED ORDER — DIPHENHYDRAMINE HCL 50 MG/ML IJ SOLN: 50.0000 mg | Freq: Once | INTRAMUSCULAR | Status: DC | PRN

## 2023-12-22 MED ORDER — CEFAZOLIN SODIUM-DEXTROSE 1-4 GM/50ML-% IV SOLN
INTRAVENOUS | Status: AC
  Filled 2023-12-22: qty 50

## 2023-12-22 MED ORDER — HEPARIN (PORCINE) IN NACL 1000-0.9 UT/500ML-% IV SOLN
INTRAVENOUS | Status: DC | PRN
  Administered 2023-12-22: 500 mL

## 2023-12-22 MED ORDER — LIDOCAINE-EPINEPHRINE (PF) 1 %-1:200000 IJ SOLN
INTRAMUSCULAR | Status: DC | PRN
  Administered 2023-12-22: 20 mL

## 2023-12-22 MED ORDER — SODIUM CHLORIDE 0.9 % IV SOLN: INTRAVENOUS | Status: DC

## 2023-12-22 MED ORDER — CEFAZOLIN SODIUM-DEXTROSE 1-4 GM/50ML-% IV SOLN
1.0000 g | INTRAVENOUS | Status: AC
  Administered 2023-12-22: 1 g via INTRAVENOUS

## 2023-12-22 MED ORDER — FAMOTIDINE 20 MG PO TABS: 40.0000 mg | ORAL_TABLET | Freq: Once | ORAL | Status: DC | PRN

## 2023-12-22 MED ORDER — HEPARIN SODIUM (PORCINE) 10000 UNIT/ML IJ SOLN
INTRAMUSCULAR | Status: DC | PRN
  Administered 2023-12-22: 10000 [IU]

## 2023-12-22 MED ORDER — METHYLPREDNISOLONE SODIUM SUCC 125 MG IJ SOLR: 125.0000 mg | Freq: Once | INTRAMUSCULAR | Status: DC | PRN

## 2023-12-22 SURGICAL SUPPLY — 5 items
BIOPATCH RED 1 DISK 7.0 (GAUZE/BANDAGES/DRESSINGS) IMPLANT
CATH PALINDROME-P 23 W/VT (CATHETERS) IMPLANT
GUIDEWIRE SUPER STIFF .035X180 (WIRE) IMPLANT
SUT MNCRL AB 4-0 PS2 18 (SUTURE) IMPLANT
SUT SILK 0 FSL (SUTURE) IMPLANT

## 2023-12-22 NOTE — ED Triage Notes (Signed)
 First Nurse Note: Patient to ED from home for dialysis catheter problem. Dialysis states there is a crack in the fistula and unable to use it. Last received dialysis on Monday. VS WNL

## 2023-12-22 NOTE — Consult Note (Signed)
 Hospital Consult    Reason for Consult:  Leaking Left Chest Dialysis access catheter. Requesting Physician:  Dr Oneil Budge MD MRN #:  [id number]  History of Present Illness: This is a 63 y.o. male who presented to [name] emergency department back on 12/10/2023 for the same complaint.  The dialysis catheter in his chest is leaking at the red port site.  There is a hairline fracture at the end of the catheter.  He had a catheter exchange done on this day on 12/10/2023 by Dr. Cordella Shawl.  He returns to [name] emergency department today after trying to be dialyzed with the same week from the catheter tip.  Patient endorses that at the dialysis center they put the connections on too tight and when they try to get them off they are bending them which is causing his catheter to crack.  Upon examination this morning I was able to reproduce a hairline fracture in the end of the right tip of the catheter thus allowing the catheter to leak at that spot.  Patient's dialysis permacatheter to his left chest will need to be exchanged again today.  Vascular surgery was consulted to do so.  Past Medical History:  Diagnosis Date   Anemia in chronic kidney disease    Cellulitis of left lower extremity    Chronic atrial fibrillation (HCC)    Chronic kidney disease    Congestive heart failure (HCC)    Difficult intubation    End-stage renal disease on hemodialysis (HCC)    Hyperlipidemia    Hypertension    Type 2 diabetes mellitus with complication [facility])     Past Surgical History:  Procedure Laterality Date   A/V FISTULAGRAM Left 08/27/2022   Procedure: A/V Fistulagram;  Surgeon: Marea Selinda RAMAN, MD;  Location: [facility] INVASIVE CV LAB;  Service: Cardiovascular;  Laterality: Left;   A/V FISTULAGRAM Left 08/12/2023   Procedure: A/V Fistulagram;  Surgeon: Marea Selinda RAMAN, MD;  Location: [facility] INVASIVE CV LAB;  Service: Cardiovascular;  Laterality: Left;   AMPUTATION Right 03/13/2022   Procedure: AMPUTATION ABOVE  KNEE;  Surgeon: Marea Selinda RAMAN, MD;  Location: [facility] ORS;  Service: General;  Laterality: Right;   APPLICATION OF WOUND VAC Right 03/07/2022   Procedure: APPLICATION OF WOUND VAC;  Surgeon: Tisa Curry LABOR, MD;  Location: [facility] ORS;  Service: Vascular;  Laterality: Right;  GAAC [id number]   CENTRAL LINE INSERTION Left 08/05/2023   Procedure: CENTRAL LINE INSERTION;  Surgeon: Marea Selinda RAMAN, MD;  Location: [facility] ORS;  Service: Vascular;  Laterality: Left;   DIALYSIS/PERMA CATHETER INSERTION Right 08/05/2023   Procedure: DIALYSIS/PERMA CATHETER INSERTION;  Surgeon: Marea Selinda RAMAN, MD;  Location: [facility] ORS;  Service: Vascular;  Laterality: Right;   DIALYSIS/PERMA CATHETER INSERTION N/A 09/06/2023   Procedure: DIALYSIS/PERMA CATHETER INSERTION;  Surgeon: Marea Selinda RAMAN, MD;  Location: [facility] INVASIVE CV LAB;  Service: Cardiovascular;  Laterality: N/A;   DIALYSIS/PERMA CATHETER INSERTION N/A 12/10/2023   Procedure: DIALYSIS/PERMA CATHETER INSERTION;  Surgeon: Shawl Cordella MATSU, MD;  Location: [facility] INVASIVE CV LAB;  Service: Cardiovascular;  Laterality: N/A;   DIALYSIS/PERMA CATHETER REMOVAL Right 08/31/2023   Procedure: DIALYSIS/PERMA CATHETER REMOVAL;  Surgeon: Shawl Cordella MATSU, MD;  Location: [facility] INVASIVE CV LAB;  Service: Cardiovascular;  Laterality: Right;   GROIN DISSECTION Right 08/05/2023   Procedure: right groin venogram, angioplasty of superior venacava and anomic vein;  Surgeon: Marea Selinda RAMAN, MD;  Location: [facility] ORS;  Service: Vascular;  Laterality: Right;   LOWER EXTREMITY ANGIOGRAPHY Left 02/15/2023  Procedure: Lower Extremity Angiography;  Surgeon: Marea Selinda RAMAN, MD;  Location: [facility] INVASIVE CV LAB;  Service: Cardiovascular;  Laterality: Left;   REVISON OF ARTERIOVENOUS FISTULA Left 08/05/2023   Procedure: REVISON OF ARTERIOVENOUS FISTULA;  Surgeon: Marea Selinda RAMAN, MD;  Location: [facility] ORS;  Service: Vascular;  Laterality: Left;  JUMP GRAFT   REVISON OF ARTERIOVENOUS FISTULA Left 09/01/2023   Procedure: REVISON OF  ARTERIOVENOUS FISTULA; INSERTION OF TEMPORARY HEMODIALYSIS CATHETER IN LEFT GROIN;  Surgeon: Marea Selinda RAMAN, MD;  Location: [facility] ORS;  Service: Vascular;  Laterality: Left;  excision of AV fistula graft   TEE WITHOUT CARDIOVERSION N/A 09/03/2023   Procedure: ECHOCARDIOGRAM, TRANSESOPHAGEAL;  Surgeon: Perla Evalene PARAS, MD;  Location: [facility] ORS;  Service: Cardiovascular;  Laterality: N/A;   WOUND DEBRIDEMENT Left    WOUND DEBRIDEMENT Right 01/16/2022   Procedure: DEBRIDEMENT WOUND;  Surgeon: Jama Cordella MATSU, MD;  Location: [facility] ORS;  Service: Vascular;  Laterality: Right;  Wound vac placement   WOUND DEBRIDEMENT Right 03/07/2022   Procedure: DEBRIDEMENT WOUND RIGHT LOWER EXTREMITY;  Surgeon: Tisa Curry LABOR, MD;  Location: [facility] ORS;  Service: Vascular;  Laterality: Right;    Allergies  Allergen Reactions   Ivp Dye [Iodinated Contrast Media] Anaphylaxis   Tramadol  Rash    Prior to Admission medications   Medication Sig Start Date End Date Taking? Authorizing Provider  acetaminophen  (TYLENOL ) 325 MG tablet Take 2 tablets (650 mg total) by mouth every 6 (six) hours as needed for mild pain (pain score 1-3), fever or headache (or Fever >/= 101). 03/04/23   Von Bellis, MD  amiodarone  (PACERONE ) 200 MG tablet Take 1 tablet (200 mg total) by mouth 2 (two) times daily. 12/21/23   Cannady, Jolene T, NP  aspirin  EC 81 MG tablet Take 1 tablet (81 mg total) by mouth daily. Swallow whole. 03/05/23   Von Bellis, MD  carvedilol  (COREG ) 6.25 MG tablet Take 1 tablet (6.25 mg total) by mouth daily. 12/21/23   Cannady, Jolene T, NP  ELIQUIS  2.5 MG TABS tablet Take 1 tablet (2.5 mg total) by mouth 2 (two) times daily. 12/21/23   Cannady, Jolene T, NP  iron  polysaccharides (NIFEREX) 150 MG capsule Take 1 capsule (150 mg total) by mouth daily. 12/21/23   Cannady, Jolene T, NP  ondansetron  (ZOFRAN ) 4 MG tablet Take 1 tablet (4 mg total) by mouth every 6 (six) hours as needed for nausea or vomiting. 12/09/23   Cannady,  Jolene T, NP  rosuvastatin  (CRESTOR ) 40 MG tablet Take 1 tablet (40 mg total) by mouth daily. 12/21/23   Cannady, Jolene T, NP  sevelamer  carbonate (RENVELA ) 800 MG tablet Take 3 tablets (2,400 mg total) by mouth 3 (three) times daily. 12/12/23   Cannady, Jolene T, NP  silver  sulfADIAZINE  (SILVADENE ) 1 % cream Apply 1 Application topically daily. 09/28/23   Cannady, Jolene T, NP  zolpidem  (AMBIEN ) 10 MG tablet Take 1 tablet (10 mg total) by mouth at bedtime as needed for sleep. 12/21/23   Valerio Melanie DASEN, NP    Social History   Socioeconomic History   Marital status: Divorced    Spouse name: Adam Howell   Number of children: 4   Years of education: Not on file   Highest education level: Not on file  Occupational History   Not on file  Tobacco Use   Smoking status: Never   Smokeless tobacco: Never  Vaping Use   Vaping status: Never Used  Substance and Sexual Activity   Alcohol use: Not  Currently   Drug use: Never   Sexual activity: Not Currently  Other Topics Concern   Not on file  Social History Narrative   Lives with daughter Adam Howell.     Social Drivers of Corporate investment banker Strain: Low Risk  (11/25/2021)   Overall Financial Resource Strain (CARDIA)    Difficulty of Paying Living Expenses: Not hard at all  Food Insecurity: No Food Insecurity (10/26/2023)   Hunger Vital Sign    Worried About Running Out of Food in the Last Year: Never true    Ran Out of Food in the Last Year: Never true  Transportation Needs: No Transportation Needs (10/26/2023)   PRAPARE - Administrator, Civil Service (Medical): No    Lack of Transportation (Non-Medical): No  Physical Activity: Insufficiently Active (11/25/2021)   Exercise Vital Sign    Days of Exercise per Week: 2 days    Minutes of Exercise per Session: 60 min  Stress: No Stress Concern Present (11/25/2021)   [employer] of Occupational Health - Occupational Stress Questionnaire    Feeling of Stress : Not  at all  Social Connections: Moderately Isolated (10/26/2023)   Social Connection and Isolation Panel    Frequency of Communication with Friends and Family: Three times a week    Frequency of Social Gatherings with Friends and Family: Three times a week    Attends Religious Services: More than 4 times per year    Active Member of Clubs or Organizations: No    Attends Banker Meetings: Never    Marital Status: Separated  Intimate Partner Violence: Not At Risk (10/26/2023)   Humiliation, Afraid, Rape, and Kick questionnaire    Fear of Current or Ex-Partner: No    Emotionally Abused: No    Physically Abused: No    Sexually Abused: No     Family History  Problem Relation Age of Onset   Heart failure Mother    Cirrhosis Father    Hypertension Daughter     ROS: Otherwise negative unless mentioned in HPI  Physical Examination  Vitals:   12/22/23 1031  BP: (!) 151/88  Pulse: 69  Resp: 17  Temp: 98.3 F (36.8 C)  SpO2: 100%   Body mass index is 15.71 kg/m.  General:  WDWN in NAD Gait: Not observed HENT: WNL, normocephalic Pulmonary: normal non-labored breathing, without Rales, rhonchi,  wheezing Cardiac: regular, without  Murmurs, rubs or gallops; without carotid bruits Abdomen: Positive bowel sounds throughout, soft, NT/ND, no masses Skin: without rashes Vascular Exam/Pulses: Palpable pulses throughout, except patient HX of right AKA.  Extremities: without ischemic changes, without Gangrene , without cellulitis; without open wounds;  Musculoskeletal: no muscle wasting or atrophy  Neurologic: A&O X 3;  No focal weakness or paresthesias are detected; speech is fluent/normal Psychiatric:  The pt has Normal affect. Lymph:  Unremarkable  CBC    Component Value Date/Time   WBC 3.4 (L) 12/10/2023 1241   RBC 3.79 (L) 12/10/2023 1241   HGB 9.7 (L) 12/10/2023 1241   HGB 16.8 01/12/2023 1414   HCT 31.0 (L) 12/10/2023 1241   HCT 51.0 01/12/2023 1414   PLT 121 (L)  12/10/2023 1241   PLT 257 01/12/2023 1414   MCV 81.8 12/10/2023 1241   MCV 88 01/12/2023 1414   MCH 25.6 (L) 12/10/2023 1241   MCHC 31.3 12/10/2023 1241   RDW 19.9 (H) 12/10/2023 1241   RDW 14.8 01/12/2023 1414   LYMPHSABS 0.8 12/10/2023 1241  LYMPHSABS 1.0 01/12/2023 1414   MONOABS 0.3 12/10/2023 1241   EOSABS 0.1 12/10/2023 1241   EOSABS 0.2 01/12/2023 1414   BASOSABS 0.0 12/10/2023 1241   BASOSABS 0.1 01/12/2023 1414    BMET    Component Value Date/Time   NA 141 12/10/2023 1241   NA 136 04/14/2023 1610   K 3.6 12/10/2023 1241   CL 97 (L) 12/10/2023 1241   CO2 27 12/10/2023 1241   GLUCOSE 118 (H) 12/10/2023 1241   BUN 41 (H) 12/10/2023 1241   BUN 18 04/14/2023 1610   CREATININE 7.67 (H) 12/10/2023 1241   CALCIUM  8.0 (L) 12/10/2023 1241   GFRNONAA 7 (L) 12/10/2023 1241    COAGS: Lab Results  Component Value Date   INR 1.3 (H) 08/05/2023   INR 1.2 03/05/2022     Non-Invasive Vascular Imaging:   None Ordered.   Statin:  Yes.   Beta Blocker:  Yes.   Aspirin :  Yes.   ACEI:  No. ARB:  No. CCB use:  No Other antiplatelets/anticoagulants:  Yes.   Eliquis  2.5 mg BID    ASSESSMENT/PLAN: This is a 63 y.o. male who presents to [facility] emergency department this morning after attempting dialysis through his left chest dialysis permacatheter access.  Unfortunately the tip of the red port is a hairline fracture and it and it was leaking enough that they could not perform dialysis today.  Vascular surgery plans on taking the patient to the vascular lab later today for a dialysis permacatheter exchange.  I discussed with the patient in detail today the procedure, benefits, risk, and complications.  He verbalizes understanding stating he had the same thing done last week.  Answered all his questions.  He wishes to proceed as soon as possible.  He endorses he has not eaten since 5 PM yesterday.  Patient's labs have been drawn per nephrology's request.  This is to determine  whether or not the patient will need dialysis here or on an outpatient basis.    -I discussed the case in detail with Dr. Cordella Shawl MD and he agrees with the plan.   Adam Howell [facility] 12/22/2023 12:04 PM

## 2023-12-22 NOTE — Telephone Encounter (Signed)
 Patient has been scheduled for a permcath exchange with Dr. Marea on 12/23/23 with a 1:00 pm arrival time to the [facility]. Pre-procedure instructions will be faxed to [facility] as requested.

## 2023-12-22 NOTE — H&P (View-Only) (Signed)
 Hospital Consult    Reason for Consult:  Leaking Left Chest Dialysis access catheter. Requesting Physician:  Dr Oneil Budge MD MRN #:  [id number]  History of Present Illness: This is a 63 y.o. male who presented to [name] emergency department back on 12/10/2023 for the same complaint.  The dialysis catheter in his chest is leaking at the red port site.  There is a hairline fracture at the end of the catheter.  He had a catheter exchange done on this day on 12/10/2023 by Dr. Cordella Shawl.  He returns to [name] emergency department today after trying to be dialyzed with the same week from the catheter tip.  Patient endorses that at the dialysis center they put the connections on too tight and when they try to get them off they are bending them which is causing his catheter to crack.  Upon examination this morning I was able to reproduce a hairline fracture in the end of the right tip of the catheter thus allowing the catheter to leak at that spot.  Patient's dialysis permacatheter to his left chest will need to be exchanged again today.  Vascular surgery was consulted to do so.  Past Medical History:  Diagnosis Date   Anemia in chronic kidney disease    Cellulitis of left lower extremity    Chronic atrial fibrillation (HCC)    Chronic kidney disease    Congestive heart failure (HCC)    Difficult intubation    End-stage renal disease on hemodialysis (HCC)    Hyperlipidemia    Hypertension    Type 2 diabetes mellitus with complication [facility])     Past Surgical History:  Procedure Laterality Date   A/V FISTULAGRAM Left 08/27/2022   Procedure: A/V Fistulagram;  Surgeon: Marea Selinda RAMAN, MD;  Location: [facility] INVASIVE CV LAB;  Service: Cardiovascular;  Laterality: Left;   A/V FISTULAGRAM Left 08/12/2023   Procedure: A/V Fistulagram;  Surgeon: Marea Selinda RAMAN, MD;  Location: [facility] INVASIVE CV LAB;  Service: Cardiovascular;  Laterality: Left;   AMPUTATION Right 03/13/2022   Procedure: AMPUTATION ABOVE  KNEE;  Surgeon: Marea Selinda RAMAN, MD;  Location: [facility] ORS;  Service: General;  Laterality: Right;   APPLICATION OF WOUND VAC Right 03/07/2022   Procedure: APPLICATION OF WOUND VAC;  Surgeon: Tisa Curry LABOR, MD;  Location: [facility] ORS;  Service: Vascular;  Laterality: Right;  GAAC [id number]   CENTRAL LINE INSERTION Left 08/05/2023   Procedure: CENTRAL LINE INSERTION;  Surgeon: Marea Selinda RAMAN, MD;  Location: [facility] ORS;  Service: Vascular;  Laterality: Left;   DIALYSIS/PERMA CATHETER INSERTION Right 08/05/2023   Procedure: DIALYSIS/PERMA CATHETER INSERTION;  Surgeon: Marea Selinda RAMAN, MD;  Location: [facility] ORS;  Service: Vascular;  Laterality: Right;   DIALYSIS/PERMA CATHETER INSERTION N/A 09/06/2023   Procedure: DIALYSIS/PERMA CATHETER INSERTION;  Surgeon: Marea Selinda RAMAN, MD;  Location: [facility] INVASIVE CV LAB;  Service: Cardiovascular;  Laterality: N/A;   DIALYSIS/PERMA CATHETER INSERTION N/A 12/10/2023   Procedure: DIALYSIS/PERMA CATHETER INSERTION;  Surgeon: Shawl Cordella MATSU, MD;  Location: [facility] INVASIVE CV LAB;  Service: Cardiovascular;  Laterality: N/A;   DIALYSIS/PERMA CATHETER REMOVAL Right 08/31/2023   Procedure: DIALYSIS/PERMA CATHETER REMOVAL;  Surgeon: Shawl Cordella MATSU, MD;  Location: [facility] INVASIVE CV LAB;  Service: Cardiovascular;  Laterality: Right;   GROIN DISSECTION Right 08/05/2023   Procedure: right groin venogram, angioplasty of superior venacava and anomic vein;  Surgeon: Marea Selinda RAMAN, MD;  Location: [facility] ORS;  Service: Vascular;  Laterality: Right;   LOWER EXTREMITY ANGIOGRAPHY Left 02/15/2023  Procedure: Lower Extremity Angiography;  Surgeon: Marea Selinda RAMAN, MD;  Location: [facility] INVASIVE CV LAB;  Service: Cardiovascular;  Laterality: Left;   REVISON OF ARTERIOVENOUS FISTULA Left 08/05/2023   Procedure: REVISON OF ARTERIOVENOUS FISTULA;  Surgeon: Marea Selinda RAMAN, MD;  Location: [facility] ORS;  Service: Vascular;  Laterality: Left;  JUMP GRAFT   REVISON OF ARTERIOVENOUS FISTULA Left 09/01/2023   Procedure: REVISON OF  ARTERIOVENOUS FISTULA; INSERTION OF TEMPORARY HEMODIALYSIS CATHETER IN LEFT GROIN;  Surgeon: Marea Selinda RAMAN, MD;  Location: [facility] ORS;  Service: Vascular;  Laterality: Left;  excision of AV fistula graft   TEE WITHOUT CARDIOVERSION N/A 09/03/2023   Procedure: ECHOCARDIOGRAM, TRANSESOPHAGEAL;  Surgeon: Perla Evalene PARAS, MD;  Location: [facility] ORS;  Service: Cardiovascular;  Laterality: N/A;   WOUND DEBRIDEMENT Left    WOUND DEBRIDEMENT Right 01/16/2022   Procedure: DEBRIDEMENT WOUND;  Surgeon: Jama Cordella MATSU, MD;  Location: [facility] ORS;  Service: Vascular;  Laterality: Right;  Wound vac placement   WOUND DEBRIDEMENT Right 03/07/2022   Procedure: DEBRIDEMENT WOUND RIGHT LOWER EXTREMITY;  Surgeon: Tisa Curry LABOR, MD;  Location: [facility] ORS;  Service: Vascular;  Laterality: Right;    Allergies  Allergen Reactions   Ivp Dye [Iodinated Contrast Media] Anaphylaxis   Tramadol  Rash    Prior to Admission medications   Medication Sig Start Date End Date Taking? Authorizing Provider  acetaminophen  (TYLENOL ) 325 MG tablet Take 2 tablets (650 mg total) by mouth every 6 (six) hours as needed for mild pain (pain score 1-3), fever or headache (or Fever >/= 101). 03/04/23   Von Bellis, MD  amiodarone  (PACERONE ) 200 MG tablet Take 1 tablet (200 mg total) by mouth 2 (two) times daily. 12/21/23   Cannady, Jolene T, NP  aspirin  EC 81 MG tablet Take 1 tablet (81 mg total) by mouth daily. Swallow whole. 03/05/23   Von Bellis, MD  carvedilol  (COREG ) 6.25 MG tablet Take 1 tablet (6.25 mg total) by mouth daily. 12/21/23   Cannady, Jolene T, NP  ELIQUIS  2.5 MG TABS tablet Take 1 tablet (2.5 mg total) by mouth 2 (two) times daily. 12/21/23   Cannady, Jolene T, NP  iron  polysaccharides (NIFEREX) 150 MG capsule Take 1 capsule (150 mg total) by mouth daily. 12/21/23   Cannady, Jolene T, NP  ondansetron  (ZOFRAN ) 4 MG tablet Take 1 tablet (4 mg total) by mouth every 6 (six) hours as needed for nausea or vomiting. 12/09/23   Cannady,  Jolene T, NP  rosuvastatin  (CRESTOR ) 40 MG tablet Take 1 tablet (40 mg total) by mouth daily. 12/21/23   Cannady, Jolene T, NP  sevelamer  carbonate (RENVELA ) 800 MG tablet Take 3 tablets (2,400 mg total) by mouth 3 (three) times daily. 12/12/23   Cannady, Jolene T, NP  silver  sulfADIAZINE  (SILVADENE ) 1 % cream Apply 1 Application topically daily. 09/28/23   Cannady, Jolene T, NP  zolpidem  (AMBIEN ) 10 MG tablet Take 1 tablet (10 mg total) by mouth at bedtime as needed for sleep. 12/21/23   Valerio Melanie DASEN, NP    Social History   Socioeconomic History   Marital status: Divorced    Spouse name: Kimothy Kishimoto   Number of children: 4   Years of education: Not on file   Highest education level: Not on file  Occupational History   Not on file  Tobacco Use   Smoking status: Never   Smokeless tobacco: Never  Vaping Use   Vaping status: Never Used  Substance and Sexual Activity   Alcohol use: Not  Currently   Drug use: Never   Sexual activity: Not Currently  Other Topics Concern   Not on file  Social History Narrative   Lives with daughter Rosaline.     Social Drivers of Corporate investment banker Strain: Low Risk  (11/25/2021)   Overall Financial Resource Strain (CARDIA)    Difficulty of Paying Living Expenses: Not hard at all  Food Insecurity: No Food Insecurity (10/26/2023)   Hunger Vital Sign    Worried About Running Out of Food in the Last Year: Never true    Ran Out of Food in the Last Year: Never true  Transportation Needs: No Transportation Needs (10/26/2023)   PRAPARE - Administrator, Civil Service (Medical): No    Lack of Transportation (Non-Medical): No  Physical Activity: Insufficiently Active (11/25/2021)   Exercise Vital Sign    Days of Exercise per Week: 2 days    Minutes of Exercise per Session: 60 min  Stress: No Stress Concern Present (11/25/2021)   [employer] of Occupational Health - Occupational Stress Questionnaire    Feeling of Stress : Not  at all  Social Connections: Moderately Isolated (10/26/2023)   Social Connection and Isolation Panel    Frequency of Communication with Friends and Family: Three times a week    Frequency of Social Gatherings with Friends and Family: Three times a week    Attends Religious Services: More than 4 times per year    Active Member of Clubs or Organizations: No    Attends Banker Meetings: Never    Marital Status: Separated  Intimate Partner Violence: Not At Risk (10/26/2023)   Humiliation, Afraid, Rape, and Kick questionnaire    Fear of Current or Ex-Partner: No    Emotionally Abused: No    Physically Abused: No    Sexually Abused: No     Family History  Problem Relation Age of Onset   Heart failure Mother    Cirrhosis Father    Hypertension Daughter     ROS: Otherwise negative unless mentioned in HPI  Physical Examination  Vitals:   12/22/23 1031  BP: (!) 151/88  Pulse: 69  Resp: 17  Temp: 98.3 F (36.8 C)  SpO2: 100%   Body mass index is 15.71 kg/m.  General:  WDWN in NAD Gait: Not observed HENT: WNL, normocephalic Pulmonary: normal non-labored breathing, without Rales, rhonchi,  wheezing Cardiac: regular, without  Murmurs, rubs or gallops; without carotid bruits Abdomen: Positive bowel sounds throughout, soft, NT/ND, no masses Skin: without rashes Vascular Exam/Pulses: Palpable pulses throughout, except patient HX of right AKA.  Extremities: without ischemic changes, without Gangrene , without cellulitis; without open wounds;  Musculoskeletal: no muscle wasting or atrophy  Neurologic: A&O X 3;  No focal weakness or paresthesias are detected; speech is fluent/normal Psychiatric:  The pt has Normal affect. Lymph:  Unremarkable  CBC    Component Value Date/Time   WBC 3.4 (L) 12/10/2023 1241   RBC 3.79 (L) 12/10/2023 1241   HGB 9.7 (L) 12/10/2023 1241   HGB 16.8 01/12/2023 1414   HCT 31.0 (L) 12/10/2023 1241   HCT 51.0 01/12/2023 1414   PLT 121 (L)  12/10/2023 1241   PLT 257 01/12/2023 1414   MCV 81.8 12/10/2023 1241   MCV 88 01/12/2023 1414   MCH 25.6 (L) 12/10/2023 1241   MCHC 31.3 12/10/2023 1241   RDW 19.9 (H) 12/10/2023 1241   RDW 14.8 01/12/2023 1414   LYMPHSABS 0.8 12/10/2023 1241  LYMPHSABS 1.0 01/12/2023 1414   MONOABS 0.3 12/10/2023 1241   EOSABS 0.1 12/10/2023 1241   EOSABS 0.2 01/12/2023 1414   BASOSABS 0.0 12/10/2023 1241   BASOSABS 0.1 01/12/2023 1414    BMET    Component Value Date/Time   NA 141 12/10/2023 1241   NA 136 04/14/2023 1610   K 3.6 12/10/2023 1241   CL 97 (L) 12/10/2023 1241   CO2 27 12/10/2023 1241   GLUCOSE 118 (H) 12/10/2023 1241   BUN 41 (H) 12/10/2023 1241   BUN 18 04/14/2023 1610   CREATININE 7.67 (H) 12/10/2023 1241   CALCIUM  8.0 (L) 12/10/2023 1241   GFRNONAA 7 (L) 12/10/2023 1241    COAGS: Lab Results  Component Value Date   INR 1.3 (H) 08/05/2023   INR 1.2 03/05/2022     Non-Invasive Vascular Imaging:   None Ordered.   Statin:  Yes.   Beta Blocker:  Yes.   Aspirin :  Yes.   ACEI:  No. ARB:  No. CCB use:  No Other antiplatelets/anticoagulants:  Yes.   Eliquis  2.5 mg BID    ASSESSMENT/PLAN: This is a 63 y.o. male who presents to [facility] emergency department this morning after attempting dialysis through his left chest dialysis permacatheter access.  Unfortunately the tip of the red port is a hairline fracture and it and it was leaking enough that they could not perform dialysis today.  Vascular surgery plans on taking the patient to the vascular lab later today for a dialysis permacatheter exchange.  I discussed with the patient in detail today the procedure, benefits, risk, and complications.  He verbalizes understanding stating he had the same thing done last week.  Answered all his questions.  He wishes to proceed as soon as possible.  He endorses he has not eaten since 5 PM yesterday.  Patient's labs have been drawn per nephrology's request.  This is to determine  whether or not the patient will need dialysis here or on an outpatient basis.    -I discussed the case in detail with Dr. Cordella Shawl MD and he agrees with the plan.   Gwendlyn JONELLE Shank [facility] 12/22/2023 12:04 PM

## 2023-12-22 NOTE — Progress Notes (Signed)
 Contacted by Nephrology regarding outpatient HD. Pt is having a procedure today to have dialysis catheter exchanged. Pt receives outpt HD at [facility] with 6:50am schedule. Pt is expected arrival back at clinic 12/23/23. Navigator will continue to assist as needed.   Suzen Satchel Dialysis Navigator [phone]

## 2023-12-22 NOTE — Op Note (Signed)
 [facility]   OPERATIVE NOTE     PROCEDURE: 1. Exchange left IJ tunneled dialysis catheter over wire same access   PRE-OPERATIVE DIAGNOSIS: Complication of dialysis device with nonfunction of tunneled catheter; end-stage renal requiring hemodialysis  POST-OPERATIVE DIAGNOSIS: same as above  SURGEON: Cordella JUDITHANN Howell, M.D.  ANESTHESIA: Conscious sedation was administered under my direct supervision by the interventional radiology RN.  IV Versed  plus fentanyl  were utilized. Continuous ECG, pulse oximetry and blood pressure was monitored throughout the entire procedure.  Conscious sedation was for a total of 15 minutes and 48 seconds.  ESTIMATED BLOOD LOSS: Minimal  FINDING(S): 1.  Tips of the catheter in the right atrium on fluoroscopy 2.  No obvious pneumothorax on fluoroscopy  SPECIMEN(S):  none  INDICATIONS:   Tavoris Brisk is a 63 y.o. male  presents with end stage renal disease.  Therefore, the patient requires a tunneled dialysis catheter placement.  The patient is informed of  the risks catheter placement include but are not limited to: bleeding, infection, central venous injury, pneumothorax, possible venous stenosis, possible malpositioning in the venous system, and possible infections related to long-term catheter presence.  The patient was aware of these risks and agreed to proceed.  DESCRIPTION: The patient was taken back to Special Procedure suite.  Prior to sedation, the patient was given IV antibiotics.  After obtaining adequate sedation, the patient was prepped and draped in the standard fashion for a chest or neck tunneled dialysis catheter placement.  Appropriate Time Out is called.   The left neck and chest wall are then infiltrated with 1% Lidocaine  with epinepherine.  A 23 cm tip to cuff catheter is then selected, opened on the back table and prepped.  The cuff of the existing catheter is localized.  Using both blunt and sharp  dissection the cuff is then freed from surrounding attachments. The catheter is then controlled with a hemostat and transected above the level of the cuff.  Under fluoroscopy an Amplatz Super Stiff wire is introduced through the catheter and negotiated into the inferior vena cava. The remaining portion of the catheter is then removed without difficulty.  A new catheter is then threaded over the wire and advanced under fluoroscopy and positioned so that the catheter tip is in the proximal atrium.  Each port was tested by aspirating and flushing.  No resistance was noted.  Each port was then thoroughly flushed with heparinized saline.  The catheter was secured in placed with two interrupted stitches of 0 silk tied to the catheter.   Each port was then packed with concentrated heparin  (10,000 Units/mL) at the manufacturer recommended volumes to each port.  Sterile caps were applied to each port.  On completion fluoroscopy, the tips of the catheter were in the right atrium, and there was no evidence of pneumothorax.  COMPLICATIONS: None  CONDITION: Adam Howell 12/22/2023,3:11 PM [facility] Office: [phone]   12/22/2023, 3:11 PM

## 2023-12-22 NOTE — ED Notes (Signed)
 NPO // Lab for Blood Draw

## 2023-12-22 NOTE — Discharge Instructions (Signed)
 Please follow-up with your dialysis session tomorrow at 6:50 AM at your normal dialysis center.  Return to the ER right away if you begin experiencing shortness of breath chest pain bleeding around the dialysis catheter or other concerns arise  Additionally if you have any shortness of breath difficulty breathing feel faint or other concerns arise return to the ER right away.

## 2023-12-22 NOTE — Interval H&P Note (Signed)
 History and Physical Interval Note:  12/22/2023 2:42 PM  Adam Howell  has presented today for surgery, with the diagnosis of Malfunctioning Dialysis Perma Catheter.  The various methods of treatment have been discussed with the patient and family. After consideration of risks, benefits and other options for treatment, the patient has consented to  Procedure(s): DIALYSIS/PERMA CATHETER REPAIR (Left) as a surgical intervention.  The patient's history has been reviewed, patient examined, no change in status, stable for surgery.  I have reviewed the patient's chart and labs.  Questions were answered to the patient's satisfaction.     Cordella Shawl

## 2023-12-22 NOTE — ED Provider Notes (Signed)
 [facility] Provider Note    Event Date/Time   First MD Initiated Contact with Patient 12/22/23 1048     (approximate)   History   Vascular catheter problem  HPI  Adam Howell is a 63 y.o. male end-stage renal disease.  He was scheduled for his routine dialysis today.  Reports from the connected him to his catheter located in the left upper chest that there was a leaking of blood.  He reports that he was told there might be a crack in the catheter.  His dialysis center referred him here for further evaluation of his dialysis/vascular access.  He is otherwise in his normal health no concerning symptoms no shortness of breath.  He is been feeling well.  Was scheduled for routine dialysis today when this occurred.  Is not bleeding now.  Reports it seem like it was a small amount of bleeding from the red catheter and that that he was told there might be a crack in it     Physical Exam   Triage Vital Signs: ED Triage Vitals  Encounter Vitals Group     BP 12/22/23 1031 (!) 151/88     Girls Systolic BP Percentile --      Girls Diastolic BP Percentile --      Boys Systolic BP Percentile --      Boys Diastolic BP Percentile --      Pulse Rate 12/22/23 1031 69     Resp 12/22/23 1031 17     Temp 12/22/23 1031 98.3 F (36.8 C)     Temp Source 12/22/23 1031 Oral     SpO2 12/22/23 1031 100 %     Weight 12/22/23 1032 125 lb 10.6 oz (57 kg)     Height 12/22/23 1032 6' 3 (1.905 m)     Head Circumference --      Peak Flow --      Pain Score 12/22/23 1032 0     Pain Loc --      Pain Education --      Exclude from Growth Chart --     Most recent vital signs: Vitals:   12/22/23 1515 12/22/23 1530  BP: (!) 162/118 (!) 160/87  Pulse: 67 75  Resp: (!) 23 (!) 26  Temp:    SpO2: 93% 96%     General: Awake, no distress.  CV:  Good peripheral perfusion.  Normal tones Resp:  Normal effort.  Clear bilateral normal work of breathing.  Moves from  wheelchair to bed without issue.  Old left lower extremity amputation Abd:  No distention.  Other:    Left subclavian hemodialysis catheter without any obvious bleeding at this time.  Skin site clean dry intact.  No active bleeding.  Patient reports it was bleeding from around the area of the arterial catheter.  I do not see any obvious crack in the line, but certainly will defer to vascular surgery for more thorough evaluation   ED Results / Procedures / Treatments   Labs (all labs ordered are listed, but only abnormal results are displayed) Labs Reviewed  CBC WITH DIFFERENTIAL/PLATELET - Abnormal; Notable for the following components:      Result Value   RBC 3.95 (*)    Hemoglobin 10.0 (*)    HCT 31.9 (*)    MCH 25.3 (*)    RDW 20.3 (*)    Lymphs Abs 0.6 (*)    All other components within normal limits  COMPREHENSIVE METABOLIC PANEL  WITH GFR - Abnormal; Notable for the following components:   Glucose, Bld 104 (*)    BUN 55 (*)    Creatinine, Ser 7.71 (*)    Calcium  8.0 (*)    Albumin  3.1 (*)    Total Bilirubin 1.8 (*)    GFR, Estimated 7 (*)    All other components within normal limits     EKG     RADIOLOGY     PROCEDURES:  Critical Care performed: No  Procedures   MEDICATIONS ORDERED IN ED: Medications  0.9 %  sodium chloride  infusion (has no administration in time range)  midazolam  (VERSED ) 2 MG/ML syrup 8 mg (has no administration in time range)  methylPREDNISolone  sodium succinate  (SOLU-MEDROL ) 125 mg/2 mL injection 125 mg (has no administration in time range)  famotidine  (PEPCID ) tablet 40 mg (has no administration in time range)  diphenhydrAMINE  (BENADRYL ) injection 50 mg (has no administration in time range)  lidocaine -EPINEPHrine  (PF) (XYLOCAINE -EPINEPHrine ) 1 %-1:200000 (PF) injection (20 mLs  Given 12/22/23 1437)  Heparin  (Porcine) in NaCl 1000-0.9 UT/500ML-% SOLN (500 mLs  Given 12/22/23 1437)  heparin  injection (10,000 Units Intracatheter Given  12/22/23 1437)  fentaNYL  (SUBLIMAZE ) injection (50 mcg Intravenous Given 12/22/23 1443)  midazolam  (VERSED ) injection (2 mg Intravenous Given 12/22/23 1443)  ceFAZolin  (ANCEF ) IVPB 1 g/50 mL premix (1 g Intravenous New Bag/Given 12/22/23 1443)     IMPRESSION / MDM / ASSESSMENT AND PLAN / ED COURSE  I reviewed the triage vital signs and the nursing notes.                              Differential diagnosis includes, but is not limited to, vascular access issue, lack of hemodialysis.  Will check basic electrolytes.  I have consulted with vascular surgery as well as nephrology Dr. Jama and Dr. Dennise for further evaluation and recommendations.  Patient's presentation is most consistent with acute complicated illness / injury requiring diagnostic workup.   Labs reviewed, potassium appropriate  The patient reports receiving routine dialysis, routine today.  Asymptomatic.  I do not anticipate any significant lab departures but we will check laboratory studies to facilitate consultation with nephrology and vascular surgery.  Developed plan for hemodialysis.  Nephrology team advises that the patient will be able to go to his dialysis center tomorrow at 650 AM.  They advised appropriate for discharge once new catheter placed and will get dialysis tomorrow at his normal center at 650 AM   Clinical Course as of 12/22/23 1606  Wed Dec 22, 2023  1234 Vascular surgery team advising patient will need to have catheter replaced or exchange.  Nephrology is seen.  Currently awaiting metabolic panel [MQ]    Clinical Course User Index [MQ] Dicky Anes, MD   ----------------------------------------- 4:06 PM on 12/22/2023 ----------------------------------------- Advised by vascular surgery Dr. Dreama, patient discharged by their team after procedure was completed.  FINAL CLINICAL IMPRESSION(S) / ED DIAGNOSES   Final diagnoses:  Problem with vascular access     Rx / DC Orders   ED Discharge  Orders          Ordered    Resume previous diet        12/22/23 1511    No dressing needed       Comments: Replace only if drainage present   12/22/23 1511    Call MD for:  temperature >100.5        12/22/23 1511    Call  MD for:  redness, tenderness, or signs of infection (pain, swelling, bleeding, redness, odor or green/yellow discharge around incision site)        12/22/23 1511    Call MD for:  severe or increased pain, loss or decreased feeling  in affected limb(s)        12/22/23 1511    Discharge instructions       Comments: Okay to use tunneled catheter for dialysis   12/22/23 1511             Note:  This document was prepared using Dragon voice recognition software and may include unintentional dictation errors.   Dicky Anes, MD 12/22/23 1606

## 2023-12-22 NOTE — Telephone Encounter (Signed)
 Ok for E2C2 to review.  Unable to reach patient, VM is full however did send SMS text message with office number to return call. Please advise him when he returns call that that these below medications are available and waiting for pick up.  amiodarone  (PACERONE ) 200 MG tablet  carvedilol  (COREG ) 6.25 MG tablet  ELIQUIS  2.5 MG TABS tablet  iron  polysaccharides (NIFEREX) 150 MG capsule  rosuvastatin  (CRESTOR ) 40 MG tablet  zolpidem  (AMBIEN ) 10 MG tablet   The ondansetron  (ZOFRAN ) 4 MG tablet had been filled once but when not picked up in time they put it back on the shelf. They are now working on filling it so I would not go try to pick up any before 2 pm today.   Lastly, sevelamer  carbonate (RENVELA ) 800 MG tablet should be processed through his Medicare B benefits however [facility] does not have that card on file. I also do not see it in our system so I could not provide. He must bring his Medicare Red[employer] White [employer] Blue card with him when he goes to the pharmacy so they can process it to the correct insurance. As it is currently it would cost around $225 but would be lowered if billed correctly.

## 2023-12-23 ENCOUNTER — Ambulatory Visit: Admission: RE | Admit: 2023-12-23 | Source: Home / Self Care | Admitting: Vascular Surgery

## 2023-12-23 ENCOUNTER — Encounter: Payer: Self-pay | Admitting: Vascular Surgery

## 2023-12-23 ENCOUNTER — Encounter: Admission: RE | Payer: Self-pay | Source: Home / Self Care

## 2023-12-23 DIAGNOSIS — R52 Pain, unspecified: Secondary | ICD-10-CM | POA: Diagnosis not present

## 2023-12-23 DIAGNOSIS — N186 End stage renal disease: Secondary | ICD-10-CM | POA: Diagnosis not present

## 2023-12-23 DIAGNOSIS — L299 Pruritus, unspecified: Secondary | ICD-10-CM | POA: Diagnosis not present

## 2023-12-23 DIAGNOSIS — D689 Coagulation defect, unspecified: Secondary | ICD-10-CM | POA: Diagnosis not present

## 2023-12-23 DIAGNOSIS — Z992 Dependence on renal dialysis: Secondary | ICD-10-CM | POA: Diagnosis not present

## 2023-12-23 DIAGNOSIS — N2581 Secondary hyperparathyroidism of renal origin: Secondary | ICD-10-CM | POA: Diagnosis not present

## 2023-12-23 DIAGNOSIS — E8770 Fluid overload, unspecified: Secondary | ICD-10-CM | POA: Diagnosis not present

## 2023-12-23 SURGERY — DIALYSIS/PERMA CATHETER INSERTION
Anesthesia: Moderate Sedation

## 2023-12-24 DIAGNOSIS — D689 Coagulation defect, unspecified: Secondary | ICD-10-CM | POA: Diagnosis not present

## 2023-12-24 DIAGNOSIS — Z992 Dependence on renal dialysis: Secondary | ICD-10-CM | POA: Diagnosis not present

## 2023-12-24 DIAGNOSIS — E8770 Fluid overload, unspecified: Secondary | ICD-10-CM | POA: Diagnosis not present

## 2023-12-24 DIAGNOSIS — N2581 Secondary hyperparathyroidism of renal origin: Secondary | ICD-10-CM | POA: Diagnosis not present

## 2023-12-24 DIAGNOSIS — N186 End stage renal disease: Secondary | ICD-10-CM | POA: Diagnosis not present

## 2023-12-24 DIAGNOSIS — L299 Pruritus, unspecified: Secondary | ICD-10-CM | POA: Diagnosis not present

## 2023-12-24 DIAGNOSIS — R52 Pain, unspecified: Secondary | ICD-10-CM | POA: Diagnosis not present

## 2023-12-26 DIAGNOSIS — M79662 Pain in left lower leg: Secondary | ICD-10-CM | POA: Diagnosis not present

## 2023-12-26 DIAGNOSIS — M79661 Pain in right lower leg: Secondary | ICD-10-CM | POA: Diagnosis not present

## 2023-12-28 ENCOUNTER — Ambulatory Visit (INDEPENDENT_AMBULATORY_CARE_PROVIDER_SITE_OTHER): Admitting: Vascular Surgery

## 2023-12-28 ENCOUNTER — Encounter (INDEPENDENT_AMBULATORY_CARE_PROVIDER_SITE_OTHER)

## 2023-12-29 DIAGNOSIS — N186 End stage renal disease: Secondary | ICD-10-CM | POA: Diagnosis not present

## 2023-12-29 DIAGNOSIS — E1122 Type 2 diabetes mellitus with diabetic chronic kidney disease: Secondary | ICD-10-CM | POA: Diagnosis not present

## 2023-12-29 DIAGNOSIS — I120 Hypertensive chronic kidney disease with stage 5 chronic kidney disease or end stage renal disease: Secondary | ICD-10-CM | POA: Diagnosis not present

## 2023-12-29 DIAGNOSIS — D631 Anemia in chronic kidney disease: Secondary | ICD-10-CM | POA: Diagnosis not present

## 2023-12-29 DIAGNOSIS — N250 Renal osteodystrophy: Secondary | ICD-10-CM | POA: Diagnosis not present

## 2023-12-30 DIAGNOSIS — I120 Hypertensive chronic kidney disease with stage 5 chronic kidney disease or end stage renal disease: Secondary | ICD-10-CM | POA: Diagnosis not present

## 2023-12-30 DIAGNOSIS — N250 Renal osteodystrophy: Secondary | ICD-10-CM | POA: Diagnosis not present

## 2023-12-30 DIAGNOSIS — N186 End stage renal disease: Secondary | ICD-10-CM | POA: Diagnosis not present

## 2023-12-30 DIAGNOSIS — D631 Anemia in chronic kidney disease: Secondary | ICD-10-CM | POA: Diagnosis not present

## 2023-12-30 DIAGNOSIS — E1122 Type 2 diabetes mellitus with diabetic chronic kidney disease: Secondary | ICD-10-CM | POA: Diagnosis not present

## 2023-12-31 DIAGNOSIS — I120 Hypertensive chronic kidney disease with stage 5 chronic kidney disease or end stage renal disease: Secondary | ICD-10-CM | POA: Diagnosis not present

## 2023-12-31 DIAGNOSIS — N186 End stage renal disease: Secondary | ICD-10-CM | POA: Diagnosis not present

## 2023-12-31 DIAGNOSIS — D631 Anemia in chronic kidney disease: Secondary | ICD-10-CM | POA: Diagnosis not present

## 2023-12-31 DIAGNOSIS — N250 Renal osteodystrophy: Secondary | ICD-10-CM | POA: Diagnosis not present

## 2023-12-31 DIAGNOSIS — E1122 Type 2 diabetes mellitus with diabetic chronic kidney disease: Secondary | ICD-10-CM | POA: Diagnosis not present

## 2024-01-01 DIAGNOSIS — I2699 Other pulmonary embolism without acute cor pulmonale: Secondary | ICD-10-CM | POA: Diagnosis not present

## 2024-01-01 DIAGNOSIS — N186 End stage renal disease: Secondary | ICD-10-CM | POA: Diagnosis not present

## 2024-01-01 DIAGNOSIS — I120 Hypertensive chronic kidney disease with stage 5 chronic kidney disease or end stage renal disease: Secondary | ICD-10-CM | POA: Diagnosis not present

## 2024-01-01 DIAGNOSIS — D631 Anemia in chronic kidney disease: Secondary | ICD-10-CM | POA: Diagnosis not present

## 2024-01-02 DIAGNOSIS — N186 End stage renal disease: Secondary | ICD-10-CM | POA: Diagnosis not present

## 2024-01-02 DIAGNOSIS — D631 Anemia in chronic kidney disease: Secondary | ICD-10-CM | POA: Diagnosis not present

## 2024-01-02 DIAGNOSIS — I2699 Other pulmonary embolism without acute cor pulmonale: Secondary | ICD-10-CM | POA: Diagnosis not present

## 2024-01-02 DIAGNOSIS — I5022 Chronic systolic (congestive) heart failure: Secondary | ICD-10-CM | POA: Diagnosis not present

## 2024-01-02 DIAGNOSIS — S78111A Complete traumatic amputation at level between right hip and knee, initial encounter: Secondary | ICD-10-CM | POA: Diagnosis not present

## 2024-01-02 DIAGNOSIS — I120 Hypertensive chronic kidney disease with stage 5 chronic kidney disease or end stage renal disease: Secondary | ICD-10-CM | POA: Diagnosis not present

## 2024-01-03 DIAGNOSIS — D631 Anemia in chronic kidney disease: Secondary | ICD-10-CM | POA: Diagnosis not present

## 2024-01-03 DIAGNOSIS — I120 Hypertensive chronic kidney disease with stage 5 chronic kidney disease or end stage renal disease: Secondary | ICD-10-CM | POA: Diagnosis not present

## 2024-01-03 DIAGNOSIS — I2699 Other pulmonary embolism without acute cor pulmonale: Secondary | ICD-10-CM | POA: Diagnosis not present

## 2024-01-03 DIAGNOSIS — N186 End stage renal disease: Secondary | ICD-10-CM | POA: Diagnosis not present

## 2024-01-04 DIAGNOSIS — N186 End stage renal disease: Secondary | ICD-10-CM | POA: Diagnosis not present

## 2024-01-04 DIAGNOSIS — I120 Hypertensive chronic kidney disease with stage 5 chronic kidney disease or end stage renal disease: Secondary | ICD-10-CM | POA: Diagnosis not present

## 2024-01-04 DIAGNOSIS — D631 Anemia in chronic kidney disease: Secondary | ICD-10-CM | POA: Diagnosis not present

## 2024-01-04 DIAGNOSIS — I2699 Other pulmonary embolism without acute cor pulmonale: Secondary | ICD-10-CM | POA: Diagnosis not present

## 2024-01-05 DIAGNOSIS — D631 Anemia in chronic kidney disease: Secondary | ICD-10-CM | POA: Diagnosis not present

## 2024-01-05 DIAGNOSIS — I2699 Other pulmonary embolism without acute cor pulmonale: Secondary | ICD-10-CM | POA: Diagnosis not present

## 2024-01-05 DIAGNOSIS — I120 Hypertensive chronic kidney disease with stage 5 chronic kidney disease or end stage renal disease: Secondary | ICD-10-CM | POA: Diagnosis not present

## 2024-01-05 DIAGNOSIS — N186 End stage renal disease: Secondary | ICD-10-CM | POA: Diagnosis not present

## 2024-01-06 DIAGNOSIS — D631 Anemia in chronic kidney disease: Secondary | ICD-10-CM | POA: Diagnosis not present

## 2024-01-06 DIAGNOSIS — N186 End stage renal disease: Secondary | ICD-10-CM | POA: Diagnosis not present

## 2024-01-06 DIAGNOSIS — I120 Hypertensive chronic kidney disease with stage 5 chronic kidney disease or end stage renal disease: Secondary | ICD-10-CM | POA: Diagnosis not present

## 2024-01-06 DIAGNOSIS — I2699 Other pulmonary embolism without acute cor pulmonale: Secondary | ICD-10-CM | POA: Diagnosis not present

## 2024-01-07 DIAGNOSIS — I120 Hypertensive chronic kidney disease with stage 5 chronic kidney disease or end stage renal disease: Secondary | ICD-10-CM | POA: Diagnosis not present

## 2024-01-07 DIAGNOSIS — N186 End stage renal disease: Secondary | ICD-10-CM | POA: Diagnosis not present

## 2024-01-07 DIAGNOSIS — D631 Anemia in chronic kidney disease: Secondary | ICD-10-CM | POA: Diagnosis not present

## 2024-01-07 DIAGNOSIS — J811 Chronic pulmonary edema: Secondary | ICD-10-CM | POA: Diagnosis not present

## 2024-01-07 DIAGNOSIS — J90 Pleural effusion, not elsewhere classified: Secondary | ICD-10-CM | POA: Diagnosis not present

## 2024-01-07 DIAGNOSIS — I2699 Other pulmonary embolism without acute cor pulmonale: Secondary | ICD-10-CM | POA: Diagnosis not present

## 2024-01-07 DIAGNOSIS — R0602 Shortness of breath: Secondary | ICD-10-CM | POA: Diagnosis not present

## 2024-01-11 DIAGNOSIS — G4700 Insomnia, unspecified: Secondary | ICD-10-CM | POA: Diagnosis not present

## 2024-01-11 DIAGNOSIS — I509 Heart failure, unspecified: Secondary | ICD-10-CM | POA: Diagnosis not present

## 2024-01-11 DIAGNOSIS — E785 Hyperlipidemia, unspecified: Secondary | ICD-10-CM | POA: Diagnosis not present

## 2024-01-11 DIAGNOSIS — S81802A Unspecified open wound, left lower leg, initial encounter: Secondary | ICD-10-CM | POA: Diagnosis not present

## 2024-01-11 DIAGNOSIS — Z89611 Acquired absence of right leg above knee: Secondary | ICD-10-CM | POA: Diagnosis not present

## 2024-01-11 DIAGNOSIS — Z86711 Personal history of pulmonary embolism: Secondary | ICD-10-CM | POA: Diagnosis not present

## 2024-01-11 DIAGNOSIS — I10 Essential (primary) hypertension: Secondary | ICD-10-CM | POA: Diagnosis not present

## 2024-01-11 DIAGNOSIS — Z0001 Encounter for general adult medical examination with abnormal findings: Secondary | ICD-10-CM | POA: Diagnosis not present

## 2024-01-12 DIAGNOSIS — E872 Acidosis, unspecified: Secondary | ICD-10-CM | POA: Diagnosis not present

## 2024-01-12 DIAGNOSIS — E1122 Type 2 diabetes mellitus with diabetic chronic kidney disease: Secondary | ICD-10-CM | POA: Diagnosis not present

## 2024-01-12 DIAGNOSIS — I132 Hypertensive heart and chronic kidney disease with heart failure and with stage 5 chronic kidney disease, or end stage renal disease: Secondary | ICD-10-CM | POA: Diagnosis not present

## 2024-01-12 DIAGNOSIS — N186 End stage renal disease: Secondary | ICD-10-CM | POA: Diagnosis not present

## 2024-01-12 DIAGNOSIS — Z91158 Patient's noncompliance with renal dialysis for other reason: Secondary | ICD-10-CM | POA: Diagnosis not present

## 2024-01-12 DIAGNOSIS — E875 Hyperkalemia: Secondary | ICD-10-CM | POA: Diagnosis not present

## 2024-01-12 DIAGNOSIS — E876 Hypokalemia: Secondary | ICD-10-CM | POA: Diagnosis not present

## 2024-01-12 DIAGNOSIS — D631 Anemia in chronic kidney disease: Secondary | ICD-10-CM | POA: Diagnosis not present

## 2024-01-12 DIAGNOSIS — G9340 Encephalopathy, unspecified: Secondary | ICD-10-CM | POA: Diagnosis not present

## 2024-01-13 DIAGNOSIS — E872 Acidosis, unspecified: Secondary | ICD-10-CM | POA: Diagnosis not present

## 2024-01-13 DIAGNOSIS — Z91158 Patient's noncompliance with renal dialysis for other reason: Secondary | ICD-10-CM | POA: Diagnosis not present

## 2024-01-13 DIAGNOSIS — G9340 Encephalopathy, unspecified: Secondary | ICD-10-CM | POA: Diagnosis not present

## 2024-01-13 DIAGNOSIS — E1122 Type 2 diabetes mellitus with diabetic chronic kidney disease: Secondary | ICD-10-CM | POA: Diagnosis not present

## 2024-01-13 DIAGNOSIS — D631 Anemia in chronic kidney disease: Secondary | ICD-10-CM | POA: Diagnosis not present

## 2024-01-13 DIAGNOSIS — E875 Hyperkalemia: Secondary | ICD-10-CM | POA: Diagnosis not present

## 2024-01-13 DIAGNOSIS — I132 Hypertensive heart and chronic kidney disease with heart failure and with stage 5 chronic kidney disease, or end stage renal disease: Secondary | ICD-10-CM | POA: Diagnosis not present

## 2024-01-13 DIAGNOSIS — N186 End stage renal disease: Secondary | ICD-10-CM | POA: Diagnosis not present

## 2024-01-14 DIAGNOSIS — I132 Hypertensive heart and chronic kidney disease with heart failure and with stage 5 chronic kidney disease, or end stage renal disease: Secondary | ICD-10-CM | POA: Diagnosis not present

## 2024-01-14 DIAGNOSIS — E875 Hyperkalemia: Secondary | ICD-10-CM | POA: Diagnosis not present

## 2024-01-14 DIAGNOSIS — N186 End stage renal disease: Secondary | ICD-10-CM | POA: Diagnosis not present

## 2024-01-14 DIAGNOSIS — E1122 Type 2 diabetes mellitus with diabetic chronic kidney disease: Secondary | ICD-10-CM | POA: Diagnosis not present

## 2024-01-14 DIAGNOSIS — D631 Anemia in chronic kidney disease: Secondary | ICD-10-CM | POA: Diagnosis not present

## 2024-01-27 ENCOUNTER — Telehealth: Payer: Self-pay

## 2024-01-27 NOTE — Telephone Encounter (Signed)
 Called patient 1x to schedule his colonoscopy and was not able to leave him a voicemail. I will send him a letter to give us  a call.

## 2024-02-15 ENCOUNTER — Ambulatory Visit

## 2024-06-20 ENCOUNTER — Ambulatory Visit: Admitting: Nurse Practitioner

## 2024-06-27 ENCOUNTER — Ambulatory Visit (INDEPENDENT_AMBULATORY_CARE_PROVIDER_SITE_OTHER): Admitting: Vascular Surgery

## 2024-06-27 ENCOUNTER — Encounter (INDEPENDENT_AMBULATORY_CARE_PROVIDER_SITE_OTHER)
